# Patient Record
Sex: Female | Born: 1950
Health system: Southern US, Community
[De-identification: ages and names within clinical notes are randomized; demographics above are authoritative.]

## PROBLEM LIST (undated history)

## (undated) DIAGNOSIS — I2699 Other pulmonary embolism without acute cor pulmonale: Secondary | ICD-10-CM

## (undated) DIAGNOSIS — F329 Major depressive disorder, single episode, unspecified: Secondary | ICD-10-CM

## (undated) DIAGNOSIS — H9311 Tinnitus, right ear: Secondary | ICD-10-CM

## (undated) DIAGNOSIS — R112 Nausea with vomiting, unspecified: Secondary | ICD-10-CM

## (undated) DIAGNOSIS — F32A Depression, unspecified: Secondary | ICD-10-CM

## (undated) DIAGNOSIS — Z8709 Personal history of other diseases of the respiratory system: Secondary | ICD-10-CM

## (undated) DIAGNOSIS — T7840XA Allergy, unspecified, initial encounter: Secondary | ICD-10-CM

## (undated) DIAGNOSIS — Z9889 Other specified postprocedural states: Secondary | ICD-10-CM

## (undated) DIAGNOSIS — J383 Other diseases of vocal cords: Secondary | ICD-10-CM

## (undated) DIAGNOSIS — I2510 Atherosclerotic heart disease of native coronary artery without angina pectoris: Secondary | ICD-10-CM

## (undated) DIAGNOSIS — E88819 Insulin resistance, unspecified: Secondary | ICD-10-CM

## (undated) DIAGNOSIS — J849 Interstitial pulmonary disease, unspecified: Secondary | ICD-10-CM

## (undated) DIAGNOSIS — K449 Diaphragmatic hernia without obstruction or gangrene: Secondary | ICD-10-CM

## (undated) DIAGNOSIS — J189 Pneumonia, unspecified organism: Secondary | ICD-10-CM

## (undated) DIAGNOSIS — Z8614 Personal history of Methicillin resistant Staphylococcus aureus infection: Secondary | ICD-10-CM

## (undated) DIAGNOSIS — J679 Hypersensitivity pneumonitis due to unspecified organic dust: Secondary | ICD-10-CM

## (undated) DIAGNOSIS — C4492 Squamous cell carcinoma of skin, unspecified: Secondary | ICD-10-CM

## (undated) DIAGNOSIS — R0609 Other forms of dyspnea: Secondary | ICD-10-CM

## (undated) DIAGNOSIS — K589 Irritable bowel syndrome without diarrhea: Secondary | ICD-10-CM

## (undated) DIAGNOSIS — I341 Nonrheumatic mitral (valve) prolapse: Secondary | ICD-10-CM

## (undated) DIAGNOSIS — E782 Mixed hyperlipidemia: Secondary | ICD-10-CM

## (undated) DIAGNOSIS — R768 Other specified abnormal immunological findings in serum: Secondary | ICD-10-CM

## (undated) DIAGNOSIS — K649 Unspecified hemorrhoids: Secondary | ICD-10-CM

## (undated) DIAGNOSIS — E8881 Metabolic syndrome: Secondary | ICD-10-CM

## (undated) DIAGNOSIS — J8410 Pulmonary fibrosis, unspecified: Secondary | ICD-10-CM

## (undated) DIAGNOSIS — K760 Fatty (change of) liver, not elsewhere classified: Secondary | ICD-10-CM

## (undated) DIAGNOSIS — Z92241 Personal history of systemic steroid therapy: Secondary | ICD-10-CM

## (undated) DIAGNOSIS — K219 Gastro-esophageal reflux disease without esophagitis: Secondary | ICD-10-CM

## (undated) DIAGNOSIS — R002 Palpitations: Principal | ICD-10-CM

## (undated) DIAGNOSIS — R0600 Dyspnea, unspecified: Secondary | ICD-10-CM

## (undated) DIAGNOSIS — M1990 Unspecified osteoarthritis, unspecified site: Secondary | ICD-10-CM

## (undated) DIAGNOSIS — K4090 Unilateral inguinal hernia, without obstruction or gangrene, not specified as recurrent: Secondary | ICD-10-CM

## (undated) DIAGNOSIS — Z8669 Personal history of other diseases of the nervous system and sense organs: Secondary | ICD-10-CM

## (undated) DIAGNOSIS — J45909 Unspecified asthma, uncomplicated: Secondary | ICD-10-CM

## (undated) DIAGNOSIS — I272 Pulmonary hypertension, unspecified: Secondary | ICD-10-CM

## (undated) DIAGNOSIS — I493 Ventricular premature depolarization: Secondary | ICD-10-CM

## (undated) DIAGNOSIS — R000 Tachycardia, unspecified: Secondary | ICD-10-CM

## (undated) DIAGNOSIS — R7303 Prediabetes: Secondary | ICD-10-CM

## (undated) DIAGNOSIS — H269 Unspecified cataract: Secondary | ICD-10-CM

## (undated) DIAGNOSIS — M5134 Other intervertebral disc degeneration, thoracic region: Secondary | ICD-10-CM

## (undated) DIAGNOSIS — K635 Polyp of colon: Secondary | ICD-10-CM

## (undated) DIAGNOSIS — E7800 Pure hypercholesterolemia, unspecified: Secondary | ICD-10-CM

## (undated) HISTORY — DX: Palpitations: R00.2

## (undated) HISTORY — DX: Polyp of colon: K63.5

## (undated) HISTORY — DX: Interstitial pulmonary disease, unspecified: J84.9

## (undated) HISTORY — DX: Diaphragmatic hernia without obstruction or gangrene: K44.9

## (undated) HISTORY — PX: UPPER GI ENDOSCOPY: SHX6162

## (undated) HISTORY — DX: Other specified abnormal immunological findings in serum: R76.8

## (undated) HISTORY — PX: COLONOSCOPY: SHX174

## (undated) HISTORY — DX: Other diseases of vocal cords: J38.3

## (undated) HISTORY — DX: Ventricular premature depolarization: I49.3

## (undated) HISTORY — DX: Pure hypercholesterolemia, unspecified: E78.00

## (undated) HISTORY — DX: Unspecified cataract: H26.9

## (undated) HISTORY — PX: OTHER SURGICAL HISTORY: SHX169

## (undated) HISTORY — DX: Other forms of dyspnea: R06.09

## (undated) HISTORY — DX: Dyspnea, unspecified: R06.00

## (undated) HISTORY — DX: Personal history of other diseases of the respiratory system: Z87.09

## (undated) HISTORY — DX: Metabolic syndrome: E88.81

## (undated) HISTORY — PX: TUBAL LIGATION: SHX77

## (undated) HISTORY — DX: Insulin resistance, unspecified: E88.819

## (undated) HISTORY — DX: Allergy, unspecified, initial encounter: T78.40XA

## (undated) HISTORY — DX: Hypersensitivity pneumonitis due to unspecified organic dust: J67.9

## (undated) HISTORY — DX: Unspecified asthma, uncomplicated: J45.909

## (undated) HISTORY — DX: Unilateral inguinal hernia, without obstruction or gangrene, not specified as recurrent: K40.90

## (undated) HISTORY — PX: CATARACT EXTRACTION: SUR2

## (undated) HISTORY — DX: Mixed hyperlipidemia: E78.2

## (undated) HISTORY — DX: Tachycardia, unspecified: R00.0

## (undated) HISTORY — PX: FOOT FRACTURE SURGERY: SHX645

## (undated) HISTORY — DX: Other pulmonary embolism without acute cor pulmonale: I26.99

## (undated) HISTORY — DX: Atherosclerotic heart disease of native coronary artery without angina pectoris: I25.10

## (undated) HISTORY — PX: SQUAMOUS CELL CARCINOMA EXCISION: SHX2433

## (undated) SURGERY — BRONCHOSCOPY, WITH FLUOROSCOPY
Anesthesia: Moderate Sedation | Laterality: Bilateral

---

## 1999-03-27 ENCOUNTER — Other Ambulatory Visit: Admission: RE | Admit: 1999-03-27 | Discharge: 1999-03-27 | Payer: Self-pay | Admitting: Obstetrics and Gynecology

## 1999-11-25 ENCOUNTER — Encounter: Payer: Self-pay | Admitting: Internal Medicine

## 1999-11-25 ENCOUNTER — Ambulatory Visit (HOSPITAL_COMMUNITY): Admission: RE | Admit: 1999-11-25 | Discharge: 1999-11-25 | Payer: Self-pay | Admitting: Internal Medicine

## 2000-04-08 ENCOUNTER — Other Ambulatory Visit: Admission: RE | Admit: 2000-04-08 | Discharge: 2000-04-08 | Payer: Self-pay | Admitting: Obstetrics and Gynecology

## 2001-05-12 ENCOUNTER — Other Ambulatory Visit: Admission: RE | Admit: 2001-05-12 | Discharge: 2001-05-12 | Payer: Self-pay | Admitting: Obstetrics and Gynecology

## 2001-11-23 HISTORY — PX: BREAST BIOPSY: SHX20

## 2002-07-17 ENCOUNTER — Other Ambulatory Visit: Admission: RE | Admit: 2002-07-17 | Discharge: 2002-07-17 | Payer: Self-pay | Admitting: Obstetrics and Gynecology

## 2002-08-30 ENCOUNTER — Encounter: Admission: RE | Admit: 2002-08-30 | Discharge: 2002-08-30 | Payer: Self-pay | Admitting: General Surgery

## 2002-08-30 ENCOUNTER — Encounter (INDEPENDENT_AMBULATORY_CARE_PROVIDER_SITE_OTHER): Payer: Self-pay | Admitting: Specialist

## 2002-08-30 ENCOUNTER — Encounter: Payer: Self-pay | Admitting: General Surgery

## 2002-09-06 ENCOUNTER — Ambulatory Visit: Admission: RE | Admit: 2002-09-06 | Discharge: 2002-09-06 | Payer: Self-pay | Admitting: Pulmonary Disease

## 2002-09-18 ENCOUNTER — Ambulatory Visit: Admission: RE | Admit: 2002-09-18 | Discharge: 2002-09-18 | Payer: Self-pay | Admitting: Pulmonary Disease

## 2002-09-21 ENCOUNTER — Encounter: Payer: Self-pay | Admitting: Pulmonary Disease

## 2002-09-21 ENCOUNTER — Ambulatory Visit (HOSPITAL_COMMUNITY): Admission: RE | Admit: 2002-09-21 | Discharge: 2002-09-21 | Payer: Self-pay | Admitting: Pulmonary Disease

## 2003-02-06 ENCOUNTER — Emergency Department (HOSPITAL_COMMUNITY): Admission: EM | Admit: 2003-02-06 | Discharge: 2003-02-06 | Payer: Self-pay | Admitting: Emergency Medicine

## 2003-02-06 ENCOUNTER — Encounter: Payer: Self-pay | Admitting: Emergency Medicine

## 2003-10-01 ENCOUNTER — Encounter: Admission: RE | Admit: 2003-10-01 | Discharge: 2003-10-01 | Payer: Self-pay | Admitting: Obstetrics and Gynecology

## 2003-10-15 ENCOUNTER — Other Ambulatory Visit: Admission: RE | Admit: 2003-10-15 | Discharge: 2003-10-15 | Payer: Self-pay | Admitting: Obstetrics and Gynecology

## 2003-11-21 ENCOUNTER — Emergency Department (HOSPITAL_COMMUNITY): Admission: EM | Admit: 2003-11-21 | Discharge: 2003-11-21 | Payer: Self-pay | Admitting: Emergency Medicine

## 2004-08-07 ENCOUNTER — Inpatient Hospital Stay (HOSPITAL_COMMUNITY): Admission: EM | Admit: 2004-08-07 | Discharge: 2004-08-10 | Payer: Self-pay | Admitting: Emergency Medicine

## 2004-11-20 ENCOUNTER — Encounter: Admission: RE | Admit: 2004-11-20 | Discharge: 2004-11-20 | Payer: Self-pay | Admitting: Obstetrics and Gynecology

## 2004-11-28 ENCOUNTER — Ambulatory Visit: Payer: Self-pay | Admitting: Cardiology

## 2005-07-08 ENCOUNTER — Ambulatory Visit (HOSPITAL_BASED_OUTPATIENT_CLINIC_OR_DEPARTMENT_OTHER): Admission: RE | Admit: 2005-07-08 | Discharge: 2005-07-08 | Payer: Self-pay | Admitting: Obstetrics and Gynecology

## 2005-07-08 ENCOUNTER — Ambulatory Visit (HOSPITAL_COMMUNITY): Admission: RE | Admit: 2005-07-08 | Discharge: 2005-07-08 | Payer: Self-pay | Admitting: Obstetrics and Gynecology

## 2005-11-23 DIAGNOSIS — K635 Polyp of colon: Secondary | ICD-10-CM

## 2005-11-23 HISTORY — DX: Polyp of colon: K63.5

## 2006-01-12 ENCOUNTER — Encounter: Admission: RE | Admit: 2006-01-12 | Discharge: 2006-01-12 | Payer: Self-pay | Admitting: Obstetrics and Gynecology

## 2006-02-02 ENCOUNTER — Encounter: Admission: RE | Admit: 2006-02-02 | Discharge: 2006-02-02 | Payer: Self-pay | Admitting: Obstetrics and Gynecology

## 2006-10-27 ENCOUNTER — Ambulatory Visit: Payer: Self-pay | Admitting: Internal Medicine

## 2006-11-11 ENCOUNTER — Encounter: Payer: Self-pay | Admitting: Internal Medicine

## 2006-11-11 ENCOUNTER — Ambulatory Visit: Payer: Self-pay | Admitting: Internal Medicine

## 2007-02-10 ENCOUNTER — Encounter: Admission: RE | Admit: 2007-02-10 | Discharge: 2007-02-10 | Payer: Self-pay | Admitting: Obstetrics and Gynecology

## 2007-11-24 HISTORY — PX: BREAST BIOPSY: SHX20

## 2008-02-27 ENCOUNTER — Encounter: Admission: RE | Admit: 2008-02-27 | Discharge: 2008-02-27 | Payer: Self-pay | Admitting: Family Medicine

## 2008-03-08 ENCOUNTER — Encounter: Admission: RE | Admit: 2008-03-08 | Discharge: 2008-03-08 | Payer: Self-pay | Admitting: Family Medicine

## 2009-03-25 ENCOUNTER — Encounter: Admission: RE | Admit: 2009-03-25 | Discharge: 2009-03-25 | Payer: Self-pay | Admitting: Family Medicine

## 2009-10-15 ENCOUNTER — Encounter: Admission: RE | Admit: 2009-10-15 | Discharge: 2009-10-15 | Payer: Self-pay | Admitting: Internal Medicine

## 2010-03-27 DIAGNOSIS — I080 Rheumatic disorders of both mitral and aortic valves: Secondary | ICD-10-CM | POA: Insufficient documentation

## 2010-03-27 DIAGNOSIS — R0989 Other specified symptoms and signs involving the circulatory and respiratory systems: Secondary | ICD-10-CM

## 2010-03-27 DIAGNOSIS — F329 Major depressive disorder, single episode, unspecified: Secondary | ICD-10-CM | POA: Insufficient documentation

## 2010-03-27 DIAGNOSIS — Z8679 Personal history of other diseases of the circulatory system: Secondary | ICD-10-CM | POA: Insufficient documentation

## 2010-03-27 DIAGNOSIS — R0609 Other forms of dyspnea: Secondary | ICD-10-CM | POA: Insufficient documentation

## 2010-04-02 ENCOUNTER — Ambulatory Visit: Payer: Self-pay | Admitting: Cardiology

## 2010-04-02 DIAGNOSIS — E785 Hyperlipidemia, unspecified: Secondary | ICD-10-CM | POA: Insufficient documentation

## 2010-04-02 DIAGNOSIS — R9431 Abnormal electrocardiogram [ECG] [EKG]: Secondary | ICD-10-CM | POA: Insufficient documentation

## 2010-04-23 ENCOUNTER — Telehealth (INDEPENDENT_AMBULATORY_CARE_PROVIDER_SITE_OTHER): Payer: Self-pay | Admitting: *Deleted

## 2010-04-24 ENCOUNTER — Ambulatory Visit: Payer: Self-pay

## 2010-04-24 ENCOUNTER — Ambulatory Visit: Payer: Self-pay | Admitting: Internal Medicine

## 2010-04-24 ENCOUNTER — Encounter (HOSPITAL_COMMUNITY): Admission: RE | Admit: 2010-04-24 | Discharge: 2010-06-24 | Payer: Self-pay | Admitting: Cardiology

## 2010-05-19 ENCOUNTER — Encounter: Admission: RE | Admit: 2010-05-19 | Discharge: 2010-05-19 | Payer: Self-pay | Admitting: Family Medicine

## 2010-12-23 NOTE — Progress Notes (Signed)
Summary: Nuclear Pre-Procedure  Phone Note Outgoing Call   Call placed by: Milana Na, EMT-P,  April 23, 2010 2:21 PM Summary of Call: Reviewed information on Myoview Information Sheet (see scanned document for further details).  Busy response.     Nuclear Med Background Indications for Stress Test: Evaluation for Ischemia, Abnormal EKG   History: CT/MRI, Echo, Myocardial Perfusion Study, MVP  History Comments: '03 MPS NL EF > 70% '03 ECHO Mild MR 11/10 CT: (-) PE  Symptoms: DOE    Nuclear Pre-Procedure Cardiac Risk Factors: Lipids Height (in): 61.5  Nuclear Med Study Referring MD:  T.Wall

## 2010-12-23 NOTE — Assessment & Plan Note (Signed)
Summary: EC6/EKG,Dysnea exertion,eval for echo-mb   Visit Type:  new pt visit Primary Provider:  Nadyne Coombes  CC:  dyspnea on exertion..some chest discomfort but states she also has reflux...denies any edema.  History of Present Illness: Rhonda [name] comes in today for evaluation of increased dyspnea on exertion and cardiac risk factors.  She is referred by Dr. Nadyne Coombes.  Over the last several months, she's had increased dyspnea on exertion with walking or lifting items. She had some problems with breathing and congestion back in November of 2010. At that time a chest CT was obtained which was negative for pulmonary embolus and no other disease was noted.  She had dyspnea on exertion 2003 underwent a stress Myoview in our office. Was negative for any ischemia. Specifically, she has good exercise tolerance, adequate blood pressure response, no EKG changes to suggest any ischemia, EF was 70% with normal perfusion.  There is also a history of mitral valve thickening with mild mitral regurgitation. Last echo was in August of 2003.  She denies orthopnea, PND or peripheral edema. Her dyspnea on exertion does not occur when she swims which is remarkable.  She has risk factors of age, mixed hyperlipidemia, obesity.   Current Medications (verified): 1)  Metoprolol Succinate 50 Mg Xr24h-Tab (Metoprolol Succinate) .Marland Kitchen.. 1 Tab Once Daily 2)  Prozac 20 Mg Caps (Fluoxetine Hcl) .Marland Kitchen.. 1 Tab Once Daily 3)  Simvastatin 40 Mg Tabs (Simvastatin) .Marland Kitchen.. 1 Tab At Bedtime 4)  Singulair 10 Mg Tabs (Montelukast Sodium) .Marland Kitchen.. 1 Tab At Bedtime 5)  Omeprazole 20 Mg Tbec (Omeprazole) .Marland Kitchen.. 1 Tab Once Daily 6)  Levocetirizine Dihydrochloride 5 Mg Tabs (Levocetirizine Dihydrochloride) .Marland Kitchen.. 1 Tab As Needed 7)  Pravastatin Sodium 40 Mg Tabs (Pravastatin Sodium) .Marland Kitchen.. 1 Tab At Bedtime 8)  Asmanex 60 Metered Doses 220 Mcg/inh Aepb (Mometasone Furoate) .... As Needed 9)  Proair Hfa 108 (90 Base) Mcg/act Aers  (Albuterol Sulfate) .... As Needed 10)  Dry Eye Relief .... Use As Directed  Allergies (verified): 1)  ! Betadine 2)  ! Codeine 3)  ! Sulfa 4)  ! Crestor (Rosuvastatin Calcium) 5)  ! Lipitor (Atorvastatin)  Past History:  Past Medical History: Last updated: 03/27/2010 DYSPNEA ON EXERTION (ICD-786.09) TACHYCARDIA, HX OF (ICD-V12.50) MITRAL VALVE PROLAPSE, HX OF (ICD-V12.50) MITRAL REGURGITATION (ICD-396.3) HYPERTRIGLYCERIDEMIA (ICD-272.1) DEPRESSION (ICD-311)    Past Surgical History: Last updated: 03/27/2010 Open reduction, internal fixation of lateral malleolus and  repair of deltoid ligament. SURGEON:  Claude Manges. Cleophas Dunker, MD  Review of Systems       negative other than history of present illness  Vital Signs:  Patient profile:   60 year old female Height:      61.5 inches Weight:      163 pounds BMI:     30.41 Pulse rate:   66 / minute Pulse rhythm:   irregular BP sitting:   110 / 70  (left arm) Cuff size:   large  Vitals Entered By: Danielle Rankin, CMA (Apr 02, 2010 2:32 PM)  Physical Exam  General:  obese.  obese.   Head:  normocephalic and atraumatic Eyes:  PERRLA/EOM intact; conjunctiva and lids normal. Mouth:  Teeth, gums and palate normal. Oral mucosa normal. Neck:  Neck supple, no JVD. No masses, thyromegaly or abnormal cervical nodes. Chest Theus Espin:  no deformities or breast masses noted Lungs:  inspiratory rhonchi that clear with cough. Heart:  PMI nondisplaced, normal S1-S2, no obvious click, soft systolic murmur at the apex. Abdomen:  Bowel sounds  positive; abdomen soft and non-tender without masses, organomegaly, or hernias noted. No hepatosplenomegaly. Msk:  Back normal, normal gait. Muscle strength and tone normal. Pulses:  pulses normal in all 4 extremities Extremities:  No clubbing or cyanosis. Neurologic:  Alert and oriented x 3. Skin:  Intact without lesions or rashes. Psych:  Normal affect.   Problems:  Medical Problems Added: 1)  Dx of  Abnormal Ekg  (ICD-794.31) 2)  Dx of Hyperlipidemia-mixed  (ICD-272.4)  EKG  Procedure date:  04/02/2010  Findings:      normal sinus rhythm, ST depression with biphasic T waves in 23 aVF V3 through V5. These changes are new.  Impression & Recommendations:  Problem # 1:  DYSPNEA ON EXERTION (ICD-786.09) Assessment Deteriorated  Her symptoms are worse than they were in the past. She has an abnormal EKG which is new. She also has several cardiac risk factors it put her in the low to medium coronary risk score. We will perform a stress Myoview. The following medications were removed from the medication list:    Aspirin Ec 325 Mg Tbec (Aspirin) .Marland Kitchen... Take one tablet by mouth daily Her updated medication list for this problem includes:    Metoprolol Succinate 50 Mg Xr24h-tab (Metoprolol succinate) .Marland Kitchen... 1 tab once daily  Orders: Nuclear Stress Test (Nuc Stress Test)  Problem # 2:  MITRAL VALVE PROLAPSE, HX OF (ICD-V12.50) Assessment: Unchanged  Problem # 3:  MITRAL REGURGITATION (ICD-396.3) Assessment: Unchanged  Problem # 4:  HYPERLIPIDEMIA-MIXED (ICD-272.4)  The following medications were removed from the medication list:    Simvastatin 40 Mg Tabs (Simvastatin) .Marland Kitchen... 1 tab at bedtime Her updated medication list for this problem includes:    Pravastatin Sodium 40 Mg Tabs (Pravastatin sodium) .Marland Kitchen... 1 tab at bedtime  Problem # 5:  ABNORMAL EKG (ICD-794.31) Assessment: New  she That has ST segment depression with T wave inversion in 3 and aVF as well as V3 through V5 which is new since her last EKG. Couple with her dyspnea on exertion, cardiac risk factors, and these changes I think she certainly needs at least a nuclear stress study. The following medications were removed from the medication list:    Aspirin Ec 325 Mg Tbec (Aspirin) .Marland Kitchen... Take one tablet by mouth daily Her updated medication list for this problem includes:    Metoprolol Succinate 50 Mg Xr24h-tab (Metoprolol  succinate) .Marland Kitchen... 1 tab once daily  Orders: EKG w/ Interpretation (93000)  Patient Instructions: 1)  Your physician recommends that you schedule a follow-up appointment in: AS NEEDED 2)  Your physician recommends that you continue on your current medications as directed. Please refer to the Current Medication list given to you today. 3)  Your physician has requested that you have an exercise stress myoview.  For further information please visit [email or web address]  Please follow instruction sheet, as given.

## 2010-12-23 NOTE — Assessment & Plan Note (Signed)
Summary: Cardiology Nuclear Study  Nuclear Med Background Indications for Stress Test: Evaluation for Ischemia, Abnormal EKG   History: CT/MRI, Echo, Myocardial Perfusion Study, MVP  History Comments: '03 MPS NL EF > 70% '03 ECHO Mild MR 11/10 CT: (-) PE  Symptoms: DOE, Palpitations    Nuclear Pre-Procedure Cardiac Risk Factors: Lipids Caffeine/Decaff Intake: None NPO After: 9:00 PM Lungs: clear IV 0.9% NS with Angio Cath: 20g     IV Site: (R) AC IV Started by: Irean Hong RN Chest Size (in) 38     Cup Size B     Height (in): 61.5 Weight (lb): 158 BMI: 29.48 Tech Comments: This patient was scheduled to walk the treadmill, and she did. She had a difficult time with shortness of breath and had to be switched to [employer].   Nuclear Med Study 1 or 2 day study:  1 day     Stress Test Type:  Eugenie Birks Reading MD:  Arvilla Meres, MD     Referring MD:  T.Wall Resting Radionuclide:  Technetium 71m Tetrofosmin     Resting Radionuclide Dose:  11 mCi  Stress Radionuclide:  Technetium 46m Tetrofosmin     Stress Radionuclide Dose:  33 mCi   Stress Protocol Exercise Time (min):  6:45 min     Max HR:  122 bpm     Predicted Max HR:  159 bpm  Max Systolic BP: 151 mm Hg     Percent Max HR:  76.73 %     METS: 8.10 Rate Pressure Product:  14782  Lexiscan: 0.4 mg   Stress Test Technologist:  Milana Na EMT-P     Nuclear Technologist:  Domenic Polite CNMT  Rest Procedure  Myocardial perfusion imaging was performed at rest 45 minutes following the intravenous administration of Myoview Technetium 93m Tetrofosmin.  Stress Procedure  The patient received IV Lexiscan 0.4 mg over 15-seconds.  Myoview injected at 30-seconds.  There were non specific changes with infusion.  Quantitative spect images were obtained after a 45 minute delay.  QPS Raw Data Images:  Normal; no motion artifact; normal heart/lung ratio. Stress Images:  There is normal uptake in all areas. Rest Images:  Normal  homogeneous uptake in all areas of the myocardium. Subtraction (SDS):  Normal Transient Ischemic Dilatation:  1  (Normal <1.22)  Lung/Heart Ratio:  .41  (Normal <0.45)  Quantitative Gated Spect Images QGS EDV:  57 ml QGS ESV:  14 ml QGS EF:  76 % QGS cine images:  Normal  Findings Normal nuclear study      Overall Impression  Exercise Capacity: Lexiscan study with no exercise. ECG Impression: Baseline: NSR; No significant ST segment change with.Lexiscan Overall Impression: Normal stress nuclear study. Overall Impression Comments: Patient walked on treadmill for 6:45 seconds. + Dyspnea. Unable to reach target HR so switched to Lexiscan.  Appended Document: Cardiology Nuclear Study Good. Dyspnea not cardiac.   Appended Document: Cardiology Nuclear Study PT AWARE./CY

## 2011-03-26 ENCOUNTER — Emergency Department (HOSPITAL_COMMUNITY)
Admission: EM | Admit: 2011-03-26 | Discharge: 2011-03-26 | Disposition: A | Discharge status: Home / Self Care | Payer: No Typology Code available for payment source | Attending: Emergency Medicine | Admitting: Emergency Medicine

## 2011-03-26 DIAGNOSIS — M25519 Pain in unspecified shoulder: Secondary | ICD-10-CM | POA: Insufficient documentation

## 2011-03-26 DIAGNOSIS — IMO0002 Reserved for concepts with insufficient information to code with codable children: Secondary | ICD-10-CM | POA: Insufficient documentation

## 2011-03-26 DIAGNOSIS — E785 Hyperlipidemia, unspecified: Secondary | ICD-10-CM | POA: Insufficient documentation

## 2011-03-26 DIAGNOSIS — Z79899 Other long term (current) drug therapy: Secondary | ICD-10-CM | POA: Insufficient documentation

## 2011-03-26 DIAGNOSIS — T07XXXA Unspecified multiple injuries, initial encounter: Secondary | ICD-10-CM | POA: Insufficient documentation

## 2011-04-10 NOTE — Consult Note (Signed)
NAME:  Eichelberger, Kalii A                       ACCOUNT NO.:  [id number]   MEDICAL RECORD NO.:  [id number]                   PATIENT TYPE:  OUT   LOCATION:  [facility]                                  FACILITY:  [facility]   PHYSICIAN:  Willa Rough, M.D.                  DATE OF BIRTH:  04-09-1951   DATE OF CONSULTATION:  DATE OF DISCHARGE:                                   CONSULTATION   REFERRING PHYSICIAN:  Claude Manges. Cleophas Dunker, M.D.   REASON FOR CONSULTATION:  The patient is a 60 year old female, with no known  history of coronary artery disease, remote history of tachycardia, with  normal left ventricular function by echocardiogram  in 2003, now referred  for evaluation of abnormal electrocardiogram.  The patient is status post  right ankle fracture and is scheduled for surgery later today.   The patient denies any prior history of exertional chest discomfort.  She  does, however, have chronic exertional dyspnea but no orthopnea, PND, or  lower extremity edema.  She underwent full evaluation by Dr. Sung Amabile in  2003, after presenting status post syncope and for evaluation of progressive  dyspnea.  She was referred for an echocardiogram  which revealed normal left  ventricular function and mild mitral regurgitation with mild mitral valve  prolapse.  She also underwent pulmonary function testing and was The patient  tolerated the procedure by Dr. Sung Amabile that essentially everything was  negative.   Regarding her history of palpitations, these have been well controlled over  the past 10 years or so on low dose metoprolol.  Regarding her remote  episode, she was evaluated by Dr. Aggie Cosier who concluded that this was  pregnancy-induced.   The preoperative electrocardiogram did suggest a poor R-wave progression.   ALLERGIES:  1.  CODEINE.  2.  SULFA.  3.  IODINE.  4.  BETADINE.   MEDICATIONS PRIOR TO ADMISSION:  1.  Metoprolol 25 mg daily.  2.  Crestor 10 mg daily.  3.  Prozac 20 mg  daily.  4.  Singulair 10 mg q.h.s.   PAST MEDICAL HISTORY:  1.  Remote tachycardia.  2.  Status post syncopal episode with negative workup.  3.  Normal left ventricular function.  4.  Mild mitral regurgitation.  5.  Hypertriglyceridemia.  6.  Question asthma.  7.  Depression.  8.  Status post C. section in 1985.  9.  Tubal ligation in 1989.   SOCIAL HISTORY:  The patient lives in [place], [place], with her  husband.  They have one child.  She is a Doctor, general practice.  The patient  has nerve smoked tobacco and drinks alcohol only on rare occasion.   FAMILY HISTORY:  Father deceased age 87, secondary to cancer.  Mother age  72, resides in a nursing home and has dementia.  Sister, age 42, history of  hypertension.  There is no family history of coronary  artery disease.   REVIEW OF SYMPTOMS:  As noted in HPI.  The patient does have occasional  tinnitus.  Also occasional heartburn.  Remaining systems negative.   PHYSICAL EXAMINATION:  GENERAL APPEARANCE:  A 60 year old female in no  apparent distress.  VITAL SIGNS:  Blood pressure 115/66, pulse 78 and regular, respiratory rate  18, temperature 98.6, SAO2 96 (room air).  HEENT:  Normocephalic and atraumatic.  Xanthelasma noted.  LUNGS:  Clear to auscultation in all fields.  CARDIOVASCULAR:  Regular rate and rhythm (S1 and S2).  No murmurs, rubs, or  gallops.  ABDOMEN:  Soft, nontender with intact bowel sounds.  EXTREMITIES:  Preserved right popliteal with lower extremity bandage;  preserved distal pulses with no edema on the left.  NEUROLOGIC:  No focal deficit.   Preoperative electrocardiogram reveals normal sinus rhythm at 70 beats per  minute with normal axis and poor R-wave progression with a small R-wave in  V1 and absence of V2.  Repeat electrocardiogram, after repositioning of  leads, reveals normal R-wave progression with nonspecific ST abnormalities  (noted in 2003).   IMPRESSION:  A 60 year old female, with  no known history of coronary artery  disease, normal left ventricular function by echocardiogram  in 2003, who is  now status post right ankle fracture following a fall, and is referred for  preoperative cardiac clearance.  Initial electrocardiogram  suggested poor R-  wave by transition and suggestion of a previous septal infarct.  However,  repeat electrocardiogram, following repositioning of the leads, shows normal  R-wave progression and old, nonspecific ST abnormalities noted on previous  electrocardiogram.   More over, the patient presents with no recent or current symptoms  suggestive of unstable angina pectoris or congestive heart failure.   PLAN:  The patient is cleared to proceed with surgery later today, and no  further cardiac recommendations or work-up is warranted at this time. The  patient was seen and examined in conjunction with Dr. Willa Rough.     Gene Serpe, P.A. [facility]                      Willa Rough, M.D.    GS/MEDQ  D:  08/08/2004  T:  08/08/2004  Job:  [id number]

## 2011-04-10 NOTE — Op Note (Signed)
NAME:  Hlavac, Gissela             ACCOUNT NO.:  [id number]   MEDICAL RECORD NO.:  [id number]          PATIENT TYPE:  AMB   LOCATION:  [facility]                         FACILITY:  [facility]   PHYSICIAN:  Sherry A. Dickstein, M.D.DATE OF BIRTH:  01-14-1951   DATE OF PROCEDURE:  07/08/2005  DATE OF DISCHARGE:                                 OPERATIVE REPORT   PREOPERATIVE DIAGNOSES:  Tight hymenal band.   POSTOPERATIVE DIAGNOSES:  Tight hymenal band.   PROCEDURE:  Hymenotomy.   SURGEON:  Sherry A. Rosalio Macadamia, M.D.   ANESTHESIA:  General.   INDICATIONS FOR PROCEDURE:  This is a 60 year old, G1, P1-0-0-1 woman who  has had significant dyspareunia which has been getting worse since she has  been postmenopausal. The patient has a very tight hymenal band which has  been causing significant pain. Because of the discomfort and the tightening  of the hymenal band, the patient requests surgical intervention and the  patient is brought to the operating room for hymenotomy.   FINDINGS:  Very tight intact hymenal band.   DESCRIPTION OF PROCEDURE:  The patient is brought into the operating room  and given adequate general anesthesia. She was placed in the dorsal  lithotomy position, her perineum and vagina were washed with Hibiclens. The  patient was draped in a sterile fashion. The introitus was injected with  0.25% Marcaine with epinephrine circumferentially around the hymenal band.  Using double clamps, hemostats at 2o'clock, 4 o'clock, 8 o'clock and 10  o'clock, the hemostats were clamped, cut, suture ligated with 3-0 chromic  and then any further clamping, cutting and suturing was performed to assure  that the hymenal band would open adequately. Small bleeders were cauterized.  The base of the incised areas were then infiltrated with 0.25% Marcaine with  epinephrine. Small areas were also clamped and cut and suture ligated for  further opening. Approximately 2-3 fingers were able to be inserted  after  this procedure. Estrace cream was then inserted around the introitus,  adequate hemostasis was present. The patient was taken out of the dorsal  lithotomy position, she was awakened. She was extubated, she was moved from  the operating table to a stretcher in stable condition. Complications were  none. Estimated blood loss less than 5 mL.      Sherry A. Rosalio Macadamia, M.D.  Electronically Signed     SAD/MEDQ  D:  07/08/2005  T:  07/08/2005  Job:  [id number]

## 2011-04-10 NOTE — Discharge Summary (Signed)
Rhonda [name], Rhonda [name]             ACCOUNT NO.:  [id number]   MEDICAL RECORD NO.:  [id number]          PATIENT TYPE:  INP   LOCATION:  5033                         FACILITY:  [facility]   PHYSICIAN:  Claude Manges. Whitfield, M.D.DATE OF BIRTH:  03/05/1951   DATE OF ADMISSION:  08/07/2004  DATE OF DISCHARGE:  08/10/2004                                 DISCHARGE SUMMARY   ADMISSION DIAGNOSES:  1.  Right ankle bimalleolar fracture.  2.  History of tachycardia.  3.  History of mitral valve prolapse.   DISCHARGE DIAGNOSES:  1.  Right ankle open reduction and internal fixation.  2.  History of remote cardiac tachycardia.  3.  History of status post syncopal episode with a negative workup.  4.  Normal left ventricular function.  5.  Mild mitral valve regurgitation.  6.  Hypertriglyceridemia.  7.  Depression.   HISTORY OF PRESENT ILLNESS:  The patient is a 60 year old female who, on  date of admission, was out walking around at her house when she missed a  step, twisting her ankle, falling to the ground, having pain and deformity  of her right ankle.  She was brought to initially [facility] ER for  evaluation and was found to have a bimalleolar ankle fracture.  Due to  inaccessibility to the OR for surgical care and possible delay, the patient  was transferred to [facility] for ORIF.  The patient was also  preoperatively evaluated from the cardiac standpoint and cleared.   CURRENT MEDICATIONS:  1.  Toprol 25 mg p.o. daily.  2.  Crestor 10 mg p.o. daily.  3.  Prozac 20 mg p.o. daily.  4.  Singulair 10 mg p.o. nightly.   ALLERGIES:  1.  BETADINE.  2.  IODINE.  3.  SULFA.  4.  CODEINE.   CONSULTS:  The following routine consults were requested:  1.  Physical therapy.  2.  Occupational therapy.  3.  A cardiology consult by [facility] was performed for a      preoperative cardiac clearance.   HOSPITAL COURSE:  On August 07, 2004, the patient was seen in the [facility] Emergency Room for evaluation of her right ankle fracture.  The patient  was found to have a bimalleolar fracture.  The patient was to be admitted  and due to unavailability of OR time and the lack of delay, the patient was  transferred to [facility] for surgical ORIF.  The patient was also  preoperatively evaluated by cardiology services with [place] for cardiac  clearance.  The patient was cleared and the patient was then taken to the OR  for an ORIF of her right ankle.   The patient then incurred 2 days of postoperative care on the orthopedic  floor in which the patient had no untoward events, no complications, no  cardiac complications.  Her vital signs remained stable.  She remained  afebrile.  Her right lower extremity pain was well-controlled with p.o. pain  medications.  Her toes remained neuromotor and vascular intact and the  patient worked well with physical therapy.  It was  felt on postop day #2,  after working with physical therapy, the patient was ready for discharge to  home, so she was discharged home in good condition.   SURGICAL PROCEDURE:  On August 08, 2004, the patient was taken to the OR  by Dr. Claude Manges. Whitfield, assisted by Richardean Canal, P.A., and had a  trimalleolar fracture dislocation of the right ankle repaired with an open  reduction and internal fixation.  The patient tolerated the procedure well.  It was done under general anesthesia with peroneal nerve block, no  complications.  The patient was transferred to the orthopedic floor in good  condition.   LABORATORIES:  EKG on admission showed normal sinus rhythm, questionable  septal infarct, age undetermined, possible lateral infarct, age  undetermined, abnormal EKG at 70 beats per minute with a PRT axes of 39, 37  and 50.   Chest x-ray showed no active disease.   Three-view right ankle shows trimalleolar fracture with dislocation of the  ankle.   CBC on admission showed WBC 12.9,  hemoglobin 14.8, hematocrit 43.7,  platelets 234,000.  INR of 0.9.  Routine chemistries:  Sodium 139, potassium  of 3.8, glucose of 118, BUN 13, creatinine 1.0.   MEDICATIONS UPON DISCHARGE FROM ORTHOPEDIC FLOOR:  1.  Patient was initially managed on a Dilaudid PCA, but transitioned over      to p.o. medications.  2.  Lopressor 25 mg p.o. daily.  3.  Prozac 20 mg p.o. daily.  4.  Zocor 40 mg p.o. daily.  5.  Singulair 10 mg p.o. nightly.  6.  Phenergan 25 mg p.o. or IV q.6 h. p.r.n.  7.  Tylenol 650 mg p.o. q.6 h. p.r.n.  8.  Percocet 1 or 2 tablets every 4-6 hours p.r.n.  9.  Benadryl 25 mg to 50 mg p.o. q.6 h. p.r.n. itching.   DISCHARGE INSTRUCTIONS:   MEDICATIONS:  The patient is to resume routine home medications with the  addition of the following:  1.  Percocet 7.5 mg 1 or 2 tablets every 4-6 hours p.r.n. pain.  2.  Aspirin 325 mg once a day for 30 days.   ACTIVITY:  The patient is to nonweightbearing on the right leg with the use  of a walker.   DIET:  No restrictions.   WOUND CARE:  Keep the splint clean and dry.   FOLLOWUP:  The patient is to have a followup appointment with Dr. Cleophas Dunker  in his office in approximately 5 days; please call [phone] for followup  appointment.   CONDITION UPON DISCHARGE:  The patient's condition upon discharge to home is  listed as improved and good.       RWK/MEDQ  D:  09/25/2004  T:  09/25/2004  Job:  [id number]

## 2011-04-10 NOTE — Op Note (Signed)
NAME:  Rhonda [name], Rhonda [name]                       ACCOUNT NO.:  [id number]   MEDICAL RECORD NO.:  [id number]                   PATIENT TYPE:  OIB   LOCATION:  5033                                 FACILITY:  [facility]   PHYSICIAN:  Claude Manges. Cleophas Dunker, M.D.            DATE OF BIRTH:  05-12-1951   DATE OF PROCEDURE:  DATE OF DISCHARGE:  08/10/2004                                 OPERATIVE REPORT   DATE OF OPERATION:  August 08, 2004.   PREOPERATIVE DIAGNOSIS:  Displaced trimalleolar fracture/dislocation, right  ankle.   POSTOPERATIVE DIAGNOSIS:  Displaced trimalleolar fracture/dislocation, right  ankle.   PROCEDURE:  Open reduction, internal fixation of lateral malleolus and  repair of deltoid ligament.   SURGEON:  Claude Manges. Cleophas Dunker, MD.   ASSISTANT:  Richardean Canal, PA-C.   ANESTHESIA:  General with peroneal nerve block.   COMPLICATIONS:  None.   PROCEDURE:  With the patient comfortable on the operating table with [name]  peroneal nerve block and initially with IV sedation, the right lower  extremity was placed in [name] thigh tourniquet.  The leg was then prepped with  Hibiclens, as the patient is ALLERGIC TO DURAPREP, from the tips of the toes  to the tourniquet.  Sterile draping was performed.  With the extremity still  elevated, it was esmarched and exsanguinated over the proximal tourniquet at  350 mmHg.  The patient was uncomfortable at that point.  Accordingly,  general anesthesia was instituted.   X-rays revealed [name] posterior dislocation of the ankle joint in relationship  to the distal tibia.  There was [name] very small fragment of posterior plafond,  and just an area of calcification of the deltoid ligament tear from the  medial malleolus, there was considerable displacement of [name] lateral malleolus  fracture.   Initial procedure involved [name] longitudinal incision over the lateral  malleolus about 3 inches in length.  Via sharp dissection, incision carried  down through the  subcutaneous tissue.  Then via blunt dissection, the soft  tissue was elevated off the tibia.  There was abundant hematoma that was  irrigated.  Under direct visualization, the lateral malleolus fracture was  reduced.  The fracture extended from proximally and posteriorly to  anteriorly and distally.  The fracture was above the joint line.  Under  direct visualization, the fracture was reduced and maintained with [name] bone  clamp.  [name] single 3.5 mm cortical screw was then placed at [name] 9 degree angle  of the fracture.  After appropriate drilling, measuring, and filling with  self-tapping cortical screw, there was [name] very nice purchase.  The bone clamp  was maintained in place.  [name] 7-hole semi-tubular Synthes plate was then bent  to fashion to the lateral malleolus.  There were 4 screw holes above the  fracture and 3 distal.  Each of the 4 holes proximally were drilled,  measured, and filled with self-tapping cortical screws.  In [name] similar  fashion, the distal 3 holes were drilled, measured, and filled with fully  threaded cancellous screws.  Image intensification at that point revealed  excellent position of the plate and normal ankle mortise.  The wound was  then irrigated with saline solution.  The fascia was closed with [name] 3-0  Vicryl.  The skin was then closed with skin clips.   An oblique incision about an inch in length was then made directly over the  deltoid ligament.  Via sharp dissection, the incision was carried down  through subcutaneous tissue.  Then via blunt dissection, the deltoid  ligament was identified.  There were some areas that were torn and  retracted, and these were placed in anatomic position.  The posterior tibial  tendon was identified and sheath and was intact and not dislocated.  At that  point, I felt we had [name] very stable construct.  The wound was irrigated with  saline, and the skin closed with skin clips.  [name] sterile, bulky dressing was  applied.  Final image  intensification and films in the AP and lateral  projection were obtained and were in excellent position.  [name] sterile, bulky  dressing was applied followed by posterior splints and an Ace bandage.  The  tourniquet was deflated, with immediate capillary refill to the toes.   The patient tolerated the procedure without complications.      PWW/MEDQ  D:  08/08/2004  T:  08/10/2004  Job:  [id number]

## 2011-04-30 ENCOUNTER — Other Ambulatory Visit: Payer: Self-pay | Admitting: Family Medicine

## 2011-04-30 DIAGNOSIS — Z1231 Encounter for screening mammogram for malignant neoplasm of breast: Secondary | ICD-10-CM

## 2011-07-10 ENCOUNTER — Ambulatory Visit
Admission: RE | Admit: 2011-07-10 | Discharge: 2011-07-10 | Disposition: A | Discharge status: Home / Self Care | Payer: BC Managed Care – PPO | Source: Ambulatory Visit | Attending: Family Medicine | Admitting: Family Medicine

## 2011-07-10 DIAGNOSIS — Z1231 Encounter for screening mammogram for malignant neoplasm of breast: Secondary | ICD-10-CM

## 2011-08-19 ENCOUNTER — Observation Stay (HOSPITAL_COMMUNITY)
Admission: AD | Admit: 2011-08-19 | Discharge: 2011-08-20 | Disposition: A | Discharge status: Home / Self Care | Payer: BC Managed Care – PPO | Source: Ambulatory Visit | Attending: Family Medicine | Admitting: Family Medicine

## 2011-08-19 ENCOUNTER — Encounter: Payer: Self-pay | Admitting: Family Medicine

## 2011-08-19 ENCOUNTER — Observation Stay (HOSPITAL_COMMUNITY): Payer: BC Managed Care – PPO

## 2011-08-19 DIAGNOSIS — K219 Gastro-esophageal reflux disease without esophagitis: Secondary | ICD-10-CM

## 2011-08-19 DIAGNOSIS — R062 Wheezing: Secondary | ICD-10-CM | POA: Insufficient documentation

## 2011-08-19 DIAGNOSIS — R059 Cough, unspecified: Secondary | ICD-10-CM | POA: Insufficient documentation

## 2011-08-19 DIAGNOSIS — R0602 Shortness of breath: Secondary | ICD-10-CM

## 2011-08-19 DIAGNOSIS — F329 Major depressive disorder, single episode, unspecified: Secondary | ICD-10-CM

## 2011-08-19 DIAGNOSIS — E785 Hyperlipidemia, unspecified: Secondary | ICD-10-CM

## 2011-08-19 DIAGNOSIS — R05 Cough: Secondary | ICD-10-CM | POA: Insufficient documentation

## 2011-08-19 LAB — COMPREHENSIVE METABOLIC PANEL
Alkaline Phosphatase: 79 U/L (ref 39–117)
Calcium: 9.4 mg/dL (ref 8.4–10.5)
Chloride: 105 mEq/L (ref 96–112)
Creatinine, Ser: 0.81 mg/dL (ref 0.50–1.10)
GFR calc non Af Amer: >60 mL/min (ref >60)

## 2011-08-19 LAB — DIFFERENTIAL
Eosinophils Absolute: 0 10*3/uL (ref 0.0–0.7)
Monocytes Absolute: 0.2 10*3/uL (ref 0.1–1.0)
Monocytes Relative: 2 % — ABNORMAL LOW (ref 3–12)
Neutro Abs: 8.3 10*3/uL — ABNORMAL HIGH (ref 1.7–7.7)

## 2011-08-19 LAB — CBC
MCH: 29.9 pg (ref 26.0–34.0)
MCV: 88.6 fL (ref 78.0–100.0)
RBC: 4.99 MIL/uL (ref 3.87–5.11)
RDW: 14.2 % (ref 11.5–15.5)

## 2011-08-19 NOTE — H&P (Signed)
Family Medicine Teaching [facility] Admission History and Physical  Patient name: Rhonda [name] Medical record number: [id number] Date of birth: 1951-02-14 Age: 60 y.o. Gender: female  Primary [name] Provider: [name]  Chief Complaint: Cough and shortness of breath History of Present Illness: Rhonda [name] is a 60 y.o. year old female with a history of chronic dyspnea on exertion presenting with cough and shortness of breath. Symptoms began 4 days ago and initially included a sore throat, productive cough, and shortness of breath. Her cough has produced brown/yellow sputum.  Pt was seen at [name] on 9/24 and was started on Avelox. Last night pt began to feel that she could not fill her lungs with air, and had difficulty coughing up sputum. She returned to [place] today for a follow-up appointment and apparently had oxygen saturations in the low 90s on room air, so [facility] opted to have her admitted to the hospital. At her baseline, patient is able to swim and walk on flat levels, but gets significantly short of breath when walking up an incline. Of note patient has been on and off prednisone since August after having vertebral disc pain and a bout of poison ivy.  Past Medical History: Seasonal/environmental allergies Lactose intolerance GERD Idiopathic tachycardia Depression Mitral regurgitation Hyperlipidemia Insulin resistance treated with diet/exercise Pelvic organ prolapse (not requiring intervention)  Past Surgical History: Orthopedic repair of broken foot 6 years ago Cesarean section Tubal ligation  Social History: Patient eats a healthy diet and swims several times per week. She lives with her husband and feels safe at home. She works as a Human resources officer. She smoked cigarettes occasionally in college, but has no other smoking history. Drinks a glass of wine 1-2x/week. Occasional marijuana use in the distant past, but no history of IV drug use or any  present illicit drug use.  Family History: Non contributory  Allergies: Shellfish - nausea/vomiting/diarrhea Betadine - blistering Codeine - vomiting Sulfa drugs - unknown reaction Atorvastatin - unknown Rosuvastatin - unknown  Outpatient Medications: Metoprolol Prilosec Fluoxetine q2-3 days Albuterol Levocetirizine Singulair Asthmanex  Review Of Systems: As per HPI and unremarkable except for: recent headache when coughing, diffuse chest pain for several days recently (possibly due to coughing). Otherwise 12 point review of systems was performed and was unremarkable.  Physical Exam: Pulse: 69  Blood Pressure: 135/77 RR: 20   O2: 95% on RA Temp: 97.5  General: Awake, alert, in NAD. HEENT:MMM, no oral exudates or ulcers. Heart: RRR Lungs: Diffuse inspiratory and expiratory wheezes, and ronchi. Prolonged expiratory phase. Normal work of breathing. Abdomen:Soft, nontender, nondistended. Positive bowel sounds. Extremities:Lower extremities nontender, nonerythematous, non edematous. 2+ dorsal pedis pulses bilaterally. Skin: No obvious rashes. Neurology: A&Ox3, nonfocal neuro exam.  Labs and Imaging: None  Assessment and Plan: Rhonda [name] is a 60 y.o. year old female with a history of chronic dyspnea on exertion presenting with 4 days of productive cough and shortness of breath, who had oxygen saturation in the low 90s at her outpatient provider's office today. 1. Shortness of breath/cough: This appears to be an exacerbation of a chronic lung condition, most likely undiagnosed COPD vs other chronic pulmonary disease. Will admit patient to the floor under observation status, and give albuterol nebs q4h, with q2h prn. Will continue Avelox 400mg  QD and prednisone 50mg  QD. Patient is satting at 95% on room air and thus does not require oxygen therapy at this time. Will monitor vital signs and reassess need for oxygen. Rhonda [name]  could benefit from pulmonary function testing as  an outpatient when she is at her baseline. Will order 2-view CXR, CBC with diff, and CMP now. 2. FEN/GI: Heparin lock IV, regular diet. 3. Prophylaxis: Heparin 5000u SQ TID, protonix 40mg  QD. 4. Disposition: observation status.

## 2011-08-31 NOTE — H&P (Signed)
Rhonda [name], Rhonda [name]             ACCOUNT NO.:  [id number]  MEDICAL RECORD NO.:  [id number]  LOCATION:                                 FACILITY:  PHYSICIAN:  Clementeen Graham, MD         DATE OF BIRTH:  08-30-1951  DATE OF ADMISSION: DATE OF DISCHARGE:                             HISTORY & PHYSICAL   PRIMARY CARE PROVIDER:  [facility].  CHIEF COMPLAINT:  Cough and shortness of breath.  HISTORY OF PRESENT ILLNESS:  Rhonda [name] is a 60 year old female with history of chronic dyspnea on exertion, presenting with cough and shortness of breath.  Symptoms began 4 days and initially included a sore throat, productive cough, and shortness of breath.  Her cough has produced a brown and yellow sputum.  The patient was seen at [facility] on September, 24, 2012, and was started on Avelox.  Last night, the patient began to feel that she could not fill her lungs with air and had difficulty coughing up sputum.  She returned to [place] today for followup appointment and apparently had oxygen saturations in the low 90s on room air, so [facility] opted to have her admitted to the hospital.  At her baseline, the patient is able to swim and walk on flat levels, but gets significantly short of breath when walking up in incline.  Of note, the patient has been on and off prednisone since August 2012, after having vertebral disk pain and a bout of poison ivy.  PAST MEDICAL HISTORY: 1. Seasonal and environmental allergies. 2. Lactose intolerance. 3. GERD. 4. Idiopathic tachycardia. 5. Depression. 6. Mitral regurgitation. 7. Hyperlipidemia. 8. Insulin resistance, treated with diet and exercise. 9. Pelvic organ prolapse, not requiring intervention.  PAST SURGICAL HISTORY: 1. Orthopedic repair of broken foot 6 years ago. 2. Cesarean section. 3. Tubal ligation.  SOCIAL HISTORY:  The patient eats a healthy diet and swims several times per week.  She lives with her husband and feels safe at  home.  She works as a Human resources officer.  She smoked cigarettes occasionally in college, but has no other smoking history.  Drinks a glass of wine 1-2 times per week.  Occasional marijuana use in the past, but no history of IV drug use or any present illicit drug use.  FAMILY HISTORY:  Noncontributory.  ALLERGIES: 1. SHELLFISH causing nausea, vomiting, and diarrhea. 2. BETADINE causes blistering. 3. CODEINE causes vomiting. 4. SULFA DRUGS produces an unknown reaction. 5. ATORVASTATIN produces an unknown reaction. 6. ROSUVASTATIN produces an unknown reaction.  OUTPATIENT MEDICATIONS: 1. Metoprolol. 2. Prilosec. 3. Fluoxetine every 2-3 days. 4. Albuterol. 5. Levocetirizine. 6. Singulair. 7. Asmanex.  REVIEW OF SYSTEMS:  As per HPI and unremarkable except for recent headache when coughing, diffuse chest pain for several days recently, possibly due to coughing.  Otherwise, review of systems was performed and unremarkable.  PHYSICAL EXAMINATION:  VITAL SIGNS:  Pulse 69, blood pressure 135/77, respirations 20, oxygen saturation 95% on room air, temperature 97.5. GENERAL:  Awake, alert, in no distress. HEENT:  Moist mucous membranes.  No oral exudates or ulcers. HEART:  Regular rate and rhythm. LUNGS:  Diffuse inspiratory and expiratory wheezes and rhonchi.  Prolonged expiratory phase.  Normal work of breathing. ABDOMEN:  Soft, nontender, nondistended.  Positive bowel sounds. EXTREMITIES:  Lower extremities were nontender, nonerythematous, nonedematous.  2+ dorsal pedal pulses bilaterally. SKIN:  No obvious rashes. NEUROLOGIC:  Alert and oriented x3.  Nonfocal neurological exam.  LABS AND IMAGING:  None.  ASSESSMENT AND PLAN:  Rhonda [name] is a 60 year old female with a history of chronic dyspnea on exertion, presenting with 4 days of productive cough and shortness of breath, who had an oxygen saturation in the low 90s at her outpatient provider's office today.  1. Shortness  of breath and cough.  This appears to be an exacerbation     of her chronic lung condition, most likely undiagnosed chronic     obstructive pulmonary disease versus another chronic pulmonary     disease.  We will admit the patient to the floor under observation     status and give albuterol nebulizers q.4 hours, is available q.2     hours p.r.n.  We will continue Avelox 400 mg daily and prednisone     50 mg daily.  This patient is satting at 95% on room air currently     and does not require oxygen therapy at this time.  We will monitor     vital sign and reassess need for oxygen.  Rhonda [name] could     benefit from pulmonary function testing as an outpatient when she     is at her baseline.  We will order 2-view chest x-ray, CBC with     differential, and CMP now. 2. Fluids, electrolytes, and nutrition/gastrointestinal.  Heparin lock     IV and regular diet. 3. Prophylaxis with heparin 5000 units subcu t.i.d. for DVT     prophylaxis and Protonix 40 mg p.o. daily for GI prophylaxis. 4. Disposition.  Observation status pending clinical improvement.    ______________________________ Levert Feinstein   ______________________________ Clementeen Graham, MD    BM/MEDQ  D:  08/19/2011  T:  08/19/2011  Job:  [id number]  Electronically Signed by Paula Compton MD on 08/31/2011 01:55:17 PM

## 2011-08-31 NOTE — Discharge Summary (Signed)
  [name], Rhonda [name]             ACCOUNT NO.:  [id number]  MEDICAL RECORD NO.:  [id number]  LOCATION:                                 FACILITY:  PHYSICIAN:  Clementeen Graham, MD         DATE OF BIRTH:  March 22, 1951  DATE OF ADMISSION:  08/19/2011 DATE OF DISCHARGE:  08/20/2011                              DISCHARGE SUMMARY   PRIMARY CARE PROVIDER:  [facility].  REASON FOR ADMISSION:  Shortness of breath and cough with hypoxia.  DISCHARGE DIAGNOSIS:  Shortness of breath.  DISCHARGE MEDICATIONS: 1. Albuterol inhaler use every 6 hours for 2 days and every 4 hours as     needed. 2. Fluoxetine 20 mg by mouth every other day. 3. Fluticasone 50 mcg nasal spray two sprays nasally twice daily. 4. Guaifenesin 600 mg two tablets by mouth twice daily. 5. Metoprolol succinate 50 mg XL tablets by mouth daily. 6. Singulair 10 mg by mouth daily. 7. Moxifloxacin 400 mg by mouth daily. 8. Prednisone 50 mg by mouth daily with meals. 9. Simvastatin 20 mg by mouth daily.  HOSPITAL COURSE:  Cough and shortness of breath.  The patient was admitted on observation status and was given albuterol treatments every 4 hours.  She was also continued on her outpatient medication of Avelox 400 mg per day as well as prednisone 50 mg per day.  The patient was satting well on room air throughout hospitalization and did not require oxygen.  The patient clinically improved overnight and was discharged home the day after admission in a stable condition.  PENDING TESTS AT THE TIME OF DISCHARGE:  None.  DISPOSITION:  Home.  DISCHARGE FOLLOWUP:  The patient will follow up with her primary care provider as an outpatient.  FOLLOWUP ISSUES:  This patient has symptoms and a history concerning for chronic lung disease.  Would recommend the patient undergo pulmonary function testing as an outpatient when she is back at her baseline.    ______________________________ Levert Feinstein   ______________________________ Clementeen Graham, MD    BM/MEDQ  D:  08/20/2011  T:  08/20/2011  Job:  [id number]  Electronically Signed by Paula Compton MD on 08/31/2011 01:55:08 PM

## 2011-09-14 ENCOUNTER — Encounter: Payer: Self-pay | Admitting: Internal Medicine

## 2011-09-15 ENCOUNTER — Ambulatory Visit (INDEPENDENT_AMBULATORY_CARE_PROVIDER_SITE_OTHER): Payer: BC Managed Care – PPO | Admitting: Internal Medicine

## 2011-09-15 ENCOUNTER — Encounter: Payer: Self-pay | Admitting: Internal Medicine

## 2011-09-15 VITALS — BP 122/78 | HR 86 | Temp 98.8°F | Ht 61.0 in | Wt 166.6 lb

## 2011-09-15 DIAGNOSIS — R0609 Other forms of dyspnea: Secondary | ICD-10-CM

## 2011-09-15 DIAGNOSIS — R0602 Shortness of breath: Secondary | ICD-10-CM

## 2011-09-15 DIAGNOSIS — R0989 Other specified symptoms and signs involving the circulatory and respiratory systems: Secondary | ICD-10-CM

## 2011-09-15 NOTE — Patient Instructions (Signed)
Get full pulmonary function test After that call us and let us know test done and I Will call you back in a week or so with test results and next step

## 2011-09-15 NOTE — Progress Notes (Signed)
Subjective:    Patient ID: Rhonda [name], female    DOB: 1951-03-31, 60 y.o.   MRN: [id number]  HPI IOV 09/15/11: 60 year old, non-smoker. GE Reflux with PPI. 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 Admitted 08/19/11 - 08/20/11 with acute exacerbation of chronic dyspnea. Noted to be hypoxemic. No specific diagnosis given but discharged on prednisone burst and antibiotic burst. But really her problem is dyspnea for several years. 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 But as an adult, insidious onset of dyspnea for few years. Associated with cough and small amounts of thick white sputum for a few years as well esp early in the morning. Dyspnea brought on by walking any  Incline and on many occassions climbing 1 flight of stairs. Associated dizziness +. Able to swim without problem (swims alt day x several years). Rates dyspnea as progressive and as moderate.  Denies chest pain but occ feels some rib pain. BEnding makes dyspnea worse.  Walked on room aior - pulse ox at rest 92%. AFter exertin 185 ft x 3 laps: pulse ox 90%. HR 108  Cough present for 1 year. RSI score 15 with PPI and 18 without PPI. Ass clearing of throat all the time present. Associated small amount of sputum +. Symptoms imprved by nasal steroid spray. Noted to have allergies and on singulair for several years. Also xyzal in morning. Also on asmanex for several years. Noted to be on beta blocker lopressor.   Review of chart shows 10/15/09 recollects dyspnea on exertion on a trip to [place]. CT chest ruled out PE and reported as normal parenchyma (non-specific ILD in my opinion). Current 08/19/11 cxr shows some peri-bronchial thickening.   Past Medical History  Diagnosis Date  . SOB (shortness of breath)   . High cholesterol   . History of chronic bronchitis     as child     Family History  Problem Relation Age of Onset  .  Emphysema Maternal Grandmother   . Asthma Mother   . Rheum arthritis Mother   . Rheum arthritis Father   . Lymphoma Father   . Cancer Maternal Grandfather      History   Social History  . Marital Status: Married    Spouse Name: [name]    Number of Children: N/A  . Years of Education: N/A   Occupational History  . SELF EMPLOYED     speech patholoigists  .     Social History Main Topics  . Smoking status: Never Smoker   . Smokeless tobacco: Not on file   Comment: pt states she experimented in college  . Alcohol Use: 0.5 oz/week    1 drink(s) per week  . Drug Use: No  . Sexually Active: Not on file   Other Topics Concern  . Not on file   Social History Narrative  . No narrative on file     Allergies  Allergen Reactions  . Atorvastatin   . Codeine     REACTION: nausea  . Povidone-Iodine   . Rosuvastatin   . Sulfonamide Derivatives      Outpatient Prescriptions Prior to Visit  Medication Sig Dispense Refill  . albuterol (PROVENTIL) (2.5 MG/3ML) 0.083% nebulizer solution Take 2.5 mg by nebulization every 4 (four) hours as needed.        Marland Kitchen FLUoxetine (PROZAC) 20 MG tablet Take 20 mg by mouth. Takes 1 pill  every 3 days      . fluticasone (FLONASE) 50 MCG/ACT nasal spray Place 2 sprays into the nose daily.        Marland Kitchen levocetirizine (XYZAL) 5 MG tablet Take 5 mg by mouth daily.        . metoprolol (TOPROL-XL) 50 MG 24 hr tablet Take 50 mg by mouth daily.        . mometasone (ASMANEX) 220 MCG/INH inhaler Inhale 2 puffs into the lungs daily.        . montelukast (SINGULAIR) 10 MG tablet Take 10 mg by mouth at bedtime.        . benzonatate (TESSALON) 100 MG capsule Take by mouth as needed.        Marland Kitchen guaiFENesin (MUCINEX) 600 MG 12 hr tablet Take 1,200 mg by mouth 2 (two) times daily.        Marland Kitchen moxifloxacin (AVELOX) 400 MG tablet Take 400 mg by mouth daily.        . predniSONE (DELTASONE) 50 MG tablet Take 50 mg by mouth daily.        . simvastatin (ZOCOR) 20 MG tablet Take 20  mg by mouth daily.             Review of Systems  Constitutional: Negative for fever and unexpected weight change.  HENT: Positive for trouble swallowing. Negative for ear pain, nosebleeds, congestion, sore throat, rhinorrhea, sneezing, dental problem, postnasal drip and sinus pressure.   Eyes: Negative for redness and itching.  Respiratory: Positive for shortness of breath. Negative for cough, chest tightness and wheezing.   Cardiovascular: Negative for palpitations and leg swelling.  Gastrointestinal: Negative for nausea and vomiting.  Genitourinary: Negative for dysuria.  Musculoskeletal: Negative for joint swelling.  Skin: Negative for rash.  Neurological: Positive for headaches.  Hematological: Does not bruise/bleed easily.  Psychiatric/Behavioral: Positive for dysphoric mood. The patient is not nervous/anxious.        Objective:   Physical Exam  Vitals reviewed. Constitutional: She is oriented to person, place, and time. She appears well-developed and well-nourished. No distress.       overweight  HENT:  Head: Normocephalic and atraumatic.  Right Ear: External ear normal.  Left Ear: External ear normal.  Mouth/Throat: Oropharynx is clear and moist. No oropharyngeal exudate.  Eyes: Conjunctivae and EOM are normal. Pupils are equal, round, and reactive to light. Right eye exhibits no discharge. Left eye exhibits no discharge. No scleral icterus.  Neck: Normal range of motion. Neck supple. No JVD present. No tracheal deviation present. No thyromegaly present.       mallampatti class 2-3  Cardiovascular: Normal rate, regular rhythm, normal heart sounds and intact distal pulses.  Exam reveals no gallop and no friction rub.   No murmur heard. Pulmonary/Chest: Effort normal and breath sounds normal. No respiratory distress. She has no wheezes. She has no rales. She exhibits no tenderness.  Abdominal: Soft. Bowel sounds are normal. She exhibits no distension and no mass. There is  no tenderness. There is no rebound and no guarding.  Musculoskeletal: Normal range of motion. She exhibits no edema and no tenderness.  Lymphadenopathy:    She has no cervical adenopathy.  Neurological: She is alert and oriented to person, place, and time. She has normal reflexes. No cranial nerve deficit. She exhibits normal muscle tone. Coordination normal.  Skin: Skin is warm and dry. No rash noted. She is not diaphoretic. No erythema. No pallor.  Psychiatric: She has a normal mood and affect. Her  behavior is normal. Judgment and thought content normal.          Assessment & Plan:

## 2011-09-17 NOTE — Assessment & Plan Note (Signed)
Unclear etiology  Plan Get full pulmonary function test After that call us and let us know test done and I Will call you back in a week or so with test results and next step

## 2011-10-05 ENCOUNTER — Encounter (INDEPENDENT_AMBULATORY_CARE_PROVIDER_SITE_OTHER): Payer: BC Managed Care – PPO | Admitting: Internal Medicine

## 2011-10-05 DIAGNOSIS — R0609 Other forms of dyspnea: Secondary | ICD-10-CM

## 2011-10-05 DIAGNOSIS — R0989 Other specified symptoms and signs involving the circulatory and respiratory systems: Secondary | ICD-10-CM

## 2011-10-05 LAB — PULMONARY FUNCTION TEST

## 2011-10-09 ENCOUNTER — Telehealth: Payer: Self-pay | Admitting: Internal Medicine

## 2011-10-09 DIAGNOSIS — R0989 Other specified symptoms and signs involving the circulatory and respiratory systems: Secondary | ICD-10-CM

## 2011-10-09 DIAGNOSIS — R0609 Other forms of dyspnea: Secondary | ICD-10-CM

## 2011-10-09 NOTE — Telephone Encounter (Signed)
lmomtcb  

## 2011-10-09 NOTE — Telephone Encounter (Signed)
I spoke with pt and she states she was calling to let MR know that she had her PFT done on 10/07/11 and is wanting to know the results. Please advise Dr. Marchelle Gearing, thanks

## 2011-10-09 NOTE — Telephone Encounter (Signed)
Reviewed pft dated 10/05/11. Please state  A) could not call earlier because I was on ICU rotation B) PFTs normal lung strength and mechanics but slight problem with way that oxygen flows from the air we breathe into the lungs and transfer across into the blood, the problem is in the transfer process  To help understand this better is to get non-contrast high resolution ct chest (please tell her that she had ct chest in 2010 or so but that was with contrast) and return to see me on fu for review of results

## 2011-10-12 NOTE — Telephone Encounter (Signed)
I spoke with pt and she verbalized understanding of what MR stated. She was fine with getting ct chest set up. Order has been placed and aware she will be contacted about getting CT set up. Will forward to [facility] so they are aware

## 2011-10-12 NOTE — Telephone Encounter (Signed)
lmomtcb x1 

## 2011-10-12 NOTE — Telephone Encounter (Signed)
Pt stated she will call back.  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  Antionette Fairy

## 2011-10-12 NOTE — Telephone Encounter (Signed)
Pt returned call. Asks that she get a call back within the next 30 mins. Rhonda [name]

## 2011-10-13 ENCOUNTER — Ambulatory Visit (INDEPENDENT_AMBULATORY_CARE_PROVIDER_SITE_OTHER)
Admission: RE | Admit: 2011-10-13 | Discharge: 2011-10-13 | Disposition: A | Discharge status: Home / Self Care | Payer: BC Managed Care – PPO | Source: Ambulatory Visit | Attending: Internal Medicine | Admitting: Internal Medicine

## 2011-10-13 ENCOUNTER — Telehealth: Payer: Self-pay | Admitting: Internal Medicine

## 2011-10-13 DIAGNOSIS — R0609 Other forms of dyspnea: Secondary | ICD-10-CM

## 2011-10-13 DIAGNOSIS — J8410 Pulmonary fibrosis, unspecified: Secondary | ICD-10-CM

## 2011-10-13 NOTE — Telephone Encounter (Signed)
Rhonda [name]  I have reviewed CT. Let her know that is not normal and there are changes that can explain her shortness of breath. I am consulting a specialist radiolgist in the area to get her opinion on this. Once I hear from her, will be able to inform more.   Meanwhile, get blood tests to look for potential causes - ANA, DS-DNA, RF, ccp, scl-70, anca, ssA, ssB, ESR, ACE level,  Total CK, Hypersensitivity pneumonitis panel  Need to see me after blood tests are done and resulted so I can discuss CT findings. appt in < 2 weeks

## 2011-10-14 ENCOUNTER — Encounter: Payer: Self-pay | Admitting: Internal Medicine

## 2011-10-14 NOTE — Telephone Encounter (Signed)
LMTCBx1. Labs have been ordered. Carron Curie, CMA

## 2011-10-14 NOTE — Telephone Encounter (Signed)
Pt aware and will come by the lab on Fri., 11/23 to have blood drawn. She is scheduled for follow-up with MR on 11/03/11 @ 12 noon.

## 2011-10-23 ENCOUNTER — Other Ambulatory Visit (INDEPENDENT_AMBULATORY_CARE_PROVIDER_SITE_OTHER): Payer: BC Managed Care – PPO

## 2011-10-23 DIAGNOSIS — J8410 Pulmonary fibrosis, unspecified: Secondary | ICD-10-CM

## 2011-10-24 LAB — RHEUMATOID FACTOR: Rhuematoid fact SerPl-aCnc: 13 IU/mL (ref <=14)

## 2011-10-26 LAB — ANCA SCREEN W REFLEX TITER
Atypical p-ANCA Screen: NEGATIVE
p-ANCA Screen: NEGATIVE

## 2011-10-26 LAB — ANTI-SCLERODERMA ANTIBODY: Scleroderma (Scl-70) (ENA) Antibody, IgG: 1 AU/mL (ref <30)

## 2011-10-26 LAB — ANTI-DNA ANTIBODY, DOUBLE-STRANDED: ds DNA Ab: 6 IU/mL (ref <30)

## 2011-10-26 LAB — ANA: Anti Nuclear Antibody(ANA): NEGATIVE

## 2011-10-26 LAB — SJOGRENS SYNDROME-A EXTRACTABLE NUCLEAR ANTIBODY: SSA (Ro) (ENA) Antibody, IgG: 9 AU/mL (ref <30)

## 2011-10-29 ENCOUNTER — Ambulatory Visit (INDEPENDENT_AMBULATORY_CARE_PROVIDER_SITE_OTHER): Payer: BC Managed Care – PPO | Admitting: Internal Medicine

## 2011-10-29 ENCOUNTER — Encounter: Payer: Self-pay | Admitting: Internal Medicine

## 2011-10-29 ENCOUNTER — Telehealth: Payer: Self-pay | Admitting: Internal Medicine

## 2011-10-29 DIAGNOSIS — J849 Interstitial pulmonary disease, unspecified: Secondary | ICD-10-CM

## 2011-10-29 DIAGNOSIS — J84115 Respiratory bronchiolitis interstitial lung disease: Secondary | ICD-10-CM

## 2011-10-29 DIAGNOSIS — J679 Hypersensitivity pneumonitis due to unspecified organic dust: Secondary | ICD-10-CM | POA: Insufficient documentation

## 2011-10-29 DIAGNOSIS — R0683 Snoring: Secondary | ICD-10-CM | POA: Insufficient documentation

## 2011-10-29 DIAGNOSIS — J209 Acute bronchitis, unspecified: Secondary | ICD-10-CM | POA: Insufficient documentation

## 2011-10-29 DIAGNOSIS — R0609 Other forms of dyspnea: Secondary | ICD-10-CM

## 2011-10-29 MED ORDER — DOXYCYCLINE HYCLATE 100 MG PO TABS: 100.0000 mg | ORAL_TABLET | Freq: Two times a day (BID) | ORAL | Status: AC

## 2011-10-29 NOTE — Assessment & Plan Note (Signed)
#  Intersitial Lung Disease - you have some kind of process in your lungs that is likely inflammatory and is causing your shortness of breath and making you drop oxygen  - we will set up with overnight pulse oximetry study to look at oxygen levels at night when you sleep - you need oxygen with walking but we will take this decision after ono study  - please get rid of your birds and hold off on oil painting for now  - you need bronchoscopy with biopsy but will schedule this after sleep eval #Followup  - call me after you sleep doctor to set up bronchoscopy  This visit was converted to > 45 min visit. > 50% of time was spent in face to face counseling on this section

## 2011-10-29 NOTE — Telephone Encounter (Signed)
Hypersensitivity panel still says pending. Other autoimmune results are back. Please check why still pending

## 2011-10-29 NOTE — Assessment & Plan Note (Signed)
#  Possible Sleep APnea  - see one of our sleep doctors soon - make sure is safe for bronchoscopy sedation with them

## 2011-10-29 NOTE — Patient Instructions (Signed)
#  Acute bRonchitis You have mild acute bronchitis Take doxycycline 100mg  po twice daily x 5 days; take after meals and avoid sunlight #Possible Sleep APnea  - see one of our sleep doctors soon - make sure is safe for bronchoscopy sedation with them #Intersitial Lung Disease - you have some kind of process in your lungs that is likely inflammatory and is causing your shortness of breath and making you drop oxygen  - we will set up with overnight pulse oximetry study to look at oxygen levels at night when you sleep - you need oxygen with walking but we will take this decision after ono study  - please get rid of your birds and hold off on oil painting for now  - you need bronchoscopy with biopsy but will schedule this after sleep eval #Followup  - call me after you sleep doctor to set up bronchoscopy

## 2011-10-29 NOTE — Assessment & Plan Note (Signed)
#  Acute bRonchitis You have mild acute bronchitis Take doxycycline 100mg  po twice daily x 5 days; take after meals and avoid sunlight

## 2011-10-29 NOTE — Progress Notes (Signed)
Subjective:    Patient ID: Rhonda [name], female    DOB: 1951-10-20, 60 y.o.   MRN: [id number]  HPI IOV 09/15/11: 60 year old, non-smoker. GE Reflux with PPI. 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 Admitted 08/19/11 - 08/20/11 with acute exacerbation of chronic dyspnea. Noted to be hypoxemic. No specific diagnosis given but discharged on prednisone burst and antibiotic burst. But really her problem is dyspnea for several years. 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 But as an adult, insidious onset of dyspnea for few years. Associated with cough and small amounts of thick white sputum for a few years as well esp early in the morning. Dyspnea brought on by walking any  Incline and on many occassions climbing 1 flight of stairs. Associated dizziness +. Able to swim without problem (swims alt day x several years). Rates dyspnea as progressive and as moderate.  Denies chest pain but occ feels some rib pain. BEnding makes dyspnea worse.  Walked on room aior - pulse ox at rest 92%. AFter exertin 185 ft x 3 laps: pulse ox 90%. HR 108  Cough present for 1 year. RSI score 15 with PPI and 18 without PPI. Ass clearing of throat all the time present. Associated small amount of sputum +. Symptoms imprved by nasal steroid spray. Noted to have allergies and on singulair for several years. Also xyzal in morning. Also on asmanex for several years. Noted to be on beta blocker lopressor.   Review of chart shows 10/15/09 recollects dyspnea on exertion on a trip to [place]. CT chest ruled out PE and reported as normal parenchyma (non-specific ILD in my opinion). Current 08/19/11 cxr shows some peri-bronchial thickening.   OV 10/29/2011 After last visit got PFTs. Reviewed pft dated 10/05/11. - PFTs normal lung strength and mechanics but low diffusion. So got CT chest 10/13/11 and had Dr Fredirick Lathe review it: She felt,  "Looking back at her study on 10/15/09, she did have ILD at that time and it has progressed slightly on the current study of 10/13/11.  I would consider post-inflammatory fibrosis, chronic HP or less likely NSIP.  No honeycombing to suggest UIP, and there are areas of more central parenchymal involvement which also mitigates against UIP". Then, he had autoimmune panel: 10/23/11: Autoimmune panel: ANA, dsDNA, ccp, RF, scl70, ssA, ssB - all negative. ACE level normal. HP panel - pending  Today fu for above: now cold x 3 days, sore throat x 3 days, lot of people in community sick. Mild congestion. Coughing greenish brown sputum esp in morning - small amounts, increased in amounts by hot shower or mucinex. 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 No Xs day time somnolence but takes 3pm naps  Past Medical History  Diagnosis Date  . SOB (shortness of breath)   . High cholesterol   . History of chronic bronchitis     as child     Family History  Problem Relation Age of Onset  . Emphysema Maternal Grandmother   . Asthma Mother   . Rheum arthritis Mother   . Rheum arthritis Father   . Lymphoma Father   . Cancer Maternal Grandfather      History   Social History  . Marital Status:  Married    Spouse Name: [name]    Number of Children: N/A  . Years of Education: N/A   Occupational History  . SELF EMPLOYED     speech patholoigists  .     Social History Main Topics  . Smoking status: Never Smoker   . Smokeless tobacco: Not on file   Comment: pt states she experimented in college  . Alcohol Use: 0.5 oz/week    1 drink(s) per week  . Drug Use: No  . Sexually Active: Not on file   Other Topics Concern  . Not on file   Social History Narrative  . No narrative on file     Allergies  Allergen Reactions  . Atorvastatin   . Codeine      REACTION: nausea  . Povidone-Iodine   . Rosuvastatin   . Sulfonamide Derivatives      Outpatient Prescriptions Prior to Visit  Medication Sig Dispense Refill  . albuterol (PROVENTIL) (2.5 MG/3ML) 0.083% nebulizer solution Take 2.5 mg by nebulization every 4 (four) hours as needed.        . calcium carbonate (TUMS) 500 MG chewable tablet Chew 1 tablet by mouth daily.        Marland Kitchen FLUoxetine (PROZAC) 20 MG tablet Take 20 mg by mouth. Takes 1 pill every 3 days      . fluticasone (FLONASE) 50 MCG/ACT nasal spray Place 2 sprays into the nose 2 (two) times daily.       Marland Kitchen guaiFENesin (MUCINEX) 600 MG 12 hr tablet Take 1,200 mg by mouth 2 (two) times daily.        Marland Kitchen levocetirizine (XYZAL) 5 MG tablet Take 5 mg by mouth daily.        . metoprolol (TOPROL-XL) 50 MG 24 hr tablet Take 50 mg by mouth daily.        . mometasone (ASMANEX) 220 MCG/INH inhaler Inhale 1 puff into the lungs daily.       . montelukast (SINGULAIR) 10 MG tablet Take 10 mg by mouth at bedtime.        Marland Kitchen omeprazole (PRILOSEC) 20 MG capsule Take 20 mg by mouth daily.        . pravastatin (PRAVACHOL) 40 MG tablet Take 40 mg by mouth daily.       Marland Kitchen SINGULAIR 10 MG tablet       . benzonatate (TESSALON) 100 MG capsule Take by mouth as needed.                                    Review of Systems  Constitutional: Negative.  Negative for fever and unexpected weight change.  HENT: Positive for ear pain and postnasal drip. Negative for nosebleeds, congestion, sore throat, rhinorrhea, sneezing, trouble swallowing, dental problem and sinus pressure.   Eyes: Negative.  Negative for redness and itching.  Respiratory: Positive for cough. Negative for chest tightness, shortness of breath and wheezing.   Cardiovascular: Negative.  Negative for palpitations and leg swelling.  Gastrointestinal: Negative.  Negative for nausea and vomiting.  Genitourinary: Negative.  Negative for dysuria.  Musculoskeletal: Negative.  Negative for joint  swelling.  Skin: Negative.  Negative for rash.  Neurological: Positive for headaches.  Hematological: Negative.  Does not bruise/bleed easily.  Psychiatric/Behavioral: Negative.  Negative for dysphoric mood. The patient is not nervous/anxious.        Objective:   Physical Exam  hysical Exam  Vitals  reviewed. Constitutional: She is oriented to person, place, and time. She appears well-developed and well-nourished. No distress.       overweight  HENT:  Head: Normocephalic and atraumatic.  Right Ear: External ear normal.  Left Ear: External ear normal.  Mouth/Throat: Oropharynx is clear and moist. No oropharyngeal exudate.  Eyes: Conjunctivae and EOM are normal. Pupils are equal, round, and reactive to light. Right eye exhibits no discharge. Left eye exhibits no discharge. No scleral icterus.  Neck: Normal range of motion. Neck supple. No JVD present. No tracheal deviation present. No thyromegaly present.       mallampatti class 2-3  Cardiovascular: Normal rate, regular rhythm, normal heart sounds and intact distal pulses.  Exam reveals no gallop and no friction rub.   No murmur heard. Pulmonary/Chest: Effort normal and breath sounds normal. No respiratory distress. She has no wheezes. She has no rales. She exhibits no tenderness.  Abdominal: Soft. Bowel sounds are normal. She exhibits no distension and no mass. There is no tenderness. There is no rebound and no guarding.  Musculoskeletal: Normal range of motion. She exhibits no edema and no tenderness.  Lymphadenopathy:    She has no cervical adenopathy.  Neurological: She is alert and oriented to person, place, and time. She has normal reflexes. No cranial nerve deficit. She exhibits normal muscle tone. Coordination normal.  Skin: Skin is warm and dry. No rash noted. She is not diaphoretic. No erythema. No pallor.  Psychiatric: She has a normal mood and affect. Her behavior is normal. Judgment and thought content normal.           Assessment & Plan:

## 2011-10-30 LAB — HYPERSENSITIVITY PNUEMONITIS PROFILE

## 2011-11-03 ENCOUNTER — Ambulatory Visit: Payer: BC Managed Care – PPO | Admitting: Internal Medicine

## 2011-11-10 ENCOUNTER — Telehealth: Payer: Self-pay | Admitting: Internal Medicine

## 2011-11-10 DIAGNOSIS — J849 Interstitial pulmonary disease, unspecified: Secondary | ICD-10-CM

## 2011-11-10 NOTE — Telephone Encounter (Signed)
I spoke with pt and she states she wants to go ahead and have the bronchoscopy set up. Pt states her sleep evaluation is not until 12/08/10 and feels she does not want to wait that long. She states she wants to go ahead and find out what is wrong with her. She states if Dr. Marchelle Gearing wants to bring her back in to discuss this then she would be fine to do so. Please advise Dr. Marchelle Gearing, thanks

## 2011-11-10 NOTE — Telephone Encounter (Signed)
The results are in EPIC. Under comments it says none detected for each one. Per [facility] this is the results.  Carron Curie, CMA

## 2011-11-13 NOTE — Telephone Encounter (Signed)
Spoke to patient. She says she had overnight oxygen test and in that she did not desaturate much. Therefore  1. Please cancel her appointment with sleep specialist. She is not interested in pursuing this at this point. She does not think she is sleep apnea  2. please set up bronchoscopy with regular bronchoscope, bronchoalveolar lavage and transbronchial biopsy. I need fluoroscopy. The patient is not at TB risk. I would prefer to do it on 11/19/2011 Thursday anytime in the morning because of the at hospital rounds. Preferred location is [place]

## 2011-11-13 NOTE — Telephone Encounter (Signed)
Appt is canceled with Dr. Craige Cotta and Julieta Bellini is set for 11-19-11 at 7:30am at [facility]. Bronch lab will contact the patient. Carron Curie, CMA

## 2011-11-18 ENCOUNTER — Encounter (HOSPITAL_COMMUNITY): Payer: Self-pay

## 2011-11-19 ENCOUNTER — Ambulatory Visit (HOSPITAL_COMMUNITY)
Admission: RE | Admit: 2011-11-19 | Discharge: 2011-11-19 | Disposition: A | Discharge status: Home / Self Care | Payer: BC Managed Care – PPO | Source: Ambulatory Visit | Attending: Internal Medicine | Admitting: Internal Medicine

## 2011-11-19 ENCOUNTER — Ambulatory Visit (HOSPITAL_COMMUNITY): Payer: BC Managed Care – PPO

## 2011-11-19 ENCOUNTER — Encounter (HOSPITAL_COMMUNITY)
Admission: RE | Disposition: A | Discharge status: Home / Self Care | Payer: Self-pay | Source: Ambulatory Visit | Attending: Internal Medicine

## 2011-11-19 ENCOUNTER — Encounter (HOSPITAL_COMMUNITY): Payer: Self-pay | Admitting: Internal Medicine

## 2011-11-19 DIAGNOSIS — J8410 Pulmonary fibrosis, unspecified: Secondary | ICD-10-CM | POA: Insufficient documentation

## 2011-11-19 DIAGNOSIS — J849 Interstitial pulmonary disease, unspecified: Secondary | ICD-10-CM

## 2011-11-19 DIAGNOSIS — J8409 Other alveolar and parieto-alveolar conditions: Secondary | ICD-10-CM

## 2011-11-19 DIAGNOSIS — J984 Other disorders of lung: Secondary | ICD-10-CM

## 2011-11-19 HISTORY — DX: Gastro-esophageal reflux disease without esophagitis: K21.9

## 2011-11-19 HISTORY — DX: Major depressive disorder, single episode, unspecified: F32.9

## 2011-11-19 HISTORY — DX: Unspecified osteoarthritis, unspecified site: M19.90

## 2011-11-19 HISTORY — PX: VIDEO BRONCHOSCOPY: SHX5072

## 2011-11-19 HISTORY — DX: Depression, unspecified: F32.A

## 2011-11-19 LAB — FUNGUS CULTURE W SMEAR: Fungal Smear: NONE SEEN

## 2011-11-19 SURGERY — BRONCHOSCOPY, WITH FLUOROSCOPY
Anesthesia: Moderate Sedation

## 2011-11-19 MED ORDER — SODIUM CHLORIDE 0.9 % IV SOLN
Freq: Once | INTRAVENOUS | Status: AC
  Administered 2011-11-19: 250 mL via INTRAVENOUS

## 2011-11-19 MED ORDER — FENTANYL CITRATE 0.05 MG/ML IJ SOLN
25.0000 ug | INTRAMUSCULAR | Status: DC | PRN
  Administered 2011-11-19: 25 ug via INTRAVENOUS
  Administered 2011-11-19: 50 ug via INTRAVENOUS
  Administered 2011-11-19: 25 ug via INTRAVENOUS

## 2011-11-19 MED ORDER — LIDOCAINE HCL (PF) 1 % IJ SOLN
INTRAMUSCULAR | Status: DC | PRN
  Administered 2011-11-19: 6 mL

## 2011-11-19 MED ORDER — FENTANYL CITRATE 0.05 MG/ML IJ SOLN
INTRAMUSCULAR | Status: AC
  Filled 2011-11-19: qty 4

## 2011-11-19 MED ORDER — LIDOCAINE HCL 2 % EX GEL
CUTANEOUS | Status: DC | PRN
  Administered 2011-11-19: 4 via TOPICAL

## 2011-11-19 MED ORDER — PHENYLEPHRINE HCL 0.5 % NA SOLN
NASAL | Status: DC | PRN
  Administered 2011-11-19: 2 [drp] via NASAL

## 2011-11-19 MED ORDER — MIDAZOLAM HCL 10 MG/10ML IJ SOLN
1.0000 mg | INTRAMUSCULAR | Status: DC | PRN
  Administered 2011-11-19: 2 mg via INTRAVENOUS
  Administered 2011-11-19: 1 mg via INTRAVENOUS

## 2011-11-19 MED ORDER — PHENYLEPHRINE HCL 0.25 % NA SOLN: 1.0000 | Freq: Four times a day (QID) | NASAL | Status: DC | PRN

## 2011-11-19 MED ORDER — LIDOCAINE HCL 2 % EX GEL: Freq: Once | CUTANEOUS | Status: DC

## 2011-11-19 MED ORDER — MIDAZOLAM HCL 10 MG/2ML IJ SOLN
INTRAMUSCULAR | Status: AC
  Filled 2011-11-19: qty 4

## 2011-11-19 NOTE — H&P (Signed)
  Today is day of procedure. Last seen in office < 30 days. H and P reviewed. Her bronchitis is better. Current NPO status confirmed. No new medical issues. Vitals ok. Exam unchanged.  Ok to proceed with moderate sedation and procedure. Risks explained again. Dtr Maralyn Sago with her - updated pre procedure

## 2011-11-19 NOTE — Op Note (Signed)
bronchoscopy  After application of topical anesthesia and induction of moderate sedation, the bronchoscope was passed through the left naris. The epiglottis was visualized was normal the vocal cords were normal. After further application of topical anesthesia lidocaine the upper airway was entered the trachea looked normal along with the tracheal rings. The carina for the down was normal. A detailed airway exam was performed the. The right main bronchus, bronchus intermedius, take off to all the lobes and subsegments all were normal without any endobronchial lesion. The only exception was the entrance to the right middle lobe looked a little bit narrow but again no endobronchial lesion.    We then did bronchoalveolar lavage of the right middle lobe using 25 cc aliquots x5. The first the first 2 were was pinkish in color. This was separated subsequent aliquots was also pinkish in color without any color change or perhaps a slight reduction in the pinkish  hue. but again overall pinkish in color. Definitely not blood   After this transbronchial biopsy x4 was done in the right upper lobe anterior segment where it was felt does lot of interstitial lung disease. No bleeding or pneumothorax was observed   After this detail they were examined the left side was performed and this was normal  Impression Normal airway exam except slightly narrow orifice to the right middle lobe Bronchial wash with pinkish returns of the right middle lobe  Status post transrectal biopsies of the right upper lobe anterior segment   Complications  None. Fluoroscopy did not reveal any pneumothorax. Patient tolerated the procedure well

## 2011-11-20 ENCOUNTER — Encounter (HOSPITAL_COMMUNITY): Payer: Self-pay | Admitting: Internal Medicine

## 2011-11-20 LAB — PNEUMOCYSTIS JIROVECI SMEAR BY DFA

## 2011-11-22 LAB — CULTURE, BAL-QUANTITATIVE W GRAM STAIN
Colony Count: >=100000
Gram Stain: NONE SEEN

## 2011-11-25 ENCOUNTER — Encounter: Payer: Self-pay | Admitting: Internal Medicine

## 2011-11-25 LAB — LEGIONELLA CULTURE

## 2011-11-30 ENCOUNTER — Telehealth: Payer: Self-pay | Admitting: Internal Medicine

## 2011-11-30 NOTE — Telephone Encounter (Signed)
LMOMTCB to advise will have to forward to Dr. Marchelle Gearing to see if results are back yet or not. Please advise if you have pt's brinch results MR, thanks

## 2011-11-30 NOTE — Telephone Encounter (Signed)
Pt scheduled 12/09/11

## 2011-11-30 NOTE — Telephone Encounter (Signed)
lmomtcb for pt 

## 2011-11-30 NOTE — Telephone Encounter (Signed)
Got them only late Friday. Please have her come in to discuss. Give appt. As I had told her pre-bronch and in office chances of getting specific diagnosis was low. And indeed we do not have a specific diagnosis. We call this non-diagnostic study but it rules out several causes. I would like to discuss face to face how to proceed next. Give appt at her convenencience but within 1-2 weeks  Thanks  MR

## 2011-12-09 ENCOUNTER — Encounter: Payer: Self-pay | Admitting: Internal Medicine

## 2011-12-09 ENCOUNTER — Ambulatory Visit (INDEPENDENT_AMBULATORY_CARE_PROVIDER_SITE_OTHER): Payer: BC Managed Care – PPO | Admitting: Internal Medicine

## 2011-12-09 ENCOUNTER — Institutional Professional Consult (permissible substitution): Payer: BC Managed Care – PPO | Admitting: Pulmonary Disease

## 2011-12-09 VITALS — BP 120/80 | HR 61 | Temp 97.1°F | Ht 61.0 in | Wt 164.8 lb

## 2011-12-09 DIAGNOSIS — J849 Interstitial pulmonary disease, unspecified: Secondary | ICD-10-CM

## 2011-12-09 DIAGNOSIS — J84115 Respiratory bronchiolitis interstitial lung disease: Secondary | ICD-10-CM

## 2011-12-09 DIAGNOSIS — R000 Tachycardia, unspecified: Secondary | ICD-10-CM

## 2011-12-09 NOTE — Patient Instructions (Signed)
Our options are VATS lung biopsy first, versus observation versus empiric prednisone versus 2nd opinion from Dr Katrine Coho at [facility] Please think about the different options and get  Back to me MEanwhile, please see Dr Daleen Squibb in cardiology about fast heart rate and continued metoprolol - my nurse  Will make a referral

## 2011-12-09 NOTE — Progress Notes (Signed)
Subjective:    Patient ID: Rhonda [name], female    DOB: Sep 12, 1951, 61 y.o.   MRN: [id number]  HPI  OV 09/15/11: 61 year old, non-smoker. GE Reflux with PPI. 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 Admitted 08/19/11 - 08/20/11 with acute exacerbation of chronic dyspnea. Noted to be hypoxemic. No specific diagnosis given but discharged on prednisone burst and antibiotic burst. But really her problem is dyspnea for several years. 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 But as an adult, insidious onset of dyspnea for few years. Associated with cough and small amounts of thick white sputum for a few years as well esp early in the morning. Dyspnea brought on by walking any  Incline and on many occassions climbing 1 flight of stairs. Associated dizziness +. Able to swim without problem (swims alt day x several years). Rates dyspnea as progressive and as moderate.  Denies chest pain but occ feels some rib pain. BEnding makes dyspnea worse.  Walked on room aior - pulse ox at rest 92%. AFter exertin 185 ft x 3 laps: pulse ox 90%. HR 108  Cough present for 1 year. RSI score 15 with PPI and 18 without PPI. Ass clearing of throat all the time present. Associated small amount of sputum +. Symptoms imprved by nasal steroid spray. Noted to have allergies and on singulair for several years. Also xyzal in morning. Also on asmanex for several years. Noted to be on beta blocker lopressor.   Review of chart shows 10/15/09 recollects dyspnea on exertion on a trip to [place]. CT chest ruled out PE and reported as normal parenchyma (non-specific ILD in my opinion). Current 08/19/11 cxr shows some peri-bronchial thickening.   OV 10/29/2011 After last visit got PFTs. Reviewed pft dated 10/05/11. - PFTs normal lung strength and mechanics but low diffusion. So got  CT chest 10/13/11 and had Dr Fredirick Lathe review it: She felt, "Looking back at her study on 10/15/09, she did have ILD at that time and it has progressed slightly on the current study of 10/13/11.  I would consider post-inflammatory fibrosis, chronic HP or less likely NSIP.  No honeycombing to suggest UIP, and there are areas of more central parenchymal involvement which also mitigates against UIP". Then, he had autoimmune panel: 10/23/11: Autoimmune panel: ANA, dsDNA, ccp, RF, scl70, ssA, ssB - all negative. ACE level normal. HP panel - pending  Today fu for above: now cold x 3 days, sore throat x 3 days, lot of people in community sick. Mild congestion. Coughing greenish brown sputum esp in morning - small amounts, increased in amounts by hot shower or mucinex. Walking in office: desaturated again  Snores a lot - awful per hsuband but states no witnessed apneic spells. No Xs day time somnolence but takes 3pm naps   OV 12/09/2011 FU test ILD and cough. For ILD here to discuss result:: Autoimmine and HP panel negative. Uderwennt bronch 11/19/11  - non diagnostic. 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 No change. Noticed it when climbing steps or searching for dog in yard or bending. Walking pulse ox unchanged with lowest pulse ox 90%.  Tbbx. In terms of cough: using flonase properly and cough and sinus drainage improved. RSI cough score improved to 8. In terms of sleep apnea: overnight oxygen study  was normal and decline sleep apnea workup.  Has gotten rid of bird and   New symptoims last month: vibration x 1 month, onset pre-bronch, feels a vibrating cell phone like noise or bubbling noise in left side of chest. States she is unable to feel it with hand but can hear it. Not related to eating. Does not happen with sleep. Can happen randomly. Lasts 3-5 minutes  Hx re-take with husband and patient: long standing hx with bronchitis episodes and dyspnea even in teen years. Reports recent  issues handling URIs - has severe wheezing and dyspnea when gets URI. PRednisone once helped.  Husband reports that patient cleaned bird cage daily (2 birds) very dirty and full of dust. Denies feather pillows  Review of Systems  Constitutional: Negative for fever and unexpected weight change.  HENT: Positive for congestion and postnasal drip. Negative for ear pain, nosebleeds, sore throat, rhinorrhea, sneezing, trouble swallowing, dental problem and sinus pressure.   Eyes: Negative for redness and itching.  Respiratory: Positive for cough and shortness of breath. Negative for chest tightness and wheezing.   Cardiovascular: Negative for palpitations and leg swelling.  Gastrointestinal: Negative for nausea and vomiting.  Genitourinary: Negative for dysuria.  Musculoskeletal: Negative for joint swelling.  Skin: Negative for rash.  Neurological: Positive for headaches.  Hematological: Does not bruise/bleed easily.  Psychiatric/Behavioral: Negative for dysphoric mood. The patient is not nervous/anxious.        Objective:   Physical Exam  itals reviewed. Constitutional: She is oriented to person, place, and time. She appears well-developed and well-nourished. No distress.       overweight  HENT:  Head: Normocephalic and atraumatic.  Right Ear: External ear normal.  Left Ear: External ear normal.  Mouth/Throat: Oropharynx is clear and moist. No oropharyngeal exudate.  Eyes: Conjunctivae and EOM are normal. Pupils are equal, round, and reactive to light. Right eye exhibits no discharge. Left eye exhibits no discharge. No scleral icterus.  Neck: Normal range of motion. Neck supple. No JVD present. No tracheal deviation present. No thyromegaly present.       mallampatti class 2-3  Cardiovascular: Normal rate, regular rhythm, normal heart sounds and intact distal pulses.  Exam reveals no gallop and no friction rub.   No murmur heard. Pulmonary/Chest: Effort normal and breath sounds normal. No  respiratory distress. She has no wheezes. She has no rales. She exhibits no tenderness.  Abdominal: Soft. Bowel sounds are normal. She exhibits no distension and no mass. There is no tenderness. There is no rebound and no guarding.  Musculoskeletal: Normal range of motion. She exhibits no edema and no tenderness.  Lymphadenopathy:    She has no cervical adenopathy.  Neurological: She is alert and oriented to person, place, and time. She has normal reflexes. No cranial nerve deficit. She exhibits normal muscle tone. Coordination normal.  Skin: Skin is warm and dry. No rash noted. She is not diaphoretic. No erythema. No pallor.  Psychiatric: She has a normal mood and affect. Her behavior is normal. Judgment and thought content normal.              Assessment & Plan:

## 2011-12-11 ENCOUNTER — Encounter: Payer: Self-pay | Admitting: Internal Medicine

## 2011-12-11 NOTE — Assessment & Plan Note (Signed)
Non diagnostic bronch. Explained that concept. Explained that options of watching (but know that ILD has progressed) in the absence of birds, oil painting v empiric prednisone v biopsy to get to know pathology (advantage of knowign specific diagnosis and prognosis) explained. Risks (low) of VATS bx explained. Explained my recommendatin is for biopsy first. She wants to think about it. Long term side effects of prednisone also explained. OFfered 2nd opinion at [facility].  She will think about all this and get back to me  > 50% of this 15 minute visit converted to > 25 min visit spent in face to face counseling

## 2011-12-16 ENCOUNTER — Ambulatory Visit (INDEPENDENT_AMBULATORY_CARE_PROVIDER_SITE_OTHER): Payer: BC Managed Care – PPO | Admitting: Physician Assistant

## 2011-12-16 ENCOUNTER — Encounter: Payer: Self-pay | Admitting: Physician Assistant

## 2011-12-16 DIAGNOSIS — R0602 Shortness of breath: Secondary | ICD-10-CM

## 2011-12-16 DIAGNOSIS — R002 Palpitations: Secondary | ICD-10-CM | POA: Insufficient documentation

## 2011-12-16 DIAGNOSIS — R0609 Other forms of dyspnea: Secondary | ICD-10-CM

## 2011-12-16 DIAGNOSIS — J849 Interstitial pulmonary disease, unspecified: Secondary | ICD-10-CM

## 2011-12-16 DIAGNOSIS — J84115 Respiratory bronchiolitis interstitial lung disease: Secondary | ICD-10-CM

## 2011-12-16 MED ORDER — DILTIAZEM HCL ER COATED BEADS 120 MG PO CP24: 120.0000 mg | ORAL_CAPSULE | Freq: Every day | ORAL | Status: DC

## 2011-12-16 NOTE — Assessment & Plan Note (Signed)
Management per pulmonary. 

## 2011-12-16 NOTE — Patient Instructions (Signed)
Your physician has recommended you make the following change in your medication: OVER THE NEXT 2 DAYS WE WOULD LIKE FOR YOU TAKE 1/2 TABLET A DAY OF YOUR TOPROL THEN STOP; ONCE YOU HAVE STOPPED THE TOPROL YOU ARE THEN TO START CARDIZEM CD 120 MG 1 TABLET DAILY.  Your physician recommends that you schedule a follow-up appointment in: 6-8 WEEKS WITH DR. WALL PER SCOTT WEAVER, PA-C

## 2011-12-16 NOTE — Progress Notes (Signed)
[address] [place], [place]  [address] Phone: [phone] Fax:  [phone]  Date:  12/16/2011   Name:  Rhonda [name]       DOB:  28-Aug-1951 MRN:  [id number]  PCP:  Dr. Hal Hope  Primary Cardiologist:  Dr. Valera Castle  Primary Electrophysiologist:  None    History of Present Illness: Rhonda [name] is a 61 y.o. female who presents to discuss her medications.  She has a history of hyperlipidemia, GERD and depression.  She was evaluated by Dr. Daleen Squibb 5/11 for increased dyspnea.  She was set up for a stress Myoview study.  This was performed 04/24/10: She was switched from a stress study to Lexiscan due to increased shortness of breath.  Images demonstrated an EF of 76%, no scar or ischemia.  She apparently has a history of an echocardiogram in 8/03 with evidence of mild mitral regurgitation and mitral valve thickening.  She has recently been followed but pulmonology and has been diagnosed with interstitial lung disease.  She had recent bronchoscopy which was unrevealing.  Autoimmune workup was also done within normal limits.  VATS biopsy has been recommended.  She's also been asked if she would like a second opinion at [facility].  She has had difficulty with dyspnea with exertion over the last 2-3 years.  She was admitted to the hospital in September with probable pneumonia.  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  She currently does not have hypoxia with ambulation as she did previously.  She has a long history of rapid heart rates.  This is how she was placed on Toprol many years ago by her PCP.  It was increased some years ago due to increased palpitations.  She has been reviewing her medications and noted that Toprol may cause difficulty with shortness of breath.  She questioned whether or not limiting this may be helpful for her.  She discussed this with Dr. Marchelle Gearing.  She decided to be seen today to see if the medication can be changed.  She notes  occasional palpitations.  This is mainly with ambulation or exertion which contributes to shortness of breath and she feels an elevated heart rate.  She denies chest discomfort.  She denies syncope.  She denies orthopnea, PND or edema.  Past Medical History  Diagnosis Date  . SOB (shortness of breath)   . High cholesterol   . History of chronic bronchitis     as child  . GERD (gastroesophageal reflux disease)   . Arthritis   . Depression     Current Outpatient Prescriptions  Medication Sig Dispense Refill  . acetaminophen (TYLENOL) 500 MG tablet Take 500 mg by mouth every 6 (six) hours as needed.        Marland Kitchen albuterol (PROVENTIL) (2.5 MG/3ML) 0.083% nebulizer solution Take 2.5 mg by nebulization every 4 (four) hours as needed.        . benzonatate (TESSALON) 100 MG capsule Take by mouth as needed.        . calcium carbonate (TUMS) 500 MG chewable tablet Chew 1 tablet by mouth daily.        Marland Kitchen FLUoxetine (PROZAC) 20 MG tablet Take 20 mg by mouth. Takes 1 pill every 3 days      . fluticasone (FLONASE) 50 MCG/ACT nasal spray Place 2 sprays into the nose 2 (two) times daily.       Marland Kitchen guaiFENesin (MUCINEX) 600 MG 12 hr tablet Take 1,200  mg by mouth as needed.       Marland Kitchen levocetirizine (XYZAL) 5 MG tablet Take 5 mg by mouth daily.        . metoprolol (TOPROL-XL) 50 MG 24 hr tablet Take 50 mg by mouth daily.        . mometasone (ASMANEX) 220 MCG/INH inhaler Inhale 1 puff into the lungs daily.       . montelukast (SINGULAIR) 10 MG tablet Take 10 mg by mouth at bedtime.        Marland Kitchen omeprazole (PRILOSEC) 20 MG capsule Take 20 mg by mouth daily.        . pravastatin (PRAVACHOL) 40 MG tablet Take 40 mg by mouth daily.       Marland Kitchen SINGULAIR 10 MG tablet daily.       . vitamin C (ASCORBIC ACID) 500 MG tablet Take 1,000 mg by mouth as needed.         Allergies: Allergies  Allergen Reactions  . Atorvastatin   . Codeine     REACTION: nausea  . Povidone-Iodine   . Rosuvastatin   . Sulfonamide Derivatives      History  Substance Use Topics  . Smoking status: Never Smoker   . Smokeless tobacco: Not on file   Comment: pt states she experimented in college  . Alcohol Use: 0.5 oz/week    1 drink(s) per week     ROS:  Please see the history of present illness.   Recent URI symptoms.  All other systems reviewed and negative.   PHYSICAL EXAM: VS:  BP 140/78  Pulse 80  Resp 18  Ht 5\' 1"  (1.549 m)  Wt 163 lb 12.8 oz (74.299 kg)  BMI 30.95 kg/m2  SpO2 96% Well nourished, well developed, in no acute distress HEENT: normal Neck: no JVD Vascular: No carotid bruits Cardiac:  normal S1, S2; RRR; no murmur Lungs:  clear to auscultation bilaterally, no wheezing, rhonchi or rales Abd: soft, nontender, no hepatomegaly Ext: no edema Skin: warm and dry Neuro:  CNs 2-12 intact, no focal abnormalities noted  EKG:   Sinus rhythm, heart rate 72, normal axis, subtle T-wave inversions in leads 2, 3, V3-V5, no significant change compared to prior tracing in 5/11  ASSESSMENT AND PLAN:

## 2011-12-16 NOTE — Assessment & Plan Note (Addendum)
She has had a long history of this.  She also has a long history of reactive airway disease prior to her symptoms that prompted her workup for interstitial lung disease.  She is on several inhalers.  It is not unreasonable to consider discontinuing her beta blocker to see if this helps her symptoms.  Given her history, I would suggest that she have an alternative therapy started after discontinuing her beta blocker.  We had a long discussion regarding this.  She will taper off of Toprol and start on Cardizem CD 120 mg daily.  Her palpitations mainly sounds like an elevated heart rate in response to activity.  At this point, I do not think a monitor would be helpful.  I will bring her back in followup with Dr. Daleen Squibb in 6-8 weeks.  If her symptoms are more prominent at that time, an event monitor can certainly be considered.

## 2011-12-16 NOTE — Assessment & Plan Note (Signed)
As noted, she had a negative Myoview in 2011.  Symptoms are related to interstitial lung disease.  She's had extensive workup with pulmonary.  At this point, I do not think an echocardiogram would be helpful.  I will hold off on ordering this.

## 2011-12-27 ENCOUNTER — Other Ambulatory Visit: Payer: Self-pay | Admitting: Family Medicine

## 2011-12-27 DIAGNOSIS — J45909 Unspecified asthma, uncomplicated: Secondary | ICD-10-CM

## 2012-01-03 LAB — AFB CULTURE WITH SMEAR (NOT AT ARMC)

## 2012-01-18 ENCOUNTER — Ambulatory Visit (INDEPENDENT_AMBULATORY_CARE_PROVIDER_SITE_OTHER): Payer: BC Managed Care – PPO | Admitting: Family Medicine

## 2012-01-18 ENCOUNTER — Encounter: Payer: Self-pay | Admitting: Family Medicine

## 2012-01-18 DIAGNOSIS — J849 Interstitial pulmonary disease, unspecified: Secondary | ICD-10-CM

## 2012-01-18 DIAGNOSIS — J64 Unspecified pneumoconiosis: Secondary | ICD-10-CM

## 2012-01-18 DIAGNOSIS — R739 Hyperglycemia, unspecified: Secondary | ICD-10-CM

## 2012-01-18 DIAGNOSIS — R5383 Other fatigue: Secondary | ICD-10-CM

## 2012-01-18 DIAGNOSIS — F329 Major depressive disorder, single episode, unspecified: Secondary | ICD-10-CM

## 2012-01-18 DIAGNOSIS — E785 Hyperlipidemia, unspecified: Secondary | ICD-10-CM

## 2012-01-18 DIAGNOSIS — R0602 Shortness of breath: Secondary | ICD-10-CM

## 2012-01-18 LAB — TSH: TSH: 2.569 u[IU]/mL (ref 0.350–4.500)

## 2012-01-18 NOTE — Progress Notes (Signed)
  Subjective:    Patient ID: Rhonda [name], female    DOB: 1951/04/04, 61 y.o.   MRN: [id number]  HPI Patient presents for routine follow up.  Hospitalized overnight for pneumonia.  Initially thought pertussis.  Follow up with ID who was not convinced patients symptoms were secondary to pertussis. Eventually saw Dr. Jearld Shines.  Extensive work up, to include blood work, imaging of the chest and bronchoscopy, now points to interstitial lung disease of unclear etiology. Patient chose not to undergo open lung biopsy and declined prednisone given her concerns that prednisone would affect her immune system and her immune system was already severely affected.  Patient ontinues to experience DOE, and a nonproductive cough that is worse in the am.  Flonase has been helpful and discontinued using her Asmanex and Proventil as patient did not feel these were benefiting her.  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  Patient continues to swim for pulmonary fitness.   Review of Systems     Objective:   Physical Exam  Constitutional: She appears well-developed and well-nourished.  Neck: Neck supple. No JVD present. Carotid bruit is not present. No thyromegaly present.  Cardiovascular: Normal rate, regular rhythm and normal heart sounds.   Pulmonary/Chest: Effort normal and breath sounds normal.  Abdominal: There is no hepatosplenomegaly.  Neurological: She is alert. She has normal strength and normal reflexes.  Skin: Skin is warm.    6 minute walk with pulse oximetry 93%    Assessment & Plan:   1. ILD (interstitial lung disease)    2. Hyperglycemia  POCT glycosylated hemoglobin (Hb A1C)  3. Dyslipidemia  Lipid panel, Comprehensive metabolic panel  4. Fatigue  TSH, Vitamin D 1,25 dihydroxy  5. SOB (shortness of breath)  Pulse oximetry (single), Pulse oximetry (single)  6. HYPERLIPIDEMIA-MIXED    7. DEPRESSION     Counseling and anticipatory guidance

## 2012-01-19 LAB — COMPREHENSIVE METABOLIC PANEL
ALT: 20 U/L (ref 0–35)
AST: 23 U/L (ref 0–37)
Albumin: 4.1 g/dL (ref 3.5–5.2)
Alkaline Phosphatase: 78 U/L (ref 39–117)
Potassium: 4.6 mEq/L (ref 3.5–5.3)
Sodium: 140 mEq/L (ref 135–145)
Total Bilirubin: 0.5 mg/dL (ref 0.3–1.2)
Total Protein: 7.1 g/dL (ref 6.0–8.3)

## 2012-01-19 LAB — LIPID PANEL
LDL Cholesterol: 124 mg/dL — ABNORMAL HIGH (ref 0–99)
VLDL: 37 mg/dL (ref 0–40)

## 2012-01-20 LAB — VITAMIN D 1,25 DIHYDROXY
Vitamin D 1, 25 (OH)2 Total: 62 pg/mL (ref 18–72)
Vitamin D2 1, 25 (OH)2: <8 pg/mL

## 2012-01-23 ENCOUNTER — Encounter: Payer: Self-pay | Admitting: Family Medicine

## 2012-01-23 NOTE — Progress Notes (Signed)
Quick Note:  Please let patient know their cholesterol was elevated. Patient to continue with their efforts to modify her diet and continue exercising 3-5 days a week. Please send patient copy of her labs.  ______

## 2012-01-24 ENCOUNTER — Encounter: Payer: Self-pay | Admitting: *Deleted

## 2012-02-05 ENCOUNTER — Other Ambulatory Visit: Payer: Self-pay | Admitting: Physician Assistant

## 2012-02-05 ENCOUNTER — Other Ambulatory Visit: Payer: Self-pay | Admitting: Family Medicine

## 2012-02-06 ENCOUNTER — Telehealth: Payer: Self-pay

## 2012-02-06 NOTE — Telephone Encounter (Signed)
Please pull chart for MD and route msg

## 2012-02-06 NOTE — Telephone Encounter (Signed)
Pt states that she called in to request refills on all her meds but that the pharmacy stated she needed blood work, she just saw dr Hal Hope recently and we have mailed the blood work to her already   Please call   She needs metoprol, floxatine, singular  lezogetiridine and prevstatin  Not sure of any of the spelling

## 2012-02-06 NOTE — Telephone Encounter (Signed)
Dr. Hal Hope,  Can we refill these?

## 2012-02-06 NOTE — Telephone Encounter (Signed)
Please pull paper chart.  Thanks! 

## 2012-02-08 ENCOUNTER — Other Ambulatory Visit: Payer: Self-pay | Admitting: Family Medicine

## 2012-02-08 DIAGNOSIS — K219 Gastro-esophageal reflux disease without esophagitis: Secondary | ICD-10-CM

## 2012-02-08 DIAGNOSIS — E785 Hyperlipidemia, unspecified: Secondary | ICD-10-CM

## 2012-02-08 DIAGNOSIS — R0602 Shortness of breath: Secondary | ICD-10-CM

## 2012-02-08 DIAGNOSIS — F419 Anxiety disorder, unspecified: Secondary | ICD-10-CM

## 2012-02-08 DIAGNOSIS — R002 Palpitations: Secondary | ICD-10-CM

## 2012-02-08 MED ORDER — FLUTICASONE PROPIONATE 50 MCG/ACT NA SUSP: 2.0000 | Freq: Two times a day (BID) | NASAL | Status: DC

## 2012-02-08 MED ORDER — LEVOCETIRIZINE DIHYDROCHLORIDE 5 MG PO TABS: 5.0000 mg | ORAL_TABLET | Freq: Every day | ORAL | Status: DC

## 2012-02-08 MED ORDER — MONTELUKAST SODIUM 10 MG PO TABS: 10.0000 mg | ORAL_TABLET | Freq: Every day | ORAL | Status: DC

## 2012-02-08 MED ORDER — METOPROLOL SUCCINATE ER 50 MG PO TB24: 50.0000 mg | ORAL_TABLET | Freq: Every day | ORAL | Status: DC

## 2012-02-08 MED ORDER — PRAVASTATIN SODIUM 40 MG PO TABS: 40.0000 mg | ORAL_TABLET | Freq: Every day | ORAL | Status: DC

## 2012-02-08 MED ORDER — MOMETASONE FUROATE 220 MCG/INH IN AEPB: 1.0000 | INHALATION_SPRAY | Freq: Every day | RESPIRATORY_TRACT | Status: DC

## 2012-02-08 MED ORDER — OMEPRAZOLE 20 MG PO CPDR: 20.0000 mg | DELAYED_RELEASE_CAPSULE | Freq: Every day | ORAL | Status: DC

## 2012-02-08 MED ORDER — FLUOXETINE HCL 20 MG PO TABS: ORAL_TABLET | ORAL | Status: DC

## 2012-02-08 MED ORDER — DILTIAZEM HCL ER COATED BEADS 120 MG PO CP24: 120.0000 mg | ORAL_CAPSULE | Freq: Every day | ORAL | Status: DC

## 2012-02-08 NOTE — Telephone Encounter (Signed)
Chart in your box 

## 2012-02-08 NOTE — Telephone Encounter (Signed)
Spoke with patient. Medications refilled for 1 year.

## 2012-02-10 ENCOUNTER — Ambulatory Visit: Payer: BC Managed Care – PPO | Admitting: Cardiology

## 2012-02-29 ENCOUNTER — Telehealth: Payer: Self-pay

## 2012-02-29 ENCOUNTER — Telehealth: Payer: Self-pay | Admitting: Family Medicine

## 2012-02-29 DIAGNOSIS — R002 Palpitations: Secondary | ICD-10-CM

## 2012-02-29 MED ORDER — METOPROLOL SUCCINATE ER 50 MG PO TB24: 50.0000 mg | ORAL_TABLET | Freq: Every day | ORAL | Status: DC

## 2012-02-29 NOTE — Telephone Encounter (Signed)
Please let patient know Toprol XL 50 mg e prescribed to [facility].

## 2012-02-29 NOTE — Telephone Encounter (Signed)
Pt called and reported that when we sent in Rxs for her meds, we sent in diltiazem, but not her metoprolol. She had gone to her cardiologist and he temp stopped her metoprolol and put on diltiazem but the change didn't help and wasn't working for BP as well. She stopped the diltiazem and is now supposed to go back and cont taking the metoprolol XL 50 mg QD. Can we send in new Rx for this to [facility] in 90 day supplies? I wanted to check with you first Dr Hal Hope since it wasn't in OV notes this way. I have put med info in this phone message #[phone] plus 2 RFs as you put on her other meds, so that if you agree all you have to do is sign and close encounter.

## 2012-06-11 ENCOUNTER — Telehealth: Payer: Self-pay | Admitting: Internal Medicine

## 2012-06-11 NOTE — Telephone Encounter (Signed)
Rhonda [name]  Please give her appt to see me first avail in sept /octo 2013 with full PFt (not ordered) and office walk test. FYI: She  Last saw me Jan 2013 and had ILD that was  Progressive. We discusssed options of waiting/watching, 2nd opinion,  VATS biopsy, empiric prednisone. I Think the latter 2 spooked her but she was supposed to get back to me which she never did  Thanks MR

## 2012-06-13 NOTE — Telephone Encounter (Signed)
ATCx2, phone rang several times then line went dead. WCB.Carron Curie, CMA

## 2012-06-21 NOTE — Telephone Encounter (Signed)
Pt advised and appt set for pft and rov.Rhonda [name], CMA

## 2012-07-11 ENCOUNTER — Other Ambulatory Visit: Payer: Self-pay | Admitting: Family Medicine

## 2012-07-11 DIAGNOSIS — Z1231 Encounter for screening mammogram for malignant neoplasm of breast: Secondary | ICD-10-CM

## 2012-07-26 ENCOUNTER — Ambulatory Visit
Admission: RE | Admit: 2012-07-26 | Discharge: 2012-07-26 | Disposition: A | Discharge status: Home / Self Care | Payer: BC Managed Care – PPO | Source: Ambulatory Visit | Attending: Family Medicine | Admitting: Family Medicine

## 2012-07-26 DIAGNOSIS — Z1231 Encounter for screening mammogram for malignant neoplasm of breast: Secondary | ICD-10-CM

## 2012-08-11 ENCOUNTER — Ambulatory Visit (INDEPENDENT_AMBULATORY_CARE_PROVIDER_SITE_OTHER): Payer: BC Managed Care – PPO | Admitting: Internal Medicine

## 2012-08-11 ENCOUNTER — Telehealth: Payer: Self-pay | Admitting: Internal Medicine

## 2012-08-11 ENCOUNTER — Encounter: Payer: Self-pay | Admitting: Internal Medicine

## 2012-08-11 VITALS — BP 110/70 | HR 79 | Temp 98.2°F | Ht 61.0 in | Wt 159.0 lb

## 2012-08-11 DIAGNOSIS — Z23 Encounter for immunization: Secondary | ICD-10-CM

## 2012-08-11 DIAGNOSIS — J849 Interstitial pulmonary disease, unspecified: Secondary | ICD-10-CM

## 2012-08-11 DIAGNOSIS — J8410 Pulmonary fibrosis, unspecified: Secondary | ICD-10-CM

## 2012-08-11 LAB — PULMONARY FUNCTION TEST

## 2012-08-11 NOTE — Telephone Encounter (Signed)
Rhonda [name]  Please tell her that on 2nd t houghts as I was doing my notes on her , I feel that she needs CT chest. It wil hel the surgeon. Please also find out what she has decided about seeing the surgeon; she wanted her husband on board and wanted  To research the surgeons  Thanks  MR

## 2012-08-11 NOTE — Patient Instructions (Addendum)
Have flu shot today Your breathing test and walk test suggest that your INterstitial Lung disease is slowly progressive compared to nov 2012 I strongly recommend you get VATS procedure lung biopsy by our surgeons  - the rationale for biopsy is diagnostic certainty, therapeutic options and duration and anticipated approval of newer drugs in next few years  - your risk for complications from VATS biopsy is low; they will test for MRSA when you are admitted for biopsy  - You can meet with Drs Morton Peters or B Bartle or Tyrone Sage or [place] for the same Please call me after meeting with surgeon Return to see me after biopsy

## 2012-08-11 NOTE — Progress Notes (Signed)
PFT done today. 

## 2012-08-11 NOTE — Assessment & Plan Note (Addendum)
Though subjectively similar and DLCO on PFT is unchanged compared to Nov 2012, there is enough evidence to suggest she has progressive ILD. She has new rales on exam anteriorly. Her FVC and TLC are dropped though still within normal limits and she  Has new desaturation with sub-maximal exercise.  PLAN I have informed her as such Will get repeat CT chest I have strongly advised for VATS lung biopsy Rationale for biopsy from early diagnosis, to diagnostic certainty, Rx planning/duration and options if current drug trials are approved by 2015, all explained Low risk for all complications from VATS explained; she has agreed to meet with [facility] to hear from them about biopsy before deciding Despite extensive counseling she wants to think about Bx; she needs husband on board   > 50% of this > 25 min visit spent in face to face counseling   > 50% of this > 25 min visit spent in face to face counseling (15 min visit converted to 25 min)

## 2012-08-11 NOTE — Progress Notes (Signed)
Subjective:    Patient ID: Rhonda [name], female    DOB: [birth year], 61 y.o.   MRN: [id number]  HPI  IOV 09/15/11: 61 year old, non-smoker. GE Reflux with PPI. 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 Admitted 08/19/11 - 08/20/11 with acute exacerbation of chronic dyspnea. Noted to be hypoxemic. No specific diagnosis given but discharged on prednisone burst and antibiotic burst. But really her problem is dyspnea for several years. 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 But as an adult, insidious onset of dyspnea for few years. Associated with cough and small amounts of thick white sputum for a few years as well esp early in the morning. Dyspnea brought on by walking any  Incline and on many occassions climbing 1 flight of stairs. Associated dizziness +. Able to swim without problem (swims alt day x several years). Rates dyspnea as progressive and as moderate.  Denies chest pain but occ feels some rib pain. BEnding makes dyspnea worse.  Walked on room aior - pulse ox at rest 92%. AFter exertin 185 ft x 3 laps: pulse ox 90%. HR 108  Cough present for 1 year. RSI score 15 with PPI and 18 without PPI. Ass clearing of throat all the time present. Associated small amount of sputum +. Symptoms imprved by nasal steroid spray. Noted to have allergies and on singulair for several years. Also xyzal in morning. Also on asmanex for several years. Noted to be on beta blocker lopressor.   Review of chart shows 10/15/09 recollects dyspnea on exertion on a trip to [place]. CT chest ruled out PE and reported as normal parenchyma (non-specific ILD in my opinion). Current 08/19/11 cxr shows some peri-bronchial thickening.   OV 10/29/2011 After last visit got PFTs. Reviewed pft dated 10/05/11. - PFTs normal lung strength and mechanics but low diffusion. So  got CT chest 10/13/11 and had Dr Fredirick Lathe review it: She felt, "Looking back at her study on 10/15/09, she did have ILD at that time and it has progressed slightly on the current study of 10/13/11.  I would consider post-inflammatory fibrosis, chronic HP or less likely NSIP.  No honeycombing to suggest UIP, and there are areas of more central parenchymal involvement which also mitigates against UIP". Then, he had autoimmune panel: 10/23/11: Autoimmune panel: ANA, dsDNA, ccp, RF, scl70, ssA, ssB - all negative. ACE level normal. HP panel - pending  Today fu for above: now cold x 3 days, sore throat x 3 days, lot of people in community sick. Mild congestion. Coughing greenish brown sputum esp in morning - small amounts, increased in amounts by hot shower or mucinex. Walking in office: desaturated again  Snores a lot - awful per hsuband but states no witnessed apneic spells. No Xs day time somnolence but takes 3pm naps   OV 12/09/2011 FU test ILD and cough. For ILD here to discuss result:: Autoimmine and HP panel negative. Uderwennt bronch 11/19/11  - non diagnostic. 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 No change. Noticed it when climbing steps or searching for dog in yard or bending. Walking pulse ox unchanged with lowest pulse ox 90%.  Tbbx. In terms of cough: using flonase properly and cough and sinus drainage improved. RSI cough score improved to 8. In terms of sleep apnea: overnight oxygen study  was normal and decline sleep apnea workup.  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 States she is unable to feel it with hand but can hear it. Not related to eating. Does not happen with sleep. Can happen randomly. Lasts 3-5 minutes  Hx re-take with husband and patient: long standing hx with bronchitis episodes and dyspnea  even in teen years. Reports recent issues handling URIs - has severe wheezing and dyspnea when gets URI. PRednisone once helped.  Husband reports that patient cleaned bird cage daily (2 birds) very dirty and full of dust. Denies feather pillows  OV 08/11/2012  Since last visit in Nov 2012; decided against VATS.  So did not followup. I had to specially request she come in. [facility] tells me she did not want VATS bx due to fear of infection risk. Since last visit, reports 3# ? Intentional weight loss. Overall well. Feels dyspnea some better since a year ago but still walking hill or ocassionally with flight of stairs will get dyspneic. There are good days and bad day to this. Lifiting heavy garbage or bending over can cause dyspnea but able to do ADLs or yard work. No new associated rashes or joint issues other than chronic OA. She has cut down from work because she wants to avoid URI exposure from children.  She got rid of birds last year and has not gotten them back. She was away from oil painting but recently has restarted it.    Walking desaturation test 02/10/2012 185 feet x 3 laps: 92% and 3rd lap 88% but was 86% half way through 3rd lap., HR response 89/min to 102/min. This is worse than 2012  And Jan 2013 when she desaturated only to 90%  PFTs 08/11/2012  Shows worsening FVC, FEv1 and TLC but stable dlco cmpared to nov 2012. FVC is 2.22L/83% which is down from 2.6L. Fev1 is 1.7L/90% and down from 1.9L. TLC is 3.3L/79% down from 4.3L   Past Medical History  Diagnosis Date  . SOB (shortness of breath)   . High cholesterol   . History of chronic bronchitis     as child  . GERD (gastroesophageal reflux disease)   . Arthritis   . Depression      Family History  Problem Relation Age of Onset  . Emphysema Maternal Grandmother   . Asthma Mother   . Rheum arthritis Mother   . Rheum arthritis Father   . Lymphoma Father   . Cancer Maternal Grandfather      History   Social History  . Marital  Status: Married    Spouse Name: [name]    Number of Children: N/A  . Years of Education: N/A   Occupational History  . SELF EMPLOYED     speech patholoigists  .     Social History Main Topics  . Smoking status: Never Smoker   . Smokeless tobacco: Never Used   Comment: pt states she experimented in college  . Alcohol Use: 0.5 oz/week    1 drink(s) per week     once a wk.  . Drug Use: No  . Sexually Active: Not on file   Other Topics Concern  . Not on file   Social History Narrative  . No narrative on file     Allergies  Allergen Reactions  .  Atorvastatin   . Codeine     REACTION: nausea  . Povidone   . Rosuvastatin   . Sulfonamide Derivatives      Outpatient Prescriptions Prior to Visit  Medication Sig Dispense Refill  . acetaminophen (TYLENOL) 500 MG tablet Take 500 mg by mouth every 6 (six) hours as needed.        Marland Kitchen albuterol (PROVENTIL) (2.5 MG/3ML) 0.083% nebulizer solution Take 2.5 mg by nebulization every 4 (four) hours as needed.        . calcium carbonate (TUMS) 500 MG chewable tablet Chew 1 tablet by mouth daily.        . fluticasone (FLONASE) 50 MCG/ACT nasal spray Place 2 sprays into the nose 2 (two) times daily.  3 g  2  . levocetirizine (XYZAL) 5 MG tablet Take 1 tablet (5 mg total) by mouth daily.  90 tablet  2  . metoprolol succinate (TOPROL-XL) 50 MG 24 hr tablet Take 1 tablet (50 mg total) by mouth daily. Take with or immediately following a meal.  90 tablet  2  . montelukast (SINGULAIR) 10 MG tablet TAKE 1 TABLET EVERY DAY  30 tablet  5  . montelukast (SINGULAIR) 10 MG tablet Take 1 tablet (10 mg total) by mouth daily.  90 tablet  2  . omeprazole (PRILOSEC) 20 MG capsule Take 1 capsule (20 mg total) by mouth daily.  90 capsule  2  . pravastatin (PRAVACHOL) 40 MG tablet Take 1 tablet (40 mg total) by mouth daily.  90 tablet  2  . FLUoxetine (PROZAC) 20 MG tablet 1 po daily  90 tablet  2  . benzonatate (TESSALON) 100 MG capsule Take by mouth as needed.         Marland Kitchen guaiFENesin (MUCINEX) 600 MG 12 hr tablet Take 1,200 mg by mouth as needed.       . mometasone (ASMANEX) 220 MCG/INH inhaler Inhale 1 puff into the lungs daily.  3 Inhaler  2  . vitamin C (ASCORBIC ACID) 500 MG tablet Take 1,000 mg by mouth as needed.       . diltiazem (CARDIZEM CD) 120 MG 24 hr capsule Take 1 capsule (120 mg total) by mouth daily.  90 capsule  2      Review of Systems  Constitutional: Negative for fever, chills, diaphoresis, activity change, appetite change, fatigue and unexpected weight change.  HENT: Negative for hearing loss, ear pain, nosebleeds, congestion, sore throat, facial swelling, rhinorrhea, sneezing, trouble swallowing, voice change, postnasal drip, sinus pressure, tinnitus and ear discharge.   Eyes: Positive for itching. Negative for visual disturbance.  Respiratory: Positive for cough and shortness of breath. Negative for choking, chest tightness and wheezing.   Cardiovascular: Negative for chest pain, palpitations and leg swelling.  Gastrointestinal: Negative for nausea, vomiting, abdominal pain, constipation and blood in stool.  Genitourinary: Negative for difficulty urinating.  Musculoskeletal: Negative for myalgias, back pain, joint swelling, arthralgias and gait problem.  Skin: Negative for rash.  Neurological: Positive for dizziness. Negative for tremors, seizures, weakness, light-headedness, numbness and headaches.  Hematological: Does not bruise/bleed easily.  Psychiatric/Behavioral: Negative for confusion, disturbed wake/sleep cycle and agitation. The patient is not nervous/anxious.        Objective:   Physical Exam  Vitals reviewed. Constitutional: She is oriented to person, place, and time. She appears well-developed and well-nourished. No distress.       Body mass index is 30.04 kg/(m^2). 159# - says 3# weight losss since last OV in Nov 2012  HENT:  Head: Normocephalic and atraumatic.  Right Ear: External ear normal.  Left Ear:  External ear normal.  Mouth/Throat: Oropharynx is clear and moist. No oropharyngeal exudate.  Eyes: Conjunctivae normal and EOM are normal. Pupils are equal, round, and reactive to light. Right eye exhibits no discharge. Left eye exhibits no discharge. No scleral icterus.  Neck: Normal range of motion. Neck supple. No JVD present. No tracheal deviation present. No thyromegaly present.  Cardiovascular: Normal rate, regular rhythm, normal heart sounds and intact distal pulses.  Exam reveals no gallop and no friction rub.   No murmur heard. Pulmonary/Chest: Effort normal. No respiratory distress. She has no wheezes. She has rales. She exhibits no tenderness.       Rales anteriorly - this is NEW 08/11/2012  None posteriorly  Abdominal: Soft. Bowel sounds are normal. She exhibits no distension and no mass. There is no tenderness. There is no rebound and no guarding.  Musculoskeletal: Normal range of motion. She exhibits no edema and no tenderness.  Lymphadenopathy:    She has no cervical adenopathy.  Neurological: She is alert and oriented to person, place, and time. She has normal reflexes. No cranial nerve deficit. She exhibits normal muscle tone. Coordination normal.  Skin: Skin is warm and dry. No rash noted. She is not diaphoretic. No erythema. No pallor.  Psychiatric: She has a normal mood and affect. Her behavior is normal. Judgment and thought content normal.          Assessment & Plan:

## 2012-08-16 ENCOUNTER — Ambulatory Visit (INDEPENDENT_AMBULATORY_CARE_PROVIDER_SITE_OTHER)
Admission: RE | Admit: 2012-08-16 | Discharge: 2012-08-16 | Disposition: A | Discharge status: Home / Self Care | Payer: BC Managed Care – PPO | Source: Ambulatory Visit | Attending: Internal Medicine | Admitting: Internal Medicine

## 2012-08-16 DIAGNOSIS — J8410 Pulmonary fibrosis, unspecified: Secondary | ICD-10-CM

## 2012-08-16 DIAGNOSIS — J849 Interstitial pulmonary disease, unspecified: Secondary | ICD-10-CM

## 2012-08-16 DIAGNOSIS — Z23 Encounter for immunization: Secondary | ICD-10-CM

## 2012-08-17 NOTE — Telephone Encounter (Signed)
Ct is essentually unchanged from prior. ths means disease worsening is mild as evidence by walk test and breathing test but more importantl the CT will help the surgeon direct the lung bx which is main reason we did it. I am glad she is seeing the surgeon. I will see her after bx to discuss results

## 2012-08-17 NOTE — Telephone Encounter (Addendum)
[id number]. Ct has already been done. Need to ask pt if they have decided on surgeon. Carron Curie, CMA

## 2012-08-17 NOTE — Telephone Encounter (Signed)
Pt has an appt with Dr. Dorris Fetch in October. Pt had CT 08-16-12, pt asking for results.Carron Curie, CMA

## 2012-08-18 ENCOUNTER — Encounter: Payer: Self-pay | Admitting: Internal Medicine

## 2012-08-18 NOTE — Telephone Encounter (Signed)
Pt is aware. Jennifer Castillo, CMA  

## 2012-08-23 ENCOUNTER — Encounter: Payer: BC Managed Care – PPO | Admitting: Thoracic Surgery (Cardiothoracic Vascular Surgery)

## 2012-09-06 ENCOUNTER — Encounter: Payer: Self-pay | Admitting: Thoracic Surgery (Cardiothoracic Vascular Surgery)

## 2012-09-06 ENCOUNTER — Other Ambulatory Visit: Payer: Self-pay | Admitting: *Deleted

## 2012-09-06 ENCOUNTER — Institutional Professional Consult (permissible substitution) (INDEPENDENT_AMBULATORY_CARE_PROVIDER_SITE_OTHER): Payer: BC Managed Care – PPO | Admitting: Thoracic Surgery (Cardiothoracic Vascular Surgery)

## 2012-09-06 VITALS — BP 137/77 | HR 66 | Resp 18 | Ht 61.0 in | Wt 158.0 lb

## 2012-09-06 DIAGNOSIS — J849 Interstitial pulmonary disease, unspecified: Secondary | ICD-10-CM

## 2012-09-06 DIAGNOSIS — J8410 Pulmonary fibrosis, unspecified: Secondary | ICD-10-CM

## 2012-09-06 NOTE — Progress Notes (Signed)
PCP is Dois Davenport., MD Referring Provider is Kalman Shan, MD  Chief Complaint  Patient presents with  . Interstitial Lung Disease    Referral from Dr Marchelle Gearing for surgical eval on ILD    HPI: 61 year old woman presents with chief complaint of shortness of breath.  Rhonda [name] is a 61 year old woman who is followed by Dr. Marchelle Gearing for interstitial lung disease. She is a lifelong nonsmoker and nearly retired Doctor, general practice. She has a history of asthma dating back 3-4 years. However, she dates her current problems back to about a year ago when she had pneumonia. She was treated with antibiotics and steroids. Since that time she continued to have shortness of breath and decreased energy. She says her shortness of breath is not consistent. It will vary from day to day. Sometimes she will have it with relatively mild activities another time she's not had any with much heavier activities.  It was recommended to her that she have a lung biopsy a year ago. However she did not want to have it done.  She recently saw Dr. Marchelle Gearing. He noted that even though her symptoms were subjectively similar and DLCO on PFT was unchanged compared to Nov 2012, there was enough evidence to suggest she has progressive ILD. She had new rales on exam anteriorly. Her FVC and TLC are decreased, even though still within normal limits, and she had new desaturation with sub-maximal exercise. He therefore thought it was important for her to proceed with a lung biopsy, and she is now agreeable to consider it.    Past Medical History  Diagnosis Date  . SOB (shortness of breath)   . High cholesterol   . History of chronic bronchitis     as child  . GERD (gastroesophageal reflux disease)   . Arthritis   . Depression     Past Surgical History  Procedure Date  . Tubal ligation   . Foot surgery   . Video bronchoscopy 11/19/2011    Procedure: VIDEO BRONCHOSCOPY WITH FLUORO;  Surgeon: Kalman Shan, MD;   Location: [facility] ENDOSCOPY;  Service: Endoscopy;;    Family History  Problem Relation Age of Onset  . Emphysema Maternal Grandmother   . Asthma Mother   . Rheum arthritis Mother   . Rheum arthritis Father   . Lymphoma Father   . Cancer Maternal Grandfather     Social History History  Substance Use Topics  . Smoking status: Never Smoker   . Smokeless tobacco: Never Used   Comment: pt states she experimented in college  . Alcohol Use: 0.5 oz/week    1 drink(s) per week     once a wk.    Current Outpatient Prescriptions  Medication Sig Dispense Refill  . acetaminophen (TYLENOL) 500 MG tablet Take 500 mg by mouth every 6 (six) hours as needed.        Marland Kitchen albuterol (PROVENTIL) (2.5 MG/3ML) 0.083% nebulizer solution Take 2.5 mg by nebulization every 4 (four) hours as needed.        . benzonatate (TESSALON) 100 MG capsule Take by mouth as needed.        . calcium carbonate (TUMS) 500 MG chewable tablet Chew 1 tablet by mouth daily.        Marland Kitchen FLUoxetine (PROZAC) 20 MG tablet as needed. 1 po daily      . fluticasone (FLONASE) 50 MCG/ACT nasal spray Place 2 sprays into the nose 2 (two) times daily.  3 g  2  . guaiFENesin (MUCINEX) 600  MG 12 hr tablet Take 1,200 mg by mouth as needed.       Marland Kitchen levocetirizine (XYZAL) 5 MG tablet Take 1 tablet (5 mg total) by mouth daily.  90 tablet  2  . metoprolol succinate (TOPROL-XL) 50 MG 24 hr tablet Take 1 tablet (50 mg total) by mouth daily. Take with or immediately following a meal.  90 tablet  2  . mometasone (ASMANEX) 220 MCG/INH inhaler Inhale 1 puff into the lungs daily.  3 Inhaler  2  . montelukast (SINGULAIR) 10 MG tablet TAKE 1 TABLET EVERY DAY  30 tablet  5  . omeprazole (PRILOSEC) 20 MG capsule Take 1 capsule (20 mg total) by mouth daily.  90 capsule  2  . pravastatin (PRAVACHOL) 40 MG tablet Take 1 tablet (40 mg total) by mouth daily.  90 tablet  2  . DISCONTD: montelukast (SINGULAIR) 10 MG tablet Take 1 tablet (10 mg total) by mouth daily.  90  tablet  2    Allergies  Allergen Reactions  . Atorvastatin   . Codeine     REACTION: nausea  . Povidone   . Rosuvastatin   . Sulfonamide Derivatives     Review of Systems  Constitutional: Positive for fatigue (decreased energy).  HENT: Positive for hearing loss (mild).   Eyes:       Catarracts  Respiratory: Positive for shortness of breath (inconsistent).   Gastrointestinal:       Reflux  Genitourinary:       C-section  Musculoskeletal: Positive for arthralgias (fingers, hands).  All other systems reviewed and are negative.    BP 137/77  Pulse 66  Resp 18  Ht 5\' 1"  (1.549 m)  Wt 158 lb (71.668 kg)  BMI 29.85 kg/m2  SpO2 95% Physical Exam  Constitutional: She is oriented to person, place, and time. She appears well-developed and well-nourished. No distress.  HENT:  Head: Normocephalic and atraumatic.  Eyes: EOM are normal. Pupils are equal, round, and reactive to light.  Neck: Neck supple. No thyromegaly present.  Cardiovascular: Normal rate, regular rhythm, normal heart sounds and intact distal pulses.  Exam reveals no gallop and no friction rub.   No murmur heard. Pulmonary/Chest: Effort normal. She has rales (both bases).  Abdominal: Soft. There is no tenderness.  Musculoskeletal: She exhibits no edema.  Lymphadenopathy:    She has no cervical adenopathy.  Neurological: She is alert and oriented to person, place, and time. No cranial nerve deficit.  Skin: Skin is warm and dry.  Psychiatric: She has a normal mood and affect.     Diagnostic Tests: CT chest 08/16/12 RADIOLOGY REPORT*  Clinical Data: Clinically progressive interstitial lung disease.  Worsening hypoxia with exertion.  CT CHEST WITHOUT CONTRAST  Technique: Multidetector CT imaging of the chest was performed  following the standard protocol without IV contrast.Dedicated high  resolution images were also performed.  Comparison: 10/13/2011 and 10/15/2009.  Findings: Lung windows demonstrate no  airspace disease. Similar  pattern of interstitial lung disease. Patchy areas of  architectural distortion with minimal traction bronchiectasis and  bronchiolectasis. Minimal subpleural reticulation. No areas of  honeycombing. Scattered areas of mosaic attenuation. Examples  identified within the right upper lobe on image 23 of series 5 and  within the right lower lobe on image 25 of series 5. No cranial  caudal gradient.  Dedicated high resolution images confirm these findings. The areas  of mosaic attenuation are increased with expiration. This suggests  small airways disease causing mosaic perfusion.  Soft tissue windows demonstrate normal heart size without  pericardial or pleural effusion.  Multivessel coronary artery atherosclerosis. Right paratracheal  node which measures 6 mm and is unchanged.  A precarinal node measures 1.1 cm and is unchanged. Hilar regions  poorly evaluated secondary lack of IV contrast.  Small hiatal hernia.  Limited abdominal imaging demonstrates moderate hepatic steatosis.  No acute osseous abnormality. Lower thoracic discogenic sclerosis.  IMPRESSION:  1. Redemonstration of interstitial lung disease. No significant  change. Consolation of findings is nonspecific. Considerations  include nonspecific interstitial pneumonitis, atypical appearance  of usual interstitial pneumonitis, or post infectious fibrosis.  2. No evidence of acute superimposed pulmonary process.  3. Similar mild thoracic adenopathy, likely reactive.  4. Age advanced coronary artery atherosclerosis. Recommend  assessment of coronary risk factors and consideration of medical  therapy.  5. Small hiatal hernia.  6. Hepatic steatosis.  Impression: 61 year old woman with interstitial lung disease. Workup to date has been nonspecific. His been recommended to her that she have a lung biopsy for diagnostic and prognostic information and to guide therapy.  I discussed in detail with the  patient and her companion the nature of the procedure, the incisions to be used, the need for general anesthesia, the expected hospital stay, and overall recovery. She does understand that this is a diagnostic procedure and not a therapeutic one. I discussed in detail with her the indications, risks, benefits, and alternatives. She understands that the risk include, but are not limited to, death, MI, DVT, PE, bleeding, possible need for transfusion, infection, prolonged air leak, irregular heart rhythms, as well as other unforeseeable complications. She accepts these risks and wishes to proceed with right VATS, lung biopsy. She wishes to wait until early November to have the procedure done.  Plan: Right VATS, lung biopsy for interstitial lung disease on Wednesday, 09/28/2012

## 2012-09-13 ENCOUNTER — Telehealth: Payer: Self-pay

## 2012-09-13 ENCOUNTER — Encounter (HOSPITAL_COMMUNITY): Payer: Self-pay | Admitting: Pharmacy Technician

## 2012-09-13 DIAGNOSIS — E785 Hyperlipidemia, unspecified: Secondary | ICD-10-CM

## 2012-09-13 DIAGNOSIS — K219 Gastro-esophageal reflux disease without esophagitis: Secondary | ICD-10-CM

## 2012-09-13 DIAGNOSIS — R002 Palpitations: Secondary | ICD-10-CM

## 2012-09-13 MED ORDER — OMEPRAZOLE 20 MG PO CPDR: 20.0000 mg | DELAYED_RELEASE_CAPSULE | Freq: Every day | ORAL | Status: DC

## 2012-09-13 MED ORDER — LEVOCETIRIZINE DIHYDROCHLORIDE 5 MG PO TABS: 5.0000 mg | ORAL_TABLET | Freq: Every day | ORAL | Status: DC

## 2012-09-13 MED ORDER — METOPROLOL SUCCINATE ER 50 MG PO TB24: 50.0000 mg | ORAL_TABLET | Freq: Every day | ORAL | Status: DC

## 2012-09-13 MED ORDER — PRAVASTATIN SODIUM 40 MG PO TABS: 40.0000 mg | ORAL_TABLET | Freq: Every day | ORAL | Status: DC

## 2012-09-13 NOTE — Telephone Encounter (Signed)
I called patient and she is advised

## 2012-09-13 NOTE — Telephone Encounter (Signed)
I have refilled these for her for 90 days which should get her through to her appt in Dec when she can discuss with Dr. Audria Nine

## 2012-09-13 NOTE — Telephone Encounter (Signed)
This is a former Dr Hal Hope patient. Pt has scheduled a CPE with Dr Audria Nine in December, but states she may be out of meds before that appt and wants to know if they can be refilled until appt in December.  Patient also wanted to let us know she will be having a lung biopsy on 09/28/12

## 2012-09-13 NOTE — Telephone Encounter (Signed)
I spoke to patient. She needs to get her metoprolol 50mg , pravastatin 40mg , prozac 20mg , and xyzal 5mg  filled before her appointment in December with Dr. Audria Nine.  Please advise.

## 2012-09-15 ENCOUNTER — Ambulatory Visit (INDEPENDENT_AMBULATORY_CARE_PROVIDER_SITE_OTHER): Payer: BC Managed Care – PPO | Admitting: Internal Medicine

## 2012-09-15 VITALS — BP 136/74 | HR 67 | Temp 98.1°F | Resp 16 | Ht 60.5 in | Wt 156.0 lb

## 2012-09-15 DIAGNOSIS — J8410 Pulmonary fibrosis, unspecified: Secondary | ICD-10-CM

## 2012-09-15 DIAGNOSIS — R000 Tachycardia, unspecified: Secondary | ICD-10-CM | POA: Insufficient documentation

## 2012-09-15 DIAGNOSIS — A70 Chlamydia psittaci infections: Secondary | ICD-10-CM

## 2012-09-15 DIAGNOSIS — Z719 Counseling, unspecified: Secondary | ICD-10-CM

## 2012-09-15 MED ORDER — DOXYCYCLINE HYCLATE 100 MG PO TABS: 100.0000 mg | ORAL_TABLET | Freq: Two times a day (BID) | ORAL | Status: DC

## 2012-09-15 NOTE — Progress Notes (Signed)
  Subjective:    Patient ID: Rhonda [name], female    DOB: Nov 18, 1951, 61 y.o.   MRN: [id number]  HPI Scheduled for lung bx. She does not want to do it Read her complex hx, reviewed specialists report Read cxrs, ct scans x 3.   Review of Systems     Objective:   Physical Exam Oximetry 97 at rest, 95 with brisk exercise Lungs clear left, rhonchi right, no rales Counseled 30 min.       Assessment & Plan:  Doxycycline 100mg  bid 14 days Discuss with Dr. Marchelle Gearing again/ Have him call me if I can help

## 2012-09-15 NOTE — Patient Instructions (Addendum)
Psittacosis Psittacosis is an uncommon illness. It is caused by a germ called Chlamydia psittaci. The germ is carried by birds. Eg[employer] Garibaldi owners, Pharmacologist and veterinarians are most often affected. Outbreaks of psittacosis in poultry processing plants have been reported. The infection is spread by breathing in dried secretions from infected birds. The incubation period is 5 to 19 days. This means you get sick that length of time after exposure. All birds are susceptible. Pet birds (parrots, parakeets, [place], and Ambulance person) and Environmental education officer (turkeys and ducks) most often pass this illness to people. Usually the birds that give this to people are not sick. SYMPTOMS  The illness causes:  Fever.  Chills.  Headache.  Muscle aches.  Fatigue.  Shortness of breath.  Dry cough. Other problems may include:   Inflammation of the heart and liver.  Brain and spinal cord (neurologic) problems.  Severe pneumonia requiring intensive-care support. It rarely causes death. DIAGNOSIS  Your caregiver may suspect what is wrong by taking your history and doing a physical exam. X-rays may show pneumonia. Blood work may be done. TREATMENT  Your caregiver can usually easily treat psittacosis with antibiotics. Document Released: 12/30/2005 Document Revised: 02/01/2012 Document Reviewed: 02/17/2006 [facility] Patient Information 2013 [place], [place].

## 2012-09-19 ENCOUNTER — Encounter: Payer: Self-pay | Admitting: Internal Medicine

## 2012-09-19 ENCOUNTER — Ambulatory Visit (INDEPENDENT_AMBULATORY_CARE_PROVIDER_SITE_OTHER): Payer: BC Managed Care – PPO | Admitting: Internal Medicine

## 2012-09-19 VITALS — BP 120/80 | HR 72 | Temp 97.7°F | Ht 61.0 in | Wt 158.4 lb

## 2012-09-19 DIAGNOSIS — E8881 Metabolic syndrome: Secondary | ICD-10-CM

## 2012-09-19 DIAGNOSIS — J849 Interstitial pulmonary disease, unspecified: Secondary | ICD-10-CM

## 2012-09-19 DIAGNOSIS — J8410 Pulmonary fibrosis, unspecified: Secondary | ICD-10-CM

## 2012-09-19 NOTE — Patient Instructions (Addendum)
#  ILD  Discussed rationale for lung biopsy - still recommend it. OVerall risk is low for complications after biopsy Results could take 1 week to come back Please have MR Brooke Dare call our office 09/28/12 after biopsy  #Metabolic Syndrome  - post biopsy we can discuss this  #Followup  - after lung biopsy

## 2012-09-19 NOTE — Progress Notes (Signed)
Subjective:    Patient ID: Rhonda [name], female    DOB: [birth year], 61 y.o.   MRN: [id number]  HPI IOV 09/15/11: 61 year old, non-smoker. GE Reflux with PPI. 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 Admitted 08/19/11 - 08/20/11 with acute exacerbation of chronic dyspnea. Noted to be hypoxemic. No specific diagnosis given but discharged on prednisone burst and antibiotic burst. But really her problem is dyspnea for several years. 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 But as an adult, insidious onset of dyspnea for few years. Associated with cough and small amounts of thick white sputum for a few years as well esp early in the morning. Dyspnea brought on by walking any  Incline and on many occassions climbing 1 flight of stairs. Associated dizziness +. Able to swim without problem (swims alt day x several years). Rates dyspnea as progressive and as moderate.  Denies chest pain but occ feels some rib pain. BEnding makes dyspnea worse.  Walked on room aior - pulse ox at rest 92%. AFter exertin 185 ft x 3 laps: pulse ox 90%. HR 108  Cough present for 1 year. RSI score 15 with PPI and 18 without PPI. Ass clearing of throat all the time present. Associated small amount of sputum +. Symptoms imprved by nasal steroid spray. Noted to have allergies and on singulair for several years. Also xyzal in morning. Also on asmanex for several years. Noted to be on beta blocker lopressor.   Review of chart shows 10/15/09 recollects dyspnea on exertion on a trip to [place]. CT chest ruled out PE and reported as normal parenchyma (non-specific ILD in my opinion). Current 08/19/11 cxr shows some peri-bronchial thickening.   Note she has had 2003 CT chest - no film avaialble but reported as no "pulm fibrosis":  OV 10/29/2011 After last visit got PFTs.  Reviewed pft dated 10/05/11. - PFTs normal lung strength and mechanics but low diffusion. So got CT chest 10/13/11 and had Dr Fredirick Lathe review it: She felt, "Looking back at her study on 10/15/09, she did have ILD at that time and it has progressed slightly on the current study of 10/13/11.  I would consider post-inflammatory fibrosis, chronic HP or less likely NSIP.  No honeycombing to suggest UIP, and there are areas of more central parenchymal involvement which also mitigates against UIP". Then, he had autoimmune panel: 10/23/11: Autoimmune panel: ANA, dsDNA, ccp, RF, scl70, ssA, ssB - all negative. ACE level normal. HP panel - pending  Today fu for above: now cold x 3 days, sore throat x 3 days, lot of people in community sick. Mild congestion. Coughing greenish brown sputum esp in morning - small amounts, increased in amounts by hot shower or mucinex. Walking in office: desaturated again  Snores a lot - awful per hsuband but states no witnessed apneic spells. No Xs day time somnolence but takes 3pm naps   OV 12/09/2011 FU test ILD and cough. For ILD here to discuss result:: Autoimmine and HP panel negative. Uderwennt bronch 11/19/11  - non diagnostic. 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 No change. Noticed it when climbing steps or searching for dog in yard or bending. Walking pulse ox unchanged with lowest pulse ox 90%.  Tbbx. In terms of cough: using flonase properly and cough and  sinus drainage improved. RSI cough score improved to 8. In terms of sleep apnea: overnight oxygen study was normal and decline sleep apnea workup.  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 States she is unable to feel it with hand but can hear it. Not related to eating. Does not happen with sleep. Can happen randomly. Lasts 3-5  minutes  Hx re-take with husband and patient: long standing hx with bronchitis episodes and dyspnea even in teen years. Reports recent issues handling URIs - has severe wheezing and dyspnea when gets URI. PRednisone once helped.  Husband reports that patient cleaned bird cage daily (2 birds) very dirty and full of dust. Denies feather pillows  OV 08/11/2012  Since last visit in Nov 2012; decided against VATS.  So did not followup. I had to specially request she come in. [facility] tells me she did not want VATS bx due to fear of infection risk. Since last visit, reports 3# ? Intentional weight loss. Overall well. Feels dyspnea some better since a year ago but still walking hill or ocassionally with flight of stairs will get dyspneic. There are good days and bad day to this. Lifiting heavy garbage or bending over can cause dyspnea but able to do ADLs or yard work. No new associated rashes or joint issues other than chronic OA. She has cut down from work because she wants to avoid URI exposure from children.  She got rid of birds last year and has not gotten them back. She was away from oil painting but recently has restarted it.   Exam shows new crackles   Walking desaturation test 02/10/2012:  185 feet x 3 laps: 92% at rest but by  3rd lap 88% but was 86% half way through 3rd lap., HR response 89/min to 102/min. This is worse than 2012  And Jan 2013 when she desaturated only to 90%  PFTs 08/11/2012    - Shows worsening FVC, FEv1 and TLC but stable dlco cmpared to nov 2012.   - FVC is 2.22L/83% which is down from 2.6L. Fev1 is 1.7L/90% and down from 1.9L. TLC is 3.3L/79% down from 4.3L  CT chest 08/16/12  -  IMPRESSION:  1. Redemonstration of interstitial lung disease. No significant  change. Consolation of findings is nonspecific. Considerations  include nonspecific interstitial pneumonitis, atypical appearance  of usual interstitial pneumonitis, or post infectious fibrosis.  2. No evidence of acute  superimposed pulmonary process.  3. Similar mild thoracic adenopathy, likely reactive.  4. Age advanced coronary artery atherosclerosis. Recommend  assessment of coronary risk factors and consideration of medical  therapy.  5. Small hiatal hernia.  6. Hepatic steatosis.  REC Have flu shot today  Your breathing test and walk test suggest that your INterstitial Lung disease is slowly progressive compared to nov 2012  I strongly recommend you get VATS procedure lung biopsy by our surgeons  - the rationale for biopsy is diagnostic certainty, therapeutic options and duration and anticipated approval of newer drugs in next few years  - your risk for complications from VATS biopsy is low; they will test for MRSA when you are admitted for biopsy  - You can meet with Drs Morton Peters or B Bartle or Tyrone Sage or [place] for the same  Please call me after meeting with surgeon  Return to see  me after biopsy   OV 09/19/2012  VATS biopsy scheduled 09/28/12 but has questions . She is now here with husband. They are still concerned about indication for VATS lung biopsy and risk benefit ratio.     - She is wondering if this is bird related pneumonia and if she should take doxycycline:  . Says overall feelin great, doing ADLs. Swimming and losing weight. . Per report: walking desaturation test at PMD office was normal and did not go below 90s. However, when I question her more closely she swimming 9 laps and then mild dyspneic. Climbing steps with load esp increasing load or bending over can increase dyspnea. Bending still makes her dsypneic. Reports ongoing issue with GERD that is constant. Hiatal hernia is small. She now recognizes that her GERD increased 3 years ago around the time first time ILD was diagnosed   - Offered [facility]nd opinion at [facility]: refused Review of Systems  Constitutional: Negative for fever and unexpected weight change.  HENT: Negative for ear pain, nosebleeds, congestion, sore throat,  rhinorrhea, sneezing, trouble swallowing, dental problem, postnasal drip and sinus pressure.   Eyes: Negative for redness and itching.  Respiratory: Negative for cough, chest tightness, shortness of breath and wheezing.   Cardiovascular: Negative for palpitations and leg swelling.  Gastrointestinal: Negative for nausea and vomiting.  Genitourinary: Negative for dysuria.  Musculoskeletal: Negative for joint swelling.  Skin: Negative for rash.  Neurological: Negative for headaches.  Hematological: Does not bruise/bleed easily.  Psychiatric/Behavioral: Negative for dysphoric mood. The patient is not nervous/anxious.        Objective:   Physical Exam Vitals reviewed. Constitutional: She is oriented to person, place, and time. She appears well-developed and well-nourished. No distress.   159# - says 3# weight losss since last OV in Nov 2012 Filed Weights   09/19/12 1434  Weight: 158 lb 6.4 oz (71.85 kg)    No murmur heard. Pulmonary/Chest: Effort normal. No respiratory distress. She has no wheezes. She has rales. She exhibits no tenderness.       Rales anteriorly - this is NEW 08/11/2012  None posteriorly   Discussion only visit      Assessment & Plan:

## 2012-09-20 DIAGNOSIS — E8881 Metabolic syndrome: Secondary | ICD-10-CM | POA: Insufficient documentation

## 2012-09-20 NOTE — Assessment & Plan Note (Signed)
She has truncal obesity, fatty liver and insulin resistance. I will place her on low glycemic diet post VATS bx

## 2012-09-20 NOTE — Assessment & Plan Note (Addendum)
2003 - Clear CT per report in 2003 2010 November: Onset of ILD NSIP pattern on CT Chest. Onset of GERD symptoms 2012 November : Progressive ILD NSIP Pattern on CT chest. Non-diagnostic bronc with negative autoimmune profile. Small hiatal hernia + 2013 Sept: Peristence NSIP pattern CT chest. Continued symptoms. Has new rales anteriorly. Reduction in FVC but unchanged DLCO  I again went over indications for VATS lung bx per ATS guidelines. She does not have classic UIP/IPF pattern on CT. Her autoimmune panel is negative. There is persistence of ILD findings since 2010 with worseneing between 2010 and 2012/2013. Based on this VATS lung bx is indicated. REgarding timing a) she is currently low risk and b)  It appears there is still pre-fibrotic inflmmattroy GGO findings on CT chest and therefore current time is best time to biopsy  Benefit of bx a) diagnostic certainty (> 85% chance); b) t herapeutic options can be very different between different path diagnosis; c) prognosis  DDx of possible bx diagnosis of IPF, CEP, BOOP, NSIP all explained. Explained GERD could cause NSIP.  Explaiend findings are not c/w psitacossis and so no need for doxy  They are aware of risks which are low and have been outlined by Dr Dorris Fetch as well  Based on this they will proceed with VATS lung bx. I will be available for assitance post bx   > 50% of this > 25 min visit spent in face to face counseling (15 min visit converted to 25 min)

## 2012-09-24 NOTE — Pre-Procedure Instructions (Signed)
18 Rhonda [name]   09/24/2012   Your procedure is scheduled on: Wednesday, November 6th   Report to [facility] at  6:30 AM.              (Tentative surgery time is from 8:30-11:03)   Call this number if you have problems the morning of surgery: [phone]   Remember:   Do not eat food or drink any liquids:After Midnight Tuesday.    Take these medicines the morning of surgery with A SIP OF WATER: Metoprolol, prilosec   Do not wear jewelry, make-up or nail polish.  Do not wear lotions, powders, or perfumes. You may NOT wear deodorant.   LADIES ---Do not shave 48 hours prior to surgery.   Do not bring valuables to the hospital.  Contacts, dentures or bridgework may not be worn into surgery.   Leave suitcase in the car. After surgery it may be brought to your room.  For patients admitted to the hospital, checkout time is 11:00 AM the day of discharge.   Patients discharged the day of surgery will not be allowed to drive home.   Name and phone number of your driver:    Special Instructions: Shower using CHG 2 nights before surgery and the night before surgery.  If you shower the day of surgery use CHG.  Use special wash - you have one bottle of CHG for all showers.  You should use approximately 1/3 of the bottle for each shower.   Please read over the following fact sheets that you were given: Pain Booklet, Coughing and Deep Breathing, Blood Transfusion Information, MRSA Information and Surgical Site Infection Prevention

## 2012-09-26 ENCOUNTER — Encounter (HOSPITAL_COMMUNITY)
Admission: RE | Admit: 2012-09-26 | Discharge: 2012-09-26 | Disposition: A | Discharge status: Home / Self Care | Payer: BC Managed Care – PPO | Source: Ambulatory Visit | Attending: Thoracic Surgery (Cardiothoracic Vascular Surgery) | Admitting: Thoracic Surgery (Cardiothoracic Vascular Surgery)

## 2012-09-26 ENCOUNTER — Encounter (HOSPITAL_COMMUNITY): Payer: Self-pay

## 2012-09-26 VITALS — BP 123/75 | HR 75 | Temp 98.0°F | Resp 20 | Ht 60.0 in | Wt 156.5 lb

## 2012-09-26 DIAGNOSIS — J849 Interstitial pulmonary disease, unspecified: Secondary | ICD-10-CM

## 2012-09-26 LAB — URINALYSIS, ROUTINE W REFLEX MICROSCOPIC
Bilirubin Urine: NEGATIVE
Ketones, ur: NEGATIVE mg/dL
Nitrite: NEGATIVE
Specific Gravity, Urine: 1.019 (ref 1.005–1.030)
Urobilinogen, UA: 0.2 mg/dL (ref 0.0–1.0)

## 2012-09-26 LAB — PROTIME-INR: INR: 1.05 (ref 0.00–1.49)

## 2012-09-26 LAB — COMPREHENSIVE METABOLIC PANEL
ALT: 18 U/L (ref 0–35)
AST: 24 U/L (ref 0–37)
Albumin: 3.6 g/dL (ref 3.5–5.2)
Calcium: 10 mg/dL (ref 8.4–10.5)
Creatinine, Ser: 0.9 mg/dL (ref 0.50–1.10)
GFR calc non Af Amer: 67 mL/min — ABNORMAL LOW (ref >90)
Sodium: 139 mEq/L (ref 135–145)
Total Protein: 7.7 g/dL (ref 6.0–8.3)

## 2012-09-26 LAB — SURGICAL PCR SCREEN: Staphylococcus aureus: NEGATIVE

## 2012-09-26 LAB — BLOOD GAS, ARTERIAL
Acid-base deficit: 0.9 mmol/L (ref 0.0–2.0)
Drawn by: 24486
FIO2: 0.21 %
O2 Saturation: 96.9 %
pCO2 arterial: 32.6 mmHg — ABNORMAL LOW (ref 35.0–45.0)
pO2, Arterial: 77.1 mmHg — ABNORMAL LOW (ref 80.0–100.0)

## 2012-09-26 LAB — CBC
MCH: 29.2 pg (ref 26.0–34.0)
MCHC: 33.1 g/dL (ref 30.0–36.0)
MCV: 88.3 fL (ref 78.0–100.0)
Platelets: 218 10*3/uL (ref 150–400)
RBC: 5.06 MIL/uL (ref 3.87–5.11)
RDW: 13.6 % (ref 11.5–15.5)

## 2012-09-26 LAB — APTT: aPTT: 28 seconds (ref 24–37)

## 2012-09-26 NOTE — Progress Notes (Addendum)
PT HAS 3 REDDEN AND RAISED AREA ON RIGHT SIDE.Marland KitchenHAVE CALLED  DR. HENDRICKSON TO HAVE HIM LOOK AT THE SITE...Marland KitchenMarland KitchenHE SAID HE WOULD COME TO LOOK AT AREA...DA HAVE ALSO REQUESTED INFO  DR Vern Claude OFFICE (PT STATED SHE SAW HIM A YR AGO)....DA  12:05  Dr Dorris Fetch has seen patient...surgery to proceed....da  [place] states no sleep study done....Marland Kitchenand her last cardio visit was with Tereso Newcomer, PA...and that note is in EPIC.Marland KitchenDA

## 2012-09-27 MED ORDER — DEXTROSE 5 % IV SOLN
1.5000 g | INTRAVENOUS | Status: AC
  Administered 2012-09-28: 1.5 g via INTRAVENOUS
  Filled 2012-09-27: qty 1.5

## 2012-09-27 NOTE — Consult Note (Signed)
Anesthesia chart review: Patient is a 61 year old female scheduled for right VATS, lung biopsy for interstitial lung disease on 09/28/2012 by Dr. Dorris Fetch. History includes nonsmoker, obesity, hypercholesterolemia, cataracts, GERD, depression, asthma, palpitations.  Pulmonologist is Dr. Marchelle Gearing.  Cardiologist is Dr. Daleen Squibb (last visit with Tereso Newcomer, PA-C on 12/16/11).  EKG on 09/26/12 showed NSR, inferior T wave abnormality, consider ischemia.  Non-specific ST/T wave changes in V3-5.  Overall, I think her EKG is stable when compared to 12/16/11.  (I do not have her EKG from 04/02/10, but according to Dr. Vern Claude office note she showed new "ST depression with biphasic T waves in 23 aVF V3 through V5" which would also correlate with her 09/26/12 EKG findings.  He subsequently ordered a stress test (see below) that was normal.  Nuclear stress test on 04/24/10 (see Notes tab) was normal, EF 76%.     By notes, she had an echo in 2003 that showed mild MR.  Chest x-ray report on 09/26/2012 showed stable bilateral peribronchial changes. No acute abnormality is noted.  Labs noted.  Her EKG appears stable.  If no significant change in her status then anticipate she can proceed as planned. She will be evaluated by her assigned anesthesiologist on the day of surgery.  Shonna Chock, PA-C

## 2012-09-28 ENCOUNTER — Encounter (HOSPITAL_COMMUNITY)
Admission: RE | Disposition: A | Discharge status: Home / Self Care | Payer: Self-pay | Source: Ambulatory Visit | Attending: Thoracic Surgery (Cardiothoracic Vascular Surgery)

## 2012-09-28 ENCOUNTER — Encounter (HOSPITAL_COMMUNITY): Payer: Self-pay | Admitting: Vascular Surgery

## 2012-09-28 ENCOUNTER — Encounter (HOSPITAL_COMMUNITY): Payer: Self-pay | Admitting: *Deleted

## 2012-09-28 ENCOUNTER — Inpatient Hospital Stay (HOSPITAL_COMMUNITY): Payer: BC Managed Care – PPO

## 2012-09-28 ENCOUNTER — Ambulatory Visit (HOSPITAL_COMMUNITY): Payer: BC Managed Care – PPO | Admitting: Vascular Surgery

## 2012-09-28 ENCOUNTER — Inpatient Hospital Stay (HOSPITAL_COMMUNITY)
Admission: RE | Admit: 2012-09-28 | Discharge: 2012-09-30 | DRG: 075 | Disposition: A | Discharge status: Home / Self Care | Payer: BC Managed Care – PPO | Source: Ambulatory Visit | Attending: Thoracic Surgery (Cardiothoracic Vascular Surgery) | Admitting: Thoracic Surgery (Cardiothoracic Vascular Surgery)

## 2012-09-28 DIAGNOSIS — E7800 Pure hypercholesterolemia, unspecified: Secondary | ICD-10-CM | POA: Diagnosis present

## 2012-09-28 DIAGNOSIS — Z01812 Encounter for preprocedural laboratory examination: Secondary | ICD-10-CM

## 2012-09-28 DIAGNOSIS — Z01818 Encounter for other preprocedural examination: Secondary | ICD-10-CM

## 2012-09-28 DIAGNOSIS — J849 Interstitial pulmonary disease, unspecified: Secondary | ICD-10-CM

## 2012-09-28 DIAGNOSIS — H269 Unspecified cataract: Secondary | ICD-10-CM | POA: Diagnosis present

## 2012-09-28 DIAGNOSIS — F329 Major depressive disorder, single episode, unspecified: Secondary | ICD-10-CM | POA: Diagnosis present

## 2012-09-28 DIAGNOSIS — H9190 Unspecified hearing loss, unspecified ear: Secondary | ICD-10-CM | POA: Diagnosis present

## 2012-09-28 DIAGNOSIS — F3289 Other specified depressive episodes: Secondary | ICD-10-CM | POA: Diagnosis present

## 2012-09-28 DIAGNOSIS — J8410 Pulmonary fibrosis, unspecified: Secondary | ICD-10-CM

## 2012-09-28 DIAGNOSIS — K219 Gastro-esophageal reflux disease without esophagitis: Secondary | ICD-10-CM | POA: Diagnosis present

## 2012-09-28 DIAGNOSIS — J45909 Unspecified asthma, uncomplicated: Secondary | ICD-10-CM | POA: Diagnosis present

## 2012-09-28 HISTORY — PX: LUNG BIOPSY: SHX5088

## 2012-09-28 HISTORY — PX: VIDEO ASSISTED THORACOSCOPY: SHX5073

## 2012-09-28 SURGERY — VIDEO ASSISTED THORACOSCOPY
Anesthesia: General | Site: Chest | Laterality: Right | Wound class: Clean Contaminated

## 2012-09-28 MED ORDER — PROMETHAZINE HCL 25 MG/ML IJ SOLN: 6.2500 mg | INTRAMUSCULAR | Status: DC | PRN

## 2012-09-28 MED ORDER — SENNOSIDES-DOCUSATE SODIUM 8.6-50 MG PO TABS
1.0000 | ORAL_TABLET | Freq: Every evening | ORAL | Status: DC | PRN
  Filled 2012-09-28: qty 1

## 2012-09-28 MED ORDER — ACETAMINOPHEN 10 MG/ML IV SOLN
1000.0000 mg | Freq: Four times a day (QID) | INTRAVENOUS | Status: AC
  Administered 2012-09-28 – 2012-09-29 (×4): 1000 mg via INTRAVENOUS
  Filled 2012-09-28 (×4): qty 100

## 2012-09-28 MED ORDER — FENTANYL 10 MCG/ML IV SOLN
INTRAVENOUS | Status: DC
  Administered 2012-09-28: 13:00:00 via INTRAVENOUS
  Administered 2012-09-28 (×2): 30 ug via INTRAVENOUS
  Administered 2012-09-28: 20 ug via INTRAVENOUS
  Administered 2012-09-29: 40 ug via INTRAVENOUS
  Administered 2012-09-29: 100 ug via INTRAVENOUS
  Administered 2012-09-29: 23:00:00 via INTRAVENOUS
  Administered 2012-09-29: 100 ug via INTRAVENOUS
  Administered 2012-09-30: 30 ug via INTRAVENOUS
  Filled 2012-09-28 (×2): qty 50

## 2012-09-28 MED ORDER — KETOROLAC TROMETHAMINE 30 MG/ML IJ SOLN
30.0000 mg | Freq: Four times a day (QID) | INTRAMUSCULAR | Status: AC
  Administered 2012-09-28 – 2012-09-29 (×5): 30 mg via INTRAVENOUS
  Filled 2012-09-28 (×10): qty 1

## 2012-09-28 MED ORDER — BISACODYL 5 MG PO TBEC
10.0000 mg | DELAYED_RELEASE_TABLET | Freq: Every day | ORAL | Status: DC
  Administered 2012-09-29 – 2012-09-30 (×2): 10 mg via ORAL
  Filled 2012-09-28 (×3): qty 2

## 2012-09-28 MED ORDER — CALCIUM CARBONATE ANTACID 500 MG PO CHEW
2.0000 | CHEWABLE_TABLET | Freq: Every day | ORAL | Status: DC
  Administered 2012-09-29 – 2012-09-30 (×2): 400 mg via ORAL
  Filled 2012-09-28 (×3): qty 2

## 2012-09-28 MED ORDER — GLYCOPYRROLATE 0.2 MG/ML IJ SOLN
INTRAMUSCULAR | Status: DC | PRN
  Administered 2012-09-28: .6 mg via INTRAVENOUS

## 2012-09-28 MED ORDER — ALBUTEROL SULFATE HFA 108 (90 BASE) MCG/ACT IN AERS
INHALATION_SPRAY | RESPIRATORY_TRACT | Status: DC | PRN
  Administered 2012-09-28 (×3): 2 via RESPIRATORY_TRACT

## 2012-09-28 MED ORDER — NEOSTIGMINE METHYLSULFATE 1 MG/ML IJ SOLN
INTRAMUSCULAR | Status: DC | PRN
  Administered 2012-09-28: 4 mg via INTRAVENOUS

## 2012-09-28 MED ORDER — MIDAZOLAM HCL 5 MG/5ML IJ SOLN
INTRAMUSCULAR | Status: DC | PRN
  Administered 2012-09-28: 2 mg via INTRAVENOUS

## 2012-09-28 MED ORDER — SIMETHICONE 40 MG/0.6ML PO SUSP
80.0000 mg | Freq: Three times a day (TID) | ORAL | Status: DC | PRN
  Administered 2012-09-30: 80 mg via ORAL
  Filled 2012-09-28 (×2): qty 1.2

## 2012-09-28 MED ORDER — NALOXONE HCL 0.4 MG/ML IJ SOLN: 0.4000 mg | INTRAMUSCULAR | Status: DC | PRN

## 2012-09-28 MED ORDER — LACTATED RINGERS IV SOLN
INTRAVENOUS | Status: DC | PRN
  Administered 2012-09-28: 08:00:00 via INTRAVENOUS

## 2012-09-28 MED ORDER — 0.9 % SODIUM CHLORIDE (POUR BTL) OPTIME
TOPICAL | Status: DC | PRN
  Administered 2012-09-28: 2000 mL

## 2012-09-28 MED ORDER — EPHEDRINE SULFATE 50 MG/ML IJ SOLN
INTRAMUSCULAR | Status: DC | PRN
  Administered 2012-09-28: 5 mg via INTRAVENOUS
  Administered 2012-09-28: 10 mg via INTRAVENOUS
  Administered 2012-09-28: 5 mg via INTRAVENOUS

## 2012-09-28 MED ORDER — FENTANYL CITRATE 0.05 MG/ML IJ SOLN
INTRAMUSCULAR | Status: DC | PRN
  Administered 2012-09-28 (×2): 50 ug via INTRAVENOUS
  Administered 2012-09-28: 100 ug via INTRAVENOUS
  Administered 2012-09-28: 50 ug via INTRAVENOUS

## 2012-09-28 MED ORDER — LACTATED RINGERS IV SOLN
INTRAVENOUS | Status: DC | PRN
  Administered 2012-09-28: 09:00:00 via INTRAVENOUS

## 2012-09-28 MED ORDER — CALCIUM CARBONATE ANTACID 500 MG PO CHEW
400.0000 mg | CHEWABLE_TABLET | Freq: Once | ORAL | Status: AC
  Administered 2012-09-28: 400 mg via ORAL
  Filled 2012-09-28: qty 2

## 2012-09-28 MED ORDER — SODIUM CHLORIDE 0.9 % IJ SOLN: 9.0000 mL | INTRAMUSCULAR | Status: DC | PRN

## 2012-09-28 MED ORDER — PANTOPRAZOLE SODIUM 40 MG PO TBEC
40.0000 mg | DELAYED_RELEASE_TABLET | Freq: Every day | ORAL | Status: DC
  Administered 2012-09-29 – 2012-09-30 (×2): 40 mg via ORAL
  Filled 2012-09-28 (×2): qty 1

## 2012-09-28 MED ORDER — HYDROMORPHONE HCL PF 1 MG/ML IJ SOLN
INTRAMUSCULAR | Status: AC
  Administered 2012-09-28: 0.5 mg via INTRAVENOUS
  Filled 2012-09-28: qty 1

## 2012-09-28 MED ORDER — ONDANSETRON HCL 4 MG/2ML IJ SOLN: 4.0000 mg | Freq: Four times a day (QID) | INTRAMUSCULAR | Status: DC | PRN

## 2012-09-28 MED ORDER — HYDROMORPHONE HCL PF 1 MG/ML IJ SOLN
INTRAMUSCULAR | Status: DC | PRN
  Administered 2012-09-28 (×2): 0.5 mg via INTRAVENOUS

## 2012-09-28 MED ORDER — DEXTROSE-NACL 5-0.9 % IV SOLN
INTRAVENOUS | Status: DC
Start: 2012-09-28 — End: 2012-09-30
  Administered 2012-09-29: 04:00:00 via INTRAVENOUS

## 2012-09-28 MED ORDER — LEVOCETIRIZINE DIHYDROCHLORIDE 5 MG PO TABS: 5.0000 mg | ORAL_TABLET | Freq: Every day | ORAL | Status: DC

## 2012-09-28 MED ORDER — PROPOFOL 10 MG/ML IV BOLUS
INTRAVENOUS | Status: DC | PRN
  Administered 2012-09-28: 170 mg via INTRAVENOUS

## 2012-09-28 MED ORDER — TRAMADOL HCL 50 MG PO TABS
50.0000 mg | ORAL_TABLET | Freq: Four times a day (QID) | ORAL | Status: DC | PRN
  Administered 2012-09-29 – 2012-09-30 (×3): 100 mg via ORAL
  Filled 2012-09-28 (×3): qty 2

## 2012-09-28 MED ORDER — VECURONIUM BROMIDE 10 MG IV SOLR
INTRAVENOUS | Status: DC | PRN
  Administered 2012-09-28: 7 mg via INTRAVENOUS

## 2012-09-28 MED ORDER — METOPROLOL SUCCINATE ER 25 MG PO TB24
25.0000 mg | ORAL_TABLET | Freq: Every day | ORAL | Status: DC
  Administered 2012-09-28 – 2012-09-30 (×3): 25 mg via ORAL
  Filled 2012-09-28 (×3): qty 1

## 2012-09-28 MED ORDER — LORATADINE 10 MG PO TABS
10.0000 mg | ORAL_TABLET | Freq: Every day | ORAL | Status: DC
  Administered 2012-09-29 – 2012-09-30 (×2): 10 mg via ORAL
  Filled 2012-09-28 (×3): qty 1

## 2012-09-28 MED ORDER — FLUOXETINE HCL 20 MG PO TABS
20.0000 mg | ORAL_TABLET | ORAL | Status: DC
  Administered 2012-09-30: 20 mg via ORAL
  Filled 2012-09-28: qty 1

## 2012-09-28 MED ORDER — DIPHENHYDRAMINE HCL 50 MG/ML IJ SOLN: 12.5000 mg | Freq: Four times a day (QID) | INTRAMUSCULAR | Status: DC | PRN

## 2012-09-28 MED ORDER — LIDOCAINE HCL (CARDIAC) 20 MG/ML IV SOLN
INTRAVENOUS | Status: DC | PRN
  Administered 2012-09-28: 50 mg via INTRAVENOUS

## 2012-09-28 MED ORDER — ONDANSETRON HCL 4 MG/2ML IJ SOLN
4.0000 mg | Freq: Four times a day (QID) | INTRAMUSCULAR | Status: DC | PRN
  Administered 2012-09-30: 4 mg via INTRAVENOUS
  Filled 2012-09-28: qty 2

## 2012-09-28 MED ORDER — CALCIUM CARBONATE ANTACID 750 MG PO CHEW: 2.0000 | CHEWABLE_TABLET | Freq: Every day | ORAL | Status: DC

## 2012-09-28 MED ORDER — ONDANSETRON HCL 4 MG/2ML IJ SOLN
INTRAMUSCULAR | Status: DC | PRN
  Administered 2012-09-28: 4 mg via INTRAVENOUS

## 2012-09-28 MED ORDER — POTASSIUM CHLORIDE 10 MEQ/50ML IV SOLN: 10.0000 meq | Freq: Every day | INTRAVENOUS | Status: DC | PRN

## 2012-09-28 MED ORDER — HYDROMORPHONE HCL PF 1 MG/ML IJ SOLN
0.2500 mg | INTRAMUSCULAR | Status: DC | PRN
  Administered 2012-09-28 (×4): 0.5 mg via INTRAVENOUS

## 2012-09-28 MED ORDER — SIMVASTATIN 20 MG PO TABS
20.0000 mg | ORAL_TABLET | Freq: Every day | ORAL | Status: DC
  Administered 2012-09-29: 20 mg via ORAL
  Filled 2012-09-28 (×3): qty 1

## 2012-09-28 MED ORDER — DIPHENHYDRAMINE HCL 12.5 MG/5ML PO ELIX
12.5000 mg | ORAL_SOLUTION | Freq: Four times a day (QID) | ORAL | Status: DC | PRN
  Filled 2012-09-28: qty 5

## 2012-09-28 MED ORDER — DEXTROSE 5 % IV SOLN
1.5000 g | Freq: Two times a day (BID) | INTRAVENOUS | Status: AC
  Administered 2012-09-28 – 2012-09-29 (×2): 1.5 g via INTRAVENOUS
  Filled 2012-09-28 (×2): qty 1.5

## 2012-09-28 SURGICAL SUPPLY — 71 items
APPLIER CLIP ROT 10 11.4 M/L (STAPLE) ×3
APR CLP MED LRG 11.4X10 (STAPLE) ×2
BAG SPEC RTRVL LRG 6X4 10 (ENDOMECHANICALS)
CANISTER SUCTION 2500CC (MISCELLANEOUS) ×3 IMPLANT
CATH KIT ON Q 5IN SLV (PAIN MANAGEMENT) IMPLANT
CATH THORACIC 28FR (CATHETERS) ×2 IMPLANT
CATH THORACIC 28FR RT ANG (CATHETERS) ×3 IMPLANT
CATH THORACIC 36FR (CATHETERS) IMPLANT
CATH THORACIC 36FR RT ANG (CATHETERS) IMPLANT
CLIP APPLIE ROT 10 11.4 M/L (STAPLE) ×1 IMPLANT
CLIP TI MEDIUM 6 (CLIP) ×3 IMPLANT
CLOTH BEACON ORANGE TIMEOUT ST (SAFETY) ×3 IMPLANT
CONN Y 3/8X3/8X3/8  BEN (MISCELLANEOUS)
CONN Y 3/8X3/8X3/8 BEN (MISCELLANEOUS) ×2 IMPLANT
CONT SPEC 4OZ CLIKSEAL STRL BL (MISCELLANEOUS) ×8 IMPLANT
DRAPE INCISE 23X17 IOBAN STRL (DRAPES) ×1
DRAPE INCISE 23X17 STRL (DRAPES) IMPLANT
DRAPE INCISE IOBAN 23X17 STRL (DRAPES) ×2 IMPLANT
DRAPE LAPAROSCOPIC ABDOMINAL (DRAPES) ×3 IMPLANT
DRAPE WARM FLUID 44X44 (DRAPE) ×6 IMPLANT
ELECT REM PT RETURN 9FT ADLT (ELECTROSURGICAL) ×3
ELECTRODE REM PT RTRN 9FT ADLT (ELECTROSURGICAL) ×2 IMPLANT
GLOVE EUDERMIC 7 POWDERFREE (GLOVE) ×6 IMPLANT
GOWN PREVENTION PLUS XLARGE (GOWN DISPOSABLE) ×3 IMPLANT
GOWN STRL NON-REIN LRG LVL3 (GOWN DISPOSABLE) ×6 IMPLANT
HANDLE STAPLE ENDO GIA SHORT (STAPLE) ×1
HEMOSTAT SURGICEL 2X14 (HEMOSTASIS) IMPLANT
KIT BASIN OR (CUSTOM PROCEDURE TRAY) ×3 IMPLANT
KIT ROOM TURNOVER OR (KITS) ×3 IMPLANT
KIT SUCTION CATH 14FR (SUCTIONS) ×3 IMPLANT
NS IRRIG 1000ML POUR BTL (IV SOLUTION) ×6 IMPLANT
PACK CHEST (CUSTOM PROCEDURE TRAY) ×3 IMPLANT
PAD ARMBOARD 7.5X6 YLW CONV (MISCELLANEOUS) ×6 IMPLANT
POUCH ENDO CATCH II 15MM (MISCELLANEOUS) IMPLANT
POUCH SPECIMEN RETRIEVAL 10MM (ENDOMECHANICALS) IMPLANT
RELOAD EGIA 45 MED/THCK PURPLE (STAPLE) ×18 IMPLANT
SEALANT PROGEL (MISCELLANEOUS) IMPLANT
SEALANT SURG COSEAL 4ML (VASCULAR PRODUCTS) IMPLANT
SEALANT SURG COSEAL 8ML (VASCULAR PRODUCTS) IMPLANT
SOLUTION ANTI FOG 6CC (MISCELLANEOUS) ×5 IMPLANT
SPECIMEN JAR MEDIUM (MISCELLANEOUS) ×3 IMPLANT
SPONGE GAUZE 4X4 12PLY (GAUZE/BANDAGES/DRESSINGS) ×3 IMPLANT
SPONGE INTESTINAL PEANUT (DISPOSABLE) IMPLANT
STAPLER ENDO GIA 12 SHRT THIN (STAPLE) IMPLANT
STAPLER ENDO GIA 12MM SHORT (STAPLE) ×2 IMPLANT
SUT PROLENE 4 0 RB 1 (SUTURE)
SUT PROLENE 4-0 RB1 .5 CRCL 36 (SUTURE) IMPLANT
SUT SILK  1 MH (SUTURE) ×2
SUT SILK 1 MH (SUTURE) ×4 IMPLANT
SUT SILK 2 0SH CR/8 30 (SUTURE) IMPLANT
SUT SILK 3 0SH CR/8 30 (SUTURE) IMPLANT
SUT VIC AB 1 CTX 36 (SUTURE) ×3
SUT VIC AB 1 CTX36XBRD ANBCTR (SUTURE) ×1 IMPLANT
SUT VIC AB 2-0 CT1 27 (SUTURE) ×3
SUT VIC AB 2-0 CT1 TAPERPNT 27 (SUTURE) IMPLANT
SUT VIC AB 2-0 CTX 36 (SUTURE) IMPLANT
SUT VIC AB 2-0 UR6 27 (SUTURE) IMPLANT
SUT VIC AB 3-0 MH 27 (SUTURE) IMPLANT
SUT VIC AB 3-0 X1 27 (SUTURE) ×5 IMPLANT
SUT VICRYL 2 TP 1 (SUTURE) IMPLANT
SWAB COLLECTION DEVICE MRSA (MISCELLANEOUS) IMPLANT
SYSTEM SAHARA CHEST DRAIN ATS (WOUND CARE) ×3 IMPLANT
TAPE CLOTH 4X10 WHT NS (GAUZE/BANDAGES/DRESSINGS) ×3 IMPLANT
TAPE CLOTH SURG 4X10 WHT LF (GAUZE/BANDAGES/DRESSINGS) ×1 IMPLANT
TIP APPLICATOR SPRAY EXTEND 16 (VASCULAR PRODUCTS) IMPLANT
TOWEL OR 17X24 6PK STRL BLUE (TOWEL DISPOSABLE) ×3 IMPLANT
TOWEL OR 17X26 10 PK STRL BLUE (TOWEL DISPOSABLE) ×3 IMPLANT
TRAP SPECIMEN MUCOUS 40CC (MISCELLANEOUS) IMPLANT
TRAY FOLEY CATH 14FRSI W/METER (CATHETERS) ×3 IMPLANT
TUBE ANAEROBIC SPECIMEN COL (MISCELLANEOUS) IMPLANT
WATER STERILE IRR 1000ML POUR (IV SOLUTION) ×4 IMPLANT

## 2012-09-28 NOTE — Progress Notes (Signed)
Utilization review completed.  

## 2012-09-28 NOTE — Transfer of Care (Signed)
Immediate Anesthesia Transfer of Care Note  Patient: Rhonda [name]  Procedure(s) Performed: Procedure(s) (LRB) with comments: VIDEO ASSISTED THORACOSCOPY (Right) LUNG BIOPSY (N/A) - lung biopsies tims three  Patient Location: PACU  Anesthesia Type:General  Level of Consciousness: awake, alert  and oriented  Airway & Oxygen Therapy: Patient Spontanous Breathing and Patient connected to nasal cannula oxygen  Post-op Assessment: Report given to PACU RN, Post -op Vital signs reviewed and stable and Patient moving all extremities  Post vital signs: Reviewed and stable  Complications: No apparent anesthesia complications

## 2012-09-28 NOTE — Anesthesia Procedure Notes (Signed)
Procedure Name: Intubation Date/Time: 09/28/2012 8:42 AM Performed by: Luster Landsberg Pre-anesthesia Checklist: Patient identified, Suction available and Emergency Drugs available Patient Re-evaluated:Patient Re-evaluated prior to inductionOxygen Delivery Method: Circle system utilized Preoxygenation: Pre-oxygenation with 100% oxygen Intubation Type: IV induction Ventilation: Mask ventilation without difficulty and Oral airway inserted - appropriate to patient size Laryngoscope Size: Mac and 3 Grade View: Grade III Tube type: Oral Endobronchial tube: Double lumen EBT, EBT position confirmed by auscultation, EBT position confirmed by fiberoptic bronchoscope and Left and 37 Fr Number of attempts: 2 Airway Equipment and Method: Video-laryngoscopy and Stylet Placement Confirmation: ETT inserted through vocal cords under direct vision,  positive ETCO2 and breath sounds checked- equal and bilateral Tube secured with: Tape Dental Injury: Teeth and Oropharynx as per pre-operative assessment  Difficulty Due To: Difficulty was anticipated Comments: DVL x1 with MAC 3 - no view of cords.  DVL with glidescope - Grade 2 view.  Small mouth opening.  DL ETT advanced thru cords.  Placement verified with fiberoptic

## 2012-09-28 NOTE — Anesthesia Preprocedure Evaluation (Signed)
Anesthesia Evaluation  Patient identified by MRN, date of birth, ID band Patient awake    Reviewed: Allergy & Precautions, H&P , NPO status   History of Anesthesia Complications Negative for: history of anesthetic complications  Airway Mallampati: II TM Distance: >3 FB Neck ROM: Full    Dental  (+) Teeth Intact and Dental Advisory Given   Pulmonary shortness of breath and with exertion, asthma , pneumonia -, resolved,    Pulmonary exam normal       Cardiovascular negative cardio ROS      Neuro/Psych PSYCHIATRIC DISORDERS Depression negative neurological ROS     GI/Hepatic Neg liver ROS, GERD-  Medicated,  Endo/Other  negative endocrine ROS  Renal/GU negative Renal ROS     Musculoskeletal   Abdominal   Peds  Hematology   Anesthesia Other Findings   Reproductive/Obstetrics                           Anesthesia Physical Anesthesia Plan  ASA: III  Anesthesia Plan: General   Post-op Pain Management:    Induction: Intravenous  Airway Management Planned: Double Lumen EBT  Additional Equipment: Arterial line and CVP  Intra-op Plan:   Post-operative Plan: Extubation in OR  Informed Consent: I have reviewed the patients History and Physical, chart, labs and discussed the procedure including the risks, benefits and alternatives for the proposed anesthesia with the patient or authorized representative who has indicated his/her understanding and acceptance.   Dental advisory given  Plan Discussed with: CRNA, Anesthesiologist and Surgeon  Anesthesia Plan Comments:         Anesthesia Quick Evaluation

## 2012-09-28 NOTE — Brief Op Note (Addendum)
09/28/2012  9:48 AM  PATIENT:  Rhonda [name]  61 y.o. female  PRE-OPERATIVE DIAGNOSIS:  Interstitial lung disease  POST-OPERATIVE DIAGNOSIS:    Interstitial lung disease  PROCEDURE:  Right VIDEO ASSISTED THORACOSCOPY,  LUNG BIOPSIES x 3  SURGEON:  Surgeon(s) and Role:    * Loreli Slot, MD - Primary  PHYSICIAN ASSISTANT: Doree Fudge PA-C   ANESTHESIA:   general  EBL:   minimal  BLOOD ADMINISTERED:none  DRAINS: 28 French straight Chest Tube(s) in the right pleural space    SPECIMEN:  Source of Specimen:  RUL,RML, and RLL lung biopsies;AFB, fungus, and cultures sent as well  DISPOSITION OF SPECIMEN:  PATHOLOGY  COUNTS CORRECT:  YES  DICTATION: .Dragon Dictation  PLAN OF CARE: Admit to inpatient   PATIENT DISPOSITION:  PACU - hemodynamically stable.   Delay start of Pharmacological VTE agent (>24hrs) due to surgical blood loss or risk of bleeding: yes

## 2012-09-28 NOTE — Interval H&P Note (Signed)
History and Physical Interval Note:  09/28/2012 8:07 AM  Rhonda [name]  has presented today for surgery, with the diagnosis of interstitial lung disease  The various methods of treatment have been discussed with the patient and family. After consideration of risks, benefits and other options for treatment, the patient has consented to  Procedure(s) (LRB) with comments: VIDEO ASSISTED THORACOSCOPY (Right) LUNG BIOPSY (N/A) as a surgical intervention .  The patient's history has been reviewed, patient examined, no change in status, stable for surgery.  I have reviewed the patient's chart and labs.  Questions were answered to the patient's satisfaction.     [name]

## 2012-09-28 NOTE — Preoperative (Signed)
Beta Blockers   Reason not to administer Beta Blockers:Not Applicable 

## 2012-09-28 NOTE — Progress Notes (Signed)
Pt arrived from PACU, report given and oriented to unit and routine. Family at bedside, call bell within reach. VSS.

## 2012-09-28 NOTE — H&P (View-Only) (Signed)
PCP is RICHTER,KAREN L., MD Referring Provider is Ramaswamy, Murali, MD  Chief Complaint  Patient presents with  . Interstitial Lung Disease    Referral from Dr Ramaswamy for surgical eval on ILD    HPI: 61-year-old woman presents with chief complaint of shortness of breath.  Rhonda [name] is a 61-year-old woman who is followed by Dr. Ramaswamy for interstitial lung disease. 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 She has a history of asthma dating back 3-4 years. However, she dates her current problems back to about a year ago when she had pneumonia. She was treated with antibiotics and steroids. Since that time she continued to have shortness of breath and decreased energy. She says her shortness of breath is not consistent. It will vary from day to day. Sometimes she will have it with relatively mild activities another time she's not had any with much heavier activities.  It was recommended to her that she have a lung biopsy a year ago. However she did not want to have it done.  She recently saw Dr. Ramaswamy. He noted that even though her symptoms were subjectively similar and DLCO on PFT was unchanged compared to Nov 2012, there was enough evidence to suggest she has progressive ILD. She had new rales on exam anteriorly. Her FVC and TLC are decreased, even though still within normal limits, and she had new desaturation with sub-maximal exercise. He therefore thought it was important for her to proceed with a lung biopsy, and she is now agreeable to consider it.    Past Medical History  Diagnosis Date  . SOB (shortness of breath)   . High cholesterol   . History of chronic bronchitis     as child  . GERD (gastroesophageal reflux disease)   . Arthritis   . Depression     Past Surgical History  Procedure Date  . Tubal ligation   . Foot surgery   . Video bronchoscopy 11/19/2011    Procedure: VIDEO BRONCHOSCOPY WITH FLUORO;  Surgeon: Murali Ramaswamy, MD;   Location: [facility];  Service: Endoscopy;;    Family History  Problem Relation Age of Onset  . Emphysema Maternal Grandmother   . Asthma Mother   . Rheum arthritis Mother   . Rheum arthritis Father   . Lymphoma Father   . Cancer Maternal Grandfather     Social History History  Substance Use Topics  . Smoking status: Never Smoker   . Smokeless tobacco: Never Used   Comment: pt states she experimented in college  . Alcohol Use: 0.5 oz/week    1 drink(s) per week     once a wk.    Current Outpatient Prescriptions  Medication Sig Dispense Refill  . acetaminophen (TYLENOL) 500 MG tablet Take 500 mg by mouth every 6 (six) hours as needed.        . albuterol (PROVENTIL) (2.5 MG/3ML) 0.083% nebulizer solution Take 2.5 mg by nebulization every 4 (four) hours as needed.        . benzonatate (TESSALON) 100 MG capsule Take by mouth as needed.        . calcium carbonate (TUMS) 500 MG chewable tablet Chew 1 tablet by mouth daily.        . FLUoxetine (PROZAC) 20 MG tablet as needed. 1 po daily      . fluticasone (FLONASE) 50 MCG/ACT nasal spray Place 2 sprays into the nose 2 (two) times daily.  3 g  2  . guaiFENesin (MUCINEX) 600   MG 12 hr tablet Take 1,200 mg by mouth as needed.       . levocetirizine (XYZAL) 5 MG tablet Take 1 tablet (5 mg total) by mouth daily.  90 tablet  2  . metoprolol succinate (TOPROL-XL) 50 MG 24 hr tablet Take 1 tablet (50 mg total) by mouth daily. Take with or immediately following a meal.  90 tablet  2  . mometasone (ASMANEX) 220 MCG/INH inhaler Inhale 1 puff into the lungs daily.  3 Inhaler  2  . montelukast (SINGULAIR) 10 MG tablet TAKE 1 TABLET EVERY DAY  30 tablet  5  . omeprazole (PRILOSEC) 20 MG capsule Take 1 capsule (20 mg total) by mouth daily.  90 capsule  2  . pravastatin (PRAVACHOL) 40 MG tablet Take 1 tablet (40 mg total) by mouth daily.  90 tablet  2  . DISCONTD: montelukast (SINGULAIR) 10 MG tablet Take 1 tablet (10 mg total) by mouth daily.  90  tablet  2    Allergies  Allergen Reactions  . Atorvastatin   . Codeine     REACTION: nausea  . Povidone   . Rosuvastatin   . Sulfonamide Derivatives     Review of Systems  Constitutional: Positive for fatigue (decreased energy).  HENT: Positive for hearing loss (mild).   Eyes:       Catarracts  Respiratory: Positive for shortness of breath (inconsistent).   Gastrointestinal:       Reflux  Genitourinary:       C-section  Musculoskeletal: Positive for arthralgias (fingers, hands).  All other systems reviewed and are negative.    BP 137/77  Pulse 66  Resp 18  Ht 5' 1" (1.549 m)  Wt 158 lb (71.668 kg)  BMI 29.85 kg/m2  SpO2 95% Physical Exam  Constitutional: She is oriented to person, place, and time. She appears well-developed and well-nourished. No distress.  HENT:  Head: Normocephalic and atraumatic.  Eyes: EOM are normal. Pupils are equal, round, and reactive to light.  Neck: Neck supple. No thyromegaly present.  Cardiovascular: Normal rate, regular rhythm, normal heart sounds and intact distal pulses.  Exam reveals no gallop and no friction rub.   No murmur heard. Pulmonary/Chest: Effort normal. She has rales (both bases).  Abdominal: Soft. There is no tenderness.  Musculoskeletal: She exhibits no edema.  Lymphadenopathy:    She has no cervical adenopathy.  Neurological: She is alert and oriented to person, place, and time. No cranial nerve deficit.  Skin: Skin is warm and dry.  Psychiatric: She has a normal mood and affect.     Diagnostic Tests: CT chest 08/16/12 RADIOLOGY REPORT*  Clinical Data: Clinically progressive interstitial lung disease.  Worsening hypoxia with exertion.  CT CHEST WITHOUT CONTRAST  Technique: Multidetector CT imaging of the chest was performed  following the standard protocol without IV contrast.Dedicated high  resolution images were also performed.  Comparison: 10/13/2011 and 10/15/2009.  Findings: Lung windows demonstrate no  airspace disease. Similar  pattern of interstitial lung disease. Patchy areas of  architectural distortion with minimal traction bronchiectasis and  bronchiolectasis. Minimal subpleural reticulation. No areas of  honeycombing. Scattered areas of mosaic attenuation. Examples  identified within the right upper lobe on image 23 of series 5 and  within the right lower lobe on image 25 of series 5. No cranial  caudal gradient.  Dedicated high resolution images confirm these findings. The areas  of mosaic attenuation are increased with expiration. This suggests  small airways disease causing mosaic perfusion.    Soft tissue windows demonstrate normal heart size without  pericardial or pleural effusion.  Multivessel coronary artery atherosclerosis. Right paratracheal  node which measures 6 mm and is unchanged.  A precarinal node measures 1.1 cm and is unchanged. Hilar regions  poorly evaluated secondary lack of IV contrast.  Small hiatal hernia.  Limited abdominal imaging demonstrates moderate hepatic steatosis.  No acute osseous abnormality. Lower thoracic discogenic sclerosis.  IMPRESSION:  1. Redemonstration of interstitial lung disease. No significant  change. Consolation of findings is nonspecific. Considerations  include nonspecific interstitial pneumonitis, atypical appearance  of usual interstitial pneumonitis, or post infectious fibrosis.  2. No evidence of acute superimposed pulmonary process.  3. Similar mild thoracic adenopathy, likely reactive.  4. Age advanced coronary artery atherosclerosis. Recommend  assessment of coronary risk factors and consideration of medical  therapy.  5. Small hiatal hernia.  6. Hepatic steatosis.  Impression: 61-year-old woman with interstitial lung disease. Workup to date has been nonspecific. His been recommended to her that she have a lung biopsy for diagnostic and prognostic information and to guide therapy.  I discussed in detail with the  patient and her companion the nature of the procedure, the incisions to be used, the need for general anesthesia, the expected hospital stay, and overall recovery. She does understand that this is a diagnostic procedure and not a therapeutic one. I discussed in detail with her the indications, risks, benefits, and alternatives. She understands that the risk include, but are not limited to, death, MI, DVT, PE, bleeding, possible need for transfusion, infection, prolonged air leak, irregular heart rhythms, as well as other unforeseeable complications. She accepts these risks and wishes to proceed with right VATS, lung biopsy. She wishes to wait until early November to have the procedure done.  Plan: Right VATS, lung biopsy for interstitial lung disease on Wednesday, 09/28/2012 

## 2012-09-28 NOTE — Anesthesia Postprocedure Evaluation (Signed)
Anesthesia Post Note  Patient: Rhonda [name]  Procedure(s) Performed: Procedure(s) (LRB): VIDEO ASSISTED THORACOSCOPY (Right) LUNG BIOPSY (N/A)  Anesthesia type: general  Patient location: PACU  Post pain: Pain level controlled  Post assessment: Patient's Cardiovascular Status Stable  Last Vitals:  Filed Vitals:   09/28/12 1219  BP: 130/62  Pulse:   Temp: 36.3 C  Resp:     Post vital signs: Reviewed and stable  Level of consciousness: sedated  Complications: No apparent anesthesia complications

## 2012-09-29 ENCOUNTER — Inpatient Hospital Stay (HOSPITAL_COMMUNITY): Payer: BC Managed Care – PPO

## 2012-09-29 ENCOUNTER — Encounter (HOSPITAL_COMMUNITY): Payer: Self-pay | Admitting: Thoracic Surgery (Cardiothoracic Vascular Surgery)

## 2012-09-29 LAB — CBC
MCV: 87.5 fL (ref 78.0–100.0)
Platelets: 155 10*3/uL (ref 150–400)
RDW: 13.6 % (ref 11.5–15.5)
WBC: 11 10*3/uL — ABNORMAL HIGH (ref 4.0–10.5)

## 2012-09-29 LAB — BLOOD GAS, ARTERIAL
Acid-Base Excess: 1.4 mmol/L (ref 0.0–2.0)
Patient temperature: 98.6
TCO2: 26.8 mmol/L (ref 0–100)
pH, Arterial: 7.41 (ref 7.350–7.450)

## 2012-09-29 LAB — BASIC METABOLIC PANEL
BUN: 9 mg/dL (ref 6–23)
Calcium: 8.8 mg/dL (ref 8.4–10.5)
GFR calc non Af Amer: 87 mL/min — ABNORMAL LOW (ref >90)
Glucose, Bld: 132 mg/dL — ABNORMAL HIGH (ref 70–99)
Sodium: 136 mEq/L (ref 135–145)

## 2012-09-29 MED ORDER — POTASSIUM CHLORIDE CRYS ER 20 MEQ PO TBCR
40.0000 meq | EXTENDED_RELEASE_TABLET | Freq: Once | ORAL | Status: AC
  Administered 2012-09-29: 40 meq via ORAL
  Filled 2012-09-29: qty 2

## 2012-09-29 MED ORDER — TRAMADOL HCL 50 MG PO TABS: 50.0000 mg | ORAL_TABLET | Freq: Four times a day (QID) | ORAL | Status: DC | PRN

## 2012-09-29 MED ORDER — SODIUM CHLORIDE 0.9 % IJ SOLN
INTRAMUSCULAR | Status: AC
  Administered 2012-09-29: 10 mL
  Filled 2012-09-29: qty 10

## 2012-09-29 MED ORDER — SODIUM CHLORIDE 0.9 % IJ SOLN
INTRAMUSCULAR | Status: AC
  Filled 2012-09-29: qty 20

## 2012-09-29 NOTE — Discharge Summary (Signed)
Physician Discharge Summary  Patient ID: Rhonda [name] MRN: [id number] DOB/AGE: 1951-05-14 61 y.o.  Admit date: 09/28/2012 Discharge date: 09/30/2012  Admission Diagnoses: 1. Probable interstitial lung disease 2.History of tobacco abuse 3.History of hypercholesterolemia 4.History of GERD 5.History of depression  Discharge Diagnoses:  1. Probable interstitial lung disease 2.History of tobacco abuse 3.History of hypercholesterolemia 4.History of GERD 5.History of depression  Procedure (s):  Right VIDEO ASSISTED THORACOSCOPY,  LUNG BIOPSIES x 3 by Dr. Dorris Fetch on 09/28/2012  Pathology: Results are currently pending  History of Presenting Illness: This is a 61 year old Caucasian woman who is followed by Dr. Marchelle Gearing for interstitial lung disease. She is a lifelong nonsmoker. She has a history of asthma dating back 3-4 years. However, she dates her current problems back to about a year ago when she had pneumonia. She was treated with antibiotics and steroids. Since that time, she continued to have shortness of breath and decreased energy. She says her shortness of breath is not consistent. It will vary from day to day. Sometimes she will have it with relatively mild activities and other times she's not had any with much heavier activities.  It was recommended to her that she have a lung biopsy a year ago;however, she did not want to have it done.  She recently saw Dr. Marchelle Gearing. He noted that even though her symptoms were subjectively similar and DLCO on PFT was unchanged compared to Nov 2012, there was enough evidence to suggest she has progressive ILD. She had new rales on exam anteriorly. Her FVC and TLC are decreased, even though still within normal limits, and she had new desaturation with sub-maximal exercise. He therefore thought it was important for her to proceed with a lung biopsy, and she is now agreeable to consider it. Dr. Dorris Fetch discussed potential risks, benefits,  and complications and she agreed to proceed. She was admitted to [facility] on 09/28/2012 in order to undergo a right VATS and lung biopsies.  Brief Hospital Course:  She has remained afebrile and hemodynamically stable. Her a line and foley were removed on post operative day one. She has been tolerating a diet. Chest xray done today showed bibasilar atlectasis, some pulmonary vascular congestion, no ptx, and subcutaneous emphysema on right. The chest tube does not have an air leak. It has been placed to water seal. Provided chest x ray remains stable, the chest tube will be removed later today. Provided she remains afebrile, hemodynamically stable, and the morning's chest x ray remains stable, she will be discharged on 09/30/2012.    Latest Vital Signs: Blood pressure 119/48, pulse 64, temperature 98.6 F (37 C), temperature source Oral, resp. rate 18, height 5' (1.524 m), weight 156 lb 8 oz (70.988 kg), SpO2 96.00%.  Physical Exam: Cardiovascular: RRR  Pulmonary: Clear to auscultation bilaterally; no rales, wheezes, or rhonchi.  Abdomen: Soft, non tender, bowel sounds present.  Wounds: Dressing is clean and dry.  Chest Tube: to suction and no air leak   Discharge Condition:Stable  Recent laboratory studies:  Lab Results  Component Value Date   WBC 11.0* 09/29/2012   HGB 12.0 09/29/2012   HCT 36.3 09/29/2012   MCV 87.5 09/29/2012   PLT 155 09/29/2012   Lab Results  Component Value Date   NA 136 09/29/2012   K 3.6 09/29/2012   CL 103 09/29/2012   CO2 26 09/29/2012   CREATININE 0.78 09/29/2012   GLUCOSE 132* 09/29/2012      Diagnostic Studies: Dg Chest Portable 1 View  09/29/2012  *RADIOLOGY REPORT*  Clinical Data: Chest tube placement.  PORTABLE CHEST - 1 VIEW  Comparison: 09/28/2012  Findings: Right subclavian central venous catheter tip projects over the mid SVC.  Right-sided chest tube in place.  There is increased subcutaneous gas. A small amount of pleural gas is suggested.  No  midline shift.  There may be small amount of pleural fluid as well.  Bibasilar opacities.  Heart size upper normal. Central vascular congestion and peribronchial thickening.  No interval osseous change.  IMPRESSION: Right sided chest tube, with increased subcutaneous gas.  Bibasilar opacities; atelectasis versus infiltrate.  Peribronchial thickening appears increased.   Original Report Authenticated By: Jearld Lesch, M.D.     Discharge Orders    Future Appointments: Provider: Department: Dept Phone: Center:   10/18/2012 1:00 PM Loreli Slot, MD [facility] [place] [phone] [facility]   10/28/2012 10:15 AM Maurice March, MD [facility] [phone] [facility]      Discharge Medications:   Medication List     As of 09/29/2012 11:11 AM    TAKE these medications         calcium carbonate 750 MG chewable tablet   Commonly known as: TUMS EX   Chew 2 tablets by mouth daily.      FLUoxetine 20 MG tablet   Commonly known as: PROZAC   Take 20 mg by mouth every 3 (three) days.      levocetirizine 5 MG tablet   Commonly known as: XYZAL   Take 5 mg by mouth daily.      metoprolol succinate 50 MG 24 hr tablet   Commonly known as: TOPROL-XL   Take 50 mg by mouth daily. Take with or immediately following a meal.      naproxen sodium 220 MG tablet   Commonly known as: ANAPROX   Take 220 mg by mouth daily as needed. For pain      omeprazole 20 MG capsule   Commonly known as: PRILOSEC   Take 20 mg by mouth daily.      pravastatin 40 MG tablet   Commonly known as: PRAVACHOL   Take 40 mg by mouth daily.      traMADol 50 MG tablet   Commonly known as: ULTRAM   Take 1-2 tablets (50-100 mg total) by mouth every 6 (six) hours as needed for pain.        Follow Up Appointments:     Follow-up Information    Follow up with HENDRICKSON,STEVEN C, MD. (PA/LAT CXR to be taken (at [facility] which is in the same building as  Dr. Sunday Corn office) on 10/18/2012 at 12:00 pm; Appointment with Dr. Dorris Fetch is on 10/18/2012 at 1:00 pm)    Contact information:   [address] [employer] [address] [place] [address] [phone]          Signed: Doree Fudge MPA-C 09/29/2012, 11:11 AM

## 2012-09-29 NOTE — Progress Notes (Addendum)
                   [address]            Rhonda [name] [address]          [phone]    1 Day Post-Op Procedure(s) (LRB): VIDEO ASSISTED THORACOSCOPY (Right) LUNG BIOPSY (N/A)  Subjective: Patient's pain is better while she is sitting up. Has no other complaints  Objective: Vital signs in last 24 hours: Temp:  [97.1 F (36.2 C)-98.8 F (37.1 C)] 98.8 F (37.1 C) (11/07 0324) Pulse Rate:  [47-88] 77  (11/07 0324) Cardiac Rhythm:  [-] Normal sinus rhythm (11/07 0324) Resp:  [13-24] 20  (11/07 0324) BP: (105-143)/(50-86) 111/53 mmHg (11/07 0324) SpO2:  [85 %-98 %] 95 % (11/07 0324) Arterial Line BP: (118-178)/(54-93) 139/59 mmHg (11/06 2000) Weight:  [156 lb 8 oz (70.988 kg)] 156 lb 8 oz (70.988 kg) (11/06 1319)     Intake/Output from previous day: 11/06 0701 - 11/07 0700 In: 2880 [P.O.:480; I.V.:2400] Out: 2255 [Urine:2125; Blood:100; Chest Tube:30]   Physical Exam:  Cardiovascular: RRR Pulmonary: Clear to auscultation bilaterally; no rales, wheezes, or rhonchi. Abdomen: Soft, non tender, bowel sounds present. Wounds: Dressing is clean and dry.  Chest Tube: to suction and no air leak  Lab Results: CBC: Basename 09/29/12 0400 09/26/12 1144  WBC 11.0* 10.8*  HGB 12.0 14.8  HCT 36.3 44.7  PLT 155 218   BMET:  Basename 09/29/12 0400 09/26/12 1144  NA 136 139  K 3.6 4.3  CL 103 103  CO2 26 28  GLUCOSE 132* 102*  BUN 9 15  CREATININE 0.78 0.90  CALCIUM 8.8 10.0    PT/INR:  Basename 09/26/12 1144  LABPROT 13.6  INR 1.05   ABG:  INR: Will add last result for INR, ABG once components are confirmed Will add last 4 CBG results once components are confirmed  Assessment/Plan:  1. CV - SR. 2.  Pulmonary - Blake drain with 30 cc for last 24 hours. No air leak.CXR not taken this am.Encourage incentive spirometer. 3.Remove a line and foley 4.Decrease IVF 5.Supplement potassium  ZIMMERMAN,Rhonda [name] MPA-C 09/29/2012,7:30 AM   Patient seen and  examined Agree with above Will place CT to water seal- recheck CXR at noon- if OK will d/c CT

## 2012-09-29 NOTE — Op Note (Signed)
[name], [name]             ACCOUNT NO.:  [id number]  MEDICAL RECORD NO.:  [id number]  LOCATION:  3302                         FACILITY:  [facility]  PHYSICIAN:  Salvatore Decent. Dorris Fetch, M.D.DATE OF BIRTH:  09-03-1951  DATE OF PROCEDURE:  09/28/2012 DATE OF DISCHARGE:                              OPERATIVE REPORT   PREOPERATIVE DIAGNOSIS:  Interstitial lung disease.  POSTOPERATIVE DIAGNOSIS:  Interstitial lung disease.  PROCEDURE:  Right VATS lung biopsy.  SURGEON:  Salvatore Decent. Dorris Fetch, M.D.  ASSISTANT:  Doree Fudge, PA.  ANESTHESIA:  General.  FINDINGS:  Lung edematous.  Frozen section from right upper lobe showed inflammatory changes.  No definitive diagnosis could be made.  CLINICAL NOTE:  Rhonda [name] is a 61 year old woman, who has been followed for interstitial lung disease.  She has had persistent shortness of breath and decreased energy.  She initially did not want the lung biopsy, but now with progressive symptoms wishes to have a biopsy done for definitive diagnosis.  The indications, risks, benefits, and alternatives were discussed in detail with the patient.  She understood that this was a diagnostic and not a therapeutic procedure, and she accepted the risks and agreed to proceed.  OPERATIVE NOTE:  Ms. Ra was brought to the preoperative holding area on September 28, 2012, there Anesthesia placed an arterial line and established intravenous access.  She was taken to the operating room, anesthetized, and intubated with a double-lumen endotracheal tube. Intravenous antibiotics were administered.  Foley catheter was placed. PAS hose were placed for DVT prophylaxis.  She was placed in a left lateral decubitus position and the right chest was prepped and draped in usual sterile fashion.  Single lung ventilation of the left lung was carried out.  She initially had some desaturation with this, but after repositioning the tube, she then tolerated it well.   An incision was made in the midaxillary line approximately seventh intercostal space, was carried through the skin and subcutaneous tissue.  The chest was entered bluntly using a hemostat.  The port was inserted through the incision and a thoracoscope was placed into the chest.  There was no effusion.  The lung was relatively pale in coloration.  A small utility incision was made.  No rib spreading was utilized during the procedure. The area at the confluence of the upper middle lobes anteriorly was the area where there was the most significant changes on CT scan.  This area was biopsied with sequential firings of endoscopic GIA stapler.  There was good hemostasis of staple lines.  The lung was relatively edematous. This specimen was sent for frozen section.  A small piece was removed and sent for AFB and fungal cultures.  The remainder of the specimen was sent to Pathology.  While awaiting the results of the frozen section, biopsies were taken from abnormal-appearing areas in both the middle and lower lobe, again with sequential firings of an endoscopic GIA Stapler. There was good hemostasis at these staple lines.  The frozen section ultimately revealed significant inflammation and some lymphocytic infiltrate.  No definitive diagnosis could be made on the frozen section.  Final inspection was made for hemostasis of the staple lines.  A 28-French chest  tube was placed through the original port incision and directed to the apex.  It was secured with #1 silk suture.  The right lung was reinflated.  The utility incision was closed with a running #1 Vicryl fascial suture to closed the muscular layer followed by 2-0 Vicryl subcutaneous suture and a 3-0 Vicryl subcuticular suture.  All sponge, needle, and instrument counts were correct at the end of the procedure.  There were no intraoperative complications.  The patient was taken from the operating room to the Postanesthetic Care Unit in good  condition.     Salvatore Decent Dorris Fetch, M.D.     SCH/MEDQ  D:  09/28/2012  T:  09/29/2012  Job:  [id number]

## 2012-09-29 NOTE — Progress Notes (Signed)
Patient comfortable  No air leak on water seal although there is chatter in the tubing  Follow up CXR [facility]  D/c CT

## 2012-09-30 ENCOUNTER — Inpatient Hospital Stay (HOSPITAL_COMMUNITY): Payer: BC Managed Care – PPO

## 2012-09-30 LAB — TYPE AND SCREEN
ABO/RH(D): A NEG
Antibody Screen: POSITIVE
DAT, IgG: NEGATIVE
Donor AG Type: NEGATIVE
Unit division: 0
Unit division: 0

## 2012-09-30 LAB — COMPREHENSIVE METABOLIC PANEL
AST: 23 U/L (ref 0–37)
Alkaline Phosphatase: 85 U/L (ref 39–117)
BUN: 10 mg/dL (ref 6–23)
CO2: 27 mEq/L (ref 19–32)
Chloride: 105 mEq/L (ref 96–112)
Creatinine, Ser: 0.88 mg/dL (ref 0.50–1.10)
GFR calc non Af Amer: 68 mL/min — ABNORMAL LOW (ref >90)
Potassium: 4 mEq/L (ref 3.5–5.1)
Total Bilirubin: 0.3 mg/dL (ref 0.3–1.2)

## 2012-09-30 LAB — CBC
HCT: 38 % (ref 36.0–46.0)
MCV: 88.8 fL (ref 78.0–100.0)
RBC: 4.28 MIL/uL (ref 3.87–5.11)
WBC: 11.6 10*3/uL — ABNORMAL HIGH (ref 4.0–10.5)

## 2012-09-30 MED ORDER — SODIUM CHLORIDE 0.9 % IJ SOLN
10.0000 mL | Freq: Two times a day (BID) | INTRAMUSCULAR | Status: DC
  Administered 2012-09-30: 10 mL
  Filled 2012-09-30: qty 20

## 2012-09-30 MED ORDER — SODIUM CHLORIDE 0.9 % IJ SOLN: 10.0000 mL | INTRAMUSCULAR | Status: DC | PRN

## 2012-09-30 NOTE — Progress Notes (Signed)
Pt d/c home per MD order, CL d/c pt tol well, d/c instruction and prescriptions given, pt VSS,family at [facility], pt verbalized understanding of d/c

## 2012-09-30 NOTE — Progress Notes (Signed)
2 Days Post-Op Procedure(s) (LRB): VIDEO ASSISTED THORACOSCOPY (Right) LUNG BIOPSY (N/A) Subjective: C/o right flank pain with deep breaths, "maybe diverticulitis or something" No dyspnea  Objective: Vital signs in last 24 hours: Temp:  [98.1 F (36.7 C)-99.4 F (37.4 C)] 99.4 F (37.4 C) (11/08 0357) Pulse Rate:  [64-89] 89  (11/08 0720) Cardiac Rhythm:  [-] Normal sinus rhythm (11/08 0720) Resp:  [13-21] 13  (11/08 0720) BP: (92-145)/(43-74) 128/69 mmHg (11/08 0719) SpO2:  [87 %-96 %] 94 % (11/08 0720) Arterial Line BP: (115)/(71) 115/71 mmHg (11/07 0800)  Hemodynamic parameters for last 24 hours:    Intake/Output from previous day: 11/07 0701 - 11/08 0700 In: 380 [P.O.:120; I.V.:260] Out: 900 [Urine:900] Intake/Output this shift:    General appearance: alert and no distress Lungs: rales bibasilar Abdomen: soft and nontender  Lab Results:  Basename 09/30/12 0430 09/29/12 0400  WBC 11.6* 11.0*  HGB 12.6 12.0  HCT 38.0 36.3  PLT 168 155   BMET:  Basename 09/30/12 0430 09/29/12 0400  NA 138 136  K 4.0 3.6  CL 105 103  CO2 27 26  GLUCOSE 126* 132*  BUN 10 9  CREATININE 0.88 0.78  CALCIUM 8.9 8.8    PT/INR: No results found for this basename: LABPROT,INR in the last 72 hours ABG    Component Value Date/Time   PHART 7.410 09/29/2012 0500   HCO3 25.5* 09/29/2012 0500   TCO2 26.8 09/29/2012 0500   ACIDBASEDEF 0.9 09/26/2012 1143   O2SAT 91.3 09/29/2012 0500   CBG (last 3)  No results found for this basename: GLUCAP:3 in the last 72 hours  Assessment/Plan: S/P Procedure(s) (LRB): VIDEO ASSISTED THORACOSCOPY (Right) LUNG BIOPSY (N/A) POD # 3 R VATS, lung biopsy Overall doing well D/c PCA I suspect pain is incisional/ pleuritic rather than GI related Will see how she does with breakfast and PO pain meds, d/c this afternoon if OK   LOS: 2 days    Hernando Reali C 09/30/2012

## 2012-09-30 NOTE — Progress Notes (Signed)
50mL of PCA Fentanyl (500mg ) wasted in sink of pt's room @ 2130. Witnessed by Leta Baptist, RN.

## 2012-10-01 ENCOUNTER — Ambulatory Visit (INDEPENDENT_AMBULATORY_CARE_PROVIDER_SITE_OTHER): Payer: BC Managed Care – PPO | Admitting: Family Medicine

## 2012-10-01 ENCOUNTER — Encounter: Payer: Self-pay | Admitting: Family Medicine

## 2012-10-01 VITALS — BP 132/73 | HR 114 | Temp 98.4°F | Resp 16 | Ht 60.5 in | Wt 156.0 lb

## 2012-10-01 DIAGNOSIS — K5900 Constipation, unspecified: Secondary | ICD-10-CM

## 2012-10-01 DIAGNOSIS — L259 Unspecified contact dermatitis, unspecified cause: Secondary | ICD-10-CM

## 2012-10-01 DIAGNOSIS — J8410 Pulmonary fibrosis, unspecified: Secondary | ICD-10-CM

## 2012-10-01 LAB — TISSUE CULTURE: Culture: NO GROWTH

## 2012-10-01 MED ORDER — TRIAMCINOLONE ACETONIDE 0.1 % EX CREA: TOPICAL_CREAM | Freq: Two times a day (BID) | CUTANEOUS | Status: DC

## 2012-10-01 NOTE — Patient Instructions (Addendum)
Drink plenty of fluids and eat fruits and vegetables.  MiraLax once or twice daily initially. Once bowels are moving well decrease to one half packet for several days then on as-needed basis. It  Fleets enema if needed

## 2012-10-01 NOTE — Progress Notes (Signed)
Subjective: Patient is here for followup with regard to her recent hospital stay for pulmonary biopsy for pulmonary fibrosis. She had a central line placed in her right upper chest there are. This was removed and the area dressed with a Tegaderm type of a dressing. She's developed a rash underneath the dressing. She thinks it's from the soap they washed her chest wall with, some Betadine-like substance. She is allergic to Betadine.  She is still tramadol for pain.  She has had a lot of trouble with constipation post procedure. This probably from the pain medications they gave her.  Objective: Pleasant lady in no major distress. Chest is clear to auscultation. Heart regular. The synthetic dressing can be seen through, and there are blisters underneath it. It is erythematous. Apparently are still in place. I removed the Tegaderm and scanned stayed intact, fortunately not terribly blisters open. This was washed with water.  Assessment: Contact dermatitis, probably from cleaning substance use her chest wall. Pulmonary fibrosis, biopsy reports pending Constipation secondary to pain meds  Plan: See discharge instruction

## 2012-10-03 LAB — GLUCOSE, CAPILLARY: Glucose-Capillary: 110 mg/dL — ABNORMAL HIGH (ref 70–99)

## 2012-10-04 ENCOUNTER — Telehealth: Payer: Self-pay | Admitting: Internal Medicine

## 2012-10-04 NOTE — Telephone Encounter (Signed)
Called to To discuss biopsy lung that shows chronic HP with fibrosis nut had to lmtcb. Then, told Rhonda [name] to give appt to discuss results

## 2012-10-11 ENCOUNTER — Ambulatory Visit (INDEPENDENT_AMBULATORY_CARE_PROVIDER_SITE_OTHER): Payer: BC Managed Care – PPO | Admitting: Internal Medicine

## 2012-10-11 ENCOUNTER — Encounter: Payer: Self-pay | Admitting: Internal Medicine

## 2012-10-11 VITALS — BP 122/80 | HR 72 | Temp 97.7°F | Ht 60.5 in | Wt 154.2 lb

## 2012-10-11 DIAGNOSIS — J679 Hypersensitivity pneumonitis due to unspecified organic dust: Secondary | ICD-10-CM

## 2012-10-11 NOTE — Patient Instructions (Addendum)
##WEight Management    - we discussed extensively about weight management   - follow low glycemic diet plan that I outlined for you after extensive discussion. Do not follow other plans  - General  - drink lot of water  - avoid all moderate and high glycemic foods especially bad fruits, breads, pastas, fried foods, battered foods, sugary foods  - make non-starchy vegetables your base in terms of volume you eat  - always make sure you balance good carbs, good protein and good fat source  - good carbs are non-starchy vegetables, uncanned beans in the left column and low glycemic fruits in the left colum  - good protein source is egg white, beans, tofu, fish, chicken breast, fish, [place] and bison. Remember meat has to be skinless  - good healthy fat source is nuts, and fish   - focusing on eating right healthy foods (left lane) and avoiding unhealthy foods (middle and right lane) is better way to lose weight than to go hypo-caloric  - focus on staying full by eating right  - having a daily and weekly plan for what you will eat and where you will eat depending on your work, social life schedule is very important   - watch out for misleading labels on grocery aisle: High Fiber and Low Fat labeled foods generally are high in bad carbs or sugar  - measure weight once  a week  - discipline and attitude is key. Do not care for anyone else's opinion or feelings. Only yours matters   - For breakfast  - most important meal of the day. So, eat daily breakfast. Do not skip   - recommend 1/2 to 1 cup steel cut oat meal or 1/2 to 1 cup fiber one 60 cal   Or  1 to 1.5 cups Kashi go-lean with non-fat plain milk or 60 Cal Silk Soy mild. Can add Berries. Can have egg at same time for breakfast  - For snacks  - recommend total 2-3 snacks per day  - snack should be light and filling  - best times are between breakfast and lunch, lunch and dinner and sometimes post-dinner snack  - Nut are great snacks. Stick to  low glycemic nuts (less than 50gm per day) and eat only the nuts in the left lane like peanuts, pista, almond, walnut  - Low glycemic fruits are great snacks. Have 1-2 servings each day of fruits from the left lane   - If you like yogurt or cottage cheese - recommend Oikos or Fage 0% greek yogourt or Plan non-fat yogurt or Breakstone non-fat cottage cheese. Theyse have the least sugar. Do no exceed 100-200 gram per day. Fruit yogurts are the worst  - For Lunch and dinner  - unlimited non-starchy vegetable (prefer raw fresh or roasted or grilled) with skinless chicken or fish  - Special Notes  - Nuts: Nut are great snacks and have heart benefits. Stick to low glycemic nuts (less than 50gm per day) and eat only the nuts in the left lane like peanuts, pista, almond, walnuts  -  Ok to eat above nuts daily but only < 50gm/day  - If you eat more than 50gm/day then you run risk of eating too many calories or saturated fat  - AVoid nuts glazed with sugar. Nuts have to be in salted/original form or roasted   - Fruits: Eat 1-2 fresh fruit servings daily but fruits can be dangerous because of high sugar content. So, choose your fruits  wisely. Eat only the low glycemic fruits (left lane). Eat them fresh.  Do not eat them canned  - Avoid all fresh juices except if you use the low glycemic fruits and make them yourself without adding extra sugar   - Dairy: Is optional. Eat zero fat or low fat, fruit free yogurts or cottage cheese but not more than 100-200g per day  - Restaurant  - all restaurants have bad and good choices. Even fast food restaurants offer you good choices  - at restaurants do no fall prey to social pressure.. One way to eat healthy at restaurant is to eat healthy snack or light healthy meal before you go to restaurant so that will prevent your cravings  - Restaurants with worst choices: [place] (except [employer], or [employer]), [place], [place]. At these restaurants avoid the bread, curry,  fried and battered foods and chips  - Restaurants with best choices: greek, [place], [place], [place] (again here avoid bread, deep fried stuffed and pasta)  - Restaurants with Ok choice: [employer], [employer], [employer] (again here avoid the bread, fried stuff, fried meat)  - Always ask for grilled meat or vegetables, and fresh salad choices (get your salad dressing as low fat and to the side)   #HYPERSENSITIVITY PNEUMONITIS  - you have this  - discussed inciting agents - you need to do your best to remove them. If epoxy is in your oil paint stop it. Ensure no mold anywhere - set up overnight oxygen study - return in 6-8 weeks to start sterid treatment - need weight loss by then and have PFTs at time of followup

## 2012-10-11 NOTE — Progress Notes (Signed)
Subjective:    Patient ID: Rhonda [name], female    DOB: [birth year], 61 y.o.   MRN: [id number]  HPI IOV 09/15/11: 61 year old, non-smoker. GE Reflux with PPI. 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 Admitted 08/19/11 - 08/20/11 with acute exacerbation of chronic dyspnea. Noted to be hypoxemic. No specific diagnosis given but discharged on prednisone burst and antibiotic burst. But really her problem is dyspnea for several years. 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 But as an adult, insidious onset of dyspnea for few years. Associated with cough and small amounts of thick white sputum for a few years as well esp early in the morning. Dyspnea brought on by walking any  Incline and on many occassions climbing 1 flight of stairs. Associated dizziness +. Able to swim without problem (swims alt day x several years). Rates dyspnea as progressive and as moderate.  Denies chest pain but occ feels some rib pain. BEnding makes dyspnea worse.  Walked on room aior - pulse ox at rest 92%. AFter exertin 185 ft x 3 laps: pulse ox 90%. HR 108  Cough present for 1 year. RSI score 15 with PPI and 18 without PPI. Ass clearing of throat all the time present. Associated small amount of sputum +. Symptoms imprved by nasal steroid spray. Noted to have allergies and on singulair for several years. Also xyzal in morning. Also on asmanex for several years. Noted to be on beta blocker lopressor.   Review of chart shows 10/15/09 recollects dyspnea on exertion on a trip to [place]. CT chest ruled out PE and reported as normal parenchyma (non-specific ILD in my opinion). Current 08/19/11 cxr shows some peri-bronchial thickening.   Note she has had 2003 CT chest - no film avaialble but reported as no "pulm fibrosis":  OV 10/29/2011 After last visit got PFTs.  Reviewed pft dated 10/05/11. - PFTs normal lung strength and mechanics but low diffusion. So got CT chest 10/13/11 and had Dr Fredirick Lathe review it: She felt, "Looking back at her study on 10/15/09, she did have ILD at that time and it has progressed slightly on the current study of 10/13/11.  I would consider post-inflammatory fibrosis, chronic HP or less likely NSIP.  No honeycombing to suggest UIP, and there are areas of more central parenchymal involvement which also mitigates against UIP". Then, he had autoimmune panel: 10/23/11: Autoimmune panel: ANA, dsDNA, ccp, RF, scl70, ssA, ssB - all negative. ACE level normal. HP panel - pending  Today fu for above: now cold x 3 days, sore throat x 3 days, lot of people in community sick. Mild congestion. Coughing greenish brown sputum esp in morning - small amounts, increased in amounts by hot shower or mucinex. Walking in office: desaturated again  Snores a lot - awful per hsuband but states no witnessed apneic spells. No Xs day time somnolence but takes 3pm naps   OV 12/09/2011 FU test ILD and cough. For ILD here to discuss result:: Autoimmine and HP panel negative. Uderwennt bronch 11/19/11  - non diagnostic. 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 No change. Noticed it when climbing steps or searching for dog in yard or bending. Walking pulse ox unchanged with lowest pulse ox 90%.  Tbbx. In terms of cough: using flonase properly and cough and  sinus drainage improved. RSI cough score improved to 8. In terms of sleep apnea: overnight oxygen study was normal and decline sleep apnea workup.  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 States she is unable to feel it with hand but can hear it. Not related to eating. Does not happen with sleep. Can happen randomly. Lasts 3-5  minutes  Hx re-take with husband and patient: long standing hx with bronchitis episodes and dyspnea even in teen years. Reports recent issues handling URIs - has severe wheezing and dyspnea when gets URI. PRednisone once helped.  Husband reports that patient cleaned bird cage daily (2 birds) very dirty and full of dust. Denies feather pillows  OV 08/11/2012  Since last visit in Nov 2012; decided against VATS.  So did not followup. I had to specially request she come in. [facility] tells me she did not want VATS bx due to fear of infection risk. Since last visit, reports 3# ? Intentional weight loss. Overall well. Feels dyspnea some better since a year ago but still walking hill or ocassionally with flight of stairs will get dyspneic. There are good days and bad day to this. Lifiting heavy garbage or bending over can cause dyspnea but able to do ADLs or yard work. No new associated rashes or joint issues other than chronic OA. She has cut down from work because she wants to avoid URI exposure from children.  She got rid of birds last year and has not gotten them back. She was away from oil painting but recently has restarted it.   Exam shows new crackles   Walking desaturation test 02/10/2012:  185 feet x 3 laps: 92% at rest but by  3rd lap 88% but was 86% half way through 3rd lap., HR response 89/min to 102/min. This is worse than 2012  And Jan 2013 when she desaturated only to 90%  PFTs 08/11/2012    - Shows worsening FVC, FEv1 and TLC but stable dlco cmpared to nov 2012.   - FVC is 2.22L/83% which is down from 2.6L. Fev1 is 1.7L/90% and down from 1.9L. TLC is 3.3L/79% down from 4.3L  CT chest 08/16/12  -  IMPRESSION:  1. Redemonstration of interstitial lung disease. No significant  change. Consolation of findings is nonspecific. Considerations  include nonspecific interstitial pneumonitis, atypical appearance  of usual interstitial pneumonitis, or post infectious fibrosis.  2. No evidence of acute  superimposed pulmonary process.  3. Similar mild thoracic adenopathy, likely reactive.  4. Age advanced coronary artery atherosclerosis. Recommend  assessment of coronary risk factors and consideration of medical  therapy.  5. Small hiatal hernia.  6. Hepatic steatosis.  REC Have flu shot today  Your breathing test and walk test suggest that your INterstitial Lung disease is slowly progressive compared to nov 2012  I strongly recommend you get VATS procedure lung biopsy by our surgeons  - the rationale for biopsy is diagnostic certainty, therapeutic options and duration and anticipated approval of newer drugs in next few years  - your risk for complications from VATS biopsy is low; they will test for MRSA when you are admitted for biopsy  - You can meet with Drs Morton Peters or B Bartle or Tyrone Sage or [place] for the same  Please call me after meeting with surgeon  Return to see  me after biopsy   OV 09/19/2012  VATS biopsy scheduled 09/28/12 but has questions . She is now here with husband. They are still concerned about indication for VATS lung biopsy and risk benefit ratio.     - She is wondering if this is bird related pneumonia and if she should take doxycycline:  . Says overall feelin great, doing ADLs. Swimming and losing weight. . Per report: walking desaturation test at PMD office was normal and did not go below 90s. However, when I question her more closely she swimming 9 laps and then mild dyspneic. Climbing steps with load esp increasing load or bending over can increase dyspnea. Bending still makes her dsypneic. Reports ongoing issue with GERD that is constant. Hiatal hernia is small. She now recognizes that her GERD increased 3 years ago around the time first time ILD was diagnosed   - Offered [facility]nd [facility] at [facility]: refused  #ILD  Discussed rationale for lung biopsy - still recommend it. 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 However, there is worrying trend of UIP pattern in the Upper lobes.   We discussed her exposures again in more details and they are summarized below. Most worrying is her home environemebt   Home exposures   - Negative for: She denies using feather pillows or using sauna or hot tub. Denies mold on clothes or wet carpets. Denies dish washer leakage. Denies musty smell in house (but this was a problem in 2011-2012) and denies hx of flooding, water damange to ceilings/walls.  No carpets in down floor. Upstairs new carpets and this not wet   - Questionable exposures: House 1938 built.. Has sun room full of plants - occ mushrooms in this but no mold. Does have humidifier because dry air makes her cough but denies it has mold. No mildew in house/bathroom.  However, their basement is old and she says is "Weird" but they do not go there. Duct system they think should be free of mold but not sure.     - The main exposures appear to be the birds for > 20 years which past 1 year no longer exposed to. 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 She remembers musty smell in unused barn and in  [place] house last year which really triggerd her resp issues (sept 2012 admission) and needed prednisone. 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  HENT: Negative for ear pain, nosebleeds, congestion, sore throat, rhinorrhea, sneezing, trouble swallowing, dental problem, postnasal drip and sinus pressure.   Eyes: Negative for redness and itching.  Respiratory: Negative for cough, chest tightness, shortness of breath and wheezing.   Cardiovascular: Negative for palpitations and leg swelling.  Gastrointestinal: Negative for nausea and vomiting.  Genitourinary: Negative for dysuria.  Musculoskeletal: Negative for joint swelling.  Skin: Negative for rash.  Neurological: Negative for headaches.  Hematological: Does not bruise/bleed easily.  Psychiatric/Behavioral: Negative for dysphoric mood. The patient is not nervous/anxious.        Objective:   Physical Exam Vitals reviewed. Constitutional: She is oriented to person, place, and time. She appears well-developed and well-nourished. No distress.   159# - says 3# weight losss since last OV in Nov 2012 Filed Weights   09/19/12 1434  Weight: 158 lb 6.4 oz (71.85 kg)   Estimated Body mass index is 29.62 kg/(m^2) as calculated from the following:   Height as of this encounter: 5' .5"(1.537 m).   Weight as of this encounter: 154 lb 3.2 oz(69.945 kg).   No murmur heard. Pulmonary/Chest: Effort normal. No respiratory distress. She has no wheezes. She has rales. She exhibits no tenderness.       Rales anteriorly - this is NEW 08/11/2012  None posteriorly   Discussion only visit        Assessment & Plan:

## 2012-10-12 NOTE — Assessment & Plan Note (Addendum)
Bx shows Hypersenstivity Pneumonitis with UIP features esp in upper lobe.   EXtensive discussion on  A)  etiology for hypersensitivity pneumonitis. No doublt birds caused this but explained to her we cannot rest assuming that was the only inciting antigen. Explained that removal of inciting antigen is a key feature of mgmt of disease and ultimately will determine progression to permanent fibrosis. After detailed hx taking on this, I have askd her to do mold evaluation, ensure plants have no mold in the home, avoid compost, ensure no epoxy in oil pain and ensure no mildew  B) explained that she needs chronic prednisone for few to several months to clear active inflammation. Explained all hte risks of prednisone. Given her Metabolic Syndrome which will get worse with prednisone we agreed we would wait several weeks (4-6 weeks) to help her ensure a low glycemic diet and continue her weight loss before embarking on prednisone. I will see her with full PFT  C) oxygen desaturation - will test her overnight oxygen and if desaturates wll start o2. IF ONO positive, wil start pulmonary rehab too  D) explained that parts of her lung has irreversible damage and prednisone will never get this back  E) explained that since last year there is evidence of worsening so inciting antigen probably still around though birds are removed  F) gave weight loss advice and to follow low glycemic diet  G) explained failure to remove inciting antigen and take prednisone will result in progressive irreversible chronic respiratory failure and death over years  > 50% of this 40 min visit spent in face to face counseling (15 min visit converted to 40 min)

## 2012-10-14 ENCOUNTER — Other Ambulatory Visit: Payer: Self-pay | Admitting: Thoracic Surgery (Cardiothoracic Vascular Surgery)

## 2012-10-14 DIAGNOSIS — I340 Nonrheumatic mitral (valve) insufficiency: Secondary | ICD-10-CM

## 2012-10-17 ENCOUNTER — Ambulatory Visit: Payer: BC Managed Care – PPO | Admitting: Internal Medicine

## 2012-10-17 LAB — PULMONARY FUNCTION TEST

## 2012-10-18 ENCOUNTER — Ambulatory Visit
Admission: RE | Admit: 2012-10-18 | Discharge: 2012-10-18 | Disposition: A | Discharge status: Home / Self Care | Payer: BC Managed Care – PPO | Source: Ambulatory Visit | Attending: Thoracic Surgery (Cardiothoracic Vascular Surgery) | Admitting: Thoracic Surgery (Cardiothoracic Vascular Surgery)

## 2012-10-18 ENCOUNTER — Ambulatory Visit (INDEPENDENT_AMBULATORY_CARE_PROVIDER_SITE_OTHER): Payer: BC Managed Care – PPO | Admitting: Thoracic Surgery (Cardiothoracic Vascular Surgery)

## 2012-10-18 ENCOUNTER — Encounter: Payer: Self-pay | Admitting: Thoracic Surgery (Cardiothoracic Vascular Surgery)

## 2012-10-18 VITALS — BP 106/64 | HR 80 | Resp 16 | Ht 61.0 in | Wt 158.0 lb

## 2012-10-18 DIAGNOSIS — J8409 Other alveolar and parieto-alveolar conditions: Secondary | ICD-10-CM

## 2012-10-18 DIAGNOSIS — Z09 Encounter for follow-up examination after completed treatment for conditions other than malignant neoplasm: Secondary | ICD-10-CM

## 2012-10-18 DIAGNOSIS — J849 Interstitial pulmonary disease, unspecified: Secondary | ICD-10-CM

## 2012-10-18 DIAGNOSIS — I340 Nonrheumatic mitral (valve) insufficiency: Secondary | ICD-10-CM

## 2012-10-18 DIAGNOSIS — J8410 Pulmonary fibrosis, unspecified: Secondary | ICD-10-CM

## 2012-10-18 NOTE — Progress Notes (Signed)
  HPI:  Rhonda [name] is a 61 year old woman who had a lung biopsy for interstitial lung disease on 09/28/2012. This turned out to be hypersensitivity pneumonitis. She did well postoperatively and was discharged on day 3. She's continued to do well since discharge. She was able to go swimming today without difficulty. She does still have some "irritating" of pain particularly when her breast rubs on the incision, but otherwise feels well. She's not having to take any narcotics.  Past Medical History  Diagnosis Date  . SOB (shortness of breath)   . High cholesterol   . History of chronic bronchitis     as child  . GERD (gastroesophageal reflux disease)   . Arthritis   . Depression   . Allergy   . Asthma   . Cataract   . Interstitial lung disease   . Rapid heart rate       Current Outpatient Prescriptions  Medication Sig Dispense Refill  . acetaminophen (TYLENOL) 500 MG tablet Take 500 mg by mouth every 6 (six) hours as needed.      . calcium carbonate (TUMS EX) 750 MG chewable tablet Chew 2 tablets by mouth daily.      Marland Kitchen levocetirizine (XYZAL) 5 MG tablet Take 5 mg by mouth daily.       . metoprolol succinate (TOPROL-XL) 50 MG 24 hr tablet Take 50 mg by mouth daily. Take with or immediately following a meal.      . naproxen sodium (ANAPROX) 220 MG tablet Take 220 mg by mouth 2 (two) times daily as needed.      Marland Kitchen omeprazole (PRILOSEC) 20 MG capsule Take 20 mg by mouth daily.      . pravastatin (PRAVACHOL) 40 MG tablet Take 40 mg by mouth daily.        Physical Exam BP 106/64  Pulse 80  Resp 16  Ht 5\' 1"  (1.549 m)  Wt 158 lb (71.668 kg)  BMI 29.85 kg/m2  SpO6 42% 61 year old woman in no acute distress Lungs clear with equal breath bilaterally Incisions healing well  Diagnostic Tests: Chest x-ray 10/18/2012 Clinical Data: Lung biopsy, benign, history of interstitial lung  disease (UIP)  CHEST - 2 VIEW  Comparison: None.  Findings: Heart size and vascular pattern are  normal. There is  mild to moderate interstitial change in the mid to lower lung zones  bilaterally, stable when compared to the prior study. There is no  pneumothorax or subcutaneous emphysema.  IMPRESSION:  No acute findings. Persistent bilateral intersitital change  similar to prior study, which could represent persistent  atelectasis or interstitial inflammatory change.  Impression: Rhonda [name] is doing extremely well 3 weeks out from a lung biopsy. Her activities are unrestricted at this time.  She has hypersensitivity pneumonitis and the plan is to start her on steroids after she's lost some weight. Her treatments being managed by Dr. Marchelle Gearing  Plan:  I will be happy see Rhonda [name] back at any time if I can be of any further assistance with her care.

## 2012-10-27 LAB — FUNGUS CULTURE W SMEAR

## 2012-10-28 ENCOUNTER — Encounter: Payer: Self-pay | Admitting: Family Medicine

## 2012-10-28 ENCOUNTER — Ambulatory Visit (INDEPENDENT_AMBULATORY_CARE_PROVIDER_SITE_OTHER): Payer: BC Managed Care – PPO | Admitting: Family Medicine

## 2012-10-28 VITALS — BP 128/66 | HR 67 | Temp 98.1°F | Resp 16 | Ht 60.0 in | Wt 148.0 lb

## 2012-10-28 DIAGNOSIS — M25549 Pain in joints of unspecified hand: Secondary | ICD-10-CM

## 2012-10-28 DIAGNOSIS — Z9889 Other specified postprocedural states: Secondary | ICD-10-CM

## 2012-10-28 DIAGNOSIS — Z23 Encounter for immunization: Secondary | ICD-10-CM

## 2012-10-28 DIAGNOSIS — I2510 Atherosclerotic heart disease of native coronary artery without angina pectoris: Secondary | ICD-10-CM

## 2012-10-28 DIAGNOSIS — M8580 Other specified disorders of bone density and structure, unspecified site: Secondary | ICD-10-CM | POA: Insufficient documentation

## 2012-10-28 DIAGNOSIS — Z86010 Personal history of colonic polyps: Secondary | ICD-10-CM

## 2012-10-28 DIAGNOSIS — E785 Hyperlipidemia, unspecified: Secondary | ICD-10-CM

## 2012-10-28 DIAGNOSIS — Z0000 Encounter for general adult medical examination without abnormal findings: Secondary | ICD-10-CM

## 2012-10-28 LAB — IFOBT (OCCULT BLOOD): IFOBT: NEGATIVE

## 2012-10-28 LAB — LIPID PANEL
HDL: 46 mg/dL (ref >39)
LDL Cholesterol: 56 mg/dL (ref 0–99)
Total CHOL/HDL Ratio: 2.7 Ratio
Triglycerides: 106 mg/dL (ref <150)
VLDL: 21 mg/dL (ref 0–40)

## 2012-10-28 LAB — BASIC METABOLIC PANEL
Calcium: 9.3 mg/dL (ref 8.4–10.5)
Creat: 1.19 mg/dL — ABNORMAL HIGH (ref 0.50–1.10)
Sodium: 141 mEq/L (ref 135–145)

## 2012-10-28 LAB — POCT URINALYSIS DIPSTICK
Glucose, UA: NEGATIVE
Spec Grav, UA: 1.025
Urobilinogen, UA: 0.2

## 2012-10-28 LAB — GLUCOSE, POCT (MANUAL RESULT ENTRY): POC Glucose: 109 mg/dl — AB (ref 70–99)

## 2012-10-28 MED ORDER — PRAVASTATIN SODIUM 40 MG PO TABS: 40.0000 mg | ORAL_TABLET | Freq: Every day | ORAL | Status: DC

## 2012-10-28 MED ORDER — ONDANSETRON 4 MG PO TBDP
4.0000 mg | ORAL_TABLET | Freq: Three times a day (TID) | ORAL | Status: DC | PRN
Start: 2012-10-28 — End: 2013-07-18

## 2012-10-28 MED ORDER — METOPROLOL SUCCINATE ER 50 MG PO TB24: 50.0000 mg | ORAL_TABLET | Freq: Every day | ORAL | Status: DC

## 2012-10-28 NOTE — Progress Notes (Signed)
Subjective:    Patient ID: Rhonda [name], female    DOB: 16-Dec-1950, 61 y.o.   MRN: [id number]  HPI  This 61 y.o. Cauc female is here for CPE; she was recently diagnosed with Interstitial Lung  Disease and will be starting steroids in near future. Her favorite hobby has been painting w/ oils  but she has been advised to discontinue use of oil paints; this has been difficult for her.  She wants to be sure she is non-diabetic before starting this chronic medication.  She had Novo virus GI infection last week but that has resolved.     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 She enjoys a daily swim and other activities.    Last PAP: 2 years ago (negative per pt)  Last MMG: "recent"  Las ECG: "recent"  Last Colonoscopy: ~ 5 years ago (Dr. Juanda Chance).    Review of Systems  Constitutional: Negative.   HENT: Positive for tinnitus.        Chronic high-pitched ringing in ears for years.  Eyes: Negative.   Respiratory: Positive for cough.        Interstitial lung disease  Cardiovascular: Negative.   Gastrointestinal: Positive for nausea, abdominal pain and abdominal distention.  Genitourinary: Negative.   Musculoskeletal: Positive for arthralgias.       Hands  Skin: Negative.        Has a lot of "moles" on trunk  Neurological: Negative.   Hematological: Negative.   Psychiatric/Behavioral: Negative.        Objective:   Physical Exam  Nursing note and vitals reviewed. Constitutional: She is oriented to person, place, and time. She appears well-developed and well-nourished. No distress.  HENT:  Head: Normocephalic and atraumatic.  Right Ear: Hearing, tympanic membrane, external ear and ear canal normal.  Left Ear: Hearing, tympanic membrane, external ear and ear canal normal.  Nose: Nose normal. No mucosal edema, rhinorrhea, nasal deformity or septal deviation.  Mouth/Throat: Uvula is midline, oropharynx is clear and moist and mucous membranes are normal. No oral lesions. Normal  dentition.  Eyes: Conjunctivae normal, EOM and lids are normal. Pupils are equal, round, and reactive to light. No scleral icterus.  Fundoscopic exam:      The right eye shows no arteriolar narrowing, no hemorrhage and no papilledema. The right eye shows red reflex.      The left eye shows no arteriolar narrowing, no hemorrhage and no papilledema. The left eye shows red reflex. Neck: Normal range of motion. Neck supple. No thyromegaly present.  Cardiovascular: Normal rate, regular rhythm, normal heart sounds and intact distal pulses.  Exam reveals no gallop and no friction rub.   No murmur heard. Pulmonary/Chest: Effort normal. No respiratory distress. She has no wheezes. She exhibits no tenderness.       Fine end-exp crackles heard.  Abdominal: Soft. Bowel sounds are normal. She exhibits no distension, no abdominal bruit, no pulsatile midline mass and no mass. There is no hepatosplenomegaly. There is no tenderness. There is no guarding and no CVA tenderness.  Genitourinary: Rectum normal. Rectal exam shows no fissure, no mass, no tenderness and anal tone normal. Guaiac negative stool. No breast swelling, tenderness, discharge or bleeding.       NEFG; pelvic exam/PAP deferred  Musculoskeletal: Normal range of motion. She exhibits no edema and no tenderness.       Hands- mild degenerative in digits and wrists (good ROM) w/o erythema or deformities.  Lymphadenopathy:    She has no  cervical adenopathy.  Neurological: She is alert and oriented to person, place, and time. She has normal reflexes. No cranial nerve deficit. She exhibits normal muscle tone. Coordination normal.  Skin: Skin is warm and dry. No rash noted. No erythema. No pallor.       Benign-appearing lesions c/w Seb.Ks and multiple red nevi  Psychiatric: She has a normal mood and affect. Her behavior is normal. Judgment and thought content normal.    A1c= 5.9% ; Blood sugar= 109       Assessment & Plan:   1. Routine general  medical examination at a health care facility  POCT urinalysis dipstick Lipid panel, Basic metabolic panel Continue current nutrition modifications  2. Need for prophylactic vaccination with combined diphtheria-tetanus-pertussis (DTP) vaccine  Tdap vaccine greater than or equal to 7yo IM  3. Hyperlipidemia  Continue current nutrition plan to improve lipid profile  4. Hand joint pain  Conservative treatment w/ APAP.  5. Atherosclerosis of coronary artery  Ambulatory referral to Cardiology  6. H/O colonoscopy with polypectomy  Ambulatory referral to Gastroenterology

## 2012-10-28 NOTE — Patient Instructions (Signed)
Keeping You Healthy  Get These Tests  Blood Pressure- Have your blood pressure checked by your healthcare provider at least once a year.  Normal blood pressure is 120/80. Continue current medications.  Weight- Have your body mass index (BMI) calculated to screen for obesity.  BMI is a measure of body fat based on height and weight.  You can calculate your own BMI at https://www.west-esparza.com/  Cholesterol- Have your cholesterol checked every year. You are taking medication for this na dwe have checked those numbers today.  Diabetes- Have your blood sugar checked every year if you have high blood pressure, high cholesterol, a family history of diabetes or if you are overweight.  Pap Smear- Have a pap smear every 1 to 3 years if you have been sexually active.  If you are older than 65 and recent pap smears have been normal you may not need additional pap smears.  In addition, if you have had a hysterectomy  For benign disease additional pap smears are not necessary.  Mammogram-Yearly mammograms are essential for early detection of breast cancer  Screening for Colon Cancer- Colonoscopy starting at age 31. Screening may begin sooner depending on your family history and other health conditions.  Follow up colonoscopy as directed by your Gastroenterologist.  Screening for Osteoporosis- Screening begins at age 77 with bone density scanning, sooner if you are at higher risk for developing Osteoporosis.  Get these medicines  Calcium with Vitamin D- Your body requires 1200-1500 mg of Calcium a day and [phone] IU of Vitamin D a day.  You can only absorb 500 mg of Calcium at a time therefore Calcium must be taken in 2 or 3 separate doses throughout the day.  Hormones- Hormone therapy has been associated with increased risk for certain cancers and heart disease.  Talk to your healthcare provider about if you need relief from menopausal symptoms.  Aspirin- Ask your healthcare provider about taking  Aspirin to prevent Heart Disease and Stroke.  Get these Immuniztions  Flu shot- Every fall  Pneumonia shot- Once after the age of 64; if you are younger ask your healthcare provider if you need a pneumonia shot.  Tetanus- Every ten years. We need to check your chart to see if you have had Tdap which is a one-time vaccine.  Zostavax- Once after the age of 58 to prevent shingles.   Take these steps  Don't smoke- Your healthcare provider can help you quit. For tips on how to quit, ask your healthcare provider or go to www.smokefree.gov or call 1-800 QUIT-NOW.  Be physically active- Exercise 5 days a week for a minimum of 30 minutes.  If you are not already physically active, start slow and gradually work up to 30 minutes of moderate physical activity.  Try walking, dancing, bike riding, swimming, etc.  Eat a healthy diet- Eat a variety of healthy foods such as fruits, vegetables, whole grains, low fat milk, low fat cheeses, yogurt, lean meats, chicken, fish, eggs, dried beans, tofu, etc.  For more information go to www.thenutritionsource.org  Dental visit- Brush and floss teeth twice daily; visit your dentist twice a year.  Eye exam- Visit your Optometrist or Ophthalmologist yearly.  Drink alcohol in moderation- Limit alcohol intake to one drink or less a day.  Never drink and drive.  Depression- Your emotional health is as important as your physical health.  If you're feeling down or losing interest in things you normally enjoy, please talk to your healthcare provider.  Seat Belts- can  save your life; always wear one  Smoke/Carbon Monoxide detectors- These detectors need to be installed on the appropriate level of your home.  Replace batteries at least once a year.  Violence- If anyone is threatening or hurting you, please tell your healthcare provider.  Living Will/ Health care power of attorney- Discuss with your healthcare provider and family.  Continue all your recent nutrition  modifications. No new medications are needed at this time.

## 2012-11-01 NOTE — Progress Notes (Signed)
Quick Note:  Please call pt and advise that the following labs are abnormal... Lipid profile is excellent.  Blood sugar elevated but we discussed nutrition changes. Also kidney function test is above normal; try to maintain good hydration (mostly with water). These tests will be monitored as you go forward with your treatments.   Copy to pt. ______

## 2012-11-03 ENCOUNTER — Telehealth: Payer: Self-pay | Admitting: Internal Medicine

## 2012-11-03 ENCOUNTER — Encounter: Payer: Self-pay | Admitting: *Deleted

## 2012-11-03 NOTE — Telephone Encounter (Signed)
Noct pulse ox  ONO 10/17/12  - total time < 89% - is 4 min, < 88% is 1.8 min  Does not qualify for ONO; just let her know  We will assess walk test again at time of fu

## 2012-11-04 NOTE — Telephone Encounter (Signed)
Pt advised. Miliyah Luper, CMA  

## 2012-11-10 LAB — AFB CULTURE WITH SMEAR (NOT AT ARMC): Acid Fast Smear: NONE SEEN

## 2012-11-17 ENCOUNTER — Other Ambulatory Visit: Payer: Self-pay | Admitting: Internal Medicine

## 2012-11-21 ENCOUNTER — Ambulatory Visit (INDEPENDENT_AMBULATORY_CARE_PROVIDER_SITE_OTHER): Payer: BC Managed Care – PPO | Admitting: Internal Medicine

## 2012-11-21 ENCOUNTER — Encounter: Payer: Self-pay | Admitting: Internal Medicine

## 2012-11-21 VITALS — BP 100/60 | HR 73 | Temp 98.0°F | Ht 61.0 in | Wt 147.0 lb

## 2012-11-21 DIAGNOSIS — J679 Hypersensitivity pneumonitis due to unspecified organic dust: Secondary | ICD-10-CM

## 2012-11-21 LAB — PULMONARY FUNCTION TEST

## 2012-11-21 MED ORDER — PREDNISONE 10 MG PO TABS: ORAL_TABLET | ORAL | Status: DC

## 2012-11-21 NOTE — Progress Notes (Signed)
PFT done today. 

## 2012-11-21 NOTE — Progress Notes (Signed)
Subjective:    Patient ID: Rhonda [name], female    DOB: [birth year], 61 y.o.   MRN: [id number]  HPI IOV 09/15/11: 61 year old, non-smoker. GE Reflux with PPI. 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 Admitted 08/19/11 - 08/20/11 with acute exacerbation of chronic dyspnea. Noted to be hypoxemic. No specific diagnosis given but discharged on prednisone burst and antibiotic burst. But really her problem is dyspnea for several years. 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 But as an adult, insidious onset of dyspnea for few years. Associated with cough and small amounts of thick white sputum for a few years as well esp early in the morning. Dyspnea brought on by walking any  Incline and on many occassions climbing 1 flight of stairs. Associated dizziness +. Able to swim without problem (swims alt day x several years). Rates dyspnea as progressive and as moderate.  Denies chest pain but occ feels some rib pain. BEnding makes dyspnea worse.  Walked on room aior - pulse ox at rest 92%. AFter exertin 185 ft x 3 laps: pulse ox 90%. HR 108  Cough present for 1 year. RSI score 15 with PPI and 18 without PPI. Ass clearing of throat all the time present. Associated small amount of sputum +. Symptoms imprved by nasal steroid spray. Noted to have allergies and on singulair for several years. Also xyzal in morning. Also on asmanex for several years. Noted to be on beta blocker lopressor.   Review of chart shows 10/15/09 recollects dyspnea on exertion on a trip to [place]. CT chest ruled out PE and reported as normal parenchyma (non-specific ILD in my opinion). Current 08/19/11 cxr shows some peri-bronchial thickening.   Note she has had 2003 CT chest - no film avaialble but reported as no "pulm fibrosis":  OV 10/29/2011 After last visit got PFTs.  Reviewed pft dated 10/05/11. - PFTs normal lung strength and mechanics but low diffusion. So got CT chest 10/13/11 and had Dr Fredirick Lathe review it: She felt, "Looking back at her study on 10/15/09, she did have ILD at that time and it has progressed slightly on the current study of 10/13/11.  I would consider post-inflammatory fibrosis, chronic HP or less likely NSIP.  No honeycombing to suggest UIP, and there are areas of more central parenchymal involvement which also mitigates against UIP". Then, he had autoimmune panel: 10/23/11: Autoimmune panel: ANA, dsDNA, ccp, RF, scl70, ssA, ssB - all negative. ACE level normal. HP panel - pending  Today fu for above: now cold x 3 days, sore throat x 3 days, lot of people in community sick. Mild congestion. Coughing greenish brown sputum esp in morning - small amounts, increased in amounts by hot shower or mucinex. Walking in office: desaturated again  Snores a lot - awful per hsuband but states no witnessed apneic spells. No Xs day time somnolence but takes 3pm naps   OV 12/09/2011 FU test ILD and cough. For ILD here to discuss result:: Autoimmine and HP panel negative. Uderwennt bronch 11/19/11  - non diagnostic. 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 No change. Noticed it when climbing steps or searching for dog in yard or bending. Walking pulse ox unchanged with lowest pulse ox 90%.  Tbbx. In terms of cough: using flonase properly and cough and  sinus drainage improved. RSI cough score improved to 8. In terms of sleep apnea: overnight oxygen study was normal and decline sleep apnea workup.  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 States she is unable to feel it with hand but can hear it. Not related to eating. Does not happen with sleep. Can happen randomly. Lasts 3-5  minutes  Hx re-take with husband and patient: long standing hx with bronchitis episodes and dyspnea even in teen years. Reports recent issues handling URIs - has severe wheezing and dyspnea when gets URI. PRednisone once helped.  Husband reports that patient cleaned bird cage daily (2 birds) very dirty and full of dust. Denies feather pillows  OV 08/11/2012  Since last visit in Nov 2012; decided against VATS.  So did not followup. I had to specially request she come in. [facility] tells me she did not want VATS bx due to fear of infection risk. Since last visit, reports 3# ? Intentional weight loss. Overall well. Feels dyspnea some better since a year ago but still walking hill or ocassionally with flight of stairs will get dyspneic. There are good days and bad day to this. Lifiting heavy garbage or bending over can cause dyspnea but able to do ADLs or yard work. No new associated rashes or joint issues other than chronic OA. She has cut down from work because she wants to avoid URI exposure from children.  She got rid of birds last year and has not gotten them back. She was away from oil painting but recently has restarted it.   Exam shows new crackles   Walking desaturation test 02/10/2012:  185 feet x 3 laps: 92% at rest but by  3rd lap 88% but was 86% half way through 3rd lap., HR response 89/min to 102/min. This is worse than 2012  And Jan 2013 when she desaturated only to 90%  PFTs 08/11/2012    - Shows worsening FVC, FEv1 and TLC but stable dlco cmpared to nov 2012.   - FVC is 2.22L/83% which is down from 2.6L. Fev1 is 1.7L/90% and down from 1.9L. TLC is 3.3L/79% down from 4.3L  CT chest 08/16/12  -  IMPRESSION:  1. Redemonstration of interstitial lung disease. No significant  change. Consolation of findings is nonspecific. Considerations  include nonspecific interstitial pneumonitis, atypical appearance  of usual interstitial pneumonitis, or post infectious fibrosis.  2. No evidence of acute  superimposed pulmonary process.  3. Similar mild thoracic adenopathy, likely reactive.  4. Age advanced coronary artery atherosclerosis. Recommend  assessment of coronary risk factors and consideration of medical  therapy.  5. Small hiatal hernia.  6. Hepatic steatosis.  REC Have flu shot today  Your breathing test and walk test suggest that your INterstitial Lung disease is slowly progressive compared to nov 2012  I strongly recommend you get VATS procedure lung biopsy by our surgeons  - the rationale for biopsy is diagnostic certainty, therapeutic options and duration and anticipated approval of newer drugs in next few years  - your risk for complications from VATS biopsy is low; they will test for MRSA when you are admitted for biopsy  - You can meet with Drs Morton Peters or B Bartle or Tyrone Sage or [place] for the same  Please call me after meeting with surgeon  Return to see  me after biopsy   OV 09/19/2012  VATS biopsy scheduled 09/28/12 but has questions . She is now here with husband. They are still concerned about indication for VATS lung biopsy and risk benefit ratio.     - She is wondering if this is bird related pneumonia and if she should take doxycycline:  . Says overall feelin great, doing ADLs. Swimming and losing weight. . Per report: walking desaturation test at PMD office was normal and did not go below 90s. However, when I question her more closely she swimming 9 laps and then mild dyspneic. Climbing steps with load esp increasing load or bending over can increase dyspnea. Bending still makes her dsypneic. Reports ongoing issue with GERD that is constant. Hiatal hernia is small. She now recognizes that her GERD increased 3 years ago around the time first time ILD was diagnosed   - Offered 2nd opinion at [facility]: refused  #ILD  Discussed rationale for lung biopsy - still recommend it. 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 However, there is worrying trend of UIP pattern in the Upper lobes.   We discussed her exposures again in more details and they are summarized below. Most worrying is her home environemebt   Home exposures   - Negative for: She denies using feather pillows or using sauna or hot tub. Denies mold on clothes or wet carpets. Denies dish washer leakage. Denies musty smell in house (but this was a problem in 2011-2012) and denies hx of flooding, water damange to ceilings/walls.  No carpets in down floor. Upstairs new carpets and this not wet   - Questionable exposures: House 1938 built.. Has sun room full of plants - occ mushrooms in this but no mold. Does have humidifier because dry air makes her cough but denies it has mold. No mildew in house/bathroom.  However, their basement is old and she says is "Weird" but they do not go there. Duct system they think should be free of mold but not sure.     - The main exposures appear to be the birds for > 20 years which past 1 year no longer exposed to. 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 She remembers musty smell in unused barn and in  [place] house last year which really triggerd her resp issues (sept 2012 admission) and needed prednisone. 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 If epoxy is in your oil paint stop it. Ensure no mold anywhere  - set up overnight oxygen study  - return in 6-8 weeks to start sterid treatment - need weight loss by then and have PFTs at time of followup    OV 11/21/2012 Estimated Body mass index is 27.78 kg/(m^2) as calculated from the following:   Height as of this encounter: 5\' 1" (1.549 m).   Weight as of this encounter: 147 lb(66.679 kg).  Body mass index is 27.78 kg/(m^2).  HP fu to start prednisone  - had home inspection company (pvt [employer]) - no mold in house. Reportedly tested 7 diff areas incluing bath rooms and all cleared . In retrospect, she feels oil painting and birds are cause. Still doing some oil painting. She is active. Has lost weight with low glyucemic diet. She is swimming now. Lot of questions on prednisone. She is off prozac and is worried about mood swings. Her bone density been tested and per hx is normal  Past, Family, Social reviewed: recent GI "norovirus" early to mid December. Has new PMD Dr Orson Ape. Has lost weight with low glycemic diet. TGL is normal now. Plans to see Dr Velta Addison for coronoary atherosclerosis that showed up on CT   Review of Systems  Constitutional: Negative for fever and unexpected weight change.  HENT: Negative for ear pain, nosebleeds, congestion, sore throat, rhinorrhea, sneezing, trouble swallowing, dental problem, postnasal drip and sinus pressure.   Eyes: Negative for redness and itching.  Respiratory: Negative for cough, chest tightness, shortness of breath and  wheezing.   Cardiovascular: Negative for palpitations and leg swelling.  Gastrointestinal: Negative for nausea and vomiting.  Genitourinary: Negative for dysuria.  Musculoskeletal: Negative for joint swelling.  Skin: Negative for rash.  Neurological: Negative for headaches.  Hematological: Does not bruise/bleed easily.  Psychiatric/Behavioral: Negative for dysphoric mood. The patient is not nervous/anxious.        Objective:   Physical Exam Vitals reviewed. Constitutional: She is oriented to person, place, and time. She appears well-developed and well-nourished. No distress.   159# - says 3# weight losss since last OV in Nov 2012 Filed Weights   09/19/12 1434  Weight: 158 lb 6.4 oz (71.85 kg)   11/21/12 Estimated Body mass index is 27.78 kg/(m^2) as calculated from the following:   Height as of this encounter: 5\' 1" (1.549 m).   Weight as of this encounter: 147 lb(66.679 kg).    No murmur heard. Pulmonary/Chest: Effort normal. No respiratory distress. She has no wheezes. She has rales. She exhibits no tenderness.       Rales anteriorly - this is NEW 08/11/2012  None posteriorly   Discussion only visit       Assessment & Plan:

## 2012-11-21 NOTE — Patient Instructions (Addendum)
For Hypersensitivity Pneumonitis  - start prednisone 40mg  daily x 3 weeks, then 30mg  daily x 3 weeks  - return to see me in 5-6 weeks with office spirometry and walk test at followup  - any side effects let me know

## 2012-11-24 ENCOUNTER — Telehealth: Payer: Self-pay | Admitting: Internal Medicine

## 2012-11-24 NOTE — Telephone Encounter (Signed)
Pt is concerned about effects of prednisone on her bones while taking prednisone and she is asking for an rx for fosamax to take while taking the prednisone. Please advise. Rhonda [name], CMA

## 2012-11-27 NOTE — Telephone Encounter (Signed)
She told me that her bone scan was normal. AT a minimum she needs to be on OSCAL-D. REgarding fosfamax she should talk to her PMD even though I have prescribed the prednisone. The pcp MCPHERSON,BARBARA, MD (? This is correct pcp) shpould weight the pro and con.   Please let patient know. If you confirm this is correct PCP (she is in midst of change) I will talk to PCP directly as well upon return  Please let me know  Thanks MR

## 2012-11-28 NOTE — Telephone Encounter (Signed)
Called, spoke with pt.  I informed her of below per MR.  She verbalized understanding of this and states she looked at the bone scan results again.  Per pt, it states there is thinning in one of her hips.  Pt states she will call Dr. Audria Nine, who is still her PCP, to have her weigh the pros and cons of fosamax and rx if necessary.  She is aware MR will also call Dr. Audria Nine once he returns from vacation.  She verbalized understanding and voiced no further questions or concerns at this time.  Will route to MR as a reminder to call Dr. Audria Nine.

## 2012-12-01 ENCOUNTER — Telehealth: Payer: Self-pay

## 2012-12-01 NOTE — Telephone Encounter (Signed)
MR requested I call Dr. Audria Nine and LM to call his cell. I called today and she is out sick, so I LM with her nurse to have her call on her return. Carron Curie, CMA

## 2012-12-01 NOTE — Telephone Encounter (Signed)
DR RAMASWAMY FROM  WOULD LIKE DR MCPHERSON TO CALL HIM ON HIS CELL AT [phone] AND HE UNDERSTANDS SHE ISN'T IN THE OFFICE TODAY, BUT SAID WHENEVER SHE COMES BACK TO CALL

## 2012-12-06 ENCOUNTER — Telehealth: Payer: Self-pay | Admitting: Internal Medicine

## 2012-12-06 NOTE — Telephone Encounter (Signed)
Error.Rhonda [name] ° °

## 2012-12-07 ENCOUNTER — Telehealth: Payer: Self-pay | Admitting: Family Medicine

## 2012-12-07 DIAGNOSIS — Z13820 Encounter for screening for osteoporosis: Secondary | ICD-10-CM

## 2012-12-07 NOTE — Telephone Encounter (Signed)
I spoke w/ Dr. Marchelle Gearing (PULM) re: tx w/ Prednisone. He wanted to know if pt had had bone density study; I could not find record test. Pt needs to have this done soon; if she has evidence of bone loss, then she will need treatment with one of the agents used to treat osteoporosis.  I will place an order for DEXA and proceed with tx for osteoporosis if warranted. If DEXA is normal, will advise adequate calcium/Vit D and strengthening exercises. Pt may need DEXA after tx with Prednisone (Dr. Elvera Lennox. States it will be 3-6 months of steroid for her lung disease).  I called pt to let her know about discussion w/ PULM and DEXA to be ordered. Last one she had in 2002 at [facility] was normal. She is taking extra calcium + D and feels much better already since starting Prednisone. Hx of GERD may prohibit tx w/ Fosamax.

## 2012-12-08 NOTE — Assessment & Plan Note (Signed)
She has lost weight. She is doing well on low glycemic diet. She has had home examined and reportedly free of possible mold exposure. DLCO is 51% and will form baseline for pre-prednisone. We discussed prednisone extensively especially various side effects. Anticipated duraton is < 3months but could be longer as long as 9months.The main reason for prednisone is to get rid of acute inflammation. She is likely to have residual permanent ILD due to UIP pattern.   > 50% of this 15 min visit spent in face to face counseling

## 2012-12-09 ENCOUNTER — Telehealth: Payer: Self-pay | Admitting: Radiology

## 2012-12-09 NOTE — Telephone Encounter (Signed)
Thanks, patient is post menopausal and is going to be treated with extended course of prednisone, can this be used? Please advise and we will correct. Ibrahim Mcpheeters

## 2012-12-09 NOTE — Telephone Encounter (Signed)
D.w Dr Audria Nine 2 days ago, She will call patient and discuss. I explained 3 months prednisone hopefully, worset case 66mo

## 2012-12-09 NOTE — Telephone Encounter (Signed)
Message copied by Caffie Damme on Fri Dec 09, 2012 12:12 PM ------      Message from: Kenard Gower      Created: Fri Dec 09, 2012 10:21 AM      Regarding: EPIC ORDER       We received an order for a bone density on this patient with a diagnosis of screening for osteoporosis. Insurance will not pay for this diagnosis. Please change to menopausal and postmenopausal or a specific diagnosis. She has to already be diagnosed with osteoporosis in order to use that.                  Thanks       Scheduling

## 2012-12-12 ENCOUNTER — Telehealth: Payer: Self-pay

## 2012-12-12 NOTE — Telephone Encounter (Signed)
Pt left vmail about bone density scan. There is a delay in the order due to medication as documented by Amy Littrell. Last communication states we are waiting to hear back from the scheduler about prednisone before scheduling. lmom for pt to call back about this information.   bf

## 2012-12-16 ENCOUNTER — Ambulatory Visit (INDEPENDENT_AMBULATORY_CARE_PROVIDER_SITE_OTHER): Payer: BC Managed Care – PPO | Admitting: Nurse Practitioner

## 2012-12-16 ENCOUNTER — Encounter: Payer: Self-pay | Admitting: Nurse Practitioner

## 2012-12-16 ENCOUNTER — Institutional Professional Consult (permissible substitution): Payer: BC Managed Care – PPO | Admitting: Cardiology

## 2012-12-16 VITALS — BP 118/66 | HR 54 | Ht 61.0 in | Wt 143.8 lb

## 2012-12-16 DIAGNOSIS — R0600 Dyspnea, unspecified: Secondary | ICD-10-CM

## 2012-12-16 DIAGNOSIS — R0989 Other specified symptoms and signs involving the circulatory and respiratory systems: Secondary | ICD-10-CM

## 2012-12-16 DIAGNOSIS — R0609 Other forms of dyspnea: Secondary | ICD-10-CM

## 2012-12-16 DIAGNOSIS — E785 Hyperlipidemia, unspecified: Secondary | ICD-10-CM

## 2012-12-16 NOTE — Progress Notes (Signed)
Patient Name: Rhonda [name] Date of Encounter: 12/16/2012  Primary Care Provider:  Dow Adolph, MD Primary Cardiologist:  T. Wall, MD  Patient Profile  62 y/o female with h/o non-cardiac dyspnea, who presents for f/u.  Problem List   Past Medical History  Diagnosis Date  . High cholesterol   . History of chronic bronchitis     as child  . GERD (gastroesophageal reflux disease)   . Arthritis   . Depression   . Allergy   . Asthma   . Cataract   . Pneumonitis, hypersensitivity     a. 09/2012 s/p Bx - ? 2/2 bird, mold, oil paint exposure ->on steroids, followed by pulm.  . Rapid heart rate   . DOE (dyspnea on exertion)     a. 04/2010 Lexi MV EF 71%, no ischemia/infarct;     Past Surgical History  Procedure Date  . Tubal ligation   . Foot surgery 2006 or 2007    right  . Video bronchoscopy 11/19/2011    Procedure: VIDEO BRONCHOSCOPY WITH FLUORO;  Surgeon: Kalman Shan, MD;  Location: [facility] ENDOSCOPY;  Service: Endoscopy;;  . Hymenectomy   . Cesarean section   . Fracture surgery   . Video assisted thoracoscopy 09/28/2012    Procedure: VIDEO ASSISTED THORACOSCOPY;  Surgeon: Loreli Slot, MD;  Location: [facility] OR;  Service: Thoracic;  Laterality: Right;  . Lung biopsy 09/28/2012    Procedure: LUNG BIOPSY;  Surgeon: Loreli Slot, MD;  Location: [facility] OR;  Service: Thoracic;  Laterality: N/A;  lung biopsies tims three  . Breast surgery 2009    BIOPSY    Allergies  Allergies  Allergen Reactions  . Atorvastatin Other (See Comments)    Leg pain  . Betadine (Povidone Iodine) Other (See Comments)    blisters  . Codeine Nausea And Vomiting  . Rosuvastatin Other (See Comments)    Leg pain  . Shellfish Allergy Other (See Comments)    vomiting  . Sulfonamide Derivatives Other (See Comments)    headaches    HPI  62 y/o female with the above problem list.  She has recently undergone extensive pulmonology evaluation for ongoing dyspnea and presumed ILD  with subsequent biopsy and finding of hypersensitivity pneumonitis.  She was started on steroids at the end of December and has since been doing very well.  She has lost roughly 20 lbs over the past year.  She has questions today regarding her need for ongoing statin therapy and a finding of coronary atherosclerosis on a non-contrast CT performed in the fall.  She denies chest pain, palpitations, pnd, orthopnea, n, v, dizziness, syncope, edema, weight gain, or early satiety.  Home Medications  Prior to Admission medications   Medication Sig Start Date End Date Taking? Authorizing Provider  acetaminophen (TYLENOL) 500 MG tablet Take 500 mg by mouth every 6 (six) hours as needed.   Yes Historical Provider, MD  calcium carbonate (TUMS EX) 750 MG chewable tablet Chew 2 tablets by mouth daily.   Yes Historical Provider, MD  metoprolol succinate (TOPROL-XL) 50 MG 24 hr tablet Take 1 tablet (50 mg total) by mouth daily. Take with or immediately following a meal. 10/28/12  Yes Maurice March, MD  naproxen sodium (ANAPROX) 220 MG tablet Take 220 mg by mouth 2 (two) times daily as needed.   Yes Historical Provider, MD  Omega-3 Fatty Acids (FISH OIL) 1000 MG CAPS Take 1,000 mg by mouth daily.   Yes Historical Provider, MD  omeprazole (PRILOSEC)  20 MG capsule Take 20 mg by mouth daily. 09/13/12  Yes Eleanore E Debbra Riding, PA-C  pravastatin (PRAVACHOL) 40 MG tablet Take 1 tablet (40 mg total) by mouth daily. 10/28/12  Yes Maurice March, MD  predniSONE (DELTASONE) 10 MG tablet Take 4 tabs po x 3 weeks, then 3 tabs po x 3 weeks and STOP. 11/21/12  Yes Kalman Shan, MD  levocetirizine (XYZAL) 5 MG tablet Take 5 mg by mouth as needed. Takes during allergy season 09/13/12   Godfrey Pick, PA-C  ondansetron (ZOFRAN-ODT) 4 MG disintegrating tablet Take 1 tablet (4 mg total) by mouth every 8 (eight) hours as needed. (For nausea). 10/28/12   Maurice March, MD    Review of Systems  Breathing much  improved.  She denies chest pain, palpitations, pnd, orthopnea, n, v, dizziness, syncope, edema, weight gain, or early satiety. All other systems reviewed and are otherwise negative except as noted above.  Physical Exam  Blood pressure 118/66, pulse 54, height 5\' 1"  (1.549 m), weight 143 lb 12.8 oz (65.227 kg).  General: Pleasant, NAD Psych: Normal affect. Neuro: Alert and oriented X 3. Moves all extremities spontaneously. HEENT: Normal  Neck: Supple without bruits or JVD. Lungs:  Resp regular and unlabored, CTA. Heart: RRR no s3, s4, or murmurs. Abdomen: Soft, non-tender, non-distended, BS + x 4.  Extremities: No clubbing, cyanosis or edema. DP/PT/Radials 2+ and equal bilaterally.  Accessory Clinical Findings  ECG - sb, 54, no acute st/t changes.  Assessment & Plan  1.  Dyspnea:  This has improved markedly since being evaluated by pulm/TCTS with lung bx and diagnosis of hypersensitivity pneumonitis and steroid treatment.  2.  Hyperlipidemia:  Last lipid profile in December was favorable with an LDL of 56 - on pravachol 40.  This likely also reflects her weight loss.  She is interested in reducing her statin dosage and/or coming off of a statin altogether.  She tolerates pravachol well and has been on it for several years, after not tolerating other statins.  We discussed the latest lipid mgmt guidelines and their application to her specifically.  She is not diabetic and never had an LDL > 190.  She does have coronary calcification noted on non-contrast CT 9/24 with negative myoview in 2011.  She plans to cut back her pravachol to 1/2 tablet daily rather than stop it altogether.  3.  Dispo:  F/u prn.  Nicolasa Ducking, NP 12/16/2012, 3:40 PM

## 2012-12-16 NOTE — Patient Instructions (Addendum)
Your physician recommends that you schedule a follow-up appointment as needed  

## 2012-12-23 ENCOUNTER — Encounter: Payer: Self-pay | Admitting: Internal Medicine

## 2013-01-02 ENCOUNTER — Ambulatory Visit (INDEPENDENT_AMBULATORY_CARE_PROVIDER_SITE_OTHER): Payer: BC Managed Care – PPO | Admitting: Internal Medicine

## 2013-01-02 ENCOUNTER — Encounter: Payer: Self-pay | Admitting: Internal Medicine

## 2013-01-02 VITALS — BP 122/70 | HR 53 | Temp 98.0°F | Ht 61.0 in | Wt 145.4 lb

## 2013-01-02 DIAGNOSIS — Z79899 Other long term (current) drug therapy: Secondary | ICD-10-CM

## 2013-01-02 DIAGNOSIS — R000 Tachycardia, unspecified: Secondary | ICD-10-CM

## 2013-01-02 DIAGNOSIS — E8881 Metabolic syndrome: Secondary | ICD-10-CM

## 2013-01-02 DIAGNOSIS — J679 Hypersensitivity pneumonitis due to unspecified organic dust: Secondary | ICD-10-CM

## 2013-01-02 DIAGNOSIS — R0602 Shortness of breath: Secondary | ICD-10-CM

## 2013-01-02 MED ORDER — PREDNISONE 10 MG PO TABS: ORAL_TABLET | ORAL | Status: DC

## 2013-01-02 NOTE — Progress Notes (Signed)
Subjective:    Patient ID: Rhonda [name], female    DOB: 05/31/1951, 62 y.o.   MRN: [id number]  HPI  #GE reflux with small hiatal hernia  - on ppi  #MEtabolic Syndrome  - On low glycemic diet  159#  on 09/19/12 147# on 11/21/12 145# on 01/02/13   #History of rapid heart rate not otherwise specified  - Start her on Lopressor  2008/2009 by primary care physician. History appears to correlate with onset of respiratory issues  # Hypersensitivity Pneumonitis  - Potential etiologies - cockateil x 2 for 18 years through end 2012. 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 ACE level normal. HP panel -negative  -  Uderwennt bronch 11/19/11  - non diagnostic  - VATS Nov 2013 (done after initially refusing and strongly counseling her to undergo VATS) - CRONIC HYPERSENSITIVITY PNEUMONITIS WITH GRANULOMA. However, there is worrying trend of UIP pattern in the Upper lobes.    #Exposure Hx (retake Nov 2013)       Home exposures   - Negative for: She denies using feather pillows or using sauna or hot tub. Denies mold on clothes or wet carpets. Denies dish washer leakage. Denies musty smell in house (but this was a problem in 2011-2012) and denies hx of flooding, water damange to ceilings/walls.  No carpets in down floor. Upstairs new carpets and this not wet   - Questionable exposures: House 1936 built.. Has sun room full of plants - occ mushrooms in this but no mold. Does have humidifier because dry air makes her cough but denies it has mold. No mildew in house/bathroom.  However, their basement is old and she says is "Weird" but they do not go there. Duct system they think should be free of mold but not sure.     - The main exposures appear to be the birds for > 20 years which past  1 year no longer exposed to. 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 She remembers musty smell in unused barn and in  [place] house last year which really triggerd her resp issues (sept 2012 admission) and needed prednisone. 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 This is documented in vital signs. The diet is definitely making her energetic,. She no longer has irritable bowel she is better sleep  quality and does not have postprandial sleepiness. She saw cardiology and she's been advised to come down on a statin dose  - Hypersensitivity pneumonitis   - She is now on prednisone 30 mg once a day for the past 2-3 weeks. She reports improved dyspnea compared to baseline and 3 prednisone. However when she was at 40 mg she had the best improvement in dyspnea. This is somewhat diminished at 30 mg which is her current prednisone dose. Despite this, her overall dyspnea is much improved. Her effort tolerance of the gym while swimming in terms of breath hold and laps swum is significantly better. Although more most recently when she  climbed uphill she did notice some dyspnea. Of note, she is on metoprolol for a history of "rapid heart rate". Today resting heart rate was 40 per minute. Spirometry today show stability compared to pre-prednisone in September 2013. Walk test today   -Walking desaturation test on 01/02/2013 185 feet x 3 laps:  did  NOT desaturate. Rest pulse ox was 96%, final pulse ox was 95%. HR response 56/min at rest to 75/min at peak exertion. He    Oct/Nov 2012  March 2013 08/11/12  Nov/Dec 2013 01/02/2013   Symp/Signs Dyspnea x5y  New crackles  dimished crackles  Fev1  1.9 L   1.7L/90%  1.66 L/87%   1.66 L/78%   FVC  2.6 L    2.2 L/82%   2.1 L/80%   2.19 L/82%   Ratio     79   76/95%   fef 25-75%       TLC  4.3 L    3.3 L/79%  3.5 L/82%    DLCO  10/50%    10/51%   9.6/51%    Walk test 185 ft x 3 laps on RA  rest 92%. Pk exertion 90%. Pk HR 108 Rest 92%. Pk exertion 86% at 2.5 laps. Pk HR 102  pk exertion - 88% Rest 96%. Pk 95%. Pk HR is 75  CT chest   unchanged    Tests/Bx Bronch 0- non diagnostic   VATS - HP with UIP in Upper lobe   RX    Rx pred start 11/23/12     - Prednisone side effects  - Currently none. Mood is better and more positive. She is off the Prozac. She is having some vision problems in the left eye and I recommended she talk with her ophthalmologist. . She's trying to get a bone scan done  Review of Systems  Constitutional: Negative for fever and unexpected weight change.  HENT: Negative for ear pain, nosebleeds, congestion, sore throat, rhinorrhea, sneezing, trouble swallowing, dental problem, postnasal drip and sinus pressure.   Eyes: Negative for redness and itching.  Respiratory: Negative for cough, chest tightness, shortness of breath and wheezing.   Cardiovascular: Negative for palpitations and leg swelling.  Gastrointestinal: Negative for nausea and vomiting.  Genitourinary: Negative for dysuria.  Musculoskeletal: Negative for joint swelling.  Skin: Negative for rash.   Neurological: Negative for headaches.  Hematological: Does not bruise/bleed easily.  Psychiatric/Behavioral: Negative for dysphoric mood. The patient is not nervous/anxious.        Objective:   Physical Exam  Vitals reviewed. Constitutional: She is oriented to person, place, and time. She appears well-developed and well-nourished. No distress.  Body mass index is 27.49 kg/(m^2). Has lost significant amount of weight intentionally  HENT:  Head: Normocephalic and atraumatic.  Right Ear: External ear normal.  Left Ear: External ear normal.  Mouth/Throat: Oropharynx is clear and moist. No oropharyngeal exudate.  Eyes: Conjunctivae and EOM are normal. Pupils are equal, round, and reactive to light. Right eye exhibits no discharge. Left eye exhibits no discharge. No scleral icterus.  Neck: Normal range of motion. Neck supple. No JVD present. No tracheal deviation present. No thyromegaly present.  Cardiovascular: Normal rate, regular rhythm, normal heart sounds and intact distal pulses.  Exam reveals no gallop and no friction rub.   No murmur heard. Pulmonary/Chest: Effort normal. No respiratory distress. She has no wheezes. She has rales. She exhibits no tenderness.  I can hardly hear rales; significantly diminished  Abdominal: Soft. Bowel sounds are normal. She exhibits no distension and no mass. There is no tenderness. There is no rebound and no guarding.  Musculoskeletal: Normal range of motion. She exhibits no edema and no tenderness.  Lymphadenopathy:    She has no cervical adenopathy.  Neurological: She is alert and oriented to person, place, and time. She has normal reflexes. No cranial nerve deficit. She exhibits normal muscle tone. Coordination normal.  Skin: Skin is warm and dry. No rash noted. She is not diaphoretic. No erythema. No pallor.  Psychiatric: She has a normal mood and affect. Her behavior is normal. Judgment and thought content normal.         Assessment & Plan:

## 2013-01-02 NOTE — Patient Instructions (Addendum)
#  Hypersensitivity pneumonitis - I think you're better based on his symptoms and walk test and stability and sp -irometry  - Continue prednisone but cut down to  20mg  once daily  #Prednisone therapy  - For left eye issues see Dr. Ophthalmology - I will retrieve  paper chart from our system to see if you have had a bone scan in our system  #Metabolic syndrome  - Continue weight loss, yoga and exercise and low glycemic diet - Focus on weight and weight before focusing on cholesterol numbers  #Rapid heart rate - I'm not sure why you had this problem might have been related to your respiratory issues - Cut the metoprolol to 25 mg once daily and monitor heart rate at home. His rapid heart rate is a problem in the future we can refer you back to cardiology   #Followup - 6 weeks with sprometry and walk test at followup

## 2013-01-03 DIAGNOSIS — Z79899 Other long term (current) drug therapy: Secondary | ICD-10-CM | POA: Insufficient documentation

## 2013-01-03 NOTE — Assessment & Plan Note (Signed)
    Oct/Nov 2012  March 2013 08/11/12  Nov/Dec 2013 01/02/2013   Symp/Signs Dyspnea x5y  New crackles  dimished crackles  Fev1  1.9 L   1.7L/90%  1.66 L/87%   1.66 L/78%   FVC  2.6 L    2.2 L/82%   2.1 L/80%   2.19 L/82%   Ratio     79   76/95%   fef 25-75%       TLC  4.3 L    3.3 L/79%  3.5 L/82%    DLCO  10/50%    10/51%   9.6/51%    Walk test 185 ft x 3 laps on RA  rest 92%. Pk exertion 90%. Pk HR 108 Rest 92%. Pk exertion 86% at 2.5 laps. Pk HR 102  pk exertion - 88% Rest 96%. Pk 95%. Pk HR is 75  CT chest   unchanged    Tests/Bx Bronch 0- non diagnostic   VATS - HP with UIP in Upper lobe   RX    Rx pred start 11/23/12     She has shown dramatic improvement with prednisone in terms of her subjective dyspnea, objective decrease and crackles on physical exam an improvement in oxygenation exertion. She has some subjective worsening dyspnea on the lower dose of prednisone which is 30 mg per day but it appears that this dose is better for her in terms of prednisone side effects especially with mood. In any event, good improvement I will cut her prednisone down to 20 mg per day and see her in 4-6 weeks with spirometry and walk test at followup

## 2013-01-03 NOTE — Assessment & Plan Note (Signed)
I have asked her to see her ophthalmologist for her left eye issues to determine if this is due to prednisone I reviewed medical imaging in our PACS system since 2001 and there is no evidence of her having had bone scan. She will communicate this with her primary care physician

## 2013-01-03 NOTE — Assessment & Plan Note (Signed)
Congratulated her on continued and persistent and determined weight loss. I've encouraged her to stay the face and course with her low glycemic diet. She agrees

## 2013-01-03 NOTE — Assessment & Plan Note (Signed)
She gives this history of tachycardia that was diagnosed not otherwise specified by her primary care physician several years ago at the time of onset of respiratory issues. She was recently seen by cardiology and discharged but they have never address the issue of her tachycardia. Clinically this appears to be sinus tachycardia. I also note that prior to prednisone but submaximal walking exertion in the office her heart rate would rise up to 100 and above but now with prednisone and improvement in hypoxemia the heart rate does not go above 90. Therefore I feel that the tachycardia was related to hypoxemia. Therefore I have asked her to cut the Lopressor down by half. We will continue to monitor this she will monitor this with a pulse ox at home. Aim to slowly wean her off the metoprolol. If tachycardia proves to be a problem will refer back to cardiology

## 2013-02-07 ENCOUNTER — Encounter: Payer: Self-pay | Admitting: Family Medicine

## 2013-02-28 ENCOUNTER — Ambulatory Visit (INDEPENDENT_AMBULATORY_CARE_PROVIDER_SITE_OTHER)
Admission: RE | Admit: 2013-02-28 | Discharge: 2013-02-28 | Disposition: A | Discharge status: Home / Self Care | Payer: BC Managed Care – PPO | Source: Ambulatory Visit | Attending: Internal Medicine | Admitting: Internal Medicine

## 2013-02-28 ENCOUNTER — Encounter: Payer: Self-pay | Admitting: Internal Medicine

## 2013-02-28 ENCOUNTER — Ambulatory Visit (INDEPENDENT_AMBULATORY_CARE_PROVIDER_SITE_OTHER): Payer: BC Managed Care – PPO | Admitting: Internal Medicine

## 2013-02-28 ENCOUNTER — Telehealth: Payer: Self-pay | Admitting: Internal Medicine

## 2013-02-28 VITALS — BP 104/64 | HR 92 | Temp 98.7°F | Ht 60.0 in | Wt 145.0 lb

## 2013-02-28 DIAGNOSIS — R079 Chest pain, unspecified: Secondary | ICD-10-CM

## 2013-02-28 DIAGNOSIS — R000 Tachycardia, unspecified: Secondary | ICD-10-CM

## 2013-02-28 DIAGNOSIS — J679 Hypersensitivity pneumonitis due to unspecified organic dust: Secondary | ICD-10-CM

## 2013-02-28 DIAGNOSIS — J849 Interstitial pulmonary disease, unspecified: Secondary | ICD-10-CM

## 2013-02-28 DIAGNOSIS — J8410 Pulmonary fibrosis, unspecified: Secondary | ICD-10-CM

## 2013-02-28 NOTE — Telephone Encounter (Signed)
Please tell her that I cannot see anything on cxr that can explain her pleuritic pain she has had. So, I am talking to thoracic radiologist for an opinion. Based on this might need her to do PFT or CT chest; not sure. For now continue prednisone at 20mg  per day; she will need refill sent  Thanks  MR

## 2013-02-28 NOTE — Progress Notes (Signed)
Subjective:    Patient ID: Rhonda [name], female    DOB: 1951/07/10, 62 y.o.   MRN: [id number]  HPI   Subjective:    Patient ID: Rhonda [name], female    DOB: [birth year], 62 y.o.   MRN: [id number]  HPI  #GE reflux with small hiatal hernia  - on ppi  #MEtabolic Syndrome  - On low glycemic diet  159#  on 09/19/12 147# on 11/21/12 145# on 01/02/13 145# on 02/28/2013     #History of rapid heart rate not otherwise specified  - Start her on Lopressor  2008/2009 by primary care physician. History appears to correlate with onset of respiratory issues  # Hypersensitivity Pneumonitis  - Potential etiologies - cockateil x 2 for 18 years through end 2012. 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 However, there is worrying trend of UIP pattern in the Upper lobes.    #Exposure Hx (retake Nov 2013)       Home exposures .: Negative for: She denies using feather pillows or using sauna or hot tub. Denies mold on clothes or wet carpets. Denies dish washer leakage. Denies musty smell in house (but this was a problem in 2011-2012) and denies hx of flooding, water damange to ceilings/walls.  No carpets in down floor. Upstairs new carpets and this not wet. Questionable exposures: House 1936 built.. Has sun room full of plants - occ mushrooms in this but no mold. Does have humidifier because dry air makes her cough but denies it has mold. No mildew in house/bathroom.  However, their basement is old and she says is "Weird" but they do not go there. Duct system they think should be free of mold but not sure. Positive expo:   The main exposures appear to  be the birds for > 20 years which past 1 year no longer exposed to. 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 She remembers musty smell in unused barn and in  [place] house last year which really triggerd her resp issues (sept 2012 admission) and needed prednisone. She does do lot of potting and work with [employer]    Drug hx:  Drug hx  She was on nitrafurantoin 5 years ago on regular basis but not in past 5 years   Occupational hx: negative for vet work, Scientist, forensic, grain or flour processing, Retail buyer work, Press photographer work and Therapist, sports and dust exposure work and Administrator, sports, Network engineer. 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 She finds that her effort tolerance is stable versus improved compared to 01/02/2013. She feels she can take deeper breaths worsening and last longer. However climbing hills still problematic. She feels this is due to her tachycardia. She says that since she cut down  the Lopressor to half her normal dose at the time of last visit in February 2014 her tachycardia is worse particularly at night. She was to go back on full dose Lopressor. She is reluctant to see the cardiologist about this.  She continues to be concerned about the long-term effects of prednisone. She says her height now measures 4 feet 11 inches which is 2 inches shorter than her baseline. She has not had a bone scan there been some issues with with getting this. She therefore she is on alendronate  Of note, her main concern is a few weeks ago she woke up in the middle of the night with acute right pleuritic chest pain  that was severe but improved with walking the same thing was repeated last night. Other than this there no other symptoms. There is no associated hemoptysis, edema, fever, sputum, cough. She's wondering if this is related to rib fracture or muscle pull of vertebral fracture.    Oct/Nov 2012  March 2013 08/11/12  Nov/Dec 2013 01/02/2013  02/28/2013   Symp/Signs Dyspnea x5y  New crackles  dimished dyspnea except at hill. Improved crackles.  Improved dyspnea except @ hill  Fev1  1.9 L   1.7L/90%  1.66 L/87%   1.66 L/78%  1.75L/90%  FVC  2.6 L    2.2 L/82%   2.1 L/80%   2.19 L/82%  2.2L/91%  Ratio     79   76/95%  80/99%  fef 25-75%        TLC  4.3 L    3.3 L/79%  3.5 L/82%     DLCO  10/50%    10/51%   9.6/51%     Walk test 185 ft x 3 laps on RA  rest 92%. Pk exertion 90%. Pk HR 108 Rest 92%. Pk exertion 86% at 2.5 laps. Pk HR 102  pk exertion - 88% Rest 96%. Pk 95%. Pk HR is 75. Done at full dose lopressor Rest 94%. Pk 91%. Rest HR 93 with pk HR 103/min. Done at half dose lopressor  CT chest   unchanged     Tests/Bx Bronch 0- non diagnostic   VATS - HP with UIP in Upper lobe    RX    Rx pred start 11/23/12 On  pred 30mg  per day and will reduce to 20mg  On Pred 20mg  per day          Assessment & Plan:    Review of Systems  Constitutional: Negative for fever and unexpected weight change.  HENT: Positive for postnasal drip. Negative for ear pain, nosebleeds, congestion, sore throat, rhinorrhea, sneezing, trouble swallowing, dental problem and sinus pressure.   Eyes: Negative for redness and itching.  Respiratory: Positive for cough, shortness of breath and wheezing. Negative for chest tightness.   Cardiovascular: Negative for palpitations and leg swelling.  Gastrointestinal: Positive for nausea. Negative for vomiting.  Genitourinary: Negative for dysuria.  Musculoskeletal: Negative for joint swelling.  Skin: Negative for rash.  Neurological: Positive for headaches.  Hematological: Does not  bruise/bleed easily.  Psychiatric/Behavioral: Negative for dysphoric mood. The patient is not nervous/anxious.        Objective:   Physical Exam  Vitals reviewed. Constitutional: She is oriented to person, place, and time. She appears well-developed and well-nourished. No distress.  Body mass index is 27.49 kg/(m^2). - Has lost significant amount of weight intentionally - 01/02/13 Body mass index is 29.27 kg/(m^2). on 02/28/2013   HENT:  Head: Normocephalic and atraumatic.  Right Ear:  External ear normal.  Left Ear: External ear normal.  Mouth/Throat: Oropharynx is clear and moist. No oropharyngeal exudate.  Eyes: Conjunctivae and EOM are normal. Pupils are equal, round, and reactive to light. Right eye exhibits no discharge. Left eye exhibits no discharge. No scleral icterus.  Neck: Normal range of motion. Neck supple. No JVD present. No tracheal deviation present. No thyromegaly present.  Cardiovascular: Normal rate, regular rhythm, normal heart sounds and intact distal pulses.  Exam reveals no gallop and no friction rub.   No murmur heard. Pulmonary/Chest: Effort normal. No respiratory distress. She has no wheezes. She has rales. She exhibits no tenderness.  Rales definitely diminshed from pre-prednisone but ? Worse or same compared to feb 2014 Abdominal: Soft. Bowel sounds are normal. She exhibits no distension and no mass. There is no tenderness. There is no rebound and no guarding.  Musculoskeletal: Normal range of motion. She exhibits no edema and no tenderness.  Lymphadenopathy:    She has no cervical adenopathy.  Neurological: She is alert and oriented to person, place, and time. She has normal reflexes. No cranial nerve deficit. She exhibits normal muscle tone. Coordination normal.  Skin: Skin is warm and dry. No rash noted. She is not diaphoretic. No erythema. No pallor.  Psychiatric: She has a normal mood and affect. Her behavior is normal. Judgment and thought content  normal      Assessment & Plan:

## 2013-02-28 NOTE — Assessment & Plan Note (Signed)
Subjectively her tachycardia is worse. She wants to increase her Lopressor and she wants to hold off on cardiology evaluation for this. I've advised her to increase her Lopressor and we will monitor this clinically and referred to cardiology if things worsen

## 2013-02-28 NOTE — Patient Instructions (Addendum)
Please have CXR Today Will call you with results to decide on cutting prednisone down Can Increase lopressor to your original dose Followup depending on cxr

## 2013-02-28 NOTE — Assessment & Plan Note (Signed)
I am not sure what is causing her pleuritic chest pain. This is puzzling given subjective improvement, slight improvement in spirometry FEV1 and stability in walking desaturation test I did a chest x-ray today but this is nonrevealing. I will check with thoracic radiology Dr Leanna Battles for second opinion. Depending on this I might get a pulmonary function test and CT scan of the chest. For now I have instructed my office staff to communicate with the patient to continue prednisone at 20 mg per day

## 2013-03-01 ENCOUNTER — Telehealth: Payer: Self-pay | Admitting: Internal Medicine

## 2013-03-01 DIAGNOSIS — R0602 Shortness of breath: Secondary | ICD-10-CM

## 2013-03-01 MED ORDER — PREDNISONE 10 MG PO TABS: ORAL_TABLET | ORAL | Status: DC

## 2013-03-01 MED ORDER — LEVOCETIRIZINE DIHYDROCHLORIDE 5 MG PO TABS: 5.0000 mg | ORAL_TABLET | ORAL | Status: DC | PRN

## 2013-03-01 NOTE — Telephone Encounter (Signed)
lmomtcb x1 for pt. rx has been sent. Will need to call [facility] to scheduled PFT in the AM as they have gone already

## 2013-03-01 NOTE — Telephone Encounter (Signed)
lmomtcb x1 for pt. Will forward ti Jenn as triage has already tried to reach pt

## 2013-03-01 NOTE — Telephone Encounter (Signed)
I spoke with patient about results and she verbalized understanding and had no questions. Pt is also wanting to know if we can refill her levocetrizine. She is going out of town tomorrow. We have never refilled this for pt. Please advise MR thanks

## 2013-03-01 NOTE — Telephone Encounter (Signed)
I spoke with patient about results and she verbalized understanding and had no questions. rx has been sent

## 2013-03-01 NOTE — Telephone Encounter (Signed)
Please tell her radiologist does not see anyting worse on CXR. Nevertheless I am concerned about her pain. So, she should get full PFT first (do asap < 5-7 days if possible, any location). I want to see that before I reduce her prednisone further. Tell them to tell tech to leave it on my desk   Also, for pain watch for now. IF comes back, will need CT chest  Ok to send levocetrizine   Dr. Kalman Shan, M.D., [facility].C.P Pulmonary and Critical Care Medicine Staff Physician  [facility] [place] [facility] Pager: [phone], If no answer or between  15:00h - 7:00h: call [phone]  03/01/2013 3:56 PM

## 2013-03-02 NOTE — Telephone Encounter (Signed)
I spoke with Marcelino Duster and first PFT available for 03-13-13 at 1pm. Pt is aware. I mailed PFT instructions to pt. Carron Curie, CMA

## 2013-03-02 NOTE — Telephone Encounter (Signed)
Pt is aware. She is going out of town so she states PFT needs to be scheduled for 03-08-13 or after. I LMTCBx1 with Marcelino Duster at [facility] to set PFT there. Carron Curie, CMA

## 2013-03-03 ENCOUNTER — Ambulatory Visit: Payer: BC Managed Care – PPO | Admitting: Internal Medicine

## 2013-03-09 ENCOUNTER — Encounter: Payer: Self-pay | Admitting: Family Medicine

## 2013-03-09 ENCOUNTER — Ambulatory Visit (INDEPENDENT_AMBULATORY_CARE_PROVIDER_SITE_OTHER): Payer: BC Managed Care – PPO | Admitting: Family Medicine

## 2013-03-09 VITALS — BP 118/74 | HR 71 | Temp 98.1°F | Resp 16 | Ht 60.0 in | Wt 144.0 lb

## 2013-03-09 DIAGNOSIS — E785 Hyperlipidemia, unspecified: Secondary | ICD-10-CM

## 2013-03-09 DIAGNOSIS — K645 Perianal venous thrombosis: Secondary | ICD-10-CM

## 2013-03-09 DIAGNOSIS — R7309 Other abnormal glucose: Secondary | ICD-10-CM

## 2013-03-09 DIAGNOSIS — K625 Hemorrhage of anus and rectum: Secondary | ICD-10-CM

## 2013-03-09 DIAGNOSIS — R739 Hyperglycemia, unspecified: Secondary | ICD-10-CM

## 2013-03-09 LAB — LIPID PANEL
LDL Cholesterol: 128 mg/dL — ABNORMAL HIGH (ref 0–99)
VLDL: 29 mg/dL (ref 0–40)

## 2013-03-09 LAB — BASIC METABOLIC PANEL
CO2: 28 mEq/L (ref 19–32)
Calcium: 9.7 mg/dL (ref 8.4–10.5)
Chloride: 101 mEq/L (ref 96–112)
Sodium: 139 mEq/L (ref 135–145)

## 2013-03-09 MED ORDER — METOPROLOL SUCCINATE ER 25 MG PO TB24: 25.0000 mg | ORAL_TABLET | Freq: Every day | ORAL | Status: DC

## 2013-03-09 MED ORDER — ALENDRONATE SODIUM 70 MG PO TABS: 70.0000 mg | ORAL_TABLET | ORAL | Status: DC

## 2013-03-09 MED ORDER — FLUTICASONE PROPIONATE 50 MCG/ACT NA SUSP: 2.0000 | Freq: Two times a day (BID) | NASAL | Status: DC

## 2013-03-09 MED ORDER — PRAVASTATIN SODIUM 40 MG PO TABS: 20.0000 mg | ORAL_TABLET | Freq: Every day | ORAL | Status: DC

## 2013-03-09 MED ORDER — LEVOCETIRIZINE DIHYDROCHLORIDE 5 MG PO TABS: 5.0000 mg | ORAL_TABLET | ORAL | Status: DC | PRN

## 2013-03-09 MED ORDER — OMEPRAZOLE 20 MG PO CPDR: 20.0000 mg | DELAYED_RELEASE_CAPSULE | Freq: Every day | ORAL | Status: DC

## 2013-03-13 ENCOUNTER — Ambulatory Visit (HOSPITAL_COMMUNITY)
Admission: RE | Admit: 2013-03-13 | Discharge: 2013-03-13 | Disposition: A | Discharge status: Home / Self Care | Payer: BC Managed Care – PPO | Source: Ambulatory Visit | Attending: Internal Medicine | Admitting: Internal Medicine

## 2013-03-13 ENCOUNTER — Encounter (HOSPITAL_COMMUNITY): Payer: Self-pay | Admitting: Radiology

## 2013-03-13 ENCOUNTER — Encounter: Payer: Self-pay | Admitting: Internal Medicine

## 2013-03-13 ENCOUNTER — Encounter (HOSPITAL_COMMUNITY): Payer: Self-pay | Admitting: Respiratory Therapy

## 2013-03-13 ENCOUNTER — Encounter: Payer: Self-pay | Admitting: *Deleted

## 2013-03-13 DIAGNOSIS — R0602 Shortness of breath: Secondary | ICD-10-CM | POA: Insufficient documentation

## 2013-03-13 DIAGNOSIS — R0609 Other forms of dyspnea: Secondary | ICD-10-CM | POA: Insufficient documentation

## 2013-03-13 DIAGNOSIS — R0989 Other specified symptoms and signs involving the circulatory and respiratory systems: Secondary | ICD-10-CM | POA: Insufficient documentation

## 2013-03-13 LAB — PULMONARY FUNCTION TEST

## 2013-03-13 MED ORDER — ALBUTEROL SULFATE (5 MG/ML) 0.5% IN NEBU
2.5000 mg | INHALATION_SOLUTION | Freq: Once | RESPIRATORY_TRACT | Status: AC
  Administered 2013-03-13: 2.5 mg via RESPIRATORY_TRACT

## 2013-03-13 NOTE — Progress Notes (Signed)
Quick Note:  Please contact pt and advise that the following labs are abnormal... Total cholesterol has gone up (about 100 points) compared to 4 months ago; LDL ("bad") cholesterol is back above 100.  HDL ("good") cholesterol is almost 2 times what it was 4 months ago; this is great! Your heart disease risk is less than 1/2 the average risk because your HDL is so high.  Basic chemistries are normal. Blood sugar is normal as is kidney function.  Continue your current nutrition habits and stay active. I will see you as scheduled in June 2014. ______

## 2013-03-14 NOTE — Progress Notes (Signed)
S:  This 62 y.o. Cauc female is here for evaluation of anal nodule and intermittent BRBPR; she thinks she has hemorrhoids but this hard nodule is definitely a new finding; she reports that  She can push it back inside at times. There is no pain associated w/ defecation. Last colonoscopy was 2009 (polyp) and she is due for repeat screening this year.  Pt also here for labs- lipids need to rechecked since pt is off statins (due to medication side effect). She also wants blood sugar rechecked as she is on chronic steroids for lung disease; glucose=116 back in Dec 2013.  Medication review- pt is taking Metoprolol succinate 50 mg 24-hr tablet but has been advised to take 1/2 tab by Cardiologist.; she is concerned because tab is not scored and she is not always taking correct dose. (I discussed changing this medication to 25 mg 24-hr tablet to insure correct dosing).   ROS: Negative for fatigue, diaphoresis, abnormal weight change, polydipsia or polyuria, myalgias or muscle cramping, HA, dizziness or weakness.  PMHx, Soc Hx and Fam Hx reviewed.   O:  Filed Vitals:   03/09/13 1453  BP: 118/74                                   Weight down 12 pounds since Nov 2013  Pulse: 71  Temp: 98.1 F (36.7 C)  Resp: 16   GEN: In NAD; WN,WD. COR: RRR. LUNGS: Normal resp rate and effort. ABD: Normal appearance; soft w/o guarding. No HSM or masses. GU: NEFG. Anal area- 5-7 mm hard bluish-red nodule at 8 o'clock and soft hemorrhoidal tag noted also. NT and no active bleeding noted. No masses in rectum. Stool normal color. SKIN: W&D; no rashes or pallor. NEURO: A&O x 3; CNs intact. Nonfocal.  A/P: Rectal bleeding - Plan: Ambulatory referral to Gastroenterology, IFOBT POC (occult bld, rslt in office)  Thrombosed external hemorrhoid  Other and unspecified hyperlipidemia - Plan: Lipid panel  Hyperglycemia -  A1c= 5.9% in Dec 2013.   Plan: Basic metabolic panel   Meds ordered this encounter  Medications   . alendronate (FOSAMAX) 70 MG tablet    Sig: Take 1 tablet (70 mg total) by mouth once a week. On Sunday    Dispense:  4 tablet    Refill:  3  . fluticasone (FLONASE) 50 MCG/ACT nasal spray    Sig: Place 2 sprays into the nose 2 (two) times daily.    Dispense:  16 g    Refill:  5  . levocetirizine (XYZAL) 5 MG tablet    Sig: Take 1 tablet (5 mg total) by mouth as needed. Takes during allergy season    Dispense:  30 tablet    Refill:  5  . metoprolol succinate (TOPROL-XL) 25 MG 24 hr tablet    Sig: Take 1 tablet (25 mg total) by mouth daily. Take with or immediately following a meal.    Dispense:  30 tablet    Refill:  5  . omeprazole (PRILOSEC) 20 MG capsule    Sig: Take 1 capsule (20 mg total) by mouth daily.    Dispense:  30 capsule    Refill:  5  . DISCONTD: pravastatin (PRAVACHOL) 40 MG tablet    Sig: Take 0.5 tablets (20 mg total) by mouth daily.    Dispense:  30 tablet    Refill:  3

## 2013-03-15 ENCOUNTER — Telehealth: Payer: Self-pay

## 2013-03-15 NOTE — Telephone Encounter (Signed)
Spoke to patient in regards to lab results- she was looking for her triglyceride number.   Patient is asking about her referral for a bone density test and her colonoscopy. Please call her with information in regards to scheduling these test.

## 2013-03-15 NOTE — Progress Notes (Signed)
PA approved for omeprazole from 03/14/14. Faxed to pharmacy.

## 2013-03-15 NOTE — Telephone Encounter (Signed)
Pt missed the phone call from where someone called her about her lab results if someone could call her back Pt states that if she doesn't answer to leave message on her phone, Call back number is [phone]

## 2013-03-15 NOTE — Telephone Encounter (Signed)
Pt is calling back about lab results, she was talking to someone yesterday. Call back number is [phone]

## 2013-03-16 ENCOUNTER — Telehealth: Payer: Self-pay | Admitting: Internal Medicine

## 2013-03-16 ENCOUNTER — Encounter: Payer: Self-pay | Admitting: *Deleted

## 2013-03-16 NOTE — Telephone Encounter (Signed)
She was supposed to have full pft on 03/13/13 on 20mg  daily prednisone. DId she have it ? If not, please tell her to have it. Need to know that to decide taper pred  Thanks  MR

## 2013-03-17 ENCOUNTER — Telehealth: Payer: Self-pay | Admitting: Internal Medicine

## 2013-03-17 NOTE — Telephone Encounter (Signed)
Results have been requested from [facility] and placed in your look-at. The pt is aware that we have sent a message to MR to give results. Carron Curie, CMA

## 2013-03-17 NOTE — Telephone Encounter (Signed)
Pt had PFT 03/13/13. Please advise MR thanks  [email or web address] for pt to advise will send message to MR regarding results.

## 2013-03-17 NOTE — Telephone Encounter (Signed)
Pt returned call.  [name] ° °

## 2013-03-20 NOTE — Telephone Encounter (Signed)
Duplicate phone note. Carron Curie, CMA

## 2013-03-21 ENCOUNTER — Telehealth: Payer: Self-pay

## 2013-03-21 ENCOUNTER — Other Ambulatory Visit: Payer: Self-pay | Admitting: Family Medicine

## 2013-03-21 MED ORDER — METOPROLOL SUCCINATE ER 50 MG PO TB24: 50.0000 mg | ORAL_TABLET | Freq: Every day | ORAL | Status: DC

## 2013-03-21 NOTE — Telephone Encounter (Signed)
As of yyesterday 03/20/13 6pm the PFT was not at my desk or look at. Pls give to me 03/21/2013 pm  Dr. Kalman Shan, M.D., [facility].C.P Pulmonary and Critical Care Medicine Staff Physician [place] [facility] [place] [facility] and Critical Care Pager: [phone], If no answer or between  15:00h - 7:00h: call [phone]  03/21/2013 10:16 AM

## 2013-03-21 NOTE — Telephone Encounter (Signed)
Pt reports HR> 100 at  Rest since Metoprolol dose was decreased to  25 mg. She has resumed higher dose of 50 mg and feels better. I will change Metropolol succinate back to 50 mg daily.

## 2013-03-21 NOTE — Progress Notes (Signed)
Quick Note:  Please contact pt and advise that the following labs are abnormal...  I would suggest she stay on the 1/2 tab of Pravastatin; when she returns in June, we can check lipids again and determine which dose is most effective (without side effects!). ______

## 2013-03-21 NOTE — Telephone Encounter (Signed)
Placed in look-at. Rhonda [name], CMA

## 2013-03-21 NOTE — Telephone Encounter (Signed)
PFTs 03/13/13 some better. So, can go down from prednisone 20mg  to 10mg  per day. Give ROV in 2 months with spirometry and walk test at followup.   Also, ensure and ask her if those weird pain has recurred or not ?  Thanks  MR

## 2013-03-21 NOTE — Telephone Encounter (Signed)
PT STATES DR MCPHERSON HAD DECREASED HER TOPROL-XL BUT HER HEART IS RACING AND SHE THINK IT MAY NEED TO BE DECREASED AGAIN PLEASE CALL [phone]   [facility] [address]

## 2013-03-22 NOTE — Telephone Encounter (Signed)
[email or web address].Jennifer Castillo, CMA  

## 2013-03-22 NOTE — Telephone Encounter (Signed)
Pt advised and OV set. Priyal Musquiz, CMA  

## 2013-03-22 NOTE — Telephone Encounter (Signed)
Not sure if i sent message on pft ornotto you  Dr. Kalman Shan, M.D., [facility].C.P Pulmonary and Critical Care Medicine Staff Physician [facility] [place] [facility] and Critical Care Pager: [phone], If no answer or between  15:00h - 7:00h: call [phone]  03/22/2013 12:17 PM

## 2013-03-30 ENCOUNTER — Telehealth: Payer: Self-pay

## 2013-03-30 NOTE — Telephone Encounter (Signed)
Patient is wondering what is going on with scheduling her bone density test. We have been trying to schedule it since January and have not contacted her yet.

## 2013-05-02 ENCOUNTER — Encounter: Payer: Self-pay | Admitting: Internal Medicine

## 2013-05-02 ENCOUNTER — Ambulatory Visit (INDEPENDENT_AMBULATORY_CARE_PROVIDER_SITE_OTHER): Payer: BC Managed Care – PPO | Admitting: Internal Medicine

## 2013-05-02 VITALS — BP 100/60 | HR 76 | Ht 60.24 in | Wt 147.5 lb

## 2013-05-02 DIAGNOSIS — K589 Irritable bowel syndrome without diarrhea: Secondary | ICD-10-CM

## 2013-05-02 DIAGNOSIS — Z86010 Personal history of colonic polyps: Secondary | ICD-10-CM

## 2013-05-02 DIAGNOSIS — R197 Diarrhea, unspecified: Secondary | ICD-10-CM

## 2013-05-02 DIAGNOSIS — K648 Other hemorrhoids: Secondary | ICD-10-CM

## 2013-05-02 MED ORDER — DICYCLOMINE HCL 10 MG PO CAPS: 10.0000 mg | ORAL_CAPSULE | Freq: Two times a day (BID) | ORAL | Status: DC

## 2013-05-02 MED ORDER — HYDROCORTISONE ACETATE 25 MG RE SUPP: 25.0000 mg | Freq: Two times a day (BID) | RECTAL | Status: DC

## 2013-05-02 MED ORDER — MOVIPREP 100 G PO SOLR: ORAL | Status: DC

## 2013-05-02 NOTE — Patient Instructions (Addendum)
You have been scheduled for a colonoscopy with propofol. Please follow written instructions given to you at your visit today.  Please pick up your prep kit at the pharmacy within the next 1-3 days.  Use your coupon for a free kit. If you use inhalers (even only as needed), please bring them with you on the day of your procedure. Your physician has requested that you go to www.startemmi.com and enter the access code given to you at your visit today. This web site gives a general overview about your procedure. However, you should still follow specific instructions given to you by our office regarding your preparation for the procedure.  We have sent the following medications to your pharmacy for you to pick up at your convenience: Anusol HC supp. , dicyclomine      Dr. Dow Adolph

## 2013-05-02 NOTE — Progress Notes (Signed)
Rhonda [name] [birth year] MRN [id number]        History of Present Illness:  This is a 62 year old white female with irregular bowel habits with predominant diarrhea and symptomatic hemorrhoids.  She has a rectal nodule which was noted several months ago by the patient and confirmed by Dr Rhonda [name].The nodule has now decreased in size considerably. She is experiencing rectal burning. Last colonoscopy in December 2007 showed hyperplastic polyps. She  had Hemoccult-negative cards and her hemoglobin has been normal at 12.6 hematocrit 38.0. Her irregular bowel habits don't seem  to be related to stress or to eating. She has occasional postprandial fecal  urgency   Past Medical History  Diagnosis Date  . High cholesterol   . History of chronic bronchitis     as child  . GERD (gastroesophageal reflux disease)   . Arthritis   . Depression   . Allergy   . Asthma   . Cataract   . Pneumonitis, hypersensitivity     a. 09/2012 s/p Bx - ? 2/2 bird, mold, oil paint exposure ->on steroids, followed by pulm.  . Rapid heart rate   . DOE (dyspnea on exertion)     a. 04/2010 Lexi MV EF 71%, no ischemia/infarct;    . Hyperplastic colon polyp 2007  . Hiatal hernia   . Interstitial lung disease   . Insulin resistance     past  . Inguinal hernia     right   Past Surgical History  Procedure Laterality Date  . Tubal ligation    . Foot fracture surgery  2006 or 2007    right  . Video bronchoscopy  11/19/2011    Procedure: VIDEO BRONCHOSCOPY WITH FLUORO;  Surgeon: Kalman Shan, MD;  Location: [facility] ENDOSCOPY;  Service: Endoscopy;;  . Hymenectomy    . Cesarean section    . Video assisted thoracoscopy  09/28/2012    Procedure: VIDEO ASSISTED THORACOSCOPY;  Surgeon: Loreli Slot, MD;  Location: [facility] OR;  Service: Thoracic;  Laterality: Right;  . Lung biopsy  09/28/2012    Procedure: LUNG BIOPSY;  Surgeon: Loreli Slot, MD;  Location: [facility] OR;  Service: Thoracic;  Laterality: N/A;   lung biopsies tims three  . Breast biopsy Right 2009    BIOPSY    reports that she has never smoked. She has never used smokeless tobacco. She reports that she drinks about 1.1 ounces of alcohol per week. She reports that she does not use illicit drugs. family history includes Asthma in her maternal grandmother and mother; Bone cancer in her paternal grandfather; Breast cancer in her paternal aunt; Dementia (age of onset: 63) in her mother; Diabetes in her father; Emphysema in her maternal grandmother; Heart disease in her sister; Hypertension in her sister; Kidney disease in her sister; Lung disease in her maternal grandfather; Lymphoma in her father; Osteoarthritis in her mother; and Ovarian cancer in her maternal aunt. Allergies  Allergen Reactions  . Atorvastatin Other (See Comments)    Leg pain  . Betadine (Povidone Iodine) Other (See Comments)    blisters  . Codeine Nausea And Vomiting  . Rosuvastatin Other (See Comments)    Leg pain  . Shellfish Allergy Other (See Comments)    vomiting  . Sulfonamide Derivatives Other (See Comments)    headaches        Review of Systems: Denies dysphagia odynophagia bloating  The remainder of the 10 point ROS is negative except as outlined in H&P   Physical Exam: General appearance  Well developed, in no distress. Eyes- non icteric. HEENT nontraumatic, normocephalic. Mouth no lesions, tongue papillated, no cheilosis. Neck supple without adenopathy, thyroid not enlarged, no carotid bruits, no JVD. Lungs Clear to auscultation bilaterally. Cor normal S1, normal S2, regular rhythm, no murmur,  quiet precordium. Abdomen: Mildly obese. Soft. Nontender, normoactive bowel sounds no palpable mass or pulsations. Liver edge at costal margin Rectal: And anoscopic exam reveals a several small skin tags externally. 1 skin tag has an indurated 3 mm nodule which is fibrotic and represents most likely scar tissue. It may be from  previous fissure or from  an previous hemorrhoid. It appears completely inactive non tender and has no drainage. It does not prolapse. There are first grade internal hemorrhoids .Rectal sphincter tone is normal. Stool is Hemoccult negative Extremities no pedal edema. Skin no lesions. Neurological alert and oriented x 3. Psychological normal mood and affect.  Assessment and Plan:  62 year old white female with irritable bowel syndrome with predominant diarrhea. We will start her on dicyclomine 10 mg once a day with optional additional dicyclomine in the afternoon. She will continue high-fiber diet. She is due for colonoscopy History of hyperplastic polyp of the colon 2007. She is due for repeat colonoscopy which will be scheduled at her convenience  Perirectal nodule of no clinical significance. Most likely healed inflamed skin intact with remaining small fibroma. I would not encourage surgical removal. We give her Anusol-HC suppositories for internal hemorrhoids    05/02/2013 Rhonda [name]

## 2013-05-03 ENCOUNTER — Encounter: Payer: Self-pay | Admitting: Internal Medicine

## 2013-05-22 ENCOUNTER — Ambulatory Visit: Payer: BC Managed Care – PPO | Admitting: Internal Medicine

## 2013-05-25 ENCOUNTER — Encounter: Payer: Self-pay | Admitting: Internal Medicine

## 2013-05-25 ENCOUNTER — Other Ambulatory Visit: Payer: Self-pay | Admitting: Family Medicine

## 2013-05-25 ENCOUNTER — Ambulatory Visit (INDEPENDENT_AMBULATORY_CARE_PROVIDER_SITE_OTHER): Payer: BC Managed Care – PPO | Admitting: Internal Medicine

## 2013-05-25 VITALS — BP 110/70 | HR 60 | Temp 98.0°F | Ht 60.5 in | Wt 147.6 lb

## 2013-05-25 DIAGNOSIS — J679 Hypersensitivity pneumonitis due to unspecified organic dust: Secondary | ICD-10-CM

## 2013-05-25 DIAGNOSIS — E8881 Metabolic syndrome: Secondary | ICD-10-CM

## 2013-05-25 DIAGNOSIS — M8580 Other specified disorders of bone density and structure, unspecified site: Secondary | ICD-10-CM

## 2013-05-25 DIAGNOSIS — IMO0002 Reserved for concepts with insufficient information to code with codable children: Secondary | ICD-10-CM

## 2013-05-25 DIAGNOSIS — G8929 Other chronic pain: Secondary | ICD-10-CM

## 2013-05-25 NOTE — Progress Notes (Signed)
Subjective:    Patient ID: Rhonda [name], female    DOB: 1951/05/07, 62 y.o.   MRN: [id number]  HPI    Subjective:    Patient ID: Rhonda [name], female    DOB: 08-Jan-1951, 62 y.o.   MRN: [id number]  HPI  #GE reflux with small hiatal hernia  - on ppi  #MEtabolic Syndrome  - On low glycemic diet  159#  on 09/19/12 147# on 11/21/12 145# on 01/02/13 145# on 02/28/2013 147# - on 05/25/2013    Weight:        #History of rapid heart rate not otherwise specified  - Start her on Lopressor  2008/2009 by primary care physician. History appears to correlate with onset of respiratory issues  # Hypersensitivity Pneumonitis  - Potential etiologies - cockateil x 2 for 18 years through end 2012. 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 However, there is worrying trend of UIP pattern in the Upper lobes.    #Exposure Hx (retake Nov 2013)       Home exposures .: Negative for: She denies using feather pillows or using sauna or hot tub. Denies mold on clothes or wet carpets. Denies dish washer leakage. Denies musty smell in house (but this was a problem in 2011-2012) and denies hx of flooding, water damange to ceilings/walls.  No carpets in down floor. Upstairs new carpets and this not wet. Questionable exposures: House 1936 built.. Has sun room full of plants - occ mushrooms in this but no mold. Does have humidifier because dry air makes her cough but denies it has mold. No mildew in house/bathroom.  However, their basement is old and she says is "Weird" but they do not go there. Duct system they think should be free of mold but not sure.  Positive expo:   The main exposures appear to be the birds for > 20 years which past 1 year no longer exposed to. 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 She remembers musty smell in unused barn and in  [place] house last year which really triggerd her resp issues (sept 2012 admission) and needed prednisone. She does do lot of potting and work with [employer]    Drug hx:  Drug hx  She was on nitrafurantoin 5 years ago on regular basis but not in past 5 years   Occupational hx: negative for vet work, Scientist, forensic, grain or flour processing, Retail buyer work, Press photographer work and Therapist, sports and dust exposure work and Administrator, sports, Network engineer. 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 Improved crackles.  Improved dyspnea except @ hill   Fev1  1.9 L   1.7L/90%  1.66 L/87%   1.66  L/78%  1.75L/90% 1.68L/82%  FVC  2.6 L    2.2 L/82%   2.1 L/80%   2.19 L/82%  2.2L/91% 2.1/81%  Ratio     79   76/95%  80/99% 80/100%  fef 25-75%         TLC  4.3 L    3.3 L/79%  3.5 L/82%      DLCO  10/50%    10/51%   9.6/51%      Walk test 185 ft x 3 laps on RA  rest 92%. Pk exertion 90%. Pk HR 108 Rest 92%. Pk exertion 86% at 2.5 laps. Pk HR 102  pk exertion - 88% Rest 96%. Pk 95%. Pk HR is 75. Done at full dose lopressor Rest 94%. Pk 91%. Rest HR 93 with pk HR 103/min. Done at half dose lopressor REst 96%. Pk pulse ox 92%. Rest HR 65. Pk HR 86  CT chest   unchanged      Tests/Bx Bronch 0- non diagnostic   VATS - HP with UIP in Upper lobe     RX    Rx pred start 11/23/12 On  pred 30mg  per day  and will reduce to 20mg  On Pred 20mg  per day On pred 10mg  per day               OV 02/28/13 Full of a hypersensitivity pneumonitis with UIP pattern  She is now taking 20 mg prednisone a day. She finds that her effort tolerance is stable versus improved compared to 01/02/2013. She feels she can take deeper breaths worsening and last longer. However climbing hills still problematic. She feels this is due to her tachycardia. She says that since she cut down the Lopressor to half her normal dose at the time of last visit in February 2014 her tachycardia is worse particularly at night. She was to go back on full dose Lopressor. She is reluctant to see the cardiologist about this.  She continues to be concerned about the long-term effects of prednisone. She says her height now measures 4 feet 11 inches which is 2 inches shorter than her baseline. She has not had a bone scan there been some issues with with getting this. She therefore she is on alendronate  Of note, her main concern is a few weeks ago she woke up in the middle of the night with acute right pleuritic chest pain that was severe but improved with walking the same thing was repeated last night. Other than this there no other symptoms. There is no associated hemoptysis, edema, fever, sputum, cough. She's wondering if this is related to rib fracture or muscle pull of vertebral fracture.  REC Please have CXR Today Will call you with results to decide on cutting prednisone down Can Increase lopressor to your original dose Followup depending on cxr    OV 05/25/2013 FU  Of HP with UIP pattern and weight mgmt  Now down to 10mg  pred a day. Doing well. OVerall no resp issues. FVC, fev1 and walking pulse ox today is stable. Only issue is still while walking hills gets dyspneic. She did check pulse ox while mountain climbing and did ddrop tp 82% and she needed > 1 minute to settle down. SHe is not interested in using o2 with mountain climbing. She  wants to cut prednisone down  In terms of weight: stable. SHe wants to lose some more weight but feels stuck. Currently Estimated body mass index is 28.34 kg/(m^2) as calculated from the following:  Height as of this encounter: 5' 0.5" (1.537 m).   Weight as of this encounter: 147 lb 9.6 oz (66.951 kg).     Past, Family, Social reviewed: she is frustrated Dow Adolph, MD office has not set up bone densiety scan for her. Now having Rt hip pain x few months. Worse with exertion. Moderate. Unable to swim well. I spoke to [facility], MD personally and she will arrange that     Current outpatient prescriptions:alendronate (FOSAMAX) 70 MG tablet, Take 1 tablet (70 mg total) by mouth once a week. On Sunday, Disp: 4 tablet, Rfl: 3;  calcium elemental as carbonate (TUMS ULTRA 1000) 400 MG tablet, Chew 1,000 mg by mouth daily., Disp: , Rfl: ;  Calcium-Vitamin D-Vitamin K (VIACTIV) 500-500-40 MG-UNT-MCG CHEW, Chew 1-2 tablets by mouth daily., Disp: , Rfl:  dicyclomine (BENTYL) 10 MG capsule, Take 10 mg by mouth daily., Disp: , Rfl: ;  fluticasone (FLONASE) 50 MCG/ACT nasal spray, Place 2 sprays into the nose as needed., Disp: , Rfl: ;  hydrocortisone (ANUSOL-HC) 25 MG suppository, Place 25 mg rectally as needed., Disp: , Rfl: ;  levocetirizine (XYZAL) 5 MG tablet, Take 1 tablet (5 mg total) by mouth as needed. Takes during allergy season, Disp: 30 tablet, Rfl: 5 metoprolol succinate (TOPROL-XL) 50 MG 24 hr tablet, Take 1 tablet (50 mg total) by mouth daily. Take with or immediately following a meal., Disp: 90 tablet, Rfl: 3;  naproxen sodium (ANAPROX) 220 MG tablet, Take 220 mg by mouth 2 (two) times daily with a meal., Disp: , Rfl: ;  omeprazole (PRILOSEC) 20 MG capsule, Take 1 capsule (20 mg total) by mouth daily., Disp: 30 capsule, Rfl: 5 ondansetron (ZOFRAN-ODT) 4 MG disintegrating tablet, Take 1 tablet (4 mg total) by mouth every 8 (eight) hours as needed. (For nausea)., Disp: 20 tablet,  Rfl: 1;  pravastatin (PRAVACHOL) 40 MG tablet, Take 20 mg by mouth daily. , Disp: , Rfl: ;  predniSONE (DELTASONE) 10 MG tablet, Take 10mg  daily as directed., Disp: , Rfl: ;  MOVIPREP 100 G SOLR, Use per prep instructions, Disp: 1 kit, Rfl: 0    Review of Systems  Constitutional: Negative for fever and unexpected weight change.  HENT: Negative for ear pain, nosebleeds, congestion, sore throat, rhinorrhea, sneezing, trouble swallowing, dental problem, postnasal drip and sinus pressure.   Eyes: Negative for redness and itching.  Respiratory: Negative for cough, chest tightness, shortness of breath and wheezing.   Cardiovascular: Negative for palpitations and leg swelling.  Gastrointestinal: Negative for nausea and vomiting.  Genitourinary: Negative for dysuria.  Musculoskeletal: Negative for joint swelling.  Skin: Negative for rash.  Neurological: Negative for headaches.  Hematological: Does not bruise/bleed easily.  Psychiatric/Behavioral: Negative for dysphoric mood. The patient is not nervous/anxious.        Objective:   Physical Exam   Vitals reviewed. Constitutional: She is oriented to person, place, and time. She appears well-developed and well-nourished. No distress.  Body mass index is 28.34 kg/(m^2).  HENT:  Head: Normocephalic and atraumatic.  Right Ear: External ear normal.  Left Ear: External ear normal.  Mouth/Throat: Oropharynx is clear and moist. No oropharyngeal exudate.  Eyes: Conjunctivae and EOM are normal. Pupils are equal, round, and reactive to light. Right eye exhibits no discharge. Left eye exhibits no discharge. No scleral icterus.  Neck: Normal range of motion. Neck supple. No JVD present. No tracheal deviation present. No thyromegaly present.  Cardiovascular: Normal rate, regular rhythm, normal heart sounds and intact distal pulses.  Exam  reveals no gallop and no friction rub.   No murmur heard. Pulmonary/Chest: Effort normal. No respiratory distress. She  has no wheezes. She has rales anteriorly and these are mild and definitely less than pre-prednisone but not resolved. She exhibits no tenderness.  Abdominal: Soft. Bowel sounds are normal. She exhibits no distension and no mass. There is no tenderness. There is no rebound and no guarding.  Musculoskeletal: Normal range of motion. She exhibits no edema and no tenderness.  Lymphadenopathy:    She has no cervical adenopathy.  Neurological: She is alert and oriented to person, place, and time. She has normal reflexes. No cranial nerve deficit. She exhibits normal muscle tone. Coordination normal.  Skin: Skin is warm and dry. No rash noted. She is not diaphoretic. No erythema. No pallor.  Psychiatric: She has a normal mood and affect. Her behavior is normal. Judgment and thought content normal      Assessment & Plan:

## 2013-05-25 NOTE — Patient Instructions (Addendum)
#  HP Your lung disease is stable but the UIP part is irr-reversible. I did caution her that this could turn out be progressive Cut prednisone down to 5mg  per day; will send script Advised o2 for mountains; agree you want to think about it Return in 3-4 months  - full PFT and walk test at followup  #Hip Right Pain FYIU: I spoke to Dallas Schimke at [facility], [place] office and she said they would get bone scan on you  #WEight management  - Limit "bad carbs": to < 100g/day. Total carbs - fiber is "bad carbs".

## 2013-05-25 NOTE — Assessment & Plan Note (Signed)
#  HP Your lung disease is stable but the UIP part is irr-reversible. I did caution her that this could turn out be progressive Cut prednisone down to 5mg  per day; will send script Advised o2 for mountains; agree you want to think about it Return in 3-4 months  - full PFT and walk test at followup

## 2013-05-25 NOTE — Assessment & Plan Note (Signed)
#  WEight management  - Limit "bad carbs": to < 100g/day. Total carbs - fiber is "bad carbs".

## 2013-06-07 ENCOUNTER — Encounter: Payer: Self-pay | Admitting: Internal Medicine

## 2013-06-07 ENCOUNTER — Ambulatory Visit (AMBULATORY_SURGERY_CENTER): Payer: BC Managed Care – PPO | Admitting: Internal Medicine

## 2013-06-07 VITALS — BP 143/72 | HR 64 | Temp 97.9°F | Resp 14 | Ht 60.0 in | Wt 147.0 lb

## 2013-06-07 DIAGNOSIS — Z1211 Encounter for screening for malignant neoplasm of colon: Secondary | ICD-10-CM

## 2013-06-07 DIAGNOSIS — Z86010 Personal history of colonic polyps: Secondary | ICD-10-CM

## 2013-06-07 MED ORDER — SODIUM CHLORIDE 0.9 % IV SOLN: 500.0000 mL | INTRAVENOUS | Status: DC

## 2013-06-07 NOTE — Patient Instructions (Addendum)
YOU HAD AN ENDOSCOPIC PROCEDURE TODAY AT THE [facility]: Refer to the procedure report that was given to you for any specific questions about what was found during the examination.  If the procedure report does not answer your questions, please call your gastroenterologist to clarify.  If you requested that your care partner not be given the details of your procedure findings, then the procedure report has been included in a sealed envelope for you to review at your convenience later.  YOU SHOULD EXPECT: Some feelings of bloating in the abdomen. Passage of more gas than usual.  Walking can help get rid of the air that was put into your GI tract during the procedure and reduce the bloating. If you had a lower endoscopy (such as a colonoscopy or flexible sigmoidoscopy) you may notice spotting of blood in your stool or on the toilet paper. If you underwent a bowel prep for your procedure, then you may not have a normal bowel movement for a few days.  DIET: Your first meal following the procedure should be a light meal and then it is ok to progress to your normal diet.  A half-sandwich or bowl of soup is an example of a good first meal.  Heavy or fried foods are harder to digest and may make you feel nauseous or bloated.  Likewise meals heavy in dairy and vegetables can cause extra gas to form and this can also increase the bloating.  Drink plenty of fluids but you should avoid alcoholic beverages for 24 hours.  ACTIVITY: Your care partner should take you home directly after the procedure.  You should plan to take it easy, moving slowly for the rest of the day.  You can resume normal activity the day after the procedure however you should NOT DRIVE or use heavy machinery for 24 hours (because of the sedation medicines used during the test).    SYMPTOMS TO REPORT IMMEDIATELY: A gastroenterologist can be reached at any hour.  During normal business hours, 8:30 AM to 5:00 PM Monday through Friday,  call [phone].  After hours and on weekends, please call the GI answering service at [phone] who will take a message and have the physician on call contact you.   Following lower endoscopy (colonoscopy or flexible sigmoidoscopy):  Excessive amounts of blood in the stool  Significant tenderness or worsening of abdominal pains  Swelling of the abdomen that is new, acute  Fever of 100F or higher  Black, tarry-looking stools  FOLLOW UP: If any biopsies were taken you will be contacted by phone or by letter within the next 1-3 weeks.  Call your gastroenterologist if you have not heard about the biopsies in 3 weeks.  Our staff will call the home number listed on your records the next business day following your procedure to check on you and address any questions or concerns that you may have at that time regarding the information given to you following your procedure. This is a courtesy call and so if there is no answer at the home number and we have not heard from you through the emergency physician on call, we will assume that you have returned to your regular daily activities without incident.  You and/or your care partner have signed paperwork which will be entered into your electronic medical record.  These signatures attest to the fact that that the information above on your After Visit Summary has been reviewed and is understood.  Full responsibility of the  confidentiality of this discharge information lies with you and/or your care-partner.  Normal colonoscopy, repeat in 10 years,2024

## 2013-06-07 NOTE — Progress Notes (Signed)
Pt. tol procedure well, VSS. No concerns or complaints voiced.

## 2013-06-07 NOTE — Progress Notes (Signed)
Patient did not experience any of the following events: a burn prior to discharge; a fall within the facility; wrong site/side/patient/procedure/implant event; or a hospital transfer or hospital admission upon discharge from the facility. (G8907) Patient did not have preoperative order for IV antibiotic SSI prophylaxis. (G8918)  

## 2013-06-07 NOTE — Op Note (Addendum)
[facility] [address] [place], [address]   COLONOSCOPY PROCEDURE REPORT  PATIENT: Rhonda [name], Rhonda [name]  MR#: [id number] BIRTHDATE: 1951/01/03 , 62  yrs. old GENDER: Female ENDOSCOPIST: Hart Carwin, MD REFERRED BY:  Dow Adolph, M.D. PROCEDURE DATE:  06/07/2013 PROCEDURE:   Colonoscopy, surveillance ASA CLASS:   Class II INDICATIONS:Patient's personal history of colon polyps and hyperplastic polyps in 2007, rectal nodule. MEDICATIONS: MAC sedation, administered by CRNA and propofol (Diprivan) 200mg  IV  DESCRIPTION OF PROCEDURE:   After the risks and benefits and of the procedure were explained, informed consent was obtained.  A digital rectal exam revealed no abnormalities of the rectum.    The LB [id number]  endoscope was introduced through the anus and advanced to the cecum, which was identified by both the appendix and ileocecal valve .  The quality of the prep was good, using MoviPrep .  The instrument was then slowly withdrawn as the colon was fully examined.     COLON FINDINGS: A normal appearing cecum, ileocecal valve, and appendiceal orifice were identified.  The ascending, hepatic flexure, transverse, splenic flexure, descending, sigmoid colon and rectum appeared unremarkable. There was a small fibrotic nodule at the rectal orifice, no drainage or redness, No polyps or cancers were seen.     Retroflexed views revealed no abnormalities.     The scope was then withdrawn from the patient and the procedure completed.  COMPLICATIONS: There were no complications. ENDOSCOPIC IMPRESSION: Normal colon Small perirectal fibrotic nodule of no clinical significance  RECOMMENDATIONS: High fiber diet   REPEAT EXAM: In 10 year(s)  for Colonoscopy.  cc:  _______________________________ eSignedHart Carwin, MD 06/07/2013 9:33 AM     PATIENT NAME:  Rhonda [name], Rhonda [name] MR#: [id number]

## 2013-06-08 ENCOUNTER — Telehealth: Payer: Self-pay | Admitting: *Deleted

## 2013-06-08 NOTE — Telephone Encounter (Signed)
  Follow up Call-  Call back number 06/07/2013  Post procedure Call Back phone  # [phone]  Permission to leave phone message Yes     Patient questions:  Do you have a fever, pain , or abdominal swelling? no Pain Score  0 *  Have you tolerated food without any problems? yes  Have you been able to return to your normal activities? yes  Do you have any questions about your discharge instructions: Diet   no Medications  no Follow up visit  no  Do you have questions or concerns about your Care? no  Actions: * If pain score is 4 or above: No action needed, pain <4.

## 2013-07-02 ENCOUNTER — Other Ambulatory Visit: Payer: Self-pay | Admitting: Family Medicine

## 2013-07-04 ENCOUNTER — Encounter: Payer: Self-pay | Admitting: Family Medicine

## 2013-07-11 ENCOUNTER — Ambulatory Visit
Admission: RE | Admit: 2013-07-11 | Discharge: 2013-07-11 | Disposition: A | Discharge status: Home / Self Care | Payer: BC Managed Care – PPO | Source: Ambulatory Visit | Attending: Rheumatology | Admitting: Rheumatology

## 2013-07-11 ENCOUNTER — Other Ambulatory Visit: Payer: Self-pay | Admitting: Rheumatology

## 2013-07-11 DIAGNOSIS — R1031 Right lower quadrant pain: Secondary | ICD-10-CM

## 2013-07-13 ENCOUNTER — Other Ambulatory Visit: Payer: Self-pay

## 2013-07-13 DIAGNOSIS — Z1231 Encounter for screening mammogram for malignant neoplasm of breast: Secondary | ICD-10-CM

## 2013-07-18 ENCOUNTER — Ambulatory Visit (INDEPENDENT_AMBULATORY_CARE_PROVIDER_SITE_OTHER): Payer: BC Managed Care – PPO | Admitting: Family Medicine

## 2013-07-18 ENCOUNTER — Encounter: Payer: Self-pay | Admitting: Family Medicine

## 2013-07-18 VITALS — BP 100/62 | HR 91 | Temp 98.2°F | Resp 16 | Ht 60.5 in | Wt 145.0 lb

## 2013-07-18 DIAGNOSIS — M25559 Pain in unspecified hip: Secondary | ICD-10-CM

## 2013-07-18 DIAGNOSIS — E785 Hyperlipidemia, unspecified: Secondary | ICD-10-CM

## 2013-07-18 DIAGNOSIS — Z760 Encounter for issue of repeat prescription: Secondary | ICD-10-CM

## 2013-07-18 DIAGNOSIS — G8929 Other chronic pain: Secondary | ICD-10-CM

## 2013-07-18 MED ORDER — ALENDRONATE SODIUM 70 MG PO TABS: ORAL_TABLET | ORAL | Status: DC

## 2013-07-18 MED ORDER — TRAMADOL-ACETAMINOPHEN 37.5-325 MG PO TABS: ORAL_TABLET | ORAL | Status: DC

## 2013-07-18 NOTE — Progress Notes (Signed)
S:  This 62 y.o. Cauc female is here for labs and medication review; she is taking reduced Pravastatin dose= 20 mg for 4 months and needs lipid profile checked. Pt denies myalgias or generalized weakness.  Pt wants to discuss results of DEXA; she was advised by Dr. Kellie Simmering that she report shows osteopenia. She is taking Fosamax (started by Dr. Kellie Simmering). She has chronic R hip pain w/ occasional radiation down to knee; plain xray done at that physician's office shows deg joint disease. Pain is worse upon awakening and is exacerbated by exercise. She is taking APAP per that physician's advise in order to remain active (swimming and other activities daily); maximum daily dose advised= 3000 mg. She no longer takes NSAIDs and hopes to be off Prednisone in a few months.  Pt continues to follow [facility] Lipid Diet which has helped her maintain weight loss. She would like to get off an extra 10-15 pounds.  Patient Active Problem List   Diagnosis Date Noted  . High risk medication use 01/03/2013  . Osteopenia 10/28/2012  . Metabolic syndrome 09/20/2012  . Rapid heart rate   . Palpitations 12/16/2011  . 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 oil pain and ? mold in house 10/29/2011  . Snoring 10/29/2011  . HYPERLIPIDEMIA-MIXED 04/02/2010  . ABNORMAL EKG 04/02/2010  . DEPRESSION 03/27/2010  . MITRAL REGURGITATION 03/27/2010  . DYSPNEA ON EXERTION 03/27/2010  . Personal history of unspecified circulatory disease 03/27/2010   PMHx, Soc Hx and Fam Hx reviewed. Medications reconciled.  ROS: As per HPI; otherwise noncontributory. She does have some DOE when [place] but no SOB when climbing stairs.  O:  Filed Vitals:   07/18/13 1305  BP: 100/62  Pulse: 91  Temp: 98.2 F (36.8 C)  Resp: 16   GEN: In NAD; WN,WD. HENT: [place]/AT. EOMI w/ clear conj/ sclerae. Otherwise unremarkable. COR: RRR. No edema.\ LUNGS: Normal resp rate and effort. SKIN: W&D; no rashes or  pallor. MS: MAEs; mild limp favoring R leg. No c/c/e. NEURO: A&O x 3; CNs intact. Nonfocal.  A/P: HYPERLIPIDEMIA-MIXED - Continue Pravastatin 20 mg 1 tablet at bedtime.  Plan: Lipid panel, ALT  Hip pain, chronic, right- Continue regular exercise to strengthen bones; take Tylenol 500 mg 2 tabs each morning and use Tramadol- APAP in afternoon as needed after exercise. Pt advised of common side effects.  Issue of repeat prescriptions  Meds ordered this encounter  Medications  . alendronate (FOSAMAX) 70 MG tablet    Sig: TAKE 1 TABLET BY MOUTH ONCE A WEEK. ON SUNDAY    Dispense:  12 tablet    Refill:  3  . traMADol-acetaminophen (ULTRACET) 37.5-325 MG per tablet    Sig: Take 1 tablet every 8 hours as needed for pain.    Dispense:  40 tablet    Refill:  0

## 2013-07-19 LAB — LIPID PANEL
Cholesterol: 246 mg/dL — ABNORMAL HIGH (ref 0–200)
HDL: 71 mg/dL (ref >39)
Total CHOL/HDL Ratio: 3.5 Ratio
VLDL: 39 mg/dL (ref 0–40)

## 2013-07-20 NOTE — Progress Notes (Signed)
Quick Note:  Please contact pt and advise that the following labs are abnormal... Lipids are about the same as 4 months ago (off statin because of side effect). Get OTC Fish Oil and take 2 grams daily to help lower lipids. You may want to try [employer] which is a smaller capsule and better tolerate (no "fishy" after taste). Heart disease risk is about 1/2 average.  Lipids should be rechecked in ~ 3-6 months.  Copy to pt. ______

## 2013-07-26 ENCOUNTER — Telehealth: Payer: Self-pay

## 2013-07-26 NOTE — Telephone Encounter (Signed)
PT STATES SOMEONE WAS GOING TO CALL HER BACK ABOUT WHETHER TO CONTINUE TAKING HER MEDICINE OR TO INCREASE THE DOSAGE. SHE HASN'T HEARD FROM ANYONE PLEASE CALL [phone]

## 2013-07-26 NOTE — Telephone Encounter (Signed)
Quick Note:  Please contact pt and advise that the following labs are abnormal... Lipids are about the same as 4 months ago (off statin because of side effect). Get OTC Fish Oil and take 2 grams daily to help lower lipids. You may want to try [employer] which is a smaller capsule and better tolerate (no "fishy" after taste). Heart disease risk is about 1/2 average.  Notes Recorded by Maurice March, MD on 07/23/2013 at 4:05 PM I have reviewed notes going back to Dec 2013. Pt was having some leg pain/myalgias so we discussed discontinuing Pravastatin in April. However, a note from the Cardiology office states that she agreed to continue taking 1/2 tablet. I thought she was off this medication but if she is still taking it, I would appreciate that clarification. I have not refilled Pravastatin since before April; I suppose she has ample supply if she is taking only 1/2 tablet daily.   Called patient. To advise of above, she is advised to continue at 1/2 of the dose.

## 2013-08-07 DIAGNOSIS — M129 Arthropathy, unspecified: Secondary | ICD-10-CM | POA: Insufficient documentation

## 2013-08-07 DIAGNOSIS — J45909 Unspecified asthma, uncomplicated: Secondary | ICD-10-CM | POA: Insufficient documentation

## 2013-08-07 DIAGNOSIS — Z888 Allergy status to other drugs, medicaments and biological substances status: Secondary | ICD-10-CM | POA: Insufficient documentation

## 2013-08-07 DIAGNOSIS — Z8709 Personal history of other diseases of the respiratory system: Secondary | ICD-10-CM | POA: Insufficient documentation

## 2013-08-07 DIAGNOSIS — Z8719 Personal history of other diseases of the digestive system: Secondary | ICD-10-CM | POA: Insufficient documentation

## 2013-08-07 DIAGNOSIS — Z885 Allergy status to narcotic agent status: Secondary | ICD-10-CM | POA: Insufficient documentation

## 2013-08-07 DIAGNOSIS — Z860100 Personal history of colon polyps, unspecified: Secondary | ICD-10-CM | POA: Insufficient documentation

## 2013-08-07 DIAGNOSIS — J9383 Other pneumothorax: Secondary | ICD-10-CM | POA: Insufficient documentation

## 2013-08-07 DIAGNOSIS — Z882 Allergy status to sulfonamides status: Secondary | ICD-10-CM | POA: Insufficient documentation

## 2013-08-07 DIAGNOSIS — K219 Gastro-esophageal reflux disease without esophagitis: Secondary | ICD-10-CM | POA: Insufficient documentation

## 2013-08-07 DIAGNOSIS — Z9109 Other allergy status, other than to drugs and biological substances: Secondary | ICD-10-CM | POA: Insufficient documentation

## 2013-08-07 DIAGNOSIS — J8410 Pulmonary fibrosis, unspecified: Secondary | ICD-10-CM | POA: Insufficient documentation

## 2013-08-07 DIAGNOSIS — Z79899 Other long term (current) drug therapy: Secondary | ICD-10-CM | POA: Insufficient documentation

## 2013-08-07 DIAGNOSIS — F329 Major depressive disorder, single episode, unspecified: Secondary | ICD-10-CM | POA: Insufficient documentation

## 2013-08-07 DIAGNOSIS — F3289 Other specified depressive episodes: Secondary | ICD-10-CM | POA: Insufficient documentation

## 2013-08-07 DIAGNOSIS — E7800 Pure hypercholesterolemia, unspecified: Secondary | ICD-10-CM | POA: Insufficient documentation

## 2013-08-08 ENCOUNTER — Telehealth: Payer: Self-pay | Admitting: Internal Medicine

## 2013-08-08 ENCOUNTER — Emergency Department (HOSPITAL_COMMUNITY)
Admission: EM | Admit: 2013-08-08 | Discharge: 2013-08-08 | Disposition: A | Discharge status: Home / Self Care | Payer: BC Managed Care – PPO | Attending: Emergency Medicine | Admitting: Emergency Medicine

## 2013-08-08 ENCOUNTER — Encounter: Payer: Self-pay | Admitting: Thoracic Surgery (Cardiothoracic Vascular Surgery)

## 2013-08-08 ENCOUNTER — Emergency Department (HOSPITAL_COMMUNITY): Payer: BC Managed Care – PPO

## 2013-08-08 ENCOUNTER — Ambulatory Visit: Payer: BC Managed Care – PPO

## 2013-08-08 ENCOUNTER — Ambulatory Visit (INDEPENDENT_AMBULATORY_CARE_PROVIDER_SITE_OTHER): Payer: BC Managed Care – PPO | Admitting: Thoracic Surgery (Cardiothoracic Vascular Surgery)

## 2013-08-08 ENCOUNTER — Encounter (HOSPITAL_COMMUNITY): Payer: Self-pay | Admitting: Emergency Medicine

## 2013-08-08 ENCOUNTER — Other Ambulatory Visit: Payer: Self-pay | Admitting: Thoracic Surgery (Cardiothoracic Vascular Surgery)

## 2013-08-08 ENCOUNTER — Emergency Department (HOSPITAL_COMMUNITY)
Admit: 2013-08-08 | Discharge: 2013-08-08 | Disposition: A | Discharge status: Home / Self Care | Payer: BC Managed Care – PPO | Attending: Emergency Medicine | Admitting: Emergency Medicine

## 2013-08-08 ENCOUNTER — Ambulatory Visit
Admission: RE | Admit: 2013-08-08 | Discharge: 2013-08-08 | Disposition: A | Discharge status: Home / Self Care | Payer: BC Managed Care – PPO | Source: Ambulatory Visit | Attending: Thoracic Surgery (Cardiothoracic Vascular Surgery) | Admitting: Thoracic Surgery (Cardiothoracic Vascular Surgery)

## 2013-08-08 ENCOUNTER — Other Ambulatory Visit: Payer: Self-pay

## 2013-08-08 VITALS — BP 104/69 | HR 71 | Resp 16 | Ht 60.5 in | Wt 146.0 lb

## 2013-08-08 DIAGNOSIS — J849 Interstitial pulmonary disease, unspecified: Secondary | ICD-10-CM

## 2013-08-08 DIAGNOSIS — J8410 Pulmonary fibrosis, unspecified: Secondary | ICD-10-CM

## 2013-08-08 DIAGNOSIS — J9383 Other pneumothorax: Secondary | ICD-10-CM

## 2013-08-08 DIAGNOSIS — J930 Spontaneous tension pneumothorax: Secondary | ICD-10-CM

## 2013-08-08 DIAGNOSIS — J939 Pneumothorax, unspecified: Secondary | ICD-10-CM

## 2013-08-08 HISTORY — DX: Pulmonary fibrosis, unspecified: J84.10

## 2013-08-08 LAB — BASIC METABOLIC PANEL
Chloride: 104 mEq/L (ref 96–112)
Creatinine, Ser: 0.85 mg/dL (ref 0.50–1.10)
GFR calc Af Amer: 82 mL/min — ABNORMAL LOW (ref >90)
GFR calc non Af Amer: 71 mL/min — ABNORMAL LOW (ref >90)
Potassium: 3.9 mEq/L (ref 3.5–5.1)

## 2013-08-08 LAB — CBC
Platelets: 211 10*3/uL (ref 150–400)
RDW: 13.4 % (ref 11.5–15.5)
WBC: 14.5 10*3/uL — ABNORMAL HIGH (ref 4.0–10.5)

## 2013-08-08 LAB — POCT I-STAT TROPONIN I: Troponin i, poc: 0 ng/mL (ref 0.00–0.08)

## 2013-08-08 MED ORDER — IOHEXOL 350 MG/ML SOLN
80.0000 mL | Freq: Once | INTRAVENOUS | Status: AC | PRN
  Administered 2013-08-08: 56 mL via INTRAVENOUS

## 2013-08-08 MED ORDER — HYDROCODONE-ACETAMINOPHEN 5-325 MG PO TABS: 2.0000 | ORAL_TABLET | ORAL | Status: DC | PRN

## 2013-08-08 NOTE — ED Notes (Signed)
Returned from CT.

## 2013-08-08 NOTE — Telephone Encounter (Signed)
I spoke with pt. She went to ED last night and was DX with pneumothorax.She is going to see a thoracic surgeon this afternoon. She is wanting to speak with MR personally about this and some other things regarding this. She states she can be reached at the # listed above in chart. Please advise MR thanks

## 2013-08-08 NOTE — ED Provider Notes (Signed)
CSN: [id number]     Arrival date & time 08/07/13  2356 History   First MD Initiated Contact with Patient 08/08/13 [phone]     Chief Complaint  Patient presents with  . Chest Pain   (Consider location/radiation/quality/duration/timing/severity/associated sxs/prior Treatment) HPI Comments: Patient is a 62 year old female with past medical history significant for pulmonary fibrosis. She presents with complaints of right-sided chest pain that has been occurring for the past several days. She states it is worse with inspiration but she denies any fevers, chills, or productive cough. She denies any leg swelling or any calf pain. It is worse with position or when she is trying to lay on her side. The pain became quite severe this evening when she was trying to sleep so she presents for evaluation.  Patient is a 62 y.o. female presenting with chest pain. The history is provided by the patient.  Chest Pain Pain location:  R chest Pain quality: sharp   Pain radiates to:  R shoulder Pain radiates to the back: yes   Pain severity:  Moderate Timing:  Intermittent Progression:  Worsening Chronicity:  New Relieved by:  Nothing Worsened by:  Deep breathing Ineffective treatments:  None tried Associated symptoms: no shortness of breath     Past Medical History  Diagnosis Date  . High cholesterol   . History of chronic bronchitis     as child  . GERD (gastroesophageal reflux disease)   . Arthritis   . Depression   . Allergy   . Asthma   . Cataract   . Pneumonitis, hypersensitivity     a. 09/2012 s/p Bx - ? 2/2 bird, mold, oil paint exposure ->on steroids, followed by pulm.  . Rapid heart rate   . DOE (dyspnea on exertion)     a. 04/2010 Lexi MV EF 71%, no ischemia/infarct;    . Hyperplastic colon polyp 2007  . Hiatal hernia   . Interstitial lung disease   . Insulin resistance     past  . Inguinal hernia     right  . Pulmonary fibrosis    Past Surgical History  Procedure Laterality Date   . Tubal ligation    . Foot fracture surgery  2006 or 2007    right  . Video bronchoscopy  11/19/2011    Procedure: VIDEO BRONCHOSCOPY WITH FLUORO;  Surgeon: Kalman Shan, MD;  Location: [facility] ENDOSCOPY;  Service: Endoscopy;;  . Hymenectomy    . Cesarean section    . Video assisted thoracoscopy  09/28/2012    Procedure: VIDEO ASSISTED THORACOSCOPY;  Surgeon: Loreli Slot, MD;  Location: [facility] OR;  Service: Thoracic;  Laterality: Right;  . Lung biopsy  09/28/2012    Procedure: LUNG BIOPSY;  Surgeon: Loreli Slot, MD;  Location: [facility] OR;  Service: Thoracic;  Laterality: N/A;  lung biopsies tims three  . Breast biopsy Right 2009    BIOPSY   Family History  Problem Relation Age of Onset  . Emphysema Maternal Grandmother   . Asthma Maternal Grandmother   . Asthma Mother   . Osteoarthritis Mother   . Dementia Mother 27  . Lymphoma Father   . Diabetes Father   . Hypertension Sister   . Heart disease Sister   . Kidney disease Sister   . Lung disease Maternal Grandfather   . Breast cancer Paternal Aunt   . Ovarian cancer Maternal Aunt   . Bone cancer Paternal Grandfather    History  Substance Use Topics  . Smoking status: Never  Smoker   . Smokeless tobacco: Never Used     Comment: pt states she experimented in college  . Alcohol Use: 1.1 oz/week    1 Glasses of wine, 1 Drinks containing 0.5 oz of alcohol per week     Comment: once a wk. 1-2 glasses   OB History   Grav Para Term Preterm Abortions TAB SAB Ect Mult Living                 Review of Systems  Respiratory: Negative for shortness of breath.   Cardiovascular: Positive for chest pain.  All other systems reviewed and are negative.    Allergies  Atorvastatin; Betadine; Codeine; Rosuvastatin; Shellfish allergy; and Sulfonamide derivatives  Home Medications   Current Outpatient Rx  Name  Route  Sig  Dispense  Refill  . acetaminophen (TYLENOL) 500 MG tablet   Oral   Take 500 mg by mouth 2 (two) times  daily.         Marland Kitchen alendronate-cholecalciferol (FOSAMAX PLUS D) 70-2800 MG-UNIT per tablet   Oral   Take 1 tablet by mouth every 7 (seven) days. On Sunday         . calcium elemental as carbonate (TUMS ULTRA 1000) 400 MG tablet   Oral   Chew 1,000 mg by mouth daily.         . Calcium-Vitamin D-Vitamin K (VIACTIV) 500-500-40 MG-UNT-MCG CHEW   Oral   Chew 1-2 tablets by mouth daily.         Marland Kitchen dicyclomine (BENTYL) 10 MG capsule   Oral   Take 10 mg by mouth 2 (two) times daily.          . fluticasone (FLONASE) 50 MCG/ACT nasal spray   Nasal   Place 2 sprays into the nose daily.          . metoprolol succinate (TOPROL-XL) 50 MG 24 hr tablet   Oral   Take 1 tablet (50 mg total) by mouth daily. Take with or immediately following a meal.   90 tablet   3   . Omega-3 Fatty Acids (FISH OIL) 875 MG CHEW   Oral   Chew 3 tablets by mouth daily.         Marland Kitchen omeprazole (PRILOSEC) 20 MG capsule   Oral   Take 1 capsule (20 mg total) by mouth daily.   30 capsule   5   . pravastatin (PRAVACHOL) 40 MG tablet   Oral   Take 20 mg by mouth daily.          . predniSONE (DELTASONE) 5 MG tablet   Oral   Take 5 mg by mouth daily.          BP 142/68  Pulse 70  Temp(Src) 97.6 F (36.4 C) (Oral)  Resp 18  SpO2 96% Physical Exam  Nursing note and vitals reviewed. Constitutional: She is oriented to person, place, and time. She appears well-developed and well-nourished. No distress.  HENT:  Head: Normocephalic and atraumatic.  Neck: Normal range of motion. Neck supple.  Cardiovascular: Normal rate and regular rhythm.  Exam reveals no gallop and no friction rub.   No murmur heard. Pulmonary/Chest: Effort normal and breath sounds normal. No respiratory distress. She has no wheezes.  There is tenderness to palpation in the right chest wall.  Abdominal: Soft. Bowel sounds are normal. She exhibits no distension. There is no tenderness.  Musculoskeletal: Normal range of motion.   Neurological: She is alert and oriented to person, place,  and time.  Skin: Skin is warm and dry. She is not diaphoretic.    ED Course  Procedures (including critical care time) Labs Review Labs Reviewed  CBC - Abnormal; Notable for the following:    WBC 14.5 (*)    All other components within normal limits  BASIC METABOLIC PANEL - Abnormal; Notable for the following:    Glucose, Bld 106 (*)    GFR calc non Af Amer 71 (*)    GFR calc Af Amer 82 (*)    All other components within normal limits  POCT I-STAT TROPONIN I   Imaging Review Dg Chest 2 View  08/08/2013   CLINICAL DATA:  Chest pain.  EXAM: CHEST  2 VIEW  COMPARISON:  Multiple priors.  FINDINGS: Lung volumes are slightly low. Extensive interstitial prominence throughout the lungs bilaterally with some associated ground-glass attenuation. Overall appearance is very similar to prior examinations, and appears to be related to underlying interstitial lung disease based on comparison with remote prior chest CT 08/16/2012. No definite superimposed consolidative airspace disease is confidently identified. No pleural effusions. No evidence of pulmonary edema. Heart size is normal. Mediastinal contours are unremarkable.  IMPRESSION: Chronic changes of interstitial lung disease redemonstrated, without evidence of acute cardiopulmonary disease.   Electronically Signed   By: Trudie Reed M.D.   On: 08/08/2013 00:51    Date: 08/08/2013  Rate: 67  Rhythm: normal sinus rhythm  QRS Axis: normal  Intervals: normal  ST/T Wave abnormalities: normal  Conduction Disutrbances:none  Narrative Interpretation:   Old EKG Reviewed: none available    MDM  No diagnosis found. Patient presents here with complaints of left-sided pleuritic chest pain. CT angiogram chest was performed to rule out pulmonary embolism. This was negative for PE however did reveal a small left-sided pneumothorax. Her oxygen saturations are adequate and she appears  clinically well. I have discussed this case with Dr. Zenaida Niece trigt from cardiothoracic surgery. He recommends discharge with followup in the office in 24 hours for a repeat chest x-ray. Patient will be discharged with pain medications and strict instructions to return if she develops increased shortness of breath, worsening pain or other problems.    Geoffery Lyons, MD 08/08/13 [phone]

## 2013-08-08 NOTE — ED Notes (Addendum)
Pt. reports right anterior ribcage pain radiating to mid back onset last night , pain worse with deep inspiration , denies SOB , no nausea or diaphoresis , pt. stated history of pulmonary fibrosis and irregular heart rate .

## 2013-08-08 NOTE — ED Notes (Signed)
Patient transported to CT 

## 2013-08-08 NOTE — Telephone Encounter (Signed)
D/w patient. She sees CVTS today. She prefers conservative approach of watching which might be reasonable I Think but I have told her to discuss with DR HEndrickson. I am available on my cell and she knows how to reach me  Dr. Kalman Shan, M.D., [facility].C.P Pulmonary and Critical Care Medicine Staff Physician [facility] Pager: [phone], If no answer or between  15:00h - 7:00h: call [phone]  08/08/2013 12:57 PM

## 2013-08-08 NOTE — Progress Notes (Signed)
HPI:  Rhonda [name] is a 62 yo woman with pulmonary fibrosis due to hypersensitivity pneumonitis. I did a right lung biopsy on her in November of 2013.  She had been doing well on prednisone for her pulmonary disease until a few days ago. She developed right-sided chest pain which radiated to the neck and jaw and shortness of breath. She says that she had been with a group of friends and after laughing very hard she developed a coughing spell. She also has been moving furniture recently which caused her to strain. She's not aware of any trauma to the right chest.  Her symptoms had been significant on Sunday, but yesterday they were better and she went back to her normal activities. Last night the pain worsened and she was short of breath when trying to lie in bed, so she went to the emergency room. A chest x-ray and CT angiogram were done. She was noted to have a pneumothorax on the CT scan. She was advised him to the office today for followup chest x-ray.  She says that the pain has eased off since she went to the emergency room. She does still have pain with deep inspiration. She does not feel short of breath at present.   Past Medical History  Diagnosis Date  . High cholesterol   . History of chronic bronchitis     as child  . GERD (gastroesophageal reflux disease)   . Arthritis   . Depression   . Allergy   . Asthma   . Cataract   . Pneumonitis, hypersensitivity     a. 09/2012 s/p Bx - ? 2/2 bird, mold, oil paint exposure ->on steroids, followed by pulm.  . Rapid heart rate   . DOE (dyspnea on exertion)     a. 04/2010 Lexi MV EF 71%, no ischemia/infarct;    . Hyperplastic colon polyp 2007  . Hiatal hernia   . Interstitial lung disease   . Insulin resistance     past  . Inguinal hernia     right  . Pulmonary fibrosis     Current Outpatient Prescriptions  Medication Sig Dispense Refill  . acetaminophen (TYLENOL) 500 MG tablet Take 500 mg by mouth 2 (two) times daily.      Marland Kitchen  alendronate-cholecalciferol (FOSAMAX PLUS D) 70-2800 MG-UNIT per tablet Take 1 tablet by mouth every 7 (seven) days. On Sunday      . calcium elemental as carbonate (TUMS ULTRA 1000) 400 MG tablet Chew 1,000 mg by mouth daily.      . Calcium-Vitamin D-Vitamin K (VIACTIV) 500-500-40 MG-UNT-MCG CHEW Chew 1-2 tablets by mouth daily.      Marland Kitchen dicyclomine (BENTYL) 10 MG capsule Take 10 mg by mouth 2 (two) times daily.       . fluticasone (FLONASE) 50 MCG/ACT nasal spray Place 2 sprays into the nose daily.       . metoprolol succinate (TOPROL-XL) 50 MG 24 hr tablet Take 1 tablet (50 mg total) by mouth daily. Take with or immediately following a meal.  90 tablet  3  . Omega-3 Fatty Acids (FISH OIL) 875 MG CHEW Chew 3 tablets by mouth daily.      Marland Kitchen omeprazole (PRILOSEC) 20 MG capsule Take 1 capsule (20 mg total) by mouth daily.  30 capsule  5  . pravastatin (PRAVACHOL) 40 MG tablet Take 20 mg by mouth daily.       . predniSONE (DELTASONE) 5 MG tablet Take 5 mg by mouth daily.      Marland Kitchen  alendronate (FOSAMAX) 70 MG tablet       . HYDROcodone-acetaminophen (NORCO) 5-325 MG per tablet Take 2 tablets by mouth every 4 (four) hours as needed for pain.  20 tablet  0  . traMADol-acetaminophen (ULTRACET) 37.5-325 MG per tablet        No current facility-administered medications for this visit.   Allergies  Allergen Reactions  . Atorvastatin Other (See Comments)    Leg pain  . Betadine [Povidone Iodine] Other (See Comments)    blisters  . Codeine Nausea And Vomiting  . Rosuvastatin Other (See Comments)    Leg pain  . Shellfish Allergy Other (See Comments)    vomiting  . Sulfonamide Derivatives Other (See Comments)    headaches     Physical Exam BP 104/69  Pulse 71  Resp 16  Ht 5' 0.5" (1.537 m)  Wt 146 lb (66.225 kg)  BMI 28.03 kg/m2  SpO69 101% 62 year old woman in no acute distress Lungs with equal course breath sounds bilaterally No subcutaneous emphysema  Diagnostic Tests: CT chest  08/08/2013  CT ANGIOGRAPHY CHEST  Technique: Multidetector CT imaging of the chest using the  standard protocol during bolus administration of intravenous  contrast. Multiplanar reconstructed images including MIPs were  obtained and reviewed to evaluate the vascular anatomy.  Contrast: 56mL OMNIPAQUE IOHEXOL 350 MG/ML SOLN  Comparison: Radiograph from earlier on the same day as well as  prior CT from 08/16/2012.  Findings: Scattered mediastinal lymph nodes measuring up to 1.2 cm  in short axis are present. These are grossly similar as compared  to the prior CT examination.  The aortic great vessels are unremarkable. The heart size within  normal limits. Diffuse three-vessel coronary artery calcifications  again noted. There is no pericardial effusion.  The pulmonary arterial tree is well opacified. No filling defects  to suggest acute pulmonary embolus are identified. Reformatted  imaging confirms these findings.  A small right-sided pneumothorax is present anteriorly (series 6,  image 54). Few foci of subcutaneous emphysema are present within  the overlying right pectoralis muscle. No rib fracture identified.  There is a small right pleural effusion with associated  atelectasis. Chronic interstitial lung disease including  architectural distortion with traction bronchiectasis and mosaic  attenuation are then seen, somewhat poorly evaluated on this  examination due to respiratory motion artifact.  Visualized upper abdomen is unremarkable.  No acute osseous abnormality identified.  IMPRESSION:  1. Right-sided pneumothorax. This finding is of uncertain  etiology, but may be related to patient's history of underlying  chronic interstitial lung disease. No evidence of traumatic injury  identified within the chest.  2. No CT evidence of acute pulmonary embolus.  3. Moderate right pleural effusion.  4. Chronic interstitial lung findings, poorly evaluated on this  examination due to  extensive respiratory motion artifact.  5. Three-vessel coronary artery disease.  6. Similar appearance of mediastinal adenopathy. Continued  attention at follow-up is recommended.  Critical Value/emergent results were called by telephone at the  time of interpretation on 08/08/2013 at 04:55 a.m. to Dr. Judd Lien, who  verbally acknowledged these results.  Original Report Authenticated By: Rise Mu, M.D.  Chest x-ray 08/08/2013 1402 p.m. *RADIOLOGY REPORT*  Clinical Data: No pneumothorax. Spontaneous pneumothorax. Follow-  up chest x-ray.  CHEST - 2 VIEW SAME DAY  Comparison: Chest CT 08/08/2013 at 0412 hours.  Findings: Small right-sided pneumothorax is faintly visualized at  the apex, just inferior to the posterior third rib.  Cardiopericardial silhouette appears within normal limits.  Bilateral perihilar airspace opacity remains present, compatible  with chronic interstitial lung disease. Small right pleural  effusion is present with associated atelectasis.  IMPRESSION:  Small right apical pneumothorax appears unchanged allowing for  differences in technique. Small right pleural effusion and chronic  interstitial lung disease also appears similar.  Original Report Authenticated By: Andreas Newport, M.D.        Impression: 62 year old woman with pulmonary fibrosis who developed a spontaneous pneumothorax. This is a very small pneumothorax. She is still having some pain although that is improving. She is not short of breath at present. No intervention is indicated at this time.  I did advise her to take it easy for the next couple of weeks. She should avoid heavy exertion and straining. She should not fly for at least couple of weeks.  She was advised to seek medical attention immediately if she becomes short of breath or experiences a significant increase in pain.  She does know that there is the possibility of the pneumothorax worsening or recurring, in which case she  might need a chest tube and/or VATS.  She will call if she develops any worsening pain or shortness of breath.

## 2013-09-07 ENCOUNTER — Ambulatory Visit (INDEPENDENT_AMBULATORY_CARE_PROVIDER_SITE_OTHER): Payer: BC Managed Care – PPO | Admitting: Radiology

## 2013-09-07 DIAGNOSIS — Z23 Encounter for immunization: Secondary | ICD-10-CM

## 2013-09-12 ENCOUNTER — Ambulatory Visit
Admission: RE | Admit: 2013-09-12 | Discharge: 2013-09-12 | Disposition: A | Discharge status: Home / Self Care | Payer: BC Managed Care – PPO | Source: Ambulatory Visit

## 2013-09-12 DIAGNOSIS — Z1231 Encounter for screening mammogram for malignant neoplasm of breast: Secondary | ICD-10-CM

## 2013-09-27 ENCOUNTER — Other Ambulatory Visit: Payer: Self-pay | Admitting: Internal Medicine

## 2013-11-01 ENCOUNTER — Ambulatory Visit: Payer: BC Managed Care – PPO | Admitting: Internal Medicine

## 2013-11-04 IMAGING — CT CT CHEST W/O CM
1 of 5 series · 4 of 36 positions shown, 5 images · non-contrast
Comparison: 10/13/2011 and 10/15/2009.

CLINICAL DATA: Clinically progressive interstitial lung disease.
Worsening hypoxia with exertion.

CT CHEST WITHOUT CONTRAST
TECHNIQUE: Multidetector CT imaging of the chest was performed
following the standard protocol without IV contrast.Dedicated high
resolution images were also performed.

[Series 602: cor · coronal · 0.66mm/px · 4 of 93 slices shown, 5 images]
[im 19/93  mediastinal]
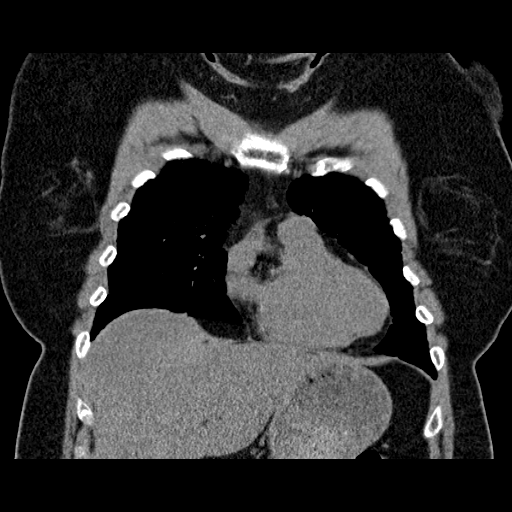
[im 19/93  lung]
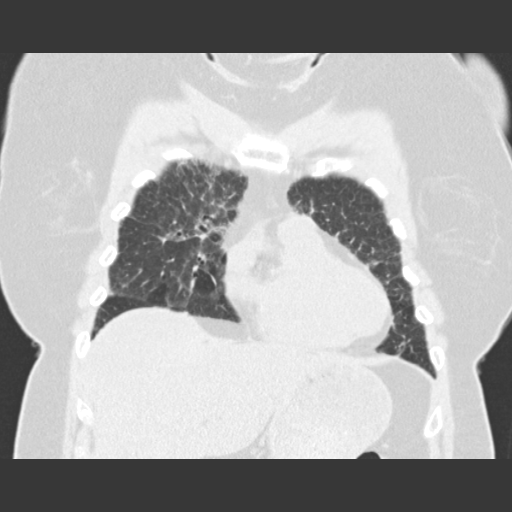
[im 37/93  lung]
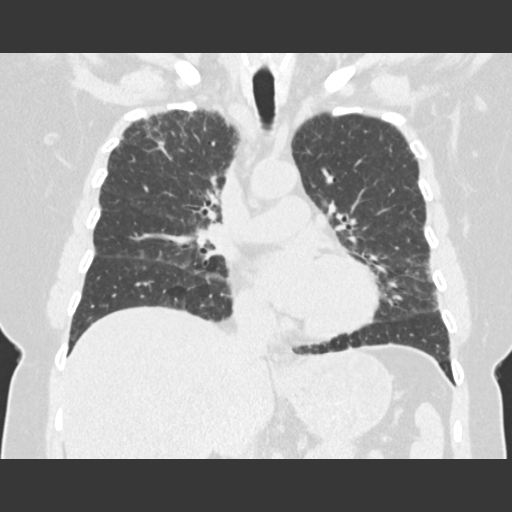
[im 56/93  lung]
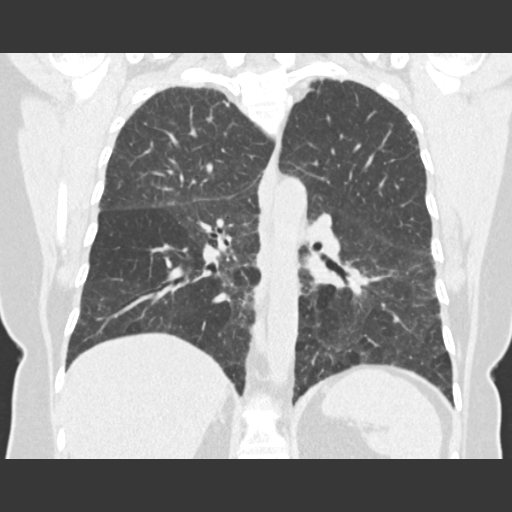
[im 74/93  lung]
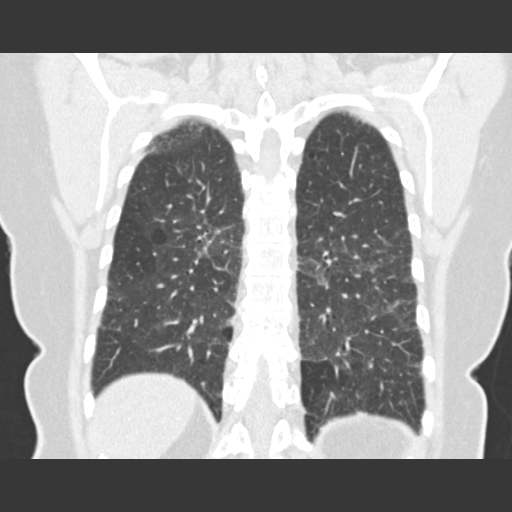

[4 of 36 positions shown; findings below may reference images not displayed]

FINDINGS: Lung windows demonstrate no airspace disease.  Similar
pattern of interstitial lung disease.  Patchy areas of
architectural distortion with minimal traction bronchiectasis and
bronchiolectasis.  Minimal subpleural reticulation.  No areas of
honeycombing.  Scattered areas of mosaic attenuation.  Examples
identified within the right upper lobe on image 23 of series 5 and
within the right lower lobe on image 25 of series 5.  No cranial
caudal gradient.

Dedicated high resolution images confirm these findings.  The areas
of mosaic attenuation are increased with expiration.  This suggests
small airways disease causing mosaic perfusion.

Soft tissue windows demonstrate normal heart size without
pericardial or pleural effusion.

Multivessel coronary artery atherosclerosis.  Right paratracheal
node which measures 6 mm and is unchanged.
A precarinal node measures 1.1 cm and is unchanged.  Hilar regions
poorly evaluated secondary lack of IV contrast.

Small hiatal hernia.

Limited abdominal imaging demonstrates moderate hepatic steatosis.
No acute osseous abnormality.  Lower thoracic discogenic sclerosis.
IMPRESSION: 1.  Redemonstration of interstitial lung disease.  No significant
change.  Consolation of findings is nonspecific.  Considerations
include nonspecific interstitial pneumonitis, atypical appearance
of usual interstitial pneumonitis, or post infectious fibrosis.
2.  No evidence of acute superimposed pulmonary process.
3.  Similar mild thoracic adenopathy, likely reactive.
4. Age advanced coronary artery atherosclerosis.  Recommend
assessment of coronary risk factors and consideration of medical
therapy.
5.  Small hiatal hernia.
6.  Hepatic steatosis.

## 2013-12-06 ENCOUNTER — Ambulatory Visit (INDEPENDENT_AMBULATORY_CARE_PROVIDER_SITE_OTHER): Payer: BC Managed Care – PPO | Admitting: Internal Medicine

## 2013-12-06 ENCOUNTER — Encounter: Payer: Self-pay | Admitting: Internal Medicine

## 2013-12-06 VITALS — BP 120/76 | HR 66 | Temp 98.0°F | Ht 64.0 in | Wt 147.0 lb

## 2013-12-06 DIAGNOSIS — J679 Hypersensitivity pneumonitis due to unspecified organic dust: Secondary | ICD-10-CM

## 2013-12-06 NOTE — Progress Notes (Signed)
Subjective:    Patient ID: Rhonda [name], female    DOB: 11-Jan-1951, 63 y.o.   MRN: [id number]  HPI   #GE reflux with small hiatal hernia  - on ppi  #MEtabolic Syndrome  - On low glycemic diet  159#  on 09/19/12 147# on 11/21/12 145# on 01/02/13 145# on 02/28/2013 147# - on 05/25/2013    Weight:        #History of rapid heart rate not otherwise specified  - Start her on Lopressor  2008/2009 by primary care physician. History appears to correlate with onset of respiratory issues  # Hypersensitivity Pneumonitis  - Potential etiologies - cockateil x 2 for 18 years through end 2012. 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 However, there is worrying trend of UIP pattern in the Upper lobes.    #Exposure Hx (retake Nov 2013)       Home exposures .: Negative for: She denies using feather pillows or using sauna or hot tub. Denies mold on clothes or wet carpets. Denies dish washer leakage. Denies musty smell in house (but this was a problem in 2011-2012) and denies hx of flooding, water damange to ceilings/walls.  No carpets in down floor. Upstairs new carpets and this not wet. Questionable exposures: [place] built.. Has sun room full of plants - occ mushrooms in this but no mold. Does have humidifier because dry air makes her cough but denies it has mold. No mildew in house/bathroom.  However, their basement is old and she says is "Weird" but they do not go there. Duct system they think should be free of mold but not sure. Positive expo:   The main exposures appear to be the birds for > 20 years which past 1 year no longer exposed to.  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 She remembers musty smell in unused barn and in  [place] last year which really triggerd her resp issues (sept 2012 admission) and needed prednisone. She does do lot of potting and work with [employer]    Drug hx:  Drug hx  She was on nitrafurantoin 5 years ago on regular basis but not in past 5 years   Occupational hx: negative for vet work, Estate manager/land agent, grain or flour processing, Radio broadcast assistant work, Tourist information centre manager work and Chemical engineer and dust exposure work and Lobbyist, Sales executive. 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 Improved crackles.  Improved dyspnea except @ hill    Fev1  1.9 L   1.7L/90%  1.66 L/87%   1.66 L/78%  1.75L/90% 1.68L/82% 1.56/75  FVC  2.6 L    2.2 L/82%   2.1 L/80%  2.19 L/82%  2.2L/91% 2.1/81% 1.86/69%  Ratio     79   76/95%  80/99% 80/100%   fef 25-75%          TLC  4.3 L    3.3 L/79%  3.5 L/82%     3.22/72%  DLCO  10/50%    10/51%   9.6/51%     11.9/63%  Walk test 185 ft x 3 laps on RA  rest 92%. Pk exertion 90%. Pk HR 108 Rest 92%. Pk exertion 86% at 2.5 laps. Pk HR 102  pk exertion - 88% Rest 96%. Pk 95%. Pk HR is 75. Done at full dose lopressor Rest 94%. Pk 91%. Rest HR 93 with pk HR 103/min. Done at half dose lopressor REst 96%. Pk pulse ox 92%. Rest HR 65. Pk HR 86 Did not desat. Normal Pk HR  CT chest   unchanged       Tests/Bx Bronch 0- non diagnostic   VATS - HP with UIP in Upper lobe      RX    Rx pred start 11/23/12 On  pred 30mg  per day and will reduce to 20mg  On Pred  20mg  per day On pred 10mg  per day                 OV 02/28/13 Full of a hypersensitivity pneumonitis with UIP pattern  She is now taking 20 mg prednisone a day. She finds that her effort tolerance is stable versus improved compared to 01/02/2013. She feels she can take deeper breaths worsening and last longer. However climbing hills still problematic. She feels this is due to her tachycardia. She says that since she cut down the Lopressor to half her normal dose at the time of last visit in February 2014 her tachycardia is worse particularly at night. She was to go back on full dose Lopressor. She is reluctant to see the cardiologist about this.  She continues to be concerned about the long-term effects of prednisone. She says her height now measures 4 feet 11 inches which is 2 inches shorter than her baseline. She has not had a bone scan there been some issues with with getting this. She therefore she is on alendronate  Of note, her main concern is a few weeks ago she woke up in the middle of the night with acute right pleuritic chest pain that was severe but improved with walking the same thing was repeated last night. Other than this there no other symptoms. There is no associated hemoptysis, edema, fever, sputum, cough. She's wondering if this is related to rib fracture or muscle pull of vertebral fracture.  REC Please have CXR Today Will call you with results to decide on cutting prednisone down Can Increase lopressor to your original dose Followup depending on cxr    OV July 2014  FU  Of HP with UIP pattern and weight mgmt  Now down to 10mg  pred a day. Doing well. OVerall no resp issues. FVC, fev1 and walking pulse ox today is stable. Only issue is still while walking hills gets dyspneic. She did check pulse ox while mountain climbing and did ddrop tp 82% and she needed > 1 minute to settle down. SHe is not interested in using o2 with mountain climbing. She wants to cut prednisone  down  In terms of weight: stable. SHe wants to lose some more weight but feels stuck. Currently Estimated body mass index is 25.22 kg/(m^2) as calculated from the following:   Height  as of this encounter: 5\' 4"  (1.626 m).   Weight as of this encounter: 66.679 kg (147 lb).     Past, Family, Social reviewed: she is frustrated Ellsworth Lennox, MD office has not set up bone densiety scan for her. Now having Rt hip pain x few months. Worse with exertion. Moderate. Unable to swim well. I spoke to [facility], MD personally and she will arrange that     [place] 12/06/2013   Chief Complaint  Patient presents with  . Follow-up    Breathing has gotten worse since the winter season started. Reports coughing, DOE. Denies chest tightness and wheezing. PFT done today.    Overall well. IN fall had spont ptx but that resolved. Then well but in dec/jan with extreme cold has been more in the house and  Noticed dry air in house producing more dyspnea, cough, and white sputum. Denies resp illness. She continues to deny home has any mold.  She continues to believe she is not exposed to any antigen at home. Reports heating system was checked for mold and was negative. Continues on prednisone 5mg  per day. PFT (see above); dLCO stable but FVC, and TLC show worsening. Vance she did not dest in office when walked. Otherwise no complaints except IBS is active  Side note: Got charged $600 for an out patient PFT done in the hospital.    Current outpatient prescriptions:acetaminophen (TYLENOL) 500 MG tablet, Take 500 mg by mouth 2 (two) times daily., Disp: , Rfl: ;  alendronate-cholecalciferol (FOSAMAX PLUS D) 70-2800 MG-UNIT per tablet, Take 1 tablet by mouth every 7 (seven) days. On Sunday, Disp: , Rfl: ;  calcium elemental as carbonate (TUMS ULTRA 1000) 400 MG tablet, Chew 1,000 mg by mouth daily., Disp: , Rfl:  Calcium-Vitamin D-Vitamin K (VIACTIV) 423-536-14 MG-UNT-MCG CHEW, Chew 1-2 tablets by mouth daily.,  Disp: , Rfl: ;  dicyclomine (BENTYL) 10 MG capsule, TAKE 1 CAPSULE (10 MG TOTAL) BY MOUTH 2 (TWO) TIMES DAILY., Disp: 60 capsule, Rfl: 2;  metoprolol succinate (TOPROL-XL) 50 MG 24 hr tablet, Take 1 tablet (50 mg total) by mouth daily. Take with or immediately following a meal., Disp: 90 tablet, Rfl: 3 Omega-3 Fatty Acids (FISH OIL) 875 MG CHEW, Chew 3 tablets by mouth daily., Disp: , Rfl: ;  omeprazole (PRILOSEC) 20 MG capsule, Take 1 capsule (20 mg total) by mouth daily., Disp: 30 capsule, Rfl: 5;  pravastatin (PRAVACHOL) 40 MG tablet, Take 20 mg by mouth daily. , Disp: , Rfl: ;  predniSONE (DELTASONE) 5 MG tablet, Take 5 mg by mouth daily., Disp: , Rfl: ;  Probiotic Product (PROBIOTIC DAILY PO), Take 1 tablet by mouth daily., Disp: , Rfl:        Review of Systems  Constitutional: Negative for fever and unexpected weight change.  HENT: Negative for congestion, dental problem, ear pain, nosebleeds, postnasal drip, rhinorrhea, sinus pressure, sneezing, sore throat and trouble swallowing.   Eyes: Negative for redness and itching.  Respiratory: Positive for cough and shortness of breath. Negative for chest tightness and wheezing.   Cardiovascular: Negative for palpitations and leg swelling.  Gastrointestinal: Negative for nausea and vomiting.  Genitourinary: Negative for dysuria.  Musculoskeletal: Negative for joint swelling.  Skin: Negative for rash.  Neurological: Negative for headaches.  Hematological: Does not bruise/bleed easily.  Psychiatric/Behavioral: Negative for dysphoric mood. The patient is not nervous/anxious.     Review of Systems  Constitutional: Negative for fever and unexpected weight change.  HENT: Negative for ear pain, nosebleeds, congestion, sore  throat, rhinorrhea, sneezing, trouble swallowing, dental problem, postnasal drip and sinus pressure.   Eyes: Negative for redness and itching.  Respiratory:INcreased dyspnea and cough Cardiovascular: Negative for palpitations and  leg swelling.  Gastrointestinal: IBS + Genitourinary: Negative for dysuria.  Musculoskeletal: Negative for joint swelling.  Skin: Negative for rash.  Neurological: Negative for headaches.  Hematological: Does not bruise/bleed easily.  Psychiatric/Behavioral: Negative for dysphoric mood. The patient is not nervous/anxious.      Objective:   Physical Exam   Vitals reviewed. Constitutional: She is oriented to person, place, and time. She appears well-developed and well-nourished. No distress.  Body mass index is 25.22 kg/(m^2).  HENT:  Head: Normocephalic and atraumatic.  Right Ear: External ear normal.  Left Ear: External ear normal.  Mouth/Throat: Oropharynx is clear and moist. No oropharyngeal exudate.  Eyes: Conjunctivae and EOM are normal. Pupils are equal, round, and reactive to light. Right eye exhibits no discharge. Left eye exhibits no discharge. No scleral icterus.  Neck: Normal range of motion. Neck supple. No JVD present. No tracheal deviation present. No thyromegaly present.  Cardiovascular: Normal rate, regular rhythm, normal heart sounds and intact distal pulses.  Exam reveals no gallop and no friction rub.   No murmur heard. Pulmonary/Chest: Effort normal. No respiratory distress. She has no wheezes. She has rales anteriorly and these are mild and definitely less than pre-prednisone but not resolved. She exhibits no tenderness.  Abdominal: Soft. Bowel sounds are normal. She exhibits no distension and no mass. There is no tenderness. There is no rebound and no guarding.  Musculoskeletal: Normal range of motion. She exhibits no edema and no tenderness.  Lymphadenopathy:    She has no cervical adenopathy.  Neurological: She is alert and oriented to person, place, and time. She has normal reflexes. No cranial nerve deficit. She exhibits normal muscle tone. Coordination normal.  Skin: Skin is warm and dry. No rash noted. She is not diaphoretic. No erythema. No pallor.   Psychiatric: She has a normal mood and affect. Her behavior is normal. Judgment and thought content normal         Assessment & Plan:

## 2013-12-06 NOTE — Progress Notes (Signed)
PFT done today. 

## 2013-12-06 NOTE — Patient Instructions (Addendum)
Breathing test some worse Let us keep an eye on this Continue to ensure no mold exposuer at home Can use humidifer as long as no leaks, no mold We can help you with o2 for travel; check out [employer] Meet my administrator Malachy Mood or Tammy  to help resolve $600 charge for PFT Followup  - 3 months with PFT and walk

## 2013-12-10 NOTE — Assessment & Plan Note (Signed)
Her PFT is worse as well as her symptoms. She thinks this is a weather issue amd due to dry heating air at home. . I am concerned that winter weather keeping her at home is exposing her to some HP antigen but she denies this possibility. I warned her that the Rx for this disease is antigen removal and asked if she could vacate her home. This is not a possiblity for her. She continues to feel that there is not antigen in her home. Also at this point she is not interested in an increase in prednisone. She wants to see how things evolve at end of winter  PLAN   Breathing test some worse Let us keep an eye on this Continue to ensure no mold exposuer at home Can use humidifer as long as no leaks, no mold We can help you with o2 for travel; check out [employer] Meet my administrator Malachy Mood or Tammy  to help resolve $600 charge for PFT Followup    - 3 months with PFT and walk   > 50% of this > 25 min visit spent in face to face counseling (15 min visit converted to 25 min)

## 2014-01-08 ENCOUNTER — Other Ambulatory Visit: Payer: Self-pay | Admitting: Internal Medicine

## 2014-01-08 ENCOUNTER — Other Ambulatory Visit: Payer: Self-pay | Admitting: Family Medicine

## 2014-01-16 ENCOUNTER — Ambulatory Visit: Payer: BC Managed Care – PPO | Admitting: Family Medicine

## 2014-02-08 ENCOUNTER — Ambulatory Visit (INDEPENDENT_AMBULATORY_CARE_PROVIDER_SITE_OTHER): Payer: BC Managed Care – PPO | Admitting: Family Medicine

## 2014-02-08 ENCOUNTER — Encounter: Payer: Self-pay | Admitting: Family Medicine

## 2014-02-08 VITALS — BP 108/68 | HR 70 | Temp 98.2°F | Resp 16 | Ht 60.5 in | Wt 149.0 lb

## 2014-02-08 DIAGNOSIS — K449 Diaphragmatic hernia without obstruction or gangrene: Secondary | ICD-10-CM

## 2014-02-08 DIAGNOSIS — E785 Hyperlipidemia, unspecified: Secondary | ICD-10-CM

## 2014-02-08 DIAGNOSIS — K219 Gastro-esophageal reflux disease without esophagitis: Secondary | ICD-10-CM

## 2014-02-08 DIAGNOSIS — Z1159 Encounter for screening for other viral diseases: Secondary | ICD-10-CM

## 2014-02-08 DIAGNOSIS — K589 Irritable bowel syndrome without diarrhea: Secondary | ICD-10-CM

## 2014-02-08 LAB — POCT GLYCOSYLATED HEMOGLOBIN (HGB A1C): Hemoglobin A1C: 5.8

## 2014-02-08 MED ORDER — SUCRALFATE 1 GM/10ML PO SUSP: 1.0000 g | Freq: Three times a day (TID) | ORAL | Status: DC

## 2014-02-08 NOTE — Patient Instructions (Addendum)
Helicobacter Pylori Antibodies Test This is a blood test which looks for a germ called Helicobacter pylori. This can also be diagnosed by a breath test or a microscopic examination of a portion (biopsy) of the small bowel. H. pylori is a germ that is found in the cells that line the stomach. It is a risk factor for stomach and small bowel ulcers, long standing inflammation of the lining of the stomach, or even ulcers that may occur in the esophagus (the canal that runs from the mouth to the stomach). This bacterium is also a factor in stomach cancer. The amount of the bacteria is found in about 10% of healthy persons younger than 65 years of age and the amount of the bacteria increases with age. Most persons with these bacteria have no symptoms; however, it is thought that when these bacteria cause ulcers, antibiotic medications can be used to help eliminate or reduce the problem.  PREPARATION FOR TEST No preparation or fasting is necessary. NORMAL FINDINGS Negative (H-pylori bacteria not present) Ranges for normal findingsmay vary among different laboratories and hospitals. You should always check with your doctor after having lab work or other tests done to discuss the meaning of your test results and whether or not your values are considered within normal limits. MEANING OF TEST  Your caregiver will go over the test results with you and discuss the importance and meaning of your results, as well as treatment options and the need for additional tests if necessary. It is your responsibility to obtain your test results. Ask the lab or department performing the test when and how you will get your results. OBTAINING THE TEST RESULTS It is your responsibility to obtain your test results. Ask the lab or department performing the test when and how you will get your results. Document Released: 12/03/2004 Document Revised: 02/01/2012 Document Reviewed: 10/20/2008 [facility] Patient Information 2014 [place],  [place].   Side effects of PRILOSEC  Include: HEADACHE, ABDOMINAL PAIN, NAUSEA, VOMITING, DIARRHEA AND FLATULENCE AND LOW MAGNESIUM ( especially with prolonged use).  I am prescribing a medication call CARAFATE (Sucralfate is the generic); it acts like Pepto-Bismol so do not use Pepto while you are trying this medication for hiatal hernia/ reflux.

## 2014-02-09 LAB — COMPLETE METABOLIC PANEL WITH GFR
ALT: 13 U/L (ref 0–35)
AST: 17 U/L (ref 0–37)
Albumin: 3.7 g/dL (ref 3.5–5.2)
Alkaline Phosphatase: 46 U/L (ref 39–117)
BUN: 19 mg/dL (ref 6–23)
CALCIUM: 8.8 mg/dL (ref 8.4–10.5)
CHLORIDE: 106 meq/L (ref 96–112)
CO2: 24 mEq/L (ref 19–32)
Creat: 0.99 mg/dL (ref 0.50–1.10)
GFR, EST AFRICAN AMERICAN: 70 mL/min
GFR, Est Non African American: 60 mL/min
Glucose, Bld: 92 mg/dL (ref 70–99)
Potassium: 3.8 mEq/L (ref 3.5–5.3)
SODIUM: 139 meq/L (ref 135–145)
TOTAL PROTEIN: 6.8 g/dL (ref 6.0–8.3)
Total Bilirubin: 0.5 mg/dL (ref 0.2–1.2)

## 2014-02-09 LAB — H. PYLORI ANTIBODY, IGG: H Pylori IgG: 2.61 {ISR} — ABNORMAL HIGH

## 2014-02-09 LAB — LIPID PANEL
Cholesterol: 210 mg/dL — ABNORMAL HIGH (ref 0–200)
HDL: 53 mg/dL (ref >39)
LDL CALC: 113 mg/dL — AB (ref 0–99)
Total CHOL/HDL Ratio: 4 Ratio
Triglycerides: 222 mg/dL — ABNORMAL HIGH (ref <150)
VLDL: 44 mg/dL — ABNORMAL HIGH (ref 0–40)

## 2014-02-09 LAB — MAGNESIUM: Magnesium: 2 mg/dL (ref 1.5–2.5)

## 2014-02-09 LAB — HEPATITIS C ANTIBODY: HCV AB: NEGATIVE

## 2014-02-10 ENCOUNTER — Encounter: Payer: Self-pay | Admitting: Family Medicine

## 2014-02-10 ENCOUNTER — Other Ambulatory Visit: Payer: Self-pay | Admitting: Family Medicine

## 2014-02-10 MED ORDER — AMOXICILL-CLARITHRO-LANSOPRAZ PO MISC: Freq: Two times a day (BID) | ORAL | Status: DC

## 2014-02-10 NOTE — Progress Notes (Signed)
S:  This 63 y.o. Cauc female is here for follow-up re: lipid disorder; nutrition not excellent during holidays but she is getting back on track. She is intolerant of high-dose statins; she is taking Pravastatin 20 mg and tolerating it w/o myalgias.  Pt has hx of IBS and is having irregular BMs, mostly loose or diarrhea. Diet modifications makes no difference. She has tried probiotics but that seems to increase symptoms. She is not taking Omeprazole for same reason. Best results w/ Pepto-Bismol. OTC Famotidine has helped hiatal hernia. Reflux symptoms flare when she stops this medication. She denies n/v/hematochezia or severe constipation. She has not had abnormal weight change or skin/hair changes. Energy level is good as she is exercising 3 times/ week.  Patient Active Problem List   Diagnosis Date Noted  . Irritable bowel syndrome (IBS) 02/08/2014  . Spontaneous pneumothorax- Right 08/08/2013  . High risk medication use 01/03/2013  . Osteopenia 10/28/2012  . Metabolic syndrome 41/28/7867  . Rapid heart rate   . Palpitations 12/16/2011  . 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 oil pain and ? mold in house 10/29/2011  . Snoring 10/29/2011  . HYPERLIPIDEMIA-MIXED 04/02/2010  . ABNORMAL EKG 04/02/2010  . DEPRESSION 03/27/2010  . MITRAL REGURGITATION 03/27/2010  . DYSPNEA ON EXERTION 03/27/2010  . Personal history of unspecified circulatory disease 03/27/2010   PMHx, Surg Hx, Soc and Fam Hx reviewed.  MEDICATIONS reconciled.  ROS: As per HPI. Negative for fatigue, diaphoresis, vision changes, CP or tightness, palpitations, SOB or DOE, cough, HA, dizziness, numbness or weakness or syncope.  O: Filed Vitals:   02/08/14 1346  BP: 108/68  Pulse: 70  Temp: 98.2 F (36.8 C)  Resp: 16   GEN: In NAD; WN, WD. HENT: [place]/AT; EOMI w/ clear conj/sclerae. EACs/TMs normal. Oroph clear and moist w/o lesions. NECK: Supple w/o LAN or TMG. COR: RRR. Normal S1 and S2. No m/g/r. LUNGSL  CTA; unlabored resp. ABD: Increased girth, soft and otherwise normal appearance.Normal BS. Mild epig tenderness w/o  rebound or guarding. No HSM or masses. No midline pulsatile mass. BACK: No CVAT. MS: MAEs; no muscle tenderness. NEURO: A&O x 3; CNs intact, Nonfocal.  A/P: HYPERLIPIDEMIA-MIXED - Continue current medication and TLCs. Plan: POCT glycosylated hemoglobin (Hb A1C), Lipid panel, COMPLETE METABOLIC PANEL WITH GFR  Irritable bowel syndrome (IBS) - Plan: Magnesium, H. pylori antibody, IgG, COMPLETE METABOLIC PANEL WITH GFR  Hiatal hernia with gastroesophageal reflux - Plan: H. pylori antibody, IgG, COMPLETE METABOLIC PANEL WITH GFR  Need for hepatitis C screening test - Plan: Hepatitis C antibody  Meds ordered this encounter  Medications         .  sucralfate (CARAFATE) 1 GM/10ML suspension    Sig: Take 10 mLs (1 g total) by mouth 4 (four) times daily -  with meals and at bedtime.    Dispense:  420 mL    Refill:  1

## 2014-02-10 NOTE — Progress Notes (Signed)
This encounter was created in error - please disregard.

## 2014-02-13 ENCOUNTER — Telehealth: Payer: Self-pay | Admitting: Family Medicine

## 2014-02-13 NOTE — Telephone Encounter (Signed)
I spoke w/ pt to let her know that the H. Pylori Ab test was +. I have prescribed a Prevpac and explained how she should take it. Diarrhea lessened after she stopped Prilosec. She did not tolerate first few doses of Carafate so she is taking TUMS and Maalox.  I advised that she take only the medications I have prescribed  (stop TUMS and Maalox) and try taking Carafate at bedtime only. Other lab results reviewed. I will have LAB pool send pt copy of labs in mail. She will follow-up in office in 2 weeks.

## 2014-03-01 ENCOUNTER — Ambulatory Visit: Payer: Self-pay | Admitting: Family Medicine

## 2014-03-06 ENCOUNTER — Encounter: Payer: Self-pay | Admitting: Family Medicine

## 2014-03-06 ENCOUNTER — Ambulatory Visit (INDEPENDENT_AMBULATORY_CARE_PROVIDER_SITE_OTHER): Payer: Medicare Other | Admitting: Family Medicine

## 2014-03-06 VITALS — BP 108/60 | HR 69 | Temp 98.8°F | Resp 16 | Ht 60.25 in | Wt 149.6 lb

## 2014-03-06 DIAGNOSIS — K219 Gastro-esophageal reflux disease without esophagitis: Secondary | ICD-10-CM

## 2014-03-06 DIAGNOSIS — K449 Diaphragmatic hernia without obstruction or gangrene: Secondary | ICD-10-CM

## 2014-03-06 NOTE — Progress Notes (Signed)
S:  This 63 y.o. 51 female returns for follow-up after completing oral therapy for H. Pylori infection ( IgG antibody = 2.61). Pt completed PREVPAC and immediately felt better; diarrhea resolved and reflux disappeared. She finished treatment kit about 10 days ago. She felt good for a few days then reflux symptoms recurred. She reports that she took no GI medications after completing kit. Reflux continued but burning resolved once she started taking OTC Prilosec. She is concerned about chronicity of GERD and presence of hiatal hernia which has never been evaluated by GI.  Because of her pulmonary disease and chronic steroid use, she was prescribed Fosamax by Dr. Charlestine Night. Her stools remain normal; she takes Lactaid w/ dairy products. She is not taking Carafate; it did not seem to be effective.  Patient Active Problem List   Diagnosis Date Noted  . Irritable bowel syndrome (IBS) 02/08/2014  . Spontaneous pneumothorax- Right 08/08/2013  . High risk medication use 01/03/2013  . Osteopenia 10/28/2012  . Metabolic syndrome 40/81/4481  . Rapid heart rate   . Palpitations 12/16/2011  . 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 oil pain and ? mold in house 10/29/2011  . Snoring 10/29/2011  . HYPERLIPIDEMIA-MIXED 04/02/2010  . ABNORMAL EKG 04/02/2010  . DEPRESSION 03/27/2010  . MITRAL REGURGITATION 03/27/2010  . DYSPNEA ON EXERTION 03/27/2010  . Personal history of unspecified circulatory disease 03/27/2010    Prior to Admission medications   Medication Sig Start Date End Date Taking? Authorizing Provider  acetaminophen (TYLENOL) 500 MG tablet Take 500 mg by mouth 2 (two) times daily.   Yes Historical Provider, MD  alendronate-cholecalciferol (FOSAMAX PLUS D) 70-2800 MG-UNIT per tablet Take 1 tablet by mouth every 7 (seven) days. On Sunday   Yes Historical Provider, MD  metoprolol succinate (TOPROL-XL) 50 MG 24 hr tablet Take 1 tablet (50 mg total) by mouth daily. Take with or immediately  following a meal. 03/21/13  Yes Barton Fanny, MD  omeprazole (PRILOSEC) 20 MG capsule Take 1 capsule (20 mg total) by mouth daily. 03/09/13  Yes Barton Fanny, MD  pravastatin (PRAVACHOL) 40 MG tablet Take 20 mg by mouth daily.  03/09/13  Yes Historical Provider, MD  predniSONE (DELTASONE) 5 MG tablet Take 5 mg by mouth daily.   Yes Historical Provider, MD  [place] taking daily   Yes Historical Provider, MD  Calcium-Vitamin D-Vitamin K (VIACTIV) [phone] MG-UNT-MCG CHEW Chew 1-2 tablets by mouth daily.    Historical Provider, MD  Probiotic Product (PROBIOTIC DAILY PO) Take 1 tablet by mouth daily.    Historical Provider, MD  sucralfate (CARAFATE) 1 GM/10ML suspension Take 10 mLs (1 g total) by mouth 4 (four) times daily -  with meals and at bedtime. 02/08/14   Barton Fanny, MD   PMHX, Surg Hx, Soc and Fam Hx reviewed.  ROS; As per HPI; negative for abnormal weight loss, fatigue, fever/chills, anorexia, sore throat, mouth ulcers, vision changes, n/v/constipation, bloating, hematochezia, melena, rashes, myalgias, arthralgias, HA, weakness or syncope.  O: Filed Vitals:   03/06/14 1555  BP: 108/60  Pulse: 69  Temp: 98.8 F (37.1 C)  Resp: 16   GEN: In NAD; WN,WD. HENT: [place]/AT; EOMI w/ clear conj/sclerae. Otherwise unremarkable. COR: RRR. LUNGS: Unlabored resp. SKIN: W&D; intact w/o erythema, jaundice or pallor. MS: MAEs; no deformities, c/c/e. NEURO: A&O x 3; CNs intact. Nonfocal.  A/P: GERD (gastroesophageal reflux disease) - Continue Prilosec; explained to pt why this PPI helps reduce symptoms of reflux (burning) but  is not a cure. Needs esophagus evaluated due to chronicity of reflux (rule out Barrett's esophagitis as well as other pathology). Plan: Ambulatory referral to Gastroenterology  Hiatal hernia - Pt interested in having hernia fixed if possible. She will discuss this w/ specialist.        Plan: Ambulatory referral to  Gastroenterology

## 2014-03-06 NOTE — Patient Instructions (Signed)
I have referred you to Dr. Olevia Perches for EGD - to look down into your esophagus to evaluate reflux and hiatal hernia status. Continue taking Prilosec and limiting irritating foods.   Check your records to see if you have had the Pneumovax (pneumonia vaccine).

## 2014-03-08 ENCOUNTER — Encounter: Payer: Self-pay | Admitting: *Deleted

## 2014-03-09 ENCOUNTER — Other Ambulatory Visit: Payer: Self-pay | Admitting: Family Medicine

## 2014-03-15 ENCOUNTER — Other Ambulatory Visit: Payer: Self-pay | Admitting: Internal Medicine

## 2014-03-15 ENCOUNTER — Ambulatory Visit (INDEPENDENT_AMBULATORY_CARE_PROVIDER_SITE_OTHER): Payer: Medicare Other | Admitting: Internal Medicine

## 2014-03-15 ENCOUNTER — Encounter: Payer: Self-pay | Admitting: Internal Medicine

## 2014-03-15 ENCOUNTER — Ambulatory Visit: Payer: Medicare Other | Admitting: Internal Medicine

## 2014-03-15 ENCOUNTER — Encounter (INDEPENDENT_AMBULATORY_CARE_PROVIDER_SITE_OTHER): Payer: Self-pay

## 2014-03-15 ENCOUNTER — Telehealth: Payer: Self-pay | Admitting: Internal Medicine

## 2014-03-15 VITALS — BP 120/82 | HR 66 | Ht 60.0 in | Wt 151.0 lb

## 2014-03-15 DIAGNOSIS — R0989 Other specified symptoms and signs involving the circulatory and respiratory systems: Principal | ICD-10-CM

## 2014-03-15 DIAGNOSIS — J679 Hypersensitivity pneumonitis due to unspecified organic dust: Secondary | ICD-10-CM

## 2014-03-15 DIAGNOSIS — Z23 Encounter for immunization: Secondary | ICD-10-CM

## 2014-03-15 DIAGNOSIS — R0609 Other forms of dyspnea: Secondary | ICD-10-CM

## 2014-03-15 LAB — PULMONARY FUNCTION TEST
DL/VA % pred: 90 %
DL/VA: 3.85 ml/min/mmHg/L
DLCO UNC % PRED: 62 %
DLCO UNC: 11.7 ml/min/mmHg
FEF 25-75 Post: 2.58 L/sec
FEF 25-75 Pre: 2.05 L/sec
FEF2575-%Change-Post: 25 %
FEF2575-%Pred-Post: 135 %
FEF2575-%Pred-Pre: 108 %
FEV1-%Change-Post: 5 %
FEV1-%PRED-POST: 82 %
FEV1-%Pred-Pre: 78 %
FEV1-POST: 1.69 L
FEV1-Pre: 1.61 L
FEV1FVC-%Change-Post: 4 %
FEV1FVC-%Pred-Pre: 109 %
FEV6-%Change-Post: 0 %
FEV6-%PRED-PRE: 74 %
FEV6-%Pred-Post: 74 %
FEV6-PRE: 1.91 L
FEV6-Post: 1.91 L
FEV6FVC-%PRED-POST: 104 %
FEV6FVC-%PRED-PRE: 104 %
FVC-%Change-Post: 0 %
FVC-%PRED-POST: 71 %
FVC-%Pred-Pre: 71 %
FVC-PRE: 1.91 L
FVC-Post: 1.91 L
PRE FEV6/FVC RATIO: 100 %
Post FEV1/FVC ratio: 88 %
Post FEV6/FVC ratio: 100 %
Pre FEV1/FVC ratio: 84 %
RV % PRED: 58 %
RV: 1.11 L
TLC % pred: 68 %
TLC: 3.06 L

## 2014-03-15 NOTE — Assessment & Plan Note (Signed)
#  ILD /Pulmonary Fibrosis due to Chronic Hypersensitivity Pneumonitis Breathing test appears stable We discussed transplant as an option but I respect your sentiment not to entertain the possibility right now Continue to ensure no mold exposure at home Continue prednisone at 5mg  per day As discussed acid reflux can make ILD/Pulmonary Fibrosis worse  - Continue prilosec daily  - d/w Dr Delfin Edis about surgical repair for hiatal hernia; if candidate would recommend it  - stop fosfamax because this can make acid reflux worse Have PREVNAR vaccine today Discuss with PCP Ellsworth Lennox, MD about shingles vaccine  #FOllowup - FUll PFT with June Leap in 8 months  - REturn to see me in 8 months or sooner if needed    > 50% of this > 25 min visit spent in face to face counseling (15 min visit converted to 25 min)

## 2014-03-15 NOTE — Patient Instructions (Addendum)
#  ILD /Pulmonary Fibrosis due to Chronic Hypersensitivity Pneumonitis Breathing test appears stable We discussed transplant as an option but I respect your sentiment not to entertain the possibility right now Continue to ensure no mold exposure at home Continue prednisone at 5mg  per day As discussed acid reflux can make ILD/Pulmonary Fibrosis worse  - Continue prilosec daily  - d/w Dr Delfin Edis about surgical repair for hiatal hernia; if candidate would recommend it  - stop fosfamax because this can make acid reflux worse Have PREVNAR vaccine today Discuss with PCP MCPHERSON,BARBARA, MD about shingles vaccine  #FOllowup - FUll PFT with June Leap in 8 months  - REturn to see me in 8 months or sooner if needed

## 2014-03-15 NOTE — Telephone Encounter (Signed)
Hi Rhonda [name]  ikf you think her hiatal hernia is significant she should have surgery due to her interstitial lung disease  Thanks  Dr. Brand Males, M.D., [facility].C.P Pulmonary and Critical Care Medicine Staff Physician [facility] [facility] Pager: [phone], If no answer or between  15:00h - 7:00h: call [phone]  03/15/2014 11:07 PM

## 2014-03-15 NOTE — Progress Notes (Signed)
PFT done today. 

## 2014-03-15 NOTE — Progress Notes (Signed)
Subjective:    Patient ID: Rhonda [name], female    DOB: 11/03/1951, 63 y.o.   MRN: [id number]  HPI    #GE reflux with small hiatal hernia  - on ppi  #MEtabolic Syndrome  - On low glycemic diet  159#  on 09/19/12 147# on 11/21/12 145# on 01/02/13 145# on 02/28/2013 147# - on 05/25/2013 Filed Weights   03/15/14 1624  Weight: 151 lb (68.493 kg)        #History of rapid heart rate not otherwise specified  - Start her on Lopressor  2008/2009 by primary care physician. History appears to correlate with onset of respiratory issues  # Hypersensitivity Pneumonitis  - Potential etiologies - cockateil x 2 for 18 years through end 2012. 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 Denies mold but lives in house built in 1936 with a "weird baselment: and has humidifier  - first noted on CT chest 10/15/09 following trip to [place] (PE negative) but not described in 2003 CT chest report  - autoimmune panel: 10/23/11: Negative  -  Uderwennt bronch 11/19/11  - non diagnostic  - VATS Nov 2013 (done after initially refusing and strongly counseling her to undergo VATS) - CRONIC HYPERSENSITIVITY PNEUMONITIS WITH GRANULOMA. However, there is worrying trend of UIP pattern in the Upper lobes.        Oct/Nov 2012  March 2013 08/11/12  Nov/Dec 2013 Dx HP/UIP 01/02/2013  02/28/13 05/25/2013  12/06/2013  03/15/2014   Symp/Signs Dyspnea x5y  New crackles  dimished dyspnea except at hill. Improved crackles.  Improved dyspnea except @ hill     Fev1  1.9 L   1.7L/90%  1.66 L/87%   1.66 L/78%  1.75L/90% 1.68L/82% 1.56/75 1.6L/78%  FVC  2.6 L    2.2 L/82%   2.1 L/80%   2.19 L/82%  2.2L/91% 2.1/81% 1.86/69% 1.9L/71%  Ratio     79   76/95%  80/99% 80/100%  84  fef 25-75%           TLC  4.3 L    3.3 L/79%  3.5 L/82%     3.22/72% 3.06L/68%  DLCO  10/50%    10/51%   9.6/51%     11.9/63% 11.7/62%  Walk test 185 ft x 3 laps on RA  rest 92%. Pk exertion 90%. Pk HR 108 Rest 92%. Pk exertion 86% at 2.5 laps. Pk HR 102   pk exertion - 88% Rest 96%. Pk 95%. Pk HR is 75. Done at full dose lopressor Rest 94%. Pk 91%. Rest HR 93 with pk HR 103/min. Done at half dose lopressor REst 96%. Pk pulse ox 92%. Rest HR 65. Pk HR 86 Did not desat. Normal Pk HR   CT chest   unchanged        Tests/Bx Bronch 0- non diagnostic   VATS - HP with UIP in Upper lobe       RX    Rx pred start 11/23/12 On  pred 30mg  per day and will reduce to 20mg  On Pred 20mg  per day On pred 10mg  per day On pred 5mg  per day                 OV 03/15/2014  Chief Complaint  Patient presents with  . Follow-up    Review PFT and 6MW test. Pt states she has noticed some wheezing on and off recently. She feels this may be due to pollen.     Followup interstitial lung disease/pulmonary fibrosis due to  chronic hypersensitivity pneumonitis with UIP pattern  Since last visit her dyspnea continues to be stable. There is no worsening of symptoms associate from the respiratory front. Pulmonary function test documented in the table about is stable. 6 GERD walk test is good without any desaturation.  Today's visit focused on several issues -One is the future for her interstitial lung disease. She inquired about stem cell transplant which I do nothing about. I did discuss that there was potential that her interstitial lung disease could worsen because of the UIP pattern on her lung biopsy which has overlap with idiopathic pulmonary fibrosis. At this point increase steroids versus lung transplant would be an option. I also discussed the possibility of changing a diagnosis of idiopathic pulmonary fibrosis and trying to obtain one of the new drugs in the Pirfenidone or OFEV. We discussed these drugs. At this point in time due to continued stability she wants to maintain herself on Famvir and prednisone.  She continues to have significant GI issues. She recently finished her antibiotic course for H. pylori and this helped her significantly. She does have hiatal hernia and  is due to see Dr. Narda Rutherford soon. She did discuss with her the fact acid reflux can make interstitial lung disease worse. In the foot would be best for her to stop Fosamax especially now that she osteoporosis is been ruled out. Also I advocated fundoplication for hiatal hernia if Dr. Maurene Capes thought this was the cause of her GI reflux symptoms. There is anecdotal evidence that Nissen fundoplication can be pulmonary protective for interstitial lung disease  She wants to  have Prevnar vaccine today   Resting pulse ox of 95% with a heart rate of 76. At the end of 66 minute about the pulse ox was 93% without desaturation with a heart rate of 84 per minute.. 2 minutes later pulse ox was 96%. With a heart rate of 76 her minute. She felt wheezy towards the end. Distance covered was 397.5 m   Review of Systems  Constitutional: Negative for fever and unexpected weight change.  HENT: Negative for congestion, dental problem, ear pain, nosebleeds, postnasal drip, rhinorrhea, sinus pressure, sneezing, sore throat and trouble swallowing.   Eyes: Negative for redness and itching.  Respiratory: Positive for wheezing. Negative for cough, chest tightness and shortness of breath.   Cardiovascular: Negative for palpitations and leg swelling.  Gastrointestinal: Negative for nausea and vomiting.  Genitourinary: Negative for dysuria.  Musculoskeletal: Negative for joint swelling.  Skin: Negative for rash.  Neurological: Negative for headaches.  Hematological: Does not bruise/bleed easily.  Psychiatric/Behavioral: Negative for dysphoric mood. The patient is not nervous/anxious.        Objective:   Physical Exam  Vitals reviewed. Constitutional: She is oriented to person, place, and time. She appears well-developed and well-nourished. No distress.  HENT:  Head: Normocephalic and atraumatic.  Right Ear: External ear normal.  Left Ear: External ear normal.  Mouth/Throat: Oropharynx is clear and moist. No  oropharyngeal exudate.  Eyes: Conjunctivae and EOM are normal. Pupils are equal, round, and reactive to light. Right eye exhibits no discharge. Left eye exhibits no discharge. No scleral icterus.  Neck: Normal range of motion. Neck supple. No JVD present. No tracheal deviation present. No thyromegaly present.  Cardiovascular: Normal rate, regular rhythm, normal heart sounds and intact distal pulses.  Exam reveals no gallop and no friction rub.   No murmur heard. Pulmonary/Chest: Effort normal. No respiratory distress. She has no wheezes. She has rales. She  exhibits no tenderness.  Abdominal: Soft. Bowel sounds are normal. She exhibits no distension and no mass. There is no tenderness. There is no rebound and no guarding.  Musculoskeletal: Normal range of motion. She exhibits no edema and no tenderness.  Lymphadenopathy:    She has no cervical adenopathy.  Neurological: She is alert and oriented to person, place, and time. She has normal reflexes. No cranial nerve deficit. She exhibits normal muscle tone. Coordination normal.  Skin: Skin is warm and dry. No rash noted. She is not diaphoretic. No erythema. No pallor.  Psychiatric: She has a normal mood and affect. Her behavior is normal. Judgment and thought content normal.          Assessment & Plan:  ILD /Pulmonary Fibrosis due to Chronic Hypersensitivity Pneumonitis Breathing test appears stable We discussed transplant as an option but I respect your sentiment not to entertain the possibility right now Continue to ensure no mold exposure at home Continue prednisone at 5mg  per day As discussed acid reflux can make ILD/Pulmonary Fibrosis worse  - Continue prilosec daily  - d/w Dr Delfin Edis about surgical repair for hiatal hernia; if candidate would recommend it  - stop fosfamax because this can make acid reflux worse Have PREVNAR vaccine today Discuss with PCP MCPHERSON,BARBARA, MD about shingles vaccine  #FOllowup - FUll PFT with  June Leap in 8 months  - REturn to see me in 8 months or sooner if needed

## 2014-03-16 NOTE — Telephone Encounter (Signed)
Thanks a lot Sydell Axon

## 2014-03-16 NOTE — Telephone Encounter (Signed)
Murali, thanks, I will review the case today and let you know. Sydell Axon

## 2014-03-16 NOTE — Telephone Encounter (Signed)
Patient has already been scheduled

## 2014-03-16 NOTE — Telephone Encounter (Signed)
I have reviewed her chart. The only OV was 05/2013 for rectal problems/colonoscopy. I wuill see her in the office with regards to GERD/EGD/?Nissen feasibility. Thanx Regina, please ,set up an appointment if it has not been done by Dr Leward Quan already.Thanx DB

## 2014-03-22 NOTE — Progress Notes (Signed)
Patient ID: Rhonda [name], female   DOB: 1951-08-11, 63 y.o.   MRN: [id number] 6 min walk done

## 2014-04-10 ENCOUNTER — Ambulatory Visit (INDEPENDENT_AMBULATORY_CARE_PROVIDER_SITE_OTHER): Payer: Medicare Other | Admitting: Internal Medicine

## 2014-04-10 ENCOUNTER — Encounter: Payer: Self-pay | Admitting: Internal Medicine

## 2014-04-10 VITALS — BP 100/60 | HR 66 | Ht 60.5 in | Wt 151.8 lb

## 2014-04-10 DIAGNOSIS — K219 Gastro-esophageal reflux disease without esophagitis: Secondary | ICD-10-CM

## 2014-04-10 NOTE — Progress Notes (Signed)
Rhonda [name] 15-Apr-1951 [id number]  Note: This dictation was prepared with Dragon digital system. Any transcriptional errors that result from this procedure are unintentional.   History of Present Illness:  This is a 63 year old white female with a history of gastroesophageal reflux. We saw her in June 2014 for evaluation of irritable bowel syndrome and rectal nodule. Colonoscopy was essentially normal. She has had chronic exposure to steroids  for interstitial lung disease. She is currently on prednisone 5 mg a day.She was found to be H.Pylori positive for IgG antibody and was treated with  Prevpak. The treatment was also effective in treating her diarrhea.. She has been on Prilosec 20 mg daily with complete relief of her symptoms. She denies dysphagia, hoarseness but has occasional cough. CT scan of the chest showed a hiatal hernia. She was on Fosamax but this was discontinued because of her digestive problems. She is currently taking  TUMS    Past Medical History  Diagnosis Date  . High cholesterol   . History of chronic bronchitis     as child  . GERD (gastroesophageal reflux disease)   . Arthritis   . Depression   . Allergy   . Asthma   . Cataract   . Pneumonitis, hypersensitivity     a. 09/2012 s/p Bx - ? 2/2 bird, mold, oil paint exposure ->on steroids, followed by pulm.  . Rapid heart rate   . DOE (dyspnea on exertion)     a. 04/2010 Lexi MV EF 71%, no ischemia/infarct;    . Hyperplastic colon polyp 2007  . Hiatal hernia   . Interstitial lung disease   . Insulin resistance     past  . Inguinal hernia     right  . Pulmonary fibrosis   . Helicobacter pylori ab+     Past Surgical History  Procedure Laterality Date  . Tubal ligation    . Foot fracture surgery  2006 or 2007    right  . Video bronchoscopy  11/19/2011    Procedure: VIDEO BRONCHOSCOPY WITH FLUORO;  Surgeon: Brand Males, MD;  Location[facility] ENDOSCOPY;  Service: Endoscopy;;  . Hymenectomy    .  Cesarean section    . Video assisted thoracoscopy  09/28/2012    Procedure: VIDEO ASSISTED THORACOSCOPY;  Surgeon: Melrose Nakayama, MD;  Location: [facility];  Service: Thoracic;  Laterality: Right;  . Lung biopsy  09/28/2012    Procedure: LUNG BIOPSY;  Surgeon: Melrose Nakayama, MD;  Location: [facility];  Service: Thoracic;  Laterality: N/A;  lung biopsies tims three  . Breast biopsy Right 2009    BIOPSY    Allergies  Allergen Reactions  . Atorvastatin Other (See Comments)    Leg pain  . Betadine [Povidone Iodine] Other (See Comments)    blisters  . Codeine Nausea And Vomiting  . Rosuvastatin Other (See Comments)    Leg pain  . Shellfish Allergy Other (See Comments)    vomiting  . Sulfonamide Derivatives Other (See Comments)    headaches    Family history and social history have been reviewed.  Review of Systems: Denies dysphagia. Positive for exertional shortness of breath  The remainder of the 10 point ROS is negative except as outlined in the H&P  Physical Exam: General Appearance Well developed, in no distress Eyes  Non icteric  HEENT  Non traumatic, normocephalic  Mouth No lesion, tongue papillated, no cheilosis Neck Supple without adenopathy, thyroid not enlarged, no carotid bruits, no JVD Lungs Clear to auscultation  bilaterally COR Normal S1, normal S2, regular rhythm, no murmur, quiet precordium Abdomen soft nontender with normoactive bowel sounds. No distention. No tympany. Rectal not done Extremities  No pedal edema Skin No lesions Neurological Alert and oriented x 3 Psychological Normal mood and affect  Assessment and Plan:   Problem #1 Interstitial lung disease. She has gastroesophageal reflux and tested positive for IgG H. pylori antibody. Her diarrhea and symptoms of IBS subsided after a 14 day course of Prevpak. Fosamax was discontinued to minimize the effect on the esophagus. We will proceed with an upper endoscopy and biopsies for H. pylori and will also  get esophageal biopsies to rule out Barrett's esophagus. I advised her to follow antireflux measures which would include elevation of the head of the bed. She needs to continue on prednisone 5 mg a day for lung disease. We will check her for possible Candida esophagitis.  Problem #2 Colorectal screening. Her last colonoscopy in July 2014 was essentially normal.    Lafayette Dragon 04/10/2014

## 2014-04-10 NOTE — Patient Instructions (Addendum)
You have been scheduled for an endoscopy with propofol. Please follow written instructions given to you at your visit today. If you use inhalers (even only as needed), please bring them with you on the day of your procedure. Your physician has requested that you go to www.startemmi.com and enter the access code given to you at your visit today. This web site gives a general overview about your procedure. However, you should still follow specific instructions given to you by our office regarding your preparation for the procedure.  CC: Dr Ellsworth Lennox, Dr Chase Caller

## 2014-04-18 ENCOUNTER — Ambulatory Visit (AMBULATORY_SURGERY_CENTER): Payer: Medicare Other | Admitting: Internal Medicine

## 2014-04-18 ENCOUNTER — Other Ambulatory Visit: Payer: Self-pay | Admitting: Family Medicine

## 2014-04-18 ENCOUNTER — Encounter: Payer: Self-pay | Admitting: Internal Medicine

## 2014-04-18 VITALS — BP 123/62 | HR 67 | Temp 98.1°F | Resp 22 | Ht 60.5 in | Wt 151.0 lb

## 2014-04-18 DIAGNOSIS — K2940 Chronic atrophic gastritis without bleeding: Secondary | ICD-10-CM

## 2014-04-18 DIAGNOSIS — K219 Gastro-esophageal reflux disease without esophagitis: Secondary | ICD-10-CM

## 2014-04-18 MED ORDER — SODIUM CHLORIDE 0.9 % IV SOLN: 500.0000 mL | INTRAVENOUS | Status: DC

## 2014-04-18 NOTE — Progress Notes (Signed)
Called to room to assist during endoscopic procedure.  Patient ID and intended procedure confirmed with present staff. Received instructions for my participation in the procedure from the performing physician.  

## 2014-04-18 NOTE — Progress Notes (Signed)
I looked at pt's lip and there was not any bleeding but a little swollen bottom right inner lip.  She ate ice and it was soothing.  No other complaints at discahrge. Maw

## 2014-04-18 NOTE — Patient Instructions (Signed)
YOU HAD AN ENDOSCOPIC PROCEDURE TODAY AT [facility]: Refer to the procedure report that was given to you for any specific questions about what was found during the examination.  If the procedure report does not answer your questions, please call your gastroenterologist to clarify.  If you requested that your care partner not be given the details of your procedure findings, then the procedure report has been included in a sealed envelope for you to review at your convenience later.  YOU SHOULD EXPECT: Some feelings of bloating in the abdomen. Passage of more gas than usual.  Walking can help get rid of the air that was put into your GI tract during the procedure and reduce the bloating. If you had a lower endoscopy (such as a colonoscopy or flexible sigmoidoscopy) you may notice spotting of blood in your stool or on the toilet paper. If you underwent a bowel prep for your procedure, then you may not have a normal bowel movement for a few days.  DIET: Your first meal following the procedure should be a light meal and then it is ok to progress to your normal diet.  A half-sandwich or bowl of soup is an example of a good first meal.  Heavy or fried foods are harder to digest and may make you feel nauseous or bloated.  Likewise meals heavy in dairy and vegetables can cause extra gas to form and this can also increase the bloating.  Drink plenty of fluids but you should avoid alcoholic beverages for 24 hours.  ACTIVITY: Your care partner should take you home directly after the procedure.  You should plan to take it easy, moving slowly for the rest of the day.  You can resume normal activity the day after the procedure however you should NOT DRIVE or use heavy machinery for 24 hours (because of the sedation medicines used during the test).    SYMPTOMS TO REPORT IMMEDIATELY: A gastroenterologist can be reached at any hour.  During normal business hours, 8:30 AM to 5:00 PM Monday through Friday,  call [phone].  After hours and on weekends, please call the GI answering service at [phone] who will take a message and have the physician on call contact you.     Following upper endoscopy (EGD)  Vomiting of blood or coffee ground material  New chest pain or pain under the shoulder blades  Painful or persistently difficult swallowing  New shortness of breath  Fever of 100F or higher  Black, tarry-looking stools  FOLLOW UP: If any biopsies were taken you will be contacted by phone or by letter within the next 1-3 weeks.  Call your gastroenterologist if you have not heard about the biopsies in 3 weeks.  Our staff will call the home number listed on your records the next business day following your procedure to check on you and address any questions or concerns that you may have at that time regarding the information given to you following your procedure. This is a courtesy call and so if there is no answer at the home number and we have not heard from you through the emergency physician on call, we will assume that you have returned to your regular daily activities without incident.  SIGNATURES/CONFIDENTIALITY: You and/or your care partner have signed paperwork which will be entered into your electronic medical record.  These signatures attest to the fact that that the information above on your After Visit Summary has been reviewed and is understood.    Full responsibility of the confidentiality of this discharge information lies with you and/or your care-partner.     Handout was given to your care partner on GERD. Await pathology results. Continue taking prilosec daily. You may resume your other current medications today. Please call if any questions or concerns.

## 2014-04-18 NOTE — Progress Notes (Signed)
Pt. Has interstitial lung disease. States sats are between 82(with exertion) 96 normally.

## 2014-04-18 NOTE — Progress Notes (Signed)
A/ox3, pleased with MAC, report to RN 

## 2014-04-18 NOTE — Progress Notes (Signed)
Dr. Olevia Perches was talking with the pt and her husband was the delay in discharge. Maw

## 2014-04-18 NOTE — Progress Notes (Signed)
Pt complaints of a headache and her bottom lip being sore.  She said it "feels like I bit my bottom lip".  I examinied it and did not see any redness or bleeding noted.  I advised her she cough throughout the egd exam.  I asked her how much she drank today.  She said "14 oz of liquid today".  I advised her I would let the rest of the IV fluid infuse IV.  She may be a little dehydrated and also the cough may have caused a headache as well.  maw

## 2014-04-18 NOTE — Op Note (Signed)
[facility]  [address]. [place], [address]   ENDOSCOPY PROCEDURE REPORT  PATIENT: Rhonda, [name]  MR#: [id number] BIRTHDATE: 1951/02/06 , 9  yrs. old GENDER: Female ENDOSCOPIST: Lafayette Dragon, MD REFERRED BY:  Ellsworth Lennox, M.D. PROCEDURE DATE:  04/18/2014 PROCEDURE:  EGD w/ biopsy ASA CLASS:     Class III INDICATIONS:  Heartburn.   Therapy of esophageal reflux.   hiatal hernia on CT scan of the chest.  Positive IgG antibody for H. pylori.  The patient was treated with prevpak,, she has an interstitial lung disease which is steroid dependent.  Chronic gastroesophageal reflux. MEDICATIONS: MAC sedation, administered by CRNA and propofol (Diprivan) 200mg  IV TOPICAL ANESTHETIC: none  DESCRIPTION OF PROCEDURE: After the risks benefits and alternatives of the procedure were thoroughly explained, informed consent was obtained.  The LB [id number] endoscope was introduced through the mouth and advanced to the second portion of the duodenum. Without limitations.  The instrument was slowly withdrawn as the mucosa was fully examined.      [Esophagus, proximal and mid and distal esophageal mucosa was unremarkable. There was no stricture or esophagitis. The Z line was regular Stomach: There was a small reducible hiatal hernia measuring 1-2 cm. Gastric folds were normal gastric antrum and pyloric outlet were unremarkable. Retroflexion of the endoscope revealed normal fundus and cardia. Biopsies were taken from the gastric antrum to rule out H. pylori Duodenum duodenal bulb and descending duodenum was normal The scope was then withdrawn from the patient and the procedure completed.  COMPLICATIONS: There were no complications. ENDOSCOPIC IMPRESSION:  essentially normal upper endoscopy of esophagus stomach and duodenum. Small reducible hiatal hernia Status post biopsies so from the gastric antrum to rule out H. pylori   RECOMMENDATIONS: 1.   Await pathology results 2.  Anti-reflux regimen to be follow 3.  Continue PPI  REPEAT EXAM: for EGD pending biopsy results.  eSigned:  Lafayette Dragon, MD 04/18/2014 4:37 PM   CC:

## 2014-04-19 ENCOUNTER — Telehealth: Payer: Self-pay | Admitting: *Deleted

## 2014-04-19 NOTE — Telephone Encounter (Signed)
  Follow up Call-  Call back number 04/18/2014 06/07/2013  Post procedure Call Back phone  # [phone]  Permission to leave phone message Yes Yes     Patient questions:  Do you have a fever, pain , or abdominal swelling? no Pain Score  0 *  Have you tolerated food without any problems? yes  Have you been able to return to your normal activities? yes  Do you have any questions about your discharge instructions: Diet   no Medications  no Follow up visit  no  Do you have questions or concerns about your Care? no  Actions: * If pain score is 4 or above: No action needed, pain <4.

## 2014-04-24 ENCOUNTER — Telehealth: Payer: Self-pay | Admitting: Internal Medicine

## 2014-04-24 ENCOUNTER — Encounter: Payer: Self-pay | Admitting: Internal Medicine

## 2014-04-24 NOTE — Telephone Encounter (Signed)
Please tell her that email I sent on stem cells bounced back. Please get me her correct email  Thanks  Dr. Brand Males, M.D., [facility].C.P Pulmonary and Critical Care Medicine Staff Physician [place] [facility] Pager: [phone], If no answer or between  15:00h - 7:00h: call [phone]  04/24/2014 11:05 PM

## 2014-04-25 NOTE — Telephone Encounter (Signed)
Called and spoke to pt in regards to correct email address. Pt stated her email is: [email or web address]

## 2014-04-27 NOTE — Progress Notes (Signed)
Please set her up for a Barium esophagram when she comes back. Tell her I have spoken to Dr Lynford Citizen and that's what we recommend to do.

## 2014-05-01 ENCOUNTER — Other Ambulatory Visit: Payer: Self-pay | Admitting: Family Medicine

## 2014-05-01 ENCOUNTER — Encounter: Payer: Self-pay | Admitting: *Deleted

## 2014-05-01 ENCOUNTER — Telehealth: Payer: Self-pay | Admitting: *Deleted

## 2014-05-01 NOTE — Telephone Encounter (Signed)
Lafayette Dragon, MD Hulan Saas, RN            Please bring her for an apointment first.      Previous Messages    Her hiatal hernia is not large enough to need reduction surgically plus she told me that one Prilosec/day takes care of her reflux symptoms. If you are suspecting significant component of reflux in her lung disease, then the next step will be Barium esophagram to assess motility and quantitated the reflux, She and her husband are on a trip to [place] but I will call her when she comes back. I know her husband from [employer] day [employer], he is a Visual merchandiser there. ----- Message ----- From: April D Mirts, RN Sent: 04/26/2014 2:29 PM To: Lafayette Dragon, MD

## 2014-05-01 NOTE — Telephone Encounter (Signed)
Scheduled patient on 05/29/14 at 2:30 PM. Letter mailed.

## 2014-05-29 ENCOUNTER — Ambulatory Visit: Payer: Medicare Other | Admitting: Internal Medicine

## 2014-06-11 ENCOUNTER — Other Ambulatory Visit: Payer: Self-pay | Admitting: Family Medicine

## 2014-06-19 ENCOUNTER — Ambulatory Visit (INDEPENDENT_AMBULATORY_CARE_PROVIDER_SITE_OTHER): Payer: Medicare Other | Admitting: Internal Medicine

## 2014-06-19 ENCOUNTER — Encounter: Payer: Self-pay | Admitting: Internal Medicine

## 2014-06-19 VITALS — BP 120/60 | HR 66 | Ht 60.0 in | Wt 152.6 lb

## 2014-06-19 DIAGNOSIS — K219 Gastro-esophageal reflux disease without esophagitis: Secondary | ICD-10-CM

## 2014-06-19 NOTE — Progress Notes (Signed)
Rhonda [name] 12-Aug-1951 [id number]  Note: This dictation was prepared with Dragon digital system. Any transcriptional errors that result from this procedure are unintentional.   History of Present Illness:  This is a 63 year old white female with steroid dependent interstitial lung disease. We have seen her for symptoms of gastroesophageal reflux. She had an upper endoscopy on 04/18/2014 which showed mild gastritis. Her H. pylori was negative. She feels much better on Prilosec 20 mg every morning. She made a trip to [place] for 3 weeks and reports feeling much better. She was able to reduce her prednisone to 5 mg every other day. She feels tired but overall better. She discontinued  Fosamax because of esophageal problems. She is up-to-date on  colonoscopy. Last exam in July 2014 was negative.    Past Medical History  Diagnosis Date  . High cholesterol   . History of chronic bronchitis     as child  . GERD (gastroesophageal reflux disease)   . Arthritis   . Depression   . Allergy   . Asthma   . Cataract   . Pneumonitis, hypersensitivity     a. 09/2012 s/p Bx - ? 2/2 bird, mold, oil paint exposure ->on steroids, followed by pulm.  . Rapid heart rate   . DOE (dyspnea on exertion)     a. 04/2010 Lexi MV EF 71%, no ischemia/infarct;    . Hyperplastic colon polyp 2007  . Hiatal hernia   . Interstitial lung disease   . Insulin resistance     past  . Inguinal hernia     right  . Pulmonary fibrosis   . Helicobacter pylori ab+     Past Surgical History  Procedure Laterality Date  . Tubal ligation    . Foot fracture surgery  2006 or 2007    right  . Video bronchoscopy  11/19/2011    Procedure: VIDEO BRONCHOSCOPY WITH FLUORO;  Surgeon: Brand Males, MD;  Location: [facility] ENDOSCOPY;  Service: Endoscopy;;  . Hymenectomy    . Cesarean section    . Video assisted thoracoscopy  09/28/2012    Procedure: VIDEO ASSISTED THORACOSCOPY;  Surgeon: Melrose Nakayama, MD;  Location: [place];  Service: Thoracic;  Laterality: Right;  . Lung biopsy  09/28/2012    Procedure: LUNG BIOPSY;  Surgeon: Melrose Nakayama, MD;  Location: [place];  Service: Thoracic;  Laterality: N/A;  lung biopsies tims three  . Breast biopsy Right 2009    BIOPSY    Allergies  Allergen Reactions  . Atorvastatin Other (See Comments)    Leg pain  . Betadine [Povidone Iodine] Other (See Comments)    blisters  . Codeine Nausea And Vomiting  . Rosuvastatin Other (See Comments)    Leg pain  . Shellfish Allergy Other (See Comments)    vomiting  . Sulfonamide Derivatives Other (See Comments)    headaches    Family history and social history have been reviewed.  Review of Systems: Negative for cough dysphagia  The remainder of the 10 point ROS is negative except as outlined in the H&P  Physical Exam: General Appearance Well developed, in no distress Eyes  Non icteric  HEENT  Non traumatic, normocephalic  Mouth No lesion, tongue papillated, no cheilosis Neck Supple without adenopathy, thyroid not enlarged, no carotid bruits, no JVD Lungs Clear to auscultation bilaterally COR Normal S1, normal S2, regular rhythm, no murmur, quiet precordium Abdomen soft nontender with normoactive bowel sounds. Liver edge at costal margin Rectal not done Extremities  No pedal edema Skin No lesions Neurological Alert and oriented x 3 Psychological Normal mood and affect  Assessment and Plan:   Problem #19 63 year old white female with interstitial lung disease who has chronic gastroesophageal reflux which is currently under good control with 20 mg of Prilosec every morning. I told her to purchase Gaviscon and take 1-2 tablets after meals as needed for indigestion. She will followup with [place] when necessary.    Delfin Edis 06/19/2014

## 2014-06-19 NOTE — Patient Instructions (Addendum)
Please purchase the following medications over the counter and take as directed: Gaviscon-Chew 1-2 tablets after meals  CC:Dr Ellsworth Lennox, Dr Chase Caller

## 2014-06-29 ENCOUNTER — Encounter: Payer: Self-pay | Admitting: Internal Medicine

## 2014-06-29 ENCOUNTER — Ambulatory Visit (INDEPENDENT_AMBULATORY_CARE_PROVIDER_SITE_OTHER): Payer: Medicare Other | Admitting: Internal Medicine

## 2014-06-29 VITALS — BP 126/64 | HR 70 | Ht 60.0 in | Wt 154.0 lb

## 2014-06-29 DIAGNOSIS — R0609 Other forms of dyspnea: Secondary | ICD-10-CM

## 2014-06-29 DIAGNOSIS — J679 Hypersensitivity pneumonitis due to unspecified organic dust: Secondary | ICD-10-CM

## 2014-06-29 DIAGNOSIS — R0989 Other specified symptoms and signs involving the circulatory and respiratory systems: Secondary | ICD-10-CM

## 2014-06-29 DIAGNOSIS — R0600 Dyspnea, unspecified: Secondary | ICD-10-CM

## 2014-06-29 NOTE — Assessment & Plan Note (Signed)
#  ILD /Pulmonary Fibrosis due to Chronic Hypersensitivity Pneumonitis Breathing test and walking  appears stable v early stage of decline (? Measurement error) Given your GI side effects I am ok with reduced prednisone but as discussed I am concerned this could worsen lung function Continue prednisone but at 5mg  on Monday, Wed, Friday  Continue prilosec daily Stay off  fosfamax because this can make acid reflux worse I will d/w Dr Geannie Risen about the stem cell option  #FOllowup - FUll PFT with June Leap in 8 week  - REturn to see me in 8 weeks or sooner if needed    > 50% of this > 25 min visit spent in face to face counseling (15 min visit converted to 25 min)

## 2014-06-29 NOTE — Progress Notes (Signed)
Subjective:    Patient ID: Rhonda [name], female    DOB: 1950-12-28, 63 y.o.   MRN: [id number]  HPI      #GE reflux with small hiatal hernia  - on ppi  #MEtabolic Syndrome  - On low glycemic diet  159#  on 09/19/12 147# on 11/21/12 145# on 01/02/13 145# on 02/28/2013 147# - on 05/25/2013 Filed Weights   03/15/14 1624  Weight: 151 lb (68.493 kg)   Filed Weights   06/29/14 1700  Weight: 154 lb (69.854 kg)      #History of rapid heart rate not otherwise specified  - Start her on Lopressor  2008/2009 by primary care physician. History appears to correlate with onset of respiratory issues  - refuses to dc this drug as of 2014 discussion   # Hypersensitivity Pneumonitis  - Potential etiologies - cockateil x 2 for 18 years through end 2012. 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 Denies mold but lives in house built in 1936 with a "weird baselment: and has humidifier  - first noted on CT chest 10/15/09 following trip to [place] (PE negative) but not described in 2003 CT chest report  - autoimmune panel: 10/23/11: Negative  -  Uderwennt bronch 11/19/11  - non diagnostic  - VATS Nov 2013 (done after initially refusing and strongly counseling her to undergo VATS) - CRONIC HYPERSENSITIVITY PNEUMONITIS WITH GRANULOMA. However, there is worrying trend of UIP pattern in the Upper lobes.        Oct/Nov 2012  March 2013 08/11/12  Nov/Dec 2013 Dx HP/UIP 01/02/2013  02/28/13 05/25/2013  12/06/2013  03/15/2014  06/29/2014   Symp/Signs Dyspnea x5y  New crackles  dimished dyspnea except at hill. Improved crackles.  Improved dyspnea except @ hill      Fev1  1.9 L   1.7L/90%  1.66 L/87%   1.66 L/78%  1.75L/90% 1.68L/82% 1.56/75 1.6L/78% 1.48/75%  FVC  2.6 L    2.2 L/82%   2.1 L/80%   2.19 L/82%  2.2L/91% 2.1/81% 1.86/69% 1.9L/71% 1.86/75%  Ratio     79   76/95%  80/99% 80/100%  84   fef 25-75%            TLC  4.3 L     3.3 L/79%  3.5 L/82%     3.22/72% 3.06L/68%   DLCO  10/50%    10/51%   9.6/51%     11.9/63% 11.7/62%   Walk test 185 ft x 3 laps on RA  rest 92%. Pk exertion 90%. Pk HR 108 Rest 92%. Pk exertion 86% at 2.5 laps. Pk HR 102  pk exertion - 88% Rest 96%. Pk 95%. Pk HR is 75. Done at full dose lopressor Rest 94%. Pk 91%. Rest HR 93 with pk HR 103/min. Done at half dose lopressor REst 96%. Pk pulse ox 92%. Rest HR 65. Pk HR 86 Did not desat. Normal Pk HR  Pulse ox 96%, droped to89% at 2nd lap but bounced up and ended at 91% 3rd lap  CT chest   unchanged         Tests/Bx Bronch 0- non diagnostic   VATS - HP with UIP in Upper lobe        RX    Rx pred start 11/23/12 On  pred 30mg  per day and will reduce to 20mg  On Pred 20mg  per day On pred 10mg  per day On pred 5mg  per  day Rx pred 5mg  daily She reduced to pred 5mg  x past 1 month QOD due to GI concern side effects                 OV 03/15/2014  Chief Complaint  Patient presents with  . Follow-up    Review PFT and 6MW test. Pt states she has noticed some wheezing on and off recently. She feels this may be due to pollen.     Followup interstitial lung disease/pulmonary fibrosis due to chronic hypersensitivity pneumonitis with UIP pattern  Since last visit her dyspnea continues to be stable. There is no worsening of symptoms associate from the respiratory front. Pulmonary function test documented in the table about is stable. 6 GERD walk test is good without any desaturation.  Today's visit focused on several issues -One is the future for her interstitial lung disease. She inquired about stem cell transplant which I do nothing about. I did discuss that there was potential that her interstitial lung disease could worsen because of the UIP pattern on her lung biopsy which has overlap with idiopathic pulmonary fibrosis. At this point increase steroids versus lung transplant would be an option. I also discussed the possibility of changing a diagnosis of  idiopathic pulmonary fibrosis and trying to obtain one of the new drugs in the Pirfenidone or OFEV. We discussed these drugs. At this point in time due to continued stability she wants to maintain herself on Famvir and prednisone.  She continues to have significant GI issues. She recently finished her antibiotic course for H. pylori and this helped her significantly. She does have hiatal hernia and is due to see Dr. Narda Rutherford soon. She did discuss with her the fact acid reflux can make interstitial lung disease worse. In the foot would be best for her to stop Fosamax especially now that she osteoporosis is been ruled out. Also I advocated fundoplication for hiatal hernia if Dr. Maurene Capes thought this was the cause of her GI reflux symptoms. There is anecdotal evidence that Nissen fundoplication can be pulmonary protective for interstitial lung disease  She wants to  have Prevnar vaccine today   Resting pulse ox of 95% with a heart rate of 76. At the end of 66 minute about the pulse ox was 93% without desaturation with a heart rate of 84 per minute.. 2 minutes later pulse ox was 96%. With a heart rate of 76 her minute. She felt wheezy towards the end. Distance covered was 397.5 m  REC #ILD /Pulmonary Fibrosis due to Chronic Hypersensitivity Pneumonitis Breathing test appears stable We discussed transplant as an option but I respect your sentiment not to entertain the possibility right now Continue to ensure no mold exposure at home Continue prednisone at 5mg  per day As discussed acid reflux can make ILD/Pulmonary Fibrosis worse  - Continue prilosec daily  - d/w Dr Delfin Edis about surgical repair for hiatal hernia; if candidate would recommend it  - stop fosfamax because this can make acid reflux worse Have PREVNAR vaccine today Discuss with PCP MCPHERSON,BARBARA, MD about shingles vaccine  #FOllowup - FUll PFT with June Leap in 8 months  - REturn to see me in 8 months or sooner if needed     OV 06/29/2014  Chief Complaint  Patient presents with  . Follow-up    Pt has questions about pred frequency. Pt wasn't scheduled to come back till 10/2014 but has questions about pred. Pt states her breathing is unchanged. Pt states she only  gets SOB when ambulating up hills and mild dry cough. Pt denies CP.     Followup chronic hypersensitivity pneumonitis with UIP pattern of pulmonary fibrosis  - Last in April 2015. Since then she's reporting stable overall health especially from a pulmonary perspective. The main issue is ongoing GI issues. She underwent the extensive GI workup and other than small hiatal hernia and acid reflux it looks like there was nothing else. Acid reflux this controlled by Prilosec daily. However she continues to have GI symptoms of bloating, abdominal pain and diarrhea. There is sometimes severe but she is unable to even control or so to get to the bathroom in time. Over the summer she was in [place] for a few weeks and at this time ate bread and her GI symptoms resolve. Upon return to the [place] she has been trying to eat healthy but solid and her GI symptoms have gotten worse. She strongly feels that prednisone is responsible for her GI symptoms and she wants to get off prednisone completely. Therefore she made this visit  Walking desaturation as hard and 185 feet and 3 laps on room air: Lowest pulse ox was 89% in the middle of the test but she finally finished at 91%  Spirometry shows possible mild decline in forced vital capacity and FEV1 but it could well be within the measurement of error  Review of Systems  Constitutional: Negative for fever and unexpected weight change.  HENT: Negative for congestion, dental problem, ear pain, nosebleeds, postnasal drip, rhinorrhea, sinus pressure, sneezing, sore throat and trouble swallowing.   Eyes: Negative for redness and itching.  Respiratory: Positive for cough. Negative for chest tightness, shortness of breath  and wheezing.   Cardiovascular: Negative for palpitations and leg swelling.  Gastrointestinal: Negative for nausea and vomiting.  Genitourinary: Negative for dysuria.  Musculoskeletal: Negative for joint swelling.  Skin: Negative for rash.  Neurological: Negative for headaches.  Hematological: Does not bruise/bleed easily.  Psychiatric/Behavioral: Negative for dysphoric mood. The patient is not nervous/anxious.    Current outpatient prescriptions:Alum Hydroxide-Mag Carbonate (GAVISCON PO), Take by mouth as needed., Disp: , Rfl: ;  acetaminophen (TYLENOL) 500 MG tablet, Take 500 mg by mouth as needed. , Disp: , Rfl: ;  calcium carbonate (TUMS - DOSED IN MG ELEMENTAL CALCIUM) 500 MG chewable tablet, Chew 2 tablets by mouth daily., Disp: , Rfl: ;  lactase (LACTAID) 3000 UNITS tablet, Take by mouth as needed., Disp: , Rfl:  levocetirizine (XYZAL) 5 MG tablet, TAKE 1 TABLET BY MOUTH AS NEEDED. TAKES DURING ALLERGY SEASON, Disp: 30 tablet, Rfl: 2;  metoprolol succinate (TOPROL-XL) 50 MG 24 hr tablet, TAKE 1 TABLET EVERY DAY IMMEDIATELY FOLLOWING A MEAL, Disp: 90 tablet, Rfl: 3;  omeprazole (PRILOSEC) 20 MG capsule, Take 1 capsule (20 mg total) by mouth daily., Disp: 30 capsule, Rfl: 5;  pravastatin (PRAVACHOL) 40 MG tablet, Take 20 mg by mouth daily. , Disp: , Rfl:  predniSONE (DELTASONE) 5 MG tablet, Take 5 mg by mouth every other day. , Disp: , Rfl: ;  Probiotic Product (PROBIOTIC DAILY PO), Take by mouth. Take 2 gummies daily, Disp: , Rfl:      Objective:   Physical Exam  Vitals reviewed. Constitutional: She is oriented to person, place, and time. She appears well-developed and well-nourished. No distress.  HENT:  Head: Normocephalic and atraumatic.  Right Ear: External ear normal.  Left Ear: External ear normal.  Mouth/Throat: Oropharynx is clear and moist. No oropharyngeal exudate.  Eyes: Conjunctivae and EOM  are normal. Pupils are equal, round, and reactive to light. Right eye exhibits no  discharge. Left eye exhibits no discharge. No scleral icterus.  Neck: Normal range of motion. Neck supple. No JVD present. No tracheal deviation present. No thyromegaly present.  Cardiovascular: Normal rate, regular rhythm, normal heart sounds and intact distal pulses.  Exam reveals no gallop and no friction rub.   No murmur heard. Pulmonary/Chest: Effort normal. No respiratory distress. She has no wheezes. She has rales. She exhibits no tenderness.  Abdominal: Soft. Bowel sounds are normal. She exhibits no distension and no mass. There is no tenderness. There is no rebound and no guarding.  Musculoskeletal: Normal range of motion. She exhibits no edema and no tenderness.  Lymphadenopathy:    She has no cervical adenopathy.  Neurological: She is alert and oriented to person, place, and time. She has normal reflexes. No cranial nerve deficit. She exhibits normal muscle tone. Coordination normal.  Skin: Skin is warm and dry. No rash noted. She is not diaphoretic. No erythema. No pallor.  Psychiatric: She has a normal mood and affect. Her behavior is normal. Judgment and thought content normal.     Filed Vitals:   06/29/14 1700  BP: 126/64  Pulse: 70  Height: 5' (1.524 m)  Weight: 154 lb (69.854 kg)  SpO2: 96%        Assessment & Plan:  #ILD /Pulmonary Fibrosis due to Chronic Hypersensitivity Pneumonitis Breathing test and walking  appears stable v early stage of decline (? Measurement error) Given your GI side effects I am ok with reduced prednisone but as discussed I am concerned this could worsen lung function Continue prednisone but at 5mg  on Monday, Wed, Friday  Continue prilosec daily Stay off  fosfamax because this can make acid reflux worse I will d/w Dr Geannie Risen about the stem cell option  #FOllowup - FUll PFT with June Leap in 8 week  - REturn to see me in 8 weeks or sooner if needed

## 2014-06-29 NOTE — Patient Instructions (Addendum)
#  ILD /Pulmonary Fibrosis due to Chronic Hypersensitivity Pneumonitis Breathing test and walking  appears stable v early stage of decline (? Measurement error) Given your GI side effects I am ok with reduced prednisone but as discussed I am concerned this could worsen lung function Continue prednisone but at 5mg  on Monday, Wed, Friday  Continue prilosec daily Stay off  fosfamax because this can make acid reflux worse I will d/w Dr Geannie Risen about the stem cell option  #FOllowup - FUll PFT with June Leap in 8 week  - REturn to see me in 8 weeks or sooner if needed

## 2014-07-02 ENCOUNTER — Telehealth: Payer: Self-pay

## 2014-07-02 ENCOUNTER — Other Ambulatory Visit: Payer: Self-pay

## 2014-07-02 DIAGNOSIS — K219 Gastro-esophageal reflux disease without esophagitis: Secondary | ICD-10-CM

## 2014-07-02 DIAGNOSIS — IMO0001 Reserved for inherently not codable concepts without codable children: Secondary | ICD-10-CM

## 2014-07-02 NOTE — Telephone Encounter (Signed)
If pt not having any GERD symptoms, hoarsness or chest pain, we will not proceed with pre- Nissen evaluation.

## 2014-07-02 NOTE — Telephone Encounter (Signed)
Message copied by Algernon Huxley on Mon Jul 02, 2014  9:37 AM ------      Message from: Lafayette Dragon      Created: Sun Jul 01, 2014  4:21 PM       Belva Crome, thanks for  Your Progress note. Interestingly, on her visit with me 2 weeks ago, she reported improvement  In GERD symptoms,controlled on PPI once a day only. So I did not push for Nissen. But  Your note indicates significant problems with reflux. I will obtain Barium esophagram to assess for free reflux  and to r/o significant dismotility which would preclude Nissen.  We will call her  If it is OK with you.       Regina, please set up pt for Barium esophagram., tell her  I have communicated with Dr Lynford Citizen and we need to assess her reflux. thanx DB      ----- Message -----         From: Brand Males, MD         Sent: 06/29/2014   5:54 PM           To: Lafayette Dragon, MD                   ------

## 2014-07-02 NOTE — Telephone Encounter (Signed)
Spoke with pt and she states she is not having any issues with reflux. Pt states the only issue she has is occasional diarrhea and she states Dr. Olevia Perches is aware of that and thought it could possibly be related to prednisone. Pt states she is fine now, no issues. Barium swallow cancelled.

## 2014-07-02 NOTE — Telephone Encounter (Signed)
Pt scheduled for barium swallow at [facility] 07/09/14. Pt to arrive there at 10:15am for 10:30am appt. Pt to be NPO for 3 hours prior to exam. Left message for pt to call back.

## 2014-07-09 ENCOUNTER — Ambulatory Visit (HOSPITAL_COMMUNITY): Payer: Medicare Other

## 2014-07-10 ENCOUNTER — Other Ambulatory Visit: Payer: Self-pay

## 2014-07-10 MED ORDER — PRAVASTATIN SODIUM 40 MG PO TABS: 20.0000 mg | ORAL_TABLET | Freq: Every day | ORAL | Status: DC

## 2014-08-20 ENCOUNTER — Telehealth: Payer: Self-pay | Admitting: Internal Medicine

## 2014-08-20 NOTE — Telephone Encounter (Signed)
Rhonda [name], please see immunizations and completed PNA vaccine entry. MR is unable to close encounter as vaccination history is not updated/completed.  Per AVS, Prevnar vaccine was given 03/15/14 at [facility] Patient Instructions     #ILD /Pulmonary Fibrosis due to Chronic Hypersensitivity Pneumonitis  Breathing test appears stable  We discussed transplant as an option but I respect your sentiment not to entertain the possibility right now  Continue to ensure no mold exposure at home  Continue prednisone at 5mg  per day  As discussed acid reflux can make ILD/Pulmonary Fibrosis worse  - Continue prilosec daily  - d/w Dr Delfin Edis about surgical repair for hiatal hernia; if candidate would recommend it  - stop fosfamax because this can make acid reflux worse  Have PREVNAR vaccine today  Discuss with PCP MCPHERSON,BARBARA, MD about shingles vaccine  #FOllowup  - FUll PFT with June Leap in 8 months  - REturn to see me in 8 months or sooner if needed    Please document per MR request thanks.

## 2014-08-20 NOTE — Telephone Encounter (Addendum)
Yes please let patient know that I was finally able to contact Dr Nonnie Done last weekend in [place] after playing phone tag. The stem cell trial is not available for hypersensitivity pneumonitis but Dr Nonnie Done is willing to see patient for second opinion and advise.  I wil email Aretha Parrot as well  Also, triage there is a immunixation hx from April 2015 visit that [facility] has not reconciled an dI am unable to close that note out. Please do that so I can close that note out. End of the year is in 2 days  Thanks  Dr. Brand Males, M.D., [facility].C.P Pulmonary and Critical Care Medicine Staff Physician [place] [facility] Pager: [phone], If no answer or between  15:00h - 7:00h: call [phone]  08/20/2014 3:14 PM

## 2014-08-20 NOTE — Telephone Encounter (Signed)
Immunizations has been completed.

## 2014-08-20 NOTE — Telephone Encounter (Signed)
Pt aware of rec's per MR Has received e-mail and is going to contact her insurance company regarding coverage for out of state costs.  Will contact our office back if anything further needed.

## 2014-08-20 NOTE — Telephone Encounter (Signed)
Spoke with pt-- wanting to know if Dr Chase Caller has spoken with [facility] yet as discussed at last [facility].  Pt states that MR was supposed to reach out to a Dr Nonnie Done at [facility] regarding a research study? Pt states that if she is not available via telephone, Dr Chase Caller can e-mail her with information at [email or web address] Please advise Dr Chase Caller. Thanks.

## 2014-08-21 NOTE — Telephone Encounter (Signed)
Nothing further needed. Will sign off.  

## 2014-08-24 ENCOUNTER — Ambulatory Visit: Payer: Medicare Other | Admitting: Adult Health

## 2014-08-24 ENCOUNTER — Ambulatory Visit (INDEPENDENT_AMBULATORY_CARE_PROVIDER_SITE_OTHER): Payer: Medicare Other | Admitting: Internal Medicine

## 2014-08-24 ENCOUNTER — Other Ambulatory Visit: Payer: Self-pay | Admitting: Internal Medicine

## 2014-08-24 ENCOUNTER — Encounter: Payer: Self-pay | Admitting: Internal Medicine

## 2014-08-24 VITALS — BP 130/70 | HR 80 | Temp 98.6°F | Ht 60.0 in | Wt 156.0 lb

## 2014-08-24 DIAGNOSIS — J679 Hypersensitivity pneumonitis due to unspecified organic dust: Secondary | ICD-10-CM

## 2014-08-24 DIAGNOSIS — J069 Acute upper respiratory infection, unspecified: Secondary | ICD-10-CM

## 2014-08-24 MED ORDER — AZITHROMYCIN 250 MG PO TABS: ORAL_TABLET | ORAL | Status: DC

## 2014-08-24 NOTE — Progress Notes (Signed)
Subjective:    Patient ID: Rhonda [name], female    DOB: Nov 18, 1951   MRN: [id number]  HPI      #GE reflux with small hiatal hernia  - on ppi  #MEtabolic Syndrome  - On low glycemic diet  159#  on 09/19/12 147# on 11/21/12 145# on 01/02/13 145# on 02/28/2013 147# - on 05/25/2013 Filed Weights   03/15/14 1624  Weight: 151 lb (68.493 kg)   Filed Weights   06/29/14 1700  Weight: 154 lb (69.854 kg)      #History of rapid heart rate not otherwise specified  - Start her on Lopressor  2008/2009 by primary care physician. History appears to correlate with onset of respiratory issues  - refuses to dc this drug as of 2014 discussion   # Hypersensitivity Pneumonitis  - Potential etiologies - cockateil x 2 for 18 years through end 2012. 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 Denies mold but lives in house built in 1936 with a "weird baselment: and has humidifier  - first noted on CT chest 10/15/09 following trip to [place] (PE negative) but not described in 2003 CT chest report  - autoimmune panel: 10/23/11: Negative  -  Uderwennt bronch 11/19/11  - non diagnostic  - VATS Nov 2013 (done after initially refusing and strongly counseling her to undergo VATS) - CRONIC HYPERSENSITIVITY PNEUMONITIS WITH GRANULOMA. However, there is worrying trend of UIP pattern in the Upper lobes.        Oct/Nov 2012  March 2013 08/11/12  Nov/Dec 2013 Dx HP/UIP 01/02/2013  02/28/13 05/25/2013  12/06/2013  03/15/2014  06/29/2014   Symp/Signs Dyspnea x5y  New crackles  dimished dyspnea except at hill. Improved crackles.  Improved dyspnea except @ hill      Fev1  1.9 L   1.7L/90%  1.66 L/87%   1.66 L/78%  1.75L/90% 1.68L/82% 1.56/75 1.6L/78% 1.48/75%  FVC  2.6 L    2.2 L/82%   2.1 L/80%   2.19 L/82%  2.2L/91% 2.1/81% 1.86/69% 1.9L/71% 1.86/75%  Ratio     79   76/95%  80/99% 80/100%  84   fef 25-75%            TLC  4.3 L    3.3  L/79%  3.5 L/82%     3.22/72% 3.06L/68%   DLCO  10/50%    10/51%   9.6/51%     11.9/63% 11.7/62%   Walk test 185 ft x 3 laps on RA  rest 92%. Pk exertion 90%. Pk HR 108 Rest 92%. Pk exertion 86% at 2.5 laps. Pk HR 102  pk exertion - 88% Rest 96%. Pk 95%. Pk HR is 75. Done at full dose lopressor Rest 94%. Pk 91%. Rest HR 93 with pk HR 103/min. Done at half dose lopressor REst 96%. Pk pulse ox 92%. Rest HR 65. Pk HR 86 Did not desat. Normal Pk HR  Pulse ox 96%, droped to89% at 2nd lap but bounced up and ended at 91% 3rd lap  CT chest   unchanged         Tests/Bx Bronch 0- non diagnostic   VATS - HP with UIP in Upper lobe        RX    Rx pred start 11/23/12 On  pred 30mg  per day and will reduce to 20mg  On Pred 20mg  per day On pred 10mg  per day On pred 5mg  per day Rx  pred 5mg  daily She reduced to pred 5mg  x past 1 month QOD due to GI concern side effects                 OV 03/15/2014  Chief Complaint  Patient presents with  . Follow-up    Review PFT and 6MW test. Pt states she has noticed some wheezing on and off recently. She feels this may be due to pollen.     Followup interstitial lung disease/pulmonary fibrosis due to chronic hypersensitivity pneumonitis with UIP pattern  Since last visit her dyspnea continues to be stable. There is no worsening of symptoms associate from the respiratory front. Pulmonary function test documented in the table about is stable. 6 GERD walk test is good without any desaturation.  Today's visit focused on several issues -One is the future for her interstitial lung disease. She inquired about stem cell transplant which I do nothing about. I did discuss that there was potential that her interstitial lung disease could worsen because of the UIP pattern on her lung biopsy which has overlap with idiopathic pulmonary fibrosis. At this point increase steroids versus lung transplant would be an option. I also discussed the possibility of changing a diagnosis of idiopathic  pulmonary fibrosis and trying to obtain one of the new drugs in the Pirfenidone or OFEV. We discussed these drugs. At this point in time due to continued stability she wants to maintain herself on Famvir and prednisone.  She continues to have significant GI issues. She recently finished her antibiotic course for H. pylori and this helped her significantly. She does have hiatal hernia and is due to see Dr. Narda Rutherford soon. She did discuss with her the fact acid reflux can make interstitial lung disease worse. In the foot would be best for her to stop Fosamax especially now that she osteoporosis is been ruled out. Also I advocated fundoplication for hiatal hernia if Dr. Maurene Capes thought this was the cause of her GI reflux symptoms. There is anecdotal evidence that Nissen fundoplication can be pulmonary protective for interstitial lung disease  She wants to  have Prevnar vaccine today   Resting pulse ox of 95% with a heart rate of 76. At the end of 66 minute about the pulse ox was 93% without desaturation with a heart rate of 84 per minute.. 2 minutes later pulse ox was 96%. With a heart rate of 76 her minute. She felt wheezy towards the end. Distance covered was 397.5 m  REC #ILD /Pulmonary Fibrosis due to Chronic Hypersensitivity Pneumonitis Breathing test appears stable We discussed transplant as an option but I respect your sentiment not to entertain the possibility right now Continue to ensure no mold exposure at home Continue prednisone at 5mg  per day As discussed acid reflux can make ILD/Pulmonary Fibrosis worse  - Continue prilosec daily  - d/w Dr Delfin Edis about surgical repair for hiatal hernia; if candidate would recommend it  - stop fosfamax because this can make acid reflux worse Have PREVNAR vaccine today Discuss with PCP MCPHERSON,BARBARA, MD about shingles vaccine  #FOllowup - FUll PFT with June Leap in 8 months  - REturn to see me in 8 months or sooner if needed    OV  06/29/2014  Chief Complaint  Patient presents with  . Follow-up    Pt has questions about pred frequency. Pt wasn't scheduled to come back till 10/2014 but has questions about pred. Pt states her breathing is unchanged. Pt states she only gets SOB  when ambulating up hills and mild dry cough. Pt denies CP.     Followup chronic hypersensitivity pneumonitis with UIP pattern of pulmonary fibrosis  - Last in April 2015. Since then she's reporting stable overall health especially from a pulmonary perspective. The main issue is ongoing GI issues. She underwent the extensive GI workup and other than small hiatal hernia and acid reflux it looks like there was nothing else. Acid reflux this controlled by Prilosec daily. However she continues to have GI symptoms of bloating, abdominal pain and diarrhea. There is sometimes severe but she is unable to even control or so to get to the bathroom in time. Over the summer she was in [place] for a few weeks and at this time ate bread and her GI symptoms resolve. Upon return to the [place] she has been trying to eat healthy but solid and her GI symptoms have gotten worse. She strongly feels that prednisone is responsible for her GI symptoms and she wants to get off prednisone completely. Therefore she made this visit  Walking desaturation as hard and 185 feet and 3 laps on room air: Lowest pulse ox was 89% in the middle of the test but she finally finished at 91%  Spirometry shows possible mild decline in forced vital capacity and FEV1 but it could well be within the measurement of error rec Given your GI side effects I am ok with reduced prednisone but as discussed I am concerned this could worsen lung function Continue prednisone but at 5mg  on Monday, Wed, Friday  Continue prilosec daily Stay off  fosfamax because this can make acid reflux worsen    08/24/2014  Acute  ov/Unita Detamore re: uri  Chief Complaint  Patient presents with  . Acute Visit    Pt c/o  increased SOB for the past 5 days.  She also c/o aches, ear pain, sore throat and prod cough with clear sputum.    exposed to 20 y old sick child then 5 days prior to OV  Acute onset cough and sob but no cp rigors, purulent sputum  No obvious day to day or daytime variabilty or assoc  cp or chest tightness, subjective wheeze overt sinus or hb symptoms. No unusual exp hx or h/o childhood pna/ asthma or knowledge of premature birth.  Sleeping ok without nocturnal  or early am exacerbation  of respiratory  c/o's or need for noct saba. Also denies any obvious fluctuation of symptoms with weather or environmental changes or other aggravating or alleviating factors except as outlined above   Current Medications, Allergies, Complete Past Medical History, Past Surgical History, Family History, and Social History were reviewed in [employer] record.  ROS  The following are not active complaints unless bolded sore throat, dysphagia, dental problems, itching, sneezing,  nasal congestion or excess/ purulent secretions, ear ache,   fever, chills, sweats, unintended wt loss, pleuritic or exertional cp, hemoptysis,  orthopnea pnd or leg swelling, presyncope, palpitations, heartburn, abdominal pain, anorexia, nausea, vomiting, diarrhea  or change in bowel or urinary habits, change in stools or urine, dysuria,hematuria,  rash, arthralgias, visual complaints, headache, numbness weakness or ataxia or problems with walking or coordination,  change in mood/affect or memory.            Objective:   Physical Exam    amb wf nad  Wt Readings from Last 3 Encounters:  08/24/14 70.761 kg (156 lb)  06/29/14 69.854 kg (154 lb)  06/19/14 69.219 kg (152 lb 9.6  oz)      HEENT: nl dentition, turbinates, and orophanx. Nl external ear canals without cough reflex   NECK :  without JVD/Nodes/TM/ nl carotid upstrokes bilaterally   LUNGS: no acc muscle use,  Min insp crackles bilaterally    CV:   RRR  no s3 or murmur or increase in P2, no edema   ABD:  soft and nontender with nl excursion in the supine position. No bruits or organomegaly, bowel sounds nl  MS:  warm without deformities, calf tenderness, cyanosis or clubbing  SKIN: warm and dry without lesions    NEURO:  alert, approp, no deficits            Assessment & Plan:

## 2014-08-24 NOTE — Patient Instructions (Addendum)
Zpak called in   Prednisone 10 mg take 2 each am x 2 days,   1 each am x 2 days, one half  each am x 2 days and  Then one half every other day maintenance   Continue prilosec 20 mg Take 30-60 min before first meal of the day and add pepcid ac 20 mg after supper or bedtime  For cough > delsym 2 tsp every 12 hours as needed   For drainage > chlortrimeton (chlorpheniramine) 4 mg every 4 hours available over the counter (may cause drowsiness)   GERD (REFLUX)  is an extremely common cause of respiratory symptoms, many times with no significant heartburn at all.    It can be treated with medication, but also with lifestyle changes including avoidance of late meals, excessive alcohol, smoking cessation, and avoid fatty foods, chocolate, peppermint, colas, red wine, and acidic juices such as orange juice.  NO MINT OR MENTHOL PRODUCTS SO NO COUGH DROPS  USE SUGARLESS CANDY INSTEAD (jolley ranchers or Stover's or life savers or ice cubes) NO OIL BASED VITAMINS - use powdered substitutes.

## 2014-08-25 DIAGNOSIS — J069 Acute upper respiratory infection, unspecified: Secondary | ICD-10-CM | POA: Insufficient documentation

## 2014-08-25 NOTE — Assessment & Plan Note (Signed)
No evidence of flare of pneumonitis but likely all URI s significant bacterial involvement yet so should do fine with just a zpak and short burst of "stress" pred no higher than 20 mg and right back to 5 mg qod maint dose  Explained natural history of uri and why it's necessary in patients at risk to treat GERD aggressively  at least  short term   to reduce risk of evolving cyclical cough initially  triggered by epithelial injury and a heightened sensitivty to the effects of any upper airway irritants,  most importantly acid - related.  That is, the more sensitive the epithelium damaged for virus, the more the cough, the more the secondary reflux (especially in those prone to reflux) the more the irritation of the sensitive mucosa and so on in a cyclical pattern.   See instructions for specific recommendations which were reviewed directly with the patient who was given a copy with highlighter outlining the key components.

## 2014-08-25 NOTE — Assessment & Plan Note (Signed)
The goal with a chronic steroid dependent illness is always arriving at the lowest effective dose that controls the disease/symptoms and not accepting a set "formula" which is based on statistics or guidelines that don't always take into account patient  variability or the natural hx of the dz in every individual patient, which may well vary over time.  For now therefore I recommend the patient maintain  A floor of 5 mg qod after a very short burst to cover upper airway inflammation related to uri (see uri)

## 2014-08-28 ENCOUNTER — Other Ambulatory Visit: Payer: Self-pay

## 2014-08-28 DIAGNOSIS — Z1231 Encounter for screening mammogram for malignant neoplasm of breast: Secondary | ICD-10-CM

## 2014-08-29 NOTE — Telephone Encounter (Signed)
Pt calling to check on status of refill for prednisone to [facility] on [address] please advise pt [phone].Rhonda [name]

## 2014-09-03 ENCOUNTER — Ambulatory Visit: Payer: Medicare Other | Admitting: Internal Medicine

## 2014-09-13 ENCOUNTER — Ambulatory Visit
Admission: RE | Admit: 2014-09-13 | Discharge: 2014-09-13 | Disposition: A | Discharge status: Home / Self Care | Payer: Medicare Other | Source: Ambulatory Visit

## 2014-09-13 DIAGNOSIS — Z1231 Encounter for screening mammogram for malignant neoplasm of breast: Secondary | ICD-10-CM

## 2014-09-18 ENCOUNTER — Ambulatory Visit (INDEPENDENT_AMBULATORY_CARE_PROVIDER_SITE_OTHER): Payer: Medicare Other | Admitting: Internal Medicine

## 2014-09-18 ENCOUNTER — Encounter: Payer: Self-pay | Admitting: Internal Medicine

## 2014-09-18 VITALS — BP 130/78 | HR 63 | Ht 60.0 in | Wt 154.0 lb

## 2014-09-18 DIAGNOSIS — J849 Interstitial pulmonary disease, unspecified: Secondary | ICD-10-CM

## 2014-09-18 DIAGNOSIS — J679 Hypersensitivity pneumonitis due to unspecified organic dust: Secondary | ICD-10-CM

## 2014-09-18 DIAGNOSIS — R0600 Dyspnea, unspecified: Secondary | ICD-10-CM

## 2014-09-18 LAB — PULMONARY FUNCTION TEST
DL/VA % pred: 89 %
DL/VA: 3.79 ml/min/mmHg/L
DLCO unc % pred: 61 %
DLCO unc: 11.53 ml/min/mmHg
FEF 25-75 PRE: 2.03 L/s
FEF 25-75 Post: 2.39 L/sec
FEF2575-%CHANGE-POST: 17 %
FEF2575-%PRED-POST: 127 %
FEF2575-%Pred-Pre: 108 %
FEV1-%CHANGE-POST: 6 %
FEV1-%Pred-Post: 77 %
FEV1-%Pred-Pre: 72 %
FEV1-POST: 1.58 L
FEV1-PRE: 1.48 L
FEV1FVC-%CHANGE-POST: 5 %
FEV1FVC-%PRED-PRE: 111 %
FEV6-%Change-Post: 1 %
FEV6-%Pred-Post: 68 %
FEV6-%Pred-Pre: 67 %
FEV6-POST: 1.75 L
FEV6-Pre: 1.72 L
FEV6FVC-%PRED-PRE: 104 %
FEV6FVC-%Pred-Post: 104 %
FVC-%CHANGE-POST: 1 %
FVC-%PRED-PRE: 64 %
FVC-%Pred-Post: 65 %
FVC-PRE: 1.73 L
FVC-Post: 1.75 L
PRE FEV1/FVC RATIO: 86 %
PRE FEV6/FVC RATIO: 100 %
Post FEV1/FVC ratio: 90 %
Post FEV6/FVC ratio: 100 %
RV % PRED: 70 %
RV: 1.33 L
TLC % PRED: 68 %
TLC: 3.06 L

## 2014-09-18 MED ORDER — LEVOFLOXACIN 500 MG PO TABS: 500.0000 mg | ORAL_TABLET | Freq: Every day | ORAL | Status: DC

## 2014-09-18 NOTE — Progress Notes (Signed)
Subjective:    Patient ID: Rhonda [name], female    DOB: 04-Dec-1950, 63 y.o.   MRN: [id number]  HPI   #GE reflux with small hiatal hernia  - on ppi  #MEtabolic Syndrome  - On low glycemic diet  159#  on 09/19/12 147# on 11/21/12 145# on 01/02/13 145# on 02/28/2013 147# - on 05/25/2013 154# - 09/18/14   #History of rapid heart rate not otherwise specified  - Start her on Lopressor  2008/2009 by primary care physician. History appears to correlate with onset of respiratory issues  - refuses to dc this drug as of 2014 discussion   # Hypersensitivity Pneumonitis and ILD  - Potential etiologies - cockateil x 2 for 18 years through end 2012. 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 Denies mold but lives in house built in [place] with a "weird baselment: and has humidifier  - first noted on CT chest 10/15/09 following trip to [place] (PE negative) but not described in 2003 CT chest report  - autoimmune panel: 10/23/11: Negative  -  Uderwennt bronch 11/19/11  - non diagnostic  - VATS Nov 2013 (done after initially refusing)- [place]. However, there is worrying trend of UIP pattern in the Upper lobes.            Oct/Nov 2012  March 2013 08/11/12  Nov/Dec 2013 Dx HP/UIP 01/02/2013  02/28/13 05/25/2013  12/06/2013  03/15/2014  06/29/2014  10'27/15  Symp/Signs Dyspnea x5y  New crackles  dimished dyspnea except at hill. Improved crackles.  Improved dyspnea except @ hill       Fev1  1.9 L   1.7L/90%  1.66 L/87%   1.66 L/78%  1.75L/90% 1.68L/82% 1.56/75 1.6L/78% 1.48/75% 1.58L/77%  FVC  2.6 L    2.2 L/82%   2.1 L/80%   2.19 L/82%  2.2L/91% 2.1/81% 1.86/69% 1.9L/71% 1.86/75% 1.75L/65%  Ratio     79   76/95%  80/99% 80/100%  84  90  fef 25-75%             TLC  4.3 L    3.3 L/79%  3.5 L/82%     3.22/72% 3.06L/68%  3.06/68%  DLCO  10/50%    10/51%   9.6/51%     11.9/63% 11.7/62%   11/561%  Walk test 185 ft x 3 laps on RA  rest 92%. Pk exertion 90%. Pk HR 108 Rest 92%. Pk exertion 86% at 2.5 laps. Pk HR 102  pk exertion - 88% Rest 96%. Pk 95%. Pk HR is 75. Done at full dose lopressor Rest 94%. Pk 91%. Rest HR 93 with pk HR 103/min. Done at half dose lopressor REst 96%. Pk pulse ox 92%. Rest HR 65. Pk HR 86 Did not desat. Normal Pk HR  Pulse ox 96%, droped to89% at 2nd lap but bounced up and ended at 91% 3rd lap Did not desat  CT chest   unchanged          Tests/Bx Bronch 0- non diagnostic   VATS - HP with UIP in Upper lobe         RX    Rx pred start 11/23/12 On  pred 30mg  per day and will reduce to 20mg  On Pred 20mg  per day On pred 10mg  per day On pred 5mg  per day Rx pred 5mg  daily She reduced to pred 5mg  x past 1 month QOD due to GI  concern side effects On pred 5mg  qod but will increase to 5/d after this visit                  OV 09/20/2014  Chief Complaint  Patient presents with  . Follow-up    Pt here after pft. Pt saw MW for an acute visit.  Pt states she feels as if she still has a cold. Pt c/o DOE, prod cough with yellow and white mucus and chest pain in midsternal area.    Follow-up interstitial lung disease due to hypersensitivity pneumonitis  - In terms of interstitial lung disease and hypersensitivity pneumonitis management: At last visit in August 2015 she expressed a desire for non-steroidal treatments [she has been relatively stable except for a slight decline over the years in forced vital capacity]. She was particularly interested in stem cell transplant and I didn't discuss this his for her. With Dr. Nonnie Done in [place]:  does a stem cell transplant trial but it is only for IPF patients. I've cautioned her against going to private stem cell transplant centers. We did discuss a second opinion at [facility] over the phone or going to [facility] to see Dr. Nonnie Done and at this visit she was supposed to bring her husband to go over these in detail part apparently  he has been busy and did not show up for the visit today. Of note she is now continuing since the last visit in August 2015 on prednisone 5 mg every other day  - Currently she feels overall stable except earlier this month she picked up a respiratory infection of being exposed to sick children. She was given a short course prednisone and Z-Pak but she feels that she has not been able to clear her cough after that even though she is somewhat better. Today's pulmonary function tests is essentially stable especially lung volume and diffusion capacity but a forced vital capacity is entered the 1.7 L range for the first time. She feels the current recent illness is contributing to this. She now tells me that the reason she wanted a steroid sparing therapy was because of GI side effects but now GI side effects resovled after being on "low sulfur diet" and therefore she feels that she can go back on prednisone 5 mg once a day. However, she wants to try another course of antibiotics  - Also at this point she wants to be conservative with her therapy and wants to hold off getting a second opinion at [facility] or [facility]  Review of Systems  Constitutional: Negative for fever and unexpected weight change.  HENT: Positive for congestion. Negative for dental problem, ear pain, nosebleeds, postnasal drip, rhinorrhea, sinus pressure, sneezing, sore throat and trouble swallowing.   Eyes: Negative for redness and itching.  Respiratory: Positive for cough and shortness of breath. Negative for chest tightness and wheezing.   Cardiovascular: Positive for chest pain. Negative for palpitations and leg swelling.  Gastrointestinal: Negative for nausea and vomiting.  Genitourinary: Negative for dysuria.  Musculoskeletal: Negative for joint swelling.  Skin: Negative for rash.  Neurological: Negative for headaches.  Hematological: Does not bruise/bleed easily.  Psychiatric/Behavioral: Negative for dysphoric mood. The  patient is not nervous/anxious.        Objective:   Physical Exam   Filed Vitals:   09/18/14 1230  BP: 130/78  Pulse: 63  Height: 5' (1.524 m)  Weight: 154 lb (69.854 kg)  SpO2: 96%   itals reviewed. Constitutional: She is oriented to  person, place, and time. She appears well-developed and well-nourished. No distress.  HENT:  Head: Normocephalic and atraumatic.  Right Ear: External ear normal.  Left Ear: External ear normal.  Mouth/Throat: Oropharynx is clear and moist. No oropharyngeal exudate.  Eyes: Conjunctivae and EOM are normal. Pupils are equal, round, and reactive to light. Right eye exhibits no discharge. Left eye exhibits no discharge. No scleral icterus.  Neck: Normal range of motion. Neck supple. No JVD present. No tracheal deviation present. No thyromegaly present.  Cardiovascular: Normal rate, regular rhythm, normal heart sounds and intact distal pulses.  Exam reveals no gallop and no friction rub.   No murmur heard. Pulmonary/Chest: Effort normal. No respiratory distress. She has no wheezes. She has rales. She exhibits no tenderness.  Abdominal: Soft. Bowel sounds are normal. She exhibits no distension and no mass. There is no tenderness. There is no rebound and no guarding.  Musculoskeletal: Normal range of motion. She exhibits no edema and no tenderness.  Lymphadenopathy:    She has no cervical adenopathy.  Neurological: She is alert and oriented to person, place, and time. She has normal reflexes. No cranial nerve deficit. She exhibits normal muscle tone. Coordination normal.  Skin: Skin is warm and dry. No rash noted. She is not diaphoretic. No erythema. No pallor.  Psychiatric: She has a normal mood and affect. Her behavior is normal. Judgment and thought content normal.       Assessment & Plan:  Interstitial lung disease due to hypersensitivity pneumonitis  - She has a mild reduction in forced vital capacity and a 1.7 L range and if he plotted graph over  the last few years as a definite downward decline in forced vital capacity over the last years although diffusion capacity and lung volumes remain stable. So she might have slow progressive disease. However, she believes that today's drop in [facility] is due to recent respiratory exacerbation and she wants to try another course of antibiotics and go back to 5 mg of prednisone a day. I agree with this plan and we will reassess spirometry in 3 months  - Of note, we did discuss steroids depending therapies of CellCept which is already on a low-dose of prednisone. We did discuss changing diagnosis to pathic pulmonary fibrosis in order to get her access to Esbriet and Ofev or get Esbriet from [place] and as add on Rx. Explained that this is only to prevent progression and the risk of GI side effects but given the constant GI issues she does not want to consider this therapy  - plan  - rov 3 months with spirometry    > 50% of this > 25 min visit spent in face to face counseling (15 min visit converted to 25 min)   Dr. Brand Males, M.D., [facility].C.P Pulmonary and Critical Care Medicine Staff Physician [facility] Pager: [phone], If no answer or between  15:00h - 7:00h: call [phone]  09/20/2014 4:13 AM

## 2014-09-18 NOTE — Progress Notes (Signed)
PFT done today. 

## 2014-09-18 NOTE — Patient Instructions (Addendum)
Go back to prednisone 5mg  per day  take levaquin 500mg  once daily  X 6 days  Followup  - 3 months; spirometry at followup

## 2014-09-19 ENCOUNTER — Other Ambulatory Visit: Payer: Self-pay | Admitting: Family Medicine

## 2014-09-19 DIAGNOSIS — R928 Other abnormal and inconclusive findings on diagnostic imaging of breast: Secondary | ICD-10-CM

## 2014-09-20 DIAGNOSIS — J849 Interstitial pulmonary disease, unspecified: Secondary | ICD-10-CM | POA: Insufficient documentation

## 2014-09-30 ENCOUNTER — Ambulatory Visit (INDEPENDENT_AMBULATORY_CARE_PROVIDER_SITE_OTHER): Payer: Medicare Other | Admitting: Emergency Medicine

## 2014-09-30 VITALS — BP 130/60 | HR 74 | Temp 97.7°F | Resp 20 | Ht 60.0 in | Wt 154.0 lb

## 2014-09-30 DIAGNOSIS — J8410 Pulmonary fibrosis, unspecified: Secondary | ICD-10-CM

## 2014-09-30 DIAGNOSIS — K219 Gastro-esophageal reflux disease without esophagitis: Secondary | ICD-10-CM

## 2014-09-30 MED ORDER — CLARITHROMYCIN ER 500 MG PO TB24: 1000.0000 mg | ORAL_TABLET | Freq: Every day | ORAL | Status: DC

## 2014-09-30 NOTE — Progress Notes (Signed)
[facility] [address] [place] [address] [phone]  Date:  09/30/2014   Name:  Rhonda [name]   DOB:  1951/08/15   MRN:  [id number]  PCP:  Ellsworth Lennox, MD    Chief Complaint: Shortness of Breath   History of Present Illness:  Rhonda [name] is a 63 y.o. very pleasant female patient who presents with the following:  Patient with well documented chronic lung disease and GERD Recently treated with a course of zithromax and then levaquin. Says her sputum changed from yellow to white with both antibiotics. Finished levaquin last week.  Now has increased sputum production and is again yellow. Not associated with increased wheezing or shortness of breath. No nausea or vomiting. Says GERD stable on prilosec. Tolerating prednisone 5 daily Seems confused as to why her lungs are not improving and she continues to cough up sputum Denies other complaint or health concern today.   Patient Active Problem List   Diagnosis Date Noted  . ILD (interstitial lung disease) 09/20/2014  . Upper respiratory infection 08/25/2014  . Irritable bowel syndrome (IBS) 02/08/2014  . Spontaneous pneumothorax- Right 08/08/2013  . High risk medication use 01/03/2013  . Osteopenia 10/28/2012  . Metabolic syndrome 96/22/2979  . Rapid heart rate   . Palpitations 12/16/2011  . 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 oil pain and ? mold in house 10/29/2011  . Snoring 10/29/2011  . HYPERLIPIDEMIA-MIXED 04/02/2010  . ABNORMAL EKG 04/02/2010  . DEPRESSION 03/27/2010  . MITRAL REGURGITATION 03/27/2010  . DYSPNEA ON EXERTION 03/27/2010  . Personal history of unspecified circulatory disease 03/27/2010    Past Medical History  Diagnosis Date  . High cholesterol   . History of chronic bronchitis     as child  . GERD (gastroesophageal reflux disease)   . Arthritis   . Depression   . Allergy   . Asthma   . Cataract   . Pneumonitis, hypersensitivity     a. 09/2012 s/p Bx - ? 2/2 bird, mold, oil paint exposure ->on steroids, followed by pulm.  . Rapid heart rate   . DOE (dyspnea on exertion)     a. 04/2010 Lexi MV EF 71%, no ischemia/infarct;    . Hyperplastic colon polyp 2007  . Hiatal hernia   . Interstitial lung disease   . Insulin resistance     past  . Inguinal hernia     right  . Pulmonary fibrosis   . Helicobacter pylori ab+     Past Surgical History  Procedure Laterality Date  . Tubal ligation    . Foot fracture surgery  2006 or 2007    right  . Video bronchoscopy  11/19/2011    Procedure: VIDEO BRONCHOSCOPY WITH FLUORO;  Surgeon: Brand Males, MD;  Location: [facility] ENDOSCOPY;  Service: Endoscopy;;  . Hymenectomy    . Cesarean section    . Video assisted thoracoscopy  09/28/2012    Procedure: VIDEO ASSISTED THORACOSCOPY;  Surgeon: Melrose Nakayama, MD;  Location: [place];  Service: Thoracic;  Laterality: Right;  . Lung biopsy  09/28/2012    Procedure: LUNG BIOPSY;  Surgeon: Melrose Nakayama, MD;  Location: [place];  Service: Thoracic;  Laterality: N/A;  lung biopsies tims three  . Breast biopsy Right 2009    BIOPSY    History  Substance Use Topics  . Smoking status: Never Smoker   . Smokeless tobacco: Never Used     Comment: pt states she experimented in college  .  Alcohol Use: 1.1 oz/week    1 Glasses of wine, 1 Not specified per week     Comment: once a wk. 1-2 glasses    Family History  Problem Relation Age of Onset  . Emphysema Maternal Grandmother   . Asthma Maternal Grandmother   . Asthma Mother   . Osteoarthritis Mother   . Dementia Mother 31  . Lymphoma Father   . Diabetes Father   . Hypertension Sister   . Heart disease Sister   . Kidney disease Sister   . Lung disease Maternal Grandfather   . Breast cancer Paternal Aunt   . Ovarian cancer Maternal Aunt   . Bone cancer Paternal Grandfather   . Colon cancer Neg Hx     Allergies  Allergen Reactions  . Atorvastatin Other (See Comments)     Leg pain  . Betadine [Povidone Iodine] Other (See Comments)    blisters  . Codeine Nausea And Vomiting  . Rosuvastatin Other (See Comments)    Leg pain  . Shellfish Allergy Other (See Comments)    vomiting  . Sulfonamide Derivatives Other (See Comments)    headaches    Medication list has been reviewed and updated.  Current Outpatient Prescriptions on File Prior to Visit  Medication Sig Dispense Refill  . acetaminophen (TYLENOL) 500 MG tablet Take 500 mg by mouth as needed.     . Alum Hydroxide-Mag Carbonate (GAVISCON PO) Take by mouth as needed.    . calcium carbonate (TUMS - DOSED IN MG ELEMENTAL CALCIUM) 500 MG chewable tablet Chew 2 tablets by mouth daily.    . Chlorphen-Pseudoephed-APAP (THERAFLU FLU/COLD PO) As directed as needed    . dextromethorphan (DELSYM) 30 MG/5ML liquid Take 15 mg by mouth as needed for cough.    Marland Kitchen guaiFENesin (MUCINEX) 600 MG 12 hr tablet Take 1,200 mg by mouth 2 (two) times daily.    Marland Kitchen lactase (LACTAID) 3000 UNITS tablet Take by mouth as needed.    Marland Kitchen levocetirizine (XYZAL) 5 MG tablet Take 1 tablet (5 mg total) by mouth every evening. PATIENT NEEDS OFFICE VISIT FOR ADDITIONAL REFILLS 30 tablet 0  . metoprolol succinate (TOPROL-XL) 50 MG 24 hr tablet TAKE 1 TABLET EVERY DAY IMMEDIATELY FOLLOWING A MEAL 90 tablet 3  . Multiple Vitamins-Minerals (AIRBORNE PO) Take by mouth as needed.    Marland Kitchen omeprazole (PRILOSEC) 20 MG capsule Take 1 capsule (20 mg total) by mouth daily. 30 capsule 5  . pravastatin (PRAVACHOL) 40 MG tablet Take 0.5 tablets (20 mg total) by mouth daily. PATIENT NEEDS OFFICE VISIT FOR ADDITIONAL REFILLS 45 tablet 0  . predniSONE (DELTASONE) 5 MG tablet Take 5 mg by mouth every Monday, Wednesday, and Friday.     . Probiotic Product (PROBIOTIC DAILY PO) Take by mouth. Take 2 gummies daily    . levofloxacin (LEVAQUIN) 500 MG tablet Take 1 tablet (500 mg total) by mouth daily. 6 tablet 0   No current facility-administered medications on file prior  to visit.    Review of Systems:  As per HPI, otherwise negative.    Physical Examination: Filed Vitals:   09/30/14 0857  BP: 130/60  Pulse: 74  Temp: 97.7 F (36.5 C)  Resp: 20   Filed Vitals:   09/30/14 0857  Height: 5' (1.524 m)  Weight: 154 lb (69.854 kg)   Body mass index is 30.08 kg/(m^2). Ideal Body Weight: Weight in (lb) to have BMI = 25: 127.7  GEN: WDWN, NAD, Non-toxic, A & O x 3 HEENT:  Atraumatic, Normocephalic. Neck supple. No masses, No LAD. Ears and Nose: No external deformity. CV: RRR, No M/G/R. No JVD. No thrill. No extra heart sounds. PULM: CTA B, no wheezes, crackles, rhonchi. No retractions. No resp. distress. No accessory muscle use. ABD: S, NT, ND, +BS. No rebound. No HSM. EXTR: No c/c/e NEURO Normal gait.  PSYCH: Normally interactive. Conversant. Not depressed or anxious appearing.  Calm demeanor.    Assessment and Plan: ILD GERD Trial biaxin Follow up with pulmonary Suggest she continue mucinex  Signed,  Ellison Carwin, MD

## 2014-10-01 ENCOUNTER — Encounter: Payer: Self-pay | Admitting: Internal Medicine

## 2014-10-01 ENCOUNTER — Ambulatory Visit (INDEPENDENT_AMBULATORY_CARE_PROVIDER_SITE_OTHER): Payer: Medicare Other | Admitting: Internal Medicine

## 2014-10-01 ENCOUNTER — Telehealth: Payer: Self-pay | Admitting: Emergency Medicine

## 2014-10-01 VITALS — BP 144/70 | HR 77 | Ht 60.0 in | Wt 155.0 lb

## 2014-10-01 DIAGNOSIS — J849 Interstitial pulmonary disease, unspecified: Secondary | ICD-10-CM

## 2014-10-01 DIAGNOSIS — J679 Hypersensitivity pneumonitis due to unspecified organic dust: Secondary | ICD-10-CM

## 2014-10-01 DIAGNOSIS — R062 Wheezing: Secondary | ICD-10-CM

## 2014-10-01 MED ORDER — ALBUTEROL SULFATE HFA 108 (90 BASE) MCG/ACT IN AERS: 2.0000 | INHALATION_SPRAY | Freq: Four times a day (QID) | RESPIRATORY_TRACT | Status: DC | PRN

## 2014-10-01 MED ORDER — PREDNISONE 10 MG PO TABS: ORAL_TABLET | ORAL | Status: DC

## 2014-10-01 NOTE — Telephone Encounter (Signed)
Pt had questions during appt but MR was in another room with pt and pt was unable to wait. Pt questioned if she should continue taking Mucinex as needed and if she needs albuterol hfa.  Per MR-continue with Mucinex and start albuterol.   Called and spoke to pt. Informed her of the recs per MR. Albuterol sent into pharm. Pt verbalized understanding and denied any further questions or concerns at this time.

## 2014-10-01 NOTE — Progress Notes (Signed)
Subjective:    Patient ID: Rhonda [name], female    DOB: [birth year], 63 y.o.   MRN: [id number]  HPI   FU ILD.HP   OV 10/01/2014  Chief Complaint  Patient presents with  . Follow-up    Pt was given abx at last OV and feels it helped but then the s/s came back. Pt c/o DOE, prod cough with white mucus, and chest soreness from cough.  [facility] and [facility] abx but has not started taking it yet.      Feels never beetter since sick contact mid sept 2015. That was in [place] . She slept in a damp basement then. Dr Morrison Old Zpak and pred helped. At fu my levaduin helped but soon after immediately worse and lot of wheezing, junky noiise in lungs, more dyspnea, more cough. Cough is severe. Descirbes cough at a whole new high level. Sinus congestion +. IN fact voice has nasal twang +. Feels tired. Wondering if another abx and pred couse will help. Does have positive post nasal drainage +. Again she and aher husband deny any bird or organic dust exposture at home.  Denies gerd is worse. Went to urgent care yesterday and apparently lungs cler. Given biaxin but not taken it yet. Wants my advice.   Review of Systems  Constitutional: Negative for fever and unexpected weight change.  HENT: Negative for congestion, dental problem, ear pain, nosebleeds, postnasal drip, rhinorrhea, sinus pressure, sneezing, sore throat and trouble swallowing.   Eyes: Negative for redness and itching.  Respiratory: Positive for cough and shortness of breath. Negative for chest tightness and wheezing.   Cardiovascular: Negative for palpitations and leg swelling.  Gastrointestinal: Negative for nausea and vomiting.  Genitourinary: Negative for dysuria.  Musculoskeletal: Negative for joint swelling.  Skin: Negative for rash.  Neurological: Negative for headaches.  Hematological: Does not bruise/bleed easily.  Psychiatric/Behavioral: Negative for dysphoric mood. The patient is not nervous/anxious.     Current outpatient prescriptions: acetaminophen (TYLENOL) 500 MG tablet, Take 500 mg by mouth as needed. , Disp: , Rfl: ;  Alum Hydroxide-Mag Carbonate (GAVISCON PO), Take by mouth as needed., Disp: , Rfl: ;  calcium carbonate (TUMS - DOSED IN MG ELEMENTAL CALCIUM) 500 MG chewable tablet, Chew 2 tablets by mouth daily., Disp: , Rfl: ;  dextromethorphan (DELSYM) 30 MG/5ML liquid, Take 15 mg by mouth as needed for cough., Disp: , Rfl:  guaiFENesin (MUCINEX) 600 MG 12 hr tablet, Take 1,200 mg by mouth 2 (two) times daily., Disp: , Rfl: ;  lactase (LACTAID) 3000 UNITS tablet, Take by mouth as needed., Disp: , Rfl: ;  levocetirizine (XYZAL) 5 MG tablet, Take 1 tablet (5 mg total) by mouth every evening. PATIENT NEEDS OFFICE VISIT FOR ADDITIONAL REFILLS, Disp: 30 tablet, Rfl: 0 metoprolol succinate (TOPROL-XL) 50 MG 24 hr tablet, TAKE 1 TABLET EVERY DAY IMMEDIATELY FOLLOWING A MEAL, Disp: 90 tablet, Rfl: 3;  mometasone (ASMANEX) 220 MCG/INH inhaler, Inhale 2 puffs into the lungs as needed., Disp: , Rfl: ;  Multiple Vitamins-Minerals (AIRBORNE PO), Take by mouth as needed., Disp: , Rfl: ;  omeprazole (PRILOSEC) 20 MG capsule, Take 1 capsule (20 mg total) by mouth daily., Disp: 30 capsule, Rfl: 5 pravastatin (PRAVACHOL) 40 MG tablet, Take 0.5 tablets (20 mg total) by mouth daily. PATIENT NEEDS OFFICE VISIT FOR ADDITIONAL REFILLS, Disp: 45 tablet, Rfl: 0;  predniSONE (DELTASONE) 5 MG tablet, Take 5 mg by mouth daily. , Disp: , Rfl: ;  Probiotic Product (  PROBIOTIC DAILY PO), Take by mouth. Take 2 gummies daily, Disp: , Rfl:  clarithromycin (BIAXIN XL) 500 MG 24 hr tablet, Take 2 tablets (1,000 mg total) by mouth daily., Disp: 20 tablet, Rfl: 0     Objective:   Physical Exam   Filed Vitals:   10/01/14 1430  BP: 144/70  Pulse: 77  Height: 5' (1.524 m)  Weight: 155 lb (70.308 kg)  SpO2: 96%   itals reviewed. Constitutional: She is oriented to person, place, and time. She appears well-developed and  well-nourished. No distress.  HENT:  Head: Normocephalic and atraumatic.  Right Ear: External ear normal.  Left Ear: External ear normal.  Mouth/Throat: Oropharynx is clear and moist. No oropharyngeal exudate.  Eyes: Conjunctivae and EOM are normal. Pupils are equal, round, and reactive to light. Right eye exhibits no discharge. Left eye exhibits no discharge. No scleral icterus.  Neck: Normal range of motion. Neck supple. No JVD present. No tracheal deviation present. No thyromegaly present.  Cardiovascular: Normal rate, regular rhythm, normal heart sounds and intact distal pulses.  Exam reveals no gallop and no friction rub.   No murmur heard. Pulmonary/Chest: Effort normal. No respiratory distress. She has no wheezes. She has rales but she is also got new bilateral diffuse wheeze. She exhibits no tenderness.  Abdominal: Soft. Bowel sounds are normal. She exhibits no distension and no mass. There is no tenderness. There is no rebound and no guarding.  Musculoskeletal: Normal range of motion. She exhibits no edema and no tenderness.  Lymphadenopathy:    She has no cervical adenopathy.  Neurological: She is alert and oriented to person, place, and time. She has normal reflexes. No cranial nerve deficit. She exhibits normal muscle tone. Coordination normal.  Skin: Skin is warm and dry. No rash noted. She is not diaphoretic. No erythema. No pallor.  Psychiatric: She has a normal mood and affect. Her behavior is normal. Judgment and thought content normal.         Assessment & Plan:     ICD-9-CM ICD-10-CM   1. ILD (interstitial lung disease) 515 J84.9   2. Hypersensitivity pneumonitis 495.9 J67.9   3. Wheezing 786.07 R06.2      DDx    ? ILD flare -  ? HP flare - ? continued exposure to antigen but she denies,   ? Post viral reactive cough,   ? Neurogenic cough   ACute wheezing - do spirometry breathing test today  - Ok to take biaxin given by urgent care for 10 days  - In  addition take Please take Take prednisone 40mg  once daily x 3 days, then 30mg  once daily x 3 days, then 20mg  once daily x 3 days, then prednisone 10mg  once daily  x 3 days and  Then 5mg  daily  Followup  - 4 weeks with my NP Tammy Parrett  - spirometry at followup  - if still symptomatic needs HRCT chest   Future Appointments Date Time Provider [place]  11/07/2014 11:00 AM Tammy Jeralene Huff, NP [facility] None  12/19/2014 9:00 AM Brand Males, MD [facility] None     Dr. Brand Males, M.D., [facility].C.P Pulmonary and Critical Care Medicine Staff Physician [place] [facility] Pager: [phone], If no answer or between  15:00h - 7:00h: call [phone]  10/20/2014 10:51 PM

## 2014-10-01 NOTE — Patient Instructions (Addendum)
ACute wheezing - do spirometry breathing test today  - Ok to take biaxin given by urgent care for 10 days  - In addition take Please take Take prednisone 40mg  once daily x 3 days, then 30mg  once daily x 3 days, then 20mg  once daily x 3 days, then prednisone 10mg  once daily  x 3 days and  Then 5mg  daily  Followup  - 4 weeks with my NP Tammy Parrett  - spirometry at followup  - if still symptomatic needs HRCT chest

## 2014-10-02 ENCOUNTER — Ambulatory Visit
Admission: RE | Admit: 2014-10-02 | Discharge: 2014-10-02 | Disposition: A | Discharge status: Home / Self Care | Payer: Medicare Other | Source: Ambulatory Visit | Attending: Family Medicine | Admitting: Family Medicine

## 2014-10-02 DIAGNOSIS — R928 Other abnormal and inconclusive findings on diagnostic imaging of breast: Secondary | ICD-10-CM

## 2014-10-12 ENCOUNTER — Encounter: Payer: Self-pay | Admitting: Family Medicine

## 2014-10-12 ENCOUNTER — Ambulatory Visit (INDEPENDENT_AMBULATORY_CARE_PROVIDER_SITE_OTHER): Payer: Medicare Other | Admitting: Family Medicine

## 2014-10-12 VITALS — BP 110/60 | HR 60 | Temp 98.5°F | Resp 16 | Ht 60.5 in | Wt 151.6 lb

## 2014-10-12 DIAGNOSIS — M8580 Other specified disorders of bone density and structure, unspecified site: Secondary | ICD-10-CM

## 2014-10-12 DIAGNOSIS — E782 Mixed hyperlipidemia: Secondary | ICD-10-CM

## 2014-10-12 DIAGNOSIS — R739 Hyperglycemia, unspecified: Secondary | ICD-10-CM

## 2014-10-12 LAB — LIPID PANEL
CHOL/HDL RATIO: 3.5 ratio
CHOLESTEROL: 176 mg/dL (ref 0–200)
HDL: 50 mg/dL (ref >39)
LDL Cholesterol: 79 mg/dL (ref 0–99)
Triglycerides: 236 mg/dL — ABNORMAL HIGH (ref <150)
VLDL: 47 mg/dL — ABNORMAL HIGH (ref 0–40)

## 2014-10-12 LAB — BASIC METABOLIC PANEL
BUN: 16 mg/dL (ref 6–23)
CALCIUM: 9.1 mg/dL (ref 8.4–10.5)
CHLORIDE: 101 meq/L (ref 96–112)
CO2: 29 meq/L (ref 19–32)
CREATININE: 1.04 mg/dL (ref 0.50–1.10)
Glucose, Bld: 78 mg/dL (ref 70–99)
Potassium: 3.7 mEq/L (ref 3.5–5.3)
SODIUM: 139 meq/L (ref 135–145)

## 2014-10-12 LAB — ALT: ALT: 17 U/L (ref 0–35)

## 2014-10-12 MED ORDER — PRAVASTATIN SODIUM 40 MG PO TABS: 20.0000 mg | ORAL_TABLET | Freq: Every day | ORAL | Status: DC

## 2014-10-12 MED ORDER — ALENDRONATE SODIUM 70 MG PO TABS: 70.0000 mg | ORAL_TABLET | ORAL | Status: DC

## 2014-10-12 NOTE — Patient Instructions (Addendum)
Osteoporosis Throughout your life, your body breaks down old bone and replaces it with new bone. As you get older, your body does not replace bone as quickly as it breaks it down. By the age of 30 years, most people begin to gradually lose bone because of the imbalance between bone loss and replacement. Some people lose more bone than others. Bone loss beyond a specified normal degree is considered osteoporosis.  Osteoporosis affects the strength and durability of your bones. The inside of the ends of your bones and your flat bones, like the bones of your pelvis, look like honeycomb, filled with tiny open spaces. As bone loss occurs, your bones become less dense. This means that the open spaces inside your bones become bigger and the walls between these spaces become thinner. This makes your bones weaker. Bones of a person with osteoporosis can become so weak that they can break (fracture) during minor accidents, such as a simple fall. CAUSES  The following factors have been associated with the development of osteoporosis:  Smoking.  Drinking more than 2 alcoholic drinks several days per week.  Long-term use of certain medicines:  Corticosteroids.  Chemotherapy medicines.  Thyroid medicines.  Antiepileptic medicines.  Gonadal hormone suppression medicine.  Immunosuppression medicine.  Being underweight.  Lack of physical activity.  Lack of exposure to the sun. This can lead to vitamin D deficiency.  Certain medical conditions:  Certain inflammatory bowel diseases, such as Crohn disease and ulcerative colitis.  Diabetes.  Hyperthyroidism.  Hyperparathyroidism. RISK FACTORS Anyone can develop osteoporosis. However, the following factors can increase your risk of developing osteoporosis:  Gender--Women are at higher risk than men.  Age--Being older than 50 years increases your risk.  Ethnicity--White and Asian people have an increased risk.  Weight --Being extremely  underweight can increase your risk of osteoporosis.  Family history of osteoporosis--Having a family member who has developed osteoporosis can increase your risk. SYMPTOMS  Usually, people with osteoporosis have no symptoms.  DIAGNOSIS  Signs during a physical exam that may prompt your caregiver to suspect osteoporosis include:  Decreased height. This is usually caused by the compression of the bones that form your spine (vertebrae) because they have weakened and become fractured.  A curving or rounding of the upper back (kyphosis). To confirm signs of osteoporosis, your caregiver may request a procedure that uses 2 low-dose X-ray beams with different levels of energy to measure your bone mineral density (dual-energy X-ray absorptiometry [DXA]). Also, your caregiver may check your level of vitamin D. TREATMENT  The goal of osteoporosis treatment is to strengthen bones in order to decrease the risk of bone fractures. There are different types of medicines available to help achieve this goal. Some of these medicines work by slowing the processes of bone loss. Some medicines work by increasing bone density. Treatment also involves making sure that your levels of calcium and vitamin D are adequate. PREVENTION  There are things you can do to help prevent osteoporosis. Adequate intake of calcium and vitamin D can help you achieve optimal bone mineral density. Regular exercise can also help, especially resistance and weight-bearing activities. If you smoke, quitting smoking is an important part of osteoporosis prevention. MAKE SURE YOU:  Understand these instructions.  Will watch your condition.  Will get help right away if you are not doing well or get worse. FOR MORE INFORMATION www.osteo.org and www.nof.org Document Released: 08/19/2005 Document Revised: 03/06/2013 Document Reviewed: 10/24/2011 Exit[employer] Patient Information 2015 [employer]. This information is not   intended to replace advice  given to you by your health care provider. Make sure you discuss any questions you have with your health care provider.   This is an updated summary of your visit. I had to delete the medications that you are not taking and it reflects the labs drawn today.  Enjoy The Holidays and see you next year!!!

## 2014-10-13 LAB — THYROID PANEL WITH TSH
Free Thyroxine Index: 1.9 (ref 1.4–3.8)
T3 UPTAKE: 30 % (ref 22–35)
T4 TOTAL: 6.3 ug/dL (ref 4.5–12.0)
TSH: 3.972 u[IU]/mL (ref 0.350–4.500)

## 2014-10-16 ENCOUNTER — Other Ambulatory Visit: Payer: Self-pay | Admitting: Family Medicine

## 2014-10-16 NOTE — Progress Notes (Signed)
Subjective:    Patient ID: Rhonda [name], female    DOB: June 14, 1951, 63 y.o.   MRN: [id number]  HPI  This 63 y.o. Cauc female is here to discuss abnormal DEXA and she requests prescription to start Fosamax. She takes chronic steroid for Interstitial Lung Disease, managed by Dr. Chase Caller and doubts that she will ever be able to discontinue Prednisone.  Current infection has required 3 courses of antibiotics. She c/o persistent prod cough w/ thick white sputum; she wonders if she has yeast infection. Pt does not report red, sore tongue or sore throat w/ dysphagia. She will discuss this w/ pulmonologist at next visit.  Pt has made some significant changes in her diet (elimnation of offending foods) and GI symptoms have almost completely resolved.   Pt has chronic hyperlipidemia, taking pravastatin 40 mg daily w/o adverse effects. She denies myalgias, GI upset, back pain or CNS effects. She has not been exercising lately due to lung symptoms but will resume swimming soon.  Patient Active Problem List   Diagnosis Date Noted  . ILD (interstitial lung disease) 09/20/2014  . Upper respiratory infection 08/25/2014  . Irritable bowel syndrome (IBS) 02/08/2014  . Spontaneous pneumothorax- Right 08/08/2013  . High risk medication use 01/03/2013  . Osteopenia 10/28/2012  . Metabolic syndrome 87/68/1157  . 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 oil pain and ? mold in house 10/29/2011  . Snoring 10/29/2011  . HYPERLIPIDEMIA-MIXED 04/02/2010  . ABNORMAL EKG 04/02/2010  . DEPRESSION 03/27/2010  . MITRAL REGURGITATION 03/27/2010  . DYSPNEA ON EXERTION 03/27/2010     Prior to Admission medications   Medication Sig Start Date End Date Taking? Authorizing Provider  acetaminophen (TYLENOL) 500 MG tablet Take 500 mg by mouth as needed.    Yes Historical Provider, MD  Alpha-D-Galactosidase (BEANO PO) Take by mouth as needed.   Yes Historical Provider, MD  calcium carbonate (TUMS -  DOSED IN MG ELEMENTAL CALCIUM) 500 MG chewable tablet Chew 2 tablets by mouth daily.   Yes Historical Provider, MD  lactase (LACTAID) 3000 UNITS tablet Take by mouth as needed.   Yes Historical Provider, MD  metoprolol succinate (TOPROL-XL) 50 MG 24 hr tablet TAKE 1 TABLET EVERY DAY IMMEDIATELY FOLLOWING A MEAL 04/18/14  Yes Mancel Bale, PA-C  Multiple Vitamins-Minerals (AIRBORNE PO) Take by mouth as needed.   Yes Historical Provider, MD  omeprazole (PRILOSEC) 20 MG capsule Take 1 capsule (20 mg total) by mouth daily. 03/09/13  Yes Barton Fanny, MD  pravastatin (PRAVACHOL) 40 MG tablet Take 0.5 tablets (20 mg total) by mouth daily. 10/12/14  Yes Barton Fanny, MD  predniSONE (DELTASONE) 10 MG tablet Take 40mg  for 3 days, then 30mg  for 3 days, 20mg  for 3 days, 10mg  for 3 days, then stop 10/01/14  Yes Brand Males, MD  Probiotic Product (PROBIOTIC DAILY PO) Take by mouth. Take 2 gummies daily   Yes Historical Provider, MD  Alum Hydroxide-Mag Carbonate (GAVISCON PO) Take by mouth as needed.    Historical Provider, MD  levocetirizine (XYZAL) 5 MG tablet Take 1 tablet (5 mg total) by mouth every evening.    Barton Fanny, MD  mometasone [facility]) 220 MCG/INH inhaler Inhale 2 puffs into the lungs as needed.    Historical Provider, MD    SOC and FAM Hx reviewed.   Review of Systems  Constitutional: Positive for activity change. Negative for fever, chills and fatigue.  HENT: Negative.   Respiratory: Positive for cough and shortness of  breath.   Cardiovascular: Negative.   Gastrointestinal: Negative.   Musculoskeletal: Negative.   Neurological: Negative.   Psychiatric/Behavioral: Negative.       Objective:   Physical Exam  Constitutional: She is oriented to person, place, and time. Vital signs are normal. She appears well-developed and well-nourished.  HENT:  Head: Normocephalic and atraumatic.  Right Ear: External ear normal.  Left Ear: External ear normal.  Nose: Nose  normal.  Mouth/Throat: Oropharynx is clear and moist. No oropharyngeal exudate.  Eyes: Conjunctivae are normal. Pupils are equal, round, and reactive to light. No scleral icterus.  Neck: Normal range of motion. Neck supple. No thyromegaly present.  Cardiovascular: Normal rate, regular rhythm and normal heart sounds.   Pulmonary/Chest: Effort normal and breath sounds normal. No respiratory distress.  Musculoskeletal: Normal range of motion. She exhibits no edema or tenderness.  Lymphadenopathy:    She has no cervical adenopathy.  Neurological: She is alert and oriented to person, place, and time. No cranial nerve deficit. She exhibits normal muscle tone. Coordination normal.  Skin: Skin is warm and dry. No rash noted. No erythema. No pallor.  Psychiatric: She has a normal mood and affect. Her behavior is normal. Judgment and thought content normal.  Nursing note and vitals reviewed.      Assessment & Plan:  Osteopenia- Start Fosamax 70 mg 1 tablet weekly.  Hyperlipidemia, mixed -  Stable on statin w/o side effects; continue current dose pending labs. Plan: Lipid panel, Thyroid Panel With TSH, ALT  Hyperglycemia - Monitor blood sugar due to chronic steroid use. Plan: Basic metabolic panel  Meds ordered this encounter  Medications  . pravastatin (PRAVACHOL) 40 MG tablet    Sig: Take 0.5 tablets (20 mg total) by mouth daily.    Dispense:  45 tablet    Refill:  3  . alendronate (FOSAMAX) 70 MG tablet    Sig: Take 1 tablet (70 mg total) by mouth every 7 (seven) days. Take with a full glass of water on an empty stomach.    Dispense:  4 tablet    Refill:  5

## 2014-10-20 DIAGNOSIS — R062 Wheezing: Secondary | ICD-10-CM | POA: Insufficient documentation

## 2014-11-07 ENCOUNTER — Ambulatory Visit (INDEPENDENT_AMBULATORY_CARE_PROVIDER_SITE_OTHER): Payer: Medicare Other | Admitting: Adult Health

## 2014-11-07 ENCOUNTER — Encounter: Payer: Self-pay | Admitting: Adult Health

## 2014-11-07 ENCOUNTER — Ambulatory Visit (INDEPENDENT_AMBULATORY_CARE_PROVIDER_SITE_OTHER)
Admission: RE | Admit: 2014-11-07 | Discharge: 2014-11-07 | Disposition: A | Discharge status: Home / Self Care | Payer: Medicare Other | Source: Ambulatory Visit | Attending: Adult Health | Admitting: Adult Health

## 2014-11-07 ENCOUNTER — Other Ambulatory Visit: Payer: Self-pay | Admitting: Family Medicine

## 2014-11-07 VITALS — BP 112/74 | HR 66 | Temp 98.3°F | Ht 60.0 in | Wt 158.0 lb

## 2014-11-07 DIAGNOSIS — J679 Hypersensitivity pneumonitis due to unspecified organic dust: Secondary | ICD-10-CM

## 2014-11-07 DIAGNOSIS — J849 Interstitial pulmonary disease, unspecified: Secondary | ICD-10-CM

## 2014-11-07 NOTE — Assessment & Plan Note (Signed)
?  flare vs post viral cough >Improved with abx and steroids   Plan  Chest Xray today   Continue prilosec 20 mg Take 30-60 min before first meal of the day and pepcid ac 20 mg after supper or bedtime  For cough > delsym 2 tsp every 12 hours as needed   For drainage > chlortrimeton (chlorpheniramine) 4 mg every 4 hours available over the counter (may cause drowsiness)  May use 2 tabs At bedtime  As needed    Continue on Xyzal or Allegra daily   Hold Fosamax for   1  month .   GERD (REFLUX)  is an extremely common cause of respiratory symptoms, many times with no significant heartburn at all.    It can be treated with medication, but also with lifestyle changes including avoidance of late meals, excessive alcohol, smoking cessation, and avoid fatty foods, chocolate, peppermint, colas, red wine, and acidic juices such as orange juice.  NO MINT OR MENTHOL PRODUCTS SO NO COUGH DROPS  USE SUGARLESS CANDY INSTEAD (jolley ranchers or Stover's or life savers or ice cubes) NO OIL BASED VITAMINS - use powdered substitutes.

## 2014-11-07 NOTE — Patient Instructions (Signed)
Chest Xray today   Continue prilosec 20 mg Take 30-60 min before first meal of the day and pepcid ac 20 mg after supper or bedtime  For cough > delsym 2 tsp every 12 hours as needed   For drainage > chlortrimeton (chlorpheniramine) 4 mg every 4 hours available over the counter (may cause drowsiness)  May use 2 tabs At bedtime  As needed    Continue on Xyzal or Allegra daily   Hold Fosamax for   1  month .   GERD (REFLUX)  is an extremely common cause of respiratory symptoms, many times with no significant heartburn at all.    It can be treated with medication, but also with lifestyle changes including avoidance of late meals, excessive alcohol, smoking cessation, and avoid fatty foods, chocolate, peppermint, colas, red wine, and acidic juices such as orange juice.  NO MINT OR MENTHOL PRODUCTS SO NO COUGH DROPS  USE SUGARLESS CANDY INSTEAD (jolley ranchers or Stover's or life savers or ice cubes) NO OIL BASED VITAMINS - use powdered substitutes.   Follow up Dr. Chase Caller in 3 months and As needed

## 2014-11-07 NOTE — Progress Notes (Signed)
Subjective:    Patient ID: Rhonda [name], female    DOB: [birth year], 63 y.o.   MRN: [id number]  HPI   FU ILD.HP   OV 10/01/2014 Chief Complaint  Patient presents with  . Follow-up    Pt was given abx at last OV and feels it helped but then the s/s came back. Pt c/o DOE, prod cough with white mucus, and chest soreness from cough.  [facility] and was prescribed abx but has not started taking it yet.   Feels never beetter since sick contact mid sept 2015. That was in [place] . She slept in a damp basement then. Dr Morrison Old Zpak and pred helped. At fu my levaduin helped but soon after immediately worse and lot of wheezing, junky noiise in lungs, more dyspnea, more cough. Cough is severe. Descirbes cough at a whole new high level. Sinus congestion +. IN fact voice has nasal twang +. Feels tired. Wondering if another abx and pred couse will help. Does have positive post nasal drainage +. Again she and aher husband deny any bird or organic dust exposture at home.  Denies gerd is worse. Went to urgent care yesterday and apparently lungs cler. Given biaxin but not taken it yet. Wants my advice.    11/07/2014 Follow up Pt returns for follow up  Was seen for sick visit last ov with persistent cough and congestion x 3 months despite zpack.and pred taper.   She was given steroid taper  For possible post viral cough /? HP flare  Reports breathing is much improved after the Biaxin and Prednisone but still having prod cough white mucus.   Ddenies any wheezing, tightness, increased SOB, f/c/s, n/v/d, hemoptysis.   Feels a lot better.  Spirometry today is normal without sign change from previous ones.  FEV1 73%, ratio 80, , FVC 73%.    Review of Systems  Constitutional: Negative for fever and unexpected weight change.  HENT: Negative for congestion, dental problem, ear pain, nosebleeds, postnasal drip, rhinorrhea, sinus pressure, sneezing, sore throat and trouble swallowing.   Eyes: Negative  for redness and itching.  Respiratory: Positive for cough  Negative for chest tightness and wheezing.   Cardiovascular: Negative for palpitations and leg swelling.  Gastrointestinal: Negative for nausea and vomiting.  Genitourinary: Negative for dysuria.  Musculoskeletal: Negative for joint swelling.  Skin: Negative for rash.  Neurological: Negative for headaches.  Hematological: Does not bruise/bleed easily.  Psychiatric/Behavioral: Negative for dysphoric mood. The patient is not nervous/anxious.        Objective:   Physical Exam    itals reviewed. Constitutional: She is oriented to person, place, and time. She appears well-developed and well-nourished. No distress.  HENT:  Head: Normocephalic and atraumatic.  Right Ear: External ear normal.  Left Ear: External ear normal.  Mouth/Throat: Oropharynx is clear and moist. No oropharyngeal exudate.  Eyes: Conjunctivae and EOM are normal. Pupils are equal, round, and reactive to light. Right eye exhibits no discharge. Left eye exhibits no discharge. No scleral icterus.  Neck: Normal range of motion. Neck supple. No JVD present. No tracheal deviation present. No thyromegaly present.  Cardiovascular: Normal rate, regular rhythm, normal heart sounds and intact distal pulses.  Exam reveals no gallop and no friction rub.   No murmur heard. Pulmonary/Chest: Effort normal. No respiratory distress. She has no wheezes.   She exhibits no tenderness.  Abdominal: Soft. Bowel sounds are normal. She exhibits no distension and no mass. There is no tenderness.  There is no rebound and no guarding.  Musculoskeletal: Normal range of motion. She exhibits no edema and no tenderness.  Lymphadenopathy:    She has no cervical adenopathy.  Neurological: She is alert and oriented to person, place, and time. She has normal reflexes. No cranial nerve deficit. She exhibits normal muscle tone. Coordination normal.  Skin: Skin is warm and dry. No rash noted. She is  not diaphoretic. No erythema. No pallor.  Psychiatric: She has a normal mood and affect. Her behavior is normal. Judgment and thought content normal.

## 2014-11-07 NOTE — Addendum Note (Signed)
Addended by: Parke Poisson E on: 11/07/2014 12:20 PM   Modules accepted: Orders

## 2014-11-09 ENCOUNTER — Other Ambulatory Visit: Payer: Self-pay | Admitting: Family Medicine

## 2014-11-10 ENCOUNTER — Other Ambulatory Visit: Payer: Self-pay | Admitting: Family Medicine

## 2014-12-19 ENCOUNTER — Other Ambulatory Visit: Payer: Self-pay | Admitting: Internal Medicine

## 2014-12-19 ENCOUNTER — Ambulatory Visit (INDEPENDENT_AMBULATORY_CARE_PROVIDER_SITE_OTHER): Payer: Medicare Other | Admitting: Internal Medicine

## 2014-12-19 ENCOUNTER — Encounter: Payer: Self-pay | Admitting: Internal Medicine

## 2014-12-19 VITALS — BP 120/64 | HR 72 | Ht 61.0 in | Wt 156.0 lb

## 2014-12-19 DIAGNOSIS — J849 Interstitial pulmonary disease, unspecified: Secondary | ICD-10-CM

## 2014-12-19 DIAGNOSIS — R059 Cough, unspecified: Secondary | ICD-10-CM | POA: Insufficient documentation

## 2014-12-19 DIAGNOSIS — R0789 Other chest pain: Secondary | ICD-10-CM

## 2014-12-19 DIAGNOSIS — R05 Cough: Secondary | ICD-10-CM

## 2014-12-19 DIAGNOSIS — J679 Hypersensitivity pneumonitis due to unspecified organic dust: Secondary | ICD-10-CM

## 2014-12-19 DIAGNOSIS — R053 Chronic cough: Secondary | ICD-10-CM

## 2014-12-19 NOTE — Patient Instructions (Addendum)
ICD-9-CM ICD-10-CM   1. ILD (interstitial lung disease) 515 J84.9   2. Hypersensitivity pneumonitis 495.9 J67.9   3. Right-sided chest wall pain 786.52 R07.89   4. Chronic cough 786.2 R05    #ILD  - appears stable objectively  but subjectivel you seem more short of breath  - continue prednisone 5mg  per day  - refer pulmonary rehab; should give US objective data and help improve fitness for future travel - handicap sticker for car - do HRCT chest in 3 months (last one was sept 2014) - Do PFT in 3 months  #Right chest wall pain  - not sure why; could be due to ILD   #Chronic cough  - likely  Due to residual post viral reactive cough from fall 2015, post nasal drainage, ILD And Cough neuropathy  - continue cetrizine  - START generic fluticasone inhaler 2 squirts each nostril daily - at all times when you have urge to clear throat  - drink water or suck on sugarless lozenge - if persists, then will start gabapentin  #Followup  - full PFT 3 months  - HRCT 3 months  - return to see me  In 3 months - call or come sooner if cough or chest pain continue to be problems

## 2014-12-19 NOTE — Progress Notes (Signed)
Subjective:    Patient ID: Rhonda [name], female    DOB: 04-May-1951, 64 y.o.   MRN: [id number]  HPI    #GE reflux with small hiatal hernia  - on ppi  #MEtabolic Syndrome  - On low glycemic diet  159#  on 09/19/12 147# on 11/21/12 145# on 01/02/13 145# on 02/28/2013 147# - on 05/25/2013 154# - 09/18/14   #History of rapid heart rate not otherwise specified  - Start her on Lopressor  2008/2009 by primary care physician. History appears to correlate with onset of respiratory issues  - refuses to dc this drug as of 2014 discussion   # Hypersensitivity Pneumonitis and ILD  - Potential etiologies - cockateil x 2 for 18 years through end 2012. 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 Denies mold but lives in house built in [place] with a "weird baselment: and has humidifier  - first noted on CT chest 10/15/09 following trip to [place] (PE negative) but not described in 2003 CT chest report  - autoimmune panel: 10/23/11: Negative  -  Uderwennt bronch 11/19/11  - non diagnostic  - VATS Nov 2013 (done after initially refusing)- [facility]. However, there is worrying trend of UIP pattern in the Upper lobes.            Oct/Nov 2012  March 2013 08/11/12  Nov/Dec 2013 Dx HP/UIP 01/02/2013  02/28/13 05/25/2013  12/06/2013  03/15/2014  06/29/2014  10'27/15 12/19/2014   Symp/Signs Dyspnea x5y  New crackles  dimished dyspnea except at hill. Improved crackles.  Improved dyspnea except @ hill        Fev1  1.9 L   1.7L/90%  1.66 L/87%   1.66 L/78%  1.75L/90% 1.68L/82% 1.56/75 1.6L/78% 1.48/75% 1.58L/77%   FVC  2.6 L    2.2 L/82%   2.1 L/80%   2.19 L/82%  2.2L/91% 2.1/81% 1.86/69% 1.9L/71% 1.86/75% 1.75L/65% 1.82/73% (dec 2015 offce spiro)  Ratio     79   76/95%  80/99% 80/100%  84  90   fef 25-75%              TLC  4.3 L    3.3 L/79%  3.5 L/82%     3.22/72% 3.06L/68%  3.06/68%   DLCO   10/50%    10/51%   9.6/51%     11.9/63% 11.7/62%  11/561%   Walk test 185 ft x 3 laps on RA  rest 92%. Pk exertion 90%. Pk HR 108 Rest 92%. Pk exertion 86% at 2.5 laps. Pk HR 102  pk exertion - 88% Rest 96%. Pk 95%. Pk HR is 75. Done at full dose lopressor Rest 94%. Pk 91%. Rest HR 93 with pk HR 103/min. Done at half dose lopressor REst 96%. Pk pulse ox 92%. Rest HR 65. Pk HR 86 Did not desat. Normal Pk HR  Pulse ox 96%, droped to89% at 2nd lap but bounced up and ended at 91% 3rd lap Did not desat Did not desat, Pk HHR 93  CT chest   unchanged           Tests/Bx Bronch 0- non diagnostic   VATS - HP with UIP in Upper lobe          RX    Rx pred start 11/23/12 On  pred 30mg  per day and will reduce to 20mg  On Pred 20mg  per day On pred 10mg  per day  On pred 5mg  per day Rx pred 5mg  daily She reduced to pred 5mg  x past 1 month QOD due to GI concern side effects On pred 5mg  qod but will increase to 5/d after this visit On pred 5mg  daily                  OV 12/19/2014  Chief Complaint  Patient presents with  . Follow-up    Pt stated she is much improved since being sick. Pt c/o DOE d/t cold weather, prod cough with white mucus, PND and epigastric pain.     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 Now cough has improved to near baseline.  Stil bothered by the residual cough which seems to have significant neurogenic component (cleras throat, gag etc., ). Dyspnea on exertion is class 2 and stable compated to last visit with stable spirometry and walking desat test. However, husband feels over time pst few years dyspnea is progressive. She is very afraid of even the mildes incline. Swimming poses problem for dyspnea and cough. Never been to rehab due to daily swimming. Now worried that dyspnea/cough can impact desire to travel abroad. Also, seems to be having non specific msk tupe right chest wall pain for  many months.   Past, Family, Social reviewed: no change since last visit    Review of Systems  Constitutional: Negative for fever and unexpected weight change.  HENT: Positive for postnasal drip. Negative for congestion, dental problem, ear pain, nosebleeds, rhinorrhea, sinus pressure, sneezing, sore throat and trouble swallowing.   Eyes: Negative for redness and itching.  Respiratory: Positive for cough and shortness of breath. Negative for chest tightness and wheezing.   Cardiovascular: Negative for palpitations and leg swelling.  Gastrointestinal: Negative for nausea and vomiting.  Genitourinary: Negative for dysuria.  Musculoskeletal: Negative for joint swelling.  Skin: Negative for rash.  Neurological: Negative for headaches.  Hematological: Does not bruise/bleed easily.  Psychiatric/Behavioral: Negative for dysphoric mood. The patient is not nervous/anxious.     Current outpatient prescriptions:  .  acetaminophen (TYLENOL) 500 MG tablet, Take 500 mg by mouth as needed. , Disp: , Rfl:  .  alendronate (FOSAMAX) 70 MG tablet, Take 1 tablet (70 mg total) by mouth every 7 (seven) days. Take with a full glass of water on an empty stomach., Disp: 4 tablet, Rfl: 5 .  Alum Hydroxide-Mag Carbonate (GAVISCON PO), Take by mouth as needed., Disp: , Rfl:  .  calcium carbonate (TUMS - DOSED IN MG ELEMENTAL CALCIUM) 500 MG chewable tablet, Chew 2 tablets by mouth daily., Disp: , Rfl:  .  cetirizine (ZYRTEC) 10 MG tablet, Take 10 mg by mouth daily., Disp: , Rfl:  .  lactase (LACTAID) 3000 UNITS tablet, Take by mouth as needed., Disp: , Rfl:  .  levocetirizine (XYZAL) 5 MG tablet, Take 1 tablet (5 mg total) by mouth every evening., Disp: 30 tablet, Rfl: 11 .  metoprolol succinate (TOPROL-XL) 50 MG 24 hr tablet, TAKE 1 TABLET EVERY DAY IMMEDIATELY FOLLOWING A MEAL, Disp: 90 tablet, Rfl: 3 .  Multiple Vitamins-Minerals (AIRBORNE PO), Take by mouth as needed., Disp: , Rfl:  .  omeprazole (PRILOSEC)  20 MG capsule, Take 1 capsule (20 mg total) by mouth daily., Disp: 30 capsule, Rfl: 5 .  pravastatin (PRAVACHOL) 40 MG tablet, Take 0.5 tablets (20 mg total) by mouth daily., Disp: 45  tablet, Rfl: 3 .  predniSONE (DELTASONE) 5 MG tablet, Take 5 mg by mouth daily with breakfast., Disp: , Rfl:  .  Probiotic Product (PROBIOTIC DAILY PO), Take by mouth. Take 2 gummies daily, Disp: , Rfl:  .  dicyclomine (BENTYL) 10 MG capsule, Take 10 mg by mouth 2 (two) times daily as needed for spasms., Disp: , Rfl:  .  mometasone (ASMANEX) 220 MCG/INH inhaler, Inhale 2 puffs into the lungs as needed., Disp: , Rfl:  .  PROAIR HFA 108 (90 BASE) MCG/ACT inhaler, Inhale 2 puffs into the lungs every 6 (six) hours as needed., Disp: , Rfl: 6     Objective:   Physical Exam  Constitutional: She is oriented to person, place, and time. She appears well-developed and well-nourished. No distress.  .Body mass index is 29.49 kg/(m^2).   HENT:  Head: Normocephalic and atraumatic.  Right Ear: External ear normal.  Left Ear: External ear normal.  Mouth/Throat: Oropharynx is clear and moist. No oropharyngeal exudate.  Eyes: Conjunctivae and EOM are normal. Pupils are equal, round, and reactive to light. Right eye exhibits no discharge. Left eye exhibits no discharge. No scleral icterus.  Neck: Normal range of motion. Neck supple. No JVD present. No tracheal deviation present. No thyromegaly present.  Cardiovascular: Normal rate, regular rhythm, normal heart sounds and intact distal pulses.  Exam reveals no gallop and no friction rub.   No murmur heard. Pulmonary/Chest: Effort normal. No respiratory distress. She has no wheezes. She has rales. She exhibits no tenderness.  Rt side no shingles. Only scar fom remote lung bx. Bibasal crackles +  Abdominal: Soft. Bowel sounds are normal. She exhibits no distension and no mass. There is no tenderness. There is no rebound and no guarding.  Musculoskeletal: Normal range of motion. She  exhibits no edema or tenderness.  Lymphadenopathy:    She has no cervical adenopathy.  Neurological: She is alert and oriented to person, place, and time. She has normal reflexes. No cranial nerve deficit. She exhibits normal muscle tone. Coordination normal.  Skin: Skin is warm and dry. No rash noted. She is not diaphoretic. No erythema. No pallor.  Psychiatric: She has a normal mood and affect. Her behavior is normal. Judgment and thought content normal.  Vitals reviewed.   Filed Vitals:   12/19/14 0909  BP: 120/64  Pulse: 72  Height: 5\' 1"  (1.549 m)  Weight: 156 lb (70.761 kg)  SpO2: 96%         Assessment & Plan:     ICD-9-CM ICD-10-CM   1. ILD (interstitial lung disease) 515 J84.9 Pulmonary Function Test     CT Chest High Resolution     AMB referral to Rehabilitation  2. Hypersensitivity pneumonitis 495.9 J67.9 Pulmonary Function Test     CT Chest High Resolution     AMB referral to Rehabilitation  3. Right-sided chest wall pain 786.52 R07.89   4. Chronic cough 786.2 R05 CT Chest High Resolution    #ILD due to HP  - appears stable objectively  but subjectivel you seem more short of breath  - continue prednisone 5mg  per day  - refer pulmonary rehab; should give US objective data and help improve fitness for future travel - handicap sticker for car - do HRCT chest in 3 months (last one was sept 2014) - Do PFT in 3 months  #Right chest wall pain  - not sure why; could be due to ILD   #Chronic cough  - likely  Due to  residual post viral reactive cough from fall 2015, post nasal drainage, ILD And Cough neuropathy  - continue cetrizine  - START generic fluticasone inhaler 2 squirts each nostril daily - at all times when you have urge to clear throat  - drink water or suck on sugarless lozenge - if persists, then will start gabapentin  #Followup  - full PFT 3 months  - HRCT 3 months  - return to see me  In 3 months - call or come sooner if cough or chest pain  continue to be problems    > 50% of this > 25 min visit spent in face to face counseling or coordination of care (15 min visit converted to 25 min)   Dr. Brand Males, M.D., [facility].C.P Pulmonary and Critical Care Medicine Staff Physician [facility] Pager: [phone], If no answer or between  15:00h - 7:00h: call [phone]  12/20/2014 5:15 AM

## 2015-01-04 ENCOUNTER — Other Ambulatory Visit: Payer: Self-pay | Admitting: Physician Assistant

## 2015-01-17 ENCOUNTER — Ambulatory Visit: Payer: Medicare Other | Admitting: Family Medicine

## 2015-01-17 ENCOUNTER — Ambulatory Visit: Payer: BC Managed Care – PPO | Admitting: Family Medicine

## 2015-01-22 ENCOUNTER — Ambulatory Visit (INDEPENDENT_AMBULATORY_CARE_PROVIDER_SITE_OTHER): Payer: Medicare Other | Admitting: Family Medicine

## 2015-01-22 ENCOUNTER — Encounter: Payer: Self-pay | Admitting: Family Medicine

## 2015-01-22 VITALS — BP 100/60 | HR 77 | Temp 98.1°F | Resp 16 | Ht 60.0 in | Wt 155.2 lb

## 2015-01-22 DIAGNOSIS — R002 Palpitations: Secondary | ICD-10-CM

## 2015-01-22 DIAGNOSIS — R7302 Impaired glucose tolerance (oral): Secondary | ICD-10-CM

## 2015-01-22 DIAGNOSIS — I080 Rheumatic disorders of both mitral and aortic valves: Secondary | ICD-10-CM

## 2015-01-22 LAB — POCT GLYCOSYLATED HEMOGLOBIN (HGB A1C): Hemoglobin A1C: 5.9

## 2015-01-22 MED ORDER — METOPROLOL SUCCINATE ER 50 MG PO TB24: ORAL_TABLET | ORAL | Status: DC

## 2015-01-22 NOTE — Progress Notes (Signed)
Subjective:    Patient ID: Rhonda [name], female    DOB: Feb 15, 1951, 64 y.o.   MRN: [id number]  HPI This 64 y.o. Female has Interstitial Lung Disease, followed by Dr. Montez Morita. Pulmonary rehab has been ordered but pt has not started this program; She also has hx of palpitations w/ heart rate > 100 for > 10 years. Today she reports irregular heart rate and pounding sensation, onset ~ 3 weeks ago. Heart rate goes from 60 to 100, without SOB, nausea or tightness, numbness or weakness. She has chronic PND with cough. Dr. Melvyn Novas changed medication from Xyzal to generic Zyrtec in addition to steroid nasal sprays. Pt checked with the pharmacy to see if she had a different lot of medication; she has a 90-day supply so her medication has not changed. She tried stopping some other medications (allergy meds) to see it it reduced palpitations; it does not. She limits caffeine because of sensitivity to this substance.  Patient Active Problem List   Diagnosis Date Noted  . Right-sided chest wall pain 12/19/2014  . Chronic cough 12/19/2014  . Wheezing 10/20/2014  . ILD (interstitial lung disease) 09/20/2014  . Upper respiratory infection 08/25/2014  . Irritable bowel syndrome (IBS) 02/08/2014  . Spontaneous pneumothorax- Right 08/08/2013  . High risk medication use 01/03/2013  . Osteopenia 10/28/2012  . Metabolic syndrome 28/76/8115  . 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 oil pain and ? mold in house 10/29/2011  . Snoring 10/29/2011  . HYPERLIPIDEMIA-MIXED 04/02/2010  . ABNORMAL EKG 04/02/2010  . DEPRESSION 03/27/2010  . MITRAL REGURGITATION 03/27/2010  . DYSPNEA ON EXERTION 03/27/2010    Prior to Admission medications   Medication Sig Start Date End Date Taking? Authorizing Provider  alendronate (FOSAMAX) 70 MG tablet Take 1 tablet (70 mg total) by mouth every 7 (seven) days. Take with a full glass of water on an empty stomach. 10/12/14  Yes Barton Fanny, MD    Alpha-D-Galactosidase (BEANO PO) Take by mouth as needed.   Yes Historical Provider, MD  Ascorbic Acid (VITAMIN C) 1000 MG tablet Take 1,000 mg by mouth daily.   Yes Historical Provider, MD  calcium carbonate (TUMS - DOSED IN MG ELEMENTAL CALCIUM) 500 MG chewable tablet Chew 2 tablets by mouth daily.   Yes Historical Provider, MD  cetirizine (ZYRTEC) 10 MG tablet Take 10 mg by mouth daily.   Yes Historical Provider, MD  fluticasone (FLONASE) 50 MCG/ACT nasal spray Place 2 sprays into both nostrils daily.   Yes Historical Provider, MD  lactase (LACTAID) 3000 UNITS tablet Take by mouth as needed.   Yes Historical Provider, MD  metoprolol succinate (TOPROL-XL) 50 MG 24 hr tablet TAKE 1 TABLET EVERY DAY IMMEDIATELY FOLLOWING A MEAL.   Yes Barton Fanny, MD  mometasone (NASONEX) 50 MCG/ACT nasal spray Place 2 sprays into the nose daily.   Yes Historical Provider, MD  Multiple Vitamins-Minerals (AIRBORNE PO) Take by mouth as needed.   Yes Historical Provider, MD  omeprazole (PRILOSEC) 20 MG capsule Take 1 capsule (20 mg total) by mouth daily. 03/09/13  Yes Barton Fanny, MD  pravastatin (PRAVACHOL) 40 MG tablet Take 0.5 tablets (20 mg total) by mouth daily. 10/12/14  Yes Barton Fanny, MD  predniSONE (DELTASONE) 5 MG tablet Take 5 mg by mouth daily with breakfast.   Yes Historical Provider, MD  Probiotic Product (PROBIOTIC DAILY PO) Take by mouth. Take 2 gummies daily   Yes Historical Provider, MD  Alum Hydroxide-Mag Carbonate (GAVISCON PO)  Take by mouth as needed.    Historical Provider, MD  mometasone [facility]) 220 MCG/INH inhaler Inhale 2 puffs into the lungs as needed.    Historical Provider, MD   Past Surgical History  Procedure Laterality Date  . Tubal ligation    . Foot fracture surgery  2006 or 2007    right  . Video bronchoscopy  11/19/2011    Procedure: VIDEO BRONCHOSCOPY WITH FLUORO;  Surgeon: Brand Males, MD;  Location: [facility] ENDOSCOPY;  Service: Endoscopy;;  .  Hymenectomy    . Cesarean section    . Video assisted thoracoscopy  09/28/2012    Procedure: VIDEO ASSISTED THORACOSCOPY;  Surgeon: Melrose Nakayama, MD;  Location: [place];  Service: Thoracic;  Laterality: Right;  . Lung biopsy  09/28/2012    Procedure: LUNG BIOPSY;  Surgeon: Melrose Nakayama, MD;  Location: [place];  Service: Thoracic;  Laterality: N/A;  lung biopsies tims three  . Breast biopsy Right 2009    BIOPSY    SOC and FAM HX reviewed.   Review of Systems  Constitutional:       Has gained 5 lbs over the winter months.  Eyes: Negative.   Musculoskeletal: Negative.   Skin: Negative.   Neurological: Negative.   Psychiatric/Behavioral: Negative.    As per HPI.     Objective:   Physical Exam  Constitutional: She is oriented to person, place, and time. She appears well-developed and well-nourished. No distress.  HENT:  Head: Normocephalic and atraumatic.  Right Ear: External ear normal.  Left Ear: External ear normal.  Nose: Nose normal.  Mouth/Throat: Oropharynx is clear and moist.  Eyes: EOM are normal. Pupils are equal, round, and reactive to light. No scleral icterus.  Neck: Normal range of motion. Neck supple. No thyromegaly present.  Cardiovascular: Normal rate, regular rhythm and normal heart sounds.  Exam reveals no gallop.   No murmur heard. Pulmonary/Chest: Effort normal and breath sounds normal. No respiratory distress. She has no wheezes.  Musculoskeletal: Normal range of motion. She exhibits no edema.  Lymphadenopathy:    She has no cervical adenopathy.  Neurological: She is alert and oriented to person, place, and time. No cranial nerve deficit. Coordination normal.  Skin: Skin is warm and dry. She is not diaphoretic. No pallor.  Nursing note and vitals reviewed.   ECG: Normal sinus rhythm w/ nonspecific T wave changes. No ectopy.  Results for orders placed or performed in visit on 01/22/15  POCT glycosylated hemoglobin (Hb A1C)  Result Value Ref  Range   Hemoglobin A1C 5.9        Assessment & Plan:  Palpitations - Continue metoprolol for now; refer to Cardiology for 24-48 hour Holter and other studies as warranted. Plan: EKG 12-Lead, POCT glycosylated hemoglobin (Hb A1C), Ambulatory referral to Cardiology  Impaired glucose tolerance - Advised Mediterranean Diet (pt familiar with this nutrition guideline). Plan: POCT glycosylated hemoglobin (Hb A1C)  MITRAL REGURGITATION - Pt has history of this disorder with normal Nuclear Stress Cardiolite Study in Dec 2003. Plan: Ambulatory referral to Cardiology

## 2015-01-22 NOTE — Patient Instructions (Addendum)
Once you turn 2, Medicare will cover a "Welcome to Medicare" visit and exam within the first year. This visit is for review of preventative procedures and lab screenings covered by Medicare. After the first year, you are entitled to a once-a-year Subsequent Medicare Wellness visit.  You can schedule to return in early April before your birthday for the "Welcome to Medicare" visit.  Your A1c shows impaired glucose metabolism, not much different from last year.  Below is some information about MEDITERRANEAN DIET to help improve your nutrition. Continue drinking at least 40 ounces of water daily. Try to increase your physical activity; once you have seen the Cardiologist and you are cleared for Pulmonary Rehab, get started with that program.  Continue taking metoprolol for now.

## 2015-01-31 ENCOUNTER — Telehealth (HOSPITAL_COMMUNITY): Payer: Self-pay

## 2015-01-31 NOTE — Telephone Encounter (Signed)
I have called and left a message with Marilouise to inquire about participation in Pulmonary Rehab. Will follow up.

## 2015-02-01 ENCOUNTER — Telehealth (HOSPITAL_COMMUNITY): Payer: Self-pay

## 2015-02-01 NOTE — Telephone Encounter (Signed)
Called patient regarding entrance to Pulmonary Rehab.  Patient states that they are interested in attending the program.  Rhonda [name] is going to verify insurance coverage and follow up.

## 2015-02-11 ENCOUNTER — Inpatient Hospital Stay (HOSPITAL_COMMUNITY): Admission: RE | Admit: 2015-02-11 | Payer: BC Managed Care – PPO | Source: Ambulatory Visit

## 2015-02-22 ENCOUNTER — Ambulatory Visit (INDEPENDENT_AMBULATORY_CARE_PROVIDER_SITE_OTHER)
Admission: RE | Admit: 2015-02-22 | Discharge: 2015-02-22 | Disposition: A | Discharge status: Home / Self Care | Payer: Medicare Other | Source: Ambulatory Visit | Attending: Internal Medicine | Admitting: Internal Medicine

## 2015-02-22 ENCOUNTER — Telehealth: Payer: Self-pay | Admitting: Internal Medicine

## 2015-02-22 DIAGNOSIS — J679 Hypersensitivity pneumonitis due to unspecified organic dust: Secondary | ICD-10-CM

## 2015-02-22 DIAGNOSIS — R053 Chronic cough: Secondary | ICD-10-CM

## 2015-02-22 DIAGNOSIS — J849 Interstitial pulmonary disease, unspecified: Secondary | ICD-10-CM | POA: Diagnosis not present

## 2015-02-22 DIAGNOSIS — R05 Cough: Secondary | ICD-10-CM | POA: Diagnosis not present

## 2015-02-22 NOTE — Telephone Encounter (Signed)
Nothing further needed 

## 2015-02-25 ENCOUNTER — Telehealth: Payer: Self-pay | Admitting: Internal Medicine

## 2015-02-25 NOTE — Telephone Encounter (Signed)
Called and spoke to pt. Pt needing pft before appt with MR on 4/5 at 11:45. PFT appt made at [facility] at [place]. Pt verbalized understanding and denied any further questions or concerns at this time.

## 2015-02-26 ENCOUNTER — Ambulatory Visit (INDEPENDENT_AMBULATORY_CARE_PROVIDER_SITE_OTHER): Payer: Medicare Other | Admitting: Family Medicine

## 2015-02-26 ENCOUNTER — Ambulatory Visit (INDEPENDENT_AMBULATORY_CARE_PROVIDER_SITE_OTHER): Payer: Medicare Other | Admitting: Internal Medicine

## 2015-02-26 ENCOUNTER — Encounter: Payer: Self-pay | Admitting: Internal Medicine

## 2015-02-26 ENCOUNTER — Ambulatory Visit (HOSPITAL_COMMUNITY)
Admission: RE | Admit: 2015-02-26 | Discharge: 2015-02-26 | Disposition: A | Discharge status: Home / Self Care | Payer: Medicare Other | Source: Ambulatory Visit | Attending: Internal Medicine | Admitting: Internal Medicine

## 2015-02-26 ENCOUNTER — Encounter: Payer: Self-pay | Admitting: Family Medicine

## 2015-02-26 ENCOUNTER — Encounter (HOSPITAL_COMMUNITY): Payer: BC Managed Care – PPO

## 2015-02-26 VITALS — BP 123/66 | HR 74 | Temp 97.9°F | Resp 16 | Ht 60.5 in | Wt 152.0 lb

## 2015-02-26 VITALS — BP 108/60 | HR 69 | Ht 60.0 in | Wt 153.0 lb

## 2015-02-26 DIAGNOSIS — R053 Chronic cough: Secondary | ICD-10-CM

## 2015-02-26 DIAGNOSIS — R0609 Other forms of dyspnea: Secondary | ICD-10-CM | POA: Insufficient documentation

## 2015-02-26 DIAGNOSIS — J849 Interstitial pulmonary disease, unspecified: Secondary | ICD-10-CM

## 2015-02-26 DIAGNOSIS — Z0000 Encounter for general adult medical examination without abnormal findings: Secondary | ICD-10-CM

## 2015-02-26 DIAGNOSIS — J679 Hypersensitivity pneumonitis due to unspecified organic dust: Secondary | ICD-10-CM | POA: Diagnosis not present

## 2015-02-26 DIAGNOSIS — Z124 Encounter for screening for malignant neoplasm of cervix: Secondary | ICD-10-CM | POA: Diagnosis not present

## 2015-02-26 DIAGNOSIS — E782 Mixed hyperlipidemia: Secondary | ICD-10-CM | POA: Diagnosis not present

## 2015-02-26 DIAGNOSIS — Z01419 Encounter for gynecological examination (general) (routine) without abnormal findings: Secondary | ICD-10-CM

## 2015-02-26 DIAGNOSIS — R05 Cough: Secondary | ICD-10-CM | POA: Diagnosis not present

## 2015-02-26 LAB — PULMONARY FUNCTION TEST
DL/VA % PRED: 84 %
DL/VA: 3.46 ml/min/mmHg/L
DLCO UNC: 5.52 ml/min/mmHg
DLCO unc % pred: 31 %
FEF 25-75 Post: 2 L/sec
FEF 25-75 Pre: 1.72 L/sec
FEF2575-%Change-Post: 16 %
FEF2575-%Pred-Post: 110 %
FEF2575-%Pred-Pre: 95 %
FEV1-%CHANGE-POST: 1 %
FEV1-%Pred-Post: 80 %
FEV1-%Pred-Pre: 78 %
FEV1-Post: 1.55 L
FEV1-Pre: 1.52 L
FEV1FVC-%Change-Post: 1 %
FEV1FVC-%Pred-Pre: 107 %
FEV6-%Change-Post: 0 %
FEV6-%PRED-POST: 75 %
FEV6-%Pred-Pre: 75 %
FEV6-PRE: 1.84 L
FEV6-Post: 1.84 L
FEV6FVC-%PRED-PRE: 104 %
FEV6FVC-%Pred-Post: 104 %
FVC-%Change-Post: 0 %
FVC-%PRED-PRE: 72 %
FVC-%Pred-Post: 72 %
FVC-POST: 1.84 L
FVC-PRE: 1.84 L
POST FEV1/FVC RATIO: 84 %
POST FEV6/FVC RATIO: 100 %
Pre FEV1/FVC ratio: 83 %
Pre FEV6/FVC Ratio: 100 %
RV % pred: 97 %
RV: 1.82 L
TLC % PRED: 87 %
TLC: 3.78 L

## 2015-02-26 LAB — COMPLETE METABOLIC PANEL WITH GFR
ALT: 21 U/L (ref 0–35)
AST: 23 U/L (ref 0–37)
Albumin: 3.9 g/dL (ref 3.5–5.2)
Alkaline Phosphatase: 43 U/L (ref 39–117)
BUN: 22 mg/dL (ref 6–23)
CALCIUM: 9.5 mg/dL (ref 8.4–10.5)
CHLORIDE: 103 meq/L (ref 96–112)
CO2: 28 mEq/L (ref 19–32)
Creat: 0.91 mg/dL (ref 0.50–1.10)
GFR, EST NON AFRICAN AMERICAN: 66 mL/min
GFR, Est African American: 77 mL/min
Glucose, Bld: 95 mg/dL (ref 70–99)
Potassium: 4.8 mEq/L (ref 3.5–5.3)
Sodium: 141 mEq/L (ref 135–145)
TOTAL PROTEIN: 6.8 g/dL (ref 6.0–8.3)
Total Bilirubin: 0.5 mg/dL (ref 0.2–1.2)

## 2015-02-26 LAB — LIPID PANEL
CHOL/HDL RATIO: 3.5 ratio
Cholesterol: 184 mg/dL (ref 0–200)
HDL: 53 mg/dL (ref >=46)
LDL Cholesterol: 94 mg/dL (ref 0–99)
Triglycerides: 185 mg/dL — ABNORMAL HIGH (ref <150)
VLDL: 37 mg/dL (ref 0–40)

## 2015-02-26 MED ORDER — PRAVASTATIN SODIUM 40 MG PO TABS: 20.0000 mg | ORAL_TABLET | Freq: Every day | ORAL | Status: DC

## 2015-02-26 MED ORDER — METOPROLOL SUCCINATE ER 50 MG PO TB24: ORAL_TABLET | ORAL | Status: DC

## 2015-02-26 MED ORDER — CETIRIZINE HCL 10 MG PO TABS: 10.0000 mg | ORAL_TABLET | Freq: Every day | ORAL | Status: DC

## 2015-02-26 MED ORDER — ALBUTEROL SULFATE (2.5 MG/3ML) 0.083% IN NEBU
2.5000 mg | INHALATION_SOLUTION | Freq: Once | RESPIRATORY_TRACT | Status: AC
  Administered 2015-02-26: 2.5 mg via RESPIRATORY_TRACT

## 2015-02-26 MED ORDER — ALENDRONATE SODIUM 70 MG PO TABS: 70.0000 mg | ORAL_TABLET | ORAL | Status: DC

## 2015-02-26 NOTE — Patient Instructions (Addendum)
ICD-9-CM ICD-10-CM   1. ILD (interstitial lung disease) 515 J84.9   2. Hypersensitivity pneumonitis 495.9 J67.9   3. 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  - appears stable objectively (low dlco on PFT an artifact)  - continue prednisone 5mg  per day  - start rehab - ok for [place]  #Chronic cough  - likely  Due to residual post viral reactive cough from fall 2015, post nasal drainage, ILD And Cough neuropathy - glad it resolved last month - but now recurred due to allergies and pollent  - continue cetrizine and chlorpheniramine as needed  - continue generic fluticasone inhaler 2 squirts each nostril daily once or twice - start netti pot - at all times when you have urge to clear throat  - drink water or suck on sugarless lozenge - if persists, then will start gabapentin  #Followup  -august 2016  - walk test and office spirometry at followup - call or come sooner if cough or chest pain continue to be problems

## 2015-02-26 NOTE — Progress Notes (Signed)
Subjective:    Patient ID: Rhonda [name], female    DOB: [birth year], 64 y.o.   MRN: [id number]  HPI  This 64 y.o. Female is here for WELCOME to MCR/ CPE. She has ILD, followed by Dr. Chase Caller. GERD symptoms have declined significantly; she takes 1/2 PPI tablet daily. Dietary changes (eliminating onions and garlic) has reduced GERD and IBS symptoms. Pt takes alendronate due to osteopenia, attributed to chronic Prednisone use. She takes a statin for lipid disorder; pravastatin in well tolerated w/o adverse effects. Intermittent palpitations will be evaluated tomorrow at Dr. Dorris Carnes' cardiology practice.  HCM: MMG- Current.           PAP- 2011 (negative).           CRS- 2014 9normal w/ 10-year recall).           IMM- Current.           DEXA- 2014.   Patient Active Problem List   Diagnosis Date Noted  . Right-sided chest wall pain 12/19/2014  . Chronic cough 12/19/2014  . Wheezing 10/20/2014  . ILD (interstitial lung disease) 09/20/2014  . Upper respiratory infection 08/25/2014  . Irritable bowel syndrome (IBS) 02/08/2014  . Spontaneous pneumothorax- Right 08/08/2013  . High risk medication use 01/03/2013  . Osteopenia 10/28/2012  . Metabolic syndrome 66/04/3015  . 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 oil pain and ? mold in house 10/29/2011  . Snoring 10/29/2011  . HYPERLIPIDEMIA-MIXED 04/02/2010  . ABNORMAL EKG 04/02/2010  . DEPRESSION 03/27/2010  . MITRAL REGURGITATION 03/27/2010  . DYSPNEA ON EXERTION 03/27/2010    Prior to Admission medications   Medication Sig Start Date End Date Taking? Authorizing Provider  alendronate (FOSAMAX) 70 MG tablet Take 1 tablet (70 mg total) by mouth every 7 (seven) days. Take with a full glass of water on an empty stomach. 10/12/14  Yes Barton Fanny, MD  Alpha-D-Galactosidase (BEANO PO) Take by mouth as needed.   Yes Historical Provider, MD  Alum Hydroxide-Mag Carbonate (GAVISCON PO) Take by mouth as needed.   Yes  Historical Provider, MD  Ascorbic Acid (VITAMIN C) 1000 MG tablet Take 1,000 mg by mouth daily.   Yes Historical Provider, MD  calcium carbonate (TUMS - DOSED IN MG ELEMENTAL CALCIUM) 500 MG chewable tablet Chew 2 tablets by mouth daily.   Yes Historical Provider, MD  cetirizine (ZYRTEC) 10 MG tablet Take 10 mg by mouth daily.   Yes Historical Provider, MD  chlorpheniramine (CHLOR-TRIMETON) 4 MG tablet Take 4 mg by mouth 2 (two) times daily as needed for allergies.   Yes Historical Provider, MD  fluticasone (FLONASE) 50 MCG/ACT nasal spray Place 2 sprays into both nostrils daily.   Yes Historical Provider, MD  lactase (LACTAID) 3000 UNITS tablet Take by mouth as needed.   Yes Historical Provider, MD  metoprolol succinate (TOPROL-XL) 50 MG 24 hr tablet TAKE 1 TABLET EVERY DAY IMMEDIATELY FOLLOWING A MEAL. 01/22/15  Yes Barton Fanny, MD  Multiple Vitamins-Minerals (AIRBORNE PO) Take by mouth as needed.   Yes Historical Provider, MD  omeprazole (PRILOSEC) 20 MG capsule Take 1 capsule (20 mg total) by mouth daily. 03/09/13  Yes Barton Fanny, MD  pravastatin (PRAVACHOL) 40 MG tablet Take 0.5 tablets (20 mg total) by mouth daily. 10/12/14  Yes Barton Fanny, MD  predniSONE (DELTASONE) 5 MG tablet Take 5 mg by mouth daily with breakfast.   Yes Historical Provider, MD  Probiotic Product (PROBIOTIC DAILY PO) Take by  mouth. Take 2 gummies daily   Yes Historical Provider, MD    Past Surgical History  Procedure Laterality Date  . Tubal ligation    . Foot fracture surgery  2006 or 2007    right  . Video bronchoscopy  11/19/2011    Procedure: VIDEO BRONCHOSCOPY WITH FLUORO;  Surgeon: Brand Males, MD;  Location: [facility] ENDOSCOPY;  Service: Endoscopy;;  . Hymenectomy    . Cesarean section    . Video assisted thoracoscopy  09/28/2012    Procedure: VIDEO ASSISTED THORACOSCOPY;  Surgeon: Melrose Nakayama, MD;  Location: [place];  Service: Thoracic;  Laterality: Right;  . Lung biopsy   09/28/2012    Procedure: LUNG BIOPSY;  Surgeon: Melrose Nakayama, MD;  Location: [place];  Service: Thoracic;  Laterality: N/A;  lung biopsies tims three  . Breast biopsy Right 2009    BIOPSY    History   Social History  . Marital Status: Married    Spouse Name: Haystack  . Number of Children: 1  . Years of Education: N/A   Occupational History  . SELF EMPLOYED     speech patholoigists  .     Social History Main Topics  . Smoking status: Never Smoker   . Smokeless tobacco: Never Used     Comment: pt states she experimented in college  . Alcohol Use: 1.1 oz/week    1 Glasses of wine, 1 Standard drinks or equivalent per week     Comment: once a wk. 1-2 glasses  . Drug Use: No  . Sexual Activity: No     Comment: sex partners in the last 12 months 0   Other Topics Concern  . Not on file   Social History Narrative   Married; Electrical engineer      Exercise - swimming daily for 20-30 minutes   Patient was started her periods at 64 year old (regular periods), and painful periods          Family History  Problem Relation Age of Onset  . Emphysema Maternal Grandmother   . Asthma Maternal Grandmother   . Asthma Mother   . Osteoarthritis Mother   . Dementia Mother 90  . Lymphoma Father   . Diabetes Father   . Hypertension Sister   . Heart disease Sister   . Kidney disease Sister   . Lung disease Maternal Grandfather   . Breast cancer Paternal Aunt   . Ovarian cancer Maternal Aunt   . Bone cancer Paternal Grandfather   . Colon cancer Neg Hx     Review of Systems  Constitutional: Negative.   HENT: Positive for postnasal drip.   Eyes: Negative.   Respiratory: Positive for cough.   Cardiovascular: Positive for palpitations. Negative for chest pain.  Gastrointestinal: Negative.   Endocrine: Negative.   Musculoskeletal: Negative.   Skin: Negative.   Allergic/Immunologic: Positive for environmental allergies.  Neurological: Negative.   Hematological: Negative.     Psychiatric/Behavioral: Negative.        Objective:   Physical Exam  Constitutional: She is oriented to person, place, and time. Vital signs are normal. She appears well-developed and well-nourished. No distress.  Blood pressure 123/66, pulse 74, temperature 97.9 F (36.6 C), resp. rate 16, height 5' 0.5" (1.537 m), weight 152 lb (68.947 kg), SpO2 95 %.   HENT:  Head: Normocephalic and atraumatic.  Right Ear: Hearing, tympanic membrane, external ear and ear canal normal.  Left Ear: Hearing, tympanic membrane, external ear and ear canal normal.  Nose: Mucosal edema present. No nasal deformity or septal deviation.  Mouth/Throat: Uvula is midline, oropharynx is clear and moist and mucous membranes are normal. No oral lesions. Normal dentition.  Eyes: Conjunctivae, EOM and lids are normal. Pupils are equal, round, and reactive to light. No scleral icterus.  Wears corrective lenses.  Neck: Trachea normal, normal range of motion and phonation normal. Neck supple. No spinous process tenderness and no muscular tenderness present. Carotid bruit is not present. No thyroid mass and no thyromegaly present.  Cardiovascular: Normal rate, regular rhythm, S1 normal, S2 normal, normal heart sounds, intact distal pulses and normal pulses.   No extrasystoles are present. PMI is not displaced.  Exam reveals no gallop and no friction rub.   No murmur heard. Pulmonary/Chest: Effort normal and breath sounds normal. No accessory muscle usage. No respiratory distress. She has no decreased breath sounds. She has no wheezes. She has no rhonchi.  Abdominal: Soft. Normal appearance and bowel sounds are normal. She exhibits no distension, no abdominal bruit, no pulsatile midline mass and no mass. There is no hepatosplenomegaly. There is no tenderness. There is no guarding and no CVA tenderness.  Genitourinary: Rectum normal and uterus normal. There is no rash, tenderness or lesion on the right labia. There is no rash,  tenderness or lesion on the left labia. Cervix exhibits no motion tenderness. Right adnexum displays no mass, no tenderness and no fullness. Left adnexum displays no mass, no tenderness and no fullness. There is erythema and tenderness in the vagina. No signs of injury around the vagina. No vaginal discharge found.  Musculoskeletal:       Cervical back: Normal.       Thoracic back: Normal.       Lumbar back: Normal.  Remainder of exam unremarkable.  Lymphadenopathy:       Head (right side): No submental, no submandibular, no tonsillar, no preauricular, no posterior auricular and no occipital adenopathy present.       Head (left side): No submental, no submandibular, no tonsillar, no preauricular, no posterior auricular and no occipital adenopathy present.    She has no cervical adenopathy.       Right: No inguinal and no supraclavicular adenopathy present.       Left: No inguinal and no supraclavicular adenopathy present.  Neurological: She is alert and oriented to person, place, and time. She has normal strength and normal reflexes. She displays no atrophy. No cranial nerve deficit or sensory deficit. She exhibits normal muscle tone. She displays a negative Romberg sign. Coordination and gait normal.  GET UP and GO test: time= 6 seconds.  Skin: Skin is warm, dry and intact. No ecchymosis, no lesion and no rash noted. She is not diaphoretic. No cyanosis or erythema. No pallor. Nails show no clubbing.  Psychiatric: She has a normal mood and affect. Her speech is normal and behavior is normal. Judgment and thought content normal. Cognition and memory are normal.  Nursing note and vitals reviewed.      Assessment & Plan:  Medicare welcome visit  Medicare annual wellness visit, initial - Plan: Pap IG (Image Guided)  Encounter for cervical Pap smear with pelvic exam - Plan: Pap IG (Image Guided)  Hyperlipidemia, mixed - Plan: Lipid panel, COMPLETE METABOLIC PANEL WITH GFR   Meds ordered this  encounter  Medications  . pravastatin (PRAVACHOL) 40 MG tablet    Sig: Take 0.5 tablets (20 mg total) by mouth daily.    Dispense:  45 tablet  Refill:  3  . alendronate (FOSAMAX) 70 MG tablet    Sig: Take 1 tablet (70 mg total) by mouth every 7 (seven) days. Take with a full glass of water on an empty stomach.    Dispense:  4 tablet    Refill:  5  . metoprolol succinate (TOPROL-XL) 50 MG 24 hr tablet    Sig: TAKE 1 TABLET EVERY DAY IMMEDIATELY FOLLOWING A MEAL.    Dispense:  30 tablet    Refill:  5  . cetirizine (ZYRTEC) 10 MG tablet    Sig: Take 1 tablet (10 mg total) by mouth daily.    Dispense:  90 tablet    Refill:  3    ADVANCED DIRECTIVES- pt states she and her husband have all those legal matters in place.

## 2015-02-26 NOTE — Patient Instructions (Signed)

## 2015-02-26 NOTE — Progress Notes (Signed)
Subjective:    Patient ID: Rhonda [name], female    DOB: 1951-02-03, 64 y.o.   MRN: [id number]  HPI      #GE reflux with small hiatal hernia  - on ppi  #MEtabolic Syndrome  - On low glycemic diet  159#  on 09/19/12 147# on 11/21/12 145# on 01/02/13 145# on 02/28/2013 147# - on 05/25/2013 154# - 09/18/14   #History of rapid heart rate not otherwise specified  - Start her on Lopressor  2008/2009 by primary care physician. History appears to correlate with onset of respiratory issues  - refuses to dc this drug as of 2014 discussion   # Hypersensitivity Pneumonitis and ILD  - Potential etiologies - cockateil x 2 for 18 years through end 2012. 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 Denies mold but lives in house [place] in [place] with a "weird baselment: and has humidifier  - first noted on CT chest 10/15/09 following trip to [place] (PE negative) but not described in 2003 CT chest report  - autoimmune panel: 10/23/11: Negative  -  Uderwennt bronch 11/19/11  - non diagnostic  - VATS Nov 2013 (done after initially refusing)- [place]. However, there is worrying trend of UIP pattern in the Upper lobes.            Oct/Nov 2012  March 2013 08/11/12  Nov/Dec 2013 Dx HP/UIP 01/02/2013  02/28/13 05/25/2013  12/06/2013  03/15/2014  06/29/2014  10'27/15 12/19/2014  02/26/15  Symp/Signs Dyspnea x5y  New crackles  dimished dyspnea except at hill. Improved crackles.  Improved dyspnea except @ hill         Fev1  1.9 L   1.7L/90%  1.66 L/87%   1.66 L/78%  1.75L/90% 1.68L/82% 1.56/75 1.6L/78% 1.48/75% 1.58L/77%  1.52L/78%  FVC  2.6 L    2.2 L/82%   2.1 L/80%   2.19 L/82%  2.2L/91% 2.1/81% 1.86/69% 1.9L/71% 1.86/75% 1.75L/65% 1.82/73% (dec 2015 offce spiro) 2.84L/72%  Ratio     79   76/95%  80/99% 80/100%  84  90    fef 25-75%               TLC  4.3 L    3.3 L/79%  3.5 L/82%      3.22/72% 3.06L/68%  3.06/68%  3.78/87%  DLCO  10/50%    10/51%   9.6/51%     11.9/63% 11.7/62%  11/561%  5.5/31%  Walk test 185 ft x 3 laps on RA  rest 92%. Pk exertion 90%. Pk HR 108 Rest 92%. Pk exertion 86% at 2.5 laps. Pk HR 102  pk exertion - 88% Rest 96%. Pk 95%. Pk HR is 75. Done at full dose lopressor Rest 94%. Pk 91%. Rest HR 93 with pk HR 103/min. Done at half dose lopressor REst 96%. Pk pulse ox 92%. Rest HR 65. Pk HR 86 Did not desat. Normal Pk HR  Pulse ox 96%, droped to89% at 2nd lap but bounced up and ended at 91% 3rd lap Did not desat Did not desat, Pk HHR 93   CT chest   unchanged            Tests/Bx Bronch 0- non diagnostic   VATS - HP with UIP in Upper lobe           RX    Rx pred start 11/23/12 On  pred 30mg  per day and will  reduce to 20mg  On Pred 20mg  per day On pred 10mg  per day On pred 5mg  per day Rx pred 5mg  daily She reduced to pred 5mg  x past 1 month QOD due to GI concern side effects On pred 5mg  qod but will increase to 5/d after this visit On pred 5mg  daily                    OV 12/19/2014  Chief Complaint  Patient presents with  . Follow-up    Pt stated she is much improved since being sick. Pt c/o DOE d/t cold weather, prod cough with white mucus, PND and epigastric pain.     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 Now cough has improved to near baseline.  Stil bothered by the residual cough which seems to have significant neurogenic component (cleras throat, gag etc., ). Dyspnea on exertion is class 2 and stable compated to last visit with stable spirometry and walking desat test. However, husband feels over time pst few years dyspnea is progressive. She is very afraid of even the mildes incline. Swimming poses problem for dyspnea and cough. Never been to rehab due to daily swimming. Now worried that dyspnea/cough can impact desire to travel abroad. Also, seems  to be having non specific msk tupe right chest wall pain for many months.   Past, Family, Social reviewed: no change since last visit   OV 02/26/2015  Chief Complaint  Patient presents with  . Follow-up    Pt here after PFT and HRCT. Pt stated her breathing is better but her allergies are bothersome. Pt c/o prod cough with white mucus, sinus congestion, rhinorrhea. Pt stated her cough is better when she lays down.     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 AT that time big issue was severe cough which was felt largely cough hyperesnitvity due to fall 2015 viral infection. She says that slowly resolved. Wit that dyspnea improved too. Last week at [place] she climbed stairs etc., better than ever before. However, past week pollen level high and having cough again though not as bad. She is takng nasal steroid, OTC antihistamine x 2 and his helping. Dyspnea itself is stble. Cntinues pred 5mg  per day. [facility]endedd transplant lecture [facility]. Planning [place] trip in summer 2016. PFTs documented above show stabilit in FVC but dlco is reduced (She recollects machine error)      has a past medical history of High cholesterol; History of chronic bronchitis; GERD (gastroesophageal reflux disease); Arthritis; Depression; Allergy; Asthma; Cataract; Pneumonitis, hypersensitivity; Rapid heart rate; DOE (dyspnea on exertion); Hyperplastic colon polyp (2007); Hiatal hernia; Interstitial lung disease; Insulin resistance; Inguinal hernia; Pulmonary fibrosis; and Helicobacter pylori ab+.   reports that she has never smoked. She has never used smokeless tobacco.  Past Surgical History  Procedure Laterality Date  . Tubal ligation    . Foot fracture surgery  2006 or 2007    right  . Video bronchoscopy  11/19/2011    Procedure: VIDEO BRONCHOSCOPY WITH FLUORO;  Surgeon: Brand Males, MD;  Location: [facility];  Service: Endoscopy;;  . Hymenectomy    . Cesarean section    . Video assisted  thoracoscopy  09/28/2012    Procedure: VIDEO ASSISTED THORACOSCOPY;  Surgeon: Melrose Nakayama, MD;  Location: [place];  Service: Thoracic;  Laterality: Right;  .  Lung biopsy  09/28/2012    Procedure: LUNG BIOPSY;  Surgeon: Melrose Nakayama, MD;  Location: [place];  Service: Thoracic;  Laterality: N/A;  lung biopsies tims three  . Breast biopsy Right 2009    BIOPSY    Allergies  Allergen Reactions  . Atorvastatin Other (See Comments)    Leg pain  . Betadine [Povidone Iodine] Other (See Comments)    blisters  . Codeine Nausea And Vomiting  . Rosuvastatin Other (See Comments)    Leg pain  . Shellfish Allergy Other (See Comments)    vomiting  . Sulfonamide Derivatives Other (See Comments)    headaches    Immunization History  Administered Date(s) Administered  . Influenza Split 08/11/2012  . Influenza Whole 09/08/2011  . Influenza,inj,Quad PF,36+ Mos 09/07/2013  . Influenza-Unspecified 09/26/2014  . Pneumococcal Conjugate-13 03/15/2014  . Pneumococcal Polysaccharide-23 03/15/2014  . Td 02/22/2003  . Tdap 10/28/2012  . Zoster 11/23/2010    Family History  Problem Relation Age of Onset  . Emphysema Maternal Grandmother   . Asthma Maternal Grandmother   . Asthma Mother   . Osteoarthritis Mother   . Dementia Mother 72  . Lymphoma Father   . Diabetes Father   . Hypertension Sister   . Heart disease Sister   . Kidney disease Sister   . Lung disease Maternal Grandfather   . Breast cancer Paternal Aunt   . Ovarian cancer Maternal Aunt   . Bone cancer Paternal Grandfather   . Colon cancer Neg Hx      Current outpatient prescriptions:  .  alendronate (FOSAMAX) 70 MG tablet, Take 1 tablet (70 mg total) by mouth every 7 (seven) days. Take with a full glass of water on an empty stomach., Disp: 4 tablet, Rfl: 5 .  Alpha-D-Galactosidase (BEANO PO), Take by mouth as needed., Disp: , Rfl:  .  Alum Hydroxide-Mag Carbonate (GAVISCON PO), Take by mouth as needed., Disp: , Rfl:    .  Ascorbic Acid (VITAMIN C) 1000 MG tablet, Take 1,000 mg by mouth daily., Disp: , Rfl:  .  calcium carbonate (TUMS - DOSED IN MG ELEMENTAL CALCIUM) 500 MG chewable tablet, Chew 2 tablets by mouth daily., Disp: , Rfl:  .  cetirizine (ZYRTEC) 10 MG tablet, Take 10 mg by mouth daily., Disp: , Rfl:  .  chlorpheniramine (CHLOR-TRIMETON) 4 MG tablet, Take 4 mg by mouth 2 (two) times daily as needed for allergies., Disp: , Rfl:  .  fluticasone (FLONASE) 50 MCG/ACT nasal spray, Place 2 sprays into both nostrils daily., Disp: , Rfl:  .  lactase (LACTAID) 3000 UNITS tablet, Take by mouth as needed., Disp: , Rfl:  .  metoprolol succinate (TOPROL-XL) 50 MG 24 hr tablet, TAKE 1 TABLET EVERY DAY IMMEDIATELY FOLLOWING A MEAL., Disp: 30 tablet, Rfl: 5 .  Multiple Vitamins-Minerals (AIRBORNE PO), Take by mouth as needed., Disp: , Rfl:  .  omeprazole (PRILOSEC) 20 MG capsule, Take 1 capsule (20 mg total) by mouth daily., Disp: 30 capsule, Rfl: 5 .  pravastatin (PRAVACHOL) 40 MG tablet, Take 0.5 tablets (20 mg total) by mouth daily., Disp: 45 tablet, Rfl: 3 .  predniSONE (DELTASONE) 5 MG tablet, Take 5 mg by mouth daily with breakfast., Disp: , Rfl:  .  Probiotic Product (PROBIOTIC DAILY PO), Take by mouth. Take 2 gummies daily, Disp: , Rfl:      Review of Systems  Constitutional: Negative for fever and unexpected weight change.  HENT: Positive for postnasal drip and rhinorrhea. Negative for  congestion, dental problem, ear pain, nosebleeds, sinus pressure, sneezing, sore throat and trouble swallowing.   Eyes: Negative for redness and itching.  Respiratory: Positive for cough. Negative for chest tightness, shortness of breath and wheezing.   Cardiovascular: Negative for palpitations and leg swelling.  Gastrointestinal: Negative for nausea and vomiting.  Genitourinary: Negative for dysuria.  Musculoskeletal: Negative for joint swelling.  Skin: Negative for rash.  Neurological: Negative for headaches.   Hematological: Does not bruise/bleed easily.  Psychiatric/Behavioral: Negative for dysphoric mood. The patient is not nervous/anxious.        Objective:   Physical Exam  Filed Vitals:   02/26/15 1204  BP: 108/60  Pulse: 69  Height: 5' (1.524 m)  Weight: 153 lb (69.4 kg)  SpO2: 98%    onstitutional: She is oriented to person, place, and time. She appears well-developed and well-nourished. No distress.  .Body mass index is 29.49 kg/(m^2). - jan 2016 Body mass index is 29.88 kg/(m^2). 02/26/2015    HENT:  Head: Normocephalic and atraumatic.  Right Ear: External ear normal.  Left Ear: External ear normal.  Mouth/Throat: Oropharynx is clear and moist. No oropharyngeal exudate.  Eyes: Conjunctivae and EOM are normal. Pupils are equal, round, and reactive to light. Right eye exhibits no discharge. Left eye exhibits no discharge. No scleral icterus.  Neck: Normal range of motion. Neck supple. No JVD present. No tracheal deviation present. No thyromegaly present.  Cardiovascular: Normal rate, regular rhythm, normal heart sounds and intact distal pulses.  Exam reveals no gallop and no friction rub.   No murmur heard. Pulmonary/Chest: Effort normal. No respiratory distress. She has no wheezes. She has rales. She exhibits no tenderness.  Rt side no shingles. Only scar fom remote lung bx. Bibasal crackles +  Abdominal: Soft. Bowel sounds are normal. She exhibits no distension and no mass. There is no tenderness. There is no rebound and no guarding.  Musculoskeletal: Normal range of motion. She exhibits no edema or tenderness.  Lymphadenopathy:    She has no cervical adenopathy.  Neurological: She is alert and oriented to person, place, and time. She has normal reflexes. No cranial nerve deficit. She exhibits normal muscle tone. Coordination normal.  Skin: Skin is warm and dry. No rash noted. She is not diaphoretic. No erythema. No pallor.  Psychiatric: She has a normal mood and affect. Her  behavior is normal. Judgment and thought content normal.  Vitals reviewed.     Assessment & Plan:     ICD-9-CM ICD-10-CM   1. ILD (interstitial lung disease) 515 J84.9   2. Hypersensitivity pneumonitis 495.9 J67.9   3. 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  - appears stable objectively (low dlco on PFT an artifact)  - continue prednisone 5mg  per day  - start rehab - ok for [place]  #Chronic cough  - likely  Due to residual post viral reactive cough from fall 2015, post nasal drainage, ILD And Cough neuropathy - glad it resolved last month - but now recurred due to allergies and pollent  - continue cetrizine and chlorpheniramine as needed  - continue generic fluticasone inhaler 2 squirts each nostril daily once or twice - start netti pot - at all times when you have urge to clear throat  - drink water or suck on sugarless lozenge - if persists, then will start gabapentin  #Followup  -august 2016  - walk test and office spirometry at followup - call or come sooner if cough or chest  pain continue to be problems     Dr. Brand Males, M.D., [facility].C.P Pulmonary and Critical Care Medicine Staff Physician [facility] Pager: [phone], If no answer or between  15:00h - 7:00h: call [phone]  02/26/2015 10:02 PM

## 2015-02-27 LAB — PAP IG (IMAGE GUIDED)

## 2015-02-28 ENCOUNTER — Other Ambulatory Visit: Payer: Self-pay

## 2015-02-28 ENCOUNTER — Ambulatory Visit (INDEPENDENT_AMBULATORY_CARE_PROVIDER_SITE_OTHER): Payer: Medicare Other | Admitting: Cardiology

## 2015-02-28 ENCOUNTER — Institutional Professional Consult (permissible substitution): Payer: BC Managed Care – PPO | Admitting: Cardiology

## 2015-02-28 ENCOUNTER — Encounter: Payer: Self-pay | Admitting: Cardiology

## 2015-02-28 ENCOUNTER — Encounter: Payer: Self-pay | Admitting: *Deleted

## 2015-02-28 VITALS — BP 100/54 | HR 70 | Ht 60.5 in | Wt 153.6 lb

## 2015-02-28 DIAGNOSIS — R9431 Abnormal electrocardiogram [ECG] [EKG]: Secondary | ICD-10-CM | POA: Diagnosis not present

## 2015-02-28 DIAGNOSIS — R002 Palpitations: Secondary | ICD-10-CM | POA: Diagnosis not present

## 2015-02-28 DIAGNOSIS — E785 Hyperlipidemia, unspecified: Secondary | ICD-10-CM | POA: Diagnosis not present

## 2015-02-28 HISTORY — DX: Palpitations: R00.2

## 2015-02-28 MED ORDER — FLUTICASONE PROPIONATE 50 MCG/ACT NA SUSP: 2.0000 | Freq: Every day | NASAL | Status: DC

## 2015-02-28 NOTE — Patient Instructions (Addendum)
Medication Instructions:  Your physician recommends that you continue on your current medications as directed. Please refer to the Current Medication list given to you today.   Labwork: None   Testing/Procedures: Your physician has requested that you have a lexiscan myoview. For further information please visit [email or web address]. Please follow instruction sheet, as given.  Your physician has requested that you have an echocardiogram. Echocardiography is a painless test that uses sound waves to create images of your heart. It provides your doctor with information about the size and shape of your heart and how well your heart's chambers and valves are working. This procedure takes approximately one hour. There are no restrictions for this procedure.  Your physician has recommended that you wear a holter monitor. Holter monitors are medical devices that record the heart's electrical activity. Doctors most often use these monitors to diagnose arrhythmias. Arrhythmias are problems with the speed or rhythm of the heartbeat. The monitor is a small, portable device. You can wear one while you do your normal daily activities. This is usually used to diagnose what is causing palpitations/syncope (passing out). [place]: As needed.

## 2015-02-28 NOTE — Progress Notes (Signed)
Cardiology Office Note   Date:  02/28/2015   ID:  Rhonda [name], DOB 10/29/1951, MRN [id number]  PCP:  Ellsworth Lennox, MD    No chief complaint on file.     History of Present Illness: Rhonda [name] is a 64 y.o. female who presents for evaluation of palpitation.  She has a history of ILD followed by Pulmonary, GERD, IBS and osteopenia.  She has dyslipidemia and is on statin therapy.  She says that this started about 2 months ago.  She thought it was the zyrtec and stopped it but the palpitations continued. She has a history of rapid heart rate in the past and was placed on BB therapy years ago.  She wore a Holter monitor that she says was fine.  The palpitations occur sporadically and currently on a daily basis after eating.  She denies any dizziness.  She has chronic SOB that is stable.  She is starting pulmonary rehab.  She denies any chest pain or pressure.  She started doing Chai tea in the am but did not notice any increase in palpitations.      Past Medical History  Diagnosis Date  . High cholesterol   . History of chronic bronchitis     as child  . GERD (gastroesophageal reflux disease)   . Arthritis   . Depression   . Allergy   . Asthma   . Cataract   . Pneumonitis, hypersensitivity     a. 09/2012 s/p Bx - ? 2/2 bird, mold, oil paint exposure ->on steroids, followed by pulm.  . Rapid heart rate   . DOE (dyspnea on exertion)     a. 04/2010 Lexi MV EF 71%, no ischemia/infarct;    . Hyperplastic colon polyp 2007  . Hiatal hernia   . Interstitial lung disease   . Insulin resistance     past  . Inguinal hernia     right  . Pulmonary fibrosis   . Helicobacter pylori ab+   . Heart palpitations 02/28/2015    Past Surgical History  Procedure Laterality Date  . Tubal ligation    . Foot fracture surgery  2006 or 2007    right  . Video bronchoscopy  11/19/2011    Procedure: VIDEO BRONCHOSCOPY WITH FLUORO;  Surgeon: Brand Males, MD;  [facility]  ENDOSCOPY;  Service: Endoscopy;;  . Hymenectomy    . Cesarean section    . Video assisted thoracoscopy  09/28/2012    Procedure: VIDEO ASSISTED THORACOSCOPY;  Surgeon: Melrose Nakayama, MD;  Location: [place];  Service: Thoracic;  Laterality: Right;  . Lung biopsy  09/28/2012    Procedure: LUNG BIOPSY;  Surgeon: Melrose Nakayama, MD;  Location: [place];  Service: Thoracic;  Laterality: N/A;  lung biopsies tims three  . Breast biopsy Right 2009    BIOPSY     Current Outpatient Prescriptions  Medication Sig Dispense Refill  . alendronate (FOSAMAX) 70 MG tablet Take 1 tablet (70 mg total) by mouth every 7 (seven) days. Take with a full glass of water on an empty stomach. 4 tablet 5  . Alpha-D-Galactosidase (BEANO PO) Take by mouth as needed.    . Alum Hydroxide-Mag Carbonate (GAVISCON PO) Take by mouth as needed.    . Ascorbic Acid (VITAMIN C) 1000 MG tablet Take 1,000 mg by mouth daily.    . calcium carbonate (TUMS - DOSED IN MG ELEMENTAL CALCIUM) 500 MG chewable tablet Chew 2 tablets by mouth daily.    Marland Kitchen  cetirizine (ZYRTEC) 10 MG tablet Take 1 tablet (10 mg total) by mouth daily. 90 tablet 3  . chlorpheniramine (CHLOR-TRIMETON) 4 MG tablet Take 4 mg by mouth 2 (two) times daily as needed for allergies.    Marland Kitchen lactase (LACTAID) 3000 UNITS tablet Take by mouth as needed.    . metoprolol succinate (TOPROL-XL) 50 MG 24 hr tablet TAKE 1 TABLET EVERY DAY IMMEDIATELY FOLLOWING A MEAL. 30 tablet 5  . Multiple Vitamins-Minerals (AIRBORNE PO) Take by mouth as needed.    Marland Kitchen omeprazole (PRILOSEC) 20 MG capsule Take 1 capsule (20 mg total) by mouth daily. 30 capsule 5  . pravastatin (PRAVACHOL) 40 MG tablet Take 0.5 tablets (20 mg total) by mouth daily. 45 tablet 3  . predniSONE (DELTASONE) 10 MG tablet Take 10 mg by mouth daily. Take 1/2 tablet by mouth daily    . Probiotic Product (PROBIOTIC DAILY PO) Take by mouth. Take 2 gummies daily    . fluticasone (FLONASE) 50 MCG/ACT nasal spray Place 2 sprays  into both nostrils daily. 16 g 11   No current facility-administered medications for this visit.    Allergies:   Atorvastatin; Betadine; Codeine; Rosuvastatin; Shellfish allergy; and Sulfonamide derivatives    Social History:  The patient  reports that she has never smoked. She has never used smokeless tobacco. She reports that she drinks about 1.1 oz of alcohol per week. She reports that she does not use illicit drugs.   Family History:  The patient's family history includes Asthma in her maternal grandmother and mother; Bone cancer in her paternal grandfather; Breast cancer in her paternal aunt; Dementia (age of onset: 44) in her mother; Diabetes in her father; Emphysema in her maternal grandmother; Heart disease in her sister; Hypertension in her sister; Kidney disease in her sister; Lung disease in her maternal grandfather; Lymphoma in her father; Osteoarthritis in her mother; Ovarian cancer in her maternal aunt. There is no history of Colon cancer.    ROS:  Please see the history of present illness.   Otherwise, review of systems are positive for none.   All other systems are reviewed and negative.    PHYSICAL EXAM: VS:  BP 100/54 mmHg  Pulse 70  Ht 5' 0.5" (1.537 m)  Wt 153 lb 9.6 oz (69.673 kg)  BMI 29.49 kg/m2  SpO2 95% , BMI Body mass index is 29.49 kg/(m^2). GEN: Well nourished, well developed, in no acute distress HEENT: normal Neck: no JVD, carotid bruits, or masses Cardiac: RRR; no murmurs, rubs, or gallops,no edema  Respiratory:  clear to auscultation bilaterally, normal work of breathing GI: soft, nontender, nondistended, + BS MS: no deformity or atrophy Skin: warm and dry, no rash Neuro:  Strength and sensation are intact Psych: euthymic mood, full affect   EKG:  EKG is not ordered today.    Recent Labs: 10/12/2014: TSH 3.972 02/26/2015: ALT 21; BUN 22; Creatinine 0.91; Potassium 4.8; Sodium 141    Lipid Panel    Component Value Date/Time   CHOL 184  02/26/2015 1540   TRIG 185* 02/26/2015 1540   HDL 53 02/26/2015 1540   CHOLHDL 3.5 02/26/2015 1540   VLDL 37 02/26/2015 1540   LDLCALC 94 02/26/2015 1540      Wt Readings from Last 3 Encounters:  02/28/15 153 lb 9.6 oz (69.673 kg)  02/26/15 152 lb (68.947 kg)  02/26/15 153 lb (69.4 kg)       ASSESSMENT AND PLAN:  1.  Palpitations of unknown etiology.  She has  had rapid heart beats for years and has been on lopressor.  She drinks 1 cup of caffeine in the am.  I will get a 48 Holter monitor to assess. 2.  Abnormal EKG with T wave inversions in the anterior leads that is more prominent than EKG in 2014.  I will get a stress myoview to rule out ischemia and check a 2D echo to assess LVF.   Her cardiac risk factors include hyperlipidemia and post menopausal state. 3.  Dyslipidemia - continue pravachol - PCP follow this   Current medicines are reviewed at length with the patient today.  The patient does not have concerns regarding medicines.  The following changes have been made:  no change  Labs/ tests ordered today include: see above assessment and plan No orders of the defined types were placed in this encounter.     Disposition:   FU with me PRN pending results of studies  SignedSueanne Margarita, MD  02/28/2015 3:49 PM    [facility] [address], [place], [place]  [address] Phone: [phone]; Fax: [phone]

## 2015-03-01 ENCOUNTER — Ambulatory Visit: Payer: BC Managed Care – PPO | Admitting: Internal Medicine

## 2015-03-04 ENCOUNTER — Encounter (HOSPITAL_COMMUNITY)
Admission: RE | Admit: 2015-03-04 | Discharge: 2015-03-04 | Disposition: A | Discharge status: Home / Self Care | Payer: Medicare Other | Source: Ambulatory Visit | Attending: Internal Medicine | Admitting: Internal Medicine

## 2015-03-04 ENCOUNTER — Ambulatory Visit (HOSPITAL_COMMUNITY): Payer: Medicare Other | Attending: Cardiology

## 2015-03-04 ENCOUNTER — Encounter (HOSPITAL_COMMUNITY): Payer: Self-pay

## 2015-03-04 VITALS — BP 127/73 | HR 64 | Resp 18 | Ht 59.5 in | Wt 154.5 lb

## 2015-03-04 DIAGNOSIS — R002 Palpitations: Secondary | ICD-10-CM | POA: Diagnosis present

## 2015-03-04 DIAGNOSIS — I340 Nonrheumatic mitral (valve) insufficiency: Secondary | ICD-10-CM | POA: Insufficient documentation

## 2015-03-04 DIAGNOSIS — I272 Other secondary pulmonary hypertension: Secondary | ICD-10-CM | POA: Diagnosis not present

## 2015-03-04 DIAGNOSIS — J849 Interstitial pulmonary disease, unspecified: Secondary | ICD-10-CM | POA: Insufficient documentation

## 2015-03-04 DIAGNOSIS — E785 Hyperlipidemia, unspecified: Secondary | ICD-10-CM | POA: Diagnosis not present

## 2015-03-04 DIAGNOSIS — I341 Nonrheumatic mitral (valve) prolapse: Secondary | ICD-10-CM | POA: Insufficient documentation

## 2015-03-04 DIAGNOSIS — R9431 Abnormal electrocardiogram [ECG] [EKG]: Secondary | ICD-10-CM | POA: Insufficient documentation

## 2015-03-04 DIAGNOSIS — J8410 Pulmonary fibrosis, unspecified: Secondary | ICD-10-CM

## 2015-03-04 HISTORY — DX: Nonrheumatic mitral (valve) prolapse: I34.1

## 2015-03-04 HISTORY — DX: Pulmonary hypertension, unspecified: I27.20

## 2015-03-04 NOTE — Progress Notes (Signed)
Rhonda [name] 64 y.o. female Pulmonary Rehab Orientation Note Patient arrived today in Cardiac and Pulmonary Rehab for orientation to Pulmonary Rehab. She ambulated from [employer] without difficulty. He has not been prescribed oxygen. Color good, skin warm and dry. Patient is oriented to time and place. Patient's medical history and medications reviewed. Heart rate is normal, S1 S2 present. Fine crackles noted to upper anterior lobes, clear in bases bilat. Grip strength equal, strong. Distal pulses palpable. No edema present. Patient reports she does take medications as prescribed. Patient states she follows a Regular diet. The patient reports no specific efforts to gain or lose weight. She would however like to loose the weight she has gained since her last hospitalization. Patient's weight will be monitored closely. Demonstration and practice of PLB using pulse oximeter. Patient able to return demonstration satisfactorily. Safety and hand hygiene in the exercise area reviewed with patient. Patient voices understanding of the information reviewed. Department expectations discussed with patient and achievable goals were set. The patient shows enthusiasm about attending the program and we look forward to working with this nice lady. The patient is scheduled for a 6 min walk test on Thursday 4/14 at 4:00 and to begin exercise on Tuesday 4/19 at 10:30.   45 minutes was spent on a variety of activities such as assessment of the patient, obtaining baseline data including height, weight, BMI, and grip strength, verifying medical history, allergies, and current medications, and teaching patient strategies for performing tasks with less respiratory effort with emphasis on pursed lip breathing.

## 2015-03-04 NOTE — Progress Notes (Signed)
2D Echo completed. 03/04/2015

## 2015-03-05 ENCOUNTER — Telehealth: Payer: Self-pay

## 2015-03-05 DIAGNOSIS — I272 Pulmonary hypertension, unspecified: Secondary | ICD-10-CM

## 2015-03-05 NOTE — Telephone Encounter (Signed)
Informed patient of results and verbal understanding expressed.  Repeat ECHO ordered to be scheduled in 1 year. Patient agrees with treatment plan. 

## 2015-03-05 NOTE — Telephone Encounter (Signed)
-----   Message from Sueanne Margarita, MD sent at 03/04/2015 10:00 PM EDT ----- Please let patient know that echo showed normal LVF, mild MVP with mild MR and very mild pulmonary HTN - repeat echo in 1 year for pulmonary HTN

## 2015-03-06 ENCOUNTER — Encounter (INDEPENDENT_AMBULATORY_CARE_PROVIDER_SITE_OTHER): Payer: Medicare Other

## 2015-03-06 ENCOUNTER — Encounter: Payer: Self-pay | Admitting: *Deleted

## 2015-03-06 DIAGNOSIS — R002 Palpitations: Secondary | ICD-10-CM | POA: Diagnosis not present

## 2015-03-06 DIAGNOSIS — R9431 Abnormal electrocardiogram [ECG] [EKG]: Secondary | ICD-10-CM

## 2015-03-06 DIAGNOSIS — E785 Hyperlipidemia, unspecified: Secondary | ICD-10-CM

## 2015-03-06 NOTE — Progress Notes (Signed)
Patient ID: Rhonda [name], female   DOB: Apr 10, 1951, 64 y.o.   MRN: [id number] [facility] 48 hour holter monitor applied to patient.

## 2015-03-07 ENCOUNTER — Inpatient Hospital Stay (HOSPITAL_COMMUNITY)
Admission: RE | Admit: 2015-03-07 | Discharge: 2015-03-07 | Disposition: A | Discharge status: Home / Self Care | Payer: Medicare Other | Source: Ambulatory Visit

## 2015-03-07 DIAGNOSIS — J849 Interstitial pulmonary disease, unspecified: Secondary | ICD-10-CM | POA: Diagnosis not present

## 2015-03-07 NOTE — Progress Notes (Signed)
Rhonda [name] completed a Six-Minute Walk Test on 03/07/15 . Aara walked 1,122 feet with 0 breaks.  The patient's lowest oxygen saturation was 89% %, highest heart rate was 92 bpm , and highest blood pressure was 130/62. The patient was on room air. Patient stated that nothing hindered their walk test.

## 2015-03-11 ENCOUNTER — Ambulatory Visit (HOSPITAL_COMMUNITY): Payer: Medicare Other | Attending: Cardiovascular Disease | Admitting: Radiology

## 2015-03-11 ENCOUNTER — Inpatient Hospital Stay: Admission: RE | Admit: 2015-03-11 | Payer: Medicare Other | Source: Ambulatory Visit

## 2015-03-11 DIAGNOSIS — E785 Hyperlipidemia, unspecified: Secondary | ICD-10-CM | POA: Diagnosis not present

## 2015-03-11 DIAGNOSIS — R9431 Abnormal electrocardiogram [ECG] [EKG]: Secondary | ICD-10-CM | POA: Insufficient documentation

## 2015-03-11 DIAGNOSIS — R002 Palpitations: Secondary | ICD-10-CM

## 2015-03-11 MED ORDER — TECHNETIUM TC 99M SESTAMIBI GENERIC - CARDIOLITE
11.0000 | Freq: Once | INTRAVENOUS | Status: AC | PRN
  Administered 2015-03-11: 11 via INTRAVENOUS

## 2015-03-11 MED ORDER — TECHNETIUM TC 99M SESTAMIBI GENERIC - CARDIOLITE
30.0000 | Freq: Once | INTRAVENOUS | Status: AC | PRN
  Administered 2015-03-11: 30 via INTRAVENOUS

## 2015-03-11 MED ORDER — REGADENOSON 0.4 MG/5ML IV SOLN
0.4000 mg | Freq: Once | INTRAVENOUS | Status: AC
  Administered 2015-03-11: 0.4 mg via INTRAVENOUS

## 2015-03-11 NOTE — Progress Notes (Signed)
[facility] [place], [place] [address] [phone]    Cardiology Nuclear Med Study  Rhonda [name] is a 64 y.o. female     MRN : [id number]     DOB: January 22, 1951  Procedure Date: 03/11/2015  Nuclear Med Background Indication for Stress Test:  Evaluation for Ischemia History:  MPI 2003 (normal) EF 70% Cardiac Risk Factors: Lipids  Symptoms:  Palpitations   Nuclear Pre-Procedure Caffeine/Decaff Intake:  None NPO After: 7:00pm   Lungs:  clear O2 Sat: 95% on room air. IV 0.9% NS with Angio Cath:  22g  IV Site: R Hand  IV Started by:  Matilde Haymaker, RN  Chest Size (in):  38 Cup Size: C  Height: 5' (1.524 m)  Weight:  152 lb (68.947 kg)  BMI:  Body mass index is 29.69 kg/(m^2). Tech Comments:  No Toprol x 11 hrs    Nuclear Med Study 1 or 2 day study: 1 day  Stress Test Type:  Lexiscan  Reading MD: n/a  Order Authorizing Provider:  Tressia Miners Turner,MD  Resting Radionuclide: Technetium 70m Sestamibi  Resting Radionuclide Dose: 11.0 mCi   Stress Radionuclide:  Technetium 46m Sestamibi  Stress Radionuclide Dose: 33.0 mCi           Stress Protocol Rest HR: 51 Stress HR: 91  Rest BP: 156/78 Stress BP: 130/65  Exercise Time (min): n/a METS: n/a   Predicted Max HR: 154 bpm % Max HR: 59.09 bpm Rate Pressure Product: 11921   Dose of Adenosine (mg):  n/a Dose of Lexiscan: 0.4 mg  Dose of Atropine (mg): n/a Dose of Dobutamine: n/a mcg/kg/min (at max HR)  Stress Test Technologist: Glade Lloyd, BS-ES  Nuclear Technologist:  Earl Many, CNMT     Rest Procedure:  Myocardial perfusion imaging was performed at rest 45 minutes following the intravenous administration of Technetium 58m Sestamibi. Rest ECG: Normal sinus rhythm. Nonspecific ST-T wave changes.  Stress Procedure:  The patient received IV Lexiscan 0.4 mg over 15-seconds.  Technetium 50m Sestamibi injected at 30-seconds.  Quantitative spect images were obtained after a 45  minute delay.  During the infusion of Lexiscan the patient complained of SOB, stomach cramps, headache and lightheadedness.  These symptoms began to resolve in recovery.  Stress ECG: No significant change from baseline ECG  QPS Raw Data Images:  Normal; no motion artifact; normal heart/lung ratio. Stress Images:  Normal homogeneous uptake in all areas of the myocardium. Rest Images:  Normal homogeneous uptake in all areas of the myocardium. Subtraction (SDS):  No evidence of ischemia. Transient Ischemic Dilatation (Normal <1.22):  0.98 Lung/Heart Ratio (Normal <0.45):  0.40  Quantitative Gated Spect Images QGS EDV:  68 ml QGS ESV:  19 ml  Impression Exercise Capacity:  Lexiscan with no exercise. BP Response:  Normal blood pressure response. Clinical Symptoms:  Shortness of breath ECG Impression:  No significant ST segment change suggestive of ischemia. Comparison with Prior Nuclear Study: No images to compare  Overall Impression:  Normal stress nuclear study. There is no scar or ischemia. This is a low risk scan.  LV Ejection Fraction: 72%.  LV Wall Motion:  Normal Wall Motion.  Daryel November, MD

## 2015-03-12 ENCOUNTER — Encounter (HOSPITAL_COMMUNITY)
Admission: RE | Admit: 2015-03-12 | Discharge: 2015-03-12 | Disposition: A | Discharge status: Home / Self Care | Payer: Medicare Other | Source: Ambulatory Visit | Attending: Internal Medicine | Admitting: Internal Medicine

## 2015-03-12 ENCOUNTER — Telehealth: Payer: Self-pay | Admitting: Cardiology

## 2015-03-12 DIAGNOSIS — J849 Interstitial pulmonary disease, unspecified: Secondary | ICD-10-CM | POA: Diagnosis not present

## 2015-03-12 NOTE — Progress Notes (Signed)
Today, Rhonda [name] exercised at [facility]. Service time was from 10:30am to 12:10pm.  The patient exercised by performing aerobic, strengthening, and stretching exercises. Oxygen saturation, heart rate, blood pressure, rate of perceived exertion, and shortness of breath were all monitored before, during, and after exercise. Rhonda [name] presented with no problems at today's exercise session.  The patient did have an increase in workload intensity during today's exercise session.  Pre-exercise vitals: . Weight kg: 71.0 . Liters of O2: ra . SpO2: 95 . HR: 69 . BP: 112/60 . CBG: na  Exercise vitals: . Highest heartrate:  95 . Lowest oxygen saturation: 91 . Highest blood pressure: 134/62 . Liters of 02: ra  Post-exercise vitals: . SpO2: 94 . HR: 71 . BP: 122/78 . Liters of O2: ra . CBG: na  Dr. Brand Males, Medical Director Dr. Wyline Copas is immediately available during today's Pulmonary Rehab session for Rhonda [name] on 03/12/15 at 10:30am class time.

## 2015-03-12 NOTE — Telephone Encounter (Signed)
New message ° ° ° ° ° °Calling to get test results °

## 2015-03-12 NOTE — Telephone Encounter (Signed)
Informed patient of stress test results and verbal understanding expressed.

## 2015-03-14 ENCOUNTER — Telehealth: Payer: Self-pay | Admitting: Cardiology

## 2015-03-14 ENCOUNTER — Encounter (HOSPITAL_COMMUNITY): Payer: Medicare Other

## 2015-03-14 NOTE — Telephone Encounter (Signed)
Please let patient know that heart monitor showed NSR with PVC's, multifocal ventricular couplets, nonsustained atrial tachycardia up to 4 beats - these are benign.  Patient needs to cut ou caffeine.

## 2015-03-18 NOTE — Telephone Encounter (Signed)
Informed patient of results and verbal understanding expressed.  Instructed patient to cut out caffeine.  Patient agrees with treatment plan.

## 2015-03-19 ENCOUNTER — Encounter (HOSPITAL_COMMUNITY)
Admission: RE | Admit: 2015-03-19 | Discharge: 2015-03-19 | Disposition: A | Discharge status: Home / Self Care | Payer: Medicare Other | Source: Ambulatory Visit | Attending: Internal Medicine | Admitting: Internal Medicine

## 2015-03-19 DIAGNOSIS — J849 Interstitial pulmonary disease, unspecified: Secondary | ICD-10-CM | POA: Diagnosis not present

## 2015-03-19 NOTE — Progress Notes (Signed)
Today, [name] exercised at [facility]. Service time was from 10:30am to 12:20pm.  The patient exercised by performing aerobic, strengthening, and stretching exercises. Oxygen saturation, heart rate, blood pressure, rate of perceived exertion, and shortness of breath were all monitored before, during, and after exercise. [name] presented with no problems at today's exercise session.  The patient did have an increase in workload intensity during today's exercise session.  Pre-exercise vitals: . Weight kg: 70.1 . Liters of O2: ra . SpO2: 94 . HR: 68 . BP: 104/60 . CBG: na  Exercise vitals: . Highest heartrate:  84 . Lowest oxygen saturation: 90 . Highest blood pressure: 130/80 . Liters of 02: ra  Post-exercise vitals: . SpO2: 96 . HR: 67 . BP: 120/68 . Liters of O2: a . CBG: na  Dr. Brand Males, Medical Director Dr. Sheran Fava is immediately available during today's Pulmonary Rehab session for Rhonda [name] on 03/19/15 at 10:30am class time.

## 2015-03-21 ENCOUNTER — Encounter (HOSPITAL_COMMUNITY)
Admission: RE | Admit: 2015-03-21 | Discharge: 2015-03-21 | Disposition: A | Discharge status: Home / Self Care | Payer: Medicare Other | Source: Ambulatory Visit | Attending: Internal Medicine | Admitting: Internal Medicine

## 2015-03-21 DIAGNOSIS — J849 Interstitial pulmonary disease, unspecified: Secondary | ICD-10-CM | POA: Diagnosis not present

## 2015-03-21 NOTE — Progress Notes (Signed)
Today, Ronica exercised at [facility]. Service time was from 10:30am to 12:35pm.  The patient exercised by performing aerobic, strengthening, and stretching exercises. Oxygen saturation, heart rate, blood pressure, rate of perceived exertion, and shortness of breath were all monitored before, during, and after exercise. Tiea presented with no problems at today's exercise session. The patient attended education today with Exercise Physiologist Nicholi Ghuman on Exercising at Home.  The patient did have an increase in workload intensity during today's exercise session.  Pre-exercise vitals: . Weight kg: 70.5 . Liters of O2: ra . SpO2: 93 . HR: 64 . BP: 114/66 . CBG: na  Exercise vitals: . Highest heartrate:  88 . Lowest oxygen saturation: 88 . Highest blood pressure: 124/60 . Liters of 02: ra  Post-exercise vitals: . SpO2: 95 . HR: 64 . BP: 110/50 . Liters of O2: ra . CBG: na  Dr. Brand Males, Medical Director Dr. Erlinda Hong is immediately available during today's Pulmonary Rehab session for NATAYLA CADENHEAD on 03/21/15 at 10:30am class time.

## 2015-03-26 ENCOUNTER — Encounter (HOSPITAL_COMMUNITY)
Admission: RE | Admit: 2015-03-26 | Discharge: 2015-03-26 | Disposition: A | Discharge status: Home / Self Care | Payer: Medicare Other | Source: Ambulatory Visit | Attending: Internal Medicine | Admitting: Internal Medicine

## 2015-03-26 DIAGNOSIS — J849 Interstitial pulmonary disease, unspecified: Secondary | ICD-10-CM | POA: Insufficient documentation

## 2015-03-26 NOTE — Progress Notes (Signed)
I have reviewed a Home Exercise Prescription with Rhonda [name] . Rhonda [name] is currently exercising at home.  The patient was advised to walk 2-3 days a week for 30 minutes. She was also advised to swim 25 minutes on those days.  Rhonda [name] and I discussed how to progress their exercise prescription.  The patient stated that their goals were walk uphill and stairs without getting short of breath, travel more, and prepare herself physically for her [place].  The patient stated that they understand the exercise prescription.  We reviewed exercise guidelines, target heart rate during exercise, oxygen use, weather, home pulse oximeter, endpoints for exercise, and goals.  Patient is encouraged to come to me with any questions. I will continue to follow up with the patient to assist them with progression and safety.

## 2015-03-26 NOTE — Progress Notes (Signed)
Today, Keyon exercised at [facility]. Service time was from 10:30am to 12:20pm.  The patient exercised by performing aerobic, strengthening, and stretching exercises. Oxygen saturation, heart rate, blood pressure, rate of perceived exertion, and shortness of breath were all monitored before, during, and after exercise. Tameria presented with no problems at today's exercise session.  The patient did not have an increase in workload intensity during today's exercise session.  Pre-exercise vitals: . Weight kg: 69.9 . Liters of O2: ra . SpO2: 95 . HR: 72 . BP: 110/60 . CBG: na  Exercise vitals: . Highest heartrate:  92 . Lowest oxygen saturation: 95 . Highest blood pressure: 130/60 . Liters of 02: ra  Post-exercise vitals: . SpO2: 95 . HR: 67 . BP: 104/70 . Liters of O2: ra . CBG: na  Dr. Brand Males, Medical Director Dr. Algis Liming is immediately available during today's Pulmonary Rehab session for Aretha Parrot on 03/26/15 at 10:30am class time.

## 2015-03-28 ENCOUNTER — Encounter (HOSPITAL_COMMUNITY)
Admission: RE | Admit: 2015-03-28 | Discharge: 2015-03-28 | Disposition: A | Discharge status: Home / Self Care | Payer: Medicare Other | Source: Ambulatory Visit | Attending: Internal Medicine | Admitting: Internal Medicine

## 2015-03-28 DIAGNOSIS — J849 Interstitial pulmonary disease, unspecified: Secondary | ICD-10-CM | POA: Diagnosis not present

## 2015-03-28 NOTE — Progress Notes (Signed)
Today, Rhonda [name] exercised at [facility]. Service time was from 10:30am to 12:15pm.  The patient exercised by performing aerobic, strengthening, and stretching exercises. Oxygen saturation, heart rate, blood pressure, rate of perceived exertion, and shortness of breath were all monitored before, during, and after exercise. Rhonda [name] presented with no problems at today's exercise session. The patient attended education today with Rosebud Poles on Warning Signs and Symptoms of Infection.  The patient did have an increase in workload intensity during today's exercise session.  Pre-exercise vitals: . Weight kg: 69.5 . Liters of O2: ra . SpO2: 95 . HR: 65 . BP: 132/66 . CBG: na  Exercise vitals: . Highest heartrate:  86 . Lowest oxygen saturation: 90 . Highest blood pressure: 122/60 . Liters of 02: ra  Post-exercise vitals: . SpO2: 95 . HR: 66 . BP: 104/60 . Liters of O2: ra . CBG: na  Dr. Brand Males, Medical Director Dr. Algis Liming is immediately available during today's Pulmonary Rehab session for Aretha Parrot on 03/28/15 at 10:30am class time.

## 2015-04-02 ENCOUNTER — Encounter (HOSPITAL_COMMUNITY): Payer: Medicare Other

## 2015-04-04 ENCOUNTER — Encounter (HOSPITAL_COMMUNITY)
Admission: RE | Admit: 2015-04-04 | Discharge: 2015-04-04 | Disposition: A | Discharge status: Home / Self Care | Payer: Medicare Other | Source: Ambulatory Visit | Attending: Internal Medicine | Admitting: Internal Medicine

## 2015-04-04 DIAGNOSIS — J849 Interstitial pulmonary disease, unspecified: Secondary | ICD-10-CM | POA: Diagnosis not present

## 2015-04-04 NOTE — Progress Notes (Signed)
Today, Rhonda [name] exercised at [facility]. Service time was from 10:30am to 12:30pm.  The patient exercised by performing aerobic, strengthening, and stretching exercises. Oxygen saturation, heart rate, blood pressure, rate of perceived exertion, and shortness of breath were all monitored before, during, and after exercise. [name] presented with no problems at today's exercise session. The patient attended Pursed Lip and Diaphragmatic Breathing exercise today.  The patient did have an increase in workload intensity during today's exercise session.  Pre-exercise vitals: . Weight kg: 69.8 . Liters of O2: ra . SpO2: 93 . HR: 67 . BP: 110/54 . CBG: na  Exercise vitals: . Highest heartrate:  97 . Lowest oxygen saturation: 88 . Highest blood pressure: 122/72 . Liters of 02: ra  Post-exercise vitals: . SpO2: 96 . HR: 70 . BP: 106/68 . Liters of O2: ra . CBG: na  Dr. Brand Males, Medical Director Dr. Wendee Beavers is immediately available during today's Pulmonary Rehab session for Rhonda [name] on 04/04/15 at 10:30am class time.

## 2015-04-09 ENCOUNTER — Encounter (HOSPITAL_COMMUNITY)
Admission: RE | Admit: 2015-04-09 | Discharge: 2015-04-09 | Disposition: A | Discharge status: Home / Self Care | Payer: Medicare Other | Source: Ambulatory Visit | Attending: Internal Medicine | Admitting: Internal Medicine

## 2015-04-09 DIAGNOSIS — J849 Interstitial pulmonary disease, unspecified: Secondary | ICD-10-CM | POA: Diagnosis not present

## 2015-04-09 NOTE — Progress Notes (Signed)
Today, [name] exercised at [facility]. Service time was from 10:30am to 12:20pm.  The patient exercised by performing aerobic, strengthening, and stretching exercises. Oxygen saturation, heart rate, blood pressure, rate of perceived exertion, and shortness of breath were all monitored before, during, and after exercise. [name] presented with no problems at today's exercise session.  The patient did not have an increase in workload intensity during today's exercise session.  Pre-exercise vitals: . Weight kg: 69.5 . Liters of O2: ra . SpO2: 95 . HR: 74 . BP: 102/54 . CBG: na  Exercise vitals: . Highest heartrate:  86 . Lowest oxygen saturation: 93 . Highest blood pressure: 128/60 . Liters of 02: ra  Post-exercise vitals: . SpO2: 95 . HR: 73 . BP: 112/60 . Liters of O2: ra . CBG: na  Dr. Brand Males, Medical Director Dr. Wendee Beavers is immediately available during today's Pulmonary Rehab session for Rhonda [name] on 04/09/15 at 10:30am class time.

## 2015-04-11 ENCOUNTER — Encounter (HOSPITAL_COMMUNITY)
Admission: RE | Admit: 2015-04-11 | Discharge: 2015-04-11 | Disposition: A | Discharge status: Home / Self Care | Payer: Medicare Other | Source: Ambulatory Visit | Attending: Internal Medicine | Admitting: Internal Medicine

## 2015-04-11 DIAGNOSIS — J849 Interstitial pulmonary disease, unspecified: Secondary | ICD-10-CM | POA: Diagnosis not present

## 2015-04-11 NOTE — Progress Notes (Signed)
Today, October exercised at [facility]. Service time was from 1030 to 1220.  The patient exercised by performing aerobic, strengthening, and stretching exercises. Oxygen saturation, heart rate, blood pressure, rate of perceived exertion, and shortness of breath were all monitored before, during, and after exercise. [name] presented with no problems at today's exercise session. [name] also attended an education session on stress and energy conservation.  The patient did not have an increase in workload intensity during today's exercise session.  Pre-exercise vitals: . Weight kg: 69.7 . Liters of O2: ra . SpO2: 94 . HR: 70 . BP: 118/64 . CBG: na  Exercise vitals: . Highest heartrate:  81 . Lowest oxygen saturation: 92 . Highest blood pressure: 10-6/60 . Liters of 02: ra  Post-exercise vitals: . SpO2: 96 . HR: 73 . BP: 102/60 . Liters of O2: ra . CBG: na  Dr. Brand Males, Medical Director Dr. Ree Kida is immediately available during today's Pulmonary Rehab session for Rhonda [name] on 04/11/2015 at 1030 class time.

## 2015-04-12 LAB — PULMONARY FUNCTION TEST
DL/VA % PRED: 93 %
DL/VA: 3.95 ml/min/mmHg/L
DLCO UNC % PRED: 63 %
DLCO UNC: 11.99 ml/min/mmHg
FEF 25-75 POST: 2.06 L/s
FEF 25-75 Pre: 1.91 L/sec
FEF2575-%Change-Post: 8 %
FEF2575-%PRED-PRE: 99 %
FEF2575-%Pred-Post: 107 %
FEV1-%Change-Post: 1 %
FEV1-%PRED-PRE: 75 %
FEV1-%Pred-Post: 77 %
FEV1-Post: 1.59 L
FEV1-Pre: 1.56 L
FEV1FVC-%Change-Post: 4 %
FEV1FVC-%Pred-Pre: 109 %
FEV6-%CHANGE-POST: -3 %
FEV6-%PRED-POST: 69 %
FEV6-%PRED-PRE: 71 %
FEV6-PRE: 1.86 L
FEV6-Post: 1.79 L
FEV6FVC-%PRED-POST: 104 %
FEV6FVC-%Pred-Pre: 104 %
FVC-%CHANGE-POST: -3 %
FVC-%PRED-POST: 66 %
FVC-%Pred-Pre: 69 %
FVC-POST: 1.79 L
FVC-Pre: 1.86 L
POST FEV1/FVC RATIO: 88 %
POST FEV6/FVC RATIO: 100 %
PRE FEV1/FVC RATIO: 84 %
Pre FEV6/FVC Ratio: 100 %
RV % pred: 72 %
RV: 1.37 L
TLC % pred: 72 %
TLC: 3.22 L

## 2015-04-16 ENCOUNTER — Encounter (HOSPITAL_COMMUNITY): Payer: Medicare Other

## 2015-04-18 ENCOUNTER — Telehealth (HOSPITAL_COMMUNITY): Payer: Self-pay | Admitting: *Deleted

## 2015-04-18 ENCOUNTER — Encounter (HOSPITAL_COMMUNITY): Admission: RE | Admit: 2015-04-18 | Payer: Medicare Other | Source: Ambulatory Visit

## 2015-04-23 ENCOUNTER — Encounter (HOSPITAL_COMMUNITY): Payer: Medicare Other

## 2015-04-25 ENCOUNTER — Encounter (HOSPITAL_COMMUNITY): Payer: Medicare Other

## 2015-04-30 ENCOUNTER — Encounter (HOSPITAL_COMMUNITY): Payer: Medicare Other

## 2015-05-01 ENCOUNTER — Ambulatory Visit
Admission: RE | Admit: 2015-05-01 | Discharge: 2015-05-01 | Disposition: A | Discharge status: Home / Self Care | Payer: Medicare Other | Source: Ambulatory Visit | Attending: Rheumatology | Admitting: Rheumatology

## 2015-05-01 ENCOUNTER — Other Ambulatory Visit: Payer: Self-pay | Admitting: Rheumatology

## 2015-05-01 DIAGNOSIS — M25551 Pain in right hip: Secondary | ICD-10-CM

## 2015-05-02 ENCOUNTER — Encounter (HOSPITAL_COMMUNITY)
Admission: RE | Admit: 2015-05-02 | Discharge: 2015-05-02 | Disposition: A | Discharge status: Home / Self Care | Payer: Medicare Other | Source: Ambulatory Visit | Attending: Internal Medicine | Admitting: Internal Medicine

## 2015-05-02 DIAGNOSIS — J849 Interstitial pulmonary disease, unspecified: Secondary | ICD-10-CM | POA: Diagnosis present

## 2015-05-02 NOTE — Progress Notes (Signed)
Today, Okla exercised at [facility]. Service time was from 10:30am to 12:10pm.  The patient exercised by performing aerobic, strengthening, and stretching exercises. Oxygen saturation, heart rate, blood pressure, rate of perceived exertion, and shortness of breath were all monitored before, during, and after exercise. [name] presented with no problems at today's exercise session.  The patient did not have an increase in workload intensity during today's exercise session.  Pre-exercise vitals: . Weight kg: 70.2 . Liters of O2: ra . SpO2: 94 . HR: 69 . BP: 110/60 . CBG: na  Exercise vitals: . Highest heartrate:  88 . Lowest oxygen saturation: 91 . Highest blood pressure: 118/70 . Liters of 02: ra  Post-exercise vitals: . SpO2: 97 . HR: 70 . BP: 108/66 . Liters of O2: ra . CBG: na  Dr. Brand Males, Medical Director Dr. Hartford Poli is immediately available during today's Pulmonary Rehab session for Rhonda [name] on 05/02/15 at 10:30am class time.

## 2015-05-03 ENCOUNTER — Telehealth: Payer: Self-pay | Admitting: Internal Medicine

## 2015-05-03 ENCOUNTER — Telehealth: Payer: Self-pay

## 2015-05-03 MED ORDER — ALENDRONATE SODIUM 70 MG PO TABS: 70.0000 mg | ORAL_TABLET | ORAL | Status: DC

## 2015-05-03 MED ORDER — PREDNISONE 5 MG PO TABS: 5.0000 mg | ORAL_TABLET | Freq: Every day | ORAL | Status: DC

## 2015-05-03 MED ORDER — PRAVASTATIN SODIUM 40 MG PO TABS: 20.0000 mg | ORAL_TABLET | Freq: Every day | ORAL | Status: DC

## 2015-05-03 MED ORDER — METOPROLOL SUCCINATE ER 50 MG PO TB24: ORAL_TABLET | ORAL | Status: DC

## 2015-05-03 NOTE — Telephone Encounter (Signed)
Called pt, she needs a

## 2015-05-03 NOTE — Telephone Encounter (Signed)
Patient states she needs ALL of her meds refilled at [facility].  [phone]

## 2015-05-03 NOTE — Telephone Encounter (Signed)
Called and spoke to pt. Pt requesting 90 supply of pred 5mg  tabs. Rx sent to preferred pharmacy. Pt verbalized understanding and denied any further questions or concerns at this time.

## 2015-05-06 ENCOUNTER — Other Ambulatory Visit: Payer: Self-pay | Admitting: Rheumatology

## 2015-05-06 DIAGNOSIS — M1611 Unilateral primary osteoarthritis, right hip: Secondary | ICD-10-CM

## 2015-05-06 DIAGNOSIS — M25551 Pain in right hip: Secondary | ICD-10-CM

## 2015-05-06 DIAGNOSIS — R1031 Right lower quadrant pain: Secondary | ICD-10-CM

## 2015-05-07 ENCOUNTER — Encounter (HOSPITAL_COMMUNITY)
Admission: RE | Admit: 2015-05-07 | Discharge: 2015-05-07 | Disposition: A | Discharge status: Home / Self Care | Payer: Medicare Other | Source: Ambulatory Visit | Attending: Internal Medicine | Admitting: Internal Medicine

## 2015-05-07 DIAGNOSIS — J849 Interstitial pulmonary disease, unspecified: Secondary | ICD-10-CM | POA: Diagnosis not present

## 2015-05-07 NOTE — Progress Notes (Signed)
Today, [name] exercised at [facility]. Service time was from 10:30am to 12:05pm.  The patient exercised by performing aerobic, strengthening, and stretching exercises. Oxygen saturation, heart rate, blood pressure, rate of perceived exertion, and shortness of breath were all monitored before, during, and after exercise. [name] presented with no problems at today's exercise session.  The patient did not have an increase in workload intensity during today's exercise session.  Pre-exercise vitals: . Weight kg: 69.9 . Liters of O2: ra . SpO2: 95 . HR: 70 . BP: 120/56 . CBG: na  Exercise vitals: . Highest heartrate:  85 . Lowest oxygen saturation: 90 . Highest blood pressure: 104/60 . Liters of 02: ra  Post-exercise vitals: . SpO2: 94 . HR: 73 . BP: 110/62 . Liters of O2: ra . CBG: na  Dr. Brand Males, Medical Director Dr. Hartford Poli is immediately available during today's Pulmonary Rehab session for Rhonda [name] on 05/07/15 at 10:30am class time.

## 2015-05-08 ENCOUNTER — Ambulatory Visit
Admission: RE | Admit: 2015-05-08 | Discharge: 2015-05-08 | Disposition: A | Discharge status: Home / Self Care | Payer: Medicare Other | Source: Ambulatory Visit | Attending: Rheumatology | Admitting: Rheumatology

## 2015-05-08 DIAGNOSIS — M1611 Unilateral primary osteoarthritis, right hip: Secondary | ICD-10-CM

## 2015-05-08 DIAGNOSIS — R1031 Right lower quadrant pain: Secondary | ICD-10-CM

## 2015-05-08 DIAGNOSIS — M25551 Pain in right hip: Secondary | ICD-10-CM

## 2015-05-08 MED ORDER — METHYLPREDNISOLONE ACETATE 40 MG/ML INJ SUSP (RADIOLOG
120.0000 mg | Freq: Once | INTRAMUSCULAR | Status: AC
  Administered 2015-05-08: 120 mg via INTRA_ARTICULAR

## 2015-05-08 MED ORDER — IOHEXOL 180 MG/ML  SOLN
1.0000 mL | Freq: Once | INTRAMUSCULAR | Status: AC | PRN
  Administered 2015-05-08: 1 mL via INTRA_ARTICULAR

## 2015-05-09 ENCOUNTER — Encounter (HOSPITAL_COMMUNITY): Payer: Medicare Other

## 2015-05-14 ENCOUNTER — Encounter (HOSPITAL_COMMUNITY)
Admission: RE | Admit: 2015-05-14 | Discharge: 2015-05-14 | Disposition: A | Discharge status: Home / Self Care | Payer: Medicare Other | Source: Ambulatory Visit | Attending: Internal Medicine | Admitting: Internal Medicine

## 2015-05-14 DIAGNOSIS — J849 Interstitial pulmonary disease, unspecified: Secondary | ICD-10-CM | POA: Diagnosis not present

## 2015-05-14 NOTE — Progress Notes (Signed)
Today, Rhonda [name] exercised at [facility]. Service time was from 10:30am to 12:10pm.  The patient exercised by performing aerobic, strengthening, and stretching exercises. Oxygen saturation, heart rate, blood pressure, rate of perceived exertion, and shortness of breath were all monitored before, during, and after exercise. [name] presented with no problems at today's exercise session.  The patient did not have an increase in workload intensity during today's exercise session.  Pre-exercise vitals: . Weight kg: 69.9 . Liters of O2: ra . SpO2: 96 . HR: 66 . BP: 96/50 . CBG: na  Exercise vitals: . Highest heartrate:  83 . Lowest oxygen saturation: 94 . Highest blood pressure: 106/66 . Liters of 02: ra  Post-exercise vitals: . SpO2: 95 . HR: 71 . BP: 98/60 . Liters of O2: ra . CBG: na  Dr. Brand Males, Medical Director Dr. Tana Coast is immediately available during today's Pulmonary Rehab session for Rhonda [name] on 05/14/15 at 10:30am class time.

## 2015-05-16 ENCOUNTER — Encounter (HOSPITAL_COMMUNITY): Payer: Medicare Other

## 2015-05-20 ENCOUNTER — Other Ambulatory Visit: Payer: Self-pay

## 2015-05-21 ENCOUNTER — Ambulatory Visit: Payer: Medicare Other | Admitting: Emergency Medicine

## 2015-05-21 ENCOUNTER — Other Ambulatory Visit: Payer: Self-pay | Admitting: Emergency Medicine

## 2015-05-21 ENCOUNTER — Encounter (HOSPITAL_COMMUNITY): Payer: Medicare Other

## 2015-05-21 DIAGNOSIS — R112 Nausea with vomiting, unspecified: Secondary | ICD-10-CM

## 2015-05-21 MED ORDER — SCOPOLAMINE 1 MG/3DAYS TD PT72: MEDICATED_PATCH | TRANSDERMAL | Status: DC

## 2015-05-23 ENCOUNTER — Encounter (HOSPITAL_COMMUNITY): Payer: Medicare Other

## 2015-05-23 ENCOUNTER — Telehealth: Payer: Self-pay

## 2015-05-23 MED ORDER — ALENDRONATE SODIUM 70 MG PO TABS: 70.0000 mg | ORAL_TABLET | ORAL | Status: DC

## 2015-05-23 NOTE — Telephone Encounter (Signed)
Rx was sent. Pt notified

## 2015-05-23 NOTE — Progress Notes (Signed)
Pulmonary Rehab Discharge Note: Lynnetta has been discharged from pulmonary rehab per her request. This decision was made by Olin Hauser because of ongoing hip pain that is worsened by exercise. Jahayra also states she is going on a month long vacation to [place]. She continues to exercise at home as much as her hip will allow. She attended 11 exercise and education sessions but her attendance was very irregular. She did not meet her personal goal of loosing weight (goal of 140 lbs). She also did not progress to regain the ability to walk "hilly" or uneven terrain.

## 2015-05-23 NOTE — Telephone Encounter (Signed)
Patient is requesting a 90 day supply of alendronate (FOSAMAX) 70 MG tablet  she is going on a month long vacation.   [place]   [phone]

## 2015-05-28 ENCOUNTER — Encounter (HOSPITAL_COMMUNITY): Payer: Medicare Other

## 2015-05-30 ENCOUNTER — Encounter (HOSPITAL_COMMUNITY): Payer: Medicare Other

## 2015-06-04 ENCOUNTER — Encounter (HOSPITAL_COMMUNITY): Payer: Medicare Other

## 2015-06-06 ENCOUNTER — Encounter (HOSPITAL_COMMUNITY): Payer: Medicare Other

## 2015-06-11 ENCOUNTER — Encounter (HOSPITAL_COMMUNITY): Payer: Medicare Other

## 2015-06-13 ENCOUNTER — Encounter (HOSPITAL_COMMUNITY): Payer: Medicare Other

## 2015-06-18 ENCOUNTER — Encounter (HOSPITAL_COMMUNITY): Payer: Medicare Other

## 2015-06-20 ENCOUNTER — Encounter (HOSPITAL_COMMUNITY): Payer: Medicare Other

## 2015-06-24 ENCOUNTER — Telehealth: Payer: Self-pay

## 2015-06-24 NOTE — Telephone Encounter (Signed)
06/24/15 I copied 02/22/15 CT Chest on disc and gave to [facility] for ROI.Britt Bottom

## 2015-07-08 ENCOUNTER — Encounter: Payer: Self-pay | Admitting: Internal Medicine

## 2015-07-08 ENCOUNTER — Ambulatory Visit (INDEPENDENT_AMBULATORY_CARE_PROVIDER_SITE_OTHER): Payer: Medicare Other | Admitting: Internal Medicine

## 2015-07-08 VITALS — BP 132/78 | HR 71 | Ht 59.0 in | Wt 157.8 lb

## 2015-07-08 DIAGNOSIS — R053 Chronic cough: Secondary | ICD-10-CM

## 2015-07-08 DIAGNOSIS — R05 Cough: Secondary | ICD-10-CM

## 2015-07-08 DIAGNOSIS — Z01811 Encounter for preprocedural respiratory examination: Secondary | ICD-10-CM | POA: Diagnosis not present

## 2015-07-08 DIAGNOSIS — J849 Interstitial pulmonary disease, unspecified: Secondary | ICD-10-CM | POA: Diagnosis not present

## 2015-07-08 DIAGNOSIS — J679 Hypersensitivity pneumonitis due to unspecified organic dust: Secondary | ICD-10-CM | POA: Diagnosis not present

## 2015-07-08 NOTE — Progress Notes (Signed)
Subjective:    Patient ID: Rhonda [name], female    DOB: 13-Dec-1950, 64 y.o.   MRN: [id number]  HPI       #GE reflux with small hiatal hernia  - on ppi  #MEtabolic Syndrome  - On low glycemic diet  159#  on 09/19/12 147# on 11/21/12 145# on 01/02/13 145# on 02/28/2013 147# - on 05/25/2013 154# - 09/18/14   #History of rapid heart rate not otherwise specified  - Start her on Lopressor  2008/2009 by primary care physician. History appears to correlate with onset of respiratory issues  - refuses to dc this drug as of 2014 discussion   # Hypersensitivity Pneumonitis and ILD  - Potential etiologies - cockateil x 2 for 18 years through end 2012. 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 Denies mold but lives in house built in [place] with a "weird baselment: and has humidifier  - first noted on CT chest 10/15/09 following trip to [place] (PE negative) but not described in 2003 CT chest report  - autoimmune panel: 10/23/11: Negative  -  Uderwennt bronch 11/19/11  - non diagnostic  - VATS Nov 2013 (done after initially refusing)- [facility]. However, there is worrying trend of UIP pattern in the Upper lobes.            Oct/Nov 2012  March 2013 08/11/12  Nov/Dec 2013 Dx HP/UIP 01/02/2013  02/28/13 05/25/2013  12/06/2013  03/15/2014  06/29/2014  10'27/15 12/19/2014  02/26/15  Symp/Signs Dyspnea x5y  New crackles  dimished dyspnea except at hill. Improved crackles.  Improved dyspnea except @ hill         Fev1  1.9 L   1.7L/90%  1.66 L/87%   1.66 L/78%  1.75L/90% 1.68L/82% 1.56/75 1.6L/78% 1.48/75% 1.58L/77%  1.52L/78%  FVC  2.6 L    2.2 L/82%   2.1 L/80%   2.19 L/82%  2.2L/91% 2.1/81% 1.86/69% 1.9L/71% 1.86/75% 1.75L/65% 1.82/73% (dec 2015 offce spiro) 2.84L/72%  Ratio     79   76/95%  80/99% 80/100%  84  90    fef 25-75%               TLC  4.3 L    3.3 L/79%  3.5 L/82%      3.22/72% 3.06L/68%  3.06/68%  3.78/87%  DLCO  10/50%    10/51%   9.6/51%     11.9/63% 11.7/62%  11/561%  5.5/31%  Walk test 185 ft x 3 laps on RA  rest 92%. Pk exertion 90%. Pk HR 108 Rest 92%. Pk exertion 86% at 2.5 laps. Pk HR 102  pk exertion - 88% Rest 96%. Pk 95%. Pk HR is 75. Done at full dose lopressor Rest 94%. Pk 91%. Rest HR 93 with pk HR 103/min. Done at half dose lopressor REst 96%. Pk pulse ox 92%. Rest HR 65. Pk HR 86 Did not desat. Normal Pk HR  Pulse ox 96%, droped to89% at 2nd lap but bounced up and ended at 91% 3rd lap Did not desat Did not desat, Pk HHR 93   CT chest   unchanged            Tests/Bx Bronch 0- non diagnostic   VATS - HP with UIP in Upper lobe           RX    Rx pred start 11/23/12 On  pred 30mg  per day and  will reduce to 20mg  On Pred 20mg  per day On pred 10mg  per day On pred 5mg  per day Rx pred 5mg  daily She reduced to pred 5mg  x past 1 month QOD due to GI concern side effects On pred 5mg  qod but will increase to 5/d after this visit On pred 5mg  daily                    OV 12/19/2014  Chief Complaint  Patient presents with  . Follow-up    Pt stated she is much improved since being sick. Pt c/o DOE d/t cold weather, prod cough with white mucus, PND and epigastric pain.     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 Now cough has improved to near baseline.  Stil bothered by the residual cough which seems to have significant neurogenic component (cleras throat, gag etc., ). Dyspnea on exertion is class 2 and stable compated to last visit with stable spirometry and walking desat test. However, husband feels over time pst few years dyspnea is progressive. She is very afraid of even the mildes incline. Swimming poses problem for dyspnea and cough. Never been to rehab due to daily swimming. Now worried that dyspnea/cough can impact desire to travel abroad. Also, seems  to be having non specific msk tupe right chest wall pain for many months.   Past, Family, Social reviewed: no change since last visit   OV 02/26/2015  Chief Complaint  Patient presents with  . Follow-up    Pt here after PFT and HRCT. Pt stated her breathing is better but her allergies are bothersome. Pt c/o prod cough with white mucus, sinus congestion, rhinorrhea. Pt stated her cough is better when she lays down.     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 AT that time big issue was severe cough which was felt largely cough hyperesnitvity due to fall 2015 viral infection. She says that slowly resolved. Wit that dyspnea improved too. Last week at [place] she climbed stairs etc., better than ever before. However, past week pollen level high and having cough again though not as bad. She is takng nasal steroid, OTC antihistamine x 2 and his helping. Dyspnea itself is stble. Cntinues pred 5mg  per day. Attendedd transplant lecture at [facility]. Planning [place] trip in summer 2016. 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 The chronic cough is resolved. [place] trip went well. Dyhspnea is mild , on exertion , relieved by rest and stable withoiut change. Continues prednisone 5mg  per day. She is is due for Hip surgery with Dr Alvan Dame in Oct 2017. She has lot of questions that she wants me to talk to DR Alvan Dame about. I spoke to him several days after this visit when he called and resolved questions. Overall low risk for pulmonary complications after hip surgery  Spiro FVC 1.8L and walking ddesat test - no desat in office  Suggests stability   Review of Systems  Constitutional: Negative for fever and unexpected weight change.  HENT: Negative for congestion, dental problem, ear pain, nosebleeds, postnasal drip, rhinorrhea, sinus pressure, sneezing, sore throat and trouble swallowing.   Eyes:  Negative for redness and itching.  Respiratory: Positive for cough. Negative for chest tightness,  shortness of breath and wheezing.   Cardiovascular: Negative for palpitations and leg swelling.  Gastrointestinal: Negative for nausea and vomiting.  Genitourinary: Negative for dysuria.  Musculoskeletal: Negative for joint swelling.  Skin: Negative for rash.  Neurological: Negative for headaches.  Hematological: Does not bruise/bleed easily.  Psychiatric/Behavioral: Negative for dysphoric mood. The patient is not nervous/anxious.        Objective:   Physical Exam  Constitutional: She is oriented to person, place, and time. She appears well-developed and well-nourished. No distress.  HENT:  Head: Normocephalic and atraumatic.  Right Ear: External ear normal.  Left Ear: External ear normal.  Mouth/Throat: Oropharynx is clear and moist. No oropharyngeal exudate.  Eyes: Conjunctivae and EOM are normal. Pupils are equal, round, and reactive to light. Right eye exhibits no discharge. Left eye exhibits no discharge. No scleral icterus.  Neck: Normal range of motion. Neck supple. No JVD present. No tracheal deviation present. No thyromegaly present.  Cardiovascular: Normal rate, regular rhythm, normal heart sounds and intact distal pulses.  Exam reveals no gallop and no friction rub.   No murmur heard. Pulmonary/Chest: Effort normal. No respiratory distress. She has no wheezes. She has rales. She exhibits no tenderness.  Abdominal: Soft. Bowel sounds are normal. She exhibits no distension and no mass. There is no tenderness. There is no rebound and no guarding.  Musculoskeletal: Normal range of motion. She exhibits no edema or tenderness.  Lymphadenopathy:    She has no cervical adenopathy.  Neurological: She is alert and oriented to person, place, and time. She has normal reflexes. No cranial nerve deficit. She exhibits normal muscle tone. Coordination normal.  Skin: Skin is warm and dry. No  rash noted. She is not diaphoretic. No erythema. No pallor.  Psychiatric: She has a normal mood and affect. Her behavior is normal. Judgment and thought content normal.  Vitals reviewed.   Filed Vitals:   07/08/15 1329  BP: 132/78  Pulse: 71  Height: 4\' 11"  (1.499 m)  Weight: 157 lb 12.8 oz (71.578 kg)  SpO2: 95%    Exam       Assessment & Plan:     ICD-9-CM ICD-10-CM   1. ILD (interstitial lung disease) 515 J84.9 Spirometry with Graph  2. Chronic cough 786.2 R05 CANCELED: Nitric oxide  3. Hypersensitivity pneumonitis 495.9 J67.9 CANCELED: Nitric oxide  4. Preop pulmonary/respiratory exam V72.82 Z01.811    #ILD due to HP  - appears stable objectively and subjectively  - glad you are doing well  - continue prednisone 5mg  per day    #Chronic cough  - glad cough is resolved  #Pulmonary Pre-OP Eval - low risk for pulmonary complications such as pneumonia, VDRF, ILD flare, PE following hip surgery  -Will d/w DR Alvan Dame about post op PE prophylaxis, need for MRI hip, post op PT and stress dose steroids  #Followup  -6 months or sooner - flu shot in fall  - walk test and office spirometry at followup - call or come sooner if cough or chest pain continue to be problems   Dr. Brand Males, M.D., [facility].C.P Pulmonary and Critical Care Medicine Staff Physician [place] [facility] Pager: [phone], If no answer or between  15:00h - 7:00h: call [phone]  08/04/2015 1:02 AM

## 2015-07-08 NOTE — Patient Instructions (Addendum)
ICD-9-CM ICD-10-CM   1. ILD (interstitial lung disease) 515 J84.9 Spirometry with Graph  2. Chronic cough 786.2 R05   3. Hypersensitivity pneumonitis 495.9 J67.9   4. 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

## 2015-07-09 ENCOUNTER — Telehealth: Payer: Self-pay | Admitting: Internal Medicine

## 2015-07-09 NOTE — Telephone Encounter (Signed)
Please get hold of Dr. Paralee Cancel [facility] orthopedic surgeon office [facility] orthopedics practice. You can give's office myself on and have him call me regarding Rhonda [name]

## 2015-07-09 NOTE — Telephone Encounter (Signed)
Called and spoke to [place] at Dr. Aurea Graff office at [phone]. MR's cell number given to [facility] to relay message to Dr. Alvan Dame to contact MR regarding pt. Anderson Malta gave Dr. Alvan Dame message. Nothing further needed.

## 2015-07-23 ENCOUNTER — Telehealth: Payer: Self-pay | Admitting: Internal Medicine

## 2015-07-23 NOTE — Telephone Encounter (Signed)
No need for pna vaccine. Also let her know that I d/w DR Adriana Mccallum and  i prefer to dsicuss directly with her

## 2015-07-23 NOTE — Telephone Encounter (Signed)
lmomtcb x1 

## 2015-07-23 NOTE — Telephone Encounter (Signed)
Called spoke with pt. She reports she received a message from [facility] stating she needs a PNA vaccine. According to her chart she has had both prevanr 13 and reg PNA vaccine last year. Please advise MR thanks

## 2015-07-24 NOTE — Telephone Encounter (Signed)
Called and spoke to pt. Informed her of the recs per MR. Pt verbalized understanding and denied any further questions or concerns at this time.   

## 2015-08-05 ENCOUNTER — Other Ambulatory Visit: Payer: Self-pay | Admitting: Physician Assistant

## 2015-08-21 ENCOUNTER — Encounter: Payer: Self-pay | Admitting: Family Medicine

## 2015-08-21 ENCOUNTER — Ambulatory Visit (INDEPENDENT_AMBULATORY_CARE_PROVIDER_SITE_OTHER): Payer: Medicare Other | Admitting: Family Medicine

## 2015-08-21 VITALS — BP 117/72 | HR 94 | Temp 98.4°F | Resp 16 | Ht 60.2 in | Wt 155.8 lb

## 2015-08-21 DIAGNOSIS — R7309 Other abnormal glucose: Secondary | ICD-10-CM

## 2015-08-21 DIAGNOSIS — E669 Obesity, unspecified: Secondary | ICD-10-CM | POA: Diagnosis not present

## 2015-08-21 DIAGNOSIS — R7303 Prediabetes: Secondary | ICD-10-CM

## 2015-08-21 NOTE — Patient Instructions (Signed)
Best of luck with your hip replacement.   I will be in touch with your A1c when it comes in I think the diet you are thinking of sounds reasonable, but let me know if you have any concerns about anything.

## 2015-08-21 NOTE — Progress Notes (Signed)
[facility] [address], [place] [address] [phone]  Date:  08/21/2015   Name:  Rhonda [name]   DOB:  [birth year]   MRN:  [id number]  PCP:  Ellsworth Lennox, MD    Chief Complaint: wants to discuss labs and hip surgery   History of Present Illness:  Rhonda [name] is a 64 y.o. very pleasant female patient who presents with the following:  She is having a right hip replacement on 10/18- she received some tramadol recently which is really helping with her pain. She is also using tylenol and aleve as needed Dr. Alvan Dame is doing her surgery.   Dr. Heron Nay did her pre-operative evaluation. She also has interstitial lung disease.  She has to take 5mg  of prednisone for this and will likely have to continue this "for life."  She would like to recheck her A1c- she had a value of 5.9% in March  She is thinking about doing the [employer] which is popular in our community.  Although I do not know the details about this diet, from what she descirbes this sounds like a reasonable plan and does not seem dangerous  Patient Active Problem List   Diagnosis Date Noted  . Preop pulmonary/respiratory exam 07/08/2015  . MVP (mitral valve prolapse)   . Pulmonary HTN   . Heart palpitations 02/28/2015  . Right-sided chest wall pain 12/19/2014  . Chronic cough 12/19/2014  . Wheezing 10/20/2014  . ILD (interstitial lung disease) 09/20/2014  . Upper respiratory infection 08/25/2014  . Irritable bowel syndrome (IBS) 02/08/2014  . Spontaneous pneumothorax- Right 08/08/2013  . High risk medication use 01/03/2013  . Osteopenia 10/28/2012  . Metabolic syndrome 94/70/9628  . 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 oil pain and ? mold in house 10/29/2011  . Snoring 10/29/2011  . Dyslipidemia 04/02/2010  . ABNORMAL EKG 04/02/2010  . DEPRESSION 03/27/2010  . MITRAL REGURGITATION 03/27/2010  . DYSPNEA ON EXERTION 03/27/2010    Past Medical History   Diagnosis Date  . High cholesterol   . History of chronic bronchitis     as child  . GERD (gastroesophageal reflux disease)   . Arthritis   . Depression   . Allergy   . Asthma   . Cataract   . Pneumonitis, hypersensitivity     a. 09/2012 s/p Bx - ? 2/2 bird, mold, oil paint exposure ->on steroids, followed by pulm.  . Rapid heart rate   . DOE (dyspnea on exertion)     a. 04/2010 Lexi MV EF 71%, no ischemia/infarct;    . Hyperplastic colon polyp 2007  . Hiatal hernia   . Interstitial lung disease   . Insulin resistance     past  . Inguinal hernia     right  . Pulmonary fibrosis   . Helicobacter pylori ab+   . Heart palpitations 02/28/2015  . Pulmonary HTN     mild PASP 39mmHg on echo 02/2015  . MVP (mitral valve prolapse)     Posterior mitral valve leaflet with mild MR    Past Surgical History  Procedure Laterality Date  . Tubal ligation    . Foot fracture surgery  2006 or 2007    right  . Video bronchoscopy  11/19/2011    Procedure: VIDEO BRONCHOSCOPY WITH FLUORO;  Surgeon: Brand Males, MD;  [facility] END[facility];  Service: Endoscopy;;  . Hymenectomy    . Cesarean section    . Video assisted thoracoscopy  09/28/2012  Procedure: VIDEO ASSISTED THORACOSCOPY;  Surgeon: Melrose Nakayama, MD;  Location: [facility];  Service: Thoracic;  Laterality: Right;  . Lung biopsy  09/28/2012    Procedure: LUNG BIOPSY;  Surgeon: Melrose Nakayama, MD;  Location: [facility];  Service: Thoracic;  Laterality: N/A;  lung biopsies tims three  . Breast biopsy Right 2009    BIOPSY    Social History  Substance Use Topics  . Smoking status: Never Smoker   . Smokeless tobacco: Never Used     Comment: pt states she experimented in college  . Alcohol Use: 1.1 oz/week    1 Glasses of wine, 1 Standard drinks or equivalent per week     Comment: once a wk. 1-2 glasses    Family History  Problem Relation Age of Onset  . Emphysema Maternal Grandmother   . Asthma Maternal Grandmother   .  Asthma Mother   . Osteoarthritis Mother   . Dementia Mother 68  . Lymphoma Father   . Diabetes Father   . Hypertension Sister   . Heart disease Sister   . Kidney disease Sister   . Lung disease Maternal Grandfather   . Breast cancer Paternal Aunt   . Ovarian cancer Maternal Aunt   . Bone cancer Paternal Grandfather   . Colon cancer Neg Hx     Allergies  Allergen Reactions  . Atorvastatin Other (See Comments)    Leg pain  . Betadine [Povidone Iodine] Other (See Comments)    blisters  . Codeine Nausea And Vomiting  . Rosuvastatin Other (See Comments)    Leg pain  . Shellfish Allergy Other (See Comments)    vomiting  . Sulfonamide Derivatives Other (See Comments)    headaches    Medication list has been reviewed and updated.  Current Outpatient Prescriptions on File Prior to Visit  Medication Sig Dispense Refill  . alendronate (FOSAMAX) 70 MG tablet Take 1 tablet (70 mg total) by mouth every 7 (seven) days. Take with a full glass of water on an empty stomach. 12 tablet 0  . Alpha-D-Galactosidase (BEANO PO) Take by mouth as needed.    . Alum Hydroxide-Mag Carbonate (GAVISCON PO) Take by mouth as needed.    . Ascorbic Acid (VITAMIN C) 1000 MG tablet Take 1,000 mg by mouth daily.    . calcium carbonate (TUMS - DOSED IN MG ELEMENTAL CALCIUM) 500 MG chewable tablet Chew 2 tablets by mouth daily.    . cetirizine (ZYRTEC) 10 MG tablet Take 1 tablet (10 mg total) by mouth daily. 90 tablet 3  . chlorpheniramine (CHLOR-TRIMETON) 4 MG tablet Take 4 mg by mouth daily as needed for allergies.     Marland Kitchen lactase (LACTAID) 3000 UNITS tablet Take by mouth as needed.    . metoprolol succinate (TOPROL-XL) 50 MG 24 hr tablet TAKE 1 TABLET BY MOUTH EVERY DAY IMMEDIATELY FOLLOWING A MEAL. 90 tablet 0  . Multiple Vitamins-Minerals (AIRBORNE PO) Take by mouth as needed.    Marland Kitchen omeprazole (PRILOSEC) 20 MG capsule Take 1 capsule (20 mg total) by mouth daily. 30 capsule 5  . pravastatin (PRAVACHOL) 40 MG  tablet Take 0.5 tablets (20 mg total) by mouth daily. 135 tablet 0  . predniSONE (DELTASONE) 5 MG tablet Take 1 tablet (5 mg total) by mouth daily with breakfast. 90 tablet 2  . Probiotic Product (PROBIOTIC DAILY PO) Take by mouth. Take 2 gummies daily    . scopolamine (TRANSDERM-SCOP, 1.5 MG,) 1 MG/3DAYS Apply patch every 3 days as needed  for dizziness 4 patch 0   No current facility-administered medications on file prior to visit.    Review of Systems:  As per HPI- otherwise negative.   Physical Examination: Filed Vitals:   08/21/15 1518  BP: 117/72  Pulse: 94  Temp: 98.4 F (36.9 C)  Resp: 16   Filed Vitals:   08/21/15 1518  Height: 5' 0.2" (1.529 m)  Weight: 155 lb 12.8 oz (70.67 kg)   Body mass index is 30.23 kg/(m^2). Ideal Body Weight: Weight in (lb) to have BMI = 25: 128.6  GEN: WDWN, NAD, Non-toxic, A & O x 3, obese, looks well HEENT: Atraumatic, Normocephalic. Neck supple. No masses, No LAD. Ears and Nose: No external deformity. CV: RRR, No M/G/R. No JVD. No thrill. No extra heart sounds. PULM: CTA B, no wheezes, crackles, rhonchi. No retractions. No resp. distress. No accessory muscle use. EXTR: No c/c/e NEURO Normal gait.  PSYCH: Normally interactive. Conversant. Not depressed or anxious appearing.  Calm demeanor.   Assessment and Plan: Borderline diabetes - Plan: Hemoglobin A1c  Obesity check A1c today as she had borderline DM in the past Hopefully her hip replacement will allow her to be more active and to have less pain She plans to try a diet system after her surgery which sounds reasonable- if she has any safety concerns she is welcome to ask me     Signed Lamar Blinks, MD

## 2015-08-22 ENCOUNTER — Encounter: Payer: Self-pay | Admitting: Family Medicine

## 2015-08-22 DIAGNOSIS — R7303 Prediabetes: Secondary | ICD-10-CM | POA: Insufficient documentation

## 2015-08-22 LAB — HEMOGLOBIN A1C
Hgb A1c MFr Bld: 6.1 % — ABNORMAL HIGH (ref <5.7)
Mean Plasma Glucose: 128 mg/dL — ABNORMAL HIGH (ref <117)

## 2015-08-28 ENCOUNTER — Telehealth: Payer: Self-pay

## 2015-08-28 MED ORDER — METOPROLOL SUCCINATE ER 50 MG PO TB24: ORAL_TABLET | ORAL | Status: DC

## 2015-08-28 NOTE — Telephone Encounter (Signed)
Pt is in [place] and is out of metopril and is needing to get at least 5 til she is able to get home and call it in to [place]   Please call

## 2015-08-28 NOTE — Telephone Encounter (Signed)
Rx sent in. Pt notified. 

## 2015-09-03 ENCOUNTER — Encounter (HOSPITAL_COMMUNITY): Payer: Self-pay

## 2015-09-03 ENCOUNTER — Encounter (HOSPITAL_COMMUNITY)
Admission: RE | Admit: 2015-09-03 | Discharge: 2015-09-03 | Disposition: A | Discharge status: Home / Self Care | Payer: Medicare Other | Source: Ambulatory Visit | Attending: Orthopedic Surgery | Admitting: Orthopedic Surgery

## 2015-09-03 DIAGNOSIS — Z01818 Encounter for other preprocedural examination: Secondary | ICD-10-CM | POA: Diagnosis not present

## 2015-09-03 DIAGNOSIS — M1611 Unilateral primary osteoarthritis, right hip: Secondary | ICD-10-CM | POA: Insufficient documentation

## 2015-09-03 HISTORY — DX: Personal history of systemic steroid therapy: Z92.241

## 2015-09-03 LAB — BASIC METABOLIC PANEL
Anion gap: 6 (ref 5–15)
BUN: 26 mg/dL — ABNORMAL HIGH (ref 6–20)
CALCIUM: 9.6 mg/dL (ref 8.9–10.3)
CHLORIDE: 104 mmol/L (ref 101–111)
CO2: 29 mmol/L (ref 22–32)
CREATININE: 0.93 mg/dL (ref 0.44–1.00)
GFR calc non Af Amer: >60 mL/min (ref >60)
GLUCOSE: 105 mg/dL — AB (ref 65–99)
Potassium: 4.5 mmol/L (ref 3.5–5.1)
Sodium: 139 mmol/L (ref 135–145)

## 2015-09-03 LAB — URINALYSIS, ROUTINE W REFLEX MICROSCOPIC
BILIRUBIN URINE: NEGATIVE
Glucose, UA: NEGATIVE mg/dL
Hgb urine dipstick: NEGATIVE
KETONES UR: NEGATIVE mg/dL
NITRITE: NEGATIVE
Protein, ur: NEGATIVE mg/dL
SPECIFIC GRAVITY, URINE: 1.028 (ref 1.005–1.030)
UROBILINOGEN UA: 0.2 mg/dL (ref 0.0–1.0)
pH: 5 (ref 5.0–8.0)

## 2015-09-03 LAB — SURGICAL PCR SCREEN
MRSA, PCR: NEGATIVE
STAPHYLOCOCCUS AUREUS: NEGATIVE

## 2015-09-03 LAB — CBC
HCT: 45.8 % (ref 36.0–46.0)
Hemoglobin: 14.8 g/dL (ref 12.0–15.0)
MCH: 30.6 pg (ref 26.0–34.0)
MCHC: 32.3 g/dL (ref 30.0–36.0)
MCV: 94.6 fL (ref 78.0–100.0)
PLATELETS: 207 10*3/uL (ref 150–400)
RBC: 4.84 MIL/uL (ref 3.87–5.11)
RDW: 13.6 % (ref 11.5–15.5)
WBC: 13.9 10*3/uL — ABNORMAL HIGH (ref 4.0–10.5)

## 2015-09-03 LAB — URINE MICROSCOPIC-ADD ON

## 2015-09-03 LAB — PROTIME-INR
INR: 1.08 (ref 0.00–1.49)
Prothrombin Time: 14.2 seconds (ref 11.6–15.2)

## 2015-09-03 LAB — APTT: aPTT: 27 seconds (ref 24–37)

## 2015-09-03 LAB — ABO/RH: ABO/RH(D): A NEG

## 2015-09-03 NOTE — Pre-Procedure Instructions (Addendum)
EKG 3'16, Echo 4'16, Stress 4'16,CXR 12'15, CT Chest 4'16, PFT 4'16 Epic. Clearance note (Dr. Radford Pax 07-25-15 with chart. 09-03-15 1600 Note per Epic to [phone] Dr. Aurea Graff office to note urinalysis and BMP results.

## 2015-09-03 NOTE — Progress Notes (Signed)
09-03-15 Labs viewable in Epic, note urinalysis, and BMP.

## 2015-09-03 NOTE — Patient Instructions (Addendum)
18 Rhonda [name]  09/03/2015   Your procedure is scheduled on:   09-10-2015 Tuesday  Enter through [facility]  Entrance and follow signs to [facility]. Arrive at    0515    AM   (Limit 1 person with you).  Call this number if you have problems the morning of surgery: [phone]  Or Presurgical Testing [phone].   For Living Will and/or Health Care Power Attorney Forms: please provide copy for your medical record,may bring AM of surgery(Forms should be already notarized -we do not provide this service).(10-11-16Yes/ will bring information on day of surgery).     Do not eat food/ or drink: After Midnight.      Take these medicines the morning of surgery with A SIP OF WATER-   (DO NOT TAKE ANY DIABETIC MEDS AM OF SURGERY) : Certirizine. Prilosec. Prednisone.    Do not wear jewelry, make-up or nail polish.  Do not wear deodorant, lotions, powders, or perfumes.   Do not shave legs and under arms- 48 hours(2 days) prior to first CHG shower.(Shaving face and neck okay.)  Do not bring valuables to the hospital.(Hospital is not responsible for lost valuables).  Contacts, dentures or removable bridgework, body piercing, hair pins may not be worn into surgery.  Leave suitcase in the car. After surgery it may be brought to your room.  For patients admitted to the hospital, checkout time is 11:00 AM the day of discharge.(Restricted visitors-Any Persons displaying flu-like symptoms or illness).    Patients discharged the day of surgery will not be allowed to drive home. Must have responsible person with you x 24 hours once discharged.  Name and phone number of your driver: Jerlyn Ly, spouse [phone] cell     Please read over the following fact sheets that you were given:  CHG(Chlorhexidine Gluconate 4% Surgical Soap) use, MRSA Information, Blood Transfusion fact sheet, Incentive Spirometry Instruction.  Remember : Type/Screen "Blue armbands" - may not be removed once  applied(would result in being retested AM of surgery, if removed).         [facility] - Preparing for Surgery Before surgery, you can play an important role.  Because skin is not sterile, your skin needs to be as free of germs as possible.  You can reduce the number of germs on your skin by washing with CHG (chlorahexidine gluconate) soap before surgery.  CHG is an antiseptic cleaner which kills germs and bonds with the skin to continue killing germs even after washing. Please DO NOT use if you have an allergy to CHG or antibacterial soaps.  If your skin becomes reddened/irritated stop using the CHG and inform your nurse when you arrive at Short Stay. Do not shave (including legs and underarms) for at least 48 hours prior to the first CHG shower.  You may shave your face/neck. Please follow these instructions carefully:  1.  Shower with CHG Soap the night before surgery and the  morning of Surgery.  2.  If you choose to wash your hair, wash your hair first as usual with your  normal  shampoo.  3.  After you shampoo, rinse your hair and body thoroughly to remove the  shampoo.                           4.  Use CHG as you would any other liquid soap.  You can apply chg directly  to the skin and  wash                       Gently with a scrungie or clean washcloth.  5.  Apply the CHG Soap to your body ONLY FROM THE NECK DOWN.   Do not use on face/ open                           Wound or open sores. Avoid contact with eyes, ears mouth and genitals (private parts).                       Wash face,  Genitals (private parts) with your normal soap.             6.  Wash thoroughly, paying special attention to the area where your surgery  will be performed.  7.  Thoroughly rinse your body with warm water from the neck down.  8.  DO NOT shower/wash with your normal soap after using and rinsing off  the CHG Soap.                9.  Pat yourself dry with a clean towel.            10.  Wear clean pajamas.             11.  Place clean sheets on your bed the night of your first shower and do not  sleep with pets. Day of Surgery : Do not apply any lotions/deodorants the morning of surgery.  Please wear clean clothes to the hospital/surgery center.  FAILURE TO FOLLOW THESE INSTRUCTIONS MAY RESULT IN THE CANCELLATION OF YOUR SURGERY PATIENT SIGNATURE_________________________________  NURSE SIGNATURE__________________________________  ________________________________________________________________________   Adam Phenix  An incentive spirometer is a tool that can help keep your lungs clear and active. This tool measures how well you are filling your lungs with each breath. Taking long deep breaths may help reverse or decrease the chance of developing breathing (pulmonary) problems (especially infection) following:  A long period of time when you are unable to move or be active. BEFORE THE PROCEDURE   If the spirometer includes an indicator to show your best effort, your nurse or respiratory therapist will set it to a desired goal.  If possible, sit up straight or lean slightly forward. Try not to slouch.  Hold the incentive spirometer in an upright position. INSTRUCTIONS FOR USE   Sit on the edge of your bed if possible, or sit up as far as you can in bed or on a chair.  Hold the incentive spirometer in an upright position.  Breathe out normally.  Place the mouthpiece in your mouth and seal your lips tightly around it.  Breathe in slowly and as deeply as possible, raising the piston or the ball toward the top of the column.  Hold your breath for 3-5 seconds or for as long as possible. Allow the piston or ball to fall to the bottom of the column.  Remove the mouthpiece from your mouth and breathe out normally.  Rest for a few seconds and repeat Steps 1 through 7 at least 10 times every 1-2 hours when you are awake. Take your time and take a few normal breaths between deep  breaths.  The spirometer may include an indicator to show your best effort. Use the indicator as a goal to work toward during each repetition.  After each set of 10  deep breaths, practice coughing to be sure your lungs are clear. If you have an incision (the cut made at the time of surgery), support your incision when coughing by placing a pillow or rolled up towels firmly against it. Once you are able to get out of bed, walk around indoors and cough well. You may stop using the incentive spirometer when instructed by your caregiver.  RISKS AND COMPLICATIONS  Take your time so you do not get dizzy or light-headed.  If you are in pain, you may need to take or ask for pain medication before doing incentive spirometry. It is harder to take a deep breath if you are having pain. AFTER USE  Rest and breathe slowly and easily.  It can be helpful to keep track of a log of your progress. Your caregiver can provide you with a simple table to help with this. If you are using the spirometer at home, follow these instructions: [place] IF:   You are having difficultly using the spirometer.  You have trouble using the spirometer as often as instructed.  Your pain medication is not giving enough relief while using the spirometer.  You develop fever of 100.5 F (38.1 C) or higher. SEEK IMMEDIATE MEDICAL CARE IF:   You cough up bloody sputum that had not been present before.  You develop fever of 102 F (38.9 C) or greater.  You develop worsening pain at or near the incision site. MAKE SURE YOU:   Understand these instructions.  Will watch your condition.  Will get help right away if you are not doing well or get worse. Document Released: 03/22/2007 Document Revised: 02/01/2012 Document Reviewed: 05/23/2007 ExitCare Patient Information 2014 ExitCare, [place].   ________________________________________________________________________  WHAT IS A BLOOD TRANSFUSION? Blood  Transfusion Information  A transfusion is the replacement of blood or some of its parts. Blood is made up of multiple cells which provide different functions.  Red blood cells carry oxygen and are used for blood loss replacement.  White blood cells fight against infection.  Platelets control bleeding.  Plasma helps clot blood.  Other blood products are available for specialized needs, such as hemophilia or other clotting disorders. BEFORE THE TRANSFUSION  Who gives blood for transfusions?   Healthy volunteers who are fully evaluated to make sure their blood is safe. This is blood bank blood. Transfusion therapy is the safest it has ever been in the practice of medicine. Before blood is taken from a donor, a complete history is taken to make sure that person has no history of diseases nor engages in risky social behavior (examples are intravenous drug use or sexual activity with multiple partners). The donor's travel history is screened to minimize risk of transmitting infections, such as malaria. The donated blood is tested for signs of infectious diseases, such as HIV and hepatitis. The blood is then tested to be sure it is compatible with you in order to minimize the chance of a transfusion reaction. If you or a relative donates blood, this is often done in anticipation of surgery and is not appropriate for emergency situations. It takes many days to process the donated blood. RISKS AND COMPLICATIONS Although transfusion therapy is very safe and saves many lives, the main dangers of transfusion include:   Getting an infectious disease.  Developing a transfusion reaction. This is an allergic reaction to something in the blood you were given. Every precaution is taken to prevent this. The decision to have a blood transfusion has  been considered carefully by your caregiver before blood is given. Blood is not given unless the benefits outweigh the risks. AFTER THE TRANSFUSION  Right after  receiving a blood transfusion, you will usually feel much better and more energetic. This is especially true if your red blood cells have gotten low (anemic). The transfusion raises the level of the red blood cells which carry oxygen, and this usually causes an energy increase.  The nurse administering the transfusion will monitor you carefully for complications. HOME CARE INSTRUCTIONS  No special instructions are needed after a transfusion. You may find your energy is better. Speak with your caregiver about any limitations on activity for underlying diseases you may have. SEEK MEDICAL CARE IF:   Your condition is not improving after your transfusion.  You develop redness or irritation at the intravenous (IV) site. SEEK IMMEDIATE MEDICAL CARE IF:  Any of the following symptoms occur over the next 12 hours:  Shaking chills.  You have a temperature by mouth above 102 F (38.9 C), not controlled by medicine.  Chest, back, or muscle pain.  People around you feel you are not acting correctly or are confused.  Shortness of breath or difficulty breathing.  Dizziness and fainting.  You get a rash or develop hives.  You have a decrease in urine output.  Your urine turns a dark color or changes to pink, red, or brown. Any of the following symptoms occur over the next 10 days:  You have a temperature by mouth above 102 F (38.9 C), not controlled by medicine.  Shortness of breath.  Weakness after normal activity.  The white part of the eye turns yellow (jaundice).  You have a decrease in the amount of urine or are urinating less often.  Your urine turns a dark color or changes to pink, red, or brown. Document Released: 11/06/2000 Document Revised: 02/01/2012 Document Reviewed: 06/25/2008 [facility] Patient Information 2014 [place], [place].  _______________________________________________________________________

## 2015-09-04 ENCOUNTER — Ambulatory Visit: Payer: Medicare Other | Admitting: Family Medicine

## 2015-09-06 NOTE — H&P (Signed)
TOTAL HIP ADMISSION H&P  Patient is admitted for right total hip arthroplasty, anterior approach.  Subjective:  Chief Complaint: Right hip primary OA / pain  HPI: Rhonda [name], 64 y.o. female, has a history of pain and functional disability in the right hip(s) due to arthritis and patient has failed non-surgical conservative treatments for greater than 12 weeks to include NSAID's and/or analgesics, corticosteriod injections and activity modification.  Onset of symptoms was gradual starting  years ago with gradually worsening course since that time.The patient noted no past surgery on the right hip(s).  Patient currently rates pain in the right hip at 10 out of 10 with activity. Patient has night pain, worsening of pain with activity and weight bearing, trendelenberg gait, pain that interfers with activities of daily living and pain with passive range of motion. Patient has evidence of periarticular osteophytes and joint space narrowing by imaging studies. This condition presents safety issues increasing the risk of falls.  There is no current active infection.  Risks, benefits and expectations were discussed with the patient.  Risks including but not limited to the risk of anesthesia, blood clots, nerve damage, blood vessel damage, failure of the prosthesis, infection and up to and including death.  Patient understand the risks, benefits and expectations and wishes to proceed with surgery.   PCP: Lamar Blinks, MD  D/C Plans:      Home with HHPT  Post-op Meds:       No Rx given  Tranexamic Acid:      To be given - IV   Decadron:      Is to be given  FYI:     ASA post-op  Tramadol (Dilaudid if needed)  No DME needs  No HHPT    Patient Active Problem List   Diagnosis Date Noted  . Pre-diabetes 08/22/2015  . Preop pulmonary/respiratory exam 07/08/2015  . MVP (mitral valve prolapse)   . Pulmonary HTN ([place])   . Heart palpitations 02/28/2015  . Right-sided chest wall pain 12/19/2014   . Chronic cough 12/19/2014  . Wheezing 10/20/2014  . ILD (interstitial lung disease) ([place]) 09/20/2014  . Upper respiratory infection 08/25/2014  . Irritable bowel syndrome (IBS) 02/08/2014  . Spontaneous pneumothorax- Right 08/08/2013  . High risk medication use 01/03/2013  . Osteopenia 10/28/2012  . Metabolic syndrome 08/65/7846  . 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 oil pain and ? mold in house 10/29/2011  . Snoring 10/29/2011  . Dyslipidemia 04/02/2010  . ABNORMAL EKG 04/02/2010  . DEPRESSION 03/27/2010  . MITRAL REGURGITATION 03/27/2010  . DYSPNEA ON EXERTION 03/27/2010   Past Medical History  Diagnosis Date  . High cholesterol   . History of chronic bronchitis     as child  . GERD (gastroesophageal reflux disease)   . Depression   . Allergy   . Asthma   . Cataract   . Pneumonitis, hypersensitivity ([place])     a. 09/2012 s/p Bx - ? 2/2 bird, mold, oil paint exposure ->on steroids, followed by pulm.  . Rapid heart rate     Dr. Radford Pax follows- last visit Epic note 9'16  . DOE (dyspnea on exertion)     a. 04/2010 Lexi MV EF 71%, no ischemia/infarct;    . Hyperplastic colon polyp 2007  . Hiatal hernia   . Interstitial lung disease ([place])   . Insulin resistance     past  . Inguinal hernia     right  . Pulmonary fibrosis ([facility])     Dr.  Ramaswamy follows- stable at present  . Helicobacter pylori ab+   . Heart palpitations 02/28/2015  . Pulmonary HTN ([place])     mild PASP 1mmHg on echo 02/2015  . MVP (mitral valve prolapse)     Posterior mitral valve leaflet with mild MR  . Arthritis     history spinal stenosis. osteoarthritis right hip  . H/O steroid therapy     Steroid use orally over 4 yrs- for Lung Fibrosis    Past Surgical History  Procedure Laterality Date  . Tubal ligation    . Foot fracture surgery  2006 or 2007    right  . Video bronchoscopy  11/19/2011    Procedure: VIDEO BRONCHOSCOPY WITH FLUORO;  Surgeon: Brand Males, MD;  Location: [facility]  ENDOSCOPY;  Service: Endoscopy;;  . Hymenectomy    . Cesarean section    . Video assisted thoracoscopy  09/28/2012    Procedure: VIDEO ASSISTED THORACOSCOPY;  Surgeon: Melrose Nakayama, MD;  Location: [facility];  Service: Thoracic;  Laterality: Right;  . Lung biopsy  09/28/2012    Procedure: LUNG BIOPSY;  Surgeon: Melrose Nakayama, MD;  Location: [facility];  Service: Thoracic;  Laterality: N/A;  lung biopsies tims three  . Breast biopsy Right 2009    BIOPSY  . Cataract extraction Left     No prescriptions prior to admission   Allergies  Allergen Reactions  . Atorvastatin Other (See Comments)    Leg pain  . Betadine [Povidone Iodine] Other (See Comments)    blisters  . Codeine Nausea And Vomiting  . Rosuvastatin Other (See Comments)    Leg pain  . Shellfish Allergy Other (See Comments)    vomiting  . Sulfonamide Derivatives Other (See Comments)    headaches    Social History  Substance Use Topics  . Smoking status: Never Smoker   . Smokeless tobacco: Never Used     Comment: pt states she experimented in college  . Alcohol Use: 1.1 oz/week    1 Glasses of wine, 1 Standard drinks or equivalent per week     Comment: once a wk. 1-2 glasses    Family History  Problem Relation Age of Onset  . Emphysema Maternal Grandmother   . Asthma Maternal Grandmother   . Asthma Mother   . Osteoarthritis Mother   . Dementia Mother 88  . Lymphoma Father   . Diabetes Father   . Hypertension Sister   . Heart disease Sister   . Kidney disease Sister   . Lung disease Maternal Grandfather   . Breast cancer Paternal Aunt   . Ovarian cancer Maternal Aunt   . Bone cancer Paternal Grandfather   . Colon cancer Neg Hx      Review of Systems  Constitutional: Negative.   HENT: Negative.   Eyes: Negative.   Respiratory: Negative.   Cardiovascular: Negative.   Gastrointestinal: Positive for heartburn.  Genitourinary: Negative.   Skin: Negative.   Neurological: Negative.    Endo/Heme/Allergies: Positive for environmental allergies.  Psychiatric/Behavioral: Positive for depression.    Objective:  Physical Exam  Constitutional: She is oriented to person, place, and time. She appears well-developed and well-nourished.  HENT:  Head: Normocephalic.  Eyes: Pupils are equal, round, and reactive to light.  Neck: Neck supple. No JVD present. No tracheal deviation present. No thyromegaly present.  Cardiovascular: Normal rate, regular rhythm and intact distal pulses.   Respiratory: Effort normal and breath sounds normal. No stridor. No respiratory distress. She has no wheezes.  GI: Soft. There is no tenderness. There is no guarding.  Musculoskeletal:       Right hip: She exhibits decreased range of motion, decreased strength, tenderness and bony tenderness. She exhibits no swelling, no deformity and no laceration.  Lymphadenopathy:    She has no cervical adenopathy.  Neurological: She is alert and oriented to person, place, and time.  Skin: Skin is warm and dry.  Psychiatric: She has a normal mood and affect.      Labs:  Estimated body mass index is 31.85 kg/(m^2) as calculated from the following:   Height as of 07/08/15: 4\' 11"  (1.499 m).   Weight as of 07/08/15: 71.578 kg (157 lb 12.8 oz).   Imaging Review Plain radiographs demonstrate severe degenerative joint disease of the right hip(s). The bone quality appears to be good for age and reported activity level.  Assessment/Plan:  End stage arthritis, right hip(s)  The patient history, physical examination, clinical judgement of the provider and imaging studies are consistent with end stage degenerative joint disease of the right hip(s) and total hip arthroplasty is deemed medically necessary. The treatment options including medical management, injection therapy, arthroscopy and arthroplasty were discussed at length. The risks and benefits of total hip arthroplasty were presented and reviewed. The risks  due to aseptic loosening, infection, stiffness, dislocation/subluxation,  thromboembolic complications and other imponderables were discussed.  The patient acknowledged the explanation, agreed to proceed with the plan and consent was signed. Patient is being admitted for inpatient treatment for surgery, pain control, PT, OT, prophylactic antibiotics, VTE prophylaxis, progressive ambulation and ADL's and discharge planning.The patient is planning to be discharged home with home health services.       West Pugh Marialuisa Basara   PA-C  09/06/2015, 8:38 PM

## 2015-09-10 ENCOUNTER — Inpatient Hospital Stay (HOSPITAL_COMMUNITY): Payer: Medicare Other | Admitting: Certified Registered Nurse Anesthetist

## 2015-09-10 ENCOUNTER — Inpatient Hospital Stay (HOSPITAL_COMMUNITY): Payer: Medicare Other

## 2015-09-10 ENCOUNTER — Encounter (HOSPITAL_COMMUNITY)
Admission: RE | Disposition: A | Discharge status: Home Health | Payer: Self-pay | Source: Ambulatory Visit | Attending: Orthopedic Surgery

## 2015-09-10 ENCOUNTER — Encounter (HOSPITAL_COMMUNITY): Payer: Self-pay | Admitting: *Deleted

## 2015-09-10 ENCOUNTER — Inpatient Hospital Stay (HOSPITAL_COMMUNITY)
Admission: RE | Admit: 2015-09-10 | Discharge: 2015-09-12 | DRG: 470 | Disposition: A | Discharge status: Home Health | Payer: Medicare Other | Source: Ambulatory Visit | Attending: Orthopedic Surgery | Admitting: Orthopedic Surgery

## 2015-09-10 DIAGNOSIS — I341 Nonrheumatic mitral (valve) prolapse: Secondary | ICD-10-CM | POA: Diagnosis present

## 2015-09-10 DIAGNOSIS — I272 Other secondary pulmonary hypertension: Secondary | ICD-10-CM | POA: Diagnosis present

## 2015-09-10 DIAGNOSIS — R7303 Prediabetes: Secondary | ICD-10-CM | POA: Diagnosis present

## 2015-09-10 DIAGNOSIS — R42 Dizziness and giddiness: Secondary | ICD-10-CM | POA: Diagnosis present

## 2015-09-10 DIAGNOSIS — Z6830 Body mass index (BMI) 30.0-30.9, adult: Secondary | ICD-10-CM | POA: Diagnosis not present

## 2015-09-10 DIAGNOSIS — K449 Diaphragmatic hernia without obstruction or gangrene: Secondary | ICD-10-CM | POA: Diagnosis present

## 2015-09-10 DIAGNOSIS — Z01812 Encounter for preprocedural laboratory examination: Secondary | ICD-10-CM | POA: Diagnosis not present

## 2015-09-10 DIAGNOSIS — K219 Gastro-esophageal reflux disease without esophagitis: Secondary | ICD-10-CM | POA: Diagnosis present

## 2015-09-10 DIAGNOSIS — E669 Obesity, unspecified: Secondary | ICD-10-CM | POA: Diagnosis present

## 2015-09-10 DIAGNOSIS — M1611 Unilateral primary osteoarthritis, right hip: Secondary | ICD-10-CM | POA: Diagnosis present

## 2015-09-10 DIAGNOSIS — Z96649 Presence of unspecified artificial hip joint: Secondary | ICD-10-CM

## 2015-09-10 DIAGNOSIS — M25551 Pain in right hip: Secondary | ICD-10-CM | POA: Diagnosis present

## 2015-09-10 HISTORY — PX: TOTAL HIP ARTHROPLASTY: SHX124

## 2015-09-10 LAB — TYPE AND SCREEN
ABO/RH(D): A NEG
ANTIBODY SCREEN: NEGATIVE

## 2015-09-10 SURGERY — ARTHROPLASTY, HIP, TOTAL, ANTERIOR APPROACH
Anesthesia: Spinal | Site: Hip | Laterality: Right

## 2015-09-10 MED ORDER — DICYCLOMINE HCL 10 MG PO CAPS
10.0000 mg | ORAL_CAPSULE | Freq: Three times a day (TID) | ORAL | Status: DC | PRN
  Filled 2015-09-10: qty 1

## 2015-09-10 MED ORDER — LIDOCAINE HCL (CARDIAC) 20 MG/ML IV SOLN
INTRAVENOUS | Status: DC | PRN
  Administered 2015-09-10: 50 mg via INTRAVENOUS

## 2015-09-10 MED ORDER — ONDANSETRON HCL 4 MG PO TABS: 4.0000 mg | ORAL_TABLET | Freq: Four times a day (QID) | ORAL | Status: DC | PRN

## 2015-09-10 MED ORDER — METOCLOPRAMIDE HCL 5 MG/ML IJ SOLN
5.0000 mg | Freq: Three times a day (TID) | INTRAMUSCULAR | Status: DC | PRN
  Administered 2015-09-11: 10 mg via INTRAVENOUS
  Filled 2015-09-10: qty 2

## 2015-09-10 MED ORDER — PROPOFOL 10 MG/ML IV BOLUS
INTRAVENOUS | Status: AC
  Filled 2015-09-10: qty 20

## 2015-09-10 MED ORDER — METOCLOPRAMIDE HCL 10 MG PO TABS
5.0000 mg | ORAL_TABLET | Freq: Three times a day (TID) | ORAL | Status: DC | PRN
Start: 2015-09-10 — End: 2015-09-12

## 2015-09-10 MED ORDER — ONDANSETRON HCL 4 MG/2ML IJ SOLN
INTRAMUSCULAR | Status: DC | PRN
  Administered 2015-09-10: 4 mg via INTRAVENOUS

## 2015-09-10 MED ORDER — OMEPRAZOLE 20 MG PO CPDR
20.0000 mg | DELAYED_RELEASE_CAPSULE | Freq: Every day | ORAL | Status: DC
  Administered 2015-09-11 – 2015-09-12 (×2): 20 mg via ORAL
  Filled 2015-09-10 (×3): qty 1

## 2015-09-10 MED ORDER — CEFAZOLIN SODIUM-DEXTROSE 2-3 GM-% IV SOLR
2.0000 g | Freq: Four times a day (QID) | INTRAVENOUS | Status: AC
  Administered 2015-09-10 (×2): 2 g via INTRAVENOUS
  Filled 2015-09-10 (×3): qty 50

## 2015-09-10 MED ORDER — PREDNISONE 5 MG PO TABS
5.0000 mg | ORAL_TABLET | Freq: Every day | ORAL | Status: DC
  Administered 2015-09-11 – 2015-09-12 (×2): 5 mg via ORAL
  Filled 2015-09-10 (×3): qty 1

## 2015-09-10 MED ORDER — HYDROMORPHONE HCL 1 MG/ML IJ SOLN
0.5000 mg | INTRAMUSCULAR | Status: DC | PRN
  Administered 2015-09-10 – 2015-09-11 (×3): 1 mg via INTRAVENOUS
  Administered 2015-09-12: 0.5 mg via INTRAVENOUS
  Filled 2015-09-10 (×4): qty 1

## 2015-09-10 MED ORDER — MENTHOL 3 MG MT LOZG: 1.0000 | LOZENGE | OROMUCOSAL | Status: DC | PRN

## 2015-09-10 MED ORDER — FENTANYL CITRATE (PF) 100 MCG/2ML IJ SOLN
25.0000 ug | INTRAMUSCULAR | Status: DC | PRN
  Administered 2015-09-10 (×2): 50 ug via INTRAVENOUS

## 2015-09-10 MED ORDER — METHOCARBAMOL 500 MG PO TABS
500.0000 mg | ORAL_TABLET | Freq: Four times a day (QID) | ORAL | Status: DC | PRN
  Administered 2015-09-10 – 2015-09-12 (×6): 500 mg via ORAL
  Filled 2015-09-10 (×6): qty 1

## 2015-09-10 MED ORDER — ONDANSETRON HCL 4 MG/2ML IJ SOLN
4.0000 mg | Freq: Four times a day (QID) | INTRAMUSCULAR | Status: DC | PRN
  Administered 2015-09-10 – 2015-09-11 (×2): 4 mg via INTRAVENOUS
  Filled 2015-09-10 (×2): qty 2

## 2015-09-10 MED ORDER — FENTANYL CITRATE (PF) 100 MCG/2ML IJ SOLN
INTRAMUSCULAR | Status: AC
  Filled 2015-09-10: qty 2

## 2015-09-10 MED ORDER — MEPERIDINE HCL 50 MG/ML IJ SOLN: 6.2500 mg | INTRAMUSCULAR | Status: DC | PRN

## 2015-09-10 MED ORDER — PRAVASTATIN SODIUM 20 MG PO TABS
20.0000 mg | ORAL_TABLET | Freq: Every day | ORAL | Status: DC
  Administered 2015-09-10 – 2015-09-11 (×2): 20 mg via ORAL
  Filled 2015-09-10 (×3): qty 1

## 2015-09-10 MED ORDER — FENTANYL CITRATE (PF) 100 MCG/2ML IJ SOLN
INTRAMUSCULAR | Status: DC | PRN
  Administered 2015-09-10: 100 ug via INTRAVENOUS

## 2015-09-10 MED ORDER — NON FORMULARY
20.0000 mg | Freq: Every day | Status: DC
Start: 2015-09-11 — End: 2015-09-10

## 2015-09-10 MED ORDER — FLUTICASONE PROPIONATE 50 MCG/ACT NA SUSP
1.0000 | Freq: Every day | NASAL | Status: DC
  Administered 2015-09-12: 1 via NASAL
  Filled 2015-09-10: qty 16

## 2015-09-10 MED ORDER — PHENOL 1.4 % MT LIQD: 1.0000 | OROMUCOSAL | Status: DC | PRN

## 2015-09-10 MED ORDER — ASPIRIN EC 325 MG PO TBEC
325.0000 mg | DELAYED_RELEASE_TABLET | Freq: Two times a day (BID) | ORAL | Status: DC
  Administered 2015-09-11 – 2015-09-12 (×3): 325 mg via ORAL
  Filled 2015-09-10 (×5): qty 1

## 2015-09-10 MED ORDER — FENTANYL CITRATE (PF) 100 MCG/2ML IJ SOLN
INTRAMUSCULAR | Status: AC
Start: 2015-09-10 — End: 2015-09-10
  Filled 2015-09-10: qty 4

## 2015-09-10 MED ORDER — SODIUM CHLORIDE 0.9 % IV SOLN
1000.0000 mg | Freq: Once | INTRAVENOUS | Status: AC
  Administered 2015-09-10: 1000 mg via INTRAVENOUS
  Filled 2015-09-10: qty 10

## 2015-09-10 MED ORDER — LACTATED RINGERS IV SOLN: INTRAVENOUS | Status: DC

## 2015-09-10 MED ORDER — HYDROMORPHONE HCL 2 MG PO TABS
2.0000 mg | ORAL_TABLET | ORAL | Status: DC | PRN
  Administered 2015-09-10 (×2): 4 mg via ORAL
  Administered 2015-09-10 (×2): 2 mg via ORAL
  Administered 2015-09-11 (×2): 4 mg via ORAL
  Filled 2015-09-10: qty 1
  Filled 2015-09-10 (×4): qty 2
  Filled 2015-09-10: qty 1

## 2015-09-10 MED ORDER — HYDROMORPHONE HCL 1 MG/ML IJ SOLN
INTRAMUSCULAR | Status: AC
  Filled 2015-09-10: qty 1

## 2015-09-10 MED ORDER — BISACODYL 10 MG RE SUPP: 10.0000 mg | Freq: Every day | RECTAL | Status: DC | PRN

## 2015-09-10 MED ORDER — CEFAZOLIN SODIUM-DEXTROSE 2-3 GM-% IV SOLR
INTRAVENOUS | Status: AC
  Filled 2015-09-10: qty 50

## 2015-09-10 MED ORDER — LACTASE 3000 UNITS PO TABS
3000.0000 [IU] | ORAL_TABLET | ORAL | Status: DC | PRN
  Filled 2015-09-10: qty 1

## 2015-09-10 MED ORDER — PROPOFOL 500 MG/50ML IV EMUL
INTRAVENOUS | Status: DC | PRN
  Administered 2015-09-10: 100 ug/kg/min via INTRAVENOUS

## 2015-09-10 MED ORDER — MAGNESIUM CITRATE PO SOLN: 1.0000 | Freq: Once | ORAL | Status: DC | PRN

## 2015-09-10 MED ORDER — MIDAZOLAM HCL 5 MG/5ML IJ SOLN
INTRAMUSCULAR | Status: DC | PRN
  Administered 2015-09-10: 2 mg via INTRAVENOUS

## 2015-09-10 MED ORDER — DOCUSATE SODIUM 100 MG PO CAPS
100.0000 mg | ORAL_CAPSULE | Freq: Two times a day (BID) | ORAL | Status: DC
  Administered 2015-09-10 – 2015-09-12 (×4): 100 mg via ORAL

## 2015-09-10 MED ORDER — POLYETHYLENE GLYCOL 3350 17 G PO PACK
17.0000 g | PACK | Freq: Two times a day (BID) | ORAL | Status: DC
Start: 2015-09-10 — End: 2015-09-12
  Administered 2015-09-10 – 2015-09-12 (×4): 17 g via ORAL

## 2015-09-10 MED ORDER — KETOROLAC TROMETHAMINE 15 MG/ML IJ SOLN
15.0000 mg | Freq: Four times a day (QID) | INTRAMUSCULAR | Status: AC
Start: 2015-09-10 — End: 2015-09-11
  Administered 2015-09-10 – 2015-09-11 (×3): 15 mg via INTRAVENOUS
  Filled 2015-09-10 (×3): qty 1

## 2015-09-10 MED ORDER — METOPROLOL SUCCINATE ER 50 MG PO TB24
50.0000 mg | ORAL_TABLET | Freq: Every day | ORAL | Status: DC
  Administered 2015-09-12: 50 mg via ORAL
  Filled 2015-09-10 (×3): qty 1

## 2015-09-10 MED ORDER — CEFAZOLIN SODIUM-DEXTROSE 2-3 GM-% IV SOLR
2.0000 g | INTRAVENOUS | Status: AC
  Administered 2015-09-10: 2 g via INTRAVENOUS

## 2015-09-10 MED ORDER — DEXAMETHASONE SODIUM PHOSPHATE 10 MG/ML IJ SOLN: 10.0000 mg | Freq: Once | INTRAMUSCULAR | Status: DC

## 2015-09-10 MED ORDER — DEXAMETHASONE SODIUM PHOSPHATE 10 MG/ML IJ SOLN
10.0000 mg | Freq: Once | INTRAMUSCULAR | Status: DC
  Filled 2015-09-10: qty 1

## 2015-09-10 MED ORDER — MIDAZOLAM HCL 2 MG/2ML IJ SOLN
INTRAMUSCULAR | Status: AC
  Filled 2015-09-10: qty 4

## 2015-09-10 MED ORDER — FERROUS SULFATE 325 (65 FE) MG PO TABS
325.0000 mg | ORAL_TABLET | Freq: Three times a day (TID) | ORAL | Status: DC
  Filled 2015-09-10 (×8): qty 1

## 2015-09-10 MED ORDER — LORATADINE 10 MG PO TABS
10.0000 mg | ORAL_TABLET | Freq: Every day | ORAL | Status: DC
  Administered 2015-09-11 – 2015-09-12 (×2): 10 mg via ORAL
  Filled 2015-09-10 (×3): qty 1

## 2015-09-10 MED ORDER — TRAMADOL HCL 50 MG PO TABS
50.0000 mg | ORAL_TABLET | Freq: Four times a day (QID) | ORAL | Status: DC
  Administered 2015-09-10: 100 mg via ORAL
  Filled 2015-09-10: qty 2

## 2015-09-10 MED ORDER — CHLORHEXIDINE GLUCONATE 4 % EX LIQD: 60.0000 mL | Freq: Once | CUTANEOUS | Status: DC

## 2015-09-10 MED ORDER — ALUM & MAG HYDROXIDE-SIMETH 200-200-20 MG/5ML PO SUSP
30.0000 mL | ORAL | Status: DC | PRN
  Administered 2015-09-11: 30 mL via ORAL
  Filled 2015-09-10: qty 30

## 2015-09-10 MED ORDER — SODIUM CHLORIDE 0.9 % IR SOLN
Status: DC | PRN
  Administered 2015-09-10: 1000 mL

## 2015-09-10 MED ORDER — SODIUM CHLORIDE 0.9 % IV SOLN
100.0000 mL/h | INTRAVENOUS | Status: DC
  Administered 2015-09-10: 100 mL/h via INTRAVENOUS
  Filled 2015-09-10 (×8): qty 1000

## 2015-09-10 MED ORDER — PROMETHAZINE HCL 25 MG/ML IJ SOLN: 6.2500 mg | INTRAMUSCULAR | Status: DC | PRN

## 2015-09-10 MED ORDER — METHOCARBAMOL 1000 MG/10ML IJ SOLN
500.0000 mg | Freq: Four times a day (QID) | INTRAVENOUS | Status: DC | PRN
  Administered 2015-09-10: 500 mg via INTRAVENOUS
  Filled 2015-09-10 (×2): qty 5

## 2015-09-10 MED ORDER — ONDANSETRON HCL 4 MG/2ML IJ SOLN
INTRAMUSCULAR | Status: AC
  Filled 2015-09-10: qty 2

## 2015-09-10 MED ORDER — LACTATED RINGERS IV SOLN
INTRAVENOUS | Status: DC | PRN
  Administered 2015-09-10 (×3): via INTRAVENOUS

## 2015-09-10 MED ORDER — BUPIVACAINE HCL (PF) 0.5 % IJ SOLN
INTRAMUSCULAR | Status: DC | PRN
  Administered 2015-09-10: 3 mL

## 2015-09-10 MED ORDER — BUPIVACAINE HCL (PF) 0.5 % IJ SOLN
INTRAMUSCULAR | Status: AC
  Filled 2015-09-10: qty 30

## 2015-09-10 SURGICAL SUPPLY — 44 items
BAG DECANTER FOR FLEXI CONT (MISCELLANEOUS) IMPLANT
BAG SPEC THK2 15X12 ZIP CLS (MISCELLANEOUS)
BAG ZIPLOCK 12X15 (MISCELLANEOUS) IMPLANT
CAPT HIP TOTAL 2 ×2 IMPLANT
COVER PERINEAL POST (MISCELLANEOUS) ×3 IMPLANT
DRAPE C-ARM 42X120 X-RAY (DRAPES) ×3 IMPLANT
DRAPE STERI IOBAN 125X83 (DRAPES) ×3 IMPLANT
DRAPE U-SHAPE 47X51 STRL (DRAPES) ×9 IMPLANT
DRSG AQUACEL AG ADV 3.5X10 (GAUZE/BANDAGES/DRESSINGS) ×3 IMPLANT
DURAPREP 26ML APPLICATOR (WOUND CARE) ×3 IMPLANT
ELECT BLADE TIP CTD 4 INCH (ELECTRODE) ×3 IMPLANT
ELECT PENCIL ROCKER SW 15FT (MISCELLANEOUS) IMPLANT
ELECT REM PT RETURN 15FT ADLT (MISCELLANEOUS) IMPLANT
ELECT REM PT RETURN 9FT ADLT (ELECTROSURGICAL) ×3
ELECTRODE REM PT RTRN 9FT ADLT (ELECTROSURGICAL) ×1 IMPLANT
FACESHIELD WRAPAROUND (MASK) ×12 IMPLANT
FACESHIELD WRAPAROUND OR TEAM (MASK) ×4 IMPLANT
GLOVE BIOGEL PI IND STRL 7.5 (GLOVE) ×1 IMPLANT
GLOVE BIOGEL PI IND STRL 8.5 (GLOVE) ×1 IMPLANT
GLOVE BIOGEL PI INDICATOR 7.5 (GLOVE) ×2
GLOVE BIOGEL PI INDICATOR 8.5 (GLOVE) ×2
GLOVE ECLIPSE 8.0 STRL XLNG CF (GLOVE) ×6 IMPLANT
GLOVE ORTHO TXT STRL SZ7.5 (GLOVE) ×3 IMPLANT
GOWN SPEC L3 XXLG W/TWL (GOWN DISPOSABLE) ×3 IMPLANT
GOWN STRL REUS W/TWL LRG LVL3 (GOWN DISPOSABLE) ×3 IMPLANT
HOLDER FOLEY CATH W/STRAP (MISCELLANEOUS) ×3 IMPLANT
KIT BASIN OR (CUSTOM PROCEDURE TRAY) ×3 IMPLANT
LIQUID BAND (GAUZE/BANDAGES/DRESSINGS) ×3 IMPLANT
NDL SAFETY ECLIPSE 18X1.5 (NEEDLE) IMPLANT
NEEDLE HYPO 18GX1.5 SHARP (NEEDLE)
PACK TOTAL JOINT (CUSTOM PROCEDURE TRAY) ×3 IMPLANT
PEN SKIN MARKING BROAD (MISCELLANEOUS) ×3 IMPLANT
SAW OSC TIP CART 19.5X105X1.3 (SAW) ×3 IMPLANT
SUT MNCRL AB 4-0 PS2 18 (SUTURE) ×3 IMPLANT
SUT VIC AB 1 CT1 36 (SUTURE) ×9 IMPLANT
SUT VIC AB 2-0 CT1 27 (SUTURE) ×6
SUT VIC AB 2-0 CT1 TAPERPNT 27 (SUTURE) ×2 IMPLANT
SUT VLOC 180 0 24IN GS25 (SUTURE) ×3 IMPLANT
SYR 50ML LL SCALE MARK (SYRINGE) IMPLANT
TOWEL OR 17X26 10 PK STRL BLUE (TOWEL DISPOSABLE) ×3 IMPLANT
TOWEL OR NON WOVEN STRL DISP B (DISPOSABLE) IMPLANT
TRAY FOLEY W/METER SILVER 14FR (SET/KITS/TRAYS/PACK) ×3 IMPLANT
WATER STERILE IRR 1500ML POUR (IV SOLUTION) ×3 IMPLANT
YANKAUER SUCT BULB TIP 10FT TU (MISCELLANEOUS) ×3 IMPLANT

## 2015-09-10 NOTE — Progress Notes (Signed)
PT Cancellation Note  Patient Details Name: Rhonda [name] MRN: [id number] DOB: [birth year]   Cancelled Treatment:    Reason Eval/Treat Not Completed: Pain limiting ability to participate   [facility] 09/10/2015, 4:54 PM

## 2015-09-10 NOTE — Transfer of Care (Signed)
Immediate Anesthesia Transfer of Care Note  Patient: Rhonda [name]  Procedure(s) Performed: Procedure(s): RIGHT TOTAL HIP ARTHROPLASTY ANTERIOR APPROACH (Right)  Patient Location: PACU  Anesthesia Type:Spinal  Level of Consciousness: awake, alert  and oriented  Airway & Oxygen Therapy: Patient Spontanous Breathing and Patient connected to face mask oxygen  Post-op Assessment: Report given to RN and Post -op Vital signs reviewed and stable  Post vital signs: Reviewed and stable  Last Vitals:  Filed Vitals:   09/10/15 0515  BP: 131/81  Pulse: 75  Temp: 36.6 C  Resp: 18    Complications: No apparent anesthesia complications

## 2015-09-10 NOTE — Discharge Instructions (Signed)

## 2015-09-10 NOTE — Op Note (Signed)
NAME:  Rhonda [name]                ACCOUNT NO.: [id number]      MEDICAL RECORD NO.: [id number]      FACILITY:  [facility]      PHYSICIAN:  Paralee Cancel D  DATE OF BIRTH:  05-22-1951     DATE OF PROCEDURE:  09/10/2015                                 OPERATIVE REPORT         PREOPERATIVE DIAGNOSIS: Right  hip osteoarthritis.      POSTOPERATIVE DIAGNOSIS:  Right hip osteoarthritis.      PROCEDURE:  Right total hip replacement through an anterior approach   utilizing DePuy THR system, component size 36mm pinnacle cup, a size 32+4 neutral   Altrex liner, a size 1 Hi Tri Lock stem with a 32+1 delta ceramic   ball.      SURGEON:  Pietro Cassis. Alvan Dame, M.D.      ASSISTANT:  Danae Orleans, PA-C      ANESTHESIA:  Spinal.      SPECIMENS:  None.      COMPLICATIONS:  None.      BLOOD LOSS:  300 cc     DRAINS:  None.      INDICATION OF THE PROCEDURE:  Rhonda [name] is a 64 y.o. female who had   presented to office for evaluation of right hip pain.  Radiographs revealed   progressive degenerative changes with bone-on-bone   articulation to the  hip joint.  The patient had painful limited range of   motion significantly affecting their overall quality of life.  The patient was failing to    respond to conservative measures, and at this point was ready   to proceed with more definitive measures.  The patient has noted progressive   degenerative changes in his hip, progressive problems and dysfunction   with regarding the hip prior to surgery.  Consent was obtained for   benefit of pain relief.  Specific risk of infection, DVT, component   failure, dislocation, need for revision surgery, as well discussion of   the anterior versus posterior approach were reviewed.  Consent was   obtained for benefit of anterior pain relief through an anterior   approach.      PROCEDURE IN DETAIL:  The patient was brought to operative theater.   Once adequate anesthesia,  preoperative antibiotics, 2gm of Ancef, 1 gm of Tranexamic Acid, amd 10 mg of Decadron administered.   The patient was positioned supine on the OSI Hanna table.  Once adequate   padding of boney process was carried out, we had predraped out the hip, and  used fluoroscopy to confirm orientation of the pelvis and position.      The right hip was then prepped and draped from proximal iliac crest to   mid thigh with shower curtain technique.      Time-out was performed identifying the patient, planned procedure, and   extremity.     An incision was then made 2 cm distal and lateral to the   anterior superior iliac spine extending over the orientation of the   tensor fascia lata muscle and sharp dissection was carried down to the   fascia of the muscle and protractor placed in the soft tissues.      The fascia  was then incised.  The muscle belly was identified and swept   laterally and retractor placed along the superior neck.  Following   cauterization of the circumflex vessels and removing some pericapsular   fat, a second cobra retractor was placed on the inferior neck.  A third   retractor was placed on the anterior acetabulum after elevating the   anterior rectus.  A L-capsulotomy was along the line of the   superior neck to the trochanteric fossa, then extended proximally and   distally.  Tag sutures were placed and the retractors were then placed   intracapsular.  We then identified the trochanteric fossa and   orientation of my neck cut, confirmed this radiographically   and then made a neck osteotomy with the femur on traction.  The femoral   head was removed without difficulty or complication.  Traction was let   off and retractors were placed posterior and anterior around the   acetabulum.      The labrum and foveal tissue were debrided.  I began reaming with a 31mm   reamer and reamed up to 20mm reamer with good bony bed preparation and a 15mm   cup was chosen.  The final 2mm  Pinnacle cup was then impacted under fluoroscopy  to confirm the depth of penetration and orientation with respect to   abduction.  A screw was placed followed by the hole eliminator.  The final   32+4 neutral Altrex liner was impacted with good visualized rim fit.  The cup was positioned anatomically within the acetabular portion of the pelvis.      At this point, the femur was rolled at 80 degrees.  Further capsule was   released off the inferior aspect of the femoral neck.  I then   released the superior capsule proximally.  The hook was placed laterally   along the femur and elevated manually and held in position with the bed   hook.  The leg was then extended and adducted with the leg rolled to 100   degrees of external rotation.  Once the proximal femur was fully   exposed, I used a box osteotome to set orientation.  I then began   broaching with the starting chili pepper broach and passed this by hand and then broached up to 1.  With the 1 broach in place I chose a high offset neck and did trial reductions.  The offset was appropriate, leg lengths   appeared to be best equal to the other side using the +1 head ball trial, confirmed radiographically.   Given these findings, I went ahead and dislocated the hip, repositioned all   retractors and positioned the right hip in the extended and abducted position.  The final 1 Hi Tri Lock stem was   chosen and it was impacted down to the level of neck cut.  Based on this   and the trial reduction, a 32+1 delta ceramic ball was chosen and   impacted onto a clean and dry trunnion, and the hip was reduced.  The   hip had been irrigated throughout the case again at this point.  I did   reapproximate the superior capsular leaflet to the anterior leaflet   using #1 Vicryl.  The fascia of the   tensor fascia lata muscle was then reapproximated using #1 Vicryl and # 0 V-lock sutures.  The   remaining wound was closed with 2-0 Vicryl and running 4-0  Monocryl.   The  hip was cleaned, dried, and dressed sterilely using Dermabond and   Aquacel dressing.  She was then brought   to recovery room in stable condition tolerating the procedure well.    Danae Orleans, PA-C was present for the entirety of the case involved from   preoperative positioning, perioperative retractor management, general   facilitation of the case, as well as primary wound closure as assistant.            Pietro Cassis Alvan Dame, M.D.        09/10/2015 8:42 AM

## 2015-09-10 NOTE — Anesthesia Preprocedure Evaluation (Addendum)
Anesthesia Evaluation  Patient identified by MRN, date of birth, ID band Patient awake    Reviewed: Allergy & Precautions, NPO status , Patient's Chart, lab work & pertinent test results  Airway Mallampati: III  TM Distance: >3 FB Neck ROM: Full  Mouth opening: Limited Mouth Opening  Dental no notable dental hx.    Pulmonary asthma ,  Pulmonary fibrosis   Pulmonary exam normal breath sounds clear to auscultation       Cardiovascular Normal cardiovascular exam+ Valvular Problems/Murmurs MVP and MR  Rhythm:Regular Rate:Normal     Neuro/Psych negative neurological ROS  negative psych ROS   GI/Hepatic Neg liver ROS, hiatal hernia,   Endo/Other  negative endocrine ROS  Renal/GU negative Renal ROS  negative genitourinary   Musculoskeletal negative musculoskeletal ROS (+)   Abdominal   Peds negative pediatric ROS (+)  Hematology negative hematology ROS (+)   Anesthesia Other Findings   Reproductive/Obstetrics negative OB ROS                            Anesthesia Physical Anesthesia Plan  ASA: III  Anesthesia Plan: Spinal   Post-op Pain Management:    Induction:   Airway Management Planned: Simple Face Mask  Additional Equipment:   Intra-op Plan:   Post-operative Plan:   Informed Consent: I have reviewed the patients History and Physical, chart, labs and discussed the procedure including the risks, benefits and alternatives for the proposed anesthesia with the patient or authorized representative who has indicated his/her understanding and acceptance.   Dental advisory given  Plan Discussed with: CRNA  Anesthesia Plan Comments: (Previous Grade 3 view with Mac 3, Grade 2 with glidescope)        Anesthesia Quick Evaluation

## 2015-09-10 NOTE — Anesthesia Procedure Notes (Signed)
Spinal Patient location during procedure: OR End time: 09/10/2015 7:24 AM Staffing Resident/CRNA: Noralyn Pick D Performed by: anesthesiologist and resident/CRNA  Preanesthetic Checklist Completed: patient identified, site marked, surgical consent, pre-op evaluation, timeout performed, IV checked, risks and benefits discussed and monitors and equipment checked Spinal Block Patient position: sitting Prep: Betadine Patient monitoring: heart rate, continuous pulse ox and blood pressure Approach: right paramedian Location: L2-3 Injection technique: single-shot Needle Needle type: Spinocan  Needle gauge: 22 G Needle length: 9 cm Assessment Sensory level: T6 Additional Notes Expiration date of kit checked and confirmed. Patient tolerated procedure well, without complications.  Prep with chloraprep d/t betadine allergy

## 2015-09-10 NOTE — Anesthesia Postprocedure Evaluation (Signed)
  Anesthesia Post-op Note  Patient: Rhonda [name]  Procedure(s) Performed: Procedure(s) (LRB): RIGHT TOTAL HIP ARTHROPLASTY ANTERIOR APPROACH (Right)  Patient Location: PACU  Anesthesia Type: Spinal  Level of Consciousness: awake and alert   Airway and Oxygen Therapy: Patient Spontanous Breathing  Post-op Pain: mild  Post-op Assessment: Post-op Vital signs reviewed, Patient's Cardiovascular Status Stable, Respiratory Function Stable, Patent Airway and No signs of Nausea or vomiting  Last Vitals:  Filed Vitals:   09/10/15 1028  BP: 119/51  Pulse: 56  Temp: 36.4 C  Resp: 15    Post-op Vital Signs: stable   Complications: No apparent anesthesia complications

## 2015-09-10 NOTE — Progress Notes (Signed)
Utilization review completed.  

## 2015-09-10 NOTE — Interval H&P Note (Signed)
History and Physical Interval Note:  09/10/2015 6:56 AM  Rhonda [name]  has presented today for surgery, with the diagnosis of right hip osteoarthritis  The various methods of treatment have been discussed with the patient and family. After consideration of risks, benefits and other options for treatment, the patient has consented to  Procedure(s): RIGHT TOTAL HIP ARTHROPLASTY ANTERIOR APPROACH (Right) as a surgical intervention .  The patient's history has been reviewed, patient examined, no change in status, stable for surgery.  I have reviewed the patient's chart and labs.  Questions were answered to the patient's satisfaction.     Mauri Pole

## 2015-09-11 DIAGNOSIS — E669 Obesity, unspecified: Secondary | ICD-10-CM | POA: Diagnosis present

## 2015-09-11 LAB — CBC
HCT: 36.7 % (ref 36.0–46.0)
Hemoglobin: 11.7 g/dL — ABNORMAL LOW (ref 12.0–15.0)
MCH: 30.2 pg (ref 26.0–34.0)
MCHC: 31.9 g/dL (ref 30.0–36.0)
MCV: 94.6 fL (ref 78.0–100.0)
PLATELETS: 169 10*3/uL (ref 150–400)
RBC: 3.88 MIL/uL (ref 3.87–5.11)
RDW: 13.4 % (ref 11.5–15.5)
WBC: 9.8 10*3/uL (ref 4.0–10.5)

## 2015-09-11 LAB — BASIC METABOLIC PANEL
ANION GAP: 4 — AB (ref 5–15)
BUN: 6 mg/dL (ref 6–20)
CALCIUM: 8.1 mg/dL — AB (ref 8.9–10.3)
CO2: 30 mmol/L (ref 22–32)
Chloride: 103 mmol/L (ref 101–111)
Creatinine, Ser: 0.8 mg/dL (ref 0.44–1.00)
Glucose, Bld: 128 mg/dL — ABNORMAL HIGH (ref 65–99)
Potassium: 4 mmol/L (ref 3.5–5.1)
SODIUM: 137 mmol/L (ref 135–145)

## 2015-09-11 MED ORDER — TRAMADOL HCL 50 MG PO TABS
50.0000 mg | ORAL_TABLET | Freq: Four times a day (QID) | ORAL | Status: DC | PRN
  Administered 2015-09-11 – 2015-09-12 (×4): 100 mg via ORAL
  Filled 2015-09-11 (×5): qty 2

## 2015-09-11 MED ORDER — SODIUM CHLORIDE 0.9 % IV BOLUS (SEPSIS)
250.0000 mL | Freq: Once | INTRAVENOUS | Status: AC
  Administered 2015-09-11: 250 mL via INTRAVENOUS

## 2015-09-11 NOTE — Evaluation (Signed)
Occupational Therapy Evaluation Patient Details Name: BELVIA GOTSCHALL MRN: [id number] DOB: 23-Jun-1951 Today's Date: 09/11/2015    History of Present Illness R THR   Clinical Impression   This 64 year old female was admitted for the above sx. All education was completed.  No further OT is needed at this time.    Follow Up Recommendations  No OT follow up;Supervision/Assistance - 24 hour    Equipment Recommendations   (pt will consider tub bench if seat doesn't fit)    Recommendations for Other Services       Precautions / Restrictions Precautions Precautions: Fall Restrictions Weight Bearing Restrictions: No Other Position/Activity Restrictions: WBAT      Mobility Bed Mobility            General bed mobility comments: oob  Transfers Overall transfer level: Needs assistance Equipment used: Rolling walker (2 wheeled) Transfers: Sit to/from Stand Sit to Stand: Min assist         General transfer comment: cues for UE/LE placement    Balance                                            ADL Overall ADL's : Needs assistance/impaired             Lower Body Bathing: Minimal assistance;Sit to/from stand;With adaptive equipment       Lower Body Dressing: Moderate assistance;Sit to/from stand;With adaptive equipment   Toilet Transfer: Min guard;Ambulation;BSC             General ADL Comments: ambulated to bathroom and practiced toilet transfer:  pt still has catheter.  She has a reacher:  reviewed ADL uses.  Pt has borrowed a tub seat; reviewed tub readiness.  She is not sure that this will fit at the back of the tub--it may be too wide.  Husband will check this out.  Gave resources for tub bench, if she doesn't want to sponge bathe initially.  She used a tub bench last time she had sx but got rid of this.  Educated on sidestepping through tight space and safety for sit to stand     Vision     Perception     Praxis       Pertinent Vitals/Pain Pain Assessment: 0-10 Pain Score: 2  Pain Location: R hip Pain Descriptors / Indicators: Sore Pain Intervention(s): Limited activity within patient's tolerance;Monitored during session;Premedicated before session;Repositioned;Ice applied     Hand Dominance     Extremity/Trunk Assessment Upper Extremity Assessment Upper Extremity Assessment: Overall WFL for tasks assessed      Cervical / Trunk Assessment Cervical / Trunk Assessment: Normal   Communication Communication Communication: No difficulties   Cognition Arousal/Alertness: Awake/alert Behavior During Therapy: WFL for tasks assessed/performed Overall Cognitive Status: Within Functional Limits for tasks assessed                     General Comments       Exercises      Shoulder Instructions      Home Living Family/patient expects to be discharged to:: Private residence Living Arrangements: Spouse/significant other Available Help at Discharge: Family Type of Home: House Home Access: Stairs to enter Technical brewer of Steps: 3 Entrance Stairs-Rails: Left Home Layout: Two level;Bed/bath upstairs Alternate Level Stairs-Number of Steps: 14 Alternate Level Stairs-Rails: Left Bathroom Shower/Tub: Tub/shower unit;Curtain   Biochemist, clinical: Standard  Bathroom Accessibility: Yes   Home Equipment: Shower seat;Toilet riser          Prior Functioning/Environment Level of Independence: Independent             OT Diagnosis: Generalized weakness   OT Problem List:     OT Treatment/Interventions:      OT Goals(Current goals can be found in the care plan section) Acute Rehab OT Goals Patient Stated Goal: return to being independent  OT Frequency:     Barriers to D/C:            Co-evaluation              End of Session    Activity Tolerance: Patient tolerated treatment well Patient left: in chair;with call bell/phone within reach;with chair alarm  set;with family/visitor present   Time: 7471-5953 OT Time Calculation (min): 31 min Charges:  OT General Charges $OT Visit: 1 Procedure OT Evaluation $Initial OT Evaluation Tier I: 1 Procedure OT Treatments $Self Care/Home Management : 8-22 mins G-Codes:    Laiklyn Pilkenton October 05, 2015, 1:51 PM  Lesle Chris, OTR/L [phone] 10/05/2015

## 2015-09-11 NOTE — Care Management Note (Signed)
Case Management Note  Patient Details  Name: Rhonda [name] MRN: [id number] Date of Birth: 05/30/1951  Subjective/Objective:                   RIGHT TOTAL HIP ARTHROPLASTY ANTERIOR APPROACH (Right)  Action/Plan:  discharge planning Expected Discharge Date:                  Expected Discharge Plan:  Home/Self Care  In-House Referral:     Discharge planning Services  CM Consult  Post Acute Care Choice:    Choice offered to:  NA  DME Arranged:  N/A DME Agency:  NA  HH Arranged:  NA HH Agency:  NA  Status of Service:     Medicare Important Message Given:    Date Medicare IM Given:    Medicare IM give by:    Date Additional Medicare IM Given:    Additional Medicare Important Message give by:     If discussed at [place] of Stay Meetings, dates discussed:    Additional Comments: CM notes pt will have no HHC (per MD); Pt has DME.  NO other CM needs were communicated. Dellie Catholic, RN 09/11/2015, 11:53 AM

## 2015-09-11 NOTE — Progress Notes (Signed)
Physical Therapy Treatment Patient Details Name: Rhonda [name] MRN: [id number] DOB: 06-30-1951 Today's Date: 09/19/15    History of Present Illness R THR    PT Comments    Marked improvement in activity tolerance with decreased report of dizziness with activity.  Pt hopeful for dc tomorrow.  Follow Up Recommendations  Home health PT     Equipment Recommendations  None recommended by PT    Recommendations for Other Services OT consult     Precautions / Restrictions Precautions Precautions: Fall Restrictions Weight Bearing Restrictions: No Other Position/Activity Restrictions: WBAT    Mobility  Bed Mobility               General bed mobility comments: Pt OOB, declines back to bed until after bath  Transfers Overall transfer level: Needs assistance Equipment used: Rolling walker (2 wheeled) Transfers: Sit to/from Stand Sit to Stand: Min assist         General transfer comment: cues for UE/LE placement  Ambulation/Gait Ambulation/Gait assistance: Min assist Ambulation Distance (Feet): 123 Feet Assistive device: Rolling walker (2 wheeled) Gait Pattern/deviations: Step-to pattern;Step-through pattern;Decreased step length - right;Decreased step length - left;Shuffle;Trunk flexed     General Gait Details: cues for sequence, posture and position from RW.  Pt c/o intermittant dizziness - BP 144/66   Stairs            Wheelchair Mobility    Modified Rankin (Stroke Patients Only)       Balance                                    Cognition Arousal/Alertness: Awake/alert Behavior During Therapy: WFL for tasks assessed/performed Overall Cognitive Status: Within Functional Limits for tasks assessed                      Exercises      General Comments        Pertinent Vitals/Pain Pain Assessment: 0-10 Pain Score: 3  Pain Location: R hip Pain Descriptors / Indicators: Aching;Sore Pain Intervention(s): Limited  activity within patient's tolerance;Monitored during session;Premedicated before session;Ice applied    Home Living Family/patient expects to be discharged to:: Private residence Living Arrangements: Spouse/significant other           Home Equipment: Shower seat;Toilet riser      Prior Function Level of Independence: Independent          PT Goals (current goals can now be found in the care plan section) Acute Rehab PT Goals Patient Stated Goal: return to being independent PT Goal Formulation: With patient Time For Goal Achievement: 09/14/15 Potential to Achieve Goals: Good Progress towards PT goals: Progressing toward goals    Frequency  7X/week    PT Plan Current plan remains appropriate    Co-evaluation             End of Session Equipment Utilized During Treatment: Gait belt Activity Tolerance: Patient tolerated treatment well Patient left: in chair;with call bell/phone within reach     Time: 1517-1531 PT Time Calculation (min) (ACUTE ONLY): 14 min  Charges:  $Gait Training: 8-22 mins                    G Codes:      Rhonda [name] 09/19/15, 4:56 PM

## 2015-09-11 NOTE — Evaluation (Signed)
Physical Therapy Evaluation Patient Details Name: Rhonda [name] MRN: [id number] DOB: 1951-08-11 Today's Date: 09/11/2015   History of Present Illness  R THR  Clinical Impression  Pt s/p R THR presents with decreased R LE strength/ROM and post op pain limiting functional mobility.  Pt should progress to dc home with family assist and HHPT follow up.    Follow Up Recommendations Home health PT    Equipment Recommendations  None recommended by PT    Recommendations for Other Services OT consult     Precautions / Restrictions Precautions Precautions: Fall Restrictions Weight Bearing Restrictions: No Other Position/Activity Restrictions: WBAT      Mobility  Bed Mobility Overal bed mobility: Needs Assistance Bed Mobility: Supine to Sit     Supine to sit: Min assist     General bed mobility comments: cues for sequence and use of L LE to self assist  Transfers Overall transfer level: Needs assistance Equipment used: Rolling walker (2 wheeled) Transfers: Sit to/from Stand Sit to Stand: Min assist;Mod assist         General transfer comment: cues for LE management and use of UEs to self assist  Ambulation/Gait Ambulation/Gait assistance: Min assist;Mod assist Ambulation Distance (Feet): 5 Feet Assistive device: Rolling walker (2 wheeled) Gait Pattern/deviations: Step-to pattern;Decreased step length - right;Decreased step length - left;Shuffle;Trunk flexed     General Gait Details: cues for sequence, posture and position from RW.  Ltd by pt c/o dizziness  Stairs            Wheelchair Mobility    Modified Rankin (Stroke Patients Only)       Balance                                             Pertinent Vitals/Pain Pain Assessment: 0-10 Pain Score: 4  Pain Location: R hip Pain Descriptors / Indicators: Aching;Sore Pain Intervention(s): Limited activity within patient's tolerance;Monitored during session;Premedicated before  session;Ice applied    Home Living Family/patient expects to be discharged to:: Private residence Living Arrangements: Spouse/significant other Available Help at Discharge: Family Type of Home: House Home Access: Stairs to enter Entrance Stairs-Rails: Left Entrance Stairs-Number of Steps: 3 Home Layout: Two level;Bed/bath upstairs Home Equipment: Tub bench;Walker - 2 wheels;Toilet riser      Prior Function Level of Independence: Independent               Hand Dominance        Extremity/Trunk Assessment   Upper Extremity Assessment: Overall WFL for tasks assessed           Lower Extremity Assessment: RLE deficits/detail RLE Deficits / Details: Strength at hip 2+/5 with AAROM at hip to 90 flex and 20 abd    Cervical / Trunk Assessment: Normal  Communication   Communication: No difficulties  Cognition Arousal/Alertness: Awake/alert Behavior During Therapy: WFL for tasks assessed/performed Overall Cognitive Status: Within Functional Limits for tasks assessed                      General Comments      Exercises Total Joint Exercises Ankle Circles/Pumps: AROM;Both;10 reps;Supine Quad Sets: AROM;Both;10 reps;Supine Heel Slides: AAROM;Right;15 reps;Supine Hip ABduction/ADduction: AAROM;Right;10 reps;Supine      Assessment/Plan    PT Assessment Patient needs continued PT services  PT Diagnosis Difficulty walking   PT Problem List Decreased strength;Decreased range of  motion;Decreased activity tolerance;Decreased mobility;Decreased knowledge of use of DME;Pain  PT Treatment Interventions DME instruction;Gait training;Stair training;Functional mobility training;Therapeutic activities;Therapeutic exercise;Patient/family education   PT Goals (Current goals can be found in the Care Plan section) Acute Rehab PT Goals Patient Stated Goal: Resume previous lifestyle with decreased pain PT Goal Formulation: With patient Time For Goal Achievement:  09/14/15 Potential to Achieve Goals: Good    Frequency 7X/week   Barriers to discharge        Co-evaluation               End of Session Equipment Utilized During Treatment: Gait belt Activity Tolerance: Patient tolerated treatment well Patient left: in chair;with call bell/phone within reach;with family/visitor present Nurse Communication: Mobility status         Time: 4707-6151 PT Time Calculation (min) (ACUTE ONLY): 28 min   Charges:   PT Evaluation $Initial PT Evaluation Tier I: 1 Procedure PT Treatments $Therapeutic Exercise: 8-22 mins   PT G Codes:        Jamair Cato Sep 29, 2015, 12:27 PM

## 2015-09-11 NOTE — Progress Notes (Signed)
OT Cancellation Note  Patient Details Name: LAYLI CAPSHAW MRN: [id number] DOB: [birth year]   Cancelled Treatment:    Reason Eval/Treat Not Completed: Other (comment).  Pt is getting a bolus.  Will return later today.  Hannan Tetzlaff 09/11/2015, 8:31 AM  Lesle Chris, OTR/L [phone] 09/11/2015

## 2015-09-11 NOTE — Progress Notes (Signed)
     Subjective: 1 Day Post-Op Procedure(s) (LRB): RIGHT TOTAL HIP ARTHROPLASTY ANTERIOR APPROACH (Right)   Patient reports pain as moderate in the right hip.  The pain appear to be controlled by the pain medications, but the medication seem to be effecting her a bit.  She is complaining of both severe light headedness and some nausea.  We have discussed trying to reduce her pain medication now that she is out side of the acute phase of her pain. She seems willing to try.  Objective:   VITALS:   Filed Vitals:   09/11/15  BP: 114/53  Pulse: 75  Temp: 98.1 F (36.7 C)   Resp: 16    Dorsiflexion/Plantar flexion intact Incision: dressing C/D/I No cellulitis present Compartment soft  LABS  Recent Labs  09/11/15 0510  HGB 11.7*  HCT 36.7  WBC 9.8  PLT 169     Recent Labs  09/11/15 0510  NA 137  K 4.0  BUN 6  CREATININE 0.80  GLUCOSE 128*     Assessment/Plan: 1 Day Post-Op Procedure(s) (LRB): RIGHT TOTAL HIP ARTHROPLASTY ANTERIOR APPROACH (Right) Foley cath d/c'ed Advance diet Up with therapy D/C IV fluids Discharge home with home health   Obese (BMI 30-39.9) Estimated body mass index is 30.27 kg/(m^2) as calculated from the following:   Height as of this encounter: 5' (1.524 m).   Weight as of this encounter: 70.308 kg (155 lb). Patient also counseled that weight may inhibit the healing process Patient counseled that losing weight will help with future health issues      West Pugh. Laylanie Kruczek   PAC  09/11/2015, 9:37 AM

## 2015-09-12 LAB — BASIC METABOLIC PANEL
Anion gap: 5 (ref 5–15)
BUN: 6 mg/dL (ref 6–20)
CALCIUM: 8.1 mg/dL — AB (ref 8.9–10.3)
CO2: 29 mmol/L (ref 22–32)
CREATININE: 0.69 mg/dL (ref 0.44–1.00)
Chloride: 103 mmol/L (ref 101–111)
GFR calc non Af Amer: >60 mL/min (ref >60)
Glucose, Bld: 118 mg/dL — ABNORMAL HIGH (ref 65–99)
Potassium: 3.9 mmol/L (ref 3.5–5.1)
Sodium: 137 mmol/L (ref 135–145)

## 2015-09-12 LAB — CBC
HCT: 35.5 % — ABNORMAL LOW (ref 36.0–46.0)
Hemoglobin: 11.5 g/dL — ABNORMAL LOW (ref 12.0–15.0)
MCH: 30.3 pg (ref 26.0–34.0)
MCHC: 32.4 g/dL (ref 30.0–36.0)
MCV: 93.7 fL (ref 78.0–100.0)
Platelets: 170 10*3/uL (ref 150–400)
RBC: 3.79 MIL/uL — ABNORMAL LOW (ref 3.87–5.11)
RDW: 13.3 % (ref 11.5–15.5)
WBC: 10.7 10*3/uL — ABNORMAL HIGH (ref 4.0–10.5)

## 2015-09-12 MED ORDER — HYDROMORPHONE HCL 2 MG PO TABS: 2.0000 mg | ORAL_TABLET | Freq: Two times a day (BID) | ORAL | Status: DC | PRN

## 2015-09-12 MED ORDER — POLYETHYLENE GLYCOL 3350 17 G PO PACK: 17.0000 g | PACK | Freq: Two times a day (BID) | ORAL | Status: DC

## 2015-09-12 MED ORDER — TRAMADOL HCL 50 MG PO TABS: 50.0000 mg | ORAL_TABLET | Freq: Four times a day (QID) | ORAL | Status: DC | PRN

## 2015-09-12 MED ORDER — DOCUSATE SODIUM 100 MG PO CAPS: 100.0000 mg | ORAL_CAPSULE | Freq: Two times a day (BID) | ORAL | Status: DC

## 2015-09-12 MED ORDER — PROMETHAZINE HCL 12.5 MG PO TABS: 12.5000 mg | ORAL_TABLET | Freq: Four times a day (QID) | ORAL | Status: DC | PRN

## 2015-09-12 MED ORDER — FERROUS SULFATE 325 (65 FE) MG PO TABS: 325.0000 mg | ORAL_TABLET | Freq: Three times a day (TID) | ORAL | Status: DC

## 2015-09-12 MED ORDER — METHOCARBAMOL 500 MG PO TABS: 500.0000 mg | ORAL_TABLET | Freq: Four times a day (QID) | ORAL | Status: DC | PRN

## 2015-09-12 MED ORDER — ASPIRIN 325 MG PO TBEC: 325.0000 mg | DELAYED_RELEASE_TABLET | Freq: Two times a day (BID) | ORAL | Status: AC

## 2015-09-12 NOTE — Progress Notes (Signed)
Physical Therapy Treatment Patient Details Name: Rhonda [name] MRN: [id number] DOB: 1951/05/06 Today's Date: 09/12/2015    History of Present Illness R THR    PT Comments    Pt progressing well with mobility and no complaints of dizziness.  Reviewed home therex and stairs with written instruction provided.    Follow Up Recommendations  Home health PT     Equipment Recommendations  None recommended by PT    Recommendations for Other Services OT consult     Precautions / Restrictions Precautions Precautions: Fall Restrictions Weight Bearing Restrictions: No Other Position/Activity Restrictions: WBAT    Mobility  Bed Mobility Overal bed mobility: Needs Assistance Bed Mobility: Sit to Supine       Sit to supine: Min guard   General bed mobility comments: cues for sequence and use of L LE to self assist  Transfers Overall transfer level: Needs assistance Equipment used: Rolling walker (2 wheeled) Transfers: Sit to/from Stand Sit to Stand: Supervision         General transfer comment: cues for UE/LE placement  Ambulation/Gait Ambulation/Gait assistance: Min guard;Supervision Ambulation Distance (Feet): 140 Feet Assistive device: Rolling walker (2 wheeled) Gait Pattern/deviations: Step-to pattern;Decreased step length - right;Decreased step length - left;Shuffle;Trunk flexed     General Gait Details: cues for sequence, posture and position from RW.    Stairs Stairs: Yes Stairs assistance: Min assist Stair Management: One rail Left;Step to pattern;Forwards;With cane Number of Stairs: 4 General stair comments: cues for sequence and foot/cane placement.  Written instructions provided  Wheelchair Mobility    Modified Rankin (Stroke Patients Only)       Balance                                    Cognition Arousal/Alertness: Awake/alert Behavior During Therapy: WFL for tasks assessed/performed Overall Cognitive Status: Within  Functional Limits for tasks assessed                      Exercises Total Joint Exercises Ankle Circles/Pumps: AROM;Both;10 reps;Supine Quad Sets: AROM;Both;10 reps;Supine Gluteal Sets: AROM;Both;10 reps;Supine Heel Slides: AAROM;Right;Supine;20 reps Hip ABduction/ADduction: AAROM;Right;Supine;15 reps    General Comments        Pertinent Vitals/Pain Pain Assessment: 0-10 Pain Score: 6  Pain Location: R hip Pain Descriptors / Indicators: Aching;Sore Pain Intervention(s): Limited activity within patient's tolerance;Monitored during session;Premedicated before session;Ice applied    Home Living                      Prior Function            PT Goals (current goals can now be found in the care plan section) Acute Rehab PT Goals Patient Stated Goal: return to being independent PT Goal Formulation: With patient Time For Goal Achievement: 09/14/15 Potential to Achieve Goals: Good Progress towards PT goals: Progressing toward goals    Frequency  7X/week    PT Plan Current plan remains appropriate    Co-evaluation             End of Session Equipment Utilized During Treatment: Gait belt Activity Tolerance: Patient tolerated treatment well Patient left: in bed;with call bell/phone within reach     Time: 0820-0852 PT Time Calculation (min) (ACUTE ONLY): 32 min  Charges:  $Gait Training: 8-22 mins $Therapeutic Exercise: 8-22 mins  G Codes:      Darius Lundberg 09/14/15, 11:05 AM

## 2015-09-12 NOTE — Care Management Important Message (Signed)
Important Message  Patient Details  Name: Rhonda [name] MRN: [id number] Date of Birth: 02/06/1951   Medicare Important Message Given:  [facility] notification given    Camillo Flaming 09/12/2015, 12:25 [place] Message  Patient Details  Name: Rhonda [name] MRN: [id number] Date of Birth: [birth year]   Medicare Important Message Given:  Yes-second notification given    Camillo Flaming 09/12/2015, 12:25 PM

## 2015-09-12 NOTE — Progress Notes (Signed)
     Subjective: 2 Days Post-Op Procedure(s) (LRB): RIGHT TOTAL HIP ARTHROPLASTY ANTERIOR APPROACH (Right)   Patient reports pain as mild, pain controlled. She is also having less dizziness and N&V now that she is off of PO Dilaudid.    Objective:   VITALS:   Filed Vitals:   09/12/15 0500  BP: 142/115  Pulse: 92  Temp: 99.8 F (37.7 C)  Resp: 18    Dorsiflexion/Plantar flexion intact Incision: dressing C/D/I No cellulitis present Compartment soft  LABS  Recent Labs  09/11/15 0510 09/12/15 0512  HGB 11.7* 11.5*  HCT 36.7 35.5*  WBC 9.8 10.7*  PLT 169 170     Recent Labs  09/11/15 0510 09/12/15 0512  NA 137 137  K 4.0 3.9  BUN 6 6  CREATININE 0.80 0.69  GLUCOSE 128* 118*     Assessment/Plan: 2 Days Post-Op Procedure(s) (LRB): RIGHT TOTAL HIP ARTHROPLASTY ANTERIOR APPROACH (Right) Up with therapy Discharge home with home health  Follow up in 2 weeks at [facility]. Follow up with OLIN,Tremain Rucinski D in 2 weeks.  Contact information:  [facility] [address] [place] [phone]    Obese (BMI 30-39.9) Estimated body mass index is 30.27 kg/(m^2) as calculated from the following:   Height as of this encounter: 5' (1.524 m).   Weight as of this encounter: 70.308 kg (155 lb). Patient also counseled that weight may inhibit the healing process Patient counseled that losing weight will help with future health issues         West Pugh. Noelie Renfrow   PAC  09/12/2015, 9:47 AM

## 2015-09-16 NOTE — Discharge Summary (Signed)
Physician Discharge Summary  Patient ID: Rhonda [name] MRN: [id number] DOB/AGE: [birth year] 66 y.o.  Admit date: 09/10/2015 Discharge date: 09/12/2015   Procedures:  Procedure(s) (LRB): RIGHT TOTAL HIP ARTHROPLASTY ANTERIOR APPROACH (Right)  Attending Physician:  Dr. Paralee Cancel   Admission Diagnoses:   Right hip primary OA / pain  Discharge Diagnoses:  Principal Problem:   S/P right THA, AA Active Problems:   Obese  Past Medical History  Diagnosis Date  . High cholesterol   . History of chronic bronchitis     as child  . GERD (gastroesophageal reflux disease)   . Depression   . Allergy   . Asthma   . Cataract   . Pneumonitis, hypersensitivity ([place])     a. 09/2012 s/p Bx - ? 2/2 bird, mold, oil paint exposure ->on steroids, followed by pulm.  . Rapid heart rate     Dr. Radford Pax follows- last visit Epic note 9'16  . DOE (dyspnea on exertion)     a. 04/2010 Lexi MV EF 71%, no ischemia/infarct;    . Hyperplastic colon polyp 2007  . Hiatal hernia   . Interstitial lung disease ([place])   . Insulin resistance     past  . Inguinal hernia     right  . Pulmonary fibrosis (H[facility])     Dr. Chase Caller follows- stable at present  . Helicobacter pylori ab+   . Heart palpitations 02/28/2015  . Pulmonary HTN ([place])     mild PASP 69mmHg on echo 02/2015  . MVP (mitral valve prolapse)     Posterior mitral valve leaflet with mild MR  . Arthritis     history spinal stenosis. osteoarthritis right hip  . H/O steroid therapy     Steroid use orally over 4 yrs- for Lung Fibrosis    HPI:    Rhonda [name], 64 y.o. female, has a history of pain and functional disability in the right hip(s) due to arthritis and patient has failed non-surgical conservative treatments for greater than 12 weeks to include NSAID's and/or analgesics, corticosteriod injections and activity modification. Onset of symptoms was gradual starting years ago with gradually worsening course since that time.The patient  noted no past surgery on the right hip(s). Patient currently rates pain in the right hip at 10 out of 10 with activity. Patient has night pain, worsening of pain with activity and weight bearing, trendelenberg gait, pain that interfers with activities of daily living and pain with passive range of motion. Patient has evidence of periarticular osteophytes and joint space narrowing by imaging studies. This condition presents safety issues increasing the risk of falls. There is no current active infection. Risks, benefits and expectations were discussed with the patient. Risks including but not limited to the risk of anesthesia, blood clots, nerve damage, blood vessel damage, failure of the prosthesis, infection and up to and including death. Patient understand the risks, benefits and expectations and wishes to proceed with surgery.    PCP: Lamar Blinks, MD   Discharged Condition: good  Hospital Course:  Patient underwent the above stated procedure on 09/10/2015. Patient tolerated the procedure well and brought to the recovery room in good condition and subsequently to the floor.  POD #1 BP: 114/53 ; Pulse: 75 ; Temp: 98.1 F (36.7 C) ; Resp: 16 Patient reports pain as moderate in the right hip. The pain appear to be controlled by the pain medications, but the medication seem to be effecting her a bit. She is complaining of both severe light  headedness and some nausea. We have discussed trying to reduce her pain medication now that she is out side of the acute phase of her pain. She seems willing to try. Dorsiflexion/plantar flexion intact, incision: dressing C/D/I, no cellulitis present and compartment soft.   LABS  Basename    HGB  11.7  HCT  36.7   POD #2  BP: 142/115 ; Pulse: 92 ; Temp: 99.8 F (37.7 C) ; Resp: 18 Patient reports pain as mild, pain controlled. She is also having less dizziness and N&V now that she is off of PO Dilaudid. Ready to be discharged  home. Dorsiflexion/plantar flexion intact, incision: dressing C/D/I, no cellulitis present and compartment soft.   LABS  Basename    HGB  11.5  HCT  35.5    Discharge Exam: General appearance: alert, cooperative and no distress Extremities: Homans sign is negative, no sign of DVT, no edema, redness or tenderness in the calves or thighs and no ulcers, gangrene or trophic changes  Disposition: Home with follow up in 2 weeks   Follow-up Information    Follow up with Mauri Pole, MD. Schedule an appointment as soon as possible for a visit in 2 weeks.   Specialty:  Orthopedic Surgery   Contact information:   [address] [place] [address] [phone]       Discharge Instructions    Call MD / Call 911    Complete by:  As directed   If you experience chest pain or shortness of breath, CALL 911 and be transported to the hospital emergency room.  If you develope a fever above 101 F, pus (white drainage) or increased drainage or redness at the wound, or calf pain, call your surgeon's office.     Change dressing    Complete by:  As directed   Maintain surgical dressing until follow up in the clinic. If the edges start to pull up, may reinforce with tape. If the dressing is no longer working, may remove and cover with gauze and tape, but must keep the area dry and clean.  Call with any questions or concerns.     Constipation Prevention    Complete by:  As directed   Drink plenty of fluids.  Prune juice may be helpful.  You may use a stool softener, such as Colace (over the counter) 100 mg twice a day.  Use MiraLax (over the counter) for constipation as needed.     Diet - low sodium heart healthy    Complete by:  As directed      Discharge instructions    Complete by:  As directed   Maintain surgical dressing until follow up in the clinic. If the edges start to pull up, may reinforce with tape. If the dressing is no longer working, may remove and cover with gauze  and tape, but must keep the area dry and clean.  Follow up in 2 weeks at [facility]. Call with any questions or concerns.     Increase activity slowly as tolerated    Complete by:  As directed   Weight bearing as tolerated with assist device (walker, cane, etc) as directed, use it as long as suggested by your surgeon or therapist, typically at least 4-6 weeks.     TED hose    Complete by:  As directed   Use stockings (TED hose) for 2 weeks on both leg(s).  You may remove them at night for sleeping.  Medication List    STOP taking these medications        ALEVE 220 MG tablet  Generic drug:  naproxen sodium     TYLENOL ARTHRITIS PAIN 650 MG CR tablet  Generic drug:  acetaminophen      TAKE these medications        AIRBORNE PO  Take 1 tablet by mouth daily.     alendronate 70 MG tablet  Commonly known as:  FOSAMAX  Take 1 tablet (70 mg total) by mouth every 7 (seven) days. Take with a full glass of water on an empty stomach.     aspirin 325 MG EC tablet  Take 1 tablet (325 mg total) by mouth 2 (two) times daily.     BEANO PO  Take 1 tablet by mouth as needed (if eating onion or garlic).     calcium carbonate 500 MG chewable tablet  Commonly known as:  TUMS - dosed in mg elemental calcium  Chew 2 tablets by mouth daily.     cetirizine 10 MG tablet  Commonly known as:  ZYRTEC  Take 1 tablet (10 mg total) by mouth daily.     chlorpheniramine 4 MG tablet  Commonly known as:  CHLOR-TRIMETON  Take 4 mg by mouth daily as needed for allergies.     dicyclomine 10 MG capsule  Commonly known as:  BENTYL  Take 10 mg by mouth 3 (three) times daily as needed for spasms (stomach cramping).     docusate sodium 100 MG capsule  Commonly known as:  COLACE  Take 1 capsule (100 mg total) by mouth 2 (two) times daily.     ferrous sulfate 325 (65 FE) MG tablet  Take 1 tablet (325 mg total) by mouth 3 (three) times daily after meals.     fluticasone 50  MCG/ACT nasal spray  Commonly known as:  FLONASE  Place into both nostrils daily.     GAVISCON PO  Take 1 tablet by mouth as needed (bad GI distress).     HYDROmorphone 2 MG tablet  Commonly known as:  DILAUDID  Take 1 tablet (2 mg total) by mouth 2 (two) times daily as needed for severe pain.     lactase 3000 UNITS tablet  Commonly known as:  LACTAID  Take 3,000 Units by mouth as needed (when eating ice cream or dariy).     methocarbamol 500 MG tablet  Commonly known as:  ROBAXIN  Take 1 tablet (500 mg total) by mouth every 6 (six) hours as needed for muscle spasms.     metoprolol succinate 50 MG 24 hr tablet  Commonly known as:  TOPROL-XL  TAKE 1 TABLET BY MOUTH EVERY DAY IMMEDIATELY FOLLOWING A MEAL.     omeprazole 20 MG capsule  Commonly known as:  PRILOSEC  Take 1 capsule (20 mg total) by mouth daily.     polyethylene glycol packet  Commonly known as:  MIRALAX / GLYCOLAX  Take 17 g by mouth 2 (two) times daily.     pravastatin 40 MG tablet  Commonly known as:  PRAVACHOL  Take 0.5 tablets (20 mg total) by mouth daily.     predniSONE 5 MG tablet  Commonly known as:  DELTASONE  Take 1 tablet (5 mg total) by mouth daily with breakfast.     PROBIOTIC DAILY PO  Take by mouth. Take 2 gummies daily     promethazine 12.5 MG tablet  Commonly known as:  PHENERGAN  Take 1 tablet (12.5  mg total) by mouth every 6 (six) hours as needed for nausea or vomiting.     traMADol 50 MG tablet  Commonly known as:  ULTRAM  Take 1-2 tablets (50-100 mg total) by mouth every 6 (six) hours as needed for moderate pain or severe pain.     vitamin C 1000 MG tablet  Take 1,000 mg by mouth daily.         Signed: West Pugh. Aurelius Gildersleeve   PA-C  09/16/2015, 12:36 PM

## 2015-10-10 ENCOUNTER — Telehealth: Payer: Self-pay | Admitting: Internal Medicine

## 2015-10-10 NOTE — Telephone Encounter (Signed)
Spoke with the pt and scheduled ov with MR for tomorrow at 2 pm

## 2015-10-10 NOTE — Telephone Encounter (Signed)
Rhonda [name] called and is not feeling well. Desats. Cough. Recent hip surgery. Pleaswe work in to my schedule tomorrow - double book somewhere in the middle,. Maybe the 14.15 slot or later

## 2015-10-11 ENCOUNTER — Encounter: Payer: Self-pay | Admitting: Internal Medicine

## 2015-10-11 ENCOUNTER — Ambulatory Visit (INDEPENDENT_AMBULATORY_CARE_PROVIDER_SITE_OTHER): Payer: Medicare Other | Admitting: Internal Medicine

## 2015-10-11 ENCOUNTER — Ambulatory Visit (INDEPENDENT_AMBULATORY_CARE_PROVIDER_SITE_OTHER)
Admission: RE | Admit: 2015-10-11 | Discharge: 2015-10-11 | Disposition: A | Discharge status: Home / Self Care | Payer: Medicare Other | Source: Ambulatory Visit | Attending: Internal Medicine | Admitting: Internal Medicine

## 2015-10-11 VITALS — BP 120/68 | HR 86 | Ht 60.0 in | Wt 153.0 lb

## 2015-10-11 DIAGNOSIS — R0789 Other chest pain: Secondary | ICD-10-CM | POA: Diagnosis not present

## 2015-10-11 DIAGNOSIS — J849 Interstitial pulmonary disease, unspecified: Secondary | ICD-10-CM

## 2015-10-11 DIAGNOSIS — J209 Acute bronchitis, unspecified: Secondary | ICD-10-CM

## 2015-10-11 DIAGNOSIS — J679 Hypersensitivity pneumonitis due to unspecified organic dust: Secondary | ICD-10-CM | POA: Diagnosis not present

## 2015-10-11 MED ORDER — PREDNISONE 10 MG PO TABS: ORAL_TABLET | ORAL | Status: DC

## 2015-10-11 MED ORDER — DOXYCYCLINE HYCLATE 100 MG PO TABS: ORAL_TABLET | ORAL | Status: DC

## 2015-10-11 NOTE — Progress Notes (Signed)
Subjective:     Patient ID: Rhonda [name], female   DOB: July 11, 1951, 64 y.o.   MRN: [id number]  HPI        #GE reflux with small hiatal hernia  - on ppi  #MEtabolic Syndrome  - On low glycemic diet  159#  on 09/19/12 147# on 11/21/12 145# on 01/02/13 145# on 02/28/2013 147# - on 05/25/2013 154# - 09/18/14   #History of rapid heart rate not otherwise specified  - Start her on Lopressor  2008/2009 by primary care physician. History appears to correlate with onset of respiratory issues  - refuses to dc this drug as of 2014 discussion   # Hypersensitivity Pneumonitis and ILD  - Potential etiologies - cockateil x 2 for 18 years through end 2012. 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 Denies mold but lives in house built in [place] with a "weird baselment: and has humidifier  - first noted on CT chest 10/15/09 following trip to [place] (PE negative) but not described in 2003 CT chest report  - autoimmune panel: 10/23/11: Negative  -  Uderwennt bronch 11/19/11  - non diagnostic  - VATS Nov 2013 (done after initially refusing)- [facility]. However, there is worrying trend of UIP pattern in the Upper lobes.            Oct/Nov 2012  March 2013 08/11/12  Nov/Dec 2013 Dx HP/UIP 01/02/2013  02/28/13 05/25/2013  12/06/2013  03/15/2014  06/29/2014  10'27/15 12/19/2014  02/26/15 10/11/2015   Symp/Signs Dyspnea x5y  New crackles  dimished dyspnea except at hill. Improved crackles.  Improved dyspnea except @ hill          Fev1  1.9 L   1.7L/90%  1.66 L/87%   1.66 L/78%  1.75L/90% 1.68L/82% 1.56/75 1.6L/78% 1.48/75% 1.58L/77%  1.52L/78%   FVC  2.6 L    2.2 L/82%   2.1 L/80%   2.19 L/82%  2.2L/91% 2.1/81% 1.86/69% 1.9L/71% 1.86/75% 1.75L/65% 1.82/73% (dec 2015 offce spiro) 2.84L/72%   Ratio     79   76/95%  80/99% 80/100%  84  90     fef 25-75%                TLC  4.3 L    3.3 L/79%   3.5 L/82%     3.22/72% 3.06L/68%  3.06/68%  3.78/87%   DLCO  10/50%    10/51%   9.6/51%     11.9/63% 11.7/62%  11/561%  5.5/31%   Walk test 185 ft x 3 laps on RA  rest 92%. Pk exertion 90%. Pk HR 108 Rest 92%. Pk exertion 86% at 2.5 laps. Pk HR 102  pk exertion - 88% Rest 96%. Pk 95%. Pk HR is 75. Done at full dose lopressor Rest 94%. Pk 91%. Rest HR 93 with pk HR 103/min. Done at half dose lopressor REst 96%. Pk pulse ox 92%. Rest HR 65. Pk HR 86 Did not desat. Normal Pk HR  Pulse ox 96%, droped to89% at 2nd lap but bounced up and ended at 91% 3rd lap Did not desat Did not desat, Pk HHR 93  Pulse ox low 92%, pk HR 107  CT chest   unchanged             Tests/Bx Bronch 0- non diagnostic   VATS - HP with UIP in Upper lobe  RX    Rx pred start 11/23/12 On  pred 30mg  per day and will reduce to 20mg  On Pred 20mg  per day On pred 10mg  per day On pred 5mg  per day Rx pred 5mg  daily She reduced to pred 5mg  x past 1 month QOD due to GI concern side effects On pred 5mg  qod but will increase to 5/d after this visit On pred 5mg  daily  On pred 5mg  daily                    OV 12/19/2014  Chief Complaint  Patient presents with  . Follow-up    Pt stated she is much improved since being sick. Pt c/o DOE d/t cold weather, prod cough with white mucus, PND and epigastric pain.     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 Now cough has improved to near baseline.  Stil bothered by the residual cough which seems to have significant neurogenic component (cleras throat, gag etc., ). Dyspnea on exertion is class 2 and stable compated to last visit with stable spirometry and walking desat test. However, husband feels over time pst few years dyspnea is progressive. She is very afraid of even the mildes incline. Swimming poses problem for dyspnea and cough. Never been to rehab due to daily swimming. Now worried  that dyspnea/cough can impact desire to travel abroad. Also, seems to be having non specific msk tupe right chest wall pain for many months.   Past, Family, Social reviewed: no change since last visit   OV 02/26/2015  Chief Complaint  Patient presents with  . Follow-up    Pt here after PFT and HRCT. Pt stated her breathing is better but her allergies are bothersome. Pt c/o prod cough with white mucus, sinus congestion, rhinorrhea. Pt stated her cough is better when she lays down.     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 AT that time big issue was severe cough which was felt largely cough hyperesnitvity due to fall 2015 viral infection. She says that slowly resolved. Wit that dyspnea improved too. Last week at [place] she climbed stairs etc., better than ever before. However, past week pollen level high and having cough again though not as bad. She is takng nasal steroid, OTC antihistamine x 2 and his helping. Dyspnea itself is stble. Cntinues pred 5mg  per day. Attendedd transplant lecture at [facility]. Planning [place] trip in summer 2016. 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 The chronic cough is resolved. [place] trip went well. Dyhspnea is mild , on exertion , relieved by rest and stable withoiut change. Continues prednisone 5mg  per day. She is is due for Hip surgery with Dr Alvan Dame in Oct 2017. She has lot of questions that she wants me to talk to DR Alvan Dame about. I spoke to him several days after this visit when he called and resolved questions. Overall low risk for pulmonary complications after hip surgery  Spiro FVC 1.8L and walking ddesat test - no desat in office  Suggests stability\   OV 10/11/2015  Chief Complaint  Patient presents with  . Follow-up    Pt c/o increaes in SOB, desaturation with activity. Pt c/o recent cough with yellow mucus in  morning and white mucus  throughout the day. Pt c/o pain under right breast when twisting or bending over. Pt stated she had a right hip replacement.    follow-up interstitial lung disease due to hypersensitivity pneumonitis on baseline prednisone 5 mg per day  She had hip surgery 09/09/2015 and has had an uneventful postoperative course. She is ambulating slowly and her pain is resolved. 10 days ago started having worsening cough similar to 4 2015 when she had acute bronchitis which degenerated into chronic irritable larynx syndrome and chronic cough. Then some 3 days ago the sputum changed green in color associated with right-sided musculoskeletal chest pain at the site of surgical lung biopsy incision from the past. Of note she has a history of pneumothorax of the right side but a spontaneous but on the same side a surgical lung biopsy. No specific fever or hemoptysis or leg swelling. She's also noticed increased desaturations to 88% at home but here walking desaturation test she did get dyspneic 185 feet 3 laps on room air. She had more dyspnea than usual but she did not desaturate below 92% but did get tachycardic. She does feel better since yesterday   Current outpatient prescriptions:  .  acetaminophen (TYLENOL) 325 MG tablet, Take 650 mg by mouth every 6 (six) hours as needed., Disp: , Rfl:  .  alendronate (FOSAMAX) 70 MG tablet, Take 1 tablet (70 mg total) by mouth every 7 (seven) days. Take with a full glass of water on an empty stomach. (Patient taking differently: Take 70 mg by mouth every 7 (seven) days. Sunday), Disp: 12 tablet, Rfl: 0 .  Alpha-D-Galactosidase (BEANO PO), Take 1 tablet by mouth as needed (if eating onion or garlic). , Disp: , Rfl:  .  Alum Hydroxide-Mag Carbonate (GAVISCON PO), Take 1 tablet by mouth as needed (bad GI distress). , Disp: , Rfl:  .  Ascorbic Acid (VITAMIN C) 1000 MG tablet, Take 1,000 mg by mouth daily., Disp: , Rfl:  .  calcium carbonate (TUMS - DOSED IN MG ELEMENTAL CALCIUM) 500  MG chewable tablet, Chew 2 tablets by mouth daily., Disp: , Rfl:  .  cetirizine (ZYRTEC) 10 MG tablet, Take 1 tablet (10 mg total) by mouth daily., Disp: 90 tablet, Rfl: 3 .  chlorpheniramine (CHLOR-TRIMETON) 4 MG tablet, Take 4 mg by mouth daily as needed for allergies. , Disp: , Rfl:  .  dicyclomine (BENTYL) 10 MG capsule, Take 10 mg by mouth 3 (three) times daily as needed for spasms (stomach cramping)., Disp: , Rfl:  .  fluticasone (FLONASE) 50 MCG/ACT nasal spray, Place into both nostrils daily., Disp: , Rfl:  .  lactase (LACTAID) 3000 UNITS tablet, Take 3,000 Units by mouth as needed (when eating ice cream or dariy). , Disp: , Rfl:  .  metoprolol succinate (TOPROL-XL) 50 MG 24 hr tablet, TAKE 1 TABLET BY MOUTH EVERY DAY IMMEDIATELY FOLLOWING A MEAL., Disp: 5 tablet, Rfl: 0 .  Multiple Vitamins-Minerals (AIRBORNE PO), Take 1 tablet by mouth daily. , Disp: , Rfl:  .  omeprazole (PRILOSEC) 20 MG capsule, Take 1 capsule (20 mg total) by mouth daily., Disp: 30 capsule, Rfl: 5 .  pravastatin (PRAVACHOL) 40 MG tablet, Take 0.5 tablets (20 mg total) by mouth daily., Disp: 135 tablet, Rfl: 0 .  predniSONE (DELTASONE) 5 MG tablet, Take 1 tablet (5 mg total) by mouth daily with breakfast., Disp: 90 tablet, Rfl: 2 .  Probiotic Product (PROBIOTIC DAILY PO), Take by mouth. Take 2 gummies daily, Disp: ,  Rfl:  .  traMADol (ULTRAM) 50 MG tablet, Take 1-2 tablets (50-100 mg total) by mouth every 6 (six) hours as needed for moderate pain or severe pain., Disp: 60 tablet, Rfl: 0  Allergies  Allergen Reactions  . Atorvastatin Other (See Comments)    Leg pain  . Betadine [Povidone Iodine] Other (See Comments)    blisters  . Codeine Nausea And Vomiting  . Rosuvastatin Other (See Comments)    Leg pain  . Shellfish Allergy Other (See Comments)    vomiting  . Sulfonamide Derivatives Other (See Comments)    headaches     Review of Systems According to history of present illness    Objective:   Physical  Exam  Constitutional: She is oriented to person, place, and time. She appears well-developed and well-nourished. No distress.  HENT:  Head: Normocephalic and atraumatic.  Right Ear: External ear normal.  Left Ear: External ear normal.  Mouth/Throat: Oropharynx is clear and moist. No oropharyngeal exudate.  Eyes: Conjunctivae and EOM are normal. Pupils are equal, round, and reactive to light. Right eye exhibits no discharge. Left eye exhibits no discharge. No scleral icterus.  Neck: Normal range of motion. Neck supple. No JVD present. No tracheal deviation present. No thyromegaly present.  Cardiovascular: Normal rate, regular rhythm, normal heart sounds and intact distal pulses.  Exam reveals no gallop and no friction rub.   No murmur heard. Pulmonary/Chest: Effort normal. No respiratory distress. She has no wheezes. She has rales. She exhibits no tenderness.  Crackles front and back as before  Abdominal: Soft. Bowel sounds are normal. She exhibits no distension and no mass. There is no tenderness. There is no rebound and no guarding.  Musculoskeletal: Normal range of motion. She exhibits no edema or tenderness.  Lymphadenopathy:    She has no cervical adenopathy.  Neurological: She is alert and oriented to person, place, and time. She has normal reflexes. No cranial nerve deficit. She exhibits normal muscle tone. Coordination normal.  Skin: Skin is warm and dry. No rash noted. She is not diaphoretic. No erythema. No pallor.  Psychiatric: She has a normal mood and affect. Her behavior is normal. Judgment and thought content normal.  Vitals reviewed.   Filed Vitals:   10/11/15 1427  BP: 120/68  Pulse: 86  Height: 5' (1.524 m)  Weight: 153 lb (69.4 kg)  SpO2: 96%        Assessment:       ICD-9-CM ICD-10-CM   1. Acute bronchitis, unspecified organism 466.0 J20.9   2. ILD (interstitial lung disease) ([place]) 515 J84.9   3. Hypersensitivity pneumonitis (HCC) 495.9 J67.9   4.  Right-sided chest wall pain 786.52 R07.89    She is having acute bronchitis. She is a little bit more dyspneic than baseline but she did not desaturate although she got more tachycardic than baseline. Some of this could be result of deconditioning post hip surgery October 2017. I do not see any evidence of deep vein thrombosis of pulmonary embolism clinically. Her right-sided chest wall pain could be due to muscle pull and is a recurrence of the previous pain and is of the same site of surgical lung biopsy for ILD. She's had a pneumothorax on the right side in the past and we will need to rule this out this time all the clinical problem at is low     Plan:      Plan - Cxr 2 view today - will call with results -Take prednisone 40 mg  daily x 2 days, then 30mg  daily x 2 days,  then 20mg  daily x 2 days, then 10mg  daily x 2 days, then 5mg  daily to continue baseline dose  - Take doxycycline 100mg  po twice daily x 5 days; take after meals and avoid sunlight - control urge to clear throat using   - drinking water  - swallow saliva  - nasal steroid and chlortrimeton  - sugarless lozenge  Followup - pft feb 2017 and then fu with Carolyn Sylvia  feb 2017; sooner if needed    Dr. Brand Males, M.D., [facility].C.P Pulmonary and Critical Care Medicine Staff Physician [place] [facility] Pager: [phone], If no answer or between  15:00h - 7:00h: call [phone]  10/11/2015 2:49 PM

## 2015-10-11 NOTE — Patient Instructions (Addendum)
ICD-9-CM ICD-10-CM   1. Acute bronchitis, unspecified organism 466.0 J20.9   2. ILD (interstitial lung disease) ([place]) 515 J84.9   3. Hypersensitivity pneumonitis (HCC) 495.9 J67.9   4. Right-sided chest wall pain 786.52 R07.89    Plan - Cxr 2 view today - will call with results -Take prednisone 40 mg daily x 2 days, then 30mg  daily x 2 days,  then 20mg  daily x 2 days, then 10mg  daily x 2 days, then 5mg  daily to continue baseline dose  - Take doxycycline 100mg  po twice daily x 5 days; take after meals and avoid sunlight - control urge to clear throat using   - drinking water  - swallow saliva  - nasal steroid and chlortrimeton  - sugarless lozenge  Followup - pft feb 2017 and then fu with Rhonda [name]  feb 2017; sooner if needed

## 2015-10-29 ENCOUNTER — Telehealth: Payer: Self-pay | Admitting: Internal Medicine

## 2015-10-29 DIAGNOSIS — R058 Other specified cough: Secondary | ICD-10-CM

## 2015-10-29 DIAGNOSIS — R05 Cough: Secondary | ICD-10-CM

## 2015-10-29 DIAGNOSIS — R0602 Shortness of breath: Secondary | ICD-10-CM

## 2015-10-29 NOTE — Telephone Encounter (Signed)
Orders placed in epic  Nothing further is needed at this time.

## 2015-10-29 NOTE — Telephone Encounter (Signed)
Patient called me . Reports that cough still persists and has white sputum. Dyspnea not different. Is bad. This is despite pred and zpak. She felt high dose pred worked well. She is frustrated. Worse when she sits up from bed. Also, has right sided pain in chest at suture site - not going away. LAst HRCT April 2016  Plan  get CT sinus wo contrast Get HRCT wo contrast - High Resolution CT chest without contrast on ILD protocol. Only  Dr Lorin Picket or Dr. Vinnie Langton to read  Will call with results  Thanks  Dr. Brand Males, M.D., [name].C.P Pulmonary and Critical Care Medicine Staff Physician [place] [facility] Pager: [phone], If no answer or between  15:00h - 7:00h: call [phone]  10/29/2015 3:53 PM

## 2015-10-30 ENCOUNTER — Telehealth: Payer: Self-pay | Admitting: Internal Medicine

## 2015-10-30 NOTE — Telephone Encounter (Signed)
Ok let me kniow if there is an insurance denial . IF so then dx have to be changed for sure

## 2015-10-30 NOTE — Telephone Encounter (Signed)
For Rhonda [name] for CT sinus and cT - please change the indication to Hypersensitivity pneumonitis, ILD, and chronic cough, post nasal drip - all of them for both CT

## 2015-10-30 NOTE — Telephone Encounter (Signed)
[facility] - these CT scans have already been scheduled, I cannot go in and delete the orders and re-enter to add the diagnoses below.   Do you have a way of adding these Dx to the order?  Please advise.

## 2015-10-30 NOTE — Telephone Encounter (Signed)
i dont know of anyway to do that either Joellen Jersey

## 2015-11-04 ENCOUNTER — Other Ambulatory Visit: Payer: Self-pay | Admitting: Physician Assistant

## 2015-11-05 NOTE — Telephone Encounter (Signed)
Dr Lorelei Pont, you saw pt for checkup 9/28, but don't see these meds discussed recently. Do you want to RF?

## 2015-11-07 ENCOUNTER — Ambulatory Visit
Admission: RE | Admit: 2015-11-07 | Discharge: 2015-11-07 | Disposition: A | Discharge status: Home / Self Care | Payer: Medicare Other | Source: Ambulatory Visit | Attending: Internal Medicine | Admitting: Internal Medicine

## 2015-11-07 DIAGNOSIS — R058 Other specified cough: Secondary | ICD-10-CM

## 2015-11-07 DIAGNOSIS — R0602 Shortness of breath: Secondary | ICD-10-CM

## 2015-11-07 DIAGNOSIS — R05 Cough: Secondary | ICD-10-CM

## 2015-11-08 ENCOUNTER — Telehealth: Payer: Self-pay | Admitting: Internal Medicine

## 2015-11-08 ENCOUNTER — Other Ambulatory Visit: Payer: Self-pay

## 2015-11-08 ENCOUNTER — Telehealth: Payer: Self-pay

## 2015-11-08 DIAGNOSIS — E2839 Other primary ovarian failure: Secondary | ICD-10-CM

## 2015-11-08 DIAGNOSIS — J849 Interstitial pulmonary disease, unspecified: Secondary | ICD-10-CM

## 2015-11-08 DIAGNOSIS — Z1231 Encounter for screening mammogram for malignant neoplasm of breast: Secondary | ICD-10-CM

## 2015-11-08 NOTE — Telephone Encounter (Signed)
Called MIHAELA LEHMKUHL - left missed call. To discuss ct results. Will call again later after weekend. Would like to try a steroid burst for 3 weeks  Ct Chest High Resolution  11/07/2015  CLINICAL DATA:  64 year old female with history of cough for 2 months. History of pulmonary fibrosis. EXAM: CT CHEST WITHOUT CONTRAST TECHNIQUE: Multidetector CT imaging of the chest was performed following the standard protocol without intravenous contrast. High resolution imaging of the lungs, as well as inspiratory and expiratory imaging, was performed. COMPARISON:  Chest CT 02/22/2015. FINDINGS: Mediastinum/Lymph Nodes: Heart size is normal. There is no significant pericardial fluid, thickening or pericardial calcification. There is atherosclerosis of the thoracic aorta, the great vessels of the mediastinum and the coronary arteries, including calcified atherosclerotic plaque in the left anterior descending, left circumflex and right coronary arteries. Multiple borderline enlarged and minimally enlarged mediastinal and hilar lymph nodes are noted, measuring up to 11 mm in the low right paratracheal nodal station. Esophagus is unremarkable in appearance. No axillary lymphadenopathy. Lungs/Pleura: High-resolution images again demonstrate patchy areas of ground-glass attenuation, associated with septal thickening, profound thickening of the peribronchovascular interstitium with patchy areas of cylindrical and mild varicose bronchiectasis. No honeycombing is identified. Findings do not have a clearly definable craniocaudal gradient. Inspiratory and expiratory imaging demonstrates extensive air trapping, indicative of small airways disease. Overall, findings appear to be slightly progressive to prior study 02/22/2015. No acute consolidative airspace disease. No pleural effusions. No suspicious appearing pulmonary nodules or masses are noted. Postoperative changes suggestive of open lung biopsy are noted in the periphery of the  right lower lobe. Upper abdomen: Unremarkable. Musculoskeletal: There are no aggressive appearing lytic or blastic lesions noted in the visualized portions of the skeleton. IMPRESSION: 1. Chronic changes of interstitial lung disease appears slightly progressive compared to the prior study. The findings are again most strongly favored to represent progressively worsening chronic hypersensitivity pneumonitis. 2. Atherosclerosis, including 3 vessel coronary artery disease. Please note that although the presence of coronary artery calcium documents the presence of coronary artery disease, the severity of this disease and any potential stenosis cannot be assessed on this non-gated CT examination. Assessment for potential risk factor modification, dietary therapy or pharmacologic therapy may be warranted, if clinically indicated. 3. Additional incidental findings, as above. Electronically Signed   By: Vinnie Langton M.D.   On: 11/07/2015 16:59   Euharlee Cm  11/07/2015  CLINICAL DATA:  Cough for 2 months with history of pulmonary fibrosis and postnasal drainage and congestion EXAM: CT PARANASAL SINUS LIMITED WITHOUT CONTRAST TECHNIQUE: Non-contiguous Multidetector CT images of the paranasal sinuses were obtained in a single plane without contrast. COMPARISON:  None. FINDINGS: Frontal, ethmoid, sphenoid, and maxillary sinuses are clear. No significant mucosal periosteal thickening. No air-fluid levels or opacification. Mild leftward nasal septum deviation. IMPRESSION: No significant evidence of sinusitis. Electronically Signed   By: Skipper Cliche M.D.   On: 11/07/2015 17:24

## 2015-11-08 NOTE — Telephone Encounter (Signed)
Dr. Lorelei Pont   It is time for bone density test per [facility].  Please referral for patient.   She can schedule it herself after the referral has been done.  Please call   [phone]

## 2015-11-08 NOTE — Telephone Encounter (Signed)
Please sign order

## 2015-11-11 MED ORDER — PREDNISONE 10 MG PO TABS: ORAL_TABLET | ORAL | Status: DC

## 2015-11-11 NOTE — Telephone Encounter (Signed)
Gave results to patient  Please do 3 weeks pred taper as follow  Please take Take prednisone  50mg  daily x 4 days, then 40mg  once daily x 4 days, then 30mg  once daily x 4 days, then 20mg  once daily x 4 days, then prednisone 10mg  once daily  x 4 days and then 5mg  daily to continue  Do PFT end jan 2017 - she can call me for results  Dr. Brand Males, M.D., [facility].C.P Pulmonary and Critical Care Medicine Staff Physician [place] [facility] Pager: [phone], If no answer or between  15:00h - 7:00h: call [phone]  11/11/2015 11:23 AM

## 2015-11-11 NOTE — Telephone Encounter (Signed)
Pt called office back and was informed of MR rec  Order for prednisone called into pt's pharmacy per pt request   Nothing further is needed

## 2015-12-09 ENCOUNTER — Ambulatory Visit
Admission: RE | Admit: 2015-12-09 | Discharge: 2015-12-09 | Disposition: A | Discharge status: Home / Self Care | Payer: Medicare Other | Source: Ambulatory Visit

## 2015-12-09 DIAGNOSIS — Z1231 Encounter for screening mammogram for malignant neoplasm of breast: Secondary | ICD-10-CM

## 2015-12-12 DIAGNOSIS — M85851 Other specified disorders of bone density and structure, right thigh: Secondary | ICD-10-CM | POA: Diagnosis not present

## 2015-12-13 ENCOUNTER — Encounter: Payer: Self-pay | Admitting: Family Medicine

## 2015-12-16 ENCOUNTER — Ambulatory Visit (INDEPENDENT_AMBULATORY_CARE_PROVIDER_SITE_OTHER): Payer: Medicare Other | Admitting: Internal Medicine

## 2015-12-16 DIAGNOSIS — J849 Interstitial pulmonary disease, unspecified: Secondary | ICD-10-CM | POA: Diagnosis not present

## 2015-12-16 LAB — PULMONARY FUNCTION TEST
DL/VA % PRED: 105 %
DL/VA: 4.29 ml/min/mmHg/L
DLCO UNC: 10.06 ml/min/mmHg
DLCO unc % pred: 57 %
FEF 25-75 POST: 2.54 L/s
FEF 25-75 PRE: 2.03 L/s
FEF2575-%Change-Post: 25 %
FEF2575-%PRED-PRE: 114 %
FEF2575-%Pred-Post: 143 %
FEV1-%Change-Post: 8 %
FEV1-%PRED-POST: 72 %
FEV1-%Pred-Pre: 66 %
FEV1-Post: 1.39 L
FEV1-Pre: 1.28 L
FEV1FVC-%Change-Post: 0 %
FEV1FVC-%Pred-Pre: 116 %
FEV6-%CHANGE-POST: 8 %
FEV6-%PRED-POST: 64 %
FEV6-%PRED-PRE: 59 %
FEV6-POST: 1.54 L
FEV6-Pre: 1.42 L
FEV6FVC-%PRED-POST: 105 %
FEV6FVC-%Pred-Pre: 105 %
FVC-%Change-Post: 8 %
FVC-%Pred-Post: 61 %
FVC-%Pred-Pre: 56 %
FVC-Post: 1.54 L
FVC-Pre: 1.42 L
POST FEV6/FVC RATIO: 100 %
PRE FEV1/FVC RATIO: 90 %
Post FEV1/FVC ratio: 90 %
Pre FEV6/FVC Ratio: 100 %
RV % pred: 75 %
RV: 1.41 L
TLC % PRED: 66 %
TLC: 2.86 L

## 2015-12-16 NOTE — Progress Notes (Signed)
PFT done today. 

## 2015-12-17 DIAGNOSIS — M1612 Unilateral primary osteoarthritis, left hip: Secondary | ICD-10-CM | POA: Diagnosis not present

## 2015-12-17 DIAGNOSIS — M47816 Spondylosis without myelopathy or radiculopathy, lumbar region: Secondary | ICD-10-CM | POA: Diagnosis not present

## 2015-12-18 ENCOUNTER — Encounter: Payer: Self-pay | Admitting: Family Medicine

## 2015-12-19 DIAGNOSIS — M25552 Pain in left hip: Secondary | ICD-10-CM | POA: Diagnosis not present

## 2015-12-19 DIAGNOSIS — M1612 Unilateral primary osteoarthritis, left hip: Secondary | ICD-10-CM | POA: Diagnosis not present

## 2015-12-25 ENCOUNTER — Telehealth: Payer: Self-pay | Admitting: Internal Medicine

## 2015-12-25 NOTE — Telephone Encounter (Signed)
Rhonda  [name] 01/07/1951  and find out if she is more dyspneic than before. PFTs some elements are showing decline. She has fu end of feb 2017 but I could see her earlier mid-feb 2017. And how much prednisone is she on?  Thanks  Dr. Brand Males, M.D., [facility].C.P Pulmonary and Critical Care Medicine Staff Physician [place] [facility] Pager: [phone], If no answer or between  15:00h - 7:00h: call [phone]  12/25/2015 1:52 AM

## 2015-12-27 ENCOUNTER — Encounter: Payer: Self-pay | Admitting: Family Medicine

## 2015-12-27 NOTE — Telephone Encounter (Signed)
lmtcb for pt.  

## 2015-12-31 NOTE — Telephone Encounter (Signed)
Ok thanks. Wil lsee her 01/13/16. Not sure if you need to tell her this. I have closed doc. No need to reply to me

## 2015-12-31 NOTE — Telephone Encounter (Signed)
Spoke with pt. States she is not any more short of breath than normal. Currently taking 5mg  of prednisone a day. Has upcoming appointment on 2/2/017, she will keep this appointment.

## 2015-12-31 NOTE — Telephone Encounter (Signed)
Patient returned call, asked that you call her cell [phone].

## 2016-01-06 ENCOUNTER — Encounter: Payer: Self-pay | Admitting: Pediatrics

## 2016-01-06 ENCOUNTER — Ambulatory Visit (INDEPENDENT_AMBULATORY_CARE_PROVIDER_SITE_OTHER): Payer: Medicare Other | Admitting: Pediatrics

## 2016-01-06 VITALS — BP 130/70 | HR 80 | Temp 97.8°F | Resp 16 | Ht 59.84 in | Wt 156.1 lb

## 2016-01-06 DIAGNOSIS — J4530 Mild persistent asthma, uncomplicated: Secondary | ICD-10-CM | POA: Insufficient documentation

## 2016-01-06 DIAGNOSIS — K219 Gastro-esophageal reflux disease without esophagitis: Secondary | ICD-10-CM | POA: Insufficient documentation

## 2016-01-06 DIAGNOSIS — Z79899 Other long term (current) drug therapy: Secondary | ICD-10-CM | POA: Insufficient documentation

## 2016-01-06 DIAGNOSIS — R000 Tachycardia, unspecified: Secondary | ICD-10-CM | POA: Insufficient documentation

## 2016-01-06 DIAGNOSIS — J301 Allergic rhinitis due to pollen: Secondary | ICD-10-CM | POA: Diagnosis not present

## 2016-01-06 DIAGNOSIS — T7800XA Anaphylactic reaction due to unspecified food, initial encounter: Secondary | ICD-10-CM | POA: Diagnosis not present

## 2016-01-06 DIAGNOSIS — Z91018 Allergy to other foods: Secondary | ICD-10-CM | POA: Insufficient documentation

## 2016-01-06 MED ORDER — FLUTICASONE PROPIONATE 50 MCG/ACT NA SUSP: 2.0000 | Freq: Every day | NASAL | Status: DC

## 2016-01-06 MED ORDER — ALBUTEROL SULFATE HFA 108 (90 BASE) MCG/ACT IN AERS: 2.0000 | INHALATION_SPRAY | RESPIRATORY_TRACT | Status: DC | PRN

## 2016-01-06 MED ORDER — MONTELUKAST SODIUM 10 MG PO TABS: 10.0000 mg | ORAL_TABLET | Freq: Every day | ORAL | Status: DC

## 2016-01-06 NOTE — Patient Instructions (Addendum)
Environmental control of dust mite and mold Cetirizine 10 mg once a day Nasal saline irrigations at night followed by fluticasone 2 sprays per nostril at night Montelukast  10 mg once a day for cough or wheeze Pro-air-2 puffs every 4 hours if needed for wheezing or coughing spells. You may use 2 puffs 5-10 minutes before exercise Continue avoiding shellfish  Continue on her other medications

## 2016-01-06 NOTE — Progress Notes (Signed)
[address] [place] [address] Dept: [phone]  New Patient Note  Patient ID: Rhonda [name], female    DOB: [birth year]  Age: 65 y.o. MRN: [id number] Date of Office Visit: 01/06/2016 Referring provider: Darreld Mclean, MD [address] [place], [place] [address]    Chief Complaint: Cough and Nasal Congestion  HPI BRIN HAUPTMAN presents for evaluation of a cough since November 2016. She has a history of asthma. She has pulmonary fibrosis secondary to hypersensitivity pneumonitis. She used to have birds. Her pulmonary fibrosis was diagnosed after an episode of pneumonia 6 years ago. She does have some shortness of breath with exercise when she swims for aerobic reasons. She has been on 2 rounds of high-dose prednisone for the  since November. She takes prednisone 5 mg once a day for her pulmonary fibrosis. She used to be on allergy injections several years ago and her symptoms improved significantly. She has aggravation of her symptoms on exposure to dust and cats and leaves in the fall of the year. She had a normal sinus CT recently. Her pulmonary fibrosis was diagnosed by lung biopsy and chest CT. She is referred by her pulmonary specialist  Review of Systems  Constitutional: Negative.   HENT:       Nasal congestion off and on for several years  Eyes: Negative.   Respiratory:       History of asthma. Pulmonary fibrosis following hypersensitivity pneumonitis of unknown cause  Cardiovascular:       Tachycardia requiring beta blocker use  Gastrointestinal:       Gastroesophageal reflux, hiatal hernia, IBS  Genitourinary: Negative.   Musculoskeletal: Negative.   Skin: Negative.   Neurological: Negative.   Endo/Heme/Allergies:       She is on Fosamax to prevent osteoporosis  Psychiatric/Behavioral: Negative.     Outpatient Encounter Prescriptions as of 01/06/2016  Medication Sig  . acetaminophen (TYLENOL) 325 MG tablet Take 650 mg by mouth every 6 (six) hours  as needed.  Marland Kitchen alendronate (FOSAMAX) 70 MG tablet TAKE 1 TABLET BY MOUTH EVERY 7 DAYS. TAKE W/ A FULL GLASS OF WATER ONANEMPTY STOMACH  . Alpha-D-Galactosidase (BEANO PO) Take 1 tablet by mouth as needed (if eating onion or garlic).   . Alum Hydroxide-Mag Carbonate (GAVISCON PO) Take 1 tablet by mouth as needed (bad GI distress).   . Ascorbic Acid (VITAMIN C) 1000 MG tablet Take 1,000 mg by mouth daily.  . calcium carbonate (TUMS - DOSED IN MG ELEMENTAL CALCIUM) 500 MG chewable tablet Chew 2 tablets by mouth daily.  . cetirizine (ZYRTEC) 10 MG tablet Take 1 tablet (10 mg total) by mouth daily.  . chlorpheniramine (CHLOR-TRIMETON) 4 MG tablet Take 4 mg by mouth daily as needed for allergies.   . fluticasone (FLONASE) 50 MCG/ACT nasal spray Place 1 spray into both nostrils daily.   Marland Kitchen lactase (LACTAID) 3000 UNITS tablet Take 3,000 Units by mouth as needed (when eating ice cream or dariy).   . metoprolol succinate (TOPROL-XL) 50 MG 24 hr tablet TAKE 1 TABLET BY MOUTH EVERY DAY IMMEDIATELY FOLLOWING A MEAL.  Marland Kitchen omeprazole (PRILOSEC) 20 MG capsule Take 1 capsule (20 mg total) by mouth daily.  . pravastatin (PRAVACHOL) 40 MG tablet Take 0.5 tablets (20 mg total) by mouth daily.  . predniSONE (DELTASONE) 5 MG tablet Take 1 tablet (5 mg total) by mouth daily with breakfast.  . Probiotic Product (PROBIOTIC DAILY PO) Take by mouth. Take 2 gummies daily  . albuterol (PROAIR HFA)  108 (90 Base) MCG/ACT inhaler Inhale 2 puffs into the lungs every 4 (four) hours as needed for wheezing. Or coughing spells.  You may use 2 Puffs 5-10 minutes before exercise.  . dicyclomine (BENTYL) 10 MG capsule Take 10 mg by mouth 3 (three) times daily as needed for spasms (stomach cramping). Reported on 01/06/2016  . doxycycline (VIBRA-TABS) 100 MG tablet Take 1 tablet, twice daily for 5 days. Avoid sunlight and take after meals. (Patient not taking: Reported on 01/06/2016)  . fluticasone (FLONASE) 50 MCG/ACT nasal spray Place 2 sprays  into both nostrils daily. Use 2 sprays per nostril at night.  . metoprolol succinate (TOPROL-XL) 50 MG 24 hr tablet TAKE 1 TABLET BY MOUTH EVERY DAY IMMEDIATELY FOLLOWING A MEAL.  . montelukast (SINGULAIR) 10 MG tablet Take 1 tablet (10 mg total) by mouth daily.  . Multiple Vitamins-Minerals (AIRBORNE PO) Take 1 tablet by mouth daily.   . predniSONE (DELTASONE) 10 MG tablet 40 mg x2 day, then 30 mg x2 day, then 20 mg x2 day, then 10 mg x2 day, and then 5 mg x2 day and stop (Patient not taking: Reported on 01/06/2016)  . predniSONE (DELTASONE) 10 MG tablet Take 5 tablets by mouth x 4 days,  4 tablets by mouth x 4 days 3 tablets by mouth x 4 days,  2 tablets by mouth x 4 days  1 tablet by mouth x 4 days, stay on 1/2 tablet until return visit (Patient not taking: Reported on 01/06/2016)  . traMADol (ULTRAM) 50 MG tablet Take 1-2 tablets (50-100 mg total) by mouth every 6 (six) hours as needed for moderate pain or severe pain. (Patient not taking: Reported on 01/06/2016)   No facility-administered encounter medications on file as of 01/06/2016.     Drug Allergies:  Allergies  Allergen Reactions  . Atorvastatin Other (See Comments)    Leg pain  . Betadine [Povidone Iodine] Other (See Comments)    blisters  . Codeine Nausea And Vomiting  . Rosuvastatin Other (See Comments)    Leg pain  . Shellfish Allergy Other (See Comments)    vomiting  . Sulfonamide Derivatives Other (See Comments)    headaches    Family History: Rhonda [name]'s family history includes Allergic rhinitis in her daughter; Asthma in her maternal grandmother and mother; Bone cancer in her paternal grandfather; Breast cancer in her paternal aunt; Dementia (age of onset: 32) in her mother; Diabetes in her father; Emphysema in her maternal grandmother; Heart disease in her sister; Hypertension in her sister; Kidney disease in her sister; Lung disease in her maternal grandfather; Lymphoma in her father; Osteoarthritis in her mother; Ovarian  cancer in her maternal aunt. There is no history of Colon cancer, Angioedema, Eczema, Immunodeficiency, or Urticaria..  Social and environmental. She has 2 dogs in the home. She is not exposed to cigarette smoking. She is retired.  Physical Exam: BP 130/70 mmHg  Pulse 80  Temp(Src) 97.8 F (36.6 C) (Oral)  Resp 16  Ht 4' 11.84" (1.52 m)  Wt 156 lb 1.4 oz (70.8 kg)  BMI 30.64 kg/m2   Physical Exam  Constitutional: She is oriented to person, place, and time. She appears well-developed and well-nourished.  HENT:  Eyes normal. Ears normal. Nose moderate swelling of  turbinates with clear nasal discharge. Pharynx normal.  Neck: Neck supple. No thyromegaly present.  Cardiovascular:  S1 and S2 normal no murmurs  Pulmonary/Chest:  Clear to percussion auscultation  Abdominal: Soft. There is no tenderness (no hepatosplenomegaly).  Musculoskeletal:  Normal range of motion.  Neurological: She is alert and oriented to person, place, and time.  Skin:  Clear  Psychiatric: She has a normal mood and affect. Her behavior is normal. Judgment and thought content normal.  Vitals reviewed.   Diagnostics: Allergy skin tests were positive to grass pollens, weeds pollen, tree pollens, molds, dust mites, cat, horse, cockroach. Slight reactivity noted to dog. Slight reactivity noted to shrimp  FVC 1.66 L FEV1 1.24 L. Predicted FVC 2.44 L predicted FEV1 1.97 L. After albuterol 2 puffs FVC 1.32 L FEV1 1.14 L-E FVC shows a mild reduction in the forced vital capacity. There was no significant improvement after albuterol but she is on prednisone 5 mg once a day which would keep some asthmatic obstruction under control .Marland Kitchen She felt better after albuterol   Assessment Assessment and Plan: 1. Allergic rhinitis due to pollen   2. Mild persistent asthma, uncomplicated   3. Allergy with anaphylaxis due to food, initial encounter   4. Current use of beta blocker   5. Gastroesophageal reflux disease without  esophagitis   6. Tachycardia     Meds ordered this encounter  Medications  . montelukast (SINGULAIR) 10 MG tablet    Sig: Take 1 tablet (10 mg total) by mouth daily.    Dispense:  30 tablet    Refill:  5  . fluticasone (FLONASE) 50 MCG/ACT nasal spray    Sig: Place 2 sprays into both nostrils daily. Use 2 sprays per nostril at night.    Dispense:  17 g    Refill:  5  . albuterol (PROAIR HFA) 108 (90 Base) MCG/ACT inhaler    Sig: Inhale 2 puffs into the lungs every 4 (four) hours as needed for wheezing. Or coughing spells.  You may use 2 Puffs 5-10 minutes before exercise.    Dispense:  1 Inhaler    Refill:  3    Patient Instructions  Environmental control of dust mite and mold Cetirizine 10 mg once a day Nasal saline irrigations at night followed by fluticasone 2 sprays per nostril at night Montelukast  10 mg once a day for cough or wheeze Pro-air-2 puffs every 4 hours if needed for wheezing or coughing spells. You may use 2 puffs 5-10 minutes before exercise Continue avoiding shellfish  Continue on her other medications      Return in about 4 weeks (around 02/03/2016).   Thank you for the opportunity to care for this patient.  Please do not hesitate to contact me with questions.  Penne Lash, M.D.  [facility] [address] [place], [place] [address] [phone]

## 2016-01-13 ENCOUNTER — Ambulatory Visit (INDEPENDENT_AMBULATORY_CARE_PROVIDER_SITE_OTHER): Payer: Medicare Other | Admitting: Internal Medicine

## 2016-01-13 ENCOUNTER — Encounter: Payer: Self-pay | Admitting: Internal Medicine

## 2016-01-13 VITALS — BP 118/68 | HR 84 | Ht 59.0 in | Wt 156.2 lb

## 2016-01-13 DIAGNOSIS — R053 Chronic cough: Secondary | ICD-10-CM

## 2016-01-13 DIAGNOSIS — J679 Hypersensitivity pneumonitis due to unspecified organic dust: Secondary | ICD-10-CM | POA: Diagnosis not present

## 2016-01-13 DIAGNOSIS — J387 Other diseases of larynx: Secondary | ICD-10-CM | POA: Diagnosis not present

## 2016-01-13 DIAGNOSIS — R05 Cough: Secondary | ICD-10-CM

## 2016-01-13 DIAGNOSIS — J849 Interstitial pulmonary disease, unspecified: Secondary | ICD-10-CM

## 2016-01-13 DIAGNOSIS — R0789 Other chest pain: Secondary | ICD-10-CM

## 2016-01-13 LAB — NITRIC OXIDE: Nitric Oxide: 10

## 2016-01-13 MED ORDER — GABAPENTIN 300 MG PO CAPS: 300.0000 mg | ORAL_CAPSULE | Freq: Three times a day (TID) | ORAL | Status: DC

## 2016-01-13 NOTE — Patient Instructions (Addendum)
ICD-9-CM ICD-10-CM   1. Chronic cough 786.2 R05 Nitric oxide  2. ILD (interstitial lung disease) ([facility]) 515 J84.9   3. Hypersensitivity pneumonitis ([facility]) 495.9 J67.9   4. Right-sided chest wall pain 786.52 R07.89     I I think is overwhelming evidence of cough neuropathy or irritable larynx syndrome Pulmonary fibrosis itself might not be worse but we are not sure at this point No evidence of asthma based on eosinophilic test exhaled nitric oxide  Plan - Take gabapentin 300mg  once daily x 5 days, then 300mg  twice daily x 5 days, then 300mg  three times daily to continue. If this makes you too sleepy or drowsy call us and we will cut your medication dosing down - Refer voice neuro rehabilitation Mr Abel Presto  - Use sugarless lozenges as needed - Total voice rest without speaking or whispering for 2-3 days sometime in the next 1 or 2 weeks - Anytime you have the urge to cough drink water\ - Overall try to talk less  - Continue allergy and daily prednisone for pulmonary fibrosis  Follow-up - Return to see me in 4-6 weeks or sooner if needed

## 2016-01-13 NOTE — Progress Notes (Signed)
Subjective:     Patient ID: Rhonda [name], female   DOB: 09/19/1951, 65 y.o.   MRN: [id number]  HPI   #GE reflux with small hiatal hernia  - on ppi  #MEtabolic Syndrome  - On low glycemic diet  159#  on 09/19/12 147# on 11/21/12 145# on 01/02/13 145# on 02/28/2013 147# - on 05/25/2013 154# - 09/18/14   #History of rapid heart rate not otherwise specified  - Start her on Lopressor  2008/2009 by primary care physician. History appears to correlate with onset of respiratory issues  - refuses to dc this drug as of 2014 discussion   # Hypersensitivity Pneumonitis and ILD  - Potential etiologies - cockateil x 2 for 18 years through end 2012. 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 Denies mold but lives in house built in [place] with a "weird baselment: and has humidifier  - first noted on CT chest 10/15/09 following trip to [place] (PE negative) but not described in 2003 CT chest report  - autoimmune panel: 10/23/11: Negative  -  Uderwennt bronch 11/19/11  - non diagnostic  - VATS Nov 2013 (done after initially refusing)- [place]. However, there is worrying trend of UIP pattern in the Upper lobes.            Oct/Nov 2012  March 2013 08/11/12  Nov/Dec 2013 Dx HP/UIP 01/02/2013  02/28/13 05/25/2013  12/06/2013  03/15/2014  06/29/2014  10'27/15 12/19/2014  02/26/15 10/11/2015  01/13/2016   Symp/Signs Dyspnea x5y  New crackles  dimished dyspnea except at hill. Improved crackles.  Improved dyspnea except @ hill           Fev1  1.9 L   1.7L/90%  1.66 L/87%   1.66 L/78%  1.75L/90% 1.68L/82% 1.56/75 1.6L/78% 1.48/75% 1.58L/77%  1.52L/78%  1.39/72% (jan)  FVC  2.6 L    2.2 L/82%   2.1 L/80%   2.19 L/82%  2.2L/91% 2.1/81% 1.86/69% 1.9L/71% 1.86/75% 1.75L/65% 1.82/73% (dec 2015 offce spiro) 2.84L/72%  1.54/61%  Ratio     79   76/95%  80/99% 80/100%  84  90      fef 25-75%                  TLC  4.3 L    3.3 L/79%  3.5 L/82%     3.22/72% 3.06L/68%  3.06/68%  3.78/87%  2.86/66%  DLCO  10/50%    10/51%   9.6/51%     11.9/63% 11.7/62%  11/561%  5.5/31%  10.06/57%  Walk test 185 ft x 3 laps on RA  rest 92%. Pk exertion 90%. Pk HR 108 Rest 92%. Pk exertion 86% at 2.5 laps. Pk HR 102  pk exertion - 88% Rest 96%. Pk 95%. Pk HR is 75. Done at full dose lopressor Rest 94%. Pk 91%. Rest HR 93 with pk HR 103/min. Done at half dose lopressor REst 96%. Pk pulse ox 92%. Rest HR 65. Pk HR 86 Did not desat. Normal Pk HR  Pulse ox 96%, droped to89% at 2nd lap but bounced up and ended at 91% 3rd lap Did not desat Did not desat, Pk HHR 93  Pulse ox low 92%, pk HR 107 ;llowest pulse ox 90% at 2nd lap but she held her breath, then 92% at 3rd lap  CT chest   unchanged            Ct mid  dec possibly worse - unsure  Tests/Bx Bronch 0- non diagnostic   VATS - HP with UIP in Upper lobe             RX    Rx pred start 11/23/12 On  pred 59m per day and will reduce to 2108mOn Pred 2082mer day On pred 59m97mr day On pred 5mg 63m day Rx pred 5mg d41my She reduced to pred 5mg x 159mt 1 month QOD due to GI concern side effects On pred 5mg qod61mt will increase to 5/d after this visit On pred 5mg dail52mOn pred 5mg daily30m                   OV 12/19/2014  Chief Complaint  Patient presents with  . Follow-up    Pt stated she is much improved since being sick. Pt c/o DOE d/t cold weather, prod cough with white mucus, PND and epigastric pain.     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 Now cough has improved to near baseline.  Stil bothered by the residual cough which seems to have significant neurogenic component (cleras throat, gag etc., ). Dyspnea on exertion is class 2 and stable compated to last visit with stable spirometry and walking desat test. However, husband feels over time pst few years dyspnea is  progressive. She is very afraid of even the mildes incline. Swimming poses problem for dyspnea and cough. Never been to rehab due to daily swimming. Now worried that dyspnea/cough can impact desire to travel abroad. Also, seems to be having non specific msk tupe right chest wall pain for many months.   Past, Family, Social reviewed: no change since last visit   OV 02/26/2015  Chief Complaint  Patient presents with  . Follow-up    Pt here after PFT and HRCT. Pt stated her breathing is better but her allergies are bothersome. Pt c/o prod cough with white mucus, sinus congestion, rhinorrhea. Pt stated her cough is better when she lays down.     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 AT that time big issue was severe cough which was felt largely cough hyperesnitvity due to fall 2015 viral infection. She says that slowly resolved. Wit that dyspnea improved too. [place] ever before. However, past week pollen level high and having cough again though not as bad. She is takng nasal steroid, OTC antihistamine x 2 and his helping. Dyspnea itself is stble. Cntinues pred 5mg per da39mAtt[facility]d [facility] [place]. 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 The chronic cough is resolved[place] Dyhspnea is mild , on exertion , relieved by rest and stable withoiut change. Continues prednisone 5mg per day29mhe is is due for Hip surgery with Dr Olin in Oct Alvan Dame. She has lot of questions that she wants me to talk to DR Olin about. Alvan Dameoke to him several days after this visit when he called and resolved questions. Overall low risk for pulmonary complications after hip surgery  Spiro FVC 1.8L and walking ddesat test - no desat in office  Suggests stability\   OV 10/11/2015  Chief  Complaint  Patient presents with  . Follow-up    Pt c/o increaes in SOB, desaturation with activity. Pt c/o recent cough with yellow mucus in morning and white mucus throughout the day. Pt c/o pain under right breast when twisting or bending over. Pt stated she had a right hip replacement.    follow-up interstitial lung disease due to hypersensitivity pneumonitis on baseline prednisone 5 mg per day  She had hip surgery 09/09/2015 and has had an uneventful postoperative course. She is ambulating slowly and her pain is resolved. 10 days ago started having worsening cough similar to 4 2015 when she had acute bronchitis which degenerated into chronic irritable larynx syndrome and chronic cough. Then some 3 days ago the sputum changed green in color associated with right-sided musculoskeletal chest pain at the site of surgical lung biopsy incision from the past. Of note she has a history of pneumothorax of the right side but a spontaneous but on the same side a surgical lung biopsy. No specific fever or hemoptysis or leg swelling. She's also noticed increased desaturations to 88% at home but here walking desaturation test she did get dyspneic 185 feet 3 laps on room air. She had more dyspnea than usual but she did not desaturate below 92% but did get tachycardic. She does feel better since yesterday  OV 01/13/2016  Chief Complaint  Patient presents with  . Follow-up    Pt c/o continued cough with clear/white mucus and SOB with exertion. Pt also c/o increasing pain under each breast that shoots through to her back.    Follow-up interstitial lung disease due to hypersensitivity pneumonitis on baseline prednisone 5 mg per day  Since last visit 3 months ago in November 2016 she continues to have severe chronic cough. This really has not gone away. It is completely consuming her quality of life. At last visit in mid November 2016 we did a day prednisone burst along with doxycycline but it did not help.  Following this did CT sinus that was normal early December 2016. Did high resolution CT chest and it suggested that the groundglass opacities with slightly prominent and she was possibly worse. Then in mid December 2016 as a result we did a 3 week prednisone burst. This again did not help the cough. Right at the end of this prednisone taper and gender 2017 did a pulmonary function test. The shows decline in her lung function but she does not feel decline in terms of dyspnea and she says that the PFT was hard because of her cough and she attributes the worsening due to cough and performance issues. Overall her dyspnea is unchanged and she is at baseline. When she climbs the stairs she desaturates to 85% at home but she says this is baseline.  Cough is the main thing that is consuming her. This cough is severe. Happens at the end of swimming when she does of breath hold. Also happens when she talks or laughs. Cough does not bother her when she sleeps and she is able to sleep through the night. Associated with significant sputum. This. It was mostly clear. There is a constant sensation of tickle in the throat or something in the throat. Her voice is hoarse periodically. Cough is also associated with significant right-sided musculoskeletal stabbing pain on the side of the biopsy. She has recently seen allergist  Dr Angelica Pou and she is allergic to many things including dust and mold. She says  there is no mold in the house anymore. She is now on Singulair and to antihistamines. But these have not helped. In fact she says that when she stopped taking her antihistamines for allergy testing the cough did not change at all. She questions the usefulness of these medications and helping her cough. She continues with acid reflux treatment unchanged. RSI cough score is 17 and reflects irritable larynx syndrome. Cough differentiator score performed and detailed below and suggests that she does indeed have irritable larynx  syndrome. Exhaled nitric oxide test today done in the office is normal and suggests he does not not have eosinophilic airway inflammation     Dr Lorenza Cambridge Reflux Symptom Index (> 13-15 suggestive of LPR cough) 01/13/2016   Hoarseness of problem with voice 3  Clearing  Of Throat 3  Excess throat mucus or feeling of post nasal drip 5  Difficulty swallowing food, liquid or tablets 0  Cough after eating or lying down 0  Breathing difficulties or choking episodes 1  Troublesome or annoying cough 4  Sensation of something sticking in throat or lump in throat 1  Heartburn, chest pain, indigestion, or stomach acid coming up 0  TOTAL 17      Kouffman Reflux v Neurogenic Cough Differentiator Reflux  Do you awaken from a sound sleep coughing violently?                            With trouble breathing? no  Do you have choking episodes when you cannot  Get enough air, gasping for air ?              no  Do you usually cough when you lie down into  The bed, or when you just lie down to rest ?                          no  Do you usually cough after meals or eating?         no  Do you cough when (or after) you bend over?    no  GERD SCORE  0  Kouffman Reflux v Neurogenic Cough Differentiator Neurogenic  Do you more-or-less cough all day long? yes  Does change of temperature make you cough? no  Does laughing or chuckling cause you to cough? yes  Do fumes (perfume, automobile fumes, burned  Toast, etc.,) cause you to cough ?      yes  Does speaking, singing, or talking on the phone cause you to cough   ?               yes  Neurogenic/Airway score 4      Current outpatient prescriptions:  .  acetaminophen (TYLENOL) 325 MG tablet, Take 650 mg by mouth every 6 (six) hours as needed., Disp: , Rfl:  .  albuterol (PROAIR HFA) 108 (90 Base) MCG/ACT inhaler, Inhale 2 puffs into the lungs every 4 (four) hours as needed for wheezing. Or coughing spells.  You may use 2 Puffs 5-10 minutes before  exercise., Disp: 1 Inhaler, Rfl: 3 .  alendronate (FOSAMAX) 70 MG tablet, TAKE 1 TABLET BY MOUTH EVERY 7 DAYS. TAKE W/ A FULL GLASS OF WATER ONANEMPTY STOMACH, Disp: 12 tablet, Rfl: 0 .  Alpha-D-Galactosidase (BEANO PO), Take 1 tablet by mouth as needed (if eating onion or garlic). , Disp: , Rfl:  .  Alum Hydroxide-Mag Carbonate (GAVISCON PO), Take 1  tablet by mouth as needed (bad GI distress). , Disp: , Rfl:  .  Ascorbic Acid (VITAMIN C) 1000 MG tablet, Take 1,000 mg by mouth daily., Disp: , Rfl:  .  calcium carbonate (TUMS - DOSED IN MG ELEMENTAL CALCIUM) 500 MG chewable tablet, Chew 2 tablets by mouth daily., Disp: , Rfl:  .  cetirizine (ZYRTEC) 10 MG tablet, Take 1 tablet (10 mg total) by mouth daily., Disp: 90 tablet, Rfl: 3 .  chlorpheniramine (CHLOR-TRIMETON) 4 MG tablet, Take 4 mg by mouth daily as needed for allergies. , Disp: , Rfl:  .  dicyclomine (BENTYL) 10 MG capsule, Take 10 mg by mouth 3 (three) times daily as needed for spasms (stomach cramping). Reported on 01/06/2016, Disp: , Rfl:  .  fluticasone (FLONASE) 50 MCG/ACT nasal spray, Place 2 sprays into both nostrils daily. Use 2 sprays per nostril at night., Disp: 17 g, Rfl: 5 .  lactase (LACTAID) 3000 UNITS tablet, Take 3,000 Units by mouth as needed (when eating ice cream or dariy). , Disp: , Rfl:  .  metoprolol succinate (TOPROL-XL) 50 MG 24 hr tablet, TAKE 1 TABLET BY MOUTH EVERY DAY IMMEDIATELY FOLLOWING A MEAL., Disp: 90 tablet, Rfl: 0 .  montelukast (SINGULAIR) 10 MG tablet, Take 1 tablet (10 mg total) by mouth daily., Disp: 30 tablet, Rfl: 5 .  Multiple Vitamin (MULTIVITAMIN) tablet, Take 1 tablet by mouth daily., Disp: , Rfl:  .  omeprazole (PRILOSEC) 20 MG capsule, Take 1 capsule (20 mg total) by mouth daily., Disp: 30 capsule, Rfl: 5 .  pravastatin (PRAVACHOL) 40 MG tablet, Take 0.5 tablets (20 mg total) by mouth daily., Disp: 135 tablet, Rfl: 0 .  predniSONE (DELTASONE) 5 MG tablet, Take 1 tablet (5 mg total) by mouth daily  with breakfast., Disp: 90 tablet, Rfl: 2 .  Probiotic Product (PROBIOTIC DAILY PO), Take by mouth. Take 2 gummies daily, Disp: , Rfl:  .  traMADol (ULTRAM) 50 MG tablet, Take 1-2 tablets (50-100 mg total) by mouth every 6 (six) hours as needed for moderate pain or severe pain., Disp: 60 tablet, Rfl: 0    Allergies  Allergen Reactions  . Atorvastatin Other (See Comments)    Leg pain  . Betadine [Povidone Iodine] Other (See Comments)    blisters  . Codeine Nausea And Vomiting  . Rosuvastatin Other (See Comments)    Leg pain  . Shellfish Allergy Other (See Comments)    vomiting  . Sulfonamide Derivatives Other (See Comments)    headaches    Immunization History  Administered Date(s) Administered  . Hepatitis A 05/23/2010  . Hepatitis B 06/23/2006  . Influenza Split 08/11/2012  . Influenza Whole 09/08/2011  . Influenza,inj,Quad PF,36+ Mos 09/07/2013  . Influenza-Unspecified 09/26/2014, 07/27/2015  . MMR 05/23/2010  . Pneumococcal Conjugate-13 03/15/2014  . Pneumococcal Polysaccharide-23 03/15/2014  . Td 02/22/2003  . Tdap 10/28/2012  . Zoster 12/04/2010      Review of Systems According to history of present illness    Objective:   Physical Exam  Constitutional: She is oriented to person, place, and time. She appears well-developed and well-nourished. No distress.  HENT:  Head: Normocephalic and atraumatic.  Right Ear: External ear normal.  Left Ear: External ear normal.  Mouth/Throat: Oropharynx is clear and moist. No oropharyngeal exudate.  Clears throat a lot Laryngeal quality of cough  Eyes: Conjunctivae and EOM are normal. Pupils are equal, round, and reactive to light. Right eye exhibits no discharge. Left eye exhibits no discharge. No scleral icterus.  Neck: Normal range of motion. Neck supple. No JVD present. No tracheal deviation present. No thyromegaly present.  Cardiovascular: Normal rate, regular rhythm, normal heart sounds and intact distal pulses.  Exam  reveals no gallop and no friction rub.   No murmur heard. Pulmonary/Chest: Effort normal. No respiratory distress. She has no wheezes. She has rales. She exhibits no tenderness.  Mild basal crackles only  Abdominal: Soft. Bowel sounds are normal. She exhibits no distension and no mass. There is no tenderness. There is no rebound and no guarding.  Musculoskeletal: Normal range of motion. She exhibits no edema or tenderness.  Lymphadenopathy:    She has no cervical adenopathy.  Neurological: She is alert and oriented to person, place, and time. She has normal reflexes. No cranial nerve deficit. She exhibits normal muscle tone. Coordination normal.  Skin: Skin is warm and dry. No rash noted. She is not diaphoretic. No erythema. No pallor.  Psychiatric: She has a normal mood and affect. Her behavior is normal. Judgment and thought content normal.  Vitals reviewed.  Filed Vitals:   01/13/16 1401  BP: 118/68  Pulse: 84  Height: _0  (1.499 m)  Weight: 156 lb 3.2 oz (70.852 kg)  SpO2: 93%         Assessment:       ICD-9-CM ICD-10-CM   1. Chronic cough 786.2 R05 Nitric oxide     Ambulatory Referral to Neuro Rehab  2. Irritable larynx syndrome 478.79 J38.7 Ambulatory Referral to Neuro Rehab  3. ILD (interstitial lung disease) ([place]) 515 J84.9   4. Hypersensitivity pneumonitis ([facility]) 495.9 J67.9   5. Right-sided chest wall pain 786.52 R07.89        Plan:       I I think is overwhelming evidence of cough neuropathy or irritable larynx syndrome Cough primary drivers could be her allergy, GERD (unlikely) and her ILD ILD itself might not be worse but we are not sure at this point No evidence of asthma based on eosinophilic test exhaled nitric oxide  Plan - Take gabapentin 318m once daily x 5 days, then 3038mtwice daily x 5 days, then 30073mhree times daily to continue. If this makes you too sleepy or drowsy call us [place] we will cut your medication dosing down - Refer voice neuro  rehabilitation Mr CarAbel Presto Use sugarless lozenges as needed - Total voice rest without speaking or whispering for 2-3 days sometime in the next 1 or 2 weeks - Anytime you have the urge to cough drink water\ - Overall try to talk less  - Continue allergy and daily prednisone for pulmonary fibrosis  Follow-up - Return to see me in 4-6 weeks or sooner if needed - depending on response might need to restarge ILD quicker with PFT and HRCT  She and husband Mr KinEdison Pacereeable with plan    > 50% of this > 25 min visit spent in face to face counseling or coordination of care   Dr. MurBrand Males.D., F.CThe Endoscopy Center LLCP Pulmonary and Critical Care Medicine Staff Physician [facility] Pager: [phone] no answer or between  15:00h - 7:00h: call [phone]  01/14/2016 5:36 PM

## 2016-01-30 ENCOUNTER — Other Ambulatory Visit: Payer: Self-pay | Admitting: Family Medicine

## 2016-01-31 NOTE — Telephone Encounter (Signed)
Last filled:  11/05/15 Amt: 90, 0 Last OV: 08/21/15  30 day supply given.  Pt needs an appt.

## 2016-02-03 ENCOUNTER — Ambulatory Visit: Payer: Medicare Other | Admitting: Pediatrics

## 2016-02-06 DIAGNOSIS — H35342 Macular cyst, hole, or pseudohole, left eye: Secondary | ICD-10-CM | POA: Diagnosis not present

## 2016-02-10 ENCOUNTER — Ambulatory Visit (INDEPENDENT_AMBULATORY_CARE_PROVIDER_SITE_OTHER): Payer: Medicare Other | Admitting: Pediatrics

## 2016-02-10 ENCOUNTER — Encounter: Payer: Self-pay | Admitting: Pediatrics

## 2016-02-10 VITALS — BP 126/66 | HR 76 | Temp 98.3°F | Resp 16

## 2016-02-10 DIAGNOSIS — K219 Gastro-esophageal reflux disease without esophagitis: Secondary | ICD-10-CM | POA: Diagnosis not present

## 2016-02-10 DIAGNOSIS — T7800XD Anaphylactic reaction due to unspecified food, subsequent encounter: Secondary | ICD-10-CM | POA: Diagnosis not present

## 2016-02-10 DIAGNOSIS — J301 Allergic rhinitis due to pollen: Secondary | ICD-10-CM | POA: Diagnosis not present

## 2016-02-10 DIAGNOSIS — J4530 Mild persistent asthma, uncomplicated: Secondary | ICD-10-CM | POA: Diagnosis not present

## 2016-02-10 DIAGNOSIS — Z87898 Personal history of other specified conditions: Secondary | ICD-10-CM | POA: Diagnosis not present

## 2016-02-10 DIAGNOSIS — Z79899 Other long term (current) drug therapy: Secondary | ICD-10-CM | POA: Diagnosis not present

## 2016-02-10 MED ORDER — FLUTICASONE PROPIONATE 50 MCG/ACT NA SUSP: 2.0000 | Freq: Every day | NASAL | Status: DC

## 2016-02-10 MED ORDER — MONTELUKAST SODIUM 10 MG PO TABS: 10.0000 mg | ORAL_TABLET | Freq: Every day | ORAL | Status: DC

## 2016-02-10 MED ORDER — EPINEPHRINE 0.3 MG/0.3ML IJ SOAJ: 0.3000 mg | Freq: Once | INTRAMUSCULAR | Status: DC

## 2016-02-10 NOTE — Patient Instructions (Addendum)
We will see her next week to do a gradual oral challenge to crab Continue on your current medications  If you have an allergic reaction take Benadryl 50 mg every 4 hours and if you have life-threatening symptoms inject yourself with EpiPen 0.3 mg

## 2016-02-10 NOTE — Progress Notes (Signed)
[address] [place] [address] Dept: [phone]  FOLLOW UP NOTE  Patient ID: Rhonda [name], female    DOB: 1951/06/15  Age: 65 y.o. MRN: [id number] Date of Office Visit: 02/10/2016  Assessment Chief Complaint: Cough and Nasal Congestion  HPI Rhonda [name] presents for  follow-up of her allergic symptoms. She finds that montelukast 10 mg once a day has helped her aerobic performance when she swims .Marland Kitchen She would like to have a gradual oral challenge to shellfish since she is showing minimal reactivity at this time. Her major reactivity to shellfish 30 years ago was nausea and diarrhea. She is followed by pulmonary specialist for her pulmonary fibrosis secondary to hypersensitivity pneumonitis.  Current medications are montelukast  10 mg once a day, prednisone 5 mg once a day, loratadine 10 mg once a day, fluticasone 2 sprays per nostril once a day. Her other medications are outlined in the chart.   Drug Allergies:  Allergies  Allergen Reactions  . Atorvastatin Other (See Comments)    Leg pain  . Betadine [Povidone Iodine] Other (See Comments)    blisters  . Codeine Nausea And Vomiting  . Rosuvastatin Other (See Comments)    Leg pain  . Shellfish Allergy Other (See Comments)    vomiting  . Sulfonamide Derivatives Other (See Comments)    headaches    Physical Exam: BP 126/66 mmHg  Pulse 76  Temp(Src) 98.3 F (36.8 C) (Oral)  Resp 16   Physical Exam  Constitutional: She is oriented to person, place, and time. She appears well-developed and well-nourished.  HENT:  Eyes normal. Ears normal. Nose normal. Pharynx normal.  Neck: Neck supple.  Cardiovascular:  S1 and S2 normal no murmurs  Pulmonary/Chest:  Clear to percussion and auscultation  Lymphadenopathy:    She has no cervical adenopathy.  Neurological: She is alert and oriented to person, place, and time.  Psychiatric: She has a normal mood and affect. Her behavior is normal. Judgment and thought  content normal.  Vitals reviewed.   Diagnostics:  FVC 1.65 L FEV1 1.35 L. Predicted forced vital capacity 2.44 L predicted FEV1 1.97 L-this shows a mild reduction in the forced vital capacity which is stable for  Assessment and Plan: 1. Mild persistent asthma, uncomplicated   2. Allergic rhinitis due to pollen   3. Allergy with anaphylaxis due to food, subsequent encounter   4. Current use of beta blocker   5. Gastroesophageal reflux disease without esophagitis   6. History of tachycardia     Meds ordered this encounter  Medications  . DISCONTD: montelukast (SINGULAIR) 10 MG tablet    Sig: Take 1 tablet (10 mg total) by mouth daily.    Dispense:  90 tablet    Refill:  0  . fluticasone (FLONASE) 50 MCG/ACT nasal spray    Sig: Place 2 sprays into both nostrils daily.    Dispense:  48 g    Refill:  0    90 Day Supply  . montelukast (SINGULAIR) 10 MG tablet    Sig: Take 1 tablet (10 mg total) by mouth daily.    Dispense:  90 tablet    Refill:  0    There are no Patient Instructions on file for this visit.  Return in about 7 days (around 02/17/2016).    Thank you for the opportunity to care for this patient.  Please do not hesitate to contact me with questions.  Penne Lash, M.D.  [facility] [address] [place], [place] [address] [phone]

## 2016-02-14 ENCOUNTER — Other Ambulatory Visit: Payer: Self-pay | Admitting: *Deleted

## 2016-02-14 MED ORDER — FLUTICASONE PROPIONATE 50 MCG/ACT NA SUSP: 2.0000 | Freq: Every day | NASAL | Status: DC

## 2016-02-14 MED ORDER — MONTELUKAST SODIUM 10 MG PO TABS: 10.0000 mg | ORAL_TABLET | Freq: Every day | ORAL | Status: DC

## 2016-02-17 ENCOUNTER — Ambulatory Visit: Payer: Medicare Other | Admitting: Pediatrics

## 2016-02-20 ENCOUNTER — Other Ambulatory Visit: Payer: Self-pay | Admitting: Internal Medicine

## 2016-02-20 ENCOUNTER — Ambulatory Visit (INDEPENDENT_AMBULATORY_CARE_PROVIDER_SITE_OTHER): Payer: Medicare Other | Admitting: Internal Medicine

## 2016-02-20 ENCOUNTER — Encounter: Payer: Self-pay | Admitting: Internal Medicine

## 2016-02-20 VITALS — BP 126/76 | HR 63 | Ht 59.0 in | Wt 151.4 lb

## 2016-02-20 DIAGNOSIS — J387 Other diseases of larynx: Secondary | ICD-10-CM

## 2016-02-20 DIAGNOSIS — J849 Interstitial pulmonary disease, unspecified: Secondary | ICD-10-CM | POA: Diagnosis not present

## 2016-02-20 DIAGNOSIS — R05 Cough: Secondary | ICD-10-CM | POA: Diagnosis not present

## 2016-02-20 DIAGNOSIS — R053 Chronic cough: Secondary | ICD-10-CM

## 2016-02-20 DIAGNOSIS — J679 Hypersensitivity pneumonitis due to unspecified organic dust: Secondary | ICD-10-CM | POA: Diagnosis not present

## 2016-02-20 MED ORDER — FLUTICASONE FUROATE-VILANTEROL 100-25 MCG/INH IN AEPB: 1.0000 | INHALATION_SPRAY | Freq: Every day | RESPIRATORY_TRACT | Status: DC

## 2016-02-20 NOTE — Progress Notes (Signed)
Subjective:     Patient ID: Rhonda [name], female   DOB: 09/19/1951, 65 y.o.   MRN: [id number]  HPI   #GE reflux with small hiatal hernia  - on ppi  #MEtabolic Syndrome  - On low glycemic diet  159#  on 09/19/12 147# on 11/21/12 145# on 01/02/13 145# on 02/28/2013 147# - on 05/25/2013 154# - 09/18/14   #History of rapid heart rate not otherwise specified  - Start her on Lopressor  2008/2009 by primary care physician. History appears to correlate with onset of respiratory issues  - refuses to dc this drug as of 2014 discussion   # Hypersensitivity Pneumonitis and ILD  - Potential etiologies - cockateil x 2 for 18 years through end 2012. 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 Denies mold but lives in house built in [place] with a "weird baselment: and has humidifier  - first noted on CT chest 10/15/09 following trip to [place] (PE negative) but not described in 2003 CT chest report  - autoimmune panel: 10/23/11: Negative  -  Uderwennt bronch 11/19/11  - non diagnostic  - VATS Nov 2013 (done after initially refusing)- [place]. However, there is worrying trend of UIP pattern in the Upper lobes.            Oct/Nov 2012  March 2013 08/11/12  Nov/Dec 2013 Dx HP/UIP 01/02/2013  02/28/13 05/25/2013  12/06/2013  03/15/2014  06/29/2014  10'27/15 12/19/2014  02/26/15 10/11/2015  01/13/2016   Symp/Signs Dyspnea x5y  New crackles  dimished dyspnea except at hill. Improved crackles.  Improved dyspnea except @ hill           Fev1  1.9 L   1.7L/90%  1.66 L/87%   1.66 L/78%  1.75L/90% 1.68L/82% 1.56/75 1.6L/78% 1.48/75% 1.58L/77%  1.52L/78%  1.39/72% (jan)  FVC  2.6 L    2.2 L/82%   2.1 L/80%   2.19 L/82%  2.2L/91% 2.1/81% 1.86/69% 1.9L/71% 1.86/75% 1.75L/65% 1.82/73% (dec 2015 offce spiro) 2.84L/72%  1.54/61%  Ratio     79   76/95%  80/99% 80/100%  84  90      fef 25-75%                  TLC  4.3 L    3.3 L/79%  3.5 L/82%     3.22/72% 3.06L/68%  3.06/68%  3.78/87%  2.86/66%  DLCO  10/50%    10/51%   9.6/51%     11.9/63% 11.7/62%  11/561%  5.5/31%  10.06/57%  Walk test 185 ft x 3 laps on RA  rest 92%. Pk exertion 90%. Pk HR 108 Rest 92%. Pk exertion 86% at 2.5 laps. Pk HR 102  pk exertion - 88% Rest 96%. Pk 95%. Pk HR is 75. Done at full dose lopressor Rest 94%. Pk 91%. Rest HR 93 with pk HR 103/min. Done at half dose lopressor REst 96%. Pk pulse ox 92%. Rest HR 65. Pk HR 86 Did not desat. Normal Pk HR  Pulse ox 96%, droped to89% at 2nd lap but bounced up and ended at 91% 3rd lap Did not desat Did not desat, Pk HHR 93  Pulse ox low 92%, pk HR 107 ;llowest pulse ox 90% at 2nd lap but she held her breath, then 92% at 3rd lap  CT chest   unchanged            Ct mid  dec possibly worse - unsure  Tests/Bx Bronch 0- non diagnostic   VATS - HP with UIP in Upper lobe             RX    Rx pred start 11/23/12 On  pred 59m per day and will reduce to 2108mOn Pred 2082mer day On pred 59m97mr day On pred 5mg 63m day Rx pred 5mg d41my She reduced to pred 5mg x 159mt 1 month QOD due to GI concern side effects On pred 5mg qod61mt will increase to 5/d after this visit On pred 5mg dail52mOn pred 5mg daily30m                   OV 12/19/2014  Chief Complaint  Patient presents with  . Follow-up    Pt stated she is much improved since being sick. Pt c/o DOE d/t cold weather, prod cough with white mucus, PND and epigastric pain.     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 Now cough has improved to near baseline.  Stil bothered by the residual cough which seems to have significant neurogenic component (cleras throat, gag etc., ). Dyspnea on exertion is class 2 and stable compated to last visit with stable spirometry and walking desat test. However, husband feels over time pst few years dyspnea is  progressive. She is very afraid of even the mildes incline. Swimming poses problem for dyspnea and cough. Never been to rehab due to daily swimming. Now worried that dyspnea/cough can impact desire to travel abroad. Also, seems to be having non specific msk tupe right chest wall pain for many months.   Past, Family, Social reviewed: no change since last visit   OV 02/26/2015  Chief Complaint  Patient presents with  . Follow-up    Pt here after PFT and HRCT. Pt stated her breathing is better but her allergies are bothersome. Pt c/o prod cough with white mucus, sinus congestion, rhinorrhea. Pt stated her cough is better when she lays down.     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 AT that time big issue was severe cough which was felt largely cough hyperesnitvity due to fall 2015 viral infection. She says that slowly resolved. Wit that dyspnea improved too. [place] ever before. However, past week pollen level high and having cough again though not as bad. She is takng nasal steroid, OTC antihistamine x 2 and his helping. Dyspnea itself is stble. Cntinues pred 5mg per da39mAtt[facility]d [facility] [place]. 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 The chronic cough is resolved[place] Dyhspnea is mild , on exertion , relieved by rest and stable withoiut change. Continues prednisone 5mg per day29mhe is is due for Hip surgery with Dr Olin in Oct Al[place] Dame. She has lot of questions that she wants me to talk to DR Olin about. Alvan Dameoke to him several days after this visit when he called and resolved questions. Overall low risk for pulmonary complications after hip surgery  Spiro FVC 1.8L and walking ddesat test - no desat in office  Suggests stability\   OV 10/11/2015  Chief  Complaint  Patient presents with  . Follow-up    Pt c/o increaes in SOB, desaturation with activity. Pt c/o recent cough with yellow mucus in morning and white mucus throughout the day. Pt c/o pain under right breast when twisting or bending over. Pt stated she had a right hip replacement.    follow-up interstitial lung disease due to hypersensitivity pneumonitis on baseline prednisone 5 mg per day  She had hip surgery 09/09/2015 and has had an uneventful postoperative course. She is ambulating slowly and her pain is resolved. 10 days ago started having worsening cough similar to 4 2015 when she had acute bronchitis which degenerated into chronic irritable larynx syndrome and chronic cough. Then some 3 days ago the sputum changed green in color associated with right-sided musculoskeletal chest pain at the site of surgical lung biopsy incision from the past. Of note she has a history of pneumothorax of the right side but a spontaneous but on the same side a surgical lung biopsy. No specific fever or hemoptysis or leg swelling. She's also noticed increased desaturations to 88% at home but here walking desaturation test she did get dyspneic 185 feet 3 laps on room air. She had more dyspnea than usual but she did not desaturate below 92% but did get tachycardic. She does feel better since yesterday  OV 01/13/2016  Chief Complaint  Patient presents with  . Follow-up    Pt c/o continued cough with clear/white mucus and SOB with exertion. Pt also c/o increasing pain under each breast that shoots through to her back.    Follow-up interstitial lung disease due to hypersensitivity pneumonitis on baseline prednisone 5 mg per day  Since last visit 3 months ago in November 2016 she continues to have severe chronic cough. This really has not gone away. It is completely consuming her quality of life. At last visit in mid November 2016 we did a day prednisone burst along with doxycycline but it did not help.  Following this did CT sinus that was normal early December 2016. Did high resolution CT chest and it suggested that the groundglass opacities with slightly prominent and she was possibly worse. Then in mid December 2016 as a result we did a 3 week prednisone burst. This again did not help the cough. Right at the end of this prednisone taper and gender 2017 did a pulmonary function test. The shows decline in her lung function but she does not feel decline in terms of dyspnea and she says that the PFT was hard because of her cough and she attributes the worsening due to cough and performance issues. Overall her dyspnea is unchanged and she is at baseline. When she climbs the stairs she desaturates to 85% at home but she says this is baseline.  Cough is the main thing that is consuming her. This cough is severe. Happens at the end of swimming when she does of breath hold. Also happens when she talks or laughs. Cough does not bother her when she sleeps and she is able to sleep through the night. Associated with significant sputum. This. It was mostly clear. There is a constant sensation of tickle in the throat or something in the throat. Her voice is hoarse periodically. Cough is also associated with significant right-sided musculoskeletal stabbing pain on the side of the biopsy. She has recently seen allergist  Dr Angelica Pou and she is allergic to many things including dust and mold. She says  there is no mold in the house anymore. She is now on Singulair and to antihistamines. But these have not helped. In fact she says that when she stopped taking her antihistamines for allergy testing the cough did not change at all. She questions the usefulness of these medications and helping her cough. She continues with acid reflux treatment unchanged. RSI cough score is 17 and reflects irritable larynx syndrome. Cough differentiator score performed and detailed below and suggests that she does indeed have irritable larynx  syndrome. Exhaled nitric oxide test today done in the office is normal and suggests he does not not have eosinophilic airway inflammation   OV 02/20/2016  Chief Complaint  Patient presents with  . Follow-up    Pt states her cough has improved since last OV. Pt states her cough starts to worsen towards 5pm each day. Pt states she was exposed to car exhaust and states her cough worsened for 1 week but now feels better.    Follow-up chronic cough in the setting of interstitial lung disease/hypersensitivity pneumonitis. On daily prednisone 5 mg  Last visit was approximately 5 weeks ago. At that time she had worsening cough. He just was strongly consistent with cough neuropathy/irritable larynx syndrome. I started on gabapentin at the last visit. In the interim I keep in touch with her. She reported that as of a few weeks ago cough is significantly improved just a mild level. Therefore she decided to skip on speech therapy. She continues with prednisone 5 mg per day. Then approximately 10 days ago she got exposed to diesel fume and then a week ago she came off Singulair due to allergy testing needs with her allergist Dr Angelica Pou. This then resulted in decompensation of the cough and the cough is significantly worse. However the cough is better than 4 or 5 weeks ago. RSI cough score is 15 shows only it is marginally better right now than last visit despite gabapentin. In talking to her features are still fully consistent with irritable larynx syndrome. She does not have worsening dyspnea when she swims or she walks  She is frustrated by symptoms particularly because she is significant nasal drainage and bring up sputum. She does not believe the interstitial lung diseases any worse. She feels albuterol does help her cough     Dr Lorenza Cambridge Reflux Symptom Index (> 13-15 suggestive of LPR cough) 01/13/2016  02/20/2016 Post gabapentin - got worse past 1 week  Hoarseness of problem with voice 3 4  Clearing   Of Throat 3 3  Excess throat mucus or feeling of post nasal drip 5 4  Difficulty swallowing food, liquid or tablets 0 0  Cough after eating or lying down 0 0  Breathing difficulties or choking episodes 1 0  Troublesome or annoying cough 4 3  Sensation of something sticking in throat or lump in throat 1 1  Heartburn, chest pain, indigestion, or stomach acid coming up 0 0  TOTAL 17 15      Kouffman Reflux v Neurogenic Cough Differentiator Reflux  Do you awaken from a sound sleep coughing violently?                            With trouble breathing? no  Do you have choking episodes when you cannot  Get enough air, gasping for air ?              no  Do you usually cough when you lie  down into  The bed, or when you just lie down to rest ?                          no  Do you usually cough after meals or eating?         no  Do you cough when (or after) you bend over?    no  GERD SCORE  0  Kouffman Reflux v Neurogenic Cough Differentiator Neurogenic  Do you more-or-less cough all day long? yes  Does change of temperature make you cough? no  Does laughing or chuckling cause you to cough? yes  Do fumes (perfume, automobile fumes, burned  Toast, etc.,) cause you to cough ?      yes  Does speaking, singing, or talking on the phone cause you to cough   ?               yes  Neurogenic/Airway score 4    Current outpatient prescriptions:  .  acetaminophen (TYLENOL) 325 MG tablet, Take 650 mg by mouth every 6 (six) hours as needed. Reported on 02/10/2016, Disp: , Rfl:  .  albuterol (PROAIR HFA) 108 (90 Base) MCG/ACT inhaler, Inhale 2 puffs into the lungs every 4 (four) hours as needed for wheezing. Or coughing spells.  You may use 2 Puffs 5-10 minutes before exercise., Disp: 1 Inhaler, Rfl: 3 .  alendronate (FOSAMAX) 70 MG tablet, TAKE 1 TABLET BY MOUTH EVERY 7 DAYS. TAKE W/ A FULL GLASS OF WATER ONANEMPTY STOMACH, Disp: 12 tablet, Rfl: 0 .  Alpha-D-Galactosidase (BEANO PO), Take 1 tablet by mouth  as needed (if eating onion or garlic). , Disp: , Rfl:  .  Alum Hydroxide-Mag Carbonate (GAVISCON PO), Take 1 tablet by mouth as needed (bad GI distress). , Disp: , Rfl:  .  Ascorbic Acid (VITAMIN C) 1000 MG tablet, Take 1,000 mg by mouth daily., Disp: , Rfl:  .  chlorpheniramine (CHLOR-TRIMETON) 4 MG tablet, Take 4 mg by mouth daily as needed for allergies. , Disp: , Rfl:  .  dicyclomine (BENTYL) 10 MG capsule, Take 10 mg by mouth 3 (three) times daily as needed for spasms (stomach cramping). Reported on 01/06/2016, Disp: , Rfl:  .  EPINEPHrine (EPIPEN 2-PAK) 0.3 mg/0.3 mL IJ SOAJ injection, Inject 0.3 mLs (0.3 mg total) into the muscle once., Disp: 2 Device, Rfl: 2 .  fluticasone (FLONASE) 50 MCG/ACT nasal spray, Place 2 sprays into both nostrils daily., Disp: 48 g, Rfl: 0 .  gabapentin (NEURONTIN) 300 MG capsule, Take 1 capsule (300 mg total) by mouth 3 (three) times daily. TAKE AS DIRECTED, Disp: 90 capsule, Rfl: 0 .  lactase (LACTAID) 3000 UNITS tablet, Take 3,000 Units by mouth as needed (when eating ice cream or dariy). , Disp: , Rfl:  .  metoprolol succinate (TOPROL-XL) 25 MG 24 hr tablet, Take 25 mg by mouth daily., Disp: , Rfl:  .  montelukast (SINGULAIR) 10 MG tablet, Take 1 tablet (10 mg total) by mouth daily., Disp: 90 tablet, Rfl: 0 .  Multiple Vitamin (MULTIVITAMIN) tablet, Take 1 tablet by mouth daily., Disp: , Rfl:  .  omeprazole (PRILOSEC) 20 MG capsule, Take 1 capsule (20 mg total) by mouth daily., Disp: 30 capsule, Rfl: 5 .  pravastatin (PRAVACHOL) 40 MG tablet, Take 0.5 tablets (20 mg total) by mouth daily., Disp: 135 tablet, Rfl: 0 .  predniSONE (DELTASONE) 5 MG tablet, Take 1 tablet (5 mg total) by mouth daily  with breakfast., Disp: 90 tablet, Rfl: 2 .  Probiotic Product (PROBIOTIC ADVANCED PO), Take 1 capsule by mouth daily., Disp: , Rfl:  .  traMADol (ULTRAM) 50 MG tablet, Take 1-2 tablets (50-100 mg total) by mouth every 6 (six) hours as needed for moderate pain or severe  pain., Disp: 60 tablet, Rfl: 0   Review of Systems     Objective:   Physical Exam  Constitutional: She is oriented to person, place, and time. She appears well-developed and well-nourished. No distress.  HENT:  Head: Normocephalic and atraumatic.  Right Ear: External ear normal.  Left Ear: External ear normal.  Mouth/Throat: Oropharynx is clear and moist. No oropharyngeal exudate.  Eyes: Conjunctivae and EOM are normal. Pupils are equal, round, and reactive to light. Right eye exhibits no discharge. Left eye exhibits no discharge. No scleral icterus.  Neck: Normal range of motion. Neck supple. No JVD present. No tracheal deviation present. No thyromegaly present.  Cardiovascular: Normal rate, regular rhythm, normal heart sounds and intact distal pulses.  Exam reveals no gallop and no friction rub.   No murmur heard. Pulmonary/Chest: Effort normal. No respiratory distress. She has no wheezes. She has rales. She exhibits no tenderness.  Scattered occasional faint crackles which are baseline  Abdominal: Soft. Bowel sounds are normal. She exhibits no distension and no mass. There is no tenderness. There is no rebound and no guarding.  Musculoskeletal: Normal range of motion. She exhibits no edema or tenderness.  Lymphadenopathy:    She has no cervical adenopathy.  Neurological: She is alert and oriented to person, place, and time. She has normal reflexes. No cranial nerve deficit. She exhibits normal muscle tone. Coordination normal.  Skin: Skin is warm and dry. No rash noted. She is not diaphoretic. No erythema. No pallor.  Psychiatric: She has a normal mood and affect. Her behavior is normal. Judgment and thought content normal.  Vitals reviewed.  \ Filed Vitals:   02/20/16 1427  BP: 126/76  Pulse: 63  Height: 4\' 11"  (1.499 m)  Weight: 151 lb 6.4 oz (68.675 kg)  SpO2: 97%        Assessment:       ICD-9-CM ICD-10-CM   1. Chronic cough 786.2 R05   2. Irritable larynx syndrome  478.79 J38.7   3. ILD (interstitial lung disease) ([place]) 515 J84.9   4. Hypersensitivity pneumonitis ([facility]) 495.9 J67.9        Plan:     - I still continue to believe there is a strong element of cough neuropathy or irritable larynx syndrome - This is brought on by multi-factorial reasons from allergies to sinus drainage to interstitial lung disease - Interstitial lung disease itself I believe is possibly stable but need to make sure is not progressing - You had a recent flare up because of ease of smoke exposure and coming off Singulair  PLAN - Continue gabapentin daily at night as before - Definitely refer to speech-wise rehabilitation Mr. Montez Morita - Try Brio inhaler 1 puff daily and pretreatment  - based on discussion hold off prednisone other than daily 5mg  prednisone for HP/ILD -  follow all the advice of allergy Dr. Marland KitchenRepeat high-resolution CT chest and full pulmonary function test in June 2017 - 36month followup to ensure no worsening ild  Follow-up - June 2017 after high-resolution CT chest and full pulmonary function test - need to ensure ILD i   > 50% of this > 25 min visit spent in face to face counseling or  coordination of care       Dr. Brand Males, M.D., [facility].C.P Pulmonary and Critical Care Medicine Staff Physician [place] [facility] Pager: [phone], If no answer or between  15:00h - 7:00h: call [phone]  02/20/2016 3:08 PM

## 2016-02-20 NOTE — Patient Instructions (Addendum)
ICD-9-CM ICD-10-CM   1. Chronic cough 786.2 R05   2. Irritable larynx syndrome 478.79 J38.7   3. ILD (interstitial lung disease) ([place]) 515 J84.9   4. Hypersensitivity pneumonitis ([facility]) 495.9 J67.9    - I still continue to believe there is a strong element of cough neuropathy or irritable larynx syndrome - This is brought on by multi-factorial reasons from allergies to sinus drainage to interstitial lung disease - Interstitial lung disease itself I believe is possibly stable but need to make sure is not progressing - You had a recent flare up because of ease of smoke exposure and coming off Singulair  PLAN - Continue gabapentin daily at night as before - Definitely refer to speech-wise rehabilitation Mr. Montez Morita - Try Brio inhaler 1 puff daily and pretreatment  - based on discussion hold off prednisone orther than daily 5mg  prednisone for HP/ILD -  follow all the advice of allergy Dr. Marland KitchenRepeat high-resolution CT chest and full pulmonary function test in June 2017  Follow-up - June 2017 after high-resolution CT chest and full pulmonary function test

## 2016-02-21 ENCOUNTER — Telehealth: Payer: Self-pay | Admitting: Internal Medicine

## 2016-02-21 NOTE — Telephone Encounter (Signed)
lmomtcb x1 

## 2016-02-21 NOTE — Telephone Encounter (Signed)
According to epic, this was already refilled to the pharmacy today to [facility] for pt

## 2016-02-21 NOTE — Telephone Encounter (Signed)
Patient called back, triage line busy, advised patient of the note that med refilled at [facility] today.

## 2016-02-25 DIAGNOSIS — Z0000 Encounter for general adult medical examination without abnormal findings: Secondary | ICD-10-CM | POA: Diagnosis not present

## 2016-02-25 DIAGNOSIS — R5383 Other fatigue: Secondary | ICD-10-CM | POA: Diagnosis not present

## 2016-02-25 DIAGNOSIS — J849 Interstitial pulmonary disease, unspecified: Secondary | ICD-10-CM | POA: Diagnosis not present

## 2016-02-25 DIAGNOSIS — R739 Hyperglycemia, unspecified: Secondary | ICD-10-CM | POA: Diagnosis not present

## 2016-02-25 DIAGNOSIS — E785 Hyperlipidemia, unspecified: Secondary | ICD-10-CM | POA: Diagnosis not present

## 2016-02-25 DIAGNOSIS — R7301 Impaired fasting glucose: Secondary | ICD-10-CM | POA: Diagnosis not present

## 2016-02-25 DIAGNOSIS — Z049 Encounter for examination and observation for unspecified reason: Secondary | ICD-10-CM | POA: Diagnosis not present

## 2016-02-26 DIAGNOSIS — M1612 Unilateral primary osteoarthritis, left hip: Secondary | ICD-10-CM | POA: Diagnosis not present

## 2016-03-02 DIAGNOSIS — H2511 Age-related nuclear cataract, right eye: Secondary | ICD-10-CM | POA: Diagnosis not present

## 2016-03-02 DIAGNOSIS — H25011 Cortical age-related cataract, right eye: Secondary | ICD-10-CM | POA: Diagnosis not present

## 2016-03-02 DIAGNOSIS — H35342 Macular cyst, hole, or pseudohole, left eye: Secondary | ICD-10-CM | POA: Diagnosis not present

## 2016-03-02 DIAGNOSIS — Z0100 Encounter for examination of eyes and vision without abnormal findings: Secondary | ICD-10-CM | POA: Diagnosis not present

## 2016-03-16 ENCOUNTER — Ambulatory Visit: Payer: Medicare Other

## 2016-04-13 ENCOUNTER — Other Ambulatory Visit: Payer: Self-pay | Admitting: Family Medicine

## 2016-04-24 ENCOUNTER — Other Ambulatory Visit: Payer: Self-pay

## 2016-04-24 ENCOUNTER — Telehealth: Payer: Self-pay

## 2016-04-24 MED ORDER — PRAVASTATIN SODIUM 40 MG PO TABS: 20.0000 mg | ORAL_TABLET | Freq: Every day | ORAL | Status: DC

## 2016-04-24 NOTE — Telephone Encounter (Signed)
Pharmacy called stating prescription for pravastatin did not come through for pt. Please advise.

## 2016-04-25 MED ORDER — PRAVASTATIN SODIUM 40 MG PO TABS: 20.0000 mg | ORAL_TABLET | Freq: Every day | ORAL | Status: DC

## 2016-04-25 NOTE — Telephone Encounter (Signed)
Resent today.

## 2016-04-27 ENCOUNTER — Ambulatory Visit (INDEPENDENT_AMBULATORY_CARE_PROVIDER_SITE_OTHER)
Admission: RE | Admit: 2016-04-27 | Discharge: 2016-04-27 | Disposition: A | Discharge status: Home / Self Care | Payer: Medicare Other | Source: Ambulatory Visit | Attending: Internal Medicine | Admitting: Internal Medicine

## 2016-04-27 DIAGNOSIS — J849 Interstitial pulmonary disease, unspecified: Secondary | ICD-10-CM

## 2016-04-27 DIAGNOSIS — J8410 Pulmonary fibrosis, unspecified: Secondary | ICD-10-CM | POA: Diagnosis not present

## 2016-04-29 ENCOUNTER — Ambulatory Visit (INDEPENDENT_AMBULATORY_CARE_PROVIDER_SITE_OTHER): Payer: Medicare Other | Admitting: Internal Medicine

## 2016-04-29 ENCOUNTER — Encounter: Payer: Self-pay | Admitting: Internal Medicine

## 2016-04-29 VITALS — BP 122/68 | HR 66 | Ht 60.0 in | Wt 147.0 lb

## 2016-04-29 DIAGNOSIS — J679 Hypersensitivity pneumonitis due to unspecified organic dust: Secondary | ICD-10-CM | POA: Diagnosis not present

## 2016-04-29 DIAGNOSIS — J849 Interstitial pulmonary disease, unspecified: Secondary | ICD-10-CM | POA: Diagnosis not present

## 2016-04-29 LAB — PULMONARY FUNCTION TEST
DL/VA % pred: 104 %
DL/VA: 4.43 ml/min/mmHg/L
DLCO COR % PRED: 65 %
DLCO cor: 12.4 ml/min/mmHg
DLCO unc % pred: 64 %
DLCO unc: 12.12 ml/min/mmHg
FEF 25-75 POST: 2.44 L/s
FEF 25-75 Pre: 2.4 L/sec
FEF2575-%Change-Post: 1 %
FEF2575-%PRED-POST: 135 %
FEF2575-%Pred-Pre: 132 %
FEV1-%CHANGE-POST: 3 %
FEV1-%Pred-Post: 80 %
FEV1-%Pred-Pre: 77 %
FEV1-PRE: 1.55 L
FEV1-Post: 1.61 L
FEV1FVC-%CHANGE-POST: 2 %
FEV1FVC-%PRED-PRE: 113 %
FEV6-%Change-Post: 0 %
FEV6-%Pred-Post: 71 %
FEV6-%Pred-Pre: 70 %
FEV6-Post: 1.78 L
FEV6-Pre: 1.77 L
FEV6FVC-%Pred-Post: 105 %
FEV6FVC-%Pred-Pre: 105 %
FVC-%Change-Post: 0 %
FVC-%PRED-PRE: 67 %
FVC-%Pred-Post: 67 %
FVC-Post: 1.78 L
POST FEV1/FVC RATIO: 90 %
PRE FEV1/FVC RATIO: 88 %
PRE FEV6/FVC RATIO: 100 %
Post FEV6/FVC ratio: 100 %
RV % PRED: 67 %
RV: 1.3 L
TLC % pred: 66 %
TLC: 2.98 L

## 2016-04-29 NOTE — Patient Instructions (Addendum)
ICD-9-CM ICD-10-CM   1. ILD (interstitial lung disease) ([place]) 515 J84.9   2. Hypersensitivity pneumonitis ([place]) 495.9 J67.9     Glad you are better Diseas is progressive since 2013/2014 but  similar to early 2015 and improved from early 2017 Continue prednisone 5mg  per day FLu shot in fall   Followup 4-5 months - early fall 2017 or sooner if needed  - walk test at followup

## 2016-04-29 NOTE — Progress Notes (Signed)
PFT done today. 

## 2016-04-29 NOTE — Progress Notes (Signed)
Subjective:     Patient ID: Rhonda [name], female   DOB: 07-Mar-1951, 65 y.o.   MRN: [id number]  HPI    #GE reflux with small hiatal hernia  - on ppi  #MEtabolic Syndrome  - On low glycemic diet  159#  on 09/19/12 147# on 11/21/12 145# on 01/02/13 145# on 02/28/2013 147# - on 05/25/2013 154# - 09/18/14 147# - June 2017   #History of rapid heart rate not otherwise specified  - Start her on Lopressor  2008/2009 by primary care physician. History appears to correlate with onset of respiratory issues  - refuses to dc this drug as of 2014 discussion   # Hypersensitivity Pneumonitis and ILD  - Potential etiologies - cockateil x 2 for 18 years through end 2012. 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 Denies mold but lives in house [place] in [place] with a "weird baselment: and has humidifier  - first noted on CT chest 10/15/09 following trip to [place] (PE negative) but not described in 2003 CT chest report  - autoimmune panel: 10/23/11: Negative  -  Uderwennt bronch 11/19/11  - non diagnostic  - VATS Nov 2013 (done after initially refusing)- [place]. However, there is worrying trend of UIP pattern in the Upper lobes.            Oct/Nov 2012  March 2013 08/11/12  Nov/Dec 2013 Dx HP/UIP 01/02/2013  02/28/13 05/25/2013  12/06/2013  03/15/2014  06/29/2014  10'27/15 12/19/2014  02/26/15 10/11/2015  01/13/2016  04/29/2016   Symp/Signs Dyspnea x5y  New crackles  dimished dyspnea except at hill. Improved crackles.  Improved dyspnea except @ hill            Fev1  1.9 L   1.7L/90%  1.66 L/87%   1.66 L/78%  1.75L/90% 1.68L/82% 1.56/75 1.6L/78% 1.48/75% 1.58L/77%  1.52L/78%  1.39/72% (jan) 1.61L/80%  FVC  2.6 L    2.2 L/82%   2.1 L/80%   2.19 L/82%  2.2L/91% 2.1/81% 1.86/69% 1.9L/71% 1.86/75% 1.75L/65% 1.82/73% (dec 2015 offce spiro) 2.84L/72%  1.54/61% 1.78L/67%  Ratio     79   76/95%   80/99% 80/100%  84  90       fef 25-75%                  TLC  4.3 L    3.3 L/79%  3.5 L/82%     3.22/72% 3.06L/68%  3.06/68%  3.78/87%  2.86/66% 2/98L/66%  DLCO  10/50%    10/51%   9.6/51%     11.9/63% 11.7/62%  11.561%  5.5/31%  10.06/57% 11/04/63%  Walk test 185 ft x 3 laps on RA  rest 92%. Pk exertion 90%. Pk HR 108 Rest 92%. Pk exertion 86% at 2.5 laps. Pk HR 102  pk exertion - 88% Rest 96%. Pk 95%. Pk HR is 75. Done at full dose lopressor Rest 94%. Pk 91%. Rest HR 93 with pk HR 103/min. Done at half dose lopressor REst 96%. Pk pulse ox 92%. Rest HR 65. Pk HR 86 Did not desat. Normal Pk HR  Pulse ox 96%, droped to89% at 2nd lap but bounced up and ended at 91% 3rd lap Did not desat Did not desat, Pk HHR 93  Pulse ox low 92%, pk HR 107 ;llowest pulse ox 90% at 2nd lap but she held her breath, then 92% at 3rd lap 99% -> 91% at end  of 3rd lap  CT chest   unchanged          yes  Ct mid dec possibly worse - unsure Ct - similar to April 2016 but worse since 2013  Tests/Bx Bronch 0- non diagnostic   VATS - HP with UIP in Upper lobe              RX    Rx pred start 11/23/12 On  pred 63m per day and will reduce to 275mOn Pred 2060mer day On pred 38m21mr day On pred 5mg 19m day Rx pred 5mg d24my She reduced to pred 5mg x 37mt 1 month QOD due to GI concern side effects On pred 5mg qod62mt will increase to 5/d after this visit On pred 5mg dail60mOn pred 5mg daily30mn pred 5mg daily 38m                  OV 12/19/2014  Chief Complaint  Patient presents with  . Follow-up    Pt stated she is much improved since being sick. Pt c/o DOE d/t cold weather, prod cough with white mucus, PND and epigastric pain.     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 Now cough has improved to near baseline.  Stil bothered by the residual cough which seems to have significant neurogenic component (cleras throat, gag  etc., ). Dyspnea on exertion is class 2 and stable compated to last visit with stable spirometry and walking desat test. However, husband feels over time pst few years dyspnea is progressive. She is very afraid of even the mildes incline. Swimming poses problem for dyspnea and cough. Never been to rehab due to daily swimming. Now worried that dyspnea/cough can impact desire to travel abroad. Also, seems to be having non specific msk tupe right chest wall pain for many months.   Past, Family, Social reviewed: no change since last visit   OV 02/26/2015  Chief Complaint  Patient presents with  . Follow-up    Pt here after PFT and HRCT. Pt stated her breathing is better but her allergies are bothersome. Pt c/o prod cough with white mucus, sinus congestion, rhinorrhea. Pt stated her cough is better when she lays down.     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 AT that time big issue was severe cough which was felt largely cough hyperesnitvity due to fall 2015 viral infection. She says that slowly resolved. Wit that dyspnea improved too. [place] better than ever before. However, past week pollen level high and having cough again though not as bad. She is takng nasal steroid, OTC antihistamine x 2 and his helping. Dyspnea itself is stble. Cntinues pred 5mg per day55mttendedd [facility] [place] [facility] [place]. 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 The chronic cough is resolved. [place]. Dyhspnea is mild , on exertion , relieved by rest and stable withoiut change. Continues prednisone 5mg per day.57me is is due for Hip surgery with Dr Olin in Oct 2Alvan Dame She has lot of questions that she wants me to talk to DR Olin about.Alvan Dame  I spoke to him several days after this visit when he called and resolved questions.  Overall low risk for pulmonary complications after hip surgery  Spiro FVC 1.8L and walking ddesat test - no desat in office  Suggests stability\   OV 10/11/2015  Chief Complaint  Patient presents with  . Follow-up    Pt c/o increaes in SOB, desaturation with activity. Pt c/o recent cough with yellow mucus in morning and white mucus throughout the day. Pt c/o pain under right breast when twisting or bending over. Pt stated she had a right hip replacement.    follow-up interstitial lung disease due to hypersensitivity pneumonitis on baseline prednisone 5 mg per day  She had hip surgery 09/09/2015 and has had an uneventful postoperative course. She is ambulating slowly and her pain is resolved. 10 days ago started having worsening cough similar to 4 2015 when she had acute bronchitis which degenerated into chronic irritable larynx syndrome and chronic cough. Then some 3 days ago the sputum changed green in color associated with right-sided musculoskeletal chest pain at the site of surgical lung biopsy incision from the past. Of note she has a history of pneumothorax of the right side but a spontaneous but on the same side a surgical lung biopsy. No specific fever or hemoptysis or leg swelling. She's also noticed increased desaturations to 88% at home but here walking desaturation test she did get dyspneic 185 feet 3 laps on room air. She had more dyspnea than usual but she did not desaturate below 92% but did get tachycardic. She does feel better since yesterday  OV 01/13/2016  Chief Complaint  Patient presents with  . Follow-up    Pt c/o continued cough with clear/white mucus and SOB with exertion. Pt also c/o increasing pain under each breast that shoots through to her back.    Follow-up interstitial lung disease due to hypersensitivity pneumonitis on baseline prednisone 5 mg per day  Since last visit 3 months ago in November 2016 she continues to have severe chronic cough. This really has not  gone away. It is completely consuming her quality of life. At last visit in mid November 2016 we did a day prednisone burst along with doxycycline but it did not help. Following this did CT sinus that was normal early December 2016. Did high resolution CT chest and it suggested that the groundglass opacities with slightly prominent and she was possibly worse. Then in mid December 2016 as a result we did a 3 week prednisone burst. This again did not help the cough. Right at the end of this prednisone taper and gender 2017 did a pulmonary function test. The shows decline in her lung function but she does not feel decline in terms of dyspnea and she says that the PFT was hard because of her cough and she attributes the worsening due to cough and performance issues. Overall her dyspnea is unchanged and she is at baseline. When she climbs the stairs she desaturates to 85% at home but she says this is baseline.  Cough is the main thing that is consuming her. This cough is severe. Happens at the end of swimming when she does of breath hold. Also happens when she talks or laughs. Cough does not bother her when she sleeps and she is able to sleep through the night. Associated with significant sputum. This. It was mostly clear. There is a constant sensation of tickle in the throat or something in the throat. Her voice is hoarse  periodically. Cough is also associated with significant right-sided musculoskeletal stabbing pain on the side of the biopsy. She has recently seen allergist  Dr Angelica Pou and she is allergic to many things including dust and mold. She says there is no mold in the house anymore. She is now on Singulair and to antihistamines. But these have not helped. In fact she says that when she stopped taking her antihistamines for allergy testing the cough did not change at all. She questions the usefulness of these medications and helping her cough. She continues with acid reflux treatment unchanged. RSI cough  score is 17 and reflects irritable larynx syndrome. Cough differentiator score performed and detailed below and suggests that she does indeed have irritable larynx syndrome. Exhaled nitric oxide test today done in the office is normal and suggests he does not not have eosinophilic airway inflammation   OV 02/20/2016  Chief Complaint  Patient presents with  . Follow-up    Pt states her cough has improved since last OV. Pt states her cough starts to worsen towards 5pm each day. Pt states she was exposed to car exhaust and states her cough worsened for 1 week but now feels better.    Follow-up chronic cough in the setting of interstitial lung disease/hypersensitivity pneumonitis. On daily prednisone 5 mg  Last visit was approximately 5 weeks ago. At that time she had worsening cough. He just was strongly consistent with cough neuropathy/irritable larynx syndrome. I started on gabapentin at the last visit. In the interim I keep in touch with her. She reported that as of a few weeks ago cough is significantly improved just a mild level. Therefore she decided to skip on speech therapy. She continues with prednisone 5 mg per day. Then approximately 10 days ago she got exposed to diesel fume and then a week ago she came off Singulair due to allergy testing needs with her allergist Dr Angelica Pou. This then resulted in decompensation of the cough and the cough is significantly worse. However the cough is better than 4 or 5 weeks ago. RSI cough score is 15 shows only it is marginally better right now than last visit despite gabapentin. In talking to her features are still fully consistent with irritable larynx syndrome. She does not have worsening dyspnea when she swims or she walks  She is frustrated by symptoms particularly because she is significant nasal drainage and bring up sputum. She does not believe the interstitial lung diseases any worse. She feels albuterol does help her cough    OV 04/29/2016  Chief  Complaint  Patient presents with  . Follow-up    Review CT and PFT.  Pt doing well, cough has improved since last ov.     FU ILD due to HP   Feels well. Says cough is not a problem. Says she is entereing best season for her. Last 2 winter cycle 2015-2016 and 2016-2017 bothered by horrible cough with irrtiable laynrhx component after acute sinus/bronchitis. PFT and CT on her daily 61m per day pred has improved and back to 2015 baseline levels. Disease state some worse compared to early 2014 when she was on higher pred dose but similar to 2015 when she has been on low dose pred. She prefers to stay on low dose pred.    has a past medical history of High cholesterol; History of chronic bronchitis; GERD (gastroesophageal reflux disease); Depression; Allergy; Asthma; Cataract; Pneumonitis, hypersensitivity (HFunk; Rapid heart rate; DOE (dyspnea on exertion); Hyperplastic colon polyp (2007); Hiatal hernia;  Interstitial lung disease ([place]); Insulin resistance; Inguinal hernia; Pulmonary fibrosis ([place]); Helicobacter pylori ab+; Heart palpitations (02/28/2015); Pulmonary HTN ([place]); MVP (mitral valve prolapse); Arthritis; and H/O steroid therapy.   reports that she quit smoking about 45 years ago. She started smoking about 46 years ago. She has never used smokeless tobacco.  Past Surgical History  Procedure Laterality Date  . Tubal ligation    . Foot fracture surgery  2006 or 2007    right  . Video bronchoscopy  11/19/2011    Procedure: VIDEO BRONCHOSCOPY WITH FLUORO;  Surgeon: Brand Males, MD;  Location: [facility] ENDOSCOPY;  Service: Endoscopy;;  . Hymenectomy    . Cesarean section    . Video assisted thoracoscopy  09/28/2012    Procedure: VIDEO ASSISTED THORACOSCOPY;  Surgeon: Melrose Nakayama, MD;  Location: [facility];  Service: Thoracic;  Laterality: Right;  . Lung biopsy  09/28/2012    Procedure: LUNG BIOPSY;  Surgeon: Melrose Nakayama, MD;  Location: [facility];  Service: Thoracic;  Laterality: N/A;   lung biopsies tims three  . Breast biopsy Right 2009    BIOPSY  . Cataract extraction Left   . Total hip arthroplasty Right 09/10/2015    Procedure: RIGHT TOTAL HIP ARTHROPLASTY ANTERIOR APPROACH;  Surgeon: Paralee Cancel, MD;  Location: [facility];  Service: Orthopedics;  Laterality: Right;    Allergies  Allergen Reactions  . Atorvastatin Other (See Comments)    Leg pain  . Betadine [Povidone Iodine] Other (See Comments)    blisters  . Codeine Nausea And Vomiting  . Rosuvastatin Other (See Comments)    Leg pain  . Shellfish Allergy Other (See Comments)    vomiting  . Sulfonamide Derivatives Other (See Comments)    headaches    Immunization History  Administered Date(s) Administered  . Hepatitis A 05/23/2010  . Hepatitis B 06/23/2006  . Influenza Split 08/11/2012  . Influenza Whole 09/08/2011  . Influenza,inj,Quad PF,36+ Mos 09/07/2013  . Influenza-Unspecified 09/26/2014, 07/27/2015  . MMR 05/23/2010  . Pneumococcal Conjugate-13 03/15/2014  . Pneumococcal Polysaccharide-23 03/15/2014  . Td 02/22/2003  . Tdap 10/28/2012  . Zoster 12/04/2010    Family History  Problem Relation Age of Onset  . Emphysema Maternal Grandmother   . Asthma Maternal Grandmother   . Asthma Mother   . Osteoarthritis Mother   . Dementia Mother 35  . Lymphoma Father   . Diabetes Father   . Hypertension Sister   . Heart disease Sister   . Kidney disease Sister   . Lung disease Maternal Grandfather   . Breast cancer Paternal Aunt   . Ovarian cancer Maternal Aunt   . Bone cancer Paternal Grandfather   . Colon cancer Neg Hx   . Angioedema Neg Hx   . Eczema Neg Hx   . Immunodeficiency Neg Hx   . Urticaria Neg Hx   . Allergic rhinitis Daughter      Current outpatient prescriptions:  .  acetaminophen (TYLENOL) 325 MG tablet, Take 650 mg by mouth every 6 (six) hours as needed. Reported on 02/10/2016, Disp: , Rfl:  .  albuterol (PROAIR HFA) 108 (90 Base) MCG/ACT inhaler, Inhale 2 puffs into the  lungs every 4 (four) hours as needed for wheezing. Or coughing spells.  You may use 2 Puffs 5-10 minutes before exercise., Disp: 1 Inhaler, Rfl: 3 .  Alpha-D-Galactosidase (BEANO PO), Take 1 tablet by mouth as needed (if eating onion or garlic). , Disp: , Rfl:  .  Alum Hydroxide-Mag Carbonate (GAVISCON PO), Take  1 tablet by mouth as needed (bad GI distress). , Disp: , Rfl:  .  Ascorbic Acid (VITAMIN C) 1000 MG tablet, Take 1,000 mg by mouth daily., Disp: , Rfl:  .  Calcium Carb-Cholecalciferol (CALCIUM 1000 + D PO), Take 1 tablet by mouth daily., Disp: , Rfl:  .  chlorpheniramine (CHLOR-TRIMETON) 4 MG tablet, Take 4 mg by mouth daily as needed for allergies. , Disp: , Rfl:  .  dicyclomine (BENTYL) 10 MG capsule, Take 10 mg by mouth 3 (three) times daily as needed for spasms (stomach cramping). Reported on 01/06/2016, Disp: , Rfl:  .  EPINEPHrine (EPIPEN 2-PAK) 0.3 mg/0.3 mL IJ SOAJ injection, Inject 0.3 mLs (0.3 mg total) into the muscle once., Disp: 2 Device, Rfl: 2 .  fluticasone (FLONASE) 50 MCG/ACT nasal spray, Place 2 sprays into both nostrils daily. (Patient taking differently: Place 2 sprays into both nostrils 2 (two) times daily. ), Disp: 48 g, Rfl: 0 .  fluticasone furoate-vilanterol (BREO ELLIPTA) 100-25 MCG/INH AEPB, Inhale 1 puff into the lungs daily., Disp: 60 each, Rfl: 5 .  gabapentin (NEURONTIN) 300 MG capsule, TAKE 1 CAPSULE (300 MG TOTAL) BY MOUTH 3 (THREE) TIMES DAILY. TAKE AS DIRECTED, Disp: 90 capsule, Rfl: 5 .  lactase (LACTAID) 3000 UNITS tablet, Take 3,000 Units by mouth as needed (when eating ice cream or dariy). , Disp: , Rfl:  .  metoprolol succinate (TOPROL-XL) 25 MG 24 hr tablet, Take 25 mg by mouth daily., Disp: , Rfl:  .  metoprolol succinate (TOPROL-XL) 50 MG 24 hr tablet, TAKE 1 TABLET BY MOUTH EVERY DAY IMMEDIATELY FOLLOWING A MEAL., Disp: 30 tablet, Rfl: 0 .  montelukast (SINGULAIR) 10 MG tablet, Take 1 tablet (10 mg total) by mouth daily., Disp: 90 tablet, Rfl: 0 .   Multiple Vitamin (MULTIVITAMIN) tablet, Take 1 tablet by mouth daily., Disp: , Rfl:  .  Naproxen Sodium (ALEVE) 220 MG CAPS, Take 1-2 capsules by mouth as needed., Disp: , Rfl:  .  omeprazole (PRILOSEC) 20 MG capsule, Take 1 capsule (20 mg total) by mouth daily., Disp: 30 capsule, Rfl: 5 .  pravastatin (PRAVACHOL) 40 MG tablet, Take 0.5 tablets (20 mg total) by mouth daily., Disp: 45 tablet, Rfl: 0 .  predniSONE (DELTASONE) 5 MG tablet, Take 1 tablet (5 mg total) by mouth daily with breakfast., Disp: 90 tablet, Rfl: 2 .  Probiotic Product (PROBIOTIC ADVANCED PO), Take 1 capsule by mouth daily., Disp: , Rfl:  .  traMADol (ULTRAM) 50 MG tablet, Take 1-2 tablets (50-100 mg total) by mouth every 6 (six) hours as needed for moderate pain or severe pain., Disp: 60 tablet, Rfl: 0 .  Turmeric 500 MG CAPS, Take 1 capsule by mouth daily., Disp: , Rfl:      Review of Systems     Objective:   Physical Exam  Constitutional: She is oriented to person, place, and time. She appears well-developed and well-nourished. No distress.  HENT:  Head: Normocephalic and atraumatic.  Right Ear: External ear normal.  Left Ear: External ear normal.  Mouth/Throat: Oropharynx is clear and moist. No oropharyngeal exudate.  Eyes: Conjunctivae and EOM are normal. Pupils are equal, round, and reactive to light. Right eye exhibits no discharge. Left eye exhibits no discharge. No scleral icterus.  Neck: Normal range of motion. Neck supple. No JVD present. No tracheal deviation present. No thyromegaly present.  Cardiovascular: Normal rate, regular rhythm, normal heart sounds and intact distal pulses.  Exam reveals no gallop and no friction rub.  No murmur heard. Pulmonary/Chest: Effort normal. No respiratory distress. She has no wheezes. She has rales. She exhibits no tenderness.  Faint basal crackles only  Abdominal: Soft. Bowel sounds are normal. She exhibits no distension and no mass. There is no tenderness. There is no  rebound and no guarding.  Musculoskeletal: Normal range of motion. She exhibits no edema or tenderness.  Lymphadenopathy:    She has no cervical adenopathy.  Neurological: She is alert and oriented to person, place, and time. She has normal reflexes. No cranial nerve deficit. She exhibits normal muscle tone. Coordination normal.  Skin: Skin is warm and dry. No rash noted. She is not diaphoretic. No erythema. No pallor.  Psychiatric: She has a normal mood and affect. Her behavior is normal. Judgment and thought content normal.  Vitals reviewed.    Filed Vitals:   04/29/16 1344  BP: 122/68  Pulse: 66  Height: 5' (1.524 m)  Weight: 147 lb (66.679 kg)  SpO2: 95%   Estimated body mass index is 28.71 kg/(m^2) as calculated from the following:   Height as of this encounter: 5' (1.524 m).   Weight as of this encounter: 147 lb (66.679 kg).      Assessment:       ICD-9-CM ICD-10-CM   1. ILD (interstitial lung disease) ([place]) 515 J84.9   2. Hypersensitivity pneumonitis ([place]) 495.9 J67.9        Plan:      Glad you are better Diseas is progressive since 2013/2014 but  similar to early 2015 and improved from early 2017 Continue prednisone 73m per day FLu shot in fall   Followup 4-5 months - early fall 2017 or sooner if needed    Dr. MBrand Males M.D., FBapt[facility]iptonC.P Pulmonary and Critical Care Medicine Staff Physician [facility] and [facility] Pager: [phone] If no answer or between  15:00h - 7:00h: call [phone]  05/01/2016 6:58 AM

## 2016-05-08 ENCOUNTER — Other Ambulatory Visit: Payer: Self-pay | Admitting: Internal Medicine

## 2016-05-28 ENCOUNTER — Other Ambulatory Visit: Payer: Self-pay | Admitting: *Deleted

## 2016-05-28 ENCOUNTER — Telehealth: Payer: Self-pay | Admitting: Pediatrics

## 2016-05-28 ENCOUNTER — Other Ambulatory Visit: Payer: Self-pay

## 2016-05-28 MED ORDER — FLUTICASONE PROPIONATE 50 MCG/ACT NA SUSP: 2.0000 | Freq: Every day | NASAL | Status: DC

## 2016-05-28 MED ORDER — MONTELUKAST SODIUM 10 MG PO TABS: 10.0000 mg | ORAL_TABLET | Freq: Every day | ORAL | Status: DC

## 2016-05-28 NOTE — Telephone Encounter (Signed)
Pt called and needs to have fl[facility]asone [facility].[phone].

## 2016-05-28 NOTE — Telephone Encounter (Signed)
Called [facility] and cancelled the first refill and then sent in the [facility] right [facility]

## 2016-05-28 NOTE — Telephone Encounter (Signed)
Sent in refill will inform pt of refill

## 2016-05-28 NOTE — Telephone Encounter (Signed)
Informed pt of refill

## 2016-06-03 ENCOUNTER — Other Ambulatory Visit: Payer: Self-pay

## 2016-08-08 ENCOUNTER — Other Ambulatory Visit: Payer: Self-pay | Admitting: Family Medicine

## 2016-08-08 ENCOUNTER — Other Ambulatory Visit: Payer: Self-pay | Admitting: Physician Assistant

## 2016-08-11 ENCOUNTER — Other Ambulatory Visit: Payer: Self-pay | Admitting: Emergency Medicine

## 2016-08-14 ENCOUNTER — Other Ambulatory Visit: Payer: Self-pay | Admitting: Emergency Medicine

## 2016-08-14 MED ORDER — METOPROLOL SUCCINATE ER 50 MG PO TB24: ORAL_TABLET | ORAL | 0 refills | Status: DC

## 2016-08-20 DIAGNOSIS — H35372 Puckering of macula, left eye: Secondary | ICD-10-CM | POA: Diagnosis not present

## 2016-08-24 DIAGNOSIS — Z23 Encounter for immunization: Secondary | ICD-10-CM | POA: Diagnosis not present

## 2016-08-25 ENCOUNTER — Other Ambulatory Visit: Payer: Self-pay | Admitting: *Deleted

## 2016-08-25 MED ORDER — MONTELUKAST SODIUM 10 MG PO TABS: 10.0000 mg | ORAL_TABLET | Freq: Every day | ORAL | 0 refills | Status: DC

## 2016-08-31 ENCOUNTER — Other Ambulatory Visit: Payer: Self-pay | Admitting: Internal Medicine

## 2016-08-31 MED ORDER — FLUTICASONE FUROATE-VILANTEROL 100-25 MCG/INH IN AEPB: 1.0000 | INHALATION_SPRAY | Freq: Every day | RESPIRATORY_TRACT | 1 refills | Status: DC

## 2016-08-31 NOTE — Telephone Encounter (Signed)
Last ov w/ MR 6.7.17: Patient Instructions      ICD-9-CM ICD-10-CM    1. ILD (interstitial lung disease) ([place]) 515 J84.9    2. Hypersensitivity pneumonitis ([place]) 495.9 J67.9      Glad you are better Diseas is progressive since 2013/2014 but  similar to early 2015 and improved from early 2017 Continue prednisone 5mg  per day FLu shot in fall   Followup 4-5 months - early fall 2017 or sooner if needed             - walk test at followup   Pt has upcoming appt with MR for 12.1.17 Called spoke with patient who is asking for refills on her Breo, Flonase and Singulair.  Pt stated that her allergist Dr Shaune Leeks has been filling her Flonase and Singulair, but she feels that she does not need to continue to follow up with him because she has been 'fine.'  She is asking that MR take these meds over.  Pt sees MR for cough and ILD.  Pt is aware her [facility] will go ahead and be filled.  She would like 90 day supplies.  MR please advise, thank you.

## 2016-09-01 ENCOUNTER — Other Ambulatory Visit: Payer: Self-pay | Admitting: Pediatrics

## 2016-09-03 ENCOUNTER — Other Ambulatory Visit: Payer: Self-pay | Admitting: Internal Medicine

## 2016-09-04 NOTE — Telephone Encounter (Signed)
MR please advise thanks  

## 2016-09-07 NOTE — Telephone Encounter (Signed)
MR, please advise if ok to refill these prescription for 90 days prior to her 10/23/16 appt.

## 2016-09-09 NOTE — Telephone Encounter (Signed)
MR please advise. Thanks! 

## 2016-09-11 NOTE — Telephone Encounter (Signed)
MR please advise thanks  

## 2016-09-15 NOTE — Telephone Encounter (Signed)
MR please advise if we can refill this medication.

## 2016-09-18 NOTE — Telephone Encounter (Signed)
MR please advise on this message if any testing needs to be done prior to her appt in December and if we can do the 90 day supply of singulair.  thanks

## 2016-09-21 DIAGNOSIS — H25011 Cortical age-related cataract, right eye: Secondary | ICD-10-CM | POA: Diagnosis not present

## 2016-09-21 DIAGNOSIS — H35372 Puckering of macula, left eye: Secondary | ICD-10-CM | POA: Diagnosis not present

## 2016-09-21 DIAGNOSIS — H2511 Age-related nuclear cataract, right eye: Secondary | ICD-10-CM | POA: Diagnosis not present

## 2016-09-21 DIAGNOSIS — Z0100 Encounter for examination of eyes and vision without abnormal findings: Secondary | ICD-10-CM | POA: Diagnosis not present

## 2016-09-25 NOTE — Telephone Encounter (Signed)
Dr. Chase Caller please advise on refills.  Thank you.

## 2016-09-27 ENCOUNTER — Other Ambulatory Visit: Payer: Self-pay | Admitting: Pediatrics

## 2016-09-28 DIAGNOSIS — Z471 Aftercare following joint replacement surgery: Secondary | ICD-10-CM | POA: Diagnosis not present

## 2016-09-28 DIAGNOSIS — Z96641 Presence of right artificial hip joint: Secondary | ICD-10-CM | POA: Diagnosis not present

## 2016-09-28 DIAGNOSIS — M1612 Unilateral primary osteoarthritis, left hip: Secondary | ICD-10-CM | POA: Diagnosis not present

## 2016-09-28 NOTE — Telephone Encounter (Signed)
MR please advise thanks  

## 2016-10-01 MED ORDER — MONTELUKAST SODIUM 10 MG PO TABS: 10.0000 mg | ORAL_TABLET | Freq: Every day | ORAL | 0 refills | Status: DC

## 2016-10-01 NOTE — Telephone Encounter (Signed)
Pt has a pending appt with MR on 12/1.  1 refill of the singulair was sent in to the pharmacy.  Nothing further is needed.

## 2016-10-08 ENCOUNTER — Telehealth: Payer: Self-pay | Admitting: Internal Medicine

## 2016-10-08 NOTE — Telephone Encounter (Signed)
Rec'd request for records from [facility]. I faxed 26 pages and confirmed with Rhetta Mura that they did receive 26 pages

## 2016-10-23 ENCOUNTER — Ambulatory Visit (INDEPENDENT_AMBULATORY_CARE_PROVIDER_SITE_OTHER): Payer: Medicare Other | Admitting: Internal Medicine

## 2016-10-23 ENCOUNTER — Ambulatory Visit: Payer: Medicare Other | Admitting: Internal Medicine

## 2016-10-23 ENCOUNTER — Encounter: Payer: Self-pay | Admitting: Internal Medicine

## 2016-10-23 VITALS — BP 124/62 | HR 86 | Ht 60.0 in | Wt 150.4 lb

## 2016-10-23 DIAGNOSIS — J849 Interstitial pulmonary disease, unspecified: Secondary | ICD-10-CM | POA: Diagnosis not present

## 2016-10-23 DIAGNOSIS — J679 Hypersensitivity pneumonitis due to unspecified organic dust: Secondary | ICD-10-CM

## 2016-10-23 NOTE — Patient Instructions (Addendum)
ICD-9-CM ICD-10-CM   1. ILD (interstitial lung disease) ([place]) 515 J84.9   2. Hypersensitivity pneumonitis ([place]) 495.9 J67.9    Glad you are getting over acute bronchitis and disease is stable Glad you are uptodate with flu shot Refill breo - take samples Respect decision to turn down Ofev trial at [facility] due to concern of GI side effects Respect decision to turn down self-pay prifenidone from [place] For travel to [place]  - be careful with over-exertion  - any respiratory distress - contact me on whatsapp, cell phone, email  -for any illness ->  take cephalexin 500mg  tid x 5 days and Take prednisone 40 mg daily x 2 days, then 20mg  daily x 2 days, then 10mg  daily x 2 days, then 5mg  daily x 2 days and stop Talk to PCP [facility] L[facility] MD about new shingles vaccine  Followup  -in 3 months:  Pre-bd spiro and dlco only. No lung volume or bd response. No post-bd spiro - return to see me in 3 months after pft test

## 2016-10-23 NOTE — Progress Notes (Signed)
Subjective:     Patient ID: Rhonda [name], female   DOB: [birth year], 65 y.o.   MRN: [id number]  HPI     #GE reflux with small hiatal hernia  - on ppi  #MEtabolic Syndrome  - On low glycemic diet  159#  on 09/19/12 147# on 11/21/12 145# on 01/02/13 145# on 02/28/2013 147# - on 05/25/2013 154# - 09/18/14 147# - June 2017    #History of rapid heart rate not otherwise specified  - Start her on Lopressor  2008/2009 by primary care physician. History appears to correlate with onset of respiratory issues  - refuses to dc this drug as of 2014 discussion   # Hypersensitivity Pneumonitis and ILD  - Potential etiologies - cockateil x 2 for 18 years through end 2012. 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 Denies mold but lives in house built in [place] with a "weird baselment: and has humidifier  - first noted on CT chest 10/15/09 following trip to [place] (PE negative) but not described in 2003 CT chest report  - autoimmune panel: 10/23/11: Negative  -  Uderwennt bronch 11/19/11  - non diagnostic  - VATS Nov 2013 (done after initially refusing)- [place]. However, there is worrying trend of UIP pattern in the Upper lobes.            Oct/Nov 2012  March 2013 08/11/12  Nov/Dec 2013 Dx HP/UIP 01/02/2013  02/28/13 05/25/2013  12/06/2013  03/15/2014  06/29/2014  10'27/15 12/19/2014  02/26/15 01/13/2016  04/29/2016  10/23/2016   Symp/Signs Dyspnea x5y  New crackles  dimished dyspnea except at hill. Improved crackles.  Improved dyspnea except @ hill            Fev1  1.9 L   1.7L/90%  1.66 L/87%   1.66 L/78%  1.75L/90% 1.68L/82% 1.56/75 1.6L/78% 1.48/75% 1.58L/77%  1.52L/78% 1.39/72% (jan) 1.61L/80%   FVC  2.6 L    2.2 L/82%   2.1 L/80%   2.19 L/82%  2.2L/91% 2.1/81% 1.86/69% 1.9L/71% 1.86/75% 1.75L/65% 1.82/73% (dec 2015 offce spiro) 2.84L/72% 1.54/61% 1.78L/67%   Ratio     79    76/95%  80/99% 80/100%  84  90       fef 25-75%                  TLC  4.3 L    3.3 L/79%  3.5 L/82%     3.22/72% 3.06L/68%  3.06/68%  3.78/87% 2.86/66% 2/98L/66%   DLCO  10/50%    10/51%   9.6/51%     11.9/63% 11.7/62%  11.561%  5.5/31% 10.06/57% 11/04/63%   Walk test 185 ft x 3 laps on RA  rest 92%. Pk exertion 90%. Pk HR 108 Rest 92%. Pk exertion 86% at 2.5 laps. Pk HR 102  pk exertion - 88% Rest 96%. Pk 95%. Pk HR is 75. Done at full dose lopressor Rest 94%. Pk 91%. Rest HR 93 with pk HR 103/min. Done at half dose lopressor REst 96%. Pk pulse ox 92%. Rest HR 65. Pk HR 86 Did not desat. Normal Pk HR  Pulse ox 96%, droped to89% at 2nd lap but bounced up and ended at 91% 3rd lap Did not desat Did not desat, Pk HHR 93  ;llowest pulse ox 90% at 2nd lap but she held her breath, then 92% at 3rd lap 99% -> 91% at end of 3rd lap 94%, Dropped  to 87% at 3rd lap, HR peak102/m  CT chest   unchanged          yes Ct mid dec possibly worse - unsure Ct - similar to April 2016 but worse since 2013   Tests/Bx Bronch 0- non diagnostic   VATS - HP with UIP in Upper lobe              RX    Rx pred start 11/23/12 On  pred '30mg'$  per day and will reduce to '20mg'$  On Pred '20mg'$  per day On pred '10mg'$  per day On pred '5mg'$  per day Rx pred '5mg'$  daily She reduced to pred '5mg'$  x past 1 month QOD due to GI concern side effects On pred '5mg'$  qod but will increase to 5/d after this visit On pred '5mg'$  daily   On pred '5mg'$  daily                       OV 12/19/2014  Chief Complaint  Patient presents with  . Follow-up    Pt stated she is much improved since being sick. Pt c/o DOE d/t cold weather, prod cough with white mucus, PND and epigastric pain.     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 Now cough has improved to near baseline.  Stil bothered by the residual cough which seems to have significant neurogenic component (cleras throat,  gag etc., ). Dyspnea on exertion is class 2 and stable compated to last visit with stable spirometry and walking desat test. However, husband feels over time pst few years dyspnea is progressive. She is very afraid of even the mildes incline. Swimming poses problem for dyspnea and cough. Never been to rehab due to daily swimming. Now worried that dyspnea/cough can impact desire to travel abroad. Also, seems to be having non specific msk tupe right chest wall pain for many months.   Past, Family, Social reviewed: no change since last visit   OV 02/26/2015  Chief Complaint  Patient presents with  . Follow-up    Pt here after PFT and HRCT. Pt stated her breathing is better but her allergies are bothersome. Pt c/o prod cough with white mucus, sinus congestion, rhinorrhea. Pt stated her cough is better when she lays down.     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 AT that time big issue was severe cough which was felt largely cough hyperesnitvity due to fall 2015 viral infection. She says that slowly resolved. Wit that dyspnea improved too. Last week at [place] she climbed stairs etc., better than ever before. However, past week pollen level high and having cough again though not as bad. She is takng nasal steroid, OTC antihistamine x 2 and his helping. Dyspnea itself is stble. Cntinues pred '5mg'$  per day. [facility]d [facility] lecture [facility]. Planning [place] trip in summer 2016. 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 The chronic cough is resolved. [facility] trip went well. Dyhspnea is mild , on exertion , relieved by rest and stable withoiut change. Continues prednisone '5mg'$  per day. She is is due for Hip surgery with Dr Alvan Dame in Oct 2017. She has lot of questions that she wants me to talk to DR Alvan Dame  about. I spoke to him several days after this visit when he called and resolved questions.  Overall low risk for pulmonary complications after hip surgery  Spiro FVC 1.8L and walking ddesat test - no desat in office  Suggests stability\   OV 10/11/2015  Chief Complaint  Patient presents with  . Follow-up    Pt c/o increaes in SOB, desaturation with activity. Pt c/o recent cough with yellow mucus in morning and white mucus throughout the day. Pt c/o pain under right breast when twisting or bending over. Pt stated she had a right hip replacement.    follow-up interstitial lung disease due to hypersensitivity pneumonitis on baseline prednisone 5 mg per day  She had hip surgery 09/09/2015 and has had an uneventful postoperative course. She is ambulating slowly and her pain is resolved. 10 days ago started having worsening cough similar to 4 2015 when she had acute bronchitis which degenerated into chronic irritable larynx syndrome and chronic cough. Then some 3 days ago the sputum changed green in color associated with right-sided musculoskeletal chest pain at the site of surgical lung biopsy incision from the past. Of note she has a history of pneumothorax of the right side but a spontaneous but on the same side a surgical lung biopsy. No specific fever or hemoptysis or leg swelling. She's also noticed increased desaturations to 88% at home but here walking desaturation test she did get dyspneic 185 feet 3 laps on room air. She had more dyspnea than usual but she did not desaturate below 92% but did get tachycardic. She does feel better since yesterday  OV 01/13/2016  Chief Complaint  Patient presents with  . Follow-up    Pt c/o continued cough with clear/white mucus and SOB with exertion. Pt also c/o increasing pain under each breast that shoots through to her back.    Follow-up interstitial lung disease due to hypersensitivity pneumonitis on baseline prednisone 5 mg per day  Since last visit 3 months ago in November 2016 she continues to have severe chronic cough. This really has not  gone away. It is completely consuming her quality of life. At last visit in mid November 2016 we did a day prednisone burst along with doxycycline but it did not help. Following this did CT sinus that was normal early December 2016. Did high resolution CT chest and it suggested that the groundglass opacities with slightly prominent and she was possibly worse. Then in mid December 2016 as a result we did a 3 week prednisone burst. This again did not help the cough. Right at the end of this prednisone taper and gender 2017 did a pulmonary function test. The shows decline in her lung function but she does not feel decline in terms of dyspnea and she says that the PFT was hard because of her cough and she attributes the worsening due to cough and performance issues. Overall her dyspnea is unchanged and she is at baseline. When she climbs the stairs she desaturates to 85% at home but she says this is baseline.  Cough is the main thing that is consuming her. This cough is severe. Happens at the end of swimming when she does of breath hold. Also happens when she talks or laughs. Cough does not bother her when she sleeps and she is able to sleep through the night. Associated with significant sputum. This. It was mostly clear. There is a constant sensation of tickle in the throat or something in the throat. Her voice is  hoarse periodically. Cough is also associated with significant right-sided musculoskeletal stabbing pain on the side of the biopsy. She has recently seen allergist  Dr Angelica Pou and she is allergic to many things including dust and mold. She says there is no mold in the house anymore. She is now on Singulair and to antihistamines. But these have not helped. In fact she says that when she stopped taking her antihistamines for allergy testing the cough did not change at all. She questions the usefulness of these medications and helping her cough. She continues with acid reflux treatment unchanged. RSI cough  score is 17 and reflects irritable larynx syndrome. Cough differentiator score performed and detailed below and suggests that she does indeed have irritable larynx syndrome. Exhaled nitric oxide test today done in the office is normal and suggests he does not not have eosinophilic airway inflammation   OV 02/20/2016  Chief Complaint  Patient presents with  . Follow-up    Pt states her cough has improved since last OV. Pt states her cough starts to worsen towards 5pm each day. Pt states she was exposed to car exhaust and states her cough worsened for 1 week but now feels better.    Follow-up chronic cough in the setting of interstitial lung disease/hypersensitivity pneumonitis. On daily prednisone 5 mg  Last visit was approximately 5 weeks ago. At that time she had worsening cough. He just was strongly consistent with cough neuropathy/irritable larynx syndrome. I started on gabapentin at the last visit. In the interim I keep in touch with her. She reported that as of a few weeks ago cough is significantly improved just a mild level. Therefore she decided to skip on speech therapy. She continues with prednisone 5 mg per day. Then approximately 10 days ago she got exposed to diesel fume and then a week ago she came off Singulair due to allergy testing needs with her allergist Dr Angelica Pou. This then resulted in decompensation of the cough and the cough is significantly worse. However the cough is better than 4 or 5 weeks ago. RSI cough score is 15 shows only it is marginally better right now than last visit despite gabapentin. In talking to her features are still fully consistent with irritable larynx syndrome. She does not have worsening dyspnea when she swims or she walks  She is frustrated by symptoms particularly because she is significant nasal drainage and bring up sputum. She does not believe the interstitial lung diseases any worse. She feels albuterol does help her cough    OV 04/29/2016  Chief  Complaint  Patient presents with  . Follow-up    Review CT and PFT.  Pt doing well, cough has improved since last ov.     FU ILD due to HP   Feels well. Says cough is not a problem. Says she is entereing best season for her. Last 2 winter cycle 2015-2016 and 2016-2017 bothered by horrible cough with irrtiable laynrhx component after acute sinus/bronchitis. PFT and CT on her daily 44m per day pred has improved and back to 2015 baseline levels. Disease state some worse compared to early 2014 when she was on higher pred dose but similar to 2015 when she has been on low dose pred. She prefers to stay on low dose pred.    OV 10/23/2016  Chief Complaint  Patient presents with  . Follow-up    Pt states she was doing well until she caught a cold a few weeks ago. Pt states she is improved  now but not back to baseline. Pt states her SOB is still a little worse than baseline. Pt states her cough has improved and is back to baseline.    Follow-up interstitial lung disease secondary to hypersensitivity pneumonitis with UIP pattern  Rhonda [name] reports continued stability. In the last 3 years she seems to cycle and respiratory infections with horrible cough that is partially associated with irritable larynx syndrome in the winter months and then she has great months in the spring and summer. Around Thanksgiving 2017 a sometime before that she had another respiratory infection and is recovering from a cough. She treated this with over-the-counter medications. Otherwise continues to be hoarse but is much improved. She feels chewing gum has improved the interim significantly. She says that dyspnea stable but when we walked her 185 feet 3 laps on room air: She dropped to 87% just one of her worse performances but she feels this is because of the recent respiratory infection and subjectively she feels stable. She is up-to-date with flu shot. She is planning a [place] in [place]. Her daughter Cristal Generous  is a student there    has a past medical history of Allergy; Arthritis; Asthma; Cataract; Depression; DOE (dyspnea on exertion); GERD (gastroesophageal reflux disease); H/O steroid therapy; Heart palpitations (02/28/2015); Helicobacter pylori ab+; Hiatal hernia; High cholesterol; History of chronic bronchitis; Hyperplastic colon polyp (2007); Inguinal hernia; Insulin resistance; Interstitial lung disease ([place]); MVP (mitral valve prolapse); Pneumonitis, hypersensitivity ([place]); Pulmonary fibrosis ([place]); Pulmonary HTN; and Rapid heart rate.   reports that she quit smoking about 45 years ago. She started smoking about 46 years ago. She has never used smokeless tobacco.  Past Surgical History:  Procedure Laterality Date  . BREAST BIOPSY Right 2009   BIOPSY  . CATARACT EXTRACTION Left   . CESAREAN SECTION    . FOOT FRACTURE SURGERY  2006 or 2007   right  . HYMENECTOMY    . LUNG BIOPSY  09/28/2012   Procedure: LUNG BIOPSY;  Surgeon: Melrose Nakayama, MD;  Location: [facility];  Service: Thoracic;  Laterality: N/A;  lung biopsies tims three  . TOTAL HIP ARTHROPLASTY Right 09/10/2015   Procedure: RIGHT TOTAL HIP ARTHROPLASTY ANTERIOR APPROACH;  Surgeon: Paralee Cancel, MD;  Location: [facility];  Service: Orthopedics;  Laterality: Right;  . TUBAL LIGATION    . VIDEO ASSISTED THORACOSCOPY  09/28/2012   Procedure: VIDEO ASSISTED THORACOSCOPY;  Surgeon: Melrose Nakayama, MD;  Location: [facility];  Service: Thoracic;  Laterality: Right;  Marland Kitchen VIDEO BRONCHOSCOPY  11/19/2011   Procedure: VIDEO BRONCHOSCOPY WITH FLUORO;  Surgeon: Brand Males, MD;  Location[facility];  Service: Endoscopy;;    Allergies  Allergen Reactions  . Atorvastatin Other (See Comments)    Leg pain  . Betadine [Povidone Iodine] Other (See Comments)    blisters  . Codeine Nausea And Vomiting  . Rosuvastatin Other (See Comments)    Leg pain  . Shellfish Allergy Other (See Comments)    vomiting  . Sulfonamide Derivatives Other (See  Comments)    headaches    Immunization History  Administered Date(s) Administered  . Hepatitis A 05/23/2010  . Hepatitis B 06/23/2006  . Influenza Split 08/11/2012  . Influenza Whole 09/08/2011  . Influenza, High Dose Seasonal PF 08/24/2016  . Influenza,inj,Quad PF,36+ Mos 09/07/2013  . Influenza-Unspecified 09/26/2014, 07/27/2015  . MMR 05/23/2010  . Pneumococcal Conjugate-13 03/15/2014  . Pneumococcal Polysaccharide-23 03/15/2014  . Td 02/22/2003  . Tdap 10/28/2012  . Zoster 12/04/2010  Family History  Problem Relation Age of Onset  . Emphysema Maternal Grandmother   . Asthma Maternal Grandmother   . Asthma Mother   . Osteoarthritis Mother   . Dementia Mother 37  . Lymphoma Father   . Diabetes Father   . Hypertension Sister   . Heart disease Sister   . Kidney disease Sister   . Lung disease Maternal Grandfather   . Breast cancer Paternal Aunt   . Ovarian cancer Maternal Aunt   . Bone cancer Paternal Grandfather   . Colon cancer Neg Hx   . Angioedema Neg Hx   . Eczema Neg Hx   . Immunodeficiency Neg Hx   . Urticaria Neg Hx   . Allergic rhinitis Daughter      Current Outpatient Prescriptions:  .  acetaminophen (TYLENOL) 325 MG tablet, Take 650 mg by mouth every 6 (six) hours as needed. Reported on 02/10/2016, Disp: , Rfl:  .  albuterol (PROAIR HFA) 108 (90 Base) MCG/ACT inhaler, Inhale 2 puffs into the lungs every 4 (four) hours as needed for wheezing. Or coughing spells.  You may use 2 Puffs 5-10 minutes before exercise., Disp: 1 Inhaler, Rfl: 3 .  Alpha-D-Galactosidase (BEANO PO), Take 1 tablet by mouth as needed (if eating onion or garlic). , Disp: , Rfl:  .  Ascorbic Acid (VITAMIN C) 1000 MG tablet, Take 1,000 mg by mouth daily., Disp: , Rfl:  .  Calcium Carb-Cholecalciferol (CALCIUM 1000 + D PO), Take 1 tablet by mouth daily., Disp: , Rfl:  .  chlorpheniramine (CHLOR-TRIMETON) 4 MG tablet, Take 4 mg by mouth daily as needed for allergies. , Disp: , Rfl:  .   dicyclomine (BENTYL) 10 MG capsule, Take 10 mg by mouth 3 (three) times daily as needed for spasms (stomach cramping). Reported on 01/06/2016, Disp: , Rfl:  .  EPINEPHrine (EPIPEN 2-PAK) 0.3 mg/0.3 mL IJ SOAJ injection, Inject 0.3 mLs (0.3 mg total) into the muscle once., Disp: 2 Device, Rfl: 2 .  fluticasone (FLONASE) 50 MCG/ACT nasal spray, PLACE 2 SPRAYS INTO BOTH NOSTRILS DAILY., Disp: 16 g, Rfl: 0 .  fluticasone furoate-vilanterol (BREO ELLIPTA) 100-25 MCG/INH AEPB, Inhale 1 puff into the lungs daily., Disp: 180 each, Rfl: 1 .  gabapentin (NEURONTIN) 300 MG capsule, TAKE 1 CAPSULE (300 MG TOTAL) BY MOUTH 3 (THREE) TIMES DAILY. TAKE AS DIRECTED, Disp: 90 capsule, Rfl: 5 .  lactase (LACTAID) 3000 UNITS tablet, Take 3,000 Units by mouth as needed (when eating ice cream or dariy). , Disp: , Rfl:  .  metoprolol succinate (TOPROL-XL) 50 MG 24 hr tablet, TAKE 1 TABLET BY MOUTH EVERY DAY IMMEDIATELY FOLLOWING A MEAL., Disp: 30 tablet, Rfl: 0 .  montelukast (SINGULAIR) 10 MG tablet, Take 1 tablet (10 mg total) by mouth daily., Disp: 30 tablet, Rfl: 0 .  Multiple Vitamin (MULTIVITAMIN) tablet, Take 1 tablet by mouth daily., Disp: , Rfl:  .  Naproxen Sodium (ALEVE) 220 MG CAPS, Take 1-2 capsules by mouth as needed., Disp: , Rfl:  .  omeprazole (PRILOSEC) 20 MG capsule, Take 1 capsule (20 mg total) by mouth daily., Disp: 30 capsule, Rfl: 5 .  pravastatin (PRAVACHOL) 40 MG tablet, Take 0.5 tablets (20 mg total) by mouth daily., Disp: 45 tablet, Rfl: 0 .  predniSONE (DELTASONE) 5 MG tablet, TAKE 1 TABLET BY MOUTH DAILY WITH BREAKFAST., Disp: 90 tablet, Rfl: 2 .  Probiotic Product (PROBIOTIC ADVANCED PO), Take 1 capsule by mouth daily., Disp: , Rfl:  .  Turmeric 500 MG CAPS,  Take 1 capsule by mouth daily., Disp: , Rfl:     Review of Systems     Objective:   Physical Exam  Constitutional: She is oriented to person, place, and time. She appears well-developed and well-nourished. No distress.  HENT:   Head: Normocephalic and atraumatic.  Right Ear: External ear normal.  Left Ear: External ear normal.  Mouth/Throat: Oropharynx is clear and moist. No oropharyngeal exudate.  Voice hoarser than usual  Eyes: Conjunctivae and EOM are normal. Pupils are equal, round, and reactive to light. Right eye exhibits no discharge. Left eye exhibits no discharge. No scleral icterus.  Neck: Normal range of motion. Neck supple. No JVD present. No tracheal deviation present. No thyromegaly present.  Cardiovascular: Normal rate, regular rhythm, normal heart sounds and intact distal pulses.  Exam reveals no gallop and no friction rub.   No murmur heard. Pulmonary/Chest: Effort normal. No respiratory distress. She has no wheezes. She has rales. She exhibits no tenderness.  basdal crackles and anterior crackles  Abdominal: Soft. Bowel sounds are normal. She exhibits no distension and no mass. There is no tenderness. There is no rebound and no guarding.  Musculoskeletal: Normal range of motion. She exhibits no edema or tenderness.  Lymphadenopathy:    She has no cervical adenopathy.  Neurological: She is alert and oriented to person, place, and time. She has normal reflexes. No cranial nerve deficit. She exhibits normal muscle tone. Coordination normal.  Skin: Skin is warm and dry. No rash noted. She is not diaphoretic. No erythema. No pallor.  Psychiatric: She has a normal mood and affect. Her behavior is normal. Judgment and thought content normal.  Vitals reviewed.   Vitals:   10/23/16 0914  BP: 124/62  Pulse: 86  SpO2: 96%  Weight: 150 lb 6.4 oz (68.2 kg)  Height: 5' (1.524 m)   Estimated body mass index is 29.37 kg/m as calculated from the following:   Height as of this encounter: 5' (1.524 m).   Weight as of this encounter: 150 lb 6.4 oz (68.2 kg).      Assessment:       ICD-9-CM ICD-10-CM   1. ILD (interstitial lung disease) ([place]) 515 J84.9 Pulmonary function test  2. Hypersensitivity  pneumonitis ([facility]) 495.9 J67.9 Pulmonary function test       Plan:      Glad you are getting over acute bronchitis and disease is stable Glad you are uptodate with flu shot Refill breo - take samples Respect decision to turn down Ofev trial at [facility] due to concern of GI side effects Respect decision to turn down self-pay prifenidone from [place] For travel to [place]  - be careful with over-exertion  - any respiratory distress - contact me on whatsapp, cell phone, email  -for any illness ->  take cephalexin '500mg'$  tid x 5 days and Take prednisone 40 mg daily x 2 days, then '20mg'$  daily x 2 days, then '10mg'$  daily x 2 days, then '5mg'$  daily x 2 days and stop Talk to PCP [facility] L[facility] MD about new shingles vaccine  Followup  -in 3 months:  Pre-bd spiro and dlco only. No lung volume or bd response. No post-bd spiro - return to see me in 3 months after pft test   (> 50% of this 15 min visit spent in face to face counseling or/and coordination of care)   Dr. Brand Males, M.D., [facility].C.P Pulmonary and Critical Care Medicine Staff Physician [facility] Pager: [phone]  [phone], If no answer or between  15:00h - 7:00h: call [phone]  10/23/2016 9:49 AM

## 2016-10-26 ENCOUNTER — Telehealth: Payer: Self-pay | Admitting: Internal Medicine

## 2016-10-26 MED ORDER — PREDNISONE 10 MG PO TABS: ORAL_TABLET | ORAL | 0 refills | Status: DC

## 2016-10-26 MED ORDER — CEPHALEXIN 500 MG PO CAPS: 500.0000 mg | ORAL_CAPSULE | Freq: Three times a day (TID) | ORAL | 0 refills | Status: DC

## 2016-10-26 NOTE — Telephone Encounter (Signed)
Rx sent to preferred pharmacy. Pt aware and voiced understanding. Nothing further needed.  

## 2016-12-01 ENCOUNTER — Other Ambulatory Visit: Payer: Self-pay | Admitting: Internal Medicine

## 2016-12-02 ENCOUNTER — Other Ambulatory Visit: Payer: Self-pay | Admitting: Internal Medicine

## 2016-12-02 NOTE — Telephone Encounter (Signed)
Pt requesting refills for gabepentin 300mg . Rx last refilled 02/21/16 with 5 refills. MR please clarify if you would like pt to continue this medication. Thanks

## 2016-12-03 ENCOUNTER — Telehealth: Payer: Self-pay | Admitting: Internal Medicine

## 2016-12-03 ENCOUNTER — Other Ambulatory Visit: Payer: Self-pay

## 2016-12-03 MED ORDER — ALBUTEROL SULFATE HFA 108 (90 BASE) MCG/ACT IN AERS: 2.0000 | INHALATION_SPRAY | RESPIRATORY_TRACT | 3 refills | Status: DC | PRN

## 2016-12-03 MED ORDER — ALBUTEROL SULFATE HFA 108 (90 BASE) MCG/ACT IN AERS
INHALATION_SPRAY | RESPIRATORY_TRACT | 3 refills | Status: DC
Start: 2016-12-03 — End: 2017-01-25

## 2016-12-03 NOTE — Telephone Encounter (Signed)
rx sent to pharmacy.  Nothing further needed.  

## 2016-12-03 NOTE — Telephone Encounter (Signed)
MR we don't have samples of albuterol or proair. Is it fine to just send in a prescription? Thanks.

## 2016-12-03 NOTE — Telephone Encounter (Signed)
Triage  She just came back from [place]. Please give her albuterol sample or proair and send to pharmacy too. I forgot yesterday  Thanks  Dr. Brand Males, M.D., [facility].C.P Pulmonary and Critical Care Medicine Staff Physician [place] [facility] Pager: [phone], If no answer or between  15:00h - 7:00h: call [phone]  12/03/2016 1:43 PM

## 2016-12-03 NOTE — Telephone Encounter (Signed)
Yes please send to pharmacy  Thanks  Dr. Brand Males, M.D., [facility].C.P Pulmonary and Critical Care Medicine Staff Physician [place] [facility] Pager: [phone], If no answer or between  15:00h - 7:00h: call [phone]  12/03/2016 5:22 PM

## 2016-12-21 ENCOUNTER — Other Ambulatory Visit: Payer: Self-pay | Admitting: Family Medicine

## 2016-12-21 DIAGNOSIS — Z1231 Encounter for screening mammogram for malignant neoplasm of breast: Secondary | ICD-10-CM

## 2016-12-25 ENCOUNTER — Other Ambulatory Visit: Payer: Self-pay

## 2016-12-25 MED ORDER — PRAVASTATIN SODIUM 40 MG PO TABS: 20.0000 mg | ORAL_TABLET | Freq: Every day | ORAL | 0 refills | Status: DC

## 2016-12-25 NOTE — Telephone Encounter (Signed)
[id number] last ov with [facility]

## 2016-12-28 ENCOUNTER — Telehealth: Payer: Self-pay | Admitting: Internal Medicine

## 2016-12-28 MED ORDER — GABAPENTIN 300 MG PO CAPS: ORAL_CAPSULE | ORAL | 5 refills | Status: DC

## 2016-12-28 NOTE — Telephone Encounter (Signed)
Spoke with pt. She is needing a refill on Gabapentin. Rx has been sent in. Nothing further was needed.

## 2016-12-28 NOTE — Telephone Encounter (Signed)
Rhonda [name] just contacted me . She sayss she is needing gabapentin and [facility] has called our office x 2. Please authorize it 12/28/2016 . Whatever prior dose was  Thanks  Dr. Brand Males, M.D., [facility].C.P Pulmonary and Critical Care Medicine Staff Physician [place] [facility] Pager: [phone], If no answer or between  15:00h - 7:00h: call [phone]  12/28/2016 11:26 AM   g

## 2017-01-05 DIAGNOSIS — H25011 Cortical age-related cataract, right eye: Secondary | ICD-10-CM | POA: Diagnosis not present

## 2017-01-05 DIAGNOSIS — H25811 Combined forms of age-related cataract, right eye: Secondary | ICD-10-CM | POA: Diagnosis not present

## 2017-01-05 DIAGNOSIS — H2511 Age-related nuclear cataract, right eye: Secondary | ICD-10-CM | POA: Diagnosis not present

## 2017-01-07 ENCOUNTER — Other Ambulatory Visit: Payer: Self-pay | Admitting: Internal Medicine

## 2017-01-07 MED ORDER — OSELTAMIVIR PHOSPHATE 75 MG PO CAPS: 75.0000 mg | ORAL_CAPSULE | Freq: Every day | ORAL | 0 refills | Status: DC

## 2017-01-14 ENCOUNTER — Ambulatory Visit
Admission: RE | Admit: 2017-01-14 | Discharge: 2017-01-14 | Disposition: A | Discharge status: Home / Self Care | Payer: Medicare Other | Source: Ambulatory Visit | Attending: Family Medicine | Admitting: Family Medicine

## 2017-01-14 DIAGNOSIS — Z1231 Encounter for screening mammogram for malignant neoplasm of breast: Secondary | ICD-10-CM | POA: Diagnosis not present

## 2017-01-25 ENCOUNTER — Ambulatory Visit (INDEPENDENT_AMBULATORY_CARE_PROVIDER_SITE_OTHER): Payer: Medicare Other | Admitting: Internal Medicine

## 2017-01-25 ENCOUNTER — Encounter: Payer: Self-pay | Admitting: Internal Medicine

## 2017-01-25 DIAGNOSIS — J849 Interstitial pulmonary disease, unspecified: Secondary | ICD-10-CM | POA: Diagnosis not present

## 2017-01-25 LAB — PULMONARY FUNCTION TEST
DL/VA % PRED: 92 %
DL/VA: 3.9 ml/min/mmHg/L
DLCO COR: 10.05 ml/min/mmHg
DLCO UNC % PRED: 55 %
DLCO cor % pred: 53 %
DLCO unc: 10.43 ml/min/mmHg
FEF 25-75 Pre: 2.44 L/sec
FEF2575-%Pred-Pre: 137 %
FEV1-%Pred-Pre: 77 %
FEV1-Pre: 1.53 L
FEV1FVC-%PRED-PRE: 119 %
FEV6-%Pred-Pre: 68 %
FEV6-Pre: 1.69 L
FEV6FVC-%PRED-PRE: 104 %
FVC-%PRED-PRE: 65 %
FVC-Pre: 1.69 L
PRE FEV6/FVC RATIO: 100 %
Pre FEV1/FVC ratio: 90 %

## 2017-01-25 MED ORDER — PREDNISONE 10 MG PO TABS: 10.0000 mg | ORAL_TABLET | Freq: Every day | ORAL | 1 refills | Status: DC

## 2017-01-25 NOTE — Progress Notes (Signed)
Pf testing  Dr. Brand Males, M.D., [facility].C.P Pulmonary and Critical Care Medicine Staff Physician [place] [facility] Pager: [phone], If no answer or between  15:00h - 7:00h: call [phone]  01/25/2017 12:45 PM

## 2017-01-25 NOTE — Assessment & Plan Note (Addendum)
Results for Rhonda [name], Rhonda [name] (MRN [id number]) as of 01/25/2017 11:31  Ref. Range 03/15/2014 15:56 09/18/2014 11:41 02/26/2015 09:02 12/16/2015 16:31 04/29/2016 12:44 01/25/2017 10:16  FVC-Pre Latest Units: L 1.91 1.73 1.84 1.42  1.69  FVC-%Pred-Pre Latest Units: % 71 64 72 56 67 65    Trend line on breathing tests possible slow decline over time as above  With radiology: Findings in summer 2017 appear similar to 02/22/2015 but are progressive from 08/16/2012  The good thing is you feel well and actually improved from few years ago  PLAN - increase prednisone from 5mg  to 10mg  per day - kee an eye on exertional pulse ox at home; if repated dipps to < 88% then call and we can set you with portable o2 esp for trips to mountains - we discussed ofev/esbriet either via trials or via [place] or changing dx to IPF: she is reluctant to go that route due to GI side effect   Followup In 3 months do : Pre-bd spiro and dlco only. No lung volume or bd response.  Return to see in 3 months: depending on course can consider HRCT

## 2017-01-25 NOTE — Patient Instructions (Signed)
ILD (interstitial lung disease) Results for Rhonda [name], Rhonda [name] (MRN [id number]) as of 01/25/2017 11:31  Ref. Range 03/15/2014 15:56 09/18/2014 11:41 02/26/2015 09:02 12/16/2015 16:31 04/29/2016 12:44 01/25/2017 10:16  FVC-Pre Latest Units: L 1.91 1.73 1.84 1.42  1.69  FVC-%Pred-Pre Latest Units: % 71 64 72 56 67 65    Trend line on breathing tests possible slow decline over time as above  With radiology: Findings in summer 2017 appear similar to 02/22/2015 but are progressive from 08/16/2012  The good thing is you feel well and actually improved from few years ago  PLAN - increase prednisone from 5mg  to 10mg  per day   Followup In 3 months do : Pre-bd spiro and dlco only. No lung volume or bd response.  Return to see in 3 months: depending on course can consider HRCT

## 2017-01-25 NOTE — Progress Notes (Signed)
Subjective:     Patient ID: Rhonda [name], female   DOB: 01/21/1951, 66 y.o.   MRN: [id number]  HPI    #GE reflux with small hiatal hernia  - on ppi  #MEtabolic Syndrome  - On low glycemic diet  159#  on 09/19/12 147# on 11/21/12 145# on 01/02/13 145# on 02/28/2013 147# - on 05/25/2013 154# - 09/18/14 147# - June 2017    #History of rapid heart rate not otherwise specified  - Start her on Lopressor  2008/2009 by primary care physician. History appears to correlate with onset of respiratory issues  - refuses to dc this drug as of 2014 discussion   # Hypersensitivity Pneumonitis and ILD  - Potential etiologies - cockateil x 2 for 18 years through end 2012. 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 Denies mold but lives in house built in [place] with a "weird baselment: and has humidifier  - first noted on CT chest 10/15/09 following trip to [place] (PE negative) but not described in 2003 CT chest report  - autoimmune panel: 10/23/11: Negative  -  Uderwennt bronch 11/19/11  - non diagnostic  - VATS Nov 2013 (done after initially refusing)- [facility]. However, there is worrying trend of UIP pattern in the Upper lobes.            Oct/Nov 2012  March 2013 08/11/12  Nov/Dec 2013 Dx HP/UIP 01/02/2013  02/28/13 05/25/2013  12/06/2013  03/15/2014  06/29/2014  10'27/15 12/19/2014  02/26/15 01/13/2016  04/29/2016  10/23/2016  01/25/2017   Symp/Signs Dyspnea x5y  New crackles  dimished dyspnea except at hill. Improved crackles.  Improved dyspnea except @ hill             Fev1  1.9 L   1.7L/90%  1.66 L/87%   1.66 L/78%  1.75L/90% 1.68L/82% 1.56/75 1.6L/78% 1.48/75% 1.58L/77%  1.52L/78% 1.39/72% (jan) 1.61L/80%  1.53L/77%  FVC  2.6 L    2.2 L/82%   2.1 L/80%   2.19 L/82%  2.2L/91% 2.1/81% 1.86/69% 1.9L/71% 1.86/75% 1.75L/65% 1.82/73% (dec 2015 offce spiro) 2.84L/72% 1.54/61% 1.78L/67%   1.69L/65%  Ratio     79   76/95%  80/99% 80/100%  84  90      90  TLC  4.3 L    3.3 L/79%  3.5 L/82%     3.22/72% 3.06L/68%  3.06/68%  3.78/87% 2.86/66% 2/98L/66%    DLCO  10/50%    10/51%   9.6/51%     11.9/63% 11.7/62%  11.561%  5.5/31% 10.06/57% 11/04/63%  10.43/55%  Walk test 185 ft x 3 laps on RA  rest 92%. Pk exertion 90%. Pk HR 108 Rest 92%. Pk exertion 86% at 2.5 laps. Pk HR 102  pk exertion - 88% Rest 96%. Pk 95%. Pk HR is 75. Done at full dose lopressor Rest 94%. Pk 91%. Rest HR 93 with pk HR 103/min. Done at half dose lopressor REst 96%. Pk pulse ox 92%. Rest HR 65. Pk HR 86 Did not desat. Normal Pk HR  Pulse ox 96%, droped to89% at 2nd lap but bounced up and ended at 91% 3rd lap Did not desat Did not desat, Pk HHR 93  ;llowest pulse ox 90% at 2nd lap but she held her breath, then 92% at 3rd lap 99% -> 91% at end of 3rd lap 94%, Dropped to 87% at 3rd lap, HR peak102/m 97%, dropped to 89% in  3rd lap - asymptomatic. HR 100/min  CT chest   unchanged          yes Ct mid dec possibly worse - unsure Ct - similar to April 2016 but worse since 2013    Tests/Bx Bronch 0- non diagnostic   VATS - HP with UIP in Upper lobe               RX    Rx pred start 11/23/12 On  pred 59m per day and will reduce to 246mOn Pred 202mer day On pred 32m78mr day On pred 5mg 71m day Rx pred 5mg d5my She reduced to pred 5mg x 53mt 1 month QOD due to GI concern side effects On pred 5mg qod32mt will increase to 5/d after this visit On pred 5mg dail43m On pred 5mg daily49mncrease pred to 32mg per d78m                     OV 12/19/2014  Chief Complaint  Patient presents with  . Follow-up    Pt stated she is much improved since being sick. Pt c/o DOE d/t cold weather, prod cough with white mucus, PND and epigastric pain.     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 Now cough has improved to near baseline.   Stil bothered by the residual cough which seems to have significant neurogenic component (cleras throat, gag etc., ). Dyspnea on exertion is class 2 and stable compated to last visit with stable spirometry and walking desat test. However, husband feels over time pst few years dyspnea is progressive. She is very afraid of even the mildes incline. Swimming poses problem for dyspnea and cough. Never been to rehab due to daily swimming. Now worried that dyspnea/cough can impact desire to travel abroad. Also, seems to be having non specific msk tupe right chest wall pain for many months.   Past, Family, Social reviewed: no change since last visit   OV 02/26/2015  Chief Complaint  Patient presents with  . Follow-up    Pt here after PFT and HRCT. Pt stated her breathing is better but her allergies are bothersome. Pt c/o prod cough with white mucus, sinus congestion, rhinorrhea. Pt stated her cough is better when she lays down.     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 AT that time big issue was severe cough which was felt largely cough hyperesnitvity due to fall 2015 viral infection. She says that slowly resolved. Wit that dyspnea improved too. Last week at [place] etc., better than ever before. However, past week pollen level high and having cough again though not as bad. She is takng nasal steroid, OTC antihistamine x 2 and his helping. Dyspnea itself is stble. Cntinues pred 5mg per [facility]59[facility]d [facility] [place] [facility] [place]. 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 The chronic cough is resolved. [place]. Dyhspnea is mild , on exertion , relieved by rest and stable withoiut change. Continues prednisone 5mg per day.101me is is due for Hip surgery with Dr Olin in Oct 2Alvan Dame She has lot of questions that  she wants me to  talk to DR Alvan Dame about. I spoke to him several days after this visit when he called and resolved questions. Overall low risk for pulmonary complications after hip surgery  Spiro FVC 1.8L and walking ddesat test - no desat in office  Suggests stability\   OV 10/11/2015  Chief Complaint  Patient presents with  . Follow-up    Pt c/o increaes in SOB, desaturation with activity. Pt c/o recent cough with yellow mucus in morning and white mucus throughout the day. Pt c/o pain under right breast when twisting or bending over. Pt stated she had a right hip replacement.    follow-up interstitial lung disease due to hypersensitivity pneumonitis on baseline prednisone 5 mg per day  She had hip surgery 09/09/2015 and has had an uneventful postoperative course. She is ambulating slowly and her pain is resolved. 10 days ago started having worsening cough similar to 4 2015 when she had acute bronchitis which degenerated into chronic irritable larynx syndrome and chronic cough. Then some 3 days ago the sputum changed green in color associated with right-sided musculoskeletal chest pain at the site of surgical lung biopsy incision from the past. Of note she has a history of pneumothorax of the right side but a spontaneous but on the same side a surgical lung biopsy. No specific fever or hemoptysis or leg swelling. She's also noticed increased desaturations to 88% at home but here walking desaturation test she did get dyspneic 185 feet 3 laps on room air. She had more dyspnea than usual but she did not desaturate below 92% but did get tachycardic. She does feel better since yesterday  OV 01/13/2016  Chief Complaint  Patient presents with  . Follow-up    Pt c/o continued cough with clear/white mucus and SOB with exertion. Pt also c/o increasing pain under each breast that shoots through to her back.    Follow-up interstitial lung disease due to hypersensitivity pneumonitis on baseline prednisone 5 mg per  day  Since last visit 3 months ago in November 2016 she continues to have severe chronic cough. This really has not gone away. It is completely consuming her quality of life. At last visit in mid November 2016 we did a day prednisone burst along with doxycycline but it did not help. Following this did CT sinus that was normal early December 2016. Did high resolution CT chest and it suggested that the groundglass opacities with slightly prominent and she was possibly worse. Then in mid December 2016 as a result we did a 3 week prednisone burst. This again did not help the cough. Right at the end of this prednisone taper and gender 2017 did a pulmonary function test. The shows decline in her lung function but she does not feel decline in terms of dyspnea and she says that the PFT was hard because of her cough and she attributes the worsening due to cough and performance issues. Overall her dyspnea is unchanged and she is at baseline. When she climbs the stairs she desaturates to 85% at home but she says this is baseline.  Cough is the main thing that is consuming her. This cough is severe. Happens at the end of swimming when she does of breath hold. Also happens when she talks or laughs. Cough does not bother her when she sleeps and she is able to sleep through the night. Associated with significant sputum. This. It was mostly clear. There is a constant sensation of tickle in the throat  or something in the throat. Her voice is hoarse periodically. Cough is also associated with significant right-sided musculoskeletal stabbing pain on the side of the biopsy. She has recently seen allergist  Dr Angelica Pou and she is allergic to many things including dust and mold. She says there is no mold in the house anymore. She is now on Singulair and to antihistamines. But these have not helped. In fact she says that when she stopped taking her antihistamines for allergy testing the cough did not change at all. She questions the  usefulness of these medications and helping her cough. She continues with acid reflux treatment unchanged. RSI cough score is 17 and reflects irritable larynx syndrome. Cough differentiator score performed and detailed below and suggests that she does indeed have irritable larynx syndrome. Exhaled nitric oxide test today done in the office is normal and suggests he does not not have eosinophilic airway inflammation   OV 02/20/2016  Chief Complaint  Patient presents with  . Follow-up    Pt states her cough has improved since last OV. Pt states her cough starts to worsen towards 5pm each day. Pt states she was exposed to car exhaust and states her cough worsened for 1 week but now feels better.    Follow-up chronic cough in the setting of interstitial lung disease/hypersensitivity pneumonitis. On daily prednisone 5 mg  Last visit was approximately 5 weeks ago. At that time she had worsening cough. He just was strongly consistent with cough neuropathy/irritable larynx syndrome. I started on gabapentin at the last visit. In the interim I keep in touch with her. She reported that as of a few weeks ago cough is significantly improved just a mild level. Therefore she decided to skip on speech therapy. She continues with prednisone 5 mg per day. Then approximately 10 days ago she got exposed to diesel fume and then a week ago she came off Singulair due to allergy testing needs with her allergist Dr Angelica Pou. This then resulted in decompensation of the cough and the cough is significantly worse. However the cough is better than 4 or 5 weeks ago. RSI cough score is 15 shows only it is marginally better right now than last visit despite gabapentin. In talking to her features are still fully consistent with irritable larynx syndrome. She does not have worsening dyspnea when she swims or she walks  She is frustrated by symptoms particularly because she is significant nasal drainage and bring up sputum. She does not  believe the interstitial lung diseases any worse. She feels albuterol does help her cough    OV 04/29/2016  Chief Complaint  Patient presents with  . Follow-up    Review CT and PFT.  Pt doing well, cough has improved since last ov.     FU ILD due to HP   Feels well. Says cough is not a problem. Says she is entereing best season for her. Last 2 winter cycle 2015-2016 and 2016-2017 bothered by horrible cough with irrtiable laynrhx component after acute sinus/bronchitis. PFT and CT on her daily 53m per day pred has improved and back to 2015 baseline levels. Disease state some worse compared to early 2014 when she was on higher pred dose but similar to 2015 when she has been on low dose pred. She prefers to stay on low dose pred.    OV 10/23/2016  Chief Complaint  Patient presents with  . Follow-up    Pt states she was doing well until she caught a cold a  few weeks ago. Pt states she is improved now but not back to baseline. Pt states her SOB is still a little worse than baseline. Pt states her cough has improved and is back to baseline.    Follow-up interstitial lung disease secondary to hypersensitivity pneumonitis with UIP pattern  Rhonda [name] reports continued stability. In the last 3 years she seems to cycle and respiratory infections with horrible cough that is partially associated with irritable larynx syndrome in the winter months and then she has great months in the spring and summer. Around Thanksgiving 2017 a sometime before that she had another respiratory infection and is recovering from a cough. She treated this with over-the-counter medications. Otherwise continues to be hoarse but is much improved. She feels chewing gum has improved the interim significantly. She says that dyspnea stable but when we walked her 185 feet 3 laps on room air: She dropped to 87% just one of her worse performances but she feels this is because of the recent respiratory infection and subjectively  she feels stable. She is up-to-date with flu shot. She is planning a [place] in [place]. Her daughter Cristal Generous is a student there   [place] 01/25/2017  Chief Complaint  Patient presents with  . Follow-up    [name], she is doing well on her inhaler     FU ILD with UIP pattern   TANAIRY PAYEUR reports continuing to do well. She has completed her trip to [place], [place] and [place]. She did get bronchitis in one of the legs but overall feels well now and is better than baseline in terms of effort tolerance and cough. IN fact compared to a few years ago overall feels better She decline ofv v placebo trial at [facility] due to GI side effect concern (she has easy gi intolerance with some foods)). Says that current dyspnea for stairs and inclines does make her drop her pulse ox to 87% but is no different than her baseline. Walking desat dropped pulse ox to 89% at 3d lap. FVC shows mild decline over past fe year trend. She does not have portable o2 with her   has a past medical history of Allergy; Arthritis; Asthma; Cataract; Depression; DOE (dyspnea on exertion); GERD (gastroesophageal reflux disease); H/O steroid therapy; Heart palpitations (02/28/2015); Helicobacter pylori ab+; Hiatal hernia; High cholesterol; History of chronic bronchitis; Hyperplastic colon polyp (2007); Inguinal hernia; Insulin resistance; Interstitial lung disease ([facility]); MVP (mitral valve prolapse); Pneumonitis, hypersensitivity ([place]); Pulmonary fibrosis ([place]); Pulmonary HTN; and Rapid heart rate.   reports that she quit smoking about 46 years ago. She started smoking about 47 years ago. She has never used smokeless tobacco.  Past Surgical History:  Procedure Laterality Date  . BREAST BIOPSY Right 2009   BIOPSY  . BREAST BIOPSY Left 2003   Benign   . CATARACT EXTRACTION Left   . CESAREAN SECTION    . FOOT FRACTURE SURGERY  2006 or 2007   right  . HYMENECTOMY    . LUNG BIOPSY  09/28/2012    Procedure: LUNG BIOPSY;  Surgeon: Melrose Nakayama, MD;  Location: [facility];  Service: Thoracic;  Laterality: N/A;  lung biopsies tims three  . TOTAL HIP ARTHROPLASTY Right 09/10/2015   Procedure: RIGHT TOTAL HIP ARTHROPLASTY ANTERIOR APPROACH;  Surgeon: Paralee Cancel, MD;  Location: [facility];  Service: Orthopedics;  Laterality: Right;  . TUBAL LIGATION    . VIDEO ASSISTED THORACOSCOPY  09/28/2012   Procedure: VIDEO  ASSISTED THORACOSCOPY;  Surgeon: Melrose Nakayama, MD;  Location: [facility];  Service: Thoracic;  Laterality: Right;  Marland Kitchen VIDEO BRONCHOSCOPY  11/19/2011   Procedure: VIDEO BRONCHOSCOPY WITH FLUORO;  Surgeon: Brand Males, MD;  [facility];  Service: Endoscopy;;    Allergies  Allergen Reactions  . Atorvastatin Other (See Comments)    Leg pain  . Betadine [Povidone Iodine] Other (See Comments)    blisters  . Codeine Nausea And Vomiting  . Rosuvastatin Other (See Comments)    Leg pain  . Shellfish Allergy Other (See Comments)    vomiting  . Sulfonamide Derivatives Other (See Comments)    headaches    Immunization History  Administered Date(s) Administered  . Hepatitis A 05/23/2010  . Hepatitis B 06/23/2006  . Influenza Split 08/11/2012  . Influenza Whole 09/08/2011  . Influenza, High Dose Seasonal PF 08/24/2016  . Influenza,inj,Quad PF,36+ Mos 09/07/2013  . Influenza-Unspecified 09/26/2014, 07/27/2015  . MMR 05/23/2010  . Pneumococcal Conjugate-13 03/15/2014  . Pneumococcal Polysaccharide-23 03/15/2014  . Td 02/22/2003  . Tdap 10/28/2012  . Zoster 12/04/2010    Family History  Problem Relation Age of Onset  . Emphysema Maternal Grandmother   . Asthma Maternal Grandmother   . Asthma Mother   . Osteoarthritis Mother   . Dementia Mother 51  . Lymphoma Father   . Diabetes Father   . Hypertension Sister   . Heart disease Sister   . Kidney disease Sister   . Lung disease Maternal Grandfather   . Bone cancer Paternal Grandfather   . Allergic  rhinitis Daughter   . Breast cancer Paternal Aunt   . Ovarian cancer Maternal Aunt   . Breast cancer Cousin   . Colon cancer Neg Hx   . Angioedema Neg Hx   . Eczema Neg Hx   . Immunodeficiency Neg Hx   . Urticaria Neg Hx      Current Outpatient Prescriptions:  .  acetaminophen (TYLENOL) 325 MG tablet, Take 650 mg by mouth every 6 (six) hours as needed. Reported on 02/10/2016, Disp: , Rfl:  .  albuterol (PROAIR HFA) 108 (90 Base) MCG/ACT inhaler, Inhale 2 puffs into the lungs every 4 (four) hours as needed for wheezing. Or coughing spells.  You may use 2 Puffs 5-10 minutes before exercise., Disp: 1 Inhaler, Rfl: 3 .  Alpha-D-Galactosidase (BEANO PO), Take 1 tablet by mouth as needed (if eating onion or garlic). , Disp: , Rfl:  .  Ascorbic Acid (VITAMIN C) 1000 MG tablet, Take 1,000 mg by mouth daily., Disp: , Rfl:  .  Calcium Carb-Cholecalciferol (CALCIUM 1000 + D PO), Take 1 tablet by mouth daily., Disp: , Rfl:  .  chlorpheniramine (CHLOR-TRIMETON) 4 MG tablet, Take 4 mg by mouth daily as needed for allergies. , Disp: , Rfl:  .  dicyclomine (BENTYL) 10 MG capsule, Take 10 mg by mouth 3 (three) times daily as needed for spasms (stomach cramping). Reported on 01/06/2016, Disp: , Rfl:  .  EPINEPHrine (EPIPEN 2-PAK) 0.3 mg/0.3 mL IJ SOAJ injection, Inject 0.3 mLs (0.3 mg total) into the muscle once., Disp: 2 Device, Rfl: 2 .  fluticasone (FLONASE) 50 MCG/ACT nasal spray, PLACE 2 SPRAYS INTO BOTH NOSTRILS DAILY., Disp: 16 g, Rfl: 0 .  fluticasone furoate-vilanterol (BREO ELLIPTA) 100-25 MCG/INH AEPB, Inhale 1 puff into the lungs daily., Disp: 180 each, Rfl: 1 .  gabapentin (NEURONTIN) 300 MG capsule, TAKE 1 CAPSULE (300 MG TOTAL) BY MOUTH 3 (THREE) TIMES DAILY. TAKE AS DIRECTED, Disp: 90  capsule, Rfl: 5 .  lactase (LACTAID) 3000 UNITS tablet, Take 3,000 Units by mouth as needed (when eating ice cream or dariy). , Disp: , Rfl:  .  metoprolol succinate (TOPROL-XL) 50 MG 24 hr tablet, TAKE 1 TABLET BY  MOUTH EVERY DAY IMMEDIATELY FOLLOWING A MEAL., Disp: 30 tablet, Rfl: 0 .  montelukast (SINGULAIR) 10 MG tablet, Take 1 tablet (10 mg total) by mouth daily., Disp: 30 tablet, Rfl: 0 .  Multiple Vitamin (MULTIVITAMIN) tablet, Take 1 tablet by mouth daily., Disp: , Rfl:  .  Naproxen Sodium (ALEVE) 220 MG CAPS, Take 1-2 capsules by mouth as needed., Disp: , Rfl:  .  omeprazole (PRILOSEC) 20 MG capsule, Take 1 capsule (20 mg total) by mouth daily., Disp: 30 capsule, Rfl: 5 .  pravastatin (PRAVACHOL) 40 MG tablet, Take 0.5 tablets (20 mg total) by mouth daily. Please come in for a visit, Disp: 15 tablet, Rfl: 0 .  predniSONE (DELTASONE) 5 MG tablet, TAKE 1 TABLET BY MOUTH DAILY WITH BREAKFAST., Disp: 90 tablet, Rfl: 2 .  Probiotic Product (PROBIOTIC ADVANCED PO), Take 1 capsule by mouth daily., Disp: , Rfl:  .  Turmeric 500 MG CAPS, Take 1 capsule by mouth daily., Disp: , Rfl:    Review of Systems     Objective:   Physical Exam  Constitutional: She is oriented to person, place, and time. She appears well-developed and well-nourished. No distress.  HENT:  Head: Normocephalic and atraumatic.  Right Ear: External ear normal.  Left Ear: External ear normal.  Mouth/Throat: Oropharynx is clear and moist. No oropharyngeal exudate.  Eyes: Conjunctivae and EOM are normal. Pupils are equal, round, and reactive to light. Right eye exhibits no discharge. Left eye exhibits no discharge. No scleral icterus.  Neck: Normal range of motion. Neck supple. No JVD present. No tracheal deviation present. No thyromegaly present.  Cardiovascular: Normal rate, regular rhythm, normal heart sounds and intact distal pulses.  Exam reveals no gallop and no friction rub.   No murmur heard. Pulmonary/Chest: Effort normal. No respiratory distress. She has no wheezes. She has rales. She exhibits no tenderness.  Fain crackles at base  Abdominal: Soft. Bowel sounds are normal. She exhibits no distension and no mass. There is no  tenderness. There is no rebound and no guarding.  Musculoskeletal: Normal range of motion. She exhibits no edema or tenderness.  Lymphadenopathy:    She has no cervical adenopathy.  Neurological: She is alert and oriented to person, place, and time. She has normal reflexes. No cranial nerve deficit. She exhibits normal muscle tone. Coordination normal.  Skin: Skin is warm and dry. No rash noted. She is not diaphoretic. No erythema. No pallor.  Psychiatric: She has a normal mood and affect. Her behavior is normal. Judgment and thought content normal.  Vitals reviewed.  Vitals:   01/25/17 1058  BP: 106/64  Pulse: 71  SpO2: 96%  Weight: 150 lb (68 kg)  Height: 5' (1.524 m)    Estimated body mass index is 29.29 kg/m as calculated from the following:   Height as of this encounter: 5' (1.524 m).   Weight as of this encounter: 150 lb (68 kg).      Assessment:       ICD-9-CM ICD-10-CM   1. ILD (interstitial lung disease) ([place]) 515 J84.9        Plan:     ILD (interstitial lung disease) Results for Arca, Reagen A (MRN [id number]) as of 01/25/2017 11:31  Ref. Range 03/15/2014 15:56 09/18/2014  11:41 02/26/2015 09:02 12/16/2015 16:31 04/29/2016 12:44 01/25/2017 10:16  FVC-Pre Latest Units: L 1.91 1.73 1.84 1.42  1.69  FVC-%Pred-Pre Latest Units: % 71 64 72 56 67 65    Trend line on breathing tests possible slow decline over time as above  With radiology: Findings in summer 2017 appear similar to 02/22/2015 but are progressive from 08/16/2012  The good thing is you feel well and actually improved from few years ago  PLAN - increase prednisone from 12m to 125mper day - kee an eye on exertional pulse ox at home; if repated dipps to < 88% then call and we can set you with portable o2 esp for trips to mountains - we discussed ofev/esbriet either via trials or [place] dx to IPF: she is reluctant to go that route due to GI side effect   Followup In 3 months do : Pre-bd spiro  and dlco only. No lung volume or bd response.  Return to see in 3 months: depending on course can consider HRCT   (> 50% of this 15 min visit spent in face to face counseling or/and coordination of care)    Dr. MuBrand MalesM.D., F.[facility].P Pulmonary and Critical Care Medicine Staff Physician CoStoyulmonary and Critical Care Pager: [phone] no answer or between  15:00h - 7:00h: call [phone]  01/25/2017 11:34 AM

## 2017-01-28 ENCOUNTER — Other Ambulatory Visit: Payer: Self-pay | Admitting: Family Medicine

## 2017-01-28 ENCOUNTER — Other Ambulatory Visit: Payer: Self-pay | Admitting: Emergency Medicine

## 2017-02-02 ENCOUNTER — Other Ambulatory Visit: Payer: Self-pay | Admitting: Allergy

## 2017-02-02 ENCOUNTER — Other Ambulatory Visit: Payer: Self-pay | Admitting: Emergency Medicine

## 2017-02-02 ENCOUNTER — Other Ambulatory Visit: Payer: Self-pay | Admitting: Family Medicine

## 2017-02-04 DIAGNOSIS — R7309 Other abnormal glucose: Secondary | ICD-10-CM | POA: Diagnosis not present

## 2017-02-04 DIAGNOSIS — Z0000 Encounter for general adult medical examination without abnormal findings: Secondary | ICD-10-CM | POA: Diagnosis not present

## 2017-02-04 DIAGNOSIS — E785 Hyperlipidemia, unspecified: Secondary | ICD-10-CM | POA: Diagnosis not present

## 2017-02-04 DIAGNOSIS — Z049 Encounter for examination and observation for unspecified reason: Secondary | ICD-10-CM | POA: Diagnosis not present

## 2017-02-04 DIAGNOSIS — R635 Abnormal weight gain: Secondary | ICD-10-CM | POA: Diagnosis not present

## 2017-02-04 DIAGNOSIS — R5381 Other malaise: Secondary | ICD-10-CM | POA: Diagnosis not present

## 2017-02-05 ENCOUNTER — Other Ambulatory Visit: Payer: Self-pay | Admitting: Family Medicine

## 2017-02-11 ENCOUNTER — Telehealth: Payer: Self-pay | Admitting: Internal Medicine

## 2017-02-11 DIAGNOSIS — J849 Interstitial pulmonary disease, unspecified: Secondary | ICD-10-CM

## 2017-02-11 NOTE — Telephone Encounter (Signed)
[name]  Can you call Rhonda [name] 02/11/2017 and find out what she wants aobut  A trip abroad? Please apologize on my behalf. She called me 2d ago but I have been swamped  Thanks  Please call 02/11/2017  Dr. Brand Males, M.D., F.C.C.P Pulmonary and Critical Care Medicine Staff Physician [place] [facility] Pager: [phone], If no answer or between  15:00h - 7:00h: call [phone]  02/11/2017 9:04 AM   g

## 2017-02-11 NOTE — Telephone Encounter (Signed)
Called spoke with patient who has 2 questions:  1- patient stated that MR had suggested to her in the past about needing O2 at bedtime.  She discussed this with her PCP who then suggested she have an ONO.  Pt is okay with proceeding with this test if MR is okay with it.  Please advise, thank you.  2- patient will be traveling in May to [place] on a [employer].  MR told her that she needs O2 when traveling/higher elevations and wonders if a portable oxygen concentrator would be appropriate.  If so, she would like an order for this.  Pt said no rush MR please advise, thank you.

## 2017-02-12 NOTE — Telephone Encounter (Signed)
Spoke with pt and informed her of MR recc. The order was placed for the ONO and the email for Marlane Mingle was given. Pt had no further questions.    Spoke with Josph Macho and he states the POC he has is called the [employer] it weighs about 3lbs, it is limited to 3L and the battery life is about 2 hours. This has a clip that can clip on the belt and their is a carrying case. He said the next step up is the OxyGo that can go up to 5-6L.

## 2017-02-12 NOTE — Telephone Encounter (Signed)
Steen do ONO  2. For POC on [place]- she will need something light. Have her contact Marlane Mingle of the [employer]:  [email or web address]  - he know of something that is super light that can strap to the waist. In fact, I think we should call him and get the info on that .   Thanks  Dr. Brand Males, M.D., [facility].C.P Pulmonary and Critical Care Medicine Staff Physician [place] [facility] Pager: [phone], If no answer or between  15:00h - 7:00h: call [phone]  02/12/2017 8:27 AM

## 2017-02-17 ENCOUNTER — Encounter (HOSPITAL_COMMUNITY): Payer: Self-pay

## 2017-02-20 NOTE — Telephone Encounter (Signed)
Rhonda [name]  1. She has not heard from Marlane Mingle yet so I emailed him with cc to her about oxygo fit  2. Also says ONO not done yet. Never heard from anyone about it. Please set up on Room Air or find out status of test  Dr. Brand Males, M.D., [facility].C.P Pulmonary and Critical Care Medicine Staff Physician [place] [facility] Pager: [phone], If no answer or between  15:00h - 7:00h: call [phone]  02/20/2017 4:37 PM

## 2017-02-22 NOTE — Telephone Encounter (Signed)
lmtcb for pt.  

## 2017-02-23 NOTE — Telephone Encounter (Signed)
Patient returned phone call.Rhonda [name] ° °

## 2017-02-23 NOTE — Telephone Encounter (Signed)
lmtcb for pt.  

## 2017-02-23 NOTE — Telephone Encounter (Signed)
Spoke with pt. Informed her of the recs per MR. Called and spoke to [place] at [place] and was informed they were unaware of the ONO order. They have access to Epic and was able to pull the order and will contact pt to scheduled ONO on RA. Pt aware she will be contacted soon and to call us back if he is not.   MR - pt states she has still not been contact by Marlane Mingle, just FYI.

## 2017-02-23 NOTE — Telephone Encounter (Signed)
Patient returned phoned call. Patient contact # [phone].Rhonda [name]

## 2017-03-01 NOTE — Telephone Encounter (Signed)
Thanks, please ask her rto check he email yahoo; fred emaile her 4 days ago with cc to me

## 2017-03-03 NOTE — Telephone Encounter (Signed)
Called and spoke to pt. Pt states she did get an email from Marlane Mingle but states she hasnt called him yet. She also states she has not received a call about an ONO from [place]. I advised her to call [facility] to see if they have been trying to reach her to set it up. [facility]'s number given to pt to call. Pt verbalized understanding and denied any further questions or concerns at this time.

## 2017-03-04 DIAGNOSIS — H35372 Puckering of macula, left eye: Secondary | ICD-10-CM | POA: Diagnosis not present

## 2017-03-04 DIAGNOSIS — H43821 Vitreomacular adhesion, right eye: Secondary | ICD-10-CM | POA: Diagnosis not present

## 2017-03-05 ENCOUNTER — Telehealth: Payer: Self-pay | Admitting: Internal Medicine

## 2017-03-05 NOTE — Telephone Encounter (Signed)
This ONO order was sent to [place] on 03/23//2018 by me and I have resent the same order again today

## 2017-03-09 ENCOUNTER — Telehealth: Payer: Self-pay | Admitting: Internal Medicine

## 2017-03-09 NOTE — Telephone Encounter (Signed)
Per MR -  Pt will need to come in for O2 qualification.   LMTCB for pt to schedule appt.

## 2017-03-10 NOTE — Telephone Encounter (Signed)
Pt aware of scheduled apt per Danae Chen. Nothing further needed.

## 2017-03-10 NOTE — Telephone Encounter (Signed)
Patient scheduled 4/20 @ 0900...ert

## 2017-03-11 DIAGNOSIS — K219 Gastro-esophageal reflux disease without esophagitis: Secondary | ICD-10-CM | POA: Diagnosis not present

## 2017-03-12 ENCOUNTER — Ambulatory Visit (INDEPENDENT_AMBULATORY_CARE_PROVIDER_SITE_OTHER): Payer: Medicare Other | Admitting: Internal Medicine

## 2017-03-12 DIAGNOSIS — R0602 Shortness of breath: Secondary | ICD-10-CM

## 2017-03-16 ENCOUNTER — Telehealth: Payer: Self-pay | Admitting: Internal Medicine

## 2017-03-16 DIAGNOSIS — J849 Interstitial pulmonary disease, unspecified: Secondary | ICD-10-CM

## 2017-03-16 NOTE — Progress Notes (Addendum)
Walk test. Patient desaturated to 88%. Initially typed as 89% which was a typo. D.w [name] RN - she will send this to [place]. Start her on  2L portable o2 during day with exertion.     Dr. Brand Males, M.D., [facility].C.P Pulmonary and Critical Care Medicine Staff Physician [place] [facility] Pager: [phone], If no answer or between  15:00h - 7:00h: call [phone]  03/16/2017 1:46 PM

## 2017-03-16 NOTE — Telephone Encounter (Signed)
Documentation for walk test on 03/12/17 staff MD note: desaturated to 88% at 3rd lap of 185 feet x 3 laps on room air. Corrected to pulse ox > 92% walking same distance on 2L [place]   Dr. Brand Males, M.D., [facility].C.P Pulmonary and Critical Care Medicine Staff Physician [place] [facility] Pager: [phone], If no answer or between  15:00h - 7:00h: call [phone]  03/16/2017 2:00 PM

## 2017-03-17 ENCOUNTER — Telehealth: Payer: Self-pay | Admitting: Internal Medicine

## 2017-03-17 DIAGNOSIS — J849 Interstitial pulmonary disease, unspecified: Secondary | ICD-10-CM

## 2017-03-17 NOTE — Telephone Encounter (Signed)
Called and spoke to [place] with [employer]. Estill Bamberg states pt's OV on 01/25/2017 is too old (past 30 days from [place] on 4/20) and will need another face to face. Estill Bamberg also states pt is requesting an oxygo system, a new order placed to reflect pt's POC request. Appt made with SG on 03/18/17. Called pt and informed her of the criteria to get her O2, pt verbalized understanding and denied any further questions or concerns at this time.

## 2017-03-17 NOTE — Telephone Encounter (Signed)
Order placed. Please see other phone note from 03/17/17. Will sign off.

## 2017-03-17 NOTE — Addendum Note (Signed)
Addended by: Collier Salina on: 03/17/2017 11:09 AM   Modules accepted: Orders

## 2017-03-18 ENCOUNTER — Telehealth: Payer: Self-pay | Admitting: Acute Care

## 2017-03-18 ENCOUNTER — Encounter: Payer: Self-pay | Admitting: Pediatrics

## 2017-03-18 ENCOUNTER — Encounter: Payer: Self-pay | Admitting: Acute Care

## 2017-03-18 ENCOUNTER — Ambulatory Visit (INDEPENDENT_AMBULATORY_CARE_PROVIDER_SITE_OTHER): Payer: Medicare Other | Admitting: Acute Care

## 2017-03-18 ENCOUNTER — Telehealth: Payer: Self-pay | Admitting: Pediatrics

## 2017-03-18 DIAGNOSIS — J849 Interstitial pulmonary disease, unspecified: Secondary | ICD-10-CM | POA: Diagnosis not present

## 2017-03-18 DIAGNOSIS — J9621 Acute and chronic respiratory failure with hypoxia: Secondary | ICD-10-CM | POA: Diagnosis present

## 2017-03-18 DIAGNOSIS — R0902 Hypoxemia: Secondary | ICD-10-CM | POA: Diagnosis not present

## 2017-03-18 NOTE — Telephone Encounter (Signed)
Spoke with pt. Advised her that I can not remove this from her record. Pt states she just had a few cigarettes in college. I told her that if she was to be approached, she can just explain the situation.

## 2017-03-18 NOTE — Assessment & Plan Note (Signed)
Continue prednisone daily Maintain oxygen saturations>92% We have ordered oxygen at 2 L with activity We will have patient evaluated for portable oxygen system PFT's 04/2017 as scheduled Consider repeat HRCT at follow up depending on patients status.

## 2017-03-18 NOTE — Patient Instructions (Addendum)
It is good to see you today. We will fax the office visit note to [place] confirming you need your oxygen at 2 L with activity We will re-order the oxygen to affirm need. Continue your medications as you have been doing. Follow up with Dr. Chase Caller 04/27/17 as is already scheduled. Please contact office for sooner follow up if symptoms do not improve or worsen or seek emergency care

## 2017-03-18 NOTE — Progress Notes (Signed)
History of Present Illness Rhonda [name] is a 66 y.o. female with Hypersensitivity pneumonitis and ILD. She is followed by Dr. Chase Caller.   03/18/2017 Follow Up Appointment: Pt. Presents today for required  Visit per [facility] for ordered oxygen.  6 minute walk done 03/12/2017 indicated drop in oxygen saturations to 88%. ( See results below). She also monitors her saturations at home, and they are routinely dropping to 85% with walking. She requires oxygen at 2 L with walking and exercise based on the walk test results. They qualify the patient for oxygen use with exertion.She is compliant with her Breo, Singulair, Flonase and prednisone. She is taking Carafate for some GI sensitivities.She is also using chlortrimeton once daily.She denies fever, chest pain, orthopnea or hemoptysis.  Test Results: Documentation for walk test on 03/12/17 staff MD note: desaturated to 88% at 3rd lap of 185 feet x 3 laps on room air. Corrected to pulse ox > 92% walking same distance on 2L [place]  CBC Latest Ref Rng & Units 09/12/2015 09/11/2015 09/03/2015  WBC 4.0 - 10.5 K/uL 10.7(H) 9.8 13.9(H)  Hemoglobin 12.0 - 15.0 g/dL 11.5(L) 11.7(L) 14.8  Hematocrit 36.0 - 46.0 % 35.5(L) 36.7 45.8  Platelets 150 - 400 K/uL 170 169 207    BMP Latest Ref Rng & Units 09/12/2015 09/11/2015 09/03/2015  Glucose 65 - 99 mg/dL 118(H) 128(H) 105(H)  BUN 6 - 20 mg/dL 6 6 26(H)  Creatinine 0.44 - 1.00 mg/dL 0.69 0.80 0.93  Sodium 135 - 145 mmol/L 137 137 139  Potassium 3.5 - 5.1 mmol/L 3.9 4.0 4.5  Chloride 101 - 111 mmol/L 103 103 104  CO2 22 - 32 mmol/L 29 30 29   Calcium 8.9 - 10.3 mg/dL 8.1(L) 8.1(L) 9.6    BNP No results found for: BNP  ProBNP No results found for: PROBNP  PFT    Component Value Date/Time   FEV1PRE 1.53 01/25/2017 1016   FEV1POST 1.61 04/29/2016 1244   FVCPRE 1.69 01/25/2017 1016   FVCPOST 1.78 04/29/2016 1244   TLC 2.98 04/29/2016 1244   DLCOUNC 10.43 01/25/2017 1016   PREFEV1FVCRT 90  01/25/2017 1016   PSTFEV1FVCRT 90 04/29/2016 1244    No results found.   Past medical hx Past Medical History:  Diagnosis Date  . Allergy   . Arthritis    history spinal stenosis. osteoarthritis right hip  . Asthma   . Cataract   . Depression   . DOE (dyspnea on exertion)    a. 04/2010 Lexi MV EF 71%, no ischemia/infarct;    . GERD (gastroesophageal reflux disease)   . H/O steroid therapy    Steroid use orally over 4 yrs- for Lung Fibrosis  . Heart palpitations 02/28/2015  . Helicobacter pylori ab+   . Hiatal hernia   . High cholesterol   . History of chronic bronchitis    as child  . Hyperplastic colon polyp 2007  . Inguinal hernia    right  . Insulin resistance    past  . Interstitial lung disease ([place])   . MVP (mitral valve prolapse)    Posterior mitral valve leaflet with mild MR  . Pneumonitis, hypersensitivity ([place])    a. 09/2012 s/p Bx - ? 2/2 bird, mold, oil paint exposure ->on steroids, followed by pulm.  . Pulmonary fibrosis ([facility])    Dr. Chase Caller follows- stable at present  . Pulmonary HTN ([place])    mild PASP 9mmHg on echo 02/2015  . Rapid heart rate    Dr. Radford Pax  follows- last visit Epic note 9'16     Social History  Substance Use Topics  . Smoking status: Former Smoker    Start date: 12/05/1969    Quit date: 12/05/1970  . Smokeless tobacco: Never Used     Comment: pt states she experimented in college  . Alcohol use 1.2 oz/week    1 Glasses of wine, 1 Standard drinks or equivalent per week     Comment: once a wk. 1-2 glasses    Tobacco Cessation: Never smoker per the patient.  Past surgical hx, Family hx, Social hx all reviewed.  Current Outpatient Prescriptions on File Prior to Visit  Medication Sig  . acetaminophen (TYLENOL) 325 MG tablet Take 650 mg by mouth every 6 (six) hours as needed. Reported on 02/10/2016  . albuterol (PROAIR HFA) 108 (90 Base) MCG/ACT inhaler Inhale 2 puffs into the lungs every 4 (four) hours as needed for wheezing. Or  coughing spells.  You may use 2 Puffs 5-10 minutes before exercise.  . Alpha-D-Galactosidase (BEANO PO) Take 1 tablet by mouth as needed (if eating onion or garlic).   . Ascorbic Acid (VITAMIN C) 1000 MG tablet Take 1,000 mg by mouth daily.  . Calcium Carb-Cholecalciferol (CALCIUM 1000 + D PO) Take 1 tablet by mouth daily.  . chlorpheniramine (CHLOR-TRIMETON) 4 MG tablet Take 4 mg by mouth daily as needed for allergies.   Marland Kitchen dicyclomine (BENTYL) 10 MG capsule Take 10 mg by mouth 3 (three) times daily as needed for spasms (stomach cramping). Reported on 01/06/2016  . EPINEPHrine (EPIPEN 2-PAK) 0.3 mg/0.3 mL IJ SOAJ injection Inject 0.3 mLs (0.3 mg total) into the muscle once.  . fluticasone (FLONASE) 50 MCG/ACT nasal spray PLACE 2 SPRAYS INTO BOTH NOSTRILS DAILY.  . fluticasone furoate-vilanterol (BREO ELLIPTA) 100-25 MCG/INH AEPB Inhale 1 puff into the lungs daily.  Marland Kitchen gabapentin (NEURONTIN) 300 MG capsule TAKE 1 CAPSULE (300 MG TOTAL) BY MOUTH 3 (THREE) TIMES DAILY. TAKE AS DIRECTED  . lactase (LACTAID) 3000 UNITS tablet Take 3,000 Units by mouth as needed (when eating ice cream or dariy).   . metoprolol succinate (TOPROL-XL) 50 MG 24 hr tablet TAKE 1 TABLET BY MOUTH EVERY DAY IMMEDIATELY FOLLOWING A MEAL.  . montelukast (SINGULAIR) 10 MG tablet Take 1 tablet (10 mg total) by mouth daily.  . Multiple Vitamin (MULTIVITAMIN) tablet Take 1 tablet by mouth daily.  . Naproxen Sodium (ALEVE) 220 MG CAPS Take 1-2 capsules by mouth as needed.  Marland Kitchen omeprazole (PRILOSEC) 20 MG capsule Take 1 capsule (20 mg total) by mouth daily.  . pravastatin (PRAVACHOL) 40 MG tablet Take 0.5 tablets (20 mg total) by mouth daily. Please come in for a visit  . predniSONE (DELTASONE) 10 MG tablet Take 1 tablet (10 mg total) by mouth daily with breakfast.  . Probiotic Product (PROBIOTIC ADVANCED PO) Take 1 capsule by mouth daily.  . Turmeric 500 MG CAPS Take 1 capsule by mouth daily.  . predniSONE (DELTASONE) 5 MG tablet TAKE 1  TABLET BY MOUTH DAILY WITH BREAKFAST. (Patient not taking: Reported on 03/18/2017)   No current facility-administered medications on file prior to visit.      Allergies  Allergen Reactions  . Atorvastatin Other (See Comments)    Leg pain  . Betadine [Povidone Iodine] Other (See Comments)    blisters  . Codeine Nausea And Vomiting  . Rosuvastatin Other (See Comments)    Leg pain  . Shellfish Allergy Other (See Comments)    vomiting  . Sulfonamide Derivatives  Other (See Comments)    headaches    Review Of Systems:  Constitutional:   No  weight loss, night sweats,  Fevers, chills, fatigue, or  lassitude.  HEENT:   No headaches,  Difficulty swallowing,  Tooth/dental problems, or  Sore throat,                No sneezing, itching, ear ache, nasal congestion,+ post nasal drip,   CV:  No chest pain,  Orthopnea, PND, swelling in lower extremities, anasarca, dizziness, palpitations, syncope.   GI  No heartburn, indigestion, abdominal pain, nausea, vomiting, diarrhea, change in bowel habits, loss of appetite, bloody stools.   Resp: No shortness of breath with exertion or at rest.  No excess mucus, + mild productive cough,  No non-productive cough,  No coughing up of blood.  No change in color of mucus.  No wheezing.  No chest wall deformity  Skin: no rash or lesions.  GU: no dysuria, change in color of urine, no urgency or frequency.  No flank pain, no hematuria   MS:  No joint pain or swelling.  No decreased range of motion.  No back pain.  Psych:  No change in mood or affect. No depression or anxiety.  No memory loss.   Vital Signs BP 130/72 (BP Location: Right Arm, Patient Position: Sitting, Cuff Size: Normal)   Pulse 70   Ht 5' (1.524 m)   Wt 153 lb (69.4 kg)   SpO2 95%   BMI 29.88 kg/m    Physical Exam:  General- No distress,  A&Ox 3, pleasant   ENT: No sinus tenderness, TM clear, pale nasal mucosa, no oral exudate,no post nasal drip, no LAN Cardiac: S1, S2, regular  rate and rhythm, no murmur Chest: No wheeze/ basilar crackles noted/ dullness; no accessory muscle use, no nasal flaring, no sternal retractions Abd.: Soft Non-tender, non-distended Ext: No clubbing cyanosis, edema Neuro:  normal strength Skin: No rashes, warm and dry Psych: normal mood and behavior   Assessment/Plan  Mild persistent asthma Continue Breo once daily Rinse mouth after use Follow up with MR 04/27/17 Please contact office for sooner follow up if symptoms do not improve or worsen or seek emergency care   ILD (interstitial lung disease) Continue prednisone daily Maintain oxygen saturations>92% We have ordered oxygen at 2 L with activity We will have patient evaluated for portable oxygen system PFT's 04/2017 as scheduled Consider repeat HRCT at follow up depending on patients status.  Hypoxemia Chronic hypoxemia 2/2 ILD and hypersensitivity pneumnitis Documentation for walk test on 03/12/17 staff MD note: desaturated to 88% at 3rd lap of 185 feet x 3 laps on room air. Corrected to pulse ox > 92% walking same distance on 2L [place] Plan Oxygen at 2 L [place] with activity Goal oxygen saturations are > 92%     Magdalen Spatz, NP 03/18/2017  11:19 AM

## 2017-03-18 NOTE — Telephone Encounter (Signed)
Patient called and said when she came in to see [place] a while back, it was put down in her chart that she is a smoker. She said that is incorrect and would like that removed from her chart. She said she has pulmonary issues and this always comes up when she visits doctor's and she would just like it to be removed.

## 2017-03-18 NOTE — Assessment & Plan Note (Signed)
>>  ASSESSMENT AND PLAN FOR HYPOXEMIA WRITTEN ON 03/18/2017 11:18 AM BY RUTHELL DOMINO F, NP  Chronic hypoxemia 2/2 ILD and hypersensitivity pneumnitis Documentation for walk test on 03/12/17 staff MD note: desaturated to 88% at 3rd lap of 185 feet x 3 laps on room air. Corrected to pulse ox > 92% walking same distance on 2L [place] Plan Oxygen  at 2 L [place] with activity Goal oxygen  saturations are > 92%

## 2017-03-18 NOTE — Assessment & Plan Note (Signed)
Chronic hypoxemia 2/2 ILD and hypersensitivity pneumnitis Documentation for walk test on 03/12/17 staff MD note: desaturated to 88% at 3rd lap of 185 feet x 3 laps on room air. Corrected to pulse ox > 92% walking same distance on 2L [place] Plan Oxygen at 2 L [place] with activity Goal oxygen saturations are > 92%

## 2017-03-18 NOTE — Assessment & Plan Note (Signed)
Continue Breo once daily Rinse mouth after use Follow up with MR 04/27/17 Please contact office for sooner follow up if symptoms do not improve or worsen or seek emergency care

## 2017-03-18 NOTE — Telephone Encounter (Signed)
[facility] was contacted at [phone] to ensure faxed OV note from [facility] was received for oxygen. [place] employee stated they received OV note. Nothing further is needed.

## 2017-03-25 ENCOUNTER — Telehealth: Payer: Self-pay | Admitting: Internal Medicine

## 2017-03-25 NOTE — Telephone Encounter (Signed)
Per MR-  Pt is needing forms completed for her upcoming flight and for her POC.   LMTCB for pt.

## 2017-03-26 ENCOUNTER — Encounter: Payer: Self-pay | Admitting: Emergency Medicine

## 2017-03-26 NOTE — Telephone Encounter (Signed)
If pt calls back please forward to [facility]

## 2017-03-26 NOTE — Telephone Encounter (Signed)
Patient returned call, [phone].

## 2017-03-26 NOTE — Telephone Encounter (Signed)
Called and spoke to pt. Pt is requesting the [employer] and letter be completed and would like to pick this up on Monday 03/29/17. Will keep in my box to follow up on.

## 2017-03-26 NOTE — Telephone Encounter (Signed)
Called and spoke to pt. She states she is using the OxyGoFit POC during her [employer] flight and her [employer] flight. Spoke with MR and we will do the flight form for [employer] and a letter for pt to carry on her POC.   LMTCB for pt to find out the flight number for [employer] and [employer] and the date and time of the flights. Pt is needing these forms today.

## 2017-03-26 NOTE — Telephone Encounter (Signed)
Pt returning call.Rhonda [name] ° °

## 2017-03-26 NOTE — Telephone Encounter (Signed)
lmtcb for pt.  

## 2017-03-29 ENCOUNTER — Encounter: Payer: Self-pay | Admitting: Emergency Medicine

## 2017-03-29 NOTE — Telephone Encounter (Signed)
Letter and [employer] airline form has been given to pt today 03/29/17. Pt verbalized understanding and states she still has not received the POC from [place]. Will call [facility] this afternoon to see why this is taking so long.

## 2017-03-30 NOTE — Telephone Encounter (Signed)
Called and spoke to [place] at [place]. Bethanne Ginger states the POC has been delivered to [facility] and will be delivered to the pt tomorrow 5.9.2018.   LMTCB for pt.

## 2017-03-31 ENCOUNTER — Encounter: Payer: Self-pay | Admitting: Emergency Medicine

## 2017-03-31 ENCOUNTER — Other Ambulatory Visit: Payer: Self-pay | Admitting: Internal Medicine

## 2017-03-31 ENCOUNTER — Telehealth: Payer: Self-pay | Admitting: Internal Medicine

## 2017-03-31 NOTE — Telephone Encounter (Signed)
Patient returned Rhonda [name]'s call.  I let her know that per the note [facility] states POC should be delivered today.  She is calling [employer] to see if they know about what time they will be delivering this.  No return call is needed per patient.

## 2017-03-31 NOTE — Telephone Encounter (Signed)
Will close encounter

## 2017-03-31 NOTE — Telephone Encounter (Signed)
Patient returned phone call, the letter stated from MR the portable concentrator was to stay on at all times, airlines stated two batteries were not enough, so pt requested the letter to be changed; pt now states [facility] will be bringing extra batteries so no changes to the letter is needed at this time.Rhonda [name]

## 2017-03-31 NOTE — Telephone Encounter (Signed)
Called and spoke to pt. Pt states she still needs a letter stating she only needs to use the POC on the flight with exertion. Letter has been changed and placed up front for pick up. Pt verbalized understanding and denied any further questions or concerns at this time.

## 2017-04-01 ENCOUNTER — Telehealth: Payer: Self-pay | Admitting: Internal Medicine

## 2017-04-01 NOTE — Telephone Encounter (Signed)
Letter has been rewritten per pt request, as she did not think airlines would accept the previous letter.  Nothing further needed.

## 2017-04-05 ENCOUNTER — Encounter: Payer: Self-pay | Admitting: Emergency Medicine

## 2017-04-06 ENCOUNTER — Encounter: Payer: Self-pay | Admitting: Internal Medicine

## 2017-04-06 ENCOUNTER — Ambulatory Visit (INDEPENDENT_AMBULATORY_CARE_PROVIDER_SITE_OTHER): Payer: Medicare Other | Admitting: Internal Medicine

## 2017-04-06 VITALS — BP 118/70 | HR 73 | Ht 60.0 in | Wt 150.0 lb

## 2017-04-06 DIAGNOSIS — J849 Interstitial pulmonary disease, unspecified: Secondary | ICD-10-CM | POA: Diagnosis not present

## 2017-04-06 DIAGNOSIS — J387 Other diseases of larynx: Secondary | ICD-10-CM | POA: Diagnosis not present

## 2017-04-06 MED ORDER — EPINEPHRINE 0.3 MG/0.3ML IJ SOAJ: 0.3000 mg | Freq: Once | INTRAMUSCULAR | 2 refills | Status: AC

## 2017-04-06 NOTE — Patient Instructions (Addendum)
ICD-9-CM ICD-10-CM   1. ILD (interstitial lung disease) ([place]) 515 J84.9   2. Irritable larynx syndrome 478.79 J38.7     Discussed disease and outlook : slow progression v stability:   but there is variability with range of outcomes  Will walk you with forehead probe and 2L [place] on your portable system to see if is working  For travel  -: use o2 for exertion and [place] esp if finger pulse ox is < 86% and in flight if needed  - ensure you are movnig yourself well in flight and wear compression stockings to prevent blood clot   -  2 x epi pen prescriptions    I will discuss at ATS meeting if your diagnosis is IPF and see if Esbriet might play a role in reducing risk for progression  Refer lung transplant evaluation @ [facility] - can go there after your [place] trip  - they expect weight loss, rehab, family support and atleast > $15,000 in cash for sundries   Be in touch with me on whatsapp   Followup - in June/july  2018: do Pre-bd spiro and dlco only. No lung volume or bd response. No post-bd spiro - return to see me in June/July 2018 after above test

## 2017-04-06 NOTE — Progress Notes (Signed)
Subjective:     Patient ID: Rhonda [name], female   DOB: 1951/08/23, 66 y.o.   MRN: [id number]  HPI   #GE reflux with small hiatal hernia  - on ppi  #MEtabolic Syndrome  - On low glycemic diet  159#  on 09/19/12 147# on 11/21/12 145# on 01/02/13 145# on 02/28/2013 147# - on 05/25/2013 154# - 09/18/14 147# - June 2017    #History of rapid heart rate not otherwise specified  - Start her on Lopressor  2008/2009 by primary care physician. History appears to correlate with onset of respiratory issues  - refuses to dc this drug as of 2014 discussion   # Hypersensitivity Pneumonitis and ILD  - Potential etiologies - cockateil x 2 for 18 years through end 2012. 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 Denies mold but lives in house built in [place] with a "weird baselment: and has humidifier  - first noted on CT chest 10/15/09 following trip to [place] (PE negative) but not described in 2003 CT chest report  - autoimmune panel: 10/23/11: Negative  -  Uderwennt bronch 11/19/11  - non diagnostic  - VATS Nov 2013 (done after initially refusing)- [place]. However, there is worrying trend of UIP pattern in the Upper lobes.            Oct/Nov 2012  March 2013 08/11/12  Nov/Dec 2013 Dx HP/UIP 01/02/2013  02/28/13 05/25/2013  12/06/2013  03/15/2014  06/29/2014  10'27/15 12/19/2014  02/26/15 01/13/2016  04/29/2016  10/23/2016  01/25/2017  04/06/2017   Symp/Signs Dyspnea x5y  New crackles  dimished dyspnea except at hill. Improved crackles.  Improved dyspnea except @ hill              Fev1  1.9 L   1.7L/90%  1.66 L/87%   1.66 L/78%  1.75L/90% 1.68L/82% 1.56/75 1.6L/78% 1.48/75% 1.58L/77%  1.52L/78% 1.39/72% (jan) 1.61L/80%  1.53L/77%   FVC  2.6 L    2.2 L/82%   2.1 L/80%   2.19 L/82%  2.2L/91% 2.1/81% 1.86/69% 1.9L/71% 1.86/75% 1.75L/65% 1.82/73% (dec 2015 offce spiro) 2.84L/72% 1.54/61%  1.78L/67%  1.69L/65%   Ratio     79   76/95%  80/99% 80/100%  84  90      90   TLC  4.3 L    3.3 L/79%  3.5 L/82%     3.22/72% 3.06L/68%  3.06/68%  3.78/87% 2.86/66% 2/98L/66%     DLCO  10/50%    10/51%   9.6/51%     11.9/63% 11.7/62%  11.561%  5.5/31% 10.06/57% 11/04/63%  10.43/55%   Walk test 185 ft x 3 laps on RA  rest 92%. Pk exertion 90%. Pk HR 108 Rest 92%. Pk exertion 86% at 2.5 laps. Pk HR 102  pk exertion - 88% Rest 96%. Pk 95%. Pk HR is 75. Done at full dose lopressor Rest 94%. Pk 91%. Rest HR 93 with pk HR 103/min. Done at half dose lopressor REst 96%. Pk pulse ox 92%. Rest HR 65. Pk HR 86 Did not desat. Normal Pk HR  Pulse ox 96%, droped to89% at 2nd lap but bounced up and ended at 91% 3rd lap Did not desat Did not desat, Pk HHR 93  ;llowest pulse ox 90% at 2nd lap but she held her breath, then 92% at 3rd lap 99% -> 91% at end of 3rd lap 94%, Dropped to 87% at 3rd lap,  HR peak102/m 97%, dropped to 89% in 3rd lap - asymptomatic. HR 100/min   CT chest   unchanged          yes Ct mid dec possibly worse - unsure Ct - similar to April 2016 but worse since 2013     Tests/Bx Bronch 0- non diagnostic   VATS - HP with UIP in Upper lobe                RX    Rx pred start 11/23/12 On  pred 69m per day and will reduce to 293mOn Pred 2036mer day On pred 36m37mr day On pred 5mg 81m day Rx pred 5mg d57my She reduced to pred 5mg x 68mt 1 month QOD due to GI concern side effects On pred 5mg qod50mt will increase to 5/d after this visit On pred 5mg dail60m On pred 5mg daily45mncrease pred to 36mg per d1m                       OV 12/19/2014  Chief Complaint  Patient presents with  . Follow-up    Pt stated she is much improved since being sick. Pt c/o DOE d/t cold weather, prod cough with white mucus, PND and epigastric pain.     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 Now cough has improved  to near baseline.  Stil bothered by the residual cough which seems to have significant neurogenic component (cleras throat, gag etc., ). Dyspnea on exertion is class 2 and stable compated to last visit with stable spirometry and walking desat test. However, husband feels over time pst few years dyspnea is progressive. She is very afraid of even the mildes incline. Swimming poses problem for dyspnea and cough. Never been to rehab due to daily swimming. Now worried that dyspnea/cough can impact desire to travel abroad. Also, seems to be having non specific msk tupe right chest wall pain for many months.   Past, Family, Social reviewed: no change since last visit   OV 02/26/2015  Chief Complaint  Patient presents with  . Follow-up    Pt here after PFT and HRCT. Pt stated her breathing is better but her allergies are bothersome. Pt c/o prod cough with white mucus, sinus congestion, rhinorrhea. Pt stated her cough is better when she lays down.     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 AT that time big issue was severe cough which was felt largely cough hyperesnitvity due to fall 2015 viral infection. She says that slowly resolved. Wit that dyspnea improved too. [place] better than ever before. However, past week pollen level high and having cough again though not as bad. She is takng nasal steroid, OTC antihistamine x 2 and his helping. Dyspnea itself is stble. Cntinues pred 5mg [facility]50mtt[facility] [place] [facility] [place]. 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 The chronic cough is resolved. [place]. Dyhspnea is mild , on exertion , relieved by rest and stable withoiut change. Continues prednisone 5mg per day.57me is is due for Hip surgery  with Dr Alvan Dame in Oct 2017. She has lot of questions  that she wants me to talk to DR Alvan Dame about. I spoke to him several days after this visit when he called and resolved questions. Overall low risk for pulmonary complications after hip surgery  Spiro FVC 1.8L and walking ddesat test - no desat in office  Suggests stability\   OV 10/11/2015  Chief Complaint  Patient presents with  . Follow-up    Pt c/o increaes in SOB, desaturation with activity. Pt c/o recent cough with yellow mucus in morning and white mucus throughout the day. Pt c/o pain under right breast when twisting or bending over. Pt stated she had a right hip replacement.    follow-up interstitial lung disease due to hypersensitivity pneumonitis on baseline prednisone 5 mg per day  She had hip surgery 09/09/2015 and has had an uneventful postoperative course. She is ambulating slowly and her pain is resolved. 10 days ago started having worsening cough similar to 4 2015 when she had acute bronchitis which degenerated into chronic irritable larynx syndrome and chronic cough. Then some 3 days ago the sputum changed green in color associated with right-sided musculoskeletal chest pain at the site of surgical lung biopsy incision from the past. Of note she has a history of pneumothorax of the right side but a spontaneous but on the same side a surgical lung biopsy. No specific fever or hemoptysis or leg swelling. She's also noticed increased desaturations to 88% at home but here walking desaturation test she did get dyspneic 185 feet 3 laps on room air. She had more dyspnea than usual but she did not desaturate below 92% but did get tachycardic. She does feel better since yesterday  OV 01/13/2016  Chief Complaint  Patient presents with  . Follow-up    Pt c/o continued cough with clear/white mucus and SOB with exertion. Pt also c/o increasing pain under each breast that shoots through to her back.    Follow-up interstitial lung disease due to hypersensitivity pneumonitis on baseline  prednisone 5 mg per day  Since last visit 3 months ago in November 2016 she continues to have severe chronic cough. This really has not gone away. It is completely consuming her quality of life. At last visit in mid November 2016 we did a day prednisone burst along with doxycycline but it did not help. Following this did CT sinus that was normal early December 2016. Did high resolution CT chest and it suggested that the groundglass opacities with slightly prominent and she was possibly worse. Then in mid December 2016 as a result we did a 3 week prednisone burst. This again did not help the cough. Right at the end of this prednisone taper and gender 2017 did a pulmonary function test. The shows decline in her lung function but she does not feel decline in terms of dyspnea and she says that the PFT was hard because of her cough and she attributes the worsening due to cough and performance issues. Overall her dyspnea is unchanged and she is at baseline. When she climbs the stairs she desaturates to 85% at home but she says this is baseline.  Cough is the main thing that is consuming her. This cough is severe. Happens at the end of swimming when she does of breath hold. Also happens when she talks or laughs. Cough does not bother her when she sleeps and she is able to sleep through the night. Associated with significant sputum. This. It was  mostly clear. There is a constant sensation of tickle in the throat or something in the throat. Her voice is hoarse periodically. Cough is also associated with significant right-sided musculoskeletal stabbing pain on the side of the biopsy. She has recently seen allergist  Dr Angelica Pou and she is allergic to many things including dust and mold. She says there is no mold in the house anymore. She is now on Singulair and to antihistamines. But these have not helped. In fact she says that when she stopped taking her antihistamines for allergy testing the cough did not change at all.  She questions the usefulness of these medications and helping her cough. She continues with acid reflux treatment unchanged. RSI cough score is 17 and reflects irritable larynx syndrome. Cough differentiator score performed and detailed below and suggests that she does indeed have irritable larynx syndrome. Exhaled nitric oxide test today done in the office is normal and suggests he does not not have eosinophilic airway inflammation   OV 02/20/2016  Chief Complaint  Patient presents with  . Follow-up    Pt states her cough has improved since last OV. Pt states her cough starts to worsen towards 5pm each day. Pt states she was exposed to car exhaust and states her cough worsened for 1 week but now feels better.    Follow-up chronic cough in the setting of interstitial lung disease/hypersensitivity pneumonitis. On daily prednisone 5 mg  Last visit was approximately 5 weeks ago. At that time she had worsening cough. He just was strongly consistent with cough neuropathy/irritable larynx syndrome. I started on gabapentin at the last visit. In the interim I keep in touch with her. She reported that as of a few weeks ago cough is significantly improved just a mild level. Therefore she decided to skip on speech therapy. She continues with prednisone 5 mg per day. Then approximately 10 days ago she got exposed to diesel fume and then a week ago she came off Singulair due to allergy testing needs with her allergist Dr Angelica Pou. This then resulted in decompensation of the cough and the cough is significantly worse. However the cough is better than 4 or 5 weeks ago. RSI cough score is 15 shows only it is marginally better right now than last visit despite gabapentin. In talking to her features are still fully consistent with irritable larynx syndrome. She does not have worsening dyspnea when she swims or she walks  She is frustrated by symptoms particularly because she is significant nasal drainage and bring up  sputum. She does not believe the interstitial lung diseases any worse. She feels albuterol does help her cough    OV 04/29/2016  Chief Complaint  Patient presents with  . Follow-up    Review CT and PFT.  Pt doing well, cough has improved since last ov.     FU ILD due to HP   Feels well. Says cough is not a problem. Says she is entereing best season for her. Last 2 winter cycle 2015-2016 and 2016-2017 bothered by horrible cough with irrtiable laynrhx component after acute sinus/bronchitis. PFT and CT on her daily 6m per day pred has improved and back to 2015 baseline levels. Disease state some worse compared to early 2014 when she was on higher pred dose but similar to 2015 when she has been on low dose pred. She prefers to stay on low dose pred.    OV 10/23/2016  Chief Complaint  Patient presents with  . Follow-up  Pt states she was doing well until she caught a cold a few weeks ago. Pt states she is improved now but not back to baseline. Pt states her SOB is still a little worse than baseline. Pt states her cough has improved and is back to baseline.    Follow-up interstitial lung disease secondary to hypersensitivity pneumonitis with UIP pattern  Rhonda [name] reports continued stability. In the last 3 years she seems to cycle and respiratory infections with horrible cough that is partially associated with irritable larynx syndrome in the winter months and then she has great months in the spring and summer. Around Thanksgiving 2017 a sometime before that she had another respiratory infection and is recovering from a cough. She treated this with over-the-counter medications. Otherwise continues to be hoarse but is much improved. She feels chewing gum has improved the interim significantly. She says that dyspnea stable but when we walked her 185 feet 3 laps on room air: She dropped to 87% just one of her worse performances but she feels this is because of the recent respiratory  infection and subjectively she feels stable. She is up-to-date with flu shot. She is planning a [place] in [place]. Her daughter Cristal Generous is a student there   [place] 01/25/2017  Chief Complaint  Patient presents with  . Follow-up    [name], she is doing well on her inhaler     FU ILD with UIP pattern   CHAI VERDEJO reports continuing to do well. She has completed her trip to [place], [place] and [place]. She did get bronchitis in one of the legs but overall feels well now and is better than baseline in terms of effort tolerance and cough. IN fact compared to a few years ago overall feels better She decline ofv v placebo trial at [facility] due to GI side effect concern (she has easy gi intolerance with some foods)). Says that current dyspnea for stairs and inclines does make her drop her pulse ox to 87% but is no different than her baseline. Walking desat dropped pulse ox to 89% at 3d lap. FVC shows mild decline over past fe year trend. She does not have portable o2 with her   OV 04/06/2017  Chief Complaint  Patient presents with  . Shortness of Breath    FOLLOW UP FOR SOB dme is [facility] patient is coughing and patient states that when she walks her O2 drops down into the 80's     Follow-up ILD with UIP pattern  Within the last 2 months we discussed at the multidisciplinary interstitial lung disease diagnostic conference. Per Dr. Lyndon Code that is UIP pattern. Although the predominant feature is hypersensitivity pneumonitis chronic granulomas. After last visit we started on portable oxygen at nighttime oxygen. She's had a horrendous time getting hold off a light portable oxygen system because of her chronic delays with [employer]. Then on 04/12/2017 she has upcoming trip to [place] and there've been problems with [employer] clarifying how she can take her oxygen. With all this and her nighttime oxygen use she has been really stressed emotionally about her  pulmonary fibrosis. She started feeling sense of mortality and therefore she is here with her husband to discuss  outlook and prognosis. Overall she feels stable but she does feel that she desaturates when she exerts. Also she does not feel that the new oxygen system is helping her. In addition she feels that she's not necessarily symptomatic when  her finger pulse ox of desaturation. Her chronic multifactorial cough continues. But overall she is most worried about prognosis. She does want EpiPen injection prescription for her travel. She is inquiring about lung transplant  Walking desaturation test on 04/06/2017 185 feet x 3 laps on room air with forehead probe and moderat walk:  did NOT desaturate < 88% Rest pulse ox was 97%, final pulse ox was 90%. HR response 80/min at rest to 92/min at peak exertion. Her personal finger pulse ox was lowre by 3% . So at peak exertion was 87% with finger probe   Walking desaturation test on 04/06/2017 185 feet x 3 laps with forehead probe and comparison her personal finger probe. On 2L [place] pulsed oxygog fit persnal:  did NOT  Desaturate below 95%. Rest pulse ox was 100%, final pulse ox was 96% (94% on hers). HR response 65/min at rest to 88/min at peak exertion. (her personal was reading HR 100)     has a past medical history of Allergy; Arthritis; Asthma; Cataract; Depression; DOE (dyspnea on exertion); GERD (gastroesophageal reflux disease); H/O steroid therapy; Heart palpitations (02/28/2015); Helicobacter pylori ab+; Hiatal hernia; High cholesterol; History of chronic bronchitis; Hyperplastic colon polyp (2007); Inguinal hernia; Insulin resistance; Interstitial lung disease ([place]); MVP (mitral valve prolapse); Pneumonitis, hypersensitivity ([place]); Pulmonary fibrosis ([place]); Pulmonary HTN ([place]); and Rapid heart rate.   reports that she quit smoking about 47 years ago. She started smoking about 47 years ago. She has never used smokeless tobacco.  Past Surgical History:   Procedure Laterality Date  . BREAST BIOPSY Right 2009   BIOPSY  . BREAST BIOPSY Left 2003   Benign   . CATARACT EXTRACTION Left   . CESAREAN SECTION    . FOOT FRACTURE SURGERY  2006 or 2007   right  . HYMENECTOMY    . LUNG BIOPSY  09/28/2012   Procedure: LUNG BIOPSY;  Surgeon: Melrose Nakayama, MD;  Location: [facility];  Service: Thoracic;  Laterality: N/A;  lung biopsies tims three  . TOTAL HIP ARTHROPLASTY Right 09/10/2015   Procedure: RIGHT TOTAL HIP ARTHROPLASTY ANTERIOR APPROACH;  Surgeon: Paralee Cancel, MD;  Location: [facility];  Service: Orthopedics;  Laterality: Right;  . TUBAL LIGATION    . VIDEO ASSISTED THORACOSCOPY  09/28/2012   Procedure: VIDEO ASSISTED THORACOSCOPY;  Surgeon: Melrose Nakayama, MD;  Location: [facility];  Service: Thoracic;  Laterality: Right;  Marland Kitchen VIDEO BRONCHOSCOPY  11/19/2011   Procedure: VIDEO BRONCHOSCOPY WITH FLUORO;  Surgeon: Brand Males, MD;  Location[facility];  Service: Endoscopy;;    Allergies  Allergen Reactions  . Atorvastatin Other (See Comments)    Leg pain  . Betadine [Povidone Iodine] Other (See Comments)    blisters  . Codeine Nausea And Vomiting  . Rosuvastatin Other (See Comments)    Leg pain  . Shellfish Allergy Other (See Comments)    vomiting  . Sulfonamide Derivatives Other (See Comments)    headaches    Immunization History  Administered Date(s) Administered  . Hepatitis A 05/23/2010  . Hepatitis B 06/23/2006  . Influenza Split 08/11/2012  . Influenza Whole 09/08/2011  . Influenza, High Dose Seasonal PF 08/24/2016  . Influenza,inj,Quad PF,36+ Mos 09/07/2013  . Influenza-Unspecified 09/26/2014, 07/27/2015  . MMR 05/23/2010  . Pneumococcal Conjugate-13 03/15/2014  . Pneumococcal Polysaccharide-23 03/15/2014  . Td 02/22/2003  . Tdap 10/28/2012  . Zoster 12/04/2010  . Zoster Recombinat (Shingrix) 02/15/2017    Family History  Problem Relation Age of Onset  . Emphysema Maternal  Grandmother   . Asthma Maternal  Grandmother   . Asthma Mother   . Osteoarthritis Mother   . Dementia Mother 40  . Lymphoma Father   . Diabetes Father   . Hypertension Sister   . Heart disease Sister   . Kidney disease Sister   . Lung disease Maternal Grandfather   . Bone cancer Paternal Grandfather   . Allergic rhinitis Daughter   . Breast cancer Paternal Aunt   . Ovarian cancer Maternal Aunt   . Breast cancer Cousin   . Colon cancer Neg Hx   . Angioedema Neg Hx   . Eczema Neg Hx   . Immunodeficiency Neg Hx   . Urticaria Neg Hx      Current Outpatient Prescriptions:  .  acetaminophen (TYLENOL) 325 MG tablet, Take 650 mg by mouth every 6 (six) hours as needed. Reported on 02/10/2016, Disp: , Rfl:  .  albuterol (PROAIR HFA) 108 (90 Base) MCG/ACT inhaler, Inhale 2 puffs into the lungs every 4 (four) hours as needed for wheezing. Or coughing spells.  You may use 2 Puffs 5-10 minutes before exercise., Disp: 1 Inhaler, Rfl: 3 .  Alpha-D-Galactosidase (BEANO PO), Take 1 tablet by mouth as needed (if eating onion or garlic). , Disp: , Rfl:  .  Ascorbic Acid (VITAMIN C) 1000 MG tablet, Take 1,000 mg by mouth daily., Disp: , Rfl:  .  BREO ELLIPTA 100-25 MCG/INH AEPB, INHALE 1 PUFF INTO THE LUNGS DAILY., Disp: 180 each, Rfl: 1 .  Calcium Carb-Cholecalciferol (CALCIUM 1000 + D PO), Take 1 tablet by mouth daily., Disp: , Rfl:  .  chlorpheniramine (CHLOR-TRIMETON) 4 MG tablet, Take 4 mg by mouth daily as needed for allergies. , Disp: , Rfl:  .  dicyclomine (BENTYL) 10 MG capsule, Take 10 mg by mouth 3 (three) times daily as needed for spasms (stomach cramping). Reported on 01/06/2016, Disp: , Rfl:  .  EPINEPHrine (EPIPEN 2-PAK) 0.3 mg/0.3 mL IJ SOAJ injection, Inject 0.3 mLs (0.3 mg total) into the muscle once., Disp: 2 Device, Rfl: 2 .  fluticasone (FLONASE) 50 MCG/ACT nasal spray, PLACE 2 SPRAYS INTO BOTH NOSTRILS DAILY., Disp: 16 g, Rfl: 0 .  gabapentin (NEURONTIN) 300 MG capsule, TAKE 1 CAPSULE (300 MG TOTAL) BY MOUTH 3  (THREE) TIMES DAILY. TAKE AS DIRECTED, Disp: 90 capsule, Rfl: 5 .  lactase (LACTAID) 3000 UNITS tablet, Take 3,000 Units by mouth as needed (when eating ice cream or dariy). , Disp: , Rfl:  .  metoprolol succinate (TOPROL-XL) 50 MG 24 hr tablet, TAKE 1 TABLET BY MOUTH EVERY DAY IMMEDIATELY FOLLOWING A MEAL., Disp: 30 tablet, Rfl: 0 .  montelukast (SINGULAIR) 10 MG tablet, Take 1 tablet (10 mg total) by mouth daily., Disp: 30 tablet, Rfl: 0 .  Multiple Vitamin (MULTIVITAMIN) tablet, Take 1 tablet by mouth daily., Disp: , Rfl:  .  Naproxen Sodium (ALEVE) 220 MG CAPS, Take 1-2 capsules by mouth as needed., Disp: , Rfl:  .  omeprazole (PRILOSEC) 20 MG capsule, Take 1 capsule (20 mg total) by mouth daily., Disp: 30 capsule, Rfl: 5 .  pravastatin (PRAVACHOL) 40 MG tablet, Take 0.5 tablets (20 mg total) by mouth daily. Please come in for a visit, Disp: 15 tablet, Rfl: 0 .  predniSONE (DELTASONE) 10 MG tablet, TAKE 1 TABLET (10 MG TOTAL) BY MOUTH DAILY WITH BREAKFAST., Disp: 30 tablet, Rfl: 1 .  predniSONE (DELTASONE) 5 MG tablet, TAKE 1 TABLET BY MOUTH DAILY WITH BREAKFAST. (Patient not taking: Reported on 04/06/2017),  Disp: 90 tablet, Rfl: 2 .  Probiotic Product (PROBIOTIC ADVANCED PO), Take 1 capsule by mouth daily., Disp: , Rfl:  .  Turmeric 500 MG CAPS, Take 1 capsule by mouth daily., Disp: , Rfl:    Review of Systems     Objective:   Physical Exam Vitals:   04/06/17 0941  BP: 118/70  Pulse: 73  SpO2: 97%  Weight: 150 lb (68 kg)  Height: 5' (1.524 m)    Estimated body mass index is 29.29 kg/m as calculated from the following:   Height as of this encounter: 5' (1.524 m).   Weight as of this encounter: 150 lb (68 kg). Detailed exam not done but she is tearful intermitenty    Assessment:       ICD-9-CM ICD-10-CM   1. ILD (interstitial lung disease) ([place]) 515 J84.9   2. Irritable larynx syndrome 478.79 J38.7        Plan:        ILD: Discussed disease and outlook : slow  progression v stability:   but there is variability with range of outcomes. O2 seems to help physiology  Cough: there is definite element of multifactorial cough with cough neuropathy  Will walk you with forehead probe and 2L [place] on your portable system to see if is working  For travel  -: use o2 for exertion and mountain esp if finger pulse ox is < 86% and in flight if needed  - ensure you are movnig yourself well in flight and wear compression stockings to prevent blood clot   -  2 x epi pen prescriptions    I will discuss at ATS meeting if your diagnosis is IPF and see if Esbriet might play a role in reducing risk for progression  Refer lung transplant evaluation @ [facility] - can go there after your [place] trip  - they expect weight loss, rehab, family support and atleast > $15,000 in cash for sundries   Be in touch with me on whatsapp   Followup - in June/july  2018: do Pre-bd spiro and dlco only. No lung volume or bd response. No post-bd spiro - return to see me in June/July 2018 after above test     > 50% of this > 40 min visit spent in face to face counseling or/and coordination of care   Dr. Brand Males, M.D., [facility].C.P Pulmonary and Critical Care Medicine Staff Physician [place] [facility] Pager: [phone], If no answer or between  15:00h - 7:00h: call [phone]  04/06/2017 10:34 AM

## 2017-04-06 NOTE — Addendum Note (Signed)
Addended by: Collier Salina on: 04/06/2017 11:55 AM   Modules accepted: Orders

## 2017-04-27 ENCOUNTER — Ambulatory Visit: Payer: Medicare Other | Admitting: Internal Medicine

## 2017-04-29 ENCOUNTER — Other Ambulatory Visit: Payer: Self-pay | Admitting: Internal Medicine

## 2017-05-06 ENCOUNTER — Other Ambulatory Visit: Payer: Self-pay | Admitting: Emergency Medicine

## 2017-05-06 MED ORDER — FLUTICASONE PROPIONATE 50 MCG/ACT NA SUSP: 2.0000 | Freq: Every day | NASAL | 0 refills | Status: DC

## 2017-05-17 ENCOUNTER — Telehealth: Payer: Self-pay | Admitting: Internal Medicine

## 2017-05-17 ENCOUNTER — Encounter: Payer: Self-pay | Admitting: Internal Medicine

## 2017-05-17 NOTE — Telephone Encounter (Signed)
LMTCB

## 2017-05-18 NOTE — Telephone Encounter (Signed)
lmtcb x2 for pt. 

## 2017-05-19 NOTE — Telephone Encounter (Signed)
Called and spoke with pt and she stated that she called and spoke with [employer] and they called her back and said that they have the order and that someone will call her and set this up.  Pt stated that she will call us back if they do not contact her.

## 2017-05-24 ENCOUNTER — Telehealth: Payer: Self-pay | Admitting: Internal Medicine

## 2017-05-24 DIAGNOSIS — J849 Interstitial pulmonary disease, unspecified: Secondary | ICD-10-CM

## 2017-05-24 NOTE — Telephone Encounter (Signed)
Spoke with pt. She is aware that we will send a new order to [place]. Order has been placed. Nothing further was needed.

## 2017-05-24 NOTE — Telephone Encounter (Signed)
Rhonda [name] just called. Says ONO on RA  I ordered in march 2018 not done yet. Says she has recently clled [facility] several time without answer. Can you please re-order. Patient is desirous to get it done this week before her [facility] app[facility] so [facility]  Dr. Brand Males, M.D., [facility].C.P Pulmonary and Critical Care Medicine Staff Physician [place] [facility] Pager: [phone], If no answer or between  15:00h - 7:00h: call [phone]  05/24/2017 11:48 AM

## 2017-06-03 LAB — PULMONARY FUNCTION TEST

## 2017-06-04 ENCOUNTER — Encounter: Payer: Self-pay | Admitting: Family Medicine

## 2017-06-04 ENCOUNTER — Ambulatory Visit: Payer: Medicare Other | Admitting: Family Medicine

## 2017-06-04 NOTE — Patient Instructions (Addendum)
-   With respect to fatty liver, you want to be extra careful to avoid fructose and high-sugar foods/drinks.    - Read through the materials on anti-inflammatory diet provided today, and follow as closely as you can.   - Ask to get your 25-hydroxy vit D tested.  I recommend that you keep your levels up above 40 mg/mL.   - I also recommend that you take a second dose of calcium at dinner or bedtime.  Ca-citrate is best absorbed.    - You may want to consult Roselyn Reef at [facility] [address]).      - Re. Phlegm:  Keep a symptom log.  Write down ANY time you notice it gets worse; look back to the last things you ate/drank and exposure to any particular air.    - Aim for at least 60 oz of water daily.    - Track your intake for the next 4 weeks.    - Get in the habit of drinking 12-16 oz of water first thing in the morning.

## 2017-06-04 NOTE — Progress Notes (Signed)
Medical Nutrition Therapy:  Appt start time: 1000 end time:  1100.  Assessment:  Primary concerns today: anti-infalammatory diet related to interstitial lung disease as well as GERD.  In addn, about once a month, [name] has GI symptoms - usually diarrhea, but sometimes nausea that last about 24 hours.  This is usually related to allium foods, to which she is allergic.    Learning Readiness: Change in progress  Usual eating pattern includes 3 meals and 0-1 snack per day. Frequent foods and beverages include [place] waters, unsweetened or diet iced tea, (bourbon & ginger ale 0-10 X mo); eggs, salmon, avocado, bread, smoothie with berries, almond milk, banana, [place] scrmbled egg biscuit w/ no butter/cheese, 4 oz diet Dr. Malachi Bonds (0-3 X wk).  Avoided foods include leafy greens, soy products, bell peppers (upsets stomach), allium, shellfish (allergy), most red meat, pork, choc, spicy foods, peanut butter (GERD).   Usual physical activity includes 25-30 min swimming 3 day, walking 0-3 X wk.  24-hr recall: (Up at 8 AM) B (9 AM)-   1 smoothie: berries, almond milk, 1/2 banana, pb powder, greens powder Snk ( AM)-   water L (2 PM)-  1 small salad, [place], balsamic vinaigrette, water Snk ( PM)-  1 Aussie Bite D (6:30 PM)-  [place]: fried fish, let, mango, 1 c white rice, sauce, veg spring roll, 12 oz beer Snk ( PM)-  --- Typical day? Yes.  except lunch usually is a sandwich; tuna salad.  Does not usually get green salad.  Goes out to lunch 1 X wk, and out to dinner ~2 X wk.   Progress Towards Goal(s):  In progress.   Nutritional Diagnosis:  NI-4.1 Inadequate bioactive substances intake As related to inflammation.  As evidenced by patient request for anti-inflammatory diet with respect to interstitial lung disease and food sensitivities as well as nutrients for osteoporosis.    Intervention: Nutrition education  Handouts given during visit include:  AVS  Handouts on anti-inflammatory  diet  Demonstrated degree of understanding via:  Teach Back   Barriers to learning/adherence to lifestyle change: No significant barriers apparent.   Monitoring/Evaluation:  Dietary intake, exercise, and body weight prn.  Patient encouraged to call with any Qs or to schedule F/U appt.

## 2017-06-07 ENCOUNTER — Encounter: Payer: Self-pay | Admitting: Internal Medicine

## 2017-06-07 ENCOUNTER — Telehealth: Payer: Self-pay | Admitting: Internal Medicine

## 2017-06-07 NOTE — Telephone Encounter (Signed)
Spoke with pt. States that she is finally receiving her ONO test from [place] today. She has been waiting on this since March. Pt just wanted to let us know this. She also wants [place] to know that she had a PFT done at [facility] but wants to keep her upcoming appointment for the PFT that MR wants her to have on 06/17/17. Nothing further was needed at this time.

## 2017-06-16 ENCOUNTER — Telehealth: Payer: Self-pay | Admitting: Internal Medicine

## 2017-06-16 NOTE — Telephone Encounter (Signed)
ono 7/165/18 - total time < 88% 0.45min. Does not need o2 atnight  Dr. Brand Males, M.D., [facility].C.P Pulmonary and Critical Care Medicine Staff Physician [place] [facility] and Critical [facility] Pager: [phone], If no answer or between  15:00h - 7:00h: call [phone]  06/16/2017 5:31 PM

## 2017-06-17 ENCOUNTER — Telehealth: Payer: Self-pay | Admitting: Internal Medicine

## 2017-06-17 ENCOUNTER — Ambulatory Visit (INDEPENDENT_AMBULATORY_CARE_PROVIDER_SITE_OTHER): Payer: Medicare Other | Admitting: Internal Medicine

## 2017-06-17 ENCOUNTER — Encounter: Payer: Self-pay | Admitting: Internal Medicine

## 2017-06-17 VITALS — BP 112/74 | HR 65 | Ht 60.5 in | Wt 147.0 lb

## 2017-06-17 DIAGNOSIS — J849 Interstitial pulmonary disease, unspecified: Secondary | ICD-10-CM | POA: Diagnosis not present

## 2017-06-17 DIAGNOSIS — J679 Hypersensitivity pneumonitis due to unspecified organic dust: Secondary | ICD-10-CM | POA: Diagnosis not present

## 2017-06-17 DIAGNOSIS — K219 Gastro-esophageal reflux disease without esophagitis: Secondary | ICD-10-CM | POA: Diagnosis not present

## 2017-06-17 NOTE — Telephone Encounter (Signed)
Hi Rhonda [name]  is well known to me and has ILD with gerd. She used to see DR Olevia Perches. She has had slow progression mild with ILD. WE want to ensure GERD is well controlled. She has lot of   ? IBS symptoms.   THanks  Dr. Brand Males, M.D., [facility].C.P Pulmonary and Critical Care Medicine Staff Physician [place] [facility] Pager: [phone], If no answer or between  15:00h - 7:00h: call [phone]  06/17/2017 11:37 AM

## 2017-06-17 NOTE — Patient Instructions (Addendum)
ICD-10-CM   1. ILD (interstitial lung disease) ([place]) J84.9   2. Hypersensitivity pneumonitis ([place]) J67.9   3. Gastroesophageal reflux disease, esophagitis presence not specified K21.9     OVerall disease appears stable  Since last visit (though mild progression over years) and cough better probablyu due to summer v increased prednisone  Plan Refer GI due to ILD and GERd - see Dr Hilarie Fredrickson - I will send a message to him  - optimal GERD control important to prevent ILD progression Continue prilosec Continue prednisone but reduce from 10mg  per day to 7.5mg /day due to skin burising   -0 will montior your couhg You can return o2 or keep it for heavy exertion You do not need o2 at night Do HRCT for monitoring since last year Refer pulmonary rehab - if you want FLu shot in fall Please talk to PCP Hayden Rasmussen, MD -  and ensure you get  shingarix vaccine   Followup - will call with CT results - in 6 months do Pre-bd spiro and dlco only. No lung volume or bd response. No post-bd spiro  - 6 months or sooner if needed

## 2017-06-17 NOTE — Progress Notes (Signed)
Subjective:     Patient ID: Rhonda [name], female   DOB: 1951-10-28, 66 y.o.   MRN: [id number]  HPI   #GE reflux with small hiatal hernia  - on ppi   #History of rapid heart rate not otherwise specified  - Start her on Lopressor  2008/2009 by primary care physician. History appears to correlate with onset of respiratory issues  - refuses to dc this drug as of 2014 discussion   # Hypersensitivity Pneumonitis and ILD  - Potential etiologies - cockateil x 2 for 18 years through end 2012. 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 Denies mold but lives in house built in [place] with a "weird baselment: and has humidifier  - first noted on CT chest 10/15/09 following trip to [place] (PE negative) but not described in 2003 CT chest report  - autoimmune panel: 10/23/11: Negative  -  Uderwennt bronch 11/19/11  - non diagnostic  - VATS Nov 2013 (done after initially refusing)- [place]. However, there is worrying trend of UIP pattern in the Upper lobes.            Oct/Nov 2012  March 2013 08/11/12  Nov/Dec 2013 Dx HP/UIP 01/02/2013  02/28/13 05/25/2013  12/06/2013  03/15/2014  06/29/2014  10'27/15 12/19/2014  02/26/15 01/13/2016  04/29/2016  10/23/2016  01/25/2017  06/17/2017 [facility] July 2018  Symp/Signs Dyspnea x5y  New crackles  dimished dyspnea except at hill. Improved crackles.  Improved dyspnea except @ hill              Fev1  1.9 L   1.7L/90%  1.66 L/87%   1.66 L/78%  1.75L/90% 1.68L/82% 1.56/75 1.6L/78% 1.48/75% 1.58L/77%  1.52L/78% 1.39/72% (jan) 1.61L/80%  1.53L/77% 1.82/71%  FVC  2.6 L    2.2 L/82%   2.1 L/80%   2.19 L/82%  2.2L/91% 2.1/81% 1.86/69% 1.9L/71% 1.86/75% 1.75L/65% 1.82/73% (dec 2015 offce spiro) 2.84L/72% 1.54/61% 1.78L/67%  1.69L/65%   Ratio     79   76/95%  80/99% 80/100%  84  90      90   TLC  4.3 L    3.3 L/79%  3.5 L/82%     3.22/72% 3.06L/68%  3.06/68%  3.78/87%  2.86/66% 2/98L/66%   ? 2.6/57%  DLCO  10/50%    10/51%   9.6/51%     11.9/63% 11.7/62%  11.561%  5.5/31% 10.06/57% 11/04/63%  10.43/55% 8.56/50%  Walk test 185 ft x 3 laps on RA  rest 92%. Pk exertion 90%. Pk HR 108 Rest 92%. Pk exertion 86% at 2.5 laps. Pk HR 102  pk exertion - 88% Rest 96%. Pk 95%. Pk HR is 75. Done at full dose lopressor Rest 94%. Pk 91%. Rest HR 93 with pk HR 103/min. Done at half dose lopressor REst 96%. Pk pulse ox 92%. Rest HR 65. Pk HR 86 Did not desat. Normal Pk HR  Pulse ox 96%, droped to89% at 2nd lap but bounced up and ended at 91% 3rd lap Did not desat Did not desat, Pk HHR 93  ;llowest pulse ox 90% at 2nd lap but she held her breath, then 92% at 3rd lap 99% -> 91% at end of 3rd lap 94%, Dropped to 87% at 3rd lap, HR peak102/m 97%, dropped to 89% in 3rd lap - asymptomatic. HR 100/min No prob with 39md at [facility]  CT chest   unchanged  yes Ct mid dec possibly worse - unsure Ct - similar to April 2016 but worse since 2013     Tests/Bx Bronch 0- non diagnostic   VATS - HP with UIP in Upper lobe                RX    Rx pred start 11/23/12 On  pred 74m per day and will reduce to 225mOn Pred 2043mer day On pred 17m72mr day On pred 5mg 23m day Rx pred 5mg d38my She reduced to pred 5mg x 24mt 1 month QOD due to GI concern side effects On pred 5mg qod59mt will increase to 5/d after this visit On pred 5mg dail83m On pred 5mg daily86mncrease pred to 17mg per d21meduce to pred 7.5 due to skin sissues                        OV 06/17/2017  Chief Complaint  Patient presents with  . Follow-up    [name]. Pt denies significant cough, denies CP/tightness, f/c/s.     She is better. Feels she does not need o2. Has [facility] to [facility] for [facility] clinic; felt too early. Seems higher dose prednisone helping but then she is also bettter t his time of the year. In May 2018 I was at [employer] and curbsided national thought leaders - feel that we could explore silent  active GERD as a possibility of ILD getting wors with time.    has a past medical history of Allergy; Arthritis; Asthma; Cataract; Depression; DOE (dyspnea on exertion); GERD (gastroesophageal reflux disease); H/O steroid therapy; Heart palpitations (02/28/2015); Helicobacter pylori ab+; Hiatal hernia; High cholesterol; History of chronic bronchitis; Hyperplastic colon polyp (2007); Inguinal hernia; Insulin resistance; Interstitial lung disease (HCC); MVP (Olivetral valve prolapse); Pneumonitis, hypersensitivity (HCC); Pulmo[place] fibrosis (HCC); Pulmo[place] HTN (HCC); and RLeedsd heart rate.   reports that she has never smoked. She has never used smokeless tobacco.  Past Surgical History:  Procedure Laterality Date  . BREAST BIOPSY Right 2009   BIOPSY  . BREAST BIOPSY Left 2003   Benign   . CATARACT EXTRACTION Left   . CESAREAN SECTION    . FOOT FRACTURE SURGERY  2006 or 2007   right  . HYMENECTOMY    . LUNG BIOPSY  09/28/2012   Procedure: LUNG BIOPSY;  Surgeon: Steven C HeMelrose Nakayamation: [facility] OR;  [place] [facility];  Laterality: N/A;  lung biopsies tims three  . TOTAL HIP ARTHROPLASTY Right 09/10/2015   Procedure: RIGHT TOTAL HIP ARTHROPLASTY ANTERIOR APPROACH;  Surgeon: Matthew OliParalee Canceltion: [facility];  Service: Orthopedics;  Laterality: Right;  . TUBAL LIGATION    . VIDEO ASSISTED THORACOSCOPY  09/28/2012   Procedure: VIDEO ASSISTED THORACOSCOPY;  Surgeon: Steven C HeMelrose Nakayamation: [facility];  [facility] [facility];  Laterality: Right;  . VIDEO BROMarland KitchenCHOSCOPY  11/19/2011   Procedure: VIDEO BRONCHOSCOPY WITH FLUORO;  Surgeon: Jasn Xia RamaBrand Malestion: [facility]  Service: Endoscopy;;    Allergies  Allergen Reactions  . Atorvastatin Other (See Comments)    Leg pain  . Betadine [Povidone Iodine] Other (See Comments)    blisters  . Codeine Nausea And Vomiting  . Rosuvastatin Other (See Comments)    Leg pain  . Shellfish Allergy Other (See Comments)    vomiting  .  Sulfonamide Derivatives Other (See Comments)    headaches  Immunization History  Administered Date(s) Administered  . Hepatitis A 05/23/2010  . Hepatitis B 06/23/2006  . Influenza Split 08/11/2012  . Influenza Whole 09/08/2011  . Influenza, High Dose Seasonal PF 08/24/2016  . Influenza,inj,Quad PF,36+ Mos 09/07/2013  . Influenza-Unspecified 09/26/2014, 07/27/2015  . MMR 05/23/2010  . Pneumococcal Conjugate-13 03/15/2014  . Td 02/22/2003  . Tdap 10/28/2012  . Zoster 12/04/2010  . Zoster Recombinat (Shingrix) 02/15/2017, 05/17/2017    Family History  Problem Relation Age of Onset  . Emphysema Maternal Grandmother   . Asthma Maternal Grandmother   . Asthma Mother   . Osteoarthritis Mother   . Dementia Mother 53  . Lymphoma Father   . Diabetes Father   . Hypertension Sister   . Heart disease Sister   . Kidney disease Sister   . Lung disease Maternal Grandfather   . Bone cancer Paternal Grandfather   . Allergic rhinitis Daughter   . Breast cancer Paternal Aunt   . Ovarian cancer Maternal Aunt   . Breast cancer Cousin   . Colon cancer Neg Hx   . Angioedema Neg Hx   . Eczema Neg Hx   . Immunodeficiency Neg Hx   . Urticaria Neg Hx      Current Outpatient Prescriptions:  .  acetaminophen (TYLENOL) 325 MG tablet, Take 650 mg by mouth every 6 (six) hours as needed. Reported on 02/10/2016, Disp: , Rfl:  .  albuterol (PROAIR HFA) 108 (90 Base) MCG/ACT inhaler, Inhale 2 puffs into the lungs every 4 (four) hours as needed for wheezing. Or coughing spells.  You may use 2 Puffs 5-10 minutes before exercise., Disp: 1 Inhaler, Rfl: 3 .  Alpha-D-Galactosidase (BEANO PO), Take 1 tablet by mouth as needed (if eating onion or garlic). , Disp: , Rfl:  .  Ascorbic Acid (VITAMIN C) 1000 MG tablet, Take 1,000 mg by mouth daily., Disp: , Rfl:  .  BREO ELLIPTA 100-25 MCG/INH AEPB, INHALE 1 PUFF INTO THE LUNGS DAILY., Disp: 180 each, Rfl: 1 .  Calcium Carb-Cholecalciferol (CALCIUM 1000 + D  PO), Take 1 tablet by mouth daily., Disp: , Rfl:  .  chlorpheniramine (CHLOR-TRIMETON) 4 MG tablet, Take 4 mg by mouth daily as needed for allergies. , Disp: , Rfl:  .  dicyclomine (BENTYL) 10 MG capsule, Take 10 mg by mouth 3 (three) times daily as needed for spasms (stomach cramping). Reported on 01/06/2016, Disp: , Rfl:  .  fluticasone (FLONASE) 50 MCG/ACT nasal spray, Place 2 sprays into both nostrils daily., Disp: 48 g, Rfl: 0 .  metoprolol succinate (TOPROL-XL) 50 MG 24 hr tablet, TAKE 1 TABLET BY MOUTH EVERY DAY IMMEDIATELY FOLLOWING A MEAL., Disp: 30 tablet, Rfl: 0 .  montelukast (SINGULAIR) 10 MG tablet, Take 1 tablet (10 mg total) by mouth daily., Disp: 30 tablet, Rfl: 0 .  Multiple Vitamin (MULTIVITAMIN) tablet, Take 1 tablet by mouth daily., Disp: , Rfl:  .  Naproxen Sodium (ALEVE) 220 MG CAPS, Take 1-2 capsules by mouth as needed., Disp: , Rfl:  .  omeprazole (PRILOSEC) 20 MG capsule, Take 1 capsule (20 mg total) by mouth daily., Disp: 30 capsule, Rfl: 5 .  pravastatin (PRAVACHOL) 40 MG tablet, Take 0.5 tablets (20 mg total) by mouth daily. Please come in for a visit, Disp: 15 tablet, Rfl: 0 .  predniSONE (DELTASONE) 10 MG tablet, TAKE 1 TABLET (10 MG TOTAL) BY MOUTH DAILY WITH BREAKFAST., Disp: 30 tablet, Rfl: 1 .  Probiotic Product (PROBIOTIC ADVANCED PO), Take 1 capsule by mouth  daily., Disp: , Rfl:  .  Turmeric 500 MG CAPS, Take 1 capsule by mouth daily., Disp: , Rfl:    Review of Systems     Objective:   Physical Exam  Constitutional: She is oriented to person, place, and time. She appears well-developed and well-nourished. No distress.  HENT:  Head: Normocephalic and atraumatic.  Right Ear: External ear normal.  Left Ear: External ear normal.  Mouth/Throat: Oropharynx is clear and moist. No oropharyngeal exudate.  Eyes: Pupils are equal, round, and reactive to light. Conjunctivae and EOM are normal. Right eye exhibits no discharge. Left eye exhibits no discharge. No  scleral icterus.  Neck: Normal range of motion. Neck supple. No JVD present. No tracheal deviation present. No thyromegaly present.  Cardiovascular: Normal rate, regular rhythm, normal heart sounds and intact distal pulses.  Exam reveals no gallop and no friction rub.   No murmur heard. Pulmonary/Chest: Effort normal. No respiratory distress. She has no wheezes. She has rales. She exhibits no tenderness.  Abdominal: Soft. Bowel sounds are normal. She exhibits no distension and no mass. There is no tenderness. There is no rebound and no guarding.  Musculoskeletal: Normal range of motion. She exhibits no edema or tenderness.  Lymphadenopathy:    She has no cervical adenopathy.  Neurological: She is alert and oriented to person, place, and time. She has normal reflexes. No cranial nerve deficit. She exhibits normal muscle tone. Coordination normal.  Skin: Skin is warm and dry. No rash noted. She is not diaphoretic. No erythema. No pallor.  Psychiatric: She has a normal mood and affect. Her behavior is normal. Judgment and thought content normal.  Vitals reviewed.   Vitals:   06/17/17 1047  BP: 112/74  Pulse: 65  SpO2: 98%  Weight: 147 lb (66.7 kg)  Height: 5' 0.5" (1.537 m)    Estimated body mass index is 28.24 kg/m as calculated from the following:   Height as of this encounter: 5' 0.5" (1.537 m).   Weight as of this encounter: 147 lb (66.7 kg).     Assessment:       ICD-10-CM   1. ILD (interstitial lung disease) ([place]) J84.9   2. Hypersensitivity pneumonitis ([place]) J67.9   3. Gastroesophageal reflux disease, esophagitis presence not specified K21.9        Plan:     OVerall disease appears stable  Since last visit (though mild progression over years) and cough better probablyu due to summer v increased prednisone  Plan Refer GI due to ILD and GERd - see Dr Hilarie Fredrickson - I will send a message to him  - optimal GERD control important to prevent ILD progression Continue  prilosec Continue prednisone but reduce from 46m per day to 7.559mday due to skin burising   -0 will montior your couhg You can return o2 or keep it for heavy exertion You do not need o2 at night Do HRCT for monitoring since last year Refer pulmonary rehab - if you want FLu shot in fall Please talk to PCP RiHayden RasmussenMD -  and ensure you get  shingarix vaccine   Followup - will call with CT results - in 6 months do Pre-bd spiro and dlco only. No lung volume or bd response. No post-bd spiro  - 6 months or sooner if needed  > 50% of this > 25 min visit spent in face to face counseling or coordination of care    Dr. MuBrand MalesM.D., F.[facility].P Pulmonary and Critical Care Medicine Staff  Physician [place] [facility] Pager: [phone], If no answer or between  15:00h - 7:00h: call [phone]  06/17/2017 11:36 AM

## 2017-06-17 NOTE — Telephone Encounter (Signed)
ok 

## 2017-06-17 NOTE — Telephone Encounter (Signed)
Pt seen today 06/17/17 for OV. Results were reviewed with her. Will sign off.

## 2017-06-21 NOTE — Telephone Encounter (Signed)
Left message for patient to call back and schedule appointment.

## 2017-06-24 ENCOUNTER — Ambulatory Visit (INDEPENDENT_AMBULATORY_CARE_PROVIDER_SITE_OTHER)
Admission: RE | Admit: 2017-06-24 | Discharge: 2017-06-24 | Disposition: A | Discharge status: Home / Self Care | Payer: Medicare Other | Source: Ambulatory Visit | Attending: Internal Medicine | Admitting: Internal Medicine

## 2017-06-24 DIAGNOSIS — C44629 Squamous cell carcinoma of skin of left upper limb, including shoulder: Secondary | ICD-10-CM | POA: Diagnosis not present

## 2017-06-24 DIAGNOSIS — J849 Interstitial pulmonary disease, unspecified: Secondary | ICD-10-CM | POA: Diagnosis not present

## 2017-06-24 DIAGNOSIS — I471 Supraventricular tachycardia: Secondary | ICD-10-CM | POA: Diagnosis not present

## 2017-06-24 DIAGNOSIS — L989 Disorder of the skin and subcutaneous tissue, unspecified: Secondary | ICD-10-CM | POA: Diagnosis not present

## 2017-06-24 DIAGNOSIS — K580 Irritable bowel syndrome with diarrhea: Secondary | ICD-10-CM | POA: Diagnosis not present

## 2017-06-24 DIAGNOSIS — E785 Hyperlipidemia, unspecified: Secondary | ICD-10-CM | POA: Diagnosis not present

## 2017-06-24 DIAGNOSIS — J8410 Pulmonary fibrosis, unspecified: Secondary | ICD-10-CM | POA: Diagnosis not present

## 2017-06-28 DIAGNOSIS — D692 Other nonthrombocytopenic purpura: Secondary | ICD-10-CM | POA: Diagnosis not present

## 2017-06-28 DIAGNOSIS — D485 Neoplasm of uncertain behavior of skin: Secondary | ICD-10-CM | POA: Diagnosis not present

## 2017-06-28 DIAGNOSIS — D1800 Hemangioma unspecified site: Secondary | ICD-10-CM | POA: Diagnosis not present

## 2017-06-28 DIAGNOSIS — D225 Melanocytic nevi of trunk: Secondary | ICD-10-CM | POA: Diagnosis not present

## 2017-06-28 DIAGNOSIS — L821 Other seborrheic keratosis: Secondary | ICD-10-CM | POA: Diagnosis not present

## 2017-06-28 DIAGNOSIS — L814 Other melanin hyperpigmentation: Secondary | ICD-10-CM | POA: Diagnosis not present

## 2017-06-28 NOTE — Progress Notes (Signed)
Called and spoke to pt. Informed her of the results per MR. Pt verbalized understanding and denied any further questions or concerns at this time.  

## 2017-07-02 DIAGNOSIS — R7302 Impaired glucose tolerance (oral): Secondary | ICD-10-CM | POA: Diagnosis not present

## 2017-07-02 DIAGNOSIS — N611 Abscess of the breast and nipple: Secondary | ICD-10-CM | POA: Diagnosis not present

## 2017-08-02 DIAGNOSIS — E119 Type 2 diabetes mellitus without complications: Secondary | ICD-10-CM | POA: Diagnosis not present

## 2017-08-02 DIAGNOSIS — C44629 Squamous cell carcinoma of skin of left upper limb, including shoulder: Secondary | ICD-10-CM | POA: Diagnosis not present

## 2017-08-02 DIAGNOSIS — K580 Irritable bowel syndrome with diarrhea: Secondary | ICD-10-CM | POA: Diagnosis not present

## 2017-08-02 DIAGNOSIS — I70213 Atherosclerosis of native arteries of extremities with intermittent claudication, bilateral legs: Secondary | ICD-10-CM | POA: Diagnosis not present

## 2017-08-02 DIAGNOSIS — I10 Essential (primary) hypertension: Secondary | ICD-10-CM | POA: Diagnosis not present

## 2017-08-02 DIAGNOSIS — E785 Hyperlipidemia, unspecified: Secondary | ICD-10-CM | POA: Diagnosis not present

## 2017-08-02 DIAGNOSIS — Z8249 Family history of ischemic heart disease and other diseases of the circulatory system: Secondary | ICD-10-CM | POA: Diagnosis not present

## 2017-08-02 DIAGNOSIS — R7302 Impaired glucose tolerance (oral): Secondary | ICD-10-CM | POA: Diagnosis not present

## 2017-08-02 DIAGNOSIS — E783 Hyperchylomicronemia: Secondary | ICD-10-CM | POA: Diagnosis not present

## 2017-08-05 DIAGNOSIS — C44629 Squamous cell carcinoma of skin of left upper limb, including shoulder: Secondary | ICD-10-CM | POA: Diagnosis not present

## 2017-08-05 DIAGNOSIS — L905 Scar conditions and fibrosis of skin: Secondary | ICD-10-CM | POA: Diagnosis not present

## 2017-08-06 ENCOUNTER — Encounter: Payer: Self-pay | Admitting: Cardiology

## 2017-08-17 DIAGNOSIS — Z23 Encounter for immunization: Secondary | ICD-10-CM | POA: Diagnosis not present

## 2017-08-19 ENCOUNTER — Ambulatory Visit (INDEPENDENT_AMBULATORY_CARE_PROVIDER_SITE_OTHER): Payer: Medicare Other | Admitting: Cardiology

## 2017-08-19 ENCOUNTER — Encounter: Payer: Self-pay | Admitting: Cardiology

## 2017-08-19 VITALS — BP 126/78 | HR 76 | Ht 60.0 in | Wt 145.2 lb

## 2017-08-19 DIAGNOSIS — E782 Mixed hyperlipidemia: Secondary | ICD-10-CM | POA: Diagnosis not present

## 2017-08-19 DIAGNOSIS — I272 Pulmonary hypertension, unspecified: Secondary | ICD-10-CM

## 2017-08-19 DIAGNOSIS — I493 Ventricular premature depolarization: Secondary | ICD-10-CM | POA: Diagnosis not present

## 2017-08-19 DIAGNOSIS — I341 Nonrheumatic mitral (valve) prolapse: Secondary | ICD-10-CM

## 2017-08-19 DIAGNOSIS — I2510 Atherosclerotic heart disease of native coronary artery without angina pectoris: Secondary | ICD-10-CM

## 2017-08-19 HISTORY — DX: Mixed hyperlipidemia: E78.2

## 2017-08-19 HISTORY — DX: Atherosclerotic heart disease of native coronary artery without angina pectoris: I25.10

## 2017-08-19 HISTORY — DX: Ventricular premature depolarization: I49.3

## 2017-08-19 NOTE — Progress Notes (Signed)
Cardiology Office Note:    Date:  08/19/2017   ID:  Rhonda [name], DOB [birth year], MRN [id number]  PCP:  Hayden Rasmussen, MD  Cardiologist:  Fransico Him, MD   Referring MD: Hayden Rasmussen, MD   Chief Complaint  Patient presents with  . Follow-up    Palpitations, PVCs    History of Present Illness:    Rhonda [name] is a 66 y.o. female with a hx of ILD followed by Pulmonary and the Transplant team at [facility], GERD, dyslipidemia, coronary artery calcifications,  PVCs and abnormal EKG with normal nuclear stress test 2016.  She is here today for followup and is doing well.  She denies any chest pain or pressure, PND, orthopnea, LE edema, dizziness, palpitations or syncope. Her SOB is stable and only notices it when going up stairs.  She has O2 that she uses only when hiking, going to high altitudes or walking up a lot of stairs.  She complains of a lot of phlegm production.  She is compliant with her meds and is tolerating meds with no SE.    Past Medical History:  Diagnosis Date  . Allergy   . Arthritis    history spinal stenosis. osteoarthritis right hip  . Asthma   . Cataract   . Coronary artery calcification seen on CAT scan 08/19/2017  . Depression   . DOE (dyspnea on exertion)    a. 04/2010 Lexi MV EF 71%, no ischemia/infarct;    . GERD (gastroesophageal reflux disease)   . H/O steroid therapy    Steroid use orally over 4 yrs- for Lung Fibrosis  . Heart palpitations 02/28/2015  . Helicobacter pylori ab+   . Hiatal hernia   . High cholesterol   . History of chronic bronchitis    as child  . Hyperlipidemia, mixed 08/19/2017  . Hyperplastic colon polyp 2007  . Inguinal hernia    right  . Insulin resistance    past  . Interstitial lung disease ([place])   . MVP (mitral valve prolapse)    Posterior mitral valve leaflet with mild MR  . Pneumonitis, hypersensitivity ([place])    a. 09/2012 s/p Bx - ? 2/2 bird, mold, oil paint exposure ->on steroids, followed by pulm.  .  Pulmonary fibrosis (HCC)    Dr. Chase Caller follows- stable at present  . Pulmonary HTN ([place])    mild PASP 6mmHg on echo 02/2015  . PVC (premature ventricular contraction) 08/19/2017  . Rapid heart rate    Dr. Radford Pax follows- last visit Epic note 9'16    Past Surgical History:  Procedure Laterality Date  . BREAST BIOPSY Right 2009   BIOPSY  . BREAST BIOPSY Left 2003   Benign   . CATARACT EXTRACTION Left   . CESAREAN SECTION    . FOOT FRACTURE SURGERY  2006 or 2007   right  . HYMENECTOMY    . LUNG BIOPSY  09/28/2012   Procedure: LUNG BIOPSY;  Surgeon: Melrose Nakayama, MD;  Location: [facility];  Service: Thoracic;  Laterality: N/A;  lung biopsies tims three  . TOTAL HIP ARTHROPLASTY Right 09/10/2015   Procedure: RIGHT TOTAL HIP ARTHROPLASTY ANTERIOR APPROACH;  Surgeon: Paralee Cancel, MD;  Location: [facility];  Service: Orthopedics;  Laterality: Right;  . TUBAL LIGATION    . VIDEO ASSISTED THORACOSCOPY  09/28/2012   Procedure: VIDEO ASSISTED THORACOSCOPY;  Surgeon: Melrose Nakayama, MD;  Location: [facility];  Service: Thoracic;  Laterality: Right;  Marland Kitchen VIDEO BRONCHOSCOPY  11/19/2011  Procedure: VIDEO BRONCHOSCOPY WITH FLUORO;  Surgeon: Brand Males, MD;  Location: [facility];  Service: Endoscopy;;    Current Medications: Current Meds  Medication Sig  . acetaminophen (TYLENOL) 325 MG tablet Take 650 mg by mouth every 6 (six) hours as needed. Reported on 02/10/2016  . albuterol (PROAIR HFA) 108 (90 Base) MCG/ACT inhaler Inhale 2 puffs into the lungs every 4 (four) hours as needed for wheezing. Or coughing spells.  You may use 2 Puffs 5-10 minutes before exercise.  . Alpha-D-Galactosidase (BEANO PO) Take 1 tablet by mouth as needed (if eating onion or garlic).   . Ascorbic Acid (VITAMIN C) 1000 MG tablet Take 1,000 mg by mouth daily.  Marland Kitchen BREO ELLIPTA 100-25 MCG/INH AEPB INHALE 1 PUFF INTO THE LUNGS DAILY.  . Calcium Carb-Cholecalciferol (CALCIUM 1000 + D PO) Take 1 tablet by mouth daily.    . chlorpheniramine (CHLOR-TRIMETON) 4 MG tablet Take 4 mg by mouth daily as needed for allergies.   Marland Kitchen dicyclomine (BENTYL) 10 MG capsule Take 10 mg by mouth 3 (three) times daily as needed for spasms (stomach cramping). Reported on 01/06/2016  . fluticasone (FLONASE) 50 MCG/ACT nasal spray Place 2 sprays into both nostrils daily.  . metFORMIN (GLUCOPHAGE-XR) 500 MG 24 hr tablet Take 500 mg by mouth daily.  . metoprolol succinate (TOPROL-XL) 50 MG 24 hr tablet TAKE 1 TABLET BY MOUTH EVERY DAY IMMEDIATELY FOLLOWING A MEAL.  . montelukast (SINGULAIR) 10 MG tablet Take 1 tablet (10 mg total) by mouth daily.  . Multiple Vitamin (MULTIVITAMIN) tablet Take 1 tablet by mouth daily.  . Naproxen Sodium (ALEVE) 220 MG CAPS Take 1-2 capsules by mouth as needed.  Marland Kitchen omeprazole (PRILOSEC) 20 MG capsule Take 1 capsule (20 mg total) by mouth daily.  . pravastatin (PRAVACHOL) 40 MG tablet Take 40 mg by mouth daily.  . predniSONE (DELTASONE) 10 MG tablet TAKE 1 TABLET (10 MG TOTAL) BY MOUTH DAILY WITH BREAKFAST.  Marland Kitchen Probiotic Product (PROBIOTIC ADVANCED PO) Take 1 capsule by mouth daily.  . Turmeric 500 MG CAPS Take 1 capsule by mouth daily.  . [DISCONTINUED] pravastatin (PRAVACHOL) 40 MG tablet Take 0.5 tablets (20 mg total) by mouth daily. Please come in for a visit (Patient taking differently: Take 40 mg by mouth daily. Please come in for a visit)     Allergies:   Atorvastatin; Betadine [povidone iodine]; Codeine; Rosuvastatin; Shellfish allergy; and Sulfonamide derivatives   Social History   Social History  . Marital status: Married    Spouse name: [place]  . Number of children: 1  . Years of education: N/A   Occupational History  . SELF EMPLOYED     speech patholoigists  .  [employer]   Social History Main Topics  . Smoking status: Never Smoker  . Smokeless tobacco: Never Used     Comment: pt states she experimented in college  . Alcohol use 1.2 oz/week    1 Glasses of wine, 1  Standard drinks or equivalent per week     Comment: once a wk. 1-2 glasses  . Drug use: No  . Sexual activity: No     Comment: sex partners in the last 12 months 0   Other Topics Concern  . None   Social History Narrative   Married; Electrical engineer      Exercise - swimming daily for 20-30 minutes   Patient was started her periods at 66 year old (regular periods), and painful periods  Family History: The patient's family history includes Allergic rhinitis in her daughter; Asthma in her maternal grandmother and mother; Bone cancer in her paternal grandfather; Breast cancer in her cousin and paternal aunt; Dementia (age of onset: 21) in her mother; Diabetes in her father; Emphysema in her maternal grandmother; Heart disease in her sister; Hypertension in her sister; Kidney disease in her sister; Lung disease in her maternal grandfather; Lymphoma in her father; Osteoarthritis in her mother; Ovarian cancer in her maternal aunt. There is no history of Colon cancer, Angioedema, Eczema, Immunodeficiency, or Urticaria.  ROS:   Please see the history of present illness.    ROS  All other systems reviewed and negative.   EKGs/Labs/Other Studies Reviewed:    The following studies were reviewed today: none  EKG:  EKG is  ordered today.  The ekg ordered today demonstrates NSR at 76bpm with ST/T wave abnormality c/w inferior and anterolateral ischemia.  Compared to EKG 2016 T wave abnormality is more prominent but unchanged from 2014  Recent Labs: No results found for requested labs within last 8760 hours.   Recent Lipid Panel    Component Value Date/Time   CHOL 184 02/26/2015 1540   TRIG 185 (H) 02/26/2015 1540   HDL 53 02/26/2015 1540   CHOLHDL 3.5 02/26/2015 1540   VLDL 37 02/26/2015 1540   LDLCALC 94 02/26/2015 1540    Physical Exam:    VS:  BP 126/78   Pulse 76   Ht 5' (1.524 m)   Wt 145 lb 3.2 oz (65.9 kg)   SpO2 96%   BMI 28.36 kg/m     Wt Readings from  Last 3 Encounters:  08/19/17 145 lb 3.2 oz (65.9 kg)  06/17/17 147 lb (66.7 kg)  06/04/17 148 lb 9.6 oz (67.4 kg)     GEN:  Well nourished, well developed in no acute distress HEENT: Normal NECK: No JVD; No carotid bruits LYMPHATICS: No lymphadenopathy CARDIAC: RRR, no murmurs, rubs, gallops RESPIRATORY:  Clear to auscultation without rales, wheezing or rhonchi  ABDOMEN: Soft, non-tender, non-distended MUSCULOSKELETAL:  No edema; No deformity  SKIN: Warm and dry NEUROLOGIC:  Alert and oriented x 3 PSYCHIATRIC:  Normal affect   ASSESSMENT:    1. Pulmonary HTN ([place])   2. Coronary artery calcification seen on CAT scan   3. Hyperlipidemia, mixed   4. PVC (premature ventricular contraction)   5. MVP (mitral valve prolapse)    PLAN:    In order of problems listed above:  1.  Mild Pulmonary HTN - PASP 83mmHg on echo 2016.  Given her significant steroid dependent interstital lung disease I will repeat an echo to make sure her pulmonary HTN has not progressed.    2.  Coronary artery calcifications on Chest CT - these have been noted on prior CTs.  She denies any anginal symptoms.  Her LDL was 111 recently and she is concerned about her TAGs being high and wants to know if her PCP should treat her lipids more aggressively.  I have recommended that we get a chest CT for calcium score and a nuclear stress test to rule out ischemia.  I have asked her to let me know if she develops chest pain or pressure, worsening SOB from her baseline or exertional fatigue that is new.  If her calcium score is in the moderate to high risk category then will need to be more aggressive with lipid management.    3.  Hyperlipidemia - she is intolerant to Liptor  and crestor and gets memory issues at higher doses of pravastatin. If her coronary calcium score comes back in the moderate to high risk category then may need to consider PSCK 9 drug.    4.  PVCs - these are suppressed on BB.    5.  MVP of the posterior  MVL with mild MR by echo 2016 - repeat echo to assess stability   Medication Adjustments/Labs and Tests Ordered: Current medicines are reviewed at length with the patient today.  Concerns regarding medicines are outlined above.  Orders Placed This Encounter  Procedures  . CT CARDIAC SCORING  . EKG 12-Lead  . MYOCARDIAL PERFUSION IMAGING  . ECHOCARDIOGRAM COMPLETE   No orders of the defined types were placed in this encounter.   Signed, Fransico Him, MD  08/19/2017 3:07 PM    [place]

## 2017-08-19 NOTE — Patient Instructions (Signed)
Medication Instructions:  The current medical regimen is effective;  continue present plan and medications.  Labwork: none  Testing/Procedures: Your physician has requested that you have an echocardiogram. Echocardiography is a painless test that uses sound waves to create images of your heart. It provides your doctor with information about the size and shape of your heart and how well your heart's chambers and valves are working. This procedure takes approximately one hour. There are no restrictions for this procedure.  Your physician has requested that you have a lexiscan myoview. For further information please visit [email or web address]. Please follow instruction sheet, as given.  Your physician has requested that you have cardiac CT. Cardiac computed tomography (CT) is a painless test that uses an x-ray machine to take clear, detailed pictures of your heart. For further information please visit [email or web address]. Please follow instruction sheet as given.  Follow-Up: Follow up in 1 year with Dr. Radford Pax.  You will receive a letter in the mail 2 months before you are due.  Please call us when you receive this letter to schedule your follow up appointment.  If you need a refill on your cardiac medications before your next appointment, please call your pharmacy.  Thank you for choosing [facility]!!

## 2017-08-20 ENCOUNTER — Ambulatory Visit (INDEPENDENT_AMBULATORY_CARE_PROVIDER_SITE_OTHER)
Admission: RE | Admit: 2017-08-20 | Discharge: 2017-08-20 | Disposition: A | Discharge status: Home / Self Care | Payer: Medicare Other | Source: Ambulatory Visit | Attending: Cardiology | Admitting: Cardiology

## 2017-08-20 DIAGNOSIS — I2510 Atherosclerotic heart disease of native coronary artery without angina pectoris: Secondary | ICD-10-CM

## 2017-08-23 ENCOUNTER — Encounter: Payer: Self-pay | Admitting: Internal Medicine

## 2017-08-23 ENCOUNTER — Ambulatory Visit (INDEPENDENT_AMBULATORY_CARE_PROVIDER_SITE_OTHER): Payer: Medicare Other | Admitting: Internal Medicine

## 2017-08-23 ENCOUNTER — Other Ambulatory Visit: Payer: Self-pay | Admitting: Cardiology

## 2017-08-23 VITALS — BP 122/70 | HR 76 | Ht 59.75 in | Wt 143.5 lb

## 2017-08-23 DIAGNOSIS — I493 Ventricular premature depolarization: Secondary | ICD-10-CM

## 2017-08-23 DIAGNOSIS — K219 Gastro-esophageal reflux disease without esophagitis: Secondary | ICD-10-CM | POA: Diagnosis not present

## 2017-08-23 DIAGNOSIS — J849 Interstitial pulmonary disease, unspecified: Secondary | ICD-10-CM

## 2017-08-23 DIAGNOSIS — I2510 Atherosclerotic heart disease of native coronary artery without angina pectoris: Secondary | ICD-10-CM

## 2017-08-23 NOTE — Patient Instructions (Addendum)
You have been scheduled for a 24 Hour PH Probe test at [facility] on 09/08/17 at 8:30 am. Please arrive 30 minutes prior to your procedure for registration. You will need to go to outpatient registration (1st floor of the hospital) first. Make certain to bring your insurance cards as well as a complete list of medications.  Please remember the following:  1) Do not take any muscle relaxants, xanax (alprazolam) or ativan for 1 day prior to your  test as well as the day of the test.  2) Nothing to eat or drink after 12:00 midnight on the night before your test.  3) Hold all diabetic medications/insulin the morning of the test. You may eat and take your medications after the test.  It will take at least 2 weeks to receive the results of this test from your physician. ------------------------------------------ ABOUT 24 HOUR [place] PROBE An esophageal pH test measures and records the pH in your esophagus to determine if you have gastroesophageal reflux disease (GERD). The test can also be done to determine the effectiveness of medications or surgical treatment for GERD. What is esophageal reflux? Esophageal reflux is a condition in which stomach acid refluxes or moves back into the esophagus (the "food pipe" leading from the mouth to the stomach). How does the esophageal pH test work? A thin, small tube with an acid sensing device on the tip is gently passed through your nose, down the esophagus ("food tube"), and positioned about 2 inches above the lower esophageal sphincter. The tube is secured to the side of your face with clear tape. The end of the tube exiting from your nose is attached to a portable recorder that is worn on your belt or over your shoulder. The recorder has several buttons on it that you will press to mark certain events. A nurse will review the monitoring instructions with you. Once the test has begun, what do I need to know and do? Activity: Follow your usual daily  routine. Do not reduce or change your activities during the monitoring period. Doing so can make the monitoring results less useful.  Note: do not take a tub bath or shower; the equipment can't get wet.  Eating: Eat your regular meals at the usual times. If you do not eat during the monitoring period, your stomach will not produce acid as usual, and the test results will not be accurate. Eat at least 2 meals a day. Eat foods that tend to increase your symptoms (without making yourself miserable). Avoid snacking. Do not suck on hard candy or lozenges and do not chew gum during the monitoring period.  Lying down: Remain upright throughout the day. Do not lie down until you go to bed (unless napping or lying down during the day is part of your daily routine).  Medications: Continue to follow your doctor's advice regarding medications to avoid during the monitoring period.  Recording symptoms: Press the appropriate button on your recorder when symptoms occur (as discussed with the nurse).  Recording events: Record the time you start and stop eating and drinking (anything other than plain water). Record the time you lie down (even if just resting) and when you get back up. The nurse will explain this.  Unusual symptoms or side effects. If you think you may be experiencing any unusual symptoms or side effects, call your doctor.  You will return the next day to have the tube removed. The information on the recorder will be downloaded to a computer  and the results will be analyzed.  After completion of the study Resume your normal diet and medications. Lozenges or hard candy may help ease any sore throat caused by the tube.     If you are age 58 or older, your body mass index should be between 23-30. Your Body mass index is 28.26 kg/m. If this is out of the aforementioned range listed, please consider follow up with your Primary Care Provider.  If you are age 19 or younger, your body mass index should be  between 19-25. Your Body mass index is 28.26 kg/m. If this is out of the aformentioned range listed, please consider follow up with your Primary Care Provider.

## 2017-08-23 NOTE — Progress Notes (Signed)
Patient ID: Rhonda [name], female   DOB: [birth year], 66 y.o.   MRN: [id number] HPI: Rhonda [name] is a 66 year old female with a past medical history of ILD, hypersensitivity pneumonitis, GERD, history of hiatal hernia, IBS, possible mild pulmonary hypertension who is seen in consultation at the request of Dr. Chase Caller to evaluate reflux particularly in relation to her interstitial lung disease. She is here alone today. She was previously seen and managed by Dr. Delfin Edis prior to her retirement.  The patient reports she is following closely with pulmonology here and is also been seen in pretransplant evaluation at [facility]. She reports testing at [facility] revealed that her lung disease was mild enough that she did not need lung transplantation consideration at this time. She reports that she has had chronic cough and also chronic throat clearing and the need to clear her upper airway of mucus. This seems to be worse at night. It is also worse with eating chocolate. She has changed her diet dramatically needs a primarily plant-based diet and also fish. She is very little dairy and she is certain that dairy products worsen her phlegm and secretions and therefore coughing. She denies feeling true heartburn and is taking omeprazole 10 mg daily. She denies dysphagia and odynophagia. She reduced her on omeprazole from 20-10 mg in order to try to minimize dosing but also because she was not having heartburn symptoms. If she does not take low-dose omeprazole she will occasionally feel nocturnal heartburn. Separate from this she does have crampy abdominal pain and loose stools with certain foods such as onions, garlic and dairy products. Without this her stools are mostly regular, formed without blood or melena.  She had prior upper endoscopy in May 2015 with Dr. Olevia Perches. This revealed a small hiatal hernia. Stomach was biopsied and unrevealing. There was no H. pylori. She had colonoscopy in July 2014 which was normal.  She denies a specific family history of GI malignancy  Past Medical History:  Diagnosis Date  . Allergy   . Arthritis    history spinal stenosis. osteoarthritis right hip  . Asthma   . Cataract   . Coronary artery calcification seen on CAT scan 08/19/2017  . Depression   . DOE (dyspnea on exertion)    a. 04/2010 Lexi MV EF 71%, no ischemia/infarct;    . GERD (gastroesophageal reflux disease)   . H/O steroid therapy    Steroid use orally over 4 yrs- for Lung Fibrosis  . Heart palpitations 02/28/2015  . Helicobacter pylori ab+   . Hiatal hernia   . High cholesterol   . History of chronic bronchitis    as child  . Hyperlipidemia, mixed 08/19/2017  . Hyperplastic colon polyp 2007  . Inguinal hernia    right  . Insulin resistance    past  . Interstitial lung disease ([place])   . MVP (mitral valve prolapse)    Posterior mitral valve leaflet with mild MR  . Pneumonitis, hypersensitivity ([place])    a. 09/2012 s/p Bx - ? 2/2 bird, mold, oil paint exposure ->on steroids, followed by pulm.  . Pulmonary fibrosis (HCC)    Dr. Chase Caller follows- stable at present  . Pulmonary HTN ([place])    mild PASP 17mmHg on echo 02/2015  . PVC (premature ventricular contraction) 08/19/2017  . Rapid heart rate    Dr. Radford Pax follows- last visit Epic note 9'16    Past Surgical History:  Procedure Laterality Date  . BREAST BIOPSY Right 2009   BIOPSY  .  BREAST BIOPSY Left 2003   Benign   . CATARACT EXTRACTION Left   . CESAREAN SECTION    . FOOT FRACTURE SURGERY  2006 or 2007   right  . HYMENECTOMY    . LUNG BIOPSY  09/28/2012   Procedure: LUNG BIOPSY;  Surgeon: Melrose Nakayama, MD;  Location: [place];  Service: Thoracic;  Laterality: N/A;  lung biopsies tims three  . SQUAMOUS CELL CARCINOMA EXCISION Left    left arm  . TOTAL HIP ARTHROPLASTY Right 09/10/2015   Procedure: RIGHT TOTAL HIP ARTHROPLASTY ANTERIOR APPROACH;  Surgeon: Paralee Cancel, MD;  Location: [facility];  Service: Orthopedics;  Laterality:  Right;  . TUBAL LIGATION    . VIDEO ASSISTED THORACOSCOPY  09/28/2012   Procedure: VIDEO ASSISTED THORACOSCOPY;  Surgeon: Melrose Nakayama, MD;  Location: [place];  Service: Thoracic;  Laterality: Right;  Marland Kitchen VIDEO BRONCHOSCOPY  11/19/2011   Procedure: VIDEO BRONCHOSCOPY WITH FLUORO;  Surgeon: Brand Males, MD;  Location[facility];  Service: Endoscopy;;    Outpatient Medications Prior to Visit  Medication Sig Dispense Refill  . acetaminophen (TYLENOL) 325 MG tablet Take 650 mg by mouth every 6 (six) hours as needed. Reported on 02/10/2016    . albuterol (PROAIR HFA) 108 (90 Base) MCG/ACT inhaler Inhale 2 puffs into the lungs every 4 (four) hours as needed for wheezing. Or coughing spells.  You may use 2 Puffs 5-10 minutes before exercise. 1 Inhaler 3  . Alpha-D-Galactosidase (BEANO PO) Take 1 tablet by mouth as needed (if eating onion or garlic).     . Ascorbic Acid (VITAMIN C) 1000 MG tablet Take 1,000 mg by mouth daily.    Marland Kitchen BREO ELLIPTA 100-25 MCG/INH AEPB INHALE 1 PUFF INTO THE LUNGS DAILY. 180 each 1  . chlorpheniramine (CHLOR-TRIMETON) 4 MG tablet Take 4 mg by mouth daily as needed for allergies.     Marland Kitchen dicyclomine (BENTYL) 10 MG capsule Take 10 mg by mouth daily. Reported on 01/06/2016    . fluticasone (FLONASE) 50 MCG/ACT nasal spray Place 2 sprays into both nostrils daily. 48 g 0  . metFORMIN (GLUCOPHAGE-XR) 500 MG 24 hr tablet Take 500 mg by mouth daily.    . metoprolol succinate (TOPROL-XL) 50 MG 24 hr tablet TAKE 1 TABLET BY MOUTH EVERY DAY IMMEDIATELY FOLLOWING A MEAL. (Patient taking differently: Take 25 mg by mouth daily. TAKE 1 TABLET BY MOUTH EVERY DAY IMMEDIATELY FOLLOWING A MEAL.) 30 tablet 0  . montelukast (SINGULAIR) 10 MG tablet Take 1 tablet (10 mg total) by mouth daily. 30 tablet 0  . Multiple Vitamin (MULTIVITAMIN) tablet Take 1 tablet by mouth daily.    . Naproxen Sodium (ALEVE) 220 MG CAPS Take 1-2 capsules by mouth as needed.    Marland Kitchen omeprazole (PRILOSEC) 20 MG  capsule Take 1 capsule (20 mg total) by mouth daily. 30 capsule 5  . pravastatin (PRAVACHOL) 40 MG tablet Take 40 mg by mouth daily.    . predniSONE (DELTASONE) 10 MG tablet TAKE 1 TABLET (10 MG TOTAL) BY MOUTH DAILY WITH BREAKFAST. (Patient taking differently: Take 5 mg by mouth daily with breakfast. ) 30 tablet 1  . Probiotic Product (PROBIOTIC ADVANCED PO) Take 2 capsules by mouth daily.     . Turmeric 500 MG CAPS Take 1 capsule by mouth daily.    . Calcium Carb-Cholecalciferol (CALCIUM 1000 + D PO) Take 1 tablet by mouth daily.     No facility-administered medications prior to visit.     Allergies  Allergen  Reactions  . Atorvastatin Other (See Comments)    Leg pain  . Betadine [Povidone Iodine] Other (See Comments)    blisters  . Codeine Nausea And Vomiting  . Rosuvastatin Other (See Comments)    Leg pain  . Shellfish Allergy Other (See Comments)    vomiting  . Sulfonamide Derivatives Other (See Comments)    headaches    Family History  Problem Relation Age of Onset  . Emphysema Maternal Grandmother   . Asthma Maternal Grandmother   . Asthma Mother   . Osteoarthritis Mother   . Dementia Mother 48  . Lymphoma Father   . Diabetes Father   . Hypertension Sister   . Heart disease Sister   . Kidney disease Sister   . Lung disease Maternal Grandfather   . Bone cancer Paternal Grandfather   . Allergic rhinitis Daughter   . Breast cancer Paternal Aunt   . Ovarian cancer Maternal Aunt   . Breast cancer Cousin   . Colon cancer Neg Hx   . Angioedema Neg Hx   . Eczema Neg Hx   . Immunodeficiency Neg Hx   . Urticaria Neg Hx     Social History  Substance Use Topics  . Smoking status: Never Smoker  . Smokeless tobacco: Never Used     Comment: pt states she experimented in college  . Alcohol use 1.2 oz/week    1 Glasses of wine, 1 Standard drinks or equivalent per week     Comment: once a wk. 1-2 glasses    ROS: As per history of present illness, otherwise  negative  BP 122/70 (BP Location: Left Arm, Patient Position: Sitting, Cuff Size: Normal)   Pulse 76   Ht 4' 11.75" (1.518 m) Comment: height measured without shoes  Wt 143 lb 8 oz (65.1 kg)   BMI 28.26 kg/m  Constitutional: Well-developed and well-nourished. No distress. HEENT: Normocephalic and atraumatic. Oropharynx is clear and moist. Conjunctivae are normal.  No scleral icterus. Neck: Neck supple. Trachea midline. Cardiovascular: Normal rate, regular rhythm and intact distal pulses. No M/R/G Pulmonary/chest: Effort normal and breath sounds normal. No wheezing, positive end expiratory crackles Abdominal: Soft, nontender, nondistended. Bowel sounds active throughout. There are no masses palpable. No hepatosplenomegaly. Extremities: no clubbing, cyanosis, or edema Neurological: Alert and oriented to person place and time. Skin: Skin is warm and dry.  Psychiatric: Normal mood and affect. Behavior is normal.  ASSESSMENT/PLAN: 66 year old female with a past medical history of ILD, hypersensitivity pneumonitis, GERD, history of hiatal hernia, IBS, possible mild pulmonary hypertension who is seen in consultation at the request of Dr. Chase Caller to evaluate reflux particularly in relation to her interstitial lung disease.  1. GERD, possible LPR/hx of ILD -- we discussed how uncontrolled reflux is felt to contribute to interstitial lung disease and perhaps the progression of this disease process. Clinically she is not having significant heartburn and certainly no evidence of uncontrolled reflux. We also talked about antireflux surgery such as Nissen fundoplication and how this is often done prior to lung transplant. Certainly, I do not feel that she needs Nissen fundoplication at present but it is very reasonable to further evaluate to ensure reflux is maximally medically managed. With this in mind we will proceed to 24-hour pH and impedance testing. We discussed how this would tell us how adequately  her reflux is controlled and also if there is symptom correlation between cough and reflux events. She will continue omeprazole 10 mg on a daily basis including during the  study period. --She has had prior EGD and I do not think this needs to be repeated at present  2. Colon cancer screening -- surveillance colonoscopy would be due July 2024       QK:[id number], Maebelle Munroe, [place] [place], [place] [address]

## 2017-08-26 ENCOUNTER — Telehealth (HOSPITAL_COMMUNITY): Payer: Self-pay | Admitting: *Deleted

## 2017-08-26 NOTE — Telephone Encounter (Signed)
Left message on voicemail in reference to upcoming appointment scheduled for 08/31/17. Phone number given for a call back so details instructions can be given.  Kirstie Peri

## 2017-08-27 ENCOUNTER — Encounter: Payer: Self-pay | Admitting: Cardiology

## 2017-08-27 ENCOUNTER — Telehealth (HOSPITAL_COMMUNITY): Payer: Self-pay

## 2017-08-27 NOTE — Telephone Encounter (Signed)
Patient given detailed instructions per Myocardial Perfusion Study Information Sheet for the test on 08/31/17 at 0945. Patient notified to arrive 15 minutes early and that it is imperative to arrive on time for appointment to keep from having the test rescheduled.  If you need to cancel or reschedule your appointment, please call the office within 24 hours of your appointment. . Patient verbalized understanding. Janifer Adie, CNMT, RT-N

## 2017-08-31 ENCOUNTER — Other Ambulatory Visit: Payer: Self-pay

## 2017-08-31 ENCOUNTER — Ambulatory Visit (HOSPITAL_COMMUNITY): Payer: Medicare Other | Attending: Internal Medicine

## 2017-08-31 ENCOUNTER — Ambulatory Visit (HOSPITAL_BASED_OUTPATIENT_CLINIC_OR_DEPARTMENT_OTHER): Payer: Medicare Other

## 2017-08-31 ENCOUNTER — Encounter (HOSPITAL_COMMUNITY): Payer: Medicare Other

## 2017-08-31 DIAGNOSIS — E785 Hyperlipidemia, unspecified: Secondary | ICD-10-CM | POA: Insufficient documentation

## 2017-08-31 DIAGNOSIS — I2510 Atherosclerotic heart disease of native coronary artery without angina pectoris: Secondary | ICD-10-CM

## 2017-08-31 DIAGNOSIS — I272 Pulmonary hypertension, unspecified: Secondary | ICD-10-CM

## 2017-08-31 DIAGNOSIS — I341 Nonrheumatic mitral (valve) prolapse: Secondary | ICD-10-CM | POA: Diagnosis not present

## 2017-08-31 DIAGNOSIS — I493 Ventricular premature depolarization: Secondary | ICD-10-CM | POA: Diagnosis not present

## 2017-08-31 LAB — ECHOCARDIOGRAM COMPLETE
HEIGHTINCHES: 60 in
WEIGHTICAEL: 2320 [oz_av]

## 2017-08-31 LAB — MYOCARDIAL PERFUSION IMAGING
CHL CUP NUCLEAR SDS: 0
CHL CUP NUCLEAR SRS: 14
CHL CUP NUCLEAR SSS: 11
CHL CUP RESTING HR STRESS: 57 {beats}/min
LHR: 0.42
LV dias vol: 54 mL (ref 46–106)
LV sys vol: 13 mL
NUC STRESS TID: 1.05
Peak HR: 100 {beats}/min

## 2017-08-31 MED ORDER — TECHNETIUM TC 99M TETROFOSMIN IV KIT
33.0000 | PACK | Freq: Once | INTRAVENOUS | Status: AC | PRN
  Administered 2017-08-31: 33 via INTRAVENOUS
  Filled 2017-08-31: qty 33

## 2017-08-31 MED ORDER — REGADENOSON 0.4 MG/5ML IV SOLN
0.4000 mg | Freq: Once | INTRAVENOUS | Status: AC
  Administered 2017-08-31: 0.4 mg via INTRAVENOUS

## 2017-08-31 MED ORDER — TECHNETIUM TC 99M TETROFOSMIN IV KIT
10.3000 | PACK | Freq: Once | INTRAVENOUS | Status: AC | PRN
  Administered 2017-08-31: 10.3 via INTRAVENOUS
  Filled 2017-08-31: qty 11

## 2017-09-03 ENCOUNTER — Encounter (HOSPITAL_COMMUNITY)
Admission: RE | Admit: 2017-09-03 | Discharge: 2017-09-03 | Disposition: A | Discharge status: Home / Self Care | Payer: Medicare Other | Source: Ambulatory Visit | Attending: Internal Medicine | Admitting: Internal Medicine

## 2017-09-03 ENCOUNTER — Encounter (HOSPITAL_COMMUNITY): Payer: Self-pay

## 2017-09-03 VITALS — BP 131/69 | HR 69 | Resp 18 | Ht 59.25 in | Wt 142.6 lb

## 2017-09-03 DIAGNOSIS — J849 Interstitial pulmonary disease, unspecified: Secondary | ICD-10-CM | POA: Diagnosis not present

## 2017-09-03 NOTE — Progress Notes (Signed)
Rhonda [name] 66 y.o. female Pulmonary Rehab Orientation Note Patient arrived today in Cardiac and Pulmonary Rehab for orientation to Pulmonary Rehab. She ambulated from [employer] without difficulty. She does carry portable oxygen but does not use unless with extreme exertion or high elevation. Color good, skin warm and dry. Patient is oriented to time and place. Patient's medical history, psychosocial health, and medications reviewed. Psychosocial assessment reveals pt lives with their spouse. Pt is currently retired. Pt hobbies include gardening, reading, and playing cards. Pt reports her stress level is low. Areas of stress/anxiety include Health.  Pt does not signs of depression. PHQ2/9 score 0/na. Pt shows good  coping skills with positive outlook. She was offered emotional support and reassurance. Will continue to monitor and evaluate progress toward psychosocial goal(s) of remaining positive regarding her prognosis for ILD. Physical assessment reveals heart rate is normal, breath sounds clear to auscultation, no wheezes, rales, or rhonchi. Grip strength equal, strong. Distal pulses palpable. Patient reports she does take medications as prescribed. Patient states she follows a Regular diet. The patient reports no specific efforts to gain or lose weight. Patient's weight will be monitored closely. Demonstration and practice of PLB using pulse oximeter. Patient able to return demonstration satisfactorily. Safety and hand hygiene in the exercise area reviewed with patient. Patient voices understanding of the information reviewed. Department expectations discussed with patient and achievable goals were set. The patient shows enthusiasm about attending the program and we look forward to working with this nice lady. The patient is scheduled for a 6 min walk test on 09/09/17 and to begin exercise on 09/28/17.   45 minutes was spent on a variety of activities such as assessment of the patient, obtaining  baseline data including height, weight, BMI, and grip strength, verifying medical history, allergies, and current medications, and teaching patient strategies for performing tasks with less respiratory effort with emphasis on pursed lip breathing.

## 2017-09-07 ENCOUNTER — Telehealth: Payer: Self-pay | Admitting: Internal Medicine

## 2017-09-07 ENCOUNTER — Ambulatory Visit (INDEPENDENT_AMBULATORY_CARE_PROVIDER_SITE_OTHER): Payer: Medicare Other | Admitting: Pharmacist

## 2017-09-07 DIAGNOSIS — I2510 Atherosclerotic heart disease of native coronary artery without angina pectoris: Secondary | ICD-10-CM

## 2017-09-07 DIAGNOSIS — E782 Mixed hyperlipidemia: Secondary | ICD-10-CM

## 2017-09-07 MED ORDER — EZETIMIBE 10 MG PO TABS: 10.0000 mg | ORAL_TABLET | Freq: Every day | ORAL | 11 refills | Status: DC

## 2017-09-07 NOTE — Telephone Encounter (Signed)
Pt scheduled for 24 hour ph probe test tomorrow at [facility]. Pt states she has a sore throat and wants to cancel the appt, appt cancelled. Pt states she will call back to reschedule the appt.

## 2017-09-07 NOTE — Patient Instructions (Addendum)
Continue taking your pravastatin 40mg  - move this to night time dosing  You can start taking fish oil 1,000mg  daily to help with your triglycerides  Start taking Zetia (ezetimibe) each day. You can start with the 1/2 tablet (5mg ) for the first few weeks to see how you tolerate it. If you are feeling well, increase your dose to the full 10mg  daily  Recheck your cholesterol on Monday, December 17th. Come in for fasting labs any time after 7:30am

## 2017-09-07 NOTE — Progress Notes (Signed)
Patient ID: Rhonda [name]                 DOB: 09/29/1951                    MRN: [id number]     HPI: Rhonda [name] is a 66 y.o. female patient referred to lipid clinic by Dr Radford Pax. PMH is significant for coronary artery calcifications seen on CAT scan, HLD, GERD, ILD, and PVCs. She underwent a stress test on 08/31/17 which came back normal. She underwent calcium scoring on 08/20/17 which revealed dense 3 vessel coronary calcifications with calcium score of 343. This was 90th percentile when matched for age and sex. Pt has a history of statin intolerance and presents to lipid clinic for further management.  Pt presents today in good spirits. She has tried Crestor and Lipitor in the past many years ago and reports bilateral myalgias in her legs within 1 week of starting each statin. Her leg pain disappeared within 1 week of statin discontinuation in each case. She has simvastatin listed on her profile history although she does not recall trying this. Most recently, she has been taking pravastatin. In the past, she reduced her dose from 52m to 252mdaily because she was experiencing memory issues with word retrieval in particular (pt is a speech and [place] She felt better on the 2022maily and her lipid panel drawn in September reflects pt on the 52m19mse. Once she heard the results of her calcium score, she increased her dose back to 40mg9mly and is feeling well overall, although she still reports some mental fogginess. She is taking her pravastatin in the morning.  Current Medications: pravastatin 40mg 51my Intolerances: Lipitor, Crestor, pravastatin 52mg a51m0mg da79m- memory issues, simvastatin 52mg dai64misk Factors: CAD and elevated calcium score of 343 LDL goal: <70mg/dL  53m: Plant based diet. Lots of fish and eggs. No alcohol, limits sugar.  Exercise: Swims, takes Tai Chi classes, and is restarting pulmonary rehab soon.   Family History: Diabetes in her father;  Heart disease in her sister; Hypertension in her sister; Kidney disease in her sister.  Social History: Denies tobacco and illicit drugs. Occasionally drinks wine.  Labs: 08/02/17: TC 222, TG 237, HDL 62, LDL 113, non-HDL 160 (pravastatin 52mg daily17mast Medical History:  Diagnosis Date  . Allergy   . Arthritis    history spinal stenosis. osteoarthritis right hip  . Asthma   . Cataract   . Coronary artery calcification seen on CAT scan 08/19/2017   >300 on CT scan 08/2017  . Depression   . DOE (dyspnea on exertion)    a. 04/2010 Lexi MV EF 71%, no ischemia/infarct;    . GERD (gastroesophageal reflux disease)   . H/O steroid therapy    Steroid use orally over 4 yrs- for Lung Fibrosis  . Heart palpitations 02/28/2015  . Helicobacter pylori ab+   . Hiatal hernia   . High cholesterol   . History of chronic bronchitis    as child  . Hyperlipidemia, mixed 08/19/2017  . Hyperplastic colon polyp 2007  . Inguinal hernia    right  . Insulin resistance    past  . Interstitial lung disease (HCC)   . MV[place]ubekamitral valve prolapse)    Posterior mitral valve leaflet with mild MR  . Pneumonitis, hypersensitivity (HCC)    a. [place]2013 s/p Bx - ? 2/2 bird, mold, oil paint exposure ->on steroids, followed by  pulm.  . Pulmonary fibrosis (HCC)    Dr. Chase Caller follows- stable at present  . Pulmonary HTN ([place])    mild PASP 38mHg on echo 02/2015  . PVC (premature ventricular contraction) 08/19/2017  . Rapid heart rate    Dr. TRadford Paxfollows- last visit Epic note 9'16    Current Outpatient Prescriptions on File Prior to Visit  Medication Sig Dispense Refill  . acetaminophen (TYLENOL) 325 MG tablet Take 650 mg by mouth every 6 (six) hours as needed. Reported on 02/10/2016    . albuterol (PROAIR HFA) 108 (90 Base) MCG/ACT inhaler Inhale 2 puffs into the lungs every 4 (four) hours as needed for wheezing. Or coughing spells.  You may use 2 Puffs 5-10 minutes before exercise. 1 Inhaler 3  .  Alpha-D-Galactosidase (BEANO PO) Take 1 tablet by mouth as needed (if eating onion or garlic).     . AMBULATORY NON FORMULARY MEDICATION Cinnsulin cinnamon caplsule 1 by mouth daily    . Ascorbic Acid (VITAMIN C) 1000 MG tablet Take 1,000 mg by mouth daily.    .Marland KitchenBREO ELLIPTA 100-25 MCG/INH AEPB INHALE 1 PUFF INTO THE LUNGS DAILY. 180 each 1  . Calcium Citrate-Vitamin D (CALCITRATE PLUS D PO) Take 1 tablet by mouth daily.    . Calcium-Vitamin D-Vitamin K (VIACTIV) 5334-356-86MG-UNT-MCG CHEW Chew 1 tablet by mouth daily.    . chlorpheniramine (CHLOR-TRIMETON) 4 MG tablet Take 4 mg by mouth daily as needed for allergies.     .Marland Kitchendicyclomine (BENTYL) 10 MG capsule Take 10 mg by mouth daily. Reported on 01/06/2016    . fluticasone (FLONASE) 50 MCG/ACT nasal spray Place 2 sprays into both nostrils daily. 48 g 0  . metFORMIN (GLUCOPHAGE-XR) 500 MG 24 hr tablet Take 500 mg by mouth daily.    . metoprolol succinate (TOPROL-XL) 50 MG 24 hr tablet TAKE 1 TABLET BY MOUTH EVERY DAY IMMEDIATELY FOLLOWING A MEAL. (Patient taking differently: Take 25 mg by mouth daily. TAKE 1 TABLET BY MOUTH EVERY DAY IMMEDIATELY FOLLOWING A MEAL.) 30 tablet 0  . montelukast (SINGULAIR) 10 MG tablet Take 1 tablet (10 mg total) by mouth daily. 30 tablet 0  . Multiple Vitamin (MULTIVITAMIN) tablet Take 1 tablet by mouth daily.    . Naproxen Sodium (ALEVE) 220 MG CAPS Take 1-2 capsules by mouth as needed.    .Marland Kitchenomeprazole (PRILOSEC) 20 MG capsule Take 1 capsule (20 mg total) by mouth daily. (Patient taking differently: Take 20 mg by mouth daily. Patient takes 10 mg) 30 capsule 5  . pravastatin (PRAVACHOL) 40 MG tablet Take 40 mg by mouth daily.    . predniSONE (DELTASONE) 10 MG tablet TAKE 1 TABLET (10 MG TOTAL) BY MOUTH DAILY WITH BREAKFAST. (Patient taking differently: Take 5 mg by mouth daily with breakfast. ) 30 tablet 1  . Probiotic Product (PROBIOTIC ADVANCED PO) Take 1 capsule by mouth daily.     . Turmeric 500 MG CAPS Take 1  capsule by mouth daily.     No current facility-administered medications on file prior to visit.     Allergies  Allergen Reactions  . Atorvastatin Other (See Comments)    Leg pain  . Betadine [Povidone Iodine] Other (See Comments)    blisters  . Codeine Nausea And Vomiting  . Rosuvastatin Other (See Comments)    Leg pain  . Shellfish Allergy Other (See Comments)    vomiting  . Sulfonamide Derivatives Other (See Comments)    headaches    Assessment/Plan:  1.  Hyperlipidemia - Given elevated calcium score which revealed dense 3 vessel coronary calcifications, will treat cholesterol aggressively and target LDL < 70. Most recent lipid panel showing LDL of 113 reflects pt taking pravastatin 48m. She has since increased her dose to 414mdaily. Since she is tolerating the 4060mose well, will continue this and move dosing to night time for optimal efficacy. Will also start Zetia - 5mg57mily for 2 weeks then increase to 10mg56mly (pt concerned for GI related symptoms due to hx of IBS). Will also start fish oil 1g daily to help lower TG. Will recheck lipids in 2 months. Advised pt to call clinic if her fogginess does not improve or if she wants to try a different statin - would start Livalo 2mg d35my at that time.   Will forward note to PCP per patient request: Karen Horald Pollen #[phone] E. Katelynne Revak, PharmD, CPP, BCACP [id number]: [phone] 3248/2018 10:33 AM

## 2017-09-08 ENCOUNTER — Encounter (HOSPITAL_COMMUNITY): Admission: RE | Payer: Self-pay | Source: Ambulatory Visit

## 2017-09-08 ENCOUNTER — Ambulatory Visit (HOSPITAL_COMMUNITY): Admission: RE | Admit: 2017-09-08 | Payer: Medicare Other | Source: Ambulatory Visit | Admitting: Internal Medicine

## 2017-09-08 SURGERY — MONITORING, ESOPHAGEAL PH, 24 HOUR

## 2017-09-09 ENCOUNTER — Encounter (HOSPITAL_COMMUNITY)
Admission: RE | Admit: 2017-09-09 | Discharge: 2017-09-09 | Disposition: A | Discharge status: Home / Self Care | Payer: Medicare Other | Source: Ambulatory Visit | Attending: Internal Medicine | Admitting: Internal Medicine

## 2017-09-09 DIAGNOSIS — J849 Interstitial pulmonary disease, unspecified: Secondary | ICD-10-CM

## 2017-09-09 NOTE — Progress Notes (Signed)
Pulmonary Individual Treatment Plan  Patient Details  Name: Rhonda [name] MRN: [id number] Date of Birth: 09-03-1951 Referring Provider:     Pulmonary Rehab Walk Test from 09/09/2017 in [place]  Referring Provider  Dr. Chase Caller      Initial Encounter Date:    Pulmonary Rehab Walk Test from 09/09/2017 in [place]  Date  09/09/17  Referring Provider  Dr. Chase Caller      Visit Diagnosis: ILD (interstitial lung disease) ([place])  Patient's Home Medications on Admission:   Current Outpatient Prescriptions:  .  acetaminophen (TYLENOL) 325 MG tablet, Take 650 mg by mouth every 6 (six) hours as needed. Reported on 02/10/2016, Disp: , Rfl:  .  albuterol (PROAIR HFA) 108 (90 Base) MCG/ACT inhaler, Inhale 2 puffs into the lungs every 4 (four) hours as needed for wheezing. Or coughing spells.  You may use 2 Puffs 5-10 minutes before exercise., Disp: 1 Inhaler, Rfl: 3 .  Alpha-D-Galactosidase (BEANO PO), Take 1 tablet by mouth as needed (if eating onion or garlic). , Disp: , Rfl:  .  AMBULATORY NON FORMULARY MEDICATION, Cinnsulin cinnamon caplsule 1 by mouth daily, Disp: , Rfl:  .  Ascorbic Acid (VITAMIN C) 1000 MG tablet, Take 1,000 mg by mouth daily., Disp: , Rfl:  .  BREO ELLIPTA 100-25 MCG/INH AEPB, INHALE 1 PUFF INTO THE LUNGS DAILY., Disp: 180 each, Rfl: 1 .  Calcium Citrate-Vitamin D (CALCITRATE PLUS D PO), Take 1 tablet by mouth daily., Disp: , Rfl:  .  Calcium-Vitamin D-Vitamin K (VIACTIV) [phone] MG-UNT-MCG CHEW, Chew 1 tablet by mouth daily., Disp: , Rfl:  .  chlorpheniramine (CHLOR-TRIMETON) 4 MG tablet, Take 4 mg by mouth daily as needed for allergies. , Disp: , Rfl:  .  dicyclomine (BENTYL) 10 MG capsule, Take 10 mg by mouth daily. Reported on 01/06/2016, Disp: , Rfl:  .  ezetimibe (ZETIA) 10 MG tablet, Take 1 tablet (10 mg total) by mouth daily., Disp: 30 tablet, Rfl: 11 .  fluticasone (FLONASE) 50 MCG/ACT nasal  spray, Place 2 sprays into both nostrils daily., Disp: 48 g, Rfl: 0 .  metFORMIN (GLUCOPHAGE-XR) 500 MG 24 hr tablet, Take 500 mg by mouth daily., Disp: , Rfl:  .  metoprolol succinate (TOPROL-XL) 50 MG 24 hr tablet, TAKE 1 TABLET BY MOUTH EVERY DAY IMMEDIATELY FOLLOWING A MEAL. (Patient taking differently: Take 25 mg by mouth daily. TAKE 1 TABLET BY MOUTH EVERY DAY IMMEDIATELY FOLLOWING A MEAL.), Disp: 30 tablet, Rfl: 0 .  montelukast (SINGULAIR) 10 MG tablet, Take 1 tablet (10 mg total) by mouth daily., Disp: 30 tablet, Rfl: 0 .  Multiple Vitamin (MULTIVITAMIN) tablet, Take 1 tablet by mouth daily., Disp: , Rfl:  .  Naproxen Sodium (ALEVE) 220 MG CAPS, Take 1-2 capsules by mouth as needed., Disp: , Rfl:  .  Omega-3 Fatty Acids (FISH OIL) 1000 MG CAPS, Take 1,000 mg by mouth daily., Disp: , Rfl:  .  omeprazole (PRILOSEC) 20 MG capsule, Take 1 capsule (20 mg total) by mouth daily. (Patient taking differently: Take 20 mg by mouth daily. Patient takes 10 mg), Disp: 30 capsule, Rfl: 5 .  pravastatin (PRAVACHOL) 40 MG tablet, Take 40 mg by mouth daily., Disp: , Rfl:  .  predniSONE (DELTASONE) 5 MG tablet, Take 5 mg by mouth daily with breakfast., Disp: , Rfl:  .  Probiotic Product (PROBIOTIC ADVANCED PO), Take 1 capsule by mouth daily. , Disp: , Rfl:  .  Turmeric 500  MG CAPS, Take 1 capsule by mouth daily., Disp: , Rfl:   Past Medical History: Past Medical History:  Diagnosis Date  . Allergy   . Arthritis    history spinal stenosis. osteoarthritis right hip  . Asthma   . Cataract   . Coronary artery calcification seen on CAT scan 08/19/2017   >300 on CT scan 08/2017  . Depression   . DOE (dyspnea on exertion)    a. 04/2010 Lexi MV EF 71%, no ischemia/infarct;    . GERD (gastroesophageal reflux disease)   . H/O steroid therapy    Steroid use orally over 4 yrs- for Lung Fibrosis  . Heart palpitations 02/28/2015  . Helicobacter pylori ab+   . Hiatal hernia   . High cholesterol   . History of  chronic bronchitis    as child  . Hyperlipidemia, mixed 08/19/2017  . Hyperplastic colon polyp 2007  . Inguinal hernia    right  . Insulin resistance    past  . Interstitial lung disease ([place])   . MVP (mitral valve prolapse)    Posterior mitral valve leaflet with mild MR  . Pneumonitis, hypersensitivity ([place])    a. 09/2012 s/p Bx - ? 2/2 bird, mold, oil paint exposure ->on steroids, followed by pulm.  . Pulmonary fibrosis ([facility])    Dr. Chase Caller follows- stable at present  . Pulmonary HTN ([place])    mild PASP 32mmHg on echo 02/2015  . PVC (premature ventricular contraction) 08/19/2017  . Rapid heart rate    Dr. Radford Pax follows- last visit Epic note 9'16    Tobacco Use: History  Smoking Status  . Never Smoker  Smokeless Tobacco  . Never Used    Comment: pt states she experimented in college    Labs: Recent Review Flowsheet Data    Labs for ITP Cardiac and Pulmonary Rehab Latest Ref Rng & Units 02/08/2014 10/12/2014 01/22/2015 02/26/2015 08/21/2015   Cholestrol 0 - 200 mg/dL 210(H) 176 - 184 -   LDLCALC 0 - 99 mg/dL 113(H) 79 - 94 -   HDL >=46 mg/dL 53 50 - 53 -   Trlycerides <150 mg/dL 222(H) 236(H) - 185(H) -   Hemoglobin A1c <5.7 % 5.8 - 5.9 - 6.1(H)   PHART 7.350 - 7.450 - - - - -   PCO2ART 35.0 - 45.0 mmHg - - - - -   HCO3 20.0 - 24.0 mEq/L - - - - -   TCO2 0 - 100 mmol/L - - - - -   ACIDBASEDEF 0.0 - 2.0 mmol/L - - - - -   O2SAT % - - - - -      Capillary Blood Glucose: Lab Results  Component Value Date   GLUCAP 110 (H) 10/01/2012     Pulmonary Assessment Scores:     Pulmonary Assessment Scores    Row Name 09/08/17 0944 09/09/17 1632       ADL UCSD   ADL Phase Entry  -    SOB Score total 21  -      CAT Score   CAT Score 13  Entry  -      mMRC Score   mMRC Score  - 1       Pulmonary Function Assessment:     Pulmonary Function Assessment - 09/03/17 1158      Breath   Bilateral Breath Sounds Clear   Shortness of Breath Yes;Limiting activity   extreme exertion or high elevations      Exercise Target Goals:  Date: 09/09/17  Exercise Program Goal: Individual exercise prescription set with THRR, safety & activity barriers. Participant demonstrates ability to understand and report RPE using BORG scale, to self-measure pulse accurately, and to acknowledge the importance of the exercise prescription.  Exercise Prescription Goal: Starting with aerobic activity 30 plus minutes a day, 3 days per week for initial exercise prescription. Provide home exercise prescription and guidelines that participant acknowledges understanding prior to discharge.  Activity Barriers & Risk Stratification:     Activity Barriers & Cardiac Risk Stratification - 09/03/17 1138      Activity Barriers & Cardiac Risk Stratification   Activity Barriers Right Hip Replacement;Shortness of Breath      6 Minute Walk:     6 Minute Walk    Row Name 09/09/17 1616         6 Minute Walk   Phase Initial     Distance 1200 feet     Walk Time 6 minutes     # of Rest Breaks 0     MPH 2.27     METS 3.07     RPE 10     Perceived Dyspnea  1     Symptoms No     Resting HR 87 bpm     Resting BP 130/78     Resting Oxygen Saturation  95 %     Exercise Oxygen Saturation  during 6 min walk 88 %     Max Ex. HR 109 bpm     Max Ex. BP 148/79       Interval HR   1 Minute HR 87     2 Minute HR 100     3 Minute HR 105     4 Minute HR 109     5 Minute HR 105     6 Minute HR 105     2 Minute Post HR 88     Interval Heart Rate? Yes       Interval Oxygen   Interval Oxygen? Yes     Baseline Oxygen Saturation % 95 %     1 Minute Oxygen Saturation % 93 %     1 Minute Liters of Oxygen 0 L     2 Minute Oxygen Saturation % 90 %     2 Minute Liters of Oxygen 0 L     3 Minute Oxygen Saturation % 89 %     3 Minute Liters of Oxygen 0 L     4 Minute Oxygen Saturation % 88 %     4 Minute Liters of Oxygen 0 L     5 Minute Oxygen Saturation % 89 %     5 Minute Liters  of Oxygen 0 L     6 Minute Oxygen Saturation % 90 %     6 Minute Liters of Oxygen 0 L     2 Minute Post Oxygen Saturation % 92 %     2 Minute Post Liters of Oxygen 0 L        Oxygen Initial Assessment:     Oxygen Initial Assessment - 09/09/17 1631      Initial 6 min Walk   Oxygen Used None     Program Oxygen Prescription   Program Oxygen Prescription None      Oxygen Re-Evaluation:   Oxygen Discharge (Final Oxygen Re-Evaluation):   Initial Exercise Prescription:     Initial Exercise Prescription - 09/09/17 1600      Date of Initial Exercise RX and  Referring Provider   Date 09/09/17   Referring Provider Dr. Hilarie Fredrickson   Level 0.4   Minutes 17     Rower   Level 1   Minutes 17     Track   Laps 10   Minutes 17     Prescription Details   Frequency (times per week) 2   Duration Progress to 45 minutes of aerobic exercise without signs/symptoms of physical distress     Intensity   THRR 40-80% of Max Heartrate 61-122   Ratings of Perceived Exertion 11-13   Perceived Dyspnea 0-4     Progression   Progression Continue progressive overload as per policy without signs/symptoms or physical distress.     Resistance Training   Training Prescription Yes   Weight orange bands   Reps 10-15      Perform Capillary Blood Glucose checks as needed.  Exercise Prescription Changes:   Exercise Comments:   Exercise Goals and Review:     Exercise Goals    Row Name 09/03/17 1143             Exercise Goals   Increase Physical Activity Yes       Intervention Provide advice, education, support and counseling about physical activity/exercise needs.;Develop an individualized exercise prescription for aerobic and resistive training based on initial evaluation findings, risk stratification, comorbidities and participant's personal goals.       Expected Outcomes Achievement of increased cardiorespiratory fitness and enhanced flexibility, muscular endurance  and strength shown through measurements of functional capacity and personal statement of participant.       Increase Strength and Stamina Yes       Intervention Provide advice, education, support and counseling about physical activity/exercise needs.;Develop an individualized exercise prescription for aerobic and resistive training based on initial evaluation findings, risk stratification, comorbidities and participant's personal goals.       Expected Outcomes Achievement of increased cardiorespiratory fitness and enhanced flexibility, muscular endurance and strength shown through measurements of functional capacity and personal statement of participant.       Able to understand and use rate of perceived exertion (RPE) scale Yes       Intervention Provide education and explanation on how to use RPE scale       Expected Outcomes Short Term: Able to use RPE daily in rehab to express subjective intensity level;Long Term:  Able to use RPE to guide intensity level when exercising independently       Able to understand and use Dyspnea scale Yes       Intervention Provide education and explanation on how to use Dyspnea scale       Expected Outcomes Short Term: Able to use Dyspnea scale daily in rehab to express subjective sense of shortness of breath during exertion;Long Term: Able to use Dyspnea scale to guide intensity level when exercising independently       Knowledge and understanding of Target Heart Rate Range (THRR) Yes       Intervention Provide education and explanation of THRR including how the numbers were predicted and where they are located for reference       Expected Outcomes Short Term: Able to state/look up THRR;Long Term: Able to use THRR to govern intensity when exercising independently;Short Term: Able to use daily as guideline for intensity in rehab       Understanding of Exercise Prescription Yes       Intervention Provide education, explanation, and written materials on patient's  individual exercise prescription       Expected Outcomes Short Term: Able to explain program exercise prescription;Long Term: Able to explain home exercise prescription to exercise independently          Exercise Goals Re-Evaluation :   Discharge Exercise Prescription (Final Exercise Prescription Changes):   Nutrition:  Target Goals: Understanding of nutrition guidelines, daily intake of sodium 1500mg , cholesterol 200mg , calories 30% from fat and 7% or less from saturated fats, daily to have 5 or more servings of fruits and vegetables.  Biometrics:     Pre Biometrics - 09/03/17 1144      Pre Biometrics   Grip Strength 16 kg       Nutrition Therapy Plan and Nutrition Goals:   Nutrition Discharge: Rate Your Plate Scores:   Nutrition Goals Re-Evaluation:   Nutrition Goals Discharge (Final Nutrition Goals Re-Evaluation):   Psychosocial: Target Goals: Acknowledge presence or absence of significant depression and/or stress, maximize coping skills, provide positive support system. Participant is able to verbalize types and ability to use techniques and skills needed for reducing stress and depression.  Initial Review & Psychosocial Screening:     Initial Psych Review & Screening - 09/03/17 1200      Initial Review   Current issues with None Identified     Family Dynamics   Good Support System? Yes     Barriers   Psychosocial barriers to participate in program There are no identifiable barriers or psychosocial needs.      Quality of Life Scores:   PHQ-9: Recent Review Flowsheet Data    Depression screen [facility] 2/9 09/03/2017 08/21/2015 03/04/2015 01/22/2015 10/12/2014   Decreased Interest 0 0 0 0 0   Down, Depressed, Hopeless 0 0 0 0 0   PHQ - 2 Score 0 0 0 0 0     Interpretation of Total Score  Total Score Depression Severity:  1-4 = Minimal depression, 5-9 = Mild depression, 10-14 = Moderate depression, 15-19 = Moderately severe depression, 20-27 = Severe  depression   Psychosocial Evaluation and Intervention:     Psychosocial Evaluation - 09/03/17 1200      Psychosocial Evaluation & Interventions   Interventions Encouraged to exercise with the program and follow exercise prescription   Expected Outcomes patient will remain free from psychosocial barriers during participation in pulmonary rehab   Continue Psychosocial Services  No Follow up required      Psychosocial Re-Evaluation:   Psychosocial Discharge (Final Psychosocial Re-Evaluation):   Education: Education Goals: Education classes will be provided on a weekly basis, covering required topics. Participant will state understanding/return demonstration of topics presented.  Learning Barriers/Preferences:     Learning Barriers/Preferences - 09/03/17 1158      Learning Barriers/Preferences   Learning Barriers None   Learning Preferences Written Material;Verbal Instruction      Education Topics: Risk Factor Reduction:  -Group instruction that is supported by a PowerPoint presentation. Instructor discusses the definition of a risk factor, different risk factors for pulmonary disease, and how the heart and lungs work together.     Nutrition for Pulmonary Patient:  -Group instruction provided by PowerPoint slides, verbal discussion, and written materials to support subject matter. The instructor gives an explanation and review of healthy diet recommendations, which includes a discussion on weight management, recommendations for fruit and vegetable consumption, as well as protein, fluid, caffeine, fiber, sodium, sugar, and alcohol. Tips for eating when patients are short of breath are discussed.   Pursed Lip Breathing:  -Group instruction  that is supported by demonstration and informational handouts. Instructor discusses the benefits of pursed lip and diaphragmatic breathing and detailed demonstration on how to preform both.     Oxygen Safety:  -Group instruction provided  by PowerPoint, verbal discussion, and written material to support subject matter. There is an overview of "What is Oxygen" and "Why do we need it".  Instructor also reviews how to create a safe environment for oxygen use, the importance of using oxygen as prescribed, and the risks of noncompliance. There is a brief discussion on traveling with oxygen and resources the patient may utilize.   Oxygen Equipment:  -Group instruction provided by [facility] Staff utilizing handouts, written materials, and equipment demonstrations.   Signs and Symptoms:  -Group instruction provided by written material and verbal discussion to support subject matter. Warning signs and symptoms of infection, stroke, and heart attack are reviewed and when to call the physician/911 reinforced. Tips for preventing the spread of infection discussed.   Advanced Directives:  -Group instruction provided by verbal instruction and written material to support subject matter. Instructor reviews Advanced Directive laws and proper instruction for filling out document.   Pulmonary Video:  -Group video education that reviews the importance of medication and oxygen compliance, exercise, good nutrition, pulmonary hygiene, and pursed lip and diaphragmatic breathing for the pulmonary patient.   Exercise for the Pulmonary Patient:  -Group instruction that is supported by a PowerPoint presentation. Instructor discusses benefits of exercise, core components of exercise, frequency, duration, and intensity of an exercise routine, importance of utilizing pulse oximetry during exercise, safety while exercising, and options of places to exercise outside of rehab.     Pulmonary Medications:  -Verbally interactive group education provided by instructor with focus on inhaled medications and proper administration.   Anatomy and Physiology of the Respiratory System and Intimacy:  -Group instruction provided by PowerPoint, verbal discussion, and  written material to support subject matter. Instructor reviews respiratory cycle and anatomical components of the respiratory system and their functions. Instructor also reviews differences in obstructive and restrictive respiratory diseases with examples of each. Intimacy, Sex, and Sexuality differences are reviewed with a discussion on how relationships can change when diagnosed with pulmonary disease. Common sexual concerns are reviewed.   MD DAY -A group question and answer session with a medical doctor that allows participants to ask questions that relate to their pulmonary disease state.   OTHER EDUCATION -Group or individual verbal, written, or video instructions that support the educational goals of the pulmonary rehab program.   Knowledge Questionnaire Score:     Knowledge Questionnaire Score - 09/08/17 0944      Knowledge Questionnaire Score   Pre Score 13/13      Core Components/Risk Factors/Patient Goals at Admission:     Personal Goals and Risk Factors at Admission - 09/03/17 1159      Core Components/Risk Factors/Patient Goals on Admission    Weight Management Weight Loss;Yes   Intervention Weight Management: Develop a combined nutrition and exercise program designed to reach desired caloric intake, while maintaining appropriate intake of nutrient and fiber, sodium and fats, and appropriate energy expenditure required for the weight goal.;Weight Management: Provide education and appropriate resources to help participant work on and attain dietary goals.;Weight Management/Obesity: Establish reasonable short term and long term weight goals.   Expected Outcomes Short Term: Continue to assess and modify interventions until short term weight is achieved;Weight Loss: Understanding of general recommendations for a balanced deficit meal plan, which promotes 1-2 lb  weight loss per week and includes a negative energy balance of [phone] kcal/d;Understanding recommendations for meals  to include 15-35% energy as protein, 25-35% energy from fat, 35-60% energy from carbohydrates, less than 200mg  of dietary cholesterol, 20-35 gm of total fiber daily;Understanding of distribution of calorie intake throughout the day with the consumption of 4-5 meals/snacks   Improve shortness of breath with ADL's Yes   Intervention Provide education, individualized exercise plan and daily activity instruction to help decrease symptoms of SOB with activities of daily living.   Expected Outcomes Short Term: Achieves a reduction of symptoms when performing activities of daily living.      Core Components/Risk Factors/Patient Goals Review:    Core Components/Risk Factors/Patient Goals at Discharge (Final Review):    ITP Comments:   Comments:

## 2017-09-24 ENCOUNTER — Other Ambulatory Visit: Payer: Self-pay | Admitting: Internal Medicine

## 2017-09-27 NOTE — Telephone Encounter (Signed)
Patient just texted me- cough is worse with fall season.   1. She wants to know if ok to bump up to 10mg  prednisone. Ask her if she would rather do that or take a short prednsione burst (Take prednisone 40 mg daily x 2 days, then 20mg  daily x 2 days, then 10mg  daily x 2 days, then 5mg  daily x 2 days and back to baseline dose) ? Either or fine with me  2. Please send her nasal steroid to [facility]  Thanks  Dr. Brand Males, M.D., [facility].C.P Pulmonary and Critical Care Medicine Staff Physician [place] [facility] Pager: [phone], If no answer or between  15:00h - 7:00h: call [phone]  09/27/2017 1:47 PM

## 2017-09-28 ENCOUNTER — Encounter (HOSPITAL_COMMUNITY)
Admission: RE | Admit: 2017-09-28 | Discharge: 2017-09-28 | Disposition: A | Discharge status: Home / Self Care | Payer: Medicare Other | Source: Ambulatory Visit | Attending: Internal Medicine | Admitting: Internal Medicine

## 2017-09-28 VITALS — Wt 143.3 lb

## 2017-09-28 DIAGNOSIS — J849 Interstitial pulmonary disease, unspecified: Secondary | ICD-10-CM | POA: Insufficient documentation

## 2017-09-28 NOTE — Progress Notes (Signed)
Daily Session Note  Patient Details  Name: Rhonda [name] MRN: [id number] Date of Birth: 1951/02/22 Referring Provider:     Pulmonary Rehab Walk Test from 09/09/2017 in [place]  Referring Provider  Dr. Chase Caller      Encounter Date: 09/28/2017  Check In: Session Check In - 09/28/17 1329      Check-In   Location  [facility]    Staff Present  Su Hilt, MS, ACSM RCEP, Exercise Physiologist;Anaja Monts [place] Evert, RN;Joan Leonia Reeves, RN, Luisa Hart, RN, BSN    Supervising physician immediately available to respond to emergencies  [facility] immediately available    Physician(s)  Dr. Broadus [name]    Medication changes reported      No    Fall or balance concerns reported     No    Tobacco Cessation  No Change    Warm-up and Cool-down  Performed as group-led instruction    Resistance Training Performed  Yes    VAD Patient?  No      Pain Assessment   Currently in Pain?  No/denies    Multiple Pain Sites  No       Capillary Blood Glucose: No results found for this or any previous visit (from the past 24 hour(s)).  Exercise Prescription Changes - 09/28/17 1500      Response to Exercise   Blood Pressure (Admit)  104/48    Blood Pressure (Exercise)  146/54    Blood Pressure (Exit)  90/46    Heart Rate (Admit)  91 bpm    Heart Rate (Exercise)  105 bpm    Heart Rate (Exit)  89 bpm    Oxygen Saturation (Admit)  92 %    Oxygen Saturation (Exercise)  90 %    Oxygen Saturation (Exit)  95 %    Rating of Perceived Exertion (Exercise)  15    Perceived Dyspnea (Exercise)  2    Duration  Progress to 45 minutes of aerobic exercise without signs/symptoms of physical distress    Intensity  Other (comment) 40-80% of HRR   40-80% of HRR     Progression   Progression  Continue to progress workloads to maintain intensity without signs/symptoms of physical distress.      Resistance Training   Training Prescription  Yes    Weight   orange bands    Reps  10-15    Time  10 Minutes      Bike   Level  3    Minutes  17      Rower   Level  1    Minutes  17      Track   Laps  15    Minutes  17       Social History   Tobacco Use  Smoking Status Never Smoker  Smokeless Tobacco Never Used  Tobacco Comment   pt states she experimented in college    Goals Met:  Exercise tolerated well No report of cardiac concerns or symptoms Strength training completed today  Goals Unmet:  Not Applicable  Comments: Service time is from 1330 to 1500    Dr. Rush Farmer is Medical Director for Pulmonary Rehab at [facility].

## 2017-09-28 NOTE — Addendum Note (Signed)
Encounter addended by: Deon Pilling, RN on: 09/28/2017 3:24 PM  Actions taken: Visit Navigator Flowsheet section accepted

## 2017-09-29 ENCOUNTER — Other Ambulatory Visit: Payer: Self-pay | Admitting: Internal Medicine

## 2017-09-30 ENCOUNTER — Encounter (HOSPITAL_COMMUNITY)
Admission: RE | Admit: 2017-09-30 | Discharge: 2017-09-30 | Disposition: A | Discharge status: Home / Self Care | Payer: Medicare Other | Source: Ambulatory Visit | Attending: Internal Medicine | Admitting: Internal Medicine

## 2017-09-30 ENCOUNTER — Other Ambulatory Visit: Payer: Self-pay | Admitting: Internal Medicine

## 2017-09-30 VITALS — Wt 142.6 lb

## 2017-09-30 DIAGNOSIS — J849 Interstitial pulmonary disease, unspecified: Secondary | ICD-10-CM

## 2017-09-30 MED ORDER — PREDNISONE 10 MG PO TABS: 10.0000 mg | ORAL_TABLET | Freq: Every day | ORAL | 2 refills | Status: DC

## 2017-09-30 NOTE — Telephone Encounter (Signed)
Received a message stating that pt was wanting a refill of prednisone.  Called pt to see if she was wanting a pred taper or to have it be 10mg  daily.  Spoke with pt and she is wanting 10mg  daily. Will send Rx to pt's pharmacy of choice. Nothing further needed.

## 2017-09-30 NOTE — Progress Notes (Signed)
Daily Session Note  Patient Details  Name: Rhonda [name] MRN: [id number] Date of Birth: October 31, 1951 Referring Provider:     Pulmonary Rehab Walk Test from 09/09/2017 in [place]  Referring Provider  Dr. Chase Caller      Encounter Date: 09/30/2017  Check In: Session Check In - 09/30/17 1330      Check-In   Location  [facility]    Staff Present  Rosebud Poles, RN, BSN;Molly diVincenzo, MS, ACSM RCEP, Exercise Physiologist;Jihad Brownlow Rollene Rotunda, RN, BSN    Supervising physician immediately available to respond to emergencies  [facility] immediately available    Physician(s)  Dr. Quincy Simmonds    Medication changes reported      No    Fall or balance concerns reported     No    Tobacco Cessation  No Change    Warm-up and Cool-down  Performed as group-led instruction    Resistance Training Performed  Yes    VAD Patient?  No      Pain Assessment   Currently in Pain?  No/denies    Multiple Pain Sites  No       Capillary Blood Glucose: No results found for this or any previous visit (from the past 24 hour(s)). POCT Glucose - 09/30/17 1549      POCT Blood Glucose   Pre-Exercise  152 mg/dL    Post-Exercise  109 mg/dL      Exercise Prescription Changes - 09/30/17 1548      Response to Exercise   Blood Pressure (Admit)  102/50    Blood Pressure (Exercise)  138/74    Blood Pressure (Exit)  118/64    Heart Rate (Admit)  77 bpm    Heart Rate (Exercise)  92 bpm    Heart Rate (Exit)  84 bpm    Oxygen Saturation (Admit)  93 %    Oxygen Saturation (Exercise)  92 %    Oxygen Saturation (Exit)  92 %    Rating of Perceived Exertion (Exercise)  13    Perceived Dyspnea (Exercise)  1    Duration  Progress to 45 minutes of aerobic exercise without signs/symptoms of physical distress    Intensity  Other (comment) 40-80% of HRR   40-80% of HRR     Progression   Progression  Continue to progress workloads to maintain intensity without  signs/symptoms of physical distress.      Resistance Training   Training Prescription  Yes    Weight  orange bands    Reps  10-15    Time  10 Minutes      Rower   Level  1    Minutes  17      Track   Laps  13    Minutes  17       Social History   Tobacco Use  Smoking Status Never Smoker  Smokeless Tobacco Never Used  Tobacco Comment   pt states she experimented in college    Goals Met:  Independence with exercise equipment Improved SOB with ADL's Using PLB without cueing & demonstrates good technique Exercise tolerated well No report of cardiac concerns or symptoms Strength training completed today  Goals Unmet:  Not Applicable  Comments: Service time is from 1330 to 1530   Dr. Rush Farmer is Medical Director for Pulmonary Rehab at [facility].

## 2017-10-05 ENCOUNTER — Encounter (HOSPITAL_COMMUNITY)
Admission: RE | Admit: 2017-10-05 | Discharge: 2017-10-05 | Disposition: A | Discharge status: Home / Self Care | Payer: Medicare Other | Source: Ambulatory Visit | Attending: Internal Medicine | Admitting: Internal Medicine

## 2017-10-07 ENCOUNTER — Encounter (HOSPITAL_COMMUNITY)
Admission: RE | Admit: 2017-10-07 | Discharge: 2017-10-07 | Disposition: A | Discharge status: Home / Self Care | Payer: Medicare Other | Source: Ambulatory Visit | Attending: Internal Medicine | Admitting: Internal Medicine

## 2017-10-07 VITALS — Wt 142.9 lb

## 2017-10-07 DIAGNOSIS — J849 Interstitial pulmonary disease, unspecified: Secondary | ICD-10-CM

## 2017-10-07 NOTE — Progress Notes (Signed)
Pulmonary Individual Treatment Plan  Patient Details  Name: Rhonda [name] MRN: [id number] Date of Birth: 12/15/1950 Referring Provider:     Pulmonary Rehab Walk Test from 09/09/2017 in [place]  Referring Provider  Dr. Chase Caller      Initial Encounter Date:    Pulmonary Rehab Walk Test from 09/09/2017 in [place]  Date  09/09/17  Referring Provider  Dr. Chase Caller      Visit Diagnosis: ILD (interstitial lung disease) ([place])  Patient's Home Medications on Admission:   Current Outpatient Medications:  .  acetaminophen (TYLENOL) 325 MG tablet, Take 650 mg by mouth every 6 (six) hours as needed. Reported on 02/10/2016, Disp: , Rfl:  .  albuterol (PROAIR HFA) 108 (90 Base) MCG/ACT inhaler, Inhale 2 puffs into the lungs every 4 (four) hours as needed for wheezing. Or coughing spells.  You may use 2 Puffs 5-10 minutes before exercise., Disp: 1 Inhaler, Rfl: 3 .  Alpha-D-Galactosidase (BEANO PO), Take 1 tablet by mouth as needed (if eating onion or garlic). , Disp: , Rfl:  .  AMBULATORY NON FORMULARY MEDICATION, Cinnsulin cinnamon caplsule 1 by mouth daily, Disp: , Rfl:  .  Ascorbic Acid (VITAMIN C) 1000 MG tablet, Take 1,000 mg by mouth daily., Disp: , Rfl:  .  BREO ELLIPTA 100-25 MCG/INH AEPB, INHALE 1 PUFF INTO THE LUNGS DAILY., Disp: 180 each, Rfl: 1 .  Calcium Citrate-Vitamin D (CALCITRATE PLUS D PO), Take 1 tablet by mouth daily., Disp: , Rfl:  .  Calcium-Vitamin D-Vitamin K (VIACTIV) [id number] MG-UNT-MCG CHEW, Chew 1 tablet by mouth daily., Disp: , Rfl:  .  chlorpheniramine (CHLOR-TRIMETON) 4 MG tablet, Take 4 mg by mouth daily as needed for allergies. , Disp: , Rfl:  .  dicyclomine (BENTYL) 10 MG capsule, Take 10 mg by mouth daily. Reported on 01/06/2016, Disp: , Rfl:  .  ezetimibe (ZETIA) 10 MG tablet, Take 1 tablet (10 mg total) by mouth daily., Disp: 30 tablet, Rfl: 11 .  fluticasone (FLONASE) 50 MCG/ACT nasal  spray, SPRAY 2 SPRAYS INTO EACH NOSTRIL EVERY DAY, Disp: 1 g, Rfl: 12 .  metFORMIN (GLUCOPHAGE-XR) 500 MG 24 hr tablet, Take 500 mg by mouth daily., Disp: , Rfl:  .  metoprolol succinate (TOPROL-XL) 50 MG 24 hr tablet, TAKE 1 TABLET BY MOUTH EVERY DAY IMMEDIATELY FOLLOWING A MEAL. (Patient taking differently: Take 25 mg by mouth daily. TAKE 1 TABLET BY MOUTH EVERY DAY IMMEDIATELY FOLLOWING A MEAL.), Disp: 30 tablet, Rfl: 0 .  montelukast (SINGULAIR) 10 MG tablet, Take 1 tablet (10 mg total) by mouth daily., Disp: 30 tablet, Rfl: 0 .  Multiple Vitamin (MULTIVITAMIN) tablet, Take 1 tablet by mouth daily., Disp: , Rfl:  .  Naproxen Sodium (ALEVE) 220 MG CAPS, Take 1-2 capsules by mouth as needed., Disp: , Rfl:  .  Omega-3 Fatty Acids (FISH OIL) 1000 MG CAPS, Take 1,000 mg by mouth daily., Disp: , Rfl:  .  omeprazole (PRILOSEC) 20 MG capsule, Take 1 capsule (20 mg total) by mouth daily. (Patient taking differently: Take 20 mg by mouth daily. Patient takes 10 mg), Disp: 30 capsule, Rfl: 5 .  pravastatin (PRAVACHOL) 40 MG tablet, Take 40 mg by mouth daily., Disp: , Rfl:  .  predniSONE (DELTASONE) 10 MG tablet, Take 1 tablet (10 mg total) daily with breakfast by mouth., Disp: 60 tablet, Rfl: 2 .  predniSONE (DELTASONE) 5 MG tablet, Take 5 mg by mouth daily with breakfast., Disp: ,  Rfl:  .  Probiotic Product (PROBIOTIC ADVANCED PO), Take 1 capsule by mouth daily. , Disp: , Rfl:  .  Turmeric 500 MG CAPS, Take 1 capsule by mouth daily., Disp: , Rfl:   Past Medical History: Past Medical History:  Diagnosis Date  . Allergy   . Arthritis    history spinal stenosis. osteoarthritis right hip  . Asthma   . Cataract   . Coronary artery calcification seen on CAT scan 08/19/2017   >300 on CT scan 08/2017  . Depression   . DOE (dyspnea on exertion)    a. 04/2010 Lexi MV EF 71%, no ischemia/infarct;    . GERD (gastroesophageal reflux disease)   . H/O steroid therapy    Steroid use orally over 4 yrs- for Lung  Fibrosis  . Heart palpitations 02/28/2015  . Helicobacter pylori ab+   . Hiatal hernia   . High cholesterol   . History of chronic bronchitis    as child  . Hyperlipidemia, mixed 08/19/2017  . Hyperplastic colon polyp 2007  . Inguinal hernia    right  . Insulin resistance    past  . Interstitial lung disease ([place])   . MVP (mitral valve prolapse)    Posterior mitral valve leaflet with mild MR  . Pneumonitis, hypersensitivity ([place])    a. 09/2012 s/p Bx - ? 2/2 bird, mold, oil paint exposure ->on steroids, followed by pulm.  . Pulmonary fibrosis ([facility])    Dr. Chase Caller follows- stable at present  . Pulmonary HTN ([place])    mild PASP 71mHg on echo 02/2015  . PVC (premature ventricular contraction) 08/19/2017  . Rapid heart rate    Dr. TRadford Paxfollows- last visit Epic note 9'16    Tobacco Use: Social History   Tobacco Use  Smoking Status Never Smoker  Smokeless Tobacco Never Used  Tobacco Comment   pt states she experimented in college    Labs: Recent Review Flowsheet Data    Labs for ITP Cardiac and Pulmonary Rehab Latest Ref Rng & Units 02/08/2014 10/12/2014 01/22/2015 02/26/2015 08/21/2015   Cholestrol 0 - 200 mg/dL 210(H) 176 - 184 -   LDLCALC 0 - 99 mg/dL 113(H) 79 - 94 -   HDL >=46 mg/dL 53 50 - 53 -   Trlycerides <150 mg/dL 222(H) 236(H) - 185(H) -   Hemoglobin A1c <5.7 % 5.8 - 5.9 - 6.1(H)   PHART 7.350 - 7.450 - - - - -   PCO2ART 35.0 - 45.0 mmHg - - - - -   HCO3 20.0 - 24.0 mEq/L - - - - -   TCO2 0 - 100 mmol/L - - - - -   ACIDBASEDEF 0.0 - 2.0 mmol/L - - - - -   O2SAT % - - - - -      Capillary Blood Glucose: Lab Results  Component Value Date   GLUCAP 110 (H) 10/01/2012   POCT Glucose    Row Name 09/28/17 1523 09/30/17 1549           POCT Blood Glucose   Pre-Exercise  157 mg/dL  152 mg/dL      Post-Exercise  136 mg/dL  109 mg/dL         Pulmonary Assessment Scores: Pulmonary Assessment Scores    Row Name 09/08/17 0944 09/09/17 1632       ADL UCSD    ADL Phase  Entry  -    SOB Score total  21  -      CAT Score  CAT Score  13 Entry  -      mMRC Score   mMRC Score  -  1       Pulmonary Function Assessment: Pulmonary Function Assessment - 09/03/17 1158      Breath   Bilateral Breath Sounds  Clear    Shortness of Breath  Yes;Limiting activity extreme exertion or high elevations       Exercise Target Goals:    Exercise Program Goal: Individual exercise prescription set with THRR, safety & activity barriers. Participant demonstrates ability to understand and report RPE using BORG scale, to self-measure pulse accurately, and to acknowledge the importance of the exercise prescription.  Exercise Prescription Goal: Starting with aerobic activity 30 plus minutes a day, 3 days per week for initial exercise prescription. Provide home exercise prescription and guidelines that participant acknowledges understanding prior to discharge.  Activity Barriers & Risk Stratification: Activity Barriers & Cardiac Risk Stratification - 09/03/17 1138      Activity Barriers & Cardiac Risk Stratification   Activity Barriers  Right Hip Replacement;Shortness of Breath       6 Minute Walk: 6 Minute Walk    Row Name 09/09/17 1616         6 Minute Walk   Phase  Initial     Distance  1200 feet     Walk Time  6 minutes     # of Rest Breaks  0     MPH  2.27     METS  3.07     RPE  10     Perceived Dyspnea   1     Symptoms  No     Resting HR  87 bpm     Resting BP  130/78     Resting Oxygen Saturation   95 %     Exercise Oxygen Saturation  during 6 min walk  88 %     Max Ex. HR  109 bpm     Max Ex. BP  148/79       Interval HR   1 Minute HR  87     2 Minute HR  100     3 Minute HR  105     4 Minute HR  109     5 Minute HR  105     6 Minute HR  105     2 Minute Post HR  88     Interval Heart Rate?  Yes       Interval Oxygen   Interval Oxygen?  Yes     Baseline Oxygen Saturation %  95 %     1 Minute Oxygen Saturation %  93 %      1 Minute Liters of Oxygen  0 L     2 Minute Oxygen Saturation %  90 %     2 Minute Liters of Oxygen  0 L     3 Minute Oxygen Saturation %  89 %     3 Minute Liters of Oxygen  0 L     4 Minute Oxygen Saturation %  88 %     4 Minute Liters of Oxygen  0 L     5 Minute Oxygen Saturation %  89 %     5 Minute Liters of Oxygen  0 L     6 Minute Oxygen Saturation %  90 %     6 Minute Liters of Oxygen  0 L     2 Minute Post  Oxygen Saturation %  92 %     2 Minute Post Liters of Oxygen  0 L        Oxygen Initial Assessment: Oxygen Initial Assessment - 09/09/17 1631      Initial 6 min Walk   Oxygen Used  None      Program Oxygen Prescription   Program Oxygen Prescription  None       Oxygen Re-Evaluation: Oxygen Re-Evaluation    Row Name 10/05/17 0907             Program Oxygen Prescription   Program Oxygen Prescription  None         Home Oxygen   Home Oxygen Device  Portable Concentrator;Home Concentrator       Sleep Oxygen Prescription  None       Home Exercise Oxygen Prescription  None has not required oxygen while exercising in pulmonary rehab       Home at Rest Exercise Oxygen Prescription  None       Compliance with Home Oxygen Use  Yes         Goals/Expected Outcomes   Short Term Goals  To learn and exhibit compliance with exercise, home and travel O2 prescription       Long  Term Goals  Exhibits compliance with exercise, home and travel O2 prescription;Verbalizes importance of monitoring SPO2 with pulse oximeter and return demonstration;Exhibits proper breathing techniques, such as pursed lip breathing or other method taught during program session;Maintenance of O2 saturations>88%;Compliance with respiratory medication;Demonstrates proper use of MDI's          Oxygen Discharge (Final Oxygen Re-Evaluation): Oxygen Re-Evaluation - 10/05/17 0907      Program Oxygen Prescription   Program Oxygen Prescription  None      Home Oxygen   Home Oxygen Device  Portable  Concentrator;Home Concentrator    Sleep Oxygen Prescription  None    Home Exercise Oxygen Prescription  None has not required oxygen while exercising in pulmonary rehab    Home at Rest Exercise Oxygen Prescription  None    Compliance with Home Oxygen Use  Yes      Goals/Expected Outcomes   Short Term Goals  To learn and exhibit compliance with exercise, home and travel O2 prescription    Long  Term Goals  Exhibits compliance with exercise, home and travel O2 prescription;Verbalizes importance of monitoring SPO2 with pulse oximeter and return demonstration;Exhibits proper breathing techniques, such as pursed lip breathing or other method taught during program session;Maintenance of O2 saturations>88%;Compliance with respiratory medication;Demonstrates proper use of MDI's       Initial Exercise Prescription: Initial Exercise Prescription - 09/09/17 1600      Date of Initial Exercise RX and Referring Provider   Date  09/09/17    Referring Provider  Dr. Hilarie Fredrickson   Level  0.4    Minutes  17      Rower   Level  1    Minutes  17      Track   Laps  10    Minutes  17      Prescription Details   Frequency (times per week)  2    Duration  Progress to 45 minutes of aerobic exercise without signs/symptoms of physical distress      Intensity   THRR 40-80% of Max Heartrate  61-122    Ratings of Perceived Exertion  11-13    Perceived Dyspnea  0-4  Progression   Progression  Continue progressive overload as per policy without signs/symptoms or physical distress.      Resistance Training   Training Prescription  Yes    Weight  orange bands    Reps  10-15       Perform Capillary Blood Glucose checks as needed.  Exercise Prescription Changes: Exercise Prescription Changes    Row Name 09/28/17 1500 09/30/17 1548           Response to Exercise   Blood Pressure (Admit)  104/48  102/50      Blood Pressure (Exercise)  146/54  138/74      Blood Pressure (Exit)   90/46  118/64      Heart Rate (Admit)  91 bpm  77 bpm      Heart Rate (Exercise)  105 bpm  92 bpm      Heart Rate (Exit)  89 bpm  84 bpm      Oxygen Saturation (Admit)  92 %  93 %      Oxygen Saturation (Exercise)  90 %  92 %      Oxygen Saturation (Exit)  95 %  92 %      Rating of Perceived Exertion (Exercise)  15  13      Perceived Dyspnea (Exercise)  2  1      Duration  Progress to 45 minutes of aerobic exercise without signs/symptoms of physical distress  Progress to 45 minutes of aerobic exercise without signs/symptoms of physical distress      Intensity  Other (comment) 40-80% of HRR  Other (comment) 40-80% of HRR        Progression   Progression  Continue to progress workloads to maintain intensity without signs/symptoms of physical distress.  Continue to progress workloads to maintain intensity without signs/symptoms of physical distress.        Resistance Training   Training Prescription  Yes  Yes      Weight  orange bands  orange bands      Reps  10-15  10-15      Time  10 Minutes  10 Minutes        Bike   Level  3  -      Minutes  17  -        Rower   Level  1  1      Minutes  17  17        Track   Laps  15  13      Minutes  17  17         Exercise Comments:   Exercise Goals and Review: Exercise Goals    Row Name 09/03/17 1143             Exercise Goals   Increase Physical Activity  Yes       Intervention  Provide advice, education, support and counseling about physical activity/exercise needs.;Develop an individualized exercise prescription for aerobic and resistive training based on initial evaluation findings, risk stratification, comorbidities and participant's personal goals.       Expected Outcomes  Achievement of increased cardiorespiratory fitness and enhanced flexibility, muscular endurance and strength shown through measurements of functional capacity and personal statement of participant.       Increase Strength and Stamina  Yes        Intervention  Provide advice, education, support and counseling about physical activity/exercise needs.;Develop an individualized exercise prescription for aerobic and resistive training based on  initial evaluation findings, risk stratification, comorbidities and participant's personal goals.       Expected Outcomes  Achievement of increased cardiorespiratory fitness and enhanced flexibility, muscular endurance and strength shown through measurements of functional capacity and personal statement of participant.       Able to understand and use rate of perceived exertion (RPE) scale  Yes       Intervention  Provide education and explanation on how to use RPE scale       Expected Outcomes  Short Term: Able to use RPE daily in rehab to express subjective intensity level;Long Term:  Able to use RPE to guide intensity level when exercising independently       Able to understand and use Dyspnea scale  Yes       Intervention  Provide education and explanation on how to use Dyspnea scale       Expected Outcomes  Short Term: Able to use Dyspnea scale daily in rehab to express subjective sense of shortness of breath during exertion;Long Term: Able to use Dyspnea scale to guide intensity level when exercising independently       Knowledge and understanding of Target Heart Rate Range (THRR)  Yes       Intervention  Provide education and explanation of THRR including how the numbers were predicted and where they are located for reference       Expected Outcomes  Short Term: Able to state/look up THRR;Long Term: Able to use THRR to govern intensity when exercising independently;Short Term: Able to use daily as guideline for intensity in rehab       Understanding of Exercise Prescription  Yes       Intervention  Provide education, explanation, and written materials on patient's individual exercise prescription       Expected Outcomes  Short Term: Able to explain program exercise prescription;Long Term: Able to explain  home exercise prescription to exercise independently          Exercise Goals Re-Evaluation : Exercise Goals Re-Evaluation    Row Name 10/04/17 1649             Exercise Goal Re-Evaluation   Exercise Goals Review  Increase Strength and Stamina;Increase Physical Activity;Able to understand and use Dyspnea scale;Able to understand and use rate of perceived exertion (RPE) scale;Knowledge and understanding of Target Heart Rate Range (THRR);Understanding of Exercise Prescription       Comments  Patient has only attended 2 exercise sessions. Will cont. to monitor and progress as able.        Expected Outcomes  Through exercise at rehab and at home, patient will increase strength and stamina and will find that ADL's are easier to preform.           Discharge Exercise Prescription (Final Exercise Prescription Changes): Exercise Prescription Changes - 09/30/17 1548      Response to Exercise   Blood Pressure (Admit)  102/50    Blood Pressure (Exercise)  138/74    Blood Pressure (Exit)  118/64    Heart Rate (Admit)  77 bpm    Heart Rate (Exercise)  92 bpm    Heart Rate (Exit)  84 bpm    Oxygen Saturation (Admit)  93 %    Oxygen Saturation (Exercise)  92 %    Oxygen Saturation (Exit)  92 %    Rating of Perceived Exertion (Exercise)  13    Perceived Dyspnea (Exercise)  1    Duration  Progress to 45 minutes of aerobic  exercise without signs/symptoms of physical distress    Intensity  Other (comment) 40-80% of HRR      Progression   Progression  Continue to progress workloads to maintain intensity without signs/symptoms of physical distress.      Resistance Training   Training Prescription  Yes    Weight  orange bands    Reps  10-15    Time  10 Minutes      Rower   Level  1    Minutes  17      Track   Laps  13    Minutes  17       Nutrition:  Target Goals: Understanding of nutrition guidelines, daily intake of sodium <1551m, cholesterol <2051m calories 30% from fat and 7%  or less from saturated fats, daily to have 5 or more servings of fruits and vegetables.  Biometrics: Pre Biometrics - 09/03/17 1144      Pre Biometrics   Grip Strength  16 kg        Nutrition Therapy Plan and Nutrition Goals:   Nutrition Discharge: Rate Your Plate Scores:   Nutrition Goals Re-Evaluation:   Nutrition Goals Discharge (Final Nutrition Goals Re-Evaluation):   Psychosocial: Target Goals: Acknowledge presence or absence of significant depression and/or stress, maximize coping skills, provide positive support system. Participant is able to verbalize types and ability to use techniques and skills needed for reducing stress and depression.  Initial Review & Psychosocial Screening: Initial Psych Review & Screening - 09/03/17 1200      Initial Review   Current issues with  None Identified      Family Dynamics   Good Support System?  Yes      Barriers   Psychosocial barriers to participate in program  There are no identifiable barriers or psychosocial needs.       Quality of Life Scores:   PHQ-9: Recent Review Flowsheet Data    [facility] 09/03/2017 08/21/2015 03/04/2015 01/22/2015 10/12/2014   Decreased Interest 0 0 0 0 0   Down, Depressed, Hopeless 0 0 0 0 0   PHQ - 2 Score 0 0 0 0 0     Interpretation of Total Score  Total Score Depression Severity:  1-4 = Minimal depression, 5-9 = Mild depression, 10-14 = Moderate depression, 15-19 = Moderately severe depression, 20-27 = Severe depression   Psychosocial Evaluation and Intervention: Psychosocial Evaluation - 09/03/17 1200      Psychosocial Evaluation & Interventions   Interventions  Encouraged to exercise with the program and follow exercise prescription    Expected Outcomes  patient will remain free from psychosocial barriers during participation in pulmonary rehab    Continue Psychosocial Services   No Follow up required       Psychosocial Re-Evaluation: Psychosocial Re-Evaluation     [place] 10/05/17 0914             Psychosocial Re-Evaluation   Current issues with  None Identified       Interventions  Encouraged to attend Cardiac Rehabilitation for the exercise       Continue Psychosocial Services   No Follow up required          Psychosocial Discharge (Final Psychosocial Re-Evaluation): Psychosocial Re-Evaluation - 10/05/17 0914      Psychosocial Re-Evaluation   Current issues with  None Identified    Interventions  Encouraged to attend Cardiac Rehabilitation for the exercise    Continue Psychosocial Services   No Follow up required  Education: Education Goals: Education classes will be provided on a weekly basis, covering required topics. Participant will state understanding/return demonstration of topics presented.  Learning Barriers/Preferences: Learning Barriers/Preferences - 09/03/17 1158      Learning Barriers/Preferences   Learning Barriers  None    Learning Preferences  Written Material;Verbal Instruction       Education Topics: Risk Factor Reduction:  -Group instruction that is supported by a PowerPoint presentation. Instructor discusses the definition of a risk factor, different risk factors for pulmonary disease, and how the heart and lungs work together.     Nutrition for Pulmonary Patient:  -Group instruction provided by PowerPoint slides, verbal discussion, and written materials to support subject matter. The instructor gives an explanation and review of healthy diet recommendations, which includes a discussion on weight management, recommendations for fruit and vegetable consumption, as well as protein, fluid, caffeine, fiber, sodium, sugar, and alcohol. Tips for eating when patients are short of breath are discussed.   Pursed Lip Breathing:  -Group instruction that is supported by demonstration and informational handouts. Instructor discusses the benefits of pursed lip and diaphragmatic breathing and detailed demonstration  on how to preform both.     Oxygen Safety:  -Group instruction provided by PowerPoint, verbal discussion, and written material to support subject matter. There is an overview of "What is Oxygen" and "Why do we need it".  Instructor also reviews how to create a safe environment for oxygen use, the importance of using oxygen as prescribed, and the risks of noncompliance. There is a brief discussion on traveling with oxygen and resources the patient may utilize.   Oxygen Equipment:  -Group instruction provided by [name] Staff utilizing handouts, written materials, and equipment demonstrations.   Signs and Symptoms:  -Group instruction provided by written material and verbal discussion to support subject matter. Warning signs and symptoms of infection, stroke, and heart attack are reviewed and when to call the physician/911 reinforced. Tips for preventing the spread of infection discussed.   Advanced Directives:  -Group instruction provided by verbal instruction and written material to support subject matter. Instructor reviews Advanced Directive laws and proper instruction for filling out document.   Pulmonary Video:  -Group video education that reviews the importance of medication and oxygen compliance, exercise, good nutrition, pulmonary hygiene, and pursed lip and diaphragmatic breathing for the pulmonary patient.   Exercise for the Pulmonary Patient:  -Group instruction that is supported by a PowerPoint presentation. Instructor discusses benefits of exercise, core components of exercise, frequency, duration, and intensity of an exercise routine, importance of utilizing pulse oximetry during exercise, safety while exercising, and options of places to exercise outside of rehab.     Pulmonary Medications:  -Verbally interactive group education provided by instructor with focus on inhaled medications and proper administration.   Anatomy and Physiology of the Respiratory System and  Intimacy:  -Group instruction provided by PowerPoint, verbal discussion, and written material to support subject matter. Instructor reviews respiratory cycle and anatomical components of the respiratory system and their functions. Instructor also reviews differences in obstructive and restrictive respiratory diseases with examples of each. Intimacy, Sex, and Sexuality differences are reviewed with a discussion on how relationships can change when diagnosed with pulmonary disease. Common sexual concerns are reviewed.   MD DAY -A group question and answer session with a medical doctor that allows participants to ask questions that relate to their pulmonary disease state.   OTHER EDUCATION -Group or individual verbal, written, or video instructions that support the  educational goals of the pulmonary rehab program.   PULMONARY REHAB OTHER RESPIRATORY from 09/30/2017 in [place]  Date  -- [Holiday Eating]  Educator  Parke Simmers  Instruction Review Code  1- Verbalizes Understanding      Knowledge Questionnaire Score: Knowledge Questionnaire Score - 09/08/17 0944      Knowledge Questionnaire Score   Pre Score  13/13       Core Components/Risk Factors/Patient Goals at Admission: Personal Goals and Risk Factors at Admission - 09/03/17 1159      Core Components/Risk Factors/Patient Goals on Admission    Weight Management  Weight Loss;Yes    Intervention  Weight Management: Develop a combined nutrition and exercise program designed to reach desired caloric intake, while maintaining appropriate intake of nutrient and fiber, sodium and fats, and appropriate energy expenditure required for the weight goal.;Weight Management: Provide education and appropriate resources to help participant work on and attain dietary goals.;Weight Management/Obesity: Establish reasonable short term and long term weight goals.    Expected Outcomes  Short Term: Continue to assess and modify  interventions until short term weight is achieved;Weight Loss: Understanding of general recommendations for a balanced deficit meal plan, which promotes 1-2 lb weight loss per week and includes a negative energy balance of [phone] kcal/d;Understanding recommendations for meals to include 15-35% energy as protein, 25-35% energy from fat, 35-60% energy from carbohydrates, less than 247m of dietary cholesterol, 20-35 gm of total fiber daily;Understanding of distribution of calorie intake throughout the day with the consumption of 4-5 meals/snacks    Improve shortness of breath with ADL's  Yes    Intervention  Provide education, individualized exercise plan and daily activity instruction to help decrease symptoms of SOB with activities of daily living.    Expected Outcomes  Short Term: Achieves a reduction of symptoms when performing activities of daily living.       Core Components/Risk Factors/Patient Goals Review:  Goals and Risk Factor Review    Row Name 10/05/17 0913             Core Components/Risk Factors/Patient Goals Review   Personal Goals Review  Weight Management/Obesity;Improve shortness of breath with ADL's       Review  has only attended 2 exercise sessions, too early to see progression towards goals       Expected Outcomes  should see more results in the next 30 days          Core Components/Risk Factors/Patient Goals at Discharge (Final Review):  Goals and Risk Factor Review - 10/05/17 0913      Core Components/Risk Factors/Patient Goals Review   Personal Goals Review  Weight Management/Obesity;Improve shortness of breath with ADL's    Review  has only attended 2 exercise sessions, too early to see progression towards goals    Expected Outcomes  should see more results in the next 30 days       ITP Comments:   Comments: ITP REVIEW Pt is making expected progress toward pulmonary rehab goals after completing 2 sessions. Recommend continued exercise, life style  modification, education, and utilization of breathing techniques to increase stamina and strength and decrease shortness of breath with exertion.

## 2017-10-07 NOTE — Progress Notes (Signed)
Rhonda [name] 66 y.o. female   DOB: 1950/11/29 MRN: [id number]          Nutrition Note 1. ILD (interstitial lung disease) ([place])    Past Medical History:  Diagnosis Date  . Allergy   . Arthritis    history spinal stenosis. osteoarthritis right hip  . Asthma   . Cataract   . Coronary artery calcification seen on CAT scan 08/19/2017   >300 on CT scan 08/2017  . Depression   . DOE (dyspnea on exertion)    a. 04/2010 Lexi MV EF 71%, no ischemia/infarct;    . GERD (gastroesophageal reflux disease)   . H/O steroid therapy    Steroid use orally over 4 yrs- for Lung Fibrosis  . Heart palpitations 02/28/2015  . Helicobacter pylori ab+   . Hiatal hernia   . High cholesterol   . History of chronic bronchitis    as child  . Hyperlipidemia, mixed 08/19/2017  . Hyperplastic colon polyp 2007  . Inguinal hernia    right  . Insulin resistance    past  . Interstitial lung disease ([place])   . MVP (mitral valve prolapse)    Posterior mitral valve leaflet with mild MR  . Pneumonitis, hypersensitivity ([place])    a. 09/2012 s/p Bx - ? 2/2 bird, mold, oil paint exposure ->on steroids, followed by pulm.  . Pulmonary fibrosis ([facility])    Dr. Chase Caller follows- stable at present  . Pulmonary HTN ([place])    mild PASP 42mmHg on echo 02/2015  . PVC (premature ventricular contraction) 08/19/2017  . Rapid heart rate    Dr. Radford Pax follows- last visit Epic note 9'16   Meds reviewed. Metformin, Fish oil, Prednisone, Calcium Citrate with vitamin D, Tumeric, Probiotic noted  Ht: Ht Readings from Last 1 Encounters:  09/03/17 4' 11.25" (1.505 m)     Wt:  Wt Readings from Last 3 Encounters:  09/30/17 142 lb 10.2 oz (64.7 kg)  09/28/17 143 lb 4.8 oz (65 kg)  09/03/17 142 lb 10.2 oz (64.7 kg)     BMI: 28.6    Current tobacco use? No  Labs:  Lipid Panel  Total Cholesterol 231 Triglycerides  112 HDL   80 LDL   129 HgbA1c  6.1  Nutrition Note Spoke with pt. 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 Pt concerned re: DM dx and had questions re: diabetes. Diabetes diagnosis, management, and use/role of Metformin in DM tx discussed.   Nutrition Diagnosis ? Food-and nutrition-related knowledge deficit related to lack of exposure to information as related to diagnosis of pulmonary disease ? Overweight related to excessive energy intake as evidenced by a BMI of 28.6  Nutrition Intervention ? Pt's individual nutrition plan and goals reviewed with pt.  Goal(s) 1. Identify food quantities necessary to achieve wt loss of  -2# per week to a goal wt loss of 6-24 lb at graduation from pulmonary rehab. 2. Pt wants to work toward being off DM medication.  Plan:  Pt to attend Pulmonary Nutrition class Pt attended the Nutrition class re: Holiday Eating Survival tips 09/30/17. Will provide client-centered nutrition education as part of interdisciplinary care.   Monitor and evaluate progress toward nutrition goal with team.  Monitor and Evaluate progress toward nutrition goal with team.   Derek Mound, M.Ed, RD, LDN, CDE 10/07/2017 2:33 PM

## 2017-10-07 NOTE — Progress Notes (Signed)
Daily Session Note  Patient Details  Name: Rhonda [name] MRN: [id number] Date of Birth: [birth year] Referring Provider:     Pulmonary Rehab Walk Test from 09/09/2017 in [place]  Referring Provider  Dr. Chase Caller      Encounter Date: 10/07/2017  Check In: Session Check In - 10/07/17 1330      Check-In   Location  [facility]    Staff Present  Rosebud Poles, RN, BSN;Molly diVincenzo, MS, ACSM RCEP, Exercise Physiologist    Supervising physician immediately available to respond to emergencies  [facility] immediately available    Physician(s)  Dr. Broadus [name]    Medication changes reported      No    Fall or balance concerns reported     No    Tobacco Cessation  No Change    Warm-up and Cool-down  Performed as group-led instruction    Resistance Training Performed  Yes    VAD Patient?  No      Pain Assessment   Currently in Pain?  No/denies    Multiple Pain Sites  No       Capillary Blood Glucose: No results found for this or any previous visit (from the past 24 hour(s)).  Exercise Prescription Changes - 10/07/17 1500      Response to Exercise   Blood Pressure (Admit)  100/54    Blood Pressure (Exercise)  142/60    Blood Pressure (Exit)  114/60    Heart Rate (Admit)  72 bpm    Heart Rate (Exercise)  105 bpm    Heart Rate (Exit)  82 bpm    Oxygen Saturation (Admit)  95 %    Oxygen Saturation (Exercise)  89 %    Oxygen Saturation (Exit)  95 %    Rating of Perceived Exertion (Exercise)  12    Perceived Dyspnea (Exercise)  0    Duration  Progress to 45 minutes of aerobic exercise without signs/symptoms of physical distress    Intensity  THRR unchanged      Progression   Progression  Continue to progress workloads to maintain intensity without signs/symptoms of physical distress.      Resistance Training   Training Prescription  Yes    Weight  orange bands    Reps  10-15    Time  10 Minutes      Bike   Level   3    Minutes  17      Rower   Level  1    Minutes  17      Track   Laps  7    Minutes  17       Social History   Tobacco Use  Smoking Status Never Smoker  Smokeless Tobacco Never Used  Tobacco Comment   pt states she experimented in college    Goals Met:  Exercise tolerated well Strength training completed today  Goals Unmet:  Not Applicable  Comments: Service time is from 1330 to 1500    Dr. Rush Farmer is Medical Director for Pulmonary Rehab at [facility].

## 2017-10-08 NOTE — Progress Notes (Signed)
I have reviewed a Home Exercise Prescription with Rhonda [name] . Rhonda [name] is currently exercising at home.  The patient was advised to continue swimming 2 days a week for 30 minutes and also walk 1 mile before doing Tai Chi 2 days a week. Rhonda [name] and I discussed how to progress their exercise prescription.  The patient stated that their goals were to walk up hills easier, lose weight, and get off of her diabetes medicine.  The patient stated that they understand the exercise prescription.  We reviewed exercise guidelines, target heart rate during exercise, oxygen use, weather, home pulse oximeter, endpoints for exercise, and goals.  Patient is encouraged to come to me with any questions. I will continue to follow up with the patient to assist them with progression and safety.

## 2017-10-12 ENCOUNTER — Encounter (HOSPITAL_COMMUNITY)
Admission: RE | Admit: 2017-10-12 | Discharge: 2017-10-12 | Disposition: A | Discharge status: Home / Self Care | Payer: Medicare Other | Source: Ambulatory Visit | Attending: Internal Medicine | Admitting: Internal Medicine

## 2017-10-12 VITALS — Wt 142.4 lb

## 2017-10-12 DIAGNOSIS — J849 Interstitial pulmonary disease, unspecified: Secondary | ICD-10-CM

## 2017-10-12 NOTE — Progress Notes (Signed)
Daily Session Note  Patient Details  Name: Rhonda [name] MRN: [id number] Date of Birth: [birth year] Referring Provider:     Pulmonary Rehab Walk Test from 09/09/2017 in [place]  Referring Provider  Dr. Chase Caller      Encounter Date: 10/12/2017  Check In: Session Check In - 10/12/17 1330      Check-In   Location  [facility]    Staff Present  Rosebud Poles, RN, BSN;Molly diVincenzo, MS, ACSM RCEP, Exercise Physiologist;Shalayna Ornstein Ysidro Evert, RN;Portia Rollene Rotunda, RN, BSN    Supervising physician immediately available to respond to emergencies  [facility] immediately available    Physician(s)  Dr. Maylene Roes    Medication changes reported      No    Fall or balance concerns reported     No    Tobacco Cessation  No Change    Warm-up and Cool-down  Performed as group-led instruction    Resistance Training Performed  Yes    VAD Patient?  No      Pain Assessment   Currently in Pain?  No/denies    Multiple Pain Sites  No       Capillary Blood Glucose: No results found for this or any previous visit (from the past 24 hour(s)). POCT Glucose - 10/12/17 1522      POCT Blood Glucose   Pre-Exercise  125 mg/dL    Post-Exercise  98 mg/dL      Exercise Prescription Changes - 10/12/17 1500      Response to Exercise   Blood Pressure (Admit)  108/56    Blood Pressure (Exercise)  130/64    Blood Pressure (Exit)  120/64    Heart Rate (Admit)  72 bpm    Heart Rate (Exercise)  99 bpm    Heart Rate (Exit)  70 bpm    Oxygen Saturation (Admit)  94 %    Oxygen Saturation (Exercise)  92 %    Oxygen Saturation (Exit)  95 %    Rating of Perceived Exertion (Exercise)  17    Perceived Dyspnea (Exercise)  3    Duration  Progress to 45 minutes of aerobic exercise without signs/symptoms of physical distress    Intensity  THRR unchanged      Progression   Progression  Continue to progress workloads to maintain intensity without signs/symptoms of  physical distress.      Resistance Training   Training Prescription  Yes    Weight  orange bands    Reps  10-15    Time  10 Minutes      Bike   Level  3.5    Minutes  17      Rower   Level  1    Minutes  17      Track   Laps  15    Minutes  17       Social History   Tobacco Use  Smoking Status Never Smoker  Smokeless Tobacco Never Used  Tobacco Comment   pt states she experimented in college    Goals Met:  Exercise tolerated well No report of cardiac concerns or symptoms Strength training completed today  Goals Unmet:  Not Applicable  Comments: Service time is from 1330 to 1500    Dr. Rush Farmer is Medical Director for Pulmonary Rehab at [facility].

## 2017-10-19 ENCOUNTER — Encounter (HOSPITAL_COMMUNITY)
Admission: RE | Admit: 2017-10-19 | Discharge: 2017-10-19 | Disposition: A | Discharge status: Home / Self Care | Payer: Medicare Other | Source: Ambulatory Visit | Attending: Internal Medicine | Admitting: Internal Medicine

## 2017-10-19 VITALS — Wt 142.0 lb

## 2017-10-19 DIAGNOSIS — J849 Interstitial pulmonary disease, unspecified: Secondary | ICD-10-CM | POA: Diagnosis not present

## 2017-10-19 NOTE — Progress Notes (Signed)
Daily Session Note  Patient Details  Name: Rhonda [name] MRN: [id number] Date of Birth: [birth year] Referring Provider:     Pulmonary Rehab Walk Test from 09/09/2017 in [place]  Referring Provider  Dr. Chase Caller      Encounter Date: 10/19/2017  Check In: Session Check In - 10/19/17 1541      Check-In   Location  [facility]    Staff Present  Rosebud Poles, RN, BSN;Molly diVincenzo, MS, ACSM RCEP, Exercise Physiologist;[name] Ysidro Evert, RN;Jameisha Stofko Rollene Rotunda, RN, BSN    Supervising physician immediately available to respond to emergencies  [facility] immediately available    Physician(s)  Dr. Maylene Roes    Medication changes reported      No    Fall or balance concerns reported     No    Tobacco Cessation  No Change    Warm-up and Cool-down  Performed as group-led instruction    Resistance Training Performed  Yes    VAD Patient?  No      Pain Assessment   Currently in Pain?  No/denies    Multiple Pain Sites  No       Capillary Blood Glucose: No results found for this or any previous visit (from the past 24 hour(s)).  Exercise Prescription Changes - 10/19/17 1541      Response to Exercise   Blood Pressure (Admit)  98/56    Blood Pressure (Exercise)  110/46    Blood Pressure (Exit)  110/54    Heart Rate (Admit)  80 bpm    Heart Rate (Exercise)  96 bpm    Heart Rate (Exit)  85 bpm    Oxygen Saturation (Admit)  95 %    Oxygen Saturation (Exercise)  92 %    Oxygen Saturation (Exit)  92 %    Rating of Perceived Exertion (Exercise)  17    Perceived Dyspnea (Exercise)  2    Duration  Progress to 45 minutes of aerobic exercise without signs/symptoms of physical distress    Intensity  THRR unchanged      Progression   Progression  Continue to progress workloads to maintain intensity without signs/symptoms of physical distress.      Resistance Training   Training Prescription  Yes    Weight  orange bands    Reps  10-15    Time  10 Minutes      Bike   Level  3.5    Minutes  17      Rower   Level  2    Minutes  17      Track   Laps  16    Minutes  17       Social History   Tobacco Use  Smoking Status Never Smoker  Smokeless Tobacco Never Used  Tobacco Comment   pt states she experimented in college    Goals Met:  Independence with exercise equipment Improved SOB with ADL's Using PLB without cueing & demonstrates good technique Exercise tolerated well No report of cardiac concerns or symptoms Strength training completed today  Goals Unmet:  Not Applicable  Comments: Service time is from 1330 to 1515   Dr. Rush Farmer is Medical Director for Pulmonary Rehab at [facility].

## 2017-10-21 ENCOUNTER — Encounter (HOSPITAL_COMMUNITY)
Admission: RE | Admit: 2017-10-21 | Discharge: 2017-10-21 | Disposition: A | Discharge status: Home / Self Care | Payer: Medicare Other | Source: Ambulatory Visit | Attending: Internal Medicine | Admitting: Internal Medicine

## 2017-10-21 VITALS — Wt 143.5 lb

## 2017-10-21 DIAGNOSIS — J849 Interstitial pulmonary disease, unspecified: Secondary | ICD-10-CM

## 2017-10-21 NOTE — Progress Notes (Signed)
Daily Session Note  Patient Details  Name: Rhonda [name] MRN: [id number] Date of Birth: [birth year] Referring Provider:     Pulmonary Rehab Walk Test from 09/09/2017 in [place]  Referring Provider  Dr. Chase Caller      Encounter Date: 10/21/2017  Check In: Session Check In - 10/21/17 1330      Check-In   Location  [facility]    Staff Present  Rosebud Poles, RN, BSN;Molly diVincenzo, MS, ACSM RCEP, Exercise Physiologist;Julliana Whitmyer Ysidro Evert, RN;Portia Rollene Rotunda, RN, BSN    Supervising physician immediately available to respond to emergencies  [facility] immediately available    Physician(s)  Dr. [place] Crane    Medication changes reported      No    Fall or balance concerns reported     No    Tobacco Cessation  No Change    Warm-up and Cool-down  Performed as group-led instruction    Resistance Training Performed  Yes    VAD Patient?  No      Pain Assessment   Currently in Pain?  No/denies    Multiple Pain Sites  No       Capillary Blood Glucose: No results found for this or any previous visit (from the past 24 hour(s)).  Exercise Prescription Changes - 10/21/17 1600      Response to Exercise   Blood Pressure (Admit)  106/56    Blood Pressure (Exercise)  120/62    Blood Pressure (Exit)  102/60    Heart Rate (Admit)  71 bpm    Heart Rate (Exercise)  92 bpm    Heart Rate (Exit)  75 bpm    Oxygen Saturation (Admit)  95 %    Oxygen Saturation (Exercise)  91 %    Oxygen Saturation (Exit)  95 %    Rating of Perceived Exertion (Exercise)  17    Perceived Dyspnea (Exercise)  3    Duration  Progress to 45 minutes of aerobic exercise without signs/symptoms of physical distress    Intensity  THRR unchanged      Progression   Progression  Continue to progress workloads to maintain intensity without signs/symptoms of physical distress.      Resistance Training   Training Prescription  Yes    Weight  orange bands    Reps  10-15    Time  10 Minutes      Bike   Level  3.5    Minutes  17      Track   Laps  11    Minutes  17       Social History   Tobacco Use  Smoking Status Never Smoker  Smokeless Tobacco Never Used  Tobacco Comment   pt states she experimented in college    Goals Met:  Exercise tolerated well No report of cardiac concerns or symptoms Strength training completed today  Goals Unmet:  Not Applicable  Comments: Service time is from 1330 to 1535    Dr. Rush Farmer is Medical Director for Pulmonary Rehab at [facility].

## 2017-10-23 ENCOUNTER — Other Ambulatory Visit: Payer: Self-pay | Admitting: Internal Medicine

## 2017-10-26 ENCOUNTER — Telehealth (HOSPITAL_COMMUNITY): Payer: Self-pay | Admitting: Family Medicine

## 2017-10-26 ENCOUNTER — Encounter (HOSPITAL_COMMUNITY)
Admission: RE | Admit: 2017-10-26 | Discharge: 2017-10-26 | Disposition: A | Discharge status: Home / Self Care | Payer: Medicare Other | Source: Ambulatory Visit | Attending: Internal Medicine | Admitting: Internal Medicine

## 2017-10-26 ENCOUNTER — Encounter (HOSPITAL_COMMUNITY)
Admission: RE | Admit: 2017-10-26 | Discharge: 2017-10-26 | Disposition: A | Discharge status: Home / Self Care | Payer: Medicare Other | Source: Ambulatory Visit

## 2017-10-26 VITALS — Wt 143.1 lb

## 2017-10-26 DIAGNOSIS — J849 Interstitial pulmonary disease, unspecified: Secondary | ICD-10-CM

## 2017-10-26 NOTE — Progress Notes (Signed)
Daily Session Note  Patient Details  Name: Rhonda [name] MRN: [id number] Date of Birth: 1951-02-24 Referring Provider:     Pulmonary Rehab Walk Test from 09/09/2017 in [place]  Referring Provider  Dr. Chase Caller      Encounter Date: 10/26/2017  Check In: Session Check In - 10/26/17 1458      Check-In   Location  [facility]    Staff Present  Su Hilt, MS, ACSM RCEP, Exercise Physiologist;[name] Ysidro Evert, RN;Portia [place], RN, Maxcine Ham, RN, BSN    Supervising physician immediately available to respond to emergencies  [facility] immediately available    Physician(s)  Dr. [place] Crane    Medication changes reported      No    Fall or balance concerns reported     No    Tobacco Cessation  No Change    Warm-up and Cool-down  Performed as group-led instruction    Resistance Training Performed  Yes    VAD Patient?  No      Pain Assessment   Currently in Pain?  No/denies    Multiple Pain Sites  No       Capillary Blood Glucose: No results found for this or any previous visit (from the past 24 hour(s)). POCT Glucose - 10/26/17 1502      POCT Blood Glucose   Pre-Exercise  143 mg/dL    Post-Exercise  95 mg/dL      Exercise Prescription Changes - 10/26/17 1500      Response to Exercise   Blood Pressure (Admit)  92/50    Blood Pressure (Exercise)  140/70    Blood Pressure (Exit)  100/56    Heart Rate (Admit)  76 bpm    Heart Rate (Exercise)  93 bpm    Heart Rate (Exit)  69 bpm    Oxygen Saturation (Admit)  94 %    Oxygen Saturation (Exercise)  91 %    Oxygen Saturation (Exit)  96 %    Rating of Perceived Exertion (Exercise)  17    Perceived Dyspnea (Exercise)  3    Duration  Progress to 45 minutes of aerobic exercise without signs/symptoms of physical distress    Intensity  THRR unchanged      Progression   Progression  Continue to progress workloads to maintain intensity without signs/symptoms of physical  distress.      Resistance Training   Training Prescription  Yes    Weight  orange bands    Reps  10-15    Time  10 Minutes      Bike   Level  3.5    Minutes  17      Rower   Level  2    Minutes  17      Track   Laps  13    Minutes  17       Social History   Tobacco Use  Smoking Status Never Smoker  Smokeless Tobacco Never Used  Tobacco Comment   pt states she experimented in college    Goals Met:  Exercise tolerated well No report of cardiac concerns or symptoms Strength training completed today  Goals Unmet:  Not Applicable  Comments: Service time is from 1:30p to 3:00p    Dr. Rush Farmer is Medical Director for Pulmonary Rehab at [facility].

## 2017-10-28 ENCOUNTER — Encounter (HOSPITAL_COMMUNITY)
Admission: RE | Admit: 2017-10-28 | Discharge: 2017-10-28 | Disposition: A | Discharge status: Home / Self Care | Payer: Medicare Other | Source: Ambulatory Visit | Attending: Internal Medicine | Admitting: Internal Medicine

## 2017-10-28 VITALS — Wt 142.6 lb

## 2017-10-28 DIAGNOSIS — J849 Interstitial pulmonary disease, unspecified: Secondary | ICD-10-CM

## 2017-10-28 NOTE — Progress Notes (Signed)
Daily Session Note  Patient Details  Name: Rhonda [name] MRN: [id number] Date of Birth: September 20, 1951 Referring Provider:     Pulmonary Rehab Walk Test from 09/09/2017 in [place]  Referring Provider  Dr. Chase Caller      Encounter Date: 10/28/2017  Check In: Session Check In - 10/28/17 1330      Check-In   Location  [facility]    Staff Present  Su Hilt, MS, ACSM RCEP, Exercise Physiologist;[name] Ysidro Evert, RN;Rondle Lohse [place], RN, Maxcine Ham, RN, BSN    Supervising physician immediately available to respond to emergencies  [facility] immediately available    Physician(s)  Dr. Starla Link    Medication changes reported      No    Fall or balance concerns reported     No    Tobacco Cessation  No Change    Warm-up and Cool-down  Performed as group-led instruction    Resistance Training Performed  Yes    VAD Patient?  No      Pain Assessment   Currently in Pain?  No/denies    Multiple Pain Sites  No       Capillary Blood Glucose: No results found for this or any previous visit (from the past 24 hour(s)). POCT Glucose - 10/28/17 1609      POCT Blood Glucose   Pre-Exercise  208 mg/dL    Post-Exercise  108 mg/dL      Exercise Prescription Changes - 10/28/17 1608      Response to Exercise   Blood Pressure (Admit)  108/50    Blood Pressure (Exercise)  146/66    Blood Pressure (Exit)  102/70    Heart Rate (Admit)  75 bpm    Heart Rate (Exercise)  97 bpm    Heart Rate (Exit)  82 bpm    Oxygen Saturation (Admit)  93 %    Oxygen Saturation (Exercise)  92 %    Oxygen Saturation (Exit)  94 %    Rating of Perceived Exertion (Exercise)  17    Perceived Dyspnea (Exercise)  3    Duration  Progress to 45 minutes of aerobic exercise without signs/symptoms of physical distress    Intensity  THRR unchanged      Progression   Progression  Continue to progress workloads to maintain intensity without signs/symptoms of  physical distress.      Resistance Training   Training Prescription  Yes    Weight  orange bands    Reps  10-15    Time  10 Minutes      Bike   Level  3.5    Minutes  17      Track   Laps  15    Minutes  17       Social History   Tobacco Use  Smoking Status Never Smoker  Smokeless Tobacco Never Used  Tobacco Comment   pt states she experimented in college    Goals Met:  Independence with exercise equipment Improved SOB with ADL's Using PLB without cueing & demonstrates good technique Exercise tolerated well No report of cardiac concerns or symptoms Strength training completed today  Goals Unmet:  Not Applicable  Comments: Service time is from 1330 to 1515   Dr. Rush Farmer is Medical Director for Pulmonary Rehab at [facility].

## 2017-10-28 NOTE — Progress Notes (Signed)
Pulmonary Individual Treatment Plan  Patient Details  Name: Rhonda [name] MRN: [id number] Date of Birth: 04/30/1951 Referring Provider:     Pulmonary Rehab Walk Test from 09/09/2017 in [place]  Referring Provider  Dr. Chase Caller      Initial Encounter Date:    Pulmonary Rehab Walk Test from 09/09/2017 in [place]  Date  09/09/17  Referring Provider  Dr. Chase Caller      Visit Diagnosis: ILD (interstitial lung disease) ([place])  Patient's Home Medications on Admission:   Current Outpatient Medications:  .  acetaminophen (TYLENOL) 325 MG tablet, Take 650 mg by mouth every 6 (six) hours as needed. Reported on 02/10/2016, Disp: , Rfl:  .  albuterol (PROAIR HFA) 108 (90 Base) MCG/ACT inhaler, Inhale 2 puffs into the lungs every 4 (four) hours as needed for wheezing. Or coughing spells.  You may use 2 Puffs 5-10 minutes before exercise., Disp: 1 Inhaler, Rfl: 3 .  Alpha-D-Galactosidase (BEANO PO), Take 1 tablet by mouth as needed (if eating onion or garlic). , Disp: , Rfl:  .  AMBULATORY NON FORMULARY MEDICATION, Cinnsulin cinnamon caplsule 1 by mouth daily, Disp: , Rfl:  .  Ascorbic Acid (VITAMIN C) 1000 MG tablet, Take 1,000 mg by mouth daily., Disp: , Rfl:  .  BREO ELLIPTA 100-25 MCG/INH AEPB, TAKE 1 PUFF BY MOUTH EVERY DAY, Disp: 180 each, Rfl: 1 .  Calcium Citrate-Vitamin D (CALCITRATE PLUS D PO), Take 1 tablet by mouth daily., Disp: , Rfl:  .  Calcium-Vitamin D-Vitamin K (VIACTIV) 268-341-96 MG-UNT-MCG CHEW, Chew 1 tablet by mouth daily., Disp: , Rfl:  .  chlorpheniramine (CHLOR-TRIMETON) 4 MG tablet, Take 4 mg by mouth daily as needed for allergies. , Disp: , Rfl:  .  dicyclomine (BENTYL) 10 MG capsule, Take 10 mg by mouth daily. Reported on 01/06/2016, Disp: , Rfl:  .  ezetimibe (ZETIA) 10 MG tablet, Take 1 tablet (10 mg total) by mouth daily., Disp: 30 tablet, Rfl: 11 .  fluticasone (FLONASE) 50 MCG/ACT nasal spray,  SPRAY 2 SPRAYS INTO EACH NOSTRIL EVERY DAY, Disp: 1 g, Rfl: 12 .  metFORMIN (GLUCOPHAGE-XR) 500 MG 24 hr tablet, Take 500 mg by mouth daily., Disp: , Rfl:  .  metoprolol succinate (TOPROL-XL) 50 MG 24 hr tablet, TAKE 1 TABLET BY MOUTH EVERY DAY IMMEDIATELY FOLLOWING A MEAL. (Patient taking differently: Take 25 mg by mouth daily. TAKE 1 TABLET BY MOUTH EVERY DAY IMMEDIATELY FOLLOWING A MEAL.), Disp: 30 tablet, Rfl: 0 .  montelukast (SINGULAIR) 10 MG tablet, Take 1 tablet (10 mg total) by mouth daily., Disp: 30 tablet, Rfl: 0 .  Multiple Vitamin (MULTIVITAMIN) tablet, Take 1 tablet by mouth daily., Disp: , Rfl:  .  Naproxen Sodium (ALEVE) 220 MG CAPS, Take 1-2 capsules by mouth as needed., Disp: , Rfl:  .  Omega-3 Fatty Acids (FISH OIL) 1000 MG CAPS, Take 1,000 mg by mouth daily., Disp: , Rfl:  .  omeprazole (PRILOSEC) 20 MG capsule, Take 1 capsule (20 mg total) by mouth daily. (Patient taking differently: Take 20 mg by mouth daily. Patient takes 10 mg), Disp: 30 capsule, Rfl: 5 .  pravastatin (PRAVACHOL) 40 MG tablet, Take 40 mg by mouth daily., Disp: , Rfl:  .  predniSONE (DELTASONE) 10 MG tablet, Take 1 tablet (10 mg total) daily with breakfast by mouth., Disp: 60 tablet, Rfl: 2 .  predniSONE (DELTASONE) 5 MG tablet, Take 5 mg by mouth daily with breakfast., Disp: ,  Rfl:  .  Probiotic Product (PROBIOTIC ADVANCED PO), Take 1 capsule by mouth daily. , Disp: , Rfl:  .  Turmeric 500 MG CAPS, Take 1 capsule by mouth daily., Disp: , Rfl:   Past Medical History: Past Medical History:  Diagnosis Date  . Allergy   . Arthritis    history spinal stenosis. osteoarthritis right hip  . Asthma   . Cataract   . Coronary artery calcification seen on CAT scan 08/19/2017   >300 on CT scan 08/2017  . Depression   . DOE (dyspnea on exertion)    a. 04/2010 Lexi MV EF 71%, no ischemia/infarct;    . GERD (gastroesophageal reflux disease)   . H/O steroid therapy    Steroid use orally over 4 yrs- for Lung  Fibrosis  . Heart palpitations 02/28/2015  . Helicobacter pylori ab+   . Hiatal hernia   . High cholesterol   . History of chronic bronchitis    as child  . Hyperlipidemia, mixed 08/19/2017  . Hyperplastic colon polyp 2007  . Inguinal hernia    right  . Insulin resistance    past  . Interstitial lung disease ([place])   . MVP (mitral valve prolapse)    Posterior mitral valve leaflet with mild MR  . Pneumonitis, hypersensitivity ([place])    a. 09/2012 s/p Bx - ? 2/2 bird, mold, oil paint exposure ->on steroids, followed by pulm.  . Pulmonary fibrosis ([facility])    Dr. Chase Caller follows- stable at present  . Pulmonary HTN ([place])    mild PASP 53mHg on echo 02/2015  . PVC (premature ventricular contraction) 08/19/2017  . Rapid heart rate    Dr. TRadford Paxfollows- last visit Epic note 9'16    Tobacco Use: Social History   Tobacco Use  Smoking Status Never Smoker  Smokeless Tobacco Never Used  Tobacco Comment   pt states she experimented in college    Labs: Recent Review Flowsheet Data    Labs for ITP Cardiac and Pulmonary Rehab Latest Ref Rng & Units 02/08/2014 10/12/2014 01/22/2015 02/26/2015 08/21/2015   Cholestrol 0 - 200 mg/dL 210(H) 176 - 184 -   LDLCALC 0 - 99 mg/dL 113(H) 79 - 94 -   HDL >=46 mg/dL 53 50 - 53 -   Trlycerides <150 mg/dL 222(H) 236(H) - 185(H) -   Hemoglobin A1c <5.7 % 5.8 - 5.9 - 6.1(H)   PHART 7.350 - 7.450 - - - - -   PCO2ART 35.0 - 45.0 mmHg - - - - -   HCO3 20.0 - 24.0 mEq/L - - - - -   TCO2 0 - 100 mmol/L - - - - -   ACIDBASEDEF 0.0 - 2.0 mmol/L - - - - -   O2SAT % - - - - -      Capillary Blood Glucose: Lab Results  Component Value Date   GLUCAP 110 (H) 10/01/2012   POCT Glucose    Row Name 09/28/17 1523 09/30/17 1549 10/12/17 1522 10/26/17 1502       POCT Blood Glucose   Pre-Exercise  157 mg/dL  152 mg/dL  125 mg/dL  143 mg/dL    Post-Exercise  136 mg/dL  109 mg/dL  98 mg/dL  95 mg/dL       Pulmonary Assessment Scores: Pulmonary Assessment Scores     Row Name 09/08/17 0944 09/09/17 1632       ADL UCSD   ADL Phase  Entry  -    SOB Score total  21  -  CAT Score   CAT Score  13 Entry  -      mMRC Score   mMRC Score  -  1       Pulmonary Function Assessment: Pulmonary Function Assessment - 09/03/17 1158      Breath   Bilateral Breath Sounds  Clear    Shortness of Breath  Yes;Limiting activity extreme exertion or high elevations       Exercise Target Goals:    Exercise Program Goal: Individual exercise prescription set with THRR, safety & activity barriers. Participant demonstrates ability to understand and report RPE using BORG scale, to self-measure pulse accurately, and to acknowledge the importance of the exercise prescription.  Exercise Prescription Goal: Starting with aerobic activity 30 plus minutes a day, 3 days per week for initial exercise prescription. Provide home exercise prescription and guidelines that participant acknowledges understanding prior to discharge.  Activity Barriers & Risk Stratification: Activity Barriers & Cardiac Risk Stratification - 09/03/17 1138      Activity Barriers & Cardiac Risk Stratification   Activity Barriers  Right Hip Replacement;Shortness of Breath       6 Minute Walk: 6 Minute Walk    Row Name 09/09/17 1616         6 Minute Walk   Phase  Initial     Distance  1200 feet     Walk Time  6 minutes     # of Rest Breaks  0     MPH  2.27     METS  3.07     RPE  10     Perceived Dyspnea   1     Symptoms  No     Resting HR  87 bpm     Resting BP  130/78     Resting Oxygen Saturation   95 %     Exercise Oxygen Saturation  during 6 min walk  88 %     Max Ex. HR  109 bpm     Max Ex. BP  148/79       Interval HR   1 Minute HR  87     2 Minute HR  100     3 Minute HR  105     4 Minute HR  109     5 Minute HR  105     6 Minute HR  105     2 Minute Post HR  88     Interval Heart Rate?  Yes       Interval Oxygen   Interval Oxygen?  Yes     Baseline Oxygen  Saturation %  95 %     1 Minute Oxygen Saturation %  93 %     1 Minute Liters of Oxygen  0 L     2 Minute Oxygen Saturation %  90 %     2 Minute Liters of Oxygen  0 L     3 Minute Oxygen Saturation %  89 %     3 Minute Liters of Oxygen  0 L     4 Minute Oxygen Saturation %  88 %     4 Minute Liters of Oxygen  0 L     5 Minute Oxygen Saturation %  89 %     5 Minute Liters of Oxygen  0 L     6 Minute Oxygen Saturation %  90 %     6 Minute Liters of Oxygen  0 L  2 Minute Post Oxygen Saturation %  92 %     2 Minute Post Liters of Oxygen  0 L        Oxygen Initial Assessment: Oxygen Initial Assessment - 09/09/17 1631      Initial 6 min Walk   Oxygen Used  None      Program Oxygen Prescription   Program Oxygen Prescription  None       Oxygen Re-Evaluation: Oxygen Re-Evaluation    Row Name 10/05/17 0907 10/28/17 0954           Program Oxygen Prescription   Program Oxygen Prescription  None  None        Home Oxygen   Home Oxygen Device  Portable Concentrator;Home Concentrator  Portable Concentrator;Home Concentrator      Sleep Oxygen Prescription  None  None      Home Exercise Oxygen Prescription  None has not required oxygen while exercising in pulmonary rehab  None      Liters per minute  -  0      Home at Rest Exercise Oxygen Prescription  None  None      Compliance with Home Oxygen Use  Yes  -        Goals/Expected Outcomes   Short Term Goals  To learn and exhibit compliance with exercise, home and travel O2 prescription  -      Long  Term Goals  Exhibits compliance with exercise, home and travel O2 prescription;Verbalizes importance of monitoring SPO2 with pulse oximeter and return demonstration;Exhibits proper breathing techniques, such as pursed lip breathing or other method taught during program session;Maintenance of O2 saturations>88%;Compliance with respiratory medication;Demonstrates proper use of MDI's  -      Comments  -  Is not using oxygen now, but did  require it n the past         Oxygen Discharge (Final Oxygen Re-Evaluation): Oxygen Re-Evaluation - 10/28/17 0954      Program Oxygen Prescription   Program Oxygen Prescription  None      Home Oxygen   Home Oxygen Device  Portable Concentrator;Home Concentrator    Sleep Oxygen Prescription  None    Home Exercise Oxygen Prescription  None    Liters per minute  0    Home at Rest Exercise Oxygen Prescription  None      Goals/Expected Outcomes   Comments  Is not using oxygen now, but did require it n the past       Initial Exercise Prescription: Initial Exercise Prescription - 09/09/17 1600      Date of Initial Exercise RX and Referring Provider   Date  09/09/17    Referring Provider  Dr. Chase Caller      Bike   Level  0.4    Minutes  17      Rower   Level  1    Minutes  17      Track   Laps  10    Minutes  17      Prescription Details   Frequency (times per week)  2    Duration  Progress to 45 minutes of aerobic exercise without signs/symptoms of physical distress      Intensity   THRR 40-80% of Max Heartrate  61-122    Ratings of Perceived Exertion  11-13    Perceived Dyspnea  0-4      Progression   Progression  Continue progressive overload as per policy without signs/symptoms or physical distress.  Resistance Training   Training Prescription  Yes    Weight  orange bands    Reps  10-15       Perform Capillary Blood Glucose checks as needed.  Exercise Prescription Changes: Exercise Prescription Changes    Row Name 09/28/17 1500 09/30/17 1548 10/07/17 1500 10/12/17 1500 10/19/17 1541     Response to Exercise   Blood Pressure (Admit)  104/48  102/50  100/54  108/56  98/56   Blood Pressure (Exercise)  146/54  138/74  142/60  130/64  110/46   Blood Pressure (Exit)  90/46  118/64  114/60  120/64  110/54   Heart Rate (Admit)  91 bpm  77 bpm  72 bpm  72 bpm  80 bpm   Heart Rate (Exercise)  105 bpm  92 bpm  105 bpm  99 bpm  96 bpm   Heart Rate (Exit)   89 bpm  84 bpm  82 bpm  70 bpm  85 bpm   Oxygen Saturation (Admit)  92 %  93 %  95 %  94 %  95 %   Oxygen Saturation (Exercise)  90 %  92 %  89 %  92 %  92 %   Oxygen Saturation (Exit)  95 %  92 %  95 %  95 %  92 %   Rating of Perceived Exertion (Exercise)  '15  13  12  17  17   '$ Perceived Dyspnea (Exercise)  2  1  0  3  2   Duration  Progress to 45 minutes of aerobic exercise without signs/symptoms of physical distress  Progress to 45 minutes of aerobic exercise without signs/symptoms of physical distress  Progress to 45 minutes of aerobic exercise without signs/symptoms of physical distress  Progress to 45 minutes of aerobic exercise without signs/symptoms of physical distress  Progress to 45 minutes of aerobic exercise without signs/symptoms of physical distress   Intensity  Other (comment) 40-80% of HRR  Other (comment) 40-80% of HRR  THRR unchanged  THRR unchanged  THRR unchanged     Progression   Progression  Continue to progress workloads to maintain intensity without signs/symptoms of physical distress.  Continue to progress workloads to maintain intensity without signs/symptoms of physical distress.  Continue to progress workloads to maintain intensity without signs/symptoms of physical distress.  Continue to progress workloads to maintain intensity without signs/symptoms of physical distress.  Continue to progress workloads to maintain intensity without signs/symptoms of physical distress.     Resistance Training   Training Prescription  Yes  Yes  Yes  Yes  Yes   Weight  orange bands  orange bands  orange bands  orange bands  orange bands   Reps  10-15  10-15  10-15  10-15  10-15   Time  10 Minutes  10 Minutes  10 Minutes  10 Minutes  10 Minutes     Bike   Level  3  -  3  3.5  3.5   Minutes  17  -  '17  17  17     '$ Rower   Level  '1  1  1  1  2   '$ Minutes  '17  17  17  17  17     '$ Track   Laps  '15  13  7  15  16   '$ Minutes  '17  17  17  17  17     '$ Home Exercise Plan   Plans to continue  exercise at  [facility] (comment)  -  -   Frequency  -  -  Add 3 additional days to program exercise sessions.  -  -   Row Name 10/21/17 1600 10/26/17 1500           Response to Exercise   Blood Pressure (Admit)  106/56  92/50      Blood Pressure (Exercise)  120/62  140/70      Blood Pressure (Exit)  102/60  100/56      Heart Rate (Admit)  71 bpm  76 bpm      Heart Rate (Exercise)  92 bpm  93 bpm      Heart Rate (Exit)  75 bpm  69 bpm      Oxygen Saturation (Admit)  95 %  94 %      Oxygen Saturation (Exercise)  91 %  91 %      Oxygen Saturation (Exit)  95 %  96 %      Rating of Perceived Exertion (Exercise)  17  17      Perceived Dyspnea (Exercise)  3  3      Duration  Progress to 45 minutes of aerobic exercise without signs/symptoms of physical distress  Progress to 45 minutes of aerobic exercise without signs/symptoms of physical distress      Intensity  THRR unchanged  THRR unchanged        Progression   Progression  Continue to progress workloads to maintain intensity without signs/symptoms of physical distress.  Continue to progress workloads to maintain intensity without signs/symptoms of physical distress.        Resistance Training   Training Prescription  Yes  Yes      Weight  orange bands  orange bands      Reps  10-15  10-15      Time  10 Minutes  10 Minutes        Bike   Level  3.5  3.5      Minutes  17  17        Rower   Level  -  2      Minutes  -  17        Track   Laps  11  13      Minutes  17  17         Exercise Comments: Exercise Comments    Row Name 10/08/17 0853           Exercise Comments  Home exercise completed          Exercise Goals and Review: Exercise Goals    Row Name 09/03/17 1143             Exercise Goals   Increase Physical Activity  Yes       Intervention  Provide advice, education, support and counseling about physical activity/exercise needs.;Develop an individualized exercise prescription for aerobic  and resistive training based on initial evaluation findings, risk stratification, comorbidities and participant's personal goals.       Expected Outcomes  Achievement of increased cardiorespiratory fitness and enhanced flexibility, muscular endurance and strength shown through measurements of functional capacity and personal statement of participant.       Increase Strength and Stamina  Yes       Intervention  Provide advice, education, support and counseling about physical activity/exercise needs.;Develop an individualized exercise prescription for aerobic and resistive training based on initial evaluation findings, risk  stratification, comorbidities and participant's personal goals.       Expected Outcomes  Achievement of increased cardiorespiratory fitness and enhanced flexibility, muscular endurance and strength shown through measurements of functional capacity and personal statement of participant.       Able to understand and use rate of perceived exertion (RPE) scale  Yes       Intervention  Provide education and explanation on how to use RPE scale       Expected Outcomes  Short Term: Able to use RPE daily in rehab to express subjective intensity level;Long Term:  Able to use RPE to guide intensity level when exercising independently       Able to understand and use Dyspnea scale  Yes       Intervention  Provide education and explanation on how to use Dyspnea scale       Expected Outcomes  Short Term: Able to use Dyspnea scale daily in rehab to express subjective sense of shortness of breath during exertion;Long Term: Able to use Dyspnea scale to guide intensity level when exercising independently       Knowledge and understanding of Target Heart Rate Range (THRR)  Yes       Intervention  Provide education and explanation of THRR including how the numbers were predicted and where they are located for reference       Expected Outcomes  Short Term: Able to state/look up THRR;Long Term: Able to use  THRR to govern intensity when exercising independently;Short Term: Able to use daily as guideline for intensity in rehab       Understanding of Exercise Prescription  Yes       Intervention  Provide education, explanation, and written materials on patient's individual exercise prescription       Expected Outcomes  Short Term: Able to explain program exercise prescription;Long Term: Able to explain home exercise prescription to exercise independently          Exercise Goals Re-Evaluation : Exercise Goals Re-Evaluation    Row Name 10/04/17 1649 10/07/17 1646 10/25/17 1554         Exercise Goal Re-Evaluation   Exercise Goals Review  Increase Strength and Stamina;Increase Physical Activity;Able to understand and use Dyspnea scale;Able to understand and use rate of perceived exertion (RPE) scale;Knowledge and understanding of Target Heart Rate Range (THRR);Understanding of Exercise Prescription  Increase Strength and Stamina;Increase Physical Activity;Able to understand and use Dyspnea scale;Able to understand and use rate of perceived exertion (RPE) scale;Knowledge and understanding of Target Heart Rate Range (THRR);Understanding of Exercise Prescription  Increase Strength and Stamina;Increase Physical Activity;Able to understand and use Dyspnea scale;Able to understand and use rate of perceived exertion (RPE) scale;Knowledge and understanding of Target Heart Rate Range (THRR);Understanding of Exercise Prescription     Comments  Patient has only attended 2 exercise sessions. Will cont. to monitor and progress as able.   Patient has only attended three exercise sessions. Will cont. to monitor and progress as able.   Patient is progressing well in rehab. Patient is exercising at home. Will cont. to monitor and progress as able.      Expected Outcomes  Through exercise at rehab and at home, patient will increase strength and stamina and will find that ADL's are easier to preform.   Through exercise at rehab  and at home, patient will increase strength and stamina making ADL's easier to perform. Patient will also have a better understanding of safe exercise and what they are capable to do outside  of clinical supervision.  Through exercise at rehab and at home, patient will increase strength and stamina making ADL's easier to perform. Patient will also have a better understanding of safe exercise and what they are capable to do outside of clinical supervision.        Discharge Exercise Prescription (Final Exercise Prescription Changes): Exercise Prescription Changes - 10/26/17 1500      Response to Exercise   Blood Pressure (Admit)  92/50    Blood Pressure (Exercise)  140/70    Blood Pressure (Exit)  100/56    Heart Rate (Admit)  76 bpm    Heart Rate (Exercise)  93 bpm    Heart Rate (Exit)  69 bpm    Oxygen Saturation (Admit)  94 %    Oxygen Saturation (Exercise)  91 %    Oxygen Saturation (Exit)  96 %    Rating of Perceived Exertion (Exercise)  17    Perceived Dyspnea (Exercise)  3    Duration  Progress to 45 minutes of aerobic exercise without signs/symptoms of physical distress    Intensity  THRR unchanged      Progression   Progression  Continue to progress workloads to maintain intensity without signs/symptoms of physical distress.      Resistance Training   Training Prescription  Yes    Weight  orange bands    Reps  10-15    Time  10 Minutes      Bike   Level  3.5    Minutes  17      Rower   Level  2    Minutes  17      Track   Laps  13    Minutes  17       Nutrition:  Target Goals: Understanding of nutrition guidelines, daily intake of sodium '1500mg'$ , cholesterol '200mg'$ , calories 30% from fat and 7% or less from saturated fats, daily to have 5 or more servings of fruits and vegetables.  Biometrics: Pre Biometrics - 09/03/17 1144      Pre Biometrics   Grip Strength  16 kg        Nutrition Therapy Plan and Nutrition Goals: Nutrition Therapy & Goals - 10/07/17  1445      Nutrition Therapy   Diet  Carb Modified, Mediterranean      Personal Nutrition Goals   Nutrition Goal  Identify food quantities necessary to achieve wt loss of  -2# per week to a goal wt loss of 6-24 lb at graduation from pulmonary rehab.    Personal Goal #2  Pt wants to work toward being off DM medication.      Intervention Plan   Intervention  Prescribe, educate and counsel regarding individualized specific dietary modifications aiming towards targeted core components such as weight, hypertension, lipid management, diabetes, heart failure and other comorbidities.;Nutrition handout(s) given to patient. ADA handouts re: Pre-diabetes and Diabetes    Expected Outcomes  Short Term Goal: Understand basic principles of dietary content, such as calories, fat, sodium, cholesterol and nutrients.;Long Term Goal: Adherence to prescribed nutrition plan.       Nutrition Discharge: Rate Your Plate Scores: Nutrition Assessments - 10/22/17 0909      Rate Your Plate Scores   Pre Score  69       Nutrition Goals Re-Evaluation:   Nutrition Goals Discharge (Final Nutrition Goals Re-Evaluation):   Psychosocial: Target Goals: Acknowledge presence or absence of significant depression and/or stress, maximize coping skills, provide positive support system. Participant is  able to verbalize types and ability to use techniques and skills needed for reducing stress and depression.  Initial Review & Psychosocial Screening: Initial Psych Review & Screening - 09/03/17 1200      Initial Review   Current issues with  None Identified      Family Dynamics   Good Support System?  Yes      Barriers   Psychosocial barriers to participate in program  There are no identifiable barriers or psychosocial needs.       Quality of Life Scores:   PHQ-9: Recent Review Flowsheet Data    Depression screen [facility] 2/9 09/03/2017 08/21/2015 03/04/2015 01/22/2015 10/12/2014   Decreased Interest 0 0 0 0 0   Down,  Depressed, Hopeless 0 0 0 0 0   PHQ - 2 Score 0 0 0 0 0     Interpretation of Total Score  Total Score Depression Severity:  1-4 = Minimal depression, 5-9 = Mild depression, 10-14 = Moderate depression, 15-19 = Moderately severe depression, 20-27 = Severe depression   Psychosocial Evaluation and Intervention: Psychosocial Evaluation - 09/03/17 1200      Psychosocial Evaluation & Interventions   Interventions  Encouraged to exercise with the program and follow exercise prescription    Expected Outcomes  patient will remain free from psychosocial barriers during participation in pulmonary rehab    Continue Psychosocial Services   No Follow up required       Psychosocial Re-Evaluation: Psychosocial Re-Evaluation    [place] Name 10/05/17 0914 10/28/17 5701           Psychosocial Re-Evaluation   Current issues with  None Identified  None Identified      Interventions  Encouraged to attend Cardiac Rehabilitation for the exercise  Encouraged to attend Pulmonary Rehabilitation for the exercise      Continue Psychosocial Services   No Follow up required  No Follow up required         Psychosocial Discharge (Final Psychosocial Re-Evaluation): Psychosocial Re-Evaluation - 10/28/17 0958      Psychosocial Re-Evaluation   Current issues with  None Identified    Interventions  Encouraged to attend Pulmonary Rehabilitation for the exercise    Continue Psychosocial Services   No Follow up required       Education: Education Goals: Education classes will be provided on a weekly basis, covering required topics. Participant will state understanding/return demonstration of topics presented.  Learning Barriers/Preferences: Learning Barriers/Preferences - 09/03/17 1158      Learning Barriers/Preferences   Learning Barriers  None    Learning Preferences  Written Material;Verbal Instruction       Education Topics: Risk Factor Reduction:  -Group instruction that is supported by a PowerPoint  presentation. Instructor discusses the definition of a risk factor, different risk factors for pulmonary disease, and how the heart and lungs work together.     Nutrition for Pulmonary Patient:  -Group instruction provided by PowerPoint slides, verbal discussion, and written materials to support subject matter. The instructor gives an explanation and review of healthy diet recommendations, which includes a discussion on weight management, recommendations for fruit and vegetable consumption, as well as protein, fluid, caffeine, fiber, sodium, sugar, and alcohol. Tips for eating when patients are short of breath are discussed.   Pursed Lip Breathing:  -Group instruction that is supported by demonstration and informational handouts. Instructor discusses the benefits of pursed lip and diaphragmatic breathing and detailed demonstration on how to preform both.     Oxygen Safety:  -Group  instruction provided by PowerPoint, verbal discussion, and written material to support subject matter. There is an overview of "What is Oxygen" and "Why do we need it".  Instructor also reviews how to create a safe environment for oxygen use, the importance of using oxygen as prescribed, and the risks of noncompliance. There is a brief discussion on traveling with oxygen and resources the patient may utilize.   Oxygen Equipment:  -Group instruction provided by [facility] Staff utilizing handouts, written materials, and equipment demonstrations.   PULMONARY REHAB OTHER RESPIRATORY from 10/21/2017 in [place]  Date  10/21/17  Educator  Ace Gins  Instruction Review Code  2- meets goals/outcomes      Signs and Symptoms:  -Group instruction provided by written material and verbal discussion to support subject matter. Warning signs and symptoms of infection, stroke, and heart attack are reviewed and when to call the physician/911 reinforced. Tips for preventing the spread of infection  discussed.   Advanced Directives:  -Group instruction provided by verbal instruction and written material to support subject matter. Instructor reviews Advanced Directive laws and proper instruction for filling out document.   Pulmonary Video:  -Group video education that reviews the importance of medication and oxygen compliance, exercise, good nutrition, pulmonary hygiene, and pursed lip and diaphragmatic breathing for the pulmonary patient.   Exercise for the Pulmonary Patient:  -Group instruction that is supported by a PowerPoint presentation. Instructor discusses benefits of exercise, core components of exercise, frequency, duration, and intensity of an exercise routine, importance of utilizing pulse oximetry during exercise, safety while exercising, and options of places to exercise outside of rehab.     Pulmonary Medications:  -Verbally interactive group education provided by instructor with focus on inhaled medications and proper administration.   Anatomy and Physiology of the Respiratory System and Intimacy:  -Group instruction provided by PowerPoint, verbal discussion, and written material to support subject matter. Instructor reviews respiratory cycle and anatomical components of the respiratory system and their functions. Instructor also reviews differences in obstructive and restrictive respiratory diseases with examples of each. Intimacy, Sex, and Sexuality differences are reviewed with a discussion on how relationships can change when diagnosed with pulmonary disease. Common sexual concerns are reviewed.   MD DAY -A group question and answer session with a medical doctor that allows participants to ask questions that relate to their pulmonary disease state.   OTHER EDUCATION -Group or individual verbal, written, or video instructions that support the educational goals of the pulmonary rehab program.   PULMONARY REHAB OTHER RESPIRATORY from 10/21/2017 in [place]  Date  -- [Holiday Eating]  Educator  Parke Simmers  Instruction Review Code  1- Verbalizes Understanding      Knowledge Questionnaire Score: Knowledge Questionnaire Score - 09/08/17 0944      Knowledge Questionnaire Score   Pre Score  13/13       Core Components/Risk Factors/Patient Goals at Admission: Personal Goals and Risk Factors at Admission - 09/03/17 1159      Core Components/Risk Factors/Patient Goals on Admission    Weight Management  Weight Loss;Yes    Intervention  Weight Management: Develop a combined nutrition and exercise program designed to reach desired caloric intake, while maintaining appropriate intake of nutrient and fiber, sodium and fats, and appropriate energy expenditure required for the weight goal.;Weight Management: Provide education and appropriate resources to help participant work on and attain dietary goals.;Weight Management/Obesity: Establish reasonable short term and long term weight goals.  Expected Outcomes  Short Term: Continue to assess and modify interventions until short term weight is achieved;Weight Loss: Understanding of general recommendations for a balanced deficit meal plan, which promotes 1-2 lb weight loss per week and includes a negative energy balance of [phone] kcal/d;Understanding recommendations for meals to include 15-35% energy as protein, 25-35% energy from fat, 35-60% energy from carbohydrates, less than '200mg'$  of dietary cholesterol, 20-35 gm of total fiber daily;Understanding of distribution of calorie intake throughout the day with the consumption of 4-5 meals/snacks    Improve shortness of breath with ADL's  Yes    Intervention  Provide education, individualized exercise plan and daily activity instruction to help decrease symptoms of SOB with activities of daily living.    Expected Outcomes  Short Term: Achieves a reduction of symptoms when performing activities of daily living.       Core Components/Risk  Factors/Patient Goals Review:  Goals and Risk Factor Review    Row Name 10/05/17 0913 10/28/17 0956           Core Components/Risk Factors/Patient Goals Review   Personal Goals Review  Weight Management/Obesity;Improve shortness of breath with ADL's  Weight Management/Obesity;Improve shortness of breath with ADL's      Review  has only attended 2 exercise sessions, too early to see progression towards goals  Progressing well!!!! She is much stronger than last time she was in the program, she had a hip replacement and is able to exercise with ease now.  No weight loss at this time.      Expected Outcomes  should see more results in the next 30 days  Continued progress toward goals         Core Components/Risk Factors/Patient Goals at Discharge (Final Review):  Goals and Risk Factor Review - 10/28/17 0956      Core Components/Risk Factors/Patient Goals Review   Personal Goals Review  Weight Management/Obesity;Improve shortness of breath with ADL's    Review  Progressing well!!!! She is much stronger than last time she was in the program, she had a hip replacement and is able to exercise with ease now.  No weight loss at this time.    Expected Outcomes  Continued progress toward goals       ITP Comments:   Comments: ITP REVIEW Pt is making expected progress toward pulmonary rehab goals after completing 7 sessions. Recommend continued exercise, life style modification, education, and utilization of breathing techniques to increase stamina and strength and decrease shortness of breath with exertion.

## 2017-10-29 DIAGNOSIS — E119 Type 2 diabetes mellitus without complications: Secondary | ICD-10-CM | POA: Diagnosis not present

## 2017-10-29 DIAGNOSIS — Z961 Presence of intraocular lens: Secondary | ICD-10-CM | POA: Diagnosis not present

## 2017-10-29 DIAGNOSIS — H35372 Puckering of macula, left eye: Secondary | ICD-10-CM | POA: Diagnosis not present

## 2017-10-29 DIAGNOSIS — H5201 Hypermetropia, right eye: Secondary | ICD-10-CM | POA: Diagnosis not present

## 2017-11-02 ENCOUNTER — Encounter (HOSPITAL_COMMUNITY): Payer: Medicare Other

## 2017-11-04 ENCOUNTER — Encounter (HOSPITAL_COMMUNITY)
Admission: RE | Admit: 2017-11-04 | Discharge: 2017-11-04 | Disposition: A | Discharge status: Home / Self Care | Payer: Medicare Other | Source: Ambulatory Visit | Attending: Internal Medicine | Admitting: Internal Medicine

## 2017-11-04 VITALS — Wt 142.0 lb

## 2017-11-04 DIAGNOSIS — J849 Interstitial pulmonary disease, unspecified: Secondary | ICD-10-CM

## 2017-11-04 NOTE — Progress Notes (Signed)
Daily Session Note  Patient Details  Name: Rhonda [name] MRN: [id number] Date of Birth: 01/11/1951 Referring Provider:     Pulmonary Rehab Walk Test from 09/09/2017 in [place]  Referring Provider  Dr. Chase Caller      Encounter Date: 11/04/2017  Check In: Session Check In - 11/04/17 1330      Check-In   Location  [facility]    Staff Present  Su Hilt, MS, ACSM RCEP, Exercise Physiologist;Noa Galvao Rollene Rotunda, RN, Maxcine Ham, RN, BSN    Supervising physician immediately available to respond to emergencies  [facility] immediately available    Physician(s)  Dr. Starla Link    Medication changes reported      No    Fall or balance concerns reported     No    Tobacco Cessation  No Change    Warm-up and Cool-down  Performed as group-led instruction    Resistance Training Performed  Yes    VAD Patient?  No      Pain Assessment   Currently in Pain?  No/denies    Multiple Pain Sites  No       Capillary Blood Glucose: No results found for this or any previous visit (from the past 24 hour(s)). POCT Glucose - 11/04/17 1531      POCT Blood Glucose   Pre-Exercise  169 mg/dL    Post-Exercise  126 mg/dL      Exercise Prescription Changes - 11/04/17 1530      Response to Exercise   Blood Pressure (Admit)  128/60    Blood Pressure (Exercise)  120/50    Blood Pressure (Exit)  90/60    Heart Rate (Admit)  74 bpm    Heart Rate (Exercise)  86 bpm    Heart Rate (Exit)  76 bpm    Oxygen Saturation (Admit)  97 %    Oxygen Saturation (Exercise)  91 %    Oxygen Saturation (Exit)  95 %    Rating of Perceived Exertion (Exercise)  16    Perceived Dyspnea (Exercise)  2    Duration  Progress to 45 minutes of aerobic exercise without signs/symptoms of physical distress    Intensity  THRR unchanged      Progression   Progression  Continue to progress workloads to maintain intensity without signs/symptoms of physical distress.      Resistance Training   Training Prescription  Yes    Weight  orange bands    Reps  10-15    Time  10 Minutes      Bike   Level  3.5    Minutes  17      Rower   Level  2    Minutes  17      Track   Laps  13    Minutes  17       Social History   Tobacco Use  Smoking Status Never Smoker  Smokeless Tobacco Never Used  Tobacco Comment   pt states she experimented in college    Goals Met:  Independence with exercise equipment Improved SOB with ADL's Using PLB without cueing & demonstrates good technique Exercise tolerated well No report of cardiac concerns or symptoms Strength training completed today  Goals Unmet:  Not Applicable  Comments: Service time is from 1330 to 1500   Dr. Rush Farmer is Medical Director for Pulmonary Rehab at [facility].

## 2017-11-04 NOTE — Progress Notes (Signed)
Rhonda [name] 66 y.o. female   DOB: 1951/08/25 MRN: [id number]          Nutrition Note 1. ILD (interstitial lung disease) ([place])    Past Medical History:  Diagnosis Date  . Allergy   . Arthritis    history spinal stenosis. osteoarthritis right hip  . Asthma   . Cataract   . Coronary artery calcification seen on CAT scan 08/19/2017   >300 on CT scan 08/2017  . Depression   . DOE (dyspnea on exertion)    a. 04/2010 Lexi MV EF 71%, no ischemia/infarct;    . GERD (gastroesophageal reflux disease)   . H/O steroid therapy    Steroid use orally over 4 yrs- for Lung Fibrosis  . Heart palpitations 02/28/2015  . Helicobacter pylori ab+   . Hiatal hernia   . High cholesterol   . History of chronic bronchitis    as child  . Hyperlipidemia, mixed 08/19/2017  . Hyperplastic colon polyp 2007  . Inguinal hernia    right  . Insulin resistance    past  . Interstitial lung disease ([place])   . MVP (mitral valve prolapse)    Posterior mitral valve leaflet with mild MR  . Pneumonitis, hypersensitivity ([place])    a. 09/2012 s/p Bx - ? 2/2 bird, mold, oil paint exposure ->on steroids, followed by pulm.  . Pulmonary fibrosis ([facility])    Dr. Chase Caller follows- stable at present  . Pulmonary HTN ([place])    mild PASP 69mmHg on echo 02/2015  . PVC (premature ventricular contraction) 08/19/2017  . Rapid heart rate    Dr. Radford Pax follows- last visit Epic note 9'16   Meds reviewed. Metformin, Fish oil, Prednisone, Calcium Citrate with vitamin D, Tumeric, Probiotic noted  Note Spoke with pt. Pt is overweight.  Pt is making healthy food choices the majority of the time.  Pt's Rate Your Plate results reviewed with pt. Pt is a prednisone-induced diabetic. Pt is concerned re: diagnosis of DM. Pt encouraged to discuss DM dx with PCP. Pt Pt expressed understanding of the information reviewed via feedback method.    Nutrition Diagnosis ? Food-and nutrition-related knowledge deficit related to lack of exposure to  information as related to diagnosis of pulmonary disease ? Overweight related to excessive energy intake as evidenced by a BMI of 28.6  Nutrition Intervention ? Pt's individual nutrition plan and goals reviewed with pt. ? Benefits of adopting healthy eating habits discussed when pt's Rate Your Plate reviewed.  Goal(s) 1. Identify food quantities necessary to achieve wt loss of  -2# per week to a goal wt loss of 6-24 lb at graduation from pulmonary rehab. 2. Pt wants to work toward being off DM medication.  Plan:  Pt to attend Pulmonary Nutrition class Pt attended the Nutrition class re: Holiday Eating Survival tips 09/30/17. Will provide client-centered nutrition education as part of interdisciplinary care.   Monitor and evaluate progress toward nutrition goal with team.  Monitor and Evaluate progress toward nutrition goal with team.   Derek Mound, M.Ed, RD, LDN, CDE 11/04/2017 3:31 PM

## 2017-11-08 ENCOUNTER — Other Ambulatory Visit: Payer: Medicare Other | Admitting: *Deleted

## 2017-11-08 DIAGNOSIS — E782 Mixed hyperlipidemia: Secondary | ICD-10-CM

## 2017-11-08 LAB — LIPID PANEL
CHOL/HDL RATIO: 2.5 ratio (ref 0.0–4.4)
CHOLESTEROL TOTAL: 180 mg/dL (ref 100–199)
HDL: 71 mg/dL (ref >39)
LDL Calculated: 77 mg/dL (ref 0–99)
TRIGLYCERIDES: 158 mg/dL — AB (ref 0–149)
VLDL Cholesterol Cal: 32 mg/dL (ref 5–40)

## 2017-11-09 ENCOUNTER — Encounter (HOSPITAL_COMMUNITY)
Admission: RE | Admit: 2017-11-09 | Discharge: 2017-11-09 | Disposition: A | Discharge status: Home / Self Care | Payer: Medicare Other | Source: Ambulatory Visit | Attending: Internal Medicine | Admitting: Internal Medicine

## 2017-11-09 VITALS — Wt 143.3 lb

## 2017-11-09 DIAGNOSIS — J849 Interstitial pulmonary disease, unspecified: Secondary | ICD-10-CM

## 2017-11-09 NOTE — Progress Notes (Signed)
Daily Session Note  Patient Details  Name: Rhonda [name] MRN: [id number] Date of Birth: Jan 17, 1951 Referring Provider:     Pulmonary Rehab Walk Test from 09/09/2017 in [place]  Referring Provider  Dr. Chase Caller      Encounter Date: 11/09/2017  Check In: Session Check In - 11/09/17 1516      Check-In   Location  [facility]    Staff Present  Rodney Langton, RN;Portia Rollene Rotunda, RN, Maxcine Ham, RN, BSN;Kimberlie Csaszar, MS, ACSM RCEP, Exercise Physiologist    Supervising physician immediately available to respond to emergencies  [facility] immediately available    Physician(s)  Dr. Lucianne Lei    Medication changes reported      No    Fall or balance concerns reported     No    Tobacco Cessation  No Change    Warm-up and Cool-down  Performed as group-led instruction    Resistance Training Performed  Yes    VAD Patient?  No      Pain Assessment   Currently in Pain?  No/denies    Multiple Pain Sites  No       Capillary Blood Glucose: No results found for this or any previous visit (from the past 24 hour(s)).  Exercise Prescription Changes - 11/09/17 1500      Response to Exercise   Blood Pressure (Admit)  100/54    Blood Pressure (Exit)  102/54    Heart Rate (Admit)  72 bpm    Heart Rate (Exercise)  93 bpm    Heart Rate (Exit)  80 bpm    Oxygen Saturation (Admit)  93 %    Oxygen Saturation (Exercise)  91 %    Oxygen Saturation (Exit)  94 %    Rating of Perceived Exertion (Exercise)  16    Perceived Dyspnea (Exercise)  2    Duration  Progress to 45 minutes of aerobic exercise without signs/symptoms of physical distress    Intensity  THRR unchanged      Progression   Progression  Continue to progress workloads to maintain intensity without signs/symptoms of physical distress.      Resistance Training   Training Prescription  Yes    Weight  orange bands    Reps  10-15    Time  10 Minutes      Bike   Level   3.5    Minutes  17      Rower   Level  2    Minutes  17      Track   Laps  14    Minutes  17       Social History   Tobacco Use  Smoking Status Never Smoker  Smokeless Tobacco Never Used  Tobacco Comment   pt states she experimented in college    Goals Met:  Exercise tolerated well No report of cardiac concerns or symptoms Strength training completed today  Goals Unmet:  Not Applicable  Comments: Service time is from 1:30p to 3:15p    Dr. Rush Farmer is Medical Director for Pulmonary Rehab at [facility].

## 2017-11-11 ENCOUNTER — Encounter (HOSPITAL_COMMUNITY)
Admission: RE | Admit: 2017-11-11 | Discharge: 2017-11-11 | Disposition: A | Discharge status: Home / Self Care | Payer: Medicare Other | Source: Ambulatory Visit | Attending: Internal Medicine | Admitting: Internal Medicine

## 2017-11-11 VITALS — Wt 144.0 lb

## 2017-11-11 DIAGNOSIS — J849 Interstitial pulmonary disease, unspecified: Secondary | ICD-10-CM

## 2017-11-11 NOTE — Progress Notes (Signed)
Daily Session Note  Patient Details  Name: Rhonda [name] MRN: [id number] Date of Birth: 08-09-1951 Referring Provider:     Pulmonary Rehab Walk Test from 09/09/2017 in [place]  Referring Provider  Dr. Chase Caller      Encounter Date: 11/11/2017  Check In: Session Check In - 11/11/17 1335      Check-In   Location  [facility]    Staff Present  Su Hilt, MS, ACSM RCEP, Exercise Physiologist;[name] Ysidro Evert, RN;Sunni Richardson Rollene Rotunda, RN, BSN    Supervising physician immediately available to respond to emergencies  [facility] immediately available    Physician(s)  Dr. Wendee Beavers    Medication changes reported      No    Fall or balance concerns reported     No    Tobacco Cessation  No Change    Warm-up and Cool-down  Performed as group-led instruction    Resistance Training Performed  Yes    VAD Patient?  No      Pain Assessment   Currently in Pain?  No/denies    Multiple Pain Sites  No       Capillary Blood Glucose: No results found for this or any previous visit (from the past 24 hour(s)).  Exercise Prescription Changes - 11/11/17 1614      Response to Exercise   Blood Pressure (Admit)  100/54    Blood Pressure (Exercise)  130/52    Blood Pressure (Exit)  110/64    Heart Rate (Admit)  74 bpm    Heart Rate (Exercise)  93 bpm    Heart Rate (Exit)  78 bpm    Oxygen Saturation (Admit)  94 %    Oxygen Saturation (Exercise)  88 %    Oxygen Saturation (Exit)  96 %    Rating of Perceived Exertion (Exercise)  16    Perceived Dyspnea (Exercise)  3    Duration  Progress to 45 minutes of aerobic exercise without signs/symptoms of physical distress    Intensity  THRR unchanged      Progression   Progression  Continue to progress workloads to maintain intensity without signs/symptoms of physical distress.      Resistance Training   Training Prescription  Yes    Weight  orange bands    Reps  10-15    Time  10 Minutes      Bike   Level  3.5    Minutes  17      Rower   Level  2    Minutes  17       Social History   Tobacco Use  Smoking Status Never Smoker  Smokeless Tobacco Never Used  Tobacco Comment   pt states she experimented in college    Goals Met:  Independence with exercise equipment Improved SOB with ADL's Using PLB without cueing & demonstrates good technique Exercise tolerated well No report of cardiac concerns or symptoms Strength training completed today  Goals Unmet:  Not Applicable  Comments: Service time is from 1330 to 1540   Dr. Rush Farmer is Medical Director for Pulmonary Rehab at [facility].

## 2017-11-15 NOTE — Progress Notes (Signed)
Called to patient to review lab results for the third time. Phone is going straight to voicemail. Letter with results sent to patient's myChart.

## 2017-11-18 ENCOUNTER — Encounter (HOSPITAL_COMMUNITY)
Admission: RE | Admit: 2017-11-18 | Discharge: 2017-11-18 | Disposition: A | Discharge status: Home / Self Care | Payer: Medicare Other | Source: Ambulatory Visit | Attending: Internal Medicine | Admitting: Internal Medicine

## 2017-11-18 DIAGNOSIS — J849 Interstitial pulmonary disease, unspecified: Secondary | ICD-10-CM

## 2017-11-18 NOTE — Progress Notes (Signed)
Pulmonary Individual Treatment Plan  Patient Details  Name: HALIYAH FRYMAN MRN: [id number] Date of Birth: 1951/09/15 Referring Provider:     Pulmonary Rehab Walk Test from 09/09/2017 in [facility]  Referring Provider  Dr. Chase Caller      Initial Encounter Date:    Pulmonary Rehab Walk Test from 09/09/2017 in [facility]  Date  09/09/17  Referring Provider  Dr. Chase Caller      Visit Diagnosis: ILD (interstitial lung disease) ([employer])  Patient's Home Medications on Admission:   Current Outpatient Medications:  .  acetaminophen (TYLENOL) 325 MG tablet, Take 650 mg by mouth every 6 (six) hours as needed. Reported on 02/10/2016, Disp: , Rfl:  .  albuterol (PROAIR HFA) 108 (90 Base) MCG/ACT inhaler, Inhale 2 puffs into the lungs every 4 (four) hours as needed for wheezing. Or coughing spells.  You may use 2 Puffs 5-10 minutes before exercise., Disp: 1 Inhaler, Rfl: 3 .  Alpha-D-Galactosidase (BEANO PO), Take 1 tablet by mouth as needed (if eating onion or garlic). , Disp: , Rfl:  .  AMBULATORY NON FORMULARY MEDICATION, Cinnsulin cinnamon caplsule 1 by mouth daily, Disp: , Rfl:  .  Ascorbic Acid (VITAMIN C) 1000 MG tablet, Take 1,000 mg by mouth daily., Disp: , Rfl:  .  BREO ELLIPTA 100-25 MCG/INH AEPB, TAKE 1 PUFF BY MOUTH EVERY DAY, Disp: 180 each, Rfl: 1 .  Calcium Citrate-Vitamin D (CALCITRATE PLUS D PO), Take 1 tablet by mouth daily., Disp: , Rfl:  .  Calcium-Vitamin D-Vitamin K (VIACTIV) 256-389-37 MG-UNT-MCG CHEW, Chew 1 tablet by mouth daily., Disp: , Rfl:  .  chlorpheniramine (CHLOR-TRIMETON) 4 MG tablet, Take 4 mg by mouth daily as needed for allergies. , Disp: , Rfl:  .  dicyclomine (BENTYL) 10 MG capsule, Take 10 mg by mouth daily. Reported on 01/06/2016, Disp: , Rfl:  .  ezetimibe (ZETIA) 10 MG tablet, Take 1 tablet (10 mg total) by mouth daily., Disp: 30 tablet, Rfl: 11 .  fluticasone (FLONASE) 50 MCG/ACT nasal spray,  SPRAY 2 SPRAYS INTO EACH NOSTRIL EVERY DAY, Disp: 1 g, Rfl: 12 .  metFORMIN (GLUCOPHAGE-XR) 500 MG 24 hr tablet, Take 500 mg by mouth daily., Disp: , Rfl:  .  metoprolol succinate (TOPROL-XL) 50 MG 24 hr tablet, TAKE 1 TABLET BY MOUTH EVERY DAY IMMEDIATELY FOLLOWING A MEAL. (Patient taking differently: Take 25 mg by mouth daily. TAKE 1 TABLET BY MOUTH EVERY DAY IMMEDIATELY FOLLOWING A MEAL.), Disp: 30 tablet, Rfl: 0 .  montelukast (SINGULAIR) 10 MG tablet, Take 1 tablet (10 mg total) by mouth daily., Disp: 30 tablet, Rfl: 0 .  Multiple Vitamin (MULTIVITAMIN) tablet, Take 1 tablet by mouth daily., Disp: , Rfl:  .  Naproxen Sodium (ALEVE) 220 MG CAPS, Take 1-2 capsules by mouth as needed., Disp: , Rfl:  .  Omega-3 Fatty Acids (FISH OIL) 1000 MG CAPS, Take 1,000 mg by mouth daily., Disp: , Rfl:  .  omeprazole (PRILOSEC) 20 MG capsule, Take 1 capsule (20 mg total) by mouth daily. (Patient taking differently: Take 20 mg by mouth daily. Patient takes 10 mg), Disp: 30 capsule, Rfl: 5 .  pravastatin (PRAVACHOL) 40 MG tablet, Take 40 mg by mouth daily., Disp: , Rfl:  .  predniSONE (DELTASONE) 10 MG tablet, Take 1 tablet (10 mg total) daily with breakfast by mouth., Disp: 60 tablet, Rfl: 2 .  predniSONE (DELTASONE) 5 MG tablet, Take 5 mg by mouth daily with breakfast., Disp: ,  Rfl:  .  Probiotic Product (PROBIOTIC ADVANCED PO), Take 1 capsule by mouth daily. , Disp: , Rfl:  .  Turmeric 500 MG CAPS, Take 1 capsule by mouth daily., Disp: , Rfl:   Past Medical History: Past Medical History:  Diagnosis Date  . Allergy   . Arthritis    history spinal stenosis. osteoarthritis right hip  . Asthma   . Cataract   . Coronary artery calcification seen on CAT scan 08/19/2017   >300 on CT scan 08/2017  . Depression   . DOE (dyspnea on exertion)    a. 04/2010 Lexi MV EF 71%, no ischemia/infarct;    . GERD (gastroesophageal reflux disease)   . H/O steroid therapy    Steroid use orally over 4 yrs- for Lung  Fibrosis  . Heart palpitations 02/28/2015  . Helicobacter pylori ab+   . Hiatal hernia   . High cholesterol   . History of chronic bronchitis    as child  . Hyperlipidemia, mixed 08/19/2017  . Hyperplastic colon polyp 2007  . Inguinal hernia    right  . Insulin resistance    past  . Interstitial lung disease ([place])   . MVP (mitral valve prolapse)    Posterior mitral valve leaflet with mild MR  . Pneumonitis, hypersensitivity ([place])    a. 09/2012 s/p Bx - ? 2/2 bird, mold, oil paint exposure ->on steroids, followed by pulm.  . Pulmonary fibrosis ([facility])    Dr. Chase Caller follows- stable at present  . Pulmonary HTN ([place])    mild PASP 80mHg on echo 02/2015  . PVC (premature ventricular contraction) 08/19/2017  . Rapid heart rate    Dr. TRadford Paxfollows- last visit Epic note 9'16    Tobacco Use: Social History   Tobacco Use  Smoking Status Never Smoker  Smokeless Tobacco Never Used  Tobacco Comment   pt states she experimented in college    Labs: Recent Review Flowsheet Data    Labs for ITP Cardiac and Pulmonary Rehab Latest Ref Rng & Units 10/12/2014 01/22/2015 02/26/2015 08/21/2015 11/08/2017   Cholestrol 100 - 199 mg/dL 176 - 184 - 180   LDLCALC 0 - 99 mg/dL 79 - 94 - 77   HDL >39 mg/dL 50 - 53 - 71   Trlycerides 0 - 149 mg/dL 236(H) - 185(H) - 158(H)   Hemoglobin A1c <5.7 % - 5.9 - 6.1(H) -   PHART 7.350 - 7.450 - - - - -   PCO2ART 35.0 - 45.0 mmHg - - - - -   HCO3 20.0 - 24.0 mEq/L - - - - -   TCO2 0 - 100 mmol/L - - - - -   ACIDBASEDEF 0.0 - 2.0 mmol/L - - - - -   O2SAT % - - - - -      Capillary Blood Glucose: Lab Results  Component Value Date   GLUCAP 110 (H) 10/01/2012   POCT Glucose    Row Name 09/28/17 1523 09/30/17 1549 10/12/17 1522 10/26/17 1502 10/28/17 1609     POCT Blood Glucose   Pre-Exercise  157 mg/dL  152 mg/dL  125 mg/dL  143 mg/dL  208 mg/dL   Post-Exercise  136 mg/dL  109 mg/dL  98 mg/dL  95 mg/dL  108 mg/dL   Row Name 11/04/17 1531              POCT Blood Glucose   Pre-Exercise  169 mg/dL       Post-Exercise  126 mg/dL  Pulmonary Assessment Scores: Pulmonary Assessment Scores    Row Name 09/08/17 0944 09/09/17 1632       ADL UCSD   ADL Phase  Entry  -    SOB Score total  21  -      CAT Score   CAT Score  13 Entry  -      mMRC Score   mMRC Score  -  1       Pulmonary Function Assessment: Pulmonary Function Assessment - 09/03/17 1158      Breath   Bilateral Breath Sounds  Clear    Shortness of Breath  Yes;Limiting activity extreme exertion or high elevations       Exercise Target Goals:    Exercise Program Goal: Individual exercise prescription set with THRR, safety & activity barriers. Participant demonstrates ability to understand and report RPE using BORG scale, to self-measure pulse accurately, and to acknowledge the importance of the exercise prescription.  Exercise Prescription Goal: Starting with aerobic activity 30 plus minutes a day, 3 days per week for initial exercise prescription. Provide home exercise prescription and guidelines that participant acknowledges understanding prior to discharge.  Activity Barriers & Risk Stratification: Activity Barriers & Cardiac Risk Stratification - 09/03/17 1138      Activity Barriers & Cardiac Risk Stratification   Activity Barriers  Right Hip Replacement;Shortness of Breath       6 Minute Walk: 6 Minute Walk    Row Name 09/09/17 1616         6 Minute Walk   Phase  Initial     Distance  1200 feet     Walk Time  6 minutes     # of Rest Breaks  0     MPH  2.27     METS  3.07     RPE  10     Perceived Dyspnea   1     Symptoms  No     Resting HR  87 bpm     Resting BP  130/78     Resting Oxygen Saturation   95 %     Exercise Oxygen Saturation  during 6 min walk  88 %     Max Ex. HR  109 bpm     Max Ex. BP  148/79       Interval HR   1 Minute HR  87     2 Minute HR  100     3 Minute HR  105     4 Minute HR  109     5 Minute HR   105     6 Minute HR  105     2 Minute Post HR  88     Interval Heart Rate?  Yes       Interval Oxygen   Interval Oxygen?  Yes     Baseline Oxygen Saturation %  95 %     1 Minute Oxygen Saturation %  93 %     1 Minute Liters of Oxygen  0 L     2 Minute Oxygen Saturation %  90 %     2 Minute Liters of Oxygen  0 L     3 Minute Oxygen Saturation %  89 %     3 Minute Liters of Oxygen  0 L     4 Minute Oxygen Saturation %  88 %     4 Minute Liters of Oxygen  0 L     5  Minute Oxygen Saturation %  89 %     5 Minute Liters of Oxygen  0 L     6 Minute Oxygen Saturation %  90 %     6 Minute Liters of Oxygen  0 L     2 Minute Post Oxygen Saturation %  92 %     2 Minute Post Liters of Oxygen  0 L        Oxygen Initial Assessment: Oxygen Initial Assessment - 09/09/17 1631      Initial 6 min Walk   Oxygen Used  None      Program Oxygen Prescription   Program Oxygen Prescription  None       Oxygen Re-Evaluation: Oxygen Re-Evaluation    Row Name 10/05/17 4315 10/28/17 0954 11/12/17 0907         Program Oxygen Prescription   Program Oxygen Prescription  None  None  None       Home Oxygen   Home Oxygen Device  Portable Concentrator;Home Concentrator  Portable Concentrator;Home Concentrator  Portable Concentrator;Home Concentrator does not need oxygen, but has the equipment from past use     Sleep Oxygen Prescription  None  None  None     Home Exercise Oxygen Prescription  None has not required oxygen while exercising in pulmonary rehab  None  None     Liters per minute  -  0  0     Home at Rest Exercise Oxygen Prescription  None  None  None     Compliance with Home Oxygen Use  Yes  -  -       Goals/Expected Outcomes   Short Term Goals  To learn and exhibit compliance with exercise, home and travel O2 prescription  -  -     Long  Term Goals  Exhibits compliance with exercise, home and travel O2 prescription;Verbalizes importance of monitoring SPO2 with pulse oximeter and return  demonstration;Exhibits proper breathing techniques, such as pursed lip breathing or other method taught during program session;Maintenance of O2 saturations>88%;Compliance with respiratory medication;Demonstrates proper use of MDI's  -  -     Comments  -  Is not using oxygen now, but did require it n the past  -        Oxygen Discharge (Final Oxygen Re-Evaluation): Oxygen Re-Evaluation - 11/12/17 0907      Program Oxygen Prescription   Program Oxygen Prescription  None      Home Oxygen   Home Oxygen Device  Portable Concentrator;Home Concentrator does not need oxygen, but has the equipment from past use    Sleep Oxygen Prescription  None    Home Exercise Oxygen Prescription  None    Liters per minute  0    Home at Rest Exercise Oxygen Prescription  None       Initial Exercise Prescription: Initial Exercise Prescription - 09/09/17 1600      Date of Initial Exercise RX and Referring Provider   Date  09/09/17    Referring Provider  Dr. Chase Caller      Bike   Level  0.4    Minutes  17      Rower   Level  1    Minutes  17      Track   Laps  10    Minutes  17      Prescription Details   Frequency (times per week)  2    Duration  Progress to 45 minutes of aerobic exercise without signs/symptoms  of physical distress      Intensity   THRR 40-80% of Max Heartrate  61-122    Ratings of Perceived Exertion  11-13    Perceived Dyspnea  0-4      Progression   Progression  Continue progressive overload as per policy without signs/symptoms or physical distress.      Resistance Training   Training Prescription  Yes    Weight  orange bands    Reps  10-15       Perform Capillary Blood Glucose checks as needed.  Exercise Prescription Changes: Exercise Prescription Changes    Row Name 09/28/17 1500 09/30/17 1548 10/07/17 1500 10/12/17 1500 10/19/17 1541     Response to Exercise   Blood Pressure (Admit)  104/48  102/50  100/54  108/56  98/56   Blood Pressure (Exercise)   146/54  138/74  142/60  130/64  110/46   Blood Pressure (Exit)  90/46  118/64  114/60  120/64  110/54   Heart Rate (Admit)  91 bpm  77 bpm  72 bpm  72 bpm  80 bpm   Heart Rate (Exercise)  105 bpm  92 bpm  105 bpm  99 bpm  96 bpm   Heart Rate (Exit)  89 bpm  84 bpm  82 bpm  70 bpm  85 bpm   Oxygen Saturation (Admit)  92 %  93 %  95 %  94 %  95 %   Oxygen Saturation (Exercise)  90 %  92 %  89 %  92 %  92 %   Oxygen Saturation (Exit)  95 %  92 %  95 %  95 %  92 %   Rating of Perceived Exertion (Exercise)  _0 Perceived Dyspnea (Exercise)  2  1  0  3  2   Duration  Progress to 45 minutes of aerobic exercise without signs/symptoms of physical distress  Progress to 45 minutes of aerobic exercise without signs/symptoms of physical distress  Progress to 45 minutes of aerobic exercise without signs/symptoms of physical distress  Progress to 45 minutes of aerobic exercise without signs/symptoms of physical distress  Progress to 45 minutes of aerobic exercise without signs/symptoms of physical distress   Intensity  Other (comment) 40-80% of HRR  Other (comment) 40-80% of HRR  THRR unchanged  THRR unchanged  THRR unchanged     Progression   Progression  Continue to progress workloads to maintain intensity without signs/symptoms of physical distress.  Continue to progress workloads to maintain intensity without signs/symptoms of physical distress.  Continue to progress workloads to maintain intensity without signs/symptoms of physical distress.  Continue to progress workloads to maintain intensity without signs/symptoms of physical distress.  Continue to progress workloads to maintain intensity without signs/symptoms of physical distress.     Resistance Training   Training Prescription  Yes  Yes  Yes  Yes  Yes   Weight  orange bands  orange bands  orange bands  orange bands  orange bands   Reps  10-15  10-15  10-15  10-15  10-15   Time  10 Minutes  10 Minutes  10 Minutes  10 Minutes  10  Minutes     Bike   Level  3  -  3  3.5  3.5   Minutes  17  -  _1 Rower   Level  1  1  _0 Minutes  _1 Track   Laps  _2 Minutes  _3 Home Exercise Plan   Plans to [facility] exercise at  [facility] (comment)  -  -   Frequency  -  -  Add 3 additional days to program exercise sessions.  -  -   Row Name 10/21/17 1600 10/26/17 1500 10/28/17 1608 11/04/17 1530 11/09/17 1500     Response to Exercise   Blood Pressure (Admit)  106/56  92/50  108/50  128/60  100/54   Blood Pressure (Exercise)  120/62  140/70  146/66  120/50  -   Blood Pressure (Exit)  102/60  100/56  102/70  90/60  102/54   Heart Rate (Admit)  71 bpm  76 bpm  75 bpm  74 bpm  72 bpm   Heart Rate (Exercise)  92 bpm  93 bpm  97 bpm  86 bpm  93 bpm   Heart Rate (Exit)  75 bpm  69 bpm  82 bpm  76 bpm  80 bpm   Oxygen Saturation (Admit)  95 %  94 %  93 %  97 %  93 %   Oxygen Saturation (Exercise)  91 %  91 %  92 %  91 %  91 %   Oxygen Saturation (Exit)  95 %  96 %  94 %  95 %  94 %   Rating of Perceived Exertion (Exercise)  _4 Perceived Dyspnea (Exercise)  _5 Duration  Progress to 45 minutes of aerobic exercise without signs/symptoms of physical distress  Progress to 45 minutes of aerobic exercise without signs/symptoms of physical distress  Progress to 45 minutes of aerobic exercise without signs/symptoms of physical distress  Progress to 45 minutes of aerobic exercise without signs/symptoms of physical distress  Progress to 45 minutes of aerobic exercise without signs/symptoms of physical distress   Intensity  THRR unchanged  THRR unchanged  THRR unchanged  THRR unchanged  THRR unchanged     Progression   Progression  Continue to progress workloads to maintain intensity without signs/symptoms of physical distress.  Continue to progress workloads to maintain intensity without signs/symptoms of physical distress.   Continue to progress workloads to maintain intensity without signs/symptoms of physical distress.  Continue to progress workloads to maintain intensity without signs/symptoms of physical distress.  Continue to progress workloads to maintain intensity without signs/symptoms of physical distress.     Resistance Training   Training Prescription  Yes  Yes  Yes  Yes  Yes   Weight  orange bands  orange bands  orange bands  orange bands  orange bands   Reps  10-15  10-15  10-15  10-15  10-15   Time  10 Minutes  10 Minutes  10 Minutes  10 Minutes  10 Minutes     Bike   Level  3.5  3.5  3.5  3.5  3.5   Minutes  _6 Rower   Level  -  2  -  2  2   Minutes  -  17  -  17  17     Track   Laps  _0 Minutes  _1 Row Name 11/11/17 1614             Response to Exercise   Blood Pressure (Admit)  100/54       Blood Pressure (Exercise)  130/52       Blood Pressure (Exit)  110/64       Heart Rate (Admit)  74 bpm       Heart Rate (Exercise)  93 bpm       Heart Rate (Exit)  78 bpm       Oxygen Saturation (Admit)  94 %       Oxygen Saturation (Exercise)  88 %       Oxygen Saturation (Exit)  96 %       Rating of Perceived Exertion (Exercise)  16       Perceived Dyspnea (Exercise)  3       Duration  Progress to 45 minutes of aerobic exercise without signs/symptoms of physical distress       Intensity  THRR unchanged         Progression   Progression  Continue to progress workloads to maintain intensity without signs/symptoms of physical distress.         Resistance Training   Training Prescription  Yes       Weight  orange bands       Reps  10-15       Time  10 Minutes         Bike   Level  3.5       Minutes  17         Rower   Level  2       Minutes  17          Exercise Comments: Exercise Comments    Row Name 10/08/17 0853           Exercise Comments  Home exercise completed          Exercise Goals and Review: Exercise  Goals    Row Name 09/03/17 1143             Exercise Goals   Increase Physical Activity  Yes       Intervention  Provide advice, education, support and counseling about physical activity/exercise needs.;Develop an individualized exercise prescription for aerobic and resistive training based on initial evaluation findings, risk stratification, comorbidities and participant's personal goals.       Expected Outcomes  Achievement of increased cardiorespiratory fitness and enhanced flexibility, muscular endurance and strength shown through measurements of functional capacity and personal statement of participant.       Increase Strength and Stamina  Yes       Intervention  Provide advice, education, support and counseling about physical activity/exercise needs.;Develop an individualized exercise prescription for aerobic and resistive training based on initial evaluation findings, risk stratification, comorbidities and participant's personal goals.       Expected Outcomes  Achievement of increased cardiorespiratory fitness and enhanced flexibility, muscular endurance and strength shown through measurements of functional capacity and personal statement of participant.       Able to understand and use rate of perceived exertion (RPE) scale  Yes       Intervention  Provide education and explanation on how to use RPE scale  Expected Outcomes  Short Term: Able to use RPE daily in rehab to express subjective intensity level;Long Term:  Able to use RPE to guide intensity level when exercising independently       Able to understand and use Dyspnea scale  Yes       Intervention  Provide education and explanation on how to use Dyspnea scale       Expected Outcomes  Short Term: Able to use Dyspnea scale daily in rehab to express subjective sense of shortness of breath during exertion;Long Term: Able to use Dyspnea scale to guide intensity level when exercising independently       Knowledge and understanding  of Target Heart Rate Range (THRR)  Yes       Intervention  Provide education and explanation of THRR including how the numbers were predicted and where they are located for reference       Expected Outcomes  Short Term: Able to state/look up THRR;Long Term: Able to use THRR to govern intensity when exercising independently;Short Term: Able to use daily as guideline for intensity in rehab       Understanding of Exercise Prescription  Yes       Intervention  Provide education, explanation, and written materials on patient's individual exercise prescription       Expected Outcomes  Short Term: Able to explain program exercise prescription;Long Term: Able to explain home exercise prescription to exercise independently          Exercise Goals Re-Evaluation : Exercise Goals Re-Evaluation    Row Name 10/04/17 1649 10/07/17 1646 10/25/17 1554 11/11/17 1020       Exercise Goal Re-Evaluation   Exercise Goals Review  Increase Strength and Stamina;Increase Physical Activity;Able to understand and use Dyspnea scale;Able to understand and use rate of perceived exertion (RPE) scale;Knowledge and understanding of Target Heart Rate Range (THRR);Understanding of Exercise Prescription  Increase Strength and Stamina;Increase Physical Activity;Able to understand and use Dyspnea scale;Able to understand and use rate of perceived exertion (RPE) scale;Knowledge and understanding of Target Heart Rate Range (THRR);Understanding of Exercise Prescription  Increase Strength and Stamina;Increase Physical Activity;Able to understand and use Dyspnea scale;Able to understand and use rate of perceived exertion (RPE) scale;Knowledge and understanding of Target Heart Rate Range (THRR);Understanding of Exercise Prescription  Increase Strength and Stamina;Increase Physical Activity;Able to understand and use rate of perceived exertion (RPE) scale;Knowledge and understanding of Target Heart Rate Range (THRR);Understanding of Exercise  Prescription;Able to understand and use Dyspnea scale    Comments  Patient has only attended 2 exercise sessions. Will cont. to monitor and progress as able.   Patient has only attended three exercise sessions. Will cont. to monitor and progress as able.   Patient is progressing well in rehab. Patient is exercising at home. Will cont. to monitor and progress as able.   Patient is progressing well in rehab. Patient is exercising at home. Will cont. to monitor and progress as able.     Expected Outcomes  Through exercise at rehab and at home, patient will increase strength and stamina and will find that ADL's are easier to preform.   Through exercise at rehab and at home, patient will increase strength and stamina making ADL's easier to perform. Patient will also have a better understanding of safe exercise and what they are capable to do outside of clinical supervision.  Through exercise at rehab and at home, patient will increase strength and stamina making ADL's easier to perform. Patient will also have a better  understanding of safe exercise and what they are capable to do outside of clinical supervision.  Through exercise at rehab and at home, patient will increase strength and stamina making ADL's easier to perform. Patient will also have a better understanding of safe exercise and what they are capable to do outside of clinical supervision.       Discharge Exercise Prescription (Final Exercise Prescription Changes): Exercise Prescription Changes - 11/11/17 1614      Response to Exercise   Blood Pressure (Admit)  100/54    Blood Pressure (Exercise)  130/52    Blood Pressure (Exit)  110/64    Heart Rate (Admit)  74 bpm    Heart Rate (Exercise)  93 bpm    Heart Rate (Exit)  78 bpm    Oxygen Saturation (Admit)  94 %    Oxygen Saturation (Exercise)  88 %    Oxygen Saturation (Exit)  96 %    Rating of Perceived Exertion (Exercise)  16    Perceived Dyspnea (Exercise)  3    Duration  Progress to 45  minutes of aerobic exercise without signs/symptoms of physical distress    Intensity  THRR unchanged      Progression   Progression  Continue to progress workloads to maintain intensity without signs/symptoms of physical distress.      Resistance Training   Training Prescription  Yes    Weight  orange bands    Reps  10-15    Time  10 Minutes      Bike   Level  3.5    Minutes  17      Rower   Level  2    Minutes  17       Nutrition:  Target Goals: Understanding of nutrition guidelines, daily intake of sodium '1500mg'$ , cholesterol '200mg'$ , calories 30% from fat and 7% or less from saturated fats, daily to have 5 or more servings of fruits and vegetables.  Biometrics: Pre Biometrics - 09/03/17 1144      Pre Biometrics   Grip Strength  16 kg        Nutrition Therapy Plan and Nutrition Goals: Nutrition Therapy & Goals - 10/07/17 1445      Nutrition Therapy   Diet  Carb Modified, Mediterranean      Personal Nutrition Goals   Nutrition Goal  Identify food quantities necessary to achieve wt loss of  -2# per week to a goal wt loss of 6-24 lb at graduation from pulmonary rehab.    Personal Goal #2  Pt wants to work toward being off DM medication.      Intervention Plan   Intervention  Prescribe, educate and counsel regarding individualized specific dietary modifications aiming towards targeted core components such as weight, hypertension, lipid management, diabetes, heart failure and other comorbidities.;Nutrition handout(s) given to patient. ADA handouts re: Pre-diabetes and Diabetes    Expected Outcomes  Short Term Goal: Understand basic principles of dietary content, such as calories, fat, sodium, cholesterol and nutrients.;Long Term Goal: Adherence to prescribed nutrition plan.       Nutrition Discharge: Rate Your Plate Scores: Nutrition Assessments - 10/22/17 0909      Rate Your Plate Scores   Pre Score  69       Nutrition Goals Re-Evaluation:   Nutrition Goals  Discharge (Final Nutrition Goals Re-Evaluation):   Psychosocial: Target Goals: Acknowledge presence or absence of significant depression and/or stress, maximize coping skills, provide positive support system. Participant is able to verbalize types and  ability to use techniques and skills needed for reducing stress and depression.  Initial Review & Psychosocial Screening: Initial Psych Review & Screening - 09/03/17 1200      Initial Review   Current issues with  None Identified      Family Dynamics   Good Support System?  Yes      Barriers   Psychosocial barriers to participate in program  There are no identifiable barriers or psychosocial needs.       Quality of Life Scores:   PHQ-9: Recent Review Flowsheet Data    Depression screen [facility] 2/9 09/03/2017 08/21/2015 03/04/2015 01/22/2015 10/12/2014   Decreased Interest 0 0 0 0 0   Down, Depressed, Hopeless 0 0 0 0 0   PHQ - 2 Score 0 0 0 0 0     Interpretation of Total Score  Total Score Depression Severity:  1-4 = Minimal depression, 5-9 = Mild depression, 10-14 = Moderate depression, 15-19 = Moderately severe depression, 20-27 = Severe depression   Psychosocial Evaluation and Intervention: Psychosocial Evaluation - 09/03/17 1200      Psychosocial Evaluation & Interventions   Interventions  Encouraged to exercise with the program and follow exercise prescription    Expected Outcomes  patient will remain free from psychosocial barriers during participation in pulmonary rehab    Continue Psychosocial Services   No Follow up required       Psychosocial Re-Evaluation: Psychosocial Re-Evaluation    [place] Name 10/05/17 0914 10/28/17 0958 11/12/17 0910         Psychosocial Re-Evaluation   Current issues with  None Identified  None Identified  None Identified     Expected Outcomes  -  -  no barriers for participation in program     Interventions  Encouraged to attend Cardiac Rehabilitation for the exercise  Encouraged to attend  Pulmonary Rehabilitation for the exercise  Encouraged to attend Pulmonary Rehabilitation for the exercise     Continue Psychosocial Services   No Follow up required  No Follow up required  No Follow up required        Psychosocial Discharge (Final Psychosocial Re-Evaluation): Psychosocial Re-Evaluation - 11/12/17 0910      Psychosocial Re-Evaluation   Current issues with  None Identified    Expected Outcomes  no barriers for participation in program    Interventions  Encouraged to attend Pulmonary Rehabilitation for the exercise    Continue Psychosocial Services   No Follow up required       Education: Education Goals: Education classes will be provided on a weekly basis, covering required topics. Participant will state understanding/return demonstration of topics presented.  Learning Barriers/Preferences: Learning Barriers/Preferences - 09/03/17 1158      Learning Barriers/Preferences   Learning Barriers  None    Learning Preferences  Written Material;Verbal Instruction       Education Topics: Risk Factor Reduction:  -Group instruction that is supported by a PowerPoint presentation. Instructor discusses the definition of a risk factor, different risk factors for pulmonary disease, and how the heart and lungs work together.     Nutrition for Pulmonary Patient:  -Group instruction provided by PowerPoint slides, verbal discussion, and written materials to support subject matter. The instructor gives an explanation and review of healthy diet recommendations, which includes a discussion on weight management, recommendations for fruit and vegetable consumption, as well as protein, fluid, caffeine, fiber, sodium, sugar, and alcohol. Tips for eating when patients are short of breath are discussed.   Pursed Lip Breathing:  -  Group instruction that is supported by demonstration and informational handouts. Instructor discusses the benefits of pursed lip and diaphragmatic breathing and  detailed demonstration on how to preform both.     Oxygen Safety:  -Group instruction provided by PowerPoint, verbal discussion, and written material to support subject matter. There is an overview of "What is Oxygen" and "Why do we need it".  Instructor also reviews how to create a safe environment for oxygen use, the importance of using oxygen as prescribed, and the risks of noncompliance. There is a brief discussion on traveling with oxygen and resources the patient may utilize.   Oxygen Equipment:  -Group instruction provided by [facility] Staff utilizing handouts, written materials, and equipment demonstrations.   PULMONARY REHAB OTHER RESPIRATORY from 11/11/2017 in [place]  Date  10/21/17  Educator  Ace Gins  Instruction Review Code  2- meets goals/outcomes      Signs and Symptoms:  -Group instruction provided by written material and verbal discussion to support subject matter. Warning signs and symptoms of infection, stroke, and heart attack are reviewed and when to call the physician/911 reinforced. Tips for preventing the spread of infection discussed.   Advanced Directives:  -Group instruction provided by verbal instruction and written material to support subject matter. Instructor reviews Advanced Directive laws and proper instruction for filling out document.   Pulmonary Video:  -Group video education that reviews the importance of medication and oxygen compliance, exercise, good nutrition, pulmonary hygiene, and pursed lip and diaphragmatic breathing for the pulmonary patient.   Exercise for the Pulmonary Patient:  -Group instruction that is supported by a PowerPoint presentation. Instructor discusses benefits of exercise, core components of exercise, frequency, duration, and intensity of an exercise routine, importance of utilizing pulse oximetry during exercise, safety while exercising, and options of places to exercise outside of rehab.      Pulmonary Medications:  -Verbally interactive group education provided by instructor with focus on inhaled medications and proper administration.   Anatomy and Physiology of the Respiratory System and Intimacy:  -Group instruction provided by PowerPoint, verbal discussion, and written material to support subject matter. Instructor reviews respiratory cycle and anatomical components of the respiratory system and their functions. Instructor also reviews differences in obstructive and restrictive respiratory diseases with examples of each. Intimacy, Sex, and Sexuality differences are reviewed with a discussion on how relationships can change when diagnosed with pulmonary disease. Common sexual concerns are reviewed.   PULMONARY REHAB OTHER RESPIRATORY from 11/11/2017 in [place]  Date  11/11/17  Educator  rn  Instruction Review Code  2- meets goals/outcomes      MD DAY -A group question and answer session with a medical doctor that allows participants to ask questions that relate to their pulmonary disease state.   OTHER EDUCATION -Group or individual verbal, written, or video instructions that support the educational goals of the pulmonary rehab program.   PULMONARY REHAB OTHER RESPIRATORY from 11/11/2017 in [place]  Date  -- [Holiday Eating]  Educator  Parke Simmers  Instruction Review Code  1- Verbalizes Understanding      Knowledge Questionnaire Score: Knowledge Questionnaire Score - 09/08/17 0944      Knowledge Questionnaire Score   Pre Score  13/13       Core Components/Risk Factors/Patient Goals at Admission: Personal Goals and Risk Factors at Admission - 09/03/17 1159      Core Components/Risk Factors/Patient Goals on Admission  Weight Management  Weight Loss;Yes    Intervention  Weight Management: Develop a combined nutrition and exercise program designed to reach desired caloric intake, while maintaining  appropriate intake of nutrient and fiber, sodium and fats, and appropriate energy expenditure required for the weight goal.;Weight Management: Provide education and appropriate resources to help participant work on and attain dietary goals.;Weight Management/Obesity: Establish reasonable short term and long term weight goals.    Expected Outcomes  Short Term: Continue to assess and modify interventions until short term weight is achieved;Weight Loss: Understanding of general recommendations for a balanced deficit meal plan, which promotes 1-2 lb weight loss per week and includes a negative energy balance of [phone] kcal/d;Understanding recommendations for meals to include 15-35% energy as protein, 25-35% energy from fat, 35-60% energy from carbohydrates, less than 224m of dietary cholesterol, 20-35 gm of total fiber daily;Understanding of distribution of calorie intake throughout the day with the consumption of 4-5 meals/snacks    Improve shortness of breath with ADL's  Yes    Intervention  Provide education, individualized exercise plan and daily activity instruction to help decrease symptoms of SOB with activities of daily living.    Expected Outcomes  Short Term: Achieves a reduction of symptoms when performing activities of daily living.       Core Components/Risk Factors/Patient Goals Review:  Goals and Risk Factor Review    Row Name 10/05/17 0913 10/28/17 0956 11/12/17 0909         Core Components/Risk Factors/Patient Goals Review   Personal Goals Review  Weight Management/Obesity;Improve shortness of breath with ADL's  Weight Management/Obesity;Improve shortness of breath with ADL's  Weight Management/Obesity;Improve shortness of breath with ADL's     Review  has only attended 2 exercise sessions, too early to see progression towards goals  Progressing well!!!! She is much stronger than last time she was in the program, she had a hip replacement and is able to exercise with ease now.  No  weight loss at this time.  Maintaing her weight, conscious of weight and CBG levels, works hard!     Expected Outcomes  should see more results in the next 30 days  Continued progress toward goals  Continued progress toward goals        Core Components/Risk Factors/Patient Goals at Discharge (Final Review):  Goals and Risk Factor Review - 11/12/17 0909      Core Components/Risk Factors/Patient Goals Review   Personal Goals Review  Weight Management/Obesity;Improve shortness of breath with ADL's    Review  Maintaing her weight, conscious of weight and CBG levels, works hard!    Expected Outcomes  Continued progress toward goals       ITP Comments:   Comments: ITP REVIEW Pt is making expected progress toward pulmonary rehab goals after completing 11 sessions. Recommend continued exercise, life style modification, education, and utilization of breathing techniques to increase stamina and strength and decrease shortness of breath with exertion.

## 2017-11-25 ENCOUNTER — Encounter (HOSPITAL_COMMUNITY): Payer: Medicare Other

## 2017-11-30 ENCOUNTER — Encounter (HOSPITAL_COMMUNITY)
Admission: RE | Admit: 2017-11-30 | Discharge: 2017-11-30 | Disposition: A | Discharge status: Home / Self Care | Payer: Medicare Other | Source: Ambulatory Visit | Attending: Internal Medicine | Admitting: Internal Medicine

## 2017-11-30 VITALS — Wt 143.3 lb

## 2017-11-30 DIAGNOSIS — J849 Interstitial pulmonary disease, unspecified: Secondary | ICD-10-CM | POA: Diagnosis not present

## 2017-11-30 NOTE — Progress Notes (Signed)
Daily Session Note  Patient Details  Name: Rhonda [name] MRN: [id number] Date of Birth: 05/21/1951 Referring Provider:     Pulmonary Rehab Walk Test from 09/09/2017 in [place]  Referring Provider  Dr. Chase Caller      Encounter Date: 11/30/2017  Check In: Session Check In - 11/30/17 1501      Check-In   Location  [facility]    Staff Present  Rosebud Poles, RN, BSN;Molly diVincenzo, MS, ACSM RCEP, Exercise Physiologist;Portia Rollene Rotunda, RN, BSN    Supervising physician immediately available to respond to emergencies  [facility] immediately available    Physician(s)  Dr. Eliseo Squires    Medication changes reported      No    Fall or balance concerns reported     No    Tobacco Cessation  No Change    Warm-up and Cool-down  Performed as group-led instruction    Resistance Training Performed  Yes    VAD Patient?  No      Pain Assessment   Currently in Pain?  No/denies    Multiple Pain Sites  No       Capillary Blood Glucose: No results found for this or any previous visit (from the past 24 hour(s)).  Exercise Prescription Changes - 11/30/17 1600      Response to Exercise   Blood Pressure (Admit)  108/56    Blood Pressure (Exercise)  100/60    Blood Pressure (Exit)  106/52    Heart Rate (Admit)  75 bpm    Heart Rate (Exercise)  97 bpm    Heart Rate (Exit)  79 bpm    Oxygen Saturation (Admit)  95 %    Oxygen Saturation (Exercise)  91 %    Oxygen Saturation (Exit)  94 %    Rating of Perceived Exertion (Exercise)  15    Perceived Dyspnea (Exercise)  2    Duration  Progress to 45 minutes of aerobic exercise without signs/symptoms of physical distress    Intensity  THRR unchanged      Progression   Progression  Continue to progress workloads to maintain intensity without signs/symptoms of physical distress.      Resistance Training   Training Prescription  Yes    Weight  orange bands    Reps  10-15    Time  10 Minutes      Bike   Level  3.5    Minutes  17      Rower   Level  2    Minutes  17      Track   Laps  15    Minutes  17       Social History   Tobacco Use  Smoking Status Never Smoker  Smokeless Tobacco Never Used  Tobacco Comment   pt states she experimented in college    Goals Met:  Exercise tolerated well Strength training completed today  Goals Unmet:  Not Applicable  Comments: Service time is from 1330 to [place]    Dr. Rush Farmer is Medical Director for Pulmonary Rehab at [facility].

## 2017-12-02 ENCOUNTER — Encounter (HOSPITAL_COMMUNITY)
Admission: RE | Admit: 2017-12-02 | Discharge: 2017-12-02 | Disposition: A | Discharge status: Home / Self Care | Payer: Medicare Other | Source: Ambulatory Visit | Attending: Internal Medicine | Admitting: Internal Medicine

## 2017-12-02 VITALS — Wt 141.5 lb

## 2017-12-02 DIAGNOSIS — J849 Interstitial pulmonary disease, unspecified: Secondary | ICD-10-CM

## 2017-12-02 DIAGNOSIS — H35372 Puckering of macula, left eye: Secondary | ICD-10-CM | POA: Diagnosis not present

## 2017-12-02 DIAGNOSIS — E119 Type 2 diabetes mellitus without complications: Secondary | ICD-10-CM | POA: Diagnosis not present

## 2017-12-02 NOTE — Progress Notes (Signed)
Daily Session Note  Patient Details  Name: Rhonda [name] MRN: [id number] Date of Birth: 12/26/1950 Referring Provider:     Pulmonary Rehab Walk Test from 09/09/2017 in [facility]  Referring Provider  Dr. Ramaswamy      Encounter Date: 12/02/2017  Check In: Session Check In - 12/02/17 1330      Check-In   Location  [facility]    Staff Present  Joan Behrens, RN, BSN;Molly diVincenzo, MS, ACSM RCEP, Exercise Physiologist;Portia Payne, RN, BSN;[name] Hughes, RN    Supervising physician immediately available to respond to emergencies  [facility] immediately available    Physician(s)  Dr. Gherghe    Medication changes reported      No    Fall or balance concerns reported     No    Tobacco Cessation  No Change    Warm-up and Cool-down  Performed as group-led instruction    Resistance Training Performed  Yes    VAD Patient?  No      Pain Assessment   Currently in Pain?  No/denies    Multiple Pain Sites  No       Capillary Blood Glucose: No results found for this or any previous visit (from the past 24 hour(s)).  Exercise Prescription Changes - 12/02/17 1551      Response to Exercise   Blood Pressure (Admit)  110/52    Blood Pressure (Exercise)  124/60    Blood Pressure (Exit)  100/60    Heart Rate (Admit)  72 bpm    Heart Rate (Exercise)  95 bpm    Heart Rate (Exit)  75 bpm    Oxygen Saturation (Admit)  94 %    Oxygen Saturation (Exercise)  91 %    Oxygen Saturation (Exit)  95 %    Rating of Perceived Exertion (Exercise)  15    Perceived Dyspnea (Exercise)  3    Duration  Progress to 45 minutes of aerobic exercise without signs/symptoms of physical distress    Intensity  THRR unchanged      Progression   Progression  Continue to progress workloads to maintain intensity without signs/symptoms of physical distress.      Resistance Training   Training Prescription  Yes    Weight  orange bands    Reps  10-15     Time  10 Minutes      Rower   Level  3    Minutes  17      Track   Laps  17    Minutes  17       Social History   Tobacco Use  Smoking Status Never Smoker  Smokeless Tobacco Never Used  Tobacco Comment   pt states she experimented in college    Goals Met:  Independence with exercise equipment Improved SOB with ADL's Using PLB without cueing & demonstrates good technique Exercise tolerated well No report of cardiac concerns or symptoms Strength training completed today  Goals Unmet:  Not Applicable  Comments: Service time is from 1330 to 1520   Dr. Wesam G. Yacoub is Medical Director for Pulmonary Rehab at [facility]. 

## 2017-12-07 ENCOUNTER — Encounter (HOSPITAL_COMMUNITY)
Admission: RE | Admit: 2017-12-07 | Discharge: 2017-12-07 | Disposition: A | Discharge status: Home / Self Care | Payer: Medicare Other | Source: Ambulatory Visit | Attending: Internal Medicine | Admitting: Internal Medicine

## 2017-12-07 VITALS — Wt 142.0 lb

## 2017-12-07 DIAGNOSIS — J849 Interstitial pulmonary disease, unspecified: Secondary | ICD-10-CM | POA: Diagnosis not present

## 2017-12-07 NOTE — Progress Notes (Signed)
Daily Session Note  Patient Details  Name: Rhonda [name] MRN: [id number] Date of Birth: [birth year] Referring Provider:     Pulmonary Rehab Walk Test from 09/09/2017 in [place]  Referring Provider  Dr. Chase Caller      Encounter Date: 12/07/2017  Check In: Session Check In - 12/07/17 1330      Check-In   Location  [facility]    Staff Present  Rosebud Poles, RN, Luisa Hart, RN, BSN;Denym Rahimi, MS, ACSM RCEP, Exercise Physiologist;[name] Ysidro Evert, RN    Supervising physician immediately available to respond to emergencies  [facility] immediately available    Physician(s)  Dr. Eliseo Squires    Medication changes reported      No    Fall or balance concerns reported     No    Tobacco Cessation  No Change    Warm-up and Cool-down  Performed as group-led instruction    Resistance Training Performed  Yes    VAD Patient?  No      Pain Assessment   Currently in Pain?  No/denies    Multiple Pain Sites  No       Capillary Blood Glucose: No results found for this or any previous visit (from the past 24 hour(s)). POCT Glucose - 12/07/17 1510      POCT Blood Glucose   Pre-Exercise  169 mg/dL    Post-Exercise  94 mg/dL      Exercise Prescription Changes - 12/07/17 1500      Response to Exercise   Blood Pressure (Admit)  110/60    Blood Pressure (Exercise)  122/70    Blood Pressure (Exit)  100/60    Heart Rate (Admit)  78 bpm    Heart Rate (Exercise)  96 bpm    Heart Rate (Exit)  80 bpm    Oxygen Saturation (Admit)  98 %    Oxygen Saturation (Exercise)  92 %    Oxygen Saturation (Exit)  94 %    Rating of Perceived Exertion (Exercise)  16    Perceived Dyspnea (Exercise)  2    Duration  Progress to 45 minutes of aerobic exercise without signs/symptoms of physical distress    Intensity  THRR unchanged      Progression   Progression  Continue to progress workloads to maintain intensity without signs/symptoms of physical  distress.      Resistance Training   Training Prescription  Yes    Weight  orange bands    Reps  10-15    Time  10 Minutes      Bike   Level  3.5    Minutes  17      Rower   Level  3    Minutes  17      Track   Laps  12    Minutes  17       Social History   Tobacco Use  Smoking Status Never Smoker  Smokeless Tobacco Never Used  Tobacco Comment   pt states she experimented in college    Goals Met:  Exercise tolerated well Queuing for purse lip breathing No report of cardiac concerns or symptoms  Goals Unmet:  Not Applicable  Comments: Service time is from 1:30p to 2:45p    Dr. Rush Farmer is Medical Director for Pulmonary Rehab at [facility].

## 2017-12-09 ENCOUNTER — Encounter (HOSPITAL_COMMUNITY)
Admission: RE | Admit: 2017-12-09 | Discharge: 2017-12-09 | Disposition: A | Discharge status: Home / Self Care | Payer: Medicare Other | Source: Ambulatory Visit | Attending: Internal Medicine | Admitting: Internal Medicine

## 2017-12-09 VITALS — Wt 142.0 lb

## 2017-12-09 DIAGNOSIS — J849 Interstitial pulmonary disease, unspecified: Secondary | ICD-10-CM

## 2017-12-09 NOTE — Progress Notes (Signed)
Daily Session Note  Patient Details  Name: Rhonda [name] MRN: [id number] Date of Birth: October 14, 1951 Referring Provider:     Pulmonary Rehab Walk Test from 09/09/2017 in [place]  Referring Provider  Dr. Chase Caller      Encounter Date: 12/09/2017  Check In: Session Check In - 12/09/17 1330      Check-In   Location  [facility]    Staff Present  Rosebud Poles, RN, Luisa Hart, RN, BSN;Molly diVincenzo, MS, ACSM RCEP, Exercise Physiologist;[name] Ysidro Evert, RN    Supervising physician immediately available to respond to emergencies  [facility] immediately available    Physician(s)  Dr. Eliseo Squires    Medication changes reported      No    Fall or balance concerns reported     No    Tobacco Cessation  No Change    Warm-up and Cool-down  Performed as group-led instruction    Resistance Training Performed  Yes    VAD Patient?  No      Pain Assessment   Currently in Pain?  No/denies    Multiple Pain Sites  No       Capillary Blood Glucose: No results found for this or any previous visit (from the past 24 hour(s)).  Exercise Prescription Changes - 12/09/17 1500      Response to Exercise   Blood Pressure (Admit)  130/52    Blood Pressure (Exercise)  134/56    Blood Pressure (Exit)  112/60    Heart Rate (Admit)  70 bpm    Heart Rate (Exercise)  98 bpm    Heart Rate (Exit)  78 bpm    Oxygen Saturation (Admit)  94 %    Oxygen Saturation (Exercise)  92 %    Oxygen Saturation (Exit)  94 %    Rating of Perceived Exertion (Exercise)  16    Perceived Dyspnea (Exercise)  2    Duration  Progress to 45 minutes of aerobic exercise without signs/symptoms of physical distress    Intensity  THRR unchanged      Progression   Progression  Continue to progress workloads to maintain intensity without signs/symptoms of physical distress.      Resistance Training   Training Prescription  Yes    Weight  orange bands    Reps  10-15    Time  10 Minutes      Bike   Level  3.5    Minutes  17      Track   Laps  18    Minutes  17       Social History   Tobacco Use  Smoking Status Never Smoker  Smokeless Tobacco Never Used  Tobacco Comment   pt states she experimented in college    Goals Met:  Exercise tolerated well Strength training completed today  Goals Unmet:  Not Applicable  Comments: Service time is from 1330 to [place]    Dr. Rush Farmer is Medical Director for Pulmonary Rehab at [facility].

## 2017-12-12 ENCOUNTER — Telehealth: Payer: Self-pay | Admitting: Internal Medicine

## 2017-12-12 NOTE — Telephone Encounter (Signed)
Awanda Mink PFT visit for patient coming up this week but keep the OV with me.  Dr. Brand Males, M.D., [facility].C.P Pulmonary and Critical Care Medicine Staff Physician, [place] Director - Interstitial Lung Disease  Program  Pulmonary [place] at [place], [place], [id number]  Pager: [phone], If no answer or between  15:00h - 7:00h: call [phone] Telephone: [phone]

## 2017-12-13 NOTE — Telephone Encounter (Signed)
PFT appt cancelled.    Called pt letting her know this has been done and we still needed her to come for her OV with MR.  Pt expressed understanding. Nothing further needed at this current time.

## 2017-12-14 ENCOUNTER — Other Ambulatory Visit: Payer: Self-pay | Admitting: Family Medicine

## 2017-12-14 ENCOUNTER — Encounter (HOSPITAL_COMMUNITY)
Admission: RE | Admit: 2017-12-14 | Discharge: 2017-12-14 | Disposition: A | Discharge status: Home / Self Care | Payer: Medicare Other | Source: Ambulatory Visit | Attending: Internal Medicine | Admitting: Internal Medicine

## 2017-12-14 VITALS — Wt 142.0 lb

## 2017-12-14 DIAGNOSIS — J849 Interstitial pulmonary disease, unspecified: Secondary | ICD-10-CM | POA: Diagnosis not present

## 2017-12-14 DIAGNOSIS — Z1231 Encounter for screening mammogram for malignant neoplasm of breast: Secondary | ICD-10-CM

## 2017-12-14 NOTE — Progress Notes (Signed)
Pulmonary Individual Treatment Plan  Patient Details  Name: Rhonda [name] MRN: [id number] Date of Birth: 04/30/1951 Referring Provider:     Pulmonary Rehab Walk Test from 09/09/2017 in [place]  Referring Provider  Dr. Chase Caller      Initial Encounter Date:    Pulmonary Rehab Walk Test from 09/09/2017 in [place] [facility]  Date  09/09/17  Referring Provider  Dr. Chase Caller      Visit Diagnosis: ILD (interstitial lung disease) ([place])  Patient's Home Medications on Admission:   Current Outpatient Medications:  .  acetaminophen (TYLENOL) 325 MG tablet, Take 650 mg by mouth every 6 (six) hours as needed. Reported on 02/10/2016, Disp: , Rfl:  .  albuterol (PROAIR HFA) 108 (90 Base) MCG/ACT inhaler, Inhale 2 puffs into the lungs every 4 (four) hours as needed for wheezing. Or coughing spells.  You may use 2 Puffs 5-10 minutes before exercise., Disp: 1 Inhaler, Rfl: 3 .  Alpha-D-Galactosidase (BEANO PO), Take 1 tablet by mouth as needed (if eating onion or garlic). , Disp: , Rfl:  .  AMBULATORY NON FORMULARY MEDICATION, Cinnsulin cinnamon caplsule 1 by mouth daily, Disp: , Rfl:  .  Ascorbic Acid (VITAMIN C) 1000 MG tablet, Take 1,000 mg by mouth daily., Disp: , Rfl:  .  BREO ELLIPTA 100-25 MCG/INH AEPB, TAKE 1 PUFF BY MOUTH EVERY DAY, Disp: 180 each, Rfl: 1 .  Calcium Citrate-Vitamin D (CALCITRATE PLUS D PO), Take 1 tablet by mouth daily., Disp: , Rfl:  .  Calcium-Vitamin D-Vitamin K (VIACTIV) 268-341-96 MG-UNT-MCG CHEW, Chew 1 tablet by mouth daily., Disp: , Rfl:  .  chlorpheniramine (CHLOR-TRIMETON) 4 MG tablet, Take 4 mg by mouth daily as needed for allergies. , Disp: , Rfl:  .  dicyclomine (BENTYL) 10 MG capsule, Take 10 mg by mouth daily. Reported on 01/06/2016, Disp: , Rfl:  .  ezetimibe (ZETIA) 10 MG tablet, Take 1 tablet (10 mg total) by mouth daily., Disp: 30 tablet, Rfl: 11 .  fluticasone (FLONASE) 50 MCG/ACT nasal spray,  SPRAY 2 SPRAYS INTO EACH NOSTRIL EVERY DAY, Disp: 1 g, Rfl: 12 .  metFORMIN (GLUCOPHAGE-XR) 500 MG 24 hr tablet, Take 500 mg by mouth daily., Disp: , Rfl:  .  metoprolol succinate (TOPROL-XL) 50 MG 24 hr tablet, TAKE 1 TABLET BY MOUTH EVERY DAY IMMEDIATELY FOLLOWING A MEAL. (Patient taking differently: Take 25 mg by mouth daily. TAKE 1 TABLET BY MOUTH EVERY DAY IMMEDIATELY FOLLOWING A MEAL.), Disp: 30 tablet, Rfl: 0 .  montelukast (SINGULAIR) 10 MG tablet, Take 1 tablet (10 mg total) by mouth daily., Disp: 30 tablet, Rfl: 0 .  Multiple Vitamin (MULTIVITAMIN) tablet, Take 1 tablet by mouth daily., Disp: , Rfl:  .  Naproxen Sodium (ALEVE) 220 MG CAPS, Take 1-2 capsules by mouth as needed., Disp: , Rfl:  .  Omega-3 Fatty Acids (FISH OIL) 1000 MG CAPS, Take 1,000 mg by mouth daily., Disp: , Rfl:  .  omeprazole (PRILOSEC) 20 MG capsule, Take 1 capsule (20 mg total) by mouth daily. (Patient taking differently: Take 20 mg by mouth daily. Patient takes 10 mg), Disp: 30 capsule, Rfl: 5 .  pravastatin (PRAVACHOL) 40 MG tablet, Take 40 mg by mouth daily., Disp: , Rfl:  .  predniSONE (DELTASONE) 10 MG tablet, Take 1 tablet (10 mg total) daily with breakfast by mouth., Disp: 60 tablet, Rfl: 2 .  predniSONE (DELTASONE) 5 MG tablet, Take 5 mg by mouth daily with breakfast., Disp: ,  Rfl:  .  Probiotic Product (PROBIOTIC ADVANCED PO), Take 1 capsule by mouth daily. , Disp: , Rfl:  .  Turmeric 500 MG CAPS, Take 1 capsule by mouth daily., Disp: , Rfl:   Past Medical History: Past Medical History:  Diagnosis Date  . Allergy   . Arthritis    history spinal stenosis. osteoarthritis right hip  . Asthma   . Cataract   . Coronary artery calcification seen on CAT scan 08/19/2017   >300 on CT scan 08/2017  . Depression   . DOE (dyspnea on exertion)    a. 04/2010 Lexi MV EF 71%, no ischemia/infarct;    . GERD (gastroesophageal reflux disease)   . H/O steroid therapy    Steroid use orally over 4 yrs- for Lung  Fibrosis  . Heart palpitations 02/28/2015  . Helicobacter pylori ab+   . Hiatal hernia   . High cholesterol   . History of chronic bronchitis    as child  . Hyperlipidemia, mixed 08/19/2017  . Hyperplastic colon polyp 2007  . Inguinal hernia    right  . Insulin resistance    past  . Interstitial lung disease ([place])   . MVP (mitral valve prolapse)    Posterior mitral valve leaflet with mild MR  . Pneumonitis, hypersensitivity ([place])    a. 09/2012 s/p Bx - ? 2/2 bird, mold, oil paint exposure ->on steroids, followed by pulm.  . Pulmonary fibrosis ([facility])    Dr. Chase Caller follows- stable at present  . Pulmonary HTN ([place])    mild PASP 43mHg on echo 02/2015  . PVC (premature ventricular contraction) 08/19/2017  . Rapid heart rate    Dr. TRadford Paxfollows- last visit Epic note 9'16    Tobacco Use: Social History   Tobacco Use  Smoking Status Never Smoker  Smokeless Tobacco Never Used  Tobacco Comment   pt states she experimented in college    Labs: Recent Review Flowsheet Data    Labs for ITP Cardiac and Pulmonary Rehab Latest Ref Rng & Units 10/12/2014 01/22/2015 02/26/2015 08/21/2015 11/08/2017   Cholestrol 100 - 199 mg/dL 176 - 184 - 180   LDLCALC 0 - 99 mg/dL 79 - 94 - 77   HDL >39 mg/dL 50 - 53 - 71   Trlycerides 0 - 149 mg/dL 236(H) - 185(H) - 158(H)   Hemoglobin A1c <5.7 % - 5.9 - 6.1(H) -   PHART 7.350 - 7.450 - - - - -   PCO2ART 35.0 - 45.0 mmHg - - - - -   HCO3 20.0 - 24.0 mEq/L - - - - -   TCO2 0 - 100 mmol/L - - - - -   ACIDBASEDEF 0.0 - 2.0 mmol/L - - - - -   O2SAT % - - - - -      Capillary Blood Glucose: Lab Results  Component Value Date   GLUCAP 110 (H) 10/01/2012   POCT Glucose    Row Name 09/28/17 1523 09/30/17 1549 10/12/17 1522 10/26/17 1502 10/28/17 1609     POCT Blood Glucose   Pre-Exercise  157 mg/dL  152 mg/dL  125 mg/dL  143 mg/dL  208 mg/dL   Post-Exercise  136 mg/dL  109 mg/dL  98 mg/dL  95 mg/dL  108 mg/dL   Row Name 11/04/17 1531 12/02/17 1555  12/07/17 1510         POCT Blood Glucose   Pre-Exercise  169 mg/dL  200 mg/dL  169 mg/dL     Post-Exercise  126 mg/dL  130 mg/dL  94 mg/dL        Pulmonary Assessment Scores: Pulmonary Assessment Scores    Row Name 09/08/17 0944 09/09/17 1632       ADL UCSD   ADL Phase  Entry  -    SOB Score total  21  -      CAT Score   CAT Score  13 Entry  -      mMRC Score   mMRC Score  -  1       Pulmonary Function Assessment: Pulmonary Function Assessment - 09/03/17 1158      Breath   Bilateral Breath Sounds  Clear    Shortness of Breath  Yes;Limiting activity extreme exertion or high elevations       Exercise Target Goals:    Exercise Program Goal: Individual exercise prescription set using results from initial 6 min walk test and THRR while considering  patient's activity barriers and safety.    Exercise Prescription Goal: Initial exercise prescription builds to 30-45 minutes a day of aerobic activity, 2-3 days per week.  Home exercise guidelines will be given to patient during program as part of exercise prescription that the participant will acknowledge.  Activity Barriers & Risk Stratification: Activity Barriers & Cardiac Risk Stratification - 09/03/17 1138      Activity Barriers & Cardiac Risk Stratification   Activity Barriers  Right Hip Replacement;Shortness of Breath       6 Minute Walk: 6 Minute Walk    Row Name 09/09/17 1616         6 Minute Walk   Phase  Initial     Distance  1200 feet     Walk Time  6 minutes     # of Rest Breaks  0     MPH  2.27     METS  3.07     RPE  10     Perceived Dyspnea   1     Symptoms  No     Resting HR  87 bpm     Resting BP  130/78     Resting Oxygen Saturation   95 %     Exercise Oxygen Saturation  during 6 min walk  88 %     Max Ex. HR  109 bpm     Max Ex. BP  148/79       Interval HR   1 Minute HR  87     2 Minute HR  100     3 Minute HR  105     4 Minute HR  109     5 Minute HR  105     6 Minute HR   105     2 Minute Post HR  88     Interval Heart Rate?  Yes       Interval Oxygen   Interval Oxygen?  Yes     Baseline Oxygen Saturation %  95 %     1 Minute Oxygen Saturation %  93 %     1 Minute Liters of Oxygen  0 L     2 Minute Oxygen Saturation %  90 %     2 Minute Liters of Oxygen  0 L     3 Minute Oxygen Saturation %  89 %     3 Minute Liters of Oxygen  0 L     4 Minute Oxygen Saturation %  88 %     4  Minute Liters of Oxygen  0 L     5 Minute Oxygen Saturation %  89 %     5 Minute Liters of Oxygen  0 L     6 Minute Oxygen Saturation %  90 %     6 Minute Liters of Oxygen  0 L     2 Minute Post Oxygen Saturation %  92 %     2 Minute Post Liters of Oxygen  0 L        Oxygen Initial Assessment: Oxygen Initial Assessment - 12/13/17 1015      Home Oxygen   Home Oxygen Device  Portable Concentrator;Home Concentrator    Home Exercise Oxygen Prescription  None    Home at Rest Exercise Oxygen Prescription  None    Compliance with Home Oxygen Use  Yes      Initial 6 min Walk   Oxygen Used  None      Program Oxygen Prescription   Program Oxygen Prescription  None       Oxygen Re-Evaluation: Oxygen Re-Evaluation    Row Name 10/05/17 0907 10/28/17 0954 11/12/17 0907 12/13/17 1016       Program Oxygen Prescription   Program Oxygen Prescription  None  None  None  -      Home Oxygen   Home Oxygen Device  Portable Concentrator;Home Concentrator  Portable Concentrator;Home Concentrator  Portable Concentrator;Home Concentrator does not need oxygen, but has the equipment from past use  -    Sleep Oxygen Prescription  None  None  None  -    Home Exercise Oxygen Prescription  None has not required oxygen while exercising in pulmonary rehab  None  None  -    Liters per minute  -  0  0  -    Home at Rest Exercise Oxygen Prescription  None  None  None  -    Compliance with Home Oxygen Use  Yes  -  -  -      Goals/Expected Outcomes   Short Term Goals  To learn and exhibit  compliance with exercise, home and travel O2 prescription  -  -  To learn and exhibit compliance with exercise, home and travel O2 prescription    Long  Term Goals  Exhibits compliance with exercise, home and travel O2 prescription;Verbalizes importance of monitoring SPO2 with pulse oximeter and return demonstration;Exhibits proper breathing techniques, such as pursed lip breathing or other method taught during program session;Maintenance of O2 saturations>88%;Compliance with respiratory medication;Demonstrates proper use of MDI's  -  -  Exhibits compliance with exercise, home and travel O2 prescription;Verbalizes importance of monitoring SPO2 with pulse oximeter and return demonstration;Exhibits proper breathing techniques, such as pursed lip breathing or other method taught during program session;Maintenance of O2 saturations>88%;Compliance with respiratory medication;Demonstrates proper use of MDI's    Comments  -  Is not using oxygen now, but did require it n the past  -  Is not using oxygen now, but did require it n the past    Goals/Expected Outcomes  -  -  -  n/a       Oxygen Discharge (Final Oxygen Re-Evaluation): Oxygen Re-Evaluation - 12/13/17 1016      Goals/Expected Outcomes   Short Term Goals  To learn and exhibit compliance with exercise, home and travel O2 prescription    Long  Term Goals  Exhibits compliance with exercise, home and travel O2 prescription;Verbalizes importance of monitoring SPO2 with pulse oximeter and return demonstration;Exhibits proper  breathing techniques, such as pursed lip breathing or other method taught during program session;Maintenance of O2 saturations>88%;Compliance with respiratory medication;Demonstrates proper use of MDI's    Comments  Is not using oxygen now, but did require it n the past    Goals/Expected Outcomes  n/a       Initial Exercise Prescription: Initial Exercise Prescription - 09/09/17 1600      Date of Initial Exercise RX and  Referring Provider   Date  09/09/17    Referring Provider  Dr. Hilarie Fredrickson   Level  0.4    Minutes  17      Rower   Level  1    Minutes  17      Track   Laps  10    Minutes  17      Prescription Details   Frequency (times per week)  2    Duration  Progress to 45 minutes of aerobic exercise without signs/symptoms of physical distress      Intensity   THRR 40-80% of Max Heartrate  61-122    Ratings of Perceived Exertion  11-13    Perceived Dyspnea  0-4      Progression   Progression  Continue progressive overload as per policy without signs/symptoms or physical distress.      Resistance Training   Training Prescription  Yes    Weight  orange bands    Reps  10-15       Perform Capillary Blood Glucose checks as needed.  Exercise Prescription Changes: Exercise Prescription Changes    Row Name 09/28/17 1500 09/30/17 1548 10/07/17 1500 10/12/17 1500 10/19/17 1541     Response to Exercise   Blood Pressure (Admit)  104/48  102/50  100/54  108/56  98/56   Blood Pressure (Exercise)  146/54  138/74  142/60  130/64  110/46   Blood Pressure (Exit)  90/46  118/64  114/60  120/64  110/54   Heart Rate (Admit)  91 bpm  77 bpm  72 bpm  72 bpm  80 bpm   Heart Rate (Exercise)  105 bpm  92 bpm  105 bpm  99 bpm  96 bpm   Heart Rate (Exit)  89 bpm  84 bpm  82 bpm  70 bpm  85 bpm   Oxygen Saturation (Admit)  92 %  93 %  95 %  94 %  95 %   Oxygen Saturation (Exercise)  90 %  92 %  89 %  92 %  92 %   Oxygen Saturation (Exit)  95 %  92 %  95 %  95 %  92 %   Rating of Perceived Exertion (Exercise)  _0 Perceived Dyspnea (Exercise)  2  1  0  3  2   Duration  Progress to 45 minutes of aerobic exercise without signs/symptoms of physical distress  Progress to 45 minutes of aerobic exercise without signs/symptoms of physical distress  Progress to 45 minutes of aerobic exercise without signs/symptoms of physical distress  Progress to 45 minutes of aerobic exercise without  signs/symptoms of physical distress  Progress to 45 minutes of aerobic exercise without signs/symptoms of physical distress   Intensity  Other (comment) 40-80% of HRR  Other (comment) 40-80% of HRR  THRR unchanged  THRR unchanged  THRR unchanged     Progression   Progression  Continue to progress workloads to maintain intensity without  signs/symptoms of physical distress.  Continue to progress workloads to maintain intensity without signs/symptoms of physical distress.  Continue to progress workloads to maintain intensity without signs/symptoms of physical distress.  Continue to progress workloads to maintain intensity without signs/symptoms of physical distress.  Continue to progress workloads to maintain intensity without signs/symptoms of physical distress.     Resistance Training   Training Prescription  Yes  Yes  Yes  Yes  Yes   Weight  orange bands  orange bands  orange bands  orange bands  orange bands   Reps  10-15  10-15  10-15  10-15  10-15   Time  10 Minutes  10 Minutes  10 Minutes  10 Minutes  10 Minutes     Bike   Level  3  -  3  3.5  3.5   Minutes  17  -  _0 Rower   Level  _1 Minutes  _2 Track   Laps  _3 Minutes  _4 Home Exercise Plan   Plans to continue exercise at  -  -  [employer] [employer] (comment)  -  -   Frequency  -  -  Add 3 additional days to program exercise sessions.  -  -   Row Name 10/21/17 1600 10/26/17 1500 10/28/17 1608 11/04/17 1530 11/09/17 1500     Response to Exercise   Blood Pressure (Admit)  106/56  92/50  108/50  128/60  100/54   Blood Pressure (Exercise)  120/62  140/70  146/66  120/50  -   Blood Pressure (Exit)  102/60  100/56  102/70  90/60  102/54   Heart Rate (Admit)  71 bpm  76 bpm  75 bpm  74 bpm  72 bpm   Heart Rate (Exercise)  92 bpm  93 bpm  97 bpm  86 bpm  93 bpm   Heart Rate (Exit)  75 bpm  69 bpm  82 bpm  76 bpm  80 bpm   Oxygen Saturation (Admit)  95  %  94 %  93 %  97 %  93 %   Oxygen Saturation (Exercise)  91 %  91 %  92 %  91 %  91 %   Oxygen Saturation (Exit)  95 %  96 %  94 %  95 %  94 %   Rating of Perceived Exertion (Exercise)  _5 Perceived Dyspnea (Exercise)  _6 Duration  Progress to 45 minutes of aerobic exercise without signs/symptoms of physical distress  Progress to 45 minutes of aerobic exercise without signs/symptoms of physical distress  Progress to 45 minutes of aerobic exercise without signs/symptoms of physical distress  Progress to 45 minutes of aerobic exercise without signs/symptoms of physical distress  Progress to 45 minutes of aerobic exercise without signs/symptoms of physical distress   Intensity  THRR unchanged  THRR unchanged  THRR unchanged  THRR unchanged  THRR unchanged     Progression   Progression  Continue to progress workloads to maintain intensity without signs/symptoms of physical distress.  Continue to progress workloads to maintain intensity without signs/symptoms  of physical distress.  Continue to progress workloads to maintain intensity without signs/symptoms of physical distress.  Continue to progress workloads to maintain intensity without signs/symptoms of physical distress.  Continue to progress workloads to maintain intensity without signs/symptoms of physical distress.     Resistance Training   Training Prescription  Yes  Yes  Yes  Yes  Yes   Weight  orange bands  orange bands  orange bands  orange bands  orange bands   Reps  10-15  10-15  10-15  10-15  10-15   Time  10 Minutes  10 Minutes  10 Minutes  10 Minutes  10 Minutes     Bike   Level  3.5  3.5  3.5  3.5  3.5   Minutes  _0 Rower   Level  -  2  -  2  2   Minutes  -  17  -  17  17     Track   Laps  _1 Minutes  _2 Row Name 11/11/17 1614 11/30/17 1600 12/02/17 1551 12/07/17 1500 12/09/17 1500     Response to Exercise   Blood Pressure (Admit)   100/54  108/56  110/52  110/60  130/52   Blood Pressure (Exercise)  130/52  100/60  124/60  122/70  134/56   Blood Pressure (Exit)  110/64  106/52  100/60  100/60  112/60   Heart Rate (Admit)  74 bpm  75 bpm  72 bpm  78 bpm  70 bpm   Heart Rate (Exercise)  93 bpm  97 bpm  95 bpm  96 bpm  98 bpm   Heart Rate (Exit)  78 bpm  79 bpm  75 bpm  80 bpm  78 bpm   Oxygen Saturation (Admit)  94 %  95 %  94 %  98 %  94 %   Oxygen Saturation (Exercise)  88 %  91 %  91 %  92 %  92 %   Oxygen Saturation (Exit)  96 %  94 %  95 %  94 %  94 %   Rating of Perceived Exertion (Exercise)  _3 Perceived Dyspnea (Exercise)  _4 Duration  Progress to 45 minutes of aerobic exercise without signs/symptoms of physical distress  Progress to 45 minutes of aerobic exercise without signs/symptoms of physical distress  Progress to 45 minutes of aerobic exercise without signs/symptoms of physical distress  Progress to 45 minutes of aerobic exercise without signs/symptoms of physical distress  Progress to 45 minutes of aerobic exercise without signs/symptoms of physical distress   Intensity  THRR unchanged  THRR unchanged  THRR unchanged  THRR unchanged  THRR unchanged     Progression   Progression  Continue to progress workloads to maintain intensity without signs/symptoms of physical distress.  Continue to progress workloads to maintain intensity without signs/symptoms of physical distress.  Continue to progress workloads to maintain intensity without signs/symptoms of physical distress.  Continue to progress workloads to maintain intensity without signs/symptoms of physical distress.  Continue to progress workloads to maintain intensity without signs/symptoms of physical distress.     Resistance Training   Training Prescription  Yes  Yes  Yes  Yes  Yes   Weight  orange bands  orange bands  orange bands  orange bands  orange bands   Reps  10-15  10-15  10-15  10-15  10-15   Time  10 Minutes  10  Minutes  10 Minutes  10 Minutes  10 Minutes     Bike   Level  3.5  3.5  -  3.5  3.5   Minutes  17  17  -  17  17     Rower   Level  _0 -   Minutes  _1 -     Track   Laps  -  _2 Minutes  -  _3 Exercise Comments: Exercise Comments    Row Name 10/08/17 0853           Exercise Comments  Home exercise completed          Exercise Goals and Review: Exercise Goals    Row Name 09/03/17 1143             Exercise Goals   Increase Physical Activity  Yes       Intervention  Provide advice, education, support and counseling about physical activity/exercise needs.;Develop an individualized exercise prescription for aerobic and resistive training based on initial evaluation findings, risk stratification, comorbidities and participant's personal goals.       Expected Outcomes  Achievement of increased cardiorespiratory fitness and enhanced flexibility, muscular endurance and strength shown through measurements of functional capacity and personal statement of participant.       Increase Strength and Stamina  Yes       Intervention  Provide advice, education, support and counseling about physical activity/exercise needs.;Develop an individualized exercise prescription for aerobic and resistive training based on initial evaluation findings, risk stratification, comorbidities and participant's personal goals.       Expected Outcomes  Achievement of increased cardiorespiratory fitness and enhanced flexibility, muscular endurance and strength shown through measurements of functional capacity and personal statement of participant.       Able to understand and use rate of perceived exertion (RPE) scale  Yes       Intervention  Provide education and explanation on how to use RPE scale       Expected Outcomes  Short Term: Able to use RPE daily in rehab to express subjective intensity level;Long Term:  Able to use RPE to guide intensity level when  exercising independently       Able to understand and use Dyspnea scale  Yes       Intervention  Provide education and explanation on how to use Dyspnea scale       Expected Outcomes  Short Term: Able to use Dyspnea scale daily in rehab to express subjective sense of shortness of breath during exertion;Long Term: Able to use Dyspnea scale to guide intensity level when exercising independently       Knowledge and understanding of Target Heart Rate Range (THRR)  Yes       Intervention  Provide education and explanation of THRR including how the numbers were predicted and where they are located for reference       Expected Outcomes  Short Term: Able to state/look up THRR;Long Term: Able to use THRR to govern intensity when exercising independently;Short Term: Able to use daily as guideline  for intensity in rehab       Understanding of Exercise Prescription  Yes       Intervention  Provide education, explanation, and written materials on patient's individual exercise prescription       Expected Outcomes  Short Term: Able to explain program exercise prescription;Long Term: Able to explain home exercise prescription to exercise independently          Exercise Goals Re-Evaluation : Exercise Goals Re-Evaluation    Row Name 10/04/17 1649 10/07/17 1646 10/25/17 1554 11/11/17 1020 12/10/17 1506     Exercise Goal Re-Evaluation   Exercise Goals Review  Increase Strength and Stamina;Increase Physical Activity;Able to understand and use Dyspnea scale;Able to understand and use rate of perceived exertion (RPE) scale;Knowledge and understanding of Target Heart Rate Range (THRR);Understanding of Exercise Prescription  Increase Strength and Stamina;Increase Physical Activity;Able to understand and use Dyspnea scale;Able to understand and use rate of perceived exertion (RPE) scale;Knowledge and understanding of Target Heart Rate Range (THRR);Understanding of Exercise Prescription  Increase Strength and  Stamina;Increase Physical Activity;Able to understand and use Dyspnea scale;Able to understand and use rate of perceived exertion (RPE) scale;Knowledge and understanding of Target Heart Rate Range (THRR);Understanding of Exercise Prescription  Increase Strength and Stamina;Increase Physical Activity;Able to understand and use rate of perceived exertion (RPE) scale;Knowledge and understanding of Target Heart Rate Range (THRR);Understanding of Exercise Prescription;Able to understand and use Dyspnea scale  Increase Strength and Stamina;Increase Physical Activity;Able to understand and use Dyspnea scale;Able to understand and use rate of perceived exertion (RPE) scale;Knowledge and understanding of Target Heart Rate Range (THRR);Understanding of Exercise Prescription   Comments  Patient has only attended 2 exercise sessions. Will cont. to monitor and progress as able.   Patient has only attended three exercise sessions. Will cont. to monitor and progress as able.   Patient is progressing well in rehab. Patient is exercising at home. Will cont. to monitor and progress as able.   Patient is progressing well in rehab. Patient is exercising at home. Will cont. to monitor and progress as able.   Patient's RPE (rate of perceived exertion) is a majority of the time at 13 (somewhat hard) and above. Patient is about to motivate herself. Is exercising at home. Will cont. to monitor and motivate as possible.    Expected Outcomes  Through exercise at rehab and at home, patient will increase strength and stamina and will find that ADL's are easier to preform.   Through exercise at rehab and at home, patient will increase strength and stamina making ADL's easier to perform. Patient will also have a better understanding of safe exercise and what they are capable to do outside of clinical supervision.  Through exercise at rehab and at home, patient will increase strength and stamina making ADL's easier to perform. Patient will also  have a better understanding of safe exercise and what they are capable to do outside of clinical supervision.  Through exercise at rehab and at home, patient will increase strength and stamina making ADL's easier to perform. Patient will also have a better understanding of safe exercise and what they are capable to do outside of clinical supervision.  Through exercise at rehab and at home, patient will increase endurance and strength. Patient will also be able to perform ADL's with less shortness of breath and fatigue.      Discharge Exercise Prescription (Final Exercise Prescription Changes): Exercise Prescription Changes - 12/09/17 1500      Response to Exercise   Blood  Pressure (Admit)  130/52    Blood Pressure (Exercise)  134/56    Blood Pressure (Exit)  112/60    Heart Rate (Admit)  70 bpm    Heart Rate (Exercise)  98 bpm    Heart Rate (Exit)  78 bpm    Oxygen Saturation (Admit)  94 %    Oxygen Saturation (Exercise)  92 %    Oxygen Saturation (Exit)  94 %    Rating of Perceived Exertion (Exercise)  16    Perceived Dyspnea (Exercise)  2    Duration  Progress to 45 minutes of aerobic exercise without signs/symptoms of physical distress    Intensity  THRR unchanged      Progression   Progression  Continue to progress workloads to maintain intensity without signs/symptoms of physical distress.      Resistance Training   Training Prescription  Yes    Weight  orange bands    Reps  10-15    Time  10 Minutes      Bike   Level  3.5    Minutes  17      Track   Laps  18    Minutes  17       Nutrition:  Target Goals: Understanding of nutrition guidelines, daily intake of sodium <1521m, cholesterol <2044m calories 30% from fat and 7% or less from saturated fats, daily to have 5 or more servings of fruits and vegetables.  Biometrics: Pre Biometrics - 09/03/17 1144      Pre Biometrics   Grip Strength  16 kg        Nutrition Therapy Plan and Nutrition Goals: Nutrition  Therapy & Goals - 10/07/17 1445      Nutrition Therapy   Diet  Carb Modified, Mediterranean      Personal Nutrition Goals   Nutrition Goal  Identify food quantities necessary to achieve wt loss of  -2# per week to a goal wt loss of 6-24 lb at graduation from pulmonary rehab.    Personal Goal #2  Pt wants to work toward being off DM medication.      Intervention Plan   Intervention  Prescribe, educate and counsel regarding individualized specific dietary modifications aiming towards targeted core components such as weight, hypertension, lipid management, diabetes, heart failure and other comorbidities.;Nutrition handout(s) given to patient. ADA handouts re: Pre-diabetes and Diabetes    Expected Outcomes  Short Term Goal: Understand basic principles of dietary content, such as calories, fat, sodium, cholesterol and nutrients.;Long Term Goal: Adherence to prescribed nutrition plan.       Nutrition Assessments: Nutrition Assessments - 10/22/17 0909      Rate Your Plate Scores   Pre Score  69       Nutrition Goals Re-Evaluation:   Nutrition Goals Discharge (Final Nutrition Goals Re-Evaluation):   Psychosocial: Target Goals: Acknowledge presence or absence of significant depression and/or stress, maximize coping skills, provide positive support system. Participant is able to verbalize types and ability to use techniques and skills needed for reducing stress and depression.  Initial Review & Psychosocial Screening: Initial Psych Review & Screening - 09/03/17 1200      Initial Review   Current issues with  None Identified      Family Dynamics   Good Support System?  Yes      Barriers   Psychosocial barriers to participate in program  There are no identifiable barriers or psychosocial needs.       Quality of Life Scores:  Scores  of 19 and below usually indicate a poorer quality of life in these areas.  A difference of  2-3 points is a clinically meaningful difference.  A  difference of 2-3 points in the total score of the Quality of Life Index has been associated with significant improvement in overall quality of life, self-image, physical symptoms, and general health in studies assessing change in quality of life.   PHQ-9: Recent Review Flowsheet Data    Depression screen [facility] 2/9 09/03/2017 08/21/2015 03/04/2015 01/22/2015 10/12/2014   Decreased Interest 0 0 0 0 0   Down, Depressed, Hopeless 0 0 0 0 0   PHQ - 2 Score 0 0 0 0 0     Interpretation of Total Score  Total Score Depression Severity:  1-4 = Minimal depression, 5-9 = Mild depression, 10-14 = Moderate depression, 15-19 = Moderately severe depression, 20-27 = Severe depression   Psychosocial Evaluation and Intervention: Psychosocial Evaluation - 09/03/17 1200      Psychosocial Evaluation & Interventions   Interventions  Encouraged to exercise with the program and follow exercise prescription    Expected Outcomes  patient will remain free from psychosocial barriers during participation in pulmonary rehab    Continue Psychosocial Services   No Follow up required       Psychosocial Re-Evaluation: Psychosocial Re-Evaluation    [place] Name 10/05/17 0914 10/28/17 0958 11/12/17 0910 12/13/17 1052       Psychosocial Re-Evaluation   Current issues with  None Identified  None Identified  None Identified  None Identified    Expected Outcomes  -  -  no barriers for participation in program  no barriers for participation in program    Interventions  Encouraged to attend Cardiac Rehabilitation for the exercise  Encouraged to attend Pulmonary Rehabilitation for the exercise  Encouraged to attend Pulmonary Rehabilitation for the exercise  Encouraged to attend Pulmonary Rehabilitation for the exercise    Continue Psychosocial Services   No Follow up required  No Follow up required  No Follow up required  No Follow up required       Psychosocial Discharge (Final Psychosocial Re-Evaluation): Psychosocial  Re-Evaluation - 12/13/17 1052      Psychosocial Re-Evaluation   Current issues with  None Identified    Expected Outcomes  no barriers for participation in program    Interventions  Encouraged to attend Pulmonary Rehabilitation for the exercise    Continue Psychosocial Services   No Follow up required       Education: Education Goals: Education classes will be provided on a weekly basis, covering required topics. Participant will state understanding/return demonstration of topics presented.  Learning Barriers/Preferences: Learning Barriers/Preferences - 09/03/17 1158      Learning Barriers/Preferences   Learning Barriers  None    Learning Preferences  Written Material;Verbal Instruction       Education Topics: Risk Factor Reduction:  -Group instruction that is supported by a PowerPoint presentation. Instructor discusses the definition of a risk factor, different risk factors for pulmonary disease, and how the heart and lungs work together.     Nutrition for Pulmonary Patient:  -Group instruction provided by PowerPoint slides, verbal discussion, and written materials to support subject matter. The instructor gives an explanation and review of healthy diet recommendations, which includes a discussion on weight management, recommendations for fruit and vegetable consumption, as well as protein, fluid, caffeine, fiber, sodium, sugar, and alcohol. Tips for eating when patients are short of breath are discussed.   Pursed Lip Breathing:  -  Group instruction that is supported by demonstration and informational handouts. Instructor discusses the benefits of pursed lip and diaphragmatic breathing and detailed demonstration on how to preform both.     PULMONARY REHAB OTHER RESPIRATORY from 12/09/2017 in [place]  Date  12/09/17  Educator  pharm D  Instruction Review Code  2- meets goals/outcomes      Oxygen Safety:  -Group instruction provided by  PowerPoint, verbal discussion, and written material to support subject matter. There is an overview of "What is Oxygen" and "Why do we need it".  Instructor also reviews how to create a safe environment for oxygen use, the importance of using oxygen as prescribed, and the risks of noncompliance. There is a brief discussion on traveling with oxygen and resources the patient may utilize.   Oxygen Equipment:  -Group instruction provided by [facility] Staff utilizing handouts, written materials, and equipment demonstrations.   PULMONARY REHAB OTHER RESPIRATORY from 12/09/2017 in [place]  Date  10/21/17  Educator  Ace Gins  Instruction Review Code  2- meets goals/outcomes      Signs and Symptoms:  -Group instruction provided by written material and verbal discussion to support subject matter. Warning signs and symptoms of infection, stroke, and heart attack are reviewed and when to call the physician/911 reinforced. Tips for preventing the spread of infection discussed.   Advanced Directives:  -Group instruction provided by verbal instruction and written material to support subject matter. Instructor reviews Advanced Directive laws and proper instruction for filling out document.   Pulmonary Video:  -Group video education that reviews the importance of medication and oxygen compliance, exercise, good nutrition, pulmonary hygiene, and pursed lip and diaphragmatic breathing for the pulmonary patient.   Exercise for the Pulmonary Patient:  -Group instruction that is supported by a PowerPoint presentation. Instructor discusses benefits of exercise, core components of exercise, frequency, duration, and intensity of an exercise routine, importance of utilizing pulse oximetry during exercise, safety while exercising, and options of places to exercise outside of rehab.     Pulmonary Medications:  -Verbally interactive group education provided by instructor with focus  on inhaled medications and proper administration.   Anatomy and Physiology of the Respiratory System and Intimacy:  -Group instruction provided by PowerPoint, verbal discussion, and written material to support subject matter. Instructor reviews respiratory cycle and anatomical components of the respiratory system and their functions. Instructor also reviews differences in obstructive and restrictive respiratory diseases with examples of each. Intimacy, Sex, and Sexuality differences are reviewed with a discussion on how relationships can change when diagnosed with pulmonary disease. Common sexual concerns are reviewed.   PULMONARY REHAB OTHER RESPIRATORY from 12/09/2017 in [place]  Date  11/11/17  Educator  rn  Instruction Review Code  2- meets goals/outcomes      MD DAY -A group question and answer session with a medical doctor that allows participants to ask questions that relate to their pulmonary disease state.   PULMONARY REHAB OTHER RESPIRATORY from 12/09/2017 in [place]  Date  12/02/17  Educator  Dr. Nelda Marseille  Instruction Review Code  2- meets goals/outcomes      OTHER EDUCATION -Group or individual verbal, written, or video instructions that support the educational goals of the pulmonary rehab program.   PULMONARY REHAB OTHER RESPIRATORY from 12/09/2017 in [place]  Date  -- [Holiday Eating]  Educator  Parke Simmers  Instruction  Review Code  1- Verbalizes Understanding      Knowledge Questionnaire Score: Knowledge Questionnaire Score - 09/08/17 0944      Knowledge Questionnaire Score   Pre Score  13/13       Core Components/Risk Factors/Patient Goals at Admission: Personal Goals and Risk Factors at Admission - 09/03/17 1159      Core Components/Risk Factors/Patient Goals on Admission    Weight Management  Weight Loss;Yes    Intervention  Weight Management: Develop a combined  nutrition and exercise program designed to reach desired caloric intake, while maintaining appropriate intake of nutrient and fiber, sodium and fats, and appropriate energy expenditure required for the weight goal.;Weight Management: Provide education and appropriate resources to help participant work on and attain dietary goals.;Weight Management/Obesity: Establish reasonable short term and long term weight goals.    Expected Outcomes  Short Term: Continue to assess and modify interventions until short term weight is achieved;Weight Loss: Understanding of general recommendations for a balanced deficit meal plan, which promotes 1-2 lb weight loss per week and includes a negative energy balance of [phone] kcal/d;Understanding recommendations for meals to include 15-35% energy as protein, 25-35% energy from fat, 35-60% energy from carbohydrates, less than 244m of dietary cholesterol, 20-35 gm of total fiber daily;Understanding of distribution of calorie intake throughout the day with the consumption of 4-5 meals/snacks    Improve shortness of breath with ADL's  Yes    Intervention  Provide education, individualized exercise plan and daily activity instruction to help decrease symptoms of SOB with activities of daily living.    Expected Outcomes  Short Term: Achieves a reduction of symptoms when performing activities of daily living.       Core Components/Risk Factors/Patient Goals Review:  Goals and Risk Factor Review    Row Name 10/05/17 0913 10/28/17 0956 11/12/17 0909 12/13/17 1050       Core Components/Risk Factors/Patient Goals Review   Personal Goals Review  Weight Management/Obesity;Improve shortness of breath with ADL's  Weight Management/Obesity;Improve shortness of breath with ADL's  Weight Management/Obesity;Improve shortness of breath with ADL's  Weight Management/Obesity;Improve shortness of breath with ADL's    Review  has only attended 2 exercise sessions, too early to see progression  towards goals  Progressing well!!!! She is much stronger than last time she was in the program, she had a hip replacement and is able to exercise with ease now.  No weight loss at this time.  Maintaing her weight, conscious of weight and CBG levels, works hard!  maintaining weight, walking 18 laps on track, level 3.5 on recumbent bike, level 3 on rower, progressing well, continues to work hard    Expected Outcomes  should see more results in the next 30 days  Continued progress toward goals  Continued progress toward goals  see admission goals       Core Components/Risk Factors/Patient Goals at Discharge (Final Review):  Goals and Risk Factor Review - 12/13/17 1050      Core Components/Risk Factors/Patient Goals Review   Personal Goals Review  Weight Management/Obesity;Improve shortness of breath with ADL's    Review  maintaining weight, walking 18 laps on track, level 3.5 on recumbent bike, level 3 on rower, progressing well, continues to work hard    Expected Outcomes  see admission goals       ITP Comments:   Comments: ITP REVIEW Pt is making expected progress toward pulmonary rehab goals after completing 15 sessions. Recommend continued exercise, life style modification, education, and utilization  of breathing techniques to increase stamina and strength and decrease shortness of breath with exertion.

## 2017-12-14 NOTE — Progress Notes (Signed)
Daily Session Note  Patient Details  Name: Rhonda [name] MRN: [id number] Date of Birth: 07/14/1951 Referring Provider:     Pulmonary Rehab Walk Test from 09/09/2017 in [place]  Referring Provider  Dr. Chase Caller      Encounter Date: 12/14/2017  Check In: Session Check In - 12/14/17 1327      Check-In   Location  [facility]    Staff Present  Rosebud Poles, RN, Luisa Hart, RN, BSN;Molly diVincenzo, MS, ACSM RCEP, Exercise Physiologist;Clebert Wenger Ysidro Evert, RN    Supervising physician immediately available to respond to emergencies  [facility] immediately available    Physician(s)  Dr. Zigmund Daniel    Medication changes reported      No    Fall or balance concerns reported     No    Tobacco Cessation  No Change    Warm-up and Cool-down  Performed as group-led instruction    Resistance Training Performed  Yes    VAD Patient?  No      Pain Assessment   Currently in Pain?  No/denies    Multiple Pain Sites  No       Capillary Blood Glucose: No results found for this or any previous visit (from the past 24 hour(s)). POCT Glucose - 12/14/17 1525      POCT Blood Glucose   Pre-Exercise  123 mg/dL    Post-Exercise  94 mg/dL      Exercise Prescription Changes - 12/14/17 1500      Response to Exercise   Blood Pressure (Admit)  110/62    Blood Pressure (Exercise)  100/60    Blood Pressure (Exit)  102/50    Heart Rate (Admit)  99 bpm    Heart Rate (Exercise)  98 bpm    Heart Rate (Exit)  76 bpm    Oxygen Saturation (Admit)  96 %    Oxygen Saturation (Exercise)  90 %    Oxygen Saturation (Exit)  95 %    Rating of Perceived Exertion (Exercise)  17    Perceived Dyspnea (Exercise)  2    Duration  Progress to 45 minutes of aerobic exercise without signs/symptoms of physical distress    Intensity  THRR unchanged      Progression   Progression  Continue to progress workloads to maintain intensity without signs/symptoms of  physical distress.      Resistance Training   Training Prescription  Yes    Weight  orange bands    Reps  10-15    Time  10 Minutes      Bike   Level  3.5    Minutes  17      Rower   Level  3    Minutes  17      Track   Laps  14    Minutes  17       Social History   Tobacco Use  Smoking Status Never Smoker  Smokeless Tobacco Never Used  Tobacco Comment   pt states she experimented in college    Goals Met:  Exercise tolerated well No report of cardiac concerns or symptoms Strength training completed today  Goals Unmet:  Not Applicable  Comments: Service time is from 1330 to 1500    Dr. Rush Farmer is Medical Director for Pulmonary Rehab at [facility].

## 2017-12-15 ENCOUNTER — Ambulatory Visit: Payer: Medicare Other | Admitting: Internal Medicine

## 2017-12-16 ENCOUNTER — Encounter (HOSPITAL_COMMUNITY)
Admission: RE | Admit: 2017-12-16 | Discharge: 2017-12-16 | Disposition: A | Discharge status: Home / Self Care | Payer: Medicare Other | Source: Ambulatory Visit | Attending: Internal Medicine | Admitting: Internal Medicine

## 2017-12-16 VITALS — Wt 142.6 lb

## 2017-12-16 DIAGNOSIS — J849 Interstitial pulmonary disease, unspecified: Secondary | ICD-10-CM | POA: Diagnosis not present

## 2017-12-16 NOTE — Progress Notes (Signed)
Daily Session Note  Patient Details  Name: Rhonda [name] MRN: [id number] Date of Birth: [birth year] Referring Provider:     Pulmonary Rehab Walk Test from 09/09/2017 in [place]  Referring Provider  Dr. Chase Caller      Encounter Date: 12/16/2017  Check In: Session Check In - 12/16/17 1330      Check-In   Location  [facility]    Staff Present  Rosebud Poles, RN, BSN;Molly diVincenzo, MS, ACSM RCEP, Exercise Physiologist;Portia Rollene Rotunda, RN, BSN    Supervising physician immediately available to respond to emergencies  [facility] immediately available    Physician(s)  Dr. Bonner Puna    Medication changes reported      No    Fall or balance concerns reported     No    Tobacco Cessation  No Change    Warm-up and Cool-down  Performed as group-led instruction    Resistance Training Performed  Yes    VAD Patient?  No      Pain Assessment   Currently in Pain?  No/denies    Multiple Pain Sites  No       Capillary Blood Glucose: No results found for this or any previous visit (from the past 24 hour(s)). POCT Glucose - 12/16/17 1626      POCT Blood Glucose   Pre-Exercise  190 mg/dL    Post-Exercise  130 mg/dL      Exercise Prescription Changes - 12/16/17 1600      Response to Exercise   Blood Pressure (Admit)  110/54    Blood Pressure (Exercise)  110/64    Blood Pressure (Exit)  104/62    Heart Rate (Admit)  80 bpm    Heart Rate (Exercise)  100 bpm    Heart Rate (Exit)  78 bpm    Oxygen Saturation (Admit)  9494 %    Oxygen Saturation (Exercise)  92 %    Oxygen Saturation (Exit)  95 %    Rating of Perceived Exertion (Exercise)  15    Perceived Dyspnea (Exercise)  2    Duration  Progress to 45 minutes of aerobic exercise without signs/symptoms of physical distress    Intensity  THRR unchanged      Progression   Progression  Continue to progress workloads to maintain intensity without signs/symptoms of physical distress.       Resistance Training   Training Prescription  Yes    Weight  orange bands    Reps  10-15    Time  10 Minutes      Bike   Level  3.5    Minutes  17      Rower   Level  3    Minutes  17       Social History   Tobacco Use  Smoking Status Never Smoker  Smokeless Tobacco Never Used  Tobacco Comment   pt states she experimented in college    Goals Met:  Exercise tolerated well Strength training completed today  Goals Unmet:  Not Applicable  Comments: Service time is from 1330 to 1530    Dr. Rush Farmer is Medical Director for Pulmonary Rehab at [facility].

## 2017-12-21 ENCOUNTER — Encounter (HOSPITAL_COMMUNITY)
Admission: RE | Admit: 2017-12-21 | Discharge: 2017-12-21 | Disposition: A | Discharge status: Home / Self Care | Payer: Medicare Other | Source: Ambulatory Visit | Attending: Internal Medicine | Admitting: Internal Medicine

## 2017-12-21 VITALS — Wt 142.2 lb

## 2017-12-21 DIAGNOSIS — J849 Interstitial pulmonary disease, unspecified: Secondary | ICD-10-CM | POA: Diagnosis not present

## 2017-12-21 NOTE — Progress Notes (Signed)
Daily Session Note  Patient Details  Name: Rhonda [name] MRN: [id number] Date of Birth: 03-12-1951 Referring Provider:     Pulmonary Rehab Walk Test from 09/09/2017 in [place]  Referring Provider  Dr. Chase Caller      Encounter Date: 12/21/2017  Check In: Session Check In - 12/21/17 1545      Check-In   Location  [facility]    Staff Present  Rosebud Poles, RN, BSN;Molly diVincenzo, MS, ACSM RCEP, Exercise Physiologist;[name] Ysidro Evert, RN    Supervising physician immediately available to respond to emergencies  [facility] immediately available    Physician(s)  Dr. Bonner Puna    Medication changes reported      No    Fall or balance concerns reported     No    Tobacco Cessation  No Change    Warm-up and Cool-down  Performed as group-led instruction    Resistance Training Performed  Yes    VAD Patient?  No      Pain Assessment   Currently in Pain?  No/denies    Multiple Pain Sites  No       Capillary Blood Glucose: No results found for this or any previous visit (from the past 24 hour(s)). POCT Glucose - 12/21/17 1555      POCT Blood Glucose   Pre-Exercise  201 mg/dL    Post-Exercise  97 mg/dL      Exercise Prescription Changes - 12/21/17 1553      Response to Exercise   Blood Pressure (Admit)  100/50    Blood Pressure (Exercise)  126/48    Blood Pressure (Exit)  100/56    Heart Rate (Admit)  75 bpm    Heart Rate (Exercise)  99 bpm    Heart Rate (Exit)  84 bpm    Oxygen Saturation (Admit)  97 %    Oxygen Saturation (Exercise)  92 %    Oxygen Saturation (Exit)  94 %    Rating of Perceived Exertion (Exercise)  15    Perceived Dyspnea (Exercise)  2    Duration  Progress to 45 minutes of aerobic exercise without signs/symptoms of physical distress    Intensity  THRR unchanged      Progression   Progression  Continue to progress workloads to maintain intensity without signs/symptoms of physical distress.      Resistance Training   Training Prescription  Yes    Weight  orange bands    Reps  10-15    Time  10 Minutes      Bike   Level  3.5    Minutes  17      Rower   Level  4    Minutes  17      Track   Laps  21    Minutes  17       Social History   Tobacco Use  Smoking Status Never Smoker  Smokeless Tobacco Never Used  Tobacco Comment   pt states she experimented in college    Goals Met:  Independence with exercise equipment Improved SOB with ADL's Using PLB without cueing & demonstrates good technique Exercise tolerated well No report of cardiac concerns or symptoms Strength training completed today  Goals Unmet:  Not Applicable  Comments: Service time is from 1330 to 1510    Dr. Rush Farmer is Medical Director for Pulmonary Rehab at [facility].

## 2017-12-23 ENCOUNTER — Encounter (HOSPITAL_COMMUNITY)
Admission: RE | Admit: 2017-12-23 | Discharge: 2017-12-23 | Disposition: A | Discharge status: Home / Self Care | Payer: Medicare Other | Source: Ambulatory Visit | Attending: Internal Medicine | Admitting: Internal Medicine

## 2017-12-23 VITALS — Wt 142.6 lb

## 2017-12-23 DIAGNOSIS — J849 Interstitial pulmonary disease, unspecified: Secondary | ICD-10-CM | POA: Diagnosis not present

## 2017-12-23 NOTE — Progress Notes (Signed)
Daily Session Note  Patient Details  Name: Rhonda [name] MRN: [id number] Date of Birth: [birth year] Referring Provider:     Pulmonary Rehab Walk Test from 09/09/2017 in [place]  Referring Provider  Dr. Chase Caller      Encounter Date: 12/23/2017  Check In: Session Check In - 12/23/17 1230      Check-In   Location  [facility]    Staff Present  Rosebud Poles, RN, BSN;Molly diVincenzo, MS, ACSM RCEP, Exercise Physiologist;[name] Ysidro Evert, RN;Portia Rollene Rotunda, RN, BSN    Supervising physician immediately available to respond to emergencies  [facility] immediately available    Physician(s)  Dr. Tyrell Antonio    Medication changes reported      No    Fall or balance concerns reported     No    Tobacco Cessation  No Change    Warm-up and Cool-down  Performed as group-led instruction    Resistance Training Performed  Yes    VAD Patient?  No      Pain Assessment   Currently in Pain?  No/denies    Multiple Pain Sites  No       Capillary Blood Glucose: No results found for this or any previous visit (from the past 24 hour(s)).    Social History   Tobacco Use  Smoking Status Never Smoker  Smokeless Tobacco Never Used  Tobacco Comment   pt states she experimented in college    Goals Met:  Exercise tolerated well Strength training completed today  Goals Unmet:  Not Applicable  Comments: Service time is from 1230 to 1445.  She attended the Mental Health class today.    Dr. Rush Farmer is Medical Director for Pulmonary Rehab at [facility]

## 2017-12-28 ENCOUNTER — Encounter (HOSPITAL_COMMUNITY): Payer: Medicare Other

## 2017-12-30 ENCOUNTER — Encounter (HOSPITAL_COMMUNITY)
Admission: RE | Admit: 2017-12-30 | Discharge: 2017-12-30 | Disposition: A | Discharge status: Home / Self Care | Payer: Medicare Other | Source: Ambulatory Visit | Attending: Internal Medicine | Admitting: Internal Medicine

## 2017-12-30 DIAGNOSIS — J849 Interstitial pulmonary disease, unspecified: Secondary | ICD-10-CM | POA: Insufficient documentation

## 2018-01-04 ENCOUNTER — Encounter (HOSPITAL_COMMUNITY)
Admission: RE | Admit: 2018-01-04 | Discharge: 2018-01-04 | Disposition: A | Discharge status: Home / Self Care | Payer: Medicare Other | Source: Ambulatory Visit | Attending: Internal Medicine | Admitting: Internal Medicine

## 2018-01-04 DIAGNOSIS — J849 Interstitial pulmonary disease, unspecified: Secondary | ICD-10-CM | POA: Diagnosis not present

## 2018-01-04 NOTE — Progress Notes (Signed)
Daily Session Note  Patient Details  Name: KINDEL ROCHEFORT MRN: [id number] Date of Birth: [birth year] Referring Provider:     Pulmonary Rehab Walk Test from 09/09/2017 in [place]  Referring Provider  Dr. Chase Caller      Encounter Date: 01/04/2018  Check In: Session Check In - 01/04/18 1323      Check-In   Location  [facility]    Staff Present  Rosebud Poles, RN, BSN;Molly diVincenzo, MS, ACSM RCEP, Exercise Physiologist;[name] Ysidro Evert, RN;Rafaelita Foister Rollene Rotunda, RN, BSN    Supervising physician immediately available to respond to emergencies  [facility] immediately available    Physician(s)  Dr. Lonny Prude    Medication changes reported      No    Fall or balance concerns reported     No    Tobacco Cessation  No Change    Warm-up and Cool-down  Performed as group-led instruction    Resistance Training Performed  Yes    VAD Patient?  No      Pain Assessment   Currently in Pain?  No/denies    Multiple Pain Sites  No       Capillary Blood Glucose: No results found for this or any previous visit (from the past 24 hour(s)).    Social History   Tobacco Use  Smoking Status Never Smoker  Smokeless Tobacco Never Used  Tobacco Comment   pt states she experimented in college    Goals Met:  Independence with exercise equipment Improved SOB with ADL's Using PLB without cueing & demonstrates good technique Exercise tolerated well No report of cardiac concerns or symptoms Strength training completed today  Goals Unmet:  Not Applicable  Comments: Service time is from 1330 to 1510   Dr. Rush Farmer is Medical Director for Pulmonary Rehab at [facility].

## 2018-01-04 NOTE — Progress Notes (Signed)
Pulmonary Individual Treatment Plan  Patient Details  Name: Rhonda [name] MRN: [id number] Date of Birth: 04/30/1951 Referring Provider:     Pulmonary Rehab Walk Test from 09/09/2017 in [facility]  Referring Provider  Dr. Chase Caller      Initial Encounter Date:    Pulmonary Rehab Walk Test from 09/09/2017 in [facility]  Date  09/09/17  Referring Provider  Dr. Chase Caller      Visit Diagnosis: ILD (interstitial lung disease) ([place])  Patient's Home Medications on Admission:   Current Outpatient Medications:  .  acetaminophen (TYLENOL) 325 MG tablet, Take 650 mg by mouth every 6 (six) hours as needed. Reported on 02/10/2016, Disp: , Rfl:  .  albuterol (PROAIR HFA) 108 (90 Base) MCG/ACT inhaler, Inhale 2 puffs into the lungs every 4 (four) hours as needed for wheezing. Or coughing spells.  You may use 2 Puffs 5-10 minutes before exercise., Disp: 1 Inhaler, Rfl: 3 .  Alpha-D-Galactosidase (BEANO PO), Take 1 tablet by mouth as needed (if eating onion or garlic). , Disp: , Rfl:  .  AMBULATORY NON FORMULARY MEDICATION, Cinnsulin cinnamon caplsule 1 by mouth daily, Disp: , Rfl:  .  Ascorbic Acid (VITAMIN C) 1000 MG tablet, Take 1,000 mg by mouth daily., Disp: , Rfl:  .  BREO ELLIPTA 100-25 MCG/INH AEPB, TAKE 1 PUFF BY MOUTH EVERY DAY, Disp: 180 each, Rfl: 1 .  Calcium Citrate-Vitamin D (CALCITRATE PLUS D PO), Take 1 tablet by mouth daily., Disp: , Rfl:  .  Calcium-Vitamin D-Vitamin K (VIACTIV) 268-341-96 MG-UNT-MCG CHEW, Chew 1 tablet by mouth daily., Disp: , Rfl:  .  chlorpheniramine (CHLOR-TRIMETON) 4 MG tablet, Take 4 mg by mouth daily as needed for allergies. , Disp: , Rfl:  .  dicyclomine (BENTYL) 10 MG capsule, Take 10 mg by mouth daily. Reported on 01/06/2016, Disp: , Rfl:  .  ezetimibe (ZETIA) 10 MG tablet, Take 1 tablet (10 mg total) by mouth daily., Disp: 30 tablet, Rfl: 11 .  fluticasone (FLONASE) 50 MCG/ACT nasal spray,  SPRAY 2 SPRAYS INTO EACH NOSTRIL EVERY DAY, Disp: 1 g, Rfl: 12 .  metFORMIN (GLUCOPHAGE-XR) 500 MG 24 hr tablet, Take 500 mg by mouth daily., Disp: , Rfl:  .  metoprolol succinate (TOPROL-XL) 50 MG 24 hr tablet, TAKE 1 TABLET BY MOUTH EVERY DAY IMMEDIATELY FOLLOWING A MEAL. (Patient taking differently: Take 25 mg by mouth daily. TAKE 1 TABLET BY MOUTH EVERY DAY IMMEDIATELY FOLLOWING A MEAL.), Disp: 30 tablet, Rfl: 0 .  montelukast (SINGULAIR) 10 MG tablet, Take 1 tablet (10 mg total) by mouth daily., Disp: 30 tablet, Rfl: 0 .  Multiple Vitamin (MULTIVITAMIN) tablet, Take 1 tablet by mouth daily., Disp: , Rfl:  .  Naproxen Sodium (ALEVE) 220 MG CAPS, Take 1-2 capsules by mouth as needed., Disp: , Rfl:  .  Omega-3 Fatty Acids (FISH OIL) 1000 MG CAPS, Take 1,000 mg by mouth daily., Disp: , Rfl:  .  omeprazole (PRILOSEC) 20 MG capsule, Take 1 capsule (20 mg total) by mouth daily. (Patient taking differently: Take 20 mg by mouth daily. Patient takes 10 mg), Disp: 30 capsule, Rfl: 5 .  pravastatin (PRAVACHOL) 40 MG tablet, Take 40 mg by mouth daily., Disp: , Rfl:  .  predniSONE (DELTASONE) 10 MG tablet, Take 1 tablet (10 mg total) daily with breakfast by mouth., Disp: 60 tablet, Rfl: 2 .  predniSONE (DELTASONE) 5 MG tablet, Take 5 mg by mouth daily with breakfast., Disp: ,  Rfl:  .  Probiotic Product (PROBIOTIC ADVANCED PO), Take 1 capsule by mouth daily. , Disp: , Rfl:  .  Turmeric 500 MG CAPS, Take 1 capsule by mouth daily., Disp: , Rfl:   Past Medical History: Past Medical History:  Diagnosis Date  . Allergy   . Arthritis    history spinal stenosis. osteoarthritis right hip  . Asthma   . Cataract   . Coronary artery calcification seen on CAT scan 08/19/2017   >300 on CT scan 08/2017  . Depression   . DOE (dyspnea on exertion)    a. 04/2010 Lexi MV EF 71%, no ischemia/infarct;    . GERD (gastroesophageal reflux disease)   . H/O steroid therapy    Steroid use orally over 4 yrs- for Lung  Fibrosis  . Heart palpitations 02/28/2015  . Helicobacter pylori ab+   . Hiatal hernia   . High cholesterol   . History of chronic bronchitis    as child  . Hyperlipidemia, mixed 08/19/2017  . Hyperplastic colon polyp 2007  . Inguinal hernia    right  . Insulin resistance    past  . Interstitial lung disease ([place])   . MVP (mitral valve prolapse)    Posterior mitral valve leaflet with mild MR  . Pneumonitis, hypersensitivity ([place])    a. 09/2012 s/p Bx - ? 2/2 bird, mold, oil paint exposure ->on steroids, followed by pulm.  . Pulmonary fibrosis ([facility])    Dr. Chase Caller follows- stable at present  . Pulmonary HTN ([place])    mild PASP 14mHg on echo 02/2015  . PVC (premature ventricular contraction) 08/19/2017  . Rapid heart rate    Dr. TRadford Paxfollows- last visit Epic note 9'16    Tobacco Use: Social History   Tobacco Use  Smoking Status Never Smoker  Smokeless Tobacco Never Used  Tobacco Comment   pt states she experimented in college    Labs: Recent Review Flowsheet Data    Labs for ITP Cardiac and Pulmonary Rehab Latest Ref Rng & Units 10/12/2014 01/22/2015 02/26/2015 08/21/2015 11/08/2017   Cholestrol 100 - 199 mg/dL 176 - 184 - 180   LDLCALC 0 - 99 mg/dL 79 - 94 - 77   HDL >39 mg/dL 50 - 53 - 71   Trlycerides 0 - 149 mg/dL 236(H) - 185(H) - 158(H)   Hemoglobin A1c <5.7 % - 5.9 - 6.1(H) -   PHART 7.350 - 7.450 - - - - -   PCO2ART 35.0 - 45.0 mmHg - - - - -   HCO3 20.0 - 24.0 mEq/L - - - - -   TCO2 0 - 100 mmol/L - - - - -   ACIDBASEDEF 0.0 - 2.0 mmol/L - - - - -   O2SAT % - - - - -      Capillary Blood Glucose: Lab Results  Component Value Date   GLUCAP 110 (H) 10/01/2012   POCT Glucose    Row Name 09/28/17 1523 09/30/17 1549 10/12/17 1522 10/26/17 1502 10/28/17 1609     POCT Blood Glucose   Pre-Exercise  157 mg/dL  152 mg/dL  125 mg/dL  143 mg/dL  208 mg/dL   Post-Exercise  136 mg/dL  109 mg/dL  98 mg/dL  95 mg/dL  108 mg/dL   Row Name 11/04/17 1531 12/02/17 1555  12/07/17 1510 12/14/17 1525 12/16/17 1626     POCT Blood Glucose   Pre-Exercise  169 mg/dL  200 mg/dL  169 mg/dL  123 mg/dL  190 mg/dL  Post-Exercise  126 mg/dL  130 mg/dL  94 mg/dL  94 mg/dL  130 mg/dL   Row Name 12/21/17 1555 12/23/17 0949 12/23/17 1535         POCT Blood Glucose   Pre-Exercise  201 mg/dL  180 mg/dL  180 mg/dL     Post-Exercise  97 mg/dL  124 mg/dL  124 mg/dL        Pulmonary Assessment Scores: Pulmonary Assessment Scores    Row Name 09/08/17 0944 09/09/17 1632       ADL UCSD   ADL Phase  Entry  -    SOB Score total  21  -      CAT Score   CAT Score  13 Entry  -      mMRC Score   mMRC Score  -  1       Pulmonary Function Assessment: Pulmonary Function Assessment - 09/03/17 1158      Breath   Bilateral Breath Sounds  Clear    Shortness of Breath  Yes;Limiting activity extreme exertion or high elevations       Exercise Target Goals:    Exercise Program Goal: Individual exercise prescription set using results from initial 6 min walk test and THRR while considering  patient's activity barriers and safety.    Exercise Prescription Goal: Initial exercise prescription builds to 30-45 minutes a day of aerobic activity, 2-3 days per week.  Home exercise guidelines will be given to patient during program as part of exercise prescription that the participant will acknowledge.  Activity Barriers & Risk Stratification: Activity Barriers & Cardiac Risk Stratification - 09/03/17 1138      Activity Barriers & Cardiac Risk Stratification   Activity Barriers  Right Hip Replacement;Shortness of Breath       6 Minute Walk: 6 Minute Walk    Row Name 09/09/17 1616         6 Minute Walk   Phase  Initial     Distance  1200 feet     Walk Time  6 minutes     # of Rest Breaks  0     MPH  2.27     METS  3.07     RPE  10     Perceived Dyspnea   1     Symptoms  No     Resting HR  87 bpm     Resting BP  130/78     Resting Oxygen Saturation   95 %      Exercise Oxygen Saturation  during 6 min walk  88 %     Max Ex. HR  109 bpm     Max Ex. BP  148/79       Interval HR   1 Minute HR  87     2 Minute HR  100     3 Minute HR  105     4 Minute HR  109     5 Minute HR  105     6 Minute HR  105     2 Minute Post HR  88     Interval Heart Rate?  Yes       Interval Oxygen   Interval Oxygen?  Yes     Baseline Oxygen Saturation %  95 %     1 Minute Oxygen Saturation %  93 %     1 Minute Liters of Oxygen  0 L     2 Minute Oxygen Saturation %  90 %     2 Minute Liters of Oxygen  0 L     3 Minute Oxygen Saturation %  89 %     3 Minute Liters of Oxygen  0 L     4 Minute Oxygen Saturation %  88 %     4 Minute Liters of Oxygen  0 L     5 Minute Oxygen Saturation %  89 %     5 Minute Liters of Oxygen  0 L     6 Minute Oxygen Saturation %  90 %     6 Minute Liters of Oxygen  0 L     2 Minute Post Oxygen Saturation %  92 %     2 Minute Post Liters of Oxygen  0 L        Oxygen Initial Assessment: Oxygen Initial Assessment - 12/13/17 1015      Home Oxygen   Home Oxygen Device  Portable Concentrator;Home Concentrator    Home Exercise Oxygen Prescription  None    Home at Rest Exercise Oxygen Prescription  None    Compliance with Home Oxygen Use  Yes      Initial 6 min Walk   Oxygen Used  None      Program Oxygen Prescription   Program Oxygen Prescription  None       Oxygen Re-Evaluation: Oxygen Re-Evaluation    Row Name 10/05/17 0907 10/28/17 0954 11/12/17 0907 12/13/17 1016 12/31/17 1008     Program Oxygen Prescription   Program Oxygen Prescription  None  None  None  -  None     Home Oxygen   Home Oxygen Device  Portable Concentrator;Home Concentrator  Portable Concentrator;Home Concentrator  Portable Concentrator;Home Concentrator does not need oxygen, but has the equipment from past use  -  Portable Concentrator;Home Concentrator   Sleep Oxygen Prescription  None  None  None  -  None   Home Exercise Oxygen Prescription  None  has not required oxygen while exercising in pulmonary rehab  None  None  -  None   Liters per minute  -  0  0  -  0   Home at Rest Exercise Oxygen Prescription  None  None  None  -  None   Compliance with Home Oxygen Use  Yes  -  -  -  Yes     Goals/Expected Outcomes   Short Term Goals  To learn and exhibit compliance with exercise, home and travel O2 prescription  -  -  To learn and exhibit compliance with exercise, home and travel O2 prescription  To learn and exhibit compliance with exercise, home and travel O2 prescription   Long  Term Goals  Exhibits compliance with exercise, home and travel O2 prescription;Verbalizes importance of monitoring SPO2 with pulse oximeter and return demonstration;Exhibits proper breathing techniques, such as pursed lip breathing or other method taught during program session;Maintenance of O2 saturations>88%;Compliance with respiratory medication;Demonstrates proper use of MDI's  -  -  Exhibits compliance with exercise, home and travel O2 prescription;Verbalizes importance of monitoring SPO2 with pulse oximeter and return demonstration;Exhibits proper breathing techniques, such as pursed lip breathing or other method taught during program session;Maintenance of O2 saturations>88%;Compliance with respiratory medication;Demonstrates proper use of MDI's  Exhibits compliance with exercise, home and travel O2 prescription;Verbalizes importance of monitoring SPO2 with pulse oximeter and return demonstration;Exhibits proper breathing techniques, such as pursed lip breathing or other method taught during program session;Maintenance of O2 saturations>88%;Compliance with respiratory  medication;Demonstrates proper use of MDI's   Comments  -  Is not using oxygen now, but did require it n the past  -  Is not using oxygen now, but did require it n the past  Is not using oxygen now, but did require it in the past   Goals/Expected Outcomes  -  -  -  n/a  -      Oxygen Discharge  (Final Oxygen Re-Evaluation): Oxygen Re-Evaluation - 12/31/17 1008      Program Oxygen Prescription   Program Oxygen Prescription  None      Home Oxygen   Home Oxygen Device  Portable Concentrator;Home Concentrator    Sleep Oxygen Prescription  None    Home Exercise Oxygen Prescription  None    Liters per minute  0    Home at Rest Exercise Oxygen Prescription  None    Compliance with Home Oxygen Use  Yes      Goals/Expected Outcomes   Short Term Goals  To learn and exhibit compliance with exercise, home and travel O2 prescription    Long  Term Goals  Exhibits compliance with exercise, home and travel O2 prescription;Verbalizes importance of monitoring SPO2 with pulse oximeter and return demonstration;Exhibits proper breathing techniques, such as pursed lip breathing or other method taught during program session;Maintenance of O2 saturations>88%;Compliance with respiratory medication;Demonstrates proper use of MDI's    Comments  Is not using oxygen now, but did require it in the past       Initial Exercise Prescription: Initial Exercise Prescription - 09/09/17 1600      Date of Initial Exercise RX and Referring Provider   Date  09/09/17    Referring Provider  Dr. Hilarie Fredrickson   Level  0.4    Minutes  17      Rower   Level  1    Minutes  17      Track   Laps  10    Minutes  17      Prescription Details   Frequency (times per week)  2    Duration  Progress to 45 minutes of aerobic exercise without signs/symptoms of physical distress      Intensity   THRR 40-80% of Max Heartrate  61-122    Ratings of Perceived Exertion  11-13    Perceived Dyspnea  0-4      Progression   Progression  Continue progressive overload as per policy without signs/symptoms or physical distress.      Resistance Training   Training Prescription  Yes    Weight  orange bands    Reps  10-15       Perform Capillary Blood Glucose checks as needed.  Exercise Prescription  Changes: Exercise Prescription Changes    Row Name 09/28/17 1500 09/30/17 1548 10/07/17 1500 10/12/17 1500 10/19/17 1541     Response to Exercise   Blood Pressure (Admit)  104/48  102/50  100/54  108/56  98/56   Blood Pressure (Exercise)  146/54  138/74  142/60  130/64  110/46   Blood Pressure (Exit)  90/46  118/64  114/60  120/64  110/54   Heart Rate (Admit)  91 bpm  77 bpm  72 bpm  72 bpm  80 bpm   Heart Rate (Exercise)  105 bpm  92 bpm  105 bpm  99 bpm  96 bpm   Heart Rate (Exit)  89 bpm  84 bpm  82 bpm  70 bpm  85 bpm   Oxygen Saturation (Admit)  92 %  93 %  95 %  94 %  95 %   Oxygen Saturation (Exercise)  90 %  92 %  89 %  92 %  92 %   Oxygen Saturation (Exit)  95 %  92 %  95 %  95 %  92 %   Rating of Perceived Exertion (Exercise)  '15  13  12  17  17   '$ Perceived Dyspnea (Exercise)  2  1  0  3  2   Duration  Progress to 45 minutes of aerobic exercise without signs/symptoms of physical distress  Progress to 45 minutes of aerobic exercise without signs/symptoms of physical distress  Progress to 45 minutes of aerobic exercise without signs/symptoms of physical distress  Progress to 45 minutes of aerobic exercise without signs/symptoms of physical distress  Progress to 45 minutes of aerobic exercise without signs/symptoms of physical distress   Intensity  Other (comment) 40-80% of HRR  Other (comment) 40-80% of HRR  THRR unchanged  THRR unchanged  THRR unchanged     Progression   Progression  Continue to progress workloads to maintain intensity without signs/symptoms of physical distress.  Continue to progress workloads to maintain intensity without signs/symptoms of physical distress.  Continue to progress workloads to maintain intensity without signs/symptoms of physical distress.  Continue to progress workloads to maintain intensity without signs/symptoms of physical distress.  Continue to progress workloads to maintain intensity without signs/symptoms of physical distress.     Resistance  Training   Training Prescription  Yes  Yes  Yes  Yes  Yes   Weight  orange bands  orange bands  orange bands  orange bands  orange bands   Reps  10-15  10-15  10-15  10-15  10-15   Time  10 Minutes  10 Minutes  10 Minutes  10 Minutes  10 Minutes     Bike   Level  3  -  3  3.5  3.5   Minutes  17  -  '17  17  17     '$ Rower   Level  '1  1  1  1  2   '$ Minutes  '17  17  17  17  17     '$ Track   Laps  '15  13  7  15  16   '$ Minutes  '17  17  17  17  17     '$ Home Exercise Plan   Plans to continue exercise at  -  -  [employer] (comment)  -  -   Frequency  -  -  Add 3 additional days to program exercise sessions.  -  -   Row Name 10/21/17 1600 10/26/17 1500 10/28/17 1608 11/04/17 1530 11/09/17 1500     Response to Exercise   Blood Pressure (Admit)  106/56  92/50  108/50  128/60  100/54   Blood Pressure (Exercise)  120/62  140/70  146/66  120/50  -   Blood Pressure (Exit)  102/60  100/56  102/70  90/60  102/54   Heart Rate (Admit)  71 bpm  76 bpm  75 bpm  74 bpm  72 bpm   Heart Rate (Exercise)  92 bpm  93 bpm  97 bpm  86 bpm  93 bpm   Heart Rate (Exit)  75 bpm  69 bpm  82 bpm  76 bpm  80 bpm   Oxygen Saturation (Admit)  95 %  94 %  93 %  97 %  93 %   Oxygen Saturation (Exercise)  91 %  91 %  92 %  91 %  91 %   Oxygen Saturation (Exit)  95 %  96 %  94 %  95 %  94 %   Rating of Perceived Exertion (Exercise)  '17  17  17  16  16   '$ Perceived Dyspnea (Exercise)  '3  3  3  2  2   '$ Duration  Progress to 45 minutes of aerobic exercise without signs/symptoms of physical distress  Progress to 45 minutes of aerobic exercise without signs/symptoms of physical distress  Progress to 45 minutes of aerobic exercise without signs/symptoms of physical distress  Progress to 45 minutes of aerobic exercise without signs/symptoms of physical distress  Progress to 45 minutes of aerobic exercise without signs/symptoms of physical distress   Intensity  THRR unchanged  THRR unchanged  THRR unchanged  THRR unchanged  THRR  unchanged     Progression   Progression  Continue to progress workloads to maintain intensity without signs/symptoms of physical distress.  Continue to progress workloads to maintain intensity without signs/symptoms of physical distress.  Continue to progress workloads to maintain intensity without signs/symptoms of physical distress.  Continue to progress workloads to maintain intensity without signs/symptoms of physical distress.  Continue to progress workloads to maintain intensity without signs/symptoms of physical distress.     Resistance Training   Training Prescription  Yes  Yes  Yes  Yes  Yes   Weight  orange bands  orange bands  orange bands  orange bands  orange bands   Reps  10-15  10-15  10-15  10-15  10-15   Time  10 Minutes  10 Minutes  10 Minutes  10 Minutes  10 Minutes     Bike   Level  3.5  3.5  3.5  3.5  3.5   Minutes  '17  17  17  17  17     '$ Rower   Level  -  2  -  2  2   Minutes  -  17  -  17  17     Track   Laps  '11  13  15  13  14   '$ Minutes  '17  17  17  17  17   '$ Row Name 11/11/17 1614 11/30/17 1600 12/02/17 1551 12/07/17 1500 12/09/17 1500     Response to Exercise   Blood Pressure (Admit)  100/54  108/56  110/52  110/60  130/52   Blood Pressure (Exercise)  130/52  100/60  124/60  122/70  134/56   Blood Pressure (Exit)  110/64  106/52  100/60  100/60  112/60   Heart Rate (Admit)  74 bpm  75 bpm  72 bpm  78 bpm  70 bpm   Heart Rate (Exercise)  93 bpm  97 bpm  95 bpm  96 bpm  98 bpm   Heart Rate (Exit)  78 bpm  79 bpm  75 bpm  80 bpm  78 bpm   Oxygen Saturation (Admit)  94 %  95 %  94 %  98 %  94 %   Oxygen Saturation (Exercise)  88 %  91 %  91 %  92 %  92 %   Oxygen Saturation (Exit)  96 %  94 %  95 %  94 %  94 %   Rating of Perceived Exertion (  Exercise)  '16  15  15  16  16   '$ Perceived Dyspnea (Exercise)  '3  2  3  2  2   '$ Duration  Progress to 45 minutes of aerobic exercise without signs/symptoms of physical distress  Progress to 45 minutes of aerobic exercise  without signs/symptoms of physical distress  Progress to 45 minutes of aerobic exercise without signs/symptoms of physical distress  Progress to 45 minutes of aerobic exercise without signs/symptoms of physical distress  Progress to 45 minutes of aerobic exercise without signs/symptoms of physical distress   Intensity  THRR unchanged  THRR unchanged  THRR unchanged  THRR unchanged  THRR unchanged     Progression   Progression  Continue to progress workloads to maintain intensity without signs/symptoms of physical distress.  Continue to progress workloads to maintain intensity without signs/symptoms of physical distress.  Continue to progress workloads to maintain intensity without signs/symptoms of physical distress.  Continue to progress workloads to maintain intensity without signs/symptoms of physical distress.  Continue to progress workloads to maintain intensity without signs/symptoms of physical distress.     Resistance Training   Training Prescription  Yes  Yes  Yes  Yes  Yes   Weight  orange bands  orange bands  orange bands  orange bands  orange bands   Reps  10-15  10-15  10-15  10-15  10-15   Time  10 Minutes  10 Minutes  10 Minutes  10 Minutes  10 Minutes     Bike   Level  3.5  3.5  -  3.5  3.5   Minutes  17  17  -  17  17     Rower   Level  '2  2  3  3  '$ -   Minutes  '17  17  17  17  '$ -     Track   Laps  -  '15  17  12  18   '$ Minutes  -  '17  17  17  17   '$ Row Name 12/14/17 1500 12/16/17 1600 12/21/17 1553 12/23/17 0947       Response to Exercise   Blood Pressure (Admit)  110/62  110/54  100/50  114/50    Blood Pressure (Exercise)  100/60  110/64  126/48  124/50    Blood Pressure (Exit)  102/50  104/62  100/56  130/60    Heart Rate (Admit)  99 bpm  80 bpm  75 bpm  70 bpm    Heart Rate (Exercise)  98 bpm  100 bpm  99 bpm  90 bpm    Heart Rate (Exit)  76 bpm  78 bpm  84 bpm  80 bpm    Oxygen Saturation (Admit)  96 %  9494 %  97 %  94 %    Oxygen Saturation (Exercise)  90 %  92 %   92 %  91 %    Oxygen Saturation (Exit)  95 %  95 %  94 %  95 %    Rating of Perceived Exertion (Exercise)  '17  15  15  12    '$ Perceived Dyspnea (Exercise)  '2  2  2  1    '$ Duration  Progress to 45 minutes of aerobic exercise without signs/symptoms of physical distress  Progress to 45 minutes of aerobic exercise without signs/symptoms of physical distress  Progress to 45 minutes of aerobic exercise without signs/symptoms of physical distress  Progress to 45 minutes of aerobic exercise  without signs/symptoms of physical distress    Intensity  THRR unchanged  THRR unchanged  THRR unchanged  THRR unchanged      Progression   Progression  Continue to progress workloads to maintain intensity without signs/symptoms of physical distress.  Continue to progress workloads to maintain intensity without signs/symptoms of physical distress.  Continue to progress workloads to maintain intensity without signs/symptoms of physical distress.  Continue to progress workloads to maintain intensity without signs/symptoms of physical distress.      Resistance Training   Training Prescription  Yes  Yes  Yes  Yes    Weight  orange bands  orange bands  orange bands  orange bands    Reps  10-15  10-15  10-15  10-15    Time  10 Minutes  10 Minutes  10 Minutes  10 Minutes      Bike   Level  3.5  3.5  3.5  -    Minutes  '17  17  17  '$ -      Rower   Level  '3  3  4  4    '$ Minutes  '17  17  17  17      '$ Track   Laps  14  -  21  17    Minutes  17  -  17  17       Exercise Comments: Exercise Comments    Row Name 10/08/17 0853           Exercise Comments  Home exercise completed          Exercise Goals and Review: Exercise Goals    Row Name 09/03/17 1143             Exercise Goals   Increase Physical Activity  Yes       Intervention  Provide advice, education, support and counseling about physical activity/exercise needs.;Develop an individualized exercise prescription for aerobic and resistive training based  on initial evaluation findings, risk stratification, comorbidities and participant's personal goals.       Expected Outcomes  Achievement of increased cardiorespiratory fitness and enhanced flexibility, muscular endurance and strength shown through measurements of functional capacity and personal statement of participant.       Increase Strength and Stamina  Yes       Intervention  Provide advice, education, support and counseling about physical activity/exercise needs.;Develop an individualized exercise prescription for aerobic and resistive training based on initial evaluation findings, risk stratification, comorbidities and participant's personal goals.       Expected Outcomes  Achievement of increased cardiorespiratory fitness and enhanced flexibility, muscular endurance and strength shown through measurements of functional capacity and personal statement of participant.       Able to understand and use rate of perceived exertion (RPE) scale  Yes       Intervention  Provide education and explanation on how to use RPE scale       Expected Outcomes  Short Term: Able to use RPE daily in rehab to express subjective intensity level;Long Term:  Able to use RPE to guide intensity level when exercising independently       Able to understand and use Dyspnea scale  Yes       Intervention  Provide education and explanation on how to use Dyspnea scale       Expected Outcomes  Short Term: Able to use Dyspnea scale daily in rehab to express subjective sense of shortness of breath during exertion;Long Term: Able  to use Dyspnea scale to guide intensity level when exercising independently       Knowledge and understanding of Target Heart Rate Range (THRR)  Yes       Intervention  Provide education and explanation of THRR including how the numbers were predicted and where they are located for reference       Expected Outcomes  Short Term: Able to state/look up THRR;Long Term: Able to use THRR to govern intensity when  exercising independently;Short Term: Able to use daily as guideline for intensity in rehab       Understanding of Exercise Prescription  Yes       Intervention  Provide education, explanation, and written materials on patient's individual exercise prescription       Expected Outcomes  Short Term: Able to explain program exercise prescription;Long Term: Able to explain home exercise prescription to exercise independently          Exercise Goals Re-Evaluation : Exercise Goals Re-Evaluation    [place] Name 10/04/17 1649 10/07/17 1646 10/25/17 1554 11/11/17 1020 12/10/17 1506     Exercise Goal Re-Evaluation   Exercise Goals Review  Increase Strength and Stamina;Increase Physical Activity;Able to understand and use Dyspnea scale;Able to understand and use rate of perceived exertion (RPE) scale;Knowledge and understanding of Target Heart Rate Range (THRR);Understanding of Exercise Prescription  Increase Strength and Stamina;Increase Physical Activity;Able to understand and use Dyspnea scale;Able to understand and use rate of perceived exertion (RPE) scale;Knowledge and understanding of Target Heart Rate Range (THRR);Understanding of Exercise Prescription  Increase Strength and Stamina;Increase Physical Activity;Able to understand and use Dyspnea scale;Able to understand and use rate of perceived exertion (RPE) scale;Knowledge and understanding of Target Heart Rate Range (THRR);Understanding of Exercise Prescription  Increase Strength and Stamina;Increase Physical Activity;Able to understand and use rate of perceived exertion (RPE) scale;Knowledge and understanding of Target Heart Rate Range (THRR);Understanding of Exercise Prescription;Able to understand and use Dyspnea scale  Increase Strength and Stamina;Increase Physical Activity;Able to understand and use Dyspnea scale;Able to understand and use rate of perceived exertion (RPE) scale;Knowledge and understanding of Target Heart Rate Range (THRR);Understanding  of Exercise Prescription   Comments  Patient has only attended 2 exercise sessions. Will cont. to monitor and progress as able.   Patient has only attended three exercise sessions. Will cont. to monitor and progress as able.   Patient is progressing well in rehab. Patient is exercising at home. Will cont. to monitor and progress as able.   Patient is progressing well in rehab. Patient is exercising at home. Will cont. to monitor and progress as able.   Patient's RPE (rate of perceived exertion) is a majority of the time at 13 (somewhat hard) and above. Patient is about to motivate herself. Is exercising at home. Will cont. to monitor and motivate as possible.    Expected Outcomes  Through exercise at rehab and at home, patient will increase strength and stamina and will find that ADL's are easier to preform.   Through exercise at rehab and at home, patient will increase strength and stamina making ADL's easier to perform. Patient will also have a better understanding of safe exercise and what they are capable to do outside of clinical supervision.  Through exercise at rehab and at home, patient will increase strength and stamina making ADL's easier to perform. Patient will also have a better understanding of safe exercise and what they are capable to do outside of clinical supervision.  Through exercise at rehab and at home, patient  will increase strength and stamina making ADL's easier to perform. Patient will also have a better understanding of safe exercise and what they are capable to do outside of clinical supervision.  Through exercise at rehab and at home, patient will increase endurance and strength. Patient will also be able to perform ADL's with less shortness of breath and fatigue.   [place] Name 12/31/17 1009             Exercise Goal Re-Evaluation   Exercise Goals Review  Increase Strength and Stamina;Increase Physical Activity;Able to understand and use Dyspnea scale;Able to understand and use  rate of perceived exertion (RPE) scale;Knowledge and understanding of Target Heart Rate Range (THRR);Understanding of Exercise Prescription       Comments  Patient is progressing well. Will be graduating over the next few weeks. Will cont. to exercise at the [facility].        Expected Outcomes  Through exercise at rehab and at home, patient will increase endurance and strength. Patient will also be able to perform ADL's with less shortness of breath and fatigue.          Discharge Exercise Prescription (Final Exercise Prescription Changes): Exercise Prescription Changes - 12/23/17 0947      Response to Exercise   Blood Pressure (Admit)  114/50    Blood Pressure (Exercise)  124/50    Blood Pressure (Exit)  130/60    Heart Rate (Admit)  70 bpm    Heart Rate (Exercise)  90 bpm    Heart Rate (Exit)  80 bpm    Oxygen Saturation (Admit)  94 %    Oxygen Saturation (Exercise)  91 %    Oxygen Saturation (Exit)  95 %    Rating of Perceived Exertion (Exercise)  12    Perceived Dyspnea (Exercise)  1    Duration  Progress to 45 minutes of aerobic exercise without signs/symptoms of physical distress    Intensity  THRR unchanged      Progression   Progression  Continue to progress workloads to maintain intensity without signs/symptoms of physical distress.      Resistance Training   Training Prescription  Yes    Weight  orange bands    Reps  10-15    Time  10 Minutes      Rower   Level  4    Minutes  17      Track   Laps  17    Minutes  17       Nutrition:  Target Goals: Understanding of nutrition guidelines, daily intake of sodium '1500mg'$ , cholesterol '200mg'$ , calories 30% from fat and 7% or less from saturated fats, daily to have 5 or more servings of fruits and vegetables.  Biometrics: Pre Biometrics - 09/03/17 1144      Pre Biometrics   Grip Strength  16 kg        Nutrition Therapy Plan and Nutrition Goals: Nutrition Therapy & Goals - 10/07/17 1445      Nutrition Therapy    Diet  Carb Modified, Mediterranean      Personal Nutrition Goals   Nutrition Goal  Identify food quantities necessary to achieve wt loss of  -2# per week to a goal wt loss of 6-24 lb at graduation from pulmonary rehab.    Personal Goal #2  Pt wants to work toward being off DM medication.      Intervention Plan   Intervention  Prescribe, educate and counsel regarding individualized specific dietary modifications aiming towards  targeted core components such as weight, hypertension, lipid management, diabetes, heart failure and other comorbidities.;Nutrition handout(s) given to patient. ADA handouts re: Pre-diabetes and Diabetes    Expected Outcomes  Short Term Goal: Understand basic principles of dietary content, such as calories, fat, sodium, cholesterol and nutrients.;Long Term Goal: Adherence to prescribed nutrition plan.       Nutrition Assessments: Nutrition Assessments - 10/22/17 0909      Rate Your Plate Scores   Pre Score  69       Nutrition Goals Re-Evaluation:   Nutrition Goals Discharge (Final Nutrition Goals Re-Evaluation):   Psychosocial: Target Goals: Acknowledge presence or absence of significant depression and/or stress, maximize coping skills, provide positive support system. Participant is able to verbalize types and ability to use techniques and skills needed for reducing stress and depression.  Initial Review & Psychosocial Screening: Initial Psych Review & Screening - 09/03/17 1200      Initial Review   Current issues with  None Identified      Family Dynamics   Good Support System?  Yes      Barriers   Psychosocial barriers to participate in program  There are no identifiable barriers or psychosocial needs.       Quality of Life Scores:  Scores of 19 and below usually indicate a poorer quality of life in these areas.  A difference of  2-3 points is a clinically meaningful difference.  A difference of 2-3 points in the total score of the Quality of  Life Index has been associated with significant improvement in overall quality of life, self-image, physical symptoms, and general health in studies assessing change in quality of life.   PHQ-9: Recent Review Flowsheet Data    Depression screen [facility] 2/9 09/03/2017 08/21/2015 03/04/2015 01/22/2015 10/12/2014   Decreased Interest 0 0 0 0 0   Down, Depressed, Hopeless 0 0 0 0 0   PHQ - 2 Score 0 0 0 0 0     Interpretation of Total Score  Total Score Depression Severity:  1-4 = Minimal depression, 5-9 = Mild depression, 10-14 = Moderate depression, 15-19 = Moderately severe depression, 20-27 = Severe depression   Psychosocial Evaluation and Intervention: Psychosocial Evaluation - 09/03/17 1200      Psychosocial Evaluation & Interventions   Interventions  Encouraged to exercise with the program and follow exercise prescription    Expected Outcomes  patient will remain free from psychosocial barriers during participation in pulmonary rehab    Continue Psychosocial Services   No Follow up required       Psychosocial Re-Evaluation: Psychosocial Re-Evaluation    [place] Name 10/05/17 0914 10/28/17 0958 11/12/17 0910 12/13/17 1052 01/04/18 0914     Psychosocial Re-Evaluation   Current issues with  None Identified  None Identified  None Identified  None Identified  None Identified   Expected Outcomes  -  -  no barriers for participation in program  no barriers for participation in program  no barriers for participation in program   Interventions  Encouraged to attend Cardiac Rehabilitation for the exercise  Encouraged to attend Pulmonary Rehabilitation for the exercise  Encouraged to attend Pulmonary Rehabilitation for the exercise  Encouraged to attend Pulmonary Rehabilitation for the exercise  Encouraged to attend Pulmonary Rehabilitation for the exercise   Continue Psychosocial Services   No Follow up required  No Follow up required  No Follow up required  No Follow up required  No Follow up required  Psychosocial Discharge (Final Psychosocial Re-Evaluation): Psychosocial Re-Evaluation - 01/04/18 0914      Psychosocial Re-Evaluation   Current issues with  None Identified    Expected Outcomes  no barriers for participation in program    Interventions  Encouraged to attend Pulmonary Rehabilitation for the exercise    Continue Psychosocial Services   No Follow up required       Education: Education Goals: Education classes will be provided on a weekly basis, covering required topics. Participant will state understanding/return demonstration of topics presented.  Learning Barriers/Preferences: Learning Barriers/Preferences - 09/03/17 1158      Learning Barriers/Preferences   Learning Barriers  None    Learning Preferences  Written Material;Verbal Instruction       Education Topics: Risk Factor Reduction:  -Group instruction that is supported by a PowerPoint presentation. Instructor discusses the definition of a risk factor, different risk factors for pulmonary disease, and how the heart and lungs work together.     Nutrition for Pulmonary Patient:  -Group instruction provided by PowerPoint slides, verbal discussion, and written materials to support subject matter. The instructor gives an explanation and review of healthy diet recommendations, which includes a discussion on weight management, recommendations for fruit and vegetable consumption, as well as protein, fluid, caffeine, fiber, sodium, sugar, and alcohol. Tips for eating when patients are short of breath are discussed.   Pursed Lip Breathing:  -Group instruction that is supported by demonstration and informational handouts. Instructor discusses the benefits of pursed lip and diaphragmatic breathing and detailed demonstration on how to preform both.     PULMONARY REHAB OTHER RESPIRATORY from 12/30/2017 in [place]  Date  12/09/17  Educator  pharm D      Oxygen Safety:  -Group  instruction provided by PowerPoint, verbal discussion, and written material to support subject matter. There is an overview of "What is Oxygen" and "Why do we need it".  Instructor also reviews how to create a safe environment for oxygen use, the importance of using oxygen as prescribed, and the risks of noncompliance. There is a brief discussion on traveling with oxygen and resources the patient may utilize.   PULMONARY REHAB OTHER RESPIRATORY from 12/30/2017 in [place]  Date  12/30/17  Educator  RN  Instruction Review Code  2- Demonstrated Understanding      Oxygen Equipment:  -Group instruction provided by [facility] Staff utilizing handouts, written materials, and equipment demonstrations.   PULMONARY REHAB OTHER RESPIRATORY from 12/30/2017 in [place]  Date  10/21/17  Educator  Ace Gins      Signs and Symptoms:  -Group instruction provided by written material and verbal discussion to support subject matter. Warning signs and symptoms of infection, stroke, and heart attack are reviewed and when to call the physician/911 reinforced. Tips for preventing the spread of infection discussed.   Advanced Directives:  -Group instruction provided by verbal instruction and written material to support subject matter. Instructor reviews Advanced Directive laws and proper instruction for filling out document.   Pulmonary Video:  -Group video education that reviews the importance of medication and oxygen compliance, exercise, good nutrition, pulmonary hygiene, and pursed lip and diaphragmatic breathing for the pulmonary patient.   Exercise for the Pulmonary Patient:  -Group instruction that is supported by a PowerPoint presentation. Instructor discusses benefits of exercise, core components of exercise, frequency, duration, and intensity of an exercise routine, importance of utilizing pulse oximetry during exercise, safety  while  exercising, and options of places to exercise outside of rehab.     Pulmonary Medications:  -Verbally interactive group education provided by instructor with focus on inhaled medications and proper administration.   Anatomy and Physiology of the Respiratory System and Intimacy:  -Group instruction provided by PowerPoint, verbal discussion, and written material to support subject matter. Instructor reviews respiratory cycle and anatomical components of the respiratory system and their functions. Instructor also reviews differences in obstructive and restrictive respiratory diseases with examples of each. Intimacy, Sex, and Sexuality differences are reviewed with a discussion on how relationships can change when diagnosed with pulmonary disease. Common sexual concerns are reviewed.   PULMONARY REHAB OTHER RESPIRATORY from 12/30/2017 in [place]  Date  11/11/17  Educator  rn      MD DAY -A group question and answer session with a medical doctor that allows participants to ask questions that relate to their pulmonary disease state.   PULMONARY REHAB OTHER RESPIRATORY from 12/30/2017 in [place]  Date  12/02/17  Educator  Dr. Nelda Marseille      OTHER EDUCATION -Group or individual verbal, written, or video instructions that support the educational goals of the pulmonary rehab program.   PULMONARY REHAB OTHER RESPIRATORY from 12/30/2017 in [place]  Date  12/16/17 [Holiday Eating]  Educator  Cloyde Reams  Instruction Review Code  1- Verbalizes Understanding [sedentary lifestyles]      Holiday Eating Survival Tips:  -Group instruction provided by PowerPoint slides, verbal discussion, and written materials to support subject matter. The instructor gives patients tips, tricks, and techniques to help them not only survive but enjoy the holidays despite the onslaught of food that accompanies the  holidays.   Knowledge Questionnaire Score: Knowledge Questionnaire Score - 09/08/17 0944      Knowledge Questionnaire Score   Pre Score  13/13       Core Components/Risk Factors/Patient Goals at Admission: Personal Goals and Risk Factors at Admission - 09/03/17 1159      Core Components/Risk Factors/Patient Goals on Admission    Weight Management  Weight Loss;Yes    Intervention  Weight Management: Develop a combined nutrition and exercise program designed to reach desired caloric intake, while maintaining appropriate intake of nutrient and fiber, sodium and fats, and appropriate energy expenditure required for the weight goal.;Weight Management: Provide education and appropriate resources to help participant work on and attain dietary goals.;Weight Management/Obesity: Establish reasonable short term and long term weight goals.    Expected Outcomes  Short Term: Continue to assess and modify interventions until short term weight is achieved;Weight Loss: Understanding of general recommendations for a balanced deficit meal plan, which promotes 1-2 lb weight loss per week and includes a negative energy balance of [phone] kcal/d;Understanding recommendations for meals to include 15-35% energy as protein, 25-35% energy from fat, 35-60% energy from carbohydrates, less than '200mg'$  of dietary cholesterol, 20-35 gm of total fiber daily;Understanding of distribution of calorie intake throughout the day with the consumption of 4-5 meals/snacks    Improve shortness of breath with ADL's  Yes    Intervention  Provide education, individualized exercise plan and daily activity instruction to help decrease symptoms of SOB with activities of daily living.    Expected Outcomes  Short Term: Achieves a reduction of symptoms when performing activities of daily living.       Core Components/Risk Factors/Patient Goals Review:  Goals and Risk Factor Review  [place] Name 10/05/17 0913 10/28/17 0956 11/12/17 0909  12/13/17 1050 01/04/18 0905     Core Components/Risk Factors/Patient Goals Review   Personal Goals Review  Weight Management/Obesity;Improve shortness of breath with ADL's  Weight Management/Obesity;Improve shortness of breath with ADL's  Weight Management/Obesity;Improve shortness of breath with ADL's  Weight Management/Obesity;Improve shortness of breath with ADL's  Weight Management/Obesity;Improve shortness of breath with ADL's   Review  has only attended 2 exercise sessions, too early to see progression towards goals  Progressing well!!!! She is much stronger than last time she was in the program, she had a hip replacement and is able to exercise with ease now.  No weight loss at this time.  Maintaing her weight, conscious of weight and CBG levels, works hard!  maintaining weight, walking 18 laps on track, level 3.5 on recumbent bike, level 3 on rower, progressing well, continues to work hard  for graduation this week , has worked hard will continue exercising at [employer].   Expected Outcomes  should see more results in the next 30 days  Continued progress toward goals  Continued progress toward goals  see admission goals  see admission goals      Core Components/Risk Factors/Patient Goals at Discharge (Final Review):  Goals and Risk Factor Review - 01/04/18 0905      Core Components/Risk Factors/Patient Goals Review   Personal Goals Review  Weight Management/Obesity;Improve shortness of breath with ADL's    Review  for graduation this week , has worked hard will continue exercising at the [facility].    Expected Outcomes  see admission goals       ITP Comments:   Comments: ITP REVIEW Pt is making expected progress toward pulmonary rehab goals after completing 19 sessions. Recommend continued exercise, life style modification, education, and utilization of breathing techniques to increase stamina and strength and decrease shortness of breath with exertion.

## 2018-01-06 ENCOUNTER — Encounter (HOSPITAL_COMMUNITY)
Admission: RE | Admit: 2018-01-06 | Discharge: 2018-01-06 | Disposition: A | Discharge status: Home / Self Care | Payer: Medicare Other | Source: Ambulatory Visit | Attending: Internal Medicine | Admitting: Internal Medicine

## 2018-01-06 DIAGNOSIS — J849 Interstitial pulmonary disease, unspecified: Secondary | ICD-10-CM | POA: Diagnosis not present

## 2018-01-06 NOTE — Progress Notes (Signed)
Daily Session Note  Patient Details  Name: Rhonda [name] MRN: [id number] Date of Birth: Apr 28, 1951 Referring Provider:     Pulmonary Rehab Walk Test from 09/09/2017 in [place]  Referring Provider  Dr. Chase Caller      Encounter Date: 01/06/2018  Check In: Session Check In - 01/06/18 1407      Check-In   Location  [facility]    Staff Present  Su Hilt, MS, ACSM RCEP, Exercise Physiologist;[name] Ysidro Evert, RN;Jaymari Cromie Rollene Rotunda, RN, BSN    Supervising physician immediately available to respond to emergencies  [facility] immediately available    Physician(s)  Dr. Tyrell Antonio    Medication changes reported      No    Fall or balance concerns reported     No    Tobacco Cessation  No Change    Warm-up and Cool-down  Performed as group-led instruction    Resistance Training Performed  Yes    VAD Patient?  No      Pain Assessment   Currently in Pain?  No/denies    Multiple Pain Sites  No       Capillary Blood Glucose: No results found for this or any previous visit (from the past 24 hour(s)).    Social History   Tobacco Use  Smoking Status Never Smoker  Smokeless Tobacco Never Used  Tobacco Comment   pt states she experimented in college    Goals Met:  Independence with exercise equipment Improved SOB with ADL's Using PLB without cueing & demonstrates good technique Exercise tolerated well No report of cardiac concerns or symptoms Strength training completed today  Goals Unmet:  Not Applicable  Comments: Service time is from 1330 to 1535   Dr. Rush Farmer is Medical Director for Pulmonary Rehab at [facility].

## 2018-01-10 DIAGNOSIS — L570 Actinic keratosis: Secondary | ICD-10-CM | POA: Diagnosis not present

## 2018-01-10 DIAGNOSIS — E785 Hyperlipidemia, unspecified: Secondary | ICD-10-CM | POA: Diagnosis not present

## 2018-01-10 DIAGNOSIS — L820 Inflamed seborrheic keratosis: Secondary | ICD-10-CM | POA: Diagnosis not present

## 2018-01-10 DIAGNOSIS — J8410 Pulmonary fibrosis, unspecified: Secondary | ICD-10-CM | POA: Diagnosis not present

## 2018-01-10 DIAGNOSIS — M5414 Radiculopathy, thoracic region: Secondary | ICD-10-CM | POA: Diagnosis not present

## 2018-01-10 DIAGNOSIS — Z0001 Encounter for general adult medical examination with abnormal findings: Secondary | ICD-10-CM | POA: Diagnosis not present

## 2018-01-10 DIAGNOSIS — L821 Other seborrheic keratosis: Secondary | ICD-10-CM | POA: Diagnosis not present

## 2018-01-10 DIAGNOSIS — R7301 Impaired fasting glucose: Secondary | ICD-10-CM | POA: Diagnosis not present

## 2018-01-11 ENCOUNTER — Ambulatory Visit (INDEPENDENT_AMBULATORY_CARE_PROVIDER_SITE_OTHER): Payer: Medicare Other | Admitting: Internal Medicine

## 2018-01-11 ENCOUNTER — Encounter (HOSPITAL_COMMUNITY): Payer: Medicare Other

## 2018-01-11 ENCOUNTER — Encounter (HOSPITAL_COMMUNITY): Payer: Self-pay

## 2018-01-11 ENCOUNTER — Encounter: Payer: Self-pay | Admitting: Internal Medicine

## 2018-01-11 VITALS — BP 124/76 | HR 79 | Ht 60.0 in | Wt 143.0 lb

## 2018-01-11 DIAGNOSIS — J849 Interstitial pulmonary disease, unspecified: Secondary | ICD-10-CM

## 2018-01-11 DIAGNOSIS — Z20828 Contact with and (suspected) exposure to other viral communicable diseases: Secondary | ICD-10-CM | POA: Diagnosis not present

## 2018-01-11 DIAGNOSIS — R0789 Other chest pain: Secondary | ICD-10-CM | POA: Diagnosis not present

## 2018-01-11 DIAGNOSIS — K219 Gastro-esophageal reflux disease without esophagitis: Secondary | ICD-10-CM | POA: Diagnosis not present

## 2018-01-11 DIAGNOSIS — Z23 Encounter for immunization: Secondary | ICD-10-CM

## 2018-01-11 DIAGNOSIS — J679 Hypersensitivity pneumonitis due to unspecified organic dust: Secondary | ICD-10-CM | POA: Diagnosis not present

## 2018-01-11 LAB — PULMONARY FUNCTION TEST
DL/VA % PRED: 95 %
DL/VA: 4.06 ml/min/mmHg/L
DLCO unc % pred: 56 %
DLCO unc: 10.57 ml/min/mmHg
FEF 25-75 Pre: 2.25 L/sec
FEF2575-%PRED-PRE: 130 %
FEV1-%PRED-PRE: 75 %
FEV1-Pre: 1.46 L
FEV1FVC-%PRED-PRE: 119 %
FEV6-%Pred-Pre: 66 %
FEV6-PRE: 1.61 L
FEV6FVC-%Pred-Pre: 105 %
FVC-%Pred-Pre: 62 %
FVC-PRE: 1.61 L
Pre FEV1/FVC ratio: 91 %
Pre FEV6/FVC Ratio: 100 %

## 2018-01-11 MED ORDER — OSELTAMIVIR PHOSPHATE 75 MG PO CAPS: 75.0000 mg | ORAL_CAPSULE | Freq: Two times a day (BID) | ORAL | 0 refills | Status: DC

## 2018-01-11 MED ORDER — PNEUMOCOCCAL VAC POLYVALENT 25 MCG/0.5ML IJ INJ
0.5000 mL | INJECTION | Freq: Once | INTRAMUSCULAR | Status: AC
  Administered 2018-01-11: 0.5 mL via INTRAMUSCULAR

## 2018-01-11 NOTE — Patient Instructions (Addendum)
ILD (interstitial lung disease) ([place]) Hypersensitivity pneumonitis (HCC)  - very slow decline in ILD over years - return o2 to [facility] - some increase in cough due to weather change - agree will watch without antibiotics or prednisone burrst  - continue delsym as needed and daily prednisone 10mg  per day - in 3 months do Pre-bd spiro and dlco only. No lung volume or bd response. No post-bd spiro   Gastroesophageal reflux disease, esophagitis presence not specified  - re-refer Dr Hilarie Fredrickson GI; address question of eosinohilic esophagitis with hm  Right-sided chest wall pain  - probably muscle  Pull from recent cough  - heat pads - if worse will do cxr  Exposure to the flu - tamiflu 75mg  per day x 10 days   Followuo 3 months ILD clinic with Dr Chase Caller

## 2018-01-11 NOTE — Progress Notes (Signed)
Subjective:     Patient ID: Rhonda [name], female   DOB: 11/12/1951, 67 y.o.   MRN: [id number]  HPI  #GE reflux with small hiatal hernia  - on ppi   #History of rapid heart rate not otherwise specified  - Start her on Lopressor  2008/2009 by primary care physician. History appears to correlate with onset of respiratory issues  - refuses to dc this drug as of 2014 discussion   # Hypersensitivity Pneumonitis and ILD  - Potential etiologies - cockateil x 2 for 18 years through end 2012. 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 Denies mold but lives in house built in [place] with a "weird baselment: and has humidifier  - first noted on CT chest 10/15/09 following trip to [place] (PE negative) but not described in 2003 CT chest report  - autoimmune panel: 10/23/11: Negative  -  Uderwennt bronch 11/19/11  - non diagnostic  - VATS Nov 2013 (done after initially refusing)- [place]. However, there is worrying trend of UIP pattern in the Upper lobes.            Oct/Nov 2012  March 2013 08/11/12  Nov/Dec 2013 Dx HP/UIP 01/02/2013  02/28/13 05/25/2013  12/06/2013  03/15/2014  06/29/2014  10'27/15 12/19/2014  02/26/15 01/13/2016  04/29/2016  10/23/2016  01/25/2017  06/17/2017 [facility] July 2018 01/11/2018   Symp/Signs Dyspnea x5y  New crackles  dimished dyspnea except at hill. Improved crackles.  Improved dyspnea except @ hill               FVC  2.6 L    2.2 L/82%   2.1 L/80%   2.19 L/82%  2.2L/91% 2.1/81% 1.86/69% 1.9L/71% 1.86/75% 1.75L/65% 1.82/73% (dec 2015 offce spiro) 2.84L/72% 1.54/61% 1.78L/67%  1.69L/65%  1.61/62%  DLCO  10/50%    10/51%   9.6/51%     11.9/63% 11.7/62%  11.561%  5.5/31% 10.06/57% 11/04/63%  10.43/55% 8.56/50% 10.57/61%  Walk test 185 ft x 3 laps on RA  rest 92%. Pk exertion 90%. Pk HR 108 Rest 92%. Pk exertion 86% at 2.5 laps. Pk HR 102  pk exertion - 88% Rest 96%. Pk  95%. Pk HR is 75. Done at full dose lopressor Rest 94%. Pk 91%. Rest HR 93 with pk HR 103/min. Done at half dose lopressor REst 96%. Pk pulse ox 92%. Rest HR 65. Pk HR 86 Did not desat. Normal Pk HR  Pulse ox 96%, droped to89% at 2nd lap but bounced up and ended at 91% 3rd lap Did not desat Did not desat, Pk HHR 93  ;llowest pulse ox 90% at 2nd lap but she held her breath, then 92% at 3rd lap 99% -> 91% at end of 3rd lap 94%, Dropped to 87% at 3rd lap, HR peak102/m 97%, dropped to 89% in 3rd lap - asymptomatic. HR 100/min No prob with 22md at [facility] 99% rest - -> dopped to 92% 3rd lap. Pkr HR 107/min  CT chest   unchanged          yes Ct mid dec possibly worse - unsure Ct - similar to April 2016 but worse since 2013   CT 06/24/17 - similar to June 2017   Tests/Bx Bronch 0- non diagnostic   VATS - HP with UIP in Upper lobe                 RX    Rx  pred start 11/23/12 On  pred 23m per day and will reduce to 238mOn Pred 2066mer day On pred 59m72mr day On pred 5mg 3m day Rx pred 5mg d59my She reduced to pred 5mg x 54mt 1 month QOD due to GI concern side effects On pred 5mg qod69mt will increase to 5/d after this visit On pred 5mg dail56m On pred 5mg daily39mncrease pred to 59mg per d54meduce to pred 7.5 due to skin sissues                          OV 06/17/2017  Chief Complaint  Patient presents with  . Follow-up    [name]. Pt denies significant cough, denies CP/tightness, f/c/s.     She is better. Feels she does not need o2. Has been to [facility] for transplant clinic; felt too early. Seems higher dose prednisone helping but then she is also bettter t his time of the year. In May 2018 I was at [facility] and curbsided national thought leaders - feel that we could explore silent active GERD as a possibility of ILD getting wors with time.   OV 01/11/2018  Chief Complaint  Patient presents with  . Follow-up    PFT done today.  Pt states she has been coughing x2 weeks since she  returned from a trip to [place]. [place], pt has had pain in ribs. Pt states she has mild SOB which is stable due to rehab.  Pt states that Dr. Richter is Darron Doom to know if pt could possibly have eosinophilic esophagitis.   Went to [place]. [place] has been coughing x 2 weeks. Associated with this is some rib pain on right side. She feels she does not need o2. She feels she does not need pred/abx at this time. She also exposed to flu wit family  Results for Laramee, PIANA, [id number]) [id number] 08:45  Ref. Range 01/25/2017 10:16 01/11/2018 15:03  FVC-Pre Latest Units: L 1.69 1.61  FVC-%Pred-Pre Latest Units: % 65 62  Results for Bagg, Bird A (MRN [id number]) [id number]/2019 08:45  Ref. Range 01/25/2017 10:16 01/11/2018 15:03  DLCO unc Latest Units: ml/min/mmHg 10.43 10.57  DLCO unc % pred Latest Units: % 55 56     has a past medical history of Allergy, Arthritis, Asthma, Cataract, Coronary artery calcification seen on CAT scan (08/19/2017), Depression, DOE (dyspnea on exertion), GERD (gastroesophageal reflux disease), H/O steroid therapy, Heart palpitations (02/28/2015), 7/7/4128cter pylori ab+, Hiatal hernia, High cholesterol, History of chronic bronchitis, Hyperlipidemia, mixed (08/19/2017), Hyperplastic colon polyp (2007), Inguinal hernia, Insulin resistance, Interstitial lung disease (HCC), MVP ([place] valve prolapse), Pneumonitis, hypersensitivity (HCC), PulmoKnoxvilley fibrosis (HCC), PulmoRidgewayy HTN (HCC), PVC ([place] Field[place] ventricular contraction) (08/19/2017), and Rapid heart rate.   reports that  has never smoked. she has never used smokeless tobacco.  Past Surgical History:  Procedure Laterality Date  . BREAST BIOPSY Right 2009   BIOPSY  . BREAST BIOPSY Left 2003   Benign   . CATARACT EXTRACTION Left   . CESAREAN SECTION    . FOOT FRACTURE SURGERY  2006 or 2007   right  . HYMENECTOMY    . LUNG BIOPSY  09/28/2012   Procedure: LUNG BIOPSY;  Surgeon: Steven C  HMelrose Nakayamation: [facility] OR;  [facility];  Laterality: N/A;  lung biopsies tims three  . SQUAMOUS CELL CARCINOMA EXCISION Left  left arm  . TOTAL HIP ARTHROPLASTY Right 09/10/2015   Procedure: RIGHT TOTAL HIP ARTHROPLASTY ANTERIOR APPROACH;  Surgeon: Paralee Cancel, MD;  Location: [facility];  Service: Orthopedics;  Laterality: Right;  . TUBAL LIGATION    . VIDEO ASSISTED THORACOSCOPY  09/28/2012   Procedure: VIDEO ASSISTED THORACOSCOPY;  Surgeon: Melrose Nakayama, MD;  Location: [facility];  Service: Thoracic;  Laterality: Right;  Marland Kitchen VIDEO BRONCHOSCOPY  11/19/2011   Procedure: VIDEO BRONCHOSCOPY WITH FLUORO;  Surgeon: Brand Males, MD;  [facility];  Service: Endoscopy;;    Allergies  Allergen Reactions  . Atorvastatin Other (See Comments)    Leg pain  . Betadine [Povidone Iodine] Other (See Comments)    blisters  . Codeine Nausea And Vomiting  . Rosuvastatin Other (See Comments)    Leg pain  . Shellfish Allergy Other (See Comments)    vomiting  . Sulfonamide Derivatives Other (See Comments)    headaches    Immunization History  Administered Date(s) Administered  . Hepatitis A 05/23/2010  . Hepatitis B 06/23/2006  . Influenza Split 08/11/2012  . Influenza Whole 09/08/2011  . Influenza, High Dose Seasonal PF 08/24/2016, 07/24/2017  . Influenza,inj,Quad PF,6+ Mos 09/07/2013  . Influenza-Unspecified 09/26/2014, 07/27/2015  . MMR 05/23/2010  . Pneumococcal Conjugate-13 03/15/2014  . Td 02/22/2003  . Tdap 10/28/2012  . Zoster 12/04/2010  . Zoster Recombinat (Shingrix) 02/15/2017, 05/17/2017    Family History  Problem Relation Age of Onset  . Emphysema Maternal Grandmother   . Asthma Maternal Grandmother   . Asthma Mother   . Osteoarthritis Mother   . Dementia Mother 42  . Lymphoma Father   . Diabetes Father   . Hypertension Sister   . Heart disease Sister   . Kidney disease Sister   . Lung disease Maternal Grandfather   . Bone cancer Paternal  Grandfather   . Allergic rhinitis Daughter   . Breast cancer Paternal Aunt   . Ovarian cancer Maternal Aunt   . Breast cancer Cousin   . Colon cancer Neg Hx   . Angioedema Neg Hx   . Eczema Neg Hx   . Immunodeficiency Neg Hx   . Urticaria Neg Hx      Current Outpatient Medications:  .  acetaminophen (TYLENOL) 325 MG tablet, Take 650 mg by mouth every 6 (six) hours as needed. Reported on 02/10/2016, Disp: , Rfl:  .  albuterol (PROAIR HFA) 108 (90 Base) MCG/ACT inhaler, Inhale 2 puffs into the lungs every 4 (four) hours as needed for wheezing. Or coughing spells.  You may use 2 Puffs 5-10 minutes before exercise., Disp: 1 Inhaler, Rfl: 3 .  Alpha-D-Galactosidase (BEANO PO), Take 1 tablet by mouth as needed (if eating onion or garlic). , Disp: , Rfl:  .  AMBULATORY NON FORMULARY MEDICATION, Cinnsulin cinnamon caplsule 1 by mouth daily, Disp: , Rfl:  .  Ascorbic Acid (VITAMIN C) 1000 MG tablet, Take 1,000 mg by mouth daily., Disp: , Rfl:  .  BREO ELLIPTA 100-25 MCG/INH AEPB, TAKE 1 PUFF BY MOUTH EVERY DAY, Disp: 180 each, Rfl: 1 .  Calcium-Vitamin D-Vitamin K (VIACTIV) [phone] MG-UNT-MCG CHEW, Chew 1 tablet by mouth daily., Disp: , Rfl:  .  chlorpheniramine (CHLOR-TRIMETON) 4 MG tablet, Take 4 mg by mouth daily as needed for allergies. , Disp: , Rfl:  .  ezetimibe (ZETIA) 10 MG tablet, Take 10 mg by mouth daily., Disp: , Rfl: 11 .  fluticasone (FLONASE) 50 MCG/ACT nasal spray, SPRAY 2 SPRAYS INTO EACH NOSTRIL  EVERY DAY, Disp: 1 g, Rfl: 12 .  metFORMIN (GLUCOPHAGE-XR) 500 MG 24 hr tablet, Take 500 mg by mouth daily., Disp: , Rfl:  .  metoprolol succinate (TOPROL-XL) 50 MG 24 hr tablet, TAKE 1 TABLET BY MOUTH EVERY DAY IMMEDIATELY FOLLOWING A MEAL. (Patient taking differently: Take 12.5 mg by mouth daily. TAKE 1 TABLET BY MOUTH EVERY DAY IMMEDIATELY FOLLOWING A MEAL.), Disp: 30 tablet, Rfl: 0 .  montelukast (SINGULAIR) 10 MG tablet, Take 1 tablet (10 mg total) by mouth daily., Disp: 30 tablet,  Rfl: 0 .  Multiple Vitamin (MULTIVITAMIN) tablet, Take 1 tablet by mouth daily., Disp: , Rfl:  .  Naproxen Sodium (ALEVE) 220 MG CAPS, Take 1-2 capsules by mouth as needed., Disp: , Rfl:  .  omeprazole (PRILOSEC) 20 MG capsule, Take 1 capsule (20 mg total) by mouth daily. (Patient taking differently: Take 20 mg by mouth daily. Patient takes 10 mg), Disp: 30 capsule, Rfl: 5 .  pravastatin (PRAVACHOL) 40 MG tablet, Take 40 mg by mouth daily., Disp: , Rfl:  .  predniSONE (DELTASONE) 10 MG tablet, Take 1 tablet (10 mg total) daily with breakfast by mouth., Disp: 60 tablet, Rfl: 2 .  Probiotic Product (PROBIOTIC ADVANCED PO), Take 1 capsule by mouth daily. , Disp: , Rfl:  .  Turmeric 500 MG CAPS, Take 1 capsule by mouth daily., Disp: , Rfl:  .  ezetimibe (ZETIA) 10 MG tablet, Take 1 tablet (10 mg total) by mouth daily., Disp: 30 tablet, Rfl: 11 .  predniSONE (DELTASONE) 5 MG tablet, Take 5 mg by mouth daily with breakfast., Disp: , Rfl:     Review of Systems     Objective:   Physical Exam  Constitutional: She is oriented to person, place, and time. She appears well-developed and well-nourished. No distress.  HENT:  Head: Normocephalic and atraumatic.  Right Ear: External ear normal.  Left Ear: External ear normal.  Mouth/Throat: Oropharynx is clear and moist. No oropharyngeal exudate.  Eyes: Conjunctivae and EOM are normal. Pupils are equal, round, and reactive to light. Right eye exhibits no discharge. Left eye exhibits no discharge. No scleral icterus.  Neck: Normal range of motion. Neck supple. No JVD present. No tracheal deviation present. No thyromegaly present.  Cardiovascular: Normal rate, regular rhythm, normal heart sounds and intact distal pulses. Exam reveals no gallop and no friction rub.  No murmur heard. Pulmonary/Chest: Effort normal. No respiratory distress. She has no wheezes. She has rales. She exhibits no tenderness.  Scattered crackles Rt infra-axillary muscle knot   Abdominal: Soft. Bowel sounds are normal. She exhibits no distension and no mass. There is no tenderness. There is no rebound and no guarding.  Musculoskeletal: Normal range of motion. She exhibits no edema or tenderness.  Lymphadenopathy:    She has no cervical adenopathy.  Neurological: She is alert and oriented to person, place, and time. She has normal reflexes. No cranial nerve deficit. She exhibits normal muscle tone. Coordination normal.  Skin: Skin is warm and dry. No rash noted. She is not diaphoretic. No erythema. No pallor.  Psychiatric: She has a normal mood and affect. Her behavior is normal. Judgment and thought content normal.  Vitals reviewed.  Vitals:   01/11/18 1546  BP: 124/76  Pulse: 79  SpO2: 96%  Weight: 143 lb (64.9 kg)  Height: 5' (1.524 m)    Estimated body mass index is 27.93 kg/m as calculated from the following:   Height as of this encounter: 5' (1.524 m).  Weight as of this encounter: 143 lb (64.9 kg).     Assessment:       ICD-10-CM   1. ILD (interstitial lung disease) ([place]) J84.9   2. Hypersensitivity pneumonitis ([place]) J67.9   3. Gastroesophageal reflux disease, esophagitis presence not specified K21.9   4. Right-sided chest wall pain R07.89   5. Exposure to the flu Z20.828        Plan:     ILD (interstitial lung disease) ([place]) Hypersensitivity pneumonitis ([place])  - very slow decline in ILD over years - return o2 to [facility] - some increase in cough due to weather change - agree will watch without antibiotics or prednisone burrst  - continue delsym as needed and daily prednisone 66m per day - in 3 months do Pre-bd spiro and dlco only. No lung volume or bd response. No post-bd spiro   Gastroesophageal reflux disease, esophagitis presence not specified  - re-refer Dr PHilarie FredricksonGI; address question of eosinohilic esophagitis with hm  Right-sided chest wall pain  - probably muscle  Pull from recent cough  - heat pads - if worse will do  cxr  Exposure to the flu - tamiflu 752mper day x 10 days   Followuo 3 months ILD clinic with Dr RAChase Caller > 50% of this > 25 min visit spent in face to face counseling or coordination of care    Dr. MuBrand MalesM.D., F.[facility].P Pulmonary and Critical Care Medicine Staff Physician, CoIvaleeirector - Interstitial Lung Disease  Program  Pulmonary FiBeresfordt [id number]: [phone] no answer or between  15:00h - 7:00h: call [phone] Telephone: [phone]

## 2018-01-11 NOTE — Progress Notes (Signed)
Spirometry and Dlco done today. 

## 2018-01-12 ENCOUNTER — Telehealth: Payer: Self-pay | Admitting: Internal Medicine

## 2018-01-12 NOTE — Telephone Encounter (Signed)
Pt requesting order for [employer] to d/c and pick up O2 equipment.  I advised that order was placed yesterday at her OV and that this is still being processed, can take a few days to process.  Pt expressed understanding.  Nothing further needed.

## 2018-01-13 ENCOUNTER — Encounter (HOSPITAL_COMMUNITY): Payer: Self-pay | Admitting: *Deleted

## 2018-01-13 ENCOUNTER — Encounter (HOSPITAL_COMMUNITY)
Admission: RE | Admit: 2018-01-13 | Discharge: 2018-01-13 | Disposition: A | Discharge status: Home / Self Care | Payer: Medicare Other | Source: Ambulatory Visit | Attending: Internal Medicine | Admitting: Internal Medicine

## 2018-01-13 DIAGNOSIS — J849 Interstitial pulmonary disease, unspecified: Secondary | ICD-10-CM | POA: Diagnosis not present

## 2018-01-13 NOTE — Progress Notes (Signed)
Daily Session Note  Patient Details  Name: Rhonda [name] MRN: [id number] Date of Birth: [birth year] Referring Provider:     Pulmonary Rehab Walk Test from 09/09/2017 in [place]  Referring Provider  Dr. Chase Caller      Encounter Date: 01/13/2018  Check In: Session Check In - 01/13/18 1330      Check-In   Location  [facility]    Staff Present  Su Hilt, MS, ACSM RCEP, Exercise Physiologist;[name] Ysidro Evert, RN;Portia [place], RN, Maxcine Ham, RN, BSN    Supervising physician immediately available to respond to emergencies  [facility] immediately available    Physician(s)  Dr. Horris Latino    Medication changes reported      No    Fall or balance concerns reported     No    Tobacco Cessation  No Change    Warm-up and Cool-down  Performed as group-led instruction    Resistance Training Performed  Yes    VAD Patient?  No      Pain Assessment   Currently in Pain?  No/denies    Multiple Pain Sites  No       Capillary Blood Glucose: No results found for this or any previous visit (from the past 24 hour(s)).    Social History   Tobacco Use  Smoking Status Never Smoker  Smokeless Tobacco Never Used  Tobacco Comment   pt states she experimented in college    Goals Met:  Exercise tolerated well Strength training completed today  Goals Unmet:  Not Applicable  Comments: Service time is from 1330 to [place]    Dr. Rush Farmer is Medical Director for Pulmonary Rehab at [facility]

## 2018-01-17 ENCOUNTER — Ambulatory Visit
Admission: RE | Admit: 2018-01-17 | Discharge: 2018-01-17 | Disposition: A | Discharge status: Home / Self Care | Payer: Medicare Other | Source: Ambulatory Visit | Attending: Family Medicine | Admitting: Family Medicine

## 2018-01-17 DIAGNOSIS — Z1231 Encounter for screening mammogram for malignant neoplasm of breast: Secondary | ICD-10-CM | POA: Diagnosis not present

## 2018-01-18 ENCOUNTER — Encounter (HOSPITAL_COMMUNITY)
Admission: RE | Admit: 2018-01-18 | Discharge: 2018-01-18 | Disposition: A | Discharge status: Home / Self Care | Payer: Medicare Other | Source: Ambulatory Visit | Attending: Internal Medicine | Admitting: Internal Medicine

## 2018-01-18 ENCOUNTER — Other Ambulatory Visit: Payer: Self-pay | Admitting: Family Medicine

## 2018-01-18 DIAGNOSIS — J849 Interstitial pulmonary disease, unspecified: Secondary | ICD-10-CM | POA: Diagnosis not present

## 2018-01-18 DIAGNOSIS — R928 Other abnormal and inconclusive findings on diagnostic imaging of breast: Secondary | ICD-10-CM

## 2018-01-20 DIAGNOSIS — J8410 Pulmonary fibrosis, unspecified: Secondary | ICD-10-CM | POA: Diagnosis not present

## 2018-01-20 DIAGNOSIS — R7301 Impaired fasting glucose: Secondary | ICD-10-CM | POA: Diagnosis not present

## 2018-01-20 DIAGNOSIS — L570 Actinic keratosis: Secondary | ICD-10-CM | POA: Diagnosis not present

## 2018-01-21 ENCOUNTER — Ambulatory Visit
Admission: RE | Admit: 2018-01-21 | Discharge: 2018-01-21 | Disposition: A | Discharge status: Home / Self Care | Payer: Medicare Other | Source: Ambulatory Visit | Attending: Family Medicine | Admitting: Family Medicine

## 2018-01-21 ENCOUNTER — Encounter: Payer: Self-pay | Admitting: Gastroenterology

## 2018-01-21 ENCOUNTER — Ambulatory Visit (INDEPENDENT_AMBULATORY_CARE_PROVIDER_SITE_OTHER): Payer: Medicare Other | Admitting: Gastroenterology

## 2018-01-21 VITALS — BP 102/50 | HR 81 | Ht 60.0 in | Wt 144.0 lb

## 2018-01-21 DIAGNOSIS — R05 Cough: Secondary | ICD-10-CM

## 2018-01-21 DIAGNOSIS — R928 Other abnormal and inconclusive findings on diagnostic imaging of breast: Secondary | ICD-10-CM

## 2018-01-21 DIAGNOSIS — R921 Mammographic calcification found on diagnostic imaging of breast: Secondary | ICD-10-CM | POA: Diagnosis not present

## 2018-01-21 DIAGNOSIS — K219 Gastro-esophageal reflux disease without esophagitis: Secondary | ICD-10-CM

## 2018-01-21 DIAGNOSIS — R053 Chronic cough: Secondary | ICD-10-CM

## 2018-01-21 NOTE — Patient Instructions (Signed)
You have been scheduled for a 24 Hour PH Probe test at [facility] on 02/02/18 at 8:30. Please arrive 30 minutes prior to your procedure for registration. You will need to go to outpatient registration (1st floor of the hospital) first. Make certain to bring your insurance cards as well as a complete list of medications.  Please remember the following:  1) Do not take any muscle relaxants, xanax (alprazolam) or ativan for 1 day prior to your     test as well as the day of the test.  2) Nothing to eat or drink after 12:00 midnight on the night before your test.  3) Hold all diabetic medications/insulin the morning of the test. You may eat and take             your medications after the test.  4)Stay on Omeprazole.  Do NOT stop.  5) For 7 days prior to your test, do not take: Reglan, Tagamet, Zantac, Axid or Pepcid.  6) You MAY use an antacid such as Rolaids or Tums up to 12 hours prior to your test.  It will take at least 2 weeks to receive the results of this test from your physician. ------------------------------------------ ABOUT 24 HOUR [place] PROBE An esophageal pH test measures and records the pH in your esophagus to determine if you have gastroesophageal reflux disease (GERD). The test can also be done to determine the effectiveness of medications or surgical treatment for GERD. What is esophageal reflux? Esophageal reflux is a condition in which stomach acid refluxes or moves back into the esophagus (the "food pipe" leading from the mouth to the stomach). How does the esophageal pH test work? A thin, small tube with an acid sensing device on the tip is gently passed through your nose, down the esophagus ("food tube"), and positioned about 2 inches above the lower esophageal sphincter. The tube is secured to the side of your face with clear tape. The end of the tube exiting from your nose is attached to a portable recorder that is worn on your belt or over your shoulder. The  recorder has several buttons on it that you will press to mark certain events. A nurse will review the monitoring instructions with you. Once the test has begun, what do I need to know and do? Activity: Follow your usual daily routine. Do not reduce or change your activities during the monitoring period. Doing so can make the monitoring results less useful.  Note: do not take a tub bath or shower; the equipment can't get wet.  Eating: Eat your regular meals at the usual times. If you do not eat during the monitoring period, your stomach will not produce acid as usual, and the test results will not be accurate. Eat at least 2 meals a day. Eat foods that tend to increase your symptoms (without making yourself miserable). Avoid snacking. Do not suck on hard candy or lozenges and do not chew gum during the monitoring period.  Lying down: Remain upright throughout the day. Do not lie down until you go to bed (unless napping or lying down during the day is part of your daily routine).  Medications: Continue to follow your doctor's advice regarding medications to avoid during the monitoring period.  Recording symptoms: Press the appropriate button on your recorder when symptoms occur (as discussed with the nurse).  Recording events: Record the time you start and stop eating and drinking (anything other than plain water). Record the time you lie down (  even if just resting) and when you get back up. The nurse will explain this.  Unusual symptoms or side effects. If you think you may be experiencing any unusual symptoms or side effects, call your doctor.  You will return the next day to have the tube removed. The information on the recorder will be downloaded to a computer and the results will be analyzed.  After completion of the study Resume your normal diet and medications. Lozenges or hard candy may help ease any sore throat caused by the tube.

## 2018-01-21 NOTE — Progress Notes (Addendum)
01/21/2018 Rhonda [name] [id number] [birth year]   HISTORY OF PRESENT ILLNESS:  Patient is known to Dr. Hilarie Fredrickson for visit on 08/23/2017 for discussion of reflux issues.  Please see that note for details.  Anyway, she was scheduled for 24 hour pH monitor in order to see if she has LPR that could be affecting her interstitial lung disease.  She says that she got sick at that time so was not able to have it performed but would like to reschedule it at this time.  Still only on prilosec 10 mg daily.  No overt reflux symptoms.  Says that she has to take prilosec, cannot take omeprazole.   Past Medical History:  Diagnosis Date  . Allergy   . Arthritis    history spinal stenosis. osteoarthritis right hip  . Asthma   . Cataract   . Coronary artery calcification seen on CAT scan 08/19/2017   >300 on CT scan 08/2017  . Depression   . DOE (dyspnea on exertion)    a. 04/2010 Lexi MV EF 71%, no ischemia/infarct;    . GERD (gastroesophageal reflux disease)   . H/O steroid therapy    Steroid use orally over 4 yrs- for Lung Fibrosis  . Heart palpitations 02/28/2015  . Helicobacter pylori ab+   . Hiatal hernia   . High cholesterol   . History of chronic bronchitis    as child  . Hyperlipidemia, mixed 08/19/2017  . Hyperplastic colon polyp 2007  . Inguinal hernia    right  . Insulin resistance    past  . Interstitial lung disease ([place])   . MVP (mitral valve prolapse)    Posterior mitral valve leaflet with mild MR  . Pneumonitis, hypersensitivity ([place])    a. 09/2012 s/p Bx - ? 2/2 bird, mold, oil paint exposure ->on steroids, followed by pulm.  . Pulmonary fibrosis ([facility])    Dr. Chase Caller follows- stable at present  . Pulmonary HTN ([place])    mild PASP 17mmHg on echo 02/2015  . PVC (premature ventricular contraction) 08/19/2017  . Rapid heart rate    Dr. Radford Pax follows- last visit Epic note 9'16   Past Surgical History:  Procedure Laterality Date  . BREAST BIOPSY Right 2009   BIOPSY  .  BREAST BIOPSY Left 2003   Benign   . CATARACT EXTRACTION Left   . CESAREAN SECTION    . FOOT FRACTURE SURGERY  2006 or 2007   right  . HYMENECTOMY    . LUNG BIOPSY  09/28/2012   Procedure: LUNG BIOPSY;  Surgeon: Melrose Nakayama, MD;  Location: [facility];  Service: Thoracic;  Laterality: N/A;  lung biopsies tims three  . SQUAMOUS CELL CARCINOMA EXCISION Left    left arm  . TOTAL HIP ARTHROPLASTY Right 09/10/2015   Procedure: RIGHT TOTAL HIP ARTHROPLASTY ANTERIOR APPROACH;  Surgeon: Paralee Cancel, MD;  Location: [facility];  Service: Orthopedics;  Laterality: Right;  . TUBAL LIGATION    . VIDEO ASSISTED THORACOSCOPY  09/28/2012   Procedure: VIDEO ASSISTED THORACOSCOPY;  Surgeon: Melrose Nakayama, MD;  Location: [facility];  Service: Thoracic;  Laterality: Right;  Marland Kitchen VIDEO BRONCHOSCOPY  11/19/2011   Procedure: VIDEO BRONCHOSCOPY WITH FLUORO;  Surgeon: Brand Males, MD;  Location: [facility];  Service: Endoscopy;;    reports that  has never smoked. she has never used smokeless tobacco. She reports that she drinks about 1.2 oz of alcohol per week. She reports that she does not use drugs. family history includes  Allergic rhinitis in her daughter; Asthma in her maternal grandmother and mother; Bone cancer in her paternal grandfather; Breast cancer in her cousin and paternal aunt; Dementia (age of onset: 29) in her mother; Diabetes in her father; Emphysema in her maternal grandmother; Heart disease in her sister; Hypertension in her sister; Kidney disease in her sister; Lung disease in her maternal grandfather; Lymphoma in her father; Osteoarthritis in her mother; Ovarian cancer in her maternal aunt. Allergies  Allergen Reactions  . Atorvastatin Other (See Comments)    Leg pain  . Betadine [Povidone Iodine] Other (See Comments)    blisters  . Codeine Nausea And Vomiting  . Rosuvastatin Other (See Comments)    Leg pain  . Shellfish Allergy Other (See Comments)    vomiting  . Sulfonamide  Derivatives Other (See Comments)    headaches      Outpatient Encounter Medications as of 01/21/2018  Medication Sig  . acetaminophen (TYLENOL) 325 MG tablet Take 650 mg by mouth every 6 (six) hours as needed. Reported on 02/10/2016  . albuterol (PROAIR HFA) 108 (90 Base) MCG/ACT inhaler Inhale 2 puffs into the lungs every 4 (four) hours as needed for wheezing. Or coughing spells.  You may use 2 Puffs 5-10 minutes before exercise.  . Alpha-D-Galactosidase (BEANO PO) Take 1 tablet by mouth as needed (if eating onion or garlic).   . AMBULATORY NON FORMULARY MEDICATION Cinnsulin cinnamon caplsule 1 by mouth daily  . Ascorbic Acid (VITAMIN C) 1000 MG tablet Take 1,000 mg by mouth daily.  Marland Kitchen BREO ELLIPTA 100-25 MCG/INH AEPB TAKE 1 PUFF BY MOUTH EVERY DAY  . Calcium-Vitamin D-Vitamin K (VIACTIV) 323-557-32 MG-UNT-MCG CHEW Chew 2 tablets by mouth daily.   . chlorpheniramine (CHLOR-TRIMETON) 4 MG tablet Take 4 mg by mouth daily as needed for allergies.   Marland Kitchen ezetimibe (ZETIA) 10 MG tablet Take 10 mg by mouth daily.  . fluticasone (FLONASE) 50 MCG/ACT nasal spray SPRAY 2 SPRAYS INTO EACH NOSTRIL EVERY DAY  . metFORMIN (GLUCOPHAGE-XR) 500 MG 24 hr tablet Take 500 mg by mouth daily.  . metoprolol succinate (TOPROL-XL) 50 MG 24 hr tablet TAKE 1 TABLET BY MOUTH EVERY DAY IMMEDIATELY FOLLOWING A MEAL. (Patient taking differently: Take 12.5 mg by mouth daily. TAKE 1/2 TABLET BY MOUTH EVERY DAY IMMEDIATELY FOLLOWING A MEAL.)  . montelukast (SINGULAIR) 10 MG tablet Take 1 tablet (10 mg total) by mouth daily.  . Multiple Vitamin (MULTIVITAMIN) tablet Take 1 tablet by mouth daily.  . Naproxen Sodium (ALEVE) 220 MG CAPS Take 1-2 capsules by mouth as needed.  Marland Kitchen omeprazole (PRILOSEC) 20 MG capsule Take 1 capsule (20 mg total) by mouth daily. (Patient taking differently: Take 10 mg by mouth daily. Patient takes 10 mg)  . pravastatin (PRAVACHOL) 40 MG tablet Take 40 mg by mouth daily.  . predniSONE (DELTASONE) 10 MG  tablet Take 1 tablet (10 mg total) daily with breakfast by mouth.  . predniSONE (DELTASONE) 5 MG tablet Take 5 mg by mouth daily with breakfast.  . Probiotic Product (PROBIOTIC ADVANCED PO) Take 1 capsule by mouth daily.   . Turmeric 500 MG CAPS Take 1 capsule by mouth daily.  . [DISCONTINUED] ezetimibe (ZETIA) 10 MG tablet Take 1 tablet (10 mg total) by mouth daily.  . [DISCONTINUED] oseltamivir (TAMIFLU) 75 MG capsule Take 1 capsule (75 mg total) by mouth 2 (two) times daily.   No facility-administered encounter medications on file as of 01/21/2018.      REVIEW OF SYSTEMS  : All other  systems reviewed and negative except where noted in the History of Present Illness.   PHYSICAL EXAM: BP (!) 102/50   Pulse 81   Ht 5' (1.524 m)   Wt 144 lb (65.3 kg)   BMI 28.12 kg/m  General: Well developed white female in no acute distress Head: Normocephalic and atraumatic Eyes:  Sclerae anicteric, conjunctiva pink. Ears: Normal auditory acuity Lungs: Clear throughout to auscultation; no increased WOB. Heart: Regular rate and rhythm; no M/R/G. Abdomen: Soft, non-distended.  BS present.  Non-tender. Musculoskeletal: Symmetrical with no gross deformities  Skin: No lesions on visible extremities Extremities: No edema  Neurological: Alert oriented x 4, grossly non-focal Psychological:  Alert and cooperative. Normal mood and affect  ASSESSMENT AND PLAN: 67 year old female with a past medical history of ILD, hypersensitivity pneumonitis, GERD, history of hiatal hernia, IBS, possible mild pulmonary hypertension who was seen in consultation at the request of Dr. Chase Caller to evaluate reflux particularly in relation to her interstitial lung disease.   1. GERD, possible LPR/hx of ILD -- we again discussed how uncontrolled reflux is felt to contribute to interstitial lung disease and perhaps the progression of this disease process. Clinically she is not having significant heartburn and certainly no evidence  of uncontrolled reflux. We also talked about antireflux surgery such as Nissen fundoplication and how this is often done prior to lung transplant. Certainly, I do not feel that she needs Nissen fundoplication at present but it is very reasonable to further evaluate to ensure reflux is maximally medically managed. With this in mind we will proceed to 24-hour pH and impedance testing. We discussed how this would tell us how adequately her reflux is controlled and also if there is symptom correlation between cough and reflux events. She will continue omeprazole 10 mg on a daily basis including during the study period. --She has had prior EGD and I do not think this needs to be repeated at present  **She never proceeded with the pH study but would like to reschedule at this time.  CC:  Hayden Rasmussen, MD   Addendum: Reviewed and agree with management. Pyrtle, Lajuan Lines, MD

## 2018-01-24 ENCOUNTER — Telehealth: Payer: Self-pay | Admitting: Gastroenterology

## 2018-01-24 NOTE — Telephone Encounter (Signed)
Patient states she needs to resch procedure at [facility] with Dr.Nandigam for 3.13.19. Pt requesting cb to resch.

## 2018-01-24 NOTE — Addendum Note (Signed)
Encounter addended by: Jewel Baize, RD on: 01/24/2018 9:25 AM  Actions taken: Flowsheet data copied forward, Visit Navigator Flowsheet section accepted

## 2018-01-25 NOTE — Telephone Encounter (Signed)
The pt has been rescheduled to 02/21/18 at 8:30 am and she was advised and re instructed.

## 2018-02-07 ENCOUNTER — Ambulatory Visit (INDEPENDENT_AMBULATORY_CARE_PROVIDER_SITE_OTHER)
Admission: RE | Admit: 2018-02-07 | Discharge: 2018-02-07 | Disposition: A | Discharge status: Home / Self Care | Payer: Medicare Other | Source: Ambulatory Visit | Attending: Internal Medicine | Admitting: Internal Medicine

## 2018-02-07 ENCOUNTER — Telehealth: Payer: Self-pay | Admitting: Internal Medicine

## 2018-02-07 DIAGNOSIS — J849 Interstitial pulmonary disease, unspecified: Secondary | ICD-10-CM | POA: Diagnosis not present

## 2018-02-07 DIAGNOSIS — R0789 Other chest pain: Secondary | ICD-10-CM | POA: Diagnosis not present

## 2018-02-07 NOTE — Telephone Encounter (Signed)
Patient returned call - she will come in today for cxr- she is wanting to know if she can be seen afterwards or wait for results because she is not feeling well -Pt can be reached at [phone] -pr

## 2018-02-07 NOTE — Telephone Encounter (Signed)
LMTCB

## 2018-02-07 NOTE — Telephone Encounter (Signed)
Rhonda [name] just contacted me with pain in ribs. PReviously had pneumothorax with similar symotms. She only lives 5 min away. Please have her come and do cxr 2 view 02/07/2018   Dr. Brand Males, M.D., F.C.C.P Pulmonary and Critical Care Medicine Staff Physician, [facility] Director - Interstitial Lung Disease  Program  [facility] [place] [facility] [place], [place], [address]  Pager: [phone], If no answer or between  15:00h - 7:00h: call [phone] Telephone: [phone]

## 2018-02-07 NOTE — Telephone Encounter (Signed)
Have ordered the chest xray for pt.  Attempted to call her letting her know she needed to come in to get a cxr but pt did not answer.  Left a detailed message on pt's phone stating all the above regarding needing to come in to get a cxr.  Stated in message to have pt return my call so that way I know she has received the message about coming to get the cxr.  Will leave this message open until pt has returned call.

## 2018-02-07 NOTE — Telephone Encounter (Signed)
Pt came in today for the cxr. I went out to speak to pt when she came upstairs to the lobby after cxr.  An appt has been scheduled for pt to see MR tomorrow for an acute visit at 11:45.  MR will go over results of cxr with pt tomorrow. Nothing further needed at this current time.

## 2018-02-08 ENCOUNTER — Encounter: Payer: Self-pay | Admitting: Internal Medicine

## 2018-02-08 ENCOUNTER — Other Ambulatory Visit (INDEPENDENT_AMBULATORY_CARE_PROVIDER_SITE_OTHER): Payer: Medicare Other

## 2018-02-08 ENCOUNTER — Ambulatory Visit (INDEPENDENT_AMBULATORY_CARE_PROVIDER_SITE_OTHER): Payer: Medicare Other | Admitting: Internal Medicine

## 2018-02-08 VITALS — BP 110/64 | HR 81 | Temp 98.2°F | Ht 60.0 in | Wt 142.4 lb

## 2018-02-08 DIAGNOSIS — M2550 Pain in unspecified joint: Secondary | ICD-10-CM | POA: Diagnosis not present

## 2018-02-08 DIAGNOSIS — R0789 Other chest pain: Secondary | ICD-10-CM | POA: Diagnosis not present

## 2018-02-08 DIAGNOSIS — R5383 Other fatigue: Secondary | ICD-10-CM | POA: Diagnosis not present

## 2018-02-08 DIAGNOSIS — J849 Interstitial pulmonary disease, unspecified: Secondary | ICD-10-CM

## 2018-02-08 DIAGNOSIS — J029 Acute pharyngitis, unspecified: Secondary | ICD-10-CM | POA: Diagnosis not present

## 2018-02-08 DIAGNOSIS — J679 Hypersensitivity pneumonitis due to unspecified organic dust: Secondary | ICD-10-CM

## 2018-02-08 LAB — SEDIMENTATION RATE: SED RATE: 7 mm/h (ref 0–30)

## 2018-02-08 MED ORDER — TRAMADOL HCL 50 MG PO TABS: 50.0000 mg | ORAL_TABLET | Freq: Four times a day (QID) | ORAL | 2 refills | Status: DC | PRN

## 2018-02-08 NOTE — Progress Notes (Signed)
Subjective:     Patient ID: Rhonda [name], female   DOB: 11/12/1951, 67 y.o.   MRN: [id number]  HPI  #GE reflux with small hiatal hernia  - on ppi   #History of rapid heart rate not otherwise specified  - Start her on Lopressor  2008/2009 by primary care physician. History appears to correlate with onset of respiratory issues  - refuses to dc this drug as of 2014 discussion   # Hypersensitivity Pneumonitis and ILD  - Potential etiologies - cockateil x 2 for 18 years through end 2012. 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 Denies mold but lives in house built in [place] with a "weird baselment: and has humidifier  - first noted on CT chest 10/15/09 following trip to [place] (PE negative) but not described in 2003 CT chest report  - autoimmune panel: 10/23/11: Negative  -  Uderwennt bronch 11/19/11  - non diagnostic  - VATS Nov 2013 (done after initially refusing)- [place]. However, there is worrying trend of UIP pattern in the Upper lobes.            Oct/Nov 2012  March 2013 08/11/12  Nov/Dec 2013 Dx HP/UIP 01/02/2013  02/28/13 05/25/2013  12/06/2013  03/15/2014  06/29/2014  10'27/15 12/19/2014  02/26/15 01/13/2016  04/29/2016  10/23/2016  01/25/2017  06/17/2017 [facility] July 2018 01/11/2018   Symp/Signs Dyspnea x5y  New crackles  dimished dyspnea except at hill. Improved crackles.  Improved dyspnea except @ hill               FVC  2.6 L    2.2 L/82%   2.1 L/80%   2.19 L/82%  2.2L/91% 2.1/81% 1.86/69% 1.9L/71% 1.86/75% 1.75L/65% 1.82/73% (dec 2015 offce spiro) 2.84L/72% 1.54/61% 1.78L/67%  1.69L/65%  1.61/62%  DLCO  10/50%    10/51%   9.6/51%     11.9/63% 11.7/62%  11.561%  5.5/31% 10.06/57% 11/04/63%  10.43/55% 8.56/50% 10.57/61%  Walk test 185 ft x 3 laps on RA  rest 92%. Pk exertion 90%. Pk HR 108 Rest 92%. Pk exertion 86% at 2.5 laps. Pk HR 102  pk exertion - 88% Rest 96%. Pk  95%. Pk HR is 75. Done at full dose lopressor Rest 94%. Pk 91%. Rest HR 93 with pk HR 103/min. Done at half dose lopressor REst 96%. Pk pulse ox 92%. Rest HR 65. Pk HR 86 Did not desat. Normal Pk HR  Pulse ox 96%, droped to89% at 2nd lap but bounced up and ended at 91% 3rd lap Did not desat Did not desat, Pk HHR 93  ;llowest pulse ox 90% at 2nd lap but she held her breath, then 92% at 3rd lap 99% -> 91% at end of 3rd lap 94%, Dropped to 87% at 3rd lap, HR peak102/m 97%, dropped to 89% in 3rd lap - asymptomatic. HR 100/min No prob with 22md at [facility] 99% rest - -> dopped to 92% 3rd lap. Pkr HR 107/min  CT chest   unchanged          yes Ct mid dec possibly worse - unsure Ct - similar to April 2016 but worse since 2013   CT 06/24/17 - similar to June 2017   Tests/Bx Bronch 0- non diagnostic   VATS - HP with UIP in Upper lobe                 RX    Rx  pred start 11/23/12 On  pred 23m per day and will reduce to 220mOn Pred 2064mer day On pred 67m6mr day On pred 5mg 52m day Rx pred 5mg d49my She reduced to pred 5mg x 69mt 1 month QOD due to GI concern side effects On pred 5mg qod22mt will increase to 5/d after this visit On pred 5mg dail1m On pred 5mg daily50mncrease pred to 67mg per d22meduce to pred 7.5 due to skin sissues                          OV 06/17/2017  Chief Complaint  Patient presents with  . Follow-up    [name]. Pt denies significant cough, denies CP/tightness, f/c/s.     She is better. Feels she does not need o2. Has been to [facility] for transplant clinic; felt too early. Seems higher dose prednisone helping but then she is also bettter t his time of the year. In May 2018 I was at [facility] and curbsided national thought leaders - feel that we could explore silent active GERD as a possibility of ILD getting wors with time.   OV 01/11/2018  Chief Complaint  Patient presents with  . Follow-up    PFT done today.  Pt states she has been coughing x2 weeks since she  returned from a trip to [place]. [place], pt has had pain in ribs. Pt states she has mild SOB which is stable due to rehab.  Pt states that Dr. Richter is Darron Doom to know if pt could possibly have eosinophilic esophagitis.   Went to [place]. [place] has been coughing x 2 weeks. Associated with this is some rib pain on right side. She feels she does not need o2. She feels she does not need pred/abx at this time. She also exposed to flu wit family   OV 02/08/2018  Chief Complaint  Patient presents with  . Acute Visit    CXR done 02/07/18.  Pt states her throat is sore but not as bad as it was yesterday when in office and does have some left ear pain. Pt states she has some pain on right rib cage and has increased fatigue.     Ms. Crist prSittscutely.  I last saw her one month ago gave her Tamiflu for prophylaxis after flu exposure.  At that point in time she was already reducing her chronic stable prednisone of 10 mg for interstitial lung disease/hypersensitivity pneumonitis.  She had tapered herself slowly 3 or 4 weeks ago to 5 mg.  She says she was doing fairly okay but in the last 2 or 3 weeks she has had increased fatigue.  She says she goes daily swimming and then feels energized but then an hour later starts having fatigue which is more than usual.  Also in the daytime she has random fatigue.  In terms of her respiratory symptoms these are stable and in fact slightly better in terms of shortness of breath and cough but she is having sore throat without any fever and some left-sided otalgia.  In addition she is having right-sided chest pains that are more than usual.  The chest pains have been reported before.  These are specifically at localized spots along the previous incision several years ago for interstitial lung disease.  They are tender as well to touch.  This pain is worse.  In addition she is  also being bothered by arthralgia in her hand and left hip.  The left hip  arthralgias old with a hand arthralgias new.  She is worried about pneumothorax recurrence and so she had a chest x-ray yesterday that shows no pneumothorax.  She has stable interstitial lung disease changes but in my personal visualization these interstitial lung disease changes are worse compared to several years ago.  We do know that she has slowly worsening interstitial lung disease.  In terms of a walking desaturation test this shows stability since last visit.  She has seen GI for acid reflux and has pH probe study coming up  In talking to her she tells me she has been on metformin for over a month or 2.  This is new and is meant for pre-diabetes.  Apparently her hemoglobin A1c is always below 6 but is above 5.5.  Walking desaturation test on 02/08/2018 185 feet x 3 laps on ROOM AIR:  did walk normal pace with forehead probe desaturate. Rest pulse ox was 99%, final pulse ox was 93%. HR response 72/min at rest to 93/min at peak exertion. Patient HENRIETTE HESSER  Did not Desaturate < 88% . Rhonda [name] yes did  Desaturated </= 3% points. Rhonda [name] yes did get tachyardic   Dg Chest 2 View  Result Date: 02/07/2018 CLINICAL DATA:  Chest wall pain for 2 weeks EXAM: CHEST - 2 VIEW COMPARISON:  CT 08/20/2017, radiograph 10/11/2015 FINDINGS: Coarse interstitial pattern consistent with pulmonary fibrosis, similar distribution compared to prior. No focal pulmonary opacity or pleural effusion. Stable cardiomediastinal silhouette with aortic atherosclerosis. No pneumothorax. Degenerative changes of the spine. IMPRESSION: No active cardiopulmonary disease. Similar appearance of diffuse pulmonary fibrosis. Negative for a pneumothorax. Electronically Signed   By: Donavan Foil M.D.   On: 02/07/2018 14:26     Results for EVANNE, MATSUNAGA A (MRN [id number]) as of 02/08/2018 08:45  Ref. Range 01/25/2017 10:16 01/11/2018 15:03  FVC-Pre Latest Units: L 1.69 1.61  FVC-%Pred-Pre Latest Units: % 65 62   Results for Bingman, Britlyn A (MRN [id number]) as of 02/08/2018 08:45  Ref. Range 01/25/2017 10:16 01/11/2018 15:03  DLCO unc Latest Units: ml/min/mmHg 10.43 10.57  DLCO unc % pred Latest Units: % 55 56      has a past medical history of Allergy, Arthritis, Asthma, Cataract, Coronary artery calcification seen on CAT scan (08/19/2017), Depression, DOE (dyspnea on exertion), GERD (gastroesophageal reflux disease), H/O steroid therapy, Heart palpitations (04/30/6719), Helicobacter pylori ab+, Hiatal hernia, High cholesterol, History of chronic bronchitis, Hyperlipidemia, mixed (08/19/2017), Hyperplastic colon polyp (2007), Inguinal hernia, Insulin resistance, Interstitial lung disease ([place]), MVP (mitral valve prolapse), Pneumonitis, hypersensitivity ([place]), Pulmonary fibrosis ([place]), Pulmonary HTN ([place]), PVC (premature ventricular contraction) (08/19/2017), and Rapid heart rate.   reports that  has never smoked. she has never used smokeless tobacco.  Past Surgical History:  Procedure Laterality Date  . BREAST BIOPSY Right 2009   BIOPSY  . BREAST BIOPSY Left 2003   Benign   . CATARACT EXTRACTION Left   . CESAREAN SECTION    . FOOT FRACTURE SURGERY  2006 or 2007   right  . HYMENECTOMY    . LUNG BIOPSY  09/28/2012   Procedure: LUNG BIOPSY;  Surgeon: Melrose Nakayama, MD;  Location: [place];  Service: Thoracic;  Laterality: N/A;  lung biopsies tims three  . SQUAMOUS CELL CARCINOMA EXCISION Left    left arm  . TOTAL HIP ARTHROPLASTY Right 09/10/2015   Procedure: RIGHT TOTAL HIP ARTHROPLASTY  ANTERIOR APPROACH;  Surgeon: Paralee Cancel, MD;  Location: [facility];  Service: Orthopedics;  Laterality: Right;  . TUBAL LIGATION    . VIDEO ASSISTED THORACOSCOPY  09/28/2012   Procedure: VIDEO ASSISTED THORACOSCOPY;  Surgeon: Melrose Nakayama, MD;  Location: [facility];  Service: Thoracic;  Laterality: Right;  Marland Kitchen VIDEO BRONCHOSCOPY  11/19/2011   Procedure: VIDEO BRONCHOSCOPY WITH FLUORO;  Surgeon: Brand Males,  MD;  Location[facility];  Service: Endoscopy;;    Allergies  Allergen Reactions  . Atorvastatin Other (See Comments)    Leg pain  . Betadine [Povidone Iodine] Other (See Comments)    blisters  . Codeine Nausea And Vomiting  . Rosuvastatin Other (See Comments)    Leg pain  . Shellfish Allergy Other (See Comments)    vomiting  . Sulfonamide Derivatives Other (See Comments)    headaches    Immunization History  Administered Date(s) Administered  . Hepatitis A 05/23/2010  . Hepatitis B 06/23/2006  . Influenza Split 08/11/2012  . Influenza Whole 09/08/2011  . Influenza, High Dose Seasonal PF 08/24/2016, 07/24/2017  . Influenza,inj,Quad PF,6+ Mos 09/07/2013  . Influenza-Unspecified 09/26/2014, 07/27/2015  . MMR 05/23/2010  . Pneumococcal Conjugate-13 03/15/2014  . Pneumococcal Polysaccharide-23 01/11/2018  . Td 02/22/2003  . Tdap 10/28/2012  . Zoster 12/04/2010  . Zoster Recombinat (Shingrix) 02/15/2017, 05/17/2017    Family History  Problem Relation Age of Onset  . Emphysema Maternal Grandmother   . Asthma Maternal Grandmother   . Asthma Mother   . Osteoarthritis Mother   . Dementia Mother 19  . Lymphoma Father   . Diabetes Father   . Hypertension Sister   . Heart disease Sister   . Kidney disease Sister   . Lung disease Maternal Grandfather   . Bone cancer Paternal Grandfather   . Allergic rhinitis Daughter   . Breast cancer Paternal Aunt   . Ovarian cancer Maternal Aunt   . Breast cancer Cousin   . Colon cancer Neg Hx   . Angioedema Neg Hx   . Eczema Neg Hx   . Immunodeficiency Neg Hx   . Urticaria Neg Hx      Current Outpatient Medications:  .  albuterol (PROAIR HFA) 108 (90 Base) MCG/ACT inhaler, Inhale 2 puffs into the lungs every 4 (four) hours as needed for wheezing. Or coughing spells.  You may use 2 Puffs 5-10 minutes before exercise., Disp: 1 Inhaler, Rfl: 3 .  Alpha-D-Galactosidase (BEANO PO), Take 1 tablet by mouth as needed (if eating onion  or garlic). , Disp: , Rfl:  .  AMBULATORY NON FORMULARY MEDICATION, Take 2 capsules by mouth daily. Cinnsulin cinnamon caplsule 1 by mouth daily , Disp: , Rfl:  .  Ascorbic Acid (VITAMIN C) 1000 MG tablet, Take 1,000 mg by mouth daily., Disp: , Rfl:  .  BREO ELLIPTA 100-25 MCG/INH AEPB, TAKE 1 PUFF BY MOUTH EVERY DAY, Disp: 180 each, Rfl: 1 .  Calcium-Vitamin D-Vitamin K (VIACTIV) 382-505-39 MG-UNT-MCG CHEW, Chew 2 tablets by mouth daily. , Disp: , Rfl:  .  ezetimibe (ZETIA) 10 MG tablet, Take 10 mg by mouth daily., Disp: , Rfl: 11 .  fluticasone (FLONASE) 50 MCG/ACT nasal spray, SPRAY 2 SPRAYS INTO EACH NOSTRIL EVERY DAY, Disp: 1 g, Rfl: 12 .  metFORMIN (GLUCOPHAGE-XR) 500 MG 24 hr tablet, Take 500 mg by mouth daily., Disp: , Rfl:  .  metoprolol succinate (TOPROL-XL) 50 MG 24 hr tablet, TAKE 1 TABLET BY MOUTH EVERY DAY IMMEDIATELY FOLLOWING A MEAL. (Patient taking  differently: Take 12.5 mg by mouth daily. TAKE 1/2 TABLET BY MOUTH EVERY DAY IMMEDIATELY FOLLOWING A MEAL.), Disp: 30 tablet, Rfl: 0 .  montelukast (SINGULAIR) 10 MG tablet, Take 1 tablet (10 mg total) by mouth daily., Disp: 30 tablet, Rfl: 0 .  Multiple Vitamin (MULTIVITAMIN) tablet, Take 1 tablet by mouth daily., Disp: , Rfl:  .  Naproxen Sodium (ALEVE) 220 MG CAPS, Take 1-2 capsules by mouth as needed., Disp: , Rfl:  .  omeprazole (PRILOSEC) 20 MG capsule, Take 1 capsule (20 mg total) by mouth daily. (Patient taking differently: Take 10 mg by mouth daily. Patient takes 10 mg), Disp: 30 capsule, Rfl: 5 .  pravastatin (PRAVACHOL) 40 MG tablet, Take 40 mg by mouth daily., Disp: , Rfl:  .  predniSONE (DELTASONE) 5 MG tablet, Take 5 mg by mouth daily with breakfast., Disp: , Rfl:  .  Probiotic Product (PROBIOTIC ADVANCED PO), Take 2 capsules by mouth daily. , Disp: , Rfl:  .  Turmeric 500 MG CAPS, Take 1 capsule by mouth daily., Disp: , Rfl:  .  acetaminophen (TYLENOL) 325 MG tablet, Take 650 mg by mouth every 6 (six) hours as needed.  Reported on 02/10/2016, Disp: , Rfl:  .  amoxicillin (AMOXIL) 500 MG capsule, Takes before dental procedures, Disp: , Rfl:  .  chlorpheniramine (CHLOR-TRIMETON) 4 MG tablet, Take 4 mg by mouth daily as needed for allergies. , Disp: , Rfl:    Review of Systems     Objective:   Physical Exam Vitals:   02/08/18 1153  BP: 110/64  Pulse: 81  Temp: 98.2 F (36.8 C)  SpO2: 93%       Assessment:       ICD-10-CM   1. Tenderness of chest wall R07.89   2. Arthralgia, unspecified joint M25.50   3. Other fatigue R53.83   4. Sore throat J02.9   5. ILD (interstitial lung disease) ([place]) J84.9   6. Hypersensitivity pneumonitis (HCC) J67.9        Plan:     Tenderness of chest wall Arthralgia, unspecified joint Other fatigue Sore throat   - Not sure what is going on  - for tenderness chest wall - could be neuropathic - can try tramadol 29m once daily as needed initially  - will send script  - if does not work can try gabapentin - ? New onset autoimmune issues - check ESR, aNA, DSDNA, ssa/ssb, scl70, RF, CCP - ? Metformin effect - I am purely speculating - give this holiday for a week or dw PCP RDarron Doom KMaebelle Munroe MD - monitor sore throat with observation - hold off pred burst for now    ILD (interstitial lung disease) ([place] Hypersensitivity pneumonitis ([place]  - seems stable  - go throuh GERD probe in April 2019 - continue daily prednisone 553mper day    Followup In 81m781montho Pre-bd spiro and dlco only. No lung volume or bd response. No post-bd spiro Return to ILD clinic  > 50% of this > 25 min visit spent in face to face counseling or coordination of care   Dr. MurBrand Males.D., F.CChristus Spohn Hospital [facility] ShorelineP Pulmonary and Critical Care Medicine Staff Physician, [place] Jacksonre[place] - Interstitial Lung Disease  Program  Pulmonary [id number]: [phone] no answer or between  15:00h -  7:00h: call [phone] Telephone: [phone]

## 2018-02-08 NOTE — Patient Instructions (Addendum)
Tenderness of chest wall Arthralgia, unspecified joint Other fatigue Sore throat   - Not sure what is going on  - for tenderness chest wall - could be neuropathic - can try tramadol 29m once daily as needed initially  - will send script  - if does not work can try gabapentin - ? New onset autoimmune issues - check ESR, aNA, DSDNA, ssa/ssb, scl70, RF, CCP - ? Metformin effect - I am purely speculating - give this holiday for a week or dw PCP RDarron Doom KMaebelle Munroe MD - monitor sore throat with observation - hold off pred burst for now    ILD (interstitial lung disease) ([place] Hypersensitivity pneumonitis ([place]  - seems stable  - go throuh GERD probe in April 2019 - continue daily prednisone 561mper day    Followup In 21m45montho Pre-bd spiro and dlco only. No lung volume or bd response. No post-bd spiro Return to ILD clinic

## 2018-02-10 LAB — ANTI-SCLERODERMA ANTIBODY: SCLERODERMA (SCL-70) (ENA) ANTIBODY, IGG: NEGATIVE AI

## 2018-02-10 LAB — ANA: Anti Nuclear Antibody(ANA): NEGATIVE

## 2018-02-10 LAB — ANTI-DNA ANTIBODY, DOUBLE-STRANDED

## 2018-02-10 LAB — CYCLIC CITRUL PEPTIDE ANTIBODY, IGG: Cyclic Citrullin Peptide Ab: <16 UNITS

## 2018-02-10 LAB — RHEUMATOID FACTOR

## 2018-02-10 LAB — SJOGREN'S SYNDROME ANTIBODS(SSA + SSB)
SSA (RO) (ENA) ANTIBODY, IGG: NEGATIVE AI
SSB (La) (ENA) Antibody, IgG: <1 AI

## 2018-02-14 ENCOUNTER — Encounter (HOSPITAL_COMMUNITY): Payer: Self-pay

## 2018-02-14 DIAGNOSIS — J849 Interstitial pulmonary disease, unspecified: Secondary | ICD-10-CM

## 2018-02-14 NOTE — Progress Notes (Signed)
Discharge Progress Report  Patient Details  Name: Rhonda [name] MRN: [id number] Date of Birth: 1951/09/28 Referring Provider:     Pulmonary Rehab Walk Test from 09/09/2017 in [place]  Referring Provider  Dr. Chase Caller       Number of Visits: 22  Reason for Discharge:  Patient reached a stable level of exercise. Patient independent in their exercise. Patient has met program and personal goals.    Smoking History:  Social History   Tobacco Use  Smoking Status Never Smoker  Smokeless Tobacco Never Used  Tobacco Comment   pt states she experimented in college    Diagnosis:  ILD (interstitial lung disease) ([place])  ADL UCSD: Pulmonary Assessment Scores    Row Name 01/13/18 1207 01/18/18 1636       ADL UCSD   ADL Phase  Entry  Exit    SOB Score total  24  -      CAT Score   CAT Score  14 Entry  -      mMRC Score   mMRC Score  -  1       Initial Exercise Prescription:   Discharge Exercise Prescription (Final Exercise Prescription Changes): Exercise Prescription Changes - 01/06/18 1617      Response to Exercise   Blood Pressure (Admit)  104/64    Blood Pressure (Exercise)  120/60    Blood Pressure (Exit)  110/54    Heart Rate (Admit)  78 bpm    Heart Rate (Exercise)  96 bpm    Heart Rate (Exit)  76 bpm    Oxygen Saturation (Admit)  94 %    Oxygen Saturation (Exercise)  91 %    Oxygen Saturation (Exit)  94 %    Rating of Perceived Exertion (Exercise)  15    Perceived Dyspnea (Exercise)  2    Duration  Progress to 45 minutes of aerobic exercise without signs/symptoms of physical distress    Intensity  THRR unchanged      Progression   Progression  Continue to progress workloads to maintain intensity without signs/symptoms of physical distress.      Resistance Training   Training Prescription  Yes    Weight  orange bands    Reps  10-15    Time  10 Minutes      Rower   Level  4    Minutes  17      Track   Laps  17    Minutes  17       Functional Capacity: 6 Minute Walk    Row Name 01/18/18 1635         6 Minute Walk   Phase  Discharge     Distance  1300 feet     Distance Feet Change  100 ft     Walk Time  6 minutes     # of Rest Breaks  0     MPH  2.46     METS  2.91     RPE  12     Perceived Dyspnea   1     Symptoms  No     Resting HR  80 bpm     Resting BP  100/54     Resting Oxygen Saturation   94 %     Exercise Oxygen Saturation  during 6 min walk  88 %     Max Ex. HR  101 bpm     Max Ex. BP  142/62       Interval HR   1 Minute HR  91     2 Minute HR  96     3 Minute HR  89     4 Minute HR  99     5 Minute HR  101     6 Minute HR  92     Interval Heart Rate?  Yes       Interval Oxygen   Interval Oxygen?  Yes     Baseline Oxygen Saturation %  94 %     1 Minute Oxygen Saturation %  94 %     1 Minute Liters of Oxygen  0 L     2 Minute Oxygen Saturation %  90 %     2 Minute Liters of Oxygen  0 L     3 Minute Oxygen Saturation %  88 %     3 Minute Liters of Oxygen  0 L     4 Minute Oxygen Saturation %  88 %     4 Minute Liters of Oxygen  0 L     5 Minute Oxygen Saturation %  88 %     5 Minute Liters of Oxygen  0 L     6 Minute Oxygen Saturation %  88 %     6 Minute Liters of Oxygen  0 L        Psychological, QOL, Others - Outcomes: PHQ 2/9: Depression screen [facility] 2/9 09/03/2017 08/21/2015 03/04/2015 01/22/2015 10/12/2014  Decreased Interest 0 0 0 0 0  Down, Depressed, Hopeless 0 0 0 0 0  PHQ - 2 Score 0 0 0 0 0  Some recent data might be hidden    Quality of Life:   Personal Goals: Goals established at orientation with interventions provided to work toward goal.    Personal Goals Discharge: Goals and Risk Factor Review    Row Name 01/04/18 0905 02/14/18 1149           Core Components/Risk Factors/Patient Goals Review   Personal Goals Review  Weight Management/Obesity;Improve shortness of breath with ADL's  Weight Management/Obesity;Improve  shortness of breath with ADL's      Review  for graduation this week , has worked hard will continue exercising at the [facility].  patient did not meet her weight loss goals however her shortness of breath was significatly improved at discharge.      Expected Outcomes  see admission goals  see admission goals         Exercise Goals and Review:   Nutrition & Weight - Outcomes:  Post Biometrics - 01/18/18 1637       Post  Biometrics   Grip Strength  29 kg       Nutrition:   Nutrition Discharge: Nutrition Assessments - 01/24/18 0923      Rate Your Plate Scores   Pre Score  69    Post Score  64       Education Questionnaire Score: Knowledge Questionnaire Score - 01/13/18 1207      Knowledge Questionnaire Score   Pre Score  13/13

## 2018-02-16 ENCOUNTER — Telehealth: Payer: Self-pay | Admitting: Internal Medicine

## 2018-02-16 NOTE — Telephone Encounter (Signed)
Spoke with pt. She states that she is feeling 99% better now that she is off the Metformin. Pt states that when she was told about her other lab work, she was told that MR would probably want to do further blood work.  MR - please advise. Thanks.

## 2018-02-16 NOTE — Telephone Encounter (Signed)
Spoke with patient. She is aware of lab results. She wanted to let MR know that she has stopped taking the metformin as discussed during her last OV.   She wanted to know if MR wanted to proceed with the labwork with the above information.   MR, please advise. Thanks!

## 2018-02-16 NOTE — Telephone Encounter (Signed)
1. Is she feeling better off metformin?  2. Regarding labs - what labs ar we talking about? Because I ordered these below udring her last OV and thy were normal  Results for MOLLYANN, HALBERT (MRN [id number]) as of 02/16/2018 13:35  Ref. Range 02/08/2018 12:50  Sed Rate Latest Ref Range: 0 - 30 mm/hr 7  Anit Nuclear Antibody(ANA) Latest Ref Range: NEGATIVE  NEGATIVE  Cyclic Citrullin Peptide Ab Latest Units: UNITS <16  ds DNA Ab Latest Units: IU/mL <1  RA Latex Turbid. Latest Ref Range: <14 IU/mL <14  SSA (Ro) (ENA) Antibody, IgG Latest Ref Range: <1.0 NEG AI <1.0 NEG  SSB (La) (ENA) Antibody, IgG Latest Ref Range: <1.0 NEG AI <1.0 NEG  Scleroderma (Scl-70) (ENA) Antibody, IgG Latest Ref Range: <1.0 NEG AI <1.0 NEG     Dr. Brand Males, M.D., [facility].C.P Pulmonary and Critical Care Medicine Staff Physician, [place] Director - Interstitial Lung Disease  Program  Pulmonary [place] at [place], [place], [address]  Pager: [phone], If no answer or between  15:00h - 7:00h: call [phone] Telephone: [phone]

## 2018-02-16 NOTE — Telephone Encounter (Signed)
Attempted to call pt but no answer.   Left message for pt to return our call x1 

## 2018-02-16 NOTE — Telephone Encounter (Signed)
I mean I wanted to check repeat autoimume and we did do that blood work when she came and was normaal. I do not remember if there was something else. If so, I do not remember   Glad better off metformin   Dr. Brand Males, M.D., [facility].C.P Pulmonary and Critical Care Medicine Staff Physician, [place] Director - Interstitial Lung Disease  Program  Pulmonary [place] at [place], [place], [address]  Pager: [phone], If no answer or between  15:00h - 7:00h: call [phone] Telephone: [phone]

## 2018-02-17 ENCOUNTER — Telehealth: Payer: Self-pay | Admitting: *Deleted

## 2018-02-17 DIAGNOSIS — K219 Gastro-esophageal reflux disease without esophagitis: Secondary | ICD-10-CM

## 2018-02-17 NOTE — Telephone Encounter (Signed)
Rhonda [name] with Rhonda [name] endo called stating that patient is scheduled for 24 hour Rhonda [name] on 03/02/18. She states that she was wondering if patient also needed a manometry as well? Patient saw Rhonda [name] on 01/21/18 and 24 hour manometry was ordered but no mention of mano. Please advise.Marland KitchenMarland KitchenMarland Kitchen

## 2018-02-18 NOTE — Telephone Encounter (Signed)
I have spoken to Rhonda [name] at [facility] to advise that she may add manometry to the 24 hour ph probe.  I have also spoken to [facility] our precert coordinator to advise of addition of manometry to ph probe.

## 2018-02-18 NOTE — Telephone Encounter (Signed)
Would do mano also with the 24 hr ph study

## 2018-02-21 ENCOUNTER — Ambulatory Visit (HOSPITAL_COMMUNITY)
Admission: RE | Admit: 2018-02-21 | Discharge: 2018-02-21 | Disposition: A | Discharge status: Home / Self Care | Payer: Medicare Other | Source: Ambulatory Visit | Attending: Gastroenterology | Admitting: Gastroenterology

## 2018-02-21 ENCOUNTER — Encounter (HOSPITAL_COMMUNITY)
Admission: RE | Disposition: A | Discharge status: Home / Self Care | Payer: Self-pay | Source: Ambulatory Visit | Attending: Gastroenterology

## 2018-02-21 ENCOUNTER — Encounter (HOSPITAL_COMMUNITY): Payer: Self-pay | Admitting: *Deleted

## 2018-02-21 DIAGNOSIS — K449 Diaphragmatic hernia without obstruction or gangrene: Secondary | ICD-10-CM | POA: Diagnosis not present

## 2018-02-21 DIAGNOSIS — K219 Gastro-esophageal reflux disease without esophagitis: Secondary | ICD-10-CM | POA: Insufficient documentation

## 2018-02-21 DIAGNOSIS — R059 Cough, unspecified: Secondary | ICD-10-CM

## 2018-02-21 DIAGNOSIS — R05 Cough: Secondary | ICD-10-CM | POA: Insufficient documentation

## 2018-02-21 HISTORY — PX: 24 HOUR PH STUDY: SHX5419

## 2018-02-21 HISTORY — PX: ESOPHAGEAL MANOMETRY: SHX5429

## 2018-02-21 SURGERY — MANOMETRY, ESOPHAGUS

## 2018-02-21 MED ORDER — LIDOCAINE VISCOUS 2 % MT SOLN
OROMUCOSAL | Status: AC
  Filled 2018-02-21: qty 15

## 2018-02-21 SURGICAL SUPPLY — 2 items
FACESHIELD LNG OPTICON STERILE (SAFETY) IMPLANT
GLOVE BIO SURGEON STRL SZ8 (GLOVE) ×6 IMPLANT

## 2018-02-21 NOTE — Progress Notes (Signed)
Esophageal manometry done per protocol.  Patient tolerated well.  PH probe was placed at 35 cm.  Instructions were given on use of digitrapper and on when to return to have probe removed.  Patient verbalized understanding and will return tomorrow at 0935.  Patient remains on omeprazole for test.

## 2018-02-28 ENCOUNTER — Encounter: Payer: Self-pay | Admitting: Internal Medicine

## 2018-03-01 NOTE — Telephone Encounter (Signed)
Dr. Silverio Decamp will be reading this study Once I have the results I will let her know results ASAP

## 2018-03-02 ENCOUNTER — Telehealth: Payer: Self-pay | Admitting: Internal Medicine

## 2018-03-02 NOTE — Telephone Encounter (Signed)
Esophageal manometry and 24-hour pH testing performed, see formal report which will be scanned into medical record  Findings normal esophageal motility Very low DeMeester score indicating good control of acid reflux on PPI Esophageal acid exposure is normal both in upright and supine position Overall reflux episodes within normal limits of which are predominantly weekly acid reflux Positive symptom correlation for cough with reflux events  I discussed of the positive symptom correlation with her.  It is difficult to know whether weakly acidic reflux events trigger her cough or rather cough triggers the weekly acidic reflux events.  My suspicion is that her cough is precipitating these weakly acidic reflux episodes.  If this is the case then Nissen fundoplication would not help her cough as cough is primary instigator for reflux. No role for increasing PPI given controlled reflux on omeprazole 20 mg daily  He will discussed these findings with Dr. Chase Caller when she sees him in May 2019 For now, continue omeprazole 20 mg daily

## 2018-03-17 ENCOUNTER — Telehealth: Payer: Self-pay | Admitting: Internal Medicine

## 2018-03-17 NOTE — Telephone Encounter (Signed)
Hi Veena  Glad M score was way < 14 on pH probe but noted you felt cough was related to gerd. Correct? Any advise for that  Thanks  MR

## 2018-03-18 ENCOUNTER — Telehealth: Payer: Self-pay | Admitting: Internal Medicine

## 2018-03-18 MED ORDER — DOXYCYCLINE HYCLATE 100 MG PO TABS: 100.0000 mg | ORAL_TABLET | Freq: Two times a day (BID) | ORAL | 0 refills | Status: DC

## 2018-03-18 MED ORDER — PREDNISONE 10 MG PO TABS: ORAL_TABLET | ORAL | 0 refills | Status: DC

## 2018-03-18 NOTE — Telephone Encounter (Signed)
Its hard to differentiate if GERD is triggering cough or cough with increased intrathoracic pressure making reflux worse but in her case its likely cough is triggering reflux episodes given she has overall very few reflux events which were within normal limits with good acid suppression .

## 2018-03-18 NOTE — Telephone Encounter (Signed)
Spoke with Rhonda [name] on the phone- Rhonda [name] recommends a pred taper 40mg  qd X2 days, 30mg  qd X2 days, 20mg  qd X2 days, 10mg  qd X2 days, then resume baseline 5mg  daily, also call in Doxycycline 100mg  bid X7 days   Spoke with Rhonda [name], aware of recs.  rx's sent to preferred pharmacy.  Nothing further needed.

## 2018-03-18 NOTE — Telephone Encounter (Signed)
Pt c/o sinus congestion, pnd, headaches, intermittent low grade temp, fatigue, prod cough with green to clear mucus X3 days.    Taking chlortrimeton to help with s/s, requesting additional recs.  Pt is scheduled to see TP on Tuesday, but would like recs over the weekend.    Pt uses [facility].    MR please advise on recs.  Thanks.

## 2018-03-18 NOTE — Telephone Encounter (Signed)
Got it. THanks. Encouner closed. No need for reply

## 2018-03-22 ENCOUNTER — Ambulatory Visit (INDEPENDENT_AMBULATORY_CARE_PROVIDER_SITE_OTHER): Payer: Medicare Other | Admitting: Adult Health

## 2018-03-22 ENCOUNTER — Ambulatory Visit (INDEPENDENT_AMBULATORY_CARE_PROVIDER_SITE_OTHER)
Admission: RE | Admit: 2018-03-22 | Discharge: 2018-03-22 | Disposition: A | Discharge status: Home / Self Care | Payer: Medicare Other | Source: Ambulatory Visit | Attending: Adult Health | Admitting: Adult Health

## 2018-03-22 ENCOUNTER — Encounter: Payer: Self-pay | Admitting: Adult Health

## 2018-03-22 VITALS — BP 130/66 | HR 70 | Ht 59.5 in | Wt 141.4 lb

## 2018-03-22 DIAGNOSIS — J849 Interstitial pulmonary disease, unspecified: Secondary | ICD-10-CM | POA: Diagnosis not present

## 2018-03-22 DIAGNOSIS — J209 Acute bronchitis, unspecified: Secondary | ICD-10-CM | POA: Diagnosis not present

## 2018-03-22 DIAGNOSIS — R05 Cough: Secondary | ICD-10-CM | POA: Diagnosis not present

## 2018-03-22 MED ORDER — DOXYCYCLINE HYCLATE 100 MG PO TABS: 100.0000 mg | ORAL_TABLET | Freq: Two times a day (BID) | ORAL | 0 refills | Status: DC

## 2018-03-22 NOTE — Progress Notes (Signed)
_0  ID: Rhonda [name], female    DOB: 1951-05-14, 67 y.o.   MRN: [id number]  Chief Complaint  Patient presents with  . Acute Visit    Referring provider: Hayden Rasmussen, MD  HPI: 67 year old female followed for ILD, hypersensitivity pneumonitis  03/22/2018 Acute OV : Cough  Patient presents for an acute office visit.  Complains of 2 weeks of increased cough congestion with thick mucus that was initially yellow to green.  Patient's husband had similar symptoms. Planes of increased shortness of breath and tightness.  She was called and doxycycline and a prednisone taper on April 26 and is currently on day 4 of 7.  Patient has had some improvement but continues to have increased cough and congestion.  She denies any nausea vomiting or diarrhea. Appetite is okay.   Allergies  Allergen Reactions  . Atorvastatin Other (See Comments)    Leg pain  . Betadine [Povidone Iodine] Other (See Comments)    blisters  . Codeine Nausea And Vomiting  . Rosuvastatin Other (See Comments)    Leg pain  . Shellfish Allergy Other (See Comments)    vomiting  . Sulfonamide Derivatives Other (See Comments)    headaches    Immunization History  Administered Date(s) Administered  . Hepatitis A 05/23/2010  . Hepatitis B 06/23/2006  . Influenza Split 08/11/2012  . Influenza Whole 09/08/2011  . Influenza, High Dose Seasonal PF 08/24/2016, 07/24/2017  . Influenza,inj,Quad PF,6+ Mos 09/07/2013  . Influenza-Unspecified 09/26/2014, 07/27/2015  . MMR 05/23/2010  . Pneumococcal Conjugate-13 03/15/2014  . Pneumococcal Polysaccharide-23 01/11/2018  . Td 02/22/2003  . Tdap 10/28/2012  . Zoster 12/04/2010  . Zoster Recombinat (Shingrix) 02/15/2017, 05/17/2017    Past Medical History:  Diagnosis Date  . Allergy   . Arthritis    history spinal stenosis. osteoarthritis right hip  . Asthma   . Cataract   . Coronary artery calcification seen on CAT scan 08/19/2017   >300 on CT scan 08/2017    . Depression   . DOE (dyspnea on exertion)    a. 04/2010 Lexi MV EF 71%, no ischemia/infarct;    . GERD (gastroesophageal reflux disease)   . H/O steroid therapy    Steroid use orally over 4 yrs- for Lung Fibrosis  . Heart palpitations 02/28/2015  . Helicobacter pylori ab+   . Hiatal hernia   . High cholesterol   . History of chronic bronchitis    as child  . Hyperlipidemia, mixed 08/19/2017  . Hyperplastic colon polyp 2007  . Inguinal hernia    right  . Insulin resistance    past  . Interstitial lung disease ([place])   . MVP (mitral valve prolapse)    Posterior mitral valve leaflet with mild MR  . Pneumonitis, hypersensitivity ([place])    a. 09/2012 s/p Bx - ? 2/2 bird, mold, oil paint exposure ->on steroids, followed by pulm.  . Pulmonary fibrosis ([facility])    Dr. Chase Caller follows- stable at present  . Pulmonary HTN ([place])    mild PASP 62mHg on echo 02/2015  . PVC (premature ventricular contraction) 08/19/2017  . Rapid heart rate    Dr. TRadford Paxfollows- last visit Epic note 9'16    Tobacco History: Social History   Tobacco Use  Smoking Status Never Smoker  Smokeless Tobacco Never Used  Tobacco Comment   pt states she experimented in college   Counseling given: Not Answered Comment: pt states she experimented in college   Outpatient Encounter Medications as of 03/22/2018  Medication Sig  . acetaminophen (TYLENOL) 325 MG tablet Take 650 mg by mouth every 6 (six) hours as needed. Reported on 02/10/2016  . albuterol (PROAIR HFA) 108 (90 Base) MCG/ACT inhaler Inhale 2 puffs into the lungs every 4 (four) hours as needed for wheezing. Or coughing spells.  You may use 2 Puffs 5-10 minutes before exercise.  . Alpha-D-Galactosidase (BEANO PO) Take 1 tablet by mouth as needed (if eating onion or garlic).   . AMBULATORY NON FORMULARY MEDICATION Take 2 capsules by mouth daily. Cinnsulin cinnamon caplsule 1 by mouth daily   . amoxicillin (AMOXIL) 500 MG capsule Takes before dental procedures   . Ascorbic Acid (VITAMIN C) 1000 MG tablet Take 1,000 mg by mouth daily.  Marland Kitchen BREO ELLIPTA 100-25 MCG/INH AEPB TAKE 1 PUFF BY MOUTH EVERY DAY  . Calcium-Vitamin D-Vitamin K (VIACTIV) 950-932-67 MG-UNT-MCG CHEW Chew 2 tablets by mouth daily.   . chlorpheniramine (CHLOR-TRIMETON) 4 MG tablet Take 4 mg by mouth daily as needed for allergies.   Marland Kitchen doxycycline (VIBRA-TABS) 100 MG tablet Take 1 tablet (100 mg total) by mouth 2 (two) times daily.  Marland Kitchen ezetimibe (ZETIA) 10 MG tablet Take 10 mg by mouth daily.  . fluticasone (FLONASE) 50 MCG/ACT nasal spray SPRAY 2 SPRAYS INTO EACH NOSTRIL EVERY DAY  . metoprolol succinate (TOPROL-XL) 50 MG 24 hr tablet TAKE 1 TABLET BY MOUTH EVERY DAY IMMEDIATELY FOLLOWING A MEAL. (Patient taking differently: Take 12.5 mg by mouth daily. TAKE 1/2 TABLET BY MOUTH EVERY DAY IMMEDIATELY FOLLOWING A MEAL.)  . montelukast (SINGULAIR) 10 MG tablet Take 1 tablet (10 mg total) by mouth daily.  . Multiple Vitamin (MULTIVITAMIN) tablet Take 1 tablet by mouth daily.  . Naproxen Sodium (ALEVE) 220 MG CAPS Take 1-2 capsules by mouth as needed.  Marland Kitchen omeprazole (PRILOSEC) 20 MG capsule Take 1 capsule (20 mg total) by mouth daily. (Patient taking differently: Take 10 mg by mouth daily. Patient takes 10 mg)  . pravastatin (PRAVACHOL) 40 MG tablet Take 40 mg by mouth daily.  . predniSONE (DELTASONE) 10 MG tablet 40mX2 days, 322mX2 days, 2082m2 days, 20m1mdays, then stop.  . predniSONE (DELTASONE) 5 MG tablet Take 5 mg by mouth daily with breakfast.  . Probiotic Product (PROBIOTIC ADVANCED PO) Take 2 capsules by mouth daily.   . traMADol (ULTRAM) 50 MG tablet Take 1 tablet (50 mg total) by mouth every 6 (six) hours as needed.  . Turmeric 500 MG CAPS Take 1 capsule by mouth daily.  . metFORMIN (GLUCOPHAGE-XR) 500 MG 24 hr tablet Take 500 mg by mouth daily.   No facility-administered encounter medications on file as of 03/22/2018.      Review of Systems  Constitutional:   No  weight  loss, night sweats,  Fevers, chills, fatigue, or  lassitude.  HEENT:   No headaches,  Difficulty swallowing,  Tooth/dental problems, or  Sore throat,                No sneezing, itching, ear ache,  +nasal congestion, post nasal drip,   CV:  No chest pain,  Orthopnea, PND, swelling in lower extremities, anasarca, dizziness, palpitations, syncope.   GI  No heartburn, indigestion, abdominal pain, nausea, vomiting, diarrhea, change in bowel habits, loss of appetite, bloody stools.   Resp:   Skin: no rash or lesions.  GU: no dysuria, change in color of urine, no urgency or frequency.  No flank pain, no hematuria   MS:  No joint pain or swelling.  No decreased range of motion.  No back pain.    Physical Exam  BP 130/66 (BP Location: Left Arm, Cuff Size: Normal)   Pulse 70   Ht 4' 11.5" (1.511 m)   Wt 141 lb 6.4 oz (64.1 kg)   SpO2 95%   BMI 28.08 kg/m   GEN: A/Ox3; pleasant , NAD, thin and elderly    HEENT:  [place]/AT,  EACs-clear, TMs-wnl, NOSE-clear drainage, THROAT-clear, no lesions, no postnasal drip or exudate noted.   NECK:  Supple w/ fair ROM; no JVD; normal carotid impulses w/o bruits; no thyromegaly or nodules palpated; no lymphadenopathy.    RESP  Clear  P & A; w/o, wheezes/ rales/ or rhonchi. no accessory muscle use, no dullness to percussion  CARD:  RRR, no m/r/g, no peripheral edema, pulses intact, no cyanosis or clubbing.  GI:   Soft & nt; nml bowel sounds; no organomegaly or masses detected.   Musco: Warm bil, no deformities or joint swelling noted.   Neuro: alert, no focal deficits noted.    Skin: Warm, no lesions or rashes    Lab Results:  CBC    BNP No results found for: BNP  ProBNP No results found for: PROBNP  Imaging: No results found.   Assessment & Plan:   Acute bronchitis Slow to resolve flare   Plan  Patient Instructions  Extend doxycycline for additional 3 days Finished prednisone as directed Add Mucinex DM twice daily for  cough and congestion  Push fluids  Chest x-ray today Continue on BREO daily.  Follow up with Dr. Chase Caller in 6-8 weeks and As needed   Please contact office for sooner follow up if symptoms do not improve or worsen or seek emergency care       ILD (interstitial lung disease) ? Flare with Bronchitis  Check cxr   Plan  Patient Instructions  Extend doxycycline for additional 3 days Finished prednisone as directed Add Mucinex DM twice daily for cough and congestion  Push fluids  Chest x-ray today Continue on BREO daily.  Follow up with Dr. Chase Caller in 6-8 weeks and As needed   Please contact office for sooner follow up if symptoms do not improve or worsen or seek emergency care          Rexene Edison, NP 03/22/2018

## 2018-03-22 NOTE — Addendum Note (Signed)
Addended by: Melvenia Needles on: 03/22/2018 05:37 PM   Modules accepted: Orders

## 2018-03-22 NOTE — Assessment & Plan Note (Signed)
Slow to resolve flare   Plan  Patient Instructions  Extend doxycycline for additional 3 days Finished prednisone as directed Add Mucinex DM twice daily for cough and congestion  Push fluids  Chest x-ray today Continue on BREO daily.  Follow up with Dr. Chase Caller in 6-8 weeks and As needed   Please contact office for sooner follow up if symptoms do not improve or worsen or seek emergency care

## 2018-03-22 NOTE — Assessment & Plan Note (Signed)
?   Flare with Bronchitis  Check cxr   Plan  Patient Instructions  Extend doxycycline for additional 3 days Finished prednisone as directed Add Mucinex DM twice daily for cough and congestion  Push fluids  Chest x-ray today Continue on BREO daily.  Follow up with Dr. Chase Caller in 6-8 weeks and As needed   Please contact office for sooner follow up if symptoms do not improve or worsen or seek emergency care

## 2018-03-22 NOTE — Patient Instructions (Addendum)
Extend doxycycline for additional 3 days Finished prednisone as directed Add Mucinex DM twice daily for cough and congestion  Push fluids  Chest x-ray today Continue on BREO daily.  Follow up with Dr. Chase Caller in 6-8 weeks and As needed   Please contact office for sooner follow up if symptoms do not improve or worsen or seek emergency care

## 2018-03-28 ENCOUNTER — Other Ambulatory Visit: Payer: Self-pay | Admitting: Internal Medicine

## 2018-04-13 ENCOUNTER — Ambulatory Visit (INDEPENDENT_AMBULATORY_CARE_PROVIDER_SITE_OTHER): Payer: Medicare Other | Admitting: Internal Medicine

## 2018-04-13 ENCOUNTER — Encounter: Payer: Self-pay | Admitting: Internal Medicine

## 2018-04-13 VITALS — BP 118/76 | HR 80 | Ht 59.0 in | Wt 144.2 lb

## 2018-04-13 DIAGNOSIS — M2550 Pain in unspecified joint: Secondary | ICD-10-CM

## 2018-04-13 DIAGNOSIS — J387 Other diseases of larynx: Secondary | ICD-10-CM

## 2018-04-13 DIAGNOSIS — J849 Interstitial pulmonary disease, unspecified: Secondary | ICD-10-CM

## 2018-04-13 DIAGNOSIS — R0789 Other chest pain: Secondary | ICD-10-CM

## 2018-04-13 DIAGNOSIS — J679 Hypersensitivity pneumonitis due to unspecified organic dust: Secondary | ICD-10-CM

## 2018-04-13 LAB — PULMONARY FUNCTION TEST
DL/VA % pred: 104 %
DL/VA: 4.28 ml/min/mmHg/L
DLCO unc % pred: 57 %
DLCO unc: 10.16 ml/min/mmHg
FEF 25-75 PRE: 2.36 L/s
FEF2575-%PRED-PRE: 141 %
FEV1-%PRED-PRE: 68 %
FEV1-PRE: 1.26 L
FEV1FVC-%PRED-PRE: 122 %
FEV6-%Pred-Pre: 58 %
FEV6-PRE: 1.35 L
FEV6FVC-%Pred-Pre: 105 %
FVC-%Pred-Pre: 55 %
FVC-PRE: 1.35 L
PRE FEV1/FVC RATIO: 93 %
Pre FEV6/FVC Ratio: 100 %

## 2018-04-13 MED ORDER — PREDNISONE 10 MG PO TABS: ORAL_TABLET | ORAL | 0 refills | Status: DC

## 2018-04-13 NOTE — Progress Notes (Signed)
Subjective:     Patient ID: Rhonda [name], female   DOB: 06/23/1951, 67 y.o.   MRN: [id number]  HPI   #GE reflux with small hiatal hernia  - on ppi   #History of rapid heart rate not otherwise specified  - Start her on Lopressor  2008/2009 by primary care physician. History appears to correlate with onset of respiratory issues  - refuses to dc this drug as of 2014 discussion   # Hypersensitivity Pneumonitis and ILD  - Potential etiologies - cockateil x 2 for 18 years through end 2012. 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 Denies mold but lives in house built in [place] with a "weird baselment: and has humidifier  - first noted on CT chest 10/15/09 following trip to [place] (PE negative) but not described in 2003 CT chest report  - autoimmune panel: 10/23/11: Negative  -  Uderwennt bronch 11/19/11  - non diagnostic  - VATS Nov 2013 (done after initially refusing)- [place]. However, there is worrying trend of UIP pattern in the Upper lobes.            Oct/Nov 2012  March 2013 08/11/12  Nov/Dec 2013 Dx HP/UIP 01/02/2013  02/28/13 05/25/2013  12/06/2013  03/15/2014  06/29/2014  10'27/15 12/19/2014  02/26/15 01/13/2016  04/29/2016  10/23/2016  01/25/2017  06/17/2017 [facility] July 2018 01/11/2018  04/13/2018   Symp/Signs Dyspnea x5y  New crackles  dimished dyspnea except at hill. Improved crackles.  Improved dyspnea except @ hill                FVC  2.6 L    2.2 L/82%   2.1 L/80%   2.19 L/82%  2.2L/91% 2.1/81% 1.86/69% 1.9L/71% 1.86/75% 1.75L/65% 1.82/73% (dec 2015 offce spiro) 2.84L/72% 1.54/61% 1.78L/67%  1.69L/65%  1.61/62% 1.35/55%  DLCO  10/50%    10/51%   9.6/51%     11.9/63% 11.7/62%  11.561%  5.5/31% 10.06/57% 11/04/63%  10.43/55% 8.56/50% 10.57/61% 10.16/57%  Walk test 185 ft x 3 laps on RA  rest 92%. Pk exertion 90%. Pk HR 108 Rest 92%. Pk exertion 86% at 2.5 laps. Pk HR 102   pk exertion - 88% Rest 96%. Pk 95%. Pk HR is 75. Done at full dose lopressor Rest 94%. Pk 91%. Rest HR 93 with pk HR 103/min. Done at half dose lopressor REst 96%. Pk pulse ox 92%. Rest HR 65. Pk HR 86 Did not desat. Normal Pk HR  Pulse ox 96%, droped to89% at 2nd lap but bounced up and ended at 91% 3rd lap Did not desat Did not desat, Pk HHR 93  ;llowest pulse ox 90% at 2nd lap but she held her breath, then 92% at 3rd lap 99% -> 91% at end of 3rd lap 94%, Dropped to 87% at 3rd lap, HR peak102/m 97%, dropped to 89% in 3rd lap - asymptomatic. HR 100/min No prob with [facility]mwd at [facility] 99% rest - -> dopped to 92% 3rd lap. Pkr HR 107/min 98% rest -> 91% 3rd lap with Pk HR 91/min  CT chest   unchanged          yes Ct mid dec possibly worse - unsure Ct - similar to April 2016 but worse since 2013   CT 06/24/17 - similar to June 2017    Tests/Bx Bronch 0- non diagnostic   VATS - HP with UIP in Upper lobe  RX    Rx pred start 11/23/12 On  pred 30mg  per day and will reduce to 20mg  On Pred 20mg  per day On pred 10mg  per day On pred 5mg  per day Rx pred 5mg  daily She reduced to pred 5mg  x past 1 month QOD due to GI concern side effects On pred 5mg  qod but will increase to 5/d after this visit On pred 5mg  daily   On pred 5mg  daily  Increase pred to 10mg  per day Reduce to pred 7.5 due to skin sissues  pred burs and reduce to 10mg  per day                          OV 06/17/2017  Chief Complaint  Patient presents with  . Follow-up    [name]. Pt denies significant cough, denies CP/tightness, f/c/s.     She is better. Feels she does not need o2. Has been to [facility] for transplant clinic; felt too early. Seems higher dose prednisone helping but then she is also bettter t his time of the year. In May 2018 I was at [facility] and curbsided national thought leaders - feel that we could explore silent active GERD as a possibility of ILD getting wors with time.   OV 01/11/2018  Chief Complaint   Patient presents with  . Follow-up    PFT done today.  Pt states she has been coughing x2 weeks since she returned from a trip to [place]. From coughing, pt has had pain in ribs. Pt states she has mild SOB which is stable due to rehab.  Pt states that Dr. Darron Doom is wanting to know if pt could possibly have eosinophilic esophagitis.   Went to [place]. Returned and has been coughing x 2 weeks. Associated with this is some rib pain on right side. She feels she does not need o2. She feels she does not need pred/abx at this time. She also exposed to flu wit family   OV 02/08/2018  Chief Complaint  Patient presents with  . Acute Visit    CXR done 02/07/18.  Pt states her throat is sore but not as bad as it was yesterday when in office and does have some left ear pain. Pt states she has some pain on right rib cage and has increased fatigue.     Ms. Rhonda [name] presents acutely.  I last saw her one month ago gave her Tamiflu for prophylaxis after flu exposure.  At that point in time she was already reducing her chronic stable prednisone of 10 mg for interstitial lung disease/hypersensitivity pneumonitis.  She had tapered herself slowly 3 or 4 weeks ago to 5 mg.  She says she was doing fairly okay but in the last 2 or 3 weeks she has had increased fatigue.  She says she goes daily swimming and then feels energized but then an hour later starts having fatigue which is more than usual.  Also in the daytime she has random fatigue.  In terms of her respiratory symptoms these are stable and in fact slightly better in terms of shortness of breath and cough but she is having sore throat without any fever and some left-sided otalgia.  In addition she is having right-sided chest pains that are more than usual.  The chest pains have been reported before.  These are specifically at localized spots along the previous incision several years ago for interstitial lung disease.  They are tender  as well to touch.  This pain is  worse.  In addition she is also being bothered by arthralgia in her hand and left hip.  The left hip arthralgias old with a hand arthralgias new.  She is worried about pneumothorax recurrence and so she had a chest x-ray yesterday that shows no pneumothorax.  She has stable interstitial lung disease changes but in my personal visualization these interstitial lung disease changes are worse compared to several years ago.  We do know that she has slowly worsening interstitial lung disease.  In terms of a walking desaturation test this shows stability since last visit.  She has seen GI for acid reflux and has pH probe study coming up  In talking to her she tells me she has been on metformin for over a month or 2.  This is new and is meant for pre-diabetes.  Apparently her hemoglobin A1c is always below 6 but is above 5.5.  Walking desaturation test on 02/08/2018 185 feet x 3 laps on ROOM AIR:  did walk normal pace with forehead probe desaturate. Rest pulse ox was 99%, final pulse ox was 93%. HR response 72/min at rest to 93/min at peak exertion. [name]  Did not Desaturate < 88% . Rhonda [name] yes did  Desaturated </= 3% points. Rhonda [name] yes did get tachyardic   Dg Chest 2 View  Result Date: 02/07/2018 CLINICAL DATA:  Chest wall pain for 2 weeks EXAM: CHEST - 2 VIEW COMPARISON:  CT 08/20/2017, radiograph 10/11/2015 FINDINGS: Coarse interstitial pattern consistent with pulmonary fibrosis, similar distribution compared to prior. No focal pulmonary opacity or pleural effusion. Stable cardiomediastinal silhouette with aortic atherosclerosis. No pneumothorax. Degenerative changes of the spine. IMPRESSION: No active cardiopulmonary disease. Similar appearance of diffuse pulmonary fibrosis. Negative for a pneumothorax. Electronically Signed   By: Donavan Foil M.D.   On: 02/07/2018 14:26    OV 04/13/2018  Chief Complaint  Patient presents with  . Follow-up    Pt had pre spiro and  DLCO PFT prior to OV. Pt has increase of productive cough-thick white in last week. Pt has some wheezing, and allergy issues.   Ms. Freda Munro presents for routine follow-up.  I saw her acutely just approximately 2 months ago.  Then end of April 2019 she saw a nurse practitioner again acutely and was given antibiotic and prednisone.  She says she felt better after that but in the last week or so due to increased pollen load in the community and also moving furniture because her daughter is relocating out of her home she started having more cough.  There is no change in her dyspnea with the cough is really bad.  This is despite Singulair and Chlor-Trimeton.  Those things to help but just take the edge off.  She feels that the pollen making the cough worse.  There is no change in dyspnea.  Walking desaturation test documented below his baseline.  She had pulmonary function test today but the [facility] shows significant decline compared to February 2019 and she feels surprised by this.  DLCO is baseline unchanged and a walking desaturation test limited in the office is also unchanged.  She does not have any fever but has significant postnasal drip.  She has not followed up at [facility] clinic or the allergist recently.    Results for BOSTON, CATARINO (MRN [id number]) as of 04/13/2018 09:47  Ref. Range 01/11/2018 15:03 04/13/2018 08:47  FVC-Pre Latest Units: L  1.61 1.35  FVC-%Pred-Pre Latest Units: % 62 55   Results for Cappello, Analiz A (MRN [id number]) as of 04/13/2018 09:47  Ref. Range 01/11/2018 15:03 04/13/2018 08:47  DLCO unc Latest Units: ml/min/mmHg 10.57 10.16  DLCO unc % pred Latest Units: % 56 57    Simple office walk 185 feet x  3 laps goal with forehead probe 04/13/2018   O2 used Room air  Number laps completed 3  Comments about pace good  Resting Pulse Ox/HR 98% and 73/min  Final Pulse Ox/HR 91% and 91/min  Desaturated </= 88% no  Desaturated <= 3% points yes  Got Tachycardic  >/= 90/min yes  Symptoms at end of test none  Miscellaneous comments x      Review of Systems     Objective:   Physical Exam  Constitutional: She is oriented to person, place, and time. She appears well-developed and well-nourished. No distress.  HENT:  Head: Normocephalic and atraumatic.  Right Ear: External ear normal.  Left Ear: External ear normal.  Mouth/Throat: Oropharynx is clear and moist. No oropharyngeal exudate.  Eyes: Pupils are equal, round, and reactive to light. Conjunctivae and EOM are normal. Right eye exhibits no discharge. Left eye exhibits no discharge. No scleral icterus.  Neck: Normal range of motion. Neck supple. No JVD present. No tracheal deviation present. No thyromegaly present.  Cardiovascular: Normal rate, regular rhythm, normal heart sounds and intact distal pulses. Exam reveals no gallop and no friction rub.  No murmur heard. Pulmonary/Chest: Effort normal and breath sounds normal. No respiratory distress. She has no wheezes. She has no rales. She exhibits no tenderness.  Scattered basekube crackles only  Abdominal: Soft. Bowel sounds are normal. She exhibits no distension and no mass. There is no tenderness. There is no rebound and no guarding.  Musculoskeletal: Normal range of motion. She exhibits no edema or tenderness.  Lymphadenopathy:    She has no cervical adenopathy.  Neurological: She is alert and oriented to person, place, and time. She has normal reflexes. No cranial nerve deficit. She exhibits normal muscle tone. Coordination normal.  Skin: Skin is warm and dry. No rash noted. She is not diaphoretic. No erythema. No pallor.  Psychiatric: She has a normal mood and affect. Her behavior is normal. Judgment and thought content normal.  Vitals reviewed.  Vitals:   04/13/18 0919  BP: 118/76  Pulse: 80  SpO2: 99%  Weight: 144 lb 3.2 oz (65.4 kg)  Height: 4\' 11"  (1.499 m)   Estimated body mass index is 29.12 kg/m as calculated from the  following:   Height as of this encounter: 4\' 11"  (1.499 m).   Weight as of this encounter: 144 lb 3.2 oz (65.4 kg).        Assessment:       ICD-10-CM   1. ILD (interstitial lung disease) ([place]) J84.9   2. Hypersensitivity pneumonitis ([place]) J67.9   3. Irritable larynx syndrome J38.7        Plan:      FVC in lung function worse but dLCO and walk tst are same since FEb 2019 Not sure if true decline or worse due to cough during testing (likely) or pollen related acute bronchitis  Plan  - Please take Take prednisone 40mg  once daily x 3 days, then 30mg  once daily x 3 days, then 20mg  once daily x 3 days, then prednisone 10mg  once daily  X to continue - continue singulair for allergies  - Take ILD PRO consent form - we  wil call you next few to several weeks if you meet criteria  Followup 2-3 months do Pre-bd spiro and dlco only. No lung volume or bd response. No post-bd spiro Return to see Dr Chase Caller in ILD clinic or 30 min time slot in morning clinic     > 50% of this > 25 min visit spent in face to face counseling or coordination of care    Dr. Brand Males, M.D., [facility].C.P Pulmonary and Critical Care Medicine Staff Physician, [place] Director - Interstitial Lung Disease  Program  Pulmonary [place] at [place], [place], [address]  Pager: [phone], If no answer or between  15:00h - 7:00h: call [phone] Telephone: [phone]

## 2018-04-13 NOTE — Patient Instructions (Signed)
ICD-10-CM   1. ILD (interstitial lung disease) ([place]) J84.9   2. Hypersensitivity pneumonitis ([place]) J67.9   3. Irritable larynx syndrome J38.7     FVC in lung function worse but dLCO and walk tst are same since FEb 2019 Not sure if true decline or worse due to cough during testing (likely) or pollen related acute bronchitis  Plan  - Please take Take prednisone 40mg  once daily x 3 days, then 30mg  once daily x 3 days, then 20mg  once daily x 3 days, then prednisone 10mg  once daily  X to continue - continue singulair for allergies  - Take ILD PRO consent form - we wil call you next few to several weeks if you meet criteria  Followup 2-3 months do Pre-bd spiro and dlco only. No lung volume or bd response. No post-bd spiro Return to see Dr Chase Caller in ILD clinic or 30 min time slot in morning clinic

## 2018-04-13 NOTE — Progress Notes (Signed)
Patient had pre spiro and dlco today.

## 2018-04-22 NOTE — Progress Notes (Signed)
Title: Chronic Fibrosing Interstitial Lung Disease with Progressive Phenotype Prospective Outcomes (ILD-PRO) Registry ( ClinicalTrials.gov identifier [id number])  Protocol for  04/22/2018  is Version June 21, 2017 Consent for  04/22/2018  is Version November 03, 2017 revised February 28, 2018  Synopsis: To collect data & biological samples to support future research in chronic fibrosing ILDs. Registry will describe current approaches to diagnosis & treatment of chronic fibrosing ILDs, analyze participant characteristics to describe the history of disease, assess QOL from self-administered questionnaires, describe participant interactions with health care systems, describe other ILD treatment practices across institutions, and utilize biological samples linked to well characterized other ILD participants to ID disease biomarkers.                                                                                     Key Inclusion Criteria:  -Age >/= 67 Years  -Diagnosis of non-IPF ILD of any duration   -Chronic fibrosing ILD defined by reticular abnormality with traction bronchiectasis with/out honeycombing confirmed by chest HRCT scan and/or LB.  -Progressive phenotype defined by fulfilling at least ONE  of criteria below within last 12 months  ?10%  decline in FVC % pred   ?5 - <10% decline in FVC % pred + i worsening of respiratory symptoms   ?5 - <10% decline in FVC % pred + increasing extent of fibrotic changes on HRCT   worsening of respiratory symptoms +  increase of fibrotic changes on HRCT independent of FVC change.   Key Exclusion Criteria:  -Malignancy, other than skin or early stage prostate cancer, </= 5 years  -listed for lung transplantation at the time of enrollment  - enrolled in other  clinical trial   Clinical Research  Coordinator note : This visit for Subject Rhonda [name] with DOB: [birth year] on [id number] for the above protocol is Visit/Encounter Enrollment/Baseline assesments  and is for purpose of Research . The consent for this encounter is under Protocol Version date June 21, 2017 and  Is currently IRB approved. The subject expressed interest and consent in starting as a study subject. Subject confirmed that there is no change in contact information (e.g. address, telephone, email). Subject thanked for participation in research and contribution to science.    In this visit 04/22/2018 Informed consent was obtained from the subject and the subject underwent Baseline screening assessments as directed per the above stated protocol. The subject was given a copy of the ICF on [id number] during a routine care follow up visit. The patient states today that she has read the consent in detail. She stated that she did not have any questions regarding the trial. In a quiet, private desk in our office I went over the ICF form with the subject. After review of ICF the subject was given ample time to ask any questions. All questions were answered to the subjects satisfaction and the subject and I signed the ICF. The subject was given a signed copy of the Form for her records.   During the research visit the subject was given an opportunity to provide [place] with a HCPOA (health care power of attorney) The subject accepted and identified Rhonda [name] her  husband as her 61. The husband was not present with her today in clinic, however, the subject stated in the event of a future loss of cognitive ability or any diminished capacity her husband is authorized to act on her behalf.   All protocol specified study assessments were conducted after subject signing of ICF. For further documentation of today's visit please refer to the subjects paper source binder.     Additional details Because this visit is a key visit of Enrollment this  visit is under direct supervision of the PI Dr. Chase Caller . This PI was available for this visit and during the consenting process    T. Early Chars BS, [place], [place] 3:06 [place] 04/22/2018

## 2018-04-25 ENCOUNTER — Encounter: Payer: Self-pay | Admitting: Pediatrics

## 2018-04-25 ENCOUNTER — Ambulatory Visit (INDEPENDENT_AMBULATORY_CARE_PROVIDER_SITE_OTHER): Payer: Medicare Other | Admitting: Pediatrics

## 2018-04-25 VITALS — BP 120/60 | HR 62 | Temp 98.0°F | Resp 16 | Ht 59.8 in | Wt 144.0 lb

## 2018-04-25 DIAGNOSIS — J301 Allergic rhinitis due to pollen: Secondary | ICD-10-CM

## 2018-04-25 DIAGNOSIS — J8410 Pulmonary fibrosis, unspecified: Secondary | ICD-10-CM | POA: Diagnosis not present

## 2018-04-25 DIAGNOSIS — B372 Candidiasis of skin and nail: Secondary | ICD-10-CM | POA: Diagnosis not present

## 2018-04-25 DIAGNOSIS — J4530 Mild persistent asthma, uncomplicated: Secondary | ICD-10-CM

## 2018-04-25 DIAGNOSIS — J849 Interstitial pulmonary disease, unspecified: Secondary | ICD-10-CM | POA: Diagnosis present

## 2018-04-25 MED ORDER — ALBUTEROL SULFATE HFA 108 (90 BASE) MCG/ACT IN AERS: 2.0000 | INHALATION_SPRAY | RESPIRATORY_TRACT | 3 refills | Status: DC | PRN

## 2018-04-25 MED ORDER — MONTELUKAST SODIUM 10 MG PO TABS: 10.0000 mg | ORAL_TABLET | Freq: Every day | ORAL | 0 refills | Status: DC

## 2018-04-25 NOTE — Patient Instructions (Addendum)
Continue montelukast 10 mg once a day to prevent cough or wheeze Continue Breo Ellipta 100 mg one puff once a day to prevent cough or wheeze Continue ProAir 2 puffs every 4 hours as needed for cough or wheeze Continue Flonase nasal spray 1 or 2 sprays in each nostril once aa day as needed for a stuffy nose. Follow up with Dr. Chase Caller  Consider nasal saline rinses Clotrimazole over the counter for red areas on both thighs twice a day for 2 weeks  Continue the other medications as listed in the chart  Call me if this treatment plan is not working out for you  Follow up in 6 months or sooner if needed

## 2018-04-25 NOTE — Progress Notes (Signed)
[place] [address] Dept: [phone]  FOLLOW UP NOTE  Patient ID: Rhonda [name], female    DOB: January 18, 1951  Age: 67 y.o. MRN: [id number] Date of Office Visit: 04/25/2018  Assessment  Chief Complaint: Allergies (having to use prednisone alot, rash around underwear line and on back)  HPI Rhonda [name] is a 80 year olf female who presents to the clinic for a follow up visit. She was last seen in this clinic on 02/10/2016 by Dr. Shaune Leeks for evaluation of asthma and allergic rhinitis. At that time, she was doing well with montelukast 10 mg once a day, prednisone 5 mg once a day, loratadine 10 mg once a day and Flonase 1-2 sprays in each nostril once a day. She is followed by Dr. Chase Caller for pulmonary fibrosis secondary to hypersensitivity pneumonitis.   In the interim, Rhonda [name] reports that she has needed at least 3 prednisone bursts this year with the last in May 2019 for an upper respiratory infection when she required an antibiotic and prednisone for resolution of symptoms. She reports her daily dose of prednisone is now 10 mg once a day.   At today's visit, she reports that she has "had a rough winter season". She reports her asthma is moderately well controlled with some shortness of breath with activity. She denies wheezing. She is currently using Breo Ellipta 100- 1 puff once a day, ProAir about every other day, and montelukast 10 mg once a day.  Allergic rhinitis is reported as moderately well controlled with symptoms including clear nasal drainage and intermittent cough with thick white sputum. She reports the cough is the worst when she wakes up in the morning. Chewing gum helps to reduce the cough. She reports the cough has been worse during this allergy season. She denies fever. She is currently using Flonase nasal spray once or twice a day. She reports she has tried nasal saline rinses with no success.  She reports an itch that frequently occurs on the right  side of her upper back. She also reports a red, dry rash that has occurred in the folds between her thighs and peri area over the last week or two. She reports this rash is somewhat itchy.   Her current medications are listed in the chart.   Drug Allergies:  Allergies  Allergen Reactions  . Atorvastatin Other (See Comments)    Leg pain  . Betadine [Povidone Iodine] Other (See Comments)    blisters  . Codeine Nausea And Vomiting  . Rosuvastatin Other (See Comments)    Leg pain  . Shellfish Allergy Other (See Comments)    vomiting  . Sulfonamide Derivatives Other (See Comments)    headaches    Physical Exam: BP 120/60   Pulse 62   Temp 98 F (36.7 C) (Oral)   Resp 16   Ht 4' 11.8" (1.519 m)   Wt 144 lb (65.3 kg)   BMI 28.31 kg/m    Physical Exam  Constitutional: She is oriented to person, place, and time. She appears well-developed and well-nourished.  HENT:  Head: Normocephalic.  Right Ear: External ear normal.  Left Ear: External ear normal.  Bilateral nares slightly erythematous and edematous. Pharynx erythematous with cobblestone appearance. Ears normal. Eyes normal.   Eyes: Conjunctivae are normal.  Neck: Normal range of motion. Neck supple.  Cardiovascular: Normal rate, regular rhythm and normal heart sounds.  No murmur noted.  Pulmonary/Chest: Effort normal.  Bilateral scattered crackles.   Musculoskeletal: Normal range  of motion.  Neurological: She is alert and oriented to person, place, and time.  Skin: Skin is warm and dry.  Skin on right upper back with no redness, dry, or flaky skin. Bilateral inner thigh with reddened areas. No open areas or drainage noted.   Psychiatric: She has a normal mood and affect. Her behavior is normal. Judgment and thought content normal.    Diagnostics: FVC 1.72, FEV1 1.44. Predicted FVC 2.54, predicted FEV1 1.92. Spirometry reveals mild restriction which is consistent with pervious tests.    Assessment and Plan: 1. Mild  persistent asthma without complication   2. Allergic rhinitis due to pollen, unspecified seasonality   3. Pulmonary fibrosis ([place])   4. Yeast dermatitis     Meds ordered this encounter  Medications  . montelukast (SINGULAIR) 10 MG tablet    Sig: Take 1 tablet (10 mg total) by mouth daily.    Dispense:  30 tablet    Refill:  0    One refill and make appt  . albuterol (PROAIR HFA) 108 (90 Base) MCG/ACT inhaler    Sig: Inhale 2 puffs into the lungs every 4 (four) hours as needed for wheezing. Or coughing spells.  You may use 2 Puffs 5-10 minutes before exercise.    Dispense:  1 Inhaler    Refill:  3    Patient Instructions  Continue montelukast 10 mg once a day to prevent cough or wheeze Continue Breo Ellipta 100 mg one puff once a day to prevent cough or wheeze Continue ProAir 2 puffs every 4 hours as needed for cough or wheeze Continue Flonase nasal spray 1 or 2 sprays in each nostril once aa day as needed for a stuffy nose. Follow up with Dr. Chase Caller  Consider nasal saline rinses Clotrimazole over the counter for red areas on both thighs twice a day for 2 weeks  Continue the other medications as listed in the chart  Call me if this treatment plan is not working out for you  Follow up in 6 months or sooner if needed   Return in about 6 months (around 10/25/2018), or if symptoms worsen or fail to improve.   Thank you for the opportunity to care for this patient.  Please do not hesitate to contact me with questions.  Gareth Morgan, FNP [facility]  I have provided oversight concerning Gareth Morgan' evaluation and treatment of this patient's health issues addressed during today's encounter. I agree with the assessment and therapeutic plan as outlined in the note.   Thank you for the opportunity to care for this patient.  Please do not hesitate to contact me with questions.  Penne Lash, M.D.  [facility] [address] [place], [place] [address] [phone]

## 2018-05-07 ENCOUNTER — Other Ambulatory Visit: Payer: Self-pay | Admitting: Internal Medicine

## 2018-05-14 ENCOUNTER — Other Ambulatory Visit: Payer: Self-pay | Admitting: Internal Medicine

## 2018-05-16 ENCOUNTER — Ambulatory Visit: Payer: Medicare Other | Admitting: Internal Medicine

## 2018-05-24 DIAGNOSIS — Z0001 Encounter for general adult medical examination with abnormal findings: Secondary | ICD-10-CM | POA: Diagnosis not present

## 2018-05-24 DIAGNOSIS — Z01419 Encounter for gynecological examination (general) (routine) without abnormal findings: Secondary | ICD-10-CM | POA: Diagnosis not present

## 2018-05-24 DIAGNOSIS — R300 Dysuria: Secondary | ICD-10-CM | POA: Diagnosis not present

## 2018-05-24 DIAGNOSIS — N761 Subacute and chronic vaginitis: Secondary | ICD-10-CM | POA: Diagnosis not present

## 2018-05-24 DIAGNOSIS — N952 Postmenopausal atrophic vaginitis: Secondary | ICD-10-CM | POA: Diagnosis not present

## 2018-05-24 DIAGNOSIS — R7301 Impaired fasting glucose: Secondary | ICD-10-CM | POA: Diagnosis not present

## 2018-05-24 DIAGNOSIS — J8410 Pulmonary fibrosis, unspecified: Secondary | ICD-10-CM | POA: Diagnosis not present

## 2018-05-24 DIAGNOSIS — B373 Candidiasis of vulva and vagina: Secondary | ICD-10-CM | POA: Diagnosis not present

## 2018-05-29 ENCOUNTER — Other Ambulatory Visit: Payer: Self-pay | Admitting: Family Medicine

## 2018-07-12 ENCOUNTER — Ambulatory Visit: Payer: Medicare Other | Admitting: Internal Medicine

## 2018-07-15 ENCOUNTER — Ambulatory Visit: Payer: Medicare Other | Admitting: Internal Medicine

## 2018-07-29 ENCOUNTER — Other Ambulatory Visit: Payer: Self-pay | Admitting: Cardiology

## 2018-08-02 DIAGNOSIS — Z85828 Personal history of other malignant neoplasm of skin: Secondary | ICD-10-CM | POA: Diagnosis not present

## 2018-08-02 DIAGNOSIS — L821 Other seborrheic keratosis: Secondary | ICD-10-CM | POA: Diagnosis not present

## 2018-08-02 DIAGNOSIS — D225 Melanocytic nevi of trunk: Secondary | ICD-10-CM | POA: Diagnosis not present

## 2018-08-02 DIAGNOSIS — D1800 Hemangioma unspecified site: Secondary | ICD-10-CM | POA: Diagnosis not present

## 2018-08-02 DIAGNOSIS — L814 Other melanin hyperpigmentation: Secondary | ICD-10-CM | POA: Diagnosis not present

## 2018-08-16 ENCOUNTER — Ambulatory Visit (INDEPENDENT_AMBULATORY_CARE_PROVIDER_SITE_OTHER): Payer: Medicare Other | Admitting: Internal Medicine

## 2018-08-16 ENCOUNTER — Encounter: Payer: Self-pay | Admitting: Internal Medicine

## 2018-08-16 VITALS — BP 118/62 | HR 77 | Ht 59.0 in | Wt 144.0 lb

## 2018-08-16 DIAGNOSIS — J679 Hypersensitivity pneumonitis due to unspecified organic dust: Secondary | ICD-10-CM

## 2018-08-16 DIAGNOSIS — J849 Interstitial pulmonary disease, unspecified: Secondary | ICD-10-CM

## 2018-08-16 DIAGNOSIS — R0789 Other chest pain: Secondary | ICD-10-CM

## 2018-08-16 DIAGNOSIS — K589 Irritable bowel syndrome without diarrhea: Secondary | ICD-10-CM | POA: Diagnosis not present

## 2018-08-16 LAB — PULMONARY FUNCTION TEST
DL/VA % PRED: 88 %
DL/VA: 3.6 ml/min/mmHg/L
DLCO unc % pred: 52 %
DLCO unc: 9.21 ml/min/mmHg
FEF 25-75 Pre: 2.37 L/sec
FEF2575-%Pred-Pre: 143 %
FEV1-%PRED-PRE: 81 %
FEV1-PRE: 1.49 L
FEV1FVC-%Pred-Pre: 118 %
FEV6-%Pred-Pre: 71 %
FEV6-PRE: 1.65 L
FEV6FVC-%PRED-PRE: 105 %
FVC-%Pred-Pre: 67 %
FVC-PRE: 1.65 L
Pre FEV1/FVC ratio: 90 %
Pre FEV6/FVC Ratio: 100 %

## 2018-08-16 MED ORDER — DICYCLOMINE HCL 10 MG PO CAPS: 10.0000 mg | ORAL_CAPSULE | Freq: Two times a day (BID) | ORAL | 1 refills | Status: DC | PRN

## 2018-08-16 NOTE — Progress Notes (Signed)
#GE reflux with small hiatal hernia  - on ppi   #History of rapid heart rate not otherwise specified  - Start her on Lopressor  2008/2009 by primary care physician. History appears to correlate with onset of respiratory issues  - refuses to dc this drug as of 2014 discussion   # Hypersensitivity Pneumonitis and ILD  - Potential etiologies - cockateil x 2 for 18 years through end 2012. 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 Denies mold but lives in house built in [place] with a "weird baselment: and has humidifier  - first noted on CT chest 10/15/09 following trip to [place] (PE negative) but not described in 2003 CT chest report  - autoimmune panel: 10/23/11: Negative  -  Uderwennt bronch 11/19/11  - non diagnostic  - VATS Nov 2013 (done after initially refusing)- [place]. However, there is worrying trend of UIP pattern in the Upper lobes.            Oct/Nov 2012  March 2013 08/11/12  Nov/Dec 2013 Dx HP/UIP 01/02/2013  02/28/13 05/25/2013  12/06/2013  03/15/2014  06/29/2014  10'27/15 12/19/2014  02/26/15 01/13/2016  04/29/2016  10/23/2016  01/25/2017  06/17/2017 [facility] July 2018 01/11/2018  04/13/2018  08/16/2018   Symp/Signs Dyspnea x5y  New crackles  dimished dyspnea except at hill. Improved crackles.  Improved dyspnea except @ hill                 FVC  2.6 L    2.2 L/82%   2.1 L/80%   2.19 L/82%  2.2L/91% 2.1/81% 1.86/69% 1.9L/71% 1.86/75% 1.75L/65% 1.82/73% (dec 2015 offce spiro) 2.84L/72% 1.54/61% 1.78L/67%  1.69L/65%  1.61/62% 1.35/55% 1.65/67%  DLCO  10/50%    10/51%   9.6/51%     11.9/63% 11.7/62%  11.561%  5.5/31% 10.06/57% 11/04/63%  10.43/55% 8.56/50% 10.57/61% 10.16/57% 9.21/52%  Walk test 185 ft x 3 laps on RA  rest 92%. Pk exertion 90%. Pk HR 108 Rest 92%. Pk exertion 86% at 2.5 laps. Pk HR 102  pk exertion - 88% Rest 96%. Pk 95%. Pk HR is 75. Done at full dose  lopressor Rest 94%. Pk 91%. Rest HR 93 with pk HR 103/min. Done at half dose lopressor REst 96%. Pk pulse ox 92%. Rest HR 65. Pk HR 86 Did not desat. Normal Pk HR  Pulse ox 96%, droped to89% at 2nd lap but bounced up and ended at 91% 3rd lap Did not desat Did not desat, Pk HHR 93  ;llowest pulse ox 90% at 2nd lap but she held her breath, then 92% at 3rd lap 99% -> 91% at end of 3rd lap 94%, Dropped to 87% at 3rd lap, HR peak102/m 97%, dropped to 89% in 3rd lap - asymptomatic. HR 100/min No prob with [facility]md at [facility] 99% rest - -> dopped to 92% 3rd lap. Pkr HR 107/min 98% rest -> 91% 3rd lap with Pk HR 91/min   CT chest   unchanged          yes Ct mid dec possibly worse - unsure Ct - similar to April 2016 but worse since 2013   CT 06/24/17 - similar to June 2017     Tests/Bx Bronch 0- non diagnostic   VATS - HP with UIP in Upper lobe  RX    Rx pred start 11/23/12 On  pred 37m per day and will reduce to 275mOn Pred 204mer day On pred 91m38mr day On pred 5mg 69m day Rx pred 5mg d35my She reduced to pred 5mg x 62mt 1 month QOD due to GI concern side effects On pred 5mg qod32mt will increase to 5/d after this visit On pred 5mg dail16m On pred 5mg daily58mncrease pred to 91mg per d84meduce to pred 7.5 due to skin sissues  pred burs and reduce to 91mg per da47m                         OV 06/17/2017  Chief Complaint  Patient presents with  . Follow-up    [name]. Pt denies significant cough, denies CP/tightness, f/c/s.     She is better. Feels she does not need o2. Has been to [facility] for transplant clinic; felt too early. Seems higher dose prednisone helping but then she is also bettter t his time of the year. In May 2018 I was at [facility] and curbsided national thought leaders - feel that we could explore silent active GERD as a possibility of ILD getting wors with time.   OV 01/11/2018  Chief Complaint  Patient presents with  . Follow-up    PFT done today.  Pt  states she has been coughing x2 weeks since she returned from a trip to [place]. FroDelawareing, pt has had pain in ribs. Pt states she has mild SOB which is stable due to rehab.  Pt states that Dr. Richter is wDarron Doomto know if pt could possibly have eosinophilic esophagitis.   Went to [place]. RetDelawarend has been coughing x 2 weeks. Associated with this is some rib pain on right side. She feels she does not need o2. She feels she does not need pred/abx at this time. She also exposed to flu wit family   OV 02/08/2018  Chief Complaint  Patient presents with  . Acute Visit    CXR done 02/07/18.  Pt states her throat is sore but not as bad as it was yesterday when in office and does have some left ear pain. Pt states she has some pain on right rib cage and has increased fatigue.     Ms. Wile preMorminoutely.  I last saw her one month ago gave her Tamiflu for prophylaxis after flu exposure.  At that point in time she was already reducing her chronic stable prednisone of 10 mg for interstitial lung disease/hypersensitivity pneumonitis.  She had tapered herself slowly 3 or 4 weeks ago to 5 mg.  She says she was doing fairly okay but in the last 2 or 3 weeks she has had increased fatigue.  She says she goes daily swimming and then feels energized but then an hour later starts having fatigue which is more than usual.  Also in the daytime she has random fatigue.  In terms of her respiratory symptoms these are stable and in fact slightly better in terms of shortness of breath and cough but she is having sore throat without any fever and some left-sided otalgia.  In addition she is having right-sided chest pains that are more than usual.  The chest pains have been reported before.  These are specifically at localized spots along the previous incision several years ago for interstitial lung disease.  They  are tender as well to touch.  This pain is worse.  In addition she is also being bothered by arthralgia  in her hand and left hip.  The left hip arthralgias old with a hand arthralgias new.  She is worried about pneumothorax recurrence and so she had a chest x-ray yesterday that shows no pneumothorax.  She has stable interstitial lung disease changes but in my personal visualization these interstitial lung disease changes are worse compared to several years ago.  We do know that she has slowly worsening interstitial lung disease.  In terms of a walking desaturation test this shows stability since last visit.  She has seen GI for acid reflux and has pH probe study coming up  In talking to her she tells me she has been on metformin for over a month or 2.  This is new and is meant for pre-diabetes.  Apparently her hemoglobin A1c is always below 6 but is above 5.5.  Walking desaturation test on 02/08/2018 185 feet x 3 laps on ROOM AIR:  did walk normal pace with forehead probe desaturate. Rest pulse ox was 99%, final pulse ox was 93%. HR response 72/min at rest to 93/min at peak exertion. Patient DESERAY DAPONTE  Did not Desaturate < 88% . Rhonda [name] yes did  Desaturated </= 3% points. Rhonda [name] yes did get tachyardic   Dg Chest 2 View  Result Date: 02/07/2018 CLINICAL DATA:  Chest wall pain for 2 weeks EXAM: CHEST - 2 VIEW COMPARISON:  CT 08/20/2017, radiograph 10/11/2015 FINDINGS: Coarse interstitial pattern consistent with pulmonary fibrosis, similar distribution compared to prior. No focal pulmonary opacity or pleural effusion. Stable cardiomediastinal silhouette with aortic atherosclerosis. No pneumothorax. Degenerative changes of the spine. IMPRESSION: No active cardiopulmonary disease. Similar appearance of diffuse pulmonary fibrosis. Negative for a pneumothorax. Electronically Signed   By: Donavan Foil M.D.   On: 02/07/2018 14:26    OV 04/13/2018  Chief Complaint  Patient presents with  . Follow-up    Pt had pre spiro and DLCO PFT prior to OV. Pt has increase of productive  cough-thick white in last week. Pt has some wheezing, and allergy issues.   Ms. Freda Munro presents for routine follow-up.  I saw her acutely just approximately 2 months ago.  Then end of April 2019 she saw a nurse practitioner again acutely and was given antibiotic and prednisone.  She says she felt better after that but in the last week or so due to increased pollen load in the community and also moving furniture because her daughter is relocating out of her home she started having more cough.  There is no change in her dyspnea with the cough is really bad.  This is despite Singulair and Chlor-Trimeton.  Those things to help but just take the edge off.  She feels that the pollen making the cough worse.  There is no change in dyspnea.  Walking desaturation test documented below his baseline.  She had pulmonary function test today but the [facility] shows significant decline compared to February 2019 and she feels surprised by this.  DLCO is baseline unchanged and a walking desaturation test limited in the office is also unchanged.  She does not have any fever but has significant postnasal drip.  She has not followed up at [facility] transplant clinic or the allergist recently.  OV 08/16/2018  Subjective:  Patient ID: Rhonda [name], female , DOB: 10/23/1951 , age 67 y.o. , MRN: [id number] , ADDRESS:  [id number]   08/16/2018 -   Chief Complaint  Patient presents with  . Follow-up    PFT performed today.  Pt states she has had a lot of mucus the month of September 2019 so she has been taking the Chlor-Trimeton. States other than that she has been able to do a lot more activities and has been swimming a lot more. Pt states she believes the SOB is stable.     HPI Rhonda [name] 68 y.o. -continues to do well.  She is taking higher dose of prednisone 10 mg/day.  Also the summer usually she is doing well.  She is not having much of a cough.  Lung function test shows improvement  compared to tests done earlier this year.  In fact she is almost as good as much 2018.  Still compared to 2014-2016 she has progressive lung disease.  Walking desaturation test is stable.  She needs a high-dose flu shot but we do not have stock today.  She has lost some weight intentionally with better dietary control.  She is exercising regularly swimming.  We discussed ofev  potential future therapy if the drug is approved for this indication.  We will know study results in a year or so.  She is open to the idea but is worried somewhat about her irritable bowel syndrome flaring up.  She is no longer following at the [facility] lung transplant clinic given stability lung function  Other issues: She always has right-sided chest wall pain to the site of lung biopsy.  The only way this is been resolved as by her limiting her use of bra .  Also her irritable bowel syndrome is under control but in case it flares up she wants a refill of dicyclomine    Results for [name], Rhonda (MRN [id number]) as of 08/16/2018 09:32  Ref. Range 12/16/2015 16:31 04/29/2016 12:44 01/25/2017 10:16 01/11/2018 15:19 04/13/2018 08:47 08/16/2018 09:00  FVC-Pre Latest Units: L 1.42  1.69 1.61 1.35 1.65  FVC-%Pred-Pre Latest Units: % 56 67 65 62 55 67   Results for [name], Rhonda A (MRN [id number]) as of 08/16/2018 09:32  Ref. Range 12/16/2015 16:31 04/29/2016 12:44 01/25/2017 10:16 01/11/2018 15:19 04/13/2018 08:47 08/16/2018 09:00  DLCO unc Latest Units: ml/min/mmHg 10.06 12.12 10.43 10.57 10.16 9.21  DLCO unc % pred Latest Units: % 57 64 55 56 57 52    Simple office walk 185 feet x  3 laps goal with forehead probe 04/13/2018  08/16/2018   O2 used Room air Room air  Number laps completed 3 3  Comments about pace good Moderate pace  Resting Pulse Ox/HR 98% and 73/min 98% and HR 77/min  Final Pulse Ox/HR 91% and 91/min 93% and 92.min  Desaturated </= 88% no no  Desaturated <= 3% points yes yes  Got Tachycardic >/= 90/min yes yes   Symptoms at end of test none none  Miscellaneous comments x x      ROS - per HPI     has a past medical history of Allergy, Arthritis, Asthma, Cataract, Coronary artery calcification seen on CAT scan (08/19/2017), Depression, DOE (dyspnea on exertion), GERD (gastroesophageal reflux disease), H/O steroid therapy, Heart palpitations (06/25/2548), Helicobacter pylori ab+, Hiatal hernia, High cholesterol, History of chronic bronchitis, Hyperlipidemia, mixed (08/19/2017), Hyperplastic colon polyp (2007), Inguinal hernia, Insulin resistance, Interstitial lung disease (HCC), MVP (mitral valve prolapse), Pneumonitis, hypersensitivity (HCC), Pulmonary fibrosis ([place]), Pulmonary HTN ([place]), PVC (premature ventricular contraction) (08/19/2017), and Rapid  heart rate.   reports that she has never smoked. She has never used smokeless tobacco.  Past Surgical History:  Procedure Laterality Date  . [place] STUDY N/A 02/21/2018   Procedure: [place] STUDY;  Surgeon: Mauri Pole, MD;  Location: [facility];  Service: Endoscopy;  Laterality: N/A;  . BREAST BIOPSY Right 2009   BIOPSY  . BREAST BIOPSY Left 2003   Benign   . CATARACT EXTRACTION Left   . CESAREAN SECTION    . ESOPHAGEAL MANOMETRY N/A 02/21/2018   Procedure: ESOPHAGEAL MANOMETRY (EM);  Surgeon: Mauri Pole, MD;  Location: [facility];  Service: Endoscopy;  Laterality: N/A;  . FOOT FRACTURE SURGERY  2006 or 2007   right  . HYMENECTOMY    . LUNG BIOPSY  09/28/2012   Procedure: LUNG BIOPSY;  Surgeon: Melrose Nakayama, MD;  Location: [place];  Service: Thoracic;  Laterality: N/A;  lung biopsies tims three  . SQUAMOUS CELL CARCINOMA EXCISION Left    left arm  . TOTAL HIP ARTHROPLASTY Right 09/10/2015   Procedure: RIGHT TOTAL HIP ARTHROPLASTY ANTERIOR APPROACH;  Surgeon: Paralee Cancel, MD;  Location: [facility];  Service: Orthopedics;  Laterality: Right;  . TUBAL LIGATION    . VIDEO ASSISTED THORACOSCOPY  09/28/2012   Procedure:  VIDEO ASSISTED THORACOSCOPY;  Surgeon: Melrose Nakayama, MD;  Location: [place];  Service: Thoracic;  Laterality: Right;  Mar[place] VIDEO BRONCHOSCOPY  11/19/2011   Procedure: VIDEO BRONCHOSCOPY WITH FLUORO;  Surgeon: Brand Males, MD;  Location[facility];  Service: Endoscopy;;    Allergies  Allergen Reactions  . Atorvastatin Other (See Comments)    Leg pain  . Betadine [Povidone Iodine] Other (See Comments)    blisters  . Codeine Nausea And Vomiting  . Rosuvastatin Other (See Comments)    Leg pain  . Shellfish Allergy Other (See Comments)    vomiting  . Sulfonamide Derivatives Other (See Comments)    headaches    Immunization History  Administered Date(s) Administered  . Hepatitis A 05/23/2010  . Hepatitis B 06/23/2006  . Influenza Split 08/11/2012, 08/14/2017  . Influenza Whole 09/08/2011  . Influenza, High Dose Seasonal PF 08/24/2016, 07/24/2017  . Influenza,inj,Quad PF,6+ Mos 09/07/2013  . Influenza-Unspecified 09/26/2014, 07/27/2015  . MMR 05/23/2010  . Pneumococcal Conjugate-13 03/15/2014  . Pneumococcal Polysaccharide-23 01/11/2018  . Td 02/22/2003  . Tdap 10/28/2012  . Zoster 12/04/2010  . Zoster Recombinat (Shingrix) 02/15/2017, 05/17/2017    Family History  Problem Relation Age of Onset  . Emphysema Maternal Grandmother   . Asthma Maternal Grandmother   . Asthma Mother   . Osteoarthritis Mother   . Dementia Mother 15  . Lymphoma Father   . Diabetes Father   . Hypertension Sister   . Heart disease Sister   . Kidney disease Sister   . Lung disease Maternal Grandfather   . Bone cancer Paternal Grandfather   . Allergic rhinitis Daughter   . Breast cancer Paternal Aunt   . Ovarian cancer Maternal Aunt   . Breast cancer Cousin   . Colon cancer Neg Hx   . Angioedema Neg Hx   . Eczema Neg Hx   . Immunodeficiency Neg Hx   . Urticaria Neg Hx      Current Outpatient Medications:  .  acetaminophen (TYLENOL) 325 MG tablet, Take 650 mg by mouth every 6  (six) hours as needed. Reported on 02/10/2016, Disp: , Rfl:  .  albuterol (PROAIR HFA) 108 (90 Base) MCG/ACT inhaler, Inhale  2 puffs into the lungs every 4 (four) hours as needed for wheezing. Or coughing spells.  You may use 2 Puffs 5-10 minutes before exercise., Disp: 1 Inhaler, Rfl: 3 .  Alpha-D-Galactosidase (BEANO PO), Take 1 tablet by mouth as needed (if eating onion or garlic). , Disp: , Rfl:  .  Ascorbic Acid (VITAMIN C) 1000 MG tablet, Take 1,000 mg by mouth daily., Disp: , Rfl:  .  BREO ELLIPTA 100-25 MCG/INH AEPB, TAKE 1 PUFF BY MOUTH EVERY DAY, Disp: 180 each, Rfl: 1 .  Calcium-Vitamin D-Vitamin K (VIACTIV) 449-675-91 MG-UNT-MCG CHEW, Chew 2 tablets by mouth daily. , Disp: , Rfl:  .  chlorpheniramine (CHLOR-TRIMETON) 4 MG tablet, Take 4 mg by mouth daily as needed for allergies. , Disp: , Rfl:  .  ezetimibe (ZETIA) 10 MG tablet, TAKE 1 TABLET BY MOUTH EVERY DAY, Disp: 90 tablet, Rfl: 0 .  fluticasone (FLONASE) 50 MCG/ACT nasal spray, SPRAY 2 SPRAYS INTO EACH NOSTRIL EVERY DAY, Disp: 1 g, Rfl: 12 .  metoprolol succinate (TOPROL-XL) 50 MG 24 hr tablet, TAKE 1 TABLET BY MOUTH EVERY DAY IMMEDIATELY FOLLOWING A MEAL. (Patient taking differently: Take 12.5 mg by mouth daily. TAKE 1/2 TABLET BY MOUTH EVERY DAY IMMEDIATELY FOLLOWING A MEAL.), Disp: 30 tablet, Rfl: 0 .  montelukast (SINGULAIR) 10 MG tablet, TAKE 1 TABLET BY MOUTH EVERY DAY, Disp: 30 tablet, Rfl: 3 .  Multiple Vitamin (MULTIVITAMIN) tablet, Take 1 tablet by mouth daily., Disp: , Rfl:  .  Naproxen Sodium (ALEVE) 220 MG CAPS, Take 1-2 capsules by mouth as needed., Disp: , Rfl:  .  omeprazole (PRILOSEC) 20 MG capsule, Take 1 capsule (20 mg total) by mouth daily. (Patient taking differently: Take 10 mg by mouth daily. Patient takes 10 mg), Disp: 30 capsule, Rfl: 5 .  pravastatin (PRAVACHOL) 40 MG tablet, Take 40 mg by mouth daily., Disp: , Rfl:  .  predniSONE (DELTASONE) 10 MG tablet, Take 10 mg by mouth daily with breakfast., Disp: ,  Rfl:  .  Probiotic Product (PROBIOTIC ADVANCED PO), Take 2 capsules by mouth daily. , Disp: , Rfl:  .  Turmeric 500 MG CAPS, Take 1 capsule by mouth daily., Disp: , Rfl:  .  AMBULATORY NON FORMULARY MEDICATION, Take 2 capsules by mouth daily. Cinnsulin cinnamon caplsule 1 by mouth daily , Disp: , Rfl:       Objective:   Vitals:   08/16/18 0937  BP: 118/62  Pulse: 77  SpO2: 98%  Weight: 144 lb (65.3 kg)  Height: '4\' 11"'  (1.499 m)    Estimated body mass index is 29.08 kg/m as calculated from the following:   Height as of this encounter: '4\' 11"'  (1.499 m).   Weight as of this encounter: 144 lb (65.3 kg).  '@WEIGHTCHANGE' @  [employer]   08/16/18 0937  Weight: 144 lb (65.3 kg)     Physical Exam  General Appearance:    Alert, cooperative, no distress, appears stated age - yes , sitting on - chair , Deconditioned looking - no  Head:    Normocephalic, without obvious abnormality, atraumatic  Eyes:    PERRL, conjunctiva/corneas clear,  Ears:    Normal TM's and external ear canals, both ears  Nose:   Nares normal, septum midline, mucosa normal, no drainage    or sinus tenderness. OXYGEN ON  - no . Patient is @ ra   Throat:   Lips, mucosa, and tongue normal; teeth and gums normal. Cyanosis on lips - no  Neck:  Supple, symmetrical, trachea midline, no adenopathy;    thyroid:  no enlargement/tenderness/nodules; no carotid   bruit or JVD  Back:     Symmetric, no curvature, ROM normal, no CVA tenderness  Lungs:     Distress - no , Wheeze no, Barrell Chest - no, Purse lip breathing - no, Crackles - mild scattered   Chest Wall:    No tenderness or deformity. Scars in chest yes from lung bx   Heart:    Regular rate and rhythm, S1 and S2 normal, no rub   or gallop, Murmur - no  Breast Exam:    NOT DONE  Abdomen:     Soft, non-tender, bowel sounds active all four quadrants,    no masses, no organomegaly  Genitalia:   NOT DONE  Rectal:   NOT DONE  Extremities:   Extremities normal,  atraumatic, Clubbing - no, Edema - no  Pulses:   2+ and symmetric all extremities  Skin:   Stigmata of Connective Tissue Disease - no  Lymph nodes:   Cervical, supraclavicular, and axillary nodes normal  Psychiatric:  Neurologic:   nl  CAm-ICU - neg, Alert and Oriented x 3 - yes, Moves all 4s - yes, Speech - nl, Cognition - normal           Assessment:       ICD-10-CM   1. Hypersensitivity pneumonitis ([place]) J67.9   2. ILD (interstitial lung disease) ([place]) J84.9   3. Right-sided chest wall pain R07.89   4. Irritable bowel syndrome, unspecified type K58.9        Plan:     Patient Instructions  Hypersensitivity pneumonitis ([place]) ILD (interstitial lung disease) ([place])  -Lung function is improved to March 2018 levels but compared to 2016 it is still progressive -I think the higher dose of prednisone is helping her lungs -Continue prednisone at 10 mg/day but you can adjust yourself to 5 mg/day if you feel so and monitor -Deferred flu shot today please get high-dose flu shot as soon as possible  Right-sided chest wall pain - glad this is better by limiting bra use; it is musculoskeletal from the scar of surgery for lung biopsy  Irritable bowel syndrome, unspecified type - ok to refill dicylomine 50m twice daily as needed - 30 day supply with  1 refill; after that with PCP RHayden Rasmussen MD   Followup - 6 months do spiro and dlco -Return to the interstitial lung disease clinic in 6 months or sooner if needed       SIGNATURE    Dr. MBrand Males M.D., F.C.C.P,  Pulmonary and Critical Care Medicine Staff Physician, CWinfieldDirector - Interstitial Lung Disease  Program  [place]ulmonary [place] [id number] Pager: [phone] If no answer or between  15:00h - 7:00h: call [phone] Telephone: [phone]  10:07 AM 08/16/2018

## 2018-08-16 NOTE — Progress Notes (Signed)
Spirometry and Dlco done today. 

## 2018-08-16 NOTE — Patient Instructions (Addendum)
Hypersensitivity pneumonitis (HCC) ILD (interstitial lung disease) ([place])  -Lung function is improved to March 2018 levels but compared to 2016 it is still progressive -I think the higher dose of prednisone is helping her lungs -Continue prednisone at 10 mg/day but you can adjust yourself to 5 mg/day if you feel so and monitor -Deferred flu shot today please get high-dose flu shot as soon as possible  Right-sided chest wall pain - glad this is better by limiting bra use; it is musculoskeletal from the scar of surgery for lung biopsy  Irritable bowel syndrome, unspecified type - ok to refill dicylomine 10mg  twice daily as needed - 30 day supply with  1 refill; after that with PCP Hayden Rasmussen, MD   Followup - 6 months do spiro and dlco -Return to the interstitial lung disease clinic in 6 months or sooner if needed

## 2018-08-16 NOTE — Addendum Note (Signed)
Addended by: Lorretta Harp on: 08/16/2018 10:14 AM   Modules accepted: Orders

## 2018-08-22 ENCOUNTER — Other Ambulatory Visit: Payer: Self-pay | Admitting: Internal Medicine

## 2018-08-22 DIAGNOSIS — Z23 Encounter for immunization: Secondary | ICD-10-CM | POA: Diagnosis not present

## 2018-08-23 ENCOUNTER — Telehealth: Payer: Self-pay | Admitting: Internal Medicine

## 2018-08-23 MED ORDER — PREDNISONE 10 MG PO TABS: 10.0000 mg | ORAL_TABLET | Freq: Every day | ORAL | 5 refills | Status: DC

## 2018-08-23 NOTE — Telephone Encounter (Signed)
Called and spoke with patient, verified pharmacy and refilled her prednisone.  Nothing further needed.

## 2018-08-30 ENCOUNTER — Other Ambulatory Visit: Payer: Self-pay | Admitting: Family Medicine

## 2018-09-13 ENCOUNTER — Other Ambulatory Visit: Payer: Self-pay | Admitting: Internal Medicine

## 2018-09-28 DIAGNOSIS — E119 Type 2 diabetes mellitus without complications: Secondary | ICD-10-CM | POA: Diagnosis not present

## 2018-09-28 DIAGNOSIS — H35372 Puckering of macula, left eye: Secondary | ICD-10-CM | POA: Diagnosis not present

## 2018-10-26 ENCOUNTER — Telehealth: Payer: Self-pay | Admitting: Internal Medicine

## 2018-10-26 MED ORDER — DOXYCYCLINE HYCLATE 100 MG PO TABS: 100.0000 mg | ORAL_TABLET | Freq: Two times a day (BID) | ORAL | 0 refills | Status: DC

## 2018-10-26 NOTE — Telephone Encounter (Signed)
Spoke with the pt and confirmed the pharm  Rx was sent

## 2018-10-26 NOTE — Telephone Encounter (Signed)
Rhonda [name] 10-20-1951 - contacted me 10/26/2018 AM directly. Says has cold. Wants doxycycline sent in.  She has 3d supply with her from before and is starting it. So please send Take doxycycline 100mg  po twice daily x 4 days; take after meals and avoid sunlight - so she can complete a 7 day course  [facility]  Let her know when sent

## 2018-10-28 ENCOUNTER — Other Ambulatory Visit: Payer: Self-pay | Admitting: *Deleted

## 2018-10-28 MED ORDER — FLUTICASONE FUROATE-VILANTEROL 100-25 MCG/INH IN AEPB: INHALATION_SPRAY | RESPIRATORY_TRACT | 1 refills | Status: DC

## 2018-10-30 ENCOUNTER — Other Ambulatory Visit: Payer: Self-pay | Admitting: Cardiology

## 2018-10-31 ENCOUNTER — Telehealth: Payer: Self-pay | Admitting: Internal Medicine

## 2018-10-31 ENCOUNTER — Ambulatory Visit (INDEPENDENT_AMBULATORY_CARE_PROVIDER_SITE_OTHER): Payer: Medicare Other | Admitting: Adult Health

## 2018-10-31 ENCOUNTER — Encounter: Payer: Self-pay | Admitting: Adult Health

## 2018-10-31 DIAGNOSIS — J849 Interstitial pulmonary disease, unspecified: Secondary | ICD-10-CM | POA: Diagnosis not present

## 2018-10-31 DIAGNOSIS — J209 Acute bronchitis, unspecified: Secondary | ICD-10-CM | POA: Diagnosis not present

## 2018-10-31 NOTE — Patient Instructions (Addendum)
Finish Doxycycline as discussed.  Taper Prednisone as discussed. -to Prednisone 10mg  daily .  Mucinex Twice daily  As needed   Delsym 2 tsp Twice daily  As needed  Cough.  Follow up with Dr. Chase Caller 6-8 weeks and As needed   Please contact office for sooner follow up if symptoms do not improve or worsen or seek emergency care.

## 2018-10-31 NOTE — Assessment & Plan Note (Signed)
Slow to resolve flare, improving with abx and steroids   Plan  Patient Instructions  Finish Doxycycline as discussed.  Taper Prednisone as discussed. -to Prednisone 10mg  daily .  Mucinex Twice daily  As needed   Delsym 2 tsp Twice daily  As needed  Cough.  Follow up with Dr. Chase Caller 6-8 weeks and As needed   Please contact office for sooner follow up if symptoms do not improve or worsen or seek emergency care.

## 2018-10-31 NOTE — Telephone Encounter (Signed)
lmtcb for pt. When pt calls back she needs to be scheduled for an acute visit next available with TP or Beth per below documentation..the patient was supposed to be seen today.

## 2018-10-31 NOTE — Progress Notes (Signed)
_0  ID: Rhonda [name], female    DOB: [birth year], 67 y.o.   MRN: [id number]  Chief Complaint  Patient presents with  . Acute Visit    Cough     Referring provider: Hayden Rasmussen, MD  HPI: 67 year old female followed for ILD, hypersensitivity pneumonitis     10/31/2018 Acute OV : Cough  Patient presents for an acute office visit.  She complains of 1 week of increased cough congestion and shortness of breath.  It was called in doxycycline for 5 days.  And a prednisone burst.  She is currently on prednisone 60 mg.  Patient says that she is some better.  But continues to have some intermittent cough and sinus congestion that is yellow.  Patient denies any pain orthopnea PND or increased leg swelling.  Appetite is good with no nausea vomiting or diarrhea.    Allergies  Allergen Reactions  . Atorvastatin Other (See Comments)    Leg pain  . Betadine [Povidone Iodine] Other (See Comments)    blisters  . Codeine Nausea And Vomiting  . Garlic     Severe diarrhea per patient  . Onion     Severe diarrhea per patient  . Rosuvastatin Other (See Comments)    Leg pain  . Shellfish Allergy Other (See Comments)    vomiting  . Sulfonamide Derivatives Other (See Comments)    headaches    Immunization History  Administered Date(s) Administered  . Hepatitis A 05/23/2010  . Hepatitis B 06/23/2006  . Influenza Split 08/11/2012, 08/14/2017  . Influenza Whole 09/08/2011  . Influenza, High Dose Seasonal PF 08/24/2016, 07/24/2017, 08/31/2018  . Influenza,inj,Quad PF,6+ Mos 09/07/2013  . Influenza-Unspecified 09/26/2014, 07/27/2015  . MMR 05/23/2010  . Pneumococcal Conjugate-13 03/15/2014  . Pneumococcal Polysaccharide-23 01/11/2018  . Td 02/22/2003  . Tdap 10/28/2012  . Zoster 12/04/2010  . Zoster Recombinat (Shingrix) 02/15/2017, 05/17/2017    Past Medical History:  Diagnosis Date  . Allergy   . Arthritis    history spinal stenosis. osteoarthritis right hip  .  Asthma   . Cataract   . Coronary artery calcification seen on CAT scan 08/19/2017   >300 on CT scan 08/2017  . Depression   . DOE (dyspnea on exertion)    a. 04/2010 Lexi MV EF 71%, no ischemia/infarct;    . GERD (gastroesophageal reflux disease)   . H/O steroid therapy    Steroid use orally over 4 yrs- for Lung Fibrosis  . Heart palpitations 02/28/2015  . Helicobacter pylori ab+   . Hiatal hernia   . High cholesterol   . History of chronic bronchitis    as child  . Hyperlipidemia, mixed 08/19/2017  . Hyperplastic colon polyp 2007  . Inguinal hernia    right  . Insulin resistance    past  . Interstitial lung disease ([place])   . MVP (mitral valve prolapse)    Posterior mitral valve leaflet with mild MR  . Pneumonitis, hypersensitivity ([place])    a. 09/2012 s/p Bx - ? 2/2 bird, mold, oil paint exposure ->on steroids, followed by pulm.  . Pulmonary fibrosis (H[facility])    Dr. Chase Caller follows- stable at present  . Pulmonary HTN ([place])    mild PASP 68mHg on echo 02/2015  . PVC (premature ventricular contraction) 08/19/2017  . Rapid heart rate    Dr. TRadford Paxfollows- last visit Epic note 9'16    Tobacco History: Social History   Tobacco Use  Smoking Status Never Smoker  Smokeless Tobacco Never Used  Tobacco Comment   pt states she experimented in college   Counseling given: Not Answered Comment: pt states she experimented in college   Outpatient Medications Prior to Visit  Medication Sig Dispense Refill  . acetaminophen (TYLENOL) 325 MG tablet Take 650 mg by mouth every 6 (six) hours as needed. Reported on 02/10/2016    . albuterol (PROAIR HFA) 108 (90 Base) MCG/ACT inhaler Inhale 2 puffs into the lungs every 4 (four) hours as needed for wheezing. Or coughing spells.  You may use 2 Puffs 5-10 minutes before exercise. 1 Inhaler 3  . Alpha-D-Galactosidase (BEANO PO) Take 1 tablet by mouth as needed (if eating onion or garlic).     . AMBULATORY NON FORMULARY MEDICATION Take 2 capsules by  mouth daily. Cinnsulin cinnamon caplsule 1 by mouth daily     . Ascorbic Acid (VITAMIN C) 1000 MG tablet Take 1,000 mg by mouth daily.    . Calcium-Vitamin D-Vitamin K (VIACTIV) [phone] MG-UNT-MCG CHEW Chew 2 tablets by mouth daily.     . chlorpheniramine (CHLOR-TRIMETON) 4 MG tablet Take 4 mg by mouth daily as needed for allergies.     Marland Kitchen dicyclomine (BENTYL) 10 MG capsule Take 1 capsule (10 mg total) by mouth 2 (two) times daily as needed for spasms. 60 capsule 1  . ezetimibe (ZETIA) 10 MG tablet Take 1 tablet (10 mg total) by mouth daily. Please make overdue appt with Dr. Radford Pax before anymore refills. 2nd attempt 15 tablet 0  . fluticasone (FLONASE) 50 MCG/ACT nasal spray SPRAY 2 SPRAYS INTO EACH NOSTRIL EVERY DAY 1 g 12  . fluticasone furoate-vilanterol (BREO ELLIPTA) 100-25 MCG/INH AEPB TAKE 1 PUFF BY MOUTH EVERY DAY 180 each 1  . metoprolol succinate (TOPROL-XL) 50 MG 24 hr tablet TAKE 1 TABLET BY MOUTH EVERY DAY IMMEDIATELY FOLLOWING A MEAL. (Patient taking differently: Take 12.5 mg by mouth daily. TAKE 1/2 TABLET BY MOUTH EVERY DAY IMMEDIATELY FOLLOWING A MEAL.) 30 tablet 0  . montelukast (SINGULAIR) 10 MG tablet TAKE 1 TABLET BY MOUTH EVERY DAY 90 tablet 0  . Multiple Vitamin (MULTIVITAMIN) tablet Take 1 tablet by mouth daily.    . Naproxen Sodium (ALEVE) 220 MG CAPS Take 1-2 capsules by mouth as needed.    Marland Kitchen omeprazole (PRILOSEC) 20 MG capsule Take 1 capsule (20 mg total) by mouth daily. (Patient taking differently: Take 10 mg by mouth daily. Patient takes 10 mg) 30 capsule 5  . pravastatin (PRAVACHOL) 40 MG tablet Take 40 mg by mouth daily.    . predniSONE (DELTASONE) 10 MG tablet Take 1 tablet (10 mg total) by mouth daily with breakfast. 30 tablet 5  . Probiotic Product (PROBIOTIC ADVANCED PO) Take 2 capsules by mouth daily.     . Turmeric 500 MG CAPS Take 1 capsule by mouth daily.    Marland Kitchen doxycycline (VIBRA-TABS) 100 MG tablet Take 1 tablet (100 mg total) by mouth 2 (two) times daily.  (Patient not taking: Reported on 10/31/2018) 8 tablet 0   No facility-administered medications prior to visit.      Review of Systems  Constitutional:   No  weight loss, night sweats,  Fevers, chills,  +fatigue, or  lassitude.  HEENT:   No headaches,  Difficulty swallowing,  Tooth/dental problems, or  Sore throat,                No sneezing, itching, ear ache,  +nasal congestion, post nasal drip,   CV:  No chest pain,  Orthopnea, PND, swelling in lower extremities,  anasarca, dizziness, palpitations, syncope.   GI  No heartburn, indigestion, abdominal pain, nausea, vomiting, diarrhea, change in bowel habits, loss of appetite, bloody stools.   Resp:    No chest wall deformity  Skin: no rash or lesions.  GU: no dysuria, change in color of urine, no urgency or frequency.  No flank pain, no hematuria   MS:  No joint pain or swelling.  No decreased range of motion.  No back pain.    Physical Exam  BP 120/60 (BP Location: Right Arm, Cuff Size: Normal)   Pulse (!) 59   Temp 98.9 F (37.2 C) (Oral)   Ht 4' 11.75" (1.518 m)   Wt 148 lb (67.1 kg)   SpO2 96%   BMI 29.15 kg/m   GEN: A/Ox3; pleasant , NAD, elderly    HEENT:  [place]/AT,  EACs-clear, TMs-wnl, NOSE-clear, THROAT-clear, no lesions, no postnasal drip or exudate noted.   NECK:  Supple w/ fair ROM; no JVD; normal carotid impulses w/o bruits; no thyromegaly or nodules palpated; no lymphadenopathy.    RESP  BB crackles  no accessory muscle use, no dullness to percussion  CARD:  RRR, no m/r/g, no peripheral edema, pulses intact, no cyanosis or clubbing.  GI:   Soft & nt; nml bowel sounds; no organomegaly or masses detected.   Musco: Warm bil, no deformities or joint swelling noted.   Neuro: alert, no focal deficits noted.    Skin: Warm, no lesions or rashes    Lab Results:   BNP No results found for: BNP  ProBNP No results found for: PROBNP  Imaging: No results found.    PFT Results Latest Ref Rng &  Units 08/16/2018 04/13/2018 01/11/2018 01/25/2017 04/29/2016 12/16/2015 02/26/2015  FVC-Pre L 1.65 1.35 1.61 1.69 - 1.42 1.84  FVC-Predicted Pre % 67 55 62 65 67 56 72  FVC-Post L - - - - 1.78 1.54 1.84  FVC-Predicted Post % - - - - 67 61 72  Pre FEV1/FVC % % 90 93 91 90 88 90 83  Post FEV1/FCV % % - - - - 90 90 84  FEV1-Pre L 1.49 1.26 1.46 1.53 1.55 1.28 1.52  FEV1-Predicted Pre % 81 68 75 77 77 66 78  FEV1-Post L - - - - 1.61 1.39 1.55  DLCO UNC% % 52 57 56 55 64 57 31  DLCO COR %Predicted % 88 104 95 92 104 105 84  TLC L - - - - 2.98 2.86 3.78  TLC % Predicted % - - - - 66 66 87  RV % Predicted % - - - - 67 75 97    Lab Results  Component Value Date   NITRICOXIDE 10 01/13/2016        Assessment & Plan:   Acute bronchitis Slow to resolve flare, improving with abx and steroids   Plan  Patient Instructions  Finish Doxycycline as discussed.  Taper Prednisone as discussed. -to Prednisone 75m daily .  Mucinex Twice daily  As needed   Delsym 2 tsp Twice daily  As needed  Cough.  Follow up with Dr. RChase Caller6-8 weeks and As needed   Please contact office for sooner follow up if symptoms do not improve or worsen or seek emergency care.       ILD (interstitial lung disease) Hypersensitive pneumonitis  - flare with bronchitis   Plan  Patient Instructions  Finish Doxycycline as discussed.  Taper Prednisone as discussed. -to Prednisone 127mdaily .  Mucinex Twice daily  As needed   Delsym 2 tsp Twice daily  As needed  Cough.  Follow up with Dr. Chase Caller 6-8 weeks and As needed   Please contact office for sooner follow up if symptoms do not improve or worsen or seek emergency care.          Rexene Edison, NP 10/31/2018

## 2018-10-31 NOTE — Telephone Encounter (Signed)
Pt is calling back [phone]

## 2018-10-31 NOTE — Telephone Encounter (Signed)
lmtcb x1 for pt. 

## 2018-10-31 NOTE — Assessment & Plan Note (Signed)
Hypersensitive pneumonitis  - flare with bronchitis   Plan  Patient Instructions  Finish Doxycycline as discussed.  Taper Prednisone as discussed. -to Prednisone 10mg  daily .  Mucinex Twice daily  As needed   Delsym 2 tsp Twice daily  As needed  Cough.  Follow up with Dr. Chase Caller 6-8 weeks and As needed   Please contact office for sooner follow up if symptoms do not improve or worsen or seek emergency care.

## 2018-10-31 NOTE — Telephone Encounter (Signed)
Rhonda [name] contacted me earlier this weekend 2d ago, Abx not working. AHving green sputum. Advised her to prednisone 60mg  per day Saturday and Sunday but come in today 10/31/2018  Monday for acute - please give her appt with either TP or Beth (BW). Might need change in abx +/- sputum culture and 2 week prednisone taper

## 2018-10-31 NOTE — Telephone Encounter (Signed)
Patient returned call, scheduled for today with TP.  No call back needed.

## 2018-11-01 MED ORDER — LEVALBUTEROL HCL 0.63 MG/3ML IN NEBU
0.6300 mg | INHALATION_SOLUTION | Freq: Once | RESPIRATORY_TRACT | Status: AC
  Administered 2018-10-31: 0.63 mg via RESPIRATORY_TRACT

## 2018-11-01 NOTE — Addendum Note (Signed)
Addended by: Parke Poisson E on: 11/01/2018 09:47 AM   Modules accepted: Orders

## 2018-11-07 ENCOUNTER — Telehealth: Payer: Self-pay | Admitting: Internal Medicine

## 2018-11-07 MED ORDER — PREDNISONE 10 MG PO TABS: 10.0000 mg | ORAL_TABLET | Freq: Every day | ORAL | 0 refills | Status: DC

## 2018-11-07 NOTE — Telephone Encounter (Signed)
Called and spoke with patient, medication has been sent in. Nothing further needed.

## 2018-11-07 NOTE — Telephone Encounter (Signed)
Spoke with patient while in the lobby, she stated that she is out of her prednisone and needs more. She was to take double of what she had for a "boost" prescription and now she is to go back down to the 10mg . TP please advise if I can send in a 90 day supply of the 10 mg tablet. Patient is requesting 90 days. Thank you.

## 2018-11-07 NOTE — Telephone Encounter (Signed)
That is fine to refill her prednisone 10mg  daily #90- , no refills.  Make sure she has a follow up ov with Dr. Chase Caller in 6 weeks , should have been made last week.  Please contact office for sooner follow up if symptoms do not improve or worsen or seek emergency care

## 2018-11-25 ENCOUNTER — Other Ambulatory Visit: Payer: Self-pay | Admitting: Cardiology

## 2018-12-06 ENCOUNTER — Encounter: Payer: Self-pay | Admitting: Cardiology

## 2018-12-06 ENCOUNTER — Ambulatory Visit (INDEPENDENT_AMBULATORY_CARE_PROVIDER_SITE_OTHER): Payer: Medicare Other | Admitting: Medical

## 2018-12-06 VITALS — BP 120/64 | HR 74 | Ht 59.0 in | Wt 150.4 lb

## 2018-12-06 DIAGNOSIS — Z01812 Encounter for preprocedural laboratory examination: Secondary | ICD-10-CM

## 2018-12-06 DIAGNOSIS — I493 Ventricular premature depolarization: Secondary | ICD-10-CM | POA: Diagnosis not present

## 2018-12-06 DIAGNOSIS — R079 Chest pain, unspecified: Secondary | ICD-10-CM

## 2018-12-06 DIAGNOSIS — E785 Hyperlipidemia, unspecified: Secondary | ICD-10-CM | POA: Diagnosis not present

## 2018-12-06 DIAGNOSIS — I2510 Atherosclerotic heart disease of native coronary artery without angina pectoris: Secondary | ICD-10-CM

## 2018-12-06 MED ORDER — NITROGLYCERIN 0.4 MG SL SUBL: 0.4000 mg | SUBLINGUAL_TABLET | SUBLINGUAL | 12 refills | Status: DC | PRN

## 2018-12-06 MED ORDER — METOPROLOL TARTRATE 50 MG PO TABS: 50.0000 mg | ORAL_TABLET | Freq: Once | ORAL | 0 refills | Status: DC

## 2018-12-06 MED ORDER — ASPIRIN EC 81 MG PO TBEC: 81.0000 mg | DELAYED_RELEASE_TABLET | Freq: Every day | ORAL | 3 refills | Status: DC

## 2018-12-06 MED ORDER — EZETIMIBE 10 MG PO TABS: 10.0000 mg | ORAL_TABLET | Freq: Every day | ORAL | 3 refills | Status: DC

## 2018-12-06 NOTE — Patient Instructions (Addendum)
Medication Instructions:  START Aspirin 81 mg DAILY TAKE NTG 0.4 mg AS NEEDED FOR PAIN (UP TO 3 PILLS 5 MINUTES APART)  If you need a refill on your cardiac medications before your next appointment, please call your pharmacy.   Lab work:THURSDAY 1/16 (ANYTIME   7:30am-4pm) CMET HgA1C FLP  If you have labs (blood work) drawn today and your tests are completely normal, you will receive your results only by: Marland Kitchen MyChart Message (if you have MyChart) OR . A paper copy in the mail If you have any lab test that is abnormal or we need to change your treatment, we will call you to review the results.  Testing/Procedures:  CORONARY CTA  Follow-Up: At [facility], you and your health needs are our priority.  As part of our continuing mission to provide you with exceptional heart care, we have created designated Provider Care Teams.  These Care Teams include your primary Cardiologist (physician) and Advanced Practice Providers (APPs -  Physician Assistants and Nurse Practitioners) who all work together to provide you with the care you need, when you need it. You will need a follow up appointment in 1 MONTH WITH APP.  Please call our office 2 months in advance to schedule this appointment.  You may see  or one of the following Advanced Practice Providers on your designated Care Team:   Lyda Jester, PA-C Melina Copa, PA-C . Ermalinda Barrios, PA-C       Any Other Special Instructions Will Be Listed Below (If Applicable). Please arrive at the [facility] main entrance of [facility] at ________ AM (30-45 minutes prior to test start time)  [facility] [address] [place], [place] [address] [phone]  Proceed to the [facility] Radiology Department (First Floor).  Please follow these instructions carefully (unless otherwise directed):   On the Night Before the Test: . Be sure to Drink plenty of water. . Do not consume any caffeinated/decaffeinated beverages  or chocolate 12 hours prior to your test. . Do not take any antihistamines 12 hours prior to your test.   On the Day of the Test: . Drink plenty of water. Do not drink any water within one hour of the test. . Do not eat any food 4 hours prior to the test. . You may take your regular medications prior to the test.  . Take metoprolol (Lopressor) 50 mg two hours prior to test.        After the Test: . Drink plenty of water. . After receiving IV contrast, you may experience a mild flushed feeling. This is normal. . On occasion, you may experience a mild rash up to 24 hours after the test. This is not dangerous. If this occurs, you can take Benadryl 25 mg and increase your fluid intake. . If you experience trouble breathing, this can be serious. If it is severe call 911 IMMEDIATELY. If it is mild, please call our office. . If you take any of these medications: Glipizide/Metformin, Avandament, Glucavance, please do not take 48 hours after completing test.

## 2018-12-06 NOTE — Progress Notes (Signed)
Cardiology Office Note   Date:  12/06/2018   ID:  Rhonda [name], DOB July 19, 1951, MRN [id number]  PCP:  Hayden Rasmussen, MD  Cardiologist:  No primary care provider on file. EP: None  Chief Complaint  Patient presents with  . Follow-up    coronary artery calcifications and HLD      History of Present Illness: Rhonda [name] is a 68 y.o. female with a PMH of coronary artery calcifications on CT scan with an elevated calcium score, HLD, palpitations, pulmonary fibrosis, and GERD, who presents for routine follow-up of her coronary artery calcifications and dyslipidemia.   She was last seen by cardiology, Dr. Radford Pax, 07/2017 at which time she was without cardiac complaints. She reported SOB only when going up stairs or up a hill. No chest pain complaints at that time. She had been noted to have multivessel coronary artery calcifications on high resolution CT Chest. She was recommended to undergo a CT Chest for calcium score and a NST to rule out ischemia at that time. Calcium score was elevated to 343, placing her in the 90th percentile for age/sex. Her NST revealed no ischemia. Echo at that time revelaed EF 55-60%, G1DD, and no wall motion abnormalities, and PA peak pressure of 21 mmHg (improved from previous). Given above findings, she was referred to the lipid clinic for better management of lipids and was started on zetia at that time. She has not had her cholesterol checked since then.   She returns today for routine follow-up. She reports doing well overall. She continues to swim and walk without experiencing exertional chest pain. That being said, she reports for the past several weeks/months she has been experiencing substernal chest tightness mostly in the evening 2-3 times per week. Chest pain is without associated symptoms and no significant aggravating factors. She reports pain resolves spontaneously after 3-5 minutes. She reports her breathing has been fairly stable over the  past year. She continues to have a chronic productive cough. She denies palpitations, dizziness, lightheadedness, syncope, orthopnea, PND, or LE edema.   Past Medical History:  Diagnosis Date  . Allergy   . Arthritis    history spinal stenosis. osteoarthritis right hip  . Asthma   . Cataract   . Coronary artery calcification seen on CAT scan 08/19/2017   >300 on CT scan 08/2017  . Depression   . DOE (dyspnea on exertion)    a. 04/2010 Lexi MV EF 71%, no ischemia/infarct;    . GERD (gastroesophageal reflux disease)   . H/O steroid therapy    Steroid use orally over 4 yrs- for Lung Fibrosis  . Heart palpitations 02/28/2015  . Helicobacter pylori ab+   . Hiatal hernia   . High cholesterol   . History of chronic bronchitis    as child  . Hyperlipidemia, mixed 08/19/2017  . Hyperplastic colon polyp 2007  . Inguinal hernia    right  . Insulin resistance    past  . Interstitial lung disease ([place])   . MVP (mitral valve prolapse)    Posterior mitral valve leaflet with mild MR  . Pneumonitis, hypersensitivity ([place])    a. 09/2012 s/p Bx - ? 2/2 bird, mold, oil paint exposure ->on steroids, followed by pulm.  . Pulmonary fibrosis ([facility])    Dr. Chase Caller follows- stable at present  . Pulmonary HTN ([place])    mild PASP 63mHg on echo 02/2015  . PVC (premature ventricular contraction) 08/19/2017  . Rapid heart rate  Dr. Radford Pax follows- last visit Epic note 9'16    Past Surgical History:  Procedure Laterality Date  . [place] STUDY N/A 02/21/2018   Procedure: [place] STUDY;  Surgeon: Mauri Pole, MD;  Location: [facility];  Service: Endoscopy;  Laterality: N/A;  . BREAST BIOPSY Right 2009   BIOPSY  . BREAST BIOPSY Left 2003   Benign   . CATARACT EXTRACTION Left   . CESAREAN SECTION    . ESOPHAGEAL MANOMETRY N/A 02/21/2018   Procedure: ESOPHAGEAL MANOMETRY (EM);  Surgeon: Mauri Pole, MD;  Location: [facility];  Service: Endoscopy;  Laterality: N/A;  . FOOT  FRACTURE SURGERY  2006 or 2007   right  . HYMENECTOMY    . LUNG BIOPSY  09/28/2012   Procedure: LUNG BIOPSY;  Surgeon: Melrose Nakayama, MD;  Location: [place];  Service: Thoracic;  Laterality: N/A;  lung biopsies tims three  . SQUAMOUS CELL CARCINOMA EXCISION Left    left arm  . TOTAL HIP ARTHROPLASTY Right 09/10/2015   Procedure: RIGHT TOTAL HIP ARTHROPLASTY ANTERIOR APPROACH;  Surgeon: Paralee Cancel, MD;  Location: [facility];  Service: Orthopedics;  Laterality: Right;  . TUBAL LIGATION    . VIDEO ASSISTED THORACOSCOPY  09/28/2012   Procedure: VIDEO ASSISTED THORACOSCOPY;  Surgeon: Melrose Nakayama, MD;  Location: [place];  Service: Thoracic;  Laterality: Right;  Marland Kitchen VIDEO BRONCHOSCOPY  11/19/2011   Procedure: VIDEO BRONCHOSCOPY WITH FLUORO;  Surgeon: Brand Males, MD;  Location[facility];  Service: Endoscopy;;     Current Outpatient Medications  Medication Sig Dispense Refill  . acetaminophen (TYLENOL) 325 MG tablet Take 650 mg by mouth every 6 (six) hours as needed. Reported on 02/10/2016    . albuterol (PROAIR HFA) 108 (90 Base) MCG/ACT inhaler Inhale 2 puffs into the lungs every 4 (four) hours as needed for wheezing. Or coughing spells.  You may use 2 Puffs 5-10 minutes before exercise. 1 Inhaler 3  . Alpha-D-Galactosidase (BEANO PO) Take 1 tablet by mouth as needed (if eating onion or garlic).     . AMBULATORY NON FORMULARY MEDICATION Take 2 capsules by mouth daily. Cinnsulin cinnamon caplsule 1 by mouth daily     . Ascorbic Acid (VITAMIN C) 1000 MG tablet Take 1,000 mg by mouth daily.    . Calcium-Vitamin D-Vitamin K (VIACTIV) 938-182-99 MG-UNT-MCG CHEW Chew 2 tablets by mouth daily.     . chlorpheniramine (CHLOR-TRIMETON) 4 MG tablet Take 4 mg by mouth daily as needed for allergies.     Marland Kitchen dicyclomine (BENTYL) 10 MG capsule Take 1 capsule (10 mg total) by mouth 2 (two) times daily as needed for spasms. 60 capsule 1  . ezetimibe (ZETIA) 10 MG tablet Take 1 tablet (10 mg total)  by mouth daily. Please keep upcoming appt for future refills. Thank you. 90 tablet 3  . fluticasone (FLONASE) 50 MCG/ACT nasal spray SPRAY 2 SPRAYS INTO EACH NOSTRIL EVERY DAY 1 g 12  . fluticasone furoate-vilanterol (BREO ELLIPTA) 100-25 MCG/INH AEPB TAKE 1 PUFF BY MOUTH EVERY DAY 180 each 1  . metoprolol succinate (TOPROL-XL) 50 MG 24 hr tablet Take one half tablet by mouth daily with or immediately following a meal.    . montelukast (SINGULAIR) 10 MG tablet TAKE 1 TABLET BY MOUTH EVERY DAY 90 tablet 0  . Multiple Vitamin (MULTIVITAMIN) tablet Take 1 tablet by mouth daily.    . Naproxen Sodium (ALEVE) 220 MG CAPS Take 1-2 capsules by mouth as needed.    Marland Kitchen  omeprazole (PRILOSEC OTC) 20 MG tablet Take 10 mg by mouth daily.    Marland Kitchen omeprazole (PRILOSEC) 20 MG capsule Take 1 capsule (20 mg total) by mouth daily. (Patient taking differently: Take 10 mg by mouth daily. Patient takes 10 mg) 30 capsule 5  . pravastatin (PRAVACHOL) 40 MG tablet Take 40 mg by mouth daily.    . predniSONE (DELTASONE) 10 MG tablet Take 1 tablet (10 mg total) by mouth daily with breakfast. 90 tablet 0  . Probiotic Product (PROBIOTIC ADVANCED PO) Take 2 capsules by mouth daily.     . Turmeric 500 MG CAPS Take 1 capsule by mouth daily.    Marland Kitchen aspirin EC 81 MG tablet Take 1 tablet (81 mg total) by mouth daily. 90 tablet 3  . metoprolol tartrate (LOPRESSOR) 50 MG tablet Take 1 tablet (50 mg total) by mouth once for 1 dose. 2 tablet 0  . nitroGLYCERIN (NITROSTAT) 0.4 MG SL tablet Place 1 tablet (0.4 mg total) under the tongue every 5 (five) minutes as needed for chest pain. 25 tablet 12   No current facility-administered medications for this visit.     Allergies:   Atorvastatin; Betadine [povidone iodine]; Codeine; Garlic; Onion; Rosuvastatin; Shellfish allergy; and Sulfonamide derivatives    Social History:  The patient  reports that she has never smoked. She has never used smokeless tobacco. She reports current alcohol use of  about 2.0 standard drinks of alcohol per week. She reports that she does not use drugs.   Family History:  The patient's family history includes Allergic rhinitis in her daughter; Asthma in her maternal grandmother and mother; Bone cancer in her paternal grandfather; Breast cancer in her cousin and paternal aunt; Dementia (age of onset: 41) in her mother; Diabetes in her father; Emphysema in her maternal grandmother; Heart disease in her sister; Hypertension in her sister; Kidney disease in her sister; Lung disease in her maternal grandfather; Lymphoma in her father; Osteoarthritis in her mother; Ovarian cancer in her maternal aunt.    ROS:  Please see the history of present illness.   Otherwise, review of systems are positive for none.   All other systems are reviewed and negative.    PHYSICAL EXAM: VS:  BP 120/64   Pulse 74   Ht '4\' 11"'$  (1.499 m)   Wt 150 lb 6.4 oz (68.2 kg)   SpO2 95%   BMI 30.38 kg/m  , BMI Body mass index is 30.38 kg/m. GEN: Well nourished, well developed, in no acute distress HEENT: sclera anicteric Neck: no JVD, carotid bruits, or masses Cardiac: RRR; no murmurs, rubs, or gallops,no edema  Respiratory:  clear to auscultation bilaterally, normal work of breathing GI: soft, nontender, nondistended, + BS MS: no deformity or atrophy Skin: warm and dry, no rash Neuro:  Strength and sensation are intact Psych: euthymic mood, full affect   EKG:  EKG is ordered today. The ekg ordered today demonstrates NSR with rate 72, with non-specific T wave abnormalities, no STE/D.    Recent Labs: No results found for requested labs within last 8760 hours.    Lipid Panel    Component Value Date/Time   CHOL 180 11/08/2017 0921   TRIG 158 (H) 11/08/2017 0921   HDL 71 11/08/2017 0921   CHOLHDL 2.5 11/08/2017 0921   CHOLHDL 3.5 02/26/2015 1540   VLDL 37 02/26/2015 1540   LDLCALC 77 11/08/2017 0921      Wt Readings from Last 3 Encounters:  12/06/18 150 lb 6.4 oz (68.2  kg)  10/31/18 148 lb (67.1 kg)  08/16/18 144 lb (65.3 kg)      Other studies Reviewed: Additional studies/ records that were reviewed today include:   Echocardiogram 08/2017: Study Conclusions  - Left ventricle: The cavity size was normal. Wall thickness was   increased in a pattern of mild LVH. Systolic function was normal.   The estimated ejection fraction was in the range of 55% to 60%.   Wall motion was normal; there were no regional wall motion   abnormalities. Doppler parameters are consistent with abnormal   left ventricular relaxation (grade 1 diastolic dysfunction). The   E/e&' ratio is between 8-15, suggesting indeterminate LV filling   pressure. - Mitral valve: Mildly thickened leaflets . There was trivial   regurgitation. - Left atrium: The atrium was normal in size. - Tricuspid valve: There was trivial regurgitation. - Pulmonary arteries: PA peak pressure: 21 mm Hg (S). - Inferior vena cava: The vessel was normal in size. The   respirophasic diameter changes were in the normal range (>= 50%),   consistent with normal central venous pressure.  Impressions:  - Compared to a prior study in 2016, there are no significant   changes. There is trivial MR - the posterior mitral valve leaflet   was not well-visualized to determine if prolapse was present.  NST 08/2017:  Nuclear stress EF: 76%.  There was accentuation of the resting ST/T wave abnormality during infusion.  The study is normal.  This is a low risk study.  The left ventricular ejection fraction is hyperdynamic (>65%).    ASSESSMENT AND PLAN:   1. Chest pain in patient with multivessel coronary artery calcifications on prior CT Chest: somewhat atypical chest pain which occur 2-3 days per week in the evenings, substernal, pressure, no other associated symptoms, and resolves spontaneously after 3-5 minutes. CT Chest for calcium score 08/2017 showed elevated score of 343, placing her in the 90th  percentile for age/sex. NST at that time was negative for ischemia. Echo with EF 55-60%. Risk factors for CAD include HLD and pre-diabetes (as of 2016 without repeat testing and no treatment).   - Will check a coronary CTA to further evaluate chest pain - Will check FLP and Hgb A1C for risk stratification - Recommended starting aspirin '81mg'$  daily given known coronary artery calcifications - Will prescribe prn SL nitro in the event of a severe episode of chest pain - patient educated on administration instructions - Continue statin and zetia  2. HLD: patient has prior intolerance to lipitor and crestor, as well as higher doses of pravastatin. Given elevated calcium score she was sent to the lipid clinic and started on zetia in addition to her pravastatin '40mg'$  daily. Repeat lipids 10/2017 showed improvement in LDL to 77 and Triglycerides 158. No repeat labs since that time.  - Patient will return for fasting blood work on Thursday  - Continue pravastatin and zetia at current dose - if cholesterol is poorly controlled, may refer back to the lipid clinic for PSK-9 inhibitor consideration  3. PVCs: no complaints of palpitations. She has been taking metoprolol succinate '25mg'$  daily with good control - Continue metoprolol succinate '25mg'$  daily  Plan discussed with Dr. Acie Fredrickson who was in agreement with the plan.    Current medicines are reviewed at length with the patient today.  The patient does not have concerns regarding medicines.  The following changes have been made:  Start aspirin '81mg'$  daily. Prescription sent for prn SL nitro  Labs/  tests ordered today include:   Orders Placed This Encounter  Procedures  . CT CORONARY MORPH W/CTA COR W/SCORE W/CA W/CM &/OR WO/CM  . CT CORONARY FRACTIONAL FLOW RESERVE DATA PREP  . CT CORONARY FRACTIONAL FLOW RESERVE FLUID ANALYSIS  . Comp Met (CMET)  . Hemoglobin A1c  . Lipid Profile  . EKG 12-Lead     Disposition:   FU with an APP on Dr. Theodosia Blender team  in 1 month  Signed, Abigail Butts, PA-C  12/06/2018 5:10 PM

## 2018-12-08 ENCOUNTER — Other Ambulatory Visit: Payer: Self-pay | Admitting: Family Medicine

## 2018-12-08 ENCOUNTER — Other Ambulatory Visit: Payer: Medicare Other | Admitting: *Deleted

## 2018-12-08 DIAGNOSIS — Z01812 Encounter for preprocedural laboratory examination: Secondary | ICD-10-CM

## 2018-12-08 DIAGNOSIS — Z1231 Encounter for screening mammogram for malignant neoplasm of breast: Secondary | ICD-10-CM

## 2018-12-08 DIAGNOSIS — I493 Ventricular premature depolarization: Secondary | ICD-10-CM

## 2018-12-08 DIAGNOSIS — E785 Hyperlipidemia, unspecified: Secondary | ICD-10-CM | POA: Diagnosis not present

## 2018-12-09 ENCOUNTER — Other Ambulatory Visit: Payer: Self-pay | Admitting: Family Medicine

## 2018-12-09 LAB — COMPREHENSIVE METABOLIC PANEL
ALK PHOS: 58 IU/L (ref 39–117)
ALT: 14 IU/L (ref 0–32)
AST: 18 IU/L (ref 0–40)
Albumin/Globulin Ratio: 1.5 (ref 1.2–2.2)
Albumin: 3.9 g/dL (ref 3.6–4.8)
BILIRUBIN TOTAL: 0.4 mg/dL (ref 0.0–1.2)
BUN / CREAT RATIO: 20 (ref 12–28)
BUN: 18 mg/dL (ref 8–27)
CHLORIDE: 103 mmol/L (ref 96–106)
CO2: 24 mmol/L (ref 20–29)
Calcium: 9.3 mg/dL (ref 8.7–10.3)
Creatinine, Ser: 0.88 mg/dL (ref 0.57–1.00)
GFR calc Af Amer: 78 mL/min/{1.73_m2} (ref >59)
GFR calc non Af Amer: 67 mL/min/{1.73_m2} (ref >59)
GLUCOSE: 88 mg/dL (ref 65–99)
Globulin, Total: 2.6 g/dL (ref 1.5–4.5)
Potassium: 3.9 mmol/L (ref 3.5–5.2)
Sodium: 142 mmol/L (ref 134–144)
Total Protein: 6.5 g/dL (ref 6.0–8.5)

## 2018-12-09 LAB — LIPID PANEL
CHOL/HDL RATIO: 2.6 ratio (ref 0.0–4.4)
Cholesterol, Total: 177 mg/dL (ref 100–199)
HDL: 68 mg/dL (ref >39)
LDL CALC: 83 mg/dL (ref 0–99)
Triglycerides: 131 mg/dL (ref 0–149)
VLDL Cholesterol Cal: 26 mg/dL (ref 5–40)

## 2018-12-09 LAB — HEMOGLOBIN A1C
ESTIMATED AVERAGE GLUCOSE: 126 mg/dL
Hgb A1c MFr Bld: 6 % — ABNORMAL HIGH (ref 4.8–5.6)

## 2018-12-15 ENCOUNTER — Ambulatory Visit (INDEPENDENT_AMBULATORY_CARE_PROVIDER_SITE_OTHER): Payer: Medicare Other | Admitting: Internal Medicine

## 2018-12-15 ENCOUNTER — Encounter: Payer: Self-pay | Admitting: Internal Medicine

## 2018-12-15 VITALS — BP 142/70 | HR 50 | Ht 59.0 in | Wt 148.0 lb

## 2018-12-15 DIAGNOSIS — J849 Interstitial pulmonary disease, unspecified: Secondary | ICD-10-CM

## 2018-12-15 DIAGNOSIS — I2510 Atherosclerotic heart disease of native coronary artery without angina pectoris: Secondary | ICD-10-CM

## 2018-12-15 DIAGNOSIS — J679 Hypersensitivity pneumonitis due to unspecified organic dust: Secondary | ICD-10-CM

## 2018-12-15 DIAGNOSIS — R0789 Other chest pain: Secondary | ICD-10-CM | POA: Diagnosis not present

## 2018-12-15 DIAGNOSIS — Z006 Encounter for examination for normal comparison and control in clinical research program: Secondary | ICD-10-CM

## 2018-12-15 MED ORDER — GABAPENTIN 300 MG PO CAPS: 300.0000 mg | ORAL_CAPSULE | Freq: Every day | ORAL | 5 refills | Status: DC

## 2018-12-15 NOTE — Research (Signed)
  Title: Chronic Fibrosing Interstitial Lung Disease with Progressive Phenotype Prospective Outcomes (ILD-PRO) Registry ( ClinicalTrials.gov identifier [id number])  Protocol for  12/15/2018  is Version June 21 2017 Consent for  12/15/2018  is Version   [employer] IRB Approved version [id number] revised [id number]    Synopsis: To collect data & biological samples to support future research in chronic fibrosing ILDs. Registry will describe current approaches to diagnosis & treatment of chronic fibrosing ILDs, analyze participant characteristics to describe the history of disease, assess QOL from self-administered questionnaires, describe participant interactions with health care systems, describe other ILD treatment practices across institutions, and utilize biological samples linked to well characterized other ILD participants to ID disease biomarkers.   Clinical Research Coordinator note : This visit for Subject Rhonda [name] with DOB: 26-Apr-1951 on 12/15/2018 for the above protocol is Visit/Encounter 6 month follow up and is for purpose of research . The consent for this encounter is currently IRB approved. The subject expressed continued interest and consent in continuing as a study subject. Subject confirmed that there was  NO change in contact information (e.g. address, telephone, email). Subject thanked for participation in research and contribution to science.   In this visit 12/15/2018 the subject was given the Patient reported outcomes questionnaires packet and completed them in its entirety. The subject also had blood collection drawn as per above referenced protocol. The subject will return to clinic in approximately 6 months for her next interval visit. For further details regarding today's visit please reference the subejcts paper source  binder.  Signed by  T. Early Chars BS, [place] Coordinator  [place], [place] 2:[address] [place] 12/15/2018

## 2018-12-15 NOTE — Patient Instructions (Addendum)
ICD-10-CM   1. ILD (interstitial lung disease) ([place]) J84.9   2. Hypersensitivity pneumonitis ([place]) J67.9   3. Right-sided chest wall pain R07.89   ILD (interstitial lung disease) ([place]) Hypersensitivity pneumonitis ([facility])  - clinically stable since last visit - clinically better after recent flare up  Plan  - continue prednisone 5mg  per day  -Good idea to start nintedanib once we have insurance approval (we will start with 100 mg lower dose taking 1 tablet a day for 1 month and then escalate 100 mg twice daily]  -This is an anti-fibrotic -Do repeat spirometry and DLCO in April 2020   Right-sided chest wall pain -getting worse  -I suspect this is neuropathic pain following surgical lung biopsy many years ago -Unclear why this is getting worse -Ideally would need a CT scan of the chest but will check with radiologist if CT coronary angiogram of the chest can become issues in the right lung field -Meanwhile we will start gabapentin 300 mg once daily at night  -Watch out for any fatigue or drowsiness  Follow-up -April 2020 in ILD clinic but after spirometry and DLCO

## 2018-12-15 NOTE — Progress Notes (Signed)
#GE reflux with small hiatal hernia  - on ppi   #History of rapid heart rate not otherwise specified  - Start her on Lopressor  2008/2009 by primary care physician. History appears to correlate with onset of respiratory issues  - refuses to dc this drug as of 2014 discussion   # Hypersensitivity Pneumonitis and ILD  - Potential etiologies - cockateil x 2 for 18 years through end 2012. 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 Denies mold but lives in house built in [place] with a "weird baselment: and has humidifier  - first noted on CT chest 10/15/09 following trip to [place] (PE negative) but not described in 2003 CT chest report  - autoimmune panel: 10/23/11: Negative  -  Uderwennt bronch 11/19/11  - non diagnostic  - VATS Nov 2013 (done after initially refusing)- [place]. However, there is worrying trend of UIP pattern in the Upper lobes.            Oct/Nov 2012  March 2013 08/11/12  Nov/Dec 2013 Dx HP/UIP 01/02/2013  02/28/13 05/25/2013  12/06/2013  03/15/2014  06/29/2014  10'27/15 12/19/2014  02/26/15 01/13/2016  04/29/2016  10/23/2016  01/25/2017  06/17/2017 [facility] July 2018 01/11/2018  04/13/2018  08/16/2018   Symp/Signs Dyspnea x5y  New crackles  dimished dyspnea except at hill. Improved crackles.  Improved dyspnea except @ hill                 FVC  2.6 L    2.2 L/82%   2.1 L/80%   2.19 L/82%  2.2L/91% 2.1/81% 1.86/69% 1.9L/71% 1.86/75% 1.75L/65% 1.82/73% (dec 2015 offce spiro) 2.84L/72% 1.54/61% 1.78L/67%  1.69L/65%  1.61/62% 1.35/55% 1.65/67%  DLCO  10/50%    10/51%   9.6/51%     11.9/63% 11.7/62%  11.561%  5.5/31% 10.06/57% 11/04/63%  10.43/55% 8.56/50% 10.57/61% 10.16/57% 9.21/52%  Walk test 185 ft x 3 laps on RA  rest 92%. Pk exertion 90%. Pk HR 108 Rest 92%. Pk exertion 86% at 2.5 laps. Pk HR 102  pk exertion - 88% Rest 96%. Pk 95%. Pk HR is 75. Done at full dose  lopressor Rest 94%. Pk 91%. Rest HR 93 with pk HR 103/min. Done at half dose lopressor REst 96%. Pk pulse ox 92%. Rest HR 65. Pk HR 86 Did not desat. Normal Pk HR  Pulse ox 96%, droped to89% at 2nd lap but bounced up and ended at 91% 3rd lap Did not desat Did not desat, Pk HHR 93  ;llowest pulse ox 90% at 2nd lap but she held her breath, then 92% at 3rd lap 99% -> 91% at end of 3rd lap 94%, Dropped to 87% at 3rd lap, HR peak102/m 97%, dropped to 89% in 3rd lap - asymptomatic. HR 100/min No prob with 68md at [facility] 99% rest - -> dopped to 92% 3rd lap. Pkr HR 107/min 98% rest -> 91% 3rd lap with Pk HR 91/min   CT chest   unchanged          yes Ct mid dec possibly worse - unsure Ct - similar to April 2016 but worse since 2013   CT 06/24/17 - similar to June 2017     Tests/Bx Bronch 0- non diagnostic   VATS - HP with UIP in Upper lobe                   RX  Rx pred start 11/23/12 On  pred 79m per day and will reduce to 276mOn Pred 2080mer day On pred 51m34mr day On pred 5mg 48m day Rx pred 5mg d54my She reduced to pred 5mg x 19mt 1 month QOD due to GI concern side effects On pred 5mg qod3mt will increase to 5/d after this visit On pred 5mg dail11m On pred 5mg daily53mncrease pred to 51mg per d28meduce to pred 7.5 due to skin sissues  pred burs and reduce to 51mg per da66m                         OV 06/17/2017  Chief Complaint  Patient presents with  . Follow-up    [name]. Pt denies significant cough, denies CP/tightness, f/c/s.     She is better. Feels she does not need o2. Has been to [facility] for transplant clinic; felt too early. Seems higher dose prednisone helping but then she is also bettter t his time of the year. In May 2018 I was at [facility] and curbsided national thought leaders - feel that we could explore silent active GERD as a possibility of ILD getting wors with time.   OV 01/11/2018  Chief Complaint  Patient presents with  . Follow-up    PFT done today.  Pt  states she has been coughing x2 weeks since she returned from a trip to [place]. FroDelawareing, pt has had pain in ribs. Pt states she has mild SOB which is stable due to rehab.  Pt states that Dr. Richter is wDarron Doomto know if pt could possibly have eosinophilic esophagitis.   Went to [place]. RetDelawarend has been coughing x 2 weeks. Associated with this is some rib pain on right side. She feels she does not need o2. She feels she does not need pred/abx at this time. She also exposed to flu wit family   OV 02/08/2018  Chief Complaint  Patient presents with  . Acute Visit    CXR done 02/07/18.  Pt states her throat is sore but not as bad as it was yesterday when in office and does have some left ear pain. Pt states she has some pain on right rib cage and has increased fatigue.     Ms. Lebleu preForryutely.  I last saw her one month ago gave her Tamiflu for prophylaxis after flu exposure.  At that point in time she was already reducing her chronic stable prednisone of 10 mg for interstitial lung disease/hypersensitivity pneumonitis.  She had tapered herself slowly 3 or 4 weeks ago to 5 mg.  She says she was doing fairly okay but in the last 2 or 3 weeks she has had increased fatigue.  She says she goes daily swimming and then feels energized but then an hour later starts having fatigue which is more than usual.  Also in the daytime she has random fatigue.  In terms of her respiratory symptoms these are stable and in fact slightly better in terms of shortness of breath and cough but she is having sore throat without any fever and some left-sided otalgia.  In addition she is having right-sided chest pains that are more than usual.  The chest pains have been reported before.  These are specifically at localized spots along the previous incision several years ago for interstitial lung disease.  They are tender as well  to touch.  This pain is worse.  In addition she is also being bothered by arthralgia  in her hand and left hip.  The left hip arthralgias old with a hand arthralgias new.  She is worried about pneumothorax recurrence and so she had a chest x-ray yesterday that shows no pneumothorax.  She has stable interstitial lung disease changes but in my personal visualization these interstitial lung disease changes are worse compared to several years ago.  We do know that she has slowly worsening interstitial lung disease.  In terms of a walking desaturation test this shows stability since last visit.  She has seen GI for acid reflux and has pH probe study coming up  In talking to her she tells me she has been on metformin for over a month or 2.  This is new and is meant for pre-diabetes.  Apparently her hemoglobin A1c is always below 6 but is above 5.5.  Walking desaturation test on 02/08/2018 185 feet x 3 laps on ROOM AIR:  did walk normal pace with forehead probe desaturate. Rest pulse ox was 99%, final pulse ox was 93%. HR response 72/min at rest to 93/min at peak exertion. [name]  Did not Desaturate < 88% . Aretha Parrot yes did  Desaturated </= 3% points. Aretha Parrot yes did get tachyardic   Dg Chest 2 View  Result Date: 02/07/2018 CLINICAL DATA:  Chest wall pain for 2 weeks EXAM: CHEST - 2 VIEW COMPARISON:  CT 08/20/2017, radiograph 10/11/2015 FINDINGS: Coarse interstitial pattern consistent with pulmonary fibrosis, similar distribution compared to prior. No focal pulmonary opacity or pleural effusion. Stable cardiomediastinal silhouette with aortic atherosclerosis. No pneumothorax. Degenerative changes of the spine. IMPRESSION: No active cardiopulmonary disease. Similar appearance of diffuse pulmonary fibrosis. Negative for a pneumothorax. Electronically Signed   By: Donavan Foil M.D.   On: 02/07/2018 14:26    OV 04/13/2018  Chief Complaint  Patient presents with  . Follow-up    Pt had pre spiro and DLCO PFT prior to OV. Pt has increase of productive  cough-thick white in last week. Pt has some wheezing, and allergy issues.   Ms. Freda Munro presents for routine follow-up.  I saw her acutely just approximately 2 months ago.  Then end of April 2019 she saw a nurse practitioner again acutely and was given antibiotic and prednisone.  She says she felt better after that but in the last week or so due to increased pollen load in the community and also moving furniture because her daughter is relocating out of her home she started having more cough.  There is no change in her dyspnea with the cough is really bad.  This is despite Singulair and Chlor-Trimeton.  Those things to help but just take the edge off.  She feels that the pollen making the cough worse.  There is no change in dyspnea.  Walking desaturation test documented below his baseline.  She had pulmonary function test today but the [facility] shows significant decline compared to February 2019 and she feels surprised by this.  DLCO is baseline unchanged and a walking desaturation test limited in the office is also unchanged.  She does not have any fever but has significant postnasal drip.  She has not followed up at [facility] transplant clinic or the allergist recently.  OV 08/16/2018  Subjective:  Patient ID: Aretha Parrot, female , DOB: 01-08-1951 , age 27 y.o. , MRN: [id number] , ADDRESS: [address]  [place] [address]   08/16/2018 -   Chief Complaint  Patient presents with  . Follow-up    PFT performed today.  Pt states she has had a lot of mucus the month of September 2019 so she has been taking the Chlor-Trimeton. States other than that she has been able to do a lot more activities and has been swimming a lot more. Pt states she believes the SOB is stable.     HPI ARTHELIA CALLICOTT 68 y.o. -continues to do well.  She is taking higher dose of prednisone 10 mg/day.  Also the summer usually she is doing well.  She is not having much of a cough.  Lung function test shows improvement  compared to tests done earlier this year.  In fact she is almost as good as much 2018.  Still compared to 2014-2016 she has progressive lung disease.  Walking desaturation test is stable.  She needs a high-dose flu shot but we do not have stock today.  She has lost some weight intentionally with better dietary control.  She is exercising regularly swimming.  We discussed ofev  potential future therapy if the drug is approved for this indication.  We will know study results in a year or so.  She is open to the idea but is worried somewhat about her irritable bowel syndrome flaring up.  She is no longer following at the [facility] lung transplant clinic given stability lung function  Other issues: She always has right-sided chest wall pain to the site of lung biopsy.  The only way this is been resolved as by her limiting her use of bra .  Also her irritable bowel syndrome is under control but in case it flares up she wants a refill of dicyclomine     OV 12/15/2018  Subjective:  Patient ID: Aretha Parrot, female , DOB: May 16, 1951 , age 21 y.o. , MRN: [id number] , AD[address]ESS: [id number] [id number]   12/15/2018 -   Chief Complaint  Patient presents with  . Follow-up    Pt states she has been doing better since last visit with TP but states she has been having pain in her rib cage x2 years but states it has become more intense.     HPI BERLIN VIERECK 68 y.o. -returns for follow-up of her ILD due to hypersensitivity pneumonitis.  She did a clinic visit also for research with the ILD-pro registry study.  Since I last saw her in September 2019 she had a flareup and required higher doses of prednisone.  Since then she is returned to baseline of 5 mg prednisone and she feels good.  She is stable.  Walking desaturation test shows stability.  We have been discussing over the last few months about taking nintedanib based on new data and progressive non--IPF ILD's.  She has trepidation for  this because of irritable bowel syndrome.  After much discussion she is willing to try 100 mg once daily for a month and then escalate to 100 mg twice daily which is the therapeutic dose.  She will do this once after insurance approval which we suspect will happen over the next few months.  Her main issue is that her right sided infra-axillary chest pain is getting worse.  This started after her surgical lung biopsy and pneumothorax on the right side.  It is random and happens with twisting.  But now days it happens once every few days.  Previously it used to happen  once every few weeks.  Sometimes it is excruciating but then she is left in pain for a few days.  Applying Abrol sudden twisting of her body makes it worse.  Is a lancinating pain.  She did try gabapentin some years ago for chronic cough .  She did tolerate the gabapentin well.  She does not remember if it helped the pain or not but clearly back and the pain was much less intense.  She is willing to try this again.  I recommended a CT scan of the chest but she is going to have radiation for a CT coronary angiogram because of high levels of coronary artery calcification.  Therefore written to the radiologist to see if he can look at the lung parenchyma with a CT angiogram     Simple office walk 185 feet x  3 laps goal with forehead probe 04/13/2018  08/16/2018  12/15/2018   O2 used Room air Room air Room air  Number laps completed '3 3 3  ' Comments about pace good Moderate pace Normal, hip bothering  Resting Pulse Ox/HR 98% and 73/min 98% and HR 77/min 99% and HR 61/min  Final Pulse Ox/HR 91% and 91/min 93% and 92.min 94% and 92/min  Desaturated </= 88% no no no  Desaturated <= 3% points yes yes Yes, 5 points  Got Tachycardic >/= 90/min yes yes yes  Symptoms at end of test none none Hip pain and very mild dyspnea  Miscellaneous comments x x     Results for PERRY, MOLLA (MRN [id number]) as of 12/15/2018 14:20  Ref. Range 12/06/2013  13:03 03/15/2014 15:56 09/18/2014 11:41 02/26/2015 09:02 12/16/2015 16:31 04/29/2016 12:44 01/25/2017 10:16 01/11/2018 15:19 04/13/2018 08:47 08/16/2018 09:00  FVC-Pre Latest Units: L 1.86 1.91 1.73 1.84 1.42  1.69 1.61 1.35 1.65  FVC-%Pred-Pre Latest Units: % '69 71 64 72 56 67 65 62 55 67 '    ROS - per HPI     has a past medical history of Allergy, Arthritis, Asthma, Cataract, Coronary artery calcification seen on CAT scan (08/19/2017), Depression, DOE (dyspnea on exertion), GERD (gastroesophageal reflux disease), H/O steroid therapy, Heart palpitations (02/22/7061), Helicobacter pylori ab+, Hiatal hernia, High cholesterol, History of chronic bronchitis, Hyperlipidemia, mixed (08/19/2017), Hyperplastic colon polyp (2007), Inguinal hernia, Insulin resistance, Interstitial lung disease ([place]), MVP (mitral valve prolapse), Pneumonitis, hypersensitivity ([place]), Pulmonary fibrosis ([place]), Pulmonary HTN ([place]), PVC (premature ventricular contraction) (08/19/2017), and Rapid heart rate.   reports that she has never smoked. She has never used smokeless tobacco.  Past Surgical History:  Procedure Laterality Date  . [place] STUDY N/A 02/21/2018   Procedure: [place] STUDY;  Surgeon: Mauri Pole, MD;  Location: [facility];  Service: Endoscopy;  Laterality: N/A;  . BREAST BIOPSY Right 2009   BIOPSY  . BREAST BIOPSY Left 2003   Benign   . CATARACT EXTRACTION Left   . CESAREAN SECTION    . ESOPHAGEAL MANOMETRY N/A 02/21/2018   Procedure: ESOPHAGEAL MANOMETRY (EM);  Surgeon: Mauri Pole, MD;  Location: [facility];  Service: Endoscopy;  Laterality: N/A;  . FOOT FRACTURE SURGERY  2006 or 2007   right  . HYMENECTOMY    . LUNG BIOPSY  09/28/2012   Procedure: LUNG BIOPSY;  Surgeon: Melrose Nakayama, MD;  Location: [place];  Service: Thoracic;  Laterality: N/A;  lung biopsies tims three  . SQUAMOUS CELL CARCINOMA EXCISION Left    left arm  . TOTAL HIP ARTHROPLASTY Right 09/10/2015   Procedure: RIGHT  TOTAL HIP ARTHROPLASTY ANTERIOR APPROACH;  Surgeon: Paralee Cancel, MD;  Location: [facility];  Service: Orthopedics;  Laterality: Right;  . TUBAL LIGATION    . VIDEO ASSISTED THORACOSCOPY  09/28/2012   Procedure: VIDEO ASSISTED THORACOSCOPY;  Surgeon: Melrose Nakayama, MD;  Location: [place];  Service: Thoracic;  Laterality: Right;  Marland Kitchen VIDEO BRONCHOSCOPY  11/19/2011   Procedure: VIDEO BRONCHOSCOPY WITH FLUORO;  Surgeon: Brand Males, MD;  Location: [facility] ENDOSCOPY;  Service: Endoscopy;;    Allergies  Allergen Reactions  . Atorvastatin Other (See Comments)    Leg pain  . Betadine [Povidone Iodine] Other (See Comments)    blisters  . Codeine Nausea And Vomiting  . Garlic     Severe diarrhea per patient  . Onion     Severe diarrhea per patient  . Rosuvastatin Other (See Comments)    Leg pain  . Shellfish Allergy Other (See Comments)    vomiting  . Sulfonamide Derivatives Other (See Comments)    headaches    Immunization History  Administered Date(s) Administered  . Hepatitis A 05/23/2010  . Hepatitis B 06/23/2006  . Influenza Split 08/11/2012, 08/14/2017  . Influenza Whole 09/08/2011  . Influenza, High Dose Seasonal PF 08/24/2016, 07/24/2017, 08/31/2018  . Influenza,inj,Quad PF,6+ Mos 09/07/2013  . Influenza-Unspecified 09/26/2014, 07/27/2015  . MMR 05/23/2010  . Pneumococcal Conjugate-13 03/15/2014  . Pneumococcal Polysaccharide-23 01/11/2018  . Td 02/22/2003  . Tdap 10/28/2012  . Zoster 12/04/2010  . Zoster Recombinat (Shingrix) 02/15/2017, 05/17/2017    Family History  Problem Relation Age of Onset  . Emphysema Maternal Grandmother   . Asthma Maternal Grandmother   . Asthma Mother   . Osteoarthritis Mother   . Dementia Mother 79  . Lymphoma Father   . Diabetes Father   . Hypertension Sister   . Heart disease Sister   . Kidney disease Sister   . Lung disease Maternal Grandfather   . Bone cancer Paternal Grandfather   . Allergic rhinitis Daughter   . Breast  cancer Paternal Aunt   . Ovarian cancer Maternal Aunt   . Breast cancer Cousin   . Colon cancer Neg Hx   . Angioedema Neg Hx   . Eczema Neg Hx   . Immunodeficiency Neg Hx   . Urticaria Neg Hx      Current Outpatient Medications:  .  acetaminophen (TYLENOL) 325 MG tablet, Take 650 mg by mouth every 6 (six) hours as needed. Reported on 02/10/2016, Disp: , Rfl:  .  albuterol (PROAIR HFA) 108 (90 Base) MCG/ACT inhaler, Inhale 2 puffs into the lungs every 4 (four) hours as needed for wheezing. Or coughing spells.  You may use 2 Puffs 5-10 minutes before exercise., Disp: 1 Inhaler, Rfl: 3 .  Alpha-D-Galactosidase (BEANO PO), Take 1 tablet by mouth as needed (if eating onion or garlic). , Disp: , Rfl:  .  AMBULATORY NON FORMULARY MEDICATION, Take 2 capsules by mouth daily. Cinnsulin cinnamon caplsule 1 by mouth daily , Disp: , Rfl:  .  Ascorbic Acid (VITAMIN C) 1000 MG tablet, Take 1,000 mg by mouth daily., Disp: , Rfl:  .  aspirin EC 81 MG tablet, Take 1 tablet (81 mg total) by mouth daily., Disp: 90 tablet, Rfl: 3 .  Calcium-Vitamin D-Vitamin K (VIACTIV) 182-993-71 MG-UNT-MCG CHEW, Chew 2 tablets by mouth daily. , Disp: , Rfl:  .  chlorpheniramine (CHLOR-TRIMETON) 4 MG tablet, Take 4 mg by mouth daily as needed for allergies. , Disp: , Rfl:  .  dicyclomine (BENTYL) 10  MG capsule, Take 1 capsule (10 mg total) by mouth 2 (two) times daily as needed for spasms., Disp: 60 capsule, Rfl: 1 .  ezetimibe (ZETIA) 10 MG tablet, Take 1 tablet (10 mg total) by mouth daily. Please keep upcoming appt for future refills. Thank you., Disp: 90 tablet, Rfl: 3 .  fluconazole (DIFLUCAN) 100 MG tablet, Take 100 mg by mouth daily., Disp: , Rfl:  .  fluticasone (FLONASE) 50 MCG/ACT nasal spray, SPRAY 2 SPRAYS INTO EACH NOSTRIL EVERY DAY, Disp: 1 g, Rfl: 12 .  fluticasone furoate-vilanterol (BREO ELLIPTA) 100-25 MCG/INH AEPB, TAKE 1 PUFF BY MOUTH EVERY DAY, Disp: 180 each, Rfl: 1 .  metoprolol succinate (TOPROL-XL) 50 MG  24 hr tablet, Take one half tablet by mouth daily with or immediately following a meal., Disp: , Rfl:  .  montelukast (SINGULAIR) 10 MG tablet, TAKE 1 TABLET BY MOUTH EVERY DAY, Disp: 30 tablet, Rfl: 0 .  Multiple Vitamin (MULTIVITAMIN) tablet, Take 1 tablet by mouth daily., Disp: , Rfl:  .  Naproxen Sodium (ALEVE) 220 MG CAPS, Take 1-2 capsules by mouth as needed., Disp: , Rfl:  .  omeprazole (PRILOSEC) 20 MG capsule, Take 1 capsule (20 mg total) by mouth daily. (Patient taking differently: Take 10 mg by mouth daily. Patient takes 10 mg), Disp: 30 capsule, Rfl: 5 .  pravastatin (PRAVACHOL) 40 MG tablet, Take 40 mg by mouth daily., Disp: , Rfl:  .  predniSONE (DELTASONE) 10 MG tablet, Take 1 tablet (10 mg total) by mouth daily with breakfast., Disp: 90 tablet, Rfl: 0 .  Probiotic Product (PROBIOTIC ADVANCED PO), Take 2 capsules by mouth daily. , Disp: , Rfl:  .  Turmeric 500 MG CAPS, Take 1 capsule by mouth daily., Disp: , Rfl:  .  gabapentin (NEURONTIN) 300 MG capsule, Take 1 capsule (300 mg total) by mouth at bedtime., Disp: 30 capsule, Rfl: 5 .  metoprolol tartrate (LOPRESSOR) 50 MG tablet, Take 1 tablet (50 mg total) by mouth once for 1 dose., Disp: 2 tablet, Rfl: 0 .  nitroGLYCERIN (NITROSTAT) 0.4 MG SL tablet, Place 1 tablet (0.4 mg total) under the tongue every 5 (five) minutes as needed for chest pain. (Patient not taking: Reported on 12/15/2018), Disp: 25 tablet, Rfl: 12      Objective:   Vitals:   12/15/18 1418  BP: (!) 142/70  Pulse: (!) 50  SpO2: 96%  Weight: 148 lb (67.1 kg)  Height: '4\' 11"'  (1.499 m)    Estimated body mass index is 29.89 kg/m as calculated from the following:   Height as of this encounter: '4\' 11"'  (1.499 m).   Weight as of this encounter: 148 lb (67.1 kg).  '@WEIGHTCHANGE' @  [employer]   12/15/18 1418  Weight: 148 lb (67.1 kg)     Physical Exam  General Appearance:    Alert, cooperative, no distress, appears stated age - yes , Deconditioned looking  - no , OBESE  - no, Sitting on Wheelchair -  no  Head:    Normocephalic, without obvious abnormality, atraumatic  Eyes:    PERRL, conjunctiva/corneas clear,  Ears:    Normal TM's and external ear canals, both ears  Nose:   Nares normal, septum midline, mucosa normal, no drainage    or sinus tenderness. OXYGEN ON  - no . Patient is @ ra   Throat:   Lips, mucosa, and tongue normal; teeth and gums normal. Cyanosis on lips - no  Neck:   Supple, symmetrical, trachea midline, no adenopathy;  thyroid:  no enlargement/tenderness/nodules; no carotid   bruit or JVD  Back:     Symmetric, no curvature, ROM normal, no CVA tenderness  Lungs:     Distress - no , Wheeze no, Barrell Chest - no, Purse lip breathing - no, Crackles - yes UL and LL   Chest Wall:    No tenderness or deformity.    Heart:    Regular rate and rhythm, S1 and S2 normal, no rub   or gallop, Murmur - no  Breast Exam:    NOT DONE  Abdomen:     Soft, non-tender, bowel sounds active all four quadrants,    no masses, no organomegaly. Visceral obesity - no  Genitalia:   NOT DONE  Rectal:   NOT DONE  Extremities:   Extremities - normal, Has Cane - no, Clubbing - no, Edema - no  Pulses:   2+ and symmetric all extremities  Skin:   Stigmata of Connective Tissue Disease - no  Lymph nodes:   Cervical, supraclavicular, and axillary nodes normal  Psychiatric:  Neurologic:   Pleasant - yes, Anxious - no, Flat affect - no  CAm-ICU - neg, Alert and Oriented x 3 - yes, Moves all 4s - yes, Speech - normal, Cognition - intact           Assessment:       ICD-10-CM   1. ILD (interstitial lung disease) (HCC) J84.9 Pulmonary function test  2. Hypersensitivity pneumonitis ([place]) J67.9   3. Right-sided chest wall pain R07.89        Plan:     Patient Instructions     ICD-10-CM   1. ILD (interstitial lung disease) ([place]) J84.9   2. Hypersensitivity pneumonitis ([place]) J67.9   3. Right-sided chest wall pain R07.89   ILD (interstitial lung  disease) (HCC) Hypersensitivity pneumonitis (HCC)  - clinically stable since last visit - clinically better after recent flare up  Plan  - continue prednisone 71m per day  -Good idea to start nintedanib once we have insurance approval (we will start with 100 mg lower dose taking 1 tablet a day for 1 month and then escalate 100 mg twice daily]  -This is an anti-fibrotic -Do repeat spirometry and DLCO in April 2020   Right-sided chest wall pain -getting worse  -I suspect this is neuropathic pain following surgical lung biopsy many years ago -Unclear why this is getting worse -Ideally would need a CT scan of the chest but will check with radiologist if CT coronary angiogram of the chest can become issues in the right lung field -Meanwhile we will start gabapentin 300 mg once daily at night  -Watch out for any fatigue or drowsiness  Follow-up -April 2020 in ILD clinic but after spirometry and DLCO    > 50% of this > 25 min visit spent in face to face counseling or coordination of care - by this undersigned MD - Dr MBrand Males This includes one or more of the following documented above: discussion of test results, diagnostic or treatment recommendations, prognosis, risks and benefits of management options, instructions, education, compliance or risk-factor reduction   SIGNATURE    Dr. MBrand Males M.D., F.C.C.P,  Pulmonary and Critical Care Medicine Staff Physician, CLaurel HillDirector - Interstitial Lung Disease  Program  [place]ulmonary [place] [id number] Pager: [phone] If no answer or between  15:00h - 7:00h: call [phone] Telephone:  [phone]  2:58 PM 12/15/2018

## 2018-12-26 ENCOUNTER — Other Ambulatory Visit: Payer: Self-pay | Admitting: Family Medicine

## 2018-12-26 ENCOUNTER — Ambulatory Visit
Admission: RE | Admit: 2018-12-26 | Discharge: 2018-12-26 | Disposition: A | Discharge status: Home / Self Care | Payer: Medicare Other | Source: Ambulatory Visit | Attending: Family Medicine | Admitting: Family Medicine

## 2018-12-26 DIAGNOSIS — E785 Hyperlipidemia, unspecified: Secondary | ICD-10-CM | POA: Diagnosis not present

## 2018-12-26 DIAGNOSIS — M549 Dorsalgia, unspecified: Secondary | ICD-10-CM | POA: Diagnosis not present

## 2018-12-26 DIAGNOSIS — M5410 Radiculopathy, site unspecified: Secondary | ICD-10-CM | POA: Diagnosis not present

## 2018-12-26 DIAGNOSIS — R52 Pain, unspecified: Secondary | ICD-10-CM

## 2018-12-26 DIAGNOSIS — J8410 Pulmonary fibrosis, unspecified: Secondary | ICD-10-CM | POA: Diagnosis not present

## 2018-12-26 DIAGNOSIS — R7301 Impaired fasting glucose: Secondary | ICD-10-CM | POA: Diagnosis not present

## 2018-12-26 DIAGNOSIS — Z0000 Encounter for general adult medical examination without abnormal findings: Secondary | ICD-10-CM | POA: Diagnosis not present

## 2018-12-26 DIAGNOSIS — R0781 Pleurodynia: Secondary | ICD-10-CM | POA: Diagnosis not present

## 2019-01-04 ENCOUNTER — Telehealth: Payer: Self-pay

## 2019-01-04 ENCOUNTER — Other Ambulatory Visit: Payer: Self-pay | Admitting: Family Medicine

## 2019-01-04 NOTE — Telephone Encounter (Signed)
Spoke Rhonda [name] about scheduling the Cardiac CT. She called the CT department at [facility] and they stated she would need prophylaxis for an allergy to Iodine. Left a message for the patient to call back.

## 2019-01-04 NOTE — Telephone Encounter (Signed)
lpmtcb 2/12 

## 2019-01-05 MED ORDER — PREDNISONE 50 MG PO TABS: ORAL_TABLET | ORAL | 0 refills | Status: DC

## 2019-01-05 NOTE — Telephone Encounter (Signed)
lpmtcb 2/13

## 2019-01-06 ENCOUNTER — Telehealth (HOSPITAL_COMMUNITY): Payer: Self-pay | Admitting: Emergency Medicine

## 2019-01-06 NOTE — Telephone Encounter (Signed)
Left message on voicemail with name and callback number Kenon Delashmit RN Navigator Cardiac Imaging [facility] [phone] Office [phone] Cell  

## 2019-01-06 NOTE — Telephone Encounter (Signed)
Left message to call back  

## 2019-01-09 ENCOUNTER — Telehealth: Payer: Self-pay

## 2019-01-09 ENCOUNTER — Telehealth (HOSPITAL_COMMUNITY): Payer: Self-pay | Admitting: Emergency Medicine

## 2019-01-09 DIAGNOSIS — E119 Type 2 diabetes mellitus without complications: Secondary | ICD-10-CM | POA: Diagnosis not present

## 2019-01-09 DIAGNOSIS — H5201 Hypermetropia, right eye: Secondary | ICD-10-CM | POA: Diagnosis not present

## 2019-01-09 DIAGNOSIS — H35372 Puckering of macula, left eye: Secondary | ICD-10-CM | POA: Diagnosis not present

## 2019-01-09 DIAGNOSIS — H26492 Other secondary cataract, left eye: Secondary | ICD-10-CM | POA: Diagnosis not present

## 2019-01-09 NOTE — Telephone Encounter (Signed)
Pt returning phone call --  pt verbalizes understanding of appt date/time, parking situation and where to check in, pre-test NPO status and medications ordered, and verified current allergies; name and call back number provided for further questions should they arise Rhonda Bond RN Navigator Cardiac Imaging [facility] [phone] office [phone] cell  Pt understands prednisone schedule, when to take metoprolol

## 2019-01-09 NOTE — Telephone Encounter (Signed)
I spoke to the patient and clarified the instructions for her Coronary CTA 2/18.  She verbalized understanding.

## 2019-01-09 NOTE — Telephone Encounter (Signed)
I spoke to the patient this morning regarding her scheduled Coronary CTA 2/18.  The patient apparently blocked our phone number, which made it difficult to reach her, only by My Chart.  We have clarified direction/medication for procedure.  She verbalized understanding.

## 2019-01-10 ENCOUNTER — Ambulatory Visit (HOSPITAL_COMMUNITY)
Admission: RE | Admit: 2019-01-10 | Discharge: 2019-01-10 | Disposition: A | Discharge status: Home / Self Care | Payer: Medicare Other | Source: Ambulatory Visit | Attending: Medical | Admitting: Medical

## 2019-01-10 ENCOUNTER — Ambulatory Visit: Payer: BC Managed Care – PPO | Admitting: Physician Assistant

## 2019-01-10 ENCOUNTER — Ambulatory Visit (HOSPITAL_COMMUNITY): Payer: Medicare Other

## 2019-01-10 DIAGNOSIS — E785 Hyperlipidemia, unspecified: Secondary | ICD-10-CM | POA: Insufficient documentation

## 2019-01-10 DIAGNOSIS — R079 Chest pain, unspecified: Secondary | ICD-10-CM

## 2019-01-10 DIAGNOSIS — Z01812 Encounter for preprocedural laboratory examination: Secondary | ICD-10-CM | POA: Insufficient documentation

## 2019-01-10 DIAGNOSIS — I493 Ventricular premature depolarization: Secondary | ICD-10-CM | POA: Insufficient documentation

## 2019-01-10 MED ORDER — METOPROLOL TARTRATE 5 MG/5ML IV SOLN
INTRAVENOUS | Status: AC
  Filled 2019-01-10: qty 5

## 2019-01-10 MED ORDER — NITROGLYCERIN 0.4 MG SL SUBL
SUBLINGUAL_TABLET | SUBLINGUAL | Status: AC
  Filled 2019-01-10: qty 2

## 2019-01-10 MED ORDER — METOPROLOL TARTRATE 5 MG/5ML IV SOLN
10.0000 mg | INTRAVENOUS | Status: DC | PRN
  Administered 2019-01-10: 10 mg via INTRAVENOUS
  Administered 2019-01-10: 5 mg via INTRAVENOUS
  Filled 2019-01-10 (×2): qty 10

## 2019-01-10 MED ORDER — NITROGLYCERIN 0.4 MG SL SUBL
0.8000 mg | SUBLINGUAL_TABLET | Freq: Once | SUBLINGUAL | Status: DC
  Filled 2019-01-10: qty 25

## 2019-01-10 MED ORDER — METOPROLOL TARTRATE 5 MG/5ML IV SOLN
INTRAVENOUS | Status: AC
  Filled 2019-01-10: qty 10

## 2019-01-10 NOTE — Progress Notes (Signed)
Patients HR in trigeminy ranging from 50-110. Patient HR to irregular and abnormal to precede with CT heart despite Betablockers being given. [place] notified by Clarise Cruz, RN( CT Heart Coordinator) who was present in room during time of whole exam.

## 2019-01-11 ENCOUNTER — Telehealth: Payer: Self-pay | Admitting: Cardiology

## 2019-01-11 NOTE — Telephone Encounter (Signed)
See MY CHART message 01/09/19.

## 2019-01-11 NOTE — Progress Notes (Signed)
Cardiology Office Note:    Date:  01/12/2019   ID:  Rhonda [name], DOB 10/05/1951, MRN [id number]  PCP:  Hayden Rasmussen, MD  Cardiologist:  No primary care provider on file.    Referring MD: Hayden Rasmussen, MD   Chief Complaint  Patient presents with  . Mitral Regurgitation  . Hyperlipidemia    History of Present Illness:    Rhonda [name] is a 68 y.o. female with a hx of ILD followed by Pulmonary and the Transplant team at [facility], GERD, dyslipidemia, coronary artery calcifications,  PVCs and abnormal EKG with normal nuclear stress test 2016.  At her office visit in September 2018 I recommended a CT calcium score and a stress test because of coronary artery calcifications that were noted on prior chest CT.  Chest CT showed a high coronary calcium score of 343 and risk factor modification was recommended.    She was seen back by the extender in January and was doing well with her walking and swimming without any exertional chest pain.  She complained of that she was having intermittent chest pain over the past weeks to months described as a tightness but only occurring in the evenings several times a week.  Because of her high coronary calcium score a coronary CTA was recommended to define coronary anatomy.  Unfortunately when she presented for the coronary CTA she was having frequent PVCs and the study could not be done.    She is now here for follow-up.   She denies any further chest pain or pressure, PND, orthopnea, LE edema, dizziness, palpitations or syncope.  She has chronic dyspnea on exertion due to interstitial lung disease that is not changed.  She is compliant with her meds and is tolerating meds with no SE.    Past Medical History:  Diagnosis Date  . Allergy   . Arthritis    history spinal stenosis. osteoarthritis right hip  . Asthma   . Cataract   . Coronary artery calcification seen on CAT scan 08/19/2017   >300 on CT scan 08/2017  . Depression   . DOE (dyspnea  on exertion)    a. 04/2010 Lexi MV EF 71%, no ischemia/infarct;    . GERD (gastroesophageal reflux disease)   . H/O steroid therapy    Steroid use orally over 4 yrs- for Lung Fibrosis  . Heart palpitations 02/28/2015  . Helicobacter pylori ab+   . Hiatal hernia   . High cholesterol   . History of chronic bronchitis    as child  . Hyperlipidemia, mixed 08/19/2017  . Hyperplastic colon polyp 2007  . Inguinal hernia    right  . Insulin resistance    past  . Interstitial lung disease ([place])   . MVP (mitral valve prolapse)    Posterior mitral valve leaflet with mild MR  . Pneumonitis, hypersensitivity ([place])    a. 09/2012 s/p Bx - ? 2/2 bird, mold, oil paint exposure ->on steroids, followed by pulm.  . Pulmonary fibrosis (HCC)    Dr. Chase Caller follows- stable at present  . Pulmonary HTN ([place])    mild PASP 108mmHg on echo 02/2015  . PVC (premature ventricular contraction) 08/19/2017  . Rapid heart rate    Dr. Radford Pax follows- last visit Epic note 9'16    Past Surgical History:  Procedure Laterality Date  . [place] STUDY N/A 02/21/2018   Procedure: [place] STUDY;  Surgeon: Mauri Pole, MD;  Location: [facility];  Service:  Endoscopy;  Laterality: N/A;  . BREAST BIOPSY Right 2009   BIOPSY  . BREAST BIOPSY Left 2003   Benign   . CATARACT EXTRACTION Left   . CESAREAN SECTION    . ESOPHAGEAL MANOMETRY N/A 02/21/2018   Procedure: ESOPHAGEAL MANOMETRY (EM);  Surgeon: Mauri Pole, MD;  Location: [facility];  Service: Endoscopy;  Laterality: N/A;  . FOOT FRACTURE SURGERY  2006 or 2007   right  . HYMENECTOMY    . LUNG BIOPSY  09/28/2012   Procedure: LUNG BIOPSY;  Surgeon: Melrose Nakayama, MD;  Location: [facility];  Service: Thoracic;  Laterality: N/A;  lung biopsies tims three  . SQUAMOUS CELL CARCINOMA EXCISION Left    left arm  . TOTAL HIP ARTHROPLASTY Right 09/10/2015   Procedure: RIGHT TOTAL HIP ARTHROPLASTY ANTERIOR APPROACH;  Surgeon: Paralee Cancel, MD;  Location:  [facility];  Service: Orthopedics;  Laterality: Right;  . TUBAL LIGATION    . VIDEO ASSISTED THORACOSCOPY  09/28/2012   Procedure: VIDEO ASSISTED THORACOSCOPY;  Surgeon: Melrose Nakayama, MD;  Location: [place];  Service: Thoracic;  Laterality: Right;  Marland Kitchen VIDEO BRONCHOSCOPY  11/19/2011   Procedure: VIDEO BRONCHOSCOPY WITH FLUORO;  Surgeon: Brand Males, MD;  Location: [facility];  Service: Endoscopy;;    Current Medications: Current Meds  Medication Sig  . acetaminophen (TYLENOL) 325 MG tablet Take 650 mg by mouth every 6 (six) hours as needed. Reported on 02/10/2016  . albuterol (PROAIR HFA) 108 (90 Base) MCG/ACT inhaler Inhale 2 puffs into the lungs every 4 (four) hours as needed for wheezing. Or coughing spells.  You may use 2 Puffs 5-10 minutes before exercise.  . Alpha-D-Galactosidase (BEANO PO) Take 1 tablet by mouth as needed (if eating onion or garlic).   . AMBULATORY NON FORMULARY MEDICATION Take 2 capsules by mouth daily. Cinnsulin cinnamon caplsule 1 by mouth daily   . Ascorbic Acid (VITAMIN C) 1000 MG tablet Take 1,000 mg by mouth daily.  Marland Kitchen aspirin EC 81 MG tablet Take 1 tablet (81 mg total) by mouth daily.  . Calcium-Vitamin D-Vitamin K (VIACTIV) 161-096-04 MG-UNT-MCG CHEW Chew 2 tablets by mouth daily.   . chlorpheniramine (CHLOR-TRIMETON) 4 MG tablet Take 4 mg by mouth daily as needed for allergies.   Marland Kitchen dicyclomine (BENTYL) 10 MG capsule Take 1 capsule (10 mg total) by mouth 2 (two) times daily as needed for spasms.  Marland Kitchen ezetimibe (ZETIA) 10 MG tablet Take 1 tablet (10 mg total) by mouth daily. Please keep upcoming appt for future refills. Thank you.  . fluconazole (DIFLUCAN) 100 MG tablet Take 100 mg by mouth daily.  . fluticasone (FLONASE) 50 MCG/ACT nasal spray SPRAY 2 SPRAYS INTO EACH NOSTRIL EVERY DAY  . fluticasone furoate-vilanterol (BREO ELLIPTA) 100-25 MCG/INH AEPB TAKE 1 PUFF BY MOUTH EVERY DAY  . gabapentin (NEURONTIN) 300 MG capsule Take 1 capsule (300 mg total) by  mouth at bedtime.  . metoprolol succinate (TOPROL-XL) 50 MG 24 hr tablet Take 25 mg by mouth daily. Take one half tablet by mouth daily with or immediately following a meal.   . montelukast (SINGULAIR) 10 MG tablet TAKE 1 TABLET BY MOUTH EVERY DAY  . Multiple Vitamin (MULTIVITAMIN) tablet Take 1 tablet by mouth daily.  . Naproxen Sodium (ALEVE) 220 MG CAPS Take 1-2 capsules by mouth as needed.  . nitroGLYCERIN (NITROSTAT) 0.4 MG SL tablet Place 1 tablet (0.4 mg total) under the tongue every 5 (five) minutes as needed for chest pain.  Marland Kitchen omeprazole (PRILOSEC) 20  MG capsule Take 1 capsule (20 mg total) by mouth daily. (Patient taking differently: Take 10 mg by mouth daily. Patient takes 10 mg)  . pravastatin (PRAVACHOL) 40 MG tablet Take 40 mg by mouth daily.  . predniSONE (DELTASONE) 10 MG tablet Take 1 tablet (10 mg total) by mouth daily with breakfast.  . predniSONE (DELTASONE) 50 MG tablet Take 50 mg (1 tablet) at 7:30 pm 2/17, 1:30 am 2/18, and 7:30 am 2/18  . Probiotic Product (PROBIOTIC ADVANCED PO) Take 2 capsules by mouth daily.   . Turmeric 500 MG CAPS Take 1 capsule by mouth daily.     Allergies:   Atorvastatin; Betadine [povidone iodine]; Codeine; Garlic; Onion; Rosuvastatin; Shellfish allergy; and Sulfonamide derivatives   Social History   Socioeconomic History  . Marital status: Married    Spouse name: [place]  . Number of children: 1  . Years of education: Not on file  . Highest education level: Not on file  Occupational History  . 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 Financial resource strain: Not on file  . Food insecurity:    Worry: Not on file    Inability: Not on file  . Transportation needs:    Medical: Not on file    Non-medical: Not on file  Tobacco Use  . Smoking status: Never Smoker  . Smokeless tobacco: Never Used  . Tobacco comment: pt states she experimented in college    Substance and Sexual Activity  . Alcohol use: Yes    Alcohol/week: 2.0 standard drinks    Types: 1 Glasses of wine, 1 Standard drinks or equivalent per week    Comment: once a wk. 1-2 glasses  . Drug use: No  . Sexual activity: Never    Birth control/protection: None    Comment: sex partners in the last 12 months 0  Lifestyle  . Physical activity:    Days per week: Not on file    Minutes per session: Not on file  . Stress: Not on file  Relationships  . Social connections:    Talks on phone: Not on file    Gets together: Not on file    Attends religious service: Not on file    Active member of club or organization: Not on file    Attends meetings of clubs or organizations: Not on file    Relationship status: Not on file  Other Topics Concern  . Not on file  Social History Narrative   Married; Electrical engineer      Exercise - swimming daily for 20-30 minutes   Patient was started her periods at 68 year old (regular periods), and painful periods        Family History: The patient's family history includes Allergic rhinitis in her daughter; Asthma in her maternal grandmother and mother; Bone cancer in her paternal grandfather; Breast cancer in her cousin and paternal aunt; Dementia (age of onset: 91) in her mother; Diabetes in her father; Emphysema in her maternal grandmother; Heart disease in her sister; Hypertension in her sister; Kidney disease in her sister; Lung disease in her maternal grandfather; Lymphoma in her father; Osteoarthritis in her mother; Ovarian cancer in her maternal aunt. There is no history of Colon cancer, Angioedema, Eczema, Immunodeficiency, or Urticaria.  ROS:   Please see the history of present illness.    ROS  All other systems reviewed and negative.   EKGs/Labs/Other Studies  Reviewed:    The following studies were reviewed today: none  EKG:  EKG is  ordered today and showed NSR at 77bpm with occasional PVCs and T wave abnormality in the  lateral precordial leads unchanged from 07/2017  Recent Labs: 12/08/2018: ALT 14; BUN 18; Creatinine, Ser 0.88; Potassium 3.9; Sodium 142   Recent Lipid Panel    Component Value Date/Time   CHOL 177 12/08/2018 0917   TRIG 131 12/08/2018 0917   HDL 68 12/08/2018 0917   CHOLHDL 2.6 12/08/2018 0917   CHOLHDL 3.5 02/26/2015 1540   VLDL 37 02/26/2015 1540   LDLCALC 83 12/08/2018 0917    Physical Exam:    VS:  BP 116/66   Pulse 69   Ht 4\' 11"  (1.499 m)   Wt 149 lb 12.8 oz (67.9 kg)   SpO2 95%   BMI 30.26 kg/m     Wt Readings from Last 3 Encounters:  01/12/19 149 lb 12.8 oz (67.9 kg)  12/15/18 148 lb (67.1 kg)  12/06/18 150 lb 6.4 oz (68.2 kg)     GEN:  Well nourished, well developed in no acute distress HEENT: Normal NECK: No JVD; No carotid bruits LYMPHATICS: No lymphadenopathy CARDIAC: RRR, no murmurs, rubs, gallops but frequent ectopy RESPIRATORY:  Clear to auscultation without rales, wheezing or rhonchi  ABDOMEN: Soft, non-tender, non-distended MUSCULOSKELETAL:  No edema; No deformity  SKIN: Warm and dry NEUROLOGIC:  Alert and oriented x 3 PSYCHIATRIC:  Normal affect   ASSESSMENT:    1. Coronary artery calcification seen on CAT scan   2. Pulmonary HTN ([place])   3. MITRAL REGURGITATION   4. PVC (premature ventricular contraction)   5. Dyslipidemia    PLAN:    In order of problems listed above:  1.  Coronary artery calcifications on CT - coronary Ca+ score elevated at 343 placing at high risk of future events.  Needs aggressive control of lipids.  Unfortunately could not do coronary CTA due to frequent PVCs.  Therefore I will get a nuclear stress test to rule out ischemia especially in the setting of frequent ventricular ectopy.  Continue statin.   2.  Pulmonary HTN - PASP normal at 57mmHg on last echo.  3.  MVP with MR - only trivial on echo 2018.  4.  PVCs - asymptomatic but fairly frequent on exam.  I will get a 24-hour Holter monitor to assess PVC  load.  5.  Dyslipidemia - LDL 83 on 1//16/2020.  She will continue on Pravastatin 40mg  daily and Zetia 10mg  daily.   Medication Adjustments/Labs and Tests Ordered: Current medicines are reviewed at length with the patient today.  Concerns regarding medicines are outlined above.  No orders of the defined types were placed in this encounter.  No orders of the defined types were placed in this encounter.   Signed, Fransico Him, MD  01/12/2019 11:23 AM    [place]

## 2019-01-11 NOTE — Telephone Encounter (Signed)
Will wait till OV to discuss.

## 2019-01-11 NOTE — Telephone Encounter (Signed)
Patient states she is returning a call to [place], she said she has an appt tomorrow with Dr. Radford Pax. Suezanne Jacquet may not need to returned her call back.

## 2019-01-12 ENCOUNTER — Other Ambulatory Visit: Payer: Self-pay | Admitting: Internal Medicine

## 2019-01-12 ENCOUNTER — Encounter: Payer: Self-pay | Admitting: Cardiology

## 2019-01-12 ENCOUNTER — Ambulatory Visit (INDEPENDENT_AMBULATORY_CARE_PROVIDER_SITE_OTHER): Payer: Medicare Other | Admitting: Cardiology

## 2019-01-12 ENCOUNTER — Telehealth (HOSPITAL_COMMUNITY): Payer: Self-pay | Admitting: *Deleted

## 2019-01-12 VITALS — BP 116/66 | HR 69 | Ht 59.0 in | Wt 149.8 lb

## 2019-01-12 DIAGNOSIS — E785 Hyperlipidemia, unspecified: Secondary | ICD-10-CM | POA: Diagnosis not present

## 2019-01-12 DIAGNOSIS — R079 Chest pain, unspecified: Secondary | ICD-10-CM | POA: Diagnosis not present

## 2019-01-12 DIAGNOSIS — I493 Ventricular premature depolarization: Secondary | ICD-10-CM

## 2019-01-12 DIAGNOSIS — I2510 Atherosclerotic heart disease of native coronary artery without angina pectoris: Secondary | ICD-10-CM | POA: Diagnosis not present

## 2019-01-12 DIAGNOSIS — I080 Rheumatic disorders of both mitral and aortic valves: Secondary | ICD-10-CM

## 2019-01-12 DIAGNOSIS — I272 Pulmonary hypertension, unspecified: Secondary | ICD-10-CM | POA: Diagnosis not present

## 2019-01-12 NOTE — Telephone Encounter (Signed)
Per pharmacy notation, patient requested 90 day supply. Prescription issued 12/15/18 as 30 day supply with refills.

## 2019-01-12 NOTE — Patient Instructions (Signed)
Medication Instructions:  Your physician recommends that you continue on your current medications as directed. Please refer to the Current Medication list given to you today.  If you need a refill on your cardiac medications before your next appointment, please call your pharmacy.   Lab work: None If you have labs (blood work) drawn today and your tests are completely normal, you will receive your results only by: Marland Kitchen MyChart Message (if you have MyChart) OR . A paper copy in the mail If you have any lab test that is abnormal or we need to change your treatment, we will call you to review the results.  Testing/Procedures: Your physician has requested that you have a lexiscan myoview. For further information please visit [email or web address]. Please follow instruction sheet, as given.  Your physician has recommended that you wear a 24 Hour holter monitor. Holter monitors are medical devices that record the heart's electrical activity. Doctors most often use these monitors to diagnose arrhythmias. Arrhythmias are problems with the speed or rhythm of the heartbeat. The monitor is a small, portable device. You can wear one while you do your normal daily activities. This is usually used to diagnose what is causing palpitations/syncope (passing out).  Follow-Up: At [facility], you and your health needs are our priority.  As part of our continuing mission to provide you with exceptional heart care, we have created designated Provider Care Teams.  These Care Teams include your primary Cardiologist (physician) and Advanced Practice Providers (APPs -  Physician Assistants and Nurse Practitioners) who all work together to provide you with the care you need, when you need it. You will need a follow up appointment in 1 years.  Please call our office 2 months in advance to schedule this appointment.  You may see Dr. Radford Pax or one of the following Advanced Practice Providers on your designated Care Team:    [place], PA-C Melina Copa, PA-C . Ermalinda Barrios, PA-C

## 2019-01-12 NOTE — Telephone Encounter (Signed)
Patient given detailed instructions per Myocardial Perfusion Study Information Sheet for the test on 01/17/19. Patient notified to arrive 15 minutes early and that it is imperative to arrive on time for appointment to keep from having the test rescheduled.  If you need to cancel or reschedule your appointment, please call the office within 24 hours of your appointment. . Patient verbalized understanding.Rhonda [name] Rhonda [name]    

## 2019-01-17 ENCOUNTER — Ambulatory Visit: Payer: BC Managed Care – PPO | Admitting: Physician Assistant

## 2019-01-17 ENCOUNTER — Ambulatory Visit (HOSPITAL_COMMUNITY): Payer: Medicare Other | Attending: Cardiology

## 2019-01-17 VITALS — Ht 59.0 in | Wt 149.0 lb

## 2019-01-17 DIAGNOSIS — R110 Nausea: Secondary | ICD-10-CM | POA: Diagnosis not present

## 2019-01-17 DIAGNOSIS — R079 Chest pain, unspecified: Secondary | ICD-10-CM | POA: Insufficient documentation

## 2019-01-17 LAB — MYOCARDIAL PERFUSION IMAGING
CHL CUP RESTING HR STRESS: 72 {beats}/min
LV dias vol: 45 mL (ref 46–106)
LV sys vol: 21 mL
Peak HR: 96 {beats}/min
SDS: 2
SRS: 0
SSS: 2
TID: 0.95

## 2019-01-17 MED ORDER — REGADENOSON 0.4 MG/5ML IV SOLN
0.4000 mg | Freq: Once | INTRAVENOUS | Status: AC
  Administered 2019-01-17: 0.4 mg via INTRAVENOUS

## 2019-01-17 MED ORDER — TECHNETIUM TC 99M TETROFOSMIN IV KIT
10.9000 | PACK | Freq: Once | INTRAVENOUS | Status: AC | PRN
  Administered 2019-01-17: 10.9 via INTRAVENOUS
  Filled 2019-01-17: qty 11

## 2019-01-17 MED ORDER — AMINOPHYLLINE 25 MG/ML IV SOLN
75.0000 mg | Freq: Once | INTRAVENOUS | Status: AC
  Administered 2019-01-17: 75 mg via INTRAVENOUS

## 2019-01-17 MED ORDER — TECHNETIUM TC 99M TETROFOSMIN IV KIT
30.9000 | PACK | Freq: Once | INTRAVENOUS | Status: AC | PRN
  Administered 2019-01-17: 30.9 via INTRAVENOUS
  Filled 2019-01-17: qty 31

## 2019-01-18 ENCOUNTER — Ambulatory Visit
Admission: RE | Admit: 2019-01-18 | Discharge: 2019-01-18 | Disposition: A | Discharge status: Home / Self Care | Payer: BC Managed Care – PPO | Source: Ambulatory Visit | Attending: Family Medicine | Admitting: Family Medicine

## 2019-01-18 DIAGNOSIS — Z1231 Encounter for screening mammogram for malignant neoplasm of breast: Secondary | ICD-10-CM

## 2019-01-19 ENCOUNTER — Telehealth: Payer: Self-pay | Admitting: *Deleted

## 2019-01-19 NOTE — Telephone Encounter (Signed)
-----   Message from Sueanne Margarita, MD sent at 01/18/2019  6:09 PM EST ----- Please let patient know that stress test was fine

## 2019-01-19 NOTE — Telephone Encounter (Signed)
The patient has been notified of the result and verbalized understanding.  All questions (if any) were answered. Julaine Hua, [facility] 01/19/2019 12:43 PM

## 2019-01-19 NOTE — Telephone Encounter (Signed)
Left message to go over Myoview results.

## 2019-01-19 NOTE — Telephone Encounter (Signed)
°  Patient returning call for results. Please call again

## 2019-01-30 ENCOUNTER — Other Ambulatory Visit: Payer: Self-pay | Admitting: Family Medicine

## 2019-01-31 ENCOUNTER — Telehealth: Payer: Self-pay | Admitting: Internal Medicine

## 2019-01-31 NOTE — Telephone Encounter (Signed)
Rhonda  [name] wants me to take over montelukast prescriptions. Said send 10mg  qhs - 90 day supply to [facility] at [address]  Thanks    SIGNATURE    Dr. Brand Males, M.D., F.C.C.P,  Pulmonary and Critical Care Medicine Staff Physician, [place] Director - Interstitial Lung Disease  Program  Pulmonary [place] at [place], [place], [address]  Pager: [phone], If no answer or between  15:00h - 7:00h: call [phone] Telephone: [phone]  5:58 PM 01/31/2019

## 2019-02-01 ENCOUNTER — Other Ambulatory Visit: Payer: Self-pay | Admitting: *Deleted

## 2019-02-01 MED ORDER — MONTELUKAST SODIUM 10 MG PO TABS: 10.0000 mg | ORAL_TABLET | Freq: Every day | ORAL | 0 refills | Status: DC

## 2019-02-01 NOTE — Telephone Encounter (Signed)
Rx of montelukast sent to pt's preferred pharmacy for a 90-day supply.    Attempted to call pt but unable to reach her. Left a detailed message on pt's machine with the information stated above. Nothing further needed.

## 2019-02-04 ENCOUNTER — Other Ambulatory Visit: Payer: Self-pay | Admitting: Internal Medicine

## 2019-02-06 ENCOUNTER — Other Ambulatory Visit: Payer: Self-pay | Admitting: *Deleted

## 2019-02-06 MED ORDER — FLUTICASONE FUROATE-VILANTEROL 100-25 MCG/INH IN AEPB: INHALATION_SPRAY | RESPIRATORY_TRACT | 1 refills | Status: DC

## 2019-02-06 MED ORDER — FLUTICASONE PROPIONATE 50 MCG/ACT NA SUSP: NASAL | 3 refills | Status: DC

## 2019-02-06 NOTE — Telephone Encounter (Signed)
That is fine - call to reschedule monitor when she feels safe to do

## 2019-02-07 NOTE — Telephone Encounter (Signed)
Left a message stating the patient could reschedule her monitor at her convenience.

## 2019-03-21 ENCOUNTER — Ambulatory Visit: Payer: Medicare Other | Admitting: Internal Medicine

## 2019-03-21 DIAGNOSIS — N760 Acute vaginitis: Secondary | ICD-10-CM | POA: Diagnosis not present

## 2019-03-21 DIAGNOSIS — E785 Hyperlipidemia, unspecified: Secondary | ICD-10-CM | POA: Diagnosis not present

## 2019-03-21 DIAGNOSIS — M5410 Radiculopathy, site unspecified: Secondary | ICD-10-CM | POA: Diagnosis not present

## 2019-03-24 ENCOUNTER — Other Ambulatory Visit: Payer: Self-pay | Admitting: Internal Medicine

## 2019-03-24 NOTE — Telephone Encounter (Signed)
MR, please advise if you are okay with [place] refilling med for pt. Thanks!

## 2019-04-20 ENCOUNTER — Other Ambulatory Visit: Payer: Self-pay | Admitting: Internal Medicine

## 2019-04-25 ENCOUNTER — Other Ambulatory Visit: Payer: Self-pay | Admitting: Internal Medicine

## 2019-05-02 NOTE — Telephone Encounter (Signed)
MR, please advise when you were wanting [place] to set up the virtual visit with you?

## 2019-05-03 DIAGNOSIS — H26493 Other secondary cataract, bilateral: Secondary | ICD-10-CM | POA: Diagnosis not present

## 2019-05-03 DIAGNOSIS — H35372 Puckering of macula, left eye: Secondary | ICD-10-CM | POA: Diagnosis not present

## 2019-05-03 DIAGNOSIS — H43813 Vitreous degeneration, bilateral: Secondary | ICD-10-CM | POA: Diagnosis not present

## 2019-05-03 DIAGNOSIS — Z961 Presence of intraocular lens: Secondary | ICD-10-CM | POA: Diagnosis not present

## 2019-05-05 ENCOUNTER — Other Ambulatory Visit: Payer: Self-pay | Admitting: Internal Medicine

## 2019-05-05 ENCOUNTER — Other Ambulatory Visit (HOSPITAL_COMMUNITY): Admission: RE | Admit: 2019-05-05 | Payer: Medicare Other | Source: Ambulatory Visit

## 2019-05-06 ENCOUNTER — Other Ambulatory Visit (HOSPITAL_COMMUNITY)
Admission: RE | Admit: 2019-05-06 | Discharge: 2019-05-06 | Disposition: A | Discharge status: Home / Self Care | Payer: Medicare Other | Source: Ambulatory Visit | Attending: Internal Medicine | Admitting: Internal Medicine

## 2019-05-06 DIAGNOSIS — Z1159 Encounter for screening for other viral diseases: Secondary | ICD-10-CM | POA: Insufficient documentation

## 2019-05-08 LAB — NOVEL CORONAVIRUS, NAA (HOSP ORDER, SEND-OUT TO REF LAB; TAT 18-24 HRS): SARS-CoV-2, NAA: NOT DETECTED

## 2019-05-09 ENCOUNTER — Ambulatory Visit (INDEPENDENT_AMBULATORY_CARE_PROVIDER_SITE_OTHER): Payer: Medicare Other | Admitting: Internal Medicine

## 2019-05-09 ENCOUNTER — Encounter: Payer: Self-pay | Admitting: Internal Medicine

## 2019-05-09 ENCOUNTER — Other Ambulatory Visit: Payer: Self-pay | Admitting: General Surgery

## 2019-05-09 ENCOUNTER — Other Ambulatory Visit: Payer: Self-pay

## 2019-05-09 VITALS — BP 124/68 | HR 71 | Temp 98.0°F | Ht 59.0 in | Wt 148.2 lb

## 2019-05-09 DIAGNOSIS — I493 Ventricular premature depolarization: Secondary | ICD-10-CM | POA: Diagnosis not present

## 2019-05-09 DIAGNOSIS — J849 Interstitial pulmonary disease, unspecified: Secondary | ICD-10-CM

## 2019-05-09 DIAGNOSIS — J679 Hypersensitivity pneumonitis due to unspecified organic dust: Secondary | ICD-10-CM | POA: Diagnosis not present

## 2019-05-09 DIAGNOSIS — I2510 Atherosclerotic heart disease of native coronary artery without angina pectoris: Secondary | ICD-10-CM | POA: Diagnosis not present

## 2019-05-09 DIAGNOSIS — R0789 Other chest pain: Secondary | ICD-10-CM | POA: Diagnosis not present

## 2019-05-09 NOTE — Progress Notes (Signed)
#GE reflux with small hiatal hernia  - on ppi   #History of rapid heart rate not otherwise specified  - Start her on Lopressor  2008/2009 by primary care physician. History appears to correlate with onset of respiratory issues  - refuses to dc this drug as of 2014 discussion   # Hypersensitivity Pneumonitis and ILD  - Potential etiologies - cockateil x 2 for 18 years through end 2012. 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 Denies mold but lives in house built in [place] with a "weird baselment: and has humidifier  - first noted on CT chest 10/15/09 following trip to [place] (PE negative) but not described in 2003 CT chest report  - autoimmune panel: 10/23/11: Negative  -  Uderwennt bronch 11/19/11  - non diagnostic  - VATS Nov 2013 (done after initially refusing)- [facility]. However, there is worrying trend of UIP pattern in the Upper lobes.            Oct/Nov 2012  March 2013 08/11/12  Nov/Dec 2013 Dx HP/UIP 01/02/2013  02/28/13 05/25/2013  12/06/2013  03/15/2014  06/29/2014  10'27/15 12/19/2014  02/26/15 01/13/2016  04/29/2016  10/23/2016  01/25/2017  06/17/2017 [facility] July 2018 01/11/2018  04/13/2018  08/16/2018   Symp/Signs Dyspnea x5y  New crackles  dimished dyspnea except at hill. Improved crackles.  Improved dyspnea except @ hill                 FVC  2.6 L    2.2 L/82%   2.1 L/80%   2.19 L/82%  2.2L/91% 2.1/81% 1.86/69% 1.9L/71% 1.86/75% 1.75L/65% 1.82/73% (dec 2015 offce spiro) 2.84L/72% 1.54/61% 1.78L/67%  1.69L/65%  1.61/62% 1.35/55% 1.65/67%  DLCO  10/50%    10/51%   9.6/51%     11.9/63% 11.7/62%  11.561%  5.5/31% 10.06/57% 11/04/63%  10.43/55% 8.56/50% 10.57/61% 10.16/57% 9.21/52%  Walk test 185 ft x 3 laps on RA  rest 92%. Pk exertion 90%. Pk HR 108 Rest 92%. Pk exertion 86% at 2.5 laps. Pk HR 102  pk exertion - 88% Rest 96%. Pk 95%. Pk HR is 75. Done at full dose  lopressor Rest 94%. Pk 91%. Rest HR 93 with pk HR 103/min. Done at half dose lopressor REst 96%. Pk pulse ox 92%. Rest HR 65. Pk HR 86 Did not desat. Normal Pk HR  Pulse ox 96%, droped to89% at 2nd lap but bounced up and ended at 91% 3rd lap Did not desat Did not desat, Pk HHR 93  ;llowest pulse ox 90% at 2nd lap but she held her breath, then 92% at 3rd lap 99% -> 91% at end of 3rd lap 94%, Dropped to 87% at 3rd lap, HR peak102/m 97%, dropped to 89% in 3rd lap - asymptomatic. HR 100/min No prob with 62md at [facility] 99% rest - -> dopped to 92% 3rd lap. Pkr HR 107/min 98% rest -> 91% 3rd lap with Pk HR 91/min   CT chest   unchanged          yes Ct mid dec possibly worse - unsure Ct - similar to April 2016 but worse since 2013   CT 06/24/17 - similar to June 2017     Tests/Bx Bronch 0- non diagnostic   VATS - HP with UIP in Upper lobe                   RX  Rx pred start 11/23/12 On  pred 41m per day and will reduce to 258mOn Pred 2036mer day On pred 13m11mr day On pred 5mg 4m day Rx pred 5mg d4my She reduced to pred 5mg x 35mt 1 month QOD due to GI concern side effects On pred 5mg qod33mt will increase to 5/d after this visit On pred 5mg dail99m On pred 5mg daily75mncrease pred to 13mg per d20meduce to pred 7.5 due to skin sissues  pred burs and reduce to 13mg per da71m                         OV 06/17/2017  Chief Complaint  Patient presents with   Follow-up    [name]. Pt denies significant cough, denies CP/tightness, f/c/s.     She is better. Feels she does not need o2. Has been to [facility] for transplant clinic; felt too early. Seems higher dose prednisone helping but then she is also bettter t his time of the year. In May 2018 I was at [facility] and curbsided national thought leaders - feel that we could explore silent active GERD as a possibility of ILD getting wors with time.   OV 01/11/2018  Chief Complaint  Patient presents with   Follow-up    PFT done today.  Pt  states she has been coughing x2 weeks since she returned from a trip to [place]. FroDelawareing, pt has had pain in ribs. Pt states she has mild SOB which is stable due to rehab.  Pt states that Dr. Richter is wDarron Doomto know if pt could possibly have eosinophilic esophagitis.   Went to [place]. RetDelawarend has been coughing x 2 weeks. Associated with this is some rib pain on right side. She feels she does not need o2. She feels she does not need pred/abx at this time. She also exposed to flu wit family   OV 02/08/2018  Chief Complaint  Patient presents with   Acute Visit    CXR done 02/07/18.  Pt states her throat is sore but not as bad as it was yesterday when in office and does have some left ear pain. Pt states she has some pain on right rib cage and has increased fatigue.     Rhonda [name] preBasarautely.  I last saw her one month ago gave her Tamiflu for prophylaxis after flu exposure.  At that point in time she was already reducing her chronic stable prednisone of 10 mg for interstitial lung disease/hypersensitivity pneumonitis.  She had tapered herself slowly 3 or 4 weeks ago to 5 mg.  She says she was doing fairly okay but in the last 2 or 3 weeks she has had increased fatigue.  She says she goes daily swimming and then feels energized but then an hour later starts having fatigue which is more than usual.  Also in the daytime she has random fatigue.  In terms of her respiratory symptoms these are stable and in fact slightly better in terms of shortness of breath and cough but she is having sore throat without any fever and some left-sided otalgia.  In addition she is having right-sided chest pains that are more than usual.  The chest pains have been reported before.  These are specifically at localized spots along the previous incision several years ago for interstitial lung disease.  They are tender as well  to touch.  This pain is worse.  In addition she is also being bothered by arthralgia  in her hand and left hip.  The left hip arthralgias old with a hand arthralgias new.  She is worried about pneumothorax recurrence and so she had a chest x-ray yesterday that shows no pneumothorax.  She has stable interstitial lung disease changes but in my personal visualization these interstitial lung disease changes are worse compared to several years ago.  We do know that she has slowly worsening interstitial lung disease.  In terms of a walking desaturation test this shows stability since last visit.  She has seen GI for acid reflux and has pH probe study coming up  In talking to her she tells me she has been on metformin for over a month or 2.  This is new and is meant for pre-diabetes.  Apparently her hemoglobin A1c is always below 6 but is above 5.5.  Walking desaturation test on 02/08/2018 185 feet x 3 laps on ROOM AIR:  did walk normal pace with forehead probe desaturate. Rest pulse ox was 99%, final pulse ox was 93%. HR response 72/min at rest to 93/min at peak exertion. [name]  Did not Desaturate < 88% . Rhonda [name] yes did  Desaturated </= 3% points. Rhonda [name] yes did get tachyardic   Dg Chest 2 View  Result Date: 02/07/2018 CLINICAL DATA:  Chest wall pain for 2 weeks EXAM: CHEST - 2 VIEW COMPARISON:  CT 08/20/2017, radiograph 10/11/2015 FINDINGS: Coarse interstitial pattern consistent with pulmonary fibrosis, similar distribution compared to prior. No focal pulmonary opacity or pleural effusion. Stable cardiomediastinal silhouette with aortic atherosclerosis. No pneumothorax. Degenerative changes of the spine. IMPRESSION: No active cardiopulmonary disease. Similar appearance of diffuse pulmonary fibrosis. Negative for a pneumothorax. Electronically Signed   By: Donavan Foil M.D.   On: 02/07/2018 14:26    OV 04/13/2018  Chief Complaint  Patient presents with   Follow-up    Pt had pre spiro and DLCO PFT prior to OV. Pt has increase of productive  cough-thick white in last week. Pt has some wheezing, and allergy issues.   Rhonda [name] presents for routine follow-up.  I saw her acutely just approximately 2 months ago.  Then end of April 2019 she saw a nurse practitioner again acutely and was given antibiotic and prednisone.  She says she felt better after that but in the last week or so due to increased pollen load in the community and also moving furniture because her daughter is relocating out of her home she started having more cough.  There is no change in her dyspnea with the cough is really bad.  This is despite Singulair and Chlor-Trimeton.  Those things to help but just take the edge off.  She feels that the pollen making the cough worse.  There is no change in dyspnea.  Walking desaturation test documented below his baseline.  She had pulmonary function test today but the [facility] shows significant decline compared to February 2019 and she feels surprised by this.  DLCO is baseline unchanged and a walking desaturation test limited in the office is also unchanged.  She does not have any fever but has significant postnasal drip.  She has not followed up at [facility] transplant clinic or the allergist recently.  OV 08/16/2018  Subjective:  Patient ID: Rhonda [name], female , DOB: 1950-12-16 , age 37 y.o. , MRN: [id number] , ADDRESS: [address]  [place] [address]   08/16/2018 -   Chief Complaint  Patient presents with   Follow-up    PFT performed today.  Pt states she has had a lot of mucus the month of September 2019 so she has been taking the Chlor-Trimeton. States other than that she has been able to do a lot more activities and has been swimming a lot more. Pt states she believes the SOB is stable.     HPI Rhonda [name] 68 y.o. -continues to do well.  She is taking higher dose of prednisone 10 mg/day.  Also the summer usually she is doing well.  She is not having much of a cough.  Lung function test shows improvement  compared to tests done earlier this year.  In fact she is almost as good as much 2018.  Still compared to 2014-2016 she has progressive lung disease.  Walking desaturation test is stable.  She needs a high-dose flu shot but we do not have stock today.  She has lost some weight intentionally with better dietary control.  She is exercising regularly swimming.  We discussed ofev  potential future therapy if the drug is approved for this indication.  We will know study results in a year or so.  She is open to the idea but is worried somewhat about her irritable bowel syndrome flaring up.  She is no longer following at the [facility] lung transplant clinic given stability lung function  Other issues: She always has right-sided chest wall pain to the site of lung biopsy.  The only way this is been resolved as by her limiting her use of bra .  Also her irritable bowel syndrome is under control but in case it flares up she wants a refill of dicyclomine     OV 12/15/2018  Subjective:  Patient ID: Rhonda [name], female , DOB: September 19, 1951 , age 43 y.o. , MRN: [id number] , ADDRESS: [address] [place] [id number]   12/15/2018 -   Chief Complaint  Patient presents with   Follow-up    Pt states she has been doing better since last visit with TP but states she has been having pain in her rib cage x2 years but states it has become more intense.     HPI Rhonda [name] 68 y.o. -returns for follow-up of her ILD due to hypersensitivity pneumonitis.  She did a clinic visit also for research with the ILD-pro registry study.  Since I last saw her in September 2019 she had a flareup and required higher doses of prednisone.  Since then she is returned to baseline of 5 mg prednisone and she feels good.  She is stable.  Walking desaturation test shows stability.  We have been discussing over the last few months about taking nintedanib based on new data and progressive non--IPF ILD's.  She has trepidation for  this because of irritable bowel syndrome.  After much discussion she is willing to try 100 mg once daily for a month and then escalate to 100 mg twice daily which is the therapeutic dose.  She will do this once after insurance approval which we suspect will happen over the next few months.  Her main issue is that her right sided infra-axillary chest pain is getting worse.  This started after her surgical lung biopsy and pneumothorax on the right side.  It is random and happens with twisting.  But now days it happens once every few days.  Previously it used to happen  once every few weeks.  Sometimes it is excruciating but then she is left in pain for a few days.  Applying Abrol sudden twisting of her body makes it worse.  Is a lancinating pain.  She did try gabapentin some years ago for chronic cough .  She did tolerate the gabapentin well.  She does not remember if it helped the pain or not but clearly back and the pain was much less intense.  She is willing to try this again.  I recommended a CT scan of the chest but she is going to have radiation for a CT coronary angiogram because of high levels of coronary artery calcification.  Therefore written to the radiologist to see if he can look at the lung parenchyma with a CT angiogram    OV 05/09/2019  Subjective:  Patient ID: Rhonda [name], female , DOB: 02-06-1951 , age 68 y.o. , MRN: [id number] , ADDRESS: [address] [place] [address]   05/09/2019 -   Chief Complaint  Patient presents with   ILD (Interstitial Lung Disease)     HPI Rhonda [name] 68 y.o. -follow-up for chronic care physician pneumonitis on daily 5 mg prednisone.  She has problems with right-sided chest wall pain that is neuropathic.  She tells me since her last visit January 2020 she has been isolating and social distancing because of the pandemic.  Overall she is been stable.  Her symptom scores are mild and documented below.  She continues with prednisone.  She was  supposed to have pulmonary function test but this was held because of the pandemic.  Her walking desaturation test shows she is stable.  She is supposed to have pulmonary function test tomorrow but wanted to see me today.  In the interim she did have a cardiac CT scan of the chest that shows 94th percentile of coronary calcium.  She could not get a CT angiogram done because of PVC.  A nuclear medicine stress test was done and I reviewed this result and it was normal.  A Holter test is pending.  She is kind of nervous because of the ongoing COVID-19 situation and safety of doing a Holter test.  In terms of her right chest wall pain she is adapted to it.  She does not use any bra  SYMPTOM SCALE - ILD 05/09/2019   O2 use ra  Shortness of Breath 0 -> 5 scale with 5 being worst (score 6 If unable to do)  At rest 0  Simple tasks - showers, clothes change, eating, shaving 0  Household (dishes, doing bed, laundry) 1  Shopping 1.5  Walking level at own pace 2  Walking keeping up with others of same age 62  Walking up Stairs 3  Walking up Hill 3  Total (40 - 48) Dyspnea Score 12  How bad is your cough? 1  How bad is your fatigue 0     Results for Rhonda [name], Rhonda [name] (MRN [id number]) as of 12/15/2018 14:20  Ref. Range 12/06/2013 13:03 03/15/2014 15:56 09/18/2014 11:41 02/26/2015 09:02 12/16/2015 16:31 04/29/2016 12:44 01/25/2017 10:16 01/11/2018 15:19 04/13/2018 08:47 08/16/2018 09:00  FVC-Pre Latest Units: L 1.86 1.91 1.73 1.84 1.42  1.69 1.61 1.35 1.65  FVC-%Pred-Pre Latest Units: % '69 71 64 72 56 67 65 62 55 67 '      Simple office walk 185 feet x  3 laps goal with forehead probe 04/13/2018  08/16/2018  12/15/2018  05/09/2019   O2 used Room air Room air  Room air Room air  Number laps completed '3 3 3 3  ' Comments about pace good Moderate pace Normal, hip bothering   Resting Pulse Ox/HR 98% and 73/min 98% and HR 77/min 99% and HR 61/min 98% and HR 70/min  Final Pulse Ox/HR 91% and 91/min 93% and 92.min 94% and  92/min 93% and 98/min  Desaturated </= 88% no no no no  Desaturated <= 3% points yes yes Yes, 5 points Yes, 5 points  Got Tachycardic >/= 90/min yes yes yes yes  Symptoms at end of test none none Hip pain and very mild dyspnea   Miscellaneous comments x x        ROS - per HPI     has a past medical history of Allergy, Arthritis, Asthma, Cataract, Coronary artery calcification seen on CAT scan (08/19/2017), Depression, DOE (dyspnea on exertion), GERD (gastroesophageal reflux disease), H/O steroid therapy, Heart palpitations (01/29/7563), Helicobacter pylori ab+, Hiatal hernia, High cholesterol, History of chronic bronchitis, Hyperlipidemia, mixed (08/19/2017), Hyperplastic colon polyp (2007), Inguinal hernia, Insulin resistance, Interstitial lung disease ([place]), MVP (mitral valve prolapse), Pneumonitis, hypersensitivity ([place]), Pulmonary fibrosis ([place]), Pulmonary HTN ([place]), PVC (premature ventricular contraction) (08/19/2017), and Rapid heart rate.   reports that she has never smoked. She has never used smokeless tobacco.  Past Surgical History:  Procedure Laterality Date   65 HOUR [place] STUDY N/A 02/21/2018   Procedure: 24 HOUR PH STUDY;  Surgeon: Mauri Pole, MD;  Location: [facility];  Service: Endoscopy;  Laterality: N/A;   BREAST BIOPSY Right 2009   BIOPSY   BREAST BIOPSY Left 2003   Benign    CATARACT EXTRACTION Left    CESAREAN SECTION     ESOPHAGEAL MANOMETRY N/A 02/21/2018   Procedure: ESOPHAGEAL MANOMETRY (EM);  Surgeon: Mauri Pole, MD;  Location: [facility];  Service: Endoscopy;  Laterality: N/A;   FOOT FRACTURE SURGERY  2006 or 2007   right   HYMENECTOMY     LUNG BIOPSY  09/28/2012   Procedure: LUNG BIOPSY;  Surgeon: Melrose Nakayama, MD;  Location: [facility];  Service: Thoracic;  Laterality: N/A;  lung biopsies tims three   SQUAMOUS CELL CARCINOMA EXCISION Left    left arm   TOTAL HIP ARTHROPLASTY Right 09/10/2015   Procedure: RIGHT TOTAL HIP  ARTHROPLASTY ANTERIOR APPROACH;  Surgeon: Paralee Cancel, MD;  Location: [facility];  Service: Orthopedics;  Laterality: Right;   TUBAL LIGATION     VIDEO ASSISTED THORACOSCOPY  09/28/2012   Procedure: VIDEO ASSISTED THORACOSCOPY;  Surgeon: Melrose Nakayama, MD;  Location: [facility];  Service: Thoracic;  Laterality: Right;   VIDEO BRONCHOSCOPY  11/19/2011   Procedure: VIDEO BRONCHOSCOPY WITH FLUORO;  Surgeon: Brand Males, MD;  Location: [facility];  Service: Endoscopy;;    Allergies  Allergen Reactions   Atorvastatin Other (See Comments)    Leg pain   Betadine [Povidone Iodine] Other (See Comments)    blisters   Codeine Nausea And Vomiting   Garlic     Severe diarrhea per patient   Onion     Severe diarrhea per patient   Rosuvastatin Other (See Comments)    Leg pain   Shellfish Allergy Other (See Comments)    vomiting   Sulfonamide Derivatives Other (See Comments)    headaches    Immunization History  Administered Date(s) Administered   Hepatitis A 05/23/2010   Hepatitis B 06/23/2006   Influenza Split 08/11/2012, 08/14/2017   Influenza Whole 09/08/2011   Influenza, High Dose Seasonal PF 08/24/2016, 07/24/2017,  08/31/2018   Influenza,inj,Quad PF,6+ Mos 09/07/2013   Influenza-Unspecified 09/26/2014, 07/27/2015   MMR 05/23/2010   Pneumococcal Conjugate-13 03/15/2014   Pneumococcal Polysaccharide-23 01/11/2018   Td 02/22/2003   Tdap 10/28/2012   Zoster 12/04/2010   Zoster Recombinat (Shingrix) 02/15/2017, 05/17/2017    Family History  Problem Relation Age of Onset   Emphysema Maternal Grandmother    Asthma Maternal Grandmother    Asthma Mother    Osteoarthritis Mother    Dementia Mother 36   Lymphoma Father    Diabetes Father    Hypertension Sister    Heart disease Sister    Kidney disease Sister    Lung disease Maternal Grandfather    Bone cancer Paternal Grandfather    Allergic rhinitis Daughter    Breast cancer Paternal  Aunt    Ovarian cancer Maternal Aunt    Breast cancer Cousin    Colon cancer Neg Hx    Angioedema Neg Hx    Eczema Neg Hx    Immunodeficiency Neg Hx    Urticaria Neg Hx      Current Outpatient Medications:    albuterol (PROAIR HFA) 108 (90 Base) MCG/ACT inhaler, Inhale 2 puffs into the lungs every 4 (four) hours as needed for wheezing. Or coughing spells.  You may use 2 Puffs 5-10 minutes before exercise., Disp: 1 Inhaler, Rfl: 3   Alpha-D-Galactosidase (BEANO PO), Take 1 tablet by mouth as needed (if eating onion or garlic). , Disp: , Rfl:    AMBULATORY NON FORMULARY MEDICATION, Take 2 capsules by mouth daily. Cinnsulin cinnamon caplsule 1 by mouth daily , Disp: , Rfl:    Ascorbic Acid (VITAMIN C) 1000 MG tablet, Take 1,000 mg by mouth daily., Disp: , Rfl:    Calcium-Vitamin D-Vitamin K (VIACTIV) 330-076-22 MG-UNT-MCG CHEW, Chew 2 tablets by mouth daily. , Disp: , Rfl:    chlorpheniramine (CHLOR-TRIMETON) 4 MG tablet, Take 4 mg by mouth daily as needed for allergies. , Disp: , Rfl:    dicyclomine (BENTYL) 10 MG capsule, Take 1 capsule (10 mg total) by mouth 2 (two) times daily as needed for spasms., Disp: 60 capsule, Rfl: 1   ezetimibe (ZETIA) 10 MG tablet, Take 1 tablet (10 mg total) by mouth daily. Please keep upcoming appt for future refills. Thank you., Disp: 90 tablet, Rfl: 3   fluconazole (DIFLUCAN) 100 MG tablet, Take 100 mg by mouth as needed. , Disp: , Rfl:    fluticasone (FLONASE) 50 MCG/ACT nasal spray, SPRAY 2 SPRAYS INTO EACH NOSTRIL EVERY DAY, Disp: 16 g, Rfl: 3   fluticasone furoate-vilanterol (BREO ELLIPTA) 100-25 MCG/INH AEPB, TAKE 1 PUFF BY MOUTH EVERY DAY, Disp: 180 each, Rfl: 1   metformin (FORTAMET) 500 MG (OSM) 24 hr tablet, Take 500 mg by mouth daily. With largest meal of the day., Disp: , Rfl:    metoprolol succinate (TOPROL-XL) 50 MG 24 hr tablet, Take 25 mg by mouth daily. Take one half tablet by mouth daily with or immediately following a meal.  , Disp: , Rfl:    montelukast (SINGULAIR) 10 MG tablet, TAKE 1 TABLET BY MOUTH EVERYDAY AT BEDTIME, Disp: 90 tablet, Rfl: 2   Multiple Vitamin (MULTIVITAMIN) tablet, Take 1 tablet by mouth daily., Disp: , Rfl:    Naproxen Sodium (ALEVE) 220 MG CAPS, Take 1-2 capsules by mouth as needed., Disp: , Rfl:    omeprazole (PRILOSEC) 20 MG capsule, Take 1 capsule (20 mg total) by mouth daily. (Patient taking differently: Take 10 mg by mouth daily. Patient  takes 10 mg), Disp: 30 capsule, Rfl: 5   pravastatin (PRAVACHOL) 40 MG tablet, Take 40 mg by mouth daily., Disp: , Rfl:    predniSONE (DELTASONE) 10 MG tablet, TAKE 1 TABLET (10 MG TOTAL) BY MOUTH DAILY WITH BREAKFAST. (Patient taking differently: Take 10 mg by mouth daily with breakfast. Takes 1/2 tablet daily), Disp: 90 tablet, Rfl: 1   Probiotic Product (PROBIOTIC ADVANCED PO), Take 2 capsules by mouth daily. , Disp: , Rfl:    Turmeric 500 MG CAPS, Take 1 capsule by mouth daily., Disp: , Rfl:    acetaminophen (TYLENOL) 325 MG tablet, Take 650 mg by mouth every 6 (six) hours as needed. Reported on 02/10/2016, Disp: , Rfl:    gabapentin (NEURONTIN) 300 MG capsule, TAKE 1 CAPSULE BY MOUTH EVERYDAY AT BEDTIME (Patient not taking: Reported on 05/09/2019), Disp: 90 capsule, Rfl: 2   nitroGLYCERIN (NITROSTAT) 0.4 MG SL tablet, Place 1 tablet (0.4 mg total) under the tongue every 5 (five) minutes as needed for chest pain., Disp: 25 tablet, Rfl: 12      Objective:   Vitals:   05/09/19 1505  BP: 124/68  Pulse: 71  Temp: 98 F (36.7 C)  SpO2: 96%  Weight: 148 lb 3.2 oz (67.2 kg)  Height: '4\' 11"'  (1.499 m)    Estimated body mass index is 29.93 kg/m as calculated from the following:   Height as of this encounter: '4\' 11"'  (1.499 m).   Weight as of this encounter: 148 lb 3.2 oz (67.2 kg).  '@WEIGHTCHANGE' @  [employer]   05/09/19 1505  Weight: 148 lb 3.2 oz (67.2 kg)     Physical Exam  General Appearance:    Alert, cooperative, no  distress, appears stated age - yes , Deconditioned looking - no , OBESE  - no, Sitting on Wheelchair -  no  Head:    Normocephalic, without obvious abnormality, atraumatic  Eyes:    PERRL, conjunctiva/corneas clear,  Ears:    Normal TM's and external ear canals, both ears  Nose:   Nares normal, septum midline, mucosa normal, no drainage    or sinus tenderness. OXYGEN ON  - no . Patient is @ ra   Throat:   Lips, mucosa, and tongue normal; teeth and gums normal. Cyanosis on lips - no  Neck:   Supple, symmetrical, trachea midline, no adenopathy;    thyroid:  no enlargement/tenderness/nodules; no carotid   bruit or JVD  Back:     Symmetric, no curvature, ROM normal, no CVA tenderness  Lungs:     Distress - no , Wheeze no, Barrell Chest - no, Purse lip breathing - no, Crackles - yes at lung base   Chest Wall:    No tenderness or deformity.    Heart:    Regular rate and rhythm, S1 and S2 normal, no rub   or gallop, Murmur - no  Breast Exam:    NOT DONE  Abdomen:     Soft, non-tender, bowel sounds active all four quadrants,    no masses, no organomegaly. Visceral obesity - no  Genitalia:   NOT DONE  Rectal:   NOT DONE  Extremities:   Extremities - normal, Has Cane - no, Clubbing - no, Edema - no  Pulses:   2+ and symmetric all extremities  Skin:   Stigmata of Connective Tissue Disease - no  Lymph nodes:   Cervical, supraclavicular, and axillary nodes normal  Psychiatric:  Neurologic:   Pleasant - yes, Anxious - no, Flat affect -  no  CAm-ICU - neg, Alert and Oriented x 3 - yes, Moves all 4s - yes, Speech - normal, Cognition - intact           Assessment:       ICD-10-CM   1. ILD (interstitial lung disease) (HCC)  J84.9 Pulmonary function test  2. 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 oil pain and ? mold in house  J67.9   3. PVC (premature ventricular contraction)  I49.3   4. Right-sided chest wall pain  R07.89        Plan:     Patient Instructions     ICD-10-CM    1. ILD (interstitial lung disease) ([place])  J84.9   2. 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 oil pain and ? mold in house  J67.9   3. PVC (premature ventricular contraction)  I49.3   4. Right-sided chest wall pain  R07.89    ILD (interstitial lung disease) (HCC) - STable symptoms and walk test. Plan: continue prednisone. Await PFT test result tomorrow to decide on ofev    PVC (premature ventricular contraction) - Plan: I think ok to get holter and probably should  Right-sided chest wall pain - Plan: supportive care  Followup - pft tomorrow  - routine visit to ILD clinic in 4 months or sooner if needed     > 50% of this > 25 min visit spent in face to face counseling or coordination of care - by this undersigned MD - Dr Brand Males. This includes one or more of the following documented above: discussion of test results, diagnostic or treatment recommendations, prognosis, risks and benefits of management options, instructions, education, compliance or risk-factor reduction   SIGNATURE    Dr. Brand Males, M.D., F.C.C.P,  Pulmonary and Critical Care Medicine Staff Physician, [place] Director - Interstitial Lung Disease  Program  Pulmonary [place] at [place], [place], [address]  Pager: [phone], If no answer or between  15:00h - 7:00h: call [phone] Telephone: [phone]  4:04 PM 05/09/2019

## 2019-05-09 NOTE — Patient Instructions (Signed)
ICD-10-CM   1. ILD (interstitial lung disease) ([place])  J84.9   2. 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 oil pain and ? mold in house  J67.9   3. PVC (premature ventricular contraction)  I49.3   4. Right-sided chest wall pain  R07.89    ILD (interstitial lung disease) (HCC) - STable symptoms and walk test. Plan: continue prednisone. Await PFT test result tomorrow to decide on ofev    PVC (premature ventricular contraction) - Plan: I think ok to get holter and probably should  Right-sided chest wall pain - Plan: supportive care  Followup - pft tomorrow  - routine visit to ILD clinic in 4 months or sooner if needed

## 2019-05-10 ENCOUNTER — Ambulatory Visit (INDEPENDENT_AMBULATORY_CARE_PROVIDER_SITE_OTHER): Payer: Medicare Other | Admitting: Internal Medicine

## 2019-05-10 ENCOUNTER — Ambulatory Visit: Payer: Medicare Other | Admitting: Primary Care

## 2019-05-10 DIAGNOSIS — J849 Interstitial pulmonary disease, unspecified: Secondary | ICD-10-CM | POA: Diagnosis not present

## 2019-05-10 LAB — PULMONARY FUNCTION TEST
DL/VA % pred: 93 %
DL/VA: 4.01 ml/min/mmHg/L
DLCO unc % pred: 63 %
DLCO unc: 10.32 ml/min/mmHg
FEF 25-75 Pre: 2.31 L/sec
FEF2575-%Pred-Pre: 142 %
FEV1-%Pred-Pre: 80 %
FEV1-Pre: 1.45 L
FEV1FVC-%Pred-Pre: 117 %
FEV6-%Pred-Pre: 70 %
FEV6-Pre: 1.62 L
FEV6FVC-%Pred-Pre: 105 %
FVC-%Pred-Pre: 67 %
FVC-Pre: 1.62 L
Pre FEV1/FVC ratio: 89 %
Pre FEV6/FVC Ratio: 100 %

## 2019-05-10 NOTE — Progress Notes (Signed)
Spirometry and dlco done today. 

## 2019-05-12 ENCOUNTER — Telehealth: Payer: Self-pay | Admitting: Internal Medicine

## 2019-05-12 DIAGNOSIS — J849 Interstitial pulmonary disease, unspecified: Secondary | ICD-10-CM

## 2019-05-12 NOTE — Telephone Encounter (Signed)
Rhonda [name] - let her know PFT stable x 1 year. She can get back to me directly if she wants to wait on ofev or start.   followup  3-6 months - she can choose the range - spiro / dlco and clinic

## 2019-05-12 NOTE — Telephone Encounter (Signed)
pft stable x 1 year  Plan  - she can text me directly about doing ofev or holding  - 6 months do spiro/dlco      SIGNATURE    Dr. Brand Males, M.D., F.C.C.P,  Pulmonary and Critical Care Medicine Staff Physician, [place] Director - Interstitial Lung Disease  Program  Pulmonary [place] at [place], [place], [address]  Pager: [phone], If no answer or between  15:00h - 7:00h: call [phone] Telephone: [phone]  9:53 AM 05/12/2019

## 2019-05-15 NOTE — Telephone Encounter (Signed)
Information from other encounter by MR:  Rhonda [name] - let her know PFT stable x 1 year. She can get back to me directly if she wants to wait on ofev or start.   followup  3-6 months - she can choose the range - spiro / dlco and clinic  Called and spoke with pt letting her know the results of PFT per MR and stated to her that he said she could text him if she wanted to go ahead and begin [place] or hold off on it and pt stated she does want to hold off on beginning it at this time.  Stated to pt we would have her follow up in 6 months and that I was going to place a recall in her chart in regards to this and pt verbalized understanding. Order has been placed for pt to have PFT repeated in 6 months and recall for appt has been placed as well. Nothing further needed.

## 2019-05-15 NOTE — Telephone Encounter (Signed)
Please see other open encounter as it is a duplicate encounter.

## 2019-06-23 ENCOUNTER — Other Ambulatory Visit: Payer: Self-pay | Admitting: Internal Medicine

## 2019-06-23 ENCOUNTER — Other Ambulatory Visit: Payer: Self-pay

## 2019-06-29 DIAGNOSIS — N760 Acute vaginitis: Secondary | ICD-10-CM | POA: Diagnosis not present

## 2019-06-29 DIAGNOSIS — L249 Irritant contact dermatitis, unspecified cause: Secondary | ICD-10-CM | POA: Diagnosis not present

## 2019-06-29 DIAGNOSIS — E785 Hyperlipidemia, unspecified: Secondary | ICD-10-CM | POA: Diagnosis not present

## 2019-06-29 DIAGNOSIS — M5410 Radiculopathy, site unspecified: Secondary | ICD-10-CM | POA: Diagnosis not present

## 2019-06-30 DIAGNOSIS — H00011 Hordeolum externum right upper eyelid: Secondary | ICD-10-CM | POA: Diagnosis not present

## 2019-07-14 NOTE — Telephone Encounter (Signed)
Spoke with pt. She has been scheduled for her flu shot on 07/17/2019 at 1130.

## 2019-07-17 ENCOUNTER — Other Ambulatory Visit: Payer: Self-pay

## 2019-07-17 ENCOUNTER — Ambulatory Visit (INDEPENDENT_AMBULATORY_CARE_PROVIDER_SITE_OTHER): Payer: Medicare Other

## 2019-07-17 DIAGNOSIS — Z23 Encounter for immunization: Secondary | ICD-10-CM | POA: Diagnosis not present

## 2019-07-17 NOTE — Telephone Encounter (Signed)
1. Pls give high dose flu sho to [employer]  when this is avaialble  2. Re urin prot check - I do not remember needing this for any reason

## 2019-08-08 DIAGNOSIS — L304 Erythema intertrigo: Secondary | ICD-10-CM | POA: Diagnosis not present

## 2019-08-08 DIAGNOSIS — D225 Melanocytic nevi of trunk: Secondary | ICD-10-CM | POA: Diagnosis not present

## 2019-08-08 DIAGNOSIS — L821 Other seborrheic keratosis: Secondary | ICD-10-CM | POA: Diagnosis not present

## 2019-08-08 DIAGNOSIS — L814 Other melanin hyperpigmentation: Secondary | ICD-10-CM | POA: Diagnosis not present

## 2019-08-08 DIAGNOSIS — L817 Pigmented purpuric dermatosis: Secondary | ICD-10-CM | POA: Diagnosis not present

## 2019-08-08 DIAGNOSIS — Z85828 Personal history of other malignant neoplasm of skin: Secondary | ICD-10-CM | POA: Diagnosis not present

## 2019-08-08 DIAGNOSIS — L570 Actinic keratosis: Secondary | ICD-10-CM | POA: Diagnosis not present

## 2019-08-08 DIAGNOSIS — L820 Inflamed seborrheic keratosis: Secondary | ICD-10-CM | POA: Diagnosis not present

## 2019-08-08 DIAGNOSIS — D239 Other benign neoplasm of skin, unspecified: Secondary | ICD-10-CM | POA: Diagnosis not present

## 2019-08-08 DIAGNOSIS — D485 Neoplasm of uncertain behavior of skin: Secondary | ICD-10-CM | POA: Diagnosis not present

## 2019-08-20 DIAGNOSIS — M25552 Pain in left hip: Secondary | ICD-10-CM | POA: Insufficient documentation

## 2019-08-21 DIAGNOSIS — M1612 Unilateral primary osteoarthritis, left hip: Secondary | ICD-10-CM | POA: Diagnosis not present

## 2019-08-21 DIAGNOSIS — Z96641 Presence of right artificial hip joint: Secondary | ICD-10-CM | POA: Diagnosis not present

## 2019-08-21 DIAGNOSIS — M25552 Pain in left hip: Secondary | ICD-10-CM | POA: Diagnosis not present

## 2019-08-22 ENCOUNTER — Encounter: Payer: Self-pay | Admitting: Internal Medicine

## 2019-08-22 ENCOUNTER — Other Ambulatory Visit: Payer: Self-pay

## 2019-08-22 ENCOUNTER — Ambulatory Visit (INDEPENDENT_AMBULATORY_CARE_PROVIDER_SITE_OTHER): Payer: Medicare Other | Admitting: Internal Medicine

## 2019-08-22 VITALS — BP 134/66 | HR 81 | Ht 59.0 in | Wt 146.0 lb

## 2019-08-22 DIAGNOSIS — Z01811 Encounter for preprocedural respiratory examination: Secondary | ICD-10-CM

## 2019-08-22 DIAGNOSIS — I2510 Atherosclerotic heart disease of native coronary artery without angina pectoris: Secondary | ICD-10-CM | POA: Diagnosis not present

## 2019-08-22 DIAGNOSIS — J849 Interstitial pulmonary disease, unspecified: Secondary | ICD-10-CM | POA: Diagnosis not present

## 2019-08-22 DIAGNOSIS — J679 Hypersensitivity pneumonitis due to unspecified organic dust: Secondary | ICD-10-CM

## 2019-08-22 NOTE — Progress Notes (Signed)
#GE reflux with small hiatal hernia  - on ppi   #History of rapid heart rate not otherwise specified  - Start her on Lopressor  2008/2009 by primary care physician. History appears to correlate with onset of respiratory issues  - refuses to dc this drug as of 2014 discussion   # Hypersensitivity Pneumonitis and ILD  - Potential etiologies - cockateil x 2 for 18 years through end 2012. 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 Denies mold but lives in house built in [place] with a "weird baselment: and has humidifier  - first noted on CT chest 10/15/09 following trip to [place] (PE negative) but not described in 2003 CT chest report  - autoimmune panel: 10/23/11: Negative  -  Uderwennt bronch 11/19/11  - non diagnostic  - VATS Nov 2013 (done after initially refusing)- [place]. However, there is worrying trend of UIP pattern in the Upper lobes.            Oct/Nov 2012  March 2013 08/11/12  Nov/Dec 2013 Dx HP/UIP 01/02/2013  02/28/13 05/25/2013  12/06/2013  03/15/2014  06/29/2014  10'27/15 12/19/2014  02/26/15 01/13/2016  04/29/2016  10/23/2016  01/25/2017  06/17/2017 [facility] July 2018 01/11/2018  04/13/2018  08/16/2018   Symp/Signs Dyspnea x5y  New crackles  dimished dyspnea except at hill. Improved crackles.  Improved dyspnea except @ hill                 FVC  2.6 L    2.2 L/82%   2.1 L/80%   2.19 L/82%  2.2L/91% 2.1/81% 1.86/69% 1.9L/71% 1.86/75% 1.75L/65% 1.82/73% (dec 2015 offce spiro) 2.84L/72% 1.54/61% 1.78L/67%  1.69L/65%  1.61/62% 1.35/55% 1.65/67%  DLCO  10/50%    10/51%   9.6/51%     11.9/63% 11.7/62%  11.561%  5.5/31% 10.06/57% 11/04/63%  10.43/55% 8.56/50% 10.57/61% 10.16/57% 9.21/52%  Walk test 185 ft x 3 laps on RA  rest 92%. Pk exertion 90%. Pk HR 108 Rest 92%. Pk exertion 86% at 2.5 laps. Pk HR 102  pk exertion - 88% Rest 96%. Pk 95%. Pk HR is 75. Done at full dose  lopressor Rest 94%. Pk 91%. Rest HR 93 with pk HR 103/min. Done at half dose lopressor REst 96%. Pk pulse ox 92%. Rest HR 65. Pk HR 86 Did not desat. Normal Pk HR  Pulse ox 96%, droped to89% at 2nd lap but bounced up and ended at 91% 3rd lap Did not desat Did not desat, Pk HHR 93  ;llowest pulse ox 90% at 2nd lap but she held her breath, then 92% at 3rd lap 99% -> 91% at end of 3rd lap 94%, Dropped to 87% at 3rd lap, HR peak102/m 97%, dropped to 89% in 3rd lap - asymptomatic. HR 100/min No prob with 54md at [facility] 99% rest - -> dopped to 92% 3rd lap. Pkr HR 107/min 98% rest -> 91% 3rd lap with Pk HR 91/min   CT chest   unchanged          yes Ct mid dec possibly worse - unsure Ct - similar to April 2016 but worse since 2013   CT 06/24/17 - similar to June 2017     Tests/Bx Bronch 0- non diagnostic   VATS - HP with UIP in Upper lobe                   RX  Rx pred start 11/23/12 On  pred 59m per day and will reduce to 237mOn Pred 2079mer day On pred 38m60mr day On pred 5mg 37m day Rx pred 5mg d16my She reduced to pred 5mg x 75mt 1 month QOD due to GI concern side effects On pred 5mg qod58mt will increase to 5/d after this visit On pred 5mg dail4m On pred 5mg daily38mncrease pred to 38mg per d73meduce to pred 7.5 due to skin sissues  pred burs and reduce to 38mg per da16m                         OV 06/17/2017  Chief Complaint  Patient presents with   Follow-up    [name]. Pt denies significant cough, denies CP/tightness, f/c/s.     She is better. Feels she does not need o2. Has been to [facility] for transplant clinic; felt too early. Seems higher dose prednisone helping but then she is also bettter t his time of the year. In May 2018 I was at [facility] and curbsided national thought leaders - feel that we could explore silent active GERD as a possibility of ILD getting wors with time.   OV 01/11/2018  Chief Complaint  Patient presents with   Follow-up    PFT done today.  Pt  states she has been coughing x2 weeks since she returned from a trip to [place]. FroDelawareing, pt has had pain in ribs. Pt states she has mild SOB which is stable due to rehab.  Pt states that Dr. Richter is wDarron Doomto know if pt could possibly have eosinophilic esophagitis.   Went to [place]. RetDelawarend has been coughing x 2 weeks. Associated with this is some rib pain on right side. She feels she does not need o2. She feels she does not need pred/abx at this time. She also exposed to flu wit family   OV 02/08/2018  Chief Complaint  Patient presents with   Acute Visit    CXR done 02/07/18.  Pt states her throat is sore but not as bad as it was yesterday when in office and does have some left ear pain. Pt states she has some pain on right rib cage and has increased fatigue.     Rhonda [name] preGauutely.  I last saw her one month ago gave her Tamiflu for prophylaxis after flu exposure.  At that point in time she was already reducing her chronic stable prednisone of 10 mg for interstitial lung disease/hypersensitivity pneumonitis.  She had tapered herself slowly 3 or 4 weeks ago to 5 mg.  She says she was doing fairly okay but in the last 2 or 3 weeks she has had increased fatigue.  She says she goes daily swimming and then feels energized but then an hour later starts having fatigue which is more than usual.  Also in the daytime she has random fatigue.  In terms of her respiratory symptoms these are stable and in fact slightly better in terms of shortness of breath and cough but she is having sore throat without any fever and some left-sided otalgia.  In addition she is having right-sided chest pains that are more than usual.  The chest pains have been reported before.  These are specifically at localized spots along the previous incision several years ago for interstitial lung disease.  They are tender as well  to touch.  This pain is worse.  In addition she is also being bothered by arthralgia  in her hand and left hip.  The left hip arthralgias old with a hand arthralgias new.  She is worried about pneumothorax recurrence and so she had a chest x-ray yesterday that shows no pneumothorax.  She has stable interstitial lung disease changes but in my personal visualization these interstitial lung disease changes are worse compared to several years ago.  We do know that she has slowly worsening interstitial lung disease.  In terms of a walking desaturation test this shows stability since last visit.  She has seen GI for acid reflux and has pH probe study coming up  In talking to her she tells me she has been on metformin for over a month or 2.  This is new and is meant for pre-diabetes.  Apparently her hemoglobin A1c is always below 6 but is above 5.5.  Walking desaturation test on 02/08/2018 185 feet x 3 laps on ROOM AIR:  did walk normal pace with forehead probe desaturate. Rest pulse ox was 99%, final pulse ox was 93%. HR response 72/min at rest to 93/min at peak exertion. [name]  Did not Desaturate < 88% . Rhonda [name] yes did  Desaturated </= 3% points. Rhonda [name] yes did get tachyardic   Dg Chest 2 View  Result Date: 02/07/2018 CLINICAL DATA:  Chest wall pain for 2 weeks EXAM: CHEST - 2 VIEW COMPARISON:  CT 08/20/2017, radiograph 10/11/2015 FINDINGS: Coarse interstitial pattern consistent with pulmonary fibrosis, similar distribution compared to prior. No focal pulmonary opacity or pleural effusion. Stable cardiomediastinal silhouette with aortic atherosclerosis. No pneumothorax. Degenerative changes of the spine. IMPRESSION: No active cardiopulmonary disease. Similar appearance of diffuse pulmonary fibrosis. Negative for a pneumothorax. Electronically Signed   By: Donavan Foil M.D.   On: 02/07/2018 14:26    OV 04/13/2018  Chief Complaint  Patient presents with   Follow-up    Pt had pre spiro and DLCO PFT prior to OV. Pt has increase of productive  cough-thick white in last week. Pt has some wheezing, and allergy issues.   Ms. Freda Munro presents for routine follow-up.  I saw her acutely just approximately 2 months ago.  Then end of April 2019 she saw a nurse practitioner again acutely and was given antibiotic and prednisone.  She says she felt better after that but in the last week or so due to increased pollen load in the community and also moving furniture because her daughter is relocating out of her home she started having more cough.  There is no change in her dyspnea with the cough is really bad.  This is despite Singulair and Chlor-Trimeton.  Those things to help but just take the edge off.  She feels that the pollen making the cough worse.  There is no change in dyspnea.  Walking desaturation test documented below his baseline.  She had pulmonary function test today but the [facility] shows significant decline compared to February 2019 and she feels surprised by this.  DLCO is baseline unchanged and a walking desaturation test limited in the office is also unchanged.  She does not have any fever but has significant postnasal drip.  She has not followed up at [facility] transplant clinic or the allergist recently.  OV 08/16/2018  Subjective:  Patient ID: Rhonda [name], female , DOB: 1951-02-10 , age 1 y.o. , MRN: [id number] , ADDRESS: [address]  [place] [address]   08/16/2018 -   Chief Complaint  Patient presents with   Follow-up    PFT performed today.  Pt states she has had a lot of mucus the month of September 2019 so she has been taking the Chlor-Trimeton. States other than that she has been able to do a lot more activities and has been swimming a lot more. Pt states she believes the SOB is stable.     HPI Rhonda [name] 68 y.o. -continues to do well.  She is taking higher dose of prednisone 10 mg/day.  Also the summer usually she is doing well.  She is not having much of a cough.  Lung function test shows improvement  compared to tests done earlier this year.  In fact she is almost as good as much 2018.  Still compared to 2014-2016 she has progressive lung disease.  Walking desaturation test is stable.  She needs a high-dose flu shot but we do not have stock today.  She has lost some weight intentionally with better dietary control.  She is exercising regularly swimming.  We discussed ofev  potential future therapy if the drug is approved for this indication.  We will know study results in a year or so.  She is open to the idea but is worried somewhat about her irritable bowel syndrome flaring up.  She is no longer following at the [facility] lung transplant clinic given stability lung function  Other issues: She always has right-sided chest wall pain to the site of lung biopsy.  The only way this is been resolved as by her limiting her use of bra .  Also her irritable bowel syndrome is under control but in case it flares up she wants a refill of dicyclomine     OV 12/15/2018  Subjective:  Patient ID: Rhonda [name], female , DOB: [birth year] , age 25 y.o. , MRN: [id number] , ADDRESS: [address] [place] [address]   12/15/2018 -   Chief Complaint  Patient presents with   Follow-up    Pt states she has been doing better since last visit with TP but states she has been having pain in her rib cage x2 years but states it has become more intense.     HPI Rhonda [name] 68 y.o. -returns for follow-up of her ILD due to hypersensitivity pneumonitis.  She did a clinic visit also for research with the ILD-pro registry study.  Since I last saw her in September 2019 she had a flareup and required higher doses of prednisone.  Since then she is returned to baseline of 5 mg prednisone and she feels good.  She is stable.  Walking desaturation test shows stability.  We have been discussing over the last few months about taking nintedanib based on new data and progressive non--IPF ILD's.  She has trepidation for  this because of irritable bowel syndrome.  After much discussion she is willing to try 100 mg once daily for a month and then escalate to 100 mg twice daily which is the therapeutic dose.  She will do this once after insurance approval which we suspect will happen over the next few months.  Her main issue is that her right sided infra-axillary chest pain is getting worse.  This started after her surgical lung biopsy and pneumothorax on the right side.  It is random and happens with twisting.  But now days it happens once every few days.  Previously it used to happen  once every few weeks.  Sometimes it is excruciating but then she is left in pain for a few days.  Applying Abrol sudden twisting of her body makes it worse.  Is a lancinating pain.  She did try gabapentin some years ago for chronic cough .  She did tolerate the gabapentin well.  She does not remember if it helped the pain or not but clearly back and the pain was much less intense.  She is willing to try this again.  I recommended a CT scan of the chest but she is going to have radiation for a CT coronary angiogram because of high levels of coronary artery calcification.  Therefore written to the radiologist to see if he can look at the lung parenchyma with a CT angiogram    OV 05/09/2019  Subjective:  Patient ID: Rhonda [name], female , DOB: 27-May-1951 , age 32 y.o. , MRN: [id number] , ADDRESS: [address] [place] [address]   05/09/2019 -   Chief Complaint  Patient presents with   ILD (Interstitial Lung Disease)     HPI Rhonda [name] 68 y.o. -follow-up for chronic care physician pneumonitis on daily 5 mg prednisone.  She has problems with right-sided chest wall pain that is neuropathic.  She tells me since her last visit January 2020 she has been isolating and social distancing because of the pandemic.  Overall she is been stable.  Her symptom scores are mild and documented below.  She continues with prednisone.  She was  supposed to have pulmonary function test but this was held because of the pandemic.  Her walking desaturation test shows she is stable.  She is supposed to have pulmonary function test tomorrow but wanted to see me today.  In the interim she did have a cardiac CT scan of the chest that shows 94th percentile of coronary calcium.  She could not get a CT angiogram done because of PVC.  A nuclear medicine stress test was done and I reviewed this result and it was normal.  A Holter test is pending.  She is kind of nervous because of the ongoing COVID-19 situation and safety of doing a Holter test.  In terms of her right chest wall pain she is adapted to it.  She does not use any bra  OV 08/22/2019  Subjective:  Patient ID: Rhonda [name], female , DOB: Jul 30, 1951 , age 29 y.o. , MRN: [id number] , AD[address]ESS: [address] [place] [address]   08/22/2019 -   Chief Complaint  Patient presents with   Follow-up    needs sx clearance for L hip replacement- not yet scheduled, pending clearance.     Patient lung disease chronic hypersensitive pneumonitis on chronic daily prednisone 5 mg/day  HPI Rhonda [name] 68 y.o. -presents for follow-up of her interstitial lung disease due to chronic hypersensitive pneumonitis.  After last visit in June 2020 given her PFT stability and also her aversion to the potential side effects with nintedanib she decided to just continue with prednisone daily 5 mg/day.  Given the pandemic she is walking 1 mile a day without stopping.  Her interstitial lung disease symptom score is actually slightly better.  Overall she reports stability in her interstitial lung disease.  She has a new issue of getting preoperative clearance for her left hip.  She says the left hip is bone-on-bone and she does not want to take Aleve.  She prefers to have surgery.  Dr. Paralee Cancel is  the surgeon.  Surgery date has not been set.  She is very functional.  She has normal renal function.  Normal  nutritional status.  No anemia.  No respiratory infections in the last 1 month.  She is not on oxygen.  Age 64.  The surgeries in the left hip and the duration of surgery is under 3 hours.  Anesthesia will be general.    SYMPTOM SCALE - ILD 05/09/2019  08/22/2019   O2 use ra ra  Shortness of Breath 0 -> 5 scale with 5 being worst (score 6 If unable to do) Walks mile in pandemic without stopping  At rest 0 0  Simple tasks - showers, clothes change, eating, shaving 0 0  Household (dishes, doing bed, laundry) 1 1  Shopping 1.5 0  Walking level at own pace 2 1  Walking keeping up with others of same age 76 2  Walking up Stairs 3 2  Walking up Hill 3 3  Total (40 - 48) Dyspnea Score 12 9  How bad is your cough? 1 1  How bad is your fatigue 0 1    Results for Matera, MARYFRANCES A (MRN [id number]) as of 08/22/2019 11:56  Ref. Range 12/06/2013 13:03 03/15/2014 15:56 09/18/2014 11:41 02/26/2015 09:02 12/16/2015 16:31 04/29/2016 12:44 01/25/2017 10:16 01/11/2018 15:19 04/13/2018 08:47 08/16/2018 09:00 05/10/2019 09:42  FVC-Pre Latest Units: L 1.86 1.91 1.73 1.84 1.42  1.69 1.61 1.35 1.65 1.62  FVC-%Pred-Pre Latest Units: % '69 71 64 72 56 67 65 62 55 67 ' 67   Results for Hanaway, Taraneh A (MRN [id number]) as of 08/22/2019 11:56  Ref. Range 12/06/2013 13:03 03/15/2014 15:56 09/18/2014 11:41 02/26/2015 09:02 12/16/2015 16:31 04/29/2016 12:44 01/25/2017 10:16 01/11/2018 15:19 04/13/2018 08:47 08/16/2018 09:00 05/10/2019 09:42  DLCO unc Latest Units: ml/min/mmHg 11.99 11.70 11.53 5.52 10.06 12.12 10.43 10.57 10.16 9.21 10.32  DLCO unc % pred Latest Units: % '63 62 61 31 57 64 55 56 57 52 ' 63    Simple office walk 185 feet x  3 laps goal with forehead probe 04/13/2018  08/16/2018  12/15/2018  05/09/2019  08/22/2019   O2 used Room air Room air Room air Room air Room air  Number laps completed '3 3 3 3 2 ' stopped at 2 die to hip pain  Comments about pace good Moderate pace Normal, hip bothering    Resting Pulse Ox/HR 98% and 73/min 98%  and HR 77/min 99% and HR 61/min 98% and HR 70/min 98%  Final Pulse Ox/HR 91% and 91/min 93% and 92.min 94% and 92/min 93% and 98/min 91%   Desaturated </= 88% no no no no   Desaturated <= 3% points yes yes Yes, 5 points Yes, 5 points   Got Tachycardic >/= 90/min yes yes yes yes   Symptoms at end of test none none Hip pain and very mild dyspnea  Stopped due to hip pain  Miscellaneous comments x x        ROS - per HPI     has a past medical history of Allergy, Arthritis, Asthma, Cataract, Coronary artery calcification seen on CAT scan (08/19/2017), Depression, DOE (dyspnea on exertion), GERD (gastroesophageal reflux disease), H/O steroid therapy, Heart palpitations (11/30/2881), Helicobacter pylori ab+, Hiatal hernia, High cholesterol, History of chronic bronchitis, Hyperlipidemia, mixed (08/19/2017), Hyperplastic colon polyp (2007), Inguinal hernia, Insulin resistance, Interstitial lung disease ([place]), MVP (mitral valve prolapse), Pneumonitis, hypersensitivity ([place]), Pulmonary fibrosis ([place]), PVC (premature ventricular contraction) (08/19/2017), and Rapid heart rate.   reports that she has never  smoked. She has never used smokeless tobacco.  Past Surgical History:  Procedure Laterality Date   87 HOUR [place] STUDY N/A 02/21/2018   Procedure: 24 HOUR PH STUDY;  Surgeon: Mauri Pole, MD;  Location: [facility];  Service: Endoscopy;  Laterality: N/A;   BREAST BIOPSY Right 2009   BIOPSY   BREAST BIOPSY Left 2003   Benign    CATARACT EXTRACTION Left    CESAREAN SECTION     ESOPHAGEAL MANOMETRY N/A 02/21/2018   Procedure: ESOPHAGEAL MANOMETRY (EM);  Surgeon: Mauri Pole, MD;  Location: [facility];  Service: Endoscopy;  Laterality: N/A;   FOOT FRACTURE SURGERY  2006 or 2007   right   HYMENECTOMY     LUNG BIOPSY  09/28/2012   Procedure: LUNG BIOPSY;  Surgeon: Melrose Nakayama, MD;  Location: [facility];  Service: Thoracic;  Laterality: N/A;  lung biopsies tims three   SQUAMOUS  CELL CARCINOMA EXCISION Left    left arm   TOTAL HIP ARTHROPLASTY Right 09/10/2015   Procedure: RIGHT TOTAL HIP ARTHROPLASTY ANTERIOR APPROACH;  Surgeon: Paralee Cancel, MD;  Location: [facility];  Service: Orthopedics;  Laterality: Right;   TUBAL LIGATION     VIDEO ASSISTED THORACOSCOPY  09/28/2012   Procedure: VIDEO ASSISTED THORACOSCOPY;  Surgeon: Melrose Nakayama, MD;  Location: [facility] OR;  Service: Thoracic;  Laterality: Right;   VIDEO BRONCHOSCOPY  11/19/2011   Procedure: VIDEO BRONCHOSCOPY WITH FLUORO;  Surgeon: Brand Males, MD;  Location: [facility];  Service: Endoscopy;;    Allergies  Allergen Reactions   Atorvastatin Other (See Comments)    Leg pain   Betadine [Povidone Iodine] Other (See Comments)    blisters   Codeine Nausea And Vomiting   Garlic     Severe diarrhea per patient   Onion     Severe diarrhea per patient   Rosuvastatin Other (See Comments)    Leg pain   Shellfish Allergy Other (See Comments)    vomiting   Sulfonamide Derivatives Other (See Comments)    headaches    Immunization History  Administered Date(s) Administered   Fluad Quad(high Dose 65+) 07/17/2019   Hepatitis A 05/23/2010   Hepatitis B 06/23/2006   Influenza Split 08/11/2012, 08/14/2017   Influenza Whole 09/08/2011   Influenza, High Dose Seasonal PF 08/24/2016, 07/24/2017, 08/31/2018   Influenza,inj,Quad PF,6+ Mos 09/07/2013   Influenza-Unspecified 09/26/2014, 07/27/2015   MMR 05/23/2010   Pneumococcal Conjugate-13 03/15/2014   Pneumococcal Polysaccharide-23 01/11/2018   Td 02/22/2003   Tdap 10/28/2012   Zoster 12/04/2010   Zoster Recombinat (Shingrix) 02/15/2017, 05/17/2017    Family History  Problem Relation Age of Onset   Emphysema Maternal Grandmother    Asthma Maternal Grandmother    Asthma Mother    Osteoarthritis Mother    Dementia Mother 18   Lymphoma Father    Diabetes Father    Hypertension Sister    Heart disease Sister     Kidney disease Sister    Lung disease Maternal Grandfather    Bone cancer Paternal Grandfather    Allergic rhinitis Daughter    Breast cancer Paternal Aunt    Ovarian cancer Maternal Aunt    Breast cancer Cousin    Colon cancer Neg Hx    Angioedema Neg Hx    Eczema Neg Hx    Immunodeficiency Neg Hx    Urticaria Neg Hx      Current Outpatient Medications:    albuterol (PROAIR HFA) 108 (90 Base) MCG/ACT inhaler, Inhale 2 puffs into the  lungs every 4 (four) hours as needed for wheezing. Or coughing spells.  You may use 2 Puffs 5-10 minutes before exercise., Disp: 1 Inhaler, Rfl: 3   Alpha-D-Galactosidase (BEANO PO), Take 1 tablet by mouth as needed (if eating onion or garlic). , Disp: , Rfl:    Ascorbic Acid (VITAMIN C) 1000 MG tablet, Take 1,000 mg by mouth daily., Disp: , Rfl:    Calcium-Vitamin D-Vitamin K (VIACTIV) 350-093-81 MG-UNT-MCG CHEW, Chew 2 tablets by mouth daily. , Disp: , Rfl:    chlorpheniramine (CHLOR-TRIMETON) 4 MG tablet, Take 4 mg by mouth daily as needed for allergies. , Disp: , Rfl:    dicyclomine (BENTYL) 10 MG capsule, Take 1 capsule (10 mg total) by mouth 2 (two) times daily as needed for spasms., Disp: 60 capsule, Rfl: 1   ezetimibe (ZETIA) 10 MG tablet, Take 1 tablet (10 mg total) by mouth daily. Please keep upcoming appt for future refills. Thank you., Disp: 90 tablet, Rfl: 3   fluconazole (DIFLUCAN) 100 MG tablet, Take 100 mg by mouth as needed. , Disp: , Rfl:    fluticasone (FLONASE) 50 MCG/ACT nasal spray, SPRAY 2 SPRAYS INTO EACH NOSTRIL EVERY DAY, Disp: 48 mL, Rfl: 1   fluticasone furoate-vilanterol (BREO ELLIPTA) 100-25 MCG/INH AEPB, TAKE 1 PUFF BY MOUTH EVERY DAY, Disp: 180 each, Rfl: 1   metformin (FORTAMET) 500 MG (OSM) 24 hr tablet, Take 500 mg by mouth daily. With largest meal of the day., Disp: , Rfl:    metoprolol succinate (TOPROL-XL) 50 MG 24 hr tablet, Take 25 mg by mouth daily. Take one half tablet by mouth daily with or  immediately following a meal. , Disp: , Rfl:    montelukast (SINGULAIR) 10 MG tablet, TAKE 1 TABLET BY MOUTH EVERYDAY AT BEDTIME, Disp: 90 tablet, Rfl: 2   Multiple Vitamin (MULTIVITAMIN) tablet, Take 1 tablet by mouth daily., Disp: , Rfl:    Naproxen Sodium (ALEVE) 220 MG CAPS, Take 1-2 capsules by mouth as needed., Disp: , Rfl:    omeprazole (PRILOSEC) 20 MG capsule, Take 1 capsule (20 mg total) by mouth daily. (Patient taking differently: Take 10 mg by mouth daily. Patient takes 10 mg), Disp: 30 capsule, Rfl: 5   pravastatin (PRAVACHOL) 40 MG tablet, Take 40 mg by mouth daily., Disp: , Rfl:    predniSONE (DELTASONE) 10 MG tablet, TAKE 1 TABLET (10 MG TOTAL) BY MOUTH DAILY WITH BREAKFAST. (Patient taking differently: Take 10 mg by mouth daily with breakfast. Takes 1/2 tablet daily), Disp: 90 tablet, Rfl: 1   Probiotic Product (PROBIOTIC ADVANCED PO), Take 2 capsules by mouth daily. , Disp: , Rfl:    Turmeric 500 MG CAPS, Take 1 capsule by mouth daily., Disp: , Rfl:    nitroGLYCERIN (NITROSTAT) 0.4 MG SL tablet, Place 1 tablet (0.4 mg total) under the tongue every 5 (five) minutes as needed for chest pain., Disp: 25 tablet, Rfl: 12      Objective:   Vitals:   08/22/19 1131  BP: 134/66  Pulse: 81  SpO2: 95%  Weight: 146 lb (66.2 kg)  Height: '4\' 11"'  (1.499 m)    Estimated body mass index is 29.49 kg/m as calculated from the following:   Height as of this encounter: '4\' 11"'  (1.499 m).   Weight as of this encounter: 146 lb (66.2 kg).  '@WEIGHTCHANGE' @  [employer]   08/22/19 1131  Weight: 146 lb (66.2 kg)     Physical Exam  General Appearance:    Alert, cooperative, no  distress, appears stated age - yes , Deconditioned looking - no , OBESE  - no, Sitting on Wheelchair -  no  Head:    Normocephalic, without obvious abnormality, atraumatic  Eyes:    PERRL, conjunctiva/corneas clear,  Ears:    Normal TM's and external ear canals, both ears  Nose:   Nares normal, septum  midline, mucosa normal, no drainage    or sinus tenderness. OXYGEN ON  - no . Patient is @ ra   Throat: MASK  Neck:   Supple, symmetrical, trachea midline, no adenopathy;    thyroid:  no enlargement/tenderness/nodules; no carotid   bruit or JVD  Back:     Symmetric, no curvature, ROM normal, no CVA tenderness  Lungs:     Distress - no , Wheeze no, Barrell Chest - no, Purse lip breathing - no, Crackles - scattered yes   Chest Wall:    No tenderness or deformity.    Heart:    Regular rate and rhythm, S1 and S2 normal, no rub   or gallop, Murmur - no  Breast Exam:    NOT DONE  Abdomen:     Soft, non-tender, bowel sounds active all four quadrants,    no masses, no organomegaly. Visceral obesity - no  Genitalia:   NOT DONE  Rectal:   NOT DONE  Extremities:   Extremities - normal, Has Cane - no, Clubbing - no, Edema - no  Pulses:   2+ and symmetric all extremities  Skin:   Stigmata of Connective Tissue Disease - no  Lymph nodes:   Cervical, supraclavicular, and axillary nodes normal  Psychiatric:  Neurologic:   Pleasant - yes, Anxious - no, Flat affect - no  CAm-ICU - neg, Alert and Oriented x 3 - yes, Moves all 4s - yes, Speech - normal, Cognition - intact           Assessment:       ICD-10-CM   1. Preop respiratory exam  Z01.811   2. ILD (interstitial lung disease) ([place])  J84.9   3. 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 oil pain and ? mold in house  J67.9     1) RISK FOR PROLONGED MECHANICAL VENTILAION - > 48h  1A) Arozullah - Prolonged mech ventilation risk Arozullah Postperative Pulmonary Risk Score - for mech ventilation dependence >48h [employer], Ann Surg 2000, major non-cardiac surgery) Comment Score  Type of surgery - abd ao aneurysm (27), thoracic (21), neurosurgery / upper abdominal / vascular (21), neck (11) hip 0  Emergency Surgery - (11) no 0  ALbumin < 3 or poor nutritional state - (9) 3.01 Dec 2018 0  BUN > 30 -  (8) 18 0  Partial or  completely dependent functional status - (7) I-adl 0  COPD -  (6) ild 6  Age - 60 to 69 (4), > 70  (6) 70 6  TOTAL  12  Risk Stratifcation scores  - < 10 (0.5%), 11-19 (1.8%), 20-27 (4.2%), 28-40 (10.1%), >40 (26.6%)  Low-moderate     2) RISK FOR POST OP PNEUMONIA Score source Risk  Lyndel Safe - Post Op Pnemounia risk  [email or web address] 1.8%    3) RISK FOR ANY POST-OP PULMONARY COMPLICATION Score source Risk  CANET/ARISCAT Score - risk for ANY/ALl pulmonary complications - > risk of in-hospital post-op pulmonary complications (composite including respiratory failure, respiratory infection, pleural effusion, atelectasis, pneumothorax, bronchospasm, aspiration pneumonitis) [email or web address] - based on age, anemia, pulse ox, resp infection prior 30d, incision site, duration  of surgery, and emergency v elective surgery LOW - 11 points - < 2%       Plan:     Patient Instructions  Preop respiratory exam  - low - moderate risk for pulmonary complications following hip surgery  Plan  - recommend surgery at hospital as opposed to ambulatory center - anesthesia could consider stress dose steroid just before surgery - early mobilization, dvt prophylaxis per protocol and pusle ox > 88% and incentive spirometry are crucial in recovery  ILD (interstitial lung disease) ([place]) 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 oil pain and ? mold in house  - stable  Plan  - cotinue pred 9m per day - hold off on anti-fibrotic given recent stability and per your concerns of GI side effects Followup  6 months do spirometey and dlco  30 min visit in 6 months      SIGNATURE    Dr. MBrand Males M.D., F.C.C.P,  Pulmonary and Critical Care Medicine Staff Physician, CTusculumDirector - Interstitial Lung Disease  Program  [facility] [place] [id number] Pager: [phone] If no answer or between  15:00h - 7:00h: call [phone] Telephone: [phone]  12:44 PM 08/22/2019

## 2019-08-22 NOTE — Patient Instructions (Addendum)
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 oil pain and ? mold in house  - stable  Plan  - cotinue pred 5mg  per day - hold off on anti-fibrotic given recent stability and per your concerns of GI side effects Followup  6 months do spirometey and dlco  30 min visit in 6 months

## 2019-08-23 ENCOUNTER — Telehealth: Payer: Self-pay

## 2019-08-23 NOTE — Telephone Encounter (Signed)
   Primary Cardiologist: Fransico Him, MD  Chart reviewed as part of pre-operative protocol coverage. Patient was contacted 08/23/2019 in reference to pre-operative risk assessment for pending surgery as outlined below.  Rhonda [name] was last seen on 12/2018 by Dr. Georgette Dover.  Since that day, Rhonda [name] has done well.  She had a normal nuclear stress test 01/17/19 after elevated calcium score on CT. She denies anginal pain. She can complete 4.0 METS.   Therefore, based on ACC/AHA guidelines, the patient would be at acceptable risk for the planned procedure without further cardiovascular testing.   I will route this recommendation to the requesting party via Epic fax function and remove from pre-op pool.  Please call with questions.  [place], PA 08/23/2019, 6:04 PM

## 2019-08-23 NOTE — Telephone Encounter (Incomplete)
   [facility] Pre-operative Risk Assessment    Request for surgical clearance:  What type of surgery is being performed? LEFT TOTAL HIP ARTHROPLASTY   When is this surgery scheduled? TBD   What type of clearance is required (medical clearance vs. Pharmacy clearance to hold med vs. Both)? MEDICAL  Are there any medications that need to be held prior to surgery and how long? NONE   Practice name and name of physician performing surgery? [facility], DR. Bernard Brick   What is your office phone number [phone]; ATTN: SHERRY WILLS    7.   What is your office fax number [phone]  8.   Anesthesia type (None, local, MAC, general) ? SPINAL   Rhonda [name] 08/23/2019, 4:25 PM  _________________________________________________________________   (provider comments below)

## 2019-08-24 NOTE — Progress Notes (Deleted)
Cardiology Office Note:    Date:  08/24/2019   ID:  Rhonda [name], DOB [birth year], MRN [id number]  PCP:  Hayden Rasmussen, MD  Cardiologist:  Fransico Him, MD *** Electrophysiologist:  None   Referring MD: Hayden Rasmussen, MD   No chief complaint on file. ***  History of Present Illness:    Rhonda [name] is a 68 y.o. female with:   Interstitial Lung Disease  Followed by pulmonology and [facility]   Gerd   Hyperlipidemia   Coronary artery Ca  CT 10/18: Ca Score 343   PVCs   Limited ability to do Coronary CTA in the past   MVP with Mitral Regurgitation   Echocardiogram 10/18: trivial MR   Pulmonary Hypertension   Rhonda [name] was last seen by Dr. Radford Pax in 12/2018.  The DICTATELATER SmartLink is not supported in this context. ***  Prior CV studies:   The following studies were reviewed today:  Myoview 01/17/2019 Normal stress nuclear study with no ischemia or infarction.  Gated ejection fraction 55% with normal wall motion.  Myoview 08/31/17  Nuclear stress EF: 76%.  There was accentuation of the resting ST/T wave abnormality during infusion.  The study is normal.  This is a low risk study.  The left ventricular ejection fraction is hyperdynamic (>65%).  Echocardiogram 08/31/17 Mild LVH, EF 55-60, no RWMA, G1 DD, trivial MR (post Mitral valve leaflet not well visualized), trivial TR, PASP 21  CT Cardiac Scoring 08/20/17 IMPRESSION: Coronary calcium score of 343 . This was 43 th percentile for age and sex matched control.   Echocardiogram 03/04/15 - Normal LV function; grade 1 diastolic dysfunction; mild prolapse  of posterior MV leaflet; mild MR and TR; mildly elevated  pulmonary pressure.     Past Medical History:  Diagnosis Date  . Allergy   . Arthritis    history spinal stenosis. osteoarthritis right hip  . Asthma   . Cataract   . Coronary artery calcification seen on CAT scan 08/19/2017   >300 on CT scan 08/2017   . Depression   . DOE (dyspnea on exertion)    a. 04/2010 Lexi MV EF 71%, no ischemia/infarct;    . GERD (gastroesophageal reflux disease)   . H/O steroid therapy    Steroid use orally over 4 yrs- for Lung Fibrosis  . Heart palpitations 02/28/2015  . Helicobacter pylori ab+   . Hiatal hernia   . High cholesterol   . History of chronic bronchitis    as child  . Hyperlipidemia, mixed 08/19/2017  . Hyperplastic colon polyp 2007  . Inguinal hernia    right  . Insulin resistance    past  . Interstitial lung disease ([place])   . MVP (mitral valve prolapse)    Posterior mitral valve leaflet with mild MR  . Pneumonitis, hypersensitivity ([place])    a. 09/2012 s/p Bx - ? 2/2 bird, mold, oil paint exposure ->on steroids, followed by pulm.  . Pulmonary fibrosis ([facility])    Dr. Chase Caller follows- stable at present  . PVC (premature ventricular contraction) 08/19/2017  . Rapid heart rate    Dr. Radford Pax follows- last visit Epic note 9'16   Surgical Hx: The patient  has a past surgical history that includes Tubal ligation; Foot fracture surgery (2006 or 2007); Video bronchoscopy (11/19/2011); HYMENECTOMY; Cesarean section; Video assisted thoracoscopy (09/28/2012); Lung biopsy (09/28/2012); Cataract extraction (Left); Total hip arthroplasty (Right, 09/10/2015); Breast biopsy (Right, 2009); Breast biopsy (Left, 2003); Squamous cell carcinoma excision (  Left); Esophageal manometry (N/A, 02/21/2018); and 24 hour ph study (N/A, 02/21/2018).   Current Medications: No outpatient medications have been marked as taking for the 08/25/19 encounter (Appointment) with Richardson Dopp T, PA-C.     Allergies:   Atorvastatin, Betadine [povidone iodine], Codeine, Garlic, Onion, Rosuvastatin, Shellfish allergy, and Sulfonamide derivatives   Social History   Tobacco Use  . Smoking status: Never Smoker  . Smokeless tobacco: Never Used  . Tobacco comment: pt states she experimented in college  Substance Use Topics  . Alcohol use:  Yes    Alcohol/week: 2.0 standard drinks    Types: 1 Glasses of wine, 1 Standard drinks or equivalent per week    Comment: once a wk. 1-2 glasses  . Drug use: No     Family Hx: The patient's family history includes Allergic rhinitis in her daughter; Asthma in her maternal grandmother and mother; Bone cancer in her paternal grandfather; Breast cancer in her cousin and paternal aunt; Dementia (age of onset: 64) in her mother; Diabetes in her father; Emphysema in her maternal grandmother; Heart disease in her sister; Hypertension in her sister; Kidney disease in her sister; Lung disease in her maternal grandfather; Lymphoma in her father; Osteoarthritis in her mother; Ovarian cancer in her maternal aunt. There is no history of Colon cancer, Angioedema, Eczema, Immunodeficiency, or Urticaria.  ROS:   Please see the history of present illness.    ROS All other systems reviewed and are negative.   EKGs/Labs/Other Test Reviewed:    EKG:  EKG is *** ordered today.  The ekg ordered today demonstrates ***  Recent Labs: 12/08/2018: ALT 14; BUN 18; Creatinine, Ser 0.88; Potassium 3.9; Sodium 142   Recent Lipid Panel Lab Results  Component Value Date/Time   CHOL 177 12/08/2018 09:17 AM   TRIG 131 12/08/2018 09:17 AM   HDL 68 12/08/2018 09:17 AM   CHOLHDL 2.6 12/08/2018 09:17 AM   CHOLHDL 3.5 02/26/2015 03:40 PM   LDLCALC 83 12/08/2018 09:17 AM    Physical Exam:    VS:  There were no vitals taken for this visit.    Wt Readings from Last 3 Encounters:  08/22/19 146 lb (66.2 kg)  05/09/19 148 lb 3.2 oz (67.2 kg)  01/17/19 149 lb (67.6 kg)     ***Physical Exam  ASSESSMENT & PLAN:    ***  Dispo:  No follow-ups on file.   Medication Adjustments/Labs and Tests Ordered: Current medicines are reviewed at length with the patient today.  Concerns regarding medicines are outlined above.  Tests Ordered: No orders of the defined types were placed in this encounter.  Medication Changes:  No orders of the defined types were placed in this encounter.   Signed, Richardson Dopp, PA-C  08/24/2019 9:51 PM    [facility] [address], [place], [place]  [address] Phone: [phone]; Fax: [phone]

## 2019-08-25 ENCOUNTER — Ambulatory Visit: Payer: Medicare Other | Admitting: Physician Assistant

## 2019-08-28 DIAGNOSIS — J811 Chronic pulmonary edema: Secondary | ICD-10-CM | POA: Diagnosis not present

## 2019-08-28 DIAGNOSIS — J8410 Pulmonary fibrosis, unspecified: Secondary | ICD-10-CM | POA: Diagnosis not present

## 2019-08-28 DIAGNOSIS — R7301 Impaired fasting glucose: Secondary | ICD-10-CM | POA: Diagnosis not present

## 2019-08-28 DIAGNOSIS — E785 Hyperlipidemia, unspecified: Secondary | ICD-10-CM | POA: Diagnosis not present

## 2019-08-28 DIAGNOSIS — L249 Irritant contact dermatitis, unspecified cause: Secondary | ICD-10-CM | POA: Diagnosis not present

## 2019-08-30 ENCOUNTER — Other Ambulatory Visit: Payer: Self-pay | Admitting: Internal Medicine

## 2019-08-30 NOTE — Telephone Encounter (Signed)
1. Yes please send my office note to Dr Alvan Dame  2.I am fillng his form 5:16 PM 08/30/2019 and will bein myoffice on the side desk to the right of a pink folder - ready for pick up

## 2019-08-30 NOTE — Telephone Encounter (Signed)
Call made to Dr. Honor Loh office ([facility]), LMTCB with sherry. We can fax office notes if this is what they need.

## 2019-08-30 NOTE — Telephone Encounter (Signed)
MR - please advise on surgical clearance letter.

## 2019-09-20 NOTE — H&P (Signed)
TOTAL HIP ADMISSION H&P  Patient is admitted for left total hip arthroplasty, anterior approach.  Subjective:  Chief Complaint:   Left hip primary OA / pain  HPI: Rhonda [name], 68 y.o. female, has a history of pain and functional disability in the left hip(s) due to arthritis and patient has failed non-surgical conservative treatments for greater than 12 weeks to include NSAID's and/or analgesics and activity modification.  Onset of symptoms was gradual starting 3 years ago with gradually worsening course since that time.The patient noted prior procedures of the hip to include arthroplasty on the right hip(s).  Patient currently rates pain in the left hip at 7 out of 10 with activity. Patient has night pain, worsening of pain with activity and weight bearing, trendelenberg gait, pain that interfers with activities of daily living and pain with passive range of motion. Patient has evidence of periarticular osteophytes and joint space narrowing by imaging studies. This condition presents safety issues increasing the risk of falls.  There is no current active infection.  Risks, benefits and expectations were discussed with the patient.  Risks including but not limited to the risk of anesthesia, blood clots, nerve damage, blood vessel damage, failure of the prosthesis, infection and up to and including death.  Patient understand the risks, benefits and expectations and wishes to proceed with surgery.   PCP: Hayden Rasmussen, MD  D/C Plans:       Home   Post-op Meds:       No Rx given  Tranexamic Acid:      To be given - IV   Decadron:      Is to be given  FYI:      ASA  Dilaudid PO and IV (codeine allergy)  DME:   Pt equipment arranged  PT:    [facility]  Pharmacy: [facility] [place]    Patient Active Problem List   Diagnosis Date Noted  . Pulmonary fibrosis ([place]) 04/25/2018  . Yeast dermatitis 04/25/2018  . Coronary artery calcification seen on CAT scan 08/19/2017  . Mixed  hyperlipidemia 08/19/2017  . PVC (premature ventricular contraction) 08/19/2017  . Hypoxemia 03/18/2017  . Erroneous encounter - disregard 02/17/2017  . Irritable larynx syndrome 01/13/2016  . Allergic rhinitis due to pollen 01/06/2016  . Mild persistent asthma 01/06/2016  . Allergy with anaphylaxis due to food 01/06/2016  . Current use of beta blocker 01/06/2016  . Gastroesophageal reflux disease 01/06/2016  . Tachycardia 01/06/2016  . Acute bronchitis 10/11/2015  . Obese 09/11/2015  . S/P right THA, AA 09/10/2015  . Pre-diabetes 08/22/2015  . Preop pulmonary/respiratory exam 07/08/2015  . MVP (mitral valve prolapse)   . Pulmonary HTN ([place])   . Heart palpitations 02/28/2015  . Right-sided chest wall pain 12/19/2014  . Cough 12/19/2014  . Wheezing 10/20/2014  . ILD (interstitial lung disease) ([place]) 09/20/2014  . Upper respiratory infection 08/25/2014  . Irritable bowel syndrome (IBS) 02/08/2014  . Spontaneous pneumothorax- Right 08/08/2013  . High risk medication use 01/03/2013  . Osteopenia 10/28/2012  . Metabolic syndrome 0000000  . 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 oil pain and ? mold in house 10/29/2011  . Snoring 10/29/2011  . Dyslipidemia 04/02/2010  . ABNORMAL EKG 04/02/2010  . DEPRESSION 03/27/2010  . MITRAL REGURGITATION 03/27/2010  . DYSPNEA ON EXERTION 03/27/2010   Past Medical History:  Diagnosis Date  . Allergy   . Arthritis    history spinal stenosis. osteoarthritis right hip  . Asthma   .  Cataract   . Coronary artery calcification seen on CAT scan 08/19/2017   >300 on CT scan 08/2017  . Depression   . DOE (dyspnea on exertion)    a. 04/2010 Lexi MV EF 71%, no ischemia/infarct;    . GERD (gastroesophageal reflux disease)   . H/O steroid therapy    Steroid use orally over 4 yrs- for Lung Fibrosis  . Heart palpitations 02/28/2015  . Helicobacter pylori ab+   . Hiatal hernia   . High cholesterol   . History of chronic bronchitis     as child  . Hyperlipidemia, mixed 08/19/2017  . Hyperplastic colon polyp 2007  . Inguinal hernia    right  . Insulin resistance    past  . Interstitial lung disease ([place])   . MVP (mitral valve prolapse)    Posterior mitral valve leaflet with mild MR  . Pneumonitis, hypersensitivity ([place])    a. 09/2012 s/p Bx - ? 2/2 bird, mold, oil paint exposure ->on steroids, followed by pulm.  . Pulmonary fibrosis ([facility])    Dr. Chase Caller follows- stable at present  . PVC (premature ventricular contraction) 08/19/2017  . Rapid heart rate    Dr. Radford Pax follows- last visit Epic note 9'16    Past Surgical History:  Procedure Laterality Date  . [place] STUDY N/A 02/21/2018   Procedure: [place] STUDY;  Surgeon: Mauri Pole, MD;  Location: [facility];  Service: Endoscopy;  Laterality: N/A;  . BREAST BIOPSY Right 2009   BIOPSY  . BREAST BIOPSY Left 2003   Benign   . CATARACT EXTRACTION Left   . CESAREAN SECTION    . ESOPHAGEAL MANOMETRY N/A 02/21/2018   Procedure: ESOPHAGEAL MANOMETRY (EM);  Surgeon: Mauri Pole, MD;  Location: [facility];  Service: Endoscopy;  Laterality: N/A;  . FOOT FRACTURE SURGERY  2006 or 2007   right  . HYMENECTOMY    . LUNG BIOPSY  09/28/2012   Procedure: LUNG BIOPSY;  Surgeon: Melrose Nakayama, MD;  Location: [place];  Service: Thoracic;  Laterality: N/A;  lung biopsies tims three  . SQUAMOUS CELL CARCINOMA EXCISION Left    left arm  . TOTAL HIP ARTHROPLASTY Right 09/10/2015   Procedure: RIGHT TOTAL HIP ARTHROPLASTY ANTERIOR APPROACH;  Surgeon: Paralee Cancel, MD;  Location: [facility];  Service: Orthopedics;  Laterality: Right;  . TUBAL LIGATION    . VIDEO ASSISTED THORACOSCOPY  09/28/2012   Procedure: VIDEO ASSISTED THORACOSCOPY;  Surgeon: Melrose Nakayama, MD;  Location: [place];  Service: Thoracic;  Laterality: Right;  Marland Kitchen VIDEO BRONCHOSCOPY  11/19/2011   Procedure: VIDEO BRONCHOSCOPY WITH FLUORO;  Surgeon: Brand Males, MD;  Location: [facility]  ENDOSCOPY;  Service: Endoscopy;;    No current facility-administered medications for this encounter.    Current Outpatient Medications  Medication Sig Dispense Refill Last Dose  . albuterol (PROAIR HFA) 108 (90 Base) MCG/ACT inhaler Inhale 2 puffs into the lungs every 4 (four) hours as needed for wheezing. Or coughing spells.  You may use 2 Puffs 5-10 minutes before exercise. 1 Inhaler 3 Taking  . Alpha-D-Galactosidase (BEANO PO) Take 1 tablet by mouth as needed (if eating onion or garlic).    Taking  . Ascorbic Acid (VITAMIN C) 1000 MG tablet Take 1,000 mg by mouth daily.   Taking  . BREO ELLIPTA 100-25 MCG/INH AEPB INHALE 1 PUFF BY MOUTH EVERY DAY 60 each 5   . Calcium-Vitamin D-Vitamin K (VIACTIV) [id number] MG-UNT-MCG CHEW Chew 2 tablets by mouth daily.  Taking  . chlorpheniramine (CHLOR-TRIMETON) 4 MG tablet Take 4 mg by mouth daily as needed for allergies.    Taking  . dicyclomine (BENTYL) 10 MG capsule Take 1 capsule (10 mg total) by mouth 2 (two) times daily as needed for spasms. 60 capsule 1 Taking  . ezetimibe (ZETIA) 10 MG tablet Take 1 tablet (10 mg total) by mouth daily. Please keep upcoming appt for future refills. Thank you. 90 tablet 3 Taking  . fluconazole (DIFLUCAN) 100 MG tablet Take 100 mg by mouth as needed.    Taking  . fluticasone (FLONASE) 50 MCG/ACT nasal spray SPRAY 2 SPRAYS INTO EACH NOSTRIL EVERY DAY 48 mL 1 Taking  . metformin (FORTAMET) 500 MG (OSM) 24 hr tablet Take 500 mg by mouth daily. With largest meal of the day.   Taking  . metoprolol succinate (TOPROL-XL) 50 MG 24 hr tablet Take 25 mg by mouth daily. Take one half tablet by mouth daily with or immediately following a meal.    Taking  . montelukast (SINGULAIR) 10 MG tablet TAKE 1 TABLET BY MOUTH EVERYDAY AT BEDTIME 90 tablet 2 Taking  . Multiple Vitamin (MULTIVITAMIN) tablet Take 1 tablet by mouth daily.   Taking  . Naproxen Sodium (ALEVE) 220 MG CAPS Take 1-2 capsules by mouth as needed.   Taking  .  nitroGLYCERIN (NITROSTAT) 0.4 MG SL tablet Place 1 tablet (0.4 mg total) under the tongue every 5 (five) minutes as needed for chest pain. 25 tablet 12 Taking  . omeprazole (PRILOSEC) 20 MG capsule Take 1 capsule (20 mg total) by mouth daily. (Patient taking differently: Take 10 mg by mouth daily. Patient takes 10 mg) 30 capsule 5 Taking  . pravastatin (PRAVACHOL) 40 MG tablet Take 40 mg by mouth daily.   Taking  . predniSONE (DELTASONE) 10 MG tablet TAKE 1 TABLET (10 MG TOTAL) BY MOUTH DAILY WITH BREAKFAST. (Patient taking differently: Take 10 mg by mouth daily with breakfast. Takes 1/2 tablet daily) 90 tablet 1 Taking  . Probiotic Product (PROBIOTIC ADVANCED PO) Take 2 capsules by mouth daily.    Taking  . Turmeric 500 MG CAPS Take 1 capsule by mouth daily.   Taking   Allergies  Allergen Reactions  . Atorvastatin Other (See Comments)    Leg pain  . Betadine [Povidone Iodine] Other (See Comments)    blisters  . Codeine Nausea And Vomiting  . Garlic     Severe diarrhea per patient  . Onion     Severe diarrhea per patient  . Rosuvastatin Other (See Comments)    Leg pain  . Shellfish Allergy Other (See Comments)    vomiting  . Sulfonamide Derivatives Other (See Comments)    headaches    Social History   Tobacco Use  . Smoking status: Never Smoker  . Smokeless tobacco: Never Used  . Tobacco comment: pt states she experimented in college  Substance Use Topics  . Alcohol use: Yes    Alcohol/week: 2.0 standard drinks    Types: 1 Glasses of wine, 1 Standard drinks or equivalent per week    Comment: once a wk. 1-2 glasses    Family History  Problem Relation Age of Onset  . Emphysema Maternal Grandmother   . Asthma Maternal Grandmother   . Asthma Mother   . Osteoarthritis Mother   . Dementia Mother 64  . Lymphoma Father   . Diabetes Father   . Hypertension Sister   . Heart disease Sister   . Kidney disease Sister   .  Lung disease Maternal Grandfather   . Bone cancer Paternal  Grandfather   . Allergic rhinitis Daughter   . Breast cancer Paternal Aunt   . Ovarian cancer Maternal Aunt   . Breast cancer Cousin   . Colon cancer Neg Hx   . Angioedema Neg Hx   . Eczema Neg Hx   . Immunodeficiency Neg Hx   . Urticaria Neg Hx      Review of Systems  Constitutional: Negative.   HENT: Negative.   Eyes: Negative.   Respiratory: Positive for shortness of breath.   Cardiovascular: Negative.   Gastrointestinal: Negative.   Genitourinary: Negative.   Musculoskeletal: Positive for joint pain.  Skin: Negative.   Neurological: Negative.   Endo/Heme/Allergies: Negative.   Psychiatric/Behavioral: Negative.     Objective:  Physical Exam  Constitutional: She is oriented to person, place, and time. She appears well-developed.  HENT:  Head: Normocephalic.  Eyes: Pupils are equal, round, and reactive to light.  Neck: Neck supple. No JVD present. No tracheal deviation present. No thyromegaly present.  Cardiovascular: Normal rate, regular rhythm and intact distal pulses.  Respiratory: Effort normal and breath sounds normal. No respiratory distress. She has no wheezes.  GI: Soft. There is no abdominal tenderness. There is no guarding.  Musculoskeletal:     Left hip: She exhibits decreased range of motion, decreased strength, tenderness and bony tenderness. She exhibits no swelling, no deformity and no laceration.  Lymphadenopathy:    She has no cervical adenopathy.  Neurological: She is alert and oriented to person, place, and time.  Skin: Skin is warm and dry.  Psychiatric: She has a normal mood and affect.      Labs:  Estimated body mass index is 29.49 kg/m as calculated from the following:   Height as of 08/22/19: 4\' 11"  (1.499 m).   Weight as of 08/22/19: 66.2 kg.   Imaging Review Plain radiographs demonstrate severe degenerative joint disease of the left hip(s). The bone quality appears to be good for age and reported activity  level.      Assessment/Plan:  End stage arthritis, left hip  The patient history, physical examination, clinical judgement of the provider and imaging studies are consistent with end stage degenerative joint disease of the left hip(s) and total hip arthroplasty is deemed medically necessary. The treatment options including medical management, injection therapy, arthroscopy and arthroplasty were discussed at length. The risks and benefits of total hip arthroplasty were presented and reviewed. The risks due to aseptic loosening, infection, stiffness, dislocation/subluxation,  thromboembolic complications and other imponderables were discussed.  The patient acknowledged the explanation, agreed to proceed with the plan and consent was signed. Patient is being admitted for inpatient treatment for surgery, pain control, PT, OT, prophylactic antibiotics, VTE prophylaxis, progressive ambulation and ADL's and discharge planning.The patient is planning to be discharged home.  Anticipated LOS equal to or greater than 2 midnights due to - Age 22 and older with one or more of the following:  - Obesity  - Expected need for hospital services (PT, OT, Nursing) required for safe  discharge  - Anticipated need for postoperative skilled nursing care or inpatient rehab  - Active co-morbidities: Respiratory Failure/COPD and pulmopnary fibrosis    [place] Pugh. Rodina Pinales   PA-C  09/20/2019, 3:38 PM

## 2019-09-21 NOTE — Telephone Encounter (Signed)
Dr. Chase Caller,   Patient sent email this afternoon,  "Attention Raquel Sarna. Hi. I texted Dr R about renewing my handicapped sticker. Can you please facilitate this?  It's expiring Feb 2021."  Please advise

## 2019-09-22 NOTE — Telephone Encounter (Signed)
Yes, please do one on basis of pulmonary fibrosis. Cannot walk distance and lung function. An APP can sign it for me or have to wait a week till I come baack.

## 2019-09-25 NOTE — Telephone Encounter (Signed)
Message routed to Dr. Chase Caller and Raquel Sarna to follow up 09/27/19.

## 2019-09-25 NOTE — Telephone Encounter (Signed)
Handicap placard placed in Dr. Golden Pop box on B pod.  He returns to office 09/27/19

## 2019-09-25 NOTE — Telephone Encounter (Signed)
This can wait till 09/27/2019 when Dr. Chase Caller is back in office.  Wyn Quaker, FNP

## 2019-09-26 ENCOUNTER — Encounter (HOSPITAL_COMMUNITY): Payer: Self-pay

## 2019-09-26 NOTE — Patient Instructions (Addendum)
DUE TO COVID-19 ONLY ONE VISITOR IS ALLOWED TO COME WITH YOU AND STAY IN THE WAITING ROOM ONLY DURING PRE OP AND PROCEDURE. THE ONE VISITOR MAY VISIT WITH YOU IN YOUR PRIVATE ROOM DURING VISITING HOURS ONLY!!   COVID SWAB TESTING MUST BE COMPLETED ON: Friday, Nov. 6, 2020 at 9:10AM [address], [place] -[facility] enter pre surgical testing line (Must self quarantine after testing. Follow instructions on handout.)             Your procedure is scheduled on: Tuesday, Nov. 10, 2020   Report to [facility] Main  Entrance    Report to admitting at 9:00 AM   Call this number if you have problems the morning of surgery [phone]   Do not eat food :After Midnight.   May have liquids until 8:30 AM day of surgery   CLEAR LIQUID DIET  Foods Allowed                                                                     Foods Excluded  Water, Black Coffee and tea, regular and decaf                             liquids that you cannot  Plain Jell-O in any flavor  (No red)                                           see through such as: Fruit ices (not with fruit pulp)                                     milk, soups, orange juice  Iced Popsicles (No red)                                    All solid food Carbonated beverages, regular and diet                                    Apple juices Sports drinks like Gatorade (No red) Lightly seasoned clear broth or consume(fat free) Sugar, honey syrup  Sample Menu Breakfast                                Lunch                                     Supper Cranberry juice                    Beef broth                            Chicken broth Jell-O  Grape juice                           Apple juice Coffee or tea                        Jell-O                                      Popsicle                                                Coffee or tea                        Coffee or  tea   Complete one G2 drink the morning of surgery at 8:30 AM the day of surgery.   Brush your teeth the morning of surgery.   Do NOT smoke after Midnight   Take these medicines the morning of surgery with A SIP OF WATER:  Metoprolol, Omeprazole, Prednisone    Use Inhaler and Flonase per normal routine day of surgery   Bring Asthma Inhaler day of surgery  DO NOT TAKE ANY DIABETIC MEDICATIONS DAY OF YOUR SURGERY                               You may not have any metal on your body including hair pins, jewelry, and body piercings             Do not wear make-up, lotions, powders, perfumes/cologne, or deodorant             Do not wear nail polish.  Do not shave  48 hours prior to surgery.                 Do not bring valuables to the hospital. [place].   Contacts, dentures or bridgework may not be worn into surgery.   Bring small overnight bag day of surgery.    Special Instructions: Bring a copy of your healthcare power of attorney and living will documents         the day of surgery if you haven't scanned them in before.              Please read over the following fact sheets you were given:  How to Manage Your Diabetes Before and After Surgery  Why is it important to control my blood sugar before and after surgery? . Improving blood sugar levels before and after surgery helps healing and can limit problems. . A way of improving blood sugar control is eating a healthy diet by: o  Eating less sugar and carbohydrates o  Increasing activity/exercise o  Talking with your doctor about reaching your blood sugar goals . High blood sugars (greater than 180 mg/dL) can raise your risk of infections and slow your recovery, so you will need to focus on controlling your diabetes during the weeks before surgery. . Make sure that the doctor who takes care of your diabetes knows about your planned surgery  including the date and  location.  How do I manage my blood sugar before surgery? . Check your blood sugar at least 4 times a day, starting 2 days before surgery, to make sure that the level is not too high or low. o Check your blood sugar the morning of your surgery when you wake up and every 2 hours until you get to the Short Stay unit. . If your blood sugar is less than 70 mg/dL, you will need to treat for low blood sugar: o Do not take insulin. o Treat a low blood sugar (less than 70 mg/dL) with  cup of clear juice (cranberry or apple), 4 glucose tablets, OR glucose gel. o Recheck blood sugar in 15 minutes after treatment (to make sure it is greater than 70 mg/dL). If your blood sugar is not greater than 70 mg/dL on recheck, call [phone] for further instructions. . Report your blood sugar to the short stay nurse when you get to Short Stay.  . If you are admitted to the hospital after surgery: o Your blood sugar will be checked by the staff and you will probably be given insulin after surgery (instead of oral diabetes medicines) to make sure you have good blood sugar levels. o The goal for blood sugar control after surgery is 80-180 mg/dL.   WHAT DO I DO ABOUT MY DIABETES MEDICATION?    Monday, Nov. 9, 2020 take usual dose of Metformin  . Do not take oral diabetes medicines (pills) the morning of surgery.  Reviewed and Endorsed by [facility] Patient Education Committee, August 2015  [facility] - Preparing for Surgery Before surgery, you can play an important role.  Because skin is not sterile, your skin needs to be as free of germs as possible.  You can reduce the number of germs on your skin by washing with CHG (chlorahexidine gluconate) soap before surgery.  CHG is an antiseptic cleaner which kills germs and bonds with the skin to continue killing germs even after washing. Please DO NOT use if you have an allergy to CHG or antibacterial soaps.  If your skin becomes reddened/irritated stop using the  CHG and inform your nurse when you arrive at Short Stay. Do not shave (including legs and underarms) for at least 48 hours prior to the first CHG shower.  You may shave your face/neck.  Please follow these instructions carefully:  1.  Shower with CHG Soap the night before surgery and the  morning of surgery.  2.  If you choose to wash your hair, wash your hair first as usual with your normal  shampoo.  3.  After you shampoo, rinse your hair and body thoroughly to remove the shampoo.                             4.  Use CHG as you would any other liquid soap.  You can apply chg directly to the skin and wash.  Gently with a scrungie or clean washcloth.  5.  Apply the CHG Soap to your body ONLY FROM THE NECK DOWN.   Do   not use on face/ open                           Wound or open sores. Avoid contact with eyes, ears mouth and   genitals (private parts).  Wash face,  Genitals (private parts) with your normal soap.             6.  Wash thoroughly, paying special attention to the area where your    surgery  will be performed.  7.  Thoroughly rinse your body with warm water from the neck down.  8.  DO NOT shower/wash with your normal soap after using and rinsing off the CHG Soap.                9.  Pat yourself dry with a clean towel.            10.  Wear clean pajamas.            11.  Place clean sheets on your bed the night of your first shower and do not  sleep with pets. Day of Surgery : Do not apply any lotions/deodorants the morning of surgery.  Please wear clean clothes to the hospital/surgery center.  FAILURE TO FOLLOW THESE INSTRUCTIONS MAY RESULT IN THE CANCELLATION OF YOUR SURGERY  PATIENT SIGNATURE_________________________________  NURSE SIGNATURE__________________________________  ________________________________________________________________________   Adam Phenix  An incentive spirometer is a tool that can help keep your lungs clear and active.  This tool measures how well you are filling your lungs with each breath. Taking long deep breaths may help reverse or decrease the chance of developing breathing (pulmonary) problems (especially infection) following:  A long period of time when you are unable to move or be active. BEFORE THE PROCEDURE   If the spirometer includes an indicator to show your best effort, your nurse or respiratory therapist will set it to a desired goal.  If possible, sit up straight or lean slightly forward. Try not to slouch.  Hold the incentive spirometer in an upright position. INSTRUCTIONS FOR USE  1. Sit on the edge of your bed if possible, or sit up as far as you can in bed or on a chair. 2. Hold the incentive spirometer in an upright position. 3. Breathe out normally. 4. Place the mouthpiece in your mouth and seal your lips tightly around it. 5. Breathe in slowly and as deeply as possible, raising the piston or the ball toward the top of the column. 6. Hold your breath for 3-5 seconds or for as long as possible. Allow the piston or ball to fall to the bottom of the column. 7. Remove the mouthpiece from your mouth and breathe out normally. 8. Rest for a few seconds and repeat Steps 1 through 7 at least 10 times every 1-2 hours when you are awake. Take your time and take a few normal breaths between deep breaths. 9. The spirometer may include an indicator to show your best effort. Use the indicator as a goal to work toward during each repetition. 10. After each set of 10 deep breaths, practice coughing to be sure your lungs are clear. If you have an incision (the cut made at the time of surgery), support your incision when coughing by placing a pillow or rolled up towels firmly against it. Once you are able to get out of bed, walk around indoors and cough well. You may stop using the incentive spirometer when instructed by your caregiver.  RISKS AND COMPLICATIONS  Take your time so you do not get dizzy or  light-headed.  If you are in pain, you may need to take or ask for pain medication before doing incentive spirometry. It is harder to take a deep breath if  you are having pain. AFTER USE  Rest and breathe slowly and easily.  It can be helpful to keep track of a log of your progress. Your caregiver can provide you with a simple table to help with this. If you are using the spirometer at home, follow these instructions: [place] IF:   You are having difficultly using the spirometer.  You have trouble using the spirometer as often as instructed.  Your pain medication is not giving enough relief while using the spirometer.  You develop fever of 100.5 F (38.1 C) or higher. SEEK IMMEDIATE MEDICAL CARE IF:   You cough up bloody sputum that had not been present before.  You develop fever of 102 F (38.9 C) or greater.  You develop worsening pain at or near the incision site. MAKE SURE YOU:   Understand these instructions.  Will watch your condition.  Will get help right away if you are not doing well or get worse. Document Released: 03/22/2007 Document Revised: 02/01/2012 Document Reviewed: 05/23/2007 Exit[employer] Patient Information 2014 ExitCare, [place].   ________________________________________________________________________  WHAT IS A BLOOD TRANSFUSION? Blood Transfusion Information  A transfusion is the replacement of blood or some of its parts. Blood is made up of multiple cells which provide different functions.  Red blood cells carry oxygen and are used for blood loss replacement.  White blood cells fight against infection.  Platelets control bleeding.  Plasma helps clot blood.  Other blood products are available for specialized needs, such as hemophilia or other clotting disorders. BEFORE THE TRANSFUSION  Who gives blood for transfusions?   Healthy volunteers who are fully evaluated to make sure their blood is safe. This is blood bank  blood. Transfusion therapy is the safest it has ever been in the practice of medicine. Before blood is taken from a donor, a complete history is taken to make sure that person has no history of diseases nor engages in risky social behavior (examples are intravenous drug use or sexual activity with multiple partners). The donor's travel history is screened to minimize risk of transmitting infections, such as malaria. The donated blood is tested for signs of infectious diseases, such as HIV and hepatitis. The blood is then tested to be sure it is compatible with you in order to minimize the chance of a transfusion reaction. If you or a relative donates blood, this is often done in anticipation of surgery and is not appropriate for emergency situations. It takes many days to process the donated blood. RISKS AND COMPLICATIONS Although transfusion therapy is very safe and saves many lives, the main dangers of transfusion include:   Getting an infectious disease.  Developing a transfusion reaction. This is an allergic reaction to something in the blood you were given. Every precaution is taken to prevent this. The decision to have a blood transfusion has been considered carefully by your caregiver before blood is given. Blood is not given unless the benefits outweigh the risks. AFTER THE TRANSFUSION  Right after receiving a blood transfusion, you will usually feel much better and more energetic. This is especially true if your red blood cells have gotten low (anemic). The transfusion raises the level of the red blood cells which carry oxygen, and this usually causes an energy increase.  The nurse administering the transfusion will monitor you carefully for complications. HOME CARE INSTRUCTIONS  No special instructions are needed after a transfusion. You may find your energy is better. Speak with your caregiver about any limitations on activity  for underlying diseases you may have. SEEK MEDICAL CARE IF:    Your condition is not improving after your transfusion.  You develop redness or irritation at the intravenous (IV) site. SEEK IMMEDIATE MEDICAL CARE IF:  Any of the following symptoms occur over the next 12 hours:  Shaking chills.  You have a temperature by mouth above 102 F (38.9 C), not controlled by medicine.  Chest, back, or muscle pain.  People around you feel you are not acting correctly or are confused.  Shortness of breath or difficulty breathing.  Dizziness and fainting.  You get a rash or develop hives.  You have a decrease in urine output.  Your urine turns a dark color or changes to pink, red, or brown. Any of the following symptoms occur over the next 10 days:  You have a temperature by mouth above 102 F (38.9 C), not controlled by medicine.  Shortness of breath.  Weakness after normal activity.  The white part of the eye turns yellow (jaundice).  You have a decrease in the amount of urine or are urinating less often.  Your urine turns a dark color or changes to pink, red, or brown. Document Released: 11/06/2000 Document Revised: 02/01/2012 Document Reviewed: 06/25/2008 [facility] Patient Information 2014 [place], [place].  _______________________________________________________________________

## 2019-09-28 ENCOUNTER — Other Ambulatory Visit: Payer: Self-pay

## 2019-09-28 ENCOUNTER — Encounter (HOSPITAL_COMMUNITY)
Admission: RE | Admit: 2019-09-28 | Discharge: 2019-09-28 | Disposition: A | Discharge status: Home / Self Care | Payer: Medicare Other | Source: Ambulatory Visit | Attending: Orthopedic Surgery | Admitting: Orthopedic Surgery

## 2019-09-28 ENCOUNTER — Encounter (HOSPITAL_COMMUNITY): Payer: Self-pay

## 2019-09-28 DIAGNOSIS — Z79899 Other long term (current) drug therapy: Secondary | ICD-10-CM | POA: Insufficient documentation

## 2019-09-28 DIAGNOSIS — K760 Fatty (change of) liver, not elsewhere classified: Secondary | ICD-10-CM | POA: Insufficient documentation

## 2019-09-28 DIAGNOSIS — Z01812 Encounter for preprocedural laboratory examination: Secondary | ICD-10-CM | POA: Diagnosis not present

## 2019-09-28 DIAGNOSIS — J45909 Unspecified asthma, uncomplicated: Secondary | ICD-10-CM | POA: Insufficient documentation

## 2019-09-28 DIAGNOSIS — K219 Gastro-esophageal reflux disease without esophagitis: Secondary | ICD-10-CM | POA: Insufficient documentation

## 2019-09-28 DIAGNOSIS — Z7984 Long term (current) use of oral hypoglycemic drugs: Secondary | ICD-10-CM | POA: Insufficient documentation

## 2019-09-28 DIAGNOSIS — M25552 Pain in left hip: Secondary | ICD-10-CM | POA: Insufficient documentation

## 2019-09-28 DIAGNOSIS — M1612 Unilateral primary osteoarthritis, left hip: Secondary | ICD-10-CM | POA: Insufficient documentation

## 2019-09-28 DIAGNOSIS — E785 Hyperlipidemia, unspecified: Secondary | ICD-10-CM | POA: Insufficient documentation

## 2019-09-28 DIAGNOSIS — M5134 Other intervertebral disc degeneration, thoracic region: Secondary | ICD-10-CM | POA: Diagnosis not present

## 2019-09-28 HISTORY — DX: Other specified postprocedural states: R11.2

## 2019-09-28 HISTORY — DX: Pneumonia, unspecified organism: J18.9

## 2019-09-28 HISTORY — DX: Unspecified hemorrhoids: K64.9

## 2019-09-28 HISTORY — DX: Tinnitus, right ear: H93.11

## 2019-09-28 HISTORY — DX: Irritable bowel syndrome, unspecified: K58.9

## 2019-09-28 HISTORY — DX: Other intervertebral disc degeneration, thoracic region: M51.34

## 2019-09-28 HISTORY — DX: Nausea with vomiting, unspecified: Z98.890

## 2019-09-28 HISTORY — DX: Fatty (change of) liver, not elsewhere classified: K76.0

## 2019-09-28 HISTORY — DX: Prediabetes: R73.03

## 2019-09-28 HISTORY — DX: Personal history of Methicillin resistant Staphylococcus aureus infection: Z86.14

## 2019-09-28 HISTORY — DX: Squamous cell carcinoma of skin, unspecified: C44.92

## 2019-09-28 HISTORY — DX: Personal history of other diseases of the nervous system and sense organs: Z86.69

## 2019-09-28 LAB — CBC
HCT: 47 % — ABNORMAL HIGH (ref 36.0–46.0)
Hemoglobin: 14.7 g/dL (ref 12.0–15.0)
MCH: 29.3 pg (ref 26.0–34.0)
MCHC: 31.3 g/dL (ref 30.0–36.0)
MCV: 93.6 fL (ref 80.0–100.0)
Platelets: 194 10*3/uL (ref 150–400)
RBC: 5.02 MIL/uL (ref 3.87–5.11)
RDW: 13.6 % (ref 11.5–15.5)
WBC: 15.9 10*3/uL — ABNORMAL HIGH (ref 4.0–10.5)
nRBC: 0 % (ref 0.0–0.2)

## 2019-09-28 LAB — HEMOGLOBIN A1C
Hgb A1c MFr Bld: 6.2 % — ABNORMAL HIGH (ref 4.8–5.6)
Mean Plasma Glucose: 131.24 mg/dL

## 2019-09-28 LAB — BASIC METABOLIC PANEL
Anion gap: 7 (ref 5–15)
BUN: 19 mg/dL (ref 8–23)
CO2: 26 mmol/L (ref 22–32)
Calcium: 9.3 mg/dL (ref 8.9–10.3)
Chloride: 104 mmol/L (ref 98–111)
Creatinine, Ser: 0.92 mg/dL (ref 0.44–1.00)
GFR calc Af Amer: >60 mL/min (ref >60)
GFR calc non Af Amer: >60 mL/min (ref >60)
Glucose, Bld: 112 mg/dL — ABNORMAL HIGH (ref 70–99)
Potassium: 4.5 mmol/L (ref 3.5–5.1)
Sodium: 137 mmol/L (ref 135–145)

## 2019-09-28 LAB — SURGICAL PCR SCREEN
MRSA, PCR: NEGATIVE
Staphylococcus aureus: NEGATIVE

## 2019-09-28 LAB — GLUCOSE, CAPILLARY: Glucose-Capillary: 126 mg/dL — ABNORMAL HIGH (ref 70–99)

## 2019-09-28 NOTE — Progress Notes (Signed)
PCP - Dr. Doristine Section, Rexene Edison NP last office visit 10/31/2018 in epic Cardiologist - Dr. Tressia Miners turner last office visit 01/12/2019 in epic clearance note 08/23/2019 in epic Pulmonologist: Dr. Ann Lions last office clearance 08/22/2019 in epic  Chest x-ray - N/A EKG - 01/12/2019 in epic Stress Test - 01/17/2019 in epic ECHO - 09/10/2017 in epic Cardiac Cath - N/A  Sleep Study - N/A CPAP - N/A  Fasting Blood Sugar -  80-90 Checks Blood Sugar once week  Blood Thinner Instructions: N/A Aspirin Instructions:N/A Last Dose:N/A  Anesthesia review: Pulmary fibrosis, ILD, DM  Patient denies shortness of breath, fever, cough and chest pain at PAT appointment   Patient verbalized understanding of instructions that were given to them at the PAT appointment. Patient was also instructed that they will need to review over the PAT instructions again at home before surgery.

## 2019-09-28 NOTE — Progress Notes (Signed)
CBC results WBC greater than 15 routed to Dr. Alvan Dame via epic. 09/28/2019

## 2019-09-28 NOTE — Progress Notes (Signed)
SPOKE W/  Shenice     SCREENING SYMPTOMS OF COVID 19:   COUGH--NO  RUNNY NOSE--- NO  SORE THROAT---NO  NASAL CONGESTION----NO  SNEEZING----NO  SHORTNESS OF BREATH---NO  DIFFICULTY BREATHING---NO  TEMP >100.0 -----NO  UNEXPLAINED BODY ACHES------NO  CHILLS -------- NO  HEADACHES ---------NO  LOSS OF SMELL/ TASTE --------NO    HAVE YOU OR ANY FAMILY MEMBER TRAVELLED PAST 14 DAYS OUT OF THE   COUNTY---NO STATE----NO COUNTRY----NO  HAVE YOU OR ANY FAMILY MEMBER BEEN EXPOSED TO ANYONE WITH COVID 19? NO    

## 2019-09-29 ENCOUNTER — Other Ambulatory Visit (HOSPITAL_COMMUNITY)
Admission: RE | Admit: 2019-09-29 | Discharge: 2019-09-29 | Disposition: A | Discharge status: Home / Self Care | Payer: Medicare Other | Source: Ambulatory Visit | Attending: Orthopedic Surgery | Admitting: Orthopedic Surgery

## 2019-09-29 DIAGNOSIS — Z20828 Contact with and (suspected) exposure to other viral communicable diseases: Secondary | ICD-10-CM | POA: Insufficient documentation

## 2019-09-29 DIAGNOSIS — Z01812 Encounter for preprocedural laboratory examination: Secondary | ICD-10-CM | POA: Diagnosis not present

## 2019-09-29 NOTE — Progress Notes (Signed)
Anesthesia Chart Review   Case: [id number] Date/Time: 10/03/19 1115   Procedure: TOTAL HIP ARTHROPLASTY ANTERIOR APPROACH (Left Hip) - 70 mins   Anesthesia type: Spinal   Pre-op diagnosis: Left hip osteoarthrist   Location: [place] [facility] / [facility]   Surgeon: Paralee Cancel, MD      DISCUSSION:68 y.o. never smoker with h/o PONV, GERD, asthma, ILD, MVP, HLD, pre-diabetes, left hip OA scheduled for above procedure 10/03/2019 with Dr. Paralee Cancel.   Clearance from cardiology received 08/23/2019.  Per Fabian Sharp, PA-C, "Aretha Parrot was last seen on 12/2018 by Dr. Georgette Dover.  Since that day, NABIL MOULIN has done well.  She had a normal nuclear stress test 01/17/19 after elevated calcium score on CT. She denies anginal pain. She can complete 4.0 METS. Therefore, based on ACC/AHA guidelines, the patient would be at acceptable risk for the planned procedure without further cardiovascular testing."  Last seen by pulmonologist, Dr. Brand Males, 08/22/2019.  Stable at this visit.  Per OV note, "Low-moderate risk for pulmonary complications following hip surgery.  Plan. Recommend surgery at hospital as opposed to ambulatory center.  Anesthesia could consider stress dose steroid just before surgery.  Early mobilization, dvt prophylaxis per protocol and pulse ox > 88% and incentive spirometry are crucial in recovery."  Anticipate pt can proceed with planned procedure barring acute status change.   VS: BP (!) 153/69 (BP Location: Left Arm)   Pulse 93   Temp 37 C (Oral)   Resp 18   Ht 4\' 11"  (1.499 m)   Wt 66.3 kg   SpO2 96%   BMI 29.51 kg/m   PROVIDERS: Hayden Rasmussen, MD is PCP   Fransico Him, MD is Cardiologist   Brand Males, MD is Pulmonologist  LABS: Labs reviewed: Acceptable for surgery. (all labs ordered are listed, but only abnormal results are displayed)  Labs Reviewed  GLUCOSE, CAPILLARY - Abnormal; Notable for the following components:      Result Value   Glucose-Capillary 126 (*)    All other components within normal limits  HEMOGLOBIN A1C - Abnormal; Notable for the following components:   Hgb A1c MFr Bld 6.2 (*)    All other components within normal limits  BASIC METABOLIC PANEL - Abnormal; Notable for the following components:   Glucose, Bld 112 (*)    All other components within normal limits  CBC - Abnormal; Notable for the following components:   WBC 15.9 (*)    HCT 47.0 (*)    All other components within normal limits  SURGICAL PCR SCREEN  TYPE AND SCREEN     IMAGES:   EKG: Rate 77 bpm Sinus rhythm with frequent premature ventricular complexes Possible left atrial enlargement  T wave abnormality, consider anterolateral ischemia   CV: Myocardial Perfusion Imaging 01/17/2019  Nuclear stress EF: 55%.  There was no ST segment deviation noted during stress.  The study is normal.  This is a low risk study.  The left ventricular ejection fraction is normal (55-65%).   Normal stress nuclear study with no ischemia or infarction.  Gated ejection fraction 55% with normal wall motion.  Echo 08/31/2017 Study Conclusions  - Left ventricle: The cavity size was normal. Wall thickness was   increased in a pattern of mild LVH. Systolic function was normal.   The estimated ejection fraction was in the range of 55% to 60%.   Wall motion was normal; there were no regional wall motion   abnormalities. Doppler parameters are consistent  with abnormal   left ventricular relaxation (grade 1 diastolic dysfunction). The   E/e&' ratio is between 8-15, suggesting indeterminate LV filling   pressure. - Mitral valve: Mildly thickened leaflets . There was trivial   regurgitation. - Left atrium: The atrium was normal in size. - Tricuspid valve: There was trivial regurgitation. - Pulmonary arteries: PA peak pressure: 21 mm Hg (S). - Inferior vena cava: The vessel was normal in size. The   respirophasic diameter changes were in the  normal range (>= 50%),   consistent with normal central venous pressure.  Impressions:  - Compared to a prior study in 2016, there are no significant   changes. There is trivial MR - the posterior mitral valve leaflet   was not well-visualized to determine if prolapse was present. Past Medical History:  Diagnosis Date  . Allergy   . Arthritis    history spinal stenosis. osteoarthritis right hip  . Asthma   . Cataract   . Coronary artery calcification seen on CAT scan 08/19/2017   >300 on CT scan 08/2017  . DDD (degenerative disc disease), thoracic   . Depression   . DOE (dyspnea on exertion)    a. 04/2010 Lexi MV EF 71%, no ischemia/infarct;    . Fatty liver   . GERD (gastroesophageal reflux disease)   . H/O steroid therapy    Steroid use orally over 4 yrs- for Lung Fibrosis  . Heart palpitations 02/28/2015  . Helicobacter pylori ab+   . Hemorrhoids   . Hiatal hernia   . High cholesterol   . History of chronic bronchitis    as child  . History of migraine   . History of MRSA infection   . Hyperlipidemia, mixed 08/19/2017  . Hyperplastic colon polyp 2007  . IBS (irritable bowel syndrome)   . Inguinal hernia    right  . Insulin resistance    past  . Interstitial lung disease ([place])   . MVP (mitral valve prolapse)    Posterior mitral valve leaflet with mild MR  . Pneumonia   . Pneumonitis, hypersensitivity ([place])    a. 09/2012 s/p Bx - ? 2/2 bird, mold, oil paint exposure ->on steroids, followed by pulm.  Marland Kitchen PONV (postoperative nausea and vomiting)   . Pre-diabetes    takes Metformin  . Pulmonary fibrosis ([facility])    Dr. Chase Caller follows- stable at present  . PVC (premature ventricular contraction) 08/19/2017  . Rapid heart rate    Dr. Radford Pax follows- last visit Epic note 9'16  . Squamous cell carcinoma of skin   . Tinnitus, right ear     Past Surgical History:  Procedure Laterality Date  . [place] STUDY N/A 02/21/2018   Procedure: [place] STUDY;  Surgeon:  Mauri Pole, MD;  Location: [facility];  Service: Endoscopy;  Laterality: N/A;  . BREAST BIOPSY Right 2009   BIOPSY, pt denies  . BREAST BIOPSY Left 2003   Benign   . CATARACT EXTRACTION Left   . CESAREAN SECTION    . COLONOSCOPY    . ESOPHAGEAL MANOMETRY N/A 02/21/2018   Procedure: ESOPHAGEAL MANOMETRY (EM);  Surgeon: Mauri Pole, MD;  Location: [facility];  Service: Endoscopy;  Laterality: N/A;  . FOOT FRACTURE SURGERY  2006 or 2007   right  . HYMENECTOMY    . LUNG BIOPSY  09/28/2012   Procedure: LUNG BIOPSY;  Surgeon: Melrose Nakayama, MD;  Location: [place];  Service: Thoracic;  Laterality: N/A;  lung biopsies  tims three  . SQUAMOUS CELL CARCINOMA EXCISION Left    left arm  . TOTAL HIP ARTHROPLASTY Right 09/10/2015   Procedure: RIGHT TOTAL HIP ARTHROPLASTY ANTERIOR APPROACH;  Surgeon: Paralee Cancel, MD;  Location[facility];  Service: Orthopedics;  Laterality: Right;  . TUBAL LIGATION    . UPPER GI ENDOSCOPY    . VIDEO ASSISTED THORACOSCOPY  09/28/2012   Procedure: VIDEO ASSISTED THORACOSCOPY;  Surgeon: Melrose Nakayama, MD;  Location: [place];  Service: Thoracic;  Laterality: Right;  Marland Kitchen VIDEO BRONCHOSCOPY  11/19/2011   Procedure: VIDEO BRONCHOSCOPY WITH FLUORO;  Surgeon: Brand Males, MD;  Location: [facility] ENDOSCOPY;  Service: Endoscopy;;    MEDICATIONS: . acetaminophen (TYLENOL) 500 MG tablet  . albuterol (PROAIR HFA) 108 (90 Base) MCG/ACT inhaler  . Alpha-D-Galactosidase (BEANO PO)  . Ascorbic Acid (VITAMIN C) 1000 MG tablet  . BREO ELLIPTA 100-25 MCG/INH AEPB  . Calcium-Vitamin D-Vitamin K (VIACTIV) [id number] MG-UNT-MCG CHEW  . chlorpheniramine (CHLOR-TRIMETON) 4 MG tablet  . dicyclomine (BENTYL) 10 MG capsule  . ezetimibe (ZETIA) 10 MG tablet  . fluticasone (FLONASE) 50 MCG/ACT nasal spray  . metFORMIN (GLUCOPHAGE-XR) 500 MG 24 hr tablet  . metoprolol succinate (TOPROL-XL) 50 MG 24 hr tablet  . montelukast (SINGULAIR) 10 MG tablet  . Multiple Vitamin  (MULTIVITAMIN) tablet  . nitroGLYCERIN (NITROSTAT) 0.4 MG SL tablet  . omeprazole (PRILOSEC) 20 MG capsule  . OVER THE COUNTER MEDICATION  . pravastatin (PRAVACHOL) 40 MG tablet  . predniSONE (DELTASONE) 10 MG tablet  . Probiotic Product (PROBIOTIC ADVANCED PO)  . Turmeric 500 MG CAPS   No current facility-administered medications for this encounter.     Maia Plan [facility] Pre-Surgical Testing [phone] 09/29/19  3:06 PM

## 2019-09-29 NOTE — Anesthesia Preprocedure Evaluation (Addendum)
Anesthesia Evaluation  Patient identified by MRN, date of birth, ID band Patient awake    Reviewed: Allergy & Precautions, NPO status , Patient's Chart, lab work & pertinent test results, reviewed documented beta blocker date and time   History of Anesthesia Complications (+) PONV  Airway Mallampati: III  TM Distance: >3 FB Neck ROM: Full    Dental  (+) Teeth Intact, Dental Advisory Given   Pulmonary asthma ,    breath sounds clear to auscultation       Cardiovascular + CAD   Rhythm:Regular Rate:Normal     Neuro/Psych PSYCHIATRIC DISORDERS Depression negative neurological ROS     GI/Hepatic Neg liver ROS, hiatal hernia, GERD  Medicated,  Endo/Other  diabetes, Type 2, Oral Hypoglycemic Agents  Renal/GU negative Renal ROS     Musculoskeletal  (+) Arthritis ,   Abdominal Normal abdominal exam  (+)   Peds  Hematology negative hematology ROS (+)   Anesthesia Other Findings   Reproductive/Obstetrics                            Echo: Left ventricle: The cavity size was normal. Wall thickness was increased in a pattern of mild LVH. Systolic function was normal. The estimated ejection fraction was in the range of 55% to 60%. Wall motion was normal; there were no regional wall motion abnormalities. Doppler parameters are consistent with abnormal left ventricular relaxation (grade 1 diastolic dysfunction). The E/e&' ratio is between 8-15, suggesting indeterminate LV filling pressure. - Mitral valve: Mildly thickened leaflets . There was trivial regurgitation. - Left atrium: The atrium was normal in size. - Tricuspid valve: There was trivial regurgitation. - Pulmonary arteries: PA peak pressure: 21 mm Hg (S). - Inferior vena cava: The vessel was normal in size. The respirophasic diameter changes were in the normal range (>= 50%), consistent with normal central venous  pressure.  Anesthesia Physical Anesthesia Plan  ASA: III  Anesthesia Plan: Spinal   Post-op Pain Management:    Induction: Intravenous  PONV Risk Score and Plan: 4 or greater and Ondansetron, Propofol infusion, Midazolam and Treatment may vary due to age or medical condition  Airway Management Planned: Natural Airway and Simple Face Mask  Additional Equipment: None  Intra-op Plan:   Post-operative Plan:   Informed Consent: I have reviewed the patients History and Physical, chart, labs and discussed the procedure including the risks, benefits and alternatives for the proposed anesthesia with the patient or authorized representative who has indicated his/her understanding and acceptance.       Plan Discussed with: CRNA  Anesthesia Plan Comments: (See PAT note 09/28/2019, Konrad Felix, PA-C)      Anesthesia Quick Evaluation

## 2019-09-30 LAB — NOVEL CORONAVIRUS, NAA (HOSP ORDER, SEND-OUT TO REF LAB; TAT 18-24 HRS): SARS-CoV-2, NAA: NOT DETECTED

## 2019-10-03 ENCOUNTER — Inpatient Hospital Stay (HOSPITAL_COMMUNITY): Payer: Medicare Other

## 2019-10-03 ENCOUNTER — Inpatient Hospital Stay (HOSPITAL_COMMUNITY): Payer: Medicare Other | Admitting: Physician Assistant

## 2019-10-03 ENCOUNTER — Other Ambulatory Visit: Payer: Self-pay

## 2019-10-03 ENCOUNTER — Inpatient Hospital Stay (HOSPITAL_COMMUNITY): Payer: Medicare Other | Admitting: Certified Registered"

## 2019-10-03 ENCOUNTER — Observation Stay (HOSPITAL_COMMUNITY)
Admission: RE | Admit: 2019-10-03 | Discharge: 2019-10-04 | Disposition: A | Discharge status: Home / Self Care | Payer: Medicare Other | Attending: Orthopedic Surgery | Admitting: Orthopedic Surgery

## 2019-10-03 ENCOUNTER — Encounter (HOSPITAL_COMMUNITY)
Admission: RE | Disposition: A | Discharge status: Home / Self Care | Payer: Self-pay | Source: Home / Self Care | Attending: Orthopedic Surgery

## 2019-10-03 ENCOUNTER — Observation Stay (HOSPITAL_COMMUNITY): Payer: Medicare Other

## 2019-10-03 ENCOUNTER — Encounter (HOSPITAL_COMMUNITY): Payer: Self-pay | Admitting: *Deleted

## 2019-10-03 DIAGNOSIS — M8580 Other specified disorders of bone density and structure, unspecified site: Secondary | ICD-10-CM | POA: Diagnosis not present

## 2019-10-03 DIAGNOSIS — I2510 Atherosclerotic heart disease of native coronary artery without angina pectoris: Secondary | ICD-10-CM | POA: Insufficient documentation

## 2019-10-03 DIAGNOSIS — E118 Type 2 diabetes mellitus with unspecified complications: Secondary | ICD-10-CM | POA: Diagnosis not present

## 2019-10-03 DIAGNOSIS — R0600 Dyspnea, unspecified: Secondary | ICD-10-CM | POA: Insufficient documentation

## 2019-10-03 DIAGNOSIS — Z885 Allergy status to narcotic agent status: Secondary | ICD-10-CM | POA: Insufficient documentation

## 2019-10-03 DIAGNOSIS — J4530 Mild persistent asthma, uncomplicated: Secondary | ICD-10-CM | POA: Diagnosis not present

## 2019-10-03 DIAGNOSIS — Z96642 Presence of left artificial hip joint: Secondary | ICD-10-CM | POA: Diagnosis not present

## 2019-10-03 DIAGNOSIS — K449 Diaphragmatic hernia without obstruction or gangrene: Secondary | ICD-10-CM | POA: Insufficient documentation

## 2019-10-03 DIAGNOSIS — Z882 Allergy status to sulfonamides status: Secondary | ICD-10-CM | POA: Insufficient documentation

## 2019-10-03 DIAGNOSIS — J45909 Unspecified asthma, uncomplicated: Secondary | ICD-10-CM | POA: Insufficient documentation

## 2019-10-03 DIAGNOSIS — E782 Mixed hyperlipidemia: Secondary | ICD-10-CM | POA: Insufficient documentation

## 2019-10-03 DIAGNOSIS — Z96649 Presence of unspecified artificial hip joint: Secondary | ICD-10-CM

## 2019-10-03 DIAGNOSIS — K760 Fatty (change of) liver, not elsewhere classified: Secondary | ICD-10-CM | POA: Insufficient documentation

## 2019-10-03 DIAGNOSIS — Z79899 Other long term (current) drug therapy: Secondary | ICD-10-CM | POA: Insufficient documentation

## 2019-10-03 DIAGNOSIS — K219 Gastro-esophageal reflux disease without esophagitis: Secondary | ICD-10-CM | POA: Insufficient documentation

## 2019-10-03 DIAGNOSIS — M1612 Unilateral primary osteoarthritis, left hip: Secondary | ICD-10-CM | POA: Diagnosis not present

## 2019-10-03 DIAGNOSIS — Z7951 Long term (current) use of inhaled steroids: Secondary | ICD-10-CM | POA: Insufficient documentation

## 2019-10-03 DIAGNOSIS — Z96641 Presence of right artificial hip joint: Secondary | ICD-10-CM | POA: Insufficient documentation

## 2019-10-03 DIAGNOSIS — I272 Pulmonary hypertension, unspecified: Secondary | ICD-10-CM | POA: Diagnosis not present

## 2019-10-03 DIAGNOSIS — I493 Ventricular premature depolarization: Secondary | ICD-10-CM | POA: Diagnosis not present

## 2019-10-03 DIAGNOSIS — E7800 Pure hypercholesterolemia, unspecified: Secondary | ICD-10-CM | POA: Insufficient documentation

## 2019-10-03 DIAGNOSIS — Z471 Aftercare following joint replacement surgery: Secondary | ICD-10-CM | POA: Diagnosis not present

## 2019-10-03 DIAGNOSIS — E663 Overweight: Secondary | ICD-10-CM | POA: Diagnosis present

## 2019-10-03 DIAGNOSIS — Z86010 Personal history of colonic polyps: Secondary | ICD-10-CM | POA: Insufficient documentation

## 2019-10-03 DIAGNOSIS — Z9849 Cataract extraction status, unspecified eye: Secondary | ICD-10-CM | POA: Insufficient documentation

## 2019-10-03 DIAGNOSIS — J8410 Pulmonary fibrosis, unspecified: Secondary | ICD-10-CM | POA: Diagnosis not present

## 2019-10-03 DIAGNOSIS — Z7984 Long term (current) use of oral hypoglycemic drugs: Secondary | ICD-10-CM | POA: Insufficient documentation

## 2019-10-03 DIAGNOSIS — Z419 Encounter for procedure for purposes other than remedying health state, unspecified: Secondary | ICD-10-CM

## 2019-10-03 DIAGNOSIS — Z7982 Long term (current) use of aspirin: Secondary | ICD-10-CM | POA: Diagnosis not present

## 2019-10-03 DIAGNOSIS — K589 Irritable bowel syndrome without diarrhea: Secondary | ICD-10-CM | POA: Insufficient documentation

## 2019-10-03 DIAGNOSIS — F329 Major depressive disorder, single episode, unspecified: Secondary | ICD-10-CM | POA: Insufficient documentation

## 2019-10-03 DIAGNOSIS — Z6829 Body mass index (BMI) 29.0-29.9, adult: Secondary | ICD-10-CM | POA: Insufficient documentation

## 2019-10-03 DIAGNOSIS — Z85828 Personal history of other malignant neoplasm of skin: Secondary | ICD-10-CM | POA: Insufficient documentation

## 2019-10-03 DIAGNOSIS — I341 Nonrheumatic mitral (valve) prolapse: Secondary | ICD-10-CM | POA: Diagnosis not present

## 2019-10-03 DIAGNOSIS — E669 Obesity, unspecified: Secondary | ICD-10-CM | POA: Diagnosis not present

## 2019-10-03 DIAGNOSIS — Z8614 Personal history of Methicillin resistant Staphylococcus aureus infection: Secondary | ICD-10-CM | POA: Insufficient documentation

## 2019-10-03 HISTORY — PX: TOTAL HIP ARTHROPLASTY: SHX124

## 2019-10-03 LAB — TYPE AND SCREEN
ABO/RH(D): A NEG
Antibody Screen: POSITIVE
Unit division: 0
Unit division: 0

## 2019-10-03 LAB — GLUCOSE, CAPILLARY
Glucose-Capillary: 93 mg/dL (ref 70–99)
Glucose-Capillary: 112 mg/dL — ABNORMAL HIGH (ref 70–99)
Glucose-Capillary: 139 mg/dL — ABNORMAL HIGH (ref 70–99)

## 2019-10-03 LAB — BPAM RBC
Blood Product Expiration Date: 202012052359
Blood Product Expiration Date: 202012052359
Unit Type and Rh: 600
Unit Type and Rh: 600

## 2019-10-03 SURGERY — ARTHROPLASTY, HIP, TOTAL, ANTERIOR APPROACH
Anesthesia: Spinal | Site: Hip | Laterality: Left

## 2019-10-03 MED ORDER — STERILE WATER FOR IRRIGATION IR SOLN
Status: DC | PRN
  Administered 2019-10-03: 2000 mL

## 2019-10-03 MED ORDER — SODIUM CHLORIDE 0.9 % IV SOLN
INTRAVENOUS | Status: DC
  Administered 2019-10-03 – 2019-10-04 (×2): via INTRAVENOUS

## 2019-10-03 MED ORDER — PROMETHAZINE HCL 25 MG/ML IJ SOLN: 6.2500 mg | INTRAMUSCULAR | Status: DC | PRN

## 2019-10-03 MED ORDER — METOCLOPRAMIDE HCL 5 MG/ML IJ SOLN
5.0000 mg | Freq: Three times a day (TID) | INTRAMUSCULAR | Status: DC | PRN
  Administered 2019-10-03: 5 mg via INTRAVENOUS
  Filled 2019-10-03: qty 2

## 2019-10-03 MED ORDER — METHOCARBAMOL 500 MG PO TABS
500.0000 mg | ORAL_TABLET | Freq: Four times a day (QID) | ORAL | Status: DC | PRN
  Administered 2019-10-03: 500 mg via ORAL
  Filled 2019-10-03: qty 1

## 2019-10-03 MED ORDER — FENTANYL CITRATE (PF) 100 MCG/2ML IJ SOLN
INTRAMUSCULAR | Status: AC
  Administered 2019-10-03: 50 ug via INTRAVENOUS
  Filled 2019-10-03: qty 2

## 2019-10-03 MED ORDER — PHENYLEPHRINE 40 MCG/ML (10ML) SYRINGE FOR IV PUSH (FOR BLOOD PRESSURE SUPPORT)
PREFILLED_SYRINGE | INTRAVENOUS | Status: AC
  Filled 2019-10-03: qty 10

## 2019-10-03 MED ORDER — DEXAMETHASONE SODIUM PHOSPHATE 10 MG/ML IJ SOLN: 10.0000 mg | Freq: Once | INTRAMUSCULAR | Status: DC

## 2019-10-03 MED ORDER — MAGNESIUM CITRATE PO SOLN: 1.0000 | Freq: Once | ORAL | Status: DC | PRN

## 2019-10-03 MED ORDER — INSULIN ASPART 100 UNIT/ML ~~LOC~~ SOLN: 0.0000 [IU] | Freq: Every day | SUBCUTANEOUS | Status: DC

## 2019-10-03 MED ORDER — METHOCARBAMOL 500 MG IVPB - SIMPLE MED
INTRAVENOUS | Status: AC
  Administered 2019-10-03: 500 mg via INTRAVENOUS
  Filled 2019-10-03: qty 50

## 2019-10-03 MED ORDER — HYDROMORPHONE HCL 2 MG PO TABS
ORAL_TABLET | ORAL | Status: AC
  Administered 2019-10-03: 2 mg via ORAL
  Filled 2019-10-03: qty 1

## 2019-10-03 MED ORDER — DIPHENHYDRAMINE HCL 50 MG/ML IJ SOLN
INTRAMUSCULAR | Status: AC
  Filled 2019-10-03: qty 1

## 2019-10-03 MED ORDER — PHENOL 1.4 % MT LIQD: 1.0000 | OROMUCOSAL | Status: DC | PRN

## 2019-10-03 MED ORDER — BUPIVACAINE IN DEXTROSE 0.75-8.25 % IT SOLN
INTRATHECAL | Status: DC | PRN
  Administered 2019-10-03: 1.8 mL via INTRATHECAL

## 2019-10-03 MED ORDER — DEXAMETHASONE SODIUM PHOSPHATE 10 MG/ML IJ SOLN
INTRAMUSCULAR | Status: AC
  Filled 2019-10-03: qty 1

## 2019-10-03 MED ORDER — METHOCARBAMOL 500 MG IVPB - SIMPLE MED
500.0000 mg | Freq: Four times a day (QID) | INTRAVENOUS | Status: DC | PRN
  Administered 2019-10-03: 13:00:00 500 mg via INTRAVENOUS
  Filled 2019-10-03: qty 50

## 2019-10-03 MED ORDER — MIDAZOLAM HCL 2 MG/2ML IJ SOLN
INTRAMUSCULAR | Status: AC
  Filled 2019-10-03: qty 2

## 2019-10-03 MED ORDER — ALBUTEROL SULFATE (2.5 MG/3ML) 0.083% IN NEBU: 2.5000 mg | INHALATION_SOLUTION | RESPIRATORY_TRACT | Status: DC | PRN

## 2019-10-03 MED ORDER — ALBUTEROL SULFATE (2.5 MG/3ML) 0.083% IN NEBU
2.5000 mg | INHALATION_SOLUTION | Freq: Four times a day (QID) | RESPIRATORY_TRACT | Status: DC | PRN
  Administered 2019-10-03: 13:00:00 2.5 mg via RESPIRATORY_TRACT

## 2019-10-03 MED ORDER — CHLORHEXIDINE GLUCONATE 4 % EX LIQD: 60.0000 mL | Freq: Once | CUTANEOUS | Status: DC

## 2019-10-03 MED ORDER — DICYCLOMINE HCL 10 MG PO CAPS: 10.0000 mg | ORAL_CAPSULE | Freq: Two times a day (BID) | ORAL | Status: DC | PRN

## 2019-10-03 MED ORDER — DOCUSATE SODIUM 100 MG PO CAPS
100.0000 mg | ORAL_CAPSULE | Freq: Two times a day (BID) | ORAL | Status: DC
  Administered 2019-10-03 – 2019-10-04 (×2): 100 mg via ORAL
  Filled 2019-10-03 (×2): qty 1

## 2019-10-03 MED ORDER — ACETAMINOPHEN 325 MG PO TABS: 325.0000 mg | ORAL_TABLET | Freq: Once | ORAL | Status: DC | PRN

## 2019-10-03 MED ORDER — DIPHENHYDRAMINE HCL 12.5 MG/5ML PO ELIX: 12.5000 mg | ORAL_SOLUTION | ORAL | Status: DC | PRN

## 2019-10-03 MED ORDER — TRANEXAMIC ACID-NACL 1000-0.7 MG/100ML-% IV SOLN
1000.0000 mg | Freq: Once | INTRAVENOUS | Status: AC
  Administered 2019-10-03: 1000 mg via INTRAVENOUS
  Filled 2019-10-03: qty 100

## 2019-10-03 MED ORDER — EPHEDRINE 5 MG/ML INJ
INTRAVENOUS | Status: AC
  Filled 2019-10-03: qty 10

## 2019-10-03 MED ORDER — PROPOFOL 500 MG/50ML IV EMUL
INTRAVENOUS | Status: AC
  Filled 2019-10-03: qty 50

## 2019-10-03 MED ORDER — FENTANYL CITRATE (PF) 100 MCG/2ML IJ SOLN
INTRAMUSCULAR | Status: DC | PRN
  Administered 2019-10-03: 25 ug via INTRAVENOUS

## 2019-10-03 MED ORDER — BISACODYL 10 MG RE SUPP: 10.0000 mg | Freq: Every day | RECTAL | Status: DC | PRN

## 2019-10-03 MED ORDER — ONDANSETRON HCL 4 MG PO TABS: 4.0000 mg | ORAL_TABLET | Freq: Four times a day (QID) | ORAL | Status: DC | PRN

## 2019-10-03 MED ORDER — FENTANYL CITRATE (PF) 100 MCG/2ML IJ SOLN
INTRAMUSCULAR | Status: AC
  Filled 2019-10-03: qty 2

## 2019-10-03 MED ORDER — MENTHOL 3 MG MT LOZG: 1.0000 | LOZENGE | OROMUCOSAL | Status: DC | PRN

## 2019-10-03 MED ORDER — TRANEXAMIC ACID-NACL 1000-0.7 MG/100ML-% IV SOLN
1000.0000 mg | INTRAVENOUS | Status: AC
  Administered 2019-10-03: 1000 mg via INTRAVENOUS
  Filled 2019-10-03: qty 100

## 2019-10-03 MED ORDER — NON FORMULARY: 20.0000 mg | Freq: Every day | Status: DC

## 2019-10-03 MED ORDER — 0.9 % SODIUM CHLORIDE (POUR BTL) OPTIME
TOPICAL | Status: DC | PRN
  Administered 2019-10-03: 1000 mL

## 2019-10-03 MED ORDER — ACETAMINOPHEN 160 MG/5ML PO SOLN: 325.0000 mg | Freq: Once | ORAL | Status: DC | PRN

## 2019-10-03 MED ORDER — ONDANSETRON HCL 4 MG/2ML IJ SOLN
INTRAMUSCULAR | Status: DC | PRN
  Administered 2019-10-03: 4 mg via INTRAVENOUS

## 2019-10-03 MED ORDER — ONDANSETRON HCL 4 MG/2ML IJ SOLN
4.0000 mg | Freq: Four times a day (QID) | INTRAMUSCULAR | Status: DC | PRN
  Administered 2019-10-03: 4 mg via INTRAVENOUS
  Filled 2019-10-03: qty 2

## 2019-10-03 MED ORDER — EZETIMIBE 10 MG PO TABS
10.0000 mg | ORAL_TABLET | Freq: Every day | ORAL | Status: DC
  Administered 2019-10-04: 10 mg via ORAL
  Filled 2019-10-03: qty 1

## 2019-10-03 MED ORDER — MEPERIDINE HCL 50 MG/ML IJ SOLN: 6.2500 mg | INTRAMUSCULAR | Status: DC | PRN

## 2019-10-03 MED ORDER — ASPIRIN 81 MG PO CHEW
81.0000 mg | CHEWABLE_TABLET | Freq: Two times a day (BID) | ORAL | Status: DC
  Administered 2019-10-03 – 2019-10-04 (×2): 81 mg via ORAL
  Filled 2019-10-03 (×2): qty 1

## 2019-10-03 MED ORDER — LACTATED RINGERS IV SOLN
INTRAVENOUS | Status: DC
  Administered 2019-10-03: 10:00:00 via INTRAVENOUS

## 2019-10-03 MED ORDER — METFORMIN HCL ER 500 MG PO TB24
500.0000 mg | ORAL_TABLET | Freq: Every day | ORAL | Status: DC
  Administered 2019-10-03: 500 mg via ORAL
  Filled 2019-10-03: qty 1

## 2019-10-03 MED ORDER — MIDAZOLAM HCL 5 MG/5ML IJ SOLN
INTRAMUSCULAR | Status: DC | PRN
  Administered 2019-10-03: 1 mg via INTRAVENOUS

## 2019-10-03 MED ORDER — FLUTICASONE FUROATE-VILANTEROL 100-25 MCG/INH IN AEPB
1.0000 | INHALATION_SPRAY | Freq: Every day | RESPIRATORY_TRACT | Status: DC
  Administered 2019-10-04: 1 via RESPIRATORY_TRACT
  Filled 2019-10-03: qty 28

## 2019-10-03 MED ORDER — POLYETHYLENE GLYCOL 3350 17 G PO PACK
17.0000 g | PACK | Freq: Two times a day (BID) | ORAL | Status: DC
  Administered 2019-10-03 – 2019-10-04 (×2): 17 g via ORAL
  Filled 2019-10-03 (×2): qty 1

## 2019-10-03 MED ORDER — PHENYLEPHRINE 40 MCG/ML (10ML) SYRINGE FOR IV PUSH (FOR BLOOD PRESSURE SUPPORT)
PREFILLED_SYRINGE | INTRAVENOUS | Status: DC | PRN
  Administered 2019-10-03: 80 ug via INTRAVENOUS

## 2019-10-03 MED ORDER — ONDANSETRON HCL 4 MG/2ML IJ SOLN
INTRAMUSCULAR | Status: AC
  Filled 2019-10-03: qty 2

## 2019-10-03 MED ORDER — LIDOCAINE 2% (20 MG/ML) 5 ML SYRINGE
INTRAMUSCULAR | Status: DC | PRN
  Administered 2019-10-03: 25 mg via INTRAVENOUS

## 2019-10-03 MED ORDER — METOCLOPRAMIDE HCL 5 MG PO TABS: 5.0000 mg | ORAL_TABLET | Freq: Three times a day (TID) | ORAL | Status: DC | PRN

## 2019-10-03 MED ORDER — HYDROMORPHONE HCL 2 MG PO TABS
2.0000 mg | ORAL_TABLET | ORAL | Status: DC | PRN
  Administered 2019-10-03 (×2): 2 mg via ORAL
  Administered 2019-10-04 (×2): 4 mg via ORAL
  Filled 2019-10-03 (×2): qty 2
  Filled 2019-10-03: qty 1

## 2019-10-03 MED ORDER — PROPOFOL 10 MG/ML IV BOLUS
INTRAVENOUS | Status: AC
  Filled 2019-10-03: qty 20

## 2019-10-03 MED ORDER — FERROUS SULFATE 325 (65 FE) MG PO TABS
325.0000 mg | ORAL_TABLET | Freq: Three times a day (TID) | ORAL | Status: DC
  Administered 2019-10-04 (×2): 325 mg via ORAL
  Filled 2019-10-03 (×2): qty 1

## 2019-10-03 MED ORDER — PREDNISONE 10 MG PO TABS
10.0000 mg | ORAL_TABLET | Freq: Every day | ORAL | Status: DC
  Administered 2019-10-04: 10 mg via ORAL
  Filled 2019-10-03: qty 1
  Filled 2019-10-03: qty 2

## 2019-10-03 MED ORDER — CEFAZOLIN SODIUM-DEXTROSE 2-4 GM/100ML-% IV SOLN
2.0000 g | Freq: Four times a day (QID) | INTRAVENOUS | Status: AC
  Administered 2019-10-03 (×2): 2 g via INTRAVENOUS
  Filled 2019-10-03 (×2): qty 100

## 2019-10-03 MED ORDER — EPHEDRINE SULFATE-NACL 50-0.9 MG/10ML-% IV SOSY
PREFILLED_SYRINGE | INTRAVENOUS | Status: DC | PRN
  Administered 2019-10-03: 10 mg via INTRAVENOUS

## 2019-10-03 MED ORDER — ACETAMINOPHEN 500 MG PO TABS
1000.0000 mg | ORAL_TABLET | Freq: Three times a day (TID) | ORAL | Status: DC
  Administered 2019-10-03 – 2019-10-04 (×4): 1000 mg via ORAL
  Filled 2019-10-03 (×2): qty 2

## 2019-10-03 MED ORDER — FENTANYL CITRATE (PF) 100 MCG/2ML IJ SOLN
25.0000 ug | INTRAMUSCULAR | Status: DC | PRN
  Administered 2019-10-03 (×2): 50 ug via INTRAVENOUS

## 2019-10-03 MED ORDER — ACETAMINOPHEN 10 MG/ML IV SOLN: 1000.0000 mg | Freq: Once | INTRAVENOUS | Status: DC | PRN

## 2019-10-03 MED ORDER — OMEPRAZOLE 20 MG PO CPDR
20.0000 mg | DELAYED_RELEASE_CAPSULE | Freq: Every day | ORAL | Status: DC
  Administered 2019-10-04: 20 mg via ORAL
  Filled 2019-10-03: qty 1

## 2019-10-03 MED ORDER — DIPHENHYDRAMINE HCL 50 MG/ML IJ SOLN
INTRAMUSCULAR | Status: DC | PRN
  Administered 2019-10-03: 12.5 mg via INTRAVENOUS

## 2019-10-03 MED ORDER — ACETAMINOPHEN 500 MG PO TABS
ORAL_TABLET | ORAL | Status: AC
  Filled 2019-10-03: qty 2

## 2019-10-03 MED ORDER — LACTATED RINGERS IV SOLN: INTRAVENOUS | Status: DC

## 2019-10-03 MED ORDER — MONTELUKAST SODIUM 10 MG PO TABS
10.0000 mg | ORAL_TABLET | Freq: Every day | ORAL | Status: DC
  Administered 2019-10-03: 10 mg via ORAL
  Filled 2019-10-03: qty 1

## 2019-10-03 MED ORDER — LIDOCAINE 2% (20 MG/ML) 5 ML SYRINGE
INTRAMUSCULAR | Status: AC
  Filled 2019-10-03: qty 5

## 2019-10-03 MED ORDER — HYDROMORPHONE HCL 1 MG/ML IJ SOLN
0.5000 mg | INTRAMUSCULAR | Status: DC | PRN
  Administered 2019-10-03 (×2): 1 mg via INTRAVENOUS
  Filled 2019-10-03 (×2): qty 1

## 2019-10-03 MED ORDER — PROPOFOL 500 MG/50ML IV EMUL
INTRAVENOUS | Status: DC | PRN
  Administered 2019-10-03: 50 ug/kg/min via INTRAVENOUS

## 2019-10-03 MED ORDER — CEFAZOLIN SODIUM-DEXTROSE 2-4 GM/100ML-% IV SOLN
2.0000 g | INTRAVENOUS | Status: AC
  Administered 2019-10-03: 2 g via INTRAVENOUS
  Filled 2019-10-03: qty 100

## 2019-10-03 MED ORDER — INSULIN ASPART 100 UNIT/ML ~~LOC~~ SOLN
0.0000 [IU] | Freq: Three times a day (TID) | SUBCUTANEOUS | Status: DC
  Administered 2019-10-04: 5 [IU] via SUBCUTANEOUS

## 2019-10-03 MED ORDER — FLUTICASONE PROPIONATE 50 MCG/ACT NA SUSP
2.0000 | Freq: Every day | NASAL | Status: DC
  Administered 2019-10-04: 2 via NASAL
  Filled 2019-10-03: qty 16

## 2019-10-03 MED ORDER — ALBUTEROL SULFATE (2.5 MG/3ML) 0.083% IN NEBU
INHALATION_SOLUTION | RESPIRATORY_TRACT | Status: AC
  Administered 2019-10-03: 2.5 mg via RESPIRATORY_TRACT
  Filled 2019-10-03: qty 3

## 2019-10-03 MED ORDER — DEXAMETHASONE SODIUM PHOSPHATE 10 MG/ML IJ SOLN
10.0000 mg | Freq: Once | INTRAMUSCULAR | Status: AC
  Administered 2019-10-04: 10 mg via INTRAVENOUS
  Filled 2019-10-03: qty 1

## 2019-10-03 MED ORDER — ALUM & MAG HYDROXIDE-SIMETH 200-200-20 MG/5ML PO SUSP: 15.0000 mL | ORAL | Status: DC | PRN

## 2019-10-03 MED ORDER — PRAVASTATIN SODIUM 20 MG PO TABS
40.0000 mg | ORAL_TABLET | Freq: Every evening | ORAL | Status: DC
  Administered 2019-10-03: 40 mg via ORAL
  Filled 2019-10-03: qty 2

## 2019-10-03 MED ORDER — METOPROLOL SUCCINATE ER 25 MG PO TB24
25.0000 mg | ORAL_TABLET | Freq: Every day | ORAL | Status: DC
  Administered 2019-10-04: 25 mg via ORAL
  Filled 2019-10-03: qty 1

## 2019-10-03 SURGICAL SUPPLY — 47 items
ADH SKN CLS APL DERMABOND .7 (GAUZE/BANDAGES/DRESSINGS) ×1
BAG DECANTER FOR FLEXI CONT (MISCELLANEOUS) IMPLANT
BAG SPEC THK2 15X12 ZIP CLS (MISCELLANEOUS)
BAG ZIPLOCK 12X15 (MISCELLANEOUS) IMPLANT
BLADE SAG 18X100X1.27 (BLADE) ×3 IMPLANT
BLADE SURG SZ10 CARB STEEL (BLADE) ×6 IMPLANT
COVER PERINEAL POST (MISCELLANEOUS) ×3 IMPLANT
COVER SURGICAL LIGHT HANDLE (MISCELLANEOUS) ×3 IMPLANT
COVER WAND RF STERILE (DRAPES) ×2 IMPLANT
CUP ACETBLR 48 OD SECTOR II (Hips) ×2 IMPLANT
DERMABOND ADVANCED (GAUZE/BANDAGES/DRESSINGS) ×2
DERMABOND ADVANCED .7 DNX12 (GAUZE/BANDAGES/DRESSINGS) ×1 IMPLANT
DRAPE STERI IOBAN 125X83 (DRAPES) ×3 IMPLANT
DRAPE U-SHAPE 47X51 STRL (DRAPES) ×6 IMPLANT
DRESSING AQUACEL AG SP 3.5X10 (GAUZE/BANDAGES/DRESSINGS) ×1 IMPLANT
DRSG AQUACEL AG SP 3.5X10 (GAUZE/BANDAGES/DRESSINGS) ×3
DURAPREP 26ML APPLICATOR (WOUND CARE) ×3 IMPLANT
ELECT BLADE TIP CTD 4 INCH (ELECTRODE) ×3 IMPLANT
ELECT REM PT RETURN 15FT ADLT (MISCELLANEOUS) ×3 IMPLANT
ELIMINATOR HOLE APEX DEPUY (Hips) ×3 IMPLANT
GLOVE BIO SURGEON STRL SZ 6 (GLOVE) ×6 IMPLANT
GLOVE BIOGEL PI IND STRL 6.5 (GLOVE) ×1 IMPLANT
GLOVE BIOGEL PI IND STRL 7.5 (GLOVE) ×1 IMPLANT
GLOVE BIOGEL PI IND STRL 8.5 (GLOVE) ×1 IMPLANT
GLOVE BIOGEL PI INDICATOR 6.5 (GLOVE) ×2
GLOVE BIOGEL PI INDICATOR 7.5 (GLOVE) ×2
GLOVE BIOGEL PI INDICATOR 8.5 (GLOVE) ×2
GLOVE ECLIPSE 8.0 STRL XLNG CF (GLOVE) ×6 IMPLANT
GLOVE ORTHO TXT STRL SZ7.5 (GLOVE) ×6 IMPLANT
GOWN STRL REUS W/TWL LRG LVL3 (GOWN DISPOSABLE) ×6 IMPLANT
GOWN STRL REUS W/TWL XL LVL3 (GOWN DISPOSABLE) ×3 IMPLANT
HEAD FEMORAL 32 CERAMIC (Hips) ×2 IMPLANT
HOLDER FOLEY CATH W/STRAP (MISCELLANEOUS) ×3 IMPLANT
KIT TURNOVER KIT A (KITS) IMPLANT
PACK ANTERIOR HIP CUSTOM (KITS) ×3 IMPLANT
PINN ALTRX NEUT ID X OD 32X48 ×2 IMPLANT
SCREW 6.5MMX25MM (Screw) ×2 IMPLANT
STEM FEM ACTIS HIGH SZ2 (Stem) ×3 IMPLANT
SUT MNCRL AB 4-0 PS2 18 (SUTURE) ×3 IMPLANT
SUT STRATAFIX 0 PDS 27 VIOLET (SUTURE) ×3
SUT VIC AB 1 CT1 36 (SUTURE) ×9 IMPLANT
SUT VIC AB 2-0 CT1 27 (SUTURE) ×6
SUT VIC AB 2-0 CT1 TAPERPNT 27 (SUTURE) ×2 IMPLANT
SUTURE STRATFX 0 PDS 27 VIOLET (SUTURE) ×1 IMPLANT
TRAY FOLEY MTR SLVR 14FR STAT (SET/KITS/TRAYS/PACK) ×2 IMPLANT
WATER STERILE IRR 1000ML POUR (IV SOLUTION) ×3 IMPLANT
YANKAUER SUCT BULB TIP 10FT TU (MISCELLANEOUS) ×2 IMPLANT

## 2019-10-03 NOTE — Discharge Instructions (Signed)

## 2019-10-03 NOTE — Interval H&P Note (Signed)
History and Physical Interval Note:  10/03/2019 10:09 AM  Rhonda [name]  has presented today for surgery, with the diagnosis of Left hip osteoarthrist.  The various methods of treatment have been discussed with the patient and family. After consideration of risks, benefits and other options for treatment, the patient has consented to  Procedure(s) with comments: TOTAL HIP ARTHROPLASTY ANTERIOR APPROACH (Left) - 70 mins as a surgical intervention.  The patient's history has been reviewed, patient examined, no change in status, stable for surgery.  I have reviewed the patient's chart and labs.  Questions were answered to the patient's satisfaction.     Mauri Pole

## 2019-10-03 NOTE — Anesthesia Postprocedure Evaluation (Signed)
Anesthesia Post Note  Patient: Rhonda [name]  Procedure(s) Performed: TOTAL HIP ARTHROPLASTY ANTERIOR APPROACH (Left Hip)     Patient location during evaluation: PACU Anesthesia Type: Spinal Level of consciousness: oriented and awake and alert Pain management: pain level controlled Vital Signs Assessment: post-procedure vital signs reviewed and stable Respiratory status: spontaneous breathing, respiratory function stable and patient connected to nasal cannula oxygen Cardiovascular status: blood pressure returned to baseline and stable Postop Assessment: no headache, no backache and no apparent nausea or vomiting Anesthetic complications: no    Last Vitals:  Vitals:   10/03/19 1517 10/03/19 1619  BP: 120/63 107/62  Pulse: (!) 58 71  Resp: 18 16  Temp: (!) 36.4 C   SpO2: 100% 99%    Last Pain:  Vitals:   10/03/19 1445  PainSc: 2                  Effie Berkshire

## 2019-10-03 NOTE — Op Note (Signed)
NAME:  Rhonda [name]                ACCOUNT NO.: [id number]      MEDICAL RECORD NO.: [id number]      FACILITY:  [facility]      PHYSICIAN:  Mauri Pole  DATE OF BIRTH:  [birth year]     DATE OF PROCEDURE:  10/03/2019                                 OPERATIVE REPORT         PREOPERATIVE DIAGNOSIS: Left  hip osteoarthritis.      POSTOPERATIVE DIAGNOSIS:  Left hip osteoarthritis.      PROCEDURE:  Left total hip replacement through an anterior approach   utilizing DePuy THR system, component size 75mm pinnacle cup, a size 32+4 neutral   Altrex liner, a size 2 Hi Actis stem with a 32+1 delta ceramic   ball.      SURGEON:  Pietro Cassis. Alvan Dame, M.D.      ASSISTANT:  Griffith Citron, PA-C     ANESTHESIA:  Spinal.      SPECIMENS:  None.      COMPLICATIONS:  None.      BLOOD LOSS:  250 cc     DRAINS:  None.      INDICATION OF THE PROCEDURE:  Rhonda [name] is a 68 y.o. female who had   presented to office for evaluation of left hip pain.  Radiographs revealed   progressive degenerative changes with bone-on-bone   articulation of the  hip joint, including subchondral cystic changes and osteophytes.  The patient had painful limited range of   motion significantly affecting their overall quality of life and function.  The patient was failing to    respond to conservative measures including medications and/or injections and activity modification and at this point was ready   to proceed with more definitive measures.  Consent was obtained for   benefit of pain relief.  Specific risks of infection, DVT, component   failure, dislocation, neurovascular injury, and need for revision surgery were reviewed in the office as well discussion of   the anterior versus posterior approach were reviewed.     PROCEDURE IN DETAIL:  The patient was brought to operative theater.   Once adequate anesthesia, preoperative antibiotics, 2 gm of Ancef, 1 gm of Tranexamic Acid, and 10  mg of Decadron were administered, the patient was positioned supine on the [employer] table.  Once the patient was safely positioned with adequate padding of boney prominences we predraped out the hip, and used fluoroscopy to confirm orientation of the pelvis.      The right hip was then prepped and draped from proximal iliac crest to   mid thigh with a shower curtain technique.      Time-out was performed identifying the patient, planned procedure, and the appropriate extremity.     An incision was then made 2 cm lateral to the   anterior superior iliac spine extending over the orientation of the   tensor fascia lata muscle and sharp dissection was carried down to the   fascia of the muscle.      The fascia was then incised.  The muscle belly was identified and swept   laterally and retractor placed along the superior neck.  Following   cauterization of the circumflex vessels and removing some pericapsular  fat, a second cobra retractor was placed on the inferior neck.  A T-capsulotomy was made along the line of the   superior neck to the trochanteric fossa, then extended proximally and   distally.  Tag sutures were placed and the retractors were then placed   intracapsular.  We then identified the trochanteric fossa and   orientation of my neck cut and then made a neck osteotomy with the femur on traction.  The femoral   head was removed without difficulty or complication.  Traction was let   off and retractors were placed posterior and anterior around the   acetabulum.      The labrum and foveal tissue were debrided.  I began reaming with a 43 mm   reamer and reamed up to 47 mm reamer with good bony bed preparation and a 48 mm  cup was chosen.  The final 48 mm Pinnacle cup was then impacted under fluoroscopy to confirm the depth of penetration and orientation with respect to   Abduction and forward flexion.  A screw was placed into the ilium followed by the hole eliminator.  The final    32+4 neutral Altrex liner was impacted with good visualized rim fit.  The cup was positioned anatomically within the acetabular portion of the pelvis.      At this point, the femur was rolled to 100 degrees.  Further capsule was   released off the inferior aspect of the femoral neck.  I then   released the superior capsule proximally.  With the leg in a neutral position the hook was placed laterally   along the femur under the vastus lateralis origin and elevated manually and then held in position using the hook attachment on the bed.  The leg was then extended and adducted with the leg rolled to 100   degrees of external rotation.  Retractors were placed along the medial calcar and posteriorly over the greater trochanter.  Once the proximal femur was fully   exposed, I used a box osteotome to set orientation.  I then began   broaching with the starting chili pepper broach and passed this by hand and then broached up to 2.  With the 2 broach in place I chose a high offset neck and did several trial reductions.  The offset was appropriate, leg lengths   appeared to be equal best matched with the +1 head ball trial confirmed radiographically.   Given these findings, I went ahead and dislocated the hip, repositioned all   retractors and positioned the right hip in the extended and abducted position.  The final 2 Hi Actis stem was   chosen and it was impacted down to the level of neck cut.  Based on this   and the trial reductions, a final 32+1 delta ceramic ball was chosen and   impacted onto a clean and dry trunnion, and the hip was reduced.  The   hip had been irrigated throughout the case again at this point.  I did   reapproximate the superior capsular leaflet to the anterior leaflet   using #1 Vicryl.  The fascia of the   tensor fascia lata muscle was then reapproximated using #1 Vicryl and #0 Stratafix sutures.  The   remaining wound was closed with 2-0 Vicryl and running 4-0 Monocryl.    The hip was cleaned, dried, and dressed sterilely using Dermabond and   Aquacel dressing.  The patient was then brought   to recovery room in  stable condition tolerating the procedure well.    Griffith Citron, PA-C was present for the entirety of the case involved from   preoperative positioning, perioperative retractor management, general   facilitation of the case, as well as primary wound closure as assistant.            Pietro Cassis Alvan Dame, M.D.        10/03/2019 12:16 PM

## 2019-10-03 NOTE — Anesthesia Procedure Notes (Signed)
Spinal  Start time: 10/03/2019 11:01 AM End time: 10/03/2019 11:03 AM Staffing Anesthesiologist: Effie Berkshire, MD Performed: anesthesiologist  Preanesthetic Checklist Completed: patient identified, site marked, surgical consent, pre-op evaluation, timeout performed, IV checked, risks and benefits discussed and monitors and equipment checked Spinal Block Patient position: sitting Prep: site prepped and draped and DuraPrep Location: L3-4 Injection technique: single-shot Needle Needle type: Pencan  Needle gauge: 24 G Needle length: 10 cm Needle insertion depth: 10 cm Additional Notes Patient tolerated well. No immediate complications.

## 2019-10-03 NOTE — Care Plan (Signed)
Ortho Bundle Case Management Note  Patient Details  Name: Rhonda [name] MRN: [id number] Date of Birth: 09/27/1951  L [facility] on 10-03-19 DCP:  Home with spouse.  2 story home. DME:  No needs.  Has a RW and 3-in-1. PT:  HEP                   DME Arranged:  N/A DME Agency:  NA  HH Arranged:  NA HH Agency:  NA  Additional Comments: Please contact me with any questions of if this plan should need to change.  Marianne Sofia, RN,CCM [facility]  [phone] 10/03/2019, 3:49 PM

## 2019-10-03 NOTE — Transfer of Care (Signed)
Immediate Anesthesia Transfer of Care Note  Patient: Rhonda [name]  Procedure(s) Performed: TOTAL HIP ARTHROPLASTY ANTERIOR APPROACH (Left Hip)  Patient Location: PACU  Anesthesia Type:MAC and Spinal  Level of Consciousness: awake, alert  and oriented  Airway & Oxygen Therapy: Patient Spontanous Breathing and Patient connected to face mask oxygen  Post-op Assessment: Report given to RN and Post -op Vital signs reviewed and stable  Post vital signs: Reviewed and stable  Last Vitals:  Vitals Value Taken Time  BP 100/43 10/03/19 1242  Temp    Pulse 91 10/03/19 1247  Resp 21 10/03/19 1247  SpO2 100 % 10/03/19 1247  Vitals shown include unvalidated device data.  Last Pain:  Vitals:   10/03/19 0944  PainSc: 2       Patients Stated Pain Goal: 3 (17/40/81 4481)  Complications: No apparent anesthesia complications

## 2019-10-04 ENCOUNTER — Other Ambulatory Visit: Payer: Self-pay

## 2019-10-04 DIAGNOSIS — K449 Diaphragmatic hernia without obstruction or gangrene: Secondary | ICD-10-CM | POA: Diagnosis not present

## 2019-10-04 DIAGNOSIS — F329 Major depressive disorder, single episode, unspecified: Secondary | ICD-10-CM | POA: Diagnosis not present

## 2019-10-04 DIAGNOSIS — M1612 Unilateral primary osteoarthritis, left hip: Secondary | ICD-10-CM | POA: Diagnosis not present

## 2019-10-04 DIAGNOSIS — I2510 Atherosclerotic heart disease of native coronary artery without angina pectoris: Secondary | ICD-10-CM | POA: Diagnosis not present

## 2019-10-04 DIAGNOSIS — J45909 Unspecified asthma, uncomplicated: Secondary | ICD-10-CM | POA: Diagnosis not present

## 2019-10-04 DIAGNOSIS — E663 Overweight: Secondary | ICD-10-CM | POA: Diagnosis present

## 2019-10-04 DIAGNOSIS — K219 Gastro-esophageal reflux disease without esophagitis: Secondary | ICD-10-CM | POA: Diagnosis not present

## 2019-10-04 LAB — GLUCOSE, CAPILLARY
Glucose-Capillary: 150 mg/dL — ABNORMAL HIGH (ref 70–99)
Glucose-Capillary: 209 mg/dL — ABNORMAL HIGH (ref 70–99)

## 2019-10-04 LAB — BASIC METABOLIC PANEL
Anion gap: 8 (ref 5–15)
BUN: 11 mg/dL (ref 8–23)
CO2: 25 mmol/L (ref 22–32)
Calcium: 8.3 mg/dL — ABNORMAL LOW (ref 8.9–10.3)
Chloride: 100 mmol/L (ref 98–111)
Creatinine, Ser: 0.91 mg/dL (ref 0.44–1.00)
GFR calc Af Amer: >60 mL/min (ref >60)
GFR calc non Af Amer: >60 mL/min (ref >60)
Glucose, Bld: 149 mg/dL — ABNORMAL HIGH (ref 70–99)
Potassium: 4.1 mmol/L (ref 3.5–5.1)
Sodium: 133 mmol/L — ABNORMAL LOW (ref 135–145)

## 2019-10-04 LAB — CBC
HCT: 37.6 % (ref 36.0–46.0)
Hemoglobin: 11.8 g/dL — ABNORMAL LOW (ref 12.0–15.0)
MCH: 29.5 pg (ref 26.0–34.0)
MCHC: 31.4 g/dL (ref 30.0–36.0)
MCV: 94 fL (ref 80.0–100.0)
Platelets: 149 10*3/uL — ABNORMAL LOW (ref 150–400)
RBC: 4 MIL/uL (ref 3.87–5.11)
RDW: 13.5 % (ref 11.5–15.5)
WBC: 11.9 10*3/uL — ABNORMAL HIGH (ref 4.0–10.5)
nRBC: 0 % (ref 0.0–0.2)

## 2019-10-04 MED ORDER — HYDROMORPHONE HCL 2 MG PO TABS: 2.0000 mg | ORAL_TABLET | Freq: Four times a day (QID) | ORAL | 0 refills | Status: DC | PRN

## 2019-10-04 MED ORDER — ASPIRIN 81 MG PO CHEW: 81.0000 mg | CHEWABLE_TABLET | Freq: Two times a day (BID) | ORAL | 0 refills | Status: AC

## 2019-10-04 MED ORDER — DOCUSATE SODIUM 100 MG PO CAPS: 100.0000 mg | ORAL_CAPSULE | Freq: Two times a day (BID) | ORAL | 0 refills | Status: DC

## 2019-10-04 MED ORDER — FERROUS SULFATE 325 (65 FE) MG PO TABS: 325.0000 mg | ORAL_TABLET | Freq: Three times a day (TID) | ORAL | 0 refills | Status: DC

## 2019-10-04 MED ORDER — METHOCARBAMOL 500 MG PO TABS: 500.0000 mg | ORAL_TABLET | Freq: Four times a day (QID) | ORAL | 0 refills | Status: DC | PRN

## 2019-10-04 MED ORDER — POLYETHYLENE GLYCOL 3350 17 G PO PACK: 17.0000 g | PACK | Freq: Two times a day (BID) | ORAL | 0 refills | Status: DC

## 2019-10-04 NOTE — Progress Notes (Signed)
Physical Therapy Treatment Patient Details Name: Rhonda [name] MRN: [id number] DOB: August 27, 1951 Today's Date: 10/04/2019    History of Present Illness s/p L DA THA    PT Comments    Pt progressing well this afternoon. Able to bear more wt on LLE, incr gait distance as well. Reviewed HEP and progression for later practice at home. Pt is ready to d/c home  from PT standpoint. RN made aware.   Follow Up Recommendations  Follow surgeon's recommendation for DC plan and follow-up therapies     Equipment Recommendations  None recommended by PT    Recommendations for Other Services       Precautions / Restrictions Precautions Precautions: Fall Restrictions Weight Bearing Restrictions: No LLE Weight Bearing: Weight bearing as tolerated    Mobility  Bed Mobility Overal bed mobility: Needs Assistance Bed Mobility: Supine to Sit     Supine to sit: Supervision Sit to supine: Min assist   General bed mobility comments: using gait belt as leg lifter   Transfers Overall transfer level: Needs assistance Equipment used: Rolling walker (2 wheeled) Transfers: Sit to/from Stand Sit to Stand: Supervision         General transfer comment: cues for hand placement   Ambulation/Gait Ambulation/Gait assistance: Supervision Gait Distance (Feet): 60 Feet Assistive device: Rolling walker (2 wheeled) Gait Pattern/deviations: Step-to pattern     General Gait Details: cues for sequence and RW position   Stairs Stairs: Yes Stairs assistance: Min guard;Supervision Stair Management: One rail Right;One rail Left;Forwards;With cane;Step to pattern Number of Stairs: 5 General stair comments: cues for sequence and technique   Wheelchair Mobility    Modified Rankin (Stroke Patients Only)       Balance                                            Cognition Arousal/Alertness: Awake/alert Behavior During Therapy: WFL for tasks assessed/performed Overall  Cognitive Status: Within Functional Limits for tasks assessed                                        Exercises Total Joint Exercises Ankle Circles/Pumps: AROM;Both;10 reps Quad Sets: 10 reps;Both;AROM Heel Slides: AROM;AAROM;Left;10 reps Hip ABduction/ADduction: AROM;Left;5 reps(reviewed in supine and standing ) Long Arc Quad: Left;5 reps;AROM    General Comments        Pertinent Vitals/Pain Pain Assessment: 0-10 Pain Score: 2  Pain Location: left hip Pain Descriptors / Indicators: Sore Pain Intervention(s): Limited activity within patient's tolerance;Monitored during session;Premedicated before session    Home Living Family/patient expects to be discharged to:: Private residence Living Arrangements: Spouse/significant other Available Help at Discharge: Family Type of Home: House Home Access: Stairs to enter Entrance Stairs-Rails: Left Home Layout: Two level Home Equipment: Environmental consultant - 2 wheels      Prior Function Level of Independence: Independent          PT Goals (current goals can now be found in the care plan section) Acute Rehab PT Goals Patient Stated Goal: home PT Goal Formulation: With patient Time For Goal Achievement: 10/11/19 Potential to Achieve Goals: Good Progress towards PT goals: Progressing toward goals    Frequency    7X/week      PT Plan Current plan remains appropriate    Co-evaluation  AM-PAC PT "6 Clicks" Mobility   Outcome Measure  Help needed turning from your back to your side while in a flat bed without using bedrails?: None Help needed moving from lying on your back to sitting on the side of a flat bed without using bedrails?: None Help needed moving to and from a bed to a chair (including a wheelchair)?: None Help needed standing up from a chair using your arms (e.g., wheelchair or bedside chair)?: None Help needed to walk in hospital room?: A Little Help needed climbing 3-5 steps with a  railing? : A Little 6 Click Score: 22    End of Session Equipment Utilized During Treatment: Gait belt Activity Tolerance: Patient tolerated treatment well Patient left: in bed;with call bell/phone within reach;with bed alarm set;Other (comment)(EOB) Nurse Communication: Mobility status PT Visit Diagnosis: Difficulty in walking, not elsewhere classified (R26.2)     Time: [id number] PT Time Calculation (min) (ACUTE ONLY): 23 min  Charges:  $Gait Training: 8-22 mins $Therapeutic Exercise: 8-22 mins                     Kenyon Ana, PT  Pager: [phone] Acute Rehab Dept [facility]): [phone]   10/04/2019    [facility] 10/04/2019, 3:48 PM

## 2019-10-04 NOTE — Evaluation (Signed)
Physical Therapy Evaluation Patient Details Name: Rhonda [name] MRN: [id number] DOB: 06-07-1951 Today's Date: 10/04/2019   History of Present Illness  s/p L DA THA  Clinical Impression  Pt is s/p THA resulting in the deficits listed below (see PT Problem List).  Pt limited by pain and fatigue this am, will see how she does this pm. She has a flight of stairs at home to get up to her bedroom so will need to work on stairs prior to d/c  Pt will benefit from skilled PT to increase their independence and safety with mobility to allow discharge to the venue listed below.      Follow Up Recommendations Follow surgeon's recommendation for DC plan and follow-up therapies    Equipment Recommendations  None recommended by PT    Recommendations for Other Services       Precautions / Restrictions Precautions Precautions: Fall Restrictions Weight Bearing Restrictions: No LLE Weight Bearing: Weight bearing as tolerated      Mobility  Bed Mobility Overal bed mobility: Needs Assistance Bed Mobility: Sit to Supine       Sit to supine: Min assist   General bed mobility comments: assist with LLE  Transfers Overall transfer level: Needs assistance Equipment used: Rolling walker (2 wheeled) Transfers: Sit to/from Stand Sit to Stand: Min assist;Min guard         General transfer comment: min assist to transition to standing, cues for hand placement  Ambulation/Gait Ambulation/Gait assistance: Min guard;Min assist Gait Distance (Feet): 30 Feet Assistive device: Rolling walker (2 wheeled) Gait Pattern/deviations: Step-to pattern;Antalgic     General Gait Details: cues for sequence and RW position  Stairs            Wheelchair Mobility    Modified Rankin (Stroke Patients Only)       Balance                                             Pertinent Vitals/Pain Pain Assessment: 0-10 Pain Score: 4  Pain Location: left hip Pain Descriptors /  Indicators: Sore Pain Intervention(s): Limited activity within patient's tolerance;Monitored during session;Repositioned;Ice applied;Premedicated before session    Home Living Family/patient expects to be discharged to:: Private residence Living Arrangements: Spouse/significant other Available Help at Discharge: Family Type of Home: House Home Access: Stairs to enter Entrance Stairs-Rails: Left Entrance Stairs-Number of Steps: 3 Home Layout: Two level Home Equipment: Environmental consultant - 2 wheels      Prior Function Level of Independence: Independent               Hand Dominance        Extremity/Trunk Assessment   Upper Extremity Assessment Upper Extremity Assessment: Overall WFL for tasks assessed    Lower Extremity Assessment Lower Extremity Assessment: LLE deficits/detail LLE Deficits / Details: ankle WFL. knee and hip grossly 2+/5, limited by post op pain       Communication   Communication: No difficulties  Cognition Arousal/Alertness: Awake/alert Behavior During Therapy: WFL for tasks assessed/performed Overall Cognitive Status: Within Functional Limits for tasks assessed                                        General Comments      Exercises Total Joint Exercises Ankle Circles/Pumps: AROM;Both;10 reps  Quad Sets: 10 reps;Both;AROM Heel Slides: AROM;AAROM;Left;10 reps   Assessment/Plan    PT Assessment Patient needs continued PT services  PT Problem List Decreased strength;Decreased range of motion;Decreased activity tolerance;Decreased balance;Decreased mobility;Pain;Decreased knowledge of use of DME       PT Treatment Interventions DME instruction;Therapeutic exercise;Gait training;Functional mobility training;Therapeutic activities;Patient/family education;Stair training    PT Goals (Current goals can be found in the Care Plan section)  Acute Rehab PT Goals Patient Stated Goal: home PT Goal Formulation: With patient Time For Goal  Achievement: 10/11/19 Potential to Achieve Goals: Good    Frequency 7X/week   Barriers to discharge        Co-evaluation               AM-PAC PT "6 Clicks" Mobility  Outcome Measure Help needed turning from your back to your side while in a flat bed without using bedrails?: A Little Help needed moving from lying on your back to sitting on the side of a flat bed without using bedrails?: A Little Help needed moving to and from a bed to a chair (including a wheelchair)?: A Little Help needed standing up from a chair using your arms (e.g., wheelchair or bedside chair)?: A Little Help needed to walk in hospital room?: A Little Help needed climbing 3-5 steps with a railing? : A Lot 6 Click Score: 17    End of Session Equipment Utilized During Treatment: Gait belt Activity Tolerance: Patient tolerated treatment well Patient left: in bed;with bed alarm set;with call bell/phone within reach   PT Visit Diagnosis: Difficulty in walking, not elsewhere classified (R26.2)    Time: 1007-1030 PT Time Calculation (min) (ACUTE ONLY): 23 min   Charges:   PT Evaluation $PT Eval Low Complexity: 1 Low PT Treatments $Gait Training: 8-22 mins        Kenyon Ana, PT  Pager: [phone] Acute Rehab Dept [facility]): [phone]   10/04/2019   [facility] 10/04/2019, 12:14 PM

## 2019-10-04 NOTE — Progress Notes (Signed)
     Subjective: 1 Day Post-Op Procedure(s) (LRB): TOTAL HIP ARTHROPLASTY ANTERIOR APPROACH (Left)   Patient reports pain as mild now, which is better than it was just after the block wore off.  Analgesic medication covering her pain at this time.  No reported events throughout the night.  We discussed the procedure expectations moving forward.  Patient feels that this hip is already doing better than her previous hip replacement in the postop time.  Patient is ready be discharged home, she does well with therapy.  Patient will follow-up in the clinic in 2 weeks.  Patient knows to call with any questions or concerns.     Objective:   VITALS:   Vitals:   10/04/19 0116 10/04/19 0536  BP: 117/69 118/60  Pulse: 92 92  Resp: 18 20  Temp: 98.6 F (37 C) 99.1 F (37.3 C)  SpO2: 97% 96%    Dorsiflexion/Plantar flexion intact Incision: dressing C/D/I No cellulitis present Compartment soft  LABS Recent Labs    10/04/19 0216  HGB 11.8*  HCT 37.6  WBC 11.9*  PLT 149*    Recent Labs    10/04/19 0216  NA 133*  K 4.1  BUN 11  CREATININE 0.91  GLUCOSE 149*     Assessment/Plan: 1 Day Post-Op Procedure(s) (LRB): TOTAL HIP ARTHROPLASTY ANTERIOR APPROACH (Left) Foley cath DC'd Advance diet Up with therapy D/C IV fluids Discharge home Follow up in 2 weeks at [facility] Follow up with OLIN,Luciana Cammarata D in 2 weeks.  Contact information:  [facility] [address] [id number] [id number]    Overweight (BMI 25-29.9) Estimated body mass index is 29.51 kg/m as calculated from the following:   Height as of this encounter: 4\' 11"  (1.499 m).   Weight as of this encounter: 66.3 kg. Patient also counseled that weight may inhibit the healing process Patient counseled that losing weight will help with future health issues        West Pugh. Joette Schmoker   PAC  10/04/2019, 8:47 AM

## 2019-10-04 NOTE — Progress Notes (Signed)
Patient discharged to home w/ family. Given all belongings, instructions. Verbalized understanding of instructions. Escorted to pov via w/c. 

## 2019-10-05 ENCOUNTER — Encounter (HOSPITAL_COMMUNITY): Payer: Self-pay | Admitting: Orthopedic Surgery

## 2019-10-05 NOTE — Telephone Encounter (Signed)
Handicap placard signed by MR and was mailed to pt. Nothing further needed.

## 2019-10-05 NOTE — Discharge Summary (Signed)
Physician Discharge Summary  Patient ID: SHADAY KNICELEY MRN: [id number] DOB/AGE: [birth year] 68 y.o.  Admit date: 10/03/2019 Discharge date: 10/04/2019   Procedures:  Procedure(s) (LRB): TOTAL HIP ARTHROPLASTY ANTERIOR APPROACH (Left)  Attending Physician:  Dr. Paralee Cancel   Admission Diagnoses:   Left hip primary OA / pain  Discharge Diagnoses:  Active Problems:   S/P hip replacement, left   Overweight (BMI 25.0-29.9)  Past Medical History:  Diagnosis Date  . Allergy   . Arthritis    history spinal stenosis. osteoarthritis right hip  . Asthma   . Cataract   . Coronary artery calcification seen on CAT scan 08/19/2017   >300 on CT scan 08/2017  . DDD (degenerative disc disease), thoracic   . Depression   . DOE (dyspnea on exertion)    a. 04/2010 Lexi MV EF 71%, no ischemia/infarct;    . Fatty liver   . GERD (gastroesophageal reflux disease)   . H/O steroid therapy    Steroid use orally over 4 yrs- for Lung Fibrosis  . Heart palpitations 02/28/2015  . Helicobacter pylori ab+   . Hemorrhoids   . Hiatal hernia   . High cholesterol   . History of chronic bronchitis    as child  . History of migraine   . History of MRSA infection   . Hyperlipidemia, mixed 08/19/2017  . Hyperplastic colon polyp 2007  . IBS (irritable bowel syndrome)   . Inguinal hernia    right  . Insulin resistance    past  . Interstitial lung disease ([place])   . MVP (mitral valve prolapse)    Posterior mitral valve leaflet with mild MR  . Pneumonia   . Pneumonitis, hypersensitivity ([place])    a. 09/2012 s/p Bx - ? 2/2 bird, mold, oil paint exposure ->on steroids, followed by pulm.  Marland Kitchen PONV (postoperative nausea and vomiting)   . Pre-diabetes    takes Metformin  . Pulmonary fibrosis (HCC)    Dr. Chase Caller follows- stable at present  . PVC (premature ventricular contraction) 08/19/2017  . Rapid heart rate    Dr. Radford Pax follows- last visit Epic note 9'16  . Squamous cell carcinoma of skin   .  Tinnitus, right ear     HPI:     Rhonda [name], 68 y.o. female, has a history of pain and functional disability in the left hip(s) due to arthritis and patient has failed non-surgical conservative treatments for greater than 12 weeks to include NSAID's and/or analgesics and activity modification.  Onset of symptoms was gradual starting 3 years ago with gradually worsening course since that time.The patient noted prior procedures of the hip to include arthroplasty on the right hip(s).  Patient currently rates pain in the left hip at 7 out of 10 with activity. Patient has night pain, worsening of pain with activity and weight bearing, trendelenberg gait, pain that interfers with activities of daily living and pain with passive range of motion. Patient has evidence of periarticular osteophytes and joint space narrowing by imaging studies. This condition presents safety issues increasing the risk of falls. There is no current active infection.  Risks, benefits and expectations were discussed with the patient.  Risks including but not limited to the risk of anesthesia, blood clots, nerve damage, blood vessel damage, failure of the prosthesis, infection and up to and including death.  Patient understand the risks, benefits and expectations and wishes to proceed with surgery.   PCP: Hayden Rasmussen, MD   Discharged Condition:  good  Hospital Course:  Patient underwent the above stated procedure on 10/03/2019. Patient tolerated the procedure well and brought to the recovery room in good condition and subsequently to the floor.  POD #1 BP: 118/60 ; Pulse: 92 ; Temp: 99.1 F (37.3 C) ; Resp: 20 Patient reports pain as mild now, which is better than it was just after the block wore off.  Analgesic medication covering her pain at this time.  No reported events throughout the night.  We discussed the procedure expectations moving forward.  Patient feels that this hip is already doing better than her previous  hip replacement in the postop time.  Patient is ready be discharged home. Dorsiflexion/plantar flexion intact, incision: dressing C/D/I, no cellulitis present and compartment soft.   LABS  Basename    HGB     11.8  HCT     37.6    Discharge Exam: General appearance: alert, cooperative and no distress Extremities: Homans sign is negative, no sign of DVT, no edema, redness or tenderness in the calves or thighs and no ulcers, gangrene or trophic changes  Disposition:  Home with follow up in 2 weeks   Follow-up Information    Paralee Cancel, MD. Go on 10/18/2019.   Specialty: Orthopedic Surgery Why: You are scheduled for a post-operative appointment on 10-18-19 at 3:15 pm.  Contact information: [address] [place] [address] [phone]           Discharge Instructions    Call MD / Call 911   Complete by: As directed    If you experience chest pain or shortness of breath, CALL 911 and be transported to the hospital emergency room.  If you develope a fever above 101 F, pus (white drainage) or increased drainage or redness at the wound, or calf pain, call your surgeon's office.   Change dressing   Complete by: As directed    Maintain surgical dressing until follow up in the clinic. If the edges start to pull up, may reinforce with tape. If the dressing is no longer working, may remove and cover with gauze and tape, but must keep the area dry and clean.  Call with any questions or concerns.   Constipation Prevention   Complete by: As directed    Drink plenty of fluids.  Prune juice may be helpful.  You may use a stool softener, such as Colace (over the counter) 100 mg twice a day.  Use MiraLax (over the counter) for constipation as needed.   Diet - low sodium heart healthy   Complete by: As directed    Discharge instructions   Complete by: As directed    Maintain surgical dressing until follow up in the clinic. If the edges start to pull up, may reinforce with  tape. If the dressing is no longer working, may remove and cover with gauze and tape, but must keep the area dry and clean.  Follow up in 2 [facility] at [facility]. Call with any questions or concerns.   Increase activity slowly as tolerated   Complete by: As directed    Weight bearing as tolerated with assist device (walker, cane, etc) as directed, use it as long as suggested by your surgeon or therapist, typically at least 4-6 weeks.   TED hose   Complete by: As directed    Use stockings (TED hose) for 2 weeks on both leg(s).  You may remove them at night for sleeping.      Allergies  as of 10/04/2019      Reactions   Atorvastatin Other (See Comments)   Leg pain   Betadine [povidone Iodine] Other (See Comments)   blisters   Codeine Nausea And Vomiting   Garlic Diarrhea   Hydrocodone Nausea And Vomiting   Onion Diarrhea   Rosuvastatin Other (See Comments)   Leg pain   Shellfish Allergy Nausea And Vomiting   Sulfonamide Derivatives Other (See Comments)   headaches      Medication List    TAKE these medications   acetaminophen 500 MG tablet Commonly known as: TYLENOL Take 500 mg by mouth every 6 (six) hours as needed.   albuterol 108 (90 Base) MCG/ACT inhaler Commonly known as: ProAir HFA Inhale 2 puffs into the lungs every 4 (four) hours as needed for wheezing. Or coughing spells.  You may use 2 Puffs 5-10 minutes before exercise.   aspirin 81 MG chewable tablet Commonly known as: Aspirin Childrens Chew 1 tablet (81 mg total) by mouth 2 (two) times daily. Take for 4 weeks, then resume regular dose.   BEANO PO Take 1 tablet by mouth as needed (if eating onion or garlic).   Breo Ellipta 100-25 MCG/INH Aepb Generic drug: fluticasone furoate-vilanterol INHALE 1 PUFF BY MOUTH EVERY DAY What changed: See the new instructions.   chlorpheniramine 4 MG tablet Commonly known as: CHLOR-TRIMETON Take 2 mg by mouth 2 (two) times daily. Takes half tablet twice daily    dicyclomine 10 MG capsule Commonly known as: Bentyl Take 1 capsule (10 mg total) by mouth 2 (two) times daily as needed for spasms.   docusate sodium 100 MG capsule Commonly known as: Colace Take 1 capsule (100 mg total) by mouth 2 (two) times daily.   ezetimibe 10 MG tablet Commonly known as: ZETIA Take 1 tablet (10 mg total) by mouth daily. Please keep upcoming appt for future refills. Thank you.   ferrous sulfate 325 (65 FE) MG tablet Commonly known as: FerrouSul Take 1 tablet (325 mg total) by mouth 3 (three) times daily with meals for 14 days.   fluticasone 50 MCG/ACT nasal spray Commonly known as: FLONASE SPRAY 2 SPRAYS INTO EACH NOSTRIL EVERY DAY What changed: See the new instructions.   HYDROmorphone 2 MG tablet Commonly known as: DILAUDID Take 1-2 tablets (2-4 mg total) by mouth every 6 (six) hours as needed.   metFORMIN 500 MG 24 hr tablet Commonly known as: GLUCOPHAGE-XR Take 500 mg by mouth daily with supper.   methocarbamol 500 MG tablet Commonly known as: Robaxin Take 1 tablet (500 mg total) by mouth every 6 (six) hours as needed for muscle spasms.   metoprolol succinate 50 MG 24 hr tablet Commonly known as: TOPROL-XL Take 25 mg by mouth daily.   montelukast 10 MG tablet Commonly known as: SINGULAIR TAKE 1 TABLET BY MOUTH EVERYDAY AT BEDTIME What changed: See the new instructions.   multivitamin tablet Take 1 tablet by mouth daily.   nitroGLYCERIN 0.4 MG SL tablet Commonly known as: NITROSTAT Place 1 tablet (0.4 mg total) under the tongue every 5 (five) minutes as needed for chest pain.   omeprazole 20 MG capsule Commonly known as: PRILOSEC Take 1 capsule (20 mg total) by mouth daily.   OVER THE COUNTER MEDICATION Take 2 tablets by mouth daily.   polyethylene glycol 17 g packet Commonly known as: MIRALAX / GLYCOLAX Take 17 g by mouth 2 (two) times daily.   pravastatin 40 MG tablet Commonly known as: PRAVACHOL Take 40 mg by  mouth every  evening.   predniSONE 10 MG tablet Commonly known as: DELTASONE TAKE 1 TABLET (10 MG TOTAL) BY MOUTH DAILY WITH BREAKFAST.   PROBIOTIC ADVANCED PO Take 2 capsules by mouth daily.   Turmeric 500 MG Caps Take 500 mg by mouth daily.   Viactiv [id number] MG-UNT-MCG Chew Generic drug: Calcium-Vitamin D-Vitamin K Chew 2 tablets by mouth daily.   vitamin C 1000 MG tablet Take 1,000 mg by mouth daily.            Discharge Care Instructions  (From admission, onward)         Start     Ordered   10/04/19 0000  Change dressing    Comments: Maintain surgical dressing until follow up in the clinic. If the edges start to pull up, may reinforce with tape. If the dressing is no longer working, may remove and cover with gauze and tape, but must keep the area dry and clean.  Call with any questions or concerns.   10/04/19 [id number]           Signed: West Pugh. Merida Alcantar   PA-C  10/05/2019, 8:36 AM

## 2019-10-07 LAB — TYPE AND SCREEN
ABO/RH(D): A NEG
Antibody Screen: POSITIVE
Unit division: 0
Unit division: 0

## 2019-10-07 LAB — BPAM RBC
Blood Product Expiration Date: 202012052359
Blood Product Expiration Date: 202012052359
Unit Type and Rh: 600
Unit Type and Rh: 600

## 2019-10-13 ENCOUNTER — Telehealth: Payer: Self-pay | Admitting: Cardiology

## 2019-10-13 NOTE — Telephone Encounter (Signed)
OK 

## 2019-10-13 NOTE — Telephone Encounter (Signed)
New message    Patient request to no longer see Dr Radford Pax, transfer care to Dr Percival Spanish

## 2019-10-13 NOTE — Telephone Encounter (Signed)
Fine with me

## 2019-10-16 NOTE — Telephone Encounter (Signed)
Okay to change providers. Will route to scheduling.

## 2019-11-01 NOTE — Progress Notes (Signed)
Cardiology Office Note   Date:  11/02/2019   ID:  Rhonda [name], DOB [birth year], MRN [id number]  PCP:  Hayden Rasmussen, MD  Cardiologist:   Fransico Him, MD   Chief Complaint  Patient presents with  . Shortness of Breath      History of Present Illness: Rhonda [name] is a 68 y.o. female who presents for follow up of coronary calcium and PVCs.  She had a normal nuclear stress test in 2016.  She came back in prior to hip replacement earlier this year.  She was having some symptoms and was again sent for a nuclear study that was negative for any evidence of ischemia.  She had her hip and she was doing relatively well from this.  She has premature ventricular contractions but she does not notice this.  She has chronic interstitial lung disease and has done relatively well from this and is stable.  She does not have home O2.  Her calcium score was 343.  She is not having any chest pressure, neck or arm discomfort.  She is not having any presyncope or syncope.  She is not having any PND or orthopnea.  She is not back to exercising since her hip that she is going to start.    Past Medical History:  Diagnosis Date  . Allergy   . Arthritis    history spinal stenosis. osteoarthritis right hip  . Asthma   . Cataract   . Coronary artery calcification seen on CAT scan 08/19/2017   >300 on CT scan 08/2017  . DDD (degenerative disc disease), thoracic   . Depression   . DOE (dyspnea on exertion)    a. 04/2010 Lexi MV EF 71%, no ischemia/infarct;    . Fatty liver   . GERD (gastroesophageal reflux disease)   . H/O steroid therapy    Steroid use orally over 4 yrs- for Lung Fibrosis  . Heart palpitations 02/28/2015  . Helicobacter pylori ab+   . Hemorrhoids   . Hiatal hernia   . High cholesterol   . History of chronic bronchitis    as child  . History of migraine   . History of MRSA infection   . Hyperlipidemia, mixed 08/19/2017  . Hyperplastic colon polyp 2007  . IBS  (irritable bowel syndrome)   . Inguinal hernia    right  . Insulin resistance    past  . Interstitial lung disease ([place])   . MVP (mitral valve prolapse)    Posterior mitral valve leaflet with mild MR  . Pneumonia   . Pneumonitis, hypersensitivity ([place])    a. 09/2012 s/p Bx - ? 2/2 bird, mold, oil paint exposure ->on steroids, followed by pulm.  Marland Kitchen PONV (postoperative nausea and vomiting)   . Pre-diabetes    takes Metformin  . Pulmonary fibrosis ([facility])    Dr. Chase Caller follows- stable at present  . PVC (premature ventricular contraction) 08/19/2017  . Rapid heart rate    Dr. Radford Pax follows- last visit Epic note 9'16  . Squamous cell carcinoma of skin   . Tinnitus, right ear     Past Surgical History:  Procedure Laterality Date  . [place] STUDY N/A 02/21/2018   Procedure: [place] STUDY;  Surgeon: Mauri Pole, MD;  Location: [facility];  Service: Endoscopy;  Laterality: N/A;  . BREAST BIOPSY Right 2009   BIOPSY, pt denies  . BREAST BIOPSY Left 2003   Benign   . CATARACT EXTRACTION Left   .  CESAREAN SECTION    . COLONOSCOPY    . ESOPHAGEAL MANOMETRY N/A 02/21/2018   Procedure: ESOPHAGEAL MANOMETRY (EM);  Surgeon: Mauri Pole, MD;  Location: [facility];  Service: Endoscopy;  Laterality: N/A;  . FOOT FRACTURE SURGERY  2006 or 2007   right  . HYMENECTOMY    . LUNG BIOPSY  09/28/2012   Procedure: LUNG BIOPSY;  Surgeon: Melrose Nakayama, MD;  Location: [facility];  Service: Thoracic;  Laterality: N/A;  lung biopsies tims three  . SQUAMOUS CELL CARCINOMA EXCISION Left    left arm  . TOTAL HIP ARTHROPLASTY Right 09/10/2015   Procedure: RIGHT TOTAL HIP ARTHROPLASTY ANTERIOR APPROACH;  Surgeon: Paralee Cancel, MD;  Location: [facility];  Service: Orthopedics;  Laterality: Right;  . TOTAL HIP ARTHROPLASTY Left 10/03/2019   Procedure: TOTAL HIP ARTHROPLASTY ANTERIOR APPROACH;  Surgeon: Paralee Cancel, MD;  Location: [facility];  Service: Orthopedics;  Laterality: Left;  70 mins   . TUBAL LIGATION    . UPPER GI ENDOSCOPY    . VIDEO ASSISTED THORACOSCOPY  09/28/2012   Procedure: VIDEO ASSISTED THORACOSCOPY;  Surgeon: Melrose Nakayama, MD;  Location: [facility];  Service: Thoracic;  Laterality: Right;  Marland Kitchen VIDEO BRONCHOSCOPY  11/19/2011   Procedure: VIDEO BRONCHOSCOPY WITH FLUORO;  Surgeon: Brand Males, MD;  Location[facility];  Service: Endoscopy;;     Current Outpatient Medications  Medication Sig Dispense Refill  . acetaminophen (TYLENOL) 500 MG tablet Tylenol Extra Strength    . albuterol (PROAIR HFA) 108 (90 Base) MCG/ACT inhaler Inhale 2 puffs into the lungs every 4 (four) hours as needed for wheezing. Or coughing spells.  You may use 2 Puffs 5-10 minutes before exercise. 1 Inhaler 3  . Alpha-D-Galactosidase (BEANO PO) Take 1 tablet by mouth as needed (if eating onion or garlic).     . Ascorbic Acid (VITAMIN C) 100 MG CHEW Vitamin C    . Ascorbic Acid (VITAMIN C) 1000 MG tablet Take 1,000 mg by mouth daily.    Marland Kitchen aspirin (ASPIRIN CHILDRENS) 81 MG chewable tablet Chew 1 tablet (81 mg total) by mouth 2 (two) times daily. Take for 4 weeks, then resume regular dose. 60 tablet 0  . Calcium-Vitamin D-Vitamin K (VIACTIV) [id number] MG-UNT-MCG CHEW Chew 2 tablets by mouth daily.     . chlorpheniramine (CHLOR-TRIMETON) 4 MG tablet Take 2 mg by mouth 2 (two) times daily. Takes half tablet twice daily    . Cholecalciferol (VITAMIN D) 10 MCG/ML LIQD Vitamin D    . dicyclomine (BENTYL) 10 MG capsule Take 1 capsule (10 mg total) by mouth 2 (two) times daily as needed for spasms. 60 capsule 1  . ezetimibe (ZETIA) 10 MG tablet Take 1 tablet (10 mg total) by mouth daily. Please keep upcoming appt for future refills. Thank you. 90 tablet 3  . fluticasone (FLONASE) 50 MCG/ACT nasal spray SPRAY 2 SPRAYS INTO EACH NOSTRIL EVERY DAY (Patient taking differently: Place 2 sprays into both nostrils daily. ) 48 mL 1  . fluticasone furoate-vilanterol (BREO ELLIPTA) 100-25 MCG/INH AEPB  Breo Ellipta 100 mcg-25 mcg/dose powder for inhalation  INHALE 1 PUFF BY MOUTH EVERY DAY    . IRON-VITAMIN C PO Take by mouth.    . metFORMIN (GLUCOPHAGE-XR) 500 MG 24 hr tablet Take 500 mg by mouth daily with supper.    . metoprolol succinate (TOPROL-XL) 50 MG 24 hr tablet Take 25 mg by mouth daily.     . montelukast (SINGULAIR) 10 MG tablet TAKE 1 TABLET BY  MOUTH EVERYDAY AT BEDTIME (Patient taking differently: Take 10 mg by mouth at bedtime. ) 90 tablet 2  . Multiple Vitamin (MULTIVITAMIN) tablet Take 1 tablet by mouth daily.    . Naproxen Sodium (ALEVE) 220 MG CAPS Aleve    . nitroGLYCERIN (NITROSTAT) 0.4 MG SL tablet Place 1 tablet (0.4 mg total) under the tongue every 5 (five) minutes as needed for chest pain. 25 tablet 12  . nystatin-triamcinolone (MYCOLOG II) cream nystatin-triamcinolone 100,000 unit/g-0.1 % topical cream    . omeprazole (PRILOSEC) 20 MG capsule Take 1 capsule (20 mg total) by mouth daily. 30 capsule 5  . OVER THE COUNTER MEDICATION Take 2 tablets by mouth daily.    . pravastatin (PRAVACHOL) 40 MG tablet Take 40 mg by mouth every evening.     . predniSONE (DELTASONE) 10 MG tablet TAKE 1 TABLET (10 MG TOTAL) BY MOUTH DAILY WITH BREAKFAST. 90 tablet 1  . Probiotic Product (PROBIOTIC ADVANCED PO) Take 2 capsules by mouth daily.     . Turmeric 500 MG CAPS Take 500 mg by mouth daily.      No current facility-administered medications for this visit.    Allergies:   Atorvastatin, Betadine [povidone iodine], Codeine, Garlic, Hydrocodone, Onion, Rosuvastatin, Shellfish allergy, Sulfa antibiotics, and Sulfonamide derivatives    ROS:  Please see the history of present illness.   Otherwise, review of systems are positive for none.   All other systems are reviewed and negative.    PHYSICAL EXAM: VS:  BP 137/79   Pulse 82   Temp (!) 96.8 F (36 C)   Ht 4\' 11"  (1.499 m)   Wt 145 lb 3.2 oz (65.9 kg)   SpO2 94%   BMI 29.33 kg/m  , BMI Body mass index is 29.33 kg/m.  GENERAL:  Well appearing NECK:  No jugular venous distention, waveform within normal limits, carotid upstroke brisk and symmetric, no bruits, no thyromegaly LYMPHATICS:  No cervical, inguinal adenopathy LUNGS: Diffuse fine crackles, occasional wheeze BACK:  No CVA tenderness CHEST:  Unremarkable HEART:  PMI not displaced or sustained,S1 and S2 within normal limits, no S3, no S4, no clicks, no rubs, NO murmurs ABD:  Flat, positive bowel sounds normal in frequency in pitch, no bruits, no rebound, no guarding, no midline pulsatile mass, no hepatomegaly, no splenomegaly EXT:  2 plus pulses throughout, no edema, no cyanosis no clubbing   EKG:  EKG is not ordered today.    Recent Labs: 12/08/2018: ALT 14 10/04/2019: BUN 11; Creatinine, Ser 0.91; Hemoglobin 11.8; Platelets 149; Potassium 4.1; Sodium 133    Lipid Panel    Component Value Date/Time   CHOL 177 12/08/2018 0917   TRIG 131 12/08/2018 0917   HDL 68 12/08/2018 0917   CHOLHDL 2.6 12/08/2018 0917   CHOLHDL 3.5 02/26/2015 1540   VLDL 37 02/26/2015 1540   LDLCALC 83 12/08/2018 0917      Wt Readings from Last 3 Encounters:  11/02/19 145 lb 3.2 oz (65.9 kg)  10/03/19 146 lb 2 oz (66.3 kg)  09/28/19 146 lb 2 oz (66.3 kg)      Other studies Reviewed: Additional studies/ records that were reviewed today include: Lexiscan Myoview.. Review of the above records demonstrates:  Please see elsewhere in the note.     ASSESSMENT AND PLAN:  Coronary artery calcifications on CT -   She had a low risk nuclear study.     No further testing is indicated.  She should continue with risk reduction.  I have  suggested the aspirin.   PVCs -   We talked about this and she is not having any symptoms.  No change in therapy.  Dyslipidemia -      LDL 83 on 1//16/2020.  She will continue on Pravastatin 40mg  daily and Zetia 10mg  daily.   Covid Education -    We discussed the vaccine.     Current medicines are reviewed at length with the  patient today.  The patient does not have concerns regarding medicines.  The following changes have been made:  no change  Labs/ tests ordered today include: None No orders of the defined types were placed in this encounter.    Disposition:   FU with me in 12 months.     Signed, Minus Breeding, MD  11/02/2019 3:15 PM    [facility]

## 2019-11-02 ENCOUNTER — Ambulatory Visit (INDEPENDENT_AMBULATORY_CARE_PROVIDER_SITE_OTHER): Payer: Medicare Other | Admitting: Cardiology

## 2019-11-02 ENCOUNTER — Other Ambulatory Visit: Payer: Self-pay

## 2019-11-02 ENCOUNTER — Encounter: Payer: Self-pay | Admitting: Cardiology

## 2019-11-02 VITALS — BP 137/79 | HR 82 | Temp 96.8°F | Ht 59.0 in | Wt 145.2 lb

## 2019-11-02 DIAGNOSIS — R931 Abnormal findings on diagnostic imaging of heart and coronary circulation: Secondary | ICD-10-CM

## 2019-11-02 DIAGNOSIS — I493 Ventricular premature depolarization: Secondary | ICD-10-CM

## 2019-11-02 DIAGNOSIS — Z7189 Other specified counseling: Secondary | ICD-10-CM

## 2019-11-02 DIAGNOSIS — E785 Hyperlipidemia, unspecified: Secondary | ICD-10-CM

## 2019-11-02 DIAGNOSIS — I341 Nonrheumatic mitral (valve) prolapse: Secondary | ICD-10-CM

## 2019-11-02 DIAGNOSIS — I2510 Atherosclerotic heart disease of native coronary artery without angina pectoris: Secondary | ICD-10-CM | POA: Diagnosis not present

## 2019-11-02 DIAGNOSIS — I272 Pulmonary hypertension, unspecified: Secondary | ICD-10-CM

## 2019-11-02 NOTE — Patient Instructions (Signed)
Medication Instructions:  Your physician recommends that you continue on your current medications as directed. Please refer to the Current Medication list given to you today. *If you need a refill on your cardiac medications before your next appointment, please call your pharmacy*  Lab Work: none If you have labs (blood work) drawn today and your tests are completely normal, you will receive your results only by: Marland Kitchen MyChart Message (if you have MyChart) OR . A paper copy in the mail If you have any lab test that is abnormal or we need to change your treatment, we will call you to review the results.  Testing/Procedures: none  Follow-Up: At [facility], you and your health needs are our priority.  As part of our continuing mission to provide you with exceptional heart care, we have created designated Provider Care Teams.  These Care Teams include your primary Cardiologist (physician) and Advanced Practice Providers (APPs -  Physician Assistants and Nurse Practitioners) who all work together to provide you with the care you need, when you need it.  Your next appointment:   12 month(s)  The format for your next appointment:   Either In Person or Virtual  Provider:   Minus Breeding, MD

## 2019-11-03 ENCOUNTER — Other Ambulatory Visit (HOSPITAL_COMMUNITY): Payer: Medicare Other

## 2019-11-14 ENCOUNTER — Other Ambulatory Visit: Payer: Self-pay | Admitting: Internal Medicine

## 2019-12-06 ENCOUNTER — Telehealth: Payer: Self-pay | Admitting: Adult Health

## 2019-12-06 NOTE — Telephone Encounter (Signed)
Left message for patient to call back  

## 2019-12-06 NOTE — Telephone Encounter (Signed)
She needs to have the video visit with Rhonda [name]. If Rhonda [name] feels she needs to be seen she will make arrangements after she has the video visit. Make sure she knows to seek emergency care if she gets worse.

## 2019-12-06 NOTE — Telephone Encounter (Signed)
Message sent to app of the day, Eric Form, NP.  Judson Roch, this patient already has a video visit scheduled for 12/07/19 at 10:30 with Rexene Edison, NP.  The patient stated she wanted to come to the clinic for Tammy to listen to her lungs.  However, she has as cough that is productive with clear congestion. Has allergies to milk, yogurt, cheese (which she started consuming again 6 weeks ago). Has itching that started yesterday.   The patient noticed that since oil painting in the home in December for about 2 weeks after that she noticed that if she walked a short distance her O2 would drop to 82% - 88%, but it would take about 15 seconds to recover. The last time the O2 dropped was in the first week of January 2021. Patient has been using Mucinex. Recommended she drink water after she ingests dairy.  She would like to come into be seen, but I stressed that because of increase in covid numbers and trying to keep everyone safe, we have been recommending video visits.  Based on the information that she provided, should this appointment remain as a video visit. Or does she need to come in to be seen?

## 2019-12-07 ENCOUNTER — Other Ambulatory Visit: Payer: Self-pay

## 2019-12-07 ENCOUNTER — Ambulatory Visit (INDEPENDENT_AMBULATORY_CARE_PROVIDER_SITE_OTHER): Payer: Medicare Other | Admitting: Adult Health

## 2019-12-07 ENCOUNTER — Encounter: Payer: Self-pay | Admitting: Adult Health

## 2019-12-07 DIAGNOSIS — J849 Interstitial pulmonary disease, unspecified: Secondary | ICD-10-CM | POA: Diagnosis not present

## 2019-12-07 DIAGNOSIS — J209 Acute bronchitis, unspecified: Secondary | ICD-10-CM | POA: Diagnosis not present

## 2019-12-07 DIAGNOSIS — J679 Hypersensitivity pneumonitis due to unspecified organic dust: Secondary | ICD-10-CM

## 2019-12-07 DIAGNOSIS — Z7952 Long term (current) use of systemic steroids: Secondary | ICD-10-CM

## 2019-12-07 MED ORDER — AZITHROMYCIN 250 MG PO TABS: ORAL_TABLET | ORAL | 0 refills | Status: AC

## 2019-12-07 NOTE — Telephone Encounter (Signed)
Pt returned call - she is going to be a televisit Will sign off

## 2019-12-07 NOTE — Telephone Encounter (Signed)
LMTCB x2 for pt. She is not scheduled for a video visit, her appointment is an in person visit.

## 2019-12-07 NOTE — Progress Notes (Signed)
Virtual Visit via Telephone Note  I connected with Rhonda [name] on 12/07/19 at 10:30 AM EST by telephone and verified that I am speaking with the correct person using two identifiers.  Location: Patient: Home Provider: Office   I discussed the limitations, risks, security and privacy concerns of performing an evaluation and management service by telephone and the availability of in person appointments. I also discussed with the patient that there may be a patient responsible charge related to this service. The patient expressed understanding and agreed to proceed.   History of Present Illness: 69 year old female followed for interstitial lung disease-chronic hypersensitivity pneumonitis on daily steroids with prednisone 5 mg daily.  Today's televisit is an acute office visit.  Patient complains increased cough, congestion and dyspnea for last 4 weeks . Has noticed that oxygen levels drop intermittently with activity and walking.  Patient has noticed that over the last couple weeks cough is picked up.  She is more congested with thick mucus.  She has had no fever no loss of taste or smell.  Says that she has been self quarantine and extremely careful during the pandemic.  She has no known sick contacts.  Patient says that she has been on oxygen in the past but greater than 3 years ago.  She says her oxygen levels do drop with activity but mildly into the upper 80s.  However has been checking her oxygen levels via her home pulse oximeter.  Has noticed that on occasion her oxygen levels dropped down into the mid 80s.  This does not happen all the time but she has been noticing it more often.  She does feel short of breath with heavy activity and exertion.  She denies any chest pain orthopnea PND hemoptysis or increased leg swelling. She is on chronic steroids at 5 mg daily.  She says that 3 weeks ago that she increase her prednisone to 10 mg.  She remains on Breo daily.  Patient Active Problem  List   Diagnosis Date Noted  . Overweight (BMI 25.0-29.9) 10/04/2019  . S/P hip replacement, left 10/03/2019  . Pulmonary fibrosis ([place]) 04/25/2018  . Yeast dermatitis 04/25/2018  . Coronary artery calcification seen on CAT scan 08/19/2017  . Mixed hyperlipidemia 08/19/2017  . PVC (premature ventricular contraction) 08/19/2017  . Hypoxemia 03/18/2017  . Erroneous encounter - disregard 02/17/2017  . Irritable larynx syndrome 01/13/2016  . Allergic rhinitis due to pollen 01/06/2016  . Mild persistent asthma 01/06/2016  . Allergy with anaphylaxis due to food 01/06/2016  . Current use of beta blocker 01/06/2016  . Gastroesophageal reflux disease 01/06/2016  . Tachycardia 01/06/2016  . Acute bronchitis 10/11/2015  . Obese 09/11/2015  . S/P right THA, AA 09/10/2015  . Pre-diabetes 08/22/2015  . Preop pulmonary/respiratory exam 07/08/2015  . MVP (mitral valve prolapse)   . Pulmonary HTN ([place])   . Heart palpitations 02/28/2015  . Right-sided chest wall pain 12/19/2014  . Cough 12/19/2014  . Wheezing 10/20/2014  . ILD (interstitial lung disease) ([place]) 09/20/2014  . Upper respiratory infection 08/25/2014  . Irritable bowel syndrome (IBS) 02/08/2014  . Spontaneous pneumothorax- Right 08/08/2013  . High risk medication use 01/03/2013  . Osteopenia 10/28/2012  . Metabolic syndrome 0000000  . 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 oil pain and ? mold in house 10/29/2011  . Snoring 10/29/2011  . Dyslipidemia 04/02/2010  . ABNORMAL EKG 04/02/2010  . DEPRESSION 03/27/2010  . MITRAL REGURGITATION 03/27/2010  . DYSPNEA ON EXERTION 03/27/2010   Current  Outpatient Medications on File Prior to Visit  Medication Sig Dispense Refill  . acetaminophen (TYLENOL) 500 MG tablet Tylenol Extra Strength    . albuterol (PROAIR HFA) 108 (90 Base) MCG/ACT inhaler Inhale 2 puffs into the lungs every 4 (four) hours as needed for wheezing. Or coughing spells.  You may use 2 Puffs 5-10  minutes before exercise. 1 Inhaler 3  . Alpha-D-Galactosidase (BEANO PO) Take 1 tablet by mouth as needed (if eating onion or garlic).     . Ascorbic Acid (VITAMIN C) 1000 MG tablet Take 1,000 mg by mouth daily.    . Calcium-Vitamin D-Vitamin K (VIACTIV) [id number] MG-UNT-MCG CHEW Chew 2 tablets by mouth daily.     . chlorpheniramine (CHLOR-TRIMETON) 4 MG tablet Take 2 mg by mouth 2 (two) times daily. Takes half tablet twice daily    . Cholecalciferol (VITAMIN D) 10 MCG/ML LIQD Vitamin D    . dicyclomine (BENTYL) 10 MG capsule Take 1 capsule (10 mg total) by mouth 2 (two) times daily as needed for spasms. 60 capsule 1  . ezetimibe (ZETIA) 10 MG tablet Take 1 tablet (10 mg total) by mouth daily. Please keep upcoming appt for future refills. Thank you. 90 tablet 3  . fluticasone (FLONASE) 50 MCG/ACT nasal spray SPRAY 2 SPRAYS INTO EACH NOSTRIL EVERY DAY (Patient taking differently: Place 2 sprays into both nostrils daily. ) 48 mL 1  . fluticasone furoate-vilanterol (BREO ELLIPTA) 100-25 MCG/INH AEPB Breo Ellipta 100 mcg-25 mcg/dose powder for inhalation  INHALE 1 PUFF BY MOUTH EVERY DAY    . IRON-VITAMIN C PO Take by mouth.    . metFORMIN (GLUCOPHAGE-XR) 500 MG 24 hr tablet Take 500 mg by mouth daily with supper.    . metoprolol succinate (TOPROL-XL) 50 MG 24 hr tablet Take 25 mg by mouth daily.     . montelukast (SINGULAIR) 10 MG tablet TAKE 1 TABLET BY MOUTH EVERYDAY AT BEDTIME (Patient taking differently: Take 10 mg by mouth at bedtime. ) 90 tablet 2  . Multiple Vitamin (MULTIVITAMIN) tablet Take 1 tablet by mouth daily.    . Naproxen Sodium (ALEVE) 220 MG CAPS Aleve    . nystatin-triamcinolone (MYCOLOG II) cream nystatin-triamcinolone 100,000 unit/g-0.1 % topical cream    . omeprazole (PRILOSEC) 20 MG capsule Take 1 capsule (20 mg total) by mouth daily. 30 capsule 5  . OVER THE COUNTER MEDICATION Take 2 tablets by mouth daily.    . pravastatin (PRAVACHOL) 40 MG tablet Take 40 mg by mouth every  evening.     . predniSONE (DELTASONE) 10 MG tablet TAKE 1 TABLET (10 MG TOTAL) BY MOUTH DAILY WITH BREAKFAST. 90 tablet 1  . Probiotic Product (PROBIOTIC ADVANCED PO) Take 2 capsules by mouth daily.     . Turmeric 500 MG CAPS Take 500 mg by mouth daily.     . nitroGLYCERIN (NITROSTAT) 0.4 MG SL tablet Place 1 tablet (0.4 mg total) under the tongue every 5 (five) minutes as needed for chest pain. 25 tablet 12   No current facility-administered medications on file prior to visit.      Observations/Objective: 12/07/2019 blood pressure 113/66, heart rate 72, weight 142.8, O2 saturation 96% on room air per patient's recordings  Speaks in full sentences with no audible wheezing.   Assessment and Plan: Acute bronchitis with mild flare of ILD/chronic hypersensitivity pneumonitis. Patient will return for a follow-up chest x-ray next week.  We will check in office walk test for oxygen levels. Have advised her to keep a oxygen  log.  May need to begin home oxygen. We will treat with a short course of antibiotics.  Added mucolytic's.  Plan  Patient Instructions  Zpack take as directed .  Mucinex DM Twice daily  As needed  Cough/congestion  Increase prednisone 20mg  daily  Activity as tolerated.  Keep oxygen level log .  Continue on Breo daily, rinse after use Albuterol inhaler as needed Follow up in 1 week and As needed   Please contact office for sooner follow up if symptoms do not improve or worsen or seek emergency care       Follow Up Instructions:    I discussed the assessment and treatment plan with the patient. The patient was provided an opportunity to ask questions and all were answered. The patient agreed with the plan and demonstrated an understanding of the instructions.   The patient was advised to call back or seek an in-person evaluation if the symptoms worsen or if the condition fails to improve as anticipated.  I provided 24 minutes of non-face-to-face time during this  encounter.   Rexene Edison, NP

## 2019-12-07 NOTE — Patient Instructions (Addendum)
Zpack take as directed .  Mucinex DM Twice daily  As needed  Cough/congestion  Increase prednisone 20mg  daily  Activity as tolerated.  Keep oxygen level log .  Continue on Breo daily, rinse after use Albuterol inhaler as needed Follow up in 1 week and As needed   Please contact office for sooner follow up if symptoms do not improve or worsen or seek emergency care

## 2019-12-14 ENCOUNTER — Ambulatory Visit (INDEPENDENT_AMBULATORY_CARE_PROVIDER_SITE_OTHER): Payer: Medicare Other | Admitting: Adult Health

## 2019-12-14 ENCOUNTER — Ambulatory Visit (INDEPENDENT_AMBULATORY_CARE_PROVIDER_SITE_OTHER): Payer: Medicare Other

## 2019-12-14 ENCOUNTER — Other Ambulatory Visit: Payer: Self-pay

## 2019-12-14 ENCOUNTER — Encounter: Payer: Self-pay | Admitting: Adult Health

## 2019-12-14 VITALS — BP 124/68 | HR 74 | Temp 97.4°F | Ht 60.0 in | Wt 145.0 lb

## 2019-12-14 DIAGNOSIS — J209 Acute bronchitis, unspecified: Secondary | ICD-10-CM | POA: Diagnosis not present

## 2019-12-14 DIAGNOSIS — J849 Interstitial pulmonary disease, unspecified: Secondary | ICD-10-CM

## 2019-12-14 DIAGNOSIS — J9611 Chronic respiratory failure with hypoxia: Secondary | ICD-10-CM | POA: Diagnosis not present

## 2019-12-14 DIAGNOSIS — J40 Bronchitis, not specified as acute or chronic: Secondary | ICD-10-CM | POA: Diagnosis not present

## 2019-12-14 NOTE — Assessment & Plan Note (Signed)
Exertional desaturations.  Patient has had this for some time.  However seems to be persistent since recent exacerbation.  She is now improving however continues to have desaturations below 88% on room air.  We will add in portable oxygen 2 L with activity with O2 saturation goals greater than 88 to 90% She would like a portable oxygen concentrator that is light weight she likes to be very active and likes to exercise this will help with her mobility and keeping her from being deconditioned

## 2019-12-14 NOTE — Addendum Note (Signed)
Addended by: Parke Poisson E on: 12/14/2019 02:39 PM   Modules accepted: Orders

## 2019-12-14 NOTE — Patient Instructions (Signed)
Begin Oxygen 2l/m with activity  Order for POC .  Mucinex DM Twice daily  As needed  Cough/congestion  Increase prednisone 10mg  daily for 2 weeks then 5mg  daily  Activity as tolerated.  Continue on Breo daily, rinse after use Albuterol inhaler as needed Follow up in 2 months with Dr. Chase Caller and As needed   Please contact office for sooner follow up if symptoms do not improve or worsen or seek emergency care

## 2019-12-14 NOTE — Progress Notes (Signed)
_0  ID: Rhonda [name], female    DOB: [birth year], 69 y.o.   MRN: [id number]  Chief Complaint  Patient presents with  . Follow-up    ILD     Referring provider: Hayden Rasmussen, MD  HPI: 69 year old female followed for interstitial lung disease-chronic hypersensitivity pneumonitis (possible irritants -bird/oil paint/house mold)  on daily steroids with prednisone 5 mg daily  TEST/EVENTS :  # Hypersensitivity Pneumonitis and ILD             - Potential etiologies - cockateil x 2 for 18 years through end 2012. 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 Denies mold but lives in house built in [place] with a "weird baselment: and has humidifier             - first noted on CT chest 10/15/09 following trip to [place] (PE negative) but not described in 2003 CT chest report             - autoimmune panel: 10/23/11: Negative             -  Uderwennt bronch 11/19/11  - non diagnostic             - VATS Nov 2013 (done after initially refusing)- [place]. However, there is worrying trend of UIP pattern in the Upper lobes.   Previous [facility] transplant evaluation (felt too early )   12/14/2019 Follow up : ILD, Bronchitis  Patient returns for a 1 week follow-up.  Patient was seen via telemedicine last week for a 4-week history of cough congestion and increased dyspnea.  Patient has previously been on oxygen in the past greater than 3 years ago.  She has not been using oxygen and periodically monitored oxygen levels with minimal decreases in oxygen with activities.  However over the last few weeks had noticed that her oxygen to a drop into the mid to upper 80s with activity.  Patient was given a Z-Pak and a prednisone burst .  She is on a baseline prednisone dose at 5 mg daily.  Her prednisone was increased to 20 mg daily.  Since last week patient says she is starting to feel better.  Cough  and congestion have decreased.  She has no discolored mucus.  No fever no loss of taste or smell.  She says her breathing has also improved and she has noticed less desaturations.  Ambulatory walk test in the office today shows O2 saturations dropped to 87% on room air with walking.  With a quick rebound at rest.  Was able to maintain O2 saturation at 2 L walking greater than 90%. Chest x-ray today shows chronic diffuse bilateral fibrotic interstitial pulmonary opacities slightly more conspicuous.  No focal airspace opacity. She denies any hemoptysis chest pain orthopnea PND or increased leg swelling.  Appetite is good.  Allergies  Allergen Reactions  . Atorvastatin Other (See Comments)    Leg pain  . Betadine [Povidone Iodine] Other (See Comments)    blisters  . Codeine Nausea And Vomiting  . Garlic Diarrhea  . Hydrocodone Nausea And Vomiting  . Onion Diarrhea  . Rosuvastatin Other (See Comments)    Leg pain  . Shellfish Allergy Nausea And Vomiting  . Sulfa Antibiotics   . Sulfonamide Derivatives Other (See Comments)    headaches    Immunization History  Administered Date(s) Administered  . Fluad Quad(high Dose 65+) 07/17/2019  .  Hepatitis A 05/23/2010  . Hepatitis B 06/23/2006  . Influenza Split 08/11/2012, 08/14/2017  . Influenza Whole 09/08/2011  . Influenza, High Dose Seasonal PF 08/24/2016, 07/24/2017, 08/31/2018  . Influenza,inj,Quad PF,6+ Mos 09/07/2013  . Influenza-Unspecified 09/26/2014, 07/27/2015  . MMR 05/23/2010  . Pneumococcal Conjugate-13 03/15/2014  . Pneumococcal Polysaccharide-23 01/11/2018  . Td 02/22/2003  . Tdap 10/28/2012  . Zoster 12/04/2010  . Zoster Recombinat (Shingrix) 02/15/2017, 05/17/2017    Past Medical History:  Diagnosis Date  . Allergy   . Arthritis    history spinal stenosis. osteoarthritis right hip  . Asthma   . Cataract   . Coronary artery calcification seen on CAT scan 08/19/2017   >300 on CT scan 08/2017  . DDD (degenerative  disc disease), thoracic   . Depression   . DOE (dyspnea on exertion)    a. 04/2010 Lexi MV EF 71%, no ischemia/infarct;    . Fatty liver   . GERD (gastroesophageal reflux disease)   . H/O steroid therapy    Steroid use orally over 4 yrs- for Lung Fibrosis  . Heart palpitations 02/28/2015  . Helicobacter pylori ab+   . Hemorrhoids   . Hiatal hernia   . High cholesterol   . History of chronic bronchitis    as child  . History of migraine   . History of MRSA infection   . Hyperlipidemia, mixed 08/19/2017  . Hyperplastic colon polyp 2007  . IBS (irritable bowel syndrome)   . Inguinal hernia    right  . Insulin resistance    past  . Interstitial lung disease ([place])   . MVP (mitral valve prolapse)    Posterior mitral valve leaflet with mild MR  . Pneumonia   . Pneumonitis, hypersensitivity ([place])    a. 09/2012 s/p Bx - ? 2/2 bird, mold, oil paint exposure ->on steroids, followed by pulm.  Marland Kitchen PONV (postoperative nausea and vomiting)   . Pre-diabetes    takes Metformin  . Pulmonary fibrosis (HCC)    Dr. Chase Caller follows- stable at present  . PVC (premature ventricular contraction) 08/19/2017  . Rapid heart rate    Dr. Radford Pax follows- last visit Epic note 9'16  . Squamous cell carcinoma of skin   . Tinnitus, right ear     Tobacco History: Social History   Tobacco Use  Smoking Status Never Smoker  Smokeless Tobacco Never Used  Tobacco Comment   pt states she experimented in college   Counseling given: Not Answered Comment: pt states she experimented in college   Outpatient Medications Prior to Visit  Medication Sig Dispense Refill  . acetaminophen (TYLENOL) 500 MG tablet Tylenol Extra Strength    . albuterol (PROAIR HFA) 108 (90 Base) MCG/ACT inhaler Inhale 2 puffs into the lungs every 4 (four) hours as needed for wheezing. Or coughing spells.  You may use 2 Puffs 5-10 minutes before exercise. 1 Inhaler 3  . Alpha-D-Galactosidase (BEANO PO) Take 1 tablet by mouth as needed  (if eating onion or garlic).     . Ascorbic Acid (VITAMIN C) 1000 MG tablet Take 1,000 mg by mouth daily.    Marland Kitchen aspirin EC 81 MG tablet Take 81 mg by mouth daily.    . Calcium-Vitamin D-Vitamin K (VIACTIV) 967-893-81 MG-UNT-MCG CHEW Chew 2 tablets by mouth daily.     . chlorpheniramine (CHLOR-TRIMETON) 4 MG tablet Take 2 mg by mouth 2 (two) times daily. Takes half tablet twice daily    . ezetimibe (ZETIA) 10 MG tablet Take 1 tablet (10  mg total) by mouth daily. Please keep upcoming appt for future refills. Thank you. 90 tablet 3  . fluticasone (FLONASE) 50 MCG/ACT nasal spray SPRAY 2 SPRAYS INTO EACH NOSTRIL EVERY DAY (Patient taking differently: Place 2 sprays into both nostrils daily. ) 48 mL 1  . fluticasone furoate-vilanterol (BREO ELLIPTA) 100-25 MCG/INH AEPB Breo Ellipta 100 mcg-25 mcg/dose powder for inhalation  INHALE 1 PUFF BY MOUTH EVERY DAY    . IRON-VITAMIN C PO Take by mouth.    . metFORMIN (GLUCOPHAGE-XR) 500 MG 24 hr tablet Take 500 mg by mouth daily with supper.    . metoprolol succinate (TOPROL-XL) 50 MG 24 hr tablet Take 25 mg by mouth daily.     . montelukast (SINGULAIR) 10 MG tablet TAKE 1 TABLET BY MOUTH EVERYDAY AT BEDTIME (Patient taking differently: Take 10 mg by mouth at bedtime. ) 90 tablet 2  . Multiple Vitamin (MULTIVITAMIN) tablet Take 1 tablet by mouth daily.    Marland Kitchen nystatin-triamcinolone (MYCOLOG II) cream nystatin-triamcinolone 100,000 unit/g-0.1 % topical cream    . omeprazole (PRILOSEC) 20 MG capsule Take 1 capsule (20 mg total) by mouth daily. 30 capsule 5  . pravastatin (PRAVACHOL) 40 MG tablet Take 40 mg by mouth every evening.     . predniSONE (DELTASONE) 10 MG tablet TAKE 1 TABLET (10 MG TOTAL) BY MOUTH DAILY WITH BREAKFAST. 90 tablet 1  . Probiotic Product (PROBIOTIC ADVANCED PO) Take 2 capsules by mouth daily.     . Turmeric 500 MG CAPS Take 500 mg by mouth daily.     Marland Kitchen dicyclomine (BENTYL) 10 MG capsule Take 1 capsule (10 mg total) by mouth 2 (two) times  daily as needed for spasms. (Patient not taking: Reported on 12/14/2019) 60 capsule 1  . nitroGLYCERIN (NITROSTAT) 0.4 MG SL tablet Place 1 tablet (0.4 mg total) under the tongue every 5 (five) minutes as needed for chest pain. 25 tablet 12  . Cholecalciferol (VITAMIN D) 10 MCG/ML LIQD Vitamin D    . Naproxen Sodium (ALEVE) 220 MG CAPS Aleve    . OVER THE COUNTER MEDICATION Take 2 tablets by mouth daily.     No facility-administered medications prior to visit.     Review of Systems:   Constitutional:   No  weight loss, night sweats,  Fevers, chills,  +fatigue, or  lassitude.  HEENT:   No headaches,  Difficulty swallowing,  Tooth/dental problems, or  Sore throat,                No sneezing, itching, ear ache, nasal congestion, post nasal drip,   CV:  No chest pain,  Orthopnea, PND, swelling in lower extremities, anasarca, dizziness, palpitations, syncope.   GI  No heartburn, indigestion, abdominal pain, nausea, vomiting, diarrhea, change in bowel habits, loss of appetite, bloody stools.   Resp:   No chest wall deformity  Skin: no rash or lesions.  GU: no dysuria, change in color of urine, no urgency or frequency.  No flank pain, no hematuria   MS:  No joint pain or swelling.  No decreased range of motion.  No back pain.    Physical Exam  BP 124/68 (BP Location: Left Arm, Cuff Size: Normal)   Pulse 74   Temp (!) 97.4 F (36.3 C) (Temporal)   Ht 5' (1.524 m)   Wt 145 lb (65.8 kg)   SpO2 94% Comment: RA  BMI 28.32 kg/m   GEN: A/Ox3; pleasant , NAD, elderly   HEENT:  [place]/AT, NOSE-clear, THROAT-clear, no  lesions, no postnasal drip or exudate noted.   NECK:  Supple w/ fair ROM; no JVD; normal carotid impulses w/o bruits; no thyromegaly or nodules palpated; no lymphadenopathy.    RESP bibasilar crackles right greater than left no accessory muscle use, no dullness to percussion  CARD:  RRR, no m/r/g, no peripheral edema, pulses intact, no cyanosis or clubbing.  GI:   Soft &  nt; nml bowel sounds; no organomegaly or masses detected.   Musco: Warm bil, no deformities or joint swelling noted.   Neuro: alert, no focal deficits noted.    Skin: Warm, no lesions or rashes    Lab Results:  CBC  BMET  BNP No results found for: BNP  ProBNP No results found for: PROBNP  Imaging: DG Chest 2 View  Result Date: 12/14/2019 CLINICAL DATA:  Bronchitis EXAM: CHEST - 2 VIEW COMPARISON:  12/26/2018 FINDINGS: The heart size and mediastinal contours are within normal limits. There is diffuse bilateral fibrotic interstitial pulmonary opacity, which is slightly more conspicuous than on prior radiographs dated 12/26/2018. Disc degenerative disease of the thoracic spine. IMPRESSION: Diffuse bilateral fibrotic interstitial pulmonary opacity which is slightly more conspicuous than on prior radiographs dated 12/26/2018, this increased prominence may reflect nonspecific atypical infection or inflammation. No focal airspace opacity. Electronically Signed   By: Eddie Candle M.D.   On: 12/14/2019 11:07      PFT Results Latest Ref Rng & Units 05/10/2019 08/16/2018 04/13/2018 01/11/2018 01/25/2017 04/29/2016 12/16/2015  FVC-Pre L 1.62 1.65 1.35 1.61 1.69 - 1.42  FVC-Predicted Pre % 67 67 55 62 65 67 56  FVC-Post L - - - - - 1.78 1.54  FVC-Predicted Post % - - - - - 67 61  Pre FEV1/FVC % % 89 90 93 91 90 88 90  Post FEV1/FCV % % - - - - - 90 90  FEV1-Pre L 1.45 1.49 1.26 1.46 1.53 1.55 1.28  FEV1-Predicted Pre % 80 81 68 75 77 77 66  FEV1-Post L - - - - - 1.61 1.39  DLCO UNC% % 63 52 57 56 55 64 57  DLCO COR %Predicted % 93 88 104 95 92 104 105  TLC L - - - - - 2.98 2.86  TLC % Predicted % - - - - - 66 66  RV % Predicted % - - - - - 67 75    Lab Results  Component Value Date   NITRICOXIDE 10 01/13/2016        Assessment & Plan:   ILD (interstitial lung disease) Chronic hypersensitivity pneumonitis with mild flare associated with acute bronchitis.  Chest x-ray does reflect  some increased interstitial markings consistent with acute bronchitis.  No acute consolidation noted. Patient is clinically improving will continue prednisone at higher dose for the next 2 weeks and then decrease back to her baseline at 5 mg.  She has an upcoming PFT in 6 weeks.  If decline in lung function/DLCO consider repeat high-resolution CT chest for progression  Plan  Patient Instructions  Begin Oxygen 2l/m with activity  Order for POC .  Mucinex DM Twice daily  As needed  Cough/congestion  Increase prednisone 12m daily for 2 weeks then 534mdaily  Activity as tolerated.  Continue on Breo daily, rinse after use Albuterol inhaler as needed Follow up in 2 months with Dr. RaChase Callernd As needed   Please contact office for sooner follow up if symptoms do not improve or worsen or seek emergency care  Chronic respiratory failure with hypoxia (HCC) Exertional desaturations.  Patient has had this for some time.  However seems to be persistent since recent exacerbation.  She is now improving however continues to have desaturations below 88% on room air.  We will add in portable oxygen 2 L with activity with O2 saturation goals greater than 88 to 90% She would like a portable oxygen concentrator that is light weight she likes to be very active and likes to exercise this will help with her mobility and keeping her from being deconditioned      Rexene Edison, NP 12/14/2019

## 2019-12-14 NOTE — Assessment & Plan Note (Signed)
Chronic hypersensitivity pneumonitis with mild flare associated with acute bronchitis.  Chest x-ray does reflect some increased interstitial markings consistent with acute bronchitis.  No acute consolidation noted. Patient is clinically improving will continue prednisone at higher dose for the next 2 weeks and then decrease back to her baseline at 5 mg.  She has an upcoming PFT in 6 weeks.  If decline in lung function/DLCO consider repeat high-resolution CT chest for progression  Plan  Patient Instructions  Begin Oxygen 2l/m with activity  Order for POC .  Mucinex DM Twice daily  As needed  Cough/congestion  Increase prednisone 10mg  daily for 2 weeks then 5mg  daily  Activity as tolerated.  Continue on Breo daily, rinse after use Albuterol inhaler as needed Follow up in 2 months with Dr. Chase Caller and As needed   Please contact office for sooner follow up if symptoms do not improve or worsen or seek emergency care

## 2019-12-16 ENCOUNTER — Ambulatory Visit: Payer: Medicare Other | Attending: Internal Medicine

## 2019-12-16 DIAGNOSIS — Z23 Encounter for immunization: Secondary | ICD-10-CM

## 2019-12-16 NOTE — Progress Notes (Signed)
   Covid-19 Vaccination Clinic  Name:  Rhonda [name]    MRN: [id number] DOB: [birth year]  12/16/2019  [name] was observed post Covid-19 immunization for 15 minutes without incidence. She was provided with Vaccine Information Sheet and instruction to access the V-Safe system.   [name] was instructed to call 911 with any severe reactions post vaccine: Marland Kitchen Difficulty breathing  . Swelling of your face and throat  . A fast heartbeat  . A bad rash all over your body  . Dizziness and weakness    Immunizations Administered    Name Date Dose VIS Date Route   Pfizer COVID-19 Vaccine 12/16/2019  3:08 PM 0.3 mL 11/03/2019 Intramuscular   Manufacturer: [place]   Lot: [id number]   [place]: [id number]

## 2019-12-18 ENCOUNTER — Telehealth: Payer: Self-pay | Admitting: Internal Medicine

## 2019-12-18 NOTE — Telephone Encounter (Signed)
Let patient know that I reviwed her recent visit with Tammy Parrett and am a bit concerned about the recent flare up  Plan  - afte pft 01/29/2020 - pls schedule 30 min visit with me - any day though ILD day preferred  - we really need to discuss ofev - I am happy to start paper work for it now if she wants - she can call/text me direct anytime

## 2019-12-19 ENCOUNTER — Other Ambulatory Visit: Payer: Self-pay | Admitting: Internal Medicine

## 2019-12-19 NOTE — Telephone Encounter (Signed)
Noted. Nothing further needed. 

## 2019-12-19 NOTE — Telephone Encounter (Signed)
MR does have an ILD clinic day avail 3/9 that we can get pt scheduled for an appt with him after she has the PFT 3/8.  Attempted to call pt but unable to reach. Left message for pt to return call.

## 2019-12-19 NOTE — Telephone Encounter (Signed)
I have made this appt and pt is aware She also wanted you to know her oxygen she will get today

## 2019-12-22 ENCOUNTER — Other Ambulatory Visit: Payer: Self-pay | Admitting: Medical

## 2019-12-23 ENCOUNTER — Ambulatory Visit: Payer: Medicare Other

## 2020-01-08 ENCOUNTER — Ambulatory Visit: Payer: Medicare Other | Attending: Internal Medicine

## 2020-01-08 DIAGNOSIS — Z23 Encounter for immunization: Secondary | ICD-10-CM | POA: Insufficient documentation

## 2020-01-08 NOTE — Progress Notes (Signed)
   Covid-19 Vaccination Clinic  Name:  Rhonda [name]    MRN: [id number] DOB: [birth year]  01/08/2020  Ms. Dial was observed post Covid-19 immunization for 15 minutes without incidence. She was provided with Vaccine Information Sheet and instruction to access the V-Safe system.   [name] was instructed to call 911 with any severe reactions post vaccine: Marland Kitchen Difficulty breathing  . Swelling of your face and throat  . A fast heartbeat  . A bad rash all over your body  . Dizziness and weakness    Immunizations Administered    Name Date Dose VIS Date Route   Pfizer COVID-19 Vaccine 01/08/2020  9:21 AM 0.3 mL 11/03/2019 Intramuscular   Manufacturer: [place]   Lot: [id number]   [place]: [id number]

## 2020-01-15 MED ORDER — BREO ELLIPTA 100-25 MCG/INH IN AEPB: 1.0000 | INHALATION_SPRAY | Freq: Every day | RESPIRATORY_TRACT | 3 refills | Status: DC

## 2020-01-18 ENCOUNTER — Ambulatory Visit: Payer: Medicare Other

## 2020-01-25 ENCOUNTER — Other Ambulatory Visit (HOSPITAL_COMMUNITY)
Admission: RE | Admit: 2020-01-25 | Discharge: 2020-01-25 | Disposition: A | Discharge status: Home / Self Care | Payer: Medicare Other | Source: Ambulatory Visit | Attending: Internal Medicine | Admitting: Internal Medicine

## 2020-01-25 DIAGNOSIS — Z20822 Contact with and (suspected) exposure to covid-19: Secondary | ICD-10-CM | POA: Diagnosis not present

## 2020-01-25 DIAGNOSIS — Z01812 Encounter for preprocedural laboratory examination: Secondary | ICD-10-CM | POA: Insufficient documentation

## 2020-01-26 LAB — SARS CORONAVIRUS 2 (TAT 6-24 HRS): SARS Coronavirus 2: NEGATIVE

## 2020-01-27 DIAGNOSIS — N3001 Acute cystitis with hematuria: Secondary | ICD-10-CM | POA: Diagnosis not present

## 2020-01-29 ENCOUNTER — Other Ambulatory Visit: Payer: Self-pay

## 2020-01-29 ENCOUNTER — Other Ambulatory Visit: Payer: Self-pay | Admitting: Internal Medicine

## 2020-01-29 ENCOUNTER — Ambulatory Visit (INDEPENDENT_AMBULATORY_CARE_PROVIDER_SITE_OTHER): Payer: Medicare Other | Admitting: Internal Medicine

## 2020-01-29 DIAGNOSIS — J849 Interstitial pulmonary disease, unspecified: Secondary | ICD-10-CM

## 2020-01-29 LAB — PULMONARY FUNCTION TEST
DL/VA % pred: 81 %
DL/VA: 3.5 ml/min/mmHg/L
DLCO cor % pred: 57 %
DLCO cor: 9.36 ml/min/mmHg
DLCO unc % pred: 57 %
DLCO unc: 9.36 ml/min/mmHg
FEF 25-75 Pre: 2.11 L/sec
FEF2575-%Pred-Pre: 130 %
FEV1-%Pred-Pre: 69 %
FEV1-Pre: 1.26 L
FEV1FVC-%Pred-Pre: 119 %
FEV6-%Pred-Pre: 60 %
FEV6-Pre: 1.39 L
FEV6FVC-%Pred-Pre: 105 %
FVC-%Pred-Pre: 57 %
FVC-Pre: 1.39 L
Pre FEV1/FVC ratio: 91 %
Pre FEV6/FVC Ratio: 100 %

## 2020-01-29 NOTE — Progress Notes (Signed)
PFT done today. 

## 2020-01-30 ENCOUNTER — Ambulatory Visit (INDEPENDENT_AMBULATORY_CARE_PROVIDER_SITE_OTHER): Payer: Medicare Other | Admitting: Internal Medicine

## 2020-01-30 ENCOUNTER — Encounter: Payer: Self-pay | Admitting: Internal Medicine

## 2020-01-30 VITALS — BP 124/64 | HR 74 | Ht 60.0 in | Wt 146.0 lb

## 2020-01-30 DIAGNOSIS — Z889 Allergy status to unspecified drugs, medicaments and biological substances status: Secondary | ICD-10-CM

## 2020-01-30 DIAGNOSIS — J849 Interstitial pulmonary disease, unspecified: Secondary | ICD-10-CM

## 2020-01-30 DIAGNOSIS — J679 Hypersensitivity pneumonitis due to unspecified organic dust: Secondary | ICD-10-CM | POA: Diagnosis not present

## 2020-01-30 DIAGNOSIS — R0789 Other chest pain: Secondary | ICD-10-CM | POA: Diagnosis not present

## 2020-01-30 MED ORDER — PREDNISONE 10 MG PO TABS: ORAL_TABLET | ORAL | 0 refills | Status: DC

## 2020-01-30 NOTE — Progress Notes (Signed)
#GE reflux with small hiatal hernia  - on ppi   #History of rapid heart rate not otherwise specified  - Start her on Lopressor  2008/2009 by primary care physician. History appears to correlate with onset of respiratory issues  - refuses to dc this drug as of 2014 discussion   # Hypersensitivity Pneumonitis and ILD  - Potential etiologies - cockateil x 2 for 18 years through end 2012. 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 Denies mold but lives in house built in [place] with Rhonda [name] "weird baselment: and has humidifier  - first noted on CT chest 10/15/09 following trip to [place] (PE negative) but not described in 2003 CT chest report  - autoimmune panel: 10/23/11: Negative  -  Uderwennt bronch 11/19/11  - non diagnostic  - VATS Nov 2013 (done after initially refusing)- [place]. However, there is worrying trend of UIP pattern in the Upper lobes.            Oct/Nov 2012  March 2013 08/11/12  Nov/Dec 2013 Dx HP/UIP 01/02/2013  02/28/13 05/25/2013  12/06/2013  03/15/2014  06/29/2014  10'27/15 12/19/2014  02/26/15 01/13/2016  04/29/2016  10/23/2016  01/25/2017  06/17/2017 [facility] July 2018 01/11/2018  04/13/2018  08/16/2018   Symp/Signs Dyspnea x5y  New crackles  dimished dyspnea except at hill. Improved crackles.  Improved dyspnea except @ hill                 FVC  2.6 L    2.2 L/82%   2.1 L/80%   2.19 L/82%  2.2L/91% 2.1/81% 1.86/69% 1.9L/71% 1.86/75% 1.75L/65% 1.82/73% (dec 2015 offce spiro) 2.84L/72% 1.54/61% 1.78L/67%  1.69L/65%  1.61/62% 1.35/55% 1.65/67%  DLCO  10/50%    10/51%   9.6/51%     11.9/63% 11.7/62%  11.561%  5.5/31% 10.06/57% 11/04/63%  10.43/55% 8.56/50% 10.57/61% 10.16/57% 9.21/52%  Walk test 185 ft x 3 laps on RA  rest 92%. Pk exertion 90%. Pk HR 108 Rest 92%. Pk exertion 86% at 2.5 laps. Pk HR 102  pk exertion - 88% Rest 96%. Pk 95%. Pk HR is 75. Done at full dose  lopressor Rest 94%. Pk 91%. Rest HR 93 with pk HR 103/min. Done at half dose lopressor REst 96%. Pk pulse ox 92%. Rest HR 65. Pk HR 86 Did not desat. Normal Pk HR  Pulse ox 96%, droped to89% at 2nd lap but bounced up and ended at 91% 3rd lap Did not desat Did not desat, Pk HHR 93  ;llowest pulse ox 90% at 2nd lap but she held her breath, then 92% at 3rd lap 99% -> 91% at end of 3rd lap 94%, Dropped to 87% at 3rd lap, HR peak102/m 97%, dropped to 89% in 3rd lap - asymptomatic. HR 100/min No prob with 79md at [facility] 99% rest - -> dopped to 92% 3rd lap. Pkr HR 107/min 98% rest -> 91% 3rd lap with Pk HR 91/min   CT chest   unchanged          yes Ct mid dec possibly worse - unsure Ct - similar to April 2016 but worse since 2013   CT 06/24/17 - similar to June 2017     Tests/Bx Bronch 0- non diagnostic   VATS - HP with UIP in Upper lobe                   RX  Rx pred start 11/23/12 On  pred 47m per day and will reduce to 258mOn Pred 2023mer day On pred 39m59mr day On pred 5mg 75m day Rx pred 5mg d33my She reduced to pred 5mg x 66mt 1 month QOD due to GI concern side effects On pred 5mg qod58mt will increase to 5/d after this visit On pred 5mg dail61m On pred 5mg daily32mncrease pred to 39mg per d60meduce to pred 7.5 due to skin sissues  pred burs and reduce to 39mg per da1m                         OV 06/17/2017  Chief Complaint  Patient presents with   Follow-up    [name]. Pt denies significant cough, denies CP/tightness, f/c/s.     She is better. Feels she does not need o2. Has been to [facility] for transplant clinic; felt too early. Seems higher dose prednisone helping but then she is also bettter t his time of the year. In May 2018 I was at [facility] and curbsided national thought leaders - feel that we could explore silent active GERD as Rhonda [name] possibility of ILD getting wors with time.   OV 01/11/2018  Chief Complaint  Patient presents with   Follow-up    PFT done today.  Pt  states she has been coughing x2 weeks since she returned from Rhonda [name] trip to [place]. FroDelawareing, pt has had pain in ribs. Pt states she has mild SOB which is stable due to rehab.  Pt states that Dr. Richter is wDarron Doomto know if pt could possibly have eosinophilic esophagitis.   Went to [place]. RetDelawarend has been coughing x 2 weeks. Associated with this is some rib pain on right side. She feels she does not need o2. She feels she does not need pred/abx at this time. She also exposed to flu wit family   OV 02/08/2018  Chief Complaint  Patient presents with   Acute Visit    CXR done 02/07/18.  Pt states her throat is sore but not as bad as it was yesterday when in office and does have some left ear pain. Pt states she has some pain on right rib cage and has increased fatigue.     Ms. Rhonda preLesniewskiutely.  I last saw her one month ago gave her Tamiflu for prophylaxis after flu exposure.  At that point in time she was already reducing her chronic stable prednisone of 10 mg for interstitial lung disease/hypersensitivity pneumonitis.  She had tapered herself slowly 3 or 4 weeks ago to 5 mg.  She says she was doing fairly okay but in the last 2 or 3 weeks she has had increased fatigue.  She says she goes daily swimming and then feels energized but then an hour later starts having fatigue which is more than usual.  Also in the daytime she has random fatigue.  In terms of her respiratory symptoms these are stable and in fact slightly better in terms of shortness of breath and cough but she is having sore throat without any fever and some left-sided otalgia.  In addition she is having right-sided chest pains that are more than usual.  The chest pains have been reported before.  These are specifically at localized spots along the previous incision several years ago for interstitial lung disease.  They are tender as well  to touch.  This pain is worse.  In addition she is also being bothered by arthralgia  in her hand and left hip.  The left hip arthralgias old with Rhonda [name] hand arthralgias new.  She is worried about pneumothorax recurrence and so she had Rhonda [name] chest x-ray yesterday that shows no pneumothorax.  She has stable interstitial lung disease changes but in my personal visualization these interstitial lung disease changes are worse compared to several years ago.  We do know that she has slowly worsening interstitial lung disease.  In terms of Rhonda [name] walking desaturation test this shows stability since last visit.  She has seen GI for acid reflux and has pH probe study coming up  In talking to her she tells me she has been on metformin for over Rhonda [name] month or 2.  This is new and is meant for pre-diabetes.  Apparently her hemoglobin A1c is always below 6 but is above 5.5.  Walking desaturation test on 02/08/2018 185 feet x 3 laps on ROOM AIR:  did walk normal pace with forehead probe desaturate. Rest pulse ox was 99%, final pulse ox was 93%. HR response 72/min at rest to 93/min at peak exertion. Patient Rhonda Rhonda [name]  Did not Desaturate < 88% . Rhonda Rhonda [name] yes did  Desaturated </= 3% points. Rhonda Rhonda [name] yes did get tachyardic   Dg Chest 2 View  Result Date: 02/07/2018 CLINICAL DATA:  Chest wall pain for 2 weeks EXAM: CHEST - 2 VIEW COMPARISON:  CT 08/20/2017, radiograph 10/11/2015 FINDINGS: Coarse interstitial pattern consistent with pulmonary fibrosis, similar distribution compared to prior. No focal pulmonary opacity or pleural effusion. Stable cardiomediastinal silhouette with aortic atherosclerosis. No pneumothorax. Degenerative changes of the spine. IMPRESSION: No active cardiopulmonary disease. Similar appearance of diffuse pulmonary fibrosis. Negative for Rhonda [name] pneumothorax. Electronically Signed   By: Donavan Foil M.D.   On: 02/07/2018 14:26    OV 04/13/2018  Chief Complaint  Patient presents with   Follow-up    Pt had pre spiro and DLCO PFT prior to OV. Pt has increase of productive  cough-thick white in last week. Pt has some wheezing, and allergy issues.   Ms. Freda Munro presents for routine follow-up.  I saw her acutely just approximately 2 months ago.  Then end of April 2019 she saw Rhonda [name] nurse practitioner again acutely and was given antibiotic and prednisone.  She says she felt better after that but in the last week or so due to increased pollen load in the community and also moving furniture because her daughter is relocating out of her home she started having more cough.  There is no change in her dyspnea with the cough is really bad.  This is despite Singulair and Chlor-Trimeton.  Those things to help but just take the edge off.  She feels that the pollen making the cough worse.  There is no change in dyspnea.  Walking desaturation test documented below his baseline.  She had pulmonary function test today but the [facility] shows significant decline compared to February 2019 and she feels surprised by this.  DLCO is baseline unchanged and Rhonda [name] walking desaturation test limited in the office is also unchanged.  She does not have any fever but has significant postnasal drip.  She has not followed up at [facility] transplant clinic or the allergist recently.  OV 08/16/2018  Subjective:  Patient ID: Rhonda Rhonda [name], female , DOB: February 19, 1951 , age 77 y.o. , MRN: [id number] , ADDRESS: [address]  [place] [address]   08/16/2018 -   Chief Complaint  Patient presents with   Follow-up    PFT performed today.  Pt states she has had Rhonda [name] lot of mucus the month of September 2019 so she has been taking the Chlor-Trimeton. States other than that she has been able to do Rhonda [name] lot more activities and has been swimming Rhonda [name] lot more. Pt states she believes the SOB is stable.     HPI MKENZIE DOTTS 69 y.o. -continues to do well.  She is taking higher dose of prednisone 10 mg/day.  Also the summer usually she is doing well.  She is not having much of Rhonda [name] cough.  Lung function test shows improvement  compared to tests done earlier this year.  In fact she is almost as good as much 2018.  Still compared to 2014-2016 she has progressive lung disease.  Walking desaturation test is stable.  She needs Rhonda [name] high-dose flu shot but we do not have stock today.  She has lost some weight intentionally with better dietary control.  She is exercising regularly swimming.  We discussed ofev  potential future therapy if the drug is approved for this indication.  We will know study results in Rhonda [name] year or so.  She is open to the idea but is worried somewhat about her irritable bowel syndrome flaring up.  She is no longer following at the [facility] lung transplant clinic given stability lung function  Other issues: She always has right-sided chest wall pain to the site of lung biopsy.  The only way this is been resolved as by her limiting her use of bra .  Also her irritable bowel syndrome is under control but in case it flares up she wants Rhonda [name] refill of dicyclomine     OV 12/15/2018  Subjective:  Patient ID: Rhonda Rhonda [name], female , DOB: 1951-11-17 , age 45 y.o. , MRN: [id number] , AD[address]ESS: [address] [place] [address]   12/15/2018 -   Chief Complaint  Patient presents with   Follow-up    Pt states she has been doing better since last visit with TP but states she has been having pain in her rib cage x2 years but states it has become more intense.     HPI Rhonda Rhonda [name] 69 y.o. -returns for follow-up of her ILD due to hypersensitivity pneumonitis.  She did Rhonda [name] clinic visit also for research with the ILD-pro registry study.  Since I last saw her in September 2019 she had Rhonda [name] flareup and required higher doses of prednisone.  Since then she is returned to baseline of 5 mg prednisone and she feels good.  She is stable.  Walking desaturation test shows stability.  We have been discussing over the last few months about taking nintedanib based on new data and progressive non--IPF ILD's.  She has trepidation for  this because of irritable bowel syndrome.  After much discussion she is willing to try 100 mg once daily for Rhonda [name] month and then escalate to 100 mg twice daily which is the therapeutic dose.  She will do this once after insurance approval which we suspect will happen over the next few months.  Her main issue is that her right sided infra-axillary chest pain is getting worse.  This started after her surgical lung biopsy and pneumothorax on the right side.  It is random and happens with twisting.  But now days it happens once every few days.  Previously it used to happen  once every few weeks.  Sometimes it is excruciating but then she is left in pain for Rhonda [name] few days.  Applying Abrol sudden twisting of her body makes it worse.  Is Rhonda [name] lancinating pain.  She did try gabapentin some years ago for chronic cough .  She did tolerate the gabapentin well.  She does not remember if it helped the pain or not but clearly back and the pain was much less intense.  She is willing to try this again.  I recommended Rhonda [name] CT scan of the chest but she is going to have radiation for Rhonda [name] CT coronary angiogram because of high levels of coronary artery calcification.  Therefore written to the radiologist to see if he can look at the lung parenchyma with Rhonda [name] CT angiogram    OV 05/09/2019  Subjective:  Patient ID: Rhonda Rhonda [name], female , DOB: [birth year] , age 70 y.o. , MRN: [id number] , AD[address]ESS: [address] [place] [address]   05/09/2019 -   Chief Complaint  Patient presents with   ILD (Interstitial Lung Disease)     HPI Rhonda Rhonda [name] 69 y.o. -follow-up for chronic care physician pneumonitis on daily 5 mg prednisone.  She has problems with right-sided chest wall pain that is neuropathic.  She tells me since her last visit January 2020 she has been isolating and social distancing because of the pandemic.  Overall she is been stable.  Her symptom scores are mild and documented below.  She continues with prednisone.  She was  supposed to have pulmonary function test but this was held because of the pandemic.  Her walking desaturation test shows she is stable.  She is supposed to have pulmonary function test tomorrow but wanted to see me today.  In the interim she did have Rhonda [name] cardiac CT scan of the chest that shows 94th percentile of coronary calcium.  She could not get Rhonda [name] CT angiogram done because of PVC.  Rhonda [name] nuclear medicine stress test was done and I reviewed this result and it was normal.  Rhonda [name] Holter test is pending.  She is kind of nervous because of the ongoing COVID-19 situation and safety of doing Rhonda [name] Holter test.  In terms of her right chest wall pain she is adapted to it.  She does not use any bra  OV 08/22/2019  Subjective:  Patient ID: Rhonda Rhonda [name], female , DOB: 1951-08-21 , age 29 y.o. , MRN: [id number] , AD[address]ESS: [address] [place] [address]   08/22/2019 -   Chief Complaint  Patient presents with   Follow-up    needs sx clearance for L hip replacement- not yet scheduled, pending clearance.     Patient lung disease chronic hypersensitive pneumonitis on chronic daily prednisone 5 mg/day  HPI Rhonda Rhonda [name] 69 y.o. -presents for follow-up of her interstitial lung disease due to chronic hypersensitive pneumonitis.  After last visit in June 2020 given her PFT stability and also her aversion to the potential side effects with nintedanib she decided to just continue with prednisone daily 5 mg/day.  Given the pandemic she is walking 1 mile Rhonda [name] day without stopping.  Her interstitial lung disease symptom score is actually slightly better.  Overall she reports stability in her interstitial lung disease.  She has Rhonda [name] new issue of getting preoperative clearance for her left hip.  She says the left hip is bone-on-bone and she does not want to take Aleve.  She prefers to have surgery.  Dr. Paralee Cancel is  the surgeon.  Surgery date has not been set.  She is very functional.  She has normal renal function.  Normal  nutritional status.  No anemia.  No respiratory infections in the last 1 month.  She is not on oxygen.  Age 60.  The surgeries in the left hip and the duration of surgery is under 3 hours.  Anesthesia will be general.      OV 01/30/2020  Subjective:  Patient ID: Rhonda Rhonda [name], female , DOB: 06/14/1951 , age 22 y.o. , MRN: [id number] , AD[address]ESS: [address] [address] Patient lung disease chronic hypersensitive pneumonitis on chronic daily prednisone 5 mg/day  01/30/2020 -   Chief Complaint  Patient presents with   Follow-up    PFT performed 3/8.  Pt states she believes she has been going downhill for the past 2 months. Pt was started on O2 at last OV and wears it prn. Pt does have an occ cough.     HPI Rhonda Rhonda [name] 69 y.o. -presents for follow-up of interstitial lung disease secondary to chronic hypersensitive pneumonitis.  She maintains herself on prednisone 5 mg/day.  In the interim she has had hip surgery and this did wonders for her.  She is able to walk longer distances.  However she does notice that her dyspnea has declined.  She says since January 2021 his symptoms have declined particularly with cough.  Usually in the winter she goes through Rhonda [name] cycle of severe cough and exacerbation that requires higher levels of prednisone.  She has been social distancing well and masking well.  She states there is absolutely no mold or water any antigen exposure at home.  But she feels it is her allergies acting up and making her more symptomatic.  She has had Rhonda [name] Covid vaccine and is wondering about either going back to swimming or starting pulmonary rehabilitation.  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  In fact when we walked her today with the mask she did drop down to 89%.  This is Rhonda [name] decline for her.  She had pulmonary function test and this shows significant decline in FVC and DLCO.  In  review of this that.  Where she has declined this much but is always bounce back with steroids.  At last visit we discussed nintedanib for her as Rhonda [name] way to prevent progression of her ILD but given her concerns of GI side effects and the presence of irritable bowel syndrome she is declined.  Infective have this conversation with her including her joining the [employer] at [facility] but she has always been worried about the GI side effects with nintedanib.  This time she is more open to it because of the decline in lung function.  She wants to see an allergist  She also told me that she continues to have Rhonda [name] restrictive chest pain particularly on the right side lower area.  She no longer wears Rhonda [name] brassiere it has been nearly 3 years since she had Rhonda [name] high-resolution CT chest.  She is open to having one now.  Last liver function test was normal in January 2020.  Last renal function was normal in November 2020.  SYMPTOM SCALE - ILD Sept 2020 01/30/2020   O2 use  o2 with ex  Shortness of Breath  0 -> 5 scale with 5 being worst (score 6 If unable to do)  At rest 0 0  Simple tasks - showers, clothes change, eating, shaving 01 1  Household (dishes, doing bed, laundry) 0 3  Shopping 1 1  Walking level at own pace 1 2  Walking up Stairs 2 3  Total (30-36) Dyspnea Score 5 10  How bad is your cough? 1 3  How bad is your fatigue 1 2  How bad is nausea  0  How bad is vomiting?   0  How bad is diarrhea?  00  How bad is anxiety?  0  How bad is depression  0    Results for Rhonda Rhonda [name], Rhonda Rhonda [name] (MRN [id number]) as of 08/22/2019 11:56  Ref. Range 12/06/2013 13:03 03/15/2014 15:56 09/18/2014 11:41 02/26/2015 09:02 12/16/2015 16:31 04/29/2016 12:44 01/25/2017 10:16 01/11/2018 15:19 04/13/2018 08:47 08/16/2018 09:00 05/10/2019 09:42 3/8?21  FVC-Pre Latest Units: L 1.86 1.91 1.73 1.84 1.42  1.69 1.61 1.35 1.65 1.62 1.39  FVC-%Pred-Pre Latest Units: % '69 71 64 72 56 67 65 62 55 67 ' 67 57%   Results for Rhonda Rhonda [name], Rhonda Rhonda [name] (MRN [id number]) as  of 08/22/2019 11:56  Ref. Range 12/06/2013 13:03 03/15/2014 15:56 09/18/2014 11:41 02/26/2015 09:02 12/16/2015 16:31 04/29/2016 12:44 01/25/2017 10:16 01/11/2018 15:19 04/13/2018 08:47 08/16/2018 09:00 05/10/2019 09:42 01/29/20  DLCO unc Latest Units: ml/min/mmHg 11.99 11.70 11.53 5.52 10.06 12.12 10.43 10.57 10.16 9.21 10.32 9.36  DLCO unc % pred Latest Units: % '63 62 61 31 57 64 55 56 57 52 ' 63 57%    Simple office walk 185 feet x  3 laps goal with forehead probe 04/13/2018  08/16/2018  12/15/2018  05/09/2019  08/22/2019  01/30/2020   O2 used Room air Room air Room air Room air Room air Room air  Number laps completed '3 3 3 3 2 ' stopped at 2 die to hip pain 3 laps - no hip pain following hip surgery  Comments about pace good Moderate pace Normal, hip bothering     Resting Pulse Ox/HR 98% and 73/min 98% and HR 77/min 99% and HR 61/min 98% and HR 70/min 98% 98% and 75/min  Final Pulse Ox/HR 91% and 91/min 93% and 92.min 94% and 92/min 93% and 98/min 91%  89% and 93/,imn  Desaturated </= 88% no no no no    Desaturated <= 3% points yes yes Yes, 5 points Yes, 5 points    Got Tachycardic >/= 90/min yes yes yes yes    Symptoms at end of test none none Hip pain and very mild dyspnea  Stopped due to hip pain Moderate duyspnea with mask  Miscellaneous comments x x    No hip pain   No results for input(s): AST, ALT, ALKPHOS, BILITOT, PROT, ALBUMIN, INR in the last 168 hours.    ROS - per HPI     has Rhonda [name] past medical history of Allergy, Arthritis, Asthma, Cataract, Coronary artery calcification seen on CAT scan (08/19/2017), DDD (degenerative disc disease), thoracic, Depression, DOE (dyspnea on exertion), Fatty liver, GERD (gastroesophageal reflux disease), H/O steroid therapy, Heart palpitations (11/23/5518), Helicobacter pylori ab+, Hemorrhoids, Hiatal hernia, High cholesterol, History of chronic bronchitis, History of migraine, History of MRSA infection, Hyperlipidemia, mixed (08/19/2017), Hyperplastic colon polyp  (2007), IBS (irritable bowel syndrome), Inguinal hernia, Insulin resistance, Interstitial lung disease ([place]), MVP (mitral valve prolapse), Pneumonia, Pneumonitis, hypersensitivity ([place]), PONV (postoperative nausea and vomiting), Pre-diabetes, Pulmonary fibrosis ([employer]), PVC (premature ventricular contraction) (08/19/2017), Rapid heart rate, Squamous cell carcinoma of skin, and Tinnitus, right ear.   reports that she has never smoked. She has  never used smokeless tobacco.  Past Surgical History:  Procedure Laterality Date   1 HOUR [place] STUDY N/Rhonda [name] 02/21/2018   Procedure: 24 HOUR PH STUDY;  Surgeon: Mauri Pole, MD;  Location: [facility];  Service: Endoscopy;  Laterality: N/Rhonda [name];   BREAST BIOPSY Right 2009   BIOPSY, pt denies   BREAST BIOPSY Left 2003   Benign    CATARACT EXTRACTION Left    CESAREAN SECTION     COLONOSCOPY     ESOPHAGEAL MANOMETRY N/Rhonda [name] 02/21/2018   Procedure: ESOPHAGEAL MANOMETRY (EM);  Surgeon: Mauri Pole, MD;  Location: [facility];  Service: Endoscopy;  Laterality: N/Rhonda [name];   FOOT FRACTURE SURGERY  2006 or 2007   right   HYMENECTOMY     LUNG BIOPSY  09/28/2012   Procedure: LUNG BIOPSY;  Surgeon: Melrose Nakayama, MD;  Location: [facility];  Service: Thoracic;  Laterality: N/Rhonda [name];  lung biopsies tims three   SQUAMOUS CELL CARCINOMA EXCISION Left    left arm   TOTAL HIP ARTHROPLASTY Right 09/10/2015   Procedure: RIGHT TOTAL HIP ARTHROPLASTY ANTERIOR APPROACH;  Surgeon: Paralee Cancel, MD;  Location: [facility];  Service: Orthopedics;  Laterality: Right;   TOTAL HIP ARTHROPLASTY Left 10/03/2019   Procedure: TOTAL HIP ARTHROPLASTY ANTERIOR APPROACH;  Surgeon: Paralee Cancel, MD;  Location: [facility];  Service: Orthopedics;  Laterality: Left;  70 mins   TUBAL LIGATION     UPPER GI ENDOSCOPY     VIDEO ASSISTED THORACOSCOPY  09/28/2012   Procedure: VIDEO ASSISTED THORACOSCOPY;  Surgeon: Melrose Nakayama, MD;  Location: [facility];  Service: Thoracic;  Laterality: Right;    VIDEO BRONCHOSCOPY  11/19/2011   Procedure: VIDEO BRONCHOSCOPY WITH FLUORO;  Surgeon: Brand Males, MD;  Location: [facility];  Service: Endoscopy;;    Allergies  Allergen Reactions   Atorvastatin Other (See Comments)    Leg pain   Betadine [Povidone Iodine] Other (See Comments)    blisters   Codeine Nausea And Vomiting   Garlic Diarrhea   Hydrocodone Nausea And Vomiting   Onion Diarrhea   Rosuvastatin Other (See Comments)    Leg pain   Shellfish Allergy Nausea And Vomiting   Sulfa Antibiotics    Sulfonamide Derivatives Other (See Comments)    headaches    Immunization History  Administered Date(s) Administered   Fluad Quad(high Dose 65+) 07/17/2019   Hepatitis Rhonda [name] 05/23/2010   Hepatitis B 06/23/2006   Influenza Split 08/11/2012, 08/14/2017   Influenza Whole 09/08/2011   Influenza, High Dose Seasonal PF 08/24/2016, 07/24/2017, 08/31/2018   Influenza,inj,Quad PF,6+ Mos 09/07/2013   Influenza-Unspecified 09/26/2014, 07/27/2015   MMR 05/23/2010   PFIZER SARS-COV-2 Vaccination 12/16/2019, 01/08/2020   Pneumococcal Conjugate-13 03/15/2014   Pneumococcal Polysaccharide-23 01/11/2018   Td 02/22/2003   Tdap 10/28/2012   Zoster 12/04/2010   Zoster Recombinat (Shingrix) 02/15/2017, 05/17/2017    Family History  Problem Relation Age of Onset   Emphysema Maternal Grandmother    Asthma Maternal Grandmother    Asthma Mother    Osteoarthritis Mother    Dementia Mother 49   Lymphoma Father    Diabetes Father    Hypertension Sister    Heart disease Sister    Kidney disease Sister    Lung disease Maternal Grandfather    Bone cancer Paternal Grandfather    Allergic rhinitis Daughter    Breast cancer Paternal Aunt    Ovarian cancer Maternal Aunt    Breast cancer Cousin    Colon cancer Neg Hx    Angioedema Neg  Hx    Eczema Neg Hx    Immunodeficiency Neg Hx    Urticaria Neg Hx      Current Outpatient Medications:     acetaminophen (TYLENOL) 500 MG tablet, Tylenol Extra Strength, Disp: , Rfl:    albuterol (PROAIR HFA) 108 (90 Base) MCG/ACT inhaler, Inhale 2 puffs into the lungs every 4 (four) hours as needed for wheezing. Or coughing spells.  You may use 2 Puffs 5-10 minutes before exercise., Disp: 1 Inhaler, Rfl: 3   Alpha-D-Galactosidase (BEANO PO), Take 1 tablet by mouth as needed (if eating onion or garlic). , Disp: , Rfl:    Ascorbic Acid (VITAMIN C) 1000 MG tablet, Take 1,000 mg by mouth daily., Disp: , Rfl:    aspirin EC 81 MG tablet, Take 81 mg by mouth daily., Disp: , Rfl:    Calcium-Vitamin D-Vitamin K (VIACTIV) [phone] MG-UNT-MCG CHEW, Chew 2 tablets by mouth daily. , Disp: , Rfl:    chlorpheniramine (CHLOR-TRIMETON) 4 MG tablet, Take 2 mg by mouth 2 (two) times daily. Takes half tablet twice daily, Disp: , Rfl:    dicyclomine (BENTYL) 10 MG capsule, Take 1 capsule (10 mg total) by mouth 2 (two) times daily as needed for spasms., Disp: 60 capsule, Rfl: 1   ezetimibe (ZETIA) 10 MG tablet, TAKE 1 TABLET (10 MG TOTAL) BY MOUTH DAILY. PLEASE KEEP UPCOMING APPT FOR FUTURE REFILLS. THANK YOU., Disp: 90 tablet, Rfl: 3   fluconazole (DIFLUCAN) 100 MG tablet, , Disp: , Rfl:    fluticasone (FLONASE) 50 MCG/ACT nasal spray, SPRAY 2 SPRAYS INTO EACH NOSTRIL EVERY DAY, Disp: 48 mL, Rfl: 1   fluticasone furoate-vilanterol (BREO ELLIPTA) 100-25 MCG/INH AEPB, Inhale 1 puff into the lungs daily., Disp: 180 each, Rfl: 3   IRON-VITAMIN C PO, Take by mouth., Disp: , Rfl:    metFORMIN (GLUCOPHAGE-XR) 500 MG 24 hr tablet, Take 500 mg by mouth daily with supper., Disp: , Rfl:    metoprolol succinate (TOPROL-XL) 50 MG 24 hr tablet, Take 50 mg by mouth daily. , Disp: , Rfl:    montelukast (SINGULAIR) 10 MG tablet, TAKE 1 TABLET BY MOUTH EVERYDAY AT BEDTIME, Disp: 90 tablet, Rfl: 2   Multiple Vitamin (MULTIVITAMIN) tablet, Take 1 tablet by mouth daily., Disp: , Rfl:    nystatin-triamcinolone (MYCOLOG II)  cream, nystatin-triamcinolone 100,000 unit/g-0.1 % topical cream, Disp: , Rfl:    omeprazole (PRILOSEC) 20 MG capsule, Take 1 capsule (20 mg total) by mouth daily., Disp: 30 capsule, Rfl: 5   pravastatin (PRAVACHOL) 40 MG tablet, Take 40 mg by mouth every evening. , Disp: , Rfl:    predniSONE (DELTASONE) 10 MG tablet, TAKE 1 TABLET (10 MG TOTAL) BY MOUTH DAILY WITH BREAKFAST., Disp: 90 tablet, Rfl: 1   Probiotic Product (PROBIOTIC ADVANCED PO), Take 2 capsules by mouth daily. , Disp: , Rfl:    Turmeric 500 MG CAPS, Take 500 mg by mouth daily. , Disp: , Rfl:    UNABLE TO FIND, , Disp: , Rfl:    nitroGLYCERIN (NITROSTAT) 0.4 MG SL tablet, Place 1 tablet (0.4 mg total) under the tongue every 5 (five) minutes as needed for chest pain., Disp: 25 tablet, Rfl: 12   predniSONE (DELTASONE) 10 MG tablet, Take 4 tabs daily x1week, 3 tabs daily x1week, 2 tabs daily x1week, then continue on 1 tab daily, Disp: 80 tablet, Rfl: 0      Objective:   Vitals:   01/30/20 0909  BP: 124/64  Pulse: 74  SpO2: 95%  Weight: 146 lb (66.2 kg)  Height: 5' (1.524 m)    Estimated body mass index is 28.51 kg/m as calculated from the following:   Height as of this encounter: 5' (1.524 m).   Weight as of this encounter: 146 lb (66.2 kg).  '@WEIGHTCHANGE' @  [employer]   01/30/20 0909  Weight: 146 lb (66.2 kg)     Physical Exam  General Appearance:    Alert, cooperative, no distress, appears stated age - yes , Deconditioned looking - no , OBESE  - no, Sitting on Wheelchair -  no  Head:    Normocephalic, without obvious abnormality, atraumatic  Eyes:    PERRL, conjunctiva/corneas clear,  Ears:    Normal TM's and external ear canals, both ears  Nose:   Nares normal, septum midline, mucosa normal, no drainage    or sinus tenderness. OXYGEN ON  - no . Patient is @ ra   Throat:   Lips, mucosa, and tongue normal; teeth and gums normal. Cyanosis on lips - no  Neck:   Supple, symmetrical, trachea midline, no  adenopathy;    thyroid:  no enlargement/tenderness/nodules; no carotid   bruit or JVD  Back:     Symmetric, no curvature, ROM normal, no CVA tenderness  Lungs:     Distress - no , Wheeze no, Barrell Chest - no, Purse lip breathing - no, Crackles - yes bilaterally esp UL and LL   Chest Wall:    No tenderness or deformity.    Heart:    Regular rate and rhythm, S1 and S2 normal, no rub   or gallop, Murmur - no  Breast Exam:    NOT DONE  Abdomen:     Soft, non-tender, bowel sounds active all four quadrants,    no masses, no organomegaly. Visceral obesity - no  Genitalia:   NOT DONE  Rectal:   NOT DONE  Extremities:   Extremities - normal, Has Cane - no, Clubbing - no, Edema - no  Pulses:   2+ and symmetric all extremities  Skin:   Stigmata of Connective Tissue Disease - no  Lymph nodes:   Cervical, supraclavicular, and axillary nodes normal  Psychiatric:  Neurologic:   Pleasant - yesno, Anxious - no, Flat affect - no  CAm-ICU - neg, Alert and Oriented x 3 - yes, Moves all 4s - yes, Speech - normal, Cognition - intact           Assessment:       ICD-10-CM   1. ILD (interstitial lung disease) ([place])  J84.9 AMB referral to pulmonary rehabilitation    Ambulatory Referral for DME    Pulmonary function test    Pulse oximetry, overnight  2. 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 oil pain and ? mold in house  J67.9   3. History of seasonal allergies  Z88.9   4. Right-sided chest wall pain  R07.89   5. Interstitial pulmonary disease ([place])  J84.9 CT Chest High Resolution       Plan:     Patient Instructions     ICD-10-CM   1. ILD (interstitial lung disease) ([place])  J84.9   2. 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 oil pain and ? mold in house  J67.9   3. History of seasonal allergies  Z88.9   4. Right-sided chest wall pain  R07.89      I am concerned that the interstitial lung disease is worse versus you are going through Rhonda [name] flareup Chest wall  pain  remains unexplained but could be due to fibrosis  Plan  -Do repeat interstitial lung disease questionnaire to make sure there is no ongoing exposures  -You can drop this off anytime.  It takes 1 hour to do. -Increase prednisone to 40 mg once daily x1 week followed by 30 mg once daily x1 week and then 20 mg daily x1 week and then continue prednisone at 10 mg/day until further notice -Definitely visit with allergist -and consider allergy shots -Refer to pulmonary rehabilitation  -Okay to do swimming but only swim in the shallow end and make sure you have somebody nearby.  At this point in time I am not so sure that you could handle an oxygen drop in the swimming pool.  Therefore it has to be very careful with frequent breathing -Do overnight oxygen study on room air at rest -Continue oxygen with exertion through portable system  -Meet with [facility] to get Rhonda [name] referral for an extra battery -Start nintedanib 150 mg twice daily  -Given progression, we took Rhonda [name] shared decision making that there is Rhonda [name] good time point to start this to avoid progression of fibrosis  -We extensively discussed the side effects including possible but low bleeding risk [okay to continue aspirin, check with Amber on turmeric]  -Is Rhonda [name] new prescription.  First 1 week start 150 mg once daily and then increase to 150 mg twice daily  -Meet with our Careers information officer [place] in the next 1-2 weeks to get counseling and tips on managing nintedanib and irritable bowel syndrome  -Do high-resolution CT scan of the chest supine and prone in the next few weeks  Follow-up -Meet with [facility] pharmacist in the next 1-2 weeks -Spirometry and DLCO in the next 4-6 weeks ideally if possible if schedule allows -Return to see Dr. Chase Caller in Rhonda [name] 30-minute slot in the next 4-6 weeks [regardless of spirometry result]      Addendum: After she left -  we will check Rhonda [name] liver function test and renal function test   ( Level 05 visit: Estb 40-54 min   in   visit type: on-site physical face to visit  in total care time and counseling or/and coordination of care by this undersigned MD - Dr Brand Males. This includes one or more of the following on this same day 01/30/2020: pre-charting, chart review, note writing, documentation discussion of test results, diagnostic or treatment recommendations, prognosis, risks and benefits of management options, instructions, education, compliance or risk-factor reduction. It excludes time spent by the [place] or office staff in the care of the patient. Actual time 75 min)   SIGNATURE    Dr. Brand Males, M.D., F.C.C.P,  Pulmonary and Critical Care Medicine Staff Physician, [place] Director - Interstitial Lung Disease  Program  Pulmonary [place] [facility] [place], [place], [address]  Pager: [phone], If no answer or between  15:00h - 7:00h: call [phone] Telephone: [phone]  8:17 PM 01/30/2020

## 2020-01-30 NOTE — Patient Instructions (Addendum)
ICD-10-CM   1. ILD (interstitial lung disease) ([place])  J84.9   2. 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 oil pain and ? mold in house  J67.9   3. History of seasonal allergies  Z88.9   4. Right-sided chest wall pain  R07.89      I am concerned that the interstitial lung disease is worse versus you are going through a flareup Chest wall pain remains unexplained but could be due to fibrosis  Plan  -Do repeat interstitial lung disease questionnaire to make sure there is no ongoing exposures  -You can drop this off anytime.  It takes 1 hour to do. -Increase prednisone to 40 mg once daily x1 week followed by 30 mg once daily x1 week and then 20 mg daily x1 week and then continue prednisone at 10 mg/day until further notice -Definitely visit with allergist -and consider allergy shots -Refer to pulmonary rehabilitation  -Okay to do swimming but only swim in the shallow end and make sure you have somebody nearby.  At this point in time I am not so sure that you could handle an oxygen drop in the swimming pool.  Therefore it has to be very careful with frequent breathing -Do overnight oxygen study on room air at rest -Continue oxygen with exertion through portable system  -Meet with [facility] to get a referral for an extra battery -Start nintedanib 150 mg twice daily  -Given progression, we took a shared decision making that there is a good time point to start this to avoid progression of fibrosis  -We extensively discussed the side effects including possible but low bleeding risk [okay to continue aspirin, check with Amber on turmeric]  -Is a new prescription.  First 1 week start 150 mg once daily and then increase to 150 mg twice daily  -Meet with our Careers information officer [place] in the next 1-2 weeks to get counseling and tips on managing nintedanib and irritable bowel syndrome  -Do high-resolution CT scan of the chest supine and prone in the next few weeks  Follow-up -Meet with [facility]  pharmacist in the next 1-2 weeks -Spirometry and DLCO in the next 4-6 weeks ideally if possible if schedule allows -Return to see Dr. Chase Caller in a 30-minute slot in the next 4-6 weeks [regardless of spirometry result]

## 2020-01-31 ENCOUNTER — Encounter (HOSPITAL_COMMUNITY): Payer: Self-pay | Admitting: *Deleted

## 2020-01-31 ENCOUNTER — Telehealth: Payer: Self-pay | Admitting: Pharmacy Technician

## 2020-01-31 ENCOUNTER — Telehealth: Payer: Self-pay | Admitting: Internal Medicine

## 2020-01-31 DIAGNOSIS — J849 Interstitial pulmonary disease, unspecified: Secondary | ICD-10-CM

## 2020-01-31 NOTE — Telephone Encounter (Signed)
Received [employer] New start paperwork, will update as we work through the benefits process.  10:36 AM Rhonda [name], CPhT

## 2020-01-31 NOTE — Telephone Encounter (Signed)
Spoke with patient. She was calling to let us know that she had returned the Ofe[employer] forms back to the office today as well as the ILD questionaire packet. She was curious about the process so she called [employer] this morning to see if they could give her a quote on the price. They advised her that they could not give her a quote until they had actually received the RX. I advised her that based on her chart, our pharmacy team reviewed the forms today and they are working on it. She verbalized understanding.   While on the phone, she wanted me to let MR know that she is doing well on the prednisone taper. She has not used her O2 at all in the past 2 days. I advised her that I would let MR know.   Nothing further needed at time of call.

## 2020-01-31 NOTE — Telephone Encounter (Signed)
Submitted a Prior Authorization request to [facility] for [place] via Cover My Meds. Will update once we receive a response.  (KeyZI:[id number]) [id number]

## 2020-01-31 NOTE — Progress Notes (Signed)
Received referral from Dr. Chase Caller for this pt to participate in pulmonary rehab with the the diagnosis of ILD. Clinical review of pt follow up appt on 01/30/20 Pulmonary office note.  Pt with Covid Risk Score - 5. Pt appropriate for scheduling for Pulmonary rehab.  Will forward to support staff for verification of insurance eligibility/benefits and to pulmonary rehab staff for scheduling with pt consent. Cherre Huger, BSN Cardiac and Training and development officer

## 2020-02-01 NOTE — Telephone Encounter (Signed)
Received a fax regarding Prior Authorization from [employer] for [place]. Authorization has been DENIED because policy requests HRCT report revealing ILD diagnosis. Patient has upcoming CT scheduled for 02/07/20. Will fax to plan once completed.  Phone# [phone] Fax# [phone]  9:26 AM Beatriz Chancellor, CPhT

## 2020-02-02 ENCOUNTER — Telehealth (HOSPITAL_COMMUNITY): Payer: Self-pay

## 2020-02-02 NOTE — Telephone Encounter (Signed)
Patient indicated she is on macrobid (nitrafurantoin). Please find out when did she start? How liong the course? Who put her on it? I forgot to ask her during visit.   She needs to stop it  Let me know

## 2020-02-02 NOTE — Telephone Encounter (Signed)
lmtcb X1 for pt to clarify if she is on macrobid. Per pt's chart she is not currently nor has she ever been prescribed macrobid by this healthcare system.

## 2020-02-02 NOTE — Telephone Encounter (Signed)
Also she indicated she might have down jackets - feathered - please double chck on this

## 2020-02-02 NOTE — Progress Notes (Signed)
Subjective:  Patient presents today to [facility] to see pharmacy team for [facility]  Pertinent past medical history includes chronic fibrosing interstitial lung diseases (ILDs) with a progressive phenotype, pulmonary hypertension, asthma, allergic rhinitis, irritable bowel syndrome, GERD, osteoporosis, dyslipidemia, obesity and depression.  She is naive to anti-fibrotic therapy.  She is currently taking prednisone 5 mg daily.  Objective: Allergies  Allergen Reactions  . Atorvastatin Other (See Comments)    Leg pain  . Betadine [Povidone Iodine] Other (See Comments)    blisters  . Codeine Nausea And Vomiting  . Garlic Diarrhea  . Hydrocodone Nausea And Vomiting  . Onion Diarrhea  . Rosuvastatin Other (See Comments)    Leg pain  . Shellfish Allergy Nausea And Vomiting  . Sulfa Antibiotics   . Sulfonamide Derivatives Other (See Comments)    headaches    Outpatient Encounter Medications as of 02/06/2020  Medication Sig Note  . acetaminophen (TYLENOL) 500 MG tablet Tylenol Extra Strength   . albuterol (PROAIR HFA) 108 (90 Base) MCG/ACT inhaler Inhale 2 puffs into the lungs every 4 (four) hours as needed for wheezing. Or coughing spells.  You may use 2 Puffs 5-10 minutes before exercise.   . Alpha-D-Galactosidase (BEANO PO) Take 1 tablet by mouth as needed (if eating onion or garlic).    . Ascorbic Acid (VITAMIN C) 1000 MG tablet Take 1,000 mg by mouth daily.   Marland Kitchen aspirin EC 81 MG tablet Take 81 mg by mouth daily.   . Calcium-Vitamin D-Vitamin K (VIACTIV) 751-700-17 MG-UNT-MCG CHEW Chew 2 tablets by mouth daily.    . chlorpheniramine (CHLOR-TRIMETON) 4 MG tablet Take 2 mg by mouth 2 (two) times daily. Takes half tablet twice daily   . dicyclomine (BENTYL) 10 MG capsule Take 1 capsule (10 mg total) by mouth 2 (two) times daily as needed for spasms.   Marland Kitchen ezetimibe (ZETIA) 10 MG tablet TAKE 1 TABLET (10 MG TOTAL) BY MOUTH DAILY. PLEASE KEEP UPCOMING APPT FOR FUTURE REFILLS.  THANK YOU.   . fluconazole (DIFLUCAN) 100 MG tablet    . fluticasone (FLONASE) 50 MCG/ACT nasal spray SPRAY 2 SPRAYS INTO EACH NOSTRIL EVERY DAY   . fluticasone furoate-vilanterol (BREO ELLIPTA) 100-25 MCG/INH AEPB Inhale 1 puff into the lungs daily.   Marland Kitchen IRON-VITAMIN C PO Take by mouth.   . metFORMIN (GLUCOPHAGE-XR) 500 MG 24 hr tablet Take 500 mg by mouth daily with supper.   . metoprolol succinate (TOPROL-XL) 50 MG 24 hr tablet Take 50 mg by mouth daily.    . montelukast (SINGULAIR) 10 MG tablet TAKE 1 TABLET BY MOUTH EVERYDAY AT BEDTIME   . Multiple Vitamin (MULTIVITAMIN) tablet Take 1 tablet by mouth daily.   . nitroGLYCERIN (NITROSTAT) 0.4 MG SL tablet Place 1 tablet (0.4 mg total) under the tongue every 5 (five) minutes as needed for chest pain.   Marland Kitchen nystatin-triamcinolone (MYCOLOG II) cream nystatin-triamcinolone 100,000 unit/g-0.1 % topical cream   . omeprazole (PRILOSEC) 20 MG capsule Take 1 capsule (20 mg total) by mouth daily.   . pravastatin (PRAVACHOL) 40 MG tablet Take 40 mg by mouth every evening.    . predniSONE (DELTASONE) 10 MG tablet TAKE 1 TABLET (10 MG TOTAL) BY MOUTH DAILY WITH BREAKFAST.   Marland Kitchen predniSONE (DELTASONE) 10 MG tablet Take 4 tabs daily x1week, 3 tabs daily x1week, 2 tabs daily x1week, then continue on 1 tab daily   . Probiotic Product (PROBIOTIC ADVANCED PO) Take 2 capsules by mouth daily.    Marland Kitchen  Turmeric 500 MG CAPS Take 500 mg by mouth daily.    Marland Kitchen UNABLE TO FIND  01/30/2020: nitrofuranitol 172m   No facility-administered encounter medications on file as of 02/06/2020.     Immunization History  Administered Date(s) Administered  . Fluad Quad(high Dose 65+) 07/17/2019  . Hepatitis A 05/23/2010  . Hepatitis B 06/23/2006  . Influenza Split 08/11/2012, 08/14/2017  . Influenza Whole 09/08/2011  . Influenza, High Dose Seasonal PF 08/24/2016, 07/24/2017, 08/31/2018  . Influenza,inj,Quad PF,6+ Mos 09/07/2013  . Influenza-Unspecified 09/26/2014, 07/27/2015  . MMR  05/23/2010  . PFIZER SARS-COV-2 Vaccination 12/16/2019, 01/08/2020  . Pneumococcal Conjugate-13 03/15/2014  . Pneumococcal Polysaccharide-23 01/11/2018  . Td 02/22/2003  . Tdap 10/28/2012  . Zoster 12/04/2010  . Zoster Recombinat (Shingrix) 02/15/2017, 05/17/2017     HRCT 02/07/2020-pending  06/24/2017-demonstrate scattered areas of ground-glass attenuation, septal thickening, subpleural reticulation, thickening of the peribronchovascular interstitium, cylindrical traction bronchiectasis, peripheral traction bronchiolectasis and a few areas of very mild honeycombing. There is minimal craniocaudal gradient. Honeycombing is most pronounced in the anterior aspects of the upper lobes of the lungs bilaterally with relative sparing of the extreme lung bases. Inspiratory and expiratory imaging demonstrates extensive air trapping indicative of small airways disease. No acute consolidative airspace disease. No pleural effusions. No definite suspicious appearing pulmonary nodules or masses. Multiple suture lines in the right lung suggests prior excisional lung biopsy.  The appearance of the lungs is very similar to the prior study, with a pattern of fibrosis which remains most compatible with chronic hypersensitivity pneumonitis. No significant progression of disease is noted when compared to the prior examination.  04/27/2016- There is interstitial coarsening, pulmonary parenchymal ground-glass, bronchiectasis, subpleural reticulation and mild architectural distortion. Findings appear similar to 02/22/2015 but are progressive from 08/16/2012. Postoperative changes in the right middle lobe. There is air trapping. No pleural fluid. Airway is unremarkable.Pulmonary parenchymal pattern of fibrosis is in keeping with chronic hypersensitivity pneumonitis.  11/07/2015- -patchy areas of ground-glass attenuation, associated with septal thickening, profound thickening of the peribronchovascular interstitium with patchy  areas of cylindrical and mild varicose bronchiectasis. No honeycombing is identified. Findings do not have a clearly definable craniocaudal gradient. Inspiratory and expiratory imaging demonstrates extensive air trapping, indicative of small airways disease.   Overall, findings appear to be slightly progressive to prior study 02/22/2015. No acute consolidative airspace disease. No pleural effusions. No suspicious appearing pulmonary nodules or masses are noted.   PFT's TLC  Date Value Ref Range Status  04/29/2016 2.98 L Preliminary     Chest X-ray  CMP     Component Value Date/Time   NA 133 (L) 10/04/2019 0216   NA 142 12/08/2018 0917   K 4.1 10/04/2019 0216   CL 100 10/04/2019 0216   CO2 25 10/04/2019 0216   GLUCOSE 149 (H) 10/04/2019 0216   BUN 11 10/04/2019 0216   BUN 18 12/08/2018 0917   CREATININE 0.91 10/04/2019 0216   CREATININE 0.91 02/26/2015 1540   CALCIUM 8.3 (L) 10/04/2019 0216   PROT 6.5 12/08/2018 0917   ALBUMIN 3.9 12/08/2018 0917   AST 18 12/08/2018 0917   ALT 14 12/08/2018 0917   ALKPHOS 58 12/08/2018 0917   BILITOT 0.4 12/08/2018 0917   GFRNONAA >60 10/04/2019 0216   GFRNONAA 66 02/26/2015 1540   GFRAA >60 10/04/2019 0216   GFRAA 77 02/26/2015 1540     CBC    Component Value Date/Time   WBC 11.9 (H) 10/04/2019 0216   RBC 4.00 10/04/2019 0216   HGB  11.8 (L) 10/04/2019 0216   HCT 37.6 10/04/2019 0216   PLT 149 (L) 10/04/2019 0216   MCV 94.0 10/04/2019 0216   MCH 29.5 10/04/2019 0216   MCHC 31.4 10/04/2019 0216   RDW 13.5 10/04/2019 0216   LYMPHSABS 1.4 08/19/2011 1608   MONOABS 0.2 08/19/2011 1608   EOSABS 0.0 08/19/2011 1608   BASOSABS 0.0 08/19/2011 1608     LFT's Hepatic Function Latest Ref Rng & Units 12/08/2018 02/26/2015 10/12/2014  Total Protein 6.0 - 8.5 g/dL 6.5 6.8 -  Albumin 3.6 - 4.8 g/dL 3.9 3.9 -  AST 0 - 40 IU/L 18 23 -  ALT 0 - 32 IU/L '14 21 17  ' Alk Phosphatase 39 - 117 IU/L 58 43 -  Total Bilirubin 0.0 - 1.2 mg/dL 0.4 0.5 -       Assessment and Plan  1. Ofev Medication Management  Patient counseled on purpose, proper use, and potential adverse effects including diarrhea, nausea, vomiting, abdominal pain, decreased appetite, weight loss, and increased blood pressure. Stressed the importance of routine lab monitoring. Will monitor LFT's every month for the first 6 months of treatment then every 3 months. Will monitor CBC every 3 months.  Ofev dose will be 150 mg capsule every 12 hours with food. Stressed importance of taking with food to minimize stomach upset.  Submitted for approval of Ofev through insurance and was denied due to HRCT not demonstrating diagnosis of ILD.  Patient has a repeat HRCT on 02/07/2020 and will submit to insurance in hopes of getting Ofev approved.  We will update patient when we receive a response.  Patient has [place] for prescription coverage so she may be eligible to use a co-pay card.  2. Medication Reconciliation  A drug regimen assessment was performed, including review of allergies, interactions, disease-state management, dosing and immunization history. Medications were reviewed with the patient, including name, instructions, indication, goals of therapy, potential side effects, importance of adherence, and safe use.   No major drug interactions identified.  3. Immunizations  Patient is up-to-date with annual influenza, pneumonia, Shingrix and COVID-19 vaccines.  All questions encouraged and answered.  Instructed patient to reach out with any further questions or concerns.  Thank you for allowing pharmacy to participate in this patient's care.  This appointment required  80 minutes of patient care (this includes precharting, chart review, review of results, face-to-face care, etc.).  Mariella Saa, PharmD, [place], [place] Clinical Specialty Pharmacist [phone]  02/06/2020 4:41 PM

## 2020-02-02 NOTE — Telephone Encounter (Signed)
Order placed at pt's last OV for ONO to be done with [facility] as DME since pt is already established with them for daytime O2. Pt stated she received a second battery from them as that was one order that was placed at last OV but has not heard about the ONO.  PCCs, can you please look into this for [place]. Thanks!

## 2020-02-02 NOTE — Telephone Encounter (Signed)
Pt insurance is active and benefits verified through Medicare A/B. Co-pay $0.00, DED $203.00/$0.00 met, out of pocket $0.00/$0.00 met, co-insurance 20%. No pre-authorization required.    2ndary insurance is active and benefits verified through [employer].Marland Kitchen Co-pay $0.00, DED $1,500.00/$0.00 met, out of pocket $5,900.00/$194.73 met, co-insurance 30%. No pre-authorization required. Fred/[employer], 02/02/20 @ 405PM, [phone]  Will pass to [facility] for scheduling.

## 2020-02-05 ENCOUNTER — Telehealth (HOSPITAL_COMMUNITY): Payer: Self-pay | Admitting: *Deleted

## 2020-02-05 ENCOUNTER — Telehealth (HOSPITAL_COMMUNITY): Payer: Self-pay

## 2020-02-05 NOTE — Telephone Encounter (Signed)
Returned phone call, set up for walk test/orientation to pulmonary rehab 02/20/2020 @ 2:15 pm, she will be exercising in the 1300 time slot.

## 2020-02-06 ENCOUNTER — Ambulatory Visit (INDEPENDENT_AMBULATORY_CARE_PROVIDER_SITE_OTHER): Payer: Medicare Other | Admitting: Pharmacist

## 2020-02-06 ENCOUNTER — Other Ambulatory Visit: Payer: Self-pay

## 2020-02-06 DIAGNOSIS — J849 Interstitial pulmonary disease, unspecified: Secondary | ICD-10-CM

## 2020-02-06 DIAGNOSIS — Z7189 Other specified counseling: Secondary | ICD-10-CM

## 2020-02-06 LAB — COMPREHENSIVE METABOLIC PANEL
ALT: 15 U/L (ref 0–35)
AST: 18 U/L (ref 0–37)
Albumin: 3.7 g/dL (ref 3.5–5.2)
Alkaline Phosphatase: 65 U/L (ref 39–117)
BUN: 24 mg/dL — ABNORMAL HIGH (ref 6–23)
CO2: 26 mEq/L (ref 19–32)
Calcium: 9.7 mg/dL (ref 8.4–10.5)
Chloride: 101 mEq/L (ref 96–112)
Creatinine, Ser: 0.97 mg/dL (ref 0.40–1.20)
GFR: 56.62 mL/min — ABNORMAL LOW (ref >60.00)
Glucose, Bld: 166 mg/dL — ABNORMAL HIGH (ref 70–99)
Potassium: 4.4 mEq/L (ref 3.5–5.1)
Sodium: 135 mEq/L (ref 135–145)
Total Bilirubin: 0.3 mg/dL (ref 0.2–1.2)
Total Protein: 7.4 g/dL (ref 6.0–8.3)

## 2020-02-06 LAB — CBC WITH DIFFERENTIAL/PLATELET
Basophils Absolute: 0 10*3/uL (ref 0.0–0.1)
Basophils Relative: 0.2 % (ref 0.0–3.0)
Eosinophils Absolute: 0 10*3/uL (ref 0.0–0.7)
Eosinophils Relative: 0.1 % (ref 0.0–5.0)
HCT: 45.3 % (ref 36.0–46.0)
Hemoglobin: 14.8 g/dL (ref 12.0–15.0)
Lymphocytes Relative: 12.5 % (ref 12.0–46.0)
Lymphs Abs: 1.6 10*3/uL (ref 0.7–4.0)
MCHC: 32.7 g/dL (ref 30.0–36.0)
MCV: 88.7 fl (ref 78.0–100.0)
Monocytes Absolute: 0.2 10*3/uL (ref 0.1–1.0)
Monocytes Relative: 1.6 % — ABNORMAL LOW (ref 3.0–12.0)
Neutro Abs: 11.1 10*3/uL — ABNORMAL HIGH (ref 1.4–7.7)
Neutrophils Relative %: 85.6 % — ABNORMAL HIGH (ref 43.0–77.0)
Platelets: 250 10*3/uL (ref 150.0–400.0)
RBC: 5.11 Mil/uL (ref 3.87–5.11)
RDW: 14.5 % (ref 11.5–15.5)
WBC: 12.9 10*3/uL — ABNORMAL HIGH (ref 4.0–10.5)

## 2020-02-07 ENCOUNTER — Other Ambulatory Visit: Payer: Self-pay

## 2020-02-07 ENCOUNTER — Ambulatory Visit (HOSPITAL_COMMUNITY)
Admission: RE | Admit: 2020-02-07 | Discharge: 2020-02-07 | Disposition: A | Discharge status: Home / Self Care | Payer: Medicare Other | Source: Ambulatory Visit | Attending: Internal Medicine | Admitting: Internal Medicine

## 2020-02-07 DIAGNOSIS — J479 Bronchiectasis, uncomplicated: Secondary | ICD-10-CM | POA: Diagnosis not present

## 2020-02-07 DIAGNOSIS — J849 Interstitial pulmonary disease, unspecified: Secondary | ICD-10-CM

## 2020-02-09 DIAGNOSIS — D2310 Other benign neoplasm of skin of unspecified eyelid, including canthus: Secondary | ICD-10-CM | POA: Diagnosis not present

## 2020-02-09 NOTE — Telephone Encounter (Signed)
Please see specific report.  There is greater than 50% fibrosis.  There is also slight progression   CT Chest High Resolution  Addendum Date: 02/08/2020   ADDENDUM REPORT: 02/08/2020 16:13 ADDENDUM: The fibrotic changes in the lungs appear minimally increased compared to the prior study from 08/20/2017. Clinical inquiry as to the percentage of fibrosis was made, and the estimate is that approximately 50-60 percent of the lung parenchyma is fibrotic. Electronically Signed   By: Vinnie Langton M.D.   On: 02/08/2020 16:13   Result Date: 02/08/2020 CLINICAL DATA:  69 year old female with history of interstitial lung disease. EXAM: CT CHEST WITHOUT CONTRAST TECHNIQUE: Multidetector CT imaging of the chest was performed following the standard protocol without intravenous contrast. High resolution imaging of the lungs, as well as inspiratory and expiratory imaging, was performed. COMPARISON:  Cardiac CT 01/10/2019.  Chest CT 06/24/2017. FINDINGS: Cardiovascular: Heart size is normal. There is no significant pericardial fluid, thickening or pericardial calcification. There is aortic atherosclerosis, as well as atherosclerosis of the great vessels of the mediastinum and the coronary arteries, including calcified atherosclerotic plaque in the left main, left anterior descending, left circumflex and right coronary arteries. Mediastinum/Nodes: No pathologically enlarged mediastinal or hilar lymph nodes. Please note that accurate exclusion of hilar adenopathy is limited on noncontrast CT scans. Esophagus is unremarkable in appearance. No axillary lymphadenopathy. Lungs/Pleura: High-resolution images again demonstrate widespread but patchy areas of ground-glass attenuation, septal thickening, thickening of the peribronchovascular interstitium, mild cylindrical bronchiectasis and peripheral bronchiolectasis. No frank honeycombing confidently identified. No craniocaudal gradient. Postoperative changes of wedge resection in  the medial segment of the right middle lobe. Inspiratory and expiratory imaging demonstrates moderate air trapping, indicative of small airways disease. No acute consolidative airspace disease. No pleural effusions. No definite suspicious appearing pulmonary nodules or masses are noted. Upper Abdomen: Aortic atherosclerosis. Musculoskeletal: There are no aggressive appearing lytic or blastic lesions noted in the visualized portions of the skeleton. IMPRESSION: 1. The appearance of the lungs remains compatible with interstitial lung disease, with a spectrum of findings which is very similar to the prior study, most compatible with an alternative diagnosis to usual interstitial pneumonia (UIP) per current ATS guidelines, again strongly favored to reflect chronic hypersensitivity pneumonitis. 2. Aortic atherosclerosis, in addition to left main and 3 vessel coronary artery disease. Assessment for potential risk factor modification, dietary therapy or pharmacologic therapy may be warranted, if clinically indicated. Aortic Atherosclerosis (ICD10-I70.0). Electronically Signed: By: Vinnie Langton M.D. On: 02/07/2020 16:00

## 2020-02-12 ENCOUNTER — Encounter: Payer: Self-pay | Admitting: Pharmacist

## 2020-02-12 NOTE — Telephone Encounter (Signed)
Appeal faxed to [employer] [place] with updated HRCT information. Will update patient when we receive response.   [facility] Dept Phone number [phone] Fax number [phone]  [employer] of [facility]  Appeals Department / Level 1  Fax number: [phone]

## 2020-02-13 ENCOUNTER — Other Ambulatory Visit: Payer: Self-pay

## 2020-02-13 ENCOUNTER — Ambulatory Visit (INDEPENDENT_AMBULATORY_CARE_PROVIDER_SITE_OTHER): Payer: Medicare Other | Admitting: Pediatrics

## 2020-02-13 ENCOUNTER — Encounter: Payer: Self-pay | Admitting: Pediatrics

## 2020-02-13 ENCOUNTER — Telehealth: Payer: Self-pay | Admitting: Internal Medicine

## 2020-02-13 VITALS — BP 124/68 | HR 54 | Temp 97.4°F | Resp 20 | Ht 59.7 in | Wt 145.2 lb

## 2020-02-13 DIAGNOSIS — E119 Type 2 diabetes mellitus without complications: Secondary | ICD-10-CM | POA: Diagnosis not present

## 2020-02-13 DIAGNOSIS — I499 Cardiac arrhythmia, unspecified: Secondary | ICD-10-CM

## 2020-02-13 DIAGNOSIS — T7800XD Anaphylactic reaction due to unspecified food, subsequent encounter: Secondary | ICD-10-CM | POA: Diagnosis not present

## 2020-02-13 DIAGNOSIS — Z79899 Other long term (current) drug therapy: Secondary | ICD-10-CM | POA: Diagnosis not present

## 2020-02-13 DIAGNOSIS — J4540 Moderate persistent asthma, uncomplicated: Secondary | ICD-10-CM | POA: Diagnosis not present

## 2020-02-13 DIAGNOSIS — K219 Gastro-esophageal reflux disease without esophagitis: Secondary | ICD-10-CM | POA: Diagnosis not present

## 2020-02-13 DIAGNOSIS — J8410 Pulmonary fibrosis, unspecified: Secondary | ICD-10-CM | POA: Diagnosis not present

## 2020-02-13 DIAGNOSIS — J301 Allergic rhinitis due to pollen: Secondary | ICD-10-CM | POA: Diagnosis not present

## 2020-02-13 NOTE — Progress Notes (Signed)
[place] [id number] Dept: [phone]  FOLLOW UP NOTE  Patient ID: Rhonda [name], female    DOB: [birth year]  Age: 69 y.o. MRN: [id number] Date of Office Visit: 02/13/2020  Assessment  Chief Complaint: Asthma (discuss starting ITX.  Patient trying to get scheduled to start Ofev.  Patient wants to discuss food allergy hx.) and Allergy Testing  HPI Rhonda [name] presents for an allergy evaluation.  In the past she had an excellent response to allergy injections because of allergic rhinitis.  Her symptoms have gotten worse.  She has used fluticasone 2 sprays per nostril once a day and chlorpheniramine 4 mg up to 3 times a day.  Her symptoms have improved since she was started on prednisone 2 weeks ago .  Since 2012 she has required 5 to 10 mg of prednisone because of respiratory symptoms.  She had a hypersensitivity pneumonitis which was followed by pulmonary fibrosis and hopefully she will soon start on OFEV  for the pulmonary fibrosis .  She is on montelukast 10 mg once a day and Breo Ellipta 100 mg 1 puff every 24 hours.  If she uses ProAir she does have some improvement in the coughing and wheezing.   In the past she has had vomiting and diarrhea from garlic, onions and shellfish.  She avoids these foods  Other current medications are outlined in the chart     Drug Allergies:  Allergies  Allergen Reactions  . Atorvastatin Other (See Comments)    Leg pain  . Betadine [Povidone Iodine] Other (See Comments)    blisters  . Codeine Nausea And Vomiting  . Garlic Diarrhea  . Hydrocodone Nausea And Vomiting  . Onion Diarrhea  . Rosuvastatin Other (See Comments)    Leg pain  . Shellfish Allergy Nausea And Vomiting  . Sulfa Antibiotics   . Sulfonamide Derivatives Other (See Comments)    headaches    Physical Exam: BP 124/68 (BP Location: Left Arm, Patient Position: Sitting, Cuff Size: Normal)   Pulse (!) 54   Temp (!) 97.4 F (36.3 C) (Oral)   Resp 20    Ht 4' 11.7" (1.516 m)   Wt 145 lb 3.2 oz (65.9 kg)   SpO2 96%   BMI 28.64 kg/m    Physical Exam Vitals reviewed.  Constitutional:      Appearance: Normal appearance. She is normal weight.  HENT:     Head:     Comments: Eyes normal.  Ears normal.  Nose normal.  Pharynx normal. Cardiovascular:     Rate and Rhythm: Rhythm irregular.     Comments: S1-S2 normal no murmurs Pulmonary:     Comments: Clear to percussion and auscultation Abdominal:     Palpations: Abdomen is soft.     Tenderness: There is no abdominal tenderness.     Comments: No hepatosplenomegaly  Musculoskeletal:     Cervical back: Neck supple.  Lymphadenopathy:     Cervical: No cervical adenopathy.  Skin:    Comments: Clear  Neurological:     General: No focal deficit present.     Mental Status: She is alert and oriented to person, place, and time. Mental status is at baseline.  Psychiatric:        Mood and Affect: Mood normal.        Behavior: Behavior normal.        Thought Content: Thought content normal.        Judgment: Judgment normal.     Diagnostics:  FVC 1.57 L FEV1 1.26 L.  Predicted FVC 2.50 L predicted flow 1.89 L-there shows a moderate reduction in the forced vital capacity but no airway obstruction  Allergy skin test were positive to grass pollens, ragweed, some weeds, molds, dust mites, cat, horse, cockroach.  Mild reactivity to tree pollen.  She had positive skin test to crab and shrimp  Assessment and Plan: 1. Moderate persistent asthma without complication   2. Seasonal allergic rhinitis due to pollen   3. Anaphylactic reaction due to nonpoisonous foods, subsequent encounter   4. Pulmonary fibrosis ([place])   5. Irregular heartbeat   6. Current use of beta blocker   7. Gastroesophageal reflux disease without esophagitis   8. Allergic rhinitis due to pollen, unspecified seasonality     No orders of the defined types were placed in this encounter.   Patient Instructions   Environmental control of dust mite and mold Chlorpheniramine 4 mg - 1 tablet 3 times a day if needed for runny nose or itchy eyes Fluticasone 2 sprays per nostril once a day if needed for stuffy nose OpconA 1 drop 3 times a day if needed for itchy eyes It  would be best to start you on allergy injections in June after the spring allergy season but we would like for your heart rhythm to be regular in case of an allergic reaction   Montelukast 10 mg-take 1 tablet once a day to prevent coughing or wheezing Breo Ellipta 100-1 puff every 24 hours to prevent coughing or wheezing Proair 2 puffs every 4 hours if needed for wheezing or coughing spells.  You may use Proair 2 puffs 5 to 15 minutes before exercise Continue on your tapering doses of prednisone as prescribed by your pulmonologist. Let your family doctor and pulmonologist know that your heart rhythm is irregular.  You mentioned that at times in the past it has been irregular  Avoid shellfish.  If you have an allergic reaction take Benadryl 50 mg every 6 hours and if you have life-threatening symptoms inject with EpiPen 0.3 mg  Continue on your other medications Call us if you are not doing well on this treatment plan   Return in about 6 weeks (around 03/26/2020).    Thank you for the opportunity to care for this patient.  Please do not hesitate to contact me with questions.  Penne Lash, M.D.  [facility] [address] [place], [place] [address] [phone]

## 2020-02-13 NOTE — Patient Instructions (Addendum)
Environmental control of dust mite and mold Chlorpheniramine 4 mg - 1 tablet 3 times a day if needed for runny nose or itchy eyes Fluticasone 2 sprays per nostril once a day if needed for stuffy nose OpconA 1 drop 3 times a day if needed for itchy eyes It  would be best to start you on allergy injections in June after the spring allergy season but we would like for your heart rhythm to be regular in case of an allergic reaction   Montelukast 10 mg-take 1 tablet once a day to prevent coughing or wheezing Breo Ellipta 100-1 puff every 24 hours to prevent coughing or wheezing Proair 2 puffs every 4 hours if needed for wheezing or coughing spells.  You may use Proair 2 puffs 5 to 15 minutes before exercise Continue on your tapering doses of prednisone as prescribed by your pulmonologist. Let your family doctor and pulmonologist know that your heart rhythm is irregular.  You mentioned that at times in the past it has been irregular  Avoid shellfish.  If you have an allergic reaction take Benadryl 50 mg every 6 hours and if you have life-threatening symptoms inject with EpiPen 0.3 mg  Continue on your other medications Call us if you are not doing well on this treatment plan

## 2020-02-14 DIAGNOSIS — L249 Irritant contact dermatitis, unspecified cause: Secondary | ICD-10-CM | POA: Diagnosis not present

## 2020-02-14 DIAGNOSIS — E785 Hyperlipidemia, unspecified: Secondary | ICD-10-CM | POA: Diagnosis not present

## 2020-02-14 DIAGNOSIS — J8410 Pulmonary fibrosis, unspecified: Secondary | ICD-10-CM | POA: Diagnosis not present

## 2020-02-14 DIAGNOSIS — R7301 Impaired fasting glucose: Secondary | ICD-10-CM | POA: Diagnosis not present

## 2020-02-14 NOTE — Telephone Encounter (Signed)
MR - please advise on results when you are back in the office. Thanks.

## 2020-02-15 ENCOUNTER — Telehealth: Payer: Self-pay | Admitting: Pharmacist

## 2020-02-15 DIAGNOSIS — I493 Ventricular premature depolarization: Secondary | ICD-10-CM

## 2020-02-15 NOTE — Telephone Encounter (Signed)
Called pt and there was no answer-LMTCB °

## 2020-02-15 NOTE — Telephone Encounter (Signed)
Pt returning call.  [phone].

## 2020-02-15 NOTE — Telephone Encounter (Signed)
Left message for patient to call back  

## 2020-02-15 NOTE — Telephone Encounter (Signed)
Patient is returning phone call. Patient phone number is [phone].

## 2020-02-15 NOTE — Telephone Encounter (Signed)
Patient called to inquire about Questran's.  She spoke with her primary care provider about starting an antifibrotic medication and concerns about the side effect of diarrhea.  She already has IBS and is prone to diarrhea.  At last office visit discussed the use of over-the-counter Imodium for diarrhea.  Her PCP recommended Questran due to in her experience it does not lead to constipation like Imodium does.  Advised that Questran as indicated for high cholesterol but can help with diarrhea.  She is concerned about how it could be incorporated into her current medication regimen as she knows it can affect the absorption of oral medications.  Advised that I will look at her medication list and removed for any potential interactions.  Patient verbalized understanding.  Updated patient on status of Ofev.  Recent and appeal on Monday and are still waiting on a response from insurance.  Advised that once we we will update once we receive a response.  Patient verbalized understanding.  Mariella Saa, PharmD, [place], [place] Clinical Specialty Pharmacist [phone]  02/15/2020 4:43 PM

## 2020-02-15 NOTE — Telephone Encounter (Signed)
ono done 02/06/20 - shows pulse ox < 88% for 0.4 min  -> so does NOT qualify for night o2  Noted that mention that the lowest HR was 26/min. Unclear for how long -> she probbly needs to discuss his with PCP No primary care provider on file. or cards. Not sure if real or artifact

## 2020-02-15 NOTE — Telephone Encounter (Signed)
I called and spoke with the pt and notified her of the results and she verbalized understanding.

## 2020-02-19 ENCOUNTER — Encounter: Payer: Self-pay | Admitting: *Deleted

## 2020-02-19 MED ORDER — OFEV 150 MG PO CAPS: 150.0000 mg | ORAL_CAPSULE | Freq: Two times a day (BID) | ORAL | 2 refills | Status: DC

## 2020-02-19 NOTE — Addendum Note (Signed)
Addended by: Mariella Saa C on: 02/19/2020 03:22 PM   Modules accepted: Orders

## 2020-02-19 NOTE — Addendum Note (Signed)
Addended by: Mariella Saa C on: 02/19/2020 03:19 PM   Modules accepted: Orders

## 2020-02-19 NOTE — Telephone Encounter (Addendum)
Called to notify patient of approval.  Informed she must use [facility] and is eligible to use a co-pay card.  Patient verbalized understanding.  Patient was given the number to [facility] to call tomorrow to set up her for shipment.  Patient asked if she would be able to take 1 tablet daily due to IBS.  Patient states she discussed that with Dr. Chase Caller at last visit.  Advised Dr. Chase Caller did not discuss her dosing schedule with me but if they discussed at a prior appointment she can start with 1 tablet daily for 1 to 2 weeks and titrate up to 1 tablet twice a day as tolerated.  Patient verbalized understanding.  Patient previously inquired about the use of Questran for diarrhea which was recommended by her PCP.  Reviewed patient's chart and advised in order for the medication not to interact with other prescription she would need to take 1 hour prior to her morning meds.  Patient verbalized understanding.  Patient states she will reach out to her PCP for prescription.  Patient has follow-up appointment with Dr. Chase Caller April 27.  She will be due for follow-up labs.  Future orders placed.  All questions encouraged and answered.  Instructed patient to call if she has any issues obtaining medication or other questions and concerns.  Mariella Saa, PharmD, [place], [place] [facility] Pharmacist [phone]  02/19/2020 3:19 PM

## 2020-02-19 NOTE — Telephone Encounter (Signed)
Called [facility] to check status of appeal, Received notification from [facility] regarding a prior authorization for [place]. Authorization has been APPROVED from 02/14/20 to 02/13/21.   Per plan, patient must use [facility]. Patient has [facility] and will be able to use a copay card.  Phone # [phone]  10:07 AM Beatriz Chancellor, CPhT

## 2020-02-19 NOTE — Progress Notes (Signed)
Patient ID: Rhonda [name], female   DOB: Jan 18, 1951, 69 y.o.   MRN: [id number] Patient enrolled for [employer] to mail a 3 day ZIO XT long term holter monitor to her home.  Instructions sent to patient via My Chart message and will also be included in her monitor kit.

## 2020-02-20 ENCOUNTER — Encounter (HOSPITAL_COMMUNITY)
Admission: RE | Admit: 2020-02-20 | Discharge: 2020-02-20 | Disposition: A | Discharge status: Home / Self Care | Payer: Medicare Other | Source: Ambulatory Visit | Attending: Internal Medicine | Admitting: Internal Medicine

## 2020-02-20 ENCOUNTER — Other Ambulatory Visit: Payer: Self-pay

## 2020-02-20 VITALS — BP 114/66 | HR 74 | Temp 97.2°F | Ht 60.0 in | Wt 146.6 lb

## 2020-02-20 DIAGNOSIS — J849 Interstitial pulmonary disease, unspecified: Secondary | ICD-10-CM | POA: Diagnosis not present

## 2020-02-20 NOTE — Progress Notes (Signed)
Pulmonary Individual Treatment Plan  Patient Details  Name: Rhonda [name] MRN: [id number] Date of Birth: April 23, 1951 Referring Provider:     Pulmonary Rehab Walk Test from 02/20/2020 in [place]  Referring Provider  Dr. Chase Caller      Initial Encounter Date:    Pulmonary Rehab Walk Test from 02/20/2020 in [place]  Date  02/20/20      Visit Diagnosis: Interstitial lung disease ([place])  Patient's Home Medications on Admission:   Current Outpatient Medications:  .  acetaminophen (TYLENOL) 500 MG tablet, Tylenol Extra Strength, Disp: , Rfl:  .  albuterol (PROAIR HFA) 108 (90 Base) MCG/ACT inhaler, Inhale 2 puffs into the lungs every 4 (four) hours as needed for wheezing. Or coughing spells.  You may use 2 Puffs 5-10 minutes before exercise., Disp: 1 Inhaler, Rfl: 3 .  Alpha-D-Galactosidase (BEANO PO), Take 1 tablet by mouth as needed (if eating onion or garlic). , Disp: , Rfl:  .  Ascorbic Acid (VITAMIN C) 1000 MG tablet, Take 1,000 mg by mouth daily., Disp: , Rfl:  .  aspirin EC 81 MG tablet, Take 81 mg by mouth daily., Disp: , Rfl:  .  Calcium-Vitamin D-Vitamin K (VIACTIV) [id number] MG-UNT-MCG CHEW, Chew 2 tablets by mouth daily. , Disp: , Rfl:  .  chlorpheniramine (CHLOR-TRIMETON) 4 MG tablet, Take 2 mg by mouth 2 (two) times daily. Takes half tablet twice daily, Disp: , Rfl:  .  dicyclomine (BENTYL) 10 MG capsule, Take 1 capsule (10 mg total) by mouth 2 (two) times daily as needed for spasms., Disp: 60 capsule, Rfl: 1 .  EPINEPHrine 0.3 mg/0.3 mL IJ SOAJ injection, Inject into the muscle., Disp: , Rfl:  .  ezetimibe (ZETIA) 10 MG tablet, TAKE 1 TABLET (10 MG TOTAL) BY MOUTH DAILY. PLEASE KEEP UPCOMING APPT FOR FUTURE REFILLS. THANK YOU., Disp: 90 tablet, Rfl: 3 .  fluticasone (FLONASE) 50 MCG/ACT nasal spray, SPRAY 2 SPRAYS INTO EACH NOSTRIL EVERY DAY, Disp: 48 mL, Rfl: 1 .  fluticasone furoate-vilanterol (BREO  ELLIPTA) 100-25 MCG/INH AEPB, Inhale 1 puff into the lungs daily., Disp: 180 each, Rfl: 3 .  IRON-VITAMIN C PO, Take by mouth., Disp: , Rfl:  .  lactase (LACTAID) 3000 units tablet, Take by mouth., Disp: , Rfl:  .  metFORMIN (GLUCOPHAGE-XR) 500 MG 24 hr tablet, Take 500 mg by mouth daily with supper., Disp: , Rfl:  .  metoprolol succinate (TOPROL-XL) 50 MG 24 hr tablet, Take 50 mg by mouth daily. , Disp: , Rfl:  .  montelukast (SINGULAIR) 10 MG tablet, TAKE 1 TABLET BY MOUTH EVERYDAY AT BEDTIME, Disp: 90 tablet, Rfl: 2 .  Multiple Vitamin (MULTIVITAMIN) tablet, Take 1 tablet by mouth daily., Disp: , Rfl:  .  nystatin-triamcinolone (MYCOLOG II) cream, nystatin-triamcinolone 100,000 unit/g-0.1 % topical cream, Disp: , Rfl:  .  omeprazole (PRILOSEC) 20 MG capsule, Take 1 capsule (20 mg total) by mouth daily., Disp: 30 capsule, Rfl: 5 .  pravastatin (PRAVACHOL) 40 MG tablet, Take 40 mg by mouth every evening. , Disp: , Rfl:  .  predniSONE (DELTASONE) 10 MG tablet, TAKE 1 TABLET (10 MG TOTAL) BY MOUTH DAILY WITH BREAKFAST., Disp: 90 tablet, Rfl: 1 .  Probiotic Product (PROBIOTIC ADVANCED PO), Take 2 capsules by mouth daily. , Disp: , Rfl:  .  Turmeric 500 MG CAPS, Take 500 mg by mouth daily. , Disp: , Rfl:  .  Nintedanib (OFEV) 150 MG CAPS, Take 1 capsule (150  mg total) by mouth 2 (two) times daily., Disp: 60 capsule, Rfl: 2 .  nitrofurantoin, macrocrystal-monohydrate, (MACROBID) 100 MG capsule, Take 100 mg by mouth 2 (two) times daily., Disp: , Rfl:  .  nitroGLYCERIN (NITROSTAT) 0.4 MG SL tablet, Place 1 tablet (0.4 mg total) under the tongue every 5 (five) minutes as needed for chest pain., Disp: 25 tablet, Rfl: 12 .  predniSONE (DELTASONE) 10 MG tablet, Take 4 tabs daily x1week, 3 tabs daily x1week, 2 tabs daily x1week, then continue on 1 tab daily (Patient not taking: Reported on 02/20/2020), Disp: 80 tablet, Rfl: 0  Past Medical History: Past Medical History:  Diagnosis Date  . Allergy   .  Arthritis    history spinal stenosis. osteoarthritis right hip  . Asthma   . Cataract   . Coronary artery calcification seen on CAT scan 08/19/2017   >300 on CT scan 08/2017  . DDD (degenerative disc disease), thoracic   . Depression   . DOE (dyspnea on exertion)    a. 04/2010 Lexi MV EF 71%, no ischemia/infarct;    . Fatty liver   . GERD (gastroesophageal reflux disease)   . H/O steroid therapy    Steroid use orally over 4 yrs- for Lung Fibrosis  . Heart palpitations 02/28/2015  . Helicobacter pylori ab+   . Hemorrhoids   . Hiatal hernia   . High cholesterol   . History of chronic bronchitis    as child  . History of migraine   . History of MRSA infection   . Hyperlipidemia, mixed 08/19/2017  . Hyperplastic colon polyp 2007  . IBS (irritable bowel syndrome)   . Inguinal hernia    right  . Insulin resistance    past  . Interstitial lung disease ([place])   . MVP (mitral valve prolapse)    Posterior mitral valve leaflet with mild MR  . Pneumonia   . Pneumonitis, hypersensitivity ([place])    a. 09/2012 s/p Bx - ? 2/2 bird, mold, oil paint exposure ->on steroids, followed by pulm.  Marland Kitchen PONV (postoperative nausea and vomiting)   . Pre-diabetes    takes Metformin  . Pulmonary fibrosis ([facility])    Dr. Chase Caller follows- stable at present  . PVC (premature ventricular contraction) 08/19/2017  . Rapid heart rate    Dr. Radford Pax follows- last visit Epic note 9'16  . Squamous cell carcinoma of skin   . Tinnitus, right ear     Tobacco Use: Social History   Tobacco Use  Smoking Status Never Smoker  Smokeless Tobacco Never Used  Tobacco Comment   pt states she experimented in college    Labs: Recent Review [facility] for [facility] Cardiac and Pulmonary Rehab Latest Ref Rng & Units 02/26/2015 08/21/2015 11/08/2017 12/08/2018 09/28/2019   Cholestrol 100 - 199 mg/dL 184 - 180 177 -   LDLCALC 0 - 99 mg/dL 94 - 77 83 -   HDL >39 mg/dL 53 - 71 68 -   Trlycerides 0 - 149 mg/dL 185(H) -  158(H) 131 -   Hemoglobin A1c 4.8 - 5.6 % - 6.1(H) - 6.0(H) 6.2(H)   PHART 7.350 - 7.450 - - - - -   PCO2ART 35.0 - 45.0 mmHg - - - - -   HCO3 20.0 - 24.0 mEq/L - - - - -   TCO2 0 - 100 mmol/L - - - - -   ACIDBASEDEF 0.0 - 2.0 mmol/L - - - - -   O2SAT % - - - - -  Capillary Blood Glucose: Lab Results  Component Value Date   GLUCAP 209 (H) 10/04/2019   GLUCAP 150 (H) 10/04/2019   GLUCAP 139 (H) 10/03/2019   GLUCAP 112 (H) 10/03/2019   GLUCAP 93 10/03/2019     Pulmonary Assessment Scores: Pulmonary Assessment Scores    Row Name 02/20/20 1510 02/20/20 1531       ADL UCSD   ADL Phase  Entry  (Pended)   Entry      CAT Score   CAT Score  17  (Pended)   --      mMRC Score   mMRC Score  --  2      UCSD: Self-administered rating of dyspnea associated with activities of daily living (ADLs) 6-point scale (0 = "not at all" to 5 = "maximal or unable to do because of breathlessness")  Scoring Scores range from 0 to 120.  Minimally important difference is 5 units  CAT: CAT can identify the health impairment of COPD patients and is better correlated with disease progression.  CAT has a scoring range of zero to 40. The CAT score is classified into four groups of low (less than 10), medium (10 - 20), high (21-30) and very high (31-40) based on the impact level of disease on health status. A CAT score over 10 suggests significant symptoms.  A worsening CAT score could be explained by an exacerbation, poor medication adherence, poor inhaler technique, or progression of COPD or comorbid conditions.  CAT MCID is 2 points  mMRC: mMRC (Modified Medical Research Council) Dyspnea Scale is used to assess the degree of baseline functional disability in patients of respiratory disease due to dyspnea. No minimal important difference is established. A decrease in score of 1 point or greater is considered a positive change.   Pulmonary Function Assessment:   Exercise Target Goals: Exercise  Program Goal: Individual exercise prescription set using results from initial 6 min walk test and THRR while considering  patient's activity barriers and safety.   Exercise Prescription Goal: Initial exercise prescription builds to 30-45 minutes a day of aerobic activity, 2-3 days per week.  Home exercise guidelines will be given to patient during program as part of exercise prescription that the participant will acknowledge.  Activity Barriers & Risk Stratification: Activity Barriers & Cardiac Risk Stratification - 02/20/20 1447      Activity Barriers & Cardiac Risk Stratification   Activity Barriers  Left Hip Replacement;Right Hip Replacement;Deconditioning       6 Minute Walk: 6 Minute Walk    Row Name 02/20/20 1532         6 Minute Walk   Distance  1257 feet     Walk Time  6 minutes     # of Rest Breaks  0     MPH  2.38     METS  2.51     RPE  12     Perceived Dyspnea   2     VO2 Peak  8.79     Symptoms  Yes (comment)     Comments  L hip pain 1/10     Resting HR  75 bpm     Resting BP  114/66     Resting Oxygen Saturation   100 %     Exercise Oxygen Saturation  during 6 min walk  93 %     Max Ex. HR  94 bpm     Max Ex. BP  122/74     2 Minute Post BP  108/70       Interval HR   1 Minute HR  85     2 Minute HR  92     3 Minute HR  94     4 Minute HR  92     5 Minute HR  90     6 Minute HR  82     2 Minute Post HR  71     Interval Heart Rate?  Yes       Interval Oxygen   Interval Oxygen?  Yes     Baseline Oxygen Saturation %  100 %     1 Minute Oxygen Saturation %  99 %     1 Minute Liters of Oxygen  0 L     2 Minute Oxygen Saturation %  94 %     2 Minute Liters of Oxygen  0 L     3 Minute Oxygen Saturation %  93 %     3 Minute Liters of Oxygen  0 L     4 Minute Oxygen Saturation %  95 %     4 Minute Liters of Oxygen  0 L     5 Minute Oxygen Saturation %  94 %     5 Minute Liters of Oxygen  0 L     6 Minute Oxygen Saturation %  94 %     6 Minute Liters  of Oxygen  0 L     2 Minute Post Oxygen Saturation %  98 %     2 Minute Post Liters of Oxygen  0 L        Oxygen Initial Assessment: Oxygen Initial Assessment - 02/20/20 1530      Home Oxygen   Home Oxygen Device  Portable Concentrator    Sleep Oxygen Prescription  None    Home Exercise Oxygen Prescription  None    Home at Rest Exercise Oxygen Prescription  None    Compliance with Home Oxygen Use  Yes      Initial 6 min Walk   Oxygen Used  None      Program Oxygen Prescription   Program Oxygen Prescription  None      Intervention   Short Term Goals  To learn and exhibit compliance with exercise, home and travel O2 prescription;To learn and understand importance of monitoring SPO2 with pulse oximeter and demonstrate accurate use of the pulse oximeter.;To learn and understand importance of maintaining oxygen saturations>88%;To learn and demonstrate proper pursed lip breathing techniques or other breathing techniques.;To learn and demonstrate proper use of respiratory medications    Long  Term Goals  Exhibits compliance with exercise, home and travel O2 prescription;Verbalizes importance of monitoring SPO2 with pulse oximeter and return demonstration;Maintenance of O2 saturations>88%;Exhibits proper breathing techniques, such as pursed lip breathing or other method taught during program session;Compliance with respiratory medication;Demonstrates proper use of MDI's       Oxygen Re-Evaluation:   Oxygen Discharge (Final Oxygen Re-Evaluation):   Initial Exercise Prescription: Initial Exercise Prescription - 02/20/20 1500      Date of Initial Exercise RX and Referring Provider   Date  02/20/20    Referring Provider  Dr. Chase Caller      Treadmill   MPH  2    Grade  0    Minutes  15      NuStep   Level  3    SPM  80    Minutes  15  Prescription Details   Frequency (times per week)  2    Duration  Progress to 30 minutes of continuous aerobic without signs/symptoms of  physical distress      Intensity   THRR 40-80% of Max Heartrate  60-120    Ratings of Perceived Exertion  11-13    Perceived Dyspnea  0-4      Progression   Progression  Continue to progress workloads to maintain intensity without signs/symptoms of physical distress.      Resistance Training   Training Prescription  Yes    Weight  orange bands    Reps  10-15       Perform Capillary Blood Glucose checks as needed.  Exercise Prescription Changes:   Exercise Comments:   Exercise Goals and Review: Exercise Goals    Row Name 02/20/20 1541             Exercise Goals   Increase Physical Activity  Yes       Intervention  Provide advice, education, support and counseling about physical activity/exercise needs.;Develop an individualized exercise prescription for aerobic and resistive training based on initial evaluation findings, risk stratification, comorbidities and participant's personal goals.       Expected Outcomes  Short Term: Attend rehab on a regular basis to increase amount of physical activity.;Long Term: Add in home exercise to make exercise part of routine and to increase amount of physical activity.;Long Term: Exercising regularly at least 3-5 days a week.       Increase Strength and Stamina  Yes       Intervention  Provide advice, education, support and counseling about physical activity/exercise needs.;Develop an individualized exercise prescription for aerobic and resistive training based on initial evaluation findings, risk stratification, comorbidities and participant's personal goals.       Expected Outcomes  Short Term: Increase workloads from initial exercise prescription for resistance, speed, and METs.;Short Term: Perform resistance training exercises routinely during rehab and add in resistance training at home;Long Term: Improve cardiorespiratory fitness, muscular endurance and strength as measured by increased METs and functional capacity (6MWT)       Able to  understand and use rate of perceived exertion (RPE) scale  Yes       Intervention  Provide education and explanation on how to use RPE scale       Expected Outcomes  Short Term: Able to use RPE daily in rehab to express subjective intensity level;Long Term:  Able to use RPE to guide intensity level when exercising independently       Able to understand and use Dyspnea scale  Yes       Intervention  Provide education and explanation on how to use Dyspnea scale       Expected Outcomes  Short Term: Able to use Dyspnea scale daily in rehab to express subjective sense of shortness of breath during exertion;Long Term: Able to use Dyspnea scale to guide intensity level when exercising independently       Knowledge and understanding of Target Heart Rate Range (THRR)  Yes       Intervention  Provide education and explanation of THRR including how the numbers were predicted and where they are located for reference       Expected Outcomes  Short Term: Able to state/look up THRR;Long Term: Able to use THRR to govern intensity when exercising independently;Short Term: Able to use daily as guideline for intensity in rehab       Understanding of Exercise Prescription  Yes       Intervention  Provide education, explanation, and written materials on patient's individual exercise prescription       Expected Outcomes  Short Term: Able to explain program exercise prescription;Long Term: Able to explain home exercise prescription to exercise independently          Exercise Goals Re-Evaluation :   Discharge Exercise Prescription (Final Exercise Prescription Changes):   Nutrition:  Target Goals: Understanding of nutrition guidelines, daily intake of sodium 1500mg , cholesterol 200mg , calories 30% from fat and 7% or less from saturated fats, daily to have 5 or more servings of fruits and vegetables.  Biometrics: Pre Biometrics - 02/20/20 1448      Pre Biometrics   Height  5' (1.524 m)    Weight  66.5 kg     BMI (Calculated)  28.63    Grip Strength  21.5 kg        Nutrition Therapy Plan and Nutrition Goals:   Nutrition Assessments:   Nutrition Goals Re-Evaluation:   Nutrition Goals Discharge (Final Nutrition Goals Re-Evaluation):   Psychosocial: Target Goals: Acknowledge presence or absence of significant depression and/or stress, maximize coping skills, provide positive support system. Participant is able to verbalize types and ability to use techniques and skills needed for reducing stress and depression.  Initial Review & Psychosocial Screening: Initial Psych Review & Screening - 02/20/20 1453      Initial Review   Current issues with  None Identified      Family Dynamics   Good Support System?  Yes      Barriers   Psychosocial barriers to participate in program  There are no identifiable barriers or psychosocial needs.      Screening Interventions   Interventions  Encouraged to exercise       Quality of Life Scores:  Scores of 19 and below usually indicate a poorer quality of life in these areas.  A difference of  2-3 points is a clinically meaningful difference.  A difference of 2-3 points in the total score of the Quality of Life Index has been associated with significant improvement in overall quality of life, self-image, physical symptoms, and general health in studies assessing change in quality of life.  PHQ-9: Recent Review Flowsheet Data    Depression screen [facility] 2/9 02/20/2020 09/03/2017 08/21/2015 03/04/2015   Decreased Interest 0 0 0 0   Down, Depressed, Hopeless 0 0 0 0   PHQ - 2 Score 0 0 0 0   Altered sleeping 0 - - -   Tired, decreased energy 0 - - -   Change in appetite 0 - - -   Feeling bad or failure about yourself  0 - - -   Trouble concentrating 0 - - -   Moving slowly or fidgety/restless 0 - - -   Suicidal thoughts 0 - - -   PHQ-9 Score 0 - - -   Difficult doing work/chores Not difficult at all - - -     Interpretation of Total Score  Total  Score Depression Severity:  1-4 = Minimal depression, 5-9 = Mild depression, 10-14 = Moderate depression, 15-19 = Moderately severe depression, 20-27 = Severe depression   Psychosocial Evaluation and Intervention: Psychosocial Evaluation - 02/20/20 1453      Psychosocial Evaluation & Interventions   Interventions  Encouraged to exercise with the program and follow exercise prescription    Continue Psychosocial Services   No Follow up required       Psychosocial  Re-Evaluation:   Psychosocial Discharge (Final Psychosocial Re-Evaluation):   Education: Education Goals: Education classes will be provided on a weekly basis, covering required topics. Participant will state understanding/return demonstration of topics presented.  Learning Barriers/Preferences: Learning Barriers/Preferences - 02/20/20 1454      Learning Barriers/Preferences   Learning Barriers  None    Learning Preferences  Individual Instruction;Skilled Demonstration;Verbal Instruction       Education Topics: Risk Factor Reduction:  -Group instruction that is supported by a PowerPoint presentation. Instructor discusses the definition of a risk factor, different risk factors for pulmonary disease, and how the heart and lungs work together.     Nutrition for Pulmonary Patient:  -Group instruction provided by PowerPoint slides, verbal discussion, and written materials to support subject matter. The instructor gives an explanation and review of healthy diet recommendations, which includes a discussion on weight management, recommendations for fruit and vegetable consumption, as well as protein, fluid, caffeine, fiber, sodium, sugar, and alcohol. Tips for eating when patients are short of breath are discussed.   Pursed Lip Breathing:  -Group instruction that is supported by demonstration and informational handouts. Instructor discusses the benefits of pursed lip and diaphragmatic breathing and detailed demonstration on how  to preform both.     PULMONARY REHAB OTHER RESPIRATORY from 01/13/2018 in [place]  Date  12/09/17  Educator  pharm D      Oxygen Safety:  -Group instruction provided by PowerPoint, verbal discussion, and written material to support subject matter. There is an overview of "What is Oxygen" and "Why do we need it".  Instructor also reviews how to create a safe environment for oxygen use, the importance of using oxygen as prescribed, and the risks of noncompliance. There is a brief discussion on traveling with oxygen and resources the patient may utilize.   PULMONARY REHAB OTHER RESPIRATORY from 01/13/2018 in [place]  Date  12/30/17  Educator  RN  Instruction Review Code  2- Demonstrated Understanding      Oxygen Equipment:  -Group instruction provided by [facility] Staff utilizing handouts, written materials, and equipment demonstrations.   PULMONARY REHAB OTHER RESPIRATORY from 01/13/2018 in [place]  Date  01/06/18  Educator  Ace Gins      Signs and Symptoms:  -Group instruction provided by written material and verbal discussion to support subject matter. Warning signs and symptoms of infection, stroke, and heart attack are reviewed and when to call the physician/911 reinforced. Tips for preventing the spread of infection discussed.   Advanced Directives:  -Group instruction provided by verbal instruction and written material to support subject matter. Instructor reviews Advanced Directive laws and proper instruction for filling out document.   Pulmonary Video:  -Group video education that reviews the importance of medication and oxygen compliance, exercise, good nutrition, pulmonary hygiene, and pursed lip and diaphragmatic breathing for the pulmonary patient.   Exercise for the Pulmonary Patient:  -Group instruction that is supported by a PowerPoint presentation. Instructor  discusses benefits of exercise, core components of exercise, frequency, duration, and intensity of an exercise routine, importance of utilizing pulse oximetry during exercise, safety while exercising, and options of places to exercise outside of rehab.     PULMONARY REHAB OTHER RESPIRATORY from 01/13/2018 in [place]  Date  01/13/18  Educator  EP  Instruction Review Code  1- Verbalizes Understanding      Pulmonary Medications:  -Verbally interactive group education  provided by instructor with focus on inhaled medications and proper administration.   Anatomy and Physiology of the Respiratory System and Intimacy:  -Group instruction provided by PowerPoint, verbal discussion, and written material to support subject matter. Instructor reviews respiratory cycle and anatomical components of the respiratory system and their functions. Instructor also reviews differences in obstructive and restrictive respiratory diseases with examples of each. Intimacy, Sex, and Sexuality differences are reviewed with a discussion on how relationships can change when diagnosed with pulmonary disease. Common sexual concerns are reviewed.   PULMONARY REHAB OTHER RESPIRATORY from 01/13/2018 in [place]  Date  11/11/17  Educator  rn      MD DAY -A group question and answer session with a medical doctor that allows participants to ask questions that relate to their pulmonary disease state.   PULMONARY REHAB OTHER RESPIRATORY from 01/13/2018 in [place]  Date  12/02/17  Educator  Dr. Nelda Marseille      OTHER EDUCATION -Group or individual verbal, written, or video instructions that support the educational goals of the pulmonary rehab program.   PULMONARY REHAB OTHER RESPIRATORY from 01/13/2018 in [place]  Date  12/16/17 [id number] Eating]  Educator  Cloyde Reams  Instruction Review Code  1-  Verbalizes Understanding [sedentary lifestyles]      Holiday Eating Survival Tips:  -Group instruction provided by PowerPoint slides, verbal discussion, and written materials to support subject matter. The instructor gives patients tips, tricks, and techniques to help them not only survive but enjoy the holidays despite the onslaught of food that accompanies the holidays.   Knowledge Questionnaire Score: Knowledge Questionnaire Score - 02/20/20 1510      Knowledge Questionnaire Score   Pre Score  18/18       Core Components/Risk Factors/Patient Goals at Admission: Personal Goals and Risk Factors at Admission - 02/20/20 1455      Core Components/Risk Factors/Patient Goals on Admission   Improve shortness of breath with ADL's  Yes    Intervention  Provide education, individualized exercise plan and daily activity instruction to help decrease symptoms of SOB with activities of daily living.    Expected Outcomes  Short Term: Improve cardiorespiratory fitness to achieve a reduction of symptoms when performing ADLs;Long Term: Be able to perform more ADLs without symptoms or delay the onset of symptoms       Core Components/Risk Factors/Patient Goals Review:  Goals and Risk Factor Review    Row Name 02/20/20 1456             Core Components/Risk Factors/Patient Goals Review   Personal Goals Review  Develop more efficient breathing techniques such as purse lipped breathing and diaphragmatic breathing and practicing self-pacing with activity.;Increase knowledge of respiratory medications and ability to use respiratory devices properly.;Improve shortness of breath with ADL's          Core Components/Risk Factors/Patient Goals at Discharge (Final Review):  Goals and Risk Factor Review - 02/20/20 1456      Core Components/Risk Factors/Patient Goals Review   Personal Goals Review  Develop more efficient breathing techniques such as purse lipped breathing and diaphragmatic breathing and  practicing self-pacing with activity.;Increase knowledge of respiratory medications and ability to use respiratory devices properly.;Improve shortness of breath with ADL's       ITP Comments:   Comments:

## 2020-02-20 NOTE — Progress Notes (Signed)
Rhonda [name] 69 y.o. female Pulmonary Rehab Orientation Note Patient arrived today in Cardiac and Pulmonary Rehab for orientation to Pulmonary Rehab. [facility] parking deck. She does not carry portable oxygen. Per pt, she uses oxygen occasionally when she exercises, but on her 6 minute walk test today she did not require oxygen at all. Color good, skin warm and dry. Patient is oriented to time and place. Patient's medical history, psychosocial health, and medications reviewed. Psychosocial assessment reveals pt lives with their spouse. Pt is currently retired as a Electrical engineer.  Pt reports her stress level is low. Areas of stress/anxiety include Health.  Pt does not exhibit signs of depression.  PHQ2/9 score 0/0. Pt shows good  coping skills with positive outlook .  Will continue to monitor and evaluate progress toward psychosocial goal(s) of no barriers or psychosocial concerns while in pulmonary rehab. Physical assessment reveals heart rate is normal. Grip strength equal, strong.  Patient reports she does take medications as prescribed. Patient states she follows a Regular diet. The patient reports no specific efforts to gain or lose weight.. Patient's weight will be monitored closely. Demonstration and practice of PLB using pulse oximeter. Patient able to return demonstration satisfactorily. Safety and hand hygiene in the exercise area reviewed with patient. Patient voices understanding of the information reviewed. Department expectations discussed with patient and achievable goals were set. The patient shows enthusiasm about attending the program and we look forward to working with this nice lady. The patient is scheduled completed a 6 min walk test today  and to begin exercise on Tuesday, February 27, 2020 in the 1300 exercise slot.  1350-1600

## 2020-02-22 ENCOUNTER — Ambulatory Visit (INDEPENDENT_AMBULATORY_CARE_PROVIDER_SITE_OTHER): Payer: Medicare Other

## 2020-02-22 DIAGNOSIS — I493 Ventricular premature depolarization: Secondary | ICD-10-CM | POA: Diagnosis not present

## 2020-02-23 ENCOUNTER — Other Ambulatory Visit: Payer: Self-pay | Admitting: Family Medicine

## 2020-02-23 DIAGNOSIS — Z1231 Encounter for screening mammogram for malignant neoplasm of breast: Secondary | ICD-10-CM

## 2020-02-26 ENCOUNTER — Telehealth: Payer: Self-pay | Admitting: Internal Medicine

## 2020-02-26 DIAGNOSIS — J849 Interstitial pulmonary disease, unspecified: Secondary | ICD-10-CM

## 2020-02-26 MED ORDER — OFEV 150 MG PO CAPS: 150.0000 mg | ORAL_CAPSULE | Freq: Two times a day (BID) | ORAL | 2 refills | Status: DC

## 2020-02-26 NOTE — Telephone Encounter (Signed)
Replied via [place].  Closing encounter.   Mariella Saa, PharmD, [place], [place] Clinical Specialty Pharmacist [phone]  02/26/2020 12:20 PM

## 2020-02-26 NOTE — Telephone Encounter (Signed)
Spoke with the pt  She states that [employer] had advised her to take her Questran 4 g before meal and other meds, but then when she picked up rx, it stated on there to take after meal and other meds. She took it the way Amber had recommended, but still wants to get some clarification on this. Please advise thanks!

## 2020-02-27 ENCOUNTER — Encounter (HOSPITAL_COMMUNITY): Payer: Medicare Other

## 2020-02-27 ENCOUNTER — Telehealth (HOSPITAL_COMMUNITY): Payer: Self-pay | Admitting: *Deleted

## 2020-02-27 NOTE — Telephone Encounter (Signed)
Rhonda [name] called out for pulmonary rehab today.  She started Ofev yesterday and has experienced severe stomach pains, diarrhea, and fever.  She is to call Dr. Golden Pop office to report these symptoms.

## 2020-02-29 ENCOUNTER — Encounter (HOSPITAL_COMMUNITY)
Admission: RE | Admit: 2020-02-29 | Discharge: 2020-02-29 | Disposition: A | Discharge status: Home / Self Care | Payer: Medicare Other | Source: Ambulatory Visit | Attending: Internal Medicine | Admitting: Internal Medicine

## 2020-02-29 ENCOUNTER — Other Ambulatory Visit: Payer: Self-pay

## 2020-02-29 DIAGNOSIS — H524 Presbyopia: Secondary | ICD-10-CM | POA: Diagnosis not present

## 2020-02-29 DIAGNOSIS — H26492 Other secondary cataract, left eye: Secondary | ICD-10-CM | POA: Diagnosis not present

## 2020-02-29 DIAGNOSIS — H35372 Puckering of macula, left eye: Secondary | ICD-10-CM | POA: Diagnosis not present

## 2020-02-29 DIAGNOSIS — J849 Interstitial pulmonary disease, unspecified: Secondary | ICD-10-CM | POA: Insufficient documentation

## 2020-02-29 DIAGNOSIS — E119 Type 2 diabetes mellitus without complications: Secondary | ICD-10-CM | POA: Diagnosis not present

## 2020-02-29 NOTE — Progress Notes (Signed)
Daily Session Note  Patient Details  Name: Rhonda [name] MRN: [id number] Date of Birth: July 04, 1951 Referring Provider:     Pulmonary Rehab Walk Test from 02/20/2020 in [place]  Referring Provider  Dr. Chase Caller      Encounter Date: 02/29/2020  Check In: Session Check In - 02/29/20 1402      Check-In   Supervising physician immediately available to respond to emergencies  [facility] immediately available    Physician(s)  Dr. Broadus [name]    Location  [facility]    Staff Present  Hoy Register, MS, Exercise Physiologist;[name] Ysidro Evert, RN;Joan Leonia Reeves, RN, BSN    Virtual Visit  No    Medication changes reported      No    Fall or balance concerns reported     No    Tobacco Cessation  No Change    Warm-up and Cool-down  Performed on first and last piece of equipment    Resistance Training Performed  Yes    VAD Patient?  No    PAD/SET Patient?  No      Pain Assessment   Currently in Pain?  No/denies    Multiple Pain Sites  No       Capillary Blood Glucose: No results found for this or any previous visit (from the past 24 hour(s)).    Social History   Tobacco Use  Smoking Status Never Smoker  Smokeless Tobacco Never Used  Tobacco Comment   pt states she experimented in college    Goals Met:  Independence with exercise equipment Exercise tolerated well Strength training completed today  Goals Unmet:  Not Applicable  Comments: Service time is from 1250 to 1355    Dr. Fransico Him is Medical Director for Cardiac Rehab at [facility].

## 2020-03-04 NOTE — Progress Notes (Signed)
Cardiology Office Note   Date:  03/06/2020   ID:  Rhonda [name], DOB 1951-04-20, MRN [id number]  PCP:  Hayden Rasmussen, MD  Cardiologist:   Minus Breeding, MD   No chief complaint on file.     History of Present Illness: Rhonda [name] is a 69 y.o. female who presents for follow up of coronary calcium and PVCs.  She had a normal nuclear stress test in 2020.  She has premature ventricular contractions.  She has chronic interstitial lung disease.  She does not have home O2.  Her calcium score was 343.    Since I last saw her she was on O2 but was managed with prednisone and her pulmonary fibrosis seems to be better.  She is back in pulmonary rehab.  There she does note that her blood pressures sometimes are elevated in the 140s to 150s but comes down to the 110s by the time she leaves.  She has been feeling like her heart is pounding.  She wore a Holter but I do not have these results.  She had overnight pulse oximetry and was told that her heart rate was going down at night.  She has not had any presyncope or syncope.  She will occasionally get some palpitations.  She is not describing any chest pressure, neck or arm discomfort.  She has no new PND or orthopnea.  She has had no weight gain or edema.  She was started on Ofev and thinks she had a reaction to this with some GI issues and other complaints.  She is going to discuss this with her pulmonologist.   Past Medical History:  Diagnosis Date  . Allergy   . Arthritis    history spinal stenosis. osteoarthritis right hip  . Asthma   . Cataract   . Coronary artery calcification seen on CAT scan 08/19/2017   >300 on CT scan 08/2017  . DDD (degenerative disc disease), thoracic   . Depression   . DOE (dyspnea on exertion)    a. 04/2010 Lexi MV EF 71%, no ischemia/infarct;    . Fatty liver   . GERD (gastroesophageal reflux disease)   . H/O steroid therapy    Steroid use orally over 4 yrs- for Lung Fibrosis  . Heart  palpitations 02/28/2015  . Helicobacter pylori ab+   . Hemorrhoids   . Hiatal hernia   . High cholesterol   . History of chronic bronchitis    as child  . History of migraine   . History of MRSA infection   . Hyperlipidemia, mixed 08/19/2017  . Hyperplastic colon polyp 2007  . IBS (irritable bowel syndrome)   . Inguinal hernia    right  . Insulin resistance    past  . Interstitial lung disease ([place])   . MVP (mitral valve prolapse)    Posterior mitral valve leaflet with mild MR  . Pneumonia   . Pneumonitis, hypersensitivity ([place])    a. 09/2012 s/p Bx - ? 2/2 bird, mold, oil paint exposure ->on steroids, followed by pulm.  Marland Kitchen PONV (postoperative nausea and vomiting)   . Pre-diabetes    takes Metformin  . Pulmonary fibrosis (HCC)    Dr. Chase Caller follows- stable at present  . PVC (premature ventricular contraction) 08/19/2017  . Rapid heart rate    Dr. Radford Pax follows- last visit Epic note 9'16  . Squamous cell carcinoma of skin   . Tinnitus, right ear     Past Surgical History:  Procedure  Laterality Date  . [place] STUDY N/A 02/21/2018   Procedure: [place] STUDY;  Surgeon: Mauri Pole, MD;  Location: [facility];  Service: Endoscopy;  Laterality: N/A;  . BREAST BIOPSY Right 2009   BIOPSY, pt denies  . BREAST BIOPSY Left 2003   Benign   . CATARACT EXTRACTION Left   . CESAREAN SECTION    . COLONOSCOPY    . ESOPHAGEAL MANOMETRY N/A 02/21/2018   Procedure: ESOPHAGEAL MANOMETRY (EM);  Surgeon: Mauri Pole, MD;  Location: [facility];  Service: Endoscopy;  Laterality: N/A;  . FOOT FRACTURE SURGERY  2006 or 2007   right  . HYMENECTOMY    . LUNG BIOPSY  09/28/2012   Procedure: LUNG BIOPSY;  Surgeon: Melrose Nakayama, MD;  Location: [facility];  Service: Thoracic;  Laterality: N/A;  lung biopsies tims three  . SQUAMOUS CELL CARCINOMA EXCISION Left    left arm  . TOTAL HIP ARTHROPLASTY Right 09/10/2015   Procedure: RIGHT TOTAL HIP ARTHROPLASTY ANTERIOR APPROACH;   Surgeon: Paralee Cancel, MD;  Location: [facility];  Service: Orthopedics;  Laterality: Right;  . TOTAL HIP ARTHROPLASTY Left 10/03/2019   Procedure: TOTAL HIP ARTHROPLASTY ANTERIOR APPROACH;  Surgeon: Paralee Cancel, MD;  Location: [facility];  Service: Orthopedics;  Laterality: Left;  70 mins  . TUBAL LIGATION    . UPPER GI ENDOSCOPY    . VIDEO ASSISTED THORACOSCOPY  09/28/2012   Procedure: VIDEO ASSISTED THORACOSCOPY;  Surgeon: Melrose Nakayama, MD;  Location: [facility];  Service: Thoracic;  Laterality: Right;  Marland Kitchen VIDEO BRONCHOSCOPY  11/19/2011   Procedure: VIDEO BRONCHOSCOPY WITH FLUORO;  Surgeon: Brand Males, MD;  Location: [facility] ENDOSCOPY;  Service: Endoscopy;;     Current Outpatient Medications  Medication Sig Dispense Refill  . acetaminophen (TYLENOL) 500 MG tablet Tylenol Extra Strength    . albuterol (PROAIR HFA) 108 (90 Base) MCG/ACT inhaler Inhale 2 puffs into the lungs every 4 (four) hours as needed for wheezing. Or coughing spells.  You may use 2 Puffs 5-10 minutes before exercise. 1 Inhaler 3  . Alpha-D-Galactosidase (BEANO PO) Take 1 tablet by mouth as needed (if eating onion or garlic).     . Ascorbic Acid (VITAMIN C) 1000 MG tablet Take 1,000 mg by mouth daily.    Marland Kitchen aspirin EC 81 MG tablet Take 81 mg by mouth daily.    . Calcium-Vitamin D-Vitamin K (VIACTIV) [id number] MG-UNT-MCG CHEW Chew 2 tablets by mouth daily.     . chlorpheniramine (CHLOR-TRIMETON) 4 MG tablet Take 2 mg by mouth 2 (two) times daily. Takes half tablet twice daily    . dicyclomine (BENTYL) 10 MG capsule Take 1 capsule (10 mg total) by mouth 2 (two) times daily as needed for spasms. 60 capsule 1  . EPINEPHrine 0.3 mg/0.3 mL IJ SOAJ injection Inject into the muscle.    . ezetimibe (ZETIA) 10 MG tablet TAKE 1 TABLET (10 MG TOTAL) BY MOUTH DAILY. PLEASE KEEP UPCOMING APPT FOR FUTURE REFILLS. THANK YOU. 90 tablet 3  . fluticasone (FLONASE) 50 MCG/ACT nasal spray SPRAY 2 SPRAYS INTO EACH NOSTRIL EVERY DAY 48 mL 1  .  fluticasone furoate-vilanterol (BREO ELLIPTA) 100-25 MCG/INH AEPB Inhale 1 puff into the lungs daily. 180 each 3  . IRON-VITAMIN C PO Take by mouth.    . lactase (LACTAID) 3000 units tablet Take by mouth.    . metFORMIN (GLUCOPHAGE-XR) 500 MG 24 hr tablet Take 500 mg by mouth daily with supper.    Marland Kitchen  metoprolol succinate (TOPROL-XL) 50 MG 24 hr tablet Take 50 mg by mouth daily.     . montelukast (SINGULAIR) 10 MG tablet TAKE 1 TABLET BY MOUTH EVERYDAY AT BEDTIME 90 tablet 2  . Multiple Vitamin (MULTIVITAMIN) tablet Take 1 tablet by mouth daily.    . nitrofurantoin, macrocrystal-monohydrate, (MACROBID) 100 MG capsule Take 100 mg by mouth 2 (two) times daily.    Marland Kitchen nystatin-triamcinolone (MYCOLOG II) cream nystatin-triamcinolone 100,000 unit/g-0.1 % topical cream    . omeprazole (PRILOSEC) 20 MG capsule Take 1 capsule (20 mg total) by mouth daily. 30 capsule 5  . pravastatin (PRAVACHOL) 40 MG tablet Take 40 mg by mouth every evening.     . predniSONE (DELTASONE) 10 MG tablet TAKE 1 TABLET (10 MG TOTAL) BY MOUTH DAILY WITH BREAKFAST. 90 tablet 1  . predniSONE (DELTASONE) 10 MG tablet Take 4 tabs daily x1week, 3 tabs daily x1week, 2 tabs daily x1week, then continue on 1 tab daily 80 tablet 0  . Probiotic Product (PROBIOTIC ADVANCED PO) Take 2 capsules by mouth daily.     . Turmeric 500 MG CAPS Take 500 mg by mouth daily.     . Vitamin D, Ergocalciferol, (DRISDOL) 1.25 MG (50000 UNIT) CAPS capsule Take 50,000 Units by mouth once a week.    . Nintedanib (OFEV) 150 MG CAPS Take 1 capsule (150 mg total) by mouth 2 (two) times daily. (Patient not taking: Reported on 03/06/2020) 60 capsule 2  . nitroGLYCERIN (NITROSTAT) 0.4 MG SL tablet Place 1 tablet (0.4 mg total) under the tongue every 5 (five) minutes as needed for chest pain. 25 tablet 12   No current facility-administered medications for this visit.    Allergies:   Atorvastatin, Betadine [povidone iodine], Codeine, Garlic, Hydrocodone, Onion,  Rosuvastatin, Shellfish allergy, Sulfa antibiotics, and Sulfonamide derivatives    ROS:  Please see the history of present illness.   Otherwise, review of systems are positive for none.   All other systems are reviewed and negative.    PHYSICAL EXAM: VS:  BP 128/70   Pulse 76   Temp (!) 97.1 F (36.2 C)   Ht 5' (1.524 m)   Wt 142 lb 9.6 oz (64.7 kg)   SpO2 97%   BMI 27.85 kg/m  , BMI Body mass index is 27.85 kg/m. GENERAL:  Well appearing NECK:  No jugular venous distention, waveform within normal limits, carotid upstroke brisk and symmetric, no bruits, no thyromegaly LUNGS:  Clear to auscultation bilaterally CHEST:  Unremarkable HEART:  PMI not displaced or sustained,S1 and S2 within normal limits, no S3, no S4, no clicks, no rubs, no murmurs ABD:  Flat, positive bowel sounds normal in frequency in pitch, no bruits, no rebound, no guarding, no midline pulsatile mass, no hepatomegaly, no splenomegaly EXT:  2 plus pulses throughout, no edema, no cyanosis no clubbing   EKG:  EKG is ordered today. Sinus rhythm, rate 76, axis within normal limits, intervals within normal limits, nonspecific inferolateral T wave inversions.  This is unchanged compared with the previous.   Recent Labs: 02/06/2020: ALT 15; BUN 24; Creatinine, Ser 0.97; Hemoglobin 14.8; Platelets 250.0; Potassium 4.4; Sodium 135    Lipid Panel    Component Value Date/Time   CHOL 177 12/08/2018 0917   TRIG 131 12/08/2018 0917   HDL 68 12/08/2018 0917   CHOLHDL 2.6 12/08/2018 0917   CHOLHDL 3.5 02/26/2015 1540   VLDL 37 02/26/2015 1540   LDLCALC 83 12/08/2018 0917      Wt Readings from  Last 3 Encounters:  03/06/20 142 lb 9.6 oz (64.7 kg)  03/05/20 146 lb (66.2 kg)  02/20/20 146 lb 9.7 oz (66.5 kg)      Other studies Reviewed: Additional studies/ records that were reviewed today include: Lexiscan Myoview.. Review of the above records demonstrates:  Please see elsewhere in the note.     ASSESSMENT AND  PLAN:  Coronary artery calcifications on CT -    She had a low risk nuclear study.   She has had no overt symptoms suggestive of obstructive coronary disease.  I am going to watch this closely.  She tells me her lipids are excellent.  Her blood pressure is at target.  I probably consider trying to screen her with treadmill testing in the future although this might be difficult given her nonspecific baseline EKG changes.  PVCs -    The results of the Holter are not available to me today.  I will review this when available.   Dyslipidemia -      LDL  83 on 1//16/2020.  She will continue on Pravastatin 40mg  daily and Zetia 10mg  daily.   Pulmonary HTN -   We had a long discussion about this.  We had a discussion about the relative risk of calcium supplementation but if she has significant bone density issues I would suggest that she go ahead and take the calcium as prescribed by her primary provider.  We also discussed the risk of thrombosis and myocardial infarction associated with Ofev.  But again this risk has to be weighed against the potential benefit.  She would look out for symptoms of side effects suggestive of thrombosis or angina.  I do not think she has an absolute contraindication.  Covid Education -    She has had her vaccine.    Current medicines are reviewed at length with the patient today.  The patient does not have concerns regarding medicines.  The following changes have been made:  no change  Labs/ tests ordered today include: None  Orders Placed This Encounter  Procedures  . EKG 12-Lead     Disposition:   FU with me in 12 months.     Signed, Minus Breeding, MD  03/06/2020 10:25 AM    [facility]

## 2020-03-05 ENCOUNTER — Other Ambulatory Visit: Payer: Self-pay

## 2020-03-05 ENCOUNTER — Encounter (HOSPITAL_COMMUNITY)
Admission: RE | Admit: 2020-03-05 | Discharge: 2020-03-05 | Disposition: A | Discharge status: Home / Self Care | Payer: Medicare Other | Source: Ambulatory Visit | Attending: Internal Medicine | Admitting: Internal Medicine

## 2020-03-05 VITALS — Ht 60.0 in | Wt 146.0 lb

## 2020-03-05 DIAGNOSIS — J849 Interstitial pulmonary disease, unspecified: Secondary | ICD-10-CM

## 2020-03-05 NOTE — Progress Notes (Signed)
Rhonda [name] 69 y.o. female Nutrition Note  Visit Diagnosis: Interstitial lung disease [facility])   Past Medical History:  Diagnosis Date  . Allergy   . Arthritis    history spinal stenosis. osteoarthritis right hip  . Asthma   . Cataract   . Coronary artery calcification seen on CAT scan 08/19/2017   >300 on CT scan 08/2017  . DDD (degenerative disc disease), thoracic   . Depression   . DOE (dyspnea on exertion)    a. 04/2010 Lexi MV EF 71%, no ischemia/infarct;    . Fatty liver   . GERD (gastroesophageal reflux disease)   . H/O steroid therapy    Steroid use orally over 4 yrs- for Lung Fibrosis  . Heart palpitations 02/28/2015  . Helicobacter pylori ab+   . Hemorrhoids   . Hiatal hernia   . High cholesterol   . History of chronic bronchitis    as child  . History of migraine   . History of MRSA infection   . Hyperlipidemia, mixed 08/19/2017  . Hyperplastic colon polyp 2007  . IBS (irritable bowel syndrome)   . Inguinal hernia    right  . Insulin resistance    past  . Interstitial lung disease ([place])   . MVP (mitral valve prolapse)    Posterior mitral valve leaflet with mild MR  . Pneumonia   . Pneumonitis, hypersensitivity ([place])    a. 09/2012 s/p Bx - ? 2/2 bird, mold, oil paint exposure ->on steroids, followed by pulm.  Marland Kitchen PONV (postoperative nausea and vomiting)   . Pre-diabetes    takes Metformin  . Pulmonary fibrosis (HCC)    Dr. Chase Caller follows- stable at present  . PVC (premature ventricular contraction) 08/19/2017  . Rapid heart rate    Dr. Radford Pax follows- last visit Epic note 9'16  . Squamous cell carcinoma of skin   . Tinnitus, right ear      Medications reviewed.   Current Outpatient Medications:  .  acetaminophen (TYLENOL) 500 MG tablet, Tylenol Extra Strength, Disp: , Rfl:  .  albuterol (PROAIR HFA) 108 (90 Base) MCG/ACT inhaler, Inhale 2 puffs into the lungs every 4 (four) hours as needed for wheezing. Or coughing spells.  You may use 2 Puffs  5-10 minutes before exercise., Disp: 1 Inhaler, Rfl: 3 .  Alpha-D-Galactosidase (BEANO PO), Take 1 tablet by mouth as needed (if eating onion or garlic). , Disp: , Rfl:  .  Ascorbic Acid (VITAMIN C) 1000 MG tablet, Take 1,000 mg by mouth daily., Disp: , Rfl:  .  aspirin EC 81 MG tablet, Take 81 mg by mouth daily., Disp: , Rfl:  .  Calcium-Vitamin D-Vitamin K (VIACTIV) [id number] MG-UNT-MCG CHEW, Chew 2 tablets by mouth daily. , Disp: , Rfl:  .  chlorpheniramine (CHLOR-TRIMETON) 4 MG tablet, Take 2 mg by mouth 2 (two) times daily. Takes half tablet twice daily, Disp: , Rfl:  .  dicyclomine (BENTYL) 10 MG capsule, Take 1 capsule (10 mg total) by mouth 2 (two) times daily as needed for spasms., Disp: 60 capsule, Rfl: 1 .  EPINEPHrine 0.3 mg/0.3 mL IJ SOAJ injection, Inject into the muscle., Disp: , Rfl:  .  ezetimibe (ZETIA) 10 MG tablet, TAKE 1 TABLET (10 MG TOTAL) BY MOUTH DAILY. PLEASE KEEP UPCOMING APPT FOR FUTURE REFILLS. THANK YOU., Disp: 90 tablet, Rfl: 3 .  fluticasone (FLONASE) 50 MCG/ACT nasal spray, SPRAY 2 SPRAYS INTO EACH NOSTRIL EVERY DAY, Disp: 48 mL, Rfl: 1 .  fluticasone furoate-vilanterol (BREO ELLIPTA)  100-25 MCG/INH AEPB, Inhale 1 puff into the lungs daily., Disp: 180 each, Rfl: 3 .  IRON-VITAMIN C PO, Take by mouth., Disp: , Rfl:  .  lactase (LACTAID) 3000 units tablet, Take by mouth., Disp: , Rfl:  .  metFORMIN (GLUCOPHAGE-XR) 500 MG 24 hr tablet, Take 500 mg by mouth daily with supper., Disp: , Rfl:  .  metoprolol succinate (TOPROL-XL) 50 MG 24 hr tablet, Take 50 mg by mouth daily. , Disp: , Rfl:  .  montelukast (SINGULAIR) 10 MG tablet, TAKE 1 TABLET BY MOUTH EVERYDAY AT BEDTIME, Disp: 90 tablet, Rfl: 2 .  Multiple Vitamin (MULTIVITAMIN) tablet, Take 1 tablet by mouth daily., Disp: , Rfl:  .  Nintedanib (OFEV) 150 MG CAPS, Take 1 capsule (150 mg total) by mouth 2 (two) times daily., Disp: 60 capsule, Rfl: 2 .  nitrofurantoin, macrocrystal-monohydrate, (MACROBID) 100 MG capsule,  Take 100 mg by mouth 2 (two) times daily., Disp: , Rfl:  .  nitroGLYCERIN (NITROSTAT) 0.4 MG SL tablet, Place 1 tablet (0.4 mg total) under the tongue every 5 (five) minutes as needed for chest pain., Disp: 25 tablet, Rfl: 12 .  nystatin-triamcinolone (MYCOLOG II) cream, nystatin-triamcinolone 100,000 unit/g-0.1 % topical cream, Disp: , Rfl:  .  omeprazole (PRILOSEC) 20 MG capsule, Take 1 capsule (20 mg total) by mouth daily., Disp: 30 capsule, Rfl: 5 .  pravastatin (PRAVACHOL) 40 MG tablet, Take 40 mg by mouth every evening. , Disp: , Rfl:  .  predniSONE (DELTASONE) 10 MG tablet, TAKE 1 TABLET (10 MG TOTAL) BY MOUTH DAILY WITH BREAKFAST., Disp: 90 tablet, Rfl: 1 .  predniSONE (DELTASONE) 10 MG tablet, Take 4 tabs daily x1week, 3 tabs daily x1week, 2 tabs daily x1week, then continue on 1 tab daily (Patient not taking: Reported on 02/20/2020), Disp: 80 tablet, Rfl: 0 .  Probiotic Product (PROBIOTIC ADVANCED PO), Take 2 capsules by mouth daily. , Disp: , Rfl:  .  Turmeric 500 MG CAPS, Take 500 mg by mouth daily. , Disp: , Rfl:    Ht Readings from Last 1 Encounters:  02/20/20 5' (1.524 m)     Wt Readings from Last 3 Encounters:  02/20/20 146 lb 9.7 oz (66.5 kg)  02/13/20 145 lb 3.2 oz (65.9 kg)  01/30/20 146 lb (66.2 kg)     There is no height or weight on file to calculate BMI.   Social History   Tobacco Use  Smoking Status Never Smoker  Smokeless Tobacco Never Used  Tobacco Comment   pt states she experimented in college    Nutrition Note  Spoke with pt. Nutrition Plan and Nutrition Survey goals reviewed with pt.   Pt has Type 2 Diabetes. Last A1c indicates blood glucose well-controlled.  Pt checks CBG's 3-4 times per week  Fasting CBG's reportedly 90-100 mg/dL. Post prandial CBGs <110 mg/dl   Pt has been following the Lipid Clinic Diet from [facility]. She feels happy with the diet. Her cholesterol and A1C are all WNL. She states she wants to lose about 10 lbs. She is back to exercising  in the pool, walking, and attending pulmonary rehab.  Pt expressed understanding of the information reviewed.   Nutrition Diagnosis ? Overweight  related to excessive energy intake as evidenced by a 28.51kg/m2  Nutrition Intervention ? Pt's individual nutrition plan reviewed with pt. ? Benefits of adopting healthy diet reviewed with Rate My Plate survey   ? Continue client-centered nutrition education by RD, as part of interdisciplinary care.  Goal(s) ? Pt  to identify food quantities necessary to achieve weight loss of 4-10 lb at graduation from cardiac rehab.  ? Pt to build a healthy plate including vegetables, fruits, whole grains, and low-fat dairy products in a heart healthy meal plan. Plan:   Will provide client-centered nutrition education as part of interdisciplinary care  Monitor and evaluate progress toward nutrition goal with team.   Michaele Offer, MS, RDN, LDN

## 2020-03-05 NOTE — Progress Notes (Signed)
Daily Session Note  Patient Details  Name: Rhonda [name] MRN: [id number] Date of Birth: 1950-12-23 Referring Provider:     Pulmonary Rehab Walk Test from 02/20/2020 in [place]  Referring Provider  Dr. Chase Caller      Encounter Date: 03/05/2020  Check In: Session Check In - 03/05/20 [place]      Check-In   Supervising physician immediately available to respond to emergencies  [facility] immediately available    Physician(s)  Dr. Doristine Bosworth    Location  [facility]    Staff Present  Rosebud Poles, RN, Bjorn Loser, MS, Exercise Physiologist;[name] Ysidro Evert, RN    Virtual Visit  No    Medication changes reported      No    Fall or balance concerns reported     No    Warm-up and Cool-down  Performed on first and last piece of equipment    Resistance Training Performed  No    VAD Patient?  No    PAD/SET Patient?  No      Pain Assessment   Currently in Pain?  No/denies    Multiple Pain Sites  No       Capillary Blood Glucose: No results found for this or any previous visit (from the past 24 hour(s)).    Social History   Tobacco Use  Smoking Status Never Smoker  Smokeless Tobacco Never Used  Tobacco Comment   pt states she experimented in college    Goals Met:  Proper associated with RPD/PD & O2 Sat Exercise tolerated well Strength training completed today  Goals Unmet:  Not Applicable  Comments: Service time is from 1250 to 1400    Dr. Fransico Him is Medical Director for Cardiac Rehab at [facility].

## 2020-03-06 ENCOUNTER — Ambulatory Visit (INDEPENDENT_AMBULATORY_CARE_PROVIDER_SITE_OTHER): Payer: Medicare Other | Admitting: Cardiology

## 2020-03-06 ENCOUNTER — Telehealth: Payer: Self-pay | Admitting: Internal Medicine

## 2020-03-06 ENCOUNTER — Encounter: Payer: Self-pay | Admitting: Cardiology

## 2020-03-06 VITALS — BP 128/70 | HR 76 | Temp 97.1°F | Ht 60.0 in | Wt 142.6 lb

## 2020-03-06 DIAGNOSIS — E785 Hyperlipidemia, unspecified: Secondary | ICD-10-CM

## 2020-03-06 DIAGNOSIS — I493 Ventricular premature depolarization: Secondary | ICD-10-CM | POA: Diagnosis not present

## 2020-03-06 DIAGNOSIS — I272 Pulmonary hypertension, unspecified: Secondary | ICD-10-CM | POA: Insufficient documentation

## 2020-03-06 DIAGNOSIS — Z7189 Other specified counseling: Secondary | ICD-10-CM

## 2020-03-06 DIAGNOSIS — R931 Abnormal findings on diagnostic imaging of heart and coronary circulation: Secondary | ICD-10-CM | POA: Diagnosis not present

## 2020-03-06 NOTE — Telephone Encounter (Signed)
Attempted to call pt but unable to reach. Left pt a detailed message on machine in regards to the results of the ono and that she does not need to wear O2 at night. Nothing further needed.

## 2020-03-06 NOTE — Patient Instructions (Signed)
Medication Instructions:  NO CHANGES *If you need a refill on your cardiac medications before your next appointment, please call your pharmacy*  Lab Work: NONE ORDERED THIS VISIT.  Testing/Procedures: NONE ORDERED THIS VISIT.  Follow-Up: At [facility], you and your health needs are our priority.  As part of our continuing mission to provide you with exceptional heart care, we have created designated Provider Care Teams.  These Care Teams include your primary Cardiologist (physician) and Advanced Practice Providers (APPs -  Physician Assistants and Nurse Practitioners) who all work together to provide you with the care you need, when you need it.   Your next appointment:   6 month(s)  You will receive a reminder letter in the mail or through MyChart two months in advance. If you don't receive a letter, please call our office to schedule the follow-up appointment.  The format for your next appointment:   In Person  Provider:   Minus Breeding, MD

## 2020-03-06 NOTE — Telephone Encounter (Signed)
Rhonda [name] 0 Had overnight oxygen study on 12/10/2019.  Desaturation below 88% was only 0.4 minutes.  Oxygen has not required

## 2020-03-07 ENCOUNTER — Encounter (HOSPITAL_COMMUNITY)
Admission: RE | Admit: 2020-03-07 | Discharge: 2020-03-07 | Disposition: A | Discharge status: Home / Self Care | Payer: Medicare Other | Source: Ambulatory Visit | Attending: Internal Medicine | Admitting: Internal Medicine

## 2020-03-07 ENCOUNTER — Other Ambulatory Visit: Payer: Self-pay

## 2020-03-07 DIAGNOSIS — J849 Interstitial pulmonary disease, unspecified: Secondary | ICD-10-CM

## 2020-03-07 NOTE — Progress Notes (Signed)
Daily Session Note  Patient Details  Name: Rhonda [name] MRN: [id number] Date of Birth: 10-30-1951 Referring Provider:     Pulmonary Rehab Walk Test from 02/20/2020 in [place]  Referring Provider  Dr. Chase Caller      Encounter Date: 03/07/2020  Check In: Session Check In - 03/07/20 1513      Check-In   Supervising physician immediately available to respond to emergencies  [facility] immediately available    Physician(s)  Dr. Erlinda Hong    Location  [facility]    Staff Present  Rosebud Poles, RN, Bjorn Loser, MS, Exercise Physiologist;Jozlynn Plaia Ysidro Evert, RN    Virtual Visit  No    Medication changes reported      No    Fall or balance concerns reported     No    Tobacco Cessation  No Change    Warm-up and Cool-down  Performed on first and last piece of equipment    Resistance Training Performed  Yes    VAD Patient?  No    PAD/SET Patient?  No      Pain Assessment   Currently in Pain?  No/denies    Multiple Pain Sites  No       Capillary Blood Glucose: No results found for this or any previous visit (from the past 24 hour(s)).    Social History   Tobacco Use  Smoking Status Never Smoker  Smokeless Tobacco Never Used  Tobacco Comment   pt states she experimented in college    Goals Met:  Exercise tolerated well No report of cardiac concerns or symptoms Strength training completed today  Goals Unmet:  Not Applicable  Comments: Service time is from 1300 to 1405    Dr. Fransico Him is Medical Director for Cardiac Rehab at [name].

## 2020-03-12 ENCOUNTER — Encounter (HOSPITAL_COMMUNITY)
Admission: RE | Admit: 2020-03-12 | Discharge: 2020-03-12 | Disposition: A | Discharge status: Home / Self Care | Payer: Medicare Other | Source: Ambulatory Visit | Attending: Internal Medicine | Admitting: Internal Medicine

## 2020-03-12 ENCOUNTER — Telehealth: Payer: Self-pay | Admitting: Cardiology

## 2020-03-12 ENCOUNTER — Telehealth: Payer: Self-pay | Admitting: Internal Medicine

## 2020-03-12 ENCOUNTER — Other Ambulatory Visit: Payer: Self-pay

## 2020-03-12 VITALS — Wt 135.1 lb

## 2020-03-12 DIAGNOSIS — J849 Interstitial pulmonary disease, unspecified: Secondary | ICD-10-CM

## 2020-03-12 NOTE — Progress Notes (Signed)
I have reviewed a Home Exercise Prescription with Rhonda [name] . Rhonda [name] is  currently exercising at home.  The patient was advised to walk 3 days a week for 30-45 minutes.  Rhonda [name] and I discussed how to progress their exercise prescription.  The patient stated that their goals were to be able to dance again.  The patient stated that they understand the exercise prescription.  We reviewed exercise guidelines, target heart rate during exercise, RPE Scale, weather conditions, NTG use, endpoints for exercise, warmup and cool down.  Patient is encouraged to come to me with any questions. I will continue to follow up with the patient to assist them with progression and safety.

## 2020-03-12 NOTE — Telephone Encounter (Signed)
Patient states she is returning call to Dr. Rosezella [place] nurse, Lonn [place]. I transferred patient to [facility].

## 2020-03-12 NOTE — Progress Notes (Signed)
Daily Session Note  Patient Details  Name: Rhonda [name] MRN: [id number] Date of Birth: 1951-03-17 Referring Provider:     Pulmonary Rehab Walk Test from 02/20/2020 in [place]  Referring Provider  Dr. Chase Caller      Encounter Date: 03/12/2020  Check In: Session Check In - 03/12/20 1326      Check-In   Supervising physician immediately available to respond to emergencies  [facility] immediately available    Physician(s)  Dr. Tawanna Solo    Location  [facility]    Staff Present  Rosebud Poles, RN, Bjorn Loser, MS, Exercise Physiologist;Pranit Owensby Ysidro Evert, RN    Virtual Visit  No    Medication changes reported      No    Fall or balance concerns reported     No    Tobacco Cessation  No Change    Warm-up and Cool-down  Performed as group-led instruction    Resistance Training Performed  Yes    VAD Patient?  No    PAD/SET Patient?  No      Pain Assessment   Currently in Pain?  No/denies    Multiple Pain Sites  No       Capillary Blood Glucose: No results found for this or any previous visit (from the past 24 hour(s)). POCT Glucose - 03/12/20 1434      POCT Blood Glucose   Pre-Exercise  131 mg/dL    Post-Exercise  167 mg/dL      Exercise Prescription Changes - 03/12/20 1400      Response to Exercise   Blood Pressure (Admit)  104/60    Blood Pressure (Exercise)  110/52    Blood Pressure (Exit)  104/64    Heart Rate (Admit)  81 bpm    Heart Rate (Exercise)  99 bpm    Heart Rate (Exit)  82 bpm    Oxygen Saturation (Admit)  97 %    Oxygen Saturation (Exercise)  88 %    Oxygen Saturation (Exit)  95 %    Rating of Perceived Exertion (Exercise)  15    Perceived Dyspnea (Exercise)  3    Duration  Continue with 30 min of aerobic exercise without signs/symptoms of physical distress.    Intensity  Other (comment)   40-80% of HRR     Progression   Progression  Continue to progress workloads to maintain intensity  without signs/symptoms of physical distress.      Resistance Training   Training Prescription  Yes    Weight  orange bands    Reps  10-15    Time  10 Minutes      Treadmill   MPH  1.7    Grade  0    Minutes  15      NuStep   Level  3    SPM  80    Minutes  15    METs  2.1       Social History   Tobacco Use  Smoking Status Never Smoker  Smokeless Tobacco Never Used  Tobacco Comment   pt states she experimented in college    Goals Met:  Exercise tolerated well No report of cardiac concerns or symptoms Strength training completed today  Goals Unmet:  Not Applicable  Comments: Service time is from 1245 to 1345    Dr. Fransico Him is Medical Director for Cardiac Rehab at [facility].

## 2020-03-12 NOTE — Telephone Encounter (Signed)
lmtcb for pt. ONO has not been scanned into pt's chart yet. Also, Dr. Percival Spanish is in [place].

## 2020-03-13 ENCOUNTER — Ambulatory Visit
Admission: RE | Admit: 2020-03-13 | Discharge: 2020-03-13 | Disposition: A | Discharge status: Home / Self Care | Payer: Medicare Other | Source: Ambulatory Visit | Attending: Family Medicine | Admitting: Family Medicine

## 2020-03-13 DIAGNOSIS — Z1231 Encounter for screening mammogram for malignant neoplasm of breast: Secondary | ICD-10-CM

## 2020-03-13 NOTE — Telephone Encounter (Signed)
LMTCB x2 for pt 

## 2020-03-13 NOTE — Progress Notes (Signed)
Pulmonary Individual Treatment Plan  Patient Details  Name: Ulonda A Sweeney MRN: [id number] Date of Birth: 07/25/1951 Referring Provider:     Pulmonary Rehab Walk Test from 02/20/2020 in [facility]  Referring Provider  Dr. Ramaswamy      Initial Encounter Date:    Pulmonary Rehab Walk Test from 02/20/2020 in [facility]  Date  02/20/20      Visit Diagnosis: Interstitial lung disease (HCC)  Patient's Home Medications on Admission:   Current Outpatient Medications:  .  acetaminophen (TYLENOL) 500 MG tablet, Tylenol Extra Strength, Disp: , Rfl:  .  albuterol (PROAIR HFA) 108 (90 Base) MCG/ACT inhaler, Inhale 2 puffs into the lungs every 4 (four) hours as needed for wheezing. Or coughing spells.  You may use 2 Puffs 5-10 minutes before exercise., Disp: 1 Inhaler, Rfl: 3 .  Alpha-D-Galactosidase (BEANO PO), Take 1 tablet by mouth as needed (if eating onion or garlic). , Disp: , Rfl:  .  Ascorbic Acid (VITAMIN C) 1000 MG tablet, Take 1,000 mg by mouth daily., Disp: , Rfl:  .  aspirin EC 81 MG tablet, Take 81 mg by mouth daily., Disp: , Rfl:  .  Calcium-Vitamin D-Vitamin K (VIACTIV) 500-500-40 MG-UNT-MCG CHEW, Chew 2 tablets by mouth daily. , Disp: , Rfl:  .  chlorpheniramine (CHLOR-TRIMETON) 4 MG tablet, Take 2 mg by mouth 2 (two) times daily. Takes half tablet twice daily, Disp: , Rfl:  .  dicyclomine (BENTYL) 10 MG capsule, Take 1 capsule (10 mg total) by mouth 2 (two) times daily as needed for spasms., Disp: 60 capsule, Rfl: 1 .  EPINEPHrine 0.3 mg/0.3 mL IJ SOAJ injection, Inject into the muscle., Disp: , Rfl:  .  ezetimibe (ZETIA) 10 MG tablet, TAKE 1 TABLET (10 MG TOTAL) BY MOUTH DAILY. PLEASE KEEP UPCOMING APPT FOR FUTURE REFILLS. THANK YOU., Disp: 90 tablet, Rfl: 3 .  fluticasone (FLONASE) 50 MCG/ACT nasal spray, SPRAY 2 SPRAYS INTO EACH NOSTRIL EVERY DAY, Disp: 48 mL, Rfl: 1 .  fluticasone furoate-vilanterol (BREO  ELLIPTA) 100-25 MCG/INH AEPB, Inhale 1 puff into the lungs daily., Disp: 180 each, Rfl: 3 .  IRON-VITAMIN C PO, Take by mouth., Disp: , Rfl:  .  lactase (LACTAID) 3000 units tablet, Take by mouth., Disp: , Rfl:  .  metFORMIN (GLUCOPHAGE-XR) 500 MG 24 hr tablet, Take 500 mg by mouth daily with supper., Disp: , Rfl:  .  metoprolol succinate (TOPROL-XL) 50 MG 24 hr tablet, Take 50 mg by mouth daily. , Disp: , Rfl:  .  montelukast (SINGULAIR) 10 MG tablet, TAKE 1 TABLET BY MOUTH EVERYDAY AT BEDTIME, Disp: 90 tablet, Rfl: 2 .  Multiple Vitamin (MULTIVITAMIN) tablet, Take 1 tablet by mouth daily., Disp: , Rfl:  .  Nintedanib (OFEV) 150 MG CAPS, Take 1 capsule (150 mg total) by mouth 2 (two) times daily. (Patient not taking: Reported on 03/06/2020), Disp: 60 capsule, Rfl: 2 .  nitrofurantoin, macrocrystal-monohydrate, (MACROBID) 100 MG capsule, Take 100 mg by mouth 2 (two) times daily., Disp: , Rfl:  .  nitroGLYCERIN (NITROSTAT) 0.4 MG SL tablet, Place 1 tablet (0.4 mg total) under the tongue every 5 (five) minutes as needed for chest pain., Disp: 25 tablet, Rfl: 12 .  nystatin-triamcinolone (MYCOLOG II) cream, nystatin-triamcinolone 100,000 unit/g-0.1 % topical cream, Disp: , Rfl:  .  omeprazole (PRILOSEC) 20 MG capsule, Take 1 capsule (20 mg total) by mouth daily., Disp: 30 capsule, Rfl: 5 .  pravastatin (PRAVACHOL) 40 MG   tablet, Take 40 mg by mouth every evening. , Disp: , Rfl:  .  predniSONE (DELTASONE) 10 MG tablet, TAKE 1 TABLET (10 MG TOTAL) BY MOUTH DAILY WITH BREAKFAST., Disp: 90 tablet, Rfl: 1 .  predniSONE (DELTASONE) 10 MG tablet, Take 4 tabs daily x1week, 3 tabs daily x1week, 2 tabs daily x1week, then continue on 1 tab daily, Disp: 80 tablet, Rfl: 0 .  Probiotic Product (PROBIOTIC ADVANCED PO), Take 2 capsules by mouth daily. , Disp: , Rfl:  .  Turmeric 500 MG CAPS, Take 500 mg by mouth daily. , Disp: , Rfl:  .  Vitamin D, Ergocalciferol, (DRISDOL) 1.25 MG (50000 UNIT) CAPS capsule, Take 50,000  Units by mouth once a week., Disp: , Rfl:   Past Medical History: Past Medical History:  Diagnosis Date  . Allergy   . Arthritis    history spinal stenosis. osteoarthritis right hip  . Asthma   . Cataract   . Coronary artery calcification seen on CAT scan 08/19/2017   >300 on CT scan 08/2017  . DDD (degenerative disc disease), thoracic   . Depression   . DOE (dyspnea on exertion)    a. 04/2010 Lexi MV EF 71%, no ischemia/infarct;    . Fatty liver   . GERD (gastroesophageal reflux disease)   . H/O steroid therapy    Steroid use orally over 4 yrs- for Lung Fibrosis  . Heart palpitations 02/28/2015  . Helicobacter pylori ab+   . Hemorrhoids   . Hiatal hernia   . High cholesterol   . History of chronic bronchitis    as child  . History of migraine   . History of MRSA infection   . Hyperlipidemia, mixed 08/19/2017  . Hyperplastic colon polyp 2007  . IBS (irritable bowel syndrome)   . Inguinal hernia    right  . Insulin resistance    past  . Interstitial lung disease (HCC)   . MVP (mitral valve prolapse)    Posterior mitral valve leaflet with mild MR  . Pneumonia   . Pneumonitis, hypersensitivity (HCC)    a. 09/2012 s/p Bx - ? 2/2 bird, mold, oil paint exposure ->on steroids, followed by pulm.  . PONV (postoperative nausea and vomiting)   . Pre-diabetes    takes Metformin  . Pulmonary fibrosis (HCC)    Dr. Ramaswamy follows- stable at present  . PVC (premature ventricular contraction) 08/19/2017  . Rapid heart rate    Dr. Turner follows- last visit Epic note 9'16  . Squamous cell carcinoma of skin   . Tinnitus, right ear     Tobacco Use: Social History   Tobacco Use  Smoking Status Never Smoker  Smokeless Tobacco Never Used  Tobacco Comment   pt states she experimented in college    Labs: Recent Review Flowsheet Data    Labs for ITP Cardiac and Pulmonary Rehab Latest Ref Rng & Units 02/26/2015 08/21/2015 11/08/2017 12/08/2018 09/28/2019   Cholestrol 100 - 199 mg/dL  184 - 180 177 -   LDLCALC 0 - 99 mg/dL 94 - 77 83 -   HDL >39 mg/dL 53 - 71 68 -   Trlycerides 0 - 149 mg/dL 185(H) - 158(H) 131 -   Hemoglobin A1c 4.8 - 5.6 % - 6.1(H) - 6.0(H) 6.2(H)   PHART 7.350 - 7.450 - - - - -   PCO2ART 35.0 - 45.0 mmHg - - - - -   HCO3 20.0 - 24.0 mEq/L - - - - -   TCO2 0 -   100 mmol/L - - - - -   ACIDBASEDEF 0.0 - 2.0 mmol/L - - - - -   O2SAT % - - - - -      Capillary Blood Glucose: Lab Results  Component Value Date   GLUCAP 209 (H) 10/04/2019   GLUCAP 150 (H) 10/04/2019   GLUCAP 139 (H) 10/03/2019   GLUCAP 112 (H) 10/03/2019   GLUCAP 93 10/03/2019   POCT Glucose    Row Name 03/12/20 1434             POCT Blood Glucose   Pre-Exercise  131 mg/dL       Post-Exercise  167 mg/dL          Pulmonary Assessment Scores: Pulmonary Assessment Scores    Row Name 02/20/20 1510 02/20/20 1531       ADL UCSD   ADL Phase  Entry  (Pended)   Entry      CAT Score   CAT Score  17  (Pended)   --      mMRC Score   mMRC Score  --  2      UCSD: Self-administered rating of dyspnea associated with activities of daily living (ADLs) 6-point scale (0 = "not at all" to 5 = "maximal or unable to do because of breathlessness")  Scoring Scores range from 0 to 120.  Minimally important difference is 5 units  CAT: CAT can identify the health impairment of COPD patients and is better correlated with disease progression.  CAT has a scoring range of zero to 40. The CAT score is classified into four groups of low (less than 10), medium (10 - 20), high (21-30) and very high (31-40) based on the impact level of disease on health status. A CAT score over 10 suggests significant symptoms.  A worsening CAT score could be explained by an exacerbation, poor medication adherence, poor inhaler technique, or progression of COPD or comorbid conditions.  CAT MCID is 2 points  mMRC: mMRC (Modified Medical Research Council) Dyspnea Scale is used to assess the degree of baseline  functional disability in patients of respiratory disease due to dyspnea. No minimal important difference is established. A decrease in score of 1 point or greater is considered a positive change.   Pulmonary Function Assessment:   Exercise Target Goals: Exercise Program Goal: Individual exercise prescription set using results from initial 6 min walk test and THRR while considering  patient's activity barriers and safety.   Exercise Prescription Goal: Initial exercise prescription builds to 30-45 minutes a day of aerobic activity, 2-3 days per week.  Home exercise guidelines will be given to patient during program as part of exercise prescription that the participant will acknowledge.  Activity Barriers & Risk Stratification: Activity Barriers & Cardiac Risk Stratification - 02/20/20 1447      Activity Barriers & Cardiac Risk Stratification   Activity Barriers  Left Hip Replacement;Right Hip Replacement;Deconditioning       6 Minute Walk: 6 Minute Walk    Row Name 02/20/20 1532         6 Minute Walk   Distance  1257 feet     Walk Time  6 minutes     # of Rest Breaks  0     MPH  2.38     METS  2.51     RPE  12     Perceived Dyspnea   2     VO2 Peak  8.79     Symptoms  Yes (comment)  Comments  L hip pain 1/10     Resting HR  75 bpm     Resting BP  114/66     Resting Oxygen Saturation   100 %     Exercise Oxygen Saturation  during 6 min walk  93 %     Max Ex. HR  94 bpm     Max Ex. BP  122/74     2 Minute Post BP  108/70       Interval HR   1 Minute HR  85     2 Minute HR  92     3 Minute HR  94     4 Minute HR  92     5 Minute HR  90     6 Minute HR  82     2 Minute Post HR  71     Interval Heart Rate?  Yes       Interval Oxygen   Interval Oxygen?  Yes     Baseline Oxygen Saturation %  100 %     1 Minute Oxygen Saturation %  99 %     1 Minute Liters of Oxygen  0 L     2 Minute Oxygen Saturation %  94 %     2 Minute Liters of Oxygen  0 L     3 Minute  Oxygen Saturation %  93 %     3 Minute Liters of Oxygen  0 L     4 Minute Oxygen Saturation %  95 %     4 Minute Liters of Oxygen  0 L     5 Minute Oxygen Saturation %  94 %     5 Minute Liters of Oxygen  0 L     6 Minute Oxygen Saturation %  94 %     6 Minute Liters of Oxygen  0 L     2 Minute Post Oxygen Saturation %  98 %     2 Minute Post Liters of Oxygen  0 L        Oxygen Initial Assessment: Oxygen Initial Assessment - 02/20/20 1530      Home Oxygen   Home Oxygen Device  Portable Concentrator    Sleep Oxygen Prescription  None    Home Exercise Oxygen Prescription  None    Home at Rest Exercise Oxygen Prescription  None    Compliance with Home Oxygen Use  Yes      Initial 6 min Walk   Oxygen Used  None      Program Oxygen Prescription   Program Oxygen Prescription  None      Intervention   Short Term Goals  To learn and exhibit compliance with exercise, home and travel O2 prescription;To learn and understand importance of monitoring SPO2 with pulse oximeter and demonstrate accurate use of the pulse oximeter.;To learn and understand importance of maintaining oxygen saturations>88%;To learn and demonstrate proper pursed lip breathing techniques or other breathing techniques.;To learn and demonstrate proper use of respiratory medications    Long  Term Goals  Exhibits compliance with exercise, home and travel O2 prescription;Verbalizes importance of monitoring SPO2 with pulse oximeter and return demonstration;Maintenance of O2 saturations>88%;Exhibits proper breathing techniques, such as pursed lip breathing or other method taught during program session;Compliance with respiratory medication;Demonstrates proper use of MDI's       Oxygen Re-Evaluation: Oxygen Re-Evaluation    Row Name 03/12/20 0810               Program Oxygen Prescription   Program Oxygen Prescription  None         Home Oxygen   Home Oxygen Device  Portable Concentrator       Sleep Oxygen Prescription   None       Home Exercise Oxygen Prescription  None       Liters per minute  3       Home at Rest Exercise Oxygen Prescription  None       Compliance with Home Oxygen Use  Yes         Goals/Expected Outcomes   Short Term Goals  To learn and exhibit compliance with exercise, home and travel O2 prescription;To learn and understand importance of monitoring SPO2 with pulse oximeter and demonstrate accurate use of the pulse oximeter.;To learn and understand importance of maintaining oxygen saturations>88%;To learn and demonstrate proper pursed lip breathing techniques or other breathing techniques.;To learn and demonstrate proper use of respiratory medications       Long  Term Goals  Exhibits compliance with exercise, home and travel O2 prescription;Verbalizes importance of monitoring SPO2 with pulse oximeter and return demonstration;Maintenance of O2 saturations>88%;Exhibits proper breathing techniques, such as pursed lip breathing or other method taught during program session;Compliance with respiratory medication;Demonstrates proper use of MDI's       Goals/Expected Outcomes  compliance          Oxygen Discharge (Final Oxygen Re-Evaluation): Oxygen Re-Evaluation - 03/12/20 0810      Program Oxygen Prescription   Program Oxygen Prescription  None      Home Oxygen   Home Oxygen Device  Portable Concentrator    Sleep Oxygen Prescription  None    Home Exercise Oxygen Prescription  None    Liters per minute  3    Home at Rest Exercise Oxygen Prescription  None    Compliance with Home Oxygen Use  Yes      Goals/Expected Outcomes   Short Term Goals  To learn and exhibit compliance with exercise, home and travel O2 prescription;To learn and understand importance of monitoring SPO2 with pulse oximeter and demonstrate accurate use of the pulse oximeter.;To learn and understand importance of maintaining oxygen saturations>88%;To learn and demonstrate proper pursed lip breathing techniques or other  breathing techniques.;To learn and demonstrate proper use of respiratory medications    Long  Term Goals  Exhibits compliance with exercise, home and travel O2 prescription;Verbalizes importance of monitoring SPO2 with pulse oximeter and return demonstration;Maintenance of O2 saturations>88%;Exhibits proper breathing techniques, such as pursed lip breathing or other method taught during program session;Compliance with respiratory medication;Demonstrates proper use of MDI's    Goals/Expected Outcomes  compliance       Initial Exercise Prescription: Initial Exercise Prescription - 02/20/20 1500      Date of Initial Exercise RX and Referring Provider   Date  02/20/20    Referring Provider  Dr. Chase Caller      Treadmill   MPH  2    Grade  0    Minutes  15      NuStep   Level  3    SPM  80    Minutes  15      Prescription Details   Frequency (times per week)  2    Duration  Progress to 30 minutes of continuous aerobic without signs/symptoms of physical distress      Intensity   THRR 40-80% of Max Heartrate  60-120    Ratings of Perceived Exertion  11-13  Perceived Dyspnea  0-4      Progression   Progression  Continue to progress workloads to maintain intensity without signs/symptoms of physical distress.      Resistance Training   Training Prescription  Yes    Weight  orange bands    Reps  10-15       Perform Capillary Blood Glucose checks as needed.  Exercise Prescription Changes: Exercise Prescription Changes    Row Name 03/12/20 1400 03/12/20 1500           Response to Exercise   Blood Pressure (Admit)  104/60  --      Blood Pressure (Exercise)  110/52  --      Blood Pressure (Exit)  104/64  --      Heart Rate (Admit)  81 bpm  --      Heart Rate (Exercise)  99 bpm  --      Heart Rate (Exit)  82 bpm  --      Oxygen Saturation (Admit)  97 %  --      Oxygen Saturation (Exercise)  88 %  --      Oxygen Saturation (Exit)  95 %  --      Rating of Perceived  Exertion (Exercise)  15  --      Perceived Dyspnea (Exercise)  3  --      Duration  Continue with 30 min of aerobic exercise without signs/symptoms of physical distress.  --      Intensity  Other (comment) 40-80% of HRR  --        Progression   Progression  Continue to progress workloads to maintain intensity without signs/symptoms of physical distress.  --        Resistance Training   Training Prescription  Yes  --      Weight  orange bands  --      Reps  10-15  --      Time  10 Minutes  --        Treadmill   MPH  1.7  --      Grade  0  --      Minutes  15  --        NuStep   Level  3  --      SPM  80  --      Minutes  15  --      METs  2.1  --        Home Exercise Plan   Plans to continue exercise at  --  Home (comment)      Frequency  --  Add 3 additional days to program exercise sessions.      Initial Home Exercises Provided  --  03/12/20         Exercise Comments: Exercise Comments    Row Name 03/12/20 1514           Exercise Comments  home exercise complete          Exercise Goals and Review: Exercise Goals    Row Name 02/20/20 1541 03/12/20 0811           Exercise Goals   Increase Physical Activity  Yes  Yes      Intervention  Provide advice, education, support and counseling about physical activity/exercise needs.;Develop an individualized exercise prescription for aerobic and resistive training based on initial evaluation findings, risk stratification, comorbidities and participant's personal goals.  Provide advice, education, support and counseling about   physical activity/exercise needs.;Develop an individualized exercise prescription for aerobic and resistive training based on initial evaluation findings, risk stratification, comorbidities and participant's personal goals.      Expected Outcomes  Short Term: Attend rehab on a regular basis to increase amount of physical activity.;Long Term: Add in home exercise to make exercise part of routine and to  increase amount of physical activity.;Long Term: Exercising regularly at least 3-5 days a week.  Short Term: Attend rehab on a regular basis to increase amount of physical activity.;Long Term: Add in home exercise to make exercise part of routine and to increase amount of physical activity.;Long Term: Exercising regularly at least 3-5 days a week.      Increase Strength and Stamina  Yes  Yes      Intervention  Provide advice, education, support and counseling about physical activity/exercise needs.;Develop an individualized exercise prescription for aerobic and resistive training based on initial evaluation findings, risk stratification, comorbidities and participant's personal goals.  Provide advice, education, support and counseling about physical activity/exercise needs.;Develop an individualized exercise prescription for aerobic and resistive training based on initial evaluation findings, risk stratification, comorbidities and participant's personal goals.      Expected Outcomes  Short Term: Increase workloads from initial exercise prescription for resistance, speed, and METs.;Short Term: Perform resistance training exercises routinely during rehab and add in resistance training at home;Long Term: Improve cardiorespiratory fitness, muscular endurance and strength as measured by increased METs and functional capacity (6MWT)  Short Term: Increase workloads from initial exercise prescription for resistance, speed, and METs.;Short Term: Perform resistance training exercises routinely during rehab and add in resistance training at home;Long Term: Improve cardiorespiratory fitness, muscular endurance and strength as measured by increased METs and functional capacity (6MWT)      Able to understand and use rate of perceived exertion (RPE) scale  Yes  Yes      Intervention  Provide education and explanation on how to use RPE scale  Provide education and explanation on how to use RPE scale      Expected Outcomes   Short Term: Able to use RPE daily in rehab to express subjective intensity level;Long Term:  Able to use RPE to guide intensity level when exercising independently  Short Term: Able to use RPE daily in rehab to express subjective intensity level;Long Term:  Able to use RPE to guide intensity level when exercising independently      Able to understand and use Dyspnea scale  Yes  Yes      Intervention  Provide education and explanation on how to use Dyspnea scale  Provide education and explanation on how to use Dyspnea scale      Expected Outcomes  Short Term: Able to use Dyspnea scale daily in rehab to express subjective sense of shortness of breath during exertion;Long Term: Able to use Dyspnea scale to guide intensity level when exercising independently  Short Term: Able to use Dyspnea scale daily in rehab to express subjective sense of shortness of breath during exertion;Long Term: Able to use Dyspnea scale to guide intensity level when exercising independently      Knowledge and understanding of Target Heart Rate Range (THRR)  Yes  Yes      Intervention  Provide education and explanation of THRR including how the numbers were predicted and where they are located for reference  Provide education and explanation of THRR including how the numbers were predicted and where they are located for reference      Expected Outcomes  Short Term: Able to state/look up THRR;Long Term: Able to use THRR to govern intensity when exercising independently;Short Term: Able to use daily as guideline for intensity in rehab  Short Term: Able to state/look up THRR;Long Term: Able to use THRR to govern intensity when exercising independently;Short Term: Able to use daily as guideline for intensity in rehab      Understanding of Exercise Prescription  Yes  Yes      Intervention  Provide education, explanation, and written materials on patient's individual exercise prescription  Provide education, explanation, and written materials  on patient's individual exercise prescription      Expected Outcomes  Short Term: Able to explain program exercise prescription;Long Term: Able to explain home exercise prescription to exercise independently  Short Term: Able to explain program exercise prescription;Long Term: Able to explain home exercise prescription to exercise independently         Exercise Goals Re-Evaluation : Exercise Goals Re-Evaluation    Row Name 03/12/20 0811             Exercise Goal Re-Evaluation   Exercise Goals Review  Increase Physical Activity;Increase Strength and Stamina;Able to understand and use rate of perceived exertion (RPE) scale;Able to understand and use Dyspnea scale;Knowledge and understanding of Target Heart Rate Range (THRR);Understanding of Exercise Prescription       Comments  Pt has attended 3 exercise sessions. I expect that she will progress well. She is currently exercising at 2.3 METs on the stepper. Will continue to monitor and progress as able.       Expected Outcomes  Through exercise at rehab and at home, patient will increase endurance and strength. Patient will also be able to perform ADL's with less shortness of breath and fatigue.          Discharge Exercise Prescription (Final Exercise Prescription Changes): Exercise Prescription Changes - 03/12/20 1500      Home Exercise Plan   Plans to continue exercise at  Home (comment)    Frequency  Add 3 additional days to program exercise sessions.    Initial Home Exercises Provided  03/12/20       Nutrition:  Target Goals: Understanding of nutrition guidelines, daily intake of sodium <1500mg, cholesterol <200mg, calories 30% from fat and 7% or less from saturated fats, daily to have 5 or more servings of fruits and vegetables.  Biometrics: Pre Biometrics - 02/20/20 1448      Pre Biometrics   Height  5' (1.524 m)    Weight  146 lb 9.7 oz (66.5 kg)    BMI (Calculated)  28.63    Grip Strength  21.5 kg        Nutrition  Therapy Plan and Nutrition Goals: Nutrition Therapy & Goals - 03/05/20 1421      Nutrition Therapy   Diet  Carb Modified, Mediterranean      Personal Nutrition Goals   Nutrition Goal  Identify food quantities necessary to achieve wt loss of  -1# per week to a goal wt loss of 4-10 lb at graduation from pulmonary rehab.      Intervention Plan   Intervention  Prescribe, educate and counsel regarding individualized specific dietary modifications aiming towards targeted core components such as weight, hypertension, lipid management, diabetes, heart failure and other comorbidities.;Nutrition handout(s) given to patient.    Expected Outcomes  Long Term Goal: Adherence to prescribed nutrition plan.       Nutrition Assessments: Nutrition Assessments - 03/05/20 1013      Rate   Your Plate Scores   Pre Score  68       Nutrition Goals Re-Evaluation: Nutrition Goals Re-Evaluation    Row Name 03/05/20 1424             Goals   Current Weight  146 lb (66.2 kg)       Nutrition Goal  Identify food quantities necessary to achieve wt loss of  -1# per week to a goal wt loss of 4-10 lb at graduation from pulmonary rehab.          Nutrition Goals Discharge (Final Nutrition Goals Re-Evaluation): Nutrition Goals Re-Evaluation - 03/05/20 1424      Goals   Current Weight  146 lb (66.2 kg)    Nutrition Goal  Identify food quantities necessary to achieve wt loss of  -1# per week to a goal wt loss of 4-10 lb at graduation from pulmonary rehab.       Psychosocial: Target Goals: Acknowledge presence or absence of significant depression and/or stress, maximize coping skills, provide positive support system. Participant is able to verbalize types and ability to use techniques and skills needed for reducing stress and depression.  Initial Review & Psychosocial Screening: Initial Psych Review & Screening - 02/20/20 1453      Initial Review   Current issues with  None Identified      Family  Dynamics   Good Support System?  Yes      Barriers   Psychosocial barriers to participate in program  There are no identifiable barriers or psychosocial needs.      Screening Interventions   Interventions  Encouraged to exercise       Quality of Life Scores:  Scores of 19 and below usually indicate a poorer quality of life in these areas.  A difference of  2-3 points is a clinically meaningful difference.  A difference of 2-3 points in the total score of the Quality of Life Index has been associated with significant improvement in overall quality of life, self-image, physical symptoms, and general health in studies assessing change in quality of life.  PHQ-9: Recent Review Flowsheet Data    Depression screen PHQ 2/9 02/20/2020 09/03/2017 08/21/2015   Decreased Interest 0 0 0   Down, Depressed, Hopeless 0 0 0   PHQ - 2 Score 0 0 0   Altered sleeping 0 - -   Tired, decreased energy 0 - -   Change in appetite 0 - -   Feeling bad or failure about yourself  0 - -   Trouble concentrating 0 - -   Moving slowly or fidgety/restless 0 - -   Suicidal thoughts 0 - -   PHQ-9 Score 0 - -   Difficult doing work/chores Not difficult at all - -     Interpretation of Total Score  Total Score Depression Severity:  1-4 = Minimal depression, 5-9 = Mild depression, 10-14 = Moderate depression, 15-19 = Moderately severe depression, 20-27 = Severe depression   Psychosocial Evaluation and Intervention: Psychosocial Evaluation - 02/20/20 1453      Psychosocial Evaluation & Interventions   Interventions  Encouraged to exercise with the program and follow exercise prescription    Continue Psychosocial Services   No Follow up required       Psychosocial Re-Evaluation: Psychosocial Re-Evaluation    Row Name 03/12/20 0916             Psychosocial Re-Evaluation   Current issues with  None Identified         Comments  No barriers or concerns identified.       Expected Outcomes  For [name] to be free  of psychosocial concerns while participating in pulmonary rehab.       Interventions  Encouraged to attend Pulmonary Rehabilitation for the exercise       Continue Psychosocial Services   No Follow up required          Psychosocial Discharge (Final Psychosocial Re-Evaluation): Psychosocial Re-Evaluation - 03/12/20 0916      Psychosocial Re-Evaluation   Current issues with  None Identified    Comments  No barriers or concerns identified.    Expected Outcomes  For [name] to be free of psychosocial concerns while participating in pulmonary rehab.    Interventions  Encouraged to attend Pulmonary Rehabilitation for the exercise    Continue Psychosocial Services   No Follow up required       Education: Education Goals: Education classes will be provided on a weekly basis, covering required topics. Participant will state understanding/return demonstration of topics presented.  Learning Barriers/Preferences: Learning Barriers/Preferences - 02/20/20 1454      Learning Barriers/Preferences   Learning Barriers  None    Learning Preferences  Individual Instruction;Skilled Demonstration;Verbal Instruction       Education Topics: Risk Factor Reduction:  -Group instruction that is supported by a PowerPoint presentation. Instructor discusses the definition of a risk factor, different risk factors for pulmonary disease, and how the heart and lungs work together.     PULMONARY REHAB OTHER RESPIRATORY from 02/29/2020 in [facility]  Date  02/29/20  Educator  DF  Instruction Review Code  2- Demonstrated Understanding      Nutrition for Pulmonary Patient:  -Group instruction provided by PowerPoint slides, verbal discussion, and written materials to support subject matter. The instructor gives an explanation and review of healthy diet recommendations, which includes a discussion on weight management, recommendations for fruit and vegetable consumption, as well as protein,  fluid, caffeine, fiber, sodium, sugar, and alcohol. Tips for eating when patients are short of breath are discussed.   Pursed Lip Breathing:  -Group instruction that is supported by demonstration and informational handouts. Instructor discusses the benefits of pursed lip and diaphragmatic breathing and detailed demonstration on how to preform both.     PULMONARY REHAB OTHER RESPIRATORY from 01/13/2018 in [facility]  Date  12/09/17  Educator  pharm D      Oxygen Safety:  -Group instruction provided by PowerPoint, verbal discussion, and written material to support subject matter. There is an overview of "What is Oxygen" and "Why do we need it".  Instructor also reviews how to create a safe environment for oxygen use, the importance of using oxygen as prescribed, and the risks of noncompliance. There is a brief discussion on traveling with oxygen and resources the patient may utilize.   PULMONARY REHAB OTHER RESPIRATORY from 01/13/2018 in [facility]  Date  12/30/17  Educator  RN  Instruction Review Code  2- Demonstrated Understanding      Oxygen Equipment:  -Group instruction provided by Home Health Staff utilizing handouts, written materials, and equipment demonstrations.   PULMONARY REHAB OTHER RESPIRATORY from 01/13/2018 in [facility]  Date  01/06/18  Educator  [employer]      Signs and Symptoms:  -Group instruction provided by written material and verbal discussion to support subject matter. Warning signs and symptoms of infection, stroke, and heart   attack are reviewed and when to call the physician/911 reinforced. Tips for preventing the spread of infection discussed.   Advanced Directives:  -Group instruction provided by verbal instruction and written material to support subject matter. Instructor reviews Advanced Directive laws and proper instruction for filling out document.   Pulmonary  Video:  -Group video education that reviews the importance of medication and oxygen compliance, exercise, good nutrition, pulmonary hygiene, and pursed lip and diaphragmatic breathing for the pulmonary patient.   Exercise for the Pulmonary Patient:  -Group instruction that is supported by a PowerPoint presentation. Instructor discusses benefits of exercise, core components of exercise, frequency, duration, and intensity of an exercise routine, importance of utilizing pulse oximetry during exercise, safety while exercising, and options of places to exercise outside of rehab.     PULMONARY REHAB OTHER RESPIRATORY from 01/13/2018 in [facility]  Date  01/13/18  Educator  EP  Instruction Review Code  1- Verbalizes Understanding      Pulmonary Medications:  -Verbally interactive group education provided by instructor with focus on inhaled medications and proper administration.   Anatomy and Physiology of the Respiratory System and Intimacy:  -Group instruction provided by PowerPoint, verbal discussion, and written material to support subject matter. Instructor reviews respiratory cycle and anatomical components of the respiratory system and their functions. Instructor also reviews differences in obstructive and restrictive respiratory diseases with examples of each. Intimacy, Sex, and Sexuality differences are reviewed with a discussion on how relationships can change when diagnosed with pulmonary disease. Common sexual concerns are reviewed.   PULMONARY REHAB OTHER RESPIRATORY from 01/13/2018 in [facility]  Date  11/11/17  Educator  rn      MD DAY -A group question and answer session with a medical doctor that allows participants to ask questions that relate to their pulmonary disease state.   PULMONARY REHAB OTHER RESPIRATORY from 01/13/2018 in [facility]  Date  12/02/17  Educator  Dr. Yacoub       OTHER EDUCATION -Group or individual verbal, written, or video instructions that support the educational goals of the pulmonary rehab program.   PULMONARY REHAB OTHER RESPIRATORY from 01/13/2018 in [facility]  Date  12/16/17 [Holiday Eating]  Educator  Molly  Instruction Review Code  1- Verbalizes Understanding [sedentary lifestyles]      Holiday Eating Survival Tips:  -Group instruction provided by PowerPoint slides, verbal discussion, and written materials to support subject matter. The instructor gives patients tips, tricks, and techniques to help them not only survive but enjoy the holidays despite the onslaught of food that accompanies the holidays.   Knowledge Questionnaire Score: Knowledge Questionnaire Score - 02/20/20 1510      Knowledge Questionnaire Score   Pre Score  18/18       Core Components/Risk Factors/Patient Goals at Admission: Personal Goals and Risk Factors at Admission - 02/20/20 1455      Core Components/Risk Factors/Patient Goals on Admission   Improve shortness of breath with ADL's  Yes    Intervention  Provide education, individualized exercise plan and daily activity instruction to help decrease symptoms of SOB with activities of daily living.    Expected Outcomes  Short Term: Improve cardiorespiratory fitness to achieve a reduction of symptoms when performing ADLs;Long Term: Be able to perform more ADLs without symptoms or delay the onset of symptoms       Core Components/Risk Factors/Patient Goals Review:  Goals   and Risk Factor Review    Row Name 02/20/20 1456 03/12/20 0917           Core Components/Risk Factors/Patient Goals Review   Personal Goals Review  Develop more efficient breathing techniques such as purse lipped breathing and diaphragmatic breathing and practicing self-pacing with activity.;Increase knowledge of respiratory medications and ability to use respiratory devices properly.;Improve shortness of  breath with ADL's  Develop more efficient breathing techniques such as purse lipped breathing and diaphragmatic breathing and practicing self-pacing with activity.;Increase knowledge of respiratory medications and ability to use respiratory devices properly.;Improve shortness of breath with ADL's      Review  --  She is well known to pulmonary rehab, third time in program, progressing well, does not require supplemental oxygen, treadmill @ 1.7 mph/0 incline, level 3 on nustep.      Expected Outcomes  --  See admission goals.         Core Components/Risk Factors/Patient Goals at Discharge (Final Review):  Goals and Risk Factor Review - 03/12/20 0917      Core Components/Risk Factors/Patient Goals Review   Personal Goals Review  Develop more efficient breathing techniques such as purse lipped breathing and diaphragmatic breathing and practicing self-pacing with activity.;Increase knowledge of respiratory medications and ability to use respiratory devices properly.;Improve shortness of breath with ADL's    Review  She is well known to pulmonary rehab, third time in program, progressing well, does not require supplemental oxygen, treadmill @ 1.7 mph/0 incline, level 3 on nustep.    Expected Outcomes  See admission goals.       ITP Comments:   Comments: ITP REVIEW Pt is making expected progress toward pulmonary rehab goals after completing 4 sessions. Recommend continued exercise, life style modification, education, and utilization of breathing techniques to increase stamina and strength and decrease shortness of breath with exertion.

## 2020-03-14 ENCOUNTER — Encounter (HOSPITAL_COMMUNITY)
Admission: RE | Admit: 2020-03-14 | Discharge: 2020-03-14 | Disposition: A | Discharge status: Home / Self Care | Payer: Medicare Other | Source: Ambulatory Visit | Attending: Internal Medicine | Admitting: Internal Medicine

## 2020-03-14 ENCOUNTER — Other Ambulatory Visit: Payer: Self-pay

## 2020-03-14 DIAGNOSIS — J849 Interstitial pulmonary disease, unspecified: Secondary | ICD-10-CM

## 2020-03-14 NOTE — Progress Notes (Signed)
Daily Session Note  Patient Details  Name: Rhonda [name] MRN: [id number] Date of Birth: 08/01/1951 Referring Provider:     Pulmonary Rehab Walk Test from 02/20/2020 in [place]  Referring Provider  Dr. Chase Caller      Encounter Date: 03/14/2020  Check In: Session Check In - 03/14/20 1436      Check-In   Supervising physician immediately available to respond to emergencies  [facility] immediately available    Physician(s)  Dr. Dessa Phi    Location  [facility]    Staff Present  Rosebud Poles, RN, Bjorn Loser, MS, Exercise Physiologist;Otniel Hoe Ysidro Evert, RN    Virtual Visit  No    Medication changes reported      No    Fall or balance concerns reported     No    Tobacco Cessation  No Change    Warm-up and Cool-down  Performed as group-led instruction    Resistance Training Performed  Yes    VAD Patient?  No    PAD/SET Patient?  No      Pain Assessment   Currently in Pain?  No/denies    Multiple Pain Sites  No       Capillary Blood Glucose: No results found for this or any previous visit (from the past 24 hour(s)).    Social History   Tobacco Use  Smoking Status Never Smoker  Smokeless Tobacco Never Used  Tobacco Comment   pt states she experimented in college    Goals Met:  Exercise tolerated well No report of cardiac concerns or symptoms Strength training completed today  Goals Unmet:  Not Applicable  Comments: Service time is from 1245 to 1356    Dr. Fransico Him is Medical Director for Cardiac Rehab at [facility].

## 2020-03-14 NOTE — Telephone Encounter (Signed)
Spoke with pt and advised ONO was sent to scan and once it has been scanned [facility] should be able to see in Epic. Nothing further needed.

## 2020-03-19 ENCOUNTER — Encounter (HOSPITAL_COMMUNITY)
Admission: RE | Admit: 2020-03-19 | Discharge: 2020-03-19 | Disposition: A | Discharge status: Home / Self Care | Payer: Medicare Other | Source: Ambulatory Visit | Attending: Internal Medicine | Admitting: Internal Medicine

## 2020-03-19 ENCOUNTER — Encounter: Payer: Self-pay | Admitting: Internal Medicine

## 2020-03-19 ENCOUNTER — Ambulatory Visit (INDEPENDENT_AMBULATORY_CARE_PROVIDER_SITE_OTHER): Payer: Medicare Other | Admitting: Internal Medicine

## 2020-03-19 ENCOUNTER — Other Ambulatory Visit: Payer: Self-pay

## 2020-03-19 VITALS — BP 116/56 | HR 74 | Temp 97.0°F | Ht 60.0 in | Wt 142.0 lb

## 2020-03-19 DIAGNOSIS — R0789 Other chest pain: Secondary | ICD-10-CM

## 2020-03-19 DIAGNOSIS — J849 Interstitial pulmonary disease, unspecified: Secondary | ICD-10-CM | POA: Diagnosis not present

## 2020-03-19 DIAGNOSIS — R931 Abnormal findings on diagnostic imaging of heart and coronary circulation: Secondary | ICD-10-CM

## 2020-03-19 DIAGNOSIS — J679 Hypersensitivity pneumonitis due to unspecified organic dust: Secondary | ICD-10-CM

## 2020-03-19 NOTE — Addendum Note (Signed)
Addended by: Lorretta Harp on: 03/19/2020 09:44 AM   Modules accepted: Orders

## 2020-03-19 NOTE — Patient Instructions (Addendum)
ICD-10-CM   1. ILD (interstitial lung disease) ([place])  J84.9   2. 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 oil pain and ? mold in house  J67.9   3. Right-sided chest wall pain  R07.89     Glad pain and dyspnea all better with higher prednisone  - means pain from fibropsis possibly Too bad ofev did not work for you - abdominal pain and fever Glad you got rid of feather jackets Glad you only took macrobid for 1 day Glad that with rehab  You are not dropping oxygen  Plan  - ok to dc oxygen but keep the portable one just in case if you want  - continue prednisone 10mg  per day  - list ofev and macrobid as allergies - cancel 04/16/20 breathing test  - keep pulmonix research appt 04/16/20 - - continue rehab  Followup  = spirometry and dlco end of august 2021/early sept 2021  - 30 min face to face with Dr Chase Caller in [place]inder 2021/early sept 2021

## 2020-03-19 NOTE — Progress Notes (Signed)
Daily Session Note  Patient Details  Name: Rhonda [name] MRN: [id number] Date of Birth: 02-24-1951 Referring Provider:     Pulmonary Rehab Walk Test from 02/20/2020 in [place]  Referring Provider  Dr. Chase Caller      Encounter Date: 03/19/2020  Check In: Session Check In - 03/19/20 1455      Check-In   Supervising physician immediately available to respond to emergencies  [facility] immediately available    Physician(s)  Dr. Maylene Roes    Location  [facility]    Staff Present  Rosebud Poles, RN, Bjorn Loser, MS, Exercise Physiologist;Achilles Neville Ysidro Evert, RN    Virtual Visit  No    Medication changes reported      No    Fall or balance concerns reported     No    Tobacco Cessation  No Change    Warm-up and Cool-down  Performed on first and last piece of equipment    Resistance Training Performed  Yes    VAD Patient?  No    PAD/SET Patient?  No      Pain Assessment   Currently in Pain?  No/denies    Multiple Pain Sites  No       Capillary Blood Glucose: No results found for this or any previous visit (from the past 24 hour(s)).    Social History   Tobacco Use  Smoking Status Never Smoker  Smokeless Tobacco Never Used  Tobacco Comment   pt states she experimented in college    Goals Met:  Exercise tolerated well No report of cardiac concerns or symptoms Strength training completed today  Goals Unmet:  Not Applicable  Comments: Service time is from 1245 to 1345    Dr. Fransico Him is Medical Director for Cardiac Rehab at [facility].

## 2020-03-19 NOTE — Progress Notes (Signed)
#GE reflux with small hiatal hernia  - on ppi   #History of rapid heart rate not otherwise specified  - Start her on Lopressor  2008/2009 by primary care physician. History appears to correlate with onset of respiratory issues  - refuses to dc this drug as of 2014 discussion   # Hypersensitivity Pneumonitis and ILD  - Potential etiologies - cockateil x 2 for 18 years through end 2012. 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 Denies mold but lives in house built in [place] with a "weird baselment: and has humidifier  - first noted on CT chest 10/15/09 following trip to [place] (PE negative) but not described in 2003 CT chest report  - autoimmune panel: 10/23/11: Negative  -  Uderwennt bronch 11/19/11  - non diagnostic  - VATS Nov 2013 (done after initially refusing)- [place]. However, there is worrying trend of UIP pattern in the Upper lobes.            Oct/Nov 2012  March 2013 08/11/12  Nov/Dec 2013 Dx HP/UIP 01/02/2013  02/28/13 05/25/2013  12/06/2013  03/15/2014  06/29/2014  10'27/15 12/19/2014  02/26/15 01/13/2016  04/29/2016  10/23/2016  01/25/2017  06/17/2017 [facility] July 2018 01/11/2018  04/13/2018  08/16/2018   Symp/Signs Dyspnea x5y  New crackles  dimished dyspnea except at hill. Improved crackles.  Improved dyspnea except @ hill                 FVC  2.6 L    2.2 L/82%   2.1 L/80%   2.19 L/82%  2.2L/91% 2.1/81% 1.86/69% 1.9L/71% 1.86/75% 1.75L/65% 1.82/73% (dec 2015 offce spiro) 2.84L/72% 1.54/61% 1.78L/67%  1.69L/65%  1.61/62% 1.35/55% 1.65/67%  DLCO  10/50%    10/51%   9.6/51%     11.9/63% 11.7/62%  11.561%  5.5/31% 10.06/57% 11/04/63%  10.43/55% 8.56/50% 10.57/61% 10.16/57% 9.21/52%  Walk test 185 ft x 3 laps on RA  rest 92%. Pk exertion 90%. Pk HR 108 Rest 92%. Pk exertion 86% at 2.5 laps. Pk HR 102  pk exertion - 88% Rest 96%. Pk 95%. Pk HR is 75. Done at full dose  lopressor Rest 94%. Pk 91%. Rest HR 93 with pk HR 103/min. Done at half dose lopressor REst 96%. Pk pulse ox 92%. Rest HR 65. Pk HR 86 Did not desat. Normal Pk HR  Pulse ox 96%, droped to89% at 2nd lap but bounced up and ended at 91% 3rd lap Did not desat Did not desat, Pk HHR 93  ;llowest pulse ox 90% at 2nd lap but she held her breath, then 92% at 3rd lap 99% -> 91% at end of 3rd lap 94%, Dropped to 87% at 3rd lap, HR peak102/m 97%, dropped to 89% in 3rd lap - asymptomatic. HR 100/min No prob with 38md at [facility] 99% rest - -> dopped to 92% 3rd lap. Pkr HR 107/min 98% rest -> 91% 3rd lap with Pk HR 91/min   CT chest   unchanged          yes Ct mid dec possibly worse - unsure Ct - similar to April 2016 but worse since 2013   CT 06/24/17 - similar to June 2017     Tests/Bx Bronch 0- non diagnostic   VATS - HP with UIP in Upper lobe                   RX  Rx pred start 11/23/12 On  pred 51m per day and will reduce to 250mOn Pred 2050mer day On pred 67m45mr day On pred 5mg 2m day Rx pred 5mg d98my She reduced to pred 5mg x 66mt 1 month QOD due to GI concern side effects On pred 5mg qod7mt will increase to 5/d after this visit On pred 5mg dail19m On pred 5mg daily52mncrease pred to 67mg per d48meduce to pred 7.5 due to skin sissues  pred burs and reduce to 67mg per da32m                         OV 06/17/2017  Chief Complaint  Patient presents with  . Follow-up    [name]. Pt denies significant cough, denies CP/tightness, f/c/s.     She is better. Feels she does not need o2. Has been to [facility] for transplant clinic; felt too early. Seems higher dose prednisone helping but then she is also bettter t his time of the year. In May 2018 I was at [facility] and curbsided national thought leaders - feel that we could explore silent active GERD as a possibility of ILD getting wors with time.   OV 01/11/2018  Chief Complaint  Patient presents with  . Follow-up    PFT done today.  Pt  states she has been coughing x2 weeks since she returned from a trip to [place]. FroDelawareing, pt has had pain in ribs. Pt states she has mild SOB which is stable due to rehab.  Pt states that Dr. Richter is wDarron Doomto know if pt could possibly have eosinophilic esophagitis.   Went to [place]. RetDelawarend has been coughing x 2 weeks. Associated with this is some rib pain on right side. She feels she does not need o2. She feels she does not need pred/abx at this time. She also exposed to flu wit family   OV 02/08/2018  Chief Complaint  Patient presents with  . Acute Visit    CXR done 02/07/18.  Pt states her throat is sore but not as bad as it was yesterday when in office and does have some left ear pain. Pt states she has some pain on right rib cage and has increased fatigue.     Rhonda [name] preKroghutely.  I last saw her one month ago gave her Tamiflu for prophylaxis after flu exposure.  At that point in time she was already reducing her chronic stable prednisone of 10 mg for interstitial lung disease/hypersensitivity pneumonitis.  She had tapered herself slowly 3 or 4 weeks ago to 5 mg.  She says she was doing fairly okay but in the last 2 or 3 weeks she has had increased fatigue.  She says she goes daily swimming and then feels energized but then an hour later starts having fatigue which is more than usual.  Also in the daytime she has random fatigue.  In terms of her respiratory symptoms these are stable and in fact slightly better in terms of shortness of breath and cough but she is having sore throat without any fever and some left-sided otalgia.  In addition she is having right-sided chest pains that are more than usual.  The chest pains have been reported before.  These are specifically at localized spots along the previous incision several years ago for interstitial lung disease.  They are tender as well  to touch.  This pain is worse.  In addition she is also being bothered by arthralgia  in her hand and left hip.  The left hip arthralgias old with a hand arthralgias new.  She is worried about pneumothorax recurrence and so she had a chest x-ray yesterday that shows no pneumothorax.  She has stable interstitial lung disease changes but in my personal visualization these interstitial lung disease changes are worse compared to several years ago.  We do know that she has slowly worsening interstitial lung disease.  In terms of a walking desaturation test this shows stability since last visit.  She has seen GI for acid reflux and has pH probe study coming up  In talking to her she tells me she has been on metformin for over a month or 2.  This is new and is meant for pre-diabetes.  Apparently her hemoglobin A1c is always below 6 but is above 5.5.  Walking desaturation test on 02/08/2018 185 feet x 3 laps on ROOM AIR:  did walk normal pace with forehead probe desaturate. Rest pulse ox was 99%, final pulse ox was 93%. HR response 72/min at rest to 93/min at peak exertion. [name]  Did not Desaturate < 88% . Rhonda [name] yes did  Desaturated </= 3% points. Rhonda [name] yes did get tachyardic   Dg Chest 2 View  Result Date: 02/07/2018 CLINICAL DATA:  Chest wall pain for 2 weeks EXAM: CHEST - 2 VIEW COMPARISON:  CT 08/20/2017, radiograph 10/11/2015 FINDINGS: Coarse interstitial pattern consistent with pulmonary fibrosis, similar distribution compared to prior. No focal pulmonary opacity or pleural effusion. Stable cardiomediastinal silhouette with aortic atherosclerosis. No pneumothorax. Degenerative changes of the spine. IMPRESSION: No active cardiopulmonary disease. Similar appearance of diffuse pulmonary fibrosis. Negative for a pneumothorax. Electronically Signed   By: Donavan Foil M.D.   On: 02/07/2018 14:26    OV 04/13/2018  Chief Complaint  Patient presents with  . Follow-up    Pt had pre spiro and DLCO PFT prior to OV. Pt has increase of productive  cough-thick white in last week. Pt has some wheezing, and allergy issues.   Ms. Freda Munro presents for routine follow-up.  I saw her acutely just approximately 2 months ago.  Then end of April 2019 she saw a nurse practitioner again acutely and was given antibiotic and prednisone.  She says she felt better after that but in the last week or so due to increased pollen load in the community and also moving furniture because her daughter is relocating out of her home she started having more cough.  There is no change in her dyspnea with the cough is really bad.  This is despite Singulair and Chlor-Trimeton.  Those things to help but just take the edge off.  She feels that the pollen making the cough worse.  There is no change in dyspnea.  Walking desaturation test documented below his baseline.  She had pulmonary function test today but the [facility] shows significant decline compared to February 2019 and she feels surprised by this.  DLCO is baseline unchanged and a walking desaturation test limited in the office is also unchanged.  She does not have any fever but has significant postnasal drip.  She has not followed up at [facility] transplant clinic or the allergist recently.  OV 08/16/2018  Subjective:  Patient ID: Rhonda [name], female , DOB: 1951-07-22 , age 33 y.o. , MRN: [id number] , ADDRESS: [address]  [place] [address]   08/16/2018 -   Chief Complaint  Patient presents with  . Follow-up    PFT performed today.  Pt states she has had a lot of mucus the month of September 2019 so she has been taking the Chlor-Trimeton. States other than that she has been able to do a lot more activities and has been swimming a lot more. Pt states she believes the SOB is stable.     HPI Rhonda [name] 69 y.o. -continues to do well.  She is taking higher dose of prednisone 10 mg/day.  Also the summer usually she is doing well.  She is not having much of a cough.  Lung function test shows improvement  compared to tests done earlier this year.  In fact she is almost as good as much 2018.  Still compared to 2014-2016 she has progressive lung disease.  Walking desaturation test is stable.  She needs a high-dose flu shot but we do not have stock today.  She has lost some weight intentionally with better dietary control.  She is exercising regularly swimming.  We discussed ofev  potential future therapy if the drug is approved for this indication.  We will know study results in a year or so.  She is open to the idea but is worried somewhat about her irritable bowel syndrome flaring up.  She is no longer following at the [facility] lung transplant clinic given stability lung function  Other issues: She always has right-sided chest wall pain to the site of lung biopsy.  The only way this is been resolved as by her limiting her use of bra .  Also her irritable bowel syndrome is under control but in case it flares up she wants a refill of dicyclomine     OV 12/15/2018  Subjective:  Patient ID: Rhonda [name], female , DOB: February 17, 1951 , age 14 y.o. , MRN: [id number] , AD[address]ESS: [address] [address]   12/15/2018 -   Chief Complaint  Patient presents with  . Follow-up    Pt states she has been doing better since last visit with TP but states she has been having pain in her rib cage x2 years but states it has become more intense.     HPI Rhonda [name] 69 y.o. -returns for follow-up of her ILD due to hypersensitivity pneumonitis.  She did a clinic visit also for research with the ILD-pro registry study.  Since I last saw her in September 2019 she had a flareup and required higher doses of prednisone.  Since then she is returned to baseline of 5 mg prednisone and she feels good.  She is stable.  Walking desaturation test shows stability.  We have been discussing over the last few months about taking nintedanib based on new data and progressive non--IPF ILD's.  She has trepidation for  this because of irritable bowel syndrome.  After much discussion she is willing to try 100 mg once daily for a month and then escalate to 100 mg twice daily which is the therapeutic dose.  She will do this once after insurance approval which we suspect will happen over the next few months.  Her main issue is that her right sided infra-axillary chest pain is getting worse.  This started after her surgical lung biopsy and pneumothorax on the right side.  It is random and happens with twisting.  But now days it happens once every few days.  Previously it used to happen  once every few weeks.  Sometimes it is excruciating but then she is left in pain for a few days.  Applying Abrol sudden twisting of her body makes it worse.  Is a lancinating pain.  She did try gabapentin some years ago for chronic cough .  She did tolerate the gabapentin well.  She does not remember if it helped the pain or not but clearly back and the pain was much less intense.  She is willing to try this again.  I recommended a CT scan of the chest but she is going to have radiation for a CT coronary angiogram because of high levels of coronary artery calcification.  Therefore written to the radiologist to see if he can look at the lung parenchyma with a CT angiogram    OV 05/09/2019  Subjective:  Patient ID: Rhonda [name], female , DOB: 1951-09-09 , age 37 y.o. , MRN: [id number] , ADDRESS: [address] [place] [address]   05/09/2019 -   Chief Complaint  Patient presents with  . ILD (Interstitial Lung Disease)     HPI Rhonda [name] 69 y.o. -follow-up for chronic care physician pneumonitis on daily 5 mg prednisone.  She has problems with right-sided chest wall pain that is neuropathic.  She tells me since her last visit January 2020 she has been isolating and social distancing because of the pandemic.  Overall she is been stable.  Her symptom scores are mild and documented below.  She continues with prednisone.  She was  supposed to have pulmonary function test but this was held because of the pandemic.  Her walking desaturation test shows she is stable.  She is supposed to have pulmonary function test tomorrow but wanted to see me today.  In the interim she did have a cardiac CT scan of the chest that shows 94th percentile of coronary calcium.  She could not get a CT angiogram done because of PVC.  A nuclear medicine stress test was done and I reviewed this result and it was normal.  A Holter test is pending.  She is kind of nervous because of the ongoing COVID-19 situation and safety of doing a Holter test.  In terms of her right chest wall pain she is adapted to it.  She does not use any bra  OV 08/22/2019  Subjective:  Patient ID: Rhonda [name], female , DOB: 05-05-1951 , age 20 y.o. , MRN: [id number] , [address]ESS: [address] [address]   08/22/2019 -   Chief Complaint  Patient presents with  . Follow-up    needs sx clearance for L hip replacement- not yet scheduled, pending clearance.     Patient lung disease chronic hypersensitive pneumonitis on chronic daily prednisone 5 mg/day  HPI Rhonda [name] 69 y.o. -presents for follow-up of her interstitial lung disease due to chronic hypersensitive pneumonitis.  After last visit in June 2020 given her PFT stability and also her aversion to the potential side effects with nintedanib she decided to just continue with prednisone daily 5 mg/day.  Given the pandemic she is walking 1 mile a day without stopping.  Her interstitial lung disease symptom score is actually slightly better.  Overall she reports stability in her interstitial lung disease.  She has a new issue of getting preoperative clearance for her left hip.  She says the left hip is bone-on-bone and she does not want to take Aleve.  She prefers to have surgery.  Dr. Paralee Cancel is  the surgeon.  Surgery date has not been set.  She is very functional.  She has normal renal function.  Normal  nutritional status.  No anemia.  No respiratory infections in the last 1 month.  She is not on oxygen.  Age 1.  The surgeries in the left hip and the duration of surgery is under 3 hours.  Anesthesia will be general.      OV 01/30/2020  Subjective:  Patient ID: Rhonda [name], female , DOB: September 04, 1951 , age 18 y.o. , MRN: [id number] , [address]ESS: [address] [address] Patient lung disease chronic hypersensitive pneumonitis on chronic daily prednisone 5 mg/day  01/30/2020 -   Chief Complaint  Patient presents with  . Follow-up    PFT performed 3/8.  Pt states she believes she has been going downhill for the past 2 months. Pt was started on O2 at last OV and wears it prn. Pt does have an occ cough.     HPI Rhonda [name] 69 y.o. -presents for follow-up of interstitial lung disease secondary to chronic hypersensitive pneumonitis.  She maintains herself on prednisone 5 mg/day.  In the interim she has had hip surgery and this did wonders for her.  She is able to walk longer distances.  However she does notice that her dyspnea has declined.  She says since January 2021 his symptoms have declined particularly with cough.  Usually in the winter she goes through a cycle of severe cough and exacerbation that requires higher levels of prednisone.  She has been social distancing well and masking well.  She states there is absolutely no mold or water any antigen exposure at home.  But she feels it is her allergies acting up and making her more symptomatic.  She has had a Covid vaccine and is wondering about either going back to swimming or starting pulmonary rehabilitation.  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  In fact when we walked her today with the mask she did drop down to 89%.  This is a decline for her.  She had pulmonary function test and this shows significant decline in FVC and DLCO.  In  review of this that.  Where she has declined this much but is always bounce back with steroids.  At last visit we discussed nintedanib for her as a way to prevent progression of her ILD but given her concerns of GI side effects and the presence of irritable bowel syndrome she is declined.  Infective have this conversation with her including her joining the [employer] at [facility] but she has always been worried about the GI side effects with nintedanib.  This time she is more open to it because of the decline in lung function.  She wants to see an allergist  She also told me that she continues to have a restrictive chest pain particularly on the right side lower area.  She no longer wears a brassiere it has been nearly 3 years since she had a high-resolution CT chest.  She is open to having one now.  Last liver function test was normal in January 2020.  Last renal function was normal in November 2020.      OV 03/19/2020  Subjective:  Patient ID: Rhonda [name], female , DOB: February 01, 1951 , age 43 y.o. , MRN: [id number] , ADDRESS: [address] [place] [address]  Chronic  hypersensitive pneumonitis interstitial lung disease on prednisone daily 10 mg 03/19/2020 -   Chief Complaint  Patient presents with  . Follow-up    Pt states she is doing better since last visit and states her breathing has also improved.     HPI Rhonda [name] 69 y.o. -at last visit in March 2021 there was decline in lung function.  Therefore we started nintedanib.  She took 1 tablet of 150 mg and then had immediate abdominal pain and fever.  Therefore she is decided against taking this.  I also support this decision because I do not think she will tolerate nintedanib.  We did extensive interstitial lung disease question a history again at that time.  She had taken 1 dose of nitrofurantoin.  Have indicated to her that she should never take this medicine again.  In addition discovered that she might have some feather jackets  at home she has now gotten rid of it.  She is now doing pulmonary rehabilitation.  Her overnight oxygen desaturation test is negative.  At pulmonary rehabilitation she tells me she does not drop below 91% and 1 time she went to 89%.  Otherwise overall doing great.  Her cardiologist is increased her Lopressor.  Her daughter is getting married and she is busy with the wedding planning this is being emotionally stressful for her.  Her allergist has increased her recommendations and this is also helping her.  Her allergist is retiring and she wanted recommendations for new allergist.  I have recommended Dr. Fredderick Phenix and Dr. Remus Blake.  Incidentally that right-sided chest pain that she had is also better with increased prednisone of 10 mg/day.  She feels prednisone is working really well for her.   She has upcoming research visit in May 2021 for ILD-pro registry  SYMPTOM SCALE - ILD Sept 2020 01/30/2020   O2 use  o2 with ex  Shortness of Breath  0 -> 5 scale with 5 being worst (score 6 If unable to do)  At rest 0 0  Simple tasks - showers, clothes change, eating, shaving 01 1  Household (dishes, doing bed, laundry) 0 3  Shopping 1 1  Walking level at own pace 1 2  Walking up Stairs 2 3  Total (30-36) Dyspnea Score 5 10  How bad is your cough? 1 3  How bad is your fatigue 1 2  How bad is nausea  0  How bad is vomiting?   0  How bad is diarrhea?  00  How bad is anxiety?  0  How bad is depression  0    Results for Rhonda [name], Rhonda [name] (MRN [id number]) as of 08/22/2019 11:56  Ref. Range 12/06/2013 13:03 03/15/2014 15:56 09/18/2014 11:41 02/26/2015 09:02 12/16/2015 16:31 04/29/2016 12:44 01/25/2017 10:16 01/11/2018 15:19 04/13/2018 08:47 08/16/2018 09:00 05/10/2019 09:42 3/8?21  FVC-Pre Latest Units: L 1.86 1.91 1.73 1.84 1.42  1.69 1.61 1.35 1.65 1.62 1.39  FVC-%Pred-Pre Latest Units: % '69 71 64 72 56 67 65 62 55 67 ' 67 57%   Results for Balon, Kecia A (MRN [id number]) as of 08/22/2019 11:56  Ref. Range 12/06/2013  13:03 03/15/2014 15:56 09/18/2014 11:41 02/26/2015 09:02 12/16/2015 16:31 04/29/2016 12:44 01/25/2017 10:16 01/11/2018 15:19 04/13/2018 08:47 08/16/2018 09:00 05/10/2019 09:42 01/29/20  DLCO unc Latest Units: ml/min/mmHg 11.99 11.70 11.53 5.52 10.06 12.12 10.43 10.57 10.16 9.21 10.32 9.36  DLCO unc % pred Latest Units: % '63 62 61 31 57 64 55 56 57 52 ' 63 57%    Simple office  walk 185 feet x  3 laps goal with forehead probe 04/13/2018  08/16/2018  12/15/2018  05/09/2019  08/22/2019  01/30/2020   O2 used Room air Room air Room air Room air Room air Room air  Number laps completed '3 3 3 3 2 ' stopped at 2 die to hip pain 3 laps - no hip pain following hip surgery  Comments about pace good Moderate pace Normal, hip bothering     Resting Pulse Ox/HR 98% and 73/min 98% and HR 77/min 99% and HR 61/min 98% and HR 70/min 98% 98% and 75/min  Final Pulse Ox/HR 91% and 91/min 93% and 92.min 94% and 92/min 93% and 98/min 91%  89% and 93/,imn  Desaturated </= 88% no no no no    Desaturated <= 3% points yes yes Yes, 5 points Yes, 5 points    Got Tachycardic >/= 90/min yes yes yes yes    Symptoms at end of test none none Hip pain and very mild dyspnea  Stopped due to hip pain Moderate duyspnea with mask  Miscellaneous comments x x    No hip pain    ROS - per HPI     has a past medical history of Allergy, Arthritis, Asthma, Cataract, Coronary artery calcification seen on CAT scan (08/19/2017), DDD (degenerative disc disease), thoracic, Depression, DOE (dyspnea on exertion), Fatty liver, GERD (gastroesophageal reflux disease), H/O steroid therapy, Heart palpitations (1/0/2111), Helicobacter pylori ab+, Hemorrhoids, Hiatal hernia, High cholesterol, History of chronic bronchitis, History of migraine, History of MRSA infection, Hyperlipidemia, mixed (08/19/2017), Hyperplastic colon polyp (2007), IBS (irritable bowel syndrome), Inguinal hernia, Insulin resistance, Interstitial lung disease ([place]), MVP (mitral valve prolapse),  Pneumonia, Pneumonitis, hypersensitivity ([place]), PONV (postoperative nausea and vomiting), Pre-diabetes, Pulmonary fibrosis ([place]), PVC (premature ventricular contraction) (08/19/2017), Rapid heart rate, Squamous cell carcinoma of skin, and Tinnitus, right ear.   reports that she has never smoked. She has never used smokeless tobacco.  Past Surgical History:  Procedure Laterality Date  . [place] STUDY N/A 02/21/2018   Procedure: [place] STUDY;  Surgeon: Mauri Pole, MD;  Location: [facility] ENDOSCOPY;  Service: Endoscopy;  Laterality: N/A;  . BREAST BIOPSY Right 2009   BIOPSY, pt denies  . BREAST BIOPSY Left 2003   Benign   . CATARACT EXTRACTION Left   . CESAREAN SECTION    . COLONOSCOPY    . ESOPHAGEAL MANOMETRY N/A 02/21/2018   Procedure: ESOPHAGEAL MANOMETRY (EM);  Surgeon: Mauri Pole, MD;  Location: [facility];  Service: Endoscopy;  Laterality: N/A;  . FOOT FRACTURE SURGERY  2006 or 2007   right  . HYMENECTOMY    . LUNG BIOPSY  09/28/2012   Procedure: LUNG BIOPSY;  Surgeon: Melrose Nakayama, MD;  Location: [place];  Service: Thoracic;  Laterality: N/A;  lung biopsies tims three  . SQUAMOUS CELL CARCINOMA EXCISION Left    left arm  . TOTAL HIP ARTHROPLASTY Right 09/10/2015   Procedure: RIGHT TOTAL HIP ARTHROPLASTY ANTERIOR APPROACH;  Surgeon: Paralee Cancel, MD;  Location: [facility];  Service: Orthopedics;  Laterality: Right;  . TOTAL HIP ARTHROPLASTY Left 10/03/2019   Procedure: TOTAL HIP ARTHROPLASTY ANTERIOR APPROACH;  Surgeon: Paralee Cancel, MD;  Location: [facility];  Service: Orthopedics;  Laterality: Left;  70 mins  . TUBAL LIGATION    . UPPER GI ENDOSCOPY    . VIDEO ASSISTED THORACOSCOPY  09/28/2012   Procedure: VIDEO ASSISTED THORACOSCOPY;  Surgeon: Melrose Nakayama, MD;  Location: [place];  Service: Thoracic;  Laterality:  Right;  Marland Kitchen VIDEO BRONCHOSCOPY  11/19/2011   Procedure: VIDEO BRONCHOSCOPY WITH FLUORO;  Surgeon: Brand Males, MD;  Location: [facility] END[facility];   Service: Endoscopy;;    Allergies  Allergen Reactions  . Atorvastatin Other (See Comments)    Leg pain  . Betadine [Povidone Iodine] Other (See Comments)    blisters  . Codeine Nausea And Vomiting  . Garlic Diarrhea  . Hydrocodone Nausea And Vomiting  . Macrobid [Nitrofurantoin] Other (See Comments)  . Ofev [Nintedanib] Other (See Comments)    Abdominal pain  . Onion Diarrhea  . Rosuvastatin Other (See Comments)    Leg pain  . Shellfish Allergy Nausea And Vomiting  . Sulfa Antibiotics   . Sulfonamide Derivatives Other (See Comments)    headaches    Immunization History  Administered Date(s) Administered  . Fluad Quad(high Dose 65+) 07/17/2019  . Hepatitis A 05/23/2010  . Hepatitis B 06/23/2006  . Influenza Split 08/11/2012, 08/14/2017  . Influenza Whole 09/08/2011  . Influenza, High Dose Seasonal PF 08/24/2016, 07/24/2017, 08/31/2018  . Influenza,inj,Quad PF,6+ Mos 09/07/2013  . Influenza-Unspecified 09/26/2014, 07/27/2015  . MMR 05/23/2010  . PFIZER SARS-COV-2 Vaccination 12/16/2019, 01/08/2020  . Pneumococcal Conjugate-13 03/15/2014  . Pneumococcal Polysaccharide-23 01/11/2018  . Td 02/22/2003  . Tdap 10/28/2012  . Zoster 12/04/2010  . Zoster Recombinat (Shingrix) 02/15/2017, 05/17/2017    Family History  Problem Relation Age of Onset  . Emphysema Maternal Grandmother   . Asthma Maternal Grandmother   . Asthma Mother   . Osteoarthritis Mother   . Dementia Mother 33  . Lymphoma Father   . Diabetes Father   . Hypertension Sister   . Heart disease Sister   . Kidney disease Sister   . Lung disease Maternal Grandfather   . Bone cancer Paternal Grandfather   . Allergic rhinitis Daughter   . Breast cancer Paternal Aunt   . Ovarian cancer Maternal Aunt   . Breast cancer Cousin   . Colon cancer Neg Hx   . Angioedema Neg Hx   . Eczema Neg Hx   . Immunodeficiency Neg Hx   . Urticaria Neg Hx      Current Outpatient Medications:  .  acetaminophen (TYLENOL)  500 MG tablet, Tylenol Extra Strength, Disp: , Rfl:  .  albuterol (PROAIR HFA) 108 (90 Base) MCG/ACT inhaler, Inhale 2 puffs into the lungs every 4 (four) hours as needed for wheezing. Or coughing spells.  You may use 2 Puffs 5-10 minutes before exercise., Disp: 1 Inhaler, Rfl: 3 .  Alpha-D-Galactosidase (BEANO PO), Take 1 tablet by mouth as needed (if eating onion or garlic). , Disp: , Rfl:  .  Ascorbic Acid (VITAMIN C) 1000 MG tablet, Take 1,000 mg by mouth daily., Disp: , Rfl:  .  aspirin EC 81 MG tablet, Take 81 mg by mouth daily., Disp: , Rfl:  .  chlorpheniramine (CHLOR-TRIMETON) 4 MG tablet, Take 2 mg by mouth 2 (two) times daily. Takes half tablet twice daily, Disp: , Rfl:  .  EPINEPHrine 0.3 mg/0.3 mL IJ SOAJ injection, Inject into the muscle., Disp: , Rfl:  .  ezetimibe (ZETIA) 10 MG tablet, TAKE 1 TABLET (10 MG TOTAL) BY MOUTH DAILY. PLEASE KEEP UPCOMING APPT FOR FUTURE REFILLS. THANK YOU., Disp: 90 tablet, Rfl: 3 .  fluticasone (FLONASE) 50 MCG/ACT nasal spray, SPRAY 2 SPRAYS INTO EACH NOSTRIL EVERY DAY, Disp: 48 mL, Rfl: 1 .  fluticasone furoate-vilanterol (BREO ELLIPTA) 100-25 MCG/INH AEPB, Inhale 1 puff into the lungs daily., Disp: 180 each, Rfl:  3 .  lactase (LACTAID) 3000 units tablet, Take by mouth., Disp: , Rfl:  .  metFORMIN (GLUCOPHAGE-XR) 500 MG 24 hr tablet, Take 500 mg by mouth daily with supper., Disp: , Rfl:  .  metoprolol succinate (TOPROL-XL) 50 MG 24 hr tablet, Take 50 mg by mouth daily. , Disp: , Rfl:  .  montelukast (SINGULAIR) 10 MG tablet, TAKE 1 TABLET BY MOUTH EVERYDAY AT BEDTIME, Disp: 90 tablet, Rfl: 2 .  Multiple Vitamin (MULTIVITAMIN) tablet, Take 1 tablet by mouth daily., Disp: , Rfl:  .  nystatin-triamcinolone (MYCOLOG II) cream, nystatin-triamcinolone 100,000 unit/g-0.1 % topical cream, Disp: , Rfl:  .  omeprazole (PRILOSEC) 20 MG capsule, Take 1 capsule (20 mg total) by mouth daily., Disp: 30 capsule, Rfl: 5 .  pravastatin (PRAVACHOL) 40 MG tablet, Take 40  mg by mouth every evening. , Disp: , Rfl:  .  predniSONE (DELTASONE) 10 MG tablet, TAKE 1 TABLET (10 MG TOTAL) BY MOUTH DAILY WITH BREAKFAST., Disp: 90 tablet, Rfl: 1 .  Probiotic Product (PROBIOTIC ADVANCED PO), Take 2 capsules by mouth daily. , Disp: , Rfl:  .  Vitamin D, Ergocalciferol, (DRISDOL) 1.25 MG (50000 UNIT) CAPS capsule, Take 50,000 Units by mouth every 7 (seven) days., Disp: , Rfl:  .  nitroGLYCERIN (NITROSTAT) 0.4 MG SL tablet, Place 1 tablet (0.4 mg total) under the tongue every 5 (five) minutes as needed for chest pain., Disp: 25 tablet, Rfl: 12      Objective:   Vitals:   03/19/20 0901  BP: (!) 116/56  Pulse: 74  Temp: (!) 97 F (36.1 C)  TempSrc: Temporal  SpO2: 96%  Weight: 142 lb (64.4 kg)  Height: 5' (1.524 m)    Estimated body mass index is 27.73 kg/m as calculated from the following:   Height as of this encounter: 5' (1.524 m).   Weight as of this encounter: 142 lb (64.4 kg).  '@WEIGHTCHANGE' @  [employer]   03/19/20 0901  Weight: 142 lb (64.4 kg)     Physical Exam nonfocal exam with scattered crackles alert and oriented x3.      Assessment:       ICD-10-CM   1. ILD (interstitial lung disease) ([place])  J84.9   2. 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 oil pain and ? mold in house  J67.9   3. Right-sided chest wall pain  R07.89        Plan:     Patient Instructions     ICD-10-CM   1. ILD (interstitial lung disease) ([place])  J84.9   2. 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 oil pain and ? mold in house  J67.9   3. Right-sided chest wall pain  R07.89     Glad pain and dyspnea all better with higher prednisone  - means pain from fibropsis possibly Too bad ofev did not work for you - abdominal pain and fever Glad you got rid of feather jackets Glad you only took macrobid for 1 day Glad that with rehab  You are not dropping oxygen  Plan  - ok to dc oxygen but keep the portable one just in case if you want  -  continue prednisone 81m per day  - list ofev and macrobid as allergies - cancel 04/16/20 breathing test  - keep pulmonix research appt 04/16/20 - - continue rehab  Followup  = spirometry and dlco end of august 2021/early sept 2021  - 30 min face [place] face [place]/early  sept 2021   (Level 04: Estb 30-39 min    visit type: on-site physical face to visit visit spent in total care time and counseling or/and coordination of care by this undersigned MD - Dr Brand Males. This includes one or more of the following on this same day 03/19/2020: pre-charting, chart review, note writing, documentation discussion of test results, diagnostic or treatment recommendations, prognosis, risks and benefits of management options, instructions, education, compliance or risk-factor reduction. It excludes time spent by the [place] or office staff in the care of the patient . Actual time is 35 min)   SIGNATURE    Dr. Brand Males, M.D., F.C.C.P,  Pulmonary and Critical Care Medicine Staff Physician, [place] Director - Interstitial Lung Disease  Program  Pulmonary [place] at [place], [place], [address]  Pager: [phone], If no answer or between  15:00h - 7:00h: call [phone] Telephone: [phone]  9:36 AM 03/19/2020

## 2020-03-20 DIAGNOSIS — R7301 Impaired fasting glucose: Secondary | ICD-10-CM | POA: Diagnosis not present

## 2020-03-20 DIAGNOSIS — L249 Irritant contact dermatitis, unspecified cause: Secondary | ICD-10-CM | POA: Diagnosis not present

## 2020-03-20 DIAGNOSIS — F4320 Adjustment disorder, unspecified: Secondary | ICD-10-CM | POA: Diagnosis not present

## 2020-03-20 DIAGNOSIS — J8410 Pulmonary fibrosis, unspecified: Secondary | ICD-10-CM | POA: Diagnosis not present

## 2020-03-21 ENCOUNTER — Encounter (HOSPITAL_COMMUNITY)
Admission: RE | Admit: 2020-03-21 | Discharge: 2020-03-21 | Disposition: A | Discharge status: Home / Self Care | Payer: Medicare Other | Source: Ambulatory Visit | Attending: Internal Medicine | Admitting: Internal Medicine

## 2020-03-21 ENCOUNTER — Other Ambulatory Visit: Payer: Self-pay

## 2020-03-21 DIAGNOSIS — J849 Interstitial pulmonary disease, unspecified: Secondary | ICD-10-CM | POA: Diagnosis not present

## 2020-03-21 NOTE — Progress Notes (Signed)
Daily Session Note  Patient Details  Name: Rhonda [name] MRN: [id number] Date of Birth: 01-Aug-1951 Referring Provider:     Pulmonary Rehab Walk Test from 02/20/2020 in [place]  Referring Provider  Dr. Chase Caller      Encounter Date: 03/21/2020  Check In: Session Check In - 03/21/20 1316      Check-In   Supervising physician immediately available to respond to emergencies  [facility] immediately available    Physician(s)  Dr. Philis Pique    Location  [facility]    Staff Present  Rosebud Poles, RN, Bjorn Loser, MS, Exercise Physiologist;Laguana Desautel Ysidro Evert, RN    Virtual Visit  No    Medication changes reported      No    Fall or balance concerns reported     No    Tobacco Cessation  No Change    Warm-up and Cool-down  Performed as group-led instruction    Resistance Training Performed  Yes    VAD Patient?  No    PAD/SET Patient?  No      Pain Assessment   Currently in Pain?  No/denies    Multiple Pain Sites  No       Capillary Blood Glucose: No results found for this or any previous visit (from the past 24 hour(s)).    Social History   Tobacco Use  Smoking Status Never Smoker  Smokeless Tobacco Never Used  Tobacco Comment   pt states she experimented in college    Goals Met:  Exercise tolerated well No report of cardiac concerns or symptoms Strength training completed today  Goals Unmet:  Not Applicable  Comments: Service time is from 1250 to 1355    Dr. Fransico Him is Medical Director for Cardiac Rehab at [facility].

## 2020-03-26 ENCOUNTER — Other Ambulatory Visit: Payer: Self-pay

## 2020-03-26 ENCOUNTER — Encounter (HOSPITAL_COMMUNITY)
Admission: RE | Admit: 2020-03-26 | Discharge: 2020-03-26 | Disposition: A | Discharge status: Home / Self Care | Payer: Medicare Other | Source: Ambulatory Visit | Attending: Internal Medicine | Admitting: Internal Medicine

## 2020-03-26 DIAGNOSIS — J849 Interstitial pulmonary disease, unspecified: Secondary | ICD-10-CM | POA: Insufficient documentation

## 2020-03-26 NOTE — Progress Notes (Signed)
Daily Session Note  Patient Details  Name: Rhonda [name] MRN: [id number] Date of Birth: [birth year] Referring Provider:     Pulmonary Rehab Walk Test from 02/20/2020 in [place]  Referring Provider  Dr. Chase Caller      Encounter Date: 03/26/2020  Check In: Session Check In - 03/26/20 1310      Check-In   Supervising physician immediately available to respond to emergencies  [facility] immediately available    Physician(s)  Dr. Sherral Hammers    Location  [facility]    Staff Present  Rosebud Poles, RN, Bjorn Loser, MS, Exercise Physiologist;[name] Ysidro Evert, RN    Virtual Visit  No    Medication changes reported      No    Fall or balance concerns reported     No    Tobacco Cessation  No Change    Warm-up and Cool-down  Performed on first and last piece of equipment    Resistance Training Performed  Yes    VAD Patient?  No    PAD/SET Patient?  No      Pain Assessment   Currently in Pain?  No/denies    Multiple Pain Sites  No       Capillary Blood Glucose: No results found for this or any previous visit (from the past 24 hour(s)).  Exercise Prescription Changes - 03/26/20 1400      Response to Exercise   Blood Pressure (Admit)  106/70    Blood Pressure (Exercise)  134/70    Blood Pressure (Exit)  106/54    Heart Rate (Admit)  78 bpm    Heart Rate (Exercise)  102 bpm    Heart Rate (Exit)  88 bpm    Oxygen Saturation (Admit)  96 %    Oxygen Saturation (Exercise)  89 %    Oxygen Saturation (Exit)  88 %    Rating of Perceived Exertion (Exercise)  15    Perceived Dyspnea (Exercise)  3    Duration  Continue with 30 min of aerobic exercise without signs/symptoms of physical distress.    Intensity  THRR unchanged      Progression   Progression  Continue to progress workloads to maintain intensity without signs/symptoms of physical distress.      Resistance Training   Training Prescription  Yes    Weight  orange bands     Reps  10-15    Time  10 Minutes      Interval Training   Interval Training  No      Treadmill   MPH  1.7    Grade  1    Minutes  15      NuStep   Level  4    SPM  80    Minutes  15    METs  2.5       Social History   Tobacco Use  Smoking Status Never Smoker  Smokeless Tobacco Never Used  Tobacco Comment   pt states she experimented in college    Goals Met:  Proper associated with RPD/PD & O2 Sat Exercise tolerated well Strength training completed today  Goals Unmet:  Not Applicable  Comments: Service time is from 1250 to 78    Dr. Fransico Him is Medical Director for Cardiac Rehab at [facility].

## 2020-03-28 ENCOUNTER — Encounter (HOSPITAL_COMMUNITY): Payer: Medicare Other

## 2020-04-02 ENCOUNTER — Encounter (HOSPITAL_COMMUNITY)
Admission: RE | Admit: 2020-04-02 | Discharge: 2020-04-02 | Disposition: A | Discharge status: Home / Self Care | Payer: Medicare Other | Source: Ambulatory Visit | Attending: Internal Medicine | Admitting: Internal Medicine

## 2020-04-02 ENCOUNTER — Other Ambulatory Visit: Payer: Self-pay

## 2020-04-02 DIAGNOSIS — J849 Interstitial pulmonary disease, unspecified: Secondary | ICD-10-CM

## 2020-04-02 NOTE — Progress Notes (Signed)
Daily Session Note  Patient Details  Name: Rhonda [name] MRN: [id number] Date of Birth: 25-Nov-1950 Referring Provider:     Pulmonary Rehab Walk Test from 02/20/2020 in [place]  Referring Provider  Dr. Chase Caller      Encounter Date: 04/02/2020  Check In: Session Check In - 04/02/20 1250      Check-In   Supervising physician immediately available to respond to emergencies  [facility] immediately available    Physician(s)  Dr. Posey Pronto    Location  [facility]    Staff Present  Rosebud Poles, RN, Bjorn Loser, MS, Exercise Physiologist;Tiajah Oyster Ysidro Evert, RN    Virtual Visit  No    Medication changes reported      No    Fall or balance concerns reported     No    Tobacco Cessation  No Change    Warm-up and Cool-down  Performed on first and last piece of equipment    Resistance Training Performed  Yes    VAD Patient?  No    PAD/SET Patient?  No      Pain Assessment   Currently in Pain?  No/denies    Multiple Pain Sites  No       Capillary Blood Glucose: No results found for this or any previous visit (from the past 24 hour(s)).    Social History   Tobacco Use  Smoking Status Never Smoker  Smokeless Tobacco Never Used  Tobacco Comment   pt states she experimented in college    Goals Met:  Exercise tolerated well No report of cardiac concerns or symptoms Strength training completed today  Goals Unmet:  Not Applicable  Comments: Service time is from 1245 to 1357    Dr. Fransico Him is Medical Director for Cardiac Rehab at [facility].

## 2020-04-04 ENCOUNTER — Encounter (HOSPITAL_COMMUNITY): Payer: Medicare Other

## 2020-04-09 ENCOUNTER — Other Ambulatory Visit: Payer: Self-pay

## 2020-04-09 ENCOUNTER — Encounter (HOSPITAL_COMMUNITY)
Admission: RE | Admit: 2020-04-09 | Discharge: 2020-04-09 | Disposition: A | Discharge status: Home / Self Care | Payer: Medicare Other | Source: Ambulatory Visit | Attending: Internal Medicine | Admitting: Internal Medicine

## 2020-04-09 VITALS — Wt 143.5 lb

## 2020-04-09 DIAGNOSIS — J849 Interstitial pulmonary disease, unspecified: Secondary | ICD-10-CM

## 2020-04-09 NOTE — Progress Notes (Signed)
Daily Session Note  Patient Details  Name: Rhonda [name] MRN: [id number] Date of Birth: [birth year] Referring Provider:     Pulmonary Rehab Walk Test from 02/20/2020 in [place]  Referring Provider  Dr. Chase Caller      Encounter Date: 04/09/2020  Check In: Session Check In - 04/09/20 1320      Check-In   Supervising physician immediately available to respond to emergencies  [facility] immediately available    Physician(s)  Dr. Cyndia Skeeters    Location  [facility]    Staff Present  Rosebud Poles, RN, BSN;[name] Ysidro Evert, RN;Dalton Kris Mouton, MS, CEP, Exercise Physiologist    Virtual Visit  No    Medication changes reported      No    Fall or balance concerns reported     No    Tobacco Cessation  No Change    Warm-up and Cool-down  Performed as group-led instruction    Resistance Training Performed  Yes    VAD Patient?  No    PAD/SET Patient?  No      Pain Assessment   Currently in Pain?  No/denies    Multiple Pain Sites  No       Capillary Blood Glucose: No results found for this or any previous visit (from the past 24 hour(s)). POCT Glucose - 04/09/20 1440      POCT Blood Glucose   Pre-Exercise  131 mg/dL    Post-Exercise  126 mg/dL      Exercise Prescription Changes - 04/09/20 1400      Response to Exercise   Blood Pressure (Admit)  104/60    Blood Pressure (Exercise)  134/60    Blood Pressure (Exit)  106/60    Heart Rate (Admit)  76 bpm    Heart Rate (Exercise)  94 bpm    Heart Rate (Exit)  79 bpm    Oxygen Saturation (Admit)  95 %    Oxygen Saturation (Exercise)  93 %    Oxygen Saturation (Exit)  79 %    Rating of Perceived Exertion (Exercise)  15    Perceived Dyspnea (Exercise)  3    Duration  Continue with 30 min of aerobic exercise without signs/symptoms of physical distress.    Intensity  THRR unchanged      Progression   Progression  Continue to progress workloads to maintain intensity without  signs/symptoms of physical distress.      Resistance Training   Training Prescription  Yes    Weight  orange bands    Reps  10-15    Time  10 Minutes      Interval Training   Interval Training  No      Treadmill   MPH  1.7    Grade  1    Minutes  15      NuStep   Level  4    SPM  80    Minutes  15    METs  2.5       Social History   Tobacco Use  Smoking Status Never Smoker  Smokeless Tobacco Never Used  Tobacco Comment   pt states she experimented in college    Goals Met:  Exercise tolerated well No report of cardiac concerns or symptoms Strength training completed today  Goals Unmet:  Not Applicable  Comments: Service time is from 1250 to 1350    Dr. Fransico Him is Medical Director for Cardiac Rehab at [facility]

## 2020-04-10 NOTE — Progress Notes (Signed)
Pulmonary Individual Treatment Plan  Patient Details  Name: Rhonda [name] MRN: [id number] Date of Birth: November 29, 1950 Referring Provider:     Pulmonary Rehab Walk Test from 02/20/2020 in [place]  Referring Provider  Dr. Chase Caller      Initial Encounter Date:    Pulmonary Rehab Walk Test from 02/20/2020 in [place]  Date  02/20/20      Visit Diagnosis: Interstitial lung disease ([facility])  Patient's Home Medications on Admission:   Current Outpatient Medications:  .  acetaminophen (TYLENOL) 500 MG tablet, Tylenol Extra Strength, Disp: , Rfl:  .  albuterol (PROAIR HFA) 108 (90 Base) MCG/ACT inhaler, Inhale 2 puffs into the lungs every 4 (four) hours as needed for wheezing. Or coughing spells.  You may use 2 Puffs 5-10 minutes before exercise., Disp: 1 Inhaler, Rfl: 3 .  Alpha-D-Galactosidase (BEANO PO), Take 1 tablet by mouth as needed (if eating onion or garlic). , Disp: , Rfl:  .  Ascorbic Acid (VITAMIN C) 1000 MG tablet, Take 1,000 mg by mouth daily., Disp: , Rfl:  .  aspirin EC 81 MG tablet, Take 81 mg by mouth daily., Disp: , Rfl:  .  chlorpheniramine (CHLOR-TRIMETON) 4 MG tablet, Take 2 mg by mouth 2 (two) times daily. Takes half tablet twice daily, Disp: , Rfl:  .  EPINEPHrine 0.3 mg/0.3 mL IJ SOAJ injection, Inject into the muscle., Disp: , Rfl:  .  ezetimibe (ZETIA) 10 MG tablet, TAKE 1 TABLET (10 MG TOTAL) BY MOUTH DAILY. PLEASE KEEP UPCOMING APPT FOR FUTURE REFILLS. THANK YOU., Disp: 90 tablet, Rfl: 3 .  fluticasone (FLONASE) 50 MCG/ACT nasal spray, SPRAY 2 SPRAYS INTO EACH NOSTRIL EVERY DAY, Disp: 48 mL, Rfl: 1 .  fluticasone furoate-vilanterol (BREO ELLIPTA) 100-25 MCG/INH AEPB, Inhale 1 puff into the lungs daily., Disp: 180 each, Rfl: 3 .  lactase (LACTAID) 3000 units tablet, Take by mouth., Disp: , Rfl:  .  metFORMIN (GLUCOPHAGE-XR) 500 MG 24 hr tablet, Take 500 mg by mouth daily with supper., Disp: , Rfl:   .  metoprolol succinate (TOPROL-XL) 50 MG 24 hr tablet, Take 50 mg by mouth daily. , Disp: , Rfl:  .  montelukast (SINGULAIR) 10 MG tablet, TAKE 1 TABLET BY MOUTH EVERYDAY AT BEDTIME, Disp: 90 tablet, Rfl: 2 .  Multiple Vitamin (MULTIVITAMIN) tablet, Take 1 tablet by mouth daily., Disp: , Rfl:  .  nitroGLYCERIN (NITROSTAT) 0.4 MG SL tablet, Place 1 tablet (0.4 mg total) under the tongue every 5 (five) minutes as needed for chest pain., Disp: 25 tablet, Rfl: 12 .  nystatin-triamcinolone (MYCOLOG II) cream, nystatin-triamcinolone 100,000 unit/g-0.1 % topical cream, Disp: , Rfl:  .  omeprazole (PRILOSEC) 20 MG capsule, Take 1 capsule (20 mg total) by mouth daily., Disp: 30 capsule, Rfl: 5 .  pravastatin (PRAVACHOL) 40 MG tablet, Take 40 mg by mouth every evening. , Disp: , Rfl:  .  predniSONE (DELTASONE) 10 MG tablet, TAKE 1 TABLET (10 MG TOTAL) BY MOUTH DAILY WITH BREAKFAST., Disp: 90 tablet, Rfl: 1 .  Probiotic Product (PROBIOTIC ADVANCED PO), Take 2 capsules by mouth daily. , Disp: , Rfl:  .  Vitamin D, Ergocalciferol, (DRISDOL) 1.25 MG (50000 UNIT) CAPS capsule, Take 50,000 Units by mouth every 7 (seven) days., Disp: , Rfl:   Past Medical History: Past Medical History:  Diagnosis Date  . Allergy   . Arthritis    history spinal stenosis. osteoarthritis right hip  . Asthma   .  Cataract   . Coronary artery calcification seen on CAT scan 08/19/2017   >300 on CT scan 08/2017  . DDD (degenerative disc disease), thoracic   . Depression   . DOE (dyspnea on exertion)    a. 04/2010 Lexi MV EF 71%, no ischemia/infarct;    . Fatty liver   . GERD (gastroesophageal reflux disease)   . H/O steroid therapy    Steroid use orally over 4 yrs- for Lung Fibrosis  . Heart palpitations 02/28/2015  . Helicobacter pylori ab+   . Hemorrhoids   . Hiatal hernia   . High cholesterol   . History of chronic bronchitis    as child  . History of migraine   . History of MRSA infection   . Hyperlipidemia, mixed  08/19/2017  . Hyperplastic colon polyp 2007  . IBS (irritable bowel syndrome)   . Inguinal hernia    right  . Insulin resistance    past  . Interstitial lung disease ([place])   . MVP (mitral valve prolapse)    Posterior mitral valve leaflet with mild MR  . Pneumonia   . Pneumonitis, hypersensitivity ([place])    a. 09/2012 s/p Bx - ? 2/2 bird, mold, oil paint exposure ->on steroids, followed by pulm.  Marland Kitchen PONV (postoperative nausea and vomiting)   . Pre-diabetes    takes Metformin  . Pulmonary fibrosis ([facility])    Dr. Chase Caller follows- stable at present  . PVC (premature ventricular contraction) 08/19/2017  . Rapid heart rate    Dr. Radford Pax follows- last visit Epic note 9'16  . Squamous cell carcinoma of skin   . Tinnitus, right ear     Tobacco Use: Social History   Tobacco Use  Smoking Status Never Smoker  Smokeless Tobacco Never Used  Tobacco Comment   pt states she experimented in college    Labs: Recent Review [facility] for ITP Cardiac and Pulmonary Rehab Latest Ref Rng & Units 02/26/2015 08/21/2015 11/08/2017 12/08/2018 09/28/2019   Cholestrol 100 - 199 mg/dL 184 - 180 177 -   LDLCALC 0 - 99 mg/dL 94 - 77 83 -   HDL >39 mg/dL 53 - 71 68 -   Trlycerides 0 - 149 mg/dL 185(H) - 158(H) 131 -   Hemoglobin A1c 4.8 - 5.6 % - 6.1(H) - 6.0(H) 6.2(H)   PHART 7.350 - 7.450 - - - - -   PCO2ART 35.0 - 45.0 mmHg - - - - -   HCO3 20.0 - 24.0 mEq/L - - - - -   TCO2 0 - 100 mmol/L - - - - -   ACIDBASEDEF 0.0 - 2.0 mmol/L - - - - -   O2SAT % - - - - -      Capillary Blood Glucose: Lab Results  Component Value Date   GLUCAP 209 (H) 10/04/2019   GLUCAP 150 (H) 10/04/2019   GLUCAP 139 (H) 10/03/2019   GLUCAP 112 (H) 10/03/2019   GLUCAP 93 10/03/2019   POCT Glucose    Row Name 03/12/20 1434 04/09/20 1440           POCT Blood Glucose   Pre-Exercise  131 mg/dL  131 mg/dL      Post-Exercise  167 mg/dL  126 mg/dL         Pulmonary Assessment Scores: Pulmonary Assessment  Scores    Row Name 02/20/20 1510 02/20/20 1531       ADL UCSD   ADL Phase  Entry  (Pended)  Entry      CAT Score   CAT Score  17  (Pended)   --      mMRC Score   mMRC Score  --  2      UCSD: Self-administered rating of dyspnea associated with activities of daily living (ADLs) 6-point scale (0 = "not at all" to 5 = "maximal or unable to do because of breathlessness")  Scoring Scores range from 0 to 120.  Minimally important difference is 5 units  CAT: CAT can identify the health impairment of COPD patients and is better correlated with disease progression.  CAT has a scoring range of zero to 40. The CAT score is classified into four groups of low (less than 10), medium (10 - 20), high (21-30) and very high (31-40) based on the impact level of disease on health status. A CAT score over 10 suggests significant symptoms.  A worsening CAT score could be explained by an exacerbation, poor medication adherence, poor inhaler technique, or progression of COPD or comorbid conditions.  CAT MCID is 2 points  mMRC: mMRC (Modified Medical Research Council) Dyspnea Scale is used to assess the degree of baseline functional disability in patients of respiratory disease due to dyspnea. No minimal important difference is established. A decrease in score of 1 point or greater is considered a positive change.   Pulmonary Function Assessment:   Exercise Target Goals: Exercise Program Goal: Individual exercise prescription set using results from initial 6 min walk test and THRR while considering  patient's activity barriers and safety.   Exercise Prescription Goal: Initial exercise prescription builds to 30-45 minutes a day of aerobic activity, 2-3 days per week.  Home exercise guidelines will be given to patient during program as part of exercise prescription that the participant will acknowledge.  Activity Barriers & Risk Stratification: Activity Barriers & Cardiac Risk Stratification - 02/20/20  1447      Activity Barriers & Cardiac Risk Stratification   Activity Barriers  Left Hip Replacement;Right Hip Replacement;Deconditioning       6 Minute Walk: 6 Minute Walk    Row Name 02/20/20 1532         6 Minute Walk   Distance  1257 feet     Walk Time  6 minutes     # of Rest Breaks  0     MPH  2.38     METS  2.51     RPE  12     Perceived Dyspnea   2     VO2 Peak  8.79     Symptoms  Yes (comment)     Comments  L hip pain 1/10     Resting HR  75 bpm     Resting BP  114/66     Resting Oxygen Saturation   100 %     Exercise Oxygen Saturation  during 6 min walk  93 %     Max Ex. HR  94 bpm     Max Ex. BP  122/74     2 Minute Post BP  108/70       Interval HR   1 Minute HR  85     2 Minute HR  92     3 Minute HR  94     4 Minute HR  92     5 Minute HR  90     6 Minute HR  82     2 Minute Post HR  71  Interval Heart Rate?  Yes       Interval Oxygen   Interval Oxygen?  Yes     Baseline Oxygen Saturation %  100 %     1 Minute Oxygen Saturation %  99 %     1 Minute Liters of Oxygen  0 L     2 Minute Oxygen Saturation %  94 %     2 Minute Liters of Oxygen  0 L     3 Minute Oxygen Saturation %  93 %     3 Minute Liters of Oxygen  0 L     4 Minute Oxygen Saturation %  95 %     4 Minute Liters of Oxygen  0 L     5 Minute Oxygen Saturation %  94 %     5 Minute Liters of Oxygen  0 L     6 Minute Oxygen Saturation %  94 %     6 Minute Liters of Oxygen  0 L     2 Minute Post Oxygen Saturation %  98 %     2 Minute Post Liters of Oxygen  0 L        Oxygen Initial Assessment: Oxygen Initial Assessment - 02/20/20 1530      Home Oxygen   Home Oxygen Device  Portable Concentrator    Sleep Oxygen Prescription  None    Home Exercise Oxygen Prescription  None    Home at Rest Exercise Oxygen Prescription  None    Compliance with Home Oxygen Use  Yes      Initial 6 min Walk   Oxygen Used  None      Program Oxygen Prescription   Program Oxygen Prescription  None       Intervention   Short Term Goals  To learn and exhibit compliance with exercise, home and travel O2 prescription;To learn and understand importance of monitoring SPO2 with pulse oximeter and demonstrate accurate use of the pulse oximeter.;To learn and understand importance of maintaining oxygen saturations>88%;To learn and demonstrate proper pursed lip breathing techniques or other breathing techniques.;To learn and demonstrate proper use of respiratory medications    Long  Term Goals  Exhibits compliance with exercise, home and travel O2 prescription;Verbalizes importance of monitoring SPO2 with pulse oximeter and return demonstration;Maintenance of O2 saturations>88%;Exhibits proper breathing techniques, such as pursed lip breathing or other method taught during program session;Compliance with respiratory medication;Demonstrates proper use of MDI's       Oxygen Re-Evaluation: Oxygen Re-Evaluation    Row Name 03/12/20 0810 04/09/20 0742           Program Oxygen Prescription   Program Oxygen Prescription  None  None        Home Oxygen   Home Oxygen Device  Portable Concentrator  Portable Concentrator      Sleep Oxygen Prescription  None  None      Home Exercise Oxygen Prescription  None  None      Liters per minute  3  3      Home at Rest Exercise Oxygen Prescription  None  None      Compliance with Home Oxygen Use  Yes  Yes        Goals/Expected Outcomes   Short Term Goals  To learn and exhibit compliance with exercise, home and travel O2 prescription;To learn and understand importance of monitoring SPO2 with pulse oximeter and demonstrate accurate use of the pulse oximeter.;To learn and understand importance of maintaining  oxygen saturations>88%;To learn and demonstrate proper pursed lip breathing techniques or other breathing techniques.;To learn and demonstrate proper use of respiratory medications  To learn and exhibit compliance with exercise, home and travel O2  prescription;To learn and understand importance of monitoring SPO2 with pulse oximeter and demonstrate accurate use of the pulse oximeter.;To learn and understand importance of maintaining oxygen saturations>88%;To learn and demonstrate proper pursed lip breathing techniques or other breathing techniques.;To learn and demonstrate proper use of respiratory medications      Long  Term Goals  Exhibits compliance with exercise, home and travel O2 prescription;Verbalizes importance of monitoring SPO2 with pulse oximeter and return demonstration;Maintenance of O2 saturations>88%;Exhibits proper breathing techniques, such as pursed lip breathing or other method taught during program session;Compliance with respiratory medication;Demonstrates proper use of MDI's  Exhibits compliance with exercise, home and travel O2 prescription;Verbalizes importance of monitoring SPO2 with pulse oximeter and return demonstration;Maintenance of O2 saturations>88%;Exhibits proper breathing techniques, such as pursed lip breathing or other method taught during program session;Compliance with respiratory medication;Demonstrates proper use of MDI's      Goals/Expected Outcomes  compliance  compliance         Oxygen Discharge (Final Oxygen Re-Evaluation): Oxygen Re-Evaluation - 04/09/20 0742      Program Oxygen Prescription   Program Oxygen Prescription  None      Home Oxygen   Home Oxygen Device  Portable Concentrator    Sleep Oxygen Prescription  None    Home Exercise Oxygen Prescription  None    Liters per minute  3    Home at Rest Exercise Oxygen Prescription  None    Compliance with Home Oxygen Use  Yes      Goals/Expected Outcomes   Short Term Goals  To learn and exhibit compliance with exercise, home and travel O2 prescription;To learn and understand importance of monitoring SPO2 with pulse oximeter and demonstrate accurate use of the pulse oximeter.;To learn and understand importance of maintaining oxygen  saturations>88%;To learn and demonstrate proper pursed lip breathing techniques or other breathing techniques.;To learn and demonstrate proper use of respiratory medications    Long  Term Goals  Exhibits compliance with exercise, home and travel O2 prescription;Verbalizes importance of monitoring SPO2 with pulse oximeter and return demonstration;Maintenance of O2 saturations>88%;Exhibits proper breathing techniques, such as pursed lip breathing or other method taught during program session;Compliance with respiratory medication;Demonstrates proper use of MDI's    Goals/Expected Outcomes  compliance       Initial Exercise Prescription: Initial Exercise Prescription - 02/20/20 1500      Date of Initial Exercise RX and Referring Provider   Date  02/20/20    Referring Provider  Dr. Chase Caller      Treadmill   MPH  2    Grade  0    Minutes  15      NuStep   Level  3    SPM  80    Minutes  15      Prescription Details   Frequency (times per week)  2    Duration  Progress to 30 minutes of continuous aerobic without signs/symptoms of physical distress      Intensity   THRR 40-80% of Max Heartrate  60-120    Ratings of Perceived Exertion  11-13    Perceived Dyspnea  0-4      Progression   Progression  Continue to progress workloads to maintain intensity without signs/symptoms of physical distress.      Horticulturist, commercial Prescription  Yes    Weight  orange bands    Reps  10-15       Perform Capillary Blood Glucose checks as needed.  Exercise Prescription Changes: Exercise Prescription Changes    Row Name 03/12/20 1400 03/12/20 1500 03/26/20 1400 04/09/20 1400       Response to Exercise   Blood Pressure (Admit)  104/60  --  106/70  104/60    Blood Pressure (Exercise)  110/52  --  134/70  134/60    Blood Pressure (Exit)  104/64  --  106/54  106/60    Heart Rate (Admit)  81 bpm  --  78 bpm  76 bpm    Heart Rate (Exercise)  99 bpm  --  102 bpm  94 bpm    Heart  Rate (Exit)  82 bpm  --  88 bpm  79 bpm    Oxygen Saturation (Admit)  97 %  --  96 %  95 %    Oxygen Saturation (Exercise)  88 %  --  89 %  93 %    Oxygen Saturation (Exit)  95 %  --  88 %  79 %    Rating of Perceived Exertion (Exercise)  15  --  15  15    Perceived Dyspnea (Exercise)  3  --  3  3    Duration  Continue with 30 min of aerobic exercise without signs/symptoms of physical distress.  --  Continue with 30 min of aerobic exercise without signs/symptoms of physical distress.  Continue with 30 min of aerobic exercise without signs/symptoms of physical distress.    Intensity  Other (comment) 40-80% of HRR  --  THRR unchanged  THRR unchanged      Progression   Progression  Continue to progress workloads to maintain intensity without signs/symptoms of physical distress.  --  Continue to progress workloads to maintain intensity without signs/symptoms of physical distress.  Continue to progress workloads to maintain intensity without signs/symptoms of physical distress.      Resistance Training   Training Prescription  Yes  --  Yes  Yes    Weight  orange bands  --  orange bands  orange bands    Reps  10-15  --  10-15  10-15    Time  10 Minutes  --  10 Minutes  10 Minutes      Interval Training   Interval Training  --  --  No  No      Treadmill   MPH  1.7  --  1.7  1.7    Grade  0  --  1  1    Minutes  15  --  15  15      NuStep   Level  3  --  4  4    SPM  80  --  80  80    Minutes  15  --  15  15    METs  2.1  --  2.5  2.5      Home Exercise Plan   Plans to continue exercise at  --  Home (comment)  --  --    Frequency  --  Add 3 additional days to program exercise sessions.  --  --    Initial Home Exercises Provided  --  03/12/20  --  --       Exercise Comments: Exercise Comments    Row Name 03/12/20 [id number]  Exercise Comments  home exercise complete          Exercise Goals and Review: Exercise Goals    Row Name 02/20/20 1541 03/12/20 0811 04/09/20 0743          Exercise Goals   Increase Physical Activity  Yes  Yes  Yes     Intervention  Provide advice, education, support and counseling about physical activity/exercise needs.;Develop an individualized exercise prescription for aerobic and resistive training based on initial evaluation findings, risk stratification, comorbidities and participant's personal goals.  Provide advice, education, support and counseling about physical activity/exercise needs.;Develop an individualized exercise prescription for aerobic and resistive training based on initial evaluation findings, risk stratification, comorbidities and participant's personal goals.  Provide advice, education, support and counseling about physical activity/exercise needs.;Develop an individualized exercise prescription for aerobic and resistive training based on initial evaluation findings, risk stratification, comorbidities and participant's personal goals.     Expected Outcomes  Short Term: Attend rehab on a regular basis to increase amount of physical activity.;Long Term: Add in home exercise to make exercise part of routine and to increase amount of physical activity.;Long Term: Exercising regularly at least 3-5 days a week.  Short Term: Attend rehab on a regular basis to increase amount of physical activity.;Long Term: Add in home exercise to make exercise part of routine and to increase amount of physical activity.;Long Term: Exercising regularly at least 3-5 days a week.  Short Term: Attend rehab on a regular basis to increase amount of physical activity.;Long Term: Add in home exercise to make exercise part of routine and to increase amount of physical activity.;Long Term: Exercising regularly at least 3-5 days a week.     Increase Strength and Stamina  Yes  Yes  Yes     Intervention  Provide advice, education, support and counseling about physical activity/exercise needs.;Develop an individualized exercise prescription for aerobic and  resistive training based on initial evaluation findings, risk stratification, comorbidities and participant's personal goals.  Provide advice, education, support and counseling about physical activity/exercise needs.;Develop an individualized exercise prescription for aerobic and resistive training based on initial evaluation findings, risk stratification, comorbidities and participant's personal goals.  Provide advice, education, support and counseling about physical activity/exercise needs.;Develop an individualized exercise prescription for aerobic and resistive training based on initial evaluation findings, risk stratification, comorbidities and participant's personal goals.     Expected Outcomes  Short Term: Increase workloads from initial exercise prescription for resistance, speed, and METs.;Short Term: Perform resistance training exercises routinely during rehab and add in resistance training at home;Long Term: Improve cardiorespiratory fitness, muscular endurance and strength as measured by increased METs and functional capacity (6MWT)  Short Term: Increase workloads from initial exercise prescription for resistance, speed, and METs.;Short Term: Perform resistance training exercises routinely during rehab and add in resistance training at home;Long Term: Improve cardiorespiratory fitness, muscular endurance and strength as measured by increased METs and functional capacity (6MWT)  Short Term: Increase workloads from initial exercise prescription for resistance, speed, and METs.;Short Term: Perform resistance training exercises routinely during rehab and add in resistance training at home;Long Term: Improve cardiorespiratory fitness, muscular endurance and strength as measured by increased METs and functional capacity (6MWT)     Able to understand and use rate of perceived exertion (RPE) scale  Yes  Yes  Yes     Intervention  Provide education and explanation on how to use RPE scale  Provide education and  explanation on how to use RPE scale  Provide education and  explanation on how to use RPE scale     Expected Outcomes  Short Term: Able to use RPE daily in rehab to express subjective intensity level;Long Term:  Able to use RPE to guide intensity level when exercising independently  Short Term: Able to use RPE daily in rehab to express subjective intensity level;Long Term:  Able to use RPE to guide intensity level when exercising independently  Short Term: Able to use RPE daily in rehab to express subjective intensity level;Long Term:  Able to use RPE to guide intensity level when exercising independently     Able to understand and use Dyspnea scale  Yes  Yes  Yes     Intervention  Provide education and explanation on how to use Dyspnea scale  Provide education and explanation on how to use Dyspnea scale  Provide education and explanation on how to use Dyspnea scale     Expected Outcomes  Short Term: Able to use Dyspnea scale daily in rehab to express subjective sense of shortness of breath during exertion;Long Term: Able to use Dyspnea scale to guide intensity level when exercising independently  Short Term: Able to use Dyspnea scale daily in rehab to express subjective sense of shortness of breath during exertion;Long Term: Able to use Dyspnea scale to guide intensity level when exercising independently  Short Term: Able to use Dyspnea scale daily in rehab to express subjective sense of shortness of breath during exertion;Long Term: Able to use Dyspnea scale to guide intensity level when exercising independently     Knowledge and understanding of Target Heart Rate Range (THRR)  Yes  Yes  Yes     Intervention  Provide education and explanation of THRR including how the numbers were predicted and where they are located for reference  Provide education and explanation of THRR including how the numbers were predicted and where they are located for reference  Provide education and explanation of THRR including how  the numbers were predicted and where they are located for reference     Expected Outcomes  Short Term: Able to state/look up THRR;Long Term: Able to use THRR to govern intensity when exercising independently;Short Term: Able to use daily as guideline for intensity in rehab  Short Term: Able to state/look up THRR;Long Term: Able to use THRR to govern intensity when exercising independently;Short Term: Able to use daily as guideline for intensity in rehab  Short Term: Able to state/look up THRR;Long Term: Able to use THRR to govern intensity when exercising independently;Short Term: Able to use daily as guideline for intensity in rehab     Understanding of Exercise Prescription  Yes  Yes  Yes     Intervention  Provide education, explanation, and written materials on patient's individual exercise prescription  Provide education, explanation, and written materials on patient's individual exercise prescription  Provide education, explanation, and written materials on patient's individual exercise prescription     Expected Outcomes  Short Term: Able to explain program exercise prescription;Long Term: Able to explain home exercise prescription to exercise independently  Short Term: Able to explain program exercise prescription;Long Term: Able to explain home exercise prescription to exercise independently  Short Term: Able to explain program exercise prescription;Long Term: Able to explain home exercise prescription to exercise independently        Exercise Goals Re-Evaluation : Exercise Goals Re-Evaluation    Row Name 03/12/20 0811 04/09/20 0743           Exercise Goal Re-Evaluation   Exercise Goals Review  Increase Physical Activity;Increase Strength and Stamina;Able to understand and use rate of perceived exertion (RPE) scale;Able to understand and use Dyspnea scale;Knowledge and understanding of Target Heart Rate Range (THRR);Understanding of Exercise Prescription  Increase Physical Activity;Increase  Strength and Stamina;Able to understand and use rate of perceived exertion (RPE) scale;Able to understand and use Dyspnea scale;Knowledge and understanding of Target Heart Rate Range (THRR);Understanding of Exercise Prescription      Comments  Pt has attended 3 exercise sessions. I expect that she will progress well. She is currently exercising at 2.3 METs on the stepper. Will continue to monitor and progress as able.  Pt has attended 9 exercise sessions. She is currently exercising at 2.5 METs on the stepper. Will continue to monitor and progress as able.      Expected Outcomes  Through exercise at rehab and at home, patient will increase endurance and strength. Patient will also be able to perform ADL's with less shortness of breath and fatigue.  Through exercise at rehab and at home, patient will increase endurance and strength. Patient will also be able to perform ADL's with less shortness of breath and fatigue.         Discharge Exercise Prescription (Final Exercise Prescription Changes): Exercise Prescription Changes - 04/09/20 1400      Response to Exercise   Blood Pressure (Admit)  104/60    Blood Pressure (Exercise)  134/60    Blood Pressure (Exit)  106/60    Heart Rate (Admit)  76 bpm    Heart Rate (Exercise)  94 bpm    Heart Rate (Exit)  79 bpm    Oxygen Saturation (Admit)  95 %    Oxygen Saturation (Exercise)  93 %    Oxygen Saturation (Exit)  79 %    Rating of Perceived Exertion (Exercise)  15    Perceived Dyspnea (Exercise)  3    Duration  Continue with 30 min of aerobic exercise without signs/symptoms of physical distress.    Intensity  THRR unchanged      Progression   Progression  Continue to progress workloads to maintain intensity without signs/symptoms of physical distress.      Resistance Training   Training Prescription  Yes    Weight  orange bands    Reps  10-15    Time  10 Minutes      Interval Training   Interval Training  No      Treadmill   MPH  1.7     Grade  1    Minutes  15      NuStep   Level  4    SPM  80    Minutes  15    METs  2.5       Nutrition:  Target Goals: Understanding of nutrition guidelines, daily intake of sodium <1546m, cholesterol <2042m calories 30% from fat and 7% or less from saturated fats, daily to have 5 or more servings of fruits and vegetables.  Biometrics: Pre Biometrics - 02/20/20 1448      Pre Biometrics   Height  5' (1.524 m)    Weight  146 lb 9.7 oz (66.5 kg)    BMI (Calculated)  28.63    Grip Strength  21.5 kg        Nutrition Therapy Plan and Nutrition Goals: Nutrition Therapy & Goals - 03/05/20 1421      Nutrition Therapy   Diet  Carb Modified, Mediterranean      Personal Nutrition Goals   Nutrition  Goal  Identify food quantities necessary to achieve wt loss of  -1# per week to a goal wt loss of 4-10 lb at graduation from pulmonary rehab.      Intervention Plan   Intervention  Prescribe, educate and counsel regarding individualized specific dietary modifications aiming towards targeted core components such as weight, hypertension, lipid management, diabetes, heart failure and other comorbidities.;Nutrition handout(s) given to patient.    Expected Outcomes  Long Term Goal: Adherence to prescribed nutrition plan.       Nutrition Assessments: Nutrition Assessments - 03/05/20 1013      Rate Your Plate Scores   Pre Score  68       Nutrition Goals Re-Evaluation: Nutrition Goals Re-Evaluation    Row Name 03/05/20 1424             Goals   Current Weight  146 lb (66.2 kg)       Nutrition Goal  Identify food quantities necessary to achieve wt loss of  -1# per week to a goal wt loss of 4-10 lb at graduation from pulmonary rehab.          Nutrition Goals Discharge (Final Nutrition Goals Re-Evaluation): Nutrition Goals Re-Evaluation - 03/05/20 1424      Goals   Current Weight  146 lb (66.2 kg)    Nutrition Goal  Identify food quantities necessary to achieve wt loss of   -1# per week to a goal wt loss of 4-10 lb at graduation from pulmonary rehab.       Psychosocial: Target Goals: Acknowledge presence or absence of significant depression and/or stress, maximize coping skills, provide positive support system. Participant is able to verbalize types and ability to use techniques and skills needed for reducing stress and depression.  Initial Review & Psychosocial Screening: Initial Psych Review & Screening - 02/20/20 1453      Initial Review   Current issues with  None Identified      Family Dynamics   Good Support System?  Yes      Barriers   Psychosocial barriers to participate in program  There are no identifiable barriers or psychosocial needs.      Screening Interventions   Interventions  Encouraged to exercise       Quality of Life Scores:  Scores of 19 and below usually indicate a poorer quality of life in these areas.  A difference of  2-3 points is a clinically meaningful difference.  A difference of 2-3 points in the total score of the Quality of Life Index has been associated with significant improvement in overall quality of life, self-image, physical symptoms, and general health in studies assessing change in quality of life.  PHQ-9: Recent Review Flowsheet Data    Depression screen [facility] 2/9 02/20/2020 09/03/2017 08/21/2015   Decreased Interest 0 0 0   Down, Depressed, Hopeless 0 0 0   PHQ - 2 Score 0 0 0   Altered sleeping 0 - -   Tired, decreased energy 0 - -   Change in appetite 0 - -   Feeling bad or failure about yourself  0 - -   Trouble concentrating 0 - -   Moving slowly or fidgety/restless 0 - -   Suicidal thoughts 0 - -   PHQ-9 Score 0 - -   Difficult doing work/chores Not difficult at all - -     Interpretation of Total Score  Total Score Depression Severity:  1-4 = Minimal depression, 5-9 = Mild depression, 10-14 =  Moderate depression, 15-19 = Moderately severe depression, 20-27 = Severe depression   Psychosocial  Evaluation and Intervention: Psychosocial Evaluation - 02/20/20 1453      Psychosocial Evaluation & Interventions   Interventions  Encouraged to exercise with the program and follow exercise prescription    Continue Psychosocial Services   No Follow up required       Psychosocial Re-Evaluation: Psychosocial Re-Evaluation    [place] Name 03/12/20 0916 04/09/20 0920           Psychosocial Re-Evaluation   Current issues with  None Identified  None Identified      Comments  No barriers or concerns identified.  No psychosocial concerns identified at this time.      Expected Outcomes  For Rhonda [name] to be free of psychosocial concerns while participating in pulmonary rehab.  For patient to be free of barriers or psychosocial concerns while participating in pulmonary rehab.      Interventions  Encouraged to attend Pulmonary Rehabilitation for the exercise  Encouraged to attend Pulmonary Rehabilitation for the exercise      Continue Psychosocial Services   No Follow up required  No Follow up required         Psychosocial Discharge (Final Psychosocial Re-Evaluation): Psychosocial Re-Evaluation - 04/09/20 0920      Psychosocial Re-Evaluation   Current issues with  None Identified    Comments  No psychosocial concerns identified at this time.    Expected Outcomes  For patient to be free of barriers or psychosocial concerns while participating in pulmonary rehab.    Interventions  Encouraged to attend Pulmonary Rehabilitation for the exercise    Continue Psychosocial Services   No Follow up required       Education: Education Goals: Education classes will be provided on a weekly basis, covering required topics. Participant will state understanding/return demonstration of topics presented.  Learning Barriers/Preferences: Learning Barriers/Preferences - 02/20/20 1454      Learning Barriers/Preferences   Learning Barriers  None    Learning Preferences  Individual Instruction;Skilled  Demonstration;Verbal Instruction       Education Topics: Risk Factor Reduction:  -Group instruction that is supported by a PowerPoint presentation. Instructor discusses the definition of a risk factor, different risk factors for pulmonary disease, and how the heart and lungs work together.     PULMONARY REHAB OTHER RESPIRATORY from 04/02/2020 in [place]  Date  02/29/20  Educator  DF  Instruction Review Code  2- Demonstrated Understanding      Nutrition for Pulmonary Patient:  -Group instruction provided by PowerPoint slides, verbal discussion, and written materials to support subject matter. The instructor gives an explanation and review of healthy diet recommendations, which includes a discussion on weight management, recommendations for fruit and vegetable consumption, as well as protein, fluid, caffeine, fiber, sodium, sugar, and alcohol. Tips for eating when patients are short of breath are discussed.   Pursed Lip Breathing:  -Group instruction that is supported by demonstration and informational handouts. Instructor discusses the benefits of pursed lip and diaphragmatic breathing and detailed demonstration on how to preform both.     PULMONARY REHAB OTHER RESPIRATORY from 04/02/2020 in [place]  Date  03/14/20  Educator  Handout      Oxygen Safety:  -Group instruction provided by PowerPoint, verbal discussion, and written material to support subject matter. There is an overview of "What is Oxygen" and "Why do we need it".  Instructor also reviews how to  create a safe environment for oxygen use, the importance of using oxygen as prescribed, and the risks of noncompliance. There is a brief discussion on traveling with oxygen and resources the patient may utilize.   PULMONARY REHAB OTHER RESPIRATORY from 01/13/2018 in [place]  Date  12/30/17  Educator  RN  Instruction Review Code  2-  Demonstrated Understanding      Oxygen Equipment:  -Group instruction provided by [facility] Staff utilizing handouts, written materials, and equipment demonstrations.   PULMONARY REHAB OTHER RESPIRATORY from 01/13/2018 in [place]  Date  01/06/18  Educator  Ace Gins      Signs and Symptoms:  -Group instruction provided by written material and verbal discussion to support subject matter. Warning signs and symptoms of infection, stroke, and heart attack are reviewed and when to call the physician/911 reinforced. Tips for preventing the spread of infection discussed.   Advanced Directives:  -Group instruction provided by verbal instruction and written material to support subject matter. Instructor reviews Advanced Directive laws and proper instruction for filling out document.   Pulmonary Video:  -Group video education that reviews the importance of medication and oxygen compliance, exercise, good nutrition, pulmonary hygiene, and pursed lip and diaphragmatic breathing for the pulmonary patient.   Exercise for the Pulmonary Patient:  -Group instruction that is supported by a PowerPoint presentation. Instructor discusses benefits of exercise, core components of exercise, frequency, duration, and intensity of an exercise routine, importance of utilizing pulse oximetry during exercise, safety while exercising, and options of places to exercise outside of rehab.     PULMONARY REHAB OTHER RESPIRATORY from 01/13/2018 in [place]  Date  01/13/18  Educator  EP  Instruction Review Code  1- Verbalizes Understanding      Pulmonary Medications:  -Verbally interactive group education provided by instructor with focus on inhaled medications and proper administration.   Anatomy and Physiology of the Respiratory System and Intimacy:  -Group instruction provided by PowerPoint, verbal discussion, and written material to support  subject matter. Instructor reviews respiratory cycle and anatomical components of the respiratory system and their functions. Instructor also reviews differences in obstructive and restrictive respiratory diseases with examples of each. Intimacy, Sex, and Sexuality differences are reviewed with a discussion on how relationships can change when diagnosed with pulmonary disease. Common sexual concerns are reviewed.   PULMONARY REHAB OTHER RESPIRATORY from 04/02/2020 in [place]  Date  04/02/20  Educator  Handout      MD DAY -A group question and answer session with a medical doctor that allows participants to ask questions that relate to their pulmonary disease state.   PULMONARY REHAB OTHER RESPIRATORY from 01/13/2018 in [place]  Date  12/02/17  Educator  Dr. Nelda Marseille      OTHER EDUCATION -Group or individual verbal, written, or video instructions that support the educational goals of the pulmonary rehab program.   PULMONARY REHAB OTHER RESPIRATORY from 04/02/2020 in [place]  Date  03/26/20  Educator  Mittie Bodo your numbers]  Instruction Review Code  -- [na]      Holiday Eating Survival Tips:  -Group instruction provided by PowerPoint slides, verbal discussion, and written materials to support subject matter. The instructor gives patients tips, tricks, and techniques to help them not only survive but enjoy the holidays despite the onslaught of food that accompanies the holidays.  Knowledge Questionnaire Score: Knowledge Questionnaire Score - 02/20/20 1510      Knowledge Questionnaire Score   Pre Score  18/18       Core Components/Risk Factors/Patient Goals at Admission: Personal Goals and Risk Factors at Admission - 02/20/20 1455      Core Components/Risk Factors/Patient Goals on Admission   Improve shortness of breath with ADL's  Yes    Intervention  Provide education,  individualized exercise plan and daily activity instruction to help decrease symptoms of SOB with activities of daily living.    Expected Outcomes  Short Term: Improve cardiorespiratory fitness to achieve a reduction of symptoms when performing ADLs;Long Term: Be able to perform more ADLs without symptoms or delay the onset of symptoms       Core Components/Risk Factors/Patient Goals Review:  Goals and Risk Factor Review    Row Name 02/20/20 1456 03/12/20 0917 04/09/20 0921         Core Components/Risk Factors/Patient Goals Review   Personal Goals Review  Develop more efficient breathing techniques such as purse lipped breathing and diaphragmatic breathing and practicing self-pacing with activity.;Increase knowledge of respiratory medications and ability to use respiratory devices properly.;Improve shortness of breath with ADL's  Develop more efficient breathing techniques such as purse lipped breathing and diaphragmatic breathing and practicing self-pacing with activity.;Increase knowledge of respiratory medications and ability to use respiratory devices properly.;Improve shortness of breath with ADL's  Improve shortness of breath with ADL's;Develop more efficient breathing techniques such as purse lipped breathing and diaphragmatic breathing and practicing self-pacing with activity.;Increase knowledge of respiratory medications and ability to use respiratory devices properly.     Review  --  She is well known to pulmonary rehab, third time in program, progressing well, does not require supplemental oxygen, treadmill @ 1.7 mph/0 incline, level 3 on nustep.  Rhonda [name] is doing well exercising without supplemental oxygen, is walking on the treadmill @ 1.7 mph and 1% incline, and level 4 of the nustep @ 2.5 mets.     Expected Outcomes  --  See admission goals.  See admission goals.        Core Components/Risk Factors/Patient Goals at Discharge (Final Review):  Goals and Risk Factor Review - 04/09/20 0921       Core Components/Risk Factors/Patient Goals Review   Personal Goals Review  Improve shortness of breath with ADL's;Develop more efficient breathing techniques such as purse lipped breathing and diaphragmatic breathing and practicing self-pacing with activity.;Increase knowledge of respiratory medications and ability to use respiratory devices properly.    Review  Rhonda [name] is doing well exercising without supplemental oxygen, is walking on the treadmill @ 1.7 mph and 1% incline, and level 4 of the nustep @ 2.5 mets.    Expected Outcomes  See admission goals.       ITP Comments:   Comments: ITP REVIEW Pt is making expected progress toward pulmonary rehab goals after completing 10 sessions. Recommend continued exercise, life style modification, education, and utilization of breathing techniques to increase stamina and strength and decrease shortness of breath with exertion.

## 2020-04-11 ENCOUNTER — Other Ambulatory Visit: Payer: Self-pay

## 2020-04-11 ENCOUNTER — Encounter (HOSPITAL_COMMUNITY)
Admission: RE | Admit: 2020-04-11 | Discharge: 2020-04-11 | Disposition: A | Discharge status: Home / Self Care | Payer: Medicare Other | Source: Ambulatory Visit | Attending: Internal Medicine | Admitting: Internal Medicine

## 2020-04-11 DIAGNOSIS — J849 Interstitial pulmonary disease, unspecified: Secondary | ICD-10-CM | POA: Diagnosis not present

## 2020-04-11 NOTE — Progress Notes (Signed)
Daily Session Note  Patient Details  Name: Rhonda [name] MRN: [id number] Date of Birth: Jul 09, 1951 Referring Provider:     Pulmonary Rehab Walk Test from 02/20/2020 in [place]  Referring Provider  Dr. Chase Caller      Encounter Date: 04/11/2020  Check In: Session Check In - 04/11/20 1301      Check-In   Supervising physician immediately available to respond to emergencies  [facility] immediately available    Physician(s)  Dr. Tyrell Antonio    Location  [facility]    Staff Present  Hoy Register, MS, CEP, Exercise Physiologist;Aldred Mase Ysidro Evert, RN    Virtual Visit  No    Medication changes reported      No    Fall or balance concerns reported     No    Tobacco Cessation  No Change    Warm-up and Cool-down  Performed on first and last piece of equipment    Resistance Training Performed  Yes    VAD Patient?  No    PAD/SET Patient?  No      Pain Assessment   Currently in Pain?  No/denies    Multiple Pain Sites  No       Capillary Blood Glucose: No results found for this or any previous visit (from the past 24 hour(s)).    Social History   Tobacco Use  Smoking Status Never Smoker  Smokeless Tobacco Never Used  Tobacco Comment   pt states she experimented in college    Goals Met:  Exercise tolerated well No report of cardiac concerns or symptoms Strength training completed today  Goals Unmet:  Not Applicable  Comments:Service time is from 1250 to 1350     Dr. Fransico Him is Medical Director for Cardiac Rehab at [facility].

## 2020-04-12 ENCOUNTER — Other Ambulatory Visit (HOSPITAL_COMMUNITY): Payer: Medicare Other

## 2020-04-16 ENCOUNTER — Encounter (HOSPITAL_COMMUNITY)
Admission: RE | Admit: 2020-04-16 | Discharge: 2020-04-16 | Disposition: A | Discharge status: Home / Self Care | Payer: Medicare Other | Source: Ambulatory Visit | Attending: Internal Medicine | Admitting: Internal Medicine

## 2020-04-16 ENCOUNTER — Other Ambulatory Visit: Payer: Self-pay

## 2020-04-16 DIAGNOSIS — J849 Interstitial pulmonary disease, unspecified: Secondary | ICD-10-CM

## 2020-04-16 NOTE — Research (Signed)
Title: Chronic Fibrosing Interstitial Lung Disease with Progressive Phenotype Prospective Outcomes (ILD-PRO) Registry   Protocol #: IPF-PRO-SUB, Clinical Trials # [id number], Sponsor: [employer]  Protocol Version Amendment 4 dated 12Sep2019  and confirmed current on 04/16/2020 Consent Version for today's visit date of 04/16/2020  is Version Amendment 4 ([id number])  Clinical Research Coordinator / Research RN note : This visit for Subject Rhonda [name] with DOB: 1950/11/29 on 04/16/2020 for the above protocol is Visit/Encounter # 24 Month Follow-up  and is for purpose of research. The consent for this encounter is under Protocol Version Amendment 4 [id number]) and is currently IRB approved. Subject met with the research assistants listed below in the office to discuss the reconsent to Consent to Take Part in a Clinical Research Study and Authorization to [place]. Verline Lema, RA with Gwendolyn Grant, RA, participating for training purposes, went over the entire reconsent with the subject and the subject was given ample time to read and review the reconsent. After review of the reconsent, all questions were answered to the subject's satisfaction. Dr.Murali Ramaswamy, Games developer, reviewed the reconsent with the patient, explained the purpose of research, and asked if there were any additional questions. She stated that the research assistants explained the study to her thoroughly and stated she had no additional questions. The subject, delegated research assistant, and Private Investigator signed the reconsent and the subject was given a signed copy of the reconsent for her records. After reconsent, the regularly scheduled blood draw and questionnaires were completed. Refer to the subject's paper source binder for further details.  Subject expressed continued interest and consent in continuing as a study subject. Subject confirmed that there was no change  in contact information (e.g. address, telephone, email). Subject thanked for participation in research and contribution to science.   Staff Present: Verline Lema, RA: Gwendolyn Grant, RA: and Brand Males, MD, PI  Signed by [place] Assistant [facility]  [place], [place] 10:40 AM 04/16/2020

## 2020-04-16 NOTE — Progress Notes (Signed)
Daily Session Note  Patient Details  Name: Rhonda [name] MRN: [id number] Date of Birth: Jul 13, 1951 Referring Provider:     Pulmonary Rehab Walk Test from 02/20/2020 in [place]  Referring Provider  Dr. Chase Caller      Encounter Date: 04/16/2020  Check In: Session Check In - 04/16/20 1301      Check-In   Supervising physician immediately available to respond to emergencies  [facility] immediately available    Physician(s)  Dr. Tyrell Antonio    Location  [facility]    Staff Present  Hoy Register, MS, CEP, Exercise Physiologist;Joan Leonia Reeves, RN, Roque Cash, RN    Virtual Visit  No    Medication changes reported      No    Fall or balance concerns reported     No    Tobacco Cessation  No Change    Warm-up and Cool-down  Performed on first and last piece of equipment    Resistance Training Performed  Yes    VAD Patient?  No    PAD/SET Patient?  No      Pain Assessment   Currently in Pain?  No/denies    Multiple Pain Sites  No       Capillary Blood Glucose: No results found for this or any previous visit (from the past 24 hour(s)).    Social History   Tobacco Use  Smoking Status Never Smoker  Smokeless Tobacco Never Used  Tobacco Comment   pt states she experimented in college    Goals Met:  Exercise tolerated well No report of cardiac concerns or symptoms Strength training completed today  Goals Unmet:  Not Applicable  Comments: Service time is from 1250 to 1350    Dr. Fransico Him is Medical Director for Cardiac Rehab at [facility].

## 2020-04-18 ENCOUNTER — Encounter (HOSPITAL_COMMUNITY)
Admission: RE | Admit: 2020-04-18 | Discharge: 2020-04-18 | Disposition: A | Discharge status: Home / Self Care | Payer: Medicare Other | Source: Ambulatory Visit | Attending: Internal Medicine | Admitting: Internal Medicine

## 2020-04-18 ENCOUNTER — Other Ambulatory Visit: Payer: Self-pay

## 2020-04-18 VITALS — Wt 140.9 lb

## 2020-04-18 DIAGNOSIS — J849 Interstitial pulmonary disease, unspecified: Secondary | ICD-10-CM | POA: Diagnosis not present

## 2020-04-18 NOTE — Progress Notes (Signed)
Daily Session Note  Patient Details  Name: Rhonda [name] MRN: [id number] Date of Birth: May 07, 1951 Referring Provider:     Pulmonary Rehab Walk Test from 02/20/2020 in [place]  Referring Provider  Dr. Chase Caller      Encounter Date: 04/18/2020  Check In: Session Check In - 04/18/20 1427      Check-In   Supervising physician immediately available to respond to emergencies  [facility] immediately available    Physician(s)  Dr. Broadus [name]    Location  [facility]    Staff Present  Hoy Register, MS, CEP, Exercise Physiologist;Joan Leonia Reeves, RN, Roque Cash, RN    Virtual Visit  No    Medication changes reported      No    Fall or balance concerns reported     No    Tobacco Cessation  No Change    Warm-up and Cool-down  Performed on first and last piece of equipment    Resistance Training Performed  Yes    VAD Patient?  No    PAD/SET Patient?  No      Pain Assessment   Currently in Pain?  No/denies    Multiple Pain Sites  No       Capillary Blood Glucose: No results found for this or any previous visit (from the past 24 hour(s)).    Social History   Tobacco Use  Smoking Status Never Smoker  Smokeless Tobacco Never Used  Tobacco Comment   pt states she experimented in college    Goals Met:  Exercise tolerated well No report of cardiac concerns or symptoms Strength training completed today  Goals Unmet:  Not Applicable  Comments: Service time is from 1250 to 1352    Dr. Fransico Him is Medical Director for Cardiac Rehab at [facility].

## 2020-04-23 ENCOUNTER — Other Ambulatory Visit: Payer: Self-pay

## 2020-04-23 ENCOUNTER — Encounter (HOSPITAL_COMMUNITY)
Admission: RE | Admit: 2020-04-23 | Discharge: 2020-04-23 | Disposition: A | Discharge status: Home / Self Care | Payer: Medicare Other | Source: Ambulatory Visit | Attending: Internal Medicine | Admitting: Internal Medicine

## 2020-04-23 DIAGNOSIS — J849 Interstitial pulmonary disease, unspecified: Secondary | ICD-10-CM | POA: Insufficient documentation

## 2020-04-23 NOTE — Progress Notes (Deleted)
Daily Session Note  Patient Details  Name: Rhonda [name] MRN: [id number] Date of Birth: [birth year] Referring Provider:     Pulmonary Rehab Walk Test from 02/20/2020 in [place]  Referring Provider  Dr. Chase Caller      Encounter Date: 04/18/2020  Check In:   Capillary Blood Glucose: No results found for this or any previous visit (from the past 24 hour(s)). POCT Glucose - 04/23/20 1517      POCT Blood Glucose   Pre-Exercise #2  119 mg/dL    Post-Exercise #2  125 mg/dL      Exercise Prescription Changes - 04/23/20 1500      Response to Exercise   Blood Pressure (Admit)  110/60    Blood Pressure (Exercise)  126/66    Blood Pressure (Exit)  114/62    Heart Rate (Admit)  64 bpm    Heart Rate (Exercise)  81 bpm    Heart Rate (Exit)  67 bpm    Oxygen Saturation (Admit)  100 %    Oxygen Saturation (Exercise)  98 %    Oxygen Saturation (Exit)  98 %    Rating of Perceived Exertion (Exercise)  12    Perceived Dyspnea (Exercise)  2    Duration  Continue with 30 min of aerobic exercise without signs/symptoms of physical distress.    Intensity  THRR unchanged      Progression   Progression  Continue to progress workloads to maintain intensity without signs/symptoms of physical distress.      Resistance Training   Training Prescription  Yes    Weight  orange bands    Reps  10-15    Time  10 Minutes      Interval Training   Interval Training  No      Treadmill   MPH  1.7    Grade  1    Minutes  15      NuStep   Level  4    SPM  80    Minutes  15    METs  2.6       Social History   Tobacco Use  Smoking Status Never Smoker  Smokeless Tobacco Never Used  Tobacco Comment   pt states she experimented in college    Goals Met:  Independence with exercise equipment Exercise tolerated well Strength training completed today  Goals Unmet:  Not Applicable  Comments: Service time is from 1245 to 1351    Dr. Fransico Him is  Medical Director for Cardiac Rehab at [facility].

## 2020-04-23 NOTE — Progress Notes (Signed)
Daily Session Note  Patient Details  Name: Rhonda [name] MRN: [id number] Date of Birth: June 06, 1951 Referring Provider:     Pulmonary Rehab Walk Test from 02/20/2020 in [place]  Referring Provider  Dr. Chase Caller      Encounter Date: 04/23/2020  Check In: Session Check In - 04/23/20 1253      Check-In   Supervising physician immediately available to respond to emergencies  [facility] immediately available    Physician(s)  Dr. Broadus [name]    Location  [facility]    Staff Present  Hoy Register, MS, CEP, Exercise Physiologist;Joan Leonia Reeves, RN, BSN;David Makemson MS, EP-C, CCRP    Virtual Visit  No    Medication changes reported      No    Fall or balance concerns reported     No    Tobacco Cessation  No Change    Warm-up and Cool-down  Performed on first and last piece of equipment    Resistance Training Performed  Yes    VAD Patient?  No    PAD/SET Patient?  No      Pain Assessment   Currently in Pain?  No/denies    Multiple Pain Sites  No       Capillary Blood Glucose: No results found for this or any previous visit (from the past 24 hour(s)).    Social History   Tobacco Use  Smoking Status Never Smoker  Smokeless Tobacco Never Used  Tobacco Comment   pt states she experimented in college    Goals Met:  Independence with exercise equipment Exercise tolerated well Strength training completed today  Goals Unmet:  Not Applicable  Comments: Service time is from 1245 to 1351    Dr. Fransico Him is Medical Director for Cardiac Rehab at [facility].

## 2020-04-24 ENCOUNTER — Ambulatory Visit: Payer: Medicare Other | Admitting: Allergy

## 2020-04-25 ENCOUNTER — Other Ambulatory Visit: Payer: Self-pay

## 2020-04-25 ENCOUNTER — Encounter (HOSPITAL_COMMUNITY)
Admission: RE | Admit: 2020-04-25 | Discharge: 2020-04-25 | Disposition: A | Discharge status: Home / Self Care | Payer: Medicare Other | Source: Ambulatory Visit | Attending: Internal Medicine | Admitting: Internal Medicine

## 2020-04-25 DIAGNOSIS — J849 Interstitial pulmonary disease, unspecified: Secondary | ICD-10-CM

## 2020-05-13 NOTE — Progress Notes (Signed)
Discharge Progress Report  Patient Details  Name: Rhonda [name] MRN: [id number] Date of Birth: 1951-04-16 Referring Provider:     Pulmonary Rehab Walk Test from 02/20/2020 in [place]  Referring Provider Dr. Chase Caller       Number of Visits: 15  Reason for Discharge:  Patient reached a stable level of exercise. Patient independent in their exercise. Patient has met program and personal goals.  Smoking History:  Social History   Tobacco Use  Smoking Status Never Smoker  Smokeless Tobacco Never Used  Tobacco Comment   pt states she experimented in college    Diagnosis:  Interstitial lung disease ([place])  ADL [facility]:  Pulmonary Assessment Scores    Row Name 02/20/20 1510 02/20/20 1531 04/25/20 1436     ADL [facility]   ADL Phase Entry (P)  Entry Exit   SOB Score total -- -- 28     CAT Score   CAT Score 17 (P)  -- 13     mMRC Score   mMRC Score -- 2 2          Initial Exercise Prescription:  Initial Exercise Prescription - 02/20/20 1500      Date of Initial Exercise RX and Referring Provider   Date 02/20/20    Referring Provider Dr. Chase Caller      Treadmill   MPH 2    Grade 0    Minutes 15      NuStep   Level 3    SPM 80    Minutes 15      Prescription Details   Frequency (times per week) 2    Duration Progress to 30 minutes of continuous aerobic without signs/symptoms of physical distress      Intensity   THRR 40-80% of Max Heartrate 60-120    Ratings of Perceived Exertion 11-13    Perceived Dyspnea 0-4      Progression   Progression Continue to progress workloads to maintain intensity without signs/symptoms of physical distress.      Resistance Training   Training Prescription Yes    Weight orange bands    Reps 10-15           Discharge Exercise Prescription (Final Exercise Prescription Changes):  Exercise Prescription Changes - 04/23/20 1500      Response to Exercise   Blood Pressure (Admit) 110/60     Blood Pressure (Exercise) 126/66    Blood Pressure (Exit) 114/62    Heart Rate (Admit) 64 bpm    Heart Rate (Exercise) 81 bpm    Heart Rate (Exit) 67 bpm    Oxygen Saturation (Admit) 100 %    Oxygen Saturation (Exercise) 98 %    Oxygen Saturation (Exit) 98 %    Rating of Perceived Exertion (Exercise) 12    Perceived Dyspnea (Exercise) 2    Duration Continue with 30 min of aerobic exercise without signs/symptoms of physical distress.    Intensity THRR unchanged      Progression   Progression Continue to progress workloads to maintain intensity without signs/symptoms of physical distress.      Resistance Training   Training Prescription Yes    Weight orange bands    Reps 10-15    Time 10 Minutes      Interval Training   Interval Training No      Treadmill   MPH 1.7    Grade 1    Minutes 15      NuStep   Level 4  SPM 80    Minutes 15    METs 2.6           Functional Capacity:  6 Minute Walk    Row Name 02/20/20 1532 04/25/20 1501       6 Minute Walk   Phase -- Discharge    Distance 1257 feet 1267 feet    Distance % Change -- 0.8 %    Distance Feet Change -- 10 ft    Walk Time 6 minutes 6 minutes    # of Rest Breaks 0 0    MPH 2.38 2.4    METS 2.51 2.58    RPE 12 12    Perceived Dyspnea  2 2    VO2 Peak 8.79 9.04    Symptoms Yes (comment) No    Comments L hip pain 1/10 --    Resting HR 75 bpm 74 bpm    Resting BP 114/66 120/70    Resting Oxygen Saturation  100 % 99 %    Exercise Oxygen Saturation  during 6 min walk 93 % 90 %    Max Ex. HR 94 bpm 92 bpm    Max Ex. BP 122/74 134/70    2 Minute Post BP 108/70 116/68      Interval HR   1 Minute HR 85 74    2 Minute HR 92 86    3 Minute HR 94 91    4 Minute HR 92 92    5 Minute HR 90 92    6 Minute HR 82 91    2 Minute Post HR 71 95    Interval Heart Rate? Yes Yes      Interval Oxygen   Interval Oxygen? Yes Yes    Baseline Oxygen Saturation % 100 % 99 %    1 Minute Oxygen Saturation % 99 %  94 %    1 Minute Liters of Oxygen 0 L 0 L    2 Minute Oxygen Saturation % 94 % 92 %    2 Minute Liters of Oxygen 0 L 0 L    3 Minute Oxygen Saturation % 93 % 90 %    3 Minute Liters of Oxygen 0 L 0 L    4 Minute Oxygen Saturation % 95 % 90 %    4 Minute Liters of Oxygen 0 L 0 L    5 Minute Oxygen Saturation % 94 % 91 %    5 Minute Liters of Oxygen 0 L 0 L    6 Minute Oxygen Saturation % 94 % 90 %    6 Minute Liters of Oxygen 0 L 0 L    2 Minute Post Oxygen Saturation % 98 % 95 %    2 Minute Post Liters of Oxygen 0 L 0 L           Psychological, QOL, Others - Outcomes: PHQ 2/9: Depression screen [facility] 2/9 02/20/2020 09/03/2017 08/21/2015  Decreased Interest 0 0 0  Down, Depressed, Hopeless 0 0 0  PHQ - 2 Score 0 0 0  Altered sleeping 0 - -  Tired, decreased energy 0 - -  Change in appetite 0 - -  Feeling bad or failure about yourself  0 - -  Trouble concentrating 0 - -  Moving slowly or fidgety/restless 0 - -  Suicidal thoughts 0 - -  PHQ-9 Score 0 - -  Difficult doing work/chores Not difficult at all - -  Some recent data might be hidden  Quality of Life:   Personal Goals: Goals established at orientation with interventions provided to work toward goal.  Personal Goals and Risk Factors at Admission - 02/20/20 1455      Core Components/Risk Factors/Patient Goals on Admission   Improve shortness of breath with ADL's Yes    Intervention Provide education, individualized exercise plan and daily activity instruction to help decrease symptoms of SOB with activities of daily living.    Expected Outcomes Short Term: Improve cardiorespiratory fitness to achieve a reduction of symptoms when performing ADLs;Long Term: Be able to perform more ADLs without symptoms or delay the onset of symptoms            Personal Goals Discharge:  Goals and Risk Factor Review    Row Name 02/20/20 1456 03/12/20 0917 04/09/20 0921         Core Components/Risk Factors/Patient Goals Review    Personal Goals Review Develop more efficient breathing techniques such as purse lipped breathing and diaphragmatic breathing and practicing self-pacing with activity.;Increase knowledge of respiratory medications and ability to use respiratory devices properly.;Improve shortness of breath with ADL's Develop more efficient breathing techniques such as purse lipped breathing and diaphragmatic breathing and practicing self-pacing with activity.;Increase knowledge of respiratory medications and ability to use respiratory devices properly.;Improve shortness of breath with ADL's Improve shortness of breath with ADL's;Develop more efficient breathing techniques such as purse lipped breathing and diaphragmatic breathing and practicing self-pacing with activity.;Increase knowledge of respiratory medications and ability to use respiratory devices properly.     Review -- She is well known to pulmonary rehab, third time in program, progressing well, does not require supplemental oxygen, treadmill @ 1.7 mph/0 incline, level 3 on nustep. [name] is doing well exercising without supplemental oxygen, is walking on the treadmill @ 1.7 mph and 1% incline, and level 4 of the nustep @ 2.5 mets.     Expected Outcomes -- See admission goals. See admission goals.            Exercise Goals and Review:  Exercise Goals    Row Name 02/20/20 1541 03/12/20 0811 04/09/20 0743         Exercise Goals   Increase Physical Activity Yes Yes Yes     Intervention Provide advice, education, support and counseling about physical activity/exercise needs.;Develop an individualized exercise prescription for aerobic and resistive training based on initial evaluation findings, risk stratification, comorbidities and participant's personal goals. Provide advice, education, support and counseling about physical activity/exercise needs.;Develop an individualized exercise prescription for aerobic and resistive training based on initial evaluation  findings, risk stratification, comorbidities and participant's personal goals. Provide advice, education, support and counseling about physical activity/exercise needs.;Develop an individualized exercise prescription for aerobic and resistive training based on initial evaluation findings, risk stratification, comorbidities and participant's personal goals.     Expected Outcomes Short Term: Attend rehab on a regular basis to increase amount of physical activity.;Long Term: Add in home exercise to make exercise part of routine and to increase amount of physical activity.;Long Term: Exercising regularly at least 3-5 days a week. Short Term: Attend rehab on a regular basis to increase amount of physical activity.;Long Term: Add in home exercise to make exercise part of routine and to increase amount of physical activity.;Long Term: Exercising regularly at least 3-5 days a week. Short Term: Attend rehab on a regular basis to increase amount of physical activity.;Long Term: Add in home exercise to make exercise part of routine and to increase amount of physical activity.;Long Term:   Exercising regularly at least 3-5 days a week.     Increase Strength and Stamina Yes Yes Yes     Intervention Provide advice, education, support and counseling about physical activity/exercise needs.;Develop an individualized exercise prescription for aerobic and resistive training based on initial evaluation findings, risk stratification, comorbidities and participant's personal goals. Provide advice, education, support and counseling about physical activity/exercise needs.;Develop an individualized exercise prescription for aerobic and resistive training based on initial evaluation findings, risk stratification, comorbidities and participant's personal goals. Provide advice, education, support and counseling about physical activity/exercise needs.;Develop an individualized exercise prescription for aerobic and resistive training based on  initial evaluation findings, risk stratification, comorbidities and participant's personal goals.     Expected Outcomes Short Term: Increase workloads from initial exercise prescription for resistance, speed, and METs.;Short Term: Perform resistance training exercises routinely during rehab and add in resistance training at home;Long Term: Improve cardiorespiratory fitness, muscular endurance and strength as measured by increased METs and functional capacity (6MWT) Short Term: Increase workloads from initial exercise prescription for resistance, speed, and METs.;Short Term: Perform resistance training exercises routinely during rehab and add in resistance training at home;Long Term: Improve cardiorespiratory fitness, muscular endurance and strength as measured by increased METs and functional capacity (6MWT) Short Term: Increase workloads from initial exercise prescription for resistance, speed, and METs.;Short Term: Perform resistance training exercises routinely during rehab and add in resistance training at home;Long Term: Improve cardiorespiratory fitness, muscular endurance and strength as measured by increased METs and functional capacity (6MWT)     Able to understand and use rate of perceived exertion (RPE) scale Yes Yes Yes     Intervention Provide education and explanation on how to use RPE scale Provide education and explanation on how to use RPE scale Provide education and explanation on how to use RPE scale     Expected Outcomes Short Term: Able to use RPE daily in rehab to express subjective intensity level;Long Term:  Able to use RPE to guide intensity level when exercising independently Short Term: Able to use RPE daily in rehab to express subjective intensity level;Long Term:  Able to use RPE to guide intensity level when exercising independently Short Term: Able to use RPE daily in rehab to express subjective intensity level;Long Term:  Able to use RPE to guide intensity level when exercising  independently     Able to understand and use Dyspnea scale Yes Yes Yes     Intervention Provide education and explanation on how to use Dyspnea scale Provide education and explanation on how to use Dyspnea scale Provide education and explanation on how to use Dyspnea scale     Expected Outcomes Short Term: Able to use Dyspnea scale daily in rehab to express subjective sense of shortness of breath during exertion;Long Term: Able to use Dyspnea scale to guide intensity level when exercising independently Short Term: Able to use Dyspnea scale daily in rehab to express subjective sense of shortness of breath during exertion;Long Term: Able to use Dyspnea scale to guide intensity level when exercising independently Short Term: Able to use Dyspnea scale daily in rehab to express subjective sense of shortness of breath during exertion;Long Term: Able to use Dyspnea scale to guide intensity level when exercising independently     Knowledge and understanding of Target Heart Rate Range (THRR) Yes Yes Yes     Intervention Provide education and explanation of THRR including how the numbers were predicted and where they are located for reference Provide education and explanation of THRR  including how the numbers were predicted and where they are located for reference Provide education and explanation of THRR including how the numbers were predicted and where they are located for reference     Expected Outcomes Short Term: Able to state/look up THRR;Long Term: Able to use THRR to govern intensity when exercising independently;Short Term: Able to use daily as guideline for intensity in rehab Short Term: Able to state/look up THRR;Long Term: Able to use THRR to govern intensity when exercising independently;Short Term: Able to use daily as guideline for intensity in rehab Short Term: Able to state/look up THRR;Long Term: Able to use THRR to govern intensity when exercising independently;Short Term: Able to use daily as  guideline for intensity in rehab     Understanding of Exercise Prescription Yes Yes Yes     Intervention Provide education, explanation, and written materials on patient's individual exercise prescription Provide education, explanation, and written materials on patient's individual exercise prescription Provide education, explanation, and written materials on patient's individual exercise prescription     Expected Outcomes Short Term: Able to explain program exercise prescription;Long Term: Able to explain home exercise prescription to exercise independently Short Term: Able to explain program exercise prescription;Long Term: Able to explain home exercise prescription to exercise independently Short Term: Able to explain program exercise prescription;Long Term: Able to explain home exercise prescription to exercise independently            Exercise Goals Re-Evaluation:  Exercise Goals Re-Evaluation    Row Name 03/12/20 0811 04/09/20 0743           Exercise Goal Re-Evaluation   Exercise Goals Review Increase Physical Activity;Increase Strength and Stamina;Able to understand and use rate of perceived exertion (RPE) scale;Able to understand and use Dyspnea scale;Knowledge and understanding of Target Heart Rate Range (THRR);Understanding of Exercise Prescription Increase Physical Activity;Increase Strength and Stamina;Able to understand and use rate of perceived exertion (RPE) scale;Able to understand and use Dyspnea scale;Knowledge and understanding of Target Heart Rate Range (THRR);Understanding of Exercise Prescription      Comments Pt has attended 3 exercise sessions. I expect that she will progress well. She is currently exercising at 2.3 METs on the stepper. Will continue to monitor and progress as able. Pt has attended 9 exercise sessions. She is currently exercising at 2.5 METs on the stepper. Will continue to monitor and progress as able.      Expected Outcomes Through exercise at rehab and  at home, patient will increase endurance and strength. Patient will also be able to perform ADL's with less shortness of breath and fatigue. Through exercise at rehab and at home, patient will increase endurance and strength. Patient will also be able to perform ADL's with less shortness of breath and fatigue.             Nutrition & Weight - Outcomes:  Pre Biometrics - 02/20/20 1448      Pre Biometrics   Height 5' (1.524 m)    Weight 66.5 kg    BMI (Calculated) 28.63    Grip Strength 21.5 kg            Nutrition:  Nutrition Therapy & Goals - 03/05/20 1421      Nutrition Therapy   Diet Carb Modified, Mediterranean      Personal Nutrition Goals   Nutrition Goal Identify food quantities necessary to achieve wt loss of  -1# per week to a goal wt loss of 4-10 lb at graduation from pulmonary rehab.        Intervention Plan   Intervention Prescribe, educate and counsel regarding individualized specific dietary modifications aiming towards targeted core components such as weight, hypertension, lipid management, diabetes, heart failure and other comorbidities.;Nutrition handout(s) given to patient.    Expected Outcomes Long Term Goal: Adherence to prescribed nutrition plan.           Nutrition Discharge:  Nutrition Assessments - 05/07/20 1152      Rate Your Plate Scores   Post Score 67           Education Questionnaire Score:  Knowledge Questionnaire Score - 04/25/20 1436      Knowledge Questionnaire Score   Post Score 17/18           Goals reviewed with patient; copy given to patient.

## 2020-05-25 ENCOUNTER — Other Ambulatory Visit: Payer: Self-pay | Admitting: Internal Medicine

## 2020-05-28 ENCOUNTER — Other Ambulatory Visit: Payer: Self-pay | Admitting: Cardiology

## 2020-05-28 MED ORDER — METOPROLOL SUCCINATE ER 50 MG PO TB24: 50.0000 mg | ORAL_TABLET | Freq: Every day | ORAL | 1 refills | Status: DC

## 2020-05-28 NOTE — Telephone Encounter (Signed)
*  STAT* If patient is at the pharmacy, call can be transferred to refill team.   1. Which medications need to be refilled? (please list name of each medication and dose if known) metoprolol succinate (TOPROL-XL) 50 MG 24 hr tablet  2. Which pharmacy/location (including street and city if local pharmacy) is medication to be sent to? [id number] - [place], [place] - [address]  3. Do they need a 30 day or 90 day supply? 90   Patient wants Dr. Percival Spanish to refill this medication.

## 2020-05-28 NOTE — Telephone Encounter (Signed)
Pt calling to get prednisone refilled. Please advise. [phone]

## 2020-05-28 NOTE — Telephone Encounter (Signed)
Pt is calling for refill on prednisone has f/u 07/18/20 is it ok to fill

## 2020-05-29 ENCOUNTER — Telehealth: Payer: Self-pay | Admitting: Internal Medicine

## 2020-05-29 ENCOUNTER — Other Ambulatory Visit: Payer: Self-pay | Admitting: Internal Medicine

## 2020-05-29 NOTE — Telephone Encounter (Signed)
Spoke with pt, she states the Rx for prednisone was sent in already. Nothing further is needed.

## 2020-06-12 ENCOUNTER — Other Ambulatory Visit: Payer: Self-pay | Admitting: Internal Medicine

## 2020-06-12 MED ORDER — PRAVASTATIN SODIUM 40 MG PO TABS: 40.0000 mg | ORAL_TABLET | Freq: Every evening | ORAL | 1 refills | Status: AC

## 2020-06-13 DIAGNOSIS — R7301 Impaired fasting glucose: Secondary | ICD-10-CM | POA: Diagnosis not present

## 2020-06-13 DIAGNOSIS — E119 Type 2 diabetes mellitus without complications: Secondary | ICD-10-CM | POA: Diagnosis not present

## 2020-06-13 DIAGNOSIS — J8410 Pulmonary fibrosis, unspecified: Secondary | ICD-10-CM | POA: Diagnosis not present

## 2020-06-13 DIAGNOSIS — I370 Nonrheumatic pulmonary valve stenosis: Secondary | ICD-10-CM | POA: Diagnosis not present

## 2020-06-13 DIAGNOSIS — L249 Irritant contact dermatitis, unspecified cause: Secondary | ICD-10-CM | POA: Diagnosis not present

## 2020-06-19 ENCOUNTER — Encounter (HOSPITAL_COMMUNITY): Payer: Self-pay

## 2020-07-02 DIAGNOSIS — Z20822 Contact with and (suspected) exposure to covid-19: Secondary | ICD-10-CM | POA: Diagnosis not present

## 2020-07-15 ENCOUNTER — Other Ambulatory Visit (HOSPITAL_COMMUNITY)
Admission: RE | Admit: 2020-07-15 | Discharge: 2020-07-15 | Disposition: A | Discharge status: Home / Self Care | Payer: Medicare Other | Source: Ambulatory Visit | Attending: Internal Medicine | Admitting: Internal Medicine

## 2020-07-15 DIAGNOSIS — Z01812 Encounter for preprocedural laboratory examination: Secondary | ICD-10-CM | POA: Insufficient documentation

## 2020-07-15 DIAGNOSIS — Z20822 Contact with and (suspected) exposure to covid-19: Secondary | ICD-10-CM | POA: Diagnosis not present

## 2020-07-15 LAB — SARS CORONAVIRUS 2 (TAT 6-24 HRS): SARS Coronavirus 2: NEGATIVE

## 2020-07-16 ENCOUNTER — Telehealth: Payer: Self-pay | Admitting: Cardiology

## 2020-07-16 NOTE — Telephone Encounter (Signed)
Left a message for the patient to call back.  

## 2020-07-16 NOTE — Telephone Encounter (Signed)
Patient is scheduled to see Dr. Percival Spanish on 09/10/20, she thinks he wanted her to have a stress test done but she was unsure. I do not see any orders in. She also wants to know if lab work needs to be done prior, please advise.

## 2020-07-16 NOTE — Telephone Encounter (Signed)
Patient called to return [name]'s call. Advised the [name] will call her back.

## 2020-07-16 NOTE — Telephone Encounter (Signed)
Returned the call to the patient. She has an appointment in October with Dr. Percival Spanish.   She stated that she is feeling really good and would like to know if she could do a 12 month follow up (April 2022). Labs recently done at PCP.

## 2020-07-17 NOTE — Telephone Encounter (Signed)
Called patient and informed her that Dr. Percival Spanish is okay with seeing her in April 2022. I also let her know that if her develops any new or worsening symptoms to call our office. She was also informed that she will receive a letter in January/Fenruary 2022 reminding her to make her April appointment. Patient verbalized understanding and all (if any) questions were answered.

## 2020-07-17 NOTE — Telephone Encounter (Signed)
Sure

## 2020-07-18 ENCOUNTER — Other Ambulatory Visit: Payer: Self-pay

## 2020-07-18 ENCOUNTER — Ambulatory Visit (INDEPENDENT_AMBULATORY_CARE_PROVIDER_SITE_OTHER): Payer: Medicare Other | Admitting: Internal Medicine

## 2020-07-18 DIAGNOSIS — J849 Interstitial pulmonary disease, unspecified: Secondary | ICD-10-CM

## 2020-07-18 LAB — PULMONARY FUNCTION TEST
DL/VA % pred: 90 %
DL/VA: 3.91 ml/min/mmHg/L
DLCO cor % pred: 68 %
DLCO cor: 11.19 ml/min/mmHg
DLCO unc % pred: 68 %
DLCO unc: 11.19 ml/min/mmHg
FEF 25-75 Pre: 1.98 L/sec
FEF2575-%Pred-Pre: 126 %
FEV1-%Pred-Pre: 73 %
FEV1-Pre: 1.3 L
FEV1FVC-%Pred-Pre: 116 %
FEV6-%Pred-Pre: 65 %
FEV6-Pre: 1.47 L
FEV6FVC-%Pred-Pre: 105 %
FVC-%Pred-Pre: 62 %
FVC-Pre: 1.47 L
Pre FEV1/FVC ratio: 88 %
Pre FEV6/FVC Ratio: 100 %

## 2020-07-18 NOTE — Progress Notes (Signed)
PFT done today. 

## 2020-08-08 DIAGNOSIS — L814 Other melanin hyperpigmentation: Secondary | ICD-10-CM | POA: Diagnosis not present

## 2020-08-08 DIAGNOSIS — L304 Erythema intertrigo: Secondary | ICD-10-CM | POA: Diagnosis not present

## 2020-08-08 DIAGNOSIS — L821 Other seborrheic keratosis: Secondary | ICD-10-CM | POA: Diagnosis not present

## 2020-08-08 DIAGNOSIS — Z85828 Personal history of other malignant neoplasm of skin: Secondary | ICD-10-CM | POA: Diagnosis not present

## 2020-08-08 DIAGNOSIS — L820 Inflamed seborrheic keratosis: Secondary | ICD-10-CM | POA: Diagnosis not present

## 2020-08-08 DIAGNOSIS — D225 Melanocytic nevi of trunk: Secondary | ICD-10-CM | POA: Diagnosis not present

## 2020-08-08 DIAGNOSIS — L578 Other skin changes due to chronic exposure to nonionizing radiation: Secondary | ICD-10-CM | POA: Diagnosis not present

## 2020-08-19 NOTE — Telephone Encounter (Signed)
Patient requesting we take over prescription for Pro-Air. Please advise.

## 2020-08-21 MED ORDER — ALBUTEROL SULFATE HFA 108 (90 BASE) MCG/ACT IN AERS: 2.0000 | INHALATION_SPRAY | Freq: Four times a day (QID) | RESPIRATORY_TRACT | 5 refills | Status: DC | PRN

## 2020-08-21 NOTE — Telephone Encounter (Signed)
Ok for [place] to do pro-air

## 2020-08-26 ENCOUNTER — Ambulatory Visit: Payer: Medicare Other | Admitting: Internal Medicine

## 2020-08-29 ENCOUNTER — Encounter: Payer: Self-pay | Admitting: Internal Medicine

## 2020-08-29 ENCOUNTER — Other Ambulatory Visit: Payer: Self-pay

## 2020-08-29 ENCOUNTER — Ambulatory Visit (INDEPENDENT_AMBULATORY_CARE_PROVIDER_SITE_OTHER): Payer: Medicare Other | Admitting: Internal Medicine

## 2020-08-29 VITALS — BP 108/60 | HR 72 | Temp 98.0°F | Ht 60.0 in | Wt 141.2 lb

## 2020-08-29 DIAGNOSIS — Z7184 Encounter for health counseling related to travel: Secondary | ICD-10-CM | POA: Diagnosis not present

## 2020-08-29 DIAGNOSIS — J849 Interstitial pulmonary disease, unspecified: Secondary | ICD-10-CM | POA: Diagnosis not present

## 2020-08-29 DIAGNOSIS — Z889 Allergy status to unspecified drugs, medicaments and biological substances status: Secondary | ICD-10-CM

## 2020-08-29 DIAGNOSIS — R931 Abnormal findings on diagnostic imaging of heart and coronary circulation: Secondary | ICD-10-CM

## 2020-08-29 DIAGNOSIS — J209 Acute bronchitis, unspecified: Secondary | ICD-10-CM | POA: Diagnosis not present

## 2020-08-29 DIAGNOSIS — J679 Hypersensitivity pneumonitis due to unspecified organic dust: Secondary | ICD-10-CM | POA: Diagnosis not present

## 2020-08-29 MED ORDER — PREDNISONE 10 MG PO TABS: ORAL_TABLET | ORAL | 0 refills | Status: DC

## 2020-08-29 NOTE — Research (Signed)
Title: Chronic Fibrosing Interstitial Lung Disease with Progressive Phenotype Prospective Outcomes (ILD-PRO) Registry   Protocol #: IPF-[id number], Clinical Trials # [id number], Sponsor: [employer]  Protocol Version Amendment 4 dated 12Sep2019  and confirmed current on 08/29/2020 Consent Version for today's visit date of 08/29/2020  is Version Amendment 4 ([id number])  Clinical Research Coordinator / Research RN note : This visit for Subject Rhonda [name] with DOB: 02/22/1951 on 08/29/2020 for the above protocol is Visit/Encounter #30 Month Follow-up and is for purpose of research. The consent for this encounter is under Protocol Version Amendment 4 [phone]) and is currently IRB approved. Subject expressed continued interest and consent in continuing as a study subject. Subject confirmed that there was no change in contact information (e.g. address, telephone, email). Subject thanked for participation in research and contribution to science.   In this visit 08/29/2020 the subject completed the blood work and questionnaires per the above referenced protocol. Please refer to the subject's paper source binder for further details.  Signed by Gwendolyn Grant Research Assistant [facility]  [place], [place] 5:45 PM 08/29/2020

## 2020-08-29 NOTE — Patient Instructions (Addendum)
ICD-10-CM   1. ILD (interstitial lung disease) ([place])  J84.9   2. 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 oil pain and ? mold in house  J67.9   3. Acute bronchitis, unspecified organism  J20.9   4. Travel advice encounter  Z71.84     I think your interstitial lung disease over time and over the years is slowly progressive but does have a waxing and waning quality particularly in the fall in the winter.  Most recently I think it is a mountain travel that is causing some increased acute exacerbation versus acute bronchitis  Plan -Advised you to avoid going to the mountains [the hypoxemia here might potentially cause lung injury is my hypothesis]  - Please take Take prednisone 40mg  once daily x 4 days, then 30mg  once daily x 4 days, then 20mg  once daily x 4 days, then prednisone 10mg  once daily  To continue  -I think you will benefit from an antifibrotic given the slow worsening over time.  Too bad nintedanib did not work for you therefore, start esbriet using donor sample that I gave you  - 1 pill three times daily x 2 weeks - then 2 pills three times daily - to continue - we will keep you at this lower dose   -Take this with food  -Space dosing by at least 5-6 hours  -Apply sunscreen when you go out  -Continue oxygen with exertion [you qualify for this today]  -[facility] can do order to continue this with your DME provider  - refer Dr Hardie Pulley allergist  Follow-up -In 4 weeks do liver function test -Any problems call us or email [place] sooner -Return to see me in 6 weeks for 30-minute visit to discuss pirfenidone tolerance

## 2020-08-29 NOTE — Progress Notes (Signed)
#GE reflux with small hiatal hernia  - on ppi   #History of rapid heart rate not otherwise specified  - Start her on Lopressor  2008/2009 by primary care physician. History appears to correlate with onset of respiratory issues  - refuses to dc this drug as of 2014 discussion   # Hypersensitivity Pneumonitis and ILD  - Potential etiologies - cockateil x 2 for 18 years through end 2012. 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 Denies mold but lives in house built in [place] with a "weird baselment: and has humidifier  - first noted on CT chest 10/15/09 following trip to [place] (PE negative) but not described in 2003 CT chest report  - autoimmune panel: 10/23/11: Negative  -  Uderwennt bronch 11/19/11  - non diagnostic  - VATS Nov 2013 (done after initially refusing)- [place]. However, there is worrying trend of UIP pattern in the Upper lobes.            Oct/Nov 2012  March 2013 08/11/12  Nov/Dec 2013 Dx HP/UIP 01/02/2013  02/28/13 05/25/2013  12/06/2013  03/15/2014  06/29/2014  10'27/15 12/19/2014  02/26/15 01/13/2016  04/29/2016  10/23/2016  01/25/2017  06/17/2017 [facility] July 2018 01/11/2018  04/13/2018  08/16/2018   Symp/Signs Dyspnea x5y  New crackles  dimished dyspnea except at hill. Improved crackles.  Improved dyspnea except @ hill                 FVC  2.6 L    2.2 L/82%   2.1 L/80%   2.19 L/82%  2.2L/91% 2.1/81% 1.86/69% 1.9L/71% 1.86/75% 1.75L/65% 1.82/73% (dec 2015 offce spiro) 2.84L/72% 1.54/61% 1.78L/67%  1.69L/65%  1.61/62% 1.35/55% 1.65/67%  DLCO  10/50%    10/51%   9.6/51%     11.9/63% 11.7/62%  11.561%  5.5/31% 10.06/57% 11/04/63%  10.43/55% 8.56/50% 10.57/61% 10.16/57% 9.21/52%  Walk test 185 ft x 3 laps on RA  rest 92%. Pk exertion 90%. Pk HR 108 Rest 92%. Pk exertion 86% at 2.5 laps. Pk HR 102  pk exertion - 88% Rest 96%. Pk 95%. Pk HR is 75. Done at full dose  lopressor Rest 94%. Pk 91%. Rest HR 93 with pk HR 103/min. Done at half dose lopressor REst 96%. Pk pulse ox 92%. Rest HR 65. Pk HR 86 Did not desat. Normal Pk HR  Pulse ox 96%, droped to89% at 2nd lap but bounced up and ended at 91% 3rd lap Did not desat Did not desat, Pk HHR 93  ;llowest pulse ox 90% at 2nd lap but she held her breath, then 92% at 3rd lap 99% -> 91% at end of 3rd lap 94%, Dropped to 87% at 3rd lap, HR peak102/m 97%, dropped to 89% in 3rd lap - asymptomatic. HR 100/min No prob with 30md at [facility] 99% rest - -> dopped to 92% 3rd lap. Pkr HR 107/min 98% rest -> 91% 3rd lap with Pk HR 91/min   CT chest   unchanged          yes Ct mid dec possibly worse - unsure Ct - similar to April 2016 but worse since 2013   CT 06/24/17 - similar to June 2017     Tests/Bx Bronch 0- non diagnostic   VATS - HP with UIP in Upper lobe                   RX  Rx pred start 11/23/12 On  pred 54m per day and will reduce to 234mOn Pred 2071mer day On pred 64m65mr day On pred 5mg 17m day Rx pred 5mg d8my She reduced to pred 5mg x 46mt 1 month QOD due to GI concern side effects On pred 5mg qod26mt will increase to 5/d after this visit On pred 5mg dail68m On pred 5mg daily88mncrease pred to 64mg per d48meduce to pred 7.5 due to skin sissues  pred burs and reduce to 64mg per da87m                         OV 06/17/2017  Chief Complaint  Patient presents with  . Follow-up    [name]. Pt denies significant cough, denies CP/tightness, f/c/s.     She is better. Feels she does not need o2. Has been to [facility] for transplant clinic; felt too early. Seems higher dose prednisone helping but then she is also bettter t his time of the year. In May 2018 I was at [facility] and curbsided national thought leaders - feel that we could explore silent active GERD as a possibility of ILD getting wors with time.   OV 01/11/2018  Chief Complaint  Patient presents with  . Follow-up    PFT done today.  Pt  states she has been coughing x2 weeks since she returned from a trip to [place]. FroDelawareing, pt has had pain in ribs. Pt states she has mild SOB which is stable due to rehab.  Pt states that Dr. Richter is wDarron Doomto know if pt could possibly have eosinophilic esophagitis.   Went to [place]. RetDelawarend has been coughing x 2 weeks. Associated with this is some rib pain on right side. She feels she does not need o2. She feels she does not need pred/abx at this time. She also exposed to flu wit family   OV 02/08/2018  Chief Complaint  Patient presents with  . Acute Visit    CXR done 02/07/18.  Pt states her throat is sore but not as bad as it was yesterday when in office and does have some left ear pain. Pt states she has some pain on right rib cage and has increased fatigue.     Ms. Thornberry preNorthingtonutely.  I last saw her one month ago gave her Tamiflu for prophylaxis after flu exposure.  At that point in time she was already reducing her chronic stable prednisone of 10 mg for interstitial lung disease/hypersensitivity pneumonitis.  She had tapered herself slowly 3 or 4 weeks ago to 5 mg.  She says she was doing fairly okay but in the last 2 or 3 weeks she has had increased fatigue.  She says she goes daily swimming and then feels energized but then an hour later starts having fatigue which is more than usual.  Also in the daytime she has random fatigue.  In terms of her respiratory symptoms these are stable and in fact slightly better in terms of shortness of breath and cough but she is having sore throat without any fever and some left-sided otalgia.  In addition she is having right-sided chest pains that are more than usual.  The chest pains have been reported before.  These are specifically at localized spots along the previous incision several years ago for interstitial lung disease.  They are tender as well  to touch.  This pain is worse.  In addition she is also being bothered by arthralgia  in her hand and left hip.  The left hip arthralgias old with a hand arthralgias new.  She is worried about pneumothorax recurrence and so she had a chest x-ray yesterday that shows no pneumothorax.  She has stable interstitial lung disease changes but in my personal visualization these interstitial lung disease changes are worse compared to several years ago.  We do know that she has slowly worsening interstitial lung disease.  In terms of a walking desaturation test this shows stability since last visit.  She has seen GI for acid reflux and has pH probe study coming up  In talking to her she tells me she has been on metformin for over a month or 2.  This is new and is meant for pre-diabetes.  Apparently her hemoglobin A1c is always below 6 but is above 5.5.  Walking desaturation test on 02/08/2018 185 feet x 3 laps on ROOM AIR:  did walk normal pace with forehead probe desaturate. Rest pulse ox was 99%, final pulse ox was 93%. HR response 72/min at rest to 93/min at peak exertion. Patient ZAHRIYAH JOO  Did not Desaturate < 88% . Rhonda [name] yes did  Desaturated </= 3% points. Rhonda [name] yes did get tachyardic   Dg Chest 2 View  Result Date: 02/07/2018 CLINICAL DATA:  Chest wall pain for 2 weeks EXAM: CHEST - 2 VIEW COMPARISON:  CT 08/20/2017, radiograph 10/11/2015 FINDINGS: Coarse interstitial pattern consistent with pulmonary fibrosis, similar distribution compared to prior. No focal pulmonary opacity or pleural effusion. Stable cardiomediastinal silhouette with aortic atherosclerosis. No pneumothorax. Degenerative changes of the spine. IMPRESSION: No active cardiopulmonary disease. Similar appearance of diffuse pulmonary fibrosis. Negative for a pneumothorax. Electronically Signed   By: Donavan Foil M.D.   On: 02/07/2018 14:26    OV 04/13/2018  Chief Complaint  Patient presents with  . Follow-up    Pt had pre spiro and DLCO PFT prior to OV. Pt has increase of productive  cough-thick white in last week. Pt has some wheezing, and allergy issues.   Ms. Freda Munro presents for routine follow-up.  I saw her acutely just approximately 2 months ago.  Then end of April 2019 she saw a nurse practitioner again acutely and was given antibiotic and prednisone.  She says she felt better after that but in the last week or so due to increased pollen load in the community and also moving furniture because her daughter is relocating out of her home she started having more cough.  There is no change in her dyspnea with the cough is really bad.  This is despite Singulair and Chlor-Trimeton.  Those things to help but just take the edge off.  She feels that the pollen making the cough worse.  There is no change in dyspnea.  Walking desaturation test documented below his baseline.  She had pulmonary function test today but the [facility] shows significant decline compared to February 2019 and she feels surprised by this.  DLCO is baseline unchanged and a walking desaturation test limited in the office is also unchanged.  She does not have any fever but has significant postnasal drip.  She has not followed up at [facility] transplant clinic or the allergist recently.  OV 08/16/2018  Subjective:  Patient ID: Rhonda [name], female , DOB: [birth year] , age 47 y.o. , MRN: [id number] , ADDRESS: [address]  [place] [address]   08/16/2018 -   Chief Complaint  Patient presents with  . Follow-up    PFT performed today.  Pt states she has had a lot of mucus the month of September 2019 so she has been taking the Chlor-Trimeton. States other than that she has been able to do a lot more activities and has been swimming a lot more. Pt states she believes the SOB is stable.     HPI ADORA YEH 69 y.o. -continues to do well.  She is taking higher dose of prednisone 10 mg/day.  Also the summer usually she is doing well.  She is not having much of a cough.  Lung function test shows improvement  compared to tests done earlier this year.  In fact she is almost as good as much 2018.  Still compared to 2014-2016 she has progressive lung disease.  Walking desaturation test is stable.  She needs a high-dose flu shot but we do not have stock today.  She has lost some weight intentionally with better dietary control.  She is exercising regularly swimming.  We discussed ofev  potential future therapy if the drug is approved for this indication.  We will know study results in a year or so.  She is open to the idea but is worried somewhat about her irritable bowel syndrome flaring up.  She is no longer following at the [facility] lung transplant clinic given stability lung function  Other issues: She always has right-sided chest wall pain to the site of lung biopsy.  The only way this is been resolved as by her limiting her use of bra .  Also her irritable bowel syndrome is under control but in case it flares up she wants a refill of dicyclomine     OV 12/15/2018  Subjective:  Patient ID: Rhonda [name], female , DOB: [birth year] , age 46 y.o. , MRN: [id number] , AD[address]ESS: [address] [address]   12/15/2018 -   Chief Complaint  Patient presents with  . Follow-up    Pt states she has been doing better since last visit with TP but states she has been having pain in her rib cage x2 years but states it has become more intense.     HPI TREASURE O[facility]S 69 y.o. -returns for follow-up of her ILD due to hypersensitivity pneumonitis.  She did a clinic visit also for research with the ILD-pro registry study.  Since I last saw her in September 2019 she had a flareup and required higher doses of prednisone.  Since then she is returned to baseline of 5 mg prednisone and she feels good.  She is stable.  Walking desaturation test shows stability.  We have been discussing over the last few months about taking nintedanib based on new data and progressive non--IPF ILD's.  She has trepidation for  this because of irritable bowel syndrome.  After much discussion she is willing to try 100 mg once daily for a month and then escalate to 100 mg twice daily which is the therapeutic dose.  She will do this once after insurance approval which we suspect will happen over the next few months.  Her main issue is that her right sided infra-axillary chest pain is getting worse.  This started after her surgical lung biopsy and pneumothorax on the right side.  It is random and happens with twisting.  But now days it happens once every few days.  Previously it used to happen  once every few weeks.  Sometimes it is excruciating but then she is left in pain for a few days.  Applying Abrol sudden twisting of her body makes it worse.  Is a lancinating pain.  She did try gabapentin some years ago for chronic cough .  She did tolerate the gabapentin well.  She does not remember if it helped the pain or not but clearly back and the pain was much less intense.  She is willing to try this again.  I recommended a CT scan of the chest but she is going to have radiation for a CT coronary angiogram because of high levels of coronary artery calcification.  Therefore written to the radiologist to see if he can look at the lung parenchyma with a CT angiogram    OV 05/09/2019  Subjective:  Patient ID: Rhonda [name], female , DOB: 12/17/1950 , age 16 y.o. , MRN: [id number] , AD[address]ESS: [address] [place] [address]   05/09/2019 -   Chief Complaint  Patient presents with  . ILD (Interstitial Lung Disease)     HPI KERRIANNE JENG 69 y.o. -follow-up for chronic care physician pneumonitis on daily 5 mg prednisone.  She has problems with right-sided chest wall pain that is neuropathic.  She tells me since her last visit January 2020 she has been isolating and social distancing because of the pandemic.  Overall she is been stable.  Her symptom scores are mild and documented below.  She continues with prednisone.  She was  supposed to have pulmonary function test but this was held because of the pandemic.  Her walking desaturation test shows she is stable.  She is supposed to have pulmonary function test tomorrow but wanted to see me today.  In the interim she did have a cardiac CT scan of the chest that shows 94th percentile of coronary calcium.  She could not get a CT angiogram done because of PVC.  A nuclear medicine stress test was done and I reviewed this result and it was normal.  A Holter test is pending.  She is kind of nervous because of the ongoing COVID-19 situation and safety of doing a Holter test.  In terms of her right chest wall pain she is adapted to it.  She does not use any bra  OV 08/22/2019  Subjective:  Patient ID: Rhonda [name], female , DOB: 1951/09/09 , age 50 y.o. , MRN: [id number] , AD[address]ESS: [address] [address]   08/22/2019 -   Chief Complaint  Patient presents with  . Follow-up    needs sx clearance for L hip replacement- not yet scheduled, pending clearance.     Patient lung disease chronic hypersensitive pneumonitis on chronic daily prednisone 5 mg/day  HPI CHRISTELLA APP 69 y.o. -presents for follow-up of her interstitial lung disease due to chronic hypersensitive pneumonitis.  After last visit in June 2020 given her PFT stability and also her aversion to the potential side effects with nintedanib she decided to just continue with prednisone daily 5 mg/day.  Given the pandemic she is walking 1 mile a day without stopping.  Her interstitial lung disease symptom score is actually slightly better.  Overall she reports stability in her interstitial lung disease.  She has a new issue of getting preoperative clearance for her left hip.  She says the left hip is bone-on-bone and she does not want to take Aleve.  She prefers to have surgery.  Dr. Paralee Cancel is  the surgeon.  Surgery date has not been set.  She is very functional.  She has normal renal function.  Normal  nutritional status.  No anemia.  No respiratory infections in the last 1 month.  She is not on oxygen.  Age 58.  The surgeries in the left hip and the duration of surgery is under 3 hours.  Anesthesia will be general.      OV 01/30/2020  Subjective:  Patient ID: Rhonda [name], female , DOB: 06/16/1951 , age 28 y.o. , MRN: [id number] , AD[address]ESS: [address] [address] Patient lung disease chronic hypersensitive pneumonitis on chronic daily prednisone 5 mg/day  01/30/2020 -   Chief Complaint  Patient presents with  . Follow-up    PFT performed 3/8.  Pt states she believes she has been going downhill for the past 2 months. Pt was started on O2 at last OV and wears it prn. Pt does have an occ cough.     HPI KOBY HARTFIELD 68 y.o. -presents for follow-up of interstitial lung disease secondary to chronic hypersensitive pneumonitis.  She maintains herself on prednisone 5 mg/day.  In the interim she has had hip surgery and this did wonders for her.  She is able to walk longer distances.  However she does notice that her dyspnea has declined.  She says since January 2021 his symptoms have declined particularly with cough.  Usually in the winter she goes through a cycle of severe cough and exacerbation that requires higher levels of prednisone.  She has been social distancing well and masking well.  She states there is absolutely no mold or water any antigen exposure at home.  But she feels it is her allergies acting up and making her more symptomatic.  She has had a Covid vaccine and is wondering about either going back to swimming or starting pulmonary rehabilitation.  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  In fact when we walked her today with the mask she did drop down to 89%.  This is a decline for her.  She had pulmonary function test and this shows significant decline in FVC and DLCO.  In  review of this that.  Where she has declined this much but is always bounce back with steroids.  At last visit we discussed nintedanib for her as a way to prevent progression of her ILD but given her concerns of GI side effects and the presence of irritable bowel syndrome she is declined.  Infective have this conversation with her including her joining the [employer] at [facility] [employer] but she has always been worried about the GI side effects with nintedanib.  This time she is more open to it because of the decline in lung function.  She wants to see an allergist  She also told me that she continues to have a restrictive chest pain particularly on the right side lower area.  She no longer wears a brassiere it has been nearly 3 years since she had a high-resolution CT chest.  She is open to having one now.  Last liver function test was normal in January 2020.  Last renal function was normal in November 2020.      OV 03/19/2020  Subjective:  Patient ID: Rhonda [name], female , DOB: 12/22/1950 , age 53 y.o. , MRN: [id number] , ADDRESS: [address] [place] [address]  Chronic  hypersensitive pneumonitis interstitial lung disease on prednisone daily 10 mg 03/19/2020 -   Chief Complaint  Patient presents with  . Follow-up    Pt states she is doing better since last visit and states her breathing has also improved.     HPI YANETTE TRIPOLI 69 y.o. -at last visit in March 2021 there was decline in lung function.  Therefore we started nintedanib.  She took 1 tablet of 150 mg and then had immediate abdominal pain and fever.  Therefore she is decided against taking this.  I also support this decision because I do not think she will tolerate nintedanib.  We did extensive interstitial lung disease question a history again at that time.  She had taken 1 dose of nitrofurantoin.  Have indicated to her that she should never take this medicine again.  In addition discovered that she might have some feather jackets  at home she has now gotten rid of it.  She is now doing pulmonary rehabilitation.  Her overnight oxygen desaturation test is negative.  At pulmonary rehabilitation she tells me she does not drop below 91% and 1 time she went to 89%.  Otherwise overall doing great.  Her cardiologist is increased her Lopressor.  Her daughter is getting married and she is busy with the wedding planning this is being emotionally stressful for her.  Her allergist has increased her recommendations and this is also helping her.  Her allergist is retiring and she wanted recommendations for new allergist.  I have recommended Dr. Fredderick Phenix and Dr. Remus Blake.  Incidentally that right-sided chest pain that she had is also better with increased prednisone of 10 mg/day.  She feels prednisone is working really well for her.    OV 08/29/2020   Subjective:  Patient ID: Rhonda [name], female , DOB: Mar 03, 1951, age 81 y.o. years. , MRN: [id number],  ADDRESS: [address] [address] PCP  Hayden Rasmussen, MD Providers : Treatment Team:  Attending Provider: Brand Males, MD Patient Care Team: Hayden Rasmussen, MD as PCP - General (Family Medicine) Minus Breeding, MD as PCP - Cardiology (Cardiology) Brand Males, MD as Consulting Physician (Pulmonary Disease) Verl Blalock, Marijo Conception, MD (Inactive) (Cardiology) Kennith Center, RD as Dietitian (Family Medicine) Minus Breeding, MD as Consulting Physician (Cardiology)  Follow-up chronic hypersensitive pneumonitis on chronic prednisone 10 mg/day.  Last high-resolution CT chest March 2021.  Intolerant to nintedanib.  Chief Complaint  Patient presents with  . Follow-up    Pt states she had been doing well since last visit up until 3-4 weeks ago and started having problems with her breathing and has had to use her O2 with activities. Pt also has been coughing a lot and is getting up clear phlegm. Pt also has had some mild wheeze.       HPI TIAH HECKEL 69 y.o.  -returns for follow-up.  Since I last saw her her daughter is now married.  She had a great wedding.  She was able to enjoy this and dance quite well.  She barely used her oxygen her effort tolerance was good.  Some 3 [place]  Apparently she had difficulty getting her oxygen tank to clinic to the electrical port.  She noticed with exertion a pulse ox dropping to the 70s.  She said even before she left a few days prior to that she started having increasing cough.  She notices that these cough  cycles get exacerbated in the fall in the winter.  Looking back at her pulmonary function test there is always a dip between October and March each year.  Between 2015 and 2017 there was a decline and since then there is a waxing and waning quality with dips in the winter in the fall season.  Each time she responds to prednisone.  Most recent pulmonary function test in August 2021 is actually improved compared to March 2021.  However today she is feeling worse.  She feels something is in her airway.  She feels dry air in the house and the animals bringing leaves from outside is contributing.  She says she feels better upstairs than downstairs where the dogs.  She again denies any mold or mildew.  Denies any feather jacket exposure.  She feels she will benefit from another round of prednisone.  She feels in the past antibiotics have not helped her as much as prednisone.   In terms of chronic ILD HP: She not tolerate nintedanib in the past.  I explained to her that I am concerned about progression and her decline in functional quality and the risk for that.  We discussed pirfenidone as an alternative.  Discussed the fact is less well studied although have no reason to see why it would not be effective.  Explained the side effect profile.  She is willing to try this.  We decided to go donor samples from the patient support group  Oxygen qualification: We walked her and  she qualified for oxygen today.  Is for portable oxygen with [employer]  Other issue: Spring allergies: -Her allergist is retired.  We discussed options of referral.  Referred her to Dr. Hardie Pulley referred to    SYMPTOM SCALE - ILD Sept 2020 01/30/2020  08/29/2020   O2 use  o2 with ex o2 withe ex 2L   Shortness of Breath  0 -> 5 scale with 5 being worst (score 6 If unable to do)   At rest 0 0 1  Simple tasks - showers, clothes change, eating, shaving 01 1 1  Household (dishes, doing bed, laundry) 0 3 3  Shopping '1 1 1  ' Walking level at own pace '1 2 2  ' Walking up Stairs '2 3 3  ' Total (30-36) Dyspnea Score '5 10 11  ' How bad is your cough? '1 3 2  ' How bad is your fatigue '1 2 2  ' How bad is nausea  0 0  How bad is vomiting?   0 0  How bad is diarrhea?  00 1  How bad is anxiety?  0 0  How bad is depression  0 2    Results for Dain, Belissa A (MRN [id number]) as of 08/22/2019 11:56  Ref. Range 12/06/2013 13:03 03/15/2014 15:56 09/18/2014 11:41 02/26/2015 09:02 12/16/2015 16:31 04/29/2016 12:44 01/25/2017 10:16 01/11/2018 15:19 04/13/2018 08:47 08/16/2018 09:00 05/10/2019 09:42 3/8?21 07/18/20  FVC-Pre Latest Units: L 1.86 1.91 1.73 1.84 1.42  1.69 1.61 1.35 1.65 1.62 1.39 1.47  FVC-%Pred-Pre Latest Units: % '69 71 64 72 56 67 65 62 55 67 ' 67 57% 62%   Results for Kowal, Faatimah A (MRN [id number]) as of 08/22/2019 11:56  Ref. Range 12/06/2013 13:03 03/15/2014 15:56 09/18/2014 11:41 02/26/2015 09:02 12/16/2015 16:31 04/29/2016 12:44 01/25/2017 10:16 01/11/2018 15:19 04/13/2018 08:47 08/16/2018 09:00 05/10/2019 09:42 01/29/20 07/18/20  DLCO unc Latest Units: ml/min/mmHg 11.99 11.70 11.53 5.52 10.06 12.12 10.43 10.57 10.16 9.21 10.32 9.36 11.19  DLCO unc % pred Latest Units: %  '63 62 61 31 57 64 55 56 57 52 ' 63 57% 68%    Simple office walk 185 feet x  3 laps goal with forehead probe 04/13/2018  08/16/2018  12/15/2018  05/09/2019  08/22/2019  01/30/2020  08/29/2020   O2 used Room air Room air Room air Room air Room air Room air ra   Number laps completed '3 3 3 3 2 ' stopped at 2 die to hip pain 3 laps - no hip pain following hip surgery 3  Comments about pace good Moderate pace Normal, hip bothering      Resting Pulse Ox/HR 98% and 73/min 98% and HR 77/min 99% and HR 61/min 98% and HR 70/min 98% 98% and 75/min 97% and 67/mi  Final Pulse Ox/HR 91% and 91/min 93% and 92.min 94% and 92/min 93% and 98/min 91%  89% and 93/,imn 88% and 94  Desaturated </= 88% no no no no     Desaturated <= 3% points yes yes Yes, 5 points Yes, 5 points     Got Tachycardic >/= 90/min yes yes yes yes     Symptoms at end of test none none Hip pain and very mild dyspnea  Stopped due to hip pain Moderate duyspnea with mask Mild to moderate dyspnea  Miscellaneous comments x x    No hip pain     ROS - per HPI    -     PFT Results Latest Ref Rng & Units 07/18/2020 01/29/2020 05/10/2019 08/16/2018 04/13/2018 01/11/2018 01/25/2017  FVC-Pre L 1.47 1.39 1.62 1.65 1.35 1.61 1.69  FVC-Predicted Pre % 62 57 67 67 55 62 65  FVC-Post L - - - - - - -  FVC-Predicted Post % - - - - - - -  Pre FEV1/FVC % % 88 91 89 90 93 91 90  Post FEV1/FCV % % - - - - - - -  FEV1-Pre L 1.30 1.26 1.45 1.49 1.26 1.46 1.53  FEV1-Predicted Pre % 73 69 80 81 68 75 77  FEV1-Post L - - - - - - -  DLCO uncorrected ml/min/mmHg 11.19 9.36 10.32 9.21 10.16 10.57 10.43  DLCO UNC% % 68 57 63 52 57 56 55  DLCO corrected ml/min/mmHg 11.19 9.36 - - - - 10.05  DLCO COR %Predicted % 68 57 - - - - 53  DLVA Predicted % 90 81 93 88 104 95 92  TLC L - - - - - - -  TLC % Predicted % - - - - - - -  RV % Predicted % - - - - - - -    ROS - per HPI     has a past medical history of Allergy, Arthritis, Asthma, Cataract, Coronary artery calcification seen on CAT scan (08/19/2017), DDD (degenerative disc disease), thoracic, Depression, DOE (dyspnea on exertion), Fatty liver, GERD (gastroesophageal reflux disease), H/O steroid therapy, Heart palpitations (12/25/5748), Helicobacter pylori ab+,  Hemorrhoids, Hiatal hernia, High cholesterol, History of chronic bronchitis, History of migraine, History of MRSA infection, Hyperlipidemia, mixed (08/19/2017), Hyperplastic colon polyp (2007), IBS (irritable bowel syndrome), Inguinal hernia, Insulin resistance, Interstitial lung disease ([place]), MVP (mitral valve prolapse), Pneumonia, Pneumonitis, hypersensitivity (HCC), PONV (postoperative nausea and vomiting), Pre-diabetes, Pulmonary fibrosis ([place]), PVC (premature ventricular contraction) (08/19/2017), Rapid heart rate, Squamous cell carcinoma of skin, and Tinnitus, right ear.   reports that she has never smoked. She has never used smokeless tobacco.  Past Surgical History:  Procedure Laterality Date  . [place] STUDY N/A 02/21/2018  Procedure: [place];  Surgeon: Mauri Pole, MD;  Location: [facility];  Service: Endoscopy;  Laterality: N/A;  . BREAST BIOPSY Right 2009   BIOPSY, pt denies  . BREAST BIOPSY Left 2003   Benign   . CATARACT EXTRACTION Left   . CESAREAN SECTION    . COLONOSCOPY    . ESOPHAGEAL MANOMETRY N/A 02/21/2018   Procedure: ESOPHAGEAL MANOMETRY (EM);  Surgeon: Mauri Pole, MD;  Location: [facility];  Service: Endoscopy;  Laterality: N/A;  . FOOT FRACTURE SURGERY  2006 or 2007   right  . HYMENECTOMY    . LUNG BIOPSY  09/28/2012   Procedure: LUNG BIOPSY;  Surgeon: Melrose Nakayama, MD;  Location: [facility];  Service: Thoracic;  Laterality: N/A;  lung biopsies tims three  . SQUAMOUS CELL CARCINOMA EXCISION Left    left arm  . TOTAL HIP ARTHROPLASTY Right 09/10/2015   Procedure: RIGHT TOTAL HIP ARTHROPLASTY ANTERIOR APPROACH;  Surgeon: Paralee Cancel, MD;  Location: [facility];  Service: Orthopedics;  Laterality: Right;  . TOTAL HIP ARTHROPLASTY Left 10/03/2019   Procedure: TOTAL HIP ARTHROPLASTY ANTERIOR APPROACH;  Surgeon: Paralee Cancel, MD;  Location: [facility];  Service: Orthopedics;  Laterality: Left;  70 mins  . TUBAL LIGATION    . UPPER GI ENDOSCOPY     . VIDEO ASSISTED THORACOSCOPY  09/28/2012   Procedure: VIDEO ASSISTED THORACOSCOPY;  Surgeon: Melrose Nakayama, MD;  Location: [place] [facility];  Service: Thoracic;  Laterality: Right;  [place] VIDEO BRONCHOSCOPY  11/19/2011   Procedure: VIDEO BRONCHOSCOPY WITH FLUORO;  Surgeon: Brand Males, MD;  Location: [facility] END[facility];  Service: Endoscopy;;    Allergies  Allergen Reactions  . Atorvastatin Other (See Comments)    Leg pain  . Betadine [Povidone Iodine] Other (See Comments)    blisters  . Codeine Nausea And Vomiting  . Garlic Diarrhea  . Hydrocodone Nausea And Vomiting  . Macrobid [Nitrofurantoin] Other (See Comments)  . Ofev [Nintedanib] Other (See Comments)    Abdominal pain  . Onion Diarrhea  . Rosuvastatin Other (See Comments)    Leg pain  . Shellfish Allergy Nausea And Vomiting  . Sulfa Antibiotics   . Sulfonamide Derivatives Other (See Comments)    headaches    Immunization History  Administered Date(s) Administered  . Fluad Quad(high Dose 65+) 07/17/2019  . Hepatitis A 05/23/2010  . Hepatitis B 06/23/2006  . Influenza Split 08/11/2012, 08/14/2017  . Influenza Whole 09/08/2011  . Influenza, High Dose Seasonal PF 08/24/2016, 07/24/2017, 08/31/2018, 08/15/2020  . Influenza,inj,Quad PF,6+ Mos 09/07/2013  . Influenza-Unspecified 09/26/2014, 07/27/2015  . MMR 05/23/2010  . PFIZER SARS-COV-2 Vaccination 12/16/2019, 01/08/2020, 07/18/2020  . Pneumococcal Conjugate-13 03/15/2014  . Pneumococcal Polysaccharide-23 01/11/2018  . Td 02/22/2003  . Tdap 10/28/2012  . Zoster 12/04/2010  . Zoster Recombinat (Shingrix) 02/15/2017, 05/17/2017    Family History  Problem Relation Age of Onset  . Emphysema Maternal Grandmother   . Asthma Maternal Grandmother   . Asthma Mother   . Osteoarthritis Mother   . Dementia Mother 13  . Lymphoma Father   . Diabetes Father   . Hypertension Sister   . Heart disease Sister   . Kidney disease Sister   . Lung disease Maternal Grandfather   .  Bone cancer Paternal Grandfather   . Allergic rhinitis Daughter   . Breast cancer Paternal Aunt   . Ovarian cancer Maternal Aunt   . Breast cancer Cousin   . Colon cancer Neg Hx   . Angioedema Neg  Hx   . Eczema Neg Hx   . Immunodeficiency Neg Hx   . Urticaria Neg Hx      Current Outpatient Medications:  .  acetaminophen (TYLENOL) 500 MG tablet, Tylenol Extra Strength, Disp: , Rfl:  .  albuterol (PROAIR HFA) 108 (90 Base) MCG/ACT inhaler, Inhale 2 puffs into the lungs every 4 (four) hours as needed for wheezing. Or coughing spells.  You may use 2 Puffs 5-10 minutes before exercise., Disp: 1 Inhaler, Rfl: 3 .  Alpha-D-Galactosidase (BEANO PO), Take 1 tablet by mouth as needed (if eating onion or garlic). , Disp: , Rfl:  .  Ascorbic Acid (VITAMIN C) 1000 MG tablet, Take 1,000 mg by mouth daily., Disp: , Rfl:  .  CALCIUM CITRATE PO, Take 2 tablets by mouth daily., Disp: , Rfl:  .  chlorpheniramine (CHLOR-TRIMETON) 4 MG tablet, Take 2 mg by mouth 2 (two) times daily. Takes half tablet twice daily, Disp: , Rfl:  .  dicyclomine (BENTYL) 10 MG capsule, Take 10 mg by mouth as needed for spasms., Disp: , Rfl:  .  EPINEPHrine 0.3 mg/0.3 mL IJ SOAJ injection, Inject into the muscle., Disp: , Rfl:  .  ezetimibe (ZETIA) 10 MG tablet, TAKE 1 TABLET (10 MG TOTAL) BY MOUTH DAILY. PLEASE KEEP UPCOMING APPT FOR FUTURE REFILLS. THANK YOU., Disp: 90 tablet, Rfl: 3 .  fluticasone (FLONASE) 50 MCG/ACT nasal spray, SPRAY 2 SPRAYS INTO EACH NOSTRIL EVERY DAY, Disp: 48 mL, Rfl: 1 .  fluticasone furoate-vilanterol (BREO ELLIPTA) 100-25 MCG/INH AEPB, Inhale 1 puff into the lungs daily., Disp: 180 each, Rfl: 3 .  lactase (LACTAID) 3000 units tablet, Take by mouth., Disp: , Rfl:  .  metFORMIN (GLUCOPHAGE-XR) 500 MG 24 hr tablet, Take 500 mg by mouth daily with supper., Disp: , Rfl:  .  metoprolol succinate (TOPROL-XL) 50 MG 24 hr tablet, Take 1 tablet (50 mg total) by mouth daily., Disp: 90 tablet, Rfl: 1 .   montelukast (SINGULAIR) 10 MG tablet, TAKE 1 TABLET BY MOUTH EVERYDAY AT BEDTIME, Disp: 90 tablet, Rfl: 2 .  Multiple Vitamin (MULTIVITAMIN) tablet, Take 1 tablet by mouth daily., Disp: , Rfl:  .  nystatin-triamcinolone (MYCOLOG II) cream, nystatin-triamcinolone 100,000 unit/g-0.1 % topical cream, Disp: , Rfl:  .  omeprazole (PRILOSEC) 20 MG capsule, Take 1 capsule (20 mg total) by mouth daily., Disp: 30 capsule, Rfl: 5 .  pravastatin (PRAVACHOL) 40 MG tablet, Take 1 tablet (40 mg total) by mouth every evening., Disp: 90 tablet, Rfl: 1 .  predniSONE (DELTASONE) 10 MG tablet, Take 10 mg by mouth daily with breakfast., Disp: , Rfl:  .  Probiotic Product (PROBIOTIC ADVANCED PO), Take 2 capsules by mouth daily. , Disp: , Rfl:  .  Vitamin D, Ergocalciferol, (DRISDOL) 1.25 MG (50000 UNIT) CAPS capsule, Take 50,000 Units by mouth every 7 (seven) days., Disp: , Rfl:  .  aspirin EC 81 MG tablet, Take 81 mg by mouth daily. (Patient not taking: Reported on 08/29/2020), Disp: , Rfl:  .  nitroGLYCERIN (NITROSTAT) 0.4 MG SL tablet, Place 1 tablet (0.4 mg total) under the tongue every 5 (five) minutes as needed for chest pain., Disp: 25 tablet, Rfl: 12 .  predniSONE (DELTASONE) 10 MG tablet, Take 4tabsx4days, 3tabsx4days, 2tabsx4days, then continue on 1tab daily, Disp: 36 tablet, Rfl: 0      Objective:   Vitals:   08/29/20 1613  BP: 108/60  Pulse: 72  Temp: 98 F (36.7 C)  TempSrc: Other (Comment)  SpO2: 96%  Weight:  141 lb 3.2 oz (64 kg)  Height: 5' (1.524 m)    Estimated body mass index is 27.58 kg/m as calculated from the following:   Height as of this encounter: 5' (1.524 m).   Weight as of this encounter: 141 lb 3.2 oz (64 kg).  '@WEIGHTCHANGE' @  [employer]   08/29/20 1613  Weight: 141 lb 3.2 oz (64 kg)     Physical Exam  General: No distress. Looks well Neuro: Alert and Oriented x 3. GCS 15. Speech normal Psych: Pleasant Resp:  Barrel Chest - no.  Wheeze - no, Crackles - yes esp  UL, No overt respiratory distress but somewhat labored talking through mask CVS: Normal heart sounds. Murmurs - no Ext: Stigmata of Connective Tissue Disease - no HEENT: Normal upper airway. PEERL +. No post nasal drip        Assessment:       ICD-10-CM   1. ILD (interstitial lung disease) (HCC)  J84.9 Hepatic function panel  2. 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 oil pain and ? mold in house  J67.9   3. Acute bronchitis, unspecified organism  J20.9 Ambulatory referral to Allergy  4. Travel advice encounter  Z71.84   5. H/O seasonal allergies  Z88.9    Concerned about acute bronchitis/flareup given the trip to the mountains.  We will treat this with prednisone  Also concerned about slowly progressive ILD.  Strongly recommended antifibrotic's.  Given the fact she did not tolerate nintedanib.  She is somewhat loath understandably to go through insurance approval process for the alternative pirfenidone.  We discussed all the side effect profile of pirfenidone and she is agreeable to try this.  She preferred to try donor samples  - Given donor samples of pirfenidone to 267 mg tablet.  Originally filled at [facility].  Manufacturer's [employer].  Expiration is September 2024.  Lot is [id number]  We will requalify her for oxygen portable  We will set her up with allergy evaluation with Dr. Kandis Cocking to avoid travel to the [place]:     Patient Instructions     ICD-10-CM   1. ILD (interstitial lung disease) ([place])  J84.9   2. 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 oil pain and ? mold in house  J67.9   3. Acute bronchitis, unspecified organism  J20.9   4. Travel advice encounter  Z71.84     I think your interstitial lung disease over time and over the years is slowly progressive but does have a waxing and waning quality particularly in the fall in the winter.  Most recently I think it is a mountain travel that is causing some increased  acute exacerbation versus acute bronchitis  Plan -Advised you to avoid going to the mountains [the hypoxemia here might potentially cause lung injury is my hypothesis]  - Please take Take prednisone 22m once daily x 4 days, then 377monce daily x 4 days, then 2041mnce daily x 4 days, then prednisone 58m67mce daily  To continue  -I think you will benefit from an antifibrotic given the slow worsening over time.  Too bad nintedanib did not work for you therefore, start esbriet using donor sample that I gave you  - 1 pill three times daily x 2 weeks - then 2 pills three times daily - to continue - we will keep you at this lower dose   -Take this with food  -Space dosing by at least 5-6 hours  -  Apply sunscreen when you go out  -Continue oxygen with exertion [you qualify for this today]  -[facility] can do order to continue this with your DME provider  - refer Dr Hardie Pulley allergist - did email Dr Remus Blake  Follow-up -In 4 weeks do liver function test -Any problems call us or email [place] sooner -Return to see me in 6 weeks for 30-minute visit to discuss pirfenidone tolerance   ( Level 05 visit: Estb 40-54 min  in  visit type: on-site physical face to visit  in total care time and counseling or/and coordination of care by this undersigned MD - Dr Brand Males. This includes one or more of the following on this same day 08/29/2020: pre-charting, chart review, note writing, documentation discussion of test results, diagnostic or treatment recommendations, prognosis, risks and benefits of management options, instructions, education, compliance or risk-factor reduction. It excludes time spent by the [place] or office staff in the care of the patient. Actual time 40 min)   SIGNATURE    Dr. Brand Males, M.D., F.C.C.P,  Pulmonary and Critical Care Medicine Staff Physician, [place] Director - Interstitial Lung Disease  Program  Pulmonary [place] at  [place], [place], [address]  Pager: [phone], If no answer or between  15:00h - 7:00h: call [phone] Telephone: [phone]  5:44 PM 08/29/2020

## 2020-09-10 ENCOUNTER — Ambulatory Visit: Payer: Medicare Other | Admitting: Cardiology

## 2020-09-26 DIAGNOSIS — J3089 Other allergic rhinitis: Secondary | ICD-10-CM | POA: Diagnosis not present

## 2020-09-26 DIAGNOSIS — J301 Allergic rhinitis due to pollen: Secondary | ICD-10-CM | POA: Diagnosis not present

## 2020-09-26 DIAGNOSIS — J4540 Moderate persistent asthma, uncomplicated: Secondary | ICD-10-CM | POA: Diagnosis not present

## 2020-09-26 DIAGNOSIS — J3081 Allergic rhinitis due to animal (cat) (dog) hair and dander: Secondary | ICD-10-CM | POA: Diagnosis not present

## 2020-10-02 ENCOUNTER — Other Ambulatory Visit (INDEPENDENT_AMBULATORY_CARE_PROVIDER_SITE_OTHER): Payer: Medicare Other

## 2020-10-02 DIAGNOSIS — J849 Interstitial pulmonary disease, unspecified: Secondary | ICD-10-CM

## 2020-10-02 LAB — CBC
HCT: 43 % (ref 36.0–46.0)
Hemoglobin: 14.3 g/dL (ref 12.0–15.0)
MCHC: 33.1 g/dL (ref 30.0–36.0)
MCV: 88.4 fl (ref 78.0–100.0)
Platelets: 247 10*3/uL (ref 150.0–400.0)
RBC: 4.87 Mil/uL (ref 3.87–5.11)
RDW: 14.2 % (ref 11.5–15.5)
WBC: 11.1 10*3/uL — ABNORMAL HIGH (ref 4.0–10.5)

## 2020-10-02 LAB — HEPATIC FUNCTION PANEL
ALT: 15 U/L (ref 0–35)
AST: 22 U/L (ref 0–37)
Albumin: 3.8 g/dL (ref 3.5–5.2)
Alkaline Phosphatase: 68 U/L (ref 39–117)
Bilirubin, Direct: 0.1 mg/dL (ref 0.0–0.3)
Total Bilirubin: 0.3 mg/dL (ref 0.2–1.2)
Total Protein: 6.9 g/dL (ref 6.0–8.3)

## 2020-10-07 NOTE — Progress Notes (Signed)
LFT normal. CBC - wbc high normal but c/w priors. Will not be calling in these results

## 2020-10-08 ENCOUNTER — Encounter: Payer: Self-pay | Admitting: Internal Medicine

## 2020-10-08 ENCOUNTER — Other Ambulatory Visit: Payer: Self-pay

## 2020-10-08 ENCOUNTER — Ambulatory Visit (INDEPENDENT_AMBULATORY_CARE_PROVIDER_SITE_OTHER): Payer: Medicare Other | Admitting: Internal Medicine

## 2020-10-08 ENCOUNTER — Ambulatory Visit (INDEPENDENT_AMBULATORY_CARE_PROVIDER_SITE_OTHER): Payer: Medicare Other

## 2020-10-08 ENCOUNTER — Ambulatory Visit: Payer: Medicare Other

## 2020-10-08 VITALS — BP 130/60 | HR 74 | Temp 97.4°F | Ht 60.0 in | Wt 145.6 lb

## 2020-10-08 DIAGNOSIS — R079 Chest pain, unspecified: Secondary | ICD-10-CM | POA: Diagnosis not present

## 2020-10-08 DIAGNOSIS — R931 Abnormal findings on diagnostic imaging of heart and coronary circulation: Secondary | ICD-10-CM

## 2020-10-08 DIAGNOSIS — J679 Hypersensitivity pneumonitis due to unspecified organic dust: Secondary | ICD-10-CM

## 2020-10-08 DIAGNOSIS — J849 Interstitial pulmonary disease, unspecified: Secondary | ICD-10-CM

## 2020-10-08 DIAGNOSIS — R0789 Other chest pain: Secondary | ICD-10-CM

## 2020-10-08 NOTE — Progress Notes (Addendum)
Oct/Nov 2012  March 2013 08/11/12  Nov/Dec 2013 Dx HP/UIP 01/02/2013  02/28/13 05/25/2013  12/06/2013  03/15/2014  06/29/2014  10'27/15 12/19/2014  02/26/15 01/13/2016  04/29/2016  10/23/2016  01/25/2017  06/17/2017 [facility] July 2018 01/11/2018  04/13/2018  08/16/2018   Symp/Signs Dyspnea x5y  New crackles  dimished dyspnea except at hill. Improved crackles.  Improved dyspnea except @ hill                 FVC  2.6 L    2.2 L/82%   2.1 L/80%   2.19 L/82%  2.2L/91% 2.1/81% 1.86/69% 1.9L/71% 1.86/75% 1.75L/65% 1.82/73% (dec 2015 offce spiro) 2.84L/72% 1.54/61% 1.78L/67%  1.69L/65%  1.61/62% 1.35/55% 1.65/67%  DLCO  10/50%    10/51%   9.6/51%     11.9/63% 11.7/62%  11.561%  5.5/31% 10.06/57% 11/04/63%  10.43/55% 8.56/50% 10.57/61% 10.16/57% 9.21/52%  Walk test 185 ft x 3 laps on RA  rest 92%. Pk exertion 90%. Pk HR 108 Rest 92%. Pk exertion 86% at 2.5 laps. Pk HR 102  pk exertion - 88% Rest 96%. Pk 95%. Pk HR is 75. Done at full dose lopressor Rest 94%. Pk 91%. Rest HR 93 with pk HR 103/min. Done at half dose lopressor REst 96%. Pk pulse ox 92%. Rest HR 65. Pk HR 86 Did not desat. Normal Pk HR  Pulse ox 96%, droped to89% at 2nd lap but bounced up and ended at 91% 3rd lap Did not desat Did not desat, Pk HHR 93  ;llowest pulse ox 90% at 2nd lap but she held her breath, then 92% at 3rd lap 99% -> 91% at end of 3rd lap 94%, Dropped to 87% at 3rd lap, HR peak102/m 97%, dropped to 89% in 3rd lap - asymptomatic. HR 100/min No prob with 54md at [facility] 99% rest - -> dopped to 92% 3rd lap. Pkr HR 107/min 98% rest -> 91% 3rd lap with Pk HR 91/min   CT chest   unchanged          yes Ct mid dec possibly worse - unsure Ct - similar to April 2016 but worse since 2013   CT 06/24/17 - similar to June 2017     Tests/Bx Bronch 0- non diagnostic   VATS - HP with UIP in Upper lobe                   RX    Rx pred start 11/23/12 On  pred 380mper day and will reduce to 2073mn Pred 26m10mr day On pred 10mg73m day On pred 5mg p24mday Rx  pred 5mg da64m She reduced to pred 5mg x p61m 1 month QOD due to GI concern side effects On pred 5mg qod 67m will increase to 5/d after this visit On pred 5mg daily38mOn pred 5mg daily 39mcrease pred to 10mg per da35mduce to pred 7.5 due to skin sissues  pred burs and reduce to 10mg per day43m                       OV 06/17/2017  Chief Complaint  Patient presents with   Follow-up    [name]. Pt denies significant cough, denies CP/tightness, f/c/s.    She is better. Feels she does not need o2. Has been to [facility] for transplant clinic; felt too early. Seems higher dose prednisone helping but  then she is also bettter t his time of the year. In May 2018 I was at [facility] and curbsided national thought leaders - feel that we could explore silent active GERD as a possibility of ILD getting wors with time.   OV 01/11/2018  Chief Complaint  Patient presents with   Follow-up    PFT done today.  Pt states she has been coughing x2 weeks since she returned from a trip to [place]. From coughing, pt has had pain in ribs. Pt states she has mild SOB which is stable due to rehab.  Pt states that Dr. Darron Doom is wanting to know if pt could possibly have eosinophilic esophagitis.   Went to [place]. Returned and has been coughing x 2 weeks. Associated with this is some rib pain on right side. She feels she does not need o2. She feels she does not need pred/abx at this time. She also exposed to flu wit family  OV 02/08/2018  Chief Complaint  Patient presents with   Acute Visit    CXR done 02/07/18.  Pt states her throat is sore but not as bad as it was yesterday when in office and does have some left ear pain. Pt states she has some pain on right rib cage and has increased fatigue.   Ms. Gallant presents acutely.  I last saw her one month ago gave her Tamiflu for prophylaxis after flu exposure.  At that point in time she was already reducing her chronic stable prednisone of 10 mg for  interstitial lung disease/hypersensitivity pneumonitis.  She had tapered herself slowly 3 or 4 weeks ago to 5 mg.  She says she was doing fairly okay but in the last 2 or 3 weeks she has had increased fatigue.  She says she goes daily swimming and then feels energized but then an hour later starts having fatigue which is more than usual.  Also in the daytime she has random fatigue.  In terms of her respiratory symptoms these are stable and in fact slightly better in terms of shortness of breath and cough but she is having sore throat without any fever and some left-sided otalgia.  In addition she is having right-sided chest pains that are more than usual.  The chest pains have been reported before.  These are specifically at localized spots along the previous incision several years ago for interstitial lung disease.  They are tender as well to touch.  This pain is worse.  In addition she is also being bothered by arthralgia in her hand and left hip.  The left hip arthralgias old with a hand arthralgias new.  She is worried about pneumothorax recurrence and so she had a chest x-ray yesterday that shows no pneumothorax.  She has stable interstitial lung disease changes but in my personal visualization these interstitial lung disease changes are worse compared to several years ago.  We do know that she has slowly worsening interstitial lung disease.  In terms of a walking desaturation test this shows stability since last visit.  She has seen GI for acid reflux and has pH probe study coming up  In talking to her she tells me she has been on metformin for over a month or 2.  This is new and is meant for pre-diabetes.  Apparently her hemoglobin A1c is always below 6 but is above 5.5.  Walking desaturation test on 02/08/2018 185 feet x 3 laps on ROOM AIR:  did walk normal pace with forehead probe desaturate. Rest  pulse ox was 99%, final pulse ox was 93%. HR response 72/min at rest to 93/min at peak exertion. Patient  KATERINE MORUA  Did not Desaturate < 88% . Aretha Parrot yes did  Desaturated </= 3% points. Aretha Parrot yes did get tachyardic   Dg Chest 2 View  Result Date: 02/07/2018 CLINICAL DATA:  Chest wall pain for 2 weeks EXAM: CHEST - 2 VIEW COMPARISON:  CT 08/20/2017, radiograph 10/11/2015 FINDINGS: Coarse interstitial pattern consistent with pulmonary fibrosis, similar distribution compared to prior. No focal pulmonary opacity or pleural effusion. Stable cardiomediastinal silhouette with aortic atherosclerosis. No pneumothorax. Degenerative changes of the spine. IMPRESSION: No active cardiopulmonary disease. Similar appearance of diffuse pulmonary fibrosis. Negative for a pneumothorax. Electronically Signed   By: Donavan Foil M.D.   On: 02/07/2018 14:26    OV 04/13/2018  Chief Complaint  Patient presents with   Follow-up    Pt had pre spiro and DLCO PFT prior to OV. Pt has increase of productive cough-thick white in last week. Pt has some wheezing, and allergy issues.   Ms. Freda Munro presents for routine follow-up.  I saw her acutely just approximately 2 months ago.  Then end of April 2019 she saw a nurse practitioner again acutely and was given antibiotic and prednisone.  She says she felt better after that but in the last week or so due to increased pollen load in the community and also moving furniture because her daughter is relocating out of her home she started having more cough.  There is no change in her dyspnea with the cough is really bad.  This is despite Singulair and Chlor-Trimeton.  Those things to help but just take the edge off.  She feels that the pollen making the cough worse.  There is no change in dyspnea.  Walking desaturation test documented below his baseline.  She had pulmonary function test today but the [facility] shows significant decline compared to February 2019 and she feels surprised by this.  DLCO is baseline unchanged and a walking desaturation test limited in the  office is also unchanged.  She does not have any fever but has significant postnasal drip.  She has not followed up at [facility] transplant clinic or the allergist recently.  OV 08/16/2018  Subjective:  Patient ID: Aretha Parrot, female , DOB: [birth year] , age 56 y.o. , MRN: [id number] , ADDRESS: [address] [address]  08/16/2018 -   Chief Complaint  Patient presents with   Follow-up    PFT performed today.  Pt states she has had a lot of mucus the month of September 2019 so she has been taking the Chlor-Trimeton. States other than that she has been able to do a lot more activities and has been swimming a lot more. Pt states she believes the SOB is stable.   HPI ALIS SAWCHUK 69 y.o. -continues to do well.  She is taking higher dose of prednisone 10 mg/day.  Also the summer usually she is doing well.  She is not having much of a cough.  Lung function test shows improvement compared to tests done earlier this year.  In fact she is almost as good as much 2018.  Still compared to 2014-2016 she has progressive lung disease.  Walking desaturation test is stable.  She needs a high-dose flu shot but we do not have stock today.  She has lost some weight intentionally with better dietary control.  She is exercising regularly swimming.  We discussed ofev  potential future therapy if the drug is approved for this indication.  We will know study results in a year or so.  She is open to the idea but is worried somewhat about her irritable bowel syndrome flaring up.  She is no longer following at the [facility] lung transplant clinic given stability lung function  Other issues: She always has right-sided chest wall pain to the site of lung biopsy.  The only way this is been resolved as by her limiting her use of bra .  Also her irritable bowel syndrome is under control but in case it flares up she wants a refill of dicyclomine  OV 12/15/2018  Subjective:  Patient ID: Aretha Parrot, female , DOB: [birth year] , age 8 y.o. , MRN: [id number] , AD[address]ESS: [address] [place] [address]  12/15/2018 -   Chief Complaint  Patient presents with   Follow-up    Pt states she has been doing better since last visit with TP but states she has been having pain in her rib cage x2 years but states it has become more intense.   HPI VALENA IVANOV 69 y.o. -returns for follow-up of her ILD due to hypersensitivity pneumonitis.  She did a clinic visit also for research with the ILD-pro registry study.  Since I last saw her in September 2019 she had a flareup and required higher doses of prednisone.  Since then she is returned to baseline of 5 mg prednisone and she feels good.  She is stable.  Walking desaturation test shows stability.  We have been discussing over the last few months about taking nintedanib based on new data and progressive non--IPF ILD's.  She has trepidation for this because of irritable bowel syndrome.  After much discussion she is willing to try 100 mg once daily for a month and then escalate to 100 mg twice daily which is the therapeutic dose.  She will do this once after insurance approval which we suspect will happen over the next few months.  Her main issue is that her right sided infra-axillary chest pain is getting worse.  This started after her surgical lung biopsy and pneumothorax on the right side.  It is random and happens with twisting.  But now days it happens once every few days.  Previously it used to happen once every few weeks.  Sometimes it is excruciating but then she is left in pain for a few days.  Applying Abrol sudden twisting of her body makes it worse.  Is a lancinating pain.  She did try gabapentin some years ago for chronic cough .  She did tolerate the gabapentin well.  She does not remember if it helped the pain or not but clearly back and the pain was much less intense.  She is willing to try this again.  I recommended a CT scan of the chest  but she is going to have radiation for a CT coronary angiogram because of high levels of coronary artery calcification.  Therefore written to the radiologist to see if he can look at the lung parenchyma with a CT angiogram  OV 05/09/2019  Subjective:  Patient ID: Aretha Parrot, female , DOB: 07-27-1951 , age 24 y.o. , MRN: [id number] , AD[address]ESS: [address] [place] [address]  05/09/2019 -   Chief Complaint  Patient presents with   ILD (Interstitial Lung Disease)   HPI Aretha Parrot 69 y.o. -follow-up for chronic care physician  pneumonitis on daily 5 mg prednisone.  She has problems with right-sided chest wall pain that is neuropathic.  She tells me since her last visit January 2020 she has been isolating and social distancing because of the pandemic.  Overall she is been stable.  Her symptom scores are mild and documented below.  She continues with prednisone.  She was supposed to have pulmonary function test but this was held because of the pandemic.  Her walking desaturation test shows she is stable.  She is supposed to have pulmonary function test tomorrow but wanted to see me today.  In the interim she did have a cardiac CT scan of the chest that shows 94th percentile of coronary calcium.  She could not get a CT angiogram done because of PVC.  A nuclear medicine stress test was done and I reviewed this result and it was normal.  A Holter test is pending.  She is kind of nervous because of the ongoing COVID-19 situation and safety of doing a Holter test.  In terms of her right chest wall pain she is adapted to it.  She does not use any bra  OV 08/22/2019  Subjective:  Patient ID: Aretha Parrot, female , DOB: [birth year] , age 16 y.o. , MRN: [id number] , AD[address]ESS: [address] [place] [address]  08/22/2019 -   Chief Complaint  Patient presents with   Follow-up    needs sx clearance for L hip replacement- not yet scheduled, pending clearance.     Patient lung disease chronic  hypersensitive pneumonitis on chronic daily prednisone 5 mg/day  HPI DANNIKA HILGEMAN 69 y.o. -presents for follow-up of her interstitial lung disease due to chronic hypersensitive pneumonitis.  After last visit in June 2020 given her PFT stability and also her aversion to the potential side effects with nintedanib she decided to just continue with prednisone daily 5 mg/day.  Given the pandemic she is walking 1 mile a day without stopping.  Her interstitial lung disease symptom score is actually slightly better.  Overall she reports stability in her interstitial lung disease.  She has a new issue of getting preoperative clearance for her left hip.  She says the left hip is bone-on-bone and she does not want to take Aleve.  She prefers to have surgery.  Dr. Paralee Cancel is the surgeon.  Surgery date has not been set.  She is very functional.  She has normal renal function.  Normal nutritional status.  No anemia.  No respiratory infections in the last 1 month.  She is not on oxygen.  Age 53.  The surgeries in the left hip and the duration of surgery is under 3 hours.  Anesthesia will be general.  OV 01/30/2020  Subjective:  Patient ID: Aretha Parrot, female , DOB: 1951-08-22 , age 63 y.o. , MRN: [id number] , [address]ESS: [address] [address] Patient lung disease chronic hypersensitive pneumonitis on chronic daily prednisone 5 mg/day  01/30/2020 -   Chief Complaint  Patient presents with   Follow-up    PFT performed 3/8.  Pt states she believes she has been going downhill for the past 2 months. Pt was started on O2 at last OV and wears it prn. Pt does have an occ cough.   HPI MARRIANA HIBBERD 69 y.o. -presents for follow-up of interstitial lung disease secondary to chronic hypersensitive pneumonitis.  She maintains herself on prednisone 5 mg/day.  In the interim she has had hip surgery and this  did wonders for her.  She is able to walk longer distances.  However she does notice that her  dyspnea has declined.  She says since January 2021 his symptoms have declined particularly with cough.  Usually in the winter she goes through a cycle of severe cough and exacerbation that requires higher levels of prednisone.  She has been social distancing well and masking well.  She states there is absolutely no mold or water any antigen exposure at home.  But she feels it is her allergies acting up and making her more symptomatic.  She has had a Covid vaccine and is wondering about either going back to swimming or starting pulmonary rehabilitation.  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  In fact when we walked her today with the mask she did drop down to 89%.  This is a decline for her.  She had pulmonary function test and this shows significant decline in FVC and DLCO.  In review of this that.  Where she has declined this much but is always bounce back with steroids.  At last visit we discussed nintedanib for her as a way to prevent progression of her ILD but given her concerns of GI side effects and the presence of irritable bowel syndrome she is declined.  Infective have this conversation with her including her joining the [employer] at [facility] but she has always been worried about the GI side effects with nintedanib.  This time she is more open to it because of the decline in lung function.  She wants to see an allergist  She also told me that she continues to have a restrictive chest pain particularly on the right side lower area.  She no longer wears a brassiere it has been nearly 3 years since she had a high-resolution CT chest.  She is open to having one now.  Last liver function test was normal in January 2020.  Last renal function was normal in November 2020.  OV 03/19/2020  Subjective:  Patient ID: Aretha Parrot, female , DOB: June 11, 1951 , age 89 y.o. , MRN: [id number] , AD[address]ESS: [address] [place] [address]  Chronic hypersensitive pneumonitis interstitial lung disease on prednisone daily 10 mg 03/19/2020 -   Chief Complaint  Patient presents with   Follow-up    Pt states she is doing better since last visit and states her breathing has also improved.   HPI KRYSTEENA STALKER 69 y.o. -at last visit in March 2021 there was decline in lung function.  Therefore we started nintedanib.  She took 1 tablet of 150 mg and then had immediate abdominal pain and fever.  Therefore she is decided against taking this.  I also support this decision because I do not think she will tolerate nintedanib.  We did extensive interstitial lung disease question a history again at that time.  She had taken 1 dose of nitrofurantoin.  Have indicated to her that she should never take this medicine again.  In addition discovered that she might have some feather jackets at home she has now gotten rid of it.  She is now doing pulmonary rehabilitation.  Her overnight oxygen desaturation test is negative.  At pulmonary rehabilitation she tells me she does not drop below 91% and 1 time she went to 89%.  Otherwise overall doing great.  Her cardiologist is increased her Lopressor.  Her  daughter is getting married and she is busy with the wedding planning this is being emotionally stressful for her.  Her allergist has increased her recommendations and this is also helping her.  Her allergist is retiring and she wanted recommendations for new allergist.  I have recommended Dr. Fredderick Phenix and Dr. Remus Blake.  Incidentally that right-sided chest pain that she had is also better with increased prednisone of 10 mg/day.  She feels prednisone is working really well for her.  OV 08/29/2020  Chief Complaint  Patient presents with   Follow-up    Pt states she had been doing well since last visit up until 3-4 weeks ago and started having problems with her breathing and has had to use her O2 with activities. Pt also has been coughing a  lot and is getting up clear phlegm. Pt also has had some mild wheeze.   HPI MACKENZE GRANDISON 20 y.o. -returns for follow-up.  Since I last saw her her daughter is now married.  She had a great wedding.  She was able to enjoy this and dance quite well.  She barely used her oxygen her effort tolerance was good.  Some 3 [place].  Apparently she had difficulty getting her oxygen tank to clinic to the electrical port.  She noticed with exertion a pulse ox dropping to the 70s.  She said even before she left a few days prior to that she started having increasing cough.  She notices that these cough cycles get exacerbated in the fall in the winter.  Looking back at her pulmonary function test there is always a dip between October and March each year.  Between 2015 and 2017 there was a decline and since then there is a waxing and waning quality with dips in the winter in the fall season.  Each time she responds to prednisone.  Most recent pulmonary function test in August 2021 is actually improved compared to March 2021.  However today she is feeling worse.  She feels something is in her airway.  She feels dry air in the house and the animals bringing leaves from outside is contributing.  She says she feels better upstairs than downstairs where the dogs.  She again denies any mold or mildew.  Denies any feather jacket exposure.  She feels she will benefit from another round of prednisone.  She feels in the past antibiotics have not helped her as much as prednisone.   In terms of chronic ILD HP: She not tolerate nintedanib in the past.  I explained to her that I am concerned about progression and her decline in functional quality and the risk for that.  We discussed pirfenidone as an alternative.  Discussed the fact is less well studied although have no reason to see why it would not be effective.  Explained the side effect profile.  She is willing to try this.   We decided to go donor samples from the patient support group  Oxygen qualification: We walked her and she qualified for oxygen today.  Is for portable oxygen with [employer]  Other issue: Spring allergies: -Her allergist is retired.  We discussed options of referral.  Referred her to Dr. Hardie Pulley referred to    Results for KIYLA, RINGLER (MRN [id number]) as of 08/22/2019 11:56  Ref. Range 12/06/2013 13:03 03/15/2014 15:56 09/18/2014 11:41 02/26/2015 09:02 12/16/2015 16:31 04/29/2016 12:44 01/25/2017 10:16 01/11/2018 15:19 04/13/2018 08:47 08/16/2018 09:00 05/10/2019  09:42 3/8?21 07/18/20  FVC-Pre Latest Units: L 1.86 1.91 1.73 1.84 1.42  1.69 1.61 1.35 1.65 1.62 1.39 1.47  FVC-%Pred-Pre Latest Units: % '69 71 64 72 56 67 65 62 55 67 ' 67 57% 62%   Results for Todisco, Anjanette A (MRN [id number]) as of 08/22/2019 11:56  Ref. Range 12/06/2013 13:03 03/15/2014 15:56 09/18/2014 11:41 02/26/2015 09:02 12/16/2015 16:31 04/29/2016 12:44 01/25/2017 10:16 01/11/2018 15:19 04/13/2018 08:47 08/16/2018 09:00 05/10/2019 09:42 01/29/20 07/18/20  DLCO unc Latest Units: ml/min/mmHg 11.99 11.70 11.53 5.52 10.06 12.12 10.43 10.57 10.16 9.21 10.32 9.36 11.19  DLCO unc % pred Latest Units: % '63 62 61 31 57 64 55 56 57 52 ' 63 57% 68%    PFT Results Latest Ref Rng & Units 07/18/2020 01/29/2020 05/10/2019 08/16/2018 04/13/2018 01/11/2018 01/25/2017  FVC-Pre L 1.47 1.39 1.62 1.65 1.35 1.61 1.69  FVC-Predicted Pre % 62 57 67 67 55 62 65  FVC-Post L - - - - - - -  FVC-Predicted Post % - - - - - - -  Pre FEV1/FVC % % 88 91 89 90 93 91 90  Post FEV1/FCV % % - - - - - - -  FEV1-Pre L 1.30 1.26 1.45 1.49 1.26 1.46 1.53  FEV1-Predicted Pre % 73 69 80 81 68 75 77  FEV1-Post L - - - - - - -  DLCO uncorrected ml/min/mmHg 11.19 9.36 10.32 9.21 10.16 10.57 10.43  DLCO UNC% % 68 57 63 52 57 56 55  DLCO corrected ml/min/mmHg 11.19 9.36 - - - - 10.05  DLCO COR %Predicted % 68 57 - - - - 53  DLVA Predicted % 90 81 93 88 104 95 92  TLC L - - - - - - -  TLC % Predicted  % - - - - - - -  RV % Predicted % - - - - - - -   OV 10/08/2020  Subjective:  Patient ID: Aretha Parrot, female , DOB: 1951/08/10 , age 37 y.o. , MRN: [id number] , [address]: [address] [address] PCP Hayden Rasmussen, MD Patient Care Team: Hayden Rasmussen, MD as PCP - General (Family Medicine) Minus Breeding, MD as PCP - Cardiology (Cardiology) Brand Males, MD as Consulting Physician (Pulmonary Disease) Verl Blalock, Marijo Conception, MD (Inactive) (Cardiology) Kennith Center, RD as Dietitian (Family Medicine) Minus Breeding, MD as Consulting Physician (Cardiology)  This Provider for this visit: Treatment Team:  Attending Provider: Brand Males, MD  10/08/2020 -   Chief Complaint  Patient presents with   Follow-up    Patient states that she is having a lot of pain in right side, o2 low in mornings lately and gets better through out the day, having side effects from esbriet, fatigue, dizziness, nausea. Patient was recently started on Azelastine nasal spray and Xyzal by allergist    HPI Aretha Parrot 68 y.o. - with a PMhx of hypersensitivity pneumonitis who presents today for follow up.   Ms. Dicocco states she has been having some difficulty over the past several weeks since her last follow up. She has noted increased nausea since increasing her dose of Esbriet to the 2 tablets TID. The nausea is described as atypical for her, as she is having regurg without acid reflux. She denies any vomiting. Ms. Kinser has also noted worsening right rib pain, especially with cough or sneezing. The pain is now causing her to have to hold her side before coughing or sneezing. The pain is reproducible with  palpation and does radiate around some towards her back. No similar pain else where.  Ms. Koranda also notes she recently following with her her new allergist, who recommend she stop taking Chlorpheniramine due to concerns that it can lead to dementia development. Her allergist  recommended she begin using a intranasal steroid spray and OTC Claritin instead. She felt this dried her up too much and so she switched back to Chlorpheniramine and feels better. She endorses continued cough that is worst when she wakes up and clears throughout the day after she uses her medication.   Ms. Su Grand also endorses worsening fatigue. She is unsure if it is related to the Esbriet versus stressors in her life that may be leading to some depression. She notes that she feels isolated due to her illnesses. She is also helping her best friend go through cancer treatment.    SYMPTOM SCALE - ILD Sept 2020 01/30/2020  08/29/2020  10/08/2020  O2 use  o2 with ex o2 withe ex 2L  O2 with exertion only  Shortness of Breath  0 -> 5 scale with 5 being worst (score 6 If unable to do)    At rest 0 0 1 2  Simple tasks - showers, clothes change, eating, shaving 01 '1 1 2  ' Household (dishes, doing bed, laundry) 0 '3 3 3  ' Shopping '1 1 1 2  ' Walking level at own pace '1 2 2 2  ' Walking up Stairs '2 3 3 4  ' Total (30-36) Dyspnea Score '5 10 11 15  ' How bad is your cough? '1 3 2 3  ' How bad is your fatigue '1 2 2 3  ' How bad is nausea  0 0 2  How bad is vomiting?   0 0 0  How bad is diarrhea?  00 1 1  How bad is anxiety?  0 0 1  How bad is depression  0 2 3   Simple office walk 185 feet x  3 laps goal with forehead probe 04/13/2018  08/16/2018  12/15/2018  05/09/2019  08/22/2019  01/30/2020  08/29/2020  10/08/2020  O2 used Room air Room air Room air Room air Room air Room air ra Room air   Number laps completed '3 3 3 3 2 ' stopped at 2 die to hip pain 3 laps - no hip pain following hip surgery 3 3 laps   Comments about pace good Moderate pace Normal, hip bothering     None  Resting Pulse Ox/HR 98% and 73/min 98% and HR 77/min 99% and HR 61/min 98% and HR 70/min 98% 98% and 75/min 97% and 67/mi 99% and 73  Final Pulse Ox/HR 91% and 91/min 93% and 92.min 94% and 92/min 93% and 98/min 91%  89% and 93/,imn 88% and 94  93% and 94  Desaturated </= 88% no no no no    No  Desaturated <= 3% points yes yes Yes, 5 points Yes, 5 points    Yes  Got Tachycardic >/= 90/min yes yes yes yes    Yes  Symptoms at end of test none none Hip pain and very mild dyspnea  Stopped due to hip pain Moderate duyspnea with mask Mild to moderate dyspnea Dyspnea   Miscellaneous comments x x    No hip pain      ROS - per HPI   has a past medical history of Allergy, Arthritis, Asthma, Cataract, Coronary artery calcification seen on CAT scan (08/19/2017), DDD (degenerative disc disease), thoracic, Depression, DOE (dyspnea on exertion),  Fatty liver, GERD (gastroesophageal reflux disease), H/O steroid therapy, Heart palpitations (05/25/5328), Helicobacter pylori ab+, Hemorrhoids, Hiatal hernia, High cholesterol, History of chronic bronchitis, History of migraine, History of MRSA infection, Hyperlipidemia, mixed (08/19/2017), Hyperplastic colon polyp (2007), IBS (irritable bowel syndrome), Inguinal hernia, Insulin resistance, Interstitial lung disease ([place]), MVP (mitral valve prolapse), Pneumonia, Pneumonitis, hypersensitivity ([place]), PONV (postoperative nausea and vomiting), Pre-diabetes, Pulmonary fibrosis ([place]), PVC (premature ventricular contraction) (08/19/2017), Rapid heart rate, Squamous cell carcinoma of skin, and Tinnitus, right ear.   reports that she has never smoked. She has never used smokeless tobacco.  Past Surgical History:  Procedure Laterality Date   23 HOUR [place] STUDY N/A 02/21/2018   Procedure: 24 HOUR PH STUDY;  Surgeon: Mauri Pole, MD;  Location: [facility];  Service: Endoscopy;  Laterality: N/A;   BREAST BIOPSY Right 2009   BIOPSY, pt denies   BREAST BIOPSY Left 2003   Benign    CATARACT EXTRACTION Left    CESAREAN SECTION     COLONOSCOPY     ESOPHAGEAL MANOMETRY N/A 02/21/2018   Procedure: ESOPHAGEAL MANOMETRY (EM);  Surgeon: Mauri Pole, MD;  Location: [facility];  Service: Endoscopy;  Laterality: N/A;     FOOT FRACTURE SURGERY  2006 or 2007   right   HYMENECTOMY     LUNG BIOPSY  09/28/2012   Procedure: LUNG BIOPSY;  Surgeon: Melrose Nakayama, MD;  Location: [facility];  Service: Thoracic;  Laterality: N/A;  lung biopsies tims three   SQUAMOUS CELL CARCINOMA EXCISION Left    left arm   TOTAL HIP ARTHROPLASTY Right 09/10/2015   Procedure: RIGHT TOTAL HIP ARTHROPLASTY ANTERIOR APPROACH;  Surgeon: Paralee Cancel, MD;  Location: [facility];  Service: Orthopedics;  Laterality: Right;   TOTAL HIP ARTHROPLASTY Left 10/03/2019   Procedure: TOTAL HIP ARTHROPLASTY ANTERIOR APPROACH;  Surgeon: Paralee Cancel, MD;  Location: [facility];  Service: Orthopedics;  Laterality: Left;  70 mins   TUBAL LIGATION     UPPER GI ENDOSCOPY     VIDEO ASSISTED THORACOSCOPY  09/28/2012   Procedure: VIDEO ASSISTED THORACOSCOPY;  Surgeon: Melrose Nakayama, MD;  Location: [facility];  Service: Thoracic;  Laterality: Right;   VIDEO BRONCHOSCOPY  11/19/2011   Procedure: VIDEO BRONCHOSCOPY WITH FLUORO;  Surgeon: Brand Males, MD;  Location: [facility];  Service: Endoscopy;;    Allergies  Allergen Reactions   Atorvastatin Other (See Comments)    Leg pain   Betadine [Povidone Iodine] Other (See Comments)    blisters   Codeine Nausea And Vomiting   Garlic Diarrhea   Hydrocodone Nausea And Vomiting   Macrobid [Nitrofurantoin] Other (See Comments)   Ofev [Nintedanib] Other (See Comments)    Abdominal pain   Onion Diarrhea   Rosuvastatin Other (See Comments)    Leg pain   Shellfish Allergy Nausea And Vomiting   Sulfa Antibiotics    Sulfonamide Derivatives Other (See Comments)    headaches    Immunization History  Administered Date(s) Administered   Fluad Quad(high Dose 65+) 07/17/2019   Hepatitis A 05/23/2010   Hepatitis B 06/23/2006   Influenza Split 08/11/2012, 08/14/2017   Influenza Whole 09/08/2011   Influenza, High Dose Seasonal PF 08/24/2016, 07/24/2017, 08/31/2018, 08/15/2020   Influenza,inj,Quad PF,6+ Mos  09/07/2013   Influenza-Unspecified 09/26/2014, 07/27/2015   MMR 05/23/2010   PFIZER SARS-COV-2 Vaccination 12/16/2019, 01/08/2020, 07/18/2020   Pneumococcal Conjugate-13 03/15/2014   Pneumococcal Polysaccharide-23 01/11/2018   Td 02/22/2003   Tdap 10/28/2012   Zoster 12/04/2010   Zoster Recombinat (Shingrix) 02/15/2017,  05/17/2017    Family History  Problem Relation Age of Onset   Emphysema Maternal Grandmother    Asthma Maternal Grandmother    Asthma Mother    Osteoarthritis Mother    Dementia Mother 82   Lymphoma Father    Diabetes Father    Hypertension Sister    Heart disease Sister    Kidney disease Sister    Lung disease Maternal Grandfather    Bone cancer Paternal Grandfather    Allergic rhinitis Daughter    Breast cancer Paternal Aunt    Ovarian cancer Maternal Aunt    Breast cancer Cousin    Colon cancer Neg Hx    Angioedema Neg Hx    Eczema Neg Hx    Immunodeficiency Neg Hx    Urticaria Neg Hx      Current Outpatient Medications:    acetaminophen (TYLENOL) 500 MG tablet, Tylenol Extra Strength, Disp: , Rfl:    albuterol (PROAIR HFA) 108 (90 Base) MCG/ACT inhaler, Inhale 2 puffs into the lungs every 4 (four) hours as needed for wheezing. Or coughing spells.  You may use 2 Puffs 5-10 minutes before exercise., Disp: 1 Inhaler, Rfl: 3   Alpha-D-Galactosidase (BEANO PO), Take 1 tablet by mouth as needed (if eating onion or garlic). , Disp: , Rfl:    Ascorbic Acid (VITAMIN C) 1000 MG tablet, Take 1,000 mg by mouth daily., Disp: , Rfl:    aspirin EC 81 MG tablet, Take 81 mg by mouth daily. , Disp: , Rfl:    azelastine (ASTELIN) 0.1 % nasal spray, Place 1 spray into both nostrils at bedtime., Disp: , Rfl:    CALCIUM CITRATE PO, Take 2 tablets by mouth daily., Disp: , Rfl:    chlorpheniramine (CHLOR-TRIMETON) 4 MG tablet, Take 2 mg by mouth 2 (two) times daily. Takes half tablet twice daily, Disp: , Rfl:    dicyclomine (BENTYL) 10 MG capsule, Take 10 mg by mouth as  needed for spasms., Disp: , Rfl:    EPINEPHrine 0.3 mg/0.3 mL IJ SOAJ injection, Inject into the muscle., Disp: , Rfl:    ezetimibe (ZETIA) 10 MG tablet, TAKE 1 TABLET (10 MG TOTAL) BY MOUTH DAILY. PLEASE KEEP UPCOMING APPT FOR FUTURE REFILLS. THANK YOU., Disp: 90 tablet, Rfl: 3   fluticasone (FLONASE) 50 MCG/ACT nasal spray, SPRAY 2 SPRAYS INTO EACH NOSTRIL EVERY DAY, Disp: 48 mL, Rfl: 1   fluticasone furoate-vilanterol (BREO ELLIPTA) 100-25 MCG/INH AEPB, Inhale 1 puff into the lungs daily., Disp: 180 each, Rfl: 3   lactase (LACTAID) 3000 units tablet, Take by mouth., Disp: , Rfl:    levocetirizine (XYZAL) 5 MG tablet, Take 5 mg by mouth at bedtime., Disp: , Rfl:    metFORMIN (GLUCOPHAGE-XR) 500 MG 24 hr tablet, Take 500 mg by mouth daily with supper., Disp: , Rfl:    metoprolol succinate (TOPROL-XL) 50 MG 24 hr tablet, Take 1 tablet (50 mg total) by mouth daily., Disp: 90 tablet, Rfl: 1   montelukast (SINGULAIR) 10 MG tablet, TAKE 1 TABLET BY MOUTH EVERYDAY AT BEDTIME, Disp: 90 tablet, Rfl: 2   Multiple Vitamin (MULTIVITAMIN) tablet, Take 1 tablet by mouth daily., Disp: , Rfl:    nitroGLYCERIN (NITROSTAT) 0.4 MG SL tablet, Place 1 tablet (0.4 mg total) under the tongue every 5 (five) minutes as needed for chest pain., Disp: 25 tablet, Rfl: 12   nystatin-triamcinolone (MYCOLOG II) cream, nystatin-triamcinolone 100,000 unit/g-0.1 % topical cream, Disp: , Rfl:    omeprazole (PRILOSEC) 20 MG capsule, Take 1 capsule (  20 mg total) by mouth daily., Disp: 30 capsule, Rfl: 5   pravastatin (PRAVACHOL) 40 MG tablet, Take 1 tablet (40 mg total) by mouth every evening., Disp: 90 tablet, Rfl: 1   predniSONE (DELTASONE) 10 MG tablet, Take 10 mg by mouth daily with breakfast., Disp: , Rfl:    Probiotic Product (PROBIOTIC ADVANCED PO), Take 2 capsules by mouth daily. , Disp: , Rfl:    Vitamin D, Ergocalciferol, (DRISDOL) 1.25 MG (50000 UNIT) CAPS capsule, Take 50,000 Units by mouth every 7 (seven) days., Disp: ,  Rfl:   Objective:   Vitals:   10/08/20 1043  BP: 130/60  Pulse: 74  Temp: (!) 97.4 F (36.3 C)  TempSrc: Temporal  SpO2: 92%  Weight: 145 lb 9.6 oz (66 kg)  Height: 5' (1.524 m)    Estimated body mass index is 28.44 kg/m as calculated from the following:   Height as of this encounter: 5' (1.524 m).   Weight as of this encounter: 145 lb 9.6 oz (66 kg).  '@WEIGHTCHANGE' @  Filed Weights   10/08/20 1043  Weight: 145 lb 9.6 oz (66 kg)    Physical Exam General: No distress.  Neuro: Alert and Oriented x 3. GCS 15. Speech normal Psych: Pleasant. Teary affect Resp:  No crackles, wheezes or rhonchi.. No overt respiratory distress CVS: Normal heart sounds. Murmurs - Negaitve Ext: Tenderness to palpation along the right ribcage, likely 6th or 7th rib, mid-axillary point. Stigmata of Connective Tissue Disease - Negative HEENT: Normal upper airway. PEERL +. No post nasal drip  Assessment:       ICD-10-CM   1. ILD (interstitial lung disease) ([place])  J84.9 DG Chest 2 View  2. 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 oil pain and ? mold in house  J67.9   3. Right-sided chest wall pain  R07.89 DG Chest 2 View    CANCELED: DG Ribs Unilateral Right     Plan:     Patient Instructions     ICD-10-CM   1. ILD (interstitial lung disease) ([place])  J84.9   2. 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 oil pain and ? mold in house  J67.9   3. Right-sided chest wall pain  R07.89 DG Ribs Unilateral Right   Today we discussed continuing Esbriet as there is significant benefit for your fibrotic lung disease.   Given your history of chronic steroid use, we will check a chest xray to evaluate for rib fractures. If negative, this is likely due to nerve irritation from your previous biopsy.   Plan - Continue taking Prednisone 10 mg daily  - Continue taking Esbriet (donor sample) 2 tablets 3 times per day.    -Take this with food  -Space dosing by at least 5-6 hours  -Apply  sunscreen when you go out  -Recommend over the counter Ginger capsules for the nausea - Start Esbriet paperwork for formal start  - Continue oxygen with exertion - Liver function tests in 4 weeks - Spirometry with DLCO before next appointment  - Xray of the right ribs today  Follow-up - Follow up 4-6 week with Dr. Chase Caller with 30 minute slot after Spirometry completed   - Any problems call us or email [place] sooner  SIGNATURE   Dr. Jose Persia Internal Medicine PGY-2  10/08/2020, 12:03 PM

## 2020-10-08 NOTE — Patient Instructions (Addendum)
ICD-10-CM   1. ILD (interstitial lung disease) ([place])  J84.9   2. 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 oil pain and ? mold in house  J67.9   3. Right-sided chest wall pain  R07.89 DG Ribs Unilateral Right   Today we discussed continuing Esbriet as there is significant benefit for your fibrotic lung disease.   Given your history of chronic steroid use, we will check a chest xray to evaluate for rib fractures. If negative, this is likely due to nerve irritation from your previous biopsy.   Plan - Continue taking Prednisone 10 mg daily  - Continue taking Esbriet (donor sample) 2 tablets 3 times per day.    -Take this with food  -Space dosing by at least 5-6 hours  -Apply sunscreen when you go out  -Recommend over the counter Ginger capsules for the nausea - Start Esbriet paperwork for formal start  - Continue oxygen with exertion - Liver function tests in 4 weeks - Spirometry with DLCO before next appointment  - Xray of the right ribs today  Follow-up - Follow up 4-6 week with Dr. Chase Caller with 30 minute slot after Spirometry completed   - Any problems call us or email [place] sooner

## 2020-10-11 ENCOUNTER — Telehealth: Payer: Self-pay | Admitting: Pharmacy Technician

## 2020-10-11 NOTE — Telephone Encounter (Signed)
Received New start paperwork for ESBRIET. Will update as we work through the benefits process.  Submitted a Prior Authorization request to [facility] for [place] via Cover My Meds. Will update once we receive a response.   KeyConni Elliot - PA Case ID: [id number]

## 2020-10-14 MED ORDER — ESBRIET 267 MG PO CAPS: ORAL_CAPSULE | ORAL | 3 refills | Status: DC

## 2020-10-14 NOTE — Telephone Encounter (Signed)
The esbriet she is taking is patient donor sample. She can only tolerate 2 tab tid. So this is her max dose.  She should not be on esbriet 3 tab tid. When she gets the formal medication - she can just do 1 tab tid for a week and then go back to to 2 tab tid  Each tab = 267mg  dosing

## 2020-10-14 NOTE — Telephone Encounter (Signed)
[facility]

## 2020-10-14 NOTE — Telephone Encounter (Signed)
Received notification from [facility] regarding a prior authorization for ESBRIET 267 tabs. Authorization has been APPROVED from 10/12/20 to 10/12/21.   Authorization # [id number]  Per plan patient must fill through [facility]. Patient is eligible to use a copay card. Patient has been enrolled and card will be mailed to patient.  Copay Card [id number]) [phone]  Reference number: [id number]  Please send Esbriet starter dose and maintenance dose to [facility].

## 2020-10-14 NOTE — Telephone Encounter (Signed)
MR- looks like the pt is taking esbriet 2 tabs tid based on last AVS This msg is asking [place] to send the starter and maintenance dose  Can you please clarify what rx we need to send for her? Thanks!

## 2020-10-31 NOTE — Progress Notes (Signed)
Cardiology Office Note   Date:  11/01/2020   ID:  Rhonda [name], DOB [birth year], MRN [id number]  PCP:  Hayden Rasmussen, MD  Cardiologist:   Minus Breeding, MD   Chief Complaint  Patient presents with  . Shortness of Breath     History of Present Illness: Rhonda [name] is a 69 y.o. female who presents for follow up of coronary calcium and PVCs.  She had a normal nuclear stress test in 2020.  She has premature ventricular contractions.  She has chronic interstitial lung disease.  She does have home O2.  Her calcium score was 343.    Since I last saw her she is now started as Esbriel.  She did not tolerate Ofev.  She is having no acute complaints.  She wears her oxygen in the morning if she gets short of breath.  I did increase her beta-blocker after previous monitor and she really has not felt any palpitations.  She denies any chest pressure, neck or arm discomfort.  She has no presyncope or syncope.  She has reduced her caffeine as well.  She does have some chest wall pain where she had a previous biopsy.  She had hip surgery and that went well.   Past Medical History:  Diagnosis Date  . Allergy   . Arthritis    history spinal stenosis. osteoarthritis right hip  . Asthma   . Cataract   . Coronary artery calcification seen on CAT scan 08/19/2017   >300 on CT scan 08/2017  . DDD (degenerative disc disease), thoracic   . Depression   . DOE (dyspnea on exertion)    a. 04/2010 Lexi MV EF 71%, no ischemia/infarct;    . Fatty liver   . GERD (gastroesophageal reflux disease)   . H/O steroid therapy    Steroid use orally over 4 yrs- for Lung Fibrosis  . Heart palpitations 02/28/2015  . Helicobacter pylori ab+   . Hemorrhoids   . Hiatal hernia   . High cholesterol   . History of chronic bronchitis    as child  . History of migraine   . History of MRSA infection   . Hyperlipidemia, mixed 08/19/2017  . Hyperplastic colon polyp 2007  . IBS (irritable bowel syndrome)   .  Inguinal hernia    right  . Insulin resistance    past  . Interstitial lung disease ([place])   . MVP (mitral valve prolapse)    Posterior mitral valve leaflet with mild MR  . Pneumonia   . Pneumonitis, hypersensitivity ([place])    a. 09/2012 s/p Bx - ? 2/2 bird, mold, oil paint exposure ->on steroids, followed by pulm.  Marland Kitchen PONV (postoperative nausea and vomiting)   . Pre-diabetes    takes Metformin  . Pulmonary fibrosis ([facility])    Dr. Chase Caller follows- stable at present  . PVC (premature ventricular contraction) 08/19/2017  . Rapid heart rate    Dr. Radford Pax follows- last visit Epic note 9'16  . Squamous cell carcinoma of skin   . Tinnitus, right ear     Past Surgical History:  Procedure Laterality Date  . [place] STUDY N/A 02/21/2018   Procedure: [place] STUDY;  Surgeon: Mauri Pole, MD;  Location: [facility];  Service: Endoscopy;  Laterality: N/A;  . BREAST BIOPSY Right 2009   BIOPSY, pt denies  . BREAST BIOPSY Left 2003   Benign   . CATARACT EXTRACTION Left   . CESAREAN SECTION    .  COLONOSCOPY    . ESOPHAGEAL MANOMETRY N/A 02/21/2018   Procedure: ESOPHAGEAL MANOMETRY (EM);  Surgeon: Mauri Pole, MD;  Location: [facility];  Service: Endoscopy;  Laterality: N/A;  . FOOT FRACTURE SURGERY  2006 or 2007   right  . HYMENECTOMY    . LUNG BIOPSY  09/28/2012   Procedure: LUNG BIOPSY;  Surgeon: Melrose Nakayama, MD;  Location: [facility];  Service: Thoracic;  Laterality: N/A;  lung biopsies tims three  . SQUAMOUS CELL CARCINOMA EXCISION Left    left arm  . TOTAL HIP ARTHROPLASTY Right 09/10/2015   Procedure: RIGHT TOTAL HIP ARTHROPLASTY ANTERIOR APPROACH;  Surgeon: Paralee Cancel, MD;  Location: [facility];  Service: Orthopedics;  Laterality: Right;  . TOTAL HIP ARTHROPLASTY Left 10/03/2019   Procedure: TOTAL HIP ARTHROPLASTY ANTERIOR APPROACH;  Surgeon: Paralee Cancel, MD;  Location: [facility];  Service: Orthopedics;  Laterality: Left;  70 mins  . TUBAL LIGATION    . UPPER GI  ENDOSCOPY    . VIDEO ASSISTED THORACOSCOPY  09/28/2012   Procedure: VIDEO ASSISTED THORACOSCOPY;  Surgeon: Melrose Nakayama, MD;  Location: [place] [facility];  Service: Thoracic;  Laterality: Right;  Marland Kitchen VIDEO BRONCHOSCOPY  11/19/2011   Procedure: VIDEO BRONCHOSCOPY WITH FLUORO;  Surgeon: Brand Males, MD;  Location: [facility];  Service: Endoscopy;;     Current Outpatient Medications  Medication Sig Dispense Refill  . acetaminophen (TYLENOL) 500 MG tablet Tylenol Extra Strength    . albuterol (PROAIR HFA) 108 (90 Base) MCG/ACT inhaler Inhale 2 puffs into the lungs every 4 (four) hours as needed for wheezing. Or coughing spells.  You may use 2 Puffs 5-10 minutes before exercise. 1 Inhaler 3  . Alpha-D-Galactosidase (BEANO PO) Take 1 tablet by mouth as needed (if eating onion or garlic).     . Ascorbic Acid (VITAMIN C) 1000 MG tablet Take 1,000 mg by mouth daily.    Marland Kitchen aspirin EC 81 MG tablet Take 81 mg by mouth daily.     Marland Kitchen azelastine (ASTELIN) 0.1 % nasal spray Place 1 spray into both nostrils at bedtime.    Marland Kitchen CALCIUM CITRATE PO Take 2 tablets by mouth daily.    . chlorpheniramine (CHLOR-TRIMETON) 4 MG tablet Take 2 mg by mouth 2 (two) times daily. Takes half tablet twice daily    . dicyclomine (BENTYL) 10 MG capsule Take 10 mg by mouth as needed for spasms.    Marland Kitchen EPINEPHrine 0.3 mg/0.3 mL IJ SOAJ injection Inject into the muscle.    . ezetimibe (ZETIA) 10 MG tablet TAKE 1 TABLET (10 MG TOTAL) BY MOUTH DAILY. PLEASE KEEP UPCOMING APPT FOR FUTURE REFILLS. THANK YOU. 90 tablet 3  . fluticasone (FLONASE) 50 MCG/ACT nasal spray SPRAY 2 SPRAYS INTO EACH NOSTRIL EVERY DAY 48 mL 1  . fluticasone furoate-vilanterol (BREO ELLIPTA) 100-25 MCG/INH AEPB Inhale 1 puff into the lungs daily. 180 each 3  . lactase (LACTAID) 3000 units tablet Take by mouth.    . levocetirizine (XYZAL) 5 MG tablet Take 5 mg by mouth at bedtime.    . metFORMIN (GLUCOPHAGE-XR) 500 MG 24 hr tablet Take 500 mg by mouth daily with  supper.    . metoprolol succinate (TOPROL-XL) 50 MG 24 hr tablet Take 1 tablet (50 mg total) by mouth daily. 90 tablet 1  . montelukast (SINGULAIR) 10 MG tablet TAKE 1 TABLET BY MOUTH EVERYDAY AT BEDTIME 90 tablet 2  . Multiple Vitamin (MULTIVITAMIN) tablet Take 1 tablet by mouth daily.    Marland Kitchen nystatin-triamcinolone [facility]  II) cream nystatin-triamcinolone 100,000 unit/g-0.1 % topical cream    . omeprazole (PRILOSEC) 20 MG capsule Take 1 capsule (20 mg total) by mouth daily. 30 capsule 5  . Pirfenidone (ESBRIET) 267 MG CAPS 1 tablet TID x 1 wk then 2 tablets TID thereafter 180 capsule 3  . pravastatin (PRAVACHOL) 40 MG tablet Take 1 tablet (40 mg total) by mouth every evening. 90 tablet 1  . predniSONE (DELTASONE) 10 MG tablet Take 5 mg by mouth daily with breakfast.    . Probiotic Product (PROBIOTIC ADVANCED PO) Take 2 capsules by mouth daily.     . Vitamin D, Ergocalciferol, (DRISDOL) 1.25 MG (50000 UNIT) CAPS capsule Take 50,000 Units by mouth every 7 (seven) days.    . nitroGLYCERIN (NITROSTAT) 0.4 MG SL tablet Place 1 tablet (0.4 mg total) under the tongue every 5 (five) minutes as needed for chest pain. 25 tablet 12   No current facility-administered medications for this visit.    Allergies:   Atorvastatin, Betadine [povidone iodine], Codeine, Garlic, Hydrocodone, Macrobid [nitrofurantoin], Ofev [nintedanib], Onion, Rosuvastatin, Shellfish allergy, Sulfa antibiotics, and Sulfonamide derivatives    ROS:  Please see the history of present illness.   Otherwise, review of systems are positive for none.   All other systems are reviewed and negative.    PHYSICAL EXAM: VS:  BP 126/70   Pulse 92   Ht 5' 0.5" (1.537 m)   Wt 145 lb (65.8 kg)   SpO2 95%   BMI 27.85 kg/m  , BMI Body mass index is 27.85 kg/m. GENERAL:  Well appearing NECK:  No jugular venous distention, waveform within normal limits, carotid upstroke brisk and symmetric, no bruits, no thyromegaly LUNGS: Bilateral diffuse  fine crackles CHEST:  Unremarkable HEART:  PMI not displaced or sustained,S1 and S2 within normal limits, no S3, no S4, no clicks, no rubs, no murmurs ABD:  Flat, positive bowel sounds normal in frequency in pitch, no bruits, no rebound, no guarding, no midline pulsatile mass, no hepatomegaly, no splenomegaly EXT:  2 plus pulses throughout, no edema, no cyanosis no clubbing    EKG:  EKG is not ordered today.   Recent Labs: 02/06/2020: BUN 24; Creatinine, Ser 0.97; Potassium 4.4; Sodium 135 10/02/2020: ALT 15; Hemoglobin 14.3; Platelets 247.0    Lipid Panel    Component Value Date/Time   CHOL 177 12/08/2018 0917   TRIG 131 12/08/2018 0917   HDL 68 12/08/2018 0917   CHOLHDL 2.6 12/08/2018 0917   CHOLHDL 3.5 02/26/2015 1540   VLDL 37 02/26/2015 1540   LDLCALC 83 12/08/2018 0917      Wt Readings from Last 3 Encounters:  11/01/20 145 lb (65.8 kg)  10/08/20 145 lb 9.6 oz (66 kg)  08/29/20 141 lb 3.2 oz (64 kg)      Other studies Reviewed: Additional studies/ records that were reviewed today include: None Review of the above records demonstrates:  Please see elsewhere in the note.     ASSESSMENT AND PLAN:  Coronary artery calcifications on CT -    She had a low risk nuclear study.   No further testing is indicated.  PVCs -    I ordered a Holter and she had no sustained arrhythmias.  She is doing well on the current dose of beta-blocker.  No change in therapy.   Dyslipidemia -    LDL was 83.  No change in therapy.  Pulmonary HTN -    This is being managed as above.  dication.  Covid Education -  She has had her vaccine.    Current medicines are reviewed at length with the patient today.  The patient does not have concerns regarding medicines.  The following changes have been made:  no change  Labs/ tests ordered today include: None  No orders of the defined types were placed in this encounter.    Disposition:   FU with me in 12 months.     Signed, Minus Breeding, MD  11/01/2020 4:15 PM    [facility]

## 2020-11-01 ENCOUNTER — Ambulatory Visit (INDEPENDENT_AMBULATORY_CARE_PROVIDER_SITE_OTHER): Payer: Medicare Other | Admitting: Cardiology

## 2020-11-01 ENCOUNTER — Encounter: Payer: Self-pay | Admitting: Cardiology

## 2020-11-01 ENCOUNTER — Other Ambulatory Visit: Payer: Self-pay

## 2020-11-01 VITALS — BP 126/70 | HR 92 | Ht 60.5 in | Wt 145.0 lb

## 2020-11-01 DIAGNOSIS — I272 Pulmonary hypertension, unspecified: Secondary | ICD-10-CM | POA: Diagnosis not present

## 2020-11-01 DIAGNOSIS — R931 Abnormal findings on diagnostic imaging of heart and coronary circulation: Secondary | ICD-10-CM

## 2020-11-01 DIAGNOSIS — E785 Hyperlipidemia, unspecified: Secondary | ICD-10-CM | POA: Diagnosis not present

## 2020-11-01 NOTE — Patient Instructions (Signed)
Medication Instructions:  Updated Prednisone directions *If you need a refill on your cardiac medications before your next appointment, please call your pharmacy*  Lab Work: None ordered this visit  Testing/Procedures: None ordered this visit  Follow-Up: At [facility], you and your health needs are our priority.  As part of our continuing mission to provide you with exceptional heart care, we have created designated Provider Care Teams.  These Care Teams include your primary Cardiologist (physician) and Advanced Practice Providers (APPs -  Physician Assistants and Nurse Practitioners) who all work together to provide you with the care you need, when you need it.  Your next appointment:   12 month(s)  You will receive a reminder letter in the mail two months in advance. If you don't receive a letter, please call our office to schedule the follow-up appointment.  The format for your next appointment:   In Person  Provider:   Minus Breeding, MD

## 2020-11-05 ENCOUNTER — Other Ambulatory Visit: Payer: Self-pay | Admitting: Internal Medicine

## 2020-11-05 DIAGNOSIS — J849 Interstitial pulmonary disease, unspecified: Secondary | ICD-10-CM

## 2020-11-05 NOTE — Progress Notes (Signed)
pf

## 2020-11-06 ENCOUNTER — Ambulatory Visit (INDEPENDENT_AMBULATORY_CARE_PROVIDER_SITE_OTHER): Payer: Medicare Other | Admitting: Internal Medicine

## 2020-11-06 ENCOUNTER — Other Ambulatory Visit: Payer: Self-pay

## 2020-11-06 DIAGNOSIS — J849 Interstitial pulmonary disease, unspecified: Secondary | ICD-10-CM | POA: Diagnosis not present

## 2020-11-06 LAB — PULMONARY FUNCTION TEST
DL/VA % pred: 89 %
DL/VA: 3.83 ml/min/mmHg/L
DLCO cor % pred: 64 %
DLCO cor: 10.53 ml/min/mmHg
DLCO unc % pred: 59 %
DLCO unc: 9.7 ml/min/mmHg
FEF 25-75 Pre: 2.43 L/sec
FEF2575-%Pred-Pre: 154 %
FEV1-%Pred-Pre: 73 %
FEV1-Pre: 1.31 L
FEV1FVC-%Pred-Pre: 122 %
FEV6-%Pred-Pre: 62 %
FEV6-Pre: 1.41 L
FEV6FVC-%Pred-Pre: 105 %
FVC-%Pred-Pre: 59 %
FVC-Pre: 1.41 L
Pre FEV1/FVC ratio: 93 %
Pre FEV6/FVC Ratio: 100 %

## 2020-11-06 LAB — HEPATIC FUNCTION PANEL
ALT: 14 U/L (ref 0–35)
AST: 24 U/L (ref 0–37)
Albumin: 3.8 g/dL (ref 3.5–5.2)
Alkaline Phosphatase: 68 U/L (ref 39–117)
Bilirubin, Direct: 0.1 mg/dL (ref 0.0–0.3)
Total Bilirubin: 0.3 mg/dL (ref 0.2–1.2)
Total Protein: 7.1 g/dL (ref 6.0–8.3)

## 2020-11-06 NOTE — Progress Notes (Signed)
Spirometry and Dlco done today. 

## 2020-11-11 ENCOUNTER — Telehealth: Payer: Self-pay | Admitting: Pharmacy Technician

## 2020-11-11 ENCOUNTER — Telehealth: Payer: Self-pay | Admitting: Internal Medicine

## 2020-11-11 NOTE — Telephone Encounter (Signed)
Called and spoke with pt who is requesting to get a smaller oxygen concentrator. Pt said she discussed this with MR at last OV about her purchasing one.  Pt said she talked with [employer] and they do not have one that is smaller than 6lb. Pt is looking at purchasing the simply go mini but wants to know if insurance would help cover it. Stated to her that she needs to contact insurance company and she verbalized understanding. Nothing further needed.

## 2020-11-11 NOTE — Telephone Encounter (Signed)
Called [facility] to check status of patient's Esbriet prescription. Was advised that patient's rx was on hold and rep could not see where patient had been contacted to set up delivery.  Provided [facility] with patient's Esbriet $5.00 copay card information:  BIN- [id number] ID- [id number]  Phone# [phone]  Advised patient needs shipment ASAP. Rep states they will reach out to patient today.  Called and spoke to patient, she is finishing up her samples. Provided patient with phone number to [place] to contact to set up her shipment if she does not get a call by this afternoon.  [facility] Phone# [phone]

## 2020-11-11 NOTE — Telephone Encounter (Signed)
Patient would like to look into getting a smaller Oxygen Concentrator. Requesting a return call to discuss.  Phone# [phone]

## 2020-11-13 ENCOUNTER — Telehealth: Payer: Self-pay | Admitting: Internal Medicine

## 2020-11-13 DIAGNOSIS — J849 Interstitial pulmonary disease, unspecified: Secondary | ICD-10-CM

## 2020-11-13 NOTE — Telephone Encounter (Signed)
Called and spoke with pt letting her know the results of labwork and PFT and she verbalized understanding. F/u appt with MR and for PFT have been scheduled and pt has been told to come in 1 month for repeat labwork. Nothing further needed.

## 2020-11-13 NOTE — Telephone Encounter (Signed)
Rhonda [name]    Let Aretha Parrot know that PFT is on downtred. LFT is normal.   Plan  - continue esbriet and low dose prednisone - repeat LFT in 4 weeks  - repeat spiro/dlco in late feb/ early march 2022 - appt with me30 min in late feb/early march 2022  Thanks  MR

## 2020-11-25 ENCOUNTER — Other Ambulatory Visit: Payer: Self-pay

## 2020-11-25 MED ORDER — METOPROLOL SUCCINATE ER 50 MG PO TB24: 50.0000 mg | ORAL_TABLET | Freq: Every day | ORAL | 3 refills | Status: DC

## 2020-12-03 ENCOUNTER — Other Ambulatory Visit: Payer: Self-pay

## 2020-12-03 ENCOUNTER — Ambulatory Visit (INDEPENDENT_AMBULATORY_CARE_PROVIDER_SITE_OTHER): Payer: Medicare Other | Admitting: Adult Health

## 2020-12-03 ENCOUNTER — Encounter: Payer: Self-pay | Admitting: Adult Health

## 2020-12-03 VITALS — BP 124/60 | HR 77 | Temp 97.6°F | Ht 60.0 in | Wt 146.0 lb

## 2020-12-03 DIAGNOSIS — J4530 Mild persistent asthma, uncomplicated: Secondary | ICD-10-CM

## 2020-12-03 DIAGNOSIS — Z5181 Encounter for therapeutic drug level monitoring: Secondary | ICD-10-CM

## 2020-12-03 DIAGNOSIS — J849 Interstitial pulmonary disease, unspecified: Secondary | ICD-10-CM | POA: Diagnosis not present

## 2020-12-03 DIAGNOSIS — J9611 Chronic respiratory failure with hypoxia: Secondary | ICD-10-CM

## 2020-12-03 DIAGNOSIS — Z79899 Other long term (current) drug therapy: Secondary | ICD-10-CM

## 2020-12-03 LAB — HEPATIC FUNCTION PANEL
ALT: 13 U/L (ref 0–35)
AST: 20 U/L (ref 0–37)
Albumin: 3.9 g/dL (ref 3.5–5.2)
Alkaline Phosphatase: 66 U/L (ref 39–117)
Bilirubin, Direct: 0.1 mg/dL (ref 0.0–0.3)
Total Bilirubin: 0.3 mg/dL (ref 0.2–1.2)
Total Protein: 7.1 g/dL (ref 6.0–8.3)

## 2020-12-03 NOTE — Assessment & Plan Note (Signed)
Appears stable continue on current regimen 

## 2020-12-03 NOTE — Assessment & Plan Note (Signed)
Chronic hypersensitivity pneumonitis-with progressive ILD.  Patient remains on chronic steroids at prednisone 5 mg daily.  Recently started on Esbriet.  Patient did have some increased side effects on regular dose.  She is tolerating decreased dosage well.  We will continue current regimen.  Activity as tolerated.  Hopefully in the next 6 to 8 weeks the pandemic may improve and she will be able to increase her activity especially if she is able to get outside more with warmer weather.  Warmer weather Check LFTs today.  Keep follow-up next month with spirometry with [facility] Plan  Patient Instructions  Continue on BREO 1 puff daily , rinse after use .  Continue on Prednisone 5mg  daily  Continue on Esbriet 2 tabs Three times a day   Labs today  Continue on Oxygen 2l/m with activity .  Activity as tolerated.  Follow up next month as planned and As needed   Please contact office for sooner follow up if symptoms do not improve or worsen or seek emergency care

## 2020-12-03 NOTE — Assessment & Plan Note (Signed)
Patient education on steroids.  Esbriet.  Check LFTs.

## 2020-12-03 NOTE — Patient Instructions (Signed)
Continue on BREO 1 puff daily , rinse after use .  Continue on Prednisone 5mg  daily  Continue on Esbriet 2 tabs Three times a day   Labs today  Continue on Oxygen 2l/m with activity .  Activity as tolerated.  Follow up next month as planned and As needed   Please contact office for sooner follow up if symptoms do not improve or worsen or seek emergency care

## 2020-12-03 NOTE — Progress Notes (Signed)
'@Patient'  ID: Rhonda [name], female    DOB: January 15, 1951, 70 y.o.   MRN: [id number]  Chief Complaint  Patient presents with  . Follow-up    Referring provider: Hayden Rasmussen, MD  HPI: 70 year old female followed for interstitial lung disease-chronic hypersensitivity pneumonitis (possible irritants-bird/old paint/house mold) on daily steroids   TEST/EVENTS :  # Hypersensitivity Pneumonitis and ILD - Potential etiologies - cockateil x 2 for 18 years through end 2012. 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 Denies mold but lives in house built in 1936 with a "weird baselment: and has humidifier - first noted on CT chest 10/15/09 following trip to [place] (PE negative) but not described in 2003 CT chest report -autoimmune panel: 10/23/11: Negative - [place]ennt bronch 11/19/11 - non diagnostic - VATS Nov 2013- CRONIC HYPERSENSITIVITY PNEUMONITIS WITH GRANULOMA.However, there is worrying trend of UIP pattern in the Upper lobes.   Previous [facility] transplant evaluation (felt too early )   12/03/2020 Follow up : ILD , O2 RF  Patient presents for a 72-monthfollow-up.  Patient has underlying interstitial lung disease with chronic hypersensitivity pneumonitis.  She is on chronic steroids with prednisone at 10 mg daily.  She has recently been started on Esbriet for slowly progressive ILD.  After patient for started Esbriet she did have multiple side effects.  She decreased her dose down to 2 capsules 3 times a day.  And says that those side effects have gotten better.  She had some dizziness which has decreased.  Her appetite has improved and she has no vomiting or diarrhea.  Over the last few weeks she did have some episodes of where she felt somewhat depressed and down.  She is not sure if this was related to Esbriet or the holiday season along with the pandemic.  She  says she is feeling much better over the last 2 weeks.  No suicidal ideation.  She is frustrated about not being able to go out and exercise because of the pandemic. She is on oxygen 2 L with activity.  She has a concentrator at home but finds it very difficult to carry as it is heavy.  She is looking into getting a lighter weight portable system.  She is down on the prednisone to 5 mg daily.  Feels that this has helped as well.  She is remaining on Breo daily.  No flare of cough or wheezing.  Activity level is at baseline.    Allergies  Allergen Reactions  . Atorvastatin Other (See Comments)    Leg pain  . Betadine [Povidone Iodine] Other (See Comments)    blisters  . Codeine Nausea And Vomiting  . Garlic Diarrhea  . Hydrocodone Nausea And Vomiting  . Macrobid [Nitrofurantoin] Other (See Comments)  . Ofev [Nintedanib] Other (See Comments)    Abdominal pain  . Onion Diarrhea  . Rosuvastatin Other (See Comments)    Leg pain  . Shellfish Allergy Nausea And Vomiting  . Sulfa Antibiotics   . Sulfonamide Derivatives Other (See Comments)    headaches    Immunization History  Administered Date(s) Administered  . Fluad Quad(high Dose 65+) 07/17/2019  . Hepatitis A 05/23/2010  . Hepatitis B 06/23/2006  . Influenza Split 08/11/2012, 08/14/2017  . Influenza Whole 09/08/2011  . Influenza, High Dose Seasonal PF 08/24/2016, 07/24/2017, 08/31/2018, 08/15/2020  . Influenza,inj,Quad PF,6+ Mos 09/07/2013  . Influenza-Unspecified 09/26/2014, 07/27/2015  . MMR 05/23/2010  .  PFIZER SARS-COV-2 Vaccination 12/16/2019, 01/08/2020, 07/18/2020  . Pneumococcal Conjugate-13 03/15/2014  . Pneumococcal Polysaccharide-23 01/11/2018  . Td 02/22/2003  . Tdap 10/28/2012  . Zoster 12/04/2010  . Zoster Recombinat (Shingrix) 02/15/2017, 05/17/2017    Past Medical History:  Diagnosis Date  . Allergy   . Arthritis    history spinal stenosis. osteoarthritis right hip  . Asthma   . Cataract   . Coronary  artery calcification seen on CAT scan 08/19/2017   >300 on CT scan 08/2017  . DDD (degenerative disc disease), thoracic   . Depression   . DOE (dyspnea on exertion)    a. 04/2010 Lexi MV EF 71%, no ischemia/infarct;    . Fatty liver   . GERD (gastroesophageal reflux disease)   . H/O steroid therapy    Steroid use orally over 4 yrs- for Lung Fibrosis  . Heart palpitations 02/28/2015  . Helicobacter pylori ab+   . Hemorrhoids   . Hiatal hernia   . High cholesterol   . History of chronic bronchitis    as child  . History of migraine   . History of MRSA infection   . Hyperlipidemia, mixed 08/19/2017  . Hyperplastic colon polyp 2007  . IBS (irritable bowel syndrome)   . Inguinal hernia    right  . Insulin resistance    past  . Interstitial lung disease ([place])   . MVP (mitral valve prolapse)    Posterior mitral valve leaflet with mild MR  . Pneumonia   . Pneumonitis, hypersensitivity ([place])    a. 09/2012 s/p Bx - ? 2/2 bird, mold, oil paint exposure ->on steroids, followed by pulm.  Marland Kitchen PONV (postoperative nausea and vomiting)   . Pre-diabetes    takes Metformin  . Pulmonary fibrosis (HCC)    Dr. Chase Caller follows- stable at present  . PVC (premature ventricular contraction) 08/19/2017  . Rapid heart rate    Dr. Radford Pax follows- last visit Epic note 9'16  . Squamous cell carcinoma of skin   . Tinnitus, right ear     Tobacco History: Social History   Tobacco Use  Smoking Status Never Smoker  Smokeless Tobacco Never Used  Tobacco Comment   pt states she experimented in college   Counseling given: Not Answered Comment: pt states she experimented in college   Outpatient Medications Prior to Visit  Medication Sig Dispense Refill  . acetaminophen (TYLENOL) 500 MG tablet Tylenol Extra Strength    . albuterol (PROAIR HFA) 108 (90 Base) MCG/ACT inhaler Inhale 2 puffs into the lungs every 4 (four) hours as needed for wheezing. Or coughing spells.  You may use 2 Puffs 5-10 minutes  before exercise. 1 Inhaler 3  . Alpha-D-Galactosidase (BEANO PO) Take 1 tablet by mouth as needed (if eating onion or garlic).     . Ascorbic Acid (VITAMIN C) 1000 MG tablet Take 1,000 mg by mouth daily.    Marland Kitchen aspirin EC 81 MG tablet Take 81 mg by mouth daily.     Marland Kitchen azelastine (ASTELIN) 0.1 % nasal spray Place 1 spray into both nostrils at bedtime.    Marland Kitchen CALCIUM CITRATE PO Take 2 tablets by mouth daily.    . chlorpheniramine (CHLOR-TRIMETON) 4 MG tablet Take 2 mg by mouth 2 (two) times daily. Takes half tablet twice daily    . dicyclomine (BENTYL) 10 MG capsule Take 10 mg by mouth as needed for spasms.    Marland Kitchen EPINEPHrine 0.3 mg/0.3 mL IJ SOAJ injection Inject into the muscle.    Marland Kitchen  ezetimibe (ZETIA) 10 MG tablet TAKE 1 TABLET (10 MG TOTAL) BY MOUTH DAILY. PLEASE KEEP UPCOMING APPT FOR FUTURE REFILLS. THANK YOU. 90 tablet 3  . fluticasone (FLONASE) 50 MCG/ACT nasal spray SPRAY 2 SPRAYS INTO EACH NOSTRIL EVERY DAY 48 mL 1  . fluticasone furoate-vilanterol (BREO ELLIPTA) 100-25 MCG/INH AEPB Inhale 1 puff into the lungs daily. 180 each 3  . lactase (LACTAID) 3000 units tablet Take by mouth.    . levocetirizine (XYZAL) 5 MG tablet Take 5 mg by mouth at bedtime.    . metFORMIN (GLUCOPHAGE-XR) 500 MG 24 hr tablet Take 500 mg by mouth daily with supper.    . metoprolol succinate (TOPROL-XL) 50 MG 24 hr tablet Take 1 tablet (50 mg total) by mouth daily. 90 tablet 3  . montelukast (SINGULAIR) 10 MG tablet TAKE 1 TABLET BY MOUTH EVERYDAY AT BEDTIME 90 tablet 2  . Multiple Vitamin (MULTIVITAMIN) tablet Take 1 tablet by mouth daily.    Marland Kitchen nystatin-triamcinolone (MYCOLOG II) cream nystatin-triamcinolone 100,000 unit/g-0.1 % topical cream    . omeprazole (PRILOSEC) 20 MG capsule Take 1 capsule (20 mg total) by mouth daily. 30 capsule 5  . Pirfenidone (ESBRIET) 267 MG CAPS 1 tablet TID x 1 wk then 2 tablets TID thereafter 180 capsule 3  . pravastatin (PRAVACHOL) 40 MG tablet Take 1 tablet (40 mg total) by mouth every  evening. 90 tablet 1  . predniSONE (DELTASONE) 10 MG tablet Take 5 mg by mouth daily with breakfast.    . Probiotic Product (PROBIOTIC ADVANCED PO) Take 2 capsules by mouth daily.     . Vitamin D, Ergocalciferol, (DRISDOL) 1.25 MG (50000 UNIT) CAPS capsule Take 50,000 Units by mouth every 7 (seven) days.    . nitroGLYCERIN (NITROSTAT) 0.4 MG SL tablet Place 1 tablet (0.4 mg total) under the tongue every 5 (five) minutes as needed for chest pain. 25 tablet 12   No facility-administered medications prior to visit.     Review of Systems:   Constitutional:   No  weight loss, night sweats,  Fevers, chills, + fatigue, or  lassitude.  HEENT:   No headaches,  Difficulty swallowing,  Tooth/dental problems, or  Sore throat,                No sneezing, itching, ear ache, nasal congestion, post nasal drip,   CV:  No chest pain,  Orthopnea, PND, swelling in lower extremities, anasarca, dizziness, palpitations, syncope.   GI  No heartburn, indigestion, abdominal pain, nausea, vomiting, diarrhea, change in bowel habits, loss of appetite, bloody stools.   Resp:    No chest wall deformity  Skin: no rash or lesions.  GU: no dysuria, change in color of urine, no urgency or frequency.  No flank pain, no hematuria   MS:  No joint pain or swelling.  No decreased range of motion.  No back pain.    Physical Exam  BP 124/60 (BP Location: Left Arm, Patient Position: Sitting, Cuff Size: Normal)   Pulse 77   Temp 97.6 F (36.4 C) (Temporal)   Ht 5' (1.524 m)   Wt 146 lb (66.2 kg)   SpO2 91%   BMI 28.51 kg/m   GEN: A/Ox3; pleasant , NAD, elderly     HEENT:  [place]/AT,   NOSE-clear, THROAT-clear, no lesions, no postnasal drip or exudate noted.   NECK:  Supple w/ fair ROM; no JVD; normal carotid impulses w/o bruits; no thyromegaly or nodules palpated; no lymphadenopathy.    RESP  BB  crackles   no accessory muscle use, no dullness to percussion  CARD:  RRR, no m/r/g, no peripheral edema, pulses  intact, no cyanosis or clubbing.  GI:   Soft & nt; nml bowel sounds; no organomegaly or masses detected.   Musco: Warm bil, no deformities or joint swelling noted.   Neuro: alert, no focal deficits noted.    Skin: Warm, no lesions or rashes    Lab Results:  CBC   BNP No results found for: BNP  ProBNP No results found for: PROBNP  Imaging: No results found.    PFT Results Latest Ref Rng & Units 11/06/2020 07/18/2020 01/29/2020 05/10/2019 08/16/2018 04/13/2018 01/11/2018  FVC-Pre L 1.41 1.47 1.39 1.62 1.65 1.35 1.61  FVC-Predicted Pre % 59 62 57 67 67 55 62  FVC-Post L - - - - - - -  FVC-Predicted Post % - - - - - - -  Pre FEV1/FVC % % 93 88 91 89 90 93 91  Post FEV1/FCV % % - - - - - - -  FEV1-Pre L 1.31 1.30 1.26 1.45 1.49 1.26 1.46  FEV1-Predicted Pre % 73 73 69 80 81 68 75  FEV1-Post L - - - - - - -  DLCO uncorrected ml/min/mmHg 9.70 11.19 9.36 10.32 9.21 10.16 10.57  DLCO UNC% % 59 68 57 63 52 57 56  DLCO corrected ml/min/mmHg 10.53 11.19 9.36 - - - -  DLCO COR %Predicted % 64 68 57 - - - -  DLVA Predicted % 89 90 81 93 88 104 95  TLC L - - - - - - -  TLC % Predicted % - - - - - - -  RV % Predicted % - - - - - - -    Lab Results  Component Value Date   NITRICOXIDE 10 01/13/2016        Assessment & Plan:   ILD (interstitial lung disease) Chronic hypersensitivity pneumonitis-with progressive ILD.  Patient remains on chronic steroids at prednisone 5 mg daily.  Recently started on Esbriet.  Patient did have some increased side effects on regular dose.  She is tolerating decreased dosage well.  We will continue current regimen.  Activity as tolerated.  Hopefully in the next 6 to 8 weeks the pandemic may improve and she will be able to increase her activity especially if she is able to get outside more with warmer weather.  Warmer weather Check LFTs today.  Keep follow-up next month with spirometry with DLCO Plan  Patient Instructions  Continue on BREO 1 puff  daily , rinse after use .  Continue on Prednisone 68m daily  Continue on Esbriet 2 tabs Three times a day   Labs today  Continue on Oxygen 2l/m with activity .  Activity as tolerated.  Follow up next month as planned and As needed   Please contact office for sooner follow up if symptoms do not improve or worsen or seek emergency care       Mild persistent asthma Appears stable continue on current regimen  High risk medication use Patient education on steroids.  Esbriet.  Check LFTs.  Chronic respiratory failure with hypoxia (HCC) Continue on oxygen 2 L to keep O2 saturations greater than 88 to 9%.     TRexene Edison NP 12/03/2020

## 2020-12-03 NOTE — Assessment & Plan Note (Signed)
Continue on oxygen 2 L to keep O2 saturations greater than 88 to 9%.

## 2020-12-09 NOTE — Progress Notes (Signed)
LFT normal  on antifibrotic. No need to call. [place] anti fibrotic

## 2020-12-23 DIAGNOSIS — L249 Irritant contact dermatitis, unspecified cause: Secondary | ICD-10-CM | POA: Diagnosis not present

## 2020-12-23 DIAGNOSIS — I370 Nonrheumatic pulmonary valve stenosis: Secondary | ICD-10-CM | POA: Diagnosis not present

## 2020-12-23 DIAGNOSIS — R7301 Impaired fasting glucose: Secondary | ICD-10-CM | POA: Diagnosis not present

## 2020-12-23 DIAGNOSIS — K219 Gastro-esophageal reflux disease without esophagitis: Secondary | ICD-10-CM | POA: Diagnosis not present

## 2020-12-23 DIAGNOSIS — J8410 Pulmonary fibrosis, unspecified: Secondary | ICD-10-CM | POA: Diagnosis not present

## 2020-12-23 DIAGNOSIS — D849 Immunodeficiency, unspecified: Secondary | ICD-10-CM | POA: Diagnosis not present

## 2020-12-23 DIAGNOSIS — E785 Hyperlipidemia, unspecified: Secondary | ICD-10-CM | POA: Diagnosis not present

## 2020-12-25 DIAGNOSIS — L249 Irritant contact dermatitis, unspecified cause: Secondary | ICD-10-CM | POA: Diagnosis not present

## 2020-12-25 DIAGNOSIS — I471 Supraventricular tachycardia: Secondary | ICD-10-CM | POA: Diagnosis not present

## 2020-12-25 DIAGNOSIS — D849 Immunodeficiency, unspecified: Secondary | ICD-10-CM | POA: Diagnosis not present

## 2020-12-25 DIAGNOSIS — I370 Nonrheumatic pulmonary valve stenosis: Secondary | ICD-10-CM | POA: Diagnosis not present

## 2020-12-25 DIAGNOSIS — J8410 Pulmonary fibrosis, unspecified: Secondary | ICD-10-CM | POA: Diagnosis not present

## 2021-01-04 ENCOUNTER — Other Ambulatory Visit: Payer: Self-pay | Admitting: Medical

## 2021-01-06 NOTE — Telephone Encounter (Signed)
This is Dr. Hochrein's pt. °

## 2021-01-11 ENCOUNTER — Other Ambulatory Visit (HOSPITAL_COMMUNITY)
Admission: RE | Admit: 2021-01-11 | Discharge: 2021-01-11 | Disposition: A | Discharge status: Home / Self Care | Payer: Medicare Other | Source: Ambulatory Visit | Attending: Internal Medicine | Admitting: Internal Medicine

## 2021-01-11 DIAGNOSIS — Z01812 Encounter for preprocedural laboratory examination: Secondary | ICD-10-CM | POA: Diagnosis not present

## 2021-01-11 DIAGNOSIS — Z20822 Contact with and (suspected) exposure to covid-19: Secondary | ICD-10-CM | POA: Diagnosis not present

## 2021-01-11 LAB — SARS CORONAVIRUS 2 (TAT 6-24 HRS): SARS Coronavirus 2: NEGATIVE

## 2021-01-14 ENCOUNTER — Encounter: Payer: Self-pay | Admitting: Internal Medicine

## 2021-01-14 ENCOUNTER — Ambulatory Visit (INDEPENDENT_AMBULATORY_CARE_PROVIDER_SITE_OTHER): Payer: Medicare Other | Admitting: Internal Medicine

## 2021-01-14 ENCOUNTER — Other Ambulatory Visit: Payer: Self-pay

## 2021-01-14 VITALS — BP 114/62 | HR 80 | Temp 97.2°F | Ht <= 58 in | Wt 146.1 lb

## 2021-01-14 DIAGNOSIS — J849 Interstitial pulmonary disease, unspecified: Secondary | ICD-10-CM | POA: Diagnosis not present

## 2021-01-14 DIAGNOSIS — J679 Hypersensitivity pneumonitis due to unspecified organic dust: Secondary | ICD-10-CM

## 2021-01-14 DIAGNOSIS — L299 Pruritus, unspecified: Secondary | ICD-10-CM | POA: Diagnosis not present

## 2021-01-14 DIAGNOSIS — Z79899 Other long term (current) drug therapy: Secondary | ICD-10-CM

## 2021-01-14 DIAGNOSIS — Z5181 Encounter for therapeutic drug level monitoring: Secondary | ICD-10-CM

## 2021-01-14 DIAGNOSIS — J9611 Chronic respiratory failure with hypoxia: Secondary | ICD-10-CM

## 2021-01-14 LAB — PULMONARY FUNCTION TEST
DL/VA % pred: 92 %
DL/VA: 3.99 ml/min/mmHg/L
DLCO cor % pred: 69 %
DLCO cor: 11.26 ml/min/mmHg
DLCO unc % pred: 69 %
DLCO unc: 11.26 ml/min/mmHg
FEF 25-75 Pre: 2.43 L/sec
FEF2575-%Pred-Pre: 154 %
FEV1-%Pred-Pre: 77 %
FEV1-Pre: 1.37 L
FEV1FVC-%Pred-Pre: 122 %
FEV6-%Pred-Pre: 65 %
FEV6-Pre: 1.49 L
FEV6FVC-%Pred-Pre: 105 %
FVC-%Pred-Pre: 62 %
FVC-Pre: 1.49 L
Pre FEV1/FVC ratio: 92 %
Pre FEV6/FVC Ratio: 100 %

## 2021-01-14 NOTE — Patient Instructions (Addendum)
ICD-10-CM   1. 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 oil pain and ? mold in house  J67.9   2. ILD (interstitial lung disease) (HCC)  J84.9 Hepatic function panel  3. Chronic respiratory failure with hypoxia (HCC)  J96.11   4. High risk medication use  Z79.899 Hepatic function panel  5. Medication monitoring encounter  Z51.81 Hepatic function panel  6. Itching  L29.9    Clinically stable but dealing with esbriet side effects of low appetite, fatigue and taste issues  Itching R Flank - unclear and doubt related to esbriet but will be open minded abou tit  Plan Check LFT 01/14/2021 Continue on BREO 1 puff daily , rinse after use .  Continue on Prednisone 5mg  daily  Continue on Esbriet 2 tabs Three times a day  (submaximal but still effective dose)  - we agreed you will soldier on with esbriet but will let us know If intolerant  Continue on Oxygen 2l/m with activity   Activity as tolerated. - ok to swim Take consent for PULSE study - study looks at "movement is life" Vaseline for skin and give feedback if helps    Followup - LFT check  In 3 months  - 3 months routine visit - 30 min visit with Dr Chase Caller

## 2021-01-14 NOTE — Progress Notes (Signed)
Spirometry/DLCO performed today. 

## 2021-01-14 NOTE — Progress Notes (Signed)
#GE reflux with small hiatal hernia  - on ppi   #History of rapid heart rate not otherwise specified  - Start her on Lopressor  2008/2009 by primary care physician. History appears to correlate with onset of respiratory issues  - refuses to dc this drug as of 2014 discussion   # Hypersensitivity Pneumonitis and ILD  - Potential etiologies - cockateil x 2 for 18 years through end 2012. 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 Denies mold but lives in house built in [place] with [name] "weird baselment: and has humidifier  - first noted on CT chest 10/15/09 following trip to [place] (PE negative) but not described in 2003 CT chest report  - autoimmune panel: 10/23/11: Negative  -  Uderwennt bronch 11/19/11  - non diagnostic  - VATS Nov 2013 (done after initially refusing)- [place]. However, there is worrying trend of UIP pattern in the Upper lobes.            Oct/Nov 2012  March 2013 08/11/12  Nov/Dec 2013 Dx HP/UIP 01/02/2013  02/28/13 05/25/2013  12/06/2013  03/15/2014  06/29/2014  10'27/15 12/19/2014  02/26/15 01/13/2016  04/29/2016  10/23/2016  01/25/2017  06/17/2017 [facility] July 2018 01/11/2018  04/13/2018  08/16/2018   Symp/Signs Dyspnea x5y  New crackles  dimished dyspnea except at hill. Improved crackles.  Improved dyspnea except @ hill                 FVC  2.6 L    2.2 L/82%   2.1 L/80%   2.19 L/82%  2.2L/91% 2.1/81% 1.86/69% 1.9L/71% 1.86/75% 1.75L/65% 1.82/73% (dec 2015 offce spiro) 2.84L/72% 1.54/61% 1.78L/67%  1.69L/65%  1.61/62% 1.35/55% 1.65/67%  DLCO  10/50%    10/51%   9.6/51%     11.9/63% 11.7/62%  11.561%  5.5/31% 10.06/57% 11/04/63%  10.43/55% 8.56/50% 10.57/61% 10.16/57% 9.21/52%  Walk test 185 ft x 3 laps on RA  rest 92%. Pk exertion 90%. Pk HR 108 Rest 92%. Pk exertion 86% at 2.5 laps. Pk HR 102  pk exertion - 88% Rest 96%. Pk 95%. Pk HR is 75. Done at full dose  lopressor Rest 94%. Pk 91%. Rest HR 93 with pk HR 103/min. Done at half dose lopressor REst 96%. Pk pulse ox 92%. Rest HR 65. Pk HR 86 Did not desat. Normal Pk HR  Pulse ox 96%, droped to89% at 2nd lap but bounced up and ended at 91% 3rd lap Did not desat Did not desat, Pk HHR 93  ;llowest pulse ox 90% at 2nd lap but she held her breath, then 92% at 3rd lap 99% -> 91% at end of 3rd lap 94%, Dropped to 87% at 3rd lap, HR peak102/m 97%, dropped to 89% in 3rd lap - asymptomatic. HR 100/min No prob with 2md at [facility] 99% rest - -> dopped to 92% 3rd lap. Pkr HR 107/min 98% rest -> 91% 3rd lap with Pk HR 91/min   CT chest   unchanged          yes Ct mid dec possibly worse - unsure Ct - similar to April 2016 but worse since 2013   CT 06/24/17 - similar to June 2017     Tests/Bx Bronch 0- non diagnostic   VATS - HP with UIP in Upper lobe                   RX  Rx pred start 11/23/12 On  pred 19m per day and will reduce to 273mOn Pred 2063mer day On pred 77m63mr day On pred 5mg 73m day Rx pred 5mg d47my She reduced to pred 5mg x 45mt 1 month QOD due to GI concern side effects On pred 5mg qod75mt will increase to 5/d after this visit On pred 5mg dail61m On pred 5mg daily88mncrease pred to 77mg per d32meduce to pred 7.5 due to skin sissues  pred burs and reduce to 77mg per da54m                         OV 06/17/2017  Chief Complaint  Patient presents with  . Follow-up    [name]. Pt denies significant cough, denies CP/tightness, f/c/s.     She is better. Feels she does not need o2. Has been to [facility] for transplant clinic; felt too early. Seems higher dose prednisone helping but then she is also bettter t his time of the year. In May 2018 I was at [facility] and curbsided national thought leaders - feel that we could explore silent active GERD as [name] possibility of ILD getting wors with time.   OV 01/11/2018  Chief Complaint  Patient presents with  . Follow-up    PFT done today.  Pt  states she has been coughing x2 weeks since she returned from [name] trip to [place]. FroDelawareing, pt has had pain in ribs. Pt states she has mild SOB which is stable due to rehab.  Pt states that Rhonda [name] is wDarron Doomto know if pt could possibly have eosinophilic esophagitis.   Went to [place]. RetDelawarend has been coughing x 2 weeks. Associated with this is some rib pain on right side. She feels she does not need o2. She feels she does not need pred/abx at this time. She also exposed to flu wit family   OV 02/08/2018  Chief Complaint  Patient presents with  . Acute Visit    CXR done 02/07/18.  Pt states her throat is sore but not as bad as it was yesterday when in office and does have some left ear pain. Pt states she has some pain on right rib cage and has increased fatigue.     Rhonda [name] preKiernanutely.  I last saw her one month ago gave her Tamiflu for prophylaxis after flu exposure.  At that point in time she was already reducing her chronic stable prednisone of 10 mg for interstitial lung disease/hypersensitivity pneumonitis.  She had tapered herself slowly 3 or 4 weeks ago to 5 mg.  She says she was doing fairly okay but in the last 2 or 3 weeks she has had increased fatigue.  She says she goes daily swimming and then feels energized but then an hour later starts having fatigue which is more than usual.  Also in the daytime she has random fatigue.  In terms of her respiratory symptoms these are stable and in fact slightly better in terms of shortness of breath and cough but she is having sore throat without any fever and some left-sided otalgia.  In addition she is having right-sided chest pains that are more than usual.  The chest pains have been reported before.  These are specifically at localized spots along the previous incision several years ago for interstitial lung disease.  They are tender as well  to touch.  This pain is worse.  In addition she is also being bothered by arthralgia  in her hand and left hip.  The left hip arthralgias old with [name] hand arthralgias new.  She is worried about pneumothorax recurrence and so she had [name] chest x-ray yesterday that shows no pneumothorax.  She has stable interstitial lung disease changes but in my personal visualization these interstitial lung disease changes are worse compared to several years ago.  We do know that she has slowly worsening interstitial lung disease.  In terms of [name] walking desaturation test this shows stability since last visit.  She has seen GI for acid reflux and has pH probe study coming up  In talking to her she tells me she has been on metformin for over [name] month or 2.  This is new and is meant for pre-diabetes.  Apparently her hemoglobin A1c is always below 6 but is above 5.5.  Walking desaturation test on 02/08/2018 185 feet x 3 laps on ROOM AIR:  did walk normal pace with forehead probe desaturate. Rest pulse ox was 99%, final pulse ox was 93%. HR response 72/min at rest to 93/min at peak exertion. [name]  Did not Desaturate < 88% . Rhonda [name] yes did  Desaturated </= 3% points. Rhonda [name] yes did get tachyardic   Dg Chest 2 View  Result Date: 02/07/2018 CLINICAL DATA:  Chest wall pain for 2 weeks EXAM: CHEST - 2 VIEW COMPARISON:  CT 08/20/2017, radiograph 10/11/2015 FINDINGS: Coarse interstitial pattern consistent with pulmonary fibrosis, similar distribution compared to prior. No focal pulmonary opacity or pleural effusion. Stable cardiomediastinal silhouette with aortic atherosclerosis. No pneumothorax. Degenerative changes of the spine. IMPRESSION: No active cardiopulmonary disease. Similar appearance of diffuse pulmonary fibrosis. Negative for [name] pneumothorax. Electronically Signed   By: Donavan Foil M.D.   On: 02/07/2018 14:26    OV 04/13/2018  Chief Complaint  Patient presents with  . Follow-up    Pt had pre spiro and DLCO PFT prior to OV. Pt has increase of productive  cough-thick white in last week. Pt has some wheezing, and allergy issues.   Ms. Freda Munro presents for routine follow-up.  I saw her acutely just approximately 2 months ago.  Then end of April 2019 she saw [name] nurse practitioner again acutely and was given antibiotic and prednisone.  She says she felt better after that but in the last week or so due to increased pollen load in the community and also moving furniture because her daughter is relocating out of her home she started having more cough.  There is no change in her dyspnea with the cough is really bad.  This is despite Singulair and Chlor-Trimeton.  Those things to help but just take the edge off.  She feels that the pollen making the cough worse.  There is no change in dyspnea.  Walking desaturation test documented below his baseline.  She had pulmonary function test today but the [facility] shows significant decline compared to February 2019 and she feels surprised by this.  DLCO is baseline unchanged and [name] walking desaturation test limited in the office is also unchanged.  She does not have any fever but has significant postnasal drip.  She has not followed up at [facility] transplant clinic or the allergist recently.  OV 08/16/2018  Subjective:  Patient ID: Rhonda [name], female , DOB: [birth year] , age 15 y.o. , MRN: [id number] , ADDRESS: [address]  [place] [address]   08/16/2018 -   Chief Complaint  Patient presents with  . Follow-up    PFT performed today.  Pt states she has had [name] lot of mucus the month of September 2019 so she has been taking the Chlor-Trimeton. States other than that she has been able to do [name] lot more activities and has been swimming [name] lot more. Pt states she believes the SOB is stable.     HPI Rhonda [name] 70 y.o. -continues to do well.  She is taking higher dose of prednisone 10 mg/day.  Also the summer usually she is doing well.  She is not having much of [name] cough.  Lung function test shows improvement  compared to tests done earlier this year.  In fact she is almost as good as much 2018.  Still compared to 2014-2016 she has progressive lung disease.  Walking desaturation test is stable.  She needs [name] high-dose flu shot but we do not have stock today.  She has lost some weight intentionally with better dietary control.  She is exercising regularly swimming.  We discussed ofev  potential future therapy if the drug is approved for this indication.  We will know study results in [name] year or so.  She is open to the idea but is worried somewhat about her irritable bowel syndrome flaring up.  She is no longer following at the [facility] lung transplant clinic given stability lung function  Other issues: She always has right-sided chest wall pain to the site of lung biopsy.  The only way this is been resolved as by her limiting her use of bra .  Also her irritable bowel syndrome is under control but in case it flares up she wants [name] refill of dicyclomine     OV 12/15/2018  Subjective:  Patient ID: Rhonda [name], female , DOB: 1951/04/24 , age 18 y.o. , MRN: [id number] , AD[address]ESS: [address] [address]   12/15/2018 -   Chief Complaint  Patient presents with  . Follow-up    Pt states she has been doing better since last visit with TP but states she has been having pain in her rib cage x2 years but states it has become more intense.     HPI Rhonda [name] 70 y.o. -returns for follow-up of her ILD due to hypersensitivity pneumonitis.  She did [name] clinic visit also for research with the ILD-pro registry study.  Since I last saw her in September 2019 she had [name] flareup and required higher doses of prednisone.  Since then she is returned to baseline of 5 mg prednisone and she feels good.  She is stable.  Walking desaturation test shows stability.  We have been discussing over the last few months about taking nintedanib based on new data and progressive non--IPF ILD's.  She has trepidation for  this because of irritable bowel syndrome.  After much discussion she is willing to try 100 mg once daily for [name] month and then escalate to 100 mg twice daily which is the therapeutic dose.  She will do this once after insurance approval which we suspect will happen over the next few months.  Her main issue is that her right sided infra-axillary chest pain is getting worse.  This started after her surgical lung biopsy and pneumothorax on the right side.  It is random and happens with twisting.  But now days it happens once every few days.  Previously it used to happen  once every few weeks.  Sometimes it is excruciating but then she is left in pain for [name] few days.  Applying Abrol sudden twisting of her body makes it worse.  Is [name] lancinating pain.  She did try gabapentin some years ago for chronic cough .  She did tolerate the gabapentin well.  She does not remember if it helped the pain or not but clearly back and the pain was much less intense.  She is willing to try this again.  I recommended [name] CT scan of the chest but she is going to have radiation for [name] CT coronary angiogram because of high levels of coronary artery calcification.  Therefore written to the radiologist to see if he can look at the lung parenchyma with [name] CT angiogram    OV 05/09/2019  Subjective:  Patient ID: Rhonda [name], female , DOB: [birth year] , age 89 y.o. , MRN: [id number] , ADDRESS: [address] [place] [address]   05/09/2019 -   Chief Complaint  Patient presents with  . ILD (Interstitial Lung Disease)     HPI Rhonda [name] 70 y.o. -follow-up for chronic care physician pneumonitis on daily 5 mg prednisone.  She has problems with right-sided chest wall pain that is neuropathic.  She tells me since her last visit January 2020 she has been isolating and social distancing because of the pandemic.  Overall she is been stable.  Her symptom scores are mild and documented below.  She continues with prednisone.  She was  supposed to have pulmonary function test but this was held because of the pandemic.  Her walking desaturation test shows she is stable.  She is supposed to have pulmonary function test tomorrow but wanted to see me today.  In the interim she did have [name] cardiac CT scan of the chest that shows 94th percentile of coronary calcium.  She could not get [name] CT angiogram done because of PVC.  [name] nuclear medicine stress test was done and I reviewed this result and it was normal.  [name] Holter test is pending.  She is kind of nervous because of the ongoing COVID-19 situation and safety of doing [name] Holter test.  In terms of her right chest wall pain she is adapted to it.  She does not use any bra  OV 08/22/2019  Subjective:  Patient ID: Rhonda [name], female , DOB: [birth year] , age 64 y.o. , MRN: [id number] , AD[address]ESS: [address] [address]   08/22/2019 -   Chief Complaint  Patient presents with  . Follow-up    needs sx clearance for L hip replacement- not yet scheduled, pending clearance.     Patient lung disease chronic hypersensitive pneumonitis on chronic daily prednisone 5 mg/day  HPI SAMIE BARCLIFT 70 y.o. -presents for follow-up of her interstitial lung disease due to chronic hypersensitive pneumonitis.  After last visit in June 2020 given her PFT stability and also her aversion to the potential side effects with nintedanib she decided to just continue with prednisone daily 5 mg/day.  Given the pandemic she is walking 1 mile [name] day without stopping.  Her interstitial lung disease symptom score is actually slightly better.  Overall she reports stability in her interstitial lung disease.  She has [name] new issue of getting preoperative clearance for her left hip.  She says the left hip is bone-on-bone and she does not want to take Aleve.  She prefers to have surgery.  Dr. Paralee Cancel is  the surgeon.  Surgery date has not been set.  She is very functional.  She has normal renal function.  Normal  nutritional status.  No anemia.  No respiratory infections in the last 1 month.  She is not on oxygen.  Age 16.  The surgeries in the left hip and the duration of surgery is under 3 hours.  Anesthesia will be general.      OV 01/30/2020  Subjective:  Patient ID: Rhonda [name], female , DOB: [birth year] , age 46 y.o. , MRN: [id number] , AD[address]ESS: [address] [address] Patient lung disease chronic hypersensitive pneumonitis on chronic daily prednisone 5 mg/day  01/30/2020 -   Chief Complaint  Patient presents with  . Follow-up    PFT performed 3/8.  Pt states she believes she has been going downhill for the past 2 months. Pt was started on O2 at last OV and wears it prn. Pt does have an occ cough.     HPI Rhonda [name] 70 y.o. -presents for follow-up of interstitial lung disease secondary to chronic hypersensitive pneumonitis.  She maintains herself on prednisone 5 mg/day.  In the interim she has had hip surgery and this did wonders for her.  She is able to walk longer distances.  However she does notice that her dyspnea has declined.  She says since January 2021 his symptoms have declined particularly with cough.  Usually in the winter she goes through [name] cycle of severe cough and exacerbation that requires higher levels of prednisone.  She has been social distancing well and masking well.  She states there is absolutely no mold or water any antigen exposure at home.  But she feels it is her allergies acting up and making her more symptomatic.  She has had [name] Covid vaccine and is wondering about either going back to swimming or starting pulmonary rehabilitation.  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  In fact when we walked her today with the mask she did drop down to 89%.  This is [name] decline for her.  She had pulmonary function test and this shows significant decline in FVC and DLCO.  In  review of this that.  Where she has declined this much but is always bounce back with steroids.  At last visit we discussed nintedanib for her as [name] way to prevent progression of her ILD but given her concerns of GI side effects and the presence of irritable bowel syndrome she is declined.  Infective have this conversation with her including her joining the [employer] at [facility] but she has always been worried about the GI side effects with nintedanib.  This time she is more open to it because of the decline in lung function.  She wants to see an allergist  She also told me that she continues to have [name] restrictive chest pain particularly on the right side lower area.  She no longer wears [name] brassiere it has been nearly 3 years since she had [name] high-resolution CT chest.  She is open to having one now.  Last liver function test was normal in January 2020.  Last renal function was normal in November 2020.      OV 03/19/2020  Subjective:  Patient ID: Rhonda [name], female , DOB: [birth year] , age 63 y.o. , MRN: [id number] , ADDRESS: [address] [place] [address]  Chronic  hypersensitive pneumonitis interstitial lung disease on prednisone daily 10 mg 03/19/2020 -   Chief Complaint  Patient presents with  . Follow-up    Pt states she is doing better since last visit and states her breathing has also improved.     HPI Rhonda [name] 70 y.o. -at last visit in March 2021 there was decline in lung function.  Therefore we started nintedanib.  She took 1 tablet of 150 mg and then had immediate abdominal pain and fever.  Therefore she is decided against taking this.  I also support this decision because I do not think she will tolerate nintedanib.  We did extensive interstitial lung disease question [name] history again at that time.  She had taken 1 dose of nitrofurantoin.  Have indicated to her that she should never take this medicine again.  In addition discovered that she might have some feather jackets  at home she has now gotten rid of it.  She is now doing pulmonary rehabilitation.  Her overnight oxygen desaturation test is negative.  At pulmonary rehabilitation she tells me she does not drop below 91% and 1 time she went to 89%.  Otherwise overall doing great.  Her cardiologist is increased her Lopressor.  Her daughter is getting married and she is busy with the wedding planning this is being emotionally stressful for her.  Her allergist has increased her recommendations and this is also helping her.  Her allergist is retiring and she wanted recommendations for new allergist.  I have recommended Dr. Fredderick Phenix and Dr. Remus Blake.  Incidentally that right-sided chest pain that she had is also better with increased prednisone of 10 mg/day.  She feels prednisone is working really well for her.    OV 08/29/2020   Subjective:  Patient ID: Rhonda [name], female , DOB: [birth year], age 40 y.o. years. , MRN: [id number],  ADDRESS: [address] [address] PCP  Hayden Rasmussen, MD Providers : Treatment Team:  Attending Provider: Brand Males, MD Patient Care Team: Hayden Rasmussen, MD as PCP - General (Family Medicine) Minus Breeding, MD as PCP - Cardiology (Cardiology) Brand Males, MD as Consulting Physician (Pulmonary Disease) Verl Blalock, Marijo Conception, MD (Inactive) (Cardiology) Kennith Center, RD as Dietitian (Family Medicine) Minus Breeding, MD as Consulting Physician (Cardiology)  Follow-up chronic hypersensitive pneumonitis on chronic prednisone 10 mg/day.  Last high-resolution CT chest March 2021.  Intolerant to nintedanib.  Chief Complaint  Patient presents with  . Follow-up    Pt states she had been doing well since last visit up until 3-4 weeks ago and started having problems with her breathing and has had to use her O2 with activities. Pt also has been coughing [name] lot and is getting up clear phlegm. Pt also has had some mild wheeze.       HPI Rhonda [name] 70 y.o.  -returns for follow-up.  Since I last saw her her daughter is now married.  She had [name] great wedding.  She was able to enjoy this and dance quite well.  She barely used her oxygen her effort tolerance was good.  Some 3 [place].  Apparently she had difficulty getting her oxygen tank to clinic to the electrical port.  She noticed with exertion [name] pulse ox dropping to the 70s.  She said even before she left [name] few days prior to that she started having increasing cough.  She notices that these cough  cycles get exacerbated in the fall in the winter.  Looking back at her pulmonary function test there is always [name] dip between October and March each year.  Between 2015 and 2017 there was [name] decline and since then there is [name] waxing and waning quality with dips in the winter in the fall season.  Each time she responds to prednisone.  Most recent pulmonary function test in August 2021 is actually improved compared to March 2021.  However today she is feeling worse.  She feels something is in her airway.  She feels dry air in the house and the animals bringing leaves from outside is contributing.  She says she feels better upstairs than downstairs where the dogs.  She again denies any mold or mildew.  Denies any feather jacket exposure.  She feels she will benefit from another round of prednisone.  She feels in the past antibiotics have not helped her as much as prednisone.   In terms of chronic ILD HP: She not tolerate nintedanib in the past.  I explained to her that I am concerned about progression and her decline in functional quality and the risk for that.  We discussed pirfenidone as an alternative.  Discussed the fact is less well studied although have no reason to see why it would not be effective.  Explained the side effect profile.  She is willing to try this.  We decided to go donor samples from the patient support group  Oxygen qualification: We walked her and  she qualified for oxygen today.  Is for portable oxygen with [employer]  Other issue: Spring allergies: -Her allergist is retired.  We discussed options of referral.  Referred her to Dr. Hardie Pulley referred to   ROS - per HPI    -     PFT Results Latest Ref Rng & Units 07/18/2020 01/29/2020 05/10/2019 08/16/2018 04/13/2018 01/11/2018 01/25/2017  FVC-Pre L 1.47 1.39 1.62 1.65 1.35 1.61 1.69  FVC-Predicted Pre % 62 57 67 67 55 62 65  FVC-Post L - - - - - - -  FVC-Predicted Post % - - - - - - -  Pre FEV1/FVC % % 88 91 89 90 93 91 90  Post FEV1/FCV % % - - - - - - -  FEV1-Pre L 1.30 1.26 1.45 1.49 1.26 1.46 1.53  FEV1-Predicted Pre % 73 69 80 81 68 75 77  FEV1-Post L - - - - - - -  DLCO uncorrected ml/min/mmHg 11.19 9.36 10.32 9.21 10.16 10.57 10.43  DLCO UNC% % 68 57 63 52 57 56 55  DLCO corrected ml/min/mmHg 11.19 9.36 - - - - 10.05  DLCO COR %Predicted % 68 57 - - - - 53  DLVA Predicted % 90 81 93 88 104 95 92  TLC L - - - - - - -  TLC % Predicted % - - - - - - -  RV % Predicted % - - - - - - -    ROS - per HPI    OV 01/14/2021  Subjective:  Patient ID: Rhonda [name], female , DOB: 1951/09/12 , age 3 y.o. , MRN: [id number] , ADDRESS: [address] [address] PCP Hayden Rasmussen, MD Patient Care Team: Hayden Rasmussen, MD as PCP - General (Family Medicine) Minus Breeding, MD as PCP - Cardiology (Cardiology) Brand Males, MD as Consulting Physician (Pulmonary Disease) Verl Blalock, Marijo Conception, MD (Inactive) (Cardiology) Kennith Center, RD as Dietitian (Family Medicine) Minus Breeding,  MD as Consulting Physician (Cardiology)  This Provider for this visit: Treatment Team:  Attending Provider: Brand Males, MD    01/14/2021 -   Chief Complaint  Patient presents with  . Follow-up    Nauseous, loss of appetite    Follow-up chronic hypersensitive pneumonitis on prednisone 5 mg/day and also pirfenidone submaximal dose of 2 pills 3 times daily since October 2021.   On ILD-pro registry protocol  She is also on Breo  HPI Rhonda [name] 70 y.o. -returns for follow-up.  She is now on pirfenidone along with low-dose prednisone.  She feels the pirfenidone is not reacting well with her at all.  She has intermittent loss of taste at least twice [name] week.  She has loss of appetite and she has forces herself to eat.  She also has fatigue.  She says that if I strongly recommended then she will continue to soldier on with it.  Otherwise she feels less short of breath.  Pulmonary function test reviewed and is actually stable/slightly improved.  She says the cough is extremely well controlled except today after the pulmonary function test she was coughing quite [name] bit.  She is looking forward to going back to swimming.  She has had [name] Covid vaccine in booster and also status post EVUSHELD monoclonal antibody prophylaxis.  We discussed the possibility about possibility of enrolling in inhaled nitric oxide device/administration study.  This is to see if people can improve in the shortness of breath scale and also physical activity.  She took the consent form and is deliberating.  She thinks she might find the study and inconvenience to her lifestyle where she is wanting to be more active.  We went over several details of this research protocol  She has [name] new symptom of like itching sensation deep in the right flank.  She feels it is not the skin it is from deep inside.  She is wondering if it could be related to pirfenidone.  Started after going on pirfenidone.  Explained to her that I am not seeing this in other patients with pirfenidone but will have to be open minded.  She does not apply Vaseline to her skin in the winter.  We discussed the possibility this could be dry skin related but she does not have other features of dry skin.  Nevertheless she is willing to try Vaseline.     SYMPTOM SCALE - ILD Sept 2020 01/30/2020  08/29/2020  01/14/2021 146#  O2 use  o2 with ex o2 withe  ex 2L    Shortness of Breath  0 -> 5 scale with 5 being worst (score 6 If unable to do)    At rest 0 0 1 0  Simple tasks - showers, clothes change, eating, shaving 01 1 1 1.5  Household (dishes, doing bed, laundry) 0 '3 3 3  ' Shopping '1 1 1 2  ' Walking level at own pace '1 2 2 1  ' Walking up Stairs '2 3 3 3  ' Total (30-36) Dyspnea Score '5 10 11 ' 10.5  How bad is your cough? '1 3 2 2  ' How bad is your fatigue '1 2 2 3  ' How bad is nausea  0 0 3  How bad is vomiting?   0 0 0  How bad is diarrhea?  00 1 0  How bad is anxiety?  0 0 0  How bad is depression  0 2 1    Results for Nobrega, Raniyah [name] (MRN [id number]) as of  08/22/2019 11:56  Ref. Range 12/06/2013 13:03 03/15/2014 15:56 09/18/2014 11:41 02/26/2015 09:02 12/16/2015 16:31 04/29/2016 12:44 01/25/2017 10:16 01/11/2018 15:19 04/13/2018 08:47 08/16/2018 09:00 05/10/2019 09:42 3/8?21 07/18/20  FVC-Pre Latest Units: L 1.86 1.91 1.73 1.84 1.42  1.69 1.61 1.35 1.65 1.62 1.39 1.47  FVC-%Pred-Pre Latest Units: % '69 71 64 72 56 67 65 62 55 67 ' 67 57% 62%   Results for Blizard, Rhonda [name] (MRN [id number]) as of 08/22/2019 11:56  Ref. Range 12/06/2013 13:03 03/15/2014 15:56 09/18/2014 11:41 02/26/2015 09:02 12/16/2015 16:31 04/29/2016 12:44 01/25/2017 10:16 01/11/2018 15:19 04/13/2018 08:47 08/16/2018 09:00 05/10/2019 09:42 01/29/20 07/18/20  DLCO unc Latest Units: ml/min/mmHg 11.99 11.70 11.53 5.52 10.06 12.12 10.43 10.57 10.16 9.21 10.32 9.36 11.19  DLCO unc % pred Latest Units: % '63 62 61 31 57 64 55 56 57 52 ' 63 57% 68%    Simple office walk 185 feet x  3 laps goal with forehead probe 04/13/2018  08/16/2018  12/15/2018  05/09/2019  08/22/2019  01/30/2020  08/29/2020  01/14/2021   O2 used Room air Room air Room air Room air Room air Room air ra ra  Number laps completed '3 3 3 3 2 ' stopped at 2 die to hip pain 3 laps - no hip pain following hip surgery 3 3  Comments about pace good Moderate pace Normal, hip bothering     avg pace  Resting Pulse Ox/HR 98% and 73/min 98% and HR 77/min 99%  and HR 61/min 98% and HR 70/min 98% 98% and 75/min 97% and 67/mi 94% and 80/min  Final Pulse Ox/HR 91% and 91/min 93% and 92.min 94% and 92/min 93% and 98/min 91%  89% and 93/,imn 88% and 94 89% ad 88.nin  Desaturated </= 88% no no no no      Desaturated <= 3% points yes yes Yes, 5 points Yes, 5 points    Yes, 5 points  Got Tachycardic >/= 90/min yes yes yes yes      Symptoms at end of test none none Hip pain and very mild dyspnea  Stopped due to hip pain Moderate duyspnea with mask Mild to moderate dyspnea   Miscellaneous comments x x    No hip pain        PFT  PFT Results Latest Ref Rng & Units 01/14/2021 11/06/2020 07/18/2020 01/29/2020 05/10/2019 08/16/2018 04/13/2018  FVC-Pre L 1.49 1.41 1.47 1.39 1.62 1.65 1.35  FVC-Predicted Pre % 62 59 62 57 67 67 55  FVC-Post L - - - - - - -  FVC-Predicted Post % - - - - - - -  Pre FEV1/FVC % % 92 93 88 91 89 90 93  Post FEV1/FCV % % - - - - - - -  FEV1-Pre L 1.37 1.31 1.30 1.26 1.45 1.49 1.26  FEV1-Predicted Pre % 77 73 73 69 80 81 68  FEV1-Post L - - - - - - -  DLCO uncorrected ml/min/mmHg 11.26 9.70 11.19 9.36 10.32 9.21 10.16  DLCO UNC% % 69 59 68 57 63 52 57  DLCO corrected ml/min/mmHg 11.26 10.53 11.19 9.36 - - -  DLCO COR %Predicted % 69 64 68 57 - - -  DLVA Predicted % 92 89 90 81 93 88 104  TLC L - - - - - - -  TLC % Predicted % - - - - - - -  RV % Predicted % - - - - - - -       has [name] past medical history of  Allergy, Arthritis, Asthma, Cataract, Coronary artery calcification seen on CAT scan (08/19/2017), DDD (degenerative disc disease), thoracic, Depression, DOE (dyspnea on exertion), Fatty liver, GERD (gastroesophageal reflux disease), H/O steroid therapy, Heart palpitations (02/27/8294), Helicobacter pylori ab+, Hemorrhoids, Hiatal hernia, High cholesterol, History of chronic bronchitis, History of migraine, History of MRSA infection, Hyperlipidemia, mixed (08/19/2017), Hyperplastic colon polyp (2007), IBS (irritable bowel syndrome),  Inguinal hernia, Insulin resistance, Interstitial lung disease ([place]), MVP (mitral valve prolapse), Pneumonia, Pneumonitis, hypersensitivity ([place]), PONV (postoperative nausea and vomiting), Pre-diabetes, Pulmonary fibrosis ([place]), PVC (premature ventricular contraction) (08/19/2017), Rapid heart rate, Squamous cell carcinoma of skin, and Tinnitus, right ear.   reports that she has never smoked. She has never used smokeless tobacco.  Past Surgical History:  Procedure Laterality Date  . [place] STUDY N/[name] 02/21/2018   Procedure: [place] STUDY;  Surgeon: Mauri Pole, MD;  Location: [facility];  Service: Endoscopy;  Laterality: N/[name];  . BREAST BIOPSY Right 2009   BIOPSY, pt denies  . BREAST BIOPSY Left 2003   Benign   . CATARACT EXTRACTION Left   . CESAREAN SECTION    . COLONOSCOPY    . ESOPHAGEAL MANOMETRY N/[name] 02/21/2018   Procedure: ESOPHAGEAL MANOMETRY (EM);  Surgeon: Mauri Pole, MD;  Location: [facility];  Service: Endoscopy;  Laterality: N/[name];  . FOOT FRACTURE SURGERY  2006 or 2007   right  . HYMENECTOMY    . LUNG BIOPSY  09/28/2012   Procedure: LUNG BIOPSY;  Surgeon: Melrose Nakayama, MD;  Location: [facility];  Service: Thoracic;  Laterality: N/[name];  lung biopsies tims three  . SQUAMOUS CELL CARCINOMA EXCISION Left    left arm  . TOTAL HIP ARTHROPLASTY Right 09/10/2015   Procedure: RIGHT TOTAL HIP ARTHROPLASTY ANTERIOR APPROACH;  Surgeon: Paralee Cancel, MD;  Location: [facility];  Service: Orthopedics;  Laterality: Right;  . TOTAL HIP ARTHROPLASTY Left 10/03/2019   Procedure: TOTAL HIP ARTHROPLASTY ANTERIOR APPROACH;  Surgeon: Paralee Cancel, MD;  Location: [facility];  Service: Orthopedics;  Laterality: Left;  70 mins  . TUBAL LIGATION    . UPPER GI ENDOSCOPY    . VIDEO ASSISTED THORACOSCOPY  09/28/2012   Procedure: VIDEO ASSISTED THORACOSCOPY;  Surgeon: Melrose Nakayama, MD;  Location: [facility];  Service: Thoracic;  Laterality: Right;  [place] VIDEO BRONCHOSCOPY  11/19/2011    Procedure: VIDEO BRONCHOSCOPY WITH FLUORO;  Surgeon: Brand Males, MD;  Location: [facility];  Service: Endoscopy;;    Allergies  Allergen Reactions  . Atorvastatin Other (See Comments)    Leg pain  . Betadine [Povidone Iodine] Other (See Comments)    blisters  . Codeine Nausea And Vomiting  . Garlic Diarrhea  . Hydrocodone Nausea And Vomiting  . Macrobid [Nitrofurantoin] Other (See Comments)  . Ofev [Nintedanib] Other (See Comments)    Abdominal pain  . Onion Diarrhea  . Other Other (See Comments)  . Rosuvastatin Other (See Comments)    Leg pain  . Shellfish Allergy Nausea And Vomiting  . Sulfa Antibiotics   . Sulfonamide Derivatives Other (See Comments)    headaches    Immunization History  Administered Date(s) Administered  . Fluad Quad(high Dose 65+) 07/17/2019  . Hepatitis [name] 05/23/2010  . Hepatitis B 06/23/2006  . Influenza Split 08/11/2012, 08/14/2017  . Influenza Whole 09/08/2011  . Influenza, High Dose Seasonal PF 08/24/2016, 07/24/2017, 08/31/2018, 08/15/2020, 09/26/2020  . Influenza,inj,Quad PF,6+ Mos 09/07/2013  . Influenza-Unspecified 09/26/2014, 07/27/2015  . MMR 05/23/2010  . PFIZER(Purple Top)SARS-COV-2 Vaccination 12/16/2019, 01/08/2020, 07/18/2020,  09/26/2020  . Pneumococcal Conjugate-13 03/15/2014  . Pneumococcal Polysaccharide-23 01/11/2018, 09/26/2020  . Td 02/22/2003  . Tdap 10/28/2012  . Zoster 12/04/2010  . Zoster Recombinat (Shingrix) 02/15/2017, 05/17/2017    Family History  Problem Relation Age of Onset  . Emphysema Maternal Grandmother   . Asthma Maternal Grandmother   . Asthma Mother   . Osteoarthritis Mother   . Dementia Mother 63  . Lymphoma Father   . Diabetes Father   . Hypertension Sister   . Heart disease Sister   . Kidney disease Sister   . Lung disease Maternal Grandfather   . Bone cancer Paternal Grandfather   . Allergic rhinitis Daughter   . Breast cancer Paternal Aunt   . Ovarian cancer Maternal Aunt   . Breast  cancer Cousin   . Colon cancer Neg Hx   . Angioedema Neg Hx   . Eczema Neg Hx   . Immunodeficiency Neg Hx   . Urticaria Neg Hx      Current Outpatient Medications:  .  acetaminophen (TYLENOL) 500 MG tablet, Tylenol Extra Strength, Disp: , Rfl:  .  albuterol (PROAIR HFA) 108 (90 Base) MCG/ACT inhaler, Inhale 2 puffs into the lungs every 4 (four) hours as needed for wheezing. Or coughing spells.  You may use 2 Puffs 5-10 minutes before exercise., Disp: 1 Inhaler, Rfl: 3 .  Alpha-D-Galactosidase (BEANO PO), Take 1 tablet by mouth as needed (if eating onion or garlic). , Disp: , Rfl:  .  Ascorbic Acid (VITAMIN C) 1000 MG tablet, Take 1,000 mg by mouth daily., Disp: , Rfl:  .  azelastine (ASTELIN) 0.1 % nasal spray, Place 1 spray into both nostrils at bedtime., Disp: , Rfl:  .  CALCIUM CITRATE PO, Take 2 tablets by mouth daily., Disp: , Rfl:  .  chlorpheniramine (CHLOR-TRIMETON) 4 MG tablet, Take 2 mg by mouth 2 (two) times daily. Takes half tablet twice daily, Disp: , Rfl:  .  dicyclomine (BENTYL) 10 MG capsule, Take 10 mg by mouth as needed for spasms., Disp: , Rfl:  .  EPINEPHrine 0.3 mg/0.3 mL IJ SOAJ injection, Inject into the muscle., Disp: , Rfl:  .  ezetimibe (ZETIA) 10 MG tablet, TAKE 1 TABLET (10 MG TOTAL) BY MOUTH DAILY. PLEASE KEEP UPCOMING APPT FOR FUTURE REFILLS. THANK YOU., Disp: 90 tablet, Rfl: 3 .  fluticasone (FLONASE) 50 MCG/ACT nasal spray, SPRAY 2 SPRAYS INTO EACH NOSTRIL EVERY DAY, Disp: 48 mL, Rfl: 1 .  fluticasone furoate-vilanterol (BREO ELLIPTA) 100-25 MCG/INH AEPB, Inhale 1 puff into the lungs daily., Disp: 180 each, Rfl: 3 .  lactase (LACTAID) 3000 units tablet, Take by mouth., Disp: , Rfl:  .  levocetirizine (XYZAL) 5 MG tablet, Take 5 mg by mouth at bedtime., Disp: , Rfl:  .  metFORMIN (GLUCOPHAGE-XR) 500 MG 24 hr tablet, Take 500 mg by mouth daily with supper., Disp: , Rfl:  .  metoprolol succinate (TOPROL-XL) 50 MG 24 hr tablet, Take 1 tablet (50 mg total) by mouth  daily., Disp: 90 tablet, Rfl: 3 .  montelukast (SINGULAIR) 10 MG tablet, TAKE 1 TABLET BY MOUTH EVERYDAY AT BEDTIME, Disp: 90 tablet, Rfl: 2 .  Multiple Vitamin (MULTIVITAMIN) tablet, Take 1 tablet by mouth daily., Disp: , Rfl:  .  nystatin-triamcinolone (MYCOLOG II) cream, nystatin-triamcinolone 100,000 unit/g-0.1 % topical cream, Disp: , Rfl:  .  omeprazole (PRILOSEC) 20 MG capsule, Take 1 capsule (20 mg total) by mouth daily., Disp: 30 capsule, Rfl: 5 .  Pirfenidone (ESBRIET) 267 MG CAPS,  1 tablet TID x 1 wk then 2 tablets TID thereafter, Disp: 180 capsule, Rfl: 3 .  pravastatin (PRAVACHOL) 40 MG tablet, Take 1 tablet (40 mg total) by mouth every evening., Disp: 90 tablet, Rfl: 1 .  predniSONE (DELTASONE) 10 MG tablet, Take 5 mg by mouth daily with breakfast., Disp: , Rfl:  .  Probiotic Product (PROBIOTIC ADVANCED PO), Take 2 capsules by mouth daily. , Disp: , Rfl:  .  Vitamin D, Ergocalciferol, (DRISDOL) 1.25 MG (50000 UNIT) CAPS capsule, Take 50,000 Units by mouth every 7 (seven) days., Disp: , Rfl:  .  nitroGLYCERIN (NITROSTAT) 0.4 MG SL tablet, Place 1 tablet (0.4 mg total) under the tongue every 5 (five) minutes as needed for chest pain., Disp: 25 tablet, Rfl: 12      Objective:   Vitals:   01/14/21 1440  BP: 114/62  Pulse: 80  Temp: (!) 97.2 F (36.2 C)  TempSrc: Oral  SpO2: 94%  Weight: 146 lb 1.6 oz (66.3 kg)  Height: '4\' 9"'  (1.448 m)    Estimated body mass index is 31.62 kg/m as calculated from the following:   Height as of this encounter: '4\' 9"'  (1.448 m).   Weight as of this encounter: 146 lb 1.6 oz (66.3 kg).  '@WEIGHTCHANGE' @  [employer]   01/14/21 1440  Weight: 146 lb 1.6 oz (66.3 kg)     Physical Exam   General: No distress. Looks well Neuro: Alert and Oriented x 3. GCS 15. Speech normal Psych: Pleasant Resp:  Barrel Chest - no.  Wheeze - no, Crackles - yes, No overt respiratory distress CVS: Normal heart sounds. Murmurs - n Ext: Stigmata of Connective  Tissue Disease - no HEENT: Normal upper airway. PEERL +. No post nasal drip        Assessment:       ICD-10-CM   1. 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 oil pain and ? mold in house  J67.9   2. ILD (interstitial lung disease) (HCC)  J84.9 Hepatic function panel    Hepatic function panel  3. Chronic respiratory failure with hypoxia (HCC)  J96.11   4. High risk medication use  Z79.899 Hepatic function panel    Hepatic function panel  5. Medication monitoring encounter  Z51.81 Hepatic function panel    Hepatic function panel  6. Itching  L29.9        Plan:     Patient Instructions     ICD-10-CM   1. 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 oil pain and ? mold in house  J67.9   2. ILD (interstitial lung disease) (HCC)  J84.9 Hepatic function panel  3. Chronic respiratory failure with hypoxia (HCC)  J96.11   4. High risk medication use  Z79.899 Hepatic function panel  5. Medication monitoring encounter  Z51.81 Hepatic function panel  6. Itching  L29.9    Clinically stable but dealing with esbriet side effects of low appetite, fatigue and taste issues  Itching R Flank - unclear and doubt related to esbriet but will be open minded abou tit  Plan Check LFT 01/14/2021 Continue on BREO 1 puff daily , rinse after use .  Continue on Prednisone 57m daily  Continue on Esbriet 2 tabs Three times [name] day  (submaximal but still effective dose)  - we agreed you will soldier on with esbriet but will let uKoreaknow If intoleranb  Continue on Oxygen 2l/m with activity   Activity as tolerated. - ok to swim Take consent  for PULSE study - study looks at "movement is life" Vaseline for skin and give feedback if helps    Followup - LFT check  In 3 months  - 3 months routine visit - 30 min visit with Dr Chase Caller   ( Level 05 visit: Estb 40-54 min in  visit type: on-site physical face to visit  in total care time and counseling or/and coordination of care by this  undersigned MD - Dr Brand Males. This includes one or more of the following on this same day 01/14/2021: pre-charting, chart review, note writing, documentation discussion of test results, diagnostic or treatment recommendations, prognosis, risks and benefits of management options, instructions, education, compliance or risk-factor reduction. It excludes time spent by the Fate or office staff in the care of the patient. Actual time 54 min)   SIGNATURE    Dr. Brand Males, M.D., F.C.C.P,  Pulmonary and Critical Care Medicine Staff Physician, [place] Director - Interstitial Lung Disease  Program  [facility] [place], [place], [address]  Pager: [phone], If no answer or between  15:00h - 7:00h: call [phone] Telephone: [phone]  5:42 PM 01/14/2021

## 2021-01-15 LAB — HEPATIC FUNCTION PANEL
ALT: 12 U/L (ref 0–35)
AST: 19 U/L (ref 0–37)
Albumin: 3.9 g/dL (ref 3.5–5.2)
Alkaline Phosphatase: 68 U/L (ref 39–117)
Bilirubin, Direct: 0.1 mg/dL (ref 0.0–0.3)
Total Bilirubin: 0.3 mg/dL (ref 0.2–1.2)
Total Protein: 7.4 g/dL (ref 6.0–8.3)

## 2021-01-15 NOTE — Telephone Encounter (Signed)
Please advise on patient mychart message  I've decided I do not want to participate in the nitric oxide study. Good luck

## 2021-01-15 NOTE — Telephone Encounter (Signed)
Thank you and the decision is to be respected.  Appreciate the feedback.  We will make a note.  We will close message

## 2021-01-17 NOTE — Progress Notes (Signed)
LFT Normal but she texted me today she stoped esbriet due to affecting qualty of life and she feels better.

## 2021-01-21 ENCOUNTER — Encounter: Payer: Self-pay | Admitting: *Deleted

## 2021-01-21 DIAGNOSIS — J84112 Idiopathic pulmonary fibrosis: Secondary | ICD-10-CM | POA: Diagnosis not present

## 2021-01-21 DIAGNOSIS — D849 Immunodeficiency, unspecified: Secondary | ICD-10-CM | POA: Diagnosis not present

## 2021-02-03 ENCOUNTER — Other Ambulatory Visit: Payer: Self-pay | Admitting: Family Medicine

## 2021-02-03 DIAGNOSIS — Z1231 Encounter for screening mammogram for malignant neoplasm of breast: Secondary | ICD-10-CM

## 2021-03-24 NOTE — Telephone Encounter (Signed)
Dr. Chase Caller please advise on the following My Chart message:  [facility] [place] has asked we clarify my prednisone needs. They have been filling an old prescription that has large doses and step down. I actually need a fresh prescription for 10 mg daily . 3 month supply please. Thanks.   Per your last OV note you said continue Prednisone 5mg  daily. Is this the correct does? May we send a refill in for this?  Thank you

## 2021-03-27 ENCOUNTER — Ambulatory Visit: Payer: Medicare Other

## 2021-03-28 MED ORDER — PREDNISONE 10 MG PO TABS: 10.0000 mg | ORAL_TABLET | Freq: Every day | ORAL | 0 refills | Status: DC

## 2021-03-28 NOTE — Telephone Encounter (Signed)
If patient is currently on 10mg  per day (might have titated up by self) then please do that. You can double check with her about the discrepancey between her request (10mg  per day) and my instructions (5mg  per day)

## 2021-03-28 NOTE — Telephone Encounter (Signed)
RX sent for patient.

## 2021-04-03 ENCOUNTER — Other Ambulatory Visit: Payer: Self-pay

## 2021-04-03 ENCOUNTER — Ambulatory Visit
Admission: RE | Admit: 2021-04-03 | Discharge: 2021-04-03 | Disposition: A | Discharge status: Home / Self Care | Payer: Medicare Other | Source: Ambulatory Visit | Attending: Family Medicine | Admitting: Family Medicine

## 2021-04-03 DIAGNOSIS — Z1231 Encounter for screening mammogram for malignant neoplasm of breast: Secondary | ICD-10-CM | POA: Diagnosis not present

## 2021-04-04 ENCOUNTER — Other Ambulatory Visit: Payer: Self-pay | Admitting: Family Medicine

## 2021-04-04 DIAGNOSIS — R928 Other abnormal and inconclusive findings on diagnostic imaging of breast: Secondary | ICD-10-CM

## 2021-04-14 DIAGNOSIS — J3081 Allergic rhinitis due to animal (cat) (dog) hair and dander: Secondary | ICD-10-CM | POA: Diagnosis not present

## 2021-04-14 DIAGNOSIS — J4540 Moderate persistent asthma, uncomplicated: Secondary | ICD-10-CM | POA: Diagnosis not present

## 2021-04-14 DIAGNOSIS — J3089 Other allergic rhinitis: Secondary | ICD-10-CM | POA: Diagnosis not present

## 2021-04-14 DIAGNOSIS — J301 Allergic rhinitis due to pollen: Secondary | ICD-10-CM | POA: Diagnosis not present

## 2021-04-16 DIAGNOSIS — H35372 Puckering of macula, left eye: Secondary | ICD-10-CM | POA: Diagnosis not present

## 2021-04-16 DIAGNOSIS — E119 Type 2 diabetes mellitus without complications: Secondary | ICD-10-CM | POA: Diagnosis not present

## 2021-04-16 DIAGNOSIS — H524 Presbyopia: Secondary | ICD-10-CM | POA: Diagnosis not present

## 2021-04-16 DIAGNOSIS — H26492 Other secondary cataract, left eye: Secondary | ICD-10-CM | POA: Diagnosis not present

## 2021-04-22 DIAGNOSIS — U071 COVID-19: Secondary | ICD-10-CM | POA: Diagnosis not present

## 2021-04-23 ENCOUNTER — Ambulatory Visit: Payer: Medicare Other

## 2021-04-23 ENCOUNTER — Ambulatory Visit
Admission: RE | Admit: 2021-04-23 | Discharge: 2021-04-23 | Disposition: A | Discharge status: Home / Self Care | Payer: Medicare Other | Source: Ambulatory Visit | Attending: Family Medicine | Admitting: Family Medicine

## 2021-04-23 ENCOUNTER — Other Ambulatory Visit: Payer: Self-pay

## 2021-04-23 DIAGNOSIS — R922 Inconclusive mammogram: Secondary | ICD-10-CM | POA: Diagnosis not present

## 2021-04-23 DIAGNOSIS — R928 Other abnormal and inconclusive findings on diagnostic imaging of breast: Secondary | ICD-10-CM

## 2021-04-23 DIAGNOSIS — R921 Mammographic calcification found on diagnostic imaging of breast: Secondary | ICD-10-CM | POA: Diagnosis not present

## 2021-04-28 DIAGNOSIS — E559 Vitamin D deficiency, unspecified: Secondary | ICD-10-CM | POA: Diagnosis not present

## 2021-04-28 DIAGNOSIS — E119 Type 2 diabetes mellitus without complications: Secondary | ICD-10-CM | POA: Diagnosis not present

## 2021-04-28 DIAGNOSIS — I471 Supraventricular tachycardia: Secondary | ICD-10-CM | POA: Diagnosis not present

## 2021-04-28 DIAGNOSIS — E785 Hyperlipidemia, unspecified: Secondary | ICD-10-CM | POA: Diagnosis not present

## 2021-04-28 DIAGNOSIS — Z0001 Encounter for general adult medical examination with abnormal findings: Secondary | ICD-10-CM | POA: Diagnosis not present

## 2021-04-28 DIAGNOSIS — J309 Allergic rhinitis, unspecified: Secondary | ICD-10-CM | POA: Diagnosis not present

## 2021-04-28 DIAGNOSIS — R7301 Impaired fasting glucose: Secondary | ICD-10-CM | POA: Diagnosis not present

## 2021-04-28 DIAGNOSIS — J8410 Pulmonary fibrosis, unspecified: Secondary | ICD-10-CM | POA: Diagnosis not present

## 2021-05-01 DIAGNOSIS — Z96641 Presence of right artificial hip joint: Secondary | ICD-10-CM | POA: Diagnosis not present

## 2021-05-01 DIAGNOSIS — T8484XA Pain due to internal orthopedic prosthetic devices, implants and grafts, initial encounter: Secondary | ICD-10-CM | POA: Diagnosis not present

## 2021-05-01 DIAGNOSIS — M25561 Pain in right knee: Secondary | ICD-10-CM | POA: Diagnosis not present

## 2021-05-01 DIAGNOSIS — M2241 Chondromalacia patellae, right knee: Secondary | ICD-10-CM | POA: Diagnosis not present

## 2021-05-01 DIAGNOSIS — S82891S Other fracture of right lower leg, sequela: Secondary | ICD-10-CM | POA: Diagnosis not present

## 2021-05-07 DIAGNOSIS — Z23 Encounter for immunization: Secondary | ICD-10-CM | POA: Diagnosis not present

## 2021-06-02 NOTE — Telephone Encounter (Signed)
Please advise on patient mychart message    I have had severe laryngitis for over  2 months. Rounds of antibiotics and prednisone from my family doctor has not touched it. Can you refer me to an ENT specializes in voice? . I'm sure the chronic coughing has caused some long term Problems.

## 2021-06-17 ENCOUNTER — Telehealth: Payer: Self-pay | Admitting: Internal Medicine

## 2021-06-17 DIAGNOSIS — J209 Acute bronchitis, unspecified: Secondary | ICD-10-CM

## 2021-06-17 NOTE — Telephone Encounter (Signed)
Pt is requesting a referral be sent to Dr. Lind Guest for ENT. Pls regard; [phone]

## 2021-06-17 NOTE — Telephone Encounter (Signed)
Yes in my chart msg earlier 06/17/2021 - I informed about making referral to Dr Blenda Nicely and you can make it now

## 2021-06-17 NOTE — Telephone Encounter (Signed)
She can see Dr Blenda Nicely here at [facility] ([facility] of [facility]) or go to [place] to see Dr Ernestine Conrad

## 2021-06-17 NOTE — Telephone Encounter (Signed)
Called and spoke with pt and she stated that her last visit she spoke with MR about seeing the ENT.  She stated that this has been going on since the first week in May.  She stated that she did have a mold exposure early May.   She stated that she has tried abx, pred, and voice rest.  She stated that she is not any better and still has the occasional cough. She stated that something is going on and she will need that referral placed to see Dr. Lind Guest.  MR please advise. Thanks

## 2021-06-17 NOTE — Telephone Encounter (Signed)
Pt provided with ENT doctor recommendations from Dr. Chase Caller via My Chart message. Nothing further needed at this time.

## 2021-06-18 NOTE — Telephone Encounter (Signed)
Spoke with Rhonda [name] and notifed her Dr. Jama Flavors referral would be placed to. Rhonda [name] stated understanding. Nothing further needed at this time.

## 2021-06-30 ENCOUNTER — Ambulatory Visit: Payer: Medicare Other | Admitting: Family Medicine

## 2021-07-04 DIAGNOSIS — R198 Other specified symptoms and signs involving the digestive system and abdomen: Secondary | ICD-10-CM | POA: Diagnosis not present

## 2021-07-04 DIAGNOSIS — J8410 Pulmonary fibrosis, unspecified: Secondary | ICD-10-CM | POA: Diagnosis not present

## 2021-07-04 DIAGNOSIS — Z9981 Dependence on supplemental oxygen: Secondary | ICD-10-CM | POA: Diagnosis not present

## 2021-07-04 DIAGNOSIS — J383 Other diseases of vocal cords: Secondary | ICD-10-CM | POA: Diagnosis not present

## 2021-07-07 ENCOUNTER — Telehealth: Payer: Self-pay | Admitting: Internal Medicine

## 2021-07-07 MED ORDER — AZITHROMYCIN 250 MG PO TABS: ORAL_TABLET | ORAL | 0 refills | Status: DC

## 2021-07-07 MED ORDER — PREDNISONE 10 MG PO TABS: ORAL_TABLET | ORAL | 0 refills | Status: DC

## 2021-07-07 NOTE — Telephone Encounter (Signed)
Called and spoke to pt. Informed her of the recs per MR. Pt states she would try zpak, scripts sent to preferred pharmacy. Appt has been scheduled with MR tomorrow 8/16 at 1330. Pt states she will take a covid test tomorrow morning before appt.    Will forward to MR as FYI.

## 2021-07-07 NOTE — Telephone Encounter (Signed)
Called and spoke with patient. She stated that she had her first consult with Dr. Blenda Nicely, ENT on 07/04/21. She placed the patient on Gabapentin '100mg'$  TID. She has not titrated herself up to the full '300mg'$  dose daily, she is still at the '200mg'$  dose. She was reading the side effects and it the insert said if the patient had breathing concerns, they should not be on Gabapentin.   While on the phone, she also stated that the cough has gotten worse. The phlegm has always been thick, white but this morning it has a yellowish tint to it. Her SOB and wheezing increases during her coughing spells.   She told two covid tests, one on 07/03/21 and one yesterday. They were both negative.   She wants to know MR's opinion on the Gabapentin and the color change of her phlegm.   [facility] [address]   MR, can you please advise? Thanks!

## 2021-07-07 NOTE — Telephone Encounter (Signed)
Okay to titrate up her gabapentin as advised by ENT.  It will not mess up the respiratory system.  Typically it causes drowsiness grogginess, fogginess.  Titrating slowly up will help prevent that.  If the side effects happen we can always stop the drug.  The only time respiratory depression happens is when this renal failure and the dose is extremely high.  I personally not seen it at the current recommended dose for her  Regarding her yellow phlegm -check COVID antigen again tomorrow meanwhile she also told me by text message today that her oxygen levels are lower than usual  Plan - Take doxycycline '100mg'$  po twice daily x 5 days; take after meals and avoid sunlight   OR X Z PAK   - Please take Take prednisone '40mg'$  once daily x 3 days, then '30mg'$  once daily x 3 days, then '20mg'$  once daily x 3 days, then prednisone '10mg'$  once daily  x 3 days and got baselie dose   - Please see if you can work her in this week to see me [coordinate with Ashley Akin and or Raquel Sarna Pinion for this].  I do not mind coming in early     Allergies  Allergen Reactions   Atorvastatin Other (See Comments)    Leg pain   Betadine [Povidone Iodine] Other (See Comments)    blisters   Codeine Nausea And Vomiting   Garlic Diarrhea   Hydrocodone Nausea And Vomiting   Macrobid [Nitrofurantoin] Other (See Comments)   Ofev [Nintedanib] Other (See Comments)    Abdominal pain   Onion Diarrhea   Other Other (See Comments)   Rosuvastatin Other (See Comments)    Leg pain   Shellfish Allergy Nausea And Vomiting   Sulfa Antibiotics    Sulfonamide Derivatives Other (See Comments)    headaches

## 2021-07-08 ENCOUNTER — Encounter: Payer: Medicare Other | Admitting: *Deleted

## 2021-07-08 ENCOUNTER — Other Ambulatory Visit: Payer: Self-pay

## 2021-07-08 ENCOUNTER — Telehealth: Payer: Self-pay | Admitting: Internal Medicine

## 2021-07-08 ENCOUNTER — Encounter: Payer: Self-pay | Admitting: Internal Medicine

## 2021-07-08 ENCOUNTER — Ambulatory Visit (INDEPENDENT_AMBULATORY_CARE_PROVIDER_SITE_OTHER): Payer: Medicare Other

## 2021-07-08 ENCOUNTER — Ambulatory Visit (INDEPENDENT_AMBULATORY_CARE_PROVIDER_SITE_OTHER): Payer: Medicare Other | Admitting: Internal Medicine

## 2021-07-08 VITALS — BP 122/70 | HR 74 | Temp 97.9°F | Ht <= 58 in | Wt 147.8 lb

## 2021-07-08 DIAGNOSIS — J9611 Chronic respiratory failure with hypoxia: Secondary | ICD-10-CM | POA: Diagnosis not present

## 2021-07-08 DIAGNOSIS — J209 Acute bronchitis, unspecified: Secondary | ICD-10-CM

## 2021-07-08 DIAGNOSIS — R058 Other specified cough: Secondary | ICD-10-CM | POA: Diagnosis not present

## 2021-07-08 DIAGNOSIS — R490 Dysphonia: Secondary | ICD-10-CM

## 2021-07-08 DIAGNOSIS — R0602 Shortness of breath: Secondary | ICD-10-CM | POA: Diagnosis not present

## 2021-07-08 DIAGNOSIS — J849 Interstitial pulmonary disease, unspecified: Secondary | ICD-10-CM | POA: Diagnosis not present

## 2021-07-08 DIAGNOSIS — R059 Cough, unspecified: Secondary | ICD-10-CM | POA: Diagnosis not present

## 2021-07-08 DIAGNOSIS — J679 Hypersensitivity pneumonitis due to unspecified organic dust: Secondary | ICD-10-CM

## 2021-07-08 DIAGNOSIS — Z006 Encounter for examination for normal comparison and control in clinical research program: Secondary | ICD-10-CM

## 2021-07-08 MED ORDER — SODIUM CHLORIDE 7 % IN NEBU: INHALATION_SOLUTION | RESPIRATORY_TRACT | 3 refills | Status: AC

## 2021-07-08 MED ORDER — SODIUM CHLORIDE 3 % IN NEBU: INHALATION_SOLUTION | RESPIRATORY_TRACT | 12 refills | Status: DC

## 2021-07-08 MED ORDER — PREDNISONE 10 MG PO TABS: ORAL_TABLET | ORAL | 0 refills | Status: AC

## 2021-07-08 NOTE — Research (Signed)
Title: Chronic Fibrosing Interstitial Lung Disease with Progressive Phenotype Prospective Outcomes (ILD-PRO) Registry   Protocol #: IPF-PRO-SUB, Clinical Trials # [id number], Sponsor: [employer]  Protocol Amendment 4 (Version Date: 04 August 2018)   and confirmed current on 07/08/2021 Consent Version for today's visit date of 07/08/2021  is [facility] Approved Version 27 Oct 2018 Revised 27 Oct 2018  Objectives:  Describe current approaches to diagnosis and treatment of chronic fibrosing ILDs with progressive phenotype  Describe the natural history of chronic fibrosing ILDs with progressive phenotype  Assess quality of life from self-administered participant reported questionnaires for each disease group  Describe participant interactions with the healthcare system, describe treatment practices across multiple institutions for each disease group  Collect biological samples linked to well characterized chronic fibrosing ILDs with progressive phenotype to identify disease biomarkers  Collect data and biological samples that will support future research studies.                                            Key Inclusion Criteria: Willing and able to provide informed consent  Age ? 30 years  Diagnosis of a non-IPF ILD of any duration, including, but not limited to Idiopathic Non-Specific Interstitial Pneumonia (INSIP), Unclassifiable Idiopathic Interstitial Pneumonias (IIPs), Interstitial Pneumonia with Autoimmune Features (IPAF), Autoimmune ILDs such as Rheumatoid Arthritis (RA-ILD) and Systemic Sclerosis (SSC-ILD), Chronic Hypersensitivity Pneumonitis (HP), Sarcoidosis or Exposure-related ILDs such as asbestosis.  Chronic fibrosing ILD defined by reticular abnormality with traction bronchiectasis with or without honeycombing confirmed by chest HRCT scan and/or lung biopsy.  Progressive phenotype as defined by fulfilling at least one of the criteria below of fibrotic  changes (progression set point) within the last 24 months regardless of treatment considered appropriate in individual ILDs:  decline in FVC % predicted (% pred) based on >10% relative decline  decline in FVC % pred based on ? 5 - <10% relative decline in FVC combined with worsening of respiratory symptoms as assessed by the site investigator  decline in FVC % pred based on ? 5 - <10% relative decline in FVC combined with increasing extent of fibrotic changes on chest imaging (HRCT scan) as assessed by the site investigator  decline in DLCO % pred based on ? 10% relative decline  worsening of respiratory symptoms as well as increasing extent of fibrotic changes on chest imaging (HRCT scan) as assessed by the site investigator independent of FVC change.    Key Exclusion Criteria: Malignancy, treated or untreated, other than skin or early stage prostate cancer, within the past 5 years  Currently listed for lung transplantation at the time of enrollment  Currently enrolled in a clinical trial at the time of enrollment in this registry     [facility] [place] Coordinator note :   This visit for Subject Rhonda [name] with DOB: 1951-09-06 on 07/08/2021 for the above protocol is Visit/Encounter # 6 and is for purpose of research.   Subject expressed continued interest and consent in continuing as a study subject. Subject confirmed that there was no change in contact information (e.g. address, telephone, email). Subject thanked for participation in research and contribution to science.   The Subject was informed that the PI Dr. Chase Caller continues to have oversight of the subject's visits and course  through relevant discussions, reviews and also specifically of this visit by routing of this  note to the PI.  During this visit on 07/08/2021, the subject completed the blood work and questionnaires per the above referenced protocol.  Please refer to the subject's paper source binder  for further details.    Signed by  Lazaro Arms Clinical Research Coordinator  [facility]  [place], [place] 3:22 PM 07/08/2021

## 2021-07-08 NOTE — Addendum Note (Signed)
Addended by: Lorretta Harp on: 07/08/2021 02:40 PM   Modules accepted: Orders

## 2021-07-08 NOTE — Addendum Note (Signed)
Addended by: Suzzanne Cloud E on: 07/08/2021 03:15 PM   Modules accepted: Orders

## 2021-07-08 NOTE — Telephone Encounter (Signed)
We always prescribed 3% hypertonic saline because 7% hypertonic saline was never available.  It is interesting that this time the same 7% hypertonic saline is available.  It can certainly make a cough of feel weird but it is definitely worth a try to do 3 mL 7% hypertonic saline to see if it can clear up the mucus.  She can start with once a day initially

## 2021-07-08 NOTE — Addendum Note (Signed)
Addended by: Lorretta Harp on: 07/08/2021 02:32 PM   Modules accepted: Orders

## 2021-07-08 NOTE — Addendum Note (Signed)
Addended by: Lorretta Harp on: 07/08/2021 03:02 PM   Modules accepted: Orders

## 2021-07-08 NOTE — Telephone Encounter (Signed)
MR please advise if the hypertonic solution 7% is ok to use since the hypertonic 3% is on back order per the pharmacy.  Thanks

## 2021-07-08 NOTE — Progress Notes (Signed)
#GE reflux with small hiatal hernia  - on ppi   #History of rapid heart rate not otherwise specified  - Start her on Lopressor  2008/2009 by primary care physician. History appears to correlate with onset of respiratory issues  - refuses to dc this drug as of 2014 discussion   # Hypersensitivity Pneumonitis and ILD  - Potential etiologies - cockateil x 2 for 18 years through end 2012. 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 Denies mold but lives in house built in [name] with a "weird baselment: and has humidifier  - first noted on CT chest 10/15/09 following trip to [name] (PE negative) but not described in 2003 CT chest report  - autoimmune panel: 10/23/11: Negative  -  Uderwennt bronch 11/19/11  - non diagnostic  - VATS Nov 2013 (done after initially refusing)- [name]. However, there is worrying trend of UIP pattern in the Upper lobes.            Oct/Nov 2012  March 2013 08/11/12  Nov/Dec 2013 Dx HP/UIP 01/02/2013  02/28/13 05/25/2013  12/06/2013  03/15/2014  06/29/2014  10'27/15 12/19/2014  02/26/15 01/13/2016  04/29/2016  10/23/2016  01/25/2017  06/17/2017 [name] July 2018 01/11/2018  04/13/2018  08/16/2018   Symp/Signs Dyspnea x5y  New crackles  dimished dyspnea except at hill. Improved crackles.  Improved dyspnea except @ hill                 FVC  2.6 L    2.2 L/82%   2.1 L/80%   2.19 L/82%  2.2L/91% 2.1/81% 1.86/69% 1.9L/71% 1.86/75% 1.75L/65% 1.82/73% (dec 2015 offce spiro) 2.84L/72% 1.54/61% 1.78L/67%  1.69L/65%  1.61/62% 1.35/55% 1.65/67%  DLCO  10/50%    10/51%   9.6/51%     11.9/63% 11.7/62%  11.561%  5.5/31% 10.06/57% 11/04/63%  10.43/55% 8.56/50% 10.57/61% 10.16/57% 9.21/52%  Walk test 185 ft x 3 laps on RA  rest 92%. Pk exertion 90%. Pk HR 108 Rest 92%. Pk exertion 86% at 2.5 laps. Pk HR 102  pk exertion - 88% Rest 96%. Pk 95%. Pk HR is 75. Done at full dose  lopressor Rest 94%. Pk 91%. Rest HR 93 with pk HR 103/min. Done at half dose lopressor REst 96%. Pk pulse ox 92%. Rest HR 65. Pk HR 86 Did not desat. Normal Pk HR  Pulse ox 96%, droped to89% at 2nd lap but bounced up and ended at 91% 3rd lap Did not desat Did not desat, Pk HHR 93  ;llowest pulse ox 90% at 2nd lap but she held her breath, then 92% at 3rd lap 99% -> 91% at end of 3rd lap 94%, Dropped to 87% at 3rd lap, HR peak102/m 97%, dropped to 89% in 3rd lap - asymptomatic. HR 100/min No prob with 41md at [name] 99% rest - -> dopped to 92% 3rd lap. Pkr HR 107/min 98% rest -> 91% 3rd lap with Pk HR 91/min   CT chest   unchanged          yes Ct mid dec possibly worse - unsure Ct - similar to April 2016 but worse since 2013   CT 06/24/17 - similar to June 2017     Tests/Bx Bronch 0- non diagnostic   VATS - HP with UIP in Upper lobe  RX    Rx pred start 11/23/12 On  pred 56m per day and will reduce to 246mOn Pred 2076mer day On pred 21m32mr day On pred 5mg 36m day Rx pred 5mg d86my She reduced to pred 5mg x 83mt 1 month QOD due to GI concern side effects On pred 5mg qod88mt will increase to 5/d after this visit On pred 5mg dail21m On pred 5mg daily21mncrease pred to 21mg per d65meduce to pred 7.5 due to skin sissues  pred burs and reduce to 21mg per da27m                         OV 06/17/2017  Chief Complaint  Patient presents with   Follow-up    [name]. Pt denies significant cough, denies CP/tightness, f/c/s.     She is better. Feels she does not need o2. Has been to [name] for transplant clinic; felt too early. Seems higher dose prednisone helping but then she is also bettter t his time of the year. In May 2018 I was at [facility] and curbsided national thought leaders - feel that we could explore silent active GERD as a possibility of ILD getting wors with time.   OV 01/11/2018  Chief Complaint  Patient presents with   Follow-up    PFT done today.  Pt  states she has been coughing x2 weeks since she returned from a trip to [place]. FroDelawareing, pt has had pain in ribs. Pt states she has mild SOB which is stable due to rehab.  Pt states that Dr. Richter is wDarron Doomto know if pt could possibly have eosinophilic esophagitis.   Went to [place]. RetDelawarend has been coughing x 2 weeks. Associated with this is some rib pain on right side. She feels she does not need o2. She feels she does not need pred/abx at this time. She also exposed to flu wit family   OV 02/08/2018  Chief Complaint  Patient presents with   Acute Visit    CXR done 02/07/18.  Pt states her throat is sore but not as bad as it was yesterday when in office and does have some left ear pain. Pt states she has some pain on right rib cage and has increased fatigue.     [name] preBodinutely.  I last saw her one month ago gave her Tamiflu for prophylaxis after flu exposure.  At that point in time she was already reducing her chronic stable prednisone of 10 mg for interstitial lung disease/hypersensitivity pneumonitis.  She had tapered herself slowly 3 or 4 weeks ago to 5 mg.  She says she was doing fairly okay but in the last 2 or 3 weeks she has had increased fatigue.  She says she goes daily swimming and then feels energized but then an hour later starts having fatigue which is more than usual.  Also in the daytime she has random fatigue.  In terms of her respiratory symptoms these are stable and in fact slightly better in terms of shortness of breath and cough but she is having sore throat without any fever and some left-sided otalgia.  In addition she is having right-sided chest pains that are more than usual.  The chest pains have been reported before.  These are specifically at localized spots along the previous incision several years ago for interstitial lung disease.  They  are tender as well to touch.  This pain is worse.  In addition she is also being bothered by arthralgia in  her hand and left hip.  The left hip arthralgias old with a hand arthralgias new.  She is worried about pneumothorax recurrence and so she had a chest x-ray yesterday that shows no pneumothorax.  She has stable interstitial lung disease changes but in my personal visualization these interstitial lung disease changes are worse compared to several years ago.  We do know that she has slowly worsening interstitial lung disease.  In terms of a walking desaturation test this shows stability since last visit.  She has seen GI for acid reflux and has pH probe study coming up  In talking to her she tells me she has been on metformin for over a month or 2.  This is new and is meant for pre-diabetes.  Apparently her hemoglobin A1c is always below 6 but is above 5.5.  Walking desaturation test on 02/08/2018 185 feet x 3 laps on ROOM AIR:  did walk normal pace with forehead probe desaturate. Rest pulse ox was 99%, final pulse ox was 93%. HR response 72/min at rest to 93/min at peak exertion. [name]  Did not Desaturate < 88% . [name] yes did  Desaturated </= 3% points. [name] yes did get tachyardic   Dg Chest 2 View  Result Date: 02/07/2018 CLINICAL DATA:  Chest wall pain for 2 weeks EXAM: CHEST - 2 VIEW COMPARISON:  CT 08/20/2017, radiograph 10/11/2015 FINDINGS: Coarse interstitial pattern consistent with pulmonary fibrosis, similar distribution compared to prior. No focal pulmonary opacity or pleural effusion. Stable cardiomediastinal silhouette with aortic atherosclerosis. No pneumothorax. Degenerative changes of the spine. IMPRESSION: No active cardiopulmonary disease. Similar appearance of diffuse pulmonary fibrosis. Negative for a pneumothorax. Electronically Signed   By: Donavan Foil M.D.   On: 02/07/2018 14:26    OV 04/13/2018  Chief Complaint  Patient presents with   Follow-up    Pt had pre spiro and DLCO PFT prior to OV. Pt has increase of productive cough-thick  white in last week. Pt has some wheezing, and allergy issues.   Ms. Freda Munro presents for routine follow-up.  I saw her acutely just approximately 2 months ago.  Then end of April 2019 she saw a nurse practitioner again acutely and was given antibiotic and prednisone.  She says she felt better after that but in the last week or so due to increased pollen load in the community and also moving furniture because her daughter is relocating out of her home she started having more cough.  There is no change in her dyspnea with the cough is really bad.  This is despite Singulair and Chlor-Trimeton.  Those things to help but just take the edge off.  She feels that the pollen making the cough worse.  There is no change in dyspnea.  Walking desaturation test documented below his baseline.  She had pulmonary function test today but the [facility] shows significant decline compared to February 2019 and she feels surprised by this.  DLCO is baseline unchanged and a walking desaturation test limited in the office is also unchanged.  She does not have any fever but has significant postnasal drip.  She has not followed up at [facility] transplant clinic or the allergist recently.  OV 08/16/2018  Subjective:  Patient ID: [name], female , DOB: 02/04/1951 , age 51 y.o. , MRN: [id number] , ADDRESS:  [address] [address]   08/16/2018 -   Chief Complaint  Patient presents with   Follow-up    PFT performed today.  Pt states she has had a lot of mucus the month of September 2019 so she has been taking the Chlor-Trimeton. States other than that she has been able to do a lot more activities and has been swimming a lot more. Pt states she believes the SOB is stable.     HPI [name] 70 y.o. -continues to do well.  She is taking higher dose of prednisone 10 mg/day.  Also the summer usually she is doing well.  She is not having much of a cough.  Lung function test shows improvement compared to  tests done earlier this year.  In fact she is almost as good as much 2018.  Still compared to 2014-2016 she has progressive lung disease.  Walking desaturation test is stable.  She needs a high-dose flu shot but we do not have stock today.  She has lost some weight intentionally with better dietary control.  She is exercising regularly swimming.  We discussed ofev  potential future therapy if the drug is approved for this indication.  We will know study results in a year or so.  She is open to the idea but is worried somewhat about her irritable bowel syndrome flaring up.  She is no longer following at the [facility] lung transplant clinic given stability lung function  Other issues: She always has right-sided chest wall pain to the site of lung biopsy.  The only way this is been resolved as by her limiting her use of bra .  Also her irritable bowel syndrome is under control but in case it flares up she wants a refill of dicyclomine     OV 12/15/2018  Subjective:  Patient ID: [name], female , DOB: 11-13-1951 , age 34 y.o. , MRN: [id number] , AD[address]ESS: [address]  [id number]   12/15/2018 -   Chief Complaint  Patient presents with   Follow-up    Pt states she has been doing better since last visit with TP but states she has been having pain in her rib cage x2 years but states it has become more intense.     HPI [name] 70 y.o. -returns for follow-up of her ILD due to hypersensitivity pneumonitis.  She did a clinic visit also for research with the ILD-pro registry study.  Since I last saw her in September 2019 she had a flareup and required higher doses of prednisone.  Since then she is returned to baseline of 5 mg prednisone and she feels good.  She is stable.  Walking desaturation test shows stability.  We have been discussing over the last few months about taking nintedanib based on new data and progressive non--IPF ILD's.  She has trepidation for this because  of irritable bowel syndrome.  After much discussion she is willing to try 100 mg once daily for a month and then escalate to 100 mg twice daily which is the therapeutic dose.  She will do this once after insurance approval which we suspect will happen over the next few months.  Her main issue is that her right sided infra-axillary chest pain is getting worse.  This started after her surgical lung biopsy and pneumothorax on the right side.  It is random and happens with twisting.  But now days it happens once every few days.  Previously  it used to happen once every few weeks.  Sometimes it is excruciating but then she is left in pain for a few days.  Applying Abrol sudden twisting of her body makes it worse.  Is a lancinating pain.  She did try gabapentin some years ago for chronic cough .  She did tolerate the gabapentin well.  She does not remember if it helped the pain or not but clearly back and the pain was much less intense.  She is willing to try this again.  I recommended a CT scan of the chest but she is going to have radiation for a CT coronary angiogram because of high levels of coronary artery calcification.  Therefore written to the radiologist to see if he can look at the lung parenchyma with a CT angiogram    OV 05/09/2019  Subjective:  Patient ID: [name], female , DOB: 08/23/1951 , age 81 y.o. , MRN: [id number] , AD[address]ESS: [address] [place] [address]   05/09/2019 -   Chief Complaint  Patient presents with   ILD (Interstitial Lung Disease)     HPI [name] 70 y.o. -follow-up for chronic care physician pneumonitis on daily 5 mg prednisone.  She has problems with right-sided chest wall pain that is neuropathic.  She tells me since her last visit January 2020 she has been isolating and social distancing because of the pandemic.  Overall she is been stable.  Her symptom scores are mild and documented below.  She continues with prednisone.  She was supposed to  have pulmonary function test but this was held because of the pandemic.  Her walking desaturation test shows she is stable.  She is supposed to have pulmonary function test tomorrow but wanted to see me today.  In the interim she did have a cardiac CT scan of the chest that shows 94th percentile of coronary calcium.  She could not get a CT angiogram done because of PVC.  A nuclear medicine stress test was done and I reviewed this result and it was normal.  A Holter test is pending.  She is kind of nervous because of the ongoing COVID-19 situation and safety of doing a Holter test.  In terms of her right chest wall pain she is adapted to it.  She does not use any bra  OV 08/22/2019  Subjective:  Patient ID: [name], female , DOB: [birth year] , age 65 y.o. , MRN: [id number] , ADDRESS: [address] [place] [address]   08/22/2019 -   Chief Complaint  Patient presents with   Follow-up    needs sx clearance for L hip replacement- not yet scheduled, pending clearance.     Patient lung disease chronic hypersensitive pneumonitis on chronic daily prednisone 5 mg/day  HPI [name] 70 y.o. -presents for follow-up of her interstitial lung disease due to chronic hypersensitive pneumonitis.  After last visit in June 2020 given her PFT stability and also her aversion to the potential side effects with nintedanib she decided to just continue with prednisone daily 5 mg/day.  Given the pandemic she is walking 1 mile a day without stopping.  Her interstitial lung disease symptom score is actually slightly better.  Overall she reports stability in her interstitial lung disease.  She has a new issue of getting preoperative clearance for her left hip.  She says the left hip is bone-on-bone and she does not want to take Aleve.  She prefers to have surgery.  Dr. Paralee Cancel is the surgeon.  Surgery date has not been set.  She is very functional.  She has normal renal function.  Normal nutritional  status.  No anemia.  No respiratory infections in the last 1 month.  She is not on oxygen.  Age 77.  The surgeries in the left hip and the duration of surgery is under 3 hours.  Anesthesia will be general.      OV 01/30/2020  Subjective:  Patient ID: [name], female , DOB: [birth year] , age 53 y.o. , MRN: [id number] , AD[address]ESS: [address] [address] Patient lung disease chronic hypersensitive pneumonitis on chronic daily prednisone 5 mg/day  01/30/2020 -   Chief Complaint  Patient presents with   Follow-up    PFT performed 3/8.  Pt states she believes she has been going downhill for the past 2 months. Pt was started on O2 at last OV and wears it prn. Pt does have an occ cough.     HPI [name] 70 y.o. -presents for follow-up of interstitial lung disease secondary to chronic hypersensitive pneumonitis.  She maintains herself on prednisone 5 mg/day.  In the interim she has had hip surgery and this did wonders for her.  She is able to walk longer distances.  However she does notice that her dyspnea has declined.  She says since January 2021 his symptoms have declined particularly with cough.  Usually in the winter she goes through a cycle of severe cough and exacerbation that requires higher levels of prednisone.  She has been social distancing well and masking well.  She states there is absolutely no mold or water any antigen exposure at home.  But she feels it is her allergies acting up and making her more symptomatic.  She has had a Covid vaccine and is wondering about either going back to swimming or starting pulmonary rehabilitation.  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  In fact when we walked her today with the mask she did drop down to 89%.  This is a decline for her.  She had pulmonary function test and this shows significant decline in FVC and DLCO.  In review of this  that.  Where she has declined this much but is always bounce back with steroids.  At last visit we discussed nintedanib for her as a way to prevent progression of her ILD but given her concerns of GI side effects and the presence of irritable bowel syndrome she is declined.  Infective have this conversation with her including her joining the [employer] at [facility] but she has always been worried about the GI side effects with nintedanib.  This time she is more open to it because of the decline in lung function.  She wants to see an allergist  She also told me that she continues to have a restrictive chest pain particularly on the right side lower area.  She no longer wears a brassiere it has been nearly 3 years since she had a high-resolution CT chest.  She is open to having one now.  Last liver function test was normal in January 2020.  Last renal function was normal in November 2020.      OV 03/19/2020  Subjective:  Patient ID: [name], female , DOB: 08-Feb-1951 , age 2 y.o. , MRN: [id number] , ADDRESS: [address]  [place] [id number]  Chronic hypersensitive pneumonitis interstitial lung disease on prednisone daily 10 mg 03/19/2020 -   Chief Complaint  Patient presents with   Follow-up    Pt states she is doing better since last visit and states her breathing has also improved.     HPI [name] 70 y.o. -at last visit in March 2021 there was decline in lung function.  Therefore we started nintedanib.  She took 1 tablet of 150 mg and then had immediate abdominal pain and fever.  Therefore she is decided against taking this.  I also support this decision because I do not think she will tolerate nintedanib.  We did extensive interstitial lung disease question a history again at that time.  She had taken 1 dose of nitrofurantoin.  Have indicated to her that she should never take this medicine again.  In addition discovered that she might have some feather jackets at home she has  now gotten rid of it.  She is now doing pulmonary rehabilitation.  Her overnight oxygen desaturation test is negative.  At pulmonary rehabilitation she tells me she does not drop below 91% and 1 time she went to 89%.  Otherwise overall doing great.  Her cardiologist is increased her Lopressor.  Her daughter is getting married and she is busy with the wedding planning this is being emotionally stressful for her.  Her allergist has increased her recommendations and this is also helping her.  Her allergist is retiring and she wanted recommendations for new allergist.  I have recommended Dr. Fredderick Phenix and Dr. Remus Blake.  Incidentally that right-sided chest pain that she had is also better with increased prednisone of 10 mg/day.  She feels prednisone is working really well for her.    OV 08/29/2020   Subjective:  Patient ID: [name], female , DOB: 1950-12-28, age 20 y.o. years. , MRN: [id number],  ADDRESS: [address] [address] PCP  Hayden Rasmussen, MD Providers : Treatment Team:  Attending Provider: Brand Males, MD Patient Care Team: Hayden Rasmussen, MD as PCP - General (Family Medicine) Minus Breeding, MD as PCP - Cardiology (Cardiology) Brand Males, MD as Consulting Physician (Pulmonary Disease) Verl Blalock, Marijo Conception, MD (Inactive) (Cardiology) Kennith Center, RD as Dietitian (Family Medicine) Minus Breeding, MD as Consulting Physician (Cardiology)  Follow-up chronic hypersensitive pneumonitis on chronic prednisone 10 mg/day.  Last high-resolution CT chest March 2021.  Intolerant to nintedanib.  Chief Complaint  Patient presents with   Follow-up    Pt states she had been doing well since last visit up until 3-4 weeks ago and started having problems with her breathing and has had to use her O2 with activities. Pt also has been coughing a lot and is getting up clear phlegm. Pt also has had some mild wheeze.       HPI [name] 70 y.o. -returns for  follow-up.  Since I last saw her her daughter is now married.  She had a great wedding.  She was able to enjoy this and dance quite well.  She barely used her oxygen her effort tolerance was good.  Some 3 [place].  Apparently she had difficulty getting her oxygen tank to clinic to the electrical port.  She noticed with exertion a pulse ox dropping to the 70s.  She said even before she left a few days prior to that she started having increasing cough.  She  notices that these cough cycles get exacerbated in the fall in the winter.  Looking back at her pulmonary function test there is always a dip between October and March each year.  Between 2015 and 2017 there was a decline and since then there is a waxing and waning quality with dips in the winter in the fall season.  Each time she responds to prednisone.  Most recent pulmonary function test in August 2021 is actually improved compared to March 2021.  However today she is feeling worse.  She feels something is in her airway.  She feels dry air in the house and the animals bringing leaves from outside is contributing.  She says she feels better upstairs than downstairs where the dogs.  She again denies any mold or mildew.  Denies any feather jacket exposure.  She feels she will benefit from another round of prednisone.  She feels in the past antibiotics have not helped her as much as prednisone.   In terms of chronic ILD HP: She not tolerate nintedanib in the past.  I explained to her that I am concerned about progression and her decline in functional quality and the risk for that.  We discussed pirfenidone as an alternative.  Discussed the fact is less well studied although have no reason to see why it would not be effective.  Explained the side effect profile.  She is willing to try this.  We decided to go donor samples from the patient support group  Oxygen qualification: We walked her and she qualified  for oxygen today.  Is for portable oxygen with [employer]  Other issue: Spring allergies: -Her allergist is retired.  We discussed options of referral.  Referred her to Dr. Hardie Pulley referred to     OV 01/14/2021  Subjective:  Patient ID: [name], female , DOB: Nov 28, 1950 , age 5 y.o. , MRN: [id number] , AD[address]ESS: [address] [address] PCP Hayden Rasmussen, MD Patient Care Team: Hayden Rasmussen, MD as PCP - General (Family Medicine) Minus Breeding, MD as PCP - Cardiology (Cardiology) Brand Males, MD as Consulting Physician (Pulmonary Disease) Verl Blalock, Marijo Conception, MD (Inactive) (Cardiology) Kennith Center, RD as Dietitian (Family Medicine) Minus Breeding, MD as Consulting Physician (Cardiology)  This Provider for this visit: Treatment Team:  Attending Provider: Brand Males, MD    01/14/2021 -   Chief Complaint  Patient presents with   Follow-up    Nauseous, loss of appetite    Follow-up chronic hypersensitive pneumonitis on prednisone 5 mg/day and also pirfenidone submaximal dose of 2 pills 3 times daily since October 2021.  On ILD-pro registry protocol  - failed ofev   - failed esbrit due to side effects Faythe Ghee 2022  She is also on Breo  HPI Rhonda [name] 70 y.o. -returns for follow-up.  She is now on pirfenidone along with low-dose prednisone.  She feels the pirfenidone is not reacting well with her at all.  She has intermittent loss of taste at least twice a week.  She has loss of appetite and she has forces herself to eat.  She also has fatigue.  She says that if I strongly recommended then she will continue to soldier on with it.  Otherwise she feels less short of breath.  Pulmonary function test reviewed and is actually stable/slightly improved.  She says the cough is extremely well controlled except today after the pulmonary function test she was coughing quite a bit.  She is looking  forward to going back to swimming.  She has had a Covid vaccine  in booster and also status post EVUSHELD monoclonal antibody prophylaxis.  We discussed the possibility about possibility of enrolling in inhaled nitric oxide device/administration study.  This is to see if people can improve in the shortness of breath scale and also physical activity.  She took the consent form and is deliberating.  She thinks she might find the study and inconvenience to her lifestyle where she is wanting to be more active.  We went over several details of this research protocol  She has a new symptom of like itching sensation deep in the right flank.  She feels it is not the skin it is from deep inside.  She is wondering if it could be related to pirfenidone.  Started after going on pirfenidone.  Explained to her that I am not seeing this in other patients with pirfenidone but will have to be open minded.  She does not apply Vaseline to her skin in the winter.  We discussed the possibility this could be dry skin related but she does not have other features of dry skin.  Nevertheless she is willing to try Vaseline.   Results for Rhonda, [name] (MRN [id number]) as of 08/22/2019 11:56  Ref. Range 12/06/2013 13:03 03/15/2014 15:56 09/18/2014 11:41 02/26/2015 09:02 12/16/2015 16:31 04/29/2016 12:44 01/25/2017 10:16 01/11/2018 15:19 04/13/2018 08:47 08/16/2018 09:00 05/10/2019 09:42 3/8?21 07/18/20  FVC-Pre Latest Units: L 1.86 1.91 1.73 1.84 1.42  1.69 1.61 1.35 1.65 1.62 1.39 1.47  FVC-%Pred-Pre Latest Units: % '69 71 64 72 56 67 65 62 55 67 ' 67 57% 62%   Results for Kargbo, Malkie A (MRN [id number]) as of 08/22/2019 11:56  Ref. Range 12/06/2013 13:03 03/15/2014 15:56 09/18/2014 11:41 02/26/2015 09:02 12/16/2015 16:31 04/29/2016 12:44 01/25/2017 10:16 01/11/2018 15:19 04/13/2018 08:47 08/16/2018 09:00 05/10/2019 09:42 01/29/20 07/18/20  DLCO unc Latest Units: ml/min/mmHg 11.99 11.70 11.53 5.52 10.06 12.12 10.43 10.57 10.16 9.21 10.32 9.36 11.19  DLCO unc % pred Latest Units: % '63 62 61 31 57 64 55 56 57 52 ' 63 57% 68%       OV 07/08/2021  Subjective:  Patient ID: Rhonda [name], female , DOB: Apr 13, 1951 , age 59 y.o. , MRN: [id number] , ADDRESS: [id number] [place] [address] PCP Hayden Rasmussen, MD Patient Care Team: Hayden Rasmussen, MD as PCP - General (Family Medicine) Minus Breeding, MD as PCP - Cardiology (Cardiology) Brand Males, MD as Consulting Physician (Pulmonary Disease) Verl Blalock Marijo Conception, MD (Inactive) (Cardiology) Kennith Center, RD as Dietitian (Family Medicine) Minus Breeding, MD as Consulting Physician (Cardiology)  This Provider for this visit: Treatment Team:  Attending Provider: Brand Males, MD    07/08/2021 -  ACUTE VISIT Chief Complaint  Patient presents with   Acute Visit    Pt states that she did start taking zpak yesterday 8/15 and said her cough is doing some better after being on second day of abx and said that she started prednisone today 8/16. States that she still has increased SOB. Pt did take a covid test which came back negative.   Follow-up chronic hypersensitive pneumonitis on prednisone 5 mg/day and also pirfenidone submaximal dose of 2 pills 3 times daily since October 2021.  On ILD-pro registry protocol - failed ofev   - failed esbrit due to side effects marh 2022  She is also on Breo   HPI Rhonda [name] 70 y.o. -this is an acute visit.  Last seen in February 2022 after that she was supposed to see me back in 3 months.  But this visit has been scheduled acutely.  She tells me that early in May 2022 she went to [facility]  They rented a house.  She believes that the bedroom had some mold.  She did not visually confirmed the mold but husband might have.  She says shortly after going there and spending a week she started having worsening cough with hoarseness of voice.  This cough is persisted all along and the hoarseness has persisted as well.  The in June 2022 she had a short course of steroids that did not help.  She believes the  dosing was not strong enough.  In July 2022 she had some back issues and was given 30 mg of prednisone for approximately 10 days with a taper and this helped her back.  But all along the cough is persisted and severe hoarseness is persisted.  Then approximately a week ago she started getting shortness of breath along with yellow sputum.  The cough got worse .  She states even at baseline she brings her a lot of white sputum and has to cough a lot.  [place] and a couple of days into the illness.  At this point in time she started noticing she was more easily desaturating.  She is not able to swim.  She says walking to the car her pulse ox dropped to 84%.  Then the yellow phlegm started getting worse.  She called in yesterday and we prescribed Z-Pak and a prednisone burst.  She says now with the Z-Pak the sputum color is improving.  A week ago she also saw Dr. Blenda Nicely for chronic cough.  100 mg 3 times daily of gabapentin has been prescribed.  She is slowly increasing it.  She is worried about the side effects of this drug.  Apparently her vocal cord had ulcers from repeated coughing.  ILD symptom score shows worsening.  In the office she is very hoarse   Of note she stopped her pirfenidone in February/March 2022 because of side effects  She uses oxygen with exertion.  There is no leg swelling or hemoptysis.  Started z pak yesterday Started 12d pred taper yesterday    SYMPTOM SCALE - ILD Sept 2020 01/30/2020  08/29/2020  01/14/2021 146# 07/08/2021 147# - off esbriet. Sic past week  O2 use  o2 with ex o2 withe ex 2L     Shortness of Breath  0 -> 5 scale with 5 being worst (score 6 If unable to do)     At rest 0 0 1 0 1  Simple tasks - showers, clothes change, eating, shaving 01 1 1 1.5 2  Household (dishes, doing bed, laundry) 0 '3 3 3 4  ' Shopping '1 1 1 2 2  ' Walking level at own pace '1 2 2 1 2  ' Walking up Stairs '2 3 3 3 4  ' Total (30-36) Dyspnea Score '5  10 11 ' 10.5 15  How bad is your cough? '1 3 2 2 2  ' How bad is your fatigue '1 2 2 3 2  ' How bad is nausea  0 0 3 0  How bad is vomiting?   0 0 0 0  How bad is diarrhea?  00 1 0 0  How bad is anxiety?  0 0 0 00  How bad is depression  0 2 1  1     Simple office walk 185 feet x  3 laps goal with forehead probe 04/13/2018  08/16/2018  12/15/2018  05/09/2019  08/22/2019  01/30/2020  08/29/2020  01/14/2021  07/08/2021   O2 used Room air Room air Room air Room air Room air Room air ra ra ra  Number laps completed '3 3 3 3 2 ' stopped at 2 die to hip pain 3 laps - no hip pain following hip surgery '3 3 3 ' attempted byt did  only 2  Comments about pace good Moderate pace Normal, hip bothering     avg pace avg  Resting Pulse Ox/HR 98% and 73/min 98% and HR 77/min 99% and HR 61/min 98% and HR 70/min 98% 98% and 75/min 97% and 67/mi 94% and 80/min 96% and HR 72  Final Pulse Ox/HR 91% and 91/min 93% and 92.min 94% and 92/min 93% and 98/min 91%  89% and 93/,imn 88% and 94 89% ad 88.nin 88% at 2nd  lap end HR 92  Desaturated </= 88% no no no no       Desaturated <= 3% points yes yes Yes, 5 points Yes, 5 points    Yes, 5 points Yes, 8 points  Got Tachycardic >/= 90/min yes yes yes yes     yes  Symptoms at end of test none none Hip pain and very mild dyspnea  Stopped due to hip pain Moderate duyspnea with mask Mild to moderate dyspnea  Severe dyspnea  Miscellaneous comments x x    No hip pain          PFT  PFT Results Latest Ref Rng & Units 01/14/2021 11/06/2020 07/18/2020 01/29/2020 05/10/2019 08/16/2018 04/13/2018  FVC-Pre L 1.49 1.41 1.47 1.39 1.62 1.65 1.35  FVC-Predicted Pre % 62 59 62 57 67 67 55  FVC-Post L - - - - - - -  FVC-Predicted Post % - - - - - - -  Pre FEV1/FVC % % 92 93 88 91 89 90 93  Post FEV1/FCV % % - - - - - - -  FEV1-Pre L 1.37 1.31 1.30 1.26 1.45 1.49 1.26  FEV1-Predicted Pre % 77 73 73 69 80 81 68  FEV1-Post L - - - - - - -  DLCO uncorrected ml/min/mmHg 11.26 9.70 11.19 9.36 10.32 9.21  10.16  DLCO UNC% % 69 59 68 57 63 52 57  DLCO corrected ml/min/mmHg 11.26 10.53 11.19 9.36 - - -  DLCO COR %Predicted % 69 64 68 57 - - -  DLVA Predicted % 92 89 90 81 93 88 104  TLC L - - - - - - -  TLC % Predicted % - - - - - - -  RV % Predicted % - - - - - - -       has a past medical history of Allergy, Arthritis, Asthma, Cataract, Coronary artery calcification seen on CAT scan (08/19/2017), DDD (degenerative disc disease), thoracic, Depression, DOE (dyspnea on exertion), Fatty liver, GERD (gastroesophageal reflux disease), H/O steroid therapy, Heart palpitations (9/0/2409), Helicobacter pylori ab+, Hemorrhoids, Hiatal hernia, High cholesterol, History of chronic bronchitis, History of migraine, History of MRSA infection, Hyperlipidemia, mixed (08/19/2017), Hyperplastic colon polyp (2007), IBS (irritable bowel syndrome), Inguinal hernia, Insulin resistance, Interstitial lung disease ([place]), MVP (mitral valve prolapse), Pneumonia, Pneumonitis, hypersensitivity (HCC), PONV (postoperative nausea and vomiting), Pre-diabetes, Pulmonary fibrosis (HCC), PVC (premature ventricular contraction) (08/19/2017), Rapid heart rate, Squamous cell carcinoma of skin, and Tinnitus, right ear.   reports that she  has never smoked. She has never used smokeless tobacco.  Past Surgical History:  Procedure Laterality Date   56 HOUR [place] STUDY N/A 02/21/2018   Procedure: 24 HOUR PH STUDY;  Surgeon: Mauri Pole, MD;  Location: [facility];  Service: Endoscopy;  Laterality: N/A;   BREAST BIOPSY Right 2009   BIOPSY, pt denies   BREAST BIOPSY Left 2003   Benign    CATARACT EXTRACTION Left    CESAREAN SECTION     COLONOSCOPY     ESOPHAGEAL MANOMETRY N/A 02/21/2018   Procedure: ESOPHAGEAL MANOMETRY (EM);  Surgeon: Mauri Pole, MD;  Location: [facility];  Service: Endoscopy;  Laterality: N/A;   FOOT FRACTURE SURGERY  2006 or 2007   right   HYMENECTOMY     LUNG BIOPSY  09/28/2012   Procedure: LUNG  BIOPSY;  Surgeon: Melrose Nakayama, MD;  Location: [facility];  Service: Thoracic;  Laterality: N/A;  lung biopsies tims three   SQUAMOUS CELL CARCINOMA EXCISION Left    left arm   TOTAL HIP ARTHROPLASTY Right 09/10/2015   Procedure: RIGHT TOTAL HIP ARTHROPLASTY ANTERIOR APPROACH;  Surgeon: Paralee Cancel, MD;  Location: [facility];  Service: Orthopedics;  Laterality: Right;   TOTAL HIP ARTHROPLASTY Left 10/03/2019   Procedure: TOTAL HIP ARTHROPLASTY ANTERIOR APPROACH;  Surgeon: Paralee Cancel, MD;  Location: [facility];  Service: Orthopedics;  Laterality: Left;  70 mins   TUBAL LIGATION     UPPER GI ENDOSCOPY     VIDEO ASSISTED THORACOSCOPY  09/28/2012   Procedure: VIDEO ASSISTED THORACOSCOPY;  Surgeon: Melrose Nakayama, MD;  Location: [facility];  Service: Thoracic;  Laterality: Right;   VIDEO BRONCHOSCOPY  11/19/2011   Procedure: VIDEO BRONCHOSCOPY WITH FLUORO;  Surgeon: Brand Males, MD;  Location: [facility];  Service: Endoscopy;;    Allergies  Allergen Reactions   Atorvastatin Other (See Comments)    Leg pain   Betadine [Povidone Iodine] Other (See Comments)    blisters   Codeine Nausea And Vomiting   Garlic Diarrhea   Hydrocodone Nausea And Vomiting   Macrobid [Nitrofurantoin] Other (See Comments)   Ofev [Nintedanib] Other (See Comments)    Abdominal pain   Onion Diarrhea   Other Other (See Comments)   Rosuvastatin Other (See Comments)    Leg pain   Shellfish Allergy Nausea And Vomiting   Sulfa Antibiotics    Sulfonamide Derivatives Other (See Comments)    headaches    Immunization History  Administered Date(s) Administered   Fluad Quad(high Dose 65+) 07/17/2019   Hepatitis A 05/23/2010   Hepatitis B 06/23/2006   Influenza Split 08/11/2012, 08/14/2017   Influenza Whole 09/08/2011   Influenza, High Dose Seasonal PF 08/24/2016, 07/24/2017, 08/31/2018, 08/15/2020, 09/26/2020   Influenza,inj,Quad PF,6+ Mos 09/07/2013   Influenza-Unspecified 09/26/2014, 07/27/2015   MMR  05/23/2010   PFIZER(Purple Top)SARS-COV-2 Vaccination 12/16/2019, 01/08/2020, 07/18/2020, 09/26/2020   Pneumococcal Conjugate-13 03/15/2014   Pneumococcal Polysaccharide-23 01/11/2018, 09/26/2020   Td 02/22/2003   Tdap 10/28/2012   Zoster Recombinat (Shingrix) 02/15/2017, 05/17/2017   Zoster, Live 12/04/2010    Family History  Problem Relation Age of Onset   Emphysema Maternal Grandmother    Asthma Maternal Grandmother    Asthma Mother    Osteoarthritis Mother    Dementia Mother 42   Lymphoma Father    Diabetes Father    Hypertension Sister    Heart disease Sister    Kidney disease Sister    Lung disease Maternal Grandfather    Bone cancer Paternal Grandfather  Allergic rhinitis Daughter    Breast cancer Paternal Aunt    Ovarian cancer Maternal Aunt    Breast cancer Cousin    Colon cancer Neg Hx    Angioedema Neg Hx    Eczema Neg Hx    Immunodeficiency Neg Hx    Urticaria Neg Hx      Current Outpatient Medications:    acetaminophen (TYLENOL) 500 MG tablet, Tylenol Extra Strength, Disp: , Rfl:    albuterol (PROAIR HFA) 108 (90 Base) MCG/ACT inhaler, Inhale 2 puffs into the lungs every 4 (four) hours as needed for wheezing. Or coughing spells.  You may use 2 Puffs 5-10 minutes before exercise., Disp: 1 Inhaler, Rfl: 3   Alpha-D-Galactosidase (BEANO PO), Take 1 tablet by mouth as needed (if eating onion or garlic). , Disp: , Rfl:    Ascorbic Acid (VITAMIN C) 1000 MG tablet, Take 1,000 mg by mouth daily., Disp: , Rfl:    azelastine (ASTELIN) 0.1 % nasal spray, Place 1 spray into both nostrils at bedtime., Disp: , Rfl:    azithromycin (ZITHROMAX Z-PAK) 250 MG tablet, Take as directed., Disp: 6 each, Rfl: 0   CALCIUM CITRATE PO, Take 2 tablets by mouth daily., Disp: , Rfl:    chlorpheniramine (CHLOR-TRIMETON) 4 MG tablet, Take 2 mg by mouth 2 (two) times daily. Takes half tablet twice daily, Disp: , Rfl:    dicyclomine (BENTYL) 10 MG capsule, Take 10 mg by mouth as needed for  spasms., Disp: , Rfl:    EPINEPHrine 0.3 mg/0.3 mL IJ SOAJ injection, Inject into the muscle., Disp: , Rfl:    ezetimibe (ZETIA) 10 MG tablet, TAKE 1 TABLET (10 MG TOTAL) BY MOUTH DAILY. PLEASE KEEP UPCOMING APPT FOR FUTURE REFILLS. THANK YOU., Disp: 90 tablet, Rfl: 3   famotidine (PEPCID) 20 MG tablet, TAKE 1 TABLET BY MOUTH EVERY DAY AT NIGHT, Disp: , Rfl:    fluticasone (FLONASE) 50 MCG/ACT nasal spray, SPRAY 2 SPRAYS INTO EACH NOSTRIL EVERY DAY, Disp: 48 mL, Rfl: 1   fluticasone furoate-vilanterol (BREO ELLIPTA) 100-25 MCG/INH AEPB, Inhale 1 puff into the lungs daily., Disp: 180 each, Rfl: 3   gabapentin (NEURONTIN) 100 MG capsule, Take by mouth., Disp: , Rfl:    lactase (LACTAID) 3000 units tablet, Take by mouth., Disp: , Rfl:    levocetirizine (XYZAL) 5 MG tablet, Take 5 mg by mouth at bedtime., Disp: , Rfl:    metFORMIN (GLUCOPHAGE-XR) 500 MG 24 hr tablet, Take 500 mg by mouth daily with supper., Disp: , Rfl:    metoprolol succinate (TOPROL-XL) 50 MG 24 hr tablet, Take 1 tablet (50 mg total) by mouth daily., Disp: 90 tablet, Rfl: 3   montelukast (SINGULAIR) 10 MG tablet, TAKE 1 TABLET BY MOUTH EVERYDAY AT BEDTIME, Disp: 90 tablet, Rfl: 2   Multiple Vitamin (MULTIVITAMIN) tablet, Take 1 tablet by mouth daily., Disp: , Rfl:    nystatin-triamcinolone (MYCOLOG II) cream, nystatin-triamcinolone 100,000 unit/g-0.1 % topical cream, Disp: , Rfl:    omeprazole (PRILOSEC) 40 MG capsule, Take 1 capsule by mouth daily., Disp: , Rfl:    pravastatin (PRAVACHOL) 40 MG tablet, Take 1 tablet (40 mg total) by mouth every evening., Disp: 90 tablet, Rfl: 1   predniSONE (DELTASONE) 10 MG tablet, Take 46m for 3 days, then 337mfor 3 days, 2059mor 3 days, 71m76mr 3 days, then stop, Disp: 30 tablet, Rfl: 0   Probiotic Product (PROBIOTIC ADVANCED PO), Take 2 capsules by mouth daily. , Disp: , Rfl:  ranitidine (ZANTAC) 300 MG tablet, Take 300 mg by mouth at bedtime., Disp: , Rfl:    Vitamin D, Ergocalciferol,  (DRISDOL) 1.25 MG (50000 UNIT) CAPS capsule, Take 50,000 Units by mouth every 7 (seven) days., Disp: , Rfl:    nitroGLYCERIN (NITROSTAT) 0.4 MG SL tablet, Place 1 tablet (0.4 mg total) under the tongue every 5 (five) minutes as needed for chest pain., Disp: 25 tablet, Rfl: 12      Objective:   Vitals:   07/08/21 1339  BP: 122/70  Pulse: 74  Temp: 97.9 F (36.6 C)  TempSrc: Oral  SpO2: 96%  Weight: 147 lb 12.8 oz (67 kg)  Height: '4\' 9"'  (1.448 m)    Estimated body mass index is 31.98 kg/m as calculated from the following:   Height as of this encounter: '4\' 9"'  (1.448 m).   Weight as of this encounter: 147 lb 12.8 oz (67 kg).  '@WEIGHTCHANGE' @  [employer]   07/08/21 1339  Weight: 147 lb 12.8 oz (67 kg)     Physical Exam    eneral: No distress.  Coughing quite a bit and has extremely hoarse voice.  There is hoarseness  - worset since them the time I have known her Neuro: Alert and Oriented x 3. GCS 15. Speech normal Psych: Pleasant Resp:  Barrel Chest - n.  Wheeze - no, Crackles - Bilateral UL and LL like befroe, No overt respiratory distress CVS: Normal heart sounds. Murmurs - no Ext: Stigmata of Connective Tissue Disease - no HEENT: Normal upper airway. PEERL +. No post nasal drip        Assessment:       ICD-10-CM   1. Acute bronchitis, unspecified organism  J20.9     2. 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 oil pain and ? mold in house  J67.9     3. ILD (interstitial lung disease) ([place])  J84.9     4. Chronic respiratory failure with hypoxia (HCC)  J96.11     5. Cough with expectoration  R05.8     6. Hoarseness of voice  R49.0          Plan:     Patient Instructions     ICD-10-CM   1. Acute bronchitis, unspecified organism  J20.9     2. 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 oil pain and ? mold in house  J67.9     3. ILD (interstitial lung disease) ([place])  J84.9     4. Chronic respiratory failure with hypoxia (HCC)   J96.11     5. Cough with expectoration  R05.8     6. Hoarseness of voice  R49.0      I think there is acute bronchitis  I am also concerned that the mold and mountain exposure are triggering your fibrosis into worseing and flareup    Plan Chck cbc, bmet, LFT,  Check cxr 2 view rule out pneumonia Complete z pak started 07/07/21 Modify prednisone burst   -Please take Take prednisone 93m daily x 3 days, then 569mdaily x 3 days and then 4041mnce daily x 3 days, then 39m20mce daily x 3 days, then 20mg59me daily x 3 days, then prednisone 10mg 17m daily  x 3 days and then baseline dose INcrease o2 use to keep pulse ox > 88%  - monitor pulse ox   Start 3mL - 25msaline nebulizer daily 1-2 times to help clear sputum STart flutter valve Gabapentin per Dr MarcellBlenda Nicely  mountains and mold  If getting worse, call us and we will admit you for IV therapy of steroids/antibioptics  Followup 4 weeks do spirometry and dlco Return to see Dr Chase Caller in 4 weeks for 30 min face to face  - will decide on CT chst (last march 2021) based on above data    ( Level 05 visit: estb 40-54 min n  visit type: on-site physical face to visit  in total care time and counseling or/and coordination of care by this undersigned MD - Dr Brand Males. This includes one or more of the following on this same day 07/08/2021: pre-charting, chart review, note writing, documentation discussion of test results, diagnostic or treatment recommendations, prognosis, risks and benefits of management options, instructions, education, compliance or risk-factor reduction. It excludes time spent by the [place] or office staff in the care of the patient. Actual time 13 min)   SIGNATURE    Dr. Brand Males, M.D., F.C.C.P,  Pulmonary and Critical Care Medicine Staff Physician, [place] Director - Interstitial Lung Disease  Program  [facility] [place], [place], [address]  Pager: [phone], If no answer or between  15:00h - 7:00h: call [phone] Telephone: [phone]  2:25 PM 07/08/2021

## 2021-07-08 NOTE — Patient Instructions (Addendum)
ICD-10-CM   1. Acute bronchitis, unspecified organism  J20.9     2. 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 oil pain and ? mold in house  J67.9     3. ILD (interstitial lung disease) ([place])  J84.9     4. Chronic respiratory failure with hypoxia (HCC)  J96.11     5. Cough with expectoration  R05.8     6. Hoarseness of voice  R49.0      I think there is acute bronchitis  I am also concerned that the mold and mountain exposure are triggering your fibrosis into worseing and flareup    Plan Chck cbc, bmet, LFT,  Check cxr 2 view rule out pneumonia Complete z pak started 07/07/21 Modify prednisone burst   -Please take Take prednisone '60mg'$  daily x 3 days, then '50mg'$  daily x 3 days and then '40mg'$  once daily x 3 days, then '30mg'$  once daily x 3 days, then '20mg'$  once daily x 3 days, then prednisone '10mg'$  once daily  x 3 days and then baseline dose INcrease o2 use to keep pulse ox > 88%  - monitor pulse ox   Start 26m - 3% saline nebulizer daily 1-2 times to help clear sputum STart flutter valve Gabapentin per Dr MBlenda NicelyAvoid mountains and mold  If getting worse, [place] you for IV therapy of steroids/antibioptics  Followup 4 weeks do spirometry and dlco Return to see Dr RChase Callerin 4 weeks for 30 min face to face  - will decide on CT chst (last march 2021) based on above data

## 2021-07-09 ENCOUNTER — Other Ambulatory Visit: Payer: Self-pay | Admitting: Internal Medicine

## 2021-07-09 LAB — CBC WITH DIFFERENTIAL/PLATELET
Basophils Absolute: 0.1 10*3/uL (ref 0.0–0.1)
Basophils Relative: 0.7 % (ref 0.0–3.0)
Eosinophils Absolute: 0.1 10*3/uL (ref 0.0–0.7)
Eosinophils Relative: 0.5 % (ref 0.0–5.0)
HCT: 44.7 % (ref 36.0–46.0)
Hemoglobin: 14.7 g/dL (ref 12.0–15.0)
Lymphocytes Relative: 10.5 % — ABNORMAL LOW (ref 12.0–46.0)
Lymphs Abs: 1.2 10*3/uL (ref 0.7–4.0)
MCHC: 32.9 g/dL (ref 30.0–36.0)
MCV: 87.8 fl (ref 78.0–100.0)
Monocytes Absolute: 0.2 10*3/uL (ref 0.1–1.0)
Monocytes Relative: 1.7 % — ABNORMAL LOW (ref 3.0–12.0)
Neutro Abs: 9.4 10*3/uL — ABNORMAL HIGH (ref 1.4–7.7)
Neutrophils Relative %: 86.6 % — ABNORMAL HIGH (ref 43.0–77.0)
Platelets: 218 10*3/uL (ref 150.0–400.0)
RBC: 5.09 Mil/uL (ref 3.87–5.11)
RDW: 14.5 % (ref 11.5–15.5)
WBC: 10.9 10*3/uL — ABNORMAL HIGH (ref 4.0–10.5)

## 2021-07-09 LAB — BASIC METABOLIC PANEL
BUN: 15 mg/dL (ref 6–23)
CO2: 27 mEq/L (ref 19–32)
Calcium: 9.8 mg/dL (ref 8.4–10.5)
Chloride: 103 mEq/L (ref 96–112)
Creatinine, Ser: 1.19 mg/dL (ref 0.40–1.20)
GFR: 45.73 mL/min — ABNORMAL LOW (ref >60.00)
Glucose, Bld: 131 mg/dL — ABNORMAL HIGH (ref 70–99)
Potassium: 4.1 mEq/L (ref 3.5–5.1)
Sodium: 138 mEq/L (ref 135–145)

## 2021-07-09 LAB — HEPATIC FUNCTION PANEL
ALT: 15 U/L (ref 0–35)
AST: 22 U/L (ref 0–37)
Albumin: 3.9 g/dL (ref 3.5–5.2)
Alkaline Phosphatase: 65 U/L (ref 39–117)
Bilirubin, Direct: 0.1 mg/dL (ref 0.0–0.3)
Total Bilirubin: 0.5 mg/dL (ref 0.2–1.2)
Total Protein: 7.5 g/dL (ref 6.0–8.3)

## 2021-07-09 NOTE — Telephone Encounter (Signed)
Hey guys, I am send this both of you to be on the lookout for the form. Thanks so much!  I have just ordered an [employer] oxygen concentrator. They have sent a form y'all need to sign as soon as you can. After a year of research this is the best machine I think.  Thank you again.

## 2021-07-10 ENCOUNTER — Telehealth: Payer: Self-pay | Admitting: Internal Medicine

## 2021-07-10 NOTE — Telephone Encounter (Signed)
Pt came by office and while pt was here, form was gotten from MR's CMN folder by Vivia Ewing, LPN. Form was signed and faxed for pt. Nothing further needed.

## 2021-07-10 NOTE — Telephone Encounter (Signed)
EP can you see if MR has any form for this pt for [employer] to sign?  Thanks

## 2021-07-10 NOTE — Telephone Encounter (Signed)
Just ILD. No pneumonia. No fracture   DG Chest 2 View  Result Date: 07/09/2021 CLINICAL DATA:  Cough and shortness of breath. EXAM: CHEST - 2 VIEW COMPARISON:  10/08/2020 FINDINGS: Heart size remains normal limits. Low lung volumes again seen with diffuse coarse interstitial infiltrates, consistent with chronic interstitial lung disease. Mild scarring in the right middle lobe is unchanged. No evidence of superimposed pulmonary infiltrate or pleural effusion. IMPRESSION: Stable low lung volumes and chronic diffuse interstitial lung disease, suspicious for pulmonary fibrosis. No acute findings. Electronically Signed   By: Marlaine Hind M.D.   On: 07/09/2021 08:32

## 2021-07-10 NOTE — Telephone Encounter (Signed)
Form was received and is in MR's CMN folder with other papers that need to be signed. Routing to MR.

## 2021-07-10 NOTE — Telephone Encounter (Signed)
Call made to patient, confirmed DOB. Made aware of CXR results per MR.   [employer] form signed by MR and faxed back.   Call made to sarah with [employer], aware form is on its way. '  Form placed in MR box on pod.   Nothing further needed at this time.

## 2021-07-14 ENCOUNTER — Telehealth: Payer: Self-pay | Admitting: Internal Medicine

## 2021-07-14 ENCOUNTER — Ambulatory Visit (INDEPENDENT_AMBULATORY_CARE_PROVIDER_SITE_OTHER): Payer: Medicare Other | Admitting: Otolaryngology

## 2021-07-14 NOTE — Telephone Encounter (Signed)
There is a mychart encounter in regards to this as well. Please see that encounter for further information. Will close this encounter.

## 2021-07-30 DIAGNOSIS — Z23 Encounter for immunization: Secondary | ICD-10-CM | POA: Diagnosis not present

## 2021-08-14 ENCOUNTER — Ambulatory Visit (INDEPENDENT_AMBULATORY_CARE_PROVIDER_SITE_OTHER): Payer: Medicare Other | Admitting: Internal Medicine

## 2021-08-14 ENCOUNTER — Encounter: Payer: Self-pay | Admitting: Internal Medicine

## 2021-08-14 ENCOUNTER — Other Ambulatory Visit: Payer: Self-pay

## 2021-08-14 VITALS — BP 128/62 | HR 70 | Temp 98.2°F | Ht <= 58 in | Wt 149.0 lb

## 2021-08-14 DIAGNOSIS — J849 Interstitial pulmonary disease, unspecified: Secondary | ICD-10-CM

## 2021-08-14 DIAGNOSIS — Z5181 Encounter for therapeutic drug level monitoring: Secondary | ICD-10-CM | POA: Diagnosis not present

## 2021-08-14 DIAGNOSIS — J679 Hypersensitivity pneumonitis due to unspecified organic dust: Secondary | ICD-10-CM

## 2021-08-14 NOTE — Progress Notes (Signed)
#GE reflux with small hiatal hernia  - on ppi   #History of rapid heart rate not otherwise specified  - Start her on Lopressor  2008/2009 by primary care physician. History appears to correlate with onset of respiratory issues  - refuses to dc this drug as of 2014 discussion   # Hypersensitivity Pneumonitis and ILD  - Potential etiologies - cockateil x 2 for 18 years through end 2012. 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 Denies mold but lives in house built in [place] with a "weird baselment: and has humidifier  - first noted on CT chest 10/15/09 following trip to [place] (PE negative) but not described in 2003 CT chest report  - autoimmune panel: 10/23/11: Negative  -  Uderwennt bronch 11/19/11  - non diagnostic  - VATS Nov 2013 (done after initially refusing)- [place]. However, there is worrying trend of UIP pattern in the Upper lobes.            Oct/Nov 2012  March 2013 08/11/12  Nov/Dec 2013 Dx HP/UIP 01/02/2013  02/28/13 05/25/2013  12/06/2013  03/15/2014  06/29/2014  10'27/15 12/19/2014  02/26/15 01/13/2016  04/29/2016  10/23/2016  01/25/2017  06/17/2017 [facility] July 2018 01/11/2018  04/13/2018  08/16/2018   Symp/Signs Dyspnea x5y  New crackles  dimished dyspnea except at hill. Improved crackles.  Improved dyspnea except @ hill                 FVC  2.6 L    2.2 L/82%   2.1 L/80%   2.19 L/82%  2.2L/91% 2.1/81% 1.86/69% 1.9L/71% 1.86/75% 1.75L/65% 1.82/73% (dec 2015 offce spiro) 2.84L/72% 1.54/61% 1.78L/67%  1.69L/65%  1.61/62% 1.35/55% 1.65/67%  DLCO  10/50%    10/51%   9.6/51%     11.9/63% 11.7/62%  11.561%  5.5/31% 10.06/57% 11/04/63%  10.43/55% 8.56/50% 10.57/61% 10.16/57% 9.21/52%  Walk test 185 ft x 3 laps on RA  rest 92%. Pk exertion 90%. Pk HR 108 Rest 92%. Pk exertion 86% at 2.5 laps. Pk HR 102  pk exertion - 88% Rest 96%. Pk 95%. Pk HR is 75. Done at full dose  lopressor Rest 94%. Pk 91%. Rest HR 93 with pk HR 103/min. Done at half dose lopressor REst 96%. Pk pulse ox 92%. Rest HR 65. Pk HR 86 Did not desat. Normal Pk HR  Pulse ox 96%, droped to89% at 2nd lap but bounced up and ended at 91% 3rd lap Did not desat Did not desat, Pk HHR 93  ;llowest pulse ox 90% at 2nd lap but she held her breath, then 92% at 3rd lap 99% -> 91% at end of 3rd lap 94%, Dropped to 87% at 3rd lap, HR peak102/m 97%, dropped to 89% in 3rd lap - asymptomatic. HR 100/min No prob with 46md at [facility] 99% rest - -> dopped to 92% 3rd lap. Pkr HR 107/min 98% rest -> 91% 3rd lap with Pk HR 91/min   CT chest   unchanged          yes Ct mid dec possibly worse - unsure Ct - similar to April 2016 but worse since 2013   CT 06/24/17 - similar to June 2017     Tests/Bx Bronch 0- non diagnostic   VATS - HP with UIP in Upper lobe                   RX  Rx pred start 11/23/12 On  pred 38m per day and will reduce to 256mOn Pred 2058mer day On pred 27m60mr day On pred 5mg 27m day Rx pred 5mg d54my She reduced to pred 5mg x 70mt 1 month QOD due to GI concern side effects On pred 5mg qod49mt will increase to 5/d after this visit On pred 5mg dail81m On pred 5mg daily25mncrease pred to 27mg per d43meduce to pred 7.5 due to skin sissues  pred burs and reduce to 27mg per da47m                         OV 06/17/2017  Chief Complaint  Patient presents with   Follow-up    [name]. Pt denies significant cough, denies CP/tightness, f/c/s.     She is better. Feels she does not need o2. Has been to [facility] for transplant clinic; felt too early. Seems higher dose prednisone helping but then she is also bettter t his time of the year. In May 2018 I was at [facility] and curbsided national thought leaders - feel that we could explore silent active GERD as a possibility of ILD getting wors with time.   OV 01/11/2018  Chief Complaint  Patient presents with   Follow-up    PFT done today.  Pt  states she has been coughing x2 weeks since she returned from a trip to [place]. FroDelawareing, pt has had pain in ribs. Pt states she has mild SOB which is stable due to rehab.  Pt states that Dr. Richter is wDarron Doomto know if pt could possibly have eosinophilic esophagitis.   Went to [place]. RetDelawarend has been coughing x 2 weeks. Associated with this is some rib pain on right side. She feels she does not need o2. She feels she does not need pred/abx at this time. She also exposed to flu wit family   OV 02/08/2018  Chief Complaint  Patient presents with   Acute Visit    CXR done 02/07/18.  Pt states her throat is sore but not as bad as it was yesterday when in office and does have some left ear pain. Pt states she has some pain on right rib cage and has increased fatigue.     Ms. Carrero preSignerutely.  I last saw her one month ago gave her Tamiflu for prophylaxis after flu exposure.  At that point in time she was already reducing her chronic stable prednisone of 10 mg for interstitial lung disease/hypersensitivity pneumonitis.  She had tapered herself slowly 3 or 4 weeks ago to 5 mg.  She says she was doing fairly okay but in the last 2 or 3 weeks she has had increased fatigue.  She says she goes daily swimming and then feels energized but then an hour later starts having fatigue which is more than usual.  Also in the daytime she has random fatigue.  In terms of her respiratory symptoms these are stable and in fact slightly better in terms of shortness of breath and cough but she is having sore throat without any fever and some left-sided otalgia.  In addition she is having right-sided chest pains that are more than usual.  The chest pains have been reported before.  These are specifically at localized spots along the previous incision several years ago for interstitial lung disease.  They are tender as well  to touch.  This pain is worse.  In addition she is also being bothered by arthralgia in  her hand and left hip.  The left hip arthralgias old with a hand arthralgias new.  She is worried about pneumothorax recurrence and so she had a chest x-ray yesterday that shows no pneumothorax.  She has stable interstitial lung disease changes but in my personal visualization these interstitial lung disease changes are worse compared to several years ago.  We do know that she has slowly worsening interstitial lung disease.  In terms of a walking desaturation test this shows stability since last visit.  She has seen GI for acid reflux and has pH probe study coming up  In talking to her she tells me she has been on metformin for over a month or 2.  This is new and is meant for pre-diabetes.  Apparently her hemoglobin A1c is always below 6 but is above 5.5.  Walking desaturation test on 02/08/2018 185 feet x 3 laps on ROOM AIR:  did walk normal pace with forehead probe desaturate. Rest pulse ox was 99%, final pulse ox was 93%. HR response 72/min at rest to 93/min at peak exertion. Patient ALANDA COLTON  Did not Desaturate < 88% . Rhonda [name] yes did  Desaturated </= 3% points. Rhonda [name] yes did get tachyardic   Dg Chest 2 View  Result Date: 02/07/2018 CLINICAL DATA:  Chest wall pain for 2 weeks EXAM: CHEST - 2 VIEW COMPARISON:  CT 08/20/2017, radiograph 10/11/2015 FINDINGS: Coarse interstitial pattern consistent with pulmonary fibrosis, similar distribution compared to prior. No focal pulmonary opacity or pleural effusion. Stable cardiomediastinal silhouette with aortic atherosclerosis. No pneumothorax. Degenerative changes of the spine. IMPRESSION: No active cardiopulmonary disease. Similar appearance of diffuse pulmonary fibrosis. Negative for a pneumothorax. Electronically Signed   By: Donavan Foil M.D.   On: 02/07/2018 14:26    OV 04/13/2018  Chief Complaint  Patient presents with   Follow-up    Pt had pre spiro and DLCO PFT prior to OV. Pt has increase of productive cough-thick  white in last week. Pt has some wheezing, and allergy issues.   Ms. Freda Munro presents for routine follow-up.  I saw her acutely just approximately 2 months ago.  Then end of April 2019 she saw a nurse practitioner again acutely and was given antibiotic and prednisone.  She says she felt better after that but in the last week or so due to increased pollen load in the community and also moving furniture because her daughter is relocating out of her home she started having more cough.  There is no change in her dyspnea with the cough is really bad.  This is despite Singulair and Chlor-Trimeton.  Those things to help but just take the edge off.  She feels that the pollen making the cough worse.  There is no change in dyspnea.  Walking desaturation test documented below his baseline.  She had pulmonary function test today but the [facility] shows significant decline compared to February 2019 and she feels surprised by this.  DLCO is baseline unchanged and a walking desaturation test limited in the office is also unchanged.  She does not have any fever but has significant postnasal drip.  She has not followed up at [facility] transplant clinic or the allergist recently.  OV 08/16/2018  Subjective:  Patient ID: Rhonda [name], female , DOB: 1951-02-27 , age 30 y.o. , MRN: [id number] , ADDRESS: [address]  [place] [address]   08/16/2018 -   Chief Complaint  Patient presents with   Follow-up    PFT performed today.  Pt states she has had a lot of mucus the month of September 2019 so she has been taking the Chlor-Trimeton. States other than that she has been able to do a lot more activities and has been swimming a lot more. Pt states she believes the SOB is stable.     HPI CIELA MAHAJAN 70 y.o. -continues to do well.  She is taking higher dose of prednisone 10 mg/day.  Also the summer usually she is doing well.  She is not having much of a cough.  Lung function test shows improvement compared to  tests done earlier this year.  In fact she is almost as good as much 2018.  Still compared to 2014-2016 she has progressive lung disease.  Walking desaturation test is stable.  She needs a high-dose flu shot but we do not have stock today.  She has lost some weight intentionally with better dietary control.  She is exercising regularly swimming.  We discussed ofev  potential future therapy if the drug is approved for this indication.  We will know study results in a year or so.  She is open to the idea but is worried somewhat about her irritable bowel syndrome flaring up.  She is no longer following at the [facility] lung transplant clinic given stability lung function  Other issues: She always has right-sided chest wall pain to the site of lung biopsy.  The only way this is been resolved as by her limiting her use of bra .  Also her irritable bowel syndrome is under control but in case it flares up she wants a refill of dicyclomine     OV 12/15/2018  Subjective:  Patient ID: Rhonda [name], female , DOB: August 14, 1951 , age 63 y.o. , MRN: [id number] , ADDRESS: [address] [place] [address]   12/15/2018 -   Chief Complaint  Patient presents with   Follow-up    Pt states she has been doing better since last visit with TP but states she has been having pain in her rib cage x2 years but states it has become more intense.     HPI HINATA DIENER 70 y.o. -returns for follow-up of her ILD due to hypersensitivity pneumonitis.  She did a clinic visit also for research with the ILD-pro registry study.  Since I last saw her in September 2019 she had a flareup and required higher doses of prednisone.  Since then she is returned to baseline of 5 mg prednisone and she feels good.  She is stable.  Walking desaturation test shows stability.  We have been discussing over the last few months about taking nintedanib based on new data and progressive non--IPF ILD's.  She has trepidation for this because  of irritable bowel syndrome.  After much discussion she is willing to try 100 mg once daily for a month and then escalate to 100 mg twice daily which is the therapeutic dose.  She will do this once after insurance approval which we suspect will happen over the next few months.  Her main issue is that her right sided infra-axillary chest pain is getting worse.  This started after her surgical lung biopsy and pneumothorax on the right side.  It is random and happens with twisting.  But now days it happens once every few days.  Previously it used to happen  once every few weeks.  Sometimes it is excruciating but then she is left in pain for a few days.  Applying Abrol sudden twisting of her body makes it worse.  Is a lancinating pain.  She did try gabapentin some years ago for chronic cough .  She did tolerate the gabapentin well.  She does not remember if it helped the pain or not but clearly back and the pain was much less intense.  She is willing to try this again.  I recommended a CT scan of the chest but she is going to have radiation for a CT coronary angiogram because of high levels of coronary artery calcification.  Therefore written to the radiologist to see if he can look at the lung parenchyma with a CT angiogram    OV 05/09/2019  Subjective:  Patient ID: Rhonda [name], female , DOB: 01-05-1951 , age 31 y.o. , MRN: [id number] , ADDRESS: [address] [place] [address]   05/09/2019 -   Chief Complaint  Patient presents with   ILD (Interstitial Lung Disease)     HPI NYSHA KOPLIN 70 y.o. -follow-up for chronic care physician pneumonitis on daily 5 mg prednisone.  She has problems with right-sided chest wall pain that is neuropathic.  She tells me since her last visit January 2020 she has been isolating and social distancing because of the pandemic.  Overall she is been stable.  Her symptom scores are mild and documented below.  She continues with prednisone.  She was supposed to  have pulmonary function test but this was held because of the pandemic.  Her walking desaturation test shows she is stable.  She is supposed to have pulmonary function test tomorrow but wanted to see me today.  In the interim she did have a cardiac CT scan of the chest that shows 94th percentile of coronary calcium.  She could not get a CT angiogram done because of PVC.  A nuclear medicine stress test was done and I reviewed this result and it was normal.  A Holter test is pending.  She is kind of nervous because of the ongoing COVID-19 situation and safety of doing a Holter test.  In terms of her right chest wall pain she is adapted to it.  She does not use any bra  OV 08/22/2019  Subjective:  Patient ID: Rhonda [name], female , DOB: [birth year] , age 54 y.o. , MRN: [id number] , ADDRESS: [address] [place] [address]   08/22/2019 -   Chief Complaint  Patient presents with   Follow-up    needs sx clearance for L hip replacement- not yet scheduled, pending clearance.     Patient lung disease chronic hypersensitive pneumonitis on chronic daily prednisone 5 mg/day  HPI ILLYANNA PETILLO 71 y.o. -presents for follow-up of her interstitial lung disease due to chronic hypersensitive pneumonitis.  After last visit in June 2020 given her PFT stability and also her aversion to the potential side effects with nintedanib she decided to just continue with prednisone daily 5 mg/day.  Given the pandemic she is walking 1 mile a day without stopping.  Her interstitial lung disease symptom score is actually slightly better.  Overall she reports stability in her interstitial lung disease.  She has a new issue of getting preoperative clearance for her left hip.  She says the left hip is bone-on-bone and she does not want to take Aleve.  She prefers to have surgery.  Dr. Paralee Cancel is  the surgeon.  Surgery date has not been set.  She is very functional.  She has normal renal function.  Normal nutritional  status.  No anemia.  No respiratory infections in the last 1 month.  She is not on oxygen.  Age 86.  The surgeries in the left hip and the duration of surgery is under 3 hours.  Anesthesia will be general.      OV 01/30/2020  Subjective:  Patient ID: Rhonda [name], female , DOB: [birth year] , age 21 y.o. , MRN: [id number] , AD[address]ESS: [address] [address] Patient lung disease chronic hypersensitive pneumonitis on chronic daily prednisone 5 mg/day  01/30/2020 -   Chief Complaint  Patient presents with   Follow-up    PFT performed 3/8.  Pt states she believes she has been going downhill for the past 2 months. Pt was started on O2 at last OV and wears it prn. Pt does have an occ cough.     HPI CANDUS BRAUD 70 y.o. -presents for follow-up of interstitial lung disease secondary to chronic hypersensitive pneumonitis.  She maintains herself on prednisone 5 mg/day.  In the interim she has had hip surgery and this did wonders for her.  She is able to walk longer distances.  However she does notice that her dyspnea has declined.  She says since January 2021 his symptoms have declined particularly with cough.  Usually in the winter she goes through a cycle of severe cough and exacerbation that requires higher levels of prednisone.  She has been social distancing well and masking well.  She states there is absolutely no mold or water any antigen exposure at home.  But she feels it is her allergies acting up and making her more symptomatic.  She has had a Covid vaccine and is wondering about either going back to swimming or starting pulmonary rehabilitation.  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  In fact when we walked her today with the mask she did drop down to 89%.  This is a decline for her.  She had pulmonary function test and this shows significant decline in FVC and DLCO.  In review of this  that.  Where she has declined this much but is always bounce back with steroids.  At last visit we discussed nintedanib for her as a way to prevent progression of her ILD but given her concerns of GI side effects and the presence of irritable bowel syndrome she is declined.  Infective have this conversation with her including her joining the [employer] at [facility] but she has always been worried about the GI side effects with nintedanib.  This time she is more open to it because of the decline in lung function.  She wants to see an allergist  She also told me that she continues to have a restrictive chest pain particularly on the right side lower area.  She no longer wears a brassiere it has been nearly 3 years since she had a high-resolution CT chest.  She is open to having one now.  Last liver function test was normal in January 2020.  Last renal function was normal in November 2020.      OV 03/19/2020  Subjective:  Patient ID: Rhonda [name], female , DOB: 1951/09/18 , age 72 y.o. , MRN: [id number] , ADDRESS: [address] [place] [address]  Chronic  hypersensitive pneumonitis interstitial lung disease on prednisone daily 10 mg 03/19/2020 -   Chief Complaint  Patient presents with   Follow-up    Pt states she is doing better since last visit and states her breathing has also improved.     HPI MYSTIQUE BJELLAND 70 y.o. -at last visit in March 2021 there was decline in lung function.  Therefore we started nintedanib.  She took 1 tablet of 150 mg and then had immediate abdominal pain and fever.  Therefore she is decided against taking this.  I also support this decision because I do not think she will tolerate nintedanib.  We did extensive interstitial lung disease question a history again at that time.  She had taken 1 dose of nitrofurantoin.  Have indicated to her that she should never take this medicine again.  In addition discovered that she might have some feather jackets at home she has  now gotten rid of it.  She is now doing pulmonary rehabilitation.  Her overnight oxygen desaturation test is negative.  At pulmonary rehabilitation she tells me she does not drop below 91% and 1 time she went to 89%.  Otherwise overall doing great.  Her cardiologist is increased her Lopressor.  Her daughter is getting married and she is busy with the wedding planning this is being emotionally stressful for her.  Her allergist has increased her recommendations and this is also helping her.  Her allergist is retiring and she wanted recommendations for new allergist.  I have recommended Dr. Fredderick Phenix and Dr. Remus Blake.  Incidentally that right-sided chest pain that she had is also better with increased prednisone of 10 mg/day.  She feels prednisone is working really well for her.    OV 08/29/2020   Subjective:  Patient ID: Rhonda [name], female , DOB: 1951/04/22, age 37 y.o. years. , MRN: [id number],  ADDRESS: [address] [address] PCP  Hayden Rasmussen, MD Providers : Treatment Team:  Attending Provider: Brand Males, MD Patient Care Team: Hayden Rasmussen, MD as PCP - General (Family Medicine) Minus Breeding, MD as PCP - Cardiology (Cardiology) Brand Males, MD as Consulting Physician (Pulmonary Disease) Verl Blalock, Marijo Conception, MD (Inactive) (Cardiology) Kennith Center, RD as Dietitian (Family Medicine) Minus Breeding, MD as Consulting Physician (Cardiology)  Follow-up chronic hypersensitive pneumonitis on chronic prednisone 10 mg/day.  Last high-resolution CT chest March 2021.  Intolerant to nintedanib.  Chief Complaint  Patient presents with   Follow-up    Pt states she had been doing well since last visit up until 3-4 weeks ago and started having problems with her breathing and has had to use her O2 with activities. Pt also has been coughing a lot and is getting up clear phlegm. Pt also has had some mild wheeze.       HPI CHANDA LAPERLE 69 y.o. -returns for  follow-up.  Since I last saw her her daughter is now married.  She had a great wedding.  She was able to enjoy this and dance quite well.  She barely used her oxygen her effort tolerance was good.  Some 3 [place]  Apparently she had difficulty getting her oxygen tank to clinic to the electrical port.  She noticed with exertion a pulse ox dropping to the 70s.  She said even before she left a few days prior to that she started having increasing cough.  She notices that these cough  cycles get exacerbated in the fall in the winter.  Looking back at her pulmonary function test there is always a dip between October and March each year.  Between 2015 and 2017 there was a decline and since then there is a waxing and waning quality with dips in the winter in the fall season.  Each time she responds to prednisone.  Most recent pulmonary function test in August 2021 is actually improved compared to March 2021.  However today she is feeling worse.  She feels something is in her airway.  She feels dry air in the house and the animals bringing leaves from outside is contributing.  She says she feels better upstairs than downstairs where the dogs.  She again denies any mold or mildew.  Denies any feather jacket exposure.  She feels she will benefit from another round of prednisone.  She feels in the past antibiotics have not helped her as much as prednisone.   In terms of chronic ILD HP: She not tolerate nintedanib in the past.  I explained to her that I am concerned about progression and her decline in functional quality and the risk for that.  We discussed pirfenidone as an alternative.  Discussed the fact is less well studied although have no reason to see why it would not be effective.  Explained the side effect profile.  She is willing to try this.  We decided to go donor samples from the patient support group  Oxygen qualification: We walked her and she qualified  for oxygen today.  Is for portable oxygen with [employer]  Other issue: Spring allergies: -Her allergist is retired.  We discussed options of referral.  Referred her to Dr. Hardie Pulley referred to     OV 01/14/2021  Subjective:  Patient ID: Rhonda [name], female , DOB: [birth year] , age 36 y.o. , MRN: [id number] , AD[address]ESS: [address] [address] PCP Hayden Rasmussen, MD Patient Care Team: Hayden Rasmussen, MD as PCP - General (Family Medicine) Minus Breeding, MD as PCP - Cardiology (Cardiology) Brand Males, MD as Consulting Physician (Pulmonary Disease) Verl Blalock, Marijo Conception, MD (Inactive) (Cardiology) Kennith Center, RD as Dietitian (Family Medicine) Minus Breeding, MD as Consulting Physician (Cardiology)  This Provider for this visit: Treatment Team:  Attending Provider: Brand Males, MD    01/14/2021 -   Chief Complaint  Patient presents with   Follow-up    Nauseous, loss of appetite    Follow-up chronic hypersensitive pneumonitis on prednisone 5 mg/day and also pirfenidone submaximal dose of 2 pills 3 times daily since October 2021.  On ILD-pro registry protocol  - failed ofev   - failed esbrit due to side effects Faythe Ghee 2022  She is also on Breo  HPI ELAH AVELLINO 70 y.o. -returns for follow-up.  She is now on pirfenidone along with low-dose prednisone.  She feels the pirfenidone is not reacting well with her at all.  She has intermittent loss of taste at least twice a week.  She has loss of appetite and she has forces herself to eat.  She also has fatigue.  She says that if I strongly recommended then she will continue to soldier on with it.  Otherwise she feels less short of breath.  Pulmonary function test reviewed and is actually stable/slightly improved.  She says the cough is extremely well controlled except today after the pulmonary function test she was coughing quite a bit.  She is looking forward to going back  to swimming.  She has had a Covid vaccine  in booster and also status post EVUSHELD monoclonal antibody prophylaxis.  We discussed the possibility about possibility of enrolling in inhaled nitric oxide device/administration study.  This is to see if people can improve in the shortness of breath scale and also physical activity.  She took the consent form and is deliberating.  She thinks she might find the study and inconvenience to her lifestyle where she is wanting to be more active.  We went over several details of this research protocol  She has a new symptom of like itching sensation deep in the right flank.  She feels it is not the skin it is from deep inside.  She is wondering if it could be related to pirfenidone.  Started after going on pirfenidone.  Explained to her that I am not seeing this in other patients with pirfenidone but will have to be open minded.  She does not apply Vaseline to her skin in the winter.  We discussed the possibility this could be dry skin related but she does not have other features of dry skin.  Nevertheless she is willing to try Vaseline.   Results for MIKIA, DELALUZ (MRN [id number]) as of 08/22/2019 11:56  Ref. Range 12/06/2013 13:03 03/15/2014 15:56 09/18/2014 11:41 02/26/2015 09:02 12/16/2015 16:31 04/29/2016 12:44 01/25/2017 10:16 01/11/2018 15:19 04/13/2018 08:47 08/16/2018 09:00 05/10/2019 09:42 3/8?21 07/18/20  FVC-Pre Latest Units: L 1.86 1.91 1.73 1.84 1.42  1.69 1.61 1.35 1.65 1.62 1.39 1.47  FVC-%Pred-Pre Latest Units: % '69 71 64 72 56 67 65 62 55 67 ' 67 57% 62%   Results for Goulart, Kizzi A (MRN [id number]) as of 08/22/2019 11:56  Ref. Range 12/06/2013 13:03 03/15/2014 15:56 09/18/2014 11:41 02/26/2015 09:02 12/16/2015 16:31 04/29/2016 12:44 01/25/2017 10:16 01/11/2018 15:19 04/13/2018 08:47 08/16/2018 09:00 05/10/2019 09:42 01/29/20 07/18/20  DLCO unc Latest Units: ml/min/mmHg 11.99 11.70 11.53 5.52 10.06 12.12 10.43 10.57 10.16 9.21 10.32 9.36 11.19  DLCO unc % pred Latest Units: % '63 62 61 31 57 64 55 56 57 52 ' 63 57% 68%       OV 07/08/2021  Subjective:  Patient ID: Rhonda [name], female , DOB: 20-Dec-1950 , age 24 y.o. , MRN: [id number] , ADDRESS: [address] [place] [address] PCP Hayden Rasmussen, MD Patient Care Team: Hayden Rasmussen, MD as PCP - General (Family Medicine) Minus Breeding, MD as PCP - Cardiology (Cardiology) Brand Males, MD as Consulting Physician (Pulmonary Disease) Verl Blalock Marijo Conception, MD (Inactive) (Cardiology) Kennith Center, RD as Dietitian (Family Medicine) Minus Breeding, MD as Consulting Physician (Cardiology)  This Provider for this visit: Treatment Team:  Attending Provider: Brand Males, MD    07/08/2021 -  ACUTE VISIT Chief Complaint  Patient presents with   Acute Visit    Pt states that she did start taking zpak yesterday 8/15 and said her cough is doing some better after being on second day of abx and said that she started prednisone today 8/16. States that she still has increased SOB. Pt did take a covid test which came back negative.   Follow-up chronic hypersensitive pneumonitis on prednisone 5 mg/day and also pirfenidone submaximal dose of 2 pills 3 times daily since October 2021.  On ILD-pro registry protocol - failed ofev   - failed esbrit due to side effects marh 2022  She is also on Breo   HPI HADIYAH MARICLE 70 y.o. -this is an acute visit.  Last seen in February  2022 after that she was supposed to see me back in 3 months.  But this visit has been scheduled acutely.  She tells me that early in May 2022 she went to [facility]  They rented a house.  She believes that the bedroom had some mold.  She did not visually confirmed the mold but husband might have.  She says shortly after going there and spending a week she started having worsening cough with hoarseness of voice.  This cough is persisted all along and the hoarseness has persisted as well.  The in June 2022 she had a short course of steroids that did not help.  She believes the  dosing was not strong enough.  In July 2022 she had some back issues and was given 30 mg of prednisone for approximately 10 days with a taper and this helped her back.  But all along the cough is persisted and severe hoarseness is persisted.  Then approximately a week ago she started getting shortness of breath along with yellow sputum.  The cough got worse .  She states even at baseline she brings her a lot of white sputum and has to cough a lot.  [place] and a couple of days into the illness.  At this point in time she started noticing she was more easily desaturating.  She is not able to swim.  She says walking to the car her pulse ox dropped to 84%.  Then the yellow phlegm started getting worse.  She called in yesterday and we prescribed Z-Pak and a prednisone burst.  She says now with the Z-Pak the sputum color is improving.  A week ago she also saw Dr. Blenda Nicely for chronic cough.  100 mg 3 times daily of gabapentin has been prescribed.  She is slowly increasing it.  She is worried about the side effects of this drug.  Apparently her vocal cord had ulcers from repeated coughing.  ILD symptom score shows worsening.  In the office she is very hoarse   Of note she stopped her pirfenidone in February/March 2022 because of side effects - gerd  She uses oxygen with exertion.  There is no leg swelling or hemoptysis.  Started z pak yesterday Started 12d pred taper yesterday     PFT   OV 08/14/2021  Subjective:  Patient ID: Rhonda [name], female , DOB: [birth year] , age 35 y.o. , MRN: [id number] , AD[address]ESS: [address] [place] [address] PCP Hayden Rasmussen, MD Patient Care Team: Hayden Rasmussen, MD as PCP - General (Family Medicine) Minus Breeding, MD as PCP - Cardiology (Cardiology) Brand Males, MD as Consulting Physician (Pulmonary Disease) Verl Blalock, Marijo Conception, MD (Inactive) (Cardiology) Kennith Center, RD as Dietitian (Family  Medicine) Minus Breeding, MD as Consulting Physician (Cardiology)  This Provider for this visit: Treatment Team:  Attending Provider: Brand Males, MD  Follow-up chronic hypersensitive pneumonitis on prednisone 5 mg/day and also pirfenidone submaximal dose of 2 pills 3 times daily since October 2021.  On ILD-pro registry protocol - failed ofev   - failed esbrit due to side effects Faythe Ghee 2022  She is also on Breo  08/14/2021 -   Chief Complaint  Patient presents with   Follow-up    Pt states she is feeling better since last visit. States she did receive her lighterweight POC which has been working well for her.     HPI CHELESEA WEIAND 70 y.o. -  seen last month with worsening symptoms acute bronchitis/flare.  Given Z-Pak and prednisone.  She is significantly better.  She tells me that even though she is better and his subjective symptom score is better below.  She still feels not back to her May 2022 baseline.  She says in the neighborhood she has to use more oxygen.  She is interested in a backpack for oxygen.  She is not able to swim as well as she used to.  She says acid reflux is under better control now after seeing Dr. Blenda Nicely in ENT.  She is wondering about starting pirfenidone she definitely does not want to do nintedanib again.  But even with pirfenidone she is reluctant.  We discussed CellCept because of progression but because of the immunosuppression she is reluctant.  We resolved that she would increase the prednisone to 10 mg/day and reassess.  She was supposed to have done a spirometry today but it did not happen.  She will have it done again in 4 weeks.  Have requested a schedule for that.    The main concern right now even though is better is that she is not to her baseline as of a year ago and she feels like she is having slowly progressive disease interstitial lung disease/chronic HP.    SYMPTOM SCALE - ILD Sept 2020 01/30/2020  08/29/2020  01/14/2021 146#  07/08/2021 147# - off esbriet. Sic past week 08/14/2021 149# - pred only. No antifibrotic  O2 use  o2 with ex o2 withe ex 2L      Shortness of Breath  0 -> 5 scale with 5 being worst (score 6 If unable to do)      At rest 0 0 1 0 1 0  Simple tasks - showers, clothes change, eating, shaving 01 1 1 1.'5 2 1  ' Household (dishes, doing bed, laundry) 0 '3 3 3 4 2  ' Shopping '1 1 1 2 2 1  ' Walking level at own pace '1 2 2 1 2 2  ' Walking up Stairs '2 3 3 3 4 3  ' Total (30-36) Dyspnea Score '5 10 11 ' 10.'5 15 9  ' How bad is your cough? '1 3 2 2 2 2  ' How bad is your fatigue '1 2 2 3 2 ' 0  How bad is nausea  0 0 3 0 0  How bad is vomiting?   0 0 0 0 0  How bad is diarrhea?  00 1 0 0 0  How bad is anxiety?  0 0 0 00 0  How bad is depression  0 '2 1 1 ' 0     Simple office walk 185 feet x  3 laps goal with forehead probe 04/13/2018  08/16/2018  12/15/2018  05/09/2019  08/22/2019  01/30/2020  08/29/2020  01/14/2021  07/08/2021  08/14/2021   O2 used Room air Room air Room air Room air Room air Room air ra ra ra ra  Number laps completed '3 3 3 3 2 ' stopped at 2 die to hip pain 3 laps - no hip pain following hip surgery '3 3 3 ' attempted byt did  only 2 3 bu stopped at 2  Comments about pace good Moderate pace Normal, hip bothering     avg pace avg   Resting Pulse Ox/HR 98% and 73/min 98% and HR 77/min 99% and HR 61/min 98% and HR 70/min 98% 98% and 75/min 97% and 67/mi 94% and 80/min 96% and HR 72 98% and 71  Final Pulse Ox/HR  91% and 91/min 93% and 92.min 94% and 92/min 93% and 98/min 91%  89% and 93/,imn 88% and 94 89% ad 88.nin 88% at 2nd  lap end HR 92 86% and 96  Desaturated </= 88% no no no no        Desaturated <= 3% points yes yes Yes, 5 points Yes, 5 points    Yes, 5 points Yes, 8 points Yes, 12 pints  Got Tachycardic >/= 90/min yes yes yes yes     yes yes  Symptoms at end of test none none Hip pain and very mild dyspnea  Stopped due to hip pain Moderate duyspnea with mask Mild to moderate dyspnea  Severe dyspnea  Mild dyspnea  Miscellaneous comments x x    No hip pain    Similar to lastime      PFT  PFT Results Latest Ref Rng & Units 01/14/2021 11/06/2020 07/18/2020 01/29/2020 05/10/2019 08/16/2018 04/13/2018  FVC-Pre L 1.49 1.41 1.47 1.39 1.62 1.65 1.35  FVC-Predicted Pre % 62 59 62 57 67 67 55  FVC-Post L - - - - - - -  FVC-Predicted Post % - - - - - - -  Pre FEV1/FVC % % 92 93 88 91 89 90 93  Post FEV1/FCV % % - - - - - - -  FEV1-Pre L 1.37 1.31 1.30 1.26 1.45 1.49 1.26  FEV1-Predicted Pre % 77 73 73 69 80 81 68  FEV1-Post L - - - - - - -  DLCO uncorrected ml/min/mmHg 11.26 9.70 11.19 9.36 10.32 9.21 10.16  DLCO UNC% % 69 59 68 57 63 52 57  DLCO corrected ml/min/mmHg 11.26 10.53 11.19 9.36 - - -  DLCO COR %Predicted % 69 64 68 57 - - -  DLVA Predicted % 92 89 90 81 93 88 104  TLC L - - - - - - -  TLC % Predicted % - - - - - - -  RV % Predicted % - - - - - - -       has a past medical history of Allergy, Arthritis, Asthma, Cataract, Coronary artery calcification seen on CAT scan (08/19/2017), DDD (degenerative disc disease), thoracic, Depression, DOE (dyspnea on exertion), Fatty liver, GERD (gastroesophageal reflux disease), H/O steroid therapy, Heart palpitations (11/28/1094), Helicobacter pylori ab+, Hemorrhoids, Hiatal hernia, High cholesterol, History of chronic bronchitis, History of migraine, History of MRSA infection, Hyperlipidemia, mixed (08/19/2017), Hyperplastic colon polyp (2007), IBS (irritable bowel syndrome), Inguinal hernia, Insulin resistance, Interstitial lung disease (), MVP (mitral valve prolapse), Pneumonia, Pneumonitis, hypersensitivity (HCC), PONV (postoperative nausea and vomiting), Pre-diabetes, Pulmonary fibrosis (HCC), PVC (premature ventricular contraction) (08/19/2017), Rapid heart rate, Squamous cell carcinoma of skin, and Tinnitus, right ear.   reports that she has never smoked. She has never used smokeless tobacco.  Past Surgical History:  Procedure Laterality Date    19 HOUR [place] STUDY N/A 02/21/2018   Procedure: 24 HOUR PH STUDY;  Surgeon: Mauri Pole, MD;  Location: [facility];  Service: Endoscopy;  Laterality: N/A;   BREAST BIOPSY Right 2009   BIOPSY, pt denies   BREAST BIOPSY Left 2003   Benign    CATARACT EXTRACTION Left    CESAREAN SECTION     COLONOSCOPY     ESOPHAGEAL MANOMETRY N/A 02/21/2018   Procedure: ESOPHAGEAL MANOMETRY (EM);  Surgeon: Mauri Pole, MD;  Location: [facility];  Service: Endoscopy;  Laterality: N/A;   FOOT FRACTURE SURGERY  2006 or 2007   right   HYMENECTOMY  LUNG BIOPSY  09/28/2012   Procedure: LUNG BIOPSY;  Surgeon: Melrose Nakayama, MD;  Location: [facility];  Service: Thoracic;  Laterality: N/A;  lung biopsies tims three   SQUAMOUS CELL CARCINOMA EXCISION Left    left arm   TOTAL HIP ARTHROPLASTY Right 09/10/2015   Procedure: RIGHT TOTAL HIP ARTHROPLASTY ANTERIOR APPROACH;  Surgeon: Paralee Cancel, MD;  Location: [facility];  Service: Orthopedics;  Laterality: Right;   TOTAL HIP ARTHROPLASTY Left 10/03/2019   Procedure: TOTAL HIP ARTHROPLASTY ANTERIOR APPROACH;  Surgeon: Paralee Cancel, MD;  Location: [facility];  Service: Orthopedics;  Laterality: Left;  70 mins   TUBAL LIGATION     UPPER GI ENDOSCOPY     VIDEO ASSISTED THORACOSCOPY  09/28/2012   Procedure: VIDEO ASSISTED THORACOSCOPY;  Surgeon: Melrose Nakayama, MD;  Location: [facility];  Service: Thoracic;  Laterality: Right;   VIDEO BRONCHOSCOPY  11/19/2011   Procedure: VIDEO BRONCHOSCOPY WITH FLUORO;  Surgeon: Brand Males, MD;  Location: [facility];  Service: Endoscopy;;    Allergies  Allergen Reactions   Atorvastatin Other (See Comments)    Leg pain   Betadine [Povidone Iodine] Other (See Comments)    blisters   Codeine Nausea And Vomiting   Garlic Diarrhea   Hydrocodone Nausea And Vomiting   Macrobid [Nitrofurantoin] Other (See Comments)   Ofev [Nintedanib] Other (See Comments)    Abdominal pain   Onion Diarrhea   Other Other (See  Comments)   Rosuvastatin Other (See Comments)    Leg pain   Shellfish Allergy Nausea And Vomiting   Sulfa Antibiotics    Sulfonamide Derivatives Other (See Comments)    headaches    Immunization History  Administered Date(s) Administered   Fluad Quad(high Dose 65+) 07/17/2019   Hepatitis A 05/23/2010   Hepatitis B 06/23/2006   Influenza Split 08/11/2012, 08/14/2017   Influenza Whole 09/08/2011   Influenza, High Dose Seasonal PF 08/24/2016, 07/24/2017, 08/31/2018, 08/15/2020, 09/26/2020   Influenza,inj,Quad PF,6+ Mos 09/07/2013   Influenza-Unspecified 09/26/2014, 07/27/2015   MMR 05/23/2010   PFIZER(Purple Top)SARS-COV-2 Vaccination 12/16/2019, 01/08/2020, 07/18/2020, 09/26/2020, 08/06/2021   Pneumococcal Conjugate-13 03/15/2014   Pneumococcal Polysaccharide-23 01/11/2018, 09/26/2020   Td 02/22/2003   Tdap 10/28/2012   Zoster Recombinat (Shingrix) 02/15/2017, 05/17/2017   Zoster, Live 12/04/2010    Family History  Problem Relation Age of Onset   Emphysema Maternal Grandmother    Asthma Maternal Grandmother    Asthma Mother    Osteoarthritis Mother    Dementia Mother 40   Lymphoma Father    Diabetes Father    Hypertension Sister    Heart disease Sister    Kidney disease Sister    Lung disease Maternal Grandfather    Bone cancer Paternal Grandfather    Allergic rhinitis Daughter    Breast cancer Paternal Aunt    Ovarian cancer Maternal Aunt    Breast cancer Cousin    Colon cancer Neg Hx    Angioedema Neg Hx    Eczema Neg Hx    Immunodeficiency Neg Hx    Urticaria Neg Hx      Current Outpatient Medications:    acetaminophen (TYLENOL) 500 MG tablet, Tylenol Extra Strength, Disp: , Rfl:    albuterol (PROAIR HFA) 108 (90 Base) MCG/ACT inhaler, Inhale 2 puffs into the lungs every 4 (four) hours as needed for wheezing. Or coughing spells.  You may use 2 Puffs 5-10 minutes before exercise., Disp: 1 Inhaler, Rfl: 3   Alpha-D-Galactosidase (BEANO PO), Take 1 tablet by  mouth as  needed (if eating onion or garlic). , Disp: , Rfl:    Ascorbic Acid (VITAMIN C) 1000 MG tablet, Take 1,000 mg by mouth daily., Disp: , Rfl:    azelastine (ASTELIN) 0.1 % nasal spray, Place 1 spray into both nostrils at bedtime., Disp: , Rfl:    CALCIUM CITRATE PO, Take 2 tablets by mouth daily., Disp: , Rfl:    chlorpheniramine (CHLOR-TRIMETON) 4 MG tablet, Take 2 mg by mouth 2 (two) times daily. Takes half tablet twice daily, Disp: , Rfl:    dicyclomine (BENTYL) 10 MG capsule, Take 10 mg by mouth as needed for spasms., Disp: , Rfl:    EPINEPHrine 0.3 mg/0.3 mL IJ SOAJ injection, Inject into the muscle., Disp: , Rfl:    ezetimibe (ZETIA) 10 MG tablet, TAKE 1 TABLET (10 MG TOTAL) BY MOUTH DAILY. PLEASE KEEP UPCOMING APPT FOR FUTURE REFILLS. THANK YOU., Disp: 90 tablet, Rfl: 3   famotidine (PEPCID) 20 MG tablet, TAKE 1 TABLET BY MOUTH EVERY DAY AT NIGHT, Disp: , Rfl:    fluticasone (FLONASE) 50 MCG/ACT nasal spray, SPRAY 2 SPRAYS INTO EACH NOSTRIL EVERY DAY, Disp: 48 mL, Rfl: 1   fluticasone furoate-vilanterol (BREO ELLIPTA) 100-25 MCG/INH AEPB, Inhale 1 puff into the lungs daily., Disp: 180 each, Rfl: 3   gabapentin (NEURONTIN) 100 MG capsule, Take 100 mg by mouth 3 (three) times daily., Disp: , Rfl:    lactase (LACTAID) 3000 units tablet, Take by mouth., Disp: , Rfl:    levocetirizine (XYZAL) 5 MG tablet, Take 5 mg by mouth at bedtime., Disp: , Rfl:    metFORMIN (GLUCOPHAGE-XR) 500 MG 24 hr tablet, Take 500 mg by mouth daily with supper., Disp: , Rfl:    metoprolol succinate (TOPROL-XL) 50 MG 24 hr tablet, Take 1 tablet (50 mg total) by mouth daily., Disp: 90 tablet, Rfl: 3   montelukast (SINGULAIR) 10 MG tablet, TAKE 1 TABLET BY MOUTH EVERYDAY AT BEDTIME, Disp: 90 tablet, Rfl: 2   Multiple Vitamin (MULTIVITAMIN) tablet, Take 1 tablet by mouth daily., Disp: , Rfl:    nystatin-triamcinolone (MYCOLOG II) cream, nystatin-triamcinolone 100,000 unit/g-0.1 % topical cream, Disp: , Rfl:     omeprazole (PRILOSEC) 40 MG capsule, Take 1 capsule by mouth daily., Disp: , Rfl:    pravastatin (PRAVACHOL) 40 MG tablet, Take 1 tablet (40 mg total) by mouth every evening., Disp: 90 tablet, Rfl: 1   Probiotic Product (PROBIOTIC ADVANCED PO), Take 2 capsules by mouth daily. , Disp: , Rfl:    Sodium Chloride, Inhalant, 7 % NEBU, Use per nebulizer once a day to help clear sputum, Disp: 120 mL, Rfl: 3   Vitamin D, Ergocalciferol, (DRISDOL) 1.25 MG (50000 UNIT) CAPS capsule, Take 50,000 Units by mouth every 7 (seven) days., Disp: , Rfl:    nitroGLYCERIN (NITROSTAT) 0.4 MG SL tablet, Place 1 tablet (0.4 mg total) under the tongue every 5 (five) minutes as needed for chest pain., Disp: 25 tablet, Rfl: 12      Objective:   Vitals:   08/14/21 1434  BP: 128/62  Pulse: 70  Temp: 98.2 F (36.8 C)  TempSrc: Oral  SpO2: 94%  Weight: 149 lb (67.6 kg)  Height: '4\' 9"'  (1.448 m)    Estimated body mass index is 32.24 kg/m as calculated from the following:   Height as of this encounter: '4\' 9"'  (1.448 m).   Weight as of this encounter: 149 lb (67.6 kg).  '@WEIGHTCHANGE' @  [employer]   08/14/21 1434  Weight: 149 lb (67.6 kg)  Physical Exam General: No distress. Looks well Neuro: Alert and Oriented x 3. GCS 15. Speech normal Psych: Pleasant Resp:  Barrel Chest - no.  Wheeze - Some bilaterally upper lobes, Crackles -upper lobes and lower lobes, No overt respiratory distress CVS: Normal heart sounds. Murmurs - no Ext: Stigmata of Connective Tissue Disease - no HEENT: Normal upper airway. PEERL +. No post nasal drip        Assessment:       ICD-10-CM   1. ILD (interstitial lung disease) ([place])  J84.9     2. 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 oil pain and ? mold in house  J67.9     3. Medication monitoring encounter  Z51.81          Plan:     Patient Instructions   Glad you are better from recent bronchtiis/flare up and glad prednisone ad zpak helped But  noted you are still off  your may 2022 subjective baseline and walk tests Is not better  - really concerned about disease progression Not sure why your PFT was not scheduled today Glad your GERD is better afrer seeing Dr Blenda Nicely - making rechallenge with esbriet anoption for future   Plan Increase prednisone from 15m daily to 144mdaily dose Get CURIMIO backpack for o2 Use o2 for exertion goal > 88% Avoid mountains and mold Do simple walking desaturation today on room air 08/14/2021 Sent your calendar invite for #[email or web address] event tonight 7[place]30 [place] [facility] [facility] [place]   Followup 4 weeks do spirometry and dlco Return to see Dr RaChase Callern 4 weeks for 30 min face to face  --Symptom score and walking desaturation test on room air at follow-up  = Depending on the course we might have to discuss restarting antifibrotic's esbreit as first choice versus adding CellCept to prevent progression of your interstitial lung disease    SIGNATURE    Dr. MuBrand MalesM.D., F.C.C.P,  Pulmonary and Critical Care Medicine Staff Physician, [place] - Interstitial Lung Disease  Program  Pulmonary [id number]: [phone] no answer or between  15:00h - 7:00h: call [phone] Telephone: [phone]  3:28 PM 08/14/2021

## 2021-08-14 NOTE — Patient Instructions (Addendum)
  Glad you are better from recent bronchtiis/flare up and glad prednisone ad zpak helped But noted you are still off  your may 2022 subjective baseline and walk tests Is not better  - really concerned about disease progression Not sure why your PFT was not scheduled today Glad your GERD is better afrer seeing Dr Blenda Nicely - making rechallenge with esbriet anoption for future   Plan Increase prednisone from 5mg  daily to 10mg  daily dose Get CURIMIO backpack for o2 Use o2 for exertion goal > 88% Avoid mountains and mold Do simple walking desaturation today on room air 08/14/2021 Sent your calendar invite for #[email or web address] event tonight 7:30 PM at [employer] parking ramp locked downtown   Followup 4 weeks do spirometry and dlco Return to see Dr Chase Caller in 4 weeks for 30 min face to face  --Symptom score and walking desaturation test on room air at follow-up  = Depending on the course we might have to discuss restarting antifibrotic's esbreit as first choice versus adding CellCept to prevent progression of your interstitial lung disease

## 2021-08-15 DIAGNOSIS — H9311 Tinnitus, right ear: Secondary | ICD-10-CM | POA: Diagnosis not present

## 2021-08-15 DIAGNOSIS — J8410 Pulmonary fibrosis, unspecified: Secondary | ICD-10-CM | POA: Diagnosis not present

## 2021-08-15 DIAGNOSIS — R490 Dysphonia: Secondary | ICD-10-CM | POA: Diagnosis not present

## 2021-08-15 DIAGNOSIS — R053 Chronic cough: Secondary | ICD-10-CM | POA: Diagnosis not present

## 2021-08-15 DIAGNOSIS — H9190 Unspecified hearing loss, unspecified ear: Secondary | ICD-10-CM | POA: Diagnosis not present

## 2021-08-15 DIAGNOSIS — H903 Sensorineural hearing loss, bilateral: Secondary | ICD-10-CM | POA: Diagnosis not present

## 2021-08-22 DIAGNOSIS — J8410 Pulmonary fibrosis, unspecified: Secondary | ICD-10-CM | POA: Diagnosis not present

## 2021-08-22 DIAGNOSIS — I471 Supraventricular tachycardia: Secondary | ICD-10-CM | POA: Diagnosis not present

## 2021-08-22 DIAGNOSIS — D849 Immunodeficiency, unspecified: Secondary | ICD-10-CM | POA: Diagnosis not present

## 2021-08-22 DIAGNOSIS — E785 Hyperlipidemia, unspecified: Secondary | ICD-10-CM | POA: Diagnosis not present

## 2021-08-26 DIAGNOSIS — J3081 Allergic rhinitis due to animal (cat) (dog) hair and dander: Secondary | ICD-10-CM | POA: Diagnosis not present

## 2021-08-26 DIAGNOSIS — J4540 Moderate persistent asthma, uncomplicated: Secondary | ICD-10-CM | POA: Diagnosis not present

## 2021-08-26 DIAGNOSIS — J3089 Other allergic rhinitis: Secondary | ICD-10-CM | POA: Diagnosis not present

## 2021-08-26 DIAGNOSIS — J301 Allergic rhinitis due to pollen: Secondary | ICD-10-CM | POA: Diagnosis not present

## 2021-09-08 NOTE — Telephone Encounter (Signed)
Called and spoke with pt about her mychart message. Pt said that she has also sent MR a message about her symptoms too and stated that she believes he might be getting this taken care of as we speak. Stated to her if she had not heard from pharmacy by tomorrow 10/18 about meds being sent in to call/message [place] back and she verbalized understanding. Closing encounter.

## 2021-09-17 DIAGNOSIS — L814 Other melanin hyperpigmentation: Secondary | ICD-10-CM | POA: Diagnosis not present

## 2021-09-17 DIAGNOSIS — Z85828 Personal history of other malignant neoplasm of skin: Secondary | ICD-10-CM | POA: Diagnosis not present

## 2021-09-17 DIAGNOSIS — L578 Other skin changes due to chronic exposure to nonionizing radiation: Secondary | ICD-10-CM | POA: Diagnosis not present

## 2021-09-17 DIAGNOSIS — B079 Viral wart, unspecified: Secondary | ICD-10-CM | POA: Diagnosis not present

## 2021-09-17 DIAGNOSIS — L821 Other seborrheic keratosis: Secondary | ICD-10-CM | POA: Diagnosis not present

## 2021-09-17 DIAGNOSIS — D225 Melanocytic nevi of trunk: Secondary | ICD-10-CM | POA: Diagnosis not present

## 2021-09-17 DIAGNOSIS — L600 Ingrowing nail: Secondary | ICD-10-CM | POA: Diagnosis not present

## 2021-09-19 DIAGNOSIS — E785 Hyperlipidemia, unspecified: Secondary | ICD-10-CM | POA: Diagnosis not present

## 2021-09-19 DIAGNOSIS — E559 Vitamin D deficiency, unspecified: Secondary | ICD-10-CM | POA: Diagnosis not present

## 2021-09-19 DIAGNOSIS — I272 Pulmonary hypertension, unspecified: Secondary | ICD-10-CM | POA: Diagnosis not present

## 2021-09-19 DIAGNOSIS — I471 Supraventricular tachycardia: Secondary | ICD-10-CM | POA: Diagnosis not present

## 2021-09-19 DIAGNOSIS — K580 Irritable bowel syndrome with diarrhea: Secondary | ICD-10-CM | POA: Diagnosis not present

## 2021-09-19 DIAGNOSIS — R7301 Impaired fasting glucose: Secondary | ICD-10-CM | POA: Diagnosis not present

## 2021-09-19 DIAGNOSIS — D849 Immunodeficiency, unspecified: Secondary | ICD-10-CM | POA: Diagnosis not present

## 2021-09-19 DIAGNOSIS — K219 Gastro-esophageal reflux disease without esophagitis: Secondary | ICD-10-CM | POA: Diagnosis not present

## 2021-09-19 DIAGNOSIS — J8410 Pulmonary fibrosis, unspecified: Secondary | ICD-10-CM | POA: Diagnosis not present

## 2021-09-24 MED ORDER — METOPROLOL SUCCINATE ER 50 MG PO TB24: 75.0000 mg | ORAL_TABLET | Freq: Every day | ORAL | 3 refills | Status: DC

## 2021-09-30 DIAGNOSIS — K580 Irritable bowel syndrome with diarrhea: Secondary | ICD-10-CM | POA: Diagnosis not present

## 2021-09-30 DIAGNOSIS — J8410 Pulmonary fibrosis, unspecified: Secondary | ICD-10-CM | POA: Diagnosis not present

## 2021-09-30 DIAGNOSIS — K219 Gastro-esophageal reflux disease without esophagitis: Secondary | ICD-10-CM | POA: Diagnosis not present

## 2021-09-30 DIAGNOSIS — I471 Supraventricular tachycardia: Secondary | ICD-10-CM | POA: Diagnosis not present

## 2021-10-03 ENCOUNTER — Ambulatory Visit (INDEPENDENT_AMBULATORY_CARE_PROVIDER_SITE_OTHER): Payer: Medicare Other | Admitting: Internal Medicine

## 2021-10-03 ENCOUNTER — Encounter: Payer: Self-pay | Admitting: Internal Medicine

## 2021-10-03 ENCOUNTER — Other Ambulatory Visit: Payer: Self-pay

## 2021-10-03 VITALS — BP 120/78 | HR 66 | Temp 98.8°F | Ht 59.0 in | Wt 148.8 lb

## 2021-10-03 DIAGNOSIS — J9611 Chronic respiratory failure with hypoxia: Secondary | ICD-10-CM

## 2021-10-03 DIAGNOSIS — J679 Hypersensitivity pneumonitis due to unspecified organic dust: Secondary | ICD-10-CM | POA: Diagnosis not present

## 2021-10-03 DIAGNOSIS — K449 Diaphragmatic hernia without obstruction or gangrene: Secondary | ICD-10-CM | POA: Diagnosis not present

## 2021-10-03 DIAGNOSIS — R0602 Shortness of breath: Secondary | ICD-10-CM | POA: Diagnosis not present

## 2021-10-03 DIAGNOSIS — J849 Interstitial pulmonary disease, unspecified: Secondary | ICD-10-CM | POA: Diagnosis not present

## 2021-10-03 DIAGNOSIS — K219 Gastro-esophageal reflux disease without esophagitis: Secondary | ICD-10-CM | POA: Diagnosis not present

## 2021-10-03 LAB — PULMONARY FUNCTION TEST
DL/VA % pred: 78 %
DL/VA: 3.35 ml/min/mmHg/L
DLCO cor % pred: 55 %
DLCO cor: 8.92 ml/min/mmHg
DLCO unc % pred: 55 %
DLCO unc: 8.92 ml/min/mmHg
FEF 25-75 Pre: 2.42 L/sec
FEF2575-%Pred-Pre: 158 %
FEV1-%Pred-Pre: 78 %
FEV1-Pre: 1.37 L
FEV1FVC-%Pred-Pre: 122 %
FEV6-%Pred-Pre: 66 %
FEV6-Pre: 1.48 L
FEV6FVC-%Pred-Pre: 105 %
FVC-%Pred-Pre: 63 %
FVC-Pre: 1.48 L
Pre FEV1/FVC ratio: 92 %
Pre FEV6/FVC Ratio: 100 %

## 2021-10-03 NOTE — Progress Notes (Signed)
#GE reflux with small hiatal hernia  - on ppi   #History of rapid heart rate not otherwise specified  - Start her on Lopressor  2008/2009 by primary care physician. History appears to correlate with onset of respiratory issues  - refuses to dc this drug as of 2014 discussion   # Hypersensitivity Pneumonitis and ILD  - Potential etiologies - cockateil x 2 for 18 years through end 2012. 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 Denies mold but lives in house built in [place] with a "weird baselment: and has humidifier  - first noted on CT chest 10/15/09 following trip to [place] (PE negative) but not described in 2003 CT chest report  - autoimmune panel: 10/23/11: Negative  -  Uderwennt bronch 11/19/11  - non diagnostic  - VATS Nov 2013 (done after initially refusing)- Ivalee. However, there is worrying trend of UIP pattern in the Upper lobes.         OV 06/17/2017  Chief Complaint  Patient presents with   Follow-up    [name]. Pt denies significant cough, denies CP/tightness, f/c/s.     She is better. Feels she does not need o2. Has been to [facility] for transplant clinic; felt too early. Seems higher dose prednisone helping but then she is also bettter t his time of the year. In May 2018 I was at [facility] and curbsided national thought leaders - feel that we could explore silent active GERD as a possibility of ILD getting wors with time.   OV 01/11/2018  Chief Complaint  Patient presents with   Follow-up    PFT done today.  Pt states she has been coughing x2 weeks since she returned from a trip to [place]. From coughing, pt has had pain in ribs. Pt states she has mild SOB which is stable due to rehab.  Pt states that Dr. Darron Doom is wanting to know if pt could possibly have eosinophilic esophagitis.   Went to [place]. Returned and has been  coughing x 2 weeks. Associated with this is some rib pain on right side. She feels she does not need o2. She feels she does not need pred/abx at this time. She also exposed to flu wit family   OV 02/08/2018  Chief Complaint  Patient presents with   Acute Visit    CXR done 02/07/18.  Pt states her throat is sore but not as bad as it was yesterday when in office and does have some left ear pain. Pt states she has some pain on right rib cage and has increased fatigue.     Rhonda [name] presents acutely.  I last saw her one month ago gave her Tamiflu for prophylaxis after flu exposure.  At that point in time she was already reducing her chronic stable prednisone of 10 mg for interstitial lung disease/hypersensitivity pneumonitis.  She had tapered herself slowly 3 or 4 weeks ago to 5 mg.  She says she was doing fairly okay but in the last 2 or 3 weeks she has had increased fatigue.  She says she goes daily swimming and then feels energized but then an hour later starts having fatigue which is more than usual.  Also in the daytime she has random fatigue.  In terms of her respiratory symptoms these are stable and in fact slightly better in terms of shortness of  breath and cough but she is having sore throat without any fever and some left-sided otalgia.  In addition she is having right-sided chest pains that are more than usual.  The chest pains have been reported before.  These are specifically at localized spots along the previous incision several years ago for interstitial lung disease.  They are tender as well to touch.  This pain is worse.  In addition she is also being bothered by arthralgia in her hand and left hip.  The left hip arthralgias old with a hand arthralgias new.  She is worried about pneumothorax recurrence and so she had a chest x-ray yesterday that shows no pneumothorax.  She has stable interstitial lung disease changes but in my personal visualization these interstitial lung disease changes  are worse compared to several years ago.  We do know that she has slowly worsening interstitial lung disease.  In terms of a walking desaturation test this shows stability since last visit.  She has seen GI for acid reflux and has pH probe study coming up  In talking to her she tells me she has been on metformin for over a month or 2.  This is new and is meant for pre-diabetes.  Apparently her hemoglobin A1c is always below 6 but is above 5.5.  Walking desaturation test on 02/08/2018 185 feet x 3 laps on ROOM AIR:  did walk normal pace with forehead probe desaturate. Rest pulse ox was 99%, final pulse ox was 93%. HR response 72/min at rest to 93/min at peak exertion. [name]  Did not Desaturate < 88% . Rhonda [name] yes did  Desaturated </= 3% points. Rhonda [name] yes did get tachyardic   Dg Chest 2 View  Result Date: 02/07/2018 CLINICAL DATA:  Chest wall pain for 2 weeks EXAM: CHEST - 2 VIEW COMPARISON:  CT 08/20/2017, radiograph 10/11/2015 FINDINGS: Coarse interstitial pattern consistent with pulmonary fibrosis, similar distribution compared to prior. No focal pulmonary opacity or pleural effusion. Stable cardiomediastinal silhouette with aortic atherosclerosis. No pneumothorax. Degenerative changes of the spine. IMPRESSION: No active cardiopulmonary disease. Similar appearance of diffuse pulmonary fibrosis. Negative for a pneumothorax. Electronically Signed   By: Donavan Foil M.D.   On: 02/07/2018 14:26    OV 04/13/2018  Chief Complaint  Patient presents with   Follow-up    Pt had pre spiro and DLCO PFT prior to OV. Pt has increase of productive cough-thick white in last week. Pt has some wheezing, and allergy issues.   Rhonda [name] presents for routine follow-up.  I saw her acutely just approximately 2 months ago.  Then end of April 2019 she saw a nurse practitioner again acutely and was given antibiotic and prednisone.  She says she felt better after that but in the  last week or so due to increased pollen load in the community and also moving furniture because her daughter is relocating out of her home she started having more cough.  There is no change in her dyspnea with the cough is really bad.  This is despite Singulair and Chlor-Trimeton.  Those things to help but just take the edge off.  She feels that the pollen making the cough worse.  There is no change in dyspnea.  Walking desaturation test documented below his baseline.  She had pulmonary function test today but the [facility] shows significant decline compared to February 2019 and she feels surprised by this.  DLCO is baseline unchanged and a walking desaturation test  limited in the office is also unchanged.  She does not have any fever but has significant postnasal drip.  She has not followed up at [facility] transplant clinic or the allergist recently.  OV 08/16/2018  Subjective:  Patient ID: Rhonda [name], female , DOB: [birth year] , age 41 y.o. , MRN: [id number] , AD[address]ESS: [address] [address]   08/16/2018 -   Chief Complaint  Patient presents with   Follow-up    PFT performed today.  Pt states she has had a lot of mucus the month of September 2019 so she has been taking the Chlor-Trimeton. States other than that she has been able to do a lot more activities and has been swimming a lot more. Pt states she believes the SOB is stable.     HPI KENYADA DOSCH 70 y.o. -continues to do well.  She is taking higher dose of prednisone 10 mg/day.  Also the summer usually she is doing well.  She is not having much of a cough.  Lung function test shows improvement compared to tests done earlier this year.  In fact she is almost as good as much 2018.  Still compared to 2014-2016 she has progressive lung disease.  Walking desaturation test is stable.  She needs a high-dose flu shot but we do not have stock today.  She has lost some weight intentionally with better dietary control.  She is  exercising regularly swimming.  We discussed ofev  potential future therapy if the drug is approved for this indication.  We will know study results in a year or so.  She is open to the idea but is worried somewhat about her irritable bowel syndrome flaring up.  She is no longer following at the [facility] lung transplant clinic given stability lung function  Other issues: She always has right-sided chest wall pain to the site of lung biopsy.  The only way this is been resolved as by her limiting her use of bra .  Also her irritable bowel syndrome is under control but in case it flares up she wants a refill of dicyclomine     OV 12/15/2018  Subjective:  Patient ID: Rhonda [name], female , DOB: November 22, 1951 , age 46 y.o. , MRN: [id number] , AD[address]ESS: [address] [place] [address]   12/15/2018 -   Chief Complaint  Patient presents with   Follow-up    Pt states she has been doing better since last visit with TP but states she has been having pain in her rib cage x2 years but states it has become more intense.     HPI Rhonda [name] 70 y.o. -returns for follow-up of her ILD due to hypersensitivity pneumonitis.  She did a clinic visit also for research with the ILD-pro registry study.  Since I last saw her in September 2019 she had a flareup and required higher doses of prednisone.  Since then she is returned to baseline of 5 mg prednisone and she feels good.  She is stable.  Walking desaturation test shows stability.  We have been discussing over the last few months about taking nintedanib based on new data and progressive non--IPF ILD's.  She has trepidation for this because of irritable bowel syndrome.  After much discussion she is willing to try 100 mg once daily for a month and then escalate to 100 mg twice daily which is the therapeutic dose.  She will do this once after insurance approval which we  suspect will happen over the next few months.  Her main issue is that her right  sided infra-axillary chest pain is getting worse.  This started after her surgical lung biopsy and pneumothorax on the right side.  It is random and happens with twisting.  But now days it happens once every few days.  Previously it used to happen once every few weeks.  Sometimes it is excruciating but then she is left in pain for a few days.  Applying Abrol sudden twisting of her body makes it worse.  Is a lancinating pain.  She did try gabapentin some years ago for chronic cough .  She did tolerate the gabapentin well.  She does not remember if it helped the pain or not but clearly back and the pain was much less intense.  She is willing to try this again.  I recommended a CT scan of the chest but she is going to have radiation for a CT coronary angiogram because of high levels of coronary artery calcification.  Therefore written to the radiologist to see if he can look at the lung parenchyma with a CT angiogram    OV 05/09/2019  Subjective:  Patient ID: Rhonda [name], female , DOB: 08/10/1951 , age 31 y.o. , MRN: [id number] , AD[address]ESS: [address] [place] [address]   05/09/2019 -   Chief Complaint  Patient presents with   ILD (Interstitial Lung Disease)     HPI Rhonda [name] 70 y.o. -follow-up for chronic care physician pneumonitis on daily 5 mg prednisone.  She has problems with right-sided chest wall pain that is neuropathic.  She tells me since her last visit January 2020 she has been isolating and social distancing because of the pandemic.  Overall she is been stable.  Her symptom scores are mild and documented below.  She continues with prednisone.  She was supposed to have pulmonary function test but this was held because of the pandemic.  Her walking desaturation test shows she is stable.  She is supposed to have pulmonary function test tomorrow but wanted to see me today.  In the interim she did have a cardiac CT scan of the chest that shows 94th percentile of coronary calcium.   She could not get a CT angiogram done because of PVC.  A nuclear medicine stress test was done and I reviewed this result and it was normal.  A Holter test is pending.  She is kind of nervous because of the ongoing COVID-19 situation and safety of doing a Holter test.  In terms of her right chest wall pain she is adapted to it.  She does not use any bra  OV 08/22/2019  Subjective:  Patient ID: Rhonda [name], female , DOB: February 25, 1951 , age 47 y.o. , MRN: [id number] , ADDRESS: [address] [place] [address]   08/22/2019 -   Chief Complaint  Patient presents with   Follow-up    needs sx clearance for L hip replacement- not yet scheduled, pending clearance.     Patient lung disease chronic hypersensitive pneumonitis on chronic daily prednisone 5 mg/day  HPI Rhonda [name] 70 y.o. -presents for follow-up of her interstitial lung disease due to chronic hypersensitive pneumonitis.  After last visit in June 2020 given her PFT stability and also her aversion to the potential side effects with nintedanib she decided to just continue with prednisone daily 5 mg/day.  Given the pandemic she is walking 1 mile a day without stopping.  Her interstitial lung disease symptom score is actually slightly better.  Overall she reports stability in her interstitial lung disease.  She has a new issue of getting preoperative clearance for her left hip.  She says the left hip is bone-on-bone and she does not want to take Aleve.  She prefers to have surgery.  Dr. Paralee Cancel is the surgeon.  Surgery date has not been set.  She is very functional.  She has normal renal function.  Normal nutritional status.  No anemia.  No respiratory infections in the last 1 month.  She is not on oxygen.  Age 22.  The surgeries in the left hip and the duration of surgery is under 3 hours.  Anesthesia will be general.      OV 01/30/2020  Subjective:  Patient ID: Rhonda [name], female , DOB: 1951-06-18 , age 3 y.o. , MRN:  [id number] , AD[address]ESS: [address] [address] Patient lung disease chronic hypersensitive pneumonitis on chronic daily prednisone 5 mg/day  01/30/2020 -   Chief Complaint  Patient presents with   Follow-up    PFT performed 3/8.  Pt states she believes she has been going downhill for the past 2 months. Pt was started on O2 at last OV and wears it prn. Pt does have an occ cough.     HPI Rhonda [name] 70 y.o. -presents for follow-up of interstitial lung disease secondary to chronic hypersensitive pneumonitis.  She maintains herself on prednisone 5 mg/day.  In the interim she has had hip surgery and this did wonders for her.  She is able to walk longer distances.  However she does notice that her dyspnea has declined.  She says since January 2021 his symptoms have declined particularly with cough.  Usually in the winter she goes through a cycle of severe cough and exacerbation that requires higher levels of prednisone.  She has been social distancing well and masking well.  She states there is absolutely no mold or water any antigen exposure at home.  But she feels it is her allergies acting up and making her more symptomatic.  She has had a Covid vaccine and is wondering about either going back to swimming or starting pulmonary rehabilitation.  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  In fact when we walked her today with the mask she did drop down to 89%.  This is a decline for her.  She had pulmonary function test and this shows significant decline in FVC and DLCO.  In review of this that.  Where she has declined this much but is always bounce back with steroids.  At last visit we discussed nintedanib for her as a way to prevent progression of her ILD but given her concerns of GI side effects and the presence of irritable bowel syndrome she is declined.  Infective have this conversation with her  including her joining the [employer] at [facility] but she has always been worried about the GI side effects with nintedanib.  This time she is more open to it because of the decline in lung function.  She wants to see an allergist  She also told me that she continues to have a restrictive chest pain particularly on the right side lower area.  She no longer wears a brassiere it has been nearly 3 years since she had a high-resolution CT chest.  She  is open to having one now.  Last liver function test was normal in January 2020.  Last renal function was normal in November 2020.      OV 03/19/2020  Subjective:  Patient ID: Rhonda [name], female , DOB: 1951-01-06 , age 34 y.o. , MRN: [id number] , [address]ESS: [address] [place] [address]  Chronic hypersensitive pneumonitis interstitial lung disease on prednisone daily 10 mg 03/19/2020 -   Chief Complaint  Patient presents with   Follow-up    Pt states she is doing better since last visit and states her breathing has also improved.     HPI Rhonda [name] 70 y.o. -at last visit in March 2021 there was decline in lung function.  Therefore we started nintedanib.  She took 1 tablet of 150 mg and then had immediate abdominal pain and fever.  Therefore she is decided against taking this.  I also support this decision because I do not think she will tolerate nintedanib.  We did extensive interstitial lung disease question a history again at that time.  She had taken 1 dose of nitrofurantoin.  Have indicated to her that she should never take this medicine again.  In addition discovered that she might have some feather jackets at home she has now gotten rid of it.  She is now doing pulmonary rehabilitation.  Her overnight oxygen desaturation test is negative.  At pulmonary rehabilitation she tells me she does not drop below 91% and 1 time she went to 89%.  Otherwise overall doing great.  Her cardiologist is increased her Lopressor.  Her daughter is getting  married and she is busy with the wedding planning this is being emotionally stressful for her.  Her allergist has increased her recommendations and this is also helping her.  Her allergist is retiring and she wanted recommendations for new allergist.  I have recommended Dr. Fredderick Phenix and Dr. Remus Blake.  Incidentally that right-sided chest pain that she had is also better with increased prednisone of 10 mg/day.  She feels prednisone is working really well for her.    OV 08/29/2020   Subjective:  Patient ID: Rhonda [name], female , DOB: 1951/06/05, age 25 y.o. years. , MRN: [id number],  ADDRESS: [address] [address] PCP  Hayden Rasmussen, MD Providers : Treatment Team:  Attending Provider: Brand Males, MD Patient Care Team: Hayden Rasmussen, MD as PCP - General (Family Medicine) Minus Breeding, MD as PCP - Cardiology (Cardiology) Brand Males, MD as Consulting Physician (Pulmonary Disease) Verl Blalock, Marijo Conception, MD (Inactive) (Cardiology) Kennith Center, RD as Dietitian (Family Medicine) Minus Breeding, MD as Consulting Physician (Cardiology)  Follow-up chronic hypersensitive pneumonitis on chronic prednisone 10 mg/day.  Last high-resolution CT chest March 2021.  Intolerant to nintedanib.  Chief Complaint  Patient presents with   Follow-up    Pt states she had been doing well since last visit up until 3-4 weeks ago and started having problems with her breathing and has had to use her O2 with activities. Pt also has been coughing a lot and is getting up clear phlegm. Pt also has had some mild wheeze.       HPI Rhonda [name] 70 y.o. -returns for follow-up.  Since I last saw her her daughter is now married.  She had a great wedding.  She was able to enjoy this and dance quite well.  She barely used her oxygen her effort tolerance was good.  Some 3 weeks [place]  Apparently she had difficulty getting her oxygen tank  to clinic to the electrical port.  She noticed with exertion a pulse ox dropping to the 70s.  She said even before she left a few days prior to that she started having increasing cough.  She notices that these cough cycles get exacerbated in the fall in the winter.  Looking back at her pulmonary function test there is always a dip between October and March each year.  Between 2015 and 2017 there was a decline and since then there is a waxing and waning quality with dips in the winter in the fall season.  Each time she responds to prednisone.  Most recent pulmonary function test in August 2021 is actually improved compared to March 2021.  However today she is feeling worse.  She feels something is in her airway.  She feels dry air in the house and the animals bringing leaves from outside is contributing.  She says she feels better upstairs than downstairs where the dogs.  She again denies any mold or mildew.  Denies any feather jacket exposure.  She feels she will benefit from another round of prednisone.  She feels in the past antibiotics have not helped her as much as prednisone.   In terms of chronic ILD HP: She not tolerate nintedanib in the past.  I explained to her that I am concerned about progression and her decline in functional quality and the risk for that.  We discussed pirfenidone as an alternative.  Discussed the fact is less well studied although have no reason to see why it would not be effective.  Explained the side effect profile.  She is willing to try this.  We decided to go donor samples from the patient support group  Oxygen qualification: We walked her and she qualified for oxygen today.  Is for portable oxygen with [employer]  Other issue: Spring allergies: -Her allergist is retired.  We discussed options of referral.  Referred her to Dr. Hardie Pulley referred to     OV 01/14/2021  Subjective:  Patient ID: Rhonda [name], female , DOB: 03/16/1951 , age 66 y.o. , MRN: [id number] ,  ADDRESS: [address] [address] PCP Hayden Rasmussen, MD Patient Care Team: Hayden Rasmussen, MD as PCP - General (Family Medicine) Minus Breeding, MD as PCP - Cardiology (Cardiology) Brand Males, MD as Consulting Physician (Pulmonary Disease) Verl Blalock, Marijo Conception, MD (Inactive) (Cardiology) Kennith Center, RD as Dietitian (Family Medicine) Minus Breeding, MD as Consulting Physician (Cardiology)  This Provider for this visit: Treatment Team:  Attending Provider: Brand Males, MD    01/14/2021 -   Chief Complaint  Patient presents with   Follow-up    Nauseous, loss of appetite    Follow-up chronic hypersensitive pneumonitis on prednisone 5 mg/day and also pirfenidone submaximal dose of 2 pills 3 times daily since October 2021.  On ILD-pro registry protocol  - failed ofev   - failed esbrit due to side effects Faythe Ghee 2022  She is also on Breo  HPI Rhonda [name] 70 y.o. -returns for follow-up.  She is now on pirfenidone along with low-dose prednisone.  She feels the pirfenidone is not reacting well with her at all.  She has intermittent loss of taste at least twice a week.  She has loss of appetite and she has forces herself to eat.  She also has fatigue.  She says that  if I strongly recommended then she will continue to soldier on with it.  Otherwise she feels less short of breath.  Pulmonary function test reviewed and is actually stable/slightly improved.  She says the cough is extremely well controlled except today after the pulmonary function test she was coughing quite a bit.  She is looking forward to going back to swimming.  She has had a Covid vaccine in booster and also status post EVUSHELD monoclonal antibody prophylaxis.  We discussed the possibility about possibility of enrolling in inhaled nitric oxide device/administration study.  This is to see if people can improve in the shortness of breath scale and also physical activity.  She took the consent form and  is deliberating.  She thinks she might find the study and inconvenience to her lifestyle where she is wanting to be more active.  We went over several details of this research protocol  She has a new symptom of like itching sensation deep in the right flank.  She feels it is not the skin it is from deep inside.  She is wondering if it could be related to pirfenidone.  Started after going on pirfenidone.  Explained to her that I am not seeing this in other patients with pirfenidone but will have to be open minded.  She does not apply Vaseline to her skin in the winter.  We discussed the possibility this could be dry skin related but she does not have other features of dry skin.  Nevertheless she is willing to try Vaseline.   Results for CHAPP   OV 07/08/2021  Subjective:  Patient ID: Rhonda [name], female , DOB: [birth year] , age 82 y.o. , MRN: [id number] , ADDRESS: [address] [place] [address] PCP Hayden Rasmussen, MD Patient Care Team: Hayden Rasmussen, MD as PCP - General (Family Medicine) Minus Breeding, MD as PCP - Cardiology (Cardiology) Brand Males, MD as Consulting Physician (Pulmonary Disease) Verl Blalock, Marijo Conception, MD (Inactive) (Cardiology) Kennith Center, RD as Dietitian (Family Medicine) Minus Breeding, MD as Consulting Physician (Cardiology)  This Provider for this visit: Treatment Team:  Attending Provider: Brand Males, MD    07/08/2021 -  ACUTE VISIT Chief Complaint  Patient presents with   Acute Visit    Pt states that she did start taking zpak yesterday 8/15 and said her cough is doing some better after being on second day of abx and said that she started prednisone today 8/16. States that she still has increased SOB. Pt did take a covid test which came back negative.   Follow-up chronic hypersensitive pneumonitis on prednisone 5 mg/day and also pirfenidone submaximal dose of 2 pills 3 times daily since October 2021.  On ILD-pro registry protocol -  failed ofev   - failed esbrit due to side effects marh 2022  She is also on Breo   HPI Rhonda [name] 70 y.o. -this is an acute visit.  Last seen in February 2022 after that she was supposed to see me back in 3 months.  But this visit has been scheduled acutely.  She tells me that early in May 2022 she went to [facility].  They rented a house.  She believes that the bedroom had some mold.  She did not visually confirmed the mold but husband might have.  She says shortly after going there and spending a week she started having worsening cough with hoarseness of voice.  This cough is persisted all along and the hoarseness has persisted  as well.  The in June 2022 she had a short course of steroids that did not help.  She believes the dosing was not strong enough.  In July 2022 she had some back issues and was given 30 mg of prednisone for approximately 10 days with a taper and this helped her back.  But all along the cough is persisted and severe hoarseness is persisted.  Then approximately a week ago she started getting shortness of breath along with yellow sputum.  The cough got worse .  She states even at baseline she brings her a lot of white sputum and has to cough a lot.  [place] and a couple of days into the illness.  At this point in time she started noticing she was more easily desaturating.  She is not able to swim.  She says walking to the car her pulse ox dropped to 84%.  Then the yellow phlegm started getting worse.  She called in yesterday and we prescribed Z-Pak and a prednisone burst.  She says now with the Z-Pak the sputum color is improving.  A week ago she also saw Dr. Blenda Nicely for chronic cough.  100 mg 3 times daily of gabapentin has been prescribed.  She is slowly increasing it.  She is worried about the side effects of this drug.  Apparently her vocal cord had ulcers from repeated coughing.  ILD symptom score shows worsening.  In the office  she is very hoarse   Of note she stopped her pirfenidone in February/March 2022 because of side effects - gerd  She uses oxygen with exertion.  There is no leg swelling or hemoptysis.  Started z pak yesterday Started 12d pred taper yesterday     PFT   OV 08/14/2021  Subjective:  Patient ID: Rhonda [name], female , DOB: [birth year] , age 22 y.o. , MRN: [id number] , ADDRESS: [address] [place] [address] PCP Hayden Rasmussen, MD Patient Care Team: Hayden Rasmussen, MD as PCP - General (Family Medicine) Minus Breeding, MD as PCP - Cardiology (Cardiology) Brand Males, MD as Consulting Physician (Pulmonary Disease) Verl Blalock, Marijo Conception, MD (Inactive) (Cardiology) Kennith Center, RD as Dietitian (Family Medicine) Minus Breeding, MD as Consulting Physician (Cardiology)  This Provider for this visit: Treatment Team:  Attending Provider: Brand Males, MD  Follow-up chronic hypersensitive pneumonitis on prednisone 5 mg/day and also pirfenidone submaximal dose of 2 pills 3 times daily since October 2021.  On ILD-pro registry protocol - failed ofev   - failed esbrit due to side effects Faythe Ghee 2022  She is also on Breo  08/14/2021 -   Chief Complaint  Patient presents with   Follow-up    Pt states she is feeling better since last visit. States she did receive her lighterweight POC which has been working well for her.     HPI Rhonda [name] 70 y.o. -seen last month with worsening symptoms acute bronchitis/flare.  Given Z-Pak and prednisone.  She is significantly better.  She tells me that even though she is better and his subjective symptom score is better below.  She still feels not back to her May 2022 baseline.  She says in the neighborhood she has to use more oxygen.  She is interested in a backpack for oxygen.  She is not able to swim as well as she used to.  She says acid reflux is under better control now after seeing Dr.  Marcellino in ENT.  She is wondering  about starting pirfenidone she definitely does not want to do nintedanib again.  But even with pirfenidone she is reluctant.  We discussed CellCept because of progression but because of the immunosuppression she is reluctant.  We resolved that she would increase the prednisone to 10 mg/day and reassess.  She was supposed to have done a spirometry today but it did not happen.  She will have it done again in 4 weeks.  Have requested a schedule for that.    The main concern right now even though is better is that she is not to her baseline as of a year ago and she feels like she is having slowly progressive disease interstitial lung disease/chronic HP.       OV 10/03/2021  Subjective:  Patient ID: Rhonda [name], female , DOB: Dec 09, 1950 , age 70 y.o. , MRN: [id number] , AD[address]ESS: [address] [id number] PCP Hayden Rasmussen, MD Patient Care Team: Hayden Rasmussen, MD as PCP - General (Family Medicine) Minus Breeding, MD as PCP - Cardiology (Cardiology) Brand Males, MD as Consulting Physician (Pulmonary Disease) Verl Blalock, Marijo Conception, MD (Inactive) (Cardiology) Kennith Center, RD as Dietitian (Family Medicine) Minus Breeding, MD as Consulting Physician (Cardiology)  This Provider for this visit: Treatment Team:  Attending Provider: Brand Males, MD    10/03/2021 -   Chief Complaint  Patient presents with   Follow-up    Pt states her breathing has become worse since last visit. States she is having to use her O2 almost all the time now with exertion.    Follow-up chronic hypersensitive pneumonitis on prednisone 5 mg/day and also pirfenidone submaximal dose of 2 pills 3 times daily since October 2021.  On ILD-pro registry protocol - failed ofev due to side efect  - failed esbrit due to sde effects marvh 2022  -Last echo 2018  -Last high-res CT March 2021.  She is also on Breo  HPI CHLOEY RICARD 70 y.o. -returns for follow-up.  In this visit she is  categorically saying that she is significantly worse.  This point in particular relationship to dyspnea on exertion.  Her weight itself is stable.  She is currently on 5 mg prednisone per day [last time I thought I increased it to 10 mg/day].  Nevertheless she says other interim changes is that she has had slow weight gain.  BMI is 30.  She started semaglutide for hemoglobin A 1.C 6.5.  Is also to help weight.  She was also after seeing Dr. Blenda Nicely for her cough and significant amount of anti-PPI therapy.  She said this made her diarrhea worse.  She then reduced it and currently is taking omeprazole 20 mg/day in the daytime and 40 mg at night and in between Pepcid as well.  She stopped the gabapentin.  She feels after reducing the PPI the diarrhea is improved.  The cough is not as bad as it was even after stopping gabapentin but she continues to be fatigued.  She complains of bloating.  She feels her stomach is full.  She feels acid reflux is worse.  Acid reflux is worse after reducing the dose of H2 blockade.  She also has early satiety.  She also states when she coughs she vomits.  She feels she needs to see Dr. Ethlyn Gallery.  I have messaged him and he hasagreed for him or his PA to see her soon.  Nevertheless symptoms are significantly worse as  seen on the symptom score below.  She is also reporting increased tachycardia with exertion and apparently Dr. Percival Spanish adjusted her beta-blocker.  She has upcoming appointment with them.    She says that at times when she desaturates easily at home even for minimal exertion.  We walked her today with a forehead probe and she actually did much better than expected going down to 89% only at the end of 3 labs [this is actually an improvement from the recent past versus stability].  She might have an erratic finger pulse ox probe at home.   Her pulmonary function shows a significant decline in DLCO.  Last echocardiogram 2018.  Last high-resolution CT chest March  2021.  Never had right heart catheterization.  Hemoglobin normal 14.7 g% in August 2022.    SYMPTOM SCALE - ILD Sept 2020 01/30/2020  08/29/2020  01/14/2021 146# 07/08/2021 147# - off esbriet. Sic past week 08/14/2021 149# - pred only. No antifibrotic 10/03/2021 148# - pred $Remo'5mg'OSAnS$   O2 use  o2 with ex o2 withe ex 2L     2LL o2 with ex  Shortness of Breath  0 -> 5 scale with 5 being worst (score 6 If unable to do)       At rest 0 0 1 0 1 0 1  Simple tasks - showers, clothes change, eating, shaving 01 1 1 1.$Rem'5 2 1 2  'UDSe$ Household (dishes, doing bed, laundry) 0 $RemoveBe'3 3 3 4 2 'MYOClePoP$ 3.5  Shopping $RemoveB'1 1 1 2 2 1 1  'fzfhbZEP$ Walking level at own pace $Remove'1 2 2 1 2 2 3  'yIvJyzw$ Walking up Stairs $RemoveB'2 3 3 3 4 3 4  'NszKnjrK$ Total (30-36) Dyspnea Score $RemoveBefor'5 10 11 'tvGEhUiYMfMM$ 10.$RemoveBeforeD'5 15 9 'NqKPNCxSgCCDcz$ 14.5  How bad is your cough? $RemoveBef'1 3 2 2 2 2 2  'tZiiFZbkeR$ How bad is your fatigue $RemoveBefo'1 2 2 3 2 'GSxWTPHCWBk$ 0 3  How bad is nausea  0 0 3 0 0 1  How bad is vomiting?   0 0 0 0 0 1  How bad is diarrhea?  00 1 0 0 0 1  How bad is anxiety?  0 0 0 00 0 2  How bad is depression  0 $'2 1 1 'e$ 0 1     Simple office walk 185 feet x  3 laps goal with forehead probe 04/13/2018  08/16/2018  12/15/2018  05/09/2019  08/22/2019  01/30/2020  08/29/2020  01/14/2021  07/08/2021  08/14/2021  10/03/2021   O2 used Room air Room air Room air Room air Room air Room air ra ra ra ra ra  Number laps completed $RemoveBefore'3 3 3 3 2 'ldohkILiqFGgN$ stopped at 2 die to hip pain 3 laps - no hip pain following hip surgery $RemoveBef'3 3 3 'rnzHVrKedB$ attempted byt did  only 2 3 bu stopped at 2 3 and did all 3  Comments about pace good Moderate pace Normal, hip bothering     avg pace avg  avg  Resting Pulse Ox/HR 98% and 73/min 98% and HR 77/min 99% and HR 61/min 98% and HR 70/min 98% 98% and 75/min 97% and 67/mi 94% and 80/min 96% and HR 72 98% and 71 100% and HR 82  Final Pulse Ox/HR 91% and 91/min 93% and 92.min 94% and 92/min 93% and 98/min 91%  89% and 93/,imn 88% and 94 89% ad 88.nin 88% at 2nd  lap end HR 92 86% and 96 89% and HR 91  Desaturated </= 88% no no no no  Desaturated <= 3%  points yes yes Yes, 5 points Yes, 5 points    Yes, 5 points Yes, 8 points Yes, 12 pints Yes 11 pot  Got Tachycardic >/= 90/min yes yes yes yes     yes yes yes  Symptoms at end of test none none Hip pain and very mild dyspnea  Stopped due to hip pain Moderate duyspnea with mask Mild to moderate dyspnea  Severe dyspnea Mild dyspnea Severe dyspnea  Miscellaneous comments x x    No hip pain    Similar to lastime Symptoms out of proportion    CT Chest data  No results found.    PFT  PFT Results Latest Ref Rng & Units 10/03/2021 01/14/2021 11/06/2020 07/18/2020 01/29/2020 05/10/2019 08/16/2018  FVC-Pre L 1.48 1.49 1.41 1.47 1.39 1.62 1.65  FVC-Predicted Pre % 63 62 59 62 57 67 67  FVC-Post L - - - - - - -  FVC-Predicted Post % - - - - - - -  Pre FEV1/FVC % % 92 92 93 88 91 89 90  Post FEV1/FCV % % - - - - - - -  FEV1-Pre L 1.37 1.37 1.31 1.30 1.26 1.45 1.49  FEV1-Predicted Pre % 78 77 73 73 69 80 81  FEV1-Post L - - - - - - -  DLCO uncorrected ml/min/mmHg 8.92 11.26 9.70 11.19 9.36 10.32 9.21  DLCO UNC% % 55 69 59 68 57 63 52  DLCO corrected ml/min/mmHg 8.92 11.26 10.53 11.19 9.36 - -  DLCO COR %Predicted % 55 69 64 68 57 - -  DLVA Predicted % 78 92 89 90 81 93 88  TLC L - - - - - - -  TLC % Predicted % - - - - - - -  RV % Predicted % - - - - - - -    ELL, Kwynn A (MRN [id number]) as of 08/22/2019 11:56  Ref. Range 12/06/2013 13:03 03/15/2014 15:56 09/18/2014 11:41 02/26/2015 09:02 12/16/2015 16:31 04/29/2016 12:44 01/25/2017 10:16 01/11/2018 15:19 04/13/2018 08:47 08/16/2018 09:00 05/10/2019 09:42 3/8?21 07/18/20  FVC-Pre Latest Units: L 1.86 1.91 1.73 1.84 1.42  1.69 1.61 1.35 1.65 1.62 1.39 1.47  FVC-%Pred-Pre Latest Units: % $Remove'69 71 64 72 56 67 65 62 55 67 'ZrdHonx$ 67 57% 62%   Results for Sundell, Elna A (MRN [id number]) as of 08/22/2019 11:56  Ref. Range 12/06/2013 13:03 03/15/2014 15:56 09/18/2014 11:41 02/26/2015 09:02 12/16/2015 16:31 04/29/2016 12:44 01/25/2017 10:16 01/11/2018 15:19 04/13/2018 08:47 08/16/2018  09:00 05/10/2019 09:42 01/29/20 07/18/20  DLCO unc Latest Units: ml/min/mmHg 11.99 11.70 11.53 5.52 10.06 12.12 10.43 10.57 10.16 9.21 10.32 9.36 11.19  DLCO unc % pred Latest Units: % $Remove'63 62 61 31 57 64 55 56 57 52 'RCbqriy$ 63 57% 68%     has a past medical history of Allergy, Arthritis, Asthma, Cataract, Coronary artery calcification seen on CAT scan (08/19/2017), DDD (degenerative disc disease), thoracic, Depression, DOE (dyspnea on exertion), Fatty liver, GERD (gastroesophageal reflux disease), H/O steroid therapy, Heart palpitations (3/0/0923), Helicobacter pylori ab+, Hemorrhoids, Hiatal hernia, High cholesterol, History of chronic bronchitis, History of migraine, History of MRSA infection, Hyperlipidemia, mixed (08/19/2017), Hyperplastic colon polyp (2007), IBS (irritable bowel syndrome), Inguinal hernia, Insulin resistance, Interstitial lung disease ([place]), MVP (mitral valve prolapse), Pneumonia, Pneumonitis, hypersensitivity ([place]), PONV (postoperative nausea and vomiting), Pre-diabetes, Pulmonary fibrosis ([place]), PVC (premature ventricular contraction) (08/19/2017), Rapid heart rate, Squamous cell carcinoma of skin, and Tinnitus, right ear.   reports that she has never smoked. She has never used  smokeless tobacco.  Past Surgical History:  Procedure Laterality Date   31 HOUR [place] STUDY N/A 02/21/2018   Procedure: 24 HOUR PH STUDY;  Surgeon: Mauri Pole, MD;  Location: [facility];  Service: Endoscopy;  Laterality: N/A;   BREAST BIOPSY Right 2009   BIOPSY, pt denies   BREAST BIOPSY Left 2003   Benign    CATARACT EXTRACTION Left    CESAREAN SECTION     COLONOSCOPY     ESOPHAGEAL MANOMETRY N/A 02/21/2018   Procedure: ESOPHAGEAL MANOMETRY (EM);  Surgeon: Mauri Pole, MD;  Location: [facility];  Service: Endoscopy;  Laterality: N/A;   FOOT FRACTURE SURGERY  2006 or 2007   right   HYMENECTOMY     LUNG BIOPSY  09/28/2012   Procedure: LUNG BIOPSY;  Surgeon: Melrose Nakayama, MD;  Location: [facility];  Service: Thoracic;  Laterality: N/A;  lung biopsies tims three   SQUAMOUS CELL CARCINOMA EXCISION Left    left arm   TOTAL HIP ARTHROPLASTY Right 09/10/2015   Procedure: RIGHT TOTAL HIP ARTHROPLASTY ANTERIOR APPROACH;  Surgeon: Paralee Cancel, MD;  Location: [facility];  Service: Orthopedics;  Laterality: Right;   TOTAL HIP ARTHROPLASTY Left 10/03/2019   Procedure: TOTAL HIP ARTHROPLASTY ANTERIOR APPROACH;  Surgeon: Paralee Cancel, MD;  Location: [facility];  Service: Orthopedics;  Laterality: Left;  70 mins   TUBAL LIGATION     UPPER GI ENDOSCOPY     VIDEO ASSISTED THORACOSCOPY  09/28/2012   Procedure: VIDEO ASSISTED THORACOSCOPY;  Surgeon: Melrose Nakayama, MD;  Location: [facility];  Service: Thoracic;  Laterality: Right;   VIDEO BRONCHOSCOPY  11/19/2011   Procedure: VIDEO BRONCHOSCOPY WITH FLUORO;  Surgeon: Brand Males, MD;  Location: [facility];  Service: Endoscopy;;    Allergies  Allergen Reactions   Atorvastatin Other (See Comments)    Leg pain   Betadine [Povidone Iodine] Other (See Comments)    blisters   Codeine Nausea And Vomiting   Garlic Diarrhea   Hydrocodone Nausea And Vomiting   Macrobid [Nitrofurantoin] Other (See Comments)   Ofev [Nintedanib] Other (See Comments)    Abdominal pain   Onion Diarrhea   Other Other (See Comments)   Rosuvastatin Other (See Comments)    Leg pain   Shellfish Allergy Nausea And Vomiting   Sulfa Antibiotics    Sulfonamide Derivatives Other (See Comments)    headaches    Immunization History  Administered Date(s) Administered   Fluad Quad(high Dose 65+) 07/17/2019   Hepatitis A 05/23/2010   Hepatitis B 06/23/2006   Influenza Split 08/11/2012, 08/14/2017   Influenza Whole 09/08/2011   Influenza, High Dose Seasonal PF 08/24/2016, 07/24/2017, 08/31/2018, 08/15/2020, 09/26/2020, 09/12/2021   Influenza,inj,Quad PF,6+ Mos 09/07/2013   Influenza-Unspecified 09/26/2014, 07/27/2015   MMR 05/23/2010   PFIZER(Purple Top)SARS-COV-2  Vaccination 12/16/2019, 01/08/2020, 07/18/2020, 09/26/2020, 08/06/2021   Pneumococcal Conjugate-13 03/15/2014   Pneumococcal Polysaccharide-23 01/11/2018, 09/26/2020, 08/26/2021   Td 02/22/2003   Tdap 10/28/2012   Zoster Recombinat (Shingrix) 02/15/2017, 05/17/2017   Zoster, Live 12/04/2010    Family History  Problem Relation Age of Onset   Emphysema Maternal Grandmother    Asthma Maternal Grandmother    Asthma Mother    Osteoarthritis Mother    Dementia Mother 30   Lymphoma Father    Diabetes Father    Hypertension Sister    Heart disease Sister    Kidney disease Sister    Lung disease Maternal Grandfather    Bone cancer Paternal Grandfather    Allergic rhinitis  Daughter    Breast cancer Paternal Aunt    Ovarian cancer Maternal Aunt    Breast cancer Cousin    Colon cancer Neg Hx    Angioedema Neg Hx    Eczema Neg Hx    Immunodeficiency Neg Hx    Urticaria Neg Hx      Current Outpatient Medications:    acetaminophen (TYLENOL) 500 MG tablet, Tylenol Extra Strength, Disp: , Rfl:    albuterol (PROAIR HFA) 108 (90 Base) MCG/ACT inhaler, Inhale 2 puffs into the lungs every 4 (four) hours as needed for wheezing. Or coughing spells.  You may use 2 Puffs 5-10 minutes before exercise., Disp: 1 Inhaler, Rfl: 3   Alpha-D-Galactosidase (BEANO PO), Take 1 tablet by mouth as needed (if eating onion or garlic). , Disp: , Rfl:    Ascorbic Acid (VITAMIN C) 1000 MG tablet, Take 1,000 mg by mouth daily., Disp: , Rfl:    ASHWAGANDHA PO, Take 3,000 mg by mouth daily., Disp: , Rfl:    azelastine (ASTELIN) 0.1 % nasal spray, Place 1 spray into both nostrils at bedtime., Disp: , Rfl:    CALCIUM CITRATE PO, Take 2 tablets by mouth daily., Disp: , Rfl:    chlorpheniramine (CHLOR-TRIMETON) 4 MG tablet, Take 2 mg by mouth 2 (two) times daily. Takes half tablet twice daily, Disp: , Rfl:    EPINEPHrine 0.3 mg/0.3 mL IJ SOAJ injection, Inject into the muscle., Disp: , Rfl:    ezetimibe (ZETIA) 10 MG  tablet, TAKE 1 TABLET (10 MG TOTAL) BY MOUTH DAILY. PLEASE KEEP UPCOMING APPT FOR FUTURE REFILLS. THANK YOU., Disp: 90 tablet, Rfl: 3   famotidine (PEPCID) 20 MG tablet, TAKE 1 TABLET BY MOUTH EVERY DAY AT NIGHT, Disp: , Rfl:    fluticasone (FLONASE) 50 MCG/ACT nasal spray, SPRAY 2 SPRAYS INTO EACH NOSTRIL EVERY DAY, Disp: 48 mL, Rfl: 1   fluticasone furoate-vilanterol (BREO ELLIPTA) 100-25 MCG/INH AEPB, Inhale 1 puff into the lungs daily., Disp: 180 each, Rfl: 3   lactase (LACTAID) 3000 units tablet, Take by mouth., Disp: , Rfl:    levocetirizine (XYZAL) 5 MG tablet, Take 5 mg by mouth at bedtime., Disp: , Rfl:    metoprolol succinate (TOPROL-XL) 50 MG 24 hr tablet, Take 1.5 tablets (75 mg total) by mouth daily., Disp: 135 tablet, Rfl: 3   montelukast (SINGULAIR) 10 MG tablet, TAKE 1 TABLET BY MOUTH EVERYDAY AT BEDTIME, Disp: 90 tablet, Rfl: 2   Multiple Vitamin (MULTIVITAMIN) tablet, Take 1 tablet by mouth daily., Disp: , Rfl:    nystatin-triamcinolone (MYCOLOG II) cream, nystatin-triamcinolone 100,000 unit/g-0.1 % topical cream, Disp: , Rfl:    omeprazole (PRILOSEC) 40 MG capsule, Take 1 capsule by mouth daily., Disp: , Rfl:    OZEMPIC, 0.25 OR 0.5 MG/DOSE, 2 MG/1.5ML SOPN, Inject into the skin., Disp: , Rfl:    pravastatin (PRAVACHOL) 40 MG tablet, Take 1 tablet (40 mg total) by mouth every evening., Disp: 90 tablet, Rfl: 1   Probiotic Product (PROBIOTIC ADVANCED PO), Take 2 capsules by mouth daily. , Disp: , Rfl:    Sodium Chloride, Inhalant, 7 % NEBU, Use per nebulizer once a day to help clear sputum, Disp: 120 mL, Rfl: 3   TURMERIC PO, Take 1,000 mg by mouth daily., Disp: , Rfl:    Vitamin D, Ergocalciferol, (DRISDOL) 1.25 MG (50000 UNIT) CAPS capsule, Take 50,000 Units by mouth every 7 (seven) days., Disp: , Rfl:    metFORMIN (GLUCOPHAGE-XR) 500 MG 24 hr tablet, Take 500 mg by  mouth daily with supper. (Patient not taking: Reported on 10/03/2021), Disp: , Rfl:    nitroGLYCERIN (NITROSTAT)  0.4 MG SL tablet, Place 1 tablet (0.4 mg total) under the tongue every 5 (five) minutes as needed for chest pain., Disp: 25 tablet, Rfl: 12      Objective:   Vitals:   10/03/21 0953  BP: 120/78  Pulse: 66  Temp: 98.8 F (37.1 C)  TempSrc: Oral  SpO2: 97%  Weight: 148 lb 12.8 oz (67.5 kg)  Height: $Remove'4\' 11"'XsjTYYx$  (1.499 m)    Estimated body mass index is 30.05 kg/m as calculated from the following:   Height as of this encounter: $RemoveBeforeD'4\' 11"'ooFDdmdAlcFZHq$  (1.499 m).   Weight as of this encounter: 148 lb 12.8 oz (67.5 kg).  $Rem'@WEIGHTCHANGE'huzP$ @  [employer]   10/03/21 0953  Weight: 148 lb 12.8 oz (67.5 kg)     Physical Exam    General: No distress.  Looks well.  Mildly overweight. Neuro: Alert and Oriented x 3. GCS 15. Speech normal Psych: Pleasant Resp:  Barrel Chest - NO.  Wheeze - NO, Crackles -bilateral upper lobe crackles, No overt respiratory distress CVS: Normal heart sounds. Murmurs - NO Ext: Stigmata of Connective Tissue Disease - NO HEENT: Normal upper airway. PEERL +. No post nasal drip        Assessment:       ICD-10-CM   1. 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 oil pain and ? mold in house  J67.9     2. ILD (interstitial lung disease) ([place])  J84.9     3. Chronic respiratory failure with hypoxia ([facility])  J96.11     4. Gastroesophageal reflux disease, unspecified whether esophagitis present  K21.9     5. Hiatal hernia  K44.9     6. Interstitial pulmonary disease ([place])  J84.9          Plan:     Patient Instructions     ICD-10-CM   1. 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 oil pain and ? mold in house  J67.9     2. ILD (interstitial lung disease) ([place])  J84.9     3. Chronic respiratory failure with hypoxia (H[facility])  J96.11     4. Gastroesophageal reflux disease, unspecified whether esophagitis present  K21.9     5. Hiatal hernia  K44.9      Quite concerned ILD is worse - either due to denovo progression or gERD mediated Also worried you  might have developed pulmonary hypertension or other cardiac issues Weight gain can also be casuing worsneing shortness of breath Finger pulse ox might be in accurae Too bad ofev +/- esbriet are problematic Currently on prednisone $RemoveBefor'5mg'AhKDBSrvdzuq$  per day  Plan  - get ECHO before you see Dr Percival Spanish   - if evidence of pulmonary hypertension - you will need right heart cath - get HRCT supine and prone  - refer DR Zenovia Jarred of GI    - Have messaged him but  - will also order pH probe study ahead of seeing him - continue weight loss - goal BMI 27   -go for low carb diet - continue o2 for pulse ox goal > 88% - stay on prednisome $RemoveBefor'5mg'LlsOgxrccuiI$  per day   Followup - complete ECHO and CT next 7-10 days  - video visit with Dr Chase Caller < 2 weeks to discuss results and next step  - can schedule in afternoon when Dr Chase Caller is in hospital rotation  - things to discuss  include increasing prednisone, adding cellcept, rechallenge with esbriet, consideration of right heart cath, and transplant referral    ( Level 05 visit: Estb 40-54 min in  visit type: on-site physical face to visit  in total care time and counseling or/and coordination of care by this undersigned MD - Dr Brand Males. This includes one or more of the following on this same day 10/03/2021: pre-charting, chart review, note writing, documentation discussion of test results, diagnostic or treatment recommendations, prognosis, risks and benefits of management options, instructions, education, compliance or risk-factor reduction. It excludes time spent by the [facility] [facility] or office staff in the care of the patient. Actual time 44 min)   SIGNATURE    Dr. Brand Males, M.D., F.C.C.P,  Pulmonary and Critical Care Medicine Staff Physician, [place] Director - Interstitial Lung Disease  Program  [facility] [place], [place], [address]  Pager: [phone], If no answer or between   15:00h - 7:00h: call [phone] Telephone: [phone]  11:07 AM 10/03/2021

## 2021-10-03 NOTE — Patient Instructions (Addendum)
ICD-10-CM   1. 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 oil pain and ? mold in house  J67.9     2. ILD (interstitial lung disease) ([place])  J84.9     3. Chronic respiratory failure with hypoxia (HCC)  J96.11     4. Gastroesophageal reflux disease, unspecified whether esophagitis present  K21.9     5. Hiatal hernia  K44.9      Quite concerned ILD is worse - either due to denovo progression or gERD mediated Also worried you might have developed pulmonary hypertension or other cardiac issues Weight gain can also be casuing worsneing shortness of breath Finger pulse ox might be in accurae Too bad ofev +/- esbriet are problematic Currently on prednisone 5mg  per day  Plan  - get ECHO before you see Dr Percival Spanish   - if evidence of pulmonary hypertension - you will need right heart cath - get HRCT supine and prone  - refer DR Zenovia Jarred of GI    - Have messaged him but  - will also order pH probe study ahead of seeing him - continue weight loss - goal BMI 27   -go for low carb diet - continue o2 for pulse ox goal > 88% - stay on prednisome 5mg  per day   Followup - complete ECHO and CT next 7-10 days  - video visit with Dr Chase Caller < 2 weeks to discuss results and next step  - can schedule in afternoon when Dr Chase Caller is in hospital rotation  - things to discuss include increasing prednisone, adding cellcept, rechallenge with esbriet, consideration of right heart cath, and transplant referral

## 2021-10-03 NOTE — Progress Notes (Signed)
Spirometry and Dlco done today. 

## 2021-10-06 ENCOUNTER — Encounter: Payer: Self-pay | Admitting: Gastroenterology

## 2021-10-06 ENCOUNTER — Ambulatory Visit (INDEPENDENT_AMBULATORY_CARE_PROVIDER_SITE_OTHER): Payer: Medicare Other | Admitting: Gastroenterology

## 2021-10-06 VITALS — BP 110/70 | HR 76 | Ht 59.0 in | Wt 148.0 lb

## 2021-10-06 DIAGNOSIS — J8410 Pulmonary fibrosis, unspecified: Secondary | ICD-10-CM

## 2021-10-06 DIAGNOSIS — K219 Gastro-esophageal reflux disease without esophagitis: Secondary | ICD-10-CM

## 2021-10-06 NOTE — Progress Notes (Signed)
10/06/2021 Rhonda [name] [id number] Mar 30, 1951   HISTORY OF PRESENT ILLNESS: This is a 70 year old female who is a patient of Dr. Vena Rua.  She has not been seen here since 2019.  She follows here for acid reflux related issues.  She has interstitial lung disease/pulmonary fibrosis.  She has been having worsening issues with breathing and they are trying to figure out if it is worsening of her lung disease.  She is scheduled for a CT of the chest.  She has acid reflux and had maxed out her acid reflux regimen with Prilosec 40 mg twice daily and Pepcid at bedtime.  She has having a lot of issues with diarrhea.  Recently decreased to Prilosec 20 mg once daily in the morning and Pepcid at bedtime and her diarrhea has improved.  She really does not feel like there is many changes in regards to her acid reflux symptoms over the last few years.  She was seen here back in 2019 at which time she had an esophageal manometry with pH and impedance as well and those were relatively normal.  She was on PPI therapy at the time of the studies.  Last EGD was in 2015.  She has an appointment for an echo and to see Dr. Percival Spanish cardiology coming up as well.  There is mention of maybe doing a right heart catheter check right heart pressures/pulmonary pressures, etc.  She does use oxygen intermittently, more with exertion.  She reports that she was having a really difficult time with a lot of nausea and generally not feeling well for a period of a few months.  She had been on a lot of prednisone and a couple courses of Zithromax so was not sure if it was coming from all of those medications.  She is feeling better for now, however.   Past Medical History:  Diagnosis Date   Allergy    Arthritis    history spinal stenosis. osteoarthritis right hip   Asthma    Cataract    Coronary artery calcification seen on CAT scan 08/19/2017   >300 on CT scan 08/2017   DDD (degenerative disc disease), thoracic     Depression    DOE (dyspnea on exertion)    a. 04/2010 Lexi MV EF 71%, no ischemia/infarct;     Fatty liver    GERD (gastroesophageal reflux disease)    H/O steroid therapy    Steroid use orally over 4 yrs- for Lung Fibrosis   Heart palpitations 11/94/1740   Helicobacter pylori ab+    Hemorrhoids    Hiatal hernia    High cholesterol    History of chronic bronchitis    as child   History of migraine    History of MRSA infection    Hyperlipidemia, mixed 08/19/2017   Hyperplastic colon polyp 2007   IBS (irritable bowel syndrome)    Inguinal hernia    right   Insulin resistance    past   Interstitial lung disease (HCC)    MVP (mitral valve prolapse)    Posterior mitral valve leaflet with mild MR   Pneumonia    Pneumonitis, hypersensitivity ([place])    a. 09/2012 s/p Bx - ? 2/2 bird, mold, oil paint exposure ->on steroids, followed by pulm.   PONV (postoperative nausea and vomiting)    Pre-diabetes    takes Metformin   Pulmonary fibrosis (HCC)    Dr. Chase Caller follows- stable at present   [place] (premature ventricular contraction) 08/19/2017  Rapid heart rate    Dr. Radford Pax follows- last visit Epic note 9'16   Squamous cell carcinoma of skin    Tinnitus, right ear    Vocal cord ulcer    Past Surgical History:  Procedure Laterality Date   36 HOUR [place] STUDY N/A 02/21/2018   Procedure: 24 HOUR PH STUDY;  Surgeon: Mauri Pole, MD;  Location: [facility];  Service: Endoscopy;  Laterality: N/A;   BREAST BIOPSY Right 2009   BIOPSY, pt denies   BREAST BIOPSY Left 2003   Benign    CATARACT EXTRACTION Left    CESAREAN SECTION     COLONOSCOPY     ESOPHAGEAL MANOMETRY N/A 02/21/2018   Procedure: ESOPHAGEAL MANOMETRY (EM);  Surgeon: Mauri Pole, MD;  Location: [facility];  Service: Endoscopy;  Laterality: N/A;   FOOT FRACTURE SURGERY  2006 or 2007   right   HYMENECTOMY     LUNG BIOPSY  09/28/2012   Procedure: LUNG BIOPSY;  Surgeon: Melrose Nakayama, MD;  Location: [facility];  Service: Thoracic;  Laterality: N/A;  lung biopsies tims three   SQUAMOUS CELL CARCINOMA EXCISION Left    left arm   TOTAL HIP ARTHROPLASTY Right 09/10/2015   Procedure: RIGHT TOTAL HIP ARTHROPLASTY ANTERIOR APPROACH;  Surgeon: Paralee Cancel, MD;  Location: [facility];  Service: Orthopedics;  Laterality: Right;   TOTAL HIP ARTHROPLASTY Left 10/03/2019   Procedure: TOTAL HIP ARTHROPLASTY ANTERIOR APPROACH;  Surgeon: Paralee Cancel, MD;  Location: [facility];  Service: Orthopedics;  Laterality: Left;  70 mins   TUBAL LIGATION     UPPER GI ENDOSCOPY     VIDEO ASSISTED THORACOSCOPY  09/28/2012   Procedure: VIDEO ASSISTED THORACOSCOPY;  Surgeon: Melrose Nakayama, MD;  Location: [facility];  Service: Thoracic;  Laterality: Right;   VIDEO BRONCHOSCOPY  11/19/2011   Procedure: VIDEO BRONCHOSCOPY WITH FLUORO;  Surgeon: Brand Males, MD;  Location: [facility];  Service: Endoscopy;;    reports that she has never smoked. She has never used smokeless tobacco. She reports current alcohol use of about 2.0 standard drinks per week. She reports that she does not use drugs. family history includes Allergic rhinitis in her daughter; Asthma in her maternal grandmother and mother; Bone cancer in her paternal grandfather; Breast cancer in her cousin and paternal aunt; Dementia (age of onset: 63) in her mother; Diabetes in her father; Emphysema in her maternal grandmother; Heart disease in her sister; Hypertension in her sister; Kidney disease in her sister; Lung disease in her maternal grandfather; Lymphoma in her father; Osteoarthritis in her mother; Ovarian cancer in her maternal aunt. Allergies  Allergen Reactions   Atorvastatin Other (See Comments)    Leg pain   Betadine [Povidone Iodine] Other (See Comments)    blisters   Codeine Nausea And Vomiting   Garlic Diarrhea   Hydrocodone Nausea And Vomiting   Macrobid [Nitrofurantoin] Other (See Comments)   Ofev [Nintedanib] Other (See Comments)    Abdominal pain    Onion Diarrhea   Other Other (See Comments)   Rosuvastatin Other (See Comments)    Leg pain   Shellfish Allergy Nausea And Vomiting   Sulfa Antibiotics    Sulfonamide Derivatives Other (See Comments)    headaches      Outpatient Encounter Medications as of 10/06/2021  Medication Sig   acetaminophen (TYLENOL) 500 MG tablet Tylenol Extra Strength   albuterol (PROAIR HFA) 108 (90 Base) MCG/ACT inhaler Inhale 2 puffs into the lungs every 4 (four) hours as  needed for wheezing. Or coughing spells.  You may use 2 Puffs 5-10 minutes before exercise.   Alpha-D-Galactosidase (BEANO PO) Take 1 tablet by mouth as needed (if eating onion or garlic).    Ascorbic Acid (VITAMIN C) 1000 MG tablet Take 1,000 mg by mouth daily.   ASHWAGANDHA PO Take 3,000 mg by mouth daily.   azelastine (ASTELIN) 0.1 % nasal spray Place 1 spray into both nostrils at bedtime.   CALCIUM CITRATE PO Take 2 tablets by mouth daily. Or viactive   chlorpheniramine (CHLOR-TRIMETON) 4 MG tablet Take 2 mg by mouth 2 (two) times daily. Takes half tablet twice daily   dicyclomine (BENTYL) 10 MG capsule Take 1 capsule by mouth daily as needed.   EPINEPHrine 0.3 mg/0.3 mL IJ SOAJ injection Inject into the muscle.   ezetimibe (ZETIA) 10 MG tablet TAKE 1 TABLET (10 MG TOTAL) BY MOUTH DAILY. PLEASE KEEP UPCOMING APPT FOR FUTURE REFILLS. THANK YOU.   famotidine (PEPCID) 20 MG tablet TAKE 1 TABLET BY MOUTH EVERY DAY AT NIGHT   fluticasone (FLONASE) 50 MCG/ACT nasal spray SPRAY 2 SPRAYS INTO EACH NOSTRIL EVERY DAY   fluticasone furoate-vilanterol (BREO ELLIPTA) 100-25 MCG/INH AEPB Inhale 1 puff into the lungs daily.   lactase (LACTAID) 3000 units tablet Take by mouth.   levocetirizine (XYZAL) 5 MG tablet Take 5 mg by mouth at bedtime.   metoprolol succinate (TOPROL-XL) 50 MG 24 hr tablet Take 1.5 tablets (75 mg total) by mouth daily.   montelukast (SINGULAIR) 10 MG tablet TAKE 1 TABLET BY MOUTH EVERYDAY AT BEDTIME   Multiple Vitamin  (MULTIVITAMIN) tablet Take 1 tablet by mouth daily.   nystatin-triamcinolone (MYCOLOG II) cream nystatin-triamcinolone 100,000 unit/g-0.1 % topical cream   omeprazole (PRILOSEC OTC) 20 MG tablet Take 20 mg by mouth daily.   OXYGEN Inhale into the lungs. 2 to 3 liters as needed during the day and nite time use on/off   OZEMPIC, 0.25 OR 0.5 MG/DOSE, 2 MG/1.5ML SOPN Inject into the skin.   pravastatin (PRAVACHOL) 40 MG tablet Take 1 tablet (40 mg total) by mouth every evening.   Probiotic Product (PROBIOTIC ADVANCED PO) Take 1 capsule by mouth daily.   Sodium Chloride, Inhalant, 7 % NEBU Use per nebulizer once a day to help clear sputum   TURMERIC PO Take 1,000 mg by mouth daily.   Vitamin D, Ergocalciferol, (DRISDOL) 1.25 MG (50000 UNIT) CAPS capsule Take 50,000 Units by mouth every 7 (seven) days.   nitroGLYCERIN (NITROSTAT) 0.4 MG SL tablet Place 1 tablet (0.4 mg total) under the tongue every 5 (five) minutes as needed for chest pain.   [DISCONTINUED] metFORMIN (GLUCOPHAGE-XR) 500 MG 24 hr tablet Take 500 mg by mouth daily with supper. (Patient not taking: Reported on 10/03/2021)   [DISCONTINUED] omeprazole (PRILOSEC) 40 MG capsule Take 1 capsule by mouth daily.   No facility-administered encounter medications on file as of 10/06/2021.     REVIEW OF SYSTEMS  : All other systems reviewed and negative except where noted in the History of Present Illness.   PHYSICAL EXAM: BP 110/70   Pulse 76   Ht 4\' 11"  (1.499 m)   Wt 148 lb (67.1 kg)   BMI 29.89 kg/m  General: Well developed white female in no acute distress Head: Normocephalic and atraumatic Eyes:  Sclerae anicteric, conjunctiva pink. Ears: Normal auditory acuity Lungs: Clear throughout to auscultation; no W/R/R. Heart: Regular rate and rhythm; no M/R/G. Abdomen: Soft, non-distended.  BS present.  Non-tender. Musculoskeletal: Symmetrical with no gross  deformities  Skin: No lesions on visible extremities Extremities: No edema   Neurological: Alert oriented x 4, grossly non-focal Psychological:  Alert and cooperative. Normal mood and affect  ASSESSMENT AND PLAN: *70 year old female with interstitial lung disease/pulmonary fibrosis and GERD.  She has had worsening breathing issues and pulmonary trying to determine if it is her interstitial lung disease that is getting worse and if reflux could be causing or contributing to this or if her lungs are stressing her heart and maybe she has some pulmonary hypertension/right heart strain that is causing her worsening breathing issues, etc.  She was on high-dose PPI/H2 blocker therapy until just recently.  She was having issues with diarrhea so has decreased back to omeprazole 20 mg daily in the mornings and Pepcid 20 mg at bedtime.  The diarrhea has been better and overall she feels better since decreasing that.  There was question if she needed esophageal manometry and pH with impedance.  She had both of those studies in 2019 and they were essentially normal at that point.  Her last EGD was in 2015.  Not really sure which route to go with this.  I would like Dr. Vena Rua input.  She does have an ECHO and a cardiology appointment coming up so certainly could wait for the results of those before proceeding with any other evaluation from our standpoint.  Previously her pH with impedance was performed while on PPI therapy.  Question if there be any utility in repeating that off of PPI therapy.  If we were to proceed with another EGD that would need to be done at the hospital due to her intermittent oxygen use.  Esophageal manometry and pH with impedance are not being scheduled until after the first of the year and I believe are scheduling into February at this point anyway.   CC:  Hayden Rasmussen, MD CC: Dr. Chase Caller

## 2021-10-06 NOTE — Patient Instructions (Signed)
Will await Dr. Vena Rua input and get back in touch with you.   If you are age 72 or older, your body mass index should be between 23-30. Your Body mass index is 29.89 kg/m. If this is out of the aforementioned range listed, please consider follow up with your Primary Care Provider.  If you are age 46 or younger, your body mass index should be between 19-25. Your Body mass index is 29.89 kg/m. If this is out of the aformentioned range listed, please consider follow up with your Primary Care Provider.   ________________________________________________________  The [facility] providers would like to encourage you to use [facility] to communicate with providers for non-urgent requests or questions.  Due to long hold times on the telephone, sending your provider a message by [facility] may be a faster and more efficient way to get a response.  Please allow 48 business hours for a response.  Please remember that this is for non-urgent requests.  _______________________________________________________

## 2021-10-07 NOTE — Progress Notes (Signed)
Addendum: Reviewed and agree with assessment and management plan. Pt with IPF and question of whether uncontrolled GERD is contributing to disease progression. Previous pH testing showed GERD controlled with medical therapy and thus I would recommend maximizing medical therapy and do not perform repeat pH testing at this time. Given looser stools associated possibly with PPI, I would recommend we increase famotidine to 40 mg BID (H2 blocker is traditionally not associated with diarrhrea) and continue lower dose omeprazole 20 mg daily (30 min before 1st meal of the day). EGD would be reserved for GERD symptoms or if Dr. Chase Caller wants definitive answer regarding GERD (if so we would perform EGD in outpt hospital setting and plan to biopsy distal esophagus to see if reflux inflammation is seen). Short of Nissen/surgery maximizing medical therapy is the best we can probable accomplish from GERD perspective Thanks JMP  Marquice Uddin, Lajuan Lines, MD

## 2021-10-08 ENCOUNTER — Telehealth: Payer: Self-pay

## 2021-10-08 NOTE — Telephone Encounter (Signed)
Called patient and gave Dr. Vena Rua recommendations for taking the Famotidine and Omeprazole (dosing and timing). She agreed and will keep [place] posted on the results.

## 2021-10-08 NOTE — Telephone Encounter (Signed)
-----   Message from Loralie Champagne, PA-C sent at 10/08/2021 12:46 PM EST ----- Let the patient know that Dr. Hilarie Fredrickson responded to my charting and his response was as follows:  Previous pH testing showed GERD controlled with medical therapy and thus I would recommend maximizing medical therapy and do not perform repeat pH testing at this time. Given looser stools associated possibly with PPI, I would recommend we increase famotidine to 40 mg BID and continue lower dose omeprazole 20 mg daily (30 min before 1st meal of the day). Short of Nissen/surgery, maximizing medical therapy is the best we can probable accomplish from GERD perspective.  Please be sure that she understands to take the pepcid 40 mg BID (sometime mid-day or with dinner and at bedtime).  And the PPI in the morning, 30 mins or so before breakfast.

## 2021-10-10 ENCOUNTER — Ambulatory Visit (HOSPITAL_COMMUNITY)
Admission: RE | Admit: 2021-10-10 | Discharge: 2021-10-10 | Disposition: A | Discharge status: Home / Self Care | Payer: Medicare Other | Source: Ambulatory Visit | Attending: Internal Medicine | Admitting: Internal Medicine

## 2021-10-10 ENCOUNTER — Other Ambulatory Visit: Payer: Self-pay

## 2021-10-10 DIAGNOSIS — J849 Interstitial pulmonary disease, unspecified: Secondary | ICD-10-CM | POA: Insufficient documentation

## 2021-10-10 DIAGNOSIS — J479 Bronchiectasis, uncomplicated: Secondary | ICD-10-CM | POA: Diagnosis not present

## 2021-10-10 DIAGNOSIS — K449 Diaphragmatic hernia without obstruction or gangrene: Secondary | ICD-10-CM | POA: Diagnosis not present

## 2021-10-10 DIAGNOSIS — I2510 Atherosclerotic heart disease of native coronary artery without angina pectoris: Secondary | ICD-10-CM | POA: Diagnosis not present

## 2021-10-14 DIAGNOSIS — E785 Hyperlipidemia, unspecified: Secondary | ICD-10-CM | POA: Diagnosis not present

## 2021-10-14 DIAGNOSIS — R7301 Impaired fasting glucose: Secondary | ICD-10-CM | POA: Diagnosis not present

## 2021-10-14 DIAGNOSIS — K219 Gastro-esophageal reflux disease without esophagitis: Secondary | ICD-10-CM | POA: Diagnosis not present

## 2021-10-14 DIAGNOSIS — J8410 Pulmonary fibrosis, unspecified: Secondary | ICD-10-CM | POA: Diagnosis not present

## 2021-10-15 ENCOUNTER — Ambulatory Visit (HOSPITAL_COMMUNITY)
Admission: RE | Admit: 2021-10-15 | Discharge: 2021-10-15 | Disposition: A | Discharge status: Home / Self Care | Payer: Medicare Other | Source: Ambulatory Visit | Attending: Internal Medicine | Admitting: Internal Medicine

## 2021-10-15 ENCOUNTER — Other Ambulatory Visit: Payer: Self-pay

## 2021-10-15 DIAGNOSIS — E785 Hyperlipidemia, unspecified: Secondary | ICD-10-CM | POA: Diagnosis not present

## 2021-10-15 DIAGNOSIS — R0602 Shortness of breath: Secondary | ICD-10-CM | POA: Insufficient documentation

## 2021-10-15 DIAGNOSIS — I080 Rheumatic disorders of both mitral and aortic valves: Secondary | ICD-10-CM | POA: Diagnosis not present

## 2021-10-15 DIAGNOSIS — J9611 Chronic respiratory failure with hypoxia: Secondary | ICD-10-CM | POA: Insufficient documentation

## 2021-10-15 DIAGNOSIS — I493 Ventricular premature depolarization: Secondary | ICD-10-CM | POA: Diagnosis not present

## 2021-10-15 DIAGNOSIS — J849 Interstitial pulmonary disease, unspecified: Secondary | ICD-10-CM | POA: Insufficient documentation

## 2021-10-15 LAB — ECHOCARDIOGRAM COMPLETE
Area-P 1/2: 2.99 cm2
Calc EF: 52.3 %
S' Lateral: 2.7 cm
Single Plane A2C EF: 53.3 %
Single Plane A4C EF: 52.3 %

## 2021-10-19 NOTE — Progress Notes (Signed)
Minimal progression of ILD March 2021 -> now. Will discuss 10/21/21. Will not call with results

## 2021-10-21 ENCOUNTER — Other Ambulatory Visit: Payer: Self-pay

## 2021-10-21 ENCOUNTER — Encounter: Payer: Self-pay | Admitting: Internal Medicine

## 2021-10-21 ENCOUNTER — Telehealth (INDEPENDENT_AMBULATORY_CARE_PROVIDER_SITE_OTHER): Payer: Medicare Other | Admitting: Internal Medicine

## 2021-10-21 DIAGNOSIS — J849 Interstitial pulmonary disease, unspecified: Secondary | ICD-10-CM

## 2021-10-21 DIAGNOSIS — J679 Hypersensitivity pneumonitis due to unspecified organic dust: Secondary | ICD-10-CM

## 2021-10-21 NOTE — Progress Notes (Signed)
#GE reflux with small hiatal hernia  - on ppi   #History of rapid heart rate not otherwise specified  - Start her on Lopressor  2008/2009 by primary care physician. History appears to correlate with onset of respiratory issues  - refuses to dc this drug as of 2014 discussion   # Hypersensitivity Pneumonitis and ILD  - Potential etiologies - cockateil x 2 for 18 years through end 2012. 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 Denies mold but lives in house built in [place] with a "weird baselment: and has humidifier  - first noted on CT chest 10/15/09 following trip to [facility] (PE negative) but not described in 2003 CT chest report  - autoimmune panel: 10/23/11: Negative  -  Uderwennt bronch 11/19/11  - non diagnostic  - VATS Nov 2013 (done after initially refusing)- [place]. However, there is worrying trend of UIP pattern in the Upper lobes.         OV 06/17/2017  Chief Complaint  Patient presents with   Follow-up    [name]. Pt denies significant cough, denies CP/tightness, f/c/s.     She is better. Feels she does not need o2. Has been to [facility] for transplant clinic; felt too early. Seems higher dose prednisone helping but then she is also bettter t his time of the year. In May 2018 I was at [facility] and curbsided national thought leaders - feel that we could explore silent active GERD as a possibility of ILD getting wors with time.   OV 01/11/2018  Chief Complaint  Patient presents with   Follow-up    PFT done today.  Pt states she has been coughing x2 weeks since she returned from a trip to [place]. From coughing, pt has had pain in ribs. Pt states she has mild SOB which is stable due to rehab.  Pt states that Dr. Darron Doom is wanting to know if pt could possibly have eosinophilic esophagitis.   Went to [place]. Returned and has  been coughing x 2 weeks. Associated with this is some rib pain on right side. She feels she does not need o2. She feels she does not need pred/abx at this time. She also exposed to flu wit family   OV 02/08/2018  Chief Complaint  Patient presents with   Acute Visit    CXR done 02/07/18.  Pt states her throat is sore but not as bad as it was yesterday when in office and does have some left ear pain. Pt states she has some pain on right rib cage and has increased fatigue.     Ms. Brandi presents acutely.  I last saw her one month ago gave her Tamiflu for prophylaxis after flu exposure.  At that point in time she was already reducing her chronic stable prednisone of 10 mg for interstitial lung disease/hypersensitivity pneumonitis.  She had tapered herself slowly 3 or 4 weeks ago to 5 mg.  She says she was doing fairly okay but in the last 2 or 3 weeks she has had increased fatigue.  She says she goes daily swimming and then feels energized but then an hour later starts having fatigue which is more than usual.  Also in the daytime she has random fatigue.  In terms of her respiratory symptoms these are stable and in fact slightly better in terms  of shortness of breath and cough but she is having sore throat without any fever and some left-sided otalgia.  In addition she is having right-sided chest pains that are more than usual.  The chest pains have been reported before.  These are specifically at localized spots along the previous incision several years ago for interstitial lung disease.  They are tender as well to touch.  This pain is worse.  In addition she is also being bothered by arthralgia in her hand and left hip.  The left hip arthralgias old with a hand arthralgias new.  She is worried about pneumothorax recurrence and so she had a chest x-ray yesterday that shows no pneumothorax.  She has stable interstitial lung disease changes but in my personal visualization these interstitial lung disease  changes are worse compared to several years ago.  We do know that she has slowly worsening interstitial lung disease.  In terms of a walking desaturation test this shows stability since last visit.  She has seen GI for acid reflux and has pH probe study coming up  In talking to her she tells me she has been on metformin for over a month or 2.  This is new and is meant for pre-diabetes.  Apparently her hemoglobin A1c is always below 6 but is above 5.5.  Walking desaturation test on 02/08/2018 185 feet x 3 laps on ROOM AIR:  did walk normal pace with forehead probe desaturate. Rest pulse ox was 99%, final pulse ox was 93%. HR response 72/min at rest to 93/min at peak exertion. Patient SHANNYN [name]  Did not Desaturate < 88% . Rhonda [name] yes did  Desaturated </= 3% points. Rhonda [name] yes did get tachyardic   Dg Chest 2 View  Result Date: 02/07/2018 CLINICAL DATA:  Chest wall pain for 2 weeks EXAM: CHEST - 2 VIEW COMPARISON:  CT 08/20/2017, radiograph 10/11/2015 FINDINGS: Coarse interstitial pattern consistent with pulmonary fibrosis, similar distribution compared to prior. No focal pulmonary opacity or pleural effusion. Stable cardiomediastinal silhouette with aortic atherosclerosis. No pneumothorax. Degenerative changes of the spine. IMPRESSION: No active cardiopulmonary disease. Similar appearance of diffuse pulmonary fibrosis. Negative for a pneumothorax. Electronically Signed   By: Donavan Foil M.D.   On: 02/07/2018 14:26    OV 04/13/2018  Chief Complaint  Patient presents with   Follow-up    Pt had pre spiro and DLCO PFT prior to OV. Pt has increase of productive cough-thick white in last week. Pt has some wheezing, and allergy issues.   Ms. Freda Munro presents for routine follow-up.  I saw her acutely just approximately 2 months ago.  Then end of April 2019 she saw a nurse practitioner again acutely and was given antibiotic and prednisone.  She says she felt better after that but  in the last week or so due to increased pollen load in the community and also moving furniture because her daughter is relocating out of her home she started having more cough.  There is no change in her dyspnea with the cough is really bad.  This is despite Singulair and Chlor-Trimeton.  Those things to help but just take the edge off.  She feels that the pollen making the cough worse.  There is no change in dyspnea.  Walking desaturation test documented below his baseline.  She had pulmonary function test today but the [facility] shows significant decline compared to February 2019 and she feels surprised by this.  DLCO is baseline unchanged and a  walking desaturation test limited in the office is also unchanged.  She does not have any fever but has significant postnasal drip.  She has not followed up at [facility] transplant clinic or the allergist recently.  OV 08/16/2018  Subjective:  Patient ID: Rhonda [name], female , DOB: Feb 01, 1951 , age 78 y.o. , MRN: [id number] , AD[address]ESS: [address] [address]   08/16/2018 -   Chief Complaint  Patient presents with   Follow-up    PFT performed today.  Pt states she has had a lot of mucus the month of September 2019 so she has been taking the Chlor-Trimeton. States other than that she has been able to do a lot more activities and has been swimming a lot more. Pt states she believes the SOB is stable.     HPI KERRIE TIMM 70 y.o. -continues to do well.  She is taking higher dose of prednisone 10 mg/day.  Also the summer usually she is doing well.  She is not having much of a cough.  Lung function test shows improvement compared to tests done earlier this year.  In fact she is almost as good as much 2018.  Still compared to 2014-2016 she has progressive lung disease.  Walking desaturation test is stable.  She needs a high-dose flu shot but we do not have stock today.  She has lost some weight intentionally with better dietary control.  She  is exercising regularly swimming.  We discussed ofev  potential future therapy if the drug is approved for this indication.  We will know study results in a year or so.  She is open to the idea but is worried somewhat about her irritable bowel syndrome flaring up.  She is no longer following at the [facility] lung transplant clinic given stability lung function  Other issues: She always has right-sided chest wall pain to the site of lung biopsy.  The only way this is been resolved as by her limiting her use of bra .  Also her irritable bowel syndrome is under control but in case it flares up she wants a refill of dicyclomine     OV 12/15/2018  Subjective:  Patient ID: Rhonda [name], female , DOB: [birth year] , age 59 y.o. , MRN: [id number] , AD[address]ESS: [address] [address]   12/15/2018 -   Chief Complaint  Patient presents with   Follow-up    Pt states she has been doing better since last visit with TP but states she has been having pain in her rib cage x2 years but states it has become more intense.     HPI PANSIE GUGGISBERG 70 y.o. -returns for follow-up of her ILD due to hypersensitivity pneumonitis.  She did a clinic visit also for research with the ILD-pro registry study.  Since I last saw her in September 2019 she had a flareup and required higher doses of prednisone.  Since then she is returned to baseline of 5 mg prednisone and she feels good.  She is stable.  Walking desaturation test shows stability.  We have been discussing over the last few months about taking nintedanib based on new data and progressive non--IPF ILD's.  She has trepidation for this because of irritable bowel syndrome.  After much discussion she is willing to try 100 mg once daily for a month and then escalate to 100 mg twice daily which is the therapeutic dose.  She will do this once after insurance  approval which we suspect will happen over the next few months.  Her main issue is that her right  sided infra-axillary chest pain is getting worse.  This started after her surgical lung biopsy and pneumothorax on the right side.  It is random and happens with twisting.  But now days it happens once every few days.  Previously it used to happen once every few weeks.  Sometimes it is excruciating but then she is left in pain for a few days.  Applying Abrol sudden twisting of her body makes it worse.  Is a lancinating pain.  She did try gabapentin some years ago for chronic cough .  She did tolerate the gabapentin well.  She does not remember if it helped the pain or not but clearly back and the pain was much less intense.  She is willing to try this again.  I recommended a CT scan of the chest but she is going to have radiation for a CT coronary angiogram because of high levels of coronary artery calcification.  Therefore written to the radiologist to see if he can look at the lung parenchyma with a CT angiogram    OV 05/09/2019  Subjective:  Patient ID: Rhonda [name], female , DOB: August 29, 1951 , age 36 y.o. , MRN: [id number] , AD[address]ESS: [address] [place] [address]   05/09/2019 -   Chief Complaint  Patient presents with   ILD (Interstitial Lung Disease)     HPI MALAYZIA LAFORTE 70 y.o. -follow-up for chronic care physician pneumonitis on daily 5 mg prednisone.  She has problems with right-sided chest wall pain that is neuropathic.  She tells me since her last visit January 2020 she has been isolating and social distancing because of the pandemic.  Overall she is been stable.  Her symptom scores are mild and documented below.  She continues with prednisone.  She was supposed to have pulmonary function test but this was held because of the pandemic.  Her walking desaturation test shows she is stable.  She is supposed to have pulmonary function test tomorrow but wanted to see me today.  In the interim she did have a cardiac CT scan of the chest that shows 94th percentile of coronary calcium.   She could not get a CT angiogram done because of PVC.  A nuclear medicine stress test was done and I reviewed this result and it was normal.  A Holter test is pending.  She is kind of nervous because of the ongoing COVID-19 situation and safety of doing a Holter test.  In terms of her right chest wall pain she is adapted to it.  She does not use any bra  OV 08/22/2019  Subjective:  Patient ID: Rhonda [name], female , DOB: 05-06-1951 , age 68 y.o. , MRN: [id number] , AD[address]ESS: [address] [place] [address]   08/22/2019 -   Chief Complaint  Patient presents with   Follow-up    needs sx clearance for L hip replacement- not yet scheduled, pending clearance.     Patient lung disease chronic hypersensitive pneumonitis on chronic daily prednisone 5 mg/day  HPI MILIKA VENTRESS 70 y.o. -presents for follow-up of her interstitial lung disease due to chronic hypersensitive pneumonitis.  After last visit in June 2020 given her PFT stability and also her aversion to the potential side effects with nintedanib she decided to just continue with prednisone daily 5 mg/day.  Given the pandemic she is walking 1 mile a  day without stopping.  Her interstitial lung disease symptom score is actually slightly better.  Overall she reports stability in her interstitial lung disease.  She has a new issue of getting preoperative clearance for her left hip.  She says the left hip is bone-on-bone and she does not want to take Aleve.  She prefers to have surgery.  Dr. Paralee Cancel is the surgeon.  Surgery date has not been set.  She is very functional.  She has normal renal function.  Normal nutritional status.  No anemia.  No respiratory infections in the last 1 month.  She is not on oxygen.  Age 2.  The surgeries in the left hip and the duration of surgery is under 3 hours.  Anesthesia will be general.      OV 01/30/2020  Subjective:  Patient ID: Rhonda [name], female , DOB: 08-Dec-1950 , age 54 y.o. , MRN:  [id number] , [address]ESS: [address] [address] Patient lung disease chronic hypersensitive pneumonitis on chronic daily prednisone 5 mg/day  01/30/2020 -   Chief Complaint  Patient presents with   Follow-up    PFT performed 3/8.  Pt states she believes she has been going downhill for the past 2 months. Pt was started on O2 at last OV and wears it prn. Pt does have an occ cough.     HPI MYSTIQUE BJELLAND 70 y.o. -presents for follow-up of interstitial lung disease secondary to chronic hypersensitive pneumonitis.  She maintains herself on prednisone 5 mg/day.  In the interim she has had hip surgery and this did wonders for her.  She is able to walk longer distances.  However she does notice that her dyspnea has declined.  She says since January 2021 his symptoms have declined particularly with cough.  Usually in the winter she goes through a cycle of severe cough and exacerbation that requires higher levels of prednisone.  She has been social distancing well and masking well.  She states there is absolutely no mold or water any antigen exposure at home.  But she feels it is her allergies acting up and making her more symptomatic.  She has had a Covid vaccine and is wondering about either going back to swimming or starting pulmonary rehabilitation.  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  In fact when we walked her today with the mask she did drop down to 89%.  This is a decline for her.  She had pulmonary function test and this shows significant decline in FVC and DLCO.  In review of this that.  Where she has declined this much but is always bounce back with steroids.  At last visit we discussed nintedanib for her as a way to prevent progression of her ILD but given her concerns of GI side effects and the presence of irritable bowel syndrome she is declined.  Infective have this conversation with her  including her joining the [employer] at [facility] but she has always been worried about the GI side effects with nintedanib.  This time she is more open to it because of the decline in lung function.  She wants to see an allergist  She also told me that she continues to have a restrictive chest pain particularly on the right side lower area.  She no longer wears a brassiere it has been nearly 3 years since she had a high-resolution  CT chest.  She is open to having one now.  Last liver function test was normal in January 2020.  Last renal function was normal in November 2020.      OV 03/19/2020  Subjective:  Patient ID: Rhonda [name], female , DOB: 1951/08/06 , age 75 y.o. , MRN: [id number] , AD[address]ESS: [address] [place] [address]  Chronic hypersensitive pneumonitis interstitial lung disease on prednisone daily 10 mg 03/19/2020 -   Chief Complaint  Patient presents with   Follow-up    Pt states she is doing better since last visit and states her breathing has also improved.     HPI GABRIANNA FASSNACHT 70 y.o. -at last visit in March 2021 there was decline in lung function.  Therefore we started nintedanib.  She took 1 tablet of 150 mg and then had immediate abdominal pain and fever.  Therefore she is decided against taking this.  I also support this decision because I do not think she will tolerate nintedanib.  We did extensive interstitial lung disease question a history again at that time.  She had taken 1 dose of nitrofurantoin.  Have indicated to her that she should never take this medicine again.  In addition discovered that she might have some feather jackets at home she has now gotten rid of it.  She is now doing pulmonary rehabilitation.  Her overnight oxygen desaturation test is negative.  At pulmonary rehabilitation she tells me she does not drop below 91% and 1 time she went to 89%.  Otherwise overall doing great.  Her cardiologist is increased her Lopressor.  Her daughter is getting  married and she is busy with the wedding planning this is being emotionally stressful for her.  Her allergist has increased her recommendations and this is also helping her.  Her allergist is retiring and she wanted recommendations for new allergist.  I have recommended Dr. Fredderick Phenix and Dr. Remus Blake.  Incidentally that right-sided chest pain that she had is also better with increased prednisone of 10 mg/day.  She feels prednisone is working really well for her.    OV 08/29/2020   Subjective:  Patient ID: Rhonda [name], female , DOB: 01/28/1951, age 54 y.o. years. , MRN: [id number],  ADDRESS: [address] [address] PCP  Hayden Rasmussen, MD Providers : Treatment Team:  Attending Provider: Brand Males, MD Patient Care Team: Hayden Rasmussen, MD as PCP - General (Family Medicine) Minus Breeding, MD as PCP - Cardiology (Cardiology) Brand Males, MD as Consulting Physician (Pulmonary Disease) Verl Blalock, Marijo Conception, MD (Inactive) (Cardiology) Kennith Center, RD as Dietitian (Family Medicine) Minus Breeding, MD as Consulting Physician (Cardiology)  Follow-up chronic hypersensitive pneumonitis on chronic prednisone 10 mg/day.  Last high-resolution CT chest March 2021.  Intolerant to nintedanib.  Chief Complaint  Patient presents with   Follow-up    Pt states she had been doing well since last visit up until 3-4 weeks ago and started having problems with her breathing and has had to use her O2 with activities. Pt also has been coughing a lot and is getting up clear phlegm. Pt also has had some mild wheeze.       HPI JENNI THEW 70 y.o. -returns for follow-up.  Since I last saw her her daughter is now married.  She had a great wedding.  She was able to enjoy this and dance quite well.  She barely used her oxygen her effort tolerance was good.  Some 3  [place]  Apparently she had difficulty getting her oxygen tank  to clinic to the electrical port.  She noticed with exertion a pulse ox dropping to the 70s.  She said even before she left a few days prior to that she started having increasing cough.  She notices that these cough cycles get exacerbated in the fall in the winter.  Looking back at her pulmonary function test there is always a dip between October and March each year.  Between 2015 and 2017 there was a decline and since then there is a waxing and waning quality with dips in the winter in the fall season.  Each time she responds to prednisone.  Most recent pulmonary function test in August 2021 is actually improved compared to March 2021.  However today she is feeling worse.  She feels something is in her airway.  She feels dry air in the house and the animals bringing leaves from outside is contributing.  She says she feels better upstairs than downstairs where the dogs.  She again denies any mold or mildew.  Denies any feather jacket exposure.  She feels she will benefit from another round of prednisone.  She feels in the past antibiotics have not helped her as much as prednisone.   In terms of chronic ILD HP: She not tolerate nintedanib in the past.  I explained to her that I am concerned about progression and her decline in functional quality and the risk for that.  We discussed pirfenidone as an alternative.  Discussed the fact is less well studied although have no reason to see why it would not be effective.  Explained the side effect profile.  She is willing to try this.  We decided to go donor samples from the patient support group  Oxygen qualification: We walked her and she qualified for oxygen today.  Is for portable oxygen with [employer]  Other issue: Spring allergies: -Her allergist is retired.  We discussed options of referral.  Referred her to Dr. Hardie Pulley referred to     OV 01/14/2021  Subjective:  Patient ID: Rhonda [name], female , DOB: [birth year] , age 39 y.o. , MRN: [id number] ,  ADDRESS: [address] [address] PCP Hayden Rasmussen, MD Patient Care Team: Hayden Rasmussen, MD as PCP - General (Family Medicine) Minus Breeding, MD as PCP - Cardiology (Cardiology) Brand Males, MD as Consulting Physician (Pulmonary Disease) Verl Blalock, Marijo Conception, MD (Inactive) (Cardiology) Kennith Center, RD as Dietitian (Family Medicine) Minus Breeding, MD as Consulting Physician (Cardiology)  This Provider for this visit: Treatment Team:  Attending Provider: Brand Males, MD    01/14/2021 -   Chief Complaint  Patient presents with   Follow-up    Nauseous, loss of appetite    Follow-up chronic hypersensitive pneumonitis on prednisone 5 mg/day and also pirfenidone submaximal dose of 2 pills 3 times daily since October 2021.  On ILD-pro registry protocol  - failed ofev   - failed esbrit due to side effects Faythe Ghee 2022  She is also on Breo  HPI NOAM FRANZEN 70 y.o. -returns for follow-up.  She is now on pirfenidone along with low-dose prednisone.  She feels the pirfenidone is not reacting well with her at all.  She has intermittent loss of taste at least twice a week.  She has loss of appetite and she has forces herself to eat.  She also has fatigue.  She says that if I strongly recommended then she will continue to soldier on with it.  Otherwise she feels less short of breath.  Pulmonary function test reviewed and is actually stable/slightly improved.  She says the cough is extremely well controlled except today after the pulmonary function test she was coughing quite a bit.  She is looking forward to going back to swimming.  She has had a Covid vaccine in booster and also status post EVUSHELD monoclonal antibody prophylaxis.  We discussed the possibility about possibility of enrolling in inhaled nitric oxide device/administration study.  This is to see if people can improve in the shortness of breath scale and also physical activity.  She took the consent form and  is deliberating.  She thinks she might find the study and inconvenience to her lifestyle where she is wanting to be more active.  We went over several details of this research protocol  She has a new symptom of like itching sensation deep in the right flank.  She feels it is not the skin it is from deep inside.  She is wondering if it could be related to pirfenidone.  Started after going on pirfenidone.  Explained to her that I am not seeing this in other patients with pirfenidone but will have to be open minded.  She does not apply Vaseline to her skin in the winter.  We discussed the possibility this could be dry skin related but she does not have other features of dry skin.  Nevertheless she is willing to try Vaseline.   Results for CHAPP   OV 07/08/2021  Subjective:  Patient ID: Rhonda [name], female , DOB: 26-Feb-1951 , age 23 y.o. , MRN: [id number] , ADDRESS: [address] [place] [address] PCP Hayden Rasmussen, MD Patient Care Team: Hayden Rasmussen, MD as PCP - General (Family Medicine) Minus Breeding, MD as PCP - Cardiology (Cardiology) Brand Males, MD as Consulting Physician (Pulmonary Disease) Verl Blalock, Marijo Conception, MD (Inactive) (Cardiology) Kennith Center, RD as Dietitian (Family Medicine) Minus Breeding, MD as Consulting Physician (Cardiology)  This Provider for this visit: Treatment Team:  Attending Provider: Brand Males, MD    07/08/2021 -  ACUTE VISIT Chief Complaint  Patient presents with   Acute Visit    Pt states that she did start taking zpak yesterday 8/15 and said her cough is doing some better after being on second day of abx and said that she started prednisone today 8/16. States that she still has increased SOB. Pt did take a covid test which came back negative.   Follow-up chronic hypersensitive pneumonitis on prednisone 5 mg/day and also pirfenidone submaximal dose of 2 pills 3 times daily since October 2021.  On ILD-pro registry protocol -  failed ofev   - failed esbrit due to side effects marh 2022  She is also on Breo   HPI SUHANI STILLION 70 y.o. -this is an acute visit.  Last seen in February 2022 after that she was supposed to see me back in 3 months.  But this visit has been scheduled acutely.  She tells me that early in May 2022 she went to [facility].  They rented a house.  She believes that the bedroom had some mold.  She did not visually confirmed the mold but husband might have.  She says shortly after going there and spending a week she started having worsening cough with hoarseness of voice.  This cough is persisted all along and the  hoarseness has persisted as well.  The in June 2022 she had a short course of steroids that did not help.  She believes the dosing was not strong enough.  In July 2022 she had some back issues and was given 30 mg of prednisone for approximately 10 days with a taper and this helped her back.  But all along the cough is persisted and severe hoarseness is persisted.  Then approximately a week ago she started getting shortness of breath along with yellow sputum.  The cough got worse .  She states even at baseline she brings her a lot of white sputum and has to cough a lot.  [place] and a couple of days into the illness.  At this point in time she started noticing she was more easily desaturating.  She is not able to swim.  She says walking to the car her pulse ox dropped to 84%.  Then the yellow phlegm started getting worse.  She called in yesterday and we prescribed Z-Pak and a prednisone burst.  She says now with the Z-Pak the sputum color is improving.  A week ago she also saw Dr. Blenda Nicely for chronic cough.  100 mg 3 times daily of gabapentin has been prescribed.  She is slowly increasing it.  She is worried about the side effects of this drug.  Apparently her vocal cord had ulcers from repeated coughing.  ILD symptom score shows worsening.  In the office  she is very hoarse   Of note she stopped her pirfenidone in February/March 2022 because of side effects - gerd  She uses oxygen with exertion.  There is no leg swelling or hemoptysis.  Started z pak yesterday Started 12d pred taper yesterday     PFT   OV 08/14/2021  Subjective:  Patient ID: Rhonda [name], female , DOB: 1951/01/04 , age 88 y.o. , MRN: [id number] , AD[address]ESS: [address] [place] [address] PCP Hayden Rasmussen, MD Patient Care Team: Hayden Rasmussen, MD as PCP - General (Family Medicine) Minus Breeding, MD as PCP - Cardiology (Cardiology) Brand Males, MD as Consulting Physician (Pulmonary Disease) Verl Blalock, Marijo Conception, MD (Inactive) (Cardiology) Kennith Center, RD as Dietitian (Family Medicine) Minus Breeding, MD as Consulting Physician (Cardiology)  This Provider for this visit: Treatment Team:  Attending Provider: Brand Males, MD  Follow-up chronic hypersensitive pneumonitis on prednisone 5 mg/day and also pirfenidone submaximal dose of 2 pills 3 times daily since October 2021.  On ILD-pro registry protocol - failed ofev   - failed esbrit due to side effects Faythe Ghee 2022  She is also on Breo  08/14/2021 -   Chief Complaint  Patient presents with   Follow-up    Pt states she is feeling better since last visit. States she did receive her lighterweight POC which has been working well for her.     HPI JENNALEE GREAVES 70 y.o. -seen last month with worsening symptoms acute bronchitis/flare.  Given Z-Pak and prednisone.  She is significantly better.  She tells me that even though she is better and his subjective symptom score is better below.  She still feels not back to her May 2022 baseline.  She says in the neighborhood she has to use more oxygen.  She is interested in a backpack for oxygen.  She is not able to swim as well as she used to.  She says acid reflux is under better control now  after seeing Dr. Blenda Nicely in ENT.  She is wondering  about starting pirfenidone she definitely does not want to do nintedanib again.  But even with pirfenidone she is reluctant.  We discussed CellCept because of progression but because of the immunosuppression she is reluctant.  We resolved that she would increase the prednisone to 10 mg/day and reassess.  She was supposed to have done a spirometry today but it did not happen.  She will have it done again in 4 weeks.  Have requested a schedule for that.    The main concern right now even though is better is that she is not to her baseline as of a year ago and she feels like she is having slowly progressive disease interstitial lung disease/chronic HP.       OV 10/03/2021  Subjective:  Patient ID: Rhonda [name], female , DOB: [birth year] , age 69 y.o. , MRN: [id number] , AD[address]ESS: [address] [id number] PCP Hayden Rasmussen, MD Patient Care Team: Hayden Rasmussen, MD as PCP - General (Family Medicine) Minus Breeding, MD as PCP - Cardiology (Cardiology) Brand Males, MD as Consulting Physician (Pulmonary Disease) Verl Blalock, Marijo Conception, MD (Inactive) (Cardiology) Kennith Center, RD as Dietitian (Family Medicine) Minus Breeding, MD as Consulting Physician (Cardiology)  This Provider for this visit: Treatment Team:  Attending Provider: Brand Males, MD    10/03/2021 -   Chief Complaint  Patient presents with   Follow-up    Pt states her breathing has become worse since last visit. States she is having to use her O2 almost all the time now with exertion.    Follow-up chronic hypersensitive pneumonitis on prednisone 5 mg/day and also pirfenidone submaximal dose of 2 pills 3 times daily since October 2021.  On ILD-pro registry protocol - failed ofev due to side efect  - failed esbrit due to sde effects marvh 2022  -Last echo 2018  -Last high-res CT March 2021.  She is also on Breo  HPI LUKA REISCH 70 y.o. -returns for follow-up.  In this visit she is  categorically saying that she is significantly worse.  This point in particular relationship to dyspnea on exertion.  Her weight itself is stable.  She is currently on 5 mg prednisone per day [last time I thought I increased it to 10 mg/day].  Nevertheless she says other interim changes is that she has had slow weight gain.  BMI is 30.  She started semaglutide for hemoglobin A 1.C 6.5.  Is also to help weight.  She was also after seeing Dr. Blenda Nicely for her cough and significant amount of anti-PPI therapy.  She said this made her diarrhea worse.  She then reduced it and currently is taking omeprazole 20 mg/day in the daytime and 40 mg at night and in between Pepcid as well.  She stopped the gabapentin.  She feels after reducing the PPI the diarrhea is improved.  The cough is not as bad as it was even after stopping gabapentin but she continues to be fatigued.  She complains of bloating.  She feels her stomach is full.  She feels acid reflux is worse.  Acid reflux is worse after reducing the dose of H2 blockade.  She also has early satiety.  She also states when she coughs she vomits.  She feels she needs to see Dr. Ethlyn Gallery.  I have messaged him and he hasagreed for him or his PA to see her soon.  Nevertheless symptoms are  significantly worse as seen on the symptom score below.  She is also reporting increased tachycardia with exertion and apparently Dr. Percival Spanish adjusted her beta-blocker.  She has upcoming appointment with them.    She says that at times when she desaturates easily at home even for minimal exertion.  We walked her today with a forehead probe and she actually did much better than expected going down to 89% only at the end of 3 labs [this is actually an improvement from the recent past versus stability].  She might have an erratic finger pulse ox probe at home.   Her pulmonary function shows a significant decline in DLCO.  Last echocardiogram 2018.  Last high-resolution CT chest March  2021.  Never had right heart catheterization.  Hemoglobin normal 14.7 g% in August 2022.           OV 10/21/2021  Subjective:  Patient ID: Rhonda [name], female , DOB: 1951/02/27 , age 51 y.o. , MRN: [id number] , AD[address]ESS: [address] [id number] PCP Hayden Rasmussen, MD Patient Care Team: Hayden Rasmussen, MD as PCP - General (Family Medicine) Minus Breeding, MD as PCP - Cardiology (Cardiology) Brand Males, MD as Consulting Physician (Pulmonary Disease) Verl Blalock, Marijo Conception, MD (Inactive) (Cardiology) Kennith Center, RD as Dietitian (Family Medicine) Minus Breeding, MD as Consulting Physician (Cardiology)  This Provider for this visit: Treatment Team:  Attending Provider: Brand Males, MD  Type of visit: Video Circumstance: COVID-19 national emergency Identification of patient DEMPSEY KNOTEK with 11-27-1950 and MRN [id number] - 2 person identifier Risks: Risks, benefits, limitations of telephone visit explained. Patient understood and verbalized agreement to proceed Anyone else on call: no Patient location:  her home This provider location: [address]; [place]; [place] [address]. [facility]. [phone]    10/21/2021 -     Follow-up chronic hypersensitive pneumonitis on prednisone 5 mg/day and also pirfenidone submaximal dose of 2 pills 3 times daily since October 2021.  On ILD-pro registry protocol - failed ofev due to side efect  - failed esbrit due to sde effects marvh 2022  -Last echo 2018  -Last high-res CT March 2021.  She is also on Breo HPI SHERLYNE CROWNOVER 70 y.o. -returns for follow-up via video visit to discuss test results.  She says currently she is just doing PPI lower dose 20 mg omeprazole in the daytime and famotidine at night.  With this her diarrhea is better.  Also bloating is better.  Correlating with this for she feels her lungs are stable.  She does say that even now when she bends down  she can desaturate.  She is not using oxygen at night.  Her primary care is testing her over no at night to see if she really needs it at night.  Nevertheless she feels somewhat better.  She had echocardiogram because of concern of pulmonary hypertension and there is no evidence of pulmonary hypertension.  She had high-resolution CT chest which the radiologist was described as minimally progressive since 18 months earlier in March 2021.  She had pulmonary function test that shows the FVC to be stable in the last few to several months but the DLCO to show slight decline.  Nevertheless the pattern at least compared to 2 years ago is 1 of progressive ILD on the pulmonary function test.  Certainly the hypoxia with easy exertion compared to a few years ago or a year ago is also a sign that  her ILD is getting worse.  She is likely stable or stabilized in the last few to several months.  We discussed going challenge again with nintedanib or pirfenidone but she is not interested.  We discussed doing CellCept and discussed the side effect profile of this.  She is not interested.  We discussed transplant referral for evaluation now that she is progressing so she can get plugged in and have a good conversation.  She is reflected on this from the last visit and she is not interested  We discussed the acid reflux controlled she feels she is at a good place right now with good acid reflux controlled with PPI in the morning and H2 blockade at night.  She has seen GI and at this point I believe they are not going to do pH probe.  I reviewed the note.  She continues on Breo and prednisone 5 mg/day which she plans to continue.  She has heard about the promedior trial with IV Pnetraxin for IPF.  This might be a study for non-- IPF progressive phenotype.  She prefers to do the study because it bypasses the GI route and it is an IV study.  We do not know if the study is going to happen.  We do not know if you are going to be a  site.  If these things all get aligned then maybe we could consider that.      SYMPTOM SCALE - ILD Sept 2020 01/30/2020  08/29/2020  01/14/2021 146# 07/08/2021 147# - off esbriet. Sic past week 08/14/2021 149# - pred only. No antifibrotic 10/03/2021 148# - pred 23m  O2 use  o2 with ex o2 withe ex 2L     2LL o2 with ex  Shortness of Breath  0 -> 5 scale with 5 being worst (score 6 If unable to do)       At rest 0 0 1 0 1 0 1  Simple tasks - showers, clothes change, eating, shaving 01 1 1 1._0 Household (dishes, doing bed, laundry) 0 _1 3.5  Shopping _2 Walking level at own pace _3 Walking up Stairs _4 Total (30-36) Dyspnea Score _5 10._6 14.5  How bad is your cough? _7 How bad is your fatigue _8 0 3  How bad is nausea  0 0 3 0 0 1  How bad is vomiting?   0 0 0 0 0 1  How bad is diarrhea?  00 1 0 0 0 1  How bad is anxiety?  0 0 0 00 0 2  How bad is depression  0 _9 0 1     Simple office walk 185 feet x  3 laps goal with forehead probe 04/13/2018  08/16/2018  12/15/2018  05/09/2019  08/22/2019  01/30/2020  08/29/2020  01/14/2021  07/08/2021  08/14/2021  10/03/2021   O2 used _10  Room air _11   Number laps completed _12 stopped at 2 die to hip pain 3 laps - no hip pain following hip surgery _13 attempted byt did  only 2 3 bu stopped at 2 3 and did all 3  Comments about pace  good Moderate pace Normal, hip bothering     avg pace avg  avg  Resting Pulse Ox/HR 98% and 73/min 98% and HR 77/min 99% and HR 61/min 98% and HR 70/min 98% 98% and 75/min 97% and 67/mi 94% and 80/min 96% and HR 72 98% and 71 100% and HR 82  Final Pulse Ox/HR 91% and 91/min 93% and 92.min 94% and 92/min 93% and 98/min 91%  89% and 93/,imn 88% and 94 89% ad 88.nin 88% at 2nd  lap end HR 92 86% and 96 89% and HR 91  Desaturated </= 88% no no no no         Desaturated <= 3% points yes  yes Yes, 5 points Yes, 5 points    Yes, 5 points Yes, 8 points Yes, 12 pints Yes 11 pot  Got Tachycardic >/= 90/min yes yes yes yes     yes yes yes  Symptoms at end of test none none Hip pain and very mild dyspnea  Stopped due to hip pain Moderate duyspnea with mask Mild to moderate dyspnea  Severe dyspnea Mild dyspnea Severe dyspnea  Miscellaneous comments x x    No hip pain    Similar to lastime Symptoms out of proportion      PFT  PFT Results Latest Ref Rng & Units 10/03/2021 01/14/2021 11/06/2020 07/18/2020 01/29/2020 05/10/2019 08/16/2018  FVC-Pre L 1.48 1.49 1.41 1.47 1.39 1.62 1.65  FVC-Predicted Pre % 63 62 59 62 57 67 67  FVC-Post L - - - - - - -  FVC-Predicted Post % - - - - - - -  Pre FEV1/FVC % % 92 92 93 88 91 89 90  Post FEV1/FCV % % - - - - - - -  FEV1-Pre L 1.37 1.37 1.31 1.30 1.26 1.45 1.49  FEV1-Predicted Pre % 78 77 73 73 69 80 81  FEV1-Post L - - - - - - -  DLCO uncorrected ml/min/mmHg 8.92 11.26 9.70 11.19 9.36 10.32 9.21  DLCO UNC% % 55 69 59 68 57 63 52  DLCO corrected ml/min/mmHg 8.92 11.26 10.53 11.19 9.36 - -  DLCO COR %Predicted % 55 69 64 68 57 - -  DLVA Predicted % 78 92 89 90 81 93 88  TLC L - - - - - - -  TLC % Predicted % - - - - - - -  RV % Predicted % - - - - - - -    ELL, Charlotta A (MRN [id number]) as of 08/22/2019 11:56  Ref. Range 12/06/2013 13:03 03/15/2014 15:56 09/18/2014 11:41 02/26/2015 09:02 12/16/2015 16:31 04/29/2016 12:44 01/25/2017 10:16 01/11/2018 15:19 04/13/2018 08:47 08/16/2018 09:00 05/10/2019 09:42 3/8?21 07/18/20  FVC-Pre Latest Units: L 1.86 1.91 1.73 1.84 1.42  1.69 1.61 1.35 1.65 1.62 1.39 1.47  FVC-%Pred-Pre Latest Units: % _0 67 57% 62%   Results for Thaxton, Gala A (MRN [id number]) as of 08/22/2019 11:56  Ref. Range 12/06/2013 13:03 03/15/2014 15:56 09/18/2014 11:41 02/26/2015 09:02 12/16/2015 16:31 04/29/2016 12:44 01/25/2017 10:16 01/11/2018 15:19 04/13/2018 08:47 08/16/2018 09:00 05/10/2019 09:42 01/29/20 07/18/20  DLCO unc  Latest Units: ml/min/mmHg 11.99 11.70 11.53 5.52 10.06 12.12 10.43 10.57 10.16 9.21 10.32 9.36 11.19  DLCO unc % pred Latest Units: % _1 63 57% 68%    ECHO 10/15/21  IMPRESSIONS     1. Left ventricular ejection fraction, by estimation, is 55 to 60%. The  left ventricle has normal function. The left ventricle has no regional  wall motion abnormalities. Left ventricular diastolic parameters are  consistent with Grade I diastolic  dysfunction (impaired relaxation).   2. Right ventricular systolic function is normal. The right ventricular  size is normal. There is normal pulmonary artery systolic pressure. The  estimated right ventricular systolic pressure is 62.1 mmHg.   3. The mitral valve is normal in structure. Mild mitral valve  regurgitation.   4. The aortic valve is tricuspid. There is mild thickening of the aortic  valve. Aortic valve regurgitation is trivial.   5. The inferior vena cava is normal in size with greater than 50%  respiratory variability, suggesting right atrial pressure of 3 mmHg.    CT HIGH RES 10/10/21  Narrative & Impression  CLINICAL DATA:  70 year old female with history of worsening shortness of breath. Interstitial lung disease. Follow-up study.   EXAM: CT CHEST WITHOUT CONTRAST   TECHNIQUE: Multidetector CT imaging of the chest was performed following the standard protocol without intravenous contrast. High resolution imaging of the lungs, as well as inspiratory and expiratory imaging, was performed.   COMPARISON:  High-resolution chest CT 02/07/2020.   FINDINGS: Cardiovascular: Heart size is normal. There is no significant pericardial fluid, thickening or pericardial calcification. There is aortic atherosclerosis, as well as atherosclerosis of the great vessels of the mediastinum and the coronary arteries, including calcified atherosclerotic plaque in the left main, left anterior descending, left circumflex and  right coronary arteries.   Mediastinum/Nodes: Mediastinal or no pathologically enlarged hilar lymph nodes. Small hiatal hernia. No axillary lymphadenopathy.   Lungs/Pleura: High-resolution images again demonstrate widespread but patchy areas of ground-glass attenuation, septal thickening, subpleural reticulation, cylindrical bronchiectasis and scattered areas of honeycombing. These findings have no discernible craniocaudal gradient, and appear minimally progressive compared to the prior examination. Inspiratory and expiratory imaging again demonstrates extensive air trapping indicative of small airways disease. No acute consolidative airspace disease. No pleural effusions. No definite suspicious appearing pulmonary nodules or masses are noted.   Upper Abdomen: Aortic atherosclerosis.   Musculoskeletal: There are no aggressive appearing lytic or blastic lesions noted in the visualized portions of the skeleton.   IMPRESSION: 1. The appearance of the lungs is very similar to the prior study with minimal progression, and a spectrum of findings once again categorized as most compatible with an alternative diagnosis (not usual interstitial pneumonia) per current ATS guidelines. Findings are once again favored to represent chronic hypersensitivity pneumonitis. 2. Aortic atherosclerosis, in addition to left main and 3 vessel coronary artery disease. Assessment for potential risk factor modification, dietary therapy or pharmacologic therapy may be warranted, if clinically indicated.   Aortic Atherosclerosis (ICD10-I70.0).     Electronically Signed   By: Vinnie Langton M.D.   On: 10/13/2021 11:32       has a past medical history of Allergy, Arthritis, Asthma, Cataract, Coronary artery calcification seen on CAT scan (08/19/2017), DDD (degenerative disc disease), thoracic, Depression, DOE (dyspnea on exertion), Fatty liver, GERD (gastroesophageal reflux disease), H/O steroid therapy,  Heart palpitations (30/86/5784), Helicobacter pylori ab+, Hemorrhoids, Hiatal hernia, High cholesterol, History of chronic bronchitis, History of migraine, History of MRSA infection, Hyperlipidemia, mixed (08/19/2017), Hyperplastic colon polyp (2007), IBS (irritable bowel syndrome), Inguinal hernia, Insulin resistance, Interstitial lung disease ([place]), MVP (mitral valve prolapse), Pneumonia, Pneumonitis, hypersensitivity ([place]), PONV (postoperative nausea and vomiting), Pre-diabetes, Pulmonary fibrosis ([place]), PVC (premature ventricular contraction) (08/19/2017), Rapid heart rate, Squamous cell carcinoma of skin, Tinnitus, right ear, and Vocal cord  ulcer.   reports that she has never smoked. She has never used smokeless tobacco.  Past Surgical History:  Procedure Laterality Date   11 HOUR [place] STUDY N/A 02/21/2018   Procedure: 24 HOUR PH STUDY;  Surgeon: Mauri Pole, MD;  Location: [facility];  Service: Endoscopy;  Laterality: N/A;   BREAST BIOPSY Right 2009   BIOPSY, pt denies   BREAST BIOPSY Left 2003   Benign    CATARACT EXTRACTION Left    CESAREAN SECTION     COLONOSCOPY     ESOPHAGEAL MANOMETRY N/A 02/21/2018   Procedure: ESOPHAGEAL MANOMETRY (EM);  Surgeon: Mauri Pole, MD;  Location: [facility];  Service: Endoscopy;  Laterality: N/A;   FOOT FRACTURE SURGERY  2006 or 2007   right   HYMENECTOMY     LUNG BIOPSY  09/28/2012   Procedure: LUNG BIOPSY;  Surgeon: Melrose Nakayama, MD;  Location: [facility];  Service: Thoracic;  Laterality: N/A;  lung biopsies tims three   SQUAMOUS CELL CARCINOMA EXCISION Left    left arm   TOTAL HIP ARTHROPLASTY Right 09/10/2015   Procedure: RIGHT TOTAL HIP ARTHROPLASTY ANTERIOR APPROACH;  Surgeon: Paralee Cancel, MD;  Location: [facility];  Service: Orthopedics;  Laterality: Right;   TOTAL HIP ARTHROPLASTY Left 10/03/2019   Procedure: TOTAL HIP ARTHROPLASTY ANTERIOR APPROACH;  Surgeon: Paralee Cancel, MD;  Location: [facility];  Service: Orthopedics;   Laterality: Left;  70 mins   TUBAL LIGATION     UPPER GI ENDOSCOPY     VIDEO ASSISTED THORACOSCOPY  09/28/2012   Procedure: VIDEO ASSISTED THORACOSCOPY;  Surgeon: Melrose Nakayama, MD;  Location: [facility];  Service: Thoracic;  Laterality: Right;   VIDEO BRONCHOSCOPY  11/19/2011   Procedure: VIDEO BRONCHOSCOPY WITH FLUORO;  Surgeon: Brand Males, MD;  Location: [facility];  Service: Endoscopy;;    Allergies  Allergen Reactions   Atorvastatin Other (See Comments)    Leg pain   Betadine [Povidone Iodine] Other (See Comments)    blisters   Codeine Nausea And Vomiting   Garlic Diarrhea   Hydrocodone Nausea And Vomiting   Macrobid [Nitrofurantoin] Other (See Comments)   Ofev [Nintedanib] Other (See Comments)    Abdominal pain   Onion Diarrhea   Other Other (See Comments)   Rosuvastatin Other (See Comments)    Leg pain   Shellfish Allergy Nausea And Vomiting   Sulfa Antibiotics    Sulfonamide Derivatives Other (See Comments)    headaches    Immunization History  Administered Date(s) Administered   Fluad Quad(high Dose 65+) 07/17/2019   Hepatitis A 05/23/2010   Hepatitis B 06/23/2006   Influenza Split 08/11/2012, 08/14/2017   Influenza Whole 09/08/2011   Influenza, High Dose Seasonal PF 08/24/2016, 07/24/2017, 08/31/2018, 08/15/2020, 09/26/2020, 09/12/2021   Influenza,inj,Quad PF,6+ Mos 09/07/2013   Influenza-Unspecified 09/26/2014, 07/27/2015   MMR 05/23/2010   PFIZER(Purple Top)SARS-COV-2 Vaccination 12/16/2019, 01/08/2020, 07/18/2020, 09/26/2020, 08/06/2021   Pneumococcal Conjugate-13 03/15/2014   Pneumococcal Polysaccharide-23 01/11/2018, 09/26/2020, 08/26/2021   Td 02/22/2003   Tdap 10/28/2012   Zoster Recombinat (Shingrix) 02/15/2017, 05/17/2017   Zoster, Live 12/04/2010    Family History  Problem Relation Age of Onset   Emphysema Maternal Grandmother    Asthma Maternal Grandmother    Asthma Mother    Osteoarthritis Mother    Dementia Mother 55   Lymphoma  Father    Diabetes Father    Hypertension Sister    Heart disease Sister    Kidney disease Sister    Lung disease Maternal  Grandfather    Bone cancer Paternal Grandfather    Allergic rhinitis Daughter    Breast cancer Paternal Aunt    Ovarian cancer Maternal Aunt    Breast cancer Cousin    Colon cancer Neg Hx    Angioedema Neg Hx    Eczema Neg Hx    Immunodeficiency Neg Hx    Urticaria Neg Hx      Current Outpatient Medications:    acetaminophen (TYLENOL) 500 MG tablet, Tylenol Extra Strength, Disp: , Rfl:    albuterol (PROAIR HFA) 108 (90 Base) MCG/ACT inhaler, Inhale 2 puffs into the lungs every 4 (four) hours as needed for wheezing. Or coughing spells.  You may use 2 Puffs 5-10 minutes before exercise., Disp: 1 Inhaler, Rfl: 3   Alpha-D-Galactosidase (BEANO PO), Take 1 tablet by mouth as needed (if eating onion or garlic). , Disp: , Rfl:    Ascorbic Acid (VITAMIN C) 1000 MG tablet, Take 1,000 mg by mouth daily., Disp: , Rfl:    ASHWAGANDHA PO, Take 3,000 mg by mouth daily., Disp: , Rfl:    azelastine (ASTELIN) 0.1 % nasal spray, Place 1 spray into both nostrils at bedtime., Disp: , Rfl:    CALCIUM CITRATE PO, Take 2 tablets by mouth daily. Or viactive, Disp: , Rfl:    chlorpheniramine (CHLOR-TRIMETON) 4 MG tablet, Take 2 mg by mouth 2 (two) times daily. Takes half tablet twice daily, Disp: , Rfl:    dicyclomine (BENTYL) 10 MG capsule, Take 1 capsule by mouth daily as needed., Disp: , Rfl:    EPINEPHrine 0.3 mg/0.3 mL IJ SOAJ injection, Inject into the muscle., Disp: , Rfl:    ezetimibe (ZETIA) 10 MG tablet, TAKE 1 TABLET (10 MG TOTAL) BY MOUTH DAILY. PLEASE KEEP UPCOMING APPT FOR FUTURE REFILLS. THANK YOU., Disp: 90 tablet, Rfl: 3   famotidine (PEPCID) 20 MG tablet, TAKE 1 TABLET BY MOUTH EVERY DAY AT NIGHT, Disp: , Rfl:    fluticasone (FLONASE) 50 MCG/ACT nasal spray, SPRAY 2 SPRAYS INTO EACH NOSTRIL EVERY DAY, Disp: 48 mL, Rfl: 1   fluticasone furoate-vilanterol (BREO ELLIPTA)  100-25 MCG/INH AEPB, Inhale 1 puff into the lungs daily., Disp: 180 each, Rfl: 3   lactase (LACTAID) 3000 units tablet, Take by mouth., Disp: , Rfl:    levocetirizine (XYZAL) 5 MG tablet, Take 5 mg by mouth at bedtime., Disp: , Rfl:    metoprolol succinate (TOPROL-XL) 50 MG 24 hr tablet, Take 1.5 tablets (75 mg total) by mouth daily., Disp: 135 tablet, Rfl: 3   montelukast (SINGULAIR) 10 MG tablet, TAKE 1 TABLET BY MOUTH EVERYDAY AT BEDTIME, Disp: 90 tablet, Rfl: 2   Multiple Vitamin (MULTIVITAMIN) tablet, Take 1 tablet by mouth daily., Disp: , Rfl:    nitroGLYCERIN (NITROSTAT) 0.4 MG SL tablet, Place 1 tablet (0.4 mg total) under the tongue every 5 (five) minutes as needed for chest pain., Disp: 25 tablet, Rfl: 12   nystatin-triamcinolone (MYCOLOG II) cream, nystatin-triamcinolone 100,000 unit/g-0.1 % topical cream, Disp: , Rfl:    omeprazole (PRILOSEC OTC) 20 MG tablet, Take 20 mg by mouth daily., Disp: , Rfl:    OXYGEN, Inhale into the lungs. 2 to 3 liters as needed during the day and nite time use on/off, Disp: , Rfl:    OZEMPIC, 0.25 OR 0.5 MG/DOSE, 2 MG/1.5ML SOPN, Inject into the skin., Disp: , Rfl:    pravastatin (PRAVACHOL) 40 MG tablet, Take 1 tablet (40 mg total) by mouth every evening., Disp: 90 tablet, Rfl: 1  Probiotic Product (PROBIOTIC ADVANCED PO), Take 1 capsule by mouth daily., Disp: , Rfl:    Sodium Chloride, Inhalant, 7 % NEBU, Use per nebulizer once a day to help clear sputum, Disp: 120 mL, Rfl: 3   TURMERIC PO, Take 1,000 mg by mouth daily., Disp: , Rfl:    Vitamin D, Ergocalciferol, (DRISDOL) 1.25 MG (50000 UNIT) CAPS capsule, Take 50,000 Units by mouth every 7 (seven) days., Disp: , Rfl:       Objective:   There were no vitals filed for this visit.  Estimated body mass index is 29.89 kg/m as calculated from the following:   Height as of 10/06/21: _0  (1.499 m).   Weight as of 10/06/21: 148 lb (67.1 kg).  _1 @  There were no vitals filed for this  visit.   Physical Exam Video visit only.  She looks well.  She coughed a few times.      Assessment:       ICD-10-CM   1. ILD (interstitial lung disease) (HCC)  J84.9 Pulmonary function test    2. Hypersensitivity pneumonitis ([place])  J67.9 Pulmonary function test         Plan:     Patient Instructions     ICD-10-CM   1. 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 oil pain and ? mold in house  J67.9     2. ILD (interstitial lung disease) ([place])  J84.9     3. Chronic respiratory failure with hypoxia (HCC)  J96.11     4. Gastroesophageal reflux disease, unspecified whether esophagitis present  K21.9     5. Hiatal hernia  K44.9       Pulmonary Fibrosis is every so steadily worse but seems stable last few months esp after reducing heavy dose PPI.  Current GERD dosing is PPI at night and famotidine at night  No evidence of pulmonary hypertension on echo  Too bad ofev +/- esbriet are problematic  Currently on prednisone 83m per day  Plan  -gerd managmene by GI  - respect no rechallenge with esbriet or ofev  - Respect lack of interest in CellCept given side effect profile -Respect declining lung transplant evaluation [not consistent with the goals of care] -Appreciate interested in clinical trial with IV pentraxin and non-IPF progressive phenotype  -However we need to be selected as a site for this  -Anticipate start in spring 2023 if he gets elected  - continue weight loss - goal BMI 27   -go for low carb diet - continue o2 for pulse ox goal > 88% - stay on prednisome 556mper day   Followup -3-4 months to spirometry and DLCO and return to see Dr. RaChase Calleror a face-to-face visit 30-minute slot present shared visit.  But  (Level 04: Estb 30-39 min  visit type: video virtual visit visit spent in total care time and counseling or/and coordination of care by this undersigned MD - Dr MuBrand MalesThis includes one or more of the following on this same day  10/21/2021: pre-charting, chart review, note writing, documentation discussion of test results, diagnostic or treatment recommendations, prognosis, risks and benefits of management options, instructions, education, compliance or risk-factor reduction. It excludes time spent by the [facility] office staff in the care of the patient . Actual time is 38 min)  SIGNATURE    Dr. MuBrand MalesM.D., F.C.C.P,  Pulmonary and Critical Care Medicine Staff Physician, Co[place]auirector - Interstitial Lung Disease  Program  [facility] -  [place], [place], [address]  Pager: [phone], If no answer or between  15:00h - 7:00h: call [phone] Telephone: [phone]  12:40 PM 10/21/2021

## 2021-10-21 NOTE — Patient Instructions (Addendum)
ICD-10-CM   1. 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 oil pain and ? mold in house  J67.9     2. ILD (interstitial lung disease) ([place])  J84.9     3. Chronic respiratory failure with hypoxia (HCC)  J96.11     4. Gastroesophageal reflux disease, unspecified whether esophagitis present  K21.9     5. Hiatal hernia  K44.9       Pulmonary Fibrosis is every so steadily worse but seems stable last few months esp after reducing heavy dose PPI.  Current GERD dosing is PPI at night and famotidine at night  No evidence of pulmonary hypertension on echo  Too bad ofev +/- esbriet are problematic  Currently on prednisone 5mg  per day  Plan  -gerd managmene by GI  - respect no rechallenge with esbriet or ofev  - Respect lack of interest in CellCept given side effect profile -Respect declining lung transplant evaluation [not consistent with the goals of care] -Appreciate interested in clinical trial with IV pentraxin and non-IPF progressive phenotype  -However we need to be selected as a site for this  -Anticipate start in spring 2023 if he gets elected  - continue weight loss - goal BMI 27   -go for low carb diet - continue o2 for pulse ox goal > 88% - stay on prednisome 5mg  per day   Followup -3-4 months to spirometry and DLCO and return to see Dr. Chase Caller for a face-to-face visit 30-minute slot present shared visit.  But

## 2021-10-26 NOTE — Progress Notes (Signed)
Cardiology Office Note   Date:  10/27/2021   ID:  [name], DOB 05/09/1951, MRN [id number]  PCP:  Hayden Rasmussen, MD  Cardiologist:   Minus Breeding, MD   Chief Complaint  Patient presents with   Palpitations      History of Present Illness: Rhonda [name] is a 70 y.o. female who presents for follow up of coronary calcium and PVCs.  She had a normal nuclear stress test in 2020.  She has premature ventricular contractions.  She has chronic interstitial lung disease.  She does have home O2.  Her calcium score was 343.    She returns for follow up.  She has had a rough time with an exacerbation of her lung problems.  [place]  She has needed rounds of steroids and antibiotics.  During all of this she had increased palpitations.  She sent [place] some rhythm strips and there were PVCs.  I increased her metoprolol to 75 mg daily and she seems to have done much better with this and now is finally recovering from her lung issues.  She uses as needed oxygen.  She did have an echocardiogram and I reviewed ordered by pulmonary.  There was no elevated pulmonary pressures.  Her left ventricular function was normal.  There are some minor valvular abnormalities.   Past Medical History:  Diagnosis Date   Allergy    Arthritis    history spinal stenosis. osteoarthritis right hip   Asthma    Cataract    Coronary artery calcification seen on CAT scan 08/19/2017   >300 on CT scan 08/2017   DDD (degenerative disc disease), thoracic    Depression    DOE (dyspnea on exertion)    a. 04/2010 Lexi MV EF 71%, no ischemia/infarct;     Fatty liver    GERD (gastroesophageal reflux disease)    H/O steroid therapy    Steroid use orally over 4 yrs- for Lung Fibrosis   Heart palpitations 31/51/7616   Helicobacter pylori ab+    Hemorrhoids    Hiatal hernia    High cholesterol    History of chronic bronchitis    as child    History of migraine    History of MRSA infection    Hyperlipidemia, mixed 08/19/2017   Hyperplastic colon polyp 2007   IBS (irritable bowel syndrome)    Inguinal hernia    right   Insulin resistance    past   Interstitial lung disease (HCC)    MVP (mitral valve prolapse)    Posterior mitral valve leaflet with mild MR   Pneumonia    Pneumonitis, hypersensitivity ([place])    a. 09/2012 s/p Bx - ? 2/2 bird, mold, oil paint exposure ->on steroids, followed by pulm.   PONV (postoperative nausea and vomiting)    Pre-diabetes    takes Metformin   Pulmonary fibrosis (HCC)    Dr. Chase Caller follows- stable at present   PVC (premature ventricular contraction) 08/19/2017   Rapid heart rate    Dr. Radford Pax follows- last visit Epic note 9'16   Squamous cell carcinoma of skin    Tinnitus, right ear    Vocal cord ulcer     Past Surgical History:  Procedure Laterality Date   36 HOUR [place] STUDY N/A 02/21/2018   Procedure: [place];  Surgeon: Mauri Pole, MD;  Location: [facility];  Service: Endoscopy;  Laterality:  N/A;   BREAST BIOPSY Right 2009   BIOPSY, pt denies   BREAST BIOPSY Left 2003   Benign    CATARACT EXTRACTION Left    CESAREAN SECTION     COLONOSCOPY     ESOPHAGEAL MANOMETRY N/A 02/21/2018   Procedure: ESOPHAGEAL MANOMETRY (EM);  Surgeon: Mauri Pole, MD;  Location: [facility];  Service: Endoscopy;  Laterality: N/A;   FOOT FRACTURE SURGERY  2006 or 2007   right   HYMENECTOMY     LUNG BIOPSY  09/28/2012   Procedure: LUNG BIOPSY;  Surgeon: Melrose Nakayama, MD;  Location: [facility];  Service: Thoracic;  Laterality: N/A;  lung biopsies tims three   SQUAMOUS CELL CARCINOMA EXCISION Left    left arm   TOTAL HIP ARTHROPLASTY Right 09/10/2015   Procedure: RIGHT TOTAL HIP ARTHROPLASTY ANTERIOR APPROACH;  Surgeon: Paralee Cancel, MD;  Location: [facility];  Service: Orthopedics;  Laterality: Right;   TOTAL HIP ARTHROPLASTY Left 10/03/2019   Procedure: TOTAL HIP  ARTHROPLASTY ANTERIOR APPROACH;  Surgeon: Paralee Cancel, MD;  Location: [facility];  Service: Orthopedics;  Laterality: Left;  70 mins   TUBAL LIGATION     UPPER GI ENDOSCOPY     VIDEO ASSISTED THORACOSCOPY  09/28/2012   Procedure: VIDEO ASSISTED THORACOSCOPY;  Surgeon: Melrose Nakayama, MD;  Location: [facility];  Service: Thoracic;  Laterality: Right;   VIDEO BRONCHOSCOPY  11/19/2011   Procedure: VIDEO BRONCHOSCOPY WITH FLUORO;  Surgeon: Brand Males, MD;  Location: [facility];  Service: Endoscopy;;     Current Outpatient Medications  Medication Sig Dispense Refill   acetaminophen (TYLENOL) 500 MG tablet Tylenol Extra Strength     albuterol (PROAIR HFA) 108 (90 Base) MCG/ACT inhaler Inhale 2 puffs into the lungs every 4 (four) hours as needed for wheezing. Or coughing spells.  You may use 2 Puffs 5-10 minutes before exercise. 1 Inhaler 3   Alpha-D-Galactosidase (BEANO PO) Take 1 tablet by mouth as needed (if eating onion or garlic).      Ascorbic Acid (VITAMIN C) 1000 MG tablet Take 1,000 mg by mouth daily.     ASHWAGANDHA PO Take 3,000 mg by mouth daily.     azelastine (ASTELIN) 0.1 % nasal spray Place 1 spray into both nostrils at bedtime.     CALCIUM CITRATE PO Take 2 tablets by mouth daily. Or viactive     chlorpheniramine (CHLOR-TRIMETON) 4 MG tablet Take 2 mg by mouth 2 (two) times daily. Takes half tablet twice daily     dicyclomine (BENTYL) 10 MG capsule Take 1 capsule by mouth daily as needed.     EPINEPHrine 0.3 mg/0.3 mL IJ SOAJ injection Inject into the muscle.     ezetimibe (ZETIA) 10 MG tablet TAKE 1 TABLET (10 MG TOTAL) BY MOUTH DAILY. PLEASE KEEP UPCOMING APPT FOR FUTURE REFILLS. THANK YOU. 90 tablet 3   famotidine (PEPCID) 20 MG tablet TAKE 1 TABLET BY MOUTH EVERY DAY AT NIGHT     fluticasone (FLONASE) 50 MCG/ACT nasal spray SPRAY 2 SPRAYS INTO EACH NOSTRIL EVERY DAY 48 mL 1   fluticasone furoate-vilanterol (BREO ELLIPTA) 100-25 MCG/INH AEPB Inhale 1 puff into the lungs  daily. 180 each 3   lactase (LACTAID) 3000 units tablet Take by mouth.     levocetirizine (XYZAL) 5 MG tablet Take 5 mg by mouth at bedtime.     montelukast (SINGULAIR) 10 MG tablet TAKE 1 TABLET BY MOUTH EVERYDAY AT BEDTIME 90 tablet 2   Multiple Vitamin (MULTIVITAMIN) tablet Take 1  tablet by mouth daily.     nitroGLYCERIN (NITROSTAT) 0.4 MG SL tablet Place 1 tablet (0.4 mg total) under the tongue every 5 (five) minutes as needed for chest pain. 25 tablet 12   nystatin-triamcinolone (MYCOLOG II) cream nystatin-triamcinolone 100,000 unit/g-0.1 % topical cream     omeprazole (PRILOSEC OTC) 20 MG tablet Take 20 mg by mouth daily.     OXYGEN Inhale into the lungs. 2 to 3 liters as needed during the day and nite time use on/off     OZEMPIC, 0.25 OR 0.5 MG/DOSE, 2 MG/1.5ML SOPN Inject into the skin.     pravastatin (PRAVACHOL) 40 MG tablet Take 1 tablet (40 mg total) by mouth every evening. 90 tablet 1   predniSONE (DELTASONE) 5 MG tablet 1 tablet     Probiotic Product (PROBIOTIC ADVANCED PO) Take 1 capsule by mouth daily.     Sodium Chloride, Inhalant, 7 % NEBU Use per nebulizer once a day to help clear sputum 120 mL 3   TURMERIC PO Take 1,000 mg by mouth daily.     Vitamin D, Ergocalciferol, (DRISDOL) 1.25 MG (50000 UNIT) CAPS capsule Take 50,000 Units by mouth every 7 (seven) days.     metoprolol succinate (TOPROL-XL) 50 MG 24 hr tablet Take 1.5 tablets (75 mg total) by mouth daily. 135 tablet 3   No current facility-administered medications for this visit.    Allergies:   Atorvastatin, Betadine [povidone iodine], Codeine, Garlic, Hydrocodone, Macrobid [nitrofurantoin], Ofev [nintedanib], Onion, Other, Rosuvastatin, Shellfish allergy, Sulfa antibiotics, and Sulfonamide derivatives    ROS:  Please see the history of present illness.   Otherwise, review of systems are positive for none.   All other systems are reviewed and negative.    PHYSICAL EXAM: VS:  BP 112/68   Pulse 75   Ht 5' (1.524  m)   Wt 146 lb 6.4 oz (66.4 kg)   SpO2 96%   BMI 28.59 kg/m  , BMI Body mass index is 28.59 kg/m. GENERAL:  Well appearing NECK:  No jugular venous distention, waveform within normal limits, carotid upstroke brisk and symmetric, no bruits, no thyromegaly LUNGS:  Clear to auscultation bilaterally CHEST: Diffuse fine crackles throughout HEART:  PMI not displaced or sustained,S1 and S2 within normal limits, no S3, no S4, no clicks, no rubs, no murmurs ABD:  Flat, positive bowel sounds normal in frequency in pitch, no bruits, no rebound, no guarding, no midline pulsatile mass, no hepatomegaly, no splenomegaly EXT:  2 plus pulses throughout, no edema, no cyanosis no clubbing   EKG:  EKG is  ordered today. Sinus rhythm, rate 75, axis within normal limits, intervals within normal limits, nonspecific anterior T wave changes.  Recent Labs: 07/08/2021: ALT 15; BUN 15; Creatinine, Ser 1.19; Hemoglobin 14.7; Platelets 218.0; Potassium 4.1; Sodium 138    Lipid Panel    Component Value Date/Time   CHOL 177 12/08/2018 0917   TRIG 131 12/08/2018 0917   HDL 68 12/08/2018 0917   CHOLHDL 2.6 12/08/2018 0917   CHOLHDL 3.5 02/26/2015 1540   VLDL 37 02/26/2015 1540   LDLCALC 83 12/08/2018 0917      Wt Readings from Last 3 Encounters:  10/27/21 146 lb 6.4 oz (66.4 kg)  10/06/21 148 lb (67.1 kg)  10/03/21 148 lb 12.8 oz (67.5 kg)      Other studies Reviewed: Additional studies/ records that were reviewed today include: Pulmonary notes and echo Review of the above records demonstrates:  Please see elsewhere in the note.  ASSESSMENT AND PLAN:  Coronary artery calcifications on CT:    She had negative stress test in 2020 and no new symptoms.  No change in therapy.  PVCs : She is improved and will continue the increased metoprolol.  No change in therapy.  Dyslipidemia: 82 with an HDL of 74.  She will continue the meds as listed.  This was in March of last year and should be repeated  fasting when she sees her primary provider.  Pulmonary HTN: She has no significant pulmonary hypertension on the recent echo and I reviewed this with this visit.  Current medicines are reviewed at length with the patient today.  The patient does not have concerns regarding medicines.  The following changes have been made:  None  Labs/ tests ordered today include: None  Orders Placed This Encounter  Procedures   EKG 12-Lead      Disposition:   FU with me in 12 months.     Signed, Minus Breeding, MD  10/27/2021 12:54 PM    [facility]

## 2021-10-27 ENCOUNTER — Ambulatory Visit (INDEPENDENT_AMBULATORY_CARE_PROVIDER_SITE_OTHER): Payer: Medicare Other | Admitting: Cardiology

## 2021-10-27 ENCOUNTER — Encounter: Payer: Self-pay | Admitting: Cardiology

## 2021-10-27 ENCOUNTER — Other Ambulatory Visit: Payer: Self-pay

## 2021-10-27 VITALS — BP 112/68 | HR 75 | Ht 60.0 in | Wt 146.4 lb

## 2021-10-27 DIAGNOSIS — E785 Hyperlipidemia, unspecified: Secondary | ICD-10-CM

## 2021-10-27 DIAGNOSIS — I272 Pulmonary hypertension, unspecified: Secondary | ICD-10-CM

## 2021-10-27 DIAGNOSIS — R931 Abnormal findings on diagnostic imaging of heart and coronary circulation: Secondary | ICD-10-CM

## 2021-10-27 DIAGNOSIS — I493 Ventricular premature depolarization: Secondary | ICD-10-CM | POA: Diagnosis not present

## 2021-10-27 MED ORDER — METOPROLOL SUCCINATE ER 50 MG PO TB24
75.0000 mg | ORAL_TABLET | Freq: Every day | ORAL | 3 refills | Status: DC
Start: 2021-10-27 — End: 2021-10-30

## 2021-10-27 NOTE — Patient Instructions (Signed)

## 2021-10-28 DIAGNOSIS — R7301 Impaired fasting glucose: Secondary | ICD-10-CM | POA: Diagnosis not present

## 2021-10-28 DIAGNOSIS — J8410 Pulmonary fibrosis, unspecified: Secondary | ICD-10-CM | POA: Diagnosis not present

## 2021-10-28 DIAGNOSIS — K580 Irritable bowel syndrome with diarrhea: Secondary | ICD-10-CM | POA: Diagnosis not present

## 2021-10-28 DIAGNOSIS — E785 Hyperlipidemia, unspecified: Secondary | ICD-10-CM | POA: Diagnosis not present

## 2021-10-29 ENCOUNTER — Encounter: Payer: Self-pay | Admitting: Cardiology

## 2021-10-29 ENCOUNTER — Encounter: Payer: Self-pay | Admitting: Internal Medicine

## 2021-10-30 MED ORDER — PREDNISONE 10 MG PO TABS: 10.0000 mg | ORAL_TABLET | Freq: Every day | ORAL | 1 refills | Status: DC

## 2021-10-30 MED ORDER — METOPROLOL SUCCINATE ER 50 MG PO TB24: 75.0000 mg | ORAL_TABLET | Freq: Every day | ORAL | 3 refills | Status: DC

## 2021-11-13 DIAGNOSIS — G4736 Sleep related hypoventilation in conditions classified elsewhere: Secondary | ICD-10-CM | POA: Diagnosis not present

## 2021-11-13 DIAGNOSIS — J8410 Pulmonary fibrosis, unspecified: Secondary | ICD-10-CM | POA: Diagnosis not present

## 2021-11-13 DIAGNOSIS — I471 Supraventricular tachycardia: Secondary | ICD-10-CM | POA: Diagnosis not present

## 2021-11-29 ENCOUNTER — Encounter: Payer: Self-pay | Admitting: Internal Medicine

## 2021-12-01 MED ORDER — FLUTICASONE FUROATE-VILANTEROL 100-25 MCG/ACT IN AEPB: 1.0000 | INHALATION_SPRAY | Freq: Every day | RESPIRATORY_TRACT | 3 refills | Status: DC

## 2021-12-06 ENCOUNTER — Other Ambulatory Visit: Payer: Self-pay | Admitting: Cardiology

## 2021-12-24 ENCOUNTER — Emergency Department (HOSPITAL_COMMUNITY): Payer: Medicare PPO

## 2021-12-24 ENCOUNTER — Emergency Department (HOSPITAL_COMMUNITY)
Admission: EM | Admit: 2021-12-24 | Discharge: 2021-12-24 | Disposition: A | Discharge status: Home / Self Care | Payer: Medicare PPO | Attending: Emergency Medicine | Admitting: Emergency Medicine

## 2021-12-24 ENCOUNTER — Encounter (HOSPITAL_COMMUNITY): Payer: Self-pay | Admitting: Emergency Medicine

## 2021-12-24 DIAGNOSIS — Z794 Long term (current) use of insulin: Secondary | ICD-10-CM | POA: Insufficient documentation

## 2021-12-24 DIAGNOSIS — Z7951 Long term (current) use of inhaled steroids: Secondary | ICD-10-CM | POA: Diagnosis not present

## 2021-12-24 DIAGNOSIS — M25512 Pain in left shoulder: Secondary | ICD-10-CM | POA: Diagnosis not present

## 2021-12-24 DIAGNOSIS — M546 Pain in thoracic spine: Secondary | ICD-10-CM | POA: Insufficient documentation

## 2021-12-24 DIAGNOSIS — Z85828 Personal history of other malignant neoplasm of skin: Secondary | ICD-10-CM | POA: Insufficient documentation

## 2021-12-24 DIAGNOSIS — J45909 Unspecified asthma, uncomplicated: Secondary | ICD-10-CM | POA: Diagnosis not present

## 2021-12-24 DIAGNOSIS — R7303 Prediabetes: Secondary | ICD-10-CM | POA: Insufficient documentation

## 2021-12-24 DIAGNOSIS — G8929 Other chronic pain: Secondary | ICD-10-CM | POA: Insufficient documentation

## 2021-12-24 MED ORDER — PREDNISONE 20 MG PO TABS: 40.0000 mg | ORAL_TABLET | Freq: Every day | ORAL | 0 refills | Status: AC

## 2021-12-24 MED ORDER — METHOCARBAMOL 500 MG PO TABS: 500.0000 mg | ORAL_TABLET | Freq: Three times a day (TID) | ORAL | 0 refills | Status: DC | PRN

## 2021-12-24 NOTE — ED Triage Notes (Signed)
Patient here with complaint of increased in chronic back pain that started at 0300 this morning. Patient states she has been taking prednisone for eleven years and usually uses salonpas patches for management. Patient alert, oriented, ambulatory, and in no apparent distress at this time.

## 2021-12-24 NOTE — ED Provider Triage Note (Signed)
Emergency Medicine Provider Triage Evaluation Note  Rhonda [name] , a 71 y.o. female  was evaluated in triage.  Patient is on chronic prednisone for pulmonary fibrosis.  Pt complains of left shoulder pain starting early this morning.  Patient was trying to sleep and realize she could not get comfortable and pain is worsened.  No shortness of breath.  No real anterior chest pain, just pain in the anterior and posterior shoulder.  Not worse with palpation but worse with movement.  No fevers or cough.  She denies injuries but does swim regularly.  This feels different than pain she feels more chronically.  Review of Systems  Positive: Shoulder pain Negative: Shortness of breath  Physical Exam  BP (!) 146/90 (BP Location: Right Arm)    Pulse 82    Temp 98.2 F (36.8 C) (Oral)    Resp 16    SpO2 93%  Gen:   Awake, no distress   Resp:  Normal effort  MSK:   Moves left upper extremity with some pain Other:  Heart regular rate and rhythm  Medical Decision Making  Medically screening exam initiated at 3:10 PM.  Appropriate orders placed.  HERMENIA FRITCHER was informed that the remainder of the evaluation will be completed by another provider, this initial triage assessment does not replace that evaluation, and the importance of remaining in the ED until their evaluation is complete.     Carlisle Cater, PA-C 12/24/21 1512

## 2021-12-24 NOTE — ED Provider Notes (Signed)
[facility] EMERGENCY DEPARTMENT Provider Note   CSN: [id number] Arrival date & time: 12/24/21  1329     History  Chief Complaint  Patient presents with   Back Pain    Rhonda [name] is a 71 y.o. female.   Back Pain Associated symptoms: no abdominal pain, no chest pain, no numbness and no weakness   Patient with acute on chronic back pain.  Has some chronic back pain in the left upper back.  States over the last week has become more severe.  Now it goes up into the neck.  Worse when she turns her head.  Also will go down the left arm.  Worse with certain movements.  No numbness weakness.  No fall.  No injury.  Is on chronic prednisone for lung disease.  States she normally swims in the loosen up the muscles but had not been working.  No release with her Salonpas patches.  No fevers or chills.   Past Medical History:  Diagnosis Date   Allergy    Arthritis    history spinal stenosis. osteoarthritis right hip   Asthma    Cataract    Coronary artery calcification seen on CAT scan 08/19/2017   >300 on CT scan 08/2017   DDD (degenerative disc disease), thoracic    Depression    DOE (dyspnea on exertion)    a. 04/2010 Lexi MV EF 71%, no ischemia/infarct;     Fatty liver    GERD (gastroesophageal reflux disease)    H/O steroid therapy    Steroid use orally over 4 yrs- for Lung Fibrosis   Heart palpitations 06/03/1974   Helicobacter pylori ab+    Hemorrhoids    Hiatal hernia    High cholesterol    History of chronic bronchitis    as child   History of migraine    History of MRSA infection    Hyperlipidemia, mixed 08/19/2017   Hyperplastic colon polyp 2007   IBS (irritable bowel syndrome)    Inguinal hernia    right   Insulin resistance    past   Interstitial lung disease (HCC)    MVP (mitral valve prolapse)    Posterior mitral valve leaflet with mild MR   Pneumonia    Pneumonitis, hypersensitivity ([place])    a. 09/2012 s/p Bx - ? 2/2 bird, mold, oil  paint exposure ->on steroids, followed by pulm.   PONV (postoperative nausea and vomiting)    Pre-diabetes    takes Metformin   Pulmonary fibrosis (HCC)    Dr. Chase Caller follows- stable at present   PVC (premature ventricular contraction) 08/19/2017   Rapid heart rate    Dr. Radford Pax follows- last visit Epic note 9'16   Squamous cell carcinoma of skin    Tinnitus, right ear    Vocal cord ulcer     Home Medications Prior to Admission medications   Medication Sig Start Date End Date Taking? Authorizing Provider  methocarbamol (ROBAXIN) 500 MG tablet Take 1 tablet (500 mg total) by mouth every 8 (eight) hours as needed for muscle spasms. 12/24/21  Yes Davonna Belling, MD  predniSONE (DELTASONE) 20 MG tablet Take 2 tablets (40 mg total) by mouth daily for 4 days. 12/24/21 12/28/21 Yes Davonna Belling, MD  acetaminophen (TYLENOL) 500 MG tablet Tylenol Extra Strength 08/24/19   [provider]  albuterol (PROAIR HFA) 108 (90 Base) MCG/ACT inhaler Inhale 2 puffs into the lungs every 4 (four) hours as needed for wheezing. Or coughing spells.  You may use 2 Puffs 5-10 minutes before exercise. 04/25/18   Ambs, Kathrine Cords, FNP  Alpha-D-Galactosidase (BEANO PO) Take 1 tablet by mouth as needed (if eating onion or garlic).     [provider]  Ascorbic Acid (VITAMIN C) 1000 MG tablet Take 1,000 mg by mouth daily.    [provider]  ASHWAGANDHA PO Take 3,000 mg by mouth daily.    [provider]  azelastine (ASTELIN) 0.1 % nasal spray Place 1 spray into both nostrils at bedtime. 09/26/20   [provider]  CALCIUM CITRATE PO Take 2 tablets by mouth daily. Or viactive    [provider]  chlorpheniramine (CHLOR-TRIMETON) 4 MG tablet Take 2 mg by mouth 2 (two) times daily. Takes half tablet twice daily    [provider]  dicyclomine (BENTYL) 10 MG capsule Take 1 capsule by mouth daily as needed. 08/16/18   [provider]  EPINEPHrine 0.3  mg/0.3 mL IJ SOAJ injection Inject into the muscle.    [provider]  ezetimibe (ZETIA) 10 MG tablet TAKE 1 TABLET (10 MG TOTAL) BY MOUTH DAILY. PLEASE KEEP UPCOMING APPT FOR FUTURE REFILLS. THANK YOU. 12/08/21   Minus Breeding, MD  famotidine (PEPCID) 20 MG tablet TAKE 1 TABLET BY MOUTH EVERY DAY AT NIGHT 07/07/21   [provider]  fluticasone (FLONASE) 50 MCG/ACT nasal spray SPRAY 2 SPRAYS INTO EACH NOSTRIL EVERY DAY 06/12/20   Brand Males, MD  fluticasone furoate-vilanterol (BREO ELLIPTA) 100-25 MCG/ACT AEPB Inhale 1 puff into the lungs daily. 12/01/21   Brand Males, MD  lactase (LACTAID) 3000 units tablet Take by mouth.    [provider]  levocetirizine (XYZAL) 5 MG tablet Take 5 mg by mouth at bedtime. 09/26/20   [provider]  metoprolol succinate (TOPROL-XL) 50 MG 24 hr tablet Take 1.5 tablets (75 mg total) by mouth daily. 10/30/21   Minus Breeding, MD  montelukast (SINGULAIR) 10 MG tablet TAKE 1 TABLET BY MOUTH EVERYDAY AT BEDTIME 01/29/20   Brand Males, MD  Multiple Vitamin (MULTIVITAMIN) tablet Take 1 tablet by mouth daily.    [provider]  nitroGLYCERIN (NITROSTAT) 0.4 MG SL tablet Place 1 tablet (0.4 mg total) under the tongue every 5 (five) minutes as needed for chest pain. 12/06/18 10/27/21  Kroeger, Lorelee Cover., PA-C  nystatin-triamcinolone (MYCOLOG II) cream nystatin-triamcinolone 100,000 unit/g-0.1 % topical cream    [provider]  omeprazole (PRILOSEC OTC) 20 MG tablet Take 20 mg by mouth daily.    [provider]  OXYGEN Inhale into the lungs. 2 to 3 liters as needed during the day and nite time use on/off    [provider]  OZEMPIC, 0.25 OR 0.5 MG/DOSE, 2 MG/1.5ML SOPN Inject into the skin. 09/23/21   [provider]  pravastatin (PRAVACHOL) 40 MG tablet Take 1 tablet (40 mg total) by mouth every evening. 06/12/20   Minus Breeding, MD  predniSONE (DELTASONE) 5 MG tablet 1 tablet     [provider]  Probiotic Product (PROBIOTIC ADVANCED PO) Take 1 capsule by mouth daily.    [provider]  Sodium Chloride, Inhalant, 7 % NEBU Use per nebulizer once a day to help clear sputum 07/08/21   Brand Males, MD  TURMERIC PO Take 1,000 mg by mouth daily.    [provider]  Vitamin D, Ergocalciferol, (DRISDOL) 1.25 MG (50000 UNIT) CAPS capsule Take 50,000 Units by mouth every 7 (seven) days.    [provider]  Allergies    Atorvastatin, Betadine [povidone iodine], Codeine, Garlic, Hydrocodone, Macrobid [nitrofurantoin], Ofev [nintedanib], Onion, Other, Rosuvastatin, Shellfish allergy, Sulfa antibiotics, and Sulfonamide derivatives    Review of Systems   Review of Systems  Constitutional:  Negative for appetite change.  Respiratory:  Negative for shortness of breath.   Cardiovascular:  Negative for chest pain.  Gastrointestinal:  Negative for abdominal pain.  Musculoskeletal:  Positive for back pain.  Neurological:  Negative for weakness and numbness.   Physical Exam Updated Vital Signs BP (!) 155/79    Pulse 86    Temp 99.5 F (37.5 C)    Resp 16    SpO2 96%  Physical Exam Vitals and nursing note reviewed.  Cardiovascular:     Rate and Rhythm: Regular rhythm.  Pulmonary:     Breath sounds: No wheezing or rhonchi.  Abdominal:     Tenderness: There is no abdominal tenderness.  Musculoskeletal:        General: Tenderness present.     Comments: Tenderness over left trapezius.  No rash.  Some pain patches on the posterior chest.  Some mild tenderness over C7 posteriorly.  Neurovascularly intact in left hand with good pulse.  Skin:    Capillary Refill: Capillary refill takes less than 2 seconds.  Neurological:     Mental Status: She is alert and oriented to person, place, and time.    ED Results / Procedures / Treatments   Labs (all labs ordered are listed, but only abnormal results are displayed) Labs Reviewed - No data to  display  EKG EKG Interpretation  Date/Time:  Wednesday December 24 2021 15:21:31 EST Ventricular Rate:  80 PR Interval:  132 QRS Duration: 70 QT Interval:  364 QTC Calculation: 419 R Axis:   -4 Text Interpretation: Normal sinus rhythm Minimal voltage criteria for LVH, may be normal variant ( R in aVL ) Nonspecific ST and T wave abnormality Abnormal ECG When compared with ECG of 07-Aug-2013 23:59, PREVIOUS ECG IS PRESENT No significant change since last tracing Confirmed by Davonna Belling [phone]) on 12/24/2021 8:03:49 PM  Radiology DG Chest 2 View  Result Date: 12/24/2021 CLINICAL DATA:  On chronic prednisone for pulmonary fibrosis. Left shoulder pain. No new lung symptoms. EXAM: CHEST - 2 VIEW COMPARISON:  07/08/2021 FINDINGS: Cardiac silhouette and mediastinal contours are within normal limits. There are mildly to moderately decreased lung volumes. No significant change in moderate to high-grade bilateral mid and lower lung interstitial thickening. No new focal airspace opacity. No pleural effusion or pneumothorax. Moderate multilevel degenerative disc changes of the thoracic spine. IMPRESSION: No significant change from prior. Chronic interstitial lung disease. No acute lung process. Electronically Signed   By: Yvonne Kendall M.D.   On: 12/24/2021 15:54   DG Shoulder Left  Result Date: 12/24/2021 CLINICAL DATA:  shoulder pain EXAM: LEFT SHOULDER - 2+ VIEW COMPARISON:  History FINDINGS: There is no evidence of fracture or dislocation. There is no evidence of arthropathy or other focal bone abnormality. Soft tissues are unremarkable. Please see separately dictated chest x-ray 12/24/2021 regarding the lungs. IMPRESSION: No acute displaced fracture or dislocation. Electronically Signed   By: Iven Finn M.D.   On: 12/24/2021 15:53    Procedures Procedures    Medications Ordered in ED Medications - No data to display  ED Course/ Medical Decision Making/ A&P  Medical Decision Making Amount and/or Complexity of Data Reviewed Radiology: ordered.  Risk Prescription drug management.   Patient presents with pain in her left shoulder/back.  Goes down the left arm.  Initial differential diagnosis is long and includes cardiac cause, musculoskeletal pain, shingles.  Has had some chronic back pain but states it is normally a little lower than this.  This 1 goes but more into the neck.  No fall.  Is on chronic low-dose steroids for her pulmonary fibrosis.  No relief with her patches that she wears.  Patient is tender over C7.  No injury.  Does have radiation down the arm with would not go along with a radicular type symptom.  EKG reassuring and interpreted by me.  Doubt cardiac ischemia.  Chest x-ray and shoulder x-ray had been done and were independently interpreted by me.  No acute cause of the pain seen.  No rash but potentially could be an early shingles.  Discussed with patient and will add some muscle relaxers.  We will also increase steroid dose to 40 mg for 4 days.  Discussed with patient and her husband also.  We will take muscle relaxer and that does not help we will try the steroids.  Has had mildly elevated hemoglobin A1c and is on metformin.  Will watch sugar closely.  We will also call doctors about medicines.  Will discharge home.        Final Clinical Impression(s) / ED Diagnoses Final diagnoses:  Acute left-sided thoracic back pain    Rx / DC Orders ED Discharge Orders          Ordered    predniSONE (DELTASONE) 20 MG tablet  Daily        12/24/21 1952    methocarbamol (ROBAXIN) 500 MG tablet  Every 8 hours PRN        12/24/21 1952              Davonna Belling, MD 12/25/21 1416

## 2021-12-26 ENCOUNTER — Ambulatory Visit
Admission: RE | Admit: 2021-12-26 | Discharge: 2021-12-26 | Disposition: A | Discharge status: Home / Self Care | Payer: Medicare PPO | Source: Ambulatory Visit | Attending: Family Medicine | Admitting: Family Medicine

## 2021-12-26 ENCOUNTER — Other Ambulatory Visit: Payer: Self-pay | Admitting: Family Medicine

## 2021-12-26 DIAGNOSIS — M5412 Radiculopathy, cervical region: Secondary | ICD-10-CM

## 2022-01-01 ENCOUNTER — Other Ambulatory Visit: Payer: Self-pay | Admitting: Family Medicine

## 2022-01-01 DIAGNOSIS — E2839 Other primary ovarian failure: Secondary | ICD-10-CM

## 2022-01-06 ENCOUNTER — Ambulatory Visit (INDEPENDENT_AMBULATORY_CARE_PROVIDER_SITE_OTHER): Payer: Medicare PPO | Admitting: Internal Medicine

## 2022-01-06 ENCOUNTER — Encounter: Payer: Medicare PPO | Admitting: *Deleted

## 2022-01-06 ENCOUNTER — Other Ambulatory Visit: Payer: Self-pay

## 2022-01-06 ENCOUNTER — Encounter: Payer: Self-pay | Admitting: Internal Medicine

## 2022-01-06 VITALS — BP 120/60 | HR 83 | Temp 97.8°F | Ht 59.0 in | Wt 140.2 lb

## 2022-01-06 DIAGNOSIS — J679 Hypersensitivity pneumonitis due to unspecified organic dust: Secondary | ICD-10-CM

## 2022-01-06 DIAGNOSIS — K219 Gastro-esophageal reflux disease without esophagitis: Secondary | ICD-10-CM

## 2022-01-06 DIAGNOSIS — Z006 Encounter for examination for normal comparison and control in clinical research program: Secondary | ICD-10-CM

## 2022-01-06 DIAGNOSIS — K449 Diaphragmatic hernia without obstruction or gangrene: Secondary | ICD-10-CM

## 2022-01-06 DIAGNOSIS — J849 Interstitial pulmonary disease, unspecified: Secondary | ICD-10-CM

## 2022-01-06 DIAGNOSIS — Z7184 Encounter for health counseling related to travel: Secondary | ICD-10-CM

## 2022-01-06 LAB — PULMONARY FUNCTION TEST
DL/VA % pred: 75 %
DL/VA: 3.23 ml/min/mmHg/L
DLCO cor % pred: 53 %
DLCO cor: 8.62 ml/min/mmHg
DLCO unc % pred: 53 %
DLCO unc: 8.62 ml/min/mmHg
FEF 25-75 Pre: 2.41 L/sec
FEF2575-%Pred-Pre: 157 %
FEV1-%Pred-Pre: 81 %
FEV1-Pre: 1.43 L
FEV1FVC-%Pred-Pre: 119 %
FEV6-%Pred-Pre: 71 %
FEV6-Pre: 1.58 L
FEV6FVC-%Pred-Pre: 105 %
FVC-%Pred-Pre: 67 %
FVC-Pre: 1.58 L
Pre FEV1/FVC ratio: 90 %
Pre FEV6/FVC Ratio: 100 %

## 2022-01-06 NOTE — Research (Signed)
Title: Chronic Fibrosing Interstitial Lung Disease with Progressive Phenotype Prospective Outcomes (ILD-PRO) Registry    Protocol #: IPF-PRO-SUB, Clinical Trials # [id number], Sponsor: [employer]   Protocol Version Amendment 4 dated 12Sep2019  and confirmed current on  Consent Version for todays visit date of  Is [place] IRB Approved Version 27 Oct 2018 Revised 27 Oct 2018   Objectives:  Describe current approaches to diagnosis and treatment of chronic fibrosing ILDs with progressive phenotype  Describe the natural history of chronic fibrosing ILDs with progressive phenotype  Assess quality of life from self-administered participant reported questionnaires for each disease group  Describe participant interactions with the healthcare system, describe treatment practices across multiple institutions for each disease group  Collect biological samples linked to well characterized chronic fibrosing ILDs with progressive phenotype to identify disease biomarkers  Collect data and biological samples that will support future research studies.                                            Key Inclusion Criteria: Willing and able to provide informed consent  Age ? 30 years  Diagnosis of a non-IPF ILD of any duration, including, but not limited to Idiopathic Non-Specific Interstitial Pneumonia (INSIP), Unclassifiable Idiopathic Interstitial Pneumonias (IIPs), Interstitial Pneumonia with Autoimmune Features (IPAF), Autoimmune ILDs such as Rheumatoid Arthritis (RA-ILD) and Systemic Sclerosis (SSC-ILD), Chronic Hypersensitivity Pneumonitis (HP), Sarcoidosis or Exposure-related ILDs such as asbestosis.  Chronic fibrosing ILD defined by reticular abnormality with traction bronchiectasis with or without honeycombing confirmed by chest HRCT scan and/or lung biopsy.  Progressive phenotype as defined by fulfilling at least one of the criteria below of fibrotic changes (progression set point)  within the last 24 months regardless of treatment considered appropriate in individual ILDs:  decline in FVC % predicted (% pred) based on >10% relative decline  decline in FVC % pred based on ? 5 - <10% relative decline in FVC combined with worsening of respiratory symptoms as assessed by the site investigator  decline in FVC % pred based on ? 5 - <10% relative decline in FVC combined with increasing extent of fibrotic changes on chest imaging (HRCT scan) as assessed by the site investigator  decline in DLCO % pred based on ? 10% relative decline  worsening of respiratory symptoms as well as increasing extent of fibrotic changes on chest imaging (HRCT scan) as assessed by the site investigator independent of FVC change.     Key Exclusion Criteria: Malignancy, treated or untreated, other than skin or early stage prostate cancer, within the past 5 years  Currently listed for lung transplantation at the time of enrollment  Currently enrolled in a clinical trial at the time of enrollment in this registry       Clinical Research Coordinator / Research RN note : This visit for [facility] Rhonda [name] Subject [phone] with DOB: 28-Jan-1951 on 01/06/2022 for the above protocol is Visit/Encounter 7 and is for purpose of research.    Subject expressed continued interest and consent in continuing as a study subject. Subject confirmed that there was no change in contact information (e.g. address, telephone, email). Subject thanked for participation in research and contribution to science.      During this visit on 01/06/2022, the subject completed the blood work and questionnaires per the above referenced protocol. Please refer to the subject's paper source binder for further details.  Signed by [place] Assistant [facility]  [place], [place]  03:18 PM 01/06/2022

## 2022-01-06 NOTE — Patient Instructions (Addendum)
ICD-10-CM   1. Hypersensitivity pneumonitis ([place])  J67.9     2. ILD (interstitial lung disease) ([place])  J84.9     3. Gastroesophageal reflux disease, unspecified whether esophagitis present  K21.9     4. Hiatal hernia  K44.9     5. Travel advice encounter  Z71.84         Pulmonary Fibrosis is every so steadily worse over years but Feb 2023 PFT improved and similar to 1 year ago. Having sesasonal and gerd related fluctutations.   Currently on prednisone 5mg  per day - noted musculoskeletal issue and implications that long term prednisone is a risk facttor for this  Noted air travel to [facility] in March 2023  Plan  -gerd managmene by GI as before  - respect no rechallenge with esbriet or ofev (prior visit(  - Respect lack of interest in CellCept given side effect profile (prior visit) -Respect declining lung transplant evaluation [not consistent with the goals of care; prior visit] - continue weight loss - goal BMI 27   -go for low carb diet - continue o2 for pulse ox goal > 88% - stay on prednisome 5mg  per day - take o2 for for air travel on [employer] - ILD-PRO registry visit with Leda [facility] 01/06/2022    Followup -6 months to spirometry and DLCO and return to see Dr. Chase Caller for a face-to-face visit 30-minute slot

## 2022-01-06 NOTE — Progress Notes (Signed)
Spirometry and Dlco done today. 

## 2022-01-06 NOTE — Progress Notes (Addendum)
#GE reflux with small hiatal hernia  - on ppi   #History of rapid heart rate not otherwise specified  - Start her on Lopressor  2008/2009 by primary care physician. History appears to correlate with onset of respiratory issues  - refuses to dc this drug as of 2014 discussion   # Hypersensitivity Pneumonitis and ILD  - Potential etiologies - cockateil x 2 for 18 years through end 2012. 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 Denies mold but lives in house built in [place] with a "weird baselment: and has humidifier  - first noted on CT chest 10/15/09 following trip to [place] (PE negative) but not described in 2003 CT chest report  - autoimmune panel: 10/23/11: Negative  -  Uderwennt bronch 11/19/11  - non diagnostic  - VATS Nov 2013 (done after initially refusing)- CRONIC HYPERSENSITIVITY PNEUMONITIS WITH GRANULOMA. However, there is worrying trend of UIP pattern in the Upper lobes.         OV 06/17/2017  Chief Complaint  Patient presents with   Follow-up    [name]. Pt denies significant cough, denies CP/tightness, f/c/s.     She is better. Feels she does not need o2. Has been to [facility] for transplant clinic; felt too early. Seems higher dose prednisone helping but then she is also bettter t his time of the year. In May 2018 I was at [facility] and curbsided national thought leaders - feel that we could explore silent active GERD as a possibility of ILD getting wors with time.   OV 01/11/2018  Chief Complaint  Patient presents with   Follow-up    PFT done today.  Pt states she has been coughing x2 weeks since she returned from a trip to [place]. From coughing, pt has had pain in ribs. Pt states she has mild SOB which is stable due to rehab.  Pt states that Dr. Hal Hope is wanting to know if pt could possibly have eosinophilic esophagitis.   Went to [place]. Returned and has been  coughing x 2 weeks. Associated with this is some rib pain on right side. She feels she does not need o2. She feels she does not need pred/abx at this time. She also exposed to flu wit family   OV 02/08/2018  Chief Complaint  Patient presents with   Acute Visit    CXR done 02/07/18.  Pt states her throat is sore but not as bad as it was yesterday when in office and does have some left ear pain. Pt states she has some pain on right rib cage and has increased fatigue.     Ms. Jackowski presents acutely.  I last saw her one month ago gave her Tamiflu for prophylaxis after flu exposure.  At that point in time she was already reducing her chronic stable prednisone of 10 mg for interstitial lung disease/hypersensitivity pneumonitis.  She had tapered herself slowly 3 or 4 weeks ago to 5 mg.  She says she was doing fairly okay but in the last 2 or 3 weeks she has had increased fatigue.  She says she goes daily swimming and then feels energized but then an hour later starts having fatigue which is more than usual.  Also in the daytime she has random fatigue.  In terms of her respiratory symptoms these are stable and in fact slightly better in terms of shortness of breath and  cough but she is having sore throat without any fever and some left-sided otalgia.  In addition she is having right-sided chest pains that are more than usual.  The chest pains have been reported before.  These are specifically at localized spots along the previous incision several years ago for interstitial lung disease.  They are tender as well to touch.  This pain is worse.  In addition she is also being bothered by arthralgia in her hand and left hip.  The left hip arthralgias old with a hand arthralgias new.  She is worried about pneumothorax recurrence and so she had a chest x-ray yesterday that shows no pneumothorax.  She has stable interstitial lung disease changes but in my personal visualization these interstitial lung disease changes  are worse compared to several years ago.  We do know that she has slowly worsening interstitial lung disease.  In terms of a walking desaturation test this shows stability since last visit.  She has seen GI for acid reflux and has pH probe study coming up  In talking to her she tells me she has been on metformin for over a month or 2.  This is new and is meant for pre-diabetes.  Apparently her hemoglobin A1c is always below 6 but is above 5.5.  Walking desaturation test on 02/08/2018 185 feet x 3 laps on ROOM AIR:  did walk normal pace with forehead probe desaturate. Rest pulse ox was 99%, final pulse ox was 93%. HR response 72/min at rest to 93/min at peak exertion. Patient INGA NOLLER  Did not Desaturate < 88% . Aretha Parrot yes did  Desaturated </= 3% points. Aretha Parrot yes did get tachyardic   Dg Chest 2 View  Result Date: 02/07/2018 CLINICAL DATA:  Chest wall pain for 2 weeks EXAM: CHEST - 2 VIEW COMPARISON:  CT 08/20/2017, radiograph 10/11/2015 FINDINGS: Coarse interstitial pattern consistent with pulmonary fibrosis, similar distribution compared to prior. No focal pulmonary opacity or pleural effusion. Stable cardiomediastinal silhouette with aortic atherosclerosis. No pneumothorax. Degenerative changes of the spine. IMPRESSION: No active cardiopulmonary disease. Similar appearance of diffuse pulmonary fibrosis. Negative for a pneumothorax. Electronically Signed   By: Donavan Foil M.D.   On: 02/07/2018 14:26    OV 04/13/2018  Chief Complaint  Patient presents with   Follow-up    Pt had pre spiro and DLCO PFT prior to OV. Pt has increase of productive cough-thick white in last week. Pt has some wheezing, and allergy issues.   Ms. Freda Munro presents for routine follow-up.  I saw her acutely just approximately 2 months ago.  Then end of April 2019 she saw a nurse practitioner again acutely and was given antibiotic and prednisone.  She says she felt better after that but in the  last week or so due to increased pollen load in the community and also moving furniture because her daughter is relocating out of her home she started having more cough.  There is no change in her dyspnea with the cough is really bad.  This is despite Singulair and Chlor-Trimeton.  Those things to help but just take the edge off.  She feels that the pollen making the cough worse.  There is no change in dyspnea.  Walking desaturation test documented below his baseline.  She had pulmonary function test today but the [facility] shows significant decline compared to February 2019 and she feels surprised by this.  DLCO is baseline unchanged and a walking desaturation test limited in  the office is also unchanged.  She does not have any fever but has significant postnasal drip.  She has not followed up at [name] transplant clinic or the allergist recently.  OV 08/16/2018  Subjective:  Patient ID: Aretha Parrot, female , DOB: 11-08-1951 , age 29 y.o. , MRN: [id number] , ADDRESS: [address] [address]   08/16/2018 -   Chief Complaint  Patient presents with   Follow-up    PFT performed today.  Pt states she has had a lot of mucus the month of September 2019 so she has been taking the Chlor-Trimeton. States other than that she has been able to do a lot more activities and has been swimming a lot more. Pt states she believes the SOB is stable.     HPI KINDSEY EBLIN 71 y.o. -continues to do well.  She is taking higher dose of prednisone 10 mg/day.  Also the summer usually she is doing well.  She is not having much of a cough.  Lung function test shows improvement compared to tests done earlier this year.  In fact she is almost as good as much 2018.  Still compared to 2014-2016 she has progressive lung disease.  Walking desaturation test is stable.  She needs a high-dose flu shot but we do not have stock today.  She has lost some weight intentionally with better dietary control.  She is  exercising regularly swimming.  We discussed ofev  potential future therapy if the drug is approved for this indication.  We will know study results in a year or so.  She is open to the idea but is worried somewhat about her irritable bowel syndrome flaring up.  She is no longer following at the [facility] lung transplant clinic given stability lung function  Other issues: She always has right-sided chest wall pain to the site of lung biopsy.  The only way this is been resolved as by her limiting her use of bra .  Also her irritable bowel syndrome is under control but in case it flares up she wants a refill of dicyclomine     OV 12/15/2018  Subjective:  Patient ID: Aretha Parrot, female , DOB: 09-Feb-1951 , age 37 y.o. , MRN: [id number] , ADDRESS: [address] [place] [address]   12/15/2018 -   Chief Complaint  Patient presents with   Follow-up    Pt states she has been doing better since last visit with TP but states she has been having pain in her rib cage x2 years but states it has become more intense.     HPI ISABELLAH SOBOCINSKI 71 y.o. -returns for follow-up of her ILD due to hypersensitivity pneumonitis.  She did a clinic visit also for research with the ILD-pro registry study.  Since I last saw her in September 2019 she had a flareup and required higher doses of prednisone.  Since then she is returned to baseline of 5 mg prednisone and she feels good.  She is stable.  Walking desaturation test shows stability.  We have been discussing over the last few months about taking nintedanib based on new data and progressive non--IPF ILD's.  She has trepidation for this because of irritable bowel syndrome.  After much discussion she is willing to try 100 mg once daily for a month and then escalate to 100 mg twice daily which is the therapeutic dose.  She will do this once after insurance approval which we suspect will  happen over the next few months.  Her main issue is that her right  sided infra-axillary chest pain is getting worse.  This started after her surgical lung biopsy and pneumothorax on the right side.  It is random and happens with twisting.  But now days it happens once every few days.  Previously it used to happen once every few weeks.  Sometimes it is excruciating but then she is left in pain for a few days.  Applying Abrol sudden twisting of her body makes it worse.  Is a lancinating pain.  She did try gabapentin some years ago for chronic cough .  She did tolerate the gabapentin well.  She does not remember if it helped the pain or not but clearly back and the pain was much less intense.  She is willing to try this again.  I recommended a CT scan of the chest but she is going to have radiation for a CT coronary angiogram because of high levels of coronary artery calcification.  Therefore written to the radiologist to see if he can look at the lung parenchyma with a CT angiogram    OV 05/09/2019  Subjective:  Patient ID: Aretha Parrot, female , DOB: 1951/05/14 , age 22 y.o. , MRN: [id number] , AD[address]ESS: [address] [place] [address]   05/09/2019 -   Chief Complaint  Patient presents with   ILD (Interstitial Lung Disease)     HPI YAZMEEN WOOLF 71 y.o. -follow-up for chronic care physician pneumonitis on daily 5 mg prednisone.  She has problems with right-sided chest wall pain that is neuropathic.  She tells me since her last visit January 2020 she has been isolating and social distancing because of the pandemic.  Overall she is been stable.  Her symptom scores are mild and documented below.  She continues with prednisone.  She was supposed to have pulmonary function test but this was held because of the pandemic.  Her walking desaturation test shows she is stable.  She is supposed to have pulmonary function test tomorrow but wanted to see me today.  In the interim she did have a cardiac CT scan of the chest that shows 94th percentile of coronary calcium.   She could not get a CT angiogram done because of PVC.  A nuclear medicine stress test was done and I reviewed this result and it was normal.  A Holter test is pending.  She is kind of nervous because of the ongoing COVID-19 situation and safety of doing a Holter test.  In terms of her right chest wall pain she is adapted to it.  She does not use any bra  OV 08/22/2019  Subjective:  Patient ID: Aretha Parrot, female , DOB: 11/03/1951 , age 70 y.o. , MRN: [id number] , AD[address]ESS: [address] [place] [address]   08/22/2019 -   Chief Complaint  Patient presents with   Follow-up    needs sx clearance for L hip replacement- not yet scheduled, pending clearance.     Patient lung disease chronic hypersensitive pneumonitis on chronic daily prednisone 5 mg/day  HPI ELVIS BOOT 71 y.o. -presents for follow-up of her interstitial lung disease due to chronic hypersensitive pneumonitis.  After last visit in June 2020 given her PFT stability and also her aversion to the potential side effects with nintedanib she decided to just continue with prednisone daily 5 mg/day.  Given the pandemic she is walking 1 mile a day without stopping.  Her  interstitial lung disease symptom score is actually slightly better.  Overall she reports stability in her interstitial lung disease.  She has a new issue of getting preoperative clearance for her left hip.  She says the left hip is bone-on-bone and she does not want to take Aleve.  She prefers to have surgery.  Dr. Paralee Cancel is the surgeon.  Surgery date has not been set.  She is very functional.  She has normal renal function.  Normal nutritional status.  No anemia.  No respiratory infections in the last 1 month.  She is not on oxygen.  Age 67.  The surgeries in the left hip and the duration of surgery is under 3 hours.  Anesthesia will be general.      OV 01/30/2020  Subjective:  Patient ID: Aretha Parrot, female , DOB: 11-12-1951 , age 53 y.o. , MRN:  [id number] , AD[address]ESS: [address] [address] Patient lung disease chronic hypersensitive pneumonitis on chronic daily prednisone 5 mg/day  01/30/2020 -   Chief Complaint  Patient presents with   Follow-up    PFT performed 3/8.  Pt states she believes she has been going downhill for the past 2 months. Pt was started on O2 at last OV and wears it prn. Pt does have an occ cough.     HPI MEGGAN DHALIWAL 71 y.o. -presents for follow-up of interstitial lung disease secondary to chronic hypersensitive pneumonitis.  She maintains herself on prednisone 5 mg/day.  In the interim she has had hip surgery and this did wonders for her.  She is able to walk longer distances.  However she does notice that her dyspnea has declined.  She says since January 2021 his symptoms have declined particularly with cough.  Usually in the winter she goes through a cycle of severe cough and exacerbation that requires higher levels of prednisone.  She has been social distancing well and masking well.  She states there is absolutely no mold or water any antigen exposure at home.  But she feels it is her allergies acting up and making her more symptomatic.  She has had a Covid vaccine and is wondering about either going back to swimming or starting pulmonary rehabilitation.  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  In fact when we walked her today with the mask she did drop down to 89%.  This is a decline for her.  She had pulmonary function test and this shows significant decline in FVC and DLCO.  In review of this that.  Where she has declined this much but is always bounce back with steroids.  At last visit we discussed nintedanib for her as a way to prevent progression of her ILD but given her concerns of GI side effects and the presence of irritable bowel syndrome she is declined.  Infective have this conversation with her  including her joining the [employer] at [facility] but she has always been worried about the GI side effects with nintedanib.  This time she is more open to it because of the decline in lung function.  She wants to see an allergist  She also told me that she continues to have a restrictive chest pain particularly on the right side lower area.  She no longer wears a brassiere it has been nearly 3 years since she had a high-resolution CT chest.  She is  open to having one now.  Last liver function test was normal in January 2020.  Last renal function was normal in November 2020.      OV 03/19/2020  Subjective:  Patient ID: Aretha Parrot, female , DOB: 1951/02/06 , age 34 y.o. , MRN: [id number] , [address]ESS: [address] [place] [address]  Chronic hypersensitive pneumonitis interstitial lung disease on prednisone daily 10 mg 03/19/2020 -   Chief Complaint  Patient presents with   Follow-up    Pt states she is doing better since last visit and states her breathing has also improved.     HPI TOMMA EHINGER 71 y.o. -at last visit in March 2021 there was decline in lung function.  Therefore we started nintedanib.  She took 1 tablet of 150 mg and then had immediate abdominal pain and fever.  Therefore she is decided against taking this.  I also support this decision because I do not think she will tolerate nintedanib.  We did extensive interstitial lung disease question a history again at that time.  She had taken 1 dose of nitrofurantoin.  Have indicated to her that she should never take this medicine again.  In addition discovered that she might have some feather jackets at home she has now gotten rid of it.  She is now doing pulmonary rehabilitation.  Her overnight oxygen desaturation test is negative.  At pulmonary rehabilitation she tells me she does not drop below 91% and 1 time she went to 89%.  Otherwise overall doing great.  Her cardiologist is increased her Lopressor.  Her daughter is getting  married and she is busy with the wedding planning this is being emotionally stressful for her.  Her allergist has increased her recommendations and this is also helping her.  Her allergist is retiring and she wanted recommendations for new allergist.  I have recommended Dr. Fredderick Phenix and Dr. Remus Blake.  Incidentally that right-sided chest pain that she had is also better with increased prednisone of 10 mg/day.  She feels prednisone is working really well for her.    OV 08/29/2020   Subjective:  Patient ID: Aretha Parrot, female , DOB: [birth year], age 8 y.o. years. , MRN: [id number],  ADDRESS: [address] [address] PCP  Hayden Rasmussen, MD Providers : Treatment Team:  Attending Provider: Brand Males, MD Patient Care Team: Hayden Rasmussen, MD as PCP - General (Family Medicine) Minus Breeding, MD as PCP - Cardiology (Cardiology) Brand Males, MD as Consulting Physician (Pulmonary Disease) Verl Blalock, Marijo Conception, MD (Inactive) (Cardiology) Kennith Center, RD as Dietitian (Family Medicine) Minus Breeding, MD as Consulting Physician (Cardiology)  Follow-up chronic hypersensitive pneumonitis on chronic prednisone 10 mg/day.  Last high-resolution CT chest March 2021.  Intolerant to nintedanib.  Chief Complaint  Patient presents with   Follow-up    Pt states she had been doing well since last visit up until 3-4 weeks ago and started having problems with her breathing and has had to use her O2 with activities. Pt also has been coughing a lot and is getting up clear phlegm. Pt also has had some mild wheeze.       HPI DUCHESS ARMENDAREZ 71 y.o. -returns for follow-up.  Since I last saw her her daughter is now married.  She had a great wedding.  She was able to enjoy this and dance quite well.  She barely used her oxygen her effort tolerance was good.  Some 3 [place]  Apparently she had difficulty getting her oxygen tank  to clinic to the electrical port.  She noticed with exertion a pulse ox dropping to the 70s.  She said even before she left a few days prior to that she started having increasing cough.  She notices that these cough cycles get exacerbated in the fall in the winter.  Looking back at her pulmonary function test there is always a dip between October and March each year.  Between 2015 and 2017 there was a decline and since then there is a waxing and waning quality with dips in the winter in the fall season.  Each time she responds to prednisone.  Most recent pulmonary function test in August 2021 is actually improved compared to March 2021.  However today she is feeling worse.  She feels something is in her airway.  She feels dry air in the house and the animals bringing leaves from outside is contributing.  She says she feels better upstairs than downstairs where the dogs.  She again denies any mold or mildew.  Denies any feather jacket exposure.  She feels she will benefit from another round of prednisone.  She feels in the past antibiotics have not helped her as much as prednisone.   In terms of chronic ILD HP: She not tolerate nintedanib in the past.  I explained to her that I am concerned about progression and her decline in functional quality and the risk for that.  We discussed pirfenidone as an alternative.  Discussed the fact is less well studied although have no reason to see why it would not be effective.  Explained the side effect profile.  She is willing to try this.  We decided to go donor samples from the patient support group  Oxygen qualification: We walked her and she qualified for oxygen today.  Is for portable oxygen with [employer]  Other issue: Spring allergies: -Her allergist is retired.  We discussed options of referral.  Referred her to Dr. Hardie Pulley referred to     OV 01/14/2021  Subjective:  Patient ID: Aretha Parrot, female , DOB: Jan 31, 1951 , age 27 y.o. , MRN: [id number] ,  ADDRESS: [address] [address] PCP Hayden Rasmussen, MD Patient Care Team: Hayden Rasmussen, MD as PCP - General (Family Medicine) Minus Breeding, MD as PCP - Cardiology (Cardiology) Brand Males, MD as Consulting Physician (Pulmonary Disease) Verl Blalock, Marijo Conception, MD (Inactive) (Cardiology) Kennith Center, RD as Dietitian (Family Medicine) Minus Breeding, MD as Consulting Physician (Cardiology)  This Provider for this visit: Treatment Team:  Attending Provider: Brand Males, MD    01/14/2021 -   Chief Complaint  Patient presents with   Follow-up    Nauseous, loss of appetite    Follow-up chronic hypersensitive pneumonitis on prednisone 5 mg/day and also pirfenidone submaximal dose of 2 pills 3 times daily since October 2021.  On ILD-pro registry protocol  - failed ofev   - failed esbrit due to side effects Faythe Ghee 2022  She is also on Breo  HPI GAYLE MARTINEZ 71 y.o. -returns for follow-up.  She is now on pirfenidone along with low-dose prednisone.  She feels the pirfenidone is not reacting well with her at all.  She has intermittent loss of taste at least twice a week.  She has loss of appetite and she has forces herself to eat.  She also has fatigue.  She says that if  I strongly recommended then she will continue to soldier on with it.  Otherwise she feels less short of breath.  Pulmonary function test reviewed and is actually stable/slightly improved.  She says the cough is extremely well controlled except today after the pulmonary function test she was coughing quite a bit.  She is looking forward to going back to swimming.  She has had a Covid vaccine in booster and also status post EVUSHELD monoclonal antibody prophylaxis.  We discussed the possibility about possibility of enrolling in inhaled nitric oxide device/administration study.  This is to see if people can improve in the shortness of breath scale and also physical activity.  She took the consent form and  is deliberating.  She thinks she might find the study and inconvenience to her lifestyle where she is wanting to be more active.  We went over several details of this research protocol  She has a new symptom of like itching sensation deep in the right flank.  She feels it is not the skin it is from deep inside.  She is wondering if it could be related to pirfenidone.  Started after going on pirfenidone.  Explained to her that I am not seeing this in other patients with pirfenidone but will have to be open minded.  She does not apply Vaseline to her skin in the winter.  We discussed the possibility this could be dry skin related but she does not have other features of dry skin.  Nevertheless she is willing to try Vaseline.   Results for CHAPP   OV 07/08/2021  Subjective:  Patient ID: Aretha Parrot, female , DOB: [birth year] , age 47 y.o. , MRN: [id number] , ADDRESS: [address] [place] [address] PCP Hayden Rasmussen, MD Patient Care Team: Hayden Rasmussen, MD as PCP - General (Family Medicine) Minus Breeding, MD as PCP - Cardiology (Cardiology) Brand Males, MD as Consulting Physician (Pulmonary Disease) Verl Blalock, Marijo Conception, MD (Inactive) (Cardiology) Kennith Center, RD as Dietitian (Family Medicine) Minus Breeding, MD as Consulting Physician (Cardiology)  This Provider for this visit: Treatment Team:  Attending Provider: Brand Males, MD    07/08/2021 -  ACUTE VISIT Chief Complaint  Patient presents with   Acute Visit    Pt states that she did start taking zpak yesterday 8/15 and said her cough is doing some better after being on second day of abx and said that she started prednisone today 8/16. States that she still has increased SOB. Pt did take a covid test which came back negative.   Follow-up chronic hypersensitive pneumonitis on prednisone 5 mg/day and also pirfenidone submaximal dose of 2 pills 3 times daily since October 2021.  On ILD-pro registry protocol -  failed ofev   - failed esbrit due to side effects marh 2022  She is also on Breo   HPI JUDIE HOLLICK 71 y.o. -this is an acute visit.  Last seen in February 2022 after that she was supposed to see me back in 3 months.  But this visit has been scheduled acutely.  She tells me that early in May 2022 she went to [facility].  They rented a house.  She believes that the bedroom had some mold.  She did not visually confirmed the mold but husband might have.  She says shortly after going there and spending a week she started having worsening cough with hoarseness of voice.  This cough is persisted all along and the hoarseness has persisted as  well.  The in June 2022 she had a short course of steroids that did not help.  She believes the dosing was not strong enough.  In July 2022 she had some back issues and was given 30 mg of prednisone for approximately 10 days with a taper and this helped her back.  But all along the cough is persisted and severe hoarseness is persisted.  Then approximately a week ago she started getting shortness of breath along with yellow sputum.  The cough got worse .  She states even at baseline she brings her a lot of white sputum and has to cough a lot.  [place] and a couple of days into the illness.  At this point in time she started noticing she was more easily desaturating.  She is not able to swim.  She says walking to the car her pulse ox dropped to 84%.  Then the yellow phlegm started getting worse.  She called in yesterday and we prescribed Z-Pak and a prednisone burst.  She says now with the Z-Pak the sputum color is improving.  A week ago she also saw Dr. Blenda Nicely for chronic cough.  100 mg 3 times daily of gabapentin has been prescribed.  She is slowly increasing it.  She is worried about the side effects of this drug.  Apparently her vocal cord had ulcers from repeated coughing.  ILD symptom score shows worsening.  In the office  she is very hoarse   Of note she stopped her pirfenidone in February/March 2022 because of side effects - gerd  She uses oxygen with exertion.  There is no leg swelling or hemoptysis.  Started z pak yesterday Started 12d pred taper yesterday     PFT   OV 08/14/2021  Subjective:  Patient ID: Aretha Parrot, female , DOB: 08/22/1951 , age 67 y.o. , MRN: [id number] , AD[address]ESS: [address] [place] [address] PCP Hayden Rasmussen, MD Patient Care Team: Hayden Rasmussen, MD as PCP - General (Family Medicine) Minus Breeding, MD as PCP - Cardiology (Cardiology) Brand Males, MD as Consulting Physician (Pulmonary Disease) Verl Blalock, Marijo Conception, MD (Inactive) (Cardiology) Kennith Center, RD as Dietitian (Family Medicine) Minus Breeding, MD as Consulting Physician (Cardiology)  This Provider for this visit: Treatment Team:  Attending Provider: Brand Males, MD  Follow-up chronic hypersensitive pneumonitis on prednisone 5 mg/day and also pirfenidone submaximal dose of 2 pills 3 times daily since October 2021.  On ILD-pro registry protocol - failed ofev   - failed esbrit due to side effects Faythe Ghee 2022  She is also on Breo  08/14/2021 -   Chief Complaint  Patient presents with   Follow-up    Pt states she is feeling better since last visit. States she did receive her lighterweight POC which has been working well for her.     HPI BREONA CHERUBIN 71 y.o. -seen last month with worsening symptoms acute bronchitis/flare.  Given Z-Pak and prednisone.  She is significantly better.  She tells me that even though she is better and his subjective symptom score is better below.  She still feels not back to her May 2022 baseline.  She says in the neighborhood she has to use more oxygen.  She is interested in a backpack for oxygen.  She is not able to swim as well as she used to.  She says acid reflux is under better control now after seeing Dr. Blenda Nicely  in ENT.  She is wondering  about starting pirfenidone she definitely does not want to do nintedanib again.  But even with pirfenidone she is reluctant.  We discussed CellCept because of progression but because of the immunosuppression she is reluctant.  We resolved that she would increase the prednisone to 10 mg/day and reassess.  She was supposed to have done a spirometry today but it did not happen.  She will have it done again in 4 weeks.  Have requested a schedule for that.    The main concern right now even though is better is that she is not to her baseline as of a year ago and she feels like she is having slowly progressive disease interstitial lung disease/chronic HP.       OV 10/03/2021  Subjective:  Patient ID: Aretha Parrot, female , DOB: Dec 04, 1950 , age 71 y.o. , MRN: [id number] , AD[address]ESS: [address] [address] PCP Hayden Rasmussen, MD Patient Care Team: Hayden Rasmussen, MD as PCP - General (Family Medicine) Minus Breeding, MD as PCP - Cardiology (Cardiology) Brand Males, MD as Consulting Physician (Pulmonary Disease) Verl Blalock, Marijo Conception, MD (Inactive) (Cardiology) Kennith Center, RD as Dietitian (Family Medicine) Minus Breeding, MD as Consulting Physician (Cardiology)  This Provider for this visit: Treatment Team:  Attending Provider: Brand Males, MD    10/03/2021 -   Chief Complaint  Patient presents with   Follow-up    Pt states her breathing has become worse since last visit. States she is having to use her O2 almost all the time now with exertion.    Follow-up chronic hypersensitive pneumonitis on prednisone 5 mg/day and also pirfenidone submaximal dose of 2 pills 3 times daily since October 2021.  On ILD-pro registry protocol - failed ofev due to side efect  - failed esbrit due to sde effects marvh 2022  -Last echo 2018  -Last high-res CT March 2021. -> Nov 2022 stable between 2  She is also on Breo  HPI NOA GALVAO 70 y.o. -returns for  follow-up.  In this visit she is categorically saying that she is significantly worse.  This point in particular relationship to dyspnea on exertion.  Her weight itself is stable.  She is currently on 5 mg prednisone per day [last time I thought I increased it to 10 mg/day].  Nevertheless she says other interim changes is that she has had slow weight gain.  BMI is 30.  She started semaglutide for hemoglobin A 1.C 6.5.  Is also to help weight.  She was also after seeing Dr. Blenda Nicely for her cough and significant amount of anti-PPI therapy.  She said this made her diarrhea worse.  She then reduced it and currently is taking omeprazole 20 mg/day in the daytime and 40 mg at night and in between Pepcid as well.  She stopped the gabapentin.  She feels after reducing the PPI the diarrhea is improved.  The cough is not as bad as it was even after stopping gabapentin but she continues to be fatigued.  She complains of bloating.  She feels her stomach is full.  She feels acid reflux is worse.  Acid reflux is worse after reducing the dose of H2 blockade.  She also has early satiety.  She also states when she coughs she vomits.  She feels she needs to see Dr. Ethlyn Gallery.  I have messaged him and he hasagreed for him or his PA to see her soon.  Nevertheless  symptoms are significantly worse as seen on the symptom score below.  She is also reporting increased tachycardia with exertion and apparently Dr. Percival Spanish adjusted her beta-blocker.  She has upcoming appointment with them.    She says that at times when she desaturates easily at home even for minimal exertion.  We walked her today with a forehead probe and she actually did much better than expected going down to 89% only at the end of 3 labs [this is actually an improvement from the recent past versus stability].  She might have an erratic finger pulse ox probe at home.   Her pulmonary function shows a significant decline in DLCO.  Last echocardiogram 2018.  Last  high-resolution CT chest March 2021.  Never had right heart catheterization.  Hemoglobin normal 14.7 g% in August 2022.           OV 10/21/2021  Subjective:  Patient ID: Aretha Parrot, female , DOB: 04-Dec-1950 , age 24 y.o. , MRN: [id number] , AD[address]ESS: [address] [address] PCP Hayden Rasmussen, MD Patient Care Team: Hayden Rasmussen, MD as PCP - General (Family Medicine) Minus Breeding, MD as PCP - Cardiology (Cardiology) Brand Males, MD as Consulting Physician (Pulmonary Disease) Verl Blalock, Marijo Conception, MD (Inactive) (Cardiology) Kennith Center, RD as Dietitian (Family Medicine) Minus Breeding, MD as Consulting Physician (Cardiology)  This Provider for this visit: Treatment Team:  Attending Provider: Brand Males, MD  Type of visit: Video Circumstance: COVID-19 national emergency Identification of patient SAMARAH HOGLE with 1951-01-15 and MRN [id number] - 2 person identifier Risks: Risks, benefits, limitations of telephone visit explained. Patient understood and verbalized agreement to proceed Anyone else on call: no Patient location:  her home This provider location: [address]; [place]; [place] [address]. [facility]. [phone]    10/21/2021 -     Follow-up chronic hypersensitive pneumonitis on prednisone 5 mg/day and also pirfenidone submaximal dose of 2 pills 3 times daily since October 2021.  On ILD-pro registry protocol - failed ofev due to side efect  - failed esbrit due to sde effects marvh 2022  -Last echo 2018  -Last high-res CT March 2021.  She is also on Breo HPI DELAYNI STREED 71 y.o. -returns for follow-up via video visit to discuss test results.  She says currently she is just doing PPI lower dose 20 mg omeprazole in the daytime and famotidine at night.  With this her diarrhea is better.  Also bloating is better.  Correlating with this for she feels her lungs are stable.  She does say  that even now when she bends down she can desaturate.  She is not using oxygen at night.  Her primary care is testing her over no at night to see if she really needs it at night.  Nevertheless she feels somewhat better.  She had echocardiogram because of concern of pulmonary hypertension and there is no evidence of pulmonary hypertension.  She had high-resolution CT chest which the radiologist was described as minimally progressive since 18 months earlier in March 2021.  She had pulmonary function test that shows the FVC to be stable in the last few to several months but the DLCO to show slight decline.  Nevertheless the pattern at least compared to 2 years ago is 1 of progressive ILD on the pulmonary function test.  Certainly the hypoxia with easy exertion compared to a few years ago or a year ago is also a  sign that her ILD is getting worse.  She is likely stable or stabilized in the last few to several months.  We discussed going challenge again with nintedanib or pirfenidone but she is not interested.  We discussed doing CellCept and discussed the side effect profile of this.  She is not interested.  We discussed transplant referral for evaluation now that she is progressing so she can get plugged in and have a good conversation.  She is reflected on this from the last visit and she is not interested  We discussed the acid reflux controlled she feels she is at a good place right now with good acid reflux controlled with PPI in the morning and H2 blockade at night.  She has seen GI and at this point I believe they are not going to do pH probe.  I reviewed the note.  She continues on Breo and prednisone 5 mg/day which she plans to continue.  She has heard about the promedior trial with IV Pnetraxin for IPF.  This might be a study for non-- IPF progressive phenotype.  She prefers to do the study because it bypasses the GI route and it is an IV study.  We do not know if the study is going to happen.  We do  not know if you are going to be a site.  If these things all get aligned then maybe we could consider that.     PFT  PFT Results Latest Ref Rng & Units 10/03/2021 01/14/2021 11/06/2020 07/18/2020 01/29/2020 05/10/2019 08/16/2018  FVC-Pre L 1.48 1.49 1.41 1.47 1.39 1.62 1.65  FVC-Predicted Pre % 63 62 59 62 57 67 67  FVC-Post L - - - - - - -  FVC-Predicted Post % - - - - - - -  Pre FEV1/FVC % % 92 92 93 88 91 89 90  Post FEV1/FCV % % - - - - - - -  FEV1-Pre L 1.37 1.37 1.31 1.30 1.26 1.45 1.49  FEV1-Predicted Pre % 78 77 73 73 69 80 81  FEV1-Post L - - - - - - -  DLCO uncorrected ml/min/mmHg 8.92 11.26 9.70 11.19 9.36 10.32 9.21  DLCO UNC% % 55 69 59 68 57 63 52  DLCO corrected ml/min/mmHg 8.92 11.26 10.53 11.19 9.36 - -  DLCO COR %Predicted % 55 69 64 68 57 - -  DLVA Predicted % 78 92 89 90 81 93 88  TLC L - - - - - - -  TLC % Predicted % - - - - - - -  RV % Predicted % - - - - - - -    ELL, Deanie A (MRN [id number]) as of 08/22/2019 11:56  Ref. Range 12/06/2013 13:03 03/15/2014 15:56 09/18/2014 11:41 02/26/2015 09:02 12/16/2015 16:31 04/29/2016 12:44 01/25/2017 10:16 01/11/2018 15:19 04/13/2018 08:47 08/16/2018 09:00 05/10/2019 09:42 3/8?21 07/18/20  FVC-Pre Latest Units: L 1.86 1.91 1.73 1.84 1.42  1.69 1.61 1.35 1.65 1.62 1.39 1.47  FVC-%Pred-Pre Latest Units: % 69 71 64 72 56 67 65 62 55 67 67 57% 62%   Results for Lad, Nusrat A (MRN [id number]) as of 08/22/2019 11:56  Ref. Range 12/06/2013 13:03 03/15/2014 15:56 09/18/2014 11:41 02/26/2015 09:02 12/16/2015 16:31 04/29/2016 12:44 01/25/2017 10:16 01/11/2018 15:19 04/13/2018 08:47 08/16/2018 09:00 05/10/2019 09:42 01/29/20 07/18/20  DLCO unc Latest Units: ml/min/mmHg 11.99 11.70 11.53 5.52 10.06 12.12 10.43 10.57 10.16 9.21 10.32 9.36 11.19  DLCO unc % pred Latest Units: % 63 62 61 31 57 64 55  56 57 52 63 57% 68%    ECHO 10/15/21  IMPRESSIONS     1. Left ventricular ejection fraction, by estimation, is 55 to 60%. The  left ventricle has normal function.  The left ventricle has no regional  wall motion abnormalities. Left ventricular diastolic parameters are  consistent with Grade I diastolic  dysfunction (impaired relaxation).   2. Right ventricular systolic function is normal. The right ventricular  size is normal. There is normal pulmonary artery systolic pressure. The  estimated right ventricular systolic pressure is 90.2 mmHg.   3. The mitral valve is normal in structure. Mild mitral valve  regurgitation.   4. The aortic valve is tricuspid. There is mild thickening of the aortic  valve. Aortic valve regurgitation is trivial.   5. The inferior vena cava is normal in size with greater than 50%  respiratory variability, suggesting right atrial pressure of 3 mmHg.    CT HIGH RES 10/10/21  Narrative & Impression  CLINICAL DATA:  71 year old female with history of worsening shortness of breath. Interstitial lung disease. Follow-up study.   EXAM: CT CHEST WITHOUT CONTRAST   TECHNIQUE: Multidetector CT imaging of the chest was performed following the standard protocol without intravenous contrast. High resolution imaging of the lungs, as well as inspiratory and expiratory imaging, was performed.   COMPARISON:  High-resolution chest CT 02/07/2020.   FINDINGS: Cardiovascular: Heart size is normal. There is no significant pericardial fluid, thickening or pericardial calcification. There is aortic atherosclerosis, as well as atherosclerosis of the great vessels of the mediastinum and the coronary arteries, including calcified atherosclerotic plaque in the left main, left anterior descending, left circumflex and right coronary arteries.   Mediastinum/Nodes: Mediastinal or no pathologically enlarged hilar lymph nodes. Small hiatal hernia. No axillary lymphadenopathy.   Lungs/Pleura: High-resolution images again demonstrate widespread but patchy areas of ground-glass attenuation, septal thickening, subpleural reticulation,  cylindrical bronchiectasis and scattered areas of honeycombing. These findings have no discernible craniocaudal gradient, and appear minimally progressive compared to the prior examination. Inspiratory and expiratory imaging again demonstrates extensive air trapping indicative of small airways disease. No acute consolidative airspace disease. No pleural effusions. No definite suspicious appearing pulmonary nodules or masses are noted.   Upper Abdomen: Aortic atherosclerosis.   Musculoskeletal: There are no aggressive appearing lytic or blastic lesions noted in the visualized portions of the skeleton.   IMPRESSION: 1. The appearance of the lungs is very similar to the prior study with minimal progression, and a spectrum of findings once again categorized as most compatible with an alternative diagnosis (not usual interstitial pneumonia) per current ATS guidelines. Findings are once again favored to represent chronic hypersensitivity pneumonitis. 2. Aortic atherosclerosis, in addition to left main and 3 vessel coronary artery disease. Assessment for potential risk factor modification, dietary therapy or pharmacologic therapy may be warranted, if clinically indicated.   Aortic Atherosclerosis (ICD10-I70.0).     Electronically Signed   By: Vinnie Langton M.D.   On: 10/13/2021 11:32       OV 01/06/2022  Subjective:  Patient ID: Aretha Parrot, female , DOB: June 13, 1951 , age 4 y.o. , MRN: [id number] , [address]ESS: [address] [address] PCP Hayden Rasmussen, MD Patient Care Team: Hayden Rasmussen, MD as PCP - General (Family Medicine) Minus Breeding, MD as PCP - Cardiology (Cardiology) Brand Males, MD as Consulting Physician (Pulmonary Disease) Verl Blalock, Marijo Conception, MD (Inactive) (Cardiology) Kennith Center, RD as Dietitian (Family Medicine) Minus Breeding, MD as Consulting Physician (Cardiology)  This Provider for this visit: Treatment Team:  Attending  Provider: Brand Males, MD    01/06/2022 -   Chief Complaint  Patient presents with   Follow-up    PFT performed today.  Pt states she is doing much better compared to last visit.     Follow-up chronic hypersensitive pneumonitis on prednisone 5 mg/day and also pirfenidone submaximal dose of 2 pills 3 times daily since October 2021.  On ILD-pro registry protocol - failed ofev due to side efect  - failed esbrit due to sde effects marvh 2022  -Last echo 2018  -Last high-res CT March 2021.=> November 2022 with stability between the 2.  She is also on Breo  HPI PARISS HOMMES 71 y.o. -returns for follow-up.  She says she is doing better.  She feels that with fall season, rainy weather and any respiratory exacerbations she goes downhill but then she responds after the prednisone burst.  She does not like the idea of the repeated prednisone.  Recently she ended up in the ER with neck pain and required a prednisone burst.  She is worried about intermittent frequency of prednisone burst.  I did indicate to her that more likely chronic prednisone that I am giving her for chronic HP is a detriment to her bones.  In any event she is better.  #4401 CT scan of the chest did not show any progression compared to March 2021.  Pulmonary function test most recently shows a bounce back and is similar compared to the last 6/12 months but definitely compared to the last several years there is progression.  I did indicate to her that she just has chronic progressive ILD progressive phenotype with fluctuations because of respiratory exacerbations.  Is because of hypersensitive pneumonitis involving the airways and intermittently behaving like an asthma phenotype.  She understood this.  At this point in time she is not interested in interventional clinical trials or even taking nintedanib or pirfenidone.  Of note she also attributes some of her improvement to taking her medications with a better timing.  She is  also made some changes with the medications where she skipped some of her allergy medications.  She believes all this is helping her.  She is focused on weight loss.  And also improvement of her hemoglobin A1c.  She is on diabetic drug which is made her lose 10 pounds of weight.  She says this drug is also resolved her GI issues.  She is a healthy 10 pound weight loss.  Her symptom profile shows improvement.   She had overnight pulse oximetry study done by Dr. Horald Pollen primary care time spent less than 88% was 6.3 minutes.  We took a shared decision to hold off on restarting night oxygen.  CT Chest data Nov 202  No results found.   SYMPTOM SCALE - ILD Sept 2020 01/30/2020  08/29/2020  01/14/2021 146# 07/08/2021 147# - off esbriet. Sic past week 08/14/2021 149# - pred only. No antifibrotic 10/03/2021 148# - pred 18m 01/06/2022 Prednisone 5 mg/day  O2 use  o2 with ex o2 withe ex 2L     2LL o2 with ex   Shortness of Breath  0 -> 5 scale with 5 being worst (score 6 If unable to do)        At rest 0 0 1 0 1 0 1 0  Simple tasks - showers, clothes change, eating, shaving 01 1 1 1._0 Household (dishes, doing bed, laundry)  0 _0 3.5 2  Shopping _1 1.5  Walking level at own pace _2 Walking up Stairs _3 Total (30-36) Dyspnea Score _4 10._5 14.5 9.5  How bad is your cough? _6 How bad is your fatigue _7 0 3 2  How bad is nausea  0 0 3 0 0 1 1  How bad is vomiting?   0 0 0 0 0 1 0  How bad is diarrhea?  00 1 0 0 0 1 00  How bad is anxiety?  0 0 0 00 0 2   How bad is depression  0 _8 0 1 0  0   Simple office walk 185 feet x  3 laps goal with forehead probe 04/13/2018  08/16/2018  12/15/2018  05/09/2019  08/22/2019  01/30/2020  08/29/2020  01/14/2021  07/08/2021  08/14/2021  10/03/2021   O2 used _9  Room air _10   Number laps completed _11 stopped at  2 die to hip pain 3 laps - no hip pain following hip surgery _12 attempted byt did  only 2 3 bu stopped at 2 3 and did all 3  Comments about pace good Moderate pace Normal, hip bothering     avg pace avg  avg  Resting Pulse Ox/HR 98% and 73/min 98% and HR 77/min 99% and HR 61/min 98% and HR 70/min 98% 98% and 75/min 97% and 67/mi 94% and 80/min 96% and HR 72 98% and 71 100% and HR 82  Final Pulse Ox/HR 91% and 91/min 93% and 92.min 94% and 92/min 93% and 98/min 91%  89% and 93/,imn 88% and 94 89% ad 88.nin 88% at 2nd  lap end HR 92 86% and 96 89% and HR 91  Desaturated </= 88% no no no no         Desaturated <= 3% points yes yes Yes, 5 points Yes, 5 points    Yes, 5 points Yes, 8 points Yes, 12 pints Yes 11 pot  Got Tachycardic >/= 90/min yes yes yes yes     yes yes yes  Symptoms at end of test none none Hip pain and very mild dyspnea  Stopped due to hip pain Moderate duyspnea with mask Mild to moderate dyspnea  Severe dyspnea Mild dyspnea Severe dyspnea  Miscellaneous comments x x    No hip pain    Similar to lastime Symptoms out of proportion       PFT  PFT Results Latest Ref Rng & Units 01/06/2022 10/03/2021 01/14/2021 11/06/2020 07/18/2020 01/29/2020 05/10/2019  FVC-Pre L 1.58 1.48 1.49 1.41 1.47 1.39 1.62  FVC-Predicted Pre % 67 63 62 59 62 57 67  FVC-Post L - - - - - - -  FVC-Predicted Post % - - - - - - -  Pre FEV1/FVC % % 90 92 92 93 88 91 89  Post FEV1/FCV % % - - - - - - -  FEV1-Pre L 1.43 1.37 1.37 1.31 1.30 1.26 1.45  FEV1-Predicted Pre % 81 78 77 73 73 69 80  FEV1-Post L - - - - - - -  DLCO uncorrected ml/min/mmHg 8.62 8.92 11.26 9.70 11.19 9.36  10.32  DLCO UNC% % 53 55 69 59 68 57 63  DLCO corrected ml/min/mmHg 8.62 8.92 11.26 10.53 11.19 9.36 -  DLCO COR %Predicted % 53 55 69 64 68 57 -  DLVA Predicted % 75 78 92 89 90 81 93  TLC L - - - - - - -  TLC % Predicted % - - - - - - -  RV % Predicted % - - - - - - -       has a past medical history of Allergy, Arthritis,  Asthma, Cataract, Coronary artery calcification seen on CAT scan (08/19/2017), DDD (degenerative disc disease), thoracic, Depression, DOE (dyspnea on exertion), Fatty liver, GERD (gastroesophageal reflux disease), H/O steroid therapy, Heart palpitations (16/08/9603), Helicobacter pylori ab+, Hemorrhoids, Hiatal hernia, High cholesterol, History of chronic bronchitis, History of migraine, History of MRSA infection, Hyperlipidemia, mixed (08/19/2017), Hyperplastic colon polyp (2007), IBS (irritable bowel syndrome), Inguinal hernia, Insulin resistance, Interstitial lung disease ([place]), MVP (mitral valve prolapse), Pneumonia, Pneumonitis, hypersensitivity (HCC), PONV (postoperative nausea and vomiting), Pre-diabetes, Pulmonary fibrosis (HCC), PVC (premature ventricular contraction) (08/19/2017), Rapid heart rate, Squamous cell carcinoma of skin, Tinnitus, right ear, and Vocal cord ulcer.   reports that she has never smoked. She has never used smokeless tobacco.  Past Surgical History:  Procedure Laterality Date   68 HOUR [place] STUDY N/A 02/21/2018   Procedure: 24 HOUR PH STUDY;  Surgeon: Mauri Pole, MD;  Location: [facility];  Service: Endoscopy;  Laterality: N/A;   BREAST BIOPSY Right 2009   BIOPSY, pt denies   BREAST BIOPSY Left 2003   Benign    CATARACT EXTRACTION Left    CESAREAN SECTION     COLONOSCOPY     ESOPHAGEAL MANOMETRY N/A 02/21/2018   Procedure: ESOPHAGEAL MANOMETRY (EM);  Surgeon: Mauri Pole, MD;  Location: [facility];  Service: Endoscopy;  Laterality: N/A;   FOOT FRACTURE SURGERY  2006 or 2007   right   HYMENECTOMY     LUNG BIOPSY  09/28/2012   Procedure: LUNG BIOPSY;  Surgeon: [place] Nakayama, MD;  Location: [facility];  Service: Thoracic;  Laterality: N/A;  lung biopsies tims three   SQUAMOUS CELL CARCINOMA EXCISION Left    left arm   TOTAL HIP ARTHROPLASTY Right 09/10/2015   Procedure: RIGHT TOTAL HIP ARTHROPLASTY ANTERIOR APPROACH;  Surgeon: Paralee Cancel, MD;   Location: [facility];  Service: Orthopedics;  Laterality: Right;   TOTAL HIP ARTHROPLASTY Left 10/03/2019   Procedure: TOTAL HIP ARTHROPLASTY ANTERIOR APPROACH;  Surgeon: Paralee Cancel, MD;  Location: [facility];  Service: Orthopedics;  Laterality: Left;  70 mins   TUBAL LIGATION     UPPER GI ENDOSCOPY     VIDEO ASSISTED THORACOSCOPY  09/28/2012   Procedure: VIDEO ASSISTED THORACOSCOPY;  Surgeon: [place] Nakayama, MD;  Location: [facility];  Service: Thoracic;  Laterality: Right;   VIDEO BRONCHOSCOPY  11/19/2011   Procedure: VIDEO BRONCHOSCOPY WITH FLUORO;  Surgeon: Brand Males, MD;  Location: [facility];  Service: Endoscopy;;    Allergies  Allergen Reactions   Atorvastatin Other (See Comments)    Leg pain   Betadine [Povidone Iodine] Other (See Comments)    blisters   Codeine Nausea And Vomiting   Garlic Diarrhea   Hydrocodone Nausea And Vomiting   Macrobid [Nitrofurantoin] Other (See Comments)   Ofev [Nintedanib] Other (See Comments)    Abdominal pain   Onion Diarrhea   Other Other (See Comments)   Rosuvastatin Other (See Comments)    Leg pain  Shellfish Allergy Nausea And Vomiting   Sulfa Antibiotics    Sulfonamide Derivatives Other (See Comments)    headaches    Immunization History  Administered Date(s) Administered   Fluad Quad(high Dose 65+) 07/17/2019   Hepatitis A 05/23/2010   Hepatitis B 06/23/2006   Influenza Split 08/11/2012, 08/14/2017   Influenza Whole 09/08/2011   Influenza, High Dose Seasonal PF 08/24/2016, 07/24/2017, 08/31/2018, 08/15/2020, 09/26/2020, 09/12/2021   Influenza,inj,Quad PF,6+ Mos 09/07/2013   Influenza-Unspecified 09/26/2014, 07/27/2015   MMR 05/23/2010   Moderna Sars-Covid-2 Vaccination 07/30/2021, 01/01/2022   PFIZER(Purple Top)SARS-COV-2 Vaccination 12/16/2019, 01/08/2020, 07/18/2020   Pneumococcal Conjugate-13 03/15/2014   Pneumococcal Polysaccharide-23 01/11/2018, 09/26/2020, 08/26/2021   Td 02/22/2003   Tdap 10/28/2012   Zoster  Recombinat (Shingrix) 02/15/2017, 05/17/2017   Zoster, Live 12/04/2010    Family History  Problem Relation Age of Onset   Emphysema Maternal Grandmother    Asthma Maternal Grandmother    Asthma Mother    Osteoarthritis Mother    Dementia Mother 14   Lymphoma Father    Diabetes Father    Hypertension Sister    Heart disease Sister    Kidney disease Sister    Lung disease Maternal Grandfather    Bone cancer Paternal Grandfather    Allergic rhinitis Daughter    Breast cancer Paternal Aunt    Ovarian cancer Maternal Aunt    Breast cancer Cousin    Colon cancer Neg Hx    Angioedema Neg Hx    Eczema Neg Hx    Immunodeficiency Neg Hx    Urticaria Neg Hx      Current Outpatient Medications:    acetaminophen (TYLENOL) 500 MG tablet, Tylenol Extra Strength, Disp: , Rfl:    albuterol (PROAIR HFA) 108 (90 Base) MCG/ACT inhaler, Inhale 2 puffs into the lungs every 4 (four) hours as needed for wheezing. Or coughing spells.  You may use 2 Puffs 5-10 minutes before exercise., Disp: 1 Inhaler, Rfl: 3   Alpha-D-Galactosidase (BEANO PO), Take 1 tablet by mouth as needed (if eating onion or garlic). , Disp: , Rfl:    Ascorbic Acid (VITAMIN C) 1000 MG tablet, Take 1,000 mg by mouth daily., Disp: , Rfl:    CALCIUM CITRATE PO, Take 2 tablets by mouth daily. Or viactive, Disp: , Rfl:    ezetimibe (ZETIA) 10 MG tablet, TAKE 1 TABLET (10 MG TOTAL) BY MOUTH DAILY. PLEASE KEEP UPCOMING APPT FOR FUTURE REFILLS. THANK YOU., Disp: 90 tablet, Rfl: 3   fluticasone (FLONASE) 50 MCG/ACT nasal spray, SPRAY 2 SPRAYS INTO EACH NOSTRIL EVERY DAY, Disp: 48 mL, Rfl: 1   fluticasone furoate-vilanterol (BREO ELLIPTA) 100-25 MCG/ACT AEPB, Inhale 1 puff into the lungs daily., Disp: 180 each, Rfl: 3   lactase (LACTAID) 3000 units tablet, Take by mouth., Disp: , Rfl:    metoprolol succinate (TOPROL-XL) 50 MG 24 hr tablet, Take 1.5 tablets (75 mg total) by mouth daily., Disp: 135 tablet, Rfl: 3   montelukast (SINGULAIR) 10  MG tablet, TAKE 1 TABLET BY MOUTH EVERYDAY AT BEDTIME, Disp: 90 tablet, Rfl: 2   Multiple Vitamin (MULTIVITAMIN) tablet, Take 1 tablet by mouth daily., Disp: , Rfl:    nystatin-triamcinolone (MYCOLOG II) cream, nystatin-triamcinolone 100,000 unit/g-0.1 % topical cream, Disp: , Rfl:    omeprazole (PRILOSEC OTC) 20 MG tablet, Take 20 mg by mouth daily., Disp: , Rfl:    OXYGEN, Inhale into the lungs. 2 to 3 liters as needed during the day and nite time use on/off, Disp: , Rfl:  OZEMPIC, 0.25 OR 0.5 MG/DOSE, 2 MG/1.5ML SOPN, Inject into the skin., Disp: , Rfl:    pravastatin (PRAVACHOL) 40 MG tablet, Take 1 tablet (40 mg total) by mouth every evening., Disp: 90 tablet, Rfl: 1   predniSONE (DELTASONE) 5 MG tablet, 1 tablet, Disp: , Rfl:    Probiotic Product (PROBIOTIC ADVANCED PO), Take 1 capsule by mouth daily., Disp: , Rfl:    Sodium Chloride, Inhalant, 7 % NEBU, Use per nebulizer once a day to help clear sputum, Disp: 120 mL, Rfl: 3   TURMERIC PO, Take 1,000 mg by mouth every other day., Disp: , Rfl:    azelastine (ASTELIN) 0.1 % nasal spray, Place 1 spray into both nostrils at bedtime. (Patient not taking: Reported on 01/06/2022), Disp: , Rfl:    chlorpheniramine (CHLOR-TRIMETON) 4 MG tablet, Take 2 mg by mouth 2 (two) times daily. Takes half tablet twice daily, Disp: , Rfl:    dicyclomine (BENTYL) 10 MG capsule, Take 1 capsule by mouth daily as needed. (Patient not taking: Reported on 01/06/2022), Disp: , Rfl:    EPINEPHrine 0.3 mg/0.3 mL IJ SOAJ injection, Inject into the muscle. (Patient not taking: Reported on 01/06/2022), Disp: , Rfl:    levocetirizine (XYZAL) 5 MG tablet, Take 5 mg by mouth at bedtime. (Patient not taking: Reported on 01/06/2022), Disp: , Rfl:    nitroGLYCERIN (NITROSTAT) 0.4 MG SL tablet, Place 1 tablet (0.4 mg total) under the tongue every 5 (five) minutes as needed for chest pain., Disp: 25 tablet, Rfl: 12      Objective:   Vitals:   01/06/22 1047  BP: 120/60  Pulse:  83  Temp: 97.8 F (36.6 C)  TempSrc: Oral  SpO2: 96%  Weight: 140 lb 3.2 oz (63.6 kg)  Height: _0  (1.499 m)    Estimated body mass index is 28.32 kg/m as calculated from the following:   Height as of this encounter: _1  (1.499 m).   Weight as of this encounter: 140 lb 3.2 oz (63.6 kg).  _2 @  Filed Weights   01/06/22 1047  Weight: 140 lb 3.2 oz (63.6 kg)     Physical Exam    General: No distress. Loiks well Neuro: Alert and Oriented x 3. GCS 15. Speech normal Psych: Pleasant Resp:  Barrel Chest - no.  Wheeze - no, Crackles -scattered upper lobe crackle, No overt respiratory distress CVS: Normal heart sounds. Murmurs - no Ext: Stigmata of Connective Tissue Disease - no HEENT: Normal upper airway. PEERL +. No post nasal drip        Assessment:       ICD-10-CM   1. Hypersensitivity pneumonitis ([place])  J67.9     2. ILD (interstitial lung disease) ([place])  J84.9     3. Gastroesophageal reflux disease, unspecified whether esophagitis present  K21.9     4. Hiatal hernia  K44.9     5. Travel advice encounter  Z71.84          Plan:     Patient Instructions     ICD-10-CM   1. Hypersensitivity pneumonitis ([place])  J67.9     2. ILD (interstitial lung disease) ([place])  J84.9     3. Gastroesophageal reflux disease, unspecified whether esophagitis present  K21.9     4. Hiatal hernia  K44.9     5. Travel advice encounter  Z71.84         Pulmonary Fibrosis is every so steadily worse over years but Feb 2023 PFT improved and similar to  1 year ago. Having sesasonal and gerd related fluctutations.   Currently on prednisone 2m per day - noted musculoskeletal issue and implications that long term prednisone is a risk facttor for this  [facility]  Plan  -gerd managmene by GI as before  - respect no rechallenge with esbriet or ofev (prior visit(  - Respect lack of interest in Cell[facility] given side effect profile (prior  visit) -Respect declining lung transplant evaluation [not consistent with the goals of care; prior visit] - continue weight loss - goal BMI 27   -go for low carb diet - continue o2 for pulse ox goal > 88% - stay on prednisome 581mper day - take o2 for for air travel on [employer] - ILD-PRO registry visit with MALeda Gauzeuddeback [facility] 01/06/2022    Followup -6 months to spirometry and DLCO and return to see Dr. RaChase Calleror a face-to-face visit 30-minute slot     SIGNATURE    Dr. MuBrand MalesM.D., F.C.C.P,  Pulmonary and Critical Care Medicine Staff Physician, Co[place] Heightsirector - Interstitial Lung Disease  Program  Pulmonary Fi[place]ishramt [id number]: [phone] no answer or between  15:00h - 7:00h: call [phone] Telephone: [phone]  3:59 PM 01/06/2022

## 2022-01-22 ENCOUNTER — Telehealth: Payer: Self-pay | Admitting: Cardiology

## 2022-01-22 MED ORDER — METOPROLOL SUCCINATE ER 50 MG PO TB24: 75.0000 mg | ORAL_TABLET | Freq: Every day | ORAL | 3 refills | Status: DC

## 2022-01-22 NOTE — Telephone Encounter (Signed)
?*  STAT* If patient is at the pharmacy, call can be transferred to refill team. ? ? ?1. Which medications need to be refilled? (please list name of each medication and dose if known)  ?metoprolol succinate (TOPROL-XL) 50 MG 24 hr tablet patient takes 1.5 tablets daily  ? ?2. Which pharmacy/location (including street and city if local pharmacy) is medication to be sent to? ?[place], [place]. Do they need a 30 day or 90 day supply? 90 with refills ? ?[facility] of 0.5 mg and they need an updated rx.  ?The patient is out of medication  ?

## 2022-01-22 NOTE — Addendum Note (Signed)
Addended by: Wonda Horner on: 01/22/2022 01:12 PM ? ? Modules accepted: Orders ? ?

## 2022-02-13 ENCOUNTER — Encounter: Payer: Self-pay | Admitting: Cardiology

## 2022-02-13 MED ORDER — EZETIMIBE 10 MG PO TABS: 10.0000 mg | ORAL_TABLET | Freq: Every day | ORAL | 3 refills | Status: DC

## 2022-02-21 ENCOUNTER — Encounter: Payer: Self-pay | Admitting: Internal Medicine

## 2022-02-23 MED ORDER — FLUTICASONE PROPIONATE 50 MCG/ACT NA SUSP: NASAL | 1 refills | Status: DC

## 2022-03-26 ENCOUNTER — Other Ambulatory Visit: Payer: Self-pay | Admitting: Family Medicine

## 2022-03-26 ENCOUNTER — Other Ambulatory Visit: Payer: Self-pay | Admitting: Internal Medicine

## 2022-03-26 DIAGNOSIS — Z1231 Encounter for screening mammogram for malignant neoplasm of breast: Secondary | ICD-10-CM

## 2022-04-07 ENCOUNTER — Ambulatory Visit
Admission: RE | Admit: 2022-04-07 | Discharge: 2022-04-07 | Disposition: A | Discharge status: Home / Self Care | Payer: Medicare PPO | Source: Ambulatory Visit | Attending: Family Medicine | Admitting: Family Medicine

## 2022-04-07 DIAGNOSIS — Z1231 Encounter for screening mammogram for malignant neoplasm of breast: Secondary | ICD-10-CM

## 2022-04-08 ENCOUNTER — Encounter: Payer: Self-pay | Admitting: Internal Medicine

## 2022-04-08 DIAGNOSIS — J849 Interstitial pulmonary disease, unspecified: Secondary | ICD-10-CM

## 2022-04-09 NOTE — Telephone Encounter (Signed)
Yes is fine to return

## 2022-04-29 ENCOUNTER — Encounter: Payer: Medicare PPO | Admitting: *Deleted

## 2022-04-29 DIAGNOSIS — Z006 Encounter for examination for normal comparison and control in clinical research program: Secondary | ICD-10-CM

## 2022-04-29 DIAGNOSIS — J849 Interstitial pulmonary disease, unspecified: Secondary | ICD-10-CM

## 2022-04-30 NOTE — Research (Signed)
Title: Chronic Fibrosing Interstitial Lung Disease with Progressive Phenotype Prospective Outcomes (ILD-PRO) Registry    Protocol #: IPF-PRO-SUB, Clinical Trials # [id number], Sponsor: [employer]   Protocol Version Amendment 4 dated 12Sep2019  and confirmed current on  Consent Version for today's visit date of  Is [facility] IRB Approved Version 27 Oct 2018 Revised 27 Oct 2018   Objectives:  Describe current approaches to diagnosis and treatment of chronic fibrosing ILDs with progressive phenotype  Describe the natural history of chronic fibrosing ILDs with progressive phenotype  Assess quality of life from self-administered participant reported questionnaires for each disease group  Describe participant interactions with the healthcare system, describe treatment practices across multiple institutions for each disease group  Collect biological samples linked to well characterized chronic fibrosing ILDs with progressive phenotype to identify disease biomarkers  Collect data and biological samples that will support future research studies.                                            Key Inclusion Criteria: Willing and able to provide informed consent  Age ? 30 years  Diagnosis of a non-IPF ILD of any duration, including, but not limited to Idiopathic Non-Specific Interstitial Pneumonia (INSIP), Unclassifiable Idiopathic Interstitial Pneumonias (IIPs), Interstitial Pneumonia with Autoimmune Features (IPAF), Autoimmune ILDs such as Rheumatoid Arthritis (RA-ILD) and Systemic Sclerosis (SSC-ILD), Chronic Hypersensitivity Pneumonitis (HP), Sarcoidosis or Exposure-related ILDs such as asbestosis.  Chronic fibrosing ILD defined by reticular abnormality with traction bronchiectasis with or without honeycombing confirmed by chest HRCT scan and/or lung biopsy.  Progressive phenotype as defined by fulfilling at least one of the criteria below of fibrotic changes (progression set point)  within the last 24 months regardless of treatment considered appropriate in individual ILDs:  decline in FVC % predicted (% pred) based on >10% relative decline  decline in FVC % pred based on ? 5 - <10% relative decline in FVC combined with worsening of respiratory symptoms as assessed by the site investigator  decline in FVC % pred based on ? 5 - <10% relative decline in FVC combined with increasing extent of fibrotic changes on chest imaging (HRCT scan) as assessed by the site investigator  decline in DLCO % pred based on ? 10% relative decline  worsening of respiratory symptoms as well as increasing extent of fibrotic changes on chest imaging (HRCT scan) as assessed by the site investigator independent of FVC change.     Key Exclusion Criteria: Malignancy, treated or untreated, other than skin or early stage prostate cancer, within the past 5 years  Currently listed for lung transplantation at the time of enrollment  Currently enrolled in a clinical trial at the time of enrollment in this registry       Clinical Research Coordinator / Research RN note : This visit for [facility] A. Witzke Subject [phone] with DOB: 09-02-1951 on 04/29/2022 for the above protocol is Visit/Encounter 8 and is for purpose of research.    Subject expressed continued interest and consent in continuing as a study subject. Subject confirmed that there was no change in contact information (e.g. address, telephone, email). Subject thanked for participation in research and contribution to science.      During this visit on 04/29/2022, the subject completed the blood work and questionnaires per the above referenced protocol. Please refer to the subject's paper source binder for further details.  Signed by Reino Bellis Research Assistant [facility]  [place], [place]  08:29 AM 04/30/2022

## 2022-05-08 ENCOUNTER — Ambulatory Visit
Admission: RE | Admit: 2022-05-08 | Discharge: 2022-05-08 | Disposition: A | Discharge status: Home / Self Care | Payer: Medicare PPO | Source: Ambulatory Visit | Attending: Family Medicine | Admitting: Family Medicine

## 2022-05-08 DIAGNOSIS — E2839 Other primary ovarian failure: Secondary | ICD-10-CM

## 2022-05-13 ENCOUNTER — Telehealth: Payer: Self-pay | Admitting: Internal Medicine

## 2022-05-13 MED ORDER — PREDNISONE 5 MG PO TABS: 5.0000 mg | ORAL_TABLET | Freq: Every day | ORAL | 3 refills | Status: DC

## 2022-05-13 NOTE — Telephone Encounter (Signed)
Rx for pt's prednisone has been sent to pharmacy for pt. Called and spoke with pt letting her know this had been done and she verbalized understanding. Nothing further needed. 

## 2022-05-13 NOTE — Telephone Encounter (Signed)
Patient is requesting a 90 day supply of prednisone. Uses [facility].  Please advise, call back number is [phone].

## 2022-07-20 ENCOUNTER — Other Ambulatory Visit: Payer: Self-pay

## 2022-07-20 DIAGNOSIS — J849 Interstitial pulmonary disease, unspecified: Secondary | ICD-10-CM

## 2022-07-21 ENCOUNTER — Ambulatory Visit (INDEPENDENT_AMBULATORY_CARE_PROVIDER_SITE_OTHER): Payer: Medicare PPO | Admitting: Internal Medicine

## 2022-07-21 ENCOUNTER — Encounter: Payer: Self-pay | Admitting: Internal Medicine

## 2022-07-21 VITALS — BP 112/64 | HR 75 | Temp 98.6°F | Ht 59.0 in | Wt 137.0 lb

## 2022-07-21 DIAGNOSIS — K219 Gastro-esophageal reflux disease without esophagitis: Secondary | ICD-10-CM

## 2022-07-21 DIAGNOSIS — J849 Interstitial pulmonary disease, unspecified: Secondary | ICD-10-CM

## 2022-07-21 DIAGNOSIS — K449 Diaphragmatic hernia without obstruction or gangrene: Secondary | ICD-10-CM | POA: Diagnosis not present

## 2022-07-21 DIAGNOSIS — J679 Hypersensitivity pneumonitis due to unspecified organic dust: Secondary | ICD-10-CM | POA: Diagnosis not present

## 2022-07-21 LAB — PULMONARY FUNCTION TEST
DL/VA % pred: 64 %
DL/VA: 2.74 ml/min/mmHg/L
DLCO cor % pred: 48 %
DLCO cor: 7.86 ml/min/mmHg
DLCO unc % pred: 50 %
DLCO unc: 8.16 ml/min/mmHg
FEF 25-75 Pre: 2.34 L/sec
FEF2575-%Pred-Pre: 157 %
FEV1-%Pred-Pre: 81 %
FEV1-Pre: 1.4 L
FEV1FVC-%Pred-Pre: 120 %
FEV6-%Pred-Pre: 70 %
FEV6-Pre: 1.54 L
FEV6FVC-%Pred-Pre: 105 %
FVC-%Pred-Pre: 67 %
FVC-Pre: 1.54 L
Pre FEV1/FVC ratio: 91 %
Pre FEV6/FVC Ratio: 100 %

## 2022-07-21 NOTE — Progress Notes (Signed)
Spirometry and Dlco done today. 

## 2022-07-21 NOTE — Progress Notes (Signed)
#GE reflux with small hiatal hernia  - on ppi   #History of rapid heart rate not otherwise specified  - Start her on Lopressor  2008/2009 by primary care physician. History appears to correlate with onset of respiratory issues  - refuses to dc this drug as of 2014 discussion   # Hypersensitivity Pneumonitis and ILD  - Potential etiologies - cockateil x 2 for 18 years through end 2012. 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 Denies mold but lives in house built in [place] with a "weird baselment: and has humidifier  - first noted on CT chest 10/15/09 following trip to [place] (PE negative) but not described in 2003 CT chest report  - autoimmune panel: 10/23/11: Negative  -  Uderwennt bronch 11/19/11  - non diagnostic  - VATS Nov 2013 (done after initially refusing)- [place]. However, there is worrying trend of UIP pattern in the Upper lobes.         OV 06/17/2017  Chief Complaint  Rhonda presents with   [name]    Rhonda [name]. Rhonda denies significant cough, denies CP/tightness, f/c/s.     She is better. Feels she does not need o2. Has been to [facility] for transplant clinic; felt too early. Seems higher dose prednisone helping but then she is also bettter t his time of the year. In May 2018 I was at [facility] and curbsided national thought leaders - feel that we could explore silent active GERD as a possibility of ILD getting wors with time.   OV 01/11/2018  Chief Complaint  Rhonda presents with   [name]    PFT done today.  Rhonda [name]. From coughing, Rhonda has had pain in ribs. Rhonda states she has mild SOB which is stable due to rehab.  Rhonda states that Dr. Darron Doom is wanting to know if Rhonda could possibly have eosinophilic esophagitis.   Went to [name]. Returned and has been coughing x  2 weeks. Associated with this is some rib pain on right side. She feels she does not need o2. She feels she does not need pred/abx at this time. She also exposed to flu wit family   OV 02/08/2018  Chief Complaint  Rhonda presents with   Acute Visit    CXR done 02/07/18.  Rhonda states her throat is sore but not as bad as it was yesterday when in office and does have some left ear pain. Rhonda states she has some pain on right rib cage and has increased fatigue.     Rhonda [name] presents acutely.  I last saw her one month ago gave her Tamiflu for prophylaxis after flu exposure.  At that point in time she was already reducing her chronic stable prednisone of 10 mg for interstitial lung disease/hypersensitivity pneumonitis.  She had tapered herself slowly 3 or 4 weeks ago to 5 mg.  She says she was doing fairly okay but in the last 2 or 3 weeks she has had increased fatigue.  She says she goes daily swimming and then feels energized but then an hour later starts having fatigue which is more than usual.  Also in the daytime she has random fatigue.  In terms of her respiratory symptoms these are stable and in fact slightly better in terms of shortness of breath and cough but  she is having sore throat without any fever and some left-sided otalgia.  In addition she is having right-sided chest pains that are more than usual.  The chest pains have been reported before.  These are specifically at localized spots along the previous incision several years ago for interstitial lung disease.  They are tender as [name] to touch.  This pain is worse.  In addition she is also being bothered by arthralgia in her hand and left hip.  The left hip arthralgias old with a hand arthralgias new.  She is worried about pneumothorax recurrence and so she had a chest x-ray yesterday that shows no pneumothorax.  She has stable interstitial lung disease changes but in my personal visualization these interstitial lung disease changes are worse  compared to several years ago.  We do know that she has slowly worsening interstitial lung disease.  In terms of a walking desaturation test this shows stability since last visit.  She has seen GI for acid reflux and has pH probe study coming up  In talking to her she tells me she has been on metformin for over a month or 2.  This is new and is meant for pre-diabetes.  Apparently her hemoglobin A1c is always below 6 but is above 5.5.  Walking desaturation test on 02/08/2018 185 feet x 3 laps on ROOM AIR:  did walk normal pace with forehead probe desaturate. Rest pulse ox was 99%, final pulse ox was 93%. HR response 72/min at rest to 93/min at peak exertion. Rhonda Rhonda [name]  Did not Desaturate < 88% . Rhonda [name] yes did  Desaturated </= 3% points. Rhonda [name] yes did get tachyardic   Dg Chest 2 View  Result Date: 02/07/2018 CLINICAL DATA:  Chest wall pain for 2 weeks EXAM: CHEST - 2 VIEW COMPARISON:  CT 08/20/2017, radiograph 10/11/2015 FINDINGS: Coarse interstitial pattern consistent with pulmonary fibrosis, similar distribution compared to prior. No focal pulmonary opacity or pleural effusion. Stable cardiomediastinal silhouette with aortic atherosclerosis. No pneumothorax. Degenerative changes of the spine. IMPRESSION: No active cardiopulmonary disease. Similar appearance of diffuse pulmonary fibrosis. Negative for a pneumothorax. Electronically Signed   By: Donavan Foil M.D.   On: 02/07/2018 14:26    OV 04/13/2018  Chief Complaint  Rhonda presents with   [name]    Rhonda had pre spiro and DLCO PFT prior to OV. Rhonda has increase of productive cough-thick white in last week. Rhonda has some wheezing, and allergy issues.   Rhonda [name] presents for routine [name].  I saw her acutely just approximately 2 months ago.  Then end of April 2019 she saw a nurse practitioner again acutely and was given antibiotic and prednisone.  She says she felt better after that but in the last week  or so due to increased pollen load in the community and also moving furniture because her daughter is relocating out of her home she started having more cough.  There is no change in her dyspnea with the cough is really bad.  This is despite Singulair and Chlor-Trimeton.  Those things to help but just take the edge off.  She feels that the pollen making the cough worse.  There is no change in dyspnea.  Walking desaturation test documented below his baseline.  She had pulmonary function test today but the [facility] shows significant decline compared to February 2019 and she feels surprised by this.  DLCO is baseline unchanged and a walking desaturation test limited in the office  is also unchanged.  She does not have any fever but has significant postnasal drip.  She has not followed up at [facility] transplant clinic or the allergist recently.  OV 08/16/2018  Subjective:  Rhonda ID: Rhonda [name], female , DOB: 01-23-1951 , age 63 y.o. , MRN: [id number] , AD[address]ESS: [address] [address]   08/16/2018 -   Chief Complaint  Rhonda presents with   [name]    PFT performed today.  Rhonda states she has had a lot of mucus the month of September 2019 so she has been taking the Chlor-Trimeton. States other than that she has been able to do a lot more activities and has been swimming a lot more. Rhonda states she believes the SOB is stable.     Rhonda [name] 71 y.o. -continues to do [name].  She is taking higher dose of prednisone 10 mg/day.  Also the summer usually she is doing [name].  She is not having much of a cough.  Lung function test shows improvement compared to tests done earlier this year.  In fact she is almost as good as much 2018.  Still compared to 2014-2016 she has progressive lung disease.  Walking desaturation test is stable.  She needs a high-dose flu shot but we do not have stock today.  She has lost some weight intentionally with better dietary control.  She is exercising  regularly swimming.  We discussed ofev  potential future therapy if the drug is approved for this indication.  We will know study results in a year or so.  She is open to the idea but is worried somewhat about her irritable bowel syndrome flaring up.  She is no longer following at the [facility] lung transplant clinic given stability lung function  Other issues: She always has right-sided chest wall pain to the site of lung biopsy.  The only way this is been resolved as by her limiting her use of bra .  Also her irritable bowel syndrome is under control but in case it flares up she wants a refill of dicyclomine     OV 12/15/2018  Subjective:  Rhonda ID: Rhonda [name], female , DOB: September 20, 1951 , age 27 y.o. , MRN: [id number] , AD[address]ESS: [address] [address]   12/15/2018 -   Chief Complaint  Rhonda presents with   [name]    Rhonda states she has been doing better since last visit with TP but states she has been having pain in her rib cage x2 years but states it has become more intense.     Rhonda Rhonda [name] 71 y.o. -returns for [name] of her ILD due to hypersensitivity pneumonitis.  She did a clinic visit also for research with the ILD-pro registry study.  Since I last saw her in September 2019 she had a flareup and required higher doses of prednisone.  Since then she is returned to baseline of 5 mg prednisone and she feels good.  She is stable.  Walking desaturation test shows stability.  We have been discussing over the last few months about taking nintedanib based on new data and progressive non--IPF ILD's.  She has trepidation for this because of irritable bowel syndrome.  After much discussion she is willing to try 100 mg once daily for a month and then escalate to 100 mg twice daily which is the therapeutic dose.  She will do this once after insurance approval which we suspect will happen over  the next few months.  Her main issue is that her right sided  infra-axillary chest pain is getting worse.  This started after her surgical lung biopsy and pneumothorax on the right side.  It is random and happens with twisting.  But now days it happens once every few days.  Previously it used to happen once every few weeks.  Sometimes it is excruciating but then she is left in pain for a few days.  Applying Abrol sudden twisting of her body makes it worse.  Is a lancinating pain.  She did try gabapentin some years ago for chronic cough .  She did tolerate the gabapentin [name].  She does not remember if it helped the pain or not but clearly back and the pain was much less intense.  She is willing to try this again.  I recommended a CT scan of the chest but she is going to have radiation for a CT coronary angiogram because of high levels of coronary artery calcification.  Therefore written to the radiologist to see if he can look at the lung parenchyma with a CT angiogram    OV 05/09/2019  Subjective:  Rhonda ID: Rhonda [name], female , DOB: May 22, 1951 , age 55 y.o. , MRN: [id number] , AD[address]ESS: [address] [place] [address]   05/09/2019 -   Chief Complaint  Rhonda presents with   ILD (Interstitial Lung Disease)     Rhonda Rhonda [name] 71 y.o. -[name] for chronic care physician pneumonitis on daily 5 mg prednisone.  She has problems with right-sided chest wall pain that is neuropathic.  She tells me since her last visit January 2020 she has been isolating and social distancing because of the pandemic.  Overall she is been stable.  Her symptom scores are mild and documented below.  She continues with prednisone.  She was supposed to have pulmonary function test but this was held because of the pandemic.  Her walking desaturation test shows she is stable.  She is supposed to have pulmonary function test tomorrow but wanted to see me today.  In the interim she did have a cardiac CT scan of the chest that shows 94th percentile of coronary calcium.  She  could not get a CT angiogram done because of PVC.  A nuclear medicine stress test was done and I reviewed this result and it was normal.  A Holter test is pending.  She is kind of nervous because of the ongoing COVID-19 situation and safety of doing a Holter test.  In terms of her right chest wall pain she is adapted to it.  She does not use any bra  OV 08/22/2019  Subjective:  Rhonda ID: Rhonda [name], female , DOB: 1951/04/07 , age 26 y.o. , MRN: [id number] , AD[address]ESS: [address] [place] [address]   08/22/2019 -   Chief Complaint  Rhonda presents with   [name]    needs sx clearance for L hip replacement- not yet scheduled, pending clearance.     Rhonda lung disease chronic hypersensitive pneumonitis on chronic daily prednisone 5 mg/day  Rhonda Rhonda [name] 71 y.o. -presents for [name] of her interstitial lung disease due to chronic hypersensitive pneumonitis.  After last visit in June 2020 given her PFT stability and also her aversion to the potential side effects with nintedanib she decided to just continue with prednisone daily 5 mg/day.  Given the pandemic she is walking 1 mile a day without stopping.  Her interstitial lung  disease symptom score is actually slightly better.  Overall she reports stability in her interstitial lung disease.  She has a new issue of getting preoperative clearance for her left hip.  She says the left hip is bone-on-bone and she does not want to take Aleve.  She prefers to have surgery.  Dr. Paralee Cancel is the surgeon.  Surgery date has not been set.  She is very functional.  She has normal renal function.  Normal nutritional status.  No anemia.  No respiratory infections in the last 1 month.  She is not on oxygen.  Age 77.  The surgeries in the left hip and the duration of surgery is under 3 hours.  Anesthesia will be general.      OV 01/30/2020  Subjective:  Rhonda ID: Rhonda [name], female , DOB: February 25, 1951 , age 18 y.o. , MRN:  [id number] , AD[address]ESS: [address] [place] [place] [address] Rhonda lung disease chronic hypersensitive pneumonitis on chronic daily prednisone 5 mg/day  01/30/2020 -   Chief Complaint  Rhonda presents with   [name]    PFT performed 3/8.  Rhonda states she believes she has been going downhill for the past 2 months. Rhonda was started on O2 at last OV and wears it prn. Rhonda does have an occ cough.     Rhonda Rhonda [name] 71 y.o. -presents for [name] of interstitial lung disease secondary to chronic hypersensitive pneumonitis.  She maintains herself on prednisone 5 mg/day.  In the interim she has had hip surgery and this did wonders for her.  She is able to walk longer distances.  However she does notice that her dyspnea has declined.  She says since January 2021 his symptoms have declined particularly with cough.  Usually in the winter she goes through a cycle of severe cough and exacerbation that requires higher levels of prednisone.  She has been social distancing [name] and masking [name].  She states there is absolutely no mold or water any antigen exposure at home.  But she feels it is her allergies acting up and making her more symptomatic.  She has had a Covid vaccine and is wondering about either going back to swimming or starting pulmonary rehabilitation.  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  In fact when we walked her today with the mask she did drop down to 89%.  This is a decline for her.  She had pulmonary function test and this shows significant decline in FVC and DLCO.  In review of this that.  Where she has declined this much but is always bounce back with steroids.  At last visit we discussed nintedanib for her as a way to prevent progression of her ILD but given her concerns of GI side effects and the presence of irritable bowel syndrome she is declined.  Infective have this conversation with her  including her joining the [employer] at [facility] but she has always been worried about the GI side effects with nintedanib.  This time she is more open to it because of the decline in lung function.  She wants to see an allergist  She also told me that she continues to have a restrictive chest pain particularly on the right side lower area.  She no longer wears a brassiere it has been nearly 3 years since she had a high-resolution CT chest.  She is open to  having one now.  Last liver function test was normal in January 2020.  Last renal function was normal in November 2020.      OV 03/19/2020  Subjective:  Rhonda ID: Rhonda [name], female , DOB: 1951/06/24 , age 49 y.o. , MRN: [id number] , [address]ESS: [address] [place] [address]  Chronic hypersensitive pneumonitis interstitial lung disease on prednisone daily 10 mg 03/19/2020 -   Chief Complaint  Rhonda presents with   [name]    Rhonda states she is doing better since last visit and states her breathing has also improved.     Rhonda Rhonda [name] 71 y.o. -at last visit in March 2021 there was decline in lung function.  Therefore we started nintedanib.  She took 1 tablet of 150 mg and then had immediate abdominal pain and fever.  Therefore she is decided against taking this.  I also support this decision because I do not think she will tolerate nintedanib.  We did extensive interstitial lung disease question a history again at that time.  She had taken 1 dose of nitrofurantoin.  Have indicated to her that she should never take this medicine again.  In addition discovered that she might have some feather jackets at home she has now gotten rid of it.  She is now doing pulmonary rehabilitation.  Her overnight oxygen desaturation test is negative.  At pulmonary rehabilitation she tells me she does not drop below 91% and 1 time she went to 89%.  Otherwise overall doing great.  Her cardiologist is increased her Lopressor.  Her daughter is getting  married and she is busy with the wedding planning this is being emotionally stressful for her.  Her allergist has increased her recommendations and this is also helping her.  Her allergist is retiring and she wanted recommendations for new allergist.  I have recommended Dr. Fredderick Phenix and Dr. Remus Blake.  Incidentally that right-sided chest pain that she had is also better with increased prednisone of 10 mg/day.  She feels prednisone is working really [name] for her.    OV 08/29/2020   Subjective:  Rhonda ID: Rhonda [name], female , DOB: 1951/01/03, age 19 y.o. years. , MRN: [id number],  ADDRESS: [address] [address] PCP  Hayden Rasmussen, MD Providers : Treatment Team:  Attending Provider: Brand Males, MD Rhonda Care Team: Hayden Rasmussen, MD as PCP - General (Family Medicine) Minus Breeding, MD as PCP - Cardiology (Cardiology) Brand Males, MD as Consulting Physician (Pulmonary Disease) Verl Blalock, Marijo Conception, MD (Inactive) (Cardiology) Kennith Center, RD as Dietitian (Family Medicine) Minus Breeding, MD as Consulting Physician (Cardiology)  [name] chronic hypersensitive pneumonitis on chronic prednisone 10 mg/day.  Last high-resolution CT chest March 2021.  Intolerant to nintedanib.  Chief Complaint  Rhonda presents with   [name]    Rhonda states she had been doing [name] since last visit up until 3-4 weeks ago and started having problems with her breathing and has had to use her O2 with activities. Rhonda also has been coughing a lot and is getting up clear phlegm. Rhonda also has had some mild wheeze.       Rhonda Rhonda [name] 71 y.o. -returns for [name].  Since I last saw her her daughter is now married.  She had a great wedding.  She was able to enjoy this and dance quite [name].  She barely used her oxygen her effort tolerance was good.  Some 3 [place].  Apparently she had difficulty getting her oxygen tank  to clinic to the electrical port.  She noticed with exertion a pulse ox dropping to the 70s.  She said even before she left a few days prior to that she started having increasing cough.  She notices that these cough cycles get exacerbated in the fall in the winter.  Looking back at her pulmonary function test there is always a dip between October and March each year.  Between 2015 and 2017 there was a decline and since then there is a waxing and waning quality with dips in the winter in the fall season.  Each time she responds to prednisone.  Most recent pulmonary function test in August 2021 is actually improved compared to March 2021.  However today she is feeling worse.  She feels something is in her airway.  She feels dry air in the house and the animals bringing leaves from outside is contributing.  She says she feels better upstairs than downstairs where the dogs.  She again denies any mold or mildew.  Denies any feather jacket exposure.  She feels she will benefit from another round of prednisone.  She feels in the past antibiotics have not helped her as much as prednisone.   In terms of chronic ILD HP: She not tolerate nintedanib in the past.  I explained to her that I am concerned about progression and her decline in functional quality and the risk for that.  We discussed pirfenidone as an alternative.  Discussed the fact is less [name] studied although have no reason to see why it would not be effective.  Explained the side effect profile.  She is willing to try this.  We decided to go donor samples from the Rhonda support group  Oxygen qualification: We walked her and she qualified for oxygen today.  Is for portable oxygen with [employer]  Other issue: Spring allergies: -Her allergist is retired.  We discussed options of referral.  Referred her to Dr. Hardie Pulley referred to     OV 01/14/2021  Subjective:  Rhonda ID: Rhonda [name], female , DOB: 04/11/1951 , age 64 y.o. , MRN: [id number] ,  ADDRESS: [address] [place] [address] PCP Hayden Rasmussen, MD Rhonda Care Team: Hayden Rasmussen, MD as PCP - General (Family Medicine) Minus Breeding, MD as PCP - Cardiology (Cardiology) Brand Males, MD as Consulting Physician (Pulmonary Disease) Verl Blalock, Marijo Conception, MD (Inactive) (Cardiology) Kennith Center, RD as Dietitian (Family Medicine) Minus Breeding, MD as Consulting Physician (Cardiology)  This Provider for this visit: Treatment Team:  Attending Provider: Brand Males, MD    01/14/2021 -   Chief Complaint  Rhonda presents with   [name]    Nauseous, loss of appetite    [name] chronic hypersensitive pneumonitis on prednisone 5 mg/day and also pirfenidone submaximal dose of 2 pills 3 times daily since October 2021.  On ILD-pro registry protocol  - failed ofev   - failed esbrit due to side effects Faythe Ghee 2022  She is also on Breo  Rhonda Rhonda [name] 71 y.o. -returns for [name].  She is now on pirfenidone along with low-dose prednisone.  She feels the pirfenidone is not reacting [name] with her at all.  She has intermittent loss of taste at least twice a week.  She has loss of appetite and she has forces herself to eat.  She also has fatigue.  She says that if I strongly  recommended then she will continue to soldier on with it.  Otherwise she feels less short of breath.  Pulmonary function test reviewed and is actually stable/slightly improved.  She says the cough is extremely [name] controlled except today after the pulmonary function test she was coughing quite a bit.  She is looking forward to going back to swimming.  She has had a Covid vaccine in booster and also status post EVUSHELD monoclonal antibody prophylaxis.  We discussed the possibility about possibility of enrolling in inhaled nitric oxide device/administration study.  This is to see if people can improve in the shortness of breath scale and also physical activity.  She took the consent form and  is deliberating.  She thinks she might find the study and inconvenience to her lifestyle where she is wanting to be more active.  We went over several details of this research protocol  She has a new symptom of like itching sensation deep in the right flank.  She feels it is not the skin it is from deep inside.  She is wondering if it could be related to pirfenidone.  Started after going on pirfenidone.  Explained to her that I am not seeing this in other patients with pirfenidone but will have to be open minded.  She does not apply Vaseline to her skin in the winter.  We discussed the possibility this could be dry skin related but she does not have other features of dry skin.  Nevertheless she is willing to try Vaseline.   Results for CHAPP   OV 07/08/2021  Subjective:  Rhonda ID: Rhonda [name], female , DOB: 21-Nov-1951 , age 64 y.o. , MRN: [id number] , [address]: [address] [address] [place] [address] PCP Hayden Rasmussen, MD Rhonda Care Team: Hayden Rasmussen, MD as PCP - General (Family Medicine) Minus Breeding, MD as PCP - Cardiology (Cardiology) Brand Males, MD as Consulting Physician (Pulmonary Disease) Verl Blalock, Marijo Conception, MD (Inactive) (Cardiology) Kennith Center, RD as Dietitian (Family Medicine) Minus Breeding, MD as Consulting Physician (Cardiology)  This Provider for this visit: Treatment Team:  Attending Provider: Brand Males, MD    07/08/2021 -  ACUTE VISIT Chief Complaint  Rhonda presents with   Acute Visit    Rhonda states that she did start taking zpak yesterday 8/15 and said her cough is doing some better after being on second day of abx and said that she started prednisone today 8/16. States that she still has increased SOB. Rhonda did take a covid test which came back negative.   [name] chronic hypersensitive pneumonitis on prednisone 5 mg/day and also pirfenidone submaximal dose of 2 pills 3 times daily since October 2021.  On ILD-pro registry protocol -  failed ofev   - failed esbrit due to side effects marh 2022  She is also on Breo   Rhonda Rhonda [name] 71 y.o. -this is an acute visit.  Last seen in February 2022 after that she was supposed to see me back in 3 months.  But this visit has been scheduled acutely.  She tells me that early in May 2022 she went to [facility].  They rented a house.  She believes that the bedroom had some mold.  She did not visually confirmed the mold but husband might have.  She says shortly after going there and spending a week she started having worsening cough with hoarseness of voice.  This cough is persisted all along and the hoarseness has persisted as [name].  The in June 2022 she had a short course of steroids that did not help.  She believes the dosing was not strong enough.  In July 2022 she had some back issues and was given 30 mg of prednisone for approximately 10 days with a taper and this helped her back.  But all along the cough is persisted and severe hoarseness is persisted.  Then approximately a week ago she started getting shortness of breath along with yellow sputum.  The cough got worse .  She states even at baseline she brings her a lot of white sputum and has to cough a lot.  [place] and a couple of days into the illness.  At this point in time she started noticing she was more easily desaturating.  She is not able to swim.  She says walking to the car her pulse ox dropped to 84%.  Then the yellow phlegm started getting worse.  She called in yesterday and we prescribed Z-Pak and a prednisone burst.  She says now with the Z-Pak the sputum color is improving.  A week ago she also saw Dr. Blenda Nicely for chronic cough.  100 mg 3 times daily of gabapentin has been prescribed.  She is slowly increasing it.  She is worried about the side effects of this drug.  Apparently her vocal cord had ulcers from repeated coughing.  ILD symptom score shows worsening.  In the office  she is very hoarse   Of note she stopped her pirfenidone in February/March 2022 because of side effects - gerd  She uses oxygen with exertion.  There is no leg swelling or hemoptysis.  Started z pak yesterday Started 12d pred taper yesterday     PFT   OV 08/14/2021  Subjective:  Rhonda ID: Rhonda [name], female , DOB: [birth year] , age 54 y.o. , MRN: [id number] , ADDRESS: [address] [address] PCP Hayden Rasmussen, MD Rhonda Care Team: Hayden Rasmussen, MD as PCP - General (Family Medicine) Minus Breeding, MD as PCP - Cardiology (Cardiology) Brand Males, MD as Consulting Physician (Pulmonary Disease) Verl Blalock, Marijo Conception, MD (Inactive) (Cardiology) Kennith Center, RD as Dietitian (Family Medicine) Minus Breeding, MD as Consulting Physician (Cardiology)  This Provider for this visit: Treatment Team:  Attending Provider: Brand Males, MD  [name] chronic hypersensitive pneumonitis on prednisone 5 mg/day and also pirfenidone submaximal dose of 2 pills 3 times daily since October 2021.  On ILD-pro registry protocol - failed ofev   - failed esbrit due to side effects Faythe Ghee 2022  She is also on Breo  08/14/2021 -   Chief Complaint  Rhonda presents with   [name]    Rhonda states she is feeling better since last visit. States she did receive her lighterweight POC which has been working [name] for her.     Rhonda Rhonda [name] PEGUES 71 y.o. -seen last month with worsening symptoms acute bronchitis/flare.  Given Z-Pak and prednisone.  She is significantly better.  She tells me that even though she is better and his subjective symptom score is better below.  She still feels not back to her May 2022 baseline.  She says in the neighborhood she has to use more oxygen.  She is interested in a backpack for oxygen.  She is not able to swim as [name] as she used to.  She says acid reflux is under better control now after seeing Dr. Blenda Nicely in ENT.  She is wondering  about starting pirfenidone she definitely does not want to do nintedanib again.  But even with pirfenidone she is reluctant.  We discussed CellCept because of progression but because of the immunosuppression she is reluctant.  We resolved that she would increase the prednisone to 10 mg/day and reassess.  She was supposed to have done a spirometry today but it did not happen.  She will have it done again in 4 weeks.  Have requested a schedule for that.    The main concern right now even though is better is that she is not to her baseline as of a year ago and she feels like she is having slowly progressive disease interstitial lung disease/chronic HP.       OV 10/03/2021  Subjective:  Rhonda ID: Rhonda [name], female , DOB: [birth year] , age 18 y.o. , MRN: [id number] , AD[address]ESS: [address] [id number] PCP Hayden Rasmussen, MD Rhonda Care Team: Hayden Rasmussen, MD as PCP - General (Family Medicine) Minus Breeding, MD as PCP - Cardiology (Cardiology) Brand Males, MD as Consulting Physician (Pulmonary Disease) Verl Blalock, Marijo Conception, MD (Inactive) (Cardiology) Kennith Center, RD as Dietitian (Family Medicine) Minus Breeding, MD as Consulting Physician (Cardiology)  This Provider for this visit: Treatment Team:  Attending Provider: Brand Males, MD    10/03/2021 -   Chief Complaint  Rhonda presents with   [name]    Rhonda states her breathing has become worse since last visit. States she is having to use her O2 almost all the time now with exertion.    [name] chronic hypersensitive pneumonitis on prednisone 5 mg/day and also pirfenidone submaximal dose of 2 pills 3 times daily since October 2021.  On ILD-pro registry protocol - failed ofev due to side efect  - failed esbrit due to sde effects marvh 2022  -Last echo 2018  -Last high-res CT March 2021. -> Nov 2022 stable between 2  She is also on Breo  Rhonda REYNE FALCONI 70 y.o. -returns for  [name].  In this visit she is categorically saying that she is significantly worse.  This point in particular relationship to dyspnea on exertion.  Her weight itself is stable.  She is currently on 5 mg prednisone per day [last time I thought I increased it to 10 mg/day].  Nevertheless she says other interim changes is that she has had slow weight gain.  BMI is 30.  She started semaglutide for hemoglobin A 1.C 6.5.  Is also to help weight.  She was also after seeing Dr. Blenda Nicely for her cough and significant amount of anti-PPI therapy.  She said this made her diarrhea worse.  She then reduced it and currently is taking omeprazole 20 mg/day in the daytime and 40 mg at night and in between Pepcid as [name].  She stopped the gabapentin.  She feels after reducing the PPI the diarrhea is improved.  The cough is not as bad as it was even after stopping gabapentin but she continues to be fatigued.  She complains of bloating.  She feels her stomach is full.  She feels acid reflux is worse.  Acid reflux is worse after reducing the dose of H2 blockade.  She also has early satiety.  She also states when she coughs she vomits.  She feels she needs to see Dr. Ethlyn Gallery.  I have messaged him and he hasagreed for him or his PA to see her soon.  Nevertheless symptoms are significantly  worse as seen on the symptom score below.  She is also reporting increased tachycardia with exertion and apparently Dr. Percival Spanish adjusted her beta-blocker.  She has upcoming appointment with them.    She says that at times when she desaturates easily at home even for minimal exertion.  We walked her today with a forehead probe and she actually did much better than expected going down to 89% only at the end of 3 labs [this is actually an improvement from the recent past versus stability].  She might have an erratic finger pulse ox probe at home.   Her pulmonary function shows a significant decline in DLCO.  Last echocardiogram 2018.  Last  high-resolution CT chest March 2021.  Never had right heart catheterization.  Hemoglobin normal 14.7 g% in August 2022.           OV 10/21/2021  Subjective:  Rhonda ID: Rhonda [name], female , DOB: August 16, 1951 , age 21 y.o. , MRN: [id number] , AD[address]ESS: [address] [id number] PCP Hayden Rasmussen, MD Rhonda Care Team: Hayden Rasmussen, MD as PCP - General (Family Medicine) Minus Breeding, MD as PCP - Cardiology (Cardiology) Brand Males, MD as Consulting Physician (Pulmonary Disease) Verl Blalock, Marijo Conception, MD (Inactive) (Cardiology) Kennith Center, RD as Dietitian (Family Medicine) Minus Breeding, MD as Consulting Physician (Cardiology)  This Provider for this visit: Treatment Team:  Attending Provider: Brand Males, MD  Type of visit: Video Circumstance: COVID-19 national emergency Identification of Rhonda TAKARI DUNCOMBE with 06/09/1951 and MRN [id number] - 2 person identifier Risks: Risks, benefits, limitations of telephone visit explained. Rhonda understood and verbalized agreement to proceed Anyone else on call: no Rhonda location:  her home This provider location: [address]; [place]; [place] [address]. [facility]. [phone]    10/21/2021 -     [name] chronic hypersensitive pneumonitis on prednisone 5 mg/day and also pirfenidone submaximal dose of 2 pills 3 times daily since October 2021.  On ILD-pro registry protocol - failed ofev due to side efect  - failed esbrit due to sde effects marvh 2022  -Last echo 2018  -Last high-res CT March 2021.  She is also on Breo Rhonda RHAYNE CHATWIN 71 y.o. -returns for [name] via video visit to discuss test results.  She says currently she is just doing PPI lower dose 20 mg omeprazole in the daytime and famotidine at night.  With this her diarrhea is better.  Also bloating is better.  Correlating with this for she feels her lungs are stable.  She does say  that even now when she bends down she can desaturate.  She is not using oxygen at night.  Her primary care is testing her over no at night to see if she really needs it at night.  Nevertheless she feels somewhat better.  She had echocardiogram because of concern of pulmonary hypertension and there is no evidence of pulmonary hypertension.  She had high-resolution CT chest which the radiologist was described as minimally progressive since 18 months earlier in March 2021.  She had pulmonary function test that shows the FVC to be stable in the last few to several months but the DLCO to show slight decline.  Nevertheless the pattern at least compared to 2 years ago is 1 of progressive ILD on the pulmonary function test.  Certainly the hypoxia with easy exertion compared to a few years ago or a year ago is also a sign that her  ILD is getting worse.  She is likely stable or stabilized in the last few to several months.  We discussed going challenge again with nintedanib or pirfenidone but she is not interested.  We discussed doing CellCept and discussed the side effect profile of this.  She is not interested.  We discussed transplant referral for evaluation now that she is progressing so she can get plugged in and have a good conversation.  She is reflected on this from the last visit and she is not interested  We discussed the acid reflux controlled she feels she is at a good place right now with good acid reflux controlled with PPI in the morning and H2 blockade at night.  She has seen GI and at this point I believe they are not going to do pH probe.  I reviewed the note.  She continues on Breo and prednisone 5 mg/day which she plans to continue.  She has heard about the promedior trial with IV Pnetraxin for IPF.  This might be a study for non-- IPF progressive phenotype.  She prefers to do the study because it bypasses the GI route and it is an IV study.  We do not know if the study is going to happen.  We do  not know if you are going to be a site.  If these things all get aligned then maybe we could consider that.        OV 07/21/2022  Subjective:  Rhonda ID: Rhonda [name], female , DOB: 1951/02/25 , age 33 y.o. , MRN: [id number] , AD[address]ESS: [address] [id number] PCP Hayden Rasmussen, MD Rhonda Care Team: Hayden Rasmussen, MD as PCP - General (Family Medicine) Minus Breeding, MD as PCP - Cardiology (Cardiology) Brand Males, MD as Consulting Physician (Pulmonary Disease) Verl Blalock, Marijo Conception, MD (Inactive) (Cardiology) Kennith Center, RD as Dietitian (Family Medicine) Minus Breeding, MD as Consulting Physician (Cardiology)  This Provider for this visit: Treatment Team:  Attending Provider: Brand Males, MD   [name] chronic hypersensitive pneumonitis on prednisone 5 mg/day and also pirfenidone submaximal dose of 2 pills 3 times daily since October 2021.  On ILD-pro registry protocol - failed ofev due to side efect  - failed esbrit due to sde effects marvh 2022  -Last echo 2018  -Last high-res CT March 2021.  0- declind lung tx referral 2023   She is also on Breo  07/21/2022 -   Chief Complaint  Rhonda presents with   [name]    PFT performed today.  Rhonda states she has been doing good since last visit.     Rhonda JAZMYNN PHO 71 y.o. -returns for [name].  Overall in the summer months she is doing [name].  She went to the [place] in [place].  In the areas where [place] was [place] and clean air she was able to walk down the beach 200 steps and including climb up with the oxygen without desaturation and feeling good.  But when she got to [facility] where there was pollution she started having cough.  She currently feels really good.  She feels [name] symptom scores are improved.  Walking desaturation test is also improved.  She attributes this to some weight loss and overall healthy living.  She is on Ozempic  that is helping her lose weight.  She is swimming.  She does have some cough recently for the last week because of allergies.  Her dog Ival Bible passed away.  Therefore she is taking care of the other dog and this is increasing her allergies and cough but still overall symptoms are improved.  She continues on prednisone 5 mg/day.  Currently she not interested in any prednisone burst.  Social: She is expecting to be a grandmother in a few months.    SYMPTOM SCALE - ILD Sept 2020 01/30/2020  08/29/2020  01/14/2021 146# 07/08/2021 147# - off esbriet. Sic past week 08/14/2021 149# - pred only. No antifibrotic 10/03/2021 148# - pred 34m 07/21/2022 137#  O2 use  o2 with ex o2 withe ex 2L     2LL o2 with ex 2L with ex  Shortness of Breath  0 -> 5 scale with 5 being worst (score 6 If unable to do)        At rest 0 0 1 0 1 0 1 0  Simple tasks - showers, clothes change, eating, shaving 01 1 1 1.'5 2 1 2 1  ' Household (dishes, doing bed, laundry) 0 '3 3 3 4 2 ' 3.5 2  Shopping '1 1 1 2 2 1 1 1  ' Walking level at own pace '1 2 2 1 2 2 3 1  ' Walking up Stairs '2 3 3 3 4 3 4 3  ' Total (30-36) Dyspnea Score '5 10 11 ' 10.'5 15 9 ' 14.5 8  How bad is your cough? '1 3 2 2 2 2 2 2  ' How bad is your fatigue '1 2 2 3 2 ' 0 3 1  How bad is nausea  0 0 3 0 0 1 0  How bad is vomiting?   0 0 0 0 0 1 0  How bad is diarrhea?  00 1 0 0 0 1 0  How bad is anxiety?  0 0 0 00 0 2 0  How bad is depression  0 '2 1 1 ' 0 1 0  0 0 0 Simple office walk 185 feet x  3 laps goal with forehead probe 04/13/2018  08/16/2018  12/15/2018  05/09/2019  08/22/2019  01/30/2020  08/29/2020  01/14/2021  07/08/2021  08/14/2021  10/03/2021  07/21/2022   O2 used Room air Room air Room air Room air Room air Room air ra ra ra ra ra ra  Number laps completed '3 3 3 3 2 ' stopped at 2 die to hip pain 3 laps - no hip pain following hip surgery '3 3 3 ' attempted byt did  only 2 3 bu stopped at 2 3 and did all 3 3  Comments about pace good Moderate pace Normal, hip bothering      avg pace avg  avg avg  Resting Pulse Ox/HR 98% and 73/min 98% and HR 77/min 99% and HR 61/min 98% and HR 70/min 98% 98% and 75/min 97% and 67/mi 94% and 80/min 96% and HR 72 98% and 71 100% and HR 82 98% ad HR 72  Final Pulse Ox/HR 91% and 91/min 93% and 92.min 94% and 92/min 93% and 98/min 91%  89% and 93/,imn 88% and 94 89% ad 88.nin 88% at 2nd  lap end HR 92 86% and 96 89% and HR 91 93% and HR 87  Desaturated </= 88% no no no no   yes almost yes yes almst no  Desaturated <= 3% points yes yes Yes, 5 points Yes, 5 points    Yes, 5 points Yes, 8 points Yes, 12 pints Yes 11 pot 5 pots  Got Tachycardic >/= 90/min yes yes yes yes  yes yes yes no  Symptoms at end of test none none Hip pain and very mild dyspnea  Stopped due to hip pain Moderate duyspnea with mask Mild to moderate dyspnea  Severe dyspnea Mild dyspnea Severe dyspnea Very mild dyspneax  Miscellaneous comments x x    No hip pain    Similar to lastime Symptoms out of proportion ? better     PFT     Latest Ref Rng & Units 07/21/2022    9:50 AM 01/06/2022    9:51 AM 10/03/2021    8:59 AM 01/14/2021    1:51 PM 11/06/2020   11:03 AM 07/18/2020   10:53 AM 01/29/2020   12:53 PM  PFT Results  FVC-Pre L 1.54  P 1.58  1.48  1.49  1.41  1.47  1.39   FVC-Predicted Pre % 67  P 67  63  62  59  62  57   Pre FEV1/FVC % % 91  P 90  92  92  93  88  91   FEV1-Pre L 1.40  P 1.43  1.37  1.37  1.31  1.30  1.26   FEV1-Predicted Pre % 81  P 81  78  77  73  73  69   DLCO uncorrected ml/min/mmHg 8.16  P 8.62  8.92  11.26  9.70  11.19  9.36   DLCO UNC% % 50  P 53  55  69  59  68  57   DLCO corrected ml/min/mmHg 7.86  P 8.62  8.92  11.26  10.53  11.19  9.36   DLCO COR %Predicted % 48  P 53  55  69  64  68  66   DLVA Predicted % 64  P 75  78  92  89  90  81     P Preliminary result       has a past medical history of Allergy, Arthritis, Asthma, Cataract, Coronary artery calcification seen on CAT scan (08/19/2017), DDD (degenerative disc disease),  thoracic, Depression, DOE (dyspnea on exertion), Fatty liver, GERD (gastroesophageal reflux disease), H/O steroid therapy, Heart palpitations (50/27/7412), Helicobacter pylori ab+, Hemorrhoids, Hiatal hernia, High cholesterol, History of chronic bronchitis, History of migraine, History of MRSA infection, Hyperlipidemia, mixed (08/19/2017), Hyperplastic colon polyp (2007), IBS (irritable bowel syndrome), Inguinal hernia, Insulin resistance, Interstitial lung disease ([place]), MVP (mitral valve prolapse), Pneumonia, Pneumonitis, hypersensitivity (HCC), PONV (postoperative nausea and vomiting), Pre-diabetes, Pulmonary fibrosis (HCC), PVC (premature ventricular contraction) (08/19/2017), Rapid heart rate, Squamous cell carcinoma of skin, Tinnitus, right ear, and Vocal cord ulcer.   reports that she has never smoked. She has never used smokeless tobacco.  Past Surgical History:  Procedure Laterality Date   65 HOUR [place] STUDY N/A 02/21/2018   Procedure: 24 HOUR PH STUDY;  Surgeon: Mauri Pole, MD;  Location: [facility];  Service: Endoscopy;  Laterality: N/A;   BREAST BIOPSY Right 2009   BIOPSY, Rhonda denies   BREAST BIOPSY Left 2003   Benign    CATARACT EXTRACTION Left    CESAREAN SECTION     COLONOSCOPY     ESOPHAGEAL MANOMETRY N/A 02/21/2018   Procedure: ESOPHAGEAL MANOMETRY (EM);  Surgeon: Mauri Pole, MD;  Location: [facility];  Service: Endoscopy;  Laterality: N/A;   FOOT FRACTURE SURGERY  2006 or 2007   right   HYMENECTOMY     LUNG BIOPSY  09/28/2012   Procedure: LUNG BIOPSY;  Surgeon: Melrose Nakayama, MD;  Location: [facility];  Service: Thoracic;  Laterality: N/A;  lung biopsies tims three   SQUAMOUS CELL CARCINOMA EXCISION Left    left arm   TOTAL HIP ARTHROPLASTY Right 09/10/2015   Procedure: RIGHT TOTAL HIP ARTHROPLASTY ANTERIOR APPROACH;  Surgeon: Paralee Cancel, MD;  Location: [facility];  Service: Orthopedics;  Laterality: Right;   TOTAL HIP ARTHROPLASTY Left 10/03/2019    Procedure: TOTAL HIP ARTHROPLASTY ANTERIOR APPROACH;  Surgeon: Paralee Cancel, MD;  Location: [facility];  Service: Orthopedics;  Laterality: Left;  70 mins   TUBAL LIGATION     UPPER GI ENDOSCOPY     VIDEO ASSISTED THORACOSCOPY  09/28/2012   Procedure: VIDEO ASSISTED THORACOSCOPY;  Surgeon: Melrose Nakayama, MD;  Location: [facility];  Service: Thoracic;  Laterality: Right;   VIDEO BRONCHOSCOPY  11/19/2011   Procedure: VIDEO BRONCHOSCOPY WITH FLUORO;  Surgeon: Brand Males, MD;  Location: [facility];  Service: Endoscopy;;    Allergies  Allergen Reactions   Atorvastatin Other (See Comments)    Leg pain   Betadine [Povidone Iodine] Other (See Comments)    blisters   Codeine Nausea And Vomiting   Garlic Diarrhea   Hydrocodone Nausea And Vomiting   Macrobid [Nitrofurantoin] Other (See Comments)   Ofev [Nintedanib] Other (See Comments)    Abdominal pain   Onion Diarrhea   Other Other (See Comments)   Rosuvastatin Other (See Comments)    Leg pain   Shellfish Allergy Nausea And Vomiting   Sulfa Antibiotics    Sulfonamide Derivatives Other (See Comments)    headaches    Immunization History  Administered Date(s) Administered   Fluad Quad(high Dose 65+) 07/17/2019   Hepatitis A 05/23/2010   Hepatitis B 06/23/2006   Influenza Split 08/11/2012, 08/14/2017   Influenza Whole 09/08/2011   Influenza, High Dose Seasonal PF 08/24/2016, 07/24/2017, 08/31/2018, 08/15/2020, 09/26/2020, 09/12/2021   Influenza,inj,Quad PF,6+ Mos 09/07/2013   Influenza-Unspecified 09/26/2014, 07/27/2015   MMR 05/23/2010   Moderna Sars-Covid-2 Vaccination 07/30/2021, 01/01/2022   PFIZER(Purple Top)SARS-COV-2 Vaccination 12/16/2019, 01/08/2020, 07/18/2020   Pneumococcal Conjugate-13 03/15/2014   Pneumococcal Polysaccharide-23 01/11/2018, 09/26/2020, 08/26/2021   Td 02/22/2003   Tdap 10/28/2012   Zoster Recombinat (Shingrix) 02/15/2017, 05/17/2017   Zoster, Live 12/04/2010    Family History  Problem  Relation Age of Onset   Asthma Mother    Osteoarthritis Mother    Dementia Mother 15   Lymphoma Father    Diabetes Father    Hypertension Sister    Heart disease Sister    Kidney disease Sister    Allergic rhinitis Daughter    Ovarian cancer Maternal Aunt    Breast cancer Paternal Aunt    Breast cancer Paternal Aunt    Emphysema Maternal Grandmother    Asthma Maternal Grandmother    Lung disease Maternal Grandfather    Bone cancer Paternal Grandfather    Breast cancer Cousin    Breast cancer Cousin    Breast cancer Cousin    Colon cancer Neg Hx    Angioedema Neg Hx    Eczema Neg Hx    Immunodeficiency Neg Hx    Urticaria Neg Hx      Current Outpatient Medications:    acetaminophen (TYLENOL) 500 MG tablet, Tylenol Extra Strength, Disp: , Rfl:    albuterol (PROAIR HFA) 108 (90 Base) MCG/ACT inhaler, Inhale 2 puffs into the lungs every 4 (four) hours as needed for wheezing. Or coughing spells.  You may use 2 Puffs 5-10 minutes before exercise., Disp: 1 Inhaler, Rfl: 3   Alpha-D-Galactosidase (BEANO PO), Take 1 tablet  by mouth as needed (if eating onion or garlic). , Disp: , Rfl:    Ascorbic Acid (VITAMIN C) 1000 MG tablet, Take 1,000 mg by mouth daily., Disp: , Rfl:    ASHWAGANDHA PO, Take 2 capsules by mouth daily., Disp: , Rfl:    azelastine (ASTELIN) 0.1 % nasal spray, Place 1 spray into both nostrils at bedtime., Disp: , Rfl:    chlorpheniramine (CHLOR-TRIMETON) 4 MG tablet, Take 2 mg by mouth 2 (two) times daily. Takes half tablet twice daily, Disp: , Rfl:    dicyclomine (BENTYL) 10 MG capsule, Take 1 capsule by mouth daily as needed., Disp: , Rfl:    ezetimibe (ZETIA) 10 MG tablet, Take 1 tablet (10 mg total) by mouth daily. Please keep upcoming appt for future refills. Thank you., Disp: 90 tablet, Rfl: 3   fluticasone (FLONASE) 50 MCG/ACT nasal spray, Spray 2 sprays into each nostril every day., Disp: 48 mL, Rfl: 1   fluticasone furoate-vilanterol (BREO ELLIPTA) 100-25  MCG/ACT AEPB, Inhale 1 puff into the lungs daily., Disp: 180 each, Rfl: 3   lactase (LACTAID) 3000 units tablet, Take by mouth., Disp: , Rfl:    levocetirizine (XYZAL) 5 MG tablet, Take 5 mg by mouth at bedtime., Disp: , Rfl:    metoprolol succinate (TOPROL-XL) 50 MG 24 hr tablet, Take 1.5 tablets (75 mg total) by mouth daily., Disp: 135 tablet, Rfl: 3   montelukast (SINGULAIR) 10 MG tablet, TAKE 1 TABLET BY MOUTH EVERYDAY AT BEDTIME, Disp: 90 tablet, Rfl: 2   Multiple Vitamin (MULTIVITAMIN) tablet, Take 1 tablet by mouth daily., Disp: , Rfl:    nystatin-triamcinolone (MYCOLOG II) cream, nystatin-triamcinolone 100,000 unit/g-0.1 % topical cream, Disp: , Rfl:    omeprazole (PRILOSEC OTC) 20 MG tablet, Take 20 mg by mouth daily., Disp: , Rfl:    OXYGEN, Inhale into the lungs. 2 to 3 liters as needed during the day and nite time use on/off, Disp: , Rfl:    OZEMPIC, 0.25 OR 0.5 MG/DOSE, 2 MG/1.5ML SOPN, Inject into the skin., Disp: , Rfl:    pravastatin (PRAVACHOL) 40 MG tablet, Take 1 tablet (40 mg total) by mouth every evening., Disp: 90 tablet, Rfl: 1   predniSONE (DELTASONE) 5 MG tablet, Take 1 tablet (5 mg total) by mouth daily with breakfast., Disp: 90 tablet, Rfl: 3   Sodium Chloride, Inhalant, 7 % NEBU, Use per nebulizer once a day to help clear sputum, Disp: 120 mL, Rfl: 3   EPINEPHrine 0.3 mg/0.3 mL IJ SOAJ injection, Inject into the muscle. (Rhonda not taking: Reported on 01/06/2022), Disp: , Rfl:    nitroGLYCERIN (NITROSTAT) 0.4 MG SL tablet, Place 1 tablet (0.4 mg total) under the tongue every 5 (five) minutes as needed for chest pain., Disp: 25 tablet, Rfl: 12      Objective:   Vitals:   07/21/22 1028  BP: 112/64  Pulse: 75  Temp: 98.6 F (37 C)  TempSrc: Oral  SpO2: 95%  Weight: 137 lb (62.1 kg)  Height: '4\' 11"'  (1.499 m)    Estimated body mass index is 27.67 kg/m as calculated from the following:   Height as of this encounter: '4\' 11"'  (1.499 m).   Weight as of this  encounter: 137 lb (62.1 kg).  '@WEIGHTCHANGE' @  [employer]   07/21/22 1028  Weight: 137 lb (62.1 kg)     Physical ExamGeneral: No distress. Looks [name] Neuro: Alert and Oriented x 3. GCS 15. Speech normal Psych: Pleasant Resp:  Barrel Chest - no.  Wheeze - no, Crackles - yes but ? improved, No overt respiratory distress CVS: Normal heart sounds. Murmurs - no Ext: Stigmata of Connective Tissue Disease - no HEENT: Normal upper airway. PEERL +. No post nasal drip        Assessment:       ICD-10-CM   1. Hypersensitivity pneumonitis ([place])  J67.9     2. ILD (interstitial lung disease) ([place])  J84.9     3. Gastroesophageal reflux disease, unspecified whether esophagitis present  K21.9     4. Hiatal hernia  K44.9          Plan:     Rhonda Instructions     ICD-10-CM   1. Hypersensitivity pneumonitis ([place])  J67.9     2. ILD (interstitial lung disease) ([place])  J84.9     3. Gastroesophageal reflux disease, unspecified whether esophagitis present  K21.9     4. Hiatal hernia  K44.9          Pulmonary Fibrosis is every so steadily worse over years but Aug 2023 seems similar to recent PFTs  Currently on prednisone 5m per day   Noted good weight loss from ozempic  Plan  -gerd managmene by GI as before  -- continue o2 for pulse ox goal > 88% - stay on prednisome 564mper day - continue  - ILD-PRO registry per protocol - high dose flu, rsv, and covid mRNA booster in fall 2023   Followup -6 months to spirometry and DLCO and return to see Dr. RaChase Calleror a face-to-face visit 30-minute slot     SIGNATURE    Dr. MuBrand MalesM.D., F.C.C.P,  Pulmonary and Critical Care Medicine Staff Physician, [place] Plainsirector - Interstitial Lung Disease  Program  Pulmonary Fi[id number]: [phone] no answer or between  15:00h - 7:00h: call [phone] Telephone:  [phone]  11:02 AM 07/21/2022

## 2022-07-21 NOTE — Patient Instructions (Addendum)
ICD-10-CM   1. Hypersensitivity pneumonitis ([place])  J67.9     2. ILD (interstitial lung disease) ([place])  J84.9     3. Gastroesophageal reflux disease, unspecified whether esophagitis present  K21.9     4. Hiatal hernia  K44.9          Pulmonary Fibrosis is every so steadily worse over years but Aug 2023 seems similar to recent PFTs  Currently on prednisone '5mg'$  per day   Noted good weight loss from ozempic  Plan  -gerd managmene by GI as before  -- continue o2 for pulse ox goal > 88% - stay on prednisome '5mg'$  per day - continue  - ILD-PRO registry per protocol - high dose flu, rsv, and covid mRNA booster in fall 2023   Followup -6 months to spirometry and DLCO and return to see Dr. Chase Caller for a face-to-face visit 30-minute slot

## 2022-07-29 ENCOUNTER — Encounter: Payer: Self-pay | Admitting: Internal Medicine

## 2022-08-10 ENCOUNTER — Encounter: Payer: Self-pay | Admitting: Internal Medicine

## 2022-08-10 NOTE — Telephone Encounter (Signed)
Per protocol, need to speak with the pt regarding her acute symptoms  LMTCB

## 2022-08-10 NOTE — Telephone Encounter (Signed)
Received the following email from the pt:   I am having a flare up lots of coughing. Lots of phlegm. Some is greenish yellow. Flare up has been going on about 10 days. Would Dr R be willing to call in a burst of prednisone and z pack?  [employer]. I can come in if he wants.    Called and spoke with the pt  She is c/o cough with yellow/green sputum for past 10 days  Started with scratchy throat  She is having less cough today then when first started  Has taken 3 covid test- most recent was yesterday and all were neg  She has been feeling more SOB  Was wheezing at first but not now  No f/c/s  She is asking for pred and abx   Also, wants to know if she can go ahead and get covid vaccine now and then take pred/abx She has a party coming up and wants to be vaccinated with latest booster before this   Please advise, thanks! Also, I went over meds and she is taking everything same as last ov Allergies  Allergen Reactions   Atorvastatin Other (See Comments)    Leg pain   Betadine [Povidone Iodine] Other (See Comments)    blisters   Codeine Nausea And Vomiting   Garlic Diarrhea   Hydrocodone Nausea And Vomiting   Macrobid [Nitrofurantoin] Other (See Comments)   Ofev [Nintedanib] Other (See Comments)    Abdominal pain   Onion Diarrhea   Other Other (See Comments)   Rosuvastatin Other (See Comments)    Leg pain   Shellfish Allergy Nausea And Vomiting   Sulfa Antibiotics    Sulfonamide Derivatives Other (See Comments)    headaches

## 2022-08-10 NOTE — Telephone Encounter (Signed)
Patient returning nurses call

## 2022-08-10 NOTE — Telephone Encounter (Signed)
Called her and had to [facility] She sent another mychart msg with acute symptoms that we need to inquire further on  Will await her call back

## 2022-08-10 NOTE — Telephone Encounter (Signed)
Please see other mychart encounter dated today- discussed with pt and sent msg to MR on the other email she sent. Will close this encounter.

## 2022-08-11 MED ORDER — PREDNISONE 10 MG PO TABS: ORAL_TABLET | ORAL | 0 refills | Status: AC

## 2022-08-11 MED ORDER — AZITHROMYCIN 250 MG PO TABS: ORAL_TABLET | ORAL | 0 refills | Status: DC

## 2022-08-11 NOTE — Telephone Encounter (Signed)
Called and spoke with patient. She verbalized understanding. She stated that she is starting to feel better but would like to have the prednisone and zpak on hand just in case she begins to feel bad. RX has been sent. Nothing further needed at time of call.

## 2022-08-11 NOTE — Telephone Encounter (Signed)
Take prednisone 40 mg daily x 2 days, then prednisone '30mg'$  daily x 2 days, then '20mg'$  daily x 2 days, then '10mg'$  daily x 2 days, then '5mg'$  daily x 2 days and then go to baseline dose   Z PAK   Hold covid vaccine or any vaccine while ill

## 2022-09-28 ENCOUNTER — Telehealth: Payer: Self-pay | Admitting: Cardiology

## 2022-09-28 NOTE — Telephone Encounter (Signed)
Patient stated she has irregular beats at time with fatigue. Her sob and orthostatic hypotension has not changed. Supplemental O2 prn. O2 sat in 90s and with exertion 80s, but after rest, back to 90s. Change appointment with Dr. Percival Spanish from 11/30 to 11/10. Recommended to patient if her sob or palpitations worsen, to be taken to the ED. She verbalized understanding.

## 2022-09-28 NOTE — Telephone Encounter (Signed)
Patient c/o Palpitations:  High priority if patient c/o lightheadedness, shortness of breath, or chest pain  How long have you had palpitations/irregular HR/ Afib? Are you having the symptoms now? Has been going on for a few weeks,is having symptoms now   Are you currently experiencing lightheadedness, SOB or CP? No   Do you have a history of afib (atrial fibrillation) or irregular heart rhythm? Yes   Have you checked your BP or HR? (document readings if available): Yes BP normal HR fluctuates very irregular especially on exertion.   Are you experiencing any other symptoms? SOB on exertion and occasional minor CP     Patient has ECG's from [employer] she is able to send in if needed. She is wanting to know if she should be seen sooner in regards to this.

## 2022-09-30 NOTE — Progress Notes (Signed)
Cardiology Office Note   Date:  10/02/2022   ID:  Rhonda [name], DOB 1951/06/07, MRN [id number]  PCP:  Hayden Rasmussen, MD  Cardiologist:   Minus Breeding, MD   Chief Complaint  Patient presents with   Palpitations      History of Present Illness: Rhonda [name] is a 71 y.o. female who presents for follow up of coronary calcium and PVCs.  She had a normal nuclear stress test in 2020.  She has premature ventricular contractions.  She has chronic interstitial lung disease.  She does have home O2.  Her calcium score was 343.    She returns for follow up.  Her breathing has been worse.  She says this is really seasonal and does get worse in the autumn.  She has been on steroid tapers.  She has had more fatigue.  She is not sleeping particularly well.  She does have a grandchild on the way and is a high risk pregnancy.  She has had more palpitations.  She is wearing her monitor that demonstrates premature atrial contractions.  There is no atrial fibrillation ventricular ectopy.  This is the symptoms that she is describing.  Is happening more frequently.  She has not had any presyncope or syncope.  She gets some fleeting shooting chest discomfort but this is not really new since she had a stress test.  She does have the elevated calcium as mentioned but she had a normal echo last year.  In 2020 she had negative perfusion study.  Chest pain symptoms really have not changed since then.  The shortness of breath might be slowly progressive but she does have some interstitial lung disease  Past Medical History:  Diagnosis Date   Allergy    Arthritis    history spinal stenosis. osteoarthritis right hip   Asthma    Cataract    Coronary artery calcification seen on CAT scan 08/19/2017   >300 on CT scan 08/2017   DDD (degenerative disc disease), thoracic    Depression    DOE (dyspnea on exertion)    a. 04/2010 Lexi MV EF 71%, no ischemia/infarct;     Fatty liver    GERD  (gastroesophageal reflux disease)    H/O steroid therapy    Steroid use orally over 4 yrs- for Lung Fibrosis   Heart palpitations 09/32/6712   Helicobacter pylori ab+    Hemorrhoids    Hiatal hernia    High cholesterol    History of chronic bronchitis    as child   History of migraine    History of MRSA infection    Hyperlipidemia, mixed 08/19/2017   Hyperplastic colon polyp 2007   IBS (irritable bowel syndrome)    Inguinal hernia    right   Insulin resistance    past   Interstitial lung disease (HCC)    MVP (mitral valve prolapse)    Posterior mitral valve leaflet with mild MR   Pneumonia    Pneumonitis, hypersensitivity ([place])    a. 09/2012 s/p Bx - ? 2/2 bird, mold, oil paint exposure ->on steroids, followed by pulm.   PONV (postoperative nausea and vomiting)    Pre-diabetes    takes Metformin   Pulmonary fibrosis (HCC)    Dr. Chase Caller follows- stable at present   PVC (premature ventricular contraction) 08/19/2017   Rapid heart rate    Dr. Radford Pax follows- last visit Epic note 9'16   Squamous cell carcinoma of skin    Tinnitus, right  ear    Vocal cord ulcer     Past Surgical History:  Procedure Laterality Date   45 HOUR [place] STUDY N/A 02/21/2018   Procedure: 24 HOUR PH STUDY;  Surgeon: Mauri Pole, MD;  Location: [facility];  Service: Endoscopy;  Laterality: N/A;   BREAST BIOPSY Right 2009   BIOPSY, pt denies   BREAST BIOPSY Left 2003   Benign    CATARACT EXTRACTION Left    CESAREAN SECTION     COLONOSCOPY     ESOPHAGEAL MANOMETRY N/A 02/21/2018   Procedure: ESOPHAGEAL MANOMETRY (EM);  Surgeon: Mauri Pole, MD;  Location: [facility];  Service: Endoscopy;  Laterality: N/A;   FOOT FRACTURE SURGERY  2006 or 2007   right   HYMENECTOMY     LUNG BIOPSY  09/28/2012   Procedure: LUNG BIOPSY;  Surgeon: Melrose Nakayama, MD;  Location: [place];  Service: Thoracic;  Laterality: N/A;  lung biopsies tims three   SQUAMOUS CELL CARCINOMA EXCISION Left     left arm   TOTAL HIP ARTHROPLASTY Right 09/10/2015   Procedure: RIGHT TOTAL HIP ARTHROPLASTY ANTERIOR APPROACH;  Surgeon: Paralee Cancel, MD;  Location: [facility];  Service: Orthopedics;  Laterality: Right;   TOTAL HIP ARTHROPLASTY Left 10/03/2019   Procedure: TOTAL HIP ARTHROPLASTY ANTERIOR APPROACH;  Surgeon: Paralee Cancel, MD;  Location: [facility];  Service: Orthopedics;  Laterality: Left;  70 mins   TUBAL LIGATION     UPPER GI ENDOSCOPY     VIDEO ASSISTED THORACOSCOPY  09/28/2012   Procedure: VIDEO ASSISTED THORACOSCOPY;  Surgeon: Melrose Nakayama, MD;  Location: [facility];  Service: Thoracic;  Laterality: Right;   VIDEO BRONCHOSCOPY  11/19/2011   Procedure: VIDEO BRONCHOSCOPY WITH FLUORO;  Surgeon: Brand Males, MD;  Location: [facility];  Service: Endoscopy;;     Current Outpatient Medications  Medication Sig Dispense Refill   acetaminophen (TYLENOL) 500 MG tablet Tylenol Extra Strength     albuterol (PROAIR HFA) 108 (90 Base) MCG/ACT inhaler Inhale 2 puffs into the lungs every 4 (four) hours as needed for wheezing. Or coughing spells.  You may use 2 Puffs 5-10 minutes before exercise. 1 Inhaler 3   Alpha-D-Galactosidase (BEANO PO) Take 1 tablet by mouth as needed (if eating onion or garlic).      Ascorbic Acid (VITAMIN C) 1000 MG tablet Take 1,000 mg by mouth daily.     ASHWAGANDHA PO Take 2 capsules by mouth daily.     azelastine (ASTELIN) 0.1 % nasal spray Place 1 spray into both nostrils at bedtime.     chlorpheniramine (CHLOR-TRIMETON) 4 MG tablet Take 2 mg by mouth 2 (two) times daily. Takes half tablet twice daily     dicyclomine (BENTYL) 10 MG capsule Take 1 capsule by mouth daily as needed.     diltiazem (CARDIZEM CD) 120 MG 24 hr capsule Take 1 capsule (120 mg total) by mouth daily. 90 capsule 3   EPINEPHrine 0.3 mg/0.3 mL IJ SOAJ injection Inject into the muscle.     ezetimibe (ZETIA) 10 MG tablet Take 1 tablet (10 mg total) by mouth daily. Please keep upcoming appt for future  refills. Thank you. 90 tablet 3   fluticasone (FLONASE) 50 MCG/ACT nasal spray Spray 2 sprays into each nostril every day. 48 mL 1   fluticasone furoate-vilanterol (BREO ELLIPTA) 100-25 MCG/ACT AEPB Inhale 1 puff into the lungs daily. 180 each 3   lactase (LACTAID) 3000 units tablet Take by mouth.     levocetirizine (XYZAL) 5  MG tablet Take 5 mg by mouth at bedtime.     montelukast (SINGULAIR) 10 MG tablet TAKE 1 TABLET BY MOUTH EVERYDAY AT BEDTIME 90 tablet 2   Multiple Vitamin (MULTIVITAMIN) tablet Take 1 tablet by mouth daily.     nystatin-triamcinolone (MYCOLOG II) cream nystatin-triamcinolone 100,000 unit/g-0.1 % topical cream     omeprazole (PRILOSEC OTC) 20 MG tablet Take 20 mg by mouth daily.     OXYGEN Inhale into the lungs. 2 to 3 liters as needed during the day and nite time use on/off     OZEMPIC, 0.25 OR 0.5 MG/DOSE, 2 MG/1.5ML SOPN Inject into the skin.     pravastatin (PRAVACHOL) 40 MG tablet Take 1 tablet (40 mg total) by mouth every evening. 90 tablet 1   predniSONE (DELTASONE) 5 MG tablet Take 1 tablet (5 mg total) by mouth daily with breakfast. 90 tablet 3   Sodium Chloride, Inhalant, 7 % NEBU Use per nebulizer once a day to help clear sputum 120 mL 3   azithromycin (ZITHROMAX) 250 MG tablet Take 2 tablets on first day, then 1 tablet daily until finished. (Patient not taking: Reported on 10/02/2022) 6 tablet 0   nitroGLYCERIN (NITROSTAT) 0.4 MG SL tablet Place 1 tablet (0.4 mg total) under the tongue every 5 (five) minutes as needed for chest pain. 25 tablet 12   No current facility-administered medications for this visit.    Allergies:   Atorvastatin, Betadine [povidone iodine], Codeine, Garlic, Hydrocodone, Macrobid [nitrofurantoin], Ofev [nintedanib], Onion, Other, Rosuvastatin, Shellfish allergy, Sulfa antibiotics, and Sulfonamide derivatives    ROS:  Please see the history of present illness.   Otherwise, review of systems are positive for none.   All other systems are  reviewed and negative.    PHYSICAL EXAM: VS:  BP 136/68 (BP Location: Left Arm, Patient Position: Sitting, Cuff Size: Normal)   Pulse 69   Ht 5' (1.524 m)   Wt 137 lb 12.8 oz (62.5 kg)   SpO2 97%   BMI 26.91 kg/m  , BMI Body mass index is 26.91 kg/m. GENERAL:  Well appearing NECK:  No jugular venous distention, waveform within normal limits, carotid upstroke brisk and symmetric, no bruits, no thyromegaly LUNGS:  Clear to auscultation bilaterally CHEST: Diffuse scattered fine crackles HEART:  PMI not displaced or sustained,S1 and S2 within normal limits, no S3, no S4, no clicks, no rubs, no murmurs ABD:  Flat, positive bowel sounds normal in frequency in pitch, no bruits, no rebound, no guarding, no midline pulsatile mass, no hepatomegaly, no splenomegaly EXT:  2 plus pulses throughout, no edema, no cyanosis no clubbing   EKG:  EKG is  ordered today. Sinus rhythm, rate 69, axis within normal limits, intervals within normal limits, nonspecific anterior T wave changes.  EKG is unchanged from previous.  Recent Labs: No results found for requested labs within last 365 days.    Lipid Panel    Component Value Date/Time   CHOL 177 12/08/2018 0917   TRIG 131 12/08/2018 0917   HDL 68 12/08/2018 0917   CHOLHDL 2.6 12/08/2018 0917   CHOLHDL 3.5 02/26/2015 1540   VLDL 37 02/26/2015 1540   LDLCALC 83 12/08/2018 0917      Wt Readings from Last 3 Encounters:  10/02/22 137 lb 12.8 oz (62.5 kg)  07/21/22 137 lb (62.1 kg)  01/06/22 140 lb 3.2 oz (63.6 kg)      Other studies Reviewed: Additional studies/ records that were reviewed today include: Pulmonary records Review of the  above records demonstrates:  Please see elsewhere in the note.     ASSESSMENT AND PLAN:  Coronary artery calcifications on CT:   I do not think her known coronary calcium represents necessarily obstructive disease leading to her shortness of breath.  She certainly has pulmonary disease as a potential cause of  her shortness of breath.  However, if in the future this is further managed and she is getting worse rather than better I will consider further stress testing or imaging of her coronary arteries.   Palpitations: She has PACs.  I want to see if getting her off metoprolol helps her breathing since she has some reactive airway disease and I am going to switch her to Cardizem CD 120 mg daily.    Dyslipidemia:    LDL was 57 with an HDL of 62.  No change in therapy.   Current medicines are reviewed at length with the patient today.  The patient does not have concerns regarding medicines.  The following changes have been made:  As above  Labs/ tests ordered today include:    Orders Placed This Encounter  Procedures   EKG 12-Lead    Disposition:   FU with me in 2 months.     Signed, Minus Breeding, MD  10/02/2022 10:58 AM    [facility]

## 2022-10-02 ENCOUNTER — Encounter: Payer: Self-pay | Admitting: Cardiology

## 2022-10-02 ENCOUNTER — Ambulatory Visit: Payer: Medicare PPO | Attending: Cardiology | Admitting: Cardiology

## 2022-10-02 VITALS — BP 136/68 | HR 69 | Ht 60.0 in | Wt 137.8 lb

## 2022-10-02 DIAGNOSIS — I2510 Atherosclerotic heart disease of native coronary artery without angina pectoris: Secondary | ICD-10-CM | POA: Diagnosis not present

## 2022-10-02 DIAGNOSIS — I493 Ventricular premature depolarization: Secondary | ICD-10-CM

## 2022-10-02 DIAGNOSIS — E785 Hyperlipidemia, unspecified: Secondary | ICD-10-CM | POA: Diagnosis not present

## 2022-10-02 MED ORDER — DILTIAZEM HCL ER COATED BEADS 120 MG PO CP24: 120.0000 mg | ORAL_CAPSULE | Freq: Every day | ORAL | 3 refills | Status: DC

## 2022-10-02 NOTE — Patient Instructions (Addendum)
Medication Instructions:  Decrease Metoprolol-  Start with 50 mg for 1 day, then decrease to 25 mg for 1 day (START CARDIZEM ON THIS DAY), then STOP Metoprolol  START Cardizem 120 mg daily   *If you need a refill on your cardiac medications before your next appointment, please call your pharmacy*   Follow-Up: At [facility], you and your health needs are our priority.  As part of our continuing mission to provide you with exceptional heart care, we have created designated Provider Care Teams.  These Care Teams include your primary Cardiologist (physician) and Advanced Practice Providers (APPs -  Physician Assistants and Nurse Practitioners) who all work together to provide you with the care you need, when you need it.  We recommend signing up for the patient portal called "MyChart".  Sign up information is provided on this After Visit Summary.  MyChart is used to connect with patients for Virtual Visits (Telemedicine).  Patients are able to view lab/test results, encounter notes, upcoming appointments, etc.  Non-urgent messages can be sent to your provider as well.   To learn more about what you can do with MyChart, go to [email or web address].    Your next appointment:   2 month(s)  The format for your next appointment:   In Person  Provider:   Minus Breeding, MD

## 2022-10-08 ENCOUNTER — Encounter: Payer: Self-pay | Admitting: Cardiology

## 2022-10-13 MED ORDER — METOPROLOL SUCCINATE ER 25 MG PO TB24: 25.0000 mg | ORAL_TABLET | Freq: Every day | ORAL | 1 refills | Status: DC

## 2022-10-19 ENCOUNTER — Encounter: Payer: Medicare PPO | Admitting: Internal Medicine

## 2022-10-19 DIAGNOSIS — J849 Interstitial pulmonary disease, unspecified: Secondary | ICD-10-CM

## 2022-10-19 DIAGNOSIS — Z006 Encounter for examination for normal comparison and control in clinical research program: Secondary | ICD-10-CM

## 2022-10-19 NOTE — Research (Signed)
Title: Chronic Fibrosing Interstitial Lung Disease with Progressive Phenotype Prospective Outcomes (ILD-PRO) Registry    Protocol #: IPF-PRO-SUB, Clinical Trials # [id number], Sponsor: [employer]   Protocol Version Amendment 4 dated 12Sep2019  and confirmed current on  Consent Version for today's visit date of  Is [place] IRB Approved Version 27 Oct 2018 Revised 27 Oct 2018   Objectives:  Describe current approaches to diagnosis and treatment of chronic fibrosing ILDs with progressive phenotype  Describe the natural history of chronic fibrosing ILDs with progressive phenotype  Assess quality of life from self-administered participant reported questionnaires for each disease group  Describe participant interactions with the healthcare system, describe treatment practices across multiple institutions for each disease group  Collect biological samples linked to well characterized chronic fibrosing ILDs with progressive phenotype to identify disease biomarkers  Collect data and biological samples that will support future research studies.                                            Key Inclusion Criteria: Willing and able to provide informed consent  Age ? 30 years  Diagnosis of a non-IPF ILD of any duration, including, but not limited to Idiopathic Non-Specific Interstitial Pneumonia (INSIP), Unclassifiable Idiopathic Interstitial Pneumonias (IIPs), Interstitial Pneumonia with Autoimmune Features (IPAF), Autoimmune ILDs such as Rheumatoid Arthritis (RA-ILD) and Systemic Sclerosis (SSC-ILD), Chronic Hypersensitivity Pneumonitis (HP), Sarcoidosis or Exposure-related ILDs such as asbestosis.  Chronic fibrosing ILD defined by reticular abnormality with traction bronchiectasis with or without honeycombing confirmed by chest HRCT scan and/or lung biopsy.  Progressive phenotype as defined by fulfilling at least one of the criteria below of fibrotic changes (progression set point)  within the last 24 months regardless of treatment considered appropriate in individual ILDs:  decline in FVC % predicted (% pred) based on >10% relative decline  decline in FVC % pred based on ? 5 - <10% relative decline in FVC combined with worsening of respiratory symptoms as assessed by the site investigator  decline in FVC % pred based on ? 5 - <10% relative decline in FVC combined with increasing extent of fibrotic changes on chest imaging (HRCT scan) as assessed by the site investigator  decline in DLCO % pred based on ? 10% relative decline  worsening of respiratory symptoms as well as increasing extent of fibrotic changes on chest imaging (HRCT scan) as assessed by the site investigator independent of FVC change.     Key Exclusion Criteria: Malignancy, treated or untreated, other than skin or early stage prostate cancer, within the past 5 years  Currently listed for lung transplantation at the time of enrollment  Currently enrolled in a clinical trial at the time of enrollment in this registry       Clinical Research Coordinator / Research RN note : This visit for  [name] [place]  Subject [id number] with [id number] on [phone] for the above protocol is Visit/Encounter 9 and is for purpose of research.    Subject expressed continued interest and consent in continuing as a study subject. Subject confirmed that there was no change in contact information (e.g. address, telephone, email). Subject thanked for participation in research and contribution to science.     During this visit on 27Nov2023 , the subject completed the blood work and questionnaires per the above referenced protocol. Please refer to the subject's paper source binder for further details.  Signed by Walford Assistant [facility]  [place], [place] 2:09pm [id number]

## 2022-10-22 ENCOUNTER — Ambulatory Visit: Payer: Medicare PPO | Admitting: Cardiology

## 2022-10-27 ENCOUNTER — Encounter: Payer: Self-pay | Admitting: Internal Medicine

## 2022-10-27 ENCOUNTER — Ambulatory Visit: Payer: Medicare PPO | Admitting: Internal Medicine

## 2022-10-27 VITALS — BP 120/58 | HR 79 | Temp 98.1°F | Ht 60.0 in | Wt 139.2 lb

## 2022-10-27 DIAGNOSIS — J849 Interstitial pulmonary disease, unspecified: Secondary | ICD-10-CM | POA: Diagnosis not present

## 2022-10-27 DIAGNOSIS — K449 Diaphragmatic hernia without obstruction or gangrene: Secondary | ICD-10-CM

## 2022-10-27 DIAGNOSIS — R0609 Other forms of dyspnea: Secondary | ICD-10-CM | POA: Diagnosis not present

## 2022-10-27 DIAGNOSIS — R053 Chronic cough: Secondary | ICD-10-CM

## 2022-10-27 DIAGNOSIS — J679 Hypersensitivity pneumonitis due to unspecified organic dust: Secondary | ICD-10-CM

## 2022-10-27 DIAGNOSIS — J209 Acute bronchitis, unspecified: Secondary | ICD-10-CM

## 2022-10-27 LAB — D-DIMER, QUANTITATIVE: D-Dimer, Quant: 0.39 mcg/mL FEU (ref <0.50)

## 2022-10-27 MED ORDER — DOXYCYCLINE HYCLATE 100 MG PO TABS: 100.0000 mg | ORAL_TABLET | Freq: Two times a day (BID) | ORAL | 0 refills | Status: DC

## 2022-10-27 MED ORDER — PREDNISONE 10 MG PO TABS: ORAL_TABLET | ORAL | 0 refills | Status: AC

## 2022-10-27 NOTE — Progress Notes (Signed)
#GE reflux with small hiatal hernia  - on ppi   #History of rapid heart rate not otherwise specified  - Start her on Lopressor  2008/2009 by primary care physician. History appears to correlate with onset of respiratory issues  - refuses to dc this drug as of 2014 discussion   # Hypersensitivity Pneumonitis and ILD  - Potential etiologies - cockateil x 2 for 18 years through end 2012. 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 Denies mold but lives in house built in [place] with a "weird baselment: and has humidifier  - first noted on CT chest 10/15/09 following trip to [place] (PE negative) but not described in 2003 CT chest report  - autoimmune panel: 10/23/11: Negative  -  Uderwennt bronch 11/19/11  - non diagnostic  - VATS Nov 2013 (done after initially refusing)- [place]. However, there is worrying trend of UIP pattern in the Upper lobes.         OV 06/17/2017  Chief Complaint  Patient presents with   Follow-up    [name]. Pt denies significant cough, denies CP/tightness, f/c/s.     She is better. Feels she does not need o2. Has been to [facility] for transplant clinic; felt too early. Seems higher dose prednisone helping but then she is also bettter t his time of the year. In May 2018 I was at [facility] and curbsided national thought leaders - feel that we could explore silent active GERD as a possibility of ILD getting wors with time.   OV 01/11/2018  Chief Complaint  Patient presents with   Follow-up    PFT done today.  Pt states she has been coughing x2 weeks since she returned from a trip to [place]. From coughing, pt has had pain in ribs. Pt states she has mild SOB which is stable due to rehab.  Pt states that Dr. Darron Doom is wanting to know if pt could possibly have eosinophilic esophagitis.   Went to [place]. Returned and has been  coughing x 2 weeks. Associated with this is some rib pain on right side. She feels she does not need o2. She feels she does not need pred/abx at this time. She also exposed to flu wit family   OV 02/08/2018  Chief Complaint  Patient presents with   Acute Visit    CXR done 02/07/18.  Pt states her throat is sore but not as bad as it was yesterday when in office and does have some left ear pain. Pt states she has some pain on right rib cage and has increased fatigue.     Rhonda [name] presents acutely.  I last saw her one month ago gave her Tamiflu for prophylaxis after flu exposure.  At that point in time she was already reducing her chronic stable prednisone of 10 mg for interstitial lung disease/hypersensitivity pneumonitis.  She had tapered herself slowly 3 or 4 weeks ago to 5 mg.  She says she was doing fairly okay but in the last 2 or 3 weeks she has had increased fatigue.  She says she goes daily swimming and then feels energized but then an hour later starts having fatigue which is more than usual.  Also in the daytime she has random fatigue.  In terms of her respiratory symptoms these are stable and in fact slightly better in terms of shortness of breath  and cough but she is having sore throat without any fever and some left-sided otalgia.  In addition she is having right-sided chest pains that are more than usual.  The chest pains have been reported before.  These are specifically at localized spots along the previous incision several years ago for interstitial lung disease.  They are tender as well to touch.  This pain is worse.  In addition she is also being bothered by arthralgia in her hand and left hip.  The left hip arthralgias old with a hand arthralgias new.  She is worried about pneumothorax recurrence and so she had a chest x-ray yesterday that shows no pneumothorax.  She has stable interstitial lung disease changes but in my personal visualization these interstitial lung disease changes  are worse compared to several years ago.  We do know that she has slowly worsening interstitial lung disease.  In terms of a walking desaturation test this shows stability since last visit.  She has seen GI for acid reflux and has pH probe study coming up  In talking to her she tells me she has been on metformin for over a month or 2.  This is new and is meant for pre-diabetes.  Apparently her hemoglobin A1c is always below 6 but is above 5.5.  Walking desaturation test on 02/08/2018 185 feet x 3 laps on ROOM AIR:  did walk normal pace with forehead probe desaturate. Rest pulse ox was 99%, final pulse ox was 93%. HR response 72/min at rest to 93/min at peak exertion. [name]  Did not Desaturate < 88% . Rhonda [name] yes did  Desaturated </= 3% points. Rhonda [name] yes did get tachyardic   Dg Chest 2 View  Result Date: 02/07/2018 CLINICAL DATA:  Chest wall pain for 2 weeks EXAM: CHEST - 2 VIEW COMPARISON:  CT 08/20/2017, radiograph 10/11/2015 FINDINGS: Coarse interstitial pattern consistent with pulmonary fibrosis, similar distribution compared to prior. No focal pulmonary opacity or pleural effusion. Stable cardiomediastinal silhouette with aortic atherosclerosis. No pneumothorax. Degenerative changes of the spine. IMPRESSION: No active cardiopulmonary disease. Similar appearance of diffuse pulmonary fibrosis. Negative for a pneumothorax. Electronically Signed   By: Donavan Foil M.D.   On: 02/07/2018 14:26    OV 04/13/2018  Chief Complaint  Patient presents with   Follow-up    Pt had pre spiro and DLCO PFT prior to OV. Pt has increase of productive cough-thick white in last week. Pt has some wheezing, and allergy issues.   Rhonda [name] presents for routine follow-up.  I saw her acutely just approximately 2 months ago.  Then end of April 2019 she saw a nurse practitioner again acutely and was given antibiotic and prednisone.  She says she felt better after that but in the  last week or so due to increased pollen load in the community and also moving furniture because her daughter is relocating out of her home she started having more cough.  There is no change in her dyspnea with the cough is really bad.  This is despite Singulair and Chlor-Trimeton.  Those things to help but just take the edge off.  She feels that the pollen making the cough worse.  There is no change in dyspnea.  Walking desaturation test documented below his baseline.  She had pulmonary function test today but the [facility] shows significant decline compared to February 2019 and she feels surprised by this.  DLCO is baseline unchanged and a walking desaturation test limited  in the office is also unchanged.  She does not have any fever but has significant postnasal drip.  She has not followed up at [facility] transplant clinic or the allergist recently.  OV 08/16/2018  Subjective:  Patient ID: Rhonda [name], female , DOB: February 10, 1951 , age 74 y.o. , MRN: [id number] , ADDRESS: [address] [address]   08/16/2018 -   Chief Complaint  Patient presents with   Follow-up    PFT performed today.  Pt states she has had a lot of mucus the month of September 2019 so she has been taking the Chlor-Trimeton. States other than that she has been able to do a lot more activities and has been swimming a lot more. Pt states she believes the SOB is stable.     HPI QUINLYN TEP 71 y.o. -continues to do well.  She is taking higher dose of prednisone 10 mg/day.  Also the summer usually she is doing well.  She is not having much of a cough.  Lung function test shows improvement compared to tests done earlier this year.  In fact she is almost as good as much 2018.  Still compared to 2014-2016 she has progressive lung disease.  Walking desaturation test is stable.  She needs a high-dose flu shot but we do not have stock today.  She has lost some weight intentionally with better dietary control.  She is  exercising regularly swimming.  We discussed ofev  potential future therapy if the drug is approved for this indication.  We will know study results in a year or so.  She is open to the idea but is worried somewhat about her irritable bowel syndrome flaring up.  She is no longer following at the [facility] lung transplant clinic given stability lung function  Other issues: She always has right-sided chest wall pain to the site of lung biopsy.  The only way this is been resolved as by her limiting her use of bra .  Also her irritable bowel syndrome is under control but in case it flares up she wants a refill of dicyclomine     OV 12/15/2018  Subjective:  Patient ID: Rhonda [name], female , DOB: [birth year] , age 42 y.o. , MRN: [id number] , AD[address]ESS: [address] [place] [address]   12/15/2018 -   Chief Complaint  Patient presents with   Follow-up    Pt states she has been doing better since last visit with TP but states she has been having pain in her rib cage x2 years but states it has become more intense.     HPI Rhonda [name] 71 y.o. -returns for follow-up of her ILD due to hypersensitivity pneumonitis.  She did a clinic visit also for research with the ILD-pro registry study.  Since I last saw her in September 2019 she had a flareup and required higher doses of prednisone.  Since then she is returned to baseline of 5 mg prednisone and she feels good.  She is stable.  Walking desaturation test shows stability.  We have been discussing over the last few months about taking nintedanib based on new data and progressive non--IPF ILD's.  She has trepidation for this because of irritable bowel syndrome.  After much discussion she is willing to try 100 mg once daily for a month and then escalate to 100 mg twice daily which is the therapeutic dose.  She will do this once after insurance approval which we suspect  will happen over the next few months.  Her main issue is that her right  sided infra-axillary chest pain is getting worse.  This started after her surgical lung biopsy and pneumothorax on the right side.  It is random and happens with twisting.  But now days it happens once every few days.  Previously it used to happen once every few weeks.  Sometimes it is excruciating but then she is left in pain for a few days.  Applying Abrol sudden twisting of her body makes it worse.  Is a lancinating pain.  She did try gabapentin some years ago for chronic cough .  She did tolerate the gabapentin well.  She does not remember if it helped the pain or not but clearly back and the pain was much less intense.  She is willing to try this again.  I recommended a CT scan of the chest but she is going to have radiation for a CT coronary angiogram because of high levels of coronary artery calcification.  Therefore written to the radiologist to see if he can look at the lung parenchyma with a CT angiogram    OV 05/09/2019  Subjective:  Patient ID: Rhonda [name], female , DOB: 1951/01/30 , age 43 y.o. , MRN: [id number] , AD[address]ESS: [address] [place] [address]   05/09/2019 -   Chief Complaint  Patient presents with   ILD (Interstitial Lung Disease)     HPI Rhonda [name] 71 y.o. -follow-up for chronic care physician pneumonitis on daily 5 mg prednisone.  She has problems with right-sided chest wall pain that is neuropathic.  She tells me since her last visit January 2020 she has been isolating and social distancing because of the pandemic.  Overall she is been stable.  Her symptom scores are mild and documented below.  She continues with prednisone.  She was supposed to have pulmonary function test but this was held because of the pandemic.  Her walking desaturation test shows she is stable.  She is supposed to have pulmonary function test tomorrow but wanted to see me today.  In the interim she did have a cardiac CT scan of the chest that shows 94th percentile of coronary calcium.   She could not get a CT angiogram done because of PVC.  A nuclear medicine stress test was done and I reviewed this result and it was normal.  A Holter test is pending.  She is kind of nervous because of the ongoing COVID-19 situation and safety of doing a Holter test.  In terms of her right chest wall pain she is adapted to it.  She does not use any bra  OV 08/22/2019  Subjective:  Patient ID: Rhonda [name], female , DOB: 07-14-1951 , age 22 y.o. , MRN: [id number] , AD[address]ESS: [address] [address]   08/22/2019 -   Chief Complaint  Patient presents with   Follow-up    needs sx clearance for L hip replacement- not yet scheduled, pending clearance.     Patient lung disease chronic hypersensitive pneumonitis on chronic daily prednisone 5 mg/day  HPI Rhonda [name] 71 y.o. -presents for follow-up of her interstitial lung disease due to chronic hypersensitive pneumonitis.  After last visit in June 2020 given her PFT stability and also her aversion to the potential side effects with nintedanib she decided to just continue with prednisone daily 5 mg/day.  Given the pandemic she is walking 1 mile a day without stopping.  Her interstitial lung disease symptom score is actually slightly better.  Overall she reports stability in her interstitial lung disease.  She has a new issue of getting preoperative clearance for her left hip.  She says the left hip is bone-on-bone and she does not want to take Aleve.  She prefers to have surgery.  Dr. Paralee Cancel is the surgeon.  Surgery date has not been set.  She is very functional.  She has normal renal function.  Normal nutritional status.  No anemia.  No respiratory infections in the last 1 month.  She is not on oxygen.  Age 22.  The surgeries in the left hip and the duration of surgery is under 3 hours.  Anesthesia will be general.      OV 01/30/2020  Subjective:  Patient ID: Rhonda [name], female , DOB: 1951-06-18 , age 3 y.o. , MRN:  [id number] , AD[address]ESS: [address] [address] Patient lung disease chronic hypersensitive pneumonitis on chronic daily prednisone 5 mg/day  01/30/2020 -   Chief Complaint  Patient presents with   Follow-up    PFT performed 3/8.  Pt states she believes she has been going downhill for the past 2 months. Pt was started on O2 at last OV and wears it prn. Pt does have an occ cough.     HPI Rhonda [name] 71 y.o. -presents for follow-up of interstitial lung disease secondary to chronic hypersensitive pneumonitis.  She maintains herself on prednisone 5 mg/day.  In the interim she has had hip surgery and this did wonders for her.  She is able to walk longer distances.  However she does notice that her dyspnea has declined.  She says since January 2021 his symptoms have declined particularly with cough.  Usually in the winter she goes through a cycle of severe cough and exacerbation that requires higher levels of prednisone.  She has been social distancing well and masking well.  She states there is absolutely no mold or water any antigen exposure at home.  But she feels it is her allergies acting up and making her more symptomatic.  She has had a Covid vaccine and is wondering about either going back to swimming or starting pulmonary rehabilitation.  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  In fact when we walked her today with the mask she did drop down to 89%.  This is a decline for her.  She had pulmonary function test and this shows significant decline in FVC and DLCO.  In review of this that.  Where she has declined this much but is always bounce back with steroids.  At last visit we discussed nintedanib for her as a way to prevent progression of her ILD but given her concerns of GI side effects and the presence of irritable bowel syndrome she is declined.  Infective have this conversation with her  including her joining the [employer] at [facility] but she has always been worried about the GI side effects with nintedanib.  This time she is more open to it because of the decline in lung function.  She wants to see an allergist  She also told me that she continues to have a restrictive chest pain particularly on the right side lower area.  She no longer wears a brassiere it has been nearly 3 years since she had a high-resolution CT chest.  She  is open to having one now.  Last liver function test was normal in January 2020.  Last renal function was normal in November 2020.      OV 03/19/2020  Subjective:  Patient ID: Rhonda [name], female , DOB: 1951-01-06 , age 34 y.o. , MRN: [id number] , AD[address]ESS: [address] [place] [address]  Chronic hypersensitive pneumonitis interstitial lung disease on prednisone daily 10 mg 03/19/2020 -   Chief Complaint  Patient presents with   Follow-up    Pt states she is doing better since last visit and states her breathing has also improved.     HPI Rhonda [name] 71 y.o. -at last visit in March 2021 there was decline in lung function.  Therefore we started nintedanib.  She took 1 tablet of 150 mg and then had immediate abdominal pain and fever.  Therefore she is decided against taking this.  I also support this decision because I do not think she will tolerate nintedanib.  We did extensive interstitial lung disease question a history again at that time.  She had taken 1 dose of nitrofurantoin.  Have indicated to her that she should never take this medicine again.  In addition discovered that she might have some feather jackets at home she has now gotten rid of it.  She is now doing pulmonary rehabilitation.  Her overnight oxygen desaturation test is negative.  At pulmonary rehabilitation she tells me she does not drop below 91% and 1 time she went to 89%.  Otherwise overall doing great.  Her cardiologist is increased her Lopressor.  Her daughter is getting  married and she is busy with the wedding planning this is being emotionally stressful for her.  Her allergist has increased her recommendations and this is also helping her.  Her allergist is retiring and she wanted recommendations for new allergist.  I have recommended Dr. Fredderick Phenix and Dr. Remus Blake.  Incidentally that right-sided chest pain that she had is also better with increased prednisone of 10 mg/day.  She feels prednisone is working really well for her.    OV 08/29/2020   Subjective:  Patient ID: Rhonda [name], female , DOB: 1951/06/05, age 25 y.o. years. , MRN: [id number],  ADDRESS: [address] [address] PCP  Hayden Rasmussen, MD Providers : Treatment Team:  Attending Provider: Brand Males, MD Patient Care Team: Hayden Rasmussen, MD as PCP - General (Family Medicine) Minus Breeding, MD as PCP - Cardiology (Cardiology) Brand Males, MD as Consulting Physician (Pulmonary Disease) Verl Blalock, Marijo Conception, MD (Inactive) (Cardiology) Kennith Center, RD as Dietitian (Family Medicine) Minus Breeding, MD as Consulting Physician (Cardiology)  Follow-up chronic hypersensitive pneumonitis on chronic prednisone 10 mg/day.  Last high-resolution CT chest March 2021.  Intolerant to nintedanib.  Chief Complaint  Patient presents with   Follow-up    Pt states she had been doing well since last visit up until 3-4 weeks ago and started having problems with her breathing and has had to use her O2 with activities. Pt also has been coughing a lot and is getting up clear phlegm. Pt also has had some mild wheeze.       HPI Rhonda [name] 71 y.o. -returns for follow-up.  Since I last saw her her daughter is now married.  She had a great wedding.  She was able to enjoy this and dance quite well.  She barely used her oxygen her effort tolerance was good.  Some 3 weeks [place]  Apparently she had difficulty getting her oxygen tank  to clinic to the electrical port.  She noticed with exertion a pulse ox dropping to the 70s.  She said even before she left a few days prior to that she started having increasing cough.  She notices that these cough cycles get exacerbated in the fall in the winter.  Looking back at her pulmonary function test there is always a dip between October and March each year.  Between 2015 and 2017 there was a decline and since then there is a waxing and waning quality with dips in the winter in the fall season.  Each time she responds to prednisone.  Most recent pulmonary function test in August 2021 is actually improved compared to March 2021.  However today she is feeling worse.  She feels something is in her airway.  She feels dry air in the house and the animals bringing leaves from outside is contributing.  She says she feels better upstairs than downstairs where the dogs.  She again denies any mold or mildew.  Denies any feather jacket exposure.  She feels she will benefit from another round of prednisone.  She feels in the past antibiotics have not helped her as much as prednisone.   In terms of chronic ILD HP: She not tolerate nintedanib in the past.  I explained to her that I am concerned about progression and her decline in functional quality and the risk for that.  We discussed pirfenidone as an alternative.  Discussed the fact is less well studied although have no reason to see why it would not be effective.  Explained the side effect profile.  She is willing to try this.  We decided to go donor samples from the patient support group  Oxygen qualification: We walked her and she qualified for oxygen today.  Is for portable oxygen with [employer]  Other issue: Spring allergies: -Her allergist is retired.  We discussed options of referral.  Referred her to Dr. Hardie Pulley referred to     OV 01/14/2021  Subjective:  Patient ID: Rhonda [name], female , DOB: 03/16/1951 , age 66 y.o. , MRN: [id number] ,  ADDRESS: [address] [address] PCP Hayden Rasmussen, MD Patient Care Team: Hayden Rasmussen, MD as PCP - General (Family Medicine) Minus Breeding, MD as PCP - Cardiology (Cardiology) Brand Males, MD as Consulting Physician (Pulmonary Disease) Verl Blalock, Marijo Conception, MD (Inactive) (Cardiology) Kennith Center, RD as Dietitian (Family Medicine) Minus Breeding, MD as Consulting Physician (Cardiology)  This Provider for this visit: Treatment Team:  Attending Provider: Brand Males, MD    01/14/2021 -   Chief Complaint  Patient presents with   Follow-up    Nauseous, loss of appetite    Follow-up chronic hypersensitive pneumonitis on prednisone 5 mg/day and also pirfenidone submaximal dose of 2 pills 3 times daily since October 2021.  On ILD-pro registry protocol  - failed ofev   - failed esbrit due to side effects Faythe Ghee 2022  She is also on Breo  HPI Rhonda [name] 71 y.o. -returns for follow-up.  She is now on pirfenidone along with low-dose prednisone.  She feels the pirfenidone is not reacting well with her at all.  She has intermittent loss of taste at least twice a week.  She has loss of appetite and she has forces herself to eat.  She also has fatigue.  She says that  if I strongly recommended then she will continue to soldier on with it.  Otherwise she feels less short of breath.  Pulmonary function test reviewed and is actually stable/slightly improved.  She says the cough is extremely well controlled except today after the pulmonary function test she was coughing quite a bit.  She is looking forward to going back to swimming.  She has had a Covid vaccine in booster and also status post EVUSHELD monoclonal antibody prophylaxis.  We discussed the possibility about possibility of enrolling in inhaled nitric oxide device/administration study.  This is to see if people can improve in the shortness of breath scale and also physical activity.  She took the consent form and  is deliberating.  She thinks she might find the study and inconvenience to her lifestyle where she is wanting to be more active.  We went over several details of this research protocol  She has a new symptom of like itching sensation deep in the right flank.  She feels it is not the skin it is from deep inside.  She is wondering if it could be related to pirfenidone.  Started after going on pirfenidone.  Explained to her that I am not seeing this in other patients with pirfenidone but will have to be open minded.  She does not apply Vaseline to her skin in the winter.  We discussed the possibility this could be dry skin related but she does not have other features of dry skin.  Nevertheless she is willing to try Vaseline.   Results for CHAPP   OV 07/08/2021  Subjective:  Patient ID: Rhonda [name], female , DOB: [birth year] , age 82 y.o. , MRN: [id number] , ADDRESS: [address] [address] [place] [address] PCP Hayden Rasmussen, MD Patient Care Team: Hayden Rasmussen, MD as PCP - General (Family Medicine) Minus Breeding, MD as PCP - Cardiology (Cardiology) Brand Males, MD as Consulting Physician (Pulmonary Disease) Verl Blalock, Marijo Conception, MD (Inactive) (Cardiology) Kennith Center, RD as Dietitian (Family Medicine) Minus Breeding, MD as Consulting Physician (Cardiology)  This Provider for this visit: Treatment Team:  Attending Provider: Brand Males, MD    07/08/2021 -  ACUTE VISIT Chief Complaint  Patient presents with   Acute Visit    Pt states that she did start taking zpak yesterday 8/15 and said her cough is doing some better after being on second day of abx and said that she started prednisone today 8/16. States that she still has increased SOB. Pt did take a covid test which came back negative.   Follow-up chronic hypersensitive pneumonitis on prednisone 5 mg/day and also pirfenidone submaximal dose of 2 pills 3 times daily since October 2021.  On ILD-pro registry protocol -  failed ofev   - failed esbrit due to side effects marh 2022  She is also on Breo   HPI Rhonda CARRANZA 71 y.o. -this is an acute visit.  Last seen in February 2022 after that she was supposed to see me back in 3 months.  But this visit has been scheduled acutely.  She tells me that early in May 2022 she went to [facility].  They rented a house.  She believes that the bedroom had some mold.  She did not visually confirmed the mold but husband might have.  She says shortly after going there and spending a week she started having worsening cough with hoarseness of voice.  This cough is persisted all along and the hoarseness has persisted  as well.  The in June 2022 she had a short course of steroids that did not help.  She believes the dosing was not strong enough.  In July 2022 she had some back issues and was given 30 mg of prednisone for approximately 10 days with a taper and this helped her back.  But all along the cough is persisted and severe hoarseness is persisted.  Then approximately a week ago she started getting shortness of breath along with yellow sputum.  The cough got worse .  She states even at baseline she brings her a lot of white sputum and has to cough a lot.  [place] and a couple of days into the illness.  At this point in time she started noticing she was more easily desaturating.  She is not able to swim.  She says walking to the car her pulse ox dropped to 84%.  Then the yellow phlegm started getting worse.  She called in yesterday and we prescribed Z-Pak and a prednisone burst.  She says now with the Z-Pak the sputum color is improving.  A week ago she also saw Dr. Blenda Nicely for chronic cough.  100 mg 3 times daily of gabapentin has been prescribed.  She is slowly increasing it.  She is worried about the side effects of this drug.  Apparently her vocal cord had ulcers from repeated coughing.  ILD symptom score shows worsening.  In the office  she is very hoarse   Of note she stopped her pirfenidone in February/March 2022 because of side effects - gerd  She uses oxygen with exertion.  There is no leg swelling or hemoptysis.  Started z pak yesterday Started 12d pred taper yesterday     PFT   OV 08/14/2021  Subjective:  Patient ID: Rhonda [name], female , DOB: [birth year] , age 22 y.o. , MRN: [id number] , ADDRESS: [address] [place] [address] PCP Hayden Rasmussen, MD Patient Care Team: Hayden Rasmussen, MD as PCP - General (Family Medicine) Minus Breeding, MD as PCP - Cardiology (Cardiology) Brand Males, MD as Consulting Physician (Pulmonary Disease) Verl Blalock, Marijo Conception, MD (Inactive) (Cardiology) Kennith Center, RD as Dietitian (Family Medicine) Minus Breeding, MD as Consulting Physician (Cardiology)  This Provider for this visit: Treatment Team:  Attending Provider: Brand Males, MD  Follow-up chronic hypersensitive pneumonitis on prednisone 5 mg/day and also pirfenidone submaximal dose of 2 pills 3 times daily since October 2021.  On ILD-pro registry protocol - failed ofev   - failed esbrit due to side effects Faythe Ghee 2022  She is also on Breo  08/14/2021 -   Chief Complaint  Patient presents with   Follow-up    Pt states she is feeling better since last visit. States she did receive her lighterweight POC which has been working well for her.     HPI TEAH VOTAW 71 y.o. -seen last month with worsening symptoms acute bronchitis/flare.  Given Z-Pak and prednisone.  She is significantly better.  She tells me that even though she is better and his subjective symptom score is better below.  She still feels not back to her May 2022 baseline.  She says in the neighborhood she has to use more oxygen.  She is interested in a backpack for oxygen.  She is not able to swim as well as she used to.  She says acid reflux is under better control now after seeing Dr.  Marcellino in ENT.  She is wondering  about starting pirfenidone she definitely does not want to do nintedanib again.  But even with pirfenidone she is reluctant.  We discussed CellCept because of progression but because of the immunosuppression she is reluctant.  We resolved that she would increase the prednisone to 10 mg/day and reassess.  She was supposed to have done a spirometry today but it did not happen.  She will have it done again in 4 weeks.  Have requested a schedule for that.    The main concern right now even though is better is that she is not to her baseline as of a year ago and she feels like she is having slowly progressive disease interstitial lung disease/chronic HP.       OV 10/03/2021  Subjective:  Patient ID: Rhonda [name], female , DOB: Apr 10, 1951 , age 14 y.o. , MRN: [id number] , AD[address]ESS: [address] [id number] PCP Hayden Rasmussen, MD Patient Care Team: Hayden Rasmussen, MD as PCP - General (Family Medicine) Minus Breeding, MD as PCP - Cardiology (Cardiology) Brand Males, MD as Consulting Physician (Pulmonary Disease) Verl Blalock, Marijo Conception, MD (Inactive) (Cardiology) Kennith Center, RD as Dietitian (Family Medicine) Minus Breeding, MD as Consulting Physician (Cardiology)  This Provider for this visit: Treatment Team:  Attending Provider: Brand Males, MD    10/03/2021 -   Chief Complaint  Patient presents with   Follow-up    Pt states her breathing has become worse since last visit. States she is having to use her O2 almost all the time now with exertion.    Follow-up chronic hypersensitive pneumonitis on prednisone 5 mg/day and also pirfenidone submaximal dose of 2 pills 3 times daily since October 2021.  On ILD-pro registry protocol - failed ofev due to side efect  - failed esbrit due to sde effects marvh 2022  -Last echo 2018  -Last high-res CT March 2021. -> Nov 2022 stable between 2  She is also on Breo  HPI KATHRINE RIEVES 70 y.o. -returns for  follow-up.  In this visit she is categorically saying that she is significantly worse.  This point in particular relationship to dyspnea on exertion.  Her weight itself is stable.  She is currently on 5 mg prednisone per day [last time I thought I increased it to 10 mg/day].  Nevertheless she says other interim changes is that she has had slow weight gain.  BMI is 30.  She started semaglutide for hemoglobin A 1.C 6.5.  Is also to help weight.  She was also after seeing Dr. Blenda Nicely for her cough and significant amount of anti-PPI therapy.  She said this made her diarrhea worse.  She then reduced it and currently is taking omeprazole 20 mg/day in the daytime and 40 mg at night and in between Pepcid as well.  She stopped the gabapentin.  She feels after reducing the PPI the diarrhea is improved.  The cough is not as bad as it was even after stopping gabapentin but she continues to be fatigued.  She complains of bloating.  She feels her stomach is full.  She feels acid reflux is worse.  Acid reflux is worse after reducing the dose of H2 blockade.  She also has early satiety.  She also states when she coughs she vomits.  She feels she needs to see Dr. Ethlyn Gallery.  I have messaged him and he hasagreed for him or his PA to see her soon.  Nevertheless symptoms are significantly worse as seen on the symptom score below.  She is also reporting increased tachycardia with exertion and apparently Dr. Percival Spanish adjusted her beta-blocker.  She has upcoming appointment with them.    She says that at times when she desaturates easily at home even for minimal exertion.  We walked her today with a forehead probe and she actually did much better than expected going down to 89% only at the end of 3 labs [this is actually an improvement from the recent past versus stability].  She might have an erratic finger pulse ox probe at home.   Her pulmonary function shows a significant decline in DLCO.  Last echocardiogram 2018.  Last  high-resolution CT chest March 2021.  Never had right heart catheterization.  Hemoglobin normal 14.7 g% in August 2022.           OV 10/21/2021  Subjective:  Patient ID: Rhonda [name], female , DOB: June 29, 1951 , age 57 y.o. , MRN: [id number] , ADDRESS: [address] [id number] PCP Hayden Rasmussen, MD Patient Care Team: Hayden Rasmussen, MD as PCP - General (Family Medicine) Minus Breeding, MD as PCP - Cardiology (Cardiology) Brand Males, MD as Consulting Physician (Pulmonary Disease) Verl Blalock, Marijo Conception, MD (Inactive) (Cardiology) Kennith Center, RD as Dietitian (Family Medicine) Minus Breeding, MD as Consulting Physician (Cardiology)  This Provider for this visit: Treatment Team:  Attending Provider: Brand Males, MD  Type of visit: Video Circumstance: COVID-19 national emergency Identification of patient SAKIYA STEPKA with 10/29/1951 and MRN [id number] - 2 person identifier Risks: Risks, benefits, limitations of telephone visit explained. Patient understood and verbalized agreement to proceed Anyone else on call: no Patient location:  her home This provider location: [address]; [place]; [place] [address]. [facility]. [phone]    10/21/2021 -     Follow-up chronic hypersensitive pneumonitis on prednisone 5 mg/day and also pirfenidone submaximal dose of 2 pills 3 times daily since October 2021.  On ILD-pro registry protocol - failed ofev due to side efect  - failed esbrit due to sde effects marvh 2022  -Last echo 2018  -Last high-res CT March 2021.  She is also on Breo HPI JAXSON KEENER 71 y.o. -returns for follow-up via video visit to discuss test results.  She says currently she is just doing PPI lower dose 20 mg omeprazole in the daytime and famotidine at night.  With this her diarrhea is better.  Also bloating is better.  Correlating with this for she feels her lungs are stable.  She does say  that even now when she bends down she can desaturate.  She is not using oxygen at night.  Her primary care is testing her over no at night to see if she really needs it at night.  Nevertheless she feels somewhat better.  She had echocardiogram because of concern of pulmonary hypertension and there is no evidence of pulmonary hypertension.  She had high-resolution CT chest which the radiologist was described as minimally progressive since 18 months earlier in March 2021.  She had pulmonary function test that shows the FVC to be stable in the last few to several months but the DLCO to show slight decline.  Nevertheless the pattern at least compared to 2 years ago is 1 of progressive ILD on the pulmonary function test.  Certainly the hypoxia with easy exertion compared to a few years ago or a year ago is also  a sign that her ILD is getting worse.  She is likely stable or stabilized in the last few to several months.  We discussed going challenge again with nintedanib or pirfenidone but she is not interested.  We discussed doing CellCept and discussed the side effect profile of this.  She is not interested.  We discussed transplant referral for evaluation now that she is progressing so she can get plugged in and have a good conversation.  She is reflected on this from the last visit and she is not interested  We discussed the acid reflux controlled she feels she is at a good place right now with good acid reflux controlled with PPI in the morning and H2 blockade at night.  She has seen GI and at this point I believe they are not going to do pH probe.  I reviewed the note.  She continues on Breo and prednisone 5 mg/day which she plans to continue.  She has heard about the promedior trial with IV Pnetraxin for IPF.  This might be a study for non-- IPF progressive phenotype.  She prefers to do the study because it bypasses the GI route and it is an IV study.  We do not know if the study is going to happen.  We do  not know if you are going to be a site.  If these things all get aligned then maybe we could consider that.        OV 07/21/2022  Subjective:  Patient ID: Rhonda [name], female , DOB: 1951/04/21 , age 61 y.o. , MRN: [id number] , AD[address]ESS: [address] [id number] PCP Hayden Rasmussen, MD Patient Care Team: Hayden Rasmussen, MD as PCP - General (Family Medicine) Minus Breeding, MD as PCP - Cardiology (Cardiology) Brand Males, MD as Consulting Physician (Pulmonary Disease) Verl Blalock, Marijo Conception, MD (Inactive) (Cardiology) Kennith Center, RD as Dietitian (Family Medicine) Minus Breeding, MD as Consulting Physician (Cardiology)  This Provider for this visit: Treatment Team:  Attending Provider: Brand Males, MD  She is also on [facility]  07/21/2022 -   Chief Complaint  Patient presents with   Follow-up    PFT performed today.  Pt states she has been doing good since last visit.     HPI CHABELI BARSAMIAN 71 y.o. -returns for follow-up.  Overall in the summer months she is doing well.  She went to the [place] in [place].  In the areas where [place] was [place] and clean air she was able to walk down the beach 200 steps and including climb up with the oxygen without desaturation and feeling good.  But when she got to [facility] where there was pollution she started having cough.  She currently feels really good.  She feels well symptom scores are improved.  Walking desaturation test is also improved.  She attributes this to some weight loss and overall healthy living.  She is on Ozempic that is helping her lose weight.  She is swimming.  She does have some cough recently for the last week because of allergies.  Her dog Ival Bible passed away.  Therefore she is taking care of the other dog and this is increasing her allergies and cough but still overall symptoms are improved.  She continues on prednisone 5 mg/day.  Currently she not  interested in any prednisone burst.  Social: She is expecting to be a grandmother in a few months.  OV 10/27/2022  Subjective:  Patient ID: Rhonda [name], female , DOB: 05/09/1951 , age 6 y.o. , MRN: [id number] , AD[address]ESS: [address] [id number] PCP Hayden Rasmussen, MD Patient Care Team: Hayden Rasmussen, MD as PCP - General (Family Medicine) Minus Breeding, MD as PCP - Cardiology (Cardiology) Brand Males, MD as Consulting Physician (Pulmonary Disease) Verl Blalock, Marijo Conception, MD (Inactive) (Cardiology) Kennith Center, RD as Dietitian (Family Medicine) Minus Breeding, MD as Consulting Physician (Cardiology)  This Provider for this visit: Treatment Team:  Attending Provider: Brand Males, MD    10/27/2022 -   Chief Complaint  Patient presents with   Acute Visit    Pt has been having complaints of increased SOB and feels like she is having a flare up. Pt states she took zpak and prednisone about a month ago. States she has had some desats in oxygen levels.     Follow-up chronic hypersensitive pneumonitis on prednisone 5 mg/day and also pirfenidone submaximal dose of 2 pills 3 times daily since October 2021.  On ILD-pro registry protocol - failed ofev due to side efect  - failed esbrit due to sde effects  March 2022  -Last echo 2018  -Last high-res CT Nov 222  0- declind lung tx referral 2023   HPI LEEANNE BUTTERS 71 y.o. -presents for follow-up.  This is an acute visit.  She is now a grandmother.  The granddaughter's name is Drue Stager.  She is pretty excited about that.  However she is having deterioration in her symptoms.  She says when she saw me in August 2023 she was actually quite well but she thought looking back she might of started with a cough.  She did travel [place] but then starting September 2023 she started having worsening cough which typically happens in the fall with the leaves get dry.  I prescribed 8-10-day prednisone burst along with  Z-Pak.  She states the prednisone always helps her.  However she feels that the prednisone I prescribed was not probably strong enough.  Since then she is having a deterioration in the cough.  She also feels more fatigued.  There is white sputum coming with this.  She is also having worsening shortness of breath for the last 2 or 3 weeks.  She feels this time the shortness of breath is different and is the more dominant symptoms.  Usually cough is the more dominant symptoms.  The deterioration in shortness of breath really suddenly in the last few weeks.  There are some associated exertional chest pain.  She did see Dr. Percival Spanish recently.  I reviewed his note.  She has had a previous normal stress test so he felt reassured this was not cardiac.  He did contemplate stopping the metoprolol given her "reactive airway disease" [she has hypersensitivity pneumonitis and can occasionally wheeze but I did leave her on metoprolol being a better once because of the beneficial effects on her heart rate with exertion].  Her symptom score showed deterioration  Her walking desaturation test also shows significant deterioration.  Similar to September 2022 but a lot worse.  This the worst its ever been.  There is no fever    SYMPTOM SCALE - ILD Sept 2020 01/30/2020  08/29/2020  01/14/2021 146# 07/08/2021 147# - off esbriet. Sic past week 08/14/2021 149# - pred only. No antifibrotic 10/03/2021 148# - pred 69m 07/21/2022 137# 10/27/2022   O2 use  o2 with ex o2 withe ex 2L  2LL o2 with ex 2L with ex 2 laps on RA  Shortness of Breath  0 -> 5 scale with 5 being worst (score 6 If unable to do)         At rest 0 0 1 0 1 0 1 0 1  Simple tasks - showers, clothes change, eating, shaving 01 1 1 1._0 Household (dishes, doing bed, laundry) 0 _1 3._2 Shopping _3 Walking level at own pace _4 Walking up Stairs _5 Total (30-36) Dyspnea Score _6 10._7 14._8 How bad is your cough? _9 How bad is your fatigue _10 0 _11 How bad is nausea  0 0 3 0 0 1 0 0  How bad is vomiting?   0 0 0 0 0 1 0 0  How bad is diarrhea?  00 1 0 0 0 1 0 0  How bad is anxiety?  0 0 0 00 0 2 0 00  How bad is depression  0 _12 0 1 0 0  00 0 0 Simple office walk 185 feet x  3 laps goal with forehead probe 04/13/2018  08/16/2018  12/15/2018  05/09/2019  08/22/2019  01/30/2020  08/29/2020  01/14/2021  07/08/2021  08/14/2021  10/03/2021  07/21/2022  10/27/2022   O2 used _13  Room air _14  ra ra  Number laps completed _15 stopped at 2 die to hip pain 3 laps - no hip pain following hip surgery _16 attempted byt did  only 2 3 bu stopped at 2 3 and did all 3 3 Ddi onl 2 las  Comments about pace good Moderate pace Normal, hip bothering     avg pace avg  avg avg avg  Resting Pulse Ox/HR 98% and 73/min 98% and HR 77/min 99% and HR 61/min 98% and HR 70/min 98% 98% and 75/min 97% and 67/mi 94% and 80/min 96% and HR 72 98% and 71 100% and HR 82 98% ad HR 72 100% and HR 79  Final Pulse Ox/HR 91% and 91/min 93% and 92.min 94% and 92/min 93% and 98/min 91%  89% and 93/,imn 88% and 94 89% ad 88.nin 88% at 2nd  lap end HR 92 86% and 96 89% and HR 91 93% and HR 87 84% and HR 104  Desaturated </= 88% no no no no   yes almost yes yes almst no yes  Desaturated <= 3% points yes yes Yes, 5 points Yes, 5 points    Yes, 5 points Yes, 8 points Yes, 12 pints Yes 11 pot 5 pots 16 pont  Got Tachycardic >/= 90/min yes yes yes yes     yes yes yes no   Symptoms at end of test none none Hip pain and very mild dyspnea  Stopped due to hip pain Moderate duyspnea with mask Mild to moderate dyspnea  Severe dyspnea Mild dyspnea Severe dyspnea Very mild dyspneax moderate  Miscellaneous comments x x    No hip pain    Similar to lastime Symptoms out of proportion ? better worse  PFT     Latest Ref Rng & Units  07/21/2022    9:50 AM 01/06/2022    9:51 AM 10/03/2021    8:59 AM 01/14/2021    1:51 PM 11/06/2020   11:03 AM 07/18/2020   10:53 AM 01/29/2020   12:53 PM  PFT Results  FVC-Pre L 1.54  1.58  1.48  1.49  1.41  1.47  1.39   FVC-Predicted Pre % 67  67  63  62  59  62  57   Pre FEV1/FVC % % 91  90  92  92  93  88  91   FEV1-Pre L 1.40  1.43  1.37  1.37  1.31  1.30  1.26   FEV1-Predicted Pre % 81  81  78  77  73  73  69   DLCO uncorrected ml/min/mmHg 8.16  8.62  8.92  11.26  9.70  11.19  9.36   DLCO UNC% % 50  53  55  69  59  68  57   DLCO corrected ml/min/mmHg 7.86  8.62  8.92  11.26  10.53  11.19  9.36   DLCO COR %Predicted % 48  53  55  69  64  68  57   DLVA Predicted % 64  75  78  92  89  90  81        has a past medical history of Allergy, Arthritis, Asthma, Cataract, Coronary artery calcification seen on CAT scan (08/19/2017), DDD (degenerative disc disease), thoracic, Depression, DOE (dyspnea on exertion), Fatty liver, GERD (gastroesophageal reflux disease), H/O steroid therapy, Heart palpitations (11/25/7251), Helicobacter pylori ab+, Hemorrhoids, Hiatal hernia, High cholesterol, History of chronic bronchitis, History of migraine, History of MRSA infection, Hyperlipidemia, mixed (08/19/2017), Hyperplastic colon polyp (2007), IBS (irritable bowel syndrome), Inguinal hernia, Insulin resistance, Interstitial lung disease ([place]), MVP (mitral valve prolapse), Pneumonia, Pneumonitis, hypersensitivity (HCC), PONV (postoperative nausea and vomiting), Pre-diabetes, Pulmonary fibrosis (HCC), PVC (premature ventricular contraction) (08/19/2017), Rapid heart rate, Squamous cell carcinoma of skin, Tinnitus, right ear, and Vocal cord ulcer.   reports that she has never smoked. She has never used smokeless tobacco.  Past Surgical History:  Procedure Laterality Date   34 HOUR [place] STUDY N/A 02/21/2018   Procedure: 24 HOUR PH STUDY;  Surgeon: Mauri Pole, MD;  Location: [facility];  Service: Endoscopy;   Laterality: N/A;   BREAST BIOPSY Right 2009   BIOPSY, pt denies   BREAST BIOPSY Left 2003   Benign    CATARACT EXTRACTION Left    CESAREAN SECTION     COLONOSCOPY     ESOPHAGEAL MANOMETRY N/A 02/21/2018   Procedure: ESOPHAGEAL MANOMETRY (EM);  Surgeon: Mauri Pole, MD;  Location: [facility];  Service: Endoscopy;  Laterality: N/A;   FOOT FRACTURE SURGERY  2006 or 2007   right   HYMENECTOMY     LUNG BIOPSY  09/28/2012   Procedure: LUNG BIOPSY;  Surgeon: Melrose Nakayama, MD;  Location: [facility];  Service: Thoracic;  Laterality: N/A;  lung biopsies tims three   SQUAMOUS CELL CARCINOMA EXCISION Left    left arm   TOTAL HIP ARTHROPLASTY Right 09/10/2015   Procedure: RIGHT TOTAL HIP ARTHROPLASTY ANTERIOR APPROACH;  Surgeon: Paralee Cancel, MD;  Location: [facility];  Service: Orthopedics;  Laterality: Right;   TOTAL HIP ARTHROPLASTY Left 10/03/2019   Procedure: TOTAL HIP ARTHROPLASTY ANTERIOR APPROACH;  Surgeon: Paralee Cancel, MD;  Location: [facility];  Service: Orthopedics;  Laterality: Left;  70 mins  TUBAL LIGATION     UPPER GI ENDOSCOPY     VIDEO ASSISTED THORACOSCOPY  09/28/2012   Procedure: VIDEO ASSISTED THORACOSCOPY;  Surgeon: Melrose Nakayama, MD;  Location: [facility];  Service: Thoracic;  Laterality: Right;   VIDEO BRONCHOSCOPY  11/19/2011   Procedure: VIDEO BRONCHOSCOPY WITH FLUORO;  Surgeon: Brand Males, MD;  Location: [facility];  Service: Endoscopy;;    Allergies  Allergen Reactions   Atorvastatin Other (See Comments)    Leg pain   Betadine [Povidone Iodine] Other (See Comments)    blisters   Codeine Nausea And Vomiting   Garlic Diarrhea   Hydrocodone Nausea And Vomiting   Macrobid [Nitrofurantoin] Other (See Comments)   Ofev [Nintedanib] Other (See Comments)    Abdominal pain   Onion Diarrhea   Other Other (See Comments)   Rosuvastatin Other (See Comments)    Leg pain   Shellfish Allergy Nausea And Vomiting   Sulfa Antibiotics    Sulfonamide Derivatives  Other (See Comments)    headaches    Immunization History  Administered Date(s) Administered   Fluad Quad(high Dose 65+) 07/17/2019   Hepatitis A 05/23/2010   Hepatitis B 06/23/2006   Influenza Split 08/11/2012, 08/14/2017   Influenza Whole 09/08/2011   Influenza, High Dose Seasonal PF 08/24/2016, 07/24/2017, 08/31/2018, 08/15/2020, 09/26/2020, 09/12/2021, 08/24/2022   Influenza,inj,Quad PF,6+ Mos 09/07/2013   Influenza-Unspecified 09/26/2014, 07/27/2015   MMR 05/23/2010   Moderna Sars-Covid-2 Vaccination 07/30/2021, 01/01/2022   PFIZER(Purple Top)SARS-COV-2 Vaccination 12/16/2019, 01/08/2020, 07/18/2020, 08/11/2022   Pneumococcal Conjugate-13 03/15/2014   Pneumococcal Polysaccharide-23 01/11/2018, 09/26/2020, 08/26/2021   Respiratory Syncytial Virus Vaccine,Recomb Aduvanted(Arexvy) 07/28/2022   Td 02/22/2003   Tdap 10/28/2012   Zoster Recombinat (Shingrix) 02/15/2017, 05/17/2017   Zoster, Live 12/04/2010    Family History  Problem Relation Age of Onset   Asthma Mother    Osteoarthritis Mother    Dementia Mother 37   Lymphoma Father    Diabetes Father    Hypertension Sister    Heart disease Sister    Kidney disease Sister    Allergic rhinitis Daughter    Ovarian cancer Maternal Aunt    Breast cancer Paternal Aunt    Breast cancer Paternal Aunt    Emphysema Maternal Grandmother    Asthma Maternal Grandmother    Lung disease Maternal Grandfather    Bone cancer Paternal Grandfather    Breast cancer Cousin    Breast cancer Cousin    Breast cancer Cousin    Colon cancer Neg Hx    Angioedema Neg Hx    Eczema Neg Hx    Immunodeficiency Neg Hx    Urticaria Neg Hx      Current Outpatient Medications:    acetaminophen (TYLENOL) 500 MG tablet, Tylenol Extra Strength, Disp: , Rfl:    albuterol (PROAIR HFA) 108 (90 Base) MCG/ACT inhaler, Inhale 2 puffs into the lungs every 4 (four) hours as needed for wheezing. Or coughing spells.  You may use 2 Puffs 5-10 minutes before  exercise., Disp: 1 Inhaler, Rfl: 3   Alpha-D-Galactosidase (BEANO PO), Take 1 tablet by mouth as needed (if eating onion or garlic). , Disp: , Rfl:    Ascorbic Acid (VITAMIN C) 1000 MG tablet, Take 1,000 mg by mouth daily., Disp: , Rfl:    ASHWAGANDHA PO, Take 2 capsules by mouth daily., Disp: , Rfl:    azelastine (ASTELIN) 0.1 % nasal spray, Place 1 spray into both nostrils at bedtime., Disp: , Rfl:    chlorpheniramine (CHLOR-TRIMETON) 4 MG tablet, Take  2 mg by mouth 2 (two) times daily. Takes half tablet twice daily, Disp: , Rfl:    dicyclomine (BENTYL) 10 MG capsule, Take 1 capsule by mouth daily as needed., Disp: , Rfl:    diltiazem (CARDIZEM CD) 120 MG 24 hr capsule, Take 1 capsule (120 mg total) by mouth daily., Disp: 90 capsule, Rfl: 3   diphenhydrAMINE-PE-APAP (DELSYM COUGH/COLD NIGHT TIME) 12.5-5-325 MG/10ML LIQD, Take by mouth as needed., Disp: , Rfl:    doxycycline (VIBRA-TABS) 100 MG tablet, Take 1 tablet (100 mg total) by mouth 2 (two) times daily., Disp: 10 tablet, Rfl: 0   EPINEPHrine 0.3 mg/0.3 mL IJ SOAJ injection, Inject into the muscle., Disp: , Rfl:    ezetimibe (ZETIA) 10 MG tablet, Take 1 tablet (10 mg total) by mouth daily. Please keep upcoming appt for future refills. Thank you., Disp: 90 tablet, Rfl: 3   fluticasone (FLONASE) 50 MCG/ACT nasal spray, Spray 2 sprays into each nostril every day., Disp: 48 mL, Rfl: 1   fluticasone furoate-vilanterol (BREO ELLIPTA) 100-25 MCG/ACT AEPB, Inhale 1 puff into the lungs daily., Disp: 180 each, Rfl: 3   lactase (LACTAID) 3000 units tablet, Take by mouth., Disp: , Rfl:    levocetirizine (XYZAL) 5 MG tablet, Take 5 mg by mouth at bedtime., Disp: , Rfl:    metoprolol succinate (TOPROL XL) 25 MG 24 hr tablet, Take 1 tablet (25 mg total) by mouth daily. (Patient taking differently: Take 12.5 mg by mouth daily.), Disp: 90 tablet, Rfl: 1   montelukast (SINGULAIR) 10 MG tablet, TAKE 1 TABLET BY MOUTH EVERYDAY AT BEDTIME, Disp: 90 tablet, Rfl:  2   Multiple Vitamin (MULTIVITAMIN) tablet, Take 1 tablet by mouth daily., Disp: , Rfl:    nystatin-triamcinolone (MYCOLOG II) cream, nystatin-triamcinolone 100,000 unit/g-0.1 % topical cream, Disp: , Rfl:    omeprazole (PRILOSEC OTC) 20 MG tablet, Take 20 mg by mouth daily., Disp: , Rfl:    OXYGEN, Inhale into the lungs. 2 to 3 liters as needed during the day and nite time use on/off, Disp: , Rfl:    OZEMPIC, 0.25 OR 0.5 MG/DOSE, 2 MG/1.5ML SOPN, Inject into the skin., Disp: , Rfl:    pravastatin (PRAVACHOL) 40 MG tablet, Take 1 tablet (40 mg total) by mouth every evening., Disp: 90 tablet, Rfl: 1   predniSONE (DELTASONE) 10 MG tablet, Take 5 tablets (50 mg total) by mouth daily with breakfast for 5 days, THEN 4 tablets (40 mg total) daily with breakfast for 5 days, THEN 3 tablets (30 mg total) daily with breakfast for 5 days, THEN 2 tablets (20 mg total) daily with breakfast for 5 days, THEN 1 tablet (10 mg total) daily with breakfast for 5 days, THEN 0.5 tablets (5 mg total) daily with breakfast for 5 days., Disp: 77.5 tablet, Rfl: 0   predniSONE (DELTASONE) 5 MG tablet, Take 1 tablet (5 mg total) by mouth daily with breakfast., Disp: 90 tablet, Rfl: 3   Sodium Chloride, Inhalant, 7 % NEBU, Use per nebulizer once a day to help clear sputum, Disp: 120 mL, Rfl: 3   nitroGLYCERIN (NITROSTAT) 0.4 MG SL tablet, Place 1 tablet (0.4 mg total) under the tongue every 5 (five) minutes as needed for chest pain., Disp: 25 tablet, Rfl: 12      Objective:   Vitals:   10/27/22 1546  BP: (!) 120/58  Pulse: 79  Temp: 98.1 F (36.7 C)  TempSrc: Oral  SpO2: 100%  Weight: 139 lb 3.2 oz (63.1 kg)  Height: 5' (  1.524 m)    Estimated body mass index is 27.19 kg/m as calculated from the following:   Height as of this encounter: 5' (1.524 m).   Weight as of this encounter: 139 lb 3.2 oz (63.1 kg).  _0 @  Filed Weights   10/27/22 1546  Weight: 139 lb 3.2 oz (63.1 kg)     Physical  Exam  General: No distress. Looks well. Has mask Neuro: Alert and Oriented x 3. GCS 15. Speech normal Psych: Pleasant Resp:  Barrel Chest - no.  Wheeze - no, Crackles - UL and LL YES, No overt respiratory distress CVS: Normal heart sounds. Murmurs - no Ext: Stigmata of Connective Tissue Disease - no HEENT: Normal upper airway. PEERL +. No post nasal drip        Assessment:       ICD-10-CM   1. Hypersensitivity pneumonitis ([place])  J67.9     2. ILD (interstitial lung disease) ([place])  J84.9     3. Chronic cough  R05.3     4. DOE (dyspnea on exertion)  R06.09 D-Dimer, Quantitative    Basic metabolic panel    CBC with Differential/Platelet    B Nat Peptide    B Nat Peptide    CBC with Differential/Platelet    Basic metabolic panel    D-Dimer, Quantitative    5. Acute bronchitis, unspecified organism  J20.9     6. Hiatal hernia  K44.9          Plan:     Patient Instructions     ICD-10-CM   1. Hypersensitivity pneumonitis ([place])  J67.9     2. ILD (interstitial lung disease) ([place])  J84.9     3. Chronic cough  R05.3     4. DOE (dyspnea on exertion)  R06.09     5. Acute bronchitis, unspecified organism  J20.9     6. Hiatal hernia  K44.9        Concerned for flare up versus progressive ILD v other cause  Plan  - check d-dimer, bnp, bmet , cbc blood work 10/27/2022  - if abnormal - need CTA chest rule out blood clot  - Please take Take prednisone 32m daily x 5 days, then 421monce daily x 5 days, then 3036mnce daily x 5 days, then 33m16mce daily x 5 days, then prednisone 10mg59me daily  x 5 days and then back downt to 5mg p94mday   - Take doxycycline 100mg p61mice daily x 5 days; take after meals and avoid sunlight  -  Take o2 to keep pulse ox >= 88%  -continue to  control Acid reflux well  Followup - text DR RamaswaChase Callerek with progress  - spirometry/dlco in JAn /feb 2024 - 30 min visit in JAn /feb 2024 with DR RAmaswaChase Callerevel 05 visit:  Estb 40-54 min   visit type: on-site physical face to visit  in total care time and counseling or/and coordination of care by this undersigned MD - Dr Kari Kerth Brand Malesincludes one or more of the following on this same day 10/27/2022: pre-charting, chart review, note writing, documentation discussion of test results, diagnostic or treatment recommendations, prognosis, risks and benefits of management options, instructions, education, compliance or risk-factor reduction. It excludes time spent by the CMA or [place] staff in the care of the patient. Actual time 40 min)55  SIGNATURE    Dr. Janelys Glassner Brand Males F.C.C.P,  Pulmonary and Critical Care  Medicine Staff Physician, [place] Director - Interstitial Lung Disease  Program  [facility] [place] [facility] [place], [place], [address]  Pager: [phone], If no answer or between  15:00h - 7:00h: call [phone] Telephone: [phone]  4:26 PM 10/27/2022

## 2022-10-27 NOTE — Patient Instructions (Addendum)
ICD-10-CM   1. Hypersensitivity pneumonitis ([place])  J67.9     2. ILD (interstitial lung disease) ([place])  J84.9     3. Chronic cough  R05.3     4. DOE (dyspnea on exertion)  R06.09     5. Acute bronchitis, unspecified organism  J20.9     6. Hiatal hernia  K44.9        Concerned for flare up versus progressive ILD v other cause  Plan  - check d-dimer, bnp, bmet , cbc blood work 10/27/2022  - if abnormal - need CTA chest rule out blood clot  - Please take Take prednisone '50mg'$  daily x 5 days, then '40mg'$  once daily x 5 days, then '30mg'$  once daily x 5 days, then '20mg'$  once daily x 5 days, then prednisone '10mg'$  once daily  x 5 days and then back downt to '5mg'$  per day   - Take doxycycline '100mg'$  po twice daily x 5 days; take after meals and avoid sunlight  -  Take o2 to keep pulse ox >= 88%  -continue to  control Acid reflux well  Followup - text DR Chase Caller In  week with progress  - spirometry/dlco in JAn /feb 2024 - 30 min visit in JAn /feb 2024 with DR Chase Caller

## 2022-10-28 LAB — BASIC METABOLIC PANEL
BUN: 21 mg/dL (ref 6–23)
CO2: 28 mEq/L (ref 19–32)
Calcium: 9.6 mg/dL (ref 8.4–10.5)
Chloride: 102 mEq/L (ref 96–112)
Creatinine, Ser: 1.08 mg/dL (ref 0.40–1.20)
GFR: 50.91 mL/min — ABNORMAL LOW (ref >60.00)
Glucose, Bld: 140 mg/dL — ABNORMAL HIGH (ref 70–99)
Potassium: 4.6 mEq/L (ref 3.5–5.1)
Sodium: 137 mEq/L (ref 135–145)

## 2022-10-28 LAB — CBC WITH DIFFERENTIAL/PLATELET
Basophils Absolute: 0.2 10*3/uL — ABNORMAL HIGH (ref 0.0–0.1)
Basophils Relative: 1.2 % (ref 0.0–3.0)
Eosinophils Absolute: 0.1 10*3/uL (ref 0.0–0.7)
Eosinophils Relative: 0.9 % (ref 0.0–5.0)
HCT: 43.8 % (ref 36.0–46.0)
Hemoglobin: 14.2 g/dL (ref 12.0–15.0)
Lymphocytes Relative: 14.1 % (ref 12.0–46.0)
Lymphs Abs: 1.8 10*3/uL (ref 0.7–4.0)
MCHC: 32.4 g/dL (ref 30.0–36.0)
MCV: 88.1 fl (ref 78.0–100.0)
Monocytes Absolute: 0.5 10*3/uL (ref 0.1–1.0)
Monocytes Relative: 4.3 % (ref 3.0–12.0)
Neutro Abs: 10 10*3/uL — ABNORMAL HIGH (ref 1.4–7.7)
Neutrophils Relative %: 79.5 % — ABNORMAL HIGH (ref 43.0–77.0)
Platelets: 261 10*3/uL (ref 150.0–400.0)
RBC: 4.97 Mil/uL (ref 3.87–5.11)
RDW: 14.4 % (ref 11.5–15.5)
WBC: 12.5 10*3/uL — ABNORMAL HIGH (ref 4.0–10.5)

## 2022-10-28 LAB — BRAIN NATRIURETIC PEPTIDE: Pro B Natriuretic peptide (BNP): 32 pg/mL (ref 0.0–100.0)

## 2022-11-02 ENCOUNTER — Encounter: Payer: Self-pay | Admitting: Internal Medicine

## 2022-11-02 NOTE — Telephone Encounter (Signed)
Mychart message sent by pt: Rhonda [name] "[name]"      Please ask Dr R to review all those lab results. Are the elevated items due to inflammatory process, prednisone or what.  The kidney values were really high. Should I be concerned?  I was not drinking enough water.  I have remedied that.  Please send copies of all to Dr Horald Pollen, too.    I am feeling much better. Finished the antibiotic.      MR, please advise.

## 2022-11-03 NOTE — Telephone Encounter (Signed)
Compared to 2012-2020 she was 0.'8mg'$ % 2020-2021 - 0.'9mg'$ % 2022-now - just >'1mg'$ % range  OVer time kidney slightly weaker but still within normal range.   She should follow with PCP   #2) WBC higheer that benfore but likely c/w wit th flare up   3) if she is concerned we can repeat but overall I am thinking "just monitor, not alarming"     Latest Reference Range & Units 08/19/11 16:08 01/18/12 11:39 09/26/12 11:44 09/29/12 04:00 09/30/12 04:30 10/28/12 11:22 03/09/13 09:51 08/08/13 00:09 02/08/14 14:55 10/12/14 13:22 02/26/15 15:40 09/03/15 11:15 09/11/15 05:10 09/12/15 05:12 12/08/18 09:17 09/28/19 12:26 10/04/19 02:16 02/06/20 15:47 07/08/21 15:25 10/27/22 16:24  Creatinine 0.40 - 1.20 mg/dL 0.81 0.84 0.90 0.78 0.88 1.19 (H) 0.96 0.85 0.99 1.04 0.91 0.93 0.80 0.69 0.88 0.92 0.91 0.97 1.19 1.08  (H): Data is abnormally high    Latest Reference Range & Units 10/27/22 13:24  BASIC METABOLIC PANEL  Rpt !  Sodium 135 - 145 mEq/L 137  Potassium 3.5 - 5.1 mEq/L 4.6  Chloride 96 - 112 mEq/L 102  CO2 19 - 32 mEq/L 28  Glucose 70 - 99 mg/dL 140 (H)  BUN 6 - 23 mg/dL 21  Creatinine 0.40 - 1.20 mg/dL 1.08  Calcium 8.4 - 10.5 mg/dL 9.6  GFR >60.00 mL/min 50.91 (L)  Pro B Natriuretic peptide (BNP) 0.0 - 100.0 pg/mL 32.0  WBC 4.0 - 10.5 K/uL 12.5 (H)  RBC 3.87 - 5.11 Mil/uL 4.97  Hemoglobin 12.0 - 15.0 g/dL 14.2  HCT 36.0 - 46.0 % 43.8  MCV 78.0 - 100.0 fl 88.1  MCHC 30.0 - 36.0 g/dL 32.4  RDW 11.5 - 15.5 % 14.4  Platelets 150.0 - 400.0 K/uL 261.0  Neutrophils 43.0 - 77.0 % 79.5 (H)  Lymphocytes 12.0 - 46.0 % 14.1  Monocytes Relative 3.0 - 12.0 % 4.3  Eosinophil 0.0 - 5.0 % 0.9  Basophil 0.0 - 3.0 % 1.2  NEUT# 1.4 - 7.7 K/uL 10.0 (H)  Lymphocyte # 0.7 - 4.0 K/uL 1.8  Monocyte # 0.1 - 1.0 K/uL 0.5  Eosinophils Absolute 0.0 - 0.7 K/uL 0.1  Basophils Absolute 0.0 - 0.1 K/uL 0.2 (H)  D-Dimer, Quant <0.50 mcg/mL FEU 0.39  !: Data is abnormal (H): Data is abnormally high (L): Data is  abnormally low Rpt: View report in Results Review for more information

## 2022-12-03 ENCOUNTER — Ambulatory Visit: Payer: Medicare PPO | Admitting: Cardiology

## 2022-12-04 ENCOUNTER — Telehealth: Payer: Self-pay | Admitting: Internal Medicine

## 2022-12-04 NOTE — Telephone Encounter (Signed)
Yeah can g o up to prednisone '10mg'$  per day

## 2022-12-04 NOTE — Telephone Encounter (Signed)
Pt called the office stating that she was doing well on the pred taper and said after she dropped back down to '5mg'$ , she started coughing again. Pt said this began again beginning of 1 week ago and the past 3 days have been worse.  Pt is scheduled to have PFT today 1/12 which we have rescheduled for 1/17 due to pt coughing more.  What pt wants to know is, can she increase her prednisone up higher than '5mg'$  so that way when she comes in on 1/17 for the PFT, hopefully she won't be coughing as bad again.  Dr. Chase Caller, please advise.

## 2022-12-07 NOTE — Telephone Encounter (Signed)
Advised pt of the increase to her prednisone. Nothing further needed.

## 2022-12-09 ENCOUNTER — Ambulatory Visit (INDEPENDENT_AMBULATORY_CARE_PROVIDER_SITE_OTHER): Payer: Medicare PPO | Admitting: Internal Medicine

## 2022-12-09 DIAGNOSIS — J849 Interstitial pulmonary disease, unspecified: Secondary | ICD-10-CM

## 2022-12-09 DIAGNOSIS — J679 Hypersensitivity pneumonitis due to unspecified organic dust: Secondary | ICD-10-CM

## 2022-12-09 LAB — PULMONARY FUNCTION TEST
DL/VA % pred: 81 %
DL/VA: 3.47 ml/min/mmHg/L
DLCO cor % pred: 58 %
DLCO cor: 9.41 ml/min/mmHg
DLCO unc % pred: 59 %
DLCO unc: 9.64 ml/min/mmHg
FEF 25-75 Pre: 1.68 L/sec
FEF2575-%Pred-Pre: 113 %
FEV1-%Pred-Pre: 77 %
FEV1-Pre: 1.32 L
FEV1FVC-%Pred-Pre: 115 %
FEV6-%Pred-Pre: 70 %
FEV6-Pre: 1.53 L
FEV6FVC-%Pred-Pre: 105 %
FVC-%Pred-Pre: 66 %
FVC-Pre: 1.53 L
Pre FEV1/FVC ratio: 87 %
Pre FEV6/FVC Ratio: 100 %

## 2022-12-09 NOTE — Patient Instructions (Signed)
Spirometry/DLCO performed today.

## 2022-12-09 NOTE — Progress Notes (Signed)
Spirometry/DLCO performed today.

## 2022-12-10 ENCOUNTER — Ambulatory Visit: Payer: Medicare PPO | Admitting: Internal Medicine

## 2022-12-10 ENCOUNTER — Encounter: Payer: Self-pay | Admitting: Internal Medicine

## 2022-12-10 VITALS — BP 124/78 | HR 81 | Temp 97.2°F | Ht 60.0 in | Wt 141.6 lb

## 2022-12-10 DIAGNOSIS — Z7952 Long term (current) use of systemic steroids: Secondary | ICD-10-CM

## 2022-12-10 DIAGNOSIS — K449 Diaphragmatic hernia without obstruction or gangrene: Secondary | ICD-10-CM

## 2022-12-10 DIAGNOSIS — J849 Interstitial pulmonary disease, unspecified: Secondary | ICD-10-CM

## 2022-12-10 DIAGNOSIS — J679 Hypersensitivity pneumonitis due to unspecified organic dust: Secondary | ICD-10-CM | POA: Diagnosis not present

## 2022-12-10 NOTE — Progress Notes (Signed)
#GE reflux with small hiatal hernia  - on ppi   #History of rapid heart rate not otherwise specified  - Start her on Lopressor  2008/2009 by primary care physician. History appears to correlate with onset of respiratory issues  - refuses to dc this drug as of 2014 discussion   # Hypersensitivity Pneumonitis and ILD  - Potential etiologies - cockateil x 2 for 18 years through end 2012. 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 Denies mold but lives in house built in [place] with a "weird baselment: and has humidifier  - first noted on CT chest 10/15/09 following trip to [place] (PE negative) but not described in 2003 CT chest report  - autoimmune panel: 10/23/11: Negative  -  Uderwennt bronch 11/19/11  - non diagnostic  - VATS Nov 2013 (done after initially refusing)- Ivalee. However, there is worrying trend of UIP pattern in the Upper lobes.         OV 06/17/2017  Chief Complaint  Patient presents with   Follow-up    [name]. Pt denies significant cough, denies CP/tightness, f/c/s.     She is better. Feels she does not need o2. Has been to [facility] for transplant clinic; felt too early. Seems higher dose prednisone helping but then she is also bettter t his time of the year. In May 2018 I was at [facility] and curbsided national thought leaders - feel that we could explore silent active GERD as a possibility of ILD getting wors with time.   OV 01/11/2018  Chief Complaint  Patient presents with   Follow-up    PFT done today.  Pt states she has been coughing x2 weeks since she returned from a trip to [place]. From coughing, pt has had pain in ribs. Pt states she has mild SOB which is stable due to rehab.  Pt states that Dr. Darron Doom is wanting to know if pt could possibly have eosinophilic esophagitis.   Went to [place]. Returned and has been  coughing x 2 weeks. Associated with this is some rib pain on right side. She feels she does not need o2. She feels she does not need pred/abx at this time. She also exposed to flu wit family   OV 02/08/2018  Chief Complaint  Patient presents with   Acute Visit    CXR done 02/07/18.  Pt states her throat is sore but not as bad as it was yesterday when in office and does have some left ear pain. Pt states she has some pain on right rib cage and has increased fatigue.     Rhonda [name] presents acutely.  I last saw her one month ago gave her Tamiflu for prophylaxis after flu exposure.  At that point in time she was already reducing her chronic stable prednisone of 10 mg for interstitial lung disease/hypersensitivity pneumonitis.  She had tapered herself slowly 3 or 4 weeks ago to 5 mg.  She says she was doing fairly okay but in the last 2 or 3 weeks she has had increased fatigue.  She says she goes daily swimming and then feels energized but then an hour later starts having fatigue which is more than usual.  Also in the daytime she has random fatigue.  In terms of her respiratory symptoms these are stable and in fact slightly better in terms of shortness of  breath and cough but she is having sore throat without any fever and some left-sided otalgia.  In addition she is having right-sided chest pains that are more than usual.  The chest pains have been reported before.  These are specifically at localized spots along the previous incision several years ago for interstitial lung disease.  They are tender as well to touch.  This pain is worse.  In addition she is also being bothered by arthralgia in her hand and left hip.  The left hip arthralgias old with a hand arthralgias new.  She is worried about pneumothorax recurrence and so she had a chest x-ray yesterday that shows no pneumothorax.  She has stable interstitial lung disease changes but in my personal visualization these interstitial lung disease changes  are worse compared to several years ago.  We do know that she has slowly worsening interstitial lung disease.  In terms of a walking desaturation test this shows stability since last visit.  She has seen GI for acid reflux and has pH probe study coming up  In talking to her she tells me she has been on metformin for over a month or 2.  This is new and is meant for pre-diabetes.  Apparently her hemoglobin A1c is always below 6 but is above 5.5.  Walking desaturation test on 02/08/2018 185 feet x 3 laps on ROOM AIR:  did walk normal pace with forehead probe desaturate. Rest pulse ox was 99%, final pulse ox was 93%. HR response 72/min at rest to 93/min at peak exertion. [name]  Did not Desaturate < 88% . Rhonda [name] yes did  Desaturated </= 3% points. Rhonda [name] yes did get tachyardic   Dg Chest 2 View  Result Date: 02/07/2018 CLINICAL DATA:  Chest wall pain for 2 weeks EXAM: CHEST - 2 VIEW COMPARISON:  CT 08/20/2017, radiograph 10/11/2015 FINDINGS: Coarse interstitial pattern consistent with pulmonary fibrosis, similar distribution compared to prior. No focal pulmonary opacity or pleural effusion. Stable cardiomediastinal silhouette with aortic atherosclerosis. No pneumothorax. Degenerative changes of the spine. IMPRESSION: No active cardiopulmonary disease. Similar appearance of diffuse pulmonary fibrosis. Negative for a pneumothorax. Electronically Signed   By: Donavan Foil M.D.   On: 02/07/2018 14:26    OV 04/13/2018  Chief Complaint  Patient presents with   Follow-up    Pt had pre spiro and DLCO PFT prior to OV. Pt has increase of productive cough-thick white in last week. Pt has some wheezing, and allergy issues.   Rhonda [name] presents for routine follow-up.  I saw her acutely just approximately 2 months ago.  Then end of April 2019 she saw a nurse practitioner again acutely and was given antibiotic and prednisone.  She says she felt better after that but in the  last week or so due to increased pollen load in the community and also moving furniture because her daughter is relocating out of her home she started having more cough.  There is no change in her dyspnea with the cough is really bad.  This is despite Singulair and Chlor-Trimeton.  Those things to help but just take the edge off.  She feels that the pollen making the cough worse.  There is no change in dyspnea.  Walking desaturation test documented below his baseline.  She had pulmonary function test today but the [facility] shows significant decline compared to February 2019 and she feels surprised by this.  DLCO is baseline unchanged and a walking desaturation test  limited in the office is also unchanged.  She does not have any fever but has significant postnasal drip.  She has not followed up at [facility] transplant clinic or the allergist recently.  OV 08/16/2018  Subjective:  Patient ID: Rhonda [name], female , DOB: [birth year] , age 41 y.o. , MRN: [id number] , AD[address]ESS: [address] [address]   08/16/2018 -   Chief Complaint  Patient presents with   Follow-up    PFT performed today.  Pt states she has had a lot of mucus the month of September 2019 so she has been taking the Chlor-Trimeton. States other than that she has been able to do a lot more activities and has been swimming a lot more. Pt states she believes the SOB is stable.     HPI KENYADA DOSCH 72 y.o. -continues to do well.  She is taking higher dose of prednisone 10 mg/day.  Also the summer usually she is doing well.  She is not having much of a cough.  Lung function test shows improvement compared to tests done earlier this year.  In fact she is almost as good as much 2018.  Still compared to 2014-2016 she has progressive lung disease.  Walking desaturation test is stable.  She needs a high-dose flu shot but we do not have stock today.  She has lost some weight intentionally with better dietary control.  She is  exercising regularly swimming.  We discussed ofev  potential future therapy if the drug is approved for this indication.  We will know study results in a year or so.  She is open to the idea but is worried somewhat about her irritable bowel syndrome flaring up.  She is no longer following at the [facility] lung transplant clinic given stability lung function  Other issues: She always has right-sided chest wall pain to the site of lung biopsy.  The only way this is been resolved as by her limiting her use of bra .  Also her irritable bowel syndrome is under control but in case it flares up she wants a refill of dicyclomine     OV 12/15/2018  Subjective:  Patient ID: Rhonda [name], female , DOB: November 22, 1951 , age 46 y.o. , MRN: [id number] , AD[address]ESS: [address] [place] [address]   12/15/2018 -   Chief Complaint  Patient presents with   Follow-up    Pt states she has been doing better since last visit with TP but states she has been having pain in her rib cage x2 years but states it has become more intense.     HPI Rhonda [name] 72 y.o. -returns for follow-up of her ILD due to hypersensitivity pneumonitis.  She did a clinic visit also for research with the ILD-pro registry study.  Since I last saw her in September 2019 she had a flareup and required higher doses of prednisone.  Since then she is returned to baseline of 5 mg prednisone and she feels good.  She is stable.  Walking desaturation test shows stability.  We have been discussing over the last few months about taking nintedanib based on new data and progressive non--IPF ILD's.  She has trepidation for this because of irritable bowel syndrome.  After much discussion she is willing to try 100 mg once daily for a month and then escalate to 100 mg twice daily which is the therapeutic dose.  She will do this once after insurance approval which we  suspect will happen over the next few months.  Her main issue is that her right  sided infra-axillary chest pain is getting worse.  This started after her surgical lung biopsy and pneumothorax on the right side.  It is random and happens with twisting.  But now days it happens once every few days.  Previously it used to happen once every few weeks.  Sometimes it is excruciating but then she is left in pain for a few days.  Applying Abrol sudden twisting of her body makes it worse.  Is a lancinating pain.  She did try gabapentin some years ago for chronic cough .  She did tolerate the gabapentin well.  She does not remember if it helped the pain or not but clearly back and the pain was much less intense.  She is willing to try this again.  I recommended a CT scan of the chest but she is going to have radiation for a CT coronary angiogram because of high levels of coronary artery calcification.  Therefore written to the radiologist to see if he can look at the lung parenchyma with a CT angiogram    OV 05/09/2019  Subjective:  Patient ID: Rhonda [name], female , DOB: 08/10/1951 , age 31 y.o. , MRN: [id number] , AD[address]ESS: [address] [place] [address]   05/09/2019 -   Chief Complaint  Patient presents with   ILD (Interstitial Lung Disease)     HPI Rhonda [name] 72 y.o. -follow-up for chronic care physician pneumonitis on daily 5 mg prednisone.  She has problems with right-sided chest wall pain that is neuropathic.  She tells me since her last visit January 2020 she has been isolating and social distancing because of the pandemic.  Overall she is been stable.  Her symptom scores are mild and documented below.  She continues with prednisone.  She was supposed to have pulmonary function test but this was held because of the pandemic.  Her walking desaturation test shows she is stable.  She is supposed to have pulmonary function test tomorrow but wanted to see me today.  In the interim she did have a cardiac CT scan of the chest that shows 94th percentile of coronary calcium.   She could not get a CT angiogram done because of PVC.  A nuclear medicine stress test was done and I reviewed this result and it was normal.  A Holter test is pending.  She is kind of nervous because of the ongoing COVID-19 situation and safety of doing a Holter test.  In terms of her right chest wall pain she is adapted to it.  She does not use any bra  OV 08/22/2019  Subjective:  Patient ID: Rhonda [name], female , DOB: February 25, 1951 , age 47 y.o. , MRN: [id number] , AD[address]ESS: [address] [place] [address]   08/22/2019 -   Chief Complaint  Patient presents with   Follow-up    needs sx clearance for L hip replacement- not yet scheduled, pending clearance.     Patient lung disease chronic hypersensitive pneumonitis on chronic daily prednisone 5 mg/day  HPI Rhonda [name] 72 y.o. -presents for follow-up of her interstitial lung disease due to chronic hypersensitive pneumonitis.  After last visit in June 2020 given her PFT stability and also her aversion to the potential side effects with nintedanib she decided to just continue with prednisone daily 5 mg/day.  Given the pandemic she is walking 1 mile a day without stopping.  Her interstitial lung disease symptom score is actually slightly better.  Overall she reports stability in her interstitial lung disease.  She has a new issue of getting preoperative clearance for her left hip.  She says the left hip is bone-on-bone and she does not want to take Aleve.  She prefers to have surgery.  Dr. Paralee Cancel is the surgeon.  Surgery date has not been set.  She is very functional.  She has normal renal function.  Normal nutritional status.  No anemia.  No respiratory infections in the last 1 month.  She is not on oxygen.  Age 22.  The surgeries in the left hip and the duration of surgery is under 3 hours.  Anesthesia will be general.      OV 01/30/2020  Subjective:  Patient ID: Rhonda [name], female , DOB: 1951-06-18 , age 3 y.o. , MRN:  [id number] , AD[address]ESS: [address] [address] Patient lung disease chronic hypersensitive pneumonitis on chronic daily prednisone 5 mg/day  01/30/2020 -   Chief Complaint  Patient presents with   Follow-up    PFT performed 3/8.  Pt states she believes she has been going downhill for the past 2 months. Pt was started on O2 at last OV and wears it prn. Pt does have an occ cough.     HPI Rhonda [name] 72 y.o. -presents for follow-up of interstitial lung disease secondary to chronic hypersensitive pneumonitis.  She maintains herself on prednisone 5 mg/day.  In the interim she has had hip surgery and this did wonders for her.  She is able to walk longer distances.  However she does notice that her dyspnea has declined.  She says since January 2021 his symptoms have declined particularly with cough.  Usually in the winter she goes through a cycle of severe cough and exacerbation that requires higher levels of prednisone.  She has been social distancing well and masking well.  She states there is absolutely no mold or water any antigen exposure at home.  But she feels it is her allergies acting up and making her more symptomatic.  She has had a Covid vaccine and is wondering about either going back to swimming or starting pulmonary rehabilitation.  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  In fact when we walked her today with the mask she did drop down to 89%.  This is a decline for her.  She had pulmonary function test and this shows significant decline in FVC and DLCO.  In review of this that.  Where she has declined this much but is always bounce back with steroids.  At last visit we discussed nintedanib for her as a way to prevent progression of her ILD but given her concerns of GI side effects and the presence of irritable bowel syndrome she is declined.  Infective have this conversation with her  including her joining the [employer] at [facility] but she has always been worried about the GI side effects with nintedanib.  This time she is more open to it because of the decline in lung function.  She wants to see an allergist  She also told me that she continues to have a restrictive chest pain particularly on the right side lower area.  She no longer wears a brassiere it has been nearly 3 years since she had a high-resolution CT chest.  She  is open to having one now.  Last liver function test was normal in January 2020.  Last renal function was normal in November 2020.      OV 03/19/2020  Subjective:  Patient ID: Rhonda [name], female , DOB: 1951-01-06 , age 34 y.o. , MRN: [id number] , [address]ESS: [address] [place] [address]  Chronic hypersensitive pneumonitis interstitial lung disease on prednisone daily 10 mg 03/19/2020 -   Chief Complaint  Patient presents with   Follow-up    Pt states she is doing better since last visit and states her breathing has also improved.     HPI Rhonda [name] 72 y.o. -at last visit in March 2021 there was decline in lung function.  Therefore we started nintedanib.  She took 1 tablet of 150 mg and then had immediate abdominal pain and fever.  Therefore she is decided against taking this.  I also support this decision because I do not think she will tolerate nintedanib.  We did extensive interstitial lung disease question a history again at that time.  She had taken 1 dose of nitrofurantoin.  Have indicated to her that she should never take this medicine again.  In addition discovered that she might have some feather jackets at home she has now gotten rid of it.  She is now doing pulmonary rehabilitation.  Her overnight oxygen desaturation test is negative.  At pulmonary rehabilitation she tells me she does not drop below 91% and 1 time she went to 89%.  Otherwise overall doing great.  Her cardiologist is increased her Lopressor.  Her daughter is getting  married and she is busy with the wedding planning this is being emotionally stressful for her.  Her allergist has increased her recommendations and this is also helping her.  Her allergist is retiring and she wanted recommendations for new allergist.  I have recommended Dr. Fredderick Phenix and Dr. Remus Blake.  Incidentally that right-sided chest pain that she had is also better with increased prednisone of 10 mg/day.  She feels prednisone is working really well for her.    OV 08/29/2020   Subjective:  Patient ID: Rhonda [name], female , DOB: 1951/06/05, age 25 y.o. years. , MRN: [id number],  ADDRESS: [address] [address] PCP  Hayden Rasmussen, MD Providers : Treatment Team:  Attending Provider: Brand Males, MD Patient Care Team: Hayden Rasmussen, MD as PCP - General (Family Medicine) Minus Breeding, MD as PCP - Cardiology (Cardiology) Brand Males, MD as Consulting Physician (Pulmonary Disease) Verl Blalock, Marijo Conception, MD (Inactive) (Cardiology) Kennith Center, RD as Dietitian (Family Medicine) Minus Breeding, MD as Consulting Physician (Cardiology)  Follow-up chronic hypersensitive pneumonitis on chronic prednisone 10 mg/day.  Last high-resolution CT chest March 2021.  Intolerant to nintedanib.  Chief Complaint  Patient presents with   Follow-up    Pt states she had been doing well since last visit up until 3-4 weeks ago and started having problems with her breathing and has had to use her O2 with activities. Pt also has been coughing a lot and is getting up clear phlegm. Pt also has had some mild wheeze.       HPI Rhonda [name] 72 y.o. -returns for follow-up.  Since I last saw her her daughter is now married.  She had a great wedding.  She was able to enjoy this and dance quite well.  She barely used her oxygen her effort tolerance was good.  Some 3 weeks [place]  Apparently she had difficulty getting her oxygen tank  to clinic to the electrical port.  She noticed with exertion a pulse ox dropping to the 70s.  She said even before she left a few days prior to that she started having increasing cough.  She notices that these cough cycles get exacerbated in the fall in the winter.  Looking back at her pulmonary function test there is always a dip between October and March each year.  Between 2015 and 2017 there was a decline and since then there is a waxing and waning quality with dips in the winter in the fall season.  Each time she responds to prednisone.  Most recent pulmonary function test in August 2021 is actually improved compared to March 2021.  However today she is feeling worse.  She feels something is in her airway.  She feels dry air in the house and the animals bringing leaves from outside is contributing.  She says she feels better upstairs than downstairs where the dogs.  She again denies any mold or mildew.  Denies any feather jacket exposure.  She feels she will benefit from another round of prednisone.  She feels in the past antibiotics have not helped her as much as prednisone.   In terms of chronic ILD HP: She not tolerate nintedanib in the past.  I explained to her that I am concerned about progression and her decline in functional quality and the risk for that.  We discussed pirfenidone as an alternative.  Discussed the fact is less well studied although have no reason to see why it would not be effective.  Explained the side effect profile.  She is willing to try this.  We decided to go donor samples from the patient support group  Oxygen qualification: We walked her and she qualified for oxygen today.  Is for portable oxygen with [employer]  Other issue: Spring allergies: -Her allergist is retired.  We discussed options of referral.  Referred her to Dr. Hardie Pulley referred to     OV 01/14/2021  Subjective:  Patient ID: Rhonda [name], female , DOB: 03/16/1951 , age 66 y.o. , MRN: [id number] ,  ADDRESS: [address] [address] PCP Hayden Rasmussen, MD Patient Care Team: Hayden Rasmussen, MD as PCP - General (Family Medicine) Minus Breeding, MD as PCP - Cardiology (Cardiology) Brand Males, MD as Consulting Physician (Pulmonary Disease) Verl Blalock, Marijo Conception, MD (Inactive) (Cardiology) Kennith Center, RD as Dietitian (Family Medicine) Minus Breeding, MD as Consulting Physician (Cardiology)  This Provider for this visit: Treatment Team:  Attending Provider: Brand Males, MD    01/14/2021 -   Chief Complaint  Patient presents with   Follow-up    Nauseous, loss of appetite    Follow-up chronic hypersensitive pneumonitis on prednisone 5 mg/day and also pirfenidone submaximal dose of 2 pills 3 times daily since October 2021.  On ILD-pro registry protocol  - failed ofev   - failed esbrit due to side effects Faythe Ghee 2022  She is also on Breo  HPI ALISSE TUITE 72 y.o. -returns for follow-up.  She is now on pirfenidone along with low-dose prednisone.  She feels the pirfenidone is not reacting well with her at all.  She has intermittent loss of taste at least twice a week.  She has loss of appetite and she has forces herself to eat.  She also has fatigue.  She says that  if I strongly recommended then she will continue to soldier on with it.  Otherwise she feels less short of breath.  Pulmonary function test reviewed and is actually stable/slightly improved.  She says the cough is extremely well controlled except today after the pulmonary function test she was coughing quite a bit.  She is looking forward to going back to swimming.  She has had a Covid vaccine in booster and also status post EVUSHELD monoclonal antibody prophylaxis.  We discussed the possibility about possibility of enrolling in inhaled nitric oxide device/administration study.  This is to see if people can improve in the shortness of breath scale and also physical activity.  She took the consent form and  is deliberating.  She thinks she might find the study and inconvenience to her lifestyle where she is wanting to be more active.  We went over several details of this research protocol  She has a new symptom of like itching sensation deep in the right flank.  She feels it is not the skin it is from deep inside.  She is wondering if it could be related to pirfenidone.  Started after going on pirfenidone.  Explained to her that I am not seeing this in other patients with pirfenidone but will have to be open minded.  She does not apply Vaseline to her skin in the winter.  We discussed the possibility this could be dry skin related but she does not have other features of dry skin.  Nevertheless she is willing to try Vaseline.   Results for CHAPP   OV 07/08/2021  Subjective:  Patient ID: Rhonda [name], female , DOB: [birth year] , age 82 y.o. , MRN: [id number] , ADDRESS: [address] [place] [address] PCP Hayden Rasmussen, MD Patient Care Team: Hayden Rasmussen, MD as PCP - General (Family Medicine) Minus Breeding, MD as PCP - Cardiology (Cardiology) Brand Males, MD as Consulting Physician (Pulmonary Disease) Verl Blalock, Marijo Conception, MD (Inactive) (Cardiology) Kennith Center, RD as Dietitian (Family Medicine) Minus Breeding, MD as Consulting Physician (Cardiology)  This Provider for this visit: Treatment Team:  Attending Provider: Brand Males, MD    07/08/2021 -  ACUTE VISIT Chief Complaint  Patient presents with   Acute Visit    Pt states that she did start taking zpak yesterday 8/15 and said her cough is doing some better after being on second day of abx and said that she started prednisone today 8/16. States that she still has increased SOB. Pt did take a covid test which came back negative.   Follow-up chronic hypersensitive pneumonitis on prednisone 5 mg/day and also pirfenidone submaximal dose of 2 pills 3 times daily since October 2021.  On ILD-pro registry protocol -  failed ofev   - failed esbrit due to side effects marh 2022  She is also on Breo   HPI KEISHAWNA CARRANZA 72 y.o. -this is an acute visit.  Last seen in February 2022 after that she was supposed to see me back in 3 months.  But this visit has been scheduled acutely.  She tells me that early in May 2022 she went to [facility].  They rented a house.  She believes that the bedroom had some mold.  She did not visually confirmed the mold but husband might have.  She says shortly after going there and spending a week she started having worsening cough with hoarseness of voice.  This cough is persisted all along and the hoarseness has persisted  as well.  The in June 2022 she had a short course of steroids that did not help.  She believes the dosing was not strong enough.  In July 2022 she had some back issues and was given 30 mg of prednisone for approximately 10 days with a taper and this helped her back.  But all along the cough is persisted and severe hoarseness is persisted.  Then approximately a week ago she started getting shortness of breath along with yellow sputum.  The cough got worse .  She states even at baseline she brings her a lot of white sputum and has to cough a lot.  [place] and a couple of days into the illness.  At this point in time she started noticing she was more easily desaturating.  She is not able to swim.  She says walking to the car her pulse ox dropped to 84%.  Then the yellow phlegm started getting worse.  She called in yesterday and we prescribed Z-Pak and a prednisone burst.  She says now with the Z-Pak the sputum color is improving.  A week ago she also saw Dr. Blenda Nicely for chronic cough.  100 mg 3 times daily of gabapentin has been prescribed.  She is slowly increasing it.  She is worried about the side effects of this drug.  Apparently her vocal cord had ulcers from repeated coughing.  ILD symptom score shows worsening.  In the office  she is very hoarse   Of note she stopped her pirfenidone in February/March 2022 because of side effects - gerd  She uses oxygen with exertion.  There is no leg swelling or hemoptysis.  Started z pak yesterday Started 12d pred taper yesterday     PFT   OV 08/14/2021  Subjective:  Patient ID: Rhonda [name], female , DOB: [birth year] , age 22 y.o. , MRN: [id number] , ADDRESS: [address] [place] [address] PCP Hayden Rasmussen, MD Patient Care Team: Hayden Rasmussen, MD as PCP - General (Family Medicine) Minus Breeding, MD as PCP - Cardiology (Cardiology) Brand Males, MD as Consulting Physician (Pulmonary Disease) Verl Blalock, Marijo Conception, MD (Inactive) (Cardiology) Kennith Center, RD as Dietitian (Family Medicine) Minus Breeding, MD as Consulting Physician (Cardiology)  This Provider for this visit: Treatment Team:  Attending Provider: Brand Males, MD  Follow-up chronic hypersensitive pneumonitis on prednisone 5 mg/day and also pirfenidone submaximal dose of 2 pills 3 times daily since October 2021.  On ILD-pro registry protocol - failed ofev   - failed esbrit due to side effects Faythe Ghee 2022  She is also on Breo  08/14/2021 -   Chief Complaint  Patient presents with   Follow-up    Pt states she is feeling better since last visit. States she did receive her lighterweight POC which has been working well for her.     HPI TEAH VOTAW 72 y.o. -seen last month with worsening symptoms acute bronchitis/flare.  Given Z-Pak and prednisone.  She is significantly better.  She tells me that even though she is better and his subjective symptom score is better below.  She still feels not back to her May 2022 baseline.  She says in the neighborhood she has to use more oxygen.  She is interested in a backpack for oxygen.  She is not able to swim as well as she used to.  She says acid reflux is under better control now after seeing Dr.  Marcellino in ENT.  She is wondering  about starting pirfenidone she definitely does not want to do nintedanib again.  But even with pirfenidone she is reluctant.  We discussed CellCept because of progression but because of the immunosuppression she is reluctant.  We resolved that she would increase the prednisone to 10 mg/day and reassess.  She was supposed to have done a spirometry today but it did not happen.  She will have it done again in 4 weeks.  Have requested a schedule for that.    The main concern right now even though is better is that she is not to her baseline as of a year ago and she feels like she is having slowly progressive disease interstitial lung disease/chronic HP.       OV 10/03/2021  Subjective:  Patient ID: Rhonda [name], female , DOB: Apr 10, 1951 , age 14 y.o. , MRN: [id number] , AD[address]ESS: [address] [id number] PCP Hayden Rasmussen, MD Patient Care Team: Hayden Rasmussen, MD as PCP - General (Family Medicine) Minus Breeding, MD as PCP - Cardiology (Cardiology) Brand Males, MD as Consulting Physician (Pulmonary Disease) Verl Blalock, Marijo Conception, MD (Inactive) (Cardiology) Kennith Center, RD as Dietitian (Family Medicine) Minus Breeding, MD as Consulting Physician (Cardiology)  This Provider for this visit: Treatment Team:  Attending Provider: Brand Males, MD    10/03/2021 -   Chief Complaint  Patient presents with   Follow-up    Pt states her breathing has become worse since last visit. States she is having to use her O2 almost all the time now with exertion.    Follow-up chronic hypersensitive pneumonitis on prednisone 5 mg/day and also pirfenidone submaximal dose of 2 pills 3 times daily since October 2021.  On ILD-pro registry protocol - failed ofev due to side efect  - failed esbrit due to sde effects marvh 2022  -Last echo 2018  -Last high-res CT March 2021. -> Nov 2022 stable between 2  She is also on Breo  HPI Rhonda [name] 72 y.o. -returns for  follow-up.  In this visit she is categorically saying that she is significantly worse.  This point in particular relationship to dyspnea on exertion.  Her weight itself is stable.  She is currently on 5 mg prednisone per day [last time I thought I increased it to 10 mg/day].  Nevertheless she says other interim changes is that she has had slow weight gain.  BMI is 30.  She started semaglutide for hemoglobin A 1.C 6.5.  Is also to help weight.  She was also after seeing Dr. Blenda Nicely for her cough and significant amount of anti-PPI therapy.  She said this made her diarrhea worse.  She then reduced it and currently is taking omeprazole 20 mg/day in the daytime and 40 mg at night and in between Pepcid as well.  She stopped the gabapentin.  She feels after reducing the PPI the diarrhea is improved.  The cough is not as bad as it was even after stopping gabapentin but she continues to be fatigued.  She complains of bloating.  She feels her stomach is full.  She feels acid reflux is worse.  Acid reflux is worse after reducing the dose of H2 blockade.  She also has early satiety.  She also states when she coughs she vomits.  She feels she needs to see Dr. Ethlyn Gallery.  I have messaged him and he hasagreed for him or his PA to see her soon.  Nevertheless symptoms are significantly worse as seen on the symptom score below.  She is also reporting increased tachycardia with exertion and apparently Dr. Percival Spanish adjusted her beta-blocker.  She has upcoming appointment with them.    She says that at times when she desaturates easily at home even for minimal exertion.  We walked her today with a forehead probe and she actually did much better than expected going down to 89% only at the end of 3 labs [this is actually an improvement from the recent past versus stability].  She might have an erratic finger pulse ox probe at home.   Her pulmonary function shows a significant decline in DLCO.  Last echocardiogram 2018.  Last  high-resolution CT chest March 2021.  Never had right heart catheterization.  Hemoglobin normal 14.7 g% in August 2022.           OV 10/21/2021  Subjective:  Patient ID: Rhonda [name], female , DOB: June 29, 1951 , age 57 y.o. , MRN: [id number] , ADDRESS: [address] [id number] PCP Hayden Rasmussen, MD Patient Care Team: Hayden Rasmussen, MD as PCP - General (Family Medicine) Minus Breeding, MD as PCP - Cardiology (Cardiology) Brand Males, MD as Consulting Physician (Pulmonary Disease) Verl Blalock, Marijo Conception, MD (Inactive) (Cardiology) Kennith Center, RD as Dietitian (Family Medicine) Minus Breeding, MD as Consulting Physician (Cardiology)  This Provider for this visit: Treatment Team:  Attending Provider: Brand Males, MD  Type of visit: Video Circumstance: COVID-19 national emergency Identification of [name] with 10/29/1951 and MRN [id number] - 2 person identifier Risks: Risks, benefits, limitations of telephone visit explained. Patient understood and verbalized agreement to proceed Anyone else on call: no Patient location:  her home This provider location: [address]; [place]; [place] [address]. [facility]. [phone]    10/21/2021 -     Follow-up chronic hypersensitive pneumonitis on prednisone 5 mg/day and also pirfenidone submaximal dose of 2 pills 3 times daily since October 2021.  On ILD-pro registry protocol - failed ofev due to side efect  - failed esbrit due to sde effects marvh 2022  -Last echo 2018  -Last high-res CT March 2021.  She is also on Breo HPI JAXSON KEENER 72 y.o. -returns for follow-up via video visit to discuss test results.  She says currently she is just doing PPI lower dose 20 mg omeprazole in the daytime and famotidine at night.  With this her diarrhea is better.  Also bloating is better.  Correlating with this for she feels her lungs are stable.  She does say  that even now when she bends down she can desaturate.  She is not using oxygen at night.  Her primary care is testing her over no at night to see if she really needs it at night.  Nevertheless she feels somewhat better.  She had echocardiogram because of concern of pulmonary hypertension and there is no evidence of pulmonary hypertension.  She had high-resolution CT chest which the radiologist was described as minimally progressive since 18 months earlier in March 2021.  She had pulmonary function test that shows the FVC to be stable in the last few to several months but the DLCO to show slight decline.  Nevertheless the pattern at least compared to 2 years ago is 1 of progressive ILD on the pulmonary function test.  Certainly the hypoxia with easy exertion compared to a few years ago or a year ago is also  a sign that her ILD is getting worse.  She is likely stable or stabilized in the last few to several months.  We discussed going challenge again with nintedanib or pirfenidone but she is not interested.  We discussed doing CellCept and discussed the side effect profile of this.  She is not interested.  We discussed transplant referral for evaluation now that she is progressing so she can get plugged in and have a good conversation.  She is reflected on this from the last visit and she is not interested  We discussed the acid reflux controlled she feels she is at a good place right now with good acid reflux controlled with PPI in the morning and H2 blockade at night.  She has seen GI and at this point I believe they are not going to do pH probe.  I reviewed the note.  She continues on Breo and prednisone 5 mg/day which she plans to continue.  She has heard about the promedior trial with IV Pnetraxin for IPF.  This might be a study for non-- IPF progressive phenotype.  She prefers to do the study because it bypasses the GI route and it is an IV study.  We do not know if the study is going to happen.  We do  not know if you are going to be a site.  If these things all get aligned then maybe we could consider that.        OV 07/21/2022  Subjective:  Patient ID: Rhonda [name], female , DOB: 1951/04/21 , age 61 y.o. , MRN: [id number] , ADDRESS: [address] [id number] PCP Hayden Rasmussen, MD Patient Care Team: Hayden Rasmussen, MD as PCP - General (Family Medicine) Minus Breeding, MD as PCP - Cardiology (Cardiology) Brand Males, MD as Consulting Physician (Pulmonary Disease) Verl Blalock, Marijo Conception, MD (Inactive) (Cardiology) Kennith Center, RD as Dietitian (Family Medicine) Minus Breeding, MD as Consulting Physician (Cardiology)  This Provider for this visit: Treatment Team:  Attending Provider: Brand Males, MD  She is also on [facility]  07/21/2022 -   Chief Complaint  Patient presents with   Follow-up    PFT performed today.  Pt states she has been doing good since last visit.     HPI CHABELI BARSAMIAN 72 y.o. -returns for follow-up.  Overall in the summer months she is doing well.  She went to the [place] in [place].  In the areas where [place] was [place] of [place] and clean air she was able to walk down the beach 200 steps and including climb up with the oxygen without desaturation and feeling good.  But when she got to [facility] where there was pollution she started having cough.  She currently feels really good.  She feels well symptom scores are improved.  Walking desaturation test is also improved.  She attributes this to some weight loss and overall healthy living.  She is on Ozempic that is helping her lose weight.  She is swimming.  She does have some cough recently for the last week because of allergies.  Her dog Ival Bible passed away.  Therefore she is taking care of the other dog and this is increasing her allergies and cough but still overall symptoms are improved.  She continues on prednisone 5 mg/day.  Currently she not  interested in any prednisone burst.  Social: She is expecting to be a grandmother in a few months.  OV 10/27/2022  Subjective:  Patient ID: Rhonda [name], female , DOB: 02-23-1951 , age 83 y.o. , MRN: [id number] , AD[address]ESS: [address] [id number] PCP Hayden Rasmussen, MD Patient Care Team: Hayden Rasmussen, MD as PCP - General (Family Medicine) Minus Breeding, MD as PCP - Cardiology (Cardiology) Brand Males, MD as Consulting Physician (Pulmonary Disease) Verl Blalock, Marijo Conception, MD (Inactive) (Cardiology) Kennith Center, RD as Dietitian (Family Medicine) Minus Breeding, MD as Consulting Physician (Cardiology)  This Provider for this visit: Treatment Team:  Attending Provider: Brand Males, MD    10/27/2022 -   Chief Complaint  Patient presents with   Acute Visit    Pt has been having complaints of increased SOB and feels like she is having a flare up. Pt states she took zpak and prednisone about a month ago. States she has had some desats in oxygen levels.     HPI MARISHA RENIER 72 y.o. -presents for follow-up.  This is an acute visit.  She is now a grandmother.  The granddaughter's name is Drue Stager.  She is pretty excited about that.  However she is having deterioration in her symptoms.  She says when she saw me in August 2023 she was actually quite well but she thought looking back she might of started with a cough.  She did travel [place] but then starting September 2023 she started having worsening cough which typically happens in the fall with the leaves get dry.  I prescribed 8-10-day prednisone burst along with Z-Pak.  She states the prednisone always helps her.  However she feels that the prednisone I prescribed was not probably strong enough.  Since then she is having a deterioration in the cough.  She also feels more fatigued.  There is white sputum coming with this.  She is also having worsening shortness of breath for the last 2 or 3 weeks.  She  feels this time the shortness of breath is different and is the more dominant symptoms.  Usually cough is the more dominant symptoms.  The deterioration in shortness of breath really suddenly in the last few weeks.  There are some associated exertional chest pain.  She did see Dr. Percival Spanish recently.  I reviewed his note.  She has had a previous normal stress test so he felt reassured this was not cardiac.  He did contemplate stopping the metoprolol given her "reactive airway disease" [she has hypersensitivity pneumonitis and can occasionally wheeze but I did leave her on metoprolol being a better once because of the beneficial effects on her heart rate with exertion].  Her symptom score showed deterioration  Her walking desaturation test also shows significant deterioration.  Similar to September 2022 but a lot worse.  This the worst its ever been.  There is no fever     OV 12/10/2022  Subjective:  Patient ID: Rhonda [name], female , DOB: 07-02-1951 , age 11 y.o. , MRN: [id number] , AD[address]ESS: [address] [id number] PCP Hayden Rasmussen, MD Patient Care Team: Hayden Rasmussen, MD as PCP - General (Family Medicine) Minus Breeding, MD as PCP - Cardiology (Cardiology) Brand Males, MD as Consulting Physician (Pulmonary Disease) Verl Blalock, Marijo Conception, MD (Inactive) (Cardiology) Kennith Center, RD as Dietitian (Family Medicine) Minus Breeding, MD as Consulting Physician (Cardiology)  This Provider for this visit: Treatment Team:  Attending Provider: Brand Males, MD   Follow-up chronic hypersensitive pneumonitis on prednisone 5 mg/day and also pirfenidone submaximal dose of  2 pills 3 times daily since October 2021.  On ILD-pro registry protocol - failed ofev due to side efect  - failed esbrit due to sde effects  March 2022  -Last echo 2018  -Last high-res CT Nov 222  - declind lung tx referral 2023    12/10/2022 -   Chief Complaint  Patient presents with    Follow-up    Pft results from yesterday. Pt states that 10 mg of prednisone is helping.      HPI AUNDRAYA DRIPPS 72 y.o. -here for follow-up.  After the last visit she called saying 5 mg of prednisone was not helping her.  She told me this time that at 50 mg of prednisone she feels great but then as she tapers and she gets into 10 mg prednisone she starts getting symptoms and at 5 mg symptoms are significantly worse.  She then called and she is back up to 10 mg and she feels better with just very mild symptoms.  In fact walking desaturation test is more stable her exam is more stable.  She feels she is got hypersensitive airways.  I did indicate to her that it is possible there is allergic or eosinophilic inflammatory component.  However her blood eosinophils have always been normal.  But then it is Mast by chronic prednisone.  Currently she is on 10 mg of prednisone.  We took a shared decision for her to stay on 10 mg prednisone acknowledging the risk because this seems to be the best balance between symptom relief and quality of life.  Even exercise hypoxemia is better.  She did notice that recently in the last few months perfume particularly bothering her.  But otherwise she is stable.  She plans to revisit with her allergist Dr. Tiajuana Amass   Current pulmonary function test was reviewed and is stable.    SYMPTOM SCALE - ILD Sept 2020 01/30/2020  08/29/2020  01/14/2021 146# 07/08/2021 147# - off esbriet. Sic past week 08/14/2021 149# - pred only. No antifibrotic 10/03/2021 148# - pred '5mg'$  07/21/2022 137# 10/27/2022   O2 use  o2 with ex o2 withe ex 2L     2LL o2 with ex 2L with ex 2 laps on RA  Shortness of Breath  0 -> 5 scale with 5 being worst (score 6 If unable to do)         At rest 0 0 1 0 1 0 1 0 1  Simple tasks - showers, clothes change, eating, shaving 01 1 1 1.'5 2 1 2 1 2  '$ Household (dishes, doing bed, laundry) 0 '3 3 3 4 2 '$ 3.'5 2 3  '$ Shopping '1 1 1 2 2 1 1 1 2  '$ Walking level at own  pace '1 2 2 1 2 2 3 1 2  '$ Walking up Stairs '2 3 3 3 4 3 4 3 4  '$ Total (30-36) Dyspnea Score '5 10 11 '$ 10.'5 15 9 '$ 14.'5 8 14  '$ How bad is your cough? '1 3 2 2 2 2 2 2 3  '$ How bad is your fatigue '1 2 2 3 2 '$ 0 '3 1 2  '$ How bad is nausea  0 0 3 0 0 1 0 0  How bad is vomiting?   0 0 0 0 0 1 0 0  How bad is diarrhea?  00 1 0 0 0 1 0 0  How bad is anxiety?  0 0 0 00 0 2 0 00  How bad is depression  0 '2 1 1 '$ 0 1 0 0  00 0 0 Simple office walk 185 feet x  3 laps goal with forehead probe 04/13/2018  08/16/2018  12/15/2018  05/09/2019  08/22/2019  01/30/2020  08/29/2020  01/14/2021  07/08/2021  08/14/2021  10/03/2021  07/21/2022  10/27/2022   O2 used Room air Room air Room air Room air Room air Room air ra ra ra ra ra ra ra  Number laps completed '3 3 3 3 2 '$ stopped at 2 die to hip pain 3 laps - no hip pain following hip surgery '3 3 3 '$ attempted byt did  only 2 3 bu stopped at 2 3 and did all 3 3 Ddi onl 2 las  Comments about pace good Moderate pace Normal, hip bothering     avg pace avg  avg avg avg  Resting Pulse Ox/HR 98% and 73/min 98% and HR 77/min 99% and HR 61/min 98% and HR 70/min 98% 98% and 75/min 97% and 67/mi 94% and 80/min 96% and HR 72 98% and 71 100% and HR 82 98% ad HR 72 100% and HR 79  Final Pulse Ox/HR 91% and 91/min 93% and 92.min 94% and 92/min 93% and 98/min 91%  89% and 93/,imn 88% and 94 89% ad 88.nin 88% at 2nd  lap end HR 92 86% and 96 89% and HR 91 93% and HR 87 84% and HR 104  Desaturated </= 88% no no no no   yes almost yes yes almst no yes  Desaturated <= 3% points yes yes Yes, 5 points Yes, 5 points    Yes, 5 points Yes, 8 points Yes, 12 pints Yes 11 pot 5 pots 16 pont  Got Tachycardic >/= 90/min yes yes yes yes     yes yes yes no   Symptoms at end of test none none Hip pain and very mild dyspnea  Stopped due to hip pain Moderate duyspnea with mask Mild to moderate dyspnea  Severe dyspnea Mild dyspnea Severe dyspnea Very mild dyspneax moderate  Miscellaneous comments x x    No hip pain     Similar to lastime Symptoms out of proportion ? better worse     Modified Six Minute Walk - 12/10/22 1600     Type of O2 used  Room Air    Number of laps completed  3    Lap Pace Brisk    Resting Heartrate 74 bpm    Final Heartrate 98 bpm    Resting Pulse Ox 99 %    Desaturated to <= 3 points Yes    Desaturated to < 88% No    Became tachycardic No    Symptoms  N/A    Was the O2 correction test done? No              PFT     Latest Ref Rng & Units 12/09/2022   11:48 AM 07/21/2022    9:50 AM 01/06/2022    9:51 AM 10/03/2021    8:59 AM 01/14/2021    1:51 PM 11/06/2020   11:03 AM 07/18/2020   10:53 AM  PFT Results  FVC-Pre L 1.53  1.54  1.58  1.48  1.49  1.41  1.47   FVC-Predicted Pre % 66  67  67  63  62  59  62   Pre FEV1/FVC % % 87  91  90  92  92  93  88   FEV1-Pre L 1.32  1.40  1.43  1.37  1.37  1.31  1.30   FEV1-Predicted Pre % 77  81  81  78  77  73  73   DLCO uncorrected ml/min/mmHg 9.64  8.16  8.62  8.92  11.26  9.70  11.19   DLCO UNC% % 59  50  53  55  69  59  68   DLCO corrected ml/min/mmHg 9.41  7.86  8.62  8.92  11.26  10.53  11.19   DLCO COR %Predicted % 58  48  53  55  69  64  68   DLVA Predicted % 81  64  75  78  92  89  90        has a past medical history of Allergy, Arthritis, Asthma, Cataract, Coronary artery calcification seen on CAT scan (08/19/2017), DDD (degenerative disc disease), thoracic, Depression, DOE (dyspnea on exertion), Fatty liver, GERD (gastroesophageal reflux disease), H/O steroid therapy, Heart palpitations (92/09/9416), Helicobacter pylori ab+, Hemorrhoids, Hiatal hernia, High cholesterol, History of chronic bronchitis, History of migraine, History of MRSA infection, Hyperlipidemia, mixed (08/19/2017), Hyperplastic colon polyp (2007), IBS (irritable bowel syndrome), Inguinal hernia, Insulin resistance, Interstitial lung disease ([place]), MVP (mitral valve prolapse), Pneumonia, Pneumonitis, hypersensitivity (HCC), PONV (postoperative nausea  and vomiting), Pre-diabetes, Pulmonary fibrosis (HCC), PVC (premature ventricular contraction) (08/19/2017), Rapid heart rate, Squamous cell carcinoma of skin, Tinnitus, right ear, and Vocal cord ulcer.   reports that she has never smoked. She has never used smokeless tobacco.  Past Surgical History:  Procedure Laterality Date   6 HOUR [place] STUDY N/A 02/21/2018   Procedure: 24 HOUR PH STUDY;  Surgeon: Mauri Pole, MD;  [facility];  Service: Endoscopy;  Laterality: N/A;   BREAST BIOPSY Right 2009   BIOPSY, pt denies   BREAST BIOPSY Left 2003   Benign    CATARACT EXTRACTION Left    CESAREAN SECTION     COLONOSCOPY     ESOPHAGEAL MANOMETRY N/A 02/21/2018   Procedure: ESOPHAGEAL MANOMETRY (EM);  Surgeon: Mauri Pole, MD;  [facility];  Service: Endoscopy;  Laterality: N/A;   FOOT FRACTURE SURGERY  2006 or 2007   right   HYMENECTOMY     LUNG BIOPSY  09/28/2012   Procedure: LUNG BIOPSY;  Surgeon: Melrose Nakayama, MD;  Location: [place];  Service: Thoracic;  Laterality: N/A;  lung biopsies tims three   SQUAMOUS CELL CARCINOMA EXCISION Left    left arm   TOTAL HIP ARTHROPLASTY Right 09/10/2015   Procedure: RIGHT TOTAL HIP ARTHROPLASTY ANTERIOR APPROACH;  Surgeon: Paralee Cancel, MD;  [facility];  Service: Orthopedics;  Laterality: Right;   TOTAL HIP ARTHROPLASTY Left 10/03/2019   Procedure: TOTAL HIP ARTHROPLASTY ANTERIOR APPROACH;  Surgeon: Paralee Cancel, MD;  [facility];  Service: Orthopedics;  Laterality: Left;  70 mins   TUBAL LIGATION     UPPER GI ENDOSCOPY     VIDEO ASSISTED THORACOSCOPY  09/28/2012   Procedure: VIDEO ASSISTED THORACOSCOPY;  Surgeon: Melrose Nakayama, MD;  Location: [place];  Service: Thoracic;  Laterality: Right;   VIDEO BRONCHOSCOPY  11/19/2011   Procedure: VIDEO BRONCHOSCOPY WITH FLUORO;  Surgeon: Brand Males, MD;  [facility];  Service: Endoscopy;;    Allergies  Allergen Reactions   Atorvastatin  Other (See Comments)    Leg pain   Betadine [Povidone Iodine] Other (See Comments)    blisters   Codeine Nausea And Vomiting   Garlic Diarrhea   Hydrocodone Nausea And Vomiting   Macrobid [Nitrofurantoin] Other (  See Comments)   Ofev [Nintedanib] Other (See Comments)    Abdominal pain   Onion Diarrhea   Other Other (See Comments)   Rosuvastatin Other (See Comments)    Leg pain   Shellfish Allergy Nausea And Vomiting   Sulfa Antibiotics    Sulfonamide Derivatives Other (See Comments)    headaches    Immunization History  Administered Date(s) Administered   Fluad Quad(high Dose 65+) 07/17/2019   Hepatitis A 05/23/2010   Hepatitis B 06/23/2006   Influenza Split 08/11/2012, 08/14/2017   Influenza Whole 09/08/2011   Influenza, High Dose Seasonal PF 08/24/2016, 07/24/2017, 08/31/2018, 08/15/2020, 09/26/2020, 09/12/2021, 08/24/2022   Influenza,inj,Quad PF,6+ Mos 09/07/2013   Influenza-Unspecified 09/26/2014, 07/27/2015   MMR 05/23/2010   Moderna Sars-Covid-2 Vaccination 07/30/2021, 01/01/2022   PFIZER(Purple Top)SARS-COV-2 Vaccination 12/16/2019, 01/08/2020, 07/18/2020, 08/11/2022   Pneumococcal Conjugate-13 03/15/2014   Pneumococcal Polysaccharide-23 01/11/2018, 09/26/2020, 08/26/2021   Respiratory Syncytial Virus Vaccine,Recomb Aduvanted(Arexvy) 07/28/2022   Td 02/22/2003   Tdap 10/28/2012   Zoster Recombinat (Shingrix) 02/15/2017, 05/17/2017   Zoster, Live 12/04/2010    Family History  Problem Relation Age of Onset   Asthma Mother    Osteoarthritis Mother    Dementia Mother 50   Lymphoma Father    Diabetes Father    Hypertension Sister    Heart disease Sister    Kidney disease Sister    Allergic rhinitis Daughter    Ovarian cancer Maternal Aunt    Breast cancer Paternal Aunt    Breast cancer Paternal Aunt    Emphysema Maternal Grandmother    Asthma Maternal Grandmother    Lung disease Maternal Grandfather    Bone cancer Paternal Grandfather    Breast cancer  Cousin    Breast cancer Cousin    Breast cancer Cousin    Colon cancer Neg Hx    Angioedema Neg Hx    Eczema Neg Hx    Immunodeficiency Neg Hx    Urticaria Neg Hx      Current Outpatient Medications:    acetaminophen (TYLENOL) 500 MG tablet, Tylenol Extra Strength, Disp: , Rfl:    albuterol (PROAIR HFA) 108 (90 Base) MCG/ACT inhaler, Inhale 2 puffs into the lungs every 4 (four) hours as needed for wheezing. Or coughing spells.  You may use 2 Puffs 5-10 minutes before exercise., Disp: 1 Inhaler, Rfl: 3   Alpha-D-Galactosidase (BEANO PO), Take 1 tablet by mouth as needed (if eating onion or garlic). , Disp: , Rfl:    Ascorbic Acid (VITAMIN C) 1000 MG tablet, Take 1,000 mg by mouth daily., Disp: , Rfl:    ASHWAGANDHA PO, Take 2 capsules by mouth daily., Disp: , Rfl:    azelastine (ASTELIN) 0.1 % nasal spray, Place 1 spray into both nostrils at bedtime., Disp: , Rfl:    chlorpheniramine (CHLOR-TRIMETON) 4 MG tablet, Take 2 mg by mouth 2 (two) times daily. Takes half tablet twice daily, Disp: , Rfl:    dicyclomine (BENTYL) 10 MG capsule, Take 1 capsule by mouth daily as needed., Disp: , Rfl:    diltiazem (CARDIZEM CD) 120 MG 24 hr capsule, Take 1 capsule (120 mg total) by mouth daily., Disp: 90 capsule, Rfl: 3   diphenhydrAMINE-PE-APAP (DELSYM COUGH/COLD NIGHT TIME) 12.5-5-325 MG/10ML LIQD, Take by mouth as needed., Disp: , Rfl:    doxycycline (VIBRA-TABS) 100 MG tablet, Take 1 tablet (100 mg total) by mouth 2 (two) times daily., Disp: 10 tablet, Rfl: 0   EPINEPHrine 0.3 mg/0.3 mL IJ SOAJ injection, Inject into the muscle., Disp: ,  Rfl:    ezetimibe (ZETIA) 10 MG tablet, Take 1 tablet (10 mg total) by mouth daily. Please keep upcoming appt for future refills. Thank you., Disp: 90 tablet, Rfl: 3   fluticasone (FLONASE) 50 MCG/ACT nasal spray, Spray 2 sprays into each nostril every day., Disp: 48 mL, Rfl: 1   fluticasone furoate-vilanterol (BREO ELLIPTA) 100-25 MCG/ACT AEPB, Inhale 1 puff into the  lungs daily., Disp: 180 each, Rfl: 3   lactase (LACTAID) 3000 units tablet, Take by mouth., Disp: , Rfl:    levocetirizine (XYZAL) 5 MG tablet, Take 5 mg by mouth at bedtime., Disp: , Rfl:    metoprolol succinate (TOPROL XL) 25 MG 24 hr tablet, Take 1 tablet (25 mg total) by mouth daily. (Patient taking differently: Take 12.5 mg by mouth daily.), Disp: 90 tablet, Rfl: 1   montelukast (SINGULAIR) 10 MG tablet, TAKE 1 TABLET BY MOUTH EVERYDAY AT BEDTIME, Disp: 90 tablet, Rfl: 2   Multiple Vitamin (MULTIVITAMIN) tablet, Take 1 tablet by mouth daily., Disp: , Rfl:    nystatin-triamcinolone (MYCOLOG II) cream, nystatin-triamcinolone 100,000 unit/g-0.1 % topical cream, Disp: , Rfl:    omeprazole (PRILOSEC OTC) 20 MG tablet, Take 20 mg by mouth daily., Disp: , Rfl:    OXYGEN, Inhale into the lungs. 2 to 3 liters as needed during the day and nite time use on/off, Disp: , Rfl:    OZEMPIC, 0.25 OR 0.5 MG/DOSE, 2 MG/1.5ML SOPN, Inject into the skin., Disp: , Rfl:    pravastatin (PRAVACHOL) 40 MG tablet, Take 1 tablet (40 mg total) by mouth every evening., Disp: 90 tablet, Rfl: 1   predniSONE (DELTASONE) 5 MG tablet, Take 1 tablet (5 mg total) by mouth daily with breakfast., Disp: 90 tablet, Rfl: 3   Sodium Chloride, Inhalant, 7 % NEBU, Use per nebulizer once a day to help clear sputum, Disp: 120 mL, Rfl: 3   nitroGLYCERIN (NITROSTAT) 0.4 MG SL tablet, Place 1 tablet (0.4 mg total) under the tongue every 5 (five) minutes as needed for chest pain., Disp: 25 tablet, Rfl: 12      Objective:   Vitals:   12/10/22 1528  BP: 124/78  Pulse: 81  Temp: (!) 97.2 F (36.2 C)  TempSrc: Oral  SpO2: 96%  Weight: 141 lb 9.6 oz (64.2 kg)  Height: 5' (1.524 m)    Estimated body mass index is 27.65 kg/m as calculated from the following:   Height as of this encounter: 5' (1.524 m).   Weight as of this encounter: 141 lb 9.6 oz (64.2 kg).  '@WEIGHTCHANGE'$ @  [employer]   12/10/22 1528  Weight: 141 lb 9.6 oz  (64.2 kg)     Physical Exam    General: No distress. Looks well Neuro: Alert and Oriented x 3. GCS 15. Speech normal Psych: Pleasant Resp:  Barrel Chest - no.  Wheeze - no, Crackles - yes, No overt respiratory distress CVS: Normal heart sounds. Murmurs - no Ext: Stigmata of Connective Tissue Disease - no HEENT: Normal upper airway. PEERL +. No post nasal drip        Assessment:       ICD-10-CM   1. Hypersensitivity pneumonitis ([place])  J67.9     2. ILD (interstitial lung disease) ([place])  J84.9     3. Hiatal hernia  K44.9     4. Current chronic use of systemic steroids  Z79.52          Plan:     Patient Instructions     ICD-10-CM  1. Hypersensitivity pneumonitis (HCC)  J67.9     2. ILD (interstitial lung disease) (HCC)  J84.9         ILD is stable. Seems inflmmatory component is making you require prednisone (more in winter)  Plan  - unable to document eosinophilia to see if you qualify for dupixent - encourage conversation about allergy shot with Dr Remus Blake -- continue prednisone '10mg'$  per day  - can adjus to 5-7.'5mg'$  per day in spring 2024 -continue to  control Acid reflux well  Followup  - spirometry/dlco in 4-6 months - 30 min visit in4-6 months with DR Ysabella Babiarz   Moderate Complexity MDM NEW OFFICE  The table below is from the 2021 E/M guidelines, first released in 2021, with minor revisions added in 2023. Must meet the requirements for 2 out of 3 dimensions to qualify.    Number and complexity of problems addressed Amount and/or complexity of data reviewed Risk of complications and/or morbidity  One or more chronic illness with mild exacerbation, progression, or side effects of treatment  Two or more stable chronic illnesses  One undiagnosed new problem with uncertain prognosis  One acute illness with systemic symptoms   Acute complicated injury Must meet the requirements for 1 of 3 of the categories)  Category 1: Tests and documents,  historian  Any combination of 3 of the following:  Assessment requiring an independent historian  Review of prior external records  Review of results of each unique test  Ordering of each unique test    Category 2: Interpretation of tests  Independent interpretation of a test perfromed by another physician/NPP  Category 3: Discuss management/tests  Discussion of magagement or tests with an external physician/NPP Prescription drug management  Decision regarding minor surgery with identfied patient or procedure risk factors  Decision regarding elective major surgery without identified patient or procedure risk factors  Diagnosis or treatment significantly limited by social determinants of health                SIGNATURE    Dr. Brand Males, M.D., F.C.C.P,  Pulmonary and Critical Care Medicine Staff Physician, [place] Director - Interstitial Lung Disease  Program  [facility] [place], [place], [address]  Pager: [phone], If no answer or between  15:00h - 7:00h: call [phone] Telephone: [phone]  5:20 PM 12/10/2022

## 2022-12-10 NOTE — Patient Instructions (Addendum)
ICD-10-CM   1. Hypersensitivity pneumonitis ([place])  J67.9     2. ILD (interstitial lung disease) ([place])  J84.9     3. Hiatal hernia  K44.9     4. Current chronic use of systemic steroids  Z79.52         ILD is stable. Seems inflmmatory component is making you require prednisone (more in winter)  Plan  - unable to document eosinophilia to see if you qualify for dupixent - encourage conversation about allergy shot with Dr Remus Blake -- continue prednisone '10mg'$  per day  - can adjus to 5-7.'5mg'$  per day in spring 2024 -continue to  control Acid reflux well  Followup  - spirometry/dlco in 4-6 months - 30 min visit in4-6 months with DR Chase Caller

## 2022-12-21 NOTE — Progress Notes (Unsigned)
Cardiology Office Note   Date:  12/22/2022   ID:  Rhonda [name], DOB 07/08/1951, MRN [id number]  PCP:  Hayden Rasmussen, MD  Cardiologist:   Minus Breeding, MD   Chief Complaint  Patient presents with   Shortness of Breath      History of Present Illness: Rhonda [name] is a 72 y.o. female who presents for follow up of coronary calcium and PVCs.  She had a normal nuclear stress test in 2020.  She has premature ventricular contractions.  She had a normal echo in   She has chronic interstitial lung disease.  She does have home O2.  Her calcium score was 343.    She had a normal echo in Nov 2022.    She returns for follow up.  At the last visit she had some shortness of breath but as the seasons change then she has been treated by Dr. Chase Caller she is felt better with her breathing.  She is not taking care of a 56-week-old grandchild most of the day she goes up and down stairs.  She is wearing her oxygen only as needed.  She denies any chest pressure, neck or arm discomfort.  She has had no palpitations, presyncope or syncope.  She has had no weight gain or edema.   Past Medical History:  Diagnosis Date   Allergy    Arthritis    history spinal stenosis. osteoarthritis right hip   Asthma    Cataract    Coronary artery calcification seen on CAT scan 08/19/2017   >300 on CT scan 08/2017   DDD (degenerative disc disease), thoracic    Depression    DOE (dyspnea on exertion)    a. 04/2010 Lexi MV EF 71%, no ischemia/infarct;     Fatty liver    GERD (gastroesophageal reflux disease)    H/O steroid therapy    Steroid use orally over 4 yrs- for Lung Fibrosis   Heart palpitations 44/62/8638   Helicobacter pylori ab+    Hemorrhoids    Hiatal hernia    High cholesterol    History of chronic bronchitis    as child   History of migraine    History of MRSA infection    Hyperlipidemia, mixed 08/19/2017   Hyperplastic colon polyp 2007   IBS (irritable bowel syndrome)     Inguinal hernia    right   Insulin resistance    past   Interstitial lung disease (HCC)    MVP (mitral valve prolapse)    Posterior mitral valve leaflet with mild MR   Pneumonia    Pneumonitis, hypersensitivity ([place])    a. 09/2012 s/p Bx - ? 2/2 bird, mold, oil paint exposure ->on steroids, followed by pulm.   PONV (postoperative nausea and vomiting)    Pre-diabetes    takes Metformin   Pulmonary fibrosis (HCC)    Dr. Chase Caller follows- stable at present   PVC (premature ventricular contraction) 08/19/2017   Rapid heart rate    Dr. Radford Pax follows- last visit Epic note 9'16   Squamous cell carcinoma of skin    Tinnitus, right ear    Vocal cord ulcer     Past Surgical History:  Procedure Laterality Date   28 HOUR [place] STUDY N/A 02/21/2018   Procedure: [place];  Surgeon: Mauri Pole, MD;  [facility];  Service: Endoscopy;  Laterality: N/A;   BREAST BIOPSY Right 2009   BIOPSY, pt denies   BREAST BIOPSY Left  2003   Benign    CATARACT EXTRACTION Left    CESAREAN SECTION     COLONOSCOPY     ESOPHAGEAL MANOMETRY N/A 02/21/2018   Procedure: ESOPHAGEAL MANOMETRY (EM);  Surgeon: Mauri Pole, MD;  [facility];  Service: Endoscopy;  Laterality: N/A;   FOOT FRACTURE SURGERY  2006 or 2007   right   HYMENECTOMY     LUNG BIOPSY  09/28/2012   Procedure: LUNG BIOPSY;  Surgeon: Melrose Nakayama, MD;  Location: [place];  Service: Thoracic;  Laterality: N/A;  lung biopsies tims three   SQUAMOUS CELL CARCINOMA EXCISION Left    left arm   TOTAL HIP ARTHROPLASTY Right 09/10/2015   Procedure: RIGHT TOTAL HIP ARTHROPLASTY ANTERIOR APPROACH;  Surgeon: Paralee Cancel, MD;  [facility];  Service: Orthopedics;  Laterality: Right;   TOTAL HIP ARTHROPLASTY Left 10/03/2019   Procedure: TOTAL HIP ARTHROPLASTY ANTERIOR APPROACH;  Surgeon: Paralee Cancel, MD;  [facility];  Service: Orthopedics;  Laterality: Left;  70 mins   TUBAL LIGATION     UPPER GI  ENDOSCOPY     VIDEO ASSISTED THORACOSCOPY  09/28/2012   Procedure: VIDEO ASSISTED THORACOSCOPY;  Surgeon: Melrose Nakayama, MD;  Location: [place];  Service: Thoracic;  Laterality: Right;   VIDEO BRONCHOSCOPY  11/19/2011   Procedure: VIDEO BRONCHOSCOPY WITH FLUORO;  Surgeon: Brand Males, MD;  [facility];  Service: Endoscopy;;     Current Outpatient Medications  Medication Sig Dispense Refill   acetaminophen (TYLENOL) 500 MG tablet Tylenol Extra Strength     albuterol (PROAIR HFA) 108 (90 Base) MCG/ACT inhaler Inhale 2 puffs into the lungs every 4 (four) hours as needed for wheezing. Or coughing spells.  You may use 2 Puffs 5-10 minutes before exercise. 1 Inhaler 3   Alpha-D-Galactosidase (BEANO PO) Take 1 tablet by mouth as needed (if eating onion or garlic).      Ascorbic Acid (VITAMIN C) 1000 MG tablet Take 1,000 mg by mouth daily.     ASHWAGANDHA PO Take 2 capsules by mouth daily.     azelastine (ASTELIN) 0.1 % nasal spray Place 1 spray into both nostrils at bedtime.     chlorpheniramine (CHLOR-TRIMETON) 4 MG tablet Take 2 mg by mouth 2 (two) times daily. Takes half tablet twice daily     dicyclomine (BENTYL) 10 MG capsule Take 1 capsule by mouth daily as needed.     diltiazem (CARDIZEM CD) 120 MG 24 hr capsule Take 1 capsule (120 mg total) by mouth daily. 90 capsule 3   diphenhydrAMINE-PE-APAP (DELSYM COUGH/COLD NIGHT TIME) 12.5-5-325 MG/10ML LIQD Take by mouth as needed.     doxycycline (VIBRA-TABS) 100 MG tablet Take 1 tablet (100 mg total) by mouth 2 (two) times daily. 10 tablet 0   EPINEPHrine 0.3 mg/0.3 mL IJ SOAJ injection Inject into the muscle.     ezetimibe (ZETIA) 10 MG tablet Take 1 tablet (10 mg total) by mouth daily. Please keep upcoming appt for future refills. Thank you. 90 tablet 3   fluticasone (FLONASE) 50 MCG/ACT nasal spray Spray 2 sprays into each nostril every day. 48 mL 1   fluticasone furoate-vilanterol (BREO ELLIPTA) 100-25 MCG/ACT AEPB Inhale 1  puff into the lungs daily. 180 each 3   lactase (LACTAID) 3000 units tablet Take by mouth.     levocetirizine (XYZAL) 5 MG tablet Take 5 mg by mouth at bedtime.     metoprolol succinate (TOPROL XL) 25 MG 24 hr tablet Take 1 tablet (25  mg total) by mouth daily. (Patient taking differently: Take 12.5 mg by mouth daily.) 90 tablet 1   montelukast (SINGULAIR) 10 MG tablet TAKE 1 TABLET BY MOUTH EVERYDAY AT BEDTIME 90 tablet 2   Multiple Vitamin (MULTIVITAMIN) tablet Take 1 tablet by mouth daily.     nystatin-triamcinolone (MYCOLOG II) cream nystatin-triamcinolone 100,000 unit/g-0.1 % topical cream     omeprazole (PRILOSEC OTC) 20 MG tablet Take 20 mg by mouth daily.     OXYGEN Inhale into the lungs. 2 to 3 liters as needed during the day and nite time use on/off     OZEMPIC, 0.25 OR 0.5 MG/DOSE, 2 MG/1.5ML SOPN Inject into the skin.     pravastatin (PRAVACHOL) 40 MG tablet Take 1 tablet (40 mg total) by mouth every evening. 90 tablet 1   predniSONE (DELTASONE) 5 MG tablet Take 1 tablet (5 mg total) by mouth daily with breakfast. 90 tablet 3   Sodium Chloride, Inhalant, 7 % NEBU Use per nebulizer once a day to help clear sputum 120 mL 3   nitroGLYCERIN (NITROSTAT) 0.4 MG SL tablet Place 1 tablet (0.4 mg total) under the tongue every 5 (five) minutes as needed for chest pain. 25 tablet 12   No current facility-administered medications for this visit.    Allergies:   Atorvastatin, Betadine [povidone iodine], Codeine, Garlic, Hydrocodone, Macrobid [nitrofurantoin], Ofev [nintedanib], Onion, Other, Rosuvastatin, Shellfish allergy, Sulfa antibiotics, and Sulfonamide derivatives    ROS:  Please see the history of present illness.   Otherwise, review of systems are positive for none.   All other systems are reviewed and negative.    PHYSICAL EXAM: VS:  BP 128/74 (BP Location: Left Arm, Patient Position: Sitting, Cuff Size: Normal)   Pulse 87   Ht 5' (1.524 m)   Wt 141 lb 9.6 oz (64.2 kg)   SpO2 95%    BMI 27.65 kg/m  , BMI Body mass index is 27.65 kg/m. GENERAL:  Well appearing NECK:  No jugular venous distention, waveform within normal limits, carotid upstroke brisk and symmetric, no bruits, no thyromegaly LUNGS:  Clear to auscultation bilaterally CHEST: Fine diffuse crackles HEART:  PMI not displaced or sustained,S1 and S2 within normal limits, no S3, no S4, no clicks, no rubs, no murmurs ABD:  Flat, positive bowel sounds normal in frequency in pitch, no bruits, no rebound, no guarding, no midline pulsatile mass, no hepatomegaly, no splenomegaly EXT:  2 plus pulses throughout, no edema, no cyanosis no clubbing   EKG:  EKG is not ordered today.   Recent Labs: 10/27/2022: BUN 21; Creatinine, Ser 1.08; Hemoglobin 14.2; Platelets 261.0; Potassium 4.6; Pro B Natriuretic peptide (BNP) 32.0; Sodium 137    Lipid Panel    Component Value Date/Time   CHOL 177 12/08/2018 0917   TRIG 131 12/08/2018 0917   HDL 68 12/08/2018 0917   CHOLHDL 2.6 12/08/2018 0917   CHOLHDL 3.5 02/26/2015 1540   VLDL 37 02/26/2015 1540   LDLCALC 83 12/08/2018 0917      Wt Readings from Last 3 Encounters:  12/22/22 141 lb 9.6 oz (64.2 kg)  12/10/22 141 lb 9.6 oz (64.2 kg)  10/27/22 139 lb 3.2 oz (63.1 kg)      Other studies Reviewed: Additional studies/ records that were reviewed today include: Labs raise Review of the above records demonstrates:  Please see elsewhere in the note.     ASSESSMENT AND PLAN:  Coronary artery calcifications on CT:    Her breathing is improved.  She has  had no change in her symptoms compared to 2020 when she had a negative perfusion study.  She will continue with aggressive risk reduction.    Palpitations: She has PACs.  These are improved on the Cardizem.  She is not having any new symptoms.  No change in therapy.  Dyslipidemia:    LDL was 83 but the HDL was 68.  No change in therapy.   Current medicines are reviewed at length with the patient today.  The patient  does not have concerns regarding medicines.  The following changes have been made:  None  Labs/ tests ordered today include:  None  No orders of the defined types were placed in this encounter.   Disposition:   FU with me in 12 months.     Signed, Minus Breeding, MD  12/22/2022 4:56 PM    [facility]

## 2022-12-22 ENCOUNTER — Ambulatory Visit: Payer: Medicare PPO | Attending: Cardiology | Admitting: Cardiology

## 2022-12-22 ENCOUNTER — Other Ambulatory Visit: Payer: Self-pay | Admitting: Internal Medicine

## 2022-12-22 ENCOUNTER — Encounter: Payer: Self-pay | Admitting: Cardiology

## 2022-12-22 VITALS — BP 128/74 | HR 87 | Ht 60.0 in | Wt 141.6 lb

## 2022-12-22 DIAGNOSIS — R931 Abnormal findings on diagnostic imaging of heart and coronary circulation: Secondary | ICD-10-CM | POA: Diagnosis not present

## 2022-12-22 DIAGNOSIS — E785 Hyperlipidemia, unspecified: Secondary | ICD-10-CM | POA: Diagnosis not present

## 2022-12-22 DIAGNOSIS — R002 Palpitations: Secondary | ICD-10-CM | POA: Diagnosis not present

## 2022-12-22 NOTE — Patient Instructions (Signed)
   Follow-Up: At [facility], you and your health needs are our priority.  As part of our continuing mission to provide you with exceptional heart care, we have created designated Provider Care Teams.  These Care Teams include your primary Cardiologist (physician) and Advanced Practice Providers (APPs -  Physician Assistants and Nurse Practitioners) who all work together to provide you with the care you need, when you need it.  We recommend signing up for the patient portal called "MyChart".  Sign up information is provided on this After Visit Summary.  MyChart is used to connect with patients for Virtual Visits (Telemedicine).  Patients are able to view lab/test results, encounter notes, upcoming appointments, etc.  Non-urgent messages can be sent to your provider as well.   To learn more about what you can do with MyChart, go to [email or web address].    Your next appointment:   12 month(s)  Provider:   James Hochrein, MD      

## 2022-12-29 ENCOUNTER — Other Ambulatory Visit: Payer: Self-pay | Admitting: Internal Medicine

## 2023-01-18 ENCOUNTER — Encounter: Payer: Self-pay | Admitting: Internal Medicine

## 2023-01-18 ENCOUNTER — Telehealth: Payer: Self-pay | Admitting: Adult Health

## 2023-01-18 ENCOUNTER — Telehealth: Payer: Self-pay | Admitting: Internal Medicine

## 2023-01-18 MED ORDER — PREDNISONE 10 MG PO TABS: ORAL_TABLET | ORAL | 0 refills | Status: AC

## 2023-01-18 NOTE — Telephone Encounter (Signed)
ILD PT w/flair up. SOB and coughing. Made appt w/ Ms. Parrett but would like to see Dr. Alfonso Patten if she can. Pls call to advise @ [phone]

## 2023-01-18 NOTE — Telephone Encounter (Signed)
Dr. Chase Caller please advise on the following My Chart message:  Rhonda [name] "[name]"  P [facility] (supporting Brand Males, MD)10 minutes ago (1:56 PM)    I have been texting Dr R through [place]. He wants me to have a pred burst again and then go down to 10 mg and stay there. If someone can get that as a prescription to [facility] I will be able to cancel my appointment with Tammy Perrit for Wednesday. Thanks for the help.   Thank you

## 2023-01-18 NOTE — Telephone Encounter (Signed)
[employer].   Pred RX tablets and directions do not match. Please correct and resend.

## 2023-01-18 NOTE — Telephone Encounter (Signed)
Pls call in Please take Take prednisone '40mg'$  once daily x 4 days, then '30mg'$  once daily x 4 days, then '20mg'$  once daily x 4 days, then prednisone '10mg'$  once daily to continue  Keep appt with Tammy coming up

## 2023-01-18 NOTE — Telephone Encounter (Signed)
Called and spoke with patient. Advised patient that Dr. Chase Caller did not have any openings available when she wanted the appointment. Patient stated she would keep her appointment with tammy on Wednesday but also stated that if Dr. Chase Caller handles her situation over the phone she will cancel her appointment with tammy on Wednesday.   Nothing further needed.

## 2023-01-18 NOTE — Telephone Encounter (Signed)
Prednisone order placed. Nothing further needed at this time.

## 2023-01-19 ENCOUNTER — Telehealth: Payer: Self-pay | Admitting: Internal Medicine

## 2023-01-19 NOTE — Telephone Encounter (Signed)
See pt e-mail

## 2023-01-19 NOTE — Telephone Encounter (Signed)
[place] and spoke with Nira Conn and informed her the amount was #36 instead of #20. Heather verbalized understanding. Nothing further needed.

## 2023-01-19 NOTE — Telephone Encounter (Signed)
Please call pharmacy back the proscription is not written for the right amount

## 2023-01-20 ENCOUNTER — Ambulatory Visit: Payer: Medicare PPO | Admitting: Adult Health

## 2023-01-20 ENCOUNTER — Ambulatory Visit (INDEPENDENT_AMBULATORY_CARE_PROVIDER_SITE_OTHER): Payer: Medicare PPO

## 2023-01-20 ENCOUNTER — Telehealth: Payer: Self-pay | Admitting: Adult Health

## 2023-01-20 ENCOUNTER — Encounter: Payer: Self-pay | Admitting: Adult Health

## 2023-01-20 ENCOUNTER — Encounter: Payer: Self-pay | Admitting: Internal Medicine

## 2023-01-20 VITALS — BP 120/50 | HR 80 | Temp 97.9°F | Ht 60.0 in | Wt 138.6 lb

## 2023-01-20 DIAGNOSIS — J849 Interstitial pulmonary disease, unspecified: Secondary | ICD-10-CM | POA: Diagnosis not present

## 2023-01-20 DIAGNOSIS — J679 Hypersensitivity pneumonitis due to unspecified organic dust: Secondary | ICD-10-CM

## 2023-01-20 DIAGNOSIS — J9611 Chronic respiratory failure with hypoxia: Secondary | ICD-10-CM

## 2023-01-20 MED ORDER — AMOXICILLIN-POT CLAVULANATE 875-125 MG PO TABS: 1.0000 | ORAL_TABLET | Freq: Two times a day (BID) | ORAL | 0 refills | Status: DC

## 2023-01-20 NOTE — Progress Notes (Signed)
Walked on POC, did not tolerate POC on 5L.  Orders sent to [facility] for home concentrator and best fit portable oxygen.

## 2023-01-20 NOTE — Assessment & Plan Note (Signed)
Continue on oxygen to maintain O2 saturations greater than 88 to 90%.  Recent increased oxygen demands requiring continuous flow oxygen.  Will need to add a concentrator at home with continuous flow as patient only has a POC device that goes up to 3 L pulsed.   Plan  Patient Instructions  Sputum culture Augmentin '875mg'$  Twice daily  for 1 week with food. Taper prednisone as planned, hold at '10mg'$  daily  Continue on Oxygen up to 6l/m to keep O2 sats >88-90%.  Set up HRCT chest for ILD.  Follow up in 3-4 weeks with Dr. Chase Caller and As needed   Please contact office for sooner follow up if symptoms do not improve or worsen or seek emergency care

## 2023-01-20 NOTE — Progress Notes (Signed)
$'@Patient'T$  ID: Rhonda [name], female    DOB: Mar 24, 1951, 72 y.o.   MRN: [id number]  Chief Complaint  Patient presents with   Acute Visit    Referring provider: Hayden Rasmussen, MD  HPI: 72 year old female followed for interstitial lung disease-chronic hypersensitivity pneumonitis (VATS confirmed) (possible irritants bird exposure and oil painting ) maintained on daily steroids, chronic respiratory failure on oxygen with activity  TEST/EVENTS :  ILD workup :   Hypersensitivity Pneumonitis and ILD             - Potential etiologies - cockateil x 2 for 18 years through end 2012. 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 Denies mold but lives in house built in 1936 with a "weird baselment: and has humidifier             - first noted on CT chest 10/15/09 following trip to [place] (PE negative) but not described in 2003 CT chest report             - autoimmune panel: 10/23/11: Negative             -  U[place]ennt bronch 11/19/11  - non diagnostic             - VATS Nov 2013- CRONIC HYPERSENSITIVITY PNEUMONITIS WITH GRANULOMA. However, there is worrying trend of UIP pattern in the Upper lobes.   Previous [facility] transplant evaluation (felt too early ).  Unable to tolerate Esbriet /OFEV 2022  Rx  01/20/2023 Acute OV : ILD -chronic hypersensitivity pneumonitis and chronic respiratory failure Patient complains over the last 6 months that she has been having waxing and waning of symptoms with increased cough shortness of breath and decreased activity tolerance.  She has been treated with 3 steroid boost.  She says each time she has gotten much better and actually felt the best that she has felt in a long time.  But within a couple weeks of returning to prednisone '5mg'$  daily her symptoms started to return.  Most recently started noticing that her breathing was not doing as good 3 weeks ago.  Started to have more cough, congestion  thick mucus decreased activity tolerance and increased oxygen demands.  Today in the office walk test shows patient is unable to maintain O2 saturations on pulsed oxygen.  Requires continuous flow oxygen. Patient was started on a prednisone burst yesterday at 40 mg to be tapered by 10 mg every 4 days.  She does feel like she is some better since starting on the higher dose of prednisone yesterday.  Chest x-ray today shows chronic ILD changes.  No acute process. She denies any fever, chest, orthopnea, edema, calf pain.    Allergies  Allergen Reactions   Atorvastatin Other (See Comments)    Leg pain   Betadine [Povidone Iodine] Other (See Comments)    blisters   Codeine Nausea And Vomiting   Garlic Diarrhea   Hydrocodone Nausea And Vomiting   Macrobid [Nitrofurantoin] Other (See Comments)   Ofev [Nintedanib] Other (See Comments)    Abdominal pain   Onion Diarrhea   Other Other (See Comments)   Rosuvastatin Other (See Comments)    Leg pain   Shellfish Allergy Nausea And Vomiting   Sulfa Antibiotics    Sulfonamide Derivatives Other (See Comments)    headaches    Immunization History  Administered Date(s) Administered   Fluad Quad(high Dose 65+) 07/17/2019   Hepatitis A  05/23/2010   Hepatitis B 06/23/2006   Influenza Split 08/11/2012, 08/14/2017   Influenza Whole 09/08/2011   Influenza, High Dose Seasonal PF 08/24/2016, 07/24/2017, 08/31/2018, 08/15/2020, 09/26/2020, 09/12/2021, 08/24/2022   Influenza,inj,Quad PF,6+ Mos 09/07/2013   Influenza-Unspecified 09/26/2014, 07/27/2015   MMR 05/23/2010   Moderna Sars-Covid-2 Vaccination 07/30/2021, 01/01/2022   PFIZER(Purple Top)SARS-COV-2 Vaccination 12/16/2019, 01/08/2020, 07/18/2020, 08/11/2022   Pneumococcal Conjugate-13 03/15/2014   Pneumococcal Polysaccharide-23 01/11/2018, 09/26/2020, 08/26/2021   Respiratory Syncytial Virus Vaccine,Recomb Aduvanted(Arexvy) 07/28/2022   Td 02/22/2003   Tdap 10/28/2012   Zoster Recombinat  (Shingrix) 02/15/2017, 05/17/2017   Zoster, Live 12/04/2010    Past Medical History:  Diagnosis Date   Allergy    Arthritis    history spinal stenosis. osteoarthritis right hip   Asthma    Cataract    Coronary artery calcification seen on CAT scan 08/19/2017   >300 on CT scan 08/2017   DDD (degenerative disc disease), thoracic    Depression    DOE (dyspnea on exertion)    a. 04/2010 Lexi MV EF 71%, no ischemia/infarct;     Fatty liver    GERD (gastroesophageal reflux disease)    H/O steroid therapy    Steroid use orally over 4 yrs- for Lung Fibrosis   Heart palpitations 0000000   Helicobacter pylori ab+    Hemorrhoids    Hiatal hernia    High cholesterol    History of chronic bronchitis    as child   History of migraine    History of MRSA infection    Hyperlipidemia, mixed 08/19/2017   Hyperplastic colon polyp 2007   IBS (irritable bowel syndrome)    Inguinal hernia    right   Insulin resistance    past   Interstitial lung disease (HCC)    MVP (mitral valve prolapse)    Posterior mitral valve leaflet with mild MR   Pneumonia    Pneumonitis, hypersensitivity ([place])    a. 09/2012 s/p Bx - ? 2/2 bird, mold, oil paint exposure ->on steroids, followed by pulm.   PONV (postoperative nausea and vomiting)    Pre-diabetes    takes Metformin   Pulmonary fibrosis (HCC)    Dr. Chase Caller follows- stable at present   PVC (premature ventricular contraction) 08/19/2017   Rapid heart rate    Dr. Radford Pax follows- last visit Epic note 9'16   Squamous cell carcinoma of skin    Tinnitus, right ear    Vocal cord ulcer     Tobacco History: Social History   Tobacco Use  Smoking Status Never  Smokeless Tobacco Never  Tobacco Comments   pt states she experimented in college   Counseling given: Not Answered Tobacco comments: pt states she experimented in college   Outpatient Medications Prior to Visit  Medication Sig Dispense Refill   acetaminophen (TYLENOL) 500 MG tablet  Tylenol Extra Strength     albuterol (PROAIR HFA) 108 (90 Base) MCG/ACT inhaler Inhale 2 puffs into the lungs every 4 (four) hours as needed for wheezing. Or coughing spells.  You may use 2 Puffs 5-10 minutes before exercise. 1 Inhaler 3   Alpha-D-Galactosidase (BEANO PO) Take 1 tablet by mouth as needed (if eating onion or garlic).      Ascorbic Acid (VITAMIN C) 1000 MG tablet Take 1,000 mg by mouth daily.     ASHWAGANDHA PO Take 2 capsules by mouth daily.     azelastine (ASTELIN) 0.1 % nasal spray Place 1 spray into both nostrils at bedtime.     BREO  ELLIPTA 100-25 MCG/ACT AEPB Inhale 1 puff into the lungs daily. 180 each 3   chlorpheniramine (CHLOR-TRIMETON) 4 MG tablet Take 2 mg by mouth 2 (two) times daily. Takes half tablet twice daily     dicyclomine (BENTYL) 10 MG capsule Take 1 capsule by mouth daily as needed.     diltiazem (CARDIZEM CD) 120 MG 24 hr capsule Take 1 capsule (120 mg total) by mouth daily. 90 capsule 3   diphenhydrAMINE-PE-APAP (DELSYM COUGH/COLD NIGHT TIME) 12.5-5-325 MG/10ML LIQD Take by mouth as needed.     EPINEPHrine 0.3 mg/0.3 mL IJ SOAJ injection Inject into the muscle.     ezetimibe (ZETIA) 10 MG tablet Take 1 tablet (10 mg total) by mouth daily. Please keep upcoming appt for future refills. Thank you. 90 tablet 3   fluticasone (FLONASE) 50 MCG/ACT nasal spray Spray 2 sprays into each nostril every day. 48 g 1   lactase (LACTAID) 3000 units tablet Take by mouth.     levocetirizine (XYZAL) 5 MG tablet Take 5 mg by mouth at bedtime.     metoprolol succinate (TOPROL XL) 25 MG 24 hr tablet Take 1 tablet (25 mg total) by mouth daily. (Patient taking differently: Take 12.5 mg by mouth daily.) 90 tablet 1   montelukast (SINGULAIR) 10 MG tablet TAKE 1 TABLET BY MOUTH EVERYDAY AT BEDTIME 90 tablet 2   Multiple Vitamin (MULTIVITAMIN) tablet Take 1 tablet by mouth daily.     nystatin-triamcinolone (MYCOLOG II) cream nystatin-triamcinolone 100,000 unit/g-0.1 % topical cream      omeprazole (PRILOSEC OTC) 20 MG tablet Take 20 mg by mouth daily.     OXYGEN Inhale into the lungs. 2 to 3 liters as needed during the day and nite time use on/off     OZEMPIC, 0.25 OR 0.5 MG/DOSE, 2 MG/1.5ML SOPN Inject into the skin.     pravastatin (PRAVACHOL) 40 MG tablet Take 1 tablet (40 mg total) by mouth every evening. 90 tablet 1   predniSONE (DELTASONE) 10 MG tablet Take 4 tablets (40 mg total) by mouth daily with breakfast for 4 days, THEN 3 tablets (30 mg total) daily with breakfast for 4 days, THEN 2 tablets (20 mg total) daily with breakfast for 4 days. 20 tablet 0   Sodium Chloride, Inhalant, 7 % NEBU Use per nebulizer once a day to help clear sputum 120 mL 3   nitroGLYCERIN (NITROSTAT) 0.4 MG SL tablet Place 1 tablet (0.4 mg total) under the tongue every 5 (five) minutes as needed for chest pain. 25 tablet 12   predniSONE (DELTASONE) 5 MG tablet Take 1 tablet (5 mg total) by mouth daily with breakfast. (Patient not taking: Reported on 01/20/2023) 90 tablet 3   doxycycline (VIBRA-TABS) 100 MG tablet Take 1 tablet (100 mg total) by mouth 2 (two) times daily. (Patient not taking: Reported on 01/20/2023) 10 tablet 0   No facility-administered medications prior to visit.     Review of Systems:   Constitutional:   No  weight loss, night sweats,  Fevers, chills, fatigue, or  lassitude.  HEENT:   No headaches,  Difficulty swallowing,  Tooth/dental problems, or  Sore throat,                No sneezing, itching, ear ache, nasal congestion, post nasal drip,   CV:  No chest pain,  Orthopnea, PND, swelling in lower extremities, anasarca, dizziness, palpitations, syncope.   GI  No heartburn, indigestion, abdominal pain, nausea, vomiting, diarrhea, change in bowel habits, loss of  appetite, bloody stools.   Resp: No shortness of breath with exertion or at rest.  No excess mucus, no productive cough,  No non-productive cough,  No coughing up of blood.  No change in color of mucus.  No  wheezing.  No chest wall deformity  Skin: no rash or lesions.  GU: no dysuria, change in color of urine, no urgency or frequency.  No flank pain, no hematuria   MS:  No joint pain or swelling.  No decreased range of motion.  No back pain.    Physical Exam  BP (!) 120/50 (BP Location: Left Arm, Patient Position: Sitting, Cuff Size: Normal)   Pulse 80   Temp 97.9 F (36.6 C) (Oral)   Ht 5' (1.524 m)   Wt 138 lb 9.6 oz (62.9 kg)   SpO2 (!) 87% Comment: up to 95% on 3L pulsed.  BMI 27.07 kg/m   GEN: A/Ox3; pleasant , NAD, well nourished    HEENT:  [place]/AT,  EACs-clear, TMs-wnl, NOSE-clear, THROAT-clear, no lesions, no postnasal drip or exudate noted.   NECK:  Supple w/ fair ROM; no JVD; normal carotid impulses w/o bruits; no thyromegaly or nodules palpated; no lymphadenopathy.    RESP  Clear  P & A; w/o, wheezes/ rales/ or rhonchi. no accessory muscle use, no dullness to percussion  CARD:  RRR, no m/r/g, no peripheral edema, pulses intact, no cyanosis or clubbing.  GI:   Soft & nt; nml bowel sounds; no organomegaly or masses detected.   Musco: Warm bil, no deformities or joint swelling noted.   Neuro: alert, no focal deficits noted.    Skin: Warm, no lesions or rashes    Lab Results:  CBC    Component Value Date/Time   WBC 12.5 (H) 10/27/2022 1624   RBC 4.97 10/27/2022 1624   HGB 14.2 10/27/2022 1624   HCT 43.8 10/27/2022 1624   PLT 261.0 10/27/2022 1624   MCV 88.1 10/27/2022 1624   MCH 29.5 10/04/2019 0216   MCHC 32.4 10/27/2022 1624   RDW 14.4 10/27/2022 1624   LYMPHSABS 1.8 10/27/2022 1624   MONOABS 0.5 10/27/2022 1624   EOSABS 0.1 10/27/2022 1624   BASOSABS 0.2 (H) 10/27/2022 1624    BMET    Component Value Date/Time   NA 137 10/27/2022 1624   NA 142 12/08/2018 0917   K 4.6 10/27/2022 1624   CL 102 10/27/2022 1624   CO2 28 10/27/2022 1624   GLUCOSE 140 (H) 10/27/2022 1624   BUN 21 10/27/2022 1624   BUN 18 12/08/2018 0917   CREATININE 1.08  10/27/2022 1624   CREATININE 0.91 02/26/2015 1540   CALCIUM 9.6 10/27/2022 1624   GFRNONAA >60 10/04/2019 0216   GFRNONAA 66 02/26/2015 1540   GFRAA >60 10/04/2019 0216   GFRAA 77 02/26/2015 1540    BNP No results found for: "BNP"  ProBNP    Component Value Date/Time   PROBNP 32.0 10/27/2022 1624    Imaging: DG Chest 2 View  Result Date: 01/20/2023 CLINICAL DATA:  Interstitial lung disease flare. EXAM: CHEST - 2 VIEW COMPARISON:  12/24/2021 FINDINGS: Heart size is normal. Mediastinal shadows are normal. Changes of chronic interstitial lung disease with widespread abnormal interstitial markings appears similar. One could question if the central bronchial structures show more thickening that could go along with bronchitis. No evidence of consolidation, collapse or effusion. No abnormal bone finding. IMPRESSION: Chronic interstitial lung disease. Question bronchitis. No evidence of consolidation or collapse. No significant change since 12/24/2021. Electronically Signed  By: Nelson Chimes M.D.   On: 01/20/2023 10:16         Latest Ref Rng & Units 12/09/2022   11:48 AM 07/21/2022    9:50 AM 01/06/2022    9:51 AM 10/03/2021    8:59 AM 01/14/2021    1:51 PM 11/06/2020   11:03 AM 07/18/2020   10:53 AM  PFT Results  FVC-Pre L 1.53  1.54  1.58  1.48  1.49  1.41  1.47   FVC-Predicted Pre % 66  67  67  63  62  59  62   Pre FEV1/FVC % % 87  91  90  92  92  93  88   FEV1-Pre L 1.32  1.40  1.43  1.37  1.37  1.31  1.30   FEV1-Predicted Pre % 77  81  81  78  77  73  73   DLCO uncorrected ml/min/mmHg 9.64  8.16  8.62  8.92  11.26  9.70  11.19   DLCO UNC% % 59  50  53  55  69  59  68   DLCO corrected ml/min/mmHg 9.41  7.86  8.62  8.92  11.26  10.53  11.19   DLCO COR %Predicted % 58  48  53  55  69  64  68   DLVA Predicted % 81  64  75  78  92  89  90     Lab Results  Component Value Date   NITRICOXIDE 10 01/13/2016        Assessment & Plan:   Hypersensitivity pneumonitis due to bird  exposure, ? oil pain and ? mold in house Recurrent exacerbation.  Check high-resolution CT chest.  Will treat with empiric antibiotics and steroid burst for possible superimposed acute bronchitis plus or minus ILD flare  Plan  Patient Instructions  Sputum culture Augmentin '875mg'$  Twice daily  for 1 week with food. Taper prednisone as planned, hold at '10mg'$  daily  Continue on Oxygen up to 6l/m to keep O2 sats >88-90%.  Set up HRCT chest for ILD.  Follow up in 3-4 weeks with Dr. Chase Caller and As needed   Please contact office for sooner follow up if symptoms do not improve or worsen or seek emergency care       Chronic respiratory failure with hypoxia ([place]) Continue on oxygen to maintain O2 saturations greater than 88 to 90%.  Recent increased oxygen demands requiring continuous flow oxygen.  Will need to add a concentrator at home with continuous flow as patient only has a POC device that goes up to 3 L pulsed.   Plan  Patient Instructions  Sputum culture Augmentin '875mg'$  Twice daily  for 1 week with food. Taper prednisone as planned, hold at '10mg'$  daily  Continue on Oxygen up to 6l/m to keep O2 sats >88-90%.  Set up HRCT chest for ILD.  Follow up in 3-4 weeks with Dr. Chase Caller and As needed   Please contact office for sooner follow up if symptoms do not improve or worsen or seek emergency care         Rexene Edison, NP 01/20/2023

## 2023-01-20 NOTE — Assessment & Plan Note (Signed)
Recurrent exacerbation.  Check high-resolution CT chest.  Will treat with empiric antibiotics and steroid burst for possible superimposed acute bronchitis plus or minus ILD flare  Plan  Patient Instructions  Sputum culture Augmentin '875mg'$  Twice daily  for 1 week with food. Taper prednisone as planned, hold at '10mg'$  daily  Continue on Oxygen up to 6l/m to keep O2 sats >88-90%.  Set up HRCT chest for ILD.  Follow up in 3-4 weeks with Dr. Chase Caller and As needed   Please contact office for sooner follow up if symptoms do not improve or worsen or seek emergency care

## 2023-01-20 NOTE — Telephone Encounter (Signed)
Pt saw Ms. Parrett as a acute today. Pls call. She req a nurse call because of issues w/[facility] inhaler. Her # is [phone]

## 2023-01-20 NOTE — Patient Instructions (Addendum)
Sputum culture Augmentin '875mg'$  Twice daily  for 1 week with food. Taper prednisone as planned, hold at '10mg'$  daily  Continue on Oxygen up to 6l/m to keep O2 sats >88-90%.  Set up HRCT chest for ILD.  Follow up in 3-4 weeks with Dr. Chase Caller and As needed   Please contact office for sooner follow up if symptoms do not improve or worsen or seek emergency care

## 2023-01-21 ENCOUNTER — Other Ambulatory Visit: Payer: Medicare PPO

## 2023-01-21 DIAGNOSIS — J679 Hypersensitivity pneumonitis due to unspecified organic dust: Secondary | ICD-10-CM

## 2023-01-21 DIAGNOSIS — J849 Interstitial pulmonary disease, unspecified: Secondary | ICD-10-CM

## 2023-01-21 NOTE — Telephone Encounter (Signed)
Yes that is fine ,she can check with DME and then let us know if she needs additional orders to DME We sent order yesterday for best fit device .

## 2023-01-21 NOTE — Telephone Encounter (Signed)
Called and spoke with patient. She verbalized understanding. She stated that she has an appt with [facility] tomorrow afternoon to discuss the POC.   Nothing further needed at time of call.

## 2023-01-21 NOTE — Telephone Encounter (Signed)
Called and spoke to pt. Pt states all is taken care of (see phone note from 2/28). Will close encounter.

## 2023-01-21 NOTE — Telephone Encounter (Signed)
Called and spoke with patient. Patient stated that she does not think it will be useful for her to use the tanks. She stated that her oxygen has gotten better and this is her 3rd day on prednisone and her cough is better. Patient stated that she wants to go into [facility] and see if she can get something different or something portable because she doesn't need the tank all the time.   TP, please advise.

## 2023-01-22 ENCOUNTER — Encounter: Payer: Self-pay | Admitting: Adult Health

## 2023-01-24 LAB — RESPIRATORY CULTURE OR RESPIRATORY AND SPUTUM CULTURE
MICRO NUMBER:: 14632219
RESULT:: NORMAL
SPECIMEN QUALITY:: ADEQUATE

## 2023-01-27 ENCOUNTER — Ambulatory Visit
Admission: RE | Admit: 2023-01-27 | Discharge: 2023-01-27 | Disposition: A | Discharge status: Home / Self Care | Payer: Medicare PPO | Source: Ambulatory Visit | Attending: Adult Health | Admitting: Adult Health

## 2023-01-27 DIAGNOSIS — J849 Interstitial pulmonary disease, unspecified: Secondary | ICD-10-CM

## 2023-01-28 NOTE — Progress Notes (Signed)
Called and spoke with patient, advised of results/recommendations per Rexene Edison NP.  She verbalized understanding.  She stated she is doing much better.  Nothing further needed.

## 2023-01-29 NOTE — Progress Notes (Signed)
No change in fibrosis on CT x 2 years

## 2023-02-03 NOTE — Progress Notes (Signed)
Called and spoke with patient, advised of results/recommendations per Tammy Parrett NP.  She verbalized understanding.  Nothing further needed.

## 2023-02-09 ENCOUNTER — Other Ambulatory Visit: Payer: Self-pay | Admitting: Cardiology

## 2023-02-11 ENCOUNTER — Telehealth: Payer: Self-pay | Admitting: Internal Medicine

## 2023-02-11 ENCOUNTER — Encounter: Payer: Self-pay | Admitting: Internal Medicine

## 2023-02-11 ENCOUNTER — Ambulatory Visit: Payer: Medicare PPO | Admitting: Internal Medicine

## 2023-02-11 VITALS — BP 106/50 | HR 88 | Temp 98.4°F | Ht 60.0 in | Wt 138.4 lb

## 2023-02-11 DIAGNOSIS — Z7952 Long term (current) use of systemic steroids: Secondary | ICD-10-CM

## 2023-02-11 DIAGNOSIS — K449 Diaphragmatic hernia without obstruction or gangrene: Secondary | ICD-10-CM | POA: Diagnosis not present

## 2023-02-11 DIAGNOSIS — J679 Hypersensitivity pneumonitis due to unspecified organic dust: Secondary | ICD-10-CM

## 2023-02-11 DIAGNOSIS — J849 Interstitial pulmonary disease, unspecified: Secondary | ICD-10-CM

## 2023-02-11 DIAGNOSIS — J9611 Chronic respiratory failure with hypoxia: Secondary | ICD-10-CM

## 2023-02-11 DIAGNOSIS — Z79899 Other long term (current) drug therapy: Secondary | ICD-10-CM

## 2023-02-11 DIAGNOSIS — Z5181 Encounter for therapeutic drug level monitoring: Secondary | ICD-10-CM

## 2023-02-11 MED ORDER — PREDNISONE 20 MG PO TABS: 20.0000 mg | ORAL_TABLET | Freq: Every day | ORAL | 5 refills | Status: DC

## 2023-02-11 MED ORDER — PREDNISONE 10 MG PO TABS: ORAL_TABLET | ORAL | 0 refills | Status: DC

## 2023-02-11 NOTE — Telephone Encounter (Signed)
Rx team: starting cellcept 500mg  bid after safety labs in with bactrim proph. She wlll need a pharm consult pre start

## 2023-02-11 NOTE — Patient Instructions (Addendum)
ICD-10-CM   1. Hypersensitivity pneumonitis ([place])  J67.9     2. ILD (interstitial lung disease) ([place])  J84.9     3. Hiatal hernia  K44.9     4. Current chronic use of systemic steroids  Z79.52          Seems inflmmatory component is making you require prednisone (more in winter) and now in fall making me quite concerned that ILD is progressive. Also concerned that you are developing pulmonary hypertension. Too bad not better despite starting allergy shots though only recently  Plan  - check blood Quantiferon gold, G6PD, Hepatitis Virus Panel, cbc with diff, bmet, lft. BNP 02/11/2023 - check ECHO next weeks - escalate prednisone  - 40mg  daily x 2 weeks and then 30mg  daily x 2 weeks and then stay at 20mg  per day - start cellcept - initially 500mg  twice daily  with bactrim prophylaxis  - before you start await safety labs  - refer pharmacist [facility] for safety reivew and start - no clinical trial for now -continue to  control Acid reflux well - use o2 to keep pusle ox > 88% (night and exercise)  - respect no interest in transplant  Followup - spirometry and dlco in 5-6 weeks  - 30 min visit in 5-6 weeks with DR Chase Caller

## 2023-02-11 NOTE — Progress Notes (Signed)
#GE reflux with small hiatal hernia  - on ppi   #History of rapid heart rate not otherwise specified  - Start her on Lopressor  2008/2009 by primary care physician. History appears to correlate with onset of respiratory issues  - refuses to dc this drug as of 2014 discussion   # Hypersensitivity Pneumonitis and ILD  - Potential etiologies - cockateil x 2 for 18 years through end 2012. 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 Denies mold but lives in house built in [place] with a "weird baselment: and has humidifier  - first noted on CT chest 10/15/09 following trip to [place] (PE negative) but not described in 2003 CT chest report  - autoimmune panel: 10/23/11: Negative  -  Uderwennt bronch 11/19/11  - non diagnostic  - VATS Nov 2013 (done after initially refusing)- [place]. However, there is worrying trend of UIP pattern in the Upper lobes.         OV 06/17/2017  Chief Complaint  Patient presents with   Follow-up    [name]. Pt denies significant cough, denies CP/tightness, f/c/s.     She is better. Feels she does not need o2. Has been to [facility] for transplant clinic; felt too early. Seems higher dose prednisone helping but then she is also bettter t his time of the year. In May 2018 I was at [facility] and curbsided national thought leaders - feel that we could explore silent active GERD as a possibility of ILD getting wors with time.   OV 01/11/2018  Chief Complaint  Patient presents with   Follow-up    PFT done today.  Pt states she has been coughing x2 weeks since she returned from a trip to [place]. From coughing, pt has had pain in ribs. Pt states she has mild SOB which is stable due to rehab.  Pt states that Dr. Darron Doom is wanting to know if pt could possibly have eosinophilic esophagitis.   Went to [place]. Returned and has been  coughing x 2 weeks. Associated with this is some rib pain on right side. She feels she does not need o2. She feels she does not need pred/abx at this time. She also exposed to flu wit family   OV 02/08/2018  Chief Complaint  Patient presents with   Acute Visit    CXR done 02/07/18.  Pt states her throat is sore but not as bad as it was yesterday when in office and does have some left ear pain. Pt states she has some pain on right rib cage and has increased fatigue.     Ms. Spoonemore presents acutely.  I last saw her one month ago gave her Tamiflu for prophylaxis after flu exposure.  At that point in time she was already reducing her chronic stable prednisone of 10 mg for interstitial lung disease/hypersensitivity pneumonitis.  She had tapered herself slowly 3 or 4 weeks ago to 5 mg.  She says she was doing fairly okay but in the last 2 or 3 weeks she has had increased fatigue.  She says she goes daily swimming and then feels energized but then an hour later starts having fatigue which is more than usual.  Also in the daytime she has random fatigue.  In terms of her respiratory symptoms these are stable and in fact slightly better in terms of shortness of breath  and cough but she is having sore throat without any fever and some left-sided otalgia.  In addition she is having right-sided chest pains that are more than usual.  The chest pains have been reported before.  These are specifically at localized spots along the previous incision several years ago for interstitial lung disease.  They are tender as well to touch.  This pain is worse.  In addition she is also being bothered by arthralgia in her hand and left hip.  The left hip arthralgias old with a hand arthralgias new.  She is worried about pneumothorax recurrence and so she had a chest x-ray yesterday that shows no pneumothorax.  She has stable interstitial lung disease changes but in my personal visualization these interstitial lung disease changes  are worse compared to several years ago.  We do know that she has slowly worsening interstitial lung disease.  In terms of a walking desaturation test this shows stability since last visit.  She has seen GI for acid reflux and has pH probe study coming up  In talking to her she tells me she has been on metformin for over a month or 2.  This is new and is meant for pre-diabetes.  Apparently her hemoglobin A1c is always below 6 but is above 5.5.  Walking desaturation test on 02/08/2018 185 feet x 3 laps on ROOM AIR:  did walk normal pace with forehead probe desaturate. Rest pulse ox was 99%, final pulse ox was 93%. HR response 72/min at rest to 93/min at peak exertion. Patient JAMYLA ARD  Did not Desaturate < 88% . Rhonda [name] yes did  Desaturated </= 3% points. Rhonda [name] yes did get tachyardic   Dg Chest 2 View  Result Date: 02/07/2018 CLINICAL DATA:  Chest wall pain for 2 weeks EXAM: CHEST - 2 VIEW COMPARISON:  CT 08/20/2017, radiograph 10/11/2015 FINDINGS: Coarse interstitial pattern consistent with pulmonary fibrosis, similar distribution compared to prior. No focal pulmonary opacity or pleural effusion. Stable cardiomediastinal silhouette with aortic atherosclerosis. No pneumothorax. Degenerative changes of the spine. IMPRESSION: No active cardiopulmonary disease. Similar appearance of diffuse pulmonary fibrosis. Negative for a pneumothorax. Electronically Signed   By: Donavan Foil M.D.   On: 02/07/2018 14:26    OV 04/13/2018  Chief Complaint  Patient presents with   Follow-up    Pt had pre spiro and DLCO PFT prior to OV. Pt has increase of productive cough-thick white in last week. Pt has some wheezing, and allergy issues.   Ms. Freda Munro presents for routine follow-up.  I saw her acutely just approximately 2 months ago.  Then end of April 2019 she saw a nurse practitioner again acutely and was given antibiotic and prednisone.  She says she felt better after that but in the  last week or so due to increased pollen load in the community and also moving furniture because her daughter is relocating out of her home she started having more cough.  There is no change in her dyspnea with the cough is really bad.  This is despite Singulair and Chlor-Trimeton.  Those things to help but just take the edge off.  She feels that the pollen making the cough worse.  There is no change in dyspnea.  Walking desaturation test documented below his baseline.  She had pulmonary function test today but the [facility] shows significant decline compared to February 2019 and she feels surprised by this.  DLCO is baseline unchanged and a walking desaturation test limited  in the office is also unchanged.  She does not have any fever but has significant postnasal drip.  She has not followed up at [facility] transplant clinic or the allergist recently.  OV 08/16/2018  Subjective:  Patient ID: Rhonda [name], female , DOB: February 10, 1951 , age 74 y.o. , MRN: [id number] , AD[address]ESS: [address] [address]   08/16/2018 -   Chief Complaint  Patient presents with   Follow-up    PFT performed today.  Pt states she has had a lot of mucus the month of September 2019 so she has been taking the Chlor-Trimeton. States other than that she has been able to do a lot more activities and has been swimming a lot more. Pt states she believes the SOB is stable.     HPI QUINLYN TEP 72 y.o. -continues to do well.  She is taking higher dose of prednisone 10 mg/day.  Also the summer usually she is doing well.  She is not having much of a cough.  Lung function test shows improvement compared to tests done earlier this year.  In fact she is almost as good as much 2018.  Still compared to 2014-2016 she has progressive lung disease.  Walking desaturation test is stable.  She needs a high-dose flu shot but we do not have stock today.  She has lost some weight intentionally with better dietary control.  She is  exercising regularly swimming.  We discussed ofev  potential future therapy if the drug is approved for this indication.  We will know study results in a year or so.  She is open to the idea but is worried somewhat about her irritable bowel syndrome flaring up.  She is no longer following at the [facility] lung transplant clinic given stability lung function  Other issues: She always has right-sided chest wall pain to the site of lung biopsy.  The only way this is been resolved as by her limiting her use of bra .  Also her irritable bowel syndrome is under control but in case it flares up she wants a refill of dicyclomine     OV 12/15/2018  Subjective:  Patient ID: Rhonda [name], female , DOB: [birth year] , age 42 y.o. , MRN: [id number] , AD[address]ESS: [address] [place] [address]   12/15/2018 -   Chief Complaint  Patient presents with   Follow-up    Pt states she has been doing better since last visit with TP but states she has been having pain in her rib cage x2 years but states it has become more intense.     HPI LOUIE MEADERS 72 y.o. -returns for follow-up of her ILD due to hypersensitivity pneumonitis.  She did a clinic visit also for research with the ILD-pro registry study.  Since I last saw her in September 2019 she had a flareup and required higher doses of prednisone.  Since then she is returned to baseline of 5 mg prednisone and she feels good.  She is stable.  Walking desaturation test shows stability.  We have been discussing over the last few months about taking nintedanib based on new data and progressive non--IPF ILD's.  She has trepidation for this because of irritable bowel syndrome.  After much discussion she is willing to try 100 mg once daily for a month and then escalate to 100 mg twice daily which is the therapeutic dose.  She will do this once after insurance approval which we suspect  will happen over the next few months.  Her main issue is that her right  sided infra-axillary chest pain is getting worse.  This started after her surgical lung biopsy and pneumothorax on the right side.  It is random and happens with twisting.  But now days it happens once every few days.  Previously it used to happen once every few weeks.  Sometimes it is excruciating but then she is left in pain for a few days.  Applying Abrol sudden twisting of her body makes it worse.  Is a lancinating pain.  She did try gabapentin some years ago for chronic cough .  She did tolerate the gabapentin well.  She does not remember if it helped the pain or not but clearly back and the pain was much less intense.  She is willing to try this again.  I recommended a CT scan of the chest but she is going to have radiation for a CT coronary angiogram because of high levels of coronary artery calcification.  Therefore written to the radiologist to see if he can look at the lung parenchyma with a CT angiogram    OV 05/09/2019  Subjective:  Patient ID: Rhonda [name], female , DOB: 1951/01/30 , age 43 y.o. , MRN: [id number] , AD[address]ESS: [address] [place] [address]   05/09/2019 -   Chief Complaint  Patient presents with   ILD (Interstitial Lung Disease)     HPI NAYLANI BRADNER 72 y.o. -follow-up for chronic care physician pneumonitis on daily 5 mg prednisone.  She has problems with right-sided chest wall pain that is neuropathic.  She tells me since her last visit January 2020 she has been isolating and social distancing because of the pandemic.  Overall she is been stable.  Her symptom scores are mild and documented below.  She continues with prednisone.  She was supposed to have pulmonary function test but this was held because of the pandemic.  Her walking desaturation test shows she is stable.  She is supposed to have pulmonary function test tomorrow but wanted to see me today.  In the interim she did have a cardiac CT scan of the chest that shows 94th percentile of coronary calcium.   She could not get a CT angiogram done because of PVC.  A nuclear medicine stress test was done and I reviewed this result and it was normal.  A Holter test is pending.  She is kind of nervous because of the ongoing COVID-19 situation and safety of doing a Holter test.  In terms of her right chest wall pain she is adapted to it.  She does not use any bra  OV 08/22/2019  Subjective:  Patient ID: Rhonda [name], female , DOB: 07-14-1951 , age 22 y.o. , MRN: [id number] , AD[address]ESS: [address] [address]   08/22/2019 -   Chief Complaint  Patient presents with   Follow-up    needs sx clearance for L hip replacement- not yet scheduled, pending clearance.     Patient lung disease chronic hypersensitive pneumonitis on chronic daily prednisone 5 mg/day  HPI MARNISHA STAMPLEY 72 y.o. -presents for follow-up of her interstitial lung disease due to chronic hypersensitive pneumonitis.  After last visit in June 2020 given her PFT stability and also her aversion to the potential side effects with nintedanib she decided to just continue with prednisone daily 5 mg/day.  Given the pandemic she is walking 1 mile a day without stopping.  Her interstitial lung disease symptom score is actually slightly better.  Overall she reports stability in her interstitial lung disease.  She has a new issue of getting preoperative clearance for her left hip.  She says the left hip is bone-on-bone and she does not want to take Aleve.  She prefers to have surgery.  Dr. Paralee Cancel is the surgeon.  Surgery date has not been set.  She is very functional.  She has normal renal function.  Normal nutritional status.  No anemia.  No respiratory infections in the last 1 month.  She is not on oxygen.  Age 22.  The surgeries in the left hip and the duration of surgery is under 3 hours.  Anesthesia will be general.      OV 01/30/2020  Subjective:  Patient ID: Rhonda [name], female , DOB: 1951-06-18 , age 3 y.o. , MRN:  [id number] , AD[address]ESS: [address] [address] Patient lung disease chronic hypersensitive pneumonitis on chronic daily prednisone 5 mg/day  01/30/2020 -   Chief Complaint  Patient presents with   Follow-up    PFT performed 3/8.  Pt states she believes she has been going downhill for the past 2 months. Pt was started on O2 at last OV and wears it prn. Pt does have an occ cough.     HPI ARIANIS BOWDITCH 72 y.o. -presents for follow-up of interstitial lung disease secondary to chronic hypersensitive pneumonitis.  She maintains herself on prednisone 5 mg/day.  In the interim she has had hip surgery and this did wonders for her.  She is able to walk longer distances.  However she does notice that her dyspnea has declined.  She says since January 2021 his symptoms have declined particularly with cough.  Usually in the winter she goes through a cycle of severe cough and exacerbation that requires higher levels of prednisone.  She has been social distancing well and masking well.  She states there is absolutely no mold or water any antigen exposure at home.  But she feels it is her allergies acting up and making her more symptomatic.  She has had a Covid vaccine and is wondering about either going back to swimming or starting pulmonary rehabilitation.  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  In fact when we walked her today with the mask she did drop down to 89%.  This is a decline for her.  She had pulmonary function test and this shows significant decline in FVC and DLCO.  In review of this that.  Where she has declined this much but is always bounce back with steroids.  At last visit we discussed nintedanib for her as a way to prevent progression of her ILD but given her concerns of GI side effects and the presence of irritable bowel syndrome she is declined.  Infective have this conversation with her  including her joining the [employer] at [facility] but she has always been worried about the GI side effects with nintedanib.  This time she is more open to it because of the decline in lung function.  She wants to see an allergist  She also told me that she continues to have a restrictive chest pain particularly on the right side lower area.  She no longer wears a brassiere it has been nearly 3 years since she had a high-resolution CT chest.  She  is open to having one now.  Last liver function test was normal in January 2020.  Last renal function was normal in November 2020.      OV 03/19/2020  Subjective:  Patient ID: Rhonda [name], female , DOB: 1951-01-06 , age 34 y.o. , MRN: [id number] , AD[address]ESS: [address] [place] [address]  Chronic hypersensitive pneumonitis interstitial lung disease on prednisone daily 10 mg 03/19/2020 -   Chief Complaint  Patient presents with   Follow-up    Pt states she is doing better since last visit and states her breathing has also improved.     HPI KINETA FUDALA 72 y.o. -at last visit in March 2021 there was decline in lung function.  Therefore we started nintedanib.  She took 1 tablet of 150 mg and then had immediate abdominal pain and fever.  Therefore she is decided against taking this.  I also support this decision because I do not think she will tolerate nintedanib.  We did extensive interstitial lung disease question a history again at that time.  She had taken 1 dose of nitrofurantoin.  Have indicated to her that she should never take this medicine again.  In addition discovered that she might have some feather jackets at home she has now gotten rid of it.  She is now doing pulmonary rehabilitation.  Her overnight oxygen desaturation test is negative.  At pulmonary rehabilitation she tells me she does not drop below 91% and 1 time she went to 89%.  Otherwise overall doing great.  Her cardiologist is increased her Lopressor.  Her daughter is getting  married and she is busy with the wedding planning this is being emotionally stressful for her.  Her allergist has increased her recommendations and this is also helping her.  Her allergist is retiring and she wanted recommendations for new allergist.  I have recommended Dr. Fredderick Phenix and Dr. Remus Blake.  Incidentally that right-sided chest pain that she had is also better with increased prednisone of 10 mg/day.  She feels prednisone is working really well for her.    OV 08/29/2020   Subjective:  Patient ID: Rhonda [name], female , DOB: 1951/06/05, age 25 y.o. years. , MRN: [id number],  ADDRESS: [address] [address] PCP  Hayden Rasmussen, MD Providers : Treatment Team:  Attending Provider: Brand Males, MD Patient Care Team: Hayden Rasmussen, MD as PCP - General (Family Medicine) Minus Breeding, MD as PCP - Cardiology (Cardiology) Brand Males, MD as Consulting Physician (Pulmonary Disease) Verl Blalock, Marijo Conception, MD (Inactive) (Cardiology) Kennith Center, RD as Dietitian (Family Medicine) Minus Breeding, MD as Consulting Physician (Cardiology)  Follow-up chronic hypersensitive pneumonitis on chronic prednisone 10 mg/day.  Last high-resolution CT chest March 2021.  Intolerant to nintedanib.  Chief Complaint  Patient presents with   Follow-up    Pt states she had been doing well since last visit up until 3-4 weeks ago and started having problems with her breathing and has had to use her O2 with activities. Pt also has been coughing a lot and is getting up clear phlegm. Pt also has had some mild wheeze.       HPI LAKEYTA VANDENHEUVEL 72 y.o. -returns for follow-up.  Since I last saw her her daughter is now married.  She had a great wedding.  She was able to enjoy this and dance quite well.  She barely used her oxygen her effort tolerance was good.  Some 3 weeks [place]  Apparently she had difficulty getting her oxygen tank  to clinic to the electrical port.  She noticed with exertion a pulse ox dropping to the 70s.  She said even before she left a few days prior to that she started having increasing cough.  She notices that these cough cycles get exacerbated in the fall in the winter.  Looking back at her pulmonary function test there is always a dip between October and March each year.  Between 2015 and 2017 there was a decline and since then there is a waxing and waning quality with dips in the winter in the fall season.  Each time she responds to prednisone.  Most recent pulmonary function test in August 2021 is actually improved compared to March 2021.  However today she is feeling worse.  She feels something is in her airway.  She feels dry air in the house and the animals bringing leaves from outside is contributing.  She says she feels better upstairs than downstairs where the dogs.  She again denies any mold or mildew.  Denies any feather jacket exposure.  She feels she will benefit from another round of prednisone.  She feels in the past antibiotics have not helped her as much as prednisone.   In terms of chronic ILD HP: She not tolerate nintedanib in the past.  I explained to her that I am concerned about progression and her decline in functional quality and the risk for that.  We discussed pirfenidone as an alternative.  Discussed the fact is less well studied although have no reason to see why it would not be effective.  Explained the side effect profile.  She is willing to try this.  We decided to go donor samples from the patient support group  Oxygen qualification: We walked her and she qualified for oxygen today.  Is for portable oxygen with [employer]  Other issue: Spring allergies: -Her allergist is retired.  We discussed options of referral.  Referred her to Dr. Hardie Pulley referred to     OV 01/14/2021  Subjective:  Patient ID: Rhonda [name], female , DOB: 03/16/1951 , age 66 y.o. , MRN: [id number] ,  ADDRESS: [address] [address] PCP Hayden Rasmussen, MD Patient Care Team: Hayden Rasmussen, MD as PCP - General (Family Medicine) Minus Breeding, MD as PCP - Cardiology (Cardiology) Brand Males, MD as Consulting Physician (Pulmonary Disease) Verl Blalock, Marijo Conception, MD (Inactive) (Cardiology) Kennith Center, RD as Dietitian (Family Medicine) Minus Breeding, MD as Consulting Physician (Cardiology)  This Provider for this visit: Treatment Team:  Attending Provider: Brand Males, MD    01/14/2021 -   Chief Complaint  Patient presents with   Follow-up    Nauseous, loss of appetite    Follow-up chronic hypersensitive pneumonitis on prednisone 5 mg/day and also pirfenidone submaximal dose of 2 pills 3 times daily since October 2021.  On ILD-pro registry protocol  - failed ofev   - failed esbrit due to side effects Faythe Ghee 2022  She is also on Breo  HPI ALISSE TUITE 72 y.o. -returns for follow-up.  She is now on pirfenidone along with low-dose prednisone.  She feels the pirfenidone is not reacting well with her at all.  She has intermittent loss of taste at least twice a week.  She has loss of appetite and she has forces herself to eat.  She also has fatigue.  She says that  if I strongly recommended then she will continue to soldier on with it.  Otherwise she feels less short of breath.  Pulmonary function test reviewed and is actually stable/slightly improved.  She says the cough is extremely well controlled except today after the pulmonary function test she was coughing quite a bit.  She is looking forward to going back to swimming.  She has had a Covid vaccine in booster and also status post EVUSHELD monoclonal antibody prophylaxis.  We discussed the possibility about possibility of enrolling in inhaled nitric oxide device/administration study.  This is to see if people can improve in the shortness of breath scale and also physical activity.  She took the consent form and  is deliberating.  She thinks she might find the study and inconvenience to her lifestyle where she is wanting to be more active.  We went over several details of this research protocol  She has a new symptom of like itching sensation deep in the right flank.  She feels it is not the skin it is from deep inside.  She is wondering if it could be related to pirfenidone.  Started after going on pirfenidone.  Explained to her that I am not seeing this in other patients with pirfenidone but will have to be open minded.  She does not apply Vaseline to her skin in the winter.  We discussed the possibility this could be dry skin related but she does not have other features of dry skin.  Nevertheless she is willing to try Vaseline.   Results for CHAPP   OV 07/08/2021  Subjective:  Patient ID: Rhonda [name], female , DOB: [birth year] , age 82 y.o. , MRN: [id number] , ADDRESS: [address] [place] [address] PCP Hayden Rasmussen, MD Patient Care Team: Hayden Rasmussen, MD as PCP - General (Family Medicine) Minus Breeding, MD as PCP - Cardiology (Cardiology) Brand Males, MD as Consulting Physician (Pulmonary Disease) Verl Blalock, Marijo Conception, MD (Inactive) (Cardiology) Kennith Center, RD as Dietitian (Family Medicine) Minus Breeding, MD as Consulting Physician (Cardiology)  This Provider for this visit: Treatment Team:  Attending Provider: Brand Males, MD    07/08/2021 -  ACUTE VISIT Chief Complaint  Patient presents with   Acute Visit    Pt states that she did start taking zpak yesterday 8/15 and said her cough is doing some better after being on second day of abx and said that she started prednisone today 8/16. States that she still has increased SOB. Pt did take a covid test which came back negative.   Follow-up chronic hypersensitive pneumonitis on prednisone 5 mg/day and also pirfenidone submaximal dose of 2 pills 3 times daily since October 2021.  On ILD-pro registry protocol -  failed ofev   - failed esbrit due to side effects marh 2022  She is also on Breo   HPI KEISHAWNA CARRANZA 72 y.o. -this is an acute visit.  Last seen in February 2022 after that she was supposed to see me back in 3 months.  But this visit has been scheduled acutely.  She tells me that early in May 2022 she went to [facility].  They rented a house.  She believes that the bedroom had some mold.  She did not visually confirmed the mold but husband might have.  She says shortly after going there and spending a week she started having worsening cough with hoarseness of voice.  This cough is persisted all along and the hoarseness has persisted  as well.  The in June 2022 she had a short course of steroids that did not help.  She believes the dosing was not strong enough.  In July 2022 she had some back issues and was given 30 mg of prednisone for approximately 10 days with a taper and this helped her back.  But all along the cough is persisted and severe hoarseness is persisted.  Then approximately a week ago she started getting shortness of breath along with yellow sputum.  The cough got worse .  She states even at baseline she brings her a lot of white sputum and has to cough a lot.  [place] and a couple of days into the illness.  At this point in time she started noticing she was more easily desaturating.  She is not able to swim.  She says walking to the car her pulse ox dropped to 84%.  Then the yellow phlegm started getting worse.  She called in yesterday and we prescribed Z-Pak and a prednisone burst.  She says now with the Z-Pak the sputum color is improving.  A week ago she also saw Dr. Blenda Nicely for chronic cough.  100 mg 3 times daily of gabapentin has been prescribed.  She is slowly increasing it.  She is worried about the side effects of this drug.  Apparently her vocal cord had ulcers from repeated coughing.  ILD symptom score shows worsening.  In the office  she is very hoarse   Of note she stopped her pirfenidone in February/March 2022 because of side effects - gerd  She uses oxygen with exertion.  There is no leg swelling or hemoptysis.  Started z pak yesterday Started 12d pred taper yesterday     PFT   OV 08/14/2021  Subjective:  Patient ID: Rhonda [name], female , DOB: [birth year] , age 22 y.o. , MRN: [id number] , ADDRESS: [address] [place] [address] PCP Hayden Rasmussen, MD Patient Care Team: Hayden Rasmussen, MD as PCP - General (Family Medicine) Minus Breeding, MD as PCP - Cardiology (Cardiology) Brand Males, MD as Consulting Physician (Pulmonary Disease) Verl Blalock, Marijo Conception, MD (Inactive) (Cardiology) Kennith Center, RD as Dietitian (Family Medicine) Minus Breeding, MD as Consulting Physician (Cardiology)  This Provider for this visit: Treatment Team:  Attending Provider: Brand Males, MD  Follow-up chronic hypersensitive pneumonitis on prednisone 5 mg/day and also pirfenidone submaximal dose of 2 pills 3 times daily since October 2021.  On ILD-pro registry protocol - failed ofev   - failed esbrit due to side effects Faythe Ghee 2022  She is also on Breo  08/14/2021 -   Chief Complaint  Patient presents with   Follow-up    Pt states she is feeling better since last visit. States she did receive her lighterweight POC which has been working well for her.     HPI TEAH VOTAW 72 y.o. -seen last month with worsening symptoms acute bronchitis/flare.  Given Z-Pak and prednisone.  She is significantly better.  She tells me that even though she is better and his subjective symptom score is better below.  She still feels not back to her May 2022 baseline.  She says in the neighborhood she has to use more oxygen.  She is interested in a backpack for oxygen.  She is not able to swim as well as she used to.  She says acid reflux is under better control now after seeing Dr.  Marcellino in ENT.  She is wondering  about starting pirfenidone she definitely does not want to do nintedanib again.  But even with pirfenidone she is reluctant.  We discussed CellCept because of progression but because of the immunosuppression she is reluctant.  We resolved that she would increase the prednisone to 10 mg/day and reassess.  She was supposed to have done a spirometry today but it did not happen.  She will have it done again in 4 weeks.  Have requested a schedule for that.    The main concern right now even though is better is that she is not to her baseline as of a year ago and she feels like she is having slowly progressive disease interstitial lung disease/chronic HP.       OV 10/03/2021  Subjective:  Patient ID: Rhonda [name], female , DOB: Apr 10, 1951 , age 14 y.o. , MRN: [id number] , AD[address]ESS: [address] [id number] PCP Hayden Rasmussen, MD Patient Care Team: Hayden Rasmussen, MD as PCP - General (Family Medicine) Minus Breeding, MD as PCP - Cardiology (Cardiology) Brand Males, MD as Consulting Physician (Pulmonary Disease) Verl Blalock, Marijo Conception, MD (Inactive) (Cardiology) Kennith Center, RD as Dietitian (Family Medicine) Minus Breeding, MD as Consulting Physician (Cardiology)  This Provider for this visit: Treatment Team:  Attending Provider: Brand Males, MD    10/03/2021 -   Chief Complaint  Patient presents with   Follow-up    Pt states her breathing has become worse since last visit. States she is having to use her O2 almost all the time now with exertion.    Follow-up chronic hypersensitive pneumonitis on prednisone 5 mg/day and also pirfenidone submaximal dose of 2 pills 3 times daily since October 2021.  On ILD-pro registry protocol - failed ofev due to side efect  - failed esbrit due to sde effects marvh 2022  -Last echo 2018  -Last high-res CT March 2021. -> Nov 2022 stable between 2  She is also on Breo  HPI KATHRINE RIEVES 72 y.o. -returns for  follow-up.  In this visit she is categorically saying that she is significantly worse.  This point in particular relationship to dyspnea on exertion.  Her weight itself is stable.  She is currently on 5 mg prednisone per day [last time I thought I increased it to 10 mg/day].  Nevertheless she says other interim changes is that she has had slow weight gain.  BMI is 30.  She started semaglutide for hemoglobin A 1.C 6.5.  Is also to help weight.  She was also after seeing Dr. Blenda Nicely for her cough and significant amount of anti-PPI therapy.  She said this made her diarrhea worse.  She then reduced it and currently is taking omeprazole 20 mg/day in the daytime and 40 mg at night and in between Pepcid as well.  She stopped the gabapentin.  She feels after reducing the PPI the diarrhea is improved.  The cough is not as bad as it was even after stopping gabapentin but she continues to be fatigued.  She complains of bloating.  She feels her stomach is full.  She feels acid reflux is worse.  Acid reflux is worse after reducing the dose of H2 blockade.  She also has early satiety.  She also states when she coughs she vomits.  She feels she needs to see Dr. Ethlyn Gallery.  I have messaged him and he hasagreed for him or his PA to see her soon.  Nevertheless symptoms are significantly worse as seen on the symptom score below.  She is also reporting increased tachycardia with exertion and apparently Dr. Percival Spanish adjusted her beta-blocker.  She has upcoming appointment with them.    She says that at times when she desaturates easily at home even for minimal exertion.  We walked her today with a forehead probe and she actually did much better than expected going down to 89% only at the end of 3 labs [this is actually an improvement from the recent past versus stability].  She might have an erratic finger pulse ox probe at home.   Her pulmonary function shows a significant decline in DLCO.  Last echocardiogram 2018.  Last  high-resolution CT chest March 2021.  Never had right heart catheterization.  Hemoglobin normal 14.7 g% in August 2022.           OV 10/21/2021  Subjective:  Patient ID: Rhonda [name], female , DOB: June 29, 1951 , age 57 y.o. , MRN: [id number] , ADDRESS: [address] [id number] PCP Hayden Rasmussen, MD Patient Care Team: Hayden Rasmussen, MD as PCP - General (Family Medicine) Minus Breeding, MD as PCP - Cardiology (Cardiology) Brand Males, MD as Consulting Physician (Pulmonary Disease) Verl Blalock, Marijo Conception, MD (Inactive) (Cardiology) Kennith Center, RD as Dietitian (Family Medicine) Minus Breeding, MD as Consulting Physician (Cardiology)  This Provider for this visit: Treatment Team:  Attending Provider: Brand Males, MD  Type of visit: Video Circumstance: COVID-19 national emergency Identification of patient SAKIYA STEPKA with 10/29/1951 and MRN [id number] - 2 person identifier Risks: Risks, benefits, limitations of telephone visit explained. Patient understood and verbalized agreement to proceed Anyone else on call: no Patient location:  her home This provider location: [address]; [place]; [place] [address]. [facility]. [phone]    10/21/2021 -     Follow-up chronic hypersensitive pneumonitis on prednisone 5 mg/day and also pirfenidone submaximal dose of 2 pills 3 times daily since October 2021.  On ILD-pro registry protocol - failed ofev due to side efect  - failed esbrit due to sde effects marvh 2022  -Last echo 2018  -Last high-res CT March 2021.  She is also on Breo HPI JAXSON KEENER 72 y.o. -returns for follow-up via video visit to discuss test results.  She says currently she is just doing PPI lower dose 20 mg omeprazole in the daytime and famotidine at night.  With this her diarrhea is better.  Also bloating is better.  Correlating with this for she feels her lungs are stable.  She does say  that even now when she bends down she can desaturate.  She is not using oxygen at night.  Her primary care is testing her over no at night to see if she really needs it at night.  Nevertheless she feels somewhat better.  She had echocardiogram because of concern of pulmonary hypertension and there is no evidence of pulmonary hypertension.  She had high-resolution CT chest which the radiologist was described as minimally progressive since 18 months earlier in March 2021.  She had pulmonary function test that shows the FVC to be stable in the last few to several months but the DLCO to show slight decline.  Nevertheless the pattern at least compared to 2 years ago is 1 of progressive ILD on the pulmonary function test.  Certainly the hypoxia with easy exertion compared to a few years ago or a year ago is also  a sign that her ILD is getting worse.  She is likely stable or stabilized in the last few to several months.  We discussed going challenge again with nintedanib or pirfenidone but she is not interested.  We discussed doing CellCept and discussed the side effect profile of this.  She is not interested.  We discussed transplant referral for evaluation now that she is progressing so she can get plugged in and have a good conversation.  She is reflected on this from the last visit and she is not interested  We discussed the acid reflux controlled she feels she is at a good place right now with good acid reflux controlled with PPI in the morning and H2 blockade at night.  She has seen GI and at this point I believe they are not going to do pH probe.  I reviewed the note.  She continues on Breo and prednisone 5 mg/day which she plans to continue.  She has heard about the promedior trial with IV Pnetraxin for IPF.  This might be a study for non-- IPF progressive phenotype.  She prefers to do the study because it bypasses the GI route and it is an IV study.  We do not know if the study is going to happen.  We do  not know if you are going to be a site.  If these things all get aligned then maybe we could consider that.        OV 07/21/2022  Subjective:  Patient ID: Rhonda [name], female , DOB: 1951/04/21 , age 72 y.o. , MRN: [id number] , [address]ESS: [address] [id number] PCP Hayden Rasmussen, MD Patient Care Team: Hayden Rasmussen, MD as PCP - General (Family Medicine) Minus Breeding, MD as PCP - Cardiology (Cardiology) Brand Males, MD as Consulting Physician (Pulmonary Disease) Verl Blalock, Marijo Conception, MD (Inactive) (Cardiology) Kennith Center, RD as Dietitian (Family Medicine) Minus Breeding, MD as Consulting Physician (Cardiology)  This Provider for this visit: Treatment Team:  Attending Provider: Brand Males, MD  She is also on [facility]  07/21/2022 -   Chief Complaint  Patient presents with   Follow-up    PFT performed today.  Pt states she has been doing good since last visit.     HPI CHABELI BARSAMIAN 72 y.o. -returns for follow-up.  Overall in the summer months she is doing well.  She went to the [place] in [place].  In the areas where [place] was [place] and clean air she was able to walk down the beach 200 steps and including climb up with the oxygen without desaturation and feeling good.  But when she got to [facility] where there was pollution she started having cough.  She currently feels really good.  She feels well symptom scores are improved.  Walking desaturation test is also improved.  She attributes this to some weight loss and overall healthy living.  She is on Ozempic that is helping her lose weight.  She is swimming.  She does have some cough recently for the last week because of allergies.  Her dog Ival Bible passed away.  Therefore she is taking care of the other dog and this is increasing her allergies and cough but still overall symptoms are improved.  She continues on prednisone 5 mg/day.  Currently she not  interested in any prednisone burst.  Social: She is expecting to be a grandmother in a few months.  OV 10/27/2022  Subjective:  Patient ID: Rhonda [name], female , DOB: [birth year] , age 49 y.o. , MRN: [id number] , [address]: [address] [id number] PCP Hayden Rasmussen, MD Patient Care Team: Hayden Rasmussen, MD as PCP - General (Family Medicine) Minus Breeding, MD as PCP - Cardiology (Cardiology) Brand Males, MD as Consulting Physician (Pulmonary Disease) Verl Blalock, Marijo Conception, MD (Inactive) (Cardiology) Kennith Center, RD as Dietitian (Family Medicine) Minus Breeding, MD as Consulting Physician (Cardiology)  This Provider for this visit: Treatment Team:  Attending Provider: Brand Males, MD    10/27/2022 -   Chief Complaint  Patient presents with   Acute Visit    Pt has been having complaints of increased SOB and feels like she is having a flare up. Pt states she took zpak and prednisone about a month ago. States she has had some desats in oxygen levels.     HPI CENA DOUTY 72 y.o. -presents for follow-up.  This is an acute visit.  She is now a grandmother.  The granddaughter's name is Drue Stager.  She is pretty excited about that.  However she is having deterioration in her symptoms.  She says when she saw me in August 2023 she was actually quite well but she thought looking back she might of started with a cough.  She did travel [place] but then starting September 2023 she started having worsening cough which typically happens in the fall with the leaves get dry.  I prescribed 8-10-day prednisone burst along with Z-Pak.  She states the prednisone always helps her.  However she feels that the prednisone I prescribed was not probably strong enough.  Since then she is having a deterioration in the cough.  She also feels more fatigued.  There is white sputum coming with this.  She is also having worsening shortness of breath for the last 2 or 3 weeks.  She  feels this time the shortness of breath is different and is the more dominant symptoms.  Usually cough is the more dominant symptoms.  The deterioration in shortness of breath really suddenly in the last few weeks.  There are some associated exertional chest pain.  She did see Dr. Percival Spanish recently.  I reviewed his note.  She has had a previous normal stress test so he felt reassured this was not cardiac.  He did contemplate stopping the metoprolol given her "reactive airway disease" [she has hypersensitivity pneumonitis and can occasionally wheeze but I did leave her on metoprolol being a better once because of the beneficial effects on her heart rate with exertion].  Her symptom score showed deterioration  Her walking desaturation test also shows significant deterioration.  Similar to September 2022 but a lot worse.  This the worst its ever been.  There is no fever     OV 12/10/2022  Subjective:  Patient ID: Rhonda [name], female , DOB: [birth year] , age 20 y.o. , MRN: [id number] , [address]ESS: [address] [id number] PCP Hayden Rasmussen, MD Patient Care Team: Hayden Rasmussen, MD as PCP - General (Family Medicine) Minus Breeding, MD as PCP - Cardiology (Cardiology) Brand Males, MD as Consulting Physician (Pulmonary Disease) Verl Blalock, Marijo Conception, MD (Inactive) (Cardiology) Kennith Center, RD as Dietitian (Family Medicine) Minus Breeding, MD as Consulting Physician (Cardiology)  This Provider for this visit: Treatment Team:  Attending Provider: Brand Males, MD      12/10/2022 -   Chief Complaint  Patient presents with  Follow-up    Pft results from yesterday. Pt states that 10 mg of prednisone is helping.      HPI JOSHA BERGSTRESSER 72 y.o. -here for follow-up.  After the last visit she called saying 5 mg of prednisone was not helping her.  She told me this time that at 50 mg of prednisone she feels great but then as she tapers and she gets into 10 mg  prednisone she starts getting symptoms and at 5 mg symptoms are significantly worse.  She then called and she is back up to 10 mg and she feels better with just very mild symptoms.  In fact walking desaturation test is more stable her exam is more stable.  She feels she is got hypersensitive airways.  I did indicate to her that it is possible there is allergic or eosinophilic inflammatory component.  However her blood eosinophils have always been normal.  But then it is Mast by chronic prednisone.  Currently she is on 10 mg of prednisone.  We took a shared decision for her to stay on 10 mg prednisone acknowledging the risk because this seems to be the best balance between symptom relief and quality of life.  Even exercise hypoxemia is better.  She did notice that recently in the last few months perfume particularly bothering her.  But otherwise she is stable.  She plans to revisit with her allergist Dr. Tiajuana Amass   Current pulmonary function test was reviewed and is stable.      01/20/2023 Acute OV : ILD -chronic hypersensitivity pneumonitis and chronic respiratory failure Patient complains over the last 6 months that she has been having waxing and waning of symptoms with increased cough shortness of breath and decreased activity tolerance.  She has been treated with 3 steroid boost.  She says each time she has gotten much better and actually felt the best that she has felt in a long time.  But within a couple weeks of returning to prednisone 5mg  daily her symptoms started to return.  Most recently started noticing that her breathing was not doing as good 3 weeks ago.  Started to have more cough, congestion thick mucus decreased activity tolerance and increased oxygen demands.  Today in the office walk test shows patient is unable to maintain O2 saturations on pulsed oxygen.  Requires continuous flow oxygen. Patient was started on a prednisone burst yesterday at 40 mg to be tapered by 10 mg every 4 days.   She does feel like she is some better since starting on the higher dose of prednisone yesterday.  Chest x-ray today shows chronic ILD changes.  No acute process. She denies any fever, chest, orthopnea, edema, calf pain.  OV 02/11/2023  Subjective:  Patient ID: Rhonda [name], female , DOB: Feb 22, 1951 , age 29 y.o. , MRN: [id number] , [address]ESS: [address] [id number] PCP Hayden Rasmussen, MD Patient Care Team: Hayden Rasmussen, MD as PCP - General (Family Medicine) Minus Breeding, MD as PCP - Cardiology (Cardiology) Brand Males, MD as Consulting Physician (Pulmonary Disease) Verl Blalock, Marijo Conception, MD (Inactive) (Cardiology) Kennith Center, RD as Dietitian (Family Medicine) Minus Breeding, MD as Consulting Physician (Cardiology)  This Provider for this visit: Treatment Team:  Attending Provider: Brand Males, MD   Follow-up chronic hypersensitive pneumonitis on prednisone 5 mg/day and also pirfenidone submaximal dose of 2 pills 3 times daily since October 2021.  On ILD-pro registry protocol - failed ofev due to side efect  -  failed esbrit due to sde effects  March 2022   -Last echo 2018  -Last high-res CT Nov 222 -> mrach 2024: no change  - declind lung tx referral 2023   Lst eCHO Nov 2022  02/11/2023 -   Chief Complaint  Patient presents with   Follow-up    Increase mucous, cough and oxygen drops when she gets down to 10 mg of prednisone symptoms start to come back.  She remains on 10 mg and states it is not holding her.     HPI MYEISHA SANGHA 72 y.o. -returns for follow-up.  She tells me that compared to a year's this year she is requiring more prednisone.  Even when I saw her last time she felt she needed a baseline of 10 mg prednisone.  We did that.  For years she been on 5 mg prednisone.  She started allergy shots recently Imuran does not helping her.  She is needed quite a few prednisone burst and every time she goes to below 20 mg she feels she is  desaturating easily.  In the past it used to be that she could maintain at 5 mg/day.  Then late last year she needed 10 mg low-dose of prednisone per day.  Now she feels even  prednisone 10 mg is not "holding it".  She says when she was on 40 mg prednisone she would climb stairs on room air and the pulse ox would stay at 95% and above.  Now she is on 10 mg/day of prednisone and she is requiring 5 L of portable oxygen to climb up 1 flight of stairs without desaturations.  She is very worried about her disease.  We did get a high-resolution CT chest and there is no no progression but clinically she is not behaving like that.  Last echocardiogram November 2022 and there was no elevated pulmonary artery pressures.  She is willing to get another echocardiogram.     We discussed the fact she is guarding progressive ILD despite what the CT says.  She has got active inflammation.  I indicated to her that we will commit to an intermediate course of prolonged prednisone at higher dose.  She is willing to do that and accept the risk.  Also indicated to her that we would need to add another steroid sparing and other immunomodulatory agent and would need to check safety labs.  We discussed there is no data on Rituxan for this particular issue of chronic HP progressive pulmonary fibrosis but there is data on CellCept.  She does not like the idea that CellCept can cause diarrhea.  We went over the immunosuppressive and long-term cancer risk.  Did indicate to her that benefit outweighs the risk at this point.  She is agreeable to safety labs and be open to CellCept.  She is committing to CellCept.  But there is a possibility a month from now if she is doing well on the higher dose prednisone and handling it well she might feel differently about CellCept.  We again discussed about lung transplant as an option and she is not interested.      SYMPTOM SCALE - ILD Sept 2020 01/30/2020  08/29/2020  01/14/2021 146#  07/08/2021 147# - off esbriet. Sic past week 08/14/2021 149# - pred only. No antifibrotic 10/03/2021 148# - pred 5mg  07/21/2022 137# 10/27/2022   O2 use  o2 with ex o2 withe ex 2L     2LL o2 with ex 2L with ex 2 laps on  RA  Shortness of Breath  0 -> 5 scale with 5 being worst (score 6 If unable to do)         At rest 0 0 1 0 1 0 1 0 1  Simple tasks - showers, clothes change, eating, shaving 01 1 1 1.5 2 1 2 1 2   Household (dishes, doing bed, laundry) 0 3 3 3 4 2  3.5 2 3   Shopping 1 1 1 2 2 1 1 1 2   Walking level at own pace 1 2 2 1 2 2 3 1 2   Walking up Stairs 2 3 3 3 4 3 4 3 4   Total (30-36) Dyspnea Score 5 10 11  10.5 15 9  14.5 8 14   How bad is your cough? 1 3 2 2 2 2 2 2 3   How bad is your fatigue 1 2 2 3 2  0 3 1 2   How bad is nausea  0 0 3 0 0 1 0 0  How bad is vomiting?   0 0 0 0 0 1 0 0  How bad is diarrhea?  00 1 0 0 0 1 0 0  How bad is anxiety?  0 0 0 00 0 2 0 00  How bad is depression  0 2 1 1  0 1 0 0  00 0 0 Simple office walk 185 feet x  3 laps goal with forehead probe 04/13/2018  08/16/2018  12/15/2018  05/09/2019  08/22/2019  01/30/2020  08/29/2020  01/14/2021  07/08/2021  08/14/2021  10/03/2021  07/21/2022  10/27/2022  02/11/2023   O2 used Room air Room air Room air Room air Room air Room air ra ra ra ra ra ra ra   Number laps completed 3 3 3 3 2  stopped at 2 die to hip pain 3 laps - no hip pain following hip surgery 3 3 3  attempted byt did  only 2 3 bu stopped at 2 3 and did all 3 3 Ddi onl 2 las   Comments about pace good Moderate pace Normal, hip bothering     avg pace avg  avg avg avg   Resting Pulse Ox/HR 98% and 73/min 98% and HR 77/min 99% and HR 61/min 98% and HR 70/min 98% 98% and 75/min 97% and 67/mi 94% and 80/min 96% and HR 72 98% and 71 100% and HR 82 98% ad HR 72 100% and HR 79   Final Pulse Ox/HR 91% and 91/min 93% and 92.min 94% and 92/min 93% and 98/min 91%  89% and 93/,imn 88% and 94 89% ad 88.nin 88% at 2nd  lap end HR 92 86% and 96 89% and HR 91 93% and HR  87 84% and HR 104   Desaturated </= 88% no no no no   yes almost yes yes almst no yes   Desaturated <= 3% points yes yes Yes, 5 points Yes, 5 points    Yes, 5 points Yes, 8 points Yes, 12 pints Yes 11 pot 5 pots 16 pont   Got Tachycardic >/= 90/min yes yes yes yes     yes yes yes no    Symptoms at end of test none none Hip pain and very mild dyspnea  Stopped due to hip pain Moderate duyspnea with mask Mild to moderate dyspnea  Severe dyspnea Mild dyspnea Severe dyspnea Very mild dyspneax moderate   Miscellaneous comments x x    No hip pain    Similar to lastime Symptoms out of proportion ? better  worse      PFT     Latest Ref Rng & Units 12/09/2022   11:48 AM 07/21/2022    9:50 AM 01/06/2022    9:51 AM 10/03/2021    8:59 AM 01/14/2021    1:51 PM 11/06/2020   11:03 AM 07/18/2020   10:53 AM  PFT Results  FVC-Pre L 1.53  1.54  1.58  1.48  1.49  1.41  1.47   FVC-Predicted Pre % 66  67  67  63  62  59  62   Pre FEV1/FVC % % 87  91  90  92  92  93  88   FEV1-Pre L 1.32  1.40  1.43  1.37  1.37  1.31  1.30   FEV1-Predicted Pre % 77  81  81  78  77  73  73   DLCO uncorrected ml/min/mmHg 9.64  8.16  8.62  8.92  11.26  9.70  11.19   DLCO UNC% % 59  50  53  55  69  59  68   DLCO corrected ml/min/mmHg 9.41  7.86  8.62  8.92  11.26  10.53  11.19   DLCO COR %Predicted % 58  48  53  55  69  64  68   DLVA Predicted % 81  64  75  78  92  89  90       Modified Six Minute Walk - 12/10/22 1600     Type of O2 used  Room Air    Number of laps completed  3    Lap Pace Brisk    Resting Heartrate 74 bpm    Final Heartrate 98 bpm    Resting Pulse Ox 99 %    Desaturated to <= 3 points Yes    Desaturated to < 88% No    Became tachycardic No    Symptoms  N/A    Was the O2 correction test done? No         HRCT 01/27/2023   CLINICAL DATA:  Interstitial lung disease, recent bronchitis   EXAM: CT CHEST WITHOUT CONTRAST   TECHNIQUE: Multidetector CT imaging of the chest was performed following  the standard protocol without intravenous contrast. High resolution imaging of the lungs, as well as inspiratory and expiratory imaging, was performed.   RADIATION DOSE REDUCTION: This exam was performed according to the departmental dose-optimization program which includes automated exposure control, adjustment of the mA and/or kV according to patient size and/or use of iterative reconstruction technique.   COMPARISON:  10/10/2021   FINDINGS: Cardiovascular: Aortic atherosclerosis. Normal heart size. Three-vessel coronary artery calcifications. No pericardial effusion.   Mediastinum/Nodes: No enlarged mediastinal, hilar, or axillary lymph nodes. Small hiatal hernia. Thyroid gland, trachea, and esophagus demonstrate no significant findings.   Lungs/Pleura: Unchanged moderate pulmonary fibrosis in a pattern without clear apical to basal gradient, featuring extensive irregular peripheral and peribronchovascular interstitial opacity, ground-glass, and septal thickening, with areas of traction bronchiectasis, subpleural bronchiolectasis, and architectural distortion throughout. Lobular air trapping on expiratory phase imaging. Evidence of prior right lung wedge biopsies. No pleural effusion or pneumothorax.   Upper Abdomen: No acute abnormality.   Musculoskeletal: No chest wall abnormality. No acute osseous findings.   IMPRESSION: 1. Unchanged moderate pulmonary fibrosis in a pattern without clear apical to basal gradient, featuring extensive irregular peripheral and peribronchovascular interstitial opacity, ground-glass, and septal thickening, with areas of traction bronchiectasis, subpleural bronchiolectasis, and architectural distortion throughout. Lobular air trapping on expiratory phase  imaging. Findings remain suggestive of an alternative diagnosis (not UIP) per consensus guidelines, chronic fibrotic phase hypersensitivity pneumonitis favored by imaging appearance,  correlate with biopsy findings: Diagnosis of Idiopathic Pulmonary Fibrosis: An Official ATS/ERS/JRS/ALAT Clinical Practice Guideline. [place], Iss 5, [phone], Jul 24 2017. 2. Coronary artery disease.   Aortic Atherosclerosis (ICD10-I70.0).     Electronically Signed   By: Delanna Ahmadi M.D.   On: 01/28/2023 14:25     has a past medical history of Allergy, Arthritis, Asthma, Cataract, Coronary artery calcification seen on CAT scan (08/19/2017), DDD (degenerative disc disease), thoracic, Depression, DOE (dyspnea on exertion), Fatty liver, GERD (gastroesophageal reflux disease), H/O steroid therapy, Heart palpitations (0000000), Helicobacter pylori ab+, Hemorrhoids, Hiatal hernia, High cholesterol, History of chronic bronchitis, History of migraine, History of MRSA infection, Hyperlipidemia, mixed (08/19/2017), Hyperplastic colon polyp (2007), IBS (irritable bowel syndrome), Inguinal hernia, Insulin resistance, Interstitial lung disease ([place]), MVP (mitral valve prolapse), Pneumonia, Pneumonitis, hypersensitivity ([place]), PONV (postoperative nausea and vomiting), Pre-diabetes, Pulmonary fibrosis ([place]), PVC (premature ventricular contraction) (08/19/2017), Rapid heart rate, Squamous cell carcinoma of skin, Tinnitus, right ear, and Vocal cord ulcer.   reports that she has never smoked. She has never used smokeless tobacco.  Past Surgical History:  Procedure Laterality Date   66 HOUR [place] STUDY N/A 02/21/2018   Procedure: 24 HOUR PH STUDY;  Surgeon: Mauri Pole, MD;  Location: [facility];  Service: Endoscopy;  Laterality: N/A;   BREAST BIOPSY Right 2009   BIOPSY, pt denies   BREAST BIOPSY Left 2003   Benign    CATARACT EXTRACTION Left    CESAREAN SECTION     COLONOSCOPY     ESOPHAGEAL MANOMETRY N/A 02/21/2018   Procedure: ESOPHAGEAL MANOMETRY (EM);  Surgeon: Mauri Pole, MD;  Location: [facility];  Service: Endoscopy;  Laterality: N/A;   FOOT FRACTURE  SURGERY  2006 or 2007   right   HYMENECTOMY     LUNG BIOPSY  09/28/2012   Procedure: LUNG BIOPSY;  Surgeon: Melrose Nakayama, MD;  Location: [facility];  Service: Thoracic;  Laterality: N/A;  lung biopsies tims three   SQUAMOUS CELL CARCINOMA EXCISION Left    left arm   TOTAL HIP ARTHROPLASTY Right 09/10/2015   Procedure: RIGHT TOTAL HIP ARTHROPLASTY ANTERIOR APPROACH;  Surgeon: Paralee Cancel, MD;  Location: [facility];  Service: Orthopedics;  Laterality: Right;   TOTAL HIP ARTHROPLASTY Left 10/03/2019   Procedure: TOTAL HIP ARTHROPLASTY ANTERIOR APPROACH;  Surgeon: Paralee Cancel, MD;  Location: [facility];  Service: Orthopedics;  Laterality: Left;  70 mins   TUBAL LIGATION     UPPER GI ENDOSCOPY     VIDEO ASSISTED THORACOSCOPY  09/28/2012   Procedure: VIDEO ASSISTED THORACOSCOPY;  Surgeon: Melrose Nakayama, MD;  Location: [facility];  Service: Thoracic;  Laterality: Right;   VIDEO BRONCHOSCOPY  11/19/2011   Procedure: VIDEO BRONCHOSCOPY WITH FLUORO;  Surgeon: Brand Males, MD;  Location: [facility];  Service: Endoscopy;;    Allergies  Allergen Reactions   Atorvastatin Other (See Comments)    Leg pain   Betadine [Povidone Iodine] Other (See Comments)    blisters   Codeine Nausea And Vomiting   Garlic Diarrhea   Hydrocodone Nausea And Vomiting   Macrobid [Nitrofurantoin] Other (See Comments)   Ofev [Nintedanib] Other (See Comments)    Abdominal pain   Onion Diarrhea   Other Other (See Comments)   Rosuvastatin Other (See Comments)    Leg pain   Shellfish Allergy  Nausea And Vomiting   Sulfa Antibiotics    Sulfonamide Derivatives Other (See Comments)    headaches    Immunization History  Administered Date(s) Administered   Fluad Quad(high Dose 65+) 07/17/2019   Hepatitis A 05/23/2010   Hepatitis B 06/23/2006   Influenza Split 08/11/2012, 08/14/2017   Influenza Whole 09/08/2011   Influenza, High Dose Seasonal PF 08/24/2016, 07/24/2017, 08/31/2018, 08/15/2020, 09/26/2020,  09/12/2021, 08/24/2022   Influenza,inj,Quad PF,6+ Mos 09/07/2013   Influenza-Unspecified 09/26/2014, 07/27/2015   MMR 05/23/2010   Moderna Sars-Covid-2 Vaccination 07/30/2021, 01/01/2022   PFIZER(Purple Top)SARS-COV-2 Vaccination 12/16/2019, 01/08/2020, 07/18/2020, 08/11/2022   Pneumococcal Conjugate-13 03/15/2014   Pneumococcal Polysaccharide-23 01/11/2018, 09/26/2020, 08/26/2021   Respiratory Syncytial Virus Vaccine,Recomb Aduvanted(Arexvy) 07/28/2022   Td 02/22/2003   Tdap 10/28/2012   Zoster Recombinat (Shingrix) 02/15/2017, 05/17/2017   Zoster, Live 12/04/2010    Family History  Problem Relation Age of Onset   Asthma Mother    Osteoarthritis Mother    Dementia Mother 62   Lymphoma Father    Diabetes Father    Hypertension Sister    Heart disease Sister    Kidney disease Sister    Allergic rhinitis Daughter    Ovarian cancer Maternal Aunt    Breast cancer Paternal Aunt    Breast cancer Paternal Aunt    Emphysema Maternal Grandmother    Asthma Maternal Grandmother    Lung disease Maternal Grandfather    Bone cancer Paternal Grandfather    Breast cancer Cousin    Breast cancer Cousin    Breast cancer Cousin    Colon cancer Neg Hx    Angioedema Neg Hx    Eczema Neg Hx    Immunodeficiency Neg Hx    Urticaria Neg Hx      Current Outpatient Medications:    acetaminophen (TYLENOL) 500 MG tablet, Tylenol Extra Strength, Disp: , Rfl:    albuterol (PROAIR HFA) 108 (90 Base) MCG/ACT inhaler, Inhale 2 puffs into the lungs every 4 (four) hours as needed for wheezing. Or coughing spells.  You may use 2 Puffs 5-10 minutes before exercise., Disp: 1 Inhaler, Rfl: 3   Alpha-D-Galactosidase (BEANO PO), Take 1 tablet by mouth as needed (if eating onion or garlic). , Disp: , Rfl:    amoxicillin-clavulanate (AUGMENTIN) 875-125 MG tablet, Take 1 tablet by mouth 2 (two) times daily., Disp: 14 tablet, Rfl: 0   Ascorbic Acid (VITAMIN C) 1000 MG tablet, Take 1,000 mg by mouth daily., Disp:  , Rfl:    ASHWAGANDHA PO, Take 2 capsules by mouth daily., Disp: , Rfl:    azelastine (ASTELIN) 0.1 % nasal spray, Place 1 spray into both nostrils at bedtime., Disp: , Rfl:    Bacillus Coagulans-Inulin (ALIGN PREBIOTIC-PROBIOTIC PO), Take 2 tablets by mouth daily at 6 (six) AM. chewable, Disp: , Rfl:    BREO ELLIPTA 100-25 MCG/ACT AEPB, Inhale 1 puff into the lungs daily., Disp: 180 each, Rfl: 3   Calcium Carb-Cholecalciferol (CALCIUM 500 + D PO), Take 2 tablets by mouth daily at 6 (six) AM., Disp: , Rfl:    chlorpheniramine (CHLOR-TRIMETON) 4 MG tablet, Take 2 mg by mouth 2 (two) times daily. Takes half tablet twice daily, Disp: , Rfl:    cholecalciferol (VITAMIN D3) 25 MCG (1000 UNIT) tablet, Take 1,000 Units by mouth every other day., Disp: , Rfl:    dicyclomine (BENTYL) 10 MG capsule, Take 1 capsule by mouth daily as needed., Disp: , Rfl:    diltiazem (CARDIZEM CD) 120 MG 24 hr capsule, Take 1  capsule (120 mg total) by mouth daily., Disp: 90 capsule, Rfl: 3   diphenhydrAMINE-PE-APAP (DELSYM COUGH/COLD NIGHT TIME) 12.5-5-325 MG/10ML LIQD, Take by mouth as needed., Disp: , Rfl:    EPINEPHrine 0.3 mg/0.3 mL IJ SOAJ injection, Inject into the muscle., Disp: , Rfl:    ezetimibe (ZETIA) 10 MG tablet, Take 1 tablet (10 mg total) by mouth daily. Please keep upcoming appt for future refills. Thank you., Disp: 90 tablet, Rfl: 3   fluticasone (FLONASE) 50 MCG/ACT nasal spray, Spray 2 sprays into each nostril every day., Disp: 48 g, Rfl: 1   lactase (LACTAID) 3000 units tablet, Take by mouth., Disp: , Rfl:    levocetirizine (XYZAL) 5 MG tablet, Take 5 mg by mouth at bedtime., Disp: , Rfl:    metoprolol succinate (TOPROL XL) 25 MG 24 hr tablet, Take 1 tablet (25 mg total) by mouth daily. (Patient taking differently: Take 12.5 mg by mouth daily.), Disp: 90 tablet, Rfl: 1   montelukast (SINGULAIR) 10 MG tablet, TAKE 1 TABLET BY MOUTH EVERYDAY AT BEDTIME, Disp: 90 tablet, Rfl: 2   Multiple Vitamin  (MULTIVITAMIN) tablet, Take 1 tablet by mouth daily., Disp: , Rfl:    nystatin-triamcinolone (MYCOLOG II) cream, nystatin-triamcinolone 100,000 unit/g-0.1 % topical cream, Disp: , Rfl:    omeprazole (PRILOSEC OTC) 20 MG tablet, Take 20 mg by mouth daily., Disp: , Rfl:    OXYGEN, Inhale into the lungs. Up to 6 liters as needed during the day and nite time use on/off, Disp: , Rfl:    OZEMPIC, 0.25 OR 0.5 MG/DOSE, 2 MG/1.5ML SOPN, Inject into the skin., Disp: , Rfl:    pravastatin (PRAVACHOL) 40 MG tablet, Take 1 tablet (40 mg total) by mouth every evening., Disp: 90 tablet, Rfl: 1   predniSONE (DELTASONE) 5 MG tablet, Take 1 tablet (5 mg total) by mouth daily with breakfast., Disp: 90 tablet, Rfl: 3   Sodium Chloride, Inhalant, 7 % NEBU, Use per nebulizer once a day to help clear sputum, Disp: 120 mL, Rfl: 3   nitroGLYCERIN (NITROSTAT) 0.4 MG SL tablet, Place 1 tablet (0.4 mg total) under the tongue every 5 (five) minutes as needed for chest pain., Disp: 25 tablet, Rfl: 12      Objective:   Vitals:   02/11/23 1406  BP: (!) 106/50  Pulse: 88  Temp: 98.4 F (36.9 C)  TempSrc: Oral  SpO2: 92%  Weight: 138 lb 6.4 oz (62.8 kg)  Height: 5' (1.524 m)    Estimated body mass index is 27.03 kg/m as calculated from the following:   Height as of this encounter: 5' (1.524 m).   Weight as of this encounter: 138 lb 6.4 oz (62.8 kg).  @WEIGHTCHANGE @  [employer]   02/11/23 1406  Weight: 138 lb 6.4 oz (62.8 kg)     Physical Exam    General: No distress. Looks well Neuro: Alert and Oriented x 3. GCS 15. Speech normal Psych: Pleasant Resp:  Barrel Chest - no.  Wheeze - yes, Crackles - yes, No overt respiratory distress CVS: Normal heart sounds. Murmurs - no Ext: Stigmata of Connective Tissue Disease - no HEENT: Normal upper airway. PEERL +. No post nasal drip        Assessment:       ICD-10-CM   1. Hypersensitivity pneumonitis ([place])  J67.9     2. ILD (interstitial lung  disease) ([place])  J84.9     3. Hiatal hernia  K44.9     4. Current chronic use of  systemic steroids  Z79.52     5. Chronic respiratory failure with hypoxia ([facility])  J96.11          Plan:     Patient Instructions     ICD-10-CM   1. Hypersensitivity pneumonitis ([place])  J67.9     2. ILD (interstitial lung disease) ([place])  J84.9     3. Hiatal hernia  K44.9     4. Current chronic use of systemic steroids  Z79.52          Seems inflmmatory component is making you require prednisone (more in winter) and now in fall making me quite concerned that ILD is progressive. Also concerned that you are developing pulmonary hypertension. Too bad not better despite starting allergy shots though only recently  Plan  - check blood Quantiferon gold, G6PD, Hepatitis Virus Panel, cbc with diff, bmet, lft. BNP 02/11/2023 - check ECHO next weeks - escalate prednisone  - 40mg  daily x 2 weeks and then 30mg  daily x 2 weeks and then stay at 20mg  per day - start cellcept - initially 500mg  twice daily  with bactrim prophylaxis  - before you start await safety labs  - refer pharmacist [facility] for safety reivew and start - no clinical trial for now -continue to  control Acid reflux well - use o2 to keep pusle ox > 88% (night and exercise)  - respect no interest in transplant  Followup - spirometry and dlco in 5-6 weeks  - 30 min visit in 5-6 weeks with DR Chase Caller   ( Level 05 visit: Estb 40-54 min in  visit type: on-site physical face to visit  in total care time and counseling or/and coordination of care by this undersigned MD - Dr Brand Males. This includes one or more of the following on this same day 02/11/2023: pre-charting, chart review, note writing, documentation discussion of test results, diagnostic or treatment recommendations, prognosis, risks and benefits of management options, instructions, education, compliance or risk-factor reduction. It excludes time spent by the [place] or office staff in  the care of the patient. Actual time 40 min)     SIGNATURE    Dr. Brand Males, M.D., F.C.C.P,  Pulmonary and Critical Care Medicine Staff Physician, [place] Director - Interstitial Lung Disease  Program  [facility] [place] [facility] [place], [place], [address]  Pager: [phone], If no answer or between  15:00h - 7:00h: call [phone] Telephone: [phone]  2:45 PM 02/11/2023

## 2023-02-12 ENCOUNTER — Other Ambulatory Visit (INDEPENDENT_AMBULATORY_CARE_PROVIDER_SITE_OTHER): Payer: Medicare PPO

## 2023-02-12 DIAGNOSIS — J849 Interstitial pulmonary disease, unspecified: Secondary | ICD-10-CM | POA: Diagnosis not present

## 2023-02-12 DIAGNOSIS — Z5181 Encounter for therapeutic drug level monitoring: Secondary | ICD-10-CM | POA: Diagnosis not present

## 2023-02-12 LAB — CBC WITH DIFFERENTIAL/PLATELET
Basophils Absolute: 0.1 10*3/uL (ref 0.0–0.1)
Basophils Relative: 0.5 % (ref 0.0–3.0)
Eosinophils Absolute: 0.2 10*3/uL (ref 0.0–0.7)
Eosinophils Relative: 1.3 % (ref 0.0–5.0)
HCT: 45.2 % (ref 36.0–46.0)
Hemoglobin: 14.7 g/dL (ref 12.0–15.0)
Lymphocytes Relative: 8.2 % — ABNORMAL LOW (ref 12.0–46.0)
Lymphs Abs: 1.3 10*3/uL (ref 0.7–4.0)
MCHC: 32.6 g/dL (ref 30.0–36.0)
MCV: 86.3 fl (ref 78.0–100.0)
Monocytes Absolute: 0.6 10*3/uL (ref 0.1–1.0)
Monocytes Relative: 3.6 % (ref 3.0–12.0)
Neutro Abs: 13.7 10*3/uL — ABNORMAL HIGH (ref 1.4–7.7)
Neutrophils Relative %: 86.4 % — ABNORMAL HIGH (ref 43.0–77.0)
Platelets: 244 10*3/uL (ref 150.0–400.0)
RBC: 5.23 Mil/uL — ABNORMAL HIGH (ref 3.87–5.11)
RDW: 15.2 % (ref 11.5–15.5)
WBC: 15.8 10*3/uL — ABNORMAL HIGH (ref 4.0–10.5)

## 2023-02-12 LAB — BASIC METABOLIC PANEL
BUN: 17 mg/dL (ref 6–23)
CO2: 25 mEq/L (ref 19–32)
Calcium: 9.4 mg/dL (ref 8.4–10.5)
Chloride: 105 mEq/L (ref 96–112)
Creatinine, Ser: 0.89 mg/dL (ref 0.40–1.20)
GFR: 64.08 mL/min (ref >60.00)
Glucose, Bld: 100 mg/dL — ABNORMAL HIGH (ref 70–99)
Potassium: 3.8 mEq/L (ref 3.5–5.1)
Sodium: 139 mEq/L (ref 135–145)

## 2023-02-12 LAB — BRAIN NATRIURETIC PEPTIDE: Pro B Natriuretic peptide (BNP): 29 pg/mL (ref 0.0–100.0)

## 2023-02-15 ENCOUNTER — Other Ambulatory Visit: Payer: Medicare PPO

## 2023-02-15 NOTE — Telephone Encounter (Signed)
Pharmacy Note  Subjective: Patient called today by [facility] Pulmonary pharmacy team for counseling on mycophenolate (CellCept) for  hypersensitivity pneumonitis . Currently on prednisone taper. She states she was doing really well on low-dose prednisone for an extended period of time up until fall 2023. Now requiring more oxygen and higher prednisone dose.  Objective: CBC    Component Value Date/Time   WBC 15.8 (H) 02/12/2023 1158   RBC 5.23 (H) 02/12/2023 1158   HGB 14.7 02/12/2023 1158   HCT 45.2 02/12/2023 1158   PLT 244.0 02/12/2023 1158   MCV 86.3 02/12/2023 1158   MCH 29.5 10/04/2019 0216   MCHC 32.6 02/12/2023 1158   RDW 15.2 02/12/2023 1158   LYMPHSABS 1.3 02/12/2023 1158   MONOABS 0.6 02/12/2023 1158   EOSABS 0.2 02/12/2023 1158   BASOSABS 0.1 02/12/2023 1158    CMP     Component Value Date/Time   NA 139 02/12/2023 1158   NA 142 12/08/2018 0917   K 3.8 02/12/2023 1158   CL 105 02/12/2023 1158   CO2 25 02/12/2023 1158   GLUCOSE 100 (H) 02/12/2023 1158   BUN 17 02/12/2023 1158   BUN 18 12/08/2018 0917   CREATININE 0.89 02/12/2023 1158   CREATININE 0.91 02/26/2015 1540   CALCIUM 9.4 02/12/2023 1158   PROT 7.5 07/08/2021 1525   PROT 6.5 12/08/2018 0917   ALBUMIN 3.9 07/08/2021 1525   ALBUMIN 3.9 12/08/2018 0917   AST 22 07/08/2021 1525   ALT 15 07/08/2021 1525   ALKPHOS 65 07/08/2021 1525   BILITOT 0.5 07/08/2021 1525   BILITOT 0.4 12/08/2018 0917   GFRNONAA >60 10/04/2019 0216   GFRNONAA 66 02/26/2015 1540   GFRAA >60 10/04/2019 0216   GFRAA 77 02/26/2015 1540    Baseline Immunosuppressant Therapy Labs TB GOLD   Hepatitis Panel    Latest Ref Rng & Units 02/08/2014    2:55 PM  Hepatitis  Hep C Ab NEGATIVE NEGATIVE   HIV1&2 antibody/antigen on 06/02/2017 - non reactive Hepatitis C antibody - non reactive 06/02/2017 Hepatitis B surface antigen - non reactive 06/02/17 Hepatitis B surface antibody - reactive 06/02/2017 Hepatitis B core antibody total - non  reactie 06/02/2017 Hepatitis A antibody total - reacitve 06/02/2017  SPEP    Latest Ref Rng & Units 07/08/2021    3:25 PM  Serum Protein Electrophoresis  Total Protein 6.0 - 8.3 g/dL 7.5    G6PD No results found for: "G6PDH" TPMT No results found for: "TPMT"   Chest-xray:  01/27/23 - Unchanged moderate pulmonary fibrosis in a pattern without clear apical to basal gradient, featuring extensive irregular peripheral and peribronchovascular interstitial opacity, ground-glass, and septal thickening, with areas of traction bronchiectasis, subpleural bronchiolectasis, and architectural distortion throughout. Lobular air trapping on expiratory phase imaging. Findings remain suggestive of an alternative diagnosis (not UIP) per consensus guidelines, chronic fibrotic phase hypersensitivity pneumonitis favored by imaging appearance, correlate with biopsy findings  Contraception: post-menopausal  Assessment/Plan:  Patient was counseled on the purpose, proper use, and adverse effects of mycophenolate including risk of infection, new or reactivation of viral infections, nausea, and headaches.  Reviewed goal of mycophenolate as steroid-sparing agent. Discussed warning of increased risk of development of lymphoma and other malignancies.  Discussed risk of neutropenia and discussed importance of regular labs to monitor blood counts, liver function, and kidney function.  Counseled patient on importance of taking PCP prophylaxis while on mycophenolate.  Counseled patient on purpose, proper use, and adverse effects of sulfamethoxazole/trimethoprim three times a week. Patient  does have sulfa allergy listed but states it was headaches - not hives or significant allergic reaction. Will re-challenge with Bactrim because that is ideal agent for PJP ppx. G6PD ordered - pt to stop by tomorrow for lab.  Provided patient with educational materials and answered all questions.  Patient consented to mycophenolate.    Plan: Pending  baseline labs. Cellcept 500 mg twice daily x 4 weeks If tolerated, increase to Cellcept 1000 mg twice daily.  Start Bactrim DS MWF. Goal Cellcept dose: 1500mg  twice daily (although patient may not be able to tolerate at this dose)  Knox Saliva, PharmD, MPH, BCPS, CPP Clinical Pharmacist (Rheumatology and Pulmonology)

## 2023-02-15 NOTE — Telephone Encounter (Addendum)
Will await resulting of baseline labs. It does not appear that hepatitis serology and G6PD were drawn that day. Able to locate hepatitis serology from 2018 in CareEverywhere. Will still need G6PD and baseline LFTs. Order has been placed correctly today.  HIV1&2 antibody/antigen on 06/02/2017 - non reactive Hepatitis C antibody - non reactive 06/02/2017 Hepatitis B surface antigen - non reactive 06/02/17 Hepatitis B surface antibody - reactive 06/02/2017 Hepatitis B core antibody total - non reactie 06/02/2017 Hepatitis A antibody total - reacitve 06/02/2017

## 2023-02-16 ENCOUNTER — Other Ambulatory Visit (INDEPENDENT_AMBULATORY_CARE_PROVIDER_SITE_OTHER): Payer: Medicare PPO

## 2023-02-16 DIAGNOSIS — J849 Interstitial pulmonary disease, unspecified: Secondary | ICD-10-CM | POA: Diagnosis not present

## 2023-02-16 DIAGNOSIS — Z79899 Other long term (current) drug therapy: Secondary | ICD-10-CM

## 2023-02-16 LAB — HEPATIC FUNCTION PANEL
ALT: 18 U/L (ref 0–35)
AST: 20 U/L (ref 0–37)
Albumin: 3.9 g/dL (ref 3.5–5.2)
Alkaline Phosphatase: 67 U/L (ref 39–117)
Bilirubin, Direct: 0.1 mg/dL (ref 0.0–0.3)
Total Bilirubin: 0.4 mg/dL (ref 0.2–1.2)
Total Protein: 7.3 g/dL (ref 6.0–8.3)

## 2023-02-16 LAB — QUANTIFERON-TB GOLD PLUS
Mitogen-NIL: 6.67 IU/mL
NIL: 0.04 IU/mL
QuantiFERON-TB Gold Plus: NEGATIVE
TB1-NIL: 0 IU/mL
TB2-NIL: 0.01 IU/mL

## 2023-02-17 ENCOUNTER — Telehealth: Payer: Self-pay | Admitting: Internal Medicine

## 2023-02-17 ENCOUNTER — Ambulatory Visit (HOSPITAL_COMMUNITY)
Admission: RE | Admit: 2023-02-17 | Discharge: 2023-02-17 | Disposition: A | Discharge status: Home / Self Care | Payer: Medicare PPO | Source: Ambulatory Visit | Attending: Internal Medicine | Admitting: Internal Medicine

## 2023-02-17 DIAGNOSIS — J9611 Chronic respiratory failure with hypoxia: Secondary | ICD-10-CM

## 2023-02-17 DIAGNOSIS — I472 Ventricular tachycardia, unspecified: Secondary | ICD-10-CM | POA: Insufficient documentation

## 2023-02-17 DIAGNOSIS — I351 Nonrheumatic aortic (valve) insufficiency: Secondary | ICD-10-CM | POA: Diagnosis not present

## 2023-02-17 DIAGNOSIS — Z7952 Long term (current) use of systemic steroids: Secondary | ICD-10-CM

## 2023-02-17 DIAGNOSIS — E785 Hyperlipidemia, unspecified: Secondary | ICD-10-CM | POA: Diagnosis not present

## 2023-02-17 DIAGNOSIS — I340 Nonrheumatic mitral (valve) insufficiency: Secondary | ICD-10-CM | POA: Diagnosis not present

## 2023-02-17 DIAGNOSIS — J849 Interstitial pulmonary disease, unspecified: Secondary | ICD-10-CM | POA: Insufficient documentation

## 2023-02-17 DIAGNOSIS — J679 Hypersensitivity pneumonitis due to unspecified organic dust: Secondary | ICD-10-CM | POA: Insufficient documentation

## 2023-02-17 LAB — ECHOCARDIOGRAM COMPLETE
Area-P 1/2: 3.06 cm2
Calc EF: 63.3 %
S' Lateral: 2.6 cm
Single Plane A2C EF: 65.4 %
Single Plane A4C EF: 63 %

## 2023-02-17 NOTE — Telephone Encounter (Signed)
  D/w patient [place] echo report. BNP and PA pressures normal but RV mildly enlarged. So there is some concern for PAH - WHO group 3. Gave her the results. Explained low-mod prob for [place]. She is scared of cellcept for progressive ILD and would ideally prefer to hold off  We discussed getting  RHC and if PAH + > start tyvaso which might have a direct benefit in ILD as well. She is agreeable to this approach. Willign to do RHC via study protocol called PHINDER. Will have Darius Bump email her the ICF for phinder study I  If no PAH- > then she will consdier Tyvaso v placebo in 305 study for PPF VERSUS Cellcept      Exercuse hypoxemia is improving on pred 40mg  per day per her    SIGNATURE    Dr. Brand Males, M.D., F.C.C.P,  Pulmonary and Critical Care Medicine Staff Physician, [place] Director - Interstitial Lung Disease  Program  Medical Director - [facility] [place] at [place], [place], [address]   Pager: [phone], If no answer  -[place] or Try [phone] Telephone (clinical office): [phone] Telephone (research): [phone]  3:19 PM 02/17/2023    IMPRESSIONS     1. Left ventricular ejection fraction, by estimation, is 55 to 60%. The  left ventricle has normal function. The left ventricle has no regional  wall motion abnormalities. Left ventricular diastolic parameters are  consistent with Grade I diastolic  dysfunction (impaired relaxation).   2. Right ventricular systolic function is normal. The right ventricular  size is mildly enlarged. There is normal pulmonary artery systolic  pressure. The estimated right ventricular systolic pressure is [id number] mmHg.   3. The mitral valve is grossly normal. Trivial mitral valve  regurgitation.   4. The aortic valve is tricuspid. There is mild calcification of the  aortic valve. There is mild thickening of the aortic  valve. Aortic valve  regurgitation is trivial. Aortic valve sclerosis is present, with no  evidence of aortic valve stenosis.   5. The inferior vena cava is normal in size with greater than 50%  respiratory variability, suggesting right atrial pressure of 3 mmHg.   Comparison(s): No significant change from prior study.

## 2023-02-17 NOTE — Progress Notes (Signed)
Echocardiogram 2D Echocardiogram has been performed.  Rhonda [name] 02/17/2023, 8:44 AM

## 2023-02-18 LAB — GLUCOSE 6 PHOSPHATE DEHYDROGENASE: G-6PDH: 16.7 U/g Hgb (ref 7.0–20.5)

## 2023-02-22 NOTE — Telephone Encounter (Signed)
Labs resulted. LFTs wnl and G6PD wnl  Per most recent phone note from 02/17/23  - patient is being evaluated for Tyvaso potentially. Still pending RHC.  Closing encounter  Knox Saliva, PharmD, MPH, BCPS, CPP Clinical Pharmacist (Rheumatology and Pulmonology)

## 2023-03-01 ENCOUNTER — Telehealth: Payer: Self-pay | Admitting: Internal Medicine

## 2023-03-01 ENCOUNTER — Encounter: Payer: Medicare PPO | Admitting: Internal Medicine

## 2023-03-01 ENCOUNTER — Other Ambulatory Visit: Payer: Self-pay | Admitting: Pulmonary Disease

## 2023-03-01 DIAGNOSIS — J849 Interstitial pulmonary disease, unspecified: Secondary | ICD-10-CM

## 2023-03-01 DIAGNOSIS — Z006 Encounter for examination for normal comparison and control in clinical research program: Secondary | ICD-10-CM

## 2023-03-01 NOTE — Research (Signed)
[facility]It[facility]:  "PHINDER: Pulmonary Hypertension Screening in Patients with Interstitial Lung Disease for Earlier Detection"  Primary Endpoint  To prospectively evaluate screening strategies for pulmonary hypertension (PH) in patients with ILD in an effort to promote awareness and encourage screening for PH in this patient population. Results from this study will be used to identify and weigh specific clinical parameters based on their prognostic significance for right heart catheterization (RHC)-confirmed PH in patients with ILD.  Study Code: GMS-PH-001 Protocol Edition: Amendment 1 dated 16 Feb 2022 Sponsor Name: [employer] Primary Investigator: Dr. Susy Frizzle Hunsucker  Protocol Version for 03/01/2023 date of 16 Feb 2022  Consent Version for 03/01/2023 date of 06 Jul 2022   Lab Manual: v1 dated 30 Jan 2022   Key Inclusion Criteria:  Patients are eligible if they have a diagnosis of ILD based on computed tomography imaging, including:  Idiopathic interstitial pneumonia, including idiopathic pulmonary fibrosis  Connective tissue disease-associated ILD with forced vital capacity (FVC) <70%  Hypersensitivity pneumonitis  Scleroderma-related ILD  Autoimmune ILD  Nonspecific interstitial pneumonia  Occupational lung disease  Combined pulmonary fibrosis and emphysema  Additionally, patients must meet a total of 2 or more criteria from at least 2 distinct categories below (ie, Category 1, Category 2, Category 3) based on the Investigator's clinical judgment, within 180 days of Screening.  Category 1: Clinical tests: a) Pulmonary function tests (PFTs)  meeting specified study guidelines for DLCO. b) Historical HRCT with specified findings consistent with increased pulmonary arterial pressure.  Category 2: Symptoms, oxygenation, exercise capacity, and brain natriuretic peptide/N-terminal pro-brain natriuretic peptide indicative of worsening status. Category 3: Echocardiography Specified  findings of increased RV systolic pressure or RV enlargement.  If a patient does not have a historical PFT, HRCT, or echocardiogram needed to assess eligibility for the study, these procedures will be performed as part of study screening.  Key Exclusion Criteria:  Patients are excluded if any of the following criteria apply:  Prior RHC with mean pulmonary artery pressure >20 mmHg  Currently on an FDA-approved pulmonary arterial hypertension medication  Diagnosed with chronic obstructive pulmonary disease  Uncontrolled or untreated sleep apnea  Pulmonary embolism within the past 3 months  History of ischemic heart disease or left-sided myocardial dysfunction within 12 months of Screening, defined as left ventricular ejection fraction <40% or pulmonary capillary wedge pressure >15 mmHg  Safety Data: Not Applicable.     [facility] [place] Clinical Research Coordinator note:   This visit for Subject Rhonda [name] with DOB: 1951/10/13 on 03/01/2023 for the above protocol is Visit/Encounter # Screening  and is for purpose of research.   The consent for this encounter is under Protocol Version Amendment 1, Consent Version 06 Jul 2022 and  is currently IRB approved.   Subject expressed continued interest and consent in continuing as a study subject. Subject confirmed that there was    no change in contact information (e.g. address, telephone, email). Subject thanked for participation in research and contribution to science. In this visit 03/01/2023 the subject will be evaluated by Sub-Investigator named Dr. Marchelle Gearing. This research coordinator has verified that the above investigator is up to date with his/her training logs.   The Subject was informed that the PI  continues to have oversight of the subject's visits and course through relevant discussions, reviews, and also specifically of this visit by routing of this note to the PI.   1. This visit is a key visit of screening. The PI is not  available  for this visit.   Because the PI is NOT available, the Sub-Investigator reported and CRC has confirmed that the PI has discussed the visit a-priori with the Sub-Investigator.   Signed by  Christell Constant MD  Clinical Research Coordinator [facility]  [place], [place] 11:21 AM 03/01/2023

## 2023-03-01 NOTE — Telephone Encounter (Signed)
Saw patinet 03/01/2023 during research visit. She said she has breo but [employer] thinks it is generic and offered breo (? Branded) as option and symbicort and wixela as other options  Plan  - plse double check if she can have breo 100 itself -> if not she can do symbicor 80/4,5 at 2 puff bid

## 2023-03-02 ENCOUNTER — Telehealth (HOSPITAL_COMMUNITY): Payer: Self-pay

## 2023-03-02 NOTE — Telephone Encounter (Signed)
She just left another message

## 2023-03-02 NOTE — Telephone Encounter (Signed)
Called patient trying to schedule RHC as part of the [facility] study. If she calls back please let me know

## 2023-03-02 NOTE — Telephone Encounter (Signed)
She left a message on triage line. She thinks it is about transportation.

## 2023-03-03 ENCOUNTER — Other Ambulatory Visit (HOSPITAL_COMMUNITY): Payer: Self-pay

## 2023-03-03 ENCOUNTER — Encounter (HOSPITAL_COMMUNITY): Payer: Self-pay

## 2023-03-03 DIAGNOSIS — Z006 Encounter for examination for normal comparison and control in clinical research program: Secondary | ICD-10-CM

## 2023-03-03 NOTE — Telephone Encounter (Signed)
Spoke with patient RHC scheduled and instructions given to patient. Letter of instructions also mailed

## 2023-03-04 ENCOUNTER — Encounter: Payer: Self-pay | Admitting: Internal Medicine

## 2023-03-04 NOTE — Telephone Encounter (Signed)
Called pt about the info per MR in regards to inhalers. Pt said she had information at home that stated what insurance was wanting to have prescribed with the inhalers and she said when she was back at home she would send a mychart message with this info.

## 2023-03-05 MED ORDER — FLUTICASONE FUROATE-VILANTEROL 100-25 MCG/ACT IN AEPB: 1.0000 | INHALATION_SPRAY | Freq: Every day | RESPIRATORY_TRACT | 3 refills | Status: DC

## 2023-03-08 ENCOUNTER — Other Ambulatory Visit: Payer: Self-pay | Admitting: Internal Medicine

## 2023-03-08 ENCOUNTER — Ambulatory Visit: Payer: Medicare PPO

## 2023-03-08 ENCOUNTER — Encounter: Payer: Medicare PPO | Admitting: Pulmonary Disease

## 2023-03-08 DIAGNOSIS — Z006 Encounter for examination for normal comparison and control in clinical research program: Secondary | ICD-10-CM

## 2023-03-08 DIAGNOSIS — J849 Interstitial pulmonary disease, unspecified: Secondary | ICD-10-CM

## 2023-03-08 DIAGNOSIS — J679 Hypersensitivity pneumonitis due to unspecified organic dust: Secondary | ICD-10-CM

## 2023-03-08 LAB — PULMONARY FUNCTION TEST
DL/VA % pred: 65 %
DL/VA: 2.78 ml/min/mmHg/L
DLCO cor % pred: 48 %
DLCO cor: 7.81 ml/min/mmHg
DLCO unc % pred: 50 %
DLCO unc: 8.11 ml/min/mmHg
FEF 25-75 Pre: 2.36 L/sec
FEF2575-%Pred-Pre: 165 %
FEV1-%Pred-Pre: 80 %
FEV1-Pre: 1.35 L
FEV1FVC-%Pred-Pre: 124 %
FEV6-%Pred-Pre: 67 %
FEV6-Pre: 1.44 L
FEV6FVC-%Pred-Pre: 104 %
FVC-%Pred-Pre: 64 %
FVC-Pre: 1.45 L
Pre FEV1/FVC ratio: 93 %
Pre FEV6/FVC Ratio: 99 %
RV % pred: 70 %
RV: 1.41 L
TLC % pred: 70 %
TLC: 3.02 L

## 2023-03-09 ENCOUNTER — Telehealth: Payer: Self-pay | Admitting: Internal Medicine

## 2023-03-09 ENCOUNTER — Other Ambulatory Visit: Payer: Self-pay | Admitting: Pulmonary Disease

## 2023-03-09 DIAGNOSIS — J849 Interstitial pulmonary disease, unspecified: Secondary | ICD-10-CM

## 2023-03-09 DIAGNOSIS — Z006 Encounter for examination for normal comparison and control in clinical research program: Secondary | ICD-10-CM

## 2023-03-09 NOTE — Telephone Encounter (Signed)
No need for PFT because most recent PFT was in mid April 2024

## 2023-03-09 NOTE — Research (Unsigned)
TItle:  "PHINDER: Pulmonary Hypertension Screening in Patients with Interstitial Lung Disease for Earlier Detection"  Primary Endpoint  To prospectively evaluate screening strategies for pulmonary hypertension (PH) in patients with ILD in an effort to promote awareness and encourage screening for PH in this patient population. Results from this study will be used to identify and weigh specific clinical parameters based on their prognostic significance for right heart catheterization (RHC)-confirmed PH in patients with ILD.  Study Code: GMS-PH-001 Protocol Edition: Amendment 1 dated 16 Feb 2022 Sponsor Name: [employer] Primary Investigator: Dr. Susy Frizzle Hunsucker  Protocol Version for 03/08/2023 date of 16 Feb 2022  Consent Version for 03/08/2023 date of 06 Jul 2022   Lab Manual: v1 dated 30 Jan 2022   Key Inclusion Criteria:  Patients are eligible if they have a diagnosis of ILD based on computed tomography imaging, including:  Idiopathic interstitial pneumonia, including idiopathic pulmonary fibrosis  Connective tissue disease-associated ILD with forced vital capacity (FVC) <70%  Hypersensitivity pneumonitis  Scleroderma-related ILD  Autoimmune ILD  Nonspecific interstitial pneumonia  Occupational lung disease  Combined pulmonary fibrosis and emphysema  Additionally, patients must meet a total of 2 or more criteria from at least 2 distinct categories below (ie, Category 1, Category 2, Category 3) based on the Investigator's clinical judgment, within 180 days of Screening.  Category 1: Clinical tests: a) Pulmonary function tests (PFTs)  meeting specified study guidelines for DLCO. b) Historical HRCT with specified findings consistent with increased pulmonary arterial pressure.  Category 2: Symptoms, oxygenation, exercise capacity, and brain natriuretic peptide/N-terminal pro-brain natriuretic peptide indicative of worsening status. Category 3:  Echocardiography Specified findings of increased RV systolic pressure or RV enlargement.  If a patient does not have a historical PFT, HRCT, or echocardiogram needed to assess eligibility for the study, these procedures will be performed as part of study screening.  Key Exclusion Criteria:  Patients are excluded if any of the following criteria apply:  Prior RHC with mean pulmonary artery pressure >20 mmHg  Currently on an FDA-approved pulmonary arterial hypertension medication  Diagnosed with chronic obstructive pulmonary disease  Uncontrolled or untreated sleep apnea  Pulmonary embolism within the past 3 months  History of ischemic heart disease or left-sided myocardial dysfunction within 12 months of Screening, defined as left ventricular ejection fraction <40% or pulmonary capillary wedge pressure >15 mmHg  Safety Data: Not Applicable.    [facility] [place] Clinical Research Coordinator note:   This visit for Subject Rhonda [name] with DOB: 12/04/1950 on 03/08/2023 for the above protocol is Visit 1  and is for purpose of research.   The consent for this encounter is under Protocol Version amendment 1,  Consent Version Revised 06 Jul 2022 and  is currently IRB approved.   Subject expressed continued interest and consent in continuing as a study subject. Subject confirmed that there was    no change in contact information (e.g. address, telephone, email). Subject thanked for participation in research and contribution to science. In this visit 03/08/2023 the subject will be evaluated by Principal Investigator named Dr. Lorayne Marek. This research coordinator has verified that the above investigator is  up to date with his/her training logs.   The Subject was informed that the PI  continues to have oversight of the subject's visits and course through relevant discussions, reviews, and also specifically of this visit by routing of this note to the PI.     1. This visit is a key  visit of study.  The PI is available for this visit.  Please see subject binder for further information.  Signed by  Rhonda Constant MD  Clinical Research Coordinator [facility]  [place], [place] 3:22 PM 03/09/2023

## 2023-03-09 NOTE — Telephone Encounter (Signed)
Pt. Was called to get off your May 9th day but I canceled her pft B&A apt. Due to she just had a pft and wanted to make sure you were ok with the PFT she just had so didn't have to do again

## 2023-03-10 ENCOUNTER — Ambulatory Visit (HOSPITAL_COMMUNITY)
Admission: RE | Admit: 2023-03-10 | Discharge: 2023-03-10 | Disposition: A | Discharge status: Home / Self Care | Payer: Medicare PPO | Source: Ambulatory Visit | Attending: Family Medicine | Admitting: Family Medicine

## 2023-03-10 ENCOUNTER — Other Ambulatory Visit: Payer: Self-pay | Admitting: Family Medicine

## 2023-03-10 ENCOUNTER — Encounter: Payer: Self-pay | Admitting: Internal Medicine

## 2023-03-10 DIAGNOSIS — I272 Pulmonary hypertension, unspecified: Secondary | ICD-10-CM

## 2023-03-10 DIAGNOSIS — I080 Rheumatic disorders of both mitral and aortic valves: Secondary | ICD-10-CM | POA: Insufficient documentation

## 2023-03-10 DIAGNOSIS — Z006 Encounter for examination for normal comparison and control in clinical research program: Secondary | ICD-10-CM | POA: Insufficient documentation

## 2023-03-10 DIAGNOSIS — R0600 Dyspnea, unspecified: Secondary | ICD-10-CM | POA: Insufficient documentation

## 2023-03-10 DIAGNOSIS — Z1231 Encounter for screening mammogram for malignant neoplasm of breast: Secondary | ICD-10-CM

## 2023-03-10 DIAGNOSIS — J849 Interstitial pulmonary disease, unspecified: Secondary | ICD-10-CM

## 2023-03-10 DIAGNOSIS — I493 Ventricular premature depolarization: Secondary | ICD-10-CM | POA: Insufficient documentation

## 2023-03-10 NOTE — Progress Notes (Signed)
Echocardiogram 2D Echocardiogram has been performed.  Rhonda [name] 03/10/2023, 3:00 PM

## 2023-03-11 ENCOUNTER — Encounter: Payer: Self-pay | Admitting: Internal Medicine

## 2023-03-11 NOTE — Telephone Encounter (Signed)
I have received the following message     "Got  a text  from [facility]  estimating my cost for heart cath is $250. This must be an error. I assume the study pays for it. Can you have Dr Jordan Likes  look into this. ?  I responded back to [facility] with a similar message. There may have charged me copay for echo too.  Thanks for your help. "  Dr. Marchelle Gearing please advise     "

## 2023-03-11 NOTE — Telephone Encounter (Signed)
Issue talking about the study echo on 03/10/2023? LMK

## 2023-03-12 LAB — ECHOCARDIOGRAM COMPLETE: P 1/2 time: 318 msec

## 2023-03-12 NOTE — Telephone Encounter (Signed)
Per patient, she is referring to the right heart cath on 4/26.

## 2023-03-15 ENCOUNTER — Telehealth: Payer: Self-pay | Admitting: Internal Medicine

## 2023-03-15 NOTE — Telephone Encounter (Signed)
[place]/Emer  [name] is quite concerned that she got charged for the wrong echo. She had 2 echos. 1 research and prior to that [place]andard of care. Also concerned she will be charged for RHC. I am told Emer that you have reassured our coordinator Soledad Gerlach that the procedures are under Phinder . However, I am writing to you just to be  extra cautious given her 2 calls to our pulm office  Thanks    SIGNATURE    Dr. Kalman Shan, M.D., F.C.C.P,  Pulmonary and Critical Care Medicine Staff Physician, [facility] Director - Interstitial Lung Disease  Program  Medical Director - [facility] at [facility] [place], [place], [address]   Pager: [phone], If no answer  -> Check [facility] or Try [phone] Telephone (clinical office): [phone] Telephone (research): [phone]  2:17 PM 03/15/2023

## 2023-03-15 NOTE — Telephone Encounter (Signed)
Pls tell her I have emailed her and I have made [facility] aware. Please tell her to call me tonight aftrer 8pm

## 2023-03-16 ENCOUNTER — Encounter: Payer: Self-pay | Admitting: Internal Medicine

## 2023-03-16 ENCOUNTER — Telehealth: Payer: Self-pay | Admitting: Internal Medicine

## 2023-03-16 NOTE — Telephone Encounter (Signed)
Please advise. Thanks.  

## 2023-03-17 ENCOUNTER — Telehealth (HOSPITAL_COMMUNITY): Payer: Self-pay

## 2023-03-17 ENCOUNTER — Encounter: Payer: Self-pay | Admitting: Internal Medicine

## 2023-03-17 NOTE — Telephone Encounter (Signed)
Spoke with the patient and answered her questions regarding her RHC on Friday

## 2023-03-17 NOTE — Telephone Encounter (Signed)
Patient called and asked for her heart cath instructions for Friday. She left a message and stated she had some questions and needed to go over them again.

## 2023-03-17 NOTE — Telephone Encounter (Signed)
Dr. Marchelle Gearing, Please see patient message regarding Zpack she states she was told would be sent in.  I do not see any mention in a recent note.  Please clarify and advise.  Thank you.

## 2023-03-18 ENCOUNTER — Ambulatory Visit (INDEPENDENT_AMBULATORY_CARE_PROVIDER_SITE_OTHER): Payer: Medicare PPO

## 2023-03-18 ENCOUNTER — Ambulatory Visit: Payer: Medicare PPO | Admitting: Student

## 2023-03-18 ENCOUNTER — Encounter: Payer: Self-pay | Admitting: Student

## 2023-03-18 ENCOUNTER — Other Ambulatory Visit: Payer: Self-pay | Admitting: Student

## 2023-03-18 ENCOUNTER — Telehealth: Payer: Self-pay | Admitting: Internal Medicine

## 2023-03-18 ENCOUNTER — Telehealth: Payer: Self-pay | Admitting: Student

## 2023-03-18 DIAGNOSIS — R051 Acute cough: Secondary | ICD-10-CM

## 2023-03-18 DIAGNOSIS — J849 Interstitial pulmonary disease, unspecified: Secondary | ICD-10-CM | POA: Diagnosis not present

## 2023-03-18 LAB — PHOSPHORUS: Phosphorus: 3.4 mg/dL (ref 2.3–4.6)

## 2023-03-18 LAB — BASIC METABOLIC PANEL
BUN: 22 mg/dL (ref 6–23)
CO2: 27 mEq/L (ref 19–32)
Calcium: 9.3 mg/dL (ref 8.4–10.5)
Chloride: 101 mEq/L (ref 96–112)
Creatinine, Ser: 1.13 mg/dL (ref 0.40–1.20)
GFR: 48.09 mL/min — ABNORMAL LOW (ref >60.00)
Glucose, Bld: 247 mg/dL — ABNORMAL HIGH (ref 70–99)
Potassium: 4.8 mEq/L (ref 3.5–5.1)
Sodium: 136 mEq/L (ref 135–145)

## 2023-03-18 LAB — HEPATIC FUNCTION PANEL
ALT: 22 U/L (ref 0–35)
AST: 19 U/L (ref 0–37)
Albumin: 3.7 g/dL (ref 3.5–5.2)
Alkaline Phosphatase: 57 U/L (ref 39–117)
Bilirubin, Direct: 0.1 mg/dL (ref 0.0–0.3)
Total Bilirubin: 0.3 mg/dL (ref 0.2–1.2)
Total Protein: 7 g/dL (ref 6.0–8.3)

## 2023-03-18 LAB — MAGNESIUM: Magnesium: 1.9 mg/dL (ref 1.5–2.5)

## 2023-03-18 LAB — CBC WITH DIFFERENTIAL/PLATELET
Basophils Absolute: 0 10*3/uL (ref 0.0–0.1)
Basophils Relative: 0.1 % (ref 0.0–3.0)
Eosinophils Absolute: 0 10*3/uL (ref 0.0–0.7)
Eosinophils Relative: 0.1 % (ref 0.0–5.0)
HCT: 45.8 % (ref 36.0–46.0)
Hemoglobin: 14.8 g/dL (ref 12.0–15.0)
Lymphocytes Relative: 8.2 % — ABNORMAL LOW (ref 12.0–46.0)
Lymphs Abs: 1.2 10*3/uL (ref 0.7–4.0)
MCHC: 32.4 g/dL (ref 30.0–36.0)
MCV: 86.8 fl (ref 78.0–100.0)
Monocytes Absolute: 0.6 10*3/uL (ref 0.1–1.0)
Monocytes Relative: 3.7 % (ref 3.0–12.0)
Neutro Abs: 13.2 10*3/uL — ABNORMAL HIGH (ref 1.4–7.7)
Neutrophils Relative %: 87.9 % — ABNORMAL HIGH (ref 43.0–77.0)
Platelets: 228 10*3/uL (ref 150.0–400.0)
RBC: 5.28 Mil/uL — ABNORMAL HIGH (ref 3.87–5.11)
RDW: 15.9 % — ABNORMAL HIGH (ref 11.5–15.5)
WBC: 15.1 10*3/uL — ABNORMAL HIGH (ref 4.0–10.5)

## 2023-03-18 LAB — BRAIN NATRIURETIC PEPTIDE: Pro B Natriuretic peptide (BNP): 71 pg/mL (ref 0.0–100.0)

## 2023-03-18 LAB — SEDIMENTATION RATE: Sed Rate: 28 mm/hr (ref 0–30)

## 2023-03-18 MED ORDER — FLUTICASONE FUROATE-VILANTEROL 100-25 MCG/ACT IN AEPB: 1.0000 | INHALATION_SPRAY | Freq: Every day | RESPIRATORY_TRACT | 3 refills | Status: DC

## 2023-03-18 MED ORDER — METHYLPREDNISOLONE ACETATE 40 MG/ML INJ SUSP (RADIOLOG
120.0000 mg | Freq: Once | INTRAMUSCULAR | Status: DC
Start: 2023-03-18 — End: 2024-03-17

## 2023-03-18 MED ORDER — AZITHROMYCIN 250 MG PO TABS: ORAL_TABLET | ORAL | 0 refills | Status: DC

## 2023-03-18 MED ORDER — METHYLPREDNISOLONE ACETATE 80 MG/ML IJ SUSP
120.0000 mg | Freq: Once | INTRAMUSCULAR | Status: AC
Start: 2023-03-18 — End: 2023-03-18
  Administered 2023-03-18: 120 mg via INTRAMUSCULAR

## 2023-03-18 MED ORDER — AMOXICILLIN-POT CLAVULANATE 875-125 MG PO TABS: 1.0000 | ORAL_TABLET | Freq: Two times a day (BID) | ORAL | 0 refills | Status: AC

## 2023-03-18 NOTE — Telephone Encounter (Signed)
Patient called this morning saying she is experiencing throat pain, chest pain, upper respiratory congestion with coughing up green phlegm and chills. Does not have a temperature and tested negative for Covid at home. Chest pain did not feel sharp. She said she is desaturating to 84% with minimal activity and increased her oxygen to 5L to try bring it up. Has been feeling ill since Monday 22Apr2024 but symptoms got much worse this morning 25Apr2025.  Spoke with Dr. Marchelle Gearing. He is going to call patient with instructions.

## 2023-03-18 NOTE — Patient Instructions (Addendum)
-   sputum cultures, viral swab - augmentin 7 day course, z pack - depo medrol injection today, resume prednisone 20 mg daily till you hear otherwise from [place], follow up appointment 5/21 - call if no better in a few days

## 2023-03-18 NOTE — Telephone Encounter (Signed)
What is least expensive LABA/ICS option for her?  THanks!

## 2023-03-18 NOTE — Telephone Encounter (Signed)
Got it  I will work with staff at our end to ensure that linkage happens and how to make that happen. Also, she is sick with sinusitis 03/18/2023 . So we want to cancel RHC. She feels she cannot do it  Thanks

## 2023-03-18 NOTE — Progress Notes (Signed)
Synopsis: Referred for acute on chronic hypoxic respiratory failure by Dois Davenport, MD  Subjective:   PATIENT ID: Rhonda [name] GENDER: female DOB: 11-Apr-1951, MRN: [id number]  Chief Complaint  Patient presents with   Acute Visit    Head congestion, "feeling foggy", increased SOB and cough with green sputum over the past 5 days.    72yF with history of chronic HP (has had VATS bx 2013 supportive of dx) f/b MR and seen by [facility] transplant program.   Last seen in clinic 02/11/23. At that point on prednisone 5 mg daily, pirfenidone. Had planned to start cellcept at that visit. Ended up shifting gears and then had been planned for RHC for evaluation for candidacy for tyvaso today. 3 weeks ago started a prednisone taper from 40 mg daily x1 week, currently on 20 mg daily for the rest of this week.   Never did start z pack. She actually kinda wonders if she has sinus infection.   She does not take bactrim due to history of headaches with sulfa abx.   Weight is stable relative to prior visits   Otherwise pertinent review of systems is negative.  Past Medical History:  Diagnosis Date   Allergy    Arthritis    history spinal stenosis. osteoarthritis right hip   Asthma    Cataract    Coronary artery calcification seen on CAT scan 08/19/2017   >300 on CT scan 08/2017   DDD (degenerative disc disease), thoracic    Depression    DOE (dyspnea on exertion)    a. 04/2010 Lexi MV EF 71%, no ischemia/infarct;     Fatty liver    GERD (gastroesophageal reflux disease)    H/O steroid therapy    Steroid use orally over 4 yrs- for Lung Fibrosis   Heart palpitations 02/28/2015   Helicobacter pylori ab+    Hemorrhoids    Hiatal hernia    High cholesterol    History of chronic bronchitis    as child   History of migraine    History of MRSA infection    Hyperlipidemia, mixed 08/19/2017   Hyperplastic colon polyp 2007   IBS (irritable bowel syndrome)    Inguinal hernia    right    Insulin resistance    past   Interstitial lung disease    MVP (mitral valve prolapse)    Posterior mitral valve leaflet with mild MR   Pneumonia    Pneumonitis, hypersensitivity    a. 09/2012 s/p Bx - ? 2/2 bird, mold, oil paint exposure ->on steroids, followed by pulm.   PONV (postoperative nausea and vomiting)    Pre-diabetes    takes Metformin   Pulmonary fibrosis    Dr. Marchelle Gearing follows- stable at present   PVC (premature ventricular contraction) 08/19/2017   Rapid heart rate    Dr. Mayford Knife follows- last visit Epic note 9'16   Squamous cell carcinoma of skin    Tinnitus, right ear    Vocal cord ulcer      Family History  Problem Relation Age of Onset   Asthma Mother    Osteoarthritis Mother    Dementia Mother 83   Lymphoma Father    Diabetes Father    Hypertension Sister    Heart disease Sister    Kidney disease Sister    Allergic rhinitis Daughter    Ovarian cancer Maternal Aunt    Breast cancer Paternal Aunt    Breast cancer Paternal Aunt    Emphysema Maternal  Grandmother    Asthma Maternal Grandmother    Lung disease Maternal Grandfather    Bone cancer Paternal Grandfather    Breast cancer Cousin    Breast cancer Cousin    Breast cancer Cousin    Colon cancer Neg Hx    Angioedema Neg Hx    Eczema Neg Hx    Immunodeficiency Neg Hx    Urticaria Neg Hx      Past Surgical History:  Procedure Laterality Date   80 HOUR PH STUDY N/A 02/21/2018   Procedure: 24 HOUR PH STUDY;  Surgeon: Napoleon Form, MD;  Location: [facility];  Service: Endoscopy;  Laterality: N/A;   BREAST BIOPSY Right 2009   BIOPSY, pt denies   BREAST BIOPSY Left 2003   Benign    CATARACT EXTRACTION Left    CESAREAN SECTION     COLONOSCOPY     ESOPHAGEAL MANOMETRY N/A 02/21/2018   Procedure: ESOPHAGEAL MANOMETRY (EM);  Surgeon: Napoleon Form, MD;  Location: [facility];  Service: Endoscopy;  Laterality: N/A;   FOOT FRACTURE SURGERY  2006 or 2007   right   HYMENECTOMY      LUNG BIOPSY  09/28/2012   Procedure: LUNG BIOPSY;  Surgeon: Loreli Slot, MD;  Location: [facility] OR;  Service: Thoracic;  Laterality: N/A;  lung biopsies tims three   SQUAMOUS CELL CARCINOMA EXCISION Left    left arm   TOTAL HIP ARTHROPLASTY Right 09/10/2015   Procedure: RIGHT TOTAL HIP ARTHROPLASTY ANTERIOR APPROACH;  Surgeon: Durene Romans, MD;  Location: [facility];  Service: Orthopedics;  Laterality: Right;   TOTAL HIP ARTHROPLASTY Left 10/03/2019   Procedure: TOTAL HIP ARTHROPLASTY ANTERIOR APPROACH;  Surgeon: Durene Romans, MD;  Location: [facility];  Service: Orthopedics;  Laterality: Left;  70 mins   TUBAL LIGATION     UPPER GI ENDOSCOPY     VIDEO ASSISTED THORACOSCOPY  09/28/2012   Procedure: VIDEO ASSISTED THORACOSCOPY;  Surgeon: Loreli Slot, MD;  Location: [facility] OR;  Service: Thoracic;  Laterality: Right;   VIDEO BRONCHOSCOPY  11/19/2011   Procedure: VIDEO BRONCHOSCOPY WITH FLUORO;  Surgeon: Kalman Shan, MD;  Location: [facility];  Service: Endoscopy;;    Social History   Socioeconomic History   Marital status: Married    Spouse name: [name]   Number of children: 1   Years of education: Not on file   Highest education level: Not on file  Occupational History   Occupation: SELF EMPLOYED    Comment: Careers information officer, retired    Associate Professor: [employer]  Tobacco Use   Smoking status: Never   Smokeless tobacco: Never   Tobacco comments:    pt states she experimented in college  Vaping Use   Vaping Use: Never used  Substance and Sexual Activity   Alcohol use: Yes    Alcohol/week: 2.0 standard drinks of alcohol    Types: 1 Glasses of wine, 1 Standard drinks or equivalent per week    Comment: once a wk. 1-2 glasses   Drug use: No   Sexual activity: Never    Birth control/protection: None    Comment: sex partners in the last 12 months 0  Other Topics Concern   Not on file  Social History Narrative   Married; Doctor, general practice      Exercise -  swimming daily for 20-30 minutes   Patient was started her periods at 72 year old (regular periods), and painful periods      Social Determinants of Health  Financial Resource Strain: Not on file  Food Insecurity: Not on file  Transportation Needs: Not on file  Physical Activity: Not on file  Stress: Not on file  Social Connections: Not on file  Intimate Partner Violence: Not on file     Allergies  Allergen Reactions   Atorvastatin Other (See Comments)    Leg pain   Betadine [Povidone Iodine] Other (See Comments)    blisters   Codeine Nausea And Vomiting   Garlic Diarrhea   Hydrocodone Nausea And Vomiting   Macrobid [Nitrofurantoin] Other (See Comments)   Ofev [Nintedanib] Other (See Comments)    Abdominal pain   Onion Diarrhea   Other Other (See Comments)   Rosuvastatin Other (See Comments)    Leg pain   Shellfish Allergy Nausea And Vomiting   Sulfa Antibiotics    Sulfonamide Derivatives Other (See Comments)    headaches     Outpatient Medications Prior to Visit  Medication Sig Dispense Refill   acetaminophen (TYLENOL) 500 MG tablet Tylenol Extra Strength     albuterol (PROAIR HFA) 108 (90 Base) MCG/ACT inhaler Inhale 2 puffs into the lungs every 4 (four) hours as needed for wheezing. Or coughing spells.  You may use 2 Puffs 5-10 minutes before exercise. 1 Inhaler 3   Alpha-D-Galactosidase (BEANO PO) Take 1 tablet by mouth as needed (if eating onion or garlic).      Ascorbic Acid (VITAMIN C) 1000 MG tablet Take 1,000 mg by mouth daily.     ASHWAGANDHA PO Take 2 capsules by mouth daily.     azelastine (ASTELIN) 0.1 % nasal spray Place 1 spray into both nostrils at bedtime.     Bacillus Coagulans-Inulin (ALIGN PREBIOTIC-PROBIOTIC PO) Take 2 tablets by mouth daily at 6 (six) AM. chewable     Calcium Carb-Cholecalciferol (CALCIUM 500 + D PO) Take 2 tablets by mouth daily at 6 (six) AM.     chlorpheniramine (CHLOR-TRIMETON) 4 MG tablet Take 2 mg by mouth 2 (two) times  daily. Takes half tablet twice daily     cholecalciferol (VITAMIN D3) 25 MCG (1000 UNIT) tablet Take 1,000 Units by mouth every other day.     dicyclomine (BENTYL) 10 MG capsule Take 1 capsule by mouth daily as needed.     diltiazem (CARDIZEM CD) 120 MG 24 hr capsule Take 1 capsule (120 mg total) by mouth daily. 90 capsule 3   diphenhydrAMINE-PE-APAP (DELSYM COUGH/COLD NIGHT TIME) 12.5-5-325 MG/10ML LIQD Take by mouth as needed.     EPINEPHrine 0.3 mg/0.3 mL IJ SOAJ injection Inject into the muscle.     ezetimibe (ZETIA) 10 MG tablet Take 1 tablet (10 mg total) by mouth daily. Please keep upcoming appt for future refills. Thank you. 90 tablet 3   fluticasone (FLONASE) 50 MCG/ACT nasal spray Spray 2 sprays into each nostril every day. 48 g 1   fluticasone furoate-vilanterol (BREO ELLIPTA) 100-25 MCG/ACT AEPB Inhale 1 puff into the lungs daily. 180 each 3   lactase (LACTAID) 3000 units tablet Take by mouth.     levocetirizine (XYZAL) 5 MG tablet Take 5 mg by mouth at bedtime.     metoprolol succinate (TOPROL XL) 25 MG 24 hr tablet Take 1 tablet (25 mg total) by mouth daily. (Patient taking differently: Take 12.5 mg by mouth daily.) 90 tablet 1   montelukast (SINGULAIR) 10 MG tablet TAKE 1 TABLET BY MOUTH EVERYDAY AT BEDTIME 90 tablet 2   Multiple Vitamin (MULTIVITAMIN) tablet Take 1 tablet by mouth daily.  nystatin-triamcinolone (MYCOLOG II) cream nystatin-triamcinolone 100,000 unit/g-0.1 % topical cream     omeprazole (PRILOSEC OTC) 20 MG tablet Take 20 mg by mouth daily.     OXYGEN Inhale into the lungs. Up to 6 liters as needed during the day and nite time use on/off     OZEMPIC, 0.25 OR 0.5 MG/DOSE, 2 MG/1.5ML SOPN Inject into the skin.     pravastatin (PRAVACHOL) 40 MG tablet Take 1 tablet (40 mg total) by mouth every evening. 90 tablet 1   predniSONE (DELTASONE) 20 MG tablet Take 1 tablet (20 mg total) by mouth daily with breakfast. 30 tablet 5   Sodium Chloride, Inhalant, 7 % NEBU Use per  nebulizer once a day to help clear sputum 120 mL 3   predniSONE (DELTASONE) 5 MG tablet Take 1 tablet (5 mg total) by mouth daily with breakfast. 90 tablet 3   nitroGLYCERIN (NITROSTAT) 0.4 MG SL tablet Place 1 tablet (0.4 mg total) under the tongue every 5 (five) minutes as needed for chest pain. 25 tablet 12   amoxicillin-clavulanate (AUGMENTIN) 875-125 MG tablet Take 1 tablet by mouth 2 (two) times daily. 14 tablet 0   predniSONE (DELTASONE) 10 MG tablet Take 4 tablets (40 mg total) by mouth daily with breakfast for 14 days, THEN 3 tablets (30 mg total) daily with breakfast for 14 days, THEN 2 tablets (20 mg total) daily with breakfast for 14 days. 126 tablet 0   No facility-administered medications prior to visit.       Objective:   Physical Exam:  General appearance: 72 y.o., female, NAD, conversant  Eyes: anicteric sclerae; PERRL, tracking appropriately HENT: NCAT; MMM Neck: Trachea midline; no lymphadenopathy, no JVD Lungs: +crackles, with normal respiratory effort CV: RRR, no murmur  Abdomen: Soft, non-tender; non-distended, BS present  Extremities: No peripheral edema, warm Skin: Normal turgor and texture; no rash Psych: Appropriate affect Neuro: Alert and oriented to person and place, no focal deficit     Vitals:   03/18/23 1421  BP: 122/66  Pulse: 86  Temp: 98 F (36.7 C)  TempSrc: Oral  SpO2: 94%  Weight: 137 lb 3.2 oz (62.2 kg)  Height: 5' (1.524 m)   94% on RA BMI Readings from Last 3 Encounters:  03/18/23 26.80 kg/m  02/11/23 27.03 kg/m  01/20/23 27.07 kg/m   Wt Readings from Last 3 Encounters:  03/18/23 137 lb 3.2 oz (62.2 kg)  02/11/23 138 lb 6.4 oz (62.8 kg)  01/20/23 138 lb 9.6 oz (62.9 kg)     CBC    Component Value Date/Time   WBC 15.8 (H) 02/12/2023 1158   RBC 5.23 (H) 02/12/2023 1158   HGB 14.7 02/12/2023 1158   HCT 45.2 02/12/2023 1158   PLT 244.0 02/12/2023 1158   MCV 86.3 02/12/2023 1158   MCH 29.5 10/04/2019 0216   MCHC  32.6 02/12/2023 1158   RDW 15.2 02/12/2023 1158   LYMPHSABS 1.3 02/12/2023 1158   MONOABS 0.6 02/12/2023 1158   EOSABS 0.2 02/12/2023 1158   BASOSABS 0.1 02/12/2023 1158     Chest Imaging: CXR similar to prior   Pulmonary Functions Testing Results:    Latest Ref Rng & Units 03/08/2023   10:49 AM 12/09/2022   11:48 AM 07/21/2022    9:50 AM 01/06/2022    9:51 AM 10/03/2021    8:59 AM 01/14/2021    1:51 PM 11/06/2020   11:03 AM  PFT Results  FVC-Pre L 1.45  1.53  1.54  1.58  1.48  1.49  1.41   FVC-Predicted Pre % 64  66  67  67  63  62  59   Pre FEV1/FVC % % 93  87  91  90  92  92  93   FEV1-Pre L 1.35  1.32  1.40  1.43  1.37  1.37  1.31   FEV1-Predicted Pre % 80  77  81  81  78  77  73   DLCO uncorrected ml/min/mmHg 8.11  9.64  8.16  8.62  8.92  11.26  9.70   DLCO UNC% % 50  59  50  53  55  69  59   DLCO corrected ml/min/mmHg 7.81  9.41  7.86  8.62  8.92  11.26  10.53   DLCO COR %Predicted % 48  58  48  53  55  69  64   DLVA Predicted % 65  81  64  75  78  92  89   TLC L 3.02         TLC % Predicted % 70         RV % Predicted % 70          Lung function 03/08/23 overall stable vs marginal decline in DLCO (though same as measured 07/21/22)  Echocardiogram:    1. Left ventricular ejection fraction, by estimation, is 65 to 70%. The  left ventricle has normal function. The left ventricle has no regional  wall motion abnormalities. Left ventricular diastolic parameters are  indeterminate.   2. Right ventricular systolic function is normal. The right ventricular  size is mildly enlarged. There is mildly elevated pulmonary artery  systolic pressure. The estimated right ventricular systolic pressure is  36.6 mmHg.   3. Left atrial size was mild to moderately dilated.   4. Right atrial size was mildly dilated.   5. The mitral valve is normal in structure. Trivial mitral valve  regurgitation. No evidence of mitral stenosis.   6. The aortic valve is tricuspid. There is mild  calcification of the  aortic valve. Aortic valve regurgitation is mild. No aortic stenosis is  present.   7. The inferior vena cava is normal in size with greater than 50%  respiratory variability, suggesting right atrial pressure of 3 mmHg.       Assessment & Plan:   # Dyspnea # Acute cough Possibilities of minor flare of chronic HP as steroid dose lowered. Severity of symptoms, lack authentic increase in O2 requirement or significant radiographic change argue against full-blown AE-ILD. Though she may simply have bacterial sinusitis, hard to say for sure she doesn't have pneumonia with her background changes.  Plan: - sputum cultures, PJP DFA and PCR sputum, swab - CAP coverage ABX - medrol 120 injection today - resume prednisone 20 mg daily till you hear otherwise from [place], follow up appointment 5/21 - continue breo and we'll price out alternative LABA/ICS options - call if no better in a few days     Omar Person, MD [facility] 03/18/2023 2:38 PM

## 2023-03-18 NOTE — Telephone Encounter (Signed)
She is a lot worse. See other phone note from 03/18/2023 - NEEDS TO COME IN for LABS and OV

## 2023-03-18 NOTE — Telephone Encounter (Signed)
I checked the apps schedule, they do not have any openings today or tomorrow.   Called and spoke with patient. She is aware to come by the office for labs and CXR. Provided her with the lab and CXR hours. Sputum cup placed at the front desk. Orders have been placed.   I was able to get her scheduled with Dr. Thora Lance this afternoon at 230pm. She is aware she can have her labs and CXR done before her visit if she comes in early.   Nothing further needed at time of call.

## 2023-03-18 NOTE — Telephone Encounter (Addendum)
    Spoke to patient 03/18/2023 AM [name]   Having green sputum Exercise hypoxemia + - needing 5LNC Runny nose Increased cough Symp asscoaed with spring pollen + reducing prednisone Not sick enough to go to ER but sick enough - wants to be seen 03/18/2023 and not have RHC 03/19/23  Plan  - cxr 2 view 03/18/2023 - cbc with diff, bmet, mag, phos, lactate, lft, bnp and ESR 03/18/2023 - sputum gram stain and culture 03/18/2023  - SEE APP 03/18/2023   - she lives 2 min from our office and can make 1 trrop for labs/cxr and another trip for office vist but NEEDS TO BE SEEN 03/18/2023 - if no app schedule LMK   - I hjave informed [facility] staff to cancel RHC

## 2023-03-18 NOTE — Addendum Note (Signed)
Addended by: Christen Butter on: 03/18/2023 04:24 PM   Modules accepted: Orders

## 2023-03-19 ENCOUNTER — Other Ambulatory Visit: Payer: Medicare PPO

## 2023-03-19 ENCOUNTER — Telehealth (HOSPITAL_COMMUNITY): Payer: Self-pay | Admitting: Cardiology

## 2023-03-19 ENCOUNTER — Ambulatory Visit (HOSPITAL_COMMUNITY): Admission: RE | Admit: 2023-03-19 | Payer: Medicare PPO | Source: Home / Self Care | Admitting: Cardiology

## 2023-03-19 ENCOUNTER — Encounter (HOSPITAL_COMMUNITY): Admission: RE | Payer: Self-pay | Source: Home / Self Care

## 2023-03-19 ENCOUNTER — Other Ambulatory Visit (HOSPITAL_COMMUNITY): Payer: Self-pay

## 2023-03-19 ENCOUNTER — Telehealth: Payer: Self-pay | Admitting: Internal Medicine

## 2023-03-19 DIAGNOSIS — R051 Acute cough: Secondary | ICD-10-CM

## 2023-03-19 LAB — LACTIC ACID, PLASMA: LACTIC ACID: 2.1 mmol/L — ABNORMAL HIGH (ref 0.4–1.8)

## 2023-03-19 SURGERY — RIGHT HEART CATH
Anesthesia: LOCAL

## 2023-03-19 NOTE — Telephone Encounter (Signed)
   Spoke to patinet   Results given  Currenty on pred 20mg  only  Plan  - pred 40mg  x daily 1 weeks -> then to 30mg  per day and stay there -(she has home prescripton) - if getting worse call me - cancel RHC through research due to illness -> might have to rescreen    LABS    PULMONARY No results for input(s): "PHART", "PCO2ART", "PO2ART", "HCO3", "TCO2", "O2SAT" in the last 168 hours.  Invalid input(s): "PCO2", "PO2"  CBC Recent Labs  Lab 03/18/23 1402  HGB 14.8  HCT 45.8  WBC 15.1*  PLT 228.0    COAGULATION No results for input(s): "INR" in the last 168 hours.  CARDIAC  No results for input(s): "TROPONINI" in the last 168 hours. Recent Labs  Lab 03/18/23 1402  PROBNP 71.0     CHEMISTRY Recent Labs  Lab 03/18/23 1402  NA 136  K 4.8  CL 101  CO2 27  GLUCOSE 247*  BUN 22  CREATININE 1.13  CALCIUM 9.3  MG 1.9  PHOS 3.4   Estimated Creatinine Clearance: 36 mL/min (by C-G formula based on SCr of 1.13 mg/dL).   LIVER Recent Labs  Lab 03/18/23 1402  AST 19  ALT 22  ALKPHOS 57  BILITOT 0.3  PROT 7.0  ALBUMIN 3.7     INFECTIOUS No results for input(s): "LATICACIDVEN", "PROCALCITON" in the last 168 hours.   ENDOCRINE CBG (last 3)  No results for input(s): "GLUCAP" in the last 72 hours.       IMAGING x48h  - image(s) personally visualized  -   highlighted in bold No results found.

## 2023-03-19 NOTE — Progress Notes (Signed)
Results given.

## 2023-03-19 NOTE — Telephone Encounter (Signed)
Pt called to r/s  RHC  Will verify with research team for date window to get procedure repeated

## 2023-03-19 NOTE — Telephone Encounter (Signed)
Virgel Bouquet is the only option showing as being covered, however too soon to refill due to last fill on 03/18/2023

## 2023-03-20 LAB — COVID-19, FLU A+B AND RSV
Influenza A, NAA: NOT DETECTED
Influenza B, NAA: NOT DETECTED
RSV, NAA: NOT DETECTED
SARS-CoV-2, NAA: NOT DETECTED

## 2023-03-22 ENCOUNTER — Other Ambulatory Visit: Payer: Medicare PPO

## 2023-03-22 DIAGNOSIS — R051 Acute cough: Secondary | ICD-10-CM

## 2023-03-23 LAB — RESPIRATORY CULTURE OR RESPIRATORY AND SPUTUM CULTURE: MICRO NUMBER:: 14886320

## 2023-03-24 ENCOUNTER — Encounter: Payer: Self-pay | Admitting: Internal Medicine

## 2023-03-24 ENCOUNTER — Other Ambulatory Visit: Payer: Self-pay | Admitting: Internal Medicine

## 2023-03-25 NOTE — Telephone Encounter (Signed)
Has to get re evaluated by the research team before we can reschedule RHC

## 2023-03-25 NOTE — Telephone Encounter (Signed)
I have received the following message   "I will need a refill of 10 mg and 20 mg yo stay on 30. I I will be running out soon. [facility]"  Dr. Marchelle Gearing is it okay to refill prednsione

## 2023-03-25 NOTE — Telephone Encounter (Signed)
Yes plese

## 2023-04-01 ENCOUNTER — Ambulatory Visit: Payer: Medicare PPO | Admitting: Internal Medicine

## 2023-04-08 ENCOUNTER — Encounter: Payer: Self-pay | Admitting: Internal Medicine

## 2023-04-09 NOTE — Telephone Encounter (Signed)
Any further issues with billing?

## 2023-04-12 NOTE — Patient Instructions (Signed)
ICD-10-CM   1. Hypersensitivity pneumonitis (HCC)  J67.9     2. ILD (interstitial lung disease) (HCC)  J84.9     3. Hiatal hernia  K44.9     4. Current chronic use of systemic steroids  Z79.52      Significant inflammatory component spurring progression though now better controlled with higher dose prednisone   Plan  - continue prednisone 20mg  per day; monitor closely for flareups. -You might need Bactrim prophylaxis even at this dose of prednisone but we can decide this later -We discussed CellCept again but took a shared decision making to hold off at this point in time -We decided to pursue right heart catheterization through the [facility] study  - be in touch with [facility] staff -continue to  control Acid reflux well - use o2 to keep pusle ox > 88% (night and exercise)  Followup - spirometry and dlco in 6 weeks  - 30 min visit in 6 weeks with DR Marchelle Gearing

## 2023-04-12 NOTE — Progress Notes (Unsigned)
#GE reflux with small hiatal hernia  - on ppi   #History of rapid heart rate not otherwise specified  - Start her on Lopressor  2008/2009 by primary care physician. History appears to correlate with onset of respiratory issues  - refuses to dc this drug as of 2014 discussion   # Hypersensitivity Pneumonitis and ILD  - Potential etiologies - cockateil x 2 for 18 years through end 2012. 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 Denies mold but lives in house built in [place] with a "weird baselment: and has humidifier  - first noted on CT chest 10/15/09 following trip to [place] (PE negative) but not described in 2003 CT chest report  - autoimmune panel: 10/23/11: Negative  -  Uderwennt bronch 11/19/11  - non diagnostic  - VATS Nov 2013 (done after initially refusing)- CRONIC HYPERSENSITIVITY PNEUMONITIS WITH GRANULOMA. However, there is worrying trend of UIP pattern in the Upper lobes.         OV 06/17/2017  Chief Complaint  Rhonda presents with   Follow-up    [name]. Pt denies significant cough, denies CP/tightness, f/c/s.     She is better. Feels she does not need o2. Has been to [facility] for transplant clinic; felt too early. Seems higher dose prednisone helping but then she is also bettter t his time of the year. In May 2018 I was at [facility] and curbsided national thought leaders - feel that we could explore silent active GERD as a possibility of ILD getting wors with time.   OV 01/11/2018  Chief Complaint  Rhonda presents with   Follow-up    PFT done today.  Pt states she has been coughing x2 weeks since she returned from a trip to [place]. From coughing, pt has had pain in ribs. Pt states she has mild SOB which is stable due to rehab.  Pt states that Dr. Hal Hope is wanting to know if pt could possibly have eosinophilic esophagitis.   Went to [place]. Returned and has been  coughing x 2 weeks. Associated with this is some rib pain on right side. She feels she does not need o2. She feels she does not need pred/abx at this time. She also exposed to flu wit family   OV 02/08/2018  Chief Complaint  Rhonda presents with   Acute Visit    CXR done 02/07/18.  Pt states her throat is sore but not as bad as it was yesterday when in office and does have some left ear pain. Pt states she has some pain on right rib cage and has increased fatigue.     Rhonda [name] presents acutely.  I last saw her one month ago gave her Tamiflu for prophylaxis after flu exposure.  At that point in time she was already reducing her chronic stable prednisone of 10 mg for interstitial lung disease/hypersensitivity pneumonitis.  She had tapered herself slowly 3 or 4 weeks ago to 5 mg.  She says she was doing fairly okay but in the last 2 or 3 weeks she has had increased fatigue.  She says she goes daily swimming and then feels energized but then an hour later starts having fatigue which is more than usual.  Also in the daytime she has random fatigue.  In terms of her respiratory symptoms these are stable and in fact slightly better in terms of shortness of  breath and cough but she is having sore throat without any fever and some left-sided otalgia.  In addition she is having right-sided chest pains that are more than usual.  The chest pains have been reported before.  These are specifically at localized spots along the previous incision several years ago for interstitial lung disease.  They are tender as well to touch.  This pain is worse.  In addition she is also being bothered by arthralgia in her hand and left hip.  The left hip arthralgias old with a hand arthralgias new.  She is worried about pneumothorax recurrence and so she had a chest x-ray yesterday that shows no pneumothorax.  She has stable interstitial lung disease changes but in my personal visualization these interstitial lung disease changes  are worse compared to several years ago.  We do know that she has slowly worsening interstitial lung disease.  In terms of a walking desaturation test this shows stability since last visit.  She has seen GI for acid reflux and has pH probe study coming up  In talking to her she tells me she has been on metformin for over a month or 2.  This is new and is meant for pre-diabetes.  Apparently her hemoglobin A1c is always below 6 but is above 5.5.  Walking desaturation test on 02/08/2018 185 feet x 3 laps on ROOM AIR:  did walk normal pace with forehead probe desaturate. Rest pulse ox was 99%, final pulse ox was 93%. HR response 72/min at rest to 93/min at peak exertion. Rhonda [name]  Did not Desaturate < 88% . Rhonda [name] yes did  Desaturated </= 3% points. Rhonda [name] yes did get tachyardic   Dg Chest 2 View  Result Date: 02/07/2018 CLINICAL DATA:  Chest wall pain for 2 weeks EXAM: CHEST - 2 VIEW COMPARISON:  CT 08/20/2017, radiograph 10/11/2015 FINDINGS: Coarse interstitial pattern consistent with pulmonary fibrosis, similar distribution compared to prior. No focal pulmonary opacity or pleural effusion. Stable cardiomediastinal silhouette with aortic atherosclerosis. No pneumothorax. Degenerative changes of the spine. IMPRESSION: No active cardiopulmonary disease. Similar appearance of diffuse pulmonary fibrosis. Negative for a pneumothorax. Electronically Signed   By: Jasmine Pang M.D.   On: 02/07/2018 14:26    OV 04/13/2018  Chief Complaint  Rhonda presents with   Follow-up    Pt had pre spiro and DLCO PFT prior to OV. Pt has increase of productive cough-thick white in last week. Pt has some wheezing, and allergy issues.   Rhonda [name] presents for routine follow-up.  I saw her acutely just approximately 2 months ago.  Then end of April 2019 she saw a nurse practitioner again acutely and was given antibiotic and prednisone.  She says she felt better after that but in the  last week or so due to increased pollen load in the community and also moving furniture because her daughter is relocating out of her home she started having more cough.  There is no change in her dyspnea with the cough is really bad.  This is despite Singulair and Chlor-Trimeton.  Those things to help but just take the edge off.  She feels that the pollen making the cough worse.  There is no change in dyspnea.  Walking desaturation test documented below his baseline.  She had pulmonary function test today but the [facility] shows significant decline compared to February 2019 and she feels surprised by this.  DLCO is baseline unchanged and a walking desaturation test  limited in the office is also unchanged.  She does not have any fever but has significant postnasal drip.  She has not followed up at [facility] transplant clinic or the allergist recently.  OV 08/16/2018  Subjective:  Rhonda ID: Rhonda [name], female , DOB: [birth year] , age 48 y.o. , MRN: [id number] , ADDRESS: [address] [place] [address]   08/16/2018 -   Chief Complaint  Rhonda presents with   Follow-up    PFT performed today.  Pt states she has had a lot of mucus the month of September 2019 so she has been taking the Chlor-Trimeton. States other than that she has been able to do a lot more activities and has been swimming a lot more. Pt states she believes the SOB is stable.     Rhonda [name] 72 y.o. -continues to do well.  She is taking higher dose of prednisone 10 mg/day.  Also the summer usually she is doing well.  She is not having much of a cough.  Lung function test shows improvement compared to tests done earlier this year.  In fact she is almost as good as much 2018.  Still compared to 2014-2016 she has progressive lung disease.  Walking desaturation test is stable.  She needs a high-dose flu shot but we do not have stock today.  She has lost some weight intentionally with better dietary control.  She is  exercising regularly swimming.  We discussed ofev  potential future therapy if the drug is approved for this indication.  We will know study results in a year or so.  She is open to the idea but is worried somewhat about her irritable bowel syndrome flaring up.  She is no longer following at the [facility] lung transplant clinic given stability lung function  Other issues: She always has right-sided chest wall pain to the site of lung biopsy.  The only way this is been resolved as by her limiting her use of bra .  Also her irritable bowel syndrome is under control but in case it flares up she wants a refill of dicyclomine     OV 12/15/2018  Subjective:  Rhonda ID: Rhonda [name], female , DOB: 06-07-1951 , age 62 y.o. , MRN: [id number] , ADDRESS: [address] [place] [address]   12/15/2018 -   Chief Complaint  Rhonda presents with   Follow-up    Pt states she has been doing better since last visit with TP but states she has been having pain in her rib cage x2 years but states it has become more intense.     Rhonda Rhonda [name] 72 y.o. -returns for follow-up of her ILD due to hypersensitivity pneumonitis.  She did a clinic visit also for research with the ILD-pro registry study.  Since I last saw her in September 2019 she had a flareup and required higher doses of prednisone.  Since then she is returned to baseline of 5 mg prednisone and she feels good.  She is stable.  Walking desaturation test shows stability.  We have been discussing over the last few months about taking nintedanib based on new data and progressive non--IPF ILD's.  She has trepidation for this because of irritable bowel syndrome.  After much discussion she is willing to try 100 mg once daily for a month and then escalate to 100 mg twice daily which is the therapeutic dose.  She will do this once after insurance approval which we  suspect will happen over the next few months.  Her main issue is that her right  sided infra-axillary chest pain is getting worse.  This started after her surgical lung biopsy and pneumothorax on the right side.  It is random and happens with twisting.  But now days it happens once every few days.  Previously it used to happen once every few weeks.  Sometimes it is excruciating but then she is left in pain for a few days.  Applying Abrol sudden twisting of her body makes it worse.  Is a lancinating pain.  She did try gabapentin some years ago for chronic cough .  She did tolerate the gabapentin well.  She does not remember if it helped the pain or not but clearly back and the pain was much less intense.  She is willing to try this again.  I recommended a CT scan of the chest but she is going to have radiation for a CT coronary angiogram because of high levels of coronary artery calcification.  Therefore written to the radiologist to see if he can look at the lung parenchyma with a CT angiogram    OV 05/09/2019  Subjective:  Rhonda ID: Rhonda [name], female , DOB: [birth year] , age 10 y.o. , MRN: [id number] , ADDRESS: [address] [place] [address]   05/09/2019 -   Chief Complaint  Rhonda presents with   ILD (Interstitial Lung Disease)     Rhonda Rhonda [name] 72 y.o. -follow-up for chronic care physician pneumonitis on daily 5 mg prednisone.  She has problems with right-sided chest wall pain that is neuropathic.  She tells me since her last visit January 2020 she has been isolating and social distancing because of the pandemic.  Overall she is been stable.  Her symptom scores are mild and documented below.  She continues with prednisone.  She was supposed to have pulmonary function test but this was held because of the pandemic.  Her walking desaturation test shows she is stable.  She is supposed to have pulmonary function test tomorrow but wanted to see me today.  In the interim she did have a cardiac CT scan of the chest that shows 94th percentile of coronary calcium.   She could not get a CT angiogram done because of PVC.  A nuclear medicine stress test was done and I reviewed this result and it was normal.  A Holter test is pending.  She is kind of nervous because of the ongoing COVID-19 situation and safety of doing a Holter test.  In terms of her right chest wall pain she is adapted to it.  She does not use any bra  OV 08/22/2019  Subjective:  Rhonda ID: Rhonda [name], female , DOB: [birth year] , age 68 y.o. , MRN: [id number] , ADDRESS: [address] [place] [address]   08/22/2019 -   Chief Complaint  Rhonda presents with   Follow-up    needs sx clearance for L hip replacement- not yet scheduled, pending clearance.     Rhonda lung disease chronic hypersensitive pneumonitis on chronic daily prednisone 5 mg/day  Rhonda Rhonda [name] 72 y.o. -presents for follow-up of her interstitial lung disease due to chronic hypersensitive pneumonitis.  After last visit in June 2020 given her PFT stability and also her aversion to the potential side effects with nintedanib she decided to just continue with prednisone daily 5 mg/day.  Given the pandemic she is walking 1 mile a day without stopping.  Her interstitial lung disease symptom score is actually slightly better.  Overall she reports stability in her interstitial lung disease.  She has a new issue of getting preoperative clearance for her left hip.  She says the left hip is bone-on-bone and she does not want to take Aleve.  She prefers to have surgery.  Dr. Durene Romans is the surgeon.  Surgery date has not been set.  She is very functional.  She has normal renal function.  Normal nutritional status.  No anemia.  No respiratory infections in the last 1 month.  She is not on oxygen.  Age 52.  The surgeries in the left hip and the duration of surgery is under 3 hours.  Anesthesia will be general.      OV 01/30/2020  Subjective:  Rhonda ID: Rhonda [name], female , DOB: 1951-09-07 , age 63 y.o. , MRN:  [id number] , ADDRESS: [address] [place] [address] Rhonda lung disease chronic hypersensitive pneumonitis on chronic daily prednisone 5 mg/day  01/30/2020 -   Chief Complaint  Rhonda presents with   Follow-up    PFT performed 3/8.  Pt states she believes she has been going downhill for the past 2 months. Pt was started on O2 at last OV and wears it prn. Pt does have an occ cough.     Rhonda RANYA RICHBERG 72 y.o. -presents for follow-up of interstitial lung disease secondary to chronic hypersensitive pneumonitis.  She maintains herself on prednisone 5 mg/day.  In the interim she has had hip surgery and this did wonders for her.  She is able to walk longer distances.  However she does notice that her dyspnea has declined.  She says since January 2021 his symptoms have declined particularly with cough.  Usually in the winter she goes through a cycle of severe cough and exacerbation that requires higher levels of prednisone.  She has been social distancing well and masking well.  She states there is absolutely no mold or water any antigen exposure at home.  But she feels it is her allergies acting up and making her more symptomatic.  She has had a Covid vaccine and is wondering about either going back to swimming or starting pulmonary rehabilitation.  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  In fact when we walked her today with the mask she did drop down to 89%.  This is a decline for her.  She had pulmonary function test and this shows significant decline in FVC and DLCO.  In review of this that.  Where she has declined this much but is always bounce back with steroids.  At last visit we discussed nintedanib for her as a way to prevent progression of her ILD but given her concerns of GI side effects and the presence of irritable bowel syndrome she is declined.  Infective have this conversation with her  including her joining the [employer] at [facility] but she has always been worried about the GI side effects with nintedanib.  This time she is more open to it because of the decline in lung function.  She wants to see an allergist  She also told me that she continues to have a restrictive chest pain particularly on the right side lower area.  She no longer wears a brassiere it has been nearly 3 years since she had a high-resolution CT chest.  She  is open to having one now.  Last liver function test was normal in January 2020.  Last renal function was normal in November 2020.      OV 03/19/2020  Subjective:  Rhonda ID: Rhonda [name], female , DOB: 1951-09-15 , age 46 y.o. , MRN: [id number] , ADDRESS: [address] [place] [address]  Chronic hypersensitive pneumonitis interstitial lung disease on prednisone daily 10 mg 03/19/2020 -   Chief Complaint  Rhonda presents with   Follow-up    Pt states she is doing better since last visit and states her breathing has also improved.     Rhonda Rhonda [name] 72 y.o. -at last visit in March 2021 there was decline in lung function.  Therefore we started nintedanib.  She took 1 tablet of 150 mg and then had immediate abdominal pain and fever.  Therefore she is decided against taking this.  I also support this decision because I do not think she will tolerate nintedanib.  We did extensive interstitial lung disease question a history again at that time.  She had taken 1 dose of nitrofurantoin.  Have indicated to her that she should never take this medicine again.  In addition discovered that she might have some feather jackets at home she has now gotten rid of it.  She is now doing pulmonary rehabilitation.  Her overnight oxygen desaturation test is negative.  At pulmonary rehabilitation she tells me she does not drop below 91% and 1 time she went to 89%.  Otherwise overall doing great.  Her cardiologist is increased her Lopressor.  Her daughter is getting  married and she is busy with the wedding planning this is being emotionally stressful for her.  Her allergist has increased her recommendations and this is also helping her.  Her allergist is retiring and she wanted recommendations for new allergist.  I have recommended Dr. Madie Reno and Dr. Gary Fleet.  Incidentally that right-sided chest pain that she had is also better with increased prednisone of 10 mg/day.  She feels prednisone is working really well for her.    OV 08/29/2020   Subjective:  Rhonda ID: Rhonda [name], female , DOB: October 01, 1951, age 2 y.o. years. , MRN: [id number],  ADDRESS: [address] [place] [address] PCP  Dois Davenport, MD Providers : Treatment Team:  Attending Provider: Kalman Shan, MD Rhonda Care Team: Dois Davenport, MD as PCP - General (Family Medicine) Rollene Rotunda, MD as PCP - Cardiology (Cardiology) Kalman Shan, MD as Consulting Physician (Pulmonary Disease) Daleen Squibb, Jesse Sans, MD (Inactive) (Cardiology) Linna Darner, RD as Dietitian (Family Medicine) Rollene Rotunda, MD as Consulting Physician (Cardiology)  Follow-up chronic hypersensitive pneumonitis on chronic prednisone 10 mg/day.  Last high-resolution CT chest March 2021.  Intolerant to nintedanib.  Chief Complaint  Rhonda presents with   Follow-up    Pt states she had been doing well since last visit up until 3-4 weeks ago and started having problems with her breathing and has had to use her O2 with activities. Pt also has been coughing a lot and is getting up clear phlegm. Pt also has had some mild wheeze.       Rhonda Rhonda [name] 72 y.o. -returns for follow-up.  Since I last saw her her daughter is now married.  She had a great wedding.  She was able to enjoy this and dance quite well.  She barely used her oxygen her effort tolerance was good.  Some 3 weeks [place].  Apparently she had difficulty getting her oxygen tank  to clinic to the electrical port.  She noticed with exertion a pulse ox dropping to the 70s.  She said even before she left a few days prior to that she started having increasing cough.  She notices that these cough cycles get exacerbated in the fall in the winter.  Looking back at her pulmonary function test there is always a dip between October and March each year.  Between 2015 and 2017 there was a decline and since then there is a waxing and waning quality with dips in the winter in the fall season.  Each time she responds to prednisone.  Most recent pulmonary function test in August 2021 is actually improved compared to March 2021.  However today she is feeling worse.  She feels something is in her airway.  She feels dry air in the house and the animals bringing leaves from outside is contributing.  She says she feels better upstairs than downstairs where the dogs.  She again denies any mold or mildew.  Denies any feather jacket exposure.  She feels she will benefit from another round of prednisone.  She feels in the past antibiotics have not helped her as much as prednisone.   In terms of chronic ILD HP: She not tolerate nintedanib in the past.  I explained to her that I am concerned about progression and her decline in functional quality and the risk for that.  We discussed pirfenidone as an alternative.  Discussed the fact is less well studied although have no reason to see why it would not be effective.  Explained the side effect profile.  She is willing to try this.  We decided to go donor samples from the Rhonda support group  Oxygen qualification: We walked her and she qualified for oxygen today.  Is for portable oxygen with [employer]  Other issue: Spring allergies: -Her allergist is retired.  We discussed options of referral.  Referred her to Dr. Aris Georgia referred to     OV 01/14/2021  Subjective:  Rhonda ID: Rhonda [name], female , DOB: 10-23-1951 , age 32 y.o. , MRN: [id number] ,  ADDRESS: [address] [place] [address] PCP Dois Davenport, MD Rhonda Care Team: Dois Davenport, MD as PCP - General (Family Medicine) Rollene Rotunda, MD as PCP - Cardiology (Cardiology) Kalman Shan, MD as Consulting Physician (Pulmonary Disease) Daleen Squibb, Jesse Sans, MD (Inactive) (Cardiology) Linna Darner, RD as Dietitian (Family Medicine) Rollene Rotunda, MD as Consulting Physician (Cardiology)  This Provider for this visit: Treatment Team:  Attending Provider: Kalman Shan, MD    01/14/2021 -   Chief Complaint  Rhonda presents with   Follow-up    Nauseous, loss of appetite    Follow-up chronic hypersensitive pneumonitis on prednisone 5 mg/day and also pirfenidone submaximal dose of 2 pills 3 times daily since October 2021.  On ILD-pro registry protocol  - failed ofev   - failed esbrit due to side effects Larrie Kass 2022  She is also on Breo  Rhonda Rhonda [name] 72 y.o. -returns for follow-up.  She is now on pirfenidone along with low-dose prednisone.  She feels the pirfenidone is not reacting well with her at all.  She has intermittent loss of taste at least twice a week.  She has loss of appetite and she has forces herself to eat.  She also has fatigue.  She says that  if I strongly recommended then she will continue to soldier on with it.  Otherwise she feels less short of breath.  Pulmonary function test reviewed and is actually stable/slightly improved.  She says the cough is extremely well controlled except today after the pulmonary function test she was coughing quite a bit.  She is looking forward to going back to swimming.  She has had a Covid vaccine in booster and also status post EVUSHELD monoclonal antibody prophylaxis.  We discussed the possibility about possibility of enrolling in inhaled nitric oxide device/administration study.  This is to see if people can improve in the shortness of breath scale and also physical activity.  She took the consent form and  is deliberating.  She thinks she might find the study and inconvenience to her lifestyle where she is wanting to be more active.  We went over several details of this research protocol  She has a new symptom of like itching sensation deep in the right flank.  She feels it is not the skin it is from deep inside.  She is wondering if it could be related to pirfenidone.  Started after going on pirfenidone.  Explained to her that I am not seeing this in other patients with pirfenidone but will have to be open minded.  She does not apply Vaseline to her skin in the winter.  We discussed the possibility this could be dry skin related but she does not have other features of dry skin.  Nevertheless she is willing to try Vaseline.   Results for [facility]   OV 07/08/2021  Subjective:  Rhonda ID: Rhonda [name], female , DOB: 02-12-1951 , age 70 y.o. , MRN: [id number] , ADDRESS: [address] [place] [address] PCP Dois Davenport, MD Rhonda Care Team: Dois Davenport, MD as PCP - General (Family Medicine) Rollene Rotunda, MD as PCP - Cardiology (Cardiology) Kalman Shan, MD as Consulting Physician (Pulmonary Disease) Daleen Squibb, Jesse Sans, MD (Inactive) (Cardiology) Linna Darner, RD as Dietitian (Family Medicine) Rollene Rotunda, MD as Consulting Physician (Cardiology)  This Provider for this visit: Treatment Team:  Attending Provider: Kalman Shan, MD    07/08/2021 -  ACUTE VISIT Chief Complaint  Rhonda presents with   Acute Visit    Pt states that she did start taking zpak yesterday 8/15 and said her cough is doing some better after being on second day of abx and said that she started prednisone today 8/16. States that she still has increased SOB. Pt did take a covid test which came back negative.   Follow-up chronic hypersensitive pneumonitis on prednisone 5 mg/day and also pirfenidone submaximal dose of 2 pills 3 times daily since October 2021.  On ILD-pro registry protocol -  failed ofev   - failed esbrit due to side effects Larrie Kass 2022  She is also on Breo   Rhonda ANALAURA BESTER 72 y.o. -this is an acute visit.  Last seen in February 2022 after that she was supposed to see me back in 3 months.  But this visit has been scheduled acutely.  She tells me that early in May 2022 she went to [facility]  They rented a house.  She believes that the bedroom had some mold.  She did not visually confirmed the mold but husband might have.  She says shortly after going there and spending a week she started having worsening cough with hoarseness of voice.  This cough is persisted all along and the hoarseness has persisted  as well.  The in June 2022 she had a short course of steroids that did not help.  She believes the dosing was not strong enough.  In July 2022 she had some back issues and was given 30 mg of prednisone for approximately 10 days with a taper and this helped her back.  But all along the cough is persisted and severe hoarseness is persisted.  Then approximately a week ago she started getting shortness of breath along with yellow sputum.  The cough got worse .  She states even at baseline she brings her a lot of white sputum and has to cough a lot.  [place] and a couple of days into the illness.  At this point in time she started noticing she was more easily desaturating.  She is not able to swim.  She says walking to the car her pulse ox dropped to 84%.  Then the yellow phlegm started getting worse.  She called in yesterday and we prescribed Z-Pak and a prednisone burst.  She says now with the Z-Pak the sputum color is improving.  A week ago she also saw Dr. Doran Heater for chronic cough.  100 mg 3 times daily of gabapentin has been prescribed.  She is slowly increasing it.  She is worried about the side effects of this drug.  Apparently her vocal cord had ulcers from repeated coughing.  ILD symptom score shows worsening.  In the office  she is very hoarse   Of note she stopped her pirfenidone in February/March 2022 because of side effects - gerd  She uses oxygen with exertion.  There is no leg swelling or hemoptysis.  Started z pak yesterday Started 12d pred taper yesterday     PFT   OV 08/14/2021  Subjective:  Rhonda ID: Rhonda [name], female , DOB: 1951-03-21 , age 47 y.o. , MRN: [id number] , ADDRESS: [address] [place] [address] PCP Dois Davenport, MD Rhonda Care Team: Dois Davenport, MD as PCP - General (Family Medicine) Rollene Rotunda, MD as PCP - Cardiology (Cardiology) Kalman Shan, MD as Consulting Physician (Pulmonary Disease) Daleen Squibb, Jesse Sans, MD (Inactive) (Cardiology) Linna Darner, RD as Dietitian (Family Medicine) Rollene Rotunda, MD as Consulting Physician (Cardiology)  This Provider for this visit: Treatment Team:  Attending Provider: Kalman Shan, MD  Follow-up chronic hypersensitive pneumonitis on prednisone 5 mg/day and also pirfenidone submaximal dose of 2 pills 3 times daily since October 2021.  On ILD-pro registry protocol - failed ofev   - failed esbrit due to side effects Larrie Kass 2022  She is also on Breo  08/14/2021 -   Chief Complaint  Rhonda presents with   Follow-up    Pt states she is feeling better since last visit. States she did receive her lighterweight POC which has been working well for her.     Rhonda APHRODITE WILLEFORD 72 y.o. -seen last month with worsening symptoms acute bronchitis/flare.  Given Z-Pak and prednisone.  She is significantly better.  She tells me that even though she is better and his subjective symptom score is better below.  She still feels not back to her May 2022 baseline.  She says in the neighborhood she has to use more oxygen.  She is interested in a backpack for oxygen.  She is not able to swim as well as she used to.  She says acid reflux is under better control now after seeing Dr.  Marcellino in ENT.  She is wondering  about starting pirfenidone she definitely does not want to do nintedanib again.  But even with pirfenidone she is reluctant.  We discussed CellCept because of progression but because of the immunosuppression she is reluctant.  We resolved that she would increase the prednisone to 10 mg/day and reassess.  She was supposed to have done a spirometry today but it did not happen.  She will have it done again in 4 weeks.  Have requested a schedule for that.    The main concern right now even though is better is that she is not to her baseline as of a year ago and she feels like she is having slowly progressive disease interstitial lung disease/chronic HP.       OV 10/03/2021  Subjective:  Rhonda ID: Rhonda [name], female , DOB: [birth year] , age 42 y.o. , MRN: [id number] , ADDRESS: [address] [place] [address] PCP Dois Davenport, MD Rhonda Care Team: Dois Davenport, MD as PCP - General (Family Medicine) Rollene Rotunda, MD as PCP - Cardiology (Cardiology) Kalman Shan, MD as Consulting Physician (Pulmonary Disease) Daleen Squibb, Jesse Sans, MD (Inactive) (Cardiology) Linna Darner, RD as Dietitian (Family Medicine) Rollene Rotunda, MD as Consulting Physician (Cardiology)  This Provider for this visit: Treatment Team:  Attending Provider: Kalman Shan, MD    10/03/2021 -   Chief Complaint  Rhonda presents with   Follow-up    Pt states her breathing has become worse since last visit. States she is having to use her O2 almost all the time now with exertion.    Follow-up chronic hypersensitive pneumonitis on prednisone 5 mg/day and also pirfenidone submaximal dose of 2 pills 3 times daily since October 2021.  On ILD-pro registry protocol - failed ofev due to side efect  - failed esbrit due to sde effects marvh 2022  -Last echo 2018  -Last high-res CT March 2021. -> Nov 2022 stable between 2  She is also on Breo  Rhonda PRIANKA SUTHARD 72 y.o. -returns for  follow-up.  In this visit she is categorically saying that she is significantly worse.  This point in particular relationship to dyspnea on exertion.  Her weight itself is stable.  She is currently on 5 mg prednisone per day [last time I thought I increased it to 10 mg/day].  Nevertheless she says other interim changes is that she has had slow weight gain.  BMI is 30.  She started semaglutide for hemoglobin A 1.C 6.5.  Is also to help weight.  She was also after seeing Dr. Doran Heater for her cough and significant amount of anti-PPI therapy.  She said this made her diarrhea worse.  She then reduced it and currently is taking omeprazole 20 mg/day in the daytime and 40 mg at night and in between Pepcid as well.  She stopped the gabapentin.  She feels after reducing the PPI the diarrhea is improved.  The cough is not as bad as it was even after stopping gabapentin but she continues to be fatigued.  She complains of bloating.  She feels her stomach is full.  She feels acid reflux is worse.  Acid reflux is worse after reducing the dose of H2 blockade.  She also has early satiety.  She also states when she coughs she vomits.  She feels she needs to see Dr. Rayetta Pigg.  I have messaged him and he hasagreed for him or his PA to see her soon.  Nevertheless symptoms are significantly worse as seen on the symptom score below.  She is also reporting increased tachycardia with exertion and apparently Dr. Antoine Poche adjusted her beta-blocker.  She has upcoming appointment with them.    She says that at times when she desaturates easily at home even for minimal exertion.  We walked her today with a forehead probe and she actually did much better than expected going down to 89% only at the end of 3 labs [this is actually an improvement from the recent past versus stability].  She might have an erratic finger pulse ox probe at home.   Her pulmonary function shows a significant decline in DLCO.  Last echocardiogram 2018.  Last  high-resolution CT chest March 2021.  Never had right heart catheterization.  Hemoglobin normal 14.7 g% in August 2022.           OV 10/21/2021  Subjective:  Rhonda ID: Rhonda [name], female , DOB: [birth year] , age 41 y.o. , MRN: [id number] , ADDRESS: [address] [place] [address] PCP Dois Davenport, MD Rhonda Care Team: Dois Davenport, MD as PCP - General (Family Medicine) Rollene Rotunda, MD as PCP - Cardiology (Cardiology) Kalman Shan, MD as Consulting Physician (Pulmonary Disease) Daleen Squibb, Jesse Sans, MD (Inactive) (Cardiology) Linna Darner, RD as Dietitian (Family Medicine) Rollene Rotunda, MD as Consulting Physician (Cardiology)  This Provider for this visit: Treatment Team:  Attending Provider: Kalman Shan, MD  Type of visit: Video Circumstance: COVID-19 national emergency Identification of Rhonda Rhonda [name] with 06-08-1951 and MRN [id number] - 2 person identifier Risks: Risks, benefits, limitations of telephone visit explained. Rhonda understood and verbalized agreement to proceed Anyone else on call: no Rhonda location:  her home This provider location: [address]; [place]; [place] [address]. [place]. [phone]    10/21/2021 -     Follow-up chronic hypersensitive pneumonitis on prednisone 5 mg/day and also pirfenidone submaximal dose of 2 pills 3 times daily since October 2021.  On ILD-pro registry protocol - failed ofev due to side efect  - failed esbrit due to sde effects marvh 2022  -Last echo 2018  -Last high-res CT March 2021.  She is also on Breo Rhonda Rhonda [name] 72 y.o. -returns for follow-up via video visit to discuss test results.  She says currently she is just doing PPI lower dose 20 mg omeprazole in the daytime and famotidine at night.  With this her diarrhea is better.  Also bloating is better.  Correlating with this for she feels her lungs are stable.  She does say  that even now when she bends down she can desaturate.  She is not using oxygen at night.  Her primary care is testing her over no at night to see if she really needs it at night.  Nevertheless she feels somewhat better.  She had echocardiogram because of concern of pulmonary hypertension and there is no evidence of pulmonary hypertension.  She had high-resolution CT chest which the radiologist was described as minimally progressive since 18 months earlier in March 2021.  She had pulmonary function test that shows the FVC to be stable in the last few to several months but the DLCO to show slight decline.  Nevertheless the pattern at least compared to 2 years ago is 1 of progressive ILD on the pulmonary function test.  Certainly the hypoxia with easy exertion compared to a few years ago or a year ago is also  a sign that her ILD is getting worse.  She is likely stable or stabilized in the last few to several months.  We discussed going challenge again with nintedanib or pirfenidone but she is not interested.  We discussed doing CellCept and discussed the side effect profile of this.  She is not interested.  We discussed transplant referral for evaluation now that she is progressing so she can get plugged in and have a good conversation.  She is reflected on this from the last visit and she is not interested  We discussed the acid reflux controlled she feels she is at a good place right now with good acid reflux controlled with PPI in the morning and H2 blockade at night.  She has seen GI and at this point I believe they are not going to do pH probe.  I reviewed the note.  She continues on Breo and prednisone 5 mg/day which she plans to continue.  She has heard about the promedior trial with IV Pnetraxin for IPF.  This might be a study for non-- IPF progressive phenotype.  She prefers to do the study because it bypasses the GI route and it is an IV study.  We do not know if the study is going to happen.  We do  not know if you are going to be a site.  If these things all get aligned then maybe we could consider that.        OV 07/21/2022  Subjective:  Rhonda ID: Rhonda [name], female , DOB: [birth year] , age 64 y.o. , MRN: [id number] , ADDRESS: [address] [place] [address] PCP Dois Davenport, MD Rhonda Care Team: Dois Davenport, MD as PCP - General (Family Medicine) Rollene Rotunda, MD as PCP - Cardiology (Cardiology) Kalman Shan, MD as Consulting Physician (Pulmonary Disease) Daleen Squibb, Jesse Sans, MD (Inactive) (Cardiology) Linna Darner, RD as Dietitian (Family Medicine) Rollene Rotunda, MD as Consulting Physician (Cardiology)  This Provider for this visit: Treatment Team:  Attending Provider: Kalman Shan, MD  She is also on [facility]  07/21/2022 -   Chief Complaint  Rhonda presents with   Follow-up    PFT performed today.  Pt states she has been doing good since last visit.     Rhonda ENYAH NEDD 72 y.o. -returns for follow-up.  Overall in the summer months she is doing well.  She went to the [address] of [place] in [place].  In the areas where there was a lot of [place] and clean air she was able to walk down the beach 200 steps and including climb up with the oxygen without desaturation and feeling good.  But when she got to [facility] where there was pollution she started having cough.  She currently feels really good.  She feels well symptom scores are improved.  Walking desaturation test is also improved.  She attributes this to some weight loss and overall healthy living.  She is on Ozempic that is helping her lose weight.  She is swimming.  She does have some cough recently for the last week because of allergies.  Her dog Nicholes Calamity passed away.  Therefore she is taking care of the other dog and this is increasing her allergies and cough but still overall symptoms are improved.  She continues on prednisone 5 mg/day.  Currently she not  interested in any prednisone burst.  Social: She is expecting to be a grandmother in a few months.  OV 10/27/2022  Subjective:  Rhonda ID: Rhonda [name], female , DOB: 05-Jan-1951 , age 26 y.o. , MRN: [id number] , ADDRESS: [address] [place] [address] PCP Dois Davenport, MD Rhonda Care Team: Dois Davenport, MD as PCP - General (Family Medicine) Rollene Rotunda, MD as PCP - Cardiology (Cardiology) Kalman Shan, MD as Consulting Physician (Pulmonary Disease) Daleen Squibb, Jesse Sans, MD (Inactive) (Cardiology) Linna Darner, RD as Dietitian (Family Medicine) Rollene Rotunda, MD as Consulting Physician (Cardiology)  This Provider for this visit: Treatment Team:  Attending Provider: Kalman Shan, MD    10/27/2022 -   Chief Complaint  Rhonda presents with   Acute Visit    Pt has been having complaints of increased SOB and feels like she is having a flare up. Pt states she took zpak and prednisone about a month ago. States she has had some desats in oxygen levels.     Rhonda AFIYAH RACHFORD 72 y.o. -presents for follow-up.  This is an acute visit.  She is now a grandmother.  The granddaughter's name is Laurie Panda.  She is pretty excited about that.  However she is having deterioration in her symptoms.  She says when she saw me in August 2023 she was actually quite well but she thought looking back she might of started with a cough.  [place] did travel west but then starting September 2023 she started having worsening cough which typically happens in the fall with the leaves get dry.  I prescribed 8-10-day prednisone burst along with Z-Pak.  She states the prednisone always helps her.  However she feels that the prednisone I prescribed was not probably strong enough.  Since then she is having a deterioration in the cough.  She also feels more fatigued.  There is white sputum coming with this.  She is also having worsening shortness of breath for the last 2 or 3 weeks.  She  feels this time the shortness of breath is different and is the more dominant symptoms.  Usually cough is the more dominant symptoms.  The deterioration in shortness of breath really suddenly in the last few weeks.  There are some associated exertional chest pain.  She did see Dr. Antoine Poche recently.  I reviewed his note.  She has had a previous normal stress test so he felt reassured this was not cardiac.  He did contemplate stopping the metoprolol given her "reactive airway disease" [she has hypersensitivity pneumonitis and can occasionally wheeze but I did leave her on metoprolol being a better once because of the beneficial effects on her heart rate with exertion].  Her symptom score showed deterioration  Her walking desaturation test also shows significant deterioration.  Similar to September 2022 but a lot worse.  This the worst its ever been.  There is no fever     OV 12/10/2022  Subjective:  Rhonda ID: Rhonda [name], female , DOB: 1951/01/31 , age 52 y.o. , MRN: [id number] , ADDRESS: [address] [place] [address] PCP Dois Davenport, MD Rhonda Care Team: Dois Davenport, MD as PCP - General (Family Medicine) Rollene Rotunda, MD as PCP - Cardiology (Cardiology) Kalman Shan, MD as Consulting Physician (Pulmonary Disease) Daleen Squibb, Jesse Sans, MD (Inactive) (Cardiology) Linna Darner, RD as Dietitian (Family Medicine) Rollene Rotunda, MD as Consulting Physician (Cardiology)  This Provider for this visit: Treatment Team:  Attending Provider: Kalman Shan, MD      12/10/2022 -   Chief Complaint  Rhonda presents with  Follow-up    Pft results from yesterday. Pt states that 10 mg of prednisone is helping.      Rhonda SHAQUERA SANDSTEDT 72 y.o. -here for follow-up.  After the last visit she called saying 5 mg of prednisone was not helping her.  She told me this time that at 50 mg of prednisone she feels great but then as she tapers and she gets into 10 mg  prednisone she starts getting symptoms and at 5 mg symptoms are significantly worse.  She then called and she is back up to 10 mg and she feels better with just very mild symptoms.  In fact walking desaturation test is more stable her exam is more stable.  She feels she is got hypersensitive airways.  I did indicate to her that it is possible there is allergic or eosinophilic inflammatory component.  However her blood eosinophils have always been normal.  But then it is Mast by chronic prednisone.  Currently she is on 10 mg of prednisone.  We took a shared decision for her to stay on 10 mg prednisone acknowledging the risk because this seems to be the best balance between symptom relief and quality of life.  Even exercise hypoxemia is better.  She did notice that recently in the last few months perfume particularly bothering her.  But otherwise she is stable.  She plans to revisit with her allergist Dr. Eileen Stanford   Current pulmonary function test was reviewed and is stable.      01/20/2023 Acute OV : ILD -chronic hypersensitivity pneumonitis and chronic respiratory failure Rhonda complains over the last 6 months that she has been having waxing and waning of symptoms with increased cough shortness of breath and decreased activity tolerance.  She has been treated with 3 steroid boost.  She says each time she has gotten much better and actually felt the best that she has felt in a long time.  But within a couple weeks of returning to prednisone 5mg  daily her symptoms started to return.  Most recently started noticing that her breathing was not doing as good 3 weeks ago.  Started to have more cough, congestion thick mucus decreased activity tolerance and increased oxygen demands.  Today in the office walk test shows Rhonda is unable to maintain O2 saturations on pulsed oxygen.  Requires continuous flow oxygen. Rhonda was started on a prednisone burst yesterday at 40 mg to be tapered by 10 mg every 4 days.   She does feel like she is some better since starting on the higher dose of prednisone yesterday.  Chest x-ray today shows chronic ILD changes.  No acute process. She denies any fever, chest, orthopnea, edema, calf pain.  OV 02/11/2023  Subjective:  Rhonda ID: Rhonda [name], female , DOB: 1950/12/01 , age 81 y.o. , MRN: [id number] , AD[address]ESS: [address] [place] [address] PCP Dois Davenport, MD Rhonda Care Team: Dois Davenport, MD as PCP - General (Family Medicine) Rollene Rotunda, MD as PCP - Cardiology (Cardiology) Kalman Shan, MD as Consulting Physician (Pulmonary Disease) Daleen Squibb, Jesse Sans, MD (Inactive) (Cardiology) Linna Darner, RD as Dietitian (Family Medicine) Rollene Rotunda, MD as Consulting Physician (Cardiology)  This Provider for this visit: Treatment Team:  Attending Provider: Kalman Shan, MD   Follow-up chronic hypersensitive pneumonitis on prednisone 5 mg/day and also pirfenidone submaximal dose of 2 pills 3 times daily since October 2021.  On ILD-pro registry protocol - failed ofev due to side efect  -  failed esbrit due to sde effects  March 2022   -Last echo 2018  -Last high-res CT Nov 222 -> mrach 2024: no change  - declind lung tx referral 2023   Lst eCHO Nov 2022  02/11/2023 -   Chief Complaint  Rhonda presents with   Follow-up    Increase mucous, cough and oxygen drops when she gets down to 10 mg of prednisone symptoms start to come back.  She remains on 10 mg and states it is not holding her.     Rhonda DRYSTAL NERSESIAN 72 y.o. -returns for follow-up.  She tells me that compared to a year's this year she is requiring more prednisone.  Even when I saw her last time she felt she needed a baseline of 10 mg prednisone.  We did that.  For years she been on 5 mg prednisone.  She started allergy shots recently Imuran does not helping her.  She is needed quite a few prednisone burst and every time she goes to below 20 mg she feels she is  desaturating easily.  In the past it used to be that she could maintain at 5 mg/day.  Then late last year she needed 10 mg low-dose of prednisone per day.  Now she feels even  prednisone 10 mg is not "holding it".  She says when she was on 40 mg prednisone she would climb stairs on room air and the pulse ox would stay at 95% and above.  Now she is on 10 mg/day of prednisone and she is requiring 5 L of portable oxygen to climb up 1 flight of stairs without desaturations.  She is very worried about her disease.  We did get a high-resolution CT chest and there is no no progression but clinically she is not behaving like that.  Last echocardiogram November 2022 and there was no elevated pulmonary artery pressures.  She is willing to get another echocardiogram.     We discussed the fact she is guarding progressive ILD despite what the CT says.  She has got active inflammation.  I indicated to her that we will commit to an intermediate course of prolonged prednisone at higher dose.  She is willing to do that and accept the risk.  Also indicated to her that we would need to add another steroid sparing and other immunomodulatory agent and would need to check safety labs.  We discussed there is no data on Rituxan for this particular issue of chronic HP progressive pulmonary fibrosis but there is data on CellCept.  She does not like the idea that CellCept can cause diarrhea.  We went over the immunosuppressive and long-term cancer risk.  Did indicate to her that benefit outweighs the risk at this point.  She is agreeable to safety labs and be open to CellCept.  She is committing to CellCept.  But there is a possibility a month from now if she is doing well on the higher dose prednisone and handling it well she might feel differently about CellCept.  We again discussed about lung transplant as an option and she is not interested.       OV 04/12/2023  Subjective:  Rhonda ID: Rhonda [name], female , DOB:  Dec 17, 1950 , age 15 y.o. , MRN: [id number] , ADDRESS: [address] [place] [id number] PCP Dois Davenport, MD Rhonda Care Team: Dois Davenport, MD as PCP - General (Family Medicine) Rollene Rotunda, MD as PCP - Cardiology (Cardiology) Kalman Shan, MD as Consulting  Physician (Pulmonary Disease) Daleen Squibb, Jesse Sans, MD (Inactive) (Cardiology) Linna Darner, RD as Dietitian (Family Medicine) Rollene Rotunda, MD as Consulting Physician (Cardiology)  This Provider for this visit: Treatment Team:  Attending Provider: Kalman Shan, MD    04/12/2023 -  No chief complaint on file.    Rhonda MELANDIE WARTH 72 y.o. -       SYMPTOM SCALE - ILD Sept 2020 01/30/2020  08/29/2020  01/14/2021 146# 07/08/2021 147# - off esbriet. Sic past week 08/14/2021 149# - pred only. No antifibrotic 10/03/2021 148# - pred 5mg  07/21/2022 137# 10/27/2022   O2 use  o2 with ex o2 withe ex 2L     2LL o2 with ex 2L with ex 2 laps on RA  Shortness of Breath  0 -> 5 scale with 5 being worst (score 6 If unable to do)         At rest 0 0 1 0 1 0 1 0 1  Simple tasks - showers, clothes change, eating, shaving 01 1 1 1.5 2 1 2 1 2   Household (dishes, doing bed, laundry) 0 3 3 3 4 2  3.5 2 3   Shopping 1 1 1 2 2 1 1 1 2   Walking level at own pace 1 2 2 1 2 2 3 1 2   Walking up Stairs 2 3 3 3 4 3 4 3 4   Total (30-36) Dyspnea Score 5 10 11  10.5 15 9  14.5 8 14   How bad is your cough? 1 3 2 2 2 2 2 2 3   How bad is your fatigue 1 2 2 3 2  0 3 1 2   How bad is nausea  0 0 3 0 0 1 0 0  How bad is vomiting?   0 0 0 0 0 1 0 0  How bad is diarrhea?  00 1 0 0 0 1 0 0  How bad is anxiety?  0 0 0 00 0 2 0 00  How bad is depression  0 2 1 1  0 1 0 0  00 0 0 Simple office walk 185 feet x  3 laps goal with forehead probe 04/13/2018  08/16/2018  12/15/2018  05/09/2019  08/22/2019  01/30/2020  08/29/2020  01/14/2021  07/08/2021  08/14/2021  10/03/2021  07/21/2022  10/27/2022  02/11/2023   O2 used Room air Room air Room  air Room air Room air Room air ra ra ra ra ra ra ra   Number laps completed 3 3 3 3 2  stopped at 2 die to hip pain 3 laps - no hip pain following hip surgery 3 3 3  attempted byt did  only 2 3 bu stopped at 2 3 and did all 3 3 Ddi onl 2 las   Comments about pace good Moderate pace Normal, hip bothering     avg pace avg  avg avg avg   Resting Pulse Ox/HR 98% and 73/min 98% and HR 77/min 99% and HR 61/min 98% and HR 70/min 98% 98% and 75/min 97% and 67/mi 94% and 80/min 96% and HR 72 98% and 71 100% and HR 82 98% ad HR 72 100% and HR 79   Final Pulse Ox/HR 91% and 91/min 93% and 92.min 94% and 92/min 93% and 98/min 91%  89% and 93/,imn 88% and 94 89% ad 88.nin 88% at 2nd  lap end HR 92 86% and 96 89% and HR 91 93% and HR 87 84% and HR 104   Desaturated </= 88% no no no  no   yes almost yes yes almst no yes   Desaturated <= 3% points yes yes Yes, 5 points Yes, 5 points    Yes, 5 points Yes, 8 points Yes, 12 pints Yes 11 pot 5 pots 16 pont   Got Tachycardic >/= 90/min yes yes yes yes     yes yes yes no    Symptoms at end of test none none Hip pain and very mild dyspnea  Stopped due to hip pain Moderate duyspnea with mask Mild to moderate dyspnea  Severe dyspnea Mild dyspnea Severe dyspnea Very mild dyspneax moderate   Miscellaneous comments x x    No hip pain    Similar to lastime Symptoms out of proportion ? better worse      PFT     Latest Ref Rng & Units 03/08/2023   10:49 AM 12/09/2022   11:48 AM 07/21/2022    9:50 AM 01/06/2022    9:51 AM 10/03/2021    8:59 AM 01/14/2021    1:51 PM 11/06/2020   11:03 AM  PFT Results  FVC-Pre L 1.45  1.53  1.54  1.58  1.48  1.49  1.41   FVC-Predicted Pre % 64  66  67  67  63  62  59   Pre FEV1/FVC % % 93  87  91  90  92  92  93   FEV1-Pre L 1.35  1.32  1.40  1.43  1.37  1.37  1.31   FEV1-Predicted Pre % 80  77  81  81  78  77  73   DLCO uncorrected ml/min/mmHg 8.11  9.64  8.16  8.62  8.92  11.26  9.70   DLCO UNC% % 50  59  50  53  55  69  59   DLCO corrected  ml/min/mmHg 7.81  9.41  7.86  8.62  8.92  11.26  10.53   DLCO COR %Predicted % 48  58  48  53  55  69  64   DLVA Predicted % 65  81  64  75  78  92  89   TLC L 3.02         TLC % Predicted % 70         RV % Predicted % 70              has a past medical history of Allergy, Arthritis, Asthma, Cataract, Coronary artery calcification seen on CAT scan (08/19/2017), DDD (degenerative disc disease), thoracic, Depression, DOE (dyspnea on exertion), Fatty liver, GERD (gastroesophageal reflux disease), H/O steroid therapy, Heart palpitations (02/28/2015), Helicobacter pylori ab+, Hemorrhoids, Hiatal hernia, High cholesterol, History of chronic bronchitis, History of migraine, History of MRSA infection, Hyperlipidemia, mixed (08/19/2017), Hyperplastic colon polyp (2007), IBS (irritable bowel syndrome), Inguinal hernia, Insulin resistance, Interstitial lung disease (HCC), MVP (mitral valve prolapse), Pneumonia, Pneumonitis, hypersensitivity (HCC), PONV (postoperative nausea and vomiting), Pre-diabetes, Pulmonary fibrosis (HCC), PVC (premature ventricular contraction) (08/19/2017), Rapid heart rate, Squamous cell carcinoma of skin, Tinnitus, right ear, and Vocal cord ulcer.   reports that she has never smoked. She has never used smokeless tobacco.  Past Surgical History:  Procedure Laterality Date   11 HOUR PH STUDY N/A 02/21/2018   Procedure: 24 HOUR PH STUDY;  Surgeon: Napoleon Form, MD;  Location: [facility];  Service: Endoscopy;  Laterality: N/A;   BREAST BIOPSY Right 2009   BIOPSY, pt denies   BREAST BIOPSY Left 2003   Benign    CATARACT  EXTRACTION Left    CESAREAN SECTION     COLONOSCOPY     ESOPHAGEAL MANOMETRY N/A 02/21/2018   Procedure: ESOPHAGEAL MANOMETRY (EM);  Surgeon: Napoleon Form, MD;  Location: [facility];  Service: Endoscopy;  Laterality: N/A;   FOOT FRACTURE SURGERY  2006 or 2007   right   HYMENECTOMY     LUNG BIOPSY  09/28/2012   Procedure: LUNG BIOPSY;  Surgeon:  Loreli Slot, MD;  Location: [facility] OR;  Service: Thoracic;  Laterality: N/A;  lung biopsies tims three   SQUAMOUS CELL CARCINOMA EXCISION Left    left arm   TOTAL HIP ARTHROPLASTY Right 09/10/2015   Procedure: RIGHT TOTAL HIP ARTHROPLASTY ANTERIOR APPROACH;  Surgeon: Durene Romans, MD;  Location: [facility];  Service: Orthopedics;  Laterality: Right;   TOTAL HIP ARTHROPLASTY Left 10/03/2019   Procedure: TOTAL HIP ARTHROPLASTY ANTERIOR APPROACH;  Surgeon: Durene Romans, MD;  Location: [facility];  Service: Orthopedics;  Laterality: Left;  70 mins   TUBAL LIGATION     UPPER GI ENDOSCOPY     VIDEO ASSISTED THORACOSCOPY  09/28/2012   Procedure: VIDEO ASSISTED THORACOSCOPY;  Surgeon: Loreli Slot, MD;  Location: [facility] OR;  Service: Thoracic;  Laterality: Right;   VIDEO BRONCHOSCOPY  11/19/2011   Procedure: VIDEO BRONCHOSCOPY WITH FLUORO;  Surgeon: Kalman Shan, MD;  Location: [facility];  Service: Endoscopy;;    Allergies  Allergen Reactions   Atorvastatin Other (See Comments)    Leg pain   Betadine [Povidone Iodine] Other (See Comments)    blisters   Codeine Nausea And Vomiting   Garlic Diarrhea   Hydrocodone Nausea And Vomiting   Macrobid [Nitrofurantoin] Other (See Comments)   Ofev [Nintedanib] Other (See Comments)    Abdominal pain   Onion Diarrhea   Other Other (See Comments)   Rosuvastatin Other (See Comments)    Leg pain   Shellfish Allergy Nausea And Vomiting   Sulfa Antibiotics    Sulfonamide Derivatives Other (See Comments)    headaches    Immunization History  Administered Date(s) Administered   Fluad Quad(high Dose 65+) 07/17/2019   Hepatitis A 05/23/2010   Hepatitis B 06/23/2006   Influenza Split 08/11/2012, 08/14/2017   Influenza Whole 09/08/2011   Influenza, High Dose Seasonal PF 08/24/2016, 07/24/2017, 08/31/2018, 08/15/2020, 09/26/2020, 09/12/2021, 08/24/2022   Influenza,inj,Quad PF,6+ Mos 09/07/2013   Influenza-Unspecified 09/26/2014, 07/27/2015   MMR  05/23/2010   Moderna Sars-Covid-2 Vaccination 07/30/2021, 01/01/2022   PFIZER(Purple Top)SARS-COV-2 Vaccination 12/16/2019, 01/08/2020, 07/18/2020, 08/11/2022   Pneumococcal Conjugate-13 03/15/2014   Pneumococcal Polysaccharide-23 01/11/2018, 09/26/2020, 08/26/2021   Respiratory Syncytial Virus Vaccine,Recomb Aduvanted(Arexvy) 07/28/2022   Td 02/22/2003   Tdap 10/28/2012   Zoster Recombinat (Shingrix) 02/15/2017, 05/17/2017   Zoster, Live 12/04/2010    Family History  Problem Relation Age of Onset   Asthma Mother    Osteoarthritis Mother    Dementia Mother 54   Lymphoma Father    Diabetes Father    Hypertension Sister    Heart disease Sister    Kidney disease Sister    Allergic rhinitis Daughter    Ovarian cancer Maternal Aunt    Breast cancer Paternal Aunt    Breast cancer Paternal Aunt    Emphysema Maternal Grandmother    Asthma Maternal Grandmother    Lung disease Maternal Grandfather    Bone cancer Paternal Grandfather    Breast cancer Cousin    Breast cancer Cousin    Breast cancer Cousin    Colon cancer Neg Hx  Angioedema Neg Hx    Eczema Neg Hx    Immunodeficiency Neg Hx    Urticaria Neg Hx      Current Outpatient Medications:    acetaminophen (TYLENOL) 500 MG tablet, Tylenol Extra Strength, Disp: , Rfl:    albuterol (PROAIR HFA) 108 (90 Base) MCG/ACT inhaler, Inhale 2 puffs into the lungs every 4 (four) hours as needed for wheezing. Or coughing spells.  You may use 2 Puffs 5-10 minutes before exercise., Disp: 1 Inhaler, Rfl: 3   Alpha-D-Galactosidase (BEANO PO), Take 1 tablet by mouth as needed (if eating onion or garlic). , Disp: , Rfl:    Ascorbic Acid (VITAMIN C) 1000 MG tablet, Take 1,000 mg by mouth daily., Disp: , Rfl:    ASHWAGANDHA PO, Take 2 capsules by mouth daily., Disp: , Rfl:    azelastine (ASTELIN) 0.1 % nasal spray, Place 1 spray into both nostrils at bedtime., Disp: , Rfl:    azithromycin (ZITHROMAX) 250 MG tablet, Two tablets today, then one  tablet daily till gone, Disp: 6 tablet, Rfl: 0   Bacillus Coagulans-Inulin (ALIGN PREBIOTIC-PROBIOTIC PO), Take 2 tablets by mouth daily at 6 (six) AM. chewable, Disp: , Rfl:    Calcium Carb-Cholecalciferol (CALCIUM 500 + D PO), Take 2 tablets by mouth daily at 6 (six) AM., Disp: , Rfl:    chlorpheniramine (CHLOR-TRIMETON) 4 MG tablet, Take 2 mg by mouth 2 (two) times daily. Takes half tablet twice daily, Disp: , Rfl:    cholecalciferol (VITAMIN D3) 25 MCG (1000 UNIT) tablet, Take 1,000 Units by mouth every other day., Disp: , Rfl:    dicyclomine (BENTYL) 10 MG capsule, Take 1 capsule by mouth daily as needed., Disp: , Rfl:    diltiazem (CARDIZEM CD) 120 MG 24 hr capsule, Take 1 capsule (120 mg total) by mouth daily., Disp: 90 capsule, Rfl: 3   diphenhydrAMINE-PE-APAP (DELSYM COUGH/COLD NIGHT TIME) 12.5-5-325 MG/10ML LIQD, Take by mouth as needed., Disp: , Rfl:    EPINEPHrine 0.3 mg/0.3 mL IJ SOAJ injection, Inject into the muscle., Disp: , Rfl:    ezetimibe (ZETIA) 10 MG tablet, Take 1 tablet (10 mg total) by mouth daily. Please keep upcoming appt for future refills. Thank you., Disp: 90 tablet, Rfl: 3   fluticasone (FLONASE) 50 MCG/ACT nasal spray, Spray 2 sprays into each nostril every day., Disp: 48 g, Rfl: 1   fluticasone furoate-vilanterol (BREO ELLIPTA) 100-25 MCG/ACT AEPB, Inhale 1 puff into the lungs daily., Disp: 180 each, Rfl: 3   lactase (LACTAID) 3000 units tablet, Take by mouth., Disp: , Rfl:    levocetirizine (XYZAL) 5 MG tablet, Take 5 mg by mouth at bedtime., Disp: , Rfl:    metoprolol succinate (TOPROL XL) 25 MG 24 hr tablet, Take 1 tablet (25 mg total) by mouth daily. (Rhonda taking differently: Take 12.5 mg by mouth daily.), Disp: 90 tablet, Rfl: 1   montelukast (SINGULAIR) 10 MG tablet, TAKE 1 TABLET BY MOUTH EVERYDAY AT BEDTIME, Disp: 90 tablet, Rfl: 2   Multiple Vitamin (MULTIVITAMIN) tablet, Take 1 tablet by mouth daily., Disp: , Rfl:    nitroGLYCERIN (NITROSTAT) 0.4 MG SL  tablet, Place 1 tablet (0.4 mg total) under the tongue every 5 (five) minutes as needed for chest pain., Disp: 25 tablet, Rfl: 12   nystatin-triamcinolone (MYCOLOG II) cream, nystatin-triamcinolone 100,000 unit/g-0.1 % topical cream, Disp: , Rfl:    omeprazole (PRILOSEC OTC) 20 MG tablet, Take 20 mg by mouth daily., Disp: , Rfl:    OXYGEN, Inhale into the  lungs. Up to 6 liters as needed during the day and nite time use on/off, Disp: , Rfl:    OZEMPIC, 0.25 OR 0.5 MG/DOSE, 2 MG/1.5ML SOPN, Inject into the skin., Disp: , Rfl:    pravastatin (PRAVACHOL) 40 MG tablet, Take 1 tablet (40 mg total) by mouth every evening., Disp: 90 tablet, Rfl: 1   predniSONE (DELTASONE) 10 MG tablet, Take 1 tablet (10 mg total) by mouth daily with breakfast., Disp: 30 tablet, Rfl: 3   predniSONE (DELTASONE) 20 MG tablet, Take 1 tablet (20 mg total) by mouth daily with breakfast., Disp: 30 tablet, Rfl: 5   Sodium Chloride, Inhalant, 7 % NEBU, Use per nebulizer once a day to help clear sputum, Disp: 120 mL, Rfl: 3  Current Facility-Administered Medications:    methylPREDNISolone acetate (DEPO-MEDROL) injection (RADIOLOGY ONLY) 120 mg, 120 mg, Intramuscular, Once, Meier, Ivor Costa, MD      Objective:   There were no vitals filed for this visit.  Estimated body mass index is 26.8 kg/m as calculated from the following:   Height as of 03/18/23: 5' (1.524 m).   Weight as of 03/18/23: 137 lb 3.2 oz (62.2 kg).  @WEIGHTCHANGE @  There were no vitals filed for this visit.   Physical Exam   General: No distress. *** O2 at rest: *** Cane present: *** Sitting in wheel chair: *** Frail: *** Obese: *** Neuro: Alert and Oriented x 3. GCS 15. Speech normal Psych: Pleasant Resp:  Barrel Chest - ***.  Wheeze - ***, Crackles - ***, No overt respiratory distress CVS: Normal heart sounds. Murmurs - *** Ext: Stigmata of Connective Tissue Disease - *** HEENT: Normal upper airway. PEERL +. No post nasal drip         Assessment:     No diagnosis found.     Plan:     There are no Rhonda Instructions on file for this visit.    SIGNATURE    Dr. Kalman Shan, M.D., F.C.C.P,  Pulmonary and Critical Care Medicine Staff Physician, [facility] Center Director - Interstitial Lung Disease  Program  Pulmonary Fibrosis [facility] at [facility] [place], [place], [address]  Pager: [phone], If no answer or between  15:00h - 7:00h: call [phone] Telephone: [phone]  5:16 PM 04/12/2023

## 2023-04-13 ENCOUNTER — Encounter: Payer: Medicare PPO | Admitting: *Deleted

## 2023-04-13 ENCOUNTER — Ambulatory Visit: Payer: Medicare PPO | Admitting: Internal Medicine

## 2023-04-13 ENCOUNTER — Encounter: Payer: Self-pay | Admitting: Internal Medicine

## 2023-04-13 VITALS — BP 120/70 | HR 75 | Ht 60.0 in | Wt 134.4 lb

## 2023-04-13 DIAGNOSIS — J679 Hypersensitivity pneumonitis due to unspecified organic dust: Secondary | ICD-10-CM | POA: Diagnosis not present

## 2023-04-13 DIAGNOSIS — Z7952 Long term (current) use of systemic steroids: Secondary | ICD-10-CM | POA: Diagnosis not present

## 2023-04-13 DIAGNOSIS — Z006 Encounter for examination for normal comparison and control in clinical research program: Secondary | ICD-10-CM

## 2023-04-13 DIAGNOSIS — J849 Interstitial pulmonary disease, unspecified: Secondary | ICD-10-CM

## 2023-04-13 DIAGNOSIS — Z79899 Other long term (current) drug therapy: Secondary | ICD-10-CM | POA: Diagnosis not present

## 2023-04-13 DIAGNOSIS — Z5181 Encounter for therapeutic drug level monitoring: Secondary | ICD-10-CM

## 2023-04-13 NOTE — Research (Signed)
Title: Chronic Fibrosing Interstitial Lung Disease with Progressive Phenotype Prospective Outcomes (ILD-PRO) Registry    Protocol #: IPF-PRO-SUB, Clinical Trials # [id number], Sponsor: [employer]   Protocol Version Amendment 4 dated 12Sep2019  and confirmed current on  Consent Version for today's visit date of  Is [employer] IRB Approved Version 27 Oct 2018 Revised 27 Oct 2018   Objectives:  Describe current approaches to diagnosis and treatment of chronic fibrosing ILDs with progressive phenotype  Describe the natural history of chronic fibrosing ILDs with progressive phenotype  Assess quality of life from self-administered participant reported questionnaires for each disease group  Describe participant interactions with the healthcare system, describe treatment practices across multiple institutions for each disease group  Collect biological samples linked to well characterized chronic fibrosing ILDs with progressive phenotype to identify disease biomarkers  Collect data and biological samples that will support future research studies.                                            Key Inclusion Criteria: Willing and able to provide informed consent  Age ? 30 years  Diagnosis of a non-IPF ILD of any duration, including, but not limited to Idiopathic Non-Specific Interstitial Pneumonia (INSIP), Unclassifiable Idiopathic Interstitial Pneumonias (IIPs), Interstitial Pneumonia with Autoimmune Features (IPAF), Autoimmune ILDs such as Rheumatoid Arthritis (RA-ILD) and Systemic Sclerosis (SSC-ILD), Chronic Hypersensitivity Pneumonitis (HP), Sarcoidosis or Exposure-related ILDs such as asbestosis.  Chronic fibrosing ILD defined by reticular abnormality with traction bronchiectasis with or without honeycombing confirmed by chest HRCT scan and/or lung biopsy.  Progressive phenotype as defined by fulfilling at least one of the criteria below of fibrotic changes (progression set point)  within the last 24 months regardless of treatment considered appropriate in individual ILDs:  decline in FVC % predicted (% pred) based on >10% relative decline  decline in FVC % pred based on ? 5 - <10% relative decline in FVC combined with worsening of respiratory symptoms as assessed by the site investigator  decline in FVC % pred based on ? 5 - <10% relative decline in FVC combined with increasing extent of fibrotic changes on chest imaging (HRCT scan) as assessed by the site investigator  decline in DLCO % pred based on ? 10% relative decline  worsening of respiratory symptoms as well as increasing extent of fibrotic changes on chest imaging (HRCT scan) as assessed by the site investigator independent of FVC change.     Key Exclusion Criteria: Malignancy, treated or untreated, other than skin or early stage prostate cancer, within the past 5 years  Currently listed for lung transplantation at the time of enrollment  Currently enrolled in a clinical trial at the time of enrollment in this registry       Clinical Research Coordinator / Research RN note : This visit for Rhonda [name]  Subject [id number]. with DOB:11-Jun-1951  on 21/May/2024  for the above protocol is Visit/Encounter 10 and is for purpose of research.    Subject expressed continued interest and consent in continuing as a study subject. Subject confirmed that there was no change in contact information (e.g. address, telephone, email). Subject thanked for participation in research and contribution to science.     During this visit on 21/May/2024  , the subject completed the blood work and questionnaires per the above referenced protocol. Please refer to the subject's paper source binder for further  details.   Signed by Neita Garnet Clinical Research Coordinator [facility]  [place], [place] 21/May/2024    12:01pm

## 2023-04-15 ENCOUNTER — Ambulatory Visit
Admission: RE | Admit: 2023-04-15 | Discharge: 2023-04-15 | Disposition: A | Discharge status: Home / Self Care | Payer: Medicare PPO | Source: Ambulatory Visit | Attending: Family Medicine | Admitting: Family Medicine

## 2023-04-15 DIAGNOSIS — Z1231 Encounter for screening mammogram for malignant neoplasm of breast: Secondary | ICD-10-CM

## 2023-05-10 LAB — AFB CULTURE WITH SMEAR (NOT AT ARMC)
Acid Fast Culture: NEGATIVE
Acid Fast Smear: NEGATIVE

## 2023-05-19 ENCOUNTER — Telehealth: Payer: Self-pay

## 2023-05-19 NOTE — Telephone Encounter (Signed)
Rhonda [name], Please review this patient's chart as it is documented that the patient is a difficult airway, it is also in her record that she is on home O2- Please provide clarification if patient is appropriate or not for an [facility] procedure with Dr. Rhea Belton;  PV scheduled in room 52 on 05/26/2023.  Please Thank you Bre

## 2023-05-21 NOTE — Telephone Encounter (Signed)
Rhonda [name],   This pt is a documented difficult intubation and has severe COPD; her procedure will need to be done at the hospital.   Thanks,  Cathlyn Parsons

## 2023-05-24 NOTE — Telephone Encounter (Addendum)
Dr. Rhea Belton,  Does this patient need to have an OV prior to procedure at the hospital or is a direct [facility] advised?  Please review and advise as patient is currently scheduled to have a PV on 05/26/2023.  I have already cancelled his PV appt and [facility] procedure appts. Thank you Bre

## 2023-05-25 ENCOUNTER — Telehealth: Payer: Self-pay

## 2023-05-25 NOTE — Telephone Encounter (Signed)
Did Dr. Rhea Belton ever say if she needed and OV or not?

## 2023-05-25 NOTE — Telephone Encounter (Signed)
I do not see that he has answered

## 2023-05-25 NOTE — Telephone Encounter (Signed)
Needs an APP visit JMP

## 2023-05-25 NOTE — Telephone Encounter (Signed)
Call to pt and explain that she will need an office visit prior to sheduling her colonoscopy at the hospital, pt verb understanding, opening tomorrow in Amanda's sched, next available APP visit was 07/23/23

## 2023-05-25 NOTE — Telephone Encounter (Signed)
Patient called stating she needed to schedule her colonoscopy, I advised her that she needed to come to the appointment that was made for tomorrow and from that appointment they would schedule her for th procedure. Patient requested that I cancel the appointment and requested to speak to the nurse. Please advise.

## 2023-05-25 NOTE — Telephone Encounter (Signed)
OK I will make the appt, thank you.

## 2023-05-25 NOTE — Telephone Encounter (Signed)
This pt is scheduled for a pv tomorrow and has no date for their hospital colon, please advise, thank you

## 2023-05-26 ENCOUNTER — Ambulatory Visit: Payer: Medicare PPO | Admitting: Physician Assistant

## 2023-05-26 LAB — OPHTHALMOLOGY REPORT-SCANNED

## 2023-06-03 ENCOUNTER — Encounter: Payer: Self-pay | Admitting: Internal Medicine

## 2023-06-04 ENCOUNTER — Ambulatory Visit (INDEPENDENT_AMBULATORY_CARE_PROVIDER_SITE_OTHER): Payer: Medicare PPO | Admitting: Internal Medicine

## 2023-06-04 ENCOUNTER — Telehealth: Payer: Self-pay | Admitting: Internal Medicine

## 2023-06-04 DIAGNOSIS — J849 Interstitial pulmonary disease, unspecified: Secondary | ICD-10-CM | POA: Diagnosis not present

## 2023-06-04 LAB — PULMONARY FUNCTION TEST
DL/VA % pred: 85 %
DL/VA: 3.62 ml/min/mmHg/L
DLCO cor % pred: 64 %
DLCO cor: 10.82 ml/min/mmHg
DLCO unc % pred: 64 %
DLCO unc: 10.82 ml/min/mmHg
FEF 25-75 Pre: 2.16 L/sec
FEF2575-%Pred-Pre: 145 %
FEV1-%Pred-Pre: 73 %
FEV1-Pre: 1.3 L
FEV1FVC-%Pred-Pre: 120 %
FEV6-%Pred-Pre: 63 %
FEV6-Pre: 1.44 L
FEV6FVC-%Pred-Pre: 105 %
FVC-%Pred-Pre: 60 %
FVC-Pre: 1.44 L
Pre FEV1/FVC ratio: 91 %
Pre FEV6/FVC Ratio: 100 %

## 2023-06-04 NOTE — Telephone Encounter (Signed)
Rhonda   [name] [name]= - we are not going to offer RHC through study due to being out of window. But please do RHC through standard of care  Thanks   SIGNATURE    Dr. Kalman Shan, M.D., F.C.C.P,  Pulmonary and Critical Care Medicine Staff Physician, [facility] Center Director - Interstitial Lung Disease  Program  Pulmonary Fibrosis [facility] at [facility] [place], [place], [address]   Pager: [phone], If no answer  -> Check AMION or Try [phone] Telephone (clinical office): [phone] Telephone (research): [phone]  10:35 AM 06/04/2023

## 2023-06-04 NOTE — Patient Instructions (Signed)
Spiro/DLCO performed today.  

## 2023-06-04 NOTE — Progress Notes (Signed)
Spiro/DLCO performed today.  

## 2023-06-04 NOTE — Telephone Encounter (Signed)
  SPoke to her  A) advised her to test herself for 3-5 days from last exposure to son-in-law  B) ok for her to mask and take care of the baby because the odds of baby getting illness is very remote  C) she has not had right heart cath as yet - will let Dr Shirlee Latch know  Closing message. Nothing further needed     Immunization History  Administered Date(s) Administered   Fluad Quad(high Dose 65+) 07/17/2019   Hepatitis A 05/23/2010   Hepatitis B 06/23/2006   Influenza Split 08/11/2012, 08/14/2017   Influenza Whole 09/08/2011   Influenza, High Dose Seasonal PF 08/24/2016, 07/24/2017, 08/31/2018, 08/15/2020, 09/26/2020, 09/12/2021, 08/24/2022   Influenza,inj,Quad PF,6+ Mos 09/07/2013   Influenza-Unspecified 09/26/2014, 07/27/2015   MMR 05/23/2010   Moderna Sars-Covid-2 Vaccination 07/30/2021, 01/01/2022   PFIZER(Purple Top)SARS-COV-2 Vaccination 12/16/2019, 01/08/2020, 07/18/2020, 08/11/2022   Pneumococcal Conjugate-13 03/15/2014   Pneumococcal Polysaccharide-23 01/11/2018, 09/26/2020, 08/26/2021   Respiratory Syncytial Virus Vaccine,Recomb Aduvanted(Arexvy) 07/28/2022   Td 02/22/2003   Tdap 10/28/2012   Zoster Recombinant(Shingrix) 02/15/2017, 05/17/2017   Zoster, Live 12/04/2010         Latest Ref Rng & Units 03/08/2023   10:49 AM 12/09/2022   11:48 AM 07/21/2022    9:50 AM 01/06/2022    9:51 AM 10/03/2021    8:59 AM 01/14/2021    1:51 PM 11/06/2020   11:03 AM  PFT Results  FVC-Pre L 1.45  1.53  1.54  1.58  1.48  1.49  1.41   FVC-Predicted Pre % 64  66  67  67  63  62  59   Pre FEV1/FVC % % 93  87  91  90  92  92  93   FEV1-Pre L 1.35  1.32  1.40  1.43  1.37  1.37  1.31   FEV1-Predicted Pre % 80  77  81  81  78  77  73   DLCO uncorrected ml/min/mmHg 8.11  9.64  8.16  8.62  8.92  11.26  9.70   DLCO UNC% % 50  59  50  53  55  69  59   DLCO corrected ml/min/mmHg 7.81  9.41  7.86  8.62  8.92  11.26  10.53   DLCO COR %Predicted % 48  58  48  53  55  69  64   DLVA Predicted  % 65  81  64  75  78  92  89   TLC L 3.02         TLC % Predicted % 70         RV % Predicted % 70

## 2023-06-08 ENCOUNTER — Ambulatory Visit: Payer: Medicare PPO | Admitting: Internal Medicine

## 2023-06-08 ENCOUNTER — Encounter: Payer: Self-pay | Admitting: Internal Medicine

## 2023-06-08 VITALS — BP 116/62 | HR 81 | Temp 98.1°F | Ht 61.0 in | Wt 135.6 lb

## 2023-06-08 DIAGNOSIS — J679 Hypersensitivity pneumonitis due to unspecified organic dust: Secondary | ICD-10-CM

## 2023-06-08 DIAGNOSIS — J849 Interstitial pulmonary disease, unspecified: Secondary | ICD-10-CM | POA: Diagnosis not present

## 2023-06-08 DIAGNOSIS — J029 Acute pharyngitis, unspecified: Secondary | ICD-10-CM

## 2023-06-08 DIAGNOSIS — B370 Candidal stomatitis: Secondary | ICD-10-CM

## 2023-06-08 DIAGNOSIS — R053 Chronic cough: Secondary | ICD-10-CM

## 2023-06-08 DIAGNOSIS — Z7952 Long term (current) use of systemic steroids: Secondary | ICD-10-CM | POA: Diagnosis not present

## 2023-06-08 MED ORDER — PREDNISONE 10 MG PO TABS: 10.0000 mg | ORAL_TABLET | Freq: Every day | ORAL | 2 refills | Status: DC

## 2023-06-08 MED ORDER — FLUCONAZOLE 100 MG PO TABS: 100.0000 mg | ORAL_TABLET | Freq: Every day | ORAL | 0 refills | Status: AC

## 2023-06-08 NOTE — Patient Instructions (Addendum)
ICD-10-CM   1. Hypersensitivity pneumonitis (HCC)  J67.9     2. ILD (interstitial lung disease) (HCC)  J84.9     3. Current chronic use of systemic steroids  Z79.52     4. Chronic cough  R05.3     5. Sore throat  J02.9     6. Oral thrush  B37.0       Hypersensitivity pneumonitis (HCC) ILD (interstitial lung disease) (HCC) Current chronic use of systemic steroids  - ILD more stable with higher dose of steroids but higher dose of steroids causing high blood sugars and now also thrush  Plan - Okay to drop to 15 mg daily prednisone from 20 mg daily prednisone - Continue to use oxygen as needed for exertional hypoxemia -Follow-up with right heart catheterization standard of care to evaluate pulmonary hypertension   -Await call from Dr. Marca Ancona and team -Depending on course in the future CellCept/clinical trials are a care option  -Noted that you have been reluctant for CellCept because of side effect concerns -Reassess need for Bactrim prophylaxis at the future visit - Continue to control acid reflux well  Chronic cough Sore throat Oral thrush  -I believe you have oral thrush to explain the worsening cough sore throat 06/08/2023   Plan   fluconazole 200 mg loading dose, followed by 100 mg daily for 7days   Hyperglycemia  Plan  - per PCP   Followup - spirometry and dlco in 16 weeks  - 30 min visit in 16 weeks with DR Marchelle Gearing

## 2023-06-08 NOTE — Progress Notes (Signed)
2012 and 2013  #GE reflux with small hiatal hernia  - on ppi   #History of rapid heart rate not otherwise specified  - Start her on Lopressor  2008/2009 by primary care physician. History appears to correlate with onset of respiratory issues  - refuses to dc this drug as of 2014 discussion   # Hypersensitivity Pneumonitis and ILD  - Potential etiologies - cockateil x 2 for 18 years through end 2012. 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 Denies mold but lives in house built in [place] with a "weird baselment: and has humidifier  - first noted on CT chest 10/15/09 following trip to [place] (PE negative) but not described in 2003 CT chest report  - autoimmune panel: 10/23/11: Negative  -  Uderwennt bronch 11/19/11  - non diagnostic  - VATS Nov 2013 (done after initially refusing)- CRONIC HYPERSENSITIVITY PNEUMONITIS WITH GRANULOMA. However, there is worrying trend of UIP pattern in the Upper lobes.         OV 06/17/2017  Chief Complaint  Patient presents with   Follow-up    [name]. Pt denies significant cough, denies CP/tightness, f/c/s.     She is better. Feels she does not need o2. Has been to [facility] for transplant clinic; felt too early. Seems higher dose prednisone helping but then she is also bettter t his time of the year. In May 2018 I was at [facility] and curbsided national thought leaders - feel that we could explore silent active GERD as a possibility of ILD getting wors with time.   OV 01/11/2018  Chief Complaint  Patient presents with   Follow-up    PFT done today.  Pt states she has been coughing x2 weeks since she returned from a trip to [place]. From coughing, pt has had pain in ribs. Pt states she has mild SOB which is stable due to rehab.  Pt states that Dr. Hal Hope is wanting to know if pt could possibly have eosinophilic esophagitis.   Went to [place]. Returned  and has been coughing x 2 weeks. Associated with this is some rib pain on right side. She feels she does not need o2. She feels she does not need pred/abx at this time. She also exposed to flu wit family   OV 02/08/2018  Chief Complaint  Patient presents with   Acute Visit    CXR done 02/07/18.  Pt states her throat is sore but not as bad as it was yesterday when in office and does have some left ear pain. Pt states she has some pain on right rib cage and has increased fatigue.     Rhonda [name] presents acutely.  I last saw her one month ago gave her Tamiflu for prophylaxis after flu exposure.  At that point in time she was already reducing her chronic stable prednisone of 10 mg for interstitial lung disease/hypersensitivity pneumonitis.  She had tapered herself slowly 3 or 4 weeks ago to 5 mg.  She says she was doing fairly okay but in the last 2 or 3 weeks she has had increased fatigue.  She says she goes daily swimming and then feels energized but then an hour later starts having fatigue which is more than usual.  Also in the daytime she has random fatigue.  In terms of her respiratory symptoms these are stable and in fact slightly better in terms  of shortness of breath and cough but she is having sore throat without any fever and some left-sided otalgia.  In addition she is having right-sided chest pains that are more than usual.  The chest pains have been reported before.  These are specifically at localized spots along the previous incision several years ago for interstitial lung disease.  They are tender as well to touch.  This pain is worse.  In addition she is also being bothered by arthralgia in her hand and left hip.  The left hip arthralgias old with a hand arthralgias new.  She is worried about pneumothorax recurrence and so she had a chest x-ray yesterday that shows no pneumothorax.  She has stable interstitial lung disease changes but in my personal visualization these interstitial lung  disease changes are worse compared to several years ago.  We do know that she has slowly worsening interstitial lung disease.  In terms of a walking desaturation test this shows stability since last visit.  She has seen GI for acid reflux and has pH probe study coming up  In talking to her she tells me she has been on metformin for over a month or 2.  This is new and is meant for pre-diabetes.  Apparently her hemoglobin A1c is always below 6 but is above 5.5.  Walking desaturation test on 02/08/2018 185 feet x 3 laps on ROOM AIR:  did walk normal pace with forehead probe desaturate. Rest pulse ox was 99%, final pulse ox was 93%. HR response 72/min at rest to 93/min at peak exertion. [name]  Did not Desaturate < 88% . Rhonda [name] yes did  Desaturated </= 3% points. Rhonda [name] yes did get tachyardic   Dg Chest 2 View  Result Date: 02/07/2018 CLINICAL DATA:  Chest wall pain for 2 weeks EXAM: CHEST - 2 VIEW COMPARISON:  CT 08/20/2017, radiograph 10/11/2015 FINDINGS: Coarse interstitial pattern consistent with pulmonary fibrosis, similar distribution compared to prior. No focal pulmonary opacity or pleural effusion. Stable cardiomediastinal silhouette with aortic atherosclerosis. No pneumothorax. Degenerative changes of the spine. IMPRESSION: No active cardiopulmonary disease. Similar appearance of diffuse pulmonary fibrosis. Negative for a pneumothorax. Electronically Signed   By: Jasmine Pang M.D.   On: 02/07/2018 14:26    OV 04/13/2018  Chief Complaint  Patient presents with   Follow-up    Pt had pre spiro and DLCO PFT prior to OV. Pt has increase of productive cough-thick white in last week. Pt has some wheezing, and allergy issues.   Rhonda [name] presents for routine follow-up.  I saw her acutely just approximately 2 months ago.  Then end of April 2019 she saw a nurse practitioner again acutely and was given antibiotic and prednisone.  She says she felt better after  that but in the last week or so due to increased pollen load in the community and also moving furniture because her daughter is relocating out of her home she started having more cough.  There is no change in her dyspnea with the cough is really bad.  This is despite Singulair and Chlor-Trimeton.  Those things to help but just take the edge off.  She feels that the pollen making the cough worse.  There is no change in dyspnea.  Walking desaturation test documented below his baseline.  She had pulmonary function test today but the [facility] shows significant decline compared to February 2019 and she feels surprised by this.  DLCO is baseline unchanged and a  walking desaturation test limited in the office is also unchanged.  She does not have any fever but has significant postnasal drip.  She has not followed up at [facility] transplant clinic or the allergist recently.  OV 08/16/2018  Subjective:  Patient ID: Rhonda [name], female , DOB: 1950-12-19 , age 49 y.o. , MRN: [id number] , ADDRESS: [address] [place] [address]   08/16/2018 -   Chief Complaint  Patient presents with   Follow-up    PFT performed today.  Pt states she has had a lot of mucus the month of September 2019 so she has been taking the Chlor-Trimeton. States other than that she has been able to do a lot more activities and has been swimming a lot more. Pt states she believes the SOB is stable.     HPI YAMILETT ANASTOS 72 y.o. -continues to do well.  She is taking higher dose of prednisone 10 mg/day.  Also the summer usually she is doing well.  She is not having much of a cough.  Lung function test shows improvement compared to tests done earlier this year.  In fact she is almost as good as much 2018.  Still compared to 2014-2016 she has progressive lung disease.  Walking desaturation test is stable.  She needs a high-dose flu shot but we do not have stock today.  She has lost some weight intentionally with better dietary  control.  She is exercising regularly swimming.  We discussed ofev  potential future therapy if the drug is approved for this indication.  We will know study results in a year or so.  She is open to the idea but is worried somewhat about her irritable bowel syndrome flaring up.  She is no longer following at the [facility] lung transplant clinic given stability lung function  Other issues: She always has right-sided chest wall pain to the site of lung biopsy.  The only way this is been resolved as by her limiting her use of bra .  Also her irritable bowel syndrome is under control but in case it flares up she wants a refill of dicyclomine     OV 12/15/2018  Subjective:  Patient ID: Rhonda [name], female , DOB: 1951/01/10 , age 53 y.o. , MRN: [id number] , ADDRESS: [address] [place] [address]   12/15/2018 -   Chief Complaint  Patient presents with   Follow-up    Pt states she has been doing better since last visit with TP but states she has been having pain in her rib cage x2 years but states it has become more intense.     HPI Rhonda [name] 72 y.o. -returns for follow-up of her ILD due to hypersensitivity pneumonitis.  She did a clinic visit also for research with the ILD-pro registry study.  Since I last saw her in September 2019 she had a flareup and required higher doses of prednisone.  Since then she is returned to baseline of 5 mg prednisone and she feels good.  She is stable.  Walking desaturation test shows stability.  We have been discussing over the last few months about taking nintedanib based on new data and progressive non--IPF ILD's.  She has trepidation for this because of irritable bowel syndrome.  After much discussion she is willing to try 100 mg once daily for a month and then escalate to 100 mg twice daily which is the therapeutic dose.  She will do this once after insurance  approval which we suspect will happen over the next few months.  Her main issue is  that her right sided infra-axillary chest pain is getting worse.  This started after her surgical lung biopsy and pneumothorax on the right side.  It is random and happens with twisting.  But now days it happens once every few days.  Previously it used to happen once every few weeks.  Sometimes it is excruciating but then she is left in pain for a few days.  Applying Abrol sudden twisting of her body makes it worse.  Is a lancinating pain.  She did try gabapentin some years ago for chronic cough .  She did tolerate the gabapentin well.  She does not remember if it helped the pain or not but clearly back and the pain was much less intense.  She is willing to try this again.  I recommended a CT scan of the chest but she is going to have radiation for a CT coronary angiogram because of high levels of coronary artery calcification.  Therefore written to the radiologist to see if he can look at the lung parenchyma with a CT angiogram    OV 05/09/2019  Subjective:  Patient ID: Rhonda [name], female , DOB: 1951/04/12 , age 43 y.o. , MRN: [id number] , ADDRESS: [address] [place] [address]   05/09/2019 -   Chief Complaint  Patient presents with   ILD (Interstitial Lung Disease)     HPI Rhonda [name] 72 y.o. -follow-up for chronic care physician pneumonitis on daily 5 mg prednisone.  She has problems with right-sided chest wall pain that is neuropathic.  She tells me since her last visit January 2020 she has been isolating and social distancing because of the pandemic.  Overall she is been stable.  Her symptom scores are mild and documented below.  She continues with prednisone.  She was supposed to have pulmonary function test but this was held because of the pandemic.  Her walking desaturation test shows she is stable.  She is supposed to have pulmonary function test tomorrow but wanted to see me today.  In the interim she did have a cardiac CT scan of the chest that shows 94th percentile of  coronary calcium.  She could not get a CT angiogram done because of PVC.  A nuclear medicine stress test was done and I reviewed this result and it was normal.  A Holter test is pending.  She is kind of nervous because of the ongoing COVID-19 situation and safety of doing a Holter test.  In terms of her right chest wall pain she is adapted to it.  She does not use any bra  OV 08/22/2019  Subjective:  Patient ID: Rhonda [name], female , DOB: [birth year] , age 38 y.o. , MRN: [id number] , ADDRESS: [address] [place] [address]   08/22/2019 -   Chief Complaint  Patient presents with   Follow-up    needs sx clearance for L hip replacement- not yet scheduled, pending clearance.     Patient lung disease chronic hypersensitive pneumonitis on chronic daily prednisone 5 mg/day  HPI Rhonda [name] 72 y.o. -presents for follow-up of her interstitial lung disease due to chronic hypersensitive pneumonitis.  After last visit in June 2020 given her PFT stability and also her aversion to the potential side effects with nintedanib she decided to just continue with prednisone daily 5 mg/day.  Given the pandemic she is walking 1 mile a  day without stopping.  Her interstitial lung disease symptom score is actually slightly better.  Overall she reports stability in her interstitial lung disease.  She has a new issue of getting preoperative clearance for her left hip.  She says the left hip is bone-on-bone and she does not want to take Aleve.  She prefers to have surgery.  Dr. Durene Romans is the surgeon.  Surgery date has not been set.  She is very functional.  She has normal renal function.  Normal nutritional status.  No anemia.  No respiratory infections in the last 1 month.  She is not on oxygen.  Age 47.  The surgeries in the left hip and the duration of surgery is under 3 hours.  Anesthesia will be general.      OV 01/30/2020  Subjective:  Patient ID: Rhonda [name], female , DOB: [birth year] ,  age 73 y.o. , MRN: [id number] , ADDRESS: [address] [place] [address] Patient lung disease chronic hypersensitive pneumonitis on chronic daily prednisone 5 mg/day  01/30/2020 -   Chief Complaint  Patient presents with   Follow-up    PFT performed 3/8.  Pt states she believes she has been going downhill for the past 2 months. Pt was started on O2 at last OV and wears it prn. Pt does have an occ cough.     HPI Rhonda [name] 72 y.o. -presents for follow-up of interstitial lung disease secondary to chronic hypersensitive pneumonitis.  She maintains herself on prednisone 5 mg/day.  In the interim she has had hip surgery and this did wonders for her.  She is able to walk longer distances.  However she does notice that her dyspnea has declined.  She says since January 2021 his symptoms have declined particularly with cough.  Usually in the winter she goes through a cycle of severe cough and exacerbation that requires higher levels of prednisone.  She has been social distancing well and masking well.  She states there is absolutely no mold or water any antigen exposure at home.  But she feels it is her allergies acting up and making her more symptomatic.  She has had a Covid vaccine and is wondering about either going back to swimming or starting pulmonary rehabilitation.  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  In fact when we walked her today with the mask she did drop down to 89%.  This is a decline for her.  She had pulmonary function test and this shows significant decline in FVC and DLCO.  In review of this that.  Where she has declined this much but is always bounce back with steroids.  At last visit we discussed nintedanib for her as a way to prevent progression of her ILD but given her concerns of GI side effects and the presence of irritable bowel syndrome she is declined.  Infective have this  conversation with her including her joining the [employer] at [facility] [employer] but she has always been worried about the GI side effects with nintedanib.  This time she is more open to it because of the decline in lung function.  She wants to see an allergist  She also told me that she continues to have a restrictive chest pain particularly on the right side lower area.  She no longer wears a brassiere it has been nearly 3 years since she had a high-resolution  CT chest.  She is open to having one now.  Last liver function test was normal in January 2020.  Last renal function was normal in November 2020.      OV 03/19/2020  Subjective:  Patient ID: Rhonda [name], female , DOB: [birth year] , age 36 y.o. , MRN: [id number] , ADDRESS: [address] [place] [address]  Chronic hypersensitive pneumonitis interstitial lung disease on prednisone daily 10 mg 03/19/2020 -   Chief Complaint  Patient presents with   Follow-up    Pt states she is doing better since last visit and states her breathing has also improved.     HPI Rhonda [name] 72 y.o. -at last visit in March 2021 there was decline in lung function.  Therefore we started nintedanib.  She took 1 tablet of 150 mg and then had immediate abdominal pain and fever.  Therefore she is decided against taking this.  I also support this decision because I do not think she will tolerate nintedanib.  We did extensive interstitial lung disease question a history again at that time.  She had taken 1 dose of nitrofurantoin.  Have indicated to her that she should never take this medicine again.  In addition discovered that she might have some feather jackets at home she has now gotten rid of it.  She is now doing pulmonary rehabilitation.  Her overnight oxygen desaturation test is negative.  At pulmonary rehabilitation she tells me she does not drop below 91% and 1 time she went to 89%.  Otherwise overall doing great.  Her cardiologist is increased her Lopressor.   Her daughter is getting married and she is busy with the wedding planning this is being emotionally stressful for her.  Her allergist has increased her recommendations and this is also helping her.  Her allergist is retiring and she wanted recommendations for new allergist.  I have recommended Dr. Madie Reno and Dr. Gary Fleet.  Incidentally that right-sided chest pain that she had is also better with increased prednisone of 10 mg/day.  She feels prednisone is working really well for her.    OV 08/29/2020   Subjective:  Patient ID: Rhonda [name], female , DOB: 1951-09-17, age 51 y.o. years. , MRN: [id number],  ADDRESS: [address] [place] [address] PCP  Dois Davenport, MD Providers : Treatment Team:  Attending Provider: Kalman Shan, MD Patient Care Team: Dois Davenport, MD as PCP - General (Family Medicine) Rollene Rotunda, MD as PCP - Cardiology (Cardiology) Kalman Shan, MD as Consulting Physician (Pulmonary Disease) Daleen Squibb, Jesse Sans, MD (Inactive) (Cardiology) Linna Darner, RD as Dietitian (Family Medicine) Rollene Rotunda, MD as Consulting Physician (Cardiology)  Follow-up chronic hypersensitive pneumonitis on chronic prednisone 10 mg/day.  Last high-resolution CT chest March 2021.  Intolerant to nintedanib.  Chief Complaint  Patient presents with   Follow-up    Pt states she had been doing well since last visit up until 3-4 weeks ago and started having problems with her breathing and has had to use her O2 with activities. Pt also has been coughing a lot and is getting up clear phlegm. Pt also has had some mild wheeze.       HPI TATIA PETRUCCI 72 y.o. -returns for follow-up.  Since I last saw her her daughter is now married.  She had a great wedding.  She was able to enjoy this and dance quite well.  She barely used her oxygen her effort tolerance was good.  Some 3  [place].  Apparently she had difficulty  getting her oxygen tank to clinic to the electrical port.  She noticed with exertion a pulse ox dropping to the 70s.  She said even before she left a few days prior to that she started having increasing cough.  She notices that these cough cycles get exacerbated in the fall in the winter.  Looking back at her pulmonary function test there is always a dip between October and March each year.  Between 2015 and 2017 there was a decline and since then there is a waxing and waning quality with dips in the winter in the fall season.  Each time she responds to prednisone.  Most recent pulmonary function test in August 2021 is actually improved compared to March 2021.  However today she is feeling worse.  She feels something is in her airway.  She feels dry air in the house and the animals bringing leaves from outside is contributing.  She says she feels better upstairs than downstairs where the dogs.  She again denies any mold or mildew.  Denies any feather jacket exposure.  She feels she will benefit from another round of prednisone.  She feels in the past antibiotics have not helped her as much as prednisone.   In terms of chronic ILD HP: She not tolerate nintedanib in the past.  I explained to her that I am concerned about progression and her decline in functional quality and the risk for that.  We discussed pirfenidone as an alternative.  Discussed the fact is less well studied although have no reason to see why it would not be effective.  Explained the side effect profile.  She is willing to try this.  We decided to go donor samples from the patient support group  Oxygen qualification: We walked her and she qualified for oxygen today.  Is for portable oxygen with [employer]  Other issue: Spring allergies: -Her allergist is retired.  We discussed options of referral.  Referred her to Dr. Aris Georgia referred to     OV 01/14/2021  Subjective:  Patient ID: Rhonda [name], female , DOB: 1950-11-27 , age 37 y.o. ,  MRN: [id number] , ADDRESS: [address] [place] [address] PCP Dois Davenport, MD Patient Care Team: Dois Davenport, MD as PCP - General (Family Medicine) Rollene Rotunda, MD as PCP - Cardiology (Cardiology) Kalman Shan, MD as Consulting Physician (Pulmonary Disease) Daleen Squibb, Jesse Sans, MD (Inactive) (Cardiology) Linna Darner, RD as Dietitian (Family Medicine) Rollene Rotunda, MD as Consulting Physician (Cardiology)  This Provider for this visit: Treatment Team:  Attending Provider: Kalman Shan, MD    01/14/2021 -   Chief Complaint  Patient presents with   Follow-up    Nauseous, loss of appetite    Follow-up chronic hypersensitive pneumonitis on prednisone 5 mg/day and also pirfenidone submaximal dose of 2 pills 3 times daily since October 2021.  On ILD-pro registry protocol  - failed ofev   - failed esbrit due to side effects Larrie Kass 2022  She is also on Breo  HPI Rhonda [name] 72 y.o. -returns for follow-up.  She is now on pirfenidone along with low-dose prednisone.  She feels the pirfenidone is not reacting well with her at all.  She has intermittent loss of taste at least twice a week.  She has loss of appetite and she has forces herself to eat.  She also has fatigue.  She says that if I strongly recommended then she will continue to soldier on with it.  Otherwise she feels less short of breath.  Pulmonary function test reviewed and is actually stable/slightly improved.  She says the cough is extremely well controlled except today after the pulmonary function test she was coughing quite a bit.  She is looking forward to going back to swimming.  She has had a Covid vaccine in booster and also status post EVUSHELD monoclonal antibody prophylaxis.  We discussed the possibility about possibility of enrolling in inhaled nitric oxide device/administration study.  This is to see if people can improve in the shortness of breath scale and also physical activity.  She took the  consent form and is deliberating.  She thinks she might find the study and inconvenience to her lifestyle where she is wanting to be more active.  We went over several details of this research protocol  She has a new symptom of like itching sensation deep in the right flank.  She feels it is not the skin it is from deep inside.  She is wondering if it could be related to pirfenidone.  Started after going on pirfenidone.  Explained to her that I am not seeing this in other patients with pirfenidone but will have to be open minded.  She does not apply Vaseline to her skin in the winter.  We discussed the possibility this could be dry skin related but she does not have other features of dry skin.  Nevertheless she is willing to try Vaseline.   Results for CHAPP   OV 07/08/2021  Subjective:  Patient ID: Rhonda [name], female , DOB: August 03, 1951 , age 32 y.o. , MRN: [id number] , ADDRESS: [address] [place] [address] PCP Dois Davenport, MD Patient Care Team: Dois Davenport, MD as PCP - General (Family Medicine) Rollene Rotunda, MD as PCP - Cardiology (Cardiology) Kalman Shan, MD as Consulting Physician (Pulmonary Disease) Daleen Squibb, Jesse Sans, MD (Inactive) (Cardiology) Linna Darner, RD as Dietitian (Family Medicine) Rollene Rotunda, MD as Consulting Physician (Cardiology)  This Provider for this visit: Treatment Team:  Attending Provider: Kalman Shan, MD    07/08/2021 -  ACUTE VISIT Chief Complaint  Patient presents with   Acute Visit    Pt states that she did start taking zpak yesterday 8/15 and said her cough is doing some better after being on second day of abx and said that she started prednisone today 8/16. States that she still has increased SOB. Pt did take a covid test which came back negative.   Follow-up chronic hypersensitive pneumonitis on prednisone 5 mg/day and also pirfenidone submaximal dose of 2 pills 3 times daily since October 2021.  On ILD-pro registry  protocol - failed ofev   - failed esbrit due to side effects Larrie Kass 2022  She is also on Breo   HPI Rhonda [name] 72 y.o. -this is an acute visit.  Last seen in February 2022 after that she was supposed to see me back in 3 months.  But this visit has been scheduled acutely.  She tells me that early in May 2022 she went to [facility]  They rented a house.  She believes that the bedroom had some mold.  She did not visually confirmed the mold but husband might have.  She says shortly after going there and spending a week she started having worsening cough with hoarseness of voice.  This cough is persisted all along and the  hoarseness has persisted as well.  The in June 2022 she had a short course of steroids that did not help.  She believes the dosing was not strong enough.  In July 2022 she had some back issues and was given 30 mg of prednisone for approximately 10 days with a taper and this helped her back.  But all along the cough is persisted and severe hoarseness is persisted.  Then approximately a week ago she started getting shortness of breath along with yellow sputum.  The cough got worse .  She states even at baseline she brings her a lot of white sputum and has to cough a lot.  [place] and a couple of days into the illness.  At this point in time she started noticing she was more easily desaturating.  She is not able to swim.  She says walking to the car her pulse ox dropped to 84%.  Then the yellow phlegm started getting worse.  She called in yesterday and we prescribed Z-Pak and a prednisone burst.  She says now with the Z-Pak the sputum color is improving.  A week ago she also saw Dr. Doran Heater for chronic cough.  100 mg 3 times daily of gabapentin has been prescribed.  She is slowly increasing it.  She is worried about the side effects of this drug.  Apparently her vocal cord had ulcers from repeated coughing.  ILD symptom score shows worsening.  In  the office she is very hoarse   Of note she stopped her pirfenidone in February/March 2022 because of side effects - gerd  She uses oxygen with exertion.  There is no leg swelling or hemoptysis.  Started z pak yesterday Started 12d pred taper yesterday     PFT   OV 08/14/2021  Subjective:  Patient ID: Rhonda [name], female , DOB: November 12, 1951 , age 31 y.o. , MRN: [id number] , ADDRESS: [address] [place] [address] PCP Dois Davenport, MD Patient Care Team: Dois Davenport, MD as PCP - General (Family Medicine) Rollene Rotunda, MD as PCP - Cardiology (Cardiology) Kalman Shan, MD as Consulting Physician (Pulmonary Disease) Daleen Squibb, Jesse Sans, MD (Inactive) (Cardiology) Linna Darner, RD as Dietitian (Family Medicine) Rollene Rotunda, MD as Consulting Physician (Cardiology)  This Provider for this visit: Treatment Team:  Attending Provider: Kalman Shan, MD  Follow-up chronic hypersensitive pneumonitis on prednisone 5 mg/day and also pirfenidone submaximal dose of 2 pills 3 times daily since October 2021.  On ILD-pro registry protocol - failed ofev   - failed esbrit due to side effects Larrie Kass 2022  She is also on Breo  08/14/2021 -   Chief Complaint  Patient presents with   Follow-up    Pt states she is feeling better since last visit. States she did receive her lighterweight POC which has been working well for her.     HPI Rhonda [name] 72 y.o. -seen last month with worsening symptoms acute bronchitis/flare.  Given Z-Pak and prednisone.  She is significantly better.  She tells me that even though she is better and his subjective symptom score is better below.  She still feels not back to her May 2022 baseline.  She says in the neighborhood she has to use more oxygen.  She is interested in a backpack for oxygen.  She is not able to swim as well as she used to.  She says acid reflux is under better control now  after seeing Dr. Doran Heater in ENT.  She is  wondering about starting pirfenidone she definitely does not want to do nintedanib again.  But even with pirfenidone she is reluctant.  We discussed CellCept because of progression but because of the immunosuppression she is reluctant.  We resolved that she would increase the prednisone to 10 mg/day and reassess.  She was supposed to have done a spirometry today but it did not happen.  She will have it done again in 4 weeks.  Have requested a schedule for that.    The main concern right now even though is better is that she is not to her baseline as of a year ago and she feels like she is having slowly progressive disease interstitial lung disease/chronic HP.       OV 10/03/2021  Subjective:  Patient ID: Rhonda [name], female , DOB: [birth year] , age 41 y.o. , MRN: [id number] , ADDRESS: [address] [place] [address] PCP Dois Davenport, MD Patient Care Team: Dois Davenport, MD as PCP - General (Family Medicine) Rollene Rotunda, MD as PCP - Cardiology (Cardiology) Kalman Shan, MD as Consulting Physician (Pulmonary Disease) Daleen Squibb, Jesse Sans, MD (Inactive) (Cardiology) Linna Darner, RD as Dietitian (Family Medicine) Rollene Rotunda, MD as Consulting Physician (Cardiology)  This Provider for this visit: Treatment Team:  Attending Provider: Kalman Shan, MD    10/03/2021 -   Chief Complaint  Patient presents with   Follow-up    Pt states her breathing has become worse since last visit. States she is having to use her O2 almost all the time now with exertion.    Follow-up chronic hypersensitive pneumonitis on prednisone 5 mg/day and also pirfenidone submaximal dose of 2 pills 3 times daily since October 2021.  On ILD-pro registry protocol - failed ofev due to side efect  - failed esbrit due to sde effects marvh 2022  -Last echo 2018  -Last high-res CT March 2021. -> Nov 2022 stable between 2  She is also on Breo  HPI Rhonda [name] 72 y.o. -returns  for follow-up.  In this visit she is categorically saying that she is significantly worse.  This point in particular relationship to dyspnea on exertion.  Her weight itself is stable.  She is currently on 5 mg prednisone per day [last time I thought I increased it to 10 mg/day].  Nevertheless she says other interim changes is that she has had slow weight gain.  BMI is 30.  She started semaglutide for hemoglobin A 1.C 6.5.  Is also to help weight.  She was also after seeing Dr. Doran Heater for her cough and significant amount of anti-PPI therapy.  She said this made her diarrhea worse.  She then reduced it and currently is taking omeprazole 20 mg/day in the daytime and 40 mg at night and in between Pepcid as well.  She stopped the gabapentin.  She feels after reducing the PPI the diarrhea is improved.  The cough is not as bad as it was even after stopping gabapentin but she continues to be fatigued.  She complains of bloating.  She feels her stomach is full.  She feels acid reflux is worse.  Acid reflux is worse after reducing the dose of H2 blockade.  She also has early satiety.  She also states when she coughs she vomits.  She feels she needs to see Dr. Rayetta Pigg.  I have messaged him and he hasagreed for him or his PA to see  her soon.  Nevertheless symptoms are significantly worse as seen on the symptom score below.  She is also reporting increased tachycardia with exertion and apparently Dr. Antoine Poche adjusted her beta-blocker.  She has upcoming appointment with them.    She says that at times when she desaturates easily at home even for minimal exertion.  We walked her today with a forehead probe and she actually did much better than expected going down to 89% only at the end of 3 labs [this is actually an improvement from the recent past versus stability].  She might have an erratic finger pulse ox probe at home.   Her pulmonary function shows a significant decline in DLCO.  Last echocardiogram 2018.   Last high-resolution CT chest March 2021.  Never had right heart catheterization.  Hemoglobin normal 14.7 g% in August 2022.           OV 10/21/2021  Subjective:  Patient ID: Rhonda [name], female , DOB: [birth year] , age 67 y.o. , MRN: [id number] , ADDRESS: [address] [place] [address] PCP Dois Davenport, MD Patient Care Team: Dois Davenport, MD as PCP - General (Family Medicine) Rollene Rotunda, MD as PCP - Cardiology (Cardiology) Kalman Shan, MD as Consulting Physician (Pulmonary Disease) Daleen Squibb, Jesse Sans, MD (Inactive) (Cardiology) Linna Darner, RD as Dietitian (Family Medicine) Rollene Rotunda, MD as Consulting Physician (Cardiology)  This Provider for this visit: Treatment Team:  Attending Provider: Kalman Shan, MD  Type of visit: Video Circumstance: COVID-19 national emergency Identification of [name] with 12/09/1950 and MRN [id number] - 2 person identifier Risks: Risks, benefits, limitations of telephone visit explained. Patient understood and verbalized agreement to proceed Anyone else on call: no Patient location:  her home This provider location: [address]; [place]; [place] [address]. [facility]. [phone]    10/21/2021 -     Follow-up chronic hypersensitive pneumonitis on prednisone 5 mg/day and also pirfenidone submaximal dose of 2 pills 3 times daily since October 2021.  On ILD-pro registry protocol - failed ofev due to side efect  - failed esbrit due to sde effects marvh 2022  -Last echo 2018  -Last high-res CT March 2021.  She is also on Breo HPI Rhonda [name] 72 y.o. -returns for follow-up via video visit to discuss test results.  She says currently she is just doing PPI lower dose 20 mg omeprazole in the daytime and famotidine at night.  With this her diarrhea is better.  Also bloating is better.  Correlating with this for she feels her lungs are stable.  She does  say that even now when she bends down she can desaturate.  She is not using oxygen at night.  Her primary care is testing her over no at night to see if she really needs it at night.  Nevertheless she feels somewhat better.  She had echocardiogram because of concern of pulmonary hypertension and there is no evidence of pulmonary hypertension.  She had high-resolution CT chest which the radiologist was described as minimally progressive since 18 months earlier in March 2021.  She had pulmonary function test that shows the FVC to be stable in the last few to several months but the DLCO to show slight decline.  Nevertheless the pattern at least compared to 2 years ago is 1 of progressive ILD on the pulmonary function test.  Certainly the hypoxia with easy exertion compared to a few years ago or a year  ago is also a sign that her ILD is getting worse.  She is likely stable or stabilized in the last few to several months.  We discussed going challenge again with nintedanib or pirfenidone but she is not interested.  We discussed doing CellCept and discussed the side effect profile of this.  She is not interested.  We discussed transplant referral for evaluation now that she is progressing so she can get plugged in and have a good conversation.  She is reflected on this from the last visit and she is not interested  We discussed the acid reflux controlled she feels she is at a good place right now with good acid reflux controlled with PPI in the morning and H2 blockade at night.  She has seen GI and at this point I believe they are not going to do pH probe.  I reviewed the note.  She continues on Breo and prednisone 5 mg/day which she plans to continue.  She has heard about the promedior trial with IV Pnetraxin for IPF.  This might be a study for non-- IPF progressive phenotype.  She prefers to do the study because it bypasses the GI route and it is an IV study.  We do not know if the study is going to happen.  We  do not know if you are going to be a site.  If these things all get aligned then maybe we could consider that.        OV 07/21/2022  Subjective:  Patient ID: Rhonda [name], female , DOB: 1951-10-09 , age 37 y.o. , MRN: [id number] , ADDRESS: [address] [place] [address] PCP Dois Davenport, MD Patient Care Team: Dois Davenport, MD as PCP - General (Family Medicine) Rollene Rotunda, MD as PCP - Cardiology (Cardiology) Kalman Shan, MD as Consulting Physician (Pulmonary Disease) Daleen Squibb, Jesse Sans, MD (Inactive) (Cardiology) Linna Darner, RD as Dietitian (Family Medicine) Rollene Rotunda, MD as Consulting Physician (Cardiology)  This Provider for this visit: Treatment Team:  Attending Provider: Kalman Shan, MD  She is also on [facility]  07/21/2022 -   Chief Complaint  Patient presents with   Follow-up    PFT performed today.  Pt states she has been doing good since last visit.     HPI Rhonda [name] 72 y.o. -returns for follow-up.  Overall in the summer months she is doing well.  She went to the [address] of [place] in [place].  In the areas where there was a lot of [place] and clean air she was able to walk down the beach 200 steps and including climb up with the oxygen without desaturation and feeling good.  But when she got to [facility] where there was pollution she started having cough.  She currently feels really good.  She feels well symptom scores are improved.  Walking desaturation test is also improved.  She attributes this to some weight loss and overall healthy living.  She is on Ozempic that is helping her lose weight.  She is swimming.  She does have some cough recently for the last week because of allergies.  Her dog Nicholes Calamity passed away.  Therefore she is taking care of the other dog and this is increasing her allergies and cough but still overall symptoms are improved.  She continues on prednisone 5 mg/day.  Currently she not  interested in any prednisone burst.  Social: She is expecting to be a grandmother in a few  months.     OV 10/27/2022  Subjective:  Patient ID: Rhonda [name], female , DOB: 19-May-1951 , age 71 y.o. , MRN: [id number] , ADDRESS: [address] [place] [address] PCP Dois Davenport, MD Patient Care Team: Dois Davenport, MD as PCP - General (Family Medicine) Rollene Rotunda, MD as PCP - Cardiology (Cardiology) Kalman Shan, MD as Consulting Physician (Pulmonary Disease) Daleen Squibb, Jesse Sans, MD (Inactive) (Cardiology) Linna Darner, RD as Dietitian (Family Medicine) Rollene Rotunda, MD as Consulting Physician (Cardiology)  This Provider for this visit: Treatment Team:  Attending Provider: Kalman Shan, MD    10/27/2022 -   Chief Complaint  Patient presents with   Acute Visit    Pt has been having complaints of increased SOB and feels like she is having a flare up. Pt states she took zpak and prednisone about a month ago. States she has had some desats in oxygen levels.     HPI JUDENE LOGUE 72 y.o. -presents for follow-up.  This is an acute visit.  She is now a grandmother.  The granddaughter's name is Laurie Panda.  She is pretty excited about that.  However she is having deterioration in her symptoms.  She says when she saw me in August 2023 she was actually quite well but she thought looking back she might of started with a cough.  She did travel west but then starting September 2023 she started having worsening cough which typically happens in the fall with the leaves get dry.  I prescribed 8-10-day prednisone burst along with Z-Pak.  She states the prednisone always helps her.  However she feels that the prednisone I prescribed was not probably strong enough.  Since then she is having a deterioration in the cough.  She also feels more fatigued.  There is white sputum coming with this.  She is also having worsening shortness of breath for the last 2 or 3 weeks.  She  feels this time the shortness of breath is different and is the more dominant symptoms.  Usually cough is the more dominant symptoms.  The deterioration in shortness of breath really suddenly in the last few weeks.  There are some associated exertional chest pain.  She did see Dr. Antoine Poche recently.  I reviewed his note.  She has had a previous normal stress test so he felt reassured this was not cardiac.  He did contemplate stopping the metoprolol given her "reactive airway disease" [she has hypersensitivity pneumonitis and can occasionally wheeze but I did leave her on metoprolol being a better once because of the beneficial effects on her heart rate with exertion].  Her symptom score showed deterioration  Her walking desaturation test also shows significant deterioration.  Similar to September 2022 but a lot worse.  This the worst its ever been.  There is no fever     OV 12/10/2022  Subjective:  Patient ID: Rhonda [name], female , DOB: 04-06-1951 , age 18 y.o. , MRN: [id number] , ADDRESS: [address] [place] [address] PCP Dois Davenport, MD Patient Care Team: Dois Davenport, MD as PCP - General (Family Medicine) Rollene Rotunda, MD as PCP - Cardiology (Cardiology) Kalman Shan, MD as Consulting Physician (Pulmonary Disease) Daleen Squibb, Jesse Sans, MD (Inactive) (Cardiology) Linna Darner, RD as Dietitian (Family Medicine) Rollene Rotunda, MD as Consulting Physician (Cardiology)  This Provider for this visit: Treatment Team:  Attending Provider: Kalman Shan, MD      12/10/2022 -   Chief Complaint  Patient presents with   Follow-up    Pft results from yesterday. Pt states that 10 mg of prednisone is helping.      HPI YELINA SARRATT 72 y.o. -here for follow-up.  After the last visit she called saying 5 mg of prednisone was not helping her.  She told me this time that at 50 mg of prednisone she feels great but then as she tapers and she gets into 10 mg  prednisone she starts getting symptoms and at 5 mg symptoms are significantly worse.  She then called and she is back up to 10 mg and she feels better with just very mild symptoms.  In fact walking desaturation test is more stable her exam is more stable.  She feels she is got hypersensitive airways.  I did indicate to her that it is possible there is allergic or eosinophilic inflammatory component.  However her blood eosinophils have always been normal.  But then it is Mast by chronic prednisone.  Currently she is on 10 mg of prednisone.  We took a shared decision for her to stay on 10 mg prednisone acknowledging the risk because this seems to be the best balance between symptom relief and quality of life.  Even exercise hypoxemia is better.  She did notice that recently in the last few months perfume particularly bothering her.  But otherwise she is stable.  She plans to revisit with her allergist Dr. Eileen Stanford   Current pulmonary function test was reviewed and is stable.      01/20/2023 Acute OV : ILD -chronic hypersensitivity pneumonitis and chronic respiratory failure Patient complains over the last 6 months that she has been having waxing and waning of symptoms with increased cough shortness of breath and decreased activity tolerance.  She has been treated with 3 steroid boost.  She says each time she has gotten much better and actually felt the best that she has felt in a long time.  But within a couple weeks of returning to prednisone 5mg  daily her symptoms started to return.  Most recently started noticing that her breathing was not doing as good 3 weeks ago.  Started to have more cough, congestion thick mucus decreased activity tolerance and increased oxygen demands.  Today in the office walk test shows patient is unable to maintain O2 saturations on pulsed oxygen.  Requires continuous flow oxygen. Patient was started on a prednisone burst yesterday at 40 mg to be tapered by 10 mg every 4 days.   She does feel like she is some better since starting on the higher dose of prednisone yesterday.  Chest x-ray today shows chronic ILD changes.  No acute process. She denies any fever, chest, orthopnea, edema, calf pain.  OV 02/11/2023  Subjective:  Patient ID: Rhonda [name], female , DOB: 1951/08/18 , age 43 y.o. , MRN: [id number] , AD[address]ESS: [address] [place] [address] PCP Dois Davenport, MD Patient Care Team: Dois Davenport, MD as PCP - General (Family Medicine) Rollene Rotunda, MD as PCP - Cardiology (Cardiology) Kalman Shan, MD as Consulting Physician (Pulmonary Disease) Daleen Squibb, Jesse Sans, MD (Inactive) (Cardiology) Linna Darner, RD as Dietitian (Family Medicine) Rollene Rotunda, MD as Consulting Physician (Cardiology)  This Provider for this visit: Treatment Team:  Attending Provider: Kalman Shan, MD    02/11/2023 -   Chief Complaint  Patient presents with   Follow-up    Increase mucous, cough and oxygen drops when she gets down to 10 mg of  prednisone symptoms start to come back.  She remains on 10 mg and states it is not holding her.     HPI KYONNA FRIER 72 y.o. -returns for follow-up.  She tells me that compared to a year's this year she is requiring more prednisone.  Even when I saw her last time she felt she needed a baseline of 10 mg prednisone.  We did that.  For years she been on 5 mg prednisone.  She started allergy shots recently Imuran does not helping her.  She is needed quite a few prednisone burst and every time she goes to below 20 mg she feels she is desaturating easily.  In the past it used to be that she could maintain at 5 mg/day.  Then late last year she needed 10 mg low-dose of prednisone per day.  Now she feels even  prednisone 10 mg is not "holding it".  She says when she was on 40 mg prednisone she would climb stairs on room air and the pulse ox would stay at 95% and above.  Now she is on 10 mg/day of prednisone and she is  requiring 5 L of portable oxygen to climb up 1 flight of stairs without desaturations.  She is very worried about her disease.  We did get a high-resolution CT chest and there is no no progression but clinically she is not behaving like that.  Last echocardiogram November 2022 and there was no elevated pulmonary artery pressures.  She is willing to get another echocardiogram.     We discussed the fact she is guarding progressive ILD despite what the CT says.  She has got active inflammation.  I indicated to her that we will commit to an intermediate course of prolonged prednisone at higher dose.  She is willing to do that and accept the risk.  Also indicated to her that we would need to add another steroid sparing and other immunomodulatory agent and would need to check safety labs.  We discussed there is no data on Rituxan for this particular issue of chronic HP progressive pulmonary fibrosis but there is data on CellCept.  She does not like the idea that CellCept can cause diarrhea.  We went over the immunosuppressive and long-term cancer risk.  Did indicate to her that benefit outweighs the risk at this point.  She is agreeable to safety labs and be open to CellCept.  She is committing to CellCept.  But there is a possibility a month from now if she is doing well on the higher dose prednisone and handling it well she might feel differently about CellCept.  We again discussed about lung transplant as an option and she is not interested.   ACUTE OV NAte MEier 03/18/23  ory of chronic HP (has had VATS bx 2013 supportive of dx) f/b MR and seen by [facility] transplant program.   Last seen in clinic 02/11/23. At that point on prednisone 5 mg daily, pirfenidone. Had planned to start cellcept at that visit. Ended up shifting gears and then had been planned for RHC for evaluation for candidacy for tyvaso today. 3 weeks ago started a prednisone taper from 40 mg daily x1 week, currently on 20 mg daily for the rest  of this week.   Never did start z pack. She actually kinda wonders if she has sinus infection.   She does not take bactrim due to history of headaches with sulfa abx.   Weight is stable relative to prior visits  Otherwise pertinent review of systems is negative.     OV 04/13/2023  Subjective:  Patient ID: Rhonda [name], female , DOB: [birth year] , age 89 y.o. , MRN: [id number] , ADDRESS: [address] [place] [id number] PCP Dois Davenport, MD Patient Care Team: Dois Davenport, MD as PCP - General (Family Medicine) Rollene Rotunda, MD as PCP - Cardiology (Cardiology) Kalman Shan, MD as Consulting Physician (Pulmonary Disease) Daleen Squibb, Jesse Sans, MD (Inactive) (Cardiology) Linna Darner, RD as Dietitian (Family Medicine) Rollene Rotunda, MD as Consulting Physician (Cardiology)  This Provider for this visit: Treatment Team:  Attending Provider: Kalman Shan, MD    04/13/2023 -   Chief Complaint  Patient presents with   Follow-up    F/up   .   HPI AKELA POCIUS 72 y.o. -returns for follow-up.  Last seen approximately 2 months ago.  At that time she was tell me she needs a higher dose of prednisone.  Consider CellCept and also right heart catheterization.  After much reflection she declined CellCept and decided to go with right heart catheterization to the research protocol.  Therefore she had to have a second echocardiogram that showed mild elevation of pulmonary artery pressure.  She was going to have a right heart catheterization but then she got ill saw one of my colleagues urgently.  She needed to go back up on the prednisone.  She reports that every time she tapers the prednisone she gets ill.  Therefore Dr. Thora Lance a month ago did a more extended slower taper of prednisone.  Currently she is on 20 mg/day since Apr 10, 2023 and so far her lung function is holding.  In fact her symptoms are better.  Her exercise hypoxemia test is also better and she feels  from a lung perspective a lot better.  She is clearly prednisone responsive.  However the high-dose of prednisone is causing significant changes with her metabolism particular with diabetes.  She is describing vision changes, hyperglycemia increased thirst, fatigue hemoglobin A1c is shot up to 7.5.  Primary care physician Dr. Nadyne Coombes has started on Jardiance and back on Ozempic.  On Apr 08, 2023 lab results review also shows increased hyperlipidemia.    Again had a conversation that she seems to have overall progressive phenotype with hypersensitive pneumonitis that is extremely prednisone sensitive.  Unclear why she is losing control and why we are needing higher dose of prednisone.  I recommended steroid sparing CellCept again.  However she is reluctant particular with the side effect profile.  We went over the cancer risk and opportunistic infection risk.  Side effect of diarrhea.  She wants to hold off.  She prefers to have a right heart catheterization and if that is pulmonary hypertension try inhaled treprostinil both of the pulmonary hypertension and potential theoretical risk of disease modulation.  The research study PHINDER (Dr Judeth Horn PI) can be restarted according to the research coordinator.  But she will have to have another echocardiogram first.  The billing issues when she first got into the study where the bills from the hospital were being sent to her.  She says this is now being resolved and she is no longer receiving those bills.  Did indicate to her that on no account she should be paying recent procedure bills although still should be going to insurance.       OV 06/08/2023  Subjective:  Patient ID: Rhonda [name], female , DOB: [birth year] , age 72 y.o. ,  MRN: [id number] , ADDRESS: [address] [place] [id number] PCP Dois Davenport, MD Patient Care Team: Dois Davenport, MD as PCP - General (Family Medicine) Rollene Rotunda, MD as PCP - Cardiology  (Cardiology) Kalman Shan, MD as Consulting Physician (Pulmonary Disease) Daleen Squibb, Jesse Sans, MD (Inactive) (Cardiology) Linna Darner, RD as Dietitian (Family Medicine) Rollene Rotunda, MD as Consulting Physician (Cardiology)  This Provider for this visit: Treatment Team:  Attending Provider: Kalman Shan, MD    06/08/2023 -   Chief Complaint  Patient presents with   Follow-up    Pft f/u, pt would like to go down on prednisone. Dealing with cough that is worse then baseline, with thick white mucus production.    Follow-up chronic hypersensitive pneumonitis ; slow progressive phenotype -  On ILD-pro registry protocol - On chronic prednisone  - 5mg  per day but stince spring 2024 needing more brsts and settled on 20mg  per day - failed ofev due to side efect  - failed esbriet due to sde effects  March 2022   -Last echo 2018  - declind lung tx referral 2023   - declined cellcept May 2024   - Last high-res CT Nov 222 -> mrach 2024: no change  Lst ECHO   -March 2024 and April 2024: Mild elevation of pulmonary artery pressure  -Right heart cath pending as of July 2024.  HPI CHAMAINE STANKUS 72 y.o. -returns for follow-up.  She had pulmonary function test and this appears stable.  She tells me that with the higher dose prednisone she has been doing well and her cough and effort tolerance is really good . However, having sifngianct hyperglycemia.  She is going to meet with primary care physician.  This because she is not able to tolerate JArdiance due to GI side effects. Without Jardiance she feels good but worried about her hyperglycemia. So she wants to drop prednisone to 15mg  per day. I am supportive of this.   Other issue though PFT stable she reports 2 weeks of new coughing that feels different from when her HP cough when she lowers prednisone.  She brings out white sputum after coughing much.  She feels the pain in the throat for the last 1 week.  Her daughter and Asa Lente and  son-in-law all have COVID.  They have not taken Paxlovid.  She believes she might have been exposed but she is tested for COVID multiple times.  She was in contact with me about this and the COVID screen negative.  On oral exam I thought there was some thrush.   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 I have messaged Dr Shirlee Latch to set up RHC through [facility]. She went out of window for the phinder study       SYMPTOM SCALE - ILD Sept 2020 01/30/2020  08/29/2020  01/14/2021 146# 07/08/2021 147# - off esbriet. Sic past week 08/14/2021 149# - pred only. No antifibrotic 10/03/2021 148# - pred 5mg  07/21/2022 137# 10/27/2022  04/13/2023 20mg  pered - 134# 06/08/2023 135# , pred 20mg   O2 use  o2 with ex o2 withe ex 2L     2LL o2 with ex 2L with ex 2 laps on RA    Shortness of Breath  0 -> 5 scale with 5 being worst (score 6 If unable to do)           At rest 0 0 1 0 1 0 1 0 1 1  0  Simple tasks - showers, clothes change, eating,  shaving 01 1 1 1.5 2 1 2 1 2 1 1   Household (dishes, doing bed, laundry) 0 3 3 3 4 2  3.5 2 3 2 2   Shopping 1 1 1 2 2 1 1 1 2 1 2   Walking level at own pace 1 2 2 1 2 2 3 1 2 2 1   Walking up Stairs 2 3 3 3 4 3 4 3 4 3 4   Total (30-36) Dyspnea Score 5 10 11  10.5 15 9  14.5 8 14 10 10   How bad is your cough? 1 3 2 2 2 2 2 2 3  0 2  How bad is your fatigue 1 2 2 3 2  0 3 1 2 2 4   How bad is nausea  0 0 3 0 0 1 0 0 0 0   How bad is vomiting?   0 0 0 0 0 1 0 0 0 0   How bad is diarrhea?  00 1 0 0 0 1 0 0 0 1   How bad is anxiety?  0 0 0 00 0 2 0 00 0 0   How bad is depression  0 2 1 1 0 1 0 0 0 0   00 0 0 Simple office walk 185 feet x  3 laps goal with forehead probe 04/13/2018  08/16/2018  12/15/2018  05/09/2019  08/22/2019  01/30/2020  08/29/2020  01/14/2021  07/08/2021  08/14/2021  10/03/2021  07/21/2022  10/27/2022  04/13/2023  06/08/2023   O2 used Room air Room air Room air Room air Room air Room air ra ra ra ra ra ra ra ra Ra sit stand  Number laps completed 3 3 3 3 2   stopped at 2 die to hip pain 3 laps - no hip pain following hip surgery 3 3 3  attempted byt did  only 2 3 bu stopped at 2 3 and did all 3 3 Ddi onl 2 las Sit/stand x 10 times X 10  Comments about pace good Moderate pace Normal, hip bothering     avg pace avg  avg avg avg Good pace   Resting Pulse Ox/HR 98% and 73/min 98% and HR 77/min 99% and HR 61/min 98% and HR 70/min 98% 98% and 75/min 97% and 67/mi 94% and 80/min 96% and HR 72 98% and 71 100% and HR 82 98% ad HR 72 100% and HR 79 97% and heart rate 74 93% and HR 83  Final Pulse Ox/HR 91% and 91/min 93% and 92.min 94% and 92/min 93% and 98/min 91%  89% and 93/,imn 88% and 94 89% ad 88.nin 88% at 2nd  lap end HR 92 86% and 96 89% and HR 91 93% and HR 87 84% and HR 104 94% and heart rate 92 87% and HR 90  Desaturated </= 88% no no no no   yes almost yes yes almst no yes no yes  Desaturated <= 3% points yes yes Yes, 5 points Yes, 5 points    Yes, 5 points Yes, 8 points Yes, 12 pints Yes 11 pot 5 pots 16 pont Yes, 3 poi yes  Got Tachycardic >/= 90/min yes yes yes yes     yes yes yes no     Symptoms at end of test none none Hip pain and very mild dyspnea  Stopped due to hip pain Moderate duyspnea with mask Mild to moderate dyspnea  Severe dyspnea Mild dyspnea Severe dyspnea Very mild dyspneax moderate No dyspnea Mild dyspnea  Miscellaneous comments x x  No hip pain    Similar to lastime Symptoms out of proportion ? better worse     LAB RESULTS last 96 hours No results found.  LAB RESULTS last 90 days Recent Results (from the past 2160 hour(s))  Sed Rate (ESR)     Status: None   Collection Time: 03/18/23  2:02 PM  Result Value Ref Range   Sed Rate 28 0 - 30 mm/hr  B Nat Peptide     Status: None   Collection Time: 03/18/23  2:02 PM  Result Value Ref Range   Pro B Natriuretic peptide (BNP) 71.0 0.0 - 100.0 pg/mL  Hepatic function panel     Status: None   Collection Time: 03/18/23  2:02 PM  Result Value Ref Range   Total Bilirubin 0.3 0.2 - 1.2  mg/dL   Bilirubin, Direct 0.1 0.0 - 0.3 mg/dL   Alkaline Phosphatase 57 39 - 117 U/L   AST 19 0 - 37 U/L   ALT 22 0 - 35 U/L   Total Protein 7.0 6.0 - 8.3 g/dL   Albumin 3.7 3.5 - 5.2 g/dL  Lactic acid, plasma     Status: Abnormal   Collection Time: 03/18/23  2:02 PM  Result Value Ref Range   LACTIC ACID 2.1 (H) 0.4 - 1.8 mmol/L  Phosphorus     Status: None   Collection Time: 03/18/23  2:02 PM  Result Value Ref Range   Phosphorus 3.4 2.3 - 4.6 mg/dL  Magnesium     Status: None   Collection Time: 03/18/23  2:02 PM  Result Value Ref Range   Magnesium 1.9 1.5 - 2.5 mg/dL  Basic Metabolic Panel (BMET)     Status: Abnormal   Collection Time: 03/18/23  2:02 PM  Result Value Ref Range   Sodium 136 135 - 145 mEq/L   Potassium 4.8 3.5 - 5.1 mEq/L   Chloride 101 96 - 112 mEq/L   CO2 27 19 - 32 mEq/L   Glucose, Bld 247 (H) 70 - 99 mg/dL   BUN 22 6 - 23 mg/dL   Creatinine, Ser 0.96 0.40 - 1.20 mg/dL   GFR 04.54 (L) >09.81 mL/min    Comment: Calculated using the CKD-EPI Creatinine Equation (2021)   Calcium 9.3 8.4 - 10.5 mg/dL  CBC w/Diff     Status: Abnormal   Collection Time: 03/18/23  2:02 PM  Result Value Ref Range   WBC 15.1 (H) 4.0 - 10.5 K/uL   RBC 5.28 (H) 3.87 - 5.11 Mil/uL   Hemoglobin 14.8 12.0 - 15.0 g/dL   HCT 19.1 47.8 - 29.5 %   MCV 86.8 78.0 - 100.0 fl   MCHC 32.4 30.0 - 36.0 g/dL   RDW 62.1 (H) 30.8 - 65.7 %   Platelets 228.0 150.0 - 400.0 K/uL   Neutrophils Relative % 87.9 Repeated and verified X2. (H) 43.0 - 77.0 %   Lymphocytes Relative 8.2 (L) 12.0 - 46.0 %   Monocytes Relative 3.7 3.0 - 12.0 %   Eosinophils Relative 0.1 0.0 - 5.0 %   Basophils Relative 0.1 0.0 - 3.0 %   Neutro Abs 13.2 (H) 1.4 - 7.7 K/uL   Lymphs Abs 1.2 0.7 - 4.0 K/uL   Monocytes Absolute 0.6 0.1 - 1.0 K/uL   Eosinophils Absolute 0.0 0.0 - 0.7 K/uL   Basophils Absolute 0.0 0.0 - 0.1 K/uL  COVID-19, Flu A+B and RSV     Status: None   Collection Time: 03/18/23  3:52 PM  Specimen: Nasal  Swab   Nasal Swab  Previously  Result Value Ref Range   SARS-CoV-2, NAA Not Detected Not Detected   Influenza A, NAA Not Detected Not Detected   Influenza B, NAA Not Detected Not Detected   RSV, NAA Not Detected Not Detected   Test Information: Comment     Comment: This nucleic acid amplification test was developed and its performance characteristics determined by [employer]. Nucleic acid amplification tests include RT-PCR and TMA. This test has not been FDA cleared or approved. This test has been authorized by FDA under an Emergency Use Authorization (EUA). This test is only authorized for the duration of time the declaration that circumstances exist justifying the authorization of the emergency use of in vitro diagnostic tests for detection of SARS-CoV-2 virus and/or diagnosis of COVID-19 infection under section 564(b)(1) of the Act, 21 U.S.C. 540JWJ-1(B) (1), unless the authorization is terminated or revoked sooner. When diagnostic testing is negative, the possibility of a false negative result should be considered in the context of a patient's recent exposures and the presence of clinical signs and symptoms consistent with COVID-19. An individual without symptoms of COVID-19 and who is not shedding SARS-CoV-2 virus wo uld expect to have a negative (not detected) result in this assay.   AFB Culture & Smear     Status: None   Collection Time: 03/19/23 10:02 AM   Specimen: Sputum   Sputum  Result Value Ref Range   AFB Specimen Processing Concentration    Acid Fast Smear Negative    Acid Fast Culture Negative     Comment: No acid fast bacilli isolated after 6 weeks.  Respiratory or Resp and Sputum Culture     Status: None   Collection Time: 03/22/23  8:51 AM   Specimen: Sputum  Result Value Ref Range   MICRO NUMBER: [id number]    SPECIMEN QUALITY: Inadequate    Source SPUTUM    STATUS: FINAL    GRAM STAIN:      The sputum specimen submitted contains 25 or more  squamous epithelial cells per low power field and is unacceptable for culture due to oropharyngeal contamination. Please resubmit another specimen if clinically indicated.   RESULT: CANCELED     Comment: Result canceled by the ancillary.  Pulmonary function test     Status: None (Preliminary result)   Collection Time: 06/04/23 10:12 AM  Result Value Ref Range   FVC-Pre 1.44 L   FVC-%Pred-Pre 60 %   FEV1-Pre 1.30 L   FEV1-%Pred-Pre 73 %   FEV6-Pre 1.44 L   FEV6-%Pred-Pre 63 %   Pre FEV1/FVC ratio 91 %   FEV1FVC-%Pred-Pre 120 %   Pre FEV6/FVC Ratio 100 %   FEV6FVC-%Pred-Pre 105 %   FEF 25-75 Pre 2.16 L/sec   FEF2575-%Pred-Pre 145 %   DLCO unc 10.82 ml/min/mmHg   DLCO unc % pred 64 %   DLCO cor 10.82 ml/min/mmHg   DLCO cor % pred 64 %   DL/VA 2.13 ml/min/mmHg/L   DL/VA % pred 85 %     PFT     Latest Ref Rng & Units 06/04/2023   10:12 AM 03/08/2023   10:49 AM 12/09/2022   11:48 AM 07/21/2022    9:50 AM 01/06/2022    9:51 AM 10/03/2021    8:59 AM 01/14/2021    1:51 PM  PFT Results  FVC-Pre L 1.44  P 1.45  1.53  1.54  1.58  1.48  1.49   FVC-Predicted Pre % 60  P 64  66  67  67  63  62   Pre FEV1/FVC % % 91  P 93  87  91  90  92  92   FEV1-Pre L 1.30  P 1.35  1.32  1.40  1.43  1.37  1.37   FEV1-Predicted Pre % 73  P 80  77  81  81  78  77   DLCO uncorrected ml/min/mmHg 10.82  P 8.11  9.64  8.16  8.62  8.92  11.26   DLCO UNC% % 64  P 50  59  50  53  55  69   DLCO corrected ml/min/mmHg 10.82  P 7.81  9.41  7.86  8.62  8.92  11.26   DLCO COR %Predicted % 64  P 48  58  48  53  55  69   DLVA Predicted % 85  P 65  81  64  75  78  92   TLC L  3.02        TLC % Predicted %  70        RV % Predicted %  70          P Preliminary result       has a past medical history of Allergy, Arthritis, Asthma, Cataract, Coronary artery calcification seen on CAT scan (08/19/2017), DDD (degenerative disc disease), thoracic, Depression, DOE (dyspnea on exertion), Fatty liver, GERD  (gastroesophageal reflux disease), H/O steroid therapy, Heart palpitations (02/28/2015), Helicobacter pylori ab+, Hemorrhoids, Hiatal hernia, High cholesterol, History of chronic bronchitis, History of migraine, History of MRSA infection, Hyperlipidemia, mixed (08/19/2017), Hyperplastic colon polyp (2007), IBS (irritable bowel syndrome), Inguinal hernia, Insulin resistance, Interstitial lung disease (HCC), MVP (mitral valve prolapse), Pneumonia, Pneumonitis, hypersensitivity (HCC), PONV (postoperative nausea and vomiting), Pre-diabetes, Pulmonary fibrosis (HCC), PVC (premature ventricular contraction) (08/19/2017), Rapid heart rate, Squamous cell carcinoma of skin, Tinnitus, right ear, and Vocal cord ulcer.   reports that she has never smoked. She has never used smokeless tobacco.  Past Surgical History:  Procedure Laterality Date   45 HOUR PH STUDY N/A 02/21/2018   Procedure: 24 HOUR PH STUDY;  Surgeon: Napoleon Form, MD;  Location: [facility];  Service: Endoscopy;  Laterality: N/A;   BREAST BIOPSY Right 2009   BIOPSY, pt denies   BREAST BIOPSY Left 2003   Benign    CATARACT EXTRACTION Left    CESAREAN SECTION     COLONOSCOPY     ESOPHAGEAL MANOMETRY N/A 02/21/2018   Procedure: ESOPHAGEAL MANOMETRY (EM);  Surgeon: Napoleon Form, MD;  Location: [facility];  Service: Endoscopy;  Laterality: N/A;   FOOT FRACTURE SURGERY  2006 or 2007   right   HYMENECTOMY     LUNG BIOPSY  09/28/2012   Procedure: LUNG BIOPSY;  Surgeon: Loreli Slot, MD;  Location: [facility] OR;  Service: Thoracic;  Laterality: N/A;  lung biopsies tims three   SQUAMOUS CELL CARCINOMA EXCISION Left    left arm   TOTAL HIP ARTHROPLASTY Right 09/10/2015   Procedure: RIGHT TOTAL HIP ARTHROPLASTY ANTERIOR APPROACH;  Surgeon: Durene Romans, MD;  Location: [facility];  Service: Orthopedics;  Laterality: Right;   TOTAL HIP ARTHROPLASTY Left 10/03/2019   Procedure: TOTAL HIP ARTHROPLASTY ANTERIOR APPROACH;  Surgeon: Durene Romans, MD;  Location: [facility];  Service: Orthopedics;  Laterality: Left;  70 mins   TUBAL LIGATION     UPPER GI ENDOSCOPY     VIDEO ASSISTED THORACOSCOPY  09/28/2012   Procedure: VIDEO  ASSISTED THORACOSCOPY;  Surgeon: Loreli Slot, MD;  Location: [facility] OR;  Service: Thoracic;  Laterality: Right;   VIDEO BRONCHOSCOPY  11/19/2011   Procedure: VIDEO BRONCHOSCOPY WITH FLUORO;  Surgeon: Kalman Shan, MD;  Location: [facility];  Service: Endoscopy;;    Allergies  Allergen Reactions   Atorvastatin Other (See Comments)    Leg pain   Betadine [Povidone Iodine] Other (See Comments)    blisters   Codeine Nausea And Vomiting and Other (See Comments)    Other reaction(s): Vomiting   Garlic Diarrhea   Hydrocodone Nausea And Vomiting and Other (See Comments)   Macrobid [Nitrofurantoin] Other (See Comments)   Ofev [Nintedanib] Other (See Comments)    Abdominal pain   Onion Diarrhea   Other Other (See Comments)   Rosuvastatin Other (See Comments)    Leg pain   Shellfish Allergy Nausea And Vomiting   Statins Other (See Comments)    "Leg pain", Other reaction(s): Other (See Comments), Other (See Comments), "Leg pain", Leg pain, Leg pain   Sulfa Antibiotics    Sulfonamide Derivatives Other (See Comments)    headaches    Immunization History  Administered Date(s) Administered   Fluad Quad(high Dose 65+) 07/17/2019   Hepatitis A 05/23/2010   Hepatitis B 06/23/2006   Influenza Split 08/11/2012, 08/14/2017   Influenza Whole 09/08/2011   Influenza, High Dose Seasonal PF 08/24/2016, 07/24/2017, 08/31/2018, 08/15/2020, 09/26/2020, 09/12/2021, 08/24/2022   Influenza,inj,Quad PF,6+ Mos 09/07/2013   Influenza-Unspecified 09/26/2014, 07/27/2015   MMR 05/23/2010   Moderna Sars-Covid-2 Vaccination 07/30/2021, 01/01/2022   PFIZER(Purple Top)SARS-COV-2 Vaccination 12/16/2019, 01/08/2020, 07/18/2020, 08/11/2022   Pneumococcal Conjugate-13 03/15/2014   Pneumococcal Polysaccharide-23 01/11/2018,  09/26/2020, 08/26/2021   Respiratory Syncytial Virus Vaccine,Recomb Aduvanted(Arexvy) 07/28/2022   Td 02/22/2003   Tdap 10/28/2012   Zoster Recombinant(Shingrix) 02/15/2017, 05/17/2017   Zoster, Live 12/04/2010    Family History  Problem Relation Age of Onset   Asthma Mother    Osteoarthritis Mother    Dementia Mother 43   Lymphoma Father    Diabetes Father    Hypertension Sister    Heart disease Sister    Kidney disease Sister    Allergic rhinitis Daughter    Ovarian cancer Maternal Aunt    Breast cancer Paternal Aunt    Breast cancer Paternal Aunt    Emphysema Maternal Grandmother    Asthma Maternal Grandmother    Lung disease Maternal Grandfather    Bone cancer Paternal Grandfather    Breast cancer Cousin    Breast cancer Cousin    Breast cancer Cousin    Colon cancer Neg Hx    Angioedema Neg Hx    Eczema Neg Hx    Immunodeficiency Neg Hx    Urticaria Neg Hx      Current Outpatient Medications:    acetaminophen (TYLENOL) 500 MG tablet, Tylenol Extra Strength, Disp: , Rfl:    albuterol (PROAIR HFA) 108 (90 Base) MCG/ACT inhaler, Inhale 2 puffs into the lungs every 4 (four) hours as needed for wheezing. Or coughing spells.  You may use 2 Puffs 5-10 minutes before exercise., Disp: 1 Inhaler, Rfl: 3   Alpha-D-Galactosidase (BEANO PO), Take 1 tablet by mouth as needed (if eating onion or garlic). , Disp: , Rfl:    Ascorbic Acid (VITAMIN C) 1000 MG tablet, Take 1,000 mg by mouth daily., Disp: , Rfl:    ASHWAGANDHA PO, Take 2 capsules by mouth daily., Disp: , Rfl:    azelastine (ASTELIN) 0.1 % nasal spray, Place 1 spray into both  nostrils at bedtime., Disp: , Rfl:    Bacillus Coagulans-Inulin (ALIGN PREBIOTIC-PROBIOTIC PO), Take 2 tablets by mouth daily at 6 (six) AM. chewable, Disp: , Rfl:    Calcium Carb-Cholecalciferol (CALCIUM 500 + D PO), Take 2 tablets by mouth daily at 6 (six) AM., Disp: , Rfl:    chlorpheniramine (CHLOR-TRIMETON) 4 MG tablet, Take 2 mg by mouth 2  (two) times daily. Takes half tablet twice daily, Disp: , Rfl:    cholecalciferol (VITAMIN D3) 25 MCG (1000 UNIT) tablet, Take 1,000 Units by mouth every other day., Disp: , Rfl:    ciclopirox (PENLAC) 8 % solution, See admin instructions., Disp: , Rfl:    dicyclomine (BENTYL) 10 MG capsule, Take 1 capsule by mouth daily as needed., Disp: , Rfl:    diltiazem (CARDIZEM CD) 120 MG 24 hr capsule, Take 1 capsule (120 mg total) by mouth daily., Disp: 90 capsule, Rfl: 3   diphenhydrAMINE-PE-APAP (DELSYM COUGH/COLD NIGHT TIME) 12.5-5-325 MG/10ML LIQD, Take by mouth as needed., Disp: , Rfl:    EPINEPHrine 0.3 mg/0.3 mL IJ SOAJ injection, Inject into the muscle., Disp: , Rfl:    ezetimibe (ZETIA) 10 MG tablet, Take 1 tablet (10 mg total) by mouth daily. Please keep upcoming appt for future refills. Thank you., Disp: 90 tablet, Rfl: 3   fluconazole (DIFLUCAN) 100 MG tablet, Take 1 tablet (100 mg total) by mouth daily for 7 days. Take 200mg  loading dose, followed by 100mg  for 7 days, Disp: 10 tablet, Rfl: 0   fluticasone (FLONASE) 50 MCG/ACT nasal spray, Spray 2 sprays into each nostril every day., Disp: 48 g, Rfl: 1   fluticasone furoate-vilanterol (BREO ELLIPTA) 100-25 MCG/ACT AEPB, Inhale 1 puff into the lungs daily., Disp: 180 each, Rfl: 3   lactase (LACTAID) 3000 units tablet, Take by mouth., Disp: , Rfl:    levocetirizine (XYZAL) 5 MG tablet, Take 5 mg by mouth at bedtime., Disp: , Rfl:    metoprolol succinate (TOPROL XL) 25 MG 24 hr tablet, Take 1 tablet (25 mg total) by mouth daily. (Patient taking differently: Take 12.5 mg by mouth daily.), Disp: 90 tablet, Rfl: 1   montelukast (SINGULAIR) 10 MG tablet, TAKE 1 TABLET BY MOUTH EVERYDAY AT BEDTIME, Disp: 90 tablet, Rfl: 2   Multiple Vitamin (MULTIVITAMIN) tablet, Take 1 tablet by mouth daily., Disp: , Rfl:    nystatin-triamcinolone (MYCOLOG II) cream, nystatin-triamcinolone 100,000 unit/g-0.1 % topical cream, Disp: , Rfl:    omeprazole (PRILOSEC OTC) 20  MG tablet, Take 20 mg by mouth daily., Disp: , Rfl:    OXYGEN, Inhale into the lungs. Up to 6 liters as needed during the day and nite time use on/off, Disp: , Rfl:    OZEMPIC, 0.25 OR 0.5 MG/DOSE, 2 MG/1.5ML SOPN, Inject into the skin., Disp: , Rfl:    OZEMPIC, 0.25 OR 0.5 MG/DOSE, 2 MG/3ML SOPN, Inject 0.5 mg into the skin once a week., Disp: , Rfl:    pravastatin (PRAVACHOL) 40 MG tablet, Take 1 tablet (40 mg total) by mouth every evening., Disp: 90 tablet, Rfl: 1   predniSONE (DELTASONE) 10 MG tablet, Take 1 tablet (10 mg total) by mouth daily with breakfast., Disp: 30 tablet, Rfl: 2   predniSONE (DELTASONE) 20 MG tablet, Take 1 tablet (20 mg total) by mouth daily with breakfast., Disp: 30 tablet, Rfl: 5   Sodium Chloride, Inhalant, 7 % NEBU, Use per nebulizer once a day to help clear sputum, Disp: 120 mL, Rfl: 3   nitroGLYCERIN (NITROSTAT) 0.4 MG SL  tablet, Place 1 tablet (0.4 mg total) under the tongue every 5 (five) minutes as needed for chest pain., Disp: 25 tablet, Rfl: 12  Current Facility-Administered Medications:    methylPREDNISolone acetate (DEPO-MEDROL) injection (RADIOLOGY ONLY) 120 mg, 120 mg, Intramuscular, Once, Omar Person, MD      Objective:   Vitals:   06/08/23 0931  BP: 116/62  Pulse: 81  Temp: 98.1 F (36.7 C)  TempSrc: Oral  SpO2: 92%  Weight: 135 lb 9.6 oz (61.5 kg)  Height: 5\' 1"  (1.549 m)    Estimated body mass index is 25.62 kg/m as calculated from the following:   Height as of this encounter: 5\' 1"  (1.549 m).   Weight as of this encounter: 135 lb 9.6 oz (61.5 kg).  @WEIGHTCHANGE @  [employer]   06/08/23 0931  Weight: 135 lb 9.6 oz (61.5 kg)     Physical Exam  k General: No distress. Looks well O2 at rest: no Cane present: no Sitting in wheel chair: no Frail: no Obese: no Neuro: Alert and Oriented x 3. GCS 15. Speech normal Psych: Pleasant Resp:  Barrel Chest - no.  Wheeze - no, Crackles - MILD In UL, No overt respiratory  distress CVS: Normal heart sounds. Murmurs - no Ext: Stigmata of Connective Tissue Disease - no HEENT: Normal upper airway. PEERL +. No post nasal drip. THRUSH appears present - some discrete white spots in hard aplate and oro-pharynx        Assessment:       ICD-10-CM   1. Hypersensitivity pneumonitis (HCC)  J67.9     2. ILD (interstitial lung disease) (HCC)  J84.9 Pulmonary function test    3. Current chronic use of systemic steroids  Z79.52     4. Chronic cough  R05.3     5. Sore throat  J02.9     6. Oral thrush  B37.0          Plan:     Patient Instructions     ICD-10-CM   1. Hypersensitivity pneumonitis (HCC)  J67.9     2. ILD (interstitial lung disease) (HCC)  J84.9     3. Current chronic use of systemic steroids  Z79.52     4. Chronic cough  R05.3     5. Sore throat  J02.9     6. Oral thrush  B37.0       Hypersensitivity pneumonitis (HCC) ILD (interstitial lung disease) (HCC) Current chronic use of systemic steroids  - ILD more stable with higher dose of steroids but higher dose of steroids causing high blood sugars and now also thrush  Plan - Okay to drop to 15 mg daily prednisone from 20 mg daily prednisone - Continue to use oxygen as needed for exertional hypoxemia -Follow-up with right heart catheterization standard of care to evaluate pulmonary hypertension   -Await call from Dr. Marca Ancona and team -Depending on course in the future CellCept/clinical trials are a care option  -Noted that you have been reluctant for CellCept because of side effect concerns -Reassess need for Bactrim prophylaxis at the future visit - Continue to control acid reflux well  Chronic cough Sore throat Oral thrush  -I believe you have oral thrush to explain the worsening cough sore throat 06/08/2023   Plan   fluconazole 200 mg loading dose, followed by 100 mg daily for 7days   Hyperglycemia  Plan  - per PCP   Followup - spirometry and dlco in 16  weeks  - 30  min visit in 16 weeks with DR Marchelle Gearing   FOLLOWUP Return in about 16 weeks (around 09/28/2023) for after Cleda Daub and DLCO, 30 min visit, with Dr Marchelle Gearing, Face to Face Visit.   ( Level 05 visit E&M 2024: Estb >= 40 min in  visit type: on-site physical face to visit  in total care time and counseling or/and coordination of care by this undersigned MD - Dr Kalman Shan. This includes one or more of the following on this same day 06/08/2023: pre-charting, chart review, note writing, documentation discussion of test results, diagnostic or treatment recommendations, prognosis, risks and benefits of management options, instructions, education, compliance or risk-factor reduction. It excludes time spent by the CMA or office staff in the care of the patient. Actual time 40 min)    SIGNATURE    Dr. Kalman Shan, M.D., F.C.C.P,  Pulmonary and Critical Care Medicine Staff Physician, [facility] Director - Interstitial Lung Disease  Program  Pulmonary Fibrosis [facility] at [facility] [place], [place], [address]  Pager: [phone], If no answer or between  15:00h - 7:00h: call [phone] Telephone: [phone]  6:36 PM 06/08/2023

## 2023-06-10 ENCOUNTER — Other Ambulatory Visit: Payer: Self-pay | Admitting: Cardiology

## 2023-06-18 ENCOUNTER — Encounter: Payer: Medicare PPO | Admitting: Internal Medicine

## 2023-07-08 ENCOUNTER — Other Ambulatory Visit: Payer: Self-pay | Admitting: Internal Medicine

## 2023-07-09 ENCOUNTER — Other Ambulatory Visit: Payer: Self-pay | Admitting: Internal Medicine

## 2023-07-09 LAB — OPHTHALMOLOGY REPORT-SCANNED

## 2023-07-23 ENCOUNTER — Telehealth: Payer: Self-pay

## 2023-07-23 ENCOUNTER — Encounter: Payer: Self-pay | Admitting: Nurse Practitioner

## 2023-07-23 ENCOUNTER — Ambulatory Visit: Payer: Medicare PPO | Admitting: Nurse Practitioner

## 2023-07-23 VITALS — BP 110/60 | HR 88 | Ht 59.0 in | Wt 136.2 lb

## 2023-07-23 DIAGNOSIS — K529 Noninfective gastroenteritis and colitis, unspecified: Secondary | ICD-10-CM

## 2023-07-23 DIAGNOSIS — K219 Gastro-esophageal reflux disease without esophagitis: Secondary | ICD-10-CM

## 2023-07-23 MED ORDER — FAMOTIDINE 20 MG PO TABS: 20.0000 mg | ORAL_TABLET | Freq: Every day | ORAL | 1 refills | Status: DC

## 2023-07-23 NOTE — Progress Notes (Signed)
07/23/2023 Rhonda [name] [id number] Jul 11, 1951   Chief Complaint: Schedule a colonoscopy   History of Present Illness: Rhonda [name] is a 72 year old female with a past medical history of depression, asthma, hypersensitivity pneumonitis/interstitial lung disease, coronary artery calcification, MVP, hyperlipidemia, MVP, migraine headaches, positive H. Pylori IgG antibody, GERD and a single hyperplastic polyp per colonoscopy 2007.   She is known by Dr. Rhea Belton. She presents today to discuss scheduling a colonoscopy.  She was previously scheduled for a colonoscopy in our outpatient endoscopy center, however, this procedure was canceled because she has a history of significant pulmonary disease and difficult intubation which requires any endoscopic procedure to be performed in the hospital setting. She endorses having nausea and acid reflux symptoms daily in the afternoon and after eating dinner for the past 2 months. She sometimes burps up bits of food. No dysphagia. She had significant nausea and vomiting 1 year ago when she was on higher doses of Ozempic was initially started 2 years ago for glucose control. Ozempic dose was reduced to 0.25 mg and her vomiting episodes abated. She takes Prilosec 20 mg one half tab daily.  She passes 7-8 nonbloody loose stools for few days then passes 1 or 2 normal formed brown stools for a week or 2 which has been her chronic bowel pattern. No rectal bleeding or black stools. She underwent food allergy testing in the past and stated that she is allergic to onions, garlic, leaks, shellfish and she has dairy intolerance. She has occasional RLQ pain described as brief and stitch like occurs once weekly and last for few minutes then goes away. No significant abdominal pain. She restarted Metformin 2 months ago after her glucose levels were out of control. She intermittently requires higher doses of Prednisone (50mg ) when she has worsening respiratory symptoms  secondary to upper sensitivity pneumonitis and interstitial lung disease followed closely by pulmonologist Dr. Marchelle Gearing. There is also potential concern that her acid reflux symptoms may be contributing to her progressive lung disease. She continues to cough up thick clear phlegm. No hemoptysis. She has intermittent shortness of breath. She was referred to cardiology for right heart catheterization to assess for pulmonary hypertension, this procedure has not yet been scheduled. ECHO 02/2023 showed LVEF 65 to 70%, normal RV function and mildly enlarged RV with mildly elevated pulmonary artery systolic pressure. She stated feeling quite well this week without significant coughing or SOB. She uses home oxygen as needed.  She underwent an EGD 04/18/2014 which showed a reducible hiatal hernia.  Colonoscopy 11/11/2006 showed a diminutive hyperplastic polyp and a normal colonoscopy in 2014.  No known family history of colon polyps or colorectal cancer.     Latest Ref Rng & Units 03/18/2023    2:02 PM 02/12/2023   11:58 AM 10/27/2022    4:24 PM  CBC  WBC 4.0 - 10.5 K/uL 15.1  15.8  12.5   Hemoglobin 12.0 - 15.0 g/dL 40.9  81.1  91.4   Hematocrit 36.0 - 46.0 % 45.8  45.2  43.8   Platelets 150.0 - 400.0 K/uL 228.0  244.0  261.0        Latest Ref Rng & Units 03/18/2023    2:02 PM 02/16/2023   11:00 AM 02/12/2023   11:58 AM  CMP  Glucose 70 - 99 mg/dL 782   956   BUN 6 - 23 mg/dL 22   17   Creatinine 2.13 - 1.20 mg/dL 0.86   5.78  Sodium 135 - 145 mEq/L 136   139   Potassium 3.5 - 5.1 mEq/L 4.8   3.8   Chloride 96 - 112 mEq/L 101   105   CO2 19 - 32 mEq/L 27   25   Calcium 8.4 - 10.5 mg/dL 9.3   9.4   Total Protein 6.0 - 8.3 g/dL 7.0  7.3    Total Bilirubin 0.2 - 1.2 mg/dL 0.3  0.4    Alkaline Phos 39 - 117 U/L 57  67    AST 0 - 37 U/L 19  20    ALT 0 - 35 U/L 22  18      ECHO 03/10/2023: IMPRESSIONS Left ventricular ejection fraction, by estimation, is 65 to 70%. The left ventricle has normal  function. The left ventricle has no regional wall motion abnormalities. Left ventricular diastolic parameters are indeterminate. 1. Right ventricular systolic function is normal. The right ventricular size is mildly enlarged. There is mildly elevated pulmonary artery systolic pressure. The estimated right ventricular systolic pressure is 36.6 mmHg. 2. 3. Left atrial size was mild to moderately dilated. 4. Right atrial size was mildly dilated. The mitral valve is normal in structure. Trivial mitral valve regurgitation. No evidence of mitral stenosis. 5. The aortic valve is tricuspid. There is mild calcification of the aortic valve. Aortic valve regurgitation is mild. No aortic stenosis is present. 6. The inferior vena cava is normal in size with greater than 50% respiratory variability, suggesting right atrial pressure of 3 mmHg.   PAST GI PROCEDURES:  Esophageal manometry 02/21/2018: Normal relaxation of the EG junction No significant esophageal peristolic abnormality detected on the study based on [place] classification  pH impedance study 02/21/2018: Good acid suppression on PPI  EGD 04/18/2014: Essentially normal upper endoscopy of esophagus stomach and duodenum.  Small reducible hiatal hernia  Status post biopsies so from the gastric antrum to rule out H. pylori   Colonoscopy 2014: Normal colonoscopy  Small perirectal fibrotic nodule of no clinical significance  Recall colonoscopy 10 years  Colonoscopy 11/11/2006: Diminutive cecal polyp BENIGN HYPERPLASTIC POLYP.   Past Medical History:  Diagnosis Date   Allergy    Arthritis    history spinal stenosis. osteoarthritis right hip   Asthma    Cataract    Coronary artery calcification seen on CAT scan 08/19/2017   >300 on CT scan 08/2017   DDD (degenerative disc disease), thoracic    Depression    DOE (dyspnea on exertion)    a. 04/2010 Lexi MV EF 71%, no ischemia/infarct;     Fatty liver    GERD (gastroesophageal reflux disease)     H/O steroid therapy    Steroid use orally over 4 yrs- for Lung Fibrosis   Heart palpitations 02/28/2015   Helicobacter pylori ab+    Hemorrhoids    Hiatal hernia    High cholesterol    History of chronic bronchitis    as child   History of migraine    History of MRSA infection    Hyperlipidemia, mixed 08/19/2017   Hyperplastic colon polyp 2007   IBS (irritable bowel syndrome)    Inguinal hernia    right   Insulin resistance    past   Interstitial lung disease (HCC)    MVP (mitral valve prolapse)    Posterior mitral valve leaflet with mild MR   Pneumonia    Pneumonitis, hypersensitivity (HCC)    a. 09/2012 s/p Bx - ? 2/2 bird, mold, oil paint exposure ->on steroids,  followed by pulm.   PONV (postoperative nausea and vomiting)    Pre-diabetes    takes Metformin   Pulmonary fibrosis (HCC)    Dr. Marchelle Gearing follows- stable at present   PVC (premature ventricular contraction) 08/19/2017   Rapid heart rate    Dr. Mayford Knife follows- last visit Epic note 9'16   Squamous cell carcinoma of skin    Tinnitus, right ear    Vocal cord ulcer    Past Surgical History:  Procedure Laterality Date   37 HOUR PH STUDY N/A 02/21/2018   Procedure: 24 HOUR PH STUDY;  Surgeon: Napoleon Form, MD;  Location: [facility];  Service: Endoscopy;  Laterality: N/A;   BREAST BIOPSY Right 2009   BIOPSY, pt denies   BREAST BIOPSY Left 2003   Benign    CATARACT EXTRACTION Left    CESAREAN SECTION     COLONOSCOPY     ESOPHAGEAL MANOMETRY N/A 02/21/2018   Procedure: ESOPHAGEAL MANOMETRY (EM);  Surgeon: Napoleon Form, MD;  Location: [facility];  Service: Endoscopy;  Laterality: N/A;   FOOT FRACTURE SURGERY  2006 or 2007   right   HYMENECTOMY     LUNG BIOPSY  09/28/2012   Procedure: LUNG BIOPSY;  Surgeon: Loreli Slot, MD;  Location: [facility] OR;  Service: Thoracic;  Laterality: N/A;  lung biopsies tims three   SQUAMOUS CELL CARCINOMA EXCISION Left    left arm   TOTAL HIP ARTHROPLASTY Right  09/10/2015   Procedure: RIGHT TOTAL HIP ARTHROPLASTY ANTERIOR APPROACH;  Surgeon: Durene Romans, MD;  Location: [facility] ORS;  Service: Orthopedics;  Laterality: Right;   TOTAL HIP ARTHROPLASTY Left 10/03/2019   Procedure: TOTAL HIP ARTHROPLASTY ANTERIOR APPROACH;  Surgeon: Durene Romans, MD;  Location: [facility];  Service: Orthopedics;  Laterality: Left;  70 mins   TUBAL LIGATION     UPPER GI ENDOSCOPY     VIDEO ASSISTED THORACOSCOPY  09/28/2012   Procedure: VIDEO ASSISTED THORACOSCOPY;  Surgeon: Loreli Slot, MD;  Location: [facility] OR;  Service: Thoracic;  Laterality: Right;   VIDEO BRONCHOSCOPY  11/19/2011   Procedure: VIDEO BRONCHOSCOPY WITH FLUORO;  Surgeon: Kalman Shan, MD;  Location: [facility]COPY;  Service: Endoscopy;;   Current Outpatient Medications on File Prior to Visit  Medication Sig Dispense Refill   acetaminophen (TYLENOL) 500 MG tablet Tylenol Extra Strength     albuterol (PROAIR HFA) 108 (90 Base) MCG/ACT inhaler Inhale 2 puffs into the lungs every 4 (four) hours as needed for wheezing. Or coughing spells.  You may use 2 Puffs 5-10 minutes before exercise. 1 Inhaler 3   Alpha-D-Galactosidase (BEANO PO) Take 1 tablet by mouth as needed (if eating onion or garlic).      AMBULATORY NON FORMULARY MEDICATION Allergy injections Once a week     Ascorbic Acid (VITAMIN C) 1000 MG tablet Take 1,000 mg by mouth daily.     ASHWAGANDHA PO Take 2 capsules by mouth daily.     azelastine (ASTELIN) 0.1 % nasal spray Place 1 spray into both nostrils at bedtime.     Calcium Carb-Cholecalciferol (CALCIUM 500 + D PO) Take 2 tablets by mouth daily at 6 (six) AM.     chlorpheniramine (CHLOR-TRIMETON) 4 MG tablet Take 2 mg by mouth 2 (two) times daily. Takes half tablet twice daily     cholecalciferol (VITAMIN D3) 25 MCG (1000 UNIT) tablet Take 1,000 Units by mouth every other day.     ciclopirox (PENLAC) 8 % solution See admin instructions.  dicyclomine (BENTYL) 10 MG capsule Take 1 capsule by  mouth daily as needed.     diltiazem (CARDIZEM CD) 120 MG 24 hr capsule Take 1 capsule (120 mg total) by mouth daily. 90 capsule 3   diphenhydrAMINE-PE-APAP (DELSYM COUGH/COLD NIGHT TIME) 12.5-5-325 MG/10ML LIQD Take by mouth as needed.     EPINEPHrine 0.3 mg/0.3 mL IJ SOAJ injection Inject into the muscle.     ezetimibe (ZETIA) 10 MG tablet Take 1 tablet (10 mg total) by mouth daily. Please keep upcoming appt for future refills. Thank you. 90 tablet 3   fluticasone (FLONASE) 50 MCG/ACT nasal spray Spray 2 sprays into each nostril every day. 48 g 1   fluticasone furoate-vilanterol (BREO ELLIPTA) 100-25 MCG/ACT AEPB Inhale 1 puff into the lungs daily. 180 each 3   glimepiride (AMARYL) 1 MG tablet Take 1 mg by mouth every morning.     lactase (LACTAID) 3000 units tablet Take by mouth.     levocetirizine (XYZAL) 5 MG tablet Take 5 mg by mouth at bedtime.     metFORMIN (GLUCOPHAGE-XR) 500 MG 24 hr tablet Take 500 mg by mouth 2 (two) times daily with a meal.     metoprolol succinate (TOPROL-XL) 25 MG 24 hr tablet Take 1 tablet (25 mg total) by mouth daily. 90 tablet 3   montelukast (SINGULAIR) 10 MG tablet TAKE 1 TABLET BY MOUTH EVERYDAY AT BEDTIME 90 tablet 2   Multiple Vitamin (MULTIVITAMIN) tablet Take 1 tablet by mouth daily.     nystatin-triamcinolone (MYCOLOG II) cream nystatin-triamcinolone 100,000 unit/g-0.1 % topical cream     omeprazole (PRILOSEC OTC) 20 MG tablet Take 20 mg by mouth daily.     OXYGEN Inhale into the lungs. Up to 6 liters as needed during the day and nite time use on/off     OZEMPIC, 0.25 OR 0.5 MG/DOSE, 2 MG/3ML SOPN Inject 0.25 mg into the skin once a week.     pravastatin (PRAVACHOL) 40 MG tablet Take 1 tablet (40 mg total) by mouth every evening. 90 tablet 1   predniSONE (DELTASONE) 10 MG tablet Take 1 tablet (10 mg total) by mouth daily with breakfast. 30 tablet 2   predniSONE (DELTASONE) 5 MG tablet TAKE ONE TABLET BY MOUTH DAILY WITH BREAKFAST 90 tablet 3   Sodium  Chloride, Inhalant, 7 % NEBU Use per nebulizer once a day to help clear sputum 120 mL 3   nitroGLYCERIN (NITROSTAT) 0.4 MG SL tablet Place 1 tablet (0.4 mg total) under the tongue every 5 (five) minutes as needed for chest pain. (Patient not taking: Reported on 07/23/2023) 25 tablet 12   Current Facility-Administered Medications on File Prior to Visit  Medication Dose Route Frequency Provider Last Rate Last Admin   methylPREDNISolone acetate (DEPO-MEDROL) injection (RADIOLOGY ONLY) 120 mg  120 mg Intramuscular Once Omar Person, MD       Allergies  Allergen Reactions   Atorvastatin Other (See Comments)    Leg pain   Betadine [Povidone Iodine] Other (See Comments)    blisters   Codeine Nausea And Vomiting and Other (See Comments)    Other reaction(s): Vomiting   Garlic Diarrhea   Hydrocodone Nausea And Vomiting and Other (See Comments)   Macrobid [Nitrofurantoin] Other (See Comments)   Ofev [Nintedanib] Other (See Comments)    Abdominal pain   Onion Diarrhea   Other Other (See Comments)   Rosuvastatin Other (See Comments)    Leg pain   Shellfish Allergy Nausea And Vomiting   Statins Other (  See Comments)    "Leg pain", Other reaction(s): Other (See Comments), Other (See Comments), "Leg pain", Leg pain, Leg pain   Sulfa Antibiotics    Sulfonamide Derivatives Other (See Comments)    headaches   Current Medications, Allergies, Past Medical History, Past Surgical History, Family History and Social History were reviewed in [employer] record.  Review of Systems:   Constitutional: Negative for fever, sweats, chills or weight loss.  Respiratory: See HPI.  Cardiovascular: Negative for chest pain, palpitations and leg swelling.  Gastrointestinal: See HPI.  Musculoskeletal: Negative for back pain or muscle aches.  Neurological: Negative for dizziness, headaches or paresthesias.   Physical Exam: BP 110/60 (BP Location: Left Arm, Patient Position: Sitting, Cuff  Size: Normal)   Pulse 88   Ht 4\' 11"  (1.499 m)   Wt 136 lb 4 oz (61.8 kg)   BMI 27.52 kg/m   General: 72 year old female in no acute distress. Head: Normocephalic and atraumatic. Eyes: No scleral icterus. Conjunctiva pink . Ears: Normal auditory acuity. Mouth: Dentition intact. No ulcers or lesions.  Lungs: Breath sounds coarse throughout without crackles, rhonchi or wheezes Heart: Regular rate and rhythm, no murmur. Abdomen: Soft, nontender and nondistended. No masses or hepatomegaly. Normal bowel sounds x 4 quadrants.  Rectal: Deferred.  Musculoskeletal: Symmetrical with no gross deformities. Extremities: No edema. Neurological: Alert oriented x 4. No focal deficits.  Psychological: Alert and cooperative. Normal mood and affect  Assessment and Recommendations:  72 year old female presents to schedule screening colonoscopy. Colonoscopy 10/2006 identified 1 small hyperplastic polyp removed from the cecum. Colonoscopy 05/2013 was normal. No known family history of colon polyps or colorectal cancer. -Patient is high risk for any endoscopic evaluation secondary to her ILD and other comorbidities. Her history of a hyperplastic polyp per colonoscopy in 2007 does not increase her risk for colorectal cancer and her most recent colonoscopy 05/2013 was normal,  no polyps. I discussed a Cologuard test versus a conventional colonoscopy at [facility]. To discuss further with Dr. Rhea Belton. -Pulmonary clearance required prior to scheduling a colonoscopy  History of GERD, active reflux symptoms may be contributing to flares of interstitial lung disease.  Patient takes Prilosec 10 mg daily. -Consider EGD to rule out reflux esophagitis/Barrett's esophagus at [facility]  -Pulmonary clearance required prior to scheduling a future EGD -Omeprazole 20 mg p.o. daily  Interstitial lung disease on chronic Prednisone. Stable PFTs.  Referred to cardiology for right heart catheterization to  evaluate for pulmonary hypertension  Variable bowel pattern Benefiber 1 tablespoon daily as tolerated Consider switching metformin to extended release, defer to PCP  DM type II on Metformin

## 2023-07-23 NOTE — Telephone Encounter (Signed)
Okay for EGD and colonoscopy but cannot have a respiratory infection in 2 to 4 weeks prior to the procedure and also better the procedure is done at [place] long with anesthesia support

## 2023-07-23 NOTE — Telephone Encounter (Signed)
Ok. Thanks!

## 2023-07-23 NOTE — Patient Instructions (Addendum)
Pulmonary clearance is required prior to scheduling your procedure.  Benefiber- 1 tablespoon daily as tolerated  Due to recent changes in healthcare laws, you may see the results of your imaging and laboratory studies on MyChart before your provider has had a chance to review them.  We understand that in some cases there may be results that are confusing or concerning to you. Not all laboratory results come back in the same time frame and the provider may be waiting for multiple results in order to interpret others.  Please give [place] 48 hours in order for your provider to thoroughly review all the results before contacting the office for clarification of your results.   Thank you for trusting me with your gastrointestinal care!   Alcide Evener, CRNP

## 2023-07-23 NOTE — Telephone Encounter (Signed)
   Rhonda [name] 1951-04-21 [id number]  Dear Dr.Ramaswamy :  We plan to schedule the above named patient for a(n) EGD & Colonoscopy procedure. Our records show that (s)he is a patient under your pulmonary care .  Please advise as to whether the patient is at acceptable risk from a pulmonary stnadpoint.  Please route your response to Nyrah Demos,CMA or fax response to [phone].  Sincerely,     Gastroenterology

## 2023-07-29 ENCOUNTER — Encounter: Payer: Self-pay | Admitting: Cardiology

## 2023-08-01 NOTE — Telephone Encounter (Signed)
Dr. Rhea Belton, refer to office visit 07/23/2023, please verify if you want the patient to schedule an EGD and colonoscopy with you at [facility].  Pulmonary clearance received, see note from Dr. Marchelle Gearing below.

## 2023-08-02 NOTE — Progress Notes (Signed)
Addendum: Reviewed and agree with assessment and management plan. Patient has sent another MyChart message and decided against screening colonoscopy.  This is certainly her decision. Dr. Marchelle Gearing weighed in and that any procedure would need to be done in the outpatient hospital setting and at least 2 weeks out from any exacerbation of ILD Would continue GERD management and discontinue plan for screening colonoscopy based on patient's desire Rhonda [name], Carie Caddy, MD

## 2023-08-02 NOTE — Telephone Encounter (Signed)
Rhonda [name] Patient has decided against screening colonoscopy which is certainly okay with me if this is her desire The procedures would need to be done in the outpatient hospital setting per pulmonary and Dr. Marchelle Gearing Should she change her mind in the future we can discuss at that time

## 2023-08-04 ENCOUNTER — Telehealth: Payer: Self-pay

## 2023-08-04 NOTE — Telephone Encounter (Signed)
Message Received: Today Arnaldo Natal, NP  Emeline Darling, RN      Previous Messages  Routed Note  Author: Arnaldo Natal, NP Service: Gastroenterology Author Type: Nurse Practitioner  Filed: 08/04/2023  5:53 AM Encounter Date: 07/23/2023 Status: Signed  Editor: Arnaldo Natal, NP (Nurse Practitioner)   Viviann Spare, I replied to patient's mychart message. Dr. Rhea Belton is ok with patient's decision to not schedule a screening colonoscopy.   Pls contact patient and let her know to continue her GERD treatment. I recommend scheduling her for a follow up appointment in 3 months with Dr. Rhea Belton to reassess her GERD symptoms. THX.      Pt was contacted and made aware of Dr. Rhea Belton and Alcide Evener NP recommendations: Pt was scheduled for an office visit on 12/02/2023 at 10:10 AM with Dr. Rhea Belton. Pt made aware Pt verbalized understanding with all questions answered.

## 2023-08-04 NOTE — Progress Notes (Signed)
Rhonda [name], I replied to patient's mychart message. Dr. Rhea Belton is ok with patient's decision to not schedule a screening colonoscopy.  Pls contact patient and let her know to continue her GERD treatment. I recommend scheduling her for a follow up appointment in 3 months with Dr. Rhea Belton to reassess her GERD symptoms. THX.

## 2023-08-09 ENCOUNTER — Encounter: Payer: Self-pay | Admitting: Internal Medicine

## 2023-08-10 ENCOUNTER — Encounter: Payer: Self-pay | Admitting: *Deleted

## 2023-08-10 MED ORDER — DOXYCYCLINE HYCLATE 100 MG PO TABS: 100.0000 mg | ORAL_TABLET | Freq: Two times a day (BID) | ORAL | 0 refills | Status: DC

## 2023-08-10 MED ORDER — PREDNISONE 10 MG PO TABS: ORAL_TABLET | ORAL | 0 refills | Status: DC

## 2023-08-10 NOTE — Telephone Encounter (Signed)
Spoke to patient yesterday. Post covid having green sputum and exertional desats.  Plan  - Take doxycycline 100mg  po twice daily x 5 days; take after meals and avoid sunlight  - Please take Take prednisone 40mg  once daily x 3 days, then 30mg  once daily x 3 days, then 20mg  once daily x 3 days and then go to baseline prednisone dose -> if she needs longer we can do that  [employer] 08/10/2023

## 2023-09-05 ENCOUNTER — Other Ambulatory Visit: Payer: Self-pay | Admitting: Internal Medicine

## 2023-09-27 ENCOUNTER — Ambulatory Visit: Payer: Medicare PPO | Admitting: Internal Medicine

## 2023-09-27 ENCOUNTER — Encounter (HOSPITAL_BASED_OUTPATIENT_CLINIC_OR_DEPARTMENT_OTHER): Payer: Medicare PPO

## 2023-09-28 ENCOUNTER — Other Ambulatory Visit: Payer: Self-pay | Admitting: Cardiology

## 2023-09-28 MED ORDER — DILTIAZEM HCL ER COATED BEADS 120 MG PO CP24: 120.0000 mg | ORAL_CAPSULE | Freq: Every day | ORAL | 1 refills | Status: DC

## 2023-10-01 ENCOUNTER — Ambulatory Visit (HOSPITAL_BASED_OUTPATIENT_CLINIC_OR_DEPARTMENT_OTHER): Payer: Medicare PPO | Admitting: Internal Medicine

## 2023-10-01 DIAGNOSIS — J849 Interstitial pulmonary disease, unspecified: Secondary | ICD-10-CM

## 2023-10-01 LAB — PULMONARY FUNCTION TEST
DL/VA % pred: 81 %
DL/VA: 3.47 ml/min/mmHg/L
DLCO cor % pred: 58 %
DLCO cor: 9.73 ml/min/mmHg
DLCO unc % pred: 58 %
DLCO unc: 9.73 ml/min/mmHg
FEF 25-75 Pre: 2.18 L/s
FEF2575-%Pred-Pre: 146 %
FEV1-%Pred-Pre: 73 %
FEV1-Pre: 1.31 L
FEV1FVC-%Pred-Pre: 122 %
FEV6-%Pred-Pre: 63 %
FEV6-Pre: 1.43 L
FEV6FVC-%Pred-Pre: 105 %
FVC-%Pred-Pre: 60 %
FVC-Pre: 1.43 L
Pre FEV1/FVC ratio: 92 %
Pre FEV6/FVC Ratio: 100 %

## 2023-10-01 NOTE — Progress Notes (Signed)
Spirometry and DLCO Performed Today.  

## 2023-10-01 NOTE — Patient Instructions (Signed)
Spirometry and DLCO Performed Today.  

## 2023-10-06 ENCOUNTER — Ambulatory Visit: Payer: Medicare PPO | Admitting: Internal Medicine

## 2023-10-06 ENCOUNTER — Encounter: Payer: Self-pay | Admitting: Internal Medicine

## 2023-10-06 VITALS — BP 116/60 | HR 91 | Ht 60.0 in | Wt 138.8 lb

## 2023-10-06 DIAGNOSIS — Z79899 Other long term (current) drug therapy: Secondary | ICD-10-CM | POA: Diagnosis not present

## 2023-10-06 DIAGNOSIS — D84821 Immunodeficiency due to drugs: Secondary | ICD-10-CM | POA: Diagnosis not present

## 2023-10-06 DIAGNOSIS — J849 Interstitial pulmonary disease, unspecified: Secondary | ICD-10-CM | POA: Diagnosis not present

## 2023-10-06 DIAGNOSIS — J679 Hypersensitivity pneumonitis due to unspecified organic dust: Secondary | ICD-10-CM | POA: Diagnosis not present

## 2023-10-06 NOTE — Patient Instructions (Addendum)
   Hypersensitivity pneumonitis (HCC) ILD (interstitial lung disease) (HCC) Current chronic use of systemic steroids  - ILD more stable with higher dose of steroids but with caveat of side effect profile  Plan - Cotninue prednisone  20 mg daily  - Continue to use oxygen as needed for exertional hypoxemia - Ok to hold off right heart catheterization  Continue to control acid reflux well - discussed bactrim prophylaxis; will hold off for now  Tachycardia  Plan  - check with Dr  Antoine Poche if IVABRADINE is a better fit for inappropriate fast heart rate    Followup - spirometry and dlco in 6 months  - 15 min visit in 6 mmoths with DR Marchelle Gearing

## 2023-10-06 NOTE — Progress Notes (Signed)
2012 and 2013  #GE reflux with small hiatal hernia  - on ppi   #History of rapid heart rate not otherwise specified  - Start her on Lopressor  2008/2009 by primary care physician. History appears to correlate with onset of respiratory issues  - refuses to dc this drug as of 2014 discussion   # Hypersensitivity Pneumonitis and ILD  - Potential etiologies - cockateil x 2 for 18 years through end 2012. 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 Denies mold but lives in house built in [place] with a "weird baselment: and has humidifier  - first noted on CT chest 10/15/09 following trip to [place] (PE negative) but not described in 2003 CT chest report  - autoimmune panel: 10/23/11: Negative  -  Uderwennt bronch 11/19/11  - non diagnostic  - VATS Nov 2013 (done after initially refusing)- CRONIC HYPERSENSITIVITY PNEUMONITIS WITH GRANULOMA. However, there is worrying trend of UIP pattern in the Upper lobes.         OV 06/17/2017  Chief Complaint  Patient presents with   Follow-up    [name]. Pt denies significant cough, denies CP/tightness, f/c/s.     She is better. Feels she does not need o2. Has been to [facility] for transplant clinic; felt too early. Seems higher dose prednisone helping but then she is also bettter t his time of the year. In May 2018 I was at [facility] and curbsided national thought leaders - feel that we could explore silent active GERD as a possibility of ILD getting wors with time.   OV 01/11/2018  Chief Complaint  Patient presents with   Follow-up    PFT done today.  Pt states she has been coughing x2 weeks since she returned from a trip to [place]. From coughing, pt has had pain in ribs. Pt states she has mild SOB which is stable due to rehab.  Pt states that Dr. Hal Hope is wanting to know if pt could possibly have eosinophilic esophagitis.   Went to [place]. Returned  and has been coughing x 2 weeks. Associated with this is some rib pain on right side. She feels she does not need o2. She feels she does not need pred/abx at this time. She also exposed to flu wit family   OV 02/08/2018  Chief Complaint  Patient presents with   Acute Visit    CXR done 02/07/18.  Pt states her throat is sore but not as bad as it was yesterday when in office and does have some left ear pain. Pt states she has some pain on right rib cage and has increased fatigue.     Ms. Steenberg presents acutely.  I last saw her one month ago gave her Tamiflu for prophylaxis after flu exposure.  At that point in time she was already reducing her chronic stable prednisone of 10 mg for interstitial lung disease/hypersensitivity pneumonitis.  She had tapered herself slowly 3 or 4 weeks ago to 5 mg.  She says she was doing fairly okay but in the last 2 or 3 weeks she has had increased fatigue.  She says she goes daily swimming and then feels energized but then an hour later starts having fatigue which is more than usual.  Also in the daytime she has random fatigue.  In terms of her respiratory symptoms these are stable and in fact slightly better in terms of  shortness of breath and cough but she is having sore throat without any fever and some left-sided otalgia.  In addition she is having right-sided chest pains that are more than usual.  The chest pains have been reported before.  These are specifically at localized spots along the previous incision several years ago for interstitial lung disease.  They are tender as well to touch.  This pain is worse.  In addition she is also being bothered by arthralgia in her hand and left hip.  The left hip arthralgias old with a hand arthralgias new.  She is worried about pneumothorax recurrence and so she had a chest x-ray yesterday that shows no pneumothorax.  She has stable interstitial lung disease changes but in my personal visualization these interstitial lung  disease changes are worse compared to several years ago.  We do know that she has slowly worsening interstitial lung disease.  In terms of a walking desaturation test this shows stability since last visit.  She has seen GI for acid reflux and has pH probe study coming up  In talking to her she tells me she has been on metformin for over a month or 2.  This is new and is meant for pre-diabetes.  Apparently her hemoglobin A1c is always below 6 but is above 5.5.  Walking desaturation test on 02/08/2018 185 feet x 3 laps on ROOM AIR:  did walk normal pace with forehead probe desaturate. Rest pulse ox was 99%, final pulse ox was 93%. HR response 72/min at rest to 93/min at peak exertion. Patient WADIYA PHANN  Did not Desaturate < 88% . Rhonda [name] yes did  Desaturated </= 3% points. Rhonda [name] yes did get tachyardic   Dg Chest 2 View  Result Date: 02/07/2018 CLINICAL DATA:  Chest wall pain for 2 weeks EXAM: CHEST - 2 VIEW COMPARISON:  CT 08/20/2017, radiograph 10/11/2015 FINDINGS: Coarse interstitial pattern consistent with pulmonary fibrosis, similar distribution compared to prior. No focal pulmonary opacity or pleural effusion. Stable cardiomediastinal silhouette with aortic atherosclerosis. No pneumothorax. Degenerative changes of the spine. IMPRESSION: No active cardiopulmonary disease. Similar appearance of diffuse pulmonary fibrosis. Negative for a pneumothorax. Electronically Signed   By: Jasmine Pang M.D.   On: 02/07/2018 14:26    OV 04/13/2018  Chief Complaint  Patient presents with   Follow-up    Pt had pre spiro and DLCO PFT prior to OV. Pt has increase of productive cough-thick white in last week. Pt has some wheezing, and allergy issues.   [name] presents for routine follow-up.  I saw her acutely just approximately 2 months ago.  Then end of April 2019 she saw a nurse practitioner again acutely and was given antibiotic and prednisone.  She says she felt better after  that but in the last week or so due to increased pollen load in the community and also moving furniture because her daughter is relocating out of her home she started having more cough.  There is no change in her dyspnea with the cough is really bad.  This is despite Singulair and Chlor-Trimeton.  Those things to help but just take the edge off.  She feels that the pollen making the cough worse.  There is no change in dyspnea.  Walking desaturation test documented below his baseline.  She had pulmonary function test today but the [facility] shows significant decline compared to February 2019 and she feels surprised by this.  DLCO is baseline unchanged and a walking  desaturation test limited in the office is also unchanged.  She does not have any fever but has significant postnasal drip.  She has not followed up at [facility] transplant clinic or the allergist recently.  OV 08/16/2018  Subjective:  Patient ID: Rhonda [name], female , DOB: 03/31/1951 , age 11 y.o. , MRN: [id number] , ADDRESS: [address] [place] [address]   08/16/2018 -   Chief Complaint  Patient presents with   Follow-up    PFT performed today.  Pt states she has had a lot of mucus the month of September 2019 so she has been taking the Chlor-Trimeton. States other than that she has been able to do a lot more activities and has been swimming a lot more. Pt states she believes the SOB is stable.     HPI JOURNEI FOUST 72 y.o. -continues to do well.  She is taking higher dose of prednisone 10 mg/day.  Also the summer usually she is doing well.  She is not having much of a cough.  Lung function test shows improvement compared to tests done earlier this year.  In fact she is almost as good as much 2018.  Still compared to 2014-2016 she has progressive lung disease.  Walking desaturation test is stable.  She needs a high-dose flu shot but we do not have stock today.  She has lost some weight intentionally with better dietary  control.  She is exercising regularly swimming.  We discussed ofev  potential future therapy if the drug is approved for this indication.  We will know study results in a year or so.  She is open to the idea but is worried somewhat about her irritable bowel syndrome flaring up.  She is no longer following at the [facility] lung [facility] clinic given stability lung function  Other issues: She always has right-sided chest wall pain to the site of lung biopsy.  The only way this is been resolved as by her limiting her use of bra .  Also her irritable bowel syndrome is under control but in case it flares up she wants a refill of dicyclomine     OV 12/15/2018  Subjective:  Patient ID: Rhonda [name], female , DOB: August 23, 1951 , age 52 y.o. , MRN: [id number] , ADDRESS: [address] [place] [address]   12/15/2018 -   Chief Complaint  Patient presents with   Follow-up    Pt states she has been doing better since last visit with TP but states she has been having pain in her rib cage x2 years but states it has become more intense.     HPI HASSET STONIS 72 y.o. -returns for follow-up of her ILD due to hypersensitivity pneumonitis.  She did a clinic visit also for research with the ILD-pro registry study.  Since I last saw her in September 2019 she had a flareup and required higher doses of prednisone.  Since then she is returned to baseline of 5 mg prednisone and she feels good.  She is stable.  Walking desaturation test shows stability.  We have been discussing over the last few months about taking nintedanib based on new data and progressive non--IPF ILD's.  She has trepidation for this because of irritable bowel syndrome.  After much discussion she is willing to try 100 mg once daily for a month and then escalate to 100 mg twice daily which is the therapeutic dose.  She will do this once after insurance approval  which we suspect will happen over the next few months.  Her main issue is  that her right sided infra-axillary chest pain is getting worse.  This started after her surgical lung biopsy and pneumothorax on the right side.  It is random and happens with twisting.  But now days it happens once every few days.  Previously it used to happen once every few weeks.  Sometimes it is excruciating but then she is left in pain for a few days.  Applying Abrol sudden twisting of her body makes it worse.  Is a lancinating pain.  She did try gabapentin some years ago for chronic cough .  She did tolerate the gabapentin well.  She does not remember if it helped the pain or not but clearly back and the pain was much less intense.  She is willing to try this again.  I recommended a CT scan of the chest but she is going to have radiation for a CT coronary angiogram because of high levels of coronary artery calcification.  Therefore written to the radiologist to see if he can look at the lung parenchyma with a CT angiogram    OV 05/09/2019  Subjective:  Patient ID: Rhonda [name], female , DOB: Jan 04, 1951 , age 67 y.o. , MRN: [id number] , ADDRESS: [address] [place] [address]   05/09/2019 -   Chief Complaint  Patient presents with   ILD (Interstitial Lung Disease)     HPI PAT REINERTSEN 72 y.o. -follow-up for chronic care physician pneumonitis on daily 5 mg prednisone.  She has problems with right-sided chest wall pain that is neuropathic.  She tells me since her last visit January 2020 she has been isolating and social distancing because of the pandemic.  Overall she is been stable.  Her symptom scores are mild and documented below.  She continues with prednisone.  She was supposed to have pulmonary function test but this was held because of the pandemic.  Her walking desaturation test shows she is stable.  She is supposed to have pulmonary function test tomorrow but wanted to see me today.  In the interim she did have a cardiac CT scan of the chest that shows 94th percentile of  coronary calcium.  She could not get a CT angiogram done because of PVC.  A nuclear medicine stress test was done and I reviewed this result and it was normal.  A Holter test is pending.  She is kind of nervous because of the ongoing COVID-19 situation and safety of doing a Holter test.  In terms of her right chest wall pain she is adapted to it.  She does not use any bra  OV 08/22/2019  Subjective:  Patient ID: Rhonda [name], female , DOB: 08-Jan-1951 , age 68 y.o. , MRN: [id number] , ADDRESS: [address] [place] [address]   08/22/2019 -   Chief Complaint  Patient presents with   Follow-up    needs sx clearance for L hip replacement- not yet scheduled, pending clearance.     Patient lung disease chronic hypersensitive pneumonitis on chronic daily prednisone 5 mg/day  HPI LARYSA MILANI 72 y.o. -presents for follow-up of her interstitial lung disease due to chronic hypersensitive pneumonitis.  After last visit in June 2020 given her PFT stability and also her aversion to the potential side effects with nintedanib she decided to just continue with prednisone daily 5 mg/day.  Given the pandemic she is walking 1 mile a day  without stopping.  Her interstitial lung disease symptom score is actually slightly better.  Overall she reports stability in her interstitial lung disease.  She has a new issue of getting preoperative clearance for her left hip.  She says the left hip is bone-on-bone and she does not want to take Aleve.  She prefers to have surgery.  Dr. Durene Romans is the surgeon.  Surgery date has not been set.  She is very functional.  She has normal renal function.  Normal nutritional status.  No anemia.  No respiratory infections in the last 1 month.  She is not on oxygen.  Age 30.  The surgeries in the left hip and the duration of surgery is under 3 hours.  Anesthesia will be general.      OV 01/30/2020  Subjective:  Patient ID: Rhonda [name], female , DOB: 14-Aug-1951 ,  age 25 y.o. , MRN: [id number] , ADDRESS: [address] [place] [address] Patient lung disease chronic hypersensitive pneumonitis on chronic daily prednisone 5 mg/day  01/30/2020 -   Chief Complaint  Patient presents with   Follow-up    PFT performed 3/8.  Pt states she believes she has been going downhill for the past 2 months. Pt was started on O2 at last OV and wears it prn. Pt does have an occ cough.     HPI LAVENDA VILLALOVOS 72 y.o. -presents for follow-up of interstitial lung disease secondary to chronic hypersensitive pneumonitis.  She maintains herself on prednisone 5 mg/day.  In the interim she has had hip surgery and this did wonders for her.  She is able to walk longer distances.  However she does notice that her dyspnea has declined.  She says since January 2021 his symptoms have declined particularly with cough.  Usually in the winter she goes through a cycle of severe cough and exacerbation that requires higher levels of prednisone.  She has been social distancing well and masking well.  She states there is absolutely no mold or water any antigen exposure at home.  But she feels it is her allergies acting up and making her more symptomatic.  She has had a Covid vaccine and is wondering about either going back to swimming or starting pulmonary rehabilitation.  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  In fact when we walked her today with the mask she did drop down to 89%.  This is a decline for her.  She had pulmonary function test and this shows significant decline in FVC and DLCO.  In review of this that.  Where she has declined this much but is always bounce back with steroids.  At last visit we discussed nintedanib for her as a way to prevent progression of her ILD but given her concerns of GI side effects and the presence of irritable bowel syndrome she is declined.  Infective have this  conversation with her including her joining the [employer] at [facility] [employer] [facility] but she has always been worried about the GI side effects with nintedanib.  This time she is more open to it because of the decline in lung function.  She wants to see an allergist  She also told me that she continues to have a restrictive chest pain particularly on the right side lower area.  She no longer wears a brassiere it has been nearly 3 years since she had a high-resolution CT  chest.  She is open to having one now.  Last liver function test was normal in January 2020.  Last renal function was normal in November 2020.      OV 03/19/2020  Subjective:  Patient ID: Rhonda [name], female , DOB: July 25, 1951 , age 60 y.o. , MRN: [id number] , ADDRESS: [address] [place] [address]  Chronic hypersensitive pneumonitis interstitial lung disease on prednisone daily 10 mg 03/19/2020 -   Chief Complaint  Patient presents with   Follow-up    Pt states she is doing better since last visit and states her breathing has also improved.     HPI JOENE ABRAHA 72 y.o. -at last visit in March 2021 there was decline in lung function.  Therefore we started nintedanib.  She took 1 tablet of 150 mg and then had immediate abdominal pain and fever.  Therefore she is decided against taking this.  I also support this decision because I do not think she will tolerate nintedanib.  We did extensive interstitial lung disease question a history again at that time.  She had taken 1 dose of nitrofurantoin.  Have indicated to her that she should never take this medicine again.  In addition discovered that she might have some feather jackets at home she has now gotten rid of it.  She is now doing pulmonary rehabilitation.  Her overnight oxygen desaturation test is negative.  At pulmonary rehabilitation she tells me she does not drop below 91% and 1 time she went to 89%.  Otherwise overall doing great.  Her cardiologist is increased her Lopressor.   Her daughter is getting married and she is busy with the wedding planning this is being emotionally stressful for her.  Her allergist has increased her recommendations and this is also helping her.  Her allergist is retiring and she wanted recommendations for new allergist.  I have recommended Dr. Madie Reno and Dr. Gary Fleet.  Incidentally that right-sided chest pain that she had is also better with increased prednisone of 10 mg/day.  She feels prednisone is working really well for her.    OV 08/29/2020   Subjective:  Patient ID: Rhonda [name], female , DOB: 1950/12/02, age 11 y.o. years. , MRN: [id number],  ADDRESS: [address] [place] [address] PCP  Dois Davenport, MD Providers : Treatment Team:  Attending Provider: Kalman Shan, MD Patient Care Team: Dois Davenport, MD as PCP - General (Family Medicine) Rollene Rotunda, MD as PCP - Cardiology (Cardiology) Kalman Shan, MD as Consulting Physician (Pulmonary Disease) Daleen Squibb, Jesse Sans, MD (Inactive) (Cardiology) Linna Darner, RD as Dietitian (Family Medicine) Rollene Rotunda, MD as Consulting Physician (Cardiology)  Follow-up chronic hypersensitive pneumonitis on chronic prednisone 10 mg/day.  Last high-resolution CT chest March 2021.  Intolerant to nintedanib.  Chief Complaint  Patient presents with   Follow-up    Pt states she had been doing well since last visit up until 3-4 weeks ago and started having problems with her breathing and has had to use her O2 with activities. Pt also has been coughing a lot and is getting up clear phlegm. Pt also has had some mild wheeze.       HPI NAYDEAN KINNIE 72 y.o. -returns for follow-up.  Since I last saw her her daughter is now married.  She had a great wedding.  She was able to enjoy this and dance quite well.  She barely used her oxygen her effort tolerance was good.  Some 3 weeks  [place].  Apparently she had difficulty  getting her oxygen tank to clinic to the electrical port.  She noticed with exertion a pulse ox dropping to the 70s.  She said even before she left a few days prior to that she started having increasing cough.  She notices that these cough cycles get exacerbated in the fall in the winter.  Looking back at her pulmonary function test there is always a dip between October and March each year.  Between 2015 and 2017 there was a decline and since then there is a waxing and waning quality with dips in the winter in the fall season.  Each time she responds to prednisone.  Most recent pulmonary function test in August 2021 is actually improved compared to March 2021.  However today she is feeling worse.  She feels something is in her airway.  She feels dry air in the house and the animals bringing leaves from outside is contributing.  She says she feels better upstairs than downstairs where the dogs.  She again denies any mold or mildew.  Denies any feather jacket exposure.  She feels she will benefit from another round of prednisone.  She feels in the past antibiotics have not helped her as much as prednisone.   In terms of chronic ILD HP: She not tolerate nintedanib in the past.  I explained to her that I am concerned about progression and her decline in functional quality and the risk for that.  We discussed pirfenidone as an alternative.  Discussed the fact is less well studied although have no reason to see why it would not be effective.  Explained the side effect profile.  She is willing to try this.  We decided to go donor samples from the patient support group  Oxygen qualification: We walked her and she qualified for oxygen today.  Is for portable oxygen with [employer]  Other issue: Spring allergies: -Her allergist is retired.  We discussed options of referral.  Referred her to Dr. Aris Georgia referred to     OV 01/14/2021  Subjective:  Patient ID: Rhonda [name], female , DOB: 28-Mar-1951 , age 77 y.o. ,  MRN: [id number] , ADDRESS: [address] [place] [address] PCP Dois Davenport, MD Patient Care Team: Dois Davenport, MD as PCP - General (Family Medicine) Rollene Rotunda, MD as PCP - Cardiology (Cardiology) Kalman Shan, MD as Consulting Physician (Pulmonary Disease) Daleen Squibb, Jesse Sans, MD (Inactive) (Cardiology) Linna Darner, RD as Dietitian (Family Medicine) Rollene Rotunda, MD as Consulting Physician (Cardiology)  This Provider for this visit: Treatment Team:  Attending Provider: Kalman Shan, MD    01/14/2021 -   Chief Complaint  Patient presents with   Follow-up    Nauseous, loss of appetite    Follow-up chronic hypersensitive pneumonitis on prednisone 5 mg/day and also pirfenidone submaximal dose of 2 pills 3 times daily since October 2021.  On ILD-pro registry protocol  - failed ofev   - failed esbrit due to side effects Larrie Kass 2022  She is also on Breo  HPI DELORIS BIERL 72 y.o. -returns for follow-up.  She is now on pirfenidone along with low-dose prednisone.  She feels the pirfenidone is not reacting well with her at all.  She has intermittent loss of taste at least twice a week.  She has loss of appetite and she has forces herself to eat.  She also has fatigue.  She says that if I strongly recommended then she will continue to soldier on with it.  Otherwise she feels less short of breath.  Pulmonary function test reviewed and is actually stable/slightly improved.  She says the cough is extremely well controlled except today after the pulmonary function test she was coughing quite a bit.  She is looking forward to going back to swimming.  She has had a Covid vaccine in booster and also status post EVUSHELD monoclonal antibody prophylaxis.  We discussed the possibility about possibility of enrolling in inhaled nitric oxide device/administration study.  This is to see if people can improve in the shortness of breath scale and also physical activity.  She took the  consent form and is deliberating.  She thinks she might find the study and inconvenience to her lifestyle where she is wanting to be more active.  We went over several details of this research protocol  She has a new symptom of like itching sensation deep in the right flank.  She feels it is not the skin it is from deep inside.  She is wondering if it could be related to pirfenidone.  Started after going on pirfenidone.  Explained to her that I am not seeing this in other patients with pirfenidone but will have to be open minded.  She does not apply Vaseline to her skin in the winter.  We discussed the possibility this could be dry skin related but she does not have other features of dry skin.  Nevertheless she is willing to try Vaseline.   Results for CHAPP   OV 07/08/2021  Subjective:  Patient ID: Rhonda [name], female , DOB: [birth year] , age 60 y.o. , MRN: [id number] , ADDRESS: [address] [place] [address] PCP Dois Davenport, MD Patient Care Team: Dois Davenport, MD as PCP - General (Family Medicine) Rollene Rotunda, MD as PCP - Cardiology (Cardiology) Kalman Shan, MD as Consulting Physician (Pulmonary Disease) Daleen Squibb, Jesse Sans, MD (Inactive) (Cardiology) Linna Darner, RD as Dietitian (Family Medicine) Rollene Rotunda, MD as Consulting Physician (Cardiology)  This Provider for this visit: Treatment Team:  Attending Provider: Kalman Shan, MD    07/08/2021 -  ACUTE VISIT Chief Complaint  Patient presents with   Acute Visit    Pt states that she did start taking zpak yesterday 8/15 and said her cough is doing some better after being on second day of abx and said that she started prednisone today 8/16. States that she still has increased SOB. Pt did take a covid test which came back negative.   Follow-up chronic hypersensitive pneumonitis on prednisone 5 mg/day and also pirfenidone submaximal dose of 2 pills 3 times daily since October 2021.  On ILD-pro registry  protocol - failed ofev   - failed esbrit due to side effects Larrie Kass 2022  She is also on Breo   HPI EVIAN MERISIER 72 y.o. -this is an acute visit.  Last seen in February 2022 after that she was supposed to see me back in 3 months.  But this visit has been scheduled acutely.  She tells me that early in May 2022 she went to [facility].  They rented a house.  She believes that the bedroom had some mold.  She did not visually confirmed the mold but husband might have.  She says shortly after going there and spending a week she started having worsening cough with hoarseness of voice.  This cough is persisted all along and the  hoarseness has persisted as well.  The in June 2022 she had a short course of steroids that did not help.  She believes the dosing was not strong enough.  In July 2022 she had some back issues and was given 30 mg of prednisone for approximately 10 days with a taper and this helped her back.  But all along the cough is persisted and severe hoarseness is persisted.  Then approximately a week ago she started getting shortness of breath along with yellow sputum.  The cough got worse .  She states even at baseline she brings her a lot of white sputum and has to cough a lot.  [place] and a couple of days into the illness.  At this point in time she started noticing she was more easily desaturating.  She is not able to swim.  She says walking to the car her pulse ox dropped to 84%.  Then the yellow phlegm started getting worse.  She called in yesterday and we prescribed Z-Pak and a prednisone burst.  She says now with the Z-Pak the sputum color is improving.  A week ago she also saw Dr. Doran Heater for chronic cough.  100 mg 3 times daily of gabapentin has been prescribed.  She is slowly increasing it.  She is worried about the side effects of this drug.  Apparently her vocal cord had ulcers from repeated coughing.  ILD symptom score shows worsening.  In  the office she is very hoarse   Of note she stopped her pirfenidone in February/March 2022 because of side effects - gerd  She uses oxygen with exertion.  There is no leg swelling or hemoptysis.  Started z pak yesterday Started 12d pred taper yesterday     PFT   OV 08/14/2021  Subjective:  Patient ID: Rhonda [name], female , DOB: May 30, 1951 , age 86 y.o. , MRN: [id number] , ADDRESS: [address] [place] [address] PCP Dois Davenport, MD Patient Care Team: Dois Davenport, MD as PCP - General (Family Medicine) Rollene Rotunda, MD as PCP - Cardiology (Cardiology) Kalman Shan, MD as Consulting Physician (Pulmonary Disease) Daleen Squibb, Jesse Sans, MD (Inactive) (Cardiology) Linna Darner, RD as Dietitian (Family Medicine) Rollene Rotunda, MD as Consulting Physician (Cardiology)  This Provider for this visit: Treatment Team:  Attending Provider: Kalman Shan, MD  Follow-up chronic hypersensitive pneumonitis on prednisone 5 mg/day and also pirfenidone submaximal dose of 2 pills 3 times daily since October 2021.  On ILD-pro registry protocol - failed ofev   - failed esbrit due to side effects Larrie Kass 2022  She is also on Breo  08/14/2021 -   Chief Complaint  Patient presents with   Follow-up    Pt states she is feeling better since last visit. States she did receive her lighterweight POC which has been working well for her.     HPI MUNTAHA MARRANO 72 y.o. -seen last month with worsening symptoms acute bronchitis/flare.  Given Z-Pak and prednisone.  She is significantly better.  She tells me that even though she is better and his subjective symptom score is better below.  She still feels not back to her May 2022 baseline.  She says in the neighborhood she has to use more oxygen.  She is interested in a backpack for oxygen.  She is not able to swim as well as she used to.  She says acid reflux is under better control now  after seeing Dr. Doran Heater in ENT.  She is  wondering about starting pirfenidone she definitely does not want to do nintedanib again.  But even with pirfenidone she is reluctant.  We discussed CellCept because of progression but because of the immunosuppression she is reluctant.  We resolved that she would increase the prednisone to 10 mg/day and reassess.  She was supposed to have done a spirometry today but it did not happen.  She will have it done again in 4 weeks.  Have requested a schedule for that.    The main concern right now even though is better is that she is not to her baseline as of a year ago and she feels like she is having slowly progressive disease interstitial lung disease/chronic HP.       OV 10/03/2021  Subjective:  Patient ID: Rhonda [name], female , DOB: November 14, 1951 , age 34 y.o. , MRN: [id number] , ADDRESS: [address] [place] [address] PCP Dois Davenport, MD Patient Care Team: Dois Davenport, MD as PCP - General (Family Medicine) Rollene Rotunda, MD as PCP - Cardiology (Cardiology) Kalman Shan, MD as Consulting Physician (Pulmonary Disease) Daleen Squibb, Jesse Sans, MD (Inactive) (Cardiology) Linna Darner, RD as Dietitian (Family Medicine) Rollene Rotunda, MD as Consulting Physician (Cardiology)  This Provider for this visit: Treatment Team:  Attending Provider: Kalman Shan, MD    10/03/2021 -   Chief Complaint  Patient presents with   Follow-up    Pt states her breathing has become worse since last visit. States she is having to use her O2 almost all the time now with exertion.    Follow-up chronic hypersensitive pneumonitis on prednisone 5 mg/day and also pirfenidone submaximal dose of 2 pills 3 times daily since October 2021.  On ILD-pro registry protocol - failed ofev due to side efect  - failed esbrit due to sde effects marvh 2022  -Last echo 2018  -Last high-res CT March 2021. -> Nov 2022 stable between 2  She is also on Breo  HPI KLAUDIA SHIPLETT 72 y.o. -returns  for follow-up.  In this visit she is categorically saying that she is significantly worse.  This point in particular relationship to dyspnea on exertion.  Her weight itself is stable.  She is currently on 5 mg prednisone per day [last time I thought I increased it to 10 mg/day].  Nevertheless she says other interim changes is that she has had slow weight gain.  BMI is 30.  She started semaglutide for hemoglobin A 1.C 6.5.  Is also to help weight.  She was also after seeing Dr. Doran Heater for her cough and significant amount of anti-PPI therapy.  She said this made her diarrhea worse.  She then reduced it and currently is taking omeprazole 20 mg/day in the daytime and 40 mg at night and in between Pepcid as well.  She stopped the gabapentin.  She feels after reducing the PPI the diarrhea is improved.  The cough is not as bad as it was even after stopping gabapentin but she continues to be fatigued.  She complains of bloating.  She feels her stomach is full.  She feels acid reflux is worse.  Acid reflux is worse after reducing the dose of H2 blockade.  She also has early satiety.  She also states when she coughs she vomits.  She feels she needs to see Dr. Rayetta Pigg.  I have messaged him and he hasagreed for him or his PA to see  her soon.  Nevertheless symptoms are significantly worse as seen on the symptom score below.  She is also reporting increased tachycardia with exertion and apparently Dr. Antoine Poche adjusted her beta-blocker.  She has upcoming appointment with them.    She says that at times when she desaturates easily at home even for minimal exertion.  We walked her today with a forehead probe and she actually did much better than expected going down to 89% only at the end of 3 labs [this is actually an improvement from the recent past versus stability].  She might have an erratic finger pulse ox probe at home.   Her pulmonary function shows a significant decline in DLCO.  Last echocardiogram 2018.   Last high-resolution CT chest March 2021.  Never had right heart catheterization.  Hemoglobin normal 14.7 g% in August 2022.           OV 10/21/2021  Subjective:  Patient ID: Rhonda [name], female , DOB: 01-Jul-1951 , age 48 y.o. , MRN: [id number] , ADDRESS: [address] [place]  [address] PCP Dois Davenport, MD Patient Care Team: Dois Davenport, MD as PCP - General (Family Medicine) Rollene Rotunda, MD as PCP - Cardiology (Cardiology) Kalman Shan, MD as Consulting Physician (Pulmonary Disease) Daleen Squibb, Jesse Sans, MD (Inactive) (Cardiology) Linna Darner, RD as Dietitian (Family Medicine) Rollene Rotunda, MD as Consulting Physician (Cardiology)  This Provider for this visit: Treatment Team:  Attending Provider: Kalman Shan, MD  Type of visit: Video Circumstance: COVID-19 national emergency Identification of patient TATYANIA FAUROT with 1951/04/03 and MRN [id number] - 2 person identifier Risks: Risks, benefits, limitations of telephone visit explained. Patient understood and verbalized agreement to proceed Anyone else on call: no Patient location:  her home This provider location: [address]; [place]; [place] [address]. [facility]. [phone]    10/21/2021 -     Follow-up chronic hypersensitive pneumonitis on prednisone 5 mg/day and also pirfenidone submaximal dose of 2 pills 3 times daily since October 2021.  On ILD-pro registry protocol - failed ofev due to side efect  - failed esbrit due to sde effects marvh 2022  -Last echo 2018  -Last high-res CT March 2021.  She is also on Breo HPI SHANDRELL TRAVERS 72 y.o. -returns for follow-up via video visit to discuss test results.  She says currently she is just doing PPI lower dose 20 mg omeprazole in the daytime and famotidine at night.  With this her diarrhea is better.  Also bloating is better.  Correlating with this for she feels her lungs are stable.  She does  say that even now when she bends down she can desaturate.  She is not using oxygen at night.  Her primary care is testing her over no at night to see if she really needs it at night.  Nevertheless she feels somewhat better.  She had echocardiogram because of concern of pulmonary hypertension and there is no evidence of pulmonary hypertension.  She had high-resolution CT chest which the radiologist was described as minimally progressive since 18 months earlier in March 2021.  She had pulmonary function test that shows the FVC to be stable in the last few to several months but the DLCO to show slight decline.  Nevertheless the pattern at least compared to 2 years ago is 1 of progressive ILD on the pulmonary function test.  Certainly the hypoxia with easy exertion compared to a few years ago or a year  ago is also a sign that her ILD is getting worse.  She is likely stable or stabilized in the last few to several months.  We discussed going challenge again with nintedanib or pirfenidone but she is not interested.  We discussed doing CellCept and discussed the side effect profile of this.  She is not interested.  We discussed transplant referral for evaluation now that she is progressing so she can get plugged in and have a good conversation.  She is reflected on this from the last visit and she is not interested  We discussed the acid reflux controlled she feels she is at a good place right now with good acid reflux controlled with PPI in the morning and H2 blockade at night.  She has seen GI and at this point I believe they are not going to do pH probe.  I reviewed the note.  She continues on Breo and prednisone 5 mg/day which she plans to continue.  She has heard about the promedior trial with IV Pnetraxin for IPF.  This might be a study for non-- IPF progressive phenotype.  She prefers to do the study because it bypasses the GI route and it is an IV study.  We do not know if the study is going to happen.  We  do not know if you are going to be a site.  If these things all get aligned then maybe we could consider that.        OV 07/21/2022  Subjective:  Patient ID: Rhonda [name], female , DOB: Jun 02, 1951 , age 49 y.o. , MRN: [id number] , ADDRESS: [address] [place] [address] PCP Dois Davenport, MD Patient Care Team: Dois Davenport, MD as PCP - General (Family Medicine) Rollene Rotunda, MD as PCP - Cardiology (Cardiology) Kalman Shan, MD as Consulting Physician (Pulmonary Disease) Daleen Squibb, Jesse Sans, MD (Inactive) (Cardiology) Linna Darner, RD as Dietitian (Family Medicine) Rollene Rotunda, MD as Consulting Physician (Cardiology)  This Provider for this visit: Treatment Team:  Attending Provider: Kalman Shan, MD  She is also on [facility]  07/21/2022 -   Chief Complaint  Patient presents with   Follow-up    PFT performed today.  Pt states she has been doing good since last visit.     HPI KRISHAUNA RAMON 72 y.o. -returns for follow-up.  Overall in the summer months she is doing well.  She went to the [address] of [place] in [place].  In the areas where there was a lot of [place] and clean air she was able to walk down the beach 200 steps and including climb up with the oxygen without desaturation and feeling good.  But when she got to [facility] where there was pollution she started having cough.  She currently feels really good.  She feels well symptom scores are improved.  Walking desaturation test is also improved.  She attributes this to some weight loss and overall healthy living.  She is on Ozempic that is helping her lose weight.  She is swimming.  She does have some cough recently for the last week because of allergies.  Her dog Nicholes Calamity passed away.  Therefore she is taking care of the other dog and this is increasing her allergies and cough but still overall symptoms are improved.  She continues on prednisone 5 mg/day.  Currently she not  interested in any prednisone burst.  Social: She is expecting to be a grandmother in a few  months.     OV 10/27/2022  Subjective:  Patient ID: Rhonda [name], female , DOB: 1951/07/31 , age 30 y.o. , MRN: [id number] , ADDRESS: [address] [place] [address] PCP Dois Davenport, MD Patient Care Team: Dois Davenport, MD as PCP - General (Family Medicine) Rollene Rotunda, MD as PCP - Cardiology (Cardiology) Kalman Shan, MD as Consulting Physician (Pulmonary Disease) Daleen Squibb, Jesse Sans, MD (Inactive) (Cardiology) Linna Darner, RD as Dietitian (Family Medicine) Rollene Rotunda, MD as Consulting Physician (Cardiology)  This Provider for this visit: Treatment Team:  Attending Provider: Kalman Shan, MD    10/27/2022 -   Chief Complaint  Patient presents with   Acute Visit    Pt has been having complaints of increased SOB and feels like she is having a flare up. Pt states she took zpak and prednisone about a month ago. States she has had some desats in oxygen levels.     HPI ADRIAHNA RASSO 72 y.o. -presents for follow-up.  This is an acute visit.  She is now a grandmother.  The granddaughter's name is Laurie Panda.  She is pretty excited about that.  However she is having deterioration in her symptoms.  She says when she saw me in August 2023 she was actually quite well but she thought looking back she might of started with a cough.  She did travel west but then starting September 2023 she started having worsening cough which typically happens in the fall with the leaves get dry.  I prescribed 8-10-day prednisone burst along with Z-Pak.  She states the prednisone always helps her.  However she feels that the prednisone I prescribed was not probably strong enough.  Since then she is having a deterioration in the cough.  She also feels more fatigued.  There is white sputum coming with this.  She is also having worsening shortness of breath for the last 2 or 3 weeks.  She  feels this time the shortness of breath is different and is the more dominant symptoms.  Usually cough is the more dominant symptoms.  The deterioration in shortness of breath really suddenly in the last few weeks.  There are some associated exertional chest pain.  She did see Dr. Antoine Poche recently.  I reviewed his note.  She has had a previous normal stress test so he felt reassured this was not cardiac.  He did contemplate stopping the metoprolol given her "reactive airway disease" [she has hypersensitivity pneumonitis and can occasionally wheeze but I did leave her on metoprolol being a better once because of the beneficial effects on her heart rate with exertion].  Her symptom score showed deterioration  Her walking desaturation test also shows significant deterioration.  Similar to September 2022 but a lot worse.  This the worst its ever been.  There is no fever     OV 12/10/2022  Subjective:  Patient ID: Rhonda [name], female , DOB: [birth year] , age 23 y.o. , MRN: [id number] , AD[address]ESS: [address] [place] [address] PCP Dois Davenport, MD Patient Care Team: Dois Davenport, MD as PCP - General (Family Medicine) Rollene Rotunda, MD as PCP - Cardiology (Cardiology) Kalman Shan, MD as Consulting Physician (Pulmonary Disease) Daleen Squibb, Jesse Sans, MD (Inactive) (Cardiology) Linna Darner, RD as Dietitian (Family Medicine) Rollene Rotunda, MD as Consulting Physician (Cardiology)  This Provider for this visit: Treatment Team:  Attending Provider: Kalman Shan, MD      12/10/2022 -   Chief Complaint  Patient presents with   Follow-up    Pft results from yesterday. Pt states that 10 mg of prednisone is helping.      HPI WINDY COS 72 y.o. -here for follow-up.  After the last visit she called saying 5 mg of prednisone was not helping her.  She told me this time that at 50 mg of prednisone she feels great but then as she tapers and she gets into 10 mg  prednisone she starts getting symptoms and at 5 mg symptoms are significantly worse.  She then called and she is back up to 10 mg and she feels better with just very mild symptoms.  In fact walking desaturation test is more stable her exam is more stable.  She feels she is got hypersensitive airways.  I did indicate to her that it is possible there is allergic or eosinophilic inflammatory component.  However her blood eosinophils have always been normal.  But then it is Mast by chronic prednisone.  Currently she is on 10 mg of prednisone.  We took a shared decision for her to stay on 10 mg prednisone acknowledging the risk because this seems to be the best balance between symptom relief and quality of life.  Even exercise hypoxemia is better.  She did notice that recently in the last few months perfume particularly bothering her.  But otherwise she is stable.  She plans to revisit with her allergist Dr. Eileen Stanford   Current pulmonary function test was reviewed and is stable.      01/20/2023 Acute OV : ILD -chronic hypersensitivity pneumonitis and chronic respiratory failure Patient complains over the last 6 months that she has been having waxing and waning of symptoms with increased cough shortness of breath and decreased activity tolerance.  She has been treated with 3 steroid boost.  She says each time she has gotten much better and actually felt the best that she has felt in a long time.  But within a couple weeks of returning to prednisone 5mg  daily her symptoms started to return.  Most recently started noticing that her breathing was not doing as good 3 weeks ago.  Started to have more cough, congestion thick mucus decreased activity tolerance and increased oxygen demands.  Today in the office walk test shows patient is unable to maintain O2 saturations on pulsed oxygen.  Requires continuous flow oxygen. Patient was started on a prednisone burst yesterday at 40 mg to be tapered by 10 mg every 4 days.   She does feel like she is some better since starting on the higher dose of prednisone yesterday.  Chest x-ray today shows chronic ILD changes.  No acute process. She denies any fever, chest, orthopnea, edema, calf pain.  OV 02/11/2023  Subjective:  Patient ID: Rhonda [name], female , DOB: 1951-01-19 , age 26 y.o. , MRN: [id number] , AD[address]ESS: [address] [place] [address] PCP Dois Davenport, MD Patient Care Team: Dois Davenport, MD as PCP - General (Family Medicine) Rollene Rotunda, MD as PCP - Cardiology (Cardiology) Kalman Shan, MD as Consulting Physician (Pulmonary Disease) Daleen Squibb, Jesse Sans, MD (Inactive) (Cardiology) Linna Darner, RD as Dietitian (Family Medicine) Rollene Rotunda, MD as Consulting Physician (Cardiology)  This Provider for this visit: Treatment Team:  Attending Provider: Kalman Shan, MD    02/11/2023 -   Chief Complaint  Patient presents with   Follow-up    Increase mucous, cough and oxygen drops when she gets down to 10 mg of  prednisone symptoms start to come back.  She remains on 10 mg and states it is not holding her.     HPI DETTE BRIGNAC 72 y.o. -returns for follow-up.  She tells me that compared to a year's this year she is requiring more prednisone.  Even when I saw her last time she felt she needed a baseline of 10 mg prednisone.  We did that.  For years she been on 5 mg prednisone.  She started allergy shots recently Imuran does not helping her.  She is needed quite a few prednisone burst and every time she goes to below 20 mg she feels she is desaturating easily.  In the past it used to be that she could maintain at 5 mg/day.  Then late last year she needed 10 mg low-dose of prednisone per day.  Now she feels even  prednisone 10 mg is not "holding it".  She says when she was on 40 mg prednisone she would climb stairs on room air and the pulse ox would stay at 95% and above.  Now she is on 10 mg/day of prednisone and she is  requiring 5 L of portable oxygen to climb up 1 flight of stairs without desaturations.  She is very worried about her disease.  We did get a high-resolution CT chest and there is no no progression but clinically she is not behaving like that.  Last echocardiogram November 2022 and there was no elevated pulmonary artery pressures.  She is willing to get another echocardiogram.     We discussed the fact she is guarding progressive ILD despite what the CT says.  She has got active inflammation.  I indicated to her that we will commit to an intermediate course of prolonged prednisone at higher dose.  She is willing to do that and accept the risk.  Also indicated to her that we would need to add another steroid sparing and other immunomodulatory agent and would need to check safety labs.  We discussed there is no data on Rituxan for this particular issue of chronic HP progressive pulmonary fibrosis but there is data on CellCept.  She does not like the idea that CellCept can cause diarrhea.  We went over the immunosuppressive and long-term cancer risk.  Did indicate to her that benefit outweighs the risk at this point.  She is agreeable to safety labs and be open to CellCept.  She is committing to CellCept.  But there is a possibility a month from now if she is doing well on the higher dose prednisone and handling it well she might feel differently about CellCept.  We again discussed about lung transplant as an option and she is not interested.   ACUTE OV NAte MEier 03/18/23  ory of chronic HP (has had VATS bx 2013 supportive of dx) f/b MR and seen by [facility] transplant program.   Last seen in clinic 02/11/23. At that point on prednisone 5 mg daily, pirfenidone. Had planned to start cellcept at that visit. Ended up shifting gears and then had been planned for RHC for evaluation for candidacy for tyvaso today. 3 weeks ago started a prednisone taper from 40 mg daily x1 week, currently on 20 mg daily for the rest  of this week.   Never did start z pack. She actually kinda wonders if she has sinus infection.   She does not take bactrim due to history of headaches with sulfa abx.   Weight is stable relative to prior visits  Otherwise pertinent review of systems is negative.     OV 04/13/2023  Subjective:  Patient ID: Rhonda [name], female , DOB: 1951-05-19 , age 21 y.o. , MRN: [id number] , ADDRESS: [address] [place] [address] PCP Dois Davenport, MD Patient Care Team: Dois Davenport, MD as PCP - General (Family Medicine) Rollene Rotunda, MD as PCP - Cardiology (Cardiology) Kalman Shan, MD as Consulting Physician (Pulmonary Disease) Daleen Squibb, Jesse Sans, MD (Inactive) (Cardiology) Linna Darner, RD as Dietitian (Family Medicine) Rollene Rotunda, MD as Consulting Physician (Cardiology)  This Provider for this visit: Treatment Team:  Attending Provider: Kalman Shan, MD    04/13/2023 -   Chief Complaint  Patient presents with   Follow-up    F/up   .   HPI ANDRICA ROWLETTE 72 y.o. -returns for follow-up.  Last seen approximately 2 months ago.  At that time she was tell me she needs a higher dose of prednisone.  Consider CellCept and also right heart catheterization.  After much reflection she declined CellCept and decided to go with right heart catheterization to the research protocol.  Therefore she had to have a second echocardiogram that showed mild elevation of pulmonary artery pressure.  She was going to have a right heart catheterization but then she got ill saw one of my colleagues urgently.  She needed to go back up on the prednisone.  She reports that every time she tapers the prednisone she gets ill.  Therefore Dr. Thora Lance a month ago did a more extended slower taper of prednisone.  Currently she is on 20 mg/day since Apr 10, 2023 and so far her lung function is holding.  In fact her symptoms are better.  Her exercise hypoxemia test is also better and she feels  from a lung perspective a lot better.  She is clearly prednisone responsive.  However the high-dose of prednisone is causing significant changes with her metabolism particular with diabetes.  She is describing vision changes, hyperglycemia increased thirst, fatigue hemoglobin A1c is shot up to 7.5.  Primary care physician Dr. Nadyne Coombes has started on Jardiance and back on Ozempic.  On Apr 08, 2023 lab results review also shows increased hyperlipidemia.    Again had a conversation that she seems to have overall progressive phenotype with hypersensitive pneumonitis that is extremely prednisone sensitive.  Unclear why she is losing control and why we are needing higher dose of prednisone.  I recommended steroid sparing CellCept again.  However she is reluctant particular with the side effect profile.  We went over the cancer risk and opportunistic infection risk.  Side effect of diarrhea.  She wants to hold off.  She prefers to have a right heart catheterization and if that is pulmonary hypertension try inhaled treprostinil both of the pulmonary hypertension and potential theoretical risk of disease modulation.  The research study PHINDER (Dr Judeth Horn PI) can be restarted according to the research coordinator.  But she will have to have another echocardiogram first.  The billing issues when she first got into the study where the bills from the hospital were being sent to her.  She says this is now being resolved and she is no longer receiving those bills.  Did indicate to her that on no account she should be paying recent procedure bills although still should be going to insurance.       OV 06/08/2023  Subjective:  Patient ID: Rhonda [name], female , DOB: 1951/07/24 , age 5 y.o. ,  MRN: [id number] , ADDRESS: [address] [place] [address] PCP Dois Davenport, MD Patient Care Team: Dois Davenport, MD as PCP - General (Family Medicine) Rollene Rotunda, MD as PCP - Cardiology  (Cardiology) Kalman Shan, MD as Consulting Physician (Pulmonary Disease) Daleen Squibb, Jesse Sans, MD (Inactive) (Cardiology) Linna Darner, RD as Dietitian (Family Medicine) Rollene Rotunda, MD as Consulting Physician (Cardiology)  This Provider for this visit: Treatment Team:  Attending Provider: Kalman Shan, MD    06/08/2023 -   Chief Complaint  Patient presents with   Follow-up   HPI Rhonda [name] 72 y.o. -returns for follow-up.  She had pulmonary function test and this appears stable.  She tells me that with the higher dose prednisone she has been doing well and her cough and effort tolerance is really good . However, having sifngianct hyperglycemia.  She is going to meet with primary care physician.  This because she is not able to tolerate JArdiance due to GI side effects. Without Jardiance she feels good but worried about her hyperglycemia. So she wants to drop prednisone to 15mg  per day. I am supportive of this.   Other issue though PFT stable she reports 2 weeks of new coughing that feels different from when her HP cough when she lowers prednisone.  She brings out white sputum after coughing much.  She feels the pain in the throat for the last 1 week.  Her daughter and Asa Lente and son-in-law all have COVID.  They have not taken Paxlovid.  She believes she might have been exposed but she is tested for COVID multiple times.  She was in contact with me about this and the COVID screen negative.  On oral exam I thought there was some thrush.   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 I have messaged Dr Shirlee Latch to set up RHC through [facility]. She went out of window for the phinder study         OV 10/06/2023  Subjective:  Patient ID: Rhonda [name], female , DOB: September 29, 1951 , age 72 y.o. , MRN: [id number] , ADDRESS: [address] [place] [address] PCP Dois Davenport, MD Patient Care Team: Dois Davenport, MD as PCP - General (Family  Medicine) Rollene Rotunda, MD as PCP - Cardiology (Cardiology) Kalman Shan, MD as Consulting Physician (Pulmonary Disease) Daleen Squibb, Jesse Sans, MD (Inactive) (Cardiology) Linna Darner, RD as Dietitian (Family Medicine) Rollene Rotunda, MD as Consulting Physician (Cardiology)  This Provider for this visit: Treatment Team:  Attending Provider: Kalman Shan, MD    10/06/2023 -   Chief Complaint  Patient presents with   Follow-up    Breathing is doing well today and she states "I feel very stable". Currently taking pred 20 mg. She has not used her o2 in a month.      Pft f/u, pt would like to go down on prednisone. Dealing with cough that is worse then baseline, with thick white mucus production.    Follow-up chronic hypersensitive pneumonitis ; slow progressive phenotype -  On ILD-pro registry protocol - On chronic prednisone  - 5mg  per day but stince spring 2024 needing more brsts and settled on 20mg  per day - failed ofev due to side efect  - failed esbriet due to sde effects  March 2022   -Last echo 2018  - declind lung tx referral 2023   - declined cellcept May 2024   - Last high-res CT Nov 222 -> mrach 2024: no change  Lst ECHO   -March 2024 and April 2024: Mild elevation of pulmonary artery pressure  -Right heart cath pending as of July 2024. -> decided against it Nov 2024 due to dong well  HPI KESHONDRA SUPINGER 72 y.o. -returns for follow-up.  She is currently on 20 mg/day prednisone.  She says is the best dose ever.  She feels her lung function is stable.  In fact PFT shows stability.  Excess hypoxemia test is also slightly better.  His symptom scores also showing stability and good control.  She wants to stay at this dose.  She understands that this means more side effects.  She is already seeing a libido for dry skin hyperglycemia.  Her recent bone density test was normal.  She is up-to-date with her vaccination.  We discussed right heart catheterization.  She was  going to have this based on echo from spring 2024 but her BNP at that time was normal currently she is stable.  Therefore we took a shared decision making to hold off on that.  Previously G6PD was normal.  We discussed Bactrim prophylaxis.  Discussed that she is on prednisone more than 10 mg/day but she is not on 2 immunosuppressants with a slight elevated risk and opportunistic infections.  Currently her GI system is feeling well and she is sensitive to medications.  Therefore we took a shared decision making to hold off on Bactrim prophylaxis for PJP and other opportunistic infections.  No other new issues.  Of note she has inappropriate sinus tachycardia.  She is on beta-blocker for this.  She is being followed by Dr. Antoine Poche for this.  She has upcoming visit in March 2025.  I did indicate to her that ivabradine is being used off label for inappropriate sinus tachycardia and she could discuss this with Dr. Antoine Poche.  She acknowledged understanding.        SYMPTOM SCALE - ILD Sept 2020 01/30/2020  08/29/2020  01/14/2021 146# 07/08/2021 147# - off esbriet. Sic past week 08/14/2021 149# - pred only. No antifibrotic 10/03/2021 148# - pred 5mg  07/21/2022 137# 10/27/2022  04/13/2023 20mg  pered - 134# 06/08/2023 135# , pred 20mg  10/06/2023 138#  O2 use  o2 with ex o2 withe ex 2L     2LL o2 with ex 2L with ex 2 laps on RA     Shortness of Breath  0 -> 5 scale with 5 being worst (score 6 If unable to do)            At rest 0 0 1 0 1 0 1 0 1 1  0 0  Simple tasks - showers, clothes change, eating, shaving 01 1 1 1.5 2 1 2 1 2 1 1 1   Household (dishes, doing bed, laundry) 0 3 3 3 4 2  3.5 2 3 2 2 2   Shopping 1 1 1 2 2 1 1 1 2 1 2 1   Walking level at own pace 1 2 2 1 2 2 3 1 2 2 1 3   Walking up Stairs 2 3 3 3 4 3 4 3 4 3 4  2.5  Total (30-36) Dyspnea Score 5 10 11  10.5 15 9  14.5 8 14 10 10 9   How bad is your cough? 1 3 2 2 2 2 2 2 3  0 2 1  How bad is your fatigue 1 2 2 3 2  0 3 1 2 2 4 2   How bad is nausea   0 0 3 0 0 1 0 0  0 0 0  How bad is vomiting?   0 0 0 0 0 1 0 0 0 0  0  How bad is diarrhea?  00 1 0 0 0 1 0 0 0 1  0  How bad is anxiety?  0 0 0 00 0 2 0 00 0 0  0  How bad is depression  0 2 1 1 0 1 0 0 0 0  0  00 0 0 Simple office walk 185 feet x  3 laps goal with forehead probe 04/13/2018  08/16/2018  12/15/2018  05/09/2019  08/22/2019  01/30/2020  08/29/2020  01/14/2021  07/08/2021  08/14/2021  10/03/2021  07/21/2022  10/27/2022  04/13/2023  06/08/2023  10/06/2023   O2 used Room air Room air Room air Room air Room air Room air ra ra ra ra ra ra ra ra Ra sit stand ra  Number laps completed 3 3 3 3 2  stopped at 2 die to hip pain 3 laps - no hip pain following hip surgery 3 3 3  attempted byt did  only 2 3 bu stopped at 2 3 and did all 3 3 Ddi onl 2 las Sit/stand x 10 times X 10 Sit stand x 15  Comments about pace good Moderate pace Normal, hip bothering     avg pace avg  avg avg avg Good pace    Resting Pulse Ox/HR 98% and 73/min 98% and HR 77/min 99% and HR 61/min 98% and HR 70/min 98% 98% and 75/min 97% and 67/mi 94% and 80/min 96% and HR 72 98% and 71 100% and HR 82 98% ad HR 72 100% and HR 79 97% and heart rate 74 93% and HR 83 98% and HR 91  Final Pulse Ox/HR 91% and 91/min 93% and 92.min 94% and 92/min 93% and 98/min 91%  89% and 93/,imn 88% and 94 89% ad 88.nin 88% at 2nd  lap end HR 92 86% and 96 89% and HR 91 93% and HR 87 84% and HR 104 94% and heart rate 92 87% and HR 90 92% and HR 105  Desaturated </= 88% no no no no   yes almost yes yes almst no yes no yes   Desaturated <= 3% points yes yes Yes, 5 points Yes, 5 points    Yes, 5 points Yes, 8 points Yes, 12 pints Yes 11 pot 5 pots 16 pont Yes, 3 poi yes   Got Tachycardic >/= 90/min yes yes yes yes     yes yes yes no      Symptoms at end of test none none Hip pain and very mild dyspnea  Stopped due to hip pain Moderate duyspnea with mask Mild to moderate dyspnea  Severe dyspnea Mild dyspnea Severe dyspnea Very mild dyspneax moderate No  dyspnea Mild dyspnea   Miscellaneous comments x x    No hip pain    Similar to lastime Symptoms out of proportion ? better worse         PFT     Latest Ref Rng & Units 10/01/2023    9:49 AM 06/04/2023   10:12 AM 03/08/2023   10:49 AM 12/09/2022   11:48 AM 07/21/2022    9:50 AM 01/06/2022    9:51 AM 10/03/2021    8:59 AM  ILD indicators  FVC-Pre L 1.43  P 1.44  1.45  1.53  1.54  1.58  1.48   FVC-Predicted Pre % 60  P 60  64  66  67  67  63  TLC L   3.02       TLC Predicted %   70       DLCO uncorrected ml/min/mmHg 9.73  P 10.82  8.11  9.64  8.16  8.62  8.92   DLCO UNC %Pred % 58  P 64  50  59  50  53  55   DLCO Corrected ml/min/mmHg 9.73  P 10.82  7.81  9.41  7.86  8.62  8.92   DLCO COR %Pred % 58  P 64  48  58  48  53  55     P Preliminary result      LAB RESULTS last 96 hours No results found.  LAB RESULTS last 90 days Recent Results (from the past 2160 hour(s))  Pulmonary function test     Status: None (Preliminary result)   Collection Time: 10/01/23  9:49 AM  Result Value Ref Range   FVC-Pre 1.43 L   FVC-%Pred-Pre 60 %   FEV1-Pre 1.31 L   FEV1-%Pred-Pre 73 %   FEV6-Pre 1.43 L   FEV6-%Pred-Pre 63 %   Pre FEV1/FVC ratio 92 %   FEV1FVC-%Pred-Pre 122 %   Pre FEV6/FVC Ratio 100 %   FEV6FVC-%Pred-Pre 105 %   FEF 25-75 Pre 2.18 L/sec   FEF2575-%Pred-Pre 146 %   DLCO unc 9.73 ml/min/mmHg   DLCO unc % pred 58 %   DLCO cor 9.73 ml/min/mmHg   DLCO cor % pred 58 %   DL/VA 6.29 ml/min/mmHg/L   DL/VA % pred 81 %         has a past medical history of Allergy, Arthritis, Asthma, Cataract, Coronary artery calcification seen on CAT scan (08/19/2017), DDD (degenerative disc disease), thoracic, Depression, DOE (dyspnea on exertion), Fatty liver, GERD (gastroesophageal reflux disease), H/O steroid therapy, Heart palpitations (02/28/2015), Helicobacter pylori ab+, Hemorrhoids, Hiatal hernia, High cholesterol, History of chronic bronchitis, History of migraine, History of MRSA  infection, Hyperlipidemia, mixed (08/19/2017), Hyperplastic colon polyp (2007), IBS (irritable bowel syndrome), Inguinal hernia, Insulin resistance, Interstitial lung disease (HCC), MVP (mitral valve prolapse), Pneumonia, Pneumonitis, hypersensitivity (HCC), PONV (postoperative nausea and vomiting), Pre-diabetes, Pulmonary fibrosis (HCC), PVC (premature ventricular contraction) (08/19/2017), Rapid heart rate, Squamous cell carcinoma of skin, Tinnitus, right ear, and Vocal cord ulcer.   reports that she has never smoked. She has never used smokeless tobacco.  Past Surgical History:  Procedure Laterality Date   60 HOUR PH STUDY N/A 02/21/2018   Procedure: 24 HOUR PH STUDY;  Surgeon: Napoleon Form, MD;  Location: [facility];  Service: Endoscopy;  Laterality: N/A;   BREAST BIOPSY Right 2009   BIOPSY, pt denies   BREAST BIOPSY Left 2003   Benign    CATARACT EXTRACTION Left    CESAREAN SECTION     COLONOSCOPY     ESOPHAGEAL MANOMETRY N/A 02/21/2018   Procedure: ESOPHAGEAL MANOMETRY (EM);  Surgeon: Napoleon Form, MD;  Location: [facility];  Service: Endoscopy;  Laterality: N/A;   FOOT FRACTURE SURGERY  2006 or 2007   right   HYMENECTOMY     LUNG BIOPSY  09/28/2012   Procedure: LUNG BIOPSY;  Surgeon: Loreli Slot, MD;  Location: [facility] OR;  Service: Thoracic;  Laterality: N/A;  lung biopsies tims three   SQUAMOUS CELL CARCINOMA EXCISION Left    left arm   TOTAL HIP ARTHROPLASTY Right 09/10/2015   Procedure: RIGHT TOTAL HIP ARTHROPLASTY ANTERIOR APPROACH;  Surgeon: Durene Romans, MD;  Location: [facility];  Service: Orthopedics;  Laterality: Right;  TOTAL HIP ARTHROPLASTY Left 10/03/2019   Procedure: TOTAL HIP ARTHROPLASTY ANTERIOR APPROACH;  Surgeon: Durene Romans, MD;  Location: [facility];  Service: Orthopedics;  Laterality: Left;  70 mins   TUBAL LIGATION     UPPER GI ENDOSCOPY     VIDEO ASSISTED THORACOSCOPY  09/28/2012   Procedure: VIDEO ASSISTED THORACOSCOPY;  Surgeon: Loreli Slot, MD;  Location: [facility] OR;  Service: Thoracic;  Laterality: Right;   VIDEO BRONCHOSCOPY  11/19/2011   Procedure: VIDEO BRONCHOSCOPY WITH FLUORO;  Surgeon: Kalman Shan, MD;  Location: [facility];  Service: Endoscopy;;    Allergies  Allergen Reactions   Atorvastatin Other (See Comments)    Leg pain   Betadine [Povidone Iodine] Other (See Comments)    blisters   Codeine Nausea And Vomiting and Other (See Comments)    Other reaction(s): Vomiting   Garlic Diarrhea   Hydrocodone Nausea And Vomiting and Other (See Comments)   Macrobid [Nitrofurantoin] Other (See Comments)   Ofev [Nintedanib] Other (See Comments)    Abdominal pain   Onion Diarrhea   Other Other (See Comments)   Rosuvastatin Other (See Comments)    Leg pain   Shellfish Allergy Nausea And Vomiting   Statins Other (See Comments)    "Leg pain", Other reaction(s): Other (See Comments), Other (See Comments), "Leg pain", Leg pain, Leg pain   Sulfa Antibiotics    Sulfonamide Derivatives Other (See Comments)    headaches    Immunization History  Administered Date(s) Administered   Fluad Quad(high Dose 65+) 07/17/2019   Hepatitis A 05/23/2010   Hepatitis B 06/23/2006   Influenza Split 08/11/2012, 08/14/2017   Influenza Whole 09/08/2011   Influenza, High Dose Seasonal PF 08/24/2016, 07/24/2017, 08/31/2018, 08/15/2020, 09/26/2020, 09/12/2021, 08/24/2022   Influenza,inj,Quad PF,6+ Mos 09/07/2013   Influenza-Unspecified 09/26/2014, 07/27/2015, 08/24/2023   MMR 05/23/2010   Moderna Sars-Covid-2 Vaccination 07/30/2021, 01/01/2022   PFIZER(Purple Top)SARS-COV-2 Vaccination 12/16/2019, 01/08/2020, 07/18/2020, 08/11/2022   Pneumococcal Conjugate-13 03/15/2014   Pneumococcal Polysaccharide-23 01/11/2018, 09/26/2020, 08/26/2021   Respiratory Syncytial Virus Vaccine,Recomb Aduvanted(Arexvy) 07/28/2022   Td 02/22/2003   Tdap 10/28/2012   Zoster Recombinant(Shingrix) 02/15/2017, 05/17/2017   Zoster, Live 12/04/2010     Family History  Problem Relation Age of Onset   Asthma Mother    Osteoarthritis Mother    Dementia Mother 54   Lymphoma Father    Diabetes Father    Hypertension Sister    Heart disease Sister    Kidney disease Sister    Allergic rhinitis Daughter    Ovarian cancer Maternal Aunt    Breast cancer Paternal Aunt    Breast cancer Paternal Aunt    Emphysema Maternal Grandmother    Asthma Maternal Grandmother    Lung disease Maternal Grandfather    Bone cancer Paternal Grandfather    Breast cancer Cousin    Breast cancer Cousin    Breast cancer Cousin    Colon cancer Neg Hx    Angioedema Neg Hx    Eczema Neg Hx    Immunodeficiency Neg Hx    Urticaria Neg Hx      Current Outpatient Medications:    acetaminophen (TYLENOL) 500 MG tablet, Tylenol Extra Strength, Disp: , Rfl:    albuterol (PROAIR HFA) 108 (90 Base) MCG/ACT inhaler, Inhale 2 puffs into the lungs every 4 (four) hours as needed for wheezing. Or coughing spells.  You may use 2 Puffs 5-10 minutes before exercise., Disp: 1 Inhaler, Rfl: 3   Alpha-D-Galactosidase (BEANO PO), Take 1 tablet by mouth as  needed (if eating onion or garlic). , Disp: , Rfl:    AMBULATORY NON FORMULARY MEDICATION, Allergy injections Once a week, Disp: , Rfl:    Ascorbic Acid (VITAMIN C) 1000 MG tablet, Take 1,000 mg by mouth daily., Disp: , Rfl:    ASHWAGANDHA PO, Take 2 capsules by mouth daily., Disp: , Rfl:    Calcium Carb-Cholecalciferol (CALCIUM 500 + D PO), Take 2 tablets by mouth daily at 6 (six) AM., Disp: , Rfl:    chlorpheniramine (CHLOR-TRIMETON) 4 MG tablet, Take 2 mg by mouth 2 (two) times daily. Takes half tablet twice daily, Disp: , Rfl:    cholecalciferol (VITAMIN D3) 25 MCG (1000 UNIT) tablet, Take 1,000 Units by mouth every other day., Disp: , Rfl:    ciclopirox (PENLAC) 8 % solution, See admin instructions., Disp: , Rfl:    dicyclomine (BENTYL) 10 MG capsule, Take 1 capsule by mouth daily as needed., Disp: , Rfl:    diltiazem  (CARDIZEM CD) 120 MG 24 hr capsule, Take 1 capsule (120 mg total) by mouth daily., Disp: 90 capsule, Rfl: 1   diphenhydrAMINE-PE-APAP (DELSYM COUGH/COLD NIGHT TIME) 12.5-5-325 MG/10ML LIQD, Take by mouth as needed., Disp: , Rfl:    EPINEPHrine 0.3 mg/0.3 mL IJ SOAJ injection, Inject into the muscle., Disp: , Rfl:    ezetimibe (ZETIA) 10 MG tablet, Take 1 tablet (10 mg total) by mouth daily. Please keep upcoming appt for future refills. Thank you., Disp: 90 tablet, Rfl: 3   fluticasone (FLONASE) 50 MCG/ACT nasal spray, Spray 2 sprays into each nostril every day., Disp: 48 g, Rfl: 1   fluticasone furoate-vilanterol (BREO ELLIPTA) 100-25 MCG/ACT AEPB, Inhale 1 puff into the lungs daily., Disp: 180 each, Rfl: 3   glimepiride (AMARYL) 1 MG tablet, Take 1 mg by mouth every morning., Disp: , Rfl:    lactase (LACTAID) 3000 units tablet, Take by mouth., Disp: , Rfl:    levocetirizine (XYZAL) 5 MG tablet, Take 5 mg by mouth at bedtime., Disp: , Rfl:    metFORMIN (GLUCOPHAGE-XR) 500 MG 24 hr tablet, Take 500 mg by mouth 2 (two) times daily with a meal., Disp: , Rfl:    metoprolol succinate (TOPROL-XL) 25 MG 24 hr tablet, Take 1 tablet (25 mg total) by mouth daily., Disp: 90 tablet, Rfl: 3   montelukast (SINGULAIR) 10 MG tablet, TAKE 1 TABLET BY MOUTH EVERYDAY AT BEDTIME, Disp: 90 tablet, Rfl: 2   Multiple Vitamin (MULTIVITAMIN) tablet, Take 1 tablet by mouth daily., Disp: , Rfl:    nitroGLYCERIN (NITROSTAT) 0.4 MG SL tablet, Place 1 tablet (0.4 mg total) under the tongue every 5 (five) minutes as needed for chest pain., Disp: 25 tablet, Rfl: 12   nystatin-triamcinolone (MYCOLOG II) cream, nystatin-triamcinolone 100,000 unit/g-0.1 % topical cream, Disp: , Rfl:    omeprazole (PRILOSEC OTC) 20 MG tablet, Take 10 mg by mouth daily., Disp: , Rfl:    OZEMPIC, 0.25 OR 0.5 MG/DOSE, 2 MG/3ML SOPN, Inject 0.25 mg into the skin once a week., Disp: , Rfl:    pravastatin (PRAVACHOL) 40 MG tablet, Take 1 tablet (40 mg  total) by mouth every evening., Disp: 90 tablet, Rfl: 1   predniSONE (DELTASONE) 10 MG tablet, Take 1 tablet (10 mg total) by mouth daily with breakfast. (Patient taking differently: Take 20 mg by mouth daily with breakfast.), Disp: 30 tablet, Rfl: 2   Sodium Chloride, Inhalant, 7 % NEBU, Use per nebulizer once a day to help clear sputum, Disp: 120 mL, Rfl: 3  OXYGEN, Inhale into the lungs. Up to 6 liters as needed during the day and nite time use on/off (Patient not taking: Reported on 10/06/2023), Disp: , Rfl:   Current Facility-Administered Medications:    methylPREDNISolone acetate (DEPO-MEDROL) injection (RADIOLOGY ONLY) 120 mg, 120 mg, Intramuscular, Once, Omar Person, MD      Objective:   Vitals:   10/06/23 1601  BP: 116/60  Pulse: 91  SpO2: 98%  Weight: 138 lb 12.8 oz (63 kg)  Height: 5' (1.524 m)    Estimated body mass index is 27.11 kg/m as calculated from the following:   Height as of this encounter: 5' (1.524 m).   Weight as of this encounter: 138 lb 12.8 oz (63 kg).  @WEIGHTCHANGE @  Filed Weights   10/06/23 1601  Weight: 138 lb 12.8 oz (63 kg)     Physical Exam   General: No distress. Look swell O2 at rest: no Cane present: no Sitting in wheel chair: no Frail: no Obese: no Neuro: Alert and Oriented x 3. GCS 15. Speech normal Psych: Pleasant Resp:  Barrel Chest - no.  Wheeze - yes, Crackles - yes, No overt respiratory distress CVS: Normal heart sounds. Murmurs - no Ext: Stigmata of Connective Tissue Disease - no HEENT: Normal upper airway. PEERL +. No post nasal drip        Assessment:       ICD-10-CM   1. Hypersensitivity pneumonitis (HCC)  J67.9     2. ILD (interstitial lung disease) (HCC)  J84.9     3. High risk medication use  Z79.899     4. Immunosuppression due to drug therapy [facility])  D84.821    Z79.899          Plan:     Patient Instructions    Hypersensitivity pneumonitis (HCC) ILD (interstitial lung disease)  (HCC) Current chronic use of systemic steroids  - ILD more stable with higher dose of steroids but with caveat of side effect profile  Plan - Cotninue prednisone  20 mg daily  - Continue to use oxygen as needed for exertional hypoxemia - Ok to hold off right heart catheterization  Continue to control acid reflux well - discussed bactrim prophylaxis; will hold off for now  Tachycardia  Plan  - check with Dr  Antoine Poche if IVABRADINE is a better fit for inappropriate fast heart rate    Followup - spirometry and dlco in 6 months  - 15 min visit in 6 mmoths with DR Marchelle Gearing   FOLLOWUP Return in about 6 months (around 04/04/2024) for after Cleda Daub and DLCO, 15 min visit, ILD, with Dr Marchelle Gearing.    SIGNATURE    Dr. Kalman Shan, M.D., F.C.C.P,  Pulmonary and Critical Care Medicine Staff Physician, [facility] Director - Interstitial Lung Disease  Program  Pulmonary Fibrosis [facility] at [facility] [place], [place], [address]  Pager: [phone], If no answer or between  15:00h - 7:00h: call [phone] Telephone: [phone]  4:41 PM 10/06/2023

## 2023-10-20 ENCOUNTER — Encounter: Payer: Self-pay | Admitting: Cardiology

## 2023-10-27 ENCOUNTER — Encounter: Payer: Self-pay | Admitting: Internal Medicine

## 2023-11-03 LAB — LAB REPORT - SCANNED
A1c: 6.9
EGFR: 48

## 2023-11-24 DIAGNOSIS — D72829 Elevated white blood cell count, unspecified: Secondary | ICD-10-CM

## 2023-11-24 DIAGNOSIS — K859 Acute pancreatitis without necrosis or infection, unspecified: Secondary | ICD-10-CM

## 2023-11-24 HISTORY — DX: Elevated white blood cell count, unspecified: D72.829

## 2023-11-24 HISTORY — DX: Acute pancreatitis without necrosis or infection, unspecified: K85.90

## 2023-12-02 ENCOUNTER — Ambulatory Visit: Payer: Medicare PPO | Admitting: Internal Medicine

## 2023-12-11 ENCOUNTER — Other Ambulatory Visit: Payer: Self-pay | Admitting: Internal Medicine

## 2024-01-04 LAB — LAB REPORT - SCANNED
A1c: 7.7
EGFR: 55
TSH: 0.92 (ref 0.41–5.90)

## 2024-01-23 NOTE — Progress Notes (Unsigned)
  Cardiology Office Note:   Date:  01/25/2024  ID:  Rhonda [name], DOB 01-Feb-1951, MRN [id number] PCP: Dois Davenport, MD  [facility] Providers Cardiologist:  Rollene Rotunda, MD {  History of Present Illness:   Rhonda [name] is a 73 y.o. female who presents for follow up of coronary calcium and PVCs.  She had a normal nuclear stress test in 2020.  She has premature ventricular contractions.  She had a normal echo in   She has chronic interstitial lung disease.  She does have home O2.  Her calcium score was 343.    She had a normal echo in Nov 2022.     She returns for follow up.  She has had increasing problems with interstitial lung disease and currently is on 20 mg of prednisone a day.  She has backed herself down a little bit and is working with Dr. Marchelle Gearing for management of this.  She was going to get a right heart cath as part of her clinical trial but she decided not to do this.  I do see echocardiograms from last year that do not demonstrate RV dysfunction there is only minimally elevated pulmonary pressures.  She denies chest pressure, neck or arm discomfort.  She is not having any of palpitations which she was having.  She is not having any PND or orthopnea.  She has had no leg swelling.  ROS: As stated in the HPI and negative for all other systems.  Studies Reviewed:    EKG:   EKG Interpretation Date/Time:  Tuesday January 25 2024 16:08:11 EST Ventricular Rate:  77 PR Interval:  124 QRS Duration:  82 QT Interval:  338 QTC Calculation: 382 R Axis:   -12  Text Interpretation: Normal sinus rhythm Minimal voltage criteria for LVH, may be normal variant ( R in aVL ) Nonspecific ST and T wave abnormality When compared with ECG of 24-Dec-2021 15:21, No significant change since last tracing Confirmed by Rollene Rotunda ([id number]) on 01/25/2024 4:29:06 PM    Risk Assessment/Calculations:              Physical Exam:   VS:  BP 122/70 (BP Location: Left Arm, Patient  Position: Sitting, Cuff Size: Normal)   Pulse 77   Ht 5' (1.524 m)   Wt 138 lb 9.6 oz (62.9 kg)   SpO2 92%   BMI 27.07 kg/m    Wt Readings from Last 3 Encounters:  01/25/24 138 lb 9.6 oz (62.9 kg)  10/06/23 138 lb 12.8 oz (63 kg)  10/01/23 137 lb 9.6 oz (62.4 kg)     GEN: Well nourished, well developed in no acute distress NECK: No JVD; No carotid bruits CARDIAC: RRR, no murmurs, rubs, gallops RESPIRATORY:  Clear to auscultation without rales, wheezing or rhonchi  ABDOMEN: Soft, non-tender, non-distended EXTREMITIES:  No edema; No deformity   ASSESSMENT AND PLAN:   Coronary artery calcifications on CT:   The patient does not have any new chest discomfort.  She had a negative perfusion study in 2020.  No change in therapy.  Palpitations: She has PACs.  She does not feel her palpitations.  She is doing well on the Cardizem.  No change in therapy.   Dyslipidemia:    LDL was 61 with and HDL of 76.  No change in therapy.    Follow up with me in one year.   Signed, Rollene Rotunda, MD

## 2024-01-25 ENCOUNTER — Ambulatory Visit: Payer: Medicare PPO | Attending: Cardiology | Admitting: Cardiology

## 2024-01-25 ENCOUNTER — Encounter: Payer: Self-pay | Admitting: Internal Medicine

## 2024-01-25 ENCOUNTER — Encounter: Payer: Self-pay | Admitting: Cardiology

## 2024-01-25 VITALS — BP 122/70 | HR 77 | Ht 60.0 in | Wt 138.6 lb

## 2024-01-25 DIAGNOSIS — I2510 Atherosclerotic heart disease of native coronary artery without angina pectoris: Secondary | ICD-10-CM | POA: Diagnosis not present

## 2024-01-25 DIAGNOSIS — R002 Palpitations: Secondary | ICD-10-CM | POA: Diagnosis not present

## 2024-01-25 DIAGNOSIS — E785 Hyperlipidemia, unspecified: Secondary | ICD-10-CM | POA: Diagnosis not present

## 2024-01-25 NOTE — Patient Instructions (Signed)
 Medication Instructions:  No changes.  *If you need a refill on your cardiac medications before your next appointment, please call your pharmacy*   Follow-Up: At [facility], you and your health needs are our priority.  As part of our continuing mission to provide you with exceptional heart care, we have created designated Provider Care Teams.  These Care Teams include your primary Cardiologist (physician) and Advanced Practice Providers (APPs -  Physician Assistants and Nurse Practitioners) who all work together to provide you with the care you need, when you need it.  We recommend signing up for the patient portal called "MyChart".  Sign up information is provided on this After Visit Summary.  MyChart is used to connect with patients for Virtual Visits (Telemedicine).  Patients are able to view lab/test results, encounter notes, upcoming appointments, etc.  Non-urgent messages can be sent to your provider as well.   To learn more about what you can do with MyChart, go to [email or web address].    Your next appointment:   1 year(s)  Provider:   Rollene Rotunda, MD     Other Instructions

## 2024-01-26 NOTE — Telephone Encounter (Signed)
 Patient asking for a refill on prednisone and asking if she should stay at lower dose due to sugars. Please advise. Does she need an appt to discuss?

## 2024-01-26 NOTE — Telephone Encounter (Signed)
 I think you should consider cellcept as a way to reduce steroids.  So we are not dealing with sugar and bone issues of steroids. Takes 6- weeks to kick in. Likely wil have to accept downside of cellcept which is immune suppression but ni practice has been largely ok. Diarreha reported but in practice not seen much

## 2024-01-27 ENCOUNTER — Telehealth: Admitting: Internal Medicine

## 2024-01-27 NOTE — Telephone Encounter (Signed)
 Spoke with the pt  She has virtual ov with MR 01/31/24 and will discuss this then  Nothing further needed

## 2024-01-31 ENCOUNTER — Telehealth: Payer: Self-pay | Admitting: Internal Medicine

## 2024-01-31 ENCOUNTER — Telehealth (INDEPENDENT_AMBULATORY_CARE_PROVIDER_SITE_OTHER): Admitting: Internal Medicine

## 2024-01-31 DIAGNOSIS — D84821 Immunodeficiency due to drugs: Secondary | ICD-10-CM

## 2024-01-31 DIAGNOSIS — Z111 Encounter for screening for respiratory tuberculosis: Secondary | ICD-10-CM

## 2024-01-31 DIAGNOSIS — J849 Interstitial pulmonary disease, unspecified: Secondary | ICD-10-CM

## 2024-01-31 DIAGNOSIS — Z7952 Long term (current) use of systemic steroids: Secondary | ICD-10-CM

## 2024-01-31 DIAGNOSIS — R739 Hyperglycemia, unspecified: Secondary | ICD-10-CM | POA: Diagnosis not present

## 2024-01-31 DIAGNOSIS — J679 Hypersensitivity pneumonitis due to unspecified organic dust: Secondary | ICD-10-CM | POA: Diagnosis not present

## 2024-01-31 DIAGNOSIS — Z79899 Other long term (current) drug therapy: Secondary | ICD-10-CM

## 2024-01-31 NOTE — Progress Notes (Signed)
 2012 and 2013  #GE reflux with small hiatal hernia  - on ppi   #History of rapid heart rate not otherwise specified  - Start her on Lopressor  2008/2009 by primary care physician. History appears to correlate with onset of respiratory issues  - refuses to dc this drug as of 2014 discussion   # Hypersensitivity Pneumonitis and ILD  - Potential etiologies - cockateil x 2 for 18 years through end 2012. 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 Denies mold but lives in house built in [place] with a "weird baselment: and has humidifier  - first noted on CT chest 10/15/09 following trip to [place] (PE negative) but not described in 2003 CT chest report  - autoimmune panel: 10/23/11: Negative  -  Uderwennt bronch 11/19/11  - non diagnostic  - VATS Nov 2013 (done after initially refusing)- CRONIC HYPERSENSITIVITY PNEUMONITIS WITH GRANULOMA. However, there is worrying trend of UIP pattern in the Upper lobes.         OV 06/17/2017  Chief Complaint  Patient presents with   Follow-up    [name]. Pt denies significant cough, denies CP/tightness, f/c/s.     She is better. Feels she does not need o2. Has been to [facility] for transplant clinic; felt too early. Seems higher dose prednisone helping but then she is also bettter t his time of the year. In May 2018 I was at [facility] and curbsided national thought leaders - feel that we could explore silent active GERD as a possibility of ILD getting wors with time.   OV 01/11/2018  Chief Complaint  Patient presents with   Follow-up    PFT done today.  Pt states she has been coughing x2 weeks since she returned from a trip to [place]. From coughing, pt has had pain in ribs. Pt states she has mild SOB which is stable due to rehab.  Pt states that Dr. Hal Hope is wanting to know if pt could possibly have eosinophilic esophagitis.   Went to [place]. Returned  and has been coughing x 2 weeks. Associated with this is some rib pain on right side. She feels she does not need o2. She feels she does not need pred/abx at this time. She also exposed to flu wit family   OV 02/08/2018  Chief Complaint  Patient presents with   Acute Visit    CXR done 02/07/18.  Pt states her throat is sore but not as bad as it was yesterday when in office and does have some left ear pain. Pt states she has some pain on right rib cage and has increased fatigue.     Rhonda [name] presents acutely.  I last saw her one month ago gave her Tamiflu for prophylaxis after flu exposure.  At that point in time she was already reducing her chronic stable prednisone of 10 mg for interstitial lung disease/hypersensitivity pneumonitis.  She had tapered herself slowly 3 or 4 weeks ago to 5 mg.  She says she was doing fairly okay but in the last 2 or 3 weeks she has had increased fatigue.  She says she goes daily swimming and then feels energized but then an hour later starts having fatigue which is more than usual.  Also in the daytime she has random fatigue.  In terms of her respiratory symptoms these are stable and in fact slightly better in terms  of shortness of breath and cough but she is having sore throat without any fever and some left-sided otalgia.  In addition she is having right-sided chest pains that are more than usual.  The chest pains have been reported before.  These are specifically at localized spots along the previous incision several years ago for interstitial lung disease.  They are tender as well to touch.  This pain is worse.  In addition she is also being bothered by arthralgia in her hand and left hip.  The left hip arthralgias old with a hand arthralgias new.  She is worried about pneumothorax recurrence and so she had a chest x-ray yesterday that shows no pneumothorax.  She has stable interstitial lung disease changes but in my personal visualization these interstitial lung  disease changes are worse compared to several years ago.  We do know that she has slowly worsening interstitial lung disease.  In terms of a walking desaturation test this shows stability since last visit.  She has seen GI for acid reflux and has pH probe study coming up  In talking to her she tells me she has been on metformin for over a month or 2.  This is new and is meant for pre-diabetes.  Apparently her hemoglobin A1c is always below 6 but is above 5.5.  Walking desaturation test on 02/08/2018 185 feet x 3 laps on ROOM AIR:  did walk normal pace with forehead probe desaturate. Rest pulse ox was 99%, final pulse ox was 93%. HR response 72/min at rest to 93/min at peak exertion. [name]  Did not Desaturate < 88% . Rhonda [name] yes did  Desaturated </= 3% points. Rhonda [name] yes did get tachyardic   Dg Chest 2 View  Result Date: 02/07/2018 CLINICAL DATA:  Chest wall pain for 2 weeks EXAM: CHEST - 2 VIEW COMPARISON:  CT 08/20/2017, radiograph 10/11/2015 FINDINGS: Coarse interstitial pattern consistent with pulmonary fibrosis, similar distribution compared to prior. No focal pulmonary opacity or pleural effusion. Stable cardiomediastinal silhouette with aortic atherosclerosis. No pneumothorax. Degenerative changes of the spine. IMPRESSION: No active cardiopulmonary disease. Similar appearance of diffuse pulmonary fibrosis. Negative for a pneumothorax. Electronically Signed   By: Jasmine Pang M.D.   On: 02/07/2018 14:26    OV 04/13/2018  Chief Complaint  Patient presents with   Follow-up    Pt had pre spiro and DLCO PFT prior to OV. Pt has increase of productive cough-thick white in last week. Pt has some wheezing, and allergy issues.   Rhonda [name] presents for routine follow-up.  I saw her acutely just approximately 2 months ago.  Then end of April 2019 she saw a nurse practitioner again acutely and was given antibiotic and prednisone.  She says she felt better after  that but in the last week or so due to increased pollen load in the community and also moving furniture because her daughter is relocating out of her home she started having more cough.  There is no change in her dyspnea with the cough is really bad.  This is despite Singulair and Chlor-Trimeton.  Those things to help but just take the edge off.  She feels that the pollen making the cough worse.  There is no change in dyspnea.  Walking desaturation test documented below his baseline.  She had pulmonary function test today but the [facility] shows significant decline compared to February 2019 and she feels surprised by this.  DLCO is baseline unchanged and a  walking desaturation test limited in the office is also unchanged.  She does not have any fever but has significant postnasal drip.  She has not followed up at [facility] transplant clinic or the allergist recently.  OV 08/16/2018  Subjective:  Patient ID: Rhonda [name], female , DOB: 08-24-1951 , age 63 y.o. , MRN: [id number] , ADDRESS: [address] [place] [address]   08/16/2018 -   Chief Complaint  Patient presents with   Follow-up    PFT performed today.  Pt states she has had a lot of mucus the month of September 2019 so she has been taking the Chlor-Trimeton. States other than that she has been able to do a lot more activities and has been swimming a lot more. Pt states she believes the SOB is stable.     HPI Rhonda [name] 73 y.o. -continues to do well.  She is taking higher dose of prednisone 10 mg/day.  Also the summer usually she is doing well.  She is not having much of a cough.  Lung function test shows improvement compared to tests done earlier this year.  In fact she is almost as good as much 2018.  Still compared to 2014-2016 she has progressive lung disease.  Walking desaturation test is stable.  She needs a high-dose flu shot but we do not have stock today.  She has lost some weight intentionally with better dietary  control.  She is exercising regularly swimming.  We discussed ofev  potential future therapy if the drug is approved for this indication.  We will know study results in a year or so.  She is open to the idea but is worried somewhat about her irritable bowel syndrome flaring up.  She is no longer following at the [facility] lung transplant clinic given stability lung function  Other issues: She always has right-sided chest wall pain to the site of lung biopsy.  The only way this is been resolved as by her limiting her use of bra .  Also her irritable bowel syndrome is under control but in case it flares up she wants a refill of dicyclomine     OV 12/15/2018  Subjective:  Patient ID: Rhonda [name], female , DOB: Jul 13, 1951 , age 29 y.o. , MRN: [id number] , AD[address]ESS: [address] [place] [address]   12/15/2018 -   Chief Complaint  Patient presents with   Follow-up    Pt states she has been doing better since last visit with TP but states she has been having pain in her rib cage x2 years but states it has become more intense.     HPI Rhonda [name] 73 y.o. -returns for follow-up of her ILD due to hypersensitivity pneumonitis.  She did a clinic visit also for research with the ILD-pro registry study.  Since I last saw her in September 2019 she had a flareup and required higher doses of prednisone.  Since then she is returned to baseline of 5 mg prednisone and she feels good.  She is stable.  Walking desaturation test shows stability.  We have been discussing over the last few months about taking nintedanib based on new data and progressive non--IPF ILD's.  She has trepidation for this because of irritable bowel syndrome.  After much discussion she is willing to try 100 mg once daily for a month and then escalate to 100 mg twice daily which is the therapeutic dose.  She will do this once after insurance  approval which we suspect will happen over the next few months.  Her main issue is  that her right sided infra-axillary chest pain is getting worse.  This started after her surgical lung biopsy and pneumothorax on the right side.  It is random and happens with twisting.  But now days it happens once every few days.  Previously it used to happen once every few weeks.  Sometimes it is excruciating but then she is left in pain for a few days.  Applying Abrol sudden twisting of her body makes it worse.  Is a lancinating pain.  She did try gabapentin some years ago for chronic cough .  She did tolerate the gabapentin well.  She does not remember if it helped the pain or not but clearly back and the pain was much less intense.  She is willing to try this again.  I recommended a CT scan of the chest but she is going to have radiation for a CT coronary angiogram because of high levels of coronary artery calcification.  Therefore written to the radiologist to see if he can look at the lung parenchyma with a CT angiogram    OV 05/09/2019  Subjective:  Patient ID: Rhonda [name], female , DOB: [birth year] , age 81 y.o. , MRN: [id number] , ADDRESS: [address] [place] [address]   05/09/2019 -   Chief Complaint  Patient presents with   ILD (Interstitial Lung Disease)     HPI Rhonda [name] 73 y.o. -follow-up for chronic care physician pneumonitis on daily 5 mg prednisone.  She has problems with right-sided chest wall pain that is neuropathic.  She tells me since her last visit January 2020 she has been isolating and social distancing because of the pandemic.  Overall she is been stable.  Her symptom scores are mild and documented below.  She continues with prednisone.  She was supposed to have pulmonary function test but this was held because of the pandemic.  Her walking desaturation test shows she is stable.  She is supposed to have pulmonary function test tomorrow but wanted to see me today.  In the interim she did have a cardiac CT scan of the chest that shows 94th percentile of  coronary calcium.  She could not get a CT angiogram done because of PVC.  A nuclear medicine stress test was done and I reviewed this result and it was normal.  A Holter test is pending.  She is kind of nervous because of the ongoing COVID-19 situation and safety of doing a Holter test.  In terms of her right chest wall pain she is adapted to it.  She does not use any bra  OV 08/22/2019  Subjective:  Patient ID: Rhonda [name], female , DOB: 1951-05-27 , age 9 y.o. , MRN: [id number] , ADDRESS: [address] [place] [address]   08/22/2019 -   Chief Complaint  Patient presents with   Follow-up    needs sx clearance for L hip replacement- not yet scheduled, pending clearance.     Patient lung disease chronic hypersensitive pneumonitis on chronic daily prednisone 5 mg/day  HPI Rhonda [name] 73 y.o. -presents for follow-up of her interstitial lung disease due to chronic hypersensitive pneumonitis.  After last visit in June 2020 given her PFT stability and also her aversion to the potential side effects with nintedanib she decided to just continue with prednisone daily 5 mg/day.  Given the pandemic she is walking 1 mile a  day without stopping.  Her interstitial lung disease symptom score is actually slightly better.  Overall she reports stability in her interstitial lung disease.  She has a new issue of getting preoperative clearance for her left hip.  She says the left hip is bone-on-bone and she does not want to take Aleve.  She prefers to have surgery.  Dr. Durene Romans is the surgeon.  Surgery date has not been set.  She is very functional.  She has normal renal function.  Normal nutritional status.  No anemia.  No respiratory infections in the last 1 month.  She is not on oxygen.  Age 16.  The surgeries in the left hip and the duration of surgery is under 3 hours.  Anesthesia will be general.      OV 01/30/2020  Subjective:  Patient ID: Rhonda [name], female , DOB: 1951-04-07 ,  age 21 y.o. , MRN: [id number] , ADDRESS: [address] [place] [address] Patient lung disease chronic hypersensitive pneumonitis on chronic daily prednisone 5 mg/day  01/30/2020 -   Chief Complaint  Patient presents with   Follow-up    PFT performed 3/8.  Pt states she believes she has been going downhill for the past 2 months. Pt was started on O2 at last OV and wears it prn. Pt does have an occ cough.     HPI Rhonda [name] 73 y.o. -presents for follow-up of interstitial lung disease secondary to chronic hypersensitive pneumonitis.  She maintains herself on prednisone 5 mg/day.  In the interim she has had hip surgery and this did wonders for her.  She is able to walk longer distances.  However she does notice that her dyspnea has declined.  She says since January 2021 his symptoms have declined particularly with cough.  Usually in the winter she goes through a cycle of severe cough and exacerbation that requires higher levels of prednisone.  She has been social distancing well and masking well.  She states there is absolutely no mold or water any antigen exposure at home.  But she feels it is her allergies acting up and making her more symptomatic.  She has had a Covid vaccine and is wondering about either going back to swimming or starting pulmonary rehabilitation.  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  In fact when we walked her today with the mask she did drop down to 89%.  This is a decline for her.  She had pulmonary function test and this shows significant decline in FVC and DLCO.  In review of this that.  Where she has declined this much but is always bounce back with steroids.  At last visit we discussed nintedanib for her as a way to prevent progression of her ILD but given her concerns of GI side effects and the presence of irritable bowel syndrome she is declined.  Infective have this  conversation with her including her joining the [employer] at [facility] but she has always been worried about the GI side effects with nintedanib.  This time she is more open to it because of the decline in lung function.  She wants to see an allergist  She also told me that she continues to have a restrictive chest pain particularly on the right side lower area.  She no longer wears a brassiere it has been nearly 3 years since she had a high-resolution  CT chest.  She is open to having one now.  Last liver function test was normal in January 2020.  Last renal function was normal in November 2020.      OV 03/19/2020  Subjective:  Patient ID: Rhonda [name], female , DOB: 1951/02/20 , age 21 y.o. , MRN: [id number] , ADDRESS: [address] [place] [address]  Chronic hypersensitive pneumonitis interstitial lung disease on prednisone daily 10 mg 03/19/2020 -   Chief Complaint  Patient presents with   Follow-up    Pt states she is doing better since last visit and states her breathing has also improved.     HPI Rhonda [name] 73 y.o. -at last visit in March 2021 there was decline in lung function.  Therefore we started nintedanib.  She took 1 tablet of 150 mg and then had immediate abdominal pain and fever.  Therefore she is decided against taking this.  I also support this decision because I do not think she will tolerate nintedanib.  We did extensive interstitial lung disease question a history again at that time.  She had taken 1 dose of nitrofurantoin.  Have indicated to her that she should never take this medicine again.  In addition discovered that she might have some feather jackets at home she has now gotten rid of it.  She is now doing pulmonary rehabilitation.  Her overnight oxygen desaturation test is negative.  At pulmonary rehabilitation she tells me she does not drop below 91% and 1 time she went to 89%.  Otherwise overall doing great.  Her cardiologist is increased her Lopressor.   Her daughter is getting married and she is busy with the wedding planning this is being emotionally stressful for her.  Her allergist has increased her recommendations and this is also helping her.  Her allergist is retiring and she wanted recommendations for new allergist.  I have recommended Dr. Madie Reno and Dr. Gary Fleet.  Incidentally that right-sided chest pain that she had is also better with increased prednisone of 10 mg/day.  She feels prednisone is working really well for her.    OV 08/29/2020   Subjective:  Patient ID: Rhonda [name], female , DOB: 10-Mar-1951, age 30 y.o. years. , MRN: [id number],  ADDRESS: [address] [place] [address] PCP  Dois Davenport, MD Providers : Treatment Team:  Attending Provider: Kalman Shan, MD Patient Care Team: Dois Davenport, MD as PCP - General (Family Medicine) Rollene Rotunda, MD as PCP - Cardiology (Cardiology) Kalman Shan, MD as Consulting Physician (Pulmonary Disease) Daleen Squibb, Jesse Sans, MD (Inactive) (Cardiology) Linna Darner, RD as Dietitian (Family Medicine) Rollene Rotunda, MD as Consulting Physician (Cardiology)  Follow-up chronic hypersensitive pneumonitis on chronic prednisone 10 mg/day.  Last high-resolution CT chest March 2021.  Intolerant to nintedanib.  Chief Complaint  Patient presents with   Follow-up    Pt states she had been doing well since last visit up until 3-4 weeks ago and started having problems with her breathing and has had to use her O2 with activities. Pt also has been coughing a lot and is getting up clear phlegm. Pt also has had some mild wheeze.       HPI Rhonda [name] 73 y.o. -returns for follow-up.  Since I last saw her her daughter is now married.  She had a great wedding.  She was able to enjoy this and dance quite well.  She barely used her oxygen her effort tolerance was good.  Some 3  [place].  Apparently she had difficulty  getting her oxygen tank to clinic to the electrical port.  She noticed with exertion a pulse ox dropping to the 70s.  She said even before she left a few days prior to that she started having increasing cough.  She notices that these cough cycles get exacerbated in the fall in the winter.  Looking back at her pulmonary function test there is always a dip between October and March each year.  Between 2015 and 2017 there was a decline and since then there is a waxing and waning quality with dips in the winter in the fall season.  Each time she responds to prednisone.  Most recent pulmonary function test in August 2021 is actually improved compared to March 2021.  However today she is feeling worse.  She feels something is in her airway.  She feels dry air in the house and the animals bringing leaves from outside is contributing.  She says she feels better upstairs than downstairs where the dogs.  She again denies any mold or mildew.  Denies any feather jacket exposure.  She feels she will benefit from another round of prednisone.  She feels in the past antibiotics have not helped her as much as prednisone.   In terms of chronic ILD HP: She not tolerate nintedanib in the past.  I explained to her that I am concerned about progression and her decline in functional quality and the risk for that.  We discussed pirfenidone as an alternative.  Discussed the fact is less well studied although have no reason to see why it would not be effective.  Explained the side effect profile.  She is willing to try this.  We decided to go donor samples from the patient support group  Oxygen qualification: We walked her and she qualified for oxygen today.  Is for portable oxygen with [employer]  Other issue: Spring allergies: -Her allergist is retired.  We discussed options of referral.  Referred her to Dr. Aris Georgia referred to     OV 01/14/2021  Subjective:  Patient ID: Rhonda [name], female , DOB: Mar 29, 1951 , age 1 y.o. ,  MRN: [id number] , ADDRESS: [address] [place] [address] PCP Dois Davenport, MD Patient Care Team: Dois Davenport, MD as PCP - General (Family Medicine) Rollene Rotunda, MD as PCP - Cardiology (Cardiology) Kalman Shan, MD as Consulting Physician (Pulmonary Disease) Daleen Squibb, Jesse Sans, MD (Inactive) (Cardiology) Linna Darner, RD as Dietitian (Family Medicine) Rollene Rotunda, MD as Consulting Physician (Cardiology)  This Provider for this visit: Treatment Team:  Attending Provider: Kalman Shan, MD    01/14/2021 -   Chief Complaint  Patient presents with   Follow-up    Nauseous, loss of appetite    Follow-up chronic hypersensitive pneumonitis on prednisone 5 mg/day and also pirfenidone submaximal dose of 2 pills 3 times daily since October 2021.  On ILD-pro registry protocol  - failed ofev   - failed esbrit due to side effects Larrie Kass 2022  She is also on Breo  HPI Rhonda [name] 73 y.o. -returns for follow-up.  She is now on pirfenidone along with low-dose prednisone.  She feels the pirfenidone is not reacting well with her at all.  She has intermittent loss of taste at least twice a week.  She has loss of appetite and she has forces herself to eat.  She also has fatigue.  She says that if I strongly recommended then she will continue to soldier on with it.  Otherwise she feels less short of breath.  Pulmonary function test reviewed and is actually stable/slightly improved.  She says the cough is extremely well controlled except today after the pulmonary function test she was coughing quite a bit.  She is looking forward to going back to swimming.  She has had a Covid vaccine in booster and also status post EVUSHELD monoclonal antibody prophylaxis.  We discussed the possibility about possibility of enrolling in inhaled nitric oxide device/administration study.  This is to see if people can improve in the shortness of breath scale and also physical activity.  She took the  consent form and is deliberating.  She thinks she might find the study and inconvenience to her lifestyle where she is wanting to be more active.  We went over several details of this research protocol  She has a new symptom of like itching sensation deep in the right flank.  She feels it is not the skin it is from deep inside.  She is wondering if it could be related to pirfenidone.  Started after going on pirfenidone.  Explained to her that I am not seeing this in other patients with pirfenidone but will have to be open minded.  She does not apply Vaseline to her skin in the winter.  We discussed the possibility this could be dry skin related but she does not have other features of dry skin.  Nevertheless she is willing to try Vaseline.   Results for CHAPP   OV 07/08/2021  Subjective:  Patient ID: Rhonda [name], female , DOB: 04-16-1951 , age 35 y.o. , MRN: [id number] , ADDRESS: [address] [place] [address] PCP Dois Davenport, MD Patient Care Team: Dois Davenport, MD as PCP - General (Family Medicine) Rollene Rotunda, MD as PCP - Cardiology (Cardiology) Kalman Shan, MD as Consulting Physician (Pulmonary Disease) Daleen Squibb, Jesse Sans, MD (Inactive) (Cardiology) Linna Darner, RD as Dietitian (Family Medicine) Rollene Rotunda, MD as Consulting Physician (Cardiology)  This Provider for this visit: Treatment Team:  Attending Provider: Kalman Shan, MD    07/08/2021 -  ACUTE VISIT Chief Complaint  Patient presents with   Acute Visit    Pt states that she did start taking zpak yesterday 8/15 and said her cough is doing some better after being on second day of abx and said that she started prednisone today 8/16. States that she still has increased SOB. Pt did take a covid test which came back negative.   Follow-up chronic hypersensitive pneumonitis on prednisone 5 mg/day and also pirfenidone submaximal dose of 2 pills 3 times daily since October 2021.  On ILD-pro registry  protocol - failed ofev   - failed esbrit due to side effects Larrie Kass 2022  She is also on Breo   HPI Rhonda [name] 73 y.o. -this is an acute visit.  Last seen in February 2022 after that she was supposed to see me back in 3 months.  But this visit has been scheduled acutely.  She tells me that early in May 2022 she went to [facility].  They rented a house.  She believes that the bedroom had some mold.  She did not visually confirmed the mold but husband might have.  She says shortly after going there and spending a week she started having worsening cough with hoarseness of voice.  This cough is persisted all along and the  hoarseness has persisted as well.  The in June 2022 she had a short course of steroids that did not help.  She believes the dosing was not strong enough.  In July 2022 she had some back issues and was given 30 mg of prednisone for approximately 10 days with a taper and this helped her back.  But all along the cough is persisted and severe hoarseness is persisted.  Then approximately a week ago she started getting shortness of breath along with yellow sputum.  The cough got worse .  She states even at baseline she brings her a lot of white sputum and has to cough a lot.  [place] and a couple of days into the illness.  At this point in time she started noticing she was more easily desaturating.  She is not able to swim.  She says walking to the car her pulse ox dropped to 84%.  Then the yellow phlegm started getting worse.  She called in yesterday and we prescribed Z-Pak and a prednisone burst.  She says now with the Z-Pak the sputum color is improving.  A week ago she also saw Dr. Doran Heater for chronic cough.  100 mg 3 times daily of gabapentin has been prescribed.  She is slowly increasing it.  She is worried about the side effects of this drug.  Apparently her vocal cord had ulcers from repeated coughing.  ILD symptom score shows worsening.  In  the office she is very hoarse   Of note she stopped her pirfenidone in February/March 2022 because of side effects - gerd  She uses oxygen with exertion.  There is no leg swelling or hemoptysis.  Started z pak yesterday Started 12d pred taper yesterday     PFT   OV 08/14/2021  Subjective:  Patient ID: Rhonda [name], female , DOB: 1951-08-07 , age 75 y.o. , MRN: [id number] , ADDRESS: [address] [place] [address] PCP Dois Davenport, MD Patient Care Team: Dois Davenport, MD as PCP - General (Family Medicine) Rollene Rotunda, MD as PCP - Cardiology (Cardiology) Kalman Shan, MD as Consulting Physician (Pulmonary Disease) Daleen Squibb, Jesse Sans, MD (Inactive) (Cardiology) Linna Darner, RD as Dietitian (Family Medicine) Rollene Rotunda, MD as Consulting Physician (Cardiology)  This Provider for this visit: Treatment Team:  Attending Provider: Kalman Shan, MD  Follow-up chronic hypersensitive pneumonitis on prednisone 5 mg/day and also pirfenidone submaximal dose of 2 pills 3 times daily since October 2021.  On ILD-pro registry protocol - failed ofev   - failed esbrit due to side effects Larrie Kass 2022  She is also on Breo  08/14/2021 -   Chief Complaint  Patient presents with   Follow-up    Pt states she is feeling better since last visit. States she did receive her lighterweight POC which has been working well for her.     HPI Rhonda [name] 73 y.o. -seen last month with worsening symptoms acute bronchitis/flare.  Given Z-Pak and prednisone.  She is significantly better.  She tells me that even though she is better and his subjective symptom score is better below.  She still feels not back to her May 2022 baseline.  She says in the neighborhood she has to use more oxygen.  She is interested in a backpack for oxygen.  She is not able to swim as well as she used to.  She says acid reflux is under better control now  after seeing Dr. Doran Heater in ENT.  She is  wondering about starting pirfenidone she definitely does not want to do nintedanib again.  But even with pirfenidone she is reluctant.  We discussed CellCept because of progression but because of the immunosuppression she is reluctant.  We resolved that she would increase the prednisone to 10 mg/day and reassess.  She was supposed to have done a spirometry today but it did not happen.  She will have it done again in 4 weeks.  Have requested a schedule for that.    The main concern right now even though is better is that she is not to her baseline as of a year ago and she feels like she is having slowly progressive disease interstitial lung disease/chronic HP.       OV 10/03/2021  Subjective:  Patient ID: Rhonda [name], female , DOB: 06/03/1951 , age 14 y.o. , MRN: [id number] , ADDRESS: [address] [place] [address] PCP Dois Davenport, MD Patient Care Team: Dois Davenport, MD as PCP - General (Family Medicine) Rollene Rotunda, MD as PCP - Cardiology (Cardiology) Kalman Shan, MD as Consulting Physician (Pulmonary Disease) Daleen Squibb, Jesse Sans, MD (Inactive) (Cardiology) Linna Darner, RD as Dietitian (Family Medicine) Rollene Rotunda, MD as Consulting Physician (Cardiology)  This Provider for this visit: Treatment Team:  Attending Provider: Kalman Shan, MD    10/03/2021 -   Chief Complaint  Patient presents with   Follow-up    Pt states her breathing has become worse since last visit. States she is having to use her O2 almost all the time now with exertion.    Follow-up chronic hypersensitive pneumonitis on prednisone 5 mg/day and also pirfenidone submaximal dose of 2 pills 3 times daily since October 2021.  On ILD-pro registry protocol - failed ofev due to side efect  - failed esbrit due to sde effects marvh 2022  -Last echo 2018  -Last high-res CT March 2021. -> Nov 2022 stable between 2  She is also on Breo  HPI Rhonda [name] 73 y.o. -returns  for follow-up.  In this visit she is categorically saying that she is significantly worse.  This point in particular relationship to dyspnea on exertion.  Her weight itself is stable.  She is currently on 5 mg prednisone per day [last time I thought I increased it to 10 mg/day].  Nevertheless she says other interim changes is that she has had slow weight gain.  BMI is 30.  She started semaglutide for hemoglobin A 1.C 6.5.  Is also to help weight.  She was also after seeing Dr. Doran Heater for her cough and significant amount of anti-PPI therapy.  She said this made her diarrhea worse.  She then reduced it and currently is taking omeprazole 20 mg/day in the daytime and 40 mg at night and in between Pepcid as well.  She stopped the gabapentin.  She feels after reducing the PPI the diarrhea is improved.  The cough is not as bad as it was even after stopping gabapentin but she continues to be fatigued.  She complains of bloating.  She feels her stomach is full.  She feels acid reflux is worse.  Acid reflux is worse after reducing the dose of H2 blockade.  She also has early satiety.  She also states when she coughs she vomits.  She feels she needs to see Dr. Rayetta Pigg.  I have messaged him and he hasagreed for him or his PA to see  her soon.  Nevertheless symptoms are significantly worse as seen on the symptom score below.  She is also reporting increased tachycardia with exertion and apparently Dr. Antoine Poche adjusted her beta-blocker.  She has upcoming appointment with them.    She says that at times when she desaturates easily at home even for minimal exertion.  We walked her today with a forehead probe and she actually did much better than expected going down to 89% only at the end of 3 labs [this is actually an improvement from the recent past versus stability].  She might have an erratic finger pulse ox probe at home.   Her pulmonary function shows a significant decline in DLCO.  Last echocardiogram 2018.   Last high-resolution CT chest March 2021.  Never had right heart catheterization.  Hemoglobin normal 14.7 g% in August 2022.           OV 10/21/2021  Subjective:  Patient ID: Rhonda [name], female , DOB: 1951/06/02 , age 52 y.o. , MRN: [id number] , ADDRESS: [address] [place] [address] PCP Dois Davenport, MD Patient Care Team: Dois Davenport, MD as PCP - General (Family Medicine) Rollene Rotunda, MD as PCP - Cardiology (Cardiology) Kalman Shan, MD as Consulting Physician (Pulmonary Disease) Daleen Squibb, Jesse Sans, MD (Inactive) (Cardiology) Linna Darner, RD as Dietitian (Family Medicine) Rollene Rotunda, MD as Consulting Physician (Cardiology)  This Provider for this visit: Treatment Team:  Attending Provider: Kalman Shan, MD  Type of visit: Video Circumstance: COVID-19 national emergency Identification of patient MATAYA KILDUFF with Aug 05, 1951 and MRN [id number] - 2 person identifier Risks: Risks, benefits, limitations of telephone visit explained. Patient understood and verbalized agreement to proceed Anyone else on call: no Patient location:  her home This provider location: [address], [address]; [place]; [place] [address]. [facility]. [id number]    10/21/2021 -     Follow-up chronic hypersensitive pneumonitis on prednisone 5 mg/day and also pirfenidone submaximal dose of 2 pills 3 times daily since October 2021.  On ILD-pro registry protocol - failed ofev due to side efect  - failed esbrit due to sde effects marvh 2022  -Last echo 2018  -Last high-res CT March 2021.  She is also on Breo HPI Rhonda [name] 73 y.o. -returns for follow-up via video visit to discuss test results.  She says currently she is just doing PPI lower dose 20 mg omeprazole in the daytime and famotidine at night.  With this her diarrhea is better.  Also bloating is better.  Correlating with this for she feels her lungs are stable.  She does  say that even now when she bends down she can desaturate.  She is not using oxygen at night.  Her primary care is testing her over no at night to see if she really needs it at night.  Nevertheless she feels somewhat better.  She had echocardiogram because of concern of pulmonary hypertension and there is no evidence of pulmonary hypertension.  She had high-resolution CT chest which the radiologist was described as minimally progressive since 18 months earlier in March 2021.  She had pulmonary function test that shows the FVC to be stable in the last few to several months but the DLCO to show slight decline.  Nevertheless the pattern at least compared to 2 years ago is 1 of progressive ILD on the pulmonary function test.  Certainly the hypoxia with easy exertion compared to a few years ago or a year  ago is also a sign that her ILD is getting worse.  She is likely stable or stabilized in the last few to several months.  We discussed going challenge again with nintedanib or pirfenidone but she is not interested.  We discussed doing CellCept and discussed the side effect profile of this.  She is not interested.  We discussed transplant referral for evaluation now that she is progressing so she can get plugged in and have a good conversation.  She is reflected on this from the last visit and she is not interested  We discussed the acid reflux controlled she feels she is at a good place right now with good acid reflux controlled with PPI in the morning and H2 blockade at night.  She has seen GI and at this point I believe they are not going to do pH probe.  I reviewed the note.  She continues on Breo and prednisone 5 mg/day which she plans to continue.  She has heard about the promedior trial with IV Pnetraxin for IPF.  This might be a study for non-- IPF progressive phenotype.  She prefers to do the study because it bypasses the GI route and it is an IV study.  We do not know if the study is going to happen.  We  do not know if you are going to be a site.  If these things all get aligned then maybe we could consider that.        OV 07/21/2022  Subjective:  Patient ID: Rhonda [name], female , DOB: 1950-12-03 , age 40 y.o. , MRN: [id number] , ADDRESS: [address] [place] [address] PCP Dois Davenport, MD Patient Care Team: Dois Davenport, MD as PCP - General (Family Medicine) Rollene Rotunda, MD as PCP - Cardiology (Cardiology) Kalman Shan, MD as Consulting Physician (Pulmonary Disease) Daleen Squibb, Jesse Sans, MD (Inactive) (Cardiology) Linna Darner, RD as Dietitian (Family Medicine) Rollene Rotunda, MD as Consulting Physician (Cardiology)  This Provider for this visit: Treatment Team:  Attending Provider: Kalman Shan, MD  She is also on [facility]  07/21/2022 -   Chief Complaint  Patient presents with   Follow-up    PFT performed today.  Pt states she has been doing good since last visit.     HPI VALEN MASCARO 73 y.o. -returns for follow-up.  Overall in the summer months she is doing well.  She went to the [address] of [place] in [place].  In the areas where [place] was a lot of [place] and clean air she was able to walk down the beach 200 steps and including climb up with the oxygen without desaturation and feeling good.  But when she got to [facility] where there was pollution she started having cough.  She currently feels really good.  She feels well symptom scores are improved.  Walking desaturation test is also improved.  She attributes this to some weight loss and overall healthy living.  She is on Ozempic that is helping her lose weight.  She is swimming.  She does have some cough recently for the last week because of allergies.  Her dog Nicholes Calamity passed away.  Therefore she is taking care of the other dog and this is increasing her allergies and cough but still overall symptoms are improved.  She continues on prednisone 5 mg/day.  Currently she not  interested in any prednisone burst.  Social: She is expecting to be a grandmother in a few  months.     OV 10/27/2022  Subjective:  Patient ID: Rhonda [name], female , DOB: [birth year] , age 15 y.o. , MRN: [id number] , ADDRESS: [address] [place] [address] PCP Dois Davenport, MD Patient Care Team: Dois Davenport, MD as PCP - General (Family Medicine) Rollene Rotunda, MD as PCP - Cardiology (Cardiology) Kalman Shan, MD as Consulting Physician (Pulmonary Disease) Daleen Squibb, Jesse Sans, MD (Inactive) (Cardiology) Linna Darner, RD as Dietitian (Family Medicine) Rollene Rotunda, MD as Consulting Physician (Cardiology)  This Provider for this visit: Treatment Team:  Attending Provider: Kalman Shan, MD    10/27/2022 -   Chief Complaint  Patient presents with   Acute Visit    Pt has been having complaints of increased SOB and feels like she is having a flare up. Pt states she took zpak and prednisone about a month ago. States she has had some desats in oxygen levels.     HPI TERRINA DOCTER 73 y.o. -presents for follow-up.  This is an acute visit.  She is now a grandmother.  The granddaughter's name is Laurie Panda.  She is pretty excited about that.  However she is having deterioration in her symptoms.  She says when she saw me in August 2023 she was actually quite well but she thought looking back she might of started with a cough.  She did travel west but then starting September 2023 she started having worsening cough which typically happens in the fall with the leaves get dry.  I prescribed 8-10-day prednisone burst along with Z-Pak.  She states the prednisone always helps her.  However she feels that the prednisone I prescribed was not probably strong enough.  Since then she is having a deterioration in the cough.  She also feels more fatigued.  There is white sputum coming with this.  She is also having worsening shortness of breath for the last 2 or 3 weeks.  She  feels this time the shortness of breath is different and is the more dominant symptoms.  Usually cough is the more dominant symptoms.  The deterioration in shortness of breath really suddenly in the last few weeks.  There are some associated exertional chest pain.  She did see Dr. Antoine Poche recently.  I reviewed his note.  She has had a previous normal stress test so he felt reassured this was not cardiac.  He did contemplate stopping the metoprolol given her "reactive airway disease" [she has hypersensitivity pneumonitis and can occasionally wheeze but I did leave her on metoprolol being a better once because of the beneficial effects on her heart rate with exertion].  Her symptom score showed deterioration  Her walking desaturation test also shows significant deterioration.  Similar to September 2022 but a lot worse.  This the worst its ever been.  There is no fever     OV 12/10/2022  Subjective:  Patient ID: Rhonda [name], female , DOB: 15-Jun-1951 , age 46 y.o. , MRN: [id number] , AD[address]ESS: [address] [place] [address] PCP Dois Davenport, MD Patient Care Team: Dois Davenport, MD as PCP - General (Family Medicine) Rollene Rotunda, MD as PCP - Cardiology (Cardiology) Kalman Shan, MD as Consulting Physician (Pulmonary Disease) Daleen Squibb, Jesse Sans, MD (Inactive) (Cardiology) Linna Darner, RD as Dietitian (Family Medicine) Rollene Rotunda, MD as Consulting Physician (Cardiology)  This Provider for this visit: Treatment Team:  Attending Provider: Kalman Shan, MD      12/10/2022 -   Chief Complaint  Patient presents with   Follow-up    Pft results from yesterday. Pt states that 10 mg of prednisone is helping.      HPI ELTA ANGELL 73 y.o. -here for follow-up.  After the last visit she called saying 5 mg of prednisone was not helping her.  She told me this time that at 50 mg of prednisone she feels great but then as she tapers and she gets into 10 mg  prednisone she starts getting symptoms and at 5 mg symptoms are significantly worse.  She then called and she is back up to 10 mg and she feels better with just very mild symptoms.  In fact walking desaturation test is more stable her exam is more stable.  She feels she is got hypersensitive airways.  I did indicate to her that it is possible there is allergic or eosinophilic inflammatory component.  However her blood eosinophils have always been normal.  But then it is Mast by chronic prednisone.  Currently she is on 10 mg of prednisone.  We took a shared decision for her to stay on 10 mg prednisone acknowledging the risk because this seems to be the best balance between symptom relief and quality of life.  Even exercise hypoxemia is better.  She did notice that recently in the last few months perfume particularly bothering her.  But otherwise she is stable.  She plans to revisit with her allergist Dr. Eileen Stanford   Current pulmonary function test was reviewed and is stable.      01/20/2023 Acute OV : ILD -chronic hypersensitivity pneumonitis and chronic respiratory failure Patient complains over the last 6 months that she has been having waxing and waning of symptoms with increased cough shortness of breath and decreased activity tolerance.  She has been treated with 3 steroid boost.  She says each time she has gotten much better and actually felt the best that she has felt in a long time.  But within a couple weeks of returning to prednisone 5mg  daily her symptoms started to return.  Most recently started noticing that her breathing was not doing as good 3 weeks ago.  Started to have more cough, congestion thick mucus decreased activity tolerance and increased oxygen demands.  Today in the office walk test shows patient is unable to maintain O2 saturations on pulsed oxygen.  Requires continuous flow oxygen. Patient was started on a prednisone burst yesterday at 40 mg to be tapered by 10 mg every 4 days.   She does feel like she is some better since starting on the higher dose of prednisone yesterday.  Chest x-ray today shows chronic ILD changes.  No acute process. She denies any fever, chest, orthopnea, edema, calf pain.  OV 02/11/2023  Subjective:  Patient ID: Rhonda [name], female , DOB: [birth year] , age 14 y.o. , MRN: [id number] , ADDRESS: [address] [place] [address] PCP Dois Davenport, MD Patient Care Team: Dois Davenport, MD as PCP - General (Family Medicine) Rollene Rotunda, MD as PCP - Cardiology (Cardiology) Kalman Shan, MD as Consulting Physician (Pulmonary Disease) Daleen Squibb, Jesse Sans, MD (Inactive) (Cardiology) Linna Darner, RD as Dietitian (Family Medicine) Rollene Rotunda, MD as Consulting Physician (Cardiology)  This Provider for this visit: Treatment Team:  Attending Provider: Kalman Shan, MD    02/11/2023 -   Chief Complaint  Patient presents with   Follow-up    Increase mucous, cough and oxygen drops when she gets down to 10 mg of  prednisone symptoms start to come back.  She remains on 10 mg and states it is not holding her.     HPI FIDENCIA MCCLOUD 73 y.o. -returns for follow-up.  She tells me that compared to a year's this year she is requiring more prednisone.  Even when I saw her last time she felt she needed a baseline of 10 mg prednisone.  We did that.  For years she been on 5 mg prednisone.  She started allergy shots recently Imuran does not helping her.  She is needed quite a few prednisone burst and every time she goes to below 20 mg she feels she is desaturating easily.  In the past it used to be that she could maintain at 5 mg/day.  Then late last year she needed 10 mg low-dose of prednisone per day.  Now she feels even  prednisone 10 mg is not "holding it".  She says when she was on 40 mg prednisone she would climb stairs on room air and the pulse ox would stay at 95% and above.  Now she is on 10 mg/day of prednisone and she is  requiring 5 L of portable oxygen to climb up 1 flight of stairs without desaturations.  She is very worried about her disease.  We did get a high-resolution CT chest and there is no no progression but clinically she is not behaving like that.  Last echocardiogram November 2022 and there was no elevated pulmonary artery pressures.  She is willing to get another echocardiogram.     We discussed the fact she is guarding progressive ILD despite what the CT says.  She has got active inflammation.  I indicated to her that we will commit to an intermediate course of prolonged prednisone at higher dose.  She is willing to do that and accept the risk.  Also indicated to her that we would need to add another steroid sparing and other immunomodulatory agent and would need to check safety labs.  We discussed there is no data on Rituxan for this particular issue of chronic HP progressive pulmonary fibrosis but there is data on CellCept.  She does not like the idea that CellCept can cause diarrhea.  We went over the immunosuppressive and long-term cancer risk.  Did indicate to her that benefit outweighs the risk at this point.  She is agreeable to safety labs and be open to CellCept.  She is committing to CellCept.  But there is a possibility a month from now if she is doing well on the higher dose prednisone and handling it well she might feel differently about CellCept.  We again discussed about lung transplant as an option and she is not interested.   ACUTE OV NAte MEier 03/18/23  ory of chronic HP (has had VATS bx 2013 supportive of dx) f/b MR and seen by [facility] transplant program.   Last seen in clinic 02/11/23. At that point on prednisone 5 mg daily, pirfenidone. Had planned to start cellcept at that visit. Ended up shifting gears and then had been planned for RHC for evaluation for candidacy for tyvaso today. 3 weeks ago started a prednisone taper from 40 mg daily x1 week, currently on 20 mg daily for the rest  of this week.   Never did start z pack. She actually kinda wonders if she has sinus infection.   She does not take bactrim due to history of headaches with sulfa abx.   Weight is stable relative to prior visits  Otherwise pertinent review of systems is negative.     OV 04/13/2023  Subjective:  Patient ID: Rhonda [name], female , DOB: June 04, 1951 , age 49 y.o. , MRN: [id number] , ADDRESS: [address] [place] [address] PCP Dois Davenport, MD Patient Care Team: Dois Davenport, MD as PCP - General (Family Medicine) Rollene Rotunda, MD as PCP - Cardiology (Cardiology) Kalman Shan, MD as Consulting Physician (Pulmonary Disease) Daleen Squibb, Jesse Sans, MD (Inactive) (Cardiology) Linna Darner, RD as Dietitian (Family Medicine) Rollene Rotunda, MD as Consulting Physician (Cardiology)  This Provider for this visit: Treatment Team:  Attending Provider: Kalman Shan, MD    04/13/2023 -   Chief Complaint  Patient presents with   Follow-up    F/up   .   HPI LETETIA ROMANELLO 73 y.o. -returns for follow-up.  Last seen approximately 2 months ago.  At that time she was tell me she needs a higher dose of prednisone.  Consider CellCept and also right heart catheterization.  After much reflection she declined CellCept and decided to go with right heart catheterization to the research protocol.  Therefore she had to have a second echocardiogram that showed mild elevation of pulmonary artery pressure.  She was going to have a right heart catheterization but then she got ill saw one of my colleagues urgently.  She needed to go back up on the prednisone.  She reports that every time she tapers the prednisone she gets ill.  Therefore Dr. Thora Lance a month ago did a more extended slower taper of prednisone.  Currently she is on 20 mg/day since Apr 10, 2023 and so far her lung function is holding.  In fact her symptoms are better.  Her exercise hypoxemia test is also better and she feels  from a lung perspective a lot better.  She is clearly prednisone responsive.  However the high-dose of prednisone is causing significant changes with her metabolism particular with diabetes.  She is describing vision changes, hyperglycemia increased thirst, fatigue hemoglobin A1c is shot up to 7.5.  Primary care physician Dr. Nadyne Coombes has started on Jardiance and back on Ozempic.  On Apr 08, 2023 lab results review also shows increased hyperlipidemia.    Again had a conversation that she seems to have overall progressive phenotype with hypersensitive pneumonitis that is extremely prednisone sensitive.  Unclear why she is losing control and why we are needing higher dose of prednisone.  I recommended steroid sparing CellCept again.  However she is reluctant particular with the side effect profile.  We went over the cancer risk and opportunistic infection risk.  Side effect of diarrhea.  She wants to hold off.  She prefers to have a right heart catheterization and if that is pulmonary hypertension try inhaled treprostinil both of the pulmonary hypertension and potential theoretical risk of disease modulation.  The research study PHINDER (Dr Judeth Horn PI) can be restarted according to the research coordinator.  But she will have to have another echocardiogram first.  The billing issues when she first got into the study where the bills from the hospital were being sent to her.  She says this is now being resolved and she is no longer receiving those bills.  Did indicate to her that on no account she should be paying recent procedure bills although still should be going to insurance.       OV 06/08/2023  Subjective:  Patient ID: Rhonda [name], female , DOB: 1950/12/22 , age 44 y.o. ,  MRN: [id number] , ADDRESS: [address] [place] [address] PCP Dois Davenport, MD Patient Care Team: Dois Davenport, MD as PCP - General (Family Medicine) Rollene Rotunda, MD as PCP - Cardiology  (Cardiology) Kalman Shan, MD as Consulting Physician (Pulmonary Disease) Daleen Squibb, Jesse Sans, MD (Inactive) (Cardiology) Linna Darner, RD as Dietitian (Family Medicine) Rollene Rotunda, MD as Consulting Physician (Cardiology)  This Provider for this visit: Treatment Team:  Attending Provider: Kalman Shan, MD    06/08/2023 -   Chief Complaint  Patient presents with   Follow-up   HPI Rhonda [name] 73 y.o. -returns for follow-up.  She had pulmonary function test and this appears stable.  She tells me that with the higher dose prednisone she has been doing well and her cough and effort tolerance is really good . However, having sifngianct hyperglycemia.  She is going to meet with primary care physician.  This because she is not able to tolerate JArdiance due to GI side effects. Without Jardiance she feels good but worried about her hyperglycemia. So she wants to drop prednisone to 15mg  per day. I am supportive of this.   Other issue though PFT stable she reports 2 weeks of new coughing that feels different from when her HP cough when she lowers prednisone.  She brings out white sputum after coughing much.  She feels the pain in the throat for the last 1 week.  Her daughter and Asa Lente and son-in-law all have COVID.  They have not taken Paxlovid.  She believes she might have been exposed but she is tested for COVID multiple times.  She was in contact with me about this and the COVID screen negative.  On oral exam I thought there was some thrush.   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 I have messaged Dr Shirlee Latch to set up RHC through [facility]. She went out of window for the phinder study         OV 10/06/2023  Subjective:  Patient ID: Rhonda [name], female , DOB: 11-29-1950 , age 30 y.o. , MRN: [id number] , ADDRESS: [address] [place] [address] PCP Dois Davenport, MD Patient Care Team: Dois Davenport, MD as PCP - General (Family  Medicine) Rollene Rotunda, MD as PCP - Cardiology (Cardiology) Kalman Shan, MD as Consulting Physician (Pulmonary Disease) Daleen Squibb, Jesse Sans, MD (Inactive) (Cardiology) Linna Darner, RD as Dietitian (Family Medicine) Rollene Rotunda, MD as Consulting Physician (Cardiology)  This Provider for this visit: Treatment Team:  Attending Provider: Kalman Shan, MD    10/06/2023 -   Chief Complaint  Patient presents with   Follow-up    Breathing is doing well today and she states "I feel very stable". Currently taking pred 20 mg. She has not used her o2 in a month.      Pft f/u, pt would like to go down on prednisone. Dealing with cough that is worse then baseline, with thick white mucus production.     HPI LITSY EPTING 73 y.o. -returns for follow-up.  She is currently on 20 mg/day prednisone.  She says is the best dose ever.  She feels her lung function is stable.  In fact PFT shows stability.  Excess hypoxemia test is also slightly better.  His symptom scores also showing stability and good control.  She wants to stay at this dose.  She understands that this means more side effects.  She is already seeing a libido for dry skin hyperglycemia.  Her recent bone density test was normal.  She is up-to-date with her vaccination.  We discussed right heart catheterization.  She was going to have this based on echo from spring 2024 but her BNP at that time was normal currently she is stable.  Therefore we took a shared decision making to hold off on that.  Previously G6PD was normal.  We discussed Bactrim prophylaxis.  Discussed that she is on prednisone more than 10 mg/day but she is not on 2 immunosuppressants with a slight elevated risk and opportunistic infections.  Currently her GI system is feeling well and she is sensitive to medications.  Therefore we took a shared decision making to hold off on Bactrim prophylaxis for PJP and other opportunistic infections.  No other new issues.  Of  note she has inappropriate sinus tachycardia.  She is on beta-blocker for this.  She is being followed by Dr. Antoine Poche for this.  She has upcoming visit in March 2025.  I did indicate to her that ivabradine is being used off label for inappropriate sinus tachycardia and she could discuss this with Dr. Antoine Poche.  She acknowledged understanding.       OV 01/31/2024  Subjective:  Patient ID: Rhonda [name], female , DOB: 1951/05/19 , age 29 y.o. , MRN: [id number] , ADDRESS: [address] [place] [address] PCP Dois Davenport, MD Patient Care Team: Dois Davenport, MD as PCP - General (Family Medicine) Rollene Rotunda, MD as PCP - Cardiology (Cardiology) Kalman Shan, MD as Consulting Physician (Pulmonary Disease) Daleen Squibb, Jesse Sans, MD (Cardiology) Linna Darner, RD as Dietitian (Family Medicine) Rollene Rotunda, MD as Consulting Physician (Cardiology)  This Provider for this visit: Treatment Team:  Attending Provider: Kalman Shan, MD   Follow-up chronic hypersensitive pneumonitis ; slow progressive phenotype -  On ILD-pro registry protocol - On chronic prednisone  - 5mg  per day but stince spring 2024 needing more brsts and settled on 20mg  per day as of Nov 2024 - failed ofev due to side efect  - failed esbriet due to sde effects  March 2022   -Last echo 2018  - declind lung tx referral 2023   - declined cellcept May 2024   - Last high-res CT Nov 222 -> mrach 2024: no change  Lst ECHO   -March 2024 and April 2024: Mild elevation of pulmonary artery pressure  -Right heart cath pending as of July 2024. -> decided against it Nov 2024 due to dong well  01/31/2024 -   Chief Complaint  Patient presents with   Follow-up     HPI DAYONNA SELBE 73 y.o. -been managing winter without flare up. O2 use only with heavy eertion. Prednisone is at 15mg . PEr her cards and PCP - lung sound good. On Pred 20mg  -> very hyperglycemic and had blurry vision in sept 2024.  Now  down tat 15mg  pre day. But also doing insulin both Long Acting and short acting. Goal is < 250mg % ( . She did well with ozempic till she got "stomach paresis" and stopped ozempic.  Prednisone is causing diarrhea.   We talked about cellcept. Worried abotu cellcept -stomach issues . Worried about immune suppression. Goal is steroid sparing and helping reduce sugars. Talked about clinical experience with it.   Recommended pharmacisit counseling. - [name] GAJERA. She is more aligned about starting cllcept due to significant hyperglucemia with steroids. Overall I informed her worth looking at benefit > risk  Overall GI issues are much better after dc  ozempic -> but still 1/month IBS     SYMPTOM SCALE - ILD Sept 2020 01/30/2020  08/29/2020  01/14/2021 146# 07/08/2021 147# - off esbriet. Sic past week 08/14/2021 149# - pred only. No antifibrotic 10/03/2021 148# - pred 5mg  07/21/2022 137# 10/27/2022  04/13/2023 20mg  pered - 134# 06/08/2023 135# , pred 20mg  10/06/2023 138#  O2 use  o2 with ex o2 withe ex 2L     2LL o2 with ex 2L with ex 2 laps on RA     Shortness of Breath  0 -> 5 scale with 5 being worst (score 6 If unable to do)            At rest 0 0 1 0 1 0 1 0 1 1  0 0  Simple tasks - showers, clothes change, eating, shaving 01 1 1 1.5 2 1 2 1 2 1 1 1   Household (dishes, doing bed, laundry) 0 3 3 3 4 2  3.5 2 3 2 2 2   Shopping 1 1 1 2 2 1 1 1 2 1 2 1   Walking level at own pace 1 2 2 1 2 2 3 1 2 2 1 3   Walking up Stairs 2 3 3 3 4 3 4 3 4 3 4  2.5  Total (30-36) Dyspnea Score 5 10 11  10.5 15 9  14.5 8 14 10 10 9   How bad is your cough? 1 3 2 2 2 2 2 2 3  0 2 1  How bad is your fatigue 1 2 2 3 2  0 3 1 2 2 4 2   How bad is nausea  0 0 3 0 0 1 0 0 0 0  0  How bad is vomiting?   0 0 0 0 0 1 0 0 0 0  0  How bad is diarrhea?  00 1 0 0 0 1 0 0 0 1  0  How bad is anxiety?  0 0 0 00 0 2 0 00 0 0  0  How bad is depression  0 2 1 1 0 1 0 0 0 0  0  00 0 0 Simple office walk 185 feet x  3 laps goal with forehead  probe 04/13/2018  08/16/2018  12/15/2018  05/09/2019  08/22/2019  01/30/2020  08/29/2020  01/14/2021  07/08/2021  08/14/2021  10/03/2021  07/21/2022  10/27/2022  04/13/2023  06/08/2023  10/06/2023   O2 used Room air Room air Room air Room air Room air Room air ra ra ra ra ra ra ra ra Ra sit stand ra  Number laps completed 3 3 3 3 2  stopped at 2 die to hip pain 3 laps - no hip pain following hip surgery 3 3 3  attempted byt did  only 2 3 bu stopped at 2 3 and did all 3 3 Ddi onl 2 las Sit/stand x 10 times X 10 Sit stand x 15  Comments about pace good Moderate pace Normal, hip bothering     avg pace avg  avg avg avg Good pace    Resting Pulse Ox/HR 98% and 73/min 98% and HR 77/min 99% and HR 61/min 98% and HR 70/min 98% 98% and 75/min 97% and 67/mi 94% and 80/min 96% and HR 72 98% and 71 100% and HR 82 98% ad HR 72 100% and HR 79 97% and heart rate 74 93% and HR 83 98% and HR 91  Final Pulse Ox/HR 91% and 91/min 93% and 92.min 94% and 92/min 93% and 98/min 91%  89% and 93/,imn  88% and 94 89% ad 88.nin 88% at 2nd  lap end HR 92 86% and 96 89% and HR 91 93% and HR 87 84% and HR 104 94% and heart rate 92 87% and HR 90 92% and HR 105  Desaturated </= 88% no no no no   yes almost yes yes almst no yes no yes   Desaturated <= 3% points yes yes Yes, 5 points Yes, 5 points    Yes, 5 points Yes, 8 points Yes, 12 pints Yes 11 pot 5 pots 16 pont Yes, 3 poi yes   Got Tachycardic >/= 90/min yes yes yes yes     yes yes yes no      Symptoms at end of test none none Hip pain and very mild dyspnea  Stopped due to hip pain Moderate duyspnea with mask Mild to moderate dyspnea  Severe dyspnea Mild dyspnea Severe dyspnea Very mild dyspneax moderate No dyspnea Mild dyspnea   Miscellaneous comments x x    No hip pain    Similar to lastime Symptoms out of proportion ? better worse        PFT     Latest Ref Rng & Units 10/01/2023    9:49 AM 06/04/2023   10:12 AM 03/08/2023   10:49 AM 12/09/2022   11:48 AM 07/21/2022    9:50 AM  01/06/2022    9:51 AM 10/03/2021    8:59 AM  PFT Results  FVC-Pre L 1.43  1.44  1.45  1.53  1.54  1.58  1.48   FVC-Predicted Pre % 60  60  64  66  67  67  63   Pre FEV1/FVC % % 92  91  93  87  91  90  92   FEV1-Pre L 1.31  1.30  1.35  1.32  1.40  1.43  1.37   FEV1-Predicted Pre % 73  73  80  77  81  81  78   DLCO uncorrected ml/min/mmHg 9.73  10.82  8.11  9.64  8.16  8.62  8.92   DLCO UNC% % 58  64  50  59  50  53  55   DLCO corrected ml/min/mmHg 9.73  10.82  7.81  9.41  7.86  8.62  8.92   DLCO COR %Predicted % 58  64  48  58  48  53  55   DLVA Predicted % 81  85  65  81  64  75  78   TLC L   3.02       TLC % Predicted %   70       RV % Predicted %   70            LAB RESULTS last 96 hours No results found.       has a past medical history of Allergy, Arthritis, Asthma, Cataract, Coronary artery calcification seen on CAT scan (08/19/2017), DDD (degenerative disc disease), thoracic, Depression, DOE (dyspnea on exertion), Fatty liver, GERD (gastroesophageal reflux disease), H/O steroid therapy, Heart palpitations (02/28/2015), Helicobacter pylori ab+, Hemorrhoids, Hiatal hernia, High cholesterol, History of chronic bronchitis, History of migraine, History of MRSA infection, Hyperlipidemia, mixed (08/19/2017), Hyperplastic colon polyp (2007), IBS (irritable bowel syndrome), Inguinal hernia, Insulin resistance, Interstitial lung disease (HCC), MVP (mitral valve prolapse), Pneumonia, Pneumonitis, hypersensitivity (HCC), PONV (postoperative nausea and vomiting), Pre-diabetes, Pulmonary fibrosis (HCC), PVC (premature ventricular contraction) (08/19/2017), Rapid heart rate, Squamous cell carcinoma of skin, Tinnitus, right ear, and Vocal cord ulcer.  reports that she has never smoked. She has never used smokeless tobacco.  Past Surgical History:  Procedure Laterality Date   44 HOUR PH STUDY N/A 02/21/2018   Procedure: 24 HOUR PH STUDY;  Surgeon: Napoleon Form, MD;  Location: [facility]  ENDOSCOPY;  Service: Endoscopy;  Laterality: N/A;   BREAST BIOPSY Right 2009   BIOPSY, pt denies   BREAST BIOPSY Left 2003   Benign    CATARACT EXTRACTION Left    CESAREAN SECTION     COLONOSCOPY     ESOPHAGEAL MANOMETRY N/A 02/21/2018   Procedure: ESOPHAGEAL MANOMETRY (EM);  Surgeon: Napoleon Form, MD;  Location: [facility] ENDOSCOPY;  Service: Endoscopy;  Laterality: N/A;   FOOT FRACTURE SURGERY  2006 or 2007   right   HYMENECTOMY     LUNG BIOPSY  09/28/2012   Procedure: LUNG BIOPSY;  Surgeon: Loreli Slot, MD;  Location: [facility] OR;  Service: Thoracic;  Laterality: N/A;  lung biopsies tims three   SQUAMOUS CELL CARCINOMA EXCISION Left    left arm   TOTAL HIP ARTHROPLASTY Right 09/10/2015   Procedure: RIGHT TOTAL HIP ARTHROPLASTY ANTERIOR APPROACH;  Surgeon: Durene Romans, MD;  Location: [facility] ORS;  Service: Orthopedics;  Laterality: Right;   TOTAL HIP ARTHROPLASTY Left 10/03/2019   Procedure: TOTAL HIP ARTHROPLASTY ANTERIOR APPROACH;  Surgeon: Durene Romans, MD;  Location: [facility] ORS;  Service: Orthopedics;  Laterality: Left;  70 mins   TUBAL LIGATION     UPPER GI ENDOSCOPY     VIDEO ASSISTED THORACOSCOPY  09/28/2012   Procedure: VIDEO ASSISTED THORACOSCOPY;  Surgeon: Loreli Slot, MD;  Location: [facility] OR;  Service: Thoracic;  Laterality: Right;   VIDEO BRONCHOSCOPY  11/19/2011   Procedure: VIDEO BRONCHOSCOPY WITH FLUORO;  Surgeon: Kalman Shan, MD;  Location: [facility] ENDOSCOPY;  Service: Endoscopy;;    Allergies  Allergen Reactions   Atorvastatin Other (See Comments)    Leg pain   Betadine [Povidone Iodine] Other (See Comments)    blisters   Codeine Nausea And Vomiting and Other (See Comments)    Other reaction(s): Vomiting   Garlic Diarrhea   Hydrocodone Nausea And Vomiting and Other (See Comments)   Macrobid [Nitrofurantoin] Other (See Comments)   Ofev [Nintedanib] Other (See Comments)    Abdominal pain   Onion Diarrhea   Other Other (See Comments)   Rosuvastatin Other (See  Comments)    Leg pain   Shellfish Allergy Nausea And Vomiting   Statins Other (See Comments)    "Leg pain", Other reaction(s): Other (See Comments), Other (See Comments), "Leg pain", Leg pain, Leg pain   Sulfa Antibiotics    Sulfonamide Derivatives Other (See Comments)    headaches    Immunization History  Administered Date(s) Administered   Fluad Quad(high Dose 65+) 07/17/2019   Hepatitis A 05/23/2010   Hepatitis B 06/23/2006   Influenza Split 08/11/2012, 08/14/2017   Influenza Whole 09/08/2011   Influenza, High Dose Seasonal PF 08/24/2016, 07/24/2017, 08/31/2018, 08/15/2020, 09/26/2020, 09/12/2021, 08/24/2022   Influenza,inj,Quad PF,6+ Mos 09/07/2013   Influenza-Unspecified 09/26/2014, 07/27/2015, 08/24/2023   MMR 05/23/2010   Moderna Sars-Covid-2 Vaccination 07/30/2021, 01/01/2022   PFIZER(Purple Top)SARS-COV-2 Vaccination 12/16/2019, 01/08/2020, 07/18/2020, 08/11/2022   Pneumococcal Conjugate-13 03/15/2014   Pneumococcal Polysaccharide-23 01/11/2018, 09/26/2020, 08/26/2021   Respiratory Syncytial Virus Vaccine,Recomb Aduvanted(Arexvy) 07/28/2022   Td 02/22/2003   Tdap 10/28/2012   Zoster Recombinant(Shingrix) 02/15/2017, 05/17/2017   Zoster, Live 12/04/2010    Family History  Problem Relation Age of Onset   Asthma Mother    Osteoarthritis  Mother    Dementia Mother 10   Lymphoma Father    Diabetes Father    Hypertension Sister    Heart disease Sister    Kidney disease Sister    Allergic rhinitis Daughter    Ovarian cancer Maternal Aunt    Breast cancer Paternal Aunt    Breast cancer Paternal Aunt    Emphysema Maternal Grandmother    Asthma Maternal Grandmother    Lung disease Maternal Grandfather    Bone cancer Paternal Grandfather    Breast cancer Cousin    Breast cancer Cousin    Breast cancer Cousin    Colon cancer Neg Hx    Angioedema Neg Hx    Eczema Neg Hx    Immunodeficiency Neg Hx    Urticaria Neg Hx      Current Outpatient Medications:     acetaminophen (TYLENOL) 500 MG tablet, Tylenol Extra Strength, Disp: , Rfl:    albuterol (PROAIR HFA) 108 (90 Base) MCG/ACT inhaler, Inhale 2 puffs into the lungs every 4 (four) hours as needed for wheezing. Or coughing spells.  You may use 2 Puffs 5-10 minutes before exercise., Disp: 1 Inhaler, Rfl: 3   Alpha-D-Galactosidase (BEANO PO), Take 1 tablet by mouth as needed (if eating onion or garlic). , Disp: , Rfl:    AMBULATORY NON FORMULARY MEDICATION, Allergy injections Once a week, Disp: , Rfl:    Ascorbic Acid (VITAMIN C) 1000 MG tablet, Take 1,000 mg by mouth daily., Disp: , Rfl:    ASHWAGANDHA PO, Take 2 capsules by mouth daily., Disp: , Rfl:    Calcium Carb-Cholecalciferol (CALCIUM 500 + D PO), Take 2 tablets by mouth daily at 6 (six) AM., Disp: , Rfl:    chlorpheniramine (CHLOR-TRIMETON) 4 MG tablet, Take 2 mg by mouth 2 (two) times daily. Takes half tablet twice daily, Disp: , Rfl:    cholecalciferol (VITAMIN D3) 25 MCG (1000 UNIT) tablet, Take 1,000 Units by mouth every other day., Disp: , Rfl:    ciclopirox (PENLAC) 8 % solution, See admin instructions., Disp: , Rfl:    dicyclomine (BENTYL) 10 MG capsule, Take 1 capsule by mouth daily as needed., Disp: , Rfl:    diltiazem (CARDIZEM CD) 120 MG 24 hr capsule, Take 1 capsule (120 mg total) by mouth daily., Disp: 90 capsule, Rfl: 1   diphenhydrAMINE-PE-APAP (DELSYM COUGH/COLD NIGHT TIME) 12.5-5-325 MG/10ML LIQD, Take by mouth as needed., Disp: , Rfl:    EPINEPHrine 0.3 mg/0.3 mL IJ SOAJ injection, Inject into the muscle., Disp: , Rfl:    ezetimibe (ZETIA) 10 MG tablet, Take 1 tablet (10 mg total) by mouth daily. Please keep upcoming appt for future refills. Thank you., Disp: 90 tablet, Rfl: 3   fluticasone (FLONASE) 50 MCG/ACT nasal spray, Spray 2 sprays into each nostril every day., Disp: 48 g, Rfl: 1   fluticasone furoate-vilanterol (BREO ELLIPTA) 100-25 MCG/ACT AEPB, Inhale 1 puff into the lungs daily., Disp: 180 each, Rfl: 3   lactase  (LACTAID) 3000 units tablet, Take by mouth., Disp: , Rfl:    LANTUS SOLOSTAR 100 UNIT/ML Solostar Pen, Inject 14 Units into the skin daily., Disp: , Rfl:    levocetirizine (XYZAL) 5 MG tablet, Take 5 mg by mouth at bedtime., Disp: , Rfl:    metFORMIN (GLUCOPHAGE-XR) 500 MG 24 hr tablet, Take 500 mg by mouth 2 (two) times daily with a meal., Disp: , Rfl:    metoprolol succinate (TOPROL-XL) 25 MG 24 hr tablet, Take 1 tablet (25 mg total) by  mouth daily., Disp: 90 tablet, Rfl: 3   montelukast (SINGULAIR) 10 MG tablet, TAKE 1 TABLET BY MOUTH EVERYDAY AT BEDTIME, Disp: 90 tablet, Rfl: 2   Multiple Vitamin (MULTIVITAMIN) tablet, Take 1 tablet by mouth daily., Disp: , Rfl:    nystatin-triamcinolone (MYCOLOG II) cream, nystatin-triamcinolone 100,000 unit/g-0.1 % topical cream, Disp: , Rfl:    omeprazole (PRILOSEC OTC) 20 MG tablet, Take 10 mg by mouth daily., Disp: , Rfl:    OXYGEN, Inhale into the lungs. Up to 6 liters as needed during the day and nite time use on/off, Disp: , Rfl:    pravastatin (PRAVACHOL) 40 MG tablet, Take 1 tablet (40 mg total) by mouth every evening., Disp: 90 tablet, Rfl: 1   predniSONE (DELTASONE) 10 MG tablet, Take 1 tablet (10 mg total) by mouth daily with breakfast. (Patient taking differently: Take 20 mg by mouth daily with breakfast.), Disp: 30 tablet, Rfl: 2   Sodium Chloride, Inhalant, 7 % NEBU, Use per nebulizer once a day to help clear sputum, Disp: 120 mL, Rfl: 3   nitroGLYCERIN (NITROSTAT) 0.4 MG SL tablet, Place 1 tablet (0.4 mg total) under the tongue every 5 (five) minutes as needed for chest pain., Disp: 25 tablet, Rfl: 12  Current Facility-Administered Medications:    methylPREDNISolone acetate (DEPO-MEDROL) injection (RADIOLOGY ONLY) 120 mg, 120 mg, Intramuscular, Once, Meier, Ivor Costa, MD      Objective:   There were no vitals filed for this visit.  Estimated body mass index is 27.07 kg/m as calculated from the following:   Height as of 01/25/24: 5'  (1.524 m).   Weight as of 01/25/24: 138 lb 9.6 oz (62.9 kg).  @WEIGHTCHANGE @  There were no vitals filed for this visit.   Physical Exam   General: No distress. Looks well O2 at rest: no Cane present: no Sitting in wheel chair: no Frail: no Obese: no Neuro: Alert and Oriented x 3. GCS 15. Speech normal Psych: Pleasant       Assessment:       ICD-10-CM   1. Hypersensitivity pneumonitis (HCC)  J67.9     2. Current chronic use of systemic steroids  Z79.52     3. High risk medication use  Z79.899     4. ILD (interstitial lung disease) (HCC)  J84.9          Plan:     Patient Instructions  Hypersensitivity pneumonitis (HCC) ILD (interstitial lung disease) (HCC) Current chronic use of systemic steroids   - severe hyperglycemia due to prednisone  Plan  - refer [name] for cellcept counseling and start  FOllowup  - May appt with PFT   FOLLOWUP Return for may 2025 with PFT, with Dr Marchelle Gearing.    SIGNATURE    Dr. Kalman Shan, M.D., F.C.C.P,  Pulmonary and Critical Care Medicine Staff Physician, [facility] Center Director - Interstitial Lung Disease  Program  Pulmonary Fibrosis [facility] at [facility] [place], [place], [address]  Pager: [phone], If no answer or between  15:00h - 7:00h: call [phone] Telephone: [phone]  5:01 PM 01/31/2024

## 2024-01-31 NOTE — Telephone Encounter (Signed)
     Rhonda [name]  - [name] can you pleae do cellcept counseling and start 500mg  bid . She is worriedk about AE.  -= Recommended cousneling - goals steroid sparing due to bad hyperglcycemiua

## 2024-01-31 NOTE — Patient Instructions (Addendum)
 Hypersensitivity pneumonitis (HCC) ILD (interstitial lung disease) (HCC) Current chronic use of systemic steroids   - severe hyperglycemia due to prednisone  Plan  - refer [name] for cellcept counseling and start  FOllowup  - May appt with PFT

## 2024-02-03 ENCOUNTER — Ambulatory Visit: Admitting: Pharmacist

## 2024-02-03 DIAGNOSIS — Z7952 Long term (current) use of systemic steroids: Secondary | ICD-10-CM | POA: Diagnosis not present

## 2024-02-03 DIAGNOSIS — J679 Hypersensitivity pneumonitis due to unspecified organic dust: Secondary | ICD-10-CM

## 2024-02-03 DIAGNOSIS — Z111 Encounter for screening for respiratory tuberculosis: Secondary | ICD-10-CM

## 2024-02-03 DIAGNOSIS — Z79899 Other long term (current) drug therapy: Secondary | ICD-10-CM

## 2024-02-03 DIAGNOSIS — D84821 Immunodeficiency due to drugs: Secondary | ICD-10-CM | POA: Diagnosis not present

## 2024-02-03 DIAGNOSIS — Z7189 Other specified counseling: Secondary | ICD-10-CM

## 2024-02-03 NOTE — Progress Notes (Addendum)
 Pharmacy Note  Subjective: Patient called today by [facility] pharmacy team for counseling on mycophenolate (CellCept) for  hypersensitivity pneumonitis .  She is currently taking prednisone 15mg  daily. Has had fluctuations in steroid dosage but chronic steroid use is now causing hyperglycemia. Her insulin regimen has been adjusted  She is reluctant to start Cellcept due to risk for lymphoma. Her father died from lymphoma and reports that almost every member on paternal side of family has had or died from cancers  Objective: CBC    Component Value Date/Time   WBC 15.1 (H) 03/18/2023 1402   RBC 5.28 (H) 03/18/2023 1402   HGB 14.8 03/18/2023 1402   HCT 45.8 03/18/2023 1402   PLT 228.0 03/18/2023 1402   MCV 86.8 03/18/2023 1402   MCH 29.5 10/04/2019 0216   MCHC 32.4 03/18/2023 1402   RDW 15.9 (H) 03/18/2023 1402   LYMPHSABS 1.2 03/18/2023 1402   MONOABS 0.6 03/18/2023 1402   EOSABS 0.0 03/18/2023 1402   BASOSABS 0.0 03/18/2023 1402    CMP     Component Value Date/Time   NA 136 03/18/2023 1402   NA 142 12/08/2018 0917   K 4.8 03/18/2023 1402   CL 101 03/18/2023 1402   CO2 27 03/18/2023 1402   GLUCOSE 247 (H) 03/18/2023 1402   BUN 22 03/18/2023 1402   BUN 18 12/08/2018 0917   CREATININE 1.13 03/18/2023 1402   CREATININE 0.91 02/26/2015 1540   CALCIUM 9.3 03/18/2023 1402   PROT 7.0 03/18/2023 1402   PROT 6.5 12/08/2018 0917   ALBUMIN 3.7 03/18/2023 1402   ALBUMIN 3.9 12/08/2018 0917   AST 19 03/18/2023 1402   ALT 22 03/18/2023 1402   ALKPHOS 57 03/18/2023 1402   BILITOT 0.3 03/18/2023 1402   BILITOT 0.4 12/08/2018 0917   GFRNONAA >60 10/04/2019 0216   GFRNONAA 66 02/26/2015 1540   GFRAA >60 10/04/2019 0216   GFRAA 77 02/26/2015 1540    Baseline Immunosuppressant Therapy Labs TB GOLD    Latest Ref Rng & Units 02/12/2023   11:58 AM  Quantiferon TB Gold  Quantiferon TB Gold Plus NEGATIVE NEGATIVE    Hepatitis Panel    Latest Ref Rng & Units 02/08/2014     2:55 PM  Hepatitis  Hep C Ab NEGATIVE NEGATIVE    HIV No results found for: "HIV" Immunoglobulins   SPEP    Latest Ref Rng & Units 03/18/2023    2:02 PM  Serum Protein Electrophoresis  Total Protein 6.0 - 8.3 g/dL 7.0    [id number] Lab Results  Component Value Date   G6PDH 16.7 02/16/2023   TPMT No results found for: "TPMT"   Chest-xray:  03/22/2023 - Chronic interstitial lung disease, unchanged in radiographic appearance. No evidence of superimposed acute abnormality.  Contraception: post-menopausal  Assessment/Plan:  Patient was counseled on the purpose, proper use, and adverse effects of mycophenolate including risk of infection, new or reactivation of viral infections, nausea, and headaches.  Discussed warning of increased risk of development of lymphoma and other malignancies, particularly of the skin.  Discussed risk of neutropenia and discussed importance of regular labs to monitor blood counts, liver function, and kidney function. Counseled patient on importance of taking PCP prophylaxis while on mycophenolate.  Counseled patient on purpose, proper use, and adverse effects of sulfamethoxazole/trimethoprim three times a week. Patient does have sulda allergy noted but reports this was from almost 50 years ago and she recalls it being a headache. We will rechallenge and if serious intolerability or side  effects, will move forward with dapsone 100 mg daily.  Counseled patient on purpose, proper use, and adverse effects of Dapsone.  G6PD wnl on 02/16/2023.  Provided patient with educational materials and answered all questions.  Patient consented to mycophenolate.     Chesley Mires, PharmD, MPH, BCPS, CPP Clinical Pharmacist (Rheumatology and Pulmonology)

## 2024-02-04 ENCOUNTER — Encounter: Payer: Self-pay | Admitting: Internal Medicine

## 2024-02-04 LAB — CBC WITH DIFFERENTIAL/PLATELET
Basophils Absolute: 0.1 10*3/uL (ref 0.0–0.1)
Basophils Relative: 1 % (ref 0.0–3.0)
Eosinophils Absolute: 0 10*3/uL (ref 0.0–0.7)
Eosinophils Relative: 0.3 % (ref 0.0–5.0)
HCT: 42.2 % (ref 36.0–46.0)
Hemoglobin: 13.5 g/dL (ref 12.0–15.0)
Lymphocytes Relative: 13.9 % (ref 12.0–46.0)
Lymphs Abs: 1.6 10*3/uL (ref 0.7–4.0)
MCHC: 31.9 g/dL (ref 30.0–36.0)
MCV: 84.7 fl (ref 78.0–100.0)
Monocytes Absolute: 0.9 10*3/uL (ref 0.1–1.0)
Monocytes Relative: 7.9 % (ref 3.0–12.0)
Neutro Abs: 9.1 10*3/uL — ABNORMAL HIGH (ref 1.4–7.7)
Neutrophils Relative %: 76.9 % (ref 43.0–77.0)
Platelets: 249 10*3/uL (ref 150.0–400.0)
RBC: 4.98 Mil/uL (ref 3.87–5.11)
RDW: 16.2 % — ABNORMAL HIGH (ref 11.5–15.5)
WBC: 11.8 10*3/uL — ABNORMAL HIGH (ref 4.0–10.5)

## 2024-02-07 ENCOUNTER — Other Ambulatory Visit: Payer: Self-pay | Admitting: Pharmacist

## 2024-02-07 DIAGNOSIS — Z79899 Other long term (current) drug therapy: Secondary | ICD-10-CM

## 2024-02-07 DIAGNOSIS — J679 Hypersensitivity pneumonitis due to unspecified organic dust: Secondary | ICD-10-CM

## 2024-02-07 DIAGNOSIS — J849 Interstitial pulmonary disease, unspecified: Secondary | ICD-10-CM

## 2024-02-07 DIAGNOSIS — Z2989 Encounter for other specified prophylactic measures: Secondary | ICD-10-CM

## 2024-02-07 LAB — COMPLETE METABOLIC PANEL WITH GFR
AG Ratio: 1.4 (calc) (ref 1.0–2.5)
ALT: 16 U/L (ref 6–29)
AST: 19 U/L (ref 10–35)
Albumin: 3.9 g/dL (ref 3.6–5.1)
Alkaline phosphatase (APISO): 56 U/L (ref 37–153)
BUN: 23 mg/dL (ref 7–25)
CO2: 28 mmol/L (ref 20–32)
Calcium: 9.5 mg/dL (ref 8.6–10.4)
Chloride: 103 mmol/L (ref 98–110)
Creat: 0.93 mg/dL (ref 0.60–1.00)
Globulin: 2.7 g/dL (ref 1.9–3.7)
Glucose, Bld: 103 mg/dL — ABNORMAL HIGH (ref 65–99)
Potassium: 4.7 mmol/L (ref 3.5–5.3)
Sodium: 138 mmol/L (ref 135–146)
Total Bilirubin: 0.4 mg/dL (ref 0.2–1.2)
Total Protein: 6.6 g/dL (ref 6.1–8.1)
eGFR: 64 mL/min/{1.73_m2} (ref >=60)

## 2024-02-07 LAB — HEPATITIS B SURFACE ANTIGEN: Hepatitis B Surface Ag: NONREACTIVE

## 2024-02-07 LAB — QUANTIFERON-TB GOLD PLUS
Mitogen-NIL: 8.56 [IU]/mL
NIL: 0.04 [IU]/mL
QuantiFERON-TB Gold Plus: NEGATIVE
TB1-NIL: 0.03 [IU]/mL
TB2-NIL: 0.03 [IU]/mL

## 2024-02-07 LAB — HEPATITIS B CORE ANTIBODY, IGM: Hep B C IgM: NONREACTIVE

## 2024-02-07 MED ORDER — SULFAMETHOXAZOLE-TRIMETHOPRIM 800-160 MG PO TABS
1.0000 | ORAL_TABLET | ORAL | 0 refills | Status: DC
Start: 2024-02-07 — End: 2024-03-17

## 2024-02-07 MED ORDER — MYCOPHENOLATE MOFETIL 500 MG PO TABS: ORAL_TABLET | ORAL | 0 refills | Status: DC

## 2024-02-07 NOTE — Progress Notes (Signed)
 Discussed lab results with patient. Labs stable to proceed with Cellcept.  Dose: 500 mg twice daily x 2 weeks. Repeat CBC/CMP. If stable, increase to 1000 mg twice daily. Repeat CBC/CMET. If stable, increase to 1500 mg twice daily. Repeat labs in 2 weeks.   Start Bactrim DS three times weekly after she has been on Cellcept 1000 mg twice daily for at least 2 weeks.  She has been advised to reach out to clinic/seek emergent care if any allergic reaction to Bactrim (rash, hives, trouble swallowing/breathing)  Chesley Mires, PharmD, MPH, BCPS, CPP Clinical Pharmacist (Rheumatology and Pulmonology)

## 2024-02-07 NOTE — Telephone Encounter (Signed)
 All blood work - NORMAL  TB test normal last year and this year   QuantiFERON-TB Gold Plus NEGATIVE NEGATIVE CM  Comment: Negative test result. M. tuberculosis complex infection unlikely.  NIL 0.04 0.04  Mitogen-NIL 8.56 6.67  TB1-NIL 0.03 0.00  TB2-NIL 0.03 0.01 CM  Comment: . The Nil tube value reflects the background interferon gamma immune response of the patient's blood sample.      Latest Reference Range & Units 02/03/24 15:52  Sodium 135 - 146 mmol/L 138  Potassium 3.5 - 5.3 mmol/L 4.7  Chloride 98 - 110 mmol/L 103  CO2 20 - 32 mmol/L 28  Glucose 65 - 99 mg/dL 562 (H)  BUN 7 - 25 mg/dL 23  Creatinine 1.30 - 8.65 mg/dL 7.84  Calcium 8.6 - 69.6 mg/dL 9.5  BUN/Creatinine Ratio 6 - 22 (calc) SEE NOTE:  eGFR > OR = 60 mL/min/1.28m2 64  AG Ratio 1.0 - 2.5 (calc) 1.4  AST 10 - 35 U/L 19  ALT 6 - 29 U/L 16  Total Protein 6.1 - 8.1 g/dL 6.6  Total Bilirubin 0.2 - 1.2 mg/dL 0.4  Alkaline phosphatase (APISO) 37 - 153 U/L 56  Globulin 1.9 - 3.7 g/dL (calc) 2.7  WBC 4.0 - 29.5 K/uL 11.8 (H)  RBC 3.87 - 5.11 Mil/uL 4.98  Hemoglobin 12.0 - 15.0 g/dL 28.4  HCT 13.2 - 44.0 % 42.2  MCV 78.0 - 100.0 fl 84.7  MCHC 30.0 - 36.0 g/dL 10.2  RDW 72.5 - 36.6 % 16.2 (H)  Platelets 150.0 - 400.0 K/uL 249.0  Neutrophils 43.0 - 77.0 % 76.9  Lymphocytes 12.0 - 46.0 % 13.9  Monocytes Relative 3.0 - 12.0 % 7.9  Eosinophil 0.0 - 5.0 % 0.3  Basophil 0.0 - 3.0 % 1.0  NEUT# 1.4 - 7.7 K/uL 9.1 (H)  Lymphs Abs 0.7 - 4.0 K/uL 1.6  Monocyte # 0.1 - 1.0 K/uL 0.9  Eosinophils Absolute 0.0 - 0.7 K/uL 0.0  Basophils Absolute 0.0 - 0.1 K/uL 0.1  Albumin MSPROF 3.6 - 5.1 g/dL 3.9  (H): Data is abnormally high

## 2024-02-10 ENCOUNTER — Other Ambulatory Visit: Payer: Self-pay | Admitting: Cardiology

## 2024-02-14 ENCOUNTER — Ambulatory Visit: Payer: Self-pay

## 2024-02-14 ENCOUNTER — Encounter: Payer: Self-pay | Admitting: Internal Medicine

## 2024-02-14 NOTE — Telephone Encounter (Signed)
 Chief Complaint: SOB Symptoms: productive cough, dizziness, chest tightness, pain in "lung area" Frequency: x few weeks Pertinent Negatives: Patient denies fever, URI sx Disposition: [] ED /[x] Urgent Care (no appt availability in office) / [] Appointment(In office/virtual)/ []  [place] [facility]/ [] Home Care/ [] Refused Recommended Disposition /[] [place] [facility]/ [x]  Follow-up with PCP Additional Notes: Pt c/o SOB x a few weeks. 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 Pt endorses productive white cough, chest tightness/lung pain. Denies fevers, URI sx. Pt has concern for PNA. Pt taking respiratory INH as prescribed, as well as albuterol and O2. Triager attempted to schedule with pulm, but no access. Advised pt to see PCP if able by EOB, or go to UC for further evaluation/tx. Triager will forward encounter for Dr. Marchelle Gearing to review and advise. Patient verbalized understanding and is expecting call back from office for next steps. Triager also advised that if pt does not hear back from office, to follow disposition for further evaluation/treatment.    Reason for Disposition  [1] Longstanding difficulty breathing (e.g., CHF, COPD, emphysema) AND [2] WORSE than normal  Answer Assessment - Initial Assessment Questions E2C2 Pulmonary Triage - Initial Assessment Questions "Chief Complaint (e.g., cough, sob, wheezing, fever, chills, sweat or additional symptoms) *Go to specific symptom protocol after initial questions. Dizziness, chest pain/tightness (concern for PNA), SOB Productive cough - white  Pain in "lung area" - in back and side  Started cellcept on 21 d/t steroid-induced diabetes  "How long have symptoms been present?" Worsening x a few weeks  Have you tested for COVID or Flu? Note: If not, ask patient if a home test can be taken. If so, instruct patient to call back for positive results. No, denies URI sx  MEDICINES:    "Have you used any OTC meds to help with symptoms?" No If yes, ask "What medications?" N/a  "Have you used your inhalers/maintenance medication?" Yes If yes, "What medications?" Breo - 2 puffs once daily Albuterol PRN - 2 puffs last used 1 hour ago - reports relief with coughing   If inhaler, ask "How many puffs and how often?" Note: Review instructions on medication in the chart. See above  OXYGEN: "Do you wear supplemental oxygen?" Yes If yes, "How many liters are you supposed to use?" Starts at 5 L, then lower to 4L Only needed with exertion Typically on RA, but has O2 PRN  "Do you monitor your oxygen levels?" Yes If yes, "What is your reading (oxygen level) today?" 96% on 4L  "What is your usual oxygen saturation reading?"  (Note: Pulmonary O2 sats should be 90% or greater) 90's at rest   1. RESPIRATORY STATUS: "Describe your breathing?" (e.g., wheezing, shortness of breath, unable to speak, severe coughing)      SOB 2. ONSET: "When did this breathing problem begin?"      A few weeks 3. PATTERN "Does the difficult breathing come and go, or has it been constant since it started?"      constant 4. SEVERITY: "How bad is your breathing?" (e.g., mild, moderate, severe)    - MILD: No SOB at rest, mild SOB with walking, speaks normally in sentences, can lie down, no retractions, pulse < 100.    - MODERATE: SOB at rest, SOB with minimal exertion and prefers to sit, cannot lie down flat, speaks in phrases, mild retractions, audible wheezing, pulse 100-120.    - SEVERE: Very SOB at rest, speaks in single words, struggling to breathe, sitting  hunched forward, retractions, pulse > 120      Moderate-severe 5. RECURRENT SYMPTOM: "Have you had difficulty breathing before?" If Yes, ask: "When was the last time?" and "What happened that time?"      abx 6. CARDIAC HISTORY: "Do you have any history of heart disease?" (e.g., heart attack, angina, bypass surgery, angioplasty)      Denies.  Metoprolol for rhythm 7. LUNG HISTORY: "Do you have any history of lung disease?"  (e.g., pulmonary embolus, asthma, emphysema)     ILD 8. CAUSE: "What do you think is causing the breathing problem?"      I dont know 9. OTHER SYMPTOMS: "Do you have any other symptoms? (e.g., dizziness, runny nose, cough, chest pain, fever)     Dizziness, chest tightness, cough Denies fever  Protocols used: Breathing Difficulty-A-AH

## 2024-02-14 NOTE — Telephone Encounter (Signed)
 Please advise on pt email   Lucine, Bilski "[name]"  [facility] Phone Number: [phone]   Saw Dr Hal Hope instead of Urgent Care recommended. . Treated wheezing with albuteral nebulizer. Wheezing stopped. Gave   pred shot   She wants me seen by Dr Marchelle Gearing preferred tomorrow. If that's not possible I'll go back to see Dr Hal Hope. We've treated the wheezing but still have lots of questions about Cellcept started March 21. Extreme dizziness yesterday. Some tingling  and burning in feet. That doesn't seem to be related to blood sugar levels.

## 2024-02-15 NOTE — Telephone Encounter (Signed)
 addressed

## 2024-02-15 NOTE — Telephone Encounter (Signed)
 Called patient   - says weekend got bad with desats. Also had dizziness - now on cellcept since 02/11/24 - PCP Dois Davenport, MD gve her prednisone 77m gand depot medrol at her practice -w as wheezing really bad -> then wheezing stopped at PCP office.  -> now back on baseline prednisoen 20mg  per day = Similar to last April 2024 - she is wondring if the spring allergy or the new air purifier - feeling better - has appt with Rhunette Croft 02/16/24   Noted: prior allergy testing with Dr Aris [place] -> trees and pollen  A Doubt cellcept side effect with wheezing Diizziness could be cellcept   Plan  - keep appt with Rhunette Croft -> will do exertional hypoxemia and examine - needs cbc, bmet, lft 02/16/24 for cellcept safety      SIGNATURE    Dr. Kalman Shan, M.D., F.C.C.P,  Pulmonary and Critical Care Medicine Staff Physician, [facility] Center Director - Interstitial Lung Disease  Program  Pulmonary Fibrosis [facility] at [facility] [place], [place], [address]   Pager: [phone], If no answer  -> Check AMION or Try [phone] Telephone (clinical office): [phone] Telephone (research): [phone]  6:04 PM 02/15/2024

## 2024-02-16 ENCOUNTER — Ambulatory Visit (INDEPENDENT_AMBULATORY_CARE_PROVIDER_SITE_OTHER)

## 2024-02-16 ENCOUNTER — Telehealth: Payer: Self-pay | Admitting: Nurse Practitioner

## 2024-02-16 ENCOUNTER — Encounter: Payer: Self-pay | Admitting: Nurse Practitioner

## 2024-02-16 ENCOUNTER — Ambulatory Visit: Admitting: Nurse Practitioner

## 2024-02-16 VITALS — BP 130/72 | HR 78 | Ht 59.0 in | Wt 140.8 lb

## 2024-02-16 DIAGNOSIS — J209 Acute bronchitis, unspecified: Secondary | ICD-10-CM

## 2024-02-16 DIAGNOSIS — Z794 Long term (current) use of insulin: Secondary | ICD-10-CM

## 2024-02-16 DIAGNOSIS — J301 Allergic rhinitis due to pollen: Secondary | ICD-10-CM | POA: Diagnosis not present

## 2024-02-16 DIAGNOSIS — J849 Interstitial pulmonary disease, unspecified: Secondary | ICD-10-CM | POA: Diagnosis not present

## 2024-02-16 DIAGNOSIS — E1165 Type 2 diabetes mellitus with hyperglycemia: Secondary | ICD-10-CM

## 2024-02-16 DIAGNOSIS — J679 Hypersensitivity pneumonitis due to unspecified organic dust: Secondary | ICD-10-CM | POA: Diagnosis not present

## 2024-02-16 LAB — POCT EXHALED NITRIC OXIDE: FeNO level (ppb): 13

## 2024-02-16 MED ORDER — PREDNISONE 10 MG PO TABS: ORAL_TABLET | ORAL | 0 refills | Status: DC

## 2024-02-16 MED ORDER — BUDESONIDE 0.5 MG/2ML IN SUSP: 0.5000 mg | Freq: Two times a day (BID) | RESPIRATORY_TRACT | 3 refills | Status: DC

## 2024-02-16 NOTE — Telephone Encounter (Signed)
 Clinically improving. Still with some bronchitic sounding cough and bronchospasm on exam. Going to have her increased prednisone for a few days then return to 20 mg daily and add on budesonide nebs. No exertional hypoxia. Labs drawn with PCP Monday were nl. I am getting a CXR to rule out superimposed infection; currently on doxycycline prescribed by PCP so should have appropriate coverage either way. She will return in 2-3 weeks for f/u and repeat labs for CellCept monitoring.

## 2024-02-16 NOTE — Telephone Encounter (Signed)
 Message sent to [facility] about patient needing to be scheduled in 3 weeks but Florentina Addison has nothing available.

## 2024-02-16 NOTE — Patient Instructions (Signed)
 Continue Albuterol inhaler 2 puffs or 3 mL neb every 6 hours as needed for shortness of breath or wheezing. Notify if symptoms persist despite rescue inhaler/neb use.  Continue Breo 1 puff daily. Brush tongue and rinse mouth afterwards Continue flonase nasal spray 2 sprays each nostril daily Continue xyzal 1 tab daily Continue singulair 1 tab daily Continue CellCept 1 tab Twice daily for 2 weeks then increase to 2 tabs Twice daily for 2 weeks. You'll come in for repeat labs at this point. Call before. Once you hear back regarding the labs and if things are stable, we will have you increased to 3 tabs Twice daily  Continue Bactrim Mondays/Wednesdays/Fridays - we will monitor your kidney function on this  -Increase prednisone to 40 mg for 3 days then 30 mg for 3 days then back to 20 mg daily. Keep close eye on your blood sugars  -Start budesonide nebs Twice daily until symptoms improve then you can use as needed for shortness of breathing, cough or wheezing. Brush tongue and rinse mouth afterwards -Complete doxycycline course  Chest x ray today   Follow up in 2-3 weeks with Dr. Marchelle Gearing or Philis Nettle. If symptoms do not improve or worsen, please contact office for sooner follow up or seek emergency care.

## 2024-02-16 NOTE — Progress Notes (Unsigned)
 @Patient  ID: Rhonda [name], female    DOB: [birth year], 73 y.o.   MRN: [id number]  Chief Complaint  Patient presents with   Follow-up    Hypoxia, doe, coughing phlem wheezing. Symptom is a lot better, but not full recovered. Started last week.    Referring provider: Dois Davenport, MD  HPI: 73 year old female, never smoker followed for ILD in the setting of hypersensitivity pneumonitis.She is a patient of Dr. Jane Canary and last seen in office 10/06/2023.  Past medical history significant for significant environmental allergies on immunotherapy, mitral valve prolapse, pulmonary hypertension, CAD, GERD, IBS, depression, HLD, diabetes.  TEST/EVENTS:   10/06/2023: OV with Dr. Marchelle Gearing.  Breathing doing well.  Feels stable.  On chronic prednisone, 20 mg daily.  Failed Ofev due to side effects.  Failed Esbriet due to side effects.  Declined lung transplant referral.  Had declined CellCept May 2024.  High-resolution CT chest from November 2022 to March 2024 without any significant change.  Had an echocardiogram in April 2024 that showed mild elevation of pulmonary artery pressures.  Right heart catheterization was declined due to doing well.  PFT is stable.  Exercise hypoxemia test improved without any supplemental O2 required.  Having side effects with dry skin and hyperglycemia with prednisone.  Not currently on PJP prophylaxis.  02/16/2024: Today - acute Discussed the use of AI scribe software for clinical note transcription with the patient, who gave verbal consent to proceed.  History of Present Illness   Rhonda BINNING "[name]" is a 73 year old female with chronic HP ILD presents with worsening breathing difficulties and cough.    She was last seen in November 2024.  She had sent a message in early March 2025 reporting difficulties with managing her blood sugar and having to decrease her prednisone down to 15 mg from 20 mg daily.  She was still having very high blood sugars and had to  start on insulin therapy.  Dr. Marchelle Gearing recommended that she start CellCept as a way to reduce steroids.  She initiated CellCept therapy around 02/03/2024.  She was scheduled today for acute visit.  She was experiencing worsening breathing difficulties and a productive cough with dark yellow sputum. Her primary care physician administered an 80 mg steroid shot and started her on doxycycline two days ago, 3/24. She was also treated with duoneb in her office. Despite not increasing her oral steroids due to concerns about insulin management, she started feeling better but still experiences airway noises and cough. The cough has started to lessen but still bothersome. Breathing is not quite back to normal but getting better. Still wheezing some. Over the weekend, she required oxygen as her levels dropped into the seventies during activities, which was not alleviated by her usual methods of resting during exertion. Since her visit Monday, she's not had any low O2 levels. No coughing up blood, fevers, or chills. She reports a decreased appetite.  She started taking CellCept on February 11, 2024, and experienced dizziness and blurry vision over the weekend. This was associated with the other breathing issues as well. She had labs at her PCP which were normal.   She has a history of significant environmental allergies and is on allergy shots. She's also on Xyzal, Flonase, and Singulair. She avoids using astelin spray due to it causing coughing fits. She is currently on 20 mg of prednisone for the last week. Prior to this, she had tapered down to 15 mg daily. She notes that April is  a particularly difficult month for her allergies but allergies overall are better with the immunotherapy than they have been in the past.   She uses a [employer] for glucose monitoring but has experienced discrepancies between its readings and finger stick tests. She recently encountered a malfunctioning sensor, which was 'at least  twenty points off' from her blood stick reading. She plans to resume finger stick testing to ensure accuracy. Her sugars on her fingersticks have been stable.     FeNO nl  Allergies  Allergen Reactions   Atorvastatin Other (See Comments)    Leg pain   Betadine [Povidone Iodine] Other (See Comments)    blisters   Codeine Nausea And Vomiting and Other (See Comments)    Other reaction(s): Vomiting   Garlic Diarrhea   Hydrocodone Nausea And Vomiting and Other (See Comments)   Macrobid [Nitrofurantoin] Other (See Comments)   Ofev [Nintedanib] Other (See Comments)    Abdominal pain   Onion Diarrhea   Other Other (See Comments)   Rosuvastatin Other (See Comments)    Leg pain   Shellfish Allergy Nausea And Vomiting   Statins Other (See Comments)    "Leg pain", Other reaction(s): Other (See Comments), Other (See Comments), "Leg pain", Leg pain, Leg pain   Sulfa Antibiotics    Sulfonamide Derivatives Other (See Comments)    headaches    Immunization History  Administered Date(s) Administered   Fluad Quad(high Dose 65+) 07/17/2019   Hepatitis A 05/23/2010   Hepatitis B 06/23/2006   Influenza Split 08/11/2012, 08/14/2017   Influenza Whole 09/08/2011   Influenza, High Dose Seasonal PF 08/24/2016, 07/24/2017, 08/31/2018, 08/15/2020, 09/26/2020, 09/12/2021, 08/24/2022   Influenza,inj,Quad PF,6+ Mos 09/07/2013   Influenza-Unspecified 09/26/2014, 07/27/2015, 08/24/2023   MMR 05/23/2010   Moderna Sars-Covid-2 Vaccination 07/30/2021, 01/01/2022   PFIZER(Purple Top)SARS-COV-2 Vaccination 12/16/2019, 01/08/2020, 07/18/2020, 08/11/2022   Pneumococcal Conjugate-13 03/15/2014   Pneumococcal Polysaccharide-23 01/11/2018, 09/26/2020, 08/26/2021   Respiratory Syncytial Virus Vaccine,Recomb Aduvanted(Arexvy) 07/28/2022   Td 02/22/2003   Tdap 10/28/2012   Zoster Recombinant(Shingrix) 02/15/2017, 05/17/2017   Zoster, Live 12/04/2010    Past Medical History:  Diagnosis Date   Allergy     Arthritis    history spinal stenosis. osteoarthritis right hip   Asthma    Cataract    Coronary artery calcification seen on CAT scan 08/19/2017   >300 on CT scan 08/2017   DDD (degenerative disc disease), thoracic    Depression    DOE (dyspnea on exertion)    a. 04/2010 Lexi MV EF 71%, no ischemia/infarct;     Fatty liver    GERD (gastroesophageal reflux disease)    H/O steroid therapy    Steroid use orally over 4 yrs- for Lung Fibrosis   Heart palpitations 02/28/2015   Helicobacter pylori ab+    Hemorrhoids    Hiatal hernia    High cholesterol    History of chronic bronchitis    as child   History of migraine    History of MRSA infection    Hyperlipidemia, mixed 08/19/2017   Hyperplastic colon polyp 2007   IBS (irritable bowel syndrome)    Inguinal hernia    right   Insulin resistance    past   Interstitial lung disease (HCC)    MVP (mitral valve prolapse)    Posterior mitral valve leaflet with mild MR   Pneumonia    Pneumonitis, hypersensitivity (HCC)    a. 09/2012 s/p Bx - ? 2/2 bird, mold, oil paint exposure ->on steroids, followed by pulm.  PONV (postoperative nausea and vomiting)    Pre-diabetes    takes Metformin   Pulmonary fibrosis (HCC)    Dr. Marchelle Gearing follows- stable at present   PVC (premature ventricular contraction) 08/19/2017   Rapid heart rate    Dr. Mayford Knife follows- last visit Epic note 9'16   Squamous cell carcinoma of skin    Tinnitus, right ear    Vocal cord ulcer     Tobacco History: Social History   Tobacco Use  Smoking Status Never  Smokeless Tobacco Never  Tobacco Comments   pt states she experimented in college   Counseling given: Not Answered Tobacco comments: pt states she experimented in college   Outpatient Medications Prior to Visit  Medication Sig Dispense Refill   acetaminophen (TYLENOL) 500 MG tablet Tylenol Extra Strength     albuterol (PROAIR HFA) 108 (90 Base) MCG/ACT inhaler Inhale 2 puffs into the lungs every 4  (four) hours as needed for wheezing. Or coughing spells.  You may use 2 Puffs 5-10 minutes before exercise. 1 Inhaler 3   Alpha-D-Galactosidase (BEANO PO) Take 1 tablet by mouth as needed (if eating onion or garlic).      AMBULATORY NON FORMULARY MEDICATION Allergy injections Once a week     Ascorbic Acid (VITAMIN C) 1000 MG tablet Take 1,000 mg by mouth daily.     ASHWAGANDHA PO Take 2 capsules by mouth daily.     Calcium Carb-Cholecalciferol (CALCIUM 500 + D PO) Take 2 tablets by mouth daily at 6 (six) AM.     chlorpheniramine (CHLOR-TRIMETON) 4 MG tablet Take 2 mg by mouth 2 (two) times daily. Takes half tablet twice daily     cholecalciferol (VITAMIN D3) 25 MCG (1000 UNIT) tablet Take 1,000 Units by mouth every other day.     ciclopirox (PENLAC) 8 % solution See admin instructions.     Continuous Glucose Sensor (FREESTYLE LIBRE 3 SENSOR) MISC check blood sugar fasting and 2 hours after largest meal     dicyclomine (BENTYL) 10 MG capsule Take 1 capsule by mouth daily as needed.     diltiazem (CARDIZEM CD) 120 MG 24 hr capsule Take 1 capsule (120 mg total) by mouth daily. 90 capsule 1   diphenhydrAMINE-PE-APAP (DELSYM COUGH/COLD NIGHT TIME) 12.5-5-325 MG/10ML LIQD Take by mouth as needed.     EPINEPHrine 0.3 mg/0.3 mL IJ SOAJ injection Inject into the muscle.     ezetimibe (ZETIA) 10 MG tablet Take 1 tablet (10 mg total) by mouth daily. 90 tablet 3   fluticasone (FLONASE) 50 MCG/ACT nasal spray Spray 2 sprays into each nostril every day. 48 g 1   fluticasone furoate-vilanterol (BREO ELLIPTA) 100-25 MCG/ACT AEPB Inhale 1 puff into the lungs daily. 180 each 3   HUMALOG KWIKPEN 100 UNIT/ML KwikPen Taken 9 unit.     lactase (LACTAID) 3000 units tablet Take by mouth.     LANTUS SOLOSTAR 100 UNIT/ML Solostar Pen Inject 14 Units into the skin daily.     levocetirizine (XYZAL) 5 MG tablet Take 5 mg by mouth at bedtime.     metFORMIN (GLUCOPHAGE-XR) 500 MG 24 hr tablet Take 500 mg by mouth 2 (two)  times daily with a meal.     metoprolol succinate (TOPROL-XL) 25 MG 24 hr tablet Take 1 tablet (25 mg total) by mouth daily. 90 tablet 3   montelukast (SINGULAIR) 10 MG tablet TAKE 1 TABLET BY MOUTH EVERYDAY AT BEDTIME 90 tablet 2   Multiple Vitamin (MULTIVITAMIN) tablet Take 1 tablet by mouth  daily.     mycophenolate (CELLCEPT) 500 MG tablet Take 1 tab twice daily x 2 weeks. REPEAT LABS. If stable, increase to 2 tabs twice daily x 2 weeks. REPEAT LABS. If stable, increase to 3 tabs twice daily 252 tablet 0   nystatin-triamcinolone (MYCOLOG II) cream nystatin-triamcinolone 100,000 unit/g-0.1 % topical cream     omeprazole (PRILOSEC OTC) 20 MG tablet Take 10 mg by mouth daily.     OXYGEN Inhale into the lungs. Up to 6 liters as needed during the day and nite time use on/off     pravastatin (PRAVACHOL) 40 MG tablet Take 1 tablet (40 mg total) by mouth every evening. 90 tablet 1   Sodium Chloride, Inhalant, 7 % NEBU Use per nebulizer once a day to help clear sputum 120 mL 3   sulfamethoxazole-trimethoprim (BACTRIM DS) 800-160 MG tablet Take 1 tablet by mouth 3 (three) times a week. (START after completing two weeks of CELLCEPT 1000mg  twice daily ) 36 tablet 0   nitroGLYCERIN (NITROSTAT) 0.4 MG SL tablet Place 1 tablet (0.4 mg total) under the tongue every 5 (five) minutes as needed for chest pain. 25 tablet 12   predniSONE (DELTASONE) 10 MG tablet Take 1 tablet (10 mg total) by mouth daily with breakfast. (Patient taking differently: Take 20 mg by mouth daily with breakfast.) 30 tablet 2   Facility-Administered Medications Prior to Visit  Medication Dose Route Frequency Provider Last Rate Last Admin   methylPREDNISolone acetate (DEPO-MEDROL) injection (RADIOLOGY ONLY) 120 mg  120 mg Intramuscular Once Omar Person, MD         Review of Systems:   Constitutional: No weight loss or gain, night sweats, fevers, chills, or lassitude. +fatigue  HEENT: No headaches, difficulty swallowing,  tooth/dental problems, or sore throat. No sneezing, itching, ear ache +nasal congestion, post nasal drip CV:  No chest pain, orthopnea, PND, swelling in lower extremities, anasarca, palpitations, syncope Resp: +shortness of breath with exertion; productive cough; wheeze. No hemoptysis. No chest wall deformity GI:  No heartburn, indigestion, abdominal pain, nausea, vomiting, diarrhea, change in bowel habits, loss of appetite, bloody stools.  GU: No dysuria, change in color of urine, urgency or frequency.  No flank pain, no hematuria  Skin: No rash, lesions, ulcerations MSK:  No joint pain or swelling.   Neuro: +dizziness (resolved). No gait abnormality  Psych: No depression or anxiety. Mood stable.     Physical Exam:  BP 130/72 (BP Location: Left Arm, Patient Position: Sitting)   Pulse 78   Ht 4\' 11"  (1.499 m)   Wt 140 lb 12.8 oz (63.9 kg)   SpO2 94%   BMI 28.44 kg/m   GEN: Pleasant, interactive, well-kempt; non-toxic and in no acute distress HEENT:  Normocephalic and atraumatic. PERRLA. Sclera white. Nasal turbinates pink, moist and patent bilaterally. No rhinorrhea present. Oropharynx pink and moist, without exudate or edema. No lesions, ulcerations, or postnasal drip.  NECK:  Supple w/ fair ROM. No JVD present. Normal carotid impulses w/o bruits. Thyroid symmetrical with no goiter or nodules palpated. No lymphadenopathy.   CV: RRR, no m/r/g, no peripheral edema. Pulses intact, +2 bilaterally. No cyanosis, pallor or clubbing. PULMONARY:  Unlabored, regular breathing. Scattered wheeze bilaterally A&P. Rhonchorous breath sounds left posterior. No accessory muscle use.  GI: BS present and normoactive. Soft, non-tender to palpation. No organomegaly or masses detected.  MSK: No erythema, warmth or tenderness. Cap refil <2 sec all extrem. No deformities or joint swelling noted.  Neuro: A/Ox3. No focal deficits  noted.   Skin: Warm, no lesions or rashe Psych: Normal affect and behavior.  Judgement and thought content appropriate.     Lab Results:  CBC    Component Value Date/Time   WBC 11.8 (H) 02/03/2024 1552   RBC 4.98 02/03/2024 1552   HGB 13.5 02/03/2024 1552   HCT 42.2 02/03/2024 1552   PLT 249.0 02/03/2024 1552   MCV 84.7 02/03/2024 1552   MCH 29.5 10/04/2019 0216   MCHC 31.9 02/03/2024 1552   RDW 16.2 (H) 02/03/2024 1552   LYMPHSABS 1.6 02/03/2024 1552   MONOABS 0.9 02/03/2024 1552   EOSABS 0.0 02/03/2024 1552   BASOSABS 0.1 02/03/2024 1552    BMET    Component Value Date/Time   NA 138 02/03/2024 1552   NA 142 12/08/2018 0917   K 4.7 02/03/2024 1552   CL 103 02/03/2024 1552   CO2 28 02/03/2024 1552   GLUCOSE 103 (H) 02/03/2024 1552   BUN 23 02/03/2024 1552   BUN 18 12/08/2018 0917   CREATININE 0.93 02/03/2024 1552   CALCIUM 9.5 02/03/2024 1552   GFRNONAA >60 10/04/2019 0216   GFRNONAA 66 02/26/2015 1540   GFRAA >60 10/04/2019 0216   GFRAA 77 02/26/2015 1540    BNP No results found for: "BNP"   Imaging:  DG Chest 2 View Result Date: 02/16/2024 CLINICAL DATA:  Acute bronchitis EXAM: CHEST - 2 VIEW COMPARISON:  03/18/2023, 01/20/2023, chest CT 01/27/2023 FINDINGS: Chronic interstitial lung disease with fibrosis and bronchiectasis. Slight increased ground-glass opacity in the left mid to lower lung compared to prior radiograph. No pleural effusion. Normal cardiac size. No pneumothorax IMPRESSION: Chronic interstitial lung disease with fibrosis and bronchiectasis. Slight increased ground-glass opacity in the left mid to lower lung compared to prior radiograph, possible superimposed pneumonia. Electronically Signed   By: Jasmine Pang M.D.   On: 02/16/2024 17:53    Administration History     None          Latest Ref Rng & Units 10/01/2023    9:49 AM 06/04/2023   10:12 AM 03/08/2023   10:49 AM 12/09/2022   11:48 AM 07/21/2022    9:50 AM 01/06/2022    9:51 AM 10/03/2021    8:59 AM  PFT Results  FVC-Pre L 1.43  1.44  1.45  1.53  1.54   1.58  1.48   FVC-Predicted Pre % 60  60  64  66  67  67  63   Pre FEV1/FVC % % 92  91  93  87  91  90  92   FEV1-Pre L 1.31  1.30  1.35  1.32  1.40  1.43  1.37   FEV1-Predicted Pre % 73  73  80  77  81  81  78   DLCO uncorrected ml/min/mmHg 9.73  10.82  8.11  9.64  8.16  8.62  8.92   DLCO UNC% % 58  64  50  59  50  53  55   DLCO corrected ml/min/mmHg 9.73  10.82  7.81  9.41  7.86  8.62  8.92   DLCO COR %Predicted % 58  64  48  58  48  53  55   DLVA Predicted % 81  85  65  81  64  75  78   TLC L   3.02       TLC % Predicted %   70       RV % Predicted %   70  Lab Results  Component Value Date   NITRICOXIDE 10 01/13/2016        Assessment & Plan:   ILD (interstitial lung disease) ILD in the setting of hypersensitivity pneumonitis, on chronic steroids and recently started on CellCept.  Possible ILD flare.  Will obtain CXR to rule out superimposed infection.  She is slowly improving but still has residual bronchospasm and cough.  Given slow improvement, will have her increase her prednisone and taper back down to 20 mg daily.  Hopefully will be able to decrease daily dosing after she establishes on CellCept.  I will also prescribe budesonide nebs twice daily to hopefully reduce the need for more steroids.  She is relatively responsive to her albuterol and Breo.  Side effect profile reviewed.  Educated on oral hygiene following ICS.  Encouraged her to complete doxycycline course. FENO nl.  Patient Instructions  Continue Albuterol inhaler 2 puffs or 3 mL neb every 6 hours as needed for shortness of breath or wheezing. Notify if symptoms persist despite rescue inhaler/neb use.  Continue Breo 1 puff daily. Brush tongue and rinse mouth afterwards Continue flonase nasal spray 2 sprays each nostril daily Continue xyzal 1 tab daily Continue singulair 1 tab daily Continue CellCept 1 tab Twice daily for 2 weeks then increase to 2 tabs Twice daily for 2 weeks. You'll come in for repeat  labs at this point. Call before. Once you hear back regarding the labs and if things are stable, we will have you increased to 3 tabs Twice daily  Continue Bactrim Mondays/Wednesdays/Fridays - we will monitor your kidney function on this  -Increase prednisone to 40 mg for 3 days then 30 mg for 3 days then back to 20 mg daily. Keep close eye on your blood sugars  -Start budesonide nebs Twice daily until symptoms improve then you can use as needed for shortness of breathing, cough or wheezing. Brush tongue and rinse mouth afterwards -Complete doxycycline course  Chest x ray today   Follow up in 2-3 weeks with Dr. Marchelle Gearing or Philis Nettle. If symptoms do not improve or worsen, please contact office for sooner follow up or seek emergency care.    Allergic rhinitis due to pollen Allergic rhinitis managed with Xyzal, Flonase, Singulair, and allergy shots, which have been beneficial. Discontinued azelastine spray due to coughing fits. Acknowledged allergic component to respiratory issues and plan to continue current allergy management. - Continue Xyzal, Flonase, and Singulair - Continue allergy shots - Avoid azelastine spray due to adverse effects  Diabetes mellitus, type II (HCC) Diabetes management complicated by steroid use for HP, affecting glycemic control. On insulin and monitoring blood glucose levels. Issues with Freestyle Libre accuracy; advised to compare with finger stick tests. Recent BS have been stable - Advise to compare [employer] readings with finger stick tests - Follow up with PCP as scheduled        Advised if symptoms do not improve or worsen, to please contact office for sooner follow up or seek emergency care.   I spent 45 minutes of dedicated to the care of this patient on the date of this encounter to include pre-visit review of records, face-to-face time with the patient discussing conditions above, post visit ordering of testing, clinical documentation with the  electronic health record, making appropriate referrals as documented, and communicating necessary findings to members of the patients care team.  Noemi Chapel, NP 02/18/2024  Pt aware and understands NP's role.

## 2024-02-17 ENCOUNTER — Ambulatory Visit: Payer: Self-pay | Admitting: Nurse Practitioner

## 2024-02-17 ENCOUNTER — Other Ambulatory Visit: Payer: Self-pay | Admitting: Nurse Practitioner

## 2024-02-17 DIAGNOSIS — J189 Pneumonia, unspecified organism: Secondary | ICD-10-CM

## 2024-02-17 MED ORDER — CEFPODOXIME PROXETIL 200 MG PO TABS: 200.0000 mg | ORAL_TABLET | Freq: Two times a day (BID) | ORAL | 0 refills | Status: AC

## 2024-02-17 NOTE — Telephone Encounter (Unsigned)
 Copied from CRM [id number]. Topic: Clinical - Lab/Test Results >> Feb 17, 2024 12:11 PM Renie Ora wrote: Reason for CRM: Patient returning phone call regarding lab results, patient stated she received a phone call from [place].

## 2024-02-17 NOTE — Progress Notes (Signed)
 Called the pt and there was no answer- LMTCB

## 2024-02-17 NOTE — Progress Notes (Signed)
 Small opacity in left mid and lower lung, could be a small pneumonia. She is on the doxycycline which would cover for most of the typical respiratory pathogens but given the severity of her underlying lung disease and immunocompromised status with chronic steroid therapy, I want to add on cefpodoxime 200 mg Twice daily for 7 days. Take with food. The alternative option would be augmentin but typically the cefpodoxime is better tolerated from GI profile and she's relatively sensitive. She should still complete the doxycycline. The combination will give her full coverage. I also want her to hold the CellCept for the next 7 days. Thanks!

## 2024-02-17 NOTE — Telephone Encounter (Signed)
  Chief Complaint: med question/info only Additional Notes: Pt calling re: cefpodoxime 200 mg and if she had PNA. Triager relayed below information regarding medication and CXR. Patient verbalized understanding and to call back with worsening symptoms/questions.   Micheline Maze V, NP 02/17/2024 11:42 AM EDT     Small opacity in left mid and lower lung, could be a small pneumonia. She is on the doxycycline which would cover for most of the typical respiratory pathogens but given the severity of her underlying lung disease and immunocompromised status with chronic steroid therapy, I want to add on cefpodoxime 200 mg Twice daily for 7 days. Take with food. The alternative option would be augmentin but typically the cefpodoxime is better tolerated from GI profile and she's relatively sensitive. She should still complete the doxycycline. The combination will give her full coverage. I also want her to hold the CellCept for the next 7 days. Thanks!     Copied from CRM [id number]. Topic: Clinical - Medication Question >> Feb 17, 2024  4:58 PM Brennan Bailey S wrote: Reason for CRM: patient was prescribed cefpodoxime 200 mg by Dr. Allison Quarry and she would like to speak to the doctor in regards to the importance of the medicine. Please call patient at [phone] Reason for Disposition  Caller has medicine question only, adult not sick, AND triager answers question  Answer Assessment - Initial Assessment Questions 1. NAME of MEDICINE: "What medicine(s) are you calling about?"     Cefpodoxime 200 mg 2. QUESTION: "What is your question?" (e.g., double dose of medicine, side effect)     Pt wondering if she has PNA 3. PRESCRIBER: "Who prescribed the medicine?" Reason: if prescribed by specialist, call should be referred to that group.     Katie Cobb  Protocols used: Medication Question Call-A-AH

## 2024-02-18 ENCOUNTER — Encounter: Payer: Self-pay | Admitting: Nurse Practitioner

## 2024-02-18 DIAGNOSIS — E119 Type 2 diabetes mellitus without complications: Secondary | ICD-10-CM | POA: Insufficient documentation

## 2024-02-18 NOTE — Assessment & Plan Note (Signed)
 ILD in the setting of hypersensitivity pneumonitis, on chronic steroids and recently started on CellCept.  Possible ILD flare.  Will obtain CXR to rule out superimposed infection.  She is slowly improving but still has residual bronchospasm and cough.  Given slow improvement, will have her increase her prednisone and taper back down to 20 mg daily.  Hopefully will be able to decrease daily dosing after she establishes on CellCept.  I will also prescribe budesonide nebs twice daily to hopefully reduce the need for more steroids.  She is relatively responsive to her albuterol and Breo.  Side effect profile reviewed.  Educated on oral hygiene following ICS.  Encouraged her to complete doxycycline course. FENO nl.  Patient Instructions  Continue Albuterol inhaler 2 puffs or 3 mL neb every 6 hours as needed for shortness of breath or wheezing. Notify if symptoms persist despite rescue inhaler/neb use.  Continue Breo 1 puff daily. Brush tongue and rinse mouth afterwards Continue flonase nasal spray 2 sprays each nostril daily Continue xyzal 1 tab daily Continue singulair 1 tab daily Continue CellCept 1 tab Twice daily for 2 weeks then increase to 2 tabs Twice daily for 2 weeks. You'll come in for repeat labs at this point. Call before. Once you hear back regarding the labs and if things are stable, we will have you increased to 3 tabs Twice daily  Continue Bactrim Mondays/Wednesdays/Fridays - we will monitor your kidney function on this  -Increase prednisone to 40 mg for 3 days then 30 mg for 3 days then back to 20 mg daily. Keep close eye on your blood sugars  -Start budesonide nebs Twice daily until symptoms improve then you can use as needed for shortness of breathing, cough or wheezing. Brush tongue and rinse mouth afterwards -Complete doxycycline course  Chest x ray today   Follow up in 2-3 weeks with Dr. Marchelle Gearing or Philis Nettle. If symptoms do not improve or worsen, please contact office for  sooner follow up or seek emergency care.

## 2024-02-18 NOTE — Assessment & Plan Note (Signed)
 Diabetes management complicated by steroid use for HP, affecting glycemic control. On insulin and monitoring blood glucose levels. Issues with Freestyle Libre accuracy; advised to compare with finger stick tests. Recent BS have been stable - Advise to compare Freestyle Libre readings with finger stick tests - Follow up with PCP as scheduled

## 2024-02-18 NOTE — Assessment & Plan Note (Signed)
 Allergic rhinitis managed with Xyzal, Flonase, Singulair, and allergy shots, which have been beneficial. Discontinued azelastine spray due to coughing fits. Acknowledged allergic component to respiratory issues and plan to continue current allergy management. - Continue Xyzal, Flonase, and Singulair - Continue allergy shots - Avoid azelastine spray due to adverse effects

## 2024-02-21 NOTE — Progress Notes (Signed)
 Pt already informed of these results.

## 2024-02-21 NOTE — Telephone Encounter (Signed)
 Patient states that someone has previously called her back in regards to this message. NFN.

## 2024-03-03 ENCOUNTER — Other Ambulatory Visit: Payer: Self-pay | Admitting: Family Medicine

## 2024-03-03 DIAGNOSIS — Z1231 Encounter for screening mammogram for malignant neoplasm of breast: Secondary | ICD-10-CM

## 2024-03-06 ENCOUNTER — Ambulatory Visit (INDEPENDENT_AMBULATORY_CARE_PROVIDER_SITE_OTHER)

## 2024-03-06 ENCOUNTER — Other Ambulatory Visit: Payer: Self-pay | Admitting: Internal Medicine

## 2024-03-06 ENCOUNTER — Encounter: Payer: Self-pay | Admitting: Nurse Practitioner

## 2024-03-06 ENCOUNTER — Other Ambulatory Visit: Payer: Self-pay | Admitting: Nurse Practitioner

## 2024-03-06 ENCOUNTER — Ambulatory Visit: Admitting: Nurse Practitioner

## 2024-03-06 VITALS — BP 120/78 | HR 63 | Ht 59.0 in | Wt 143.4 lb

## 2024-03-06 DIAGNOSIS — R0609 Other forms of dyspnea: Secondary | ICD-10-CM

## 2024-03-06 DIAGNOSIS — J849 Interstitial pulmonary disease, unspecified: Secondary | ICD-10-CM | POA: Diagnosis not present

## 2024-03-06 DIAGNOSIS — J679 Hypersensitivity pneumonitis due to unspecified organic dust: Secondary | ICD-10-CM

## 2024-03-06 DIAGNOSIS — J301 Allergic rhinitis due to pollen: Secondary | ICD-10-CM | POA: Diagnosis not present

## 2024-03-06 DIAGNOSIS — J9611 Chronic respiratory failure with hypoxia: Secondary | ICD-10-CM | POA: Diagnosis not present

## 2024-03-06 DIAGNOSIS — J189 Pneumonia, unspecified organism: Secondary | ICD-10-CM

## 2024-03-06 LAB — CBC WITH DIFFERENTIAL/PLATELET
Basophils Absolute: 0 10*3/uL (ref 0.0–0.1)
Basophils Relative: 0.2 % (ref 0.0–3.0)
Eosinophils Absolute: 0.1 10*3/uL (ref 0.0–0.7)
Eosinophils Relative: 0.9 % (ref 0.0–5.0)
HCT: 43 % (ref 36.0–46.0)
Hemoglobin: 13.9 g/dL (ref 12.0–15.0)
Lymphocytes Relative: 8.1 % — ABNORMAL LOW (ref 12.0–46.0)
Lymphs Abs: 1.3 10*3/uL (ref 0.7–4.0)
MCHC: 32.2 g/dL (ref 30.0–36.0)
MCV: 82.8 fl (ref 78.0–100.0)
Monocytes Absolute: 0.8 10*3/uL (ref 0.1–1.0)
Monocytes Relative: 4.8 % (ref 3.0–12.0)
Neutro Abs: 14.1 10*3/uL — ABNORMAL HIGH (ref 1.4–7.7)
Neutrophils Relative %: 86 % — ABNORMAL HIGH (ref 43.0–77.0)
Platelets: 207 10*3/uL (ref 150.0–400.0)
RBC: 5.2 Mil/uL — ABNORMAL HIGH (ref 3.87–5.11)
RDW: 16.4 % — ABNORMAL HIGH (ref 11.5–15.5)
WBC: 16.4 10*3/uL — ABNORMAL HIGH (ref 4.0–10.5)

## 2024-03-06 LAB — SEDIMENTATION RATE: Sed Rate: 22 mm/h (ref 0–30)

## 2024-03-06 LAB — D-DIMER, QUANTITATIVE: D-Dimer, Quant: 0.41 ug{FEU}/mL (ref <0.50)

## 2024-03-06 LAB — BASIC METABOLIC PANEL WITH GFR
BUN: 25 mg/dL — ABNORMAL HIGH (ref 6–23)
CO2: 31 meq/L (ref 19–32)
Calcium: 9.1 mg/dL (ref 8.4–10.5)
Chloride: 102 meq/L (ref 96–112)
Creatinine, Ser: 0.89 mg/dL (ref 0.40–1.20)
GFR: 63.6 mL/min (ref >60.00)
Glucose, Bld: 154 mg/dL — ABNORMAL HIGH (ref 70–99)
Potassium: 4.5 meq/L (ref 3.5–5.1)
Sodium: 140 meq/L (ref 135–145)

## 2024-03-06 LAB — C-REACTIVE PROTEIN: CRP: <1 mg/dL (ref 0.5–20.0)

## 2024-03-06 LAB — BRAIN NATRIURETIC PEPTIDE: Pro B Natriuretic peptide (BNP): 70 pg/mL (ref 0.0–100.0)

## 2024-03-06 NOTE — Patient Instructions (Addendum)
 Continue Albuterol inhaler 2 puffs or duoneb 3 mL neb every 6 hours as needed for shortness of breath or wheezing. Notify if symptoms persist despite rescue inhaler/neb use. Use duoneb 2-3 times a day  Continue Breo 1 puff daily. Brush tongue and rinse mouth afterwards Continue flonase nasal spray 2 sprays each nostril daily Continue xyzal 1 tab daily Continue singulair 1 tab daily Continue budesonide nebs Twice daily until symptoms improve then you can use as needed for shortness of breathing, cough or wheezing. Brush tongue and rinse mouth afterwards  Hold CellCept until you're seen back  Hold Bactrim Mondays/Wednesdays/Fridays - we will monitor your kidney function on this    -Increase prednisone to 40 mg for 7 days then 30 mg for 7 days then back to 20 mg daily. Keep close eye on your blood sugars  -Complete antibiotics as previously prescribed -Saline nasal rinses 1-2 times a day; use bottled distilled water and saline packets. Follow with flonase 20-30 minutes later -Guaifenesin [phone] Twice daily for cough/congestion (over the counter)  Collect sputum cultures and return    Chest x ray today  Labs today    Follow up in 2 weeks with Dr. Bertrum Brodie or Gina Lagos. Ok to DB [facility] on day not already double booked. If symptoms do not improve or worsen, please contact office for sooner follow up or seek emergency care.

## 2024-03-06 NOTE — Progress Notes (Unsigned)
 @Patient  ID: Rhonda [name], female    DOB: 1950-12-15, 73 y.o.   MRN: [id number]  Chief Complaint  Patient presents with   Follow-up    Coughing, wheezing, sob and  fatigue. No improvement     Referring provider: Dois Davenport, MD  HPI: 73 year old female, never smoker followed for ILD in the setting of hypersensitivity pneumonitis.She is a patient of Dr. Jane Canary and last seen in office 02/16/2024 by [facility] NP.  Past medical history significant for significant environmental allergies on immunotherapy, mitral valve prolapse, pulmonary hypertension, CAD, GERD, IBS, depression, HLD, diabetes.  TEST/EVENTS:  01/2018 serologies negative  01/27/2023 HRCT chest: atherosclerosis. Small hiatal hernia. Moderate pulmonary fibrosis. Areas of btx. Lobular air trapping. Not UIP; chronic fibrotic phase hypersensitivity pneumonitis.  06/04/2023 PFT: FVC 60, FEV1 73, ratio 91, DLCO 64 01/2024 FeNO nl 02/16/2024 CXR: chronic ILD with fibrosis and btx. Increased ground glass opacity in left mid to lower lung  10/06/2023: OV with Dr. Marchelle Gearing.  Breathing doing well.  Feels stable.  On chronic prednisone, 20 mg daily.  Failed Ofev due to side effects.  Failed Esbriet due to side effects.  Declined lung transplant referral.  Had declined CellCept May 2024.  High-resolution CT chest from November 2022 to March 2024 without any significant change.  Had an echocardiogram in April 2024 that showed mild elevation of pulmonary artery pressures.  Right heart catheterization was declined due to doing well.  PFT is stable.  Exercise hypoxemia test improved without any supplemental O2 required.  Having side effects with dry skin and hyperglycemia with prednisone.  Not currently on PJP prophylaxis.  02/16/2024: Sudie Grumbling with Monserat Prestigiacomo NP Discussed the use of AI scribe software for clinical note transcription with the patient, who gave verbal consent to proceed. Rhonda NUSSBAUMER "[name]" is a 73 year old female with chronic HP ILD  presents with worsening breathing difficulties and cough.   She was last seen in November 2024.  She had sent a message in early March 2025 reporting difficulties with managing her blood sugar and having to decrease her prednisone down to 15 mg from 20 mg daily.  She was still having very high blood sugars and had to start on insulin therapy.  Dr. Marchelle Gearing recommended that she start CellCept as a way to reduce steroids.  She initiated CellCept therapy around 02/03/2024. She was scheduled today for acute visit.  She was experiencing worsening breathing difficulties and a productive cough with dark yellow sputum. Her primary care physician administered an 80 mg steroid shot and started her on doxycycline two days ago, 3/24. She was also treated with duoneb in her office. Despite not increasing her oral steroids due to concerns about insulin management, she started feeling better but still experiences airway noises and cough. The cough has started to lessen but still bothersome. Breathing is not quite back to normal but getting better. Still wheezing some. Over the weekend, she required oxygen as her levels dropped into the seventies during activities, which was not alleviated by her usual methods of resting during exertion. Since her visit Monday, she's not had any low O2 levels. No coughing up blood, fevers, or chills. She reports a decreased appetite. She started taking CellCept on February 11, 2024, and experienced dizziness and blurry vision over the weekend. This was associated with the other breathing issues as well. She had labs at her PCP which were normal.  She has a history of significant environmental allergies and is on allergy shots. She's also  on Xyzal, Flonase, and Singulair. She avoids using astelin spray due to it causing coughing fits. She is currently on 20 mg of prednisone for the last week. Prior to this, she had tapered down to 15 mg daily. She notes that April is a particularly difficult month  for her allergies but allergies overall are better with the immunotherapy than they have been in the past.  She uses a [employer] for glucose monitoring but has experienced discrepancies between its readings and finger stick tests. She recently encountered a malfunctioning sensor, which was 'at least twenty points off' from her blood stick reading. She plans to resume finger stick testing to ensure accuracy. Her sugars on her fingersticks have been stable.  FeNO nl  03/06/2024: Today - follow up Discussed the use of AI scribe software for clinical note transcription with the patient, who gave verbal consent to proceed.  History of Present Illness   Rhonda MILBERGER "[name]" is a 73 year old female with chronic lung disease who presents with worsening respiratory symptoms. She was seen 02/16/2024 for acute cough and DOE. She was treated by her PCP with doxycycline. Prednisone taper was initiated at visit. She had a CXR that showed LML and LLL opacity, concerning for pneumonia, so cefpodoxime was added for broader antimicrobial coverage.   She is experiencing a recurrence of respiratory symptoms after completing a course of antibiotics. She was feeling much better but last week around 4/7, her symptoms returned. She went back to Dr. Hal Hope, who restarted her on abx. Currently, she is taking doxycycline and cefpodoxime. Despite this, she continues to experience significant shortness of breath, especially with any sort of distance, and requires supplemental oxygen, which she uses at night and during the day when her oxygen levels are below 90%.   There is a reduction in coughing frequency, but when she does cough, she produces yellow to dark yellow sputum. She experiences dizziness with the initial cough but quickly resolves. No syncope. She denies any fevers, chills, hemoptysis. She uses nebulizers in the morning and evening, which help reduce coughing and wheezing, although they sometimes make her feel  jittery. She is not currently using Mucinex.  Her prednisone dosage was recently reduced to 20 mg, but she increased it back to 30 mg due to symptom recurrence. She has been on prednisone for an extended period. She has had some weight gain of 4 lb and puffiness, which she tells me usually doesn't happen with prednisone use. She has not resumed taking CellCept due to her current illness and has not started Bactrim as she has not been on the CellCept.   No sinus fullness. She does have some nasal drainage. Usually clear but has had small amount of blood tinged mucus when she blows her nose. No significant epistaxis. She does use flonase nasal spray. Can't tolerate astelin as it makes her cough more. She takes xyzal and singulair. She is still on maintenance shots for her allergies.   She uses a Dexcom for blood sugar monitoring and reports better control, with occasional low dips but no extreme highs. The Dexcom works much better than [employer] did.   She is taking care of her granddaughter, which is challenging given her current health condition.       Allergies  Allergen Reactions   Atorvastatin Other (See Comments)    Leg pain   Betadine [Povidone Iodine] Other (See Comments)    blisters   Codeine Nausea And Vomiting and Other (See Comments)  Other reaction(s): Vomiting   Garlic Diarrhea   Hydrocodone Nausea And Vomiting and Other (See Comments)   Macrobid [Nitrofurantoin] Other (See Comments)   Ofev [Nintedanib] Other (See Comments)    Abdominal pain   Onion Diarrhea   Other Other (See Comments)   Rosuvastatin Other (See Comments)    Leg pain   Shellfish Allergy Nausea And Vomiting   Statins Other (See Comments)    "Leg pain", Other reaction(s): Other (See Comments), Other (See Comments), "Leg pain", Leg pain, Leg pain   Sulfa Antibiotics    Sulfonamide Derivatives Other (See Comments)    headaches    Immunization History  Administered Date(s) Administered   Fluad  Quad(high Dose 65+) 07/17/2019   Hepatitis A 05/23/2010   Hepatitis B 06/23/2006   Influenza Split 08/11/2012, 08/14/2017   Influenza Whole 09/08/2011   Influenza, High Dose Seasonal PF 08/24/2016, 07/24/2017, 08/31/2018, 08/15/2020, 09/26/2020, 09/12/2021, 08/24/2022   Influenza,inj,Quad PF,6+ Mos 09/07/2013   Influenza-Unspecified 09/26/2014, 07/27/2015, 08/24/2023   MMR 05/23/2010   Moderna Sars-Covid-2 Vaccination 07/30/2021, 01/01/2022   PFIZER(Purple Top)SARS-COV-2 Vaccination 12/16/2019, 01/08/2020, 07/18/2020, 08/11/2022   Pneumococcal Conjugate-13 03/15/2014   Pneumococcal Polysaccharide-23 01/11/2018, 09/26/2020, 08/26/2021   Respiratory Syncytial Virus Vaccine,Recomb Aduvanted(Arexvy) 07/28/2022   Td 02/22/2003   Tdap 10/28/2012   Zoster Recombinant(Shingrix) 02/15/2017, 05/17/2017   Zoster, Live 12/04/2010    Past Medical History:  Diagnosis Date   Allergy    Arthritis    history spinal stenosis. osteoarthritis right hip   Asthma    Cataract    Coronary artery calcification seen on CAT scan 08/19/2017   >300 on CT scan 08/2017   DDD (degenerative disc disease), thoracic    Depression    DOE (dyspnea on exertion)    a. 04/2010 Lexi MV EF 71%, no ischemia/infarct;     Fatty liver    GERD (gastroesophageal reflux disease)    H/O steroid therapy    Steroid use orally over 4 yrs- for Lung Fibrosis   Heart palpitations 02/28/2015   Helicobacter pylori ab+    Hemorrhoids    Hiatal hernia    High cholesterol    History of chronic bronchitis    as child   History of migraine    History of MRSA infection    Hyperlipidemia, mixed 08/19/2017   Hyperplastic colon polyp 2007   IBS (irritable bowel syndrome)    Inguinal hernia    right   Insulin resistance    past   Interstitial lung disease (HCC)    MVP (mitral valve prolapse)    Posterior mitral valve leaflet with mild MR   Pneumonia    Pneumonitis, hypersensitivity (HCC)    a. 09/2012 s/p Bx - ? 2/2 bird,  mold, oil paint exposure ->on steroids, followed by pulm.   PONV (postoperative nausea and vomiting)    Pre-diabetes    takes Metformin   Pulmonary fibrosis (HCC)    Dr. Bertrum Brodie follows- stable at present   PVC (premature ventricular contraction) 08/19/2017   Rapid heart rate    Dr. Micael Adas follows- last visit Epic note 9'16   Squamous cell carcinoma of skin    Tinnitus, right ear    Vocal cord ulcer     Tobacco History: Social History   Tobacco Use  Smoking Status Never  Smokeless Tobacco Never  Tobacco Comments   pt states she experimented in college   Counseling given: Not Answered Tobacco comments: pt states she experimented in college   Outpatient Medications Prior to Visit  Medication  Sig Dispense Refill   acetaminophen (TYLENOL) 500 MG tablet Tylenol Extra Strength     albuterol (PROAIR HFA) 108 (90 Base) MCG/ACT inhaler Inhale 2 puffs into the lungs every 4 (four) hours as needed for wheezing. Or coughing spells.  You may use 2 Puffs 5-10 minutes before exercise. 1 Inhaler 3   Alpha-D-Galactosidase (BEANO PO) Take 1 tablet by mouth as needed (if eating onion or garlic).      AMBULATORY NON FORMULARY MEDICATION Allergy injections Once a week     Ascorbic Acid (VITAMIN C) 1000 MG tablet Take 1,000 mg by mouth daily.     ASHWAGANDHA PO Take 2 capsules by mouth daily.     budesonide (PULMICORT) 0.5 MG/2ML nebulizer solution Take 2 mLs (0.5 mg total) by nebulization in the morning and at bedtime. 120 mL 3   Calcium Carb-Cholecalciferol (CALCIUM 500 + D PO) Take 2 tablets by mouth daily at 6 (six) AM.     chlorpheniramine (CHLOR-TRIMETON) 4 MG tablet Take 2 mg by mouth 2 (two) times daily. Takes half tablet twice daily     cholecalciferol (VITAMIN D3) 25 MCG (1000 UNIT) tablet Take 1,000 Units by mouth every other day.     ciclopirox (PENLAC) 8 % solution See admin instructions.     Continuous Glucose Sensor (FREESTYLE LIBRE 3 SENSOR) MISC check blood sugar fasting and 2  hours after largest meal     dicyclomine (BENTYL) 10 MG capsule Take 1 capsule by mouth daily as needed.     diltiazem (CARDIZEM CD) 120 MG 24 hr capsule Take 1 capsule (120 mg total) by mouth daily. 90 capsule 1   diphenhydrAMINE-PE-APAP (DELSYM COUGH/COLD NIGHT TIME) 12.5-5-325 MG/10ML LIQD Take by mouth as needed.     doxycycline (VIBRAMYCIN) 100 MG capsule Take 100 mg by mouth every 12 (twelve) hours.     EPINEPHrine 0.3 mg/0.3 mL IJ SOAJ injection Inject into the muscle.     ezetimibe (ZETIA) 10 MG tablet Take 1 tablet (10 mg total) by mouth daily. 90 tablet 3   HUMALOG KWIKPEN 100 UNIT/ML KwikPen Taken 9 unit.     ipratropium-albuterol (DUONEB) 0.5-2.5 (3) MG/3ML SOLN USE ONE VIAL IN NEBULIZER EVERY 6 HOURS AS NEEDED     lactase (LACTAID) 3000 units tablet Take by mouth.     LANTUS SOLOSTAR 100 UNIT/ML Solostar Pen Inject 14 Units into the skin daily.     levocetirizine (XYZAL) 5 MG tablet Take 5 mg by mouth at bedtime.     metFORMIN (GLUCOPHAGE-XR) 500 MG 24 hr tablet Take 500 mg by mouth 2 (two) times daily with a meal.     metoprolol succinate (TOPROL-XL) 25 MG 24 hr tablet Take 1 tablet (25 mg total) by mouth daily. 90 tablet 3   montelukast (SINGULAIR) 10 MG tablet TAKE 1 TABLET BY MOUTH EVERYDAY AT BEDTIME 90 tablet 2   Multiple Vitamin (MULTIVITAMIN) tablet Take 1 tablet by mouth daily.     nystatin-triamcinolone (MYCOLOG II) cream nystatin-triamcinolone 100,000 unit/g-0.1 % topical cream     omeprazole (PRILOSEC OTC) 20 MG tablet Take 10 mg by mouth daily.     OXYGEN Inhale into the lungs. Up to 6 liters as needed during the day and nite time use on/off     pravastatin (PRAVACHOL) 40 MG tablet Take 1 tablet (40 mg total) by mouth every evening. 90 tablet 1   predniSONE (DELTASONE) 10 MG tablet Take 4 tablets for 3 days then 3 tablets for 3 days then return to 20 mg  daily 21 tablet 0   Sodium Chloride, Inhalant, 7 % NEBU Use per nebulizer once a day to help clear sputum 120 mL 3    fluticasone (FLONASE) 50 MCG/ACT nasal spray Spray 2 sprays into each nostril every day. 48 g 1   fluticasone furoate-vilanterol (BREO ELLIPTA) 100-25 MCG/ACT AEPB Inhale 1 puff into the lungs daily. 180 each 3   mycophenolate (CELLCEPT) 500 MG tablet Take 1 tab twice daily x 2 weeks. REPEAT LABS. If stable, increase to 2 tabs twice daily x 2 weeks. REPEAT LABS. If stable, increase to 3 tabs twice daily (Patient not taking: Reported on 03/06/2024) 252 tablet 0   nitroGLYCERIN (NITROSTAT) 0.4 MG SL tablet Place 1 tablet (0.4 mg total) under the tongue every 5 (five) minutes as needed for chest pain. 25 tablet 12   predniSONE (DELTASONE) 10 MG tablet Take 1 tablet (10 mg total) by mouth daily with breakfast. (Patient taking differently: Take 20 mg by mouth daily with breakfast.) 30 tablet 2   sulfamethoxazole-trimethoprim (BACTRIM DS) 800-160 MG tablet Take 1 tablet by mouth 3 (three) times a week. (START after completing two weeks of CELLCEPT 1000mg  twice daily ) (Patient not taking: Reported on 03/06/2024) 36 tablet 0   Facility-Administered Medications Prior to Visit  Medication Dose Route Frequency Provider Last Rate Last Admin   methylPREDNISolone acetate (DEPO-MEDROL) injection (RADIOLOGY ONLY) 120 mg  120 mg Intramuscular Once Omar Person, MD         Review of Systems:   Constitutional: No night sweats, fevers, chills, or lassitude. +fatigue, weight gain HEENT: No headaches, difficulty swallowing, tooth/dental problems, or sore throat. No sneezing, itching, ear ache +nasal congestion, post nasal drip CV:  No chest pain, orthopnea, PND, swelling in lower extremities, anasarca, palpitations, syncope Resp: +shortness of breath with exertion; productive cough; wheeze. No hemoptysis. No chest wall deformity GI:  No heartburn, indigestion, abdominal pain, nausea, vomiting, diarrhea, change in bowel habits, loss of appetite, bloody stools.  GU: No dysuria, change in color of urine, urgency or  frequency.  No flank pain, no hematuria  Skin: No rash, lesions, ulcerations MSK:  No joint pain or swelling.   Neuro:  +dizziness. No gait abnormality, memory impairment  Psych: No depression or anxiety. Mood stable.     Physical Exam:  BP 120/78 (BP Location: Right Arm, Patient Position: Sitting, Cuff Size: Normal)   Pulse 63   Ht 4\' 11"  (1.499 m)   Wt 143 lb 6.4 oz (65 kg)   SpO2 95%   BMI 28.96 kg/m   GEN: Pleasant, interactive, well-kempt; non-toxic and in no acute distress HEENT:  Normocephalic and atraumatic. PERRLA. Sclera white. Nasal turbinates pink, moist and patent bilaterally. No rhinorrhea present. Oropharynx pink and moist, without exudate or edema. No lesions, ulcerations, or postnasal drip.  NECK:  Supple w/ fair ROM. No JVD present. Normal carotid impulses w/o bruits. Thyroid symmetrical with no goiter or nodules palpated. No lymphadenopathy.   CV: RRR, no m/r/g, no peripheral edema. Pulses intact, +2 bilaterally. No cyanosis, pallor or clubbing. PULMONARY:  Unlabored, regular breathing. Scattered rhonchi bilaterally A&P; improved bronchospasm compared to prior. No accessory muscle use.  GI: BS present and normoactive. Soft, non-tender to palpation. No organomegaly or masses detected.  MSK: No erythema, warmth or tenderness. Cap refil <2 sec all extrem. No deformities or joint swelling noted.  Neuro: A/Ox3. No focal deficits noted.   Skin: Warm, no lesions or rashe Psych: Normal affect and behavior. Judgement and thought content appropriate.  Lab Results:  CBC    Component Value Date/Time   WBC 16.4 (H) 03/06/2024 0934   RBC 5.20 (H) 03/06/2024 0934   HGB 13.9 03/06/2024 0934   HCT 43.0 03/06/2024 0934   PLT 207.0 03/06/2024 0934   MCV 82.8 03/06/2024 0934   MCH 29.5 10/04/2019 0216   MCHC 32.2 03/06/2024 0934   RDW 16.4 (H) 03/06/2024 0934   LYMPHSABS 1.3 03/06/2024 0934   MONOABS 0.8 03/06/2024 0934   EOSABS 0.1 03/06/2024 0934   BASOSABS 0.0  03/06/2024 0934    BMET    Component Value Date/Time   NA 140 03/06/2024 0934   NA 142 12/08/2018 0917   K 4.5 03/06/2024 0934   CL 102 03/06/2024 0934   CO2 31 03/06/2024 0934   GLUCOSE 154 (H) 03/06/2024 0934   BUN 25 (H) 03/06/2024 0934   BUN 18 12/08/2018 0917   CREATININE 0.89 03/06/2024 0934   CREATININE 0.93 02/03/2024 1552   CALCIUM 9.1 03/06/2024 0934   GFRNONAA >60 10/04/2019 0216   GFRNONAA 66 02/26/2015 1540   GFRAA >60 10/04/2019 0216   GFRAA 77 02/26/2015 1540    BNP No results found for: "BNP"   Imaging:  DG Chest 2 View Result Date: 03/06/2024 CLINICAL DATA:  Unresolved cough. EXAM: CHEST - 2 VIEW COMPARISON:  02/16/2024 FINDINGS: Similar appearance underlying chronic interstitial lung disease. The ill-defined airspace opacity in the left lung described previously is stable in the interval. No pleural effusion. No pneumothorax. The cardiopericardial silhouette is within normal limits for size. No acute bony abnormality. IMPRESSION: 1. Similar appearance of underlying chronic interstitial lung disease. 2. Stable ill-defined airspace opacity in the left lung. Findings may reflect component of the pulmonary fibrosis although persistent infection could have this appearance. Continued follow-up warranted. Electronically Signed   By: Kennith Center M.D.   On: 03/06/2024 10:39   DG Chest 2 View Result Date: 02/16/2024 CLINICAL DATA:  Acute bronchitis EXAM: CHEST - 2 VIEW COMPARISON:  03/18/2023, 01/20/2023, chest CT 01/27/2023 FINDINGS: Chronic interstitial lung disease with fibrosis and bronchiectasis. Slight increased ground-glass opacity in the left mid to lower lung compared to prior radiograph. No pleural effusion. Normal cardiac size. No pneumothorax IMPRESSION: Chronic interstitial lung disease with fibrosis and bronchiectasis. Slight increased ground-glass opacity in the left mid to lower lung compared to prior radiograph, possible superimposed pneumonia.  Electronically Signed   By: Jasmine Pang M.D.   On: 02/16/2024 17:53    Administration History     None          Latest Ref Rng & Units 10/01/2023    9:49 AM 06/04/2023   10:12 AM 03/08/2023   10:49 AM 12/09/2022   11:48 AM 07/21/2022    9:50 AM 01/06/2022    9:51 AM 10/03/2021    8:59 AM  PFT Results  FVC-Pre L 1.43  1.44  1.45  1.53  1.54  1.58  1.48   FVC-Predicted Pre % 60  60  64  66  67  67  63   Pre FEV1/FVC % % 92  91  93  87  91  90  92   FEV1-Pre L 1.31  1.30  1.35  1.32  1.40  1.43  1.37   FEV1-Predicted Pre % 73  73  80  77  81  81  78   DLCO uncorrected ml/min/mmHg 9.73  10.82  8.11  9.64  8.16  8.62  8.92   DLCO UNC% % 58  64  50  59  50  53  55   DLCO corrected ml/min/mmHg 9.73  10.82  7.81  9.41  7.86  8.62  8.92   DLCO COR %Predicted % 58  64  48  58  48  53  55   DLVA Predicted % 81  85  65  81  64  75  78   TLC L   3.02       TLC % Predicted %   70       RV % Predicted %   70         Lab Results  Component Value Date   NITRICOXIDE 10 01/13/2016        Assessment & Plan:   ILD (interstitial lung disease) ILD in the setting of hypersensitivity pneumonitis, on chronic steroids and recently started on CellCept.  Recent flare with concern for left lung pneumonia, treated with doxycycline and cefpodoxime. Prednisone was also increased. She was clinically improving; however upon completion symptoms recurred. She was restarted on abx by her PCP. Slow to resolve with persistent symptoms today. Overall, her lung exam was improved compared to prior. Will obtain CXR to rule out worsening infection. D dimer to rule out PE; BNP to assess for cardiac component today. Check inflammatory markers. May consider CT imaging for further evaluation. Collect sputum culture, AFB and fungal culture.  Restart slow prednisone taper with close BS monitoring. Hopefully will be able to decrease daily dosing after she establishes on CellCept. Continue aggressive maintenance regimen.  Close follow up. Strict ED precautions.   Patient Instructions  Continue Albuterol inhaler 2 puffs or duoneb 3 mL neb every 6 hours as needed for shortness of breath or wheezing. Notify if symptoms persist despite rescue inhaler/neb use. Use duoneb 2-3 times a day  Continue Breo 1 puff daily. Brush tongue and rinse mouth afterwards Continue flonase nasal spray 2 sprays each nostril daily Continue xyzal 1 tab daily Continue singulair 1 tab daily Continue budesonide nebs Twice daily until symptoms improve then you can use as needed for shortness of breathing, cough or wheezing. Brush tongue and rinse mouth afterwards  Hold CellCept until you're seen back  Hold Bactrim Mondays/Wednesdays/Fridays - we will monitor your kidney function on this    -Increase prednisone to 40 mg for 7 days then 30 mg for 7 days then back to 20 mg daily. Keep close eye on your blood sugars  -Complete antibiotics as previously prescribed -Saline nasal rinses 1-2 times a day; use bottled distilled water and saline packets. Follow with flonase 20-30 minutes later -Guaifenesin [phone] mg Twice daily for cough/congestion (over the counter)  Collect sputum cultures and return    Chest x ray today  Labs today    Follow up in 2 weeks with Dr. Marchelle Gearing or Philis Nettle. Ok to DB [facility] on day not already double booked. If symptoms do not improve or worsen, please contact office for sooner follow up or seek emergency care.    Allergic rhinitis due to pollen Question sinus component as contributing factor to cough/dyspnea. She is on aggressive allergy regimen. Add on intranasal steroid. Keep upcoming appt to establish care with new allergist.   Chronic respiratory failure with hypoxia (HCC) Increased use of oxygen per her report. O2 sats stable on room air in office today. Advised her to continue utilizing for goal >88-90% and to notify of increasing requirements. ED precautions for inability to maintain saturations despite  oxygen therapy.   CAP (community acquired  pneumonia) See above         Advised if symptoms do not improve or worsen, to please contact office for sooner follow up or seek emergency care.   I spent 45 minutes of dedicated to the care of this patient on the date of this encounter to include pre-visit review of records, face-to-face time with the patient discussing conditions above, post visit ordering of testing, clinical documentation with the electronic health record, making appropriate referrals as documented, and communicating necessary findings to members of the patients care team.  Roetta Clarke, NP 03/07/2024  Pt aware and understands NP's role.

## 2024-03-07 ENCOUNTER — Encounter: Payer: Self-pay | Admitting: Nurse Practitioner

## 2024-03-07 ENCOUNTER — Other Ambulatory Visit: Payer: Self-pay | Admitting: Internal Medicine

## 2024-03-07 DIAGNOSIS — J189 Pneumonia, unspecified organism: Secondary | ICD-10-CM | POA: Insufficient documentation

## 2024-03-07 NOTE — Assessment & Plan Note (Signed)
 Increased use of oxygen per her report. O2 sats stable on room air in office today. Advised her to continue utilizing for goal >88-90% and to notify of increasing requirements. ED precautions for inability to maintain saturations despite oxygen therapy.

## 2024-03-07 NOTE — Assessment & Plan Note (Addendum)
 ILD in the setting of hypersensitivity pneumonitis, on chronic steroids and recently started on CellCept.  Recent flare with concern for left lung pneumonia, treated with doxycycline and cefpodoxime. Prednisone was also increased. She was clinically improving; however upon completion symptoms recurred. She was restarted on abx by her PCP. Slow to resolve with persistent symptoms today. Overall, her lung exam was improved compared to prior. Will obtain CXR to rule out worsening infection. D dimer to rule out PE; BNP to assess for cardiac component today. Check inflammatory markers. May consider CT imaging for further evaluation. Collect sputum culture, AFB and fungal culture.  Restart slow prednisone taper with close BS monitoring. Hopefully will be able to decrease daily dosing after she establishes on CellCept. Continue aggressive maintenance regimen. Close follow up. Strict ED precautions.   Patient Instructions  Continue Albuterol inhaler 2 puffs or duoneb 3 mL neb every 6 hours as needed for shortness of breath or wheezing. Notify if symptoms persist despite rescue inhaler/neb use. Use duoneb 2-3 times a day  Continue Breo 1 puff daily. Brush tongue and rinse mouth afterwards Continue flonase nasal spray 2 sprays each nostril daily Continue xyzal 1 tab daily Continue singulair 1 tab daily Continue budesonide nebs Twice daily until symptoms improve then you can use as needed for shortness of breathing, cough or wheezing. Brush tongue and rinse mouth afterwards  Hold CellCept until you're seen back  Hold Bactrim Mondays/Wednesdays/Fridays - we will monitor your kidney function on this    -Increase prednisone to 40 mg for 7 days then 30 mg for 7 days then back to 20 mg daily. Keep close eye on your blood sugars  -Complete antibiotics as previously prescribed -Saline nasal rinses 1-2 times a day; use bottled distilled water and saline packets. Follow with flonase 20-30 minutes later -Guaifenesin  [phone] mg Twice daily for cough/congestion (over the counter)  Collect sputum cultures and return    Chest x ray today  Labs today    Follow up in 2 weeks with Dr. Bertrum Brodie or Gina Lagos. Ok to [facility] on day not already double booked. If symptoms do not improve or worsen, please contact office for sooner follow up or seek emergency care.

## 2024-03-07 NOTE — Assessment & Plan Note (Signed)
 Question sinus component as contributing factor to cough/dyspnea. She is on aggressive allergy regimen. Add on intranasal steroid. Keep upcoming appt to establish care with new allergist.

## 2024-03-07 NOTE — Assessment & Plan Note (Signed)
 See above

## 2024-03-08 ENCOUNTER — Ambulatory Visit
Admission: RE | Admit: 2024-03-08 | Discharge: 2024-03-08 | Disposition: A | Discharge status: Home / Self Care | Source: Ambulatory Visit | Attending: Nurse Practitioner | Admitting: Nurse Practitioner

## 2024-03-08 DIAGNOSIS — J679 Hypersensitivity pneumonitis due to unspecified organic dust: Secondary | ICD-10-CM

## 2024-03-08 DIAGNOSIS — J189 Pneumonia, unspecified organism: Secondary | ICD-10-CM

## 2024-03-08 DIAGNOSIS — J849 Interstitial pulmonary disease, unspecified: Secondary | ICD-10-CM

## 2024-03-08 LAB — RESPIRATORY CULTURE OR RESPIRATORY AND SPUTUM CULTURE: MICRO NUMBER:: 16325821

## 2024-03-09 ENCOUNTER — Other Ambulatory Visit: Payer: Self-pay | Admitting: Nurse Practitioner

## 2024-03-09 DIAGNOSIS — J189 Pneumonia, unspecified organism: Secondary | ICD-10-CM

## 2024-03-09 MED ORDER — LEVOFLOXACIN 500 MG PO TABS
500.0000 mg | ORAL_TABLET | Freq: Every day | ORAL | 0 refills | Status: DC
Start: 2024-03-09 — End: 2024-03-17

## 2024-03-14 ENCOUNTER — Other Ambulatory Visit: Payer: Self-pay

## 2024-03-14 ENCOUNTER — Telehealth: Payer: Self-pay | Admitting: Internal Medicine

## 2024-03-14 ENCOUNTER — Encounter (HOSPITAL_COMMUNITY): Payer: Self-pay

## 2024-03-14 ENCOUNTER — Emergency Department (HOSPITAL_COMMUNITY)

## 2024-03-14 ENCOUNTER — Inpatient Hospital Stay (HOSPITAL_COMMUNITY)
Admission: EM | Admit: 2024-03-14 | Discharge: 2024-03-17 | DRG: 197 | Disposition: A | Discharge status: Home / Self Care | Attending: Internal Medicine | Admitting: Internal Medicine

## 2024-03-14 DIAGNOSIS — Z79899 Other long term (current) drug therapy: Secondary | ICD-10-CM

## 2024-03-14 DIAGNOSIS — J679 Hypersensitivity pneumonitis due to unspecified organic dust: Secondary | ICD-10-CM

## 2024-03-14 DIAGNOSIS — E099 Drug or chemical induced diabetes mellitus without complications: Secondary | ICD-10-CM | POA: Diagnosis present

## 2024-03-14 DIAGNOSIS — Z1152 Encounter for screening for COVID-19: Secondary | ICD-10-CM

## 2024-03-14 DIAGNOSIS — Z833 Family history of diabetes mellitus: Secondary | ICD-10-CM

## 2024-03-14 DIAGNOSIS — Z7984 Long term (current) use of oral hypoglycemic drugs: Secondary | ICD-10-CM

## 2024-03-14 DIAGNOSIS — R0602 Shortness of breath: Secondary | ICD-10-CM | POA: Diagnosis present

## 2024-03-14 DIAGNOSIS — Z885 Allergy status to narcotic agent status: Secondary | ICD-10-CM

## 2024-03-14 DIAGNOSIS — R002 Palpitations: Secondary | ICD-10-CM | POA: Diagnosis present

## 2024-03-14 DIAGNOSIS — Z882 Allergy status to sulfonamides status: Secondary | ICD-10-CM | POA: Diagnosis not present

## 2024-03-14 DIAGNOSIS — J849 Interstitial pulmonary disease, unspecified: Secondary | ICD-10-CM | POA: Diagnosis not present

## 2024-03-14 DIAGNOSIS — R911 Solitary pulmonary nodule: Secondary | ICD-10-CM | POA: Diagnosis present

## 2024-03-14 DIAGNOSIS — Z91018 Allergy to other foods: Secondary | ICD-10-CM

## 2024-03-14 DIAGNOSIS — K589 Irritable bowel syndrome without diarrhea: Secondary | ICD-10-CM | POA: Diagnosis present

## 2024-03-14 DIAGNOSIS — Z794 Long term (current) use of insulin: Secondary | ICD-10-CM

## 2024-03-14 DIAGNOSIS — B9789 Other viral agents as the cause of diseases classified elsewhere: Secondary | ICD-10-CM | POA: Diagnosis present

## 2024-03-14 DIAGNOSIS — Z91013 Allergy to seafood: Secondary | ICD-10-CM | POA: Diagnosis not present

## 2024-03-14 DIAGNOSIS — Z96643 Presence of artificial hip joint, bilateral: Secondary | ICD-10-CM | POA: Diagnosis present

## 2024-03-14 DIAGNOSIS — Z7952 Long term (current) use of systemic steroids: Secondary | ICD-10-CM

## 2024-03-14 DIAGNOSIS — J209 Acute bronchitis, unspecified: Secondary | ICD-10-CM

## 2024-03-14 DIAGNOSIS — Z8249 Family history of ischemic heart disease and other diseases of the circulatory system: Secondary | ICD-10-CM

## 2024-03-14 DIAGNOSIS — J9611 Chronic respiratory failure with hypoxia: Secondary | ICD-10-CM | POA: Diagnosis present

## 2024-03-14 DIAGNOSIS — E782 Mixed hyperlipidemia: Secondary | ICD-10-CM | POA: Diagnosis present

## 2024-03-14 DIAGNOSIS — Z8614 Personal history of Methicillin resistant Staphylococcus aureus infection: Secondary | ICD-10-CM

## 2024-03-14 DIAGNOSIS — Z9981 Dependence on supplemental oxygen: Secondary | ICD-10-CM | POA: Diagnosis not present

## 2024-03-14 DIAGNOSIS — J8410 Pulmonary fibrosis, unspecified: Principal | ICD-10-CM | POA: Diagnosis present

## 2024-03-14 DIAGNOSIS — I272 Pulmonary hypertension, unspecified: Secondary | ICD-10-CM | POA: Diagnosis present

## 2024-03-14 DIAGNOSIS — Z7951 Long term (current) use of inhaled steroids: Secondary | ICD-10-CM | POA: Diagnosis not present

## 2024-03-14 DIAGNOSIS — Z888 Allergy status to other drugs, medicaments and biological substances status: Secondary | ICD-10-CM

## 2024-03-14 DIAGNOSIS — K760 Fatty (change of) liver, not elsewhere classified: Secondary | ICD-10-CM | POA: Diagnosis present

## 2024-03-14 DIAGNOSIS — Z85828 Personal history of other malignant neoplasm of skin: Secondary | ICD-10-CM | POA: Diagnosis not present

## 2024-03-14 DIAGNOSIS — I2510 Atherosclerotic heart disease of native coronary artery without angina pectoris: Secondary | ICD-10-CM | POA: Diagnosis present

## 2024-03-14 DIAGNOSIS — Z881 Allergy status to other antibiotic agents status: Secondary | ICD-10-CM | POA: Diagnosis not present

## 2024-03-14 DIAGNOSIS — Z2989 Encounter for other specified prophylactic measures: Secondary | ICD-10-CM

## 2024-03-14 DIAGNOSIS — T380X5A Adverse effect of glucocorticoids and synthetic analogues, initial encounter: Secondary | ICD-10-CM | POA: Diagnosis present

## 2024-03-14 LAB — COMPREHENSIVE METABOLIC PANEL WITH GFR
ALT: 25 U/L (ref 0–44)
AST: 22 U/L (ref 15–41)
Albumin: 3.4 g/dL — ABNORMAL LOW (ref 3.5–5.0)
Alkaline Phosphatase: 43 U/L (ref 38–126)
Anion gap: 11 (ref 5–15)
BUN: 37 mg/dL — ABNORMAL HIGH (ref 8–23)
CO2: 22 mmol/L (ref 22–32)
Calcium: 9.4 mg/dL (ref 8.9–10.3)
Chloride: 105 mmol/L (ref 98–111)
Creatinine, Ser: 0.82 mg/dL (ref 0.44–1.00)
GFR, Estimated: >60 mL/min (ref >60)
Glucose, Bld: 129 mg/dL — ABNORMAL HIGH (ref 70–99)
Potassium: 4.3 mmol/L (ref 3.5–5.1)
Sodium: 138 mmol/L (ref 135–145)
Total Bilirubin: 0.5 mg/dL (ref 0.0–1.2)
Total Protein: 6.6 g/dL (ref 6.5–8.1)

## 2024-03-14 LAB — CBC WITH DIFFERENTIAL/PLATELET
Abs Immature Granulocytes: 0.2 10*3/uL — ABNORMAL HIGH (ref 0.00–0.07)
Basophils Absolute: 0 10*3/uL (ref 0.0–0.1)
Basophils Relative: 0 %
Eosinophils Absolute: 0 10*3/uL (ref 0.0–0.5)
Eosinophils Relative: 0 %
HCT: 44.1 % (ref 36.0–46.0)
Hemoglobin: 13.5 g/dL (ref 12.0–15.0)
Immature Granulocytes: 1 %
Lymphocytes Relative: 5 %
Lymphs Abs: 1 10*3/uL (ref 0.7–4.0)
MCH: 26.5 pg (ref 26.0–34.0)
MCHC: 30.6 g/dL (ref 30.0–36.0)
MCV: 86.5 fL (ref 80.0–100.0)
Monocytes Absolute: 0.7 10*3/uL (ref 0.1–1.0)
Monocytes Relative: 4 %
Neutro Abs: 17.8 10*3/uL — ABNORMAL HIGH (ref 1.7–7.7)
Neutrophils Relative %: 90 %
Platelets: 230 10*3/uL (ref 150–400)
RBC: 5.1 MIL/uL (ref 3.87–5.11)
RDW: 16.3 % — ABNORMAL HIGH (ref 11.5–15.5)
WBC: 19.7 10*3/uL — ABNORMAL HIGH (ref 4.0–10.5)
nRBC: 0 % (ref 0.0–0.2)

## 2024-03-14 LAB — RESPIRATORY PANEL BY PCR

## 2024-03-14 LAB — GLUCOSE, CAPILLARY
Glucose-Capillary: 136 mg/dL — ABNORMAL HIGH (ref 70–99)
Glucose-Capillary: 144 mg/dL — ABNORMAL HIGH (ref 70–99)

## 2024-03-14 LAB — D-DIMER, QUANTITATIVE: D-Dimer, Quant: <0.27 ug{FEU}/mL (ref 0.00–0.50)

## 2024-03-14 LAB — RESP PANEL BY RT-PCR (RSV, FLU A&B, COVID)  RVPGX2
Influenza A by PCR: NEGATIVE
Influenza B by PCR: NEGATIVE
Resp Syncytial Virus by PCR: NEGATIVE
SARS Coronavirus 2 by RT PCR: NEGATIVE

## 2024-03-14 LAB — PROCALCITONIN: Procalcitonin: <0.1 ng/mL

## 2024-03-14 LAB — BRAIN NATRIURETIC PEPTIDE: B Natriuretic Peptide: 60.8 pg/mL (ref 0.0–100.0)

## 2024-03-14 MED ORDER — ONDANSETRON HCL 4 MG/2ML IJ SOLN: 4.0000 mg | Freq: Four times a day (QID) | INTRAMUSCULAR | Status: DC | PRN

## 2024-03-14 MED ORDER — MONTELUKAST SODIUM 10 MG PO TABS
10.0000 mg | ORAL_TABLET | Freq: Every day | ORAL | Status: DC
Start: 2024-03-14 — End: 2024-03-17
  Administered 2024-03-14 – 2024-03-16 (×3): 10 mg via ORAL
  Filled 2024-03-14 (×4): qty 1

## 2024-03-14 MED ORDER — LORATADINE 10 MG PO TABS
10.0000 mg | ORAL_TABLET | Freq: Every day | ORAL | Status: DC
  Administered 2024-03-14 – 2024-03-16 (×3): 10 mg via ORAL
  Filled 2024-03-14 (×3): qty 1

## 2024-03-14 MED ORDER — SODIUM CHLORIDE 0.9 % IV SOLN
250.0000 mg | Freq: Every day | INTRAVENOUS | Status: AC
  Administered 2024-03-15 – 2024-03-17 (×3): 250 mg via INTRAVENOUS
  Filled 2024-03-14 (×3): qty 250

## 2024-03-14 MED ORDER — FLUTICASONE FUROATE-VILANTEROL 100-25 MCG/ACT IN AEPB
1.0000 | INHALATION_SPRAY | Freq: Every day | RESPIRATORY_TRACT | Status: DC
  Administered 2024-03-15 – 2024-03-17 (×3): 1 via RESPIRATORY_TRACT
  Filled 2024-03-14: qty 28

## 2024-03-14 MED ORDER — TRAZODONE HCL 50 MG PO TABS
25.0000 mg | ORAL_TABLET | Freq: Every evening | ORAL | Status: DC | PRN
  Filled 2024-03-14: qty 1

## 2024-03-14 MED ORDER — PANTOPRAZOLE SODIUM 40 MG PO TBEC
40.0000 mg | DELAYED_RELEASE_TABLET | Freq: Every day | ORAL | Status: DC
  Administered 2024-03-15 – 2024-03-16 (×2): 40 mg via ORAL
  Filled 2024-03-14 (×3): qty 1

## 2024-03-14 MED ORDER — ONDANSETRON HCL 4 MG PO TABS: 4.0000 mg | ORAL_TABLET | Freq: Four times a day (QID) | ORAL | Status: DC | PRN

## 2024-03-14 MED ORDER — OMEPRAZOLE MAGNESIUM 20 MG PO TBEC: 20.0000 mg | DELAYED_RELEASE_TABLET | Freq: Every day | ORAL | Status: DC

## 2024-03-14 MED ORDER — IPRATROPIUM-ALBUTEROL 0.5-2.5 (3) MG/3ML IN SOLN
3.0000 mL | Freq: Once | RESPIRATORY_TRACT | Status: AC
  Administered 2024-03-14: 3 mL via RESPIRATORY_TRACT
  Filled 2024-03-14: qty 3

## 2024-03-14 MED ORDER — INSULIN ASPART 100 UNIT/ML IJ SOLN
8.0000 [IU] | Freq: Three times a day (TID) | INTRAMUSCULAR | Status: DC
  Administered 2024-03-14 – 2024-03-17 (×8): 8 [IU] via SUBCUTANEOUS
  Filled 2024-03-14: qty 0.08

## 2024-03-14 MED ORDER — ACETAMINOPHEN 325 MG PO TABS: 650.0000 mg | ORAL_TABLET | Freq: Four times a day (QID) | ORAL | Status: DC | PRN

## 2024-03-14 MED ORDER — INSULIN LISPRO (1 UNIT DIAL) 100 UNIT/ML (KWIKPEN): 8.0000 [IU] | PEN_INJECTOR | Freq: Three times a day (TID) | SUBCUTANEOUS | Status: DC

## 2024-03-14 MED ORDER — IPRATROPIUM-ALBUTEROL 0.5-2.5 (3) MG/3ML IN SOLN: 3.0000 mL | RESPIRATORY_TRACT | Status: DC | PRN

## 2024-03-14 MED ORDER — GUAIFENESIN ER 600 MG PO TB12
600.0000 mg | ORAL_TABLET | Freq: Two times a day (BID) | ORAL | Status: DC | PRN
  Filled 2024-03-14: qty 1

## 2024-03-14 MED ORDER — ACETAMINOPHEN 650 MG RE SUPP: 650.0000 mg | Freq: Four times a day (QID) | RECTAL | Status: DC | PRN

## 2024-03-14 MED ORDER — METOPROLOL SUCCINATE ER 25 MG PO TB24
25.0000 mg | ORAL_TABLET | Freq: Every day | ORAL | Status: DC
  Administered 2024-03-15 – 2024-03-17 (×3): 25 mg via ORAL
  Filled 2024-03-14 (×3): qty 1

## 2024-03-14 MED ORDER — ENOXAPARIN SODIUM 40 MG/0.4ML IJ SOSY
40.0000 mg | PREFILLED_SYRINGE | INTRAMUSCULAR | Status: DC
  Administered 2024-03-14 – 2024-03-15 (×2): 40 mg via SUBCUTANEOUS
  Filled 2024-03-14 (×3): qty 0.4

## 2024-03-14 MED ORDER — INSULIN GLARGINE-YFGN 100 UNIT/ML ~~LOC~~ SOLN
16.0000 [IU] | Freq: Every day | SUBCUTANEOUS | Status: DC
  Administered 2024-03-15 – 2024-03-17 (×3): 16 [IU] via SUBCUTANEOUS
  Filled 2024-03-14 (×3): qty 0.16

## 2024-03-14 MED ORDER — BUDESONIDE 0.5 MG/2ML IN SUSP
0.5000 mg | Freq: Two times a day (BID) | RESPIRATORY_TRACT | Status: DC
  Administered 2024-03-14 – 2024-03-17 (×6): 0.5 mg via RESPIRATORY_TRACT
  Filled 2024-03-14 (×6): qty 2

## 2024-03-14 MED ORDER — DILTIAZEM HCL ER COATED BEADS 120 MG PO CP24
120.0000 mg | ORAL_CAPSULE | Freq: Every day | ORAL | Status: DC
  Administered 2024-03-15 – 2024-03-17 (×3): 120 mg via ORAL
  Filled 2024-03-14 (×3): qty 1

## 2024-03-14 MED ORDER — EZETIMIBE 10 MG PO TABS
10.0000 mg | ORAL_TABLET | Freq: Every day | ORAL | Status: DC
  Administered 2024-03-14 – 2024-03-16 (×3): 10 mg via ORAL
  Filled 2024-03-14 (×4): qty 1

## 2024-03-14 MED ORDER — INSULIN ASPART 100 UNIT/ML IJ SOLN
0.0000 [IU] | Freq: Every day | INTRAMUSCULAR | Status: DC
  Administered 2024-03-16: 3 [IU] via SUBCUTANEOUS
  Filled 2024-03-14: qty 0.05

## 2024-03-14 MED ORDER — PREDNISONE 10 MG PO TABS: 30.0000 mg | ORAL_TABLET | Freq: Every day | ORAL | Status: DC

## 2024-03-14 MED ORDER — INSULIN ASPART 100 UNIT/ML IJ SOLN
0.0000 [IU] | Freq: Three times a day (TID) | INTRAMUSCULAR | Status: DC
  Administered 2024-03-14: 2 [IU] via SUBCUTANEOUS
  Administered 2024-03-16: 3 [IU] via SUBCUTANEOUS
  Administered 2024-03-16: 2 [IU] via SUBCUTANEOUS
  Administered 2024-03-17: 3 [IU] via SUBCUTANEOUS
  Filled 2024-03-14: qty 0.15

## 2024-03-14 MED ORDER — PRAVASTATIN SODIUM 40 MG PO TABS
40.0000 mg | ORAL_TABLET | Freq: Every day | ORAL | Status: DC
Start: 2024-03-14 — End: 2024-03-17
  Administered 2024-03-14 – 2024-03-16 (×3): 40 mg via ORAL
  Filled 2024-03-14 (×3): qty 1

## 2024-03-14 NOTE — Telephone Encounter (Signed)
 Dr. Bertrum Brodie advised patient to go to the ED. If she is not admitted, I will see her tomorrow in [place]

## 2024-03-14 NOTE — H&P (Signed)
 History and Physical  Rhonda [name] ZOX:[id number] DOB: 07-Jan-1951 DOA: 03/14/2024  PCP: Allene Ivan, MD   Chief Complaint: Dyspnea  HPI: Rhonda [name] is a 73 y.o. female with medical history significant for interstitial lung disease on chronic prednisone , pulmonary hypertension, IBS, steroid-induced DM, palpitations on Cardizem  being admitted to the hospital with continued acute hypoxic respiratory failure.  It seems that for the past month or so, the patient is in persistent cough and dyspnea on exertion, this occurred soon after she started CellCept  in an effort to reduce her steroid dosing.  CellCept  was quickly discontinued, however her symptoms persisted, she was treated with a course of Doxy plus increased prednisone , later a course of cefpodoxime  as well as Bactrim .  Prednisone  was increased up to as high as 30 mg daily and fortunately her symptoms have persisted.  She is followed up closely in pulmonary clinic, most recently 4/14, where steroids were increased however she did not notice any significant change.  CT chest was done, which she was started on Levaquin  and was noticed to have a left lower lobe nodule which is new.  In the interim, BNP and D-dimer were noted to be negative.  Patient failed to improve, has been in touch with her pulmonology team today and they recommended that she come to the emergency department for evaluation and admission in particular due to patient mentioning that she was desaturating to 81% with minimal exertion, though she was stable on room air at rest.  The emergency department, workup is benign as noted below, she was seen in consultation by inpatient pulmonary team who has made recommendations as below.  Review of Systems: Please see HPI for pertinent positives and negatives. A complete 10 system review of systems are otherwise negative.  Past Medical History:  Diagnosis Date   Allergy    Arthritis    history spinal stenosis. osteoarthritis  right hip   Asthma    Cataract    Coronary artery calcification seen on CAT scan 08/19/2017   >300 on CT scan 08/2017   DDD (degenerative disc disease), thoracic    Depression    DOE (dyspnea on exertion)    a. 04/2010 Lexi MV EF 71%, no ischemia/infarct;     Fatty liver    GERD (gastroesophageal reflux disease)    H/O steroid therapy    Steroid use orally over 4 yrs- for Lung Fibrosis   Heart palpitations 02/28/2015   Helicobacter pylori ab+    Hemorrhoids    Hiatal hernia    High cholesterol    History of chronic bronchitis    as child   History of migraine    History of MRSA infection    Hyperlipidemia, mixed 08/19/2017   Hyperplastic colon polyp 2007   IBS (irritable bowel syndrome)    Inguinal hernia    right   Insulin  resistance    past   Interstitial lung disease (HCC)    MVP (mitral valve prolapse)    Posterior mitral valve leaflet with mild MR   Pneumonia    Pneumonitis, hypersensitivity (HCC)    a. 09/2012 s/p Bx - ? 2/2 bird, mold, oil paint exposure ->on steroids, followed by pulm.   PONV (postoperative nausea and vomiting)    Pre-diabetes    takes Metformin    Pulmonary fibrosis (HCC)    Dr. Bertrum Brodie follows- stable at present   PVC (premature ventricular contraction) 08/19/2017   Rapid heart rate    Dr. Micael Adas follows- last visit Epic note  9'16   Squamous cell carcinoma of skin    Tinnitus, right ear    Vocal cord ulcer    Past Surgical History:  Procedure Laterality Date   42 HOUR PH STUDY N/A 02/21/2018   Procedure: 24 HOUR PH STUDY;  Surgeon: Sergio Dandy, MD;  Location: [facility] ENDOSCOPY;  Service: Endoscopy;  Laterality: N/A;   BREAST BIOPSY Right 2009   BIOPSY, pt denies   BREAST BIOPSY Left 2003   Benign    CATARACT EXTRACTION Left    CESAREAN SECTION     COLONOSCOPY     ESOPHAGEAL MANOMETRY N/A 02/21/2018   Procedure: ESOPHAGEAL MANOMETRY (EM);  Surgeon: Sergio Dandy, MD;  Location: [facility] END[facility];  Service: Endoscopy;  Laterality:  N/A;   FOOT FRACTURE SURGERY  2006 or 2007   right   HYMENECTOMY     LUNG BIOPSY  09/28/2012   Procedure: LUNG BIOPSY;  Surgeon: Zelphia Higashi, MD;  Location: [facility] OR;  Service: Thoracic;  Laterality: N/A;  lung biopsies tims three   SQUAMOUS CELL CARCINOMA EXCISION Left    left arm   TOTAL HIP ARTHROPLASTY Right 09/10/2015   Procedure: RIGHT TOTAL HIP ARTHROPLASTY ANTERIOR APPROACH;  Surgeon: Claiborne Crew, MD;  Location: [facility] ORS;  Service: Orthopedics;  Laterality: Right;   TOTAL HIP ARTHROPLASTY Left 10/03/2019   Procedure: TOTAL HIP ARTHROPLASTY ANTERIOR APPROACH;  Surgeon: Claiborne Crew, MD;  Location: [facility] ORS;  Service: Orthopedics;  Laterality: Left;  70 mins   TUBAL LIGATION     UPPER GI ENDOSCOPY     VIDEO ASSISTED THORACOSCOPY  09/28/2012   Procedure: VIDEO ASSISTED THORACOSCOPY;  Surgeon: Zelphia Higashi, MD;  Location: [facility] OR;  Service: Thoracic;  Laterality: Right;   VIDEO BRONCHOSCOPY  11/19/2011   Procedure: VIDEO BRONCHOSCOPY WITH FLUORO;  Surgeon: Maire Scot, MD;  Location: [facility] ENDOSCOPY;  Service: Endoscopy;;   Social History:  reports that she has never smoked. She has never used smokeless tobacco. She reports current alcohol use of about 2.0 standard drinks of alcohol per week. She reports that she does not use drugs.  Allergies  Allergen Reactions   Ozempic (0.25 Or 0.5 Mg-Dose) [Semaglutide(0.25 Or 0.5mg -Dos)] Other (See Comments)    "Paralyzed the stomach"   Tape Other (See Comments)    SKIN IS VERY FRAGILE!! "Burns and pulls up" the skin!! SKIN BRUISES and TEARS EASILY.Aaron Aas   Atorvastatin Other (See Comments)    Leg pain   Betadine [Povidone Iodine] Other (See Comments)    Blisters    Codeine Nausea And Vomiting   Garlic Diarrhea   Hydrocodone  Nausea And Vomiting   Macrobid [Nitrofurantoin] Other (See Comments)   Ofev  [Nintedanib] Other (See Comments)    Abdominal pain   Onion Diarrhea   Rosuvastatin Other (See Comments)    Leg pain   Shellfish  Allergy Nausea And Vomiting   Statins Other (See Comments)    "Leg pain," but Pravachol  is tolerated   Sulfa  Antibiotics Other (See Comments)    Headaches     Family History  Problem Relation Age of Onset   Asthma Mother    Osteoarthritis Mother    Dementia Mother 22   Lymphoma Father    Diabetes Father    Hypertension Sister    Heart disease Sister    Kidney disease Sister    Allergic rhinitis Daughter    Ovarian cancer Maternal Aunt    Breast cancer Paternal Aunt    Breast cancer Paternal Aunt    Emphysema Maternal  Grandmother    Asthma Maternal Grandmother    Lung disease Maternal Grandfather    Bone cancer Paternal Grandfather    Breast cancer Cousin    Breast cancer Cousin    Breast cancer Cousin    Colon cancer Neg Hx    Angioedema Neg Hx    Eczema Neg Hx    Immunodeficiency Neg Hx    Urticaria Neg Hx      Prior to Admission medications   Medication Sig Start Date End Date Taking? Authorizing Provider  acetaminophen  (TYLENOL ) 500 MG tablet Take 500 mg by mouth every 6 (six) hours as needed for mild pain (pain score 1-3) or headache. 08/24/19  Yes [provider]  albuterol  (PROAIR  HFA) 108 (90 Base) MCG/ACT inhaler Inhale 2 puffs into the lungs every 4 (four) hours as needed for wheezing. Or coughing spells.  You may use 2 Puffs 5-10 minutes before exercise. Patient taking differently: Inhale 2 puffs into the lungs every 4 (four) hours as needed for wheezing or shortness of breath (or coughing spells and can also be used 5-10 minutes before exercise). 04/25/18  Yes Ambs, Jeanmarie Millet, FNP  Alpha-D-Galactosidase (BEANO PO) Take 1 tablet by mouth as needed (as directed, if eating onion or garlic).   Yes [provider]  AMBULATORY NON FORMULARY MEDICATION See admin instructions. Allergy injections on Mondays and hold when sick   Yes [provider]  ascorbic acid (VITAMIN C) 500 MG tablet Take 500 mg by mouth daily.   Yes [provider]   ASHWAGANDHA PO Take 1 capsule by mouth every other day.   Yes [provider]  BREO ELLIPTA  100-25 MCG/ACT AEPB Inhale 1 puff into the lungs daily. 03/07/24  Yes Raejean Bullock, NP  budesonide  (PULMICORT ) 0.5 MG/2ML nebulizer solution Take 2 mLs (0.5 mg total) by nebulization in the morning and at bedtime. 02/16/24  Yes Cobb, Mariah Shines, NP  Calcium  Carb-Cholecalciferol (CALCIUM  500 + D PO) Take 2 tablets by mouth See admin instructions. Chew 2 gummies by mouth once a day   Yes [provider]  chlorpheniramine (CHLOR-TRIMETON) 4 MG tablet Take 2 mg by mouth 2 (two) times daily as needed for allergies or rhinitis.   Yes [provider]  cholecalciferol (VITAMIN D3) 25 MCG (1000 UNIT) tablet Take 1,000 Units by mouth every other day.   Yes [provider]  Continuous Glucose Sensor (DEXCOM G7 SENSOR) MISC Inject 1 Device into the skin See admin instructions. Place one new sensor into the skin every 10 days   Yes [provider]  dicyclomine  (BENTYL ) 10 MG capsule Take 10 mg by mouth daily as needed for spasms. 08/16/18  Yes [provider]  diltiazem  (CARDIZEM  CD) 120 MG 24 hr capsule Take 1 capsule (120 mg total) by mouth daily. 09/28/23  Yes Eilleen Grates, MD  diphenhydrAMINE -PE-APAP (DELSYM COUGH/COLD NIGHT TIME) 12.5-5-325 MG/10ML LIQD Take 10 mLs by mouth every 12 (twelve) hours as needed (for coughing).   Yes [provider]  EPINEPHrine  0.3 mg/0.3 mL IJ SOAJ injection Inject 0.3 mg into the muscle as needed for anaphylaxis.   Yes [provider]  ezetimibe  (ZETIA ) 10 MG tablet Take 1 tablet (10 mg total) by mouth daily. Patient taking differently: Take 10 mg by mouth at bedtime. 02/10/24  Yes Eilleen Grates, MD  fluticasone  (FLONASE ) 50 MCG/ACT nasal spray Spray 2 sprays into each nostril every day. Patient taking differently: Place 2 sprays into both nostrils in the morning. 03/06/24  Yes Maire Scot, MD  HUMALOG   KWIKPEN 100 UNIT/ML KwikPen Inject 8 Units into the skin 3 (three) times daily before meals. Taken 9 unit. 01/27/24  Yes [provider]  Hypromellose (NATURES TEARS OP) Place 1 drop into both eyes in the morning and at bedtime.   Yes [provider]  ipratropium-albuterol  (DUONEB) 0.5-2.5 (3) MG/3ML SOLN Take 3 mLs by nebulization every 6 (six) hours as needed (for shortness of breath or wheezing or when sick). 02/14/24  Yes [provider]  LANTUS  SOLOSTAR 100 UNIT/ML Solostar Pen Inject 16 Units into the skin daily before breakfast. 01/03/24  Yes [provider]  levocetirizine (XYZAL ) 5 MG tablet Take 5 mg by mouth at bedtime. 09/26/20  Yes [provider]  levofloxacin  (LEVAQUIN ) 500 MG tablet Take 1 tablet (500 mg total) by mouth daily for 10 days. 03/09/24 03/19/24 Yes Cobb, Mariah Shines, NP  metFORMIN  (GLUCOPHAGE -XR) 500 MG 24 hr tablet Take 500 mg by mouth daily with breakfast. 07/08/23  Yes [provider]  metoprolol  succinate (TOPROL -XL) 25 MG 24 hr tablet Take 1 tablet (25 mg total) by mouth daily. 06/10/23  Yes Eilleen Grates, MD  montelukast  (SINGULAIR ) 10 MG tablet TAKE 1 TABLET BY MOUTH EVERYDAY AT BEDTIME Patient taking differently: Take 10 mg by mouth at bedtime. 01/29/20  Yes Maire Scot, MD  Multiple Vitamin (MULTIVITAMIN) tablet Take 1 tablet by mouth daily with breakfast.   Yes [provider]  nitroGLYCERIN  (NITROSTAT ) 0.4 MG SL tablet Place 1 tablet (0.4 mg total) under the tongue every 5 (five) minutes as needed for chest pain. 12/06/18 03/14/24 Yes Kroeger, Krista M., PA-C  omeprazole  (PRILOSEC  OTC) 20 MG tablet Take 10 mg by mouth daily before breakfast.   Yes [provider]  OXYGEN  Inhale 4-5 L/min into the lungs See admin instructions. Inhale 4-5 L/min at bedtime and as needed for shortness of breath during the day   Yes [provider]  pravastatin  (PRAVACHOL ) 40 MG tablet Take 1 tablet (40 mg  total) by mouth every evening. Patient taking differently: Take 40 mg by mouth at bedtime. 06/12/20  Yes Eilleen Grates, MD  predniSONE  (DELTASONE ) 10 MG tablet Take 4 tablets for 3 days then 3 tablets for 3 days then return to 20 mg daily Patient taking differently: Take 20-30 mg by mouth See admin instructions. Beginning on 03/14/2024, take 30 mg by mouth in the morning with breakfast for 7 days, then decrease to 20 mg a day thereafter 02/16/24  Yes Cobb, Mariah Shines, NP  sodium chloride  (OCEAN) 0.65 % SOLN nasal spray Place 1 spray into both nostrils as needed for congestion.   Yes [provider]  mycophenolate  (CELLCEPT ) 500 MG tablet Take 1 tab twice daily x 2 weeks. REPEAT LABS. If stable, increase to 2 tabs twice daily x 2 weeks. REPEAT LABS. If stable, increase to 3 tabs twice daily Patient not taking: Reported on 03/14/2024 02/07/24   Maire Scot, MD  predniSONE  (DELTASONE ) 10 MG tablet Take 1 tablet (10 mg total) by mouth daily with breakfast. Patient not taking: Reported on 03/14/2024 06/08/23   Maire Scot, MD  Sodium Chloride , Inhalant, 7 % NEBU Use per nebulizer once a day to help clear sputum Patient not taking: Reported on 03/14/2024 07/08/21   Maire Scot, MD  sulfamethoxazole -trimethoprim  (BACTRIM  DS) 800-160 MG tablet Take 1 tablet by mouth 3 (three) times a week. (START after completing two weeks of CELLCEPT  1000mg  twice daily ) Patient not taking: Reported on 03/14/2024 02/07/24  Maire Scot, MD    Physical Exam: BP (!) 143/74   Pulse 72   Temp 97.9 F (36.6 C) (Oral)   Resp (!) 22   Ht 4\' 11"  (1.499 m)   Wt 64 kg   SpO2 98%   BMI 28.48 kg/m  General:  Alert, oriented, calm, in no acute distress, resting comfortably on room air.  Her husband is at the bedside.  Speaking in full sentences, however noticeably dyspneic. Cardiovascular: RRR, no murmurs or rubs, no peripheral edema  Respiratory: clear to auscultation bilaterally, no wheezes, no  crackles  Abdomen: soft, nontender, nondistended, normal bowel tones heard  Skin: dry, no rashes  Musculoskeletal: no joint effusions, normal range of motion  Psychiatric: appropriate affect, normal speech  Neurologic: extraocular muscles intact, clear speech, moving all extremities with intact sensorium         Labs on Admission:  Basic Metabolic Panel: Recent Labs  Lab 03/14/24 1248  NA 138  K 4.3  CL 105  CO2 22  GLUCOSE 129*  BUN 37*  CREATININE 0.82  CALCIUM  9.4   Liver Function Tests: Recent Labs  Lab 03/14/24 1248  AST 22  ALT 25  ALKPHOS 43  BILITOT 0.5  PROT 6.6  ALBUMIN 3.4*   No results for input(s): "LIPASE", "AMYLASE" in the last 168 hours. No results for input(s): "AMMONIA" in the last 168 hours. CBC: Recent Labs  Lab 03/14/24 1248  WBC 19.7*  NEUTROABS 17.8*  HGB 13.5  HCT 44.1  MCV 86.5  PLT 230   Cardiac Enzymes: No results for input(s): "CKTOTAL", "CKMB", "CKMBINDEX", "TROPONINI" in the last 168 hours. BNP (last 3 results) Recent Labs    03/14/24 1248  BNP 60.8    ProBNP (last 3 results) Recent Labs    03/18/23 1402 03/06/24 0934  PROBNP 71.0 70.0    CBG: No results for input(s): "GLUCAP" in the last 168 hours.  Radiological Exams on Admission: DG Chest 2 View Result Date: 03/14/2024 CLINICAL DATA:  Cough and dyspnea EXAM: CHEST - 2 VIEW COMPARISON:  March 06, 2024 FINDINGS: Chronic interstitial lung disease. Without evidence of acute infiltrates or consolidations. Overall no significant change since prior examination No pleural effusions.  Heart and mediastinum normal IMPRESSION: Chronic interstitial lung disease. Electronically Signed   By: Fredrich Jefferson M.D.   On: 03/14/2024 14:50   Assessment/Plan Rhonda [name] is a 72 y.o. female with medical history significant for interstitial lung disease on chronic prednisone , pulmonary hypertension, IBS, steroid-induced DM, palpitations on Cardizem  being admitted to the hospital  with continued acute hypoxic respiratory failure.   Chronic cough and dyspnea on exertion-etiology is unclear at this moment, pulmonology assistance and evaluation is greatly appreciated.  No evidence of bacterial infection currently, heart failure, etc.  Failure to improve despite multiple antibiotic courses, as well as increased prednisone .  She may have an atypical/viral infection. -Inpatient admission -Follow-up respiratory viral panel, sputum culture, etc. -Continue bronchodilators, Singulair , PPI, Claritin  -Anticipate she may benefit from inpatient bronc -Repeat CT chest ordered by pulmonary -Mucinex  as needed -Continue with prednisone  30 mg p.o. daily for now  New left lower lobe nodular opacity-seen on CT chest/14  Palpitations-continue Cardizem , metoprolol   Steroid-induced diabetes-continue basal bolus insulin , update hemoglobin A1c.  Carb modified diet.  Hyperlipidemia-Zetia , statin  DVT prophylaxis: Lovenox      Code Status: Full Code  Consults called: Pulmonology  Admission status: The appropriate patient status for this patient is INPATIENT. Inpatient status is judged to be reasonable and necessary  in order to provide the required intensity of service to ensure the patient's safety. The patient's presenting symptoms, physical exam findings, and initial radiographic and laboratory data in the context of their chronic comorbidities is felt to place them at high risk for further clinical deterioration. Furthermore, it is not anticipated that the patient will be medically stable for discharge from the hospital within 2 midnights of admission.    I certify that at the point of admission it is my clinical judgment that the patient will require inpatient hospital care spanning beyond 2 midnights from the point of admission due to high intensity of service, high risk for further deterioration and high frequency of surveillance required  Time spent: 59 minutes  Rhonda [name] Rickey Charm  MD [facility] Pager [phone]  If 7PM-7AM, please contact night-coverage [email or web address] Password [facility]  03/14/2024, 3:36 PM

## 2024-03-14 NOTE — ED Triage Notes (Signed)
 SOB for 10-15 days, currently taking steroids. Pt has cystic fibrosis, wears 4L [place] at baseline. Pt states O2 was 88% on her 4L at home, pt is 99% on 4L on arrival, room air saturation is 93%. Pt does have labored, tachypneic breathing.

## 2024-03-14 NOTE — ED Provider Triage Note (Signed)
 Emergency Medicine Provider Triage Evaluation Note  Rhonda [name] , a 73 y.o. female  was evaluated in triage.  Pt complains of SOB since end of March. Has been on 2 rounds of doxycycline , cefpodoxime , and now levaquin  for the past 5 days without improvement. She has been on 40mg  prednisone  for the past 8 days. Had a CT 4/16 which showed pneumonia. Oxygen  dropped to 81% on 4L [place]. Was advised to come in and be admitted.  Review of Systems  Positive: SOB, cough Negative: Fevers   Physical Exam  There were no vitals taken for this visit. Gen:   Awake, no distress   Resp:  Normal effort  MSK:   Moves extremities without difficulty  Other:    Medical Decision Making  Medically screening exam initiated at 12:24 PM.  Appropriate orders placed.  Rhonda [name] was informed that the remainder of the evaluation will be completed by another provider, this initial triage assessment does not replace that evaluation, and the importance of remaining in the ED until their evaluation is complete.     Sonnie Dusky, PA-C 03/14/24 1240

## 2024-03-14 NOTE — Consult Note (Addendum)
 NAME:  Rhonda [name], MRN:  [id number], DOB:  1951/08/25, LOS: 0 ADMISSION DATE:  03/14/2024, CONSULTATION DATE:  03/14/24 REFERRING MD:  Aldean Amass - EM , CHIEF COMPLAINT:  SOB    History of Present Illness:  73 yo F PMH ILD r/t hypersensitivity pneumonitis on chronic pred but not pcp ppx, environmental allergies on immunotherapy, pHTN, IBS, DM, who presented to [facility] ED 03/14/24 at referral of Dr. Bertrum Brodie w [facility].   In review of outpt notes, it looks like she has had persistent cough/DOE since March 2025. She started Cellcept  around 02/03/24. Has been Rx doxy + prednisone  (3/26), later cefpodoxime  was added for broader coverage after CXR revealed L sided opacities. After completing these, sx returned and 4/7 PCP restarted her abx. Looks  like pt had increased her pred from 20mg  to 30mg  given her sx recurrence  and had held her Cellcept  2/2 acute illness. She had not been on cellcept  long enough to up-titrate at all.  She presented to pulm clinic 4/14 where steroids wer inc to 40mg  x7d then 30mg  x7 then 20mg  Ddimer ok, AFB and fungal cx were sent (still pending at time of ED presentation) & a CT chest was obtained which has diffuse GGOs + a new LLL 18x76mm nodule, for which pt was started on levaquin    Despite these efforts, pt has failed to improved and reached out to pulm who referred her to [name] ED 4/22 for further workup/care given persistent sx + some new hypoxia (telephone note citing SpOe 92 on RA desat to 81 with minimal exertion)   PCCM is consulted in this setting     Pertinent  Medical History  GERD CAD IBS Depression DM HLD pHTN Hypersensitivity pneumonitis ILD   Significant Hospital Events: Including procedures, antibiotic start and stop dates in addition to other pertinent events   4/22 ED at referral of outpt pulm.   Interim History / Subjective:  Consulted   Objective   Blood pressure (!) 144/64, pulse 86, temperature 98.2 F (36.8 C), temperature source Oral,  resp. rate (!) 26, height 4\' 11"  (1.499 m), weight 64 kg, SpO2 93%.       No intake or output data in the 24 hours ending 03/14/24 1442 Filed Weights   03/14/24 1238  Weight: 64 kg    Examination: General: Very pleasant chronically ill appearing older adult F NAD supine on stretcher  HENT: NCAT pink mm anicteric sclera  Lungs: Even and unlabored, symmetrical chest expansion  Cardiovascular: rrr Abdomen: soft round  Extremities: no acute joint deformity  Neuro: AAOx4  GU: defer   Resolved Hospital Problem list     Assessment & Plan:   Chronic hypoxic resp failure DOE ILD LLL nodular opacity (18 x43mm, new on CT chest 4/14)  -has had several abx courses and various steroid doses since this bout of sx started in March.  -persistence despite abx courses might suggest this is something other than bacterial PNA, possible viral process. Could be that this is dz progression vs flare. Sx not really suggestive of PE  P -defer abx for now  -RVP, covid+ flu - ddimer, BNP, PCT, sputum cx (AFB and fungal from 4/14 are still pending but cx from that visit was inadequate 2/2 contamination) -bronchodilators, singulair   -CT chest  -will continue pred 30mg  but will defer IV steroids for now -will add PPI   -wean Or for goal 88-90  -pending course may end up needing bronch   Best Practice (right click and "Reselect  all SmartList Selections" daily)   Diet/type: Regular consistency (see orders) DVT prophylaxis per primary  Pressure ulcer(s): N/A GI prophylaxis: PPI Lines: N/A Foley:  N/A Code Status:  prior listed is Full. Did not discuss at time of consultation.  Last date of multidisciplinary goals of care discussion [4/22]  Labs   CBC: Recent Labs  Lab 03/14/24 1248  WBC 19.7*  NEUTROABS 17.8*  HGB 13.5  HCT 44.1  MCV 86.5  PLT 230    Basic Metabolic Panel: Recent Labs  Lab 03/14/24 1248  NA 138  K 4.3  CL 105  CO2 22  GLUCOSE 129*  BUN 37*  CREATININE 0.82   CALCIUM  9.4   GFR: Estimated Creatinine Clearance: 48.2 mL/min (by C-G formula based on SCr of 0.82 mg/dL). Recent Labs  Lab 03/14/24 1248  WBC 19.7*    Liver Function Tests: Recent Labs  Lab 03/14/24 1248  AST 22  ALT 25  ALKPHOS 43  BILITOT 0.5  PROT 6.6  ALBUMIN 3.4*   No results for input(s): "LIPASE", "AMYLASE" in the last 168 hours. No results for input(s): "AMMONIA" in the last 168 hours.  ABG    Component Value Date/Time   PHART 7.410 09/29/2012 0500   PCO2ART 41.1 09/29/2012 0500   PO2ART 58.7 (L) 09/29/2012 0500   HCO3 25.5 (H) 09/29/2012 0500   TCO2 26.8 09/29/2012 0500   ACIDBASEDEF 0.9 09/26/2012 1143   O2SAT 91.3 09/29/2012 0500     Coagulation Profile: No results for input(s): "INR", "PROTIME" in the last 168 hours.  Cardiac Enzymes: No results for input(s): "CKTOTAL", "CKMB", "CKMBINDEX", "TROPONINI" in the last 168 hours.  HbA1C: Hgb A1c MFr Bld  Date/Time Value Ref Range Status  09/28/2019 12:26 PM 6.2 (H) 4.8 - 5.6 % Final    Comment:    (NOTE) Pre diabetes:          5.7%-6.4% Diabetes:              >6.4% Glycemic control for   <7.0% adults with diabetes   12/08/2018 09:17 AM 6.0 (H) 4.8 - 5.6 % Final    Comment:             Prediabetes: 5.7 - 6.4          Diabetes: >6.4          Glycemic control for adults with diabetes: <7.0     CBG: No results for input(s): "GLUCAP" in the last 168 hours.  Review of Systems:   Review of Systems  HENT:  Positive for congestion.   Respiratory:  Positive for cough, sputum production and shortness of breath. Negative for hemoptysis.   Cardiovascular:  Positive for leg swelling. Negative for chest pain, palpitations and orthopnea.  Endo/Heme/Allergies:  Positive for environmental allergies.     Past Medical History:  She,  has a past medical history of Allergy, Arthritis, Asthma, Cataract, Coronary artery calcification seen on CAT scan (08/19/2017), DDD (degenerative disc disease), thoracic,  Depression, DOE (dyspnea on exertion), Fatty liver, GERD (gastroesophageal reflux disease), H/O steroid therapy, Heart palpitations (02/28/2015), Helicobacter pylori ab+, Hemorrhoids, Hiatal hernia, High cholesterol, History of chronic bronchitis, History of migraine, History of MRSA infection, Hyperlipidemia, mixed (08/19/2017), Hyperplastic colon polyp (2007), IBS (irritable bowel syndrome), Inguinal hernia, Insulin  resistance, Interstitial lung disease (HCC), MVP (mitral valve prolapse), Pneumonia, Pneumonitis, hypersensitivity (HCC), PONV (postoperative nausea and vomiting), Pre-diabetes, Pulmonary fibrosis (HCC), PVC (premature ventricular contraction) (08/19/2017), Rapid heart rate, Squamous cell carcinoma of skin, Tinnitus, right ear, and Vocal cord  ulcer.   Surgical History:   Past Surgical History:  Procedure Laterality Date   26 HOUR PH STUDY N/A 02/21/2018   Procedure: 24 HOUR PH STUDY;  Surgeon: Sergio Dandy, MD;  Location: [facility] ENDOSCOPY;  Service: Endoscopy;  Laterality: N/A;   BREAST BIOPSY Right 2009   BIOPSY, pt denies   BREAST BIOPSY Left 2003   Benign    CATARACT EXTRACTION Left    CESAREAN SECTION     COLONOSCOPY     ESOPHAGEAL MANOMETRY N/A 02/21/2018   Procedure: ESOPHAGEAL MANOMETRY (EM);  Surgeon: Sergio Dandy, MD;  Location: [facility] END[facility];  Service: Endoscopy;  Laterality: N/A;   FOOT FRACTURE SURGERY  2006 or 2007   right   HYMENECTOMY     LUNG BIOPSY  09/28/2012   Procedure: LUNG BIOPSY;  Surgeon: Zelphia Higashi, MD;  Location: [facility] OR;  Service: Thoracic;  Laterality: N/A;  lung biopsies tims three   SQUAMOUS CELL CARCINOMA EXCISION Left    left arm   TOTAL HIP ARTHROPLASTY Right 09/10/2015   Procedure: RIGHT TOTAL HIP ARTHROPLASTY ANTERIOR APPROACH;  Surgeon: Claiborne Crew, MD;  Location: [facility] ORS;  Service: Orthopedics;  Laterality: Right;   TOTAL HIP ARTHROPLASTY Left 10/03/2019   Procedure: TOTAL HIP ARTHROPLASTY ANTERIOR APPROACH;  Surgeon: Claiborne Crew, MD;  Location: [facility] ORS;  Service: Orthopedics;  Laterality: Left;  70 mins   TUBAL LIGATION     UPPER GI ENDOSCOPY     VIDEO ASSISTED THORACOSCOPY  09/28/2012   Procedure: VIDEO ASSISTED THORACOSCOPY;  Surgeon: Zelphia Higashi, MD;  Location: [facility] OR;  Service: Thoracic;  Laterality: Right;   VIDEO BRONCHOSCOPY  11/19/2011   Procedure: VIDEO BRONCHOSCOPY WITH FLUORO;  Surgeon: Maire Scot, MD;  Location: [facility] ENDOSCOPY;  Service: Endoscopy;;     Social History:   reports that she has never smoked. She has never used smokeless tobacco. She reports current alcohol use of about 2.0 standard drinks of alcohol per week. She reports that she does not use drugs.   Family History:  Her family history includes Allergic rhinitis in her daughter; Asthma in her maternal grandmother and mother; Bone cancer in her paternal grandfather; Breast cancer in her cousin, cousin, cousin, paternal aunt, and paternal aunt; Dementia (age of onset: 42) in her mother; Diabetes in her father; Emphysema in her maternal grandmother; Heart disease in her sister; Hypertension in her sister; Kidney disease in her sister; Lung disease in her maternal grandfather; Lymphoma in her father; Osteoarthritis in her mother; Ovarian cancer in her maternal aunt. There is no history of Colon cancer, Angioedema, Eczema, Immunodeficiency, or Urticaria.   Allergies Allergies  Allergen Reactions   Atorvastatin Other (See Comments)    Leg pain   Betadine [Povidone Iodine] Other (See Comments)    Blisters    Codeine Nausea And Vomiting   Garlic Diarrhea   Hydrocodone  Nausea And Vomiting   Macrobid [Nitrofurantoin] Other (See Comments)   Ofev  [Nintedanib] Other (See Comments)    Abdominal pain   Onion Diarrhea   Rosuvastatin Other (See Comments)    Leg pain   Shellfish Allergy Nausea And Vomiting   Statins Other (See Comments)    "Leg pain", Other reaction(s): Other (See Comments), Other (See Comments), "Leg pain", Leg  pain, Leg pain   Sulfa  Antibiotics    Sulfonamide Derivatives Other (See Comments)    headaches     Home Medications  Prior to Admission medications   Medication Sig Start Date End Date Taking? Authorizing Provider  acetaminophen  (TYLENOL ) 500 MG tablet Tylenol  Extra Strength 08/24/19   [provider]  albuterol  (PROAIR  HFA) 108 (90 Base) MCG/ACT inhaler Inhale 2 puffs into the lungs every 4 (four) hours as needed for wheezing. Or coughing spells.  You may use 2 Puffs 5-10 minutes before exercise. 04/25/18   Ambs, Jeanmarie Millet, FNP  Alpha-D-Galactosidase (BEANO PO) Take 1 tablet by mouth as needed (if eating onion or garlic).     [provider]  AMBULATORY NON FORMULARY MEDICATION Allergy injections Once a week    [provider]  Ascorbic Acid (VITAMIN C) 1000 MG tablet Take 1,000 mg by mouth daily.    [provider]  ASHWAGANDHA PO Take 2 capsules by mouth daily.    [provider]  BREO ELLIPTA  100-25 MCG/ACT AEPB Inhale 1 puff into the lungs daily. 03/07/24   Raejean Bullock, NP  budesonide  (PULMICORT ) 0.5 MG/2ML nebulizer solution Take 2 mLs (0.5 mg total) by nebulization in the morning and at bedtime. 02/16/24   Cobb, Mariah Shines, NP  Calcium  Carb-Cholecalciferol (CALCIUM  500 + D PO) Take 2 tablets by mouth daily at 6 (six) AM.    [provider]  chlorpheniramine (CHLOR-TRIMETON) 4 MG tablet Take 2 mg by mouth 2 (two) times daily. Takes half tablet twice daily    [provider]  cholecalciferol (VITAMIN D3) 25 MCG (1000 UNIT) tablet Take 1,000 Units by mouth every other day.    [provider]  ciclopirox (PENLAC) 8 % solution See admin instructions. 02/09/23   [provider]  Continuous Glucose Sensor (FREESTYLE LIBRE 3 SENSOR) MISC check blood sugar fasting and 2 hours after largest meal 01/28/24   [provider]  dicyclomine  (BENTYL ) 10 MG capsule Take 1 capsule by mouth daily as needed. 08/16/18    [provider]  diltiazem  (CARDIZEM  CD) 120 MG 24 hr capsule Take 1 capsule (120 mg total) by mouth daily. 09/28/23   Eilleen Grates, MD  diphenhydrAMINE -PE-APAP (DELSYM COUGH/COLD NIGHT TIME) 12.5-5-325 MG/10ML LIQD Take by mouth as needed.    [provider]  EPINEPHrine  0.3 mg/0.3 mL IJ SOAJ injection Inject into the muscle.    [provider]  ezetimibe  (ZETIA ) 10 MG tablet Take 1 tablet (10 mg total) by mouth daily. 02/10/24   Eilleen Grates, MD  fluticasone  (FLONASE ) 50 MCG/ACT nasal spray Spray 2 sprays into each nostril every day. 03/06/24   Maire Scot, MD  HUMALOG  KWIKPEN 100 UNIT/ML KwikPen Taken 9 unit. 01/27/24   [provider]  ipratropium-albuterol  (DUONEB) 0.5-2.5 (3) MG/3ML SOLN USE ONE VIAL IN NEBULIZER EVERY 6 HOURS AS NEEDED 02/14/24   [provider]  lactase (LACTAID) 3000 units tablet Take by mouth.    [provider]  LANTUS  SOLOSTAR 100 UNIT/ML Solostar Pen Inject 14 Units into the skin daily. 01/03/24   [provider]  levocetirizine (XYZAL ) 5 MG tablet Take 5 mg by mouth at bedtime. 09/26/20   [provider]  levofloxacin  (LEVAQUIN ) 500 MG tablet Take 1 tablet (500 mg total) by mouth daily for 10 days. 03/09/24 03/19/24  Cobb, Mariah Shines, NP  metFORMIN  (GLUCOPHAGE -XR) 500 MG 24 hr tablet Take 500 mg by mouth 2 (two) times daily with a meal. 07/08/23   [provider]  metoprolol  succinate (TOPROL -XL) 25 MG 24 hr tablet Take 1 tablet (25 mg total) by mouth daily. 06/10/23   Eilleen Grates, MD  montelukast  (SINGULAIR ) 10 MG tablet TAKE 1 TABLET BY MOUTH EVERYDAY AT BEDTIME 01/29/20  Maire Scot, MD  Multiple Vitamin (MULTIVITAMIN) tablet Take 1 tablet by mouth daily.    [provider]  mycophenolate  (CELLCEPT ) 500 MG tablet Take 1 tab twice daily x 2 weeks. REPEAT LABS. If stable, increase to 2 tabs twice daily x 2 weeks. REPEAT LABS. If stable, increase to 3 tabs twice  daily Patient not taking: Reported on 03/06/2024 02/07/24   Maire Scot, MD  nitroGLYCERIN  (NITROSTAT ) 0.4 MG SL tablet Place 1 tablet (0.4 mg total) under the tongue every 5 (five) minutes as needed for chest pain. 12/06/18 10/06/23  Kroeger, Irvine Mantis., PA-C  nystatin-triamcinolone  (MYCOLOG II) cream nystatin-triamcinolone  100,000 unit/g-0.1 % topical cream    [provider]  omeprazole  (PRILOSEC  OTC) 20 MG tablet Take 10 mg by mouth daily.    [provider]  OXYGEN  Inhale into the lungs. Up to 6 liters as needed during the day and nite time use on/off    [provider]  pravastatin  (PRAVACHOL ) 40 MG tablet Take 1 tablet (40 mg total) by mouth every evening. 06/12/20   Eilleen Grates, MD  predniSONE  (DELTASONE ) 10 MG tablet Take 1 tablet (10 mg total) by mouth daily with breakfast. Patient taking differently: Take 20 mg by mouth daily with breakfast. 06/08/23   Maire Scot, MD  predniSONE  (DELTASONE ) 10 MG tablet Take 4 tablets for 3 days then 3 tablets for 3 days then return to 20 mg daily 02/16/24   Cobb, Mariah Shines, NP  Sodium Chloride , Inhalant, 7 % NEBU Use per nebulizer once a day to help clear sputum 07/08/21   Maire Scot, MD  sulfamethoxazole -trimethoprim  (BACTRIM  DS) 800-160 MG tablet Take 1 tablet by mouth 3 (three) times a week. (START after completing two weeks of CELLCEPT  1000mg  twice daily ) Patient not taking: Reported on 03/06/2024 02/07/24   Maire Scot, MD     Critical care time: na       Eston Hence MSN, AGACNP-BC [place] [facility] Amion for pager  03/14/2024, 2:42 PM

## 2024-03-14 NOTE — ED Provider Notes (Signed)
 [place] EMERGENCY DEPARTMENT AT [facility] Provider Note   CSN: [id number] Arrival date & time: 03/14/24  1217     History  Chief Complaint  Patient presents with   Shortness of Breath    Rhonda [name] is a 73 y.o. female.  HPI 73 year old female with a history of pulmonary fibrosis presents with increasing shortness of breath.  She has been dealing with pneumonia and a flare for the past month or so.  Originally saw her doctor and was treated with doxycycline  and increased steroids which is typically how she treats her pulmonary fibrosis flares.  Did not get much better and saw pulmonology and was given a different antibiotic and more steroids.  Had to come back a week later and had a CT of the chest and was changed to Levaquin .  Cough is changed from yellow sputum to now white sputum.  No fevers.  Has had some chest pain, the chest pain on the left side seems to be gone but now there is a little bit on the right.  No leg swelling but she has gained about 4 or 5 pounds recently.  She typically wears oxygen  as needed but has significant hypoxia whenever she tries to do any minimal activity and has been pretty much wearing oxygen  all the time now.  Called her pulmonologist who advised her to come to the ER and be admitted and get a pulmonary consultation.  Home Medications Prior to Admission medications   Medication Sig Start Date End Date Taking? Authorizing Provider  acetaminophen  (TYLENOL ) 500 MG tablet Take 500 mg by mouth every 6 (six) hours as needed for mild pain (pain score 1-3) or headache. 08/24/19  Yes [provider]  albuterol  (PROAIR  HFA) 108 (90 Base) MCG/ACT inhaler Inhale 2 puffs into the lungs every 4 (four) hours as needed for wheezing. Or coughing spells.  You may use 2 Puffs 5-10 minutes before exercise. Patient taking differently: Inhale 2 puffs into the lungs every 4 (four) hours as needed for wheezing or shortness of breath (or coughing  spells and can also be used 5-10 minutes before exercise). 04/25/18  Yes Ambs, Jeanmarie Millet, FNP  Alpha-D-Galactosidase (BEANO PO) Take 1 tablet by mouth as needed (as directed, if eating onion or garlic).   Yes [provider]  AMBULATORY NON FORMULARY MEDICATION See admin instructions. Allergy injections on Mondays and hold when sick   Yes [provider]  ascorbic acid (VITAMIN C) 500 MG tablet Take 500 mg by mouth daily.   Yes [provider]  ASHWAGANDHA PO Take 1 capsule by mouth every other day.   Yes [provider]  BREO ELLIPTA  100-25 MCG/ACT AEPB Inhale 1 puff into the lungs daily. 03/07/24  Yes Raejean Bullock, NP  budesonide  (PULMICORT ) 0.5 MG/2ML nebulizer solution Take 2 mLs (0.5 mg total) by nebulization in the morning and at bedtime. 02/16/24  Yes Cobb, Mariah Shines, NP  Calcium  Carb-Cholecalciferol (CALCIUM  500 + D PO) Take 2 tablets by mouth See admin instructions. Chew 2 gummies by mouth once a day   Yes [provider]  chlorpheniramine (CHLOR-TRIMETON) 4 MG tablet Take 2 mg by mouth 2 (two) times daily as needed for allergies or rhinitis.   Yes [provider]  cholecalciferol (VITAMIN D3) 25 MCG (1000 UNIT) tablet Take 1,000 Units by mouth every other day.   Yes [provider]  Continuous Glucose Sensor (DEXCOM G7 SENSOR) MISC Inject 1 Device into the skin See admin  instructions. Place one new sensor into the skin every 10 days   Yes [provider]  dicyclomine  (BENTYL ) 10 MG capsule Take 10 mg by mouth daily as needed for spasms. 08/16/18  Yes [provider]  diltiazem  (CARDIZEM  CD) 120 MG 24 hr capsule Take 1 capsule (120 mg total) by mouth daily. 09/28/23  Yes Eilleen Grates, MD  diphenhydrAMINE -PE-APAP (DELSYM COUGH/COLD NIGHT TIME) 12.5-5-325 MG/10ML LIQD Take 10 mLs by mouth every 12 (twelve) hours as needed (for coughing).   Yes [provider]  EPINEPHrine  0.3 mg/0.3 mL IJ SOAJ injection  Inject 0.3 mg into the muscle as needed for anaphylaxis.   Yes [provider]  ezetimibe  (ZETIA ) 10 MG tablet Take 1 tablet (10 mg total) by mouth daily. Patient taking differently: Take 10 mg by mouth at bedtime. 02/10/24  Yes Eilleen Grates, MD  fluticasone  (FLONASE ) 50 MCG/ACT nasal spray Spray 2 sprays into each nostril every day. Patient taking differently: Place 2 sprays into both nostrils in the morning. 03/06/24  Yes Maire Scot, MD  HUMALOG  KWIKPEN 100 UNIT/ML KwikPen Inject 8 Units into the skin 3 (three) times daily before meals. Taken 9 unit. 01/27/24  Yes [provider]  Hypromellose (NATURES TEARS OP) Place 1 drop into both eyes in the morning and at bedtime.   Yes [provider]  ipratropium-albuterol  (DUONEB) 0.5-2.5 (3) MG/3ML SOLN Take 3 mLs by nebulization every 6 (six) hours as needed (for shortness of breath or wheezing or when sick). 02/14/24  Yes [provider]  LANTUS  SOLOSTAR 100 UNIT/ML Solostar Pen Inject 16 Units into the skin daily before breakfast. 01/03/24  Yes [provider]  levocetirizine (XYZAL ) 5 MG tablet Take 5 mg by mouth at bedtime. 09/26/20  Yes [provider]  levofloxacin  (LEVAQUIN ) 500 MG tablet Take 1 tablet (500 mg total) by mouth daily for 10 days. 03/09/24 03/19/24 Yes Cobb, Mariah Shines, NP  metFORMIN  (GLUCOPHAGE -XR) 500 MG 24 hr tablet Take 500 mg by mouth daily with breakfast. 07/08/23  Yes [provider]  metoprolol  succinate (TOPROL -XL) 25 MG 24 hr tablet Take 1 tablet (25 mg total) by mouth daily. 06/10/23  Yes Eilleen Grates, MD  montelukast  (SINGULAIR ) 10 MG tablet TAKE 1 TABLET BY MOUTH EVERYDAY AT BEDTIME Patient taking differently: Take 10 mg by mouth at bedtime. 01/29/20  Yes Maire Scot, MD  Multiple Vitamin (MULTIVITAMIN) tablet Take 1 tablet by mouth daily with breakfast.   Yes [provider]  nitroGLYCERIN  (NITROSTAT ) 0.4 MG SL tablet Place 1 tablet (0.4 mg  total) under the tongue every 5 (five) minutes as needed for chest pain. 12/06/18 03/14/24 Yes Kroeger, Krista M., PA-C  omeprazole  (PRILOSEC  OTC) 20 MG tablet Take 10 mg by mouth daily before breakfast.   Yes [provider]  OXYGEN  Inhale 4-5 L/min into the lungs See admin instructions. Inhale 4-5 L/min at bedtime and as needed for shortness of breath during the day   Yes [provider]  pravastatin  (PRAVACHOL ) 40 MG tablet Take 1 tablet (40 mg total) by mouth every evening. Patient taking differently: Take 40 mg by mouth at bedtime. 06/12/20  Yes Eilleen Grates, MD  predniSONE  (DELTASONE ) 10 MG tablet Take 4 tablets for 3 days then 3 tablets for 3 days then return to 20 mg daily Patient taking differently: Take 20-30 mg by mouth See admin instructions. Beginning on 03/14/2024, take 30 mg by mouth in the morning with breakfast for 7 days, then decrease to 20 mg a day  thereafter 02/16/24  Yes Cobb, Mariah Shines, NP  sodium chloride  (OCEAN) 0.65 % SOLN nasal spray Place 1 spray into both nostrils as needed for congestion.   Yes [provider]  mycophenolate  (CELLCEPT ) 500 MG tablet Take 1 tab twice daily x 2 weeks. REPEAT LABS. If stable, increase to 2 tabs twice daily x 2 weeks. REPEAT LABS. If stable, increase to 3 tabs twice daily Patient not taking: Reported on 03/14/2024 02/07/24   Maire Scot, MD  predniSONE  (DELTASONE ) 10 MG tablet Take 1 tablet (10 mg total) by mouth daily with breakfast. Patient not taking: Reported on 03/14/2024 06/08/23   Maire Scot, MD  Sodium Chloride , Inhalant, 7 % NEBU Use per nebulizer once a day to help clear sputum Patient not taking: Reported on 03/14/2024 07/08/21   Maire Scot, MD  sulfamethoxazole -trimethoprim  (BACTRIM  DS) 800-160 MG tablet Take 1 tablet by mouth 3 (three) times a week. (START after completing two weeks of CELLCEPT  1000mg  twice daily ) Patient not taking: Reported on 03/14/2024 02/07/24   Maire Scot, MD       Allergies    Ozempic (0.25 or 0.5 mg-dose) [semaglutide(0.25 or 0.5mg -dos)], Tape, Atorvastatin, Betadine [povidone iodine], Codeine, Garlic, Hydrocodone , Macrobid [nitrofurantoin], Ofev  [nintedanib], Onion, Rosuvastatin, Shellfish allergy, Statins, and Sulfa  antibiotics    Review of Systems   Review of Systems  Constitutional:  Positive for unexpected weight change.  Respiratory:  Positive for cough and shortness of breath.   Cardiovascular:  Positive for chest pain. Negative for leg swelling.    Physical Exam Updated Vital Signs BP (!) 143/74   Pulse 72   Temp 97.9 F (36.6 C) (Oral)   Resp (!) 22   Ht 4\' 11"  (1.499 m)   Wt 64 kg   SpO2 98%   BMI 28.48 kg/m  Physical Exam Vitals and nursing note reviewed.  Constitutional:      General: She is not in acute distress.    Appearance: She is well-developed. She is not ill-appearing or diaphoretic.  HENT:     Head: Normocephalic and atraumatic.  Cardiovascular:     Rate and Rhythm: Normal rate and regular rhythm.     Heart sounds: Normal heart sounds.  Pulmonary:     Effort: Pulmonary effort is normal.     Comments: Coarse breath sounds bilaterally but no wheezing Abdominal:     Palpations: Abdomen is soft.     Tenderness: There is no abdominal tenderness.  Musculoskeletal:     Right lower leg: No edema.     Left lower leg: No edema.  Skin:    General: Skin is warm and dry.  Neurological:     Mental Status: She is alert.     ED Results / Procedures / Treatments   Labs (all labs ordered are listed, but only abnormal results are displayed) Labs Reviewed  COMPREHENSIVE METABOLIC PANEL WITH GFR - Abnormal; Notable for the following components:      Result Value   Glucose, Bld 129 (*)    BUN 37 (*)    Albumin 3.4 (*)    All other components within normal limits  CBC WITH DIFFERENTIAL/PLATELET - Abnormal; Notable for the following components:   WBC 19.7 (*)    RDW 16.3 (*)    Neutro Abs 17.8 (*)    Abs  Immature Granulocytes 0.20 (*)    All other components within normal limits  RESP PANEL BY RT-PCR (RSV, FLU A&B, COVID)  RVPGX2  RESPIRATORY PANEL BY PCR  EXPECTORATED SPUTUM ASSESSMENT  W GRAM STAIN, RFLX TO RESP C  PROCALCITONIN  BRAIN NATRIURETIC PEPTIDE  D-DIMER, QUANTITATIVE (NOT AT [facility])    EKG EKG Interpretation Date/Time:  Tuesday March 14 2024 15:15:35 EDT Ventricular Rate:  73 PR Interval:  118 QRS Duration:  77 QT Interval:  376 QTC Calculation: 415 R Axis:   33  Text Interpretation: Sinus rhythm Borderline short PR interval Borderline T abnormalities, diffuse leads overall similar to Mar 2025 Confirmed by Jerilynn Montenegro [phone]) on 03/14/2024 3:25:41 PM  Radiology DG Chest 2 View Result Date: 03/14/2024 CLINICAL DATA:  Cough and dyspnea EXAM: CHEST - 2 VIEW COMPARISON:  March 06, 2024 FINDINGS: Chronic interstitial lung disease. Without evidence of acute infiltrates or consolidations. Overall no significant change since prior examination No pleural effusions.  Heart and mediastinum normal IMPRESSION: Chronic interstitial lung disease. Electronically Signed   By: Fredrich Jefferson M.D.   On: 03/14/2024 14:50    Procedures Procedures    Medications Ordered in ED Medications  budesonide  (PULMICORT ) nebulizer solution 0.5 mg (0.5 mg Nebulization Not Given 03/14/24 1509)  ipratropium-albuterol  (DUONEB) 0.5-2.5 (3) MG/3ML nebulizer solution 3 mL (has no administration in time range)  fluticasone  furoate-vilanterol (BREO ELLIPTA ) 100-25 MCG/ACT 1 puff (has no administration in time range)  montelukast  (SINGULAIR ) tablet 10 mg (has no administration in time range)  pantoprazole  (PROTONIX ) EC tablet 40 mg (40 mg Oral Not Given 03/14/24 1510)  guaiFENesin  (MUCINEX ) 12 hr tablet 600 mg (has no administration in time range)  predniSONE  (DELTASONE ) tablet 30 mg (has no administration in time range)  enoxaparin  (LOVENOX ) injection 40 mg (has no administration in time range)  diltiazem   (CARDIZEM  CD) 24 hr capsule 120 mg (has no administration in time range)  ezetimibe  (ZETIA ) tablet 10 mg (has no administration in time range)  metoprolol  succinate (TOPROL -XL) 24 hr tablet 25 mg (has no administration in time range)  pravastatin  (PRAVACHOL ) tablet 40 mg (has no administration in time range)  insulin  glargine-yfgn (SEMGLEE ) injection 16 Units (has no administration in time range)  loratadine  (CLARITIN ) tablet 10 mg (has no administration in time range)  insulin  aspart (novoLOG ) injection 0-15 Units (has no administration in time range)  insulin  aspart (novoLOG ) injection 0-5 Units (has no administration in time range)  acetaminophen  (TYLENOL ) tablet 650 mg (has no administration in time range)    Or  acetaminophen  (TYLENOL ) suppository 650 mg (has no administration in time range)  traZODone  (DESYREL ) tablet 25 mg (has no administration in time range)  ondansetron  (ZOFRAN ) tablet 4 mg (has no administration in time range)    Or  ondansetron  (ZOFRAN ) injection 4 mg (has no administration in time range)  insulin  aspart (novoLOG ) injection 8 Units (has no administration in time range)  ipratropium-albuterol  (DUONEB) 0.5-2.5 (3) MG/3ML nebulizer solution 3 mL (3 mLs Nebulization Given 03/14/24 1243)    ED Course/ Medical Decision Making/ A&P                                 Medical Decision Making Amount and/or Complexity of Data Reviewed Labs:     Details: Elevated WBC, similar to prior levels Radiology: ordered and independent interpretation performed.    Details: No pneumonia  Risk Decision regarding hospitalization.   Patient presents with worsening shortness of breath and hypoxia with limited movements.  Pulmonology is concerned about worsening interstitial lung disease.  They have consulted on the patient and the patient will be admitted to the hospitalist service.  Otherwise patient is currently stable in the room with minimal increased work of breathing.  Discussed  with Dr. Jannette Mend.        Final Clinical Impression(s) / ED Diagnoses Final diagnoses:  Shortness of breath    Rx / DC Orders ED Discharge Orders     None         Jerilynn Montenegro, MD 03/14/24 [phone]

## 2024-03-14 NOTE — Telephone Encounter (Signed)
   Rhonda [name]= contacted me directly yesterday saying that she was not feeling well despite holding the CellCept  and doing multiple antibiotics and prednisone  between primary care and nurse practitioner Katie call.  So yesterday I met face-to-face with Rhonda [name] to discuss a plan.  Rhonda [name] was planning to bring the patient in this week instead of next week.  Also assess her oxygenation status.  Consider bronchoscopy for any opportunistic infections.  However today both Rhonda [name] and patient got back to me saying that she was feeling worse.  Patient described to me on the phone that while sitting her pulse ox is 92% but even slight movements makes a desaturate to 81% on room air.  In addition when she was talking even though she sounded normal she was getting labored with breathing.  She said coughing was making her desaturate.  Plan - Report to [facility] emergency department - Requires admission to the hospitalist service - Requires pulmonary consultation - If stable will need bronchoscopy likely but inpatient pulmonary consultation can reassess - Likely needs IV steroids and other differential diagnosis (recent D-dimer was normal].   D/w Dr Adan Holms of ER   SIGNATURE    Dr. Maire Scot, M.D., F.C.C.P,  Pulmonary and Critical Care Medicine Staff Physician, [facility] Center Director - Interstitial Lung Disease  Program  Pulmonary Fibrosis [facility] at [facility] [place], [place], [address]   Pager: [phone], If no answer  -> Check AMION or Try [phone] Telephone (clinical office): [phone] Telephone (research): [phone]  11:39 AM 03/14/2024

## 2024-03-15 ENCOUNTER — Ambulatory Visit: Admitting: Nurse Practitioner

## 2024-03-15 DIAGNOSIS — J849 Interstitial pulmonary disease, unspecified: Secondary | ICD-10-CM | POA: Diagnosis not present

## 2024-03-15 LAB — BASIC METABOLIC PANEL WITH GFR
Anion gap: 9 (ref 5–15)
BUN: 27 mg/dL — ABNORMAL HIGH (ref 8–23)
CO2: 31 mmol/L (ref 22–32)
Calcium: 8.9 mg/dL (ref 8.9–10.3)
Chloride: 100 mmol/L (ref 98–111)
Creatinine, Ser: 0.91 mg/dL (ref 0.44–1.00)
GFR, Estimated: >60 mL/min (ref >60)
Glucose, Bld: 114 mg/dL — ABNORMAL HIGH (ref 70–99)
Potassium: 4.3 mmol/L (ref 3.5–5.1)
Sodium: 140 mmol/L (ref 135–145)

## 2024-03-15 LAB — GLUCOSE, CAPILLARY
Glucose-Capillary: 98 mg/dL (ref 70–99)
Glucose-Capillary: 104 mg/dL — ABNORMAL HIGH (ref 70–99)
Glucose-Capillary: 106 mg/dL — ABNORMAL HIGH (ref 70–99)
Glucose-Capillary: 150 mg/dL — ABNORMAL HIGH (ref 70–99)

## 2024-03-15 LAB — CBC
HCT: 43 % (ref 36.0–46.0)
Hemoglobin: 12.9 g/dL (ref 12.0–15.0)
MCH: 26.6 pg (ref 26.0–34.0)
MCHC: 30 g/dL (ref 30.0–36.0)
MCV: 88.7 fL (ref 80.0–100.0)
Platelets: 183 10*3/uL (ref 150–400)
RBC: 4.85 MIL/uL (ref 3.87–5.11)
RDW: 16.2 % — ABNORMAL HIGH (ref 11.5–15.5)
WBC: 12.3 10*3/uL — ABNORMAL HIGH (ref 4.0–10.5)
nRBC: 0 % (ref 0.0–0.2)

## 2024-03-15 LAB — HEMOGLOBIN A1C
Hgb A1c MFr Bld: 7.2 % — ABNORMAL HIGH (ref 4.8–5.6)
Mean Plasma Glucose: 159.94 mg/dL

## 2024-03-15 MED ORDER — METOPROLOL TARTRATE 5 MG/5ML IV SOLN: 5.0000 mg | INTRAVENOUS | Status: DC | PRN

## 2024-03-15 MED ORDER — DICYCLOMINE HCL 10 MG PO CAPS
10.0000 mg | ORAL_CAPSULE | Freq: Every day | ORAL | Status: DC | PRN
  Administered 2024-03-15: 10 mg via ORAL
  Filled 2024-03-15: qty 1

## 2024-03-15 MED ORDER — HYDRALAZINE HCL 20 MG/ML IJ SOLN: 10.0000 mg | INTRAMUSCULAR | Status: DC | PRN

## 2024-03-15 MED ORDER — TRAZODONE HCL 50 MG PO TABS: 50.0000 mg | ORAL_TABLET | Freq: Every evening | ORAL | Status: DC | PRN

## 2024-03-15 MED ORDER — SENNOSIDES-DOCUSATE SODIUM 8.6-50 MG PO TABS: 1.0000 | ORAL_TABLET | Freq: Every evening | ORAL | Status: DC | PRN

## 2024-03-15 NOTE — Progress Notes (Addendum)
   03/15/24 1134  TOC Brief Assessment  Insurance and Status Reviewed  Patient has primary care physician Yes  Home environment has been reviewed single family home  Prior level of function: independent  Prior/Current Home Services No current home services  Social Drivers of Health Review SDOH reviewed no interventions necessary  Readmission risk has been reviewed Yes  Transition of care needs no transition of care needs at this time    CSW met with pt to discuss home oxygen  and discharge needs. Pt reports she is on 4L of oxygen  PRN at home, during activity, and sleeping. Pt receives O2 through [employer]. Pt denies any current HH services. Pt denies any use of assistive devices. Pt lives at home with husband, who will pick pt up at discharge. Pt has travel O2 concentrator with her in room for discharge. No TOC needs at this time.  [place], [place] 03/15/2024 11:38 AM

## 2024-03-15 NOTE — Hospital Course (Addendum)
 Brief Narrative:  73 year old with history of interstitial lung disease on chronic prednisone , pulmonary HTN, IBS, steroid-induced DM, palpitations on Cardizem  comes to the hospital acute hypoxic respiratory distress, persistent,, DOE.  She was recently started on CellCept  in efforts to reduce steroids by pulmonary team.   Assessment & Plan:  Principal Problem:   Interstitial lung disease (HCC)    Dyspnea on exertion Chronic cough Presume ILD flare Rhinovirus infection -Respiratory panel is positive for rhinovirus.  Possibly inducing this ILD flare -Patient follows outpatient with pulmonary team but due to worsening status for now has been admitted to the hospital.  Plan is to give Solu-Medrol  IV for total of 3 days followed by prednisone  taper. - Multiple cultures have been ordered Aggressive bronchodilator treatment. -CT chest has been reviewed showing coarse opacities   Palpitations -continue Cardizem , metoprolol .  IV as needed   Steroid-induced diabetes - A1c - Sliding scale and Accu-Cheks   Hyperlipidemia -Zetia , statin   DVT prophylaxis:Lovenox     Code Status: Full Code Family Communication:   Continue hospital stay for IV steroids    Subjective: Feeling better no complaints today besides dyspnea on exertion   Examination:  General exam: Appears calm and comfortable  Respiratory system: Some bilateral rhonchi Cardiovascular system: S1 & S2 heard, RRR. No JVD, murmurs, rubs, gallops or clicks. No pedal edema. Gastrointestinal system: Abdomen is nondistended, soft and nontender. No organomegaly or masses felt. Normal bowel sounds heard. Central nervous system: Alert and oriented. No focal neurological deficits. Extremities: Symmetric 5 x 5 power. Skin: No rashes, lesions or ulcers Psychiatry: Judgement and insight appear normal. Mood & affect appropriate.

## 2024-03-15 NOTE — Plan of Care (Signed)
  Problem: Metabolic: Goal: Ability to maintain appropriate glucose levels will improve Outcome: Progressing   Problem: Clinical Measurements: Goal: Respiratory complications will improve Outcome: Progressing   Problem: Activity: Goal: Risk for activity intolerance will decrease Outcome: Progressing

## 2024-03-15 NOTE — Plan of Care (Signed)

## 2024-03-15 NOTE — Progress Notes (Signed)
 PROGRESS NOTE    Rhonda [name]  EXB:[id number] DOB: October 11, 1951 DOA: 03/14/2024 PCP: Allene Ivan, MD    Brief Narrative:  73 year old with history of interstitial lung disease on chronic prednisone , pulmonary HTN, IBS, steroid-induced DM, palpitations on Cardizem  comes to the hospital acute hypoxic respiratory distress, persistent,, DOE.  She was recently started on CellCept  in efforts to reduce steroids by pulmonary team.   Assessment & Plan:  Principal Problem:   Interstitial lung disease (HCC)    Dyspnea on exertion Chronic cough Presume ILD flare Rhinovirus infection -Respiratory panel is positive for rhinovirus.  Possibly inducing this ILD flare -Patient follows outpatient with pulmonary team but due to worsening status for now has been admitted to the hospital.  Plan is to give Solu-Medrol  IV for total of 3 days followed by prednisone  taper. - Multiple cultures have been ordered Aggressive bronchodilator treatment. -CT chest has been reviewed showing coarse opacities   Palpitations -continue Cardizem , metoprolol .  IV as needed   Steroid-induced diabetes - A1c - Sliding scale and Accu-Cheks   Hyperlipidemia -Zetia , statin   DVT prophylaxis:Lovenox     Code Status: Full Code Family Communication:   Continue hospital stay for IV steroids    Subjective: Feeling better no complaints today besides dyspnea on exertion   Examination:  General exam: Appears calm and comfortable  Respiratory system: Some bilateral rhonchi Cardiovascular system: S1 & S2 heard, RRR. No JVD, murmurs, rubs, gallops or clicks. No pedal edema. Gastrointestinal system: Abdomen is nondistended, soft and nontender. No organomegaly or masses felt. Normal bowel sounds heard. Central nervous system: Alert and oriented. No focal neurological deficits. Extremities: Symmetric 5 x 5 power. Skin: No rashes, lesions or ulcers Psychiatry: Judgement and insight appear normal. Mood & affect  appropriate.                Diet Orders (From admission, onward)     Start     Ordered   03/14/24 1628  Diet Carb Modified Fluid consistency: Thin; Room service appropriate? Yes  Diet effective now       Comments: NO GARLIC , NO ONIONS, NO SHELLFISH  Question Answer Comment  Diet-HS Snack? Nothing   Calorie Level Medium 1600-2000   Fluid consistency: Thin   Room service appropriate? Yes      03/14/24 1628            Objective: Vitals:   03/14/24 1957 03/14/24 2335 03/15/24 0538 03/15/24 0805  BP: (!) 140/77 125/72 (!) 141/72   Pulse: 68 73 62   Resp: 16 16 15    Temp: 98 F (36.7 C) 98 F (36.7 C) 98.1 F (36.7 C)   TempSrc: Oral Oral Oral   SpO2: 98% 97% 97% 94%  Weight:      Height:        Intake/Output Summary (Last 24 hours) at 03/15/2024 1151 Last data filed at 03/15/2024 0800 Gross per 24 hour  Intake 118 ml  Output --  Net 118 ml   Filed Weights   03/14/24 1238  Weight: 64 kg    Scheduled Meds:  budesonide  (PULMICORT ) nebulizer solution  0.5 mg Nebulization BID   diltiazem   120 mg Oral Daily   enoxaparin  (LOVENOX ) injection  40 mg Subcutaneous Q24H   ezetimibe   10 mg Oral QHS   fluticasone  furoate-vilanterol  1 puff Inhalation Daily   insulin  aspart  0-15 Units Subcutaneous TID WC   insulin  aspart  0-5 Units Subcutaneous QHS   insulin  aspart  8 Units Subcutaneous  TID WC   insulin  glargine-yfgn  16 Units Subcutaneous Daily   loratadine   10 mg Oral QHS   metoprolol  succinate  25 mg Oral Daily   montelukast   10 mg Oral QHS   pantoprazole   40 mg Oral Q1200   pravastatin   40 mg Oral QHS   Continuous Infusions:  methylPREDNISolone  (SOLU-MEDROL ) injection 250 mg (03/15/24 0837)    Nutritional status     Body mass index is 28.48 kg/m.  Data Reviewed:   CBC: Recent Labs  Lab 03/14/24 1248 03/15/24 0559  WBC 19.7* 12.3*  NEUTROABS 17.8*  --   HGB 13.5 12.9  HCT 44.1 43.0  MCV 86.5 88.7  PLT 230 183   Basic Metabolic  Panel: Recent Labs  Lab 03/14/24 1248 03/15/24 0559  NA 138 140  K 4.3 4.3  CL 105 100  CO2 22 31  GLUCOSE 129* 114*  BUN 37* 27*  CREATININE 0.82 0.91  CALCIUM  9.4 8.9   GFR: Estimated Creatinine Clearance: 43.4 mL/min (by C-G formula based on SCr of 0.91 mg/dL). Liver Function Tests: Recent Labs  Lab 03/14/24 1248  AST 22  ALT 25  ALKPHOS 43  BILITOT 0.5  PROT 6.6  ALBUMIN 3.4*   No results for input(s): "LIPASE", "AMYLASE" in the last 168 hours. No results for input(s): "AMMONIA" in the last 168 hours. Coagulation Profile: No results for input(s): "INR", "PROTIME" in the last 168 hours. Cardiac Enzymes: No results for input(s): "CKTOTAL", "CKMB", "CKMBINDEX", "TROPONINI" in the last 168 hours. BNP (last 3 results) Recent Labs    03/18/23 1402 03/06/24 0934  PROBNP 71.0 70.0   HbA1C: Recent Labs    03/15/24 0559  HGBA1C 7.2*   CBG: Recent Labs  Lab 03/14/24 1651 03/14/24 1959 03/15/24 0708  GLUCAP 136* 144* 98   Lipid Profile: No results for input(s): "CHOL", "HDL", "LDLCALC", "TRIG", "CHOLHDL", "LDLDIRECT" in the last 72 hours. Thyroid  Function Tests: No results for input(s): "TSH", "T4TOTAL", "FREET4", "T3FREE", "THYROIDAB" in the last 72 hours. Anemia Panel: No results for input(s): "VITAMINB12", "FOLATE", "FERRITIN", "TIBC", "IRON", "RETICCTPCT" in the last 72 hours. Sepsis Labs: Recent Labs  Lab 03/14/24 1248  PROCALCITON <0.10    Recent Results (from the past 240 hours)  Respiratory or Resp and Sputum Culture     Status: None   Collection Time: 03/06/24  9:54 AM   Specimen: Induced sputum  Result Value Ref Range Status   MICRO NUMBER: [id number]  Final   SPECIMEN QUALITY: Inadequate  Final   Source SPUTUM  Final   STATUS: FINAL  Final   GRAM STAIN:   Final    The sputum specimen submitted contains 25 or more squamous epithelial cells per low power field and is unacceptable for culture due to oropharyngeal contamination. Please  resubmit another specimen if clinically indicated.   RESULT: CANCELED      Comment: Result canceled by the ancillary.  Resp panel by RT-PCR (RSV, Flu A&B, Covid) Anterior Nasal Swab     Status: None   Collection Time: 03/14/24 12:29 PM   Specimen: Anterior Nasal Swab  Result Value Ref Range Status   SARS Coronavirus 2 by RT PCR NEGATIVE NEGATIVE Final    Comment: (NOTE) SARS-CoV-2 target nucleic acids are NOT DETECTED.  The SARS-CoV-2 RNA is generally detectable in upper respiratory specimens during the acute phase of infection. The lowest concentration of SARS-CoV-2 viral copies this assay can detect is 138 copies/mL. A negative result does not preclude SARS-Cov-2 infection and should not be  used as the sole basis for treatment or other patient management decisions. A negative result may occur with  improper specimen collection/handling, submission of specimen other than nasopharyngeal swab, presence of viral mutation(s) within the areas targeted by this assay, and inadequate number of viral copies(<138 copies/mL). A negative result must be combined with clinical observations, patient history, and epidemiological information. The expected result is Negative.  Fact Sheet for Patients:  BloggerCourse.com  Fact Sheet for Healthcare Providers:  SeriousBroker.it  This test is no t yet approved or cleared by the [place]  FDA and  has been authorized for detection and/or diagnosis of SARS-CoV-2 by FDA under an Emergency Use Authorization (EUA). This EUA will remain  in effect (meaning this test can be used) for the duration of the COVID-19 declaration under Section 564(b)(1) of the Act, 21 U.S.C.section 360bbb-3(b)(1), unless the authorization is terminated  or revoked sooner.       Influenza A by PCR NEGATIVE NEGATIVE Final   Influenza B by PCR NEGATIVE NEGATIVE Final    Comment: (NOTE) The Xpert Xpress SARS-CoV-2/FLU/RSV  plus assay is intended as an aid in the diagnosis of influenza from Nasopharyngeal swab specimens and should not be used as a sole basis for treatment. Nasal washings and aspirates are unacceptable for Xpert Xpress SARS-CoV-2/FLU/RSV testing.  Fact Sheet for Patients: BloggerCourse.com  Fact Sheet for Healthcare Providers: SeriousBroker.it  This test is not yet approved or cleared by the [place]  FDA and has been authorized for detection and/or diagnosis of SARS-CoV-2 by FDA under an Emergency Use Authorization (EUA). This EUA will remain in effect (meaning this test can be used) for the duration of the COVID-19 declaration under Section 564(b)(1) of the Act, 21 U.S.C. section 360bbb-3(b)(1), unless the authorization is terminated or revoked.     Resp Syncytial Virus by PCR NEGATIVE NEGATIVE Final    Comment: (NOTE) Fact Sheet for Patients: BloggerCourse.com  Fact Sheet for Healthcare Providers: SeriousBroker.it  This test is not yet approved or cleared by the [place]  FDA and has been authorized for detection and/or diagnosis of SARS-CoV-2 by FDA under an Emergency Use Authorization (EUA). This EUA will remain in effect (meaning this test can be used) for the duration of the COVID-19 declaration under Section 564(b)(1) of the Act, 21 U.S.C. section 360bbb-3(b)(1), unless the authorization is terminated or revoked.  Performed at [facility], [address] [place], [place] [address]   Respiratory (~20 pathogens) panel by PCR     Status: Abnormal   Collection Time: 03/14/24 12:29 PM   Specimen: Nasopharyngeal Swab; Respiratory  Result Value Ref Range Status   Adenovirus NOT DETECTED NOT DETECTED Final   Coronavirus 229E NOT DETECTED NOT DETECTED Final    Comment: (NOTE) The Coronavirus on the Respiratory Panel, DOES NOT test for the novel   Coronavirus (2019 nCoV)    Coronavirus HKU1 NOT DETECTED NOT DETECTED Final   Coronavirus NL63 NOT DETECTED NOT DETECTED Final   Coronavirus OC43 NOT DETECTED NOT DETECTED Final   Metapneumovirus NOT DETECTED NOT DETECTED Final   Rhinovirus / Enterovirus DETECTED (A) NOT DETECTED Final   Influenza A NOT DETECTED NOT DETECTED Final   Influenza B NOT DETECTED NOT DETECTED Final   Parainfluenza Virus 1 NOT DETECTED NOT DETECTED Final   Parainfluenza Virus 2 NOT DETECTED NOT DETECTED Final   Parainfluenza Virus 3 NOT DETECTED NOT DETECTED Final   Parainfluenza Virus 4 NOT DETECTED NOT DETECTED Final   Respiratory Syncytial Virus NOT DETECTED NOT DETECTED  Final   Bordetella pertussis NOT DETECTED NOT DETECTED Final   Bordetella Parapertussis NOT DETECTED NOT DETECTED Final   Chlamydophila pneumoniae NOT DETECTED NOT DETECTED Final   Mycoplasma pneumoniae NOT DETECTED NOT DETECTED Final    Comment: Performed at [facility], [address] [place], [place] [address]         Radiology Studies: CT CHEST WO CONTRAST Result Date: 03/14/2024 CLINICAL DATA:  Pulmonary fibrosis. Lung nodule. Shortness of breath. EXAM: CT CHEST WITHOUT CONTRAST TECHNIQUE: Multidetector CT imaging of the chest was performed following the standard protocol without IV contrast. RADIATION DOSE REDUCTION: This exam was performed according to the departmental dose-optimization program which includes automated exposure control, adjustment of the mA and/or kV according to patient size and/or use of iterative reconstruction technique. COMPARISON:  Chest CT dated 03/08/2024. FINDINGS: Evaluation of this exam is limited in the absence of intravenous contrast. Cardiovascular: There is no cardiomegaly or pericardial effusion. Three-vessel coronary vascular calcification. Mild atherosclerotic calcification of the thoracic aorta. No aneurysmal dilatation. The central pulmonary arteries are grossly unremarkable.  Mediastinum/Nodes: No hilar or mediastinal adenopathy. The esophagus is grossly unremarkable no mediastinal fluid collection. Lungs/Pleura: Similar appearance of diffuse interstitial coarsening, reticulation, and bronchiectasis in keeping with interstitial lung disease. No focal consolidation, pleural effusion, or pneumothorax. The central airways are patent. Upper Abdomen: No acute abnormality. Musculoskeletal: Osteopenia with degenerative changes. No acute osseous pathology. IMPRESSION: 1. Similar appearance of interstitial lung disease. No focal consolidation. 2. Three-vessel coronary vascular calcification. 3.  Aortic Atherosclerosis (ICD10-I70.0). Electronically Signed   By: Angus Bark M.D.   On: 03/14/2024 16:15   DG Chest 2 View Result Date: 03/14/2024 CLINICAL DATA:  Cough and dyspnea EXAM: CHEST - 2 VIEW COMPARISON:  March 06, 2024 FINDINGS: Chronic interstitial lung disease. Without evidence of acute infiltrates or consolidations. Overall no significant change since prior examination No pleural effusions.  Heart and mediastinum normal IMPRESSION: Chronic interstitial lung disease. Electronically Signed   By: Fredrich Jefferson M.D.   On: 03/14/2024 14:50           LOS: 1 day   Time spent= 35 mins    Maggie Schooner, MD [facility]  If 7PM-7AM, please contact night-coverage  03/15/2024, 11:51 AM

## 2024-03-15 NOTE — Consult Note (Signed)
 NAME:  Rhonda [name], MRN:  [id number], DOB:  29-Nov-1950, LOS: 1 ADMISSION DATE:  03/14/2024, CONSULTATION DATE:  03/14/24 REFERRING MD:  Aldean Amass - EM , CHIEF COMPLAINT:  SOB    History of Present Illness:  73 yo F PMH ILD r/t hypersensitivity pneumonitis on chronic pred but not pcp ppx, environmental allergies on immunotherapy, pHTN, IBS, DM, who presented to [facility] ED 03/14/24 at referral of Dr. Bertrum Brodie w [facility].   In review of outpt notes, it looks like she has had persistent cough/DOE since March 2025. She started Cellcept  around 02/03/24. Has been Rx doxy + prednisone  (3/26), later cefpodoxime  was added for broader coverage after CXR revealed L sided opacities. After completing these, sx returned and 4/7 PCP restarted her abx. Looks  like pt had increased her pred from 20mg  to 30mg  given her sx recurrence  and had held her Cellcept  2/2 acute illness. She had not been on cellcept  long enough to up-titrate at all.  She presented to pulm clinic 4/14 where steroids wer inc to 40mg  x7d then 30mg  x7 then 20mg  Ddimer ok, AFB and fungal cx were sent (still pending at time of ED presentation) & a CT chest was obtained which has diffuse GGOs + a new LLL 18x54mm nodule, for which pt was started on levaquin    Prescribed Levaquin  and increase prednisone  to 40 mg daily last week. This has helped dry up her cough. More clear compared to yellow. Her dyspnea is slightly better. Still needing oxygen  more feels like her oxygen  saturations are dropping more. But overall things have improved. Reviewed her CT scan which recently 01/2023 that demonstrated similar pattern of fibrosis largely unchanged with possibly worsening groundglass opacities compared to 2022, compared to 03/08/2024 with increased groundglass opacities compared to CT scan today which shows overall increased from 01/2023 but decreased from previous scan last week 02/2024. Given imaging findings and improvement with prednisone  I think is most likely  represents flare of underlying HSP. Fear this flare inflammation could lead to worsening fibrosis. Her procalcitonin is negative. Small nodular opacity seen for 16/25 is now resolved on CT scan performed today. We discussed role and rationale for bronchoscopy. Given her worsening hypoxemia there is a high likelihood she will have worsening ox requirement since she has had severe coughing fits with endotracheal intubation in the past. Given this and likely low benefit, low suspicion for PJP or other infectious pathogen, recommended. Treatment with IV steroids then ongoing increase in oral steroid dose with taper over time. She expressed desire to not undergo bronchoscopy. After shared decision making we agreed to empiric IV steroid treatment.     Pertinent  Medical History  GERD CAD IBS Depression DM HLD pHTN Hypersensitivity pneumonitis ILD   Significant Hospital Events: Including procedures, antibiotic start and stop dates in addition to other pertinent events   4/22 ED at referral of outpt pulm.   Interim History / Subjective:  Feels a little better, rhinovirus on RVP  Objective   Blood pressure (!) 131/59, pulse 72, temperature 98.3 F (36.8 C), resp. rate 18, height 4\' 11"  (1.499 m), weight 64 kg, SpO2 97%.        Intake/Output Summary (Last 24 hours) at 03/15/2024 1619 Last data filed at 03/15/2024 0800 Gross per 24 hour  Intake 118 ml  Output --  Net 118 ml   Filed Weights   03/14/24 1238  Weight: 64 kg    Examination: General: Very pleasant chronically ill appearing older adult F NAD in bed  HENT: NCAT pink mm anicteric sclera  Lungs: Even and unlabored, symmetrical chest expansion, NWOB Cardiovascular: rrr Abdomen: soft round  Extremities: no acute joint deformity  Neuro: AAOx4   Resolved Hospital Problem list     Assessment & Plan:   Worsening dyspnea and hypoxemia in setting of presumed ILD flare due to rhinovirus: Increasing glass opacities,  procalcitonin negative, pattern not consistent with PJP, improved with steroids in the interim, prior small dense nodular opacity has resolved. Rhinovirus positive on RVP.  -- Solu-Medrol  250 mg IV daily x 3 days (4/23-4/25) then oral prednisone  taper thereafter -- Continue breathing treatments, airway clearance etc. -- Lower respiratory culture, pjp dfa smear, fungitell ordered -- Will need to closely monitor blood sugars and increase insulin  as needed  Best Practice (right click and "Reselect all SmartList Selections" daily)   Diet/type: Regular consistency (see orders) DVT prophylaxis per primary  Pressure ulcer(s): N/A GI prophylaxis: PPI Lines: N/A Foley:  N/A Code Status:  prior listed is Full. Did not discuss at time of consultation.  Last date of multidisciplinary goals of care discussion [4/22]  Labs   CBC: Recent Labs  Lab 03/14/24 1248 03/15/24 0559  WBC 19.7* 12.3*  NEUTROABS 17.8*  --   HGB 13.5 12.9  HCT 44.1 43.0  MCV 86.5 88.7  PLT 230 183    Basic Metabolic Panel: Recent Labs  Lab 03/14/24 1248 03/15/24 0559  NA 138 140  K 4.3 4.3  CL 105 100  CO2 22 31  GLUCOSE 129* 114*  BUN 37* 27*  CREATININE 0.82 0.91  CALCIUM  9.4 8.9   GFR: Estimated Creatinine Clearance: 43.4 mL/min (by C-G formula based on SCr of 0.91 mg/dL). Recent Labs  Lab 03/14/24 1248 03/15/24 0559  PROCALCITON <0.10  --   WBC 19.7* 12.3*    Liver Function Tests: Recent Labs  Lab 03/14/24 1248  AST 22  ALT 25  ALKPHOS 43  BILITOT 0.5  PROT 6.6  ALBUMIN 3.4*   No results for input(s): "LIPASE", "AMYLASE" in the last 168 hours. No results for input(s): "AMMONIA" in the last 168 hours.  ABG    Component Value Date/Time   PHART 7.410 09/29/2012 0500   PCO2ART 41.1 09/29/2012 0500   PO2ART 58.7 (L) 09/29/2012 0500   HCO3 25.5 (H) 09/29/2012 0500   TCO2 26.8 09/29/2012 0500   ACIDBASEDEF 0.9 09/26/2012 1143   O2SAT 91.3 09/29/2012 0500     Coagulation  Profile: No results for input(s): "INR", "PROTIME" in the last 168 hours.  Cardiac Enzymes: No results for input(s): "CKTOTAL", "CKMB", "CKMBINDEX", "TROPONINI" in the last 168 hours.  HbA1C: Hgb A1c MFr Bld  Date/Time Value Ref Range Status  03/15/2024 05:59 AM 7.2 (H) 4.8 - 5.6 % Final    Comment:    (NOTE) Pre diabetes:          5.7%-6.4%  Diabetes:              >6.4%  Glycemic control for   <7.0% adults with diabetes   09/28/2019 12:26 PM 6.2 (H) 4.8 - 5.6 % Final    Comment:    (NOTE) Pre diabetes:          5.7%-6.4% Diabetes:              >6.4% Glycemic control for   <7.0% adults with diabetes     CBG: Recent Labs  Lab 03/14/24 1651 03/14/24 1959 03/15/24 0708 03/15/24 1138 03/15/24 1559  GLUCAP 136* 144* 98 104* 106*  Review of Systems:   N/a   Past Medical History:  She,  has a past medical history of Allergy, Arthritis, Asthma, Cataract, Coronary artery calcification seen on CAT scan (08/19/2017), DDD (degenerative disc disease), thoracic, Depression, DOE (dyspnea on exertion), Fatty liver, GERD (gastroesophageal reflux disease), H/O steroid therapy, Heart palpitations (02/28/2015), Helicobacter pylori ab+, Hemorrhoids, Hiatal hernia, High cholesterol, History of chronic bronchitis, History of migraine, History of MRSA infection, Hyperlipidemia, mixed (08/19/2017), Hyperplastic colon polyp (2007), IBS (irritable bowel syndrome), Inguinal hernia, Insulin  resistance, Interstitial lung disease (HCC), MVP (mitral valve prolapse), Pneumonia, Pneumonitis, hypersensitivity (HCC), PONV (postoperative nausea and vomiting), Pre-diabetes, Pulmonary fibrosis (HCC), PVC (premature ventricular contraction) (08/19/2017), Rapid heart rate, Squamous cell carcinoma of skin, Tinnitus, right ear, and Vocal cord ulcer.   Surgical History:   Past Surgical History:  Procedure Laterality Date   74 HOUR PH STUDY N/A 02/21/2018   Procedure: 24 HOUR PH STUDY;  Surgeon: Sergio Dandy, MD;  Location: [facility] ENDOSCOPY;  Service: Endoscopy;  Laterality: N/A;   BREAST BIOPSY Right 2009   BIOPSY, pt denies   BREAST BIOPSY Left 2003   Benign    CATARACT EXTRACTION Left    CESAREAN SECTION     COLONOSCOPY     ESOPHAGEAL MANOMETRY N/A 02/21/2018   Procedure: ESOPHAGEAL MANOMETRY (EM);  Surgeon: Sergio Dandy, MD;  Location: [facility];  Service: Endoscopy;  Laterality: N/A;   FOOT FRACTURE SURGERY  2006 or 2007   right   HYMENECTOMY     LUNG BIOPSY  09/28/2012   Procedure: LUNG BIOPSY;  Surgeon: Zelphia Higashi, MD;  Location: [facility] OR;  Service: Thoracic;  Laterality: N/A;  lung biopsies tims three   SQUAMOUS CELL CARCINOMA EXCISION Left    left arm   TOTAL HIP ARTHROPLASTY Right 09/10/2015   Procedure: RIGHT TOTAL HIP ARTHROPLASTY ANTERIOR APPROACH;  Surgeon: Claiborne Crew, MD;  Location: [facility] ORS;  Service: Orthopedics;  Laterality: Right;   TOTAL HIP ARTHROPLASTY Left 10/03/2019   Procedure: TOTAL HIP ARTHROPLASTY ANTERIOR APPROACH;  Surgeon: Claiborne Crew, MD;  Location: [facility] ORS;  Service: Orthopedics;  Laterality: Left;  70 mins   TUBAL LIGATION     UPPER GI ENDOSCOPY     VIDEO ASSISTED THORACOSCOPY  09/28/2012   Procedure: VIDEO ASSISTED THORACOSCOPY;  Surgeon: Zelphia Higashi, MD;  Location: [facility] OR;  Service: Thoracic;  Laterality: Right;   VIDEO BRONCHOSCOPY  11/19/2011   Procedure: VIDEO BRONCHOSCOPY WITH FLUORO;  Surgeon: Maire Scot, MD;  Location: [facility] ENDOSCOPY;  Service: Endoscopy;;     Social History:   reports that she has never smoked. She has never used smokeless tobacco. She reports current alcohol use of about 2.0 standard drinks of alcohol per week. She reports that she does not use drugs.   Family History:  Her family history includes Allergic rhinitis in her daughter; Asthma in her maternal grandmother and mother; Bone cancer in her paternal grandfather; Breast cancer in her cousin, cousin, cousin, paternal aunt, and paternal aunt;  Dementia (age of onset: 58) in her mother; Diabetes in her father; Emphysema in her maternal grandmother; Heart disease in her sister; Hypertension in her sister; Kidney disease in her sister; Lung disease in her maternal grandfather; Lymphoma in her father; Osteoarthritis in her mother; Ovarian cancer in her maternal aunt. There is no history of Colon cancer, Angioedema, Eczema, Immunodeficiency, or Urticaria.   Allergies Allergies  Allergen Reactions   Ozempic (0.25 Or 0.5 Mg-Dose) [Semaglutide(0.25 Or 0.5mg -Dos)] Other (See Comments)    "  Paralyzed the stomach"   Tape Other (See Comments)    SKIN IS VERY FRAGILE!! "Burns and pulls up" the skin!! SKIN BRUISES and TEARS EASILY.Aaron Aas   Atorvastatin Other (See Comments)    Leg pain   Betadine [Povidone Iodine] Other (See Comments)    Blisters    Codeine Nausea And Vomiting   Garlic Diarrhea   Hydrocodone  Nausea And Vomiting   Macrobid [Nitrofurantoin] Other (See Comments)   Ofev  [Nintedanib] Other (See Comments)    Abdominal pain   Onion Diarrhea   Rosuvastatin Other (See Comments)    Leg pain   Shellfish Allergy Nausea And Vomiting   Statins Other (See Comments)    "Leg pain," but Pravachol  is tolerated   Sulfa  Antibiotics Other (See Comments)    Headaches      Home Medications  Prior to Admission medications   Medication Sig Start Date End Date Taking? Authorizing Provider  acetaminophen  (TYLENOL ) 500 MG tablet Tylenol  Extra Strength 08/24/19   [provider]  albuterol  (PROAIR  HFA) 108 (90 Base) MCG/ACT inhaler Inhale 2 puffs into the lungs every 4 (four) hours as needed for wheezing. Or coughing spells.  You may use 2 Puffs 5-10 minutes before exercise. 04/25/18   Ambs, Jeanmarie Millet, FNP  Alpha-D-Galactosidase (BEANO PO) Take 1 tablet by mouth as needed (if eating onion or garlic).     [provider]  AMBULATORY NON FORMULARY MEDICATION Allergy injections Once a week    [provider]  Ascorbic Acid (VITAMIN  C) 1000 MG tablet Take 1,000 mg by mouth daily.    [provider]  ASHWAGANDHA PO Take 2 capsules by mouth daily.    [provider]  BREO ELLIPTA  100-25 MCG/ACT AEPB Inhale 1 puff into the lungs daily. 03/07/24   Raejean Bullock, NP  budesonide  (PULMICORT ) 0.5 MG/2ML nebulizer solution Take 2 mLs (0.5 mg total) by nebulization in the morning and at bedtime. 02/16/24   Cobb, Mariah Shines, NP  Calcium  Carb-Cholecalciferol (CALCIUM  500 + D PO) Take 2 tablets by mouth daily at 6 (six) AM.    [provider]  chlorpheniramine (CHLOR-TRIMETON) 4 MG tablet Take 2 mg by mouth 2 (two) times daily. Takes half tablet twice daily    [provider]  cholecalciferol (VITAMIN D3) 25 MCG (1000 UNIT) tablet Take 1,000 Units by mouth every other day.    [provider]  ciclopirox (PENLAC) 8 % solution See admin instructions. 02/09/23   [provider]  Continuous Glucose Sensor (FREESTYLE LIBRE 3 SENSOR) MISC check blood sugar fasting and 2 hours after largest meal 01/28/24   [provider]  dicyclomine  (BENTYL ) 10 MG capsule Take 1 capsule by mouth daily as needed. 08/16/18   [provider]  diltiazem  (CARDIZEM  CD) 120 MG 24 hr capsule Take 1 capsule (120 mg total) by mouth daily. 09/28/23   Eilleen Grates, MD  diphenhydrAMINE -PE-APAP (DELSYM COUGH/COLD NIGHT TIME) 12.5-5-325 MG/10ML LIQD Take by mouth as needed.    [provider]  EPINEPHrine  0.3 mg/0.3 mL IJ SOAJ injection Inject into the muscle.    [provider]  ezetimibe  (ZETIA ) 10 MG tablet Take 1 tablet (10 mg total) by mouth daily. 02/10/24   Eilleen Grates, MD  fluticasone  (FLONASE ) 50 MCG/ACT nasal spray Spray 2 sprays into each nostril every day. 03/06/24   Maire Scot, MD  HUMALOG  KWIKPEN 100 UNIT/ML KwikPen Taken 9 unit. 01/27/24   [provider]  ipratropium-albuterol  (DUONEB) 0.5-2.5 (3) MG/3ML SOLN USE ONE VIAL  IN NEBULIZER EVERY 6 HOURS AS NEEDED  02/14/24   [provider]  lactase (LACTAID) 3000 units tablet Take by mouth.    [provider]  LANTUS  SOLOSTAR 100 UNIT/ML Solostar Pen Inject 14 Units into the skin daily. 01/03/24   [provider]  levocetirizine (XYZAL ) 5 MG tablet Take 5 mg by mouth at bedtime. 09/26/20   [provider]  levofloxacin  (LEVAQUIN ) 500 MG tablet Take 1 tablet (500 mg total) by mouth daily for 10 days. 03/09/24 03/19/24  Cobb, Mariah Shines, NP  metFORMIN  (GLUCOPHAGE -XR) 500 MG 24 hr tablet Take 500 mg by mouth 2 (two) times daily with a meal. 07/08/23   [provider]  metoprolol  succinate (TOPROL -XL) 25 MG 24 hr tablet Take 1 tablet (25 mg total) by mouth daily. 06/10/23   Eilleen Grates, MD  montelukast  (SINGULAIR ) 10 MG tablet TAKE 1 TABLET BY MOUTH EVERYDAY AT BEDTIME 01/29/20   Maire Scot, MD  Multiple Vitamin (MULTIVITAMIN) tablet Take 1 tablet by mouth daily.    [provider]  mycophenolate  (CELLCEPT ) 500 MG tablet Take 1 tab twice daily x 2 weeks. REPEAT LABS. If stable, increase to 2 tabs twice daily x 2 weeks. REPEAT LABS. If stable, increase to 3 tabs twice daily Patient not taking: Reported on 03/06/2024 02/07/24   Maire Scot, MD  nitroGLYCERIN  (NITROSTAT ) 0.4 MG SL tablet Place 1 tablet (0.4 mg total) under the tongue every 5 (five) minutes as needed for chest pain. 12/06/18 10/06/23  Kroeger, Irvine Mantis., PA-C  nystatin-triamcinolone  (MYCOLOG II) cream nystatin-triamcinolone  100,000 unit/g-0.1 % topical cream    [provider]  omeprazole  (PRILOSEC  OTC) 20 MG tablet Take 10 mg by mouth daily.    [provider]  OXYGEN  Inhale into the lungs. Up to 6 liters as needed during the day and nite time use on/off    [provider]  pravastatin  (PRAVACHOL ) 40 MG tablet Take 1 tablet (40 mg total) by mouth every evening. 06/12/20   Eilleen Grates, MD  predniSONE  (DELTASONE ) 10 MG tablet Take 1 tablet (10 mg total) by mouth  daily with breakfast. Patient taking differently: Take 20 mg by mouth daily with breakfast. 06/08/23   Maire Scot, MD  predniSONE  (DELTASONE ) 10 MG tablet Take 4 tablets for 3 days then 3 tablets for 3 days then return to 20 mg daily 02/16/24   Cobb, Mariah Shines, NP  Sodium Chloride , Inhalant, 7 % NEBU Use per nebulizer once a day to help clear sputum 07/08/21   Maire Scot, MD  sulfamethoxazole -trimethoprim  (BACTRIM  DS) 800-160 MG tablet Take 1 tablet by mouth 3 (three) times a week. (START after completing two weeks of CELLCEPT  1000mg  twice daily ) Patient not taking: Reported on 03/06/2024 02/07/24   Maire Scot, MD     Critical care time: na       Guerry Leek, MD [facility] Pulmonary/Critical Care Medicine Amion for pager  03/15/2024, 4:19 PM

## 2024-03-16 DIAGNOSIS — J849 Interstitial pulmonary disease, unspecified: Secondary | ICD-10-CM | POA: Diagnosis not present

## 2024-03-16 LAB — BASIC METABOLIC PANEL WITH GFR
Anion gap: 8 (ref 5–15)
BUN: 32 mg/dL — ABNORMAL HIGH (ref 8–23)
CO2: 26 mmol/L (ref 22–32)
Calcium: 8.9 mg/dL (ref 8.9–10.3)
Chloride: 102 mmol/L (ref 98–111)
Creatinine, Ser: 0.96 mg/dL (ref 0.44–1.00)
GFR, Estimated: >60 mL/min (ref >60)
Glucose, Bld: 199 mg/dL — ABNORMAL HIGH (ref 70–99)
Potassium: 4.6 mmol/L (ref 3.5–5.1)
Sodium: 136 mmol/L (ref 135–145)

## 2024-03-16 LAB — CBC
HCT: 44.1 % (ref 36.0–46.0)
Hemoglobin: 13 g/dL (ref 12.0–15.0)
MCH: 26.1 pg (ref 26.0–34.0)
MCHC: 29.5 g/dL — ABNORMAL LOW (ref 30.0–36.0)
MCV: 88.6 fL (ref 80.0–100.0)
Platelets: 201 10*3/uL (ref 150–400)
RBC: 4.98 MIL/uL (ref 3.87–5.11)
RDW: 16.3 % — ABNORMAL HIGH (ref 11.5–15.5)
WBC: 16.2 10*3/uL — ABNORMAL HIGH (ref 4.0–10.5)
nRBC: 0 % (ref 0.0–0.2)

## 2024-03-16 LAB — GLUCOSE, CAPILLARY
Glucose-Capillary: 150 mg/dL — ABNORMAL HIGH (ref 70–99)
Glucose-Capillary: 68 mg/dL — ABNORMAL LOW (ref 70–99)
Glucose-Capillary: 190 mg/dL — ABNORMAL HIGH (ref 70–99)
Glucose-Capillary: 228 mg/dL — ABNORMAL HIGH (ref 70–99)
Glucose-Capillary: 277 mg/dL — ABNORMAL HIGH (ref 70–99)

## 2024-03-16 LAB — BRAIN NATRIURETIC PEPTIDE: B Natriuretic Peptide: 77.5 pg/mL (ref 0.0–100.0)

## 2024-03-16 LAB — MAGNESIUM: Magnesium: 2.4 mg/dL (ref 1.7–2.4)

## 2024-03-16 MED ORDER — FLUTICASONE PROPIONATE 50 MCG/ACT NA SUSP
1.0000 | Freq: Every day | NASAL | Status: DC
  Administered 2024-03-16 – 2024-03-17 (×2): 1 via NASAL
  Filled 2024-03-16: qty 16

## 2024-03-16 MED ORDER — SULFAMETHOXAZOLE-TRIMETHOPRIM 800-160 MG PO TABS
1.0000 | ORAL_TABLET | ORAL | Status: DC
  Administered 2024-03-17: 1 via ORAL
  Filled 2024-03-16: qty 1

## 2024-03-16 NOTE — Progress Notes (Signed)
 NAME:  Rhonda [name], MRN:  [id number], DOB:  05/15/1951, LOS: 2 ADMISSION DATE:  03/14/2024, CONSULTATION DATE:  03/14/24 REFERRING MD:  Aldean Amass - EM , CHIEF COMPLAINT:  SOB    History of Present Illness:  73 yo F PMH ILD r/t hypersensitivity pneumonitis on chronic pred but not pcp ppx, environmental allergies on immunotherapy, pHTN, IBS, DM, who presented to [facility] ED 03/14/24 at referral of Dr. Bertrum Brodie w [facility].   In review of outpt notes, it looks like she has had persistent cough/DOE since March 2025. She started Cellcept  around 02/03/24. Has been Rx doxy + prednisone  (3/26), later cefpodoxime  was added for broader coverage after CXR revealed L sided opacities. After completing these, sx returned and 4/7 PCP restarted her abx. Looks  like pt had increased her pred from 20mg  to 30mg  given her sx recurrence  and had held her Cellcept  2/2 acute illness. She had not been on cellcept  long enough to up-titrate at all.  She presented to pulm clinic 4/14 where steroids wer inc to 40mg  x7d then 30mg  x7 then 20mg  Ddimer ok, AFB and fungal cx were sent (still pending at time of ED presentation) & a CT chest was obtained which has diffuse GGOs + a new LLL 18x67mm nodule, for which pt was started on levaquin    Prescribed Levaquin  and increase prednisone  to 40 mg daily last week. This has helped dry up her cough. More clear compared to yellow. Her dyspnea is slightly better. Still needing oxygen  more feels like her oxygen  saturations are dropping more. But overall things have improved. Reviewed her CT scan which recently 01/2023 that demonstrated similar pattern of fibrosis largely unchanged with possibly worsening groundglass opacities compared to 2022, compared to 03/08/2024 with increased groundglass opacities compared to CT scan today which shows overall increased from 01/2023 but decreased from previous scan last week 02/2024. Given imaging findings and improvement with prednisone  I think is most likely  represents flare of underlying HSP. Fear this flare inflammation could lead to worsening fibrosis. Her procalcitonin is negative. Small nodular opacity seen for 16/25 is now resolved on CT scan performed today. We discussed role and rationale for bronchoscopy. Given her worsening hypoxemia there is a high likelihood she will have worsening ox requirement since she has had severe coughing fits with endotracheal intubation in the past. Given this and likely low benefit, low suspicion for PJP or other infectious pathogen, recommended. Treatment with IV steroids then ongoing increase in oral steroid dose with taper over time. She expressed desire to not undergo bronchoscopy. After shared decision making we agreed to empiric IV steroid treatment.     Pertinent  Medical History  GERD CAD IBS Depression DM HLD pHTN Hypersensitivity pneumonitis ILD   Significant Hospital Events: Including procedures, antibiotic start and stop dates in addition to other pertinent events   4/22 ED at referral of outpt pulm.   Interim History / Subjective:  Feels a little better, rhinovirus on RVP. Needed less O2 with exertion, subjective DOE improved  Objective   Blood pressure 135/67, pulse 60, temperature 97.9 F (36.6 C), resp. rate 18, height 4\' 11"  (1.499 m), weight 64 kg, SpO2 96%.        Intake/Output Summary (Last 24 hours) at 03/16/2024 0915 Last data filed at 03/16/2024 0300 Gross per 24 hour  Intake 294 ml  Output --  Net 294 ml   Filed Weights   03/14/24 1238  Weight: 64 kg    Examination: General: Very pleasant chronically ill  appearing older adult F NAD in bed  HENT: NCAT pink mm anicteric sclera  Lungs: Even and unlabored, symmetrical chest expansion, NWOB Cardiovascular: rrr Abdomen: soft round  Extremities: no acute joint deformity  Neuro: AAOx4   Resolved Hospital Problem list     Assessment & Plan:   Worsening dyspnea and hypoxemia in setting of presumed ILD flare due to  rhinovirus: Increasing glass opacities, procalcitonin negative, pattern not consistent with PJP, improved with steroids in the interim, prior small dense nodular opacity has resolved. Rhinovirus positive on RVP.  -- Solu-Medrol  250 mg IV daily x 3 days (4/23-4/25) then oral prednisone  taper 60 mg for 2 weeks then 40 mg for 2 weeks then 30 mg for 2 weeks then 20 mg for 2 weeks then resume 10 mg daily -- Bactrim  MWF for pjp ppx on higher dose steroids, HA in past -- G6PD WNL so could use other agents if adverse reaction to bactrim  -- Continue breathing treatments, airway clearance etc. -- fungitell in process -- Will need to closely monitor blood sugars and increase insulin  as needed --If DOE not improving recommend cardiology c/s for consideration of RHC to evaluate for development of PH-ILD  Best Practice (right click and "Reselect all SmartList Selections" daily)   Per Primary  Labs   CBC: Recent Labs  Lab 03/14/24 1248 03/15/24 0559 03/16/24 0550  WBC 19.7* 12.3* 16.2*  NEUTROABS 17.8*  --   --   HGB 13.5 12.9 13.0  HCT 44.1 43.0 44.1  MCV 86.5 88.7 88.6  PLT 230 183 201    Basic Metabolic Panel: Recent Labs  Lab 03/14/24 1248 03/15/24 0559 03/16/24 0550  NA 138 140 136  K 4.3 4.3 4.6  CL 105 100 102  CO2 22 31 26   GLUCOSE 129* 114* 199*  BUN 37* 27* 32*  CREATININE 0.82 0.91 0.96  CALCIUM  9.4 8.9 8.9  MG  --   --  2.4   GFR: Estimated Creatinine Clearance: 41.2 mL/min (by C-G formula based on SCr of 0.96 mg/dL). Recent Labs  Lab 03/14/24 1248 03/15/24 0559 03/16/24 0550  PROCALCITON <0.10  --   --   WBC 19.7* 12.3* 16.2*    Liver Function Tests: Recent Labs  Lab 03/14/24 1248  AST 22  ALT 25  ALKPHOS 43  BILITOT 0.5  PROT 6.6  ALBUMIN 3.4*   No results for input(s): "LIPASE", "AMYLASE" in the last 168 hours. No results for input(s): "AMMONIA" in the last 168 hours.  ABG    Component Value Date/Time   PHART 7.410 09/29/2012 0500   PCO2ART  41.1 09/29/2012 0500   PO2ART 58.7 (L) 09/29/2012 0500   HCO3 25.5 (H) 09/29/2012 0500   TCO2 26.8 09/29/2012 0500   ACIDBASEDEF 0.9 09/26/2012 1143   O2SAT 91.3 09/29/2012 0500     Coagulation Profile: No results for input(s): "INR", "PROTIME" in the last 168 hours.  Cardiac Enzymes: No results for input(s): "CKTOTAL", "CKMB", "CKMBINDEX", "TROPONINI" in the last 168 hours.  HbA1C: Hgb A1c MFr Bld  Date/Time Value Ref Range Status  03/15/2024 05:59 AM 7.2 (H) 4.8 - 5.6 % Final    Comment:    (NOTE) Pre diabetes:          5.7%-6.4%  Diabetes:              >6.4%  Glycemic control for   <7.0% adults with diabetes   09/28/2019 12:26 PM 6.2 (H) 4.8 - 5.6 % Final    Comment:    (  NOTE) Pre diabetes:          5.7%-6.4% Diabetes:              >6.4% Glycemic control for   <7.0% adults with diabetes     CBG: Recent Labs  Lab 03/15/24 0708 03/15/24 1138 03/15/24 1559 03/15/24 2110 03/16/24 0724  GLUCAP 98 104* 106* 150* 150*    Review of Systems:   N/a   Past Medical History:  She,  has a past medical history of Allergy, Arthritis, Asthma, Cataract, Coronary artery calcification seen on CAT scan (08/19/2017), DDD (degenerative disc disease), thoracic, Depression, DOE (dyspnea on exertion), Fatty liver, GERD (gastroesophageal reflux disease), H/O steroid therapy, Heart palpitations (02/28/2015), Helicobacter pylori ab+, Hemorrhoids, Hiatal hernia, High cholesterol, History of chronic bronchitis, History of migraine, History of MRSA infection, Hyperlipidemia, mixed (08/19/2017), Hyperplastic colon polyp (2007), IBS (irritable bowel syndrome), Inguinal hernia, Insulin  resistance, Interstitial lung disease (HCC), MVP (mitral valve prolapse), Pneumonia, Pneumonitis, hypersensitivity (HCC), PONV (postoperative nausea and vomiting), Pre-diabetes, Pulmonary fibrosis (HCC), PVC (premature ventricular contraction) (08/19/2017), Rapid heart rate, Squamous cell carcinoma of skin,  Tinnitus, right ear, and Vocal cord ulcer.   Surgical History:   Past Surgical History:  Procedure Laterality Date   35 HOUR PH STUDY N/A 02/21/2018   Procedure: 24 HOUR PH STUDY;  Surgeon: Sergio Dandy, MD;  Location: [facility]COPY;  Service: Endoscopy;  Laterality: N/A;   BREAST BIOPSY Right 2009   BIOPSY, pt denies   BREAST BIOPSY Left 2003   Benign    CATARACT EXTRACTION Left    CESAREAN SECTION     COLONOSCOPY     ESOPHAGEAL MANOMETRY N/A 02/21/2018   Procedure: ESOPHAGEAL MANOMETRY (EM);  Surgeon: Sergio Dandy, MD;  Location: [facility];  Service: Endoscopy;  Laterality: N/A;   FOOT FRACTURE SURGERY  2006 or 2007   right   HYMENECTOMY     LUNG BIOPSY  09/28/2012   Procedure: LUNG BIOPSY;  Surgeon: Zelphia Higashi, MD;  Location: [facility] OR;  Service: Thoracic;  Laterality: N/A;  lung biopsies tims three   SQUAMOUS CELL CARCINOMA EXCISION Left    left arm   TOTAL HIP ARTHROPLASTY Right 09/10/2015   Procedure: RIGHT TOTAL HIP ARTHROPLASTY ANTERIOR APPROACH;  Surgeon: Claiborne Crew, MD;  Location: [facility] ORS;  Service: Orthopedics;  Laterality: Right;   TOTAL HIP ARTHROPLASTY Left 10/03/2019   Procedure: TOTAL HIP ARTHROPLASTY ANTERIOR APPROACH;  Surgeon: Claiborne Crew, MD;  Location: [facility] ORS;  Service: Orthopedics;  Laterality: Left;  70 mins   TUBAL LIGATION     UPPER GI ENDOSCOPY     VIDEO ASSISTED THORACOSCOPY  09/28/2012   Procedure: VIDEO ASSISTED THORACOSCOPY;  Surgeon: Zelphia Higashi, MD;  Location: [facility] OR;  Service: Thoracic;  Laterality: Right;   VIDEO BRONCHOSCOPY  11/19/2011   Procedure: VIDEO BRONCHOSCOPY WITH FLUORO;  Surgeon: Maire Scot, MD;  Location: [facility] ENDOSCOPY;  Service: Endoscopy;;     Social History:   reports that she has never smoked. She has never used smokeless tobacco. She reports current alcohol use of about 2.0 standard drinks of alcohol per week. She reports that she does not use drugs.   Family History:  Her family history  includes Allergic rhinitis in her daughter; Asthma in her maternal grandmother and mother; Bone cancer in her paternal grandfather; Breast cancer in her cousin, cousin, cousin, paternal aunt, and paternal aunt; Dementia (age of onset: 11) in her mother; Diabetes in her father; Emphysema in her maternal grandmother; Heart disease in  her sister; Hypertension in her sister; Kidney disease in her sister; Lung disease in her maternal grandfather; Lymphoma in her father; Osteoarthritis in her mother; Ovarian cancer in her maternal aunt. There is no history of Colon cancer, Angioedema, Eczema, Immunodeficiency, or Urticaria.   Allergies Allergies  Allergen Reactions   Ozempic (0.25 Or 0.5 Mg-Dose) [Semaglutide(0.25 Or 0.5mg -Dos)] Other (See Comments)    "Paralyzed the stomach"   Tape Other (See Comments)    SKIN IS VERY FRAGILE!! "Burns and pulls up" the skin!! SKIN BRUISES and TEARS EASILY.Aaron Aas   Atorvastatin Other (See Comments)    Leg pain   Betadine [Povidone Iodine] Other (See Comments)    Blisters    Codeine Nausea And Vomiting   Garlic Diarrhea   Hydrocodone  Nausea And Vomiting   Macrobid [Nitrofurantoin] Other (See Comments)   Ofev  [Nintedanib] Other (See Comments)    Abdominal pain   Onion Diarrhea   Rosuvastatin Other (See Comments)    Leg pain   Shellfish Allergy Nausea And Vomiting   Statins Other (See Comments)    "Leg pain," but Pravachol  is tolerated   Sulfa  Antibiotics Other (See Comments)    Headaches      Home Medications  Prior to Admission medications   Medication Sig Start Date End Date Taking? Authorizing Provider  acetaminophen  (TYLENOL ) 500 MG tablet Tylenol  Extra Strength 08/24/19   [provider]  albuterol  (PROAIR  HFA) 108 (90 Base) MCG/ACT inhaler Inhale 2 puffs into the lungs every 4 (four) hours as needed for wheezing. Or coughing spells.  You may use 2 Puffs 5-10 minutes before exercise. 04/25/18   Ambs, Jeanmarie Millet, FNP  Alpha-D-Galactosidase (BEANO PO)  Take 1 tablet by mouth as needed (if eating onion or garlic).     [provider]  AMBULATORY NON FORMULARY MEDICATION Allergy injections Once a week    [provider]  Ascorbic Acid (VITAMIN C) 1000 MG tablet Take 1,000 mg by mouth daily.    [provider]  ASHWAGANDHA PO Take 2 capsules by mouth daily.    [provider]  BREO ELLIPTA  100-25 MCG/ACT AEPB Inhale 1 puff into the lungs daily. 03/07/24   Raejean Bullock, NP  budesonide  (PULMICORT ) 0.5 MG/2ML nebulizer solution Take 2 mLs (0.5 mg total) by nebulization in the morning and at bedtime. 02/16/24   Cobb, Mariah Shines, NP  Calcium  Carb-Cholecalciferol (CALCIUM  500 + D PO) Take 2 tablets by mouth daily at 6 (six) AM.    [provider]  chlorpheniramine (CHLOR-TRIMETON) 4 MG tablet Take 2 mg by mouth 2 (two) times daily. Takes half tablet twice daily    [provider]  cholecalciferol (VITAMIN D3) 25 MCG (1000 UNIT) tablet Take 1,000 Units by mouth every other day.    [provider]  ciclopirox (PENLAC) 8 % solution See admin instructions. 02/09/23   [provider]  Continuous Glucose Sensor (FREESTYLE LIBRE 3 SENSOR) MISC check blood sugar fasting and 2 hours after largest meal 01/28/24   [provider]  dicyclomine  (BENTYL ) 10 MG capsule Take 1 capsule by mouth daily as needed. 08/16/18   [provider]  diltiazem  (CARDIZEM  CD) 120 MG 24 hr capsule Take 1 capsule (120 mg total) by mouth daily. 09/28/23   Eilleen Grates, MD  diphenhydrAMINE -PE-APAP (DELSYM COUGH/COLD NIGHT TIME) 12.5-5-325 MG/10ML LIQD Take by mouth as needed.    [provider]  EPINEPHrine  0.3 mg/0.3 mL IJ SOAJ injection Inject into the muscle.    [provider]  ezetimibe  (ZETIA ) 10 MG tablet Take 1 tablet (10 mg total) by mouth daily. 02/10/24   Eilleen Grates, MD  fluticasone  (FLONASE ) 50 MCG/ACT nasal spray Spray 2 sprays into each nostril every day. 03/06/24    Maire Scot, MD  HUMALOG  KWIKPEN 100 UNIT/ML KwikPen Taken 9 unit. 01/27/24   [provider]  ipratropium-albuterol  (DUONEB) 0.5-2.5 (3) MG/3ML SOLN USE ONE VIAL IN NEBULIZER EVERY 6 HOURS AS NEEDED 02/14/24   [provider]  lactase (LACTAID) 3000 units tablet Take by mouth.    [provider]  LANTUS  SOLOSTAR 100 UNIT/ML Solostar Pen Inject 14 Units into the skin daily. 01/03/24   [provider]  levocetirizine (XYZAL ) 5 MG tablet Take 5 mg by mouth at bedtime. 09/26/20   [provider]  levofloxacin  (LEVAQUIN ) 500 MG tablet Take 1 tablet (500 mg total) by mouth daily for 10 days. 03/09/24 03/19/24  Cobb, Mariah Shines, NP  metFORMIN  (GLUCOPHAGE -XR) 500 MG 24 hr tablet Take 500 mg by mouth 2 (two) times daily with a meal. 07/08/23   [provider]  metoprolol  succinate (TOPROL -XL) 25 MG 24 hr tablet Take 1 tablet (25 mg total) by mouth daily. 06/10/23   Eilleen Grates, MD  montelukast  (SINGULAIR ) 10 MG tablet TAKE 1 TABLET BY MOUTH EVERYDAY AT BEDTIME 01/29/20   Maire Scot, MD  Multiple Vitamin (MULTIVITAMIN) tablet Take 1 tablet by mouth daily.    [provider]  mycophenolate  (CELLCEPT ) 500 MG tablet Take 1 tab twice daily x 2 weeks. REPEAT LABS. If stable, increase to 2 tabs twice daily x 2 weeks. REPEAT LABS. If stable, increase to 3 tabs twice daily Patient not taking: Reported on 03/06/2024 02/07/24   Maire Scot, MD  nitroGLYCERIN  (NITROSTAT ) 0.4 MG SL tablet Place 1 tablet (0.4 mg total) under the tongue every 5 (five) minutes as needed for chest pain. 12/06/18 10/06/23  Kroeger, Krista M., PA-C  nystatin -triamcinolone  (MYCOLOG II) cream nystatin -triamcinolone  100,000 unit/g-0.1 % topical cream    [provider]  omeprazole  (PRILOSEC  OTC) 20 MG tablet Take 10 mg by mouth daily.    [provider]  OXYGEN  Inhale into the lungs. Up to 6 liters as needed during the day and nite time use on/off     [provider]  pravastatin  (PRAVACHOL ) 40 MG tablet Take 1 tablet (40 mg total) by mouth every evening. 06/12/20   Eilleen Grates, MD  predniSONE  (DELTASONE ) 10 MG tablet Take 1 tablet (10 mg total) by mouth daily with breakfast. Patient taking differently: Take 20 mg by mouth daily with breakfast. 06/08/23   Maire Scot, MD  predniSONE  (DELTASONE ) 10 MG tablet Take 4 tablets for 3 days then 3 tablets for 3 days then return to 20 mg daily 02/16/24   Cobb, Mariah Shines, NP  Sodium Chloride , Inhalant, 7 % NEBU Use per nebulizer once a day to help clear sputum 07/08/21   Maire Scot, MD  sulfamethoxazole -trimethoprim  (BACTRIM  DS) 800-160 MG tablet Take 1 tablet by mouth 3 (three) times a week. (START after completing two weeks of CELLCEPT  1000mg  twice daily ) Patient not taking: Reported on 03/06/2024 02/07/24   Maire Scot, MD     Critical care time: na       Guerry Leek, MD [facility] Pulmonary/Critical Care Medicine Amion for pager  03/16/2024, 9:15 AM

## 2024-03-16 NOTE — Plan of Care (Signed)
  Problem: Coping: Goal: Ability to adjust to condition or change in health will improve Outcome: Progressing   Problem: Health Behavior/Discharge Planning: Goal: Ability to manage health-related needs will improve Outcome: Progressing   Problem: Nutritional: Goal: Maintenance of adequate nutrition will improve Outcome: Progressing   Problem: Clinical Measurements: Goal: Will remain free from infection Outcome: Progressing

## 2024-03-16 NOTE — Progress Notes (Signed)
 Pt refuses IV at the moment due to it causing pain. Previous IV was removed due to pain. Pt is due to be tapered to oral steroids.

## 2024-03-16 NOTE — Telephone Encounter (Signed)
 Pt was admitted to the hospital , no further on 03/14/24 , no further action needed

## 2024-03-16 NOTE — Progress Notes (Signed)
 PROGRESS NOTE    Rhonda [name]  ZOX:[id number] DOB: 06/01/1951 DOA: 03/14/2024 PCP: Allene Ivan, MD    Brief Narrative:  73 year old with history of interstitial lung disease on chronic prednisone , pulmonary HTN, IBS, steroid-induced DM, palpitations on Cardizem  comes to the hospital acute hypoxic respiratory distress, persistent,, DOE.  She was recently started on CellCept  in efforts to reduce steroids by pulmonary team.  Patient respiratory viral panel was positive for rhinovirus.  Initially started on IV steroid which she started improving on.   Assessment & Plan:  Principal Problem:   Interstitial lung disease (HCC)    Dyspnea on exertion Chronic cough Presume ILD flare Rhinovirus infection -Respiratory panel is positive for rhinovirus.  Possibly inducing this ILD flare -Patient follows outpatient with pulmonary team but due to worsening status for now has been admitted to the hospital.  Plan is to give Solu-Medrol  IV for total of 3 days followed by prednisone  taper starting tomorrow.  - Multiple cultures have been ordered Aggressive bronchodilator treatment. -CT chest has been reviewed showing coarse opacities   Palpitations -continue Cardizem , metoprolol .  IV as needed   Steroid-induced diabetes - A1c - Sliding scale and Accu-Cheks   Hyperlipidemia -Zetia , statin   DVT prophylaxis:Lovenox     Code Status: Full Code Family Communication:   Continue hospital stay for IV steroids, hopefully transition to p.o. tomorrow    Subjective: Feeling better.  Still feels congested as she is coughing.  Examination:  General exam: Appears calm and comfortable  Respiratory system: Some bilateral rhonchi Cardiovascular system: S1 & S2 heard, RRR. No JVD, murmurs, rubs, gallops or clicks. No pedal edema. Gastrointestinal system: Abdomen is nondistended, soft and nontender. No organomegaly or masses felt. Normal bowel sounds heard. Central nervous system: Alert and  oriented. No focal neurological deficits. Extremities: Symmetric 5 x 5 power. Skin: No rashes, lesions or ulcers Psychiatry: Judgement and insight appear normal. Mood & affect appropriate.                Diet Orders (From admission, onward)     Start     Ordered   03/14/24 1628  Diet Carb Modified Fluid consistency: Thin; Room service appropriate? Yes  Diet effective now       Comments: NO GARLIC , NO ONIONS, NO SHELLFISH  Question Answer Comment  Diet-HS Snack? Nothing   Calorie Level Medium 1600-2000   Fluid consistency: Thin   Room service appropriate? Yes      03/14/24 1628            Objective: Vitals:   03/15/24 2020 03/16/24 0421 03/16/24 0848 03/16/24 1005  BP: 127/67 135/67    Pulse: 64 60    Resp: 18 18    Temp: 97.6 F (36.4 C) 97.9 F (36.6 C)    TempSrc:      SpO2: 99% 97% 96% 96%  Weight:      Height:        Intake/Output Summary (Last 24 hours) at 03/16/2024 1047 Last data filed at 03/16/2024 0300 Gross per 24 hour  Intake 294 ml  Output --  Net 294 ml   Filed Weights   03/14/24 1238  Weight: 64 kg    Scheduled Meds:  budesonide  (PULMICORT ) nebulizer solution  0.5 mg Nebulization BID   diltiazem   120 mg Oral Daily   enoxaparin  (LOVENOX ) injection  40 mg Subcutaneous Q24H   ezetimibe   10 mg Oral QHS   fluticasone   1 spray Each Nare Daily   fluticasone  furoate-vilanterol  1 puff Inhalation Daily   insulin  aspart  0-15 Units Subcutaneous TID WC   insulin  aspart  0-5 Units Subcutaneous QHS   insulin  aspart  8 Units Subcutaneous TID WC   insulin  glargine-yfgn  16 Units Subcutaneous Daily   loratadine   10 mg Oral QHS   metoprolol  succinate  25 mg Oral Daily   montelukast   10 mg Oral QHS   pantoprazole   40 mg Oral Q1200   pravastatin   40 mg Oral QHS   [START ON 03/17/2024] sulfamethoxazole -trimethoprim   1 tablet Oral Once per day on Monday Wednesday Friday   Continuous Infusions:  methylPREDNISolone  (SOLU-MEDROL ) injection 250 mg  (03/16/24 1041)    Nutritional status     Body mass index is 28.48 kg/m.  Data Reviewed:   CBC: Recent Labs  Lab 03/14/24 1248 03/15/24 0559 03/16/24 0550  WBC 19.7* 12.3* 16.2*  NEUTROABS 17.8*  --   --   HGB 13.5 12.9 13.0  HCT 44.1 43.0 44.1  MCV 86.5 88.7 88.6  PLT 230 183 201   Basic Metabolic Panel: Recent Labs  Lab 03/14/24 1248 03/15/24 0559 03/16/24 0550  NA 138 140 136  K 4.3 4.3 4.6  CL 105 100 102  CO2 22 31 26   GLUCOSE 129* 114* 199*  BUN 37* 27* 32*  CREATININE 0.82 0.91 0.96  CALCIUM  9.4 8.9 8.9  MG  --   --  2.4   GFR: Estimated Creatinine Clearance: 41.2 mL/min (by C-G formula based on SCr of 0.96 mg/dL). Liver Function Tests: Recent Labs  Lab 03/14/24 1248  AST 22  ALT 25  ALKPHOS 43  BILITOT 0.5  PROT 6.6  ALBUMIN 3.4*   No results for input(s): "LIPASE", "AMYLASE" in the last 168 hours. No results for input(s): "AMMONIA" in the last 168 hours. Coagulation Profile: No results for input(s): "INR", "PROTIME" in the last 168 hours. Cardiac Enzymes: No results for input(s): "CKTOTAL", "CKMB", "CKMBINDEX", "TROPONINI" in the last 168 hours. BNP (last 3 results) Recent Labs    03/18/23 1402 03/06/24 0934  PROBNP 71.0 70.0   HbA1C: Recent Labs    03/15/24 0559  HGBA1C 7.2*   CBG: Recent Labs  Lab 03/15/24 0708 03/15/24 1138 03/15/24 1559 03/15/24 2110 03/16/24 0724  GLUCAP 98 104* 106* 150* 150*   Lipid Profile: No results for input(s): "CHOL", "HDL", "LDLCALC", "TRIG", "CHOLHDL", "LDLDIRECT" in the last 72 hours. Thyroid  Function Tests: No results for input(s): "TSH", "T4TOTAL", "FREET4", "T3FREE", "THYROIDAB" in the last 72 hours. Anemia Panel: No results for input(s): "VITAMINB12", "FOLATE", "FERRITIN", "TIBC", "IRON", "RETICCTPCT" in the last 72 hours. Sepsis Labs: Recent Labs  Lab 03/14/24 1248  PROCALCITON <0.10    Recent Results (from the past 240 hours)  Resp panel by RT-PCR (RSV, Flu A&B, Covid)  Anterior Nasal Swab     Status: None   Collection Time: 03/14/24 12:29 PM   Specimen: Anterior Nasal Swab  Result Value Ref Range Status   SARS Coronavirus 2 by RT PCR NEGATIVE NEGATIVE Final    Comment: (NOTE) SARS-CoV-2 target nucleic acids are NOT DETECTED.  The SARS-CoV-2 RNA is generally detectable in upper respiratory specimens during the acute phase of infection. The lowest concentration of SARS-CoV-2 viral copies this assay can detect is 138 copies/mL. A negative result does not preclude SARS-Cov-2 infection and should not be used as the sole basis for treatment or other patient management decisions. A negative result may occur with  improper specimen collection/handling, submission of specimen other than nasopharyngeal swab, presence of  viral mutation(s) within the areas targeted by this assay, and inadequate number of viral copies(<138 copies/mL). A negative result must be combined with clinical observations, patient history, and epidemiological information. The expected result is Negative.  Fact Sheet for Patients:  BloggerCourse.com  Fact Sheet for Healthcare Providers:  SeriousBroker.it  This test is no t yet approved or cleared by the [place]  FDA and  has been authorized for detection and/or diagnosis of SARS-CoV-2 by FDA under an Emergency Use Authorization (EUA). This EUA will remain  in effect (meaning this test can be used) for the duration of the COVID-19 declaration under Section 564(b)(1) of the Act, 21 U.S.C.section 360bbb-3(b)(1), unless the authorization is terminated  or revoked sooner.       Influenza A by PCR NEGATIVE NEGATIVE Final   Influenza B by PCR NEGATIVE NEGATIVE Final    Comment: (NOTE) The Xpert Xpress SARS-CoV-2/FLU/RSV plus assay is intended as an aid in the diagnosis of influenza from Nasopharyngeal swab specimens and should not be used as a sole basis for treatment. Nasal washings  and aspirates are unacceptable for Xpert Xpress SARS-CoV-2/FLU/RSV testing.  Fact Sheet for Patients: BloggerCourse.com  Fact Sheet for Healthcare Providers: SeriousBroker.it  This test is not yet approved or cleared by the [place]  FDA and has been authorized for detection and/or diagnosis of SARS-CoV-2 by FDA under an Emergency Use Authorization (EUA). This EUA will remain in effect (meaning this test can be used) for the duration of the COVID-19 declaration under Section 564(b)(1) of the Act, 21 U.S.C. section 360bbb-3(b)(1), unless the authorization is terminated or revoked.     Resp Syncytial Virus by PCR NEGATIVE NEGATIVE Final    Comment: (NOTE) Fact Sheet for Patients: BloggerCourse.com  Fact Sheet for Healthcare Providers: SeriousBroker.it  This test is not yet approved or cleared by the [place]  FDA and has been authorized for detection and/or diagnosis of SARS-CoV-2 by FDA under an Emergency Use Authorization (EUA). This EUA will remain in effect (meaning this test can be used) for the duration of the COVID-19 declaration under Section 564(b)(1) of the Act, 21 U.S.C. section 360bbb-3(b)(1), unless the authorization is terminated or revoked.  Performed at [facility], [address], [place], [place] [address]   Respiratory (~20 pathogens) panel by PCR     Status: Abnormal   Collection Time: 03/14/24 12:29 PM   Specimen: Nasopharyngeal Swab; Respiratory  Result Value Ref Range Status   Adenovirus NOT DETECTED NOT DETECTED Final   Coronavirus 229E NOT DETECTED NOT DETECTED Final    Comment: (NOTE) The Coronavirus on the Respiratory Panel, DOES NOT test for the novel  Coronavirus (2019 nCoV)    Coronavirus HKU1 NOT DETECTED NOT DETECTED Final   Coronavirus NL63 NOT DETECTED NOT DETECTED Final   Coronavirus OC43 NOT DETECTED NOT  DETECTED Final   Metapneumovirus NOT DETECTED NOT DETECTED Final   Rhinovirus / Enterovirus DETECTED (A) NOT DETECTED Final   Influenza A NOT DETECTED NOT DETECTED Final   Influenza B NOT DETECTED NOT DETECTED Final   Parainfluenza Virus 1 NOT DETECTED NOT DETECTED Final   Parainfluenza Virus 2 NOT DETECTED NOT DETECTED Final   Parainfluenza Virus 3 NOT DETECTED NOT DETECTED Final   Parainfluenza Virus 4 NOT DETECTED NOT DETECTED Final   Respiratory Syncytial Virus NOT DETECTED NOT DETECTED Final   Bordetella pertussis NOT DETECTED NOT DETECTED Final   Bordetella Parapertussis NOT DETECTED NOT DETECTED Final   Chlamydophila pneumoniae NOT DETECTED NOT DETECTED Final   Mycoplasma  pneumoniae NOT DETECTED NOT DETECTED Final    Comment: Performed at [facility], [address] [place], [place] [address]         Radiology Studies: CT CHEST WO CONTRAST Result Date: 03/14/2024 CLINICAL DATA:  Pulmonary fibrosis. Lung nodule. Shortness of breath. EXAM: CT CHEST WITHOUT CONTRAST TECHNIQUE: Multidetector CT imaging of the chest was performed following the standard protocol without IV contrast. RADIATION DOSE REDUCTION: This exam was performed according to the departmental dose-optimization program which includes automated exposure control, adjustment of the mA and/or kV according to patient size and/or use of iterative reconstruction technique. COMPARISON:  Chest CT dated 03/08/2024. FINDINGS: Evaluation of this exam is limited in the absence of intravenous contrast. Cardiovascular: There is no cardiomegaly or pericardial effusion. Three-vessel coronary vascular calcification. Mild atherosclerotic calcification of the thoracic aorta. No aneurysmal dilatation. The central pulmonary arteries are grossly unremarkable. Mediastinum/Nodes: No hilar or mediastinal adenopathy. The esophagus is grossly unremarkable no mediastinal fluid collection. Lungs/Pleura: Similar appearance of diffuse interstitial  coarsening, reticulation, and bronchiectasis in keeping with interstitial lung disease. No focal consolidation, pleural effusion, or pneumothorax. The central airways are patent. Upper Abdomen: No acute abnormality. Musculoskeletal: Osteopenia with degenerative changes. No acute osseous pathology. IMPRESSION: 1. Similar appearance of interstitial lung disease. No focal consolidation. 2. Three-vessel coronary vascular calcification. 3.  Aortic Atherosclerosis (ICD10-I70.0). Electronically Signed   By: Angus Bark M.D.   On: 03/14/2024 16:15   DG Chest 2 View Result Date: 03/14/2024 CLINICAL DATA:  Cough and dyspnea EXAM: CHEST - 2 VIEW COMPARISON:  March 06, 2024 FINDINGS: Chronic interstitial lung disease. Without evidence of acute infiltrates or consolidations. Overall no significant change since prior examination No pleural effusions.  Heart and mediastinum normal IMPRESSION: Chronic interstitial lung disease. Electronically Signed   By: Fredrich Jefferson M.D.   On: 03/14/2024 14:50           LOS: 2 days   Time spent= 35 mins    Maggie Schooner, MD [facility]  If 7PM-7AM, please contact night-coverage  03/16/2024, 10:47 AM

## 2024-03-16 NOTE — Progress Notes (Signed)
 SATURATION QUALIFICATIONS: (This note is used to comply with regulatory documentation for home oxygen )  Patient Saturations on Room Air at Rest = 90%  Patient Saturations on Room Air while Ambulating = 87%  Patient Saturations on 2 Liters of oxygen  while Ambulating = 95%  Please briefly explain why patient needs home oxygen : pt gets SOB after walking 60 feet in the hallway

## 2024-03-16 NOTE — Plan of Care (Signed)

## 2024-03-17 DIAGNOSIS — J849 Interstitial pulmonary disease, unspecified: Secondary | ICD-10-CM | POA: Diagnosis not present

## 2024-03-17 LAB — CBC
HCT: 46.5 % — ABNORMAL HIGH (ref 36.0–46.0)
Hemoglobin: 13.8 g/dL (ref 12.0–15.0)
MCH: 26.6 pg (ref 26.0–34.0)
MCHC: 29.7 g/dL — ABNORMAL LOW (ref 30.0–36.0)
MCV: 89.6 fL (ref 80.0–100.0)
Platelets: 223 10*3/uL (ref 150–400)
RBC: 5.19 MIL/uL — ABNORMAL HIGH (ref 3.87–5.11)
RDW: 16 % — ABNORMAL HIGH (ref 11.5–15.5)
WBC: 17.1 10*3/uL — ABNORMAL HIGH (ref 4.0–10.5)
nRBC: 0 % (ref 0.0–0.2)

## 2024-03-17 LAB — BASIC METABOLIC PANEL WITH GFR
Anion gap: 8 (ref 5–15)
BUN: 32 mg/dL — ABNORMAL HIGH (ref 8–23)
CO2: 30 mmol/L (ref 22–32)
Calcium: 9 mg/dL (ref 8.9–10.3)
Chloride: 98 mmol/L (ref 98–111)
Creatinine, Ser: 0.91 mg/dL (ref 0.44–1.00)
GFR, Estimated: >60 mL/min (ref >60)
Glucose, Bld: 172 mg/dL — ABNORMAL HIGH (ref 70–99)
Potassium: 4.2 mmol/L (ref 3.5–5.1)
Sodium: 136 mmol/L (ref 135–145)

## 2024-03-17 LAB — GLUCOSE, CAPILLARY: Glucose-Capillary: 175 mg/dL — ABNORMAL HIGH (ref 70–99)

## 2024-03-17 LAB — MAGNESIUM: Magnesium: 2.7 mg/dL — ABNORMAL HIGH (ref 1.7–2.4)

## 2024-03-17 MED ORDER — FLUTICASONE FUROATE-VILANTEROL 100-25 MCG/ACT IN AEPB: 1.0000 | INHALATION_SPRAY | Freq: Every day | RESPIRATORY_TRACT | 0 refills | Status: DC

## 2024-03-17 MED ORDER — PREDNISONE 10 MG PO TABS: 10.0000 mg | ORAL_TABLET | Freq: Every day | ORAL | Status: DC

## 2024-03-17 MED ORDER — BUDESONIDE 0.5 MG/2ML IN SUSP: 0.5000 mg | Freq: Two times a day (BID) | RESPIRATORY_TRACT | 0 refills | Status: DC

## 2024-03-17 MED ORDER — PREDNISONE 10 MG PO TABS
ORAL_TABLET | ORAL | 0 refills | Status: DC
Start: 2024-03-18 — End: 2024-05-30

## 2024-03-17 MED ORDER — PREDNISONE 20 MG PO TABS: 30.0000 mg | ORAL_TABLET | Freq: Every day | ORAL | Status: DC

## 2024-03-17 MED ORDER — PREDNISONE 20 MG PO TABS: 20.0000 mg | ORAL_TABLET | Freq: Every day | ORAL | Status: DC

## 2024-03-17 MED ORDER — SULFAMETHOXAZOLE-TRIMETHOPRIM 800-160 MG PO TABS: 1.0000 | ORAL_TABLET | ORAL | 0 refills | Status: DC

## 2024-03-17 MED ORDER — PREDNISONE 50 MG PO TABS
60.0000 mg | ORAL_TABLET | Freq: Every day | ORAL | Status: DC
  Administered 2024-03-17: 60 mg via ORAL
  Filled 2024-03-17: qty 1

## 2024-03-17 MED ORDER — PREDNISONE 20 MG PO TABS: 40.0000 mg | ORAL_TABLET | Freq: Every day | ORAL | Status: DC

## 2024-03-17 MED ORDER — ALBUTEROL SULFATE HFA 108 (90 BASE) MCG/ACT IN AERS: 2.0000 | INHALATION_SPRAY | RESPIRATORY_TRACT | 3 refills | Status: DC | PRN

## 2024-03-17 MED ORDER — IPRATROPIUM-ALBUTEROL 0.5-2.5 (3) MG/3ML IN SOLN: 3.0000 mL | Freq: Four times a day (QID) | RESPIRATORY_TRACT | 0 refills | Status: DC | PRN

## 2024-03-17 NOTE — Plan of Care (Signed)

## 2024-03-17 NOTE — Discharge Summary (Signed)
 Physician Discharge Summary  Rhonda [name] WNU:[id number] DOB: Dec 10, 1950 DOA: 03/14/2024  PCP: Allene Ivan, MD  Admit date: 03/14/2024 Discharge date: 03/17/2024  Admitted From: Home Disposition: Home  Recommendations for Outpatient Follow-up:  Follow up with PCP in 1-2 weeks Please obtain BMP/CBC in one week your next doctors visit.  Prolonged course of prednisone  taper given as mentioned below Bronchodilators prescribed Back on prophylaxis Monday Wednesday Friday Outpatient pulmonary follow-up   Discharge Condition: Stable CODE STATUS: Full code Diet recommendation: diabetic  Brief/Interim Summary: Brief Narrative:  73 year old with history of interstitial lung disease on chronic prednisone , pulmonary HTN, IBS, steroid-induced DM, palpitations on Cardizem  comes to the hospital acute hypoxic respiratory distress, persistent,, DOE.  She was recently started on CellCept  in efforts to reduce steroids by pulmonary team.  Patient respiratory viral panel was positive for rhinovirus.  Initially started on IV steroid which she started improving on.  She has been physician for prolonged course of prednisone  taper along with Bactrim  for prophylaxis. Medically stable for discharge today.   Assessment & Plan:  Principal Problem:   Interstitial lung disease (HCC)    Dyspnea on exertion Chronic cough Presume ILD flare Rhinovirus infection -Respiratory panel is positive for rhinovirus.  Possibly inducing this ILD flare - Will finish IV steroids today thereafter transition prednisone  60 mg for 2 weeks > 40 mg 2 weeks > 30 mg 2 weeks > 20 mg 2 weeks > 10 mg daily thereafter -Bactrim  MWF for prophylaxis - Multiple cultures have been ordered Aggressive bronchodilator treatment. -CT chest has been reviewed showing coarse opacities   Palpitations -continue Cardizem , metoprolol .  IV as needed   Steroid-induced diabetes - A1c-7.2 - Sliding scale and Accu-Cheks.  Continue Semglee     Hyperlipidemia -Zetia , statin   DVT prophylaxis:Lovenox     Code Status: Full Code Family Communication:   Hopefully discharge today   Subjective: Doing much better.  Wishing to go home.  Examination:  General exam: Appears calm and comfortable  Respiratory system: Clear to auscultation bilaterally Cardiovascular system: S1 & S2 heard, RRR. No JVD, murmurs, rubs, gallops or clicks. No pedal edema. Gastrointestinal system: Abdomen is nondistended, soft and nontender. No organomegaly or masses felt. Normal bowel sounds heard. Central nervous system: Alert and oriented. No focal neurological deficits. Extremities: Symmetric 5 x 5 power. Skin: No rashes, lesions or ulcers Psychiatry: Judgement and insight appear normal. Mood & affect appropriate.    Discharge Diagnoses:  Principal Problem:   Interstitial lung disease (HCC)      Discharge Exam: Vitals:   03/17/24 0811 03/17/24 0814  BP:    Pulse:    Resp:    Temp:    SpO2: 96% 96%   Vitals:   03/16/24 2046 03/17/24 0438 03/17/24 0811 03/17/24 0814  BP:  (!) 141/69    Pulse:  62    Resp:  16    Temp:  98.2 F (36.8 C)    TempSrc:      SpO2: 93% 92% 96% 96%  Weight:      Height:          Discharge Instructions   Allergies as of 03/17/2024       Reactions   Ozempic (0.25 Or 0.5 Mg-dose) [semaglutide(0.25 Or 0.5mg -dos)] Other (See Comments)   "Paralyzed the stomach"   Tape Other (See Comments)   SKIN IS VERY FRAGILE!! "Burns and pulls up" the skin!! SKIN BRUISES and TEARS EASILY.Aaron Aas   Atorvastatin Other (See Comments)   Leg pain   Betadine [povidone  Iodine] Other (See Comments)   Blisters   Codeine Nausea And Vomiting   Garlic Diarrhea   Hydrocodone  Nausea And Vomiting   Macrobid [nitrofurantoin] Other (See Comments)   Ofev  [nintedanib] Other (See Comments)   Abdominal pain   Onion Diarrhea   Rosuvastatin Other (See Comments)   Leg pain   Shellfish Allergy Nausea And Vomiting   Statins Other (See  Comments)   "Leg pain," but Pravachol  is tolerated   Sulfa  Antibiotics Other (See Comments)   Headaches        Medication List     STOP taking these medications    levofloxacin  500 MG tablet Commonly known as: LEVAQUIN        TAKE these medications    acetaminophen  500 MG tablet Commonly known as: TYLENOL  Take 500 mg by mouth every 6 (six) hours as needed for mild pain (pain score 1-3) or headache.   albuterol  108 (90 Base) MCG/ACT inhaler Commonly known as: ProAir  HFA Inhale 2 puffs into the lungs every 4 (four) hours as needed for wheezing. Or coughing spells.  You may use 2 Puffs 5-10 minutes before exercise. What changed:  reasons to take this additional instructions   AMBULATORY NON FORMULARY MEDICATION See admin instructions. Allergy injections on Mondays and hold when sick   ascorbic acid 500 MG tablet Commonly known as: VITAMIN C Take 500 mg by mouth daily.   ASHWAGANDHA PO Take 1 capsule by mouth every other day.   BEANO PO Take 1 tablet by mouth as needed (as directed, if eating onion or garlic).   budesonide  0.5 MG/2ML nebulizer solution Commonly known as: Pulmicort  Take 2 mLs (0.5 mg total) by nebulization in the morning and at bedtime.   CALCIUM  500 + D PO Take 2 tablets by mouth See admin instructions. Chew 2 gummies by mouth once a day   chlorpheniramine 4 MG tablet Commonly known as: CHLOR-TRIMETON Take 2 mg by mouth 2 (two) times daily as needed for allergies or rhinitis.   cholecalciferol 25 MCG (1000 UNIT) tablet Commonly known as: VITAMIN D3 Take 1,000 Units by mouth every other day.   Delsym Cough/Cold Night Time 12.5-5-325 MG/10ML Liqd Generic drug: diphenhydrAMINE -PE-APAP Take 10 mLs by mouth every 12 (twelve) hours as needed (for coughing).   Dexcom G7 Sensor Misc Inject 1 Device into the skin See admin instructions. Place one new sensor into the skin every 10 days   dicyclomine  10 MG capsule Commonly known as: BENTYL  Take 10  mg by mouth daily as needed for spasms.   diltiazem  120 MG 24 hr capsule Commonly known as: CARDIZEM  CD Take 1 capsule (120 mg total) by mouth daily.   EPINEPHrine  0.3 mg/0.3 mL Soaj injection Commonly known as: EPI-PEN Inject 0.3 mg into the muscle as needed for anaphylaxis.   ezetimibe  10 MG tablet Commonly known as: ZETIA  Take 1 tablet (10 mg total) by mouth daily. What changed: when to take this   fluticasone  50 MCG/ACT nasal spray Commonly known as: FLONASE  Spray 2 sprays into each nostril every day. What changed: See the new instructions.   fluticasone  furoate-vilanterol 100-25 MCG/ACT Aepb Commonly known as: Breo Ellipta  Inhale 1 puff into the lungs daily.   HumaLOG  KwikPen 100 UNIT/ML KwikPen Generic drug: insulin  lispro Inject 8 Units into the skin 3 (three) times daily before meals. Taken 9 unit.   ipratropium-albuterol  0.5-2.5 (3) MG/3ML Soln Commonly known as: DUONEB Take 3 mLs by nebulization every 6 (six) hours as needed (for shortness of breath or wheezing  or when sick).   Lantus  SoloStar 100 UNIT/ML Solostar Pen Generic drug: insulin  glargine Inject 16 Units into the skin daily before breakfast.   levocetirizine 5 MG tablet Commonly known as: XYZAL  Take 5 mg by mouth at bedtime.   metFORMIN  500 MG 24 hr tablet Commonly known as: GLUCOPHAGE -XR Take 500 mg by mouth daily with breakfast.   metoprolol  succinate 25 MG 24 hr tablet Commonly known as: TOPROL -XL Take 1 tablet (25 mg total) by mouth daily.   montelukast  10 MG tablet Commonly known as: SINGULAIR  TAKE 1 TABLET BY MOUTH EVERYDAY AT BEDTIME What changed: See the new instructions.   multivitamin tablet Take 1 tablet by mouth daily with breakfast.   mycophenolate  500 MG tablet Commonly known as: CellCept  Take 1 tab twice daily x 2 weeks. REPEAT LABS. If stable, increase to 2 tabs twice daily x 2 weeks. REPEAT LABS. If stable, increase to 3 tabs twice daily   NATURES TEARS OP Place 1 drop  into both eyes in the morning and at bedtime.   nitroGLYCERIN  0.4 MG SL tablet Commonly known as: NITROSTAT  Place 1 tablet (0.4 mg total) under the tongue every 5 (five) minutes as needed for chest pain.   OXYGEN  Inhale 4-5 L/min into the lungs See admin instructions. Inhale 4-5 L/min at bedtime and as needed for shortness of breath during the day   pravastatin  40 MG tablet Commonly known as: PRAVACHOL  Take 1 tablet (40 mg total) by mouth every evening. What changed: when to take this   predniSONE  10 MG tablet Commonly known as: DELTASONE  Take 6 tablets (60 mg total) by mouth daily with breakfast for 14 days, THEN 4 tablets (40 mg total) daily with breakfast for 14 days, THEN 3 tablets (30 mg total) daily with breakfast for 14 days, THEN 2 tablets (20 mg total) daily with breakfast for 14 days, THEN 1 tablet (10 mg total) daily with breakfast. Start taking on: March 18, 2024 What changed:  See the new instructions. Another medication with the same name was removed. Continue taking this medication, and follow the directions you see here.   PriLOSEC  OTC 20 MG tablet Generic drug: omeprazole  Take 10 mg by mouth daily before breakfast.   Sodium Chloride  (Inhalant) 7 % Nebu Use per nebulizer once a day to help clear sputum   sodium chloride  0.65 % Soln nasal spray Commonly known as: OCEAN Place 1 spray into both nostrils as needed for congestion.   sulfamethoxazole -trimethoprim  800-160 MG tablet Commonly known as: BACTRIM  DS Take 1 tablet by mouth every Monday, Wednesday, and Friday. (START after completing two weeks of CELLCEPT  1000mg  twice daily ) What changed: when to take this        Follow-up Information     Allene Ivan, MD Follow up in 1 week(s).   Specialty: Family Medicine Contact information: [address] [place] [address] [phone]                Allergies  Allergen Reactions   Ozempic (0.25 Or 0.5 Mg-Dose) [Semaglutide(0.25  Or 0.5mg -Dos)] Other (See Comments)    "Paralyzed the stomach"   Tape Other (See Comments)    SKIN IS VERY FRAGILE!! "Burns and pulls up" the skin!! SKIN BRUISES and TEARS EASILY.Aaron Aas   Atorvastatin Other (See Comments)    Leg pain   Betadine [Povidone Iodine] Other (See Comments)    Blisters    Codeine Nausea And Vomiting   Garlic Diarrhea   Hydrocodone  Nausea And Vomiting  Macrobid [Nitrofurantoin] Other (See Comments)   Ofev  [Nintedanib] Other (See Comments)    Abdominal pain   Onion Diarrhea   Rosuvastatin Other (See Comments)    Leg pain   Shellfish Allergy Nausea And Vomiting   Statins Other (See Comments)    "Leg pain," but Pravachol  is tolerated   Sulfa  Antibiotics Other (See Comments)    Headaches     You were cared for by a hospitalist during your hospital stay. If you have any questions about your discharge medications or the care you received while you were in the hospital after you are discharged, you can call the unit and asked to speak with the hospitalist on call if the hospitalist that took care of you is not available. Once you are discharged, your primary care physician will handle any further medical issues. Please note that no refills for any discharge medications will be authorized once you are discharged, as it is imperative that you return to your primary care physician (or establish a relationship with a primary care physician if you do not have one) for your aftercare needs so that they can reassess your need for medications and monitor your lab values.  You were cared for by a hospitalist during your hospital stay. If you have any questions about your discharge medications or the care you received while you were in the hospital after you are discharged, you can call the unit and asked to speak with the hospitalist on call if the hospitalist that took care of you is not available. Once you are discharged, your primary care physician will handle any further medical  issues. Please note that NO REFILLS for any discharge medications will be authorized once you are discharged, as it is imperative that you return to your primary care physician (or establish a relationship with a primary care physician if you do not have one) for your aftercare needs so that they can reassess your need for medications and monitor your lab values.  Please request your Prim.MD to go over all Hospital Tests and Procedure/Radiological results at the follow up, please get all Hospital records sent to your Prim MD by signing hospital release before you go home.  Get CBC, CMP, 2 view Chest X ray checked  by Primary MD during your next visit or SNF MD in 5-7 days ( we routinely change or add medications that can affect your baseline labs and fluid status, therefore we recommend that you get the mentioned basic workup next visit with your PCP, your PCP may decide not to get them or add new tests based on their clinical decision)  On your next visit with your primary care physician please Get Medicines reviewed and adjusted.  If you experience worsening of your admission symptoms, develop shortness of breath, life threatening emergency, suicidal or homicidal thoughts you must seek medical attention immediately by calling 911 or calling your MD immediately  if symptoms less severe.  You Must read complete instructions/literature along with all the possible adverse reactions/side effects for all the Medicines you take and that have been prescribed to you. Take any new Medicines after you have completely understood and accpet all the possible adverse reactions/side effects.   Do not drive, operate heavy machinery, perform activities at heights, swimming or participation in water  activities or provide baby sitting services if your were admitted for syncope or siezures until you have seen by Primary MD or a Neurologist and advised to do so again.  Do not drive when  taking Pain  medications.   Procedures/Studies: CT CHEST WO CONTRAST Result Date: 03/14/2024 CLINICAL DATA:  Pulmonary fibrosis. Lung nodule. Shortness of breath. EXAM: CT CHEST WITHOUT CONTRAST TECHNIQUE: Multidetector CT imaging of the chest was performed following the standard protocol without IV contrast. RADIATION DOSE REDUCTION: This exam was performed according to the departmental dose-optimization program which includes automated exposure control, adjustment of the mA and/or kV according to patient size and/or use of iterative reconstruction technique. COMPARISON:  Chest CT dated 03/08/2024. FINDINGS: Evaluation of this exam is limited in the absence of intravenous contrast. Cardiovascular: There is no cardiomegaly or pericardial effusion. Three-vessel coronary vascular calcification. Mild atherosclerotic calcification of the thoracic aorta. No aneurysmal dilatation. The central pulmonary arteries are grossly unremarkable. Mediastinum/Nodes: No hilar or mediastinal adenopathy. The esophagus is grossly unremarkable no mediastinal fluid collection. Lungs/Pleura: Similar appearance of diffuse interstitial coarsening, reticulation, and bronchiectasis in keeping with interstitial lung disease. No focal consolidation, pleural effusion, or pneumothorax. The central airways are patent. Upper Abdomen: No acute abnormality. Musculoskeletal: Osteopenia with degenerative changes. No acute osseous pathology. IMPRESSION: 1. Similar appearance of interstitial lung disease. No focal consolidation. 2. Three-vessel coronary vascular calcification. 3.  Aortic Atherosclerosis (ICD10-I70.0). Electronically Signed   By: Angus Bark M.D.   On: 03/14/2024 16:15   DG Chest 2 View Result Date: 03/14/2024 CLINICAL DATA:  Cough and dyspnea EXAM: CHEST - 2 VIEW COMPARISON:  March 06, 2024 FINDINGS: Chronic interstitial lung disease. Without evidence of acute infiltrates or consolidations. Overall no significant change since prior  examination No pleural effusions.  Heart and mediastinum normal IMPRESSION: Chronic interstitial lung disease. Electronically Signed   By: Fredrich Jefferson M.D.   On: 03/14/2024 14:50   CT CHEST WO CONTRAST Result Date: 03/08/2024 CLINICAL DATA:  Chronic dyspnea.  Community-acquired pneumonia. EXAM: CT CHEST WITHOUT CONTRAST TECHNIQUE: Multidetector CT imaging of the chest was performed following the standard protocol without IV contrast. RADIATION DOSE REDUCTION: This exam was performed according to the departmental dose-optimization program which includes automated exposure control, adjustment of the mA and/or kV according to patient size and/or use of iterative reconstruction technique. COMPARISON:  01/27/2023 FINDINGS: Cardiovascular: The heart size is upper normal to borderline enlarged. Coronary artery calcification is evident. Mild atherosclerotic calcification is noted in the wall of the thoracic aorta. Mediastinum/Nodes: No mediastinal lymphadenopathy. No evidence for gross hilar lymphadenopathy although assessment is limited by the lack of intravenous contrast on the current study. Small hiatal hernia. The esophagus has normal imaging features. There is no axillary lymphadenopathy. Lungs/Pleura: Similar appearance of chronic interstitial coarsening with scattered areas of subpleural reticulation and honeycombing. Patchy areas of ground-glass opacity are noted bilaterally. Chronic changes become more pronounced at the lung bases. Evidence of prior surgery in the anterior right lung base. There is a new nodular consolidative opacity in the peripheral left base measuring 18 x 10 mm on image 76/4. Upper Abdomen: Visualized portion of the upper abdomen shows no acute findings. Musculoskeletal: No worrisome lytic or sclerotic osseous abnormality. IMPRESSION: 1. Similar appearance of chronic interstitial coarsening with scattered areas of subpleural reticulation and honeycombing. Patchy areas of ground-glass  opacity are noted bilaterally. Imaging features are compatible with interstitial lung disease considered suggestive of non UIP etiology with chronic fibrotic face hypersensitivity pneumonitis favored. 2. New nodular consolidative opacity in the peripheral left base measuring 18 x 10 mm. Imaging features are compatible with an infectious/inflammatory process. Follow-up CT chest in 3 months recommended to ensure resolution. 3. Small hiatal hernia. 4.  Aortic  Atherosclerois (ICD10-170.0) Electronically Signed   By: Donnal Fusi M.D.   On: 03/08/2024 11:32   DG Chest 2 View Result Date: 03/06/2024 CLINICAL DATA:  Unresolved cough. EXAM: CHEST - 2 VIEW COMPARISON:  02/16/2024 FINDINGS: Similar appearance underlying chronic interstitial lung disease. The ill-defined airspace opacity in the left lung described previously is stable in the interval. No pleural effusion. No pneumothorax. The cardiopericardial silhouette is within normal limits for size. No acute bony abnormality. IMPRESSION: 1. Similar appearance of underlying chronic interstitial lung disease. 2. Stable ill-defined airspace opacity in the left lung. Findings may reflect component of the pulmonary fibrosis although persistent infection could have this appearance. Continued follow-up warranted. Electronically Signed   By: Donnal Fusi M.D.   On: 03/06/2024 10:39   DG Chest 2 View Result Date: 02/16/2024 CLINICAL DATA:  Acute bronchitis EXAM: CHEST - 2 VIEW COMPARISON:  03/18/2023, 01/20/2023, chest CT 01/27/2023 FINDINGS: Chronic interstitial lung disease with fibrosis and bronchiectasis. Slight increased ground-glass opacity in the left mid to lower lung compared to prior radiograph. No pleural effusion. Normal cardiac size. No pneumothorax IMPRESSION: Chronic interstitial lung disease with fibrosis and bronchiectasis. Slight increased ground-glass opacity in the left mid to lower lung compared to prior radiograph, possible superimposed pneumonia.  Electronically Signed   By: Esmeralda Hedge M.D.   On: 02/16/2024 17:53     The results of significant diagnostics from this hospitalization (including imaging, microbiology, ancillary and laboratory) are listed below for reference.     Microbiology: Recent Results (from the past 240 hours)  Resp panel by RT-PCR (RSV, Flu A&B, Covid) Anterior Nasal Swab     Status: None   Collection Time: 03/14/24 12:29 PM   Specimen: Anterior Nasal Swab  Result Value Ref Range Status   SARS Coronavirus 2 by RT PCR NEGATIVE NEGATIVE Final    Comment: (NOTE) SARS-CoV-2 target nucleic acids are NOT DETECTED.  The SARS-CoV-2 RNA is generally detectable in upper respiratory specimens during the acute phase of infection. The lowest concentration of SARS-CoV-2 viral copies this assay can detect is 138 copies/mL. A negative result does not preclude SARS-Cov-2 infection and should not be used as the sole basis for treatment or other patient management decisions. A negative result may occur with  improper specimen collection/handling, submission of specimen other than nasopharyngeal swab, presence of viral mutation(s) within the areas targeted by this assay, and inadequate number of viral copies(<138 copies/mL). A negative result must be combined with clinical observations, patient history, and epidemiological information. The expected result is Negative.  Fact Sheet for Patients:  BloggerCourse.com  Fact Sheet for Healthcare Providers:  SeriousBroker.it  This test is no t yet approved or cleared by the [place]  FDA and  has been authorized for detection and/or diagnosis of SARS-CoV-2 by FDA under an Emergency Use Authorization (EUA). This EUA will remain  in effect (meaning this test can be used) for the duration of the COVID-19 declaration under Section 564(b)(1) of the Act, 21 U.S.C.section 360bbb-3(b)(1), unless the authorization is terminated   or revoked sooner.       Influenza A by PCR NEGATIVE NEGATIVE Final   Influenza B by PCR NEGATIVE NEGATIVE Final    Comment: (NOTE) The Xpert Xpress SARS-CoV-2/FLU/RSV plus assay is intended as an aid in the diagnosis of influenza from Nasopharyngeal swab specimens and should not be used as a sole basis for treatment. Nasal washings and aspirates are unacceptable for Xpert Xpress SARS-CoV-2/FLU/RSV testing.  Fact Sheet for Patients: BloggerCourse.com  Fact Sheet  for Healthcare Providers: SeriousBroker.it  This test is not yet approved or cleared by the [place]  FDA and has been authorized for detection and/or diagnosis of SARS-CoV-2 by FDA under an Emergency Use Authorization (EUA). This EUA will remain in effect (meaning this test can be used) for the duration of the COVID-19 declaration under Section 564(b)(1) of the Act, 21 U.S.C. section 360bbb-3(b)(1), unless the authorization is terminated or revoked.     Resp Syncytial Virus by PCR NEGATIVE NEGATIVE Final    Comment: (NOTE) Fact Sheet for Patients: BloggerCourse.com  Fact Sheet for Healthcare Providers: SeriousBroker.it  This test is not yet approved or cleared by the [place]  FDA and has been authorized for detection and/or diagnosis of SARS-CoV-2 by FDA under an Emergency Use Authorization (EUA). This EUA will remain in effect (meaning this test can be used) for the duration of the COVID-19 declaration under Section 564(b)(1) of the Act, 21 U.S.C. section 360bbb-3(b)(1), unless the authorization is terminated or revoked.  Performed at [facility], [address], [place], [place] [address]   Respiratory (~20 pathogens) panel by PCR     Status: Abnormal   Collection Time: 03/14/24 12:29 PM   Specimen: Nasopharyngeal Swab; Respiratory  Result Value Ref Range Status   Adenovirus NOT  DETECTED NOT DETECTED Final   Coronavirus 229E NOT DETECTED NOT DETECTED Final    Comment: (NOTE) The Coronavirus on the Respiratory Panel, DOES NOT test for the novel  Coronavirus (2019 nCoV)    Coronavirus HKU1 NOT DETECTED NOT DETECTED Final   Coronavirus NL63 NOT DETECTED NOT DETECTED Final   Coronavirus OC43 NOT DETECTED NOT DETECTED Final   Metapneumovirus NOT DETECTED NOT DETECTED Final   Rhinovirus / Enterovirus DETECTED (A) NOT DETECTED Final   Influenza A NOT DETECTED NOT DETECTED Final   Influenza B NOT DETECTED NOT DETECTED Final   Parainfluenza Virus 1 NOT DETECTED NOT DETECTED Final   Parainfluenza Virus 2 NOT DETECTED NOT DETECTED Final   Parainfluenza Virus 3 NOT DETECTED NOT DETECTED Final   Parainfluenza Virus 4 NOT DETECTED NOT DETECTED Final   Respiratory Syncytial Virus NOT DETECTED NOT DETECTED Final   Bordetella pertussis NOT DETECTED NOT DETECTED Final   Bordetella Parapertussis NOT DETECTED NOT DETECTED Final   Chlamydophila pneumoniae NOT DETECTED NOT DETECTED Final   Mycoplasma pneumoniae NOT DETECTED NOT DETECTED Final    Comment: Performed at [facility] [facility], [address], [place], [place] [address]     Labs: BNP (last 3 results) Recent Labs    03/14/24 1248 03/16/24 0550  BNP 60.8 77.5   Basic Metabolic Panel: Recent Labs  Lab 03/14/24 1248 03/15/24 0559 03/16/24 0550 03/17/24 0447  NA 138 140 136 136  K 4.3 4.3 4.6 4.2  CL 105 100 102 98  CO2 22 31 26 30   GLUCOSE 129* 114* 199* 172*  BUN 37* 27* 32* 32*  CREATININE 0.82 0.91 0.96 0.91  CALCIUM  9.4 8.9 8.9 9.0  MG  --   --  2.4 2.7*   Liver Function Tests: Recent Labs  Lab 03/14/24 1248  AST 22  ALT 25  ALKPHOS 43  BILITOT 0.5  PROT 6.6  ALBUMIN 3.4*   No results for input(s): "LIPASE", "AMYLASE" in the last 168 hours. No results for input(s): "AMMONIA" in the last 168 hours. CBC: Recent Labs  Lab 03/14/24 1248 03/15/24 0559 03/16/24 0550 03/17/24 0447  WBC 19.7*  12.3* 16.2* 17.1*  NEUTROABS 17.8*  --   --   --  HGB 13.5 12.9 13.0 13.8  HCT 44.1 43.0 44.1 46.5*  MCV 86.5 88.7 88.6 89.6  PLT 230 183 201 223   Cardiac Enzymes: No results for input(s): "CKTOTAL", "CKMB", "CKMBINDEX", "TROPONINI" in the last 168 hours. BNP: Invalid input(s): "POCBNP" CBG: Recent Labs  Lab 03/16/24 1205 03/16/24 1241 03/16/24 1634 03/16/24 2002 03/17/24 0744  GLUCAP 68* 190* 228* 277* 175*   D-Dimer Recent Labs    03/14/24 1657  DDIMER <0.27   Hgb A1c Recent Labs    03/15/24 0559  HGBA1C 7.2*   Lipid Profile No results for input(s): "CHOL", "HDL", "LDLCALC", "TRIG", "CHOLHDL", "LDLDIRECT" in the last 72 hours. Thyroid  function studies No results for input(s): "TSH", "T4TOTAL", "T3FREE", "THYROIDAB" in the last 72 hours.  Invalid input(s): "FREET3" Anemia work up No results for input(s): "VITAMINB12", "FOLATE", "FERRITIN", "TIBC", "IRON", "RETICCTPCT" in the last 72 hours. Urinalysis    Component Value Date/Time   COLORURINE YELLOW 09/03/2015 1110   APPEARANCEUR CLEAR 09/03/2015 1110   LABSPEC 1.028 09/03/2015 1110   PHURINE 5.0 09/03/2015 1110   GLUCOSEU NEGATIVE 09/03/2015 1110   HGBUR NEGATIVE 09/03/2015 1110   BILIRUBINUR NEGATIVE 09/03/2015 1110   BILIRUBINUR small 10/28/2012 1050   KETONESUR NEGATIVE 09/03/2015 1110   PROTEINUR NEGATIVE 09/03/2015 1110   UROBILINOGEN 0.2 09/03/2015 1110   NITRITE NEGATIVE 09/03/2015 1110   LEUKOCYTESUR TRACE (A) 09/03/2015 1110   Sepsis Labs Recent Labs  Lab 03/14/24 1248 03/15/24 0559 03/16/24 0550 03/17/24 0447  WBC 19.7* 12.3* 16.2* 17.1*   Microbiology Recent Results (from the past 240 hours)  Resp panel by RT-PCR (RSV, Flu A&B, Covid) Anterior Nasal Swab     Status: None   Collection Time: 03/14/24 12:29 PM   Specimen: Anterior Nasal Swab  Result Value Ref Range Status   SARS Coronavirus 2 by RT PCR NEGATIVE NEGATIVE Final    Comment: (NOTE) SARS-CoV-2 target nucleic acids are  NOT DETECTED.  The SARS-CoV-2 RNA is generally detectable in upper respiratory specimens during the acute phase of infection. The lowest concentration of SARS-CoV-2 viral copies this assay can detect is 138 copies/mL. A negative result does not preclude SARS-Cov-2 infection and should not be used as the sole basis for treatment or other patient management decisions. A negative result may occur with  improper specimen collection/handling, submission of specimen other than nasopharyngeal swab, presence of viral mutation(s) within the areas targeted by this assay, and inadequate number of viral copies(<138 copies/mL). A negative result must be combined with clinical observations, patient history, and epidemiological information. The expected result is Negative.  Fact Sheet for Patients:  BloggerCourse.com  Fact Sheet for Healthcare Providers:  SeriousBroker.it  This test is no t yet approved or cleared by the [place]  FDA and  has been authorized for detection and/or diagnosis of SARS-CoV-2 by FDA under an Emergency Use Authorization (EUA). This EUA will remain  in effect (meaning this test can be used) for the duration of the COVID-19 declaration under Section 564(b)(1) of the Act, 21 U.S.C.section 360bbb-3(b)(1), unless the authorization is terminated  or revoked sooner.       Influenza A by PCR NEGATIVE NEGATIVE Final   Influenza B by PCR NEGATIVE NEGATIVE Final    Comment: (NOTE) The Xpert Xpress SARS-CoV-2/FLU/RSV plus assay is intended as an aid in the diagnosis of influenza from Nasopharyngeal swab specimens and should not be used as a sole basis for treatment. Nasal washings and aspirates are unacceptable for Xpert Xpress SARS-CoV-2/FLU/RSV testing.  Fact Sheet for Patients: BloggerCourse.com  Fact Sheet for Healthcare Providers: SeriousBroker.it  This test is not  yet approved or cleared by the [place]  FDA and has been authorized for detection and/or diagnosis of SARS-CoV-2 by FDA under an Emergency Use Authorization (EUA). This EUA will remain in effect (meaning this test can be used) for the duration of the COVID-19 declaration under Section 564(b)(1) of the Act, 21 U.S.C. section 360bbb-3(b)(1), unless the authorization is terminated or revoked.     Resp Syncytial Virus by PCR NEGATIVE NEGATIVE Final    Comment: (NOTE) Fact Sheet for Patients: BloggerCourse.com  Fact Sheet for Healthcare Providers: SeriousBroker.it  This test is not yet approved or cleared by the [place]  FDA and has been authorized for detection and/or diagnosis of SARS-CoV-2 by FDA under an Emergency Use Authorization (EUA). This EUA will remain in effect (meaning this test can be used) for the duration of the COVID-19 declaration under Section 564(b)(1) of the Act, 21 U.S.C. section 360bbb-3(b)(1), unless the authorization is terminated or revoked.  Performed at [facility], [address], [place], [place] [address]   Respiratory (~20 pathogens) panel by PCR     Status: Abnormal   Collection Time: 03/14/24 12:29 PM   Specimen: Nasopharyngeal Swab; Respiratory  Result Value Ref Range Status   Adenovirus NOT DETECTED NOT DETECTED Final   Coronavirus 229E NOT DETECTED NOT DETECTED Final    Comment: (NOTE) The Coronavirus on the Respiratory Panel, DOES NOT test for the novel  Coronavirus (2019 nCoV)    Coronavirus HKU1 NOT DETECTED NOT DETECTED Final   Coronavirus NL63 NOT DETECTED NOT DETECTED Final   Coronavirus OC43 NOT DETECTED NOT DETECTED Final   Metapneumovirus NOT DETECTED NOT DETECTED Final   Rhinovirus / Enterovirus DETECTED (A) NOT DETECTED Final   Influenza A NOT DETECTED NOT DETECTED Final   Influenza B NOT DETECTED NOT DETECTED Final   Parainfluenza Virus 1 NOT DETECTED  NOT DETECTED Final   Parainfluenza Virus 2 NOT DETECTED NOT DETECTED Final   Parainfluenza Virus 3 NOT DETECTED NOT DETECTED Final   Parainfluenza Virus 4 NOT DETECTED NOT DETECTED Final   Respiratory Syncytial Virus NOT DETECTED NOT DETECTED Final   Bordetella pertussis NOT DETECTED NOT DETECTED Final   Bordetella Parapertussis NOT DETECTED NOT DETECTED Final   Chlamydophila pneumoniae NOT DETECTED NOT DETECTED Final   Mycoplasma pneumoniae NOT DETECTED NOT DETECTED Final    Comment: Performed at [facility], [address], [place], [place] [address]     Time coordinating discharge:  I have spent 35 minutes face to face with the patient and on the ward discussing the patients care, assessment, plan and disposition with other care givers. >50% of the time was devoted counseling the patient about the risks and benefits of treatment/Discharge disposition and coordinating care.   SIGNED:   Maggie Schooner, MD  [facility] 03/17/2024, 11:17 AM   If 7PM-7AM, please contact night-coverage

## 2024-03-17 NOTE — Telephone Encounter (Signed)
 Needs post hospital follow up with me next week    - 8:30am 03/20/24   03/21/24 -> I am moring off but if thee are no other choices I can see her at 8:30am if CMA avail - LAST CHOICe  4/30 - 16:30 slot

## 2024-03-20 ENCOUNTER — Telehealth: Payer: Self-pay | Admitting: Nurse Practitioner

## 2024-03-20 ENCOUNTER — Telehealth: Payer: Self-pay

## 2024-03-20 ENCOUNTER — Ambulatory Visit: Admitting: Nurse Practitioner

## 2024-03-20 ENCOUNTER — Encounter: Payer: Self-pay | Admitting: Nurse Practitioner

## 2024-03-20 VITALS — BP 142/70 | HR 74 | Ht 59.0 in | Wt 141.2 lb

## 2024-03-20 DIAGNOSIS — B59 Pneumocystosis: Secondary | ICD-10-CM | POA: Diagnosis not present

## 2024-03-20 DIAGNOSIS — Z794 Long term (current) use of insulin: Secondary | ICD-10-CM

## 2024-03-20 DIAGNOSIS — J849 Interstitial pulmonary disease, unspecified: Secondary | ICD-10-CM

## 2024-03-20 DIAGNOSIS — J189 Pneumonia, unspecified organism: Secondary | ICD-10-CM

## 2024-03-20 DIAGNOSIS — E1165 Type 2 diabetes mellitus with hyperglycemia: Secondary | ICD-10-CM

## 2024-03-20 DIAGNOSIS — Z9981 Dependence on supplemental oxygen: Secondary | ICD-10-CM

## 2024-03-20 DIAGNOSIS — J9611 Chronic respiratory failure with hypoxia: Secondary | ICD-10-CM | POA: Diagnosis not present

## 2024-03-20 DIAGNOSIS — J3089 Other allergic rhinitis: Secondary | ICD-10-CM | POA: Diagnosis not present

## 2024-03-20 DIAGNOSIS — I272 Pulmonary hypertension, unspecified: Secondary | ICD-10-CM

## 2024-03-20 DIAGNOSIS — D849 Immunodeficiency, unspecified: Secondary | ICD-10-CM

## 2024-03-20 LAB — FUNGITELL BETA-D-GLUCAN
Fungitell Value:: <31.25 pg/mL
Result Name:: NEGATIVE

## 2024-03-20 MED ORDER — SULFAMETHOXAZOLE-TRIMETHOPRIM 800-160 MG PO TABS
1.0000 | ORAL_TABLET | Freq: Three times a day (TID) | ORAL | 0 refills | Status: DC
Start: 2024-03-20 — End: 2024-04-13

## 2024-03-20 NOTE — Patient Instructions (Addendum)
 Continue Albuterol  inhaler 2 puffs or duoneb 3 mL neb every 6 hours as needed for shortness of breath or wheezing. Notify if symptoms persist despite rescue inhaler/neb use. Use duoneb 2-3 times a day  Continue Breo 1 puff daily. Brush tongue and rinse mouth afterwards Continue flonase  nasal spray 2 sprays each nostril daily Continue xyzal  1 tab daily Continue singulair  1 tab daily Continue budesonide  nebs Twice daily until symptoms improve then you can use as needed for shortness of breathing, cough or wheezing. Brush tongue and rinse mouth afterwards Continue saline nasal rinses 1-2 times a day as needed for sinus symptoms; use bottled distilled water  and saline packets. Follow with flonase  20-30 minutes later Continue guaifenesin  [phone] mg Twice daily for cough/congestion (over the counter)  Continue supplemental oxygen  2 lpm continuous for goal >88-90%. Do not use your POC for now.    -Continue prednisone  60 mg for 14 days, 40 mg for 14 days, 20 mg for 14 days then return to 10 mg daily. Keep close eye on your blood sugars. Your primary care doctor will likely need to adjust your insulin  dosing  -Bactrim  DS Three times a day for 21 days   Labs today   Will set you up for right heart cath  Refer to [facility]   Follow up in 3 weeks with Dr. Bertrum Brodie or Gina Lagos. Ok to [facility] on day not already double booked. If symptoms do not improve or worsen, please contact office for sooner follow up or seek emergency care.

## 2024-03-20 NOTE — Progress Notes (Signed)
 @Patient  ID: Rhonda [name], female    DOB: 1951/01/16, 73 y.o.   MRN: [id number]  Chief Complaint  Patient presents with   Follow-up    Hospital follow-up. Pt states she was at the hospital for lung inflammation, severe sob and hypoxia when coughing or moving around.    Referring provider: Allene Ivan, MD  HPI: 73 year old female, never smoker followed for ILD in the setting of hypersensitivity pneumonitis.She is a patient of Dr. Mardell Shade and last seen in office 03/06/2024 by [facility] NP.  Past medical history significant for significant environmental allergies on immunotherapy, mitral valve prolapse, pulmonary hypertension, CAD, GERD, IBS, depression, HLD, diabetes.  TEST/EVENTS:  01/2018 serologies negative  01/27/2023 HRCT chest: atherosclerosis. Small hiatal hernia. Moderate pulmonary fibrosis. Areas of btx. Lobular air trapping. Not UIP; chronic fibrotic phase hypersensitivity pneumonitis.  06/04/2023 PFT: FVC 60, FEV1 73, ratio 91, DLCO 64 01/2024 FeNO nl 02/16/2024 CXR: chronic ILD with fibrosis and btx. Increased ground glass opacity in left mid to lower lung 03/08/2024 CT chest: atherosclerosis. No LAD. Small hiatal hernia. Similar appearance of chronic interstitial coarsening with scattered areas of subpleural reticulation and honeycombing. Patchy areas of ground glass b/l. Chronic changes more pronounced at lung bases. New nodular consolidative opacity in left base, 18x10 mm.  03/14/2024 CT chest: mild atherosclerosis. No adenopathy. Similar appearance of ILD. No focal consolidation.  03/15/2024 Fungitell negative   10/06/2023: OV with Dr. Bertrum Brodie.  Breathing doing well.  Feels stable.  On chronic prednisone , 20 mg daily.  Failed Ofev  due to side effects.  Failed Esbriet  due to side effects.  Declined lung transplant referral.  Had declined CellCept  May 2024.  High-resolution CT chest from November 2022 to March 2024 without any significant change.  Had an echocardiogram in April  2024 that showed mild elevation of pulmonary artery pressures.  Right heart catheterization was declined due to doing well.  PFT is stable.  Exercise hypoxemia test improved without any supplemental O2 required.  Having side effects with dry skin and hyperglycemia with prednisone .  Not currently on PJP prophylaxis.  02/16/2024: Margarito Shearing with Darnisha Vernet NP Discussed the use of AI scribe software for clinical note transcription with the patient, who gave verbal consent to proceed. MADGE SOUFFRONT "Rhonda [name]" is a 73 year old female with chronic HP ILD presents with worsening breathing difficulties and cough.   She was last seen in November 2024.  She had sent a message in early March 2025 reporting difficulties with managing her blood sugar and having to decrease her prednisone  down to 15 mg from 20 mg daily.  She was still having very high blood sugars and had to start on insulin  therapy.  Dr. Bertrum Brodie recommended that she start CellCept  as a way to reduce steroids.  She initiated CellCept  therapy around 02/03/2024. She was scheduled today for acute visit.  She was experiencing worsening breathing difficulties and a productive cough with dark yellow sputum. Her primary care physician administered an 80 mg steroid shot and started her on doxycycline  two days ago, 3/24. She was also treated with duoneb in her office. Despite not increasing her oral steroids due to concerns about insulin  management, she started feeling better but still experiences airway noises and cough. The cough has started to lessen but still bothersome. Breathing is not quite back to normal but getting better. Still wheezing some. Over the weekend, she required oxygen  as her levels dropped into the seventies during activities, which was not alleviated by her usual methods of resting  during exertion. Since her visit Monday, she's not had any low O2 levels. No coughing up blood, fevers, or chills. She reports a decreased appetite. She started taking CellCept   on February 11, 2024, and experienced dizziness and blurry vision over the weekend. This was associated with the other breathing issues as well. She had labs at her PCP which were normal.  She has a history of significant environmental allergies and is on allergy shots. She's also on Xyzal , Flonase , and Singulair . She avoids using astelin spray due to it causing coughing fits. She is currently on 20 mg of prednisone  for the last week. Prior to this, she had tapered down to 15 mg daily. She notes that April is a particularly difficult month for her allergies but allergies overall are better with the immunotherapy than they have been in the past.  She uses a [employer] for glucose monitoring but has experienced discrepancies between its readings and finger stick tests. She recently encountered a malfunctioning sensor, which was 'at least twenty points off' from her blood stick reading. She plans to resume finger stick testing to ensure accuracy. Her sugars on her fingersticks have been stable.  FeNO nl  03/06/2024: OV with Henya Aguallo NP  Discussed the use of AI scribe software for clinical note transcription with the patient, who gave verbal consent to proceed. History of Present Illness   MAYLIN KESSELRING "Rhonda [name]" is a 73 year old female with chronic lung disease who presents with worsening respiratory symptoms. She was seen 02/16/2024 for acute cough and DOE. She was treated by her PCP with doxycycline . Prednisone  taper was initiated at visit. She had a CXR that showed LML and LLL opacity, concerning for pneumonia, so cefpodoxime  was added for broader antimicrobial coverage.  She is experiencing a recurrence of respiratory symptoms after completing a course of antibiotics. She was feeling much better but last week around 4/7, her symptoms returned. She went back to Dr. Kurt Phi, who restarted her on abx. Currently, she is taking doxycycline  and cefpodoxime . Despite this, she continues to experience significant  shortness of breath, especially with any sort of distance, and requires supplemental oxygen , which she uses at night and during the day when her oxygen  levels are below 90%.  There is a reduction in coughing frequency, but when she does cough, she produces yellow to dark yellow sputum. She experiences dizziness with the initial cough but quickly resolves. No syncope. She denies any fevers, chills, hemoptysis. She uses nebulizers in the morning and evening, which help reduce coughing and wheezing, although they sometimes make her feel jittery. She is not currently using Mucinex . Her prednisone  dosage was recently reduced to 20 mg, but she increased it back to 30 mg due to symptom recurrence. She has been on prednisone  for an extended period. She has had some weight gain of 4 lb and puffiness, which she tells me usually doesn't happen with prednisone  use. She has not resumed taking CellCept  due to her current illness and has not started Bactrim  as she has not been on the CellCept .  No sinus fullness. She does have some nasal drainage. Usually clear but has had small amount of blood tinged mucus when she blows her nose. No significant epistaxis. She does use flonase  nasal spray. Can't tolerate astelin as it makes her cough more. She takes xyzal  and singulair . She is still on maintenance shots for her allergies.  She uses a Dexcom for blood sugar monitoring and reports better control, with occasional low dips but no  extreme highs. The Dexcom works much better than [employer] did.  She is taking care of her granddaughter, which is challenging given her current health condition.     03/20/2024: Today - follow up Patient presents today for hospital follow up. After our last visit, she started to decline and have increased oxygen  requirements. She was advised to go to the ED for further evaluation, where she was admitted 4/22-4/25. CT chest showed resolution of previously identified nodular opacity. Felt symptoms were  most consistent with flare of HP. Procalcitonin was negative. RVP was positive for rhinovirus. Given increased oxygen  requirements, reviewed risks of worsening post bronchoscopy so decision was made to postpone. Also felt there was low benefit. Recommendation was to treat with high dose steroids. Fungitell negative. There was also discussion surrounding possibility of RHC in future to assess for PH-ILD.   Discussed the use of AI scribe software for clinical note transcription with the patient, who gave verbal consent to proceed.  She has experienced a worsening of her respiratory symptoms since being discharged from the hospital. She was feeling some better up until yesterday. Her cough has become productive of yellow sputum again. Her symptoms improved during hospitalization but have deteriorated since returning home. She is currently on high-dose steroid taper. She's unable to do much without getting short winded.   She is on supplemental oxygen  and can maintain her levels above 90% when sitting still, but requires 4-5 lpm POC when moving at home or she will have levels in the 80's. In the hospital, she was on 2 liters per minute while walking prior to discharge. Prior to this illness, she was not on oxygen  24/7 and seldomly used with activity. She is also taking Bactrim  on Monday, Wednesday, and Friday for PJP prophylaxis.   She is having some difficulties with her blood sugars since starting the high dosed steroids, with levels reaching over 350 at times, causing symptoms such as blurry vision and shakiness. She manages her blood sugars with insulin  and has a continuous glucose monitor. Her blood sugars are more stable in the morning but spike in the afternoon. She has an appointment tomorrow with her PCP to discuss.   She completed a course of Levaquin  prior to her hospital admission, which seemed to improve her symptoms, but was discontinued upon admission. Her prior sputum culture was inadequate.  AFB and fungal pending.    Sinus drainage has improved and is now clear.  No fevers, chills, or hemoptysis. Eating and drinking well. She experiences panting and shortness of breath with minimal exertion.        Allergies  Allergen Reactions   Ozempic (0.25 Or 0.5 Mg-Dose) [Semaglutide(0.25 Or 0.5mg -Dos)] Other (See Comments)    "Paralyzed the stomach"   Tape Other (See Comments)    SKIN IS VERY FRAGILE!! "Burns and pulls up" the skin!! SKIN BRUISES and TEARS EASILY.Aaron Aas   Atorvastatin Other (See Comments)    Leg pain   Betadine [Povidone Iodine] Other (See Comments)    Blisters    Codeine Nausea And Vomiting   Garlic Diarrhea   Hydrocodone  Nausea And Vomiting   Macrobid [Nitrofurantoin] Other (See Comments)   Ofev  [Nintedanib] Other (See Comments)    Abdominal pain   Onion Diarrhea   Rosuvastatin Other (See Comments)    Leg pain   Shellfish Allergy Nausea And Vomiting   Statins Other (See Comments)    "Leg pain," but Pravachol  is tolerated   Sulfa  Antibiotics Other (See Comments)    Headaches  Immunization History  Administered Date(s) Administered   Fluad Quad(high Dose 65+) 07/17/2019   Hepatitis A 05/23/2010   Hepatitis B 06/23/2006   Influenza Split 08/11/2012, 08/14/2017   Influenza Whole 09/08/2011   Influenza, High Dose Seasonal PF 08/24/2016, 07/24/2017, 08/31/2018, 08/15/2020, 09/26/2020, 09/12/2021, 08/24/2022   Influenza,inj,Quad PF,6+ Mos 09/07/2013   Influenza-Unspecified 09/26/2014, 07/27/2015, 08/24/2023   MMR 05/23/2010   Moderna Sars-Covid-2 Vaccination 07/30/2021, 01/01/2022   PFIZER(Purple Top)SARS-COV-2 Vaccination 12/16/2019, 01/08/2020, 07/18/2020, 08/11/2022   Pneumococcal Conjugate-13 03/15/2014   Pneumococcal Polysaccharide-23 01/11/2018, 09/26/2020, 08/26/2021   Respiratory Syncytial Virus Vaccine,Recomb Aduvanted(Arexvy) 07/28/2022   Td 02/22/2003   Tdap 10/28/2012   Zoster Recombinant(Shingrix) 02/15/2017, 05/17/2017   Zoster, Live  12/04/2010    Past Medical History:  Diagnosis Date   Allergy    Arthritis    history spinal stenosis. osteoarthritis right hip   Asthma    Cataract    Coronary artery calcification seen on CAT scan 08/19/2017   >300 on CT scan 08/2017   DDD (degenerative disc disease), thoracic    Depression    DOE (dyspnea on exertion)    a. 04/2010 Lexi MV EF 71%, no ischemia/infarct;     Fatty liver    GERD (gastroesophageal reflux disease)    H/O steroid therapy    Steroid use orally over 4 yrs- for Lung Fibrosis   Heart palpitations 02/28/2015   Helicobacter pylori ab+    Hemorrhoids    Hiatal hernia    High cholesterol    History of chronic bronchitis    as child   History of migraine    History of MRSA infection    Hyperlipidemia, mixed 08/19/2017   Hyperplastic colon polyp 2007   IBS (irritable bowel syndrome)    Inguinal hernia    right   Insulin  resistance    past   Interstitial lung disease (HCC)    MVP (mitral valve prolapse)    Posterior mitral valve leaflet with mild MR   Pneumonia    Pneumonitis, hypersensitivity (HCC)    a. 09/2012 s/p Bx - ? 2/2 bird, mold, oil paint exposure ->on steroids, followed by pulm.   PONV (postoperative nausea and vomiting)    Pre-diabetes    takes Metformin    Pulmonary fibrosis (HCC)    Dr. Bertrum Brodie follows- stable at present   PVC (premature ventricular contraction) 08/19/2017   Rapid heart rate    Dr. Micael Adas follows- last visit Epic note 9'16   Squamous cell carcinoma of skin    Tinnitus, right ear    Vocal cord ulcer     Tobacco History: Social History   Tobacco Use  Smoking Status Never  Smokeless Tobacco Never  Tobacco Comments   pt states she experimented in college   Counseling given: Not Answered Tobacco comments: pt states she experimented in college   Outpatient Medications Prior to Visit  Medication Sig Dispense Refill   acetaminophen  (TYLENOL ) 500 MG tablet Take 500 mg by mouth every 6 (six) hours as needed  for mild pain (pain score 1-3) or headache.     albuterol  (PROAIR  HFA) 108 (90 Base) MCG/ACT inhaler Inhale 2 puffs into the lungs every 4 (four) hours as needed for wheezing. Or coughing spells.  You may use 2 Puffs 5-10 minutes before exercise. 1 each 3   Alpha-D-Galactosidase (BEANO PO) Take 1 tablet by mouth as needed (as directed, if eating onion or garlic).     AMBULATORY NON FORMULARY MEDICATION See admin instructions. Allergy injections on Mondays and hold when  sick     ascorbic acid (VITAMIN C) 500 MG tablet Take 500 mg by mouth daily.     ASHWAGANDHA PO Take 1 capsule by mouth every other day.     budesonide  (PULMICORT ) 0.5 MG/2ML nebulizer solution Take 2 mLs (0.5 mg total) by nebulization in the morning and at bedtime. 360 mL 0   Calcium  Carb-Cholecalciferol (CALCIUM  500 + D PO) Take 2 tablets by mouth See admin instructions. Chew 2 gummies by mouth once a day     chlorpheniramine (CHLOR-TRIMETON) 4 MG tablet Take 2 mg by mouth 2 (two) times daily as needed for allergies or rhinitis.     cholecalciferol (VITAMIN D3) 25 MCG (1000 UNIT) tablet Take 1,000 Units by mouth every other day.     Continuous Glucose Sensor (DEXCOM G7 SENSOR) MISC Inject 1 Device into the skin See admin instructions. Place one new sensor into the skin every 10 days     dicyclomine  (BENTYL ) 10 MG capsule Take 10 mg by mouth daily as needed for spasms.     diltiazem  (CARDIZEM  CD) 120 MG 24 hr capsule Take 1 capsule (120 mg total) by mouth daily. 90 capsule 1   diphenhydrAMINE -PE-APAP (DELSYM COUGH/COLD NIGHT TIME) 12.5-5-325 MG/10ML LIQD Take 10 mLs by mouth every 12 (twelve) hours as needed (for coughing).     EPINEPHrine  0.3 mg/0.3 mL IJ SOAJ injection Inject 0.3 mg into the muscle as needed for anaphylaxis.     ezetimibe  (ZETIA ) 10 MG tablet Take 1 tablet (10 mg total) by mouth daily. (Patient taking differently: Take 10 mg by mouth at bedtime.) 90 tablet 3   fluticasone  (FLONASE ) 50 MCG/ACT nasal spray Spray 2  sprays into each nostril every day. (Patient taking differently: Place 2 sprays into both nostrils in the morning.) 48 g 12   fluticasone  furoate-vilanterol (BREO ELLIPTA ) 100-25 MCG/ACT AEPB Inhale 1 puff into the lungs daily. 60 each 0   HUMALOG  KWIKPEN 100 UNIT/ML KwikPen Inject 8 Units into the skin 3 (three) times daily before meals. Taken 9 unit.     Hypromellose (NATURES TEARS OP) Place 1 drop into both eyes in the morning and at bedtime.     ipratropium-albuterol  (DUONEB) 0.5-2.5 (3) MG/3ML SOLN Take 3 mLs by nebulization every 6 (six) hours as needed (for shortness of breath or wheezing or when sick). 360 mL 0   LANTUS  SOLOSTAR 100 UNIT/ML Solostar Pen Inject 16 Units into the skin daily before breakfast.     levocetirizine (XYZAL ) 5 MG tablet Take 5 mg by mouth at bedtime.     metFORMIN  (GLUCOPHAGE -XR) 500 MG 24 hr tablet Take 500 mg by mouth daily with breakfast.     metoprolol  succinate (TOPROL -XL) 25 MG 24 hr tablet Take 1 tablet (25 mg total) by mouth daily. 90 tablet 3   montelukast  (SINGULAIR ) 10 MG tablet TAKE 1 TABLET BY MOUTH EVERYDAY AT BEDTIME (Patient taking differently: Take 10 mg by mouth at bedtime.) 90 tablet 2   Multiple Vitamin (MULTIVITAMIN) tablet Take 1 tablet by mouth daily with breakfast.     mycophenolate  (CELLCEPT ) 500 MG tablet Take 1 tab twice daily x 2 weeks. REPEAT LABS. If stable, increase to 2 tabs twice daily x 2 weeks. REPEAT LABS. If stable, increase to 3 tabs twice daily 252 tablet 0   omeprazole  (PRILOSEC  OTC) 20 MG tablet Take 10 mg by mouth daily before breakfast.     OXYGEN  Inhale 4-5 L/min into the lungs See admin instructions. Inhale 4-5 L/min at bedtime and as  needed for shortness of breath during the day     pravastatin  (PRAVACHOL ) 40 MG tablet Take 1 tablet (40 mg total) by mouth every evening. (Patient taking differently: Take 40 mg by mouth at bedtime.) 90 tablet 1   predniSONE  (DELTASONE ) 10 MG tablet Take 6 tablets (60 mg total) by mouth daily  with breakfast for 14 days, THEN 4 tablets (40 mg total) daily with breakfast for 14 days, THEN 3 tablets (30 mg total) daily with breakfast for 14 days, THEN 2 tablets (20 mg total) daily with breakfast for 14 days, THEN 1 tablet (10 mg total) daily with breakfast. 240 tablet 0   sodium chloride  (OCEAN) 0.65 % SOLN nasal spray Place 1 spray into both nostrils as needed for congestion.     Sodium Chloride , Inhalant, 7 % NEBU Use per nebulizer once a day to help clear sputum 120 mL 3   sulfamethoxazole -trimethoprim  (BACTRIM  DS) 800-160 MG tablet Take 1 tablet by mouth every Monday, Wednesday, and Friday. (START after completing two weeks of CELLCEPT  1000mg  twice daily ) 36 tablet 0   nitroGLYCERIN  (NITROSTAT ) 0.4 MG SL tablet Place 1 tablet (0.4 mg total) under the tongue every 5 (five) minutes as needed for chest pain. 25 tablet 12   No facility-administered medications prior to visit.     Review of Systems:   Constitutional: No weight change, night sweats, fevers, chills, or lassitude. +fatigue HEENT: No headaches, difficulty swallowing, tooth/dental problems, or sore throat. No sneezing, itching, ear ache +nasal congestion, post nasal drip (improved) CV:  No chest pain, orthopnea, PND, swelling in lower extremities, anasarca, palpitations, syncope Resp: +shortness of breath with exertion; productive cough; wheeze. No hemoptysis. No chest wall deformity GI:  No heartburn, indigestion, abdominal pain, nausea, vomiting, diarrhea, change in bowel habits, loss of appetite, bloody stools.  GU: No dysuria, change in color of urine, urgency or frequency.  No flank pain, no hematuria  Skin: No rash, lesions, ulcerations MSK:  No joint pain or swelling.   Neuro:  No gait abnormality, memory impairment  Psych: No depression or anxiety. Mood stable.     Physical Exam:  BP (!) 142/70 (BP Location: Right Arm, Patient Position: Sitting)   Ht 4\' 11"  (1.499 m)   Wt 141 lb 3.2 oz (64 kg)   SpO2 97%    BMI 28.52 kg/m   GEN: Pleasant, interactive, chronically ill appearing; non-toxic and in no acute distress HEENT:  Normocephalic and atraumatic. PERRLA. Sclera white. Nasal turbinates pink, moist and patent bilaterally. No rhinorrhea present. Oropharynx pink and moist, without exudate or edema. No lesions, ulcerations, or postnasal drip.  NECK:  Supple w/ fair ROM. No JVD present. Normal carotid impulses w/o bruits. Thyroid  symmetrical with no goiter or nodules palpated. No lymphadenopathy.   CV: RRR, no m/r/g, no peripheral edema. Pulses intact, +2 bilaterally. No cyanosis, pallor or clubbing. PULMONARY:  Unlabored, regular breathing. Relatively clear with some minimal scattered rhonchi in b/l bases and with cough. No wheezing. Congested cough. No accessory muscle use.  GI: BS present and normoactive. Soft, non-tender to palpation. No organomegaly or masses detected.  MSK: No erythema, warmth or tenderness. Cap refil <2 sec all extrem. No deformities or joint swelling noted.  Neuro: A/Ox3. No focal deficits noted.   Skin: Warm, no lesions or rashe Psych: Normal affect and behavior. Judgement and thought content appropriate.     Lab Results:  CBC    Component Value Date/Time   WBC 17.1 (H) 03/17/2024 0447   RBC  5.19 (H) 03/17/2024 0447   HGB 13.8 03/17/2024 0447   HCT 46.5 (H) 03/17/2024 0447   PLT 223 03/17/2024 0447   MCV 89.6 03/17/2024 0447   MCH 26.6 03/17/2024 0447   MCHC 29.7 (L) 03/17/2024 0447   RDW 16.0 (H) 03/17/2024 0447   LYMPHSABS 1.0 03/14/2024 1248   MONOABS 0.7 03/14/2024 1248   EOSABS 0.0 03/14/2024 1248   BASOSABS 0.0 03/14/2024 1248    BMET    Component Value Date/Time   NA 136 03/17/2024 0447   NA 142 12/08/2018 0917   K 4.2 03/17/2024 0447   CL 98 03/17/2024 0447   CO2 30 03/17/2024 0447   GLUCOSE 172 (H) 03/17/2024 0447   BUN 32 (H) 03/17/2024 0447   BUN 18 12/08/2018 0917   CREATININE 0.91 03/17/2024 0447   CREATININE 0.93 02/03/2024 1552    CALCIUM  9.0 03/17/2024 0447   GFRNONAA >60 03/17/2024 0447   GFRNONAA 66 02/26/2015 1540   GFRAA >60 10/04/2019 0216   GFRAA 77 02/26/2015 1540    BNP    Component Value Date/Time   BNP 77.5 03/16/2024 0550     Imaging:  CT CHEST WO CONTRAST Result Date: 03/14/2024 CLINICAL DATA:  Pulmonary fibrosis. Lung nodule. Shortness of breath. EXAM: CT CHEST WITHOUT CONTRAST TECHNIQUE: Multidetector CT imaging of the chest was performed following the standard protocol without IV contrast. RADIATION DOSE REDUCTION: This exam was performed according to the departmental dose-optimization program which includes automated exposure control, adjustment of the mA and/or kV according to patient size and/or use of iterative reconstruction technique. COMPARISON:  Chest CT dated 03/08/2024. FINDINGS: Evaluation of this exam is limited in the absence of intravenous contrast. Cardiovascular: There is no cardiomegaly or pericardial effusion. Three-vessel coronary vascular calcification. Mild atherosclerotic calcification of the thoracic aorta. No aneurysmal dilatation. The central pulmonary arteries are grossly unremarkable. Mediastinum/Nodes: No hilar or mediastinal adenopathy. The esophagus is grossly unremarkable no mediastinal fluid collection. Lungs/Pleura: Similar appearance of diffuse interstitial coarsening, reticulation, and bronchiectasis in keeping with interstitial lung disease. No focal consolidation, pleural effusion, or pneumothorax. The central airways are patent. Upper Abdomen: No acute abnormality. Musculoskeletal: Osteopenia with degenerative changes. No acute osseous pathology. IMPRESSION: 1. Similar appearance of interstitial lung disease. No focal consolidation. 2. Three-vessel coronary vascular calcification. 3.  Aortic Atherosclerosis (ICD10-I70.0). Electronically Signed   By: Angus Bark M.D.   On: 03/14/2024 16:15   DG Chest 2 View Result Date: 03/14/2024 CLINICAL DATA:  Cough and dyspnea  EXAM: CHEST - 2 VIEW COMPARISON:  March 06, 2024 FINDINGS: Chronic interstitial lung disease. Without evidence of acute infiltrates or consolidations. Overall no significant change since prior examination No pleural effusions.  Heart and mediastinum normal IMPRESSION: Chronic interstitial lung disease. Electronically Signed   By: Fredrich Jefferson M.D.   On: 03/14/2024 14:50   CT CHEST WO CONTRAST Result Date: 03/08/2024 CLINICAL DATA:  Chronic dyspnea.  Community-acquired pneumonia. EXAM: CT CHEST WITHOUT CONTRAST TECHNIQUE: Multidetector CT imaging of the chest was performed following the standard protocol without IV contrast. RADIATION DOSE REDUCTION: This exam was performed according to the departmental dose-optimization program which includes automated exposure control, adjustment of the mA and/or kV according to patient size and/or use of iterative reconstruction technique. COMPARISON:  01/27/2023 FINDINGS: Cardiovascular: The heart size is upper normal to borderline enlarged. Coronary artery calcification is evident. Mild atherosclerotic calcification is noted in the wall of the thoracic aorta. Mediastinum/Nodes: No mediastinal lymphadenopathy. No evidence for gross hilar lymphadenopathy although assessment is limited by  the lack of intravenous contrast on the current study. Small hiatal hernia. The esophagus has normal imaging features. There is no axillary lymphadenopathy. Lungs/Pleura: Similar appearance of chronic interstitial coarsening with scattered areas of subpleural reticulation and honeycombing. Patchy areas of ground-glass opacity are noted bilaterally. Chronic changes become more pronounced at the lung bases. Evidence of prior surgery in the anterior right lung base. There is a new nodular consolidative opacity in the peripheral left base measuring 18 x 10 mm on image 76/4. Upper Abdomen: Visualized portion of the upper abdomen shows no acute findings. Musculoskeletal: No worrisome lytic or  sclerotic osseous abnormality. IMPRESSION: 1. Similar appearance of chronic interstitial coarsening with scattered areas of subpleural reticulation and honeycombing. Patchy areas of ground-glass opacity are noted bilaterally. Imaging features are compatible with interstitial lung disease considered suggestive of non UIP etiology with chronic fibrotic face hypersensitivity pneumonitis favored. 2. New nodular consolidative opacity in the peripheral left base measuring 18 x 10 mm. Imaging features are compatible with an infectious/inflammatory process. Follow-up CT chest in 3 months recommended to ensure resolution. 3. Small hiatal hernia. 4.  Aortic Atherosclerois (ICD10-170.0) Electronically Signed   By: Donnal Fusi M.D.   On: 03/08/2024 11:32   DG Chest 2 View Result Date: 03/06/2024 CLINICAL DATA:  Unresolved cough. EXAM: CHEST - 2 VIEW COMPARISON:  02/16/2024 FINDINGS: Similar appearance underlying chronic interstitial lung disease. The ill-defined airspace opacity in the left lung described previously is stable in the interval. No pleural effusion. No pneumothorax. The cardiopericardial silhouette is within normal limits for size. No acute bony abnormality. IMPRESSION: 1. Similar appearance of underlying chronic interstitial lung disease. 2. Stable ill-defined airspace opacity in the left lung. Findings may reflect component of the pulmonary fibrosis although persistent infection could have this appearance. Continued follow-up warranted. Electronically Signed   By: Donnal Fusi M.D.   On: 03/06/2024 10:39    Administration History     None          Latest Ref Rng & Units 10/01/2023    9:49 AM 06/04/2023   10:12 AM 03/08/2023   10:49 AM 12/09/2022   11:48 AM 07/21/2022    9:50 AM 01/06/2022    9:51 AM 10/03/2021    8:59 AM  PFT Results  FVC-Pre L 1.43  1.44  1.45  1.53  1.54  1.58  1.48   FVC-Predicted Pre % 60  60  64  66  67  67  63   Pre FEV1/FVC % % 92  91  93  87  91  90  92   FEV1-Pre  L 1.31  1.30  1.35  1.32  1.40  1.43  1.37   FEV1-Predicted Pre % 73  73  80  77  81  81  78   DLCO uncorrected ml/min/mmHg 9.73  10.82  8.11  9.64  8.16  8.62  8.92   DLCO UNC% % 58  64  50  59  50  53  55   DLCO corrected ml/min/mmHg 9.73  10.82  7.81  9.41  7.86  8.62  8.92   DLCO COR %Predicted % 58  64  48  58  48  53  55   DLVA Predicted % 81  85  65  81  64  75  78   TLC L   3.02       TLC % Predicted %   70       RV % Predicted %   70  Lab Results  Component Value Date   NITRICOXIDE 10 01/13/2016        Assessment & Plan:      Pneumocystis pneumonia? Recurrent infectious symptoms and failure to improve despite high dose corticosteroids. Would expect better response if this was solely HP flare. Consideration of bronchoscopy, but concerns about her current health status and ability to tolerate the procedure. Extensive discussion with Dr. Bertrum Brodie and pt. Plan to treat as if pneumocystis pneumonia given immunocompromised state and lack of improvement.  - Bactrim  DS Three times a day for 21 days  - Check LDH - Prior nl G6PD - Consider bronchoscopy if symptoms do not improve with antibiotics - Ensure adequate hydration - Monitor sputum production and attempt sputum culture if possible - Follow AFB and fungal  - Double check home for signs of feathers or mold that could be causing HP flare - Set up right heart catheterization to rule out pulmonary hypertension as contributing factor to dyspnea and ongoing oxygen  requirements  ILD  ILD in the setting of hypersensitivity pneumonitis, on chronic steroids and previously started on CellCept  but held after one week.  Recent hospitalization due to failure to improve. Slow to resolve with worsening symptoms today. Prior BNP normal; although with PH ILD, do not always see this elevated. See above. Continue prednisone  taper with close BS monitoring. Continue aggressive maintenance regimen. Close follow up. Strict ED precautions.   Given decline and concern for disease progression, will refer her to [facility] Transplant team for evaluation.  - Continue prednisone  taper as outlined: 60 mg for 2 weeks, then taper to 40 mg for 2 weeks, 30 mg for 2 weeks, 20 mg for 2 weeks, then resume 10 mg daily - Continue Breo - Continue albuterol  nebs and HFA PRN - Continue budesonide   Environmental and seasonal allergies Significant history. On maintenance immunotherapy. Continue aggressive maintenance regimen - Follow up with allergist as scheduled  Chronic respiratory failure  Increased oxygen  requirements from baseline. She was not able to tolerate pulsed dose therapy with exertion today. She did well on 2 lpm continuous. Advised to use continuous oxygen  with portable tanks over POC for now. Goal >88-90% - Continue supplemental oxygen    Diabetes mellitus with hyperglycemia Blood sugars elevated due to high dose prednisone . Will likely need insulin  adjusted while on therapy.  - Monitor blood glucose levels closely - Consult with primary care physician regarding insulin  adjustments  Pulmonary hypertension Concern for PH-ILD. Will set her up with [facility] with Dr. Mclean or Dr. Julane Ny.  - See above  Follow up - 3 weeks with Dr. Bertrum Brodie or Gina Lagos       Advised if symptoms do not improve or worsen, to please contact office for sooner follow up or seek emergency care.   I spent 60 minutes of dedicated to the care of this patient on the date of this encounter to include pre-visit review of records, face-to-face time with the patient discussing conditions above, post visit ordering of testing, clinical documentation with the electronic health record, making appropriate referrals as documented, and communicating necessary findings to members of the patients care team.  Roetta Clarke, NP 03/20/2024  Pt aware and understands NP's role.

## 2024-03-20 NOTE — Telephone Encounter (Signed)
 Copied from CRM [id number]. Topic: Clinical - Prescription Issue >> Mar 20, 2024 10:32 AM Crist Dominion wrote: Reason for CRM: Burdette Carolin from [facility] requesting confirmation on patients prescription for sulfamethoxazole -trimethoprim  (BACTRIM  DS) [id number] MG tablet  -Pharmacy needs to know if this is for 3 times a day or 3 times a week. Please call [phone]       Spoke with pharmacy gave them the clarification on how the order should be

## 2024-03-21 ENCOUNTER — Encounter: Payer: Self-pay | Admitting: Internal Medicine

## 2024-03-21 ENCOUNTER — Other Ambulatory Visit (HOSPITAL_COMMUNITY): Payer: Self-pay

## 2024-03-21 ENCOUNTER — Telehealth (HOSPITAL_COMMUNITY): Payer: Self-pay

## 2024-03-21 DIAGNOSIS — J849 Interstitial pulmonary disease, unspecified: Secondary | ICD-10-CM

## 2024-03-21 LAB — LACTATE DEHYDROGENASE: LDH: 261 U/L — ABNORMAL HIGH (ref 120–250)

## 2024-03-21 NOTE — Telephone Encounter (Signed)
 Thanks team. This would be SOC. NOt research

## 2024-03-21 NOTE — Telephone Encounter (Signed)
 Pharmacy has been updated.

## 2024-03-21 NOTE — Progress Notes (Signed)
 LDH elevated -  so PJP not excluded. Will see what happens to bactrim 

## 2024-03-21 NOTE — Telephone Encounter (Signed)
 Patient called and [facility] scheduled for 04/04/24 @ 9.00 am arrival time is 7.00 am. Patient aware reviewed instructions with patient, letter of instructions also mailed to patient. PA sent to pulmonology

## 2024-03-21 NOTE — H&P (View-Only) (Signed)
 LDH elevated -  so PJP not excluded. Will see what happens to bactrim 

## 2024-03-22 ENCOUNTER — Ambulatory Visit: Admitting: Internal Medicine

## 2024-03-23 NOTE — Telephone Encounter (Signed)
 [facility] needs prior u auth

## 2024-03-23 NOTE — Telephone Encounter (Signed)
 FYI

## 2024-03-24 ENCOUNTER — Other Ambulatory Visit: Payer: Self-pay | Admitting: Cardiology

## 2024-03-24 NOTE — Telephone Encounter (Signed)
 I will send this to [facility]

## 2024-03-29 NOTE — Telephone Encounter (Signed)
 She texted me saying she was better.  This was a few days ago.  Please check if there are any ongoing concerns at this point in time.  She was quite ill

## 2024-03-30 NOTE — Telephone Encounter (Signed)
 Tried calling and there was no answer- left detailed msg and will send mychart msg.

## 2024-04-03 ENCOUNTER — Telehealth: Payer: Self-pay | Admitting: Internal Medicine

## 2024-04-03 DIAGNOSIS — J849 Interstitial pulmonary disease, unspecified: Secondary | ICD-10-CM

## 2024-04-03 DIAGNOSIS — B59 Pneumocystosis: Secondary | ICD-10-CM

## 2024-04-03 DIAGNOSIS — D849 Immunodeficiency, unspecified: Secondary | ICD-10-CM

## 2024-04-03 LAB — FUNGUS CULTURE W SMEAR
MICRO NUMBER:: 16331816
SMEAR:: NONE SEEN
SPECIMEN QUALITY:: ADEQUATE

## 2024-04-03 NOTE — Telephone Encounter (Signed)
 She will if she is having intolerance she will if she is having intolerance to Bactrim .  Bactrim  was supposed to end Apr 08, 2024.  Nevertheless oxygenation improved after steroids and Bactrim .  Plan - I have ordered CBC, chemistry magnesium  phosphorus and liver function test -0 she can come anytime and do it

## 2024-04-04 ENCOUNTER — Other Ambulatory Visit (HOSPITAL_BASED_OUTPATIENT_CLINIC_OR_DEPARTMENT_OTHER): Payer: Self-pay

## 2024-04-04 ENCOUNTER — Encounter (HOSPITAL_COMMUNITY)
Admission: RE | Disposition: A | Discharge status: Home / Self Care | Payer: Self-pay | Source: Home / Self Care | Attending: Cardiology

## 2024-04-04 ENCOUNTER — Ambulatory Visit (HOSPITAL_COMMUNITY)
Admission: RE | Admit: 2024-04-04 | Discharge: 2024-04-04 | Disposition: A | Discharge status: Home / Self Care | Attending: Cardiology | Admitting: Cardiology

## 2024-04-04 ENCOUNTER — Other Ambulatory Visit: Payer: Self-pay

## 2024-04-04 ENCOUNTER — Encounter (HOSPITAL_COMMUNITY): Payer: Self-pay | Admitting: Cardiology

## 2024-04-04 DIAGNOSIS — I272 Pulmonary hypertension, unspecified: Secondary | ICD-10-CM | POA: Diagnosis not present

## 2024-04-04 DIAGNOSIS — J849 Interstitial pulmonary disease, unspecified: Secondary | ICD-10-CM

## 2024-04-04 HISTORY — PX: RIGHT HEART CATH: CATH118263

## 2024-04-04 LAB — POCT I-STAT EG7
Acid-base deficit: 2 mmol/L (ref 0.0–2.0)
Acid-base deficit: 2 mmol/L (ref 0.0–2.0)
Acid-base deficit: 2 mmol/L (ref 0.0–2.0)
Bicarbonate: 21.7 mmol/L (ref 20.0–28.0)
Bicarbonate: 22.1 mmol/L (ref 20.0–28.0)
Bicarbonate: 22.2 mmol/L (ref 20.0–28.0)
Calcium, Ion: 1.12 mmol/L — ABNORMAL LOW (ref 1.15–1.40)
Calcium, Ion: 1.2 mmol/L (ref 1.15–1.40)
Calcium, Ion: 1.22 mmol/L (ref 1.15–1.40)
HCT: 38 % (ref 36.0–46.0)
HCT: 39 % (ref 36.0–46.0)
HCT: 40 % (ref 36.0–46.0)
Hemoglobin: 12.9 g/dL (ref 12.0–15.0)
Hemoglobin: 13.3 g/dL (ref 12.0–15.0)
Hemoglobin: 13.6 g/dL (ref 12.0–15.0)
O2 Saturation: 64 %
O2 Saturation: 71 %
O2 Saturation: 71 %
Potassium: 4.2 mmol/L (ref 3.5–5.1)
Potassium: 4.3 mmol/L (ref 3.5–5.1)
Potassium: 4.4 mmol/L (ref 3.5–5.1)
Sodium: 134 mmol/L — ABNORMAL LOW (ref 135–145)
Sodium: 134 mmol/L — ABNORMAL LOW (ref 135–145)
Sodium: 136 mmol/L (ref 135–145)
TCO2: 23 mmol/L (ref 22–32)
TCO2: 23 mmol/L (ref 22–32)
TCO2: 23 mmol/L (ref 22–32)
pCO2, Ven: 35 mmHg — ABNORMAL LOW (ref 44–60)
pCO2, Ven: 35.7 mmHg — ABNORMAL LOW (ref 44–60)
pCO2, Ven: 36.5 mmHg — ABNORMAL LOW (ref 44–60)
pH, Ven: 7.393 (ref 7.25–7.43)
pH, Ven: 7.4 (ref 7.25–7.43)
pH, Ven: 7.401 (ref 7.25–7.43)
pO2, Ven: 33 mmHg (ref 32–45)
pO2, Ven: 37 mmHg (ref 32–45)
pO2, Ven: 37 mmHg (ref 32–45)

## 2024-04-04 LAB — GLUCOSE, CAPILLARY
Glucose-Capillary: 118 mg/dL — ABNORMAL HIGH (ref 70–99)
Glucose-Capillary: 183 mg/dL — ABNORMAL HIGH (ref 70–99)

## 2024-04-04 SURGERY — RIGHT HEART CATH
Anesthesia: LOCAL

## 2024-04-04 MED ORDER — SODIUM CHLORIDE 0.9% FLUSH: 3.0000 mL | INTRAVENOUS | Status: DC | PRN

## 2024-04-04 MED ORDER — HYDRALAZINE HCL 20 MG/ML IJ SOLN: 10.0000 mg | INTRAMUSCULAR | Status: DC | PRN

## 2024-04-04 MED ORDER — LIDOCAINE HCL (PF) 1 % IJ SOLN
INTRAMUSCULAR | Status: DC | PRN
  Administered 2024-04-04: 2 mL

## 2024-04-04 MED ORDER — SODIUM CHLORIDE 0.9 % IV SOLN: 250.0000 mL | INTRAVENOUS | Status: DC | PRN

## 2024-04-04 MED ORDER — HEPARIN (PORCINE) IN NACL 1000-0.9 UT/500ML-% IV SOLN
INTRAVENOUS | Status: DC | PRN
  Administered 2024-04-04: 1000 mL via INTRAVENOUS

## 2024-04-04 MED ORDER — LABETALOL HCL 5 MG/ML IV SOLN: 10.0000 mg | INTRAVENOUS | Status: DC | PRN

## 2024-04-04 MED ORDER — LIDOCAINE HCL (PF) 1 % IJ SOLN
INTRAMUSCULAR | Status: AC
  Filled 2024-04-04: qty 30

## 2024-04-04 MED ORDER — SODIUM CHLORIDE 0.9% FLUSH: 3.0000 mL | Freq: Two times a day (BID) | INTRAVENOUS | Status: DC

## 2024-04-04 MED ORDER — ONDANSETRON HCL 4 MG/2ML IJ SOLN: 4.0000 mg | Freq: Four times a day (QID) | INTRAMUSCULAR | Status: DC | PRN

## 2024-04-04 MED ORDER — ACETAMINOPHEN 325 MG PO TABS: 650.0000 mg | ORAL_TABLET | ORAL | Status: DC | PRN

## 2024-04-04 MED ORDER — SODIUM CHLORIDE 0.9 % IV SOLN: INTRAVENOUS | Status: DC

## 2024-04-04 SURGICAL SUPPLY — 6 items
CATH BALLN WEDGE 5F 110CM (CATHETERS) IMPLANT
PACK CARDIAC CATHETERIZATION (CUSTOM PROCEDURE TRAY) ×1 IMPLANT
SHEATH GLIDE SLENDER 4/5FR (SHEATH) IMPLANT
TRANSDUCER W/STOPCOCK (MISCELLANEOUS) IMPLANT
TUBING ART PRESS 72 MALE/FEM (TUBING) IMPLANT
WIRE EMERALD 3MM-J .025X260CM (WIRE) IMPLANT

## 2024-04-04 NOTE — Discharge Instructions (Signed)

## 2024-04-04 NOTE — Interval H&P Note (Signed)
 History and Physical Interval Note:  04/04/2024 9:22 AM  Rhonda [name]  has presented today for surgery, with the diagnosis of hp.  The various methods of treatment have been discussed with the patient and family. After consideration of risks, benefits and other options for treatment, the patient has consented to  Procedure(s): RIGHT HEART CATH (N/A) as a surgical intervention.  The patient's history has been reviewed, patient examined, no change in status, stable for surgery.  I have reviewed the patient's chart and labs.  Questions were answered to the patient's satisfaction.     Birl Lobello [employer]

## 2024-04-05 ENCOUNTER — Other Ambulatory Visit (INDEPENDENT_AMBULATORY_CARE_PROVIDER_SITE_OTHER)

## 2024-04-05 ENCOUNTER — Ambulatory Visit

## 2024-04-05 DIAGNOSIS — J849 Interstitial pulmonary disease, unspecified: Secondary | ICD-10-CM

## 2024-04-05 DIAGNOSIS — D849 Immunodeficiency, unspecified: Secondary | ICD-10-CM | POA: Diagnosis not present

## 2024-04-05 DIAGNOSIS — B59 Pneumocystosis: Secondary | ICD-10-CM

## 2024-04-05 LAB — CBC WITH DIFFERENTIAL/PLATELET
Basophils Absolute: 0 10*3/uL (ref 0.0–0.1)
Basophils Relative: 0.4 % (ref 0.0–3.0)
Eosinophils Absolute: 0.2 10*3/uL (ref 0.0–0.7)
Eosinophils Relative: 1.4 % (ref 0.0–5.0)
HCT: 44.5 % (ref 36.0–46.0)
Hemoglobin: 13.9 g/dL (ref 12.0–15.0)
Lymphocytes Relative: 15.5 % (ref 12.0–46.0)
Lymphs Abs: 1.9 10*3/uL (ref 0.7–4.0)
MCHC: 31.1 g/dL (ref 30.0–36.0)
MCV: 84.4 fl (ref 78.0–100.0)
Monocytes Absolute: 0.7 10*3/uL (ref 0.1–1.0)
Monocytes Relative: 5.8 % (ref 3.0–12.0)
Neutro Abs: 9.6 10*3/uL — ABNORMAL HIGH (ref 1.4–7.7)
Neutrophils Relative %: 76.9 % (ref 43.0–77.0)
Platelets: 193 10*3/uL (ref 150.0–400.0)
RBC: 5.28 Mil/uL — ABNORMAL HIGH (ref 3.87–5.11)
RDW: 19.2 % — ABNORMAL HIGH (ref 11.5–15.5)
WBC: 12.5 10*3/uL — ABNORMAL HIGH (ref 4.0–10.5)

## 2024-04-05 LAB — PULMONARY FUNCTION TEST
DL/VA % pred: 78 %
DL/VA: 3.3 ml/min/mmHg/L
DLCO cor % pred: 49 %
DLCO cor: 8.24 ml/min/mmHg
DLCO unc % pred: 49 %
DLCO unc: 8.29 ml/min/mmHg
FEF 25-75 Pre: 2.09 L/s
FEF2575-%Pred-Pre: 145 %
FEV1-%Pred-Pre: 76 %
FEV1-Pre: 1.33 L
FEV1FVC-%Pred-Pre: 116 %
FEV6-%Pred-Pre: 68 %
FEV6-Pre: 1.52 L
FEV6FVC-%Pred-Pre: 105 %
FVC-%Pred-Pre: 64 %
FVC-Pre: 1.52 L
Pre FEV1/FVC ratio: 87 %
Pre FEV6/FVC Ratio: 100 %

## 2024-04-05 LAB — COMPREHENSIVE METABOLIC PANEL WITH GFR
ALT: 33 U/L (ref 0–35)
AST: 22 U/L (ref 0–37)
Albumin: 3.6 g/dL (ref 3.5–5.2)
Alkaline Phosphatase: 39 U/L (ref 39–117)
BUN: 34 mg/dL — ABNORMAL HIGH (ref 6–23)
CO2: 24 meq/L (ref 19–32)
Calcium: 9 mg/dL (ref 8.4–10.5)
Chloride: 104 meq/L (ref 96–112)
Creatinine, Ser: 1.11 mg/dL (ref 0.40–1.20)
GFR: 48.77 mL/min — ABNORMAL LOW (ref >60.00)
Glucose, Bld: 102 mg/dL — ABNORMAL HIGH (ref 70–99)
Potassium: 4.1 meq/L (ref 3.5–5.1)
Sodium: 136 meq/L (ref 135–145)
Total Bilirubin: 0.3 mg/dL (ref 0.2–1.2)
Total Protein: 6.1 g/dL (ref 6.0–8.3)

## 2024-04-05 LAB — MAGNESIUM: Magnesium: 2 mg/dL (ref 1.5–2.5)

## 2024-04-05 LAB — PHOSPHORUS: Phosphorus: 2.8 mg/dL (ref 2.3–4.6)

## 2024-04-05 NOTE — Telephone Encounter (Signed)
 Spoke with the pt  She has already came in and had the labs done  Nothing further needed

## 2024-04-05 NOTE — Patient Instructions (Signed)
Spiro and DLCO performed today. 

## 2024-04-05 NOTE — Progress Notes (Signed)
Spiro and DLCO performed today. 

## 2024-04-06 ENCOUNTER — Ambulatory Visit: Payer: Self-pay | Admitting: Nurse Practitioner

## 2024-04-06 DIAGNOSIS — B370 Candidal stomatitis: Secondary | ICD-10-CM

## 2024-04-06 MED ORDER — NYSTATIN 100000 UNIT/ML MT SUSP
5.0000 mL | Freq: Four times a day (QID) | OROMUCOSAL | 0 refills | Status: AC
Start: 2024-04-06 — End: 2024-04-16

## 2024-04-06 NOTE — Progress Notes (Signed)
 Fungal culture grew out candida, likely contaminant. Sent in nystatin rinses for potential oropharyngeal thrush. Thanks.

## 2024-04-06 NOTE — Progress Notes (Signed)
Spoke with pt and notified of results per Katie.  Pt verbalized understanding and denied any questions. 

## 2024-04-07 ENCOUNTER — Ambulatory Visit: Admitting: Internal Medicine

## 2024-04-07 ENCOUNTER — Encounter

## 2024-04-12 LAB — OPHTHALMOLOGY REPORT-SCANNED

## 2024-04-13 ENCOUNTER — Telehealth: Payer: Self-pay | Admitting: Internal Medicine

## 2024-04-13 ENCOUNTER — Ambulatory Visit: Admitting: Internal Medicine

## 2024-04-13 ENCOUNTER — Encounter: Payer: Self-pay | Admitting: Internal Medicine

## 2024-04-13 VITALS — BP 134/68 | HR 84 | Ht 59.0 in | Wt 144.8 lb

## 2024-04-13 DIAGNOSIS — J679 Hypersensitivity pneumonitis due to unspecified organic dust: Secondary | ICD-10-CM | POA: Diagnosis not present

## 2024-04-13 DIAGNOSIS — J849 Interstitial pulmonary disease, unspecified: Secondary | ICD-10-CM

## 2024-04-13 DIAGNOSIS — Z09 Encounter for follow-up examination after completed treatment for conditions other than malignant neoplasm: Secondary | ICD-10-CM

## 2024-04-13 DIAGNOSIS — J9611 Chronic respiratory failure with hypoxia: Secondary | ICD-10-CM | POA: Diagnosis not present

## 2024-04-13 DIAGNOSIS — B348 Other viral infections of unspecified site: Secondary | ICD-10-CM

## 2024-04-13 DIAGNOSIS — Z8709 Personal history of other diseases of the respiratory system: Secondary | ICD-10-CM

## 2024-04-13 DIAGNOSIS — R5381 Other malaise: Secondary | ICD-10-CM

## 2024-04-13 DIAGNOSIS — D849 Immunodeficiency, unspecified: Secondary | ICD-10-CM

## 2024-04-13 NOTE — Patient Instructions (Addendum)
 ICD-10-CM   1. ILD (interstitial lung disease) (HCC)  J84.9     2. Chronic respiratory failure with hypoxia (HCC)  J96.11     3. Hypersensitivity pneumonitis (HCC)  J67.9     4. Immunosuppression (HCC)  D84.9     5. Physical deconditioning  R53.81     6. Hospital discharge follow-up  Z09     7. Rhinovirus  B34.8     8. History of acute respiratory failure  Z87.09       Much improved but dealing with prednisone  tocixity and post hospitalization deconditioning Glad cardiac catheterization did not show any evidence of pulmonary hypertension although Dr. Hendricks Locker was concerned about right-to-left shunt  P{lan  - Continue prednisone  taper - Good idea of seeing endocrinology Dr Ronelle Coffee -we will send our records to her - get Bubble ECHO - Refer pulmonary rehabilitation - I think we can slowly restart CellCept   =-Going to send a message to our pharmacy team - Continue oxygen  use for pulse ox needs greater than 88% - Make sure you keep up with [facility] lung transplant clinic - At follow-up we will consider any emerging treatment options such as JACSYD  Follow-up - 8-10 weeks with Dr. Bertrum Brodie 30-minute visit but after pulmonary function testing

## 2024-04-13 NOTE — Progress Notes (Signed)
 2012 and 2013  #GE reflux with small hiatal hernia  - on ppi   #History of rapid heart rate not otherwise specified  - Start her on Lopressor   2008/2009 by primary care physician. History appears to correlate with onset of respiratory issues  - refuses to dc this drug as of 2014 discussion   # Hypersensitivity Pneumonitis and ILD  - Potential etiologies - cockateil x 2 for 18 years through end 2012. 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 Denies mold but lives in house built in [place] with a "weird baselment: and has humidifier  - first noted on CT chest 10/15/09 following trip to [place] (PE negative) but not described in 2003 CT chest report  - autoimmune panel: 10/23/11: Negative  -  Uderwennt bronch 11/19/11  - non diagnostic  - VATS Nov 2013 (done after initially refusing)- CRONIC HYPERSENSITIVITY PNEUMONITIS WITH GRANULOMA. However, there is worrying trend of UIP pattern in the Upper lobes.         OV 06/17/2017  Chief Complaint  Patient presents with   Follow-up    [name]. Pt denies significant cough, denies CP/tightness, f/c/s.     She is better. Feels she does not need o2. Has been to [facility] for transplant clinic; felt too early. Seems higher dose prednisone  helping but then she is also bettter t his time of the year. In May 2018 I was at [facility] and curbsided national thought leaders - feel that we could explore silent active GERD as a possibility of ILD getting wors with time.   OV 01/11/2018  Chief Complaint  Patient presents with   Follow-up    PFT done today.  Pt states she has been coughing x2 weeks since she returned from a trip to [place] . From coughing, pt has had pain in ribs. Pt states she has mild SOB which is stable due to rehab.  Pt states that Dr. Kurt Phi is wanting to know if pt could possibly have eosinophilic esophagitis.   Went to [place] . Returned and  has been coughing x 2 weeks. Associated with this is some rib pain on right side. She feels she does not need o2. She feels she does not need pred/abx at this time. She also exposed to flu wit family   OV 02/08/2018  Chief Complaint  Patient presents with   Acute Visit    CXR done 02/07/18.  Pt states her throat is sore but not as bad as it was yesterday when in office and does have some left ear pain. Pt states she has some pain on right rib cage and has increased fatigue.     Ms. Montecalvo presents acutely.  I last saw her one month ago gave her Tamiflu  for prophylaxis after flu exposure.  At that point in time she was already reducing her chronic stable prednisone  of 10 mg for interstitial lung disease/hypersensitivity pneumonitis.  She had tapered herself slowly 3 or 4 weeks ago to 5 mg.  She says she was doing fairly okay but in the last 2 or 3 weeks she has had increased fatigue.  She says she goes daily swimming and then feels energized but then an hour later starts having fatigue which is more than usual.  Also in the daytime she has random fatigue.  In terms of her respiratory symptoms these are stable and in fact slightly better in terms of shortness  of breath and cough but she is having sore throat without any fever and some left-sided otalgia.  In addition she is having right-sided chest pains that are more than usual.  The chest pains have been reported before.  These are specifically at localized spots along the previous incision several years ago for interstitial lung disease.  They are tender as well to touch.  This pain is worse.  In addition she is also being bothered by arthralgia in her hand and left hip.  The left hip arthralgias old with a hand arthralgias new.  She is worried about pneumothorax recurrence and so she had a chest x-ray yesterday that shows no pneumothorax.  She has stable interstitial lung disease changes but in my personal visualization these interstitial lung disease  changes are worse compared to several years ago.  We do know that she has slowly worsening interstitial lung disease.  In terms of a walking desaturation test this shows stability since last visit.  She has seen GI for acid reflux and has pH probe study coming up  In talking to her she tells me she has been on metformin  for over a month or 2.  This is new and is meant for pre-diabetes.  Apparently her hemoglobin A1c is always below 6 but is above 5.5.  Walking desaturation test on 02/08/2018 185 feet x 3 laps on ROOM AIR:  did walk normal pace with forehead probe desaturate. Rest pulse ox was 99%, final pulse ox was 93%. HR response 72/min at rest to 93/min at peak exertion. Patient DIAMONE WHISTLER  Did not Desaturate < 88% . Almyra Arn yes did  Desaturated </= 3% points. Almyra Arn yes did get tachyardic   Dg Chest 2 View  Result Date: 02/07/2018 CLINICAL DATA:  Chest wall pain for 2 weeks EXAM: CHEST - 2 VIEW COMPARISON:  CT 08/20/2017, radiograph 10/11/2015 FINDINGS: Coarse interstitial pattern consistent with pulmonary fibrosis, similar distribution compared to prior. No focal pulmonary opacity or pleural effusion. Stable cardiomediastinal silhouette with aortic atherosclerosis. No pneumothorax. Degenerative changes of the spine. IMPRESSION: No active cardiopulmonary disease. Similar appearance of diffuse pulmonary fibrosis. Negative for a pneumothorax. Electronically Signed   By: Esmeralda Hedge M.D.   On: 02/07/2018 14:26    OV 04/13/2018  Chief Complaint  Patient presents with   Follow-up    Pt had pre spiro and DLCO PFT prior to OV. Pt has increase of productive cough-thick white in last week. Pt has some wheezing, and allergy issues.   Ms. Johnell Na presents for routine follow-up.  I saw her acutely just approximately 2 months ago.  Then end of April 2019 she saw a nurse practitioner again acutely and was given antibiotic and prednisone .  She says she felt better after that but  in the last week or so due to increased pollen load in the community and also moving furniture because her daughter is relocating out of her home she started having more cough.  There is no change in her dyspnea with the cough is really bad.  This is despite Singulair  and Chlor-Trimeton.  Those things to help but just take the edge off.  She feels that the pollen making the cough worse.  There is no change in dyspnea.  Walking desaturation test documented below his baseline.  She had pulmonary function test today but the [facility] shows significant decline compared to February 2019 and she feels surprised by this.  DLCO is baseline unchanged and a walking desaturation  test limited in the office is also unchanged.  She does not have any fever but has significant postnasal drip.  She has not followed up at [facility] transplant clinic or the allergist recently.  OV 08/16/2018  Subjective:  Patient ID: Almyra Arn, female , DOB: Mar 08, 1951 , age 54 y.o. , MRN: [id number] , ADDRESS: [address] [place] [address]   08/16/2018 -   Chief Complaint  Patient presents with   Follow-up    PFT performed today.  Pt states she has had a lot of mucus the month of September 2019 so she has been taking the Chlor-Trimeton. States other than that she has been able to do a lot more activities and has been swimming a lot more. Pt states she believes the SOB is stable.     HPI JAYME MEDNICK 73 y.o. -continues to do well.  She is taking higher dose of prednisone  10 mg/day.  Also the summer usually she is doing well.  She is not having much of a cough.  Lung function test shows improvement compared to tests done earlier this year.  In fact she is almost as good as much 2018.  Still compared to 2014-2016 she has progressive lung disease.  Walking desaturation test is stable.  She needs a high-dose flu shot but we do not have stock today.  She has lost some weight intentionally with better dietary control.  She  is exercising regularly swimming.  We discussed ofev   potential future therapy if the drug is approved for this indication.  We will know study results in a year or so.  She is open to the idea but is worried somewhat about her irritable bowel syndrome flaring up.  She is no longer following at the [facility] lung transplant clinic given stability lung function  Other issues: She always has right-sided chest wall pain to the site of lung biopsy.  The only way this is been resolved as by her limiting her use of bra .  Also her irritable bowel syndrome is under control but in case it flares up she wants a refill of dicyclomine      OV 12/15/2018  Subjective:  Patient ID: Almyra Arn, female , DOB: September 02, 1951 , age 50 y.o. , MRN: [id number] , ADDRESS: [address] [place] [address]   12/15/2018 -   Chief Complaint  Patient presents with   Follow-up    Pt states she has been doing better since last visit with TP but states she has been having pain in her rib cage x2 years but states it has become more intense.     HPI EMELDA KOHLBECK 73 y.o. -returns for follow-up of her ILD due to hypersensitivity pneumonitis.  She did a clinic visit also for research with the ILD-pro registry study.  Since I last saw her in September 2019 she had a flareup and required higher doses of prednisone .  Since then she is returned to baseline of 5 mg prednisone  and she feels good.  She is stable.  Walking desaturation test shows stability.  We have been discussing over the last few months about taking nintedanib based on new data and progressive non--IPF ILD's.  She has trepidation for this because of irritable bowel syndrome.  After much discussion she is willing to try 100 mg once daily for a month and then escalate to 100 mg twice daily which is the therapeutic dose.  She will do this once after insurance approval which  we suspect will happen over the next few months.  Her main issue is that her right  sided infra-axillary chest pain is getting worse.  This started after her surgical lung biopsy and pneumothorax on the right side.  It is random and happens with twisting.  But now days it happens once every few days.  Previously it used to happen once every few weeks.  Sometimes it is excruciating but then she is left in pain for a few days.  Applying Abrol sudden twisting of her body makes it worse.  Is a lancinating pain.  She did try gabapentin  some years ago for chronic cough .  She did tolerate the gabapentin  well.  She does not remember if it helped the pain or not but clearly back and the pain was much less intense.  She is willing to try this again.  I recommended a CT scan of the chest but she is going to have radiation for a CT coronary angiogram because of high levels of coronary artery calcification.  Therefore written to the radiologist to see if he can look at the lung parenchyma with a CT angiogram    OV 05/09/2019  Subjective:  Patient ID: Almyra Arn, female , DOB: Apr 22, 1951 , age 89 y.o. , MRN: [id number] , ADDRESS: [address] [place] [address]   05/09/2019 -   Chief Complaint  Patient presents with   ILD (Interstitial Lung Disease)     HPI RIANNON MUKHERJEE 73 y.o. -follow-up for chronic care physician pneumonitis on daily 5 mg prednisone .  She has problems with right-sided chest wall pain that is neuropathic.  She tells me since her last visit January 2020 she has been isolating and social distancing because of the pandemic.  Overall she is been stable.  Her symptom scores are mild and documented below.  She continues with prednisone .  She was supposed to have pulmonary function test but this was held because of the pandemic.  Her walking desaturation test shows she is stable.  She is supposed to have pulmonary function test tomorrow but wanted to see me today.  In the interim she did have a cardiac CT scan of the chest that shows 94th percentile of coronary calcium .   She could not get a CT angiogram done because of PVC.  A nuclear medicine stress test was done and I reviewed this result and it was normal.  A Holter test is pending.  She is kind of nervous because of the ongoing COVID-19 situation and safety of doing a Holter test.  In terms of her right chest wall pain she is adapted to it.  She does not use any bra  OV 08/22/2019  Subjective:  Patient ID: Almyra Arn, female , DOB: 09/06/1951 , age 56 y.o. , MRN: [id number] , ADDRESS: [address] [place] [address]   08/22/2019 -   Chief Complaint  Patient presents with   Follow-up    needs sx clearance for L hip replacement- not yet scheduled, pending clearance.     Patient lung disease chronic hypersensitive pneumonitis on chronic daily prednisone  5 mg/day  HPI CHANTILLE NAVARRETE 73 y.o. -presents for follow-up of her interstitial lung disease due to chronic hypersensitive pneumonitis.  After last visit in June 2020 given her PFT stability and also her aversion to the potential side effects with nintedanib she decided to just continue with prednisone  daily 5 mg/day.  Given the pandemic she is walking 1 mile a day without  stopping.  Her interstitial lung disease symptom score is actually slightly better.  Overall she reports stability in her interstitial lung disease.  She has a new issue of getting preoperative clearance for her left hip.  She says the left hip is bone-on-bone and she does not want to take Aleve.  She prefers to have surgery.  Dr. Claiborne Crew is the surgeon.  Surgery date has not been set.  She is very functional.  She has normal renal function.  Normal nutritional status.  No anemia.  No respiratory infections in the last 1 month.  She is not on oxygen .  Age 60.  The surgeries in the left hip and the duration of surgery is under 3 hours.  Anesthesia will be general.      OV 01/30/2020  Subjective:  Patient ID: Almyra Arn, female , DOB: 04-05-1951 , age 66 y.o. , MRN:  [id number] , ADDRESS: [address] [place] [address] Patient lung disease chronic hypersensitive pneumonitis on chronic daily prednisone  5 mg/day  01/30/2020 -   Chief Complaint  Patient presents with   Follow-up    PFT performed 3/8.  Pt states she believes she has been going downhill for the past 2 months. Pt was started on O2 at last OV and wears it prn. Pt does have an occ cough.     HPI DESHAWN SKELLEY 73 y.o. -presents for follow-up of interstitial lung disease secondary to chronic hypersensitive pneumonitis.  She maintains herself on prednisone  5 mg/day.  In the interim she has had hip surgery and this did wonders for her.  She is able to walk longer distances.  However she does notice that her dyspnea has declined.  She says since January 2021 his symptoms have declined particularly with cough.  Usually in the winter she goes through a cycle of severe cough and exacerbation that requires higher levels of prednisone .  She has been social distancing well and masking well.  She states there is absolutely no mold or water  any antigen exposure at home.  But she feels it is her allergies acting up and making her more symptomatic.  She has had a Covid vaccine and is wondering about either going back to swimming or starting pulmonary rehabilitation.  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  In fact when we walked her today with the mask she did drop down to 89%.  This is a decline for her.  She had pulmonary function test and this shows significant decline in FVC and DLCO.  In review of this that.  Where she has declined this much but is always bounce back with steroids.  At last visit we discussed nintedanib for her as a way to prevent progression of her ILD but given her concerns of GI side effects and the presence of irritable bowel syndrome she is declined.  Infective have this conversation with her  including her joining the [employer] at [employer] but she has always been worried about the GI side effects with nintedanib.  This time she is more open to it because of the decline in lung function.  She wants to see an allergist  She also told me that she continues to have a restrictive chest pain particularly on the right side lower area.  She no longer wears a brassiere it has been nearly 3 years since she had a high-resolution CT chest.  She is open to having one now.  Last liver function test was normal in January 2020.  Last renal function was normal in November 2020.      OV 03/19/2020  Subjective:  Patient ID: Almyra Arn, female , DOB: 07-25-1951 , age 14 y.o. , MRN: [id number] , ADDRESS: [address] [place] [address]  Chronic hypersensitive pneumonitis interstitial lung disease on prednisone  daily 10 mg 03/19/2020 -   Chief Complaint  Patient presents with   Follow-up    Pt states she is doing better since last visit and states her breathing has also improved.     HPI LAMISHA ROUSSELL 73 y.o. -at last visit in March 2021 there was decline in lung function.  Therefore we started nintedanib.  She took 1 tablet of 150 mg and then had immediate abdominal pain and fever.  Therefore she is decided against taking this.  I also support this decision because I do not think she will tolerate nintedanib.  We did extensive interstitial lung disease question a history again at that time.  She had taken 1 dose of nitrofurantoin.  Have indicated to her that she should never take this medicine again.  In addition discovered that she might have some feather jackets at home she has now gotten rid of it.  She is now doing pulmonary rehabilitation.  Her overnight oxygen  desaturation test is negative.  At pulmonary rehabilitation she tells me she does not drop below 91% and 1 time she went to 89%.  Otherwise overall doing great.  Her cardiologist is increased her Lopressor .  Her daughter is getting  married and she is busy with the wedding planning this is being emotionally stressful for her.  Her allergist has increased her recommendations and this is also helping her.  Her allergist is retiring and she wanted recommendations for new allergist.  I have recommended Dr. Oscar Blazing and Dr. Phill Brazil.  Incidentally that right-sided chest pain that she had is also better with increased prednisone  of 10 mg/day.  She feels prednisone  is working really well for her.    OV 08/29/2020   Subjective:  Patient ID: Almyra Arn, female , DOB: 11/09/1951, age 60 y.o. years. , MRN: [id number],  ADDRESS: [address] [place] [address] PCP  Allene Ivan, MD Providers : Treatment Team:  Attending Provider: Maire Scot, MD Patient Care Team: Allene Ivan, MD as PCP - General (Family Medicine) Eilleen Grates, MD as PCP - Cardiology (Cardiology) Maire Scot, MD as Consulting Physician (Pulmonary Disease) Danise Durie, Craige Dixon, MD (Inactive) (Cardiology) Dorothe Gaster, RD as Dietitian (Family Medicine) Eilleen Grates, MD as Consulting Physician (Cardiology)  Follow-up chronic hypersensitive pneumonitis on chronic prednisone  10 mg/day.  Last high-resolution CT chest March 2021.  Intolerant to nintedanib.  Chief Complaint  Patient presents with   Follow-up    Pt states she had been doing well since last visit up until 3-4 weeks ago and started having problems with her breathing and has had to use her O2 with activities. Pt also has been coughing a lot and is getting up clear phlegm. Pt also has had some mild wheeze.       HPI JANANN BOEVE 73 y.o. -returns for follow-up.  Since I last saw her her daughter is now married.  She had a great wedding.  She was able to enjoy this and dance quite well.  She barely used her oxygen  her effort tolerance was good.  Some 3 weeks ago [place]  Apparently she had difficulty getting her oxygen  tank  to clinic to the electrical port.  She noticed with exertion a pulse ox dropping to the 70s.  She said even before she left a few days prior to that she started having increasing cough.  She notices that these cough cycles get exacerbated in the fall in the winter.  Looking back at her pulmonary function test there is always a dip between October and March each year.  Between 2015 and 2017 there was a decline and since then there is a waxing and waning quality with dips in the winter in the fall season.  Each time she responds to prednisone .  Most recent pulmonary function test in August 2021 is actually improved compared to March 2021.  However today she is feeling worse.  She feels something is in her airway.  She feels dry air in the house and the animals bringing leaves from outside is contributing.  She says she feels better upstairs than downstairs where the dogs.  She again denies any mold or mildew.  Denies any feather jacket exposure.  She feels she will benefit from another round of prednisone .  She feels in the past antibiotics have not helped her as much as prednisone .   In terms of chronic ILD HP: She not tolerate nintedanib in the past.  I explained to her that I am concerned about progression and her decline in functional quality and the risk for that.  We discussed pirfenidone  as an alternative.  Discussed the fact is less well studied although have no reason to see why it would not be effective.  Explained the side effect profile.  She is willing to try this.  We decided to go donor samples from the patient support group  Oxygen  qualification: We walked her and she qualified for oxygen  today.  Is for portable oxygen  with [employer]  Other issue: Spring allergies: -Her allergist is retired.  We discussed options of referral.  Referred her to Dr. Chanetta Comes referred to     OV 01/14/2021  Subjective:  Patient ID: Almyra Arn, female , DOB: 29-Sep-1951 , age 89 y.o. , MRN: [id number] ,  ADDRESS: [address] [place] [address] PCP Allene Ivan, MD Patient Care Team: Allene Ivan, MD as PCP - General (Family Medicine) Eilleen Grates, MD as PCP - Cardiology (Cardiology) Maire Scot, MD as Consulting Physician (Pulmonary Disease) Danise Durie, Craige Dixon, MD (Inactive) (Cardiology) Dorothe Gaster, RD as Dietitian (Family Medicine) Eilleen Grates, MD as Consulting Physician (Cardiology)  This Provider for this visit: Treatment Team:  Attending Provider: Maire Scot, MD    01/14/2021 -   Chief Complaint  Patient presents with   Follow-up    Nauseous, loss of appetite    Follow-up chronic hypersensitive pneumonitis on prednisone  5 mg/day and also pirfenidone  submaximal dose of 2 pills 3 times daily since October 2021.  On ILD-pro registry protocol  - failed ofev    - failed esbrit due to side effects Wanita Gutta 2022  She is also on Breo  HPI BRECKEN WALTH 73 y.o. -returns for follow-up.  She is now on pirfenidone  along with low-dose prednisone .  She feels the pirfenidone  is not reacting well with her at all.  She has intermittent loss of taste at least twice a week.  She has loss of appetite and she has forces herself to eat.  She also has fatigue.  She says  that if I strongly recommended then she will continue to soldier on with it.  Otherwise she feels less short of breath.  Pulmonary function test reviewed and is actually stable/slightly improved.  She says the cough is extremely well controlled except today after the pulmonary function test she was coughing quite a bit.  She is looking forward to going back to swimming.  She has had a Covid vaccine in booster and also status post EVUSHELD monoclonal antibody prophylaxis.  We discussed the possibility about possibility of enrolling in inhaled nitric oxide  device/administration study.  This is to see if people can improve in the shortness of breath scale and also physical activity.  She took the consent form and  is deliberating.  She thinks she might find the study and inconvenience to her lifestyle where she is wanting to be more active.  We went over several details of this research protocol  She has a new symptom of like itching sensation deep in the right flank.  She feels it is not the skin it is from deep inside.  She is wondering if it could be related to pirfenidone .  Started after going on pirfenidone .  Explained to her that I am not seeing this in other patients with pirfenidone  but will have to be open minded.  She does not apply Vaseline to her skin in the winter.  We discussed the possibility this could be dry skin related but she does not have other features of dry skin.  Nevertheless she is willing to try Vaseline.   Results for CHAPP   OV 07/08/2021  Subjective:  Patient ID: Almyra Arn, female , DOB: 11-16-1951 , age 56 y.o. , MRN: [id number] , ADDRESS: [address] [place] [employer] [place] [address] PCP Allene Ivan, MD Patient Care Team: Allene Ivan, MD as PCP - General (Family Medicine) Eilleen Grates, MD as PCP - Cardiology (Cardiology) Maire Scot, MD as Consulting Physician (Pulmonary Disease) Danise Durie, Craige Dixon, MD (Inactive) (Cardiology) Dorothe Gaster, RD as Dietitian (Family Medicine) Eilleen Grates, MD as Consulting Physician (Cardiology)  This Provider for this visit: Treatment Team:  Attending Provider: Maire Scot, MD    07/08/2021 -  ACUTE VISIT Chief Complaint  Patient presents with   Acute Visit    Pt states that she did start taking zpak yesterday 8/15 and said her cough is doing some better after being on second day of abx and said that she started prednisone  today 8/16. States that she still has increased SOB. Pt did take a covid test which came back negative.   Follow-up chronic hypersensitive pneumonitis on prednisone  5 mg/day and also pirfenidone  submaximal dose of 2 pills 3 times daily since October 2021.  On ILD-pro registry protocol -  failed ofev    - failed esbrit due to side effects Wanita Gutta 2022  She is also on Breo   HPI ESBEYDI MANAGO 73 y.o. -this is an acute visit.  Last seen in February 2022 after that she was supposed to see me back in 3 months.  But this visit has been scheduled acutely.  She tells me that early in May 2022 she went to [facility].  They rented a house.  She believes that the bedroom had some mold.  She did not visually confirmed the mold but husband might have.  She says shortly after going there and spending a week she started having worsening cough with hoarseness of voice.  This cough is persisted all along and the hoarseness has  persisted as well.  The in June 2022 she had a short course of steroids that did not help.  She believes the dosing was not strong enough.  In July 2022 she had some back issues and was given 30 mg of prednisone  for approximately 10 days with a taper and this helped her back.  But all along the cough is persisted and severe hoarseness is persisted.  Then approximately a week ago she started getting shortness of breath along with yellow sputum.  The cough got worse .  She states even at baseline she brings her a lot of white sputum and has to cough a lot.  [place] and a couple of days into the illness.  At this point in time she started noticing she was more easily desaturating.  She is not able to swim.  She says walking to the car her pulse ox dropped to 84%.  Then the yellow phlegm started getting worse.  She called in yesterday and we prescribed Z-Pak and a prednisone  burst.  She says now with the Z-Pak the sputum color is improving.  A week ago she also saw Dr. Hildy Lowers for chronic cough.  100 mg 3 times daily of gabapentin  has been prescribed.  She is slowly increasing it.  She is worried about the side effects of this drug.  Apparently her vocal cord had ulcers from repeated coughing.  ILD symptom score shows worsening.  In the office  she is very hoarse   Of note she stopped her pirfenidone  in February/March 2022 because of side effects - gerd  She uses oxygen  with exertion.  There is no leg swelling or hemoptysis.  Started z pak yesterday Started 12d pred taper yesterday     PFT   OV 08/14/2021  Subjective:  Patient ID: Almyra Arn, female , DOB: May 17, 1951 , age 60 y.o. , MRN: [id number] , ADDRESS: [address] [place] [address] PCP Allene Ivan, MD Patient Care Team: Allene Ivan, MD as PCP - General (Family Medicine) Eilleen Grates, MD as PCP - Cardiology (Cardiology) Maire Scot, MD as Consulting Physician (Pulmonary Disease) Danise Durie, Craige Dixon, MD (Inactive) (Cardiology) Dorothe Gaster, RD as Dietitian (Family Medicine) Eilleen Grates, MD as Consulting Physician (Cardiology)  This Provider for this visit: Treatment Team:  Attending Provider: Maire Scot, MD  Follow-up chronic hypersensitive pneumonitis on prednisone  5 mg/day and also pirfenidone  submaximal dose of 2 pills 3 times daily since October 2021.  On ILD-pro registry protocol - failed ofev    - failed esbrit due to side effects Wanita Gutta 2022  She is also on Breo  08/14/2021 -   Chief Complaint  Patient presents with   Follow-up    Pt states she is feeling better since last visit. States she did receive her lighterweight POC which has been working well for her.     HPI MAKYLA BYE 73 y.o. -seen last month with worsening symptoms acute bronchitis/flare.  Given Z-Pak and prednisone .  She is significantly better.  She tells me that even though she is better and his subjective symptom score is better below.  She still feels not back to her May 2022 baseline.  She says in the neighborhood she has to use more oxygen .  She is interested in a backpack for oxygen .  She is not able to swim as well as she used to.  She says acid reflux is under better control now after seeing  Dr. Hildy Lowers in E[place].  She is wondering  about starting pirfenidone  she definitely does not want to do nintedanib again.  But even with pirfenidone  she is reluctant.  We discussed CellCept  because of progression but because of the immunosuppression she is reluctant.  We resolved that she would increase the prednisone  to 10 mg/day and reassess.  She was supposed to have done a spirometry today but it did not happen.  She will have it done again in 4 weeks.  Have requested a schedule for that.    The main concern right now even though is better is that she is not to her baseline as of a year ago and she feels like she is having slowly progressive disease interstitial lung disease/chronic HP.       OV 10/03/2021  Subjective:  Patient ID: Almyra Arn, female , DOB: Apr 12, 1951 , age 23 y.o. , MRN: [id number] , AD[address]ESS: [address] [place] [address] PCP Allene Ivan, MD Patient Care Team: Allene Ivan, MD as PCP - General (Family Medicine) Eilleen Grates, MD as PCP - Cardiology (Cardiology) Maire Scot, MD as Consulting Physician (Pulmonary Disease) Danise Durie, Craige Dixon, MD (Inactive) (Cardiology) Dorothe Gaster, RD as Dietitian (Family Medicine) Eilleen Grates, MD as Consulting Physician (Cardiology)  This Provider for this visit: Treatment Team:  Attending Provider: Maire Scot, MD    10/03/2021 -   Chief Complaint  Patient presents with   Follow-up    Pt states her breathing has become worse since last visit. States she is having to use her O2 almost all the time now with exertion.    Follow-up chronic hypersensitive pneumonitis on prednisone  5 mg/day and also pirfenidone  submaximal dose of 2 pills 3 times daily since October 2021.  On ILD-pro registry protocol - failed ofev  due to side efect  - failed esbrit due to sde effects marvh 2022  -Last echo 2018  -Last high-res CT March 2021. -> Nov 2022 stable between 2  She is also on Breo  HPI MARIN WISNER 73 y.o. -returns for  follow-up.  In this visit she is categorically saying that she is significantly worse.  This point in particular relationship to dyspnea on exertion.  Her weight itself is stable.  She is currently on 5 mg prednisone  per day [last time I thought I increased it to 10 mg/day].  Nevertheless she says other interim changes is that she has had slow weight gain.  BMI is 30.  She started semaglutide for hemoglobin A 1.C 6.5.  Is also to help weight.  She was also after seeing Dr. Hildy Lowers for her cough and significant amount of anti-PPI therapy.  She said this made her diarrhea worse.  She then reduced it and currently is taking omeprazole  20 mg/day in the daytime and 40 mg at night and in between Pepcid  as well.  She stopped the gabapentin .  She feels after reducing the PPI the diarrhea is improved.  The cough is not as bad as it was even after stopping gabapentin  but she continues to be fatigued.  She complains of bloating.  She feels her stomach is full.  She feels acid reflux is worse.  Acid reflux is worse after reducing the dose of H2 blockade.  She also has early satiety.  She also states when she coughs she vomits.  She feels she needs to see Dr. Rebekah Canada.  I have messaged him and he hasagreed for him or his PA to see her soon.  Nevertheless symptoms are significantly worse as seen on the symptom score below.  She is also reporting increased tachycardia with exertion and apparently Dr. Lavonne Prairie adjusted her beta-blocker.  She has upcoming appointment with them.    She says that at times when she desaturates easily at home even for minimal exertion.  We walked her today with a forehead probe and she actually did much better than expected going down to 89% only at the end of 3 labs [this is actually an improvement from the recent past versus stability].  She might have an erratic finger pulse ox probe at home.   Her pulmonary function shows a significant decline in DLCO.  Last echocardiogram 2018.  Last  high-resolution CT chest March 2021.  Never had right heart catheterization.  Hemoglobin normal 14.7 g% in August 2022.           OV 10/21/2021  Subjective:  Patient ID: Almyra Arn, female , DOB: 1951-01-20 , age 89 y.o. , MRN: [id number] , ADDRESS: [address] [place] [address] PCP Allene Ivan, MD Patient Care Team: Allene Ivan, MD as PCP - General (Family Medicine) Eilleen Grates, MD as PCP - Cardiology (Cardiology) Maire Scot, MD as Consulting Physician (Pulmonary Disease) Danise Durie, Craige Dixon, MD (Inactive) (Cardiology) Dorothe Gaster, RD as Dietitian (Family Medicine) Eilleen Grates, MD as Consulting Physician (Cardiology)  This Provider for this visit: Treatment Team:  Attending Provider: Maire Scot, MD  Type of visit: Video Circumstance: COVID-19 national emergency Identification of patient CYANN VENTI with 10-13-1951 and MRN [id number] - 2 person identifier Risks: Risks, benefits, limitations of telephone visit explained. Patient understood and verbalized agreement to proceed Anyone else on call: no Patient location:  her home This provider location: [address], [address]; [place]; [place] [address]. [facility]. [phone]    10/21/2021 -     Follow-up chronic hypersensitive pneumonitis on prednisone  5 mg/day and also pirfenidone  submaximal dose of 2 pills 3 times daily since October 2021.  On ILD-pro registry protocol - failed ofev  due to side efect  - failed esbrit due to sde effects marvh 2022  -Last echo 2018  -Last high-res CT March 2021.  She is also on Breo HPI ALLESANDRA HUEBSCH 73 y.o. -returns for follow-up via video visit to discuss test results.  She says currently she is just doing PPI lower dose 20 mg omeprazole  in the daytime and famotidine  at night.  With this her diarrhea is better.  Also bloating is better.  Correlating with this for she feels her lungs are stable.  She does say  that even now when she bends down she can desaturate.  She is not using oxygen  at night.  Her primary care is testing her over no at night to see if she really needs it at night.  Nevertheless she feels somewhat better.  She had echocardiogram because of concern of pulmonary hypertension and there is no evidence of pulmonary hypertension.  She had high-resolution CT chest which the radiologist was described as minimally progressive since 18 months earlier in March 2021.  She had pulmonary function test that shows the FVC to be stable in the last few to several months but the DLCO to show slight decline.  Nevertheless the pattern at least compared to 2 years ago is 1 of progressive ILD on the pulmonary function test.  Certainly the hypoxia with easy exertion compared to a few years ago or a year ago is also  a sign that her ILD is getting worse.  She is likely stable or stabilized in the last few to several months.  We discussed going challenge again with nintedanib or pirfenidone  but she is not interested.  We discussed doing CellCept  and discussed the side effect profile of this.  She is not interested.  We discussed transplant referral for evaluation now that she is progressing so she can get plugged in and have a good conversation.  She is reflected on this from the last visit and she is not interested  We discussed the acid reflux controlled she feels she is at a good place right now with good acid reflux controlled with PPI in the morning and H2 blockade at night.  She has seen GI and at this point I believe they are not going to do pH probe.  I reviewed the note.  She continues on Breo and prednisone  5 mg/day which she plans to continue.  She has heard about the promedior trial with IV Pnetraxin for IPF.  This might be a study for non-- IPF progressive phenotype.  She prefers to do the study because it bypasses the GI route and it is an IV study.  We do not know if the study is going to happen.  We do  not know if you are going to be a site.  If these things all get aligned then maybe we could consider that.        OV 07/21/2022  Subjective:  Patient ID: Almyra Arn, female , DOB: Jul 23, 1951 , age 45 y.o. , MRN: [id number] , ADDRESS: [address] [place] [address] PCP Allene Ivan, MD Patient Care Team: Allene Ivan, MD as PCP - General (Family Medicine) Eilleen Grates, MD as PCP - Cardiology (Cardiology) Maire Scot, MD as Consulting Physician (Pulmonary Disease) Danise Durie, Craige Dixon, MD (Inactive) (Cardiology) Dorothe Gaster, RD as Dietitian (Family Medicine) Eilleen Grates, MD as Consulting Physician (Cardiology)  This Provider for this visit: Treatment Team:  Attending Provider: Maire Scot, MD  She is also on [facility]  07/21/2022 -   Chief Complaint  Patient presents with   Follow-up    PFT performed today.  Pt states she has been doing good since last visit.     HPI ARHIANNA EBEY 73 y.o. -returns for follow-up.  Overall in the summer months she is doing well.  She went to the [facility] [place]  in [place] .  In the areas where there was a lot of [place] and clean air she was able to walk down the beach 200 steps and including climb up with the oxygen  without desaturation and feeling good.  But when she got to [facility] where there was pollution she started having cough.  She currently feels really good.  She feels well symptom scores are improved.  Walking desaturation test is also improved.  She attributes this to some weight loss and overall healthy living.  She is on Ozempic that is helping her lose weight.  She is swimming.  She does have some cough recently for the last week because of allergies.  Her dog Nancey Awkward passed away.  Therefore she is taking care of the other dog and this is increasing her allergies and cough but still overall symptoms are improved.  She continues on prednisone  5 mg/day.  Currently she not  interested in any prednisone  burst.  Social: She is expecting to be a grandmother in a few months.  OV 10/27/2022  Subjective:  Patient ID: Almyra Arn, female , DOB: 05-27-1951 , age 66 y.o. , MRN: [id number] , ADDRESS: [address] [place] [address] PCP Allene Ivan, MD Patient Care Team: Allene Ivan, MD as PCP - General (Family Medicine) Eilleen Grates, MD as PCP - Cardiology (Cardiology) Maire Scot, MD as Consulting Physician (Pulmonary Disease) Danise Durie, Craige Dixon, MD (Inactive) (Cardiology) Dorothe Gaster, RD as Dietitian (Family Medicine) Eilleen Grates, MD as Consulting Physician (Cardiology)  This Provider for this visit: Treatment Team:  Attending Provider: Maire Scot, MD    10/27/2022 -   Chief Complaint  Patient presents with   Acute Visit    Pt has been having complaints of increased SOB and feels like she is having a flare up. Pt states she took zpak and prednisone  about a month ago. States she has had some desats in oxygen  levels.     HPI AHTZIRI JEFFRIES 73 y.o. -presents for follow-up.  This is an acute visit.  She is now a grandmother.  The granddaughter's name is Kimball Pence.  She is pretty excited about that.  However she is having deterioration in her symptoms.  She says when she saw me in August 2023 she was actually quite well but she thought looking back she might of started with a cough.  She did travel west but then starting September 2023 she started having worsening cough which typically happens in the fall with the leaves get dry.  I prescribed 8-10-day prednisone  burst along with Z-Pak.  She states the prednisone  always helps her.  However she feels that the prednisone  I prescribed was not probably strong enough.  Since then she is having a deterioration in the cough.  She also feels more fatigued.  There is white sputum coming with this.  She is also having worsening shortness of breath for the last 2 or 3 weeks.  She  feels this time the shortness of breath is different and is the more dominant symptoms.  Usually cough is the more dominant symptoms.  The deterioration in shortness of breath really suddenly in the last few weeks.  There are some associated exertional chest pain.  She did see Dr. Lavonne Prairie recently.  I reviewed his note.  She has had a previous normal stress test so he felt reassured this was not cardiac.  He did contemplate stopping the metoprolol  given her "reactive airway disease" [she has hypersensitivity pneumonitis and can occasionally wheeze but I did leave her on metoprolol  being a better once because of the beneficial effects on her heart rate with exertion].  Her symptom score showed deterioration  Her walking desaturation test also shows significant deterioration.  Similar to September 2022 but a lot worse.  This the worst its ever been.  There is no fever     OV 12/10/2022  Subjective:  Patient ID: Almyra Arn, female , DOB: [birth year] , age 59 y.o. , MRN: [id number] , AD[address]ESS: [address] [place] [address] PCP Allene Ivan, MD Patient Care Team: Allene Ivan, MD as PCP - General (Family Medicine) Eilleen Grates, MD as PCP - Cardiology (Cardiology) Maire Scot, MD as Consulting Physician (Pulmonary Disease) Danise Durie, Craige Dixon, MD (Inactive) (Cardiology) Dorothe Gaster, RD as Dietitian (Family Medicine) Eilleen Grates, MD as Consulting Physician (Cardiology)  This Provider for this visit: Treatment Team:  Attending Provider: Maire Scot, MD      12/10/2022 -   Chief Complaint  Patient presents with  Follow-up    Pft results from yesterday. Pt states that 10 mg of prednisone  is helping.      HPI ONDREA DOW 73 y.o. -here for follow-up.  After the last visit she called saying 5 mg of prednisone  was not helping her.  She told me this time that at 50 mg of prednisone  she feels great but then as she tapers and she gets into 10 mg  prednisone  she starts getting symptoms and at 5 mg symptoms are significantly worse.  She then called and she is back up to 10 mg and she feels better with just very mild symptoms.  In fact walking desaturation test is more stable her exam is more stable.  She feels she is got hypersensitive airways.  I did indicate to her that it is possible there is allergic or eosinophilic inflammatory component.  However her blood eosinophils have always been normal.  But then it is Mast by chronic prednisone .  Currently she is on 10 mg of prednisone .  We took a shared decision for her to stay on 10 mg prednisone  acknowledging the risk because this seems to be the best balance between symptom relief and quality of life.  Even exercise hypoxemia is better.  She did notice that recently in the last few months perfume particularly bothering her.  But otherwise she is stable.  She plans to revisit with her allergist Dr. Anselmo Kings   Current pulmonary function test was reviewed and is stable.      01/20/2023 Acute OV : ILD -chronic hypersensitivity pneumonitis and chronic respiratory failure Patient complains over the last 6 months that she has been having waxing and waning of symptoms with increased cough shortness of breath and decreased activity tolerance.  She has been treated with 3 steroid boost.  She says each time she has gotten much better and actually felt the best that she has felt in a long time.  But within a couple weeks of returning to prednisone  5mg  daily her symptoms started to return.  Most recently started noticing that her breathing was not doing as good 3 weeks ago.  Started to have more cough, congestion thick mucus decreased activity tolerance and increased oxygen  demands.  Today in the office walk test shows patient is unable to maintain O2 saturations on pulsed oxygen .  Requires continuous flow oxygen . Patient was started on a prednisone  burst yesterday at 40 mg to be tapered by 10 mg every 4 days.   She does feel like she is some better since starting on the higher dose of prednisone  yesterday.  Chest x-ray today shows chronic ILD changes.  No acute process. She denies any fever, chest, orthopnea, edema, calf pain.  OV 02/11/2023  Subjective:  Patient ID: Almyra Arn, female , DOB: 04-10-1951 , age 18 y.o. , MRN: [id number] , AD[address]ESS: [address] [place] [id number] PCP Allene Ivan, MD Patient Care Team: Allene Ivan, MD as PCP - General (Family Medicine) Eilleen Grates, MD as PCP - Cardiology (Cardiology) Maire Scot, MD as Consulting Physician (Pulmonary Disease) Danise Durie, Craige Dixon, MD (Inactive) (Cardiology) Dorothe Gaster, RD as Dietitian (Family Medicine) Eilleen Grates, MD as Consulting Physician (Cardiology)  This Provider for this visit: Treatment Team:  Attending Provider: Maire Scot, MD    02/11/2023 -   Chief Complaint  Patient presents with   Follow-up    Increase mucous, cough and oxygen  drops when she gets down to 10 mg of prednisone  symptoms start to come  back.  She remains on 10 mg and states it is not holding her.     HPI BELLA BRUMMET 73 y.o. -returns for follow-up.  She tells me that compared to a year's this year she is requiring more prednisone .  Even when I saw her last time she felt she needed a baseline of 10 mg prednisone .  We did that.  For years she been on 5 mg prednisone .  She started allergy shots recently Imuran does not helping her.  She is needed quite a few prednisone  burst and every time she goes to below 20 mg she feels she is desaturating easily.  In the past it used to be that she could maintain at 5 mg/day.  Then late last year she needed 10 mg low-dose of prednisone  per day.  Now she feels even  prednisone  10 mg is not "holding it".  She says when she was on 40 mg prednisone  she would climb stairs on room air and the pulse ox would stay at 95% and above.  Now she is on 10 mg/day of prednisone  and she is  requiring 5 L of portable oxygen  to climb up 1 flight of stairs without desaturations.  She is very worried about her disease.  We did get a high-resolution CT chest and there is no no progression but clinically she is not behaving like that.  Last echocardiogram November 2022 and there was no elevated pulmonary artery pressures.  She is willing to get another echocardiogram.     We discussed the fact she is guarding progressive ILD despite what the CT says.  She has got active inflammation.  I indicated to her that we will commit to an intermediate course of prolonged prednisone  at higher dose.  She is willing to do that and accept the risk.  Also indicated to her that we would need to add another steroid sparing and other immunomodulatory agent and would need to check safety labs.  We discussed there is no data on Rituxan for this particular issue of chronic HP progressive pulmonary fibrosis but there is data on CellCept .  She does not like the idea that CellCept  can cause diarrhea.  We went over the immunosuppressive and long-term cancer risk.  Did indicate to her that benefit outweighs the risk at this point.  She is agreeable to safety labs and be open to CellCept .  She is committing to CellCept .  But there is a possibility a month from now if she is doing well on the higher dose prednisone  and handling it well she might feel differently about CellCept .  We again discussed about lung transplant as an option and she is not interested.   ACUTE OV NAte MEier 03/18/23  [place] of chronic HP (has had VATS bx 2013 supportive of dx) f/b MR and seen by [facility] transplant program.   Last seen in clinic 02/11/23. At that point on prednisone  5 mg daily, pirfenidone . Had planned to start cellcept  at that visit. Ended up shifting gears and then had been planned for RHC for evaluation for candidacy for tyvaso today. 3 weeks ago started a prednisone  taper from 40 mg daily x1 week, currently on 20 mg daily for the rest  of this week.   Never did start z pack. She actually kinda wonders if she has sinus infection.   She does not take bactrim  due to history of headaches with sulfa  abx.   Weight is stable relative to prior visits   Otherwise pertinent review  of systems is negative.     OV 04/13/2023  Subjective:  Patient ID: Almyra Arn, female , DOB: Dec 21, 1950 , age 14 y.o. , MRN: [id number] , ADDRESS: [address] [place] [address] PCP Allene Ivan, MD Patient Care Team: Allene Ivan, MD as PCP - General (Family Medicine) Eilleen Grates, MD as PCP - Cardiology (Cardiology) Maire Scot, MD as Consulting Physician (Pulmonary Disease) Danise Durie, Craige Dixon, MD (Inactive) (Cardiology) Dorothe Gaster, RD as Dietitian (Family Medicine) Eilleen Grates, MD as Consulting Physician (Cardiology)  This Provider for this visit: Treatment Team:  Attending Provider: Maire Scot, MD    04/13/2023 -   Chief Complaint  Patient presents with   Follow-up    F/up   .   HPI VERLAINE EMBRY 73 y.o. -returns for follow-up.  Last seen approximately 2 months ago.  At that time she was tell me she needs a higher dose of prednisone .  Consider CellCept  and also right heart catheterization.  After much reflection she declined CellCept  and decided to go with right heart catheterization to the research protocol.  Therefore she had to have a second echocardiogram that showed mild elevation of pulmonary artery pressure.  She was going to have a right heart catheterization but then she got ill saw one of my colleagues urgently.  She needed to go back up on the prednisone .  She reports that every time she tapers the prednisone  she gets ill.  Therefore Dr. Jenny Mohs a month ago did a more extended slower taper of prednisone .  Currently she is on 20 mg/day since Apr 10, 2023 and so far her lung function is holding.  In fact her symptoms are better.  Her exercise hypoxemia test is also better and she feels  from a lung perspective a lot better.  She is clearly prednisone  responsive.  However the high-dose of prednisone  is causing significant changes with her metabolism particular with diabetes.  She is describing vision changes, hyperglycemia increased thirst, fatigue hemoglobin A1c is shot up to 7.5.  Primary care physician Dr. Bertell Broach has started on Jardiance and back on Ozempic.  On Apr 08, 2023 lab results review also shows increased hyperlipidemia.    Again had a conversation that she seems to have overall progressive phenotype with hypersensitive pneumonitis that is extremely prednisone  sensitive.  Unclear why she is losing control and why we are needing higher dose of prednisone .  I recommended steroid sparing CellCept  again.  However she is reluctant particular with the side effect profile.  We went over the cancer risk and opportunistic infection risk.  Side effect of diarrhea.  She wants to hold off.  She prefers to have a right heart catheterization and if that is pulmonary hypertension try inhaled treprostinil both of the pulmonary hypertension and potential theoretical risk of disease modulation.  The research study PHINDER (Dr Marygrace Snellen PI) can be restarted according to the research coordinator.  But she will have to have another echocardiogram first.  The billing issues when she first got into the study where the bills from the hospital were being sent to her.  She says this is now being resolved and she is no longer receiving those bills.  Did indicate to her that on no account she should be paying recent procedure bills although still should be going to insurance.       OV 06/08/2023  Subjective:  Patient ID: Almyra Arn, female , DOB: July 30, 1951 , age 23 y.o. , MRN: [id number] ,  ADDRESS: [address] [place] [address] PCP Allene Ivan, MD Patient Care Team: Allene Ivan, MD as PCP - General (Family Medicine) Eilleen Grates, MD as PCP - Cardiology  (Cardiology) Maire Scot, MD as Consulting Physician (Pulmonary Disease) Danise Durie, Craige Dixon, MD (Inactive) (Cardiology) Dorothe Gaster, RD as Dietitian (Family Medicine) Eilleen Grates, MD as Consulting Physician (Cardiology)  This Provider for this visit: Treatment Team:  Attending Provider: Maire Scot, MD    06/08/2023 -   Chief Complaint  Patient presents with   Follow-up   HPI Almyra Arn 74 y.o. -returns for follow-up.  She had pulmonary function test and this appears stable.  She tells me that with the higher dose prednisone  she has been doing well and her cough and effort tolerance is really good . However, having sifngianct hyperglycemia.  She is going to meet with primary care physician.  This because she is not able to tolerate JArdiance due to GI side effects. Without Jardiance she feels good but worried about her hyperglycemia. So she wants to drop prednisone  to 15mg  per day. I am supportive of this.   Other issue though PFT stable she reports 2 weeks of new coughing that feels different from when her HP cough when she lowers prednisone .  She brings out white sputum after coughing much.  She feels the pain in the throat for the last 1 week.  Her daughter and Reba Camper and son-in-law all have COVID.  They have not taken Paxlovid.  She believes she might have been exposed but she is tested for COVID multiple times.  She was in contact with me about this and the COVID screen negative.  On oral exam I thought there was some thrush.   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 I have messaged Dr McLEan to set up RHC through [facility]. She went out of window for the phinder study         OV 10/06/2023  Subjective:  Patient ID: Almyra Arn, female , DOB: 04-Jan-1951 , age 71 y.o. , MRN: [id number] , ADDRESS: [address] [place] [address] PCP Allene Ivan, MD Patient Care Team: Allene Ivan, MD as PCP - General (Family  Medicine) Eilleen Grates, MD as PCP - Cardiology (Cardiology) Maire Scot, MD as Consulting Physician (Pulmonary Disease) Danise Durie, Craige Dixon, MD (Inactive) (Cardiology) Dorothe Gaster, RD as Dietitian (Family Medicine) Eilleen Grates, MD as Consulting Physician (Cardiology)  This Provider for this visit: Treatment Team:  Attending Provider: Maire Scot, MD    10/06/2023 -   Chief Complaint  Patient presents with   Follow-up    Breathing is doing well today and she states "I feel very stable". Currently taking pred 20 mg. She has not used her o2 in a month.      Pft f/u, pt would like to go down on prednisone . Dealing with cough that is worse then baseline, with thick white mucus production.     HPI RITHA SAMPEDRO 73 y.o. -returns for follow-up.  She is currently on 20 mg/day prednisone .  She says is the best dose ever.  She feels her lung function is stable.  In fact PFT shows stability.  Excess hypoxemia test is also slightly better.  His symptom scores also showing stability and good control.  She wants to stay at this dose.  She understands that this means more side effects.  She is already seeing a libido for dry skin hyperglycemia.  Her recent bone  density test was normal.  She is up-to-date with her vaccination.  We discussed right heart catheterization.  She was going to have this based on echo from spring 2024 but her BNP at that time was normal currently she is stable.  Therefore we took a shared decision making to hold off on that.  Previously G6PD was normal.  We discussed Bactrim  prophylaxis.  Discussed that she is on prednisone  more than 10 mg/day but she is not on 2 immunosuppressants with a slight elevated risk and opportunistic infections.  Currently her GI system is feeling well and she is sensitive to medications.  Therefore we took a shared decision making to hold off on Bactrim  prophylaxis for PJP and other opportunistic infections.  No other new issues.  Of  note she has inappropriate sinus tachycardia.  She is on beta-blocker for this.  She is being followed by Dr. Lavonne Prairie for this.  She has upcoming visit in March 2025.  I did indicate to her that ivabradine is being used off label for inappropriate sinus tachycardia and she could discuss this with Dr. Lavonne Prairie.  She acknowledged understanding.       OV 01/31/2024  Subjective:  Patient ID: Almyra Arn, female , DOB: Apr 16, 1951 , age 63 y.o. , MRN: [id number] , ADDRESS: [address] [place] [address] PCP Allene Ivan, MD Patient Care Team: Allene Ivan, MD as PCP - General (Family Medicine) Eilleen Grates, MD as PCP - Cardiology (Cardiology) Maire Scot, MD as Consulting Physician (Pulmonary Disease) Danise Durie, Craige Dixon, MD (Cardiology) Dorothe Gaster, RD as Dietitian (Family Medicine) Eilleen Grates, MD as Consulting Physician (Cardiology)  This Provider for this visit: Treatment Team:  Attending Provider: Maire Scot, MD    01/31/2024 -   Chief Complaint  Patient presents with   Follow-up     HPI DARCUS EDDS 73 y.o. -been managing winter without flare up. O2 use only with heavy eertion. Prednisone  is at 15mg . PEr her cards and PCP - lung sound good. On Pred 20mg  -> very hyperglycemic and had blurry vision in sept 2024.  Now down tat 15mg  pre day. But also doing insulin  both Long Acting and short acting. Goal is < 250mg % ( . She did well with ozempic till she got "stomach paresis" and stopped ozempic.  Prednisone  is causing diarrhea.   We talked about cellcept . Worried abotu cellcept  -stomach issues . Worried about immune suppression. Goal is steroid sparing and helping reduce sugars. Talked about clinical experience with it.   Recommended pharmacisit counseling. - [name] GAJERA. She is more aligned about starting cllcept due to significant hyperglucemia with steroids. Overall I informed her worth looking at benefit > risk  Overall GI issues are  much better after dc ozempic -> but still 1/month IBS     OV 04/13/2024   Subjective:  Patient ID: Almyra Arn, female , DOB: [birth year], age 3 y.o. years. , MRN: [id number],  ADDRESS: [address] [place] [address] PCP  Allene Ivan, MD Providers : Treatment Team:  Attending Provider: Maire Scot, MD Patient Care Team: Allene Ivan, MD as PCP - General (Family Medicine) Eilleen Grates, MD as PCP - Cardiology (Cardiology) Maire Scot, MD as Consulting Physician (Pulmonary Disease) Danise Durie, Craige Dixon, MD (Cardiology) Dorothe Gaster, RD as Dietitian (Family Medicine) Eilleen Grates, MD as Consulting Physician (Cardiology)    Chief Complaint  Patient presents with   Follow-up     Follow-up chronic hypersensitive pneumonitis ; slow progressive  phenotype -  On ILD-pro registry protocol - On chronic prednisone   - 5mg  per day but stince spring 2024 needing more brsts and settled on 20mg  per day as of Nov 2024 - failed ofev  due to side efect  - failed esbriet  due to sde effects  March 2022   -Last echo 2018  - declind lung tx referral 2023   - declined cellcept  May 2024   - Last high-res CT Nov 222 -> mrach 2024: no change    -Right heart cath -March 2025 with normal pulmonary pressure mean PA 19 but concern for right-to-left shunt   -Started CellCept  March 2025 because of concern of progressive ILD - Hospitalized 03/14/2024 -03/17/2024 because of acute on chronic hypoxemic respiratory flare requiring steroids.  Respiratory virus panel positive for rhinovirus - Follow-up in clinic 4/ 28/25 with persistent respiratory failure and decompensation clinically: Empirically treated with 3 weeks Bactrim  for empiric PJP [facility] high]  HPI JOSSETTE ZIRBEL 73 y.o. -presents for follow-up.  Presents with her husband Adella Honey.  At this point in time since I last saw her along with the nurse practitioner on 03/20/2024 she has improved significantly from a  respiratory standpoint.  She is no longer needing oxygen  at rest.  She says when she bends or carries groceries pulse ox goes below 88% but when she climbs steps she is able to stay above 90%.  Today when she walked 200 feet pulse ox stayed at 90%.  There is a significant improvement from hospitalization and also last clinic visit on 03/20/2024 but it is definite decompensation from her baseline.  Pulmonary function test shows some improvement in FVC but the DLCO is worse.  She did have a right heart catheterization but there is no pulmonary hypertension but there is concern for right to left cardiac shunt.  Currently she is on prednisone  30 mg/day.  Are not on a taper.  [In the past when she is tapered prednisone  she will always have respiratory worsening].  The hospitalization itself was because of rhinovirus.  And then because of decompensation post discharge we put her on empiric Bactrim  which seems to have helped her a lot.  This is just in case she had PJP and she was considered too high risk for bronchoscopy.  She had been on chronic prednisone   At this point time she is reporting a lot of nonspecific side effects that includes weight gain, feeling fatigued, panting respiration, feeling dizzy [orthostatic blood pressure and heart rate normal here].  She is going to see the endocrinologist Dr. Ballin.  She is also fatigued.  At this point in time that she is not on nintedanib She is not on CellCept  she is taking Singulair . We referred her back to [facility] for lung transplant evaluation and this appointment is still pending.  Recent labs are okay. SYMPTOM SCALE - ILD Sept 2020 01/30/2020  08/29/2020  01/14/2021 146# 07/08/2021 147# - off esbriet . Sic past week 08/14/2021 149# - pred only. No antifibrotic 10/03/2021 148# - pred 5mg  07/21/2022 137# 10/27/2022  04/13/2023 20mg  pered - 134# 06/08/2023 135# , pred 20mg  10/06/2023 138# 04/13/2024   O2 use  o2 with ex o2 withe ex 2L     2LL o2 with  ex 2L with ex 2 laps on RA      Shortness of Breath  0 -> 5 scale with 5 being worst (score 6 If unable to do)             At rest  0 0 1 0 1 0 1 0 1 1  0 0 1  Simple tasks - showers, clothes change, eating, shaving 01 1 1 1.5 2 1 2 1 2 1 1 1 1   Household (dishes, doing bed, laundry) 0 3 3 3 4 2  3.5 2 3 2 2 2 3   Shopping 1 1 1 2 2 1 1 1 2 1 2 1 1   Walking level at own pace 1 2 2 1 2 2 3 1 2 2 1 3 1   Walking up Stairs 2 3 3 3 4 3 4 3 4 3 4  2.5 3  Total (30-36) Dyspnea Score 5 10 11  10.5 15 9  14.5 8 14 10 10 9 10   How bad is your cough? 1 3 2 2 2 2 2 2 3  0 2 1 1   How bad is your fatigue 1 2 2 3 2  0 3 1 2 2 4 2 5   How bad is nausea  0 0 3 0 0 1 0 0 0 0  0 0  How bad is vomiting?   0 0 0 0 0 1 0 0 0 0  0 0  How bad is diarrhea?  00 1 0 0 0 1 0 0 0 1  0 0  How bad is anxiety?  0 0 0 00 0 2 0 00 0 0  0 0  How bad is depression  0 2 1 1 0 1 0 0 0 0  0 0  00 0 0 Simple office walk 185 feet x  3 laps goal with forehead probe 04/13/2018  08/16/2018  12/15/2018  05/09/2019  08/22/2019  01/30/2020  08/29/2020  01/14/2021  07/08/2021  08/14/2021  10/03/2021  07/21/2022  10/27/2022  04/13/2023  06/08/2023  10/06/2023  04/13/2024   O2 used Room air Room air Room air Room air Room air Room air ra ra ra ra ra ra ra ra Ra sit stand ra   Number laps completed 3 3 3 3 2  stopped at 2 die to hip pain 3 laps - no hip pain following hip surgery 3 3 3  attempted byt did  only 2 3 bu stopped at 2 3 and did all 3 3 Ddi onl 2 las Sit/stand x 10 times X 10 Sit stand x 15   Comments about pace good Moderate pace Normal, hip bothering     avg pace avg  avg avg avg Good pace     Resting Pulse Ox/HR 98% and 73/min 98% and HR 77/min 99% and HR 61/min 98% and HR 70/min 98% 98% and 75/min 97% and 67/mi 94% and 80/min 96% and HR 72 98% and 71 100% and HR 82 98% ad HR 72 100% and HR 79 97% and heart rate 74 93% and HR 83 98% and HR 91   Final Pulse Ox/HR 91% and 91/min 93% and 92.min 94% and 92/min 93% and 98/min 91%  89% and 93/,imn  88% and 94 89% ad 88.nin 88% at 2nd  lap end HR 92 86% and 96 89% and HR 91 93% and HR 87 84% and HR 104 94% and heart rate 92 87% and HR 90 92% and HR 105 Walked 1 lap on room air went down to 90%.  Desaturated </= 88% no no no no   yes almost yes yes almst no yes no yes    Desaturated <= 3% points yes yes Yes, 5 points Yes, 5 points    Yes, 5 points Yes, 8 points Yes, 12 pints Yes  11 pot 5 pots 16 pont Yes, 3 poi yes    Got Tachycardic >/= 90/min yes yes yes yes     yes yes yes no       Symptoms at end of test none none Hip pain and very mild dyspnea  Stopped due to hip pain Moderate duyspnea with mask Mild to moderate dyspnea  Severe dyspnea Mild dyspnea Severe dyspnea Very mild dyspneax moderate No dyspnea Mild dyspnea    Miscellaneous comments x x    No hip pain    Similar to lastime Symptoms out of proportion ? better worse            PFT     Latest Ref Rng & Units 04/05/2024    8:21 AM 10/01/2023    9:49 AM 06/04/2023   10:12 AM 03/08/2023   10:49 AM 12/09/2022   11:48 AM 07/21/2022    9:50 AM 01/06/2022    9:51 AM  PFT Results  FVC-Pre L 1.52  P 1.43  1.44  1.45  1.53  1.54  1.58   FVC-Predicted Pre % 64  P 60  60  64  66  67  67   Pre FEV1/FVC % % 87  P 92  91  93  87  91  90   FEV1-Pre L 1.33  P 1.31  1.30  1.35  1.32  1.40  1.43   FEV1-Predicted Pre % 76  P 73  73  80  77  81  81   DLCO uncorrected ml/min/mmHg 8.29  P 9.73  10.82  8.11  9.64  8.16  8.62   DLCO UNC% % 49  P 58  64  50  59  50  53   DLCO corrected ml/min/mmHg 8.24  P 9.73  10.82  7.81  9.41  7.86  8.62   DLCO COR %Predicted % 49  P 58  64  48  58  48  53   DLVA Predicted % 78  P 81  85  65  81  64  75   TLC L    3.02      TLC % Predicted %    70      RV % Predicted %    70        P Preliminary result       LAB RESULTS last 96 hours No results found.       has a past medical history of Allergy, Arthritis, Asthma, Cataract, Coronary artery calcification seen on CAT scan (08/19/2017), DDD  (degenerative disc disease), thoracic, Depression, DOE (dyspnea on exertion), Fatty liver, GERD (gastroesophageal reflux disease), H/O steroid therapy, Heart palpitations (02/28/2015), Helicobacter pylori ab+, Hemorrhoids, Hiatal hernia, High cholesterol, History of chronic bronchitis, History of migraine, History of MRSA infection, Hyperlipidemia, mixed (08/19/2017), Hyperplastic colon polyp (2007), IBS (irritable bowel syndrome), Inguinal hernia, Insulin  resistance, Interstitial lung disease (HCC), MVP (mitral valve prolapse), Pneumonia, Pneumonitis, hypersensitivity (HCC), PONV (postoperative nausea and vomiting), Pre-diabetes, Pulmonary fibrosis (HCC), PVC (premature ventricular contraction) (08/19/2017), Rapid heart rate, Squamous cell carcinoma of skin, Tinnitus, right ear, and Vocal cord ulcer.   reports that she has never smoked. She has never used smokeless tobacco.  Past Surgical History:  Procedure Laterality Date   56 HOUR PH STUDY N/A 02/21/2018   Procedure: 24 HOUR PH STUDY;  Surgeon: Sergio Dandy, MD;  Location: [facility];  Service: Endoscopy;  Laterality: N/A;   BREAST BIOPSY Right 2009   BIOPSY, pt denies  BREAST BIOPSY Left 2003   Benign    CATARACT EXTRACTION Left    CESAREAN SECTION     COLONOSCOPY     ESOPHAGEAL MANOMETRY N/A 02/21/2018   Procedure: ESOPHAGEAL MANOMETRY (EM);  Surgeon: Sergio Dandy, MD;  Location: [facility];  Service: Endoscopy;  Laterality: N/A;   FOOT FRACTURE SURGERY  2006 or 2007   right   HYMENECTOMY     LUNG BIOPSY  09/28/2012   Procedure: LUNG BIOPSY;  Surgeon: Zelphia Higashi, MD;  Location: [facility] OR;  Service: Thoracic;  Laterality: N/A;  lung biopsies tims three   RIGHT HEART CATH N/A 04/04/2024   Procedure: RIGHT HEART CATH;  Surgeon: Darlis Eisenmenger, MD;  Location: [facility] INVASIVE CV LAB;  Service: Cardiovascular;  Laterality: N/A;   SQUAMOUS CELL CARCINOMA EXCISION Left    left arm   TOTAL HIP ARTHROPLASTY Right 09/10/2015    Procedure: RIGHT TOTAL HIP ARTHROPLASTY ANTERIOR APPROACH;  Surgeon: Claiborne Crew, MD;  Location: [facility];  Service: Orthopedics;  Laterality: Right;   TOTAL HIP ARTHROPLASTY Left 10/03/2019   Procedure: TOTAL HIP ARTHROPLASTY ANTERIOR APPROACH;  Surgeon: Claiborne Crew, MD;  Location: [facility];  Service: Orthopedics;  Laterality: Left;  70 mins   TUBAL LIGATION     UPPER GI ENDOSCOPY     VIDEO ASSISTED THORACOSCOPY  09/28/2012   Procedure: VIDEO ASSISTED THORACOSCOPY;  Surgeon: Zelphia Higashi, MD;  Location[facility] OR;  Service: Thoracic;  Laterality: Right;   VIDEO BRONCHOSCOPY  11/19/2011   Procedure: VIDEO BRONCHOSCOPY WITH FLUORO;  Surgeon: Maire Scot, MD;  Location: [facility];  Service: Endoscopy;;    Allergies  Allergen Reactions   Ozempic (0.25 Or 0.5 Mg-Dose) [Semaglutide(0.25 Or 0.5mg -Dos)] Other (See Comments)    "Paralyzed the stomach"   Tape Other (See Comments)    SKIN IS VERY FRAGILE!! "Burns and pulls up" the skin!! SKIN BRUISES and TEARS EASILY.Aaron Aas   Betadine [Povidone Iodine] Other (See Comments)    Blisters    Codeine Nausea And Vomiting   Garlic Diarrhea   Hydrocodone  Nausea And Vomiting   Macrobid [Nitrofurantoin] Other (See Comments)    Instructed by dr not to take    Ofev  [Nintedanib] Other (See Comments)    Abdominal pain   Onion Diarrhea   Shellfish Allergy Nausea And Vomiting   Statins Other (See Comments)    "Leg pain," but Pravachol  is tolerated   Sulfa  Antibiotics Other (See Comments)    Headaches     Immunization History  Administered Date(s) Administered   Fluad Quad(high Dose 65+) 07/17/2019   Hepatitis A 05/23/2010   Hepatitis B 06/23/2006   Influenza Split 08/11/2012, 08/14/2017   Influenza Whole 09/08/2011   Influenza, High Dose Seasonal PF 08/24/2016, 07/24/2017, 08/31/2018, 08/15/2020, 09/26/2020, 09/12/2021, 08/24/2022   Influenza,inj,Quad PF,6+ Mos 09/07/2013   Influenza-Unspecified 09/26/2014, 07/27/2015, 08/24/2023   MMR  05/23/2010   Moderna Sars-Covid-2 Vaccination 07/30/2021, 01/01/2022   PFIZER(Purple Top)SARS-COV-2 Vaccination 12/16/2019, 01/08/2020, 07/18/2020, 08/11/2022   Pneumococcal Conjugate-13 03/15/2014   Pneumococcal Polysaccharide-23 01/11/2018, 09/26/2020, 08/26/2021   Respiratory Syncytial Virus Vaccine,Recomb Aduvanted(Arexvy) 07/28/2022   Td 02/22/2003   Tdap 10/28/2012   Zoster Recombinant(Shingrix) 02/15/2017, 05/17/2017   Zoster, Live 12/04/2010    Family History  Problem Relation Age of Onset   Asthma Mother    Osteoarthritis Mother    Dementia Mother 77   Lymphoma Father    Diabetes Father    Hypertension Sister    Heart disease Sister    Kidney disease Sister  Allergic rhinitis Daughter    Ovarian cancer Maternal Aunt    Breast cancer Paternal Aunt    Breast cancer Paternal Aunt    Emphysema Maternal Grandmother    Asthma Maternal Grandmother    Lung disease Maternal Grandfather    Bone cancer Paternal Grandfather    Breast cancer Cousin    Breast cancer Cousin    Breast cancer Cousin    Colon cancer Neg Hx    Angioedema Neg Hx    Eczema Neg Hx    Immunodeficiency Neg Hx    Urticaria Neg Hx      Current Outpatient Medications:    albuterol  (PROAIR  HFA) 108 (90 Base) MCG/ACT inhaler, Inhale 2 puffs into the lungs every 4 (four) hours as needed for wheezing. Or coughing spells.  You may use 2 Puffs 5-10 minutes before exercise., Disp: 1 each, Rfl: 3   Alpha-D-Galactosidase (BEANO PO), Take 1 tablet by mouth as needed (as directed, if eating onion or garlic)., Disp: , Rfl:    AMBULATORY NON FORMULARY MEDICATION, See admin instructions. Allergy injections on Mondays and hold when sick, Disp: , Rfl:    ascorbic acid (VITAMIN C) 500 MG tablet, Take 500 mg by mouth daily., Disp: , Rfl:    ASHWAGANDHA PO, Take 1 capsule by mouth every other day., Disp: , Rfl:    budesonide  (PULMICORT ) 0.5 MG/2ML nebulizer solution, Take 2 mLs (0.5 mg total) by nebulization in the  morning and at bedtime. (Patient taking differently: Take 0.5 mg by nebulization 2 (two) times daily as needed (shortness of breath).), Disp: 360 mL, Rfl: 0   Calcium  Carb-Cholecalciferol (CALCIUM  500 + D PO), Take 1 tablet by mouth daily., Disp: , Rfl:    chlorpheniramine (CHLOR-TRIMETON) 4 MG tablet, Take 2 mg by mouth 2 (two) times daily as needed for allergies or rhinitis., Disp: , Rfl:    cholecalciferol (VITAMIN D3) 25 MCG (1000 UNIT) tablet, Take 1,000 Units by mouth every other day., Disp: , Rfl:    Continuous Glucose Sensor (DEXCOM G7 SENSOR) MISC, Inject 1 Device into the skin See admin instructions. Place one new sensor into the skin every 10 days, Disp: , Rfl:    dicyclomine  (BENTYL ) 10 MG capsule, Take 10 mg by mouth daily as needed for spasms., Disp: , Rfl:    diltiazem  (CARDIZEM  CD) 120 MG 24 hr capsule, Take 1 capsule (120 mg total) by mouth daily., Disp: 90 capsule, Rfl: 3   diphenhydrAMINE -PE-APAP (DELSYM COUGH/COLD NIGHT TIME) 12.5-5-325 MG/10ML LIQD, Take 10 mLs by mouth every 12 (twelve) hours as needed (for coughing)., Disp: , Rfl:    EPINEPHrine  0.3 mg/0.3 mL IJ SOAJ injection, Inject 0.3 mg into the muscle as needed for anaphylaxis., Disp: , Rfl:    ezetimibe  (ZETIA ) 10 MG tablet, Take 1 tablet (10 mg total) by mouth daily. (Patient taking differently: Take 10 mg by mouth at bedtime.), Disp: 90 tablet, Rfl: 3   fluticasone  (FLONASE ) 50 MCG/ACT nasal spray, Spray 2 sprays into each nostril every day., Disp: 48 g, Rfl: 12   fluticasone  furoate-vilanterol (BREO ELLIPTA ) 100-25 MCG/ACT AEPB, Inhale 1 puff into the lungs daily., Disp: 60 each, Rfl: 0   HUMALOG  KWIKPEN 100 UNIT/ML KwikPen, Inject 6 Units into the skin 3 (three) times daily before meals., Disp: , Rfl:    Hypromellose (NATURES TEARS OP), Place 1 drop into both eyes in the morning and at bedtime., Disp: , Rfl:    ipratropium-albuterol  (DUONEB) 0.5-2.5 (3) MG/3ML SOLN, Take 3 mLs by nebulization every  6 (six) hours as  needed (for shortness of breath or wheezing or when sick)., Disp: 360 mL, Rfl: 0   LANTUS  SOLOSTAR 100 UNIT/ML Solostar Pen, Inject 20 Units into the skin daily before breakfast., Disp: , Rfl:    levocetirizine (XYZAL ) 5 MG tablet, Take 5 mg by mouth at bedtime., Disp: , Rfl:    metFORMIN  (GLUCOPHAGE -XR) 500 MG 24 hr tablet, Take 500 mg by mouth daily with breakfast., Disp: , Rfl:    metoprolol  succinate (TOPROL -XL) 25 MG 24 hr tablet, Take 1 tablet (25 mg total) by mouth daily., Disp: 90 tablet, Rfl: 3   montelukast  (SINGULAIR ) 10 MG tablet, TAKE 1 TABLET BY MOUTH EVERYDAY AT BEDTIME, Disp: 90 tablet, Rfl: 2   Multiple Vitamin (MULTIVITAMIN) tablet, Take 1 tablet by mouth daily with breakfast., Disp: , Rfl:    nystatin  (MYCOSTATIN ) 100000 UNIT/ML suspension, Take 5 mLs (500,000 Units total) by mouth 4 (four) times daily for 10 days., Disp: 210 mL, Rfl: 0   omeprazole  (PRILOSEC  OTC) 20 MG tablet, Take 10 mg by mouth daily before breakfast., Disp: , Rfl:    OXYGEN , Inhale 4-5 L/min into the lungs See admin instructions. Inhale 4-5 L/min at bedtime and as needed for shortness of breath during the day, Disp: , Rfl:    pravastatin  (PRAVACHOL ) 40 MG tablet, Take 1 tablet (40 mg total) by mouth every evening., Disp: 90 tablet, Rfl: 1   predniSONE  (DELTASONE ) 10 MG tablet, Take 6 tablets (60 mg total) by mouth daily with breakfast for 14 days, THEN 4 tablets (40 mg total) daily with breakfast for 14 days, THEN 3 tablets (30 mg total) daily with breakfast for 14 days, THEN 2 tablets (20 mg total) daily with breakfast for 14 days, THEN 1 tablet (10 mg total) daily with breakfast., Disp: 240 tablet, Rfl: 0   sodium chloride  (OCEAN) 0.65 % SOLN nasal spray, Place 1 spray into both nostrils as needed for congestion., Disp: , Rfl:    Sodium Chloride , Inhalant, 7 % NEBU, Use per nebulizer once a day to help clear sputum (Patient taking differently: Use per nebulizer once a day as needed to help clear sputum), Disp:  120 mL, Rfl: 3   mycophenolate  (CELLCEPT ) 500 MG tablet, Take 1 tab twice daily x 2 weeks. REPEAT LABS. If stable, increase to 2 tabs twice daily x 2 weeks. REPEAT LABS. If stable, increase to 3 tabs twice daily (Patient not taking: Reported on 04/13/2024), Disp: 252 tablet, Rfl: 0   nitroGLYCERIN  (NITROSTAT ) 0.4 MG SL tablet, Place 1 tablet (0.4 mg total) under the tongue every 5 (five) minutes as needed for chest pain., Disp: 25 tablet, Rfl: 12      Objective:   Vitals:   04/13/24 1600 04/13/24 1601  BP: 134/68   Pulse: 84   SpO2: 94%   Weight:  144 lb 12.8 oz (65.7 kg)  Height:  4\' 11"  (1.499 m)    Estimated body mass index is 29.25 kg/m as calculated from the following:   Height as of this encounter: 4\' 11"  (1.499 m).   Weight as of this encounter: 144 lb 12.8 oz (65.7 kg).  @WEIGHTCHANGE @  [employer]   04/13/24 1601  Weight: 144 lb 12.8 oz (65.7 kg)     Physical Exam   General: No distress.  Looks much better but cushingoid O2 at rest: no Cane present: no Sitting in wheel chair: no Frail: no Obese: no Neuro: Alert and Oriented x 3. GCS 15. Speech normal Psych: Pleasant  Resp:  Barrel Chest - no.  Wheeze - no, Crackles - yes al over, No overt respiratory distress CVS: Normal heart sounds. Murmurs - no Ext: Stigmata of Connective Tissue Disease - no HEENT: Normal upper airway. PEERL +. No post nasal drip        Assessment:       ICD-10-CM   1. ILD (interstitial lung disease) (HCC)  J84.9 ECHOCARDIOGRAM COMPLETE BUBBLE STUDY    AMB referral to pulmonary rehabilitation    Pulmonary function test    2. Chronic respiratory failure with hypoxia (HCC)  J96.11 ECHOCARDIOGRAM COMPLETE BUBBLE STUDY    AMB referral to pulmonary rehabilitation    Pulmonary function test    3. Hypersensitivity pneumonitis (HCC)  J67.9 ECHOCARDIOGRAM COMPLETE BUBBLE STUDY    AMB referral to pulmonary rehabilitation    Pulmonary function test    4. Immunosuppression (HCC)  D84.9  ECHOCARDIOGRAM COMPLETE BUBBLE STUDY    AMB referral to pulmonary rehabilitation    Pulmonary function test    5. Physical deconditioning  R53.81 ECHOCARDIOGRAM COMPLETE BUBBLE STUDY    AMB referral to pulmonary rehabilitation    Pulmonary function test    6. Hospital discharge follow-up  Z09 ECHOCARDIOGRAM COMPLETE BUBBLE STUDY    AMB referral to pulmonary rehabilitation    Pulmonary function test    7. Rhinovirus  B34.8 ECHOCARDIOGRAM COMPLETE BUBBLE STUDY    AMB referral to pulmonary rehabilitation    Pulmonary function test    8. History of acute respiratory failure  Z87.09 ECHOCARDIOGRAM COMPLETE BUBBLE STUDY    AMB referral to pulmonary rehabilitation    Pulmonary function test         Plan:     Patient Instructions     ICD-10-CM   1. ILD (interstitial lung disease) (HCC)  J84.9     2. Chronic respiratory failure with hypoxia (HCC)  J96.11     3. Hypersensitivity pneumonitis (HCC)  J67.9     4. Immunosuppression (HCC)  D84.9     5. Physical deconditioning  R53.81     6. Hospital discharge follow-up  Z09     7. Rhinovirus  B34.8     8. History of acute respiratory failure  Z87.09       Much improved but dealing with prednisone  tocixity and post hospitalization deconditioning Glad cardiac catheterization did not show any evidence of pulmonary hypertension although Dr. Hendricks Locker was concerned about right-to-left shunt  P{lan  - Continue prednisone  taper - Good idea of seeing endocrinology Dr Ronelle Coffee -we will send our records to her - get Bubble ECHO - Refer pulmonary rehabilitation - I think we can slowly restart CellCept   =-Going to send a message to our pharmacy team - Continue oxygen  use for pulse ox needs greater than 88% - Make sure you keep up with [facility] lung transplant clinic - At follow-up we will consider any emerging treatment options such as JACSYD  Follow-up - 8-10 weeks with Dr. Bertrum Brodie 30-minute visit but after pulmonary function  testing     FOLLOWUP Return in about 8 weeks (around 06/08/2024) for 30 min visit, after Spiro and DLCO, with Dr Bertrum Brodie, Face to Face Visit.    SIGNATURE    Dr. Maire Scot, M.D., F.C.C.P,  Pulmonary and Critical Care Medicine Staff Physician, [facility] Center Director - Interstitial Lung Disease  Program  Pulmonary Fibrosis [facility] at [facility] [place], [place], [address]  Pager: [phone], If no answer or between  15:00h -  7:00h: call [phone] Telephone: [phone]  5:08 PM 04/13/2024

## 2024-04-13 NOTE — Telephone Encounter (Signed)
 Rhonda [name]  -> pleaes ensure chat goes to Dr Damita Dull    Dr. Maire Scot, M.D., F.C.C.P,  Pulmonary and Critical Care Medicine Staff Physician, [facility] Center Director - Interstitial Lung Disease  Program  Pulmonary Fibrosis [facility] at [facility] [place], [place], [address]   Pager: [phone], If no answer  -> Check AMION or Try [phone] Telephone (clinical office): [phone] Telephone (research): [phone]  5:15 PM 04/13/2024

## 2024-04-13 NOTE — Telephone Encounter (Signed)
 [name]   I am thinking of restarting CellCept .  Any concerns?      SIGNATURE    Dr. Maire Scot, M.D., F.C.C.P,  Pulmonary and Critical Care Medicine Staff Physician, [facility] Center Director - Interstitial Lung Disease  Program  Pulmonary Fibrosis [facility] at [facility] [place], [place], [address]   Pager: [phone], If no answer  -> Check AMION or Try [phone] Telephone (clinical office): [phone] Telephone (research): [phone]  5:08 PM 04/13/2024

## 2024-04-14 NOTE — Telephone Encounter (Signed)
 No concerns from medication interaction or LFTs for restarting Cellcept . Apears she was only on low dose.  Do you want pharmacy to assist with restarting?  Geraldene Kleine, PharmD, MPH, BCPS, CPP Clinical Pharmacist (Rheumatology and Pulmonology)

## 2024-04-14 NOTE — Telephone Encounter (Signed)
 Copy of last ov, ct chest and PFT faxed to Dr. Ronelle Coffee (endo)

## 2024-04-18 NOTE — Telephone Encounter (Signed)
 Yes restart lower dose

## 2024-04-20 ENCOUNTER — Encounter: Payer: Self-pay | Admitting: Internal Medicine

## 2024-04-21 ENCOUNTER — Telehealth (HOSPITAL_COMMUNITY): Payer: Self-pay

## 2024-04-21 ENCOUNTER — Ambulatory Visit
Admission: RE | Admit: 2024-04-21 | Discharge: 2024-04-21 | Disposition: A | Discharge status: Home / Self Care | Source: Ambulatory Visit | Attending: Family Medicine | Admitting: Family Medicine

## 2024-04-21 DIAGNOSIS — Z1231 Encounter for screening mammogram for malignant neoplasm of breast: Secondary | ICD-10-CM

## 2024-04-21 LAB — MYCOBACTERIA,CULT W/FLUOROCHROME SMEAR
MICRO NUMBER:: 16337664
SMEAR:: NONE SEEN
SPECIMEN QUALITY:: ADEQUATE

## 2024-04-21 NOTE — Telephone Encounter (Signed)
 Pt insurance is active and benefits verified through [facility]. Co-pay $0, DED $0/$0 met, out of pocket $4,000/$1,185.79 met, co-insurance 0%. No pre-authorization required. 04/21/2024 @ 3:12pm, spoke with Christiana Cower., REF# [id number].  Patient confirmed to be covered at 100%.

## 2024-04-25 NOTE — Telephone Encounter (Signed)
 Based on MyChart message, patient has two full bottles for mycophenoalte at home. Will not need to send refills at this time  Repeat CBC/CMP in 2 weeks after restarting. Future orders remain in place  Geraldene Kleine, PharmD, MPH, BCPS, CPP Clinical Pharmacist (Rheumatology and Pulmonology)

## 2024-04-27 ENCOUNTER — Encounter (HOSPITAL_COMMUNITY): Payer: Self-pay

## 2024-04-27 ENCOUNTER — Telehealth (HOSPITAL_COMMUNITY): Payer: Self-pay

## 2024-04-27 NOTE — Telephone Encounter (Signed)
Attempted to call patient in regards to Pulmonary Rehab - LM on VM   Sent letter 

## 2024-04-28 ENCOUNTER — Telehealth (HOSPITAL_COMMUNITY): Payer: Self-pay

## 2024-04-28 NOTE — Telephone Encounter (Signed)
 Pt returned phone call and is interested in the pulmonary rehab program. Pt will come in for orientation on 6/13@1030  and will attend the 1015 exercise class time.   Sent letter

## 2024-05-02 DIAGNOSIS — J679 Hypersensitivity pneumonitis due to unspecified organic dust: Secondary | ICD-10-CM

## 2024-05-02 DIAGNOSIS — Z5181 Encounter for therapeutic drug level monitoring: Secondary | ICD-10-CM

## 2024-05-02 DIAGNOSIS — Z79899 Other long term (current) drug therapy: Secondary | ICD-10-CM

## 2024-05-04 ENCOUNTER — Encounter: Payer: Self-pay | Admitting: *Deleted

## 2024-05-04 ENCOUNTER — Other Ambulatory Visit (HOSPITAL_COMMUNITY): Payer: Self-pay

## 2024-05-04 ENCOUNTER — Telehealth (HOSPITAL_COMMUNITY): Payer: Self-pay

## 2024-05-04 ENCOUNTER — Other Ambulatory Visit: Payer: Self-pay | Admitting: Internal Medicine

## 2024-05-04 ENCOUNTER — Other Ambulatory Visit

## 2024-05-04 DIAGNOSIS — Z79899 Other long term (current) drug therapy: Secondary | ICD-10-CM

## 2024-05-04 MED ORDER — DEXCOM G7 SENSOR MISC
0 refills | Status: AC
  Filled 2024-05-04: qty 3, 30d supply, fill #0

## 2024-05-04 NOTE — Telephone Encounter (Addendum)
 Called to confirm appt. No answer. Left VM.

## 2024-05-05 ENCOUNTER — Encounter (HOSPITAL_COMMUNITY)
Admission: RE | Admit: 2024-05-05 | Discharge: 2024-05-05 | Disposition: A | Discharge status: Home / Self Care | Source: Ambulatory Visit | Attending: Internal Medicine | Admitting: Internal Medicine

## 2024-05-05 ENCOUNTER — Other Ambulatory Visit (HOSPITAL_COMMUNITY): Payer: Self-pay

## 2024-05-05 ENCOUNTER — Encounter (HOSPITAL_COMMUNITY): Payer: Self-pay

## 2024-05-05 VITALS — BP 126/58 | HR 75 | Wt 145.5 lb

## 2024-05-05 DIAGNOSIS — J849 Interstitial pulmonary disease, unspecified: Secondary | ICD-10-CM | POA: Insufficient documentation

## 2024-05-05 LAB — COMPREHENSIVE METABOLIC PANEL WITH GFR
AG Ratio: 1.8 (calc) (ref 1.0–2.5)
ALT: 23 U/L (ref 6–29)
AST: 18 U/L (ref 10–35)
Albumin: 3.9 g/dL (ref 3.6–5.1)
Alkaline phosphatase (APISO): 46 U/L (ref 37–153)
BUN/Creatinine Ratio: 32 (calc) — ABNORMAL HIGH (ref 6–22)
BUN: 36 mg/dL — ABNORMAL HIGH (ref 7–25)
CO2: 24 mmol/L (ref 20–32)
Calcium: 9.5 mg/dL (ref 8.6–10.4)
Chloride: 104 mmol/L (ref 98–110)
Creat: 1.12 mg/dL — ABNORMAL HIGH (ref 0.60–1.00)
Globulin: 2.2 g/dL (ref 1.9–3.7)
Glucose, Bld: 137 mg/dL — ABNORMAL HIGH (ref 65–99)
Potassium: 4.6 mmol/L (ref 3.5–5.3)
Sodium: 141 mmol/L (ref 135–146)
Total Bilirubin: 0.4 mg/dL (ref 0.2–1.2)
Total Protein: 6.1 g/dL (ref 6.1–8.1)
eGFR: 51 mL/min/1.73m2 — ABNORMAL LOW

## 2024-05-05 LAB — CBC WITH DIFFERENTIAL/PLATELET
Absolute Lymphocytes: 1217 {cells}/uL (ref 850–3900)
Absolute Monocytes: 323 {cells}/uL (ref 200–950)
Basophils Absolute: 92 {cells}/uL (ref 0–200)
Basophils Relative: 0.6 %
Eosinophils Absolute: 15 {cells}/uL (ref 15–500)
Eosinophils Relative: 0.1 %
HCT: 44.6 % (ref 35.0–45.0)
Hemoglobin: 14.2 g/dL (ref 11.7–15.5)
MCH: 27.3 pg (ref 27.0–33.0)
MCHC: 31.8 g/dL — ABNORMAL LOW (ref 32.0–36.0)
MCV: 85.8 fL (ref 80.0–100.0)
MPV: 10.7 fL (ref 7.5–12.5)
Monocytes Relative: 2.1 %
Neutro Abs: 13752 {cells}/uL — ABNORMAL HIGH (ref 1500–7800)
Neutrophils Relative %: 89.3 %
Platelets: 232 10*3/uL (ref 140–400)
RBC: 5.2 10*6/uL — ABNORMAL HIGH (ref 3.80–5.10)
RDW: 18.3 % — ABNORMAL HIGH (ref 11.0–15.0)
Total Lymphocyte: 7.9 %
WBC: 15.4 10*3/uL — ABNORMAL HIGH (ref 3.8–10.8)

## 2024-05-05 NOTE — Progress Notes (Signed)
 Pulmonary Individual Treatment Plan  Patient Details  Name: Rhonda [name] MRN: [id number] Date of Birth: 03-14-1951 Referring Provider:   Gattis Kass Pulmonary Rehab Walk Test from 05/05/2024 in [facility]  Referring Provider Ramaswamy    Initial Encounter Date:  Flowsheet Row Pulmonary Rehab Walk Test from 05/05/2024 in [facility]  Date 05/05/24    Visit Diagnosis: ILD (interstitial lung disease) (HCC)  Patient's Home Medications on Admission:   Current Outpatient Medications:    albuterol  (PROAIR  HFA) 108 (90 Base) MCG/ACT inhaler, Inhale 2 puffs into the lungs every 4 (four) hours as needed for wheezing. Or coughing spells.  You may use 2 Puffs 5-10 minutes before exercise., Disp: 1 each, Rfl: 3   Alpha-D-Galactosidase (BEANO PO), Take 1 tablet by mouth as needed (as directed, if eating onion or garlic)., Disp: , Rfl:    AMBULATORY NON FORMULARY MEDICATION, See admin instructions. Allergy injections on Mondays and hold when sick, Disp: , Rfl:    ascorbic acid (VITAMIN C) 500 MG tablet, Take 500 mg by mouth daily., Disp: , Rfl:    ASHWAGANDHA PO, Take 1 capsule by mouth every other day., Disp: , Rfl:    budesonide  (PULMICORT ) 0.5 MG/2ML nebulizer solution, Take 2 mLs (0.5 mg total) by nebulization in the morning and at bedtime., Disp: 360 mL, Rfl: 0   Calcium  Carb-Cholecalciferol (CALCIUM  500 + D PO), Take 1 tablet by mouth daily., Disp: , Rfl:    chlorpheniramine (CHLOR-TRIMETON) 4 MG tablet, Take 2 mg by mouth 2 (two) times daily as needed for allergies or rhinitis., Disp: , Rfl:    cholecalciferol (VITAMIN D3) 25 MCG (1000 UNIT) tablet, Take 1,000 Units by mouth every other day., Disp: , Rfl:    Continuous Glucose Sensor (DEXCOM G7 SENSOR) MISC, Inject 1 Device into the skin See admin instructions. Place one new sensor into the skin every 10 days, Disp: , Rfl:     Continuous Glucose Sensor (DEXCOM G7 SENSOR) MISC, Check blood sugar fasting and before meals and at bedtime., Disp: 4 each, Rfl: 0   dicyclomine  (BENTYL ) 10 MG capsule, Take 10 mg by mouth daily as needed for spasms., Disp: , Rfl:    diltiazem  (CARDIZEM  CD) 120 MG 24 hr capsule, Take 1 capsule (120 mg total) by mouth daily., Disp: 90 capsule, Rfl: 3   diphenhydrAMINE -PE-APAP (DELSYM COUGH/COLD NIGHT TIME) 12.5-5-325 MG/10ML LIQD, Take 10 mLs by mouth every 12 (twelve) hours as needed (for coughing)., Disp: , Rfl:    EPINEPHrine  0.3 mg/0.3 mL IJ SOAJ injection, Inject 0.3 mg into the muscle as needed for anaphylaxis., Disp: , Rfl:    ezetimibe  (ZETIA ) 10 MG tablet, Take 1 tablet (10 mg total) by mouth daily., Disp: 90 tablet, Rfl: 3   fluticasone  (FLONASE ) 50 MCG/ACT nasal spray, Spray 2 sprays into each nostril every day., Disp: 48 g, Rfl: 12   fluticasone  furoate-vilanterol (BREO ELLIPTA ) 100-25 MCG/ACT AEPB, Inhale 1 puff into the lungs daily., Disp: 60 each, Rfl: 0   HUMALOG  KWIKPEN 100 UNIT/ML KwikPen, Inject 6 Units into the skin 3 (three) times daily before meals., Disp: , Rfl:    ipratropium-albuterol  (DUONEB) 0.5-2.5 (3) MG/3ML SOLN, Take 3 mLs by nebulization every 6 (six) hours as needed (for shortness of breath or wheezing or when sick)., Disp: 360 mL, Rfl: 0   LANTUS  SOLOSTAR 100 UNIT/ML Solostar Pen, Inject 20 Units into the skin daily before  breakfast., Disp: , Rfl:    levocetirizine (XYZAL ) 5 MG tablet, Take 5 mg by mouth at bedtime., Disp: , Rfl:    metFORMIN  (GLUCOPHAGE -XR) 500 MG 24 hr tablet, Take 500 mg by mouth daily with breakfast., Disp: , Rfl:    metoprolol  succinate (TOPROL -XL) 25 MG 24 hr tablet, Take 1 tablet (25 mg total) by mouth daily., Disp: 90 tablet, Rfl: 3   montelukast  (SINGULAIR ) 10 MG tablet, TAKE 1 TABLET BY MOUTH EVERYDAY AT BEDTIME, Disp: 90 tablet, Rfl: 2   Multiple Vitamin (MULTIVITAMIN) tablet, Take 1 tablet by mouth daily with breakfast., Disp: , Rfl:     mycophenolate  (CELLCEPT ) 500 MG tablet, Take 1 tab twice daily x 2 weeks. REPEAT LABS. If stable, increase to 2 tabs twice daily x 2 weeks. REPEAT LABS. If stable, increase to 3 tabs twice daily, Disp: 252 tablet, Rfl: 0   nitroGLYCERIN  (NITROSTAT ) 0.4 MG SL tablet, Place 1 tablet (0.4 mg total) under the tongue every 5 (five) minutes as needed for chest pain., Disp: 25 tablet, Rfl: 12   omeprazole  (PRILOSEC  OTC) 20 MG tablet, Take 10 mg by mouth daily before breakfast., Disp: , Rfl:    OXYGEN , Inhale 4-5 L/min into the lungs See admin instructions. Inhale 4-5 L/min at bedtime and as needed for shortness of breath during the day, Disp: , Rfl:    pravastatin  (PRAVACHOL ) 40 MG tablet, Take 1 tablet (40 mg total) by mouth every evening., Disp: 90 tablet, Rfl: 1   predniSONE  (DELTASONE ) 10 MG tablet, Take 6 tablets (60 mg total) by mouth daily with breakfast for 14 days, THEN 4 tablets (40 mg total) daily with breakfast for 14 days, THEN 3 tablets (30 mg total) daily with breakfast for 14 days, THEN 2 tablets (20 mg total) daily with breakfast for 14 days, THEN 1 tablet (10 mg total) daily with breakfast., Disp: 240 tablet, Rfl: 0   sodium chloride  (OCEAN) 0.65 % SOLN nasal spray, Place 1 spray into both nostrils as needed for congestion., Disp: , Rfl:    Sodium Chloride , Inhalant, 7 % NEBU, Use per nebulizer once a day to help clear sputum, Disp: 120 mL, Rfl: 3   Hypromellose (NATURES TEARS OP), Place 1 drop into both eyes in the morning and at bedtime. (Patient not taking: Reported on 05/05/2024), Disp: , Rfl:   Past Medical History: Past Medical History:  Diagnosis Date   Allergy    Arthritis    history spinal stenosis. osteoarthritis right hip   Asthma    Cataract    Coronary artery calcification seen on CAT scan 08/19/2017   >300 on CT scan 08/2017   DDD (degenerative disc disease), thoracic    Depression    DOE (dyspnea on exertion)    a. 04/2010 Lexi MV EF 71%, no ischemia/infarct;      Fatty liver    GERD (gastroesophageal reflux disease)    H/O steroid therapy    Steroid use orally over 4 yrs- for Lung Fibrosis   Heart palpitations 02/28/2015   Helicobacter pylori ab+    Hemorrhoids    Hiatal hernia    High cholesterol    History of chronic bronchitis    as child   History of migraine    History of MRSA infection    Hyperlipidemia, mixed 08/19/2017   Hyperplastic colon polyp 2007   IBS (irritable bowel syndrome)    Inguinal hernia    right   Insulin  resistance    past   Interstitial lung disease (  HCC)    MVP (mitral valve prolapse)    Posterior mitral valve leaflet with mild MR   Pneumonia    Pneumonitis, hypersensitivity (HCC)    a. 09/2012 s/p Bx - ? 2/2 bird, mold, oil paint exposure ->on steroids, followed by pulm.   PONV (postoperative nausea and vomiting)    Pre-diabetes    takes Metformin    Pulmonary fibrosis (HCC)    Dr. Bertrum Brodie follows- stable at present   PVC (premature ventricular contraction) 08/19/2017   Rapid heart rate    Dr. Micael Adas follows- last visit Epic note 9'16   Squamous cell carcinoma of skin    Tinnitus, right ear    Vocal cord ulcer     Tobacco Use: Social History   Tobacco Use  Smoking Status Never  Smokeless Tobacco Never  Tobacco Comments   pt states she experimented in college    Labs: Review Flowsheet  More data exists      Latest Ref Rng & Units 11/08/2017 12/08/2018 09/28/2019 03/15/2024 04/04/2024  Labs for ITP Cardiac and Pulmonary Rehab  Cholestrol 100 - 199 mg/dL 782  956  - - -  LDL (calc) 0 - 99 mg/dL 77  83  - - -  HDL-C >21 mg/dL 71  68  - - -  Trlycerides 0 - 149 mg/dL 308  657  - - -  Hemoglobin A1c 4.8 - 5.6 % - 6.0  6.2  7.2  -  Bicarbonate 20.0 - 28.0 mmol/L - - - - 21.7  22.1  22.2   TCO2 22 - 32 mmol/L - - - - 23  23  23    Acid-base deficit 0.0 - 2.0 mmol/L - - - - 2.0  2.0  2.0   O2 Saturation % - - - - 71  71  64     Details       Multiple values from one day are sorted in  reverse-chronological order         Capillary Blood Glucose: Lab Results  Component Value Date   GLUCAP 183 (H) 04/04/2024   GLUCAP 118 (H) 04/04/2024   GLUCAP 175 (H) 03/17/2024   GLUCAP 277 (H) 03/16/2024   GLUCAP 228 (H) 03/16/2024    POCT Glucose     Row Name 05/05/24 1153             POCT Blood Glucose   Pre-Exercise 163 mg/dL          Pulmonary Assessment Scores:  Pulmonary Assessment Scores     Row Name 05/05/24 1145         ADL UCSD   ADL Phase Entry     SOB Score total 55       CAT Score   CAT Score 21       mMRC Score   mMRC Score 3       UCSD: Self-administered rating of dyspnea associated with activities of daily living (ADLs) 6-point scale (0 = not at all to 5 = maximal or unable to do because of breathlessness)  Scoring Scores range from 0 to 120.  Minimally important difference is 5 units  CAT: CAT can identify the health impairment of COPD patients and is better correlated with disease progression.  CAT has a scoring range of zero to 40. The CAT score is classified into four groups of low (less than 10), medium (10 - 20), high (21-30) and very high (31-40) based on the impact level of disease on health status. A CAT  score over 10 suggests significant symptoms.  A worsening CAT score could be explained by an exacerbation, poor medication adherence, poor inhaler technique, or progression of COPD or comorbid conditions.  CAT MCID is 2 points  mMRC: mMRC (Modified Medical Research Council) Dyspnea Scale is used to assess the degree of baseline functional disability in patients of respiratory disease due to dyspnea. No minimal important difference is established. A decrease in score of 1 point or greater is considered a positive change.   Pulmonary Function Assessment:  Pulmonary Function Assessment - 05/05/24 1103       Breath   Bilateral Breath Sounds Rales;Basilar    Shortness of Breath Yes;Limiting activity;Fear of Shortness of  Breath          Exercise Target Goals: Exercise Program Goal: Individual exercise prescription set using results from initial 6 min walk test and THRR while considering  patient's activity barriers and safety.   Exercise Prescription Goal: Initial exercise prescription builds to 30-45 minutes a day of aerobic activity, 2-3 days per week.  Home exercise guidelines will be given to patient during program as part of exercise prescription that the participant will acknowledge.  Activity Barriers & Risk Stratification:  Activity Barriers & Cardiac Risk Stratification - 05/05/24 1056       Activity Barriers & Cardiac Risk Stratification   Activity Barriers Left Hip Replacement;Right Hip Replacement;Deconditioning;Muscular Weakness;Shortness of Breath;Arthritis;Balance Concerns   meds contribute to unstable balance   Cardiac Risk Stratification Moderate          6 Minute Walk:  6 Minute Walk     Row Name 05/05/24 1150         6 Minute Walk   Phase Initial     Distance 903 feet     Walk Time 6 minutes     # of Rest Breaks 2  2:25-2:44, 4:32-4:51     MPH 1.71     METS 1.74     RPE 12     Perceived Dyspnea  2.5     VO2 Peak 6.09     Symptoms No     Resting HR 75 bpm     Resting BP 126/58     Resting Oxygen  Saturation  92 %     Exercise Oxygen  Saturation  during 6 min walk 89 %     Max Ex. HR 93 bpm     Max Ex. BP 150/64     2 Minute Post BP 134/62       Interval HR   1 Minute HR 89     2 Minute HR 91     3 Minute HR 89     4 Minute HR 92     5 Minute HR 90     6 Minute HR 93     2 Minute Post HR 72     Interval Heart Rate? Yes       Interval Oxygen    Interval Oxygen ? Yes     Baseline Oxygen  Saturation % 92 %     1 Minute Oxygen  Saturation % 91 %     1 Minute Liters of Oxygen  0 L     2 Minute Oxygen  Saturation % 89 %     2 Minute Liters of Oxygen  0 L     3 Minute Oxygen  Saturation % 91 %     3 Minute Liters of Oxygen  0 L     4 Minute Oxygen  Saturation % 89 %  4 Minute Liters of Oxygen  0 L     5 Minute Oxygen  Saturation % 92 %     5 Minute Liters of Oxygen  0 L     6 Minute Oxygen  Saturation % 90 %     6 Minute Liters of Oxygen  0 L     2 Minute Post Oxygen  Saturation % 99 %     2 Minute Post Liters of Oxygen  0 L        Oxygen  Initial Assessment:  Oxygen  Initial Assessment - 05/05/24 1057       Home Oxygen    Home Oxygen  Device Portable Concentrator;Home Concentrator    Sleep Oxygen  Prescription Continuous   pt wears it ocassionally   Liters per minute 4    Home Exercise Oxygen  Prescription Continuous    Liters per minute 4    Home Resting Oxygen  Prescription None    Compliance with Home Oxygen  Use Yes      Initial 6 min Walk   Oxygen  Used None      Program Oxygen  Prescription   Program Oxygen  Prescription None      Intervention   Short Term Goals To learn and exhibit compliance with exercise, home and travel O2 prescription;To learn and understand importance of monitoring SPO2 with pulse oximeter and demonstrate accurate use of the pulse oximeter.;To learn and understand importance of maintaining oxygen  saturations>88%;To learn and demonstrate proper pursed lip breathing techniques or other breathing techniques. ;To learn and demonstrate proper use of respiratory medications    Long  Term Goals Exhibits compliance with exercise, home  and travel O2 prescription;Maintenance of O2 saturations>88%;Compliance with respiratory medication;Verbalizes importance of monitoring SPO2 with pulse oximeter and return demonstration;Exhibits proper breathing techniques, such as pursed lip breathing or other method taught during program session;Demonstrates proper use of MDI's          Oxygen  Re-Evaluation:   Oxygen  Discharge (Final Oxygen  Re-Evaluation):   Initial Exercise Prescription:  Initial Exercise Prescription - 05/05/24 1100       Date of Initial Exercise RX and Referring Provider   Date 05/05/24    Referring Provider Ramaswamy     Expected Discharge Date 08/01/24      Treadmill   MPH 1.5    Grade 0    Minutes 15    METs 2      NuStep   Level --    SPM --    Minutes --    METs --      Recumbant Elliptical   Level 1    RPM 60    Watts 80    Minutes 15    METs 2      Prescription Details   Frequency (times per week) 2    Duration Progress to 30 minutes of continuous aerobic without signs/symptoms of physical distress      Intensity   THRR 40-80% of Max Heartrate 58-116    Ratings of Perceived Exertion 11-13    Perceived Dyspnea 0-4      Progression   Progression Continue to progress workloads to maintain intensity without signs/symptoms of physical distress.      Resistance Training   Training Prescription Yes    Weight red bands    Reps 10-15          Perform Capillary Blood Glucose checks as needed.  Exercise Prescription Changes:   Exercise Comments:   Exercise Goals and Review:   Exercise Goals     Row Name 05/05/24 1029  Exercise Goals   Increase Physical Activity Yes       Intervention Provide advice, education, support and counseling about physical activity/exercise needs.;Develop an individualized exercise prescription for aerobic and resistive training based on initial evaluation findings, risk stratification, comorbidities and participant's personal goals.       Expected Outcomes Short Term: Attend rehab on a regular basis to increase amount of physical activity.;Long Term: Add in home exercise to make exercise part of routine and to increase amount of physical activity.;Long Term: Exercising regularly at least 3-5 days a week.       Increase Strength and Stamina Yes       Intervention Provide advice, education, support and counseling about physical activity/exercise needs.;Develop an individualized exercise prescription for aerobic and resistive training based on initial evaluation findings, risk stratification, comorbidities and participant's personal  goals.       Expected Outcomes Short Term: Increase workloads from initial exercise prescription for resistance, speed, and METs.;Short Term: Perform resistance training exercises routinely during rehab and add in resistance training at home;Long Term: Improve cardiorespiratory fitness, muscular endurance and strength as measured by increased METs and functional capacity ( )       Able to understand and use rate of perceived exertion (RPE) scale Yes       Intervention Provide education and explanation on how to use RPE scale       Expected Outcomes Short Term: Able to use RPE daily in rehab to express subjective intensity level;Long Term:  Able to use RPE to guide intensity level when exercising independently       Able to understand and use Dyspnea scale Yes       Intervention Provide education and explanation on how to use Dyspnea scale       Expected Outcomes Short Term: Able to use Dyspnea scale daily in rehab to express subjective sense of shortness of breath during exertion;Long Term: Able to use Dyspnea scale to guide intensity level when exercising independently       Knowledge and understanding of Target Heart Rate Range (THRR) Yes       Intervention Provide education and explanation of THRR including how the numbers were predicted and where they are located for reference       Expected Outcomes Short Term: Able to state/look up THRR;Long Term: Able to use THRR to govern intensity when exercising independently;Short Term: Able to use daily as guideline for intensity in rehab       Understanding of Exercise Prescription Yes       Intervention Provide education, explanation, and written materials on patient's individual exercise prescription       Expected Outcomes Short Term: Able to explain program exercise prescription;Long Term: Able to explain home exercise prescription to exercise independently          Exercise Goals Re-Evaluation :   Discharge Exercise Prescription (Final  Exercise Prescription Changes):   Nutrition:  Target Goals: Understanding of nutrition guidelines, daily intake of sodium 1500mg , cholesterol 200mg , calories 30% from fat and 7% or less from saturated fats, daily to have 5 or more servings of fruits and vegetables.  Biometrics:    Nutrition Therapy Plan and Nutrition Goals:   Nutrition Assessments:  MEDIFICTS Score Key: >=70 Need to make dietary changes  40-70 Heart Healthy Diet <= 40 Therapeutic Level Cholesterol Diet   Picture Your Plate Scores: <16 Unhealthy dietary pattern with much room for improvement. 41-50 Dietary pattern unlikely to meet recommendations for good health and room for  improvement. 51-60 More healthful dietary pattern, with some room for improvement.  >60 Healthy dietary pattern, although there may be some specific behaviors that could be improved.    Nutrition Goals Re-Evaluation:   Nutrition Goals Discharge (Final Nutrition Goals Re-Evaluation):   Psychosocial: Target Goals: Acknowledge presence or absence of significant depression and/or stress, maximize coping skills, provide positive support system. Participant is able to verbalize types and ability to use techniques and skills needed for reducing stress and depression.  Initial Review & Psychosocial Screening:  Initial Psych Review & Screening - 05/05/24 1047       Initial Review   Current issues with Current Anxiety/Panic;Current Stress Concerns    Source of Stress Concerns Chronic Illness;Unable to participate in former interests or hobbies    Comments [name] states that she is currently experiencing anxiety/stress over the decision to have a lung transplant or not. She is going to meet with the lung transplnt team in a couple of weeks. She also states she keeps her granddaughter and worries about catching colds and viruses from her.      Family Dynamics   Good Support System? Yes      Barriers   Psychosocial barriers to participate in  program The patient should benefit from training in stress management and relaxation.      Screening Interventions   Interventions Encouraged to exercise    Expected Outcomes Short Term goal: Utilizing psychosocial counselor, staff and physician to assist with identification of specific Stressors or current issues interfering with healing process. Setting desired goal for each stressor or current issue identified.;Long Term Goal: Stressors or current issues are controlled or eliminated.          Quality of Life Scores:  Scores of 19 and below usually indicate a poorer quality of life in these areas.  A difference of  2-3 points is a clinically meaningful difference.  A difference of 2-3 points in the total score of the Quality of Life Index has been associated with significant improvement in overall quality of life, self-image, physical symptoms, and general health in studies assessing change in quality of life.  PHQ-9: Review Flowsheet  More data exists      05/05/2024 02/20/2020 09/03/2017 08/21/2015 03/04/2015  Depression screen PHQ 2/9  Decreased Interest 0 0 0 0 0  Down, Depressed, Hopeless 1 0 0 0 0  PHQ - 2 Score 1 0 0 0 0  Altered sleeping 0 0 - - -  Tired, decreased energy 1 0 - - -  Change in appetite 0 0 - - -  Feeling bad or failure about yourself  0 0 - - -  Trouble concentrating 1 0 - - -  Moving slowly or fidgety/restless 0 0 - - -  Suicidal thoughts 0 0 - - -  PHQ-9 Score 3 0 - - -  Difficult doing work/chores Not difficult at all Not difficult at all - - -   Interpretation of Total Score  Total Score Depression Severity:  1-4 = Minimal depression, 5-9 = Mild depression, 10-14 = Moderate depression, 15-19 = Moderately severe depression, 20-27 = Severe depression   Psychosocial Evaluation and Intervention:  Psychosocial Evaluation - 05/05/24 1151       Psychosocial Evaluation & Interventions   Interventions Stress management education;Relaxation  education;Encouraged to exercise with the program and follow exercise prescription    Comments [name] states that she is currently experiencing anxiety/stress over the decision to have a lung transplant or not. She is going  to meet with the lung transplnt team in a couple of weeks. She also states she keeps her granddaughter and worries about catching colds and viruses from her. She denies needing any psychotropic meds or referrals to a mental health specialist at this time.    Expected Outcomes [name] will have less stress and anxiety during PR    Continue Psychosocial Services  No Follow up required          Psychosocial Re-Evaluation:   Psychosocial Discharge (Final Psychosocial Re-Evaluation):   Education: Education Goals: Education classes will be provided on a weekly basis, covering required topics. Participant will state understanding/return demonstration of topics presented.  Learning Barriers/Preferences:  Learning Barriers/Preferences - 05/05/24 1054       Learning Barriers/Preferences   Learning Barriers Sight   vision is sometimes blurry   Learning Preferences Individual Instruction;Skilled Demonstration;Verbal Instruction          Education Topics: Know Your Numbers Group instruction that is supported by a PowerPoint presentation. Instructor discusses importance of knowing and understanding resting, exercise, and post-exercise oxygen  saturation, heart rate, and blood pressure. Oxygen  saturation, heart rate, blood pressure, rating of perceived exertion, and dyspnea are reviewed along with a normal range for these values.    Exercise for the Pulmonary Patient Group instruction that is supported by a PowerPoint presentation. Instructor discusses benefits of exercise, core components of exercise, frequency, duration, and intensity of an exercise routine, importance of utilizing pulse oximetry during exercise, safety while exercising, and options of places to exercise outside of  rehab.  Flowsheet Row PULMONARY REHAB OTHER RESPIRATORY from 01/13/2018 in [facility]  Date 01/13/18  Educator EP  Instruction Review Code 1- Verbalizes Understanding    MET Level  Group instruction provided by PowerPoint, verbal discussion, and written material to support subject matter. Instructor reviews what METs are and how to increase METs.    Pulmonary Medications Verbally interactive group education provided by instructor with focus on inhaled medications and proper administration.   Anatomy and Physiology of the Respiratory System Group instruction provided by PowerPoint, verbal discussion, and written material to support subject matter. Instructor reviews respiratory cycle and anatomical components of the respiratory system and their functions. Instructor also reviews differences in obstructive and restrictive respiratory diseases with examples of each.  Flowsheet Row PULMONARY REHAB OTHER RESPIRATORY from 04/18/2020 in [facility] for Heart, Vascular, & Lung Health  Date 04/02/20  Educator Handout    Oxygen  Safety Group instruction provided by PowerPoint, verbal discussion, and written material to support subject matter. There is an overview of "What is Oxygen " and "Why do we need it".  Instructor also reviews how to create a safe environment for oxygen  use, the importance of using oxygen  as prescribed, and the risks of noncompliance. There is a brief discussion on traveling with oxygen  and resources the patient may utilize. Flowsheet Row PULMONARY REHAB OTHER RESPIRATORY from 01/13/2018 in [facility] for Heart, Vascular, & Lung Health  Date 12/30/17  Educator RN  Instruction Review Code 2- Demonstrated Understanding    Oxygen  Use Group instruction provided by PowerPoint, verbal discussion, and written material to discuss how supplemental oxygen  is prescribed and  different types of oxygen  supply systems. Resources for more information are provided.    Breathing Techniques Group instruction that is supported by demonstration and informational handouts. Instructor discusses the benefits of pursed lip and diaphragmatic breathing and detailed demonstration on how to  perform both.     Risk Factor Reduction Group instruction that is supported by a PowerPoint presentation. Instructor discusses the definition of a risk factor, different risk factors for pulmonary disease, and how the heart and lungs work together. Flowsheet Row PULMONARY REHAB OTHER RESPIRATORY from 04/18/2020 in [facility] for Heart, Vascular, & Lung Health  Date 02/29/20  [04/18/2020]  Educator DF  [Handout Meditation]  Instruction Review Code 2- Demonstrated Understanding    Pulmonary Diseases Group instruction provided by PowerPoint, verbal discussion, and written material to support subject matter. Instructor gives an overview of the different type of pulmonary diseases. There is also a discussion on risk factors and symptoms as well as ways to manage the diseases.   Stress and Energy Conservation Group instruction provided by PowerPoint, verbal discussion, and written material to support subject matter. Instructor gives an overview of stress and the impact it can have on the body. Instructor also reviews ways to reduce stress. There is also a discussion on energy conservation and ways to conserve energy throughout the day.   Warning Signs and Symptoms Group instruction provided by PowerPoint, verbal discussion, and written material to support subject matter. Instructor reviews warning signs and symptoms of stroke, heart attack, cold and flu. Instructor also reviews ways to prevent the spread of infection.   Other Education Group or individual verbal, written, or video instructions that support the educational goals of the pulmonary rehab program. Flowsheet  Row PULMONARY REHAB OTHER RESPIRATORY from 04/18/2020 in [facility] for Heart, Vascular, & Lung Health  Date 04/23/20  [facility] level]  Educator Alen Husbands  Instruction Review Code 2- Demonstrated Understanding     Knowledge Questionnaire Score:  Knowledge Questionnaire Score - 05/05/24 1146       Knowledge Questionnaire Score   Pre Score 17/18          Core Components/Risk Factors/Patient Goals at Admission:  Personal Goals and Risk Factors at Admission - 05/05/24 1055       Core Components/Risk Factors/Patient Goals on Admission    Weight Management Weight Loss;Yes    Intervention Weight Management: Develop a combined nutrition and exercise program designed to reach desired caloric intake, while maintaining appropriate intake of nutrient and fiber, sodium and fats, and appropriate energy expenditure required for the weight goal.;Weight Management: Provide education and appropriate resources to help participant work on and attain dietary goals.;Weight Management/Obesity: Establish reasonable short term and long term weight goals.;Obesity: Provide education and appropriate resources to help participant work on and attain dietary goals.    Expected Outcomes Short Term: Continue to assess and modify interventions until short term weight is achieved;Long Term: Adherence to nutrition and physical activity/exercise program aimed toward attainment of established weight goal;Weight Loss: Understanding of general recommendations for a balanced deficit meal plan, which promotes 1-2 lb weight loss per week and includes a negative energy balance of [phone] kcal/d;Understanding recommendations for meals to include 15-35% energy as protein, 25-35% energy from fat, 35-60% energy from carbohydrates, less than 200mg  of dietary cholesterol, 20-35 gm of total fiber daily;Understanding of distribution of calorie intake throughout the day with the consumption of 4-5 meals/snacks    Improve  shortness of breath with ADL's Yes    Intervention Provide education, individualized exercise plan and daily activity instruction to help decrease symptoms of SOB with activities of daily living.    Expected Outcomes Short Term: Improve cardiorespiratory fitness to achieve a reduction of symptoms when performing ADLs;Long Term: Be able to  perform more ADLs without symptoms or delay the onset of symptoms    Increase knowledge of respiratory medications and ability to use respiratory devices properly  Yes    Intervention Provide education and demonstration as needed of appropriate use of medications, inhalers, and oxygen  therapy.    Expected Outcomes Short Term: Achieves understanding of medications use. Understands that oxygen  is a medication prescribed by physician. Demonstrates appropriate use of inhaler and oxygen  therapy.;Long Term: Maintain appropriate use of medications, inhalers, and oxygen  therapy.          Core Components/Risk Factors/Patient Goals Review:    Core Components/Risk Factors/Patient Goals at Discharge (Final Review):    ITP Comments:   Comments: Dr. Genetta Kenning is Medical Director for Pulmonary Rehab at [facility].

## 2024-05-05 NOTE — Progress Notes (Signed)
 Rhonda [name] 73 y.o. female Pulmonary Rehab Orientation Note This patient who was referred to Pulmonary Rehab by Dr. Bertrum Brodie with the diagnosis of ILD arrived today in Cardiac and Pulmonary Rehab. She  arrived ambulatory with normal gait. She  does carry portable oxygen . Rhonda [name] is the provider for their DME. Per patient, Rhonda [name] uses oxygen  occasionally. Color good, skin warm and dry. Patient is oriented to time and place. Patient's medical history, psychosocial health, and medications reviewed. Psychosocial assessment reveals patient lives with spouse. Rhonda [name] is currently retired. Patient hobbies include spending time with others, reading, and gardening. Patient reports her stress level is low. Areas of stress/anxiety include health. Patient does not exhibit signs of depression. PHQ2/9 score 1/3. Rhonda [name] shows good  coping skills with positive outlook on life. Offered emotional support and reassurance. Will continue to monitor. Physical assessment performed by Nurse pick: Rhonda Aland RN. Please see their orientation physical assessment note. Rhonda [name] reports she does take medications as prescribed. Patient states she would like to lose weight. Patient's weight will be monitored closely. Demonstration and practice of PLB using pulse oximeter. Rhonda [name] able to return demonstration satisfactorily. Safety and hand hygiene in the exercise area reviewed with patient. Rhonda [name] voices understanding of the information reviewed. Department expectations discussed with patient and achievable goals were set. The patient shows enthusiasm about attending the program and we look forward to working with Rhonda [name]. Rhonda [name] completed a 6 min walk test today and is scheduled to begin exercise on 05/11/24 @10 :15am.   [id number] Rhonda [name], BSRT

## 2024-05-05 NOTE — Progress Notes (Signed)
 Pulmonary Rehab Orientation Physical Assessment Note    Well appearing, A&Ox4, NAD Eyes/Ears:  wears corrective lens and states she has some hearing loss.  Has an appt to see audiologist Lungs: Assessment completed by Cindra Cree RT however crackles heard on auscultation of the heart Heart: Regular rate rhythm, no murmurs, no rubs, no clicks Gastrointestinal: abdomin soft, + bowel sounds in all 4 quads, denies recent weight gain or loss, endorses frequent BMs related to ILD medication therapy Genitourinary: WNL, pt denies s/s Extremities:  +2 pulses, grip strength equal, strong, no edema, no cyanosis, no clubbing Integumentary: pt denies any rashes, open or non healing wounds Psy/Soc: Hopeful that Pulmonary rehab will give her more energy and endurance.  Has supportive family. Assistive devices: None Herbie Lehrmann Industrial/product designer, BSN Cardiac and Emergency planning/management officer

## 2024-05-08 NOTE — Telephone Encounter (Signed)
 I thought there is standing orders.  Please check the computer I believe there is standing orders but I have read that this.  Please ask her to go to the [place] lab because it is closer to her house and also because of staffing issues here

## 2024-05-08 NOTE — Telephone Encounter (Signed)
**Note De-identified  [place] Obfuscation** Please advise 

## 2024-05-09 ENCOUNTER — Telehealth: Payer: Self-pay

## 2024-05-09 ENCOUNTER — Other Ambulatory Visit (HOSPITAL_COMMUNITY)

## 2024-05-09 DIAGNOSIS — Z79899 Other long term (current) drug therapy: Secondary | ICD-10-CM

## 2024-05-09 NOTE — Telephone Encounter (Signed)
   LABS  -ok, Creat sligthly higher than in past  Plan  -repeat bmet in 2 weeks   LABS    PULMONARY No results for input(s): PHART, PCO2ART, PO2ART, HCO3, TCO2, O2SAT in the last 168 hours.  Invalid input(s): PCO2, PO2  CBC Recent Labs  Lab 05/04/24 1315  HGB 14.2  HCT 44.6  WBC 15.4*  PLT 232    COAGULATION No results for input(s): INR in the last 168 hours.  CARDIAC  No results for input(s): TROPONINI in the last 168 hours. No results for input(s): PROBNP in the last 168 hours.   CHEMISTRY Recent Labs  Lab 05/04/24 1315  NA 141  K 4.6  CL 104  CO2 24  GLUCOSE 137*  BUN 36*  CREATININE 1.12*  CALCIUM  9.5   Estimated Creatinine Clearance: 35.8 mL/min (A) (by C-G formula based on SCr of 1.12 mg/dL (H)).   LIVER Recent Labs  Lab 05/04/24 1315  AST 18  ALT 23  BILITOT 0.4  PROT 6.1     INFECTIOUS No results for input(s): LATICACIDVEN, PROCALCITON in the last 168 hours.   ENDOCRINE CBG (last 3)  No results for input(s): GLUCAP in the last 72 hours.       IMAGING x48h  - image(s) personally visualized  -   highlighted in bold No results found.

## 2024-05-09 NOTE — Telephone Encounter (Addendum)
 Copied from CRM [id number]. Topic: Clinical - Medication Question >> May 09, 2024  3:19 PM Rhonda [name] wrote: Reason for CRM: Patient has questions about how to take the medication Cellcept  & would like to confirm if she should start Bactrim .Requesting [name] call back at [phone].  Called and spoke with patient and she had labs drawn on 6/12. Dr. Bertrum Brodie please see previous encounter with Mychart message for more information. Can you please advise

## 2024-05-10 ENCOUNTER — Other Ambulatory Visit (HOSPITAL_COMMUNITY): Payer: Self-pay

## 2024-05-10 ENCOUNTER — Ambulatory Visit (HOSPITAL_COMMUNITY): Payer: Self-pay

## 2024-05-10 NOTE — Telephone Encounter (Signed)
 MR, please advise on mychart msg:  Ok. I'll do labs there in 2 weeks. Do I double the z Cellcept  and start the Bactrim  now. That's my real question.

## 2024-05-10 NOTE — Addendum Note (Signed)
 Addended by: Calia Napp M on: 05/10/2024 10:36 AM   Modules accepted: Orders

## 2024-05-10 NOTE — Telephone Encounter (Signed)
 I called the pt and there was no answer. Her mailbox was full, so unable to leave msg. I have communicated with her via mychart msg.

## 2024-05-11 ENCOUNTER — Encounter (HOSPITAL_COMMUNITY)
Admission: RE | Admit: 2024-05-11 | Discharge: 2024-05-11 | Disposition: A | Discharge status: Home / Self Care | Source: Ambulatory Visit | Attending: Internal Medicine

## 2024-05-11 VITALS — Wt 147.5 lb

## 2024-05-11 DIAGNOSIS — J849 Interstitial pulmonary disease, unspecified: Secondary | ICD-10-CM

## 2024-05-11 LAB — GLUCOSE, CAPILLARY
Glucose-Capillary: 207 mg/dL — ABNORMAL HIGH (ref 70–99)
Glucose-Capillary: 166 mg/dL — ABNORMAL HIGH (ref 70–99)

## 2024-05-11 NOTE — Progress Notes (Signed)
 Daily Session Note  Patient Details  Name: Rhonda [name] MRN: [id number] Date of Birth: 03/11/1951 Referring Provider:   Gattis Kass Pulmonary Rehab Walk Test from 05/05/2024 in [facility] for Heart, Vascular, & Lung Health  Referring Provider Ramaswamy    Encounter Date: 05/11/2024  Check In:  Session Check In - 05/11/24 1030       Check-In   Supervising physician immediately available to respond to emergencies [facility] MD immediately available    Physician(s) Lawana Pray, NP    Location [facility]    Staff Present Atlas Lea, MS, ACSM-CEP, Exercise Physiologist;Casey Carmen Chol, RN, BSN;Randi Reeve BS, ACSM-CEP, Exercise Physiologist    Virtual Visit No    Medication changes reported     No    Comments --    Fall or balance concerns reported    No    Tobacco Cessation No Change    Warm-up and Cool-down Performed as group-led instruction    Resistance Training Performed Yes    VAD Patient? No    PAD/SET Patient? No      Pain Assessment   Currently in Pain? No/denies    Multiple Pain Sites No          Capillary Blood Glucose: Results for orders placed or performed during the hospital encounter of 05/05/24 (from the past 24 hours)  Glucose, capillary     Status: Abnormal   Collection Time: 05/11/24 10:22 AM  Result Value Ref Range   Glucose-Capillary 207 (H) 70 - 99 mg/dL   *Note: Due to a large number of results and/or encounters for the requested time period, some results have not been displayed. A complete set of results can be found in Results Review.      Social History   Tobacco Use  Smoking Status Never  Smokeless Tobacco Never  Tobacco Comments   pt states she experimented in college    Goals Met:  Proper associated with RPD/PD & O2 Sat Exercise tolerated well No report of concerns or symptoms today Strength training completed today  Goals Unmet:  Not Applicable  Comments: Service time  is from 1015 to 1145.  Dr. Genetta Kenning is Medical Director for Pulmonary Rehab at [facility].

## 2024-05-11 NOTE — Telephone Encounter (Signed)
 1) do not increase the CellCept  2) but stop the Bactrim  3) drink plenty of fluids 4) recheck labs in 1-2 weeks [slight change in plan, moving this forward then 2 weeks) 5) let her know to also stay in touch with me via text and I am out of office July 9 through the 15th so text will be the best way to reach me

## 2024-05-12 ENCOUNTER — Other Ambulatory Visit (INDEPENDENT_AMBULATORY_CARE_PROVIDER_SITE_OTHER)

## 2024-05-12 ENCOUNTER — Ambulatory Visit (HOSPITAL_COMMUNITY)
Admission: RE | Admit: 2024-05-12 | Discharge: 2024-05-12 | Disposition: A | Discharge status: Home / Self Care | Source: Ambulatory Visit | Attending: Internal Medicine | Admitting: Internal Medicine

## 2024-05-12 ENCOUNTER — Other Ambulatory Visit: Payer: Self-pay | Admitting: Internal Medicine

## 2024-05-12 ENCOUNTER — Other Ambulatory Visit (HOSPITAL_COMMUNITY)

## 2024-05-12 ENCOUNTER — Other Ambulatory Visit: Payer: Self-pay

## 2024-05-12 DIAGNOSIS — I083 Combined rheumatic disorders of mitral, aortic and tricuspid valves: Secondary | ICD-10-CM | POA: Diagnosis not present

## 2024-05-12 DIAGNOSIS — R5381 Other malaise: Secondary | ICD-10-CM | POA: Diagnosis present

## 2024-05-12 DIAGNOSIS — J9611 Chronic respiratory failure with hypoxia: Secondary | ICD-10-CM | POA: Insufficient documentation

## 2024-05-12 DIAGNOSIS — Z79899 Other long term (current) drug therapy: Secondary | ICD-10-CM

## 2024-05-12 DIAGNOSIS — R0609 Other forms of dyspnea: Secondary | ICD-10-CM

## 2024-05-12 DIAGNOSIS — J679 Hypersensitivity pneumonitis due to unspecified organic dust: Secondary | ICD-10-CM | POA: Diagnosis not present

## 2024-05-12 DIAGNOSIS — Z09 Encounter for follow-up examination after completed treatment for conditions other than malignant neoplasm: Secondary | ICD-10-CM | POA: Diagnosis not present

## 2024-05-12 DIAGNOSIS — B348 Other viral infections of unspecified site: Secondary | ICD-10-CM | POA: Insufficient documentation

## 2024-05-12 DIAGNOSIS — Z5181 Encounter for therapeutic drug level monitoring: Secondary | ICD-10-CM

## 2024-05-12 DIAGNOSIS — J849 Interstitial pulmonary disease, unspecified: Secondary | ICD-10-CM | POA: Diagnosis not present

## 2024-05-12 DIAGNOSIS — Z8709 Personal history of other diseases of the respiratory system: Secondary | ICD-10-CM | POA: Diagnosis not present

## 2024-05-12 DIAGNOSIS — D849 Immunodeficiency, unspecified: Secondary | ICD-10-CM | POA: Diagnosis not present

## 2024-05-12 LAB — ECHOCARDIOGRAM COMPLETE BUBBLE STUDY
AR max vel: 1.88 cm2
AV Area VTI: 1.87 cm2
AV Area mean vel: 1.83 cm2
AV Mean grad: 4 mmHg
AV Peak grad: 7.5 mmHg
Ao pk vel: 1.37 m/s
Area-P 1/2: 3.77 cm2
P 1/2 time: 580 ms
S' Lateral: 2.8 cm

## 2024-05-12 LAB — HEPATIC FUNCTION PANEL
ALT: 22 U/L (ref 0–35)
AST: 19 U/L (ref 0–37)
Albumin: 3.7 g/dL (ref 3.5–5.2)
Alkaline Phosphatase: 39 U/L (ref 39–117)
Bilirubin, Direct: 0.1 mg/dL (ref 0.0–0.3)
Total Bilirubin: 0.5 mg/dL (ref 0.2–1.2)
Total Protein: 6.3 g/dL (ref 6.0–8.3)

## 2024-05-12 LAB — CBC WITH DIFFERENTIAL/PLATELET
Basophils Absolute: 0.1 10*3/uL (ref 0.0–0.1)
Basophils Relative: 0.6 % (ref 0.0–3.0)
Eosinophils Absolute: 0.3 10*3/uL (ref 0.0–0.7)
Eosinophils Relative: 1.7 % (ref 0.0–5.0)
HCT: 43.1 % (ref 36.0–46.0)
Hemoglobin: 13.7 g/dL (ref 12.0–15.0)
Lymphocytes Relative: 16.1 % (ref 12.0–46.0)
Lymphs Abs: 2.6 10*3/uL (ref 0.7–4.0)
MCHC: 31.9 g/dL (ref 30.0–36.0)
MCV: 84.9 fl (ref 78.0–100.0)
Monocytes Absolute: 1.2 10*3/uL — ABNORMAL HIGH (ref 0.1–1.0)
Monocytes Relative: 7.3 % (ref 3.0–12.0)
Neutro Abs: 12.1 10*3/uL — ABNORMAL HIGH (ref 1.4–7.7)
Neutrophils Relative %: 74.3 % (ref 43.0–77.0)
Platelets: 220 10*3/uL (ref 150.0–400.0)
RBC: 5.08 Mil/uL (ref 3.87–5.11)
RDW: 20.3 % — ABNORMAL HIGH (ref 11.5–15.5)
WBC: 16.3 10*3/uL — ABNORMAL HIGH (ref 4.0–10.5)

## 2024-05-12 LAB — BASIC METABOLIC PANEL WITH GFR
BUN: 25 mg/dL — ABNORMAL HIGH (ref 6–23)
CO2: 27 meq/L (ref 19–32)
Calcium: 9.1 mg/dL (ref 8.4–10.5)
Chloride: 102 meq/L (ref 96–112)
Creatinine, Ser: 1 mg/dL (ref 0.40–1.20)
GFR: 55.23 mL/min — ABNORMAL LOW (ref >60.00)
Glucose, Bld: 176 mg/dL — ABNORMAL HIGH (ref 70–99)
Potassium: 3.7 meq/L (ref 3.5–5.1)
Sodium: 138 meq/L (ref 135–145)

## 2024-05-12 NOTE — Progress Notes (Signed)
*  PRELIMINARY RESULTS* Echocardiogram 2D Echocardiogram has been performed.  Rhonda [name] 05/12/2024, 2:48 PM

## 2024-05-12 NOTE — Telephone Encounter (Signed)
 Shared with the pt via mychart

## 2024-05-15 ENCOUNTER — Telehealth: Payer: Self-pay

## 2024-05-15 ENCOUNTER — Telehealth: Payer: Self-pay | Admitting: Internal Medicine

## 2024-05-15 DIAGNOSIS — J679 Hypersensitivity pneumonitis due to unspecified organic dust: Secondary | ICD-10-CM

## 2024-05-15 DIAGNOSIS — Z79899 Other long term (current) drug therapy: Secondary | ICD-10-CM

## 2024-05-15 DIAGNOSIS — J4530 Mild persistent asthma, uncomplicated: Secondary | ICD-10-CM

## 2024-05-15 DIAGNOSIS — J849 Interstitial pulmonary disease, unspecified: Secondary | ICD-10-CM

## 2024-05-15 NOTE — Telephone Encounter (Addendum)
 Pharmacy team will start Dupixent benefits investigation  Called patient to discuss all of Dr. Reeves points below:  Advised her to continue prednisone  60mg  daily x 2 weeks and then 40mg  per day  Patient wondering if cardiology needs to also provide input. She will reach out to cardiologist once back from vacation to address if dizziness could be caused from any of her BP meds.  Future order for BMP placed. If BMP stable can increase Cellcept  to 1000mg  twice daily. Will need to start Bactrim  DS three times weekly for her at that point due to high-dose steroid use.  She is aware that Dr. Geronimo is consulting with ILD colleagues about IV pulse steroid option  Rhonda [name], PharmD, MPH, BCPS, CPP Clinical Pharmacist (Rheumatology and Pulmonology) .

## 2024-05-15 NOTE — Telephone Encounter (Signed)
 MR- please clarify:      05/12/24  3:40 PM I think he meant Start  the Bactrim . I have not been on any Bactrim  since the 21 day course in May. I am Now to take 1 pill  MWF only. Me    05/12/24  1:02 PM Note Shared with the pt via mychart     Me to Sharlet DELENA Mare [name]     05/12/24  1:02 PM 1) do not increase the CellCept  2) but stop the Bactrim  3) drink plenty of fluids 4) recheck labs in 1-2 weeks [slight change in plan, moving this forward then 2 weeks) 5) let her know to also stay in touch with me via text and I am out of office July 9 through the 15th so text will be the best way to reach me     Last read by Sharlet DELENA Mare Holley at 9:18PM on 05/14/2024.

## 2024-05-15 NOTE — Telephone Encounter (Signed)
 Yes take bactrim  1DS on MWF

## 2024-05-15 NOTE — Telephone Encounter (Addendum)
 Rhonda [name]   I got a call from her on Saturday, May 13, 2024 reporting worsening hypoxemia she lowered her prednisone  down to 10 mg/day.  She was on CellCept  5 mg twice daily.  She was saying she was desaturating to the 70s even on 4 L oxygen  with just minimal exertion.  I advised her to go to the ER but she had a beach trip plan starting Sunday, May 14, 2024.  Therefore we took a shared decision making that she would just increase her prednisone  to 60 mg/day.  Later in the evening she calls and also she called the following morning May 14, 2024 [yesterday Sunday] reporting stability in her improvement in pulse ox levels.  The assessment here is that over time she is needed increasing baseline doses of steroids and without this she goes into exacerbation and also hypoxemia and with higher dose of steroids as she always improves and pretty fast.  This definitely is steroid responsiveness either from active inflammation related to HP or an allergic phenotype with asthma [review of the labs indicate high eosinophils greater than 300 even with the prednisone , she is also seen allergist and she has been on allergy shots which are currently on hold following the passing away of her allergist]/this is as recently as May 12, 2024 1 day before her call  Also repeat labs show slight elevation BUN  Plan - I advised her to stay on 60mg  prednisone  all this week - if doing well then in 2 weeks will let her reduce to 40mg  per day   - I recommend she start on Dupixent for asthma allergic phenotype (Rhonda [name] pls note) - Rhonda [name] pls let her know  -Rhonda [name] -please have her chec BMET in 2 weeks - pls order -> that point we will think about increasing CellCept  -Rhonda [name] please let her know - I did tell her that I would reach out to my other ILD colleagues to see if she would need IV pulse dose steroid -> this is working progress Rhonda [name] please communicate]     LABS    PULMONARY No results for input(s): PHART,  PCO2ART, PO2ART, HCO3, TCO2, O2SAT in the last 168 hours.  Invalid input(s): PCO2, PO2  CBC Recent Labs  Lab 05/12/24 0936  HGB 13.7  HCT 43.1  WBC 16.3*  PLT 220.0    COAGULATION No results for input(s): INR in the last 168 hours.  CARDIAC  No results for input(s): TROPONINI in the last 168 hours. No results for input(s): PROBNP in the last 168 hours.   CHEMISTRY Recent Labs  Lab 05/12/24 0936  NA 138  K 3.7  CL 102  CO2 27  GLUCOSE 176*  BUN 25*  CREATININE 1.00  CALCIUM  9.1   Estimated Creatinine Clearance: 40.1 mL/min (by C-G formula based on SCr of 1 mg/dL).   LIVER Recent Labs  Lab 05/12/24 0936  AST 19  ALT 22  ALKPHOS 39  BILITOT 0.5  PROT 6.3  ALBUMIN 3.7     INFECTIOUS No results for input(s): LATICACIDVEN, PROCALCITON in the last 168 hours.   ENDOCRINE CBG (last 3)  No results for input(s): GLUCAP in the last 72 hours.       IMAGING x48h  - image(s) personally visualized  -   highlighted in bold No results found.

## 2024-05-15 NOTE — Telephone Encounter (Signed)
 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 Will update once we receive a response.  Key: [id number]   Sherry Pennant, PharmD, MPH, BCPS, CPP Clinical Pharmacist (Rheumatology and Pulmonology)

## 2024-05-16 ENCOUNTER — Encounter (HOSPITAL_COMMUNITY)

## 2024-05-18 ENCOUNTER — Encounter (HOSPITAL_COMMUNITY)

## 2024-05-19 ENCOUNTER — Telehealth: Payer: Self-pay | Admitting: Cardiology

## 2024-05-19 ENCOUNTER — Other Ambulatory Visit (HOSPITAL_COMMUNITY): Payer: Self-pay

## 2024-05-19 NOTE — Telephone Encounter (Signed)
 Called patient back about message. Patient would like to discuss her recent echo and heart cath with Dr. Lavona. Patient stated she has lung issues were pulmonology has started her on steroids, and they have decreased and increased her dosage a lot due to her interstitial lung disease. She is wondering what kind of impact this has on her heart. She wants to see Dr. Lavona. Made patient next available appointment with Dr. Lavona.

## 2024-05-19 NOTE — Telephone Encounter (Signed)
 Received notification from [employer] regarding a prior authorization for DUPIXENT. Authorization has been APPROVED from 04/25/2024 to 11/22/2024. Approval letter sent to scan center.  Per test claim, copay for 28 days supply is $100  Patient can fill through [facility]: [phone]   Please work with patient to complete patient assistance paperwork if interested

## 2024-05-19 NOTE — Telephone Encounter (Signed)
 Pt c/o Shortness Of Breath: STAT if SOB developed within the last 24 hours or pt is noticeably SOB on the phone  1. Are you currently SOB (can you hear that pt is SOB on the phone)? No   2. How long have you been experiencing SOB? Months   3. Are you SOB when sitting or when up moving around? Both   4. Are you currently experiencing any other symptoms? Fatigue     Pt states Pulmonary has been treating her for Pulmonary fibrosis. She states she has had a couple of test on her heart. She wants to know if any of her conditions have been effecting her heart and if Dr. Lavona has any suggestions for her.

## 2024-05-22 ENCOUNTER — Emergency Department (HOSPITAL_COMMUNITY)

## 2024-05-22 ENCOUNTER — Inpatient Hospital Stay (HOSPITAL_COMMUNITY)
Admission: EM | Admit: 2024-05-22 | Discharge: 2024-05-30 | DRG: 196 | Disposition: A | Discharge status: Home Health | Source: Ambulatory Visit | Attending: Internal Medicine | Admitting: Internal Medicine

## 2024-05-22 ENCOUNTER — Telehealth: Payer: Self-pay | Admitting: Internal Medicine

## 2024-05-22 ENCOUNTER — Other Ambulatory Visit: Payer: Self-pay

## 2024-05-22 ENCOUNTER — Encounter (HOSPITAL_COMMUNITY): Payer: Self-pay

## 2024-05-22 DIAGNOSIS — E876 Hypokalemia: Secondary | ICD-10-CM | POA: Diagnosis not present

## 2024-05-22 DIAGNOSIS — K602 Anal fissure, unspecified: Secondary | ICD-10-CM | POA: Diagnosis not present

## 2024-05-22 DIAGNOSIS — I2699 Other pulmonary embolism without acute cor pulmonale: Secondary | ICD-10-CM | POA: Diagnosis not present

## 2024-05-22 DIAGNOSIS — R682 Dry mouth, unspecified: Secondary | ICD-10-CM | POA: Diagnosis present

## 2024-05-22 DIAGNOSIS — T380X5A Adverse effect of glucocorticoids and synthetic analogues, initial encounter: Secondary | ICD-10-CM | POA: Diagnosis present

## 2024-05-22 DIAGNOSIS — K580 Irritable bowel syndrome with diarrhea: Secondary | ICD-10-CM | POA: Diagnosis present

## 2024-05-22 DIAGNOSIS — I2693 Single subsegmental pulmonary embolism without acute cor pulmonale: Secondary | ICD-10-CM | POA: Diagnosis present

## 2024-05-22 DIAGNOSIS — D125 Benign neoplasm of sigmoid colon: Secondary | ICD-10-CM | POA: Diagnosis present

## 2024-05-22 DIAGNOSIS — Z85828 Personal history of other malignant neoplasm of skin: Secondary | ICD-10-CM

## 2024-05-22 DIAGNOSIS — I341 Nonrheumatic mitral (valve) prolapse: Secondary | ICD-10-CM | POA: Diagnosis present

## 2024-05-22 DIAGNOSIS — Z7984 Long term (current) use of oral hypoglycemic drugs: Secondary | ICD-10-CM | POA: Diagnosis not present

## 2024-05-22 DIAGNOSIS — I2510 Atherosclerotic heart disease of native coronary artery without angina pectoris: Secondary | ICD-10-CM | POA: Diagnosis present

## 2024-05-22 DIAGNOSIS — Z91013 Allergy to seafood: Secondary | ICD-10-CM

## 2024-05-22 DIAGNOSIS — K648 Other hemorrhoids: Secondary | ICD-10-CM | POA: Diagnosis present

## 2024-05-22 DIAGNOSIS — D124 Benign neoplasm of descending colon: Secondary | ICD-10-CM | POA: Diagnosis present

## 2024-05-22 DIAGNOSIS — J679 Hypersensitivity pneumonitis due to unspecified organic dust: Secondary | ICD-10-CM | POA: Diagnosis present

## 2024-05-22 DIAGNOSIS — Z79899 Other long term (current) drug therapy: Secondary | ICD-10-CM

## 2024-05-22 DIAGNOSIS — E8881 Metabolic syndrome: Secondary | ICD-10-CM

## 2024-05-22 DIAGNOSIS — Z860100 Personal history of colon polyps, unspecified: Secondary | ICD-10-CM

## 2024-05-22 DIAGNOSIS — I10 Essential (primary) hypertension: Secondary | ICD-10-CM | POA: Diagnosis present

## 2024-05-22 DIAGNOSIS — J984 Other disorders of lung: Secondary | ICD-10-CM | POA: Diagnosis present

## 2024-05-22 DIAGNOSIS — H539 Unspecified visual disturbance: Secondary | ICD-10-CM | POA: Diagnosis not present

## 2024-05-22 DIAGNOSIS — K649 Unspecified hemorrhoids: Secondary | ICD-10-CM | POA: Insufficient documentation

## 2024-05-22 DIAGNOSIS — Z833 Family history of diabetes mellitus: Secondary | ICD-10-CM

## 2024-05-22 DIAGNOSIS — Z794 Long term (current) use of insulin: Secondary | ICD-10-CM

## 2024-05-22 DIAGNOSIS — D128 Benign neoplasm of rectum: Secondary | ICD-10-CM | POA: Diagnosis not present

## 2024-05-22 DIAGNOSIS — Z6829 Body mass index (BMI) 29.0-29.9, adult: Secondary | ICD-10-CM

## 2024-05-22 DIAGNOSIS — D120 Benign neoplasm of cecum: Secondary | ICD-10-CM | POA: Diagnosis present

## 2024-05-22 DIAGNOSIS — D3610 Benign neoplasm of peripheral nerves and autonomic nervous system, unspecified: Secondary | ICD-10-CM | POA: Diagnosis not present

## 2024-05-22 DIAGNOSIS — R0602 Shortness of breath: Secondary | ICD-10-CM

## 2024-05-22 DIAGNOSIS — K625 Hemorrhage of anus and rectum: Secondary | ICD-10-CM | POA: Diagnosis not present

## 2024-05-22 DIAGNOSIS — Z9109 Other allergy status, other than to drugs and biological substances: Secondary | ICD-10-CM

## 2024-05-22 DIAGNOSIS — Z803 Family history of malignant neoplasm of breast: Secondary | ICD-10-CM

## 2024-05-22 DIAGNOSIS — J4489 Other specified chronic obstructive pulmonary disease: Secondary | ICD-10-CM | POA: Diagnosis present

## 2024-05-22 DIAGNOSIS — K219 Gastro-esophageal reflux disease without esophagitis: Secondary | ICD-10-CM | POA: Diagnosis present

## 2024-05-22 DIAGNOSIS — Z86711 Personal history of pulmonary embolism: Secondary | ICD-10-CM | POA: Diagnosis not present

## 2024-05-22 DIAGNOSIS — Z885 Allergy status to narcotic agent status: Secondary | ICD-10-CM

## 2024-05-22 DIAGNOSIS — Z8249 Family history of ischemic heart disease and other diseases of the circulatory system: Secondary | ICD-10-CM

## 2024-05-22 DIAGNOSIS — J9621 Acute and chronic respiratory failure with hypoxia: Secondary | ICD-10-CM | POA: Diagnosis present

## 2024-05-22 DIAGNOSIS — D127 Benign neoplasm of rectosigmoid junction: Secondary | ICD-10-CM | POA: Diagnosis not present

## 2024-05-22 DIAGNOSIS — Z808 Family history of malignant neoplasm of other organs or systems: Secondary | ICD-10-CM

## 2024-05-22 DIAGNOSIS — R0902 Hypoxemia: Principal | ICD-10-CM

## 2024-05-22 DIAGNOSIS — E119 Type 2 diabetes mellitus without complications: Secondary | ICD-10-CM | POA: Diagnosis not present

## 2024-05-22 DIAGNOSIS — J849 Interstitial pulmonary disease, unspecified: Principal | ICD-10-CM | POA: Diagnosis present

## 2024-05-22 DIAGNOSIS — J9601 Acute respiratory failure with hypoxia: Secondary | ICD-10-CM | POA: Diagnosis present

## 2024-05-22 DIAGNOSIS — J8410 Pulmonary fibrosis, unspecified: Secondary | ICD-10-CM | POA: Diagnosis not present

## 2024-05-22 DIAGNOSIS — Z91048 Other nonmedicinal substance allergy status: Secondary | ICD-10-CM

## 2024-05-22 DIAGNOSIS — K760 Fatty (change of) liver, not elsewhere classified: Secondary | ICD-10-CM | POA: Diagnosis present

## 2024-05-22 DIAGNOSIS — D72829 Elevated white blood cell count, unspecified: Secondary | ICD-10-CM | POA: Diagnosis not present

## 2024-05-22 DIAGNOSIS — D122 Benign neoplasm of ascending colon: Secondary | ICD-10-CM | POA: Diagnosis present

## 2024-05-22 DIAGNOSIS — Z96643 Presence of artificial hip joint, bilateral: Secondary | ICD-10-CM | POA: Diagnosis present

## 2024-05-22 DIAGNOSIS — Z888 Allergy status to other drugs, medicaments and biological substances status: Secondary | ICD-10-CM

## 2024-05-22 DIAGNOSIS — D123 Benign neoplasm of transverse colon: Secondary | ICD-10-CM | POA: Diagnosis present

## 2024-05-22 DIAGNOSIS — Z7951 Long term (current) use of inhaled steroids: Secondary | ICD-10-CM

## 2024-05-22 DIAGNOSIS — Z882 Allergy status to sulfonamides status: Secondary | ICD-10-CM

## 2024-05-22 DIAGNOSIS — Z825 Family history of asthma and other chronic lower respiratory diseases: Secondary | ICD-10-CM

## 2024-05-22 DIAGNOSIS — Z8614 Personal history of Methicillin resistant Staphylococcus aureus infection: Secondary | ICD-10-CM

## 2024-05-22 DIAGNOSIS — Z7901 Long term (current) use of anticoagulants: Secondary | ICD-10-CM | POA: Diagnosis not present

## 2024-05-22 DIAGNOSIS — Z7952 Long term (current) use of systemic steroids: Secondary | ICD-10-CM

## 2024-05-22 DIAGNOSIS — E1165 Type 2 diabetes mellitus with hyperglycemia: Secondary | ICD-10-CM | POA: Diagnosis present

## 2024-05-22 DIAGNOSIS — E782 Mixed hyperlipidemia: Secondary | ICD-10-CM | POA: Diagnosis present

## 2024-05-22 DIAGNOSIS — J209 Acute bronchitis, unspecified: Secondary | ICD-10-CM

## 2024-05-22 DIAGNOSIS — E669 Obesity, unspecified: Secondary | ICD-10-CM | POA: Diagnosis present

## 2024-05-22 DIAGNOSIS — Z8041 Family history of malignant neoplasm of ovary: Secondary | ICD-10-CM

## 2024-05-22 DIAGNOSIS — Z807 Family history of other malignant neoplasms of lymphoid, hematopoietic and related tissues: Secondary | ICD-10-CM

## 2024-05-22 DIAGNOSIS — Z841 Family history of disorders of kidney and ureter: Secondary | ICD-10-CM

## 2024-05-22 DIAGNOSIS — I340 Nonrheumatic mitral (valve) insufficiency: Secondary | ICD-10-CM | POA: Diagnosis not present

## 2024-05-22 DIAGNOSIS — Z79624 Long term (current) use of inhibitors of nucleotide synthesis: Secondary | ICD-10-CM

## 2024-05-22 DIAGNOSIS — Z9981 Dependence on supplemental oxygen: Secondary | ICD-10-CM

## 2024-05-22 LAB — I-STAT CG4 LACTIC ACID, ED
Lactic Acid, Venous: 2.1 mmol/L (ref 0.5–1.9)
Lactic Acid, Venous: 2 mmol/L (ref 0.5–1.9)

## 2024-05-22 LAB — D-DIMER, QUANTITATIVE: D-Dimer, Quant: 0.58 ug{FEU}/mL — ABNORMAL HIGH (ref 0.00–0.50)

## 2024-05-22 LAB — RESPIRATORY PANEL BY PCR

## 2024-05-22 LAB — CBC
HCT: 44.8 % (ref 36.0–46.0)
Hemoglobin: 13.7 g/dL (ref 12.0–15.0)
MCH: 27.7 pg (ref 26.0–34.0)
MCHC: 30.6 g/dL (ref 30.0–36.0)
MCV: 90.5 fL (ref 80.0–100.0)
Platelets: 238 10*3/uL (ref 150–400)
RBC: 4.95 MIL/uL (ref 3.87–5.11)
RDW: 18.7 % — ABNORMAL HIGH (ref 11.5–15.5)
WBC: 15.5 10*3/uL — ABNORMAL HIGH (ref 4.0–10.5)
nRBC: 0.2 % (ref 0.0–0.2)

## 2024-05-22 LAB — BASIC METABOLIC PANEL WITH GFR
Anion gap: 10 (ref 5–15)
BUN: 36 mg/dL — ABNORMAL HIGH (ref 8–23)
CO2: 21 mmol/L — ABNORMAL LOW (ref 22–32)
Calcium: 9.1 mg/dL (ref 8.9–10.3)
Chloride: 106 mmol/L (ref 98–111)
Creatinine, Ser: 0.89 mg/dL (ref 0.44–1.00)
GFR, Estimated: >60 mL/min (ref >60)
Glucose, Bld: 234 mg/dL — ABNORMAL HIGH (ref 70–99)
Potassium: 4.2 mmol/L (ref 3.5–5.1)
Sodium: 137 mmol/L (ref 135–145)

## 2024-05-22 LAB — BRAIN NATRIURETIC PEPTIDE: B Natriuretic Peptide: 89.7 pg/mL (ref 0.0–100.0)

## 2024-05-22 LAB — GLUCOSE, CAPILLARY: Glucose-Capillary: 199 mg/dL — ABNORMAL HIGH (ref 70–99)

## 2024-05-22 LAB — TROPONIN I (HIGH SENSITIVITY)
Troponin I (High Sensitivity): 5 ng/L (ref <18)
Troponin I (High Sensitivity): 4 ng/L (ref <18)

## 2024-05-22 MED ORDER — INSULIN ASPART 100 UNIT/ML IJ SOLN
0.0000 [IU] | Freq: Three times a day (TID) | INTRAMUSCULAR | Status: DC
  Administered 2024-05-22: 4 [IU] via SUBCUTANEOUS
  Administered 2024-05-23: 16 [IU] via SUBCUTANEOUS
  Administered 2024-05-23: 8 [IU] via SUBCUTANEOUS
  Administered 2024-05-24: 2 [IU] via SUBCUTANEOUS
  Administered 2024-05-24 – 2024-05-25 (×3): 12 [IU] via SUBCUTANEOUS
  Administered 2024-05-25: 20 [IU] via SUBCUTANEOUS
  Administered 2024-05-25: 12 [IU] via SUBCUTANEOUS
  Administered 2024-05-26: 4 [IU] via SUBCUTANEOUS
  Administered 2024-05-26: 16 [IU] via SUBCUTANEOUS
  Administered 2024-05-26: 12 [IU] via SUBCUTANEOUS
  Administered 2024-05-27: 2 [IU] via SUBCUTANEOUS
  Administered 2024-05-27 (×2): 8 [IU] via SUBCUTANEOUS
  Administered 2024-05-28: 12 [IU] via SUBCUTANEOUS
  Administered 2024-05-28: 16 [IU] via SUBCUTANEOUS
  Administered 2024-05-29: 12 [IU] via SUBCUTANEOUS
  Administered 2024-05-29: 8 [IU] via SUBCUTANEOUS
  Administered 2024-05-29: 12 [IU] via SUBCUTANEOUS
  Administered 2024-05-30: 8 [IU] via SUBCUTANEOUS

## 2024-05-22 MED ORDER — REVEFENACIN 175 MCG/3ML IN SOLN
175.0000 ug | Freq: Every day | RESPIRATORY_TRACT | Status: DC
  Administered 2024-05-23 – 2024-05-30 (×8): 175 ug via RESPIRATORY_TRACT
  Filled 2024-05-22 (×9): qty 3

## 2024-05-22 MED ORDER — SODIUM CHLORIDE (PF) 0.9 % IJ SOLN
INTRAMUSCULAR | Status: AC
  Filled 2024-05-22: qty 50

## 2024-05-22 MED ORDER — ONDANSETRON HCL 4 MG PO TABS: 4.0000 mg | ORAL_TABLET | Freq: Four times a day (QID) | ORAL | Status: DC | PRN

## 2024-05-22 MED ORDER — EZETIMIBE 10 MG PO TABS
10.0000 mg | ORAL_TABLET | Freq: Every day | ORAL | Status: AC
  Administered 2024-05-22: 10 mg via ORAL
  Filled 2024-05-22: qty 1

## 2024-05-22 MED ORDER — BUDESONIDE 0.25 MG/2ML IN SUSP: 0.2500 mg | Freq: Two times a day (BID) | RESPIRATORY_TRACT | Status: DC

## 2024-05-22 MED ORDER — ACETAMINOPHEN 650 MG RE SUPP: 650.0000 mg | Freq: Four times a day (QID) | RECTAL | Status: DC | PRN

## 2024-05-22 MED ORDER — BUDESONIDE 0.5 MG/2ML IN SUSP
0.5000 mg | Freq: Two times a day (BID) | RESPIRATORY_TRACT | Status: DC
  Administered 2024-05-22 – 2024-05-30 (×16): 0.5 mg via RESPIRATORY_TRACT
  Filled 2024-05-22 (×17): qty 2

## 2024-05-22 MED ORDER — SULFAMETHOXAZOLE-TRIMETHOPRIM 800-160 MG PO TABS
1.0000 | ORAL_TABLET | ORAL | Status: DC
  Administered 2024-05-24 – 2024-05-29 (×3): 1 via ORAL
  Filled 2024-05-22 (×3): qty 1

## 2024-05-22 MED ORDER — ONDANSETRON HCL 4 MG/2ML IJ SOLN
4.0000 mg | Freq: Four times a day (QID) | INTRAMUSCULAR | Status: DC | PRN
  Administered 2024-05-23: 4 mg via INTRAVENOUS

## 2024-05-22 MED ORDER — METOPROLOL SUCCINATE ER 25 MG PO TB24
25.0000 mg | ORAL_TABLET | Freq: Every day | ORAL | Status: DC
  Administered 2024-05-23 – 2024-05-30 (×7): 25 mg via ORAL
  Filled 2024-05-22 (×7): qty 1

## 2024-05-22 MED ORDER — IOHEXOL 350 MG/ML SOLN
75.0000 mL | Freq: Once | INTRAVENOUS | Status: AC | PRN
  Administered 2024-05-22: 75 mL via INTRAVENOUS

## 2024-05-22 MED ORDER — ARFORMOTEROL TARTRATE 15 MCG/2ML IN NEBU
15.0000 ug | INHALATION_SOLUTION | Freq: Two times a day (BID) | RESPIRATORY_TRACT | Status: DC
  Administered 2024-05-22 – 2024-05-30 (×16): 15 ug via RESPIRATORY_TRACT
  Filled 2024-05-22 (×17): qty 2

## 2024-05-22 MED ORDER — ENOXAPARIN SODIUM 60 MG/0.6ML IJ SOSY
50.0000 mg | PREFILLED_SYRINGE | Freq: Once | INTRAMUSCULAR | Status: AC
  Administered 2024-05-22: 50 mg via SUBCUTANEOUS
  Filled 2024-05-22: qty 0.6

## 2024-05-22 MED ORDER — METHYLPREDNISOLONE SODIUM SUCC 125 MG IJ SOLR
125.0000 mg | Freq: Two times a day (BID) | INTRAMUSCULAR | Status: DC
  Administered 2024-05-22 – 2024-05-29 (×13): 125 mg via INTRAVENOUS
  Filled 2024-05-22 (×13): qty 2

## 2024-05-22 MED ORDER — SODIUM CHLORIDE 0.9 % IV SOLN
2.0000 g | Freq: Two times a day (BID) | INTRAVENOUS | Status: DC
  Administered 2024-05-22 – 2024-05-27 (×11): 2 g via INTRAVENOUS
  Filled 2024-05-22 (×11): qty 20

## 2024-05-22 MED ORDER — ENOXAPARIN SODIUM 40 MG/0.4ML IJ SOSY: 40.0000 mg | PREFILLED_SYRINGE | INTRAMUSCULAR | Status: DC

## 2024-05-22 MED ORDER — ACETAMINOPHEN 325 MG PO TABS: 650.0000 mg | ORAL_TABLET | Freq: Four times a day (QID) | ORAL | Status: DC | PRN

## 2024-05-22 MED ORDER — SODIUM CHLORIDE 0.9 % IV SOLN
500.0000 mg | Freq: Every day | INTRAVENOUS | Status: DC
  Administered 2024-05-22 – 2024-05-24 (×3): 500 mg via INTRAVENOUS
  Filled 2024-05-22 (×3): qty 5

## 2024-05-22 MED ORDER — ALBUTEROL SULFATE (2.5 MG/3ML) 0.083% IN NEBU
2.5000 mg | INHALATION_SOLUTION | Freq: Four times a day (QID) | RESPIRATORY_TRACT | Status: DC
  Administered 2024-05-22 – 2024-05-24 (×7): 2.5 mg via RESPIRATORY_TRACT
  Filled 2024-05-22 (×7): qty 3

## 2024-05-22 MED ORDER — MYCOPHENOLATE MOFETIL 250 MG PO CAPS
500.0000 mg | ORAL_CAPSULE | Freq: Two times a day (BID) | ORAL | Status: DC
  Administered 2024-05-22 – 2024-05-25 (×6): 500 mg via ORAL
  Filled 2024-05-22 (×7): qty 2

## 2024-05-22 MED ORDER — DILTIAZEM HCL ER COATED BEADS 120 MG PO CP24
120.0000 mg | ORAL_CAPSULE | Freq: Every day | ORAL | Status: DC
  Administered 2024-05-23 – 2024-05-30 (×7): 120 mg via ORAL
  Filled 2024-05-22 (×7): qty 1

## 2024-05-22 MED ORDER — SULFAMETHOXAZOLE-TRIMETHOPRIM 800-160 MG PO TABS: 1.0000 | ORAL_TABLET | Freq: Every day | ORAL | Status: DC

## 2024-05-22 MED ORDER — PREDNISONE 5 MG PO TABS: 10.0000 mg | ORAL_TABLET | Freq: Every day | ORAL | Status: DC

## 2024-05-22 MED ORDER — PRAVASTATIN SODIUM 40 MG PO TABS
40.0000 mg | ORAL_TABLET | Freq: Every evening | ORAL | Status: DC
  Administered 2024-05-22 – 2024-05-29 (×8): 40 mg via ORAL
  Filled 2024-05-22 (×4): qty 1
  Filled 2024-05-22: qty 2
  Filled 2024-05-22 (×3): qty 1

## 2024-05-22 MED ORDER — INSULIN GLARGINE-YFGN 100 UNIT/ML ~~LOC~~ SOLN
15.0000 [IU] | Freq: Every day | SUBCUTANEOUS | Status: DC
  Administered 2024-05-24 – 2024-05-30 (×6): 15 [IU] via SUBCUTANEOUS
  Filled 2024-05-22 (×8): qty 0.15

## 2024-05-22 MED ORDER — EZETIMIBE 10 MG PO TABS: 10.0000 mg | ORAL_TABLET | Freq: Every day | ORAL | Status: DC

## 2024-05-22 MED ORDER — HEPARIN (PORCINE) 25000 UT/250ML-% IV SOLN
950.0000 [IU]/h | INTRAVENOUS | Status: DC
  Administered 2024-05-23: 950 [IU]/h via INTRAVENOUS
  Filled 2024-05-22: qty 250

## 2024-05-22 NOTE — Consult Note (Signed)
 NAME:  Rhonda [name], MRN:  [id number], DOB:  1950-12-27, LOS: 0 ADMISSION DATE:  05/22/2024, CONSULTATION DATE:  05/22/2024 REFERRING MD:  Dr. Lenor-  EDP, CHIEF COMPLAINT:  Acute on chronic hypoxic respiratory failure    History of Present Illness:  Rhonda [name] is a 73 y.o. female with PMH significant for  hypersensitive pneumonitis related pulmonary fibrosis/interstitial lung disease immunosuppressed at baseline with chronic steroids, CAD, HTN, HLD, and steroids induce diabetes who presented to the ED at the direction of primary pulmonologist for complaints of hypoxic and change in sputum production.   Of note patient had had a progressive decline in respiratory status over that last year, with worst decline seen after suffering a rhinovirus infection in April. After this admission she was started on Prednisone  taper and CellCept , she tolerated this well until her steroids got to 10 of Prednisone  daily. At this time she began to desaturate quickly. He primary pulmonologist (Dr. Geronimo) wished to admit for further management but patient refused and decision was made to increase steroids back to 60mg  daily and symptoms resolved until 6/30 when hypoxic returned and sputum production changed.   Pertinent  Medical History  Hypersensitive pneumonitis related pulmonary fibrosis/interstitial lung disease immunosuppressed at baseline with chronic steroids, CAD, HTN, HLD, and steroids induce diabetes  Significant Hospital Events: Including procedures, antibiotic start and stop dates in addition to other pertinent events   6/30 Presented with more subjective hypoxia with and change in sputum production   Interim History / Subjective:  As above   Objective    Blood pressure (!) 144/85, pulse 78, temperature 98.1 F (36.7 C), temperature source Oral, resp. rate 18, weight 66.2 kg, SpO2 96%.       No intake or output data in the 24 hours ending 05/22/24 1438 Filed Weights   05/22/24 1220   Weight: 66.2 kg    Examination: General: Well appearing elderly female sitting up in bed, in NAD HEENT: [place]/AT, MM pink/moist, PERRL,  Neuro: Alert and oriented x3, non-focal  CV: s1s2 regular rate and rhythm, no murmur, rubs, or gallops,  PULM:  Slightly diminished bilaterally, no increased work of breathing, no added breath sounds  GI: soft, bowel sounds active in all 4 quadrants, non-tender, non-distended, tolerating oral diet Extremities: warm/dry, no edema  Skin: no rashes or lesions  Resolved problem list   Assessment and Plan  Acute on chronic hypoxic respiratory failure with acute concern for ILD flare vs CAP Hypersensitive pneumonitis related pulmonary fibrosis/interstitial lung disease Immunosuppressed at baseline with chronic steroids  -Generally very steroid responsive but recently despite 60mg  po Prednisone  she report easy desaturation with green sputum  P: Continue supplemental oxygen   Pending clinical workup may start inpatient IV Cytoxan and IV Rituxan Increased steroids to 125 IV q12  Head of bed elevated 30 degrees. Ensure adequate pulmonary hygiene  Collect sputum culture via BAL tentatively planned 7/1 Check RVP panel  Triple neb therapy  Empiric CAP coverage  Minimize sedation   Best Practice (right click and Reselect all SmartList Selections daily)  Per primary   Labs   CBC: Recent Labs  Lab 05/22/24 1301  WBC 15.5*  HGB 13.7  HCT 44.8  MCV 90.5  PLT 238    Basic Metabolic Panel: Recent Labs  Lab 05/22/24 1301  NA 137  K 4.2  CL 106  CO2 21*  GLUCOSE 234*  BUN 36*  CREATININE 0.89  CALCIUM  9.1   GFR: Estimated Creatinine Clearance: 45.2 mL/min (by C-G formula  based on SCr of 0.89 mg/dL). Recent Labs  Lab 05/22/24 1301  WBC 15.5*    Liver Function Tests: No results for input(s): AST, ALT, ALKPHOS, BILITOT, PROT, ALBUMIN in the last 168 hours. No results for input(s): LIPASE, AMYLASE in the last 168  hours. No results for input(s): AMMONIA in the last 168 hours.  ABG    Component Value Date/Time   PHART 7.410 09/29/2012 0500   PCO2ART 41.1 09/29/2012 0500   PO2ART 58.7 (L) 09/29/2012 0500   HCO3 21.7 04/04/2024 0953   TCO2 23 04/04/2024 0953   ACIDBASEDEF 2.0 04/04/2024 0953   O2SAT 71 04/04/2024 0953     Coagulation Profile: No results for input(s): INR, PROTIME in the last 168 hours.  Cardiac Enzymes: No results for input(s): CKTOTAL, CKMB, CKMBINDEX, TROPONINI in the last 168 hours.  HbA1C: Hgb A1c MFr Bld  Date/Time Value Ref Range Status  03/15/2024 05:59 AM 7.2 (H) 4.8 - 5.6 % Final    Comment:    (NOTE) Pre diabetes:          5.7%-6.4%  Diabetes:              >6.4%  Glycemic control for   <7.0% adults with diabetes   09/28/2019 12:26 PM 6.2 (H) 4.8 - 5.6 % Final    Comment:    (NOTE) Pre diabetes:          5.7%-6.4% Diabetes:              >6.4% Glycemic control for   <7.0% adults with diabetes     CBG: No results for input(s): GLUCAP in the last 168 hours.  Review of Systems:   Please see the history of present illness. All other systems reviewed and are negative   Past Medical History:  She,  has a past medical history of Allergy, Arthritis, Asthma, Cataract, Coronary artery calcification seen on CAT scan (08/19/2017), DDD (degenerative disc disease), thoracic, Depression, DOE (dyspnea on exertion), Fatty liver, GERD (gastroesophageal reflux disease), H/O steroid therapy, Heart palpitations (02/28/2015), Helicobacter pylori ab+, Hemorrhoids, Hiatal hernia, High cholesterol, History of chronic bronchitis, History of migraine, History of MRSA infection, Hyperlipidemia, mixed (08/19/2017), Hyperplastic colon polyp (2007), IBS (irritable bowel syndrome), Inguinal hernia, Insulin  resistance, Interstitial lung disease (HCC), MVP (mitral valve prolapse), Pneumonia, Pneumonitis, hypersensitivity (HCC), PONV (postoperative nausea and vomiting),  Pre-diabetes, Pulmonary fibrosis (HCC), PVC (premature ventricular contraction) (08/19/2017), Rapid heart rate, Squamous cell carcinoma of skin, Tinnitus, right ear, and Vocal cord ulcer.   Surgical History:   Past Surgical History:  Procedure Laterality Date   17 HOUR PH STUDY N/A 02/21/2018   Procedure: 24 HOUR PH STUDY;  Surgeon: Shila Gustav GAILS, MD;  Location: [facility] END[facility]COPY;  Service: Endoscopy;  Laterality: N/A;   BREAST BIOPSY Right 2009   BIOPSY, pt denies   BREAST BIOPSY Left 2003   Benign    CATARACT EXTRACTION Left    CESAREAN SECTION     COLONOSCOPY     ESOPHAGEAL MANOMETRY N/A 02/21/2018   Procedure: ESOPHAGEAL MANOMETRY (EM);  Surgeon: Shila Gustav GAILS, MD;  Location: [facility];  Service: Endoscopy;  Laterality: N/A;   FOOT FRACTURE SURGERY  2006 or 2007   right   HYMENECTOMY     LUNG BIOPSY  09/28/2012   Procedure: LUNG BIOPSY;  Surgeon: Elspeth JAYSON Millers, MD;  Location: [facility] OR;  Service: Thoracic;  Laterality: N/A;  lung biopsies tims three   RIGHT HEART CATH N/A 04/04/2024   Procedure: RIGHT HEART CATH;  Surgeon:  Rolan Ezra RAMAN, MD;  Location: [facility] INVASIVE CV LAB;  Service: Cardiovascular;  Laterality: N/A;   SQUAMOUS CELL CARCINOMA EXCISION Left    left arm   TOTAL HIP ARTHROPLASTY Right 09/10/2015   Procedure: RIGHT TOTAL HIP ARTHROPLASTY ANTERIOR APPROACH;  Surgeon: Donnice Car, MD;  Location: [facility] ORS;  Service: Orthopedics;  Laterality: Right;   TOTAL HIP ARTHROPLASTY Left 10/03/2019   Procedure: TOTAL HIP ARTHROPLASTY ANTERIOR APPROACH;  Surgeon: Car Donnice, MD;  Location: [facility] ORS;  Service: Orthopedics;  Laterality: Left;  70 mins   TUBAL LIGATION     UPPER GI ENDOSCOPY     VIDEO ASSISTED THORACOSCOPY  09/28/2012   Procedure: VIDEO ASSISTED THORACOSCOPY;  Surgeon: Elspeth JAYSON Millers, MD;  Location: [facility] OR;  Service: Thoracic;  Laterality: Right;   VIDEO BRONCHOSCOPY  11/19/2011   Procedure: VIDEO BRONCHOSCOPY WITH FLUORO;  Surgeon: Dorethia Cave,  MD;  Location: [facility] ENDOSCOPY;  Service: Endoscopy;;     Social History:   reports that she has never smoked. She has never used smokeless tobacco. She reports current alcohol use of about 2.0 standard drinks of alcohol per week. She reports that she does not use drugs.   Family History:  Her family history includes Allergic rhinitis in her daughter; Asthma in her maternal grandmother and mother; Bone cancer in her paternal grandfather; Breast cancer in her cousin, cousin, cousin, paternal aunt, and paternal aunt; Dementia (age of onset: 63) in her mother; Diabetes in her father; Emphysema in her maternal grandmother; Heart disease in her sister; Hypertension in her sister; Kidney disease in her sister; Lung disease in her maternal grandfather; Lymphoma in her father; Osteoarthritis in her mother; Ovarian cancer in her maternal aunt. There is no history of Colon cancer, Angioedema, Eczema, Immunodeficiency, or Urticaria.   Allergies Allergies  Allergen Reactions   Ozempic (0.25 Or 0.5 Mg-Dose) [Semaglutide(0.25 Or 0.5mg -Dos)] Other (See Comments)    Paralyzed the stomach   Tape Other (See Comments)    SKIN IS VERY FRAGILE!! Burns and pulls up the skin!! SKIN BRUISES and TEARS EASILY.SABRA   Betadine [Povidone Iodine] Other (See Comments)    Blisters    Codeine Nausea And Vomiting   Garlic Diarrhea   Hydrocodone  Nausea And Vomiting   Macrobid [Nitrofurantoin] Other (See Comments)    Instructed by dr not to take    Ofev  [Nintedanib] Other (See Comments)    Abdominal pain   Onion Diarrhea   Shellfish Allergy Nausea And Vomiting   Statins Other (See Comments)    Leg pain, but Pravachol  is tolerated   Sulfa  Antibiotics Other (See Comments)    Headaches      Home Medications  Prior to Admission medications   Medication Sig Start Date End Date Taking? Authorizing Provider  albuterol  (PROAIR  HFA) 108 (90 Base) MCG/ACT inhaler Inhale 2 puffs into the lungs every 4 (four) hours as needed  for wheezing. Or coughing spells.  You may use 2 Puffs 5-10 minutes before exercise. 03/17/24   Amin, Ankit C, MD  Alpha-D-Galactosidase (BEANO PO) Take 1 tablet by mouth as needed (as directed, if eating onion or garlic).    [provider]  AMBULATORY NON FORMULARY MEDICATION See admin instructions. Allergy injections on Mondays and hold when sick    [provider]  ascorbic acid (VITAMIN C) 500 MG tablet Take 500 mg by mouth daily.    [provider]  ASHWAGANDHA PO Take 1 capsule by mouth every other day.    [provider]  budesonide  (PULMICORT ) 0.5 MG/2ML nebulizer solution Take 2 mLs (0.5 mg total) by nebulization in the morning and at bedtime. 03/17/24   Amin, Ankit C, MD  Calcium  Carb-Cholecalciferol (CALCIUM  500 + D PO) Take 1 tablet by mouth daily.    [provider]  chlorpheniramine (CHLOR-TRIMETON) 4 MG tablet Take 2 mg by mouth 2 (two) times daily as needed for allergies or rhinitis.    [provider]  cholecalciferol (VITAMIN D3) 25 MCG (1000 UNIT) tablet Take 1,000 Units by mouth every other day.    [provider]  Continuous Glucose Sensor (DEXCOM G7 SENSOR) MISC Inject 1 Device into the skin See admin instructions. Place one new sensor into the skin every 10 days    [provider]  Continuous Glucose Sensor (DEXCOM G7 SENSOR) MISC Check blood sugar fasting and before meals and at bedtime. 04/23/24     dicyclomine  (BENTYL ) 10 MG capsule Take 10 mg by mouth daily as needed for spasms. 08/16/18   [provider]  diltiazem  (CARDIZEM  CD) 120 MG 24 hr capsule Take 1 capsule (120 mg total) by mouth daily. 03/24/24   Lavona Agent, MD  diphenhydrAMINE -PE-APAP (DELSYM COUGH/COLD NIGHT TIME) 12.5-5-325 MG/10ML LIQD Take 10 mLs by mouth every 12 (twelve) hours as needed (for coughing).    [provider]  EPINEPHrine  0.3 mg/0.3 mL IJ SOAJ injection Inject 0.3 mg into the muscle as needed for anaphylaxis.     [provider]  ezetimibe  (ZETIA ) 10 MG tablet Take 1 tablet (10 mg total) by mouth daily. 02/10/24   Lavona Agent, MD  fluticasone  (FLONASE ) 50 MCG/ACT nasal spray Spray 2 sprays into each nostril every day. 03/06/24   Geronimo Amel, MD  fluticasone  furoate-vilanterol (BREO ELLIPTA ) 100-25 MCG/ACT AEPB Inhale 1 puff into the lungs daily. 03/17/24   Amin, Burgess BROCKS, MD  HUMALOG  KWIKPEN 100 UNIT/ML KwikPen Inject 6 Units into the skin 3 (three) times daily before meals. 01/27/24   [provider]  Hypromellose (NATURES TEARS OP) Place 1 drop into both eyes in the morning and at bedtime. Patient not taking: Reported on 05/05/2024    [provider]  ipratropium-albuterol  (DUONEB) 0.5-2.5 (3) MG/3ML SOLN Take 3 mLs by nebulization every 6 (six) hours as needed (for shortness of breath or wheezing or when sick). 03/17/24   Caleen Burgess BROCKS, MD  LANTUS  SOLOSTAR 100 UNIT/ML Solostar Pen Inject 20 Units into the skin daily before breakfast. 01/03/24   [provider]  levocetirizine (XYZAL ) 5 MG tablet Take 5 mg by mouth at bedtime. 09/26/20   [provider]  metFORMIN  (GLUCOPHAGE -XR) 500 MG 24 hr tablet Take 500 mg by mouth daily with breakfast. 07/08/23   [provider]  metoprolol  succinate (TOPROL -XL) 25 MG 24 hr tablet Take 1 tablet (25 mg total) by mouth daily. 06/10/23   Lavona Agent, MD  montelukast  (SINGULAIR ) 10 MG tablet TAKE 1 TABLET BY MOUTH EVERYDAY AT BEDTIME 01/29/20   Geronimo Amel, MD  Multiple Vitamin (MULTIVITAMIN) tablet Take 1 tablet by mouth daily with breakfast.    [provider]  mycophenolate  (CELLCEPT ) 500 MG tablet Take 1 tab twice daily x 2 weeks. REPEAT LABS. If stable, increase to 2 tabs twice daily x 2 weeks. REPEAT LABS. If stable, increase to 3 tabs twice daily 02/07/24   Geronimo Amel, MD  nitroGLYCERIN  (NITROSTAT ) 0.4 MG SL tablet Place 1 tablet (0.4 mg total) under the tongue every 5 (five) minutes as  needed for chest pain. 12/06/18 05/05/24  Kroeger, Krista M., PA-C  omeprazole  (PRILOSEC  OTC) 20 MG tablet Take 10 mg by mouth daily before breakfast.    [provider]  OXYGEN  Inhale 4-5 L/min into the lungs See admin instructions. Inhale 4-5 L/min at bedtime and as needed for shortness of breath during the day    [provider]  pravastatin  (PRAVACHOL ) 40 MG tablet Take 1 tablet (40 mg total) by mouth every evening. 06/12/20   Lavona Agent, MD  predniSONE  (DELTASONE ) 10 MG tablet Take 6 tablets (60 mg total) by mouth daily with breakfast for 14 days, THEN 4 tablets (40 mg total) daily with breakfast for 14 days, THEN 3 tablets (30 mg total) daily with breakfast for 14 days, THEN 2 tablets (20 mg total) daily with breakfast for 14 days, THEN 1 tablet (10 mg total) daily with breakfast. 03/18/24 06/12/24  Amin, Ankit C, MD  sodium chloride  (OCEAN) 0.65 % SOLN nasal spray Place 1 spray into both nostrils as needed for congestion.    [provider]  Sodium Chloride , Inhalant, 7 % NEBU Use per nebulizer once a day to help clear sputum 07/08/21   Geronimo Amel, MD     Critical care time:   Emanuele Mcwhirter D. Harris, NP-C [facility] Personal contact information can be found on Amion  If no contact or response made please call [phone] 05/22/2024, 3:02 PM

## 2024-05-22 NOTE — Progress Notes (Signed)
 PHARMACY - ANTICOAGULATION CONSULT NOTE  Pharmacy Consult for heparin  Indication: pulmonary embolus  Allergies  Allergen Reactions   Ozempic (0.25 Or 0.5 Mg-Dose) [Semaglutide(0.25 Or 0.5mg -Dos)] Other (See Comments)    Paralyzed the stomach   Tape Other (See Comments)    SKIN IS VERY FRAGILE!! Burns and pulls up the skin!! SKIN BRUISES and TEARS EASILY.SABRA   Betadine [Povidone Iodine] Other (See Comments)    Blisters    Codeine Nausea And Vomiting    Other Reaction(s): Unknown   Garlic Diarrhea   Hydrocodone  Nausea And Vomiting   Iodine    Macrobid [Nitrofurantoin] Other (See Comments)    Instructed by dr not to take    Ofev  [Nintedanib] Other (See Comments)    Abdominal pain   Onion Diarrhea   Shellfish Allergy Nausea And Vomiting   Statins Other (See Comments)    Leg pain, but Pravachol  is tolerated   Sulfa  Antibiotics Other (See Comments)    Headaches     Patient Measurements: Weight: 66.2 kg (146 lb) Heparin  dosing weight 58 kg  Vital Signs: Temp: 98.3 F (36.8 C) (06/30 1858) Temp Source: Oral (06/30 1640) BP: 150/65 (06/30 1858) Pulse Rate: 68 (06/30 1858)  Labs: Recent Labs    05/22/24 1301 05/22/24 1522  HGB 13.7  --   HCT 44.8  --   PLT 238  --   CREATININE 0.89  --   TROPONINIHS 5 4    Estimated Creatinine Clearance: 45.2 mL/min (by C-G formula based on SCr of 0.89 mg/dL).   Medical History: Past Medical History:  Diagnosis Date   Allergy    Arthritis    history spinal stenosis. osteoarthritis right hip   Asthma    Cataract    Coronary artery calcification seen on CAT scan 08/19/2017   >300 on CT scan 08/2017   DDD (degenerative disc disease), thoracic    Depression    DOE (dyspnea on exertion)    a. 04/2010 Lexi MV EF 71%, no ischemia/infarct;     Fatty liver    GERD (gastroesophageal reflux disease)    H/O steroid therapy    Steroid use orally over 4 yrs- for Lung Fibrosis   Heart palpitations 02/28/2015   Helicobacter  pylori ab+    Hemorrhoids    Hiatal hernia    High cholesterol    History of chronic bronchitis    as child   History of migraine    History of MRSA infection    Hyperlipidemia, mixed 08/19/2017   Hyperplastic colon polyp 2007   IBS (irritable bowel syndrome)    Inguinal hernia    right   Insulin  resistance    past   Interstitial lung disease (HCC)    MVP (mitral valve prolapse)    Posterior mitral valve leaflet with mild MR   Pneumonia    Pneumonitis, hypersensitivity (HCC)    a. 09/2012 s/p Bx - ? 2/2 bird, mold, oil paint exposure ->on steroids, followed by pulm.   PONV (postoperative nausea and vomiting)    Pre-diabetes    takes Metformin    Pulmonary fibrosis (HCC)    Dr. Geronimo follows- stable at present   PVC (premature ventricular contraction) 08/19/2017   Rapid heart rate    Dr. Shlomo follows- last visit Epic note 9'16   Squamous cell carcinoma of skin    Tinnitus, right ear    Vocal cord ulcer     Medications: No anticoagulants PTA  Assessment: Pt is a 35 yoF presenting with SOB/respiratory distress.  PMH significant for chronic hypersensitivity pneumonitis related to ILD. CTA revealed small bilateral PE - minimal clot burden with no evidence of RHS. Pharmacy consulted to dose heparin .   Today, 05/22/24 CBC WNL TBW 66 kg, enoxaparin  50 mg subQ dose given @ 17:18 this evening  Goal of Therapy:  Heparin  level 0.3-0.7 units/ml Monitor platelets by anticoagulation protocol: Yes   Plan:  No bolus since enoxaparin  50 mg dose given Start heparin  infusion at 950 units/hr ~10 hours after enoxaparin  Check 8 hour heparin  level CBC, heparin  level daily Monitor for signs of bleeding  Ronal CHRISTELLA Rav, PharmD 05/22/2024,7:28 PM

## 2024-05-22 NOTE — ED Provider Notes (Signed)
 I received the patient in signout with CT angiogram pending.  Patient has interstitial lung disease and was evaluated by pulmonology.  She is requiring more oxygen  than normal.  They did recommend starting the patient on CAP coverage which is already started.  I did discuss her case with radiology.  Report that she does have a small segmental pulmonary embolism.  The patient is given Lovenox .  A call was placed to the hospitalist service for admission. Physical Exam  BP (!) 141/71   Pulse 73   Temp 98.4 F (36.9 C) (Oral)   Resp 20   Wt 66.2 kg   SpO2 98%   BMI 29.49 kg/m   Physical Exam General: No acute distress Procedures  Procedures  ED Course / MDM    Medical Decision Making Amount and/or Complexity of Data Reviewed Labs: ordered. Radiology: ordered.  Risk Prescription drug management.          Ula Prentice SAUNDERS, MD 05/22/24 [phone]

## 2024-05-22 NOTE — ED Notes (Signed)
 ED TO INPATIENT HANDOFF REPORT  Name/Age/Gender Rhonda [name] 73 y.o. female  Code Status    Code Status Orders  (From admission, onward)           Start     Ordered   05/22/24 1757  Full code  Continuous       Question:  By:  Answer:  Consent: discussion documented in EHR   05/22/24 1756           Code Status History     Date Active Date Inactive Code Status Order ID Comments User Context   04/04/2024 0959 04/04/2024 1603 Full Code [id number]  Rolan Ezra RAMAN, MD Inpatient   03/14/2024 1534 03/17/2024 1557 Full Code [id number]  Zella Katha HERO, MD ED   10/03/2019 1929 10/04/2019 1924 Full Code [id number]  Danella Donnice RIGGERS Inpatient   09/10/2015 1043 09/12/2015 1710 Full Code [id number]  Danella Donnice RIGGERS Inpatient   09/28/2012 1219 09/30/2012 1954 Full Code [id number]  Moishe Chiquita Jansky, RN Inpatient       Home/SNF/Other Home  Chief Complaint Acute hypoxic respiratory failure (HCC) [J96.01]  Level of Care/Admitting Diagnosis ED Disposition     ED Disposition  Admit   Condition  --   Comment  Hospital Area: [facility] [[id number]]  Level of Care: Telemetry [5]  Admit to tele based on following criteria: Eval of Syncope  May admit patient to [facility] or [facility] if equivalent level of care is available:: No  Covid Evaluation: Asymptomatic - no recent exposure (last 10 days) testing not required  Diagnosis: Acute hypoxic respiratory failure [facility]) [[id number]]  Admitting Physician: DENA CHARLESTON [[id number]]  Attending Physician: DENA CHARLESTON [[id number]]  Certification:: I certify this patient will need inpatient services for at least 2 midnights  Expected Medical Readiness: 05/24/2024          Medical History Past Medical History:  Diagnosis Date   Allergy    Arthritis    history spinal stenosis. osteoarthritis right hip   Asthma    Cataract    Coronary artery calcification seen on CAT scan 08/19/2017   >300 on CT scan  08/2017   DDD (degenerative disc disease), thoracic    Depression    DOE (dyspnea on exertion)    a. 04/2010 Lexi MV EF 71%, no ischemia/infarct;     Fatty liver    GERD (gastroesophageal reflux disease)    H/O steroid therapy    Steroid use orally over 4 yrs- for Lung Fibrosis   Heart palpitations 02/28/2015   Helicobacter pylori ab+    Hemorrhoids    Hiatal hernia    High cholesterol    History of chronic bronchitis    as child   History of migraine    History of MRSA infection    Hyperlipidemia, mixed 08/19/2017   Hyperplastic colon polyp 2007   IBS (irritable bowel syndrome)    Inguinal hernia    right   Insulin  resistance    past   Interstitial lung disease (HCC)    MVP (mitral valve prolapse)    Posterior mitral valve leaflet with mild MR   Pneumonia    Pneumonitis, hypersensitivity (HCC)    a. 09/2012 s/p Bx - ? 2/2 bird, mold, oil paint exposure ->on steroids, followed by pulm.   PONV (postoperative nausea and vomiting)    Pre-diabetes    takes Metformin    Pulmonary fibrosis (HCC)    Dr. Geronimo follows- stable at present   PVC (premature ventricular contraction)  08/19/2017   Rapid heart rate    Dr. Shlomo follows- last visit Epic note 9'16   Squamous cell carcinoma of skin    Tinnitus, right ear    Vocal cord ulcer     Allergies Allergies  Allergen Reactions   Ozempic (0.25 Or 0.5 Mg-Dose) [Semaglutide(0.25 Or 0.5mg -Dos)] Other (See Comments)    Paralyzed the stomach   Tape Other (See Comments)    SKIN IS VERY FRAGILE!! Burns and pulls up the skin!! SKIN BRUISES and TEARS EASILY.SABRA   Betadine [Povidone Iodine] Other (See Comments)    Blisters    Codeine Nausea And Vomiting   Garlic Diarrhea   Hydrocodone  Nausea And Vomiting   Macrobid [Nitrofurantoin] Other (See Comments)    Instructed by dr not to take    Ofev  [Nintedanib] Other (See Comments)    Abdominal pain   Onion Diarrhea   Shellfish Allergy Nausea And Vomiting   Statins Other (See  Comments)    Leg pain, but Pravachol  is tolerated   Sulfa  Antibiotics Other (See Comments)    Headaches     IV Location/Drains/Wounds Patient Lines/Drains/Airways Status     Active Line/Drains/Airways     Name Placement date Placement time Site Days   Peripheral IV 05/22/24 Left Antecubital 05/22/24  1515  Antecubital  less than 1            Labs/Imaging Results for orders placed or performed during the hospital encounter of 05/22/24 (from the past 48 hours)  Basic metabolic panel     Status: Abnormal   Collection Time: 05/22/24  1:01 PM  Result Value Ref Range   Sodium 137 135 - 145 mmol/L   Potassium 4.2 3.5 - 5.1 mmol/L   Chloride 106 98 - 111 mmol/L   CO2 21 (L) 22 - 32 mmol/L   Glucose, Bld 234 (H) 70 - 99 mg/dL    Comment: Glucose reference range applies only to samples taken after fasting for at least 8 hours.   BUN 36 (H) 8 - 23 mg/dL   Creatinine, Ser 9.10 0.44 - 1.00 mg/dL   Calcium  9.1 8.9 - 10.3 mg/dL   GFR, Estimated >39 >39 mL/min    Comment: (NOTE) Calculated using the CKD-EPI Creatinine Equation (2021)    Anion gap 10 5 - 15    Comment: Performed at [facility], [address], [place], [place] [address]  CBC     Status: Abnormal   Collection Time: 05/22/24  1:01 PM  Result Value Ref Range   WBC 15.5 (H) 4.0 - 10.5 K/uL   RBC 4.95 3.87 - 5.11 MIL/uL   Hemoglobin 13.7 12.0 - 15.0 g/dL   HCT 55.1 63.9 - 53.9 %   MCV 90.5 80.0 - 100.0 fL   MCH 27.7 26.0 - 34.0 pg   MCHC 30.6 30.0 - 36.0 g/dL   RDW 81.2 (H) 88.4 - 84.4 %   Platelets 238 150 - 400 K/uL   nRBC 0.2 0.0 - 0.2 %    Comment: Performed at [facility], [address] [place], [place] [address]  Troponin I (High Sensitivity)     Status: None   Collection Time: 05/22/24  1:01 PM  Result Value Ref Range   Troponin I (High Sensitivity) 5 <18 ng/L    Comment: (NOTE) Elevated high sensitivity troponin I (hsTnI) values and significant  changes across  serial measurements may suggest ACS but many other  chronic and acute conditions are known to elevate hsTnI  results.  Refer to the Links section for chest pain algorithms and additional  guidance. Performed at [facility], [address] [place], [place] [address]   Brain natriuretic peptide     Status: None   Collection Time: 05/22/24  1:02 PM  Result Value Ref Range   B Natriuretic Peptide 89.7 0.0 - 100.0 pg/mL    Comment: Performed at [facility], [address] [place], [place] [address]  D-dimer, quantitative     Status: Abnormal   Collection Time: 05/22/24  1:02 PM  Result Value Ref Range   D-Dimer, Quant 0.58 (H) 0.00 - 0.50 ug/mL-FEU    Comment: (NOTE) At the manufacturer cut-off value of 0.5 g/mL FEU, this assay has a negative predictive value of 95-100%.This assay is intended for use in conjunction with a clinical pretest probability (PTP) assessment model to exclude pulmonary embolism (PE) and deep venous thrombosis (DVT) in outpatients suspected of PE or DVT. Results should be correlated with clinical presentation. Performed at [facility], [address], [place], [place] [address]   Troponin I (High Sensitivity)     Status: None   Collection Time: 05/22/24  3:22 PM  Result Value Ref Range   Troponin I (High Sensitivity) 4 <18 ng/L    Comment: (NOTE) Elevated high sensitivity troponin I (hsTnI) values and significant  changes across serial measurements may suggest ACS but many other  chronic and acute conditions are known to elevate hsTnI results.  Refer to the Links section for chest pain algorithms and additional  guidance. Performed at [facility], [address] [place], [place] [address]   I-Stat Lactic Acid     Status: Abnormal   Collection Time: 05/22/24  3:53 PM  Result Value Ref Range   Lactic Acid, Venous 2.1 (HH) 0.5 - 1.9 mmol/L   Comment NOTIFIED PHYSICIAN    I-Stat Lactic Acid     Status: Abnormal   Collection Time: 05/22/24  4:41 PM  Result Value Ref Range   Lactic Acid, Venous 2.0 (HH) 0.5 - 1.9 mmol/L   Comment NOTIFIED PHYSICIAN    *Note: Due to a large number of results and/or encounters for the requested time period, some results have not been displayed. A complete set of results can be found in Results Review.   CT Angio Chest PE W/Cm &/Or Wo Cm Result Date: 05/22/2024 CLINICAL DATA:  Positive D-dimer, hypoxia, labored breathing, history of interstitial lung disease EXAM: CT ANGIOGRAPHY CHEST WITH CONTRAST TECHNIQUE: Multidetector CT imaging of the chest was performed using the standard protocol during bolus administration of intravenous contrast. Multiplanar CT image reconstructions and MIPs were obtained to evaluate the vascular anatomy. RADIATION DOSE REDUCTION: This exam was performed according to the departmental dose-optimization program which includes automated exposure control, adjustment of the mA and/or kV according to patient size and/or use of iterative reconstruction technique. CONTRAST:  75mL OMNIPAQUE  IOHEXOL  350 MG/ML SOLN COMPARISON:  03/14/2024 FINDINGS: Cardiovascular: This is a technically adequate evaluation of the pulmonary vasculature. There is a subsegmental right lower lobe pulmonary embolus, reference image 78/4. Nonocclusive filling defect within the left upper lobe pulmonary artery reference image 54/4 consistent with age indeterminate pulmonary embolus. Clot burden is minimal. No right heart strain. The heart is unremarkable without pericardial effusion. Normal caliber of the thoracic aorta. Atherosclerosis of the aorta and coronary vasculature. Mediastinum/Nodes: Stable borderline enlarged mediastinal lymph nodes, likely reactive. No pathologic adenopathy. Thyroid , trachea, and esophagus are stable. Lungs/Pleura: Stable areas  of bilobed bilateral pulmonary fibrosis and scarring, with minimal traction bronchiectasis  unchanged. Stable areas of areas of ground-glass opacity most pronounced in the upper lobes consistent with pneumonitis. No dense consolidation, effusion, or pneumothorax. Central airways are patent. Upper Abdomen: No acute abnormality. Musculoskeletal: No acute or destructive bony abnormalities. Reconstructed images demonstrate no additional findings. Review of the MIP images confirms the above findings. IMPRESSION: 1. Small bilateral pulmonary emboli within the right lower and left upper lobes. Minimal clot burden, with no evidence of right heart strain. 2. Stable findings of chronic pulmonary fibrosis, without appreciable change in the scattered ground-glass opacity seen previously consistent with pneumonitis. 3. Aortic Atherosclerosis (ICD10-I70.0). Coronary artery atherosclerosis. Critical Value/emergent results were called by telephone at the time of interpretation on 05/22/2024 at 4:47 pm to provider ANDREW TEE, who verbally acknowledged these results. Electronically Signed   By: Ozell Daring M.D.   On: 05/22/2024 16:51   DG Chest 2 View Result Date: 05/22/2024 CLINICAL DATA:  Shortness of breath.  Hypoxia. EXAM: CHEST - 2 VIEW COMPARISON:  03/14/2024 FINDINGS: Normal-sized heart. Stable extensive chronic interstitial prominence. Stable right middle lobe linear scarring. Unremarkable bones. IMPRESSION: 1. No acute abnormality. 2. Stable extensive chronic interstitial lung disease. Electronically Signed   By: Elspeth Bathe M.D.   On: 05/22/2024 13:21    Pending Labs Unresulted Labs (From admission, onward)     Start     Ordered   05/29/24 0500  Creatinine, serum  (enoxaparin  (LOVENOX )    CrCl >/= 30 ml/min)  Weekly,   R     Comments: while on enoxaparin  therapy    05/22/24 1756   05/22/24 1756  CBC  (enoxaparin  (LOVENOX )    CrCl >/= 30 ml/min)  Once,   R       Comments: Baseline for enoxaparin  therapy IF NOT ALREADY DRAWN.  Notify MD if PLT < 100 K.    05/22/24 1756   05/22/24 1756   Creatinine, serum  (enoxaparin  (LOVENOX )    CrCl >/= 30 ml/min)  Once,   R       Comments: Baseline for enoxaparin  therapy IF NOT ALREADY DRAWN.    05/22/24 1756   05/22/24 1553  Respiratory (~20 pathogens) panel by PCR  (Respiratory panel by PCR (~20 pathogens, ~24 hr TAT)  w precautions)  Once,   URGENT        05/22/24 1557   05/22/24 1225  Culture, blood (routine x 2)  BLOOD CULTURE X 2,   R (with STAT occurrences)      05/22/24 1224            Vitals/Pain Today's Vitals   05/22/24 1218 05/22/24 1220 05/22/24 1515 05/22/24 1640  BP: (!) 144/85  (!) 141/71   Pulse: 78  73   Resp: 18  20   Temp: 98.1 F (36.7 C)   98.4 F (36.9 C)  TempSrc: Oral   Oral  SpO2: 96%  98%   Weight:  66.2 kg    PainSc: 0-No pain       Isolation Precautions Droplet precaution  Medications Medications  methylPREDNISolone  sodium succinate (SOLU-MEDROL ) 125 mg/2 mL injection 125 mg (125 mg Intravenous Given 05/22/24 1644)  mycophenolate  (CELLCEPT ) capsule 500 mg (has no administration in time range)  cefTRIAXone (ROCEPHIN) 2 g in sodium chloride  0.9 % 100 mL IVPB (0 g Intravenous Stopped 05/22/24 1721)  azithromycin  (ZITHROMAX ) 500 mg in sodium chloride  0.9 % 250 mL IVPB (500 mg Intravenous New Bag/Given 05/22/24 1720)  revefenacin (  YUPELRI) nebulizer solution 175 mcg (has no administration in time range)  arformoterol (BROVANA) nebulizer solution 15 mcg (has no administration in time range)  budesonide  (PULMICORT ) nebulizer solution 0.25 mg (has no administration in time range)  enoxaparin  (LOVENOX ) injection 40 mg (has no administration in time range)  acetaminophen  (TYLENOL ) tablet 650 mg (has no administration in time range)    Or  acetaminophen  (TYLENOL ) suppository 650 mg (has no administration in time range)  ondansetron  (ZOFRAN ) tablet 4 mg (has no administration in time range)    Or  ondansetron  (ZOFRAN ) injection 4 mg (has no administration in time range)  albuterol  (PROVENTIL ) (2.5  MG/3ML) 0.083% nebulizer solution 2.5 mg (has no administration in time range)  iohexol  (OMNIPAQUE ) 350 MG/ML injection 75 mL (75 mLs Intravenous Contrast Given 05/22/24 1622)  enoxaparin  (LOVENOX ) injection 50 mg (50 mg Subcutaneous Given 05/22/24 1718)    Mobility walks with person assist

## 2024-05-22 NOTE — ED Notes (Signed)
 RN and Provider notified about both IStat Lactic results

## 2024-05-22 NOTE — ED Provider Notes (Signed)
 [place] [facility] AT [facility] Provider Note   CSN: [id number] Arrival date & time: 05/22/24  1211     Patient presents with: Respiratory Distress   Rhonda [name] is a 73 y.o. female.   Patient is a 73 year old female with a history of hypersensitivity pneumonitis related interstitial lung disease, GERD, steroid-induced diabetes, hyperlipidemia who presents with shortness of breath.  She has had some decline in her respiratory status since admission in April related to rhinovirus.  She also was started on CellCept  in attempt to diminish her steroids.  She did have her steroids decreased recently down to 20 mg.  She started to have a decline.  She was placed back on prednisone  60 mg daily and actually went to the beach last week.  She said overall she was doing well but over the last couple days has had a worsening shortness of breath.  She has hypoxia down to about 80% with minimal exertion.  This was documented at her endocrinologist office this morning.  She has a cough which seems to be productive of some green sputum.  She has had this in the past but it had cleared up and now seems to be back again.  She denies any fevers.  No associated chest pain.  No leg pain or swelling.  She drove about 3 and half hours each way to the beach.  No history of PE.  She is on prophylactic Bactrim  3 times a week.       Prior to Admission medications   Medication Sig Start Date End Date Taking? Authorizing Provider  albuterol  (PROAIR  HFA) 108 (90 Base) MCG/ACT inhaler Inhale 2 puffs into the lungs every 4 (four) hours as needed for wheezing. Or coughing spells.  You may use 2 Puffs 5-10 minutes before exercise. 03/17/24   Amin, Ankit C, MD  Alpha-D-Galactosidase (BEANO PO) Take 1 tablet by mouth as needed (as directed, if eating onion or garlic).    [provider]  AMBULATORY NON FORMULARY MEDICATION See admin instructions. Allergy injections on Mondays and hold when  sick    [provider]  ascorbic acid (VITAMIN C) 500 MG tablet Take 500 mg by mouth daily.    [provider]  ASHWAGANDHA PO Take 1 capsule by mouth every other day.    [provider]  budesonide  (PULMICORT ) 0.5 MG/2ML nebulizer solution Take 2 mLs (0.5 mg total) by nebulization in the morning and at bedtime. 03/17/24   Amin, Burgess BROCKS, MD  Calcium  Carb-Cholecalciferol (CALCIUM  500 + D PO) Take 1 tablet by mouth daily.    [provider]  chlorpheniramine (CHLOR-TRIMETON) 4 MG tablet Take 2 mg by mouth 2 (two) times daily as needed for allergies or rhinitis.    [provider]  cholecalciferol (VITAMIN D3) 25 MCG (1000 UNIT) tablet Take 1,000 Units by mouth every other day.    [provider]  Continuous Glucose Sensor (DEXCOM G7 SENSOR) MISC Inject 1 Device into the skin See admin instructions. Place one new sensor into the skin every 10 days    [provider]  Continuous Glucose Sensor (DEXCOM G7 SENSOR) MISC Check blood sugar fasting and before meals and at bedtime. 04/23/24     dicyclomine  (BENTYL ) 10 MG capsule Take 10 mg by mouth daily as needed for spasms. 08/16/18   [provider]  diltiazem  (CARDIZEM  CD) 120 MG 24 hr capsule Take 1 capsule (120 mg total) by mouth daily. 03/24/24   Lavona Agent,  MD  diphenhydrAMINE -PE-APAP (DELSYM COUGH/COLD NIGHT TIME) 12.5-5-325 MG/10ML LIQD Take 10 mLs by mouth every 12 (twelve) hours as needed (for coughing).    [provider]  EPINEPHrine  0.3 mg/0.3 mL IJ SOAJ injection Inject 0.3 mg into the muscle as needed for anaphylaxis.    [provider]  ezetimibe  (ZETIA ) 10 MG tablet Take 1 tablet (10 mg total) by mouth daily. 02/10/24   Lavona Agent, MD  fluticasone  (FLONASE ) 50 MCG/ACT nasal spray Spray 2 sprays into each nostril every day. 03/06/24   Geronimo Amel, MD  fluticasone  furoate-vilanterol (BREO ELLIPTA ) 100-25 MCG/ACT AEPB Inhale 1 puff into the lungs  daily. 03/17/24   Amin, Burgess BROCKS, MD  HUMALOG  KWIKPEN 100 UNIT/ML KwikPen Inject 6 Units into the skin 3 (three) times daily before meals. 01/27/24   [provider]  Hypromellose (NATURES TEARS OP) Place 1 drop into both eyes in the morning and at bedtime. Patient not taking: Reported on 05/05/2024    [provider]  ipratropium-albuterol  (DUONEB) 0.5-2.5 (3) MG/3ML SOLN Take 3 mLs by nebulization every 6 (six) hours as needed (for shortness of breath or wheezing or when sick). 03/17/24   Caleen Burgess BROCKS, MD  LANTUS  SOLOSTAR 100 UNIT/ML Solostar Pen Inject 20 Units into the skin daily before breakfast. 01/03/24   [provider]  levocetirizine (XYZAL ) 5 MG tablet Take 5 mg by mouth at bedtime. 09/26/20   [provider]  metFORMIN  (GLUCOPHAGE -XR) 500 MG 24 hr tablet Take 500 mg by mouth daily with breakfast. 07/08/23   [provider]  metoprolol  succinate (TOPROL -XL) 25 MG 24 hr tablet Take 1 tablet (25 mg total) by mouth daily. 06/10/23   Lavona Agent, MD  montelukast  (SINGULAIR ) 10 MG tablet TAKE 1 TABLET BY MOUTH EVERYDAY AT BEDTIME 01/29/20   Geronimo Amel, MD  Multiple Vitamin (MULTIVITAMIN) tablet Take 1 tablet by mouth daily with breakfast.    [provider]  mycophenolate  (CELLCEPT ) 500 MG tablet Take 1 tab twice daily x 2 weeks. REPEAT LABS. If stable, increase to 2 tabs twice daily x 2 weeks. REPEAT LABS. If stable, increase to 3 tabs twice daily 02/07/24   Geronimo Amel, MD  nitroGLYCERIN  (NITROSTAT ) 0.4 MG SL tablet Place 1 tablet (0.4 mg total) under the tongue every 5 (five) minutes as needed for chest pain. 12/06/18 05/05/24  Kroeger, Krista M., PA-C  omeprazole  (PRILOSEC  OTC) 20 MG tablet Take 10 mg by mouth daily before breakfast.    [provider]  OXYGEN  Inhale 4-5 L/min into the lungs See admin instructions. Inhale 4-5 L/min at bedtime and as needed for shortness of breath during the day    [provider]   pravastatin  (PRAVACHOL ) 40 MG tablet Take 1 tablet (40 mg total) by mouth every evening. 06/12/20   Lavona Agent, MD  predniSONE  (DELTASONE ) 10 MG tablet Take 6 tablets (60 mg total) by mouth daily with breakfast for 14 days, THEN 4 tablets (40 mg total) daily with breakfast for 14 days, THEN 3 tablets (30 mg total) daily with breakfast for 14 days, THEN 2 tablets (20 mg total) daily with breakfast for 14 days, THEN 1 tablet (10 mg total) daily with breakfast. 03/18/24 06/12/24  Amin, Ankit C, MD  sodium chloride  (OCEAN) 0.65 % SOLN nasal spray Place 1 spray into both nostrils as needed for congestion.    [provider]  Sodium Chloride , Inhalant, 7 % NEBU Use per nebulizer once a day to help clear sputum 07/08/21   Ramaswamy,  Dorethia, MD    Allergies: Ozempic (0.25 or 0.5 mg-dose) [semaglutide(0.25 or 0.5mg -dos)], Tape, Betadine [povidone iodine], Codeine, Garlic, Hydrocodone , Macrobid [nitrofurantoin], Ofev  [nintedanib], Onion, Shellfish allergy, Statins, and Sulfa  antibiotics    Review of Systems  Constitutional:  Positive for fatigue. Negative for chills, diaphoresis and fever.  HENT:  Negative for congestion, rhinorrhea and sneezing.   Eyes: Negative.   Respiratory:  Positive for cough and shortness of breath. Negative for chest tightness.   Cardiovascular:  Negative for chest pain and leg swelling.  Gastrointestinal:  Negative for abdominal pain, blood in stool, diarrhea, nausea and vomiting.  Genitourinary:  Negative for flank pain.  Musculoskeletal:  Negative for arthralgias and back pain.  Skin:  Negative for rash.  Neurological:  Negative for dizziness, numbness and headaches.    Updated Vital Signs BP (!) 144/85 (BP Location: Right Arm)   Pulse 78   Temp 98.1 F (36.7 C) (Oral)   Resp 18   Wt 66.2 kg   SpO2 96%   BMI 29.49 kg/m   Physical Exam Constitutional:      Appearance: She is well-developed.  HENT:     Head: Normocephalic and atraumatic.   Eyes:      Pupils: Pupils are equal, round, and reactive to light.    Cardiovascular:     Rate and Rhythm: Normal rate and regular rhythm.     Heart sounds: Normal heart sounds.  Pulmonary:     Effort: Pulmonary effort is normal. No respiratory distress.     Breath sounds: Normal breath sounds. No wheezing.     Comments: Tachypnea with some increased work of breathing, crackles on exam Chest:     Chest wall: No tenderness.  Abdominal:     General: Bowel sounds are normal.     Palpations: Abdomen is soft.     Tenderness: There is no abdominal tenderness. There is no guarding or rebound.   Musculoskeletal:        General: Normal range of motion.     Cervical back: Normal range of motion and neck supple.     Comments: No edema or calf tenderness  Lymphadenopathy:     Cervical: No cervical adenopathy.   Skin:    General: Skin is warm and dry.     Findings: No rash.   Neurological:     Mental Status: She is alert and oriented to person, place, and time.     (all labs ordered are listed, but only abnormal results are displayed) Labs Reviewed  BASIC METABOLIC PANEL WITH GFR - Abnormal; Notable for the following components:      Result Value   CO2 21 (*)    Glucose, Bld 234 (*)    BUN 36 (*)    All other components within normal limits  CBC - Abnormal; Notable for the following components:   WBC 15.5 (*)    RDW 18.7 (*)    All other components within normal limits  D-DIMER, QUANTITATIVE - Abnormal; Notable for the following components:   D-Dimer, Quant 0.58 (*)    All other components within normal limits  CULTURE, BLOOD (ROUTINE X 2)  CULTURE, BLOOD (ROUTINE X 2)  BRAIN NATRIURETIC PEPTIDE  I-STAT CG4 LACTIC ACID, ED  I-STAT CG4 LACTIC ACID, ED  TROPONIN I (HIGH SENSITIVITY)  TROPONIN I (HIGH SENSITIVITY)    EKG: EKG Interpretation Date/Time:  Monday May 22 2024 12:21:15 EDT Ventricular Rate:  80 PR Interval:  125 QRS Duration:  62 QT Interval:  482 QTC  Calculation: 557 R Axis:   11  Text Interpretation: Sinus rhythm Borderline repolarization abnormality Prolonged QT interval since last tracing no significant change Confirmed by Lenor Hollering [id number]) on 05/22/2024 3:14:21 PM  Radiology: [facility] Chest 2 View Result Date: 05/22/2024 CLINICAL DATA:  Shortness of breath.  Hypoxia. EXAM: CHEST - 2 VIEW COMPARISON:  03/14/2024 FINDINGS: Normal-sized heart. Stable extensive chronic interstitial prominence. Stable right middle lobe linear scarring. Unremarkable bones. IMPRESSION: 1. No acute abnormality. 2. Stable extensive chronic interstitial lung disease. Electronically Signed   By: Elspeth Bathe M.D.   On: 05/22/2024 13:21     Procedures   Medications Ordered in the ED - No data to display                                  Medical Decision Making Amount and/or Complexity of Data Reviewed Labs: ordered. Radiology: ordered.   This patient presents to the ED for concern of shortness of breath, this involves an extensive number of treatment options, and is a complaint that carries with it a high risk of complications and morbidity.  I considered the following differential and admission for this acute, potentially life threatening condition.  The differential diagnosis includes worsening interstitial lung disease, pneumonia, bronchitis, pulmonary edema, PE  MDM:    Patient is a 73 year old who presents with worsening shortness of breath and hypoxia.  She is recently been on increased dose of her prednisone  at 60 mg a day.  She has had some increase in green sputum.  No fevers.  She has an increased oxygen  requirement.  She discussed this with her pulmonologist who recommended admission to the hospitalist service with pulmonary consult.  Her BNP is normal.  Chest x-ray does not show any obvious pneumonia.  Given her recent beach trip, a D-dimer was performed which was elevated.  Awaiting CT scan of her chest to rule out PE.  Will plan admission following  this.  Care turned over to Dr. Ula pending CT scan.  Pulmonary is already consulting on the patient and will defer to them for steroid dosing and antibiotic management.  She is currently on nasal cannula oxygen  at 4 L/min.  (Labs, imaging, consults)  Labs: I Ordered, and personally interpreted labs.  The pertinent results include: Elevated WBC count at baseline, elevated D-dimer, normal BNP  Imaging Studies ordered: I ordered imaging studies including chest x-ray I independently visualized and interpreted imaging. I agree with the radiologist interpretation  Additional history obtained from chart review.  External records from outside source obtained and reviewed including prior notes  Cardiac Monitoring: The patient was maintained on a cardiac monitor.  If on the cardiac monitor, I personally viewed and interpreted the cardiac monitored which showed an underlying rhythm of: Sinus rhythm  Reevaluation: After the interventions noted above, I reevaluated the patient and found that they have :improved  Social Determinants of Health:    Disposition: Admit to hospital  Co morbidities that complicate the patient evaluation  Past Medical History:  Diagnosis Date   Allergy    Arthritis    history spinal stenosis. osteoarthritis right hip   Asthma    Cataract    Coronary artery calcification seen on CAT scan 08/19/2017   >300 on CT scan 08/2017   DDD (degenerative disc disease), thoracic    Depression    DOE (dyspnea on exertion)    a. 04/2010 Lexi MV EF 71%, no ischemia/infarct;  Fatty liver    GERD (gastroesophageal reflux disease)    H/O steroid therapy    Steroid use orally over 4 yrs- for Lung Fibrosis   Heart palpitations 02/28/2015   Helicobacter pylori ab+    Hemorrhoids    Hiatal hernia    High cholesterol    History of chronic bronchitis    as child   History of migraine    History of MRSA infection    Hyperlipidemia, mixed 08/19/2017   Hyperplastic colon  polyp 2007   IBS (irritable bowel syndrome)    Inguinal hernia    right   Insulin  resistance    past   Interstitial lung disease (HCC)    MVP (mitral valve prolapse)    Posterior mitral valve leaflet with mild MR   Pneumonia    Pneumonitis, hypersensitivity (HCC)    a. 09/2012 s/p Bx - ? 2/2 bird, mold, oil paint exposure ->on steroids, followed by pulm.   PONV (postoperative nausea and vomiting)    Pre-diabetes    takes Metformin    Pulmonary fibrosis (HCC)    Dr. Geronimo follows- stable at present   PVC (premature ventricular contraction) 08/19/2017   Rapid heart rate    Dr. Shlomo follows- last visit Epic note 9'16   Squamous cell carcinoma of skin    Tinnitus, right ear    Vocal cord ulcer      Medicines No orders of the defined types were placed in this encounter.   I have reviewed the patients home medicines and have made adjustments as needed  Problem List / ED Course: Problem List Items Addressed This Visit   None Visit Diagnoses       Hypoxia    -  Primary     Shortness of breath                    Final diagnoses:  Hypoxia  Shortness of breath    ED Discharge Orders     None          Lenor Hollering, MD 05/22/24 1517

## 2024-05-22 NOTE — H&P (Signed)
 History and Physical    Rhonda [name] FMW:[id number] DOB: Dec 21, 1950 DOA: 05/22/2024  PCP: Geronimo Amel, MD   Chief Complaint: Shortness of breath  HPI: Rhonda [name] is a 73 y.o. female with medical history significant of interstitial lung disease, CAD, GERD, hyperlipidemia, hypertension who presented to the emergency department due to shortness of breath.  Patient has history of interstitial lung disease and follows with pulmonology.  She is thought to have chronic hypersensitive pneumonitis.  She was started on CellCept  100 mg twice daily and is on prednisone  with a taper.  They recommended that she present to the emergency department due to her desaturations.  On arrival to the emergency department she was afebrile hemodynamically stable.  Pulmonology evaluated the patient and plans for bronchoscopy in the morning.  Labs were obtained which showed glucose 234, creatinine 0.89, WBC 13.5, hemoglobin 13.7, troponin within normal limits, BNP 89, respiratory viral panel negative.  Lactic acid 2.1.  Patient underwent chest imaging which showed no acute infiltrates.  CTA pulmonary embolism study demonstrated small bilateral PEs.  Patient was started on heparin  drip and admitted for further workup.   Review of Systems: Review of Systems  Constitutional:  Negative for chills and fever.  HENT: Negative.    Eyes: Negative.   Respiratory:  Positive for cough and shortness of breath.   Cardiovascular: Negative.   Gastrointestinal: Negative.   Genitourinary: Negative.   Musculoskeletal: Negative.   Skin: Negative.   Neurological: Negative.   Endo/Heme/Allergies: Negative.   Psychiatric/Behavioral: Negative.       As per HPI otherwise 10 point review of systems negative.   Allergies  Allergen Reactions   Ozempic (0.25 Or 0.5 Mg-Dose) [Semaglutide(0.25 Or 0.5mg -Dos)] Other (See Comments)    Paralyzed the stomach   Tape Other (See Comments)    SKIN IS VERY FRAGILE!! Burns and  pulls up the skin!! SKIN BRUISES and TEARS EASILY.SABRA   Betadine [Povidone Iodine] Other (See Comments)    Blisters    Codeine Nausea And Vomiting   Garlic Diarrhea   Hydrocodone  Nausea And Vomiting   Macrobid [Nitrofurantoin] Other (See Comments)    Instructed by dr not to take    Ofev  [Nintedanib] Other (See Comments)    Abdominal pain   Onion Diarrhea   Shellfish Allergy Nausea And Vomiting   Statins Other (See Comments)    Leg pain, but Pravachol  is tolerated   Sulfa  Antibiotics Other (See Comments)    Headaches     Past Medical History:  Diagnosis Date   Allergy    Arthritis    history spinal stenosis. osteoarthritis right hip   Asthma    Cataract    Coronary artery calcification seen on CAT scan 08/19/2017   >300 on CT scan 08/2017   DDD (degenerative disc disease), thoracic    Depression    DOE (dyspnea on exertion)    a. 04/2010 Lexi MV EF 71%, no ischemia/infarct;     Fatty liver    GERD (gastroesophageal reflux disease)    H/O steroid therapy    Steroid use orally over 4 yrs- for Lung Fibrosis   Heart palpitations 02/28/2015   Helicobacter pylori ab+    Hemorrhoids    Hiatal hernia    High cholesterol    History of chronic bronchitis    as child   History of migraine    History of MRSA infection    Hyperlipidemia, mixed 08/19/2017   Hyperplastic colon polyp 2007   IBS (irritable bowel syndrome)  Inguinal hernia    right   Insulin  resistance    past   Interstitial lung disease (HCC)    MVP (mitral valve prolapse)    Posterior mitral valve leaflet with mild MR   Pneumonia    Pneumonitis, hypersensitivity (HCC)    a. 09/2012 s/p Bx - ? 2/2 bird, mold, oil paint exposure ->on steroids, followed by pulm.   PONV (postoperative nausea and vomiting)    Pre-diabetes    takes Metformin    Pulmonary fibrosis (HCC)    Dr. Geronimo follows- stable at present   PVC (premature ventricular contraction) 08/19/2017   Rapid heart rate    Dr. Shlomo follows-  last visit Epic note 9'16   Squamous cell carcinoma of skin    Tinnitus, right ear    Vocal cord ulcer     Past Surgical History:  Procedure Laterality Date   73 HOUR PH STUDY N/A 02/21/2018   Procedure: 24 HOUR PH STUDY;  Surgeon: Shila Gustav GAILS, MD;  Location: [facility];  Service: Endoscopy;  Laterality: N/A;   BREAST BIOPSY Right 2009   BIOPSY, pt denies   BREAST BIOPSY Left 2003   Benign    CATARACT EXTRACTION Left    CESAREAN SECTION     COLONOSCOPY     ESOPHAGEAL MANOMETRY N/A 02/21/2018   Procedure: ESOPHAGEAL MANOMETRY (EM);  Surgeon: Shila Gustav GAILS, MD;  Location: [facility];  Service: Endoscopy;  Laterality: N/A;   FOOT FRACTURE SURGERY  2006 or 2007   right   HYMENECTOMY     LUNG BIOPSY  09/28/2012   Procedure: LUNG BIOPSY;  Surgeon: Elspeth JAYSON Millers, MD;  Location: [facility] OR;  Service: Thoracic;  Laterality: N/A;  lung biopsies tims three   RIGHT HEART CATH N/A 04/04/2024   Procedure: RIGHT HEART CATH;  Surgeon: Rolan Ezra RAMAN, MD;  Location: [facility] INVASIVE CV LAB;  Service: Cardiovascular;  Laterality: N/A;   SQUAMOUS CELL CARCINOMA EXCISION Left    left arm   TOTAL HIP ARTHROPLASTY Right 09/10/2015   Procedure: RIGHT TOTAL HIP ARTHROPLASTY ANTERIOR APPROACH;  Surgeon: Donnice Car, MD;  Location: [facility] ORS;  Service: Orthopedics;  Laterality: Right;   TOTAL HIP ARTHROPLASTY Left 10/03/2019   Procedure: TOTAL HIP ARTHROPLASTY ANTERIOR APPROACH;  Surgeon: Car Donnice, MD;  Location: [facility] ORS;  Service: Orthopedics;  Laterality: Left;  70 mins   TUBAL LIGATION     UPPER GI ENDOSCOPY     VIDEO ASSISTED THORACOSCOPY  09/28/2012   Procedure: VIDEO ASSISTED THORACOSCOPY;  Surgeon: Elspeth JAYSON Millers, MD;  Location[facility] OR;  Service: Thoracic;  Laterality: Right;   VIDEO BRONCHOSCOPY  11/19/2011   Procedure: VIDEO BRONCHOSCOPY WITH FLUORO;  Surgeon: Dorethia Geronimo, MD;  Location: [facility] ENDOSCOPY;  Service: Endoscopy;;     reports that she has never smoked. She has  never used smokeless tobacco. She reports current alcohol use of about 2.0 standard drinks of alcohol per week. She reports that she does not use drugs.  Family History  Problem Relation Age of Onset   Asthma Mother    Osteoarthritis Mother    Dementia Mother 40   Lymphoma Father    Diabetes Father    Hypertension Sister    Heart disease Sister    Kidney disease Sister    Allergic rhinitis Daughter    Ovarian cancer Maternal Aunt    Breast cancer Paternal Aunt    Breast cancer Paternal Aunt    Emphysema Maternal Grandmother    Asthma Maternal Grandmother  Lung disease Maternal Grandfather    Bone cancer Paternal Grandfather    Breast cancer Cousin    Breast cancer Cousin    Breast cancer Cousin    Colon cancer Neg Hx    Angioedema Neg Hx    Eczema Neg Hx    Immunodeficiency Neg Hx    Urticaria Neg Hx     Prior to Admission medications   Medication Sig Start Date End Date Taking? Authorizing Provider  albuterol  (PROAIR  HFA) 108 (90 Base) MCG/ACT inhaler Inhale 2 puffs into the lungs every 4 (four) hours as needed for wheezing. Or coughing spells.  You may use 2 Puffs 5-10 minutes before exercise. 03/17/24   Amin, Ankit C, MD  Alpha-D-Galactosidase (BEANO PO) Take 1 tablet by mouth as needed (as directed, if eating onion or garlic).    [provider]  AMBULATORY NON FORMULARY MEDICATION See admin instructions. Allergy injections on Mondays and hold when sick    [provider]  ascorbic acid (VITAMIN C) 500 MG tablet Take 500 mg by mouth daily.    [provider]  ASHWAGANDHA PO Take 1 capsule by mouth every other day.    [provider]  budesonide  (PULMICORT ) 0.5 MG/2ML nebulizer solution Take 2 mLs (0.5 mg total) by nebulization in the morning and at bedtime. 03/17/24   Amin, Burgess BROCKS, MD  Calcium  Carb-Cholecalciferol (CALCIUM  500 + D PO) Take 1 tablet by mouth daily.    [provider]  chlorpheniramine (CHLOR-TRIMETON) 4 MG tablet  Take 2 mg by mouth 2 (two) times daily as needed for allergies or rhinitis.    [provider]  cholecalciferol (VITAMIN D3) 25 MCG (1000 UNIT) tablet Take 1,000 Units by mouth every other day.    [provider]  Continuous Glucose Sensor (DEXCOM G7 SENSOR) MISC Inject 1 Device into the skin See admin instructions. Place one new sensor into the skin every 10 days    [provider]  Continuous Glucose Sensor (DEXCOM G7 SENSOR) MISC Check blood sugar fasting and before meals and at bedtime. 04/23/24     dicyclomine  (BENTYL ) 10 MG capsule Take 10 mg by mouth daily as needed for spasms. 08/16/18   [provider]  diltiazem  (CARDIZEM  CD) 120 MG 24 hr capsule Take 1 capsule (120 mg total) by mouth daily. 03/24/24   Lavona Agent, MD  diphenhydrAMINE -PE-APAP (DELSYM COUGH/COLD NIGHT TIME) 12.5-5-325 MG/10ML LIQD Take 10 mLs by mouth every 12 (twelve) hours as needed (for coughing).    [provider]  EPINEPHrine  0.3 mg/0.3 mL IJ SOAJ injection Inject 0.3 mg into the muscle as needed for anaphylaxis.    [provider]  ezetimibe  (ZETIA ) 10 MG tablet Take 1 tablet (10 mg total) by mouth daily. 02/10/24   Lavona Agent, MD  fluticasone  (FLONASE ) 50 MCG/ACT nasal spray Spray 2 sprays into each nostril every day. 03/06/24   Geronimo Amel, MD  fluticasone  furoate-vilanterol (BREO ELLIPTA ) 100-25 MCG/ACT AEPB Inhale 1 puff into the lungs daily. 03/17/24   Amin, Ankit C, MD  HUMALOG  KWIKPEN 100 UNIT/ML KwikPen Inject 6 Units into the skin 3 (three) times daily before meals. 01/27/24   [provider]  Hypromellose (NATURES TEARS OP) Place 1 drop into both eyes in the morning and at bedtime. Patient not taking: Reported on 05/05/2024    [provider]  ipratropium-albuterol  (DUONEB) 0.5-2.5 (3) MG/3ML SOLN Take 3 mLs by nebulization every 6 (six) hours as needed (for shortness of breath or wheezing or when  sick). 03/17/24   Caleen Burgess BROCKS, MD   LANTUS  SOLOSTAR 100 UNIT/ML Solostar Pen Inject 20 Units into the skin daily before breakfast. 01/03/24   [provider]  levocetirizine (XYZAL ) 5 MG tablet Take 5 mg by mouth at bedtime. 09/26/20   [provider]  metFORMIN  (GLUCOPHAGE -XR) 500 MG 24 hr tablet Take 500 mg by mouth daily with breakfast. 07/08/23   [provider]  metoprolol  succinate (TOPROL -XL) 25 MG 24 hr tablet Take 1 tablet (25 mg total) by mouth daily. 06/10/23   Lavona Agent, MD  montelukast  (SINGULAIR ) 10 MG tablet TAKE 1 TABLET BY MOUTH EVERYDAY AT BEDTIME 01/29/20   Geronimo Amel, MD  Multiple Vitamin (MULTIVITAMIN) tablet Take 1 tablet by mouth daily with breakfast.    [provider]  mycophenolate  (CELLCEPT ) 500 MG tablet Take 1 tab twice daily x 2 weeks. REPEAT LABS. If stable, increase to 2 tabs twice daily x 2 weeks. REPEAT LABS. If stable, increase to 3 tabs twice daily 02/07/24   Geronimo Amel, MD  nitroGLYCERIN  (NITROSTAT ) 0.4 MG SL tablet Place 1 tablet (0.4 mg total) under the tongue every 5 (five) minutes as needed for chest pain. 12/06/18 05/05/24  Kroeger, Krista M., PA-C  omeprazole  (PRILOSEC  OTC) 20 MG tablet Take 10 mg by mouth daily before breakfast.    [provider]  OXYGEN  Inhale 4-5 L/min into the lungs See admin instructions. Inhale 4-5 L/min at bedtime and as needed for shortness of breath during the day    [provider]  pravastatin  (PRAVACHOL ) 40 MG tablet Take 1 tablet (40 mg total) by mouth every evening. 06/12/20   Lavona Agent, MD  predniSONE  (DELTASONE ) 10 MG tablet Take 6 tablets (60 mg total) by mouth daily with breakfast for 14 days, THEN 4 tablets (40 mg total) daily with breakfast for 14 days, THEN 3 tablets (30 mg total) daily with breakfast for 14 days, THEN 2 tablets (20 mg total) daily with breakfast for 14 days, THEN 1 tablet (10 mg total) daily with breakfast. 03/18/24 06/12/24  Amin, Ankit C, MD  sodium chloride  (OCEAN) 0.65  % SOLN nasal spray Place 1 spray into both nostrils as needed for congestion.    [provider]  Sodium Chloride , Inhalant, 7 % NEBU Use per nebulizer once a day to help clear sputum 07/08/21   Geronimo Amel, MD    Physical Exam: Vitals:   05/22/24 1218 05/22/24 1220 05/22/24 1515 05/22/24 1640  BP: (!) 144/85  (!) 141/71   Pulse: 78  73   Resp: 18  20   Temp: 98.1 F (36.7 C)   98.4 F (36.9 C)  TempSrc: Oral   Oral  SpO2: 96%  98%   Weight:  66.2 kg     Physical Exam Constitutional:      Appearance: She is normal weight.  HENT:     Nose: Nose normal.     Mouth/Throat:     Mouth: Mucous membranes are moist.     Pharynx: Oropharynx is clear.   Eyes:     Conjunctiva/sclera: Conjunctivae normal.    Cardiovascular:     Rate and Rhythm: Normal rate and regular rhythm.  Pulmonary:     Effort: Pulmonary effort is normal.     Breath sounds: Normal breath sounds.  Abdominal:     General: Abdomen is flat.   Musculoskeletal:        General: Normal range of motion.     Cervical back: Normal range of motion.  Skin:    General: Skin is warm.     Capillary Refill: Capillary refill takes less than 2 seconds.   Neurological:     Mental Status: She is alert.   Psychiatric:        Mood and Affect: Mood normal.        Labs on Admission: I have personally reviewed the patients's labs and imaging studies.  Assessment/Plan Principal Problem:   Acute hypoxic respiratory failure (HCC)   # Acute on chronic hypoxic respiratory failure most likely secondary to interstitial lung disease flare and/or PE #subsegmental PE - Patient had CTA pulm embolism study in the emergency department which showed subsegmental PE and was started on Lovenox  - Saw pulmonology who recommended initiation of IV Solu-Medrol   Plan: Solu-Medrol  125 mg twice daily per pulmonology recommendations N.p.o. at midnight for possible bronchoscopy tomorrow Continue CellCept  Continue ceftriaxone  and azithromycin  Hep gtt Pulmicort  Brovana  # Hypertension-continue diltiazem   # Type 2 diabetes-continue Lantus , lispro  #HLD- continue zetia , statin   Admission status: Inpatient Telemetry  Certification: The appropriate patient status for this patient is INPATIENT. Inpatient status is judged to be reasonable and necessary in order to provide the required intensity of service to ensure the patient's safety. The patient's presenting symptoms, physical exam findings, and initial radiographic and laboratory data in the context of their chronic comorbidities is felt to place them at high risk for further clinical deterioration. Furthermore, it is not anticipated that the patient will be medically stable for discharge from the hospital within 2 midnights of admission.   * I certify that at the point of admission it is my clinical judgment that the patient will require inpatient hospital care spanning beyond 2 midnights from the point of admission due to high intensity of service, high risk for further deterioration and high frequency of surveillance required.DEWAINE Lamar Dess MD [facility] If 7PM-7AM, please contact night-coverage [email or web address]  05/22/2024, 6:29 PM

## 2024-05-22 NOTE — Telephone Encounter (Signed)
 Rhonda [name]   Over time she has had progressive phenotype with hypersensitive pneumonitis related pulmonary fibrosis/interstitial lung disease.  Just over the years she would have respiratory exacerbations that are weather related.  And also over time she is needed increasing baseline doses of prednisone .  This year she is taken a turn for the worse particularly after recent rhinovirus exacerbation.  She even had a hospitalization.  After that she seemed to improve with treatment for empiric PJP.  We then committed her to a prednisone  taper and CellCept .  Then approximately 10 days ago when she brought her prednisone  down to 10 mg.  She started desaturating easily.  I recommended admission and consideration of switching over to IV Cytoxan IV Rituxan.  However she had to go to the beach and refused.   So I recommended as an alternative 60 mg prednisone  daily.  This was approximately 10 days ago.  Soon after she took the first dose of hypoxemia improved.  She then went to the beach she reported she was doing well.  Today wanted to touch base and bring it down to 40 mg/day but she said she is now desaturating easily and she also has green sputum.  She says when she was on Bactrim  for PJP she was not having green sputum.  Currently believes she is on Bactrim  just as prophylaxis.  Desaturations happening with exertion.  I recommend she go to [facility] emergency department for evaluation.  Dw Dr Andrea Ness -definitely needs admission but requires triage for etiology and also stepdown versus floor.  Most likely she can go to the floor and CCM consult.    She is having hypoglycemic symptoms with high-dose steroids but this is better now Might have to consider pulsed dose steroids this admission Might need bronchoscopy with lavage if she can tolerate it. If no evidence of sepsis then we will have to consider -IV Cytoxan IV Rituxan   Sending this message to Dr. Theophilus for review because he is a  [facility] hospitalist physician for the pulmonary service     SIGNATURE    Dr. Dorethia Cave, M.D., F.C.C.P,  Pulmonary and Critical Care Medicine Staff Physician, [facility] Director - Interstitial Lung Disease  Program  Pulmonary Fibrosis [facility] at [facility] [place], [place], [address]   Pager: [phone], If no answer  -> Check AMION or Try [phone] Telephone (clinical office): [phone] Telephone (research): [phone]  12:15 PM 05/22/2024

## 2024-05-22 NOTE — ED Triage Notes (Signed)
 POV/ pt drove self/ on 4-5L O2 at home/ hx of interstitial lung disease/ 80% sats at home/ labored breathing/ pt A&Ox4/

## 2024-05-23 ENCOUNTER — Inpatient Hospital Stay (HOSPITAL_COMMUNITY): Admitting: Certified Registered"

## 2024-05-23 ENCOUNTER — Ambulatory Visit: Admitting: Cardiology

## 2024-05-23 ENCOUNTER — Encounter (HOSPITAL_COMMUNITY)
Admission: EM | Disposition: A | Discharge status: Home Health | Payer: Self-pay | Source: Ambulatory Visit | Attending: Internal Medicine

## 2024-05-23 ENCOUNTER — Encounter (HOSPITAL_COMMUNITY): Payer: Self-pay | Admitting: Internal Medicine

## 2024-05-23 ENCOUNTER — Encounter (HOSPITAL_COMMUNITY)

## 2024-05-23 DIAGNOSIS — I340 Nonrheumatic mitral (valve) insufficiency: Secondary | ICD-10-CM | POA: Diagnosis not present

## 2024-05-23 DIAGNOSIS — J679 Hypersensitivity pneumonitis due to unspecified organic dust: Secondary | ICD-10-CM | POA: Diagnosis not present

## 2024-05-23 DIAGNOSIS — J9621 Acute and chronic respiratory failure with hypoxia: Secondary | ICD-10-CM | POA: Diagnosis not present

## 2024-05-23 DIAGNOSIS — I2510 Atherosclerotic heart disease of native coronary artery without angina pectoris: Secondary | ICD-10-CM

## 2024-05-23 DIAGNOSIS — E119 Type 2 diabetes mellitus without complications: Secondary | ICD-10-CM

## 2024-05-23 DIAGNOSIS — J849 Interstitial pulmonary disease, unspecified: Secondary | ICD-10-CM | POA: Diagnosis not present

## 2024-05-23 DIAGNOSIS — Z794 Long term (current) use of insulin: Secondary | ICD-10-CM | POA: Diagnosis not present

## 2024-05-23 DIAGNOSIS — J9601 Acute respiratory failure with hypoxia: Secondary | ICD-10-CM | POA: Diagnosis not present

## 2024-05-23 HISTORY — PX: VIDEO BRONCHOSCOPY: SHX5072

## 2024-05-23 LAB — BODY FLUID CELL COUNT WITH DIFFERENTIAL
Eos, Fluid: 1 %
Eos, Fluid: 2 %
Lymphs, Fluid: 0 %
Lymphs, Fluid: 0 %
Monocyte-Macrophage-Serous Fluid: 50 % (ref 50–90)
Monocyte-Macrophage-Serous Fluid: 57 % (ref 50–90)
Neutrophil Count, Fluid: 42 % — ABNORMAL HIGH (ref 0–25)
Neutrophil Count, Fluid: 48 % — ABNORMAL HIGH (ref 0–25)
Total Nucleated Cell Count, Fluid: 130 uL (ref 0–1000)

## 2024-05-23 LAB — CBC
HCT: 44.2 % (ref 36.0–46.0)
Hemoglobin: 13.6 g/dL (ref 12.0–15.0)
MCH: 28 pg (ref 26.0–34.0)
MCHC: 30.8 g/dL (ref 30.0–36.0)
MCV: 91.1 fL (ref 80.0–100.0)
Platelets: 199 10*3/uL (ref 150–400)
RBC: 4.85 MIL/uL (ref 3.87–5.11)
RDW: 18.7 % — ABNORMAL HIGH (ref 11.5–15.5)
WBC: 16.5 10*3/uL — ABNORMAL HIGH (ref 4.0–10.5)
nRBC: 0 % (ref 0.0–0.2)

## 2024-05-23 LAB — GLUCOSE, CAPILLARY
Glucose-Capillary: 228 mg/dL — ABNORMAL HIGH (ref 70–99)
Glucose-Capillary: 177 mg/dL — ABNORMAL HIGH (ref 70–99)
Glucose-Capillary: 216 mg/dL — ABNORMAL HIGH (ref 70–99)
Glucose-Capillary: 335 mg/dL — ABNORMAL HIGH (ref 70–99)
Glucose-Capillary: 147 mg/dL — ABNORMAL HIGH (ref 70–99)

## 2024-05-23 LAB — HEPARIN LEVEL (UNFRACTIONATED): Heparin Unfractionated: >1.1 [IU]/mL — ABNORMAL HIGH (ref 0.30–0.70)

## 2024-05-23 SURGERY — VIDEO BRONCHOSCOPY WITHOUT FLUORO
Anesthesia: General | Laterality: Bilateral

## 2024-05-23 MED ORDER — HEPARIN (PORCINE) 25000 UT/250ML-% IV SOLN
650.0000 [IU]/h | INTRAVENOUS | Status: DC
  Administered 2024-05-23: 750 [IU]/h via INTRAVENOUS

## 2024-05-23 MED ORDER — SODIUM CHLORIDE 0.9 % IV SOLN
INTRAVENOUS | Status: AC | PRN
  Administered 2024-05-23: 500 mL via INTRAMUSCULAR

## 2024-05-23 MED ORDER — SUGAMMADEX SODIUM 200 MG/2ML IV SOLN
INTRAVENOUS | Status: DC | PRN
  Administered 2024-05-23 (×2): 200 mg via INTRAVENOUS

## 2024-05-23 MED ORDER — FENTANYL CITRATE (PF) 100 MCG/2ML IJ SOLN
INTRAMUSCULAR | Status: DC | PRN
  Administered 2024-05-23: 25 ug via INTRAVENOUS

## 2024-05-23 MED ORDER — FENTANYL CITRATE (PF) 100 MCG/2ML IJ SOLN
INTRAMUSCULAR | Status: AC
Start: 2024-05-23 — End: 2024-05-23
  Filled 2024-05-23: qty 2

## 2024-05-23 MED ORDER — SUCCINYLCHOLINE CHLORIDE 200 MG/10ML IV SOSY
PREFILLED_SYRINGE | INTRAVENOUS | Status: DC | PRN
Start: 2024-05-23 — End: 2024-05-23
  Administered 2024-05-23: 100 mg via INTRAVENOUS

## 2024-05-23 MED ORDER — CHLORHEXIDINE GLUCONATE 0.12 % MT SOLN
15.0000 mL | Freq: Once | OROMUCOSAL | Status: AC
  Administered 2024-05-23: 15 mL via OROMUCOSAL

## 2024-05-23 MED ORDER — FAMOTIDINE 20 MG PO TABS
20.0000 mg | ORAL_TABLET | Freq: Two times a day (BID) | ORAL | Status: DC
  Administered 2024-05-23 – 2024-05-30 (×13): 20 mg via ORAL
  Filled 2024-05-23 (×13): qty 1

## 2024-05-23 MED ORDER — CHLORHEXIDINE GLUCONATE 0.12 % MT SOLN
OROMUCOSAL | Status: AC
  Filled 2024-05-23: qty 15

## 2024-05-23 MED ORDER — ALPRAZOLAM 0.25 MG PO TABS
0.2500 mg | ORAL_TABLET | Freq: Every evening | ORAL | Status: DC | PRN
  Administered 2024-05-26 – 2024-05-29 (×4): 0.25 mg via ORAL
  Filled 2024-05-23 (×4): qty 1

## 2024-05-23 MED ORDER — LIDOCAINE HCL (CARDIAC) PF 100 MG/5ML IV SOSY
PREFILLED_SYRINGE | INTRAVENOUS | Status: DC | PRN
  Administered 2024-05-23: 60 mg via INTRAVENOUS

## 2024-05-23 MED ORDER — PANTOPRAZOLE SODIUM 40 MG PO TBEC: 40.0000 mg | DELAYED_RELEASE_TABLET | Freq: Every day | ORAL | Status: DC

## 2024-05-23 MED ORDER — ROCURONIUM BROMIDE 100 MG/10ML IV SOLN
INTRAVENOUS | Status: DC | PRN
  Administered 2024-05-23: 5 mg via INTRAVENOUS

## 2024-05-23 MED ORDER — PROPOFOL 10 MG/ML IV BOLUS
INTRAVENOUS | Status: DC | PRN
  Administered 2024-05-23: 100 mg via INTRAVENOUS

## 2024-05-23 NOTE — Anesthesia Procedure Notes (Signed)
 Procedure Name: Intubation Date/Time: 05/23/2024 2:03 PM  Performed by: Delores Duwaine SAUNDERS, CRNAPre-anesthesia Checklist: Patient identified, Emergency Drugs available, Suction available and Patient being monitored Patient Re-evaluated:Patient Re-evaluated prior to induction Oxygen  Delivery Method: Circle System Utilized Preoxygenation: Pre-oxygenation with 100% oxygen  Induction Type: IV induction Ventilation: Mask ventilation without difficulty Laryngoscope Size: Glidescope and 3 Grade View: Grade I Tube type: Oral Number of attempts: 1 Airway Equipment and Method: Stylet and Oral airway Placement Confirmation: ETT inserted through vocal cords under direct vision, positive ETCO2 and breath sounds checked- equal and bilateral Tube secured with: Tape Dental Injury: Teeth and Oropharynx as per pre-operative assessment  Difficulty Due To: Difficulty was anticipated Comments: Hx of anterior airway, small mouth opening; straight to glide for hx vocal cord issues after previous intubation

## 2024-05-23 NOTE — Progress Notes (Signed)
 PHARMACY - ANTICOAGULATION CONSULT NOTE  Pharmacy Consult for heparin  Indication: pulmonary embolus  Allergies  Allergen Reactions   Ozempic (0.25 Or 0.5 Mg-Dose) [Semaglutide(0.25 Or 0.5mg -Dos)] Other (See Comments)    Paralyzed the stomach   Tape Other (See Comments)    SKIN IS VERY FRAGILE!! Burns and pulls up the skin!! SKIN BRUISES and TEARS EASILY.SABRA   Betadine [Povidone Iodine] Other (See Comments)    Blisters    Codeine Nausea And Vomiting   Garlic Diarrhea   Hydrocodone  Nausea And Vomiting   Iodine Other (See Comments)    Blisters    Macrobid [Nitrofurantoin] Other (See Comments)    Instructed by dr not to take    Ofev  [Nintedanib] Other (See Comments)    Abdominal pain   Onion Diarrhea   Shellfish Allergy Nausea And Vomiting   Statins Other (See Comments)    Leg pain, but Pravachol  is tolerated   Sulfa  Antibiotics Other (See Comments)    Headaches     Patient Measurements: Height: 4' 11 (149.9 cm) Weight: 67.1 kg (148 lb) IBW/kg (Calculated) : 43.2 HEPARIN  DW (KG): 57.9  Vital Signs: Temp: 98.3 F (36.8 C) (07/01 1309) Temp Source: Temporal (07/01 1446) BP: 135/64 (07/01 1520) Pulse Rate: 79 (07/01 1520)  Labs: Recent Labs    05/22/24 1301 05/22/24 1522 05/23/24 0542 05/23/24 1053  HGB 13.7  --  13.6  --   HCT 44.8  --  44.2  --   PLT 238  --  199  --   HEPARINUNFRC  --   --   --  >1.10*  CREATININE 0.89  --   --   --   TROPONINIHS 5 4  --   --     Estimated Creatinine Clearance: 45.5 mL/min (by C-G formula based on SCr of 0.89 mg/dL).   Medical History: Past Medical History:  Diagnosis Date   Allergy    Arthritis    history spinal stenosis. osteoarthritis right hip   Asthma    Cataract    Coronary artery calcification seen on CAT scan 08/19/2017   >300 on CT scan 08/2017   DDD (degenerative disc disease), thoracic    Depression    DOE (dyspnea on exertion)    a. 04/2010 Lexi MV EF 71%, no ischemia/infarct;     Fatty liver     GERD (gastroesophageal reflux disease)    H/O steroid therapy    Steroid use orally over 4 yrs- for Lung Fibrosis   Heart palpitations 02/28/2015   Helicobacter pylori ab+    Hemorrhoids    Hiatal hernia    High cholesterol    History of chronic bronchitis    as child   History of migraine    History of MRSA infection    Hyperlipidemia, mixed 08/19/2017   Hyperplastic colon polyp 2007   IBS (irritable bowel syndrome)    Inguinal hernia    right   Insulin  resistance    past   Interstitial lung disease (HCC)    MVP (mitral valve prolapse)    Posterior mitral valve leaflet with mild MR   Pneumonia    Pneumonitis, hypersensitivity (HCC)    a. 09/2012 s/p Bx - ? 2/2 bird, mold, oil paint exposure ->on steroids, followed by pulm.   PONV (postoperative nausea and vomiting)    Pre-diabetes    takes Metformin    Pulmonary fibrosis (HCC)    Dr. Geronimo follows- stable at present   PVC (premature ventricular contraction) 08/19/2017   Rapid heart rate  Dr. Shlomo follows- last visit Epic note 9'16   Squamous cell carcinoma of skin    Tinnitus, right ear    Vocal cord ulcer     Medications: No anticoagulants PTA  Assessment: Pt is a 26 yoF presenting with SOB/respiratory distress. PMH significant for chronic hypersensitivity pneumonitis related to ILD. CTA revealed small bilateral PE - minimal clot burden with no evidence of RHS. Pharmacy consulted to dose heparin .   Today, 05/23/24 CBC WNL Heparin  level was supratherapeutic this morning on heparin  infusion of 950 units/hr  Heparin  was stopped at noon today for bronch. Per CCM, heparin  can be resumed as soon as pt returns to the floor post-procedure.   Goal of Therapy:  Heparin  level 0.3-0.7 units/ml Monitor platelets by anticoagulation protocol: Yes   Plan:  No bolus post-procedure Resume heparin  at reduced rate of 750 units/hr Check 8 hour heparin  level CBC, heparin  level daily Monitor for signs of  bleeding  Ronal CHRISTELLA Rav, PharmD 05/23/2024,4:07 PM

## 2024-05-23 NOTE — Progress Notes (Signed)
 NAME:  Rhonda [name], MRN:  [id number], DOB:  December 31, 1950, LOS: 1 ADMISSION DATE:  05/22/2024, CONSULTATION DATE:  05/22/2024 REFERRING MD:  Dr. Lenor-  EDP, CHIEF COMPLAINT:  Acute on chronic hypoxic respiratory failure    History of Present Illness:  Rhonda [name] is a 73 y.o. female with PMH significant for  hypersensitive pneumonitis related pulmonary fibrosis/interstitial lung disease immunosuppressed at baseline with chronic steroids, CAD, HTN, HLD, and steroids induce diabetes who presented to the ED at the direction of primary pulmonologist for complaints of hypoxic and change in sputum production.   Of note patient had had a progressive decline in respiratory status over that last year, with worst decline seen after suffering a rhinovirus infection in April. After this admission she was started on Prednisone  taper and CellCept , she tolerated this well until her steroids got to 10 of Prednisone  daily. At this time she began to desaturate quickly. He primary pulmonologist (Dr. Geronimo) wished to admit for further management but patient refused and decision was made to increase steroids back to 60mg  daily and symptoms resolved until 6/30 when hypoxic returned and sputum production changed.   Pertinent  Medical History  Hypersensitive pneumonitis related pulmonary fibrosis/interstitial lung disease immunosuppressed at baseline with chronic steroids, CAD, HTN, HLD, and steroids induce diabetes  Significant Hospital Events: Including procedures, antibiotic start and stop dates in addition to other pertinent events   6/30 Presented with more subjective hypoxia with and change in sputum production.  Started on heparin  for segmental PE  Interim History / Subjective:   No new complaints today.  Objective    Blood pressure 139/67, pulse 75, temperature 98 F (36.7 C), temperature source Oral, resp. rate 18, height 4' 11 (1.499 m), weight 68.2 kg, SpO2 99%.       No intake or output  data in the 24 hours ending 05/23/24 0918 Filed Weights   05/22/24 1220 05/23/24 0856  Weight: 66.2 kg 68.2 kg    Examination: Blood pressure 139/67, pulse 75, temperature 98 F (36.7 C), temperature source Oral, resp. rate 18, height 4' 11 (1.499 m), weight 68.2 kg, SpO2 99%. Gen:      No acute distress HEENT:  EOMI, sclera anicteric Neck:     No masses; no thyromegaly Lungs:    Clear to auscultation bilaterally; normal respiratory effort CV:         Regular rate and rhythm; no murmurs Abd:      + bowel sounds; soft, non-tender; no palpable masses, no distension Ext:    No edema; adequate peripheral perfusion Neuro: alert and oriented x 3 Psych: normal mood and affect   Resolved problem list   Assessment and Plan  Acute on chronic hypoxic respiratory failure Hypersensitivity pneumonitis  Chronic hypersensitivity pneumonitis, with recent exacerbation. Symptoms include decreased oxygen  saturation upon exertion. Previous treatments with doxycycline  and steroids were effective, but recent exacerbations have been resistant to multiple antibiotic and steroid cycles. Differential includes worsening of interstitial lung disease or new infection. Concerns about potential new infection or progression of interstitial lung disease. - Perform bronchoscopy with lavage to rule out infection before considering IV Rituxan or Cytoxan.  Risk/benefit reviewed with patient she has agreed to proceed - Administer IV Solumedrol 125 mg twice daily.  Continue Bactrim  for prophylaxis - Administer IV antibiotics for CAP coverage   Continue home Noted on CT angiogram.  Continue IV heparin  Check lower extremity duplex   Insulin -dependent diabetes mellitus  Insulin -dependent diabetes mellitus secondary to chronic steroid use.  Concerns about blood sugar management with increased steroid dosage. Hospitalization will require frequent monitoring and adjustment of insulin .  - Monitor blood sugar every four hours  while hospitalized  - Adjust insulin  as needed    Vision changes  Vision changes characterized by blurriness and difficulty focusing. Ophthalmologist evaluation ruled out diabetic retinopathy. Possible association with fluctuating blood sugar levels and steroid use.  - Monitor vision changes in relation to blood sugar and steroid levels    Best Practice (right click and Reselect all SmartList Selections daily)   Per primary   Signature:   Mar Zettler MD [facility] See Amion for pager  If no response to pager , please call [phone] until 7pm After 7:00 pm call Elink  [phone] 05/23/2024, 9:20 AM

## 2024-05-23 NOTE — Progress Notes (Addendum)
 PROGRESS NOTE    Rhonda [name]  FMW:[id number] DOB: 02/21/1951 DOA: 05/22/2024 PCP: Geronimo Amel, MD    Brief Narrative:  Rhonda [name] is a 73 y.o. female with medical history significant of interstitial lung disease, CAD, GERD, hyperlipidemia, hypertension who presented to the emergency department due to shortness of breath.  Patient has history of interstitial lung disease and follows with pulmonology.  She is thought to have chronic hypersensitive pneumonitis.  She was started on CellCept  100 mg twice daily and is on prednisone  with a taper.  They recommended that she present to the emergency department due to her desaturations.  On arrival to the emergency department she was afebrile hemodynamically stable.  Pulmonology evaluated the patient and plans for bronchoscopy in the morning.  Labs were obtained which showed glucose 234, creatinine 0.89, WBC 13.5, hemoglobin 13.7, troponin within normal limits, BNP 89, respiratory viral panel negative.  Lactic acid 2.1.  Patient underwent chest imaging which showed no acute infiltrates.  CTA pulmonary embolism study demonstrated small bilateral PEs.  Patient was started on heparin  drip and admitted for further workup.  Plan for bronchoscopy on 7/1     Assessment and Plan: # Acute on chronic hypoxic respiratory failure most likely secondary to interstitial lung disease flare and/or PE #subsegmental PE - Patient had CTA pulm embolism study in the emergency department which showed subsegmental PE and was started on heparin  drip -Steroids/antibiotics - Bronchoscopy on 7/1 -Continue CellCept  - Nebs   # Hypertension -continue diltiazem    # Type 2 diabetes -continue Lantus , lispro -Hold on pump for now   #HLD - continue zetia , statin      DVT prophylaxis: SCDs Start: 05/22/24 1756    Code Status: Full Code Family Communication:   Disposition Plan:  Level of care: Telemetry Status is: Inpatient   Consultants:     Subjective: Hungry  Objective: Vitals:   05/23/24 0752 05/23/24 0856 05/23/24 0935 05/23/24 0937  BP:  139/67 134/73 134/73  Pulse:  75 72 72  Resp:  18    Temp:  98 F (36.7 C)    TempSrc:  Oral    SpO2: 99%     Weight:  68.2 kg    Height:  4' 11 (1.499 m)     No intake or output data in the 24 hours ending 05/23/24 1234 Filed Weights   05/22/24 1220 05/23/24 0856  Weight: 66.2 kg 68.2 kg    Examination:   General: Appearance:    Obese female in no acute distress     Lungs:     respirations unlabored diminished  Heart:    Normal heart rate. Normal rhythm.      MS:   All extremities are intact.    Neurologic:   Awake, alert, oriented x 3. No apparent focal neurological           defect.        Data Reviewed: I have personally reviewed following labs and imaging studies  CBC: Recent Labs  Lab 05/22/24 1301 05/23/24 0542  WBC 15.5* 16.5*  HGB 13.7 13.6  HCT 44.8 44.2  MCV 90.5 91.1  PLT 238 199   Basic Metabolic Panel: Recent Labs  Lab 05/22/24 1301  NA 137  K 4.2  CL 106  CO2 21*  GLUCOSE 234*  BUN 36*  CREATININE 0.89  CALCIUM  9.1   GFR: Estimated Creatinine Clearance: 45.9 mL/min (by C-G formula based on SCr of 0.89 mg/dL). Liver Function Tests: No results for input(s):  AST, ALT, ALKPHOS, BILITOT, PROT, ALBUMIN in the last 168 hours. No results for input(s): LIPASE, AMYLASE in the last 168 hours. No results for input(s): AMMONIA in the last 168 hours. Coagulation Profile: No results for input(s): INR, PROTIME in the last 168 hours. Cardiac Enzymes: No results for input(s): CKTOTAL, CKMB, CKMBINDEX, TROPONINI in the last 168 hours. BNP (last 3 results) Recent Labs    03/06/24 0934  PROBNP 70.0   HbA1C: No results for input(s): HGBA1C in the last 72 hours. CBG: Recent Labs  Lab 05/22/24 2035 05/23/24 0750 05/23/24 1143  GLUCAP 199* 228* 177*   Lipid Profile: No results for input(s):  CHOL, HDL, LDLCALC, TRIG, CHOLHDL, LDLDIRECT in the last 72 hours. Thyroid  Function Tests: No results for input(s): TSH, T4TOTAL, FREET4, T3FREE, THYROIDAB in the last 72 hours. Anemia Panel: No results for input(s): VITAMINB12, FOLATE, FERRITIN, TIBC, IRON, RETICCTPCT in the last 72 hours. Sepsis Labs: Recent Labs  Lab 05/22/24 1553 05/22/24 1641  LATICACIDVEN 2.1* 2.0*    Recent Results (from the past 240 hours)  Culture, blood (routine x 2)     Status: None (Preliminary result)   Collection Time: 05/22/24 12:50 PM   Specimen: BLOOD  Result Value Ref Range Status   Specimen Description   Final    BLOOD LEFT ANTECUBITAL Performed at [facility], [address], [place], [place] [address]    Special Requests   Final    BOTTLES DRAWN AEROBIC AND ANAEROBIC Blood Culture results may not be optimal due to an inadequate volume of blood received in culture bottles Performed at [facility], [address] [place], [place] [address]    Culture   Final    NO GROWTH < 24 HOURS Performed at [facility], [address] [place], [place] [address]    Report Status PENDING  Incomplete  Culture, blood (routine x 2)     Status: None (Preliminary result)   Collection Time: 05/22/24  1:27 PM   Specimen: BLOOD  Result Value Ref Range Status   Specimen Description   Final    BLOOD BLOOD LEFT FOREARM Performed at [facility], [address], [place], [place] [address]    Special Requests   Final    BOTTLES DRAWN AEROBIC AND ANAEROBIC Blood Culture results may not be optimal due to an inadequate volume of blood received in culture bottles Performed at [facility], [address], [place], [place] [address]    Culture   Final    NO GROWTH < 24 HOURS Performed at [facility], [address], [place], [place] [address]    Report Status PENDING  Incomplete   Respiratory (~20 pathogens) panel by PCR     Status: None   Collection Time: 05/22/24  4:12 PM   Specimen: Nasopharyngeal Swab; Respiratory  Result Value Ref Range Status   Adenovirus NOT DETECTED NOT DETECTED Final   Coronavirus 229E NOT DETECTED NOT DETECTED Final    Comment: (NOTE) The Coronavirus on the Respiratory Panel, DOES NOT test for the novel  Coronavirus (2019 nCoV)    Coronavirus HKU1 NOT DETECTED NOT DETECTED Final   Coronavirus NL63 NOT DETECTED NOT DETECTED Final   Coronavirus OC43 NOT DETECTED NOT DETECTED Final   Metapneumovirus NOT DETECTED NOT DETECTED Final   Rhinovirus / Enterovirus NOT DETECTED NOT DETECTED Final   Influenza A NOT DETECTED NOT DETECTED Final   Influenza B NOT DETECTED NOT DETECTED Final  Parainfluenza Virus 1 NOT DETECTED NOT DETECTED Final   Parainfluenza Virus 2 NOT DETECTED NOT DETECTED Final   Parainfluenza Virus 3 NOT DETECTED NOT DETECTED Final   Parainfluenza Virus 4 NOT DETECTED NOT DETECTED Final   Respiratory Syncytial Virus NOT DETECTED NOT DETECTED Final   Bordetella pertussis NOT DETECTED NOT DETECTED Final   Bordetella Parapertussis NOT DETECTED NOT DETECTED Final   Chlamydophila pneumoniae NOT DETECTED NOT DETECTED Final   Mycoplasma pneumoniae NOT DETECTED NOT DETECTED Final    Comment: Performed at [facility], [address] [place], [place] [address]         Radiology Studies: CT Angio Chest PE W/Cm &/Or Wo Cm Result Date: 05/22/2024 CLINICAL DATA:  Positive D-dimer, hypoxia, labored breathing, history of interstitial lung disease EXAM: CT ANGIOGRAPHY CHEST WITH CONTRAST TECHNIQUE: Multidetector CT imaging of the chest was performed using the standard protocol during bolus administration of intravenous contrast. Multiplanar CT image reconstructions and MIPs were obtained to evaluate the vascular anatomy. RADIATION DOSE REDUCTION: This exam was performed according to the departmental dose-optimization program  which includes automated exposure control, adjustment of the mA and/or kV according to patient size and/or use of iterative reconstruction technique. CONTRAST:  75mL OMNIPAQUE  IOHEXOL  350 MG/ML SOLN COMPARISON:  03/14/2024 FINDINGS: Cardiovascular: This is a technically adequate evaluation of the pulmonary vasculature. There is a subsegmental right lower lobe pulmonary embolus, reference image 78/4. Nonocclusive filling defect within the left upper lobe pulmonary artery reference image 54/4 consistent with age indeterminate pulmonary embolus. Clot burden is minimal. No right heart strain. The heart is unremarkable without pericardial effusion. Normal caliber of the thoracic aorta. Atherosclerosis of the aorta and coronary vasculature. Mediastinum/Nodes: Stable borderline enlarged mediastinal lymph nodes, likely reactive. No pathologic adenopathy. Thyroid , trachea, and esophagus are stable. Lungs/Pleura: Stable areas of bilobed bilateral pulmonary fibrosis and scarring, with minimal traction bronchiectasis unchanged. Stable areas of areas of ground-glass opacity most pronounced in the upper lobes consistent with pneumonitis. No dense consolidation, effusion, or pneumothorax. Central airways are patent. Upper Abdomen: No acute abnormality. Musculoskeletal: No acute or destructive bony abnormalities. Reconstructed images demonstrate no additional findings. Review of the MIP images confirms the above findings. IMPRESSION: 1. Small bilateral pulmonary emboli within the right lower and left upper lobes. Minimal clot burden, with no evidence of right heart strain. 2. Stable findings of chronic pulmonary fibrosis, without appreciable change in the scattered ground-glass opacity seen previously consistent with pneumonitis. 3. Aortic Atherosclerosis (ICD10-I70.0). Coronary artery atherosclerosis. Critical Value/emergent results were called by telephone at the time of interpretation on 05/22/2024 at 4:47 pm to provider ANDREW  TEE, who verbally acknowledged these results. Electronically Signed   By: Ozell Daring M.D.   On: 05/22/2024 16:51   DG Chest 2 View Result Date: 05/22/2024 CLINICAL DATA:  Shortness of breath.  Hypoxia. EXAM: CHEST - 2 VIEW COMPARISON:  03/14/2024 FINDINGS: Normal-sized heart. Stable extensive chronic interstitial prominence. Stable right middle lobe linear scarring. Unremarkable bones. IMPRESSION: 1. No acute abnormality. 2. Stable extensive chronic interstitial lung disease. Electronically Signed   By: Elspeth Bathe M.D.   On: 05/22/2024 13:21        Scheduled Meds:  albuterol   2.5 mg Nebulization Q6H   arformoterol  15 mcg Nebulization BID   budesonide   0.5 mg Nebulization BID   diltiazem   120 mg Oral Daily   insulin  aspart  0-24 Units Subcutaneous TID WC   insulin  glargine-yfgn  15 Units Subcutaneous Daily   methylPREDNISolone  (SOLU-MEDROL ) injection  125 mg Intravenous BID   metoprolol  succinate  25 mg Oral Daily   mycophenolate   500 mg Oral BID   pravastatin   40 mg Oral QPM   revefenacin  175 mcg Nebulization Daily   [START ON 05/24/2024] sulfamethoxazole -trimethoprim   1 tablet Oral Once per day on Monday Wednesday Friday   Continuous Infusions:  azithromycin  500 mg (05/23/24 1156)   cefTRIAXone (ROCEPHIN)  IV 2 g (05/23/24 1106)     LOS: 1 day    Time spent: 45 minutes spent on chart review, discussion with nursing staff, consultants, updating family and interview/physical exam; more than 50% of that time was spent in counseling and/or coordination of care.    Harlene RAYMOND Bowl, DO [facility] Available via Epic secure chat 7am-7pm After these hours, please refer to coverage provider listed on [email or web address] 05/23/2024, 12:34 PM

## 2024-05-23 NOTE — Op Note (Signed)
 [facility] Patient Name: Rhonda [name] Procedure Date: 05/23/2024 MRN: [id number] Attending MD: Lonna Coder , MD, [id number] Date of Birth: [birth year] CSN: [id number] Age: 73 Admit Type: Outpatient Ethnicity: Not Hispanic or Latino Procedure:             Bronchoscopy Indications:           Interstitial lung disease, Acute on chronic                         respiratory failure Providers:             Lonna Coder, MD, Jacquelyn Jaci Pierce, RN,                         Haskel Chris, Technician Referring MD:           Medicines:             General Anesthesia Complications:         No immediate complications Estimated Blood Loss:  Estimated blood loss: none. Procedure:      Pre-Anesthesia Assessment:      - A History and Physical has been performed. Patient meds and allergies       have been reviewed. The risks and benefits of the procedure and the       sedation options and risks were discussed with the patient. All       questions were answered and informed consent was obtained. Patient       identification and proposed procedure were verified prior to the       procedure by the physician in the pre-procedure area. Mental Status       Examination: alert and oriented. Airway Examination: normal       oropharyngeal airway. Respiratory Examination: clear to auscultation. CV       Examination: RRR, no murmurs, no S3 or S4. ASA Grade Assessment: II - A       patient with mild systemic disease. After reviewing the risks and       benefits, the patient was deemed in satisfactory condition to undergo       the procedure. The anesthesia plan was to use general anesthesia.       Immediately prior to administration of medications, the patient was       re-assessed for adequacy to receive sedatives. The heart rate,       respiratory rate, oxygen  saturations, blood pressure, adequacy of       pulmonary ventilation, and response to care were monitored  throughout       the procedure. The physical status of the patient was re-assessed after       the procedure.      After obtaining informed consent, the bronchoscope was passed under       direct vision. Throughout the procedure, the patient's blood pressure,       pulse, and oxygen  saturations were monitored continuously. the BF-[id number]       ([id number]) Olympus bronchoscope was introduced through the mouth, via the       endotracheal tube and advanced to the tracheobronchial tree of both       lungs. Findings:      The endotracheal tube is in good position. The visualized portion of the       trachea is of normal caliber. The carina is sharp. The tracheobronchial       tree was examined  to at least the first subsegmental level. Bronchial       mucosa and anatomy are normal; there are no endobronchial lesions, and       no secretions.      The bronchoscope was advanced until wedged at the desired location for       bronchoalveolar lavage. BAL was performed in the RUL anterior segment       (B3) and in the LUL anterior segment (B3) of the lung and sent for cell       count, bacterial culture, viral smears & culture, and fungal & AFB       analysis and cytology. 120 mL of fluid were instilled. 80 mL were       returned. The return was cloudy. There were no mucoid plugs in the       return fluid. Multiple specimens were obtained and pooled into one       specimen, which was sent for analysis. Impression:      - Interstitial lung disease      - Acute on chronic respiratory failure      - The airway examination was normal.      - Bronchoalveolar lavage was performed. Moderate Sedation:      GA Recommendation:      - Await BAL results. Procedure Code(s):      --- Professional ---      [phone], Bronchoscopy, rigid or flexible, including fluoroscopic guidance,       when performed; with bronchial alveolar lavage Diagnosis Code(s):      --- Professional ---      J84.9, Interstitial pulmonary  disease, unspecified      J96.20, Acute and chronic respiratory failure, unspecified whether with       hypoxia or hypercapnia CPT copyright 2022 [employer]. All rights reserved. The codes documented in this report are preliminary and upon coder review may  be revised to meet current compliance requirements. Nikole Swartzentruber, MD Edvardo Honse, MD 05/23/2024 2:24:56 PM Number of Addenda: 0 Scope In: Scope Out:

## 2024-05-23 NOTE — Progress Notes (Signed)
 PHARMACY - ANTICOAGULATION CONSULT NOTE  Pharmacy Consult for heparin  Indication: pulmonary embolus  Allergies  Allergen Reactions   Ozempic (0.25 Or 0.5 Mg-Dose) [Semaglutide(0.25 Or 0.5mg -Dos)] Other (See Comments)    Paralyzed the stomach   Tape Other (See Comments)    SKIN IS VERY FRAGILE!! Burns and pulls up the skin!! SKIN BRUISES and TEARS EASILY.SABRA   Betadine [Povidone Iodine] Other (See Comments)    Blisters    Codeine Nausea And Vomiting   Garlic Diarrhea   Hydrocodone  Nausea And Vomiting   Iodine Other (See Comments)    Blisters    Macrobid [Nitrofurantoin] Other (See Comments)    Instructed by dr not to take    Ofev  [Nintedanib] Other (See Comments)    Abdominal pain   Onion Diarrhea   Shellfish Allergy Nausea And Vomiting   Statins Other (See Comments)    Leg pain, but Pravachol  is tolerated   Sulfa  Antibiotics Other (See Comments)    Headaches     Patient Measurements: Height: 4' 11 (149.9 cm) Weight: 68.2 kg (150 lb 5.7 oz) IBW/kg (Calculated) : 43.2 HEPARIN  DW (KG): 58.3 Heparin  dosing weight 58 kg  Vital Signs: Temp: 98 F (36.7 C) (07/01 0856) Temp Source: Oral (07/01 0856) BP: 134/73 (07/01 0937) Pulse Rate: 72 (07/01 0937)  Labs: Recent Labs    05/22/24 1301 05/22/24 1522 05/23/24 0542 05/23/24 1053  HGB 13.7  --  13.6  --   HCT 44.8  --  44.2  --   PLT 238  --  199  --   HEPARINUNFRC  --   --   --  >1.10*  CREATININE 0.89  --   --   --   TROPONINIHS 5 4  --   --     Estimated Creatinine Clearance: 45.9 mL/min (by C-G formula based on SCr of 0.89 mg/dL).   Medical History: Past Medical History:  Diagnosis Date   Allergy    Arthritis    history spinal stenosis. osteoarthritis right hip   Asthma    Cataract    Coronary artery calcification seen on CAT scan 08/19/2017   >300 on CT scan 08/2017   DDD (degenerative disc disease), thoracic    Depression    DOE (dyspnea on exertion)    a. 04/2010 Lexi MV EF 71%, no  ischemia/infarct;     Fatty liver    GERD (gastroesophageal reflux disease)    H/O steroid therapy    Steroid use orally over 4 yrs- for Lung Fibrosis   Heart palpitations 02/28/2015   Helicobacter pylori ab+    Hemorrhoids    Hiatal hernia    High cholesterol    History of chronic bronchitis    as child   History of migraine    History of MRSA infection    Hyperlipidemia, mixed 08/19/2017   Hyperplastic colon polyp 2007   IBS (irritable bowel syndrome)    Inguinal hernia    right   Insulin  resistance    past   Interstitial lung disease (HCC)    MVP (mitral valve prolapse)    Posterior mitral valve leaflet with mild MR   Pneumonia    Pneumonitis, hypersensitivity (HCC)    a. 09/2012 s/p Bx - ? 2/2 bird, mold, oil paint exposure ->on steroids, followed by pulm.   PONV (postoperative nausea and vomiting)    Pre-diabetes    takes Metformin    Pulmonary fibrosis (HCC)    Dr. Geronimo follows- stable at present   PVC (premature ventricular  contraction) 08/19/2017   Rapid heart rate    Dr. Shlomo follows- last visit Epic note 9'16   Squamous cell carcinoma of skin    Tinnitus, right ear    Vocal cord ulcer     Medications: No anticoagulants PTA  Assessment: Pt is a 47 yoF presenting with SOB/respiratory distress. PMH significant for chronic hypersensitivity pneumonitis related to ILD. CTA revealed small bilateral PE - minimal clot burden with no evidence of RHS. Pharmacy consulted to dose heparin .   TBW 66 kg on admission, enoxaparin  50 mg subQ dose given 6/30 @17 :18   Today, 05/23/24 Heparin  level >1.10 -- supratherapeutic on heparin  950 units/hr Confirmed with RN that heparin  is infusing on the patient's L arm and labs were drawn from the R arm. Patient going for a bronchoscopy later today and RN was instructed by PCCM to pause heparin  at noon. Hgb 13.6, plts 199--stable  Goal of Therapy:  Heparin  level 0.3-0.7 units/ml Monitor platelets by anticoagulation protocol:  Yes   Plan:  Stop heparin  now  Follow-up heparin  restart at reduced rate post-bronchoscopy  CBC, heparin  level daily Monitor for signs of bleeding   Lacinda Moats, PharmD Clinical Pharmacist  7/1/202511:46 AM

## 2024-05-23 NOTE — Progress Notes (Signed)
 Heparin  drip stopped as per pharmacy and for Bronchoscopy. Pharmacy will advise when to restart heparin  drip.

## 2024-05-23 NOTE — Plan of Care (Signed)

## 2024-05-23 NOTE — Anesthesia Preprocedure Evaluation (Addendum)
 Anesthesia Evaluation  Patient identified by MRN, date of birth, ID band Patient awake    Reviewed: Allergy & Precautions, NPO status , Patient's Chart, lab work & pertinent test results, reviewed documented beta blocker date and time   History of Anesthesia Complications (+) PONV and history of anesthetic complications  Airway Mallampati: III  TM Distance: >3 FB Neck ROM: Full    Dental no notable dental hx. (+) Teeth Intact, Dental Advisory Given   Pulmonary asthma , PE (on heparin  gtt) ILD, on home oxygen  4L   Pulmonary exam normal breath sounds clear to auscultation       Cardiovascular + CAD and + DOE  Normal cardiovascular exam Rhythm:Regular Rate:Normal  TTE 2024 1. Left ventricular ejection fraction, by estimation, is 65 to 70%. The  left ventricle has normal function. The left ventricle has no regional  wall motion abnormalities. Left ventricular diastolic parameters are  indeterminate.   2. Right ventricular systolic function is normal. The right ventricular  size is mildly enlarged. There is mildly elevated pulmonary artery  systolic pressure. The estimated right ventricular systolic pressure is  36.6 mmHg.   3. Left atrial size was mild to moderately dilated.   4. Right atrial size was mildly dilated.   5. The mitral valve is normal in structure. Trivial mitral valve  regurgitation. No evidence of mitral stenosis.   6. The aortic valve is tricuspid. There is mild calcification of the  aortic valve. Aortic valve regurgitation is mild. No aortic stenosis is  present.   7. The inferior vena cava is normal in size with greater than 50%  respiratory variability, suggesting right atrial pressure of 3 mmHg.   Cath 2025 1. Normal right and left heart filling pressures. 2. No pulmonary hypertension (mean PA pressure 19).  3. There is a mild step-up in oxygen  saturation from SVC to PA.  I do not think that a significant  R => L shunt is present with normal filling pressures.  However, could consider a bubble study with echo in future.      Neuro/Psych  PSYCHIATRIC DISORDERS  Depression    negative neurological ROS     GI/Hepatic Neg liver ROS, hiatal hernia,GERD  ,,  Endo/Other  diabetes, Type 2, Insulin  Dependent    Renal/GU negative Renal ROS  negative genitourinary   Musculoskeletal  (+) Arthritis ,    Abdominal   Peds  Hematology negative hematology ROS (+)   Anesthesia Other Findings 73 y.o. female with medical history significant of interstitial lung disease, CAD, GERD, hyperlipidemia, hypertension who presented to the emergency department due to shortness of breath.  Patient has history of interstitial lung disease and follows with pulmonology. CTA pulmonary embolism study demonstrated small bilateral PEs.  Patient was started on heparin  drip and admitted for further workup.  Plan for bronchoscopy on 7/1.  Reproductive/Obstetrics                              Anesthesia Physical Anesthesia Plan  ASA: 3  Anesthesia Plan: General   Post-op Pain Management: Minimal or no pain anticipated   Induction: Intravenous  PONV Risk Score and Plan: 4 or greater and Treatment may vary due to age or medical condition  Airway Management Planned: Oral ETT  Additional Equipment:   Intra-op Plan:   Post-operative Plan: Extubation in OR  Informed Consent: I have reviewed the patients History and Physical, chart, labs and discussed the procedure including  the risks, benefits and alternatives for the proposed anesthesia with the patient or authorized representative who has indicated his/her understanding and acceptance.     Dental advisory given  Plan Discussed with: CRNA  Anesthesia Plan Comments:         Anesthesia Quick Evaluation

## 2024-05-23 NOTE — Transfer of Care (Signed)
 Immediate Anesthesia Transfer of Care Note  Patient: Rhonda [name]  Procedure(s) Performed: VIDEO BRONCHOSCOPY WITHOUT FLUORO (Bilateral)  Patient Location: PACU  Anesthesia Type:General  Level of Consciousness: drowsy  Airway & Oxygen  Therapy: Patient Spontanous Breathing  Post-op Assessment: Report given to RN  Post vital signs: Reviewed and stable  Last Vitals:  Vitals Value Taken Time  BP    Temp    Pulse 89 05/23/24 14:43  Resp 20 05/23/24 14:43  SpO2 92 % 05/23/24 14:43  Vitals shown include unfiled device data.  Last Pain:  Vitals:   05/23/24 1309  TempSrc: Temporal  PainSc: 0-No pain         Complications: No notable events documented.

## 2024-05-24 ENCOUNTER — Telehealth (HOSPITAL_COMMUNITY): Payer: Self-pay | Admitting: Pharmacy Technician

## 2024-05-24 ENCOUNTER — Inpatient Hospital Stay (HOSPITAL_COMMUNITY)

## 2024-05-24 ENCOUNTER — Other Ambulatory Visit (HOSPITAL_COMMUNITY): Payer: Self-pay

## 2024-05-24 ENCOUNTER — Encounter (HOSPITAL_COMMUNITY)

## 2024-05-24 DIAGNOSIS — Z794 Long term (current) use of insulin: Secondary | ICD-10-CM | POA: Diagnosis not present

## 2024-05-24 DIAGNOSIS — E119 Type 2 diabetes mellitus without complications: Secondary | ICD-10-CM | POA: Diagnosis not present

## 2024-05-24 DIAGNOSIS — J9601 Acute respiratory failure with hypoxia: Secondary | ICD-10-CM | POA: Diagnosis not present

## 2024-05-24 DIAGNOSIS — J9621 Acute and chronic respiratory failure with hypoxia: Secondary | ICD-10-CM | POA: Diagnosis not present

## 2024-05-24 DIAGNOSIS — Z86711 Personal history of pulmonary embolism: Secondary | ICD-10-CM | POA: Diagnosis not present

## 2024-05-24 DIAGNOSIS — I2699 Other pulmonary embolism without acute cor pulmonale: Secondary | ICD-10-CM | POA: Diagnosis not present

## 2024-05-24 DIAGNOSIS — J679 Hypersensitivity pneumonitis due to unspecified organic dust: Secondary | ICD-10-CM | POA: Diagnosis not present

## 2024-05-24 LAB — BASIC METABOLIC PANEL WITH GFR
Anion gap: 9 (ref 5–15)
BUN: 26 mg/dL — ABNORMAL HIGH (ref 8–23)
CO2: 24 mmol/L (ref 22–32)
Calcium: 8.5 mg/dL — ABNORMAL LOW (ref 8.9–10.3)
Chloride: 106 mmol/L (ref 98–111)
Creatinine, Ser: 1.08 mg/dL — ABNORMAL HIGH (ref 0.44–1.00)
GFR, Estimated: 54 mL/min — ABNORMAL LOW (ref >60)
Glucose, Bld: 164 mg/dL — ABNORMAL HIGH (ref 70–99)
Potassium: 4 mmol/L (ref 3.5–5.1)
Sodium: 139 mmol/L (ref 135–145)

## 2024-05-24 LAB — CBC
HCT: 40.6 % (ref 36.0–46.0)
Hemoglobin: 12.5 g/dL (ref 12.0–15.0)
MCH: 27.8 pg (ref 26.0–34.0)
MCHC: 30.8 g/dL (ref 30.0–36.0)
MCV: 90.2 fL (ref 80.0–100.0)
Platelets: 182 10*3/uL (ref 150–400)
RBC: 4.5 MIL/uL (ref 3.87–5.11)
RDW: 18.7 % — ABNORMAL HIGH (ref 11.5–15.5)
WBC: 20.3 10*3/uL — ABNORMAL HIGH (ref 4.0–10.5)
nRBC: 0 % (ref 0.0–0.2)

## 2024-05-24 LAB — PNEUMOCYSTIS JIROVECI SMEAR BY DFA: Pneumocystis jiroveci Ag: NEGATIVE

## 2024-05-24 LAB — GLUCOSE, CAPILLARY
Glucose-Capillary: 284 mg/dL — ABNORMAL HIGH (ref 70–99)
Glucose-Capillary: 285 mg/dL — ABNORMAL HIGH (ref 70–99)
Glucose-Capillary: 309 mg/dL — ABNORMAL HIGH (ref 70–99)
Glucose-Capillary: 140 mg/dL — ABNORMAL HIGH (ref 70–99)
Glucose-Capillary: 238 mg/dL — ABNORMAL HIGH (ref 70–99)

## 2024-05-24 LAB — HEPARIN LEVEL (UNFRACTIONATED)
Heparin Unfractionated: 0.72 [IU]/mL — ABNORMAL HIGH (ref 0.30–0.70)
Heparin Unfractionated: 0.73 [IU]/mL — ABNORMAL HIGH (ref 0.30–0.70)

## 2024-05-24 MED ORDER — APIXABAN 5 MG PO TABS: 5.0000 mg | ORAL_TABLET | Freq: Two times a day (BID) | ORAL | Status: DC

## 2024-05-24 MED ORDER — ALBUTEROL SULFATE (2.5 MG/3ML) 0.083% IN NEBU
2.5000 mg | INHALATION_SOLUTION | Freq: Four times a day (QID) | RESPIRATORY_TRACT | Status: DC
  Administered 2024-05-25 (×2): 2.5 mg via RESPIRATORY_TRACT
  Filled 2024-05-24 (×2): qty 3

## 2024-05-24 MED ORDER — IOHEXOL 9 MG/ML PO SOLN
1000.0000 mL | ORAL | Status: AC
  Administered 2024-05-24: 1000 mL via ORAL

## 2024-05-24 MED ORDER — IOHEXOL 300 MG/ML  SOLN
100.0000 mL | Freq: Once | INTRAMUSCULAR | Status: AC | PRN
  Administered 2024-05-24: 100 mL via INTRAVENOUS

## 2024-05-24 MED ORDER — AZITHROMYCIN 250 MG PO TABS
500.0000 mg | ORAL_TABLET | Freq: Every day | ORAL | Status: DC
  Administered 2024-05-25 – 2024-05-27 (×3): 500 mg via ORAL
  Filled 2024-05-24 (×3): qty 2

## 2024-05-24 MED ORDER — APIXABAN 5 MG PO TABS
10.0000 mg | ORAL_TABLET | Freq: Two times a day (BID) | ORAL | Status: DC
  Administered 2024-05-24 – 2024-05-26 (×5): 10 mg via ORAL
  Filled 2024-05-24 (×5): qty 2

## 2024-05-24 NOTE — Progress Notes (Signed)
 PHARMACY - ANTICOAGULATION CONSULT NOTE  Pharmacy Consult for heparin  Indication: pulmonary embolus  Allergies  Allergen Reactions   Ozempic (0.25 Or 0.5 Mg-Dose) [Semaglutide(0.25 Or 0.5mg -Dos)] Other (See Comments)    Paralyzed the stomach   Tape Other (See Comments)    SKIN IS VERY FRAGILE!! Burns and pulls up the skin!! SKIN BRUISES and TEARS EASILY.SABRA   Betadine [Povidone Iodine] Other (See Comments)    Blisters    Codeine Nausea And Vomiting   Garlic Diarrhea   Hydrocodone  Nausea And Vomiting   Iodine Other (See Comments)    Blisters    Macrobid [Nitrofurantoin] Other (See Comments)    Instructed by dr not to take    Ofev  [Nintedanib] Other (See Comments)    Abdominal pain   Onion Diarrhea   Shellfish Allergy Nausea And Vomiting   Statins Other (See Comments)    Leg pain, but Pravachol  is tolerated   Sulfa  Antibiotics Other (See Comments)    Headaches     Patient Measurements: Height: 4' 11 (149.9 cm) Weight: 67.1 kg (148 lb) IBW/kg (Calculated) : 43.2 HEPARIN  DW (KG): 57.9  Vital Signs: Temp: 98 F (36.7 C) (07/02 0500) Temp Source: Oral (07/02 0500) BP: 125/78 (07/02 0500) Pulse Rate: 85 (07/02 0500)  Labs: Recent Labs    05/22/24 1301 05/22/24 1522 05/23/24 0542 05/23/24 1053 05/24/24 0028 05/24/24 1035  HGB 13.7  --  13.6  --  12.5  --   HCT 44.8  --  44.2  --  40.6  --   PLT 238  --  199  --  182  --   HEPARINUNFRC  --   --   --  >1.10* 0.72* 0.73*  CREATININE 0.89  --   --   --  1.08*  --   TROPONINIHS 5 4  --   --   --   --     Estimated Creatinine Clearance: 37.5 mL/min (A) (by C-G formula based on SCr of 1.08 mg/dL (H)).   Medical History: Past Medical History:  Diagnosis Date   Allergy    Arthritis    history spinal stenosis. osteoarthritis right hip   Asthma    Cataract    Coronary artery calcification seen on CAT scan 08/19/2017   >300 on CT scan 08/2017   DDD (degenerative disc disease), thoracic    Depression     DOE (dyspnea on exertion)    a. 04/2010 Lexi MV EF 71%, no ischemia/infarct;     Fatty liver    GERD (gastroesophageal reflux disease)    H/O steroid therapy    Steroid use orally over 4 yrs- for Lung Fibrosis   Heart palpitations 02/28/2015   Helicobacter pylori ab+    Hemorrhoids    Hiatal hernia    High cholesterol    History of chronic bronchitis    as child   History of migraine    History of MRSA infection    Hyperlipidemia, mixed 08/19/2017   Hyperplastic colon polyp 2007   IBS (irritable bowel syndrome)    Inguinal hernia    right   Insulin  resistance    past   Interstitial lung disease (HCC)    MVP (mitral valve prolapse)    Posterior mitral valve leaflet with mild MR   Pneumonia    Pneumonitis, hypersensitivity (HCC)    a. 09/2012 s/p Bx - ? 2/2 bird, mold, oil paint exposure ->on steroids, followed by pulm.   PONV (postoperative nausea and vomiting)    Pre-diabetes  takes Metformin    Pulmonary fibrosis (HCC)    Dr. Geronimo follows- stable at present   PVC (premature ventricular contraction) 08/19/2017   Rapid heart rate    Dr. Shlomo follows- last visit Epic note 9'16   Squamous cell carcinoma of skin    Tinnitus, right ear    Vocal cord ulcer     Medications: No anticoagulants PTA  Assessment: Pt is a 42 yoF presenting with SOB/respiratory distress. PMH significant for chronic hypersensitivity pneumonitis related to ILD. CTA revealed small bilateral PE - minimal clot burden with no evidence of RHS. Pharmacy consulted to dose heparin .   Today, 05/24/24 Heparin  level 0.73--remains supra-therapeutic on heparin  infusion of 750 units/hr Confirmed with RN that lab was drawn from opposite arm as heparin  infusion. After discussion with MD, okay to transition to DOAC given multiple supratherapeutic heparin  levels despite infusion rate decreases. Spoke with patient who would prefer Eliquis therapy over Xarelto given lower risk for GI bleed compared to other  DOACs. All patient questions were answered regarding Eliquis administration and drug interactions (no significant interactions noted). CBC: Hg/pltc WNL No bleeding or infusion related concerns reported  Goal of Therapy:  Heparin  level 0.3-0.7 units/ml Monitor platelets by anticoagulation protocol: Yes   Plan:  Stop heparin  now Start Eliquis 10mg  BID x7 days, followed by 5mg  BID thereafter (Eliquis copay is $40/month) Monitor for s/sx of bleeding daily Eliquis education provided to patient 7/2 afternoon by pharmacy    Lacinda Moats, PharmD Clinical Pharmacist  7/2/202512:32 PM

## 2024-05-24 NOTE — Plan of Care (Signed)

## 2024-05-24 NOTE — Progress Notes (Signed)
 PHARMACY - ANTICOAGULATION CONSULT NOTE  Pharmacy Consult for heparin  Indication: pulmonary embolus  Allergies  Allergen Reactions   Ozempic (0.25 Or 0.5 Mg-Dose) [Semaglutide(0.25 Or 0.5mg -Dos)] Other (See Comments)    Paralyzed the stomach   Tape Other (See Comments)    SKIN IS VERY FRAGILE!! Burns and pulls up the skin!! SKIN BRUISES and TEARS EASILY.SABRA   Betadine [Povidone Iodine] Other (See Comments)    Blisters    Codeine Nausea And Vomiting   Garlic Diarrhea   Hydrocodone  Nausea And Vomiting   Iodine Other (See Comments)    Blisters    Macrobid [Nitrofurantoin] Other (See Comments)    Instructed by dr not to take    Ofev  [Nintedanib] Other (See Comments)    Abdominal pain   Onion Diarrhea   Shellfish Allergy Nausea And Vomiting   Statins Other (See Comments)    Leg pain, but Pravachol  is tolerated   Sulfa  Antibiotics Other (See Comments)    Headaches     Patient Measurements: Height: 4' 11 (149.9 cm) Weight: 67.1 kg (148 lb) IBW/kg (Calculated) : 43.2 HEPARIN  DW (KG): 57.9  Vital Signs: Temp: 97.3 F (36.3 C) (07/01 1938) Temp Source: Temporal (07/01 1446) BP: 126/72 (07/01 1938) Pulse Rate: 80 (07/01 1938)  Labs: Recent Labs    05/22/24 1301 05/22/24 1522 05/23/24 0542 05/23/24 1053 05/24/24 0028  HGB 13.7  --  13.6  --  12.5  HCT 44.8  --  44.2  --  40.6  PLT 238  --  199  --  182  HEPARINUNFRC  --   --   --  >1.10* 0.72*  CREATININE 0.89  --   --   --  1.08*  TROPONINIHS 5 4  --   --   --     Estimated Creatinine Clearance: 37.5 mL/min (A) (by C-G formula based on SCr of 1.08 mg/dL (H)).   Medical History: Past Medical History:  Diagnosis Date   Allergy    Arthritis    history spinal stenosis. osteoarthritis right hip   Asthma    Cataract    Coronary artery calcification seen on CAT scan 08/19/2017   >300 on CT scan 08/2017   DDD (degenerative disc disease), thoracic    Depression    DOE (dyspnea on exertion)    a. 04/2010  Lexi MV EF 71%, no ischemia/infarct;     Fatty liver    GERD (gastroesophageal reflux disease)    H/O steroid therapy    Steroid use orally over 4 yrs- for Lung Fibrosis   Heart palpitations 02/28/2015   Helicobacter pylori ab+    Hemorrhoids    Hiatal hernia    High cholesterol    History of chronic bronchitis    as child   History of migraine    History of MRSA infection    Hyperlipidemia, mixed 08/19/2017   Hyperplastic colon polyp 2007   IBS (irritable bowel syndrome)    Inguinal hernia    right   Insulin  resistance    past   Interstitial lung disease (HCC)    MVP (mitral valve prolapse)    Posterior mitral valve leaflet with mild MR   Pneumonia    Pneumonitis, hypersensitivity (HCC)    a. 09/2012 s/p Bx - ? 2/2 bird, mold, oil paint exposure ->on steroids, followed by pulm.   PONV (postoperative nausea and vomiting)    Pre-diabetes    takes Metformin    Pulmonary fibrosis (HCC)    Dr. Geronimo follows- stable at present  PVC (premature ventricular contraction) 08/19/2017   Rapid heart rate    Dr. Shlomo follows- last visit Epic note 9'16   Squamous cell carcinoma of skin    Tinnitus, right ear    Vocal cord ulcer     Medications: No anticoagulants PTA  Assessment: Pt is a 82 yoF presenting with SOB/respiratory distress. PMH significant for chronic hypersensitivity pneumonitis related to ILD. CTA revealed small bilateral PE - minimal clot burden with no evidence of RHS. Pharmacy consulted to dose heparin .   Today, 05/24/24 Heparin  level 0.72- supra-therapeutic on heparin  infusion of 750 units/hr Confirmed that lab was drawn from opposite arm as heparin  infusion CBC: Hg/pltc WNL No bleeding or infusion related concerns reported by RN  Goal of Therapy:  Heparin  level 0.3-0.7 units/ml Monitor platelets by anticoagulation protocol: Yes   Plan:  Decrease heparin  to 650 units/hr Check 8 hour heparin  level after rate change CBC, heparin  level daily Monitor for  signs of bleeding  Rosaline Millet, PharmD 05/24/2024,1:15 AM

## 2024-05-24 NOTE — Progress Notes (Signed)
 NAME:  Rhonda [name], MRN:  [id number], DOB:  Aug 10, 1951, LOS: 2 ADMISSION DATE:  05/22/2024, CONSULTATION DATE:  05/22/2024 REFERRING MD:  Dr. Lenor-  EDP, CHIEF COMPLAINT:  Acute on chronic hypoxic respiratory failure    History of Present Illness:  Rhonda [name] is a 73 y.o. female with PMH significant for  hypersensitive pneumonitis related pulmonary fibrosis/interstitial lung disease immunosuppressed at baseline with chronic steroids, CAD, HTN, HLD, and steroids induce diabetes who presented to the ED at the direction of primary pulmonologist for complaints of hypoxic and change in sputum production.   Of note patient had had a progressive decline in respiratory status over that last year, with worst decline seen after suffering a rhinovirus infection in April. After this admission she was started on Prednisone  taper and CellCept , she tolerated this well until her steroids got to 10 of Prednisone  daily. At this time she began to desaturate quickly. He primary pulmonologist (Dr. Geronimo) wished to admit for further management but patient refused and decision was made to increase steroids back to 60mg  daily and symptoms resolved until 6/30 when hypoxic returned and sputum production changed.   Pertinent  Medical History  Hypersensitive pneumonitis related pulmonary fibrosis/interstitial lung disease immunosuppressed at baseline with chronic steroids, CAD, HTN, HLD, and steroids induce diabetes  Significant Hospital Events: Including procedures, antibiotic start and stop dates in addition to other pertinent events   6/30 Presented with more subjective hypoxia with and change in sputum production.  Started on heparin  for segmental PE 7/1 Bronchoscopy with BAL  Interim History / Subjective:   Underwent bronchoscopy yesterday without any complication  Objective    Blood pressure 125/78, pulse 85, temperature 98 F (36.7 C), temperature source Oral, resp. rate 15, height 4' 11 (1.499  m), weight 67.1 kg, SpO2 98%.        Intake/Output Summary (Last 24 hours) at 05/24/2024 0732 Last data filed at 05/23/2024 2355 Gross per 24 hour  Intake 1137.44 ml  Output --  Net 1137.44 ml   Filed Weights   05/22/24 1220 05/23/24 0856 05/23/24 1309  Weight: 66.2 kg 68.2 kg 67.1 kg    Examination: Blood pressure 125/78, pulse 85, temperature 98 F (36.7 C), temperature source Oral, resp. rate 15, height 4' 11 (1.499 m), weight 67.1 kg, SpO2 98%. Gen:      No acute distress HEENT:  EOMI, sclera anicteric Neck:     No masses; no thyromegaly Lungs:    Clear to auscultation bilaterally; normal respiratory effort CV:         Regular rate and rhythm; no murmurs Abd:      + bowel sounds; soft, non-tender; no palpable masses, no distension Ext:    No edema; adequate peripheral perfusion Neuro: alert and oriented x 3 Psych: normal mood and affect   BAL cell count with monocyte, macrophage predominance.  Cultures, cytology are pending  Resolved problem list   Assessment and Plan  Acute on chronic hypoxic respiratory failure Chronic hypersensitivity pneumonitis  Chronic hypersensitivity pneumonitis, with recent exacerbation. Symptoms include decreased oxygen  saturation upon exertion. Previous treatments with doxycycline  and steroids were effective, but recent exacerbations have been resistant to multiple antibiotic and steroid cycles. Differential includes worsening of interstitial lung disease or new infection. Concerns about potential new infection or progression of interstitial lung disease. - Administer IV Solumedrol 125 mg twice daily.  Continue Bactrim  for prophylaxis - Administer IV antibiotics for CAP coverage  - Order PT to work with her.  Get out  of bed and mobilize.  Pulmonary embolism, low clot burden with no evidence of right heart strain Noted on CT angiogram.  Continue IV heparin .  Can change to DOAC today Check lower extremity duplex She is concerned of possibility of  occult malignancy causing pulmonary embolism.  Notes family history of lymphoma. She will make sure that her mammogram is up-to-date.  Unable to get colonoscopy due to respiratory status but has had fecal testing for colon cancer screening Will get CT abdomen pelvis to evaluate for any malignancy   Insulin -dependent diabetes mellitus  Insulin -dependent diabetes mellitus secondary to chronic steroid use. Concerns about blood sugar management with increased steroid dosage. Hospitalization will require frequent monitoring and adjustment of insulin .  - Monitor blood sugar every four hours while hospitalized  - Adjust insulin  as needed    Vision changes  Vision changes characterized by blurriness and difficulty focusing. Ophthalmologist evaluation ruled out diabetic retinopathy. Possible association with fluctuating blood sugar levels and steroid use.  - Monitor vision changes in relation to blood sugar and steroid levels    Best Practice (right click and Reselect all SmartList Selections daily)   Per primary   Signature:   Caydence Enck MD [facility] See Amion for pager  If no response to pager , please call [phone] until 7pm After 7:00 pm call Elink  [phone] 05/24/2024, 7:32 AM

## 2024-05-24 NOTE — TOC Initial Note (Signed)
 Transition of Care [facility]) - Initial/Assessment Note    Patient Details  Name: Rhonda [name] MRN: [id number] Date of Birth: 03-12-1951  Transition of Care [facility]) CM/SW Contact:    Sheri ONEIDA Sharps, LCSW Phone Number: 05/24/2024, 1:39 PM  Clinical Narrative:                 Pt from home with spouse. Pt on 4L of O2 currently gets O2 services from [employer]. PT eval pending. Pt continues medical workup. TOC following for DC needs.     Barriers to Discharge: Continued Medical Work up   Patient Goals and CMS Choice Patient states their goals for this hospitalization and ongoing recovery are:: return home CMS Medicare.gov Compare Post Acute Care list provided to::  (NA) Choice offered to / list presented to : NA  ownership interest in [facility].provided to::  (NA)    Expected Discharge Plan and Services In-house Referral: NA Discharge Planning Services: NA   Living arrangements for the past 2 months: Single Family Home                 DME Arranged: N/A DME Agency: NA       HH Arranged: NA HH Agency: NA        Prior Living Arrangements/Services Living arrangements for the past 2 months: Single Family Home Lives with:: Spouse Patient language and need for interpreter reviewed:: Yes Do you feel safe going back to the place where you live?: Yes      Need for Family Participation in Patient Care: Yes (Comment) Care giver support system in place?: Yes (comment) Current home services: DME Criminal Activity/Legal Involvement Pertinent to Current Situation/Hospitalization: No - Comment as needed  Activities of Daily Living   ADL Screening (condition at time of admission) Independently performs ADLs?: Yes (appropriate for developmental age) Is the patient deaf or have difficulty hearing?: No Does the patient have difficulty seeing, even when wearing glasses/contacts?: Yes Does the patient have difficulty concentrating, remembering, or making decisions?:  No  Permission Sought/Granted                  Emotional Assessment Appearance:: Appears stated age Attitude/Demeanor/Rapport: Engaged Affect (typically observed): Accepting Orientation: : Oriented to Self, Oriented to Place, Oriented to  Time, Oriented to Situation Alcohol / Substance Use: Not Applicable Psych Involvement: No (comment)  Admission diagnosis:  Shortness of breath [R06.02] Hypoxia [R09.02] Acute hypoxic respiratory failure (HCC) [J96.01] Patient Active Problem List   Diagnosis Date Noted   Acute pulmonary embolism (HCC) 05/24/2024   Acute hypoxic respiratory failure (HCC) 05/22/2024   Interstitial lung disease (HCC) 03/14/2024   CAP (community acquired pneumonia) 03/07/2024   Diabetes mellitus, type II (HCC) 02/18/2024   Pulmonary HTN (HCC) 03/06/2020   Chronic respiratory failure with hypoxia (HCC) 12/14/2019   Overweight (BMI 25.0-29.9) 10/04/2019   S/P hip replacement, left 10/03/2019   Pain of left hip joint 08/20/2019   Pulmonary fibrosis (HCC) 04/25/2018   Yeast dermatitis 04/25/2018   Coronary artery calcification seen on CAT scan 08/19/2017   Mixed hyperlipidemia 08/19/2017   PVC (premature ventricular contraction) 08/19/2017   Hypoxemia 03/18/2017   Erroneous encounter - disregard 02/17/2017   Irritable larynx syndrome 01/13/2016   Allergic rhinitis due to pollen 01/06/2016   Mild persistent asthma 01/06/2016   Allergy with anaphylaxis due to food 01/06/2016   Current use of beta blocker 01/06/2016   Gastroesophageal reflux disease 01/06/2016   Tachycardia 01/06/2016   H/O food anaphylaxis 01/06/2016  Acute bronchitis 10/11/2015   Obese 09/11/2015   S/P right THA, AA 09/10/2015   Pre-diabetes 08/22/2015   Preop pulmonary/respiratory exam 07/08/2015   MVP (mitral valve prolapse)    Heart palpitations 02/28/2015   Right-sided chest wall pain 12/19/2014   Cough 12/19/2014   Wheezing 10/20/2014   ILD (interstitial lung disease) (HCC)  09/20/2014   Upper respiratory infection 08/25/2014   Irritable bowel syndrome (IBS) 02/08/2014   Spontaneous pneumothorax- Right 08/08/2013   High risk medication use 01/03/2013   Osteopenia 10/28/2012   Metabolic syndrome 09/20/2012   Hypersensitivity pneumonitis due to bird exposure, ? oil pain and ? mold in house 10/29/2011   Snoring 10/29/2011   Dyslipidemia 04/02/2010   ABNORMAL EKG 04/02/2010   DEPRESSION 03/27/2010   MITRAL REGURGITATION 03/27/2010   DYSPNEA ON EXERTION 03/27/2010   PCP:  Geronimo Amel, MD Pharmacy:   [facility] [place], [place] - [address] [place] [address] Phone: [phone] Fax: [phone]     Social Drivers of Health (SDOH) Social History: SDOH Screenings   Food Insecurity: No Food Insecurity (05/23/2024)  Housing: Low Risk  (05/23/2024)  Transportation Needs: No Transportation Needs (05/23/2024)  Utilities: Not At Risk (05/23/2024)  Depression (PHQ2-9): Low Risk  (05/05/2024)  Social Connections: Socially Integrated (05/23/2024)  Tobacco Use: Low Risk  (05/23/2024)   SDOH Interventions:     Readmission Risk Interventions    05/24/2024   12:50 PM 03/15/2024   11:34 AM  Readmission Risk Prevention Plan  Post Dischage Appt  Complete  Medication Screening  Complete  Transportation Screening Complete Complete  PCP or Specialist Appt within 5-7 Days Complete   Home Care Screening Complete   Medication Review (RN CM) Complete

## 2024-05-24 NOTE — Progress Notes (Signed)
 BLE venous duplex has been completed.   Results can be found under chart review under CV PROC. 05/24/2024 4:07 PM Manvir Thorson RVT, RDMS

## 2024-05-24 NOTE — Progress Notes (Signed)
 [facility]                                                                              Jolynda Townley, is a 73 y.o. female, DOB - 14-Nov-1951, FMW:[id number] Admit date - 05/22/2024    Outpatient Primary MD for the patient is Geronimo Amel, MD  LOS - 2  days  Chief Complaint  Patient presents with   Respiratory Distress       Brief summary   Patient is a 73 y.o. female with interstitial lung disease, CAD, GERD, HTN, HLP, presented to ED due to shortness of breath.  Patient has history of interstitial lung disease and follows with pulmonology.  She is thought to have chronic hypersensitive pneumonitis.  She was started on CellCept  100 mg twice daily and is on prednisone  with a taper.  They recommended that she present to the emergency department due to hypoxia.  On arrival to the emergency department she was afebrile hemodynamically stable.  Pulmonology evaluated the patient and planned for bronchoscopy.   Labs were obtained which showed glucose 234, creatinine 0.89, WBC 13.5, hemoglobin 13.7, troponin within normal limits, BNP 89, respiratory viral panel negative.  Lactic acid 2.1.  Patient underwent chest imaging which showed no acute infiltrates.  CTA pulmonary embolism study demonstrated small bilateral PEs.  Patient was started on heparin  drip and admitted for further workup.  Underwent a bronchoscopy on 7/1.  Assessment & Plan    Principal Problem:   Acute hypoxic respiratory failure (HCC) likely due to interstitial lung disease flare and PE - History of hypersensitive pneumonitis related pulmonary fibrosis/interstitial lung disease, immunosuppressed at baseline with chronic steroids and CellCept  - Pulmonology following, underwent bronchoscopy, 7/1, follow BAL studies.  Culture, cytology pending.  Cell count with monocyte, macrophage predominance - Appreciate recommendations, continue IV Solu-Medrol , Bactrim  for prophylaxis - Continue Zithromax , Rocephin - CT  chest abdomen pelvis to rule out any occult malignancy - Increase mobility with PT, mobilize   Acute pulmonary embolism -Will transition IV heparin  drip to Eliquis, appreciate pharmacy for checking co-pays - Follow lower extremity venous Dopplers - Pulmonology ordered CT abdomen pelvis to rule out any occult malignancy, outpatient screening  Diabetes mellitus type 2, uncontrolled with hyperglycemia - Likely uncontrolled due to steroids Hemoglobin A1c 7.2 on 03/15/2024, will recheck again CBG (last 3)  Recent Labs    05/24/24 0734 05/24/24 0936 05/24/24 1141  GLUCAP 284* 285* 309*   Currently on resistant customized NovoLog  sliding scale insulin , Semglee  15 units daily - Will likely do better with twice daily dosing of Semglee , will discuss with diabetic coordinator   Hyperlipidemia Continue Zetia , statin  Hypertension -Stable, continue diltiazem , Toprol  XL   Estimated body mass index is 29.89 kg/m as calculated from the following:   Height as of this encounter: 4' 11 (1.499 m).   Weight as of this encounter: 67.1 kg.  Code Status: Full code DVT Prophylaxis:  SCDs Start: 05/22/24 1756 apixaban (ELIQUIS) tablet 10 mg  apixaban (ELIQUIS) tablet 5 mg   Level of Care: Level of care: Telemetry Family Communication: Updated patient Disposition Plan:      Remains inpatient appropriate:  Procedures:  Bronchoscopy 7/1  Consultants:   Pulmonology  Antimicrobials:   Anti-infectives (From admission, onward)    Start     Dose/Rate Route Frequency Ordered Stop   05/25/24 0800  azithromycin  (ZITHROMAX ) tablet 500 mg        500 mg Oral Daily 05/24/24 1226     05/24/24 0900  sulfamethoxazole -trimethoprim  (BACTRIM  DS) 800-160 MG per tablet 1 tablet        1 tablet Oral Once per day on Monday Wednesday Friday 05/22/24 2234     05/23/24 1000  sulfamethoxazole -trimethoprim  (BACTRIM  DS) 800-160 MG per tablet 1 tablet  Status:  Discontinued        1 tablet Oral Daily  05/22/24 2229 05/22/24 2234   05/22/24 1630  cefTRIAXone (ROCEPHIN) 2 g in sodium chloride  0.9 % 100 mL IVPB        2 g 200 mL/hr over 30 Minutes Intravenous Every 12 hours 05/22/24 1557     05/22/24 1630  azithromycin  (ZITHROMAX ) 500 mg in sodium chloride  0.9 % 250 mL IVPB  Status:  Discontinued        500 mg 250 mL/hr over 60 Minutes Intravenous Daily 05/22/24 1557 05/24/24 1226          Medications  albuterol   2.5 mg Nebulization Q6H   apixaban  10 mg Oral BID   Followed by   NOREEN ON 05/31/2024] apixaban  5 mg Oral BID   arformoterol  15 mcg Nebulization BID   [START ON 05/25/2024] azithromycin   500 mg Oral Daily   budesonide   0.5 mg Nebulization BID   diltiazem   120 mg Oral Daily   famotidine   20 mg Oral BID   insulin  aspart  0-24 Units Subcutaneous TID WC   insulin  glargine-yfgn  15 Units Subcutaneous Daily   iohexol   1,000 mL Oral Q1H   methylPREDNISolone  (SOLU-MEDROL ) injection  125 mg Intravenous BID   metoprolol  succinate  25 mg Oral Daily   mycophenolate   500 mg Oral BID   pravastatin   40 mg Oral QPM   revefenacin  175 mcg Nebulization Daily   sulfamethoxazole -trimethoprim   1 tablet Oral Once per day on Monday Wednesday Friday      Subjective:   Nioka Thorington was seen and examined today.  No acute chest pain or shortness of breath.  Bronchoscopy yesterday.   No acute events overnight.  On O2 4 L via [place]  Objective:   Vitals:   05/24/24 0500 05/24/24 0834 05/24/24 0838 05/24/24 0839  BP: 125/78     Pulse: 85     Resp:      Temp: 98 F (36.7 C)     TempSrc: Oral     SpO2: 98% 99% 99% 99%  Weight:      Height:        Intake/Output Summary (Last 24 hours) at 05/24/2024 1244 Last data filed at 05/24/2024 0817 Gross per 24 hour  Intake 1377.44 ml  Output --  Net 1377.44 ml     Wt Readings from Last 3 Encounters:  05/23/24 67.1 kg  05/11/24 66.9 kg  05/05/24 66 kg     Exam General: Alert and oriented x 3, NAD Cardiovascular: S1 S2 auscultated,   RRR Respiratory: Clear to auscultation bilaterally, no acute wheezing. Gastrointestinal: Soft, nontender, nondistended, + bowel sounds Ext: no pedal edema bilaterally Neuro: no neurodeficits Psych: Normal affect     Data Reviewed:  I have personally reviewed following labs    CBC Lab Results  Component Value Date  WBC 20.3 (H) 05/24/2024   RBC 4.50 05/24/2024   HGB 12.5 05/24/2024   HCT 40.6 05/24/2024   MCV 90.2 05/24/2024   MCH 27.8 05/24/2024   PLT 182 05/24/2024   MCHC 30.8 05/24/2024   RDW 18.7 (H) 05/24/2024   LYMPHSABS 2.6 05/12/2024   MONOABS 1.2 (H) 05/12/2024   EOSABS 0.3 05/12/2024   BASOSABS 0.1 05/12/2024     Last metabolic panel Lab Results  Component Value Date   NA 139 05/24/2024   K 4.0 05/24/2024   CL 106 05/24/2024   CO2 24 05/24/2024   BUN 26 (H) 05/24/2024   CREATININE 1.08 (H) 05/24/2024   GLUCOSE 164 (H) 05/24/2024   GFRNONAA 54 (L) 05/24/2024   GFRAA >60 10/04/2019   CALCIUM  8.5 (L) 05/24/2024   PHOS 2.8 04/05/2024   PROT 6.3 05/12/2024   ALBUMIN 3.7 05/12/2024   LABGLOB 2.6 12/08/2018   AGRATIO 1.5 12/08/2018   BILITOT 0.5 05/12/2024   ALKPHOS 39 05/12/2024   AST 19 05/12/2024   ALT 22 05/12/2024   ANIONGAP 9 05/24/2024    CBG (last 3)  Recent Labs    05/24/24 0734 05/24/24 0936 05/24/24 1141  GLUCAP 284* 285* 309*      Coagulation Profile: No results for input(s): INR, PROTIME in the last 168 hours.   Radiology Studies: I have personally reviewed the imaging studies  CT Angio Chest PE W/Cm &/Or Wo Cm Result Date: 05/22/2024 CLINICAL DATA:  Positive D-dimer, hypoxia, labored breathing, history of interstitial lung disease EXAM: CT ANGIOGRAPHY CHEST WITH CONTRAST TECHNIQUE: Multidetector CT imaging of the chest was performed using the standard protocol during bolus administration of intravenous contrast. Multiplanar CT image reconstructions and MIPs were obtained to evaluate the vascular anatomy. RADIATION DOSE  REDUCTION: This exam was performed according to the departmental dose-optimization program which includes automated exposure control, adjustment of the mA and/or kV according to patient size and/or use of iterative reconstruction technique. CONTRAST:  75mL OMNIPAQUE  IOHEXOL  350 MG/ML SOLN COMPARISON:  03/14/2024 FINDINGS: Cardiovascular: This is a technically adequate evaluation of the pulmonary vasculature. There is a subsegmental right lower lobe pulmonary embolus, reference image 78/4. Nonocclusive filling defect within the left upper lobe pulmonary artery reference image 54/4 consistent with age indeterminate pulmonary embolus. Clot burden is minimal. No right heart strain. The heart is unremarkable without pericardial effusion. Normal caliber of the thoracic aorta. Atherosclerosis of the aorta and coronary vasculature. Mediastinum/Nodes: Stable borderline enlarged mediastinal lymph nodes, likely reactive. No pathologic adenopathy. Thyroid , trachea, and esophagus are stable. Lungs/Pleura: Stable areas of bilobed bilateral pulmonary fibrosis and scarring, with minimal traction bronchiectasis unchanged. Stable areas of areas of ground-glass opacity most pronounced in the upper lobes consistent with pneumonitis. No dense consolidation, effusion, or pneumothorax. Central airways are patent. Upper Abdomen: No acute abnormality. Musculoskeletal: No acute or destructive bony abnormalities. Reconstructed images demonstrate no additional findings. Review of the MIP images confirms the above findings. IMPRESSION: 1. Small bilateral pulmonary emboli within the right lower and left upper lobes. Minimal clot burden, with no evidence of right heart strain. 2. Stable findings of chronic pulmonary fibrosis, without appreciable change in the scattered ground-glass opacity seen previously consistent with pneumonitis. 3. Aortic Atherosclerosis (ICD10-I70.0). Coronary artery atherosclerosis. Critical Value/emergent results were  called by telephone at the time of interpretation on 05/22/2024 at 4:47 pm to provider ANDREW TEE, who verbally acknowledged these results. Electronically Signed   By: Ozell Daring M.D.   On: 05/22/2024 16:51   DG Chest 2 View  Result Date: 05/22/2024 CLINICAL DATA:  Shortness of breath.  Hypoxia. EXAM: CHEST - 2 VIEW COMPARISON:  03/14/2024 FINDINGS: Normal-sized heart. Stable extensive chronic interstitial prominence. Stable right middle lobe linear scarring. Unremarkable bones. IMPRESSION: 1. No acute abnormality. 2. Stable extensive chronic interstitial lung disease. Electronically Signed   By: Elspeth Bathe M.D.   On: 05/22/2024 13:21       Taelor Moncada M.D. [facility] 05/24/2024, 12:44 PM  Available via Epic secure chat 7am-7pm After 7 pm, please refer to night coverage provider listed on amion.

## 2024-05-24 NOTE — Discharge Instructions (Addendum)
 Information on my medicine - ELIQUIS  (apixaban )  Why was Eliquis  prescribed for you? Eliquis  was prescribed to treat blood clots that may have been found in the veins of your legs (deep vein thrombosis) or in your lungs (pulmonary embolism) and to reduce the risk of them occurring again.  What do You need to know about Eliquis  ? The  dose is  ONE 5 mg tablet taken TWICE daily.  Eliquis  may be taken with or without food.   Try to take the dose about the same time in the morning and in the evening. If you have difficulty swallowing the tablet whole please discuss with your pharmacist how to take the medication safely.  Take Eliquis  exactly as prescribed and DO NOT stop taking Eliquis  without talking to the doctor who prescribed the medication.  Stopping may increase your risk of developing a new blood clot.  Refill your prescription before you run out.  After discharge, you should have regular check-up appointments with your healthcare provider that is prescribing your Eliquis .    What do you do if you miss a dose? If a dose of ELIQUIS  is not taken at the scheduled time, take it as soon as possible on the same day and twice-daily administration should be resumed. The dose should not be doubled to make up for a missed dose.  Important Safety Information A possible side effect of Eliquis  is bleeding. You should call your healthcare provider right away if you experience any of the following: ? Bleeding from an injury or your nose that does not stop. ? Unusual colored urine (red or dark brown) or unusual colored stools (red or black). ? Unusual bruising for unknown reasons. ? A serious fall or if you hit your head (even if there is no bleeding).  Some medicines may interact with Eliquis  and might increase your risk of bleeding or clotting while on Eliquis . To help avoid this, consult your healthcare provider or pharmacist prior to using any new prescription or non-prescription  medications, including herbals, vitamins, non-steroidal anti-inflammatory drugs (NSAIDs) and supplements.  This website has more information on Eliquis  (apixaban ): http://[email or web address] .com/eliquis dena

## 2024-05-24 NOTE — Anesthesia Postprocedure Evaluation (Signed)
 Anesthesia Post Note  Patient: Rhonda [name]  Procedure(s) Performed: VIDEO BRONCHOSCOPY WITHOUT FLUORO (Bilateral)     Patient location during evaluation: Endoscopy Anesthesia Type: General Level of consciousness: awake and alert Pain management: pain level controlled Vital Signs Assessment: post-procedure vital signs reviewed and stable Respiratory status: spontaneous breathing, nonlabored ventilation, respiratory function stable and patient connected to nasal cannula oxygen  Cardiovascular status: blood pressure returned to baseline and stable Postop Assessment: no apparent nausea or vomiting Anesthetic complications: no   No notable events documented.  Last Vitals:  Vitals:   05/24/24 0838 05/24/24 0839  BP:    Pulse:    Resp:    Temp:    SpO2: 99% 99%    Last Pain:  Vitals:   05/24/24 0500  TempSrc: Oral  PainSc:                  [name] [name]

## 2024-05-24 NOTE — Telephone Encounter (Signed)
 Patient Product/process development scientist completed.    The patient is insured through [employer]. Patient has Medicare and is not eligible for a copay card, but may be able to apply for patient assistance or Medicare RX Payment Plan (Patient Must reach out to their plan, if eligible for payment plan), if available.    Ran test claim for Eliquis 5 mg and the current 30 day co-pay is $40.00.  Ran test claim for Xarelto 20 mg and the current 30 day co-pay is $40.00.  This test claim was processed through [facility]- copay amounts may vary at other pharmacies due to pharmacy/plan contracts, or as the patient moves through the different stages of their insurance plan.     Roland Earl, CPHT Pharmacy Technician III Certified Patient Advocate [facility] Patient Advocate Team Direct Number: [phone]  Fax: [phone]

## 2024-05-24 NOTE — Plan of Care (Signed)
  Problem: Clinical Measurements: Goal: Respiratory complications will improve Outcome: Progressing   Problem: Nutrition: Goal: Adequate nutrition will be maintained Outcome: Progressing   Problem: Safety: Goal: Ability to remain free from injury will improve Outcome: Progressing   

## 2024-05-25 ENCOUNTER — Encounter (HOSPITAL_COMMUNITY)

## 2024-05-25 ENCOUNTER — Telehealth (HOSPITAL_COMMUNITY): Payer: Self-pay

## 2024-05-25 DIAGNOSIS — E119 Type 2 diabetes mellitus without complications: Secondary | ICD-10-CM | POA: Diagnosis not present

## 2024-05-25 DIAGNOSIS — Z794 Long term (current) use of insulin: Secondary | ICD-10-CM | POA: Diagnosis not present

## 2024-05-25 DIAGNOSIS — J9621 Acute and chronic respiratory failure with hypoxia: Secondary | ICD-10-CM | POA: Diagnosis not present

## 2024-05-25 DIAGNOSIS — J679 Hypersensitivity pneumonitis due to unspecified organic dust: Secondary | ICD-10-CM | POA: Diagnosis not present

## 2024-05-25 DIAGNOSIS — J9601 Acute respiratory failure with hypoxia: Secondary | ICD-10-CM | POA: Diagnosis not present

## 2024-05-25 LAB — ACID FAST SMEAR (AFB, MYCOBACTERIA)
Acid Fast Smear: NEGATIVE
Acid Fast Smear: NEGATIVE

## 2024-05-25 LAB — CBC
HCT: 42.7 % (ref 36.0–46.0)
Hemoglobin: 13.1 g/dL (ref 12.0–15.0)
MCH: 28.1 pg (ref 26.0–34.0)
MCHC: 30.7 g/dL (ref 30.0–36.0)
MCV: 91.6 fL (ref 80.0–100.0)
Platelets: 195 10*3/uL (ref 150–400)
RBC: 4.66 MIL/uL (ref 3.87–5.11)
RDW: 18.7 % — ABNORMAL HIGH (ref 11.5–15.5)
WBC: 20.3 10*3/uL — ABNORMAL HIGH (ref 4.0–10.5)
nRBC: 0 % (ref 0.0–0.2)

## 2024-05-25 LAB — RENAL FUNCTION PANEL
Albumin: 3.1 g/dL — ABNORMAL LOW (ref 3.5–5.0)
Anion gap: 7 (ref 5–15)
BUN: 27 mg/dL — ABNORMAL HIGH (ref 8–23)
CO2: 27 mmol/L (ref 22–32)
Calcium: 8.8 mg/dL — ABNORMAL LOW (ref 8.9–10.3)
Chloride: 103 mmol/L (ref 98–111)
Creatinine, Ser: 0.91 mg/dL (ref 0.44–1.00)
GFR, Estimated: >60 mL/min (ref >60)
Glucose, Bld: 228 mg/dL — ABNORMAL HIGH (ref 70–99)
Phosphorus: 3.9 mg/dL (ref 2.5–4.6)
Potassium: 4.3 mmol/L (ref 3.5–5.1)
Sodium: 137 mmol/L (ref 135–145)

## 2024-05-25 LAB — GLUCOSE, CAPILLARY
Glucose-Capillary: 242 mg/dL — ABNORMAL HIGH (ref 70–99)
Glucose-Capillary: 404 mg/dL — ABNORMAL HIGH (ref 70–99)
Glucose-Capillary: 284 mg/dL — ABNORMAL HIGH (ref 70–99)
Glucose-Capillary: 221 mg/dL — ABNORMAL HIGH (ref 70–99)
Glucose-Capillary: 279 mg/dL — ABNORMAL HIGH (ref 70–99)

## 2024-05-25 LAB — CYTOLOGY - NON PAP

## 2024-05-25 MED ORDER — DIPHENHYDRAMINE HCL 50 MG/ML IJ SOLN: 50.0000 mg | Freq: Once | INTRAMUSCULAR | Status: DC | PRN

## 2024-05-25 MED ORDER — FAMOTIDINE IN NACL 20-0.9 MG/50ML-% IV SOLN: 20.0000 mg | Freq: Once | INTRAVENOUS | Status: DC | PRN

## 2024-05-25 MED ORDER — METHYLPREDNISOLONE SODIUM SUCC 125 MG IJ SOLR: 125.0000 mg | Freq: Once | INTRAMUSCULAR | Status: DC | PRN

## 2024-05-25 MED ORDER — SODIUM CHLORIDE 0.9 % IV BOLUS: 1000.0000 mL | Freq: Once | INTRAVENOUS | Status: DC | PRN

## 2024-05-25 MED ORDER — ACETAMINOPHEN 325 MG PO TABS
650.0000 mg | ORAL_TABLET | Freq: Once | ORAL | Status: AC
  Administered 2024-05-25: 650 mg via ORAL
  Filled 2024-05-25: qty 2

## 2024-05-25 MED ORDER — SODIUM CHLORIDE 0.9% FLUSH
10.0000 mL | Freq: Two times a day (BID) | INTRAVENOUS | Status: DC
  Administered 2024-05-26 – 2024-05-30 (×7): 10 mL

## 2024-05-25 MED ORDER — MYCOPHENOLATE MOFETIL 250 MG PO CAPS
1000.0000 mg | ORAL_CAPSULE | Freq: Two times a day (BID) | ORAL | Status: DC
  Administered 2024-05-25 – 2024-05-30 (×9): 1000 mg via ORAL
  Filled 2024-05-25 (×10): qty 4

## 2024-05-25 MED ORDER — CHLORHEXIDINE GLUCONATE CLOTH 2 % EX PADS
6.0000 | MEDICATED_PAD | Freq: Every day | CUTANEOUS | Status: DC
  Administered 2024-05-26 – 2024-05-30 (×5): 6 via TOPICAL

## 2024-05-25 MED ORDER — ALBUTEROL SULFATE (2.5 MG/3ML) 0.083% IN NEBU
2.5000 mg | INHALATION_SOLUTION | Freq: Two times a day (BID) | RESPIRATORY_TRACT | Status: DC
  Administered 2024-05-25: 2.5 mg via RESPIRATORY_TRACT
  Filled 2024-05-25 (×2): qty 3

## 2024-05-25 MED ORDER — EPINEPHRINE PF 1 MG/ML IJ SOLN: 0.3000 mg | INTRAMUSCULAR | Status: DC | PRN

## 2024-05-25 MED ORDER — SODIUM CHLORIDE 0.9 % IV SOLN
1000.0000 mg | Freq: Once | INTRAVENOUS | Status: AC
  Administered 2024-05-25: 1000 mg via INTRAVENOUS
  Filled 2024-05-25: qty 100

## 2024-05-25 MED ORDER — DIPHENHYDRAMINE HCL 25 MG PO CAPS
50.0000 mg | ORAL_CAPSULE | Freq: Once | ORAL | Status: AC
  Administered 2024-05-25: 50 mg via ORAL
  Filled 2024-05-25: qty 2

## 2024-05-25 MED ORDER — METHYLPREDNISOLONE SODIUM SUCC 125 MG IJ SOLR
100.0000 mg | Freq: Once | INTRAMUSCULAR | Status: AC
  Administered 2024-05-25: 100 mg via INTRAVENOUS
  Filled 2024-05-25: qty 2

## 2024-05-25 MED ORDER — ALBUTEROL SULFATE (2.5 MG/3ML) 0.083% IN NEBU
2.5000 mg | INHALATION_SOLUTION | Freq: Once | RESPIRATORY_TRACT | Status: DC | PRN
  Filled 2024-05-25: qty 3

## 2024-05-25 NOTE — Hospital Course (Addendum)
 73 y.o. female with interstitial lung disease, CAD, GERD, HTN, HLP chronic hypersensitive pneumonitis on CellCept  prednisone , presented to ED due to shortness of breath,. In ED afebrile hemodynamically stable.  PCCM consulted and admitted, Labs were obtained which showed glucose 234, creat 0.89, WBC 13.5, hemoglobin 13.7, troponin within normal limits, BNP 89, respiratory viral panel negative.  Lactic acid 2.1.  CXR>no acute infiltrates. CTA> Small bilateral Pes-heparin  drip started S/P bronchoscopy on 7/1.  7/4: Hemodynamically stable.  Developed bleeding per rectum, had 2 episodes this morning.  GI was consulted and holding Eliquis .  Hemoglobin decreased to 12.5 from 13.2 today. Received rituximab  infusion yesterday.  Subjective: Patient was seen and examined today.  She was complaining of having bleeding per rectum, 1 small episode and 1 with significant amount of bleeding.  No pain.  No dark-colored stools.  Assessment and plan:  AoCHRF ILD flareup  Small bilateral PE-low clot burden-dvt study neg Chronic hypersensitive pneumonitis: Patient on chronic steroids and CellCept  at baseline presented with shortness of breath, CT imaging shows small bilateral PE, concern for ILD flareup PCCM following.  S/p bronchoscopy 7/1, follow BAL studies. F/u culture, cytology.Cell count with monocyte, macrophage predominance.  Managing with anticoagulation, now on Eliquis , on antibiotic with ceftriaxone , azithromycin , Solu-Medrol  125 mg twice daily and CellCept  Bactrim  bronchodilators/nebs.  Noted plan to increase CellCept , antibiotics x 5 possibly adding IV Rituxan  versus CellCept , received Rituxan  yesterday Continue supple oxygen  to keep above 92%, continue plan of care as per pulmonary CT chest abdomen pelvis-7/2 no lymphadenopathy or metastatic disease Cont to Increase mobility with PT, mobilize  Bleeding per rectum.  Patient started having some bleeding per rectum, small decrease in hemoglobin to 12.5  from 13.2.  Patient was recently started on Eliquis . - Holding Eliquis  today -GI was consulted   Diabetes mellitus type 2, uncontrolled with hyperglycemia: Uncontrolled in the setting of steroid A1c 7.2 on Semglee  15 units daily, SSI, monitor and adjust   Hyperlipidemia Continue Zetia , statin   Hypertension Stable, continue diltiazem , Toprol  XL   Vision changes with blurriness: Possibly due to hyperglycemia with steroid use ophthalmology evaluation rule out diabetic retinopathy.  Monitor

## 2024-05-25 NOTE — Progress Notes (Signed)
 PROGRESS NOTE Rhonda [name]  FMW:[id number] DOB: [birth year] DOA: 05/22/2024 PCP: Geronimo Amel, MD  Brief Narrative/Hospital Course: 73 y.o. female with interstitial lung disease, CAD, GERD, HTN, HLP chronic hypersensitive pneumonitis on CellCept  prednisone , presented to ED due to shortness of breath,. In ED afebrile hemodynamically stable.  PCCM consulted and admitted, Labs were obtained which showed glucose 234, creat 0.89, WBC 13.5, hemoglobin 13.7, troponin within normal limits, BNP 89, respiratory viral panel negative.  Lactic acid 2.1.  CXR>no acute infiltrates. CTA> Small bilateral Pes-heparin  drip started S/P bronchoscopy on 7/1.  Subjective: Patient seen and examined Having dry mouth, small bleeding and some blood in the cough but mostly greenish  Overnight afebrile BP stable on 3 L nasal cannula Labs blood sugar 240s stable electrolytes leukocytosis stable 20K No new complaints Patient is tells me that her pulmonary doctor is thinking of adding infusion  Assessment and plan:  AoCHRF ILD flareup  Small bilateral PE-low clot burden-dvt study neg Chronic hypersensitive pneumonitis: Patient on chronic steroids and CellCept  at baseline presented with shortness of breath, CT imaging shows small bilateral PE, concern for ILD flareup PCCM following.  S/p bronchoscopy 7/1, follow BAL studies. F/u culture, cytology.Cell count with monocyte, macrophage predominance.  Managing with anticoagulation, now on Eliquis, on antibiotic with ceftriaxone, azithromycin , Solu-Medrol  125 mg twice daily and CellCept  Bactrim  bronchodilators/nebs.  Noted plan to increase CellCept , antibiotics x 5 possibly adding IV Rituxan versus CellCept , Continue supple oxygen  to keep above 92%, continue plan of care as per pulmonary CT chest abdomen pelvis-7/2 no lymphadenopathy or metastatic disease Cont to Increase mobility with PT, mobilize   Diabetes mellitus type 2, uncontrolled with  hyperglycemia: Uncontrolled in the setting of steroid A1c 7.2 on Semglee  15 units daily, SSI, monitor and adjust Recent Labs  Lab 05/24/24 0936 05/24/24 1141 05/24/24 1641 05/24/24 2207 05/25/24 0725  GLUCAP 285* 309* 140* 238* 242*    Hyperlipidemia Continue Zetia , statin   Hypertension Stable, continue diltiazem , Toprol  XL   Vision changes with blurriness: Possibly due to hyperglycemia with steroid use ophthalmology evaluation rule out diabetic retinopathy.  Monitor  DVT prophylaxis: SCDs Start: 05/22/24 1756 Code Status:   Code Status: Full Code Family Communication: plan of care discussed with patient at bedside. Patient status is: Remains hospitalized because of severity of illness Level of care: Telemetry   Dispo: The patient is from: home            Anticipated disposition: TBD  Objective: Vitals last 24 hrs: Vitals:   05/24/24 1949 05/25/24 0509 05/25/24 0737 05/25/24 0825  BP: 132/65 (!) 148/73 131/65   Pulse: 73 66 65   Resp: 15 18 16    Temp: 98.1 F (36.7 C) 98.3 F (36.8 C) 98.1 F (36.7 C)   TempSrc: Oral Oral Oral   SpO2: 97% 100% 100% 99%  Weight:      Height:        Physical Examination: General exam: alert awake, older than stated age HEENT:Oral mucosa moist, Ear/Nose WNL grossly Respiratory system: Bilaterally clear BS, no use of accessory muscle Cardiovascular system: S1 & S2 +. Gastrointestinal system: Abdomen soft, NT,ND,BS+ Nervous System: Alert, awake, following commands. Extremities: LE edema neg, warm extremities Skin: No rashes,warm. MSK: Normal muscle bulk/tone.   Data Reviewed: I have personally reviewed following labs and imaging studies ( see epic result tab) CBC: Recent Labs  Lab 05/22/24 1301 05/23/24 0542 05/24/24 0028 05/25/24 0527  WBC 15.5* 16.5* 20.3* 20.3*  HGB 13.7 13.6 12.5 13.1  HCT 44.8  44.2 40.6 42.7  MCV 90.5 91.1 90.2 91.6  PLT 238 199 182 195   CMP: Recent Labs  Lab 05/22/24 1301 05/24/24 0028  05/25/24 0527  NA 137 139 137  K 4.2 4.0 4.3  CL 106 106 103  CO2 21* 24 27  GLUCOSE 234* 164* 228*  BUN 36* 26* 27*  CREATININE 0.89 1.08* 0.91  CALCIUM  9.1 8.5* 8.8*  PHOS  --   --  3.9   GFR: Estimated Creatinine Clearance: 44.5 mL/min (by C-G formula based on SCr of 0.91 mg/dL). Recent Labs  Lab 05/25/24 0527  ALBUMIN 3.1*   No results for input(s): LIPASE, AMYLASE in the last 168 hours. No results for input(s): AMMONIA in the last 168 hours. Coagulation Profile: No results for input(s): INR, PROTIME in the last 168 hours. Unresulted Labs (From admission, onward)     Start     Ordered   05/25/24 0500  Renal function panel  Daily,   R     Question:  Specimen collection method  Answer:  Lab=Lab collect   05/24/24 0558   05/23/24 1423  Fungus Culture With Stain  RELEASE UPON ORDERING,   TIMED       Comments: Specimen A: Specimen Description RUL BAL Phone [phone] Immunocompromised?  No  Antibiotic Treatment:  none Is the patient on airborne/droplet precautions? No Clinical History:  N/A Special Instructions:  none Specimen Disposition:  Microbiology, cytology, cell count     05/23/24 1423   05/23/24 1423  Fungus Culture With Stain  RELEASE UPON ORDERING,   TIMED       Comments: Specimen B: Specimen Description LUL BAL Phone [phone] Immunocompromised?  No  Antibiotic Treatment:  none Is the patient on airborne/droplet precautions? No Clinical History:  N/A Special Instructions:  none Specimen Disposition:  Microbiology, cytology, cell count     05/23/24 1423   05/23/24 1423  Acid Fast Culture with reflexed sensitivities  RELEASE UPON ORDERING,   TIMED       Comments: Specimen A: Specimen Description RUL BAL Phone [phone] Immunocompromised?  No  Antibiotic Treatment:  none Is the patient on airborne/droplet precautions? No Clinical History:  N/A Special Instructions:  none Specimen Disposition:  Microbiology, cytology, cell count     05/23/24  1423   05/23/24 1423  Acid Fast Culture with reflexed sensitivities  RELEASE UPON ORDERING,   TIMED       Comments: Specimen B: Specimen Description LUL BAL Phone [phone] Immunocompromised?  No  Antibiotic Treatment:  none Is the patient on airborne/droplet precautions? No Clinical History:  N/A Special Instructions:  none Specimen Disposition:  Microbiology, cytology, cell count     05/23/24 1423           Antimicrobials/Microbiology: Anti-infectives (From admission, onward)    Start     Dose/Rate Route Frequency Ordered Stop   05/25/24 0800  azithromycin  (ZITHROMAX ) tablet 500 mg        500 mg Oral Daily 05/24/24 1226     05/24/24 0900  sulfamethoxazole -trimethoprim  (BACTRIM  DS) 800-160 MG per tablet 1 tablet        1 tablet Oral Once per day on Monday Wednesday Friday 05/22/24 2234     05/23/24 1000  sulfamethoxazole -trimethoprim  (BACTRIM  DS) 800-160 MG per tablet 1 tablet  Status:  Discontinued        1 tablet Oral Daily 05/22/24 2229 05/22/24 2234   05/22/24 1630  cefTRIAXone (ROCEPHIN) 2 g in sodium chloride  0.9 % 100 mL IVPB  2 g 200 mL/hr over 30 Minutes Intravenous Every 12 hours 05/22/24 1557     05/22/24 1630  azithromycin  (ZITHROMAX ) 500 mg in sodium chloride  0.9 % 250 mL IVPB  Status:  Discontinued        500 mg 250 mL/hr over 60 Minutes Intravenous Daily 05/22/24 1557 05/24/24 1226         Component Value Date/Time   SDES  05/23/2024 1417    BRONCHIAL ALVEOLAR LAVAGE Performed at [facility], [address], [place], [place] [address]    SDES BRONCHIAL ALVEOLAR LAVAGE 05/23/2024 1417   SPECREQUEST LUL 05/23/2024 1417   SPECREQUEST LUL 05/23/2024 1417   CULT  05/23/2024 1417    NO GROWTH 2 DAYS Performed at [facility], [address], [place], [place] [address]    CULT PENDING 05/23/2024 1417   REPTSTATUS PENDING 05/23/2024 1417   REPTSTATUS PENDING 05/23/2024 1417    Procedures: Procedure(s) (LRB): VIDEO  BRONCHOSCOPY WITHOUT FLUORO (Bilateral) Medications reviewed:  Scheduled Meds:  albuterol   2.5 mg Nebulization QID   apixaban  10 mg Oral BID   Followed by   NOREEN ON 05/31/2024] apixaban  5 mg Oral BID   arformoterol  15 mcg Nebulization BID   azithromycin   500 mg Oral Daily   budesonide   0.5 mg Nebulization BID   diltiazem   120 mg Oral Daily   famotidine   20 mg Oral BID   insulin  aspart  0-24 Units Subcutaneous TID WC   insulin  glargine-yfgn  15 Units Subcutaneous Daily   methylPREDNISolone  (SOLU-MEDROL ) injection  125 mg Intravenous BID   metoprolol  succinate  25 mg Oral Daily   mycophenolate   500 mg Oral BID   pravastatin   40 mg Oral QPM   revefenacin  175 mcg Nebulization Daily   sulfamethoxazole -trimethoprim   1 tablet Oral Once per day on Monday Wednesday Friday   Continuous Infusions:  cefTRIAXone (ROCEPHIN)  IV 2 g (05/24/24 2120)    Mennie LAMY, MD [facility] 05/25/2024, 9:59 AM

## 2024-05-25 NOTE — TOC Progression Note (Signed)
 Transition of Care [facility]) - Progression Note    Patient Details  Name: Rhonda [name] MRN: [id number] Date of Birth: 02-26-1951  Transition of Care [facility] CM/SW Contact  Sheri ONEIDA [place], [place] Phone Number: 05/25/2024, 3:48 PM  Clinical Narrative:    Pt recommended for HHPT. Pt accepting of rec. HHPT setup w/ [facility].      Barriers to Discharge: Continued Medical Work up  Expected Discharge Plan and Services In-house Referral: NA Discharge Planning Services: NA   Living arrangements for the past 2 months: Single Family Home                 DME Arranged: N/A DME Agency: NA       HH Arranged: NA HH Agency: NA         Social Determinants of Health (SDOH) Interventions SDOH Screenings   Food Insecurity: No Food Insecurity (05/23/2024)  Housing: Low Risk  (05/23/2024)  Transportation Needs: No Transportation Needs (05/23/2024)  Utilities: Not At Risk (05/23/2024)  Depression (PHQ2-9): Low Risk  (05/05/2024)  Social Connections: Socially Integrated (05/23/2024)  Tobacco Use: Low Risk  (05/23/2024)    Readmission Risk Interventions    05/25/2024    3:48 PM 05/24/2024   12:50 PM 03/15/2024   11:34 AM  Readmission Risk Prevention Plan  Post Dischage Appt   Complete  Medication Screening   Complete  Transportation Screening Complete Complete Complete  PCP or Specialist Appt within 5-7 Days  Complete   PCP or Specialist Appt within 3-5 Days Complete    Home Care Screening  Complete   Medication Review (RN CM)  Complete   HRI or Home Care Consult Complete    Social Work Consult for Recovery Care Planning/Counseling Complete    Palliative Care Screening Not Applicable    Medication Review [employer]) Complete

## 2024-05-25 NOTE — Telephone Encounter (Signed)
 Pt called Pulm Rehab from hospital. Stated she has been dx with 2 small PE, agreed it was best to discharge from this standpoint as she will be receiving home PT.

## 2024-05-25 NOTE — Progress Notes (Addendum)
 NAME:  Rhonda [name], MRN:  [id number], DOB:  Jun 15, 1951, LOS: 3 ADMISSION DATE:  05/22/2024, CONSULTATION DATE:  05/22/2024 REFERRING MD:  Dr. Lenor-  EDP, CHIEF COMPLAINT:  Acute on chronic hypoxic respiratory failure    History of Present Illness:  Rhonda [name] is a 73 y.o. female with PMH significant for  hypersensitive pneumonitis related pulmonary fibrosis/interstitial lung disease immunosuppressed at baseline with chronic steroids, CAD, HTN, HLD, and steroids induce diabetes who presented to the ED at the direction of primary pulmonologist for complaints of hypoxic and change in sputum production.   Of note patient had had a progressive decline in respiratory status over that last year, with worst decline seen after suffering a rhinovirus infection in April. After this admission she was started on Prednisone  taper and CellCept , she tolerated this well until her steroids got to 10 of Prednisone  daily. At this time she began to desaturate quickly. He primary pulmonologist (Dr. Geronimo) wished to admit for further management but patient refused and decision was made to increase steroids back to 60mg  daily and symptoms resolved until 6/30 when hypoxic returned and sputum production changed.   Pertinent  Medical History  Hypersensitive pneumonitis related pulmonary fibrosis/interstitial lung disease immunosuppressed at baseline with chronic steroids, CAD, HTN, HLD, and steroids induce diabetes  Significant Hospital Events: Including procedures, antibiotic start and stop dates in addition to other pertinent events   6/30 Presented with more subjective hypoxia with and change in sputum production.  Started on heparin  for segmental PE 7/1 Bronchoscopy with BAL 7/2 lower extremity duplex with no DVT, CT chest abdomen pelvis with chronic interstitial changes.  No evidence of occult malignancy.  Interim History / Subjective:     Objective    Blood pressure 131/65, pulse 65, temperature  98.1 F (36.7 C), temperature source Oral, resp. rate 16, height 4' 11 (1.499 m), weight 67.1 kg, SpO2 100%.        Intake/Output Summary (Last 24 hours) at 05/25/2024 0824 Last data filed at 05/25/2024 0630 Gross per 24 hour  Intake 360 ml  Output --  Net 360 ml   Filed Weights   05/22/24 1220 05/23/24 0856 05/23/24 1309  Weight: 66.2 kg 68.2 kg 67.1 kg    Examination: Gen:      No acute distress HEENT:  EOMI, sclera anicteric Neck:     No masses; no thyromegaly Lungs:    B/L crackles CV:         Regular rate and rhythm; no murmurs Abd:      + bowel sounds; soft, non-tender; no palpable masses, no distension Ext:    No edema; adequate peripheral perfusion Neuro: alert and oriented x 3 Psych: normal mood and affect   BAL cell count with monocyte, macrophage predominance.  Cultures are negative to date, cytology are pending  Echocardiogram on 6/20 with LVEF 65-70%, grade 1 diastolic dysfunction.  Normal RV size and function, TAPSE 1.9.  No atrial level shunts noted by color-flow Doppler RHC on 5/13 with no pulmonary hypertension.  Normal right and left heart filling pressures Bubble study  Resolved problem list   Assessment and Plan  Acute on chronic hypoxic respiratory failure Chronic hypersensitivity pneumonitis  Chronic hypersensitivity pneumonitis, with recent exacerbation. Symptoms include decreased oxygen  saturation upon exertion. Previous treatments with doxycycline  and steroids were effective, but recent exacerbations have been resistant to multiple antibiotic and steroid cycles. Concerns about progression of interstitial lung disease the CT looks stable.  No evidence of pulmonary hypertension or  RV dysfunction on echocardiogram 6/20 and right heart cath 5/13.  No atrial shunt.  Respiratory virus panel is negative and no evidence of infection by procalcitonin and negative BAL cultures to date  - Administer IV Solumedrol 125 mg twice daily.  Continue Bactrim  for  prophylaxis - Administer IV antibiotics for CAP coverage.  Can stop after 5 days - Increase CellCept  to 1 g twice daily.  Will discuss with Dr. Geronimo her primary pulmonologist about giving IV Rituxan while she is inpatient - Order PT to work with her.  Get out of bed and mobilize. - Referral is pending for pulmonary transplant at [facility]  Pulmonary embolism, low clot burden with no evidence of right heart strain Low clot burden is unlikely to explain all her symptoms.  Heparin  changed to DOAC No lower extremity DVT.  CT chest abdomen pelvis reviewed with no evidence of occult malignancy  Insulin -dependent diabetes mellitus  Insulin -dependent diabetes mellitus secondary to chronic steroid use. Concerns about blood sugar management with increased steroid dosage. Hospitalization will require frequent monitoring and adjustment of insulin .  - Monitor blood sugar every four hours while hospitalized  - Adjust insulin  as needed    Vision changes > improved Vision changes characterized by blurriness and difficulty focusing. Ophthalmologist evaluation ruled out diabetic retinopathy. Possible association with fluctuating blood sugar levels and steroid use.  - Monitor vision changes in relation to blood sugar and steroid levels   Best Practice (right click and Reselect all SmartList Selections daily)   Per primary   Signature:   Costas Sena MD [facility] See Amion for pager  If no response to pager , please call [phone] until 7pm After 7:00 pm call Elink  [phone] 05/25/2024, 8:24 AM

## 2024-05-25 NOTE — Progress Notes (Signed)

## 2024-05-25 NOTE — Progress Notes (Signed)
 Ruxience infusion starting on prior shift with premeds given.  No side effects noted or complaints from the patient at this time.  Oncoming nurse made aware of need for continued titration.  Chiquita LULLA Longs, RN

## 2024-05-25 NOTE — Evaluation (Signed)
 Physical Therapy Evaluation Patient Details Name: Rhonda [name] MRN: [id number] DOB: [birth year] Today's Date: 05/25/2024  History of Present Illness  Rhonda [name] is a 73 y.o. female admitted 05/22/24 with medical history significant of interstitial lung disease, CAD, GERD, hyperlipidemia, hypertension who presented to the emergency department due to shortness of breath.  Clinical Impression  Pt admitted with above diagnosis. Pt agreeable to session, states that her oxygen  is usually well managed at home outside of crisis. She has 2 story home and has been able to move between floors without AD or O2 at times, but requires rest breaks after activity. She is able to come to sit EOB at mod I, notes some dizziness with prior transfers, BP assessed (see below). She is able to complete steps per home setup at supervision A on 3L O2. Pt desats to 90% on 3L with stairs, able to recover with PLB. Descends stairs and trials 2L for level surface amb with O2 to 88% after 17ft with RW, again able to recover and returns to room. Pt currently with functional limitations due to the deficits listed below (see PT Problem List). Pt will benefit from acute skilled PT to increase their independence and safety with mobility to allow discharge.    BP:  Sitting: 155/74, 70bpm, 99% O2 (3L) Standing: 141/73, 75bpm, 98% O2 (3L) Post activity: 164/87, 79bpm, 97% O2 (2L) +3 mins recovery: 148/81, 82bpm, 99% O2 (3L)         If plan is discharge home, recommend the following: Help with stairs or ramp for entrance;A little help with bathing/dressing/bathroom;Assistance with cooking/housework   Can travel by private vehicle        Equipment Recommendations None recommended by PT  Recommendations for Other Services       Functional Status Assessment Patient has had a recent decline in their functional status and demonstrates the ability to make significant improvements in function in a reasonable and predictable  amount of time.     Precautions / Restrictions Precautions Precautions: Fall Recall of Precautions/Restrictions: Intact Restrictions Weight Bearing Restrictions Per Provider Order: No      Mobility  Bed Mobility Overal bed mobility: Modified Independent                  Transfers Overall transfer level: Modified independent Equipment used: Rolling walker (2 wheels)               General transfer comment: bed mobility and STS require inc time, STS with RW    Ambulation/Gait Ambulation/Gait assistance: Supervision Gait Distance (Feet): 450 Feet Assistive device: Rolling walker (2 wheels) Gait Pattern/deviations: Step-through pattern Gait velocity: dec     General Gait Details: Pt uses RW with minimal effort on BUE, but does use to steady and assist with efficiency of task. O2 monitored with amb on 3 vs 2L, O2 remains 88-99% throughout the session.  Stairs Stairs: Yes Stairs assistance: Supervision Stair Management: One rail Left Number of Stairs: 20 General stair comments: reciprocal ascending and non reciprocal descending, rest break before descending  Wheelchair Mobility     Tilt Bed    Modified Rankin (Stroke Patients Only)       Balance Overall balance assessment: Mild deficits observed, not formally tested                                           Pertinent Vitals/Pain  Pain Assessment Pain Assessment: No/denies pain    Home Living Family/patient expects to be discharged to:: Private residence Living Arrangements: Spouse/significant other Available Help at Discharge: Family Type of Home: House Home Access: Stairs to enter Entrance Stairs-Rails: Left Entrance Stairs-Number of Steps: 3 Alternate Level Stairs-Number of Steps: 14 Home Layout: Two level;Able to live on main level with bedroom/bathroom Home Equipment: Rolling Walker (2 wheels);Cane - single point;Tub bench      Prior Function Prior Level of Function :  Independent/Modified Independent                     Extremity/Trunk Assessment   Upper Extremity Assessment Upper Extremity Assessment: Overall WFL for tasks assessed    Lower Extremity Assessment Lower Extremity Assessment: Overall WFL for tasks assessed    Cervical / Trunk Assessment Cervical / Trunk Assessment: Normal  Communication   Communication Communication: No apparent difficulties    Cognition Arousal: Alert Behavior During Therapy: WFL for tasks assessed/performed   PT - Cognitive impairments: No apparent impairments                         Following commands: Intact       Cueing Cueing Techniques: Verbal cues, Gestural cues     General Comments      Exercises     Assessment/Plan    PT Assessment Patient needs continued PT services  PT Problem List Decreased mobility;Decreased activity tolerance;Cardiopulmonary status limiting activity       PT Treatment Interventions DME instruction;Functional mobility training;Balance training;Patient/family education;Gait training;Therapeutic activities;Stair training;Therapeutic exercise    PT Goals (Current goals can be found in the Care Plan section)  Acute Rehab PT Goals Patient Stated Goal: improve activity tolerance and O2 sats with activity PT Goal Formulation: With patient Time For Goal Achievement: 06/08/24 Potential to Achieve Goals: Good    Frequency Min 2X/week     Co-evaluation               AM-PAC PT 6 Clicks Mobility  Outcome Measure Help needed turning from your back to your side while in a flat bed without using bedrails?: None Help needed moving from lying on your back to sitting on the side of a flat bed without using bedrails?: None Help needed moving to and from a bed to a chair (including a wheelchair)?: None Help needed standing up from a chair using your arms (e.g., wheelchair or bedside chair)?: None Help needed to walk in hospital room?: A Little Help  needed climbing 3-5 steps with a railing? : A Little 6 Click Score: 22    End of Session Equipment Utilized During Treatment: Gait belt Activity Tolerance: Patient tolerated treatment well Patient left: in bed;with nursing/sitter in room;with call bell/phone within reach Nurse Communication: Mobility status PT Visit Diagnosis: Difficulty in walking, not elsewhere classified (R26.2)    Time: 8869-8775 PT Time Calculation (min) (ACUTE ONLY): 54 min   Charges:   PT Evaluation $PT Eval Low Complexity: 1 Low PT Treatments $Gait Training: 23-37 mins $Therapeutic Activity: 8-22 mins PT General Charges $$ ACUTE PT VISIT: 1 Visit         [facility] Acute Rehabilitation Services Office: [phone] 05/25/2024   Stann DELENA Ohara 05/25/2024, 12:37 PM

## 2024-05-25 NOTE — Inpatient Diabetes Management (Signed)
 Inpatient Diabetes Program Recommendations  AACE/ADA: New Consensus Statement on Inpatient Glycemic Control (2015)  Target Ranges:  Prepandial:   less than 140 mg/dL      Peak postprandial:   less than 180 mg/dL (1-2 hours)      Critically ill patients:  140 - 180 mg/dL   Lab Results  Component Value Date   GLUCAP 284 (H) 05/25/2024   HGBA1C 7.2 (H) 03/15/2024    Review of Glycemic Control  Diabetes history: DM2 Outpatient Diabetes medications: Humalog  10-10-8 TID, metformin  500 QAM, OmniPod Current orders for Inpatient glycemic control: Semglee  15 daily, Novolog  0-24 TID Solumedrol 125 mg BID   HgbA1C - 7.2%  Inpatient Diabetes Program Recommendations:   Change Novolog  to 0-20 TID with meals and 0-5 HS  Add Novolog  5 units TID with meals if eating > 50%  If FBS > 180 mg/dL, increase Semglee  to 18 units daily   Spoke with pt at bedside regarding her diabetes and HgbA1C of 7.2%. Good glycemic control prior to admission. Has OmniPod and uses Novolog . Has a hard time with controlling blood sugars while on steroids. Pt states she may want to leave off OmniPod and just do basal-bolus at home. Asked about [facility] in [place] and gave her 2 names at [employer]. Discussed food choices, fiber, hypoglycemia s/s and treatment.  Titrate meal coverage with steroids on board.  Follow.   Thank you. Shona Brandy, RD, LDN, CDCES Inpatient Diabetes Coordinator [phone]

## 2024-05-26 DIAGNOSIS — I2699 Other pulmonary embolism without acute cor pulmonale: Secondary | ICD-10-CM | POA: Diagnosis not present

## 2024-05-26 DIAGNOSIS — J679 Hypersensitivity pneumonitis due to unspecified organic dust: Secondary | ICD-10-CM | POA: Diagnosis not present

## 2024-05-26 DIAGNOSIS — J8410 Pulmonary fibrosis, unspecified: Secondary | ICD-10-CM

## 2024-05-26 DIAGNOSIS — Z794 Long term (current) use of insulin: Secondary | ICD-10-CM | POA: Diagnosis not present

## 2024-05-26 DIAGNOSIS — K219 Gastro-esophageal reflux disease without esophagitis: Secondary | ICD-10-CM | POA: Diagnosis not present

## 2024-05-26 DIAGNOSIS — E119 Type 2 diabetes mellitus without complications: Secondary | ICD-10-CM | POA: Diagnosis not present

## 2024-05-26 DIAGNOSIS — J9621 Acute and chronic respiratory failure with hypoxia: Secondary | ICD-10-CM | POA: Diagnosis not present

## 2024-05-26 DIAGNOSIS — J9601 Acute respiratory failure with hypoxia: Secondary | ICD-10-CM | POA: Diagnosis not present

## 2024-05-26 DIAGNOSIS — J849 Interstitial pulmonary disease, unspecified: Secondary | ICD-10-CM | POA: Diagnosis not present

## 2024-05-26 LAB — CBC
HCT: 40.8 % (ref 36.0–46.0)
Hemoglobin: 12.5 g/dL (ref 12.0–15.0)
MCH: 27.8 pg (ref 26.0–34.0)
MCHC: 30.6 g/dL (ref 30.0–36.0)
MCV: 90.9 fL (ref 80.0–100.0)
Platelets: 182 K/uL (ref 150–400)
RBC: 4.49 MIL/uL (ref 3.87–5.11)
RDW: 18.4 % — ABNORMAL HIGH (ref 11.5–15.5)
WBC: 16.2 K/uL — ABNORMAL HIGH (ref 4.0–10.5)
nRBC: 0.1 % (ref 0.0–0.2)

## 2024-05-26 LAB — CULTURE, BAL-QUANTITATIVE W GRAM STAIN
Culture: NO GROWTH
Culture: NO GROWTH

## 2024-05-26 LAB — GLUCOSE, CAPILLARY
Glucose-Capillary: 225 mg/dL — ABNORMAL HIGH (ref 70–99)
Glucose-Capillary: 320 mg/dL — ABNORMAL HIGH (ref 70–99)
Glucose-Capillary: 197 mg/dL — ABNORMAL HIGH (ref 70–99)
Glucose-Capillary: 131 mg/dL — ABNORMAL HIGH (ref 70–99)

## 2024-05-26 LAB — RENAL FUNCTION PANEL
Albumin: 2.8 g/dL — ABNORMAL LOW (ref 3.5–5.0)
Anion gap: 11 (ref 5–15)
BUN: 30 mg/dL — ABNORMAL HIGH (ref 8–23)
CO2: 25 mmol/L (ref 22–32)
Calcium: 8.5 mg/dL — ABNORMAL LOW (ref 8.9–10.3)
Chloride: 101 mmol/L (ref 98–111)
Creatinine, Ser: 0.67 mg/dL (ref 0.44–1.00)
GFR, Estimated: >60 mL/min (ref >60)
Glucose, Bld: 217 mg/dL — ABNORMAL HIGH (ref 70–99)
Phosphorus: 3.3 mg/dL (ref 2.5–4.6)
Potassium: 4.3 mmol/L (ref 3.5–5.1)
Sodium: 137 mmol/L (ref 135–145)

## 2024-05-26 MED ORDER — PANTOPRAZOLE SODIUM 40 MG PO TBEC
40.0000 mg | DELAYED_RELEASE_TABLET | Freq: Every day | ORAL | Status: DC
  Administered 2024-05-26 – 2024-05-30 (×4): 40 mg via ORAL
  Filled 2024-05-26 (×4): qty 1

## 2024-05-26 NOTE — Progress Notes (Signed)
 SATURATION QUALIFICATIONS: (This note is used to comply with regulatory documentation for home oxygen )  Patient Saturations on Room Air at Rest = 95%  Patient Saturations on Room Air while Ambulating = 85%  Patient Saturations on 2 Liters of oxygen  while Ambulating = 97%  Please briefly explain why patient needs home oxygen : Desaturation with ambulation / mobility. Of note: pt ambulated on RA 85-95%, 1L 88-97%, and 2L remained >92%.  Elsie Grieves, PT, DPT [facility] Department Office: [phone]

## 2024-05-26 NOTE — Progress Notes (Signed)
 PROGRESS NOTE Rhonda [name]  FMW:[id number] DOB: 12/22/1950 DOA: 05/22/2024 PCP: Geronimo Amel, MD  Brief Narrative/Hospital Course: 73 y.o. female with interstitial lung disease, CAD, GERD, HTN, HLP chronic hypersensitive pneumonitis on CellCept  prednisone , presented to ED due to shortness of breath,. In ED afebrile hemodynamically stable.  PCCM consulted and admitted, Labs were obtained which showed glucose 234, creat 0.89, WBC 13.5, hemoglobin 13.7, troponin within normal limits, BNP 89, respiratory viral panel negative.  Lactic acid 2.1.  CXR>no acute infiltrates. CTA> Small bilateral Pes-heparin  drip started S/P bronchoscopy on 7/1.  7/4: Hemodynamically stable.  Developed bleeding per rectum, had 2 episodes this morning.  GI was consulted and holding Eliquis .  Hemoglobin decreased to 12.5 from 13.2 today. Received rituximab  infusion yesterday.  Subjective: Patient was seen and examined today.  She was complaining of having bleeding per rectum, 1 small episode and 1 with significant amount of bleeding.  No pain.  No dark-colored stools.  Assessment and plan:  AoCHRF ILD flareup  Small bilateral PE-low clot burden-dvt study neg Chronic hypersensitive pneumonitis: Patient on chronic steroids and CellCept  at baseline presented with shortness of breath, CT imaging shows small bilateral PE, concern for ILD flareup PCCM following.  S/p bronchoscopy 7/1, follow BAL studies. F/u culture, cytology.Cell count with monocyte, macrophage predominance.  Managing with anticoagulation, now on Eliquis , on antibiotic with ceftriaxone , azithromycin , Solu-Medrol  125 mg twice daily and CellCept  Bactrim  bronchodilators/nebs.  Noted plan to increase CellCept , antibiotics x 5 possibly adding IV Rituxan  versus CellCept , received Rituxan  yesterday Continue supple oxygen  to keep above 92%, continue plan of care as per pulmonary CT chest abdomen pelvis-7/2 no lymphadenopathy or metastatic disease Cont to  Increase mobility with PT, mobilize  Bleeding per rectum.  Patient started having some bleeding per rectum, small decrease in hemoglobin to 12.5 from 13.2.  Patient was recently started on Eliquis . - Holding Eliquis  today -GI was consulted   Diabetes mellitus type 2, uncontrolled with hyperglycemia: Uncontrolled in the setting of steroid A1c 7.2 on Semglee  15 units daily, SSI, monitor and adjust   Hyperlipidemia Continue Zetia , statin   Hypertension Stable, continue diltiazem , Toprol  XL   Vision changes with blurriness: Possibly due to hyperglycemia with steroid use ophthalmology evaluation rule out diabetic retinopathy.  Monitor   DVT prophylaxis: SCDs Start: 05/22/24 1756 Code Status:   Code Status: Full Code Family Communication: Discussed with patient and husband at bedside.  Patient status is: Remains hospitalized because of severity of illness Level of care: Telemetry   Dispo: The patient is from: home            Anticipated disposition: TBD  Objective: Vitals last 24 hrs: Vitals:   05/25/24 1944 05/26/24 0518 05/26/24 0857 05/26/24 1240  BP: (!) 152/67 (!) 143/71  (!) 142/64  Pulse: 76 80  83  Resp: 15 15  17   Temp: 97.6 F (36.4 C) (!) 97.4 F (36.3 C)  98.5 F (36.9 C)  TempSrc:    Oral  SpO2: 94% 98% 97% 100%  Weight:      Height:        Physical Examination: General.  Well-developed elderly lady, in no acute distress. Pulmonary.  Coarse breath sounds bilaterally, normal respiratory effort. CV.  Regular rate and rhythm, no JVD, rub or murmur. Abdomen.  Soft, nontender, nondistended, BS positive. CNS.  Alert and oriented .  No focal neurologic deficit. Extremities.  No edema,  pulses intact and symmetrical. Psychiatry.  Judgment and insight appears normal.    Data Reviewed: I  have personally reviewed following labs and imaging studies ( see epic result tab) CBC: Recent Labs  Lab 05/22/24 1301 05/23/24 0542 05/24/24 0028 05/25/24 0527  05/26/24 1045  WBC 15.5* 16.5* 20.3* 20.3* 16.2*  HGB 13.7 13.6 12.5 13.1 12.5  HCT 44.8 44.2 40.6 42.7 40.8  MCV 90.5 91.1 90.2 91.6 90.9  PLT 238 199 182 195 182   CMP: Recent Labs  Lab 05/22/24 1301 05/24/24 0028 05/25/24 0527 05/26/24 0337  NA 137 139 137 137  K 4.2 4.0 4.3 4.3  CL 106 106 103 101  CO2 21* 24 27 25   GLUCOSE 234* 164* 228* 217*  BUN 36* 26* 27* 30*  CREATININE 0.89 1.08* 0.91 0.67  CALCIUM  9.1 8.5* 8.8* 8.5*  PHOS  --   --  3.9 3.3   GFR: Estimated Creatinine Clearance: 50.6 mL/min (by C-G formula based on SCr of 0.67 mg/dL). Recent Labs  Lab 05/25/24 0527 05/26/24 0337  ALBUMIN 3.1* 2.8*   No results for input(s): LIPASE, AMYLASE in the last 168 hours. No results for input(s): AMMONIA in the last 168 hours. Coagulation Profile: No results for input(s): INR, PROTIME in the last 168 hours. Unresulted Labs (From admission, onward)     Start     Ordered   05/25/24 0500  Renal function panel  Daily,   R     Question:  Specimen collection method  Answer:  Lab=Lab collect   05/24/24 0558   05/23/24 1423  Fungus Culture With Stain  RELEASE UPON ORDERING,   TIMED       Comments: Specimen A: Specimen Description RUL BAL Phone [phone] Immunocompromised?  No  Antibiotic Treatment:  none Is the patient on airborne/droplet precautions? No Clinical History:  N/A Special Instructions:  none Specimen Disposition:  Microbiology, cytology, cell count     05/23/24 1423   05/23/24 1423  Fungus Culture With Stain  RELEASE UPON ORDERING,   TIMED       Comments: Specimen B: Specimen Description LUL BAL Phone [phone] Immunocompromised?  No  Antibiotic Treatment:  none Is the patient on airborne/droplet precautions? No Clinical History:  N/A Special Instructions:  none Specimen Disposition:  Microbiology, cytology, cell count     05/23/24 1423   05/23/24 1423  Acid Fast Culture with reflexed sensitivities  RELEASE UPON ORDERING,   TIMED        Comments: Specimen A: Specimen Description RUL BAL Phone [phone] Immunocompromised?  No  Antibiotic Treatment:  none Is the patient on airborne/droplet precautions? No Clinical History:  N/A Special Instructions:  none Specimen Disposition:  Microbiology, cytology, cell count     05/23/24 1423   05/23/24 1423  Acid Fast Culture with reflexed sensitivities  RELEASE UPON ORDERING,   TIMED       Comments: Specimen B: Specimen Description LUL BAL Phone [phone] Immunocompromised?  No  Antibiotic Treatment:  none Is the patient on airborne/droplet precautions? No Clinical History:  N/A Special Instructions:  none Specimen Disposition:  Microbiology, cytology, cell count     05/23/24 1423           Antimicrobials/Microbiology: Anti-infectives (From admission, onward)    Start     Dose/Rate Route Frequency Ordered Stop   05/25/24 0800  azithromycin  (ZITHROMAX ) tablet 500 mg        500 mg Oral Daily 05/24/24 1226     05/24/24 0900  sulfamethoxazole -trimethoprim  (BACTRIM  DS) 800-160 MG per tablet 1 tablet        1 tablet Oral Once per  day on Monday Wednesday Friday 05/22/24 2234     05/23/24 1000  sulfamethoxazole -trimethoprim  (BACTRIM  DS) 800-160 MG per tablet 1 tablet  Status:  Discontinued        1 tablet Oral Daily 05/22/24 2229 05/22/24 2234   05/22/24 1630  cefTRIAXone  (ROCEPHIN ) 2 g in sodium chloride  0.9 % 100 mL IVPB        2 g 200 mL/hr over 30 Minutes Intravenous Every 12 hours 05/22/24 1557     05/22/24 1630  azithromycin  (ZITHROMAX ) 500 mg in sodium chloride  0.9 % 250 mL IVPB  Status:  Discontinued        500 mg 250 mL/hr over 60 Minutes Intravenous Daily 05/22/24 1557 05/24/24 1226         Component Value Date/Time   SDES  05/23/2024 1417    BRONCHIAL ALVEOLAR LAVAGE Performed at [facility], [address], [place], [place] [address]    SDES BRONCHIAL ALVEOLAR LAVAGE 05/23/2024 1417   SPECREQUEST LUL 05/23/2024 1417   SPECREQUEST  LUL 05/23/2024 1417   CULT  05/23/2024 1417    NO GROWTH 2 DAYS Performed at [facility], [address], [place], [place] [address]    CULT  05/23/2024 1417    NO ANAEROBES ISOLATED; CULTURE IN PROGRESS FOR 5 DAYS   REPTSTATUS 05/26/2024 FINAL 05/23/2024 1417   REPTSTATUS PENDING 05/23/2024 1417    Procedures: Procedure(s) (LRB): VIDEO BRONCHOSCOPY WITHOUT FLUORO (Bilateral) Medications reviewed:  Scheduled Meds:  apixaban   10 mg Oral BID   Followed by   NOREEN ON 05/31/2024] apixaban   5 mg Oral BID   arformoterol   15 mcg Nebulization BID   azithromycin   500 mg Oral Daily   budesonide   0.5 mg Nebulization BID   Chlorhexidine  Gluconate Cloth  6 each Topical Daily   diltiazem   120 mg Oral Daily   famotidine   20 mg Oral BID   insulin  aspart  0-24 Units Subcutaneous TID WC   insulin  glargine-yfgn  15 Units Subcutaneous Daily   methylPREDNISolone  (SOLU-MEDROL ) injection  125 mg Intravenous BID   metoprolol  succinate  25 mg Oral Daily   mycophenolate   1,000 mg Oral BID   pravastatin   40 mg Oral QPM   revefenacin   175 mcg Nebulization Daily   sodium chloride  flush  10-40 mL Intracatheter Q12H   sulfamethoxazole -trimethoprim   1 tablet Oral Once per day on Monday Wednesday Friday   Continuous Infusions:  cefTRIAXone  (ROCEPHIN )  IV 2 g (05/26/24 1001)   famotidine  (PEPCID ) IV     sodium chloride       Amaryllis Dare, MD [facility] 05/26/2024, 1:41 PM

## 2024-05-26 NOTE — Progress Notes (Signed)
 NAME:  Rhonda [name], MRN:  [id number], DOB:  1951/11/13, LOS: 4 ADMISSION DATE:  05/22/2024, CONSULTATION DATE:  05/22/2024 REFERRING MD:  Dr. Lenor-  EDP, CHIEF COMPLAINT:  Acute on chronic hypoxic respiratory failure    History of Present Illness:  Rhonda [name] is a 73 y.o. female with PMH significant for  hypersensitive pneumonitis related pulmonary fibrosis/interstitial lung disease immunosuppressed at baseline with chronic steroids, CAD, HTN, HLD, and steroids induce diabetes who presented to the ED at the direction of primary pulmonologist for complaints of hypoxic and change in sputum production.   Of note patient had had a progressive decline in respiratory status over that last year, with worst decline seen after suffering a rhinovirus infection in April. After this admission she was started on Prednisone  taper and CellCept , she tolerated this well until her steroids got to 10 of Prednisone  daily. At this time she began to desaturate quickly. He primary pulmonologist (Dr. Geronimo) wished to admit for further management but patient refused and decision was made to increase steroids back to 60mg  daily and symptoms resolved until 6/30 when hypoxic returned and sputum production changed.   Pertinent  Medical History  Hypersensitive pneumonitis related pulmonary fibrosis/interstitial lung disease immunosuppressed at baseline with chronic steroids, CAD, HTN, HLD, and steroids induce diabetes  Significant Hospital Events: Including procedures, antibiotic start and stop dates in addition to other pertinent events   6/30 Presented with more subjective hypoxia with and change in sputum production.  Started on heparin  for segmental PE 7/1 Bronchoscopy with BAL 7/2 lower extremity duplex with no DVT, CT chest abdomen pelvis with chronic interstitial changes.  No evidence of occult malignancy. 7/3 CellCept  increased to 1 g twice daily.  Given 1 g of Rituxan   Interim History / Subjective:    Given Rituxan  infusion yesterday and CellCept  dose increased Has bright red blood per rectum and Eliquis  held.  GI consulted  Objective    Blood pressure (!) 142/64, pulse 83, temperature 98.5 F (36.9 C), temperature source Oral, resp. rate 17, height 4' 11 (1.499 m), weight 67.1 kg, SpO2 100%.        Intake/Output Summary (Last 24 hours) at 05/26/2024 1345 Last data filed at 05/26/2024 0811 Gross per 24 hour  Intake 240 ml  Output --  Net 240 ml   Filed Weights   05/22/24 1220 05/23/24 0856 05/23/24 1309  Weight: 66.2 kg 68.2 kg 67.1 kg    Examination: Gen:      No acute distress HEENT:  EOMI, sclera anicteric Neck:     No masses; no thyromegaly Lungs:    B/L crackles CV:         Regular rate and rhythm; no murmurs Abd:      + bowel sounds; soft, non-tender; no palpable masses, no distension Ext:    No edema; adequate peripheral perfusion Neuro: alert and oriented x 3 Psych: normal mood and affect   BAL cell count with monocyte, macrophage predominance.  Cultures are negative to date, cytology are pending  Echocardiogram on 6/20 with LVEF 65-70%, grade 1 diastolic dysfunction.  Normal RV size and function, TAPSE 1.9.  No atrial level shunts noted by color-flow Doppler RHC on 5/13 with no pulmonary hypertension.  Normal right and left heart filling pressures Bubble study  Resolved problem list   Assessment and Plan  Acute on chronic hypoxic respiratory failure Chronic hypersensitivity pneumonitis  Chronic hypersensitivity pneumonitis, with recent exacerbation. Symptoms include decreased oxygen  saturation upon exertion. Previous treatments with doxycycline   and steroids were effective, but recent exacerbations have been resistant to multiple antibiotic and steroid cycles. Concerns about progression of interstitial lung disease the CT looks stable.  No evidence of pulmonary hypertension or RV dysfunction on echocardiogram 6/20 and right heart cath 5/13.  No atrial shunt.   Respiratory virus panel is negative and no evidence of infection by procalcitonin and negative BAL cultures to date  - Administer IV Solumedrol 125 mg twice daily.  Will change to prednisone  over the weekend.  Continue Bactrim  for prophylaxis - CellCept  dose increased and given Rituxan  infusion yesterday - Administer IV antibiotics for CAP coverage.  Can stop after 5 days - PT, out of bed, mobilize - Referral is pending for pulmonary [facility] red blood per rectum Eliquis  on hold, GI consult  Pulmonary embolism, low clot burden with no evidence of right heart strain Low clot burden is unlikely to explain all her symptoms.  Eliquis  on hold No lower extremity DVT.  CT chest abdomen pelvis reviewed with no evidence of occult malignancy  Insulin -dependent diabetes mellitus  Insulin -dependent diabetes mellitus secondary to chronic steroid use. Concerns about blood sugar management with increased steroid dosage. Hospitalization will require frequent monitoring and adjustment of insulin .  - Monitor blood sugar every four hours while hospitalized  - Adjust insulin  as needed    Vision changes > improved Vision changes characterized by blurriness and difficulty focusing. Ophthalmologist evaluation ruled out diabetic retinopathy. Possible association with fluctuating blood sugar levels and steroid use.  - Monitor vision changes in relation to blood sugar and steroid levels   Best Practice (right click and Reselect all SmartList Selections daily)   Per primary   Signature:   Quill Grinder MD [facility] See Amion for pager  If no response to pager , please call [phone] until 7pm After 7:00 pm call Elink  [phone] 05/26/2024, 1:45 PM

## 2024-05-26 NOTE — Progress Notes (Addendum)
 Physical Therapy Treatment Patient Details Name: Rhonda [name] MRN: [id number] DOB: [birth year] Today's Date: 05/26/2024   History of Present Illness Rhonda [name] is a 73 y.o. female admitted 05/22/24 with medical history significant of interstitial lung disease, CAD, GERD, hyperlipidemia, hypertension who presented to the emergency department due to shortness of breath.    PT Comments  Pt received seated in recliner on 4LO2 via [place]; reduced to RA per order for walking O2 saturation test. Pt demonstrated mod IND with transfers without AD, with SPC, and RW; recommending RW at this time for increased stability. See walking O2 tests for details, recommending 2LO2 via [place] at this time, pt on 2LO2 at exit and saturation 97%. Pt ambulated 584ft total (250 with RW and Sup; 246ft with Spc and SUP). During session, pt reporting reflux and HA, reported to RN.  We will continue to follow acutely.     If plan is discharge home, recommend the following: Help with stairs or ramp for entrance;A little help with bathing/dressing/bathroom;Assistance with cooking/housework   Can travel by private vehicle        Equipment Recommendations  None recommended by PT    Recommendations for Other Services       Precautions / Restrictions Precautions Precautions: Fall Recall of Precautions/Restrictions: Intact Restrictions Weight Bearing Restrictions Per Provider Order: No     Mobility  Bed Mobility Overal bed mobility: Modified Independent             General bed mobility comments: In recliner at start and end of session    Transfers Overall transfer level: Modified independent Equipment used: Rolling walker (2 wheels), Straight cane, None               General transfer comment: STS with RW and STS with SPC as well as without AD, IND    Ambulation/Gait Ambulation/Gait assistance: Supervision Gait Distance (Feet): 500 Feet Assistive device: Rolling walker (2 wheels), Straight  cane Gait Pattern/deviations: WFL(Within Functional Limits) Gait velocity: dec     General Gait Details: Pt utilized RW for ~280ft, then SPC for ~262ft, no increase in instability or sway, pt with good 3-point gait pattern; O2 monitored with amb on RA vs 1L, pt desaturated on RA to 85% despite recovery period and PLB, added 1L, pt ambulated 261ft with SPC, then desaturated to 88%, responded to 97% on 2L.   Stairs             Wheelchair Mobility     Tilt Bed    Modified Rankin (Stroke Patients Only)       Balance Overall balance assessment: Mild deficits observed, not formally tested                                          Communication Communication Communication: No apparent difficulties  Cognition Arousal: Alert Behavior During Therapy: WFL for tasks assessed/performed   PT - Cognitive impairments: No apparent impairments                         Following commands: Intact      Cueing Cueing Techniques: Verbal cues, Gestural cues  Exercises      General Comments        Pertinent Vitals/Pain Pain Assessment Pain Assessment: No/denies pain    Home Living  Prior Function            PT Goals (current goals can now be found in the care plan section) Acute Rehab PT Goals Patient Stated Goal: improve activity tolerance and O2 sats with activity PT Goal Formulation: With patient Time For Goal Achievement: 06/08/24 Potential to Achieve Goals: Good Progress towards PT goals: Progressing toward goals    Frequency    Min 2X/week      PT Plan      Co-evaluation              AM-PAC PT 6 Clicks Mobility   Outcome Measure  Help needed turning from your back to your side while in a flat bed without using bedrails?: None Help needed moving from lying on your back to sitting on the side of a flat bed without using bedrails?: None Help needed moving to and from a bed to a chair  (including a wheelchair)?: None Help needed standing up from a chair using your arms (e.g., wheelchair or bedside chair)?: None Help needed to walk in hospital room?: A Little Help needed climbing 3-5 steps with a railing? : A Little 6 Click Score: 22    End of Session Equipment Utilized During Treatment: Gait belt Activity Tolerance: Patient tolerated treatment well;No increased pain Patient left: with call bell/phone within reach;in chair Nurse Communication: Mobility status PT Visit Diagnosis: Difficulty in walking, not elsewhere classified (R26.2)     Time: 8492-8468 PT Time Calculation (min) (ACUTE ONLY): 24 min  Charges:    $Gait Training: 8-22 mins PT General Charges $$ ACUTE PT VISIT: 1 Visit                     Elsie Grieves, PT, DPT [facility] Office: [phone]   Elsie Grieves 05/26/2024, 3:42 PM

## 2024-05-27 ENCOUNTER — Encounter (HOSPITAL_COMMUNITY): Payer: Self-pay | Admitting: Internal Medicine

## 2024-05-27 DIAGNOSIS — I2699 Other pulmonary embolism without acute cor pulmonale: Secondary | ICD-10-CM | POA: Diagnosis not present

## 2024-05-27 DIAGNOSIS — J849 Interstitial pulmonary disease, unspecified: Secondary | ICD-10-CM | POA: Diagnosis not present

## 2024-05-27 DIAGNOSIS — J9621 Acute and chronic respiratory failure with hypoxia: Secondary | ICD-10-CM | POA: Diagnosis not present

## 2024-05-27 DIAGNOSIS — K602 Anal fissure, unspecified: Secondary | ICD-10-CM

## 2024-05-27 DIAGNOSIS — K648 Other hemorrhoids: Secondary | ICD-10-CM

## 2024-05-27 DIAGNOSIS — K625 Hemorrhage of anus and rectum: Secondary | ICD-10-CM

## 2024-05-27 DIAGNOSIS — Z794 Long term (current) use of insulin: Secondary | ICD-10-CM | POA: Diagnosis not present

## 2024-05-27 DIAGNOSIS — J679 Hypersensitivity pneumonitis due to unspecified organic dust: Secondary | ICD-10-CM | POA: Diagnosis not present

## 2024-05-27 DIAGNOSIS — E119 Type 2 diabetes mellitus without complications: Secondary | ICD-10-CM | POA: Diagnosis not present

## 2024-05-27 DIAGNOSIS — K649 Unspecified hemorrhoids: Secondary | ICD-10-CM | POA: Insufficient documentation

## 2024-05-27 DIAGNOSIS — K219 Gastro-esophageal reflux disease without esophagitis: Secondary | ICD-10-CM | POA: Diagnosis not present

## 2024-05-27 DIAGNOSIS — J9601 Acute respiratory failure with hypoxia: Secondary | ICD-10-CM | POA: Diagnosis not present

## 2024-05-27 LAB — CBC
HCT: 45 % (ref 36.0–46.0)
Hemoglobin: 13.8 g/dL (ref 12.0–15.0)
MCH: 27.8 pg (ref 26.0–34.0)
MCHC: 30.7 g/dL (ref 30.0–36.0)
MCV: 90.7 fL (ref 80.0–100.0)
Platelets: 197 K/uL (ref 150–400)
RBC: 4.96 MIL/uL (ref 3.87–5.11)
RDW: 18.5 % — ABNORMAL HIGH (ref 11.5–15.5)
WBC: 17.7 K/uL — ABNORMAL HIGH (ref 4.0–10.5)
nRBC: 0 % (ref 0.0–0.2)

## 2024-05-27 LAB — RENAL FUNCTION PANEL
Albumin: 3.1 g/dL — ABNORMAL LOW (ref 3.5–5.0)
Anion gap: 12 (ref 5–15)
BUN: 25 mg/dL — ABNORMAL HIGH (ref 8–23)
CO2: 28 mmol/L (ref 22–32)
Calcium: 8.8 mg/dL — ABNORMAL LOW (ref 8.9–10.3)
Chloride: 99 mmol/L (ref 98–111)
Creatinine, Ser: 0.93 mg/dL (ref 0.44–1.00)
GFR, Estimated: >60 mL/min (ref >60)
Glucose, Bld: 177 mg/dL — ABNORMAL HIGH (ref 70–99)
Phosphorus: 4.3 mg/dL (ref 2.5–4.6)
Potassium: 3.9 mmol/L (ref 3.5–5.1)
Sodium: 139 mmol/L (ref 135–145)

## 2024-05-27 LAB — CULTURE, BLOOD (ROUTINE X 2)
Culture: NO GROWTH
Culture: NO GROWTH

## 2024-05-27 LAB — GLUCOSE, CAPILLARY
Glucose-Capillary: 214 mg/dL — ABNORMAL HIGH (ref 70–99)
Glucose-Capillary: 239 mg/dL — ABNORMAL HIGH (ref 70–99)
Glucose-Capillary: 120 mg/dL — ABNORMAL HIGH (ref 70–99)
Glucose-Capillary: 169 mg/dL — ABNORMAL HIGH (ref 70–99)

## 2024-05-27 MED ORDER — APIXABAN 5 MG PO TABS: 5.0000 mg | ORAL_TABLET | Freq: Two times a day (BID) | ORAL | Status: DC

## 2024-05-27 MED ORDER — DICYCLOMINE HCL 10 MG PO CAPS
10.0000 mg | ORAL_CAPSULE | Freq: Two times a day (BID) | ORAL | Status: DC
  Administered 2024-05-27 – 2024-05-30 (×5): 10 mg via ORAL
  Filled 2024-05-27 (×5): qty 1

## 2024-05-27 MED ORDER — NA SULFATE-K SULFATE-MG SULF 17.5-3.13-1.6 GM/177ML PO SOLN
1.0000 | Freq: Once | ORAL | Status: AC
  Administered 2024-05-27: 354 mL via ORAL
  Filled 2024-05-27: qty 1

## 2024-05-27 MED ORDER — SODIUM CHLORIDE 0.9 % IV SOLN: INTRAVENOUS | Status: AC

## 2024-05-27 NOTE — Anesthesia Preprocedure Evaluation (Signed)
 Anesthesia Evaluation  Patient identified by MRN, date of birth, ID band Patient awake    Reviewed: Allergy & Precautions, NPO status , Patient's Chart, lab work & pertinent test results, reviewed documented beta blocker date and time   History of Anesthesia Complications (+) PONV and history of anesthetic complications  Airway Mallampati: III  TM Distance: >3 FB Neck ROM: Full    Dental no notable dental hx. (+) Teeth Intact, Dental Advisory Given   Pulmonary asthma , pneumonia, resolved, PE (on heparin  gtt) ILD, on home oxygen  4L- on Cellcept , steroid dependent Recent bilateral PE's- started on Eliquis    Pulmonary exam normal breath sounds clear to auscultation       Cardiovascular hypertension, Pt. on medications and Pt. on home beta blockers + CAD and + DOE  Normal cardiovascular exam Rhythm:Regular Rate:Normal  TTE 2024 1. Left ventricular ejection fraction, by estimation, is 65 to 70%. The  left ventricle has normal function. The left ventricle has no regional  wall motion abnormalities. Left ventricular diastolic parameters are  indeterminate.   2. Right ventricular systolic function is normal. The right ventricular  size is mildly enlarged. There is mildly elevated pulmonary artery  systolic pressure. The estimated right ventricular systolic pressure is  36.6 mmHg.   3. Left atrial size was mild to moderately dilated.   4. Right atrial size was mildly dilated.   5. The mitral valve is normal in structure. Trivial mitral valve  regurgitation. No evidence of mitral stenosis.   6. The aortic valve is tricuspid. There is mild calcification of the  aortic valve. Aortic valve regurgitation is mild. No aortic stenosis is  present.   7. The inferior vena cava is normal in size with greater than 50%  respiratory variability, suggesting right atrial pressure of 3 mmHg.   Cath 2025 1. Normal right and left heart filling  pressures. 2. No pulmonary hypertension (mean PA pressure 19).  3. There is a mild step-up in oxygen  saturation from SVC to PA.  I do not think that a significant R => L shunt is present with normal filling pressures.  However, could consider a bubble study with echo in future.      Neuro/Psych  PSYCHIATRIC DISORDERS  Depression    negative neurological ROS     GI/Hepatic Neg liver ROS, hiatal hernia,GERD  Medicated,,Rectal bleeding   Endo/Other  diabetes, Poorly Controlled, Type 2, Insulin  Dependent  HLD  Renal/GU negative Renal ROS  negative genitourinary   Musculoskeletal  (+) Arthritis , Osteoarthritis,    Abdominal   Peds  Hematology  (+) Blood dyscrasia Eliquis  therapy- last dose 7/4   Anesthesia Other Findings   Reproductive/Obstetrics                              Anesthesia Physical Anesthesia Plan  ASA: 3 and emergent  Anesthesia Plan: MAC   Post-op Pain Management: Minimal or no pain anticipated   Induction: Intravenous  PONV Risk Score and Plan: 4 or greater and Treatment may vary due to age or medical condition and Propofol  infusion  Airway Management Planned: Natural Airway  Additional Equipment: None  Intra-op Plan:   Post-operative Plan: Extubation in OR  Informed Consent: I have reviewed the patients History and Physical, chart, labs and discussed the procedure including the risks, benefits and alternatives for the proposed anesthesia with the patient or authorized representative who has indicated his/her understanding and acceptance.  Dental advisory given  Plan Discussed with: CRNA and Anesthesiologist  Anesthesia Plan Comments:         Anesthesia Quick Evaluation

## 2024-05-27 NOTE — Plan of Care (Signed)

## 2024-05-27 NOTE — Progress Notes (Signed)
 PROGRESS NOTE Rhonda [name]  FMW:[id number] DOB: 04/20/1951 DOA: 05/22/2024 PCP: Geronimo Amel, MD  Brief Narrative/Hospital Course: 73 y.o. female with interstitial lung disease, CAD, GERD, HTN, HLP chronic hypersensitive pneumonitis on CellCept  prednisone , presented to ED due to shortness of breath,. In ED afebrile hemodynamically stable.  PCCM consulted and admitted, Labs were obtained which showed glucose 234, creat 0.89, WBC 13.5, hemoglobin 13.7, troponin within normal limits, BNP 89, respiratory viral panel negative.  Lactic acid 2.1.  CXR>no acute infiltrates. CTA> Small bilateral Pes-heparin  drip started S/P bronchoscopy on 7/1.  7/4: Hemodynamically stable.  Developed bleeding per rectum, had 2 episodes this morning.  GI was consulted and holding Eliquis .  Hemoglobin decreased to 12.5 from 13.2 today. Received rituximab  infusion yesterday.  7/5: Hemodynamically stable, some improvement of hemoglobin to 13.8 but she continued to have bleeding per rectum.  GI is planning colonoscopy tomorrow morning.  We will keep holding Eliquis  until cleared from GI.  Subjective: Patient was still having some bleeding per rectum, she had some stored pictures of sanitary pads with fresh blood.  She was also having some diarrhea.  Has an history of hemorrhoid.  Assessment and plan:  Ao[facility] ILD flareup  Small bilateral PE-low clot burden-dvt study neg Chronic hypersensitive pneumonitis: Patient on chronic steroids and CellCept  at baseline presented with shortness of breath, CT imaging shows small bilateral PE, concern for ILD flareup PCCM following.  S/p bronchoscopy 7/1, follow BAL studies. F/u culture, cytology.Cell count with monocyte, macrophage predominance. antibiotic with ceftriaxone , azithromycin ,  Received high-dose Solu-Medrol  and rituximab  Continuing steroid Pulmonary is on board CellCept  dose was increased and continuing Bactrim  Continue supple oxygen  to keep above 92%, continue  plan of care as per pulmonary CT chest abdomen pelvis-7/2 no lymphadenopathy or metastatic disease Cont to Increase mobility with PT, mobilize  Bleeding per rectum.  Patient continued to have some bleeding per rectum, small decrease in hemoglobin to 12.5 from 13.2 initially and now seems stable at 13.8.  Patient was recently started on Eliquis . - Holding Eliquis   -GI was consulted-planning colonoscopy tomorrow -Keep holding Eliquis  until cleared from GI   Diabetes mellitus type 2, uncontrolled with hyperglycemia: Uncontrolled in the setting of steroid A1c 7.2 on Semglee  15 units daily, SSI, monitor and adjust   Hyperlipidemia Continue Zetia , statin   Hypertension Stable, continue diltiazem , Toprol  XL   Vision changes with blurriness: Possibly due to hyperglycemia with steroid use ophthalmology evaluation rule out diabetic retinopathy.  Monitor   DVT prophylaxis: SCDs Start: 05/22/24 1756 Code Status:   Code Status: Full Code Family Communication: Discussed with patient   Patient status is: Remains hospitalized because of severity of illness Level of care: Telemetry   Dispo: The patient is from: home            Anticipated disposition: TBD  Objective: Vitals last 24 hrs: Vitals:   05/26/24 2143 05/26/24 2148 05/27/24 0516 05/27/24 0842  BP:   122/75   Pulse:   73   Resp:   20   Temp:   98 F (36.7 C)   TempSrc:      SpO2: 97% 97% 98% 99%  Weight:      Height:        Physical Examination: General.  Well-developed elderly lady, in no acute distress. Pulmonary.  Lungs clear bilaterally, normal respiratory effort. CV.  Regular rate and rhythm, no JVD, rub or murmur. Abdomen.  Soft, nontender, nondistended, BS positive. CNS.  Alert and oriented .  No focal neurologic deficit.  Extremities.  No edema, no cyanosis, pulses intact and symmetrical. Psychiatry.  Judgment and insight appears normal.     Data Reviewed: I have personally reviewed following labs and imaging  studies ( see epic result tab) CBC: Recent Labs  Lab 05/23/24 0542 05/24/24 0028 05/25/24 0527 05/26/24 1045 05/27/24 0632  WBC 16.5* 20.3* 20.3* 16.2* 17.7*  HGB 13.6 12.5 13.1 12.5 13.8  HCT 44.2 40.6 42.7 40.8 45.0  MCV 91.1 90.2 91.6 90.9 90.7  PLT 199 182 195 182 197   CMP: Recent Labs  Lab 05/22/24 1301 05/24/24 0028 05/25/24 0527 05/26/24 0337 05/27/24 0632  NA 137 139 137 137 139  K 4.2 4.0 4.3 4.3 3.9  CL 106 106 103 101 99  CO2 21* 24 27 25 28   GLUCOSE 234* 164* 228* 217* 177*  BUN 36* 26* 27* 30* 25*  CREATININE 0.89 1.08* 0.91 0.67 0.93  CALCIUM  9.1 8.5* 8.8* 8.5* 8.8*  PHOS  --   --  3.9 3.3 4.3   GFR: Estimated Creatinine Clearance: 43.6 mL/min (by C-G formula based on SCr of 0.93 mg/dL). Recent Labs  Lab 05/25/24 0527 05/26/24 0337 05/27/24 9367  ALBUMIN 3.1* 2.8* 3.1*   No results for input(s): LIPASE, AMYLASE in the last 168 hours. No results for input(s): AMMONIA in the last 168 hours. Coagulation Profile: No results for input(s): INR, PROTIME in the last 168 hours. Unresulted Labs (From admission, onward)     Start     Ordered   05/25/24 0500  Renal function panel  Daily,   R     Question:  Specimen collection method  Answer:  Lab=Lab collect   05/24/24 0558   05/23/24 1423  Acid Fast Culture with reflexed sensitivities  RELEASE UPON ORDERING,   TIMED       Comments: Specimen A: Specimen Description RUL BAL Phone [phone] Immunocompromised?  No  Antibiotic Treatment:  none Is the patient on airborne/droplet precautions? No Clinical History:  N/A Special Instructions:  none Specimen Disposition:  Microbiology, cytology, cell count     05/23/24 1423   05/23/24 1423  Acid Fast Culture with reflexed sensitivities  RELEASE UPON ORDERING,   TIMED       Comments: Specimen B: Specimen Description LUL BAL Phone [phone] Immunocompromised?  No  Antibiotic Treatment:  none Is the patient on airborne/droplet precautions? No Clinical  History:  N/A Special Instructions:  none Specimen Disposition:  Microbiology, cytology, cell count     05/23/24 1423           Antimicrobials/Microbiology: Anti-infectives (From admission, onward)    Start     Dose/Rate Route Frequency Ordered Stop   05/25/24 0800  azithromycin  (ZITHROMAX ) tablet 500 mg        500 mg Oral Daily 05/24/24 1226     05/24/24 0900  sulfamethoxazole -trimethoprim  (BACTRIM  DS) 800-160 MG per tablet 1 tablet        1 tablet Oral Once per day on Monday Wednesday Friday 05/22/24 2234     05/23/24 1000  sulfamethoxazole -trimethoprim  (BACTRIM  DS) 800-160 MG per tablet 1 tablet  Status:  Discontinued        1 tablet Oral Daily 05/22/24 2229 05/22/24 2234   05/22/24 1630  cefTRIAXone  (ROCEPHIN ) 2 g in sodium chloride  0.9 % 100 mL IVPB        2 g 200 mL/hr over 30 Minutes Intravenous Every 12 hours 05/22/24 1557     05/22/24 1630  azithromycin  (ZITHROMAX ) 500 mg in sodium chloride  0.9 % 250  mL IVPB  Status:  Discontinued        500 mg 250 mL/hr over 60 Minutes Intravenous Daily 05/22/24 1557 05/24/24 1226         Component Value Date/Time   SDES  05/23/2024 1417    BRONCHIAL ALVEOLAR LAVAGE Performed at [facility], [address], [place], [place] [address]    SDES BRONCHIAL ALVEOLAR LAVAGE 05/23/2024 1417   SPECREQUEST LUL 05/23/2024 1417   SPECREQUEST LUL 05/23/2024 1417   CULT  05/23/2024 1417    NO GROWTH 2 DAYS Performed at [facility], [address] [place], [place] [address]    CULT  05/23/2024 1417    NO ANAEROBES ISOLATED; CULTURE IN PROGRESS FOR 5 DAYS   REPTSTATUS 05/26/2024 FINAL 05/23/2024 1417   REPTSTATUS PENDING 05/23/2024 1417    Procedures: Procedure(s) (LRB): VIDEO BRONCHOSCOPY WITHOUT FLUORO (Bilateral) Medications reviewed:  Scheduled Meds:  arformoterol   15 mcg Nebulization BID   azithromycin   500 mg Oral Daily   budesonide   0.5 mg Nebulization BID   Chlorhexidine  Gluconate Cloth  6 each  Topical Daily   dicyclomine   10 mg Oral BID AC   diltiazem   120 mg Oral Daily   famotidine   20 mg Oral BID   insulin  aspart  0-24 Units Subcutaneous TID WC   insulin  glargine-yfgn  15 Units Subcutaneous Daily   methylPREDNISolone  (SOLU-MEDROL ) injection  125 mg Intravenous BID   metoprolol  succinate  25 mg Oral Daily   mycophenolate   1,000 mg Oral BID   Na Sulfate-K Sulfate-Mg Sulfate concentrate  1 kit Oral Once   pantoprazole   40 mg Oral Daily   pravastatin   40 mg Oral QPM   revefenacin   175 mcg Nebulization Daily   sodium chloride  flush  10-40 mL Intracatheter Q12H   sulfamethoxazole -trimethoprim   1 tablet Oral Once per day on Monday Wednesday Friday   Continuous Infusions:  sodium chloride  20 mL/hr at 05/27/24 1129   cefTRIAXone  (ROCEPHIN )  IV 2 g (05/27/24 0929)   famotidine  (PEPCID ) IV     sodium chloride       Rhonda Dare, MD [facility] 05/27/2024, 12:44 PM

## 2024-05-27 NOTE — Progress Notes (Addendum)
 Referring Provider:  Dr. Caleen, TRH        Primary Care Physician:  Geronimo Amel, MD Primary Gastroenterologist:  Gordy Starch, MD            Reason for Consultation: rectal bleeding                 CONSULT NOTE  ASSESSMENT /  PLAN   73 y.o. female with a history of hypersensitivity pneumonitis related to interstitial lung disease on chronic steroids and now CellCept , CAD, hypertension, hyperlipidemia, steroid-induced diabetes, GERD, and now small PE who was admitted with acute on chronic hypoxic respiratory failure and now noted to have rectal bleeding.   Rectal bleeding with need for DOAC/internal hemorrhoid and probable fissure --she is overdue for colonoscopy.  My suspicion with normal hemoglobin is that this is outlet bleeding either prolapsed and bleeding internal hemorrhoid, fissure or both.  However given that she is due for colonoscopy and also needs DOAC for pulmonary embolism I recommended proceeding with colonoscopy while hospitalized.  I discussed this with Dr. Theophilus with pulmonary who feels that she is appropriate for MAC -- Colonoscopy tomorrow with monitored anesthesia care; we discussed the prep which will start this evening -- Post procedure we can consider hydrocortisone  suppository for symptomatic internal hemorrhoids -- Nitroglycerin  versus diltiazem  gel 3 times daily to the anal canal for fissure -- RectiCare 5% lidocaine  to the anal canal every 2-3 hours as needed for anorectal pain  2.  Acute on chronic respiratory failure/chronic hypersensitivity pneumonitis -- Steroids, CellCept  and recent Rituxan  --per pulmonary medicine -- She has completed antibiotics and oxygen  has been able to be weaned  3.  Pulmonary embolism with low clot burden no right heart strain -- Eliquis  after colonoscopy    HPI:     Rhonda [name] is a 73 y.o. female with a history of hypersensitivity pneumonitis related to interstitial lung disease on chronic steroids and now CellCept ,  CAD, hypertension, hyperlipidemia, steroid-induced diabetes, GERD, and now small PE who was admitted with acute on chronic hypoxic respiratory failure and now noted to have rectal bleeding.  We are consulted for rectal bleeding.  She is known to me from outpatient evaluation for GERD as it might relate to ILD.  She had a 24-hour pH and impedance test in 2019 which showed controlled GERD on PPI.  There was positive symptom correlation with cough associated with weakly acidic reflux events  Prior GI evaluation includes  EGD in May 2015 --largely unremarkable other than small hiatal hernia.  Gastric biopsies were unremarkable without H. pylori Colonoscopy December 2007 --small hyperplastic cecal polyp Colonoscopy July 2014 --normal  CT chest abdomen pelvis 05/24/2024 --unremarkable from GI perspective  She reports that she intermittently has issues with hemorrhoids at home.  She is also had looser stools intermittently since starting CellCept .  No abdominal pain.  She knows that she is due for colonoscopy but the decision was made last summer to not pursue colonoscopy due to her interstitial lung disease and the need for hospital-based procedure.  She had done FOBT testing and also has Cologuard at home but has not submitted it.  She describes over the last 24 hours having red blood per rectum with bowel movement.  Some mild burning with bowel movement but no abdominal pain.  No melenic stool.  Other than the looser stool with CellCept  she has not noted any major bowel changes.  Her reflux has been mostly controlled though she was told that omeprazole  may interfere  with her CellCept .  She still been taking some omeprazole  20 mg daily which helps her reflux.  Famotidine  was not helpful.  She had BAL on 05/23/2024 with Dr. Theophilus.  Studies from this showed inflammatory changes but no infectious etiology or malignancy.  Recent diagnosis of pulmonary embolism with low clot burden no right heart strain.   Eliquis  was given but now on hold with bleeding.  Past Medical History:  Diagnosis Date   Allergy    Arthritis    history spinal stenosis. osteoarthritis right hip   Asthma    Cataract    Coronary artery calcification seen on CAT scan 08/19/2017   >300 on CT scan 08/2017   DDD (degenerative disc disease), thoracic    Depression    DOE (dyspnea on exertion)    a. 04/2010 Lexi MV EF 71%, no ischemia/infarct;     Fatty liver    GERD (gastroesophageal reflux disease)    H/O steroid therapy    Steroid use orally over 4 yrs- for Lung Fibrosis   Heart palpitations 02/28/2015   Helicobacter pylori ab+    Hemorrhoids    Hiatal hernia    High cholesterol    History of chronic bronchitis    as child   History of migraine    History of MRSA infection    Hyperlipidemia, mixed 08/19/2017   Hyperplastic colon polyp 2007   IBS (irritable bowel syndrome)    Inguinal hernia    right   Insulin  resistance    past   Interstitial lung disease (HCC)    MVP (mitral valve prolapse)    Posterior mitral valve leaflet with mild MR   Pneumonia    Pneumonitis, hypersensitivity (HCC)    a. 09/2012 s/p Bx - ? 2/2 bird, mold, oil paint exposure ->on steroids, followed by pulm.   PONV (postoperative nausea and vomiting)    Pre-diabetes    takes Metformin    Pulmonary fibrosis (HCC)    Dr. Geronimo follows- stable at present   PVC (premature ventricular contraction) 08/19/2017   Rapid heart rate    Dr. Shlomo follows- last visit Epic note 9'16   Squamous cell carcinoma of skin    Tinnitus, right ear    Vocal cord ulcer     Past Surgical History:  Procedure Laterality Date   49 HOUR PH STUDY N/A 02/21/2018   Procedure: 24 HOUR PH STUDY;  Surgeon: Shila Gustav GAILS, MD;  Location: [facility];  Service: Endoscopy;  Laterality: N/A;   BREAST BIOPSY Right 2009   BIOPSY, pt denies   BREAST BIOPSY Left 2003   Benign    CATARACT EXTRACTION Left    CESAREAN SECTION     COLONOSCOPY      ESOPHAGEAL MANOMETRY N/A 02/21/2018   Procedure: ESOPHAGEAL MANOMETRY (EM);  Surgeon: Shila Gustav GAILS, MD;  Location: [facility];  Service: Endoscopy;  Laterality: N/A;   FOOT FRACTURE SURGERY  2006 or 2007   right   HYMENECTOMY     LUNG BIOPSY  09/28/2012   Procedure: LUNG BIOPSY;  Surgeon: Elspeth JAYSON Millers, MD;  Location: [facility] OR;  Service: Thoracic;  Laterality: N/A;  lung biopsies tims three   RIGHT HEART CATH N/A 04/04/2024   Procedure: RIGHT HEART CATH;  Surgeon: Rolan Ezra RAMAN, MD;  Location: [facility] INVASIVE CV LAB;  Service: Cardiovascular;  Laterality: N/A;   SQUAMOUS CELL CARCINOMA EXCISION Left    left arm   TOTAL HIP ARTHROPLASTY Right 09/10/2015   Procedure: RIGHT TOTAL HIP ARTHROPLASTY ANTERIOR  APPROACH;  Surgeon: Donnice Car, MD;  Location: [facility];  Service: Orthopedics;  Laterality: Right;   TOTAL HIP ARTHROPLASTY Left 10/03/2019   Procedure: TOTAL HIP ARTHROPLASTY ANTERIOR APPROACH;  Surgeon: Car Donnice, MD;  Location: [facility];  Service: Orthopedics;  Laterality: Left;  70 mins   TUBAL LIGATION     UPPER GI ENDOSCOPY     VIDEO ASSISTED THORACOSCOPY  09/28/2012   Procedure: VIDEO ASSISTED THORACOSCOPY;  Surgeon: Elspeth JAYSON Millers, MD;  Location: [facility] OR;  Service: Thoracic;  Laterality: Right;   VIDEO BRONCHOSCOPY  11/19/2011   Procedure: VIDEO BRONCHOSCOPY WITH FLUORO;  Surgeon: Dorethia Cave, MD;  Location: [facility] ENDOSCOPY;  Service: Endoscopy;;   VIDEO BRONCHOSCOPY Bilateral 05/23/2024   Procedure: VIDEO BRONCHOSCOPY WITHOUT FLUORO;  Surgeon: Mannam, Praveen, MD;  Location: [facility];  Service: Cardiopulmonary;  Laterality: Bilateral;    Prior to Admission medications   Medication Sig Start Date End Date Taking? Authorizing Provider  albuterol  (PROAIR  HFA) 108 (90 Base) MCG/ACT inhaler Inhale 2 puffs into the lungs every 4 (four) hours as needed for wheezing. Or coughing spells.  You may use 2 Puffs 5-10 minutes before exercise. Patient taking differently: Inhale  2 puffs into the lungs every 4 (four) hours as needed for wheezing (or coughing. May also use 2 puffs 5-10 minutes before exercise.). 03/17/24  Yes Amin, Ankit C, MD  Alpha-D-Galactosidase (BEANO PO) Take 1 tablet by mouth as needed (as directed, if eating onion or garlic).   Yes [provider]  ALPRAZolam  (XANAX ) 0.25 MG tablet Take 0.25 mg by mouth at bedtime as needed for anxiety or sleep.   Yes [provider]  ascorbic acid (VITAMIN C) 500 MG tablet Take 500 mg by mouth daily.   Yes [provider]  ASHWAGANDHA PO Take 1 capsule by mouth every other day.   Yes [provider]  azelastine (ASTELIN) 0.1 % nasal spray Place 1 spray into both nostrils in the morning. Use in each nostril as directed   Yes [provider]  budesonide  (PULMICORT ) 0.5 MG/2ML nebulizer solution Take 2 mLs (0.5 mg total) by nebulization in the morning and at bedtime. Patient taking differently: Take 0.5 mg by nebulization 2 (two) times daily as needed (for congestion). 03/17/24  Yes Amin, Ankit C, MD  Calcium  Carb-Cholecalciferol (CALCIUM  500 + D PO) Take 1 tablet by mouth daily.   Yes [provider]  chlorpheniramine (CHLOR-TRIMETON) 4 MG tablet Take 2 mg by mouth 2 (two) times daily as needed for allergies or rhinitis.   Yes [provider]  cholecalciferol (VITAMIN D3) 25 MCG (1000 UNIT) tablet Take 1,000 Units by mouth every other day.   Yes [provider]  ciclopirox (PENLAC) 8 % solution Apply 1 application  topically See admin instructions. Apply over toenails and surrounding skin at bedtime as needed for fungus. Apply daily over previous coat. After seven (7) days, may remove with alcohol and continue cycle.   Yes [provider]  Continuous Glucose Sensor (DEXCOM G7 SENSOR) MISC Check blood sugar fasting and before meals and at bedtime. 04/23/24  Yes   dicyclomine  (BENTYL ) 10 MG capsule Take 10 mg by mouth 2 (two) times daily as needed  for spasms. 08/16/18  Yes [provider]  diltiazem  (CARDIZEM  CD) 120 MG 24 hr capsule Take 1 capsule (120 mg total) by mouth daily. 03/24/24  Yes Lavona Agent, MD  diphenhydrAMINE -PE-APAP (DELSYM COUGH/COLD NIGHT TIME) 12.5-5-325 MG/10ML LIQD Take 10 mLs by mouth every 12 (twelve) hours  as needed (for coughing).   Yes [provider]  EPINEPHrine  0.3 mg/0.3 mL IJ SOAJ injection Inject 0.3 mg into the muscle as needed for anaphylaxis.   Yes [provider]  ezetimibe  (ZETIA ) 10 MG tablet Take 1 tablet (10 mg total) by mouth daily. Patient taking differently: Take 10 mg by mouth at bedtime. 02/10/24  Yes Lavona Agent, MD  fluticasone  (FLONASE ) 50 MCG/ACT nasal spray Spray 2 sprays into each nostril every day. Patient taking differently: Place 2 sprays into both nostrils in the morning. 03/06/24  Yes Geronimo Amel, MD  fluticasone  furoate-vilanterol (BREO ELLIPTA ) 100-25 MCG/ACT AEPB Inhale 1 puff into the lungs daily. 03/17/24  Yes Amin, Ankit C, MD  HUMALOG  KWIKPEN 100 UNIT/ML KwikPen Inject 8-10 Units into the skin See admin instructions. Inject 10 units into the skin before breakfast & lunch and 8 before supper 01/27/24  Yes [provider]  Hypromellose (NATURES TEARS OP) Place 1 drop into both eyes in the morning and at bedtime.   Yes [provider]  Insulin  Disposable Pump (OMNIPOD 5 G7 PODS, GEN 5,) MISC Inject 1 Device into the skin every 3 (three) days.   Yes [provider]  ipratropium-albuterol  (DUONEB) 0.5-2.5 (3) MG/3ML SOLN Take 3 mLs by nebulization every 6 (six) hours as needed (for shortness of breath or wheezing or when sick). 03/17/24  Yes Amin, Ankit C, MD  levocetirizine (XYZAL ) 5 MG tablet Take 5 mg by mouth at bedtime. 09/26/20  Yes [provider]  metFORMIN  (GLUCOPHAGE -XR) 500 MG 24 hr tablet Take 500 mg by mouth daily with breakfast. 07/08/23  Yes [provider]  metoprolol  succinate (TOPROL -XL) 25 MG  24 hr tablet Take 1 tablet (25 mg total) by mouth daily. 06/10/23  Yes Lavona Agent, MD  montelukast  (SINGULAIR ) 10 MG tablet TAKE 1 TABLET BY MOUTH EVERYDAY AT BEDTIME Patient taking differently: Take 10 mg by mouth at bedtime. 01/29/20  Yes Geronimo Amel, MD  Multiple Vitamin (MULTIVITAMIN) tablet Take 1 tablet by mouth daily with breakfast.   Yes [provider]  mycophenolate  (CELLCEPT ) 500 MG tablet Take 1 tab twice daily x 2 weeks. REPEAT LABS. If stable, increase to 2 tabs twice daily x 2 weeks. REPEAT LABS. If stable, increase to 3 tabs twice daily 02/07/24  Yes Geronimo Amel, MD  nitroGLYCERIN  (NITROSTAT ) 0.4 MG SL tablet Place 1 tablet (0.4 mg total) under the tongue every 5 (five) minutes as needed for chest pain. 12/06/18 05/22/24 Yes Kroeger, Krista M., PA-C  nystatin  cream (MYCOSTATIN ) Apply 1 Application topically 2 (two) times daily as needed (for irritation- affected sites).   Yes [provider]  omeprazole  (PRILOSEC  OTC) 20 MG tablet Take 10 mg by mouth See admin instructions. Take 10 mg by mouth at 4:30 AM   Yes [provider]  OXYGEN  Inhale 4-5 L/min into the lungs See admin instructions. Inhale 4-5 L/min at bedtime and as needed for shortness of breath during the day   Yes [provider]  pravastatin  (PRAVACHOL ) 40 MG tablet Take 1 tablet (40 mg total) by mouth every evening. Patient taking differently: Take 40 mg by mouth at bedtime. 06/12/20  Yes Lavona Agent, MD  predniSONE  (DELTASONE ) 10 MG tablet Take 6 tablets (60 mg total) by mouth daily with breakfast for 14 days, THEN 4 tablets (40 mg total) daily with breakfast for 14 days, THEN 3 tablets (30 mg total) daily with breakfast for 14 days, THEN 2 tablets (20 mg total) daily with breakfast for  14 days, THEN 1 tablet (10 mg total) daily with breakfast. Patient taking differently: Take 60 mg by mouth with breakfast  03/18/24 06/12/24 Yes Amin, Ankit C, MD  PREPARATION H 0.25-50 % GEL  Place 1 application  rectally 2 (two) times daily as needed (for hemorrhoids).   Yes [provider]  sodium chloride  (OCEAN) 0.65 % SOLN nasal spray Place 1 spray into both nostrils as needed for congestion.   Yes [provider]  Sodium Chloride , Inhalant, 7 % NEBU Use per nebulizer once a day to help clear sputum 07/08/21  Yes Geronimo Amel, MD  sulfamethoxazole -trimethoprim  (BACTRIM  DS) 800-160 MG tablet Take 1 tablet by mouth every Monday, Wednesday, and Friday.   Yes [provider]  AMBULATORY NON FORMULARY MEDICATION See admin instructions. Allergy injections on Mondays and hold when sick Patient not taking: Reported on 05/22/2024    [provider]    Current Facility-Administered Medications  Medication Dose Route Frequency Provider Last Rate Last Admin   0.9 %  sodium chloride  infusion   Intravenous Continuous Mahathi Pokorney, Gordy HERO, MD       acetaminophen  (TYLENOL ) tablet 650 mg  650 mg Oral Q6H PRN Dena Charleston, MD       Or   acetaminophen  (TYLENOL ) suppository 650 mg  650 mg Rectal Q6H PRN Dena Charleston, MD       albuterol  (PROVENTIL ) (2.5 MG/3ML) 0.083% nebulizer solution 2.5 mg  2.5 mg Nebulization Once PRN Mannam, Praveen, MD       ALPRAZolam  (XANAX ) tablet 0.25 mg  0.25 mg Oral QHS PRN Vann, Jessica U, DO   0.25 mg at 05/26/24 2208   arformoterol  (BROVANA ) nebulizer solution 15 mcg  15 mcg Nebulization BID Dena Charleston, MD   15 mcg at 05/27/24 9157   azithromycin  (ZITHROMAX ) tablet 500 mg  500 mg Oral Daily Rai, Ripudeep K, MD   500 mg at 05/27/24 9076   budesonide  (PULMICORT ) nebulizer solution 0.5 mg  0.5 mg Nebulization BID Dena Charleston, MD   0.5 mg at 05/27/24 9157   cefTRIAXone  (ROCEPHIN ) 2 g in sodium chloride  0.9 % 100 mL IVPB  2 g Intravenous Q12H Dorrell, Robert, MD 200 mL/hr at 05/27/24 0929 2 g at 05/27/24 9070   Chlorhexidine  Gluconate Cloth 2 % PADS 6 each  6 each Topical Daily Christobal Guadalajara, MD   6 each at 05/27/24 0931    dicyclomine  (BENTYL ) capsule 10 mg  10 mg Oral BID AC Amin, Sumayya, MD       diltiazem  (CARDIZEM  CD) 24 hr capsule 120 mg  120 mg Oral Daily Dorrell, Charleston, MD   120 mg at 05/27/24 9075   diphenhydrAMINE  (BENADRYL ) injection 50 mg  50 mg Intravenous Once PRN Mannam, Praveen, MD       EPINEPHrine  (ADRENALIN ) 0.3 mg  0.3 mg Intramuscular Q5 min PRN Mannam, Praveen, MD       famotidine  (PEPCID ) IVPB 20 mg premix  20 mg Intravenous Once PRN Mannam, Praveen, MD       famotidine  (PEPCID ) tablet 20 mg  20 mg Oral BID Vann, Jessica U, DO   20 mg at 05/27/24 9075   insulin  aspart (novoLOG ) injection 0-24 Units  0-24 Units Subcutaneous TID WC Dena Charleston, MD   8 Units at 05/27/24 0931   insulin  glargine-yfgn (SEMGLEE ) injection 15 Units  15 Units Subcutaneous Daily Dorrell, Robert, MD   15 Units at 05/26/24 0957   methylPREDNISolone  sodium succinate (SOLU-MEDROL ) 125 mg/2 mL injection 125 mg  125 mg Intravenous  BID Dena Charleston, MD   125 mg at 05/27/24 9075   methylPREDNISolone  sodium succinate (SOLU-MEDROL ) 125 mg/2 mL injection 125 mg  125 mg Intravenous Once PRN Mannam, Praveen, MD       metoprolol  succinate (TOPROL -XL) 24 hr tablet 25 mg  25 mg Oral Daily Dorrell, Robert, MD   25 mg at 05/27/24 9076   mycophenolate  (CELLCEPT ) capsule 1,000 mg  1,000 mg Oral BID Mannam, Praveen, MD   1,000 mg at 05/27/24 0605   Na Sulfate-K Sulfate-Mg Sulfate concentrate (SUPREP) kit 354 mL  1 kit Oral Once Rashunda Passon, Gordy HERO, MD       ondansetron  (ZOFRAN ) tablet 4 mg  4 mg Oral Q6H PRN Dena Charleston, MD       Or   ondansetron  (ZOFRAN ) injection 4 mg  4 mg Intravenous Q6H PRN Dena Charleston, MD   4 mg at 05/23/24 1416   pantoprazole  (PROTONIX ) EC tablet 40 mg  40 mg Oral Daily Amin, Sumayya, MD   40 mg at 05/27/24 9076   pravastatin  (PRAVACHOL ) tablet 40 mg  40 mg Oral QPM Dena Charleston, MD   40 mg at 05/26/24 1808   revefenacin  (YUPELRI ) nebulizer solution 175 mcg  175 mcg Nebulization Daily Dena Charleston, MD    175 mcg at 05/27/24 9157   sodium chloride  0.9 % bolus 1,000 mL  1,000 mL Intravenous Once PRN Mannam, Praveen, MD       sodium chloride  flush (NS) 0.9 % injection 10-40 mL  10-40 mL Intracatheter Q12H Kc, Ramesh, MD   10 mL at 05/26/24 2217   sulfamethoxazole -trimethoprim  (BACTRIM  DS) 800-160 MG per tablet 1 tablet  1 tablet Oral Once per day on Monday Wednesday Friday Dena Charleston, MD   1 tablet at 05/26/24 0815    Allergies as of 05/22/2024 - Review Complete 05/22/2024  Allergen Reaction Noted   Ozempic (0.25 or 0.5 mg-dose) [semaglutide(0.25 or 0.5mg -dos)] Other (See Comments) 03/14/2024   Tape Other (See Comments) 03/14/2024   Betadine [povidone iodine] Other (See Comments) 09/13/2012   Codeine Nausea And Vomiting 06/03/2017   Garlic Diarrhea 10/31/2018   Hydrocodone  Nausea And Vomiting 09/28/2019   Iodine Other (See Comments) 05/22/2024   Macrobid [nitrofurantoin] Other (See Comments) 03/19/2020   Ofev  [nintedanib] Other (See Comments) 03/19/2020   Onion Diarrhea 10/31/2018   Shellfish allergy Nausea And Vomiting 09/23/2012   Statins Other (See Comments) 06/03/2017   Sulfa  antibiotics Other (See Comments) 11/02/2019    Family History  Problem Relation Age of Onset   Asthma Mother    Osteoarthritis Mother    Dementia Mother 95   Lymphoma Father    Diabetes Father    Hypertension Sister    Heart disease Sister    Kidney disease Sister    Allergic rhinitis Daughter    Ovarian cancer Maternal Aunt    Breast cancer Paternal Aunt    Breast cancer Paternal Aunt    Emphysema Maternal Grandmother    Asthma Maternal Grandmother    Lung disease Maternal Grandfather    Bone cancer Paternal Grandfather    Breast cancer Cousin    Breast cancer Cousin    Breast cancer Cousin    Colon cancer Neg Hx    Angioedema Neg Hx    Eczema Neg Hx    Immunodeficiency Neg Hx    Urticaria Neg Hx     Social History   Tobacco Use   Smoking status: Never   Smokeless tobacco: Never    Tobacco comments:    pt  states she experimented in college  Vaping Use   Vaping status: Never Used  Substance Use Topics   Alcohol use: Yes    Alcohol/week: 2.0 standard drinks of alcohol    Types: 1 Glasses of wine, 1 Standard drinks or equivalent per week    Comment: once a wk. 1-2 glasses   Drug use: No    Review of Systems: All systems reviewed and negative except where noted in HPI.  Physical Exam: Vital signs in last 24 hours: Temp:  [98 F (36.7 C)-98.5 F (36.9 C)] 98 F (36.7 C) (07/05 0516) Pulse Rate:  [73-83] 73 (07/05 0516) Resp:  [17-20] 20 (07/05 0516) BP: (122-154)/(64-75) 122/75 (07/05 0516) SpO2:  [96 %-100 %] 99 % (07/05 0842) FiO2 (%):  [28 %] 28 % (07/05 0842) Last BM Date : 05/26/24 Gen: awake, alert, NAD HEENT: anicteric  CV: RRR, no mrg Pulm: CTA b/l, fine crackles Abd: soft, NT/ND, +BS throughout Rectal: Small prolapsed hemorrhoid, tenderness posteriorly raising the question of fissure, no masses with red blood on the examining glove Ext: no c/c/e Neuro: nonfocal    Intake/Output from previous day: 07/04 0701 - 07/05 0700 In: 720 [P.O.:720] Out: -  Intake/Output this shift: Total I/O In: 120 [P.O.:120] Out: -   Lab Results: Recent Labs    05/25/24 0527 05/26/24 1045 05/27/24 0632  WBC 20.3* 16.2* 17.7*  HGB 13.1 12.5 13.8  HCT 42.7 40.8 45.0  PLT 195 182 197   BMET Recent Labs    05/25/24 0527 05/26/24 0337 05/27/24 0632  NA 137 137 139  K 4.3 4.3 3.9  CL 103 101 99  CO2 27 25 28   GLUCOSE 228* 217* 177*  BUN 27* 30* 25*  CREATININE 0.91 0.67 0.93  CALCIUM  8.8* 8.5* 8.8*   LFT Recent Labs    05/27/24 0632  ALBUMIN 3.1*    .    Latest Ref Rng & Units 05/27/2024    6:32 AM 05/26/2024   10:45 AM 05/25/2024    5:27 AM  CBC  WBC 4.0 - 10.5 K/uL 17.7  16.2  20.3   Hemoglobin 12.0 - 15.0 g/dL 86.1  87.4  86.8   Hematocrit 36.0 - 46.0 % 45.0  40.8  42.7   Platelets 150 - 400 K/uL 197  182  195     .    Latest Ref  Rng & Units 05/27/2024    6:32 AM 05/26/2024    3:37 AM 05/25/2024    5:27 AM  CMP  Glucose 70 - 99 mg/dL 822  782  771   BUN 8 - 23 mg/dL 25  30  27    Creatinine 0.44 - 1.00 mg/dL 9.06  9.32  9.08   Sodium 135 - 145 mmol/L 139  137  137   Potassium 3.5 - 5.1 mmol/L 3.9  4.3  4.3   Chloride 98 - 111 mmol/L 99  101  103   CO2 22 - 32 mmol/L 28  25  27    Calcium  8.9 - 10.3 mg/dL 8.8  8.5  8.8    Studies/Results: No results found.  Principal Problem:   Acute hypoxic respiratory failure (HCC) Active Problems:   Gastroesophageal reflux disease   Hypoxemia   Pulmonary fibrosis (HCC)   Interstitial lung disease (HCC)   Acute pulmonary embolism (HCC)    Gordy HERO. Traniyah Hallett, M.D. @  05/27/2024, 11:17 AM

## 2024-05-27 NOTE — Plan of Care (Signed)

## 2024-05-27 NOTE — Progress Notes (Signed)
 NAME:  Rhonda [name], MRN:  [id number], DOB:  1951/01/29, LOS: 5 ADMISSION DATE:  05/22/2024, CONSULTATION DATE:  05/22/2024 REFERRING MD:  Dr. Lenor-  EDP, CHIEF COMPLAINT:  Acute on chronic hypoxic respiratory failure    History of Present Illness:  Rhonda [name] is a 73 y.o. female with PMH significant for  hypersensitive pneumonitis related pulmonary fibrosis/interstitial lung disease immunosuppressed at baseline with chronic steroids, CAD, HTN, HLD, and steroids induce diabetes who presented to the ED at the direction of primary pulmonologist for complaints of hypoxic and change in sputum production.   Of note patient had had a progressive decline in respiratory status over that last year, with worst decline seen after suffering a rhinovirus infection in April. After this admission she was started on Prednisone  taper and CellCept , she tolerated this well until her steroids got to 10 of Prednisone  daily. At this time she began to desaturate quickly. He primary pulmonologist (Dr. Geronimo) wished to admit for further management but patient refused and decision was made to increase steroids back to 60mg  daily and symptoms resolved until 6/30 when hypoxic returned and sputum production changed.   Pertinent  Medical History  Hypersensitive pneumonitis related pulmonary fibrosis/interstitial lung disease immunosuppressed at baseline with chronic steroids, CAD, HTN, HLD, and steroids induce diabetes  Significant Hospital Events: Including procedures, antibiotic start and stop dates in addition to other pertinent events   6/30 Presented with more subjective hypoxia with and change in sputum production.  Started on heparin  for segmental PE 7/1 Bronchoscopy with BAL 7/2 lower extremity duplex with no DVT, CT chest abdomen pelvis with chronic interstitial changes.  No evidence of occult malignancy. 7/3 CellCept  increased to 1 g twice daily.  Given 1 g of Rituxan  7/4 Has bright red blood per  rectum and Eliquis  held.  GI consulted  Interim History / Subjective:     Objective    Blood pressure 122/75, pulse 73, temperature 98 F (36.7 C), resp. rate 20, height 4' 11 (1.499 m), weight 67.1 kg, SpO2 99%.    FiO2 (%):  [28 %] 28 %   Intake/Output Summary (Last 24 hours) at 05/27/2024 1001 Last data filed at 05/27/2024 0750 Gross per 24 hour  Intake 600 ml  Output --  Net 600 ml   Filed Weights   05/22/24 1220 05/23/24 0856 05/23/24 1309  Weight: 66.2 kg 68.2 kg 67.1 kg    Examination: Blood pressure 122/75, pulse 73, temperature 98 F (36.7 C), resp. rate 20, height 4' 11 (1.499 m), weight 67.1 kg, SpO2 99%. Gen:      No acute distress HEENT:  EOMI, sclera anicteric Neck:     No masses; no thyromegaly Lungs:    Clear to auscultation bilaterally; normal respiratory effort CV:         Regular rate and rhythm; no murmurs Abd:      + bowel sounds; soft, non-tender; no palpable masses, no distension Ext:    No edema; adequate peripheral perfusion Neuro: alert and oriented x 3 Psych: normal mood and affect   BAL cell count with monocyte, macrophage predominance.  Cultures are negative to date, cytology are pending  Echocardiogram on 6/20 with LVEF 65-70%, grade 1 diastolic dysfunction.  Normal RV size and function, TAPSE 1.9.  No atrial level shunts noted by color-flow Doppler RHC on 5/13 with no pulmonary hypertension.  Normal right and left heart filling pressures Bubble study  Resolved problem list   Assessment and Plan  Acute on chronic hypoxic  respiratory failure Chronic hypersensitivity pneumonitis  Chronic hypersensitivity pneumonitis, with recent exacerbation. Symptoms include decreased oxygen  saturation upon exertion. Previous treatments with doxycycline  and steroids were effective, but recent exacerbations have been resistant to multiple antibiotic and steroid cycles. Concerns about progression of interstitial lung disease the CT looks stable.  No evidence  of pulmonary hypertension or RV dysfunction on echocardiogram 6/20 and right heart cath 5/13.  No atrial shunt.  Respiratory virus panel is negative and no evidence of infection by procalcitonin and negative BAL cultures to date  - Change IV solumedrol to prednisone  60mg /day Continue Bactrim  for prophylaxis - CellCept  dose increased and given Rituxan  infusion on 7/3 - Stop antibiotics for CAP coverage after 5 days - PT, out of bed, mobilize - Outpatient Referral is pending for pulmonary transplant at [facility]  Bright red blood per rectum Eliquis  on hold, GI consulted by primary team  Pulmonary embolism, low clot burden with no evidence of right heart strain Low clot burden is unlikely to explain all her symptoms.  Eliquis  on hold No lower extremity DVT.  CT chest abdomen pelvis reviewed with no evidence of occult malignancy  Insulin -dependent diabetes mellitus  Insulin -dependent diabetes mellitus secondary to chronic steroid use. Concerns about blood sugar management with increased steroid dosage. Hospitalization will require frequent monitoring and adjustment of insulin .  - Monitor blood sugar every four hours while hospitalized  - Adjust insulin  as needed    Vision changes > improved Vision changes characterized by blurriness and difficulty focusing. Ophthalmologist evaluation ruled out diabetic retinopathy. Possible association with fluctuating blood sugar levels and steroid use.  - Monitor vision changes in relation to blood sugar and steroid levels   Best Practice (right click and Reselect all SmartList Selections daily)   Per primary   Signature:   Carmel Waddington MD [facility] See Amion for pager  If no response to pager , please call [phone] until 7pm After 7:00 pm call Elink  [phone] 05/27/2024, 10:01 AM

## 2024-05-28 ENCOUNTER — Inpatient Hospital Stay (HOSPITAL_COMMUNITY): Payer: Self-pay | Admitting: Anesthesiology

## 2024-05-28 ENCOUNTER — Encounter (HOSPITAL_COMMUNITY): Payer: Self-pay | Admitting: Internal Medicine

## 2024-05-28 ENCOUNTER — Encounter (HOSPITAL_COMMUNITY)
Admission: EM | Disposition: A | Discharge status: Home Health | Payer: Self-pay | Source: Ambulatory Visit | Attending: Internal Medicine

## 2024-05-28 DIAGNOSIS — K625 Hemorrhage of anus and rectum: Secondary | ICD-10-CM | POA: Diagnosis not present

## 2024-05-28 DIAGNOSIS — J679 Hypersensitivity pneumonitis due to unspecified organic dust: Secondary | ICD-10-CM | POA: Diagnosis not present

## 2024-05-28 DIAGNOSIS — D122 Benign neoplasm of ascending colon: Secondary | ICD-10-CM

## 2024-05-28 DIAGNOSIS — D124 Benign neoplasm of descending colon: Secondary | ICD-10-CM

## 2024-05-28 DIAGNOSIS — J849 Interstitial pulmonary disease, unspecified: Secondary | ICD-10-CM | POA: Diagnosis not present

## 2024-05-28 DIAGNOSIS — K648 Other hemorrhoids: Secondary | ICD-10-CM | POA: Diagnosis not present

## 2024-05-28 DIAGNOSIS — D120 Benign neoplasm of cecum: Secondary | ICD-10-CM | POA: Diagnosis not present

## 2024-05-28 DIAGNOSIS — I2699 Other pulmonary embolism without acute cor pulmonale: Secondary | ICD-10-CM | POA: Diagnosis not present

## 2024-05-28 DIAGNOSIS — D123 Benign neoplasm of transverse colon: Secondary | ICD-10-CM

## 2024-05-28 DIAGNOSIS — E119 Type 2 diabetes mellitus without complications: Secondary | ICD-10-CM | POA: Diagnosis not present

## 2024-05-28 DIAGNOSIS — D125 Benign neoplasm of sigmoid colon: Secondary | ICD-10-CM

## 2024-05-28 DIAGNOSIS — D128 Benign neoplasm of rectum: Secondary | ICD-10-CM

## 2024-05-28 DIAGNOSIS — J9621 Acute and chronic respiratory failure with hypoxia: Secondary | ICD-10-CM | POA: Diagnosis not present

## 2024-05-28 DIAGNOSIS — K219 Gastro-esophageal reflux disease without esophagitis: Secondary | ICD-10-CM | POA: Diagnosis not present

## 2024-05-28 DIAGNOSIS — D127 Benign neoplasm of rectosigmoid junction: Secondary | ICD-10-CM

## 2024-05-28 DIAGNOSIS — D3610 Benign neoplasm of peripheral nerves and autonomic nervous system, unspecified: Secondary | ICD-10-CM

## 2024-05-28 DIAGNOSIS — J9601 Acute respiratory failure with hypoxia: Secondary | ICD-10-CM | POA: Diagnosis not present

## 2024-05-28 DIAGNOSIS — Z794 Long term (current) use of insulin: Secondary | ICD-10-CM | POA: Diagnosis not present

## 2024-05-28 HISTORY — PX: COLONOSCOPY: SHX5424

## 2024-05-28 LAB — ANAEROBIC CULTURE W GRAM STAIN

## 2024-05-28 LAB — CBC
HCT: 39.9 % (ref 36.0–46.0)
Hemoglobin: 12.5 g/dL (ref 12.0–15.0)
MCH: 27.7 pg (ref 26.0–34.0)
MCHC: 31.3 g/dL (ref 30.0–36.0)
MCV: 88.3 fL (ref 80.0–100.0)
Platelets: 144 K/uL — ABNORMAL LOW (ref 150–400)
RBC: 4.52 MIL/uL (ref 3.87–5.11)
RDW: 17.8 % — ABNORMAL HIGH (ref 11.5–15.5)
WBC: 12.4 K/uL — ABNORMAL HIGH (ref 4.0–10.5)
nRBC: 0 % (ref 0.0–0.2)

## 2024-05-28 LAB — RENAL FUNCTION PANEL
Albumin: 2.8 g/dL — ABNORMAL LOW (ref 3.5–5.0)
Anion gap: 11 (ref 5–15)
BUN: 21 mg/dL (ref 8–23)
CO2: 29 mmol/L (ref 22–32)
Calcium: 8.2 mg/dL — ABNORMAL LOW (ref 8.9–10.3)
Chloride: 99 mmol/L (ref 98–111)
Creatinine, Ser: 0.61 mg/dL (ref 0.44–1.00)
GFR, Estimated: >60 mL/min (ref >60)
Glucose, Bld: 148 mg/dL — ABNORMAL HIGH (ref 70–99)
Phosphorus: 3.8 mg/dL (ref 2.5–4.6)
Potassium: 3.2 mmol/L — ABNORMAL LOW (ref 3.5–5.1)
Sodium: 139 mmol/L (ref 135–145)

## 2024-05-28 LAB — GLUCOSE, CAPILLARY
Glucose-Capillary: 175 mg/dL — ABNORMAL HIGH (ref 70–99)
Glucose-Capillary: 312 mg/dL — ABNORMAL HIGH (ref 70–99)
Glucose-Capillary: 253 mg/dL — ABNORMAL HIGH (ref 70–99)
Glucose-Capillary: 86 mg/dL (ref 70–99)

## 2024-05-28 LAB — MAGNESIUM: Magnesium: 2.6 mg/dL — ABNORMAL HIGH (ref 1.7–2.4)

## 2024-05-28 SURGERY — COLONOSCOPY
Anesthesia: Monitor Anesthesia Care

## 2024-05-28 MED ORDER — NITROGLYCERIN 2 % TD OINT
0.5000 [in_us] | TOPICAL_OINTMENT | Freq: Two times a day (BID) | TRANSDERMAL | Status: DC
  Administered 2024-05-28 – 2024-05-30 (×5): 0.5 [in_us] via TOPICAL
  Filled 2024-05-28: qty 30

## 2024-05-28 MED ORDER — PROPOFOL 10 MG/ML IV BOLUS
INTRAVENOUS | Status: DC | PRN
  Administered 2024-05-28 (×3): 20 mg via INTRAVENOUS

## 2024-05-28 MED ORDER — PROPOFOL 500 MG/50ML IV EMUL
INTRAVENOUS | Status: AC
  Filled 2024-05-28: qty 50

## 2024-05-28 MED ORDER — POTASSIUM CHLORIDE CRYS ER 20 MEQ PO TBCR
40.0000 meq | EXTENDED_RELEASE_TABLET | Freq: Once | ORAL | Status: AC
  Administered 2024-05-28: 40 meq via ORAL
  Filled 2024-05-28: qty 2

## 2024-05-28 MED ORDER — SODIUM CHLORIDE 0.9 % IV SOLN: INTRAVENOUS | Status: DC | PRN

## 2024-05-28 MED ORDER — HYDROCORTISONE ACETATE 25 MG RE SUPP
25.0000 mg | Freq: Every day | RECTAL | Status: DC
  Administered 2024-05-28 – 2024-05-29 (×2): 25 mg via RECTAL
  Filled 2024-05-28 (×2): qty 1

## 2024-05-28 MED ORDER — PROPOFOL 500 MG/50ML IV EMUL
INTRAVENOUS | Status: DC | PRN
  Administered 2024-05-28: 100 ug/kg/min via INTRAVENOUS

## 2024-05-28 MED ORDER — LIDOCAINE 5 % EX OINT: TOPICAL_OINTMENT | Freq: Four times a day (QID) | CUTANEOUS | Status: DC | PRN

## 2024-05-28 MED ORDER — ONDANSETRON HCL 4 MG/2ML IJ SOLN: 4.0000 mg | Freq: Once | INTRAMUSCULAR | Status: DC | PRN

## 2024-05-28 NOTE — Op Note (Signed)
 [facility] Patient Name: Rhonda [name] Procedure Date: 05/28/2024 MRN: [id number] Attending MD: Gordy CHRISTELLA Starch , MD, [id number] Date of Birth: 20-Aug-1951 CSN: [id number] Age: 73 Admit Type: Inpatient Procedure:                Colonoscopy Indications:              Rectal bleeding Providers:                Gordy CHRISTELLA. Starch, MD, Randall Lines, RN, Haskel Chris, Technician Referring MD:             [facility] Medicines:                Monitored Anesthesia Care Complications:            No immediate complications. Estimated Blood Loss:     Estimated blood loss was minimal. Procedure:                Pre-Anesthesia Assessment:                           - Prior to the procedure, a History and Physical                            was performed, and patient medications and                            allergies were reviewed. The patient's tolerance of                            previous anesthesia was also reviewed. The risks                            and benefits of the procedure and the sedation                            options and risks were discussed with the patient.                            All questions were answered, and informed consent                            was obtained. Prior Anticoagulants: The patient has                            taken Eliquis  (apixaban ), last dose was 2 days                            prior to procedure. ASA Grade Assessment: III - A                            patient with severe systemic disease. After  reviewing the risks and benefits, the patient was                            deemed in satisfactory condition to undergo the                            procedure.                           After obtaining informed consent, the colonoscope                            was passed under direct vision. Throughout the                            procedure, the patient's blood  pressure, pulse, and                            oxygen  saturations were monitored continuously. The                            [id number]-HQ190L ([id number]) Olympus colonoscope was                            introduced through the anus and advanced to the                            cecum, identified by appendiceal orifice and                            ileocecal valve. The colonoscopy was performed                            without difficulty. The patient tolerated the                            procedure well. The quality of the bowel                            preparation was good. The ileocecal valve,                            appendiceal orifice, and rectum were photographed. Scope In: 9:25:37 AM Scope Out: 10:09:47 AM Scope Withdrawal Time: 0 hours 36 minutes 17 seconds  Total Procedure Duration: 0 hours 44 minutes 10 seconds  Findings:      Hemorrhoids were found on perianal exam.      An anal fissure was found on perianal exam.      A 9 mm polyp was found in the cecum. The polyp was sessile. The polyp       was removed with a cold snare. Resection and retrieval were complete. To       prevent bleeding after the polypectomy, one hemostatic clip was       successfully placed (MR conditional). Clip manufacturer: [employer]. There was no bleeding  at the end of the maneuver.      Two sessile polyps were found in the ascending colon. The polyps were 5       to 6 mm in size. These polyps were removed with a cold snare. Resection       and retrieval were complete.      Three sessile polyps were found in the transverse colon. The polyps were       3 to 6 mm in size. These polyps were removed with a cold snare.       Resection and retrieval were complete.      A 6 mm polyp was found in the descending colon. The polyp was sessile.       The polyp was removed with a cold snare. Resection and retrieval were       complete.      Two sessile polyps were found in the sigmoid colon. The  polyps were 4 to       5 mm in size. These polyps were removed with a cold snare. Resection and       retrieval were complete.      Two sessile polyps were found in the recto-sigmoid colon. The polyps       were 3 to 6 mm in size. These polyps were removed with a cold snare.       Resection and retrieval were complete.      A 10 mm polyp was found in the proximal rectum. The polyp was       semi-pedunculated. The polyp was removed with a hot snare. Resection and       retrieval were complete. To prevent bleeding after the polypectomy, two       hemostatic clips were successfully placed (MR conditional). Clip       manufacturer: [employer]. There was no bleeding during, or at the       end, of the procedure.      Internal hemorrhoids were found during retroflexion and during digital       exam. The hemorrhoids were medium-sized. Impression:               - Anal fissure found on perianal exam.                           - One 9 mm polyp in the cecum, removed with a cold                            snare. Resected and retrieved. Clip (MR                            conditional) was placed. Clip manufacturer: [employer].                           - Two 5 to 6 mm polyps in the ascending colon,                            removed with a cold snare. Resected and retrieved.                           -  Three 3 to 6 mm polyps in the transverse colon,                            removed with a cold snare. Resected and retrieved.                           - One 6 mm polyp in the descending colon, removed                            with a cold snare. Resected and retrieved.                           - Two 4 to 5 mm polyps in the sigmoid colon,                            removed with a cold snare. Resected and retrieved.                           - Two 3 to 6 mm polyps at the recto-sigmoid colon,                            removed with a cold snare. Resected and  retrieved.                           - One 10 mm polyp in the proximal rectum, removed                            with a hot snare. Resected and retrieved. Clips (MR                            conditional) were placed. Clip manufacturer: [employer].                           - Internal hemorrhoids. Moderate Sedation:      N/A Recommendation:           - Return patient to hospital ward for ongoing care.                           - Resume previous diet.                           - Continue present medications.                           - Ok to resume DOAC tomorrow in setting of acute                            PE. Monitor closely for recurrent bleeding (though  hemorrhoidal bleeding may persist). Use stool                            softeners if needed to avoid hard stool. Avoid                            straining. Hydrocortisone  supp 25 mg PR x 5 nights.                            RectiCare 5% for perianal pain and fissure.                            Diltiazem  versus NTG ointment to anal canal BID x                            2-3 weeks.                           - Await pathology results.                           - Repeat colonoscopy may be recommended for                            surveillance. The colonoscopy date will be                            determined after pathology results from today's                            exam become available for review. Procedure Code(s):        --- Professional ---                           [phone], Colonoscopy, flexible; with removal of                            tumor(s), polyp(s), or other lesion(s) by snare                            technique Diagnosis Code(s):        --- Professional ---                           K60.2, Anal fissure, unspecified                           D12.0, Benign neoplasm of cecum                           D12.4, Benign neoplasm of descending colon                            D12.8, Benign neoplasm of rectum  D12.2, Benign neoplasm of ascending colon                           D12.3, Benign neoplasm of transverse colon (hepatic                            flexure or splenic flexure)                           D12.5, Benign neoplasm of sigmoid colon                           D12.7, Benign neoplasm of rectosigmoid junction                           K64.8, Other hemorrhoids                           K62.5, Hemorrhage of anus and rectum CPT copyright 2022 American [employer]. All rights reserved. The codes documented in this report are preliminary and upon coder review may  be revised to meet current compliance requirements. Gordy CHRISTELLA Starch, MD 05/28/2024 10:21:07 AM This report has been signed electronically. Number of Addenda: 0

## 2024-05-28 NOTE — Anesthesia Postprocedure Evaluation (Signed)
 Anesthesia Post Note  Patient: Rhonda [name]  Procedure(s) Performed: COLONOSCOPY     Patient location during evaluation: PACU Anesthesia Type: MAC Level of consciousness: awake and alert and oriented Pain management: pain level controlled Vital Signs Assessment: post-procedure vital signs reviewed and stable Respiratory status: spontaneous breathing, nonlabored ventilation and respiratory function stable Cardiovascular status: stable and blood pressure returned to baseline Postop Assessment: no apparent nausea or vomiting Anesthetic complications: no   No notable events documented.  Last Vitals:  Vitals:   05/28/24 0911 05/28/24 1022  BP: (!) 164/88 (!) 139/57  Pulse:    Resp: (!) 21   Temp: 36.8 C 36.4 C  SpO2: 98%     Last Pain:  Vitals:   05/28/24 1031  TempSrc:   PainSc: 0-No pain                 [name] A.

## 2024-05-28 NOTE — Transfer of Care (Signed)
 Immediate Anesthesia Transfer of Care Note  Patient: Rhonda [name]  Procedure(s) Performed: COLONOSCOPY  Patient Location: PACU  Anesthesia Type:MAC  Level of Consciousness: awake, alert , oriented, and patient cooperative  Airway & Oxygen  Therapy: Patient Spontanous Breathing and Patient connected to nasal cannula oxygen   Post-op Assessment: Report given to RN and Post -op Vital signs reviewed and stable  Post vital signs: Reviewed and stable  Last Vitals:  Vitals Value Taken Time  BP 139/57 05/28/24 10:21  Temp    Pulse 85 05/28/24 10:22  Resp 18 05/28/24 10:22  SpO2 100 % 05/28/24 10:22  Vitals shown include unfiled device data.  Last Pain:  Vitals:   05/28/24 0911  TempSrc: Temporal  PainSc: 0-No pain      Patients Stated Pain Goal: 0 (05/28/24 0911)  Complications: No notable events documented.

## 2024-05-28 NOTE — Plan of Care (Signed)

## 2024-05-28 NOTE — Anesthesia Procedure Notes (Signed)
 Procedure Name: MAC Date/Time: 05/28/2024 9:20 AM  Performed by: Dasie Nena PARAS, CRNAPre-anesthesia Checklist: Patient identified, Emergency Drugs available, Suction available, Patient being monitored and Timeout performed Oxygen  Delivery Method: Simple face mask Placement Confirmation: positive ETCO2

## 2024-05-28 NOTE — Progress Notes (Signed)
 PROGRESS NOTE Rhonda [name]  FMW:[id number] DOB: November 08, 1951 DOA: 05/22/2024 PCP: Geronimo Amel, MD  Brief Narrative/Hospital Course: 73 y.o. female with interstitial lung disease, CAD, GERD, HTN, HLP chronic hypersensitive pneumonitis on CellCept  prednisone , presented to ED due to shortness of breath,. In ED afebrile hemodynamically stable.  PCCM consulted and admitted, Labs were obtained which showed glucose 234, creat 0.89, WBC 13.5, hemoglobin 13.7, troponin within normal limits, BNP 89, respiratory viral panel negative.  Lactic acid 2.1.  CXR>no acute infiltrates. CTA> Small bilateral Pes-heparin  drip started S/P bronchoscopy on 7/1.  7/4: Hemodynamically stable.  Developed bleeding per rectum, had 2 episodes this morning.  GI was consulted and holding Eliquis .  Hemoglobin decreased to 12.5 from 13.2 today. Received rituximab  infusion yesterday.  7/5: Hemodynamically stable, some improvement of hemoglobin to 13.8 but she continued to have bleeding per rectum.  GI is planning colonoscopy tomorrow morning.  We will keep holding Eliquis  until cleared from GI.  7/6: Vital stable with hemoglobin of 12.5 this morning.  Potassium 3.2 which is being repleted.  Colonoscopy today with anal fissure, internal hemorrhoids and multiple polyps in colon which were removed and sent for pathology. Pulmonary to continue Solu-Medrol  at 60 mg daily and 1 g of CellCept  and they will follow-up as outpatient  Subjective: Patient was seen after the colonoscopy.  Still having some bleeding per rectum.    Assessment and plan:  [facility] ILD flareup  Small bilateral PE-low clot burden-dvt study neg Chronic hypersensitive pneumonitis: Patient on chronic steroids and CellCept  at baseline presented with shortness of breath, CT imaging shows small bilateral PE, concern for ILD flareup PCCM following.  S/p bronchoscopy 7/1, follow BAL studies. F/u culture, cytology.Cell count with monocyte, macrophage predominance.  antibiotic with ceftriaxone , azithromycin ,  Received high-dose Solu-Medrol  and rituximab  Continuing steroid Pulmonary is on board CellCept  dose was increased and continuing Bactrim  Continue supple oxygen  to keep above 92%, continue plan of care as per pulmonary CT chest abdomen pelvis-7/2 no lymphadenopathy or metastatic disease Cont to Increase mobility with PT, mobilize  Bleeding per rectum.  Patient continued to have some bleeding per rectum, small decrease in hemoglobin to 12.5 from 13.2 initially and now seems stable at 13.8.  Patient was recently started on Eliquis . - Holding Eliquis   -GI was consulted-s/p colonoscopy today with anal fissure, internal hemorrhoid and multiple colonic polyps which were removed and sent for pathology. -Keep holding Eliquis  until cleared from GI   Diabetes mellitus type 2, uncontrolled with hyperglycemia: Uncontrolled in the setting of steroid A1c 7.2 on Semglee  15 units daily, SSI, monitor and adjust   Hyperlipidemia Continue Zetia , statin   Hypertension Stable, continue diltiazem , Toprol  XL   Vision changes with blurriness: Possibly due to hyperglycemia with steroid use ophthalmology evaluation rule out diabetic retinopathy.  Monitor   DVT prophylaxis: SCDs Start: 05/22/24 1756 Code Status:   Code Status: Full Code Family Communication: Discussed with patient   Patient status is: Remains hospitalized because of severity of illness Level of care: Telemetry   Dispo: The patient is from: home            Anticipated disposition: TBD  Objective: Vitals last 24 hrs: Vitals:   05/28/24 1022 05/28/24 1031 05/28/24 1043 05/28/24 1132  BP: (!) 139/57 (!) 140/60  (!) 168/86  Pulse:  81 86 (!) 103  Resp:  (!) 22 (!) 22 20  Temp: 97.6 F (36.4 C)   97.8 F (36.6 C)  TempSrc:    Oral  SpO2:  100% 98%  94%  Weight:      Height:        Physical Examination: General.  Well-developed lady, in no acute distress. Pulmonary.  Lungs clear  bilaterally, normal respiratory effort. CV.  Regular rate and rhythm, no JVD, rub or murmur. Abdomen.  Soft, nontender, nondistended, BS positive. CNS.  Alert and oriented .  No focal neurologic deficit. Extremities.  No edema, no cyanosis, pulses intact and symmetrical. Psychiatry.  Judgment and insight appears normal.     Data Reviewed: I have personally reviewed following labs and imaging studies ( see epic result tab) CBC: Recent Labs  Lab 05/24/24 0028 05/25/24 0527 05/26/24 1045 05/27/24 0632 05/28/24 0425  WBC 20.3* 20.3* 16.2* 17.7* 12.4*  HGB 12.5 13.1 12.5 13.8 12.5  HCT 40.6 42.7 40.8 45.0 39.9  MCV 90.2 91.6 90.9 90.7 88.3  PLT 182 195 182 197 144*   CMP: Recent Labs  Lab 05/24/24 0028 05/25/24 0527 05/26/24 0337 05/27/24 0632 05/28/24 0425  NA 139 137 137 139 139  K 4.0 4.3 4.3 3.9 3.2*  CL 106 103 101 99 99  CO2 24 27 25 28 29   GLUCOSE 164* 228* 217* 177* 148*  BUN 26* 27* 30* 25* 21  CREATININE 1.08* 0.91 0.67 0.93 0.61  CALCIUM  8.5* 8.8* 8.5* 8.8* 8.2*  MG  --   --   --   --  2.6*  PHOS  --  3.9 3.3 4.3 3.8   GFR: Estimated Creatinine Clearance: 50.6 mL/min (by C-G formula based on SCr of 0.61 mg/dL). Recent Labs  Lab 05/25/24 0527 05/26/24 0337 05/27/24 0632 05/28/24 0425  ALBUMIN 3.1* 2.8* 3.1* 2.8*   No results for input(s): LIPASE, AMYLASE in the last 168 hours. No results for input(s): AMMONIA in the last 168 hours. Coagulation Profile: No results for input(s): INR, PROTIME in the last 168 hours. Unresulted Labs (From admission, onward)     Start     Ordered   05/23/24 1423  Acid Fast Culture with reflexed sensitivities  RELEASE UPON ORDERING,   TIMED       Comments: Specimen A: Specimen Description RUL BAL Phone [phone] Immunocompromised?  No  Antibiotic Treatment:  none Is the patient on airborne/droplet precautions? No Clinical History:  N/A Special Instructions:  none Specimen Disposition:  Microbiology, cytology, cell  count     05/23/24 1423   05/23/24 1423  Acid Fast Culture with reflexed sensitivities  RELEASE UPON ORDERING,   TIMED       Comments: Specimen B: Specimen Description LUL BAL Phone [phone] Immunocompromised?  No  Antibiotic Treatment:  none Is the patient on airborne/droplet precautions? No Clinical History:  N/A Special Instructions:  none Specimen Disposition:  Microbiology, cytology, cell count     05/23/24 1423           Antimicrobials/Microbiology: Anti-infectives (From admission, onward)    Start     Dose/Rate Route Frequency Ordered Stop   05/25/24 0800  azithromycin  (ZITHROMAX ) tablet 500 mg  Status:  Discontinued        500 mg Oral Daily 05/24/24 1226 05/28/24 1215   05/24/24 0900  sulfamethoxazole -trimethoprim  (BACTRIM  DS) 800-160 MG per tablet 1 tablet        1 tablet Oral Once per day on Monday Wednesday Friday 05/22/24 2234     05/23/24 1000  sulfamethoxazole -trimethoprim  (BACTRIM  DS) 800-160 MG per tablet 1 tablet  Status:  Discontinued        1 tablet Oral Daily 05/22/24 2229 05/22/24 2234  05/22/24 1630  cefTRIAXone  (ROCEPHIN ) 2 g in sodium chloride  0.9 % 100 mL IVPB  Status:  Discontinued        2 g 200 mL/hr over 30 Minutes Intravenous Every 12 hours 05/22/24 1557 05/28/24 1215   05/22/24 1630  azithromycin  (ZITHROMAX ) 500 mg in sodium chloride  0.9 % 250 mL IVPB  Status:  Discontinued        500 mg 250 mL/hr over 60 Minutes Intravenous Daily 05/22/24 1557 05/24/24 1226         Component Value Date/Time   SDES  05/23/2024 1417    BRONCHIAL ALVEOLAR LAVAGE Performed at [facility], [address], [place], [place] [address]    SDES BRONCHIAL ALVEOLAR LAVAGE 05/23/2024 1417   SPECREQUEST LUL 05/23/2024 1417   SPECREQUEST LUL 05/23/2024 1417   CULT  05/23/2024 1417    NO GROWTH 2 DAYS Performed at [facility], [address] [place], [place] [address]    CULT  05/23/2024 1417    NO ANAEROBES ISOLATED; CULTURE IN  PROGRESS FOR 5 DAYS   REPTSTATUS 05/26/2024 FINAL 05/23/2024 1417   REPTSTATUS PENDING 05/23/2024 1417    Procedures: Procedure(s) (LRB): COLONOSCOPY (N/A) Medications reviewed:  Scheduled Meds:  arformoterol   15 mcg Nebulization BID   budesonide   0.5 mg Nebulization BID   Chlorhexidine  Gluconate Cloth  6 each Topical Daily   dicyclomine   10 mg Oral BID AC   diltiazem   120 mg Oral Daily   famotidine   20 mg Oral BID   hydrocortisone   25 mg Rectal QHS   insulin  aspart  0-24 Units Subcutaneous TID WC   insulin  glargine-yfgn  15 Units Subcutaneous Daily   methylPREDNISolone  (SOLU-MEDROL ) injection  125 mg Intravenous BID   metoprolol  succinate  25 mg Oral Daily   mycophenolate   1,000 mg Oral BID   nitroGLYCERIN   0.5 inch Topical BID   pantoprazole   40 mg Oral Daily   pravastatin   40 mg Oral QPM   revefenacin   175 mcg Nebulization Daily   sodium chloride  flush  10-40 mL Intracatheter Q12H   sulfamethoxazole -trimethoprim   1 tablet Oral Once per day on Monday Wednesday Friday   Continuous Infusions:  famotidine  (PEPCID ) IV     sodium chloride       Amaryllis Dare, MD [facility] 05/28/2024, 3:27 PM

## 2024-05-28 NOTE — Progress Notes (Signed)
 Physical Therapy Treatment Patient Details Name: Rhonda [name] MRN: [id number] DOB: January 27, 1951 Today's Date: 05/28/2024   History of Present Illness Rhonda [name] is a 73 y.o. female admitted 05/22/24 with medical history significant of interstitial lung disease, CAD, GERD, hyperlipidemia, hypertension who presented to the emergency department due to shortness of breath.  Pt admitted for [facility], ILD flareup, Small bilateral PE.  Pt also with rectal bleeding and s/p colonoscopy 05/28/24.    PT Comments  Pt reports colonoscopy earlier today.  Pt assisted to bathroom per request (still with rectal bleeding and pt notified RN during ambulation).  Pt's SPO2 dropped to 86% on room air with using bathroom so 2L O2 [place] applied for ambulating in hallway.  SPO2 90-93% on 2L and pt ambulated 360 ft with her SPC.      If plan is discharge home, recommend the following: Help with stairs or ramp for entrance;Assistance with cooking/housework   Can travel by private vehicle        Equipment Recommendations  None recommended by PT    Recommendations for Other Services       Precautions / Restrictions Precautions Precautions: Fall Recall of Precautions/Restrictions: Intact Precaution/Restrictions Comments: chronic O2 (none at rest but 3-4L with activity lately per pt)     Mobility  Bed Mobility Overal bed mobility: Modified Independent                  Transfers Overall transfer level: Needs assistance Equipment used: None Transfers: Sit to/from Stand Sit to Stand: Contact guard assist           General transfer comment: CGA for safety; feeling a little off due to colonscopy earlier; SpO2 also dropped to 86% on room air with using bathroom so applied 2L to ambulate    Ambulation/Gait Ambulation/Gait assistance: Contact guard assist Gait Distance (Feet): 360 Feet Assistive device: Rolling walker (2 wheels), Straight cane Gait Pattern/deviations: Step-through pattern,  Decreased stride length Gait velocity: decr     General Gait Details: pt using SPC from home well, no unsteadiness or LOB observed; ambulated on 2L O2 [place] and SPO2 at lowest was 90%   Stairs             Wheelchair Mobility     Tilt Bed    Modified Rankin (Stroke Patients Only)       Balance                                            Communication Communication Communication: No apparent difficulties  Cognition Arousal: Alert Behavior During Therapy: WFL for tasks assessed/performed   PT - Cognitive impairments: No apparent impairments                         Following commands: Intact      Cueing Cueing Techniques: Verbal cues  Exercises      General Comments        Pertinent Vitals/Pain Pain Assessment Pain Assessment: No/denies pain    Home Living                          Prior Function            PT Goals (current goals can now be found in the care plan section) Progress towards PT goals: Progressing toward goals  Frequency    Min 2X/week      PT Plan      Co-evaluation              AM-PAC PT 6 Clicks Mobility   Outcome Measure  Help needed turning from your back to your side while in a flat bed without using bedrails?: None Help needed moving from lying on your back to sitting on the side of a flat bed without using bedrails?: None Help needed moving to and from a bed to a chair (including a wheelchair)?: None Help needed standing up from a chair using your arms (e.g., wheelchair or bedside chair)?: None Help needed to walk in hospital room?: A Little Help needed climbing 3-5 steps with a railing? : A Little 6 Click Score: 22    End of Session Equipment Utilized During Treatment: Gait belt;Oxygen  Activity Tolerance: Patient tolerated treatment well Patient left: in bed;with call bell/phone within reach Nurse Communication: Mobility status PT Visit Diagnosis: Difficulty in  walking, not elsewhere classified (R26.2)     Time: 8543-8487 PT Time Calculation (min) (ACUTE ONLY): 16 min  Charges:    $Gait Training: 8-22 mins PT General Charges $$ ACUTE PT VISIT: 1 Visit                    Tari KLEIN, DPT Physical Therapist Acute Rehabilitation Services Office: [phone]    Tari CROME [place] 05/28/2024, 5:13 PM

## 2024-05-28 NOTE — Progress Notes (Signed)
 NAME:  Rhonda [name], MRN:  [id number], DOB:  November 14, 1951, LOS: 6 ADMISSION DATE:  05/22/2024, CONSULTATION DATE:  05/22/2024 REFERRING MD:  Dr. Lenor-  EDP, CHIEF COMPLAINT:  Acute on chronic hypoxic respiratory failure    History of Present Illness:  Rhonda [name] is a 73 y.o. female with PMH significant for  hypersensitive pneumonitis related pulmonary fibrosis/interstitial lung disease immunosuppressed at baseline with chronic steroids, CAD, HTN, HLD, and steroids induce diabetes who presented to the ED at the direction of primary pulmonologist for complaints of hypoxic and change in sputum production.   Of note patient had had a progressive decline in respiratory status over that last year, with worst decline seen after suffering a rhinovirus infection in April. After this admission she was started on Prednisone  taper and CellCept , she tolerated this well until her steroids got to 10 of Prednisone  daily. At this time she began to desaturate quickly. He primary pulmonologist (Dr. Geronimo) wished to admit for further management but patient refused and decision was made to increase steroids back to 60mg  daily and symptoms resolved until 6/30 when hypoxic returned and sputum production changed.   Pertinent  Medical History  Hypersensitive pneumonitis related pulmonary fibrosis/interstitial lung disease immunosuppressed at baseline with chronic steroids, CAD, HTN, HLD, and steroids induce diabetes  Significant Hospital Events: Including procedures, antibiotic start and stop dates in addition to other pertinent events   6/30 Presented with more subjective hypoxia with and change in sputum production.  Started on heparin  for segmental PE, IV Solu-Medrol  125 mg twice daily 7/1 Bronchoscopy with BAL 7/2 lower extremity duplex with no DVT, CT chest abdomen pelvis with chronic interstitial changes.  No evidence of occult malignancy. 7/3 CellCept  increased to 1 g twice daily.  Given 1 g of  Rituxan  7/4 Has bright red blood per rectum and Eliquis  held.  GI consulted 7/5 IV Solu-Medrol  changed to 60 mg of prednisone   Interim History / Subjective:   Underwent colonoscopy with findings of anal fissure, hemorrhoids, polyps.  No active bleed  Objective    Blood pressure (!) 168/86, pulse (!) 103, temperature 97.8 F (36.6 C), temperature source Oral, resp. rate 20, height 4' 11 (1.499 m), weight 67.1 kg, SpO2 94%.        Intake/Output Summary (Last 24 hours) at 05/28/2024 1211 Last data filed at 05/28/2024 1022 Gross per 24 hour  Intake 1071.56 ml  Output --  Net 1071.56 ml   Filed Weights   05/23/24 0856 05/23/24 1309 05/28/24 0911  Weight: 68.2 kg 67.1 kg 67.1 kg    Examination: Gen:      No acute distress HEENT:  EOMI, sclera anicteric Neck:     No masses; no thyromegaly Lungs:    Clear to auscultation bilaterally; normal respiratory effort CV:         Regular rate and rhythm; no murmurs Abd:      + bowel sounds; soft, non-tender; no palpable masses, no distension Ext:    No edema; adequate peripheral perfusion Neuro: alert and oriented x 3 Psych: normal mood and affect   BAL cell count with monocyte, macrophage predominance.  Cytology negative.  Cultures are negative to date.  Echocardiogram on 6/20 with LVEF 65-70%, grade 1 diastolic dysfunction.  Normal RV size and function, TAPSE 1.9.  No atrial level shunts noted by color-flow Doppler RHC on 5/13 with no pulmonary hypertension.  Normal right and left heart filling pressures Bubble study  Resolved problem list   Assessment and Plan  Acute on chronic hypoxic respiratory failure Chronic hypersensitivity pneumonitis  Chronic hypersensitivity pneumonitis, with recent exacerbation. Symptoms include decreased oxygen  saturation upon exertion. Previous treatments with doxycycline  and steroids were effective, but recent exacerbations have been resistant to multiple antibiotic and steroid cycles. Concerns about  progression of interstitial lung disease the CT looks stable.  No evidence of pulmonary hypertension or RV dysfunction on echocardiogram 6/20 and right heart cath 5/13.  No atrial shunt.  Respiratory virus panel is negative and no evidence of infection by procalcitonin and negative BAL cultures to date   - CellCept  dose increased and given Rituxan  infusion on 7/3 - Change IV solumedrol to prednisone  60mg /day on 7/5.  Continue Bactrim  for prophylaxis.  - Stop antibiotics for CAP coverage as she has received 5 days of treatment. - PT, out of bed, mobilize - Outpatient Referral is pending for pulmonary transplant at [facility]  Stable for discharge from our viewpoint.  Continue prednisone  at 60 mg/day until she can be seen back in clinic.  Will also arrange for second dose of Rituxan  as an outpatient.  Bright red blood per rectum Secondary to anal fissure, hemorrhoids  Pulmonary embolism, low clot burden with no evidence of right heart strain Low clot burden is unlikely to explain all her symptoms.  Resume Eliquis  when cleared by GI No lower extremity DVT.  CT chest abdomen pelvis reviewed with no evidence of occult malignancy  Insulin -dependent diabetes mellitus  Insulin -dependent diabetes mellitus secondary to chronic steroid use. Concerns about blood sugar management with increased steroid dosage. Hospitalization will require frequent monitoring and adjustment of insulin .  - Monitor blood sugar every four hours while hospitalized  - Adjust insulin  as needed    Vision changes > improved Vision changes characterized by blurriness and difficulty focusing. Ophthalmologist evaluation ruled out diabetic retinopathy. Possible association with fluctuating blood sugar levels and steroid use.  - Monitor vision changes in relation to blood sugar and steroid levels   Best Practice (right click and Reselect all SmartList Selections daily)   Per primary   Signature:   Jatavion Peaster MD [place]  Pulmonary & Critical care See Amion for pager  If no response to pager , please call [phone] until 7pm After 7:00 pm call Elink  [phone] 05/28/2024, 12:11 PM

## 2024-05-29 ENCOUNTER — Telehealth: Payer: Self-pay | Admitting: Internal Medicine

## 2024-05-29 ENCOUNTER — Encounter (HOSPITAL_COMMUNITY): Payer: Self-pay | Admitting: Internal Medicine

## 2024-05-29 DIAGNOSIS — K648 Other hemorrhoids: Secondary | ICD-10-CM

## 2024-05-29 DIAGNOSIS — I2699 Other pulmonary embolism without acute cor pulmonale: Secondary | ICD-10-CM | POA: Diagnosis not present

## 2024-05-29 DIAGNOSIS — Z860100 Personal history of colon polyps, unspecified: Secondary | ICD-10-CM

## 2024-05-29 DIAGNOSIS — J9621 Acute and chronic respiratory failure with hypoxia: Secondary | ICD-10-CM

## 2024-05-29 DIAGNOSIS — K602 Anal fissure, unspecified: Secondary | ICD-10-CM

## 2024-05-29 DIAGNOSIS — J849 Interstitial pulmonary disease, unspecified: Secondary | ICD-10-CM

## 2024-05-29 DIAGNOSIS — Z7901 Long term (current) use of anticoagulants: Secondary | ICD-10-CM

## 2024-05-29 DIAGNOSIS — E876 Hypokalemia: Secondary | ICD-10-CM | POA: Insufficient documentation

## 2024-05-29 LAB — COMPREHENSIVE METABOLIC PANEL WITH GFR
ALT: 45 U/L — ABNORMAL HIGH (ref 0–44)
AST: 25 U/L (ref 15–41)
Albumin: 3.1 g/dL — ABNORMAL LOW (ref 3.5–5.0)
Alkaline Phosphatase: 35 U/L — ABNORMAL LOW (ref 38–126)
Anion gap: 13 (ref 5–15)
BUN: 31 mg/dL — ABNORMAL HIGH (ref 8–23)
CO2: 24 mmol/L (ref 22–32)
Calcium: 8.6 mg/dL — ABNORMAL LOW (ref 8.9–10.3)
Chloride: 99 mmol/L (ref 98–111)
Creatinine, Ser: 0.83 mg/dL (ref 0.44–1.00)
GFR, Estimated: >60 mL/min (ref >60)
Glucose, Bld: 301 mg/dL — ABNORMAL HIGH (ref 70–99)
Potassium: 3.3 mmol/L — ABNORMAL LOW (ref 3.5–5.1)
Sodium: 136 mmol/L (ref 135–145)
Total Bilirubin: 1 mg/dL (ref 0.0–1.2)
Total Protein: 5.6 g/dL — ABNORMAL LOW (ref 6.5–8.1)

## 2024-05-29 LAB — CBC
HCT: 41.5 % (ref 36.0–46.0)
Hemoglobin: 13.2 g/dL (ref 12.0–15.0)
MCH: 28.2 pg (ref 26.0–34.0)
MCHC: 31.8 g/dL (ref 30.0–36.0)
MCV: 88.7 fL (ref 80.0–100.0)
Platelets: 169 K/uL (ref 150–400)
RBC: 4.68 MIL/uL (ref 3.87–5.11)
RDW: 18 % — ABNORMAL HIGH (ref 11.5–15.5)
WBC: 13.6 K/uL — ABNORMAL HIGH (ref 4.0–10.5)
nRBC: 0 % (ref 0.0–0.2)

## 2024-05-29 LAB — GLUCOSE, CAPILLARY
Glucose-Capillary: 242 mg/dL — ABNORMAL HIGH (ref 70–99)
Glucose-Capillary: 275 mg/dL — ABNORMAL HIGH (ref 70–99)
Glucose-Capillary: 281 mg/dL — ABNORMAL HIGH (ref 70–99)
Glucose-Capillary: 322 mg/dL — ABNORMAL HIGH (ref 70–99)

## 2024-05-29 MED ORDER — PREDNISONE 20 MG PO TABS
60.0000 mg | ORAL_TABLET | Freq: Every day | ORAL | 0 refills | Status: AC
  Filled 2024-05-29: qty 90, 30d supply, fill #0

## 2024-05-29 MED ORDER — APIXABAN 5 MG PO TABS
5.0000 mg | ORAL_TABLET | Freq: Two times a day (BID) | ORAL | Status: DC
  Administered 2024-05-29 – 2024-05-30 (×3): 5 mg via ORAL
  Filled 2024-05-29 (×3): qty 1

## 2024-05-29 MED ORDER — HYDROCORTISONE ACETATE 25 MG RE SUPP
25.0000 mg | Freq: Every day | RECTAL | 0 refills | Status: DC
  Filled 2024-05-29: qty 12, 12d supply, fill #0

## 2024-05-29 MED ORDER — ALBUTEROL SULFATE (2.5 MG/3ML) 0.083% IN NEBU
2.5000 mg | INHALATION_SOLUTION | Freq: Four times a day (QID) | RESPIRATORY_TRACT | 0 refills | Status: AC | PRN
  Filled 2024-05-29: qty 360, 30d supply, fill #0

## 2024-05-29 MED ORDER — MYCOPHENOLATE MOFETIL 500 MG PO TABS
1000.0000 mg | ORAL_TABLET | Freq: Two times a day (BID) | ORAL | 0 refills | Status: DC
  Filled 2024-05-29: qty 120, 30d supply, fill #0

## 2024-05-29 MED ORDER — PREDNISONE 50 MG PO TABS
60.0000 mg | ORAL_TABLET | Freq: Every day | ORAL | Status: DC
  Administered 2024-05-30: 60 mg via ORAL
  Filled 2024-05-29: qty 1

## 2024-05-29 MED ORDER — POTASSIUM CHLORIDE 20 MEQ PO PACK
40.0000 meq | PACK | ORAL | Status: AC
  Administered 2024-05-29 (×2): 40 meq via ORAL
  Filled 2024-05-29 (×2): qty 2

## 2024-05-29 MED ORDER — NITROGLYCERIN 2 % TD OINT
0.2500 [in_us] | TOPICAL_OINTMENT | Freq: Two times a day (BID) | TRANSDERMAL | 0 refills | Status: AC
  Filled 2024-05-29: qty 30, 60d supply, fill #0

## 2024-05-29 MED ORDER — ARFORMOTEROL TARTRATE 15 MCG/2ML IN NEBU
15.0000 ug | INHALATION_SOLUTION | Freq: Two times a day (BID) | RESPIRATORY_TRACT | 0 refills | Status: DC
  Filled 2024-05-29: qty 120, 30d supply, fill #0

## 2024-05-29 MED ORDER — BUDESONIDE 0.5 MG/2ML IN SUSP
0.5000 mg | Freq: Two times a day (BID) | RESPIRATORY_TRACT | 0 refills | Status: DC
  Filled 2024-05-29: qty 120, 30d supply, fill #0

## 2024-05-29 MED ORDER — APIXABAN 5 MG PO TABS
5.0000 mg | ORAL_TABLET | Freq: Two times a day (BID) | ORAL | 0 refills | Status: DC
  Filled 2024-05-29: qty 60, 30d supply, fill #0

## 2024-05-29 MED ORDER — REVEFENACIN 175 MCG/3ML IN SOLN
175.0000 ug | Freq: Every day | RESPIRATORY_TRACT | 0 refills | Status: DC
  Filled 2024-05-29: qty 90, 30d supply, fill #0

## 2024-05-29 NOTE — Assessment & Plan Note (Signed)
 05-29-2024 had colonoscopy on 05-28-2024 with removal of several polyps. GI feels that rectal bleeding from anal fissure vs hemorrhoids.  05-30-2024 f/u with GI for biopsy results

## 2024-05-29 NOTE — Assessment & Plan Note (Signed)
 05-29-2024 pt weaned down to 2 L/min. Pt has home O2 and home nebulizer machine.  05-30-2024 stable on 2 L/min. Ready for DC.

## 2024-05-29 NOTE — Progress Notes (Cosign Needed Addendum)
 [facility] Progress Note  CC: Rectal bleeding  Subjective: She is sitting up in the chair eating lunch. She is tolerating a heart healthy diet.  She denies having any nausea or vomiting.  No abdominal pain.  She passed a few brown loose stools yesterday with a few flecks or threads of red blood without further recurrence.  No chest pain.  No shortness of breath at this time.  RN at the bedside.   Objective:   Colonoscopy 05/28/2024: - Anal fissure found on perianal exam.  - One 9 mm polyp in the cecum, removed with a cold snare. Resected and retrieved. Clip (MR conditional) was placed. Clip manufacturer: [employer].  - Two 5 to 6 mm polyps in the ascending colon, removed with a cold snare. Resected and retrieved.  - Three 3 to 6 mm polyps in the transverse colon, removed with a cold snare. Resected and retrieved.  - One 6 mm polyp in the descending colon, removed with a cold snare. Resected and retrieved.  - Two 4 to 5 mm polyps in the sigmoid colon, removed with a cold snare. Resected and retrieved. - Two 3 to 6 mm polyps at the recto-sigmoid colon, removed with a cold snare. Resected and retrieved.  - One 10 mm polyp in the proximal rectum, removed with a hot snare. Resected and retrieved. Clips (MR conditional) were placed. Clip manufacturer: - Internal hemorrhoids.   Vital signs in last 24 hours: Temp:  [97.8 F (36.6 C)] 97.8 F (36.6 C) (07/07 0514) Pulse Rate:  [87-105] 105 (07/07 0514) Resp:  [15-20] 18 (07/07 0514) BP: (134-168)/(74-98) 134/98 (07/07 0514) SpO2:  [90 %-97 %] 90 % (07/07 1033) Last BM Date : 05/27/24 General: 73 year old female in no acute distress. Heart: Heart rhythm slightly irregular with inspiration only otherwise regular.  No murmur. Pulm: Breath sounds clear on oxygen  2 L nasal cannula. Abdomen: Soft, nondistended.  Nontender.  No palpable mass. Extremities:  Trace bilateral LE edema.  Neurologic:  Alert and  oriented x 4.  Grossly normal neurologically. Psych:  Alert and cooperative. Normal mood and affect.  Intake/Output from previous day: 07/06 0701 - 07/07 0700 In: 341.2 [I.V.:341.2] Out: -  Intake/Output this shift: Total I/O In: 240 [P.O.:240] Out: -   Lab Results: Recent Labs    05/27/24 0632 05/28/24 0425 05/29/24 0908  WBC 17.7* 12.4* 13.6*  HGB 13.8 12.5 13.2  HCT 45.0 39.9 41.5  PLT 197 144* 169   BMET Recent Labs    05/27/24 0632 05/28/24 0425  NA 139 139  K 3.9 3.2*  CL 99 99  CO2 28 29  GLUCOSE 177* 148*  BUN 25* 21  CREATININE 0.93 0.61  CALCIUM  8.8* 8.2*   LFT Recent Labs    05/28/24 0425  ALBUMIN 2.8*   PT/INR No results for input(s): LABPROT, INR in the last 72 hours. Hepatitis Panel No results for input(s): HEPBSAG, HCVAB, HEPAIGM, HEPBIGM in the last 72 hours.  No results found.  Patient Profile: 73 y.o. female with a history of hypersensitivity pneumonitis related to interstitial lung disease on chronic steroids and now CellCept , CAD, hypertension, hyperlipidemia, steroid-induced diabetes, GERD, and now small PE who was admitted 05/22/2024 with acute on chronic hypoxic respiratory failure and now noted to have rectal bleeding.   Assessment / Plan:  Rectal bleeding with need for DOAC/internal hemorrhoid and suspected fissure. Colonoscopy 7/6 showed an anal fissure, one 9 mm polyp removed from the cecum and clip was placed, 10  polyps measuring 3 to 6 mm removed from the colon and one 10 mm polyp removed from the rectum and clip was placed. Internal hemorrhoids also noted. She passed a few loose brown stools with a few specks/threads of red blood yesterday post colonoscopy without further recurrence. Stable Hg 12.5 -> 13.2.   -- Hydrocortisone  suppository for symptomatic internal hemorrhoids x 5 nights -- Nitroglycerin  ointment bid to the anal canal for fissure -- RectiCare 5% lidocaine  to the anal canal every 2-3 hours as needed for anorectal  pain --Ok to resume DOAC today in setting of acute PE per Dr. Albertus, refer to colonoscopy procedure report. Monitor patient closely for recurrent bleeding (though hemorrhoidal bleeding may persist). --Colace stool softener 100 mg daily to avoid straining --Hydrocortisone  supp 25 mg PR x 5 nights.  --Await path results   Acute on chronic respiratory failure/chronic hypersensitivity pneumonitis.  WBC 13.6. -- Steroids, CellCept  and recent Rituxan  --per pulmonary medicine -- She has completed antibiotics and oxygen  has been able to be weaned   Pulmonary embolism with low clot burden no right heart strain. Eliquis  on hold.  -- See DOAC recommendations above   GERD -- Continue Pantoprazole  40 mg daily    Principal Problem:   Acute hypoxic respiratory failure (HCC) Active Problems:   Gastroesophageal reflux disease   Hypoxemia   Pulmonary fibrosis (HCC)   Interstitial lung disease (HCC)   Acute pulmonary embolism (HCC)   Rectal bleeding   Anal fissure   Internal hemorrhoid, bleeding   Benign neoplasm of cecum   Benign neoplasm of ascending colon   Benign neoplasm of transverse colon   Benign neoplasm of descending colon   Benign neoplasm of sigmoid colon   Benign neoplasm of rectosigmoid junction   Benign neoplasm of rectum     LOS: 7 days   Elida HERO Kennedy-Smith  05/29/2024, 11:14 AM

## 2024-05-29 NOTE — Assessment & Plan Note (Addendum)
 05-29-2024 PCCM signed off yesterday. On 60 mg prednisone  daily. F/u with PCCM in outpatient clinic. Continue with cellcept   05-30-2024 f/u with PCCM as outpatient. Continue prednisone  60 mg daily per Dr. Theophilus and cellcept  1000 mg bid. Pt states she feels she is breathing better with nebulizer meds instead of Breo inhaler. Will DC to home on pulmicort , Yulperi, Brovana  nebs.

## 2024-05-29 NOTE — Progress Notes (Addendum)
 PROGRESS NOTE    Rhonda [name]  FMW:[id number] DOB: May 27, 1951 DOA: 05/22/2024 PCP: Geronimo Amel, MD  Subjective: Pt seen and examined. GI has given clearance to restart DOAC. Pt wants to go home tomorrow AM.    Hospital Course: 73 y.o. female with interstitial lung disease, CAD, GERD, HTN, HLP chronic hypersensitive pneumonitis on CellCept  prednisone , presented to ED due to shortness of breath,. In ED afebrile hemodynamically stable.  PCCM consulted and admitted, Labs were obtained which showed glucose 234, creat 0.89, WBC 13.5, hemoglobin 13.7, troponin within normal limits, BNP 89, respiratory viral panel negative.  Lactic acid 2.1.  CXR>no acute infiltrates. CTA> Small bilateral Pes-heparin  drip started S/P bronchoscopy on 7/1.  7/4: Hemodynamically stable.  Developed bleeding per rectum, had 2 episodes this morning.  GI was consulted and holding Eliquis .  Hemoglobin decreased to 12.5 from 13.2 today. Received rituximab  infusion yesterday.  7/5: Hemodynamically stable, some improvement of hemoglobin to 13.8 but she continued to have bleeding per rectum.  GI is planning colonoscopy tomorrow morning.  We will keep holding Eliquis  until cleared from GI.  7/6: Vital stable with hemoglobin of 12.5 this morning.  Potassium 3.2 which is being repleted.  Colonoscopy today with anal fissure, internal hemorrhoids and multiple polyps in colon which were removed and sent for pathology. Pulmonary to continue Solu-Medrol  at 60 mg daily and 1 g of CellCept  and they will follow-up as outpatient  Subjective: Patient was seen after the colonoscopy.  Still having some bleeding per rectum.    Assessment and plan:  AoCHRF ILD flareup  Small bilateral PE-low clot burden-dvt study neg Chronic hypersensitive pneumonitis: Patient on chronic steroids and CellCept  at baseline presented with shortness of breath, CT imaging shows small bilateral PE, concern for ILD flareup PCCM following.  S/p  bronchoscopy 7/1, follow BAL studies. F/u culture, cytology.Cell count with monocyte, macrophage predominance. antibiotic with ceftriaxone , azithromycin ,  Received high-dose Solu-Medrol  and rituximab  Continuing steroid Pulmonary is on board CellCept  dose was increased and continuing Bactrim  Continue supple oxygen  to keep above 92%, continue plan of care as per pulmonary CT chest abdomen pelvis-7/2 no lymphadenopathy or metastatic disease Cont to Increase mobility with PT, mobilize  Bleeding per rectum.  Patient continued to have some bleeding per rectum, small decrease in hemoglobin to 12.5 from 13.2 initially and now seems stable at 13.8.  Patient was recently started on Eliquis . - Holding Eliquis   -GI was consulted-s/p colonoscopy today with anal fissure, internal hemorrhoid and multiple colonic polyps which were removed and sent for pathology. -Keep holding Eliquis  until cleared from GI   Diabetes mellitus type 2, uncontrolled with hyperglycemia: Uncontrolled in the setting of steroid A1c 7.2 on Semglee  15 units daily, SSI, monitor and adjust   Hyperlipidemia Continue Zetia , statin   Hypertension Stable, continue diltiazem , Toprol  XL   Vision changes with blurriness: Possibly due to hyperglycemia with steroid use ophthalmology evaluation rule out diabetic retinopathy.  Monitor   Assessment and Plan: * Acute on chronic respiratory failure with hypoxia (HCC) 05-29-2024 pt weaned down to 2 L/min. Pt has home O2 and home nebulizer machine.  Acute pulmonary embolism (HCC) 05-29-2024 per GI, ok for pt to restart DOAC today. Pt wants to go home tomorrow. Repeat CBC in AM.  Internal hemorrhoid, bleeding 05-29-2024 continue with rectal NTG ointment to help with healing.  Anal fissure 05-29-2024 continue with rectal NTG ointment to help with healing.  Rectal bleeding 05-29-2024 GI feels that rectal bleeding from anal fissure vs hemorrhoids.   Benign neoplasm  of rectum 05-29-2024  had colonoscopy on 05-28-2024 with removal of several polyps. GI feels that rectal bleeding from anal fissure vs hemorrhoids.  Benign neoplasm of rectosigmoid junction 05-29-2024 had colonoscopy on 05-28-2024 with removal of several polyps. GI feels that rectal bleeding from anal fissure vs hemorrhoids.  Benign neoplasm of sigmoid colon 05-29-2024 had colonoscopy on 05-28-2024 with removal of several polyps. GI feels that rectal bleeding from anal fissure vs hemorrhoids.  Benign neoplasm of descending colon 05-29-2024 had colonoscopy on 05-28-2024 with removal of several polyps. GI feels that rectal bleeding from anal fissure vs hemorrhoids.  Benign neoplasm of transverse colon 05-29-2024 had colonoscopy on 05-28-2024 with removal of several polyps. GI feels that rectal bleeding from anal fissure vs hemorrhoids.  Benign neoplasm of ascending colon 05-29-2024 had colonoscopy on 05-28-2024 with removal of several polyps. GI feels that rectal bleeding from anal fissure vs hemorrhoids.  Benign neoplasm of cecum 05-29-2024 had colonoscopy on 05-28-2024 with removal of several polyps. GI feels that rectal bleeding from anal fissure vs hemorrhoids.  Interstitial lung disease (HCC) 05-29-2024 PCCM signed off yesterday. On 60 mg prednisone  daily. F/u with PCCM in outpatient clinic. Continue with cellcept   Gastroesophageal reflux disease 05-29-2024 stable. On po protonix .  Hypokalemia 05-29-2024 replete with po kcl   DVT prophylaxis: SCDs Start: 05/22/24 1756 apixaban  (ELIQUIS ) tablet 5 mg     Code Status: Full Code Family Communication: no family at bedside. Pt is decisional. Disposition Plan: return home Reason for continuing need for hospitalization: medically stable for DC today. Pt declined DC today but wants to go home tomorrow.  Objective: Vitals:   05/29/24 0514 05/29/24 0912 05/29/24 1033 05/29/24 1401  BP: (!) 134/98   133/73  Pulse: (!) 105   84  Resp: 18   19  Temp: 97.8 F (36.6 C)    98.4 F (36.9 C)  TempSrc:      SpO2: 95% 96% 90% 97%  Weight:      Height:        Intake/Output Summary (Last 24 hours) at 05/29/2024 1647 Last data filed at 05/29/2024 0917 Gross per 24 hour  Intake 240 ml  Output --  Net 240 ml   Filed Weights   05/23/24 0856 05/23/24 1309 05/28/24 0911  Weight: 68.2 kg 67.1 kg 67.1 kg    Examination:  Physical Exam Vitals and nursing note reviewed.  Constitutional:      General: She is not in acute distress.    Appearance: She is obese. She is not toxic-appearing or diaphoretic.  HENT:     Head: Normocephalic and atraumatic.     Nose: Nose normal.  Cardiovascular:     Rate and Rhythm: Normal rate and regular rhythm.  Pulmonary:     Effort: Pulmonary effort is normal. No respiratory distress.     Comments: Dry rales Abdominal:     General: Bowel sounds are normal.     Palpations: Abdomen is soft.  Musculoskeletal:     Right lower leg: No edema.     Left lower leg: No edema.  Skin:    General: Skin is warm and dry.     Capillary Refill: Capillary refill takes less than 2 seconds.  Neurological:     General: No focal deficit present.     Mental Status: She is oriented to person, place, and time.     Data Reviewed: I have personally reviewed following labs and imaging studies  CBC: Recent Labs  Lab 05/25/24 0527 05/26/24 1045 05/27/24 9367  05/28/24 0425 05/29/24 0908  WBC 20.3* 16.2* 17.7* 12.4* 13.6*  HGB 13.1 12.5 13.8 12.5 13.2  HCT 42.7 40.8 45.0 39.9 41.5  MCV 91.6 90.9 90.7 88.3 88.7  PLT 195 182 197 144* 169   Basic Metabolic Panel: Recent Labs  Lab 05/25/24 0527 05/26/24 0337 05/27/24 0632 05/28/24 0425 05/29/24 0908  NA 137 137 139 139 136  K 4.3 4.3 3.9 3.2* 3.3*  CL 103 101 99 99 99  CO2 27 25 28 29 24   GLUCOSE 228* 217* 177* 148* 301*  BUN 27* 30* 25* 21 31*  CREATININE 0.91 0.67 0.93 0.61 0.83  CALCIUM  8.8* 8.5* 8.8* 8.2* 8.6*  MG  --   --   --  2.6*  --   PHOS 3.9 3.3 4.3 3.8  --     GFR: Estimated Creatinine Clearance: 48.8 mL/min (by C-G formula based on SCr of 0.83 mg/dL). Liver Function Tests: Recent Labs  Lab 05/25/24 0527 05/26/24 0337 05/27/24 0632 05/28/24 0425 05/29/24 0908  AST  --   --   --   --  25  ALT  --   --   --   --  45*  ALKPHOS  --   --   --   --  35*  BILITOT  --   --   --   --  1.0  PROT  --   --   --   --  5.6*  ALBUMIN 3.1* 2.8* 3.1* 2.8* 3.1*   BNP (last 3 results) Recent Labs    03/14/24 1248 03/16/24 0550 05/22/24 1302  BNP 60.8 77.5 89.7   CBG: Recent Labs  Lab 05/28/24 1707 05/28/24 2037 05/29/24 0722 05/29/24 1125 05/29/24 1637  GLUCAP 253* 86 242* 275* 281*   Recent Results (from the past 240 hours)  Culture, blood (routine x 2)     Status: None   Collection Time: 05/22/24 12:50 PM   Specimen: BLOOD  Result Value Ref Range Status   Specimen Description   Final    BLOOD LEFT ANTECUBITAL Performed at [facility], [address] [place], [place] [address]    Special Requests   Final    BOTTLES DRAWN AEROBIC AND ANAEROBIC Blood Culture results may not be optimal due to an inadequate volume of blood received in culture bottles Performed at [facility], [address], [place], [place] [address]    Culture   Final    NO GROWTH 5 DAYS Performed at [facility], [address], [place], [place] [address]    Report Status 05/27/2024 FINAL  Final  Culture, blood (routine x 2)     Status: None   Collection Time: 05/22/24  1:27 PM   Specimen: BLOOD  Result Value Ref Range Status   Specimen Description   Final    BLOOD BLOOD LEFT FOREARM Performed at [facility], [address], [place], [place] [address]    Special Requests   Final    BOTTLES DRAWN AEROBIC AND ANAEROBIC Blood Culture results may not be optimal due to an inadequate volume of blood received in culture bottles Performed at [facility], [address]  [place], [place] [address]    Culture   Final    NO GROWTH 5 DAYS Performed at [facility], [address] [place], [place] [address]    Report Status 05/27/2024 FINAL  Final  Respiratory (~20 pathogens) panel by PCR  Status: None   Collection Time: 05/22/24  4:12 PM   Specimen: Nasopharyngeal Swab; Respiratory  Result Value Ref Range Status   Adenovirus NOT DETECTED NOT DETECTED Final   Coronavirus 229E NOT DETECTED NOT DETECTED Final    Comment: (NOTE) The Coronavirus on the Respiratory Panel, DOES NOT test for the novel  Coronavirus (2019 nCoV)    Coronavirus HKU1 NOT DETECTED NOT DETECTED Final   Coronavirus NL63 NOT DETECTED NOT DETECTED Final   Coronavirus OC43 NOT DETECTED NOT DETECTED Final   Metapneumovirus NOT DETECTED NOT DETECTED Final   Rhinovirus / Enterovirus NOT DETECTED NOT DETECTED Final   Influenza A NOT DETECTED NOT DETECTED Final   Influenza B NOT DETECTED NOT DETECTED Final   Parainfluenza Virus 1 NOT DETECTED NOT DETECTED Final   Parainfluenza Virus 2 NOT DETECTED NOT DETECTED Final   Parainfluenza Virus 3 NOT DETECTED NOT DETECTED Final   Parainfluenza Virus 4 NOT DETECTED NOT DETECTED Final   Respiratory Syncytial Virus NOT DETECTED NOT DETECTED Final   Bordetella pertussis NOT DETECTED NOT DETECTED Final   Bordetella Parapertussis NOT DETECTED NOT DETECTED Final   Chlamydophila pneumoniae NOT DETECTED NOT DETECTED Final   Mycoplasma pneumoniae NOT DETECTED NOT DETECTED Final    Comment: Performed at [facility], [address], [place], [place] [address]  Pneumocystis smear by DFA     Status: None   Collection Time: 05/23/24  2:11 PM   Specimen: Bronchial Alveolar Lavage; Respiratory  Result Value Ref Range Status   Specimen Source-PJSRC BRONCHIAL ALVEOLAR LAVAGE  Final    Comment: RUL Performed at [facility], [address], [place], [place] [address]    Pneumocystis jiroveci Ag NEGATIVE  Final    Comment: Performed at [facility], [address] [place], [place] [address]  Fungus Culture With Stain     Status: None (Preliminary result)   Collection Time: 05/23/24  2:11 PM   Specimen: Bronchial Alveolar Lavage; Respiratory  Result Value Ref Range Status   Fungus Stain Final report  Final    Comment: (NOTE) Performed At: [facility] [address] [place], [place] [id number] Jennette Shorter MD Ey:[id number]    Fungus (Mycology) Culture PENDING  Incomplete   Fungal Source BRONCHIAL ALVEOLAR LAVAGE  Final    Comment: RUL Performed at [facility], [address], [place], [place] [address]   Culture, BAL-quantitative w Gram Stain     Status: None   Collection Time: 05/23/24  2:11 PM   Specimen: Bronchial Alveolar Lavage; Respiratory  Result Value Ref Range Status   Specimen Description BRONCHIAL ALVEOLAR LAVAGE  Final   Special Requests RUL  Final   Gram Stain   Final    RARE WBC PRESENT, PREDOMINANTLY PMN NO ORGANISMS SEEN    Culture   Final    NO GROWTH 2 DAYS Performed at [facility], [address] [place], [place] [address]    Report Status 05/26/2024 FINAL  Final  Acid Fast Smear (AFB)     Status: None   Collection Time: 05/23/24  2:11 PM   Specimen: Bronchial Alveolar Lavage; Respiratory  Result Value Ref Range Status   AFB Specimen Processing Concentration  Final   Acid Fast Smear Negative  Final    Comment: (NOTE) Performed At: [facility] [address] [place], [place] [id number] Jennette Shorter MD Ey:[id number]    Source (AFB) BRONCHIAL ALVEOLAR LAVAGE  Final    Comment: RUL Performed at  [facility], [address], [place], [place] [address]   Anaerobic culture w Gram Stain     Status: None   Collection Time: 05/23/24  2:11 PM   Specimen: Bronchoalveolar Lavage  Result Value Ref Range Status   Specimen Description BRONCHIAL ALVEOLAR LAVAGE  Final   Special Requests RUL  Final   Gram Stain   Final    RARE WBC PRESENT,  PREDOMINANTLY PMN NO ORGANISMS SEEN    Culture   Final    NO ANAEROBES ISOLATED Performed at [facility], [address] [place], [place] [address]    Report Status 05/28/2024 FINAL  Final  Fungus Culture Result     Status: None   Collection Time: 05/23/24  2:11 PM  Result Value Ref Range Status   Result 1 Comment  Final    Comment: (NOTE) KOH/Calcofluor preparation:  no fungus observed. Performed At: [facility] [address] [place], [place] [id number] Jennette Shorter MD Ey:[id number]   Fungus Culture With Stain     Status: None (Preliminary result)   Collection Time: 05/23/24  2:17 PM   Specimen: Bronchial Alveolar Lavage; Respiratory  Result Value Ref Range Status   Fungus Stain Final report  Final    Comment: (NOTE) Performed At: [facility] [address] [place], [place] [id number] Jennette Shorter MD Ey:[id number]    Fungus (Mycology) Culture PENDING  Incomplete   Fungal Source BRONCHIAL ALVEOLAR LAVAGE  Final    Comment: LUL Performed at [facility], [address], [place], [place] [address]   Culture, BAL-quantitative w Gram Stain     Status: None   Collection Time: 05/23/24  2:17 PM   Specimen: Bronchial Alveolar Lavage; Respiratory  Result Value Ref Range Status   Specimen Description   Final    BRONCHIAL ALVEOLAR LAVAGE Performed at [facility], [address] [place], [place] [address]    Special Requests LUL  Final   Gram Stain   Final    RARE WBC PRESENT, PREDOMINANTLY PMN NO ORGANISMS SEEN    Culture   Final    NO GROWTH 2 DAYS Performed at [facility], [address] [place], [place] [address]    Report Status 05/26/2024 FINAL  Final  Acid Fast Smear (AFB)     Status: None   Collection Time: 05/23/24  2:17 PM   Specimen: Bronchial Alveolar Lavage; Respiratory  Result Value Ref Range Status   AFB Specimen Processing Concentration  Final   Acid Fast Smear Negative  Final    Comment:  (NOTE) Performed At: [facility] [address] [place], [place] [id number] Jennette Shorter MD Ey:[id number]    Source (AFB) BRONCHIAL ALVEOLAR LAVAGE  Final    Comment: LUL Performed at [facility], [address] [place], [place] [address]   Anaerobic culture w Gram Stain     Status: None   Collection Time: 05/23/24  2:17 PM   Specimen: Bronchoalveolar Lavage  Result Value Ref Range Status   Specimen Description BRONCHIAL ALVEOLAR LAVAGE  Final   Special Requests LUL  Final   Gram Stain   Final    RARE WBC PRESENT, PREDOMINANTLY PMN NO ORGANISMS SEEN    Culture   Final    NO ANAEROBES ISOLATED Performed at [facility], [address], [place], [place] [address]    Report Status 05/28/2024 FINAL  Final  Fungus Culture Result     Status: None  Collection Time: 05/23/24  2:17 PM  Result Value Ref Range Status   Result 1 Comment  Final    Comment: (NOTE) KOH/Calcofluor preparation:  no fungus observed. Performed At: [facility] [address] [place], [place] [id number] Nagendra Sanjai MD Ey:[id number]     Scheduled Meds:  apixaban   5 mg Oral BID   arformoterol   15 mcg Nebulization BID   budesonide   0.5 mg Nebulization BID   Chlorhexidine  Gluconate Cloth  6 each Topical Daily   dicyclomine   10 mg Oral BID AC   diltiazem   120 mg Oral Daily   famotidine   20 mg Oral BID   hydrocortisone   25 mg Rectal QHS   insulin  aspart  0-24 Units Subcutaneous TID WC   insulin  glargine-yfgn  15 Units Subcutaneous Daily   metoprolol  succinate  25 mg Oral Daily   mycophenolate   1,000 mg Oral BID   nitroGLYCERIN   0.5 inch Topical BID   pantoprazole   40 mg Oral Daily   pravastatin   40 mg Oral QPM   [START ON 05/30/2024] predniSONE   60 mg Oral Q breakfast   revefenacin   175 mcg Nebulization Daily   sodium chloride  flush  10-40 mL Intracatheter Q12H   sulfamethoxazole -trimethoprim   1 tablet Oral Once per day on Monday Wednesday Friday   Continuous  Infusions:  sodium chloride        LOS: 7 days   Time spent: 55 minutes  Camellia Door, DO  [facility]  05/29/2024, 4:47 PM

## 2024-05-29 NOTE — Progress Notes (Signed)
 Mobility Specialist - Progress Note   05/29/24 1033  Oxygen  Therapy  SpO2 90 %  O2 Device Nasal Cannula  O2 Flow Rate (L/min) 2 L/min  Patient Activity (if Appropriate) Ambulating  Mobility  Activity Ambulated independently in hallway  Level of Assistance Independent  Assistive Device None  Distance Ambulated (ft) 500 ft  Activity Response Tolerated well  Mobility Referral Yes  Mobility visit 1 Mobility  Mobility Specialist Start Time (ACUTE ONLY) 1016  Mobility Specialist Stop Time (ACUTE ONLY) 1030  Mobility Specialist Time Calculation (min) (ACUTE ONLY) 14 min   Pt received in recliner and agreeable to mobility. No complaints during session. Pt to recliner after session with all needs met.    Pre-mobility: 97% SpO2 (2L [place]) During mobility: 111 HR, 90% SpO2 (2L [place]) Post-mobility: 94% SPO2 (2l [place])  Chief Technology Officer

## 2024-05-29 NOTE — Assessment & Plan Note (Signed)
 05-29-2024 replete with po kcl

## 2024-05-29 NOTE — Assessment & Plan Note (Signed)
 05-29-2024 continue with rectal NTG ointment to help with healing.  05-30-2024 stable. No further bleeding. Continue with anusol  and ntg ointment.

## 2024-05-29 NOTE — Assessment & Plan Note (Signed)
 05-29-2024 per GI, ok for pt to restart DOAC today. Pt wants to go home tomorrow. Repeat CBC in AM.  05-30-2024 restarted eliquis  5 mg bid. F/u with pulmonology to determine length of treatment. Given small PE burden, I would recommend only 3-6 months but defer final decision to PCCM.

## 2024-05-29 NOTE — Subjective & Objective (Signed)
 Pt seen and examined. Restarted eliquis . No GI bleeding. Ready to go home today.

## 2024-05-29 NOTE — Plan of Care (Signed)

## 2024-05-29 NOTE — Assessment & Plan Note (Signed)
 05-29-2024 stable. On po protonix .  05-30-2024 continue PPI

## 2024-05-29 NOTE — Assessment & Plan Note (Signed)
 05-29-2024 GI feels that rectal bleeding from anal fissure vs hemorrhoids.   05-30-2024 stable. No further bleeding. Continue with anusol  and ntg ointment.

## 2024-05-29 NOTE — Telephone Encounter (Signed)
 [name]   Rhonda [name] Rhonda [name] got Rituxan  in  hospital. Suspect Dr Theophilus was wanting D15 Rituxan  as outpatient. I am copying him but want to ensure this happens  Thanks    SIGNATURE    Dr. Dorethia Cave, M.D., F.C.C.P,  Pulmonary and Critical Care Medicine Staff Physician, [facility] Director - Interstitial Lung Disease  Program  Pulmonary Fibrosis [facility] at [facility] [place], [place], [address]   Pager: [phone], If no answer  -> Check AMION or Try [phone] Telephone (clinical office): [phone] Telephone (research): [phone]  7:32 AM 05/29/2024

## 2024-05-30 ENCOUNTER — Other Ambulatory Visit (HOSPITAL_COMMUNITY): Payer: Self-pay

## 2024-05-30 ENCOUNTER — Other Ambulatory Visit: Payer: Self-pay

## 2024-05-30 ENCOUNTER — Encounter (HOSPITAL_COMMUNITY)

## 2024-05-30 ENCOUNTER — Telehealth (HOSPITAL_COMMUNITY): Payer: Self-pay | Admitting: Pharmacy Technician

## 2024-05-30 DIAGNOSIS — K602 Anal fissure, unspecified: Secondary | ICD-10-CM | POA: Diagnosis not present

## 2024-05-30 DIAGNOSIS — J9621 Acute and chronic respiratory failure with hypoxia: Secondary | ICD-10-CM | POA: Diagnosis not present

## 2024-05-30 DIAGNOSIS — K649 Unspecified hemorrhoids: Secondary | ICD-10-CM

## 2024-05-30 DIAGNOSIS — Z7901 Long term (current) use of anticoagulants: Secondary | ICD-10-CM

## 2024-05-30 DIAGNOSIS — K625 Hemorrhage of anus and rectum: Secondary | ICD-10-CM

## 2024-05-30 DIAGNOSIS — Z860100 Personal history of colon polyps, unspecified: Secondary | ICD-10-CM

## 2024-05-30 DIAGNOSIS — I2699 Other pulmonary embolism without acute cor pulmonale: Secondary | ICD-10-CM | POA: Diagnosis not present

## 2024-05-30 LAB — BASIC METABOLIC PANEL WITH GFR
Anion gap: 10 (ref 5–15)
BUN: 29 mg/dL — ABNORMAL HIGH (ref 8–23)
CO2: 28 mmol/L (ref 22–32)
Calcium: 9 mg/dL (ref 8.9–10.3)
Chloride: 98 mmol/L (ref 98–111)
Creatinine, Ser: 0.74 mg/dL (ref 0.44–1.00)
GFR, Estimated: >60 mL/min (ref >60)
Glucose, Bld: 203 mg/dL — ABNORMAL HIGH (ref 70–99)
Potassium: 5.5 mmol/L — ABNORMAL HIGH (ref 3.5–5.1)
Sodium: 136 mmol/L (ref 135–145)

## 2024-05-30 LAB — CBC WITH DIFFERENTIAL/PLATELET
Abs Immature Granulocytes: 0.22 K/uL — ABNORMAL HIGH (ref 0.00–0.07)
Basophils Absolute: 0 K/uL (ref 0.0–0.1)
Basophils Relative: 0 %
Eosinophils Absolute: 0 K/uL (ref 0.0–0.5)
Eosinophils Relative: 0 %
HCT: 39.9 % (ref 36.0–46.0)
Hemoglobin: 12.6 g/dL (ref 12.0–15.0)
Immature Granulocytes: 1 %
Lymphocytes Relative: 4 %
Lymphs Abs: 0.6 K/uL — ABNORMAL LOW (ref 0.7–4.0)
MCH: 28.4 pg (ref 26.0–34.0)
MCHC: 31.6 g/dL (ref 30.0–36.0)
MCV: 89.9 fL (ref 80.0–100.0)
Monocytes Absolute: 1.5 K/uL — ABNORMAL HIGH (ref 0.1–1.0)
Monocytes Relative: 8 %
Neutro Abs: 15.9 K/uL — ABNORMAL HIGH (ref 1.7–7.7)
Neutrophils Relative %: 87 %
Platelets: 168 K/uL (ref 150–400)
RBC: 4.44 MIL/uL (ref 3.87–5.11)
RDW: 17.7 % — ABNORMAL HIGH (ref 11.5–15.5)
WBC: 18.3 K/uL — ABNORMAL HIGH (ref 4.0–10.5)
nRBC: 0 % (ref 0.0–0.2)

## 2024-05-30 LAB — GLUCOSE, CAPILLARY: Glucose-Capillary: 216 mg/dL — ABNORMAL HIGH (ref 70–99)

## 2024-05-30 LAB — SURGICAL PATHOLOGY

## 2024-05-30 NOTE — Telephone Encounter (Signed)
 Pharmacy Patient Advocate Encounter   Received notification from Inpatient Request that prior authorization for Yupelri  175MCG/3ML solution is required/requested.   Insurance verification completed.   The patient is insured through [employer] .   Per test claim: PA required; PA submitted to above mentioned insurance via CoverMyMeds Key/confirmation #/EOC [id number] Status is pending

## 2024-05-30 NOTE — Discharge Summary (Addendum)
 [facility] Phys[facility] Discharge Summary   Patient name: Rhonda [name]  Admit date:     05/22/2024  Discharge date: 05/30/2024  Attending Physician: DENA CHARLESTON [[id number]]  Discharge Physician: Camellia Door   PCP: Geronimo Amel, MD  Admitted From: Home  Disposition:  Home  Recommendations for Outpatient Follow-up:  Follow up with PCP in 1-2 weeks Follow up with [facility] pulmonology in 1-2 weeks Follow up with [facility] in 2 weeks  Home Health:Yes. Home health PT Equipment/Devices: Oxygen  2 L/min. Already had O2 concentrator at home  Discharge Condition:Stable CODE STATUS:FULL Diet recommendation: Heart Healthy/Diabetic Fluid Restriction: None  Hospital Summary: 73 y.o. female with interstitial lung disease, CAD, GERD, HTN, HLP chronic hypersensitive pneumonitis on CellCept  prednisone , presented to ED due to shortness of breath,. In ED afebrile hemodynamically stable.  PCCM consulted and admitted, Labs were obtained which showed glucose 234, creat 0.89, WBC 13.5, hemoglobin 13.7, troponin within normal limits, BNP 89, respiratory viral panel negative.  Lactic acid 2.1.  CXR>no acute infiltrates. CTA> Small bilateral Pes-heparin  drip started S/P bronchoscopy on 7/1.  7/4: Hemodynamically stable.  Developed bleeding per rectum, had 2 episodes this morning.  GI was consulted and holding Eliquis .  Hemoglobin decreased to 12.5 from 13.2 today. Received rituximab  infusion yesterday.  7/5: Hemodynamically stable, some improvement of hemoglobin to 13.8 but she continued to have bleeding per rectum.  GI is planning colonoscopy tomorrow morning.  We will keep holding Eliquis  until cleared from GI.  7/6: Vital stable with hemoglobin of 12.5 this morning.  Potassium 3.2 which is being repleted.  Colonoscopy today with anal fissure, internal hemorrhoids and multiple polyps in colon which were removed and sent for pathology. Pulmonary to continue Solu-Medrol  at 60 mg daily and 1  g of CellCept  and they will follow-up as outpatient  Subjective: Patient was seen after the colonoscopy.  Still having some bleeding per rectum.    Assessment and plan:  AoCHRF ILD flareup  Small bilateral PE-low clot burden-dvt study neg Chronic hypersensitive pneumonitis: Patient on chronic steroids and CellCept  at baseline presented with shortness of breath, CT imaging shows small bilateral PE, concern for ILD flareup PCCM following.  S/p bronchoscopy 7/1, follow BAL studies. F/u culture, cytology.Cell count with monocyte, macrophage predominance. antibiotic with ceftriaxone , azithromycin ,  Received high-dose Solu-Medrol  and rituximab  Continuing steroid Pulmonary is on board CellCept  dose was increased and continuing Bactrim  Continue supple oxygen  to keep above 92%, continue plan of care as per pulmonary CT chest abdomen pelvis-7/2 no lymphadenopathy or metastatic disease Cont to Increase mobility with PT, mobilize  Bleeding per rectum.  Patient continued to have some bleeding per rectum, small decrease in hemoglobin to 12.5 from 13.2 initially and now seems stable at 13.8.  Patient was recently started on Eliquis . - Holding Eliquis   -GI was consulted-s/p colonoscopy today with anal fissure, internal hemorrhoid and multiple colonic polyps which were removed and sent for pathology. -Keep holding Eliquis  until cleared from GI   Diabetes mellitus type 2, uncontrolled with hyperglycemia: Uncontrolled in the setting of steroid A1c 7.2 on Semglee  15 units daily, SSI, monitor and adjust   Hyperlipidemia Continue Zetia , statin   Hypertension Stable, continue diltiazem , Toprol  XL   Vision changes with blurriness: Possibly due to hyperglycemia with steroid use ophthalmology evaluation rule out diabetic retinopathy.  Monitor  Hospital Course by Problem: * Acute on chronic respiratory failure with hypoxia (HCC) 05-29-2024 pt weaned down to 2 L/min. Pt has home O2 and home nebulizer  machine.  05-30-2024 stable  on 2 L/min. Ready for DC.  Acute pulmonary embolism (HCC) 05-29-2024 per GI, ok for pt to restart DOAC today. Pt wants to go home tomorrow. Repeat CBC in AM.  05-30-2024 restarted eliquis  5 mg bid. F/u with pulmonology to determine length of treatment. Given small PE burden, I would recommend only 3-6 months but defer final decision to PCCM.  Bleeding hemorrhoids 05-29-2024 continue with rectal NTG ointment to help with healing.  05-30-2024 stable. No further bleeding. Continue with anusol  and ntg ointment.  Anal fissure 05-29-2024 continue with rectal NTG ointment to help with healing.  05-30-2024 stable. No further bleeding. Continue with anusol  and ntg ointment.    Rectal bleeding 05-29-2024 GI feels that rectal bleeding from anal fissure vs hemorrhoids.   05-30-2024 stable. No further bleeding. Continue with anusol  and ntg ointment.  Benign neoplasm of rectum 05-29-2024 had colonoscopy on 05-28-2024 with removal of several polyps. GI feels that rectal bleeding from anal fissure vs hemorrhoids.  05-30-2024 f/u with GI for biopsy results  Benign neoplasm of rectosigmoid junction 05-29-2024 had colonoscopy on 05-28-2024 with removal of several polyps. GI feels that rectal bleeding from anal fissure vs hemorrhoids.  05-30-2024 f/u with GI for biopsy results  Benign neoplasm of sigmoid colon 05-29-2024 had colonoscopy on 05-28-2024 with removal of several polyps. GI feels that rectal bleeding from anal fissure vs hemorrhoids.  05-30-2024 f/u with GI for biopsy results  Benign neoplasm of descending colon 05-29-2024 had colonoscopy on 05-28-2024 with removal of several polyps. GI feels that rectal bleeding from anal fissure vs hemorrhoids.  05-30-2024 f/u with GI for biopsy results  Benign neoplasm of transverse colon 05-29-2024 had colonoscopy on 05-28-2024 with removal of several polyps. GI feels that rectal bleeding from anal fissure vs  hemorrhoids.  05-30-2024 f/u with GI for biopsy results  Benign neoplasm of ascending colon 05-29-2024 had colonoscopy on 05-28-2024 with removal of several polyps. GI feels that rectal bleeding from anal fissure vs hemorrhoids.  05-30-2024 f/u with GI for biopsy results  Benign neoplasm of cecum 05-29-2024 had colonoscopy on 05-28-2024 with removal of several polyps. GI feels that rectal bleeding from anal fissure vs hemorrhoids.  05-30-2024 f/u with GI for biopsy results  Interstitial lung disease (HCC) 05-29-2024 PCCM signed off yesterday. On 60 mg prednisone  daily. F/u with PCCM in outpatient clinic. Continue with cellcept   05-30-2024 f/u with PCCM as outpatient. Continue prednisone  60 mg daily per Dr. Theophilus and cellcept  1000 mg bid. Pt states she feels she is breathing better with nebulizer meds instead of Breo inhaler. Will DC to home on pulmicort , Yulperi, Brovana  nebs.  Gastroesophageal reflux disease 05-29-2024 stable. On po protonix .  05-30-2024 continue PPI  Hypokalemia 05-29-2024 replete with po kcl    Discharge Diagnoses:  Principal Problem:   Acute on chronic respiratory failure with hypoxia (HCC) Active Problems:   Acute pulmonary embolism (HCC)   Rectal bleeding   Anal fissure   Bleeding hemorrhoids   Gastroesophageal reflux disease   Interstitial lung disease (HCC)   Benign neoplasm of cecum   Benign neoplasm of ascending colon   Benign neoplasm of transverse colon   Benign neoplasm of descending colon   Benign neoplasm of sigmoid colon   Benign neoplasm of rectosigmoid junction   Benign neoplasm of rectum   Hypokalemia   Chronic anticoagulation   History of colonic polyps   Discharge Instructions  Discharge Instructions     Call MD for:  difficulty breathing, headache or visual disturbances   Complete by:  As directed    Call MD for:  extreme fatigue   Complete by: As directed    Call MD for:  hives   Complete by: As directed    Call MD for:   persistant dizziness or light-headedness   Complete by: As directed    Call MD for:  temperature >100.4   Complete by: As directed    Diet - low sodium heart healthy   Complete by: As directed    Diet Carb Modified   Complete by: As directed    Discharge instructions   Complete by: As directed    1. Follow up with your primary care provider in 1-2 weeks following discharge from hospital. 2. Follow up with [facility] pulmonology in 1-2 weeks. Office will call you for appointment. 3. Follow up with [facility] in 2 weeks for results of your biopsies.   Increase activity slowly   Complete by: As directed       Allergies as of 05/30/2024       Reactions   Ozempic (0.25 Or 0.5 Mg-dose) [semaglutide(0.25 Or 0.5mg -dos)] Other (See Comments)   Paralyzed the stomach   Tape Other (See Comments)   SKIN IS VERY FRAGILE!! Burns and pulls up the skin!! SKIN BRUISES and TEARS EASILY.SABRA   Betadine [povidone Iodine] Other (See Comments)   Blisters   Codeine Nausea And Vomiting   Garlic Diarrhea   Hydrocodone  Nausea And Vomiting   Iodine Other (See Comments)   Blisters   Macrobid [nitrofurantoin] Other (See Comments)   Instructed by dr not to take    Ofev  [nintedanib] Other (See Comments)   Abdominal pain   Onion Diarrhea   Shellfish Allergy Nausea And Vomiting   Statins Other (See Comments)   Leg pain, but Pravachol  is tolerated   Sulfa  Antibiotics Other (See Comments)   Headaches        Medication List     STOP taking these medications    ciclopirox 8 % solution Commonly known as: PENLAC   Delsym Cough/Cold Night Time 12.5-5-325 MG/10ML Liqd Generic drug: diphenhydrAMINE -PE-APAP   fluticasone  furoate-vilanterol 100-25 MCG/ACT Aepb Commonly known as: Breo Ellipta    ipratropium-albuterol  0.5-2.5 (3) MG/3ML Soln Commonly known as: DUONEB       TAKE these medications    albuterol  108 (90 Base) MCG/ACT inhaler Commonly known as: ProAir  HFA Inhale 2 puffs into the lungs  every 4 (four) hours as needed for wheezing. Or coughing spells.  You may use 2 Puffs 5-10 minutes before exercise. What changed:  reasons to take this additional instructions   albuterol  (2.5 MG/3ML) 0.083% nebulizer solution Commonly known as: PROVENTIL  Take 3 mLs (2.5 mg total) by nebulization every 6 (six) hours as needed for wheezing or shortness of breath. What changed: You were already taking a medication with the same name, and this prescription was added. Make sure you understand how and when to take each.   ALPRAZolam  0.25 MG tablet Commonly known as: XANAX  Take 0.25 mg by mouth at bedtime as needed for anxiety or sleep.   AMBULATORY NON FORMULARY MEDICATION See admin instructions. Allergy injections on Mondays and hold when sick   apixaban  5 MG Tabs tablet Commonly known as: ELIQUIS  Take 1 tablet (5 mg total) by mouth 2 (two) times daily.   arformoterol  15 MCG/2ML Nebu Commonly known as: BROVANA  Take 2 mLs (15 mcg total) by nebulization 2 (two) times daily.   ascorbic acid 500 MG tablet Commonly known as: VITAMIN C Take 500 mg by mouth  daily.   ASHWAGANDHA PO Take 1 capsule by mouth every other day.   azelastine 0.1 % nasal spray Commonly known as: ASTELIN Place 1 spray into both nostrils in the morning. Use in each nostril as directed   BEANO PO Take 1 tablet by mouth as needed (as directed, if eating onion or garlic).   budesonide  0.5 MG/2ML nebulizer solution Commonly known as: Pulmicort  Take 2 mLs (0.5 mg total) by nebulization in the morning and at bedtime. What changed:  when to take this reasons to take this   CALCIUM  500 + D PO Take 1 tablet by mouth daily.   chlorpheniramine 4 MG tablet Commonly known as: CHLOR-TRIMETON Take 2 mg by mouth 2 (two) times daily as needed for allergies or rhinitis.   cholecalciferol 25 MCG (1000 UNIT) tablet Commonly known as: VITAMIN D3 Take 1,000 Units by mouth every other day.   Dexcom G7 Sensor Misc Check  blood sugar fasting and before meals and at bedtime.   dicyclomine  10 MG capsule Commonly known as: BENTYL  Take 10 mg by mouth 2 (two) times daily as needed for spasms.   diltiazem  120 MG 24 hr capsule Commonly known as: CARDIZEM  CD Take 1 capsule (120 mg total) by mouth daily.   EPINEPHrine  0.3 mg/0.3 mL Soaj injection Commonly known as: EPI-PEN Inject 0.3 mg into the muscle as needed for anaphylaxis.   ezetimibe  10 MG tablet Commonly known as: ZETIA  Take 1 tablet (10 mg total) by mouth daily. What changed: when to take this   fluticasone  50 MCG/ACT nasal spray Commonly known as: FLONASE  Spray 2 sprays into each nostril every day. What changed: See the new instructions.   HumaLOG  KwikPen 100 UNIT/ML KwikPen Generic drug: insulin  lispro Inject 8-10 Units into the skin See admin instructions. Inject 10 units into the skin before breakfast & lunch and 8 before supper   hydrocortisone  25 MG suppository Commonly known as: ANUSOL -HC Place 1 suppository (25 mg total) rectally at bedtime for 12 days.   levocetirizine 5 MG tablet Commonly known as: XYZAL  Take 5 mg by mouth at bedtime.   metFORMIN  500 MG 24 hr tablet Commonly known as: GLUCOPHAGE -XR Take 500 mg by mouth daily with breakfast.   metoprolol  succinate 25 MG 24 hr tablet Commonly known as: TOPROL -XL Take 1 tablet (25 mg total) by mouth daily.   montelukast  10 MG tablet Commonly known as: SINGULAIR  TAKE 1 TABLET BY MOUTH EVERYDAY AT BEDTIME What changed: See the new instructions.   multivitamin tablet Take 1 tablet by mouth daily with breakfast.   mycophenolate  500 MG tablet Commonly known as: CellCept  Take 2 tablets (1,000 mg total) by mouth 2 (two) times daily. What changed:  how much to take how to take this when to take this additional instructions   NATURES TEARS OP Place 1 drop into both eyes in the morning and at bedtime.   nitroGLYCERIN  0.4 MG SL tablet Commonly known as: NITROSTAT  Place 1  tablet (0.4 mg total) under the tongue every 5 (five) minutes as needed for chest pain. What changed: Another medication with the same name was added. Make sure you understand how and when to take each.   nitroGLYCERIN  2 % ointment Commonly known as: NITROGLYN Apply 0.25 inches topically 2 (two) times daily. Apply To anus and surrounding tissue. Use pea size amount What changed: You were already taking a medication with the same name, and this prescription was added. Make sure you understand how and when to take each.  nystatin  cream Commonly known as: MYCOSTATIN  Apply 1 Application topically 2 (two) times daily as needed (for irritation- affected sites).   Omnipod 5 G7 Pods (Gen 5) Misc Inject 1 Device into the skin every 3 (three) days.   OXYGEN  Inhale 4-5 L/min into the lungs See admin instructions. Inhale 4-5 L/min at bedtime and as needed for shortness of breath during the day   pravastatin  40 MG tablet Commonly known as: PRAVACHOL  Take 1 tablet (40 mg total) by mouth every evening. What changed: when to take this   predniSONE  20 MG tablet Commonly known as: DELTASONE  Take 3 tablets (60 mg total) by mouth daily with breakfast. What changed:  medication strength See the new instructions.   Preparation H 0.25-50 % Gel Generic drug: Phenylephrine -[place] Place 1 application  rectally 2 (two) times daily as needed (for hemorrhoids).   PriLOSEC  OTC 20 MG tablet Generic drug: omeprazole  Take 10 mg by mouth See admin instructions. Take 10 mg by mouth at 4:30 AM   revefenacin  175 MCG/3ML nebulizer solution Commonly known as: YUPELRI  Take 3 mLs (175 mcg total) by nebulization daily.   Sodium Chloride  (Inhalant) 7 % Nebu Use per nebulizer once a day to help clear sputum   sodium chloride  0.65 % Soln nasal spray Commonly known as: OCEAN Place 1 spray into both nostrils as needed for congestion.   sulfamethoxazole -trimethoprim  800-160 MG tablet Commonly known as:  BACTRIM  DS Take 1 tablet by mouth every Monday, Wednesday, and Friday.        Follow-up Information     [facility] Follow up.   Specialty: Home Health Services Contact information: [address] [place] [address] [phone]                Allergies  Allergen Reactions   Ozempic (0.25 Or 0.5 Mg-Dose) [Semaglutide(0.25 Or 0.5mg -Dos)] Other (See Comments)    Paralyzed the stomach   Tape Other (See Comments)    SKIN IS VERY FRAGILE!! Burns and pulls up the skin!! SKIN BRUISES and TEARS EASILY.SABRA   Betadine [Povidone Iodine] Other (See Comments)    Blisters    Codeine Nausea And Vomiting   Garlic Diarrhea   Hydrocodone  Nausea And Vomiting   Iodine Other (See Comments)    Blisters    Macrobid [Nitrofurantoin] Other (See Comments)    Instructed by dr not to take    Ofev  [Nintedanib] Other (See Comments)    Abdominal pain   Onion Diarrhea   Shellfish Allergy Nausea And Vomiting   Statins Other (See Comments)    Leg pain, but Pravachol  is tolerated   Sulfa  Antibiotics Other (See Comments)    Headaches     Discharge Exam: Vitals:   05/30/24 0504 05/30/24 0800  BP: 125/79   Pulse: 68   Resp: 15   Temp: 98.8 F (37.1 C)   SpO2: 100% 99%    Physical Exam Vitals and nursing note reviewed.  Constitutional:      General: She is not in acute distress.    Appearance: She is obese. She is not toxic-appearing.  HENT:     Head: Normocephalic and atraumatic.     Nose: Nose normal.  Eyes:     General: No scleral icterus. Cardiovascular:     Rate and Rhythm: Normal rate and regular rhythm.  Pulmonary:     Effort: Pulmonary effort is normal.     Comments: Coarse BS. No wheezing Abdominal:     General: Bowel sounds are normal.  There is no distension.     Palpations: Abdomen is soft.  Musculoskeletal:     Right lower leg: No edema.     Left lower leg: No edema.     Comments: Trace edema bilateral ankles No edema of her feet   Skin:    General: Skin is warm and dry.     Capillary Refill: Capillary refill takes less than 2 seconds.  Neurological:     General: No focal deficit present.     Mental Status: She is alert and oriented to person, place, and time.     The results of significant diagnostics from this hospitalization (including imaging, microbiology, ancillary and laboratory) are listed below for reference.    Microbiology: Recent Results (from the past 240 hours)  Culture, blood (routine x 2)     Status: None   Collection Time: 05/22/24 12:50 PM   Specimen: BLOOD  Result Value Ref Range Status   Specimen Description   Final    BLOOD LEFT ANTECUBITAL Performed at [facility], [address] [place], [place] [address]    Special Requests   Final    BOTTLES DRAWN AEROBIC AND ANAEROBIC Blood Culture results may not be optimal due to an inadequate volume of blood received in culture bottles Performed at [name], [address], [place], [place] [address]    Culture   Final    NO GROWTH 5 DAYS Performed at [facility], [address], [place], [place] [address]    Report Status 05/27/2024 FINAL  Final  Culture, blood (routine x 2)     Status: None   Collection Time: 05/22/24  1:27 PM   Specimen: BLOOD  Result Value Ref Range Status   Specimen Description   Final    BLOOD BLOOD LEFT FOREARM Performed at [facility], [address], [place], [place] [address]    Special Requests   Final    BOTTLES DRAWN AEROBIC AND ANAEROBIC Blood Culture results may not be optimal due to an inadequate volume of blood received in culture bottles Performed at [facility], [address], [place], [place] [address]    Culture   Final    NO GROWTH 5 DAYS Performed at [facility], [address] [place], [place] [address]    Report Status 05/27/2024 FINAL  Final  Respiratory (~20 pathogens) panel by PCR      Status: None   Collection Time: 05/22/24  4:12 PM   Specimen: Nasopharyngeal Swab; Respiratory  Result Value Ref Range Status   Adenovirus NOT DETECTED NOT DETECTED Final   Coronavirus 229E NOT DETECTED NOT DETECTED Final    Comment: (NOTE) The Coronavirus on the Respiratory Panel, DOES NOT test for the novel  Coronavirus (2019 nCoV)    Coronavirus HKU1 NOT DETECTED NOT DETECTED Final   Coronavirus NL63 NOT DETECTED NOT DETECTED Final   Coronavirus OC43 NOT DETECTED NOT DETECTED Final   Metapneumovirus NOT DETECTED NOT DETECTED Final   Rhinovirus / Enterovirus NOT DETECTED NOT DETECTED Final   Influenza A NOT DETECTED NOT DETECTED Final   Influenza B NOT DETECTED NOT DETECTED Final   Parainfluenza Virus 1 NOT DETECTED NOT DETECTED Final   Parainfluenza Virus 2 NOT DETECTED NOT DETECTED Final   Parainfluenza Virus 3 NOT DETECTED NOT DETECTED Final   Parainfluenza Virus 4 NOT DETECTED NOT DETECTED Final   Respiratory Syncytial Virus NOT DETECTED NOT DETECTED Final   Bordetella pertussis  NOT DETECTED NOT DETECTED Final   Bordetella Parapertussis NOT DETECTED NOT DETECTED Final   Chlamydophila pneumoniae NOT DETECTED NOT DETECTED Final   Mycoplasma pneumoniae NOT DETECTED NOT DETECTED Final    Comment: Performed at [facility], [address], [place], [place] [address]  Pneumocystis smear by DFA     Status: None   Collection Time: 05/23/24  2:11 PM   Specimen: Bronchial Alveolar Lavage; Respiratory  Result Value Ref Range Status   Specimen Source-PJSRC BRONCHIAL ALVEOLAR LAVAGE  Final    Comment: RUL Performed at [facility], [address] [place], [place] [address]    Pneumocystis jiroveci Ag NEGATIVE  Final    Comment: Performed at [facility], [address] [place], [place] [address]  Fungus Culture With Stain     Status: None (Preliminary result)   Collection Time: 05/23/24  2:11 PM   Specimen: Bronchial Alveolar Lavage; Respiratory   Result Value Ref Range Status   Fungus Stain Final report  Final    Comment: (NOTE) Performed At: [facility] [address] [place], [place] [id number] Jennette Shorter MD Ey:[id number]    Fungus (Mycology) Culture PENDING  Incomplete   Fungal Source BRONCHIAL ALVEOLAR LAVAGE  Final    Comment: RUL Performed at [facility], [address], [place], [place] [address]   Culture, BAL-quantitative w Gram Stain     Status: None   Collection Time: 05/23/24  2:11 PM   Specimen: Bronchial Alveolar Lavage; Respiratory  Result Value Ref Range Status   Specimen Description BRONCHIAL ALVEOLAR LAVAGE  Final   Special Requests RUL  Final   Gram Stain   Final    RARE WBC PRESENT, PREDOMINANTLY PMN NO ORGANISMS SEEN    Culture   Final    NO GROWTH 2 DAYS Performed at [facility], [address] [place], [place] [address]    Report Status 05/26/2024 FINAL  Final  Acid Fast Smear (AFB)     Status: None   Collection Time: 05/23/24  2:11 PM   Specimen: Bronchial Alveolar Lavage; Respiratory  Result Value Ref Range Status   AFB Specimen Processing Concentration  Final   Acid Fast Smear Negative  Final    Comment: (NOTE) Performed At: [facility] [address] [place], [place] [id number] Jennette Shorter MD Ey:[id number]    Source (AFB) BRONCHIAL ALVEOLAR LAVAGE  Final    Comment: RUL Performed at [facility], [address] [place], [place] [address]   Anaerobic culture w Gram Stain     Status: None   Collection Time: 05/23/24  2:11 PM   Specimen: Bronchoalveolar Lavage  Result Value Ref Range Status   Specimen Description BRONCHIAL ALVEOLAR LAVAGE  Final   Special Requests RUL  Final   Gram Stain   Final    RARE WBC PRESENT, PREDOMINANTLY PMN NO ORGANISMS SEEN    Culture   Final    NO ANAEROBES ISOLATED Performed at [facility], [address], [place], [place] [address]    Report Status 05/28/2024 FINAL  Final  Fungus Culture  Result     Status: None   Collection Time: 05/23/24  2:11 PM  Result Value Ref Range Status   Result 1 Comment  Final    Comment: (NOTE) KOH/Calcofluor preparation:  no fungus observed. Performed At: [facility] [address] [place], [place] [id number] Jennette Shorter MD Ey:[id number]   Fungus Culture With Stain  Status: None (Preliminary result)   Collection Time: 05/23/24  2:17 PM   Specimen: Bronchial Alveolar Lavage; Respiratory  Result Value Ref Range Status   Fungus Stain Final report  Final    Comment: (NOTE) Performed At: [facility] [address] [place], [place] [id number] Jennette Shorter MD Ey:[id number]    Fungus (Mycology) Culture PENDING  Incomplete   Fungal Source BRONCHIAL ALVEOLAR LAVAGE  Final    Comment: LUL Performed at [facility], [address], [place], [place] [address]   Culture, BAL-quantitative w Gram Stain     Status: None   Collection Time: 05/23/24  2:17 PM   Specimen: Bronchial Alveolar Lavage; Respiratory  Result Value Ref Range Status   Specimen Description   Final    BRONCHIAL ALVEOLAR LAVAGE Performed at [facility], [address], [place], [place] [address]    Special Requests LUL  Final   Gram Stain   Final    RARE WBC PRESENT, PREDOMINANTLY PMN NO ORGANISMS SEEN    Culture   Final    NO GROWTH 2 DAYS Performed at [facility], [address], [place], [place] [address]    Report Status 05/26/2024 FINAL  Final  Acid Fast Smear (AFB)     Status: None   Collection Time: 05/23/24  2:17 PM   Specimen: Bronchial Alveolar Lavage; Respiratory  Result Value Ref Range Status   AFB Specimen Processing Concentration  Final   Acid Fast Smear Negative  Final    Comment: (NOTE) Performed At: [facility] [address] [place], [place] [id number] Jennette Shorter MD Ey:[id number]    Source (AFB) BRONCHIAL ALVEOLAR LAVAGE  Final    Comment: LUL Performed at [facility], [address], [place], [place] [address]   Anaerobic culture w Gram Stain     Status: None   Collection Time: 05/23/24  2:17 PM   Specimen: Bronchoalveolar Lavage  Result Value Ref Range Status   Specimen Description BRONCHIAL ALVEOLAR LAVAGE  Final   Special Requests LUL  Final   Gram Stain   Final    RARE WBC PRESENT, PREDOMINANTLY PMN NO ORGANISMS SEEN    Culture   Final    NO ANAEROBES ISOLATED Performed at [facility], [address] [place], [place] [address]    Report Status 05/28/2024 FINAL  Final  Fungus Culture Result     Status: None   Collection Time: 05/23/24  2:17 PM  Result Value Ref Range Status   Result 1 Comment  Final    Comment: (NOTE) KOH/Calcofluor preparation:  no fungus observed. Performed At: [facility] [address] [place], [place] [id number] Jennette Shorter MD Ey:[id number]      Labs: BNP (last 3 results) Recent Labs    03/14/24 1248 03/16/24 0550 05/22/24 1302  BNP 60.8 77.5 89.7   Basic Metabolic Panel: Recent Labs  Lab 05/25/24 0527 05/26/24 0337 05/27/24 0632 05/28/24 0425 05/29/24 0908  NA 137 137 139 139 136  K 4.3 4.3 3.9 3.2* 3.3*  CL 103 101 99 99 99  CO2 27 25 28 29 24   GLUCOSE 228* 217* 177* 148* 301*  BUN 27* 30* 25* 21 31*  CREATININE 0.91 0.67 0.93 0.61 0.83  CALCIUM  8.8* 8.5* 8.8* 8.2* 8.6*  MG  --   --   --  2.6*  --   PHOS 3.9 3.3 4.3 3.8  --    Liver Function Tests: Recent Labs  Lab  05/25/24 0527 05/26/24 0337 05/27/24 0632 05/28/24 0425 05/29/24 0908  AST  --   --   --   --  25  ALT  --   --   --   --  45*  ALKPHOS  --   --   --   --  35*  BILITOT  --   --   --   --  1.0  PROT  --   --   --   --  5.6*  ALBUMIN 3.1* 2.8* 3.1* 2.8* 3.1*   CBC: Recent Labs  Lab 05/26/24 1045 05/27/24 0632 05/28/24 0425 05/29/24 0908 05/30/24 0823  WBC 16.2* 17.7* 12.4* 13.6* 18.3*  NEUTROABS  --   --   --   --  15.9*  HGB 12.5 13.8 12.5 13.2 12.6  HCT 40.8 45.0 39.9 41.5 39.9  MCV 90.9  90.7 88.3 88.7 89.9  PLT 182 197 144* 169 168   CBG: Recent Labs  Lab 05/29/24 0722 05/29/24 1125 05/29/24 1637 05/29/24 2049 05/30/24 0723  GLUCAP 242* 275* 281* 322* 216*   Sepsis Labs Recent Labs  Lab 05/27/24 0632 05/28/24 0425 05/29/24 0908 05/30/24 0823  WBC 17.7* 12.4* 13.6* 18.3*    Procedures/Studies: VAS US  LOWER EXTREMITY VENOUS (DVT) Result Date: 05/24/2024  Lower Venous DVT Study Patient Name:  CAELEIGH PROHASKA Romey  Date of Exam:   05/24/2024 Medical Rec #: [id number]          Accession #:    [id number] Date of Birth: June 05, 1951          Patient Gender: F Patient Age:   13 years Exam Location:  [facility] Procedure:      VAS US  LOWER EXTREMITY VENOUS (DVT) Referring Phys: LONNA CODER --------------------------------------------------------------------------------  Indications: Pulmonary embolism.  Comparison Study: No previous exams Performing Technologist: Jody Hill RVT, RDMS  Examination Guidelines: A complete evaluation includes B-mode imaging, spectral Doppler, color Doppler, and power Doppler as needed of all accessible portions of each vessel. Bilateral testing is considered an integral part of a complete examination. Limited examinations for reoccurring indications may be performed as noted. The reflux portion of the exam is performed with the patient in reverse Trendelenburg.  +---------+---------------+---------+-----------+----------+--------------+ RIGHT    CompressibilityPhasicitySpontaneityPropertiesThrombus Aging +---------+---------------+---------+-----------+----------+--------------+ CFV      Full           Yes      Yes                                 +---------+---------------+---------+-----------+----------+--------------+ SFJ      Full                                                        +---------+---------------+---------+-----------+----------+--------------+ FV Prox  Full           Yes      Yes                                  +---------+---------------+---------+-----------+----------+--------------+ FV Mid   Full           Yes      Yes                                 +---------+---------------+---------+-----------+----------+--------------+  FV DistalFull           Yes      Yes                                 +---------+---------------+---------+-----------+----------+--------------+ PFV      Full                                                        +---------+---------------+---------+-----------+----------+--------------+ POP      Full           Yes      Yes                                 +---------+---------------+---------+-----------+----------+--------------+ PTV      Full                                                        +---------+---------------+---------+-----------+----------+--------------+ PERO     Full                                                        +---------+---------------+---------+-----------+----------+--------------+   +---------+---------------+---------+-----------+----------+--------------+ LEFT     CompressibilityPhasicitySpontaneityPropertiesThrombus Aging +---------+---------------+---------+-----------+----------+--------------+ CFV      Full           Yes      Yes                                 +---------+---------------+---------+-----------+----------+--------------+ SFJ      Full                                                        +---------+---------------+---------+-----------+----------+--------------+ FV Prox  Full           Yes      Yes                                 +---------+---------------+---------+-----------+----------+--------------+ FV Mid   Full           Yes      Yes                                 +---------+---------------+---------+-----------+----------+--------------+ FV DistalFull           Yes      Yes                                  +---------+---------------+---------+-----------+----------+--------------+ PFV      Full                                                        +---------+---------------+---------+-----------+----------+--------------+  POP      Full           Yes      Yes                                 +---------+---------------+---------+-----------+----------+--------------+ PTV      Full                                                        +---------+---------------+---------+-----------+----------+--------------+ PERO     Full                                                        +---------+---------------+---------+-----------+----------+--------------+     Summary: BILATERAL: - No evidence of deep vein thrombosis seen in the lower extremities, bilaterally. - RIGHT: - A cystic structure is found in the popliteal fossa (1.76 x 0.752 x 1.61 cm).  LEFT: - No cystic structure found in the popliteal fossa.  *See table(s) above for measurements and observations. Electronically signed by Gaile New MD on 05/24/2024 at 9:56:33 PM.    Final    CT CHEST ABDOMEN PELVIS W CONTRAST Result Date: 05/24/2024 CLINICAL DATA:  Lymphadenopathy, unprovoked pulmonary embolism, concern for occult malignancy * Tracking Code: BO * EXAM: CT CHEST, ABDOMEN, AND PELVIS WITH CONTRAST TECHNIQUE: Multidetector CT imaging of the chest, abdomen and pelvis was performed following the standard protocol during bolus administration of intravenous contrast. RADIATION DOSE REDUCTION: This exam was performed according to the departmental dose-optimization program which includes automated exposure control, adjustment of the mA and/or kV according to patient size and/or use of iterative reconstruction technique. CONTRAST:  100mL OMNIPAQUE  IOHEXOL  300 MG/ML SOLN additional oral enteric contrast COMPARISON:  CT chest angiogram, 05/22/2024 FINDINGS: CT CHEST FINDINGS Cardiovascular: Aortic atherosclerosis. Mild cardiomegaly.  Three-vessel coronary artery calcifications no pericardial effusion. Mediastinum/Nodes: No enlarged mediastinal, hilar, or axillary lymph nodes. Small hiatal hernia. Thyroid  gland, trachea, and esophagus demonstrate no significant findings. Lungs/Pleura: Moderate pulmonary fibrosis in a heterogeneously distributed pattern, prominent areas of bronchiectasis peribronchovascular interstitial opacity, bronchiolectasis, and architectural distortion throughout the lungs, particularly in the right upper lobe and left lung base. Trace pleural effusions. Musculoskeletal: No chest wall abnormality. No acute osseous findings. CT ABDOMEN PELVIS FINDINGS Hepatobiliary: No solid liver abnormality is seen. No gallstones, gallbladder wall thickening, or biliary dilatation. Pancreas: Unremarkable. No pancreatic ductal dilatation or surrounding inflammatory changes. Spleen: Normal in size without significant abnormality. Adrenals/Urinary Tract: Adrenal glands are unremarkable. Kidneys are normal, without renal calculi, solid lesion, or hydronephrosis. Bladder is unremarkable. Stomach/Bowel: Stomach is within normal limits. Appendix appears normal. No evidence of bowel wall thickening, distention, or inflammatory changes. Vascular/Lymphatic: Aortic atherosclerosis. No enlarged abdominal or pelvic lymph nodes. Reproductive: No mass or other abnormality. Other: No abdominal wall hernia or abnormality. No ascites. Musculoskeletal: No acute osseous findings. Status post bilateral hip total arthroplasty. IMPRESSION: 1. No evidence of lymphadenopathy or metastatic disease in the chest, abdomen, or pelvis. 2. Moderate pulmonary fibrosis, unchanged, in an alternative diagnosis pattern by ATS pulmonary fibrosis criteria, imaging appearance again favoring chronic hypersensitivity pneumonitis. 3. Trace pleural effusions. 4. Mild cardiomegaly  and coronary artery disease. Aortic Atherosclerosis (ICD10-I70.0). Electronically Signed   By: Marolyn JONETTA Jaksch M.D.   On: 05/24/2024 17:05   CT Angio Chest PE W/Cm &/Or Wo Cm Result Date: 05/22/2024 CLINICAL DATA:  Positive D-dimer, hypoxia, labored breathing, history of interstitial lung disease EXAM: CT ANGIOGRAPHY CHEST WITH CONTRAST TECHNIQUE: Multidetector CT imaging of the chest was performed using the standard protocol during bolus administration of intravenous contrast. Multiplanar CT image reconstructions and MIPs were obtained to evaluate the vascular anatomy. RADIATION DOSE REDUCTION: This exam was performed according to the departmental dose-optimization program which includes automated exposure control, adjustment of the mA and/or kV according to patient size and/or use of iterative reconstruction technique. CONTRAST:  75mL OMNIPAQUE  IOHEXOL  350 MG/ML SOLN COMPARISON:  03/14/2024 FINDINGS: Cardiovascular: This is a technically adequate evaluation of the pulmonary vasculature. There is a subsegmental right lower lobe pulmonary embolus, reference image 78/4. Nonocclusive filling defect within the left upper lobe pulmonary artery reference image 54/4 consistent with age indeterminate pulmonary embolus. Clot burden is minimal. No right heart strain. The heart is unremarkable without pericardial effusion. Normal caliber of the thoracic aorta. Atherosclerosis of the aorta and coronary vasculature. Mediastinum/Nodes: Stable borderline enlarged mediastinal lymph nodes, likely reactive. No pathologic adenopathy. Thyroid , trachea, and esophagus are stable. Lungs/Pleura: Stable areas of bilobed bilateral pulmonary fibrosis and scarring, with minimal traction bronchiectasis unchanged. Stable areas of areas of ground-glass opacity most pronounced in the upper lobes consistent with pneumonitis. No dense consolidation, effusion, or pneumothorax. Central airways are patent. Upper Abdomen: No acute abnormality. Musculoskeletal: No acute or destructive bony abnormalities. Reconstructed images demonstrate no additional  findings. Review of the MIP images confirms the above findings. IMPRESSION: 1. Small bilateral pulmonary emboli within the right lower and left upper lobes. Minimal clot burden, with no evidence of right heart strain. 2. Stable findings of chronic pulmonary fibrosis, without appreciable change in the scattered ground-glass opacity seen previously consistent with pneumonitis. 3. Aortic Atherosclerosis (ICD10-I70.0). Coronary artery atherosclerosis. Critical Value/emergent results were called by telephone at the time of interpretation on 05/22/2024 at 4:47 pm to provider ANDREW TEE, who verbally acknowledged these results. Electronically Signed   By: Ozell Daring M.D.   On: 05/22/2024 16:51   DG Chest 2 View Result Date: 05/22/2024 CLINICAL DATA:  Shortness of breath.  Hypoxia. EXAM: CHEST - 2 VIEW COMPARISON:  03/14/2024 FINDINGS: Normal-sized heart. Stable extensive chronic interstitial prominence. Stable right middle lobe linear scarring. Unremarkable bones. IMPRESSION: 1. No acute abnormality. 2. Stable extensive chronic interstitial lung disease. Electronically Signed   By: Elspeth Bathe M.D.   On: 05/22/2024 13:21   ECHOCARDIOGRAM COMPLETE BUBBLE STUDY Result Date: 05/12/2024    ECHOCARDIOGRAM REPORT   Patient Name:   ARDENA GANGL Vandyne Date of Exam: 05/12/2024 Medical Rec #:  [id number]         Height:       59.0 in Accession #:    [id number]        Weight:       145.5 lb Date of Birth:  07/10/1951         BSA:          1.611 m Patient Age:    73 years          BP:           142/68 mmHg Patient Gender: F                 HR:  72 bpm. Exam Location:  Outpatient Procedure: 2D Echo, Color Doppler and Cardiac Doppler (Both Spectral and Color            Flow Doppler were utilized during procedure). Indications:    Dyspnea  History:        Patient has prior history of Echocardiogram examinations, most                 recent 03/12/2023.  Sonographer:    Benard Stallion Referring Phys: 16 MURALI RAMASWAMY  IMPRESSIONS  1. Left ventricular ejection fraction, by estimation, is 65 to 70%. The left ventricle has normal function. The left ventricle has no regional wall motion abnormalities. There is mild concentric left ventricular hypertrophy. Left ventricular diastolic parameters are consistent with Grade I diastolic dysfunction (impaired relaxation).  2. Right ventricular systolic function is normal. The right ventricular size is normal.  3. Left atrial size was mildly dilated.  4. The mitral valve is abnormal. Mild mitral valve regurgitation. No evidence of mitral stenosis. There is mild late systolic prolapse of the medial scallop of the posterior leaflet of the mitral valve.  5. The aortic valve is tricuspid. There is mild calcification of the aortic valve. Aortic valve regurgitation is trivial. Aortic valve sclerosis/calcification is present, without any evidence of aortic stenosis. Aortic valve area, by VTI measures 1.87 cm. Aortic valve mean gradient measures 4.0 mmHg. Aortic valve Vmax measures 1.37 m/s.  6. The inferior vena cava is normal in size with greater than 50% respiratory variability, suggesting right atrial pressure of 3 mmHg. FINDINGS  Left Ventricle: Left ventricular ejection fraction, by estimation, is 65 to 70%. The left ventricle has normal function. The left ventricle has no regional wall motion abnormalities. The left ventricular internal cavity size was normal in size. There is  mild concentric left ventricular hypertrophy. Left ventricular diastolic parameters are consistent with Grade I diastolic dysfunction (impaired relaxation). Right Ventricle: The right ventricular size is normal. No increase in right ventricular wall thickness. Right ventricular systolic function is normal. Left Atrium: Left atrial size was mildly dilated. Right Atrium: Right atrial size was normal in size. Pericardium: There is no evidence of pericardial effusion. Mitral Valve: The mitral valve is abnormal. There is  mild late systolic prolapse of the medial scallop of the posterior leaflet of the mitral valve. Mild mitral valve regurgitation. No evidence of mitral valve stenosis. Tricuspid Valve: The tricuspid valve is normal in structure. Tricuspid valve regurgitation is mild . No evidence of tricuspid stenosis. Aortic Valve: The aortic valve is tricuspid. There is mild calcification of the aortic valve. Aortic valve regurgitation is trivial. Aortic regurgitation PHT measures 580 msec. Aortic valve sclerosis/calcification is present, without any evidence of aortic stenosis. Aortic valve mean gradient measures 4.0 mmHg. Aortic valve peak gradient measures 7.5 mmHg. Aortic valve area, by VTI measures 1.87 cm. Pulmonic Valve: The pulmonic valve was normal in structure. Pulmonic valve regurgitation is trivial. No evidence of pulmonic stenosis. Aorta: The aortic root is normal in size and structure. Venous: The inferior vena cava is normal in size with greater than 50% respiratory variability, suggesting right atrial pressure of 3 mmHg. IAS/Shunts: No atrial level shunt detected by color flow Doppler.  LEFT VENTRICLE PLAX 2D LVIDd:         4.20 cm   Diastology LVIDs:         2.80 cm   LV e' medial:    5.00 cm/s LV PW:         0.80  cm   LV E/e' medial:  12.4 LV IVS:        0.80 cm   LV e' lateral:   5.00 cm/s LVOT diam:     1.80 cm   LV E/e' lateral: 12.4 LV SV:         52 LV SV Index:   32 LVOT Area:     2.54 cm  RIGHT VENTRICLE RV Basal diam:  3.05 cm RV Mid diam:    2.70 cm RV S prime:     16.40 cm/s TAPSE (M-mode): 1.9 cm LEFT ATRIUM             Index        RIGHT ATRIUM           Index LA diam:        3.50 cm 2.17 cm/m   RA Area:     10.40 cm LA Vol (A2C):   41.8 ml 25.94 ml/m  RA Volume:   18.20 ml  11.30 ml/m LA Vol (A4C):   62.9 ml 39.04 ml/m LA Biplane Vol: 52.4 ml 32.52 ml/m  AORTIC VALVE AV Area (Vmax):    1.88 cm AV Area (Vmean):   1.83 cm AV Area (VTI):     1.87 cm AV Vmax:           137.00 cm/s AV Vmean:           92.300 cm/s AV VTI:            0.278 m AV Peak Grad:      7.5 mmHg AV Mean Grad:      4.0 mmHg LVOT Vmax:         101.00 cm/s LVOT Vmean:        66.300 cm/s LVOT VTI:          0.204 m LVOT/AV VTI ratio: 0.73 AI PHT:            580 msec  AORTA Ao Root diam: 2.70 cm Ao Asc diam:  2.90 cm MITRAL VALVE                TRICUSPID VALVE MV Area (PHT): 3.77 cm     TR Peak grad:   31.8 mmHg MV Decel Time: 201 msec     TR Vmax:        282.00 cm/s MV E velocity: 62.20 cm/s MV A velocity: 106.00 cm/s  SHUNTS MV E/A ratio:  0.59         Systemic VTI:  0.20 m                             Systemic Diam: 1.80 cm Toribio Fuel MD Electronically signed by Toribio Fuel MD Signature Date/Time: 05/12/2024/6:10:50 PM    Final     Time coordinating discharge: 50 mins  SIGNED:  Camellia Door, DO [facility] 05/30/24, 9:19 AM

## 2024-05-30 NOTE — Telephone Encounter (Signed)
 Thanks for sending it. Adding [place] and infusion center team to help with the follow up Rituxan  infusion.

## 2024-05-30 NOTE — Plan of Care (Signed)

## 2024-05-30 NOTE — Progress Notes (Signed)
 Discharge Progress Report  Patient Details  Name: Rhonda [name] MRN: [id number] Date of Birth: 22-Mar-1951 Referring Provider:   [facility] Walk Test from 05/05/2024 in [facility]  Referring Provider Ramaswamy     Number of Visits: 1  Reason for Discharge:  Early Exit:  hospitalized  Smoking History:  Social History   Tobacco Use  Smoking Status Never  Smokeless Tobacco Never  Tobacco Comments   pt states she experimented in college    Diagnosis:  ILD (interstitial lung disease) (HCC)  ADL [facility]:  Pulmonary Assessment Scores     Row Name 05/05/24 1145         ADL [facility]   ADL Phase Entry     SOB Score total 55       CAT Score   CAT Score 21       mMRC Score   mMRC Score 3        Initial Exercise Prescription:  Initial Exercise Prescription - 05/05/24 1100       Date of Initial Exercise RX and Referring Provider   Date 05/05/24    Referring Provider Ramaswamy    Expected Discharge Date 08/01/24      Treadmill   MPH 1.5    Grade 0    Minutes 15    METs 2      NuStep   Level --    SPM --    Minutes --    METs --      Recumbant Elliptical   Level 1    RPM 60    Watts 80    Minutes 15    METs 2      Prescription Details   Frequency (times per week) 2    Duration Progress to 30 minutes of continuous aerobic without signs/symptoms of physical distress      Intensity   THRR 40-80% of Max Heartrate 58-116    Ratings of Perceived Exertion 11-13    Perceived Dyspnea 0-4      Progression   Progression Continue to progress workloads to maintain intensity without signs/symptoms of physical distress.      Resistance Training   Training Prescription Yes    Weight red bands    Reps 10-15          Discharge Exercise Prescription (Final Exercise Prescription Changes):  Exercise Prescription Changes - 05/11/24 1212       Response to Exercise   Blood Pressure  (Admit) 118/60    Blood Pressure (Exercise) 122/62    Blood Pressure (Exit) 134/68    Heart Rate (Admit) 78 bpm    Heart Rate (Exercise) 89 bpm    Heart Rate (Exit) 82 bpm    Oxygen  Saturation (Admit) 94 %    Oxygen  Saturation (Exercise) 96 %    Oxygen  Saturation (Exit) 98 %    Rating of Perceived Exertion (Exercise) 14    Perceived Dyspnea (Exercise) 3    Duration Progress to 30 minutes of  aerobic without signs/symptoms of physical distress    Intensity THRR unchanged      Progression   Progression Continue to progress workloads to maintain intensity without signs/symptoms of physical distress.      Resistance Training   Training Prescription Yes    Weight red bands    Reps 10-15    Time 10 Minutes      Oxygen    Oxygen  Continuous    Liters 4  Treadmill   MPH 1.2    Grade 0    Minutes 15    METs 1.8      Recumbant Elliptical   Level 1    Minutes 15          Functional Capacity:  6 Minute Walk     Row Name 05/05/24 1150         6 Minute Walk   Phase Initial     Distance 903 feet     Walk Time 6 minutes     # of Rest Breaks 2  2:25-2:44, 4:32-4:51     MPH 1.71     METS 1.74     RPE 12     Perceived Dyspnea  2.5     VO2 Peak 6.09     Symptoms No     Resting HR 75 bpm     Resting BP 126/58     Resting Oxygen  Saturation  92 %     Exercise Oxygen  Saturation  during 6 min walk 89 %     Max Ex. HR 93 bpm     Max Ex. BP 150/64     2 Minute Post BP 134/62       Interval HR   1 Minute HR 89     2 Minute HR 91     3 Minute HR 89     4 Minute HR 92     5 Minute HR 90     6 Minute HR 93     2 Minute Post HR 72     Interval Heart Rate? Yes       Interval Oxygen    Interval Oxygen ? Yes     Baseline Oxygen  Saturation % 92 %     1 Minute Oxygen  Saturation % 91 %     1 Minute Liters of Oxygen  0 L     2 Minute Oxygen  Saturation % 89 %     2 Minute Liters of Oxygen  0 L     3 Minute Oxygen  Saturation % 91 %     3 Minute Liters of Oxygen  0 L     4  Minute Oxygen  Saturation % 89 %     4 Minute Liters of Oxygen  0 L     5 Minute Oxygen  Saturation % 92 %     5 Minute Liters of Oxygen  0 L     6 Minute Oxygen  Saturation % 90 %     6 Minute Liters of Oxygen  0 L     2 Minute Post Oxygen  Saturation % 99 %     2 Minute Post Liters of Oxygen  0 L        Psychological, QOL, Others - Outcomes: PHQ 2/9:    05/05/2024   11:45 AM 02/20/2020    2:42 PM 09/03/2017   11:58 AM 08/21/2015    3:23 PM 03/04/2015    3:10 PM  Depression screen PHQ 2/9  Decreased Interest 0 0 0 0 0  Down, Depressed, Hopeless 1 0 0 0 0  PHQ - 2 Score 1 0 0 0 0  Altered sleeping 0 0     Tired, decreased energy 1 0     Change in appetite 0 0     Feeling bad or failure about yourself  0 0     Trouble concentrating 1 0     Moving slowly or fidgety/restless 0 0     Suicidal thoughts 0 0     PHQ-9  Score 3 0     Difficult doing work/chores Not difficult at all Not difficult at all       Quality of Life:   Personal Goals: Goals established at orientation with interventions provided to work toward goal.  Personal Goals and Risk Factors at Admission - 05/05/24 1055       Core Components/Risk Factors/Patient Goals on Admission    Weight Management Weight Loss;Yes    Intervention Weight Management: Develop a combined nutrition and exercise program designed to reach desired caloric intake, while maintaining appropriate intake of nutrient and fiber, sodium and fats, and appropriate energy expenditure required for the weight goal.;Weight Management: Provide education and appropriate resources to help participant work on and attain dietary goals.;Weight Management/Obesity: Establish reasonable short term and long term weight goals.;Obesity: Provide education and appropriate resources to help participant work on and attain dietary goals.    Expected Outcomes Short Term: Continue to assess and modify interventions until short term weight is achieved;Long Term: Adherence to  nutrition and physical activity/exercise program aimed toward attainment of established weight goal;Weight Loss: Understanding of general recommendations for a balanced deficit meal plan, which promotes 1-2 lb weight loss per week and includes a negative energy balance of [phone] kcal/d;Understanding recommendations for meals to include 15-35% energy as protein, 25-35% energy from fat, 35-60% energy from carbohydrates, less than 200mg  of dietary cholesterol, 20-35 gm of total fiber daily;Understanding of distribution of calorie intake throughout the day with the consumption of 4-5 meals/snacks    Improve shortness of breath with ADL's Yes    Intervention Provide education, individualized exercise plan and daily activity instruction to help decrease symptoms of SOB with activities of daily living.    Expected Outcomes Short Term: Improve cardiorespiratory fitness to achieve a reduction of symptoms when performing ADLs;Long Term: Be able to perform more ADLs without symptoms or delay the onset of symptoms    Increase knowledge of respiratory medications and ability to use respiratory devices properly  Yes    Intervention Provide education and demonstration as needed of appropriate use of medications, inhalers, and oxygen  therapy.    Expected Outcomes Short Term: Achieves understanding of medications use. Understands that oxygen  is a medication prescribed by physician. Demonstrates appropriate use of inhaler and oxygen  therapy.;Long Term: Maintain appropriate use of medications, inhalers, and oxygen  therapy.           Personal Goals Discharge:  Goals and Risk Factor Review     Row Name 05/22/24 9157 05/30/24 1547           Core Components/Risk Factors/Patient Goals Review   Personal Goals Review Weight Management/Obesity;Improve shortness of breath with ADL's;Develop more efficient breathing techniques such as purse lipped breathing and diaphragmatic breathing and practicing self-pacing with  activity.;Increase knowledge of respiratory medications and ability to use respiratory devices properly. Weight Management/Obesity;Improve shortness of breath with ADL's;Develop more efficient breathing techniques such as purse lipped breathing and diaphragmatic breathing and practicing self-pacing with activity.;Increase knowledge of respiratory medications and ability to use respiratory devices properly.      Review Monthly review of patient's Core Components/Risk Factors/Patient Goals are as follows: Goal progressing for weight loss. Rhonda [name] is working with the staff dietitician to meet her weight loss goals. Goal progressing for improving shortness of breath. Rhonda [name] is using 4L O2 while exercising to keep sats >88%. She is currently exercising on the treadmill and recumbant elliptical. Goal progressing for developing more efficient breathing techniques such as purse lipped breathing and diaphragmatic breathing; and practicing  self-pacing with activity. Goal progressing for increase knowledge of respiratory medications and ability to use respiratory devices properly.  We will continue to monitor her progress throughout the program. Unfortunately, Rhonda [name] was discharged from the program on 05/30/24 due to being hospitalized for an illness. She only attended one session. Her core components/goals were not met.      Expected Outcomes To improve shortness of breath with ADL's, develop more efficient breathing techniques such as purse lipped breathing and diaphragmatic breathing; and practicing self-pacing with activity, lose weight, and increase knowledge of respiratory medications and ability to use respiratory devices properly. --         Exercise Goals and Review:  Exercise Goals     Row Name 05/05/24 1029             Exercise Goals   Increase Physical Activity Yes       Intervention Provide advice, education, support and counseling about physical activity/exercise needs.;Develop an individualized exercise  prescription for aerobic and resistive training based on initial evaluation findings, risk stratification, comorbidities and participant's personal goals.       Expected Outcomes Short Term: Attend rehab on a regular basis to increase amount of physical activity.;Long Term: Add in home exercise to make exercise part of routine and to increase amount of physical activity.;Long Term: Exercising regularly at least 3-5 days a week.       Increase Strength and Stamina Yes       Intervention Provide advice, education, support and counseling about physical activity/exercise needs.;Develop an individualized exercise prescription for aerobic and resistive training based on initial evaluation findings, risk stratification, comorbidities and participant's personal goals.       Expected Outcomes Short Term: Increase workloads from initial exercise prescription for resistance, speed, and METs.;Short Term: Perform resistance training exercises routinely during rehab and add in resistance training at home;Long Term: Improve cardiorespiratory fitness, muscular endurance and strength as measured by increased METs and functional capacity ( )       Able to understand and use rate of perceived exertion (RPE) scale Yes       Intervention Provide education and explanation on how to use RPE scale       Expected Outcomes Short Term: Able to use RPE daily in rehab to express subjective intensity level;Long Term:  Able to use RPE to guide intensity level when exercising independently       Able to understand and use Dyspnea scale Yes       Intervention Provide education and explanation on how to use Dyspnea scale       Expected Outcomes Short Term: Able to use Dyspnea scale daily in rehab to express subjective sense of shortness of breath during exertion;Long Term: Able to use Dyspnea scale to guide intensity level when exercising independently       Knowledge and understanding of Target Heart Rate Range (THRR) Yes        Intervention Provide education and explanation of THRR including how the numbers were predicted and where they are located for reference       Expected Outcomes Short Term: Able to state/look up THRR;Long Term: Able to use THRR to govern intensity when exercising independently;Short Term: Able to use daily as guideline for intensity in rehab       Understanding of Exercise Prescription Yes       Intervention Provide education, explanation, and written materials on patient's individual exercise prescription       Expected Outcomes Short Term: Able to  explain program exercise prescription;Long Term: Able to explain home exercise prescription to exercise independently          Exercise Goals Re-Evaluation:  Exercise Goals Re-Evaluation     Row Name 05/25/24 1602             Exercise Goal Re-Evaluation   Exercise Goals Review Increase Physical Activity;Able to understand and use Dyspnea scale;Understanding of Exercise Prescription;Increase Strength and Stamina;Knowledge and understanding of Target Heart Rate Range (THRR);Able to understand and use rate of perceived exertion (RPE) scale       Comments Rhonda [name] is being discharged due to hospitalization. She has completed 1 exercise session.       Expected Outcomes Through exercise at rehab and home, the patient will decrease shortness of breath with daily activities and feel confident in carrying out an exercise regimen at home.          Nutrition & Weight - Outcomes:    Nutrition:  Nutrition Therapy & Goals - 05/11/24 1439       Nutrition Therapy   Diet Carbohydrate Consistent diet      Personal Nutrition Goals   Nutrition Goal Patient to identify strategies for improving blood sugar control with goal A1c <7%    Personal Goal #2 Patient to identify strategies for weight loss with goal of 0.5-2.0# per week    Comments Rhonda [name] has medical history of ILD, hyperlipidemia, DM2. She is currently working on decreasing prednisone  with prednisone   taper and starting cellcept . She reports concerns of significant weight gain and uncontrolled blood sugars due to chronic steriod use. She recently saw endocrinology on 04/19/24 and started on insulin  pump and continues Lantus  18u, metformin , Novolog  6 units before breakfast and lunch +4 units before dinner. She reports significant nutrition knowledge deficit; we did discuss eating frequency, the 15 rule, and benefits of protein + carbohydrate intake. She is scheduled for [facility] lung transplant evaluation. Patient will benefit from participation in pulmonary rehab for nutrition, exercise, and lifestyle modification.      Intervention Plan   Intervention Prescribe, educate and counsel regarding individualized specific dietary modifications aiming towards targeted core components such as weight, hypertension, lipid management, diabetes, heart failure and other comorbidities.;Nutrition handout(s) given to patient.    Expected Outcomes Short Term Goal: Understand basic principles of dietary content, such as calories, fat, sodium, cholesterol and nutrients.;Long Term Goal: Adherence to prescribed nutrition plan.          Nutrition Discharge:  Nutrition Assessments - 05/11/24 1535       Rate Your Plate Scores   Pre Score 74    Post Score --          Education Questionnaire Score:  Knowledge Questionnaire Score - 05/05/24 1146       Knowledge Questionnaire Score   Pre Score 17/18          Goals reviewed with patient; copy given to patient.

## 2024-05-30 NOTE — Telephone Encounter (Signed)
 Pharmacy Patient Advocate Encounter  Received notification from [employer] that Prior Authorization for Yupelri  175MCG/3ML solution  has been APPROVED from 05/30/2024 to 11/22/2024. Ran test claim, Copay is $50.00. This test claim was processed through [facility]- copay amounts may vary at other pharmacies due to pharmacy/plan contracts, or as the patient moves through the different stages of their insurance plan.   PA #/Case ID/Reference #: [id number]

## 2024-05-30 NOTE — Progress Notes (Addendum)
 PROGRESS NOTE    Rhonda [name]  FMW:[id number] DOB: February 01, 1951 DOA: 05/22/2024 PCP: Geronimo Amel, MD  Subjective: Pt seen and examined. Restarted eliquis . No GI bleeding. Ready to go home today.   Hospital Course: 73 y.o. female with interstitial lung disease, CAD, GERD, HTN, HLP chronic hypersensitive pneumonitis on CellCept  prednisone , presented to ED due to shortness of breath,. In ED afebrile hemodynamically stable.  PCCM consulted and admitted, Labs were obtained which showed glucose 234, creat 0.89, WBC 13.5, hemoglobin 13.7, troponin within normal limits, BNP 89, respiratory viral panel negative.  Lactic acid 2.1.  CXR>no acute infiltrates. CTA> Small bilateral Pes-heparin  drip started S/P bronchoscopy on 7/1.  7/4: Hemodynamically stable.  Developed bleeding per rectum, had 2 episodes this morning.  GI was consulted and holding Eliquis .  Hemoglobin decreased to 12.5 from 13.2 today. Received rituximab  infusion yesterday.  7/5: Hemodynamically stable, some improvement of hemoglobin to 13.8 but she continued to have bleeding per rectum.  GI is planning colonoscopy tomorrow morning.  We will keep holding Eliquis  until cleared from GI.  7/6: Vital stable with hemoglobin of 12.5 this morning.  Potassium 3.2 which is being repleted.  Colonoscopy today with anal fissure, internal hemorrhoids and multiple polyps in colon which were removed and sent for pathology. Pulmonary to continue Solu-Medrol  at 60 mg daily and 1 g of CellCept  and they will follow-up as outpatient  Subjective: Patient was seen after the colonoscopy.  Still having some bleeding per rectum.    Assessment and plan:  AoCHRF ILD flareup  Small bilateral PE-low clot burden-dvt study neg Chronic hypersensitive pneumonitis: Patient on chronic steroids and CellCept  at baseline presented with shortness of breath, CT imaging shows small bilateral PE, concern for ILD flareup PCCM following.  S/p bronchoscopy 7/1,  follow BAL studies. F/u culture, cytology.Cell count with monocyte, macrophage predominance. antibiotic with ceftriaxone , azithromycin ,  Received high-dose Solu-Medrol  and rituximab  Continuing steroid Pulmonary is on board CellCept  dose was increased and continuing Bactrim  Continue supple oxygen  to keep above 92%, continue plan of care as per pulmonary CT chest abdomen pelvis-7/2 no lymphadenopathy or metastatic disease Cont to Increase mobility with PT, mobilize  Bleeding per rectum.  Patient continued to have some bleeding per rectum, small decrease in hemoglobin to 12.5 from 13.2 initially and now seems stable at 13.8.  Patient was recently started on Eliquis . - Holding Eliquis   -GI was consulted-s/p colonoscopy today with anal fissure, internal hemorrhoid and multiple colonic polyps which were removed and sent for pathology. -Keep holding Eliquis  until cleared from GI   Diabetes mellitus type 2, uncontrolled with hyperglycemia: Uncontrolled in the setting of steroid A1c 7.2 on Semglee  15 units daily, SSI, monitor and adjust   Hyperlipidemia Continue Zetia , statin   Hypertension Stable, continue diltiazem , Toprol  XL   Vision changes with blurriness: Possibly due to hyperglycemia with steroid use ophthalmology evaluation rule out diabetic retinopathy.  Monitor   Assessment and Plan: * Acute on chronic respiratory failure with hypoxia (HCC) 05-29-2024 pt weaned down to 2 L/min. Pt has home O2 and home nebulizer machine.  05-30-2024 stable on 2 L/min. Ready for DC.  Acute pulmonary embolism (HCC) 05-29-2024 per GI, ok for pt to restart DOAC today. Pt wants to go home tomorrow. Repeat CBC in AM.  05-30-2024 restarted eliquis  5 mg bid. F/u with pulmonology to determine length of treatment. Given small PE burden, I would recommend only 3-6 months but defer final decision to PCCM.  Bleeding hemorrhoids 05-29-2024 continue with rectal NTG ointment to  help with healing.  05-30-2024  stable. No further bleeding. Continue with anusol  and ntg ointment.  Anal fissure 05-29-2024 continue with rectal NTG ointment to help with healing.  05-30-2024 stable. No further bleeding. Continue with anusol  and ntg ointment.    Rectal bleeding 05-29-2024 GI feels that rectal bleeding from anal fissure vs hemorrhoids.   05-30-2024 stable. No further bleeding. Continue with anusol  and ntg ointment.  Benign neoplasm of rectum 05-29-2024 had colonoscopy on 05-28-2024 with removal of several polyps. GI feels that rectal bleeding from anal fissure vs hemorrhoids.  05-30-2024 f/u with GI for biopsy results  Benign neoplasm of rectosigmoid junction 05-29-2024 had colonoscopy on 05-28-2024 with removal of several polyps. GI feels that rectal bleeding from anal fissure vs hemorrhoids.  05-30-2024 f/u with GI for biopsy results  Benign neoplasm of sigmoid colon 05-29-2024 had colonoscopy on 05-28-2024 with removal of several polyps. GI feels that rectal bleeding from anal fissure vs hemorrhoids.  05-30-2024 f/u with GI for biopsy results  Benign neoplasm of descending colon 05-29-2024 had colonoscopy on 05-28-2024 with removal of several polyps. GI feels that rectal bleeding from anal fissure vs hemorrhoids.  05-30-2024 f/u with GI for biopsy results  Benign neoplasm of transverse colon 05-29-2024 had colonoscopy on 05-28-2024 with removal of several polyps. GI feels that rectal bleeding from anal fissure vs hemorrhoids.  05-30-2024 f/u with GI for biopsy results  Benign neoplasm of ascending colon 05-29-2024 had colonoscopy on 05-28-2024 with removal of several polyps. GI feels that rectal bleeding from anal fissure vs hemorrhoids.  05-30-2024 f/u with GI for biopsy results  Benign neoplasm of cecum 05-29-2024 had colonoscopy on 05-28-2024 with removal of several polyps. GI feels that rectal bleeding from anal fissure vs hemorrhoids.  05-30-2024 f/u with GI for biopsy  results  Interstitial lung disease (HCC) 05-29-2024 PCCM signed off yesterday. On 60 mg prednisone  daily. F/u with PCCM in outpatient clinic. Continue with cellcept   05-30-2024 f/u with PCCM as outpatient. Continue prednisone  60 mg daily per Dr. Theophilus and cellcept  1000 mg bid. Pt states she feels she is breathing better with nebulizer meds instead of Breo inhaler. Will DC to home on pulmicort , Yulperi, Brovana  nebs.  Gastroesophageal reflux disease 05-29-2024 stable. On po protonix .  05-30-2024 continue PPI  Hypokalemia 05-29-2024 replete with po kcl   DVT prophylaxis: SCDs Start: 05/22/24 1756 apixaban  (ELIQUIS ) tablet 5 mg     Code Status: Full Code Family Communication: no family at bedside. Pt is decisional. Disposition Plan: return home Reason for continuing need for hospitalization: stable for DC.  Objective: Vitals:   05/29/24 1839 05/29/24 1923 05/30/24 0504 05/30/24 0800  BP:  (!) 140/72 125/79   Pulse:  92 68   Resp:  18 15   Temp:  98.4 F (36.9 C) 98.8 F (37.1 C)   TempSrc:      SpO2: 97% 99% 100% 99%  Weight:      Height:        Intake/Output Summary (Last 24 hours) at 05/30/2024 0917 Last data filed at 05/29/2024 1350 Gross per 24 hour  Intake 357 ml  Output --  Net 357 ml   Filed Weights   05/23/24 0856 05/23/24 1309 05/28/24 0911  Weight: 68.2 kg 67.1 kg 67.1 kg    Examination:  Physical Exam Vitals and nursing note reviewed.  Constitutional:      General: She is not in acute distress.    Appearance: She is obese. She is not toxic-appearing.  HENT:  Head: Normocephalic and atraumatic.     Nose: Nose normal.  Eyes:     General: No scleral icterus. Cardiovascular:     Rate and Rhythm: Normal rate and regular rhythm.  Pulmonary:     Effort: Pulmonary effort is normal.     Comments: Coarse BS. No wheezing Abdominal:     General: Bowel sounds are normal. There is no distension.     Palpations: Abdomen is soft.  Musculoskeletal:      Comments: Trace edema bilateral ankles No edema of her feet  Skin:    General: Skin is warm and dry.     Capillary Refill: Capillary refill takes less than 2 seconds.  Neurological:     General: No focal deficit present.     Mental Status: She is alert and oriented to person, place, and time.     Data Reviewed: I have personally reviewed following labs and imaging studies  CBC: Recent Labs  Lab 05/26/24 1045 05/27/24 0632 05/28/24 0425 05/29/24 0908 05/30/24 0823  WBC 16.2* 17.7* 12.4* 13.6* 18.3*  NEUTROABS  --   --   --   --  15.9*  HGB 12.5 13.8 12.5 13.2 12.6  HCT 40.8 45.0 39.9 41.5 39.9  MCV 90.9 90.7 88.3 88.7 89.9  PLT 182 197 144* 169 168   Basic Metabolic Panel: Recent Labs  Lab 05/25/24 0527 05/26/24 0337 05/27/24 0632 05/28/24 0425 05/29/24 0908  NA 137 137 139 139 136  K 4.3 4.3 3.9 3.2* 3.3*  CL 103 101 99 99 99  CO2 27 25 28 29 24   GLUCOSE 228* 217* 177* 148* 301*  BUN 27* 30* 25* 21 31*  CREATININE 0.91 0.67 0.93 0.61 0.83  CALCIUM  8.8* 8.5* 8.8* 8.2* 8.6*  MG  --   --   --  2.6*  --   PHOS 3.9 3.3 4.3 3.8  --    GFR: Estimated Creatinine Clearance: 48.8 mL/min (by C-G formula based on SCr of 0.83 mg/dL). Liver Function Tests: Recent Labs  Lab 05/25/24 0527 05/26/24 0337 05/27/24 0632 05/28/24 0425 05/29/24 0908  AST  --   --   --   --  25  ALT  --   --   --   --  45*  ALKPHOS  --   --   --   --  35*  BILITOT  --   --   --   --  1.0  PROT  --   --   --   --  5.6*  ALBUMIN 3.1* 2.8* 3.1* 2.8* 3.1*   BNP (last 3 results) Recent Labs    03/14/24 1248 03/16/24 0550 05/22/24 1302  BNP 60.8 77.5 89.7   CBG: Recent Labs  Lab 05/29/24 0722 05/29/24 1125 05/29/24 1637 05/29/24 2049 05/30/24 0723  GLUCAP 242* 275* 281* 322* 216*   Recent Results (from the past 240 hours)  Culture, blood (routine x 2)     Status: None   Collection Time: 05/22/24 12:50 PM   Specimen: BLOOD  Result Value Ref Range Status   Specimen Description    Final    BLOOD LEFT ANTECUBITAL Performed at [facility], [address] [place], [place] [address]    Special Requests   Final    BOTTLES DRAWN AEROBIC AND ANAEROBIC Blood Culture results may not be optimal due to an inadequate volume of blood received in culture bottles Performed at [facility], [address], [place], [place] [address]  Culture   Final    NO GROWTH 5 DAYS Performed at [facility], [address], [place], [place] [address]    Report Status 05/27/2024 FINAL  Final  Culture, blood (routine x 2)     Status: None   Collection Time: 05/22/24  1:27 PM   Specimen: BLOOD  Result Value Ref Range Status   Specimen Description   Final    BLOOD BLOOD LEFT FOREARM Performed at [facility], [address] [place], [place] [address]    Special Requests   Final    BOTTLES DRAWN AEROBIC AND ANAEROBIC Blood Culture results may not be optimal due to an inadequate volume of blood received in culture bottles Performed at [facility], [address], [place], [place] [address]    Culture   Final    NO GROWTH 5 DAYS Performed at [facility], [address], [place], [place] [address]    Report Status 05/27/2024 FINAL  Final  Respiratory (~20 pathogens) panel by PCR     Status: None   Collection Time: 05/22/24  4:12 PM   Specimen: Nasopharyngeal Swab; Respiratory  Result Value Ref Range Status   Adenovirus NOT DETECTED NOT DETECTED Final   Coronavirus 229E NOT DETECTED NOT DETECTED Final    Comment: (NOTE) The Coronavirus on the Respiratory Panel, DOES NOT test for the novel  Coronavirus (2019 nCoV)    Coronavirus HKU1 NOT DETECTED NOT DETECTED Final   Coronavirus NL63 NOT DETECTED NOT DETECTED Final   Coronavirus OC43 NOT DETECTED NOT DETECTED Final   Metapneumovirus NOT DETECTED NOT DETECTED Final   Rhinovirus / Enterovirus NOT DETECTED NOT DETECTED Final   Influenza A NOT  DETECTED NOT DETECTED Final   Influenza B NOT DETECTED NOT DETECTED Final   Parainfluenza Virus 1 NOT DETECTED NOT DETECTED Final   Parainfluenza Virus 2 NOT DETECTED NOT DETECTED Final   Parainfluenza Virus 3 NOT DETECTED NOT DETECTED Final   Parainfluenza Virus 4 NOT DETECTED NOT DETECTED Final   Respiratory Syncytial Virus NOT DETECTED NOT DETECTED Final   Bordetella pertussis NOT DETECTED NOT DETECTED Final   Bordetella Parapertussis NOT DETECTED NOT DETECTED Final   Chlamydophila pneumoniae NOT DETECTED NOT DETECTED Final   Mycoplasma pneumoniae NOT DETECTED NOT DETECTED Final    Comment: Performed at [facility], [address] [place], [place] [address]  Pneumocystis smear by DFA     Status: None   Collection Time: 05/23/24  2:11 PM   Specimen: Bronchial Alveolar Lavage; Respiratory  Result Value Ref Range Status   Specimen Source-PJSRC BRONCHIAL ALVEOLAR LAVAGE  Final    Comment: RUL Performed at [facility], [address] [place], [place] [address]    Pneumocystis jiroveci Ag NEGATIVE  Final    Comment: Performed at [facility], [address], [place], [place] [address]  Fungus Culture With Stain     Status: None (Preliminary result)   Collection Time: 05/23/24  2:11 PM   Specimen: Bronchial Alveolar Lavage; Respiratory  Result Value Ref Range Status   Fungus Stain Final report  Final    Comment: (NOTE) Performed At: [facility] [address] [place], [place] [id number] Jennette Shorter MD Ey:[id number]    Fungus (Mycology) Culture PENDING  Incomplete   Fungal Source BRONCHIAL ALVEOLAR LAVAGE  Final    Comment: RUL Performed at [facility], [address], [place], [place] [address]   Culture, BAL-quantitative w  Gram Stain     Status: None   Collection Time: 05/23/24  2:11 PM   Specimen: Bronchial Alveolar Lavage; Respiratory  Result Value Ref Range Status   Specimen Description BRONCHIAL ALVEOLAR LAVAGE  Final    Special Requests RUL  Final   Gram Stain   Final    RARE WBC PRESENT, PREDOMINANTLY PMN NO ORGANISMS SEEN    Culture   Final    NO GROWTH 2 DAYS Performed at [facility], [address], [place], [place] [address]    Report Status 05/26/2024 FINAL  Final  Acid Fast Smear (AFB)     Status: None   Collection Time: 05/23/24  2:11 PM   Specimen: Bronchial Alveolar Lavage; Respiratory  Result Value Ref Range Status   AFB Specimen Processing Concentration  Final   Acid Fast Smear Negative  Final    Comment: (NOTE) Performed At: [facility] [address] [place], [place] [id number] Jennette Shorter MD Ey:[id number]    Source (AFB) BRONCHIAL ALVEOLAR LAVAGE  Final    Comment: RUL Performed at [facility], [address] [place], [place] [address]   Anaerobic culture w Gram Stain     Status: None   Collection Time: 05/23/24  2:11 PM   Specimen: Bronchoalveolar Lavage  Result Value Ref Range Status   Specimen Description BRONCHIAL ALVEOLAR LAVAGE  Final   Special Requests RUL  Final   Gram Stain   Final    RARE WBC PRESENT, PREDOMINANTLY PMN NO ORGANISMS SEEN    Culture   Final    NO ANAEROBES ISOLATED Performed at [facility], [address] [place], [place] [address]    Report Status 05/28/2024 FINAL  Final  Fungus Culture Result     Status: None   Collection Time: 05/23/24  2:11 PM  Result Value Ref Range Status   Result 1 Comment  Final    Comment: (NOTE) KOH/Calcofluor preparation:  no fungus observed. Performed At: [facility] [address] [place], [place] [id number] Jennette Shorter MD Ey:[id number]   Fungus Culture With Stain     Status: None (Preliminary result)   Collection Time: 05/23/24  2:17 PM   Specimen: Bronchial Alveolar Lavage; Respiratory  Result Value Ref Range Status   Fungus Stain Final report  Final    Comment: (NOTE) Performed At: [facility] [address] [place], [place] [id number] Jennette Shorter MD Ey:[id number]    Fungus (Mycology) Culture PENDING  Incomplete   Fungal Source BRONCHIAL ALVEOLAR LAVAGE  Final    Comment: LUL Performed at [facility], [address] [place], [place] [address]   Culture, BAL-quantitative w Gram Stain     Status: None   Collection Time: 05/23/24  2:17 PM   Specimen: Bronchial Alveolar Lavage; Respiratory  Result Value Ref Range Status   Specimen Description   Final    BRONCHIAL ALVEOLAR LAVAGE Performed at [facility], [address], [place], [place] [address]    Special Requests LUL  Final   Gram Stain   Final    RARE WBC PRESENT, PREDOMINANTLY PMN NO ORGANISMS SEEN    Culture   Final    NO GROWTH 2 DAYS Performed at [facility], [address], [place], [place] [address]    Report Status 05/26/2024 FINAL  Final  Acid Fast Smear (AFB)     Status: None   Collection Time: 05/23/24  2:17 PM   Specimen: Bronchial Alveolar  Lavage; Respiratory  Result Value Ref Range Status   AFB Specimen Processing Concentration  Final   Acid Fast Smear Negative  Final    Comment: (NOTE) Performed At: [facility] [address] [place], [place] [id number] Jennette Shorter MD Ey:[id number]    Source (AFB) BRONCHIAL ALVEOLAR LAVAGE  Final    Comment: LUL Performed at [facility], [address], [place], [place] [address]   Anaerobic culture w Gram Stain     Status: None   Collection Time: 05/23/24  2:17 PM   Specimen: Bronchoalveolar Lavage  Result Value Ref Range Status   Specimen Description BRONCHIAL ALVEOLAR LAVAGE  Final   Special Requests LUL  Final   Gram Stain   Final    RARE WBC PRESENT, PREDOMINANTLY PMN NO ORGANISMS SEEN    Culture   Final    NO ANAEROBES ISOLATED Performed at [facility], [address], [place], [place] [address]    Report Status 05/28/2024 FINAL  Final  Fungus Culture Result     Status: None   Collection Time: 05/23/24  2:17 PM  Result Value Ref  Range Status   Result 1 Comment  Final    Comment: (NOTE) KOH/Calcofluor preparation:  no fungus observed. Performed At: [facility] [address] [place], [place] [id number] Nagendra Sanjai MD Ey:[id number]     Scheduled Meds:  apixaban   5 mg Oral BID   arformoterol   15 mcg Nebulization BID   budesonide   0.5 mg Nebulization BID   Chlorhexidine  Gluconate Cloth  6 each Topical Daily   dicyclomine   10 mg Oral BID AC   diltiazem   120 mg Oral Daily   famotidine   20 mg Oral BID   hydrocortisone   25 mg Rectal QHS   insulin  aspart  0-24 Units Subcutaneous TID WC   insulin  glargine-yfgn  15 Units Subcutaneous Daily   metoprolol  succinate  25 mg Oral Daily   mycophenolate   1,000 mg Oral BID   nitroGLYCERIN   0.5 inch Topical BID   pantoprazole   40 mg Oral Daily   pravastatin   40 mg Oral QPM   predniSONE   60 mg Oral Q breakfast   revefenacin   175 mcg Nebulization Daily   sodium chloride  flush  10-40 mL Intracatheter Q12H   sulfamethoxazole -trimethoprim   1 tablet Oral Once per day on Monday Wednesday Friday   Continuous Infusions:  sodium chloride        LOS: 8 days   Time spent: 55 minutes  Camellia Door, DO  [facility]  05/30/2024, 9:17 AM

## 2024-05-30 NOTE — Care Management Important Message (Signed)
 Important Message  Patient Details 2nd  IM Letter mailed to Patient. Name: Rhonda [name] MRN: [id number] Date of Birth: 02/19/1951   Important Message Given:  mailed     Melba Ates 05/30/2024, 12:12 PM

## 2024-05-30 NOTE — Progress Notes (Signed)
 AVS reviewed w/ pt who verbalized an understanding.No other questions. Pt dressed for d/c to home. Midline removed as noted. Pt ha own O2 travel tank, connected self- pt to lobby via w/c , home with husband.

## 2024-05-30 NOTE — Addendum Note (Signed)
 Encounter addended by: Claudene Augustin NOVAK, RRT on: 05/30/2024 3:49 PM  Actions taken: Flowsheet data copied forward, Flowsheet accepted, Clinical Note Signed, Episode resolved

## 2024-05-31 ENCOUNTER — Ambulatory Visit: Payer: Self-pay | Admitting: Internal Medicine

## 2024-05-31 ENCOUNTER — Encounter: Payer: Self-pay | Admitting: Pharmacist

## 2024-05-31 ENCOUNTER — Other Ambulatory Visit: Payer: Self-pay | Admitting: Pharmacist

## 2024-05-31 ENCOUNTER — Telehealth: Payer: Self-pay | Admitting: Internal Medicine

## 2024-05-31 NOTE — Progress Notes (Signed)
 Therapy plan placed for Ruxience  [phone]) at [facility] [phone]) [place] Morita to start benefits investigation  Diagnosis:  Provider: Dr. Praveen Mannam  Dose: 1000mg  at Day 0 and Day 14. (Of note, Day 0 dose was ocmpleted inpatient on 05/25/2024. Day 14 dose is due on 06/08/2024) Premedications: acetaminophen  650mg  p.o., diphenhydramine  25mg  p.o., and Solumedrol 125mg  IV  Last infusion: 05/25/2024 Last Clinic Visit: 5/22/205 Next Clinic Visit: 06/14/2024  Pertinent Labs: CBC on 05/30/2024, TB gold negative on 02/07/2024; hepatitis negative on 3/13/205  Sherry Pennant, PharmD, MPH, BCPS, CPP Clinical Pharmacist (Rheumatology and Pulmonology)

## 2024-05-31 NOTE — Telephone Encounter (Signed)
 Patent texted me yesterday with post discharge pedal edema. I claled in lasix and KCL for 1 week. Just fyi

## 2024-05-31 NOTE — Telephone Encounter (Signed)
 Referral placed for Ruxience  Day 14 dose at [facility]. Due on  06/08/2024

## 2024-06-01 ENCOUNTER — Telehealth: Payer: Self-pay

## 2024-06-01 ENCOUNTER — Encounter (HOSPITAL_COMMUNITY)

## 2024-06-01 NOTE — Telephone Encounter (Signed)
 Dr. Geronimo and [name], patient will be scheduled as soon as possible.  Auth Submission: APPROVED Site of care: Site of care: [facility] Payer: [employer] medicare Medication & CPT/J Code(s) submitted: Ruxience  (Rituximab -pvvr) [id number] Diagnosis Code:  Route of submission (phone, fax, portal): portal Phone # Fax # Auth type: Buy/Bill PB Units/visits requested: 1000mg  x 1 dose Reference number: [id number] Approval from: 05/31/24 to 11/22/24

## 2024-06-01 NOTE — Telephone Encounter (Signed)
 Please schedule for no sooner than 06/08/2024 (this is when Day 14 dose is due).  Thank you  Sherry Pennant, PharmD, MPH, BCPS, CPP Clinical Pharmacist (Rheumatology and Pulmonology)

## 2024-06-02 ENCOUNTER — Telehealth: Payer: Self-pay | Admitting: Cardiology

## 2024-06-02 MED ORDER — NITROGLYCERIN 0.4 MG SL SUBL: 0.4000 mg | SUBLINGUAL_TABLET | SUBLINGUAL | 3 refills | Status: DC | PRN

## 2024-06-02 NOTE — Progress Notes (Signed)
 Ruxience  infusion scheduled for 06/12/2024

## 2024-06-02 NOTE — Telephone Encounter (Signed)
 RX sent to requested Pharmacy

## 2024-06-02 NOTE — Telephone Encounter (Signed)
*  STAT* If patient is at the pharmacy, call can be transferred to refill team.   1. Which medications need to be refilled? (please list name of each medication and dose if known) nitroGLYCERIN  (NITROSTAT ) 0.4 MG SL tablet (Expired)    2. Would you like to learn more about the convenience, safety, & potential cost savings by using the [facility]?    3. Are you open to using the [facility] (Type [facility].  ).   4. Which pharmacy/location (including street and city if local pharmacy) is medication to be sent to? [facility] [place], [place] - [address]    5. Do they need a 30 day or 90 day supply? 90 day

## 2024-06-04 ENCOUNTER — Emergency Department (HOSPITAL_COMMUNITY)

## 2024-06-04 ENCOUNTER — Encounter (HOSPITAL_COMMUNITY): Payer: Self-pay

## 2024-06-04 ENCOUNTER — Emergency Department (HOSPITAL_COMMUNITY)
Admission: EM | Admit: 2024-06-04 | Discharge: 2024-06-04 | Disposition: A | Discharge status: Home / Self Care | Attending: Emergency Medicine | Admitting: Emergency Medicine

## 2024-06-04 ENCOUNTER — Other Ambulatory Visit: Payer: Self-pay

## 2024-06-04 DIAGNOSIS — Z85828 Personal history of other malignant neoplasm of skin: Secondary | ICD-10-CM | POA: Diagnosis not present

## 2024-06-04 DIAGNOSIS — S0990XA Unspecified injury of head, initial encounter: Secondary | ICD-10-CM | POA: Diagnosis present

## 2024-06-04 DIAGNOSIS — Z7951 Long term (current) use of inhaled steroids: Secondary | ICD-10-CM | POA: Diagnosis not present

## 2024-06-04 DIAGNOSIS — W0110XA Fall on same level from slipping, tripping and stumbling with subsequent striking against unspecified object, initial encounter: Secondary | ICD-10-CM | POA: Diagnosis not present

## 2024-06-04 DIAGNOSIS — J4530 Mild persistent asthma, uncomplicated: Secondary | ICD-10-CM | POA: Diagnosis not present

## 2024-06-04 DIAGNOSIS — Z7901 Long term (current) use of anticoagulants: Secondary | ICD-10-CM | POA: Insufficient documentation

## 2024-06-04 DIAGNOSIS — E119 Type 2 diabetes mellitus without complications: Secondary | ICD-10-CM | POA: Diagnosis not present

## 2024-06-04 DIAGNOSIS — Z794 Long term (current) use of insulin: Secondary | ICD-10-CM | POA: Diagnosis not present

## 2024-06-04 DIAGNOSIS — Z7984 Long term (current) use of oral hypoglycemic drugs: Secondary | ICD-10-CM | POA: Diagnosis not present

## 2024-06-04 NOTE — ED Triage Notes (Signed)
 Patient said she felt dizzy because she has been on steroids and fell and hit her head. Did not pass out. Takes eloquis. In the hospital last week due to lung failure.

## 2024-06-04 NOTE — Discharge Instructions (Signed)
 It was a pleasure caring for you today in the emergency department.  Please follow closely with your PCP in the office.  Be sure to move very slowly from a seated to a standing position.  If you feel like you are lightheaded or going to pass out please carefully sit back down and wait until the sensation passes before attempting to stand up again.  Please return to the emergency department for any worsening or worrisome symptoms.

## 2024-06-04 NOTE — ED Provider Notes (Signed)
 [place] EMERGENCY DEPARTMENT AT [facility] Provider Note  CSN: [id number] Arrival date & time: 06/04/24 1308  Chief Complaint(s) Fall  HPI Rhonda [name] is a 73 y.o. female with past medical history as below, significant for GERD, IBS, MVP, pulmonary fibrosis, anticoagulated who presents to the ED with complaint of fall  Hospitalized secondary to PE, she was started on oral anticoagulation.  She felt lightheaded, she fell and hit her head.  No syncope. Pulm Dr Geronimo sent her to ED for eval.  She has no headache, no neck pain, she has frequent feel lightheaded when she attempts to stand which is been an ongoing issue for her for the past few months.  No vision or hearing changes, no numbness or weakness of extremities.  She denies any acute injuries at this time  Past Medical History Past Medical History:  Diagnosis Date   Allergy    Arthritis    history spinal stenosis. osteoarthritis right hip   Asthma    Cataract    Coronary artery calcification seen on CAT scan 08/19/2017   >300 on CT scan 08/2017   DDD (degenerative disc disease), thoracic    Depression    DOE (dyspnea on exertion)    a. 04/2010 Lexi MV EF 71%, no ischemia/infarct;     Fatty liver    GERD (gastroesophageal reflux disease)    H/O steroid therapy    Steroid use orally over 4 yrs- for Lung Fibrosis   Heart palpitations 02/28/2015   Helicobacter pylori ab+    Hemorrhoids    Hiatal hernia    High cholesterol    History of chronic bronchitis    as child   History of migraine    History of MRSA infection    Hyperlipidemia, mixed 08/19/2017   Hyperplastic colon polyp 2007   IBS (irritable bowel syndrome)    Inguinal hernia    right   Insulin  resistance    past   Interstitial lung disease (HCC)    MVP (mitral valve prolapse)    Posterior mitral valve leaflet with mild MR   Pneumonia    Pneumonitis, hypersensitivity (HCC)    a. 09/2012 s/p Bx - ? 2/2 bird, mold, oil paint exposure  ->on steroids, followed by pulm.   PONV (postoperative nausea and vomiting)    Pre-diabetes    takes Metformin    Pulmonary fibrosis (HCC)    Dr. Geronimo follows- stable at present   PVC (premature ventricular contraction) 08/19/2017   Rapid heart rate    Dr. Shlomo follows- last visit Epic note 9'16   Squamous cell carcinoma of skin    Tinnitus, right ear    Vocal cord ulcer    Patient Active Problem List   Diagnosis Date Noted   Chronic anticoagulation 05/30/2024   History of colonic polyps 05/30/2024   Hypokalemia 05/29/2024   Benign neoplasm of cecum 05/28/2024   Benign neoplasm of ascending colon 05/28/2024   Benign neoplasm of transverse colon 05/28/2024   Benign neoplasm of descending colon 05/28/2024   Benign neoplasm of sigmoid colon 05/28/2024   Benign neoplasm of rectosigmoid junction 05/28/2024   Benign neoplasm of rectum 05/28/2024   Rectal bleeding 05/27/2024   Anal fissure 05/27/2024   Bleeding hemorrhoids 05/27/2024   Acute pulmonary embolism (HCC) 05/24/2024   CAP (community acquired pneumonia) 03/07/2024   Diabetes mellitus, type II (HCC) 02/18/2024   Pulmonary HTN (HCC) 03/06/2020   Chronic respiratory failure with hypoxia (HCC) 12/14/2019   Overweight (BMI 25.0-29.9) 10/04/2019  S/P hip replacement, left 10/03/2019   Pain of left hip joint 08/20/2019   Yeast dermatitis 04/25/2018   Interstitial lung disease (HCC) 04/25/2018   Coronary artery calcification seen on CAT scan 08/19/2017   Mixed hyperlipidemia 08/19/2017   PVC (premature ventricular contraction) 08/19/2017   Acute on chronic respiratory failure with hypoxia (HCC) 03/18/2017   Erroneous encounter - disregard 02/17/2017   Irritable larynx syndrome 01/13/2016   Allergic rhinitis due to pollen 01/06/2016   Mild persistent asthma 01/06/2016   Allergy with anaphylaxis due to food 01/06/2016   Current use of beta blocker 01/06/2016   Gastroesophageal reflux disease 01/06/2016   Tachycardia  01/06/2016   H/O food anaphylaxis 01/06/2016   Acute bronchitis 10/11/2015   Obese 09/11/2015   S/P right THA, AA 09/10/2015   Pre-diabetes 08/22/2015   Preop pulmonary/respiratory exam 07/08/2015   MVP (mitral valve prolapse)    Heart palpitations 02/28/2015   Right-sided chest wall pain 12/19/2014   Cough 12/19/2014   Wheezing 10/20/2014   ILD (interstitial lung disease) (HCC) 09/20/2014   Upper respiratory infection 08/25/2014   Irritable bowel syndrome (IBS) 02/08/2014   Spontaneous pneumothorax- Right 08/08/2013   High risk medication use 01/03/2013   Osteopenia 10/28/2012   Metabolic syndrome 09/20/2012   Hypersensitivity pneumonitis due to bird exposure, ? oil pain and ? mold in house 10/29/2011   Snoring 10/29/2011   Dyslipidemia 04/02/2010   ABNORMAL EKG 04/02/2010   DEPRESSION 03/27/2010   MITRAL REGURGITATION 03/27/2010   DYSPNEA ON EXERTION 03/27/2010   Home Medication(s) Prior to Admission medications   Medication Sig Start Date End Date Taking? Authorizing Provider  albuterol  (PROAIR  HFA) 108 (90 Base) MCG/ACT inhaler Inhale 2 puffs into the lungs every 4 (four) hours as needed for wheezing. Or coughing spells.  You may use 2 Puffs 5-10 minutes before exercise. Patient taking differently: Inhale 2 puffs into the lungs every 4 (four) hours as needed for wheezing (or coughing. May also use 2 puffs 5-10 minutes before exercise.). 03/17/24   Amin, Ankit C, MD  albuterol  (PROVENTIL ) (2.5 MG/3ML) 0.083% nebulizer solution Take 3 mLs (2.5 mg total) by nebulization every 6 (six) hours as needed for wheezing or shortness of breath. 05/29/24 06/29/24  Laurence Locus, DO  Alpha-D-Galactosidase (BEANO PO) Take 1 tablet by mouth as needed (as directed, if eating onion or garlic).    [provider]  ALPRAZolam  (XANAX ) 0.25 MG tablet Take 0.25 mg by mouth at bedtime as needed for anxiety or sleep.    [provider]  AMBULATORY NON FORMULARY MEDICATION See admin  instructions. Allergy injections on Mondays and hold when sick Patient not taking: Reported on 05/22/2024    [provider]  apixaban  (ELIQUIS ) 5 MG TABS tablet Take 1 tablet (5 mg total) by mouth 2 (two) times daily. 05/29/24 06/29/24  Laurence Locus, DO  arformoterol  (BROVANA ) 15 MCG/2ML NEBU Take 2 mLs (15 mcg total) by nebulization 2 (two) times daily. 05/29/24 06/29/24  Laurence Locus, DO  ascorbic acid (VITAMIN C) 500 MG tablet Take 500 mg by mouth daily.    [provider]  ASHWAGANDHA PO Take 1 capsule by mouth every other day.    [provider]  azelastine (ASTELIN) 0.1 % nasal spray Place 1 spray into both nostrils in the morning. Use in each nostril as directed    [provider]  budesonide  (PULMICORT ) 0.5 MG/2ML nebulizer solution Take 2 mLs (0.5 mg total) by nebulization in the morning and at bedtime. 05/29/24 06/29/24  Laurence Locus, DO  Calcium  Carb-Cholecalciferol (CALCIUM  500 + D PO) Take 1 tablet by mouth daily.    [provider]  chlorpheniramine (CHLOR-TRIMETON) 4 MG tablet Take 2 mg by mouth 2 (two) times daily as needed for allergies or rhinitis.    [provider]  cholecalciferol (VITAMIN D3) 25 MCG (1000 UNIT) tablet Take 1,000 Units by mouth every other day.    [provider]  Continuous Glucose Sensor (DEXCOM G7 SENSOR) MISC Check blood sugar fasting and before meals and at bedtime. 04/23/24     dicyclomine  (BENTYL ) 10 MG capsule Take 10 mg by mouth 2 (two) times daily as needed for spasms. 08/16/18   [provider]  diltiazem  (CARDIZEM  CD) 120 MG 24 hr capsule Take 1 capsule (120 mg total) by mouth daily. 03/24/24   Lavona Agent, MD  EPINEPHrine  0.3 mg/0.3 mL IJ SOAJ injection Inject 0.3 mg into the muscle as needed for anaphylaxis.    [provider]  ezetimibe  (ZETIA ) 10 MG tablet Take 1 tablet (10 mg total) by mouth daily. Patient taking differently: Take 10 mg by mouth at bedtime. 02/10/24   Lavona Agent,  MD  fluticasone  (FLONASE ) 50 MCG/ACT nasal spray Spray 2 sprays into each nostril every day. Patient taking differently: Place 2 sprays into both nostrils in the morning. 03/06/24   Geronimo Amel, MD  HUMALOG  KWIKPEN 100 UNIT/ML KwikPen Inject 8-10 Units into the skin See admin instructions. Inject 10 units into the skin before breakfast & lunch and 8 before supper 01/27/24   [provider]  hydrocortisone  (ANUSOL -HC) 25 MG suppository Place 1 suppository (25 mg total) rectally at bedtime for 12 days. 05/29/24 06/11/24  Laurence Locus, DO  Hypromellose (NATURES TEARS OP) Place 1 drop into both eyes in the morning and at bedtime.    [provider]  Insulin  Disposable Pump (OMNIPOD 5 G7 PODS, GEN 5,) MISC Inject 1 Device into the skin every 3 (three) days.    [provider]  levocetirizine (XYZAL ) 5 MG tablet Take 5 mg by mouth at bedtime. 09/26/20   [provider]  metFORMIN  (GLUCOPHAGE -XR) 500 MG 24 hr tablet Take 500 mg by mouth daily with breakfast. 07/08/23   [provider]  metoprolol  succinate (TOPROL -XL) 25 MG 24 hr tablet Take 1 tablet (25 mg total) by mouth daily. 06/10/23   Lavona Agent, MD  montelukast  (SINGULAIR ) 10 MG tablet TAKE 1 TABLET BY MOUTH EVERYDAY AT BEDTIME Patient taking differently: Take 10 mg by mouth at bedtime. 01/29/20   Geronimo Amel, MD  Multiple Vitamin (MULTIVITAMIN) tablet Take 1 tablet by mouth daily with breakfast.    [provider]  mycophenolate  (CELLCEPT ) 500 MG tablet Take 2 tablets (1,000 mg total) by mouth 2 (two) times daily. 05/29/24 06/29/24  Laurence Locus, DO  nitroGLYCERIN  (NITROGLYN) 2 % ointment Apply 0.25 inches topically 2 (two) times daily. Apply To anus and surrounding tissue. Use pea size amount 05/29/24   Laurence Locus, DO  nitroGLYCERIN  (NITROSTAT ) 0.4 MG SL tablet Place 1 tablet (0.4 mg total) under the tongue every 5 (five) minutes as needed for chest pain. 06/02/24 08/31/24  Lavona Agent, MD   nystatin  cream (MYCOSTATIN ) Apply 1 Application topically 2 (two) times daily as needed (for irritation- affected sites).    [provider]  omeprazole  (PRILOSEC  OTC) 20 MG tablet Take 10 mg by mouth See admin instructions. Take 10 mg by mouth at 4:30 AM    [provider]  OXYGEN  Inhale 4-5 L/min  into the lungs See admin instructions. Inhale 4-5 L/min at bedtime and as needed for shortness of breath during the day    [provider]  pravastatin  (PRAVACHOL ) 40 MG tablet Take 1 tablet (40 mg total) by mouth every evening. Patient taking differently: Take 40 mg by mouth at bedtime. 06/12/20   Lavona Agent, MD  predniSONE  (DELTASONE ) 20 MG tablet Take 3 tablets (60 mg total) by mouth daily with breakfast. 05/30/24 06/29/24  Laurence Locus, DO  PREPARATION H 0.25-50 % GEL Place 1 application  rectally 2 (two) times daily as needed (for hemorrhoids).    [provider]  revefenacin  (YUPELRI ) 175 MCG/3ML nebulizer solution Inhale 1 vial by nebulization daily. 05/30/24 06/29/24  Laurence Locus, DO  sodium chloride  (OCEAN) 0.65 % SOLN nasal spray Place 1 spray into both nostrils as needed for congestion.    [provider]  Sodium Chloride , Inhalant, 7 % NEBU Use per nebulizer once a day to help clear sputum 07/08/21   Geronimo Amel, MD  sulfamethoxazole -trimethoprim  (BACTRIM  DS) 800-160 MG tablet Take 1 tablet by mouth every Monday, Wednesday, and Friday.    [provider]                                                                                                                                    Past Surgical History Past Surgical History:  Procedure Laterality Date   13 HOUR PH STUDY N/A 02/21/2018   Procedure: 24 HOUR PH STUDY;  Surgeon: Shila Gustav GAILS, MD;  Location: [facility];  Service: Endoscopy;  Laterality: N/A;   BREAST BIOPSY Right 2009   BIOPSY, pt denies   BREAST BIOPSY Left 2003   Benign    CATARACT EXTRACTION Left    CESAREAN  SECTION     COLONOSCOPY     COLONOSCOPY N/A 05/28/2024   Procedure: COLONOSCOPY;  Surgeon: Albertus Gordy HERO, MD;  Location: [facility];  Service: Gastroenterology;  Laterality: N/A;   ESOPHAGEAL MANOMETRY N/A 02/21/2018   Procedure: ESOPHAGEAL MANOMETRY (EM);  Surgeon: Shila Gustav GAILS, MD;  Location: [facility];  Service: Endoscopy;  Laterality: N/A;   FOOT FRACTURE SURGERY  2006 or 2007   right   HYMENECTOMY     LUNG BIOPSY  09/28/2012   Procedure: LUNG BIOPSY;  Surgeon: Elspeth JAYSON Millers, MD;  Location: [facility] OR;  Service: Thoracic;  Laterality: N/A;  lung biopsies tims three   RIGHT HEART CATH N/A 04/04/2024   Procedure: RIGHT HEART CATH;  Surgeon: Rolan Ezra RAMAN, MD;  Location: [facility] INVASIVE CV LAB;  Service: Cardiovascular;  Laterality: N/A;   SQUAMOUS CELL CARCINOMA EXCISION Left    left arm   TOTAL HIP ARTHROPLASTY Right 09/10/2015   Procedure: RIGHT TOTAL HIP ARTHROPLASTY ANTERIOR APPROACH;  Surgeon: Donnice Car, MD;  Location: [facility] ORS;  Service: Orthopedics;  Laterality: Right;   TOTAL HIP ARTHROPLASTY Left 10/03/2019   Procedure: TOTAL HIP ARTHROPLASTY ANTERIOR APPROACH;  Surgeon: Ernie Cough, MD;  Location: [facility];  Service: Orthopedics;  Laterality: Left;  70 mins   TUBAL LIGATION     UPPER GI ENDOSCOPY     VIDEO ASSISTED THORACOSCOPY  09/28/2012   Procedure: VIDEO ASSISTED THORACOSCOPY;  Surgeon: Elspeth JAYSON Millers, MD;  Location: [facility] OR;  Service: Thoracic;  Laterality: Right;   VIDEO BRONCHOSCOPY  11/19/2011   Procedure: VIDEO BRONCHOSCOPY WITH FLUORO;  Surgeon: Dorethia Cave, MD;  Location: [facility] ENDOSCOPY;  Service: Endoscopy;;   VIDEO BRONCHOSCOPY Bilateral 05/23/2024   Procedure: VIDEO BRONCHOSCOPY WITHOUT FLUORO;  Surgeon: Mannam, Praveen, MD;  Location: [facility];  Service: Cardiopulmonary;  Laterality: Bilateral;   Family History Family History  Problem Relation Age of Onset   Asthma Mother    Osteoarthritis Mother    Dementia Mother 27   Lymphoma Father     Diabetes Father    Hypertension Sister    Heart disease Sister    Kidney disease Sister    Allergic rhinitis Daughter    Ovarian cancer Maternal Aunt    Breast cancer Paternal Aunt    Breast cancer Paternal Aunt    Emphysema Maternal Grandmother    Asthma Maternal Grandmother    Lung disease Maternal Grandfather    Bone cancer Paternal Grandfather    Breast cancer Cousin    Breast cancer Cousin    Breast cancer Cousin    Colon cancer Neg Hx    Angioedema Neg Hx    Eczema Neg Hx    Immunodeficiency Neg Hx    Urticaria Neg Hx     Social History Social History   Tobacco Use   Smoking status: Never   Smokeless tobacco: Never   Tobacco comments:    pt states she experimented in college  Vaping Use   Vaping status: Never Used  Substance Use Topics   Alcohol use: Yes    Alcohol/week: 2.0 standard drinks of alcohol    Types: 1 Glasses of wine, 1 Standard drinks or equivalent per week    Comment: once a wk. 1-2 glasses   Drug use: No   Allergies Ozempic (0.25 or 0.5 mg-dose) [semaglutide(0.25 or 0.5mg -dos)], Tape, Betadine [povidone iodine], Codeine, Garlic, Hydrocodone , Iodine, Macrobid [nitrofurantoin], Ofev  [nintedanib], Onion, Shellfish allergy, Statins, and Sulfa  antibiotics  Review of Systems A thorough review of systems was obtained and all systems are negative except as noted in the HPI and PMH.   Physical Exam Vital Signs  I have reviewed the triage vital signs BP 138/67 (BP Location: Right Arm)   Pulse 88   Temp 98.4 F (36.9 C) (Oral)   Resp 18   Ht 4' 11 (1.499 m)   Wt 67.1 kg   SpO2 98%   BMI 29.89 kg/m  Physical Exam Vitals and nursing note reviewed.  Constitutional:      General: She is not in acute distress.    Appearance: Normal appearance. She is well-developed. She is not ill-appearing.  HENT:     Head: Normocephalic and atraumatic.     Right Ear: External ear normal.     Left Ear: External ear normal.     Nose: Nose normal.      Mouth/Throat:     Mouth: Mucous membranes are moist.  Eyes:     General: No scleral icterus.       Right eye: No discharge.        Left eye: No discharge.  Cardiovascular:     Rate and Rhythm: Normal rate.  Pulses: Normal pulses.     Heart sounds: Normal heart sounds.  Pulmonary:     Effort: Pulmonary effort is normal. No respiratory distress.     Breath sounds: No stridor.  Abdominal:     General: Abdomen is flat. There is no distension.     Tenderness: There is no guarding.  Musculoskeletal:        General: No deformity.     Cervical back: No rigidity.  Skin:    General: Skin is warm and dry.     Coloration: Skin is not cyanotic, jaundiced or pale.  Neurological:     Mental Status: She is alert and oriented to person, place, and time.     GCS: GCS eye subscore is 4. GCS verbal subscore is 5. GCS motor subscore is 6.  Psychiatric:        Speech: Speech normal.        Behavior: Behavior normal. Behavior is cooperative.     ED Results and Treatments Labs (all labs ordered are listed, but only abnormal results are displayed) Labs Reviewed - No data to display                                                                                                                        Radiology CT Head Wo Contrast Result Date: 06/04/2024 CLINICAL DATA:  Clemens.  Hit head. EXAM: CT HEAD WITHOUT CONTRAST CT CERVICAL SPINE WITHOUT CONTRAST TECHNIQUE: Multidetector CT imaging of the head and cervical spine was performed following the standard protocol without intravenous contrast. Multiplanar CT image reconstructions of the cervical spine were also generated. RADIATION DOSE REDUCTION: This exam was performed according to the departmental dose-optimization program which includes automated exposure control, adjustment of the mA and/or kV according to patient size and/or use of iterative reconstruction technique. COMPARISON:  None Available. FINDINGS: CT HEAD FINDINGS Brain: Mild age related  cerebral atrophy, ventriculomegaly and periventricular white matter disease. No extra-axial fluid collections are identified. No CT findings for acute hemispheric infarction or intracranial hemorrhage. No mass lesions. The brainstem and cerebellum are normal. Vascular: No hyperdense vessel or unexpected calcification. Skull: No skull fracture bone lesion.  Moderate osteoporosis. Sinuses/Orbits: The paranasal sinuses and mastoid air cells are clear. Other: No scalp lesions or scalp hematoma. CT CERVICAL SPINE FINDINGS Alignment: Normal Skull base and vertebrae: No acute fracture. No primary bone lesion or focal pathologic process. Moderate to advanced osteoporosis. Soft tissues and spinal canal: No prevertebral fluid or swelling. No visible canal hematoma. Disc levels: The spinal canal is fairly generous. No significant canal stenosis. Uncinate spurring changes and facet disease contribute to mild multilevel foraminal stenosis. Upper chest: The lung apices demonstrate advanced lung disease but no apical lung lesion. Other: No neck mass, adenopathy or hematoma. IMPRESSION: 1. Mild age related cerebral atrophy, ventriculomegaly and periventricular white matter disease. 2. No acute intracranial findings or skull fracture. 3. Normal alignment of the cervical vertebral bodies and no acute fracture. 4. Moderate to advanced osteoporosis. Electronically  Signed   By: MYRTIS Stammer M.D.   On: 06/04/2024 14:51   CT Cervical Spine Wo Contrast Result Date: 06/04/2024 CLINICAL DATA:  Clemens.  Hit head. EXAM: CT HEAD WITHOUT CONTRAST CT CERVICAL SPINE WITHOUT CONTRAST TECHNIQUE: Multidetector CT imaging of the head and cervical spine was performed following the standard protocol without intravenous contrast. Multiplanar CT image reconstructions of the cervical spine were also generated. RADIATION DOSE REDUCTION: This exam was performed according to the departmental dose-optimization program which includes automated exposure  control, adjustment of the mA and/or kV according to patient size and/or use of iterative reconstruction technique. COMPARISON:  None Available. FINDINGS: CT HEAD FINDINGS Brain: Mild age related cerebral atrophy, ventriculomegaly and periventricular white matter disease. No extra-axial fluid collections are identified. No CT findings for acute hemispheric infarction or intracranial hemorrhage. No mass lesions. The brainstem and cerebellum are normal. Vascular: No hyperdense vessel or unexpected calcification. Skull: No skull fracture bone lesion.  Moderate osteoporosis. Sinuses/Orbits: The paranasal sinuses and mastoid air cells are clear. Other: No scalp lesions or scalp hematoma. CT CERVICAL SPINE FINDINGS Alignment: Normal Skull base and vertebrae: No acute fracture. No primary bone lesion or focal pathologic process. Moderate to advanced osteoporosis. Soft tissues and spinal canal: No prevertebral fluid or swelling. No visible canal hematoma. Disc levels: The spinal canal is fairly generous. No significant canal stenosis. Uncinate spurring changes and facet disease contribute to mild multilevel foraminal stenosis. Upper chest: The lung apices demonstrate advanced lung disease but no apical lung lesion. Other: No neck mass, adenopathy or hematoma. IMPRESSION: 1. Mild age related cerebral atrophy, ventriculomegaly and periventricular white matter disease. 2. No acute intracranial findings or skull fracture. 3. Normal alignment of the cervical vertebral bodies and no acute fracture. 4. Moderate to advanced osteoporosis. Electronically Signed   By: MYRTIS Stammer M.D.   On: 06/04/2024 14:51    Pertinent labs & imaging results that were available during my care of the patient were reviewed by me and considered in my medical decision making (see MDM for details).  Medications Ordered in ED Medications - No data to display                                                                                                                                    Procedures Procedures  (including critical care time)  Medical Decision Making / ED Course    Medical Decision Making:    Rhonda [name] is a 73 y.o. female with past medical history as below, significant for GERD, IBS, MVP, pulmonary fibrosis, anticoagulated who presents to the ED with complaint of fall. The complaint involves an extensive differential diagnosis and also carries with it a high risk of complications and morbidity.  Serious etiology was considered. Ddx includes but is not limited to: Differential diagnoses for head trauma includes subdural hematoma, epidural hematoma, acute concussion, traumatic subarachnoid hemorrhage, cerebral contusions, etc.   Complete initial  physical exam performed, notably the patient was in acute distress, sitting comfortably on stretcher.    Reviewed and confirmed nursing documentation for past medical history, family history, social history.  Vital signs reviewed.    Head injury FOT > -minor head injury from [facility] -exam is stable, no external evidence of head trauma -feels back to baseline -CTH/CTCS neg   Patient in no distress and overall condition is stable. Detailed discussions were had with the patient/guardian regarding current findings, and need for close f/u with PCP or on call doctor. The patient/guardian has been instructed to return immediately if the symptoms worsen in any way for re-evaluation. Patient/guardian verbalized understanding and is in agreement with current care plan. All questions answered prior to discharge.                    Additional history obtained: -Additional history obtained from family -External records from outside source obtained and reviewed including: Chart review including previous notes, labs, imaging, consultation notes including  Home meds Prior admit last month   Lab Tests: na  EKG   EKG Interpretation Date/Time:  Sunday June 04 2024  13:17:49 EDT Ventricular Rate:  87 PR Interval:  100 QRS Duration:  101 QT Interval:  460 QTC Calculation: 554 R Axis:   8  Text Interpretation: Sinus rhythm Short PR interval Borderline repolarization abnormality Prolonged QT interval similar to prior no stemi Confirmed by Elnor Savant ([id number]) on 06/04/2024 2:02:05 PM         Imaging Studies ordered: I ordered imaging studies including CTH CT cervical I independently visualized the following imaging with scope of interpretation limited to determining acute life threatening conditions related to emergency care; findings noted above I agree with the radiologist interpretation If any imaging was obtained with contrast I closely monitored patient for any possible adverse reaction a/w contrast administration in the emergency department   Medicines ordered and prescription drug management: No orders of the defined types were placed in this encounter.   -I have reviewed the patients home medicines and have made adjustments as needed   Consultations Obtained: na   Cardiac Monitoring: Continuous pulse oximetry interpreted by myself, 100% on ra.    Social Determinants of Health:  Diagnosis or treatment significantly limited by social determinants of health: na   Reevaluation: After the interventions noted above, I reevaluated the patient and found that they have resolved  Co morbidities that complicate the patient evaluation  Past Medical History:  Diagnosis Date   Allergy    Arthritis    history spinal stenosis. osteoarthritis right hip   Asthma    Cataract    Coronary artery calcification seen on CAT scan 08/19/2017   >300 on CT scan 08/2017   DDD (degenerative disc disease), thoracic    Depression    DOE (dyspnea on exertion)    a. 04/2010 Lexi MV EF 71%, no ischemia/infarct;     Fatty liver    GERD (gastroesophageal reflux disease)    H/O steroid therapy    Steroid use orally over 4 yrs- for Lung Fibrosis   Heart  palpitations 02/28/2015   Helicobacter pylori ab+    Hemorrhoids    Hiatal hernia    High cholesterol    History of chronic bronchitis    as child   History of migraine    History of MRSA infection    Hyperlipidemia, mixed 08/19/2017   Hyperplastic colon polyp 2007   IBS (irritable bowel syndrome)    Inguinal  hernia    right   Insulin  resistance    past   Interstitial lung disease (HCC)    MVP (mitral valve prolapse)    Posterior mitral valve leaflet with mild MR   Pneumonia    Pneumonitis, hypersensitivity (HCC)    a. 09/2012 s/p Bx - ? 2/2 bird, mold, oil paint exposure ->on steroids, followed by pulm.   PONV (postoperative nausea and vomiting)    Pre-diabetes    takes Metformin    Pulmonary fibrosis (HCC)    Dr. Geronimo follows- stable at present   PVC (premature ventricular contraction) 08/19/2017   Rapid heart rate    Dr. Shlomo follows- last visit Epic note 9'16   Squamous cell carcinoma of skin    Tinnitus, right ear    Vocal cord ulcer       Dispostion: Disposition decision including need for hospitalization was considered, and patient discharged from emergency department.    Final Clinical Impression(s) / ED Diagnoses Final diagnoses:  Minor head injury, initial encounter        Elnor Jayson LABOR, DO 06/04/24 1531

## 2024-06-05 ENCOUNTER — Ambulatory Visit: Payer: Self-pay

## 2024-06-05 ENCOUNTER — Telehealth: Payer: Self-pay | Admitting: Internal Medicine

## 2024-06-05 LAB — LAB REPORT - SCANNED: EGFR: 53

## 2024-06-05 NOTE — Telephone Encounter (Signed)
 Incoming call from patient regarding an active bleed. PT had a colonoscopy on 7/6 and is calling today because she said she is about to go to ED about the bleed. She is very concerned and would like to know what is Dr. Pamula plan from here. Please advise.

## 2024-06-05 NOTE — Telephone Encounter (Signed)
 Patient has appt 7/23. No sooner availability.

## 2024-06-05 NOTE — Telephone Encounter (Signed)
 FYI Only or Action Required?: Action required by provider: request for appointment.  Patient is followed in Pulmonology for ILD, last seen on 04/13/2024 by Geronimo Amel, MD.  Called Nurse Triage reporting Bleeding/Bruising.  Symptoms began several days ago.  Interventions attempted: Other: Stopped eliquis .  Symptoms are: unchanged.  Triage Disposition: Call Specialist Today  Patient/caregiver understands and will follow disposition?: Yes    Copied from CRM [id number]. Topic: Clinical - Red Word Triage >> Jun 05, 2024  2:30 PM Celestine FALCON wrote: Red Word that prompted transfer to Nurse Triage: Pt recently had a bleeding episode last night and was put on eliquis . Pt is seeing providers to get the bleed under control. Pt stated she recently hit her head yesterday, and was told that pulmonary would need to be involved to assist. Pt sees Dr. Geronimo in [place]. Reason for Disposition  MILD rectal bleeding (e.g., more than just a few drops or streaks)  Answer Assessment - Initial Assessment Questions 1. APPEARANCE of BLOOD: What color is it? Is it passed separately, on the surface of the stool, or mixed in with the stool?      Bright red blood only with BM Pt saw PCP today and instructed to stop on Eliquis  s/p colonoscopy last week. 2. AMOUNT: How much blood was passed?      Bright red 3. FREQUENCY: How many times has blood been passed with the stools?      Yesterday about 3 times, today was scant compared to yesterday - blood when wiping 4. ONSET: When was the blood first seen in the stools? (Days or weeks)      yesterday 5. DIARRHEA: Is there also some diarrhea? If Yes, ask: How many diarrhea stools in the past 24 hours?      denies 6. CONSTIPATION: Do you have constipation? If Yes, ask: How bad is it?     denies 7. RECURRENT SYMPTOMS: Have you had blood in your stools before? If Yes, ask: When was the last time? and What happened that time?       denies 8. BLOOD THINNERS: Do you take any blood thinners? (e.g., aspirin , clopidogrel / Plavix, coumadin, heparin ). Notes: Other strong blood thinners include: Arixtra (fondaparinux), Eliquis  (apixaban ), Pradaxa (dabigatran), and Xarelto (rivaroxaban).     Eliquis  - went to PCP and was advised to stop today 9. OTHER SYMPTOMS: Do you have any other symptoms?  (e.g., abdomen pain, vomiting, dizziness, fever)     Dizziness is a huge problem - hit head last night and went to ED with head imaging - everything was fine 10. PREGNANCY: Is there any chance you are pregnant? When was your last menstrual period?       N/a    Triager attempted to schedule with [facility] but no access. Triager called [facility] to notify that pt has been in communication with Dr. JONELLE and was advised to see APP. Triager will forward encounter for Dr. Geronimo 's office to review and advise. Patient verbalized understanding and is expecting call back from office for next steps/schedule appt.  Protocols used: Rectal Bleeding-A-AH

## 2024-06-05 NOTE — Telephone Encounter (Signed)
 Attempted to call pt but received message that voicemail is full and is not accepting messages at this time. Will try again.

## 2024-06-06 ENCOUNTER — Encounter (HOSPITAL_COMMUNITY)

## 2024-06-06 ENCOUNTER — Ambulatory Visit

## 2024-06-06 ENCOUNTER — Other Ambulatory Visit: Payer: Self-pay | Admitting: Primary Care

## 2024-06-06 ENCOUNTER — Telehealth: Payer: Self-pay

## 2024-06-06 VITALS — BP 126/60 | HR 85 | Temp 98.5°F | Ht 59.0 in | Wt 141.0 lb

## 2024-06-06 DIAGNOSIS — K625 Hemorrhage of anus and rectum: Secondary | ICD-10-CM

## 2024-06-06 DIAGNOSIS — Z79899 Other long term (current) drug therapy: Secondary | ICD-10-CM

## 2024-06-06 DIAGNOSIS — J849 Interstitial pulmonary disease, unspecified: Secondary | ICD-10-CM | POA: Diagnosis not present

## 2024-06-06 DIAGNOSIS — I2699 Other pulmonary embolism without acute cor pulmonale: Secondary | ICD-10-CM

## 2024-06-06 LAB — BASIC METABOLIC PANEL WITH GFR
BUN: 37 mg/dL — ABNORMAL HIGH (ref 6–23)
CO2: 28 meq/L (ref 19–32)
Calcium: 9.6 mg/dL (ref 8.4–10.5)
Chloride: 101 meq/L (ref 96–112)
Creatinine, Ser: 0.97 mg/dL (ref 0.40–1.20)
GFR: 57.26 mL/min — ABNORMAL LOW (ref >60.00)
Glucose, Bld: 189 mg/dL — ABNORMAL HIGH (ref 70–99)
Potassium: 5.3 meq/L — ABNORMAL HIGH (ref 3.5–5.1)
Sodium: 134 meq/L — ABNORMAL LOW (ref 135–145)

## 2024-06-06 NOTE — Patient Instructions (Signed)
 Orthostatic vital signs in office today did not demonstrate a drop in blood pressure.  Check CBC and BMP;  follow up in MyChart regarding resuming Eliquis  therapy.  Keep follow up as scheduled with Dr. Geronimo on 7/23.  Keep GI follow up as scheduled with GI on 7/18.  If worsening shortness of breath, chest pain, dizziness, lightheadedness, change in bleeding- go to the ED.

## 2024-06-06 NOTE — Telephone Encounter (Signed)
 ATC X1. LMTCB.   Pt was seen today by Candis Dandy, PA and was suppose to have a CBC drawn which was not. Pt needs to come back possibly today or tomorrow to have this drawn.

## 2024-06-06 NOTE — Progress Notes (Signed)
 @Patient  ID: Rhonda [name], female    DOB: [birth year], 73 y.o.   MRN: [id number]  Chief Complaint  Patient presents with   Follow-up    SOB with exertion.  Rectal bleeding x 2 days.  Dr. Burney d/c'd Eloquis.    Referring provider: Geronimo Amel, MD  HPI: Rhonda [name] is a 73 year old female with past medical history of interstitial lung disease, chronic respiratory failure with hypoxia on home oxygen , and recent diagnosis of pulmonary embolisms on Eliquis  therapy who presents today for follow-up.  Briefly, she was diagnosed with bilateral small pulmonary embolisms about 2 weeks ago and started on oral Eliquis ; subsequently, she developed bright red blood per rectum requiring admission and GI was consulted.  This was in the setting of recent colonoscopy with resection of 10 colonic polyps, internal hemorrhoids and anal fissure noted.  Eliquis  was placed on hold at that time.  She remained hemodynamically stable and did not drop her hemoglobin therefore Eliquis  was resumed.  Since then she has had 2 subsequent falls associated with lightheadedness.  Worked up in the ED on 06/04/2024 and was negative for any intracranial hemorrhage or subdural hematomas.  She has continued to have some bright red blood per rectum but it is only with bowel movements.  She is not having any tenesmus or diarrhea.  She also denies any melena.  She is following with both her primary care provider and with GI regarding this.  She was seen as recently as yesterday 06/05/2024 by her PCP.  At that time Eliquis  was held due to continued concern for bleeding.  Today she presents for follow-up with complaints of shortness of breath above her baseline.  She continues to have some dizziness and lightheadedness with standing.  Orthostatics completed in office today do not demonstrate a drop in her blood pressure; see vitals below.  She denies any chest pain, nausea, vomiting, abdominal pain, calf pain, fever, chills, or  other complaints.  She does report that she has had to use her oxygen  more than usual.  TEST/EVENTS :   Allergies  Allergen Reactions   Ozempic (0.25 Or 0.5 Mg-Dose) [Semaglutide(0.25 Or 0.5mg -Dos)] Other (See Comments)    Paralyzed the stomach   Tape Other (See Comments)    SKIN IS VERY FRAGILE!! Burns and pulls up the skin!! SKIN BRUISES and TEARS EASILY.SABRA   Betadine [Povidone Iodine] Other (See Comments)    Blisters    Codeine Nausea And Vomiting   Garlic Diarrhea   Hydrocodone  Nausea And Vomiting   Iodine Other (See Comments)    Blisters    Macrobid [Nitrofurantoin] Other (See Comments)    Instructed by dr not to take    Ofev  [Nintedanib] Other (See Comments)    Abdominal pain   Onion Diarrhea   Shellfish Allergy Nausea And Vomiting   Statins Other (See Comments)    Leg pain, but Pravachol  is tolerated   Sulfa  Antibiotics Other (See Comments)    Headaches     Immunization History  Administered Date(s) Administered   Fluad Quad(high Dose 65+) 07/17/2019   Hepatitis A 05/23/2010   Hepatitis B 06/23/2006   Influenza Split 08/11/2012, 08/14/2017   Influenza Whole 09/08/2011   Influenza, High Dose Seasonal PF 08/24/2016, 07/24/2017, 08/31/2018, 08/15/2020, 09/26/2020, 09/12/2021, 08/24/2022   Influenza,inj,Quad PF,6+ Mos 09/07/2013   Influenza-Unspecified 09/26/2014, 07/27/2015, 08/24/2023   MMR 05/23/2010   Moderna Sars-Covid-2 Vaccination 05/07/2021, 07/30/2021, 01/01/2022   PFIZER(Purple Top)SARS-COV-2 Vaccination 12/16/2019, 01/08/2020, 07/18/2020, 08/11/2022   Pfizer(Comirnaty)Fall Seasonal Vaccine 12  years and older 07/24/2023   Pneumococcal Conjugate-13 03/15/2014   Pneumococcal Polysaccharide-23 01/11/2018, 09/26/2020, 08/26/2021   Respiratory Syncytial Virus Vaccine,Recomb Aduvanted(Arexvy) 07/28/2022   Td 02/22/2003   Tdap 10/28/2012   Zoster Recombinant(Shingrix) 02/15/2017, 05/17/2017   Zoster, Live 12/04/2010    Past Medical History:   Diagnosis Date   Allergy    Arthritis    history spinal stenosis. osteoarthritis right hip   Asthma    Cataract    Coronary artery calcification seen on CAT scan 08/19/2017   >300 on CT scan 08/2017   DDD (degenerative disc disease), thoracic    Depression    DOE (dyspnea on exertion)    a. 04/2010 Lexi MV EF 71%, no ischemia/infarct;     Fatty liver    GERD (gastroesophageal reflux disease)    H/O steroid therapy    Steroid use orally over 4 yrs- for Lung Fibrosis   Heart palpitations 02/28/2015   Helicobacter pylori ab+    Hemorrhoids    Hiatal hernia    High cholesterol    History of chronic bronchitis    as child   History of migraine    History of MRSA infection    Hyperlipidemia, mixed 08/19/2017   Hyperplastic colon polyp 2007   IBS (irritable bowel syndrome)    Inguinal hernia    right   Insulin  resistance    past   Interstitial lung disease (HCC)    MVP (mitral valve prolapse)    Posterior mitral valve leaflet with mild MR   Pneumonia    Pneumonitis, hypersensitivity (HCC)    a. 09/2012 s/p Bx - ? 2/2 bird, mold, oil paint exposure ->on steroids, followed by pulm.   PONV (postoperative nausea and vomiting)    Pre-diabetes    takes Metformin    Pulmonary fibrosis (HCC)    Dr. Geronimo follows- stable at present   PVC (premature ventricular contraction) 08/19/2017   Rapid heart rate    Dr. Shlomo follows- last visit Epic note 9'16   Squamous cell carcinoma of skin    Tinnitus, right ear    Vocal cord ulcer     Tobacco History: Social History   Tobacco Use  Smoking Status Never  Smokeless Tobacco Never  Tobacco Comments   pt states she experimented in college   Counseling given: Not Answered Tobacco comments: pt states she experimented in college   Outpatient Medications Prior to Visit  Medication Sig Dispense Refill   albuterol  (PROAIR  HFA) 108 (90 Base) MCG/ACT inhaler Inhale 2 puffs into the lungs every 4 (four) hours as needed for wheezing.  Or coughing spells.  You may use 2 Puffs 5-10 minutes before exercise. (Patient taking differently: Inhale 2 puffs into the lungs every 4 (four) hours as needed for wheezing (or coughing. May also use 2 puffs 5-10 minutes before exercise.).) 1 each 3   albuterol  (PROVENTIL ) (2.5 MG/3ML) 0.083% nebulizer solution Take 3 mLs (2.5 mg total) by nebulization every 6 (six) hours as needed for wheezing or shortness of breath. 360 mL 0   Alpha-D-Galactosidase (BEANO PO) Take 1 tablet by mouth as needed (as directed, if eating onion or garlic).     ALPRAZolam  (XANAX ) 0.25 MG tablet Take 0.25 mg by mouth at bedtime as needed for anxiety or sleep.     AMBULATORY NON FORMULARY MEDICATION See admin instructions. Allergy injections on Mondays and hold when sick     arformoterol  (BROVANA ) 15 MCG/2ML NEBU Take 2 mLs (15 mcg total) by nebulization 2 (two) times daily.  120 mL 0   ascorbic acid (VITAMIN C) 500 MG tablet Take 500 mg by mouth daily.     ASHWAGANDHA PO Take 1 capsule by mouth every other day.     azelastine (ASTELIN) 0.1 % nasal spray Place 1 spray into both nostrils in the morning. Use in each nostril as directed     budesonide  (PULMICORT ) 0.5 MG/2ML nebulizer solution Take 2 mLs (0.5 mg total) by nebulization in the morning and at bedtime. 120 mL 0   Calcium  Carb-Cholecalciferol (CALCIUM  500 + D PO) Take 1 tablet by mouth daily.     chlorpheniramine (CHLOR-TRIMETON) 4 MG tablet Take 2 mg by mouth 2 (two) times daily as needed for allergies or rhinitis.     cholecalciferol (VITAMIN D3) 25 MCG (1000 UNIT) tablet Take 1,000 Units by mouth every other day.     Continuous Glucose Sensor (DEXCOM G7 SENSOR) MISC Check blood sugar fasting and before meals and at bedtime. 4 each 0   dicyclomine  (BENTYL ) 10 MG capsule Take 10 mg by mouth 2 (two) times daily as needed for spasms.     diltiazem  (CARDIZEM  CD) 120 MG 24 hr capsule Take 1 capsule (120 mg total) by mouth daily. 90 capsule 3   EPINEPHrine  0.3 mg/0.3 mL  IJ SOAJ injection Inject 0.3 mg into the muscle as needed for anaphylaxis.     ezetimibe  (ZETIA ) 10 MG tablet Take 1 tablet (10 mg total) by mouth daily. (Patient taking differently: Take 10 mg by mouth at bedtime.) 90 tablet 3   fluticasone  (FLONASE ) 50 MCG/ACT nasal spray Spray 2 sprays into each nostril every day. (Patient taking differently: Place 2 sprays into both nostrils in the morning.) 48 g 12   HUMALOG  KWIKPEN 100 UNIT/ML KwikPen Inject 8-10 Units into the skin See admin instructions. Inject 10 units into the skin before breakfast & lunch and 8 before supper     hydrocortisone  (ANUSOL -HC) 25 MG suppository Place 1 suppository (25 mg total) rectally at bedtime for 12 days. 12 suppository 0   Hypromellose (NATURES TEARS OP) Place 1 drop into both eyes in the morning and at bedtime.     Insulin  Disposable Pump (OMNIPOD 5 G7 PODS, GEN 5,) MISC Inject 1 Device into the skin every 3 (three) days.     levocetirizine (XYZAL ) 5 MG tablet Take 5 mg by mouth at bedtime.     metFORMIN  (GLUCOPHAGE -XR) 500 MG 24 hr tablet Take 500 mg by mouth daily with breakfast.     metoprolol  succinate (TOPROL -XL) 25 MG 24 hr tablet Take 1 tablet (25 mg total) by mouth daily. 90 tablet 3   montelukast  (SINGULAIR ) 10 MG tablet TAKE 1 TABLET BY MOUTH EVERYDAY AT BEDTIME (Patient taking differently: Take 10 mg by mouth at bedtime.) 90 tablet 2   Multiple Vitamin (MULTIVITAMIN) tablet Take 1 tablet by mouth daily with breakfast.     mycophenolate  (CELLCEPT ) 500 MG tablet Take 2 tablets (1,000 mg total) by mouth 2 (two) times daily. 120 tablet 0   nitroGLYCERIN  (NITROGLYN) 2 % ointment Apply 0.25 inches topically 2 (two) times daily. Apply To anus and surrounding tissue. Use pea size amount 30 g 0   nitroGLYCERIN  (NITROSTAT ) 0.4 MG SL tablet Place 1 tablet (0.4 mg total) under the tongue every 5 (five) minutes as needed for chest pain. 75 tablet 3   nystatin  cream (MYCOSTATIN ) Apply 1 Application topically 2 (two) times  daily as needed (for irritation- affected sites).     omeprazole  (PRILOSEC  OTC) 20 MG  tablet Take 10 mg by mouth See admin instructions. Take 10 mg by mouth at 4:30 AM     OXYGEN  Inhale 4-5 L/min into the lungs See admin instructions. Inhale 4-5 L/min at bedtime and as needed for shortness of breath during the day     pravastatin  (PRAVACHOL ) 40 MG tablet Take 1 tablet (40 mg total) by mouth every evening. (Patient taking differently: Take 40 mg by mouth at bedtime.) 90 tablet 1   predniSONE  (DELTASONE ) 20 MG tablet Take 3 tablets (60 mg total) by mouth daily with breakfast. 90 tablet 0   PREPARATION H 0.25-50 % GEL Place 1 application  rectally 2 (two) times daily as needed (for hemorrhoids).     revefenacin  (YUPELRI ) 175 MCG/3ML nebulizer solution Inhale 1 vial by nebulization daily. 90 mL 0   sodium chloride  (OCEAN) 0.65 % SOLN nasal spray Place 1 spray into both nostrils as needed for congestion.     Sodium Chloride , Inhalant, 7 % NEBU Use per nebulizer once a day to help clear sputum 120 mL 3   sulfamethoxazole -trimethoprim  (BACTRIM  DS) 800-160 MG tablet Take 1 tablet by mouth every Monday, Wednesday, and Friday.     apixaban  (ELIQUIS ) 5 MG TABS tablet Take 1 tablet (5 mg total) by mouth 2 (two) times daily. (Patient not taking: Reported on 06/06/2024) 60 tablet 0   No facility-administered medications prior to visit.     Review of Systems:   Constitutional:   No  weight loss, night sweats,  Fevers, chills, fatigue, or  lassitude.  HEENT:   No headaches,  Difficulty swallowing,  Tooth/dental problems, or  Sore throat,                No sneezing, itching, ear ache, nasal congestion, post nasal drip,   CV:  No chest pain,  Orthopnea, PND, swelling in lower extremities, anasarca, dizziness, palpitations, syncope.   GI  No heartburn, indigestion, abdominal pain, nausea, vomiting, diarrhea, change in bowel habits, loss of appetite.  Reports bright red blood per rectum with bowel movements  only.  This has improved; she now has some small clots.  denies melena  Resp: Positive for shortness of breath with exertion or at rest.  No excess mucus, no productive cough,  No non-productive cough,  No coughing up of blood.  No change in color of mucus.  No wheezing.  No chest wall deformity  Skin: no rash or lesions.  GU: no dysuria, change in color of urine, no urgency or frequency.  No flank pain, no hematuria   MS:  No joint pain or swelling.  No decreased range of motion.  No back pain.    Physical Exam  BP 126/60 (BP Location: Left Arm, Patient Position: Sitting, Cuff Size: Normal)   Pulse 85   Temp 98.5 F (36.9 C) (Oral)   Ht 4' 11 (1.499 m)   Wt 141 lb (64 kg)   SpO2 95% Comment: 3L POC pulsed oxygen   BMI 28.48 kg/m   Orthostatic vitals: BP lying 124/66; sitting 116/64; standing 124/56  GEN: A/Ox3; pleasant ,  well nourished; appears to give mildly more short of breath with talking throughout the course of the interview   HEENT:  [place]/AT,  EACs-clear, TMs-wnl, NOSE-clear, THROAT-clear, no lesions, no postnasal drip or exudate noted.   NECK:  Supple w/ fair ROM; no JVD; normal carotid impulses w/o bruits; no thyromegaly or nodules palpated; no lymphadenopathy.    RESP  Clear  P & A; w/o, wheezes/ rales/ or  rhonchi. no accessory muscle use, no dullness to percussion  CARD:  RRR, no m/r/g, no peripheral edema, pulses intact, no cyanosis or clubbing.  GI:   Soft & nt; nml bowel sounds; no organomegaly or masses detected.   Musco: Warm bil, no deformities or joint swelling noted.   Neuro: alert, no focal deficits noted.    Skin: Warm, no lesions or rashes    Lab Results:  CBC    Component Value Date/Time   WBC 18.3 (H) 05/30/2024 0823   RBC 4.44 05/30/2024 0823   HGB 12.6 05/30/2024 0823   HCT 39.9 05/30/2024 0823   PLT 168 05/30/2024 0823   MCV 89.9 05/30/2024 0823   MCH 28.4 05/30/2024 0823   MCHC 31.6 05/30/2024 0823   RDW 17.7 (H) 05/30/2024 0823    LYMPHSABS 0.6 (L) 05/30/2024 0823   MONOABS 1.5 (H) 05/30/2024 0823   EOSABS 0.0 05/30/2024 0823   BASOSABS 0.0 05/30/2024 0823    BMET    Component Value Date/Time   NA 136 05/30/2024 0823   NA 142 12/08/2018 0917   K 5.5 (H) 05/30/2024 0823   CL 98 05/30/2024 0823   CO2 28 05/30/2024 0823   GLUCOSE 203 (H) 05/30/2024 0823   BUN 29 (H) 05/30/2024 0823   BUN 18 12/08/2018 0917   CREATININE 0.74 05/30/2024 0823   CREATININE 1.12 (H) 05/04/2024 1315   CALCIUM  9.0 05/30/2024 0823   GFRNONAA >60 05/30/2024 0823   GFRNONAA 66 02/26/2015 1540   GFRAA >60 10/04/2019 0216   GFRAA 77 02/26/2015 1540    BNP    Component Value Date/Time   BNP 89.7 05/22/2024 1302    ProBNP    Component Value Date/Time   PROBNP 70.0 03/06/2024 0934    Imaging: CT Head Wo Contrast Result Date: 06/04/2024 CLINICAL DATA:  [place]lem[place].  Hit head. EXAM: CT HEAD WITHOUT CONTRAST CT CERVICAL SPINE WITHOUT CONTRAST TECHNIQUE: Multidetector CT imaging of the head and cervical spine was performed following the standard protocol without intravenous contrast. Multiplanar CT image reconstructions of the cervical spine were also generated. RADIATION DOSE REDUCTION: This exam was performed according to the departmental dose-optimization program which includes automated exposure control, adjustment of the mA and/or kV according to patient size and/or use of iterative reconstruction technique. COMPARISON:  None Available. FINDINGS: CT HEAD FINDINGS Brain: Mild age related cerebral atrophy, ventriculomegaly and periventricular white matter disease. No extra-axial fluid collections are identified. No CT findings for acute hemispheric infarction or intracranial hemorrhage. No mass lesions. The brainstem and cerebellum are normal. Vascular: No hyperdense vessel or unexpected calcification. Skull: No skull fracture bone lesion.  Moderate osteoporosis. Sinuses/Orbits: The paranasal sinuses and mastoid air cells are clear. Other: No  scalp lesions or scalp hematoma. CT CERVICAL SPINE FINDINGS Alignment: Normal Skull base and vertebrae: No acute fracture. No primary bone lesion or focal pathologic process. Moderate to advanced osteoporosis. Soft tissues and spinal canal: No prevertebral fluid or swelling. No visible canal hematoma. Disc levels: The spinal canal is fairly generous. No significant canal stenosis. Uncinate spurring changes and facet disease contribute to mild multilevel foraminal stenosis. Upper chest: The lung apices demonstrate advanced lung disease but no apical lung lesion. Other: No neck mass, adenopathy or hematoma. IMPRESSION: 1. Mild age related cerebral atrophy, ventriculomegaly and periventricular white matter disease. 2. No acute intracranial findings or skull fracture. 3. Normal alignment of the cervical vertebral bodies and no acute fracture. 4. Moderate to advanced osteoporosis. Electronically Signed   By: P.  Gallerani  M.D.   On: 06/04/2024 14:51   CT Cervical Spine Wo Contrast Result Date: 06/04/2024 CLINICAL DATA:  [place].  Hit head. EXAM: CT HEAD WITHOUT CONTRAST CT CERVICAL SPINE WITHOUT CONTRAST TECHNIQUE: Multidetector CT imaging of the head and cervical spine was performed following the standard protocol without intravenous contrast. Multiplanar CT image reconstructions of the cervical spine were also generated. RADIATION DOSE REDUCTION: This exam was performed according to the departmental dose-optimization program which includes automated exposure control, adjustment of the mA and/or kV according to patient size and/or use of iterative reconstruction technique. COMPARISON:  None Available. FINDINGS: CT HEAD FINDINGS Brain: Mild age related cerebral atrophy, ventriculomegaly and periventricular white matter disease. No extra-axial fluid collections are identified. No CT findings for acute hemispheric infarction or intracranial hemorrhage. No mass lesions. The brainstem and cerebellum are normal. Vascular: No  hyperdense vessel or unexpected calcification. Skull: No skull fracture bone lesion.  Moderate osteoporosis. Sinuses/Orbits: The paranasal sinuses and mastoid air cells are clear. Other: No scalp lesions or scalp hematoma. CT CERVICAL SPINE FINDINGS Alignment: Normal Skull base and vertebrae: No acute fracture. No primary bone lesion or focal pathologic process. Moderate to advanced osteoporosis. Soft tissues and spinal canal: No prevertebral fluid or swelling. No visible canal hematoma. Disc levels: The spinal canal is fairly generous. No significant canal stenosis. Uncinate spurring changes and facet disease contribute to mild multilevel foraminal stenosis. Upper chest: The lung apices demonstrate advanced lung disease but no apical lung lesion. Other: No neck mass, adenopathy or hematoma. IMPRESSION: 1. Mild age related cerebral atrophy, ventriculomegaly and periventricular white matter disease. 2. No acute intracranial findings or skull fracture. 3. Normal alignment of the cervical vertebral bodies and no acute fracture. 4. Moderate to advanced osteoporosis. Electronically Signed   By: MYRTIS Stammer M.D.   On: 06/04/2024 14:51   VAS US  LOWER EXTREMITY VENOUS (DVT) Result Date: 05/24/2024  Lower Venous DVT Study Patient Name:  CORONA POPOVICH [place]  Date of Exam:   05/24/2024 Medical Rec #: [id number]          Accession #:    [id number] Date of Birth: 01-08-1951          Patient Gender: F Patient Age:   64 years Exam Location:  [facility] Procedure:      VAS US  LOWER EXTREMITY VENOUS (DVT) Referring Phys: LONNA CODER --------------------------------------------------------------------------------  Indications: Pulmonary embolism.  Comparison Study: No previous exams Performing Technologist: Jody Hill RVT, RDMS  Examination Guidelines: A complete evaluation includes B-mode imaging, spectral Doppler, color Doppler, and power Doppler as needed of all accessible portions of each vessel. Bilateral testing is  considered an integral part of a complete examination. Limited examinations for reoccurring indications may be performed as noted. The reflux portion of the exam is performed with the patient in reverse Trendelenburg.  +---------+---------------+---------+-----------+----------+--------------+ RIGHT    CompressibilityPhasicitySpontaneityPropertiesThrombus Aging +---------+---------------+---------+-----------+----------+--------------+ CFV      Full           Yes      Yes                                 +---------+---------------+---------+-----------+----------+--------------+ SFJ      Full                                                        +---------+---------------+---------+-----------+----------+--------------+  FV Prox  Full           Yes      Yes                                 +---------+---------------+---------+-----------+----------+--------------+ FV Mid   Full           Yes      Yes                                 +---------+---------------+---------+-----------+----------+--------------+ FV DistalFull           Yes      Yes                                 +---------+---------------+---------+-----------+----------+--------------+ PFV      Full                                                        +---------+---------------+---------+-----------+----------+--------------+ POP      Full           Yes      Yes                                 +---------+---------------+---------+-----------+----------+--------------+ PTV      Full                                                        +---------+---------------+---------+-----------+----------+--------------+ PERO     Full                                                        +---------+---------------+---------+-----------+----------+--------------+   +---------+---------------+---------+-----------+----------+--------------+ LEFT      CompressibilityPhasicitySpontaneityPropertiesThrombus Aging +---------+---------------+---------+-----------+----------+--------------+ CFV      Full           Yes      Yes                                 +---------+---------------+---------+-----------+----------+--------------+ SFJ      Full                                                        +---------+---------------+---------+-----------+----------+--------------+ FV Prox  Full           Yes      Yes                                 +---------+---------------+---------+-----------+----------+--------------+ FV Mid   Full  Yes      Yes                                 +---------+---------------+---------+-----------+----------+--------------+ FV DistalFull           Yes      Yes                                 +---------+---------------+---------+-----------+----------+--------------+ PFV      Full                                                        +---------+---------------+---------+-----------+----------+--------------+ POP      Full           Yes      Yes                                 +---------+---------------+---------+-----------+----------+--------------+ PTV      Full                                                        +---------+---------------+---------+-----------+----------+--------------+ PERO     Full                                                        +---------+---------------+---------+-----------+----------+--------------+     Summary: BILATERAL: - No evidence of deep vein thrombosis seen in the lower extremities, bilaterally. - RIGHT: - A cystic structure is found in the popliteal fossa (1.76 x 0.752 x 1.61 cm).  LEFT: - No cystic structure found in the popliteal fossa.  *See table(s) above for measurements and observations. Electronically signed by Gaile New MD on 05/24/2024 at 9:56:33 PM.    Final    CT CHEST ABDOMEN PELVIS W CONTRAST Result  Date: 05/24/2024 CLINICAL DATA:  Lymphadenopathy, unprovoked pulmonary embolism, concern for occult malignancy * Tracking Code: BO * EXAM: CT CHEST, ABDOMEN, AND PELVIS WITH CONTRAST TECHNIQUE: Multidetector CT imaging of the chest, abdomen and pelvis was performed following the standard protocol during bolus administration of intravenous contrast. RADIATION DOSE REDUCTION: This exam was performed according to the departmental dose-optimization program which includes automated exposure control, adjustment of the mA and/or kV according to patient size and/or use of iterative reconstruction technique. CONTRAST:  OMNIPAQUE  IOHEXOL  300 MG/ML SOLN additional oral enteric contrast COMPARISON:  CT chest angiogram, 05/22/2024 FINDINGS: CT CHEST FINDINGS Cardiovascular: Aortic atherosclerosis. Mild cardiomegaly. Three-vessel coronary artery calcifications no pericardial effusion. Mediastinum/Nodes: No enlarged mediastinal, hilar, or axillary lymph nodes. Small hiatal hernia. Thyroid  gland, trachea, and esophagus demonstrate no significant findings. Lungs/Pleura: Moderate pulmonary fibrosis in a heterogeneously distributed pattern, prominent areas of bronchiectasis peribronchovascular interstitial opacity, bronchiolectasis, and architectural distortion throughout the lungs, particularly in the right upper lobe and left lung base. Trace pleural effusions. Musculoskeletal: No chest wall abnormality. No acute osseous  findings. CT ABDOMEN PELVIS FINDINGS Hepatobiliary: No solid liver abnormality is seen. No gallstones, gallbladder wall thickening, or biliary dilatation. Pancreas: Unremarkable. No pancreatic ductal dilatation or surrounding inflammatory changes. Spleen: Normal in size without significant abnormality. Adrenals/Urinary Tract: Adrenal glands are unremarkable. Kidneys are normal, without renal calculi, solid lesion, or hydronephrosis. Bladder is unremarkable. Stomach/Bowel: Stomach is within normal limits.  Appendix appears normal. No evidence of bowel wall thickening, distention, or inflammatory changes. Vascular/Lymphatic: Aortic atherosclerosis. No enlarged abdominal or pelvic lymph nodes. Reproductive: No mass or other abnormality. Other: No abdominal wall hernia or abnormality. No ascites. Musculoskeletal: No acute osseous findings. Status post bilateral hip total arthroplasty. IMPRESSION: 1. No evidence of lymphadenopathy or metastatic disease in the chest, abdomen, or pelvis. 2. Moderate pulmonary fibrosis, unchanged, in an alternative diagnosis pattern by ATS pulmonary fibrosis criteria, imaging appearance again favoring chronic hypersensitivity pneumonitis. 3. Trace pleural effusions. 4. Mild cardiomegaly and coronary artery disease. Aortic Atherosclerosis (ICD10-I70.0). Electronically Signed   By: Marolyn JONETTA Jaksch M.D.   On: 05/24/2024 17:05   CT Angio Chest PE W/Cm &/Or Wo Cm Result Date: 05/22/2024 CLINICAL DATA:  Positive D-dimer, hypoxia, labored breathing, history of interstitial lung disease EXAM: CT ANGIOGRAPHY CHEST WITH CONTRAST TECHNIQUE: Multidetector CT imaging of the chest was performed using the standard protocol during bolus administration of intravenous contrast. Multiplanar CT image reconstructions and MIPs were obtained to evaluate the vascular anatomy. RADIATION DOSE REDUCTION: This exam was performed according to the departmental dose-optimization program which includes automated exposure control, adjustment of the mA and/or kV according to patient size and/or use of iterative reconstruction technique. CONTRAST:  75mL OMNIPAQUE  IOHEXOL  350 MG/ML SOLN COMPARISON:  03/14/2024 FINDINGS: Cardiovascular: This is a technically adequate evaluation of the pulmonary vasculature. There is a subsegmental right lower lobe pulmonary embolus, reference image 78/4. Nonocclusive filling defect within the left upper lobe pulmonary artery reference image 54/4 consistent with age indeterminate pulmonary  embolus. Clot burden is minimal. No right heart strain. The heart is unremarkable without pericardial effusion. Normal caliber of the thoracic aorta. Atherosclerosis of the aorta and coronary vasculature. Mediastinum/Nodes: Stable borderline enlarged mediastinal lymph nodes, likely reactive. No pathologic adenopathy. Thyroid , trachea, and esophagus are stable. Lungs/Pleura: Stable areas of bilobed bilateral pulmonary fibrosis and scarring, with minimal traction bronchiectasis unchanged. Stable areas of areas of ground-glass opacity most pronounced in the upper lobes consistent with pneumonitis. No dense consolidation, effusion, or pneumothorax. Central airways are patent. Upper Abdomen: No acute abnormality. Musculoskeletal: No acute or destructive bony abnormalities. Reconstructed images demonstrate no additional findings. Review of the MIP images confirms the above findings. IMPRESSION: 1. Small bilateral pulmonary emboli within the right lower and left upper lobes. Minimal clot burden, with no evidence of right heart strain. 2. Stable findings of chronic pulmonary fibrosis, without appreciable change in the scattered ground-glass opacity seen previously consistent with pneumonitis. 3. Aortic Atherosclerosis (ICD10-I70.0). Coronary artery atherosclerosis. Critical Value/emergent results were called by telephone at the time of interpretation on 05/22/2024 at 4:47 pm to provider ANDREW TEE, who verbally acknowledged these results. Electronically Signed   By: Ozell Daring M.D.   On: 05/22/2024 16:51   DG Chest 2 View Result Date: 05/22/2024 CLINICAL DATA:  Shortness of breath.  Hypoxia. EXAM: CHEST - 2 VIEW COMPARISON:  03/14/2024 FINDINGS: Normal-sized heart. Stable extensive chronic interstitial prominence. Stable right middle lobe linear scarring. Unremarkable bones. IMPRESSION: 1. No acute abnormality. 2. Stable extensive chronic interstitial lung disease. Electronically Signed   By: Elspeth Bathe M.D.   On:  05/22/2024 13:21   ECHOCARDIOGRAM COMPLETE BUBBLE STUDY Result Date: 05/12/2024    ECHOCARDIOGRAM REPORT   Patient Name:   CHIYO FAY Elson Date of Exam: 05/12/2024 Medical Rec #:  [id number]         Height:       59.0 in Accession #:    [id number]        Weight:       145.5 lb Date of Birth:  03-16-1951         BSA:          1.611 m Patient Age:    73 years          BP:           142/68 mmHg Patient Gender: F                 HR:           72 bpm. Exam Location:  Outpatient Procedure: 2D Echo, Color Doppler and Cardiac Doppler (Both Spectral and Color            Flow Doppler were utilized during procedure). Indications:    Dyspnea  History:        Patient has prior history of Echocardiogram examinations, most                 recent 03/12/2023.  Sonographer:    Benard Stallion Referring Phys: [id number] MURALI RAMASWAMY IMPRESSIONS  1. Left ventricular ejection fraction, by estimation, is 65 to 70%. The left ventricle has normal function. The left ventricle has no regional wall motion abnormalities. There is mild concentric left ventricular hypertrophy. Left ventricular diastolic parameters are consistent with Grade I diastolic dysfunction (impaired relaxation).  2. Right ventricular systolic function is normal. The right ventricular size is normal.  3. Left atrial size was mildly dilated.  4. The mitral valve is abnormal. Mild mitral valve regurgitation. No evidence of mitral stenosis. There is mild late systolic prolapse of the medial scallop of the posterior leaflet of the mitral valve.  5. The aortic valve is tricuspid. There is mild calcification of the aortic valve. Aortic valve regurgitation is trivial. Aortic valve sclerosis/calcification is present, without any evidence of aortic stenosis. Aortic valve area, by VTI measures 1.87 cm. Aortic valve mean gradient measures 4.0 mmHg. Aortic valve Vmax measures 1.37 m/s.  6. The inferior vena cava is normal in size with greater than 50% respiratory variability,  suggesting right atrial pressure of 3 mmHg. FINDINGS  Left Ventricle: Left ventricular ejection fraction, by estimation, is 65 to 70%. The left ventricle has normal function. The left ventricle has no regional wall motion abnormalities. The left ventricular internal cavity size was normal in size. There is  mild concentric left ventricular hypertrophy. Left ventricular diastolic parameters are consistent with Grade I diastolic dysfunction (impaired relaxation). Right Ventricle: The right ventricular size is normal. No increase in right ventricular wall thickness. Right ventricular systolic function is normal. Left Atrium: Left atrial size was mildly dilated. Right Atrium: Right atrial size was normal in size. Pericardium: There is no evidence of pericardial effusion. Mitral Valve: The mitral valve is abnormal. There is mild late systolic prolapse of the medial scallop of the posterior leaflet of the mitral valve. Mild mitral valve regurgitation. No evidence of mitral valve stenosis. Tricuspid Valve: The tricuspid valve is normal in structure. Tricuspid valve regurgitation is mild . No evidence of tricuspid stenosis. Aortic Valve: The aortic valve is tricuspid. There is mild calcification of the aortic valve.  Aortic valve regurgitation is trivial. Aortic regurgitation PHT measures 580 msec. Aortic valve sclerosis/calcification is present, without any evidence of aortic stenosis. Aortic valve mean gradient measures 4.0 mmHg. Aortic valve peak gradient measures 7.5 mmHg. Aortic valve area, by VTI measures 1.87 cm. Pulmonic Valve: The pulmonic valve was normal in structure. Pulmonic valve regurgitation is trivial. No evidence of pulmonic stenosis. Aorta: The aortic root is normal in size and structure. Venous: The inferior vena cava is normal in size with greater than 50% respiratory variability, suggesting right atrial pressure of 3 mmHg. IAS/Shunts: No atrial level shunt detected by color flow Doppler.  LEFT  VENTRICLE PLAX 2D LVIDd:         4.20 cm   Diastology LVIDs:         2.80 cm   LV e' medial:    5.00 cm/s LV PW:         0.80 cm   LV E/e' medial:  12.4 LV IVS:        0.80 cm   LV e' lateral:   5.00 cm/s LVOT diam:     1.80 cm   LV E/e' lateral: 12.4 LV SV:         52 LV SV Index:   32 LVOT Area:     2.54 cm  RIGHT VENTRICLE RV Basal diam:  3.05 cm RV Mid diam:    2.70 cm RV S prime:     16.40 cm/s TAPSE (M-mode): 1.9 cm LEFT ATRIUM             Index        RIGHT ATRIUM           Index LA diam:        3.50 cm 2.17 cm/m   RA Area:     10.40 cm LA Vol (A2C):   41.8 ml 25.94 ml/m  RA Volume:   18.20 ml  11.30 ml/m LA Vol (A4C):   62.9 ml 39.04 ml/m LA Biplane Vol: 52.4 ml 32.52 ml/m  AORTIC VALVE AV Area (Vmax):    1.88 cm AV Area (Vmean):   1.83 cm AV Area (VTI):     1.87 cm AV Vmax:           137.00 cm/s AV Vmean:          92.300 cm/s AV VTI:            0.278 m AV Peak Grad:      7.5 mmHg AV Mean Grad:      4.0 mmHg LVOT Vmax:         101.00 cm/s LVOT Vmean:        66.300 cm/s LVOT VTI:          0.204 m LVOT/AV VTI ratio: 0.73 AI PHT:            580 msec  AORTA Ao Root diam: 2.70 cm Ao Asc diam:  2.90 cm MITRAL VALVE                TRICUSPID VALVE MV Area (PHT): 3.77 cm     TR Peak grad:   31.8 mmHg MV Decel Time: 201 msec     TR Vmax:        282.00 cm/s MV E velocity: 62.20 cm/s MV A velocity: 106.00 cm/s  SHUNTS MV E/A ratio:  0.59         Systemic VTI:  0.20 m  Systemic Diam: 1.80 cm Toribio Fuel MD Electronically signed by Toribio Fuel MD Signature Date/Time: 05/12/2024/6:10:50 PM    Final     Administration History     None          Latest Ref Rng & Units 04/05/2024    8:21 AM 10/01/2023    9:49 AM 06/04/2023   10:12 AM 03/08/2023   10:49 AM 12/09/2022   11:48 AM 07/21/2022    9:50 AM 01/06/2022    9:51 AM  PFT Results  FVC-Pre L 1.52  1.43  1.44  1.45  1.53  1.54  1.58   FVC-Predicted Pre % 64  60  60  64  66  67  67   Pre FEV1/FVC % % 87  92  91  93   87  91  90   FEV1-Pre L 1.33  1.31  1.30  1.35  1.32  1.40  1.43   FEV1-Predicted Pre % 76  73  73  80  77  81  81   DLCO uncorrected ml/min/mmHg 8.29  9.73  10.82  8.11  9.64  8.16  8.62   DLCO UNC% % 49  58  64  50  59  50  53   DLCO corrected ml/min/mmHg 8.24  9.73  10.82  7.81  9.41  7.86  8.62   DLCO COR %Predicted % 49  58  64  48  58  48  53   DLVA Predicted % 78  81  85  65  81  64  75   TLC L    3.02      TLC % Predicted %    70      RV % Predicted %    70        Lab Results  Component Value Date   NITRICOXIDE 10 01/13/2016        Assessment & Plan:   Encounter Diagnoses  Name Primary?   Rectal bleeding Yes   High risk medication use    ILD (interstitial lung disease) (HCC)    Acute pulmonary embolism without acute cor pulmonale, unspecified pulmonary embolism type (HCC)     Rectal bleeding: -  Based on description seems more likely to be a LGIB associated with hemorrhoids and anal fissure -  Agree with holding Eliquis  until CBC reviewed.  Based on H/H would lean towards resuming Eliquis  as bleeding has been minimal and risk for developing worsening/new PE is very high given her history  High risk medication use: - noted  ILD:   -  no change in regimen presently -  Keep follow up appointment as scheduled with Dr. Geronimo on 06/14/2024  Acute PE: -  High risk for worsening/new PE development; Plan to resume Eliquis  if CBC stable especially in the setting of worsening dyspnea and increased oxygen  requirement     Candis Dandy, PA-C 06/06/2024

## 2024-06-06 NOTE — Telephone Encounter (Signed)
 Pt was seen in the er 7/13. Will await further communication from pt as have not been able to reach her by phone or leave message.

## 2024-06-06 NOTE — Progress Notes (Signed)
 Labs ordered under Candis Dandy

## 2024-06-07 ENCOUNTER — Ambulatory Visit: Payer: Self-pay

## 2024-06-07 NOTE — Telephone Encounter (Signed)
 Per patient's mychart message, Please let me know you found fax of blood work. I'd rather not give any more blood at the moment if not absolutely necessary. I will keep an eye on my sinuses.

## 2024-06-07 NOTE — Telephone Encounter (Signed)
 Have you seen any labs come through on this pt?

## 2024-06-07 NOTE — Telephone Encounter (Signed)
 Please advise if fax of CBC has been received and reviewed and any recommendations?

## 2024-06-07 NOTE — Telephone Encounter (Signed)
 [facility] is calling to find out if we received the fax with lab work for the patient. If so please leave a VM at [phone] option 2 because they close at 12pm

## 2024-06-07 NOTE — Telephone Encounter (Signed)
 Will send to [facility] pt- [facility] I think you are working with this pt

## 2024-06-08 ENCOUNTER — Encounter (HOSPITAL_COMMUNITY)

## 2024-06-08 NOTE — Progress Notes (Unsigned)
 Ellouise Console, PA-C [address] [place], [place]  [address] Phone: [phone]   Primary Care Physician: Geronimo Amel, MD  Primary Gastroenterologist:  Ellouise Console, PA-C / Dr. Gordy Starch   Chief Complaint: Hospital follow-up of rectal bleeding.       HPI:   Rhonda [name] is a 73 y.o. female presents for hospital follow-up of rectal bleeding.  She was hospitalized 05/22/2024 until 05/30/2024 for shortness of breath.  Medical history of interstitial lung disease, CAD, GERD, HTN, HLP chronic hypersensitive pneumonitis on CellCept  prednisone .  Currently on oxygen  2 L.  She was found to have interstitial lung disease flareup, small bilateral pulmonary emboli, low blood clot burden, and chronic hypersensitive pneumonitis.  Has follow-up with pulmonology.  She developed rectal bleeding on 05/26/2024 while in the hospital.  Eliquis  was held.  Underwent colonoscopy 05/28/2024 by Dr. Starch which showed anal fissure, internal hemorrhoids, and multiple colon polyps removed.  Pathology showed multiple tubular adenomas.  No dysplasia or malignancy.  Total of 12 polyps were removed ranging in size from 3 mm to 10 mm. Rectal bleeding was thought due to anal fissure and hemorrhoids.  Treated with Anusol  suppository and nitroglycerin  ointment.  Eliquis  was restarted after procedure.  Fortunately, her hemoglobin remained normal during hospitalization between 12 and 13 g.  Last lab 05/30/2024 showed hemoglobin 12.6.  Current symptoms:  Current Outpatient Medications  Medication Sig Dispense Refill   albuterol  (PROAIR  HFA) 108 (90 Base) MCG/ACT inhaler Inhale 2 puffs into the lungs every 4 (four) hours as needed for wheezing. Or coughing spells.  You may use 2 Puffs 5-10 minutes before exercise. (Patient taking differently: Inhale 2 puffs into the lungs every 4 (four) hours as needed for wheezing (or coughing. May also use 2 puffs 5-10 minutes before exercise.).) 1 each 3   albuterol  (PROVENTIL )  (2.5 MG/3ML) 0.083% nebulizer solution Take 3 mLs (2.5 mg total) by nebulization every 6 (six) hours as needed for wheezing or shortness of breath. 360 mL 0   Alpha-D-Galactosidase (BEANO PO) Take 1 tablet by mouth as needed (as directed, if eating onion or garlic).     ALPRAZolam  (XANAX ) 0.25 MG tablet Take 0.25 mg by mouth at bedtime as needed for anxiety or sleep.     AMBULATORY NON FORMULARY MEDICATION See admin instructions. Allergy injections on Mondays and hold when sick     apixaban  (ELIQUIS ) 5 MG TABS tablet Take 1 tablet (5 mg total) by mouth 2 (two) times daily. (Patient not taking: Reported on 06/06/2024) 60 tablet 0   arformoterol  (BROVANA ) 15 MCG/2ML NEBU Take 2 mLs (15 mcg total) by nebulization 2 (two) times daily. 120 mL 0   ascorbic acid (VITAMIN C) 500 MG tablet Take 500 mg by mouth daily.     ASHWAGANDHA PO Take 1 capsule by mouth every other day.     azelastine (ASTELIN) 0.1 % nasal spray Place 1 spray into both nostrils in the morning. Use in each nostril as directed     budesonide  (PULMICORT ) 0.5 MG/2ML nebulizer solution Take 2 mLs (0.5 mg total) by nebulization in the morning and at bedtime. 120 mL 0   Calcium  Carb-Cholecalciferol (CALCIUM  500 + D PO) Take 1 tablet by mouth daily.     chlorpheniramine (CHLOR-TRIMETON) 4 MG tablet Take 2 mg by mouth 2 (two) times daily as needed for allergies or rhinitis.     cholecalciferol (VITAMIN D3) 25 MCG (1000 UNIT) tablet Take 1,000 Units by mouth every other  day.     Continuous Glucose Sensor (DEXCOM G7 SENSOR) MISC Check blood sugar fasting and before meals and at bedtime. 4 each 0   dicyclomine  (BENTYL ) 10 MG capsule Take 10 mg by mouth 2 (two) times daily as needed for spasms.     diltiazem  (CARDIZEM  CD) 120 MG 24 hr capsule Take 1 capsule (120 mg total) by mouth daily. 90 capsule 3   EPINEPHrine  0.3 mg/0.3 mL IJ SOAJ injection Inject 0.3 mg into the muscle as needed for anaphylaxis.     ezetimibe  (ZETIA ) 10 MG tablet Take 1 tablet  (10 mg total) by mouth daily. (Patient taking differently: Take 10 mg by mouth at bedtime.) 90 tablet 3   fluticasone  (FLONASE ) 50 MCG/ACT nasal spray Spray 2 sprays into each nostril every day. (Patient taking differently: Place 2 sprays into both nostrils in the morning.) 48 g 12   HUMALOG  KWIKPEN 100 UNIT/ML KwikPen Inject 8-10 Units into the skin See admin instructions. Inject 10 units into the skin before breakfast & lunch and 8 before supper     hydrocortisone  (ANUSOL -HC) 25 MG suppository Place 1 suppository (25 mg total) rectally at bedtime for 12 days. 12 suppository 0   Hypromellose (NATURES TEARS OP) Place 1 drop into both eyes in the morning and at bedtime.     Insulin  Disposable Pump (OMNIPOD 5 G7 PODS, GEN 5,) MISC Inject 1 Device into the skin every 3 (three) days.     levocetirizine (XYZAL ) 5 MG tablet Take 5 mg by mouth at bedtime.     metFORMIN  (GLUCOPHAGE -XR) 500 MG 24 hr tablet Take 500 mg by mouth daily with breakfast.     metoprolol  succinate (TOPROL -XL) 25 MG 24 hr tablet Take 1 tablet (25 mg total) by mouth daily. 90 tablet 3   montelukast  (SINGULAIR ) 10 MG tablet TAKE 1 TABLET BY MOUTH EVERYDAY AT BEDTIME (Patient taking differently: Take 10 mg by mouth at bedtime.) 90 tablet 2   Multiple Vitamin (MULTIVITAMIN) tablet Take 1 tablet by mouth daily with breakfast.     mycophenolate  (CELLCEPT ) 500 MG tablet Take 2 tablets (1,000 mg total) by mouth 2 (two) times daily. 120 tablet 0   nitroGLYCERIN  (NITROGLYN) 2 % ointment Apply 0.25 inches topically 2 (two) times daily. Apply To anus and surrounding tissue. Use pea size amount 30 g 0   nitroGLYCERIN  (NITROSTAT ) 0.4 MG SL tablet Place 1 tablet (0.4 mg total) under the tongue every 5 (five) minutes as needed for chest pain. 75 tablet 3   nystatin  cream (MYCOSTATIN ) Apply 1 Application topically 2 (two) times daily as needed (for irritation- affected sites).     omeprazole  (PRILOSEC  OTC) 20 MG tablet Take 10 mg by mouth See admin  instructions. Take 10 mg by mouth at 4:30 AM     OXYGEN  Inhale 4-5 L/min into the lungs See admin instructions. Inhale 4-5 L/min at bedtime and as needed for shortness of breath during the day     pravastatin  (PRAVACHOL ) 40 MG tablet Take 1 tablet (40 mg total) by mouth every evening. (Patient taking differently: Take 40 mg by mouth at bedtime.) 90 tablet 1   predniSONE  (DELTASONE ) 20 MG tablet Take 3 tablets (60 mg total) by mouth daily with breakfast. 90 tablet 0   PREPARATION H 0.25-50 % GEL Place 1 application  rectally 2 (two) times daily as needed (for hemorrhoids).     revefenacin  (YUPELRI ) 175 MCG/3ML nebulizer solution Inhale 1 vial by nebulization daily. 90 mL 0   sodium chloride  (  OCEAN) 0.65 % SOLN nasal spray Place 1 spray into both nostrils as needed for congestion.     Sodium Chloride , Inhalant, 7 % NEBU Use per nebulizer once a day to help clear sputum 120 mL 3   sulfamethoxazole -trimethoprim  (BACTRIM  DS) 800-160 MG tablet Take 1 tablet by mouth every Monday, Wednesday, and Friday.     No current facility-administered medications for this visit.    Allergies as of 06/09/2024 - Review Complete 06/06/2024  Allergen Reaction Noted   Ozempic (0.25 or 0.5 mg-dose) [semaglutide(0.25 or 0.5mg -dos)] Other (See Comments) 03/14/2024   Tape Other (See Comments) 03/14/2024   Betadine [povidone iodine] Other (See Comments) 09/13/2012   Codeine Nausea And Vomiting 06/03/2017   Garlic Diarrhea 10/31/2018   Hydrocodone  Nausea And Vomiting 09/28/2019   Iodine Other (See Comments) 05/22/2024   Macrobid [nitrofurantoin] Other (See Comments) 03/19/2020   Ofev  [nintedanib] Other (See Comments) 03/19/2020   Onion Diarrhea 10/31/2018   Shellfish allergy Nausea And Vomiting 09/23/2012   Statins Other (See Comments) 06/03/2017   Sulfa  antibiotics Other (See Comments) 11/02/2019    Past Medical History:  Diagnosis Date   Allergy    Arthritis    history spinal stenosis. osteoarthritis right hip    Asthma    Cataract    Coronary artery calcification seen on CAT scan 08/19/2017   >300 on CT scan 08/2017   DDD (degenerative disc disease), thoracic    Depression    DOE (dyspnea on exertion)    a. 04/2010 Lexi MV EF 71%, no ischemia/infarct;     Fatty liver    GERD (gastroesophageal reflux disease)    H/O steroid therapy    Steroid use orally over 4 yrs- for Lung Fibrosis   Heart palpitations 02/28/2015   Helicobacter pylori ab+    Hemorrhoids    Hiatal hernia    High cholesterol    History of chronic bronchitis    as child   History of migraine    History of MRSA infection    Hyperlipidemia, mixed 08/19/2017   Hyperplastic colon polyp 2007   IBS (irritable bowel syndrome)    Inguinal hernia    right   Insulin  resistance    past   Interstitial lung disease (HCC)    MVP (mitral valve prolapse)    Posterior mitral valve leaflet with mild MR   Pneumonia    Pneumonitis, hypersensitivity (HCC)    a. 09/2012 s/p Bx - ? 2/2 bird, mold, oil paint exposure ->on steroids, followed by pulm.   PONV (postoperative nausea and vomiting)    Pre-diabetes    takes Metformin    Pulmonary fibrosis (HCC)    Dr. Geronimo follows- stable at present   PVC (premature ventricular contraction) 08/19/2017   Rapid heart rate    Dr. Shlomo follows- last visit Epic note 9'16   Squamous cell carcinoma of skin    Tinnitus, right ear    Vocal cord ulcer     Past Surgical History:  Procedure Laterality Date   34 HOUR PH STUDY N/A 02/21/2018   Procedure: 24 HOUR PH STUDY;  Surgeon: Shila Gustav GAILS, MD;  Location: [facility];  Service: Endoscopy;  Laterality: N/A;   BREAST BIOPSY Right 2009   BIOPSY, pt denies   BREAST BIOPSY Left 2003   Benign    CATARACT EXTRACTION Left    CESAREAN SECTION     COLONOSCOPY     COLONOSCOPY N/A 05/28/2024   Procedure: COLONOSCOPY;  Surgeon: Albertus Gordy HERO, MD;  Location: [facility];  Service: Gastroenterology;  Laterality: N/A;   ESOPHAGEAL MANOMETRY N/A  02/21/2018   Procedure: ESOPHAGEAL MANOMETRY (EM);  Surgeon: Shila Gustav GAILS, MD;  Location: [facility];  Service: Endoscopy;  Laterality: N/A;   FOOT FRACTURE SURGERY  2006 or 2007   right   HYMENECTOMY     LUNG BIOPSY  09/28/2012   Procedure: LUNG BIOPSY;  Surgeon: Elspeth JAYSON Millers, MD;  Location: [facility] OR;  Service: Thoracic;  Laterality: N/A;  lung biopsies tims three   RIGHT HEART CATH N/A 04/04/2024   Procedure: RIGHT HEART CATH;  Surgeon: Rolan Ezra RAMAN, MD;  Location: [facility] INVASIVE CV LAB;  Service: Cardiovascular;  Laterality: N/A;   SQUAMOUS CELL CARCINOMA EXCISION Left    left arm   TOTAL HIP ARTHROPLASTY Right 09/10/2015   Procedure: RIGHT TOTAL HIP ARTHROPLASTY ANTERIOR APPROACH;  Surgeon: Donnice Car, MD;  Location: [facility] ORS;  Service: Orthopedics;  Laterality: Right;   TOTAL HIP ARTHROPLASTY Left 10/03/2019   Procedure: TOTAL HIP ARTHROPLASTY ANTERIOR APPROACH;  Surgeon: Car Donnice, MD;  Location: [facility];  Service: Orthopedics;  Laterality: Left;  70 mins   TUBAL LIGATION     UPPER GI ENDOSCOPY     VIDEO ASSISTED THORACOSCOPY  09/28/2012   Procedure: VIDEO ASSISTED THORACOSCOPY;  Surgeon: Elspeth JAYSON Millers, MD;  Location: [facility] OR;  Service: Thoracic;  Laterality: Right;   VIDEO BRONCHOSCOPY  11/19/2011   Procedure: VIDEO BRONCHOSCOPY WITH FLUORO;  Surgeon: Dorethia Cave, MD;  Location: [facility] ENDOSCOPY;  Service: Endoscopy;;   VIDEO BRONCHOSCOPY Bilateral 05/23/2024   Procedure: VIDEO BRONCHOSCOPY WITHOUT FLUORO;  Surgeon: Mannam, Praveen, MD;  Location: [facility];  Service: Cardiopulmonary;  Laterality: Bilateral;    Review of Systems:    All systems reviewed and negative except where noted in HPI.    Physical Exam:  There were no vitals taken for this visit. No LMP recorded. Patient is postmenopausal.  General: Well-nourished, well-developed in no acute distress.  Lungs: Clear to auscultation bilaterally. Non-labored. Heart: Regular rate and rhythm, no murmurs  rubs or gallops.  Abdomen: Bowel sounds are normal; Abdomen is Soft; No hepatosplenomegaly, masses or hernias;  No Abdominal Tenderness; No guarding or rebound tenderness. Neuro: Alert and oriented x 3.  Grossly intact.  Psych: Alert and cooperative, normal mood and affect.   Imaging Studies: CT Head Wo Contrast Result Date: 06/04/2024 CLINICAL DATA:  Clemens.  Hit head. EXAM: CT HEAD WITHOUT CONTRAST CT CERVICAL SPINE WITHOUT CONTRAST TECHNIQUE: Multidetector CT imaging of the head and cervical spine was performed following the standard protocol without intravenous contrast. Multiplanar CT image reconstructions of the cervical spine were also generated. RADIATION DOSE REDUCTION: This exam was performed according to the departmental dose-optimization program which includes automated exposure control, adjustment of the mA and/or kV according to patient size and/or use of iterative reconstruction technique. COMPARISON:  None Available. FINDINGS: CT HEAD FINDINGS Brain: Mild age related cerebral atrophy, ventriculomegaly and periventricular white matter disease. No extra-axial fluid collections are identified. No CT findings for acute hemispheric infarction or intracranial hemorrhage. No mass lesions. The brainstem and cerebellum are normal. Vascular: No hyperdense vessel or unexpected calcification. Skull: No skull fracture bone lesion.  Moderate osteoporosis. Sinuses/Orbits: The paranasal sinuses and mastoid air cells are clear. Other: No scalp lesions or scalp hematoma. CT CERVICAL SPINE FINDINGS Alignment: Normal Skull base and vertebrae: No acute fracture. No primary bone lesion or focal pathologic process. Moderate to advanced osteoporosis. Soft tissues and spinal canal: No prevertebral fluid or swelling. No visible canal hematoma.  Disc levels: The spinal canal is fairly generous. No significant canal stenosis. Uncinate spurring changes and facet disease contribute to mild multilevel foraminal stenosis.  Upper chest: The lung apices demonstrate advanced lung disease but no apical lung lesion. Other: No neck mass, adenopathy or hematoma. IMPRESSION: 1. Mild age related cerebral atrophy, ventriculomegaly and periventricular white matter disease. 2. No acute intracranial findings or skull fracture. 3. Normal alignment of the cervical vertebral bodies and no acute fracture. 4. Moderate to advanced osteoporosis. Electronically Signed   By: MYRTIS Stammer M.D.   On: 06/04/2024 14:51   CT Cervical Spine Wo Contrast Result Date: 06/04/2024 CLINICAL DATA:  Clemens.  Hit head. EXAM: CT HEAD WITHOUT CONTRAST CT CERVICAL SPINE WITHOUT CONTRAST TECHNIQUE: Multidetector CT imaging of the head and cervical spine was performed following the standard protocol without intravenous contrast. Multiplanar CT image reconstructions of the cervical spine were also generated. RADIATION DOSE REDUCTION: This exam was performed according to the departmental dose-optimization program which includes automated exposure control, adjustment of the mA and/or kV according to patient size and/or use of iterative reconstruction technique. COMPARISON:  None Available. FINDINGS: CT HEAD FINDINGS Brain: Mild age related cerebral atrophy, ventriculomegaly and periventricular white matter disease. No extra-axial fluid collections are identified. No CT findings for acute hemispheric infarction or intracranial hemorrhage. No mass lesions. The brainstem and cerebellum are normal. Vascular: No hyperdense vessel or unexpected calcification. Skull: No skull fracture bone lesion.  Moderate osteoporosis. Sinuses/Orbits: The paranasal sinuses and mastoid air cells are clear. Other: No scalp lesions or scalp hematoma. CT CERVICAL SPINE FINDINGS Alignment: Normal Skull base and vertebrae: No acute fracture. No primary bone lesion or focal pathologic process. Moderate to advanced osteoporosis. Soft tissues and spinal canal: No prevertebral fluid or swelling. No visible  canal hematoma. Disc levels: The spinal canal is fairly generous. No significant canal stenosis. Uncinate spurring changes and facet disease contribute to mild multilevel foraminal stenosis. Upper chest: The lung apices demonstrate advanced lung disease but no apical lung lesion. Other: No neck mass, adenopathy or hematoma. IMPRESSION: 1. Mild age related cerebral atrophy, ventriculomegaly and periventricular white matter disease. 2. No acute intracranial findings or skull fracture. 3. Normal alignment of the cervical vertebral bodies and no acute fracture. 4. Moderate to advanced osteoporosis. Electronically Signed   By: MYRTIS Stammer M.D.   On: 06/04/2024 14:51   VAS US  LOWER EXTREMITY VENOUS (DVT) Result Date: 05/24/2024  Lower Venous DVT Study Patient Name:  VIRIGINIA AMENDOLA Wehrly  Date of Exam:   05/24/2024 Medical Rec #: [id number]          Accession #:    [id number] Date of Birth: 12-Mar-1951          Patient Gender: F Patient Age:   43 years Exam [facility]  [facility] Procedure:      VAS US  LOWER EXTREMITY VENOUS (DVT) Referring Phys: LONNA CODER --------------------------------------------------------------------------------  Indications: Pulmonary embolism.  Comparison Study: No previous exams Performing Technologist: Jody Hill RVT, RDMS  Examination Guidelines: A complete evaluation includes B-mode imaging, spectral Doppler, color Doppler, and power Doppler as needed of all accessible portions of each vessel. Bilateral testing is considered an integral part of a complete examination. Limited examinations for reoccurring indications may be performed as noted. The reflux portion of the exam is performed with the patient in reverse Trendelenburg.  +---------+---------------+---------+-----------+----------+--------------+ RIGHT    CompressibilityPhasicitySpontaneityPropertiesThrombus Aging +---------+---------------+---------+-----------+----------+--------------+ CFV      Full           Yes  Yes                                 +---------+---------------+---------+-----------+----------+--------------+ SFJ      Full                                                        +---------+---------------+---------+-----------+----------+--------------+ FV Prox  Full           Yes      Yes                                 +---------+---------------+---------+-----------+----------+--------------+ FV Mid   Full           Yes      Yes                                 +---------+---------------+---------+-----------+----------+--------------+ FV DistalFull           Yes      Yes                                 +---------+---------------+---------+-----------+----------+--------------+ PFV      Full                                                        +---------+---------------+---------+-----------+----------+--------------+ POP      Full           Yes      Yes                                 +---------+---------------+---------+-----------+----------+--------------+ PTV      Full                                                        +---------+---------------+---------+-----------+----------+--------------+ PERO     Full                                                        +---------+---------------+---------+-----------+----------+--------------+   +---------+---------------+---------+-----------+----------+--------------+ LEFT     CompressibilityPhasicitySpontaneityPropertiesThrombus Aging +---------+---------------+---------+-----------+----------+--------------+ CFV      Full           Yes      Yes                                 +---------+---------------+---------+-----------+----------+--------------+ SFJ      Full                                                        +---------+---------------+---------+-----------+----------+--------------+  FV Prox  Full           Yes      Yes                                  +---------+---------------+---------+-----------+----------+--------------+ FV Mid   Full           Yes      Yes                                 +---------+---------------+---------+-----------+----------+--------------+ FV DistalFull           Yes      Yes                                 +---------+---------------+---------+-----------+----------+--------------+ PFV      Full                                                        +---------+---------------+---------+-----------+----------+--------------+ POP      Full           Yes      Yes                                 +---------+---------------+---------+-----------+----------+--------------+ PTV      Full                                                        +---------+---------------+---------+-----------+----------+--------------+ PERO     Full                                                        +---------+---------------+---------+-----------+----------+--------------+     Summary: BILATERAL: - No evidence of deep vein thrombosis seen in the lower extremities, bilaterally. - RIGHT: - A cystic structure is found in the popliteal fossa (1.76 x 0.752 x 1.61 cm).  LEFT: - No cystic structure found in the popliteal fossa.  *See table(s) above for measurements and observations. Electronically signed by Gaile New MD on 05/24/2024 at 9:56:33 PM.    Final    CT CHEST ABDOMEN PELVIS W CONTRAST Result Date: 05/24/2024 CLINICAL DATA:  Lymphadenopathy, unprovoked pulmonary embolism, concern for occult malignancy * Tracking Code: BO * EXAM: CT CHEST, ABDOMEN, AND PELVIS WITH CONTRAST TECHNIQUE: Multidetector CT imaging of the chest, abdomen and pelvis was performed following the standard protocol during bolus administration of intravenous contrast. RADIATION DOSE REDUCTION: This exam was performed according to the departmental dose-optimization program which includes automated exposure control, adjustment of the mA and/or  kV according to patient size and/or use of iterative reconstruction technique. CONTRAST:  OMNIPAQUE  IOHEXOL  300 MG/ML SOLN additional oral enteric contrast COMPARISON:  CT chest angiogram, 05/22/2024 FINDINGS: CT CHEST FINDINGS Cardiovascular: Aortic atherosclerosis. Mild cardiomegaly. Three-vessel coronary  artery calcifications no pericardial effusion. Mediastinum/Nodes: No enlarged mediastinal, hilar, or axillary lymph nodes. Small hiatal hernia. Thyroid  gland, trachea, and esophagus demonstrate no significant findings. Lungs/Pleura: Moderate pulmonary fibrosis in a heterogeneously distributed pattern, prominent areas of bronchiectasis peribronchovascular interstitial opacity, bronchiolectasis, and architectural distortion throughout the lungs, particularly in the right upper lobe and left lung base. Trace pleural effusions. Musculoskeletal: No chest wall abnormality. No acute osseous findings. CT ABDOMEN PELVIS FINDINGS Hepatobiliary: No solid liver abnormality is seen. No gallstones, gallbladder wall thickening, or biliary dilatation. Pancreas: Unremarkable. No pancreatic ductal dilatation or surrounding inflammatory changes. Spleen: Normal in size without significant abnormality. Adrenals/Urinary Tract: Adrenal glands are unremarkable. Kidneys are normal, without renal calculi, solid lesion, or hydronephrosis. Bladder is unremarkable. Stomach/Bowel: Stomach is within normal limits. Appendix appears normal. No evidence of bowel wall thickening, distention, or inflammatory changes. Vascular/Lymphatic: Aortic atherosclerosis. No enlarged abdominal or pelvic lymph nodes. Reproductive: No mass or other abnormality. Other: No abdominal wall hernia or abnormality. No ascites. Musculoskeletal: No acute osseous findings. Status post bilateral hip total arthroplasty. IMPRESSION: 1. No evidence of lymphadenopathy or metastatic disease in the chest, abdomen, or pelvis. 2. Moderate pulmonary fibrosis, unchanged, in an  alternative diagnosis pattern by ATS pulmonary fibrosis criteria, imaging appearance again favoring chronic hypersensitivity pneumonitis. 3. Trace pleural effusions. 4. Mild cardiomegaly and coronary artery disease. Aortic Atherosclerosis (ICD10-I70.0). Electronically Signed   By: Marolyn JONETTA Jaksch M.D.   On: 05/24/2024 17:05   CT Angio Chest PE W/Cm &/Or Wo Cm Result Date: 05/22/2024 CLINICAL DATA:  Positive D-dimer, hypoxia, labored breathing, history of interstitial lung disease EXAM: CT ANGIOGRAPHY CHEST WITH CONTRAST TECHNIQUE: Multidetector CT imaging of the chest was performed using the standard protocol during bolus administration of intravenous contrast. Multiplanar CT image reconstructions and MIPs were obtained to evaluate the vascular anatomy. RADIATION DOSE REDUCTION: This exam was performed according to the departmental dose-optimization program which includes automated exposure control, adjustment of the mA and/or kV according to patient size and/or use of iterative reconstruction technique. CONTRAST:  75mL OMNIPAQUE  IOHEXOL  350 MG/ML SOLN COMPARISON:  03/14/2024 FINDINGS: Cardiovascular: This is a technically adequate evaluation of the pulmonary vasculature. There is a subsegmental right lower lobe pulmonary embolus, reference image 78/4. Nonocclusive filling defect within the left upper lobe pulmonary artery reference image 54/4 consistent with age indeterminate pulmonary embolus. Clot burden is minimal. No right heart strain. The heart is unremarkable without pericardial effusion. Normal caliber of the thoracic aorta. Atherosclerosis of the aorta and coronary vasculature. Mediastinum/Nodes: Stable borderline enlarged mediastinal lymph nodes, likely reactive. No pathologic adenopathy. Thyroid , trachea, and esophagus are stable. Lungs/Pleura: Stable areas of bilobed bilateral pulmonary fibrosis and scarring, with minimal traction bronchiectasis unchanged. Stable areas of areas of ground-glass opacity  most pronounced in the upper lobes consistent with pneumonitis. No dense consolidation, effusion, or pneumothorax. Central airways are patent. Upper Abdomen: No acute abnormality. Musculoskeletal: No acute or destructive bony abnormalities. Reconstructed images demonstrate no additional findings. Review of the MIP images confirms the above findings. IMPRESSION: 1. Small bilateral pulmonary emboli within the right lower and left upper lobes. Minimal clot burden, with no evidence of right heart strain. 2. Stable findings of chronic pulmonary fibrosis, without appreciable change in the scattered ground-glass opacity seen previously consistent with pneumonitis. 3. Aortic Atherosclerosis (ICD10-I70.0). Coronary artery atherosclerosis. Critical Value/emergent results were called by telephone at the time of interpretation on 05/22/2024 at 4:47 pm to provider ANDREW TEE, who verbally acknowledged these results. Electronically Signed   By: Ozell Delores HERO.D.  On: 05/22/2024 16:51   DG Chest 2 View Result Date: 05/22/2024 CLINICAL DATA:  Shortness of breath.  Hypoxia. EXAM: CHEST - 2 VIEW COMPARISON:  03/14/2024 FINDINGS: Normal-sized heart. Stable extensive chronic interstitial prominence. Stable right middle lobe linear scarring. Unremarkable bones. IMPRESSION: 1. No acute abnormality. 2. Stable extensive chronic interstitial lung disease. Electronically Signed   By: Elspeth Bathe M.D.   On: 05/22/2024 13:21   ECHOCARDIOGRAM COMPLETE BUBBLE STUDY Result Date: 05/12/2024    ECHOCARDIOGRAM REPORT   Patient Name:   TRISTY UDOVICH Evrard Date of Exam: 05/12/2024 Medical Rec #:  [id number]         Height:       59.0 in Accession #:    [id number]        Weight:       145.5 lb Date of Birth:  06-12-1951         BSA:          1.611 m Patient Age:    73 years          BP:           142/68 mmHg Patient Gender: F                 HR:           72 bpm. Exam Location:  Outpatient Procedure: 2D Echo, Color Doppler and Cardiac Doppler  (Both Spectral and Color            Flow Doppler were utilized during procedure). Indications:    Dyspnea  History:        Patient has prior history of Echocardiogram examinations, most                 recent 03/12/2023.  Sonographer:    Benard Stallion Referring Phys: [id number] MURALI RAMASWAMY IMPRESSIONS  1. Left ventricular ejection fraction, by estimation, is 65 to 70%. The left ventricle has normal function. The left ventricle has no regional wall motion abnormalities. There is mild concentric left ventricular hypertrophy. Left ventricular diastolic parameters are consistent with Grade I diastolic dysfunction (impaired relaxation).  2. Right ventricular systolic function is normal. The right ventricular size is normal.  3. Left atrial size was mildly dilated.  4. The mitral valve is abnormal. Mild mitral valve regurgitation. No evidence of mitral stenosis. There is mild late systolic prolapse of the medial scallop of the posterior leaflet of the mitral valve.  5. The aortic valve is tricuspid. There is mild calcification of the aortic valve. Aortic valve regurgitation is trivial. Aortic valve sclerosis/calcification is present, without any evidence of aortic stenosis. Aortic valve area, by VTI measures 1.87 cm. Aortic valve mean gradient measures 4.0 mmHg. Aortic valve Vmax measures 1.37 m/s.  6. The inferior vena cava is normal in size with greater than 50% respiratory variability, suggesting right atrial pressure of 3 mmHg. FINDINGS  Left Ventricle: Left ventricular ejection fraction, by estimation, is 65 to 70%. The left ventricle has normal function. The left ventricle has no regional wall motion abnormalities. The left ventricular internal cavity size was normal in size. There is  mild concentric left ventricular hypertrophy. Left ventricular diastolic parameters are consistent with Grade I diastolic dysfunction (impaired relaxation). Right Ventricle: The right ventricular size is normal. No increase in  right ventricular wall thickness. Right ventricular systolic function is normal. Left Atrium: Left atrial size was mildly dilated. Right Atrium: Right atrial size was normal in size. Pericardium: There is no evidence of pericardial effusion. Mitral  Valve: The mitral valve is abnormal. There is mild late systolic prolapse of the medial scallop of the posterior leaflet of the mitral valve. Mild mitral valve regurgitation. No evidence of mitral valve stenosis. Tricuspid Valve: The tricuspid valve is normal in structure. Tricuspid valve regurgitation is mild . No evidence of tricuspid stenosis. Aortic Valve: The aortic valve is tricuspid. There is mild calcification of the aortic valve. Aortic valve regurgitation is trivial. Aortic regurgitation PHT measures 580 msec. Aortic valve sclerosis/calcification is present, without any evidence of aortic stenosis. Aortic valve mean gradient measures 4.0 mmHg. Aortic valve peak gradient measures 7.5 mmHg. Aortic valve area, by VTI measures 1.87 cm. Pulmonic Valve: The pulmonic valve was normal in structure. Pulmonic valve regurgitation is trivial. No evidence of pulmonic stenosis. Aorta: The aortic root is normal in size and structure. Venous: The inferior vena cava is normal in size with greater than 50% respiratory variability, suggesting right atrial pressure of 3 mmHg. IAS/Shunts: No atrial level shunt detected by color flow Doppler.  LEFT VENTRICLE PLAX 2D LVIDd:         4.20 cm   Diastology LVIDs:         2.80 cm   LV e' medial:    5.00 cm/s LV PW:         0.80 cm   LV E/e' medial:  12.4 LV IVS:        0.80 cm   LV e' lateral:   5.00 cm/s LVOT diam:     1.80 cm   LV E/e' lateral: 12.4 LV SV:         52 LV SV Index:   32 LVOT Area:     2.54 cm  RIGHT VENTRICLE RV Basal diam:  3.05 cm RV Mid diam:    2.70 cm RV S prime:     16.40 cm/s TAPSE (M-mode): 1.9 cm LEFT ATRIUM             Index        RIGHT ATRIUM           Index LA diam:        3.50 cm 2.17 cm/m   RA Area:      10.40 cm LA Vol (A2C):   41.8 ml 25.94 ml/m  RA Volume:   18.20 ml  11.30 ml/m LA Vol (A4C):   62.9 ml 39.04 ml/m LA Biplane Vol: 52.4 ml 32.52 ml/m  AORTIC VALVE AV Area (Vmax):    1.88 cm AV Area (Vmean):   1.83 cm AV Area (VTI):     1.87 cm AV Vmax:           137.00 cm/s AV Vmean:          92.300 cm/s AV VTI:            0.278 m AV Peak Grad:      7.5 mmHg AV Mean Grad:      4.0 mmHg LVOT Vmax:         101.00 cm/s LVOT Vmean:        66.300 cm/s LVOT VTI:          0.204 m LVOT/AV VTI ratio: 0.73 AI PHT:            580 msec  AORTA Ao Root diam: 2.70 cm Ao Asc diam:  2.90 cm MITRAL VALVE                TRICUSPID VALVE MV Area (PHT): 3.77 cm  TR Peak grad:   31.8 mmHg MV Decel Time: 201 msec     TR Vmax:        282.00 cm/s MV E velocity: 62.20 cm/s MV A velocity: 106.00 cm/s  SHUNTS MV E/A ratio:  0.59         Systemic VTI:  0.20 m                             Systemic Diam: 1.80 cm Toribio Fuel MD Electronically signed by Toribio Fuel MD Signature Date/Time: 05/12/2024/6:10:50 PM    Final     Labs: CBC    Component Value Date/Time   WBC 18.3 (H) 05/30/2024 0823   RBC 4.44 05/30/2024 0823   HGB 12.6 05/30/2024 0823   HCT 39.9 05/30/2024 0823   PLT 168 05/30/2024 0823   MCV 89.9 05/30/2024 0823   MCH 28.4 05/30/2024 0823   MCHC 31.6 05/30/2024 0823   RDW 17.7 (H) 05/30/2024 0823   LYMPHSABS 0.6 (L) 05/30/2024 0823   MONOABS 1.5 (H) 05/30/2024 0823   EOSABS 0.0 05/30/2024 0823   BASOSABS 0.0 05/30/2024 0823    CMP     Component Value Date/Time   NA 134 (L) 06/06/2024 1213   NA 142 12/08/2018 0917   K 5.3 No hemolysis seen (H) 06/06/2024 1213   CL 101 06/06/2024 1213   CO2 28 06/06/2024 1213   GLUCOSE 189 (H) 06/06/2024 1213   BUN 37 (H) 06/06/2024 1213   BUN 18 12/08/2018 0917   CREATININE 0.97 06/06/2024 1213   CREATININE 1.12 (H) 05/04/2024 1315   CALCIUM  9.6 06/06/2024 1213   PROT 5.6 (L) 05/29/2024 0908   PROT 6.5 12/08/2018 0917   ALBUMIN 3.1 (L) 05/29/2024  0908   ALBUMIN 3.9 12/08/2018 0917   AST 25 05/29/2024 0908   ALT 45 (H) 05/29/2024 0908   ALKPHOS 35 (L) 05/29/2024 0908   BILITOT 1.0 05/29/2024 0908   BILITOT 0.4 12/08/2018 0917   GFRNONAA >60 05/30/2024 0823   GFRNONAA 66 02/26/2015 1540   GFRAA >60 10/04/2019 0216   GFRAA 77 02/26/2015 1540       Assessment and Plan:   KEYARI KLEEMAN is a 73 y.o. y/o female returns for follow-up of rectal bleeding.  Recently hospitalized for flareup of interstitial lung disease, bilateral pulmonary emboli, pneumonitis.  Currently on Eliquis .  During hospitalization she developed rectal bleeding.  Underwent colonoscopy by Dr. Albertus which showed 12 adenomatous polyps removed (size 3 mm to 10 mm) with no dysplasia.  She was also found to have anal fissure and internal hemorrhoids which was thought to be the source of her rectal bleeding.  1.  Anal fissure - Continue symptomatic treatment  2.  Internal hemorrhoids - Continue symptomatic treatment  3.  12 small adenomatous polyps removed on colonoscopy 05/28/2024.  No dysplasia. - Dr. Albertus recommended 3-year repeat colonoscopy (due 05/2027).  4.  Comorbidities: Interstitial lung disease, pulmonary emboli, currently on Eliquis , oxygen , prednisone , and CellCept . - Continue follow-up with pulmonology  Ellouise Console, PA-C  Follow up ***

## 2024-06-08 NOTE — Telephone Encounter (Signed)
 Thanks. She has an appt to see Ellouise Console tomorrow.

## 2024-06-08 NOTE — Telephone Encounter (Signed)
 Does patient absolutely need her blood drawn again? Please advise and I will give the patient a call, thanks!

## 2024-06-08 NOTE — Telephone Encounter (Signed)
 Yes, the labs are in Dr. Pamula in-box for review.

## 2024-06-08 NOTE — Telephone Encounter (Signed)
 Rhonda [name], did you receive a fax?

## 2024-06-08 NOTE — Telephone Encounter (Signed)
 Made Alethea Blocker, CMA aware the labs are in Dr. Pamula in-box for appt tomorrow.

## 2024-06-09 ENCOUNTER — Telehealth: Payer: Self-pay | Admitting: *Deleted

## 2024-06-09 ENCOUNTER — Other Ambulatory Visit (INDEPENDENT_AMBULATORY_CARE_PROVIDER_SITE_OTHER)

## 2024-06-09 ENCOUNTER — Ambulatory Visit: Admitting: Physician Assistant

## 2024-06-09 ENCOUNTER — Encounter: Payer: Self-pay | Admitting: Physician Assistant

## 2024-06-09 ENCOUNTER — Ambulatory Visit: Payer: Self-pay | Admitting: Physician Assistant

## 2024-06-09 ENCOUNTER — Telehealth: Payer: Self-pay | Admitting: Internal Medicine

## 2024-06-09 VITALS — BP 120/50 | HR 92 | Ht 59.0 in | Wt 140.5 lb

## 2024-06-09 DIAGNOSIS — Z860101 Personal history of adenomatous and serrated colon polyps: Secondary | ICD-10-CM

## 2024-06-09 DIAGNOSIS — K625 Hemorrhage of anus and rectum: Secondary | ICD-10-CM | POA: Diagnosis not present

## 2024-06-09 DIAGNOSIS — J9611 Chronic respiratory failure with hypoxia: Secondary | ICD-10-CM

## 2024-06-09 DIAGNOSIS — J849 Interstitial pulmonary disease, unspecified: Secondary | ICD-10-CM

## 2024-06-09 DIAGNOSIS — Z860100 Personal history of colon polyps, unspecified: Secondary | ICD-10-CM

## 2024-06-09 DIAGNOSIS — I2782 Chronic pulmonary embolism: Secondary | ICD-10-CM

## 2024-06-09 DIAGNOSIS — K648 Other hemorrhoids: Secondary | ICD-10-CM | POA: Diagnosis not present

## 2024-06-09 DIAGNOSIS — K602 Anal fissure, unspecified: Secondary | ICD-10-CM

## 2024-06-09 LAB — CBC
HCT: 39.2 % (ref 36.0–46.0)
Hemoglobin: 12.7 g/dL (ref 12.0–15.0)
MCHC: 32.4 g/dL (ref 30.0–36.0)
MCV: 87.2 fl (ref 78.0–100.0)
Platelets: 167 K/uL (ref 150.0–400.0)
RBC: 4.5 Mil/uL (ref 3.87–5.11)
RDW: 20 % — ABNORMAL HIGH (ref 11.5–15.5)
WBC: 19.2 K/uL (ref 4.0–10.5)

## 2024-06-09 MED ORDER — HYDROCORTISONE ACETATE 25 MG RE SUPP: 25.0000 mg | Freq: Every day | RECTAL | 1 refills | Status: AC

## 2024-06-09 NOTE — Patient Instructions (Signed)
 Your provider has requested that you go to the basement level for lab work before leaving today. Press B on the elevator. The lab is located at the first door on the left as you exit the elevator.  We have sent the following medications to your pharmacy for you to pick up at your convenience: Hydrocortisone  Suppositories 25mg   Please follow up sooner if symptoms increase or worsen  Due to recent changes in healthcare laws, you may see the results of your imaging and laboratory studies on MyChart before your provider has had a chance to review them.  We understand that in some cases there may be results that are confusing or concerning to you. Not all laboratory results come back in the same time frame and the provider may be waiting for multiple results in order to interpret others.  Please give us  48 hours in order for your provider to thoroughly review all the results before contacting the office for clarification of your results.   Thank you for trusting me with your gastrointestinal care!   Ellouise Console, PA-C _______________________________________________________  If your blood pressure at your visit was 140/90 or greater, please contact your primary care physician to follow up on this.  _______________________________________________________  If you are age 75 or older, your body mass index should be between 23-30. Your Body mass index is 28.38 kg/m. If this is out of the aforementioned range listed, please consider follow up with your Primary Care Provider.  If you are age 75 or younger, your body mass index should be between 19-25. Your Body mass index is 28.38 kg/m. If this is out of the aformentioned range listed, please consider follow up with your Primary Care Provider.   ________________________________________________________  The [place] [facility] providers would like to encourage you to use MYCHART to communicate with providers for non-urgent requests or questions.  Due to long  hold times on the telephone, sending your provider a message by [facility] may be a faster and more efficient way to get a response.  Please allow 48 business hours for a response.  Please remember that this is for non-urgent requests.  _______________________________________________________

## 2024-06-09 NOTE — Telephone Encounter (Signed)
 I called and left detailed msg on her machine that we ordered ABG and that someone will call her to schedule.

## 2024-06-09 NOTE — Telephone Encounter (Signed)
 Received incoming call from the lab on a critical WBC for this patient of 19.2. [place]

## 2024-06-09 NOTE — Telephone Encounter (Signed)
 [name] hen I was away got dizzy fell and hit her head. Her pcp Geronimo Amel, MD Is recommending ABG. I ordred one. Please check if I ordered correctly and do direct her  Thanks    SIGNATURE    Dr. Amel Geronimo, M.D., F.C.C.P,  Pulmonary and Critical Care Medicine Staff Physician, [facility] Center Director - Interstitial Lung Disease  Program  Pulmonary Fibrosis [facility] at [facility] [place], [place], [address]   Pager: [phone], If no answer  -> Check AMION or Try [phone] Telephone (clinical office): [phone] Telephone (research): [phone]  1:56 PM 06/09/2024

## 2024-06-11 ENCOUNTER — Encounter: Payer: Self-pay | Admitting: Cardiology

## 2024-06-12 ENCOUNTER — Ambulatory Visit (INDEPENDENT_AMBULATORY_CARE_PROVIDER_SITE_OTHER)

## 2024-06-12 ENCOUNTER — Other Ambulatory Visit: Payer: Self-pay | Admitting: Pharmacist

## 2024-06-12 VITALS — BP 125/62 | HR 69 | Temp 97.7°F | Resp 18 | Ht 59.0 in | Wt 143.8 lb

## 2024-06-12 DIAGNOSIS — J679 Hypersensitivity pneumonitis due to unspecified organic dust: Secondary | ICD-10-CM | POA: Diagnosis not present

## 2024-06-12 DIAGNOSIS — Z79899 Other long term (current) drug therapy: Secondary | ICD-10-CM | POA: Diagnosis not present

## 2024-06-12 DIAGNOSIS — I272 Pulmonary hypertension, unspecified: Secondary | ICD-10-CM | POA: Diagnosis not present

## 2024-06-12 DIAGNOSIS — J849 Interstitial pulmonary disease, unspecified: Secondary | ICD-10-CM

## 2024-06-12 MED ORDER — ALTEPLASE 2 MG IJ SOLR
2.0000 mg | Freq: Once | INTRAMUSCULAR | Status: DC | PRN
Start: 2024-06-12 — End: 2024-06-12

## 2024-06-12 MED ORDER — SODIUM CHLORIDE 0.9% FLUSH
3.0000 mL | Freq: Once | INTRAVENOUS | Status: DC | PRN
Start: 2024-06-12 — End: 2024-06-12

## 2024-06-12 MED ORDER — SODIUM CHLORIDE 0.9% FLUSH: 10.0000 mL | Freq: Once | INTRAVENOUS | Status: DC | PRN

## 2024-06-12 MED ORDER — ANTICOAGULANT SODIUM CITRATE 4% (200MG/5ML) IV SOLN: 5.0000 mL | Freq: Once | Status: DC | PRN

## 2024-06-12 MED ORDER — HEPARIN SOD (PORK) LOCK FLUSH 100 UNIT/ML IV SOLN: 250.0000 [IU] | Freq: Once | INTRAVENOUS | Status: DC | PRN

## 2024-06-12 MED ORDER — SODIUM CHLORIDE 0.9 % IV SOLN
1000.0000 mg | Freq: Once | INTRAVENOUS | Status: AC
  Administered 2024-06-12: 1000 mg via INTRAVENOUS
  Filled 2024-06-12: qty 100

## 2024-06-12 MED ORDER — ACETAMINOPHEN 325 MG PO TABS
650.0000 mg | ORAL_TABLET | Freq: Once | ORAL | Status: AC
  Administered 2024-06-12: 650 mg via ORAL
  Filled 2024-06-12: qty 2

## 2024-06-12 MED ORDER — DIPHENHYDRAMINE HCL 25 MG PO CAPS
50.0000 mg | ORAL_CAPSULE | Freq: Once | ORAL | Status: AC
Start: 2024-06-12 — End: 2024-06-12
  Administered 2024-06-12: 50 mg via ORAL
  Filled 2024-06-12: qty 2

## 2024-06-12 MED ORDER — HEPARIN SOD (PORK) LOCK FLUSH 100 UNIT/ML IV SOLN: 500.0000 [IU] | Freq: Once | INTRAVENOUS | Status: DC | PRN

## 2024-06-12 MED ORDER — METHYLPREDNISOLONE SODIUM SUCC 125 MG IJ SOLR
125.0000 mg | Freq: Once | INTRAMUSCULAR | Status: AC
  Administered 2024-06-12: 125 mg via INTRAVENOUS
  Filled 2024-06-12: qty 2

## 2024-06-12 NOTE — Telephone Encounter (Signed)
 She had multiple polyps including hot and cold snare polypectomy earlier this month In the setting of DOAC we need to watch closely because she could be having an intermittent post polypectomy bleed Hemorrhoidal bleeding also remains in the differential Would closely watch her hemoglobin, it appears primary care will check that today or tomorrow If it is dropping she may need to go back to the hospital It is okay to put her in a hemorrhoidal banding slot in my clinic and we can discuss banding at that time but we need to make sure this is not bleeding from the polypectomy site first

## 2024-06-12 NOTE — Progress Notes (Signed)
 Diagnosis: ILD  Provider:  Lonna Coder MD  Procedure: IV Infusion  IV Type: Peripheral, IV Location: L Antecubital  Ruxience  (rituximab -pvvr), Dose: 1000 mg  Infusion Start Time: 0917  Infusion Stop Time: 1258  Post Infusion IV Care: Peripheral IV Discontinued  Discharge: Condition: Good, Destination: Home . AVS Provided  Performed by:  Leita FORBES Miles, LPN

## 2024-06-12 NOTE — Telephone Encounter (Signed)
 Left message for patient to call back

## 2024-06-12 NOTE — Progress Notes (Signed)
 Therapy plan placed for Ruxience  [phone]) at [facility] [phone]) [place] to start benefits investigation   Provider: Dr. Praveen Mannam  Dose: 1000mg  at Day 0 and Day 14.   Premedications: acetaminophen  650mg  p.o., diphenhydramine  25mg  p.o., and Solumedrol 125mg  IV  Last infusion: 06/12/2024 Last Clinic Visit: 5/22/205 Next Clinic Visit: 06/14/2024  Pertinent Labs: CBC on 05/30/2024, TB gold negative on 02/07/2024; hepatitis negative on 02/03/2024  Sherry Pennant, PharmD, MPH, BCPS, CPP Clinical Pharmacist (Rheumatology and Pulmonology)

## 2024-06-12 NOTE — Addendum Note (Signed)
 Addended by: DAYNE SHERRY RAMAN on: 06/12/2024 02:58 PM   Modules accepted: Orders

## 2024-06-12 NOTE — Telephone Encounter (Signed)
 Message completed in Telephone Encounter dated 06/09/24. Nothing further needed at this time.

## 2024-06-13 ENCOUNTER — Encounter (HOSPITAL_COMMUNITY)

## 2024-06-13 NOTE — Progress Notes (Unsigned)
 OV 06/14/2024  Subjective:  Patient ID: Rhonda [name], female , DOB: 17-Jan-1951 , age 73 y.o. , MRN: [id number] , ADDRESS: [address] [place] [id number] PCP Burney Darice CROME, MD Patient Care Team: Burney Darice CROME, MD as PCP - General (Family Medicine) Geronimo Amel, MD as Consulting Physician (Pulmonary Disease) Edith, Debby BROCKS, MD (Cardiology) Wonda Cy BROCKS, RD as Dietitian (Family Medicine) Lavona Agent, MD as Consulting Physician (Cardiology)  This Provider for this visit: Treatment Team:  Attending Provider: Geronimo Amel, MD   2012 and 2013  #GE reflux with small hiatal hernia  - on ppi   #History of rapid heart rate not otherwise specified  - Start her on Lopressor   2008/2009 by primary care physician. History appears to correlate with onset of respiratory issues  - refuses to dc this drug as of 2014 discussion   # Hypersensitivity Pneumonitis and ILD  - Potential etiologies - cockateil x 2 for 18 years through end 2012. 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 Denies mold but lives in house built in [place] with a weird baselment: and has humidifier  - first noted on CT chest 10/15/09 following trip to [place] (PE negative) but not described in 2003 CT chest report  - autoimmune panel: 10/23/11: Negative  -  Uderwennt bronch 11/19/11  - non diagnostic  - VATS Nov 2013 (done after initially refusing)- CRONIC HYPERSENSITIVITY PNEUMONITIS WITH GRANULOMA. However, there is worrying trend of UIP pattern in the Upper lobes.     Follow-up chronic hypersensitive pneumonitis ; slow progressive phenotype -  On ILD-pro registry protocol - On chronic prednisone   - 5mg  per day but stince spring 2024 needing more brsts and settled on 20mg  per day as of Nov 2024, high-dose prednisone  in spring 2025 and tapered and flared up and then back on high-dose prednisone  in July 2025 after  pulsed dose steroid in the hospital - failed ofev  due to side efect  - failed esbriet  due to sde effects  March 2022   - declind lung tx referral 2023 , reconsider this in the spring 2025  - declined cellcept  May 2024  - Started CellCept  March 2025 and then there was a pause  - Started Rituxan  July 2025   - Last high-res CT Nov 222 -> mrach 2024: no change    -Right heart cath -March 2025 with normal pulmonary pressure mean PA 19 but concern for right-to-left shunt   -Started CellCept  March 2025 because of concern of progressive ILD  - Hospitalized 03/14/2024 -03/17/2024 because of acute on chronic hypoxemic respiratory flare requiring steroids.  Respiratory virus panel positive for rhinovirus  - - Follow-up in clinic 4/ 28/25 with persistent respiratory failure and decompensation clinically: Empirically treated with 3 weeks Bactrim  for empiric PJP [LDH high]  - During this time CellCept  on hold and then restarted  - Restarted Bactrim  for PJP prophylaxis after this  - Hospitalized again 05/22/2024-7 8/25 with acute worsening of respiratory failure associated with steroid taper.  Status post small bilateral pulmonary embolism diagnosis and started on anticoagulation.  Negative bronchoscopy for organisms.  Restarted on high-dose steroid pulse dose and commented to Rituxan  cycle 1 day 0 05/25/24 and day 15 (on 06/12/2024]  - Course complicated by rectal bleeding and showed polyps anal fissure versus hemorrhoid.  -Blurred vision with high-dose steroids     06/14/2024 -   Chief Complaint  Patient presents  with   Medical Management of Chronic Issues   Interstitial Lung Disease    Breathing has been slightly worse since last visit here. She is not coughing. She has not been using o2 much unless heavy exertion.      HPI Rhonda [name] 73 y.o. -presents for follow-up.  Presents with her husband [name].  This is posthospital follow-up.  Number of issues.- Hospitalized again 05/22/2024-7 8/25  with acute worsening of respiratory failure associated with steroid taper.  Status post small bilateral pulmonary embolism diagnosis and started on anticoagulation.  Negative bronchoscopy for organisms.  Restarted on high-dose steroid pulse dose and commented to Rituxan  cycle 1 day 0 05/25/24 and day 15 (on 06/12/2024]  - Course complicated by rectal bleeding and showed polyps anal fissure versus hemorrhoid.  -Blurred vision with high-dose steroids  At this point in time in terms of  #Interstitial lung disease: Basically over the years she seems to be requiring more and more dose of prednisone  to prevent her from having hypoxemic exacerbations.  It used to be 5 mg and then it was 10 mg and then 20 mg and then now anywhere from 40 to 60 mg.  With each taper she gets exacerbations.  With started on CellCept  in the spring 2025 but this got interrupted because of viral exacerbation.  Then we treated empirically with Bactrim  for PJP.  Then in early July 2025 she had respiratory worsening again diagnosed with a small pulmonary embolism but hypoxemia is felt to be out of proportion.  She was seen by Dr. Theophilus in the hospital.  Treated with pulsed dose steroid and also is committed to IV Rituxan .  This is since helped her hypoxemia.  Today she was able to do a sit/stand test without desaturation.  But she only did 5 times.  At home she is no longer hypoxemic room air at rest.  In the hospital she was on significant amount of oxygen .  At this point in time she is triple immunosuppressed with prednisone  60 mg, status post Rituxan  cycle 1 and also CellCept .  She is tolerating these medicines okay except for various other symptoms which might be medication side effect [see below].  #Small PE continues on Eliquis   #Rectal bleeding in the hospital she had rectal bleeding.  She is on Eliquis  with the thought process was this was related to hemorrhoids she had colonoscopies for polyps.  She is going to have anal  banding  #Dizziness: This was supposed to even before the admission.  Post discharge she fell and went to the ER head bleed was ruled out.  She states that the steroid has been lowered this is slightly better.  #Blurring of the vision this is significantly present with high-dose of steroids which is required for the lungs.  She wants to see an endocrinologist have referred her to Dr. Tawni Marking.  Dr. Darice Henle her primary care doctor is also recommending this.  #Pedal edema: She developed new onset pedal edema after discharge when I gave her Lasix  for a week it resolved but it is coming back again she is been off Lasix  for 4 days.  Will check a BNP.  Will put her on permanent Lasix .        SYMPTOM SCALE - ILD Sept 2020 01/30/2020  08/29/2020  01/14/2021 146# 07/08/2021 147# - off esbriet . Sic past week 08/14/2021 149# - pred only. No antifibrotic 10/03/2021 148# - pred 5mg  07/21/2022 137# 10/27/2022  04/13/2023 20mg  pered - 134# 06/08/2023 135# ,  pred 20mg  10/06/2023 138# 04/13/2024  06/14/2024 Pred 60  Rituxan  July 2025  Cellcept   bactrim   O2 use  o2 with ex o2 withe ex 2L     2LL o2 with ex 2L with ex 2 laps on RA       Shortness of Breath  0 -> 5 scale with 5 being worst (score 6 If unable to do)              At rest 0 0 1 0 1 0 1 0 1 1  0 0 1 1  Simple tasks - showers, clothes change, eating, shaving 01 1 1 1.5 2 1 2 1 2 1 1 1 1 2   Household (dishes, doing bed, laundry) 0 3 3 3 4 2  3.5 2 3 2 2 2 3 2   Shopping 1 1 1 2 2 1 1 1 2 1 2 1 1 3   Walking level at own pace 1 2 2 1 2 2 3 1 2 2 1 3 1 2   Walking up Stairs 2 3 3 3 4 3 4 3 4 3 4  2.5 3 5   Total (30-36) Dyspnea Score 5 10 11  10.5 15 9  14.5 8 14 10 10 9 10 15   How bad is your cough? 1 3 2 2 2 2 2 2 3  0 2 1 1  0  How bad is your fatigue 1 2 2 3 2  0 3 1 2 2 4 2 5 3   How bad is nausea  0 0 3 0 0 1 0 0 0 0  0 0 0  How bad is vomiting?   0 0 0 0 0 1 0 0 0 0  0 0 0  How bad is diarrhea?  00 1 0 0 0 1 0 0 0 1  0 0 0  How bad is  anxiety?  0 0 0 00 0 2 0 00 0 0  0 0 1  How bad is depression  0 2 1 1 0 1 0 0 0 0  0 0 2  00 0 0 Simple office walk 185 feet x  3 laps goal with forehead probe 04/13/2018  08/16/2018  12/15/2018  05/09/2019  08/22/2019  01/30/2020  08/29/2020  01/14/2021  07/08/2021  08/14/2021  10/03/2021  07/21/2022  10/27/2022  04/13/2023  06/08/2023  10/06/2023  04/13/2024  06/14/2024   O2 used Room air Room air Room air Room air Room air Room air ra ra ra ra ra ra ra ra Ra sit stand ra    Number laps completed 3 3 3 3 2  stopped at 2 die to hip pain 3 laps - no hip pain following hip surgery 3 3 3  attempted byt did  only 2 3 bu stopped at 2 3 and did all 3 3 Ddi onl 2 las Sit/stand x 10 times X 10 Sit stand x 15    Comments about pace good Moderate pace Normal, hip bothering     avg pace avg  avg avg avg Good pace      Resting Pulse Ox/HR 98% and 73/min 98% and HR 77/min 99% and HR 61/min 98% and HR 70/min 98% 98% and 75/min 97% and 67/mi 94% and 80/min 96% and HR 72 98% and 71 100% and HR 82 98% ad HR 72 100% and HR 79 97% and heart rate 74 93% and HR 83 98% and HR 91    Final Pulse Ox/HR 91% and 91/min 93% and 92.min 94% and 92/min 93% and 98/min 91%  89% and 93/,imn 88% and 94 89% ad 88.nin 88% at 2nd  lap end HR 92 86% and 96 89% and HR 91 93% and HR 87 84% and HR 104 94% and heart rate 92 87% and HR 90 92% and HR 105 Walked 1 lap on room air went down to 90%. Did sit stand x 5 and did well statyed 97%  Desaturated </= 88% no no no no   yes almost yes yes almst no yes no yes     Desaturated <= 3% points yes yes Yes, 5 points Yes, 5 points    Yes, 5 points Yes, 8 points Yes, 12 pints Yes 11 pot 5 pots 16 pont Yes, 3 poi yes     Got Tachycardic >/= 90/min yes yes yes yes     yes yes yes no        Symptoms at end of test none none Hip pain and very mild dyspnea  Stopped due to hip pain Moderate duyspnea with mask Mild to moderate dyspnea  Severe dyspnea Mild dyspnea Severe dyspnea Very mild dyspneax moderate No  dyspnea Mild dyspnea     Miscellaneous comments x x    No hip pain    Similar to lastime Symptoms out of proportion ? better worse           PFT     Latest Ref Rng & Units 06/14/2024    8:19 AM 04/05/2024    8:21 AM 10/01/2023    9:49 AM 06/04/2023   10:12 AM 03/08/2023   10:49 AM 12/09/2022   11:48 AM 07/21/2022    9:50 AM  PFT Results  FVC-Pre L 1.48  P 1.52  1.43  1.44  1.45  1.53  1.54   FVC-Predicted Pre % 63  P 64  60  60  64  66  67   Pre FEV1/FVC % % 89  P 87  92  91  93  87  91   FEV1-Pre L 1.32  P 1.33  1.31  1.30  1.35  1.32  1.40   FEV1-Predicted Pre % 75  P 76  73  73  80  77  81   DLCO uncorrected ml/min/mmHg 7.71  P 8.29  9.73  10.82  8.11  9.64  8.16   DLCO UNC% % 46  P 49  58  64  50  59  50   DLCO corrected ml/min/mmHg 7.89  P 8.24  9.73  10.82  7.81  9.41  7.86   DLCO COR %Predicted % 47  P 49  58  64  48  58  48   DLVA Predicted % 76  P 78  81  85  65  81  64   TLC L     3.02     TLC % Predicted %     70     RV % Predicted %     70       P Preliminary result       LAB RESULTS last 96 hours No results found.       has a past medical history of Allergy, Arthritis, Asthma, Cataract, Coronary artery calcification seen on CAT scan (08/19/2017), DDD (degenerative disc disease), thoracic, Depression, DOE (dyspnea on exertion), Fatty liver, GERD (gastroesophageal reflux disease), H/O steroid therapy, Heart palpitations (02/28/2015), Helicobacter pylori ab+, Hemorrhoids, Hiatal hernia, High cholesterol, History of chronic bronchitis, History of migraine, History of MRSA infection, Hyperlipidemia, mixed (08/19/2017), Hyperplastic colon polyp (  2007), IBS (irritable bowel syndrome), Inguinal hernia, Insulin  resistance, Interstitial lung disease (HCC), MVP (mitral valve prolapse), Pneumonia, Pneumonitis, hypersensitivity (HCC), PONV (postoperative nausea and vomiting), Pre-diabetes, Pulmonary fibrosis (HCC), PVC (premature ventricular contraction) (08/19/2017), Rapid  heart rate, Squamous cell carcinoma of skin, Tinnitus, right ear, and Vocal cord ulcer.   reports that she has never smoked. She has never used smokeless tobacco.  Past Surgical History:  Procedure Laterality Date   61 HOUR PH STUDY N/A 02/21/2018   Procedure: 24 HOUR PH STUDY;  Surgeon: Shila Gustav GAILS, MD;  Location: [facility];  Service: Endoscopy;  Laterality: N/A;   BREAST BIOPSY Right 2009   BIOPSY, pt denies   BREAST BIOPSY Left 2003   Benign    CATARACT EXTRACTION Left    CESAREAN SECTION     COLONOSCOPY     COLONOSCOPY N/A 05/28/2024   Procedure: COLONOSCOPY;  Surgeon: Albertus Gordy HERO, MD;  Location: [facility];  Service: Gastroenterology;  Laterality: N/A;   ESOPHAGEAL MANOMETRY N/A 02/21/2018   Procedure: ESOPHAGEAL MANOMETRY (EM);  Surgeon: Shila Gustav GAILS, MD;  Location: [facility];  Service: Endoscopy;  Laterality: N/A;   FOOT FRACTURE SURGERY  2006 or 2007   right   HYMENECTOMY     LUNG BIOPSY  09/28/2012   Procedure: LUNG BIOPSY;  Surgeon: Elspeth JAYSON Millers, MD;  Location: [facility] OR;  Service: Thoracic;  Laterality: N/A;  lung biopsies tims three   RIGHT HEART CATH N/A 04/04/2024   Procedure: RIGHT HEART CATH;  Surgeon: Rolan Ezra RAMAN, MD;  Location: [facility] INVASIVE CV LAB;  Service: Cardiovascular;  Laterality: N/A;   SQUAMOUS CELL CARCINOMA EXCISION Left    left arm   TOTAL HIP ARTHROPLASTY Right 09/10/2015   Procedure: RIGHT TOTAL HIP ARTHROPLASTY ANTERIOR APPROACH;  Surgeon: Donnice Car, MD;  Location: [facility];  Service: Orthopedics;  Laterality: Right;   TOTAL HIP ARTHROPLASTY Left 10/03/2019   Procedure: TOTAL HIP ARTHROPLASTY ANTERIOR APPROACH;  Surgeon: Car Donnice, MD;  Location: [facility];  Service: Orthopedics;  Laterality: Left;  70 mins   TUBAL LIGATION     UPPER GI ENDOSCOPY     VIDEO ASSISTED THORACOSCOPY  09/28/2012   Procedure: VIDEO ASSISTED THORACOSCOPY;  Surgeon: Elspeth JAYSON Millers, MD;  Location: [facility] OR;  Service: Thoracic;  Laterality: Right;    VIDEO BRONCHOSCOPY  11/19/2011   Procedure: VIDEO BRONCHOSCOPY WITH FLUORO;  Surgeon: Dorethia Cave, MD;  Location: [facility] ENDOSCOPY;  Service: Endoscopy;;   VIDEO BRONCHOSCOPY Bilateral 05/23/2024   Procedure: VIDEO BRONCHOSCOPY WITHOUT FLUORO;  Surgeon: Mannam, Praveen, MD;  Location: [facility];  Service: Cardiopulmonary;  Laterality: Bilateral;    Allergies  Allergen Reactions   Ozempic (0.25 Or 0.5 Mg-Dose) [Semaglutide(0.25 Or 0.5mg -Dos)] Other (See Comments)    Paralyzed the stomach   Tape Other (See Comments)    SKIN IS VERY FRAGILE!! Burns and pulls up the skin!! SKIN BRUISES and TEARS EASILY.SABRA   Betadine [Povidone Iodine] Other (See Comments)    Blisters    Codeine Nausea And Vomiting   Garlic Diarrhea   Hydrocodone  Nausea And Vomiting   Iodine Other (See Comments)    Blisters    Macrobid [Nitrofurantoin] Other (See Comments)    Instructed by dr not to take    Ofev  [Nintedanib] Other (See Comments)    Abdominal pain   Onion Diarrhea   Shellfish Allergy Nausea And Vomiting   Statins Other (See Comments)    Leg pain, but Pravachol  is tolerated   Sulfa  Antibiotics Other (See Comments)  Headaches     Immunization History  Administered Date(s) Administered   Fluad Quad(high Dose 65+) 07/17/2019   Hepatitis A 05/23/2010   Hepatitis B 06/23/2006   Influenza Split 08/11/2012, 08/14/2017   Influenza Whole 09/08/2011   Influenza, High Dose Seasonal PF 08/24/2016, 07/24/2017, 08/31/2018, 08/15/2020, 09/26/2020, 09/12/2021, 08/24/2022   Influenza,inj,Quad PF,6+ Mos 09/07/2013   Influenza-Unspecified 09/26/2014, 07/27/2015, 08/24/2023   MMR 05/23/2010   Moderna Sars-Covid-2 Vaccination 05/07/2021, 07/30/2021, 01/01/2022   PFIZER(Purple Top)SARS-COV-2 Vaccination 12/16/2019, 01/08/2020, 07/18/2020, 08/11/2022   Pfizer(Comirnaty)Fall Seasonal Vaccine 12 years and older 07/24/2023   Pneumococcal Conjugate-13 03/15/2014   Pneumococcal Polysaccharide-23 01/11/2018,  09/26/2020, 08/26/2021   Respiratory Syncytial Virus Vaccine,Recomb Aduvanted(Arexvy) 07/28/2022   Td 02/22/2003   Tdap 10/28/2012   Zoster Recombinant(Shingrix) 02/15/2017, 05/17/2017   Zoster, Live 12/04/2010    Family History  Problem Relation Age of Onset   Asthma Mother    Osteoarthritis Mother    Dementia Mother 78   Lymphoma Father    Diabetes Father    Hypertension Sister    Heart disease Sister    Kidney disease Sister    Allergic rhinitis Daughter    Ovarian cancer Maternal Aunt    Breast cancer Paternal Aunt    Breast cancer Paternal Aunt    Emphysema Maternal Grandmother    Asthma Maternal Grandmother    Lung disease Maternal Grandfather    Bone cancer Paternal Grandfather    Breast cancer Cousin    Breast cancer Cousin    Breast cancer Cousin    Colon cancer Neg Hx    Angioedema Neg Hx    Eczema Neg Hx    Immunodeficiency Neg Hx    Urticaria Neg Hx      Current Outpatient Medications:    albuterol  (PROAIR  HFA) 108 (90 Base) MCG/ACT inhaler, Inhale 2 puffs into the lungs every 4 (four) hours as needed for wheezing. Or coughing spells.  You may use 2 Puffs 5-10 minutes before exercise. (Patient taking differently: Inhale 2 puffs into the lungs every 4 (four) hours as needed for wheezing (or coughing. May also use 2 puffs 5-10 minutes before exercise.).), Disp: 1 each, Rfl: 3   albuterol  (PROVENTIL ) (2.5 MG/3ML) 0.083% nebulizer solution, Take 3 mLs (2.5 mg total) by nebulization every 6 (six) hours as needed for wheezing or shortness of breath., Disp: 360 mL, Rfl: 0   Alpha-D-Galactosidase (BEANO PO), Take 1 tablet by mouth as needed (as directed, if eating onion or garlic)., Disp: , Rfl:    ALPRAZolam  (XANAX ) 0.25 MG tablet, Take 0.25 mg by mouth at bedtime as needed for anxiety or sleep., Disp: , Rfl:    AMBULATORY NON FORMULARY MEDICATION, See admin instructions. Allergy injections on Mondays and hold when sick, Disp: , Rfl:    apixaban  (ELIQUIS ) 5 MG TABS  tablet, Take 1 tablet (5 mg total) by mouth 2 (two) times daily., Disp: 60 tablet, Rfl: 0   arformoterol  (BROVANA ) 15 MCG/2ML NEBU, Take 2 mLs (15 mcg total) by nebulization 2 (two) times daily., Disp: 120 mL, Rfl: 0   ascorbic acid (VITAMIN C) 500 MG tablet, Take 500 mg by mouth daily., Disp: , Rfl:    ASHWAGANDHA PO, Take 1 capsule by mouth every other day., Disp: , Rfl:    azelastine (ASTELIN) 0.1 % nasal spray, Place 1 spray into both nostrils in the morning. Use in each nostril as directed, Disp: , Rfl:    budesonide  (PULMICORT ) 0.5 MG/2ML nebulizer solution, Take 2 mLs (0.5 mg total) by nebulization in  the morning and at bedtime., Disp: 120 mL, Rfl: 0   Calcium  Carb-Cholecalciferol (CALCIUM  500 + D PO), Take 1 tablet by mouth daily., Disp: , Rfl:    chlorpheniramine (CHLOR-TRIMETON) 4 MG tablet, Take 2 mg by mouth 2 (two) times daily as needed for allergies or rhinitis., Disp: , Rfl:    cholecalciferol (VITAMIN D3) 25 MCG (1000 UNIT) tablet, Take 1,000 Units by mouth every other day., Disp: , Rfl:    Continuous Glucose Sensor (DEXCOM G7 SENSOR) MISC, Check blood sugar fasting and before meals and at bedtime., Disp: 4 each, Rfl: 0   dicyclomine  (BENTYL ) 10 MG capsule, Take 10 mg by mouth 2 (two) times daily as needed for spasms., Disp: , Rfl:    diltiazem  (CARDIZEM  CD) 120 MG 24 hr capsule, Take 1 capsule (120 mg total) by mouth daily., Disp: 90 capsule, Rfl: 3   EPINEPHrine  0.3 mg/0.3 mL IJ SOAJ injection, Inject 0.3 mg into the muscle as needed for anaphylaxis., Disp: , Rfl:    ezetimibe  (ZETIA ) 10 MG tablet, Take 1 tablet (10 mg total) by mouth daily. (Patient taking differently: Take 10 mg by mouth at bedtime.), Disp: 90 tablet, Rfl: 3   fluticasone  (FLONASE ) 50 MCG/ACT nasal spray, Spray 2 sprays into each nostril every day. (Patient taking differently: Place 2 sprays into both nostrils in the morning.), Disp: 48 g, Rfl: 12   Furosemide  (LASIX  PO), Take 1 tablet by mouth daily., Disp: , Rfl:     furosemide  (LASIX ) 20 MG tablet, Take 1 tablet (20 mg total) by mouth daily., Disp: 30 tablet, Rfl: 2   HUMALOG  KWIKPEN 100 UNIT/ML KwikPen, Inject 8-10 Units into the skin See admin instructions. Inject 10 units into the skin before breakfast & lunch and 8 before supper, Disp: , Rfl:    hydrocortisone  (ANUSOL -HC) 25 MG suppository, Place 1 suppository (25 mg total) rectally at bedtime for 24 days., Disp: 12 suppository, Rfl: 1   Hypromellose (NATURES TEARS OP), Place 1 drop into both eyes in the morning and at bedtime., Disp: , Rfl:    levocetirizine (XYZAL ) 5 MG tablet, Take 5 mg by mouth at bedtime., Disp: , Rfl:    metFORMIN  (GLUCOPHAGE -XR) 500 MG 24 hr tablet, Take 500 mg by mouth daily with breakfast., Disp: , Rfl:    metoprolol  succinate (TOPROL -XL) 25 MG 24 hr tablet, Take 1 tablet (25 mg total) by mouth daily., Disp: 90 tablet, Rfl: 3   montelukast  (SINGULAIR ) 10 MG tablet, TAKE 1 TABLET BY MOUTH EVERYDAY AT BEDTIME (Patient taking differently: Take 10 mg by mouth at bedtime.), Disp: 90 tablet, Rfl: 2   Multiple Vitamin (MULTIVITAMIN) tablet, Take 1 tablet by mouth daily with breakfast., Disp: , Rfl:    mycophenolate  (CELLCEPT ) 500 MG tablet, Take 2 tablets (1,000 mg total) by mouth 2 (two) times daily., Disp: 120 tablet, Rfl: 0   nitroGLYCERIN  (NITROGLYN) 2 % ointment, Apply 0.25 inches topically 2 (two) times daily. Apply To anus and surrounding tissue. Use pea size amount, Disp: 30 g, Rfl: 0   nitroGLYCERIN  (NITROSTAT ) 0.4 MG SL tablet, Place 1 tablet (0.4 mg total) under the tongue every 5 (five) minutes as needed for chest pain., Disp: 75 tablet, Rfl: 3   nystatin  cream (MYCOSTATIN ), Apply 1 Application topically 2 (two) times daily as needed (for irritation- affected sites)., Disp: , Rfl:    omeprazole  (PRILOSEC  OTC) 20 MG tablet, Take 10 mg by mouth See admin instructions. Take 10 mg by mouth at 4:30 AM, Disp: , Rfl:  OXYGEN , Inhale 4-5 L/min into the lungs See admin instructions.  Inhale 4-5 L/min at bedtime and as needed for shortness of breath during the day, Disp: , Rfl:    potassium chloride  (KLOR-CON  10) 10 MEQ tablet, Take 1 tablet (10 mEq total) by mouth daily., Disp: 30 tablet, Rfl: 2   pravastatin  (PRAVACHOL ) 40 MG tablet, Take 1 tablet (40 mg total) by mouth every evening. (Patient taking differently: Take 40 mg by mouth at bedtime.), Disp: 90 tablet, Rfl: 1   predniSONE  (DELTASONE ) 10 MG tablet, 50 mg daily  x 2 weeks and then 40 mg/day and stay there till further instructions, Disp: 100 tablet, Rfl: 1   predniSONE  (DELTASONE ) 20 MG tablet, Take 3 tablets (60 mg total) by mouth daily with breakfast., Disp: 90 tablet, Rfl: 0   PREPARATION H 0.25-50 % GEL, Place 1 application  rectally 2 (two) times daily as needed (for hemorrhoids)., Disp: , Rfl:    revefenacin  (YUPELRI ) 175 MCG/3ML nebulizer solution, Inhale 1 vial by nebulization daily., Disp: 90 mL, Rfl: 0   riTUXimab -pvvr (RUXIENCE  IV), Inject into the vein. Infuse 1000mg  at Day 0 and Day 14. Repeat cycle every 6 months, Disp: , Rfl:    sodium chloride  (OCEAN) 0.65 % SOLN nasal spray, Place 1 spray into both nostrils as needed for congestion., Disp: , Rfl:    Sodium Chloride , Inhalant, 7 % NEBU, Use per nebulizer once a day to help clear sputum, Disp: 120 mL, Rfl: 3   sulfamethoxazole -trimethoprim  (BACTRIM  DS) 800-160 MG tablet, Take 1 tablet by mouth every Monday, Wednesday, and Friday., Disp: , Rfl:    Insulin  Disposable Pump (OMNIPOD 5 G7 PODS, GEN 5,) MISC, Inject 1 Device into the skin every 3 (three) days., Disp: , Rfl:       Objective:   Vitals:   06/14/24 0901 06/14/24 0902  BP: 122/62   Pulse: 72   SpO2: 97%   Weight:  147 lb (66.7 kg)  Height:  5' (1.524 m)    Estimated body mass index is 28.71 kg/m as calculated from the following:   Height as of this encounter: 5' (1.524 m).   Weight as of this encounter: 147 lb (66.7 kg).  @WEIGHTCHANGE @  [employer]   06/14/24 0902  Weight: 147  lb (66.7 kg)     Physical Exam   General: No distress. Looks cushingoind O2 at rest: no Cane present: no Sitting in wheel chair: has WALKER Frail: eyes Obese: has gained weigh Neuro: Alert and Oriented x 3. GCS 15. Speech normal Psych: Pleasant Resp:  Barrel Chest - no.  Wheeze - no, Crackles - yes but no weheezie and impved, No overt respiratory distress CVS: Normal heart sounds. Murmurs - no Ext: Stigmata of Connective Tissue Disease - no. HAS EDEMA HEENT: Normal upper airway. PEERL +. No post nasal drip        Assessment/PLAN     Assessment & Plan Hospital discharge follow-up  Hypersensitivity pneumonitis due to bird exposure, ? oil pain and ? mold in house  ILD (interstitial lung disease) (HCC)  Chronic respiratory failure with hypoxia (HCC)  High risk medication use  Visit for monitoring Rituxan  therapy  Encounter for long term use of mycophenolate  mofetil  Current chronic use of systemic steroids  Edema, unspecified type  Physical deconditioning  Rectal bleeding  History of pulmonary embolism  Steroid-induced hyperglycemia  Dizziness   Patient Instructions     ICD-10-CM   1. Hospital discharge follow-up  Z09  2. 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 oil pain and ? mold in house  J67.9     3. ILD (interstitial lung disease) (HCC)  J84.9     4. Chronic respiratory failure with hypoxia (HCC)  J96.11     5. High risk medication use  Z79.899     6. Visit for monitoring Rituxan  therapy  Z51.81    Z79.620     7. Encounter for long term use of mycophenolate  mofetil  Z79.624     8. Current chronic use of systemic steroids  Z79.52     9. Edema, unspecified type  R60.9     10. Physical deconditioning  R53.81     11. Rectal bleeding  K62.5     12. History of pulmonary embolism  Z86.711     13. Steroid-induced hyperglycemia  R73.9    T38.0X5A     14. Dizziness  [facility] Discharge Followuop Hypersensitivity  pneumonitis due to bird exposure, ? oil pain and ? mold in house ILD (interstitial lung disease) (HCC) Chronic respiratory failure with hypoxia (HCC) High risk medication use Visit for monitoring Rituxan  therapy Encounter for long term use of mycophenolate  mofetil Current chronic use of systemic steroids  Glad you are improved.  You are able to do a sit/stand test x 5 without desaturation Goal is to at least hold your lung disease at the current level with much less prednisone  Prednisone  while helping is causing significant side effects We will have to accept triple immunosuppression at this point to help protect your lungs  Plan   - Reduce prednisone  to 50 mg/day x 2 weeks and then go to prednisone  40 mg/day and stay there till further instructions - Prefer to reduce prednisone  less than 40 mg/day once you are 4-8 weeks into Rituxan  therapy from May 25, 2024 - Continue Rituxan  every 6 months [next dose would be in January 2026] - Continue CellCept  1000 mg twice daily - Continue Bactrim  for PJP prophylaxis - Check blood work CBC chemistry and d liver function  Edema, unspecified type -could be related to prednisone  or CellCept  or diastolic dysfunction Onset was posthospitalization  Plan  - Restart Lasix  20 mg/day with 10 mill equivalents of potassium - check bNP 06/14/2024    Physical deconditioning  -Improved  Plan  - Continue with physical therapy and other exercise programs  Dizzineess   ? \sterid related  Plan  - per PCP Burney Darice CROME, MD   Rectal bleeding -onset early July 2025 in the hospital  Plan  - per GI; support procedure  History of pulmonary embolism- small July 2025  Plan  - continue eliquis   0 if bleediong severe have to stop or lower dose  Steroid INduced High sugar  Plan  - refer Dr Tawni Hidalgo   [facility] - 4 weeks with Candis Baptise or Tammy Paret or Katie Cobb  - 8 weeks Tevion Laforge - 30 min     FOLLOWUP    Return for 4  weeks with APP and 8 weeks Olene Godfrey - both 30 min.    SIGNATURE    Dr. Dorethia Cave, M.D., F.C.C.P,  Pulmonary and Critical Care Medicine Staff Physician, [facility] Center Director - Interstitial Lung Disease  Program  Pulmonary Fibrosis [facility] [place], [place], [address]  Pager: [phone], If no answer or between  15:00h - 7:00h: call [phone] Telephone: [phone]  6:58 PM 06/14/2024

## 2024-06-13 NOTE — Telephone Encounter (Signed)
 Spoke to patient, she states that she is having intermittent bleeding and has been wearing a pad just because she is worried she will have bleeding onto clothing. However, blood is not soaking the pad, no blood clots, no large amounts brb.   Patient is advised that she should have hemoglobin monitored closely (as she already is by PCP and pulmonary) and if drop in hemoglobin along with bleeding over a couple day period, she should go to the emergency room for acute bleed. Patient states she has not had this thus far.   She is advised that she may use hydrocortisone  suppositories once nightly x 2 weeks, then off x 2 weeks, nightly again x 2 weeks etc. Until bleeding is better controlled. She states she is already doing this.  Patient has scheduled a hemorrhoidal banding appointment 07/20/24 with Dr Albertus.

## 2024-06-14 ENCOUNTER — Ambulatory Visit: Admitting: Internal Medicine

## 2024-06-14 ENCOUNTER — Other Ambulatory Visit: Payer: Self-pay | Admitting: Cardiology

## 2024-06-14 ENCOUNTER — Telehealth: Payer: Self-pay

## 2024-06-14 ENCOUNTER — Encounter: Payer: Self-pay | Admitting: Internal Medicine

## 2024-06-14 ENCOUNTER — Other Ambulatory Visit (INDEPENDENT_AMBULATORY_CARE_PROVIDER_SITE_OTHER)

## 2024-06-14 VITALS — BP 122/62 | HR 72 | Ht 60.0 in | Wt 147.0 lb

## 2024-06-14 DIAGNOSIS — Z7952 Long term (current) use of systemic steroids: Secondary | ICD-10-CM

## 2024-06-14 DIAGNOSIS — Z79620 Long term (current) use of immunosuppressive biologic: Secondary | ICD-10-CM

## 2024-06-14 DIAGNOSIS — R5381 Other malaise: Secondary | ICD-10-CM

## 2024-06-14 DIAGNOSIS — Z86711 Personal history of pulmonary embolism: Secondary | ICD-10-CM | POA: Diagnosis not present

## 2024-06-14 DIAGNOSIS — R42 Dizziness and giddiness: Secondary | ICD-10-CM

## 2024-06-14 DIAGNOSIS — Z09 Encounter for follow-up examination after completed treatment for conditions other than malignant neoplasm: Secondary | ICD-10-CM | POA: Diagnosis not present

## 2024-06-14 DIAGNOSIS — K625 Hemorrhage of anus and rectum: Secondary | ICD-10-CM

## 2024-06-14 DIAGNOSIS — T380X5A Adverse effect of glucocorticoids and synthetic analogues, initial encounter: Secondary | ICD-10-CM

## 2024-06-14 DIAGNOSIS — J849 Interstitial pulmonary disease, unspecified: Secondary | ICD-10-CM

## 2024-06-14 DIAGNOSIS — R609 Edema, unspecified: Secondary | ICD-10-CM

## 2024-06-14 DIAGNOSIS — J9611 Chronic respiratory failure with hypoxia: Secondary | ICD-10-CM

## 2024-06-14 DIAGNOSIS — Z79624 Long term (current) use of inhibitors of nucleotide synthesis: Secondary | ICD-10-CM | POA: Diagnosis not present

## 2024-06-14 DIAGNOSIS — Z79899 Other long term (current) drug therapy: Secondary | ICD-10-CM | POA: Diagnosis not present

## 2024-06-14 DIAGNOSIS — D849 Immunodeficiency, unspecified: Secondary | ICD-10-CM

## 2024-06-14 DIAGNOSIS — J679 Hypersensitivity pneumonitis due to unspecified organic dust: Secondary | ICD-10-CM

## 2024-06-14 DIAGNOSIS — Z5181 Encounter for therapeutic drug level monitoring: Secondary | ICD-10-CM

## 2024-06-14 DIAGNOSIS — Z8709 Personal history of other diseases of the respiratory system: Secondary | ICD-10-CM

## 2024-06-14 DIAGNOSIS — B348 Other viral infections of unspecified site: Secondary | ICD-10-CM

## 2024-06-14 DIAGNOSIS — R739 Hyperglycemia, unspecified: Secondary | ICD-10-CM

## 2024-06-14 LAB — CBC WITH DIFFERENTIAL/PLATELET
Basophils Absolute: 0 K/uL (ref 0.0–0.1)
Basophils Relative: 0.1 % (ref 0.0–3.0)
Eosinophils Absolute: 0 K/uL (ref 0.0–0.7)
Eosinophils Relative: 0.2 % (ref 0.0–5.0)
HCT: 37.8 % (ref 36.0–46.0)
Hemoglobin: 12.4 g/dL (ref 12.0–15.0)
Lymphocytes Relative: 4.5 % — ABNORMAL LOW (ref 12.0–46.0)
Lymphs Abs: 0.5 K/uL — ABNORMAL LOW (ref 0.7–4.0)
MCHC: 32.9 g/dL (ref 30.0–36.0)
MCV: 88.5 fl (ref 78.0–100.0)
Monocytes Absolute: 0.4 K/uL (ref 0.1–1.0)
Monocytes Relative: 4.1 % (ref 3.0–12.0)
Neutro Abs: 9.8 K/uL — ABNORMAL HIGH (ref 1.4–7.7)
Neutrophils Relative %: 91.1 % — ABNORMAL HIGH (ref 43.0–77.0)
Platelets: 189 K/uL (ref 150.0–400.0)
RBC: 4.27 Mil/uL (ref 3.87–5.11)
RDW: 20.6 % — ABNORMAL HIGH (ref 11.5–15.5)
WBC: 10.8 K/uL — ABNORMAL HIGH (ref 4.0–10.5)

## 2024-06-14 LAB — PULMONARY FUNCTION TEST
DL/VA % pred: 76 %
DL/VA: 3.23 ml/min/mmHg/L
DLCO cor % pred: 47 %
DLCO cor: 7.89 ml/min/mmHg
DLCO unc % pred: 46 %
DLCO unc: 7.71 ml/min/mmHg
FEF 25-75 Pre: 2.14 L/s
FEF2575-%Pred-Pre: 148 %
FEV1-%Pred-Pre: 75 %
FEV1-Pre: 1.32 L
FEV1FVC-%Pred-Pre: 119 %
FEV6-%Pred-Pre: 66 %
FEV6-Pre: 1.47 L
FEV6FVC-%Pred-Pre: 105 %
FVC-%Pred-Pre: 63 %
FVC-Pre: 1.48 L
Pre FEV1/FVC ratio: 89 %
Pre FEV6/FVC Ratio: 100 %

## 2024-06-14 LAB — COMPREHENSIVE METABOLIC PANEL WITH GFR
ALT: 33 U/L (ref 0–35)
AST: 19 U/L (ref 0–37)
Albumin: 3.9 g/dL (ref 3.5–5.2)
Alkaline Phosphatase: 38 U/L — ABNORMAL LOW (ref 39–117)
BUN: 35 mg/dL — ABNORMAL HIGH (ref 6–23)
CO2: 27 meq/L (ref 19–32)
Calcium: 9.1 mg/dL (ref 8.4–10.5)
Chloride: 105 meq/L (ref 96–112)
Creatinine, Ser: 0.8 mg/dL (ref 0.40–1.20)
GFR: 72.14 mL/min (ref >60.00)
Glucose, Bld: 55 mg/dL — ABNORMAL LOW (ref 70–99)
Potassium: 3.6 meq/L (ref 3.5–5.1)
Sodium: 140 meq/L (ref 135–145)
Total Bilirubin: 0.4 mg/dL (ref 0.2–1.2)
Total Protein: 6.2 g/dL (ref 6.0–8.3)

## 2024-06-14 LAB — BRAIN NATRIURETIC PEPTIDE: Pro B Natriuretic peptide (BNP): 167 pg/mL — ABNORMAL HIGH (ref 0.0–100.0)

## 2024-06-14 MED ORDER — FUROSEMIDE 20 MG PO TABS: 20.0000 mg | ORAL_TABLET | Freq: Every day | ORAL | 2 refills | Status: DC

## 2024-06-14 MED ORDER — PREDNISONE 10 MG PO TABS: ORAL_TABLET | ORAL | 1 refills | Status: DC

## 2024-06-14 MED ORDER — POTASSIUM CHLORIDE ER 10 MEQ PO TBCR: 10.0000 meq | EXTENDED_RELEASE_TABLET | Freq: Every day | ORAL | 2 refills | Status: DC

## 2024-06-14 NOTE — Patient Instructions (Signed)
 Spirometry and DLCO performed today.

## 2024-06-14 NOTE — Telephone Encounter (Signed)
 Auth Submission: APPROVED Site of care: Site of care: [facility] Payer: [employer] medicare Medication & CPT/J Code(s) submitted: Ruxience  (Rituximab -pvvr) [id number] Diagnosis Code:  Route of submission (phone, fax, portal): portal Phone # Fax # Auth type: Buy/Bill PB Units/visits requested: 1000mg  x 2 doses Reference number: [id number] Approval from: 05/31/24 to 11/22/24   This covers the Ruxience  for the time period and not the # of doses.

## 2024-06-14 NOTE — Patient Instructions (Addendum)
 ICD-10-CM   1. Hospital discharge follow-up  Z09     2. 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 oil pain and ? mold in house  J67.9     3. ILD (interstitial lung disease) (HCC)  J84.9     4. Chronic respiratory failure with hypoxia (HCC)  J96.11     5. High risk medication use  Z79.899     6. Visit for monitoring Rituxan  therapy  Z51.81    Z79.620     7. Encounter for long term use of mycophenolate  mofetil  Z79.624     8. Current chronic use of systemic steroids  Z79.52     9. Edema, unspecified type  R60.9     10. Physical deconditioning  R53.81     11. Rectal bleeding  K62.5     12. History of pulmonary embolism  Z86.711     13. Steroid-induced hyperglycemia  R73.9    T38.0X5A     14. 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 oil pain and ? mold in house ILD (interstitial lung disease) (HCC) Chronic respiratory failure with hypoxia (HCC) High risk medication use Visit for monitoring Rituxan  therapy Encounter for long term use of mycophenolate  mofetil Current chronic use of systemic steroids  Glad you are improved.  You are able to do a sit/stand test x 5 without desaturation Goal is to at least hold your lung disease at the current level with much less prednisone  Prednisone  while helping is causing significant side effects We will have to accept triple immunosuppression at this point to help protect your lungs  Plan   - Reduce prednisone  to 50 mg/day x 2 weeks and then go to prednisone  40 mg/day and stay there till further instructions - Prefer to reduce prednisone  less than 40 mg/day once you are 4-8 weeks into Rituxan  therapy from May 25, 2024 - Continue Rituxan  every 6 months [next dose would be in January 2026] - Continue CellCept  1000 mg twice daily - Continue Bactrim  for PJP prophylaxis - Check blood work CBC chemistry and d liver function  Edema, unspecified type -could be  related to prednisone  or CellCept  or diastolic dysfunction Onset was posthospitalization  Plan  - Restart Lasix  20 mg/day with 10 mill equivalents of potassium - check bNP 06/14/2024    Physical deconditioning  -Improved  Plan  - Continue with physical therapy and other exercise programs  Dizzineess   ? \sterid related  Plan  - per PCP Rhonda Darice CROME, MD   Rectal bleeding -onset early July 2025 in the hospital  Plan  - per GI; support procedure  History of pulmonary embolism- small July 2025  Plan  - continue eliquis   0 if bleediong severe have to stop or lower dose  Steroid INduced High sugar  Plan  - refer Dr Tawni Hidalgo   [facility] - 4 weeks with Candis Baptise or Tammy Paret or Katie Cobb  - 8 weeks Kachina Niederer - 30 min

## 2024-06-14 NOTE — Progress Notes (Signed)
 Spirometry and DLCO performed today.

## 2024-06-14 NOTE — Addendum Note (Signed)
 Addended by: DAYNE SHERRY RAMAN on: 06/14/2024 09:46 AM   Modules accepted: Level of Service

## 2024-06-15 ENCOUNTER — Encounter (HOSPITAL_COMMUNITY)

## 2024-06-16 NOTE — Telephone Encounter (Signed)
 Pt scheduled for 8/29 virtual visit. Pt aware. Pt verbalized understanding. All questions if any were answered.

## 2024-06-20 ENCOUNTER — Encounter (HOSPITAL_COMMUNITY)

## 2024-06-20 ENCOUNTER — Encounter: Payer: Self-pay | Admitting: Internal Medicine

## 2024-06-21 ENCOUNTER — Encounter: Payer: Self-pay | Admitting: Internal Medicine

## 2024-06-22 ENCOUNTER — Encounter (HOSPITAL_COMMUNITY)

## 2024-06-22 LAB — FUNGAL ORGANISM REFLEX

## 2024-06-22 LAB — FUNGUS CULTURE WITH STAIN

## 2024-06-22 LAB — FUNGUS CULTURE RESULT

## 2024-06-23 ENCOUNTER — Telehealth: Payer: Self-pay

## 2024-06-23 ENCOUNTER — Other Ambulatory Visit: Payer: Self-pay

## 2024-06-23 DIAGNOSIS — K648 Other hemorrhoids: Secondary | ICD-10-CM

## 2024-06-23 DIAGNOSIS — K625 Hemorrhage of anus and rectum: Secondary | ICD-10-CM

## 2024-06-23 NOTE — Telephone Encounter (Signed)
 Front, please schedule ABG ASAP

## 2024-06-23 NOTE — Telephone Encounter (Signed)
 I put an order for ABG. This needs to bedone in the hospital. Please facilitate ASAP

## 2024-06-23 NOTE — Telephone Encounter (Signed)
 Called RT, no one answered so I left a detailed voice mail asking for them to get her scheduled for this!

## 2024-06-26 NOTE — Telephone Encounter (Signed)
 ABG has been scheduled for this Friday, Aug 8th.

## 2024-06-27 ENCOUNTER — Encounter (HOSPITAL_COMMUNITY)

## 2024-06-29 ENCOUNTER — Telehealth: Payer: Self-pay | Admitting: Internal Medicine

## 2024-06-29 ENCOUNTER — Ambulatory Visit: Admitting: "Endocrinology

## 2024-06-29 ENCOUNTER — Encounter: Payer: Self-pay | Admitting: "Endocrinology

## 2024-06-29 ENCOUNTER — Encounter (HOSPITAL_COMMUNITY)

## 2024-06-29 VITALS — BP 122/70 | HR 85 | Ht 60.0 in | Wt 142.0 lb

## 2024-06-29 DIAGNOSIS — E1165 Type 2 diabetes mellitus with hyperglycemia: Secondary | ICD-10-CM | POA: Diagnosis not present

## 2024-06-29 DIAGNOSIS — E7800 Pure hypercholesterolemia, unspecified: Secondary | ICD-10-CM | POA: Diagnosis not present

## 2024-06-29 DIAGNOSIS — Z794 Long term (current) use of insulin: Secondary | ICD-10-CM

## 2024-06-29 LAB — OPHTHALMOLOGY REPORT-SCANNED

## 2024-06-29 MED ORDER — BAQSIMI ONE PACK 3 MG/DOSE NA POWD: 1.0000 | NASAL | 3 refills | Status: AC | PRN

## 2024-06-29 MED ORDER — REVEFENACIN 175 MCG/3ML IN SOLN: 175.0000 ug | Freq: Every day | RESPIRATORY_TRACT | 5 refills | Status: DC

## 2024-06-29 MED ORDER — APIXABAN 5 MG PO TABS: 5.0000 mg | ORAL_TABLET | Freq: Two times a day (BID) | ORAL | 5 refills | Status: DC

## 2024-06-29 NOTE — Telephone Encounter (Signed)
 I called and spoke with the pt and notified that Yupelri  and Eliquis  have been sent to [facility]. Nothing further needed.

## 2024-06-29 NOTE — Telephone Encounter (Signed)
**Note De-identified  [place] Obfuscation** Please advise 

## 2024-06-29 NOTE — Patient Instructions (Addendum)
 Will recommend the following: Metformin  XR 500 mg daily 26 units of Lantus   Humalog  10 units before break fast and 12 units before lunch and 8 units before supper Humalog  Correction scale: Use in addition to your meal time/short acting insulin  based on blood sugars as follows:  151 - 190: 1 unit 191 - 230: 2 units 231 - 270: 3 units 271 - 310: 4 units 311 - 350: 5 units 351 - 390: 6 units 391 - 430: 7 units   ________   Goals of DM therapy:  Morning Fasting blood sugar: 80-140  Blood sugar before meals: 80-140 Bed time blood sugar: 100-150  A1C <7%, limited only by hypoglycemia  1.Diabetes medications and their side effects discussed, including hypoglycemia    2. Check blood glucose:  a) Always check blood sugars before driving. Please see below (under hypoglycemia) on how to manage b) Check a minimum of 3 times/day or more as needed when having symptoms of hypoglycemia.   c) Try to check blood glucose before sleeping/in the middle of the night to ensure that it is remaining stable and not dropping less than 100 d) Check blood glucose more often if sick  3. Diet: a) 3 meals per day schedule b: Restrict carbs to 60-70 grams (4 servings) per meal c) Colorful vegetables - 3 servings a day, and low sugar fruit 2 servings/day Plate control method: 1/4 plate protein, 1/4 starch, 1/2 green, yellow, or red vegetables d) Avoid carbohydrate snacks unless hypoglycemic episode, or increased physical activity  4. Regular exercise as tolerated, preferably 3 or more hours a week  5. Hypoglycemia: a)  Do not drive or operate machinery without first testing blood glucose to assure it is over 90 mg%, or if dizzy, lightheaded, not feeling normal, etc, or  if foot or leg is numb or weak. b)  If blood glucose less than 70, take four 5gm Glucose tabs or 15-30 gm Glucose gel.  Repeat every 15 min as needed until blood sugar is >100 mg/dl. If hypoglycemia persists then call 911.   6. Sick day  management: a) Check blood glucose more often b) Continue usual therapy if blood sugars are elevated.   7. Contact the doctor immediately if blood glucose is frequently <60 mg/dl, or an episode of severe hypoglycemia occurs (where someone had to give you glucose/  glucagon  or if you passed out from a low blood glucose), or if blood glucose is persistently >350 mg/dl, for further management  8. A change in level of physical activity or exercise and a change in diet may also affect your blood sugar. Check blood sugars more often and call if needed.  Instructions: 1. Bring glucose meter, blood glucose records on every visit for review 2. Continue to follow up with primary care physician and other providers for medical care 3. Yearly eye  and foot exam 4. Please get blood work done prior to the next appointment

## 2024-06-29 NOTE — Telephone Encounter (Signed)
 Copied from CRM [id number]. Topic: Clinical - Prescription Issue >> Jun 29, 2024  1:39 PM Shona S wrote: Reason for CRM: patient is calling to ask for her eliquis  and yupelri  inhaler, please update patient.  Pt. Reports she only has I day left on both prescriptions, 0 refills. Please send to [facility].

## 2024-06-29 NOTE — Progress Notes (Signed)
 Outpatient Endocrinology Note Rhonda Birmingham, MD  06/29/24   Rhonda [name] 1950/12/28 [id number]  Referring Provider: Geronimo Amel, MD Primary Care Provider: Burney Darice CROME, MD Reason for consultation: Subjective   Assessment & Plan  Diagnoses and all orders for this visit:  Uncontrolled type 2 diabetes mellitus with hyperglycemia (HCC)   Diabetes Type II complicated by steroid worsened hyperglycemia,  Lab Results  Component Value Date   GFR 72.14 06/14/2024   Hba1c goal less than 7, current Hba1c is 7.2 on 06/25/24 Lab Results  Component Value Date   HGBA1C 7.2 (H) 03/15/2024   Will recommend the following: Metformin  XR 500 mg daily 26 units of Lantus   Humalog  10 units before break fast and 12 units before lunch and 8 units before supper Humalog  Correction scale: Use in addition to your meal time/short acting insulin  based on blood sugars as follows:  151 - 190: 1 unit 191 - 230: 2 units 231 - 270: 3 units 271 - 310: 4 units 311 - 350: 5 units 351 - 390: 6 units 391 - 430: 7 units   Currently on 50 mg of prednisone  daily, plans to go down to 40 mg soon No known contraindications/side effects to any of above medications Glucagon  discussed and prescribed with refills on 06/29/24 Was taken off of insulin  pump due to steroid dose adjustments/ 2 hospitalizations  -Last LD and Tg are as follows: Lab Results  Component Value Date   LDLCALC 83 12/08/2018    Lab Results  Component Value Date   TRIG 131 12/08/2018   -On pravastatin  40 mg every day and ezetimibe  10 mg daily -Follow low fat diet and exercise   -Blood pressure goal <140/90 - Microalbumin/creatinine goal is < 30 -Last MA/Cr is as follows: No results found for: MICROALBUR, [id number] -not on ACE/ARB? -diet changes including salt restriction -limit eating outside -counseled BP targets per standards of diabetes care -uncontrolled blood pressure can lead to retinopathy, nephropathy  and cardiovascular and atherosclerotic heart disease  Reviewed and counseled on: -A1C target -Blood sugar targets -Complications of uncontrolled diabetes  -Checking blood sugar before meals and bedtime and bring log next visit -All medications with mechanism of action and side effects -Hypoglycemia management: rule of 15's, Glucagon  Emergency Kit and medical alert ID -low-carb low-fat plate-method diet -At least 20 minutes of physical activity per day -Annual dilated retinal eye exam and foot exam -compliance and follow up needs -follow up as scheduled or earlier if problem gets worse  Call if blood sugar is less than 70 or consistently above 250    Take a 15 gm snack of carbohydrate at bedtime before you go to sleep if your blood sugar is less than 100.    If you are going to fast after midnight for a test or procedure, ask your physician for instructions on how to reduce/decrease your insulin  dose.    Call if blood sugar is less than 70 or consistently above 250  -Treating a low sugar by rule of 15  (15 gms of sugar every 15 min until sugar is more than 70) If you feel your sugar is low, test your sugar to be sure If your sugar is low (less than 70), then take 15 grams of a fast acting Carbohydrate (3-4 glucose tablets or glucose gel or 4 ounces of juice or regular soda) Recheck your sugar 15 min after treating low to make sure it is more than 70 If sugar is still less than 70,  treat again with 15 grams of carbohydrate          Don't drive the hour of hypoglycemia  If unconscious/unable to eat or drink by mouth, use glucagon  injection or nasal spray baqsimi  and call 911. Can repeat again in 15 min if still unconscious.  Return in about 4 weeks (around 07/27/2024) for 9 am double book.   I have reviewed current medications, nurse's notes, allergies, vital signs, past medical and surgical history, family medical history, and social history for this encounter. Counseled patient on  symptoms, examination findings, lab findings, imaging results, treatment decisions and monitoring and prognosis. The patient understood the recommendations and agrees with the treatment plan. All questions regarding treatment plan were fully answered.  Rhonda Birmingham, MD  06/29/24    History of Present Illness Rhonda [name] is a 73 y.o. year old female who presents for evaluation of Type II diabetes mellitus.  Rhonda [name] was first diagnosed in 2017.   Diabetes education +  Home diabetes regimen: 26 units of Lantus   Humalog  10 units before break fast and lunch and 8 units before supper + sliding scale   Metformin    COMPLICATIONS -  MI/Stroke -  retinopathy -  neuropathy -  nephropathy  SYMPTOMS REVIEWED + Polyuria, + on lasix  - Weight loss + Blurred vision  BLOOD SUGAR DATA CGM interpretation: At today's visit, we reviewed her CGM downloads. The full report is scanned in the media. Reviewing the CGM trends, BG are elevated 2pm-9pm.   Physical Exam  BP 122/70   Pulse 85   Ht 5' (1.524 m)   Wt 142 lb (64.4 kg)   SpO2 96%   BMI 27.73 kg/m    Constitutional: well developed, well nourished Head: normocephalic, atraumatic Eyes: sclera anicteric, no redness Neck: supple Lungs: normal respiratory effort Neurology: alert and oriented Skin: dry, no appreciable rashes Musculoskeletal: no appreciable defects Psychiatric: normal mood and affect Diabetic Foot Exam - Simple   No data filed      Current Medications Patient's Medications  New Prescriptions   No medications on file  Previous Medications   ALBUTEROL  (PROAIR  HFA) 108 (90 BASE) MCG/ACT INHALER    Inhale 2 puffs into the lungs every 4 (four) hours as needed for wheezing. Or coughing spells.  You may use 2 Puffs 5-10 minutes before exercise.   ALBUTEROL  (PROVENTIL ) (2.5 MG/3ML) 0.083% NEBULIZER SOLUTION    Take 3 mLs (2.5 mg total) by nebulization every 6 (six) hours as needed for wheezing or shortness  of breath.   ALPHA-D-GALACTOSIDASE (BEANO PO)    Take 1 tablet by mouth as needed (as directed, if eating onion or garlic).   ALPRAZOLAM  (XANAX ) 0.25 MG TABLET    Take 0.25 mg by mouth at bedtime as needed for anxiety or sleep.   AMBULATORY NON FORMULARY MEDICATION    See admin instructions. Allergy injections on Mondays and hold when sick   APIXABAN  (ELIQUIS ) 5 MG TABS TABLET    Take 1 tablet (5 mg total) by mouth 2 (two) times daily.   ARFORMOTEROL  (BROVANA ) 15 MCG/2ML NEBU    Take 2 mLs (15 mcg total) by nebulization 2 (two) times daily.   ASCORBIC ACID (VITAMIN C) 500 MG TABLET    Take 500 mg by mouth daily.   ASHWAGANDHA PO    Take 1 capsule by mouth every other day.   AZELASTINE (ASTELIN) 0.1 % NASAL SPRAY    Place 1 spray into both nostrils in the morning. Use in  each nostril as directed   BUDESONIDE  (PULMICORT ) 0.5 MG/2ML NEBULIZER SOLUTION    Take 2 mLs (0.5 mg total) by nebulization in the morning and at bedtime.   CALCIUM  CARB-CHOLECALCIFEROL (CALCIUM  500 + D PO)    Take 1 tablet by mouth daily.   CHLORPHENIRAMINE (CHLOR-TRIMETON) 4 MG TABLET    Take 2 mg by mouth 2 (two) times daily as needed for allergies or rhinitis.   CHOLECALCIFEROL (VITAMIN D3) 25 MCG (1000 UNIT) TABLET    Take 1,000 Units by mouth every other day.   CONTINUOUS GLUCOSE SENSOR (DEXCOM G7 SENSOR) MISC    Check blood sugar fasting and before meals and at bedtime.   DICYCLOMINE  (BENTYL ) 10 MG CAPSULE    Take 10 mg by mouth 2 (two) times daily as needed for spasms.   DILTIAZEM  (CARDIZEM  CD) 120 MG 24 HR CAPSULE    Take 1 capsule (120 mg total) by mouth daily.   EPINEPHRINE  0.3 MG/0.3 ML IJ SOAJ INJECTION    Inject 0.3 mg into the muscle as needed for anaphylaxis.   EZETIMIBE  (ZETIA ) 10 MG TABLET    Take 1 tablet (10 mg total) by mouth daily.   FLUTICASONE  (FLONASE ) 50 MCG/ACT NASAL SPRAY    Spray 2 sprays into each nostril every day.   FUROSEMIDE  (LASIX  PO)    Take 1 tablet by mouth daily.   FUROSEMIDE  (LASIX ) 20 MG  TABLET    Take 1 tablet (20 mg total) by mouth daily.   HUMALOG  KWIKPEN 100 UNIT/ML KWIKPEN    Inject 8-10 Units into the skin See admin instructions. Inject 10 units into the skin before breakfast & lunch and 8 before supper   HYDROCORTISONE  (ANUSOL -HC) 25 MG SUPPOSITORY    Place 1 suppository (25 mg total) rectally at bedtime for 24 days.   HYPROMELLOSE (NATURES TEARS OP)    Place 1 drop into both eyes in the morning and at bedtime.   INSULIN  DISPOSABLE PUMP (OMNIPOD 5 G7 PODS, GEN 5,) MISC    Inject 1 Device into the skin every 3 (three) days.   LEVOCETIRIZINE (XYZAL ) 5 MG TABLET    Take 5 mg by mouth at bedtime.   METFORMIN  (GLUCOPHAGE -XR) 500 MG 24 HR TABLET    Take 500 mg by mouth daily with breakfast.   METOPROLOL  SUCCINATE (TOPROL -XL) 25 MG 24 HR TABLET    Take 1 tablet (25 mg total) by mouth daily.   MONTELUKAST  (SINGULAIR ) 10 MG TABLET    TAKE 1 TABLET BY MOUTH EVERYDAY AT BEDTIME   MULTIPLE VITAMIN (MULTIVITAMIN) TABLET    Take 1 tablet by mouth daily with breakfast.   MYCOPHENOLATE  (CELLCEPT ) 500 MG TABLET    Take 2 tablets (1,000 mg total) by mouth 2 (two) times daily.   NITROGLYCERIN  (NITROGLYN) 2 % OINTMENT    Apply 0.25 inches topically 2 (two) times daily. Apply To anus and surrounding tissue. Use pea size amount   NITROGLYCERIN  (NITROSTAT ) 0.4 MG SL TABLET    Place 1 tablet (0.4 mg total) under the tongue every 5 (five) minutes as needed for chest pain.   NYSTATIN  CREAM (MYCOSTATIN )    Apply 1 Application topically 2 (two) times daily as needed (for irritation- affected sites).   OMEPRAZOLE  (PRILOSEC  OTC) 20 MG TABLET    Take 10 mg by mouth See admin instructions. Take 10 mg by mouth at 4:30 AM   OXYGEN     Inhale 4-5 L/min into the lungs See admin instructions. Inhale 4-5 L/min at bedtime and as needed  for shortness of breath during the day   POTASSIUM CHLORIDE  (KLOR-CON  10) 10 MEQ TABLET    Take 1 tablet (10 mEq total) by mouth daily.   PRAVASTATIN  (PRAVACHOL ) 40 MG TABLET    Take  1 tablet (40 mg total) by mouth every evening.   PREDNISONE  (DELTASONE ) 10 MG TABLET    50 mg daily  x 2 weeks and then 40 mg/day and stay there till further instructions   PREDNISONE  (DELTASONE ) 20 MG TABLET    Take 3 tablets (60 mg total) by mouth daily with breakfast.   PREPARATION H 0.25-50 % GEL    Place 1 application  rectally 2 (two) times daily as needed (for hemorrhoids).   REVEFENACIN  (YUPELRI ) 175 MCG/3ML NEBULIZER SOLUTION    Inhale 1 vial by nebulization daily.   RITUXIMAB -PVVR (RUXIENCE  IV)    Inject into the vein. Infuse 1000mg  at Day 0 and Day 14. Repeat cycle every 6 months   SODIUM CHLORIDE  (OCEAN) 0.65 % SOLN NASAL SPRAY    Place 1 spray into both nostrils as needed for congestion.   SODIUM CHLORIDE , INHALANT, 7 % NEBU    Use per nebulizer once a day to help clear sputum   SULFAMETHOXAZOLE -TRIMETHOPRIM  (BACTRIM  DS) 800-160 MG TABLET    Take 1 tablet by mouth every Monday, Wednesday, and Friday.  Modified Medications   No medications on file  Discontinued Medications   No medications on file    Allergies Allergies  Allergen Reactions   Ozempic (0.25 Or 0.5 Mg-Dose) [Semaglutide(0.25 Or 0.5mg -Dos)] Other (See Comments)    Paralyzed the stomach   Tape Other (See Comments)    SKIN IS VERY FRAGILE!! Burns and pulls up the skin!! SKIN BRUISES and TEARS EASILY.SABRA   Betadine [Povidone Iodine] Other (See Comments)    Blisters    Codeine Nausea And Vomiting   Garlic Diarrhea   Hydrocodone  Nausea And Vomiting   Iodine Other (See Comments)    Blisters    Macrobid [Nitrofurantoin] Other (See Comments)    Instructed by dr not to take    Ofev  [Nintedanib] Other (See Comments)    Abdominal pain   Onion Diarrhea   Shellfish Allergy Nausea And Vomiting   Statins Other (See Comments)    Leg pain, but Pravachol  is tolerated   Sulfa  Antibiotics Other (See Comments)    Headaches     Past Medical History Past Medical History:  Diagnosis Date   Allergy    Arthritis     history spinal stenosis. osteoarthritis right hip   Asthma    Cataract    Coronary artery calcification seen on CAT scan 08/19/2017   >300 on CT scan 08/2017   DDD (degenerative disc disease), thoracic    Depression    DOE (dyspnea on exertion)    a. 04/2010 Lexi MV EF 71%, no ischemia/infarct;     Fatty liver    GERD (gastroesophageal reflux disease)    H/O steroid therapy    Steroid use orally over 4 yrs- for Lung Fibrosis   Heart palpitations 02/28/2015   Helicobacter pylori ab+    Hemorrhoids    Hiatal hernia    High cholesterol    History of chronic bronchitis    as child   History of migraine    History of MRSA infection    Hyperlipidemia, mixed 08/19/2017   Hyperplastic colon polyp 2007   IBS (irritable bowel syndrome)    Inguinal hernia    right   Insulin  resistance    past  Interstitial lung disease (HCC)    MVP (mitral valve prolapse)    Posterior mitral valve leaflet with mild MR   Pneumonia    Pneumonitis, hypersensitivity (HCC)    a. 09/2012 s/p Bx - ? 2/2 bird, mold, oil paint exposure ->on steroids, followed by pulm.   PONV (postoperative nausea and vomiting)    Pre-diabetes    takes Metformin    Pulmonary fibrosis (HCC)    Dr. Geronimo follows- stable at present   PVC (premature ventricular contraction) 08/19/2017   Rapid heart rate    Dr. Shlomo follows- last visit Epic note 9'16   Squamous cell carcinoma of skin    Tinnitus, right ear    Vocal cord ulcer     Past Surgical History Past Surgical History:  Procedure Laterality Date   44 HOUR PH STUDY N/A 02/21/2018   Procedure: 24 HOUR PH STUDY;  Surgeon: Shila Gustav GAILS, MD;  Location: [facility];  Service: Endoscopy;  Laterality: N/A;   BREAST BIOPSY Right 2009   BIOPSY, pt denies   BREAST BIOPSY Left 2003   Benign    CATARACT EXTRACTION Left    CESAREAN SECTION     COLONOSCOPY     COLONOSCOPY N/A 05/28/2024   Procedure: COLONOSCOPY;  Surgeon: Albertus Gordy HERO, MD;  Location: [facility];   Service: Gastroenterology;  Laterality: N/A;   ESOPHAGEAL MANOMETRY N/A 02/21/2018   Procedure: ESOPHAGEAL MANOMETRY (EM);  Surgeon: Shila Gustav GAILS, MD;  Location: [facility];  Service: Endoscopy;  Laterality: N/A;   FOOT FRACTURE SURGERY  2006 or 2007   right   HYMENECTOMY     LUNG BIOPSY  09/28/2012   Procedure: LUNG BIOPSY;  Surgeon: Elspeth JAYSON Millers, MD;  [facility];  Service: Thoracic;  Laterality: N/A;  lung biopsies tims three   RIGHT HEART CATH N/A 04/04/2024   Procedure: RIGHT HEART CATH;  Surgeon: Rolan Ezra RAMAN, MD;  Location: [facility] INVASIVE CV LAB;  Service: Cardiovascular;  Laterality: N/A;   SQUAMOUS CELL CARCINOMA EXCISION Left    left arm   TOTAL HIP ARTHROPLASTY Right 09/10/2015   Procedure: RIGHT TOTAL HIP ARTHROPLASTY ANTERIOR APPROACH;  Surgeon: Donnice Car, MD;  Location: [facility];  Service: Orthopedics;  Laterality: Right;   TOTAL HIP ARTHROPLASTY Left 10/03/2019   Procedure: TOTAL HIP ARTHROPLASTY ANTERIOR APPROACH;  Surgeon: Car Donnice, MD;  Location: [facility];  Service: Orthopedics;  Laterality: Left;  70 mins   TUBAL LIGATION     UPPER GI ENDOSCOPY     VIDEO ASSISTED THORACOSCOPY  09/28/2012   Procedure: VIDEO ASSISTED THORACOSCOPY;  Surgeon: Elspeth JAYSON Millers, MD;  Location: [facility] OR;  Service: Thoracic;  Laterality: Right;   VIDEO BRONCHOSCOPY  11/19/2011   Procedure: VIDEO BRONCHOSCOPY WITH FLUORO;  Surgeon: Dorethia Geronimo, MD;  Location: [facility] ENDOSCOPY;  Service: Endoscopy;;   VIDEO BRONCHOSCOPY Bilateral 05/23/2024   Procedure: VIDEO BRONCHOSCOPY WITHOUT FLUORO;  Surgeon: Mannam, Praveen, MD;  Location: [facility];  Service: Cardiopulmonary;  Laterality: Bilateral;    Family History family history includes Allergic rhinitis in her daughter; Asthma in her maternal grandmother and mother; Bone cancer in her paternal grandfather; Breast cancer in her cousin, cousin, cousin, paternal aunt, and paternal aunt; Dementia (age of onset: 1) in her mother;  Diabetes in her father; Emphysema in her maternal grandmother; Heart disease in her sister; Hypertension in her sister; Kidney disease in her sister; Lung disease in her maternal grandfather; Lymphoma in her father; Osteoarthritis in her mother; Ovarian cancer in her maternal aunt.  Social History Social History   Socioeconomic History   Marital status: Married    Spouse name: [name]   Number of children: 1   Years of education: Not on file   Highest education level: Master's degree (e.g., MA, MS, MEng, MEd, MSW, MBA)  Occupational History   Occupation: SELF EMPLOYED    Comment: Careers information officer, retired    Associate Professor: [employer]  Tobacco Use   Smoking status: Never   Smokeless tobacco: Never   Tobacco comments:    pt states she experimented in college  Vaping Use   Vaping status: Never Used  Substance and Sexual Activity   Alcohol use: Yes    Alcohol/week: 2.0 standard drinks of alcohol    Types: 1 Glasses of wine, 1 Standard drinks or equivalent per week    Comment: once a wk. 1-2 glasses   Drug use: No   Sexual activity: Never    Birth control/protection: None    Comment: sex partners in the last 12 months 0  Other Topics Concern   Not on file  Social History Narrative   Married; Doctor, general practice      Exercise - swimming daily for 20-30 minutes   Patient was started her periods at 73 year old (regular periods), and painful periods      Social Drivers of Corporate investment banker Strain: Not on file  Food Insecurity: No Food Insecurity (05/23/2024)   Hunger Vital Sign    Worried About Running Out of Food in the Last Year: Never true    Ran Out of Food in the Last Year: Never true  Transportation Needs: No Transportation Needs (05/23/2024)   PRAPARE - Administrator, Civil Service (Medical): No    Lack of Transportation (Non-Medical): No  Physical Activity: Not on file  Stress: Not on file  Social Connections: Socially Integrated  (05/23/2024)   Social Connection and Isolation Panel    Frequency of Communication with Friends and Family: More than three times a week    Frequency of Social Gatherings with Friends and Family: More than three times a week    Attends Religious Services: 1 to 4 times per year    Active Member of Clubs or Organizations: No    Attends Engineer, structural: More than 4 times per year    Marital Status: Married  Catering manager Violence: Not At Risk (05/23/2024)   Humiliation, Afraid, Rape, and Kick questionnaire    Fear of Current or Ex-Partner: No    Emotionally Abused: No    Physically Abused: No    Sexually Abused: No    Lab Results  Component Value Date   HGBA1C 7.2 (H) 03/15/2024   HGBA1C 6.2 (H) 09/28/2019   HGBA1C 6.0 (H) 12/08/2018   Lab Results  Component Value Date   CHOL 177 12/08/2018   Lab Results  Component Value Date   HDL 68 12/08/2018   Lab Results  Component Value Date   LDLCALC 83 12/08/2018   Lab Results  Component Value Date   TRIG 131 12/08/2018   Lab Results  Component Value Date   CHOLHDL 2.6 12/08/2018   Lab Results  Component Value Date   CREATININE 0.80 06/14/2024   Lab Results  Component Value Date   GFR 72.14 06/14/2024   No results found for: MACKEY CURRENT    Component Value Date/Time   NA 140 06/14/2024 1011   NA 142 12/08/2018 0917   K 3.6 06/14/2024  1011   CL 105 06/14/2024 1011   CO2 27 06/14/2024 1011   GLUCOSE 55 (L) 06/14/2024 1011   BUN 35 (H) 06/14/2024 1011   BUN 18 12/08/2018 0917   CREATININE 0.80 06/14/2024 1011   CREATININE 1.12 (H) 05/04/2024 1315   CALCIUM  9.1 06/14/2024 1011   PROT 6.2 06/14/2024 1011   PROT 6.5 12/08/2018 0917   ALBUMIN 3.9 06/14/2024 1011   ALBUMIN 3.9 12/08/2018 0917   AST 19 06/14/2024 1011   ALT 33 06/14/2024 1011   ALKPHOS 38 (L) 06/14/2024 1011   BILITOT 0.4 06/14/2024 1011   BILITOT 0.4 12/08/2018 0917   GFRNONAA >60 05/30/2024 0823   GFRNONAA 66 02/26/2015  1540   GFRAA >60 10/04/2019 0216   GFRAA 77 02/26/2015 1540      Latest Ref Rng & Units 06/14/2024   10:11 AM 06/06/2024   12:13 PM 05/30/2024    8:23 AM  BMP  Glucose 70 - 99 mg/dL 55  810  796   BUN 6 - 23 mg/dL 35  37  29   Creatinine 0.40 - 1.20 mg/dL 9.19  9.02  9.25   Sodium 135 - 145 mEq/L 140  134  136   Potassium 3.5 - 5.1 mEq/L 3.6  5.3 No hemolysis seen  5.5   Chloride 96 - 112 mEq/L 105  101  98   CO2 19 - 32 mEq/L 27  28  28    Calcium  8.4 - 10.5 mg/dL 9.1  9.6  9.0        Component Value Date/Time   WBC 10.8 (H) 06/14/2024 1011   RBC 4.27 06/14/2024 1011   HGB 12.4 06/14/2024 1011   HCT 37.8 06/14/2024 1011   PLT 189.0 06/14/2024 1011   MCV 88.5 06/14/2024 1011   MCH 28.4 05/30/2024 0823   MCHC 32.9 06/14/2024 1011   RDW 20.6 (H) 06/14/2024 1011   LYMPHSABS 0.5 (L) 06/14/2024 1011   MONOABS 0.4 06/14/2024 1011   EOSABS 0.0 06/14/2024 1011   BASOSABS 0.0 06/14/2024 1011     Parts of this note may have been dictated using voice recognition software. There may be variances in spelling and vocabulary which are unintentional. Not all errors are proofread. Please notify the dino if any discrepancies are noted or if the meaning of any statement is not clear.

## 2024-06-30 ENCOUNTER — Encounter: Payer: Self-pay | Admitting: "Endocrinology

## 2024-06-30 ENCOUNTER — Ambulatory Visit (HOSPITAL_COMMUNITY)
Admission: RE | Admit: 2024-06-30 | Discharge: 2024-06-30 | Disposition: A | Discharge status: Home / Self Care | Source: Ambulatory Visit | Attending: Internal Medicine | Admitting: Internal Medicine

## 2024-06-30 DIAGNOSIS — J9611 Chronic respiratory failure with hypoxia: Secondary | ICD-10-CM | POA: Diagnosis present

## 2024-06-30 LAB — BLOOD GAS, ARTERIAL
Acid-base deficit: 3.3 mmol/L — ABNORMAL HIGH (ref 0.0–2.0)
Bicarbonate: 19.9 mmol/L — ABNORMAL LOW (ref 20.0–28.0)
Drawn by: 560031
O2 Saturation: 98.1 %
Patient temperature: 36.6
pCO2 arterial: 29 mmHg — ABNORMAL LOW (ref 32–48)
pH, Arterial: 7.44 (ref 7.35–7.45)
pO2, Arterial: 88 mmHg (ref 83–108)

## 2024-06-30 NOTE — Progress Notes (Signed)
 Latest Reference Range & Units 06/30/24 09:59  pH, Arterial 7.35 - 7.45  7.44  pCO2 arterial 32 - 48 mmHg 29 (L)  pO2, Arterial 83 - 108 mmHg 88  Acid-base deficit 0.0 - 2.0 mmol/L 3.3 (H)  Bicarbonate 20.0 - 28.0 mmol/L 19.9 (L)  O2 Saturation % 98.1  Patient temperature  36.6  Collection site  RIGHT RADIAL  Allens test (pass/fail) PASS  PASS  (L): Data is abnormally low (H): Data is abnormally high  Pt sats while here 97%. Results recorded above.

## 2024-07-03 ENCOUNTER — Other Ambulatory Visit: Payer: Self-pay

## 2024-07-03 ENCOUNTER — Other Ambulatory Visit: Payer: Self-pay | Admitting: "Endocrinology

## 2024-07-03 DIAGNOSIS — E1165 Type 2 diabetes mellitus with hyperglycemia: Secondary | ICD-10-CM

## 2024-07-03 MED ORDER — APIXABAN 5 MG PO TABS: 5.0000 mg | ORAL_TABLET | Freq: Two times a day (BID) | ORAL | 5 refills | Status: DC

## 2024-07-03 MED ORDER — REVEFENACIN 175 MCG/3ML IN SOLN: 175.0000 ug | Freq: Every day | RESPIRATORY_TRACT | 5 refills | Status: DC

## 2024-07-03 NOTE — Telephone Encounter (Signed)
 Pt is scheduled for labs 07/19/24.

## 2024-07-04 ENCOUNTER — Encounter: Payer: Self-pay | Admitting: "Endocrinology

## 2024-07-04 ENCOUNTER — Encounter (HOSPITAL_COMMUNITY)

## 2024-07-04 ENCOUNTER — Telehealth (INDEPENDENT_AMBULATORY_CARE_PROVIDER_SITE_OTHER): Admitting: "Endocrinology

## 2024-07-04 VITALS — Ht 60.0 in | Wt 141.0 lb

## 2024-07-04 DIAGNOSIS — Z794 Long term (current) use of insulin: Secondary | ICD-10-CM

## 2024-07-04 DIAGNOSIS — E1165 Type 2 diabetes mellitus with hyperglycemia: Secondary | ICD-10-CM | POA: Diagnosis not present

## 2024-07-04 DIAGNOSIS — E7800 Pure hypercholesterolemia, unspecified: Secondary | ICD-10-CM | POA: Diagnosis not present

## 2024-07-04 DIAGNOSIS — Z7984 Long term (current) use of oral hypoglycemic drugs: Secondary | ICD-10-CM | POA: Diagnosis not present

## 2024-07-04 MED ORDER — HYDROCORTISONE ACETATE 25 MG RE SUPP: 25.0000 mg | Freq: Two times a day (BID) | RECTAL | 0 refills | Status: AC

## 2024-07-04 NOTE — Progress Notes (Signed)
 The patient reports they are currently: [name]. I spent 7 minutes on the video with the patient on the date of service. I spent an additional 5 minutes on pre- and post-visit activities on the date of service.   The patient was physically located in [place]  or a state in which I am permitted to provide care. The patient and/or parent/guardian understood that s/he may incur co-pays and cost sharing, and agreed to the telemedicine visit. The visit was reasonable and appropriate under the circumstances given the patient's presentation at the time.  The patient and/or parent/guardian has been advised of the potential risks and limitations of this mode of treatment (including, but not limited to, the absence of in-person examination) and has agreed to be treated using telemedicine. The patient's/patient's family's questions regarding telemedicine have been answered.   The patient and/or parent/guardian has also been advised to contact their provider's office for worsening conditions, and seek emergency medical treatment and/or call 911 if the patient deems either necessary.    Outpatient Endocrinology Note Rhonda Birmingham, MD  07/04/24   NOE PITTSLEY [birth year] [id number]  Referring Provider: Burney Darice CROME, MD Primary Care Provider: Burney Darice CROME, MD Reason for consultation: Subjective   Assessment & Plan  Diagnoses and all orders for this visit:  Uncontrolled type 2 diabetes mellitus with hyperglycemia (HCC)  Long-term insulin  use (HCC)  Long term (current) use of oral hypoglycemic drugs  Pure hypercholesterolemia    Diabetes Type II complicated by steroid worsened hyperglycemia,  Lab Results  Component Value Date   GFR 72.14 06/14/2024   Hba1c goal less than 7, current Hba1c is 7.2 on 06/25/24 Lab Results  Component Value Date   HGBA1C 7.2 (H) 03/15/2024   Will recommend the following: Metformin  XR 500 mg daily 24 units of Lantus . Decrease by 1 unit if blood  sugar is less than 100.   Humalog  10 units before break fast and 12 units before lunch and 8 units before supper Humalog  Correction scale: Use in addition to your meal time/short acting insulin  based on blood sugars as follows:  151 - 190: 1 unit 191 - 230: 2 units 231 - 270: 3 units 271 - 310: 4 units 311 - 350: 5 units 351 - 390: 6 units 391 - 430: 7 units   Skip Humalog  if the blood sugar is less than 70 before meal. Cut the dose of Humalog  in half if the blood sugar is between 71-100 before.  Currently on 40 mg of prednisone  daily No known contraindications/side effects to any of above medications Glucagon  discussed and prescribed with refills on 07/04/24 Was taken off of insulin  pump due to steroid dose adjustments/ 2 hospitalizations  -Last LD and Tg are as follows: Lab Results  Component Value Date   LDLCALC 83 12/08/2018    Lab Results  Component Value Date   TRIG 131 12/08/2018   -On pravastatin  40 mg every day and ezetimibe  10 mg daily -Follow low fat diet and exercise   -Blood pressure goal <140/90 - Microalbumin/creatinine goal is < 30 -Last MA/Cr is as follows: No results found for: MICROALBUR, [id number] -not on ACE/ARB? -diet changes including salt restriction -limit eating outside -counseled BP targets per standards of diabetes care -uncontrolled blood pressure can lead to retinopathy, nephropathy and cardiovascular and atherosclerotic heart disease  Reviewed and counseled on: -A1C target -Blood sugar targets -Complications of uncontrolled diabetes  -Checking blood sugar before meals and bedtime and bring log next visit -All medications with mechanism  of action and side effects -Hypoglycemia management: rule of 15's, Glucagon  Emergency Kit and medical alert ID -low-carb low-fat plate-method diet -At least 20 minutes of physical activity per day -Annual dilated retinal eye exam and foot exam -compliance and follow up needs -follow up as scheduled  or earlier if problem gets worse  Call if blood sugar is less than 70 or consistently above 250    Take a 15 gm snack of carbohydrate at bedtime before you go to sleep if your blood sugar is less than 100.    If you are going to fast after midnight for a test or procedure, ask your physician for instructions on how to reduce/decrease your insulin  dose.    Call if blood sugar is less than 70 or consistently above 250  -Treating a low sugar by rule of 15  (15 gms of sugar every 15 min until sugar is more than 70) If you feel your sugar is low, test your sugar to be sure If your sugar is low (less than 70), then take 15 grams of a fast acting Carbohydrate (3-4 glucose tablets or glucose gel or 4 ounces of juice or regular soda) Recheck your sugar 15 min after treating low to make sure it is more than 70 If sugar is still less than 70, treat again with 15 grams of carbohydrate          Don't drive the hour of hypoglycemia  If unconscious/unable to eat or drink by mouth, use glucagon  injection or nasal spray baqsimi  and call 911. Can repeat again in 15 min if still unconscious.  No follow-ups on file.   I have reviewed current medications, nurse's notes, allergies, vital signs, past medical and surgical history, family medical history, and social history for this encounter. Counseled patient on symptoms, examination findings, lab findings, imaging results, treatment decisions and monitoring and prognosis. The patient understood the recommendations and agrees with the treatment plan. All questions regarding treatment plan were fully answered.  Rhonda Birmingham, MD  07/04/24    History of Present Illness Rhonda [name] is a 73 y.o. year old female who presents for follow up of Type II diabetes mellitus.  Rhonda [name] was first diagnosed in 2017.   Diabetes education +  Home diabetes regimen: 25 units of Lantus   Humalog  10 units before break fast and 12 units before lunch and 8 units  before supper Humalog  Correction scale: Use in addition to your meal time/short acting insulin  based on blood sugars as follows:  151 - 190: 1 unit 191 - 230: 2 units 231 - 270: 3 units 271 - 310: 4 units 311 - 350: 5 units 351 - 390: 6 units 391 - 430: 7 units   Metformin    COMPLICATIONS -  MI/Stroke -  retinopathy -  neuropathy -  nephropathy  BLOOD SUGAR DATA CGM interpretation: At today's visit, we reviewed her CGM downloads. The full report is scanned in the media. Reviewing the CGM trends, BG are elevated around lunch & supper, with some lows/highs overnight.  Physical Exam  Ht 5' (1.524 m)   Wt 141 lb (64 kg)   BMI 27.54 kg/m    Constitutional: well developed, well nourished Head: normocephalic, atraumatic Eyes: sclera anicteric, no redness Neck: supple Lungs: normal respiratory effort Neurology: alert and oriented Skin: dry, no appreciable rashes Musculoskeletal: no appreciable defects Psychiatric: normal mood and affect Diabetic Foot Exam - Simple   No data filed      Current Medications  Patient's Medications  New Prescriptions   No medications on file  Previous Medications   ALBUTEROL  (PROAIR  HFA) 108 (90 BASE) MCG/ACT INHALER    Inhale 2 puffs into the lungs every 4 (four) hours as needed for wheezing. Or coughing spells.  You may use 2 Puffs 5-10 minutes before exercise.   ALBUTEROL  (PROVENTIL ) (2.5 MG/3ML) 0.083% NEBULIZER SOLUTION    Take 3 mLs (2.5 mg total) by nebulization every 6 (six) hours as needed for wheezing or shortness of breath.   ALPHA-D-GALACTOSIDASE (BEANO PO)    Take 1 tablet by mouth as needed (as directed, if eating onion or garlic).   ALPRAZOLAM  (XANAX ) 0.25 MG TABLET    Take 0.25 mg by mouth at bedtime as needed for anxiety or sleep.   AMBULATORY NON FORMULARY MEDICATION    See admin instructions. Allergy injections on Mondays and hold when sick   APIXABAN  (ELIQUIS ) 5 MG TABS TABLET    Take 1 tablet (5 mg total) by mouth 2 (two)  times daily.   ARFORMOTEROL  (BROVANA ) 15 MCG/2ML NEBU    Take 2 mLs (15 mcg total) by nebulization 2 (two) times daily.   ASCORBIC ACID (VITAMIN C) 500 MG TABLET    Take 500 mg by mouth daily.   ASHWAGANDHA PO    Take 1 capsule by mouth every other day.   AZELASTINE (ASTELIN) 0.1 % NASAL SPRAY    Place 1 spray into both nostrils in the morning. Use in each nostril as directed   BUDESONIDE  (PULMICORT ) 0.5 MG/2ML NEBULIZER SOLUTION    Take 2 mLs (0.5 mg total) by nebulization in the morning and at bedtime.   CALCIUM  CARB-CHOLECALCIFEROL (CALCIUM  500 + D PO)    Take 1 tablet by mouth daily.   CHLORPHENIRAMINE (CHLOR-TRIMETON) 4 MG TABLET    Take 2 mg by mouth 2 (two) times daily as needed for allergies or rhinitis.   CHOLECALCIFEROL (VITAMIN D3) 25 MCG (1000 UNIT) TABLET    Take 1,000 Units by mouth every other day.   CONTINUOUS GLUCOSE SENSOR (DEXCOM G7 SENSOR) MISC    Check blood sugar fasting and before meals and at bedtime.   DICYCLOMINE  (BENTYL ) 10 MG CAPSULE    Take 10 mg by mouth 2 (two) times daily as needed for spasms.   DILTIAZEM  (CARDIZEM  CD) 120 MG 24 HR CAPSULE    Take 1 capsule (120 mg total) by mouth daily.   EPINEPHRINE  0.3 MG/0.3 ML IJ SOAJ INJECTION    Inject 0.3 mg into the muscle as needed for anaphylaxis.   EZETIMIBE  (ZETIA ) 10 MG TABLET    Take 1 tablet (10 mg total) by mouth daily.   FLUTICASONE  (FLONASE ) 50 MCG/ACT NASAL SPRAY    Spray 2 sprays into each nostril every day.   FUROSEMIDE  (LASIX  PO)    Take 1 tablet by mouth daily.   FUROSEMIDE  (LASIX ) 20 MG TABLET    Take 1 tablet (20 mg total) by mouth daily.   GLUCAGON  (BAQSIMI  ONE PACK) 3 MG/DOSE POWD    Place 1 Device into the nose as needed (Low blood sugar with impaired consciousness).   HUMALOG  KWIKPEN 100 UNIT/ML KWIKPEN    Inject 8-10 Units into the skin See admin instructions. Inject 10 units into the skin before breakfast & lunch and 8 before supper   HYPROMELLOSE (NATURES TEARS OP)    Place 1 drop into both eyes in  the morning and at bedtime.   INSULIN  DISPOSABLE PUMP (OMNIPOD 5 G7 PODS, GEN 5,) MISC  Inject 1 Device into the skin every 3 (three) days.   LEVOCETIRIZINE (XYZAL ) 5 MG TABLET    Take 5 mg by mouth at bedtime.   METFORMIN  (GLUCOPHAGE -XR) 500 MG 24 HR TABLET    Take 500 mg by mouth daily with breakfast.   METOPROLOL  SUCCINATE (TOPROL -XL) 25 MG 24 HR TABLET    Take 1 tablet (25 mg total) by mouth daily.   MONTELUKAST  (SINGULAIR ) 10 MG TABLET    TAKE 1 TABLET BY MOUTH EVERYDAY AT BEDTIME   MULTIPLE VITAMIN (MULTIVITAMIN) TABLET    Take 1 tablet by mouth daily with breakfast.   MYCOPHENOLATE  (CELLCEPT ) 500 MG TABLET    Take 2 tablets (1,000 mg total) by mouth 2 (two) times daily.   NITROGLYCERIN  (NITROGLYN) 2 % OINTMENT    Apply 0.25 inches topically 2 (two) times daily. Apply To anus and surrounding tissue. Use pea size amount   NITROGLYCERIN  (NITROSTAT ) 0.4 MG SL TABLET    Place 1 tablet (0.4 mg total) under the tongue every 5 (five) minutes as needed for chest pain.   NYSTATIN  CREAM (MYCOSTATIN )    Apply 1 Application topically 2 (two) times daily as needed (for irritation- affected sites).   OMEPRAZOLE  (PRILOSEC  OTC) 20 MG TABLET    Take 10 mg by mouth See admin instructions. Take 10 mg by mouth at 4:30 AM   OXYGEN     Inhale 4-5 L/min into the lungs See admin instructions. Inhale 4-5 L/min at bedtime and as needed for shortness of breath during the day   POTASSIUM CHLORIDE  (KLOR-CON  10) 10 MEQ TABLET    Take 1 tablet (10 mEq total) by mouth daily.   PRAVASTATIN  (PRAVACHOL ) 40 MG TABLET    Take 1 tablet (40 mg total) by mouth every evening.   PREDNISONE  (DELTASONE ) 10 MG TABLET    50 mg daily  x 2 weeks and then 40 mg/day and stay there till further instructions   PREPARATION H 0.25-50 % GEL    Place 1 application  rectally 2 (two) times daily as needed (for hemorrhoids).   REVEFENACIN  (YUPELRI ) 175 MCG/3ML NEBULIZER SOLUTION    Inhale 1 vial by nebulization daily.   RITUXIMAB -PVVR (RUXIENCE  IV)     Inject into the vein. Infuse 1000mg  at Day 0 and Day 14. Repeat cycle every 6 months   SODIUM CHLORIDE  (OCEAN) 0.65 % SOLN NASAL SPRAY    Place 1 spray into both nostrils as needed for congestion.   SODIUM CHLORIDE , INHALANT, 7 % NEBU    Use per nebulizer once a day to help clear sputum   SULFAMETHOXAZOLE -TRIMETHOPRIM  (BACTRIM  DS) 800-160 MG TABLET    Take 1 tablet by mouth every Monday, Wednesday, and Friday.  Modified Medications   No medications on file  Discontinued Medications   No medications on file    Allergies Allergies  Allergen Reactions   Ozempic (0.25 Or 0.5 Mg-Dose) [Semaglutide(0.25 Or 0.5mg -Dos)] Other (See Comments)    Paralyzed the stomach   Tape Other (See Comments)    SKIN IS VERY FRAGILE!! Burns and pulls up the skin!! SKIN BRUISES and TEARS EASILY.SABRA   Betadine [Povidone Iodine] Other (See Comments)    Blisters    Codeine Nausea And Vomiting   Garlic Diarrhea   Hydrocodone  Nausea And Vomiting   Iodine Other (See Comments)    Blisters    Macrobid [Nitrofurantoin] Other (See Comments)    Instructed by dr not to take    Ofev  [Nintedanib] Other (See Comments)    Abdominal pain  Onion Diarrhea   Shellfish Allergy Nausea And Vomiting   Statins Other (See Comments)    Leg pain, but Pravachol  is tolerated   Sulfa  Antibiotics Other (See Comments)    Headaches     Past Medical History Past Medical History:  Diagnosis Date   Allergy    Arthritis    history spinal stenosis. osteoarthritis right hip   Asthma    Cataract    Coronary artery calcification seen on CAT scan 08/19/2017   >300 on CT scan 08/2017   DDD (degenerative disc disease), thoracic    Depression    DOE (dyspnea on exertion)    a. 04/2010 Lexi MV EF 71%, no ischemia/infarct;     Fatty liver    GERD (gastroesophageal reflux disease)    H/O steroid therapy    Steroid use orally over 4 yrs- for Lung Fibrosis   Heart palpitations 02/28/2015   Helicobacter pylori ab+    Hemorrhoids     Hiatal hernia    High cholesterol    History of chronic bronchitis    as child   History of migraine    History of MRSA infection    Hyperlipidemia, mixed 08/19/2017   Hyperplastic colon polyp 2007   IBS (irritable bowel syndrome)    Inguinal hernia    right   Insulin  resistance    past   Interstitial lung disease (HCC)    MVP (mitral valve prolapse)    Posterior mitral valve leaflet with mild MR   Pneumonia    Pneumonitis, hypersensitivity (HCC)    a. 09/2012 s/p Bx - ? 2/2 bird, mold, oil paint exposure ->on steroids, followed by pulm.   PONV (postoperative nausea and vomiting)    Pre-diabetes    takes Metformin    Pulmonary fibrosis (HCC)    Dr. Geronimo follows- stable at present   PVC (premature ventricular contraction) 08/19/2017   Rapid heart rate    Dr. Shlomo follows- last visit Epic note 9'16   Squamous cell carcinoma of skin    Tinnitus, right ear    Vocal cord ulcer     Past Surgical History Past Surgical History:  Procedure Laterality Date   49 HOUR PH STUDY N/A 02/21/2018   Procedure: 24 HOUR PH STUDY;  Surgeon: Shila Gustav GAILS, MD;  Location: [facility];  Service: Endoscopy;  Laterality: N/A;   BREAST BIOPSY Right 2009   BIOPSY, pt denies   BREAST BIOPSY Left 2003   Benign    CATARACT EXTRACTION Left    CESAREAN SECTION     COLONOSCOPY     COLONOSCOPY N/A 05/28/2024   Procedure: COLONOSCOPY;  Surgeon: Albertus Gordy HERO, MD;  Location: [facility];  Service: Gastroenterology;  Laterality: N/A;   ESOPHAGEAL MANOMETRY N/A 02/21/2018   Procedure: ESOPHAGEAL MANOMETRY (EM);  Surgeon: Shila Gustav GAILS, MD;  Location: [facility];  Service: Endoscopy;  Laterality: N/A;   FOOT FRACTURE SURGERY  2006 or 2007   right   HYMENECTOMY     LUNG BIOPSY  09/28/2012   Procedure: LUNG BIOPSY;  Surgeon: Elspeth JAYSON Millers, MD;  Location: [facility] OR;  Service: Thoracic;  Laterality: N/A;  lung biopsies tims three   RIGHT HEART CATH N/A 04/04/2024   Procedure: RIGHT  HEART CATH;  Surgeon: Rolan Ezra RAMAN, MD;  Location: [facility] INVASIVE CV LAB;  Service: Cardiovascular;  Laterality: N/A;   SQUAMOUS CELL CARCINOMA EXCISION Left    left arm   TOTAL HIP ARTHROPLASTY Right 09/10/2015   Procedure: RIGHT TOTAL HIP ARTHROPLASTY  ANTERIOR APPROACH;  Surgeon: Donnice Car, MD;  Location: [facility];  Service: Orthopedics;  Laterality: Right;   TOTAL HIP ARTHROPLASTY Left 10/03/2019   Procedure: TOTAL HIP ARTHROPLASTY ANTERIOR APPROACH;  Surgeon: Car Donnice, MD;  Location: [facility];  Service: Orthopedics;  Laterality: Left;  70 mins   TUBAL LIGATION     UPPER GI ENDOSCOPY     VIDEO ASSISTED THORACOSCOPY  09/28/2012   Procedure: VIDEO ASSISTED THORACOSCOPY;  Surgeon: Elspeth JAYSON Millers, MD;  Location[facility] OR;  Service: Thoracic;  Laterality: Right;   VIDEO BRONCHOSCOPY  11/19/2011   Procedure: VIDEO BRONCHOSCOPY WITH FLUORO;  Surgeon: Dorethia Cave, MD;  Location: [facility] ENDOSCOPY;  Service: Endoscopy;;   VIDEO BRONCHOSCOPY Bilateral 05/23/2024   Procedure: VIDEO BRONCHOSCOPY WITHOUT FLUORO;  Surgeon: Mannam, Praveen, MD;  Location: [facility];  Service: Cardiopulmonary;  Laterality: Bilateral;    Family History family history includes Allergic rhinitis in her daughter; Asthma in her maternal grandmother and mother; Bone cancer in her paternal grandfather; Breast cancer in her cousin, cousin, cousin, paternal aunt, and paternal aunt; Dementia (age of onset: 60) in her mother; Diabetes in her father; Emphysema in her maternal grandmother; Heart disease in her sister; Hypertension in her sister; Kidney disease in her sister; Lung disease in her maternal grandfather; Lymphoma in her father; Osteoarthritis in her mother; Ovarian cancer in her maternal aunt.  Social History Social History   Socioeconomic History   Marital status: Married    Spouse name: [name]   Number of children: 1   Years of education: Not on file   Highest education level: Master's degree (e.g., MA, MS,  MEng, MEd, MSW, MBA)  Occupational History   Occupation: SELF EMPLOYED    Comment: Careers information officer, retired    Associate Professor: [employer]  Tobacco Use   Smoking status: Never   Smokeless tobacco: Never   Tobacco comments:    pt states she experimented in college  Vaping Use   Vaping status: Never Used  Substance and Sexual Activity   Alcohol use: Yes    Alcohol/week: 2.0 standard drinks of alcohol    Types: 1 Glasses of wine, 1 Standard drinks or equivalent per week    Comment: once a wk. 1-2 glasses   Drug use: No   Sexual activity: Never    Birth control/protection: None    Comment: sex partners in the last 12 months 0  Other Topics Concern   Not on file  Social History Narrative   Married; Doctor, general practice      Exercise - swimming daily for 20-30 minutes   Patient was started her periods at 73 year old (regular periods), and painful periods      Social Drivers of Corporate investment banker Strain: Not on file  Food Insecurity: No Food Insecurity (05/23/2024)   Hunger Vital Sign    Worried About Running Out of Food in the Last Year: Never true    Ran Out of Food in the Last Year: Never true  Transportation Needs: No Transportation Needs (05/23/2024)   PRAPARE - Administrator, Civil Service (Medical): No    Lack of Transportation (Non-Medical): No  Physical Activity: Not on file  Stress: Not on file  Social Connections: Socially Integrated (05/23/2024)   Social Connection and Isolation Panel    Frequency of Communication with Friends and Family: More than three times a week    Frequency of Social Gatherings with Friends and Family: More than three times a  week    Attends Religious Services: 1 to 4 times per year    Active Member of Clubs or Organizations: No    Attends Banker Meetings: More than 4 times per year    Marital Status: Married  Catering manager Violence: Not At Risk (05/23/2024)   Humiliation, Afraid, Rape, and Kick  questionnaire    Fear of Current or Ex-Partner: No    Emotionally Abused: No    Physically Abused: No    Sexually Abused: No    Lab Results  Component Value Date   HGBA1C 7.2 (H) 03/15/2024   HGBA1C 6.2 (H) 09/28/2019   HGBA1C 6.0 (H) 12/08/2018   Lab Results  Component Value Date   CHOL 177 12/08/2018   Lab Results  Component Value Date   HDL 68 12/08/2018   Lab Results  Component Value Date   LDLCALC 83 12/08/2018   Lab Results  Component Value Date   TRIG 131 12/08/2018   Lab Results  Component Value Date   CHOLHDL 2.6 12/08/2018   Lab Results  Component Value Date   CREATININE 0.80 06/14/2024   Lab Results  Component Value Date   GFR 72.14 06/14/2024   No results found for: MACKEY CURRENT    Component Value Date/Time   NA 140 06/14/2024 1011   NA 142 12/08/2018 0917   K 3.6 06/14/2024 1011   CL 105 06/14/2024 1011   CO2 27 06/14/2024 1011   GLUCOSE 55 (L) 06/14/2024 1011   BUN 35 (H) 06/14/2024 1011   BUN 18 12/08/2018 0917   CREATININE 0.80 06/14/2024 1011   CREATININE 1.12 (H) 05/04/2024 1315   CALCIUM  9.1 06/14/2024 1011   PROT 6.2 06/14/2024 1011   PROT 6.5 12/08/2018 0917   ALBUMIN 3.9 06/14/2024 1011   ALBUMIN 3.9 12/08/2018 0917   AST 19 06/14/2024 1011   ALT 33 06/14/2024 1011   ALKPHOS 38 (L) 06/14/2024 1011   BILITOT 0.4 06/14/2024 1011   BILITOT 0.4 12/08/2018 0917   GFRNONAA >60 05/30/2024 0823   GFRNONAA 66 02/26/2015 1540   GFRAA >60 10/04/2019 0216   GFRAA 77 02/26/2015 1540      Latest Ref Rng & Units 06/14/2024   10:11 AM 06/06/2024   12:13 PM 05/30/2024    8:23 AM  BMP  Glucose 70 - 99 mg/dL 55  810  796   BUN 6 - 23 mg/dL 35  37  29   Creatinine 0.40 - 1.20 mg/dL 9.19  9.02  9.25   Sodium 135 - 145 mEq/L 140  134  136   Potassium 3.5 - 5.1 mEq/L 3.6  5.3 No hemolysis seen  5.5   Chloride 96 - 112 mEq/L 105  101  98   CO2 19 - 32 mEq/L 27  28  28    Calcium  8.4 - 10.5 mg/dL 9.1  9.6  9.0        Component  Value Date/Time   WBC 10.8 (H) 06/14/2024 1011   RBC 4.27 06/14/2024 1011   HGB 12.4 06/14/2024 1011   HCT 37.8 06/14/2024 1011   PLT 189.0 06/14/2024 1011   MCV 88.5 06/14/2024 1011   MCH 28.4 05/30/2024 0823   MCHC 32.9 06/14/2024 1011   RDW 20.6 (H) 06/14/2024 1011   LYMPHSABS 0.5 (L) 06/14/2024 1011   MONOABS 0.4 06/14/2024 1011   EOSABS 0.0 06/14/2024 1011   BASOSABS 0.0 06/14/2024 1011     Parts of this note may have been dictated using voice recognition  software. There may be variances in spelling and vocabulary which are unintentional. Not all errors are proofread. Please notify the dino if any discrepancies are noted or if the meaning of any statement is not clear.

## 2024-07-05 ENCOUNTER — Encounter: Payer: Self-pay | Admitting: "Endocrinology

## 2024-07-05 NOTE — Telephone Encounter (Addendum)
 Patient enrolled into asthma grant through [facility] Amount: $2000 Award Period: 01/08/2024 - 07/06/2025 ID: [id number] BIN: [id number] PCN: [id number] Group: [id number] For pharmacy inquiries, contact PDMI at [phone]. For patient inquiries, contact PAF at [phone]  Patient can be scheduled DPX new start if interested

## 2024-07-06 ENCOUNTER — Encounter (HOSPITAL_COMMUNITY)

## 2024-07-06 LAB — ACID FAST CULTURE WITH REFLEXED SENSITIVITIES (MYCOBACTERIA)
Acid Fast Culture: NEGATIVE
Acid Fast Culture: NEGATIVE

## 2024-07-06 MED ORDER — DUPIXENT 300 MG/2ML ~~LOC~~ SOAJ
SUBCUTANEOUS | 0 refills | Status: DC
  Filled 2024-07-10: qty 8, 28d supply, fill #0

## 2024-07-06 NOTE — Telephone Encounter (Signed)
 Rhonda [name]

## 2024-07-06 NOTE — Telephone Encounter (Signed)
 ATC patient to discuss new start DPX scheduling. She requested call back this afternoon as she was at an appointment.   Aleck Puls, PharmD, BCPS Clinical Pharmacist  [facility]

## 2024-07-06 NOTE — Telephone Encounter (Signed)
 Pt scheduled for new start DPX, appt 07/17/24. Aware she will receive call from [facility] for onboarding.   Aleck Puls, PharmD, BCPS Clinical Pharmacist  [facility]

## 2024-07-09 NOTE — Progress Notes (Unsigned)
 Rhonda Console, PA-C [address] [place], [place]  [address] Phone: [phone]   Primary Care Physician: Burney Darice CROME, MD  Primary Gastroenterologist:  Rhonda Console, PA-C / Dr. Gordy Starch   Chief Complaint: Follow-up rectal bleeding, internal hemorrhoids, history of adenomatous colon polyps       HPI:   Rhonda [name] is a 73 y.o. female, established patient Dr. Starch, returns for 1 month follow-up of internal hemorrhoids, rectal bleeding, history of adenomatous colon polyps.  Currently on Eliquis  due to recent PEs.  She has not been anemic.  1 month ago exam showed anal fissure had resolved.  She was continued on hydrocortisone  suppository nightly to treat internal hemorrhoids.  Consider banding if no improvement.  She is actually scheduled for internal hemorrhoid banding procedure with Dr. Starch 07/22/2024.  Current Symptoms: She is continuing to have intermittent episodes of bright red blood after bowel movements in the past month.  She continues on Eliquis  due to history of recent PEs, followed by pulmonology.  She notices bright red blood in the toilet and on the tissue.  Has noticed drips of blood in the toilet.  She has not had any black stools.  Stools are brown and soft.  On 07/04/2024 I restarted her on hydrocortisone  suppository 25 Mg twice daily.  She had very slight bright red blood yesterday and none today.  She noticed a lot of increased bowel movements after she was started on CellCept  a few months ago.  She has 4 or 5 soft brown bowel movements daily.  She is not having any watery diarrhea.  She denies hard stools or straining.  Her breathing has currently improved.  Followed closely by pulmonology.  06/14/2024 lab: Normal CBC with Hgb 12.4g.  No anemia.  ________________________________________________________________  GI History: She was hospitalized 05/22/2024 until 05/30/2024 for shortness of breath.  Medical history of interstitial lung disease, CAD,  GERD, HTN, HLP chronic hypersensitive pneumonitis on CellCept  and prednisone .  Currently on oxygen  2 L.  She was found to have interstitial lung disease flareup, small bilateral pulmonary emboli, low blood clot burden, and chronic hypersensitive pneumonitis.  Has follow-up with pulmonology.  Currently on Eliquis .   She developed rectal bleeding on 05/26/2024 while in the hospital.  Eliquis  was held.  Underwent colonoscopy 05/28/2024 by Dr. Starch which showed anal fissure, internal hemorrhoids, and multiple colon polyps removed.  Pathology showed multiple tubular adenomas.  No dysplasia or malignancy.  Total of 12 polyps were removed ranging in size from 3 mm to 10 mm. Rectal bleeding was thought due to anal fissure and hemorrhoids.  Treated with Anusol  suppository and nitroglycerin  ointment.  Eliquis  was restarted after procedure.  Fortunately, her hemoglobin remained normal during hospitalization between 12 and 13 g.  Last lab 05/30/2024 showed hemoglobin 12.6.   Patient was started back on Eliquis  after hospital discharge.  On 06/04/2024 she had recurrent rectal bleeding.   Current Outpatient Medications  Medication Sig Dispense Refill   albuterol  (PROAIR  HFA) 108 (90 Base) MCG/ACT inhaler Inhale 2 puffs into the lungs every 4 (four) hours as needed for wheezing. Or coughing spells.  You may use 2 Puffs 5-10 minutes before exercise. (Patient taking differently: Inhale 2 puffs into the lungs every 4 (four) hours as needed for wheezing (or coughing. May also use 2 puffs 5-10 minutes before exercise.).) 1 each 3   albuterol  (PROVENTIL ) (2.5 MG/3ML) 0.083% nebulizer solution Take 3 mLs (2.5 mg total) by nebulization every 6 (six)  hours as needed for wheezing or shortness of breath. 360 mL 0   Alpha-D-Galactosidase (BEANO PO) Take 1 tablet by mouth as needed (as directed, if eating onion or garlic).     ALPRAZolam  (XANAX ) 0.25 MG tablet Take 0.25 mg by mouth at bedtime as needed for anxiety or sleep.      AMBULATORY NON FORMULARY MEDICATION See admin instructions. Allergy injections on Mondays and hold when sick     apixaban  (ELIQUIS ) 5 MG TABS tablet Take 1 tablet (5 mg total) by mouth 2 (two) times daily. 60 tablet 5   arformoterol  (BROVANA ) 15 MCG/2ML NEBU Take 2 mLs (15 mcg total) by nebulization 2 (two) times daily. 120 mL 0   ascorbic acid (VITAMIN C) 500 MG tablet Take 500 mg by mouth daily.     ASHWAGANDHA PO Take 1 capsule by mouth every other day.     azelastine (ASTELIN) 0.1 % nasal spray Place 1 spray into both nostrils in the morning. Use in each nostril as directed     budesonide  (PULMICORT ) 0.5 MG/2ML nebulizer solution Take 2 mLs (0.5 mg total) by nebulization in the morning and at bedtime. 120 mL 0   Calcium  Carb-Cholecalciferol (CALCIUM  500 + D PO) Take 1 tablet by mouth daily.     chlorpheniramine (CHLOR-TRIMETON) 4 MG tablet Take 2 mg by mouth 2 (two) times daily as needed for allergies or rhinitis.     cholecalciferol (VITAMIN D3) 25 MCG (1000 UNIT) tablet Take 1,000 Units by mouth every other day.     Continuous Glucose Sensor (DEXCOM G7 SENSOR) MISC Check blood sugar fasting and before meals and at bedtime. 4 each 0   dicyclomine  (BENTYL ) 10 MG capsule Take 10 mg by mouth 2 (two) times daily as needed for spasms.     diltiazem  (CARDIZEM  CD) 120 MG 24 hr capsule Take 1 capsule (120 mg total) by mouth daily. 90 capsule 3   Dupilumab  (DUPIXENT ) 300 MG/2ML SOAJ Inject 600mg  in the skin on day 0 in clinic. Then, inject 300mg  every 14 days thereafter. Courier to pulm: [address], [place] [address]. Appt on 07/17/24. 8 mL 0   EPINEPHrine  0.3 mg/0.3 mL IJ SOAJ injection Inject 0.3 mg into the muscle as needed for anaphylaxis.     ezetimibe  (ZETIA ) 10 MG tablet Take 1 tablet (10 mg total) by mouth daily. (Patient taking differently: Take 10 mg by mouth at bedtime.) 90 tablet 3   fluticasone  (FLONASE ) 50 MCG/ACT nasal spray Spray 2 sprays into each nostril every day.  (Patient taking differently: Place 2 sprays into both nostrils in the morning.) 48 g 12   Furosemide  (LASIX  PO) Take 1 tablet by mouth daily.     furosemide  (LASIX ) 20 MG tablet Take 1 tablet (20 mg total) by mouth daily. 30 tablet 2   Glucagon  (BAQSIMI  ONE PACK) 3 MG/DOSE POWD Place 1 Device into the nose as needed (Low blood sugar with impaired consciousness). 2 each 3   HUMALOG  KWIKPEN 100 UNIT/ML KwikPen Inject 8-10 Units into the skin See admin instructions. Inject 10 units into the skin before breakfast & lunch and 8 before supper     hydrocortisone  (ANUSOL -HC) 25 MG suppository Place 1 suppository (25 mg total) rectally 2 (two) times daily for 12 days. 24 suppository 0   Hypromellose (NATURES TEARS OP) Place 1 drop into both eyes in the morning and at bedtime.     Insulin  Disposable Pump (OMNIPOD 5 G7 PODS, GEN 5,) MISC Inject 1  Device into the skin every 3 (three) days.     levocetirizine (XYZAL ) 5 MG tablet Take 5 mg by mouth at bedtime.     metFORMIN  (GLUCOPHAGE -XR) 500 MG 24 hr tablet Take 500 mg by mouth daily with breakfast.     metoprolol  succinate (TOPROL -XL) 25 MG 24 hr tablet Take 1 tablet (25 mg total) by mouth daily. 90 tablet 2   montelukast  (SINGULAIR ) 10 MG tablet TAKE 1 TABLET BY MOUTH EVERYDAY AT BEDTIME (Patient taking differently: Take 10 mg by mouth at bedtime.) 90 tablet 2   Multiple Vitamin (MULTIVITAMIN) tablet Take 1 tablet by mouth daily with breakfast.     mycophenolate  (CELLCEPT ) 500 MG tablet Take 2 tablets (1,000 mg total) by mouth 2 (two) times daily. 120 tablet 0   nitroGLYCERIN  (NITROGLYN) 2 % ointment Apply 0.25 inches topically 2 (two) times daily. Apply To anus and surrounding tissue. Use pea size amount 30 g 0   nitroGLYCERIN  (NITROSTAT ) 0.4 MG SL tablet Place 1 tablet (0.4 mg total) under the tongue every 5 (five) minutes as needed for chest pain. 75 tablet 3   nystatin  cream (MYCOSTATIN ) Apply 1 Application topically 2 (two) times daily as needed (for  irritation- affected sites).     omeprazole  (PRILOSEC  OTC) 20 MG tablet Take 10 mg by mouth See admin instructions. Take 10 mg by mouth at 4:30 AM     OXYGEN  Inhale 4-5 L/min into the lungs See admin instructions. Inhale 4-5 L/min at bedtime and as needed for shortness of breath during the day     potassium chloride  (KLOR-CON  10) 10 MEQ tablet Take 1 tablet (10 mEq total) by mouth daily. 30 tablet 2   pravastatin  (PRAVACHOL ) 40 MG tablet Take 1 tablet (40 mg total) by mouth every evening. (Patient taking differently: Take 40 mg by mouth at bedtime.) 90 tablet 1   predniSONE  (DELTASONE ) 10 MG tablet 50 mg daily  x 2 weeks and then 40 mg/day and stay there till further instructions 100 tablet 1   PREPARATION H 0.25-50 % GEL Place 1 application  rectally 2 (two) times daily as needed (for hemorrhoids).     revefenacin  (YUPELRI ) 175 MCG/3ML nebulizer solution Inhale 1 vial by nebulization daily. 90 mL 5   riTUXimab -pvvr (RUXIENCE  IV) Inject into the vein. Infuse 1000mg  at Day 0 and Day 14. Repeat cycle every 6 months     sodium chloride  (OCEAN) 0.65 % SOLN nasal spray Place 1 spray into both nostrils as needed for congestion.     Sodium Chloride , Inhalant, 7 % NEBU Use per nebulizer once a day to help clear sputum 120 mL 3   sulfamethoxazole -trimethoprim  (BACTRIM  DS) 800-160 MG tablet Take 1 tablet by mouth every Monday, Wednesday, and Friday.     No current facility-administered medications for this visit.    Allergies as of 07/10/2024 - Review Complete 07/10/2024  Allergen Reaction Noted   Ozempic (0.25 or 0.5 mg-dose) [semaglutide(0.25 or 0.5mg -dos)] Other (See Comments) 03/14/2024   Tape Other (See Comments) 03/14/2024   Betadine [povidone iodine] Other (See Comments) 09/13/2012   Codeine Nausea And Vomiting 06/03/2017   Garlic Diarrhea 10/31/2018   Hydrocodone  Nausea And Vomiting 09/28/2019   Iodine Other (See Comments) 05/22/2024   Macrobid [nitrofurantoin] Other (See Comments) 03/19/2020    Ofev  [nintedanib] Other (See Comments) 03/19/2020   Onion Diarrhea 10/31/2018   Shellfish allergy Nausea And Vomiting 09/23/2012   Statins Other (See Comments) 06/03/2017   Sulfa  antibiotics Other (See Comments) 11/02/2019    Past  Medical History:  Diagnosis Date   Allergy    Arthritis    history spinal stenosis. osteoarthritis right hip   Asthma    Cataract    Coronary artery calcification seen on CAT scan 08/19/2017   >300 on CT scan 08/2017   DDD (degenerative disc disease), thoracic    Depression    DOE (dyspnea on exertion)    a. 04/2010 Lexi MV EF 71%, no ischemia/infarct;     Fatty liver    GERD (gastroesophageal reflux disease)    H/O steroid therapy    Steroid use orally over 4 yrs- for Lung Fibrosis   Heart palpitations 02/28/2015   Helicobacter pylori ab+    Hemorrhoids    Hiatal hernia    High cholesterol    History of chronic bronchitis    as child   History of migraine    History of MRSA infection    Hyperlipidemia, mixed 08/19/2017   Hyperplastic colon polyp 2007   IBS (irritable bowel syndrome)    Inguinal hernia    right   Insulin  resistance    past   Interstitial lung disease (HCC)    MVP (mitral valve prolapse)    Posterior mitral valve leaflet with mild MR   Pneumonia    Pneumonitis, hypersensitivity (HCC)    a. 09/2012 s/p Bx - ? 2/2 bird, mold, oil paint exposure ->on steroids, followed by pulm.   PONV (postoperative nausea and vomiting)    Pre-diabetes    takes Metformin    Pulmonary embolism (HCC)    Pulmonary fibrosis (HCC)    Dr. Geronimo follows- stable at present   PVC (premature ventricular contraction) 08/19/2017   Rapid heart rate    Dr. Shlomo follows- last visit Epic note 9'16   Squamous cell carcinoma of skin    Tinnitus, right ear    Vocal cord ulcer     Past Surgical History:  Procedure Laterality Date   3 HOUR PH STUDY N/A 02/21/2018   Procedure: 24 HOUR PH STUDY;  Surgeon: Shila Gustav GAILS, MD;  Location: [facility]  ENDOSCOPY;  Service: Endoscopy;  Laterality: N/A;   BREAST BIOPSY Right 2009   BIOPSY, pt denies   BREAST BIOPSY Left 2003   Benign    CATARACT EXTRACTION Left    CESAREAN SECTION     COLONOSCOPY     COLONOSCOPY N/A 05/28/2024   Procedure: COLONOSCOPY;  Surgeon: Albertus Gordy HERO, MD;  Location: [facility] ENDOSCOPY;  Service: Gastroenterology;  Laterality: N/A;   ESOPHAGEAL MANOMETRY N/A 02/21/2018   Procedure: ESOPHAGEAL MANOMETRY (EM);  Surgeon: Shila Gustav GAILS, MD;  Location: [facility] ENDOSCOPY;  Service: Endoscopy;  Laterality: N/A;   FOOT FRACTURE SURGERY  2006 or 2007   right   HYMENECTOMY     LUNG BIOPSY  09/28/2012   Procedure: LUNG BIOPSY;  Surgeon: Elspeth JAYSON Millers, MD;  Location[facility] OR;  Service: Thoracic;  Laterality: N/A;  lung biopsies tims three   RIGHT HEART CATH N/A 04/04/2024   Procedure: RIGHT HEART CATH;  Surgeon: Rolan Ezra RAMAN, MD;  Location: [facility] INVASIVE CV LAB;  Service: Cardiovascular;  Laterality: N/A;   SQUAMOUS CELL CARCINOMA EXCISION Left    left arm   TOTAL HIP ARTHROPLASTY Right 09/10/2015   Procedure: RIGHT TOTAL HIP ARTHROPLASTY ANTERIOR APPROACH;  Surgeon: Donnice Car, MD;  Location: [facility] ORS;  Service: Orthopedics;  Laterality: Right;   TOTAL HIP ARTHROPLASTY Left 10/03/2019   Procedure: TOTAL HIP ARTHROPLASTY ANTERIOR APPROACH;  Surgeon: Car Donnice, MD;  Location: [facility] ORS;  Service: Orthopedics;  Laterality: Left;  70 mins   TUBAL LIGATION     UPPER GI ENDOSCOPY     VIDEO ASSISTED THORACOSCOPY  09/28/2012   Procedure: VIDEO ASSISTED THORACOSCOPY;  Surgeon: Elspeth JAYSON Millers, MD;  Location: [facility] OR;  Service: Thoracic;  Laterality: Right;   VIDEO BRONCHOSCOPY  11/19/2011   Procedure: VIDEO BRONCHOSCOPY WITH FLUORO;  Surgeon: Dorethia Cave, MD;  Location: [facility] ENDOSCOPY;  Service: Endoscopy;;   VIDEO BRONCHOSCOPY Bilateral 05/23/2024   Procedure: VIDEO BRONCHOSCOPY WITHOUT FLUORO;  Surgeon: Mannam, Praveen, MD;  Location: [facility] ENDOSCOPY;  Service: Cardiopulmonary;   Laterality: Bilateral;    Review of Systems:    All systems reviewed and negative except where noted in HPI.    Physical Exam:  BP (!) 120/58   Pulse 78   Ht 5' (1.524 m)   Wt 144 lb (65.3 kg)   BMI 28.12 kg/m  No LMP recorded. Patient is postmenopausal.  General: Well-nourished, well-developed in no acute distress.  Lungs: Clear to auscultation bilaterally. Non-labored. Heart: Regular rate and rhythm, no murmurs rubs or gallops.  Rectal: 1 small external hemorrhoid present which is not thrombosed, not swollen, not tender, not bleeding.  Normal anal sphincter tone.  Very soft brown stool in the rectal vault.  Hemoccult positive.  No rectal tenderness or masses.  No gross red blood. Neuro: Alert and oriented x 3.  Grossly intact. She walks with NO assistive devices today. Psych: Alert and cooperative, normal mood and affect.  Chaperone for Exam:  Patty [place], CMA    Imaging Studies: No results found.  Labs: CBC    Component Value Date/Time   WBC 10.8 (H) 06/14/2024 1011   RBC 4.27 06/14/2024 1011   HGB 12.4 06/14/2024 1011   HCT 37.8 06/14/2024 1011   PLT 189.0 06/14/2024 1011   MCV 88.5 06/14/2024 1011   MCH 28.4 05/30/2024 0823   MCHC 32.9 06/14/2024 1011   RDW 20.6 (H) 06/14/2024 1011   LYMPHSABS 0.5 (L) 06/14/2024 1011   MONOABS 0.4 06/14/2024 1011   EOSABS 0.0 06/14/2024 1011   BASOSABS 0.0 06/14/2024 1011    CMP     Component Value Date/Time   NA 140 06/14/2024 1011   NA 142 12/08/2018 0917   K 3.6 06/14/2024 1011   CL 105 06/14/2024 1011   CO2 27 06/14/2024 1011   GLUCOSE 55 (L) 06/14/2024 1011   BUN 35 (H) 06/14/2024 1011   BUN 18 12/08/2018 0917   CREATININE 0.80 06/14/2024 1011   CREATININE 1.12 (H) 05/04/2024 1315   CALCIUM  9.1 06/14/2024 1011   PROT 6.2 06/14/2024 1011   PROT 6.5 12/08/2018 0917   ALBUMIN 3.9 06/14/2024 1011   ALBUMIN 3.9 12/08/2018 0917   AST 19 06/14/2024 1011   ALT 33 06/14/2024 1011   ALKPHOS 38 (L) 06/14/2024 1011    BILITOT 0.4 06/14/2024 1011   BILITOT 0.4 12/08/2018 0917   GFRNONAA >60 05/30/2024 0823   GFRNONAA 66 02/26/2015 1540   GFRAA >60 10/04/2019 0216   GFRAA 77 02/26/2015 1540       Assessment and Plan:   TEKESHA ALMGREN is a 73 y.o. y/o female returns for followup of:  1. Persistent Intermittent Bright Red Rectal Bleeding.  She has internal hemorrhoids.  Multple colon polyps removed with Colonoscopy 05/28/24.  Symptoms are NOT consistent with post polypectomy bleed..  Currently on Eliquis  (rx from pulmonologist for recent Pulmonary Emboli).  Fortunatly, she has had normal Hgb 12g to 13g with No anemia.   -  Repeat CBC - Scheduled for Internal Hemorrhoid Banding 07/22/24 with Dr. Albertus.   2.  Internal hemorrhoids - Still has episodes of bleeding which has decreased.  Heme positive today.  Currently on Eliquis . - Lab: CBC - Closely Monitor for Anemia - Continue Hydrocortisone  Suppository 25mg  at bedtime as needed. - She is Scheduled for Internal Hemorrhoid Banding with Dr. Albertus 07/22/24.   3.  12 small adenomatous polyps removed on colonoscopy 05/28/2024.  No dysplasia. - Dr. Albertus recommended 3-year repeat colonoscopy (due 05/2027).   4.  Comorbidities: Interstitial lung disease, pulmonary emboli, currently on Eliquis , oxygen , prednisone , and CellCept .  Her breathing and shortness of breath appears to be improved in the past month on current treatment. - Continue follow-up with pulmonology and PCP Dr. Burney.  NOTE: Patient was discussed with Dr. Albertus today (see Telephone Encounter) who helped decide plan of care.  Rhonda Console, PA-C  Follow up Based on Banding Procedure and GI symptoms.

## 2024-07-10 ENCOUNTER — Other Ambulatory Visit (INDEPENDENT_AMBULATORY_CARE_PROVIDER_SITE_OTHER)

## 2024-07-10 ENCOUNTER — Encounter: Payer: Self-pay | Admitting: Internal Medicine

## 2024-07-10 ENCOUNTER — Ambulatory Visit: Admitting: Physician Assistant

## 2024-07-10 ENCOUNTER — Encounter: Payer: Self-pay | Admitting: Physician Assistant

## 2024-07-10 ENCOUNTER — Other Ambulatory Visit: Payer: Self-pay

## 2024-07-10 ENCOUNTER — Ambulatory Visit: Payer: Self-pay | Admitting: Internal Medicine

## 2024-07-10 ENCOUNTER — Encounter: Payer: Self-pay | Admitting: Pharmacist

## 2024-07-10 ENCOUNTER — Telehealth: Payer: Self-pay | Admitting: Physician Assistant

## 2024-07-10 ENCOUNTER — Other Ambulatory Visit (HOSPITAL_COMMUNITY): Payer: Self-pay

## 2024-07-10 VITALS — BP 120/58 | HR 78 | Ht 60.0 in | Wt 144.0 lb

## 2024-07-10 DIAGNOSIS — K648 Other hemorrhoids: Secondary | ICD-10-CM

## 2024-07-10 DIAGNOSIS — K625 Hemorrhage of anus and rectum: Secondary | ICD-10-CM

## 2024-07-10 DIAGNOSIS — I2782 Chronic pulmonary embolism: Secondary | ICD-10-CM

## 2024-07-10 DIAGNOSIS — Z860100 Personal history of colon polyps, unspecified: Secondary | ICD-10-CM

## 2024-07-10 DIAGNOSIS — Z860101 Personal history of adenomatous and serrated colon polyps: Secondary | ICD-10-CM | POA: Diagnosis not present

## 2024-07-10 DIAGNOSIS — J849 Interstitial pulmonary disease, unspecified: Secondary | ICD-10-CM

## 2024-07-10 LAB — CBC WITH DIFFERENTIAL/PLATELET
Basophils Absolute: 0 K/uL (ref 0.0–0.1)
Basophils Relative: 0.1 % (ref 0.0–3.0)
Eosinophils Absolute: 0 K/uL (ref 0.0–0.7)
Eosinophils Relative: 0 % (ref 0.0–5.0)
HCT: 40.6 % (ref 36.0–46.0)
Hemoglobin: 13 g/dL (ref 12.0–15.0)
Lymphocytes Relative: 5.3 % — ABNORMAL LOW (ref 12.0–46.0)
Lymphs Abs: 0.7 K/uL (ref 0.7–4.0)
MCHC: 32.1 g/dL (ref 30.0–36.0)
MCV: 91.7 fl (ref 78.0–100.0)
Monocytes Absolute: 0.3 K/uL (ref 0.1–1.0)
Monocytes Relative: 2.3 % — ABNORMAL LOW (ref 3.0–12.0)
Neutro Abs: 11.9 K/uL — ABNORMAL HIGH (ref 1.4–7.7)
Neutrophils Relative %: 92.3 % — ABNORMAL HIGH (ref 43.0–77.0)
Platelets: 215 K/uL (ref 150.0–400.0)
RBC: 4.43 Mil/uL (ref 3.87–5.11)
RDW: 20 % — ABNORMAL HIGH (ref 11.5–15.5)
WBC: 12.9 K/uL — ABNORMAL HIGH (ref 4.0–10.5)

## 2024-07-10 NOTE — Patient Instructions (Signed)
 Your provider has requested that you go to the basement level for lab work before leaving today. Press B on the elevator. The lab is located at the first door on the left as you exit the elevator.  Please follow up sooner if symptoms increase or worsen  Due to recent changes in healthcare laws, you may see the results of your imaging and laboratory studies on MyChart before your provider has had a chance to review them.  We understand that in some cases there may be results that are confusing or concerning to you. Not all laboratory results come back in the same time frame and the provider may be waiting for multiple results in order to interpret others.  Please give us  48 hours in order for your provider to thoroughly review all the results before contacting the office for clarification of your results.   Thank you for trusting me with your gastrointestinal care!   Ellouise Console, PA-C _______________________________________________________  If your blood pressure at your visit was 140/90 or greater, please contact your primary care physician to follow up on this.  _______________________________________________________  If you are age 2 or older, your body mass index should be between 23-30. Your Body mass index is 28.12 kg/m. If this is out of the aforementioned range listed, please consider follow up with your Primary Care Provider.  If you are age 71 or younger, your body mass index should be between 19-25. Your Body mass index is 28.12 kg/m. If this is out of the aformentioned range listed, please consider follow up with your Primary Care Provider.   ________________________________________________________  The [facility] providers would like to encourage you to use MYCHART to communicate with providers for non-urgent requests or questions.  Due to long hold times on the telephone, sending your provider a message by [facility] may be a faster and more efficient way to get a response.   Please allow 48 business hours for a response.  Please remember that this is for non-urgent requests.  _______________________________________________________

## 2024-07-10 NOTE — Telephone Encounter (Signed)
 I would not repeat colonoscopy at this time Bleeding is likely hemorrhoidal in nature given the stability of her hemoglobin on DOAC If she was having a post polypectomy bleed on DOAC 1 would expect her to become more anemic and in fact her hemoglobin is slightly above her previous value at 13.0  It is okay to proceed with hemorrhoidal banding in the office without interruption of Eliquis  on 07/22/2024 as scheduled

## 2024-07-10 NOTE — Progress Notes (Signed)
 Specialty Pharmacy Initial Fill Coordination Note  Rhonda [name] is a 72 y.o. female contacted today regarding initial fill of specialty medication(s) Dupilumab  (Dupixent )   Patient requested Courier to Provider Office   Delivery date: 07/12/24   Verified address: [address] [place], [place] [address]   Medication will be filled on 07/11/24.   Patient is enrolled into [facility] and is aware of $0 copayment.

## 2024-07-10 NOTE — Progress Notes (Signed)
 Hgb stable. WBC high but overall trend is down and range bound. Results reassuring

## 2024-07-11 ENCOUNTER — Encounter (HOSPITAL_COMMUNITY)

## 2024-07-11 LAB — IRON,TIBC AND FERRITIN PANEL
%SAT: 53 % — ABNORMAL HIGH (ref 16–45)
Ferritin: 39 ng/mL (ref 16–288)
Iron: 195 ug/dL — ABNORMAL HIGH (ref 45–160)
TIBC: 367 ug/dL (ref 250–450)

## 2024-07-11 NOTE — Telephone Encounter (Signed)
 FYI

## 2024-07-11 NOTE — Progress Notes (Signed)
 Addendum: Reviewed and agree with assessment and management plan. Stable hemoglobin and in the setting of DOAC this is unlikely to be post polypectomy bleeding -I would expect to count and hemoglobin if there was post polypectomy hemorrhage. Hemorrhoids very likely explain continued bleeding and she would be appropriate for hemorrhoidal banding later this month as scheduled I have also discussed this with Dr. Geronimo. Zakhia Seres, Gordy HERO, MD

## 2024-07-12 ENCOUNTER — Encounter: Payer: Self-pay | Admitting: "Endocrinology

## 2024-07-13 ENCOUNTER — Encounter (HOSPITAL_COMMUNITY)

## 2024-07-13 NOTE — Progress Notes (Addendum)
 HPI Patient presents today, accompanied by spouse, to [facility] to see pharmacy team for Dupixent  new start. Past medical history includes asthma, HP and ILD currently on triple immunosuppression with prednisone , Rituxan  (next dose due Jan 2026) and CellCept . Previously failed Ofev  and Esbriet  due to side effects. Diagnosed with PE, continues on Eliquis .  Last OV with Dr. Geronimo was on 06/14/24 for hospital follow-up. Over the years she has required higher doses of steroids to prevent hypoxemic exacerbations. Each steroid taper has led to exacerbation. Admitted in early July 2025 with respiratory worsening, diagnosed with small pulmonary embolism.  See previous Dr. Geronimo telephone encounter on 05/15/24. Given responsiveness to steroids, either from active inflammation related to HP or an allergic phenotype with asthma (eosinophils greater than 300 even with prednisone ), plan to start Dupixent  for asthma allergic phenotype.   Number of hospitalizations in past year: 3, most recent hospitalization 05/22/24-05/30/24.   Today, she reports she has read about Dupixent  and feels prepared to use Dupixent . She notes that she is taking Eliquis  and has noticed easy bruising.   Respiratory medications She reports the only nebulizer she is using regularly is Yupelri  which she uses daily. She is using Breo Ellipta  inhaler. She is taking prednisone  40mg  daily. Also taking CellCept , reports using 1000mg  BID. Complains of diarrhea as a side effect.   OBJECTIVE Allergies  Allergen Reactions   Ozempic (0.25 Or 0.5 Mg-Dose) [Semaglutide(0.25 Or 0.5mg -Dos)] Other (See Comments)    Paralyzed the stomach   Tape Other (See Comments)    SKIN IS VERY FRAGILE!! Burns and pulls up the skin!! SKIN BRUISES and TEARS EASILY.SABRA   Betadine [Povidone Iodine] Other (See Comments)    Blisters    Codeine Nausea And Vomiting   Garlic Diarrhea   Hydrocodone  Nausea And Vomiting   Iodine Other (See Comments)     Blisters    Macrobid [Nitrofurantoin] Other (See Comments)    Instructed by dr not to take    Ofev  [Nintedanib] Other (See Comments)    Abdominal pain   Onion Diarrhea   Shellfish Allergy Nausea And Vomiting   Statins Other (See Comments)    Leg pain, but Pravachol  is tolerated   Sulfa  Antibiotics Other (See Comments)    Headaches     Outpatient Encounter Medications as of 07/17/2024  Medication Sig Note   albuterol  (PROAIR  HFA) 108 (90 Base) MCG/ACT inhaler Inhale 2 puffs into the lungs every 4 (four) hours as needed for wheezing. Or coughing spells.  You may use 2 Puffs 5-10 minutes before exercise. (Patient taking differently: Inhale 2 puffs into the lungs every 4 (four) hours as needed for wheezing (or coughing. May also use 2 puffs 5-10 minutes before exercise.).)    albuterol  (PROVENTIL ) (2.5 MG/3ML) 0.083% nebulizer solution Take 3 mLs (2.5 mg total) by nebulization every 6 (six) hours as needed for wheezing or shortness of breath.    Alpha-D-Galactosidase (BEANO PO) Take 1 tablet by mouth as needed (as directed, if eating onion or garlic).    ALPRAZolam  (XANAX ) 0.25 MG tablet Take 0.25 mg by mouth at bedtime as needed for anxiety or sleep.    AMBULATORY NON FORMULARY MEDICATION See admin instructions. Allergy injections on Mondays and hold when sick    apixaban  (ELIQUIS ) 5 MG TABS tablet Take 1 tablet (5 mg total) by mouth 2 (two) times daily.    arformoterol  (BROVANA ) 15 MCG/2ML NEBU Take 2 mLs (15 mcg total) by nebulization 2 (two) times daily.    ascorbic acid (  VITAMIN C) 500 MG tablet Take 500 mg by mouth daily.    ASHWAGANDHA PO Take 1 capsule by mouth every other day.    azelastine (ASTELIN) 0.1 % nasal spray Place 1 spray into both nostrils in the morning. Use in each nostril as directed    budesonide  (PULMICORT ) 0.5 MG/2ML nebulizer solution Take 2 mLs (0.5 mg total) by nebulization in the morning and at bedtime.    Calcium  Carb-Cholecalciferol (CALCIUM  500 + D PO) Take  1 tablet by mouth daily.    chlorpheniramine (CHLOR-TRIMETON) 4 MG tablet Take 2 mg by mouth 2 (two) times daily as needed for allergies or rhinitis.    cholecalciferol (VITAMIN D3) 25 MCG (1000 UNIT) tablet Take 1,000 Units by mouth every other day.    Continuous Glucose Sensor (DEXCOM G7 SENSOR) MISC Check blood sugar fasting and before meals and at bedtime.    dicyclomine  (BENTYL ) 10 MG capsule Take 10 mg by mouth 2 (two) times daily as needed for spasms.    diltiazem  (CARDIZEM  CD) 120 MG 24 hr capsule Take 1 capsule (120 mg total) by mouth daily.    Dupilumab  (DUPIXENT ) 300 MG/2ML SOAJ Inject 600mg  in the skin on day 0 in clinic. Then, inject 300mg  every 14 days thereafter. Courier to pulm: [address], [place] [address]. Appt on 07/17/24.    EPINEPHrine  0.3 mg/0.3 mL IJ SOAJ injection Inject 0.3 mg into the muscle as needed for anaphylaxis.    ezetimibe  (ZETIA ) 10 MG tablet Take 1 tablet (10 mg total) by mouth daily. (Patient taking differently: Take 10 mg by mouth at bedtime.)    fluticasone  (FLONASE ) 50 MCG/ACT nasal spray Spray 2 sprays into each nostril every day. (Patient taking differently: Place 2 sprays into both nostrils in the morning.)    Furosemide  (LASIX  PO) Take 1 tablet by mouth daily.    furosemide  (LASIX ) 20 MG tablet Take 1 tablet (20 mg total) by mouth daily.    Glucagon  (BAQSIMI  ONE PACK) 3 MG/DOSE POWD Place 1 Device into the nose as needed (Low blood sugar with impaired consciousness).    HUMALOG  KWIKPEN 100 UNIT/ML KwikPen Inject 8-10 Units into the skin See admin instructions. Inject 10 units into the skin before breakfast & lunch and 8 before supper    hydrocortisone  (ANUSOL -HC) 25 MG suppository Place 1 suppository (25 mg total) rectally 2 (two) times daily for 12 days.    Hypromellose (NATURES TEARS OP) Place 1 drop into both eyes in the morning and at bedtime.    levocetirizine (XYZAL ) 5 MG tablet Take 5 mg by mouth at bedtime.    metFORMIN   (GLUCOPHAGE -XR) 500 MG 24 hr tablet Take 500 mg by mouth daily with breakfast.    metoprolol  succinate (TOPROL -XL) 25 MG 24 hr tablet Take 1 tablet (25 mg total) by mouth daily.    montelukast  (SINGULAIR ) 10 MG tablet TAKE 1 TABLET BY MOUTH EVERYDAY AT BEDTIME (Patient taking differently: Take 10 mg by mouth at bedtime.)    Multiple Vitamin (MULTIVITAMIN) tablet Take 1 tablet by mouth daily with breakfast.    mycophenolate  (CELLCEPT ) 500 MG tablet Take 2 tablets (1,000 mg total) by mouth 2 (two) times daily.    nitroGLYCERIN  (NITROGLYN) 2 % ointment Apply 0.25 inches topically 2 (two) times daily. Apply To anus and surrounding tissue. Use pea size amount    nitroGLYCERIN  (NITROSTAT ) 0.4 MG SL tablet Place 1 tablet (0.4 mg total) under the tongue every 5 (five) minutes as needed for chest pain.  nystatin  cream (MYCOSTATIN ) Apply 1 Application topically 2 (two) times daily as needed (for irritation- affected sites).    omeprazole  (PRILOSEC  OTC) 20 MG tablet Take 10 mg by mouth See admin instructions. Take 10 mg by mouth at 4:30 AM 03/14/2024: 0.5 tablet = 10 mg   OXYGEN  Inhale 4-5 L/min into the lungs See admin instructions. Inhale 4-5 L/min at bedtime and as needed for shortness of breath during the day    potassium chloride  (KLOR-CON  10) 10 MEQ tablet Take 1 tablet (10 mEq total) by mouth daily.    pravastatin  (PRAVACHOL ) 40 MG tablet Take 1 tablet (40 mg total) by mouth every evening. (Patient taking differently: Take 40 mg by mouth at bedtime.)    predniSONE  (DELTASONE ) 10 MG tablet 50 mg daily  x 2 weeks and then 40 mg/day and stay there till further instructions    PREPARATION H 0.25-50 % GEL Place 1 application  rectally 2 (two) times daily as needed (for hemorrhoids).    revefenacin  (YUPELRI ) 175 MCG/3ML nebulizer solution Inhale 1 vial by nebulization daily.    riTUXimab -pvvr (RUXIENCE  IV) Inject into the vein. Infuse 1000mg  at Day 0 and Day 14. Repeat cycle every 6 months    sodium chloride   (OCEAN) 0.65 % SOLN nasal spray Place 1 spray into both nostrils as needed for congestion.    Sodium Chloride , Inhalant, 7 % NEBU Use per nebulizer once a day to help clear sputum    sulfamethoxazole -trimethoprim  (BACTRIM  DS) 800-160 MG tablet Take 1 tablet by mouth every Monday, Wednesday, and Friday.    No facility-administered encounter medications on file as of 07/17/2024.     Immunization History  Administered Date(s) Administered   Fluad Quad(high Dose 65+) 07/17/2019   Hepatitis A 05/23/2010   Hepatitis B 06/23/2006   Influenza Split 08/11/2012, 08/14/2017   Influenza Whole 09/08/2011   Influenza, High Dose Seasonal PF 08/24/2016, 07/24/2017, 08/31/2018, 08/15/2020, 09/26/2020, 09/12/2021, 08/24/2022   Influenza,inj,Quad PF,6+ Mos 09/07/2013   Influenza-Unspecified 09/26/2014, 07/27/2015, 08/24/2023   MMR 05/23/2010   Moderna Sars-Covid-2 Vaccination 05/07/2021, 07/30/2021, 01/01/2022   PFIZER(Purple Top)SARS-COV-2 Vaccination 12/16/2019, 01/08/2020, 07/18/2020, 08/11/2022   Pfizer(Comirnaty)Fall Seasonal Vaccine 12 years and older 07/24/2023   Pneumococcal Conjugate-13 03/15/2014   Pneumococcal Polysaccharide-23 01/11/2018, 09/26/2020, 08/26/2021   Respiratory Syncytial Virus Vaccine,Recomb Aduvanted(Arexvy) 07/28/2022   Td 02/22/2003   Tdap 10/28/2012   Zoster Recombinant(Shingrix) 02/15/2017, 05/17/2017   Zoster, Live 12/04/2010     PFTs    Latest Ref Rng & Units 06/14/2024    8:19 AM 04/05/2024    8:21 AM 10/01/2023    9:49 AM 06/04/2023   10:12 AM 03/08/2023   10:49 AM 12/09/2022   11:48 AM 07/21/2022    9:50 AM  PFT Results  FVC-Pre L 1.48  1.52  1.43  1.44  1.45  1.53  1.54   FVC-Predicted Pre % 63  64  60  60  64  66  67   Pre FEV1/FVC % % 89  87  92  91  93  87  91   FEV1-Pre L 1.32  1.33  1.31  1.30  1.35  1.32  1.40   FEV1-Predicted Pre % 75  76  73  73  80  77  81   DLCO uncorrected ml/min/mmHg 7.71  8.29  9.73  10.82  8.11  9.64  8.16   DLCO UNC% % 46  49   58  64  50  59  50   DLCO corrected ml/min/mmHg 7.89  8.24  9.73  10.82  7.81  9.41  7.86   DLCO COR %Predicted % 47  49  58  64  48  58  48   DLVA Predicted % 76  78  81  85  65  81  64   TLC L     3.02     TLC % Predicted %     70     RV % Predicted %     70        Eosinophils In the past, blood eosinophil count was 300 cells/microL taken on 05/12/24.    Assessment   Biologics training for dupilumab  (Dupixent )  Goals of therapy: Mechanism: human monoclonal IgG4 antibody that inhibits interleukin-4 and interleukin-13 cytokine-induced responses, including release of proinflammatory cytokines, chemokines, and IgE Reviewed that Dupixent  is add-on medication and patient must continue maintenance inhaler regimen. Response to therapy: may take 4 months to determine efficacy. Discussed that patients generally feel improvement sooner than 4 months.  Side effects: injection site reaction (6-18%), antibody development (5-16%), ophthalmic conjunctivitis (2-16%), transient blood eosinophilia (1-2%)  Dose: 600mg  at Week 0 (administered today in clinic) followed by 300mg  every 14 days thereafter  Administration/Storage:  Reviewed administration sites of thigh or abdomen (at least 2-3 inches away from abdomen). Reviewed the upper arm is only appropriate if caregiver is administering injection  Do not shake pen/syringe as this could lead to product foaming or precipitation. Do not use if solution is discolored or contains particulate matter or if window on prefilled pen is yellow (indicates pen has been used).  Reviewed storage of medication in refrigerator. Reviewed that Dupixent  can be stored at room temperature in unopened carton for up to 14 days.  Access: Approval of Dupixent  through: insurance and grant Patient enrolled into copay card program to help with copay assistance.  Patient self-administered Dupixent  300mg /75ml x 2 (total dose 600mg ) in right upper thigh and left upper thigh using  [facility]-supplied medication.   Dupixent  300mg /59mL autoinjector pen NDC: [phone] Lot: [id number] Expiration: 2026-07-23  Patient monitored for 30 minutes for adverse reaction.  Patient noted burning with administration of medication, but resolved after injection completed. After injection, tolerated well. Slight drop of blood. Bandaids provided.  Patient denies itchiness and irritation at injection.  PLAN Continue Dupixent  300mg  every 14 days.  Next dose is due 07/31/24 and every 14 days thereafter. Rx sent to: [facility]: [phone] .  Patient provided with pharmacy phone number.  Continue other medications as prescribed by Dr. Geronimo and pulmonology team. Dupixent  does not replace other medications. Patient has appointment today with Candis Dandy in pulmonology directly following this appointment.  Thereafter, follow-up with Dr. Geronimo as planned on 09/14/24.   All questions encouraged and answered.  Instructed patient to reach out with any further questions or concerns.  Thank you for allowing pharmacy to participate in this patient's care.  This appointment required 45 minutes of patient care (this includes precharting, chart review, review of results, face-to-face care, etc.).

## 2024-07-14 NOTE — Patient Instructions (Addendum)
 Your next Dupixent  dose is due on 07/31/24, 08/14/24, and every 14 days thereafter. You will use the contents of one pen (300mg ) for each dose going forward.   Store Dupixent  in the refrigerator. It can be kept at room temperature for up to 14 days.   CONTINUE other medications prescribed by Dr. Geronimo and your pulmonology team. Dupixent  does not replace other medications on your list.   Your prescription will be shipped from [facility]. Their phone number is [phone]. Someone will call to schedule shipment and confirm address. They will mail your medication to your home.  You will need to be seen by your provider in 3 to 4 months to assess how Dupixent  is working for you. Please keep your follow-up appointment scheduled on 09/14/24 with Dr. Geronimo.   Stay up to date on all routine vaccines: influenza, pneumonia, COVID19, Shingles  How to manage an injection site reaction: Remember the 5 C's: COUNTER - leave on the counter at least 30 minutes but up to overnight to bring medication to room temperature. This may help prevent stinging COLD - place something cold (like an ice gel pack or cold water  bottle) on the injection site just before cleansing with alcohol. This may help reduce pain CLARITIN  - use Claritin  (generic name is loratadine ) for the first two weeks of treatment or the day of, the day before, and the day after injecting. This will help to minimize injection site reactions CORTISONE CREAM - apply if injection site is irritated and itching CALL ME - if injection site reaction is bigger than the size of your fist, looks infected, blisters, or if you develop hives

## 2024-07-17 ENCOUNTER — Ambulatory Visit (INDEPENDENT_AMBULATORY_CARE_PROVIDER_SITE_OTHER)

## 2024-07-17 ENCOUNTER — Ambulatory Visit: Admitting: Pharmacist

## 2024-07-17 ENCOUNTER — Other Ambulatory Visit: Payer: Self-pay

## 2024-07-17 ENCOUNTER — Telehealth: Payer: Self-pay

## 2024-07-17 VITALS — BP 119/72 | HR 71 | Temp 97.9°F | Ht 60.0 in | Wt 145.0 lb

## 2024-07-17 DIAGNOSIS — J4530 Mild persistent asthma, uncomplicated: Secondary | ICD-10-CM

## 2024-07-17 DIAGNOSIS — Z7189 Other specified counseling: Secondary | ICD-10-CM

## 2024-07-17 DIAGNOSIS — I2699 Other pulmonary embolism without acute cor pulmonale: Secondary | ICD-10-CM | POA: Diagnosis not present

## 2024-07-17 DIAGNOSIS — J849 Interstitial pulmonary disease, unspecified: Secondary | ICD-10-CM | POA: Diagnosis not present

## 2024-07-17 DIAGNOSIS — K649 Unspecified hemorrhoids: Secondary | ICD-10-CM | POA: Diagnosis not present

## 2024-07-17 DIAGNOSIS — J9611 Chronic respiratory failure with hypoxia: Secondary | ICD-10-CM | POA: Diagnosis not present

## 2024-07-17 DIAGNOSIS — I272 Pulmonary hypertension, unspecified: Secondary | ICD-10-CM

## 2024-07-17 MED ORDER — DUPIXENT 300 MG/2ML ~~LOC~~ SOAJ
300.0000 mg | SUBCUTANEOUS | 2 refills | Status: DC
  Filled 2024-07-17 – 2024-08-18 (×2): qty 4, 28d supply, fill #0
  Filled 2024-09-15: qty 4, 28d supply, fill #1
  Filled 2024-10-11 – 2024-11-29 (×2): qty 4, 28d supply, fill #2

## 2024-07-17 NOTE — Assessment & Plan Note (Signed)
-   Continue Eliquis

## 2024-07-17 NOTE — Patient Instructions (Addendum)
 Continue Yupelri  daily.  Continue Albuterol  as needed.  Continue Dupixent , Eliquis , Cellcept , Prednisone  (taper as prescribed)  Follow up in 6 weeks; return sooner if new or worsening symptoms.  See attached information regarding healthy eating in diabetes.

## 2024-07-17 NOTE — Assessment & Plan Note (Signed)
-    Follows closely with GI -  Hemorrhoidal banding scheduled for Thursday which will help

## 2024-07-17 NOTE — Assessment & Plan Note (Signed)
-    Continue supportive oxygen  as needed

## 2024-07-17 NOTE — Assessment & Plan Note (Addendum)
-    Continue Yupelri  daily; Albuterol  prn -  Taper steroids as able; Plan to slow taper with goal to get down to 10mg  daily within 2 months. -  Follow on Dupixent

## 2024-07-17 NOTE — Telephone Encounter (Signed)
 MyChart message sent to patient regarding Prednisone  taper as below:  Okay to start tapering now:  Change current dosing of Prednisone  to 30 mg daily x 3 weeks; then decrease to 20 mg daily x3 weeks; then decrease to 10 mg daily.    Goal is to be at Prednisone  10 mg daily within 8 weeks.

## 2024-07-17 NOTE — Assessment & Plan Note (Signed)
-    Continue Cellcept  -  Continue Rituxan

## 2024-07-17 NOTE — Progress Notes (Signed)
 @Patient  ID: Rhonda [name], female    DOB: Dec 14, 1950, 73 y.o.   MRN: [id number]  No chief complaint on file.   Referring provider: Burney Darice CROME, MD  HPI: Rhonda [name] is a 73 year old female with past medical history of interstitial lung disease, chronic respiratory failure with hypoxia on home oxygen , and recent diagnosis of pulmonary embolisms on Eliquis  therapy who presents today for follow-up.  She was diagnosed with bilateral pulmonary embolisms a little over a month ago and started on oral Eliquis .  She has struggled with rectal bleeding on and off since then and follows closely with GI.  She has a hemorrhoidal banding procedure scheduled for this Thursday.  Her main struggle currently is with fluctuating blood sugars and fluctuating peripheral leg swelling.  She is currently on prednisone  40 mg daily in addition to CellCept .  She reports no worsening of her respiratory baseline at present.  She denies fever, chills, productive cough, night sweats, weight loss, chest pain.  She feels that her rectal bleeding has almost completely stopped and she remains on Eliquis  daily.  She started Dupixent  injections today with our Pharmacy team.  TEST/EVENTS :   Allergies  Allergen Reactions   Ozempic (0.25 Or 0.5 Mg-Dose) [Semaglutide(0.25 Or 0.5mg -Dos)] Other (See Comments)    Paralyzed the stomach   Tape Other (See Comments)    SKIN IS VERY FRAGILE!! Burns and pulls up the skin!! SKIN BRUISES and TEARS EASILY.SABRA   Betadine [Povidone Iodine] Other (See Comments)    Blisters    Codeine Nausea And Vomiting   Garlic Diarrhea   Hydrocodone  Nausea And Vomiting   Iodine Other (See Comments)    Blisters    Macrobid [Nitrofurantoin] Other (See Comments)    Instructed by dr not to take    Ofev  [Nintedanib] Other (See Comments)    Abdominal pain   Onion Diarrhea   Shellfish Allergy Nausea And Vomiting   Statins Other (See Comments)    Leg pain, but Pravachol  is tolerated    Sulfa  Antibiotics Other (See Comments)    Headaches     Immunization History  Administered Date(s) Administered   Fluad Quad(high Dose 65+) 07/17/2019   Hepatitis A 05/23/2010   Hepatitis B 06/23/2006   INFLUENZA, HIGH DOSE SEASONAL PF 08/24/2016, 07/24/2017, 08/31/2018, 08/15/2020, 09/26/2020, 09/12/2021, 08/24/2022   Influenza Split 08/11/2012, 08/14/2017   Influenza Whole 09/08/2011   Influenza,inj,Quad PF,6+ Mos 09/07/2013   Influenza-Unspecified 09/26/2014, 07/27/2015, 08/24/2023   MMR 05/23/2010   Moderna Sars-Covid-2 Vaccination 05/07/2021, 07/30/2021, 01/01/2022   PFIZER(Purple Top)SARS-COV-2 Vaccination 12/16/2019, 01/08/2020, 07/18/2020, 08/11/2022   Pfizer(Comirnaty)Fall Seasonal Vaccine 12 years and older 07/24/2023   Pneumococcal Conjugate-13 03/15/2014   Pneumococcal Polysaccharide-23 01/11/2018, 09/26/2020, 08/26/2021   Respiratory Syncytial Virus Vaccine,Recomb Aduvanted(Arexvy) 07/28/2022   Td 02/22/2003   Tdap 10/28/2012   Zoster Recombinant(Shingrix) 02/15/2017, 05/17/2017   Zoster, Live 12/04/2010    Past Medical History:  Diagnosis Date   Allergy    Arthritis    history spinal stenosis. osteoarthritis right hip   Asthma    Cataract    Coronary artery calcification seen on CAT scan 08/19/2017   >300 on CT scan 08/2017   DDD (degenerative disc disease), thoracic    Depression    DOE (dyspnea on exertion)    a. 04/2010 Lexi MV EF 71%, no ischemia/infarct;     Fatty liver    GERD (gastroesophageal reflux disease)    H/O steroid therapy    Steroid use orally over 4 yrs- for Lung Fibrosis  Heart palpitations 02/28/2015   Helicobacter pylori ab+    Hemorrhoids    Hiatal hernia    High cholesterol    History of chronic bronchitis    as child   History of migraine    History of MRSA infection    Hyperlipidemia, mixed 08/19/2017   Hyperplastic colon polyp 2007   IBS (irritable bowel syndrome)    Inguinal hernia    right   Insulin  resistance     past   Interstitial lung disease (HCC)    MVP (mitral valve prolapse)    Posterior mitral valve leaflet with mild MR   Pneumonia    Pneumonitis, hypersensitivity (HCC)    a. 09/2012 s/p Bx - ? 2/2 bird, mold, oil paint exposure ->on steroids, followed by pulm.   PONV (postoperative nausea and vomiting)    Pre-diabetes    takes Metformin    Pulmonary embolism (HCC)    Pulmonary fibrosis (HCC)    Dr. Geronimo follows- stable at present   PVC (premature ventricular contraction) 08/19/2017   Rapid heart rate    Dr. Shlomo follows- last visit Epic note 9'16   Squamous cell carcinoma of skin    Tinnitus, right ear    Vocal cord ulcer     Tobacco History: Social History   Tobacco Use  Smoking Status Never  Smokeless Tobacco Never  Tobacco Comments   pt states she experimented in college   Counseling given: Not Answered Tobacco comments: pt states she experimented in college   Outpatient Medications Prior to Visit  Medication Sig Dispense Refill   albuterol  (PROAIR  HFA) 108 (90 Base) MCG/ACT inhaler Inhale 2 puffs into the lungs every 4 (four) hours as needed for wheezing. Or coughing spells.  You may use 2 Puffs 5-10 minutes before exercise. (Patient taking differently: Inhale 2 puffs into the lungs every 4 (four) hours as needed for wheezing (or coughing. May also use 2 puffs 5-10 minutes before exercise.).) 1 each 3   albuterol  (PROVENTIL ) (2.5 MG/3ML) 0.083% nebulizer solution Take 3 mLs (2.5 mg total) by nebulization every 6 (six) hours as needed for wheezing or shortness of breath. 360 mL 0   Alpha-D-Galactosidase (BEANO PO) Take 1 tablet by mouth as needed (as directed, if eating onion or garlic).     ALPRAZolam  (XANAX ) 0.25 MG tablet Take 0.25 mg by mouth at bedtime as needed for anxiety or sleep.     AMBULATORY NON FORMULARY MEDICATION See admin instructions. Allergy injections on Mondays and hold when sick     apixaban  (ELIQUIS ) 5 MG TABS tablet Take 1 tablet (5 mg total)  by mouth 2 (two) times daily. 60 tablet 5   arformoterol  (BROVANA ) 15 MCG/2ML NEBU Take 2 mLs (15 mcg total) by nebulization 2 (two) times daily. 120 mL 0   ascorbic acid (VITAMIN C) 500 MG tablet Take 500 mg by mouth daily.     ASHWAGANDHA PO Take 1 capsule by mouth every other day.     azelastine (ASTELIN) 0.1 % nasal spray Place 1 spray into both nostrils in the morning. Use in each nostril as directed     budesonide  (PULMICORT ) 0.5 MG/2ML nebulizer solution Take 2 mLs (0.5 mg total) by nebulization in the morning and at bedtime. 120 mL 0   Calcium  Carb-Cholecalciferol (CALCIUM  500 + D PO) Take 1 tablet by mouth daily.     chlorpheniramine (CHLOR-TRIMETON) 4 MG tablet Take 2 mg by mouth 2 (two) times daily as needed for allergies or rhinitis.  cholecalciferol (VITAMIN D3) 25 MCG (1000 UNIT) tablet Take 1,000 Units by mouth every other day.     Continuous Glucose Sensor (DEXCOM G7 SENSOR) MISC Check blood sugar fasting and before meals and at bedtime. 4 each 0   dicyclomine  (BENTYL ) 10 MG capsule Take 10 mg by mouth 2 (two) times daily as needed for spasms.     diltiazem  (CARDIZEM  CD) 120 MG 24 hr capsule Take 1 capsule (120 mg total) by mouth daily. 90 capsule 3   EPINEPHrine  0.3 mg/0.3 mL IJ SOAJ injection Inject 0.3 mg into the muscle as needed for anaphylaxis.     ezetimibe  (ZETIA ) 10 MG tablet Take 1 tablet (10 mg total) by mouth daily. (Patient taking differently: Take 10 mg by mouth at bedtime.) 90 tablet 3   fluticasone  (FLONASE ) 50 MCG/ACT nasal spray Spray 2 sprays into each nostril every day. (Patient taking differently: Place 2 sprays into both nostrils in the morning.) 48 g 12   Furosemide  (LASIX  PO) Take 1 tablet by mouth daily.     furosemide  (LASIX ) 20 MG tablet Take 1 tablet (20 mg total) by mouth daily. 30 tablet 2   Glucagon  (BAQSIMI  ONE PACK) 3 MG/DOSE POWD Place 1 Device into the nose as needed (Low blood sugar with impaired consciousness). 2 each 3   HUMALOG  KWIKPEN 100  UNIT/ML KwikPen Inject 8-10 Units into the skin See admin instructions. Inject 10 units into the skin before breakfast & lunch and 8 before supper     Hypromellose (NATURES TEARS OP) Place 1 drop into both eyes in the morning and at bedtime.     levocetirizine (XYZAL ) 5 MG tablet Take 5 mg by mouth at bedtime.     metFORMIN  (GLUCOPHAGE -XR) 500 MG 24 hr tablet Take 500 mg by mouth daily with breakfast.     metoprolol  succinate (TOPROL -XL) 25 MG 24 hr tablet Take 1 tablet (25 mg total) by mouth daily. 90 tablet 2   montelukast  (SINGULAIR ) 10 MG tablet TAKE 1 TABLET BY MOUTH EVERYDAY AT BEDTIME (Patient taking differently: Take 10 mg by mouth at bedtime.) 90 tablet 2   Multiple Vitamin (MULTIVITAMIN) tablet Take 1 tablet by mouth daily with breakfast.     mycophenolate  (CELLCEPT ) 500 MG tablet Take 2 tablets (1,000 mg total) by mouth 2 (two) times daily. 120 tablet 0   nitroGLYCERIN  (NITROGLYN) 2 % ointment Apply 0.25 inches topically 2 (two) times daily. Apply To anus and surrounding tissue. Use pea size amount 30 g 0   nitroGLYCERIN  (NITROSTAT ) 0.4 MG SL tablet Place 1 tablet (0.4 mg total) under the tongue every 5 (five) minutes as needed for chest pain. 75 tablet 3   nystatin  cream (MYCOSTATIN ) Apply 1 Application topically 2 (two) times daily as needed (for irritation- affected sites).     omeprazole  (PRILOSEC  OTC) 20 MG tablet Take 10 mg by mouth See admin instructions. Take 10 mg by mouth at 4:30 AM     OXYGEN  Inhale 4-5 L/min into the lungs See admin instructions. Inhale 4-5 L/min at bedtime and as needed for shortness of breath during the day     potassium chloride  (KLOR-CON  10) 10 MEQ tablet Take 1 tablet (10 mEq total) by mouth daily. 30 tablet 2   pravastatin  (PRAVACHOL ) 40 MG tablet Take 1 tablet (40 mg total) by mouth every evening. (Patient taking differently: Take 40 mg by mouth at bedtime.) 90 tablet 1   predniSONE  (DELTASONE ) 10 MG tablet 50 mg daily  x 2 weeks and then 40  mg/day and  stay there till further instructions 100 tablet 1   PREPARATION H 0.25-50 % GEL Place 1 application  rectally 2 (two) times daily as needed (for hemorrhoids).     revefenacin  (YUPELRI ) 175 MCG/3ML nebulizer solution Inhale 1 vial by nebulization daily. 90 mL 5   riTUXimab -pvvr (RUXIENCE  IV) Inject into the vein. Infuse 1000mg  at Day 0 and Day 14. Repeat cycle every 6 months     sodium chloride  (OCEAN) 0.65 % SOLN nasal spray Place 1 spray into both nostrils as needed for congestion.     Sodium Chloride , Inhalant, 7 % NEBU Use per nebulizer once a day to help clear sputum 120 mL 3   sulfamethoxazole -trimethoprim  (BACTRIM  DS) 800-160 MG tablet Take 1 tablet by mouth every Monday, Wednesday, and Friday.     Dupilumab  (DUPIXENT ) 300 MG/2ML SOAJ Inject 600mg  in the skin on day 0 in clinic. Then, inject 300mg  every 14 days thereafter. Courier to pulm: [address], [address], [place] [address]. Appt on 07/17/24. 8 mL 0   No facility-administered medications prior to visit.     Review of Systems:   Constitutional:   No  weight loss, night sweats,  Fevers, chills, fatigue, or  lassitude.  HEENT:   No headaches,  Difficulty swallowing,  Tooth/dental problems, or  Sore throat,                No sneezing, itching, ear ache, nasal congestion, post nasal drip,   CV:  No chest pain,  Orthopnea, PND,  anasarca, dizziness, palpitations, syncope. + lower extremity edema  GI  No heartburn, indigestion, abdominal pain, nausea, vomiting, diarrhea, change in bowel habits, loss of appetite, bloody stools.   Resp: No shortness of breath with exertion or at rest.  No excess mucus, no productive cough,  No non-productive cough,  No coughing up of blood.  No change in color of mucus.  No wheezing.  No chest wall deformity  Skin: no rash or lesions.  GU: no dysuria, change in color of urine, no urgency or frequency.  No flank pain, no hematuria   MS:  No joint pain or swelling.  No decreased range of motion.   No back pain.    Physical Exam  BP 119/72   Pulse 71   Temp 97.9 F (36.6 C) (Temporal)   Ht 5' (1.524 m)   Wt 145 lb (65.8 kg)   SpO2 95%   BMI 28.32 kg/m   GEN: A/Ox3; pleasant , NAD, well nourished    HEENT:  [place]/AT,  EACs-clear, TMs-wnl, NOSE-clear, THROAT-clear, no lesions, no postnasal drip or exudate noted.   NECK:  Supple w/ fair ROM; no JVD; normal carotid impulses w/o bruits; no thyromegaly or nodules palpated; no lymphadenopathy.    RESP  Clear  P & A; w/o, wheezes or rhonchi. no accessory muscle use, no dullness to percussion  + bibasilar crackles  CARD:  RRR, no m/r/g, pitting 1+ peripheral edema, pulses intact, no cyanosis or clubbing.  GI:   Soft & nt; nml bowel sounds; no organomegaly or masses detected.   Musco: Warm bil, no deformities or joint swelling noted.   Neuro: alert, no focal deficits noted.    Skin: Warm, no rashes; few petechiae over arms/legs    Lab Results:  CBC    Component Value Date/Time   WBC 12.9 (H) 07/10/2024 1418   RBC 4.43 07/10/2024 1418   HGB 13.0 07/10/2024 1418   HCT 40.6 07/10/2024 1418  PLT 215.0 07/10/2024 1418   MCV 91.7 07/10/2024 1418   MCH 28.4 05/30/2024 0823   MCHC 32.1 07/10/2024 1418   RDW 20.0 (H) 07/10/2024 1418   LYMPHSABS 0.7 07/10/2024 1418   MONOABS 0.3 07/10/2024 1418   EOSABS 0.0 07/10/2024 1418   BASOSABS 0.0 07/10/2024 1418    BMET    Component Value Date/Time   NA 140 06/14/2024 1011   NA 142 12/08/2018 0917   K 3.6 06/14/2024 1011   CL 105 06/14/2024 1011   CO2 27 06/14/2024 1011   GLUCOSE 55 (L) 06/14/2024 1011   BUN 35 (H) 06/14/2024 1011   BUN 18 12/08/2018 0917   CREATININE 0.80 06/14/2024 1011   CREATININE 1.12 (H) 05/04/2024 1315   CALCIUM  9.1 06/14/2024 1011   GFRNONAA >60 05/30/2024 0823   GFRNONAA 66 02/26/2015 1540   GFRAA >60 10/04/2019 0216   GFRAA 77 02/26/2015 1540    BNP    Component Value Date/Time   BNP 89.7 05/22/2024 1302    ProBNP    Component  Value Date/Time   PROBNP 167.0 (H) 06/14/2024 1011    Imaging: No results found.  acetaminophen  (TYLENOL ) tablet 650 mg     Date Action Dose Route User   06/12/2024 0841 Given 650 mg Oral Batten, Leita BRAVO, LPN      diphenhydrAMINE  (BENADRYL ) capsule 50 mg     Date Action Dose Route User   06/12/2024 [phone] Given 50 mg Oral Batten, Leita BRAVO, LPN      methylPREDNISolone  sodium succinate (SOLU-MEDROL ) 125 mg/2 mL injection 125 mg     Date Action Dose Route User   06/12/2024 0859 Given 125 mg Intravenous BattenLeita BRAVO, LPN      riTUXimab -pvvr (RUXIENCE ) 1,000 mg in sodium chloride  0.9 % 250 mL infusion     Date Action Dose Route User   06/12/2024 1051 Rate/Dose Change (none) Intravenous Janit Rocky BRAVO, RN   06/12/2024 1025 Rate/Dose Change (none) Intravenous Rannie Leita BRAVO, LPN   2/78/7974 9044 Rate/Dose Change (none) Intravenous Rannie Leita BRAVO, LPN   2/78/7974 9082 New Bag/Given 1,000 mg Intravenous Janit Rocky BRAVO, RN          Latest Ref Rng & Units 06/14/2024    8:19 AM 04/05/2024    8:21 AM 10/01/2023    9:49 AM 06/04/2023   10:12 AM 03/08/2023   10:49 AM 12/09/2022   11:48 AM 07/21/2022    9:50 AM  PFT Results  FVC-Pre L 1.48  1.52  1.43  1.44  1.45  1.53  1.54   FVC-Predicted Pre % 63  64  60  60  64  66  67   Pre FEV1/FVC % % 89  87  92  91  93  87  91   FEV1-Pre L 1.32  1.33  1.31  1.30  1.35  1.32  1.40   FEV1-Predicted Pre % 75  76  73  73  80  77  81   DLCO uncorrected ml/min/mmHg 7.71  8.29  9.73  10.82  8.11  9.64  8.16   DLCO UNC% % 46  49  58  64  50  59  50   DLCO corrected ml/min/mmHg 7.89  8.24  9.73  10.82  7.81  9.41  7.86   DLCO COR %Predicted % 47  49  58  64  48  58  48   DLVA Predicted % 76  78  81  85  65  81  64   TLC L     3.02     TLC % Predicted %     70     RV % Predicted %     70       Lab Results  Component Value Date   NITRICOXIDE 10 01/13/2016     Assessment & Plan:   Assessment & Plan ILD (interstitial lung disease) (HCC) -   Continue Cellcept  -  Continue Rituxan  Chronic respiratory failure with hypoxia (HCC) -  Continue supportive oxygen  as needed Acute pulmonary embolism without acute cor pulmonale, unspecified pulmonary embolism type (HCC) -  Continue Eliquis  Bleeding hemorrhoids -  Follows closely with GI -  Hemorrhoidal banding scheduled for Thursday which will help Pulmonary HTN (HCC)  Mild persistent asthma, unspecified whether complicated -  Continue Yupelri  daily; Albuterol  prn -  Taper steroids as able; Plan to slow taper with goal to get down to 10mg  daily within 2 months. -  Follow on Dupixent    Return in about 6 weeks (around 08/28/2024).  Keep appt as scheduled with Dr. Geronimo in October.  Candis Dandy, PA-C 07/17/2024

## 2024-07-18 ENCOUNTER — Encounter: Payer: Self-pay | Admitting: "Endocrinology

## 2024-07-18 ENCOUNTER — Encounter (HOSPITAL_COMMUNITY)

## 2024-07-18 ENCOUNTER — Ambulatory Visit: Payer: Self-pay | Admitting: Internal Medicine

## 2024-07-18 NOTE — Progress Notes (Signed)
 No CO2 retention on ABG. Good news

## 2024-07-19 ENCOUNTER — Other Ambulatory Visit

## 2024-07-19 NOTE — Telephone Encounter (Signed)
 I see that the patient has an appt on 9/4 but Dr. Dartha needed to see her sooner to discuss her regimen. Please reach out for a sooner appt Per Dr. Dartha.

## 2024-07-20 ENCOUNTER — Telehealth: Admitting: "Endocrinology

## 2024-07-20 ENCOUNTER — Encounter: Payer: Self-pay | Admitting: Cardiology

## 2024-07-20 ENCOUNTER — Encounter (HOSPITAL_COMMUNITY)

## 2024-07-20 ENCOUNTER — Encounter: Payer: Self-pay | Admitting: "Endocrinology

## 2024-07-20 ENCOUNTER — Ambulatory Visit: Admitting: Internal Medicine

## 2024-07-20 ENCOUNTER — Ambulatory Visit: Payer: Self-pay | Admitting: Internal Medicine

## 2024-07-20 ENCOUNTER — Encounter: Payer: Self-pay | Admitting: Internal Medicine

## 2024-07-20 VITALS — HR 95 | Ht 60.0 in | Wt 146.0 lb

## 2024-07-20 DIAGNOSIS — E7800 Pure hypercholesterolemia, unspecified: Secondary | ICD-10-CM | POA: Diagnosis not present

## 2024-07-20 DIAGNOSIS — K641 Second degree hemorrhoids: Secondary | ICD-10-CM

## 2024-07-20 DIAGNOSIS — E1165 Type 2 diabetes mellitus with hyperglycemia: Secondary | ICD-10-CM | POA: Diagnosis not present

## 2024-07-20 DIAGNOSIS — Z7984 Long term (current) use of oral hypoglycemic drugs: Secondary | ICD-10-CM | POA: Diagnosis not present

## 2024-07-20 DIAGNOSIS — Z794 Long term (current) use of insulin: Secondary | ICD-10-CM | POA: Diagnosis not present

## 2024-07-20 DIAGNOSIS — Z860100 Personal history of colon polyps, unspecified: Secondary | ICD-10-CM

## 2024-07-20 DIAGNOSIS — K625 Hemorrhage of anus and rectum: Secondary | ICD-10-CM

## 2024-07-20 DIAGNOSIS — K602 Anal fissure, unspecified: Secondary | ICD-10-CM

## 2024-07-20 DIAGNOSIS — K529 Noninfective gastroenteritis and colitis, unspecified: Secondary | ICD-10-CM

## 2024-07-20 DIAGNOSIS — Z860101 Personal history of adenomatous and serrated colon polyps: Secondary | ICD-10-CM

## 2024-07-20 DIAGNOSIS — K648 Other hemorrhoids: Secondary | ICD-10-CM

## 2024-07-20 DIAGNOSIS — Z7901 Long term (current) use of anticoagulants: Secondary | ICD-10-CM

## 2024-07-20 DIAGNOSIS — K6289 Other specified diseases of anus and rectum: Secondary | ICD-10-CM

## 2024-07-20 LAB — LIPID PANEL
Cholesterol: 220 mg/dL — ABNORMAL HIGH (ref <200)
HDL: 94 mg/dL (ref >=50)
LDL Cholesterol (Calc): 102 mg/dL — ABNORMAL HIGH
Non-HDL Cholesterol (Calc): 126 mg/dL (ref <130)
Total CHOL/HDL Ratio: 2.3 (calc) (ref <5.0)
Triglycerides: 147 mg/dL (ref <150)

## 2024-07-20 LAB — COMPREHENSIVE METABOLIC PANEL WITH GFR
AG Ratio: 2.1 (calc) (ref 1.0–2.5)
ALT: 27 U/L (ref 6–29)
AST: 18 U/L (ref 10–35)
Albumin: 3.7 g/dL (ref 3.6–5.1)
Alkaline phosphatase (APISO): 38 U/L (ref 37–153)
BUN/Creatinine Ratio: 35 (calc) — ABNORMAL HIGH (ref 6–22)
BUN: 29 mg/dL — ABNORMAL HIGH (ref 7–25)
CO2: 30 mmol/L (ref 20–32)
Calcium: 8.8 mg/dL (ref 8.6–10.4)
Chloride: 105 mmol/L (ref 98–110)
Creat: 0.84 mg/dL (ref 0.60–1.00)
Globulin: 1.8 g/dL — ABNORMAL LOW (ref 1.9–3.7)
Glucose, Bld: 109 mg/dL — ABNORMAL HIGH (ref 65–99)
Potassium: 3.5 mmol/L (ref 3.5–5.3)
Sodium: 142 mmol/L (ref 135–146)
Total Bilirubin: 0.4 mg/dL (ref 0.2–1.2)
Total Protein: 5.5 g/dL — ABNORMAL LOW (ref 6.1–8.1)
eGFR: 72 mL/min/1.73m2 (ref >=60)

## 2024-07-20 LAB — MICROALBUMIN / CREATININE URINE RATIO
Creatinine, Urine: 147 mg/dL (ref 20–275)
Microalb Creat Ratio: 13 mg/g{creat} (ref <30)
Microalb, Ur: 1.9 mg/dL

## 2024-07-20 NOTE — Patient Instructions (Signed)
 HEMORRHOID BANDING PROCEDURE    FOLLOW-UP CARE   The procedure you have had should have been relatively painless since the banding of the area involved does not have nerve endings and there is no pain sensation.  The rubber band cuts off the blood supply to the hemorrhoid and the band may fall off as soon as 48 hours after the banding (the band may occasionally be seen in the toilet bowl following a bowel movement). You may notice a temporary feeling of fullness in the rectum which should respond adequately to plain Tylenol or Motrin.  Following the banding, avoid strenuous exercise that evening and resume full activity the next day.  A sitz bath (soaking in a warm tub) or bidet is soothing, and can be useful for cleansing the area after bowel movements.     To avoid constipation, take two tablespoons of natural wheat bran, natural oat bran, flax, Benefiber or any over the counter fiber supplement and increase your water intake to 7-8 glasses daily.    Unless you have been prescribed anorectal medication, do not put anything inside your rectum for two weeks: No suppositories, enemas, fingers, etc.  Occasionally, you may have more bleeding than usual after the banding procedure.  This is often from the untreated hemorrhoids rather than the treated one.  Don't be concerned if there is a tablespoon or so of blood.  If there is more blood than this, lie flat with your bottom higher than your head and apply an ice pack to the area. If the bleeding does not stop within a half an hour or if you feel faint, call our office at [phone] or go to the emergency room.  Problems are not common; however, if there is a substantial amount of bleeding, severe pain, chills, fever or difficulty passing urine (very rare) or other problems, you should call us  at [phone] or report to the nearest emergency room.  Do not stay seated continuously for more than 2-3 hours for a day or two after the procedure.   Tighten your buttock muscles 10-15 times every two hours and take 10-15 deep breaths every 1-2 hours.  Do not spend more than a few minutes on the toilet if you cannot empty your bowel; instead re-visit the toilet at a later time.

## 2024-07-20 NOTE — Progress Notes (Unsigned)
 {Choose 1 Note Type (Video or Telephone):[phone]}    Date:  07/20/2024   ID:  TAMYIA MINICH, DOB Jun 20, 1951, MRN [id number] The patient was identified using 2 identifiers.  {Patient [phone]::Home} {Provider [phone]::Home Office}   PCP:  Burney Darice CROME, MD   [facility] Providers Cardiologist:  None { Click to update primary MD,subspecialty MD or APP then REFRESH:1}    Evaluation Performed:  {Choose Visit Type:[phone]::Follow-Up Visit}  Chief Complaint:  ***  History of Present Illness:    Rhonda [name] is a 73 y.o. female with who presents for follow up of coronary calcium  and PVCs.  She had a normal nuclear stress test in 2020.  She has premature ventricular contractions.  She had a normal echo in   She has chronic interstitial lung disease.  She does have home O2.  Her calcium  score was 343.    She had a normal echo in Nov 2022.     She returns for follow up.  ***   *** She has had increasing problems with interstitial lung disease and currently is on 20 mg of prednisone  a day.  She has backed herself down a little bit and is working with Dr. Geronimo for management of this.  She was going to get a right heart cath as part of her clinical trial but she decided not to do this.  I do see echocardiograms from last year that do not demonstrate RV dysfunction there is only minimally elevated pulmonary pressures.  She denies chest pressure, neck or arm discomfort.  She is not having any of palpitations which she was having.  She is not having any PND or orthopnea.  She has had no leg swelling.   Past Medical History:  Diagnosis Date   Allergy    Arthritis    history spinal stenosis. osteoarthritis right hip   Asthma    Cataract    Coronary artery calcification seen on CAT scan 08/19/2017   >300 on CT scan 08/2017   DDD (degenerative disc disease), thoracic    Depression    DOE (dyspnea on exertion)    a. 04/2010 Lexi MV EF  71%, no ischemia/infarct;     Fatty liver    GERD (gastroesophageal reflux disease)    H/O steroid therapy    Steroid use orally over 4 yrs- for Lung Fibrosis   Heart palpitations 02/28/2015   Helicobacter pylori ab+    Hemorrhoids    Hiatal hernia    High cholesterol    History of chronic bronchitis    as child   History of migraine    History of MRSA infection    Hyperlipidemia, mixed 08/19/2017   Hyperplastic colon polyp 2007   IBS (irritable bowel syndrome)    Inguinal hernia    right   Insulin  resistance    past   Interstitial lung disease (HCC)    MVP (mitral valve prolapse)    Posterior mitral valve leaflet with mild MR   Pneumonia    Pneumonitis, hypersensitivity (HCC)    a. 09/2012 s/p Bx - ? 2/2 bird, mold, oil paint exposure ->on steroids, followed by pulm.   PONV (postoperative nausea and vomiting)    Pre-diabetes    takes Metformin    Pulmonary embolism (HCC)    Pulmonary fibrosis (HCC)    Dr. Geronimo follows- stable at present   PVC (premature ventricular contraction) 08/19/2017   Rapid heart rate    Dr. Shlomo follows- last visit Epic note [phone]  Squamous cell carcinoma of skin    Tinnitus, right ear    Vocal cord ulcer    Past Surgical History:  Procedure Laterality Date   67 HOUR PH STUDY N/A 02/21/2018   Procedure: 24 HOUR PH STUDY;  Surgeon: Shila Gustav GAILS, MD;  Location: [facility];  Service: Endoscopy;  Laterality: N/A;   BREAST BIOPSY Right 2009   BIOPSY, pt denies   BREAST BIOPSY Left 2003   Benign    CATARACT EXTRACTION Left    CESAREAN SECTION     COLONOSCOPY     COLONOSCOPY N/A 05/28/2024   Procedure: COLONOSCOPY;  Surgeon: Albertus Gordy HERO, MD;  Location: [facility];  Service: Gastroenterology;  Laterality: N/A;   ESOPHAGEAL MANOMETRY N/A 02/21/2018   Procedure: ESOPHAGEAL MANOMETRY (EM);  Surgeon: Shila Gustav GAILS, MD;  Location: [facility];  Service: Endoscopy;  Laterality: N/A;   FOOT FRACTURE SURGERY  2006 or 2007   right    HYMENECTOMY     LUNG BIOPSY  09/28/2012   Procedure: LUNG BIOPSY;  Surgeon: Elspeth JAYSON Millers, MD;  Location: [facility] OR;  Service: Thoracic;  Laterality: N/A;  lung biopsies tims three   RIGHT HEART CATH N/A 04/04/2024   Procedure: RIGHT HEART CATH;  Surgeon: Rolan Ezra RAMAN, MD;  Location: [facility] INVASIVE CV LAB;  Service: Cardiovascular;  Laterality: N/A;   SQUAMOUS CELL CARCINOMA EXCISION Left    left arm   TOTAL HIP ARTHROPLASTY Right 09/10/2015   Procedure: RIGHT TOTAL HIP ARTHROPLASTY ANTERIOR APPROACH;  Surgeon: Donnice Car, MD;  Location: [facility] ORS;  Service: Orthopedics;  Laterality: Right;   TOTAL HIP ARTHROPLASTY Left 10/03/2019   Procedure: TOTAL HIP ARTHROPLASTY ANTERIOR APPROACH;  Surgeon: Car Donnice, MD;  Location: [facility];  Service: Orthopedics;  Laterality: Left;  70 mins   TUBAL LIGATION     UPPER GI ENDOSCOPY     VIDEO ASSISTED THORACOSCOPY  09/28/2012   Procedure: VIDEO ASSISTED THORACOSCOPY;  Surgeon: Elspeth JAYSON Millers, MD;  Location: [facility] OR;  Service: Thoracic;  Laterality: Right;   VIDEO BRONCHOSCOPY  11/19/2011   Procedure: VIDEO BRONCHOSCOPY WITH FLUORO;  Surgeon: Dorethia Cave, MD;  Location: [facility] ENDOSCOPY;  Service: Endoscopy;;   VIDEO BRONCHOSCOPY Bilateral 05/23/2024   Procedure: VIDEO BRONCHOSCOPY WITHOUT FLUORO;  Surgeon: Mannam, Praveen, MD;  Location: [facility];  Service: Cardiopulmonary;  Laterality: Bilateral;     No outpatient medications have been marked as taking for the 07/21/24 encounter (Appointment) with Lavona Agent, MD.     Allergies:   Ozempic (0.25 or 0.5 mg-dose) [semaglutide(0.25 or 0.5mg -dos)], Tape, Betadine [povidone iodine], Codeine, Garlic, Hydrocodone , Iodine, Macrobid [nitrofurantoin], Ofev  [nintedanib], Onion, Shellfish allergy, Statins, and Sulfa  antibiotics   Social History   Tobacco Use   Smoking status: Never   Smokeless tobacco: Never   Tobacco comments:    pt states she experimented in college  Vaping Use   Vaping status:  Never Used  Substance Use Topics   Alcohol use: Not Currently    Alcohol/week: 2.0 standard drinks of alcohol    Types: 1 Glasses of wine, 1 Standard drinks or equivalent per week   Drug use: No     Family Hx: The patient's family history includes Allergic rhinitis in her daughter; Asthma in her maternal grandmother and mother; Bone cancer in her paternal grandfather; Breast cancer in her cousin, cousin, cousin, paternal aunt, and paternal aunt; Dementia (age of onset: 72) in her mother; Diabetes in her father; Emphysema in her maternal grandmother; Heart disease in her sister; Hypertension  in her sister; Kidney disease in her sister; Lung disease in her maternal grandfather; Lymphoma in her father; Osteoarthritis in her mother; Ovarian cancer in her maternal aunt. There is no history of Colon cancer, Angioedema, Eczema, Immunodeficiency, or Urticaria.  ROS:   Please see the history of present illness.    *** All other systems reviewed and are negative.   Prior CV studies:   The following studies were reviewed today:  ***  Labs/Other Tests and Data Reviewed:    EKG:  {EKG/Telemetry Strips Reviewed:[phone]}  Recent Labs: 01/04/2024: TSH 0.92 05/22/2024: B Natriuretic Peptide 89.7 05/28/2024: Magnesium  2.6 06/14/2024: Pro B Natriuretic peptide (BNP) 167.0 07/10/2024: Hemoglobin 13.0; Platelets 215.0 07/19/2024: ALT 27; BUN 29; Creat 0.84; Potassium 3.5; Sodium 142   Recent Lipid Panel Lab Results  Component Value Date/Time   CHOL 220 (H) 07/19/2024 08:39 AM   CHOL 177 12/08/2018 09:17 AM   TRIG 147 07/19/2024 08:39 AM   HDL 94 07/19/2024 08:39 AM   HDL 68 12/08/2018 09:17 AM   CHOLHDL 2.3 07/19/2024 08:39 AM   LDLCALC 102 (H) 07/19/2024 08:39 AM    Wt Readings from Last 3 Encounters:  07/20/24 146 lb (66.2 kg)  07/17/24 145 lb (65.8 kg)  07/10/24 144 lb (65.3 kg)     Risk Assessment/Calculations:   {Does this patient have ATRIAL FIBRILLATION?[phone]}       Objective:    Vital Signs:  There were no vitals taken for this visit.   {[facility] Virtual Exam (Optional):[phone]::VITAL SIGNS:  reviewed}  ASSESSMENT & PLAN:      Coronary artery calcifications on CT:   ***  The patient does not have any new chest discomfort.  She had a negative perfusion study in 2020.  No change in therapy.   Palpitations: *** She has PACs.  She does not feel her palpitations.  She is doing well on the Cardizem .  No change in therapy.   Dyslipidemia:    ***LDL was 61 with and HDL of 76.  No change in therapy.     {Are you ordering a CV Procedure (e.g. stress test, cath, DCCV, TEE, etc)?   Press F2        :[phone]}     Time:   Today, I have spent *** minutes with the patient with telehealth technology discussing the above problems.     Medication Adjustments/Labs and Tests Ordered: Current medicines are reviewed at length with the patient today.  Concerns regarding medicines are outlined above.   Tests Ordered: No orders of the defined types were placed in this encounter.   Medication Changes: No orders of the defined types were placed in this encounter.   Follow Up:  {F/U Format:[phone]} {follow up:15908}  Signed, Lynwood Schilling, MD  07/20/2024 2:17 PM    [facility]

## 2024-07-20 NOTE — Patient Instructions (Signed)
 Will recommend the following: Metformin  XR 500 mg daily 22 units of Lantus . Decrease by 1 unit if blood sugar is less than 100 overnight.   Humalog  10 units before break fast and 12 units before lunch and 4-6 units before supper. Decrease by 1 unit if blood sugar is less than 100 in the daytime.   Humalog  Correction scale: Use in addition to your meal time/short acting insulin  based on blood sugars as follows:  151 - 190: 1 unit 191 - 230: 2 units 231 - 270: 3 units 271 - 310: 4 units 311 - 350: 5 units 351 - 390: 6 units 391 - 430: 7 units

## 2024-07-20 NOTE — Progress Notes (Signed)
 Subjective:    Patient ID: Rhonda [name], female    DOB: Apr 30, 1951, 73 y.o.   MRN: [id number]  HPI Rhonda [name] is a 73 year old female with symptomatic bleeding internal hemorrhoids who presents for possible hemorrhoidal banding.  She has been experiencing persistent intermittent bright red blood per rectum, which has become more regular but has improved in the last few days, with only a little blood on the toilet paper. A colonoscopy on May 28, 2024, for persistent rectal bleeding revealed an anal fissure and multiple polyps throughout the colon, including a 9 mm cecal polyp, two subcentimeter ascending colon polyps, three subcentimeter transverse polyps, a subcentimeter descending polyp, two subcentimeter sigmoid polyps, two subcentimeter rectosigmoid polyps, and a one centimeter rectal polyp. All polyps were removed and found to be tubular adenomas. Medium-sized internal hemorrhoids were also noted.  She was seen on July 10, 2024, for persistent bleeding in the setting of Eliquis  use, which she is on due to recent pulmonary emboli. She also remains on oxygen  for interstitial lung disease and is taking prednisone  and CellCept . Her CBC on July 10, 2024, showed a hemoglobin of 13, MCV of 91.7, platelet count of 215, and a white count of 12.9. Her ferritin was 39, percent saturation 53, TIBC normal, and iron level elevated at 195.  She has stopped dairy intake three days ago, which has reduced her urgency and frequency of bowel movements, previously up to ten times a day, and resolved her stomach pain. She experiences swelling in her right foot, which is unusual as it is typically the left foot that swells. She attributes this to sleeping on her right side and has not taken her diuretic today to avoid frequent urination.  She was treated with anal fissure with nitroglycerin  but was really only applying this topically externally.  Some still pain with passing stool  Review of  Systems As per HPI, otherwise negative  Current Medications, Allergies, Past Medical History, Past Surgical History, Family History and Social History were reviewed in [employer] record.    Objective:   Physical Exam Pulse 95   Ht 5' (1.524 m)   Wt 146 lb (66.2 kg)   BMI 28.51 kg/m  Gen: awake, alert, NAD HEENT: anicteric  Abd: soft, NT/ND, +BS throughout Rectal: Small posterior lateral anal fissure, visible prolapsed internal hemorrhoids, no masses or external rash on DRE Ext: no c/c/e Neuro: nonfocal     Latest Ref Rng & Units 07/10/2024    2:18 PM 06/14/2024   10:11 AM 06/09/2024    9:47 AM  CBC  WBC 4.0 - 10.5 K/uL 12.9  10.8  19.2   Hemoglobin 12.0 - 15.0 g/dL 86.9  87.5  87.2   Hematocrit 36.0 - 46.0 % 40.6  37.8  39.2   Platelets 150.0 - 400.0 K/uL 215.0  189.0  167.0    LABS CBC: Hb 13, MCV 91.7, PLT 215, WBC 12.9 (07/10/2024) Ferritin: 39 (07/10/2024) Iron: 195 (07/10/2024)  RADIOLOGY CT abdomen and pelvis with contrast: No lymphadenopathy or metastatic disease in chest, abdomen, or pelvis. Moderate pulmonary fibrosis unchanged, trace pleural effusions, mild cardiomegaly, CED. Normal stomach and bowel without thickening, distention, or inflammation. (05/24/2024)  DIAGNOSTIC Colonoscopy: Anal fissure, 9 mm cecal polyp, 2 subcentimeter ascending colon polyps, 3 subcentimeter transverse polyps, subcentimeter descending polyp, two subcentimeter sigmoid polyps, two subcentimeter rectosigmoid polyps, one centimeter rectal polyp, medium sized internal hemorrhoids at retroflexion (05/28/2024)  PATHOLOGY Tubular adenomas (05/28/2024)  Assessment & Plan:   Symptomatic internal hemorrhoids with rectal bleeding on anticoagulation Intermittent rectal bleeding linked to Eliquis  use. Medium-sized internal hemorrhoids noted. Banding effective with minimal bleeding risk, no need to stop Eliquis . - Proceed with hemorrhoidal banding, one vessel at a  time due to anticoagulation. - Monitor for post-banding bleeding, especially through day 14. - Educated on signs of post-banding bleeding and when to seek care. - Continue Eliquis .   PROCEDURE NOTE:  The patient presents with symptomatic grade 2 internal hemorrhoids, requesting rubber band ligation of  her hemorrhoidal disease.  All risks, benefits and alternative forms of therapy were described and informed consent was obtained.   The anorectum was pre-medicated with 0.125% nitroglycerin  ointment The decision was made to band the RP internal hemorrhoid, and the [facility] System was used to perform band ligation without complication.   Digital anorectal examination was then performed to assure proper positioning of the band, and to adjust the banded tissue as required.  The patient was discharged home without pain or other issues.  Dietary and behavioral recommendations were given and along with follow-up instructions.     The patient will return as scheduled for follow-up and possible additional banding as required. No complications were encountered and the patient tolerated the procedure well.   Anal fissure Anal fissure noted with mild pain during bowel movements. Potential irritation from banding procedure. - Continue using nitroglycerin  ointment, applying a pea-sized amount past the anal resistance to approximately the first knuckle 2-3 times daily until healed and then 1 week longer  Chronic diarrhea, improved with dairy elimination Chronic diarrhea improved with dairy elimination. Stomach pain resolved. - Continue dairy elimination diet. - Monitor for return of symptoms without dairy and report if symptoms recur.  History of multiple adenomatous polyps -3-year surveillance interval recommended

## 2024-07-20 NOTE — Progress Notes (Signed)
 The patient reports they are currently: [place]. I spent 7-8 minutes on the video with the patient on the date of service. I spent an additional 5 minutes on pre- and post-visit activities on the date of service.   The patient was physically located in [place]  or a state in which I am permitted to provide care. The patient and/or parent/guardian understood that s/he may incur co-pays and cost sharing, and agreed to the telemedicine visit. The visit was reasonable and appropriate under the circumstances given the patient's presentation at the time.  The patient and/or parent/guardian has been advised of the potential risks and limitations of this mode of treatment (including, but not limited to, the absence of in-person examination) and has agreed to be treated using telemedicine. The patient's/patient's family's questions regarding telemedicine have been answered.   The patient and/or parent/guardian has also been advised to contact their provider's office for worsening conditions, and seek emergency medical treatment and/or call 911 if the patient deems either necessary.    Outpatient Endocrinology Note Rhonda Birmingham, MD  07/20/24   Rhonda [name] [birth year] [id number]  Referring Provider: Burney Darice CROME, MD Primary Care Provider: Burney Darice CROME, MD Reason for consultation: Subjective   Assessment & Plan  Diagnoses and all orders for this visit:  Uncontrolled type 2 diabetes mellitus with hyperglycemia (HCC)  Long-term insulin  use (HCC)  Long term (current) use of oral hypoglycemic drugs  Pure hypercholesterolemia   Diabetes Type II complicated by steroid worsened hyperglycemia,  Lab Results  Component Value Date   GFR 72.14 06/14/2024   Hba1c goal less than 7, current Hba1c is 7.2 on 06/25/24 Lab Results  Component Value Date   HGBA1C 7.2 (H) 03/15/2024   Will recommend the following: Metformin  XR 500 mg daily 22 units of Lantus . Decrease by 1 unit if blood  sugar is less than 100 overnight.   Humalog  10 units before break fast and 12 units before lunch and 4-6 units before supper. Decrease by 1 unit if blood sugar is less than 100 in the daytime.   Humalog  Correction scale: Use in addition to your meal time/short acting insulin  based on blood sugars as follows:  151 - 190: 1 unit 191 - 230: 2 units 231 - 270: 3 units 271 - 310: 4 units 311 - 350: 5 units 351 - 390: 6 units 391 - 430: 7 units   Skip Humalog  if the blood sugar is less than 70 before meal. Cut the dose of Humalog  in half if the blood sugar is between 71-100 before.  Currently on 40 mg of prednisone  daily No known contraindications/side effects to any of above medications Glucagon  discussed and prescribed with refills on 07/20/24 Was taken off of insulin  pump due to steroid dose adjustments/ 2 hospitalizations  -Last LD and Tg are as follows: Lab Results  Component Value Date   LDLCALC 102 (H) 07/19/2024    Lab Results  Component Value Date   TRIG 147 07/19/2024   -On pravastatin  40 mg every day (has no S/E) and ezetimibe  10 mg daily. Reports intolerance to stronger statins. Sees cardiologist.  -I recommend pravastatin  80 mg every day -Follow low fat diet and exercise   -Blood pressure goal <140/90 - Microalbumin/creatinine goal is < 30 -Last MA/Cr is as follows: Lab Results  Component Value Date   MICROALBUR 1.9 07/19/2024   -not on ACE/ARB? -diet changes including salt restriction -limit eating outside -counseled BP targets per standards of diabetes care -uncontrolled blood pressure  can lead to retinopathy, nephropathy and cardiovascular and atherosclerotic heart disease  Reviewed and counseled on: -A1C target -Blood sugar targets -Complications of uncontrolled diabetes  -Checking blood sugar before meals and bedtime and bring log next visit -All medications with mechanism of action and side effects -Hypoglycemia management: rule of 15's, Glucagon   Emergency Kit and medical alert ID -low-carb low-fat plate-method diet -At least 20 minutes of physical activity per day -Annual dilated retinal eye exam and foot exam -compliance and follow up needs -follow up as scheduled or earlier if problem gets worse  Call if blood sugar is less than 70 or consistently above 250    Take a 15 gm snack of carbohydrate at bedtime before you go to sleep if your blood sugar is less than 100.    If you are going to fast after midnight for a test or procedure, ask your physician for instructions on how to reduce/decrease your insulin  dose.    Call if blood sugar is less than 70 or consistently above 250  -Treating a low sugar by rule of 15  (15 gms of sugar every 15 min until sugar is more than 70) If you feel your sugar is low, test your sugar to be sure If your sugar is low (less than 70), then take 15 grams of a fast acting Carbohydrate (3-4 glucose tablets or glucose gel or 4 ounces of juice or regular soda) Recheck your sugar 15 min after treating low to make sure it is more than 70 If sugar is still less than 70, treat again with 15 grams of carbohydrate          Don't drive the hour of hypoglycemia  If unconscious/unable to eat or drink by mouth, use glucagon  injection or nasal spray baqsimi  and call 911. Can repeat again in 15 min if still unconscious.  Return in about 3 months (around 10/20/2024).   I have reviewed current medications, nurse's notes, allergies, vital signs, past medical and surgical history, family medical history, and social history for this encounter. Counseled patient on symptoms, examination findings, lab findings, imaging results, treatment decisions and monitoring and prognosis. The patient understood the recommendations and agrees with the treatment plan. All questions regarding treatment plan were fully answered.  Rhonda Birmingham, MD  07/20/24    History of Present Illness Rhonda [name] is a 73 y.o. year old female  who presents for follow up of Type II diabetes mellitus.  Rhonda [name] was first diagnosed in 2017.   Diabetes education +  Home diabetes regimen: Currently on 40 mg of prednisone  daily 22 units of Lantus   Humalog  10 units before break fast and 12 units before lunch and 4-6 units before supper Humalog  Correction scale: Use in addition to your meal time/short acting insulin  based on blood sugars as follows:  151 - 190: 1 unit 191 - 230: 2 units 231 - 270: 3 units 271 - 310: 4 units 311 - 350: 5 units 351 - 390: 6 units 391 - 430: 7 units   Metformin    COMPLICATIONS -  MI/Stroke -  retinopathy -  neuropathy -  nephropathy  BLOOD SUGAR DATA CGM interpretation: At today's visit, we reviewed her CGM downloads. The full report is scanned in the media. Reviewing the CGM trends, BG are well controlled most of the day, with some highs.   Physical Exam  There were no vitals taken for this visit.   Constitutional: well developed, well nourished Head: normocephalic, atraumatic Eyes: sclera  anicteric, no redness Neck: supple Lungs: normal respiratory effort Neurology: alert and oriented Skin: dry, no appreciable rashes Musculoskeletal: no appreciable defects Psychiatric: normal mood and affect Diabetic Foot Exam - Simple   No data filed      Current Medications Patient's Medications  New Prescriptions   No medications on file  Previous Medications   ALBUTEROL  (PROAIR  HFA) 108 (90 BASE) MCG/ACT INHALER    Inhale 2 puffs into the lungs every 4 (four) hours as needed for wheezing. Or coughing spells.  You may use 2 Puffs 5-10 minutes before exercise.   ALBUTEROL  (PROVENTIL ) (2.5 MG/3ML) 0.083% NEBULIZER SOLUTION    Take 3 mLs (2.5 mg total) by nebulization every 6 (six) hours as needed for wheezing or shortness of breath.   ALPHA-D-GALACTOSIDASE (BEANO PO)    Take 1 tablet by mouth as needed (as directed, if eating onion or garlic).   ALPRAZOLAM  (XANAX ) 0.25 MG TABLET     Take 0.25 mg by mouth at bedtime as needed for anxiety or sleep.   AMBULATORY NON FORMULARY MEDICATION    See admin instructions. Allergy injections on Mondays and hold when sick   APIXABAN  (ELIQUIS ) 5 MG TABS TABLET    Take 1 tablet (5 mg total) by mouth 2 (two) times daily.   ARFORMOTEROL  (BROVANA ) 15 MCG/2ML NEBU    Take 2 mLs (15 mcg total) by nebulization 2 (two) times daily.   ASCORBIC ACID (VITAMIN C) 500 MG TABLET    Take 500 mg by mouth daily.   ASHWAGANDHA PO    Take 1 capsule by mouth every other day.   AZELASTINE (ASTELIN) 0.1 % NASAL SPRAY    Place 1 spray into both nostrils in the morning. Use in each nostril as directed   BUDESONIDE  (PULMICORT ) 0.5 MG/2ML NEBULIZER SOLUTION    Take 2 mLs (0.5 mg total) by nebulization in the morning and at bedtime.   CALCIUM  CARB-CHOLECALCIFEROL (CALCIUM  500 + D PO)    Take 1 tablet by mouth daily.   CHLORPHENIRAMINE (CHLOR-TRIMETON) 4 MG TABLET    Take 2 mg by mouth 2 (two) times daily as needed for allergies or rhinitis.   CHOLECALCIFEROL (VITAMIN D3) 25 MCG (1000 UNIT) TABLET    Take 1,000 Units by mouth every other day.   CONTINUOUS GLUCOSE SENSOR (DEXCOM G7 SENSOR) MISC    Check blood sugar fasting and before meals and at bedtime.   DICYCLOMINE  (BENTYL ) 10 MG CAPSULE    Take 10 mg by mouth 2 (two) times daily as needed for spasms.   DILTIAZEM  (CARDIZEM  CD) 120 MG 24 HR CAPSULE    Take 1 capsule (120 mg total) by mouth daily.   DUPILUMAB  (DUPIXENT ) 300 MG/2ML SOAJ    Inject 300 mg into the skin every 14 (fourteen) days.   EPINEPHRINE  0.3 MG/0.3 ML IJ SOAJ INJECTION    Inject 0.3 mg into the muscle as needed for anaphylaxis.   EZETIMIBE  (ZETIA ) 10 MG TABLET    Take 1 tablet (10 mg total) by mouth daily.   FLUTICASONE  (FLONASE ) 50 MCG/ACT NASAL SPRAY    Spray 2 sprays into each nostril every day.   FUROSEMIDE  (LASIX  PO)    Take 1 tablet by mouth daily.   FUROSEMIDE  (LASIX ) 20 MG TABLET    Take 1 tablet (20 mg total) by mouth daily.   GLUCAGON   (BAQSIMI  ONE PACK) 3 MG/DOSE POWD    Place 1 Device into the nose as needed (Low blood sugar with impaired consciousness).   HUMALOG  [facility]  100 UNIT/ML KWIKPEN    Inject 8-10 Units into the skin See admin instructions. Inject 10 units into the skin before breakfast & lunch and 8 before supper   HYPROMELLOSE (NATURES TEARS OP)    Place 1 drop into both eyes in the morning and at bedtime.   LANTUS  SOLOSTAR 100 UNIT/ML SOLOSTAR PEN    Inject 100 Units into the skin daily.   LEVOCETIRIZINE (XYZAL ) 5 MG TABLET    Take 5 mg by mouth at bedtime.   METFORMIN  (GLUCOPHAGE -XR) 500 MG 24 HR TABLET    Take 500 mg by mouth daily with breakfast.   METOPROLOL  SUCCINATE (TOPROL -XL) 25 MG 24 HR TABLET    Take 1 tablet (25 mg total) by mouth daily.   MONTELUKAST  (SINGULAIR ) 10 MG TABLET    TAKE 1 TABLET BY MOUTH EVERYDAY AT BEDTIME   MULTIPLE VITAMIN (MULTIVITAMIN) TABLET    Take 1 tablet by mouth daily with breakfast.   MYCOPHENOLATE  (CELLCEPT ) 500 MG TABLET    Take 2 tablets (1,000 mg total) by mouth 2 (two) times daily.   NITROGLYCERIN  (NITROGLYN) 2 % OINTMENT    Apply 0.25 inches topically 2 (two) times daily. Apply To anus and surrounding tissue. Use pea size amount   NITROGLYCERIN  (NITROSTAT ) 0.4 MG SL TABLET    Place 1 tablet (0.4 mg total) under the tongue every 5 (five) minutes as needed for chest pain.   NYSTATIN  CREAM (MYCOSTATIN )    Apply 1 Application topically 2 (two) times daily as needed (for irritation- affected sites).   OMEPRAZOLE  (PRILOSEC  OTC) 20 MG TABLET    Take 10 mg by mouth See admin instructions. Take 10 mg by mouth at 4:30 AM   OXYGEN     Inhale 4-5 L/min into the lungs See admin instructions. Inhale 4-5 L/min at bedtime and as needed for shortness of breath during the day   POTASSIUM CHLORIDE  (KLOR-CON  10) 10 MEQ TABLET    Take 1 tablet (10 mEq total) by mouth daily.   PRAVASTATIN  (PRAVACHOL ) 40 MG TABLET    Take 1 tablet (40 mg total) by mouth every evening.   PREDNISONE  (DELTASONE ) 10 MG  TABLET    50 mg daily  x 2 weeks and then 40 mg/day and stay there till further instructions   PREPARATION H 0.25-50 % GEL    Place 1 application  rectally 2 (two) times daily as needed (for hemorrhoids).   REVEFENACIN  (YUPELRI ) 175 MCG/3ML NEBULIZER SOLUTION    Inhale 1 vial by nebulization daily.   RITUXIMAB -PVVR (RUXIENCE  IV)    Inject into the vein. Infuse 1000mg  at Day 0 and Day 14. Repeat cycle every 6 months   SODIUM CHLORIDE  (OCEAN) 0.65 % SOLN NASAL SPRAY    Place 1 spray into both nostrils as needed for congestion.   SODIUM CHLORIDE , INHALANT, 7 % NEBU    Use per nebulizer once a day to help clear sputum   SULFAMETHOXAZOLE -TRIMETHOPRIM  (BACTRIM  DS) 800-160 MG TABLET    Take 1 tablet by mouth every Monday, Wednesday, and Friday.  Modified Medications   No medications on file  Discontinued Medications   No medications on file    Allergies Allergies  Allergen Reactions   Ozempic (0.25 Or 0.5 Mg-Dose) [Semaglutide(0.25 Or 0.5mg -Dos)] Other (See Comments)    Paralyzed the stomach   Tape Other (See Comments)    SKIN IS VERY FRAGILE!! Burns and pulls up the skin!! SKIN BRUISES and TEARS EASILY.SABRA   Betadine [Povidone Iodine] Other (See Comments)    Blisters  Codeine Nausea And Vomiting   Garlic Diarrhea   Hydrocodone  Nausea And Vomiting   Iodine Other (See Comments)    Blisters    Macrobid [Nitrofurantoin] Other (See Comments)    Instructed by dr not to take    Ofev  [Nintedanib] Other (See Comments)    Abdominal pain   Onion Diarrhea   Shellfish Allergy Nausea And Vomiting   Statins Other (See Comments)    Leg pain, but Pravachol  is tolerated   Sulfa  Antibiotics Other (See Comments)    Headaches     Past Medical History Past Medical History:  Diagnosis Date   Allergy    Arthritis    history spinal stenosis. osteoarthritis right hip   Asthma    Cataract    Coronary artery calcification seen on CAT scan 08/19/2017   >300 on CT scan 08/2017   DDD  (degenerative disc disease), thoracic    Depression    DOE (dyspnea on exertion)    a. 04/2010 Lexi MV EF 71%, no ischemia/infarct;     Fatty liver    GERD (gastroesophageal reflux disease)    H/O steroid therapy    Steroid use orally over 4 yrs- for Lung Fibrosis   Helicobacter pylori ab+    Hemorrhoids    Hiatal hernia    High cholesterol    History of chronic bronchitis    as child   History of migraine    History of MRSA infection    Hyperlipidemia, mixed 08/19/2017   Hyperplastic colon polyp 2007   IBS (irritable bowel syndrome)    Inguinal hernia    right   Insulin  resistance    past   Interstitial lung disease (HCC)    MVP (mitral valve prolapse)    Posterior mitral valve leaflet with mild MR   Pneumonia    Pneumonitis, hypersensitivity (HCC)    a. 09/2012 s/p Bx - ? 2/2 bird, mold, oil paint exposure ->on steroids, followed by pulm.   PONV (postoperative nausea and vomiting)    Pre-diabetes    takes Metformin    Pulmonary embolism (HCC)    Pulmonary fibrosis (HCC)    Dr. Geronimo follows- stable at present   PVC (premature ventricular contraction) 08/19/2017   Rapid heart rate    Dr. Shlomo follows- last visit Epic note 9'16   Squamous cell carcinoma of skin    Tinnitus, right ear    Vocal cord ulcer     Past Surgical History Past Surgical History:  Procedure Laterality Date   34 HOUR PH STUDY N/A 02/21/2018   Procedure: 24 HOUR PH STUDY;  Surgeon: Shila Gustav GAILS, MD;  Location: [facility] END[facility];  Service: Endoscopy;  Laterality: N/A;   BREAST BIOPSY Right 2009   BIOPSY, pt denies   BREAST BIOPSY Left 2003   Benign    CATARACT EXTRACTION Left    CESAREAN SECTION     COLONOSCOPY     COLONOSCOPY N/A 05/28/2024   Procedure: COLONOSCOPY;  Surgeon: Albertus Gordy HERO, MD;  Location: [facility];  Service: Gastroenterology;  Laterality: N/A;   ESOPHAGEAL MANOMETRY N/A 02/21/2018   Procedure: ESOPHAGEAL MANOMETRY (EM);  Surgeon: Shila Gustav GAILS, MD;  Location: [facility]  ENDOSCOPY;  Service: Endoscopy;  Laterality: N/A;   FOOT FRACTURE SURGERY  2006 or 2007   right   HYMENECTOMY     LUNG BIOPSY  09/28/2012   Procedure: LUNG BIOPSY;  Surgeon: Elspeth JAYSON Millers, MD;  [facility];  Service: Thoracic;  Laterality: N/A;  lung biopsies tims three  RIGHT HEART CATH N/A 04/04/2024   Procedure: RIGHT HEART CATH;  Surgeon: Rolan Ezra RAMAN, MD;  Location: [facility] INVASIVE CV LAB;  Service: Cardiovascular;  Laterality: N/A;   SQUAMOUS CELL CARCINOMA EXCISION Left    left arm   TOTAL HIP ARTHROPLASTY Right 09/10/2015   Procedure: RIGHT TOTAL HIP ARTHROPLASTY ANTERIOR APPROACH;  Surgeon: Donnice Car, MD;  Location: [facility] ORS;  Service: Orthopedics;  Laterality: Right;   TOTAL HIP ARTHROPLASTY Left 10/03/2019   Procedure: TOTAL HIP ARTHROPLASTY ANTERIOR APPROACH;  Surgeon: Car Donnice, MD;  Location: [facility] ORS;  Service: Orthopedics;  Laterality: Left;  70 mins   TUBAL LIGATION     UPPER GI ENDOSCOPY     VIDEO ASSISTED THORACOSCOPY  09/28/2012   Procedure: VIDEO ASSISTED THORACOSCOPY;  Surgeon: Elspeth JAYSON Millers, MD;  Location: [facility] OR;  Service: Thoracic;  Laterality: Right;   VIDEO BRONCHOSCOPY  11/19/2011   Procedure: VIDEO BRONCHOSCOPY WITH FLUORO;  Surgeon: Dorethia Cave, MD;  Location: [name] ENDOSCOPY;  Service: Endoscopy;;   VIDEO BRONCHOSCOPY Bilateral 05/23/2024   Procedure: VIDEO BRONCHOSCOPY WITHOUT FLUORO;  Surgeon: Mannam, Praveen, MD;  Location: [facility] ENDOSCOPY;  Service: Cardiopulmonary;  Laterality: Bilateral;    Family History family history includes Allergic rhinitis in her daughter; Asthma in her maternal grandmother and mother; Bone cancer in her paternal grandfather; Breast cancer in her cousin, cousin, cousin, paternal aunt, and paternal aunt; Dementia (age of onset: 64) in her mother; Diabetes in her father; Emphysema in her maternal grandmother; Heart disease in her sister; Hypertension in her sister; Kidney disease in her sister; Lung disease in her  maternal grandfather; Lymphoma in her father; Osteoarthritis in her mother; Ovarian cancer in her maternal aunt.  Social History Social History   Socioeconomic History   Marital status: Married    Spouse name: [name]   Number of children: 1   Years of education: Not on file   Highest education level: Master's degree (e.g., MA, MS, MEng, MEd, MSW, MBA)  Occupational History   Occupation: SELF EMPLOYED    Comment: Careers information officer, retired   Occupation: retired    Associate Professor: [employer]  Tobacco Use   Smoking status: Never   Smokeless tobacco: Never   Tobacco comments:    pt states she experimented in Designer, multimedia   Vaping status: Never Used  Substance and Sexual Activity   Alcohol use: Not Currently    Alcohol/week: 2.0 standard drinks of alcohol    Types: 1 Glasses of wine, 1 Standard drinks or equivalent per week   Drug use: No   Sexual activity: Never    Birth control/protection: None    Comment: sex partners in the last 12 months 0  Other Topics Concern   Not on file  Social History Narrative   Married; Doctor, general practice      Exercise - swimming daily for 20-30 minutes   Patient was started her periods at 73 year old (regular periods), and painful periods      Social Drivers of Corporate investment banker Strain: Not on file  Food Insecurity: No Food Insecurity (05/23/2024)   Hunger Vital Sign    Worried About Running Out of Food in the Last Year: Never true    Ran Out of Food in the Last Year: Never true  Transportation Needs: No Transportation Needs (05/23/2024)   PRAPARE - Administrator, Civil Service (Medical): No    Lack of Transportation (Non-Medical): No  Physical Activity: Not on  file  Stress: Not on file  Social Connections: Socially Integrated (05/23/2024)   Social Connection and Isolation Panel    Frequency of Communication with Friends and Family: More than three times a week    Frequency of Social Gatherings with  Friends and Family: More than three times a week    Attends Religious Services: 1 to 4 times per year    Active Member of Clubs or Organizations: No    Attends Banker Meetings: More than 4 times per year    Marital Status: Married  Catering manager Violence: Not At Risk (05/23/2024)   Humiliation, Afraid, Rape, and Kick questionnaire    Fear of Current or Ex-Partner: No    Emotionally Abused: No    Physically Abused: No    Sexually Abused: No    Lab Results  Component Value Date   HGBA1C 7.2 (H) 03/15/2024   HGBA1C 6.2 (H) 09/28/2019   HGBA1C 6.0 (H) 12/08/2018   Lab Results  Component Value Date   CHOL 220 (H) 07/19/2024   Lab Results  Component Value Date   HDL 94 07/19/2024   Lab Results  Component Value Date   LDLCALC 102 (H) 07/19/2024   Lab Results  Component Value Date   TRIG 147 07/19/2024   Lab Results  Component Value Date   CHOLHDL 2.3 07/19/2024   Lab Results  Component Value Date   CREATININE 0.84 07/19/2024   Lab Results  Component Value Date   GFR 72.14 06/14/2024   Lab Results  Component Value Date   MICROALBUR 1.9 07/19/2024      Component Value Date/Time   NA 142 07/19/2024 0839   NA 142 12/08/2018 0917   K 3.5 07/19/2024 0839   CL 105 07/19/2024 0839   CO2 30 07/19/2024 0839   GLUCOSE 109 (H) 07/19/2024 0839   BUN 29 (H) 07/19/2024 0839   BUN 18 12/08/2018 0917   CREATININE 0.84 07/19/2024 0839   CALCIUM  8.8 07/19/2024 0839   PROT 5.5 (L) 07/19/2024 0839   PROT 6.5 12/08/2018 0917   ALBUMIN 3.9 06/14/2024 1011   ALBUMIN 3.9 12/08/2018 0917   AST 18 07/19/2024 0839   ALT 27 07/19/2024 0839   ALKPHOS 38 (L) 06/14/2024 1011   BILITOT 0.4 07/19/2024 0839   BILITOT 0.4 12/08/2018 0917   GFRNONAA >60 05/30/2024 0823   GFRNONAA 66 02/26/2015 1540   GFRAA >60 10/04/2019 0216   GFRAA 77 02/26/2015 1540      Latest Ref Rng & Units 07/19/2024    8:39 AM 06/14/2024   10:11 AM 06/06/2024   12:13 PM  BMP  Glucose 65 - 99  mg/dL 890  55  810   BUN 7 - 25 mg/dL 29  35  37   Creatinine 0.60 - 1.00 mg/dL 9.15  9.19  9.02   BUN/Creat Ratio 6 - 22 (calc) 35     Sodium 135 - 146 mmol/L 142  140  134   Potassium 3.5 - 5.3 mmol/L 3.5  3.6  5.3 No hemolysis seen   Chloride 98 - 110 mmol/L 105  105  101   CO2 20 - 32 mmol/L 30  27  28    Calcium  8.6 - 10.4 mg/dL 8.8  9.1  9.6        Component Value Date/Time   WBC 12.9 (H) 07/10/2024 1418   RBC 4.43 07/10/2024 1418   HGB 13.0 07/10/2024 1418   HCT 40.6 07/10/2024 1418   PLT 215.0 07/10/2024 1418  MCV 91.7 07/10/2024 1418   MCH 28.4 05/30/2024 0823   MCHC 32.1 07/10/2024 1418   RDW 20.0 (H) 07/10/2024 1418   LYMPHSABS 0.7 07/10/2024 1418   MONOABS 0.3 07/10/2024 1418   EOSABS 0.0 07/10/2024 1418   BASOSABS 0.0 07/10/2024 1418     Parts of this note may have been dictated using voice recognition software. There may be variances in spelling and vocabulary which are unintentional. Not all errors are proofread. Please notify the dino if any discrepancies are noted or if the meaning of any statement is not clear.

## 2024-07-21 ENCOUNTER — Encounter: Payer: Self-pay | Admitting: Cardiology

## 2024-07-21 ENCOUNTER — Ambulatory Visit: Attending: Cardiology | Admitting: Cardiology

## 2024-07-21 ENCOUNTER — Telehealth: Payer: Self-pay | Admitting: Internal Medicine

## 2024-07-21 VITALS — BP 119/72 | HR 93 | Ht 59.0 in | Wt 145.0 lb

## 2024-07-21 DIAGNOSIS — E785 Hyperlipidemia, unspecified: Secondary | ICD-10-CM | POA: Diagnosis not present

## 2024-07-21 DIAGNOSIS — I2510 Atherosclerotic heart disease of native coronary artery without angina pectoris: Secondary | ICD-10-CM

## 2024-07-21 DIAGNOSIS — R002 Palpitations: Secondary | ICD-10-CM

## 2024-07-21 MED ORDER — METOPROLOL SUCCINATE ER 25 MG PO TB24: 25.0000 mg | ORAL_TABLET | Freq: Every day | ORAL | 3 refills | Status: AC

## 2024-07-21 MED ORDER — NITROGLYCERIN 0.4 MG SL SUBL: 0.4000 mg | SUBLINGUAL_TABLET | SUBLINGUAL | 10 refills | Status: AC | PRN

## 2024-07-21 MED ORDER — DILTIAZEM HCL ER COATED BEADS 120 MG PO CP24: 120.0000 mg | ORAL_CAPSULE | Freq: Every day | ORAL | 3 refills | Status: AC

## 2024-07-21 NOTE — Telephone Encounter (Signed)
 Inbound call from patient stating she had a hem banding procedure yesterday 8/28 and is now feeling gassy and experiencing diarrhea. Patient would like to know if she drinks more bentyl  or imodium.   Requesting a call back  Please advise Thank you

## 2024-07-21 NOTE — Progress Notes (Signed)
  Patient Consent for Virtual Visit         Rhonda [name] has provided verbal consent on 07/21/2024 for a virtual visit (video or telephone).   CONSENT FOR VIRTUAL VISIT FOR:  Rhonda [name]  By participating in this virtual visit I agree to the following:  I hereby voluntarily request, consent and authorize [facility] and its employed or contracted physicians, physician assistants, nurse practitioners or other licensed health care professionals (the Practitioner), to provide me with telemedicine health care services (the "Services) as deemed necessary by the treating Practitioner. I acknowledge and consent to receive the Services by the Practitioner via telemedicine. I understand that the telemedicine visit will involve communicating with the Practitioner through live audiovisual communication technology and the disclosure of certain medical information by electronic transmission. I acknowledge that I have been given the opportunity to request an in-person assessment or other available alternative prior to the telemedicine visit and am voluntarily participating in the telemedicine visit.  I understand that I have the right to withhold or withdraw my consent to the use of telemedicine in the course of my care at any time, without affecting my right to future care or treatment, and that the Practitioner or I may terminate the telemedicine visit at any time. I understand that I have the right to inspect all information obtained and/or recorded in the course of the telemedicine visit and may receive copies of available information for a reasonable fee.  I understand that some of the potential risks of receiving the Services via telemedicine include:  Delay or interruption in medical evaluation due to technological equipment failure or disruption; Information transmitted may not be sufficient (e.g. poor resolution of images) to allow for appropriate medical decision making by the  Practitioner; and/or  In rare instances, security protocols could fail, causing a breach of personal health information.  Furthermore, I acknowledge that it is my responsibility to provide information about my medical history, conditions and care that is complete and accurate to the best of my ability. I acknowledge that Practitioner's advice, recommendations, and/or decision may be based on factors not within their control, such as incomplete or inaccurate data provided by me or distortions of diagnostic images or specimens that may result from electronic transmissions. I understand that the practice of medicine is not an exact science and that Practitioner makes no warranties or guarantees regarding treatment outcomes. I acknowledge that a copy of this consent can be made available to me via my patient portal [facility] MyChart), or I can request a printed copy by calling the office of [facility].    I understand that my insurance will be billed for this visit.   I have read or had this consent read to me. I understand the contents of this consent, which adequately explains the benefits and risks of the Services being provided via telemedicine.  I have been provided ample opportunity to ask questions regarding this consent and the Services and have had my questions answered to my satisfaction. I give my informed consent for the services to be provided through the use of telemedicine in my medical care

## 2024-07-21 NOTE — Patient Instructions (Signed)
 Medication Instructions:  Nitroglycerin  Your physician recommends that you continue on your current medications as directed. Please refer to the Current Medication list given to you today.  *If you need a refill on your cardiac medications before your next appointment, please call your pharmacy*  Lab Work: NONE If you have labs (blood work) drawn today and your tests are completely normal, you will receive your results only by: MyChart Message (if you have MyChart) OR A paper copy in the mail If you have any lab test that is abnormal or we need to change your treatment, we will call you to review the results.  Testing/Procedures: NONE  Follow-Up: At [facility], you and your health needs are our priority.  As part of our continuing mission to provide you with exceptional heart care, our providers are all part of one team.  This team includes your primary Cardiologist (physician) and Advanced Practice Providers or APPs (Physician Assistants and Nurse Practitioners) who all work together to provide you with the care you need, when you need it.  Your next appointment:   6 month(s)  Provider:   [place], MD  We recommend signing up for the patient portal called MyChart.  Sign up information is provided on this After Visit Summary.  MyChart is used to connect with patients for Virtual Visits (Telemedicine).  Patients are able to view lab/test results, encounter notes, upcoming appointments, etc.  Non-urgent messages can be sent to your provider as well.   To learn more about what you can do with MyChart, go to [email or web address].

## 2024-07-21 NOTE — Progress Notes (Signed)
 Rhonda [name] was started on Dupixent  on 07/17/24, first dose in clinic. Prior to first use, patient was educated on appropriate use, expected response, side effects, storage and disposal. Please see 07/17/24 clinical support note for futher details.   Aleck Puls, PharmD, BCPS Clinical Pharmacist  [facility]

## 2024-07-21 NOTE — Telephone Encounter (Signed)
 Patient states that she has been having up to 10 BM daily chronically which she thought had improved with elimination of dairy, however, she has relapsed. She had hemorrhoidal banding yesterday and wanted to inquire as to whether she could take bentyl  or dicyclomine  to help slow her diarrhea as she doesn't want to slow things down too much after the banding. Patient is advised she may use dicyclomine  and can also use an imodium if needed. We are just trying to avoid her straining with bowel movements. Also advised against suppositories over the next couple of weeks while the hemorrhoidal banding site heals.

## 2024-07-25 ENCOUNTER — Encounter (HOSPITAL_COMMUNITY)

## 2024-07-25 ENCOUNTER — Encounter: Payer: Self-pay | Admitting: "Endocrinology

## 2024-07-26 ENCOUNTER — Telehealth: Payer: Self-pay

## 2024-07-26 ENCOUNTER — Telehealth: Payer: Self-pay | Admitting: Internal Medicine

## 2024-07-26 NOTE — Telephone Encounter (Signed)
 Pt called regarding elevated Blood Sugar due to taking high doses of prednisone . Pt blood sugar is currently 250. She has taken 22 units of lantus  and 11 units of humalog  at a.m and 12 units p.m.

## 2024-07-26 NOTE — Telephone Encounter (Signed)
 Copied from CRM [id number]. Topic: Appointments - Scheduling Inquiry for Clinic >> Jul 25, 2024  2:09 PM Abigail D wrote: Reason for CRM: Patient was told by Dr. Geronimo that she could get a high dose flu vaccine here, unable to schedule as scheduling was denied due to active referral request. Please call to schedule if possible.  Being seen tomorrow

## 2024-07-26 NOTE — Telephone Encounter (Signed)
 Called Dr Trixie on her Cell phone (Dr on call) regarding high priority message. Dr Trixie advised that due to pt BS being high 250 that she needs to take 4 extra units of humalog  and check sugar in 1 hr, if sugar is not coming down then take 2-3 more units of humalog  and drink a lot of water . Informed pt of instruction.

## 2024-07-26 NOTE — Telephone Encounter (Signed)
 Patient scheduled.

## 2024-07-26 NOTE — Telephone Encounter (Signed)
 TExt from Sharlet DELENA Mare That she came down to pred 30 and has green sputum and is easily desaturaing with miniaml exertion. Saw PCP ysterday and started doxy but getting worse  Plan  - told her to increase pred to 60  - give her appt to see me 07/27/24 at 11:30am    SIGNATURE    Dr. Dorethia Cave, M.D., F.C.C.P,  Pulmonary and Critical Care Medicine Staff Physician, [facility] Director - Interstitial Lung Disease  Program  Pulmonary Fibrosis [facility] at [facility], [place], [address]   Pager: [phone], If no answer  -> Check AMION or Try [phone] Telephone (clinical office): [phone] Telephone (research): [phone]  3:27 PM 07/26/2024

## 2024-07-27 ENCOUNTER — Encounter: Payer: Self-pay | Admitting: Internal Medicine

## 2024-07-27 ENCOUNTER — Encounter (HOSPITAL_COMMUNITY)

## 2024-07-27 ENCOUNTER — Ambulatory Visit: Admitting: "Endocrinology

## 2024-07-27 ENCOUNTER — Ambulatory Visit: Admitting: Internal Medicine

## 2024-07-27 VITALS — BP 108/54 | HR 81 | Ht 59.0 in | Wt 147.0 lb

## 2024-07-27 DIAGNOSIS — J209 Acute bronchitis, unspecified: Secondary | ICD-10-CM

## 2024-07-27 DIAGNOSIS — Z79899 Other long term (current) drug therapy: Secondary | ICD-10-CM

## 2024-07-27 DIAGNOSIS — J679 Hypersensitivity pneumonitis due to unspecified organic dust: Secondary | ICD-10-CM | POA: Diagnosis not present

## 2024-07-27 DIAGNOSIS — Z86711 Personal history of pulmonary embolism: Secondary | ICD-10-CM

## 2024-07-27 DIAGNOSIS — J45909 Unspecified asthma, uncomplicated: Secondary | ICD-10-CM

## 2024-07-27 DIAGNOSIS — Z7952 Long term (current) use of systemic steroids: Secondary | ICD-10-CM

## 2024-07-27 DIAGNOSIS — Z5181 Encounter for therapeutic drug level monitoring: Secondary | ICD-10-CM

## 2024-07-27 DIAGNOSIS — J9611 Chronic respiratory failure with hypoxia: Secondary | ICD-10-CM

## 2024-07-27 DIAGNOSIS — R609 Edema, unspecified: Secondary | ICD-10-CM

## 2024-07-27 NOTE — Progress Notes (Signed)
 06/14/2024 -   Chief Complaint  Patient presents with   Medical Management of Chronic Issues   Interstitial Lung Disease    Breathing has been slightly worse since last visit here. She is not coughing. She has not been using o2 much unless heavy exertion.      HPI Rhonda [name] 73 y.o. -presents for follow-up.  Presents with her husband Rhonda [name].  This is posthospital follow-up.  Number of issues.- Hospitalized again 05/22/2024-7 8/25 with acute worsening of respiratory failure associated with steroid taper.  Status post small bilateral pulmonary embolism diagnosis and started on anticoagulation.  Negative bronchoscopy for organisms.  Restarted on high-dose steroid pulse dose and commented to Rituxan  cycle 1 day 0 05/25/24 and day 15 (on 06/12/2024]  - Course complicated by rectal bleeding and showed polyps anal fissure versus hemorrhoid.  -Blurred vision with high-dose steroids  At this point in time in terms of  #Interstitial lung disease: Basically over the years she seems to be requiring more and more dose of prednisone  to prevent her from having hypoxemic exacerbations.  It used to be 5 mg and then it was 10 mg and then 20 mg and then now anywhere from 40 to 60 mg.  With each taper she gets exacerbations.  With started on CellCept  in the spring 2025 but this got interrupted because of viral exacerbation.  Then we treated empirically with Bactrim  for PJP.  Then in early July 2025 she had respiratory worsening again diagnosed with a small pulmonary embolism but hypoxemia is felt to be out of proportion.  She was seen by Dr. Theophilus in the hospital.  Treated with pulsed dose steroid and also is committed to IV Rituxan .  This is since helped her hypoxemia.  Today she was able to do a sit/stand test without desaturation.  But she only did 5 times.  At home she is no longer hypoxemic room air at rest.  In the hospital she was on significant amount of oxygen .  At this point in time she is triple  immunosuppressed with prednisone  60 mg, status post Rituxan  cycle 1 and also CellCept .  She is tolerating these medicines okay except for various other symptoms which might be medication side effect [see below].  #Small PE continues on Eliquis   #Rectal bleeding in the hospital she had rectal bleeding.  She is on Eliquis  with the thought process was this was related to hemorrhoids she had colonoscopies for polyps.  She is going to have anal banding  #Dizziness: This was supposed to even before the admission.  Post discharge she fell and went to the ER head bleed was ruled out.  She states that the steroid has been lowered this is slightly better.  #Blurring of the vision this is significantly present with high-dose of steroids which is required for the lungs.  She wants to see an endocrinologist have referred her to Dr. Tawni Marking.  Dr. Darice Henle her primary care doctor is also recommending this.  #Pedal edema: She developed new onset pedal edema after discharge when I gave her Lasix  for a week it resolved but it is coming back again she is been off Lasix  for 4 days.  Will check a BNP.  Will put her on permanent Lasix .       OV 07/27/2024  Subjective:  Patient ID: Rhonda [name], female , DOB: Dec 07, 1950 , age 69 y.o. , MRN: [id number] , ADDRESS: [address] [place]  [id number] PCP Henle Darice CROME,  MD Patient Care Team: Burney Darice CROME, MD as PCP - General (Family Medicine) Geronimo Amel, MD as Consulting Physician (Pulmonary Disease) Edith, Debby BROCKS, MD (Cardiology) Wonda Cy BROCKS, RD as Dietitian (Family Medicine) Lavona Agent, MD as Consulting Physician (Cardiology)  This Provider for this visit: Treatment Team:  Attending Provider: Geronimo Amel, MD    07/27/2024 -   Chief Complaint  Patient presents with   Acute Visit    She has had some swelling in her feet over the past few days. She has had some increased SOB and increased need for o2. She has  had some prod cough with green sputum and some bloody nasal d/c.       2012 and 2013  #GE reflux with small hiatal hernia  - on ppi   #History of rapid heart rate not otherwise specified  - Start her on Lopressor   2008/2009 by primary care physician. History appears to correlate with onset of respiratory issues  - refuses to dc this drug as of 2014 discussion   # Hypersensitivity Pneumonitis and ILD  - Potential etiologies - cockateil x 2 for 18 years through end 2012. 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 Denies mold but lives in house built in [place] with a weird baselment: and has humidifier  - first noted on CT chest 10/15/09 following trip to [place] (PE negative) but not described in 2003 CT chest report  - autoimmune panel: 10/23/11: Negative  -  Uderwennt bronch 11/19/11  - non diagnostic  - VATS Nov 2013 (done after initially refusing)- CRONIC HYPERSENSITIVITY PNEUMONITIS WITH GRANULOMA. However, there is worrying trend of UIP pattern in the Upper lobes.     Follow-up chronic hypersensitive pneumonitis ; slow progressive phenotype -  On ILD-pro registry protocol - On chronic prednisone   - 5mg  per day but stince spring 2024 needing more brsts and settled on 20mg  per day as of Nov 2024, high-dose prednisone  in spring 2025 and tapered and flared up and then back on high-dose prednisone  in July 2025 after pulsed dose steroid in the hospital     - end July 2025 -> 50mg  x 2 weeks -> pred 40mg  daily -> 30mg  on 07/23/24 - failed ofev  due to side efect  - failed esbriet  due to sde effects  March 2022   - declind lung tx referral 2023 , reconsider this in the spring 2025  - declined cellcept  May 2024;  Started CellCept  March 2025 and then there was a pause and restarted  - Started Rituxan  July 2025   - Last high-res CT Nov 222 -> mrach 2024: no change    -Right heart cath -March 2025 with normal pulmonary  pressure mean PA 19 but concern for right-to-left shunt    - Hospitalized 03/14/2024 -03/17/2024 because of acute on chronic hypoxemic respiratory flare requiring steroids.  Respiratory virus panel positive for rhinovirus  - - Follow-up in clinic 4/ 28/25 with persistent respiratory failure and decompensation clinically: Empirically treated with 3 weeks Bactrim  for empiric PJP [LDH high]  - During this time CellCept  on hold and then restarted  - Restarted Bactrim  for PJP prophylaxis after this  - Hospitalized again 05/22/2024-7 8/25 with acute worsening of respiratory failure associated with steroid taper.  Status post small bilateral pulmonary embolism diagnosis and started on anticoagulation.  Negative bronchoscopy for organisms.  Restarted on high-dose steroid pulse dose and commented to Rituxan  cycle 1 day 0 05/25/24  and day 15 (on 06/12/2024]  - Course complicated by rectal bleeding and showed polyps anal fissure versus hemorrhoid.  -Blurred vision with high-dose steroids  - Allergic asthma   - started dupixent  Jul 17, 2024 Monday   HPI NILE DORNING 73 y.o. -acute visit.  On Monday, July 17, 2024 she got her first dose of Dupixent .  This strategy has because she has allergic asthma as well and she is being very steroid-dependent and super immunosuppressed was to introduce biologic agent against eosinophils to reduce airway inflammation and hopefully wean her off steroids.  However after she took the first dose [2 injections] she had extreme fatigue for 4 days.  She is somewhat reluctant about doing this but then she agreed to do it.  Then on on Friday, July 21, 2024 she tapered herself down to prednisone  30 mg/day.  [Her lungs are very prednisone  responsive but she is now on triple immunosuppression and the goal is to slowly get it down to a lower dose of prednisone  in order to avoid side effects of diabetes and other prednisone  side effects] then on Sunday, 07/23/2024 she started noticing  green sputum and easy desaturations.  Then on Monday, 07/24/2024 she saw Darice Henle her primary care physician who put her on doxycycline  which she is taking.  But she still had increased desaturations or easy desaturation so she called me she says she is needing 3 L oxygen  with exertion.  She is worried about this.  Advised her to increase her prednisone  to 60 mg but she did not do it and still she opted to do acute visit today.  Coincidently she also stopped Lasix  a few days ago because her edema had resolved but the edema came back and now she is starting Lasix .  She stopped her Lasix  on Friday, 07/21/2024 the same day she dropped her prednisone  down to 30 mg/day.  She is also concerned about significant hyperglycemia.  She is changing her endocrinologist to [place]re[place] due to personal preference.  She is most concerned about her green sputum and easy desaturations.  Along with the fact she has hyperglycemia.  Her transplant evaluation is currently on hold but I encouraged her to revisit this.      SYMPTOM SCALE - ILD Sept 2020 01/30/2020  08/29/2020  01/14/2021 146# 07/08/2021 147# - off esbriet . Sic past week 08/14/2021 149# - pred only. No antifibrotic 10/03/2021 148# - pred 5mg  07/21/2022 137# 10/27/2022  04/13/2023 20mg  pered - 134# 06/08/2023 135# , pred 20mg  10/06/2023 138# 04/13/2024  06/14/2024 Pred 60  Rituxan  July 2025  Cellcept   bactrim   O2 use  o2 with ex o2 withe ex 2L     2LL o2 with ex 2L with ex 2 laps on RA       Shortness of Breath  0 -> 5 scale with 5 being worst (score 6 If unable to do)              At rest 0 0 1 0 1 0 1 0 1 1  0 0 1 1  Simple tasks - showers, clothes change, eating, shaving 01 1 1 1.5 2 1 2 1 2 1 1 1 1 2   Household (dishes, doing bed, laundry) 0 3 3 3 4 2  3.5 2 3 2 2 2 3 2   Shopping 1 1 1 2 2 1 1 1 2 1 2 1 1 3   Walking level at own pace 1 2 2 1 2 2 3 1 2 2  1  3 1 2   Walking up Stairs 2 3 3 3 4 3 4 3 4 3 4  2.5 3 5   Total (30-36) Dyspnea Score 5 10  11  10.5 15 9  14.5 8 14 10 10 9 10 15   How bad is your cough? 1 3 2 2 2 2 2 2 3  0 2 1 1  0  How bad is your fatigue 1 2 2 3 2  0 3 1 2 2 4 2 5 3   How bad is nausea  0 0 3 0 0 1 0 0 0 0  0 0 0  How bad is vomiting?   0 0 0 0 0 1 0 0 0 0  0 0 0  How bad is diarrhea?  00 1 0 0 0 1 0 0 0 1  0 0 0  How bad is anxiety?  0 0 0 00 0 2 0 00 0 0  0 0 1  How bad is depression  0 2 1 1 0 1 0 0 0 0  0 0 2  00 0 0 Simple office walk 185 feet x  3 laps goal with forehead probe 04/13/2018  08/16/2018  12/15/2018  05/09/2019  08/22/2019  01/30/2020  08/29/2020  01/14/2021  07/08/2021  08/14/2021  10/03/2021  07/21/2022  10/27/2022  04/13/2023  06/08/2023  10/06/2023  04/13/2024  06/14/2024  07/27/2024   O2 used Room air Room air Room air Room air Room air Room air ra ra ra ra ra ra ra ra Ra sit stand ra   ra  Number laps completed 3 3 3 3 2  stopped at 2 die to hip pain 3 laps - no hip pain following hip surgery 3 3 3  attempted byt did  only 2 3 bu stopped at 2 3 and did all 3 3 Ddi onl 2 las Sit/stand x 10 times X 10 Sit stand x 15   Walked 1 of 3 laps  Comments about pace good Moderate pace Normal, hip bothering     avg pace avg  avg avg avg Good pace       Resting Pulse Ox/HR 98% and 73/min 98% and HR 77/min 99% and HR 61/min 98% and HR 70/min 98% 98% and 75/min 97% and 67/mi 94% and 80/min 96% and HR 72 98% and 71 100% and HR 82 98% ad HR 72 100% and HR 79 97% and heart rate 74 93% and HR 83 98% and HR 91   96% abd HR 76  Final Pulse Ox/HR 91% and 91/min 93% and 92.min 94% and 92/min 93% and 98/min 91%  89% and 93/,imn 88% and 94 89% ad 88.nin 88% at 2nd  lap end HR 92 86% and 96 89% and HR 91 93% and HR 87 84% and HR 104 94% and heart rate 92 87% and HR 90 92% and HR 105 Walked 1 lap on room air went down to 90%. Did sit stand x 5 and did well statyed 97% 87% and HR 98 at 1 laps  Desaturated </= 88% no no no no   yes almost yes yes almst no yes no yes      Desaturated <= 3% points yes yes Yes, 5 points Yes, 5 points     Yes, 5 points Yes, 8 points Yes, 12 pints Yes 11 pot 5 pots 16 pont Yes, 3 poi yes      Got Tachycardic >/= 90/min yes yes yes yes     yes yes yes no  Symptoms at end of test none none Hip pain and very mild dyspnea  Stopped due to hip pain Moderate duyspnea with mask Mild to moderate dyspnea  Severe dyspnea Mild dyspnea Severe dyspnea Very mild dyspneax moderate No dyspnea Mild dyspnea      Miscellaneous comments x x    No hip pain    Similar to lastime Symptoms out of proportion ? better worse           PFT     Latest Ref Rng & Units 06/14/2024    8:19 AM 04/05/2024    8:21 AM 10/01/2023    9:49 AM 06/04/2023   10:12 AM 03/08/2023   10:49 AM 12/09/2022   11:48 AM 07/21/2022    9:50 AM  PFT Results  FVC-Pre L 1.48  1.52  1.43  1.44  1.45  1.53  1.54   FVC-Predicted Pre % 63  64  60  60  64  66  67   Pre FEV1/FVC % % 89  87  92  91  93  87  91   FEV1-Pre L 1.32  1.33  1.31  1.30  1.35  1.32  1.40   FEV1-Predicted Pre % 75  76  73  73  80  77  81   DLCO uncorrected ml/min/mmHg 7.71  8.29  9.73  10.82  8.11  9.64  8.16   DLCO UNC% % 46  49  58  64  50  59  50   DLCO corrected ml/min/mmHg 7.89  8.24  9.73  10.82  7.81  9.41  7.86   DLCO COR %Predicted % 47  49  58  64  48  58  48   DLVA Predicted % 76  78  81  85  65  81  64   TLC L     3.02     TLC % Predicted %     70     RV % Predicted %     70          LAB RESULTS last 96 hours No results found.       has a past medical history of Allergy, Arthritis, Asthma, Cataract, Coronary artery calcification seen on CAT scan (08/19/2017), DDD (degenerative disc disease), thoracic, Depression, DOE (dyspnea on exertion), Fatty liver, GERD (gastroesophageal reflux disease), H/O steroid therapy, Helicobacter pylori ab+, Hemorrhoids, Hiatal hernia, High cholesterol, History of chronic bronchitis, History of migraine, History of MRSA infection, Hyperlipidemia, mixed (08/19/2017), Hyperplastic colon polyp (2007), IBS (irritable bowel  syndrome), Inguinal hernia, Insulin  resistance, Interstitial lung disease (HCC), MVP (mitral valve prolapse), Pneumonia, Pneumonitis, hypersensitivity (HCC), PONV (postoperative nausea and vomiting), Pre-diabetes, Pulmonary embolism (HCC), Pulmonary fibrosis (HCC), PVC (premature ventricular contraction) (08/19/2017), Rapid heart rate, Squamous cell carcinoma of skin, Tinnitus, right ear, and Vocal cord ulcer.   reports that she has never smoked. She has never used smokeless tobacco.  Past Surgical History:  Procedure Laterality Date   60 HOUR PH STUDY N/A 02/21/2018   Procedure: 24 HOUR PH STUDY;  Surgeon: Shila Gustav GAILS, MD;  Location: [facility];  Service: Endoscopy;  Laterality: N/A;   BREAST BIOPSY Right 2009   BIOPSY, pt denies   BREAST BIOPSY Left 2003   Benign    CATARACT EXTRACTION Left    CESAREAN SECTION     COLONOSCOPY     COLONOSCOPY N/A 05/28/2024   Procedure: COLONOSCOPY;  Surgeon: Albertus Gordy HERO, MD;  Location: [facility];  Service: Gastroenterology;  Laterality:  N/A;   ESOPHAGEAL MANOMETRY N/A 02/21/2018   Procedure: ESOPHAGEAL MANOMETRY (EM);  Surgeon: Shila Gustav GAILS, MD;  Location: [facility];  Service: Endoscopy;  Laterality: N/A;   FOOT FRACTURE SURGERY  2006 or 2007   right   HYMENECTOMY     LUNG BIOPSY  09/28/2012   Procedure: LUNG BIOPSY;  Surgeon: Elspeth JAYSON Millers, MD;  Location: [facility] OR;  Service: Thoracic;  Laterality: N/A;  lung biopsies tims three   RIGHT HEART CATH N/A 04/04/2024   Procedure: RIGHT HEART CATH;  Surgeon: Rolan Ezra RAMAN, MD;  Location: [facility] INVASIVE CV LAB;  Service: Cardiovascular;  Laterality: N/A;   SQUAMOUS CELL CARCINOMA EXCISION Left    left arm   TOTAL HIP ARTHROPLASTY Right 09/10/2015   Procedure: RIGHT TOTAL HIP ARTHROPLASTY ANTERIOR APPROACH;  Surgeon: Donnice Car, MD;  Location: [facility];  Service: Orthopedics;  Laterality: Right;   TOTAL HIP ARTHROPLASTY Left 10/03/2019   Procedure: TOTAL HIP ARTHROPLASTY ANTERIOR  APPROACH;  Surgeon: Car Donnice, MD;  Location: [facility];  Service: Orthopedics;  Laterality: Left;  70 mins   TUBAL LIGATION     UPPER GI ENDOSCOPY     VIDEO ASSISTED THORACOSCOPY  09/28/2012   Procedure: VIDEO ASSISTED THORACOSCOPY;  Surgeon: Elspeth JAYSON Millers, MD;  Location: [facility] OR;  Service: Thoracic;  Laterality: Right;   VIDEO BRONCHOSCOPY  11/19/2011   Procedure: VIDEO BRONCHOSCOPY WITH FLUORO;  Surgeon: Dorethia Cave, MD;  Location[facility];  Service: Endoscopy;;   VIDEO BRONCHOSCOPY Bilateral 05/23/2024   Procedure: VIDEO BRONCHOSCOPY WITHOUT FLUORO;  Surgeon: Mannam, Praveen, MD;  Location: [facility];  Service: Cardiopulmonary;  Laterality: Bilateral;    Allergies  Allergen Reactions   Ozempic (0.25 Or 0.5 Mg-Dose) [Semaglutide(0.25 Or 0.5mg -Dos)] Other (See Comments)    Paralyzed the stomach   Tape Other (See Comments)    SKIN IS VERY FRAGILE!! Burns and pulls up the skin!! SKIN BRUISES and TEARS EASILY.SABRA   Betadine [Povidone Iodine] Other (See Comments)    Blisters    Codeine Nausea And Vomiting   Garlic Diarrhea   Hydrocodone  Nausea And Vomiting   Iodine Other (See Comments)    Blisters    Macrobid [Nitrofurantoin] Other (See Comments)    Instructed by dr not to take    Ofev  [Nintedanib] Other (See Comments)    Abdominal pain   Onion Diarrhea   Shellfish Allergy Nausea And Vomiting   Statins Other (See Comments)    Leg pain, but Pravachol  is tolerated   Sulfa  Antibiotics Other (See Comments)    Headaches     Immunization History  Administered Date(s) Administered   Fluad Quad(high Dose 65+) 07/17/2019   Hepatitis A 05/23/2010   Hepatitis B 06/23/2006   INFLUENZA, HIGH DOSE SEASONAL PF 08/24/2016, 07/24/2017, 08/31/2018, 08/15/2020, 09/26/2020, 09/12/2021, 08/24/2022   Influenza Split 08/11/2012, 08/14/2017   Influenza Whole 09/08/2011   Influenza,inj,Quad PF,6+ Mos 09/07/2013   Influenza-Unspecified 09/26/2014, 07/27/2015, 08/24/2023   MMR  05/23/2010   Moderna Sars-Covid-2 Vaccination 05/07/2021, 07/30/2021, 01/01/2022   PFIZER(Purple Top)SARS-COV-2 Vaccination 12/16/2019, 01/08/2020, 07/18/2020, 08/11/2022   Pfizer(Comirnaty)Fall Seasonal Vaccine 12 years and older 07/24/2023   Pneumococcal Conjugate-13 03/15/2014   Pneumococcal Polysaccharide-23 01/11/2018, 09/26/2020, 08/26/2021   Respiratory Syncytial Virus Vaccine,Recomb Aduvanted(Arexvy) 07/28/2022   Td 02/22/2003   Tdap 10/28/2012   Zoster Recombinant(Shingrix) 02/15/2017, 05/17/2017   Zoster, Live 12/04/2010    Family History  Problem Relation Age of Onset   Asthma Mother    Osteoarthritis Mother    Dementia Mother 31  Lymphoma Father    Diabetes Father    Hypertension Sister    Heart disease Sister    Kidney disease Sister    Allergic rhinitis Daughter    Ovarian cancer Maternal Aunt    Breast cancer Paternal Aunt    Breast cancer Paternal Aunt    Emphysema Maternal Grandmother    Asthma Maternal Grandmother    Lung disease Maternal Grandfather    Bone cancer Paternal Grandfather    Breast cancer Cousin    Breast cancer Cousin    Breast cancer Cousin    Colon cancer Neg Hx    Angioedema Neg Hx    Eczema Neg Hx    Immunodeficiency Neg Hx    Urticaria Neg Hx      Current Outpatient Medications:    albuterol  (PROAIR  HFA) 108 (90 Base) MCG/ACT inhaler, Inhale 2 puffs into the lungs every 4 (four) hours as needed for wheezing. Or coughing spells.  You may use 2 Puffs 5-10 minutes before exercise., Disp: 1 each, Rfl: 3   albuterol  (PROVENTIL ) (2.5 MG/3ML) 0.083% nebulizer solution, Take 3 mLs (2.5 mg total) by nebulization every 6 (six) hours as needed for wheezing or shortness of breath., Disp: 360 mL, Rfl: 0   Alpha-D-Galactosidase (BEANO PO), Take 1 tablet by mouth as needed (as directed, if eating onion or garlic)., Disp: , Rfl:    ALPRAZolam  (XANAX ) 0.25 MG tablet, Take 0.25 mg by mouth at bedtime as needed for anxiety or sleep., Disp: , Rfl:     AMBULATORY NON FORMULARY MEDICATION, See admin instructions. Allergy injections on Mondays and hold when sick, Disp: , Rfl:    apixaban  (ELIQUIS ) 5 MG TABS tablet, Take 1 tablet (5 mg total) by mouth 2 (two) times daily., Disp: 60 tablet, Rfl: 5   ascorbic acid (VITAMIN C) 500 MG tablet, Take 500 mg by mouth daily., Disp: , Rfl:    ASHWAGANDHA PO, Take 1 capsule by mouth every other day., Disp: , Rfl:    azelastine (ASTELIN) 0.1 % nasal spray, Place 1 spray into both nostrils in the morning. Use in each nostril as directed, Disp: , Rfl:    Calcium  Carb-Cholecalciferol (CALCIUM  500 + D PO), Take 1 tablet by mouth daily., Disp: , Rfl:    cholecalciferol (VITAMIN D3) 25 MCG (1000 UNIT) tablet, Take 1,000 Units by mouth every other day., Disp: , Rfl:    Continuous Glucose Sensor (DEXCOM G7 SENSOR) MISC, Check blood sugar fasting and before meals and at bedtime., Disp: 4 each, Rfl: 0   cycloSPORINE (RESTASIS) 0.05 % ophthalmic emulsion, Place 1 drop into both eyes 2 (two) times daily., Disp: , Rfl:    diltiazem  (CARDIZEM  CD) 120 MG 24 hr capsule, Take 1 capsule (120 mg total) by mouth daily., Disp: 90 capsule, Rfl: 3   doxycycline  (VIBRAMYCIN ) 100 MG capsule, Take 100 mg by mouth every 12 (twelve) hours., Disp: , Rfl:    Dupilumab  (DUPIXENT ) 300 MG/2ML SOAJ, Inject 300 mg into the skin every 14 (fourteen) days., Disp: 4 mL, Rfl: 2   EPINEPHrine  0.3 mg/0.3 mL IJ SOAJ injection, Inject 0.3 mg into the muscle as needed for anaphylaxis., Disp: , Rfl:    ezetimibe  (ZETIA ) 10 MG tablet, Take 1 tablet (10 mg total) by mouth daily., Disp: 90 tablet, Rfl: 3   fluticasone  (FLONASE ) 50 MCG/ACT nasal spray, Spray 2 sprays into each nostril every day., Disp: 48 g, Rfl: 12   Furosemide  (LASIX  PO), Take 1 tablet by mouth daily., Disp: , Rfl:  Glucagon  (BAQSIMI  ONE PACK) 3 MG/DOSE POWD, Place 1 Device into the nose as needed (Low blood sugar with impaired consciousness)., Disp: 2 each, Rfl: 3   HUMALOG  KWIKPEN 100  UNIT/ML KwikPen, Inject 8-10 Units into the skin See admin instructions. Inject 10 units into the skin before breakfast & lunch and 8 before supper, Disp: , Rfl:    Hypromellose (NATURES TEARS OP), Place 1 drop into both eyes in the morning and at bedtime., Disp: , Rfl:    LANTUS  SOLOSTAR 100 UNIT/ML Solostar Pen, Inject 100 Units into the skin daily., Disp: , Rfl:    levocetirizine (XYZAL ) 5 MG tablet, Take 5 mg by mouth at bedtime., Disp: , Rfl:    metFORMIN  (GLUCOPHAGE -XR) 500 MG 24 hr tablet, Take 500 mg by mouth daily with breakfast., Disp: , Rfl:    metoprolol  succinate (TOPROL -XL) 25 MG 24 hr tablet, Take 1 tablet (25 mg total) by mouth daily., Disp: 90 tablet, Rfl: 3   montelukast  (SINGULAIR ) 10 MG tablet, TAKE 1 TABLET BY MOUTH EVERYDAY AT BEDTIME, Disp: 90 tablet, Rfl: 2   Multiple Vitamin (MULTIVITAMIN) tablet, Take 1 tablet by mouth daily with breakfast., Disp: , Rfl:    mycophenolate  (CELLCEPT ) 500 MG tablet, Take 2 tablets (1,000 mg total) by mouth 2 (two) times daily., Disp: 120 tablet, Rfl: 0   nitroGLYCERIN  (NITROGLYN) 2 % ointment, Apply 0.25 inches topically 2 (two) times daily. Apply To anus and surrounding tissue. Use pea size amount, Disp: 30 g, Rfl: 0   nitroGLYCERIN  (NITROSTAT ) 0.4 MG SL tablet, Place 1 tablet (0.4 mg total) under the tongue every 5 (five) minutes as needed for chest pain., Disp: 25 tablet, Rfl: 10   nystatin  cream (MYCOSTATIN ), Apply 1 Application topically 2 (two) times daily as needed (for irritation- affected sites)., Disp: , Rfl:    omeprazole  (PRILOSEC  OTC) 20 MG tablet, Take 10 mg by mouth See admin instructions. Take 10 mg by mouth at 4:30 AM, Disp: , Rfl:    OXYGEN , Inhale 4-5 L/min into the lungs See admin instructions. Inhale 4-5 L/min at bedtime and as needed for shortness of breath during the day, Disp: , Rfl:    potassium chloride  (KLOR-CON  10) 10 MEQ tablet, Take 1 tablet (10 mEq total) by mouth daily., Disp: 30 tablet, Rfl: 2   pravastatin   (PRAVACHOL ) 40 MG tablet, Take 1 tablet (40 mg total) by mouth every evening., Disp: 90 tablet, Rfl: 1   predniSONE  (DELTASONE ) 10 MG tablet, 50 mg daily  x 2 weeks and then 40 mg/day and stay there till further instructions, Disp: 100 tablet, Rfl: 1   PREPARATION H 0.25-50 % GEL, Place 1 application  rectally 2 (two) times daily as needed (for hemorrhoids)., Disp: , Rfl:    revefenacin  (YUPELRI ) 175 MCG/3ML nebulizer solution, Inhale 1 vial by nebulization daily., Disp: 90 mL, Rfl: 5   riTUXimab -pvvr (RUXIENCE  IV), Inject into the vein. Infuse 1000mg  at Day 0 and Day 14. Repeat cycle every 6 months, Disp: , Rfl:    sodium chloride  (OCEAN) 0.65 % SOLN nasal spray, Place 1 spray into both nostrils as needed for congestion., Disp: , Rfl:    Sodium Chloride , Inhalant, 7 % NEBU, Use per nebulizer once a day to help clear sputum, Disp: 120 mL, Rfl: 3   sulfamethoxazole -trimethoprim  (BACTRIM  DS) 800-160 MG tablet, Take 1 tablet by mouth every Monday, Wednesday, and Friday., Disp: , Rfl:    chlorpheniramine (CHLOR-TRIMETON) 4 MG tablet, Take 2 mg by mouth 2 (two) times daily as  needed for allergies or rhinitis. (Patient not taking: Reported on 07/21/2024), Disp: , Rfl:       Objective:   Vitals:   07/27/24 1139  BP: (!) 108/54  Pulse: 81  SpO2: 95%  Weight: 147 lb (66.7 kg)  Height: 4' 11 (1.499 m)    Estimated body mass index is 29.69 kg/m as calculated from the following:   Height as of this encounter: 4' 11 (1.499 m).   Weight as of this encounter: 147 lb (66.7 kg).  @WEIGHTCHANGE @  [employer]   07/27/24 1139  Weight: 147 lb (66.7 kg)     Physical Exam   General: No distress. Looks more cushingid O2 at rest: yes but not using right now Spencer present: yes Sitting in wheel chair: no Frail: yes Obese: no Neuro: Alert and Oriented x 3. GCS 15. Speech normal Psych: Pleasant Resp:  Barrel Chest - no.  Wheeze - no, Crackles - yes UL, No overt respiratory distress CVS: Normal  heart sounds. Murmurs - no Ext: Stigmata of Connective Tissue Disease - no HEENT: Normal upper airway. PEERL +. 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 oil pain and ? mold in house  High risk medication use  Encounter for therapeutic drug monitoring  Current chronic use of systemic steroids  Chronic respiratory failure with hypoxia (HCC)  History of pulmonary embolism  Extrinsic asthma without complication, unspecified asthma severity, unspecified whether persistent  Edema, unspecified type    PLAN Patient Instructions    Acute bronchitis   - currently on doxycycline  per PCP and holding - unclear if worsneing is a bacteria/virus versus due to reducing prednisone   Plan  - give sputum gram stain and culture if it does not improve  - complete doxycycline  course  -stay In touch   Allergic asthma   - started dupixent  10 days ago and had significant fatigue after that  Plan  - continue dupixent  - next dose is 1 shot (instead of loading dose 2) - goal here is to reduce airway inflammation  - our pharmacist might be willing to monitor you  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 oil pain and ? mold in house ILD (interstitial lung disease) (HCC) Chronic respiratory failure with hypoxia (HCC) High risk medication use Visit for monitoring Rituxan  therapy Encounter for long term use of mycophenolate  mofetil Current chronic use of systemic steroids   Goal STILL is to at least hold your lung disease at the current level with much less prednisone  Prednisone  while helping is causing significant side effects  - noted you reduced prednisone  to 30mg  per day on Friday July 23, 2024 - We will have to accept triple immunosuppression at this point to help protect your lungs  Walking desaturation test suggests ILD is similar to May 2025  Plan    - Stay at prednisone  30mg  per day for next 3 weeks -> Call Dr Geronimo before reducing to 20mg  per day - Continue Rituxan  every 6 months [last dose May 25, 2024 and next dose would be in January 2026] - Continue CellCept  1000 mg twice daily - Continue Bactrim  for PJP prophylaxis - Check blood work CBC chemistry and  liver function in 1-2 weeks - reconnect with [facility]  Edema, unspecified type -could be related to prednisone  or CellCept  or diastolic dysfunction Onset was posthospitalization  - seems varicoise and  prednisone  related  Plan  - Lasix  as needed (I think we are resigned to this)   Physical deconditioning  -Improved/holding  Plan  - Continue with physical therapy and other exercise programs     Rectal bleeding -onset early July 2025 in the hospital  Plan  - per GI; support procedure  History of pulmonary embolism- small July 2025  Plan  - continue eliquis   -  if bleediong severe have to stop or lower dose  Steroid INduced High sugar  Plan  - per Dr Tawni Georganna Kawasaki     FOLLOWUP    Return for 10/6 with Candis Dandy and later in October with Graysyn Bache (as already shceduled).    SIGNATURE    Dr. Dorethia Cave, M.D., F.C.C.P,  Pulmonary and Critical Care Medicine Staff Physician, [facility] Director - Interstitial Lung Disease  Program  Pulmonary Fibrosis [facility] [place], [place], [address]  Pager: [phone], If no answer or between  15:00h - 7:00h: call [phone] Telephone: [phone]  5:21 PM 07/27/2024

## 2024-07-27 NOTE — Telephone Encounter (Signed)
 See other encounter regarding elevated blood sugar.

## 2024-07-27 NOTE — Patient Instructions (Addendum)
   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 oil pain and ? mold in house ILD (interstitial lung disease) (HCC) Chronic respiratory failure with hypoxia (HCC) High risk medication use Visit for monitoring Rituxan  therapy Encounter for long term use of mycophenolate  mofetil Current chronic use of systemic steroids   Goal STILL is to at least hold your lung disease at the current level with much less prednisone  Prednisone  while helping is causing significant side effects  - noted you reduced prednisone  to 30mg  per day on Friday July 23, 2024 - We will have to accept triple immunosuppression at this point to help protect your lungs  Walking desaturation test suggests ILD is similar to May 2025  Plan   - Stay at prednisone  30mg  per day for next 3 weeks -> Call Dr Geronimo before reducing to 20mg  per day - Continue Rituxan  every 6 months [last dose May 25, 2024 and next dose would be in January 2026] - Continue CellCept  1000 mg twice daily - Continue Bactrim  for PJP prophylaxis - Check blood work CBC chemistry and  liver function in 1-2 weeks - reconnect with [facility]  Edema, unspecified type -could be related to prednisone  or CellCept  or diastolic dysfunction Onset was posthospitalization  - seems varicoise and prednisone  related  Plan  - Lasix  as needed (I think we are resigned to this)   Physical  deconditioning  -Improved/holding  Plan  - Continue with physical therapy and other exercise programs     Rectal bleeding -onset early July 2025 in the hospital  Plan  - per GI; support procedure  History of pulmonary embolism- small July 2025  Plan  - continue eliquis   -  if bleediong severe have to stop or lower dose  Steroid INduced High sugar  Plan  - per Dr Tawni Hidalgo   [facility]

## 2024-07-28 ENCOUNTER — Encounter: Payer: Self-pay | Admitting: "Endocrinology

## 2024-07-28 ENCOUNTER — Other Ambulatory Visit: Payer: Self-pay | Admitting: "Endocrinology

## 2024-07-28 ENCOUNTER — Telehealth (INDEPENDENT_AMBULATORY_CARE_PROVIDER_SITE_OTHER): Admitting: "Endocrinology

## 2024-07-28 DIAGNOSIS — E1165 Type 2 diabetes mellitus with hyperglycemia: Secondary | ICD-10-CM

## 2024-07-28 DIAGNOSIS — E7800 Pure hypercholesterolemia, unspecified: Secondary | ICD-10-CM

## 2024-07-28 DIAGNOSIS — Z7984 Long term (current) use of oral hypoglycemic drugs: Secondary | ICD-10-CM

## 2024-07-28 DIAGNOSIS — Z7952 Long term (current) use of systemic steroids: Secondary | ICD-10-CM

## 2024-07-28 DIAGNOSIS — Z794 Long term (current) use of insulin: Secondary | ICD-10-CM

## 2024-07-28 NOTE — Patient Instructions (Addendum)
 Will recommend the following: Metformin  XR 500 mg daily 22 units of Lantus . Decrease by 1 unit if blood sugar is less than 100 overnight.   Humalog  10 units before break fast and 12 units before lunch and 4-6 units before supper. Decrease by 1 unit if blood sugar is less than 100 in the daytime.   Humalog  Correction scale: Use in addition to your meal time/short acting insulin  based on blood sugars as follows:  151 - 190: 1 unit 191 - 230: 2 units 231 - 270: 3 units 271 - 310: 4 units 311 - 350: 5 units 351 - 390: 6 units 391 - 430: 7 units   The correction scale can be taken with each meal, as well as at bedtime.  In case of elevated blood sugars above 250 for 2 hours, Humalog  scale can be taken every 2 hours. Call the office if the blood sugar is less than 70 or above 250 despite correction scale  ______   Goals of DM therapy:  Morning Fasting blood sugar: 80-140  Blood sugar before meals: 80-140 Bed time blood sugar: 100-150  A1C <7%, limited only by hypoglycemia  1.Diabetes medications and their side effects discussed, including hypoglycemia    2. Check blood glucose:  a) Always check blood sugars before driving. Please see below (under hypoglycemia) on how to manage b) Check a minimum of 3 times/day or more as needed when having symptoms of hypoglycemia.   c) Try to check blood glucose before sleeping/in the middle of the night to ensure that it is remaining stable and not dropping less than 100 d) Check blood glucose more often if sick  3. Diet: a) 3 meals per day schedule b: Restrict carbs to 60-70 grams (4 servings) per meal c) Colorful vegetables - 3 servings a day, and low sugar fruit 2 servings/day Plate control method: 1/4 plate protein, 1/4 starch, 1/2 green, yellow, or red vegetables d) Avoid carbohydrate snacks unless hypoglycemic episode, or increased physical activity  4. Regular exercise as tolerated, preferably 3 or more hours a week  5.  Hypoglycemia: a)  Do not drive or operate machinery without first testing blood glucose to assure it is over 90 mg%, or if dizzy, lightheaded, not feeling normal, etc, or  if foot or leg is numb or weak. b)  If blood glucose less than 70, take four 5gm Glucose tabs or 15-30 gm Glucose gel.  Repeat every 15 min as needed until blood sugar is >100 mg/dl. If hypoglycemia persists then call 911.   6. Sick day management: a) Check blood glucose more often b) Continue usual therapy if blood sugars are elevated.   7. Contact the doctor immediately if blood glucose is frequently <60 mg/dl, or an episode of severe hypoglycemia occurs (where someone had to give you glucose/  glucagon  or if you passed out from a low blood glucose), or if blood glucose is persistently >350 mg/dl, for further management  8. A change in level of physical activity or exercise and a change in diet may also affect your blood sugar. Check blood sugars more often and call if needed.  Instructions: 1. Bring glucose meter, blood glucose records on every visit for review 2. Continue to follow up with primary care physician and other providers for medical care 3. Yearly eye  and foot exam 4. Please get blood work done prior to the next appointment

## 2024-07-28 NOTE — Addendum Note (Signed)
 Addended by: DARTHA ERNST on: 07/28/2024 11:35 AM   Modules accepted: Level of Service

## 2024-07-28 NOTE — Progress Notes (Signed)
Ordered diabetes education

## 2024-07-28 NOTE — Progress Notes (Signed)
 The patient reports they are currently: Rhonda [name]. I spent total of 30 minutes on the video with audio (after video disrupted) with the patient on the date of service. I spent an additional 45 minutes on pre- and post-visit activities on the date of service.   The patient was physically located in [place]  or a state in which I am permitted to provide care. The patient and/or parent/guardian understood that s/he may incur co-pays and cost sharing, and agreed to the telemedicine visit. The visit was reasonable and appropriate under the circumstances given the patient's presentation at the time.  The patient and/or parent/guardian has been advised of the potential risks and limitations of this mode of treatment (including, but not limited to, the absence of in-person examination) and has agreed to be treated using telemedicine. The patient's/patient's family's questions regarding telemedicine have been answered.   The patient and/or parent/guardian has also been advised to contact their provider's office for worsening conditions, and seek emergency medical treatment and/or call 911 if the patient deems either necessary.    Outpatient Endocrinology Note [name], MD  07/28/24   ALANAH SAKUMA 08/01/1951 [id number]  Referring Provider: Burney Darice CROME, MD Primary Care Provider: Burney Darice CROME, MD Reason for consultation: Subjective   Assessment & Plan  Diagnoses and all orders for this visit:  Uncontrolled type 2 diabetes mellitus with hyperglycemia (HCC)  Long-term insulin  use (HCC)  Long term (current) use of oral hypoglycemic drugs  Pure hypercholesterolemia  On prednisone  therapy    Diabetes Type II complicated by steroid worsened hyperglycemia,  Lab Results  Component Value Date   GFR 72.14 06/14/2024   Hba1c goal less than 7, current Hba1c is 7.2 on 06/25/24 Lab Results  Component Value Date   HGBA1C 7.2 (H) 03/15/2024   Will recommend the  following: Metformin  XR 500 mg daily 22 units of Lantus . Decrease by 1 unit if blood sugar is less than 100 overnight.   Humalog  10 units before break fast and 12 units before lunch and 4-6 units before supper. Decrease by 1 unit if blood sugar is less than 100 in the daytime.   Humalog  Correction scale: Use in addition to your meal time/short acting insulin  based on blood sugars as follows:  151 - 190: 1 unit 191 - 230: 2 units 231 - 270: 3 units 271 - 310: 4 units 311 - 350: 5 units 351 - 390: 6 units 391 - 430: 7 units   The correction scale can be taken with each meal, as well as at bedtime.  In case of elevated blood sugars above 250 for 2 hours, Humalog  scale can be taken every 2 hours. Call the office if the blood sugar is less than 70 or above 250 despite correction scale  Patient is feeing unwell, swelling in legs, on doxycycline  After prednisone  taper, blood sugars actually went high rather than low, likely because of infection and stress 07/28/24: I called for a visit with her today and spent 30-minute to discuss her all the different treatment options.  She does not want to be on the insulin  pump currently and wants to stick to current management. She reports communication issues before but understands that she can message or call, except that message will most likely get turned into a visit in my case so I can address her blood sugars properly and safely.  She had an episode of being afraid 2 days ago when her sugars were high, and did not use her insulin   correction scale. I have addressed her fears and reinforced the treatment verbally and in written, so she is able to address her sugars, which are changing on everyday basis due to changing prednisone  dose, underlying infection and stress.  I have also put in a referral for diabetes educator urgently to keep a check on her.  She is aware that I am going to be out part of this month and most of next month, but she does not want to  switch providers when offered twice and will just reach out to us  as needed if the instructions we have given her fail, so that we can improvise on it. Coordinated care with all the front desk staff, all the medical assistants as well as the manger and patient's pulmonologist.   Skip Humalog  if the blood sugar is less than 70 before meal. Cut the dose of Humalog  in half if the blood sugar is between 71-100 before.  Currently on prednisone  taper  No known contraindications/side effects to any of above medications Glucagon  discussed and prescribed with refills on 07/28/24 Was taken off of insulin  pump due to steroid dose adjustments/ 2 hospitalizations  -Last LD and Tg are as follows: Lab Results  Component Value Date   LDLCALC 102 (H) 07/19/2024    Lab Results  Component Value Date   TRIG 147 07/19/2024   -On pravastatin  40 mg every day (has no S/E) and ezetimibe  10 mg daily. Reports intolerance to stronger statins. Sees cardiologist.  -I recommend pravastatin  80 mg every day -Follow low fat diet and exercise   -Blood pressure goal <140/90 - Microalbumin/creatinine goal is < 30 -Last MA/Cr is as follows: Lab Results  Component Value Date   MICROALBUR 1.9 07/19/2024   -not on ACE/ARB? -diet changes including salt restriction -limit eating outside -counseled BP targets per standards of diabetes care -uncontrolled blood pressure can lead to retinopathy, nephropathy and cardiovascular and atherosclerotic heart disease  Reviewed and counseled on: -A1C target -Blood sugar targets -Complications of uncontrolled diabetes  -Checking blood sugar before meals and bedtime and bring log next visit -All medications with mechanism of action and side effects -Hypoglycemia management: rule of 15's, Glucagon  Emergency Kit and medical alert ID -low-carb low-fat plate-method diet -At least 20 minutes of physical activity per day -Annual dilated retinal eye exam and foot exam -compliance and  follow up needs -follow up as scheduled or earlier if problem gets worse  Call if blood sugar is less than 70 or consistently above 250    Take a 15 gm snack of carbohydrate at bedtime before you go to sleep if your blood sugar is less than 100.    If you are going to fast after midnight for a test or procedure, ask your physician for instructions on how to reduce/decrease your insulin  dose.    Call if blood sugar is less than 70 or consistently above 250  -Treating a low sugar by rule of 15  (15 gms of sugar every 15 min until sugar is more than 70) If you feel your sugar is low, test your sugar to be sure If your sugar is low (less than 70), then take 15 grams of a fast acting Carbohydrate (3-4 glucose tablets or glucose gel or 4 ounces of juice or regular soda) Recheck your sugar 15 min after treating low to make sure it is more than 70 If sugar is still less than 70, treat again with 15 grams of carbohydrate  Don't drive the hour of hypoglycemia  If unconscious/unable to eat or drink by mouth, use glucagon  injection or nasal spray baqsimi  and call 911. Can repeat again in 15 min if still unconscious.  No follow-ups on file.   I have reviewed current medications, nurse's notes, allergies, vital signs, past medical and surgical history, family medical history, and social history for this encounter. Counseled patient on symptoms, examination findings, lab findings, imaging results, treatment decisions and monitoring and prognosis. The patient understood the recommendations and agrees with the treatment plan. All questions regarding treatment plan were fully answered.  [name], MD  07/28/24    History of Present Illness Rhonda [name] is a 73 y.o. year old female who presents for follow up of Type II diabetes mellitus.  TASHIKA GOODIN was first diagnosed in 2017.   Diabetes education +  Home diabetes regimen: Currently on prednisone  taper Metformin  XR 500 mg  daily 22 units of Lantus   Humalog  10 units before break fast and 13-14 units before lunch and 4-6 units before supper Humalog  Correction scale: Use in addition to your meal time/short acting insulin  based on blood sugars as follows:  151 - 190: 1 unit 191 - 230: 2 units 231 - 270: 3 units 271 - 310: 4 units 311 - 350: 5 units 351 - 390: 6 units 391 - 430: 7 units   COMPLICATIONS -  MI/Stroke -  retinopathy -  neuropathy -  nephropathy  BLOOD SUGAR DATA CGM interpretation: At today's visit, we reviewed her CGM downloads. The full report is scanned in the media. Reviewing the CGM trends, BG are well controlled most of the day, with high after lunch and some around midnight.   Physical Exam  There were no vitals taken for this visit.   Constitutional: well developed, well nourished Head: normocephalic, atraumatic Eyes: sclera anicteric, no redness Neck: supple Lungs: normal respiratory effort Neurology: alert and oriented Skin: dry, no appreciable rashes Musculoskeletal: no appreciable defects Psychiatric: normal mood and affect Diabetic Foot Exam - Simple   No data filed      Current Medications Patient's Medications  New Prescriptions   No medications on file  Previous Medications   ALBUTEROL  (PROAIR  HFA) 108 (90 BASE) MCG/ACT INHALER    Inhale 2 puffs into the lungs every 4 (four) hours as needed for wheezing. Or coughing spells.  You may use 2 Puffs 5-10 minutes before exercise.   ALBUTEROL  (PROVENTIL ) (2.5 MG/3ML) 0.083% NEBULIZER SOLUTION    Take 3 mLs (2.5 mg total) by nebulization every 6 (six) hours as needed for wheezing or shortness of breath.   ALPHA-D-GALACTOSIDASE (BEANO PO)    Take 1 tablet by mouth as needed (as directed, if eating onion or garlic).   ALPRAZOLAM  (XANAX ) 0.25 MG TABLET    Take 0.25 mg by mouth at bedtime as needed for anxiety or sleep.   AMBULATORY NON FORMULARY MEDICATION    See admin instructions. Allergy injections on Mondays and hold  when sick   APIXABAN  (ELIQUIS ) 5 MG TABS TABLET    Take 1 tablet (5 mg total) by mouth 2 (two) times daily.   ASCORBIC ACID (VITAMIN C) 500 MG TABLET    Take 500 mg by mouth daily.   ASHWAGANDHA PO    Take 1 capsule by mouth every other day.   AZELASTINE (ASTELIN) 0.1 % NASAL SPRAY    Place 1 spray into both nostrils in the morning. Use in each nostril as directed   CALCIUM  CARB-CHOLECALCIFEROL (  CALCIUM  500 + D PO)    Take 1 tablet by mouth daily.   CHLORPHENIRAMINE (CHLOR-TRIMETON) 4 MG TABLET    Take 2 mg by mouth 2 (two) times daily as needed for allergies or rhinitis.   CHOLECALCIFEROL (VITAMIN D3) 25 MCG (1000 UNIT) TABLET    Take 1,000 Units by mouth every other day.   CONTINUOUS GLUCOSE SENSOR (DEXCOM G7 SENSOR) MISC    Check blood sugar fasting and before meals and at bedtime.   CYCLOSPORINE (RESTASIS) 0.05 % OPHTHALMIC EMULSION    Place 1 drop into both eyes 2 (two) times daily.   DILTIAZEM  (CARDIZEM  CD) 120 MG 24 HR CAPSULE    Take 1 capsule (120 mg total) by mouth daily.   DOXYCYCLINE  (VIBRAMYCIN ) 100 MG CAPSULE    Take 100 mg by mouth every 12 (twelve) hours.   DUPILUMAB  (DUPIXENT ) 300 MG/2ML SOAJ    Inject 300 mg into the skin every 14 (fourteen) days.   EPINEPHRINE  0.3 MG/0.3 ML IJ SOAJ INJECTION    Inject 0.3 mg into the muscle as needed for anaphylaxis.   EZETIMIBE  (ZETIA ) 10 MG TABLET    Take 1 tablet (10 mg total) by mouth daily.   FLUTICASONE  (FLONASE ) 50 MCG/ACT NASAL SPRAY    Spray 2 sprays into each nostril every day.   FUROSEMIDE  (LASIX  PO)    Take 1 tablet by mouth daily.   GLUCAGON  (BAQSIMI  ONE PACK) 3 MG/DOSE POWD    Place 1 Device into the nose as needed (Low blood sugar with impaired consciousness).   HUMALOG  KWIKPEN 100 UNIT/ML KWIKPEN    Inject 8-10 Units into the skin See admin instructions. Inject 10 units into the skin before breakfast & lunch and 8 before supper   HYPROMELLOSE (NATURES TEARS OP)    Place 1 drop into both eyes in the morning and at bedtime.    LANTUS  SOLOSTAR 100 UNIT/ML SOLOSTAR PEN    Inject 100 Units into the skin daily.   LEVOCETIRIZINE (XYZAL ) 5 MG TABLET    Take 5 mg by mouth at bedtime.   METFORMIN  (GLUCOPHAGE -XR) 500 MG 24 HR TABLET    Take 500 mg by mouth daily with breakfast.   METOPROLOL  SUCCINATE (TOPROL -XL) 25 MG 24 HR TABLET    Take 1 tablet (25 mg total) by mouth daily.   MONTELUKAST  (SINGULAIR ) 10 MG TABLET    TAKE 1 TABLET BY MOUTH EVERYDAY AT BEDTIME   MULTIPLE VITAMIN (MULTIVITAMIN) TABLET    Take 1 tablet by mouth daily with breakfast.   MYCOPHENOLATE  (CELLCEPT ) 500 MG TABLET    Take 2 tablets (1,000 mg total) by mouth 2 (two) times daily.   NITROGLYCERIN  (NITROGLYN) 2 % OINTMENT    Apply 0.25 inches topically 2 (two) times daily. Apply To anus and surrounding tissue. Use pea size amount   NITROGLYCERIN  (NITROSTAT ) 0.4 MG SL TABLET    Place 1 tablet (0.4 mg total) under the tongue every 5 (five) minutes as needed for chest pain.   NYSTATIN  CREAM (MYCOSTATIN )    Apply 1 Application topically 2 (two) times daily as needed (for irritation- affected sites).   OMEPRAZOLE  (PRILOSEC  OTC) 20 MG TABLET    Take 10 mg by mouth See admin instructions. Take 10 mg by mouth at 4:30 AM   OXYGEN     Inhale 4-5 L/min into the lungs See admin instructions. Inhale 4-5 L/min at bedtime and as needed for shortness of breath during the day   POTASSIUM CHLORIDE  (KLOR-CON  10) 10 MEQ TABLET  Take 1 tablet (10 mEq total) by mouth daily.   PRAVASTATIN  (PRAVACHOL ) 40 MG TABLET    Take 1 tablet (40 mg total) by mouth every evening.   PREDNISONE  (DELTASONE ) 10 MG TABLET    50 mg daily  x 2 weeks and then 40 mg/day and stay there till further instructions   PREPARATION H 0.25-50 % GEL    Place 1 application  rectally 2 (two) times daily as needed (for hemorrhoids).   REVEFENACIN  (YUPELRI ) 175 MCG/3ML NEBULIZER SOLUTION    Inhale 1 vial by nebulization daily.   RITUXIMAB -PVVR (RUXIENCE  IV)    Inject into the vein. Infuse 1000mg  at Day 0 and Day 14.  Repeat cycle every 6 months   SODIUM CHLORIDE  (OCEAN) 0.65 % SOLN NASAL SPRAY    Place 1 spray into both nostrils as needed for congestion.   SODIUM CHLORIDE , INHALANT, 7 % NEBU    Use per nebulizer once a day to help clear sputum   SULFAMETHOXAZOLE -TRIMETHOPRIM  (BACTRIM  DS) 800-160 MG TABLET    Take 1 tablet by mouth every Monday, Wednesday, and Friday.  Modified Medications   No medications on file  Discontinued Medications   No medications on file    Allergies Allergies  Allergen Reactions   Ozempic (0.25 Or 0.5 Mg-Dose) [Semaglutide(0.25 Or 0.5mg -Dos)] Other (See Comments)    Paralyzed the stomach   Tape Other (See Comments)    SKIN IS VERY FRAGILE!! Burns and pulls up the skin!! SKIN BRUISES and TEARS EASILY.SABRA   Betadine [Povidone Iodine] Other (See Comments)    Blisters    Codeine Nausea And Vomiting   Garlic Diarrhea   Hydrocodone  Nausea And Vomiting   Iodine Other (See Comments)    Blisters    Macrobid [Nitrofurantoin] Other (See Comments)    Instructed by dr not to take    Ofev  [Nintedanib] Other (See Comments)    Abdominal pain   Onion Diarrhea   Shellfish Allergy Nausea And Vomiting   Statins Other (See Comments)    Leg pain, but Pravachol  is tolerated   Sulfa  Antibiotics Other (See Comments)    Headaches     Past Medical History Past Medical History:  Diagnosis Date   Allergy    Arthritis    history spinal stenosis. osteoarthritis right hip   Asthma    Cataract    Coronary artery calcification seen on CAT scan 08/19/2017   >300 on CT scan 08/2017   DDD (degenerative disc disease), thoracic    Depression    DOE (dyspnea on exertion)    a. 04/2010 Lexi MV EF 71%, no ischemia/infarct;     Fatty liver    GERD (gastroesophageal reflux disease)    H/O steroid therapy    Steroid use orally over 4 yrs- for Lung Fibrosis   Helicobacter pylori ab+    Hemorrhoids    Hiatal hernia    High cholesterol    History of chronic bronchitis    as child    History of migraine    History of MRSA infection    Hyperlipidemia, mixed 08/19/2017   Hyperplastic colon polyp 2007   IBS (irritable bowel syndrome)    Inguinal hernia    right   Insulin  resistance    past   Interstitial lung disease (HCC)    MVP (mitral valve prolapse)    Posterior mitral valve leaflet with mild MR   Pneumonia    Pneumonitis, hypersensitivity (HCC)    a. 09/2012 s/p Bx - ? 2/2 bird, mold, oil  paint exposure ->on steroids, followed by pulm.   PONV (postoperative nausea and vomiting)    Pre-diabetes    takes Metformin    Pulmonary embolism (HCC)    Pulmonary fibrosis (HCC)    Dr. Geronimo follows- stable at present   PVC (premature ventricular contraction) 08/19/2017   Rapid heart rate    Dr. Shlomo follows- last visit Epic note 9'16   Squamous cell carcinoma of skin    Tinnitus, right ear    Vocal cord ulcer     Past Surgical History Past Surgical History:  Procedure Laterality Date   73 HOUR PH STUDY N/A 02/21/2018   Procedure: 24 HOUR PH STUDY;  Surgeon: Shila Gustav GAILS, MD;  Location: [facility];  Service: Endoscopy;  Laterality: N/A;   BREAST BIOPSY Right 2009   BIOPSY, pt denies   BREAST BIOPSY Left 2003   Benign    CATARACT EXTRACTION Left    CESAREAN SECTION     COLONOSCOPY     COLONOSCOPY N/A 05/28/2024   Procedure: COLONOSCOPY;  Surgeon: Albertus Gordy HERO, MD;  Location: [facility];  Service: Gastroenterology;  Laterality: N/A;   ESOPHAGEAL MANOMETRY N/A 02/21/2018   Procedure: ESOPHAGEAL MANOMETRY (EM);  Surgeon: Shila Gustav GAILS, MD;  Location: [facility];  Service: Endoscopy;  Laterality: N/A;   FOOT FRACTURE SURGERY  2006 or 2007   right   HYMENECTOMY     LUNG BIOPSY  09/28/2012   Procedure: LUNG BIOPSY;  Surgeon: Elspeth JAYSON Millers, MD;  [facility] OR;  Service: Thoracic;  Laterality: N/A;  lung biopsies tims three   RIGHT HEART CATH N/A 04/04/2024   Procedure: RIGHT HEART CATH;  Surgeon: Rolan Ezra RAMAN, MD;  Location: [facility]  INVASIVE CV LAB;  Service: Cardiovascular;  Laterality: N/A;   SQUAMOUS CELL CARCINOMA EXCISION Left    left arm   TOTAL HIP ARTHROPLASTY Right 09/10/2015   Procedure: RIGHT TOTAL HIP ARTHROPLASTY ANTERIOR APPROACH;  Surgeon: Donnice Car, MD;  Location: [facility];  Service: Orthopedics;  Laterality: Right;   TOTAL HIP ARTHROPLASTY Left 10/03/2019   Procedure: TOTAL HIP ARTHROPLASTY ANTERIOR APPROACH;  Surgeon: Car Donnice, MD;  Location: [facility];  Service: Orthopedics;  Laterality: Left;  70 mins   TUBAL LIGATION     UPPER GI ENDOSCOPY     VIDEO ASSISTED THORACOSCOPY  09/28/2012   Procedure: VIDEO ASSISTED THORACOSCOPY;  Surgeon: Elspeth JAYSON Millers, MD;  Location: [facility] OR;  Service: Thoracic;  Laterality: Right;   VIDEO BRONCHOSCOPY  11/19/2011   Procedure: VIDEO BRONCHOSCOPY WITH FLUORO;  Surgeon: Dorethia Geronimo, MD;  Location: [facility] ENDOSCOPY;  Service: Endoscopy;;   VIDEO BRONCHOSCOPY Bilateral 05/23/2024   Procedure: VIDEO BRONCHOSCOPY WITHOUT FLUORO;  Surgeon: Mannam, Praveen, MD;  Location: [facility];  Service: Cardiopulmonary;  Laterality: Bilateral;    Family History family history includes Allergic rhinitis in her daughter; Asthma in her maternal grandmother and mother; Bone cancer in her paternal grandfather; Breast cancer in her cousin, cousin, cousin, paternal aunt, and paternal aunt; Dementia (age of onset: 88) in her mother; Diabetes in her father; Emphysema in her maternal grandmother; Heart disease in her sister; Hypertension in her sister; Kidney disease in her sister; Lung disease in her maternal grandfather; Lymphoma in her father; Osteoarthritis in her mother; Ovarian cancer in her maternal aunt.  Social History Social History   Socioeconomic History   Marital status: Married    Spouse name: [name]   Number of children: 1   Years of education: Not on file   Highest education  level: Master's degree (e.g., MA, MS, MEng, MEd, MSW, MBA)  Occupational History   Occupation:  SELF EMPLOYED    Comment: Careers information officer, retired   Occupation: retired    Associate Professor: [employer]  Tobacco Use   Smoking status: Never   Smokeless tobacco: Never   Tobacco comments:    pt states she experimented in Designer, multimedia   Vaping status: Never Used  Substance and Sexual Activity   Alcohol use: Not Currently    Alcohol/week: 2.0 standard drinks of alcohol    Types: 1 Glasses of wine, 1 Standard drinks or equivalent per week   Drug use: No   Sexual activity: Never    Birth control/protection: None    Comment: sex partners in the last 12 months 0  Other Topics Concern   Not on file  Social History Narrative   Married; Doctor, general practice      Exercise - swimming daily for 20-30 minutes   Patient was started her periods at 73 year old (regular periods), and painful periods      Social Drivers of Corporate investment banker Strain: Not on file  Food Insecurity: No Food Insecurity (05/23/2024)   Hunger Vital Sign    Worried About Running Out of Food in the Last Year: Never true    Ran Out of Food in the Last Year: Never true  Transportation Needs: No Transportation Needs (05/23/2024)   PRAPARE - Administrator, Civil Service (Medical): No    Lack of Transportation (Non-Medical): No  Physical Activity: Not on file  Stress: Not on file  Social Connections: Socially Integrated (05/23/2024)   Social Connection and Isolation Panel    Frequency of Communication with Friends and Family: More than three times a week    Frequency of Social Gatherings with Friends and Family: More than three times a week    Attends Religious Services: 1 to 4 times per year    Active Member of Clubs or Organizations: No    Attends Engineer, structural: More than 4 times per year    Marital Status: Married  Catering manager Violence: Not At Risk (05/23/2024)   Humiliation, Afraid, Rape, and Kick questionnaire    Fear of Current or Ex-Partner: No     Emotionally Abused: No    Physically Abused: No    Sexually Abused: No    Lab Results  Component Value Date   HGBA1C 7.2 (H) 03/15/2024   HGBA1C 6.2 (H) 09/28/2019   HGBA1C 6.0 (H) 12/08/2018   Lab Results  Component Value Date   CHOL 220 (H) 07/19/2024   Lab Results  Component Value Date   HDL 94 07/19/2024   Lab Results  Component Value Date   LDLCALC 102 (H) 07/19/2024   Lab Results  Component Value Date   TRIG 147 07/19/2024   Lab Results  Component Value Date   CHOLHDL 2.3 07/19/2024   Lab Results  Component Value Date   CREATININE 0.84 07/19/2024   Lab Results  Component Value Date   GFR 72.14 06/14/2024   Lab Results  Component Value Date   MICROALBUR 1.9 07/19/2024      Component Value Date/Time   NA 142 07/19/2024 0839   NA 142 12/08/2018 0917   K 3.5 07/19/2024 0839   CL 105 07/19/2024 0839   CO2 30 07/19/2024 0839   GLUCOSE 109 (H) 07/19/2024 0839   BUN 29 (H) 07/19/2024 0839   BUN 18  12/08/2018 0917   CREATININE 0.84 07/19/2024 0839   CALCIUM  8.8 07/19/2024 0839   PROT 5.5 (L) 07/19/2024 0839   PROT 6.5 12/08/2018 0917   ALBUMIN 3.9 06/14/2024 1011   ALBUMIN 3.9 12/08/2018 0917   AST 18 07/19/2024 0839   ALT 27 07/19/2024 0839   ALKPHOS 38 (L) 06/14/2024 1011   BILITOT 0.4 07/19/2024 0839   BILITOT 0.4 12/08/2018 0917   GFRNONAA >60 05/30/2024 0823   GFRNONAA 66 02/26/2015 1540   GFRAA >60 10/04/2019 0216   GFRAA 77 02/26/2015 1540      Latest Ref Rng & Units 07/19/2024    8:39 AM 06/14/2024   10:11 AM 06/06/2024   12:13 PM  BMP  Glucose 65 - 99 mg/dL 890  55  810   BUN 7 - 25 mg/dL 29  35  37   Creatinine 0.60 - 1.00 mg/dL 9.15  9.19  9.02   BUN/Creat Ratio 6 - 22 (calc) 35     Sodium 135 - 146 mmol/L 142  140  134   Potassium 3.5 - 5.3 mmol/L 3.5  3.6  5.3 No hemolysis seen   Chloride 98 - 110 mmol/L 105  105  101   CO2 20 - 32 mmol/L 30  27  28    Calcium  8.6 - 10.4 mg/dL 8.8  9.1  9.6        Component Value Date/Time    WBC 12.9 (H) 07/10/2024 1418   RBC 4.43 07/10/2024 1418   HGB 13.0 07/10/2024 1418   HCT 40.6 07/10/2024 1418   PLT 215.0 07/10/2024 1418   MCV 91.7 07/10/2024 1418   MCH 28.4 05/30/2024 0823   MCHC 32.1 07/10/2024 1418   RDW 20.0 (H) 07/10/2024 1418   LYMPHSABS 0.7 07/10/2024 1418   MONOABS 0.3 07/10/2024 1418   EOSABS 0.0 07/10/2024 1418   BASOSABS 0.0 07/10/2024 1418     Parts of this note may have been dictated using voice recognition software. There may be variances in spelling and vocabulary which are unintentional. Not all errors are proofread. Please notify the dino if any discrepancies are noted or if the meaning of any statement is not clear.

## 2024-07-31 ENCOUNTER — Encounter: Payer: Self-pay | Admitting: "Endocrinology

## 2024-08-01 ENCOUNTER — Other Ambulatory Visit: Payer: Self-pay

## 2024-08-01 ENCOUNTER — Encounter (HOSPITAL_COMMUNITY)

## 2024-08-02 ENCOUNTER — Encounter: Attending: "Endocrinology | Admitting: Skilled Nursing Facility1

## 2024-08-02 ENCOUNTER — Encounter: Payer: Self-pay | Admitting: Skilled Nursing Facility1

## 2024-08-02 DIAGNOSIS — Z794 Long term (current) use of insulin: Secondary | ICD-10-CM | POA: Insufficient documentation

## 2024-08-02 DIAGNOSIS — E119 Type 2 diabetes mellitus without complications: Secondary | ICD-10-CM | POA: Insufficient documentation

## 2024-08-02 DIAGNOSIS — E1165 Type 2 diabetes mellitus with hyperglycemia: Secondary | ICD-10-CM | POA: Diagnosis present

## 2024-08-02 NOTE — Progress Notes (Signed)
 Pt states she is currently working on reducing the prednisone  stating it will never be 0 because she has been taking it for decades and needs if for her other conditions. Pt states she is going to restart the lung transplant pathway.  Pt arrived with her neighbor and the use of an oxygen  tank.  Pt state she started using insulin  several months ago.  Pt states they are working on coming down off the prednisone .  Pt states she does have a Dexcom.  Pt states one of her medications causes 10 bowel movements per day so she takes an imodium which slows it dow to 5 per day.  Pt states she notices green leafs mess with her stomach.   Lantus  23 units  Humalog : breakfast 10 units, lunch 12, 4-6 for dinner   Pt states she is allergic to garlic and onion.   Pt states she will wake up in the middle of the night when she goes to the restroom.    Goals: Take the amount of insulin  prescribed  Get some over the counter ketone test strips if moderate to large ketones contact your doctor  [place] job on your diet!  CGM Results from download:   % Time CGM active:    %   (Goal >70%)  Average glucose:   168 mg/dL for  days  Glucose management indicator:   7.3 %  Time in range (70-180 mg/dL):   62 %   (Goal >29%)  Time High (181-250 mg/dL):   31 %   (Goal < 74%)  Time Very High (>250 mg/dL):    7 %   (Goal < 5%)  Time Low (54-69 mg/dL):    %   (Goal <5%)  Time Very Low (<54 mg/dL):   1 %   (Goal <8%)  %CV (glucose variability)     %  (Goal <36%)   Breakfast: chia seed pudding + greek yogurt + fruit + almond milk + Daves killer bread toast + egg + 1/2 avacado sometimes canadian bacon  Snack: half protein bar  Lunch: chicken salad + celery  Snack: Dinner: egg plant parm + cheese or turley meatballs + toamtoes  Diabetes Self-Management Education  Visit Type: First/Initial  Appt. Start Time: 9:54 Appt. End Time: 11:00  08/02/2024  Ms. Rhonda [name], identified by name and date of birth, is a 73 y.o.  female with a diagnosis of Diabetes: Type 2.   ASSESSMENT  There were no vitals taken for this visit. There is no height or weight on file to calculate BMI.   Diabetes Self-Management Education - 08/02/24 1509       Visit Information   Visit Type First/Initial      Initial Visit   Diabetes Type Type 2    Are you currently following a meal plan? No    Are you taking your medications as prescribed? No      Health Coping   How would you rate your overall health? Good      Psychosocial Assessment   Patient Belief/Attitude about Diabetes Motivated to manage diabetes    What is the hardest part about your diabetes right now, causing you the most concern, or is the most worrisome to you about your diabetes?   Making healty food and beverage choices    Self-care barriers None    Self-management support Friends;Family    Other persons present Friend    Patient Concerns Nutrition/Meal planning;Healthy Lifestyle    Special Needs None  Preferred Learning Style Visual;Auditory    Learning Readiness Change in progress    How often do you need to have someone help you when you read instructions, pamphlets, or other written materials from your doctor or pharmacy? 1 - Never      Pre-Education Assessment   Patient understands the diabetes disease and treatment process. Needs Instruction    Patient understands incorporating nutritional management into lifestyle. Needs Instruction    Patient undertands incorporating physical activity into lifestyle. Needs Instruction    Patient understands using medications safely. Needs Instruction    Patient understands monitoring blood glucose, interpreting and using results Needs Instruction    Patient understands prevention, detection, and treatment of acute complications. Needs Instruction    Patient understands prevention, detection, and treatment of chronic complications. Needs Instruction    Patient understands how to develop strategies to address  psychosocial issues. Needs Instruction    Patient understands how to develop strategies to promote health/change behavior. Needs Instruction      Complications   Last HgB A1C per patient/outside source 7.2 %    How often do you check your blood sugar? 3-4 times/day    Fasting Blood glucose range (mg/dL) 869-820;29-870    Postprandial Blood glucose range (mg/dL) 869-820;819-799;>799    Number of hypoglycemic episodes per month 0    Number of hyperglycemic episodes ( >200mg /dL): Weekly    Have you had a dilated eye exam in the past 12 months? Yes    Have you had a dental exam in the past 12 months? Yes    Are you checking your feet? Yes    How many days per week are you checking your feet? 7      Activity / Exercise   Activity / Exercise Type ADL's    How many days per week do you exercise? 0    How many minutes per day do you exercise? 0    Total minutes per week of exercise 0      Patient Education   Previous Diabetes Education Yes    Disease Pathophysiology Definition of diabetes, type 1 and 2, and the diagnosis of diabetes    Healthy Eating Role of diet in the treatment of diabetes and the relationship between the three main macronutrients and blood glucose level;Food label reading, portion sizes and measuring food.;Plate Method;Carbohydrate counting;Reviewed blood glucose goals for pre and post meals and how to evaluate the patients' food intake on their blood glucose level.;Meal timing in regards to the patients' current diabetes medication.;Meal options for control of blood glucose level and chronic complications.    Medications Taught/reviewed insulin /injectables, injection, site rotation, insulin /injectables storage and needle disposal.;Reviewed patients medication for diabetes, action, purpose, timing of dose and side effects.    Monitoring Taught/evaluated CGM (comment);Daily foot exams;Yearly dilated eye exam;Identified appropriate SMBG and/or A1C goals.;Interpreting lab values -  A1C, lipid, urine microalbumina.    Acute complications Taught prevention, symptoms, and  treatment of hypoglycemia - the 15 rule.;Discussed and identified patients' prevention, symptoms, and treatment of hyperglycemia.    Chronic complications Dental care;Retinopathy and reason for yearly dilated eye exams;Nephropathy, what it is, prevention of, the use of ACE, ARB's and early detection of through urine microalbumia.;Assessed and discussed foot care and prevention of foot problems;Lipid levels, blood glucose control and heart disease;Relationship between chronic complications and blood glucose control    Diabetes Stress and Support Role of stress on diabetes;Identified and addressed patients feelings and concerns about diabetes      Individualized Goals (  developed by patient)   Nutrition Follow meal plan discussed;General guidelines for healthy choices and portions discussed    Problem Solving Eating Pattern      Post-Education Assessment   Patient understands the diabetes disease and treatment process. Demonstrates understanding / competency    Patient understands incorporating nutritional management into lifestyle. Demonstrates understanding / competency    Patient undertands incorporating physical activity into lifestyle. Demonstrates understanding / competency    Patient understands using medications safely. Demonstrates understanding / competency    Patient understands monitoring blood glucose, interpreting and using results Demonstrates understanding / competency    Patient understands prevention, detection, and treatment of acute complications. Demonstrates understanding / competency    Patient understands prevention, detection, and treatment of chronic complications. Demonstrates understanding / competency    Patient understands how to develop strategies to address psychosocial issues. Demonstrates understanding / competency    Patient understands how to develop strategies to promote  health/change behavior. Demonstrates understanding / competency      Outcomes   Expected Outcomes Demonstrated interest in learning. Expect positive outcomes    Future DMSE 2 wks    Program Status Completed          Individualized Plan for Diabetes Self-Management Training:   Learning Objective:  Patient will have a greater understanding of diabetes self-management. Patient education plan is to attend individual and/or group sessions per assessed needs and concerns.    Expected Outcomes:  Demonstrated interest in learning. Expect positive outcomes  Education material provided: ADA - How to Thrive: A Guide for Your Journey with Diabetes, A1C conversion sheet, Meal plan card, My Plate, and Snack sheet  If problems or questions, patient to contact team via:  Phone and Email  Future DSME appointment: 2 wks

## 2024-08-08 ENCOUNTER — Encounter: Payer: Self-pay | Admitting: Internal Medicine

## 2024-08-08 ENCOUNTER — Ambulatory Visit: Admitting: Internal Medicine

## 2024-08-08 VITALS — BP 120/60 | HR 75 | Ht 59.0 in | Wt 148.4 lb

## 2024-08-08 DIAGNOSIS — K648 Other hemorrhoids: Secondary | ICD-10-CM

## 2024-08-08 DIAGNOSIS — K219 Gastro-esophageal reflux disease without esophagitis: Secondary | ICD-10-CM | POA: Diagnosis not present

## 2024-08-08 DIAGNOSIS — R109 Unspecified abdominal pain: Secondary | ICD-10-CM | POA: Diagnosis not present

## 2024-08-08 DIAGNOSIS — K641 Second degree hemorrhoids: Secondary | ICD-10-CM | POA: Diagnosis not present

## 2024-08-08 DIAGNOSIS — K602 Anal fissure, unspecified: Secondary | ICD-10-CM | POA: Diagnosis not present

## 2024-08-08 DIAGNOSIS — R12 Heartburn: Secondary | ICD-10-CM

## 2024-08-08 MED ORDER — OMEPRAZOLE 20 MG PO CPDR
20.0000 mg | DELAYED_RELEASE_CAPSULE | Freq: Two times a day (BID) | ORAL | 5 refills | Status: DC
Start: 2024-08-08 — End: 2024-09-18

## 2024-08-08 NOTE — Patient Instructions (Addendum)
 HEMORRHOID BANDING PROCEDURE    FOLLOW-UP CARE   The procedure you have had should have been relatively painless since the banding of the area involved does not have nerve endings and there is no pain sensation.  The rubber band cuts off the blood supply to the hemorrhoid and the band may fall off as soon as 48 hours after the banding (the band may occasionally be seen in the toilet bowl following a bowel movement). You may notice a temporary feeling of fullness in the rectum which should respond adequately to plain Tylenol  or Motrin.  Following the banding, avoid strenuous exercise that evening and resume full activity the next day.  A sitz bath (soaking in a warm tub) or bidet is soothing, and can be useful for cleansing the area after bowel movements.     To avoid constipation, take two tablespoons of natural wheat bran, natural oat bran, flax, Benefiber or any over the counter fiber supplement and increase your water  intake to 7-8 glasses daily.    Unless you have been prescribed anorectal medication, do not put anything inside your rectum for two weeks: No suppositories, enemas, fingers, etc.  Occasionally, you may have more bleeding than usual after the banding procedure.  This is often from the untreated hemorrhoids rather than the treated one.  Don't be concerned if there is a tablespoon or so of blood.  If there is more blood than this, lie flat with your bottom higher than your head and apply an ice pack to the area. If the bleeding does not stop within a half an hour or if you feel faint, call our office at [phone] or go to the emergency room.  Problems are not common; however, if there is a substantial amount of bleeding, severe pain, chills, fever or difficulty passing urine (very rare) or other problems, you should call us  at [phone] or report to the nearest emergency room.  Do not stay seated continuously for more than 2-3 hours for a day or two after the  procedure.  Tighten your buttock muscles 10-15 times every two hours and take 10-15 deep breaths every 1-2 hours.  Do not spend more than a few minutes on the toilet if you cannot empty your bowel; instead re-visit the toilet at a later time.  We have sent the following medications to your pharmacy for you to pick up at your convenience: omeprazole  20 mg twice daily.   Take dicyclomine  10 mg as needed.   _______________________________________________________  If your blood pressure at your visit was 140/90 or greater, please contact your primary care physician to follow up on this.  _______________________________________________________  If you are age 8 or older, your body mass index should be between 23-30. Your Body mass index is 29.97 kg/m. If this is out of the aforementioned range listed, please consider follow up with your Primary Care Provider.  If you are age 16 or younger, your body mass index should be between 19-25. Your Body mass index is 29.97 kg/m. If this is out of the aformentioned range listed, please consider follow up with your Primary Care Provider.   ________________________________________________________  The [facility] providers would like to encourage you to use MYCHART to communicate with providers for non-urgent requests or questions.  Due to long hold times on the telephone, sending your provider a message by [facility] may be a faster and more efficient way to get a response.  Please allow 48 business hours for a  response.  Please remember that this is for non-urgent requests.  _______________________________________________________  [facility] is using a team-based approach to care.  Your team is made up of your doctor and two to three APPS. Our APPS (Nurse Practitioners and Physician Assistants) work with your physician to ensure care continuity for you. They are fully qualified to address your health concerns and develop a treatment plan. They  communicate directly with your gastroenterologist to care for you. Seeing the Advanced Practice Practitioners on your physician's team can help you by facilitating care more promptly, often allowing for earlier appointments, access to diagnostic testing, procedures, and other specialty referrals.

## 2024-08-08 NOTE — Progress Notes (Signed)
 Subjective:    Patient ID: Rhonda [name], female    DOB: 09-Oct-1951, 73 y.o.   MRN: [id number]  HPI Rhonda [name] is a 73 year old female who presents for additional hemorrhoidal banding but notes severe stomach upset, looser stools and worsening reflux in the setting of her CellCept .    She experiences significant gastrointestinal upset, which she attributes to her use of Cellcept . She describes severe stomach pain and cramping, feeling bloated, and having frequent stools, although the frequency has decreased since she stopped taking Cellcept  on an empty stomach. She notes that the medication affects her gastrointestinal tract and causes significant pain.  She manages diarrhea with Imodium but has since stopped its use. She also uses dicyclomine , which helps with the symptoms, but she experienced dizziness when the dose was increased from 10 mg to 20 mg. She has since reduced the dose back to 10 mg.  She experiences reflux that starts around 1:00 AM, accompanied by nausea. She has been taking over-the-counter Prilosec , initially at half a dose, but now at a full 20 mg dose. Despite this, she continues to experience symptoms, particularly at night. She mentions trying to manage the symptoms by adding food, such as crackers, to her regimen.  She did very well with the initial hemorrhoidal banding.  No bleeding since.  Fissure has healed and she stopped using the nitroglycerin  ointment.  Hemorrhoidal symptoms prior to banding: - Frequent bleeding and prolapse  Review of Systems As per HPI, otherwise negative  Current Medications, Allergies, Past Medical History, Past Surgical History, Family History and Social History were reviewed in [employer] record.    Objective:   Physical Exam BP 120/60   Pulse 75   Ht 4' 11 (1.499 m)   Wt 148 lb 6 oz (67.3 kg)   BMI 29.97 kg/m  Gen: awake, alert, NAD HEENT: anicteric  Abd: soft, NT/ND, +BS  throughout Ext: no c/c/e Neuro: nonfocal      Assessment & Plan:    Symptomatic internal hemorrhoids with bleeding - Excellent response to initial hemorrhoidal banding of the right posterior internal hemorrhoid on 07/20/2024 - She wishes to proceed   PROCEDURE NOTE:  The patient presents with symptomatic grade 2 internal hemorrhoids, requesting rubber band ligation of her hemorrhoidal disease.  All risks, benefits and alternative forms of therapy were described and informed consent was obtained.   The anorectum was pre-medicated with 0.125% nitroglycerin  ointment The decision was made to band the LL (RP banded at visit #1) internal hemorrhoid, and the [facility] System was used to perform band ligation without complication.   Digital anorectal examination was then performed to assure proper positioning of the band, and to adjust the banded tissue as required.  The patient was discharged home without pain or other issues.  Dietary and behavioral recommendations were given and along with follow-up instructions.     The patient will return as scheduled for follow-up and possible additional banding as required. No complications were encountered and the patient tolerated the procedure well.   2. Gastrointestinal pain and cramping due to mycophenolate  mofetil (CellCept ) Symptoms improved with dietary changes and dicyclomine . Discussed balancing symptom control with dizziness side effect. - Continue dicyclomine  10 mg as needed for gastrointestinal symptoms. - Avoid increasing dicyclomine  dose to prevent dizziness.  3.  Gastroesophageal reflux disease (GERD) Inadequate symptom control with current omeprazole  regimen. Discussed increasing dosage. - Prescribe omeprazole  20 mg twice daily for better symptom control.  4.  Anal fissure -- He had with nitroglycerin .  Can discontinue.  Can be reused in the future as needed.  She will return as scheduled for hemorrhoidal banding.  #3  30  minutes total spent today including patient facing time, coordination of care, reviewing medical history/procedures/pertinent radiology studies, and documentation of the encounter.

## 2024-08-09 ENCOUNTER — Other Ambulatory Visit

## 2024-08-09 ENCOUNTER — Encounter: Payer: Self-pay | Admitting: Internal Medicine

## 2024-08-09 ENCOUNTER — Telehealth: Payer: Self-pay

## 2024-08-09 DIAGNOSIS — Z79899 Other long term (current) drug therapy: Secondary | ICD-10-CM

## 2024-08-09 LAB — HEPATIC FUNCTION PANEL
ALT: 28 U/L (ref 0–35)
AST: 21 U/L (ref 0–37)
Albumin: 3.7 g/dL (ref 3.5–5.2)
Alkaline Phosphatase: 43 U/L (ref 39–117)
Bilirubin, Direct: 0 mg/dL (ref 0.0–0.3)
Total Bilirubin: 0.4 mg/dL (ref 0.2–1.2)
Total Protein: 5.8 g/dL — ABNORMAL LOW (ref 6.0–8.3)

## 2024-08-09 LAB — BASIC METABOLIC PANEL WITH GFR
BUN: 31 mg/dL — ABNORMAL HIGH (ref 6–23)
CO2: 25 meq/L (ref 19–32)
Calcium: 9.3 mg/dL (ref 8.4–10.5)
Chloride: 102 meq/L (ref 96–112)
Creatinine, Ser: 1.04 mg/dL (ref 0.40–1.20)
GFR: 52.6 mL/min — ABNORMAL LOW (ref >60.00)
Glucose, Bld: 134 mg/dL — ABNORMAL HIGH (ref 70–99)
Potassium: 5 meq/L (ref 3.5–5.1)
Sodium: 137 meq/L (ref 135–145)

## 2024-08-09 LAB — CBC WITH DIFFERENTIAL/PLATELET
Basophils Absolute: 0 K/uL (ref 0.0–0.1)
Basophils Relative: 0.3 % (ref 0.0–3.0)
Eosinophils Absolute: 0 K/uL (ref 0.0–0.7)
Eosinophils Relative: 0.1 % (ref 0.0–5.0)
HCT: 41.5 % (ref 36.0–46.0)
Hemoglobin: 13.1 g/dL (ref 12.0–15.0)
Lymphocytes Relative: 6.2 % — ABNORMAL LOW (ref 12.0–46.0)
Lymphs Abs: 0.8 K/uL (ref 0.7–4.0)
MCHC: 31.5 g/dL (ref 30.0–36.0)
MCV: 91.7 fl (ref 78.0–100.0)
Monocytes Absolute: 0.3 K/uL (ref 0.1–1.0)
Monocytes Relative: 2.4 % — ABNORMAL LOW (ref 3.0–12.0)
Neutro Abs: 11.2 K/uL — ABNORMAL HIGH (ref 1.4–7.7)
Neutrophils Relative %: 91 % — ABNORMAL HIGH (ref 43.0–77.0)
Platelets: 228 K/uL (ref 150.0–400.0)
RBC: 4.53 Mil/uL (ref 3.87–5.11)
RDW: 17.7 % — ABNORMAL HIGH (ref 11.5–15.5)
WBC: 12.4 K/uL — ABNORMAL HIGH (ref 4.0–10.5)

## 2024-08-09 NOTE — Telephone Encounter (Signed)
 Handled in separate encounter.NFN

## 2024-08-09 NOTE — Telephone Encounter (Signed)
 Copied from CRM #[id number]. Topic: Clinical - Request for Lab/Test Order >> Aug 09, 2024  8:52 AM Nathanel DEL wrote: Reason for CRM: pt takes celecept and dr Geronimo needs her to do lab work and he told her to come w/in 2 weeks, and this is week 2,  can you please place orders, and call pt when done?     Pt will need to do a CBC, BMET, LFT per secure chat from Dr Geronimo. Labs have been ordered and pt was informed. Pt is scheduled for lab work today at 3:00 pm. NFN

## 2024-08-10 ENCOUNTER — Ambulatory Visit: Payer: Self-pay | Admitting: Internal Medicine

## 2024-08-14 ENCOUNTER — Ambulatory Visit: Payer: Self-pay | Admitting: Internal Medicine

## 2024-08-14 ENCOUNTER — Other Ambulatory Visit (INDEPENDENT_AMBULATORY_CARE_PROVIDER_SITE_OTHER)

## 2024-08-14 DIAGNOSIS — R109 Unspecified abdominal pain: Secondary | ICD-10-CM

## 2024-08-14 DIAGNOSIS — R509 Fever, unspecified: Secondary | ICD-10-CM

## 2024-08-14 LAB — COMPREHENSIVE METABOLIC PANEL WITH GFR
ALT: 23 U/L (ref 0–35)
AST: 17 U/L (ref 0–37)
Albumin: 3.8 g/dL (ref 3.5–5.2)
Alkaline Phosphatase: 44 U/L (ref 39–117)
BUN: 29 mg/dL — ABNORMAL HIGH (ref 6–23)
CO2: 29 meq/L (ref 19–32)
Calcium: 9.2 mg/dL (ref 8.4–10.5)
Chloride: 100 meq/L (ref 96–112)
Creatinine, Ser: 1.23 mg/dL — ABNORMAL HIGH (ref 0.40–1.20)
GFR: 43 mL/min — ABNORMAL LOW (ref >60.00)
Glucose, Bld: 139 mg/dL — ABNORMAL HIGH (ref 70–99)
Potassium: 4 meq/L (ref 3.5–5.1)
Sodium: 136 meq/L (ref 135–145)
Total Bilirubin: 0.3 mg/dL (ref 0.2–1.2)
Total Protein: 6 g/dL (ref 6.0–8.3)

## 2024-08-14 LAB — MAGNESIUM: Magnesium: 2 mg/dL (ref 1.5–2.5)

## 2024-08-14 LAB — CBC WITH DIFFERENTIAL/PLATELET
Basophils Absolute: 0 K/uL (ref 0.0–0.1)
Basophils Relative: 0.2 % (ref 0.0–3.0)
Eosinophils Absolute: 0 K/uL (ref 0.0–0.7)
Eosinophils Relative: 0.2 % (ref 0.0–5.0)
HCT: 41.6 % (ref 36.0–46.0)
Hemoglobin: 13.2 g/dL (ref 12.0–15.0)
Lymphocytes Relative: 5.4 % — ABNORMAL LOW (ref 12.0–46.0)
Lymphs Abs: 0.7 K/uL (ref 0.7–4.0)
MCHC: 31.8 g/dL (ref 30.0–36.0)
MCV: 90.2 fl (ref 78.0–100.0)
Monocytes Absolute: 0.4 K/uL (ref 0.1–1.0)
Monocytes Relative: 2.9 % — ABNORMAL LOW (ref 3.0–12.0)
Neutro Abs: 12 K/uL — ABNORMAL HIGH (ref 1.4–7.7)
Neutrophils Relative %: 91.3 % — ABNORMAL HIGH (ref 43.0–77.0)
Platelets: 240 K/uL (ref 150.0–400.0)
RBC: 4.61 Mil/uL (ref 3.87–5.11)
RDW: 17.2 % — ABNORMAL HIGH (ref 11.5–15.5)
WBC: 13.1 K/uL — ABNORMAL HIGH (ref 4.0–10.5)

## 2024-08-14 LAB — PHOSPHORUS: Phosphorus: 2.6 mg/dL (ref 2.3–4.6)

## 2024-08-14 LAB — LIPASE: Lipase: 149 U/L — ABNORMAL HIGH (ref 11.0–59.0)

## 2024-08-14 NOTE — Telephone Encounter (Signed)
 FYI Only or Action Required?: Action required by provider: request for appointment and update on patient condition.  Patient is followed in Pulmonology for ILD, last seen on 07/27/2024 by Geronimo Amel, MD.  Called Nurse Triage reporting Shortness of Breath.  Symptoms began today.  Symptoms are: unchanged.  Triage Disposition: Call PCP Now  Patient/caregiver understands and will follow disposition?: Yes     Copied from CRM #[id number]. Topic: Clinical - Red Word Triage >> Aug 14, 2024 12:24 PM Rozanna MATSU wrote: Red Word that prompted transfer to Nurse Triage: low grade Fever, oxygen  dropping, dizzy, and stomach aches.       Reason for Disposition  Oxygen  level (e.g., pulse oximetry) 91 to 94%  Answer Assessment - Initial Assessment Questions Patient requesting an appointment but none are available until next week. Patient reports she will message Dr. Geronimo and has been advised to follow up with urgent care if needed. Patient instructed to call back for new or worsening symptoms. Patient verbalized understanding and agreement with this plan.     1. RESPIRATORY STATUS: Describe your breathing? (e.g., wheezing, shortness of breath, unable to speak, severe coughing)      Shortness of breath with exertion  2. ONSET: When did this breathing problem begin?      Today  3. PATTERN Does the difficult breathing come and go, or has it been constant since it started?      Intermittent with exertion  4. SEVERITY: How bad is your breathing? (e.g., mild, moderate, severe)      No shortness of breath at rest  5. RECURRENT SYMPTOM: Have you had difficulty breathing before? If Yes, ask: When was the last time? and What happened that time?      History of ILD 6. CARDIAC HISTORY: Do you have any history of heart disease? (e.g., heart attack, angina, bypass surgery, angioplasty)      Yes 7. LUNG HISTORY: Do you have any history of lung disease?  (e.g., pulmonary embolus,  asthma, emphysema)     Yes 8. CAUSE: What do you think is causing the breathing problem?      Unsure  9. OTHER SYMPTOMS: Do you have any other symptoms? (e.g., chest pain, cough, dizziness, fever, runny nose)     Intermittent dizziness, some abdominal discomfort, low grade fever  10. O2 SATURATION MONITOR:  Do you use an oxygen  saturation monitor (pulse oximeter) at home? If Yes, ask: What is your reading (oxygen  level) today? What is your usual oxygen  saturation reading? (e.g., 95%)       94% but drops below 90% with ambulation  Protocols used: Breathing Difficulty-A-AH

## 2024-08-14 NOTE — Telephone Encounter (Signed)
 I advised her to come NOW and do stat labs  - ordered If gets worse go to ER

## 2024-08-14 NOTE — Telephone Encounter (Signed)
 Pt has had labs completed.  Advised to pt if anything gets worse to go to ED

## 2024-08-15 ENCOUNTER — Emergency Department (HOSPITAL_COMMUNITY)

## 2024-08-15 ENCOUNTER — Telehealth: Payer: Self-pay | Admitting: Internal Medicine

## 2024-08-15 ENCOUNTER — Encounter (HOSPITAL_COMMUNITY): Payer: Self-pay

## 2024-08-15 ENCOUNTER — Emergency Department (HOSPITAL_COMMUNITY)
Admission: EM | Admit: 2024-08-15 | Discharge: 2024-08-15 | Disposition: A | Discharge status: Home / Self Care | Source: Ambulatory Visit | Attending: Emergency Medicine | Admitting: Emergency Medicine

## 2024-08-15 ENCOUNTER — Other Ambulatory Visit: Payer: Self-pay

## 2024-08-15 DIAGNOSIS — Z7901 Long term (current) use of anticoagulants: Secondary | ICD-10-CM | POA: Insufficient documentation

## 2024-08-15 DIAGNOSIS — R748 Abnormal levels of other serum enzymes: Secondary | ICD-10-CM | POA: Diagnosis not present

## 2024-08-15 DIAGNOSIS — R600 Localized edema: Secondary | ICD-10-CM | POA: Insufficient documentation

## 2024-08-15 DIAGNOSIS — D72819 Decreased white blood cell count, unspecified: Secondary | ICD-10-CM | POA: Diagnosis not present

## 2024-08-15 DIAGNOSIS — R110 Nausea: Secondary | ICD-10-CM | POA: Diagnosis not present

## 2024-08-15 DIAGNOSIS — R1013 Epigastric pain: Secondary | ICD-10-CM | POA: Insufficient documentation

## 2024-08-15 DIAGNOSIS — I2510 Atherosclerotic heart disease of native coronary artery without angina pectoris: Secondary | ICD-10-CM | POA: Insufficient documentation

## 2024-08-15 DIAGNOSIS — R109 Unspecified abdominal pain: Secondary | ICD-10-CM

## 2024-08-15 DIAGNOSIS — Z794 Long term (current) use of insulin: Secondary | ICD-10-CM | POA: Diagnosis not present

## 2024-08-15 DIAGNOSIS — I2699 Other pulmonary embolism without acute cor pulmonale: Secondary | ICD-10-CM | POA: Insufficient documentation

## 2024-08-15 LAB — COMPREHENSIVE METABOLIC PANEL WITH GFR
ALT: 26 U/L (ref 0–44)
AST: 24 U/L (ref 15–41)
Albumin: 3.5 g/dL (ref 3.5–5.0)
Alkaline Phosphatase: 47 U/L (ref 38–126)
Anion gap: 14 (ref 5–15)
BUN: 28 mg/dL — ABNORMAL HIGH (ref 8–23)
CO2: 20 mmol/L — ABNORMAL LOW (ref 22–32)
Calcium: 9.3 mg/dL (ref 8.9–10.3)
Chloride: 101 mmol/L (ref 98–111)
Creatinine, Ser: 1.05 mg/dL — ABNORMAL HIGH (ref 0.44–1.00)
GFR, Estimated: 55 mL/min — ABNORMAL LOW (ref >60)
Glucose, Bld: 155 mg/dL — ABNORMAL HIGH (ref 70–99)
Potassium: 4.4 mmol/L (ref 3.5–5.1)
Sodium: 135 mmol/L (ref 135–145)
Total Bilirubin: 0.3 mg/dL (ref 0.0–1.2)
Total Protein: 5.5 g/dL — ABNORMAL LOW (ref 6.5–8.1)

## 2024-08-15 LAB — CBC WITH DIFFERENTIAL/PLATELET
Abs Immature Granulocytes: 0.6 K/uL — ABNORMAL HIGH (ref 0.00–0.07)
Basophils Absolute: 0.1 K/uL (ref 0.0–0.1)
Basophils Relative: 1 %
Eosinophils Absolute: 0 K/uL (ref 0.0–0.5)
Eosinophils Relative: 0 %
HCT: 39.5 % (ref 36.0–46.0)
Hemoglobin: 12.5 g/dL (ref 12.0–15.0)
Immature Granulocytes: 5 %
Lymphocytes Relative: 6 %
Lymphs Abs: 0.8 K/uL (ref 0.7–4.0)
MCH: 29.7 pg (ref 26.0–34.0)
MCHC: 31.6 g/dL (ref 30.0–36.0)
MCV: 93.8 fL (ref 80.0–100.0)
Monocytes Absolute: 0.7 K/uL (ref 0.1–1.0)
Monocytes Relative: 5 %
Neutro Abs: 11 K/uL — ABNORMAL HIGH (ref 1.7–7.7)
Neutrophils Relative %: 83 %
Platelets: 232 K/uL (ref 150–400)
RBC: 4.21 MIL/uL (ref 3.87–5.11)
RDW: 16.2 % — ABNORMAL HIGH (ref 11.5–15.5)
WBC: 13.3 K/uL — ABNORMAL HIGH (ref 4.0–10.5)
nRBC: 0.4 % — ABNORMAL HIGH (ref 0.0–0.2)

## 2024-08-15 LAB — URINALYSIS, ROUTINE W REFLEX MICROSCOPIC
Bilirubin Urine: NEGATIVE
Glucose, UA: NEGATIVE mg/dL
Ketones, ur: NEGATIVE mg/dL
Leukocytes,Ua: NEGATIVE
Nitrite: NEGATIVE
Protein, ur: NEGATIVE mg/dL
Specific Gravity, Urine: 1.02 (ref 1.005–1.030)
pH: 5.5 (ref 5.0–8.0)

## 2024-08-15 LAB — URINALYSIS, MICROSCOPIC (REFLEX)
Bacteria, UA: NONE SEEN
Squamous Epithelial / HPF: NONE SEEN /HPF (ref 0–5)

## 2024-08-15 LAB — LACTIC ACID, PLASMA: LACTIC ACID: 2.3 mmol/L — ABNORMAL HIGH (ref 0.4–1.8)

## 2024-08-15 LAB — LIPASE, BLOOD: Lipase: 118 U/L — ABNORMAL HIGH (ref 11–51)

## 2024-08-15 MED ORDER — LACTATED RINGERS IV BOLUS
1000.0000 mL | Freq: Once | INTRAVENOUS | Status: AC
  Administered 2024-08-15: 1000 mL via INTRAVENOUS

## 2024-08-15 NOTE — Discharge Instructions (Signed)
 You were seen today for elevated lipase for concerns for developing pancreatitis accompanied with epigastric pain and nausea.  Your lab work today and imaging were reassuring and with you tolerating food and fluids believe you would best manage at home.  Continue to eat small meals.  Follow-up with PCP for lab redraw's and further monitoring of your symptoms.  If you intervene new or worsening symptoms, return to the ED for fever, persistent abdominal pain uncontrolled and worsening, persistent vomiting, blood in the stool.

## 2024-08-15 NOTE — ED Triage Notes (Signed)
 Pt reports being dx with pancreatitis yesterday and was told to come here but she tried to hydrate at home instead. Pt reports feeling bloated and distended.

## 2024-08-15 NOTE — ED Provider Notes (Signed)
 [place] EMERGENCY DEPARTMENT AT [facility] Provider Note   CSN: [id number] Arrival date & time: 08/15/24  1629     Patient presents with: Abdominal Pain   [name] A Shadle is a 73 y.o. female.   Abdominal Pain Associated symptoms: nausea   Patient is a 73 year old female presenting today for concerns for epigastric pain after noting to have an elevated lipase after labs were taken at pulmonology and told to come to the ED if she was not able to hydrate appropriately.  Pain reportedly worse after eating.  No send symptoms been present with nausea for the last 4 weeks intermittently.  With symptoms currently been present for the last 3 days. Notes that she was told to come off of CellCept  recently.   Medical history of HLD, pulmonary fibrosis, interstitial lung disease, chronic dyspnea on exertion, IBS, MVP, CAD, PE anticoagulated on Eliquis .  Reports no missed doses.  Told to come off of Lasix  yesterday secondary to elevated lipase of which she had noticed some mild lower extremity swelling that is minimized by compression stockings.  Had been able to tolerate plenty of fluids until today where she is notes that her stomach continually has felt full making it difficult to ingest fluids.   Endorses fever yesterday with temp of 100.4 F however noted that symptoms are had since abated today.  Has not taken Tylenol  or ibuprofen.  Also endorsing approximately 10 episodes of loose stool a day.  Denies headache, vision changes, chest pain, cough, congestion, vomiting, diarrhea, hematochezia, melena, dysuria, hematuria.   Prior to Admission medications   Medication Sig Start Date End Date Taking? Authorizing Provider  albuterol  (PROAIR  HFA) 108 (90 Base) MCG/ACT inhaler Inhale 2 puffs into the lungs every 4 (four) hours as needed for wheezing. Or coughing spells.  You may use 2 Puffs 5-10 minutes before exercise. 03/17/24   Amin, Ankit C, MD  albuterol  (PROVENTIL ) (2.5 MG/3ML)  0.083% nebulizer solution Take 3 mLs (2.5 mg total) by nebulization every 6 (six) hours as needed for wheezing or shortness of breath. 05/29/24 08/08/24  Laurence Locus, DO  Alpha-D-Galactosidase (BEANO PO) Take 1 tablet by mouth as needed (as directed, if eating onion or garlic).    [provider]  ALPRAZolam  (XANAX ) 0.25 MG tablet Take 0.25 mg by mouth at bedtime as needed for anxiety or sleep.    [provider]  AMBULATORY NON FORMULARY MEDICATION See admin instructions. Allergy injections on Mondays and hold when sick    [provider]  apixaban  (ELIQUIS ) 5 MG TABS tablet Take 1 tablet (5 mg total) by mouth 2 (two) times daily. 07/03/24 08/08/24  Charley Conger, PA-C  ascorbic acid (VITAMIN C) 500 MG tablet Take 500 mg by mouth daily.    [provider]  ASHWAGANDHA PO Take 1 capsule by mouth every other day.    [provider]  azelastine (ASTELIN) 0.1 % nasal spray Place 1 spray into both nostrils in the morning. Use in each nostril as directed    [provider]  Calcium  Carb-Cholecalciferol (CALCIUM  500 + D PO) Take 1 tablet by mouth daily.    [provider]  chlorpheniramine (CHLOR-TRIMETON) 4 MG tablet Take 2 mg by mouth 2 (two) times daily as needed for allergies or rhinitis.    [provider]  cholecalciferol (VITAMIN D3) 25 MCG (1000 UNIT) tablet Take 1,000 Units by mouth every other day.    [provider]  Continuous Glucose Sensor (DEXCOM G7 SENSOR) MISC  Check blood sugar fasting and before meals and at bedtime. 04/23/24     cycloSPORINE (RESTASIS) 0.05 % ophthalmic emulsion Place 1 drop into both eyes 2 (two) times daily.    [provider]  diltiazem  (CARDIZEM  CD) 120 MG 24 hr capsule Take 1 capsule (120 mg total) by mouth daily. 07/21/24   Lavona Agent, MD  doxycycline  (VIBRAMYCIN ) 100 MG capsule Take 100 mg by mouth every 12 (twelve) hours. 07/25/24   [provider]  Dupilumab  (DUPIXENT ) 300  MG/2ML SOAJ Inject 300 mg into the skin every 14 (fourteen) days. 07/17/24   Geronimo Amel, MD  EPINEPHrine  0.3 mg/0.3 mL IJ SOAJ injection Inject 0.3 mg into the muscle as needed for anaphylaxis.    [provider]  ezetimibe  (ZETIA ) 10 MG tablet Take 1 tablet (10 mg total) by mouth daily. 02/10/24   Lavona Agent, MD  fluticasone  (FLONASE ) 50 MCG/ACT nasal spray Spray 2 sprays into each nostril every day. 03/06/24   Geronimo Amel, MD  Furosemide  (LASIX  PO) Take 1 tablet by mouth daily.    [provider]  Glucagon  (BAQSIMI  ONE PACK) 3 MG/DOSE POWD Place 1 Device into the nose as needed (Low blood sugar with impaired consciousness). 06/29/24   Motwani, Komal, MD  HUMALOG  KWIKPEN 100 UNIT/ML KwikPen Inject 8-10 Units into the skin See admin instructions. Inject 10 units into the skin before breakfast & lunch and 8 before supper 01/27/24   [provider]  Hypromellose (NATURES TEARS OP) Place 1 drop into both eyes in the morning and at bedtime.    [provider]  LANTUS  SOLOSTAR 100 UNIT/ML Solostar Pen Inject 100 Units into the skin daily. 06/12/24   [provider]  levocetirizine (XYZAL ) 5 MG tablet Take 5 mg by mouth at bedtime. 09/26/20   [provider]  metFORMIN  (GLUCOPHAGE -XR) 500 MG 24 hr tablet Take 500 mg by mouth daily with breakfast. 07/08/23   [provider]  metoprolol  succinate (TOPROL -XL) 25 MG 24 hr tablet Take 1 tablet (25 mg total) by mouth daily. 07/21/24   Lavona Agent, MD  montelukast  (SINGULAIR ) 10 MG tablet TAKE 1 TABLET BY MOUTH EVERYDAY AT BEDTIME 01/29/20   Geronimo Amel, MD  Multiple Vitamin (MULTIVITAMIN) tablet Take 1 tablet by mouth daily with breakfast.    [provider]  mycophenolate  (CELLCEPT ) 500 MG tablet Take 2 tablets (1,000 mg total) by mouth 2 (two) times daily. 05/29/24 08/08/24  Laurence Locus, DO  nitroGLYCERIN  (NITROGLYN) 2 % ointment Apply 0.25 inches topically 2 (two) times daily.  Apply To anus and surrounding tissue. Use pea size amount 05/29/24   Laurence Locus, DO  nitroGLYCERIN  (NITROSTAT ) 0.4 MG SL tablet Place 1 tablet (0.4 mg total) under the tongue every 5 (five) minutes as needed for chest pain. 07/21/24   Lavona Agent, MD  nystatin  cream (MYCOSTATIN ) Apply 1 Application topically 2 (two) times daily as needed (for irritation- affected sites).    [provider]  omeprazole  (PRILOSEC ) 20 MG capsule Take 1 capsule (20 mg total) by mouth 2 (two) times daily before a meal. 08/08/24   Pyrtle, Gordy HERO, MD  OXYGEN  Inhale 4-5 L/min into the lungs See admin instructions. Inhale 4-5 L/min at bedtime and as needed for shortness of breath during the day    [provider]  potassium chloride  (KLOR-CON  10) 10 MEQ tablet Take 1 tablet (10 mEq total) by mouth daily. 06/14/24   Geronimo Amel, MD  pravastatin  (PRAVACHOL ) 40 MG tablet Take 1 tablet (40  mg total) by mouth every evening. 06/12/20   Lavona Agent, MD  predniSONE  (DELTASONE ) 10 MG tablet 50 mg daily  x 2 weeks and then 40 mg/day and stay there till further instructions Patient taking differently: 30 mg. 50 mg daily  x 2 weeks and then 40 mg/day and stay there till further instructions 06/14/24   Geronimo Amel, MD  PREPARATION H 0.25-50 % GEL Place 1 application  rectally 2 (two) times daily as needed (for hemorrhoids).    [provider]  revefenacin  (YUPELRI ) 175 MCG/3ML nebulizer solution Inhale 1 vial by nebulization daily. 07/03/24 12/30/24  Charley Conger, PA-C  riTUXimab -pvvr (RUXIENCE  IV) Inject into the vein. Infuse 1000mg  at Day 0 and Day 14. Repeat cycle every 6 months    Geronimo Amel, MD  sodium chloride  (OCEAN) 0.65 % SOLN nasal spray Place 1 spray into both nostrils as needed for congestion.    [provider]  Sodium Chloride , Inhalant, 7 % NEBU Use per nebulizer once a day to help clear sputum 07/08/21   Geronimo Amel, MD  sulfamethoxazole -trimethoprim  (BACTRIM  DS)  800-160 MG tablet Take 1 tablet by mouth every Monday, Wednesday, and Friday.    [provider]    Allergies: Ozempic (0.25 or 0.5 mg-dose) [semaglutide(0.25 or 0.5mg -dos)], Tape, Betadine [povidone iodine], Codeine, Garlic, Hydrocodone , Iodine, Macrobid [nitrofurantoin], Ofev  [nintedanib], Onion, Shellfish allergy, Statins, and Sulfa  antibiotics    Review of Systems  Gastrointestinal:  Positive for abdominal pain and nausea.  All other systems reviewed and are negative.   Updated Vital Signs BP (!) 152/77 (BP Location: Left Arm)   Pulse 70   Temp 97.6 F (36.4 C) (Oral)   Resp 16   SpO2 95%   Physical Exam Vitals and nursing note reviewed.  Constitutional:      General: She is not in acute distress.    Appearance: Normal appearance. She is not ill-appearing or diaphoretic.  HENT:     Head: Normocephalic and atraumatic.  Eyes:     General: No scleral icterus.       Right eye: No discharge.        Left eye: No discharge.     Extraocular Movements: Extraocular movements intact.     Conjunctiva/sclera: Conjunctivae normal.  Cardiovascular:     Rate and Rhythm: Normal rate and regular rhythm.     Pulses: Normal pulses.     Heart sounds: Normal heart sounds. No murmur heard.    No friction rub. No gallop.  Pulmonary:     Effort: Pulmonary effort is normal. No respiratory distress.     Breath sounds: No stridor. No wheezing, rhonchi or rales.  Chest:     Chest wall: No tenderness.  Abdominal:     General: Abdomen is flat. There is no distension.     Palpations: Abdomen is soft.     Tenderness: There is abdominal tenderness (Notably has mild epigastric abdominal tenderness to palpation) in the epigastric area. There is no right CVA tenderness, left CVA tenderness, guarding or rebound. Negative signs include Murphy's sign and McBurney's sign.  Musculoskeletal:        General: No swelling, deformity or signs of injury.     Cervical back: Normal range of motion. No  rigidity.     Right lower leg: Edema present.     Left lower leg: Edema present.     Comments: Mild lower leg edema noted bilaterally  Skin:    General: Skin is warm and dry.     Findings:  No bruising, erythema or lesion.  Neurological:     General: No focal deficit present.     Mental Status: She is alert and oriented to person, place, and time. Mental status is at baseline.     Sensory: No sensory deficit.     Motor: No weakness.  Psychiatric:        Mood and Affect: Mood normal.     (all labs ordered are listed, but only abnormal results are displayed) Labs Reviewed  COMPREHENSIVE METABOLIC PANEL WITH GFR - Abnormal; Notable for the following components:      Result Value   CO2 20 (*)    Glucose, Bld 155 (*)    BUN 28 (*)    Creatinine, Ser 1.05 (*)    Total Protein 5.5 (*)    GFR, Estimated 55 (*)    All other components within normal limits  LIPASE, BLOOD - Abnormal; Notable for the following components:   Lipase 118 (*)    All other components within normal limits  CBC WITH DIFFERENTIAL/PLATELET - Abnormal; Notable for the following components:   WBC 13.3 (*)    RDW 16.2 (*)    nRBC 0.4 (*)    Neutro Abs 11.0 (*)    Abs Immature Granulocytes 0.60 (*)    All other components within normal limits  URINALYSIS, ROUTINE W REFLEX MICROSCOPIC - Abnormal; Notable for the following components:   APPearance CLOUDY (*)    Hgb urine dipstick SMALL (*)    All other components within normal limits  URINALYSIS, MICROSCOPIC (REFLEX)    EKG: None  Radiology: US  Abdomen Limited RUQ (LIVER/GB) Result Date: 08/15/2024 EXAM: Right Upper Quadrant Abdominal Ultrasound 08/15/2024 08:34:10 PM TECHNIQUE: Real-time ultrasonography of the right upper quadrant of the abdomen was performed. COMPARISON: CT abdomen pelvis dated 05/24/2024. CLINICAL HISTORY: Epigastric pain. FINDINGS: LIVER: Hyperechoic hepatic parenchyma, suggesting hepatic steatosis. No intrahepatic biliary ductal  dilatation. No evidence of mass. BILIARY SYSTEM: No pericholecystic fluid or wall thickening. No cholelithiasis. Negative sonographic Murphy's sign. Common bile duct measures 3 mm. OTHER: No right upper quadrant ascites. IMPRESSION: 1. No acute findings. 2. Hepatic steatosis. Electronically signed by: Pinkie Pebbles MD 08/15/2024 08:38 PM EDT RP Workstation: [id number]    Procedures   Medications Ordered in the ED  lactated ringers  bolus 1,000 mL (0 mLs Intravenous Stopped 08/15/24 2203)     Medical Decision Making Amount and/or Complexity of Data Reviewed Labs: ordered. Radiology: ordered.  This patient is a 73 year old female who presents to the ED for concern of elevated lipase, concern for developing pancreatitis.  Has had nausea and mild epigastric pain particularly worse after eating.  Described as pressure.  Reported that she has had full body CT scans recently within the last couple months.  On physical exam, patient is in no acute distress, afebrile, alert and orient x 4, speaking in full sentences, nontachypneic, nontachycardic.  Notably has mild epigastric abdominal tenderness to palpation with negative Murphy sign.  Unremarkable exam otherwise.  With patient's labs showing mildly elevated lipase, white count, obtained ultrasound right upper quadrant which did not show any acute findings.  Patient also notably has improved creatinine function from previous.  Provided a liter of fluid and reevaluation, patient is feeling significantly better.  Noting that she had not had had much to drink today.  With her tolerating p.o.  food and fluids without recurrence of pain and having no vomiting, believe that she is safe to discharge at this time with follow-up with PCP, continue to  hydrate orally at home.  Patient was also seen by attending who agreed with plan.  Patient vital signs have remained stable throughout the course of patient's time in the ED. Low suspicion for any other emergent  pathology at this time. I believe this patient is safe to be discharged. Provided strict return to ER precautions. Patient expressed agreement and understanding of plan. All questions were answered.  Differential diagnoses prior to evaluation: The emergent differential diagnosis includes, but is not limited to, pancreatitis, Cholecystis, hepatitis, PUD, GERD,  Mesenteric ischemia, diverticulitis, nephrolithiasis, constipation, bowel obstruction, IBD  . This is not an exhaustive differential.   Past Medical History / Co-morbidities / Social History: HLD, pulmonary fibrosis, interstitial lung disease, chronic dyspnea on exertion, IBS, MVP, CAD, PE anticoagulated on Eliquis   Additional history: Chart reviewed. Pertinent results include:   Had labs done yesterday by pulmonology told that she has an elevated lipase and to continue to hydrate and return to the ED with right upper quadrant ultrasound ordered.  Noted to have an EF of 65 to 70% on 05/12/2024 with normal LV function.   Lab Tests/Imaging studies: I personally interpreted labs/imaging and the pertinent results include:    CBC notes a mildly low white count of 15.3 CMP shows a creatinine 1.05 increased BUN 28 and decreased GFR 55 Lipase 118. UA unremarkable Right upper quadrant ultrasound shows hepatic steatosis without any acute abnormalities. I agree with the radiologist interpretation.  Medications: I ordered medication including LR.  I have reviewed the patients home medicines and have made adjustments as needed.  Critical Interventions: None  Social Determinants of Health: Has good follow-up with PCP  Disposition: After consideration of the diagnostic results and the patients response to treatment, I feel that the patient would benefit from discharge and treatment as above.   emergency department workup does not suggest an emergent condition requiring admission or immediate intervention beyond what has been performed at  this time. The plan is: Hydrate at home, return for new or worsening symptoms, follow-up PCP. The patient is safe for discharge and has been instructed to return immediately for worsening symptoms, change in symptoms or any other concerns.  Final diagnoses:  Elevated lipase  Epigastric pain  Nausea    ED Discharge Orders     None          Beola Terrall GORMAN DEVONNA 08/15/24 2308    Mannie Pac T, DO 08/16/24 2240

## 2024-08-15 NOTE — Telephone Encounter (Signed)
 Rhonda [name]  -> called in yesterday with abdominal pain and fever. Better today. Labs yesterday showed slight bump in creatinine but also elevated lipase. She is better today after I tol her to hydrate and stop cellcept    I am getting RUQ US  I Can repeat labs later this week or next week  Anything else?  Thanks  MR

## 2024-08-16 ENCOUNTER — Ambulatory Visit: Admitting: Skilled Nursing Facility1

## 2024-08-16 DIAGNOSIS — E1165 Type 2 diabetes mellitus with hyperglycemia: Secondary | ICD-10-CM | POA: Diagnosis not present

## 2024-08-16 DIAGNOSIS — E119 Type 2 diabetes mellitus without complications: Secondary | ICD-10-CM

## 2024-08-16 NOTE — Progress Notes (Signed)
 Appointment start:  8:45 Appointment End:  9:15  Patient attended Diabetes Core Classes 1-3 in May at [facility]. The purpose of the meeting today is to review information learned during those classes as well as review patient application and goals.  Connected with pt via telephone: pt in home identified by name and DOB; pt agreeable to visit type limitations   Pt states she was recently admitted due to pancreatitis from her medication. Pt states she does have a fever currently with directions to go to the doctor if it is not controlled.   Pt states her insulin  medication she had just figured it out but feels she has fallen back with that due to this recent hospitalization.   Pt states  Allergic to onion and garlic  What are one or two positive things that you are doing right now to manage your diabetes?  She was able to take her insulin  as prescribed   What is the hardest part about your diabetes right now, causing you the most concern, or is the most worrisome to you about your diabetes?  Recent pancreatitis flare and insulin  management   What questions do you have today?  Sick day management of insulin   Have you participated in any diabetes support group?  no  History:   A1C:  7.2 Medications include: lantus  26 units, breakfast 10 units units, lunch 15-16 units, dinner 6-8 units; taking metformin  at lunch instead of breakfast  Sleep:   Weight:  telephone appt Blood Glucose:  Fasting:  111  2 hours after starting a meal:  150's  Have you had any low blood sugar readings in the past month?  no  Social History:   Exercise:  ADLs due to recent hospitalization   24 hour diet recall: Breakfast:   Snack:   Lunch:   Snack:   Dinner:   Snack:   Beverages:     Specific focus but not limited to the following: Review of blood glucose monitoring and interpretation including the recommended target ranges and Hgb A1c.  Review of carbohydrate counting,  importance of regularly scheduled meals/snacks, and meal planning to improve quality of diet. Review of the effects of physical activity on glucose levels and long-term glucose control.  Recommended goal of 150 minutes of physical activity/week. Review of patient medications and discussed role of medication on blood glucose and possible side effects. Discussion of strategies to manage stress, psychosocial issues, and other obstacles to diabetes management. Review of short-term complications: hyper- and hypo-glycemia (causes, symptoms, and treatment options) Review of prevention, detection, and treatment of long-term complications.  Discussion of the role of prolonged elevated glucose levels on body systems.  Continuing Goals: Insulin  management with illness   Future Follow up:  October 8th

## 2024-08-17 ENCOUNTER — Encounter: Payer: Self-pay | Admitting: Internal Medicine

## 2024-08-18 ENCOUNTER — Other Ambulatory Visit: Payer: Self-pay

## 2024-08-18 ENCOUNTER — Other Ambulatory Visit (HOSPITAL_COMMUNITY): Payer: Self-pay

## 2024-08-18 NOTE — Telephone Encounter (Signed)
**Note De-identified  [place] Obfuscation** Please advise 

## 2024-08-18 NOTE — Telephone Encounter (Signed)
 Gordy the following day (2d ag) she ended up in ER for diagnosis of pancreatitis. sEnt home but yestrday she said she still had fevers and abdominal pain. RUQ US  is ok. Passing on in case you want to time your followup  Reply not needed  Thanks    SIGNATURE    Dr. Dorethia Cave, M.D., F.C.C.P,  Pulmonary and Critical Care Medicine Staff Physician, [facility] Director - Interstitial Lung Disease  Program  Pulmonary Fibrosis [facility] at [facility] [place], [place], [address]   Pager: [phone], If no answer  -> Check AMION or Try [phone] Telephone (clinical office): [phone] Telephone (research): [phone]  11:24 AM 08/18/2024

## 2024-08-18 NOTE — Progress Notes (Signed)
 Specialty Pharmacy Refill Coordination Note  Spoke with Rhonda [name] is a 73 y.o. female contacted today regarding refills of specialty medication(s) Dupilumab  (Dupixent )  Doses on hand: 0  Injection date: 08/29/24   Patient requested: Delivery   Delivery date: 08/24/24   Verified address: [address] [place] [address]  Medication will be filled on 08/23/24.

## 2024-08-22 ENCOUNTER — Encounter (HOSPITAL_COMMUNITY): Payer: Self-pay

## 2024-08-22 ENCOUNTER — Ambulatory Visit (HOSPITAL_COMMUNITY)
Admission: RE | Admit: 2024-08-22 | Discharge: 2024-08-22 | Disposition: A | Discharge status: Home / Self Care | Source: Ambulatory Visit | Attending: Internal Medicine | Admitting: Internal Medicine

## 2024-08-22 ENCOUNTER — Ambulatory Visit: Payer: Self-pay | Admitting: Internal Medicine

## 2024-08-22 DIAGNOSIS — R109 Unspecified abdominal pain: Secondary | ICD-10-CM

## 2024-08-22 DIAGNOSIS — R748 Abnormal levels of other serum enzymes: Secondary | ICD-10-CM

## 2024-08-22 NOTE — Telephone Encounter (Signed)
 Waddell from Ultrasound [facility]  Right upper quadrant US  of liver and gallbladder Ordered on the same day she had this test performed September 23rd  Called [facility] and they advised that staff wasn't available at the moment, send a CRM, and call back in 10-15 minutes if needed. Ultrasound advised that this was okay and they just wanted to clarify since she already had this exam done. Pulmonary can reach [facility] at [phone]  FYI Only or Action Required?: Action required by provider: ultrasound order question.  Patient is followed in Pulmonology for Acute bronchitis, last seen on 07/27/2024 by Geronimo Amel, MD.  [facility] Nurse Triage reporting Radiology Question.   Triage Disposition: Call PCP Within 24 Hours  Patient/caregiver understands and will follow disposition?: Yes           Copied from CRM #[id number]. Topic: Clinical - Lab/Test Results >> Aug 22, 2024  7:51 AM Nathanel DEL wrote: Reason for CRM: taylor w/ [facility] UDS dept calling for nurse w/ Dr Geronimo Reason for Disposition  [1] Follow-up call from patient regarding patient's clinical status AND [2] information NON-URGENT  Answer Assessment - Initial Assessment Questions Waddell from Ultrasound [place] [facility]  Right upper quadrant US  of liver and gallbladder Ordered on the same day she had this test performed September 23rd  Called [facility] and they advised that staff wasn't available at the moment, send a CRM, and call back in 10-15 minutes if needed. Ultrasound advised that this was okay and they just wanted to clarify since she already had this exam done. Pulmonary can reach [facility] at [phone]  Answer Assessment - Initial Assessment Questions Waddell from Ultrasound [place] [facility]  Right upper quadrant US  of liver and gallbladder Ordered on the same day she had this test performed September 23rd  Called [facility] and they advised that staff wasn't available at the moment, send a CRM, and call back in 10-15 minutes if  needed. Ultrasound advised that this was okay and they just wanted to clarify since she already had this exam done. Pulmonary can reach [facility] at [phone] at Ultrasound at [facility]  Protocols used: Information Only Call - No Triage-A-AH, PCP Call - No Triage-A-AH

## 2024-08-22 NOTE — Telephone Encounter (Signed)
 Called [facility] back to inquire about this order clarification. Staff was unavailable. Called Rhonda [name] advised that the patient spoke with her doctor and the exam wasn't needed so it was cancelled and patient was okay with that.

## 2024-08-25 NOTE — Telephone Encounter (Signed)
 Spoke to patient and advised to cancel. Advised to follow with DrPyrtle. Also d/w Pyrtle over weekend when I was rounding in ICU

## 2024-08-28 ENCOUNTER — Encounter (HOSPITAL_BASED_OUTPATIENT_CLINIC_OR_DEPARTMENT_OTHER): Payer: Self-pay

## 2024-08-28 ENCOUNTER — Ambulatory Visit (HOSPITAL_BASED_OUTPATIENT_CLINIC_OR_DEPARTMENT_OTHER)

## 2024-08-28 VITALS — BP 129/66 | HR 85 | Ht 59.0 in | Wt 149.0 lb

## 2024-08-28 DIAGNOSIS — K625 Hemorrhage of anus and rectum: Secondary | ICD-10-CM | POA: Diagnosis not present

## 2024-08-28 DIAGNOSIS — J209 Acute bronchitis, unspecified: Secondary | ICD-10-CM | POA: Diagnosis not present

## 2024-08-28 DIAGNOSIS — R739 Hyperglycemia, unspecified: Secondary | ICD-10-CM | POA: Diagnosis not present

## 2024-08-28 DIAGNOSIS — J849 Interstitial pulmonary disease, unspecified: Secondary | ICD-10-CM

## 2024-08-28 DIAGNOSIS — J9611 Chronic respiratory failure with hypoxia: Secondary | ICD-10-CM

## 2024-08-28 DIAGNOSIS — Z9981 Dependence on supplemental oxygen: Secondary | ICD-10-CM

## 2024-08-28 DIAGNOSIS — Z7952 Long term (current) use of systemic steroids: Secondary | ICD-10-CM

## 2024-08-28 DIAGNOSIS — R609 Edema, unspecified: Secondary | ICD-10-CM

## 2024-08-28 DIAGNOSIS — J45909 Unspecified asthma, uncomplicated: Secondary | ICD-10-CM

## 2024-08-28 DIAGNOSIS — Z86711 Personal history of pulmonary embolism: Secondary | ICD-10-CM

## 2024-08-28 MED ORDER — REVEFENACIN 175 MCG/3ML IN SOLN
175.0000 ug | Freq: Every day | RESPIRATORY_TRACT | 5 refills | Status: AC
  Filled 2024-08-30: qty 90, 30d supply, fill #0

## 2024-08-28 MED ORDER — SULFAMETHOXAZOLE-TRIMETHOPRIM 800-160 MG PO TABS: 1.0000 | ORAL_TABLET | ORAL | 2 refills | Status: AC

## 2024-08-28 MED ORDER — ALBUTEROL SULFATE HFA 108 (90 BASE) MCG/ACT IN AERS: 2.0000 | INHALATION_SPRAY | Freq: Four times a day (QID) | RESPIRATORY_TRACT | 6 refills | Status: AC | PRN

## 2024-08-28 MED ORDER — ALBUTEROL SULFATE HFA 108 (90 BASE) MCG/ACT IN AERS: 2.0000 | INHALATION_SPRAY | RESPIRATORY_TRACT | 3 refills | Status: DC | PRN

## 2024-08-28 MED ORDER — APIXABAN 5 MG PO TABS: 5.0000 mg | ORAL_TABLET | Freq: Two times a day (BID) | ORAL | 5 refills | Status: DC

## 2024-08-28 MED ORDER — OMEPRAZOLE 20 MG PO CPDR: 20.0000 mg | DELAYED_RELEASE_CAPSULE | Freq: Every day | ORAL | 11 refills | Status: DC

## 2024-08-28 NOTE — Assessment & Plan Note (Signed)
-    Continue supportive oxygen

## 2024-08-28 NOTE — Assessment & Plan Note (Signed)
-   CellCept  on hold due to pancreatitis.  Patient is reluctant to resume this; continue to hold presently.  Revisit at follow up. -Continue Rituxan ; next dose in January 2026. - Doing fairly well on alternating doses of Prednisone  20mg /30mg ; the dose reduction may be the cause of fluctuating oxygen  needs; continue to monitor as we continue to wean -  Continue PJP prophylaxis for now; may be able to stop if continues off Cellcept

## 2024-08-28 NOTE — Patient Instructions (Signed)
 Continue Eliquis  and Bactrim ; refills sent in.  Continue Prednisone  taper as per previous.  Resume Yupelri ; refills sent today.  Humidity added to oxygen  concentrator; DME referral sent.  Start Pepcid ; rx sent today.  Contact [facility] regarding transplant.  Follow up as scheduled with Dr. Geronimo on 09/14/2024

## 2024-08-28 NOTE — Assessment & Plan Note (Signed)
 Resolved

## 2024-08-28 NOTE — Progress Notes (Signed)
 @Patient  ID: Rhonda [name], female    DOB: 1951-05-26, 73 y.o.   MRN: [id number]  Chief Complaint  Patient presents with   Follow-up    Referring provider: Burney Darice CROME, MD  HPI: Rhonda [name] is a 73 year old female with past medical history of interstitial lung disease, chronic respiratory failure with hypoxia on home oxygen , and pulmonary embolisms on Eliquis  therapy who presents today for follow-up.  Her course has been complicated by rectal bleeding associated with hemorrhoids while on Eliquis  therapy.  She is followed by GI and has subsequently had banding x 2 for hemorrhoids.  She reports that she continues on Eliquis  with no further bleeding.  She also has 1 more procedures to go for hemorrhoidal banding.  She was last seen in clinic by Dr. Geronimo on 07/27/2024.  At that time she was on doxycycline  per her PCP for possible acute bronchitis.  It was unclear at that time if it was due to bacteria versus virus versus prednisone  reduction.  She completed her doxycycline  course and states that she still has some sputum production but it is clear throughout the day.  She also reports that she is out of Yupelri  and has not been taking that.  She had previously been on Pepcid  but is also out of this and is requesting a refill today.  She reports that she is continue to taper her prednisone  and is currently alternating 30 mg with 20 mg daily.  She has an endocrine consult pending at the end of this month for further evaluation and treatment of her diabetes.  Since her last visit she had an episode of pancreatitis which was thought to be secondary to CellCept .  This was stopped on 08/14/2024.  She reports that since the CellCept  was stopped she has had a decrease in her stools per day.  She was previously having up to 10 stools per day and is now down to 5 or 6 which is improved with using a half a tablet of Imodium daily.  She continues to take Bactrim  3 times a week for PJP prophylaxis.   She reports that at home she has needed to use oxygen  anywhere between 2 and 3 L.  She feels that she has had to increase it up to 3 L more often.  She denies fever, chills, night sweats, headache.  She does admit dizziness at times.  She has also had some swelling in her lower extremities but has not tried Lasix  for this. She also complains of a dry nose and dry mouth associated with her oxygen .  She also continues to take Dupixent .  TEST/EVENTS :  08/14/2024: CellCept  stopped due to pancreatitis. 07/17/2024: HPI: Rhonda [name] is a 73 year old female with past medical history of interstitial lung disease, chronic respiratory failure with hypoxia on home oxygen , and recent diagnosis of pulmonary embolisms on Eliquis  therapy who presents today for follow-up.  She was diagnosed with bilateral pulmonary embolisms a little over a month ago and started on oral Eliquis .  She has struggled with rectal bleeding on and off since then and follows closely with GI.  She has a hemorrhoidal banding procedure scheduled for this Thursday.  Her main struggle currently is with fluctuating blood sugars and fluctuating peripheral leg swelling.  She is currently on prednisone  40 mg daily in addition to CellCept .  She reports no worsening of her respiratory baseline at present.  She denies fever, chills, productive cough, night sweats, weight loss, chest pain.  She feels  that her rectal bleeding has almost completely stopped and she remains on Eliquis  daily.  She started Dupixent  injections today with our Pharmacy team. Allergies  Allergen Reactions   Ozempic (0.25 Or 0.5 Mg-Dose) [Semaglutide(0.25 Or 0.5mg -Dos)] Other (See Comments)    Paralyzed the stomach   Tape Other (See Comments)    SKIN IS VERY FRAGILE!! Burns and pulls up the skin!! SKIN BRUISES and TEARS EASILY.SABRA   Betadine [Povidone Iodine] Other (See Comments)    Blisters    Codeine Nausea And Vomiting   Garlic Diarrhea   Hydrocodone  Nausea And  Vomiting   Iodine Other (See Comments)    Blisters    Macrobid [Nitrofurantoin] Other (See Comments)    Instructed by dr not to take    Ofev  [Nintedanib] Other (See Comments)    Abdominal pain   Onion Diarrhea   Shellfish Allergy Nausea And Vomiting   Statins Other (See Comments)    Leg pain, but Pravachol  is tolerated   Sulfa  Antibiotics Other (See Comments)    Headaches     Immunization History  Administered Date(s) Administered   Fluad Quad(high Dose 65+) 07/17/2019   Hepatitis A 05/23/2010   Hepatitis B 06/23/2006   INFLUENZA, HIGH DOSE SEASONAL PF 08/24/2016, 07/24/2017, 08/31/2018, 08/15/2020, 09/26/2020, 09/12/2021, 08/24/2022   Influenza Split 08/11/2012, 08/14/2017   Influenza Whole 09/08/2011   Influenza,inj,Quad PF,6+ Mos 09/07/2013   Influenza-Unspecified 09/26/2014, 07/27/2015, 08/24/2023   MMR 05/23/2010   Moderna Sars-Covid-2 Vaccination 05/07/2021, 07/30/2021, 01/01/2022   PFIZER(Purple Top)SARS-COV-2 Vaccination 12/16/2019, 01/08/2020, 07/18/2020, 08/11/2022   Pfizer(Comirnaty)Fall Seasonal Vaccine 12 years and older 07/24/2023   Pneumococcal Conjugate-13 03/15/2014   Pneumococcal Polysaccharide-23 01/11/2018, 09/26/2020, 08/26/2021   Respiratory Syncytial Virus Vaccine,Recomb Aduvanted(Arexvy) 07/28/2022   Td 02/22/2003   Tdap 10/28/2012   Zoster Recombinant(Shingrix) 02/15/2017, 05/17/2017   Zoster, Live 12/04/2010    Past Medical History:  Diagnosis Date   Allergy    Arthritis    history spinal stenosis. osteoarthritis right hip   Asthma    Cataract    Coronary artery calcification seen on CAT scan 08/19/2017   >300 on CT scan 08/2017   DDD (degenerative disc disease), thoracic    Depression    DOE (dyspnea on exertion)    a. 04/2010 Lexi MV EF 71%, no ischemia/infarct;     Fatty liver    GERD (gastroesophageal reflux disease)    H/O steroid therapy    Steroid use orally over 4 yrs- for Lung Fibrosis   Helicobacter pylori ab+     Hemorrhoids    Hiatal hernia    High cholesterol    History of chronic bronchitis    as child   History of migraine    History of MRSA infection    Hyperlipidemia, mixed 08/19/2017   Hyperplastic colon polyp 2007   IBS (irritable bowel syndrome)    Inguinal hernia    right   Insulin  resistance    past   Interstitial lung disease (HCC)    MVP (mitral valve prolapse)    Posterior mitral valve leaflet with mild MR   Pneumonia    Pneumonitis, hypersensitivity (HCC)    a. 09/2012 s/p Bx - ? 2/2 bird, mold, oil paint exposure ->on steroids, followed by pulm.   PONV (postoperative nausea and vomiting)    Pre-diabetes    takes Metformin    Pulmonary embolism (HCC)    Pulmonary fibrosis (HCC)    Dr. Geronimo follows- stable at present   PVC (premature ventricular contraction) 08/19/2017   Rapid heart  rate    Dr. Shlomo follows- last visit Epic note 9'16   Squamous cell carcinoma of skin    Tinnitus, right ear    Vocal cord ulcer     Tobacco History: Social History   Tobacco Use  Smoking Status Never  Smokeless Tobacco Never  Tobacco Comments   pt states she experimented in college   Counseling given: Not Answered Tobacco comments: pt states she experimented in college   Outpatient Medications Prior to Visit  Medication Sig Dispense Refill   albuterol  (PROVENTIL ) (2.5 MG/3ML) 0.083% nebulizer solution Take 3 mLs (2.5 mg total) by nebulization every 6 (six) hours as needed for wheezing or shortness of breath. 360 mL 0   Alpha-D-Galactosidase (BEANO PO) Take 1 tablet by mouth as needed (as directed, if eating onion or garlic).     ALPRAZolam  (XANAX ) 0.25 MG tablet Take 0.25 mg by mouth at bedtime as needed for anxiety or sleep.     AMBULATORY NON FORMULARY MEDICATION See admin instructions. Allergy injections on Mondays and hold when sick     ascorbic acid (VITAMIN C) 500 MG tablet Take 500 mg by mouth daily.     ASHWAGANDHA PO Take 1 capsule by mouth every other day.      azelastine (ASTELIN) 0.1 % nasal spray Place 1 spray into both nostrils in the morning. Use in each nostril as directed     Calcium  Carb-Cholecalciferol (CALCIUM  500 + D PO) Take 1 tablet by mouth daily.     chlorpheniramine (CHLOR-TRIMETON) 4 MG tablet Take 2 mg by mouth 2 (two) times daily as needed for allergies or rhinitis.     cholecalciferol (VITAMIN D3) 25 MCG (1000 UNIT) tablet Take 1,000 Units by mouth every other day.     Continuous Glucose Sensor (DEXCOM G7 SENSOR) MISC Check blood sugar fasting and before meals and at bedtime. 4 each 0   cycloSPORINE (RESTASIS) 0.05 % ophthalmic emulsion Place 1 drop into both eyes 2 (two) times daily.     diltiazem  (CARDIZEM  CD) 120 MG 24 hr capsule Take 1 capsule (120 mg total) by mouth daily. 90 capsule 3   doxycycline  (VIBRAMYCIN ) 100 MG capsule Take 100 mg by mouth every 12 (twelve) hours.     Dupilumab  (DUPIXENT ) 300 MG/2ML SOAJ Inject 300 mg into the skin every 14 (fourteen) days. 4 mL 2   EPINEPHrine  0.3 mg/0.3 mL IJ SOAJ injection Inject 0.3 mg into the muscle as needed for anaphylaxis.     ezetimibe  (ZETIA ) 10 MG tablet Take 1 tablet (10 mg total) by mouth daily. 90 tablet 3   fluticasone  (FLONASE ) 50 MCG/ACT nasal spray Spray 2 sprays into each nostril every day. 48 g 12   Furosemide  (LASIX  PO) Take 1 tablet by mouth daily.     Glucagon  (BAQSIMI  ONE PACK) 3 MG/DOSE POWD Place 1 Device into the nose as needed (Low blood sugar with impaired consciousness). 2 each 3   HUMALOG  KWIKPEN 100 UNIT/ML KwikPen Inject 8-10 Units into the skin See admin instructions. Inject 10 units into the skin before breakfast & lunch and 8 before supper     Hypromellose (NATURES TEARS OP) Place 1 drop into both eyes in the morning and at bedtime.     LANTUS  SOLOSTAR 100 UNIT/ML Solostar Pen Inject 100 Units into the skin daily.     levocetirizine (XYZAL ) 5 MG tablet Take 5 mg by mouth at bedtime.     metFORMIN  (GLUCOPHAGE -XR) 500 MG 24 hr tablet Take 500 mg by mouth  daily with breakfast.     metoprolol  succinate (TOPROL -XL) 25 MG 24 hr tablet Take 1 tablet (25 mg total) by mouth daily. 90 tablet 3   montelukast  (SINGULAIR ) 10 MG tablet TAKE 1 TABLET BY MOUTH EVERYDAY AT BEDTIME 90 tablet 2   Multiple Vitamin (MULTIVITAMIN) tablet Take 1 tablet by mouth daily with breakfast.     mycophenolate  (CELLCEPT ) 500 MG tablet Take 2 tablets (1,000 mg total) by mouth 2 (two) times daily. 120 tablet 0   nitroGLYCERIN  (NITROGLYN) 2 % ointment Apply 0.25 inches topically 2 (two) times daily. Apply To anus and surrounding tissue. Use pea size amount 30 g 0   nitroGLYCERIN  (NITROSTAT ) 0.4 MG SL tablet Place 1 tablet (0.4 mg total) under the tongue every 5 (five) minutes as needed for chest pain. 25 tablet 10   nystatin  cream (MYCOSTATIN ) Apply 1 Application topically 2 (two) times daily as needed (for irritation- affected sites).     omeprazole  (PRILOSEC ) 20 MG capsule Take 1 capsule (20 mg total) by mouth 2 (two) times daily before a meal. 60 capsule 5   OXYGEN  Inhale 4-5 L/min into the lungs See admin instructions. Inhale 4-5 L/min at bedtime and as needed for shortness of breath during the day     potassium chloride  (KLOR-CON  10) 10 MEQ tablet Take 1 tablet (10 mEq total) by mouth daily. 30 tablet 2   pravastatin  (PRAVACHOL ) 40 MG tablet Take 1 tablet (40 mg total) by mouth every evening. 90 tablet 1   predniSONE  (DELTASONE ) 10 MG tablet 50 mg daily  x 2 weeks and then 40 mg/day and stay there till further instructions (Patient taking differently: 30 mg. 50 mg daily  x 2 weeks and then 40 mg/day and stay there till further instructions) 100 tablet 1   PREPARATION H 0.25-50 % GEL Place 1 application  rectally 2 (two) times daily as needed (for hemorrhoids).     riTUXimab -pvvr (RUXIENCE  IV) Inject into the vein. Infuse 1000mg  at Day 0 and Day 14. Repeat cycle every 6 months     sodium chloride  (OCEAN) 0.65 % SOLN nasal spray Place 1 spray into both nostrils as needed for  congestion.     Sodium Chloride , Inhalant, 7 % NEBU Use per nebulizer once a day to help clear sputum 120 mL 3   albuterol  (PROAIR  HFA) 108 (90 Base) MCG/ACT inhaler Inhale 2 puffs into the lungs every 4 (four) hours as needed for wheezing. Or coughing spells.  You may use 2 Puffs 5-10 minutes before exercise. 1 each 3   apixaban  (ELIQUIS ) 5 MG TABS tablet Take 1 tablet (5 mg total) by mouth 2 (two) times daily. 60 tablet 5   revefenacin  (YUPELRI ) 175 MCG/3ML nebulizer solution Inhale 1 vial by nebulization daily. 90 mL 5   sulfamethoxazole -trimethoprim  (BACTRIM  DS) 800-160 MG tablet Take 1 tablet by mouth every Monday, Wednesday, and Friday.     No facility-administered medications prior to visit.     Review of Systems: Positive as mentioned in HPI  Constitutional:   No  weight loss, night sweats,  Fevers, chills, fatigue, or  lassitude.  HEENT:   No headaches,  Difficulty swallowing,  Tooth/dental problems, or  Sore throat,                No sneezing, itching, ear ache, nasal congestion, post nasal drip,   CV:  No chest pain,  Orthopnea, PND, swelling in lower extremities, anasarca, dizziness, palpitations, syncope.   GI  No heartburn, indigestion, abdominal pain,  nausea, vomiting, diarrhea, change in bowel habits, loss of appetite, bloody stools.   Resp: No shortness of breath with exertion or at rest.  No excess mucus, no productive cough,  No non-productive cough,  No coughing up of blood.  No change in color of mucus.  No wheezing.  No chest wall deformity  Skin: no rash or lesions.  GU: no dysuria, change in color of urine, no urgency or frequency.  No flank pain, no hematuria   MS:  No joint pain or swelling.  No decreased range of motion.  No back pain.    Physical Exam  BP 129/66   Pulse 85   Ht 4' 11 (1.499 m)   Wt 149 lb (67.6 kg)   SpO2 97% Comment: 3 liters  BMI 30.09 kg/m   GEN: A/Ox3; pleasant , NAD, well nourished sitting in a chair at the bedside wearing  oxygen  via nasal cannula.   HEENT:  [place]/AT,  EACs-clear, TMs-wnl, NOSE-clear, THROAT-clear, no lesions, no postnasal drip or exudate noted.   NECK:  Supple w/ fair ROM; no JVD; normal carotid impulses w/o bruits; no thyromegaly or nodules palpated; no lymphadenopathy.    RESP few scattered crackles and wheezes with fairly good aeration throughout no accessory muscle use, no dullness to percussion  CARD:  RRR, no m/r/g, 1+ peripheral edema, pulses intact, no cyanosis or clubbing.  GI:   Soft & nt; nml bowel sounds; no organomegaly or masses detected.   Musco: Warm bil, no deformities or joint swelling noted.   Neuro: alert, no focal deficits noted.    Skin: Warm, no lesions or rashes    Lab Results:  CBC    Component Value Date/Time   WBC 13.3 (H) 08/15/2024 1706   RBC 4.21 08/15/2024 1706   HGB 12.5 08/15/2024 1706   HCT 39.5 08/15/2024 1706   PLT 232 08/15/2024 1706   MCV 93.8 08/15/2024 1706   MCH 29.7 08/15/2024 1706   MCHC 31.6 08/15/2024 1706   RDW 16.2 (H) 08/15/2024 1706   LYMPHSABS 0.8 08/15/2024 1706   MONOABS 0.7 08/15/2024 1706   EOSABS 0.0 08/15/2024 1706   BASOSABS 0.1 08/15/2024 1706    BMET    Component Value Date/Time   NA 135 08/15/2024 1706   NA 142 12/08/2018 0917   K 4.4 08/15/2024 1706   CL 101 08/15/2024 1706   CO2 20 (L) 08/15/2024 1706   GLUCOSE 155 (H) 08/15/2024 1706   BUN 28 (H) 08/15/2024 1706   BUN 18 12/08/2018 0917   CREATININE 1.05 (H) 08/15/2024 1706   CREATININE 0.84 07/19/2024 0839   CALCIUM  9.3 08/15/2024 1706   GFRNONAA 55 (L) 08/15/2024 1706   GFRNONAA 66 02/26/2015 1540   GFRAA >60 10/04/2019 0216   GFRAA 77 02/26/2015 1540    BNP    Component Value Date/Time   BNP 89.7 05/22/2024 1302    ProBNP    Component Value Date/Time   PROBNP 167.0 (H) 06/14/2024 1011    Imaging: US  Abdomen Limited RUQ (LIVER/GB) Result Date: 08/15/2024 EXAM: Right Upper Quadrant Abdominal Ultrasound 08/15/2024 08:34:10 PM  TECHNIQUE: Real-time ultrasonography of the right upper quadrant of the abdomen was performed. COMPARISON: CT abdomen pelvis dated 05/24/2024. CLINICAL HISTORY: Epigastric pain. FINDINGS: LIVER: Hyperechoic hepatic parenchyma, suggesting hepatic steatosis. No intrahepatic biliary ductal dilatation. No evidence of mass. BILIARY SYSTEM: No pericholecystic fluid or wall thickening. No cholelithiasis. Negative sonographic Murphy's sign. Common bile duct measures 3 mm. OTHER: No right upper quadrant ascites. IMPRESSION: 1.  No acute findings. 2. Hepatic steatosis. Electronically signed by: Pinkie Pebbles MD 08/15/2024 08:38 PM EDT RP Workstation: [id number]    Administration History     None          Latest Ref Rng & Units 06/14/2024    8:19 AM 04/05/2024    8:21 AM 10/01/2023    9:49 AM 06/04/2023   10:12 AM 03/08/2023   10:49 AM 12/09/2022   11:48 AM 07/21/2022    9:50 AM  PFT Results  FVC-Pre L 1.48  1.52  1.43  1.44  1.45  1.53  1.54   FVC-Predicted Pre % 63  64  60  60  64  66  67   Pre FEV1/FVC % % 89  87  92  91  93  87  91   FEV1-Pre L 1.32  1.33  1.31  1.30  1.35  1.32  1.40   FEV1-Predicted Pre % 75  76  73  73  80  77  81   DLCO uncorrected ml/min/mmHg 7.71  8.29  9.73  10.82  8.11  9.64  8.16   DLCO UNC% % 46  49  58  64  50  59  50   DLCO corrected ml/min/mmHg 7.89  8.24  9.73  10.82  7.81  9.41  7.86   DLCO COR %Predicted % 47  49  58  64  48  58  48   DLVA Predicted % 76  78  81  85  65  81  64   TLC L     3.02     TLC % Predicted %     70     RV % Predicted %     70       Lab Results  Component Value Date   NITRICOXIDE 10 01/13/2016     Assessment & Plan:   Assessment & Plan ILD (interstitial lung disease) (HCC) - CellCept  on hold due to pancreatitis.  Patient is reluctant to resume this; continue to hold presently.  Revisit at follow up. -Continue Rituxan ; next dose in January 2026. - Doing fairly well on alternating doses of Prednisone  20mg /30mg ; the dose  reduction may be the cause of fluctuating oxygen  needs; continue to monitor as we continue to wean -  Continue PJP prophylaxis for now; may be able to stop if continues off Cellcept  Acute bronchitis, unspecified organism -  Resolved Rectal bleeding -  Resolved Steroid-induced hyperglycemia -  Endocrine appt scheduled for the end of October Edema, unspecified type -  Likely due to prednisone  use; May use Lasix  20mg  daily as needed for edema Chronic respiratory failure with hypoxia (HCC) -  Continue supportive oxygen  Current chronic use of systemic steroids -  Weaning as able for lowest tolerated dose; best balance of oxygen  use and steroid use to keep lung function steady is the goal History of pulmonary embolism -  Anticoagulated and bleeding has resolved with hemorrhoidal banding Extrinsic asthma without complication, unspecified asthma severity, unspecified whether persistent -  Continue Dupixent  -  Continue Albuterol  -  Resume Yuplelri; refills sent in -  GERD:  Prilosec  Rx sent in -  DME order for humidity on oxygen    Return as scheduled on 09/14/2024.  Candis Dandy, PA-C 08/28/2024

## 2024-08-29 ENCOUNTER — Other Ambulatory Visit (HOSPITAL_COMMUNITY): Payer: Self-pay

## 2024-08-29 ENCOUNTER — Other Ambulatory Visit: Payer: Self-pay

## 2024-08-30 ENCOUNTER — Encounter: Payer: Self-pay | Admitting: Skilled Nursing Facility1

## 2024-08-30 ENCOUNTER — Other Ambulatory Visit (HOSPITAL_COMMUNITY): Payer: Self-pay

## 2024-08-30 ENCOUNTER — Encounter: Attending: "Endocrinology | Admitting: Skilled Nursing Facility1

## 2024-08-30 DIAGNOSIS — E119 Type 2 diabetes mellitus without complications: Secondary | ICD-10-CM | POA: Diagnosis present

## 2024-08-30 NOTE — Progress Notes (Unsigned)
 DSME Follow up  Appointment start: 9:11 Appointment End:  9:55  Pt arrives with her supportive husband.   Pt states she is interested in restarting a PUMP: Dietitian advised she can speak with her endocrinologist about this and if a pump is to be started she will follow up with a pump educator such as Leita Constable as it is out of this Dietitians scope of practice.   Pt states she did not eat a before bed snack (as directed by endocrinologist per pt) resulting in a blood sugar of 79 drinking Gingerale and a few skittles and then raisins using the glucometer to test not the CGM to bring the blood sugar up. Pt states she is to eat a bedtime snack if her blood sugar is 110 or less she is to eat a meal of 250 calories; stating apple and peanut butter will keep her blood sugars from falling throughout the night. Pt states it is is tough to eat a bed time snack because she is still full from dinner. (After some conversation on the matter she states she likes chia seed yogurt + fruit and believes that will be good for a bedtime snack)-Dietitian advised she can speak with hee endocrinoloist about adjusting her insulin  amounts to reduce low blood sugars at night instead of feeding the insulin  in addition to looking at patterns on high prednisone  days to adjust insulin  accordingly.   Pt states she does have a low grade fever currently. Pt states some days in the week she takes 30 prednosone noticing higher blood sugars on those days.    Pt states since stopping one of her medications she has had no GI issues and is releived for this.  Pt states her A1C is 7  Pt keeps very detailed logs of her blood sugars and uses her CGM consistently.   What are one or two positive things that you are doing right now to manage your diabetes?  She was able to take her insulin  as prescribed due to no longer fearing low blood sugars as before   What is the hardest part about your diabetes right now, causing you the most  concern, or is the most worrisome to you about your diabetes?  Taking prednisone  and wanting to be back on an insulin  pump  What questions do you have today?  Some bedtime snack ideas that will not feel as heavy  Have you participated in any diabetes support group?  no  History:   A1C:  pt stated 7.0 Medications include: lantus  27 units, breakfast 11 units units, lunch 14 units, dinner 7-8 units; taking metformin  at lunch instead of breakfast  Sleep:   Weight:  telephone appt Blood Glucose:  Fasting:  111  2 hours after starting a meal:  150's; 200's  Have you had any low blood sugar readings in the past month? Not under 70  Social History:   Exercise:  ADLs   24 hour diet recall: Breakfast:  cheese toast and egg (11 units) Snack:   Lunch:   Snack:   Dinner:  spanicopita, sweet potato, tuna steak (7 units-144 blood sugar before eating) Snack:   Beverages:  water    Specific focus but not limited to the following: Review of blood glucose monitoring and interpretation including the recommended target ranges and Hgb A1c.  Review of carbohydrate counting, importance of regularly scheduled meals/snacks, and meal planning to improve quality of diet. Review of the effects of physical activity on glucose levels and long-term glucose control.  Recommended goal of 150 minutes of physical activity/week. Review of patient medications and discussed role of medication on blood glucose and possible side effects. Discussion of strategies to manage stress, psychosocial issues, and other obstacles to diabetes management. Review of short-term complications: hyper- and hypo-glycemia (causes, symptoms, and treatment options) Review of prevention, detection, and treatment of long-term complications.  Discussion of the role of prolonged elevated glucose levels on body systems.  Continuing Goals: Insulin  management with complicating factors   Future Follow up:  After her next endocrinologist appt as  she is having stable blood sugar readings and is feeling confident in her blood sugar management and insulin  management

## 2024-08-31 ENCOUNTER — Other Ambulatory Visit (HOSPITAL_COMMUNITY): Payer: Self-pay

## 2024-08-31 ENCOUNTER — Other Ambulatory Visit: Payer: Self-pay

## 2024-09-08 ENCOUNTER — Encounter: Payer: Self-pay | Admitting: Internal Medicine

## 2024-09-11 ENCOUNTER — Telehealth: Payer: Self-pay | Admitting: Internal Medicine

## 2024-09-11 ENCOUNTER — Other Ambulatory Visit (INDEPENDENT_AMBULATORY_CARE_PROVIDER_SITE_OTHER)

## 2024-09-11 DIAGNOSIS — J849 Interstitial pulmonary disease, unspecified: Secondary | ICD-10-CM

## 2024-09-11 DIAGNOSIS — J679 Hypersensitivity pneumonitis due to unspecified organic dust: Secondary | ICD-10-CM

## 2024-09-11 DIAGNOSIS — Z79899 Other long term (current) drug therapy: Secondary | ICD-10-CM | POA: Diagnosis not present

## 2024-09-11 LAB — CBC WITH DIFFERENTIAL/PLATELET
Basophils Absolute: 0.1 K/uL (ref 0.0–0.1)
Basophils Relative: 0.3 % (ref 0.0–3.0)
Eosinophils Absolute: 0.2 K/uL (ref 0.0–0.7)
Eosinophils Relative: 0.7 % (ref 0.0–5.0)
HCT: 40 % (ref 36.0–46.0)
Hemoglobin: 12.4 g/dL (ref 12.0–15.0)
Lymphocytes Relative: 9.6 % — ABNORMAL LOW (ref 12.0–46.0)
Lymphs Abs: 2.1 K/uL (ref 0.7–4.0)
MCHC: 31 g/dL (ref 30.0–36.0)
MCV: 88.4 fl (ref 78.0–100.0)
Monocytes Absolute: 0.7 K/uL (ref 0.1–1.0)
Monocytes Relative: 3 % (ref 3.0–12.0)
Neutro Abs: 18.8 K/uL — ABNORMAL HIGH (ref 1.4–7.7)
Neutrophils Relative %: 86.4 % — ABNORMAL HIGH (ref 43.0–77.0)
Platelets: 300 K/uL (ref 150.0–400.0)
RBC: 4.52 Mil/uL (ref 3.87–5.11)
RDW: 17.5 % — ABNORMAL HIGH (ref 11.5–15.5)
WBC: 21.8 K/uL (ref 4.0–10.5)

## 2024-09-11 LAB — COMPREHENSIVE METABOLIC PANEL WITH GFR
ALT: 22 U/L (ref 0–35)
AST: 18 U/L (ref 0–37)
Albumin: 3.7 g/dL (ref 3.5–5.2)
Alkaline Phosphatase: 52 U/L (ref 39–117)
BUN: 22 mg/dL (ref 6–23)
CO2: 28 meq/L (ref 19–32)
Calcium: 9.2 mg/dL (ref 8.4–10.5)
Chloride: 102 meq/L (ref 96–112)
Creatinine, Ser: 0.97 mg/dL (ref 0.40–1.20)
GFR: 57.15 mL/min — ABNORMAL LOW (ref >60.00)
Glucose, Bld: 122 mg/dL — ABNORMAL HIGH (ref 70–99)
Potassium: 3.7 meq/L (ref 3.5–5.1)
Sodium: 141 meq/L (ref 135–145)
Total Bilirubin: 0.3 mg/dL (ref 0.2–1.2)
Total Protein: 5.8 g/dL — ABNORMAL LOW (ref 6.0–8.3)

## 2024-09-11 NOTE — Telephone Encounter (Signed)
 Rhonda [name] with [facility] called and stated that this pt has a while blood cell count of 21.8

## 2024-09-11 NOTE — Telephone Encounter (Signed)
 Called Rhonda [name] at [facility] and verified that the WBC was 21.8 09/11/24 Routing to MR as urgent

## 2024-09-12 ENCOUNTER — Ambulatory Visit: Payer: Self-pay | Admitting: Internal Medicine

## 2024-09-12 NOTE — Telephone Encounter (Signed)
 I am closing this message.  Her white count is always high.  I will see her in 2 days and then decide.  I will release the results to her.

## 2024-09-12 NOTE — Progress Notes (Signed)
 Your white count is always high.  It is just higher than before right now.  We can discuss this in 2 days unless there is something emergent going on in which case please let me know.

## 2024-09-13 ENCOUNTER — Other Ambulatory Visit: Payer: Self-pay

## 2024-09-14 ENCOUNTER — Ambulatory Visit: Admitting: Internal Medicine

## 2024-09-14 ENCOUNTER — Encounter: Payer: Self-pay | Admitting: Internal Medicine

## 2024-09-14 ENCOUNTER — Telehealth: Payer: Self-pay

## 2024-09-14 ENCOUNTER — Telehealth: Payer: Self-pay | Admitting: Internal Medicine

## 2024-09-14 VITALS — BP 124/62 | HR 71 | Temp 98.5°F | Ht 60.0 in | Wt 150.8 lb

## 2024-09-14 DIAGNOSIS — J672 Bird fancier's lung: Secondary | ICD-10-CM | POA: Diagnosis not present

## 2024-09-14 DIAGNOSIS — J9610 Chronic respiratory failure, unspecified whether with hypoxia or hypercapnia: Secondary | ICD-10-CM | POA: Diagnosis not present

## 2024-09-14 DIAGNOSIS — J849 Interstitial pulmonary disease, unspecified: Secondary | ICD-10-CM

## 2024-09-14 DIAGNOSIS — Z23 Encounter for immunization: Secondary | ICD-10-CM

## 2024-09-14 DIAGNOSIS — Z79899 Other long term (current) drug therapy: Secondary | ICD-10-CM

## 2024-09-14 DIAGNOSIS — D72829 Elevated white blood cell count, unspecified: Secondary | ICD-10-CM

## 2024-09-14 DIAGNOSIS — Z8719 Personal history of other diseases of the digestive system: Secondary | ICD-10-CM

## 2024-09-14 DIAGNOSIS — J679 Hypersensitivity pneumonitis due to unspecified organic dust: Secondary | ICD-10-CM

## 2024-09-14 DIAGNOSIS — Z5181 Encounter for therapeutic drug level monitoring: Secondary | ICD-10-CM | POA: Diagnosis not present

## 2024-09-14 DIAGNOSIS — J45909 Unspecified asthma, uncomplicated: Secondary | ICD-10-CM

## 2024-09-14 NOTE — Progress Notes (Signed)
 06/14/2024 -   Chief Complaint  Patient presents with   Medical Management of Chronic Issues   Interstitial Lung Disease    Breathing has been slightly worse since last visit here. She is not coughing. She has not been using o2 much unless heavy exertion.      HPI Rhonda [name] 73 y.o. -presents for follow-up.  Presents with her husband [name].  This is posthospital follow-up.  Number of issues.- Hospitalized again 05/22/2024-7 8/25 with acute worsening of respiratory failure associated with steroid taper.  Status post small bilateral pulmonary embolism diagnosis and started on anticoagulation.  Negative bronchoscopy for organisms.  Restarted on high-dose steroid pulse dose and commented to Rituxan  cycle 1 day 0 05/25/24 and day 15 (on 06/12/2024]  - Course complicated by rectal bleeding and showed polyps anal fissure versus hemorrhoid.  -Blurred vision with high-dose steroids  At this point in time in terms of  #Interstitial lung disease: Basically over the years she seems to be requiring more and more dose of prednisone  to prevent her from having hypoxemic exacerbations.  It used to be 5 mg and then it was 10 mg and then 20 mg and then now anywhere from 40 to 60 mg.  With each taper she gets exacerbations.  With started on CellCept  in the spring 2025 but this got interrupted because of viral exacerbation.  Then we treated empirically with Bactrim  for PJP.  Then in early July 2025 she had respiratory worsening again diagnosed with a small pulmonary embolism but hypoxemia is felt to be out of proportion.  She was seen by Dr. Theophilus in the hospital.  Treated with pulsed dose steroid and also is committed to IV Rituxan .  This is since helped her hypoxemia.  Today she was able to do a sit/stand test without desaturation.  But she only did 5 times.  At home she is no longer hypoxemic room air at rest.  In the hospital she was on significant amount of oxygen .  At this point in time she is triple  immunosuppressed with prednisone  60 mg, status post Rituxan  cycle 1 and also CellCept .  She is tolerating these medicines okay except for various other symptoms which might be medication side effect [see below].  #Small PE continues on Eliquis   #Rectal bleeding in the hospital she had rectal bleeding.  She is on Eliquis  with the thought process was this was related to hemorrhoids she had colonoscopies for polyps.  She is going to have anal banding  #Dizziness: This was supposed to even before the admission.  Post discharge she fell and went to the ER head bleed was ruled out.  She states that the steroid has been lowered this is slightly better.  #Blurring of the vision this is significantly present with high-dose of steroids which is required for the lungs.  She wants to see an endocrinologist have referred her to Dr. Tawni Marking.  Dr. Darice Henle her primary care doctor is also recommending this.  #Pedal edema: She developed new onset pedal edema after discharge when I gave her Lasix  for a week it resolved but it is coming back again she is been off Lasix  for 4 days.  Will check a BNP.  Will put her on permanent Lasix .       OV 07/27/2024  Subjective:  Patient ID: Rhonda [name], female , DOB: 07/17/1951 , age 73 y.o. , MRN: [id number] , ADDRESS: [id number] [place] [id number] PCP Henle Darice  L, MD Patient Care Team: Burney Darice CROME, MD as PCP - General (Family Medicine) Geronimo Amel, MD as Consulting Physician (Pulmonary Disease) Edith, Debby BROCKS, MD (Cardiology) Wonda Cy BROCKS, RD as Dietitian (Family Medicine) Lavona Agent, MD as Consulting Physician (Cardiology)  This Provider for this visit: Treatment Team:  Attending Provider: Geronimo Amel, MD    07/27/2024 -   Chief Complaint  Patient presents with   Acute Visit    She has had some swelling in her feet over the past few days. She has had some increased SOB and increased need for o2. She has  had some prod cough with green sputum and some bloody nasal d/c.       HPI Rhonda [name] 73 y.o. -acute visit.  On Monday, July 17, 2024 she got her first dose of Dupixent .  This strategy has because she has allergic asthma as well and she is being very steroid-dependent and super immunosuppressed was to introduce biologic agent against eosinophils to reduce airway inflammation and hopefully wean her off steroids.  However after she took the first dose [2 injections] she had extreme fatigue for 4 days.  She is somewhat reluctant about doing this but then she agreed to do it.  Then on on Friday, July 21, 2024 she tapered herself down to prednisone  30 mg/day.  [Her lungs are very prednisone  responsive but she is now on triple immunosuppression and the goal is to slowly get it down to a lower dose of prednisone  in order to avoid side effects of diabetes and other prednisone  side effects] then on Sunday, 07/23/2024 she started noticing green sputum and easy desaturations.  Then on Monday, 07/24/2024 she saw Darice Burney her primary care physician who put her on doxycycline  which she is taking.  But she still had increased desaturations or easy desaturation so she called me she says she is needing 3 L oxygen  with exertion.  She is worried about this.  Advised her to increase her prednisone  to 60 mg but she did not do it and still she opted to do acute visit today.  Coincidently she also stopped Lasix  a few days ago because her edema had resolved but the edema came back and now she is starting Lasix .  She stopped her Lasix  on Friday, 07/21/2024 the same day she dropped her prednisone  down to 30 mg/day.  She is also concerned about significant hyperglycemia.  She is changing her endocrinologist to Der Irean due to personal preference.  She is most concerned about her green sputum and easy desaturations.  Along with the fact she has hyperglycemia.  Her transplant evaluation is currently on hold but I  encouraged her to revisit this.     OV 09/14/2024  Subjective:  Patient ID: Rhonda [name], female , DOB: 03-04-1951 , age 73 y.o. , MRN: [id number] , ADDRESS: [address] [place] [phone] PCP Burney Darice CROME, MD Patient Care Team: Burney Darice CROME, MD as PCP - General (Family Medicine) Geronimo Amel, MD as Consulting Physician (Pulmonary Disease) Edith, Debby BROCKS, MD (Cardiology) Wonda Cy BROCKS, RD as Dietitian (Family Medicine) Lavona Agent, MD as Consulting Physician (Cardiology)  This Provider for this visit: Treatment Team:  Attending Provider: Geronimo Amel, MD  2012 and 2013  #GE reflux with small hiatal hernia  - on ppi   #History of rapid heart rate not otherwise specified  - Start her on Lopressor   2008/2009 by primary care physician. History appears to correlate with onset of respiratory issues  -  refuses to dc this drug as of 2014 discussion   # Hypersensitivity Pneumonitis and ILD  - Potential etiologies - cockateil x 2 for 18 years through end 2012. 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 Denies mold but lives in house built in [place] with a weird baselment: and has humidifier  - first noted on CT chest 10/15/09 following trip to [place] (PE negative) but not described in 2003 CT chest report  - autoimmune panel: 10/23/11: Negative  -  Uderwennt bronch 11/19/11  - non diagnostic  - VATS Nov 2013 (done after initially refusing)- CRONIC HYPERSENSITIVITY PNEUMONITIS WITH GRANULOMA. However, there is worrying trend of UIP pattern in the Upper lobes.     Follow-up chronic hypersensitive pneumonitis ; slow progressive phenotype -  On ILD-pro registry protocol - On chronic prednisone   - 5mg  per day but stince spring 2024 needing more brsts and settled on 20mg  per day as of Nov 2024, high-dose prednisone  in spring 2025 and tapered and flared up and then back on high-dose  prednisone  in July 2025 after pulsed dose steroid in the hospital     - end July 2025 -> 50mg  x 2 weeks -> pred 40mg  daily -> 30mg  on 07/23/24 - failed ofev  due to side efect  - failed esbriet  due to sde effects  March 2022   - declind lung tx referral 2023 , reconsider this in the spring 2025  - declined cellcept  May 2024;  Started CellCept  March 2025 and then there was a pause and restarted - > STOPPED 08/15/23    - PANCREATITIS ER VISIT - STopped cellcept  Sept 2025  - Started Rituxan  July 2025   - Last high-res CT Nov 222 -> mrach 2024: no change    -Right heart cath -March 2025 with normal pulmonary pressure mean PA 19 but concern for right-to-left shunt    - Hospitalized 03/14/2024 -03/17/2024 because of acute on chronic hypoxemic respiratory flare requiring steroids.  Respiratory virus panel positive for rhinovirus  - - Follow-up in clinic 4/ 28/25 with persistent respiratory failure and decompensation clinically: Empirically treated with 3 weeks Bactrim  for empiric PJP [LDH high]  - During this time CellCept  on hold and then restarted  - Restarted Bactrim  for PJP prophylaxis after this  - Hospitalized again 05/22/2024-7 8/25 with acute worsening of respiratory failure associated with steroid taper.  Status post small bilateral pulmonary embolism diagnosis and started on anticoagulation.  Negative bronchoscopy for organisms.  Restarted on high-dose steroid pulse dose and commented to Rituxan  cycle 1 day 0 05/25/24 and day 15 (on 06/12/2024]  - Course complicated by rectal bleeding and showed polyps anal fissure versus hemorrhoid.  -Blurred vision with high-dose steroids  - Allergic asthma   - started dupixent  Jul 17, 2024 Monday    09/14/2024 -   Chief Complaint  Patient presents with   Interstitial Lung Disease    Pt states since LOV breathing has been pretty good using O2, when sitting down or resting pt doesn't always use O2 SOB occurs w/ exertion and any activity Prod cough (  phlegm green) in morning after 10:00am (phlegm white, clear)  House pt on O2 3L Out moving around  pt is using 4-5L  pulse      HPI Rhonda [name] 73 y.o. -  #Interstitial lung disease and chronic respiratory failure and immunosuppression  Miliani Deike is a 73 year old female with pulmonary fibrosis  who presents with worsening oxygenation and medication side effects.  She experiences worsening oxygenation issues, with stable levels while sitting or lying down, but significant drops during movement. Her portable oxygen  concentrator, set at four liters, is insufficient during exertion, such as walking approximately 125 feet, with levels dropping to 81-84%. At home, a larger machine maintains her oxygen  levels, but the portable unit is inadequate for outdoor use *especially pulsed use.  She has a history of pulmonary fibrosis and has been on prednisone  therapy. She started on a regular dose of 30 mg of prednisone  on July 21, 2024, after increased breathlessness and the need for supplemental oxygen  when her dose was reduced to 20 mg. Over a decae, her prednisone  requirement has increased from 5 mg to 40 mg to maintain adequate oxygenation.  Recently had tried having alternate between 20 and 30 mg of prednisone  as part of taper but this made her sugars fluctuate and so she put her back on 30 mg.  Even at 30 mg not able to maintain oxygenation.  She said at 40 mg she was able to maintain oxygenation.    She has also been on Rituxan  since July 2025.  She recently stopped taking CellCept  *late September 2025 due to adverse effects, including stomach pain, nausea, headaches, and leg swelling.  Also included ER visit for acute pancreatitis.  Since discontinuing CellCept , she feels significantly better, with the resolution of these symptoms and improved leg swelling. However, she occasionally experiences diarrhea.  She has upcoming lung transplant evaluation appointment at [facility].   She is worried about the disadvantages of lung transplant such as significant adverse drug reactions.  I have asked her to discuss this with the transplant team.  We discussed the new antifibrotic Nerandomilast and she is worried about the GI side effects but at this point in time is standard of care and approved.  We took a shared decision making for her to take this.  #Other issues - #Asthma/cough  She is currently taking Dupixent , which has helped alleviate her wheezing and cough, especially after adding a humidifier to her oxygen  concentrator. The Dupixent  injections are painful, but they have improved her respiratory symptoms.  # Nail changes She has noticed changes in her nails, specifically the development of Beau's lines and possible clubbing, which I associated  with her lung disease. She is concerned about her high white blood cell count, which has been elevated for some time, though she no longer experiences fever or night sweats, which she had during a previous episode of pancreatitis.  #Mobility - Because of increasing oxygen  needs she feels she needs a scooter - Also wrote [employer] prescription for emergency oxygen  electricity restoration.  # Edema and pancreatitis - Resolved after stopping CellCept .   #Hyperglycemia - She is now registered with Dr. Tawni Marking  #Leukocytosis - She has been running a high white count since 2020 around 15,000 but the latest shows it is now 21,800.  Cause is not known.  She does have some night sweats but they have resolved.  There is no abdominal pain and she is actually feeling good other than her worsening shortness of breath.   SYMPTOM SCALE - ILD Sept 2020 01/30/2020  08/29/2020  01/14/2021 146# 07/08/2021 147# - off esbriet . Sic past week 08/14/2021 149# - pred only. No antifibrotic 10/03/2021 148# - pred 5mg  07/21/2022 137# 10/27/2022  04/13/2023 20mg  pered - 134# 06/08/2023 135# , pred 20mg  10/06/2023 138# 04/13/2024   06/14/2024 Pred 60  Rituxan   July 2025  Cellcept   bactrim    O2 use  o2 with ex o2 withe ex 2L     2LL o2 with ex 2L with ex 2 laps on RA        Shortness of Breath  0 -> 5 scale with 5 being worst (score 6 If unable to do)               At rest 0 0 1 0 1 0 1 0 1 1  0 0 1 1   Simple tasks - showers, clothes change, eating, shaving 01 1 1 1.5 2 1 2 1 2 1 1 1 1 2    Household (dishes, doing bed, laundry) 0 3 3 3 4 2  3.5 2 3 2 2 2 3 2    Shopping 1 1 1 2 2 1 1 1 2 1 2 1 1 3    Walking level at own pace 1 2 2 1 2 2 3 1 2 2 1 3 1 2    Walking up Stairs 2 3 3 3 4 3 4 3 4 3 4  2.5 3 5    Total (30-36) Dyspnea Score 5 10 11  10.5 15 9  14.5 8 14 10 10 9 10 15    How bad is your cough? 1 3 2 2 2 2 2 2 3  0 2 1 1  0   How bad is your fatigue 1 2 2 3 2  0 3 1 2 2 4 2 5 3    How bad is nausea  0 0 3 0 0 1 0 0 0 0  0 0 0   How bad is vomiting?   0 0 0 0 0 1 0 0 0 0  0 0 0   How bad is diarrhea?  00 1 0 0 0 1 0 0 0 1  0 0 0   How bad is anxiety?  0 0 0 00 0 2 0 00 0 0  0 0 1   How bad is depression  0 2 1 1 0 1 0 0 0 0  0 0 2   00 0 0 Simple office walk 185 feet x  3 laps goal with forehead probe 04/13/2018  08/16/2018  12/15/2018  05/09/2019  08/22/2019  01/30/2020  08/29/2020  01/14/2021  07/08/2021  08/14/2021  10/03/2021  07/21/2022  10/27/2022  04/13/2023  06/08/2023  10/06/2023  04/13/2024  06/14/2024  07/27/2024  09/14/2024   O2 used Room air Room air Room air Room air Room air Room air ra ra ra ra ra ra ra ra Ra sit stand ra   ra 95% RA a rest  Number laps completed 3 3 3 3 2  stopped at 2 die to hip pain 3 laps - no hip pain following hip surgery 3 3 3  attempted byt did  only 2 3 bu stopped at 2 3 and did all 3 3 Ddi onl 2 las Sit/stand x 10 times X 10 Sit stand x 15   Walked 1 of 3 laps   Comments about pace good Moderate pace Normal, hip bothering     avg pace avg  avg avg avg Good pace        Resting Pulse Ox/HR 98% and 73/min 98% and HR 77/min 99% and HR 61/min 98% and HR 70/min 98% 98% and 75/min 97% and  67/mi 94% and 80/min 96% and HR 72 98% and 71 100% and HR 82 98% ad HR 72 100% and HR 79 97% and heart rate 74 93% and HR 83 98%  and HR 91   96% abd HR 76 95% RA at rest,   Final Pulse Ox/HR 91% and 91/min 93% and 92.min 94% and 92/min 93% and 98/min 91%  89% and 93/,imn 88% and 94 89% ad 88.nin 88% at 2nd  lap end HR 92 86% and 96 89% and HR 91 93% and HR 87 84% and HR 104 94% and heart rate 92 87% and HR 90 92% and HR 105 Walked 1 lap on room air went down to 90%. Did sit stand x 5 and did well statyed 97% 87% and HR 98 at 1 laps 87% and1/4th of lap  Desaturated </= 88% no no no no   yes almost yes yes almst no yes no yes       Desaturated <= 3% points yes yes Yes, 5 points Yes, 5 points    Yes, 5 points Yes, 8 points Yes, 12 pints Yes 11 pot 5 pots 16 pont Yes, 3 poi yes       Got Tachycardic >/= 90/min yes yes yes yes     yes yes yes no          Symptoms at end of test none none Hip pain and very mild dyspnea  Stopped due to hip pain Moderate duyspnea with mask Mild to moderate dyspnea  Severe dyspnea Mild dyspnea Severe dyspnea Very mild dyspneax moderate No dyspnea Mild dyspnea       Miscellaneous comments x x    No hip pain    Similar to lastime Symptoms out of proportion ? better worse       4L Pulse at rest 92% -> walked 1.5 LAPS (300 feet) 90%      PFT     Latest Ref Rng & Units 06/14/2024    8:19 AM 04/05/2024    8:21 AM 10/01/2023    9:49 AM 06/04/2023   10:12 AM 03/08/2023   10:49 AM 12/09/2022   11:48 AM 07/21/2022    9:50 AM  PFT Results  FVC-Pre L 1.48  1.52  1.43  1.44  1.45  1.53  1.54   FVC-Predicted Pre % 63  64  60  60  64  66  67   Pre FEV1/FVC % % 89  87  92  91  93  87  91   FEV1-Pre L 1.32  1.33  1.31  1.30  1.35  1.32  1.40   FEV1-Predicted Pre % 75  76  73  73  80  77  81   DLCO uncorrected ml/min/mmHg 7.71  8.29  9.73  10.82  8.11  9.64  8.16   DLCO UNC% % 46  49  58  64  50  59  50   DLCO corrected ml/min/mmHg 7.89  8.24  9.73  10.82  7.81  9.41  7.86   DLCO COR  %Predicted % 47  49  58  64  48  58  48   DLVA Predicted % 76  78  81  85  65  81  64   TLC L     3.02     TLC % Predicted %     70     RV % Predicted %     70          LAB RESULTS last 96 hours No results found.       has a past medical history of Allergy, Arthritis, Asthma, Cataract, Coronary artery calcification seen on CAT scan (  08/19/2017), DDD (degenerative disc disease), thoracic, Depression, DOE (dyspnea on exertion), Fatty liver, GERD (gastroesophageal reflux disease), H/O steroid therapy, Helicobacter pylori ab+, Hemorrhoids, Hiatal hernia, High cholesterol, History of chronic bronchitis, History of migraine, History of MRSA infection, Hyperlipidemia, mixed (08/19/2017), Hyperplastic colon polyp (2007), IBS (irritable bowel syndrome), Inguinal hernia, Insulin  resistance, Interstitial lung disease (HCC), MVP (mitral valve prolapse), Pneumonia, Pneumonitis, hypersensitivity (HCC), PONV (postoperative nausea and vomiting), Pre-diabetes, Pulmonary embolism (HCC), Pulmonary fibrosis (HCC), PVC (premature ventricular contraction) (08/19/2017), Rapid heart rate, Squamous cell carcinoma of skin, Tinnitus, right ear, and Vocal cord ulcer.   reports that she has never smoked. She has never used smokeless tobacco.  Past Surgical History:  Procedure Laterality Date   73 HOUR PH STUDY N/A 02/21/2018   Procedure: 24 HOUR PH STUDY;  Surgeon: Shila Gustav GAILS, MD;  Location: [facility];  Service: Endoscopy;  Laterality: N/A;   BREAST BIOPSY Right 2009   BIOPSY, pt denies   BREAST BIOPSY Left 2003   Benign    CATARACT EXTRACTION Left    CESAREAN SECTION     COLONOSCOPY     COLONOSCOPY N/A 05/28/2024   Procedure: COLONOSCOPY;  Surgeon: Albertus Gordy HERO, MD;  Location: [facility];  Service: Gastroenterology;  Laterality: N/A;   ESOPHAGEAL MANOMETRY N/A 02/21/2018   Procedure: ESOPHAGEAL MANOMETRY (EM);  Surgeon: Shila Gustav GAILS, MD;  Location: [facility];  Service: Endoscopy;   Laterality: N/A;   FOOT FRACTURE SURGERY  2006 or 2007   right   HYMENECTOMY     LUNG BIOPSY  09/28/2012   Procedure: LUNG BIOPSY;  Surgeon: Elspeth JAYSON Millers, MD;  Location: [facility] OR;  Service: Thoracic;  Laterality: N/A;  lung biopsies tims three   RIGHT HEART CATH N/A 04/04/2024   Procedure: RIGHT HEART CATH;  Surgeon: Rolan Ezra RAMAN, MD;  Location[facility] INVASIVE CV LAB;  Service: Cardiovascular;  Laterality: N/A;   SQUAMOUS CELL CARCINOMA EXCISION Left    left arm   TOTAL HIP ARTHROPLASTY Right 09/10/2015   Procedure: RIGHT TOTAL HIP ARTHROPLASTY ANTERIOR APPROACH;  Surgeon: Donnice Car, MD;  Location: [facility];  Service: Orthopedics;  Laterality: Right;   TOTAL HIP ARTHROPLASTY Left 10/03/2019   Procedure: TOTAL HIP ARTHROPLASTY ANTERIOR APPROACH;  Surgeon: Car Donnice, MD;  Location: [facility];  Service: Orthopedics;  Laterality: Left;  70 mins   TUBAL LIGATION     UPPER GI ENDOSCOPY     VIDEO ASSISTED THORACOSCOPY  09/28/2012   Procedure: VIDEO ASSISTED THORACOSCOPY;  Surgeon: Elspeth JAYSON Millers, MD;  Location: [facility] OR;  Service: Thoracic;  Laterality: Right;   VIDEO BRONCHOSCOPY  11/19/2011   Procedure: VIDEO BRONCHOSCOPY WITH FLUORO;  Surgeon: Dorethia Cave, MD;  Location: [facility] ENDOSCOPY;  Service: Endoscopy;;   VIDEO BRONCHOSCOPY Bilateral 05/23/2024   Procedure: VIDEO BRONCHOSCOPY WITHOUT FLUORO;  Surgeon: Mannam, Praveen, MD;  Location: [facility];  Service: Cardiopulmonary;  Laterality: Bilateral;    Allergies  Allergen Reactions   Ozempic (0.25 Or 0.5 Mg-Dose) [Semaglutide(0.25 Or 0.5mg -Dos)] Other (See Comments)    Paralyzed the stomach   Tape Other (See Comments)    SKIN IS VERY FRAGILE!! Burns and pulls up the skin!! SKIN BRUISES and TEARS EASILY.SABRA   Betadine [Povidone Iodine] Other (See Comments)    Blisters    Codeine Nausea And Vomiting   Garlic Diarrhea   Hydrocodone  Nausea And Vomiting   Iodine Other (See Comments)    Blisters    Macrobid [Nitrofurantoin]  Other (See Comments)    Instructed by dr not to  take    Ofev  [Nintedanib] Other (See Comments)    Abdominal pain   Onion Diarrhea   Shellfish Allergy Nausea And Vomiting   Statins Other (See Comments)    Leg pain, but Pravachol  is tolerated   Sulfa  Antibiotics Other (See Comments)    Headaches     Immunization History  Administered Date(s) Administered   Fluad Quad(high Dose 65+) 07/17/2019   Hepatitis A 05/23/2010   Hepatitis B 06/23/2006   INFLUENZA, HIGH DOSE SEASONAL PF 08/24/2016, 07/24/2017, 08/31/2018, 08/15/2020, 09/26/2020, 09/12/2021, 08/24/2022, 09/14/2024   Influenza Split 08/11/2012, 08/14/2017   Influenza Whole 09/08/2011   Influenza,inj,Quad PF,6+ Mos 09/07/2013   Influenza-Unspecified 09/26/2014, 07/27/2015, 08/24/2023   MMR 05/23/2010   Moderna Sars-Covid-2 Vaccination 05/07/2021, 07/30/2021, 01/01/2022   PFIZER(Purple Top)SARS-COV-2 Vaccination 12/16/2019, 01/08/2020, 07/18/2020, 08/11/2022   Pfizer(Comirnaty)Fall Seasonal Vaccine 12 years and older 07/24/2023   Pneumococcal Conjugate-13 03/15/2014   Pneumococcal Polysaccharide-23 01/11/2018, 09/26/2020, 08/26/2021   Respiratory Syncytial Virus Vaccine,Recomb Aduvanted(Arexvy) 07/28/2022   Td 02/22/2003   Tdap 10/28/2012   Zoster Recombinant(Shingrix) 02/15/2017, 05/17/2017   Zoster, Live 12/04/2010    Family History  Problem Relation Age of Onset   Asthma Mother    Osteoarthritis Mother    Dementia Mother 36   Lymphoma Father    Diabetes Father    Hypertension Sister    Heart disease Sister    Kidney disease Sister    Allergic rhinitis Daughter    Ovarian cancer Maternal Aunt    Breast cancer Paternal Aunt    Breast cancer Paternal Aunt    Emphysema Maternal Grandmother    Asthma Maternal Grandmother    Lung disease Maternal Grandfather    Bone cancer Paternal Grandfather    Breast cancer Cousin    Breast cancer Cousin    Breast cancer Cousin    Colon cancer Neg Hx    Angioedema Neg Hx     Eczema Neg Hx    Immunodeficiency Neg Hx    Urticaria Neg Hx      Current Outpatient Medications:    albuterol  (PROVENTIL ) (2.5 MG/3ML) 0.083% nebulizer solution, Take 3 mLs (2.5 mg total) by nebulization every 6 (six) hours as needed for wheezing or shortness of breath., Disp: 360 mL, Rfl: 0   albuterol  (VENTOLIN  HFA) 108 (90 Base) MCG/ACT inhaler, Inhale 2 puffs into the lungs every 6 (six) hours as needed for wheezing or shortness of breath., Disp: 17 each, Rfl: 6   Alpha-D-Galactosidase (BEANO PO), Take 1 tablet by mouth as needed (as directed, if eating onion or garlic)., Disp: , Rfl:    ALPRAZolam  (XANAX ) 0.25 MG tablet, Take 0.25 mg by mouth at bedtime as needed for anxiety or sleep., Disp: , Rfl:    apixaban  (ELIQUIS ) 5 MG TABS tablet, Take 1 tablet (5 mg total) by mouth 2 (two) times daily., Disp: 60 tablet, Rfl: 5   ascorbic acid (VITAMIN C) 500 MG tablet, Take 500 mg by mouth daily., Disp: , Rfl:    ASHWAGANDHA PO, Take 1 capsule by mouth every other day., Disp: , Rfl:    azelastine (ASTELIN) 0.1 % nasal spray, Place 1 spray into both nostrils in the morning. Use in each nostril as directed, Disp: , Rfl:    Calcium  Carb-Cholecalciferol (CALCIUM  500 + D PO), Take 1 tablet by mouth daily., Disp: , Rfl:    chlorpheniramine (CHLOR-TRIMETON) 4 MG tablet, Take 2 mg by mouth 2 (two) times daily as needed for allergies or rhinitis., Disp: , Rfl:    cholecalciferol (VITAMIN  D3) 25 MCG (1000 UNIT) tablet, Take 1,000 Units by mouth every other day., Disp: , Rfl:    Continuous Glucose Sensor (DEXCOM G7 SENSOR) MISC, Check blood sugar fasting and before meals and at bedtime., Disp: 4 each, Rfl: 0   cycloSPORINE (RESTASIS) 0.05 % ophthalmic emulsion, Place 1 drop into both eyes 2 (two) times daily., Disp: , Rfl:    diltiazem  (CARDIZEM  CD) 120 MG 24 hr capsule, Take 1 capsule (120 mg total) by mouth daily., Disp: 90 capsule, Rfl: 3   Dupilumab  (DUPIXENT ) 300 MG/2ML SOAJ, Inject 300 mg into the  skin every 14 (fourteen) days., Disp: 4 mL, Rfl: 2   EPINEPHrine  0.3 mg/0.3 mL IJ SOAJ injection, Inject 0.3 mg into the muscle as needed for anaphylaxis., Disp: , Rfl:    ezetimibe  (ZETIA ) 10 MG tablet, Take 1 tablet (10 mg total) by mouth daily., Disp: 90 tablet, Rfl: 3   fluticasone  (FLONASE ) 50 MCG/ACT nasal spray, Spray 2 sprays into each nostril every day., Disp: 48 g, Rfl: 12   Glucagon  (BAQSIMI  ONE PACK) 3 MG/DOSE POWD, Place 1 Device into the nose as needed (Low blood sugar with impaired consciousness)., Disp: 2 each, Rfl: 3   HUMALOG  KWIKPEN 100 UNIT/ML KwikPen, Inject 8-10 Units into the skin See admin instructions. Inject 10 units into the skin before breakfast & lunch and 8 before supper, Disp: , Rfl:    Hypromellose (NATURES TEARS OP), Place 1 drop into both eyes in the morning and at bedtime., Disp: , Rfl:    LANTUS  SOLOSTAR 100 UNIT/ML Solostar Pen, Inject 100 Units into the skin daily., Disp: , Rfl:    levocetirizine (XYZAL ) 5 MG tablet, Take 5 mg by mouth at bedtime., Disp: , Rfl:    metFORMIN  (GLUCOPHAGE -XR) 500 MG 24 hr tablet, Take 500 mg by mouth daily with breakfast., Disp: , Rfl:    metoprolol  succinate (TOPROL -XL) 25 MG 24 hr tablet, Take 1 tablet (25 mg total) by mouth daily., Disp: 90 tablet, Rfl: 3   montelukast  (SINGULAIR ) 10 MG tablet, TAKE 1 TABLET BY MOUTH EVERYDAY AT BEDTIME, Disp: 90 tablet, Rfl: 2   Multiple Vitamin (MULTIVITAMIN) tablet, Take 1 tablet by mouth daily with breakfast., Disp: , Rfl:    nitroGLYCERIN  (NITROGLYN) 2 % ointment, Apply 0.25 inches topically 2 (two) times daily. Apply To anus and surrounding tissue. Use pea size amount, Disp: 30 g, Rfl: 0   nitroGLYCERIN  (NITROSTAT ) 0.4 MG SL tablet, Place 1 tablet (0.4 mg total) under the tongue every 5 (five) minutes as needed for chest pain., Disp: 25 tablet, Rfl: 10   nystatin  (MYCOSTATIN ) 100000 UNIT/ML suspension, Take 5 mLs by mouth 4 (four) times daily., Disp: , Rfl:    nystatin  cream (MYCOSTATIN ),  Apply 1 Application topically 2 (two) times daily as needed (for irritation- affected sites)., Disp: , Rfl:    omeprazole  (PRILOSEC ) 20 MG capsule, Take 1 capsule (20 mg total) by mouth 2 (two) times daily before a meal., Disp: 60 capsule, Rfl: 5   omeprazole  (PRILOSEC ) 20 MG capsule, Take 1 capsule (20 mg total) by mouth daily., Disp: 30 capsule, Rfl: 11   OXYGEN , Inhale 4-5 L/min into the lungs See admin instructions. Inhale 4-5 L/min at bedtime and as needed for shortness of breath during the day, Disp: , Rfl:    pravastatin  (PRAVACHOL ) 40 MG tablet, Take 1 tablet (40 mg total) by mouth every evening., Disp: 90 tablet, Rfl: 1   predniSONE  (DELTASONE ) 10 MG tablet, 50 mg daily  x 2 weeks  and then 40 mg/day and stay there till further instructions, Disp: 100 tablet, Rfl: 1   PREPARATION H 0.25-50 % GEL, Place 1 application  rectally 2 (two) times daily as needed (for hemorrhoids)., Disp: , Rfl:    revefenacin  (YUPELRI ) 175 MCG/3ML nebulizer solution, Inhale 1 vial by nebulization daily., Disp: 90 mL, Rfl: 5   riTUXimab -pvvr (RUXIENCE  IV), Inject into the vein. Infuse 1000mg  at Day 0 and Day 14. Repeat cycle every 6 months, Disp: , Rfl:    sodium chloride  (OCEAN) 0.65 % SOLN nasal spray, Place 1 spray into both nostrils as needed for congestion., Disp: , Rfl:    Sodium Chloride , Inhalant, 7 % NEBU, Use per nebulizer once a day to help clear sputum, Disp: 120 mL, Rfl: 3   sulfamethoxazole -trimethoprim  (BACTRIM  DS) 800-160 MG tablet, Take 1 tablet by mouth every Monday, Wednesday, and Friday., Disp: 12 tablet, Rfl: 2   AMBULATORY NON FORMULARY MEDICATION, See admin instructions. Allergy injections on Mondays and hold when sick (Patient not taking: Reported on 09/14/2024), Disp: , Rfl:    doxycycline  (VIBRAMYCIN ) 100 MG capsule, Take 100 mg by mouth every 12 (twelve) hours. (Patient not taking: Reported on 09/14/2024), Disp: , Rfl:    Furosemide  (LASIX  PO), Take 1 tablet by mouth daily. (Patient not  taking: Reported on 09/14/2024), Disp: , Rfl:    mycophenolate  (CELLCEPT ) 500 MG tablet, Take 2 tablets (1,000 mg total) by mouth 2 (two) times daily. (Patient not taking: Reported on 09/14/2024), Disp: 120 tablet, Rfl: 0   potassium chloride  (KLOR-CON  10) 10 MEQ tablet, Take 1 tablet (10 mEq total) by mouth daily. (Patient not taking: Reported on 09/14/2024), Disp: 30 tablet, Rfl: 2      Objective:   Vitals:   09/14/24 0918  BP: 124/62  Pulse: 71  Temp: 98.5 F (36.9 C)  TempSrc: Oral  SpO2: 93%  Weight: 150 lb 12.8 oz (68.4 kg)  Height: 5' (1.524 m)    Estimated body mass index is 29.45 kg/m as calculated from the following:   Height as of this encounter: 5' (1.524 m).   Weight as of this encounter: 150 lb 12.8 oz (68.4 kg).  @WEIGHTCHANGE @  [employer]   09/14/24 0918  Weight: 150 lb 12.8 oz (68.4 kg)     Physical Exam   General: No distress. cushingoid O2 at rest: yes portabl Cane present: no Sitting in wheel chair: no Frail: no Obese: no Neuro: Alert and Oriented x 3. GCS 15. Speech normal Psych: Pleasant Resp:  Barrel Chest - no.  Wheeze - no, Crackles - diffus, No overt respiratory distress CVS: Normal heart sounds. Murmurs - no Ext: Stigmata of Connective Tissue Disease - no IMPENDING CLUBBING HEENT: Normal upper airway. PEERL +. 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 oil pain and ? 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 oil pain and ? mold in house ILD (interstitial lung disease) (HCC) Chronic respiratory failure  with hypoxia (HCC) High risk medication use Visit for monitoring Rituxan  therapy Current chronic use of systemic steroids  -Symptoms and exercise oxygen  worse even compared to September  2025.  This is indicating progressive disease. - CellCept  caused pancreatitis in September 2025 and is currently off  Plan -Start Nerandomilast antifibrotic [caution and counseled on all the side effects. 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 Though injections are painful you are improved with yoour cough and wheezing  Plan  - continue dupixent  -  - contiue humidified o2   Edema, unspecified type -could be related to prednisone  or CellCept  or diastolic dysfunction Onset was posthospitalization  - now resolved after stopping cellcept  Sept 2025  Plan  - Lasix  as needed - can stop and see - List edema as side effect for cellcept    Pancreatitis sept 2025  - glas resolved. Possibly cellcept  was cause  Plan   - no more cellcept ; list allergy  - followu with GI  Rectal bleeding -onset early July 2025 in the hospital  Plan  - per GI; support procedure  History of pulmonary embolism- small July 2025  Plan  - continue eliquis   -  if bleediong severe have to stop or lower dose  Steroid INduced  High sugar  Plan  - per Dr Tawni Hidalgo   Mobility due to hypoxemia  Plan  - motorized wheel chair  FLu vaccine  Plan  - high dose flu shot 09/14/2024   Follwuup - 4-6 weeks with Candis Dandy  Or Raamaswamy but after spiro/dlco - 12 weeks with Ramsawamy 30 min     FOLLOWUP    Return for - 4-6 weeks with Candis Dandy  Or Raamaswamy but after spiro/dlco.    SIGNATURE    Dr. Dorethia Cave, M.D., F.C.C.P,  Pulmonary and Critical Care Medicine Staff Physician, [facility] Director - Interstitial Lung Disease  Program  Pulmonary Fibrosis [facility] Network at [facility] [place], [place], [address]  Pager: [phone], If no answer or between  15:00h - 7:00h: call [phone] Telephone: [phone]  12:24 PM 09/14/2024

## 2024-09-14 NOTE — Telephone Encounter (Signed)
 Pharmacy team initiating benefits investigation in separate encounter for Rhonda [name]. Contacted patient to ensure she is okay with signing enrollment form electronically. She verbalizes understanding and will look out for email from pharmacy team with enrollment form to sign.

## 2024-09-14 NOTE — Telephone Encounter (Signed)
 Received request for new start Jascayd in separate phone encounter from Dr. Geronimo. Patient's ILD progressing quickly.   Called patient - she did not sign enrollment form here today in clinic with Dr. Geronimo. She is willing to electronically sign via [employer]. Confirmed correct email [email or web address].   Initiating benefits investigation in this thread.   Aleck Puls, PharmD, BCPS, CPP Clinical Pharmacist  [facility]

## 2024-09-14 NOTE — Patient Instructions (Addendum)
 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 oil pain and ? mold in house ILD (interstitial lung disease) (HCC) Chronic respiratory failure with hypoxia (HCC) High risk medication use Visit for monitoring Rituxan  therapy Current chronic use of systemic steroids  -Symptoms and exercise oxygen  worse even compared to September 2025.  This is indicating progressive disease. - CellCept  caused pancreatitis in September 2025 and is currently off  Plan -Start Nerandomilast antifibrotic [caution and counseled on all the side effects. 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 Though injections are painful you are improved with yoour cough and wheezing  Plan  - continue dupixent  -  - contiue humidified o2   Edema, unspecified type -could be related to prednisone  or CellCept  or diastolic dysfunction Onset was posthospitalization  - now resolved after stopping cellcept  Sept 2025  Plan  - Lasix  as needed - can stop and see - List edema as side effect for cellcept    Pancreatitis sept 2025  - glas resolved. Possibly cellcept  was cause  Plan   - no more cellcept ; list allergy  - followu with GI  Rectal bleeding -onset early July 2025 in the hospital  Plan  - per GI; support  procedure  History of pulmonary embolism- small July 2025  Plan  - continue eliquis   -  if bleediong severe have to stop or lower dose  Steroid INduced High sugar  Plan  - per Dr Tawni Hidalgo   Mobility due to hypoxemia  Plan  - motorized wheel chair  FLu vaccine  Plan  - high dose flu shot 09/14/2024   Follwuup - 4-6 weeks with Candis Dandy  Or Raamaswamy but after spiro/dlco - 12 weeks with Ramsawamy 30 min

## 2024-09-14 NOTE — Telephone Encounter (Signed)
 Rhonda [name]  *Had severe GI side effects and edema with CellCept .  Rhonda [name] also ended up with pancreatitis a month ago.  Therefore this has been stopped which is the CellCept .  At this point in time her next Rituxan  doses January 2026.  In the interim please start her on Nerandomilast urgently.  Rhonda [name] is progressing fast.     SIGNATURE    Dr. Dorethia Cave, M.D., F.C.C.P,  Pulmonary and Critical Care Medicine Staff Physician, [facility] Center Director - Interstitial Lung Disease  Program  Pulmonary Fibrosis [facility] Network at [facility] [place], [place], [address]   Pager: [phone], If no answer  -> Check AMION or Try [phone] Telephone (clinical office): [phone] Telephone (research): [phone]  12:17 PM 09/14/2024

## 2024-09-15 ENCOUNTER — Other Ambulatory Visit: Payer: Self-pay

## 2024-09-15 NOTE — Telephone Encounter (Signed)
 Submitted a Prior Authorization request to [employer] for JASCAYD via CoverMyMeds. Will update once we receive a response.  Key: [id number]

## 2024-09-15 NOTE — Progress Notes (Signed)
 Specialty Pharmacy Refill Coordination Note  Rhonda [name] is a 73 y.o. female contacted today regarding refills of specialty medication(s) Dupilumab  (Dupixent )   Patient requested Delivery   Delivery date: 09/21/24   Verified address: [address] [place] [address]   Medication will be filled on 09/20/24.

## 2024-09-18 ENCOUNTER — Encounter: Payer: Self-pay | Admitting: Internal Medicine

## 2024-09-18 ENCOUNTER — Ambulatory Visit: Admitting: Internal Medicine

## 2024-09-18 VITALS — BP 126/58 | HR 80 | Ht 60.0 in | Wt 151.2 lb

## 2024-09-18 DIAGNOSIS — E1165 Type 2 diabetes mellitus with hyperglycemia: Secondary | ICD-10-CM | POA: Diagnosis not present

## 2024-09-18 DIAGNOSIS — E1159 Type 2 diabetes mellitus with other circulatory complications: Secondary | ICD-10-CM | POA: Diagnosis not present

## 2024-09-18 DIAGNOSIS — E7800 Pure hypercholesterolemia, unspecified: Secondary | ICD-10-CM

## 2024-09-18 DIAGNOSIS — Z7984 Long term (current) use of oral hypoglycemic drugs: Secondary | ICD-10-CM

## 2024-09-18 LAB — POCT GLYCOSYLATED HEMOGLOBIN (HGB A1C): Hemoglobin A1C: 6.7 % — AB (ref 4.0–5.6)

## 2024-09-18 MED ORDER — HUMULIN N KWIKPEN 100 UNIT/ML ~~LOC~~ SUPN: 24.0000 [IU] | PEN_INJECTOR | Freq: Every day | SUBCUTANEOUS | 3 refills | Status: DC

## 2024-09-18 MED ORDER — METFORMIN HCL ER 500 MG PO TB24: 500.0000 mg | ORAL_TABLET | Freq: Two times a day (BID) | ORAL | 3 refills | Status: AC

## 2024-09-18 MED ORDER — HUMALOG KWIKPEN 100 UNIT/ML ~~LOC~~ SOPN: PEN_INJECTOR | SUBCUTANEOUS | 3 refills | Status: DC

## 2024-09-18 NOTE — Patient Instructions (Addendum)
 Please increase: - Metformin  ER 500 mg 2x a day  Change Lantus  to: - NPH 20 units in am (may need to increase to 24 units)  Adjust Humalog : - B'fast: 9-12 units - Lunch: 12-16 units - Humalog  6-8 units  TRY NOT TO EAT A SNACK AT BEDTIME.  Please return in 1.5 months.  PATIENT INSTRUCTIONS FOR TYPE 2 DIABETES:  DIET AND EXERCISE Diet and exercise is an important part of diabetic treatment.  We recommended aerobic exercise in the form of brisk walking (working between 40-60% of maximal aerobic capacity, similar to brisk walking) for 150 minutes per week (such as 30 minutes five days per week) along with 3 times per week performing 'resistance' training (using various gauge rubber tubes with handles) 5-10 exercises involving the major muscle groups (upper body, lower body and core) performing 10-15 repetitions (or near fatigue) each exercise. Start at half the above goal but build slowly to reach the above goals. If limited by weight, joint pain, or disability, we recommend daily walking in a swimming pool with water  up to waist to reduce pressure from joints while allow for adequate exercise.    BLOOD GLUCOSES Monitoring your blood glucoses is important for continued management of your diabetes. Please check your blood glucoses 2-4 times a day: fasting, before meals and at bedtime (you can rotate these measurements - e.g. one day check before the 3 meals, the next day check before 2 of the meals and before bedtime, etc.).   HYPOGLYCEMIA (low blood sugar) Hypoglycemia is usually a reaction to not eating, exercising, or taking too much insulin / other diabetes drugs.  Symptoms include tremors, sweating, hunger, confusion, headache, etc. Treat IMMEDIATELY with 15 grams of Carbs: 4 glucose tablets  cup regular juice/soda 2 tablespoons raisins 4 teaspoons sugar 1 tablespoon honey Recheck blood glucose in 15 mins and repeat above if still symptomatic/blood glucose <100.  RECOMMENDATIONS TO  REDUCE YOUR RISK OF DIABETIC COMPLICATIONS: * Take your prescribed MEDICATION(S) * Follow a DIABETIC diet: Complex carbs, fiber rich foods, (monounsaturated and polyunsaturated) fats * AVOID saturated/trans fats, high fat foods, >2,300 mg salt per day. * EXERCISE at least 5 times a week for 30 minutes or preferably daily.  * DO NOT SMOKE OR DRINK more than 1 drink a day. * Check your FEET every day. Do not wear tightfitting shoes. Contact us  if you develop an ulcer * See your EYE doctor once a year or more if needed * Get a FLU shot once a year * Get a PNEUMONIA vaccine once before and once after age 79 years  GOALS:  * Your Hemoglobin A1c of <7%  * fasting sugars need to be 80-130 * after meals sugars need to be <180 (2h after you start eating) * Your Systolic BP should be 130 or lower  * Your Diastolic BP should be 80 or lower  * Your HDL (Good Cholesterol) should be 40 or higher  * Your LDL (Bad Cholesterol) should be ideally <70. * Your Triglycerides should be 150 or lower  * Your Urine microalbumin (kidney function) should be <30 * Your Body Mass Index should be 25 or lower   Please consider the following ways to cut down carbs and fat and increase fiber and micronutrients in your diet: - substitute whole grain for white bread or pasta - substitute brown rice for white rice - substitute 90-calorie flat bread pieces for slices of bread when possible - substitute sweet potatoes or yams for white potatoes - substitute  humus for margarine - substitute tofu for cheese when possible - substitute almond or rice milk for regular milk (would not drink soy milk daily due to concern for soy estrogen influence on breast cancer risk) - substitute dark chocolate for other sweets when possible - substitute water  - can add lemon or orange slices for taste - for diet sodas (artificial sweeteners will trick your body that you can eat sweets without getting calories and will lead you to overeating  and weight gain in the long run) - do not skip breakfast or other meals (this will slow down the metabolism and will result in more weight gain over time)  - can try smoothies made from fruit and almond/rice milk in am instead of regular breakfast - can also try old-fashioned (not instant) oatmeal made with almond/rice milk in am - order the dressing on the side when eating salad at a restaurant (pour less than half of the dressing on the salad) - eat as little meat as possible - can try juicing, but should not forget that juicing will get rid of the fiber, so would alternate with eating raw veg./fruits or drinking smoothies - use as little oil as possible, even when using olive oil - can dress a salad with a mix of balsamic vinegar and lemon juice, for e.g. - use agave nectar, stevia sugar, or regular sugar rather than artificial sweateners - steam or broil/roast veggies  - snack on veggies/fruit/nuts (unsalted, preferably) when possible, rather than processed foods - reduce or eliminate aspartame in diet (it is in diet sodas, chewing gum, etc) Read the labels!  Try to read Dr. Rosalynn Points book: Program for Reversing Diabetes for other ideas for healthy eating.

## 2024-09-18 NOTE — Telephone Encounter (Signed)
 Received a fax regarding Prior Authorization from [employer] for JASCAYD. Authorization has been DENIED because you asked for the drug for your Interstitial Pulmonary Disease, unspecified and Hypersensitivity pneumonitis due to unspecified organic dust. This is an off-label use that is not medically accepted.  Phone# [phone]

## 2024-09-18 NOTE — Addendum Note (Signed)
 Addended by: CLEOTILDE ROLIN RAMAN on: 09/18/2024 04:47 PM   Modules accepted: Orders

## 2024-09-18 NOTE — Progress Notes (Signed)
 Patient ID: Rhonda [name], female   DOB: April 23, 1951, 73 y.o.   MRN: [id number]  HPI: Rhonda [name] is a 73 y.o.-year-old female, referred by her PCP, Dr. Burney, for management of DM2, dx in 2000 (prev. IR), insulin -dependent since 12/2023, uncontrolled, with  complications (aortic and coronary atherosclerosis).  Patient was previously seen by Dr. Motwani and would like to transfer to see me. She is here with her husband.  She was late 30 minutes for the appointment.  Patient has a history of ILD (Hypersensitivity pneumonitis) and is on prednisone  and Dupixent .  She has been on Prednisone  x 12 years.  She mentions this is the only medication that works for her - not able to decrease the dose <30 mg daily in last year. Now on 30 mg daily.  Also on Oxygen .  Reviewed HbA1c: Lab Results  Component Value Date   HGBA1C 7.2 (H) 03/15/2024   HGBA1C 6.2 (H) 09/28/2019   HGBA1C 6.0 (H) 12/08/2018   HGBA1C 6.1 (H) 08/21/2015   HGBA1C 5.9 01/22/2015   HGBA1C 5.8 02/08/2014   HGBA1C 5.9 10/28/2012   HGBA1C 5.9 01/18/2012   Pt is on a regimen of: - Metformin  ER 500 mg with b'fast - Lantus  28 units in am - Humalog  11-16-8 units 3x a day, before meals, + sliding scale (target 150, ISF 40) Patient was on an insulin  pump (Omnipod) but was taken off 02/2024 due to steroids and 2 hospitalizations.  She is not completely opposed to starting this back if needed. She was on Ozempic but developed gastroparesis.  Pt checks her sugars more than 4 times a day with the Dexcom G7 CGM:  Lowest sugar was 70; she has hypoglycemia awareness at 70.  She does have Baqsimi  at home. Highest sugar was 200s.  Glucometer:Accuchek  She is seeing nutrition.   Pt's meals are: - Breakfast: chia seed pudding + yoghurt + almond milk; Dave's bread + PB - Lunch: meat + starch + some veggies - Dinner: salmon or other meat + starch + greens - Snacks: hummus (makes her own), chickpeas  - no history of CKD, last  BUN/creatinine:  Lab Results  Component Value Date   BUN 22 09/11/2024   BUN 28 (H) 08/15/2024   CREATININE 0.97 09/11/2024   CREATININE 1.05 (H) 08/15/2024   Lab Results  Component Value Date   MICRALBCREAT 13 07/19/2024  She is not on an ACE inhibitor/ARB.  - + HL; last set of lipids: Lab Results  Component Value Date   CHOL 220 (H) 07/19/2024   HDL 94 07/19/2024   LDLCALC 102 (H) 07/19/2024   TRIG 147 07/19/2024   CHOLHDL 2.3 07/19/2024  She is on pravastatin  40 mg daily and Zetia  10 mg daily. She does have aortic and coronary artery atherosclerosis per review of her CT scan report from 04/2024.  - last eye exam was in 07/2024. No DR reportedly. Dr. Charmayne. + Dry eyes.  - no numbness and tingling in her feet.   Pt has FH of DM in daughter, father, sister.  She also has a history of HTN, MVP, history of acute PE, pulmonary hypertension, asthma, IBS, hiatal hernia, GERD, overweight, DDD, osteoporosis. Also, she was in the emergency room with acute pancreatitis 08/15/2024.  A lipase level was 149.  ROS: Constitutional: + weight gain, + fatigue, + hot flashes, no nocturia Eyes: + blurry vision, + xerophthalmia ENT: no sore throat, no nodules palpated in neck, no dysphagia, no odynophagia, no hoarseness, no tinnitus, no  hypoacusis Cardiovascular: no CP, + SOB, + palpitations, no leg swelling Respiratory: + Cough, + shortness of breath, + wheezing Gastrointestinal: no N, no V, + D, no C, no acid reflux Musculoskeletal: no muscle, no joint aches Skin: no rash, no hair loss, + easy bruising, + excessive hair growth on chin Neurological: no tremors, no numbness or tingling/no dizziness/no HAs  Past Medical History:  Diagnosis Date   Allergy    Arthritis    history spinal stenosis. osteoarthritis right hip   Asthma    Cataract    Coronary artery calcification seen on CAT scan 08/19/2017   >300 on CT scan 08/2017   DDD (degenerative disc disease), thoracic    Depression     DOE (dyspnea on exertion)    a. 04/2010 Lexi MV EF 71%, no ischemia/infarct;     Fatty liver    GERD (gastroesophageal reflux disease)    H/O steroid therapy    Steroid use orally over 4 yrs- for Lung Fibrosis   Helicobacter pylori ab+    Hemorrhoids    Hiatal hernia    High cholesterol    History of chronic bronchitis    as child   History of migraine    History of MRSA infection    Hyperlipidemia, mixed 08/19/2017   Hyperplastic colon polyp 2007   IBS (irritable bowel syndrome)    Inguinal hernia    right   Insulin  resistance    past   Interstitial lung disease (HCC)    MVP (mitral valve prolapse)    Posterior mitral valve leaflet with mild MR   Pneumonia    Pneumonitis, hypersensitivity (HCC)    a. 09/2012 s/p Bx - ? 2/2 bird, mold, oil paint exposure ->on steroids, followed by pulm.   PONV (postoperative nausea and vomiting)    Pre-diabetes    takes Metformin    Pulmonary embolism (HCC)    Pulmonary fibrosis (HCC)    Dr. Geronimo follows- stable at present   PVC (premature ventricular contraction) 08/19/2017   Rapid heart rate    Dr. Shlomo follows- last visit Epic note 9'16   Squamous cell carcinoma of skin    Tinnitus, right ear    Vocal cord ulcer    Past Surgical History:  Procedure Laterality Date   36 HOUR PH STUDY N/A 02/21/2018   Procedure: 24 HOUR PH STUDY;  Surgeon: Shila Gustav GAILS, MD;  Location: [facility];  Service: Endoscopy;  Laterality: N/A;   BREAST BIOPSY Right 2009   BIOPSY, pt denies   BREAST BIOPSY Left 2003   Benign    CATARACT EXTRACTION Left    CESAREAN SECTION     COLONOSCOPY     COLONOSCOPY N/A 05/28/2024   Procedure: COLONOSCOPY;  Surgeon: Albertus Gordy HERO, MD;  Location: [facility];  Service: Gastroenterology;  Laterality: N/A;   ESOPHAGEAL MANOMETRY N/A 02/21/2018   Procedure: ESOPHAGEAL MANOMETRY (EM);  Surgeon: Shila Gustav GAILS, MD;  Location: [facility];  Service: Endoscopy;  Laterality: N/A;   FOOT FRACTURE SURGERY   2006 or 2007   right   HYMENECTOMY     LUNG BIOPSY  09/28/2012   Procedure: LUNG BIOPSY;  Surgeon: Elspeth JAYSON Millers, MD;  Location: [facility] OR;  Service: Thoracic;  Laterality: N/A;  lung biopsies tims three   RIGHT HEART CATH N/A 04/04/2024   Procedure: RIGHT HEART CATH;  Surgeon: Rolan Ezra RAMAN, MD;  Location: [facility] INVASIVE CV LAB;  Service: Cardiovascular;  Laterality: N/A;   SQUAMOUS CELL CARCINOMA EXCISION Left  left arm   TOTAL HIP ARTHROPLASTY Right 09/10/2015   Procedure: RIGHT TOTAL HIP ARTHROPLASTY ANTERIOR APPROACH;  Surgeon: Donnice Car, MD;  Location: [facility];  Service: Orthopedics;  Laterality: Right;   TOTAL HIP ARTHROPLASTY Left 10/03/2019   Procedure: TOTAL HIP ARTHROPLASTY ANTERIOR APPROACH;  Surgeon: Car Donnice, MD;  Location: [facility];  Service: Orthopedics;  Laterality: Left;  70 mins   TUBAL LIGATION     UPPER GI ENDOSCOPY     VIDEO ASSISTED THORACOSCOPY  09/28/2012   Procedure: VIDEO ASSISTED THORACOSCOPY;  Surgeon: Elspeth JAYSON Millers, MD;  Location: [facility] OR;  Service: Thoracic;  Laterality: Right;   VIDEO BRONCHOSCOPY  11/19/2011   Procedure: VIDEO BRONCHOSCOPY WITH FLUORO;  Surgeon: Dorethia Cave, MD;  Location: [facility] ENDOSCOPY;  Service: Endoscopy;;   VIDEO BRONCHOSCOPY Bilateral 05/23/2024   Procedure: VIDEO BRONCHOSCOPY WITHOUT FLUORO;  Surgeon: Mannam, Praveen, MD;  Location: [facility];  Service: Cardiopulmonary;  Laterality: Bilateral;   Social History   Socioeconomic History   Marital status: Married    Spouse name: [name]   Number of children: 1   Years of education: Not on file   Highest education level: Master's degree (e.g., MA, MS, MEng, MEd, MSW, MBA)  Occupational History   Occupation: SELF EMPLOYED    Comment: careers information officer, retired   Occupation: retired    Associate Professor: [employer]  Tobacco Use   Smoking status: Never   Smokeless tobacco: Never   Tobacco comments:    pt states she experimented in Designer, Multimedia    Vaping status: Never Used  Substance and Sexual Activity   Alcohol use: Not Currently    Alcohol/week: 2.0 standard drinks of alcohol    Types: 1 Glasses of wine, 1 Standard drinks or equivalent per week   Drug use: No   Sexual activity: Never    Birth control/protection: None    Comment: sex partners in the last 12 months 0  Other Topics Concern   Not on file  Social History Narrative   Married; doctor, general practice      Exercise - swimming daily for 20-30 minutes   Patient was started her periods at 73 year old (regular periods), and painful periods      Social Drivers of Corporate Investment Banker Strain: Not on file  Food Insecurity: No Food Insecurity (05/23/2024)   Hunger Vital Sign    Worried About Running Out of Food in the Last Year: Never true    Ran Out of Food in the Last Year: Never true  Transportation Needs: No Transportation Needs (05/23/2024)   PRAPARE - Administrator, Civil Service (Medical): No    Lack of Transportation (Non-Medical): No  Physical Activity: Not on file  Stress: Not on file  Social Connections: Socially Integrated (05/23/2024)   Social Connection and Isolation Panel    Frequency of Communication with Friends and Family: More than three times a week    Frequency of Social Gatherings with Friends and Family: More than three times a week    Attends Religious Services: 1 to 4 times per year    Active Member of [employer] or Organizations: No    Attends Engineer, Structural: More than 4 times per year    Marital Status: Married  Catering Manager Violence: Not At Risk (05/23/2024)   Humiliation, Afraid, Rape, and Kick questionnaire    Fear of Current or Ex-Partner: No    Emotionally Abused: No    Physically  Abused: No    Sexually Abused: No   Current Outpatient Medications on File Prior to Visit  Medication Sig Dispense Refill   albuterol  (PROVENTIL ) (2.5 MG/3ML) 0.083% nebulizer solution Take 3 mLs (2.5 mg total) by nebulization  every 6 (six) hours as needed for wheezing or shortness of breath. 360 mL 0   albuterol  (VENTOLIN  HFA) 108 (90 Base) MCG/ACT inhaler Inhale 2 puffs into the lungs every 6 (six) hours as needed for wheezing or shortness of breath. 17 each 6   Alpha-D-Galactosidase (BEANO PO) Take 1 tablet by mouth as needed (as directed, if eating onion or garlic).     ALPRAZolam  (XANAX ) 0.25 MG tablet Take 0.25 mg by mouth at bedtime as needed for anxiety or sleep.     AMBULATORY NON FORMULARY MEDICATION See admin instructions. Allergy injections on Mondays and hold when sick (Patient not taking: Reported on 09/14/2024)     apixaban  (ELIQUIS ) 5 MG TABS tablet Take 1 tablet (5 mg total) by mouth 2 (two) times daily. 60 tablet 5   ascorbic acid (VITAMIN C) 500 MG tablet Take 500 mg by mouth daily.     ASHWAGANDHA PO Take 1 capsule by mouth every other day.     azelastine (ASTELIN) 0.1 % nasal spray Place 1 spray into both nostrils in the morning. Use in each nostril as directed     Calcium  Carb-Cholecalciferol (CALCIUM  500 + D PO) Take 1 tablet by mouth daily.     chlorpheniramine (CHLOR-TRIMETON) 4 MG tablet Take 2 mg by mouth 2 (two) times daily as needed for allergies or rhinitis.     cholecalciferol (VITAMIN D3) 25 MCG (1000 UNIT) tablet Take 1,000 Units by mouth every other day.     Continuous Glucose Sensor (DEXCOM G7 SENSOR) MISC Check blood sugar fasting and before meals and at bedtime. 4 each 0   cycloSPORINE (RESTASIS) 0.05 % ophthalmic emulsion Place 1 drop into both eyes 2 (two) times daily.     diltiazem  (CARDIZEM  CD) 120 MG 24 hr capsule Take 1 capsule (120 mg total) by mouth daily. 90 capsule 3   doxycycline  (VIBRAMYCIN ) 100 MG capsule Take 100 mg by mouth every 12 (twelve) hours. (Patient not taking: Reported on 09/14/2024)     Dupilumab  (DUPIXENT ) 300 MG/2ML SOAJ Inject 300 mg into the skin every 14 (fourteen) days. 4 mL 2   EPINEPHrine  0.3 mg/0.3 mL IJ SOAJ injection Inject 0.3 mg into the muscle as  needed for anaphylaxis.     ezetimibe  (ZETIA ) 10 MG tablet Take 1 tablet (10 mg total) by mouth daily. 90 tablet 3   fluticasone  (FLONASE ) 50 MCG/ACT nasal spray Spray 2 sprays into each nostril every day. 48 g 12   Furosemide  (LASIX  PO) Take 1 tablet by mouth daily. (Patient not taking: Reported on 09/14/2024)     Glucagon  (BAQSIMI  ONE PACK) 3 MG/DOSE POWD Place 1 Device into the nose as needed (Low blood sugar with impaired consciousness). 2 each 3   HUMALOG  KWIKPEN 100 UNIT/ML KwikPen Inject 8-10 Units into the skin See admin instructions. Inject 10 units into the skin before breakfast & lunch and 8 before supper     Hypromellose (NATURES TEARS OP) Place 1 drop into both eyes in the morning and at bedtime.     LANTUS  SOLOSTAR 100 UNIT/ML Solostar Pen Inject 100 Units into the skin daily.     levocetirizine (XYZAL ) 5 MG tablet Take 5 mg by mouth at bedtime.     metFORMIN  (GLUCOPHAGE -XR) 500 MG 24  hr tablet Take 500 mg by mouth daily with breakfast.     metoprolol  succinate (TOPROL -XL) 25 MG 24 hr tablet Take 1 tablet (25 mg total) by mouth daily. 90 tablet 3   montelukast  (SINGULAIR ) 10 MG tablet TAKE 1 TABLET BY MOUTH EVERYDAY AT BEDTIME 90 tablet 2   Multiple Vitamin (MULTIVITAMIN) tablet Take 1 tablet by mouth daily with breakfast.     mycophenolate  (CELLCEPT ) 500 MG tablet Take 2 tablets (1,000 mg total) by mouth 2 (two) times daily. (Patient not taking: Reported on 09/14/2024) 120 tablet 0   nitroGLYCERIN  (NITROGLYN) 2 % ointment Apply 0.25 inches topically 2 (two) times daily. Apply To anus and surrounding tissue. Use pea size amount 30 g 0   nitroGLYCERIN  (NITROSTAT ) 0.4 MG SL tablet Place 1 tablet (0.4 mg total) under the tongue every 5 (five) minutes as needed for chest pain. 25 tablet 10   nystatin  (MYCOSTATIN ) 100000 UNIT/ML suspension Take 5 mLs by mouth 4 (four) times daily.     nystatin  cream (MYCOSTATIN ) Apply 1 Application topically 2 (two) times daily as needed (for irritation-  affected sites).     omeprazole  (PRILOSEC ) 20 MG capsule Take 1 capsule (20 mg total) by mouth 2 (two) times daily before a meal. 60 capsule 5   omeprazole  (PRILOSEC ) 20 MG capsule Take 1 capsule (20 mg total) by mouth daily. 30 capsule 11   OXYGEN  Inhale 4-5 L/min into the lungs See admin instructions. Inhale 4-5 L/min at bedtime and as needed for shortness of breath during the day     potassium chloride  (KLOR-CON  10) 10 MEQ tablet Take 1 tablet (10 mEq total) by mouth daily. (Patient not taking: Reported on 09/14/2024) 30 tablet 2   pravastatin  (PRAVACHOL ) 40 MG tablet Take 1 tablet (40 mg total) by mouth every evening. 90 tablet 1   predniSONE  (DELTASONE ) 10 MG tablet 50 mg daily  x 2 weeks and then 40 mg/day and stay there till further instructions 100 tablet 1   PREPARATION H 0.25-50 % GEL Place 1 application  rectally 2 (two) times daily as needed (for hemorrhoids).     revefenacin  (YUPELRI ) 175 MCG/3ML nebulizer solution Inhale 1 vial by nebulization daily. 90 mL 5   riTUXimab -pvvr (RUXIENCE  IV) Inject into the vein. Infuse 1000mg  at Day 0 and Day 14. Repeat cycle every 6 months     sodium chloride  (OCEAN) 0.65 % SOLN nasal spray Place 1 spray into both nostrils as needed for congestion.     Sodium Chloride , Inhalant, 7 % NEBU Use per nebulizer once a day to help clear sputum 120 mL 3   sulfamethoxazole -trimethoprim  (BACTRIM  DS) 800-160 MG tablet Take 1 tablet by mouth every Monday, Wednesday, and Friday. 12 tablet 2   No current facility-administered medications on file prior to visit.   Allergies  Allergen Reactions   Ozempic (0.25 Or 0.5 Mg-Dose) [Semaglutide(0.25 Or 0.5mg -Dos)] Other (See Comments)    Paralyzed the stomach   Tape Other (See Comments)    SKIN IS VERY FRAGILE!! Burns and pulls up the skin!! SKIN BRUISES and TEARS EASILY.SABRA   Betadine [Povidone Iodine] Other (See Comments)    Blisters    Codeine Nausea And Vomiting   Garlic Diarrhea   Hydrocodone  Nausea And  Vomiting   Iodine Other (See Comments)    Blisters    Macrobid [Nitrofurantoin] Other (See Comments)    Instructed by dr not to take    Ofev  [Nintedanib] Other (See Comments)    Abdominal pain   Onion  Diarrhea   Shellfish Allergy Nausea And Vomiting   Statins Other (See Comments)    Leg pain, but Pravachol  is tolerated   Sulfa  Antibiotics Other (See Comments)    Headaches    Family History  Problem Relation Age of Onset   Asthma Mother    Osteoarthritis Mother    Dementia Mother 42   Lymphoma Father    Diabetes Father    Hypertension Sister    Heart disease Sister    Kidney disease Sister    Allergic rhinitis Daughter    Ovarian cancer Maternal Aunt    Breast cancer Paternal Aunt    Breast cancer Paternal Aunt    Emphysema Maternal Grandmother    Asthma Maternal Grandmother    Lung disease Maternal Grandfather    Bone cancer Paternal Grandfather    Breast cancer Cousin    Breast cancer Cousin    Breast cancer Cousin    Colon cancer Neg Hx    Angioedema Neg Hx    Eczema Neg Hx    Immunodeficiency Neg Hx    Urticaria Neg Hx    PE: BP (!) 126/58   Pulse 80   Ht 5' (1.524 m)   Wt 151 lb 3.2 oz (68.6 kg)   SpO2 97%   BMI 29.53 kg/m  Wt Readings from Last 3 Encounters:  09/18/24 151 lb 3.2 oz (68.6 kg)  09/14/24 150 lb 12.8 oz (68.4 kg)  08/28/24 149 lb (67.6 kg)   Constitutional: overweight, in NAD Eyes:  EOMI, no exophthalmos ENT: no neck masses, no cervical lymphadenopathy Cardiovascular: RRR, No MRG Respiratory: CTA B Musculoskeletal: no deformities Skin:no rashes Neurological: no tremor with outstretched hands Diabetic Foot Exam - Simple   Simple Foot Form Diabetic Foot exam was performed with the following findings: Yes 09/18/2024  4:16 PM  Visual Inspection No deformities, no ulcerations, no other skin breakdown bilaterally: Yes Sensation Testing Intact to touch and monofilament testing bilaterally: Yes Pulse Check Posterior Tibialis and  Dorsalis pulse intact bilaterally: Yes Comments    ASSESSMENT: 1. DM2, insulin -dependent, uncontrolled, with  complications - Aortic and coronary atherosclerosis  2. HL  PLAN:  1. Patient with long-standing, uncontrolled diabetes, on oral antidiabetic regimen, with low-dose metformin , and also basal/bolus insulin  regimen.  Latest HbA1c was higher, at 7.2%.  At today's visit, HbA1c is better, at 6.7%. -She is on repeated courses of prednisone  for her lung condition and she needs to stay on at least 30 units daily, but may need higher doses during exacerbations.  She has been on an insulin  pump, OmniPod 5, but she came off earlier in the year.  She is not opposed to starting this back, if needed, but I did not necessarily recommend this due to her prednisone  courses and the occasional need for even higher doses of prednisone . CGM interpretation: -At today's visit, we reviewed her CGM downloads: It appears that 68% of values are in target range (goal >70%), while 32% are higher than 180 (goal <25%), and 0% are lower than 70 (goal <4%).  The calculated average blood sugar is 162.  The projected HbA1c for the next 3 months (GMI) is 7.2%. -Reviewing the CGM trends, sugars appear to be improving overnight, with the exception of occasional highs in the middle of the night, which upon questioning her due to having to have a snack at night.  Upon questioning, she feels that her sugars are dropping too low if she is not eating at night, although she would prefer not  to snack after dinner.  Since the sugars are high throughout the day, which is not uncommon during prednisone  treatment and dropping overnight, we discussed about trying to switch her basal insulin  from prednisone  to a.m. NPH.  I explained that this is a 12-hour insulin  with a pharmacodynamic profile that matches the hyperglycemic peak of prednisone .  Switching to this insulin  will allow her to not have the snack at night to prevent hypoglycemia  overnight.  To prevent hyperglycemia overnight, I advised her to increase her metformin  dose to twice a day.  She tolerated well.  I also advised her to try to vary the dose of Humalog  based on the size of her meals. - We did discuss that the doses given are just a starting point, she can adjust the doses based on the blood sugars.  If she has to increase the prednisone , we will need to increase the doses. - She is doing a good job checking her blood sugars with a glucometer at times to make sure that the CGM values are accurate. - She is usually having a mostly plant-based diet but does have meat with lunch and dinner.  She is cooking all of her meals and not eating out due to allergy to onion and garlic - She is worried about exogenous Cushing's syndrome.  She does have truncal disposition fat and a moon facies.  Unfortunately, we cannot influence the distribution of the fact that we can reduce the amount by diet.  Stopping the snack at night should help. - I suggested to:  Patient Instructions  Please increase: - Metformin  ER 500 mg 2x a day  Change Lantus  to: - NPH 20 units in am (may need to increase to 24 units)  Adjust Humalog : - B'fast: 9-12 units - Lunch: 12-16 units - Humalog  6-8 units  TRY NOT TO EAT A SNACK AT BEDTIME.  Please return in 1.5 months.  - discussed about CBG targets for treatment: 80-130 mg/dL before meals and <819 mg/dL after meals; target YaJ8r <7%. - given foot care handout and explained the principles.  She is walking barefoot at home and I advised her to try to stop this. - given instructions for hypoglycemia management 15-15 rule  - advised for yearly eye exams  -she sees Dr. Charmayne.  She will let him know to send me her next eye exam. - Return to clinic in 1.5 mo  2. HL - Reviewed latest lipid panel: LDL above our target of less than 55, otherwise fractions at goal: Lab Results  Component Value Date   CHOL 220 (H) 07/19/2024   HDL 94 07/19/2024    LDLCALC 102 (H) 07/19/2024   TRIG 147 07/19/2024   CHOLHDL 2.3 07/19/2024  - Continues pravastatin  40 mg daily and Zetia  10 mg daily without side effects.  - time spent for the visit: 40 min outside CGM analysis, in precharting, post charting, reviewing her previous labs, imaging evaluations, latest office visit note from Dr. Dartha, and previous treatments, counseling her about her diabetes (please see the discussed topics above), and developing a plan to investigate and treat it. She had a number of questions which I addressed.    Lela Fendt, MD PhD [facility] Endocrinology

## 2024-09-19 ENCOUNTER — Encounter: Payer: Self-pay | Admitting: Internal Medicine

## 2024-09-19 ENCOUNTER — Encounter: Admitting: Internal Medicine

## 2024-09-19 ENCOUNTER — Ambulatory Visit: Admitting: Internal Medicine

## 2024-09-19 ENCOUNTER — Encounter: Payer: Self-pay | Admitting: "Endocrinology

## 2024-09-19 VITALS — BP 110/50 | HR 62 | Ht 60.0 in | Wt 150.5 lb

## 2024-09-19 DIAGNOSIS — K648 Other hemorrhoids: Secondary | ICD-10-CM

## 2024-09-19 DIAGNOSIS — K641 Second degree hemorrhoids: Secondary | ICD-10-CM | POA: Diagnosis not present

## 2024-09-19 NOTE — Patient Instructions (Signed)
 HEMORRHOID BANDING PROCEDURE    FOLLOW-UP CARE   The procedure you have had should have been relatively painless since the banding of the area involved does not have nerve endings and there is no pain sensation.  The rubber band cuts off the blood supply to the hemorrhoid and the band may fall off as soon as 48 hours after the banding (the band may occasionally be seen in the toilet bowl following a bowel movement). You may notice a temporary feeling of fullness in the rectum which should respond adequately to plain Tylenol  or Motrin.  Following the banding, avoid strenuous exercise that evening and resume full activity the next day.  A sitz bath (soaking in a warm tub) or bidet is soothing, and can be useful for cleansing the area after bowel movements.     To avoid constipation, take two tablespoons of natural wheat bran, natural oat bran, flax, Benefiber or any over the counter fiber supplement and increase your water  intake to 7-8 glasses daily.    Unless you have been prescribed anorectal medication, do not put anything inside your rectum for two weeks: No suppositories, enemas, fingers, etc.  Occasionally, you may have more bleeding than usual after the banding procedure.  This is often from the untreated hemorrhoids rather than the treated one.  Don't be concerned if there is a tablespoon or so of blood.  If there is more blood than this, lie flat with your bottom higher than your head and apply an ice pack to the area. If the bleeding does not stop within a half an hour or if you feel faint, call our office at [phone] or go to the emergency room.  Problems are not common; however, if there is a substantial amount of bleeding, severe pain, chills, fever or difficulty passing urine (very rare) or other problems, you should call us  at [phone] or report to the nearest emergency room.  Do not stay seated continuously for more than 2-3 hours for a day or two after the procedure.   Tighten your buttock muscles 10-15 times every two hours and take 10-15 deep breaths every 1-2 hours.  Do not spend more than a few minutes on the toilet if you cannot empty your bowel; instead re-visit the toilet at a later time.   _______________________________________________________  If your blood pressure at your visit was 140/90 or greater, please contact your primary care physician to follow up on this.  _______________________________________________________  If you are age 75 or older, your body mass index should be between 23-30. Your Body mass index is 29.39 kg/m. If this is out of the aforementioned range listed, please consider follow up with your Primary Care Provider.  If you are age 104 or younger, your body mass index should be between 19-25. Your Body mass index is 29.39 kg/m. If this is out of the aformentioned range listed, please consider follow up with your Primary Care Provider.   ________________________________________________________  The [facility] providers would like to encourage you to use MYCHART to communicate with providers for non-urgent requests or questions.  Due to long hold times on the telephone, sending your provider a message by [facility] may be a faster and more efficient way to get a response.  Please allow 48 business hours for a response.  Please remember that this is for non-urgent requests.  _______________________________________________________  [facility] is using a team-based approach to care.  Your team is made up of your doctor and two  to three APPS. Our APPS (Nurse Practitioners and Physician Assistants) work with your physician to ensure care continuity for you. They are fully qualified to address your health concerns and develop a treatment plan. They communicate directly with your gastroenterologist to care for you. Seeing the Advanced Practice Practitioners on your physician's team can help you by facilitating care more  promptly, often allowing for earlier appointments, access to diagnostic testing, procedures, and other specialty referrals.

## 2024-09-19 NOTE — Telephone Encounter (Signed)
Thanks, I let her know

## 2024-09-19 NOTE — Progress Notes (Signed)
 Holley Mare returns for additional hemorrhoidal banding, visit #3  Symptoms prior to banding: prolapse and bleeding Symptoms have improved dramatically thus far with band ligation  She has also stopped her CellCept  and has had improvement with nausea, headache, lower extremity edema and loose stools  She is taking a half of the loperamide daily in the morning because of stool urgency in the morning only   PROCEDURE NOTE:  The patient presents with symptomatic grade 2 internal hemorrhoids, requesting rubber band ligation of his her hemorrhoidal disease.  All risks, benefits and alternative forms of therapy were described and informed consent was obtained.   The anorectum was pre-medicated with 0.125% nitroglycerin  ointment The decision was made to band the RA (RP and LL banded previously) internal hemorrhoid, and the [facility] System was used to perform band ligation without complication.   Digital anorectal examination was then performed to assure proper positioning of the band, and to adjust the banded tissue as required.  The patient was discharged home without pain or other issues.  Dietary and behavioral recommendations were given and along with follow-up instructions.     The patient will return as scheduled for follow-up and possible additional banding as required. No complications were encountered and the patient tolerated the procedure well.

## 2024-09-20 ENCOUNTER — Other Ambulatory Visit: Payer: Self-pay

## 2024-09-21 ENCOUNTER — Other Ambulatory Visit (INDEPENDENT_AMBULATORY_CARE_PROVIDER_SITE_OTHER)

## 2024-09-21 ENCOUNTER — Ambulatory Visit: Payer: Self-pay | Admitting: Internal Medicine

## 2024-09-21 DIAGNOSIS — Z79899 Other long term (current) drug therapy: Secondary | ICD-10-CM

## 2024-09-21 DIAGNOSIS — D72829 Elevated white blood cell count, unspecified: Secondary | ICD-10-CM

## 2024-09-21 DIAGNOSIS — Z8719 Personal history of other diseases of the digestive system: Secondary | ICD-10-CM

## 2024-09-21 DIAGNOSIS — J679 Hypersensitivity pneumonitis due to unspecified organic dust: Secondary | ICD-10-CM

## 2024-09-21 DIAGNOSIS — Z5181 Encounter for therapeutic drug level monitoring: Secondary | ICD-10-CM

## 2024-09-21 DIAGNOSIS — J849 Interstitial pulmonary disease, unspecified: Secondary | ICD-10-CM | POA: Diagnosis not present

## 2024-09-21 DIAGNOSIS — J45909 Unspecified asthma, uncomplicated: Secondary | ICD-10-CM

## 2024-09-21 LAB — CBC WITH DIFFERENTIAL/PLATELET
Basophils Absolute: 0.1 K/uL (ref 0.0–0.1)
Basophils Relative: 0.3 % (ref 0.0–3.0)
Eosinophils Absolute: 0.4 K/uL (ref 0.0–0.7)
Eosinophils Relative: 1.9 % (ref 0.0–5.0)
HCT: 39 % (ref 36.0–46.0)
Hemoglobin: 12.2 g/dL (ref 12.0–15.0)
Lymphocytes Relative: 16.6 % (ref 12.0–46.0)
Lymphs Abs: 3.4 K/uL (ref 0.7–4.0)
MCHC: 31.2 g/dL (ref 30.0–36.0)
MCV: 87 fl (ref 78.0–100.0)
Monocytes Absolute: 1.4 K/uL — ABNORMAL HIGH (ref 0.1–1.0)
Monocytes Relative: 7.1 % (ref 3.0–12.0)
Neutro Abs: 15.1 K/uL — ABNORMAL HIGH (ref 1.4–7.7)
Neutrophils Relative %: 74.1 % (ref 43.0–77.0)
Platelets: 279 K/uL (ref 150.0–400.0)
RBC: 4.48 Mil/uL (ref 3.87–5.11)
RDW: 17.7 % — ABNORMAL HIGH (ref 11.5–15.5)
WBC: 20.4 K/uL (ref 4.0–10.5)

## 2024-09-21 LAB — COMPREHENSIVE METABOLIC PANEL WITH GFR
ALT: 22 U/L (ref 0–35)
AST: 20 U/L (ref 0–37)
Albumin: 3.8 g/dL (ref 3.5–5.2)
Alkaline Phosphatase: 57 U/L (ref 39–117)
BUN: 27 mg/dL — ABNORMAL HIGH (ref 6–23)
CO2: 26 meq/L (ref 19–32)
Calcium: 9.3 mg/dL (ref 8.4–10.5)
Chloride: 103 meq/L (ref 96–112)
Creatinine, Ser: 1.14 mg/dL (ref 0.40–1.20)
GFR: 47.08 mL/min — ABNORMAL LOW (ref >60.00)
Glucose, Bld: 158 mg/dL — ABNORMAL HIGH (ref 70–99)
Potassium: 3.9 meq/L (ref 3.5–5.1)
Sodium: 140 meq/L (ref 135–145)
Total Bilirubin: 0.4 mg/dL (ref 0.2–1.2)
Total Protein: 6.1 g/dL (ref 6.0–8.3)

## 2024-09-21 NOTE — Telephone Encounter (Signed)
 Harlene for [employer] calling to report a critical lab for Dr Geronimo pt. The rest of the CBC result should be released shortly after this call.  LAB  -WBC 20.4  CAL notified and RN unavailable- advised to send HP.

## 2024-09-21 NOTE — Telephone Encounter (Signed)
Critical labs

## 2024-09-22 ENCOUNTER — Other Ambulatory Visit: Payer: Self-pay | Admitting: *Deleted

## 2024-09-22 DIAGNOSIS — D72829 Elevated white blood cell count, unspecified: Secondary | ICD-10-CM

## 2024-09-22 NOTE — Telephone Encounter (Signed)
No call was made to this patient.

## 2024-09-22 NOTE — Telephone Encounter (Signed)
 I called and spoke with patient, advised of results/recommendations per Dr. Geronimo.  She verbalized understanding.  She asked that I get a message to him regarding the Dupixent , she states he had asked her if she thought it was helping.  She said that she does still have wheezing and read that Dupixent  can cause elevated WBC count.  She is requesting to go off of the Dupixent  for a month and see if that makes a difference.  I advised her that I would get a message to him and once we hear back from him we will call her back with his recommendations.  She verbalized understanding.    Dr. Geronimo, Patient states she is having wheezing despite using the Dupixent .  She also read that Dupixent  can cause and elevated WBC level.  She is requesting going off of the Dupixent  for a month and see if that makes a difference in her WBC count.  Please advise.  Thank you.

## 2024-09-22 NOTE — Telephone Encounter (Signed)
 WBC always elevaed  Plan Pls refer to hematology if not done already; elet her know

## 2024-09-22 NOTE — Telephone Encounter (Signed)
 Ok to stop dupxient nd se in 4 weeks where we are with wbc and wheezing. Ie. Skip next 2 doses

## 2024-09-22 NOTE — Telephone Encounter (Signed)
 I called and spoke with patient, advised her of recommendations per Dr. Geronimo.  She verbalized understanding.   Nothing further needed.

## 2024-09-26 ENCOUNTER — Encounter: Payer: Self-pay | Admitting: Internal Medicine

## 2024-09-27 ENCOUNTER — Encounter: Payer: Self-pay | Admitting: Internal Medicine

## 2024-09-27 NOTE — Telephone Encounter (Signed)
 Fax sent to [facility] Transplant team [phone], first attempt, no answer.  Second attempt busy.  Fax sent to [facility] Transplant team [phone] (LOV notes, most recent labs, vaccinations record, CT scan reports and most recent PFT report).  Received a fax stating that the line was busy, fax was not sent.

## 2024-09-28 ENCOUNTER — Encounter: Payer: Self-pay | Admitting: Internal Medicine

## 2024-10-01 ENCOUNTER — Other Ambulatory Visit: Payer: Self-pay | Admitting: Internal Medicine

## 2024-10-02 ENCOUNTER — Encounter: Payer: Self-pay | Admitting: Internal Medicine

## 2024-10-04 ENCOUNTER — Encounter: Payer: Self-pay | Admitting: Internal Medicine

## 2024-10-04 DIAGNOSIS — J849 Interstitial pulmonary disease, unspecified: Secondary | ICD-10-CM

## 2024-10-04 DIAGNOSIS — Z01818 Encounter for other preprocedural examination: Secondary | ICD-10-CM

## 2024-10-04 DIAGNOSIS — N189 Chronic kidney disease, unspecified: Secondary | ICD-10-CM

## 2024-10-04 DIAGNOSIS — D72829 Elevated white blood cell count, unspecified: Secondary | ICD-10-CM

## 2024-10-05 ENCOUNTER — Encounter (HOSPITAL_BASED_OUTPATIENT_CLINIC_OR_DEPARTMENT_OTHER): Payer: Self-pay

## 2024-10-05 NOTE — Telephone Encounter (Signed)
 Dr. Geronimo, Please see message from patient regarding request for a referral to Nephrologist.  Thank you.

## 2024-10-05 NOTE — Telephone Encounter (Signed)
 Please advise on Lasix  dose and refill

## 2024-10-05 NOTE — Telephone Encounter (Signed)
 Had pfts done through transplant center on 11/10 they are in care everywhere are those sufficient?

## 2024-10-08 NOTE — Telephone Encounter (Signed)
 Ordes done

## 2024-10-08 NOTE — Telephone Encounter (Signed)
 I have been off all week . Patient sent this to Candis Dandy but the messsage was sent to me instead. Not sure why.   Plan  - Candis if you can fill her Lasix  appreciated when you see her 10/09/24   - when you see her tomorrow - pls send me message on her PFT and overall sttus  - will need to see how her side effct and wheezing is without dupixent  (but let her know at [facility] summitt lot of ILD doctors are doing this for cough/wheezing associated with HP esp when eos high/allergy test +ve)  - she will nneed to continue Rituxan  per schedule +  ? Jan 2026  - Jascayd NOT approved for PPF as yet  - recommend Tyvaso (via [place] study) - positive data in [place] for IPF - so this is immediate next bet -> if side effect she can bail out    - please walk her on room air (turn off o2 at rest for 5 min) and record distance to desaturation

## 2024-10-08 NOTE — Telephone Encounter (Signed)
 CANCEL PFT

## 2024-10-09 ENCOUNTER — Encounter (HOSPITAL_BASED_OUTPATIENT_CLINIC_OR_DEPARTMENT_OTHER): Payer: Self-pay

## 2024-10-09 ENCOUNTER — Ambulatory Visit (HOSPITAL_BASED_OUTPATIENT_CLINIC_OR_DEPARTMENT_OTHER)

## 2024-10-09 ENCOUNTER — Ambulatory Visit (INDEPENDENT_AMBULATORY_CARE_PROVIDER_SITE_OTHER)

## 2024-10-09 VITALS — BP 133/68 | HR 71

## 2024-10-09 DIAGNOSIS — J849 Interstitial pulmonary disease, unspecified: Secondary | ICD-10-CM

## 2024-10-09 DIAGNOSIS — J679 Hypersensitivity pneumonitis due to unspecified organic dust: Secondary | ICD-10-CM

## 2024-10-09 DIAGNOSIS — J4530 Mild persistent asthma, uncomplicated: Secondary | ICD-10-CM | POA: Diagnosis not present

## 2024-10-09 DIAGNOSIS — Z8719 Personal history of other diseases of the digestive system: Secondary | ICD-10-CM

## 2024-10-09 DIAGNOSIS — Z79899 Other long term (current) drug therapy: Secondary | ICD-10-CM

## 2024-10-09 DIAGNOSIS — Z23 Encounter for immunization: Secondary | ICD-10-CM

## 2024-10-09 DIAGNOSIS — J9611 Chronic respiratory failure with hypoxia: Secondary | ICD-10-CM

## 2024-10-09 DIAGNOSIS — D72829 Elevated white blood cell count, unspecified: Secondary | ICD-10-CM

## 2024-10-09 DIAGNOSIS — J45909 Unspecified asthma, uncomplicated: Secondary | ICD-10-CM

## 2024-10-09 DIAGNOSIS — Z5181 Encounter for therapeutic drug level monitoring: Secondary | ICD-10-CM

## 2024-10-09 DIAGNOSIS — N189 Chronic kidney disease, unspecified: Secondary | ICD-10-CM

## 2024-10-09 LAB — PULMONARY FUNCTION TEST
DL/VA % pred: 72 %
DL/VA: 3.06 ml/min/mmHg/L
DLCO cor % pred: 53 %
DLCO cor: 8.88 ml/min/mmHg
DLCO unc % pred: 51 %
DLCO unc: 8.53 ml/min/mmHg
FEF 25-75 Pre: 2.42 L/s
FEF2575-%Pred-Pre: 168 %
FEV1-%Pred-Pre: 68 %
FEV1-Pre: 1.19 L
FEV1FVC-%Pred-Pre: 125 %
FEV6-%Pred-Pre: 57 %
FEV6-Pre: 1.27 L
FEV6FVC-%Pred-Pre: 105 %
FVC-%Pred-Pre: 54 %
FVC-Pre: 1.27 L
Pre FEV1/FVC ratio: 94 %
Pre FEV6/FVC Ratio: 100 %

## 2024-10-09 MED ORDER — FUROSEMIDE 20 MG PO TABS
20.0000 mg | ORAL_TABLET | Freq: Every day | ORAL | 0 refills | Status: AC
Start: 2024-10-09 — End: ?

## 2024-10-09 NOTE — Progress Notes (Signed)
Spiro/DLCO performed today. 

## 2024-10-09 NOTE — Patient Instructions (Signed)
Spiro/DLCO performed today. 

## 2024-10-09 NOTE — Progress Notes (Signed)
 @Patient  ID: Rhonda [name], female    DOB: November 09, 1951, 73 y.o.   MRN: [id number]  Chief Complaint  Patient presents with   Follow-up    PFT     Referring provider: Burney Darice CROME, MD  HPI: Discussed the use of AI scribe software for clinical note transcription with the patient, who gave verbal consent to proceed.  History of Present Illness Rhonda [name] is a 73 year old female with end-stage lung disease who presents for follow-up regarding lung transplant evaluation.  She is undergoing evaluation for a lung transplant at [facility], with concerns about kidney function, weight, liver function, and medication management, particularly with CellCept  tolerance as she has experienced issues with it in the past.  Her diabetes is well-managed with insulin  and metformin , but she experiences extreme fatigue about an hour after taking her morning medications, which include Breo, fluticasone , diltiazem , metoprolol , Eliquis , dicyclomine , prednisone , Bactrim , chlorpheniramine, and Restasis. She suspects metformin  or metoprolol  may contribute to her fatigue.  She has osteoporosis, diagnosed after a full body scan post-hospitalization, and is considering Fosamax  as a treatment. She has not started any infusions for osteoporosis yet.  She experiences significant gastrointestinal issues, including 8-10 stools per day, managed with half an Imodium daily. She has a history of irritable bowel syndrome and is currently on Prilosec  for GERD. She reports stomach pain after eating, attributed to reflux.  She is on 40 mg of prednisone  daily, causing swelling in her feet and knees, managed with Lasix  as needed. Her white blood cell count remains elevated at 19. She has been off Dupixent  for two weeks to see if it affects her white blood cell count.  She reports difficulty breathing when outside, requiring higher oxygen  flow than her current portable concentrator provides. She feels stable and  energetic indoors but struggles with mobility outdoors due to her oxygen  needs.   Last OV 09/14/2024: Rhonda [name] 73 y.o. -   #Interstitial lung disease and chronic respiratory failure and immunosuppression   Rhonda [name] is a 73 year old female with pulmonary fibrosis who presents with worsening oxygenation and medication side effects.   She experiences worsening oxygenation issues, with stable levels while sitting or lying down, but significant drops during movement. Her portable oxygen  concentrator, set at four liters, is insufficient during exertion, such as walking approximately 125 feet, with levels dropping to 81-84%. At home, a larger machine maintains her oxygen  levels, but the portable unit is inadequate for outdoor use *especially pulsed use.   She has a history of pulmonary fibrosis and has been on prednisone  therapy. She started on a regular dose of 30 mg of prednisone  on July 21, 2024, after increased breathlessness and the need for supplemental oxygen  when her dose was reduced to 20 mg. Over a decae, her prednisone  requirement has increased from 5 mg to 40 mg to maintain adequate oxygenation.  Recently had tried having alternate between 20 and 30 mg of prednisone  as part of taper but this made her sugars fluctuate and so she put her back on 30 mg.  Even at 30 mg not able to maintain oxygenation.  She said at 40 mg she was able to maintain oxygenation.      She has also been on Rituxan  since July 2025.   She recently stopped taking CellCept  *late September 2025 due to adverse effects, including stomach pain, nausea, headaches, and leg swelling.  Also included ER visit for acute pancreatitis.  Since discontinuing CellCept , she  feels significantly better, with the resolution of these symptoms and improved leg swelling. However, she occasionally experiences diarrhea.   She has upcoming lung transplant evaluation appointment at [facility].  She is worried about  the disadvantages of lung transplant such as significant adverse drug reactions.  I have asked her to discuss this with the transplant team.   We discussed the new antifibrotic Nerandomilast and she is worried about the GI side effects but at this point in time is standard of care and approved.  We took a shared decision making for her to take this.   #Other issues - #Asthma/cough   She is currently taking Dupixent , which has helped alleviate her wheezing and cough, especially after adding a humidifier to her oxygen  concentrator. The Dupixent  injections are painful, but they have improved her respiratory symptoms.   # Nail changes She has noticed changes in her nails, specifically the development of Beau's lines and possible clubbing, which I associated  with her lung disease. She is concerned about her high white blood cell count, which has been elevated for some time, though she no longer experiences fever or night sweats, which she had during a previous episode of pancreatitis.   #Mobility - Because of increasing oxygen  needs she feels she needs a scooter - Also wrote [employer] prescription for emergency oxygen  electricity restoration.   # Edema and pancreatitis - Resolved after stopping CellCept .    #Hyperglycemia - She is now registered with Dr. Tawni Marking   #Leukocytosis - She has been running a high white count since 2020 around 15,000 but the latest shows it is now 21,800.  Cause is not known.  She does have some night sweats but they have resolved.  There is no abdominal pain and she is actually feeling good other than her worsening sh  TEST/EVENTS :  08/14/2024: CellCept  stopped due to pancreatitis.  Allergies  Allergen Reactions   Ozempic (0.25 Or 0.5 Mg-Dose) [Semaglutide(0.25 Or 0.5mg -Dos)] Other (See Comments)    Paralyzed the stomach   Tape Other (See Comments)    SKIN IS VERY FRAGILE!! Burns and pulls up the skin!! SKIN BRUISES and TEARS EASILY.SABRA    Atorvastatin Other (See Comments)    Leg pain   Betadine [Povidone Iodine] Other (See Comments)    Blisters    Codeine Nausea And Vomiting   Garlic Diarrhea   Hydrocodone  Nausea And Vomiting   Iodine Other (See Comments)    Blisters    Macrobid [Nitrofurantoin] Other (See Comments)    Instructed by dr not to take    Ofev  [Nintedanib] Other (See Comments)    Abdominal pain   Onion Diarrhea   Rosuvastatin Other (See Comments)    Leg pain   Shellfish Allergy Nausea And Vomiting   Statins Other (See Comments)    Leg pain, but Pravachol  is tolerated   Sulfa  Antibiotics Other (See Comments)    Headaches     Immunization History  Administered Date(s) Administered   Fluad Quad(high Dose 65+) 07/17/2019, 09/04/2022   Fluad Trivalent(High Dose 65+) 08/29/2023   Hepatitis A 05/23/2010   Hepatitis B 06/23/2006   INFLUENZA, HIGH DOSE SEASONAL PF 08/24/2016, 07/24/2017, 08/31/2018, 08/15/2020, 09/26/2020, 09/12/2021, 08/24/2022, 09/14/2024   Influenza Split 08/11/2012, 08/14/2017   Influenza Whole 09/08/2011   Influenza,inj,Quad PF,6+ Mos 09/07/2013   Influenza-Unspecified 09/26/2014, 07/27/2015, 08/24/2023   MMR 05/23/2010   Moderna Covid-19 Vaccine Bivalent Booster 57yrs & up 07/30/2021   Moderna Sars-Covid-2 Vaccination 05/07/2021, 07/30/2021, 01/01/2022   PFIZER(Purple Top)SARS-COV-2  Vaccination 12/16/2019, 01/08/2020, 07/18/2020, 09/26/2020, 08/11/2022   PNEUMOCOCCAL CONJUGATE-20 10/09/2024   Pfizer(Comirnaty)Fall Seasonal Vaccine 12 years and older 08/11/2022, 07/24/2023   Pneumococcal Conjugate-13 03/15/2014   Pneumococcal Polysaccharide-23 01/11/2018, 09/26/2020, 08/26/2021   Respiratory Syncytial Virus Vaccine,Recomb Aduvanted(Arexvy) 07/28/2022   Td 02/22/2003   Tdap 10/28/2012   Zoster Recombinant(Shingrix) 02/15/2017, 05/17/2017   Zoster, Live 12/04/2010    Past Medical History:  Diagnosis Date   Allergy    Arthritis    history spinal stenosis. osteoarthritis  right hip   Asthma    Cataract    Coronary artery calcification seen on CAT scan 08/19/2017   >300 on CT scan 08/2017   DDD (degenerative disc disease), thoracic    Depression    DOE (dyspnea on exertion)    a. 04/2010 Lexi MV EF 71%, no ischemia/infarct;     Fatty liver    GERD (gastroesophageal reflux disease)    H/O steroid therapy    Steroid use orally over 4 yrs- for Lung Fibrosis   Helicobacter pylori ab+    Hemorrhoids    Hiatal hernia    High cholesterol    History of chronic bronchitis    as child   History of migraine    History of MRSA infection    Hyperlipidemia, mixed 08/19/2017   Hyperplastic colon polyp 2007   IBS (irritable bowel syndrome)    Inguinal hernia    right   Insulin  resistance    past   Interstitial lung disease (HCC)    Leukocytosis 2025   MVP (mitral valve prolapse)    Posterior mitral valve leaflet with mild MR   Pancreatitis 2025   Pneumonia    Pneumonitis, hypersensitivity (HCC)    a. 09/2012 s/p Bx - ? 2/2 bird, mold, oil paint exposure ->on steroids, followed by pulm.   PONV (postoperative nausea and vomiting)    Pre-diabetes    takes Metformin    Pulmonary embolism (HCC)    Pulmonary fibrosis (HCC)    Dr. Geronimo follows- stable at present   PVC (premature ventricular contraction) 08/19/2017   Rapid heart rate    Dr. Shlomo follows- last visit Epic note 9'16   Squamous cell carcinoma of skin    Tinnitus, right ear    Vocal cord ulcer     Tobacco History: Social History   Tobacco Use  Smoking Status Never  Smokeless Tobacco Never  Tobacco Comments   pt states she experimented in college   Counseling given: Not Answered Tobacco comments: pt states she experimented in college   Outpatient Medications Prior to Visit  Medication Sig Dispense Refill   Acetaminophen  (TYLENOL  EXTRA STRENGTH PO) Take by mouth as needed.     albuterol  (PROVENTIL ) (2.5 MG/3ML) 0.083% nebulizer solution Take 3 mLs (2.5 mg total) by nebulization  every 6 (six) hours as needed for wheezing or shortness of breath. 360 mL 0   albuterol  (VENTOLIN  HFA) 108 (90 Base) MCG/ACT inhaler Inhale 2 puffs into the lungs every 6 (six) hours as needed for wheezing or shortness of breath. 17 each 6   Alpha-D-Galactosidase (BEANO PO) Take 1 tablet by mouth as needed (as directed, if eating onion or garlic).     apixaban  (ELIQUIS ) 5 MG TABS tablet Take 1 tablet (5 mg total) by mouth 2 (two) times daily. 60 tablet 5   ascorbic acid (VITAMIN C) 500 MG tablet Take 500 mg by mouth daily.     ASHWAGANDHA PO Take 1 capsule by mouth every other day.  azelastine (ASTELIN) 0.1 % nasal spray Place 1 spray into both nostrils in the morning. Use in each nostril as directed     Calcium  Carb-Cholecalciferol (CALCIUM  500 + D PO) Take 1 tablet by mouth daily.     chlorpheniramine (CHLOR-TRIMETON) 4 MG tablet Take 2 mg by mouth 2 (two) times daily as needed for allergies or rhinitis.     cholecalciferol (VITAMIN D3) 25 MCG (1000 UNIT) tablet Take 1,000 Units by mouth every other day.     Continuous Glucose Sensor (DEXCOM G7 SENSOR) MISC Check blood sugar fasting and before meals and at bedtime. 4 each 0   cycloSPORINE (RESTASIS) 0.05 % ophthalmic emulsion Place 1 drop into both eyes 2 (two) times daily.     diltiazem  (CARDIZEM  CD) 120 MG 24 hr capsule Take 1 capsule (120 mg total) by mouth daily. 90 capsule 3   Dupilumab  (DUPIXENT ) 300 MG/2ML SOAJ Inject 300 mg into the skin every 14 (fourteen) days. 4 mL 2   EPINEPHrine  0.3 mg/0.3 mL IJ SOAJ injection Inject 0.3 mg into the muscle as needed for anaphylaxis.     ezetimibe  (ZETIA ) 10 MG tablet Take 1 tablet (10 mg total) by mouth daily. 90 tablet 3   fluticasone  (FLONASE ) 50 MCG/ACT nasal spray Spray 2 sprays into each nostril every day. 48 g 12   Glucagon  (BAQSIMI  ONE PACK) 3 MG/DOSE POWD Place 1 Device into the nose as needed (Low blood sugar with impaired consciousness). 2 each 3   HUMALOG  KWIKPEN 100 UNIT/ML KwikPen  Inject under skin up to 40 units daily 30 mL 3   Hypromellose (NATURES TEARS OP) Place 1 drop into both eyes in the morning and at bedtime.     Insulin  NPH, Human,, Isophane, (HUMULIN N KWIKPEN) 100 UNIT/ML Kiwkpen Inject 24 Units into the skin daily before breakfast. 15 mL 3   levocetirizine (XYZAL ) 5 MG tablet Take 5 mg by mouth at bedtime.     Loperamide HCl (IMODIUM PO) Take 0.5 tablets by mouth daily.     metFORMIN  (GLUCOPHAGE -XR) 500 MG 24 hr tablet Take 1 tablet (500 mg total) by mouth 2 (two) times daily with a meal. 180 tablet 3   metoprolol  succinate (TOPROL -XL) 25 MG 24 hr tablet Take 1 tablet (25 mg total) by mouth daily. 90 tablet 3   montelukast  (SINGULAIR ) 10 MG tablet TAKE 1 TABLET BY MOUTH EVERYDAY AT BEDTIME 90 tablet 2   Multiple Vitamin (MULTIVITAMIN) tablet Take 1 tablet by mouth daily with breakfast.     nitroGLYCERIN  (NITROGLYN) 2 % ointment Apply 0.25 inches topically 2 (two) times daily. Apply To anus and surrounding tissue. Use pea size amount 30 g 0   nitroGLYCERIN  (NITROSTAT ) 0.4 MG SL tablet Place 1 tablet (0.4 mg total) under the tongue every 5 (five) minutes as needed for chest pain. 25 tablet 10   nystatin  (MYCOSTATIN ) 100000 UNIT/ML suspension Take 5 mLs by mouth 4 (four) times daily.     nystatin  cream (MYCOSTATIN ) Apply 1 Application topically 2 (two) times daily as needed (for irritation- affected sites).     omeprazole  (PRILOSEC  OTC) 20 MG tablet Take 10 mg by mouth daily.     OXYGEN  Inhale 4-5 L/min into the lungs See admin instructions. Inhale 4-5 L/min at bedtime and as needed for shortness of breath during the day     pravastatin  (PRAVACHOL ) 40 MG tablet Take 1 tablet (40 mg total) by mouth every evening. 90 tablet 1   predniSONE  (DELTASONE ) 10 MG tablet Take 5 tablets  by mouth daily x 2 weeks and then take 4 tablets a day and stay there till further instructions 100 tablet 1   PREPARATION H 0.25-50 % GEL Place 1 application  rectally 2 (two) times daily as  needed (for hemorrhoids).     revefenacin  (YUPELRI ) 175 MCG/3ML nebulizer solution Inhale 1 vial by nebulization daily. 90 mL 5   riTUXimab -pvvr (RUXIENCE  IV) Inject into the vein. Infuse 1000mg  at Day 0 and Day 14. Repeat cycle every 6 months     sodium chloride  (OCEAN) 0.65 % SOLN nasal spray Place 1 spray into both nostrils as needed for congestion.     Sodium Chloride , Inhalant, 7 % NEBU Use per nebulizer once a day to help clear sputum 120 mL 3   sulfamethoxazole -trimethoprim  (BACTRIM  DS) 800-160 MG tablet Take 1 tablet by mouth every Monday, Wednesday, and Friday. 12 tablet 2   ALPRAZolam  (XANAX ) 0.25 MG tablet Take 0.25 mg by mouth at bedtime as needed for anxiety or sleep. (Patient not taking: Reported on 10/09/2024)     No facility-administered medications prior to visit.     Review of Systems:   Constitutional:   No  weight loss, night sweats,  Fevers, chills, fatigue, or  lassitude.  HEENT:   No headaches,  Difficulty swallowing,  Tooth/dental problems, or  Sore throat,                No sneezing, itching, ear ache, nasal congestion, post nasal drip,   CV:  No chest pain,  Orthopnea, PND, swelling in lower extremities, anasarca, dizziness, palpitations, syncope.   GI  No heartburn, indigestion, abdominal pain, nausea, vomiting, diarrhea, change in bowel habits, loss of appetite, bloody stools.   Resp: No shortness of breath with exertion or at rest.  No excess mucus, no productive cough,  No non-productive cough,  No coughing up of blood.  No change in color of mucus.  No wheezing.  No chest wall deformity  Skin: no rash or lesions.  GU: no dysuria, change in color of urine, no urgency or frequency.  No flank pain, no hematuria   MS:  No joint pain or swelling.  No decreased range of motion.  No back pain.    Physical Exam  BP 133/68   Pulse 71   SpO2 94% Comment: 3 liters  GEN: A/Ox3; pleasant , NAD, well nourished    HEENT:  [place]/AT,  EACs-clear, TMs-wnl,  NOSE-clear, THROAT-clear, no lesions, no postnasal drip or exudate noted.   NECK:  Supple w/ fair ROM; no JVD; normal carotid impulses w/o bruits; no thyromegaly or nodules palpated; no lymphadenopathy.    RESP  Clear  P & A; w/o, wheezes/ rales/ or rhonchi. no accessory muscle use, no dullness to percussion  CARD:  RRR, no m/r/g, no peripheral edema, pulses intact, no cyanosis or clubbing.  GI:   Soft & nt; nml bowel sounds; no organomegaly or masses detected.   Musco: Warm bil, no deformities or joint swelling noted.   Neuro: alert, no focal deficits noted.    Skin: Warm, no lesions or rashes    Lab Results:  CBC    Component Value Date/Time   WBC 20.4 Repeated and verified X2. (HH) 09/21/2024 0957   RBC 4.48 09/21/2024 0957   HGB 12.2 09/21/2024 0957   HCT 39.0 09/21/2024 0957   PLT 279.0 09/21/2024 0957   MCV 87.0 09/21/2024 0957   MCH 29.7 08/15/2024 1706   MCHC 31.2 09/21/2024 0957   RDW 17.7 (H)  09/21/2024 0957   LYMPHSABS 3.4 09/21/2024 0957   MONOABS 1.4 (H) 09/21/2024 0957   EOSABS 0.4 09/21/2024 0957   BASOSABS 0.1 09/21/2024 0957    BMET    Component Value Date/Time   NA 140 09/21/2024 0957   NA 142 12/08/2018 0917   K 3.9 09/21/2024 0957   CL 103 09/21/2024 0957   CO2 26 09/21/2024 0957   GLUCOSE 158 (H) 09/21/2024 0957   BUN 27 (H) 09/21/2024 0957   BUN 18 12/08/2018 0917   CREATININE 1.14 09/21/2024 0957   CREATININE 0.84 07/19/2024 0839   CALCIUM  9.3 09/21/2024 0957   GFRNONAA 55 (L) 08/15/2024 1706   GFRNONAA 66 02/26/2015 1540   GFRAA >60 10/04/2019 0216   GFRAA 77 02/26/2015 1540    BNP    Component Value Date/Time   BNP 89.7 05/22/2024 1302    ProBNP    Component Value Date/Time   PROBNP 167.0 (H) 06/14/2024 1011    Imaging: No results found.  Administration History     None          Latest Ref Rng & Units 10/09/2024    9:35 AM 06/14/2024    8:19 AM 04/05/2024    8:21 AM 10/01/2023    9:49 AM 06/04/2023   10:12 AM  03/08/2023   10:49 AM 12/09/2022   11:48 AM  PFT Results  FVC-Pre L 1.27  P 1.48  1.52  1.43  1.44  1.45  1.53   FVC-Predicted Pre % 54  P 63  64  60  60  64  66   Pre FEV1/FVC % % 94  P 89  87  92  91  93  87   FEV1-Pre L 1.19  P 1.32  1.33  1.31  1.30  1.35  1.32   FEV1-Predicted Pre % 68  P 75  76  73  73  80  77   DLCO uncorrected ml/min/mmHg 8.53  P 7.71  8.29  9.73  10.82  8.11  9.64   DLCO UNC% % 51  P 46  49  58  64  50  59   DLCO corrected ml/min/mmHg 8.88  P 7.89  8.24  9.73  10.82  7.81  9.41   DLCO COR %Predicted % 53  P 47  49  58  64  48  58   DLVA Predicted % 72  P 76  78  81  85  65  81   TLC L      3.02    TLC % Predicted %      70    RV % Predicted %      70      P Preliminary result    Lab Results  Component Value Date   NITRICOXIDE 10 01/13/2016    Walk on 10/02/2024 Resting Room Air O2 saturation by oximetry 99 % If O2 sat <89%, supplemental O2 started with 2 LPM increments to maintain resting saturation above 88% prior to testing. 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 oil pain and ? mold in house  Chronic respiratory failure with hypoxia (HCC)  Mild persistent asthma, unspecified whether complicated  Chronic kidney disease, unspecified CKD stage  Assessment and Plan Assessment & Plan Interstitial lung disease with chronic respiratory failure and hypoxia End-stage interstitial lung disease with chronic respiratory  failure and hypoxia. Recent pulmonary function tests show restrictive pattern with mild improvement. Significant dyspnea in cold, dry environments. Lung transplant evaluation considered, concerns about kidney function, weight, and medication compatibility. Awaiting transplant team recommendations. - Referred to pulmonary rehabilitation program for outpatient care. - Continue oxygen  therapy as needed. - Discussed weaning Prednisone ; she wishes to stay on her current dosing of 40mg  - Prevnar 20 given today. - Await further recommendations from the transplant team; they are meeting today and expected to have a decision by this Friday.  Edema likely related to systemic steroid use and underlying pulmonary disease Edema likely due to systemic steroid use and pulmonary disease. Swelling in feet and knee, possibly exacerbated by prednisone . Lasix  effective in past. - Continue Lasix  as needed for edema management. - Monitor for worsening edema or shortness of breath.  Type 2 diabetes mellitus with gastrointestinal symptoms possibly related to metformin  Type 2 diabetes mellitus with gastrointestinal symptoms, possibly due to metformin . Fatigue and gastrointestinal discomfort reported. Previous metformin  adjustments challenging due to side effects. - Continue to monitor blood glucose levels closely. - Follow with endocrinologist.  Chronic kidney disease under evaluation for lung transplant candidacy Concerns about kidney function affecting transplant eligibility. Awaiting further evaluation and recommendations from transplant team. - Referred to nephrology for further evaluation.  Chronic tachycardia managed with beta-blocker therapy Chronic tachycardia managed with metoprolol . Fatigue potentially related to metoprolol . Discussion about alternative medications. - Follow with Cardiology regarding other potential treatment options  Leukocytosis in the setting of chronic steroid use and recent medication  changes Leukocytosis with WBC count elevated to 19.2, likely due to chronic steroid use and recent medication changes. Dupixent  held to assess impact on WBC count. - Ordered repeat CBC in one week to monitor WBC count. - Will consider hematology referral if leukocytosis persists.    Return for as scheduled with Dr. Geronimo for January 2026.  Candis Dandy, PA-C 10/09/2024

## 2024-10-09 NOTE — Patient Instructions (Signed)
 Referrals placed to Nephrology (kidney) and pulmonary rehab.  Prevnar 20 given today.  Complete CBC in one week.

## 2024-10-11 ENCOUNTER — Other Ambulatory Visit: Payer: Self-pay

## 2024-10-11 ENCOUNTER — Other Ambulatory Visit (HOSPITAL_COMMUNITY): Payer: Self-pay

## 2024-10-16 ENCOUNTER — Other Ambulatory Visit: Payer: Self-pay

## 2024-10-16 ENCOUNTER — Telehealth (HOSPITAL_BASED_OUTPATIENT_CLINIC_OR_DEPARTMENT_OTHER): Payer: Self-pay

## 2024-10-16 ENCOUNTER — Other Ambulatory Visit

## 2024-10-16 DIAGNOSIS — J849 Interstitial pulmonary disease, unspecified: Secondary | ICD-10-CM

## 2024-10-16 DIAGNOSIS — N189 Chronic kidney disease, unspecified: Secondary | ICD-10-CM | POA: Diagnosis not present

## 2024-10-16 LAB — CBC WITH DIFFERENTIAL/PLATELET
Basophils Absolute: 0 K/uL (ref 0.0–0.1)
Basophils Relative: 0.2 % (ref 0.0–3.0)
Eosinophils Absolute: 0.1 K/uL (ref 0.0–0.7)
Eosinophils Relative: 0.4 % (ref 0.0–5.0)
HCT: 36.5 % (ref 36.0–46.0)
Hemoglobin: 11.5 g/dL — ABNORMAL LOW (ref 12.0–15.0)
Lymphocytes Relative: 7.4 % — ABNORMAL LOW (ref 12.0–46.0)
Lymphs Abs: 1.4 K/uL (ref 0.7–4.0)
MCHC: 31.5 g/dL (ref 30.0–36.0)
MCV: 84.2 fl (ref 78.0–100.0)
Monocytes Absolute: 0.7 K/uL (ref 0.1–1.0)
Monocytes Relative: 3.6 % (ref 3.0–12.0)
Neutro Abs: 16.8 K/uL — ABNORMAL HIGH (ref 1.4–7.7)
Neutrophils Relative %: 88.4 % — ABNORMAL HIGH (ref 43.0–77.0)
Platelets: 233 K/uL (ref 150.0–400.0)
RBC: 4.33 Mil/uL (ref 3.87–5.11)
RDW: 18.7 % — ABNORMAL HIGH (ref 11.5–15.5)
WBC: 19 K/uL (ref 4.0–10.5)

## 2024-10-17 NOTE — Telephone Encounter (Signed)
 Agree- stable compared to recent labs.  She remains on high dose Prednisone  which likely contributes.

## 2024-10-26 ENCOUNTER — Encounter

## 2024-10-26 ENCOUNTER — Other Ambulatory Visit: Payer: Self-pay | Admitting: Internal Medicine

## 2024-10-26 DIAGNOSIS — J8489 Other specified interstitial pulmonary diseases: Secondary | ICD-10-CM

## 2024-10-26 DIAGNOSIS — Z006 Encounter for examination for normal comparison and control in clinical research program: Secondary | ICD-10-CM

## 2024-10-26 NOTE — Research (Signed)
 Title: A Randomized, Double-blind, Placebo-controlled, Multinational, Phase 3 Study of the Efficacy and Safety of Inhaled Treprostinil in Subjects with Progressive Pulmonary Fibrosis   Dose and Duration of Treatment: Treprostinil for Inhalation 0.6 mg/mL, or placebo (randomly assigned 1:1) over a 52 week period.  Protocol # RIN-PF-305  Sponsor: [employer] [place], [place] [address] Protocol Version: Amendment 1 Dated 07Aug2023 IB: Version 20 dated 11Jul2025 ICF: Make sure that the subject is re-consented with the updated ICF Main ICF v2 IRB APPROVED AS MODIFIED 29May2024 Lab Manual: Version 1 dated 08Sep2023  Investigator Brochure Product:  Treprostinil for Inhalation, 0.6 mg/mL  Key Features of the Drug: Treprostinil (LRX-15, 15AU81, UT-15) is a stable tricyclic benzindene analogue of the naturally occurring prostacyclin. Prostacyclin is known to lower pulmonary artery pressure, increase cardiac output without affecting heart rate, improve oxygen  transport, and potentially reverse pulmonary arterial remodeling. Treprostinil is a full agonist at the prostacyclin receptor (IP), leading to potent activation of the prostaglandin E receptor 2 (EP2) and D receptor 1. Activation of the EP2 receptor inhibits mast cell-mediated bronchoconstriction and has a range of inhibitory effects on fibroblast function that may be beneficial to IPF patients ([place] 2015, [place] 1995). In addition, activation of prostaglandin D receptor 1 has shown to decrease both inflammatory cell recruitment and collagen accumulation in the lung [place] 2010).   An inhalation solution of treprostinil, 0.6 mg/mL, delivered using an ultrasonic nebulizer, is currently FDA approved to treat pulmonary arterial hypertension (PAH); [WHO Group 1, PH-ILD; WHO Group 3]. Inhaled treprostinil is now being evaluated as primary treatment of IPF, idiopathic pulmonary fibrosis.   Key Inclusion Criteria: >= 18 years  of age Radiologic evidence of pulmonary fibrosis of > 10 % on HRCT Subject has a diagnosis of progressive pulmonary fibrosis (other than IPF) that fulfills at least 1 of the following criteria for progression within 24 months of screening despite standard treatment of ILD, as assessed by the Investigator:  Clinically significant decline in % predicted FVC based on >=10% relative decline  Marginal decline in % predicted FVC based on >=5% to <10% relative decline combined with worsening of respiratory symptoms Marginal decline in % predicted FVC based on >=5% to <10% relative decline combined with increasing extent of fibrotic changes on chest imaging Worsening of respiratory symptoms as well as increasing extent of fibrotic changes on chest imaging Subjects on pirfenidone  or nintedanib must be on a stable and optimized dose for >=90 days prior to Baseline. Concomitant use of both pirfenidone  and nintedanib is not permitted. Subjects treated with immunosuppressive agents (eg, mycophenolate , methotrexate, azathioprine, oral corticosteroids, rituximab ) need to be on treatment for at least 120 days prior to Baseline and, in the Investigator's clinical opinion, must be refractory to treatment  Key Exclusion Criteria: Primary obstructive pathology (FEV1/FVC <0.70 at Screening) Prior intolerance or significant lack of efficacy to a prostacyclin or prostacyclin analogue that resulted in discontinuation or inability to effectively titrate that therapy.  Subject has diagnosis of IPF Subject has received any PAH-approved therapy. As needed use of a PDE5-I for erectile dysfunction is permitted, provided no doses are taken within 48 hours prior to any study-related efficacy assessments Receiving >10 L/min of oxygen  at rest at Baseline.  Exacerbation of ILD or active pulmonary or upper respiratory infection within 30 days prior to Baseline.  Uncontrolled cardiac disease, defined as myocardial infarction within 6  months prior to Baseline or unstable angina within 30 days prior to Baseline. Use of any investigational drug/device or participation  in any investigational study in which the subject received a medical intervention (ie, procedure, device, medication/supplement) within 30 days prior to Screening. Subjects participating in non-interventional, observational, or registry studies are eligible.  Life expectancy <12 months due to ILD or a concomitant illness.  Acute pulmonary embolism within 90 days prior to Baseline.   Pharmacodynamics/Pharmacokinetics:  Metabolism: Substantially metabolized by the liver, primarily CYP2C8. Of [place] administered treprostinil, only 4% is excreted unchanged in urine.  Protein Binding: 91% bound to human plasma proteins Elimination: Biphasic elimination with [place] administration with terminal elimination t1/2 of approximately 4 hours  Contraindications: None  Special Warnings/Precautions:  Due to substantial hepatic metabolism, liver impairment could result in decreased metabolism and increased systemic exposure to treprostinil.  Abrupt discontinuation or marked dose reduction can lead to sudden worsening of pulmonary arterial hypertension there is no evidence to suggest patients taking inhaled therapy are at risk for withdrawal symptoms or rebound effects.  Angioedema was identified in the post-approval use of inhaled treprostinil solution. As a result of the route of administration, acute contact dermatitis mimicking angioedema may be more likely associated with administration of inhaled treprostinil solution Treprostinil inhibits platelet aggregation and increases the risk of bleeding,  Symptomatic hypotension due to systemic and pulmonary vasodilator effects of Treprostinil may occur Single doses of Tyvaso 54 mcg and 84 mcg prolonged the Qtc interval by approximately 10 ms. The QTc effect decreased rapidly as concentration of treprostinil decreased.   Drug Interactions:    CYP2C8 is a major enzyme and CYP2C9 a minor enzyme in treprostinil metabolism in humans.     Safety Data: The estimate of worldwide exposure to treprostinil in the marketed inhaled formulations is approximately 85,258,140 total patient treatment-days (approximately 40,389 patient treatment-years).  most relevant insight into the expected safety profile of inhaled treprostinil were LRX-TRIUMPH 001 and RIN-PH-201,  en. These studies suggest that the most common adverse effects of inhaled treprostinil are related to the pharmacologic actions of the drug and to the route of administration: headache (41% with active treatment vs 23% with placebo), nausea (19% with active treatment vs 11% with placebo), and flushing (15% with active treatment vs <1% with placebo) were frequently reported.  Signs and symptoms of overdose with inhaled treprostinil solution may correspond to dose-limiting pharmacologic effects, including diarrhea, flushing, headache, hypotension, nausea, and vomiting. Most events are self-limiting and resolve with reduction or withholding of inhaled treprostinil solution. Provide general supportive care until the symptoms of overdose have resolved.   Adverse Reactions: Adverse effects are listed below that occurred in at least 5-10% of inhaled treprostinil subjects in prior studies. Of note, SAE were more common in the placebo group than treprostinil group.   Cough Headache Nausea Dizziness Flushing Throat Irritation  Pharyngolaryngeal pain Diarrhea Chest pain Jaw pain Epistaxis Syncope   Serious adverse events observed post-marketing:  Vomiting Pain Pneumonia Syncope Hemoptysis Hypotension    Xxxxxxxxxxxxx [facility] [place] Clinical Research Coordinator note:   This visit for Subject Rhonda [name] with DOB: 11-16-1951 on 10/26/2024 for the above protocol is Visit/Encounter # Screen visit  and is for purpose of research.  THe Subject ID for the study is  [id number]    Rhonda [name]  [facility]) expressed continued interest and consent in continuing as a study subject. Subject confirmed that there was  NO  (NO/YES) change in contact information (e.g. address, telephone, email). Subject thanked for participation in research and contribution to science. In this visit 10/26/2024 the subject will be evaluated by Dr  Elsie Roses ( Sub-Investigator). This research coordinator has verified that the above investigator is up to date with his/her training logs.  All assessments were done as per the protocol. Consent was signed at 9:17 am. Patient was given a copy and copy for faxed to [place] management to be added to epic.  The Subject was informed that the PI Dorethia Cave continues to have oversight of the subject's visits and course through relevant discussions, reviews, and also specifically of this visit by routing of this note to the PI.    1. This visit is a key visit of screening. The PI is Not available for this visit.    2.  In addition, ahead of the key visit of Screen visit ( the visit and subject were discussed with the PI Dorethia Ny on date of 25 Oct 2024  face to face) as part of direct PI oversight.      Signed by  Mansfield Pottier  Clinical Research Coordinator  [facility]  [place], [place] Signature Time/Date: 11:58 AM 10/26/2024

## 2024-10-28 NOTE — Progress Notes (Signed)
 Rhonda [name], DOB 12-20-1950, was seen as subject in a clinical trial /Protocol # RIN-PF-305. Cardiopulmonary symptoms are reported as progression of exertional dyspnea.Other or new symptoms denied. She has been evaluated at [facility] for the Lung Transplant Registry but did not qualify. Pertinent physical findings include:minimal nasal septal erythema;diffuse rales bilaterally, both anteriorly & posteriorly;subtle nailbed clubbing & scattered minor ecchymoses.All physical findings NCS                                                                                                                                                                                                 [name] MD,SI

## 2024-10-31 ENCOUNTER — Ambulatory Visit: Admitting: Internal Medicine

## 2024-10-31 ENCOUNTER — Encounter: Payer: Self-pay | Admitting: Internal Medicine

## 2024-10-31 VITALS — BP 122/58 | HR 76 | Ht 60.0 in | Wt 154.0 lb

## 2024-10-31 DIAGNOSIS — E7800 Pure hypercholesterolemia, unspecified: Secondary | ICD-10-CM

## 2024-10-31 DIAGNOSIS — E1159 Type 2 diabetes mellitus with other circulatory complications: Secondary | ICD-10-CM

## 2024-10-31 MED ORDER — FIASP FLEXTOUCH 100 UNIT/ML ~~LOC~~ SOPN: 8.0000 [IU] | PEN_INJECTOR | Freq: Three times a day (TID) | SUBCUTANEOUS | 3 refills | Status: AC

## 2024-10-31 NOTE — Patient Instructions (Addendum)
 Please continue: - Metformin  ER 500 mg in am - NPH 24 units in am  - Humalog /Fiasp  (for FiAsp , inject at the start of the meal): - B'fast: 10 units - Lunch: 15-16 units - Dinner: 6-7 units (may need to decrease further)  Please return in 3 months.

## 2024-10-31 NOTE — Progress Notes (Signed)
 Patient ID: Rhonda [name], female   DOB: [birth year], 73 y.o.   MRN: [id number]  HPI: Rhonda [name] is a 73 y.o.-year-old femalear-old female, initially referred by her PCP, Dr. Burney, returning for follow-up for DM2, dx in 2000 (prev. IR), insulin -dependent since 12/2023, uncontrolled, with  complications (aortic and coronary atherosclerosis).  Patient was previously seen by Dr. Dartha but transferred to me 1.5 months ago. She is here with her husband.    Patient has a history of ILD (Hypersensitivity pneumonitis) and is on prednisone  and Dupixent .  She has been on Prednisone  x 12 years.  She mentions this is the only medication that works for her - not able to decrease the dose <30 mg daily in last year. Now on 40 mg daily.  Also on Oxygen . She is not a candidate for a lung transplant. She will start a new drug (inhaled), Tyveso in 11/2024 as part of an RCT.  Interim history: No increased urination, blurry vision. She has SOB and had to increase the dose of steroid from 30 mg to 40 mg daily now as mentioned above.  Reviewed HbA1c: 10/02/2024: HbA1c 6.8% Lab Results  Component Value Date   HGBA1C 6.7 (A) 09/18/2024   HGBA1C 7.2 (H) 03/15/2024   HGBA1C 6.2 (H) 09/28/2019   HGBA1C 6.0 (H) 12/08/2018   HGBA1C 6.1 (H) 08/21/2015   HGBA1C 5.9 01/22/2015   HGBA1C 5.8 02/08/2014   HGBA1C 5.9 10/28/2012   HGBA1C 5.9 01/18/2012   At last visit she was on: - Metformin  ER 500 mg with b'fast - Lantus  28 units in am - Humalog  11-16-8 units 3x a day, before meals, + sliding scale (target 150, ISF 40) Patient was on an insulin  pump (Omnipod) but was taken off 02/2024 due to steroids and 2 hospitalizations.  She is not completely opposed to starting this back if needed. She was on Ozempic but developed gastroparesis.  I advised her to change to: - Metformin  ER 500 mg 2x a day >> tolerates only 1 tab in am due to diarrhea - NPH 20 units in am >> 21 units in a.m. now - Humalog : - B'fast: 9-12 >> 10  units - Lunch: 12-16 >> 17 units - Humalog  6-8 units >> 8-9 units  Pt checks her sugars more than 4 times a day with the Dexcom G7 CGM:  Prev.:  Lowest sugar was 70; she has hypoglycemia awareness at 70.  She does have Baqsimi  at home. Highest sugar was 200s >> 200s.  Glucometer:Accuchek  She is seeing nutrition.   Pt's meals are: - Breakfast: chia seed pudding + yoghurt + almond milk; Dave's bread + PB - Lunch: meat + starch + some veggies - Dinner: salmon or other meat + starch + greens - Snacks: hummus (makes her own), chickpeas  - + Mild CKD, last BUN/creatinine:  10/02/2024: 20/1.0, GFR 59, glucose 154, protein to creatinine ratio 113 (<200) Lab Results  Component Value Date   BUN 27 (H) 09/21/2024   BUN 22 09/11/2024   CREATININE 1.14 09/21/2024   CREATININE 0.97 09/11/2024   Lab Results  Component Value Date   MICRALBCREAT 13 07/19/2024  She is not on an ACE inhibitor/ARB.  - + HL; last set of lipids:  Lab Results  Component Value Date   CHOL 220 (H) 07/19/2024   HDL 94 07/19/2024   LDLCALC 102 (H) 07/19/2024   TRIG 147 07/19/2024   CHOLHDL 2.3 07/19/2024  She is on pravastatin  40 mg daily and Zetia  10 mg daily.  She does have aortic and coronary artery atherosclerosis per review of her CT scan report from 04/2024.  - last eye exam was in 07/2024. No DR reportedly. Dr. Charmayne. + Dry eyes.  - no numbness and tingling in her feet.  Last foot exam 09/18/2024.  Pt has FH of DM in daughter, father, sister.  She also has a history of HTN, MVP, history of acute PE, pulmonary hypertension, asthma, IBS, hiatal hernia, GERD, overweight, DDD, osteoporosis. Also, she was in the emergency room with acute pancreatitis 08/15/2024.  A lipase level was 149.  ROS: + See HPI  Past Medical History:  Diagnosis Date   Allergy    Arthritis    history spinal stenosis. osteoarthritis right hip   Asthma    Cataract    Coronary artery calcification seen on CAT scan  08/19/2017   >300 on CT scan 08/2017   DDD (degenerative disc disease), thoracic    Depression    DOE (dyspnea on exertion)    a. 04/2010 Lexi MV EF 71%, no ischemia/infarct;     Fatty liver    GERD (gastroesophageal reflux disease)    H/O steroid therapy    Steroid use orally over 4 yrs- for Lung Fibrosis   Helicobacter pylori ab+    Hemorrhoids    Hiatal hernia    High cholesterol    History of chronic bronchitis    as child   History of migraine    History of MRSA infection    Hyperlipidemia, mixed 08/19/2017   Hyperplastic colon polyp 2007   IBS (irritable bowel syndrome)    Inguinal hernia    right   Insulin  resistance    past   Interstitial lung disease (HCC)    Leukocytosis 2025   MVP (mitral valve prolapse)    Posterior mitral valve leaflet with mild MR   Pancreatitis 2025   Pneumonia    Pneumonitis, hypersensitivity (HCC)    a. 09/2012 s/p Bx - ? 2/2 bird, mold, oil paint exposure ->on steroids, followed by pulm.   PONV (postoperative nausea and vomiting)    Pre-diabetes    takes Metformin    Pulmonary embolism (HCC)    Pulmonary fibrosis (HCC)    Dr. Geronimo follows- stable at present   PVC (premature ventricular contraction) 08/19/2017   Rapid heart rate    Dr. Shlomo follows- last visit Epic note 9'16   Squamous cell carcinoma of skin    Tinnitus, right ear    Vocal cord ulcer    Past Surgical History:  Procedure Laterality Date   59 HOUR PH STUDY N/A 02/21/2018   Procedure: 24 HOUR PH STUDY;  Surgeon: Shila Gustav GAILS, MD;  Location: [facility];  Service: Endoscopy;  Laterality: N/A;   BREAST BIOPSY Right 2009   BIOPSY, pt denies   BREAST BIOPSY Left 2003   Benign    CATARACT EXTRACTION Left    CESAREAN SECTION     COLONOSCOPY     COLONOSCOPY N/A 05/28/2024   Procedure: COLONOSCOPY;  Surgeon: Albertus Gordy HERO, MD;  Location: [facility];  Service: Gastroenterology;  Laterality: N/A;   ESOPHAGEAL MANOMETRY N/A 02/21/2018   Procedure: ESOPHAGEAL  MANOMETRY (EM);  Surgeon: Shila Gustav GAILS, MD;  Location: [facility];  Service: Endoscopy;  Laterality: N/A;   FOOT FRACTURE SURGERY  2006 or 2007   right   HYMENECTOMY     LUNG BIOPSY  09/28/2012   Procedure: LUNG BIOPSY;  Surgeon: Elspeth JAYSON Millers, MD;  Location: [facility] OR;  Service:  Thoracic;  Laterality: N/A;  lung biopsies tims three   RIGHT HEART CATH N/A 04/04/2024   Procedure: RIGHT HEART CATH;  Surgeon: Rolan Ezra RAMAN, MD;  Location: [facility] INVASIVE CV LAB;  Service: Cardiovascular;  Laterality: N/A;   SQUAMOUS CELL CARCINOMA EXCISION Left    left arm   TOTAL HIP ARTHROPLASTY Right 09/10/2015   Procedure: RIGHT TOTAL HIP ARTHROPLASTY ANTERIOR APPROACH;  Surgeon: Donnice Car, MD;  Location: [facility];  Service: Orthopedics;  Laterality: Right;   TOTAL HIP ARTHROPLASTY Left 10/03/2019   Procedure: TOTAL HIP ARTHROPLASTY ANTERIOR APPROACH;  Surgeon: Car Donnice, MD;  Location: [facility];  Service: Orthopedics;  Laterality: Left;  70 mins   TUBAL LIGATION     UPPER GI ENDOSCOPY     VIDEO ASSISTED THORACOSCOPY  09/28/2012   Procedure: VIDEO ASSISTED THORACOSCOPY;  Surgeon: Elspeth JAYSON Millers, MD;  Location: [facility] OR;  Service: Thoracic;  Laterality: Right;   VIDEO BRONCHOSCOPY  11/19/2011   Procedure: VIDEO BRONCHOSCOPY WITH FLUORO;  Surgeon: Dorethia Cave, MD;  Location: [facility] ENDOSCOPY;  Service: Endoscopy;;   VIDEO BRONCHOSCOPY Bilateral 05/23/2024   Procedure: VIDEO BRONCHOSCOPY WITHOUT FLUORO;  Surgeon: Mannam, Praveen, MD;  Location: [facility];  Service: Cardiopulmonary;  Laterality: Bilateral;   Social History   Socioeconomic History   Marital status: Married    Spouse name: [name]   Number of children: 1   Years of education: Not on file   Highest education level: Master's degree (e.g., MA, MS, MEng, MEd, MSW, MBA)  Occupational History   Occupation: SELF EMPLOYED    Comment: careers information officer, retired   Occupation: retired    Associate Professor: [employer]   Tobacco Use   Smoking status: Never   Smokeless tobacco: Never   Tobacco comments:    pt states she experimented in Design[employer], [employer]   Vaping status: Never Used  Substance and Sexual Activity   Alcohol use: Not Currently    Alcohol/week: 2.0 standard drinks of alcohol    Types: 1 Glasses of wine, 1 Standard drinks or equivalent per week   Drug use: No   Sexual activity: Never    Birth control/protection: None    Comment: sex partners in the last 12 months 0  Other Topics Concern   Not on file  Social History Narrative   Married; doctor, general practice      Exercise - swimming daily for 20-30 minutes   Patient was started her periods at 73 year old (regular periods), and painful periods      Social Drivers of Corporate Investment Banker Strain: Not on file  Food Insecurity: No Food Insecurity (05/23/2024)   Hunger Vital Sign    Worried About Running Out of Food in the Last Year: Never true    Ran Out of Food in the Last Year: Never true  Transportation Needs: No Transportation Needs (05/23/2024)   PRAPARE - Administrator, Civil Service (Medical): No    Lack of Transportation (Non-Medical): No  Physical Activity: Not on file  Stress: Not on file  Social Connections: Socially Integrated (05/23/2024)   Social Connection and Isolation Panel    Frequency of Communication with Friends and Family: More than three times a week    Frequency of Social Gatherings with Friends and Family: More than three times a week    Attends Religious Services: 1 to 4 times per year    Active Member of [employer] or Organizations: No    Attends [employer]  or Organization Meetings: More than 4 times per year    Marital Status: Married  Catering Manager Violence: Not At Risk (05/23/2024)   Humiliation, Afraid, Rape, and Kick questionnaire    Fear of Current or Ex-Partner: No    Emotionally Abused: No    Physically Abused: No    Sexually Abused: No   Current Outpatient Medications on File Prior to  Visit  Medication Sig Dispense Refill   Acetaminophen  (TYLENOL  EXTRA STRENGTH PO) Take by mouth as needed.     albuterol  (PROVENTIL ) (2.5 MG/3ML) 0.083% nebulizer solution Take 3 mLs (2.5 mg total) by nebulization every 6 (six) hours as needed for wheezing or shortness of breath. 360 mL 0   albuterol  (VENTOLIN  HFA) 108 (90 Base) MCG/ACT inhaler Inhale 2 puffs into the lungs every 6 (six) hours as needed for wheezing or shortness of breath. 17 each 6   Alpha-D-Galactosidase (BEANO PO) Take 1 tablet by mouth as needed (as directed, if eating onion or garlic).     ALPRAZolam  (XANAX ) 0.25 MG tablet Take 0.25 mg by mouth at bedtime as needed for anxiety or sleep. (Patient not taking: Reported on 10/09/2024)     apixaban  (ELIQUIS ) 5 MG TABS tablet Take 1 tablet (5 mg total) by mouth 2 (two) times daily. 60 tablet 5   ascorbic acid (VITAMIN C) 500 MG tablet Take 500 mg by mouth daily.     ASHWAGANDHA PO Take 1 capsule by mouth every other day.     azelastine (ASTELIN) 0.1 % nasal spray Place 1 spray into both nostrils in the morning. Use in each nostril as directed     Calcium  Carb-Cholecalciferol (CALCIUM  500 + D PO) Take 1 tablet by mouth daily.     chlorpheniramine (CHLOR-TRIMETON) 4 MG tablet Take 2 mg by mouth 2 (two) times daily as needed for allergies or rhinitis.     cholecalciferol (VITAMIN D3) 25 MCG (1000 UNIT) tablet Take 1,000 Units by mouth every other day.     Continuous Glucose Sensor (DEXCOM G7 SENSOR) MISC Check blood sugar fasting and before meals and at bedtime. 4 each 0   cycloSPORINE (RESTASIS) 0.05 % ophthalmic emulsion Place 1 drop into both eyes 2 (two) times daily.     diltiazem  (CARDIZEM  CD) 120 MG 24 hr capsule Take 1 capsule (120 mg total) by mouth daily. 90 capsule 3   Dupilumab  (DUPIXENT ) 300 MG/2ML SOAJ Inject 300 mg into the skin every 14 (fourteen) days. 4 mL 2   EPINEPHrine  0.3 mg/0.3 mL IJ SOAJ injection Inject 0.3 mg into the muscle as needed for anaphylaxis.      ezetimibe  (ZETIA ) 10 MG tablet Take 1 tablet (10 mg total) by mouth daily. 90 tablet 3   fluticasone  (FLONASE ) 50 MCG/ACT nasal spray Spray 2 sprays into each nostril every day. 48 g 12   furosemide  (LASIX ) 20 MG tablet Take 1 tablet (20 mg total) by mouth daily. Use as needed for edema per office instructions 30 tablet 0   Glucagon  (BAQSIMI  ONE PACK) 3 MG/DOSE POWD Place 1 Device into the nose as needed (Low blood sugar with impaired consciousness). 2 each 3   HUMALOG  KWIKPEN 100 UNIT/ML KwikPen Inject under skin up to 40 units daily 30 mL 3   Hypromellose (NATURES TEARS OP) Place 1 drop into both eyes in the morning and at bedtime.     Insulin  NPH, Human,, Isophane, (HUMULIN  N KWIKPEN) 100 UNIT/ML Kiwkpen Inject 24 Units into the skin daily before breakfast. 15 mL 3  levocetirizine (XYZAL ) 5 MG tablet Take 5 mg by mouth at bedtime.     Loperamide HCl (IMODIUM PO) Take 0.5 tablets by mouth daily.     metFORMIN  (GLUCOPHAGE -XR) 500 MG 24 hr tablet Take 1 tablet (500 mg total) by mouth 2 (two) times daily with a meal. 180 tablet 3   metoprolol  succinate (TOPROL -XL) 25 MG 24 hr tablet Take 1 tablet (25 mg total) by mouth daily. 90 tablet 3   montelukast  (SINGULAIR ) 10 MG tablet TAKE 1 TABLET BY MOUTH EVERYDAY AT BEDTIME 90 tablet 2   Multiple Vitamin (MULTIVITAMIN) tablet Take 1 tablet by mouth daily with breakfast.     nitroGLYCERIN  (NITROGLYN) 2 % ointment Apply 0.25 inches topically 2 (two) times daily. Apply To anus and surrounding tissue. Use pea size amount 30 g 0   nitroGLYCERIN  (NITROSTAT ) 0.4 MG SL tablet Place 1 tablet (0.4 mg total) under the tongue every 5 (five) minutes as needed for chest pain. 25 tablet 10   nystatin  (MYCOSTATIN ) 100000 UNIT/ML suspension Take 5 mLs by mouth 4 (four) times daily.     nystatin  cream (MYCOSTATIN ) Apply 1 Application topically 2 (two) times daily as needed (for irritation- affected sites).     omeprazole  (PRILOSEC  OTC) 20 MG tablet Take 10 mg by mouth  daily.     OXYGEN  Inhale 4-5 L/min into the lungs See admin instructions. Inhale 4-5 L/min at bedtime and as needed for shortness of breath during the day     pravastatin  (PRAVACHOL ) 40 MG tablet Take 1 tablet (40 mg total) by mouth every evening. 90 tablet 1   predniSONE  (DELTASONE ) 10 MG tablet Take 5 tablets by mouth daily x 2 weeks and then take 4 tablets a day and stay there till further instructions 100 tablet 1   PREPARATION H 0.25-50 % GEL Place 1 application  rectally 2 (two) times daily as needed (for hemorrhoids).     revefenacin  (YUPELRI ) 175 MCG/3ML nebulizer solution Inhale 1 vial by nebulization daily. 90 mL 5   riTUXimab -pvvr (RUXIENCE  IV) Inject into the vein. Infuse 1000mg  at Day 0 and Day 14. Repeat cycle every 6 months     sodium chloride  (OCEAN) 0.65 % SOLN nasal spray Place 1 spray into both nostrils as needed for congestion.     Sodium Chloride , Inhalant, 7 % NEBU Use per nebulizer once a day to help clear sputum 120 mL 3   sulfamethoxazole -trimethoprim  (BACTRIM  DS) 800-160 MG tablet Take 1 tablet by mouth every Monday, Wednesday, and Friday. 12 tablet 2   No current facility-administered medications on file prior to visit.   Allergies  Allergen Reactions   Ozempic (0.25 Or 0.5 Mg-Dose) [Semaglutide(0.25 Or 0.5mg -Dos)] Other (See Comments)    Paralyzed the stomach   Tape Other (See Comments)    SKIN IS VERY FRAGILE!! Burns and pulls up the skin!! SKIN BRUISES and TEARS EASILY.SABRA   Atorvastatin Other (See Comments)    Leg pain   Betadine [Povidone Iodine] Other (See Comments)    Blisters    Codeine Nausea And Vomiting   Garlic Diarrhea   Hydrocodone  Nausea And Vomiting   Iodine Other (See Comments)    Blisters    Macrobid [Nitrofurantoin] Other (See Comments)    Instructed by dr not to take    Ofev  [Nintedanib] Other (See Comments)    Abdominal pain   Onion Diarrhea   Rosuvastatin Other (See Comments)    Leg pain   Shellfish Allergy Nausea And Vomiting    Statins Other (See  Comments)    Leg pain, but Pravachol  is tolerated   Sulfa  Antibiotics Other (See Comments)    Headaches    Family History  Problem Relation Age of Onset   Asthma Mother    Osteoarthritis Mother    Dementia Mother 70   Lymphoma Father    Diabetes Father    Hypertension Sister    Heart disease Sister    Kidney disease Sister    Allergic rhinitis Daughter    Ovarian cancer Maternal Aunt    Breast cancer Paternal Aunt    Breast cancer Paternal Aunt    Emphysema Maternal Grandmother    Asthma Maternal Grandmother    Lung disease Maternal Grandfather    Bone cancer Paternal Grandfather    Breast cancer Cousin    Breast cancer Cousin    Breast cancer Cousin    Colon cancer Neg Hx    Angioedema Neg Hx    Eczema Neg Hx    Immunodeficiency Neg Hx    Urticaria Neg Hx    PE: BP (!) 122/58   Pulse 76   Ht 5' (1.524 m)   Wt 154 lb (69.9 kg)   SpO2 95%   BMI 30.08 kg/m  Wt Readings from Last 3 Encounters:  10/31/24 154 lb (69.9 kg)  09/19/24 150 lb 8 oz (68.3 kg)  09/18/24 151 lb 3.2 oz (68.6 kg)   Constitutional: overweight, in NAD Eyes:  EOMI, no exophthalmos ENT: no neck masses, no cervical lymphadenopathy Cardiovascular: RRR, No MRG Respiratory: CTA B Musculoskeletal: no deformities Skin:no rashes Neurological: no tremor with outstretched hands.  ASSESSMENT: 1. DM2, insulin -dependent, uncontrolled, with  complications - Aortic and coronary atherosclerosis  2. HL  PLAN:  1. Patient with long-standing, uncontrolled diabetes, on oral antidiabetic regimen, with low-dose metformin , and also basal/bolus insulin  regimen, adjusted at last OV.  HbA1c at that time improved from 7.2% to 6.7%.  Since last visit, she had another HbA1c, slightly higher, at 6.8%, last month. - Of note, she had to have repeated courses of prednisone  for her lung condition  -taking at least 30 mg daily but may need higher doses during exacerbations.   - she was prev. On an  insulin  pump but came off earlier this year.  She would not be opposed to starting this back but at last visit I did not necessarily recommended due to multiple prednisone  courses, which could affect the blood sugars. - At that time, sugars were improving overnight, except for the times when she was eating a snack at bedtime, after which, sugars were increasing in the middle of the night.  She mentioned that she did not really want the left but she was afraid that her blood sugars will drop too low without it.  During the day, sugars were high, not unusual in the setting of prednisone  treatment. I advised her to stop the snack at night and switch from Lantus  to NPH, a 12-hour insulin  with a pharmacodynamic profile that can better mimic the hyperglycemia induced by prednisone .  To prevent hyperglycemia Advised her to increase her metformin  dose to twice daily.  We also discussed about wearing the dose of Humalog  based on the size of her meals.  She was eating mostly a plant-based diet but did have meat with lunch and dinner.  She was cooking at home all of her meals due to an allergy to garlic and onion.  She was worried about exogenous Cushing's syndrome -she had strong predisposition of fat in the morning states  she is.  Unfortunately, we discussed that this can appear with exogenous steroids.  We did discuss about improving diet and as a first step, stopping the snack at night. CGM interpretation: -At today's visit, we reviewed her CGM downloads: It appears that 82% of values are in target range (goal >70%), while 18% are higher than 180 (goal <25%), and 0% are lower than 70 (goal <4%).  The calculated average blood sugar is 144.  The projected HbA1c for the next 3 months (GMI) is 6.8%. -Reviewing the CGM trends, sugars appear to have improved since last visit, fluctuating mostly within the target range, with occasional hyperglycemic spikes in the late afternoon but drops in blood sugars after dinner.  She is  on a higher dose of prednisone  compared to last visit.  We discussed for now to increase the dose of NPH to hopefully improve the sugars in the second half of the day but we reduced the Humalog  with lunch and dinner to avoid drops in blood sugars particularly after dinner.  She tells me that Humalog  will not be covered for her but Fiasp  appears to be covered so we discussed about switching to this insulin , which has a shorter onset and she can take it at the start of her meals.  She reduced her metformin  dose since last visit due to diarrhea with the afternoon dose.  She now takes it in the morning.  Will continue this for now. - I suggested to:  Patient Instructions  Please continue: - Metformin  ER 500 mg in am - NPH 24 units in am  - Humalog /Fiasp  (for FiAsp , inject at the start of the meal): - B'fast: 10 units - Lunch: 15-16 units - Dinner: 6-7 units (may need to decrease further)  Please return in 3 months.  - advised to check sugars at different times of the day - 4x a day, rotating check times - advised for yearly eye exams >> she is UTD - return to clinic in 3 months  2. HL - Reviewed latest lipid panel: LDL above our target of less than 55, otherwise fractions at goal:  - Will continue pravastatin  40 mg daily and Zetia  10 mg daily - no SEs  Lela Fendt, MD PhD [facility] Endocrinology

## 2024-11-07 ENCOUNTER — Inpatient Hospital Stay

## 2024-11-07 ENCOUNTER — Encounter: Payer: Self-pay | Admitting: Hematology and Oncology

## 2024-11-07 ENCOUNTER — Inpatient Hospital Stay: Attending: Hematology and Oncology | Admitting: Hematology and Oncology

## 2024-11-07 VITALS — BP 149/65 | HR 81 | Temp 99.0°F | Resp 18 | Ht 60.0 in | Wt 150.4 lb

## 2024-11-07 DIAGNOSIS — Z803 Family history of malignant neoplasm of breast: Secondary | ICD-10-CM | POA: Insufficient documentation

## 2024-11-07 DIAGNOSIS — D72825 Bandemia: Secondary | ICD-10-CM | POA: Insufficient documentation

## 2024-11-07 DIAGNOSIS — Z8041 Family history of malignant neoplasm of ovary: Secondary | ICD-10-CM | POA: Insufficient documentation

## 2024-11-07 DIAGNOSIS — E1165 Type 2 diabetes mellitus with hyperglycemia: Secondary | ICD-10-CM

## 2024-11-07 DIAGNOSIS — E0965 Drug or chemical induced diabetes mellitus with hyperglycemia: Secondary | ICD-10-CM | POA: Insufficient documentation

## 2024-11-07 DIAGNOSIS — D72829 Elevated white blood cell count, unspecified: Secondary | ICD-10-CM | POA: Insufficient documentation

## 2024-11-07 DIAGNOSIS — Z79899 Other long term (current) drug therapy: Secondary | ICD-10-CM | POA: Diagnosis not present

## 2024-11-07 DIAGNOSIS — Z7952 Long term (current) use of systemic steroids: Secondary | ICD-10-CM | POA: Insufficient documentation

## 2024-11-07 DIAGNOSIS — Z7984 Long term (current) use of oral hypoglycemic drugs: Secondary | ICD-10-CM | POA: Diagnosis not present

## 2024-11-07 DIAGNOSIS — Z807 Family history of other malignant neoplasms of lymphoid, hematopoietic and related tissues: Secondary | ICD-10-CM | POA: Insufficient documentation

## 2024-11-07 DIAGNOSIS — Z794 Long term (current) use of insulin: Secondary | ICD-10-CM | POA: Insufficient documentation

## 2024-11-07 NOTE — Assessment & Plan Note (Signed)
 She has type 2 diabetes, steroid-induced and currently insulin -dependent We discussed recent blood test results The patient appears to not adhere to low-carb diet and is relatively sedentary This could be related to recent pulsed steroid doses We spent some time discussing lifestyle changes and dietary modification

## 2024-11-07 NOTE — Assessment & Plan Note (Addendum)
 She has chronic leukocytosis for many years, more than 10 years The patient has been steroid-dependent for at least 14 years to her recollection Over the past 6 months, she had intermittent pulse steroid dose due to flare of her interstitial lung disease I suspect the cause of recent worsening leukocytosis is due to pulsed steroid doses I also suspect an element of dehydration as the patient has poor oral fluid intake and poorly controlled diabetes She is also taking intermittent doses of furosemide  for leg swelling that could cause intermittent dehydration Overall, she does not need further workup

## 2024-11-07 NOTE — Progress Notes (Signed)
 [facility] CONSULT NOTE  Patient Care Team: Burney Darice CROME, MD as PCP - General (Family Medicine) Geronimo Amel, MD as Consulting Physician (Pulmonary Disease) Edith, Debby BROCKS, MD (Cardiology) Wonda Cy BROCKS, RD as Dietitian (Family Medicine) Lynwood Schilling, MD as Consulting Physician (Cardiology)   ASSESSMENT & PLAN  Leukocytosis She has chronic leukocytosis for many years, more than 10 years The patient has been steroid-dependent for at least 14 years to her recollection Over the past 6 months, she had intermittent pulse steroid dose due to flare of her interstitial lung disease I suspect the cause of recent worsening leukocytosis is due to pulsed steroid doses I also suspect an element of dehydration as the patient has poor oral fluid intake and poorly controlled diabetes She is also taking intermittent doses of furosemide  for leg swelling that could cause intermittent dehydration Overall, she does not need further workup  Diabetes mellitus, type II (HCC) She has type 2 diabetes, steroid-induced and currently insulin -dependent We discussed recent blood test results The patient appears to not adhere to low-carb diet and is relatively sedentary This could be related to recent pulsed steroid doses We spent some time discussing lifestyle changes and dietary modification  No orders of the defined types were placed in this encounter.   All questions were answered. The patient knows to call the clinic with any problems, questions or concerns. I spent 55 minutes counseling the patient face to face, counseling and review of plan of care.   Almarie Bedford, MD 11/07/2024 3:31 PM   CHIEF COMPLAINTS/PURPOSE OF CONSULTATION:  Chronic leukocytosis  HISTORY OF PRESENTING ILLNESS:  Rhonda [name] 73 y.o. female is here because of elevated WBC.  She was found to have abnormal CBC I have reviewed her blood work dated back to 12 years She had chronic leukocytosis with  predominantly bandemia Typically, her white blood cell count is in the teens In the past 6 months, it is closer to 20 According to the patient, she had intermittent flare of interstitial lung disease She is not able to get herself wean off prednisone  lower than 40 mg She had developed type 2 diabetes due to steroid treatment and has been placed on insulin  therapy and it is poorly controlled She is oxygen  dependent This year, over the past few months, she had intermittent pulsed doses of prednisone  due to acute flare of her breathing issues She denies recent infection. The last prescription antibiotics was more than 3 months ago There is not reported symptoms of sinus congestion, urinary frequency/urgency or dysuria, diarrhea, joint swelling/pain or abnormal skin rash.  She has intermittent cough but that is not new She had no prior history or diagnosis of cancer. Her age appropriate screening programs are up-to-date. She does not smoke  MEDICAL HISTORY:  Past Medical History:  Diagnosis Date   Allergy    Arthritis    history spinal stenosis. osteoarthritis right hip   Asthma    Cataract    Coronary artery calcification seen on CAT scan 08/19/2017   >300 on CT scan 08/2017   DDD (degenerative disc disease), thoracic    Depression    DOE (dyspnea on exertion)    a. 04/2010 Lexi MV EF 71%, no ischemia/infarct;     Fatty liver    GERD (gastroesophageal reflux disease)    H/O steroid therapy    Steroid use orally over 4 yrs- for Lung Fibrosis   Helicobacter pylori ab+    Hemorrhoids    Hiatal hernia  High cholesterol    History of chronic bronchitis    as child   History of migraine    History of MRSA infection    Hyperlipidemia, mixed 08/19/2017   Hyperplastic colon polyp 2007   IBS (irritable bowel syndrome)    Inguinal hernia    right   Insulin  resistance    past   Interstitial lung disease (HCC)    Leukocytosis 2025   MVP (mitral valve prolapse)    Posterior mitral  valve leaflet with mild MR   Pancreatitis 2025   Pneumonia    Pneumonitis, hypersensitivity (HCC)    a. 09/2012 s/p Bx - ? 2/2 bird, mold, oil paint exposure ->on steroids, followed by pulm.   PONV (postoperative nausea and vomiting)    Pre-diabetes    takes Metformin    Pulmonary embolism (HCC)    Pulmonary fibrosis (HCC)    Dr. Geronimo follows- stable at present   PVC (premature ventricular contraction) 08/19/2017   Rapid heart rate    Dr. Shlomo follows- last visit Epic note 9'16   Squamous cell carcinoma of skin    Tinnitus, right ear    Vocal cord ulcer     SURGICAL HISTORY: Past Surgical History:  Procedure Laterality Date   86 HOUR PH STUDY N/A 02/21/2018   Procedure: 24 HOUR PH STUDY;  Surgeon: Shila Gustav GAILS, MD;  Location: [facility];  Service: Endoscopy;  Laterality: N/A;   BREAST BIOPSY Right 2009   BIOPSY, pt denies   BREAST BIOPSY Left 2003   Benign    CATARACT EXTRACTION Left    CESAREAN SECTION     COLONOSCOPY     COLONOSCOPY N/A 05/28/2024   Procedure: COLONOSCOPY;  Surgeon: Albertus Gordy HERO, MD;  Location: [facility];  Service: Gastroenterology;  Laterality: N/A;   ESOPHAGEAL MANOMETRY N/A 02/21/2018   Procedure: ESOPHAGEAL MANOMETRY (EM);  Surgeon: Shila Gustav GAILS, MD;  Location: [facility];  Service: Endoscopy;  Laterality: N/A;   FOOT FRACTURE SURGERY  2006 or 2007   right   HYMENECTOMY     LUNG BIOPSY  09/28/2012   Procedure: LUNG BIOPSY;  Surgeon: Elspeth JAYSON Millers, MD;  Location: [facility] OR;  Service: Thoracic;  Laterality: N/A;  lung biopsies tims three   RIGHT HEART CATH N/A 04/04/2024   Procedure: RIGHT HEART CATH;  Surgeon: Rolan Ezra RAMAN, MD;  Location: [facility] INVASIVE CV LAB;  Service: Cardiovascular;  Laterality: N/A;   SQUAMOUS CELL CARCINOMA EXCISION Left    left arm   TOTAL HIP ARTHROPLASTY Right 09/10/2015   Procedure: RIGHT TOTAL HIP ARTHROPLASTY ANTERIOR APPROACH;  Surgeon: Donnice Car, MD;  Location: [facility] ORS;  Service: Orthopedics;   Laterality: Right;   TOTAL HIP ARTHROPLASTY Left 10/03/2019   Procedure: TOTAL HIP ARTHROPLASTY ANTERIOR APPROACH;  Surgeon: Car Donnice, MD;  Location: [facility];  Service: Orthopedics;  Laterality: Left;  70 mins   TUBAL LIGATION     UPPER GI ENDOSCOPY     VIDEO ASSISTED THORACOSCOPY  09/28/2012   Procedure: VIDEO ASSISTED THORACOSCOPY;  Surgeon: Elspeth JAYSON Millers, MD;  Location: [facility] OR;  Service: Thoracic;  Laterality: Right;   VIDEO BRONCHOSCOPY  11/19/2011   Procedure: VIDEO BRONCHOSCOPY WITH FLUORO;  Surgeon: Dorethia Geronimo, MD;  Location: [facility] ENDOSCOPY;  Service: Endoscopy;;   VIDEO BRONCHOSCOPY Bilateral 05/23/2024   Procedure: VIDEO BRONCHOSCOPY WITHOUT FLUORO;  Surgeon: Mannam, Praveen, MD;  Location: [facility];  Service: Cardiopulmonary;  Laterality: Bilateral;    SOCIAL HISTORY: Social History   Socioeconomic History  Marital status: Married    Spouse name: [name]   Number of children: 1   Years of education: Not on file   Highest education level: Master's degree (e.g., MA, MS, MEng, MEd, MSW, MBA)  Occupational History   Occupation: SELF EMPLOYED    Comment: careers information officer, retired   Occupation: retired    Associate Professor: [employer]  Tobacco Use   Smoking status: Never   Smokeless tobacco: Never   Tobacco comments:    pt states she experimented in Designer, Multimedia   Vaping status: Never Used  Substance and Sexual Activity   Alcohol use: Not Currently    Alcohol/week: 2.0 standard drinks of alcohol    Types: 1 Glasses of wine, 1 Standard drinks or equivalent per week   Drug use: No   Sexual activity: Never    Birth control/protection: None    Comment: sex partners in the last 12 months 0  Other Topics Concern   Not on file  Social History Narrative   Married; doctor, general practice      Exercise - swimming daily for 20-30 minutes   Patient was started her periods at 73 year old (regular periods), and painful periods      Social Drivers  of Health   Tobacco Use: Low Risk (11/07/2024)   Patient History    Smoking Tobacco Use: Never    Smokeless Tobacco Use: Never    Passive Exposure: Not on file  Financial Resource Strain: Not on file  Food Insecurity: No Food Insecurity (05/23/2024)   Epic    Worried About Programme Researcher, Broadcasting/film/video in the Last Year: Never true    Ran Out of Food in the Last Year: Never true  Transportation Needs: No Transportation Needs (05/23/2024)   Epic    Lack of Transportation (Medical): No    Lack of Transportation (Non-Medical): No  Physical Activity: Not on file  Stress: Not on file  Social Connections: Socially Integrated (05/23/2024)   Social Connection and Isolation Panel    Frequency of Communication with Friends and Family: More than three times a week    Frequency of Social Gatherings with Friends and Family: More than three times a week    Attends Religious Services: 1 to 4 times per year    Active Member of [employer] or Organizations: No    Attends Engineer, Structural: More than 4 times per year    Marital Status: Married  Catering Manager Violence: Not At Risk (05/23/2024)   Epic    Fear of Current or Ex-Partner: No    Emotionally Abused: No    Physically Abused: No    Sexually Abused: No  Depression (PHQ2-9): Low Risk (05/05/2024)   Depression (PHQ2-9)    PHQ-2 Score: 3  Alcohol Screen: Not on file  Housing: Unknown (09/24/2024)   Received from [facility]   Epic    Unable to Pay for Housing in the Last Year: Not on file    Number of Times Moved in the Last Year: Not on file    At any time in the past 12 months, were you homeless or living in a shelter (including now)?: No  Utilities: Not At Risk (05/23/2024)   Epic    Threatened with loss of utilities: No  Health Literacy: Not on file    FAMILY HISTORY: Family History  Problem Relation Age of Onset   Asthma Mother    Osteoarthritis Mother    Dementia Mother 83  Lymphoma Father    Diabetes Father     Hypertension Sister    Heart disease Sister    Kidney disease Sister    Allergic rhinitis Daughter    Ovarian cancer Maternal Aunt    Breast cancer Paternal Aunt    Breast cancer Paternal Aunt    Emphysema Maternal Grandmother    Asthma Maternal Grandmother    Lung disease Maternal Grandfather    Bone cancer Paternal Grandfather    Breast cancer Cousin    Breast cancer Cousin    Breast cancer Cousin    Colon cancer Neg Hx    Angioedema Neg Hx    Eczema Neg Hx    Immunodeficiency Neg Hx    Urticaria Neg Hx     ALLERGIES:  is allergic to ozempic (0.25 or 0.5 mg-dose) [semaglutide(0.25 or 0.5mg -dos)], tape, atorvastatin, betadine [povidone iodine], codeine, garlic, hydrocodone , iodine, macrobid [nitrofurantoin], ofev  [nintedanib], onion, rosuvastatin, shellfish allergy, statins, and sulfa  antibiotics.  MEDICATIONS:  Current Outpatient Medications  Medication Sig Dispense Refill   Acetaminophen  (TYLENOL  EXTRA STRENGTH PO) Take by mouth as needed.     albuterol  (PROVENTIL ) (2.5 MG/3ML) 0.083% nebulizer solution Take 3 mLs (2.5 mg total) by nebulization every 6 (six) hours as needed for wheezing or shortness of breath. 360 mL 0   albuterol  (VENTOLIN  HFA) 108 (90 Base) MCG/ACT inhaler Inhale 2 puffs into the lungs every 6 (six) hours as needed for wheezing or shortness of breath. 17 each 6   Alpha-D-Galactosidase (BEANO PO) Take 1 tablet by mouth as needed (as directed, if eating onion or garlic).     ALPRAZolam  (XANAX ) 0.25 MG tablet Take 0.25 mg by mouth at bedtime as needed for anxiety or sleep.     apixaban  (ELIQUIS ) 5 MG TABS tablet Take 1 tablet (5 mg total) by mouth 2 (two) times daily. 60 tablet 5   ascorbic acid (VITAMIN C) 500 MG tablet Take 500 mg by mouth daily.     ASHWAGANDHA PO Take 1 capsule by mouth every other day.     azelastine (ASTELIN) 0.1 % nasal spray Place 1 spray into both nostrils in the morning. Use in each nostril as directed     Calcium  Carb-Cholecalciferol  (CALCIUM  500 + D PO) Take 1 tablet by mouth daily.     chlorpheniramine (CHLOR-TRIMETON) 4 MG tablet Take 2 mg by mouth 2 (two) times daily as needed for allergies or rhinitis.     cholecalciferol (VITAMIN D3) 25 MCG (1000 UNIT) tablet Take 1,000 Units by mouth every other day.     Continuous Glucose Sensor (DEXCOM G7 SENSOR) MISC Check blood sugar fasting and before meals and at bedtime. 4 each 0   cycloSPORINE (RESTASIS) 0.05 % ophthalmic emulsion Place 1 drop into both eyes 2 (two) times daily.     diltiazem  (CARDIZEM  CD) 120 MG 24 hr capsule Take 1 capsule (120 mg total) by mouth daily. 90 capsule 3   Dupilumab  (DUPIXENT ) 300 MG/2ML SOAJ Inject 300 mg into the skin every 14 (fourteen) days. 4 mL 2   EPINEPHrine  0.3 mg/0.3 mL IJ SOAJ injection Inject 0.3 mg into the muscle as needed for anaphylaxis.     ezetimibe  (ZETIA ) 10 MG tablet Take 1 tablet (10 mg total) by mouth daily. 90 tablet 3   fluticasone  (FLONASE ) 50 MCG/ACT nasal spray Spray 2 sprays into each nostril every day. 48 g 12   furosemide  (LASIX ) 20 MG tablet Take 1 tablet (20 mg total) by mouth daily. Use as needed for  edema per office instructions 30 tablet 0   Glucagon  (BAQSIMI  ONE PACK) 3 MG/DOSE POWD Place 1 Device into the nose as needed (Low blood sugar with impaired consciousness). 2 each 3   Hypromellose (NATURES TEARS OP) Place 1 drop into both eyes in the morning and at bedtime.     insulin  aspart (FIASP  FLEXTOUCH) 100 UNIT/ML FlexTouch Pen Inject 8-17 Units into the skin 3 (three) times daily before meals. 30 mL 3   Insulin  NPH, Human,, Isophane, (HUMULIN  N KWIKPEN) 100 UNIT/ML Kiwkpen Inject 24 Units into the skin daily before breakfast. 15 mL 3   levocetirizine (XYZAL ) 5 MG tablet Take 5 mg by mouth at bedtime.     Loperamide HCl (IMODIUM PO) Take 0.5 tablets by mouth daily.     metFORMIN  (GLUCOPHAGE -XR) 500 MG 24 hr tablet Take 1 tablet (500 mg total) by mouth 2 (two) times daily with a meal. 180 tablet 3   metoprolol   succinate (TOPROL -XL) 25 MG 24 hr tablet Take 1 tablet (25 mg total) by mouth daily. 90 tablet 3   montelukast  (SINGULAIR ) 10 MG tablet TAKE 1 TABLET BY MOUTH EVERYDAY AT BEDTIME 90 tablet 2   Multiple Vitamin (MULTIVITAMIN) tablet Take 1 tablet by mouth daily with breakfast.     nitroGLYCERIN  (NITROGLYN) 2 % ointment Apply 0.25 inches topically 2 (two) times daily. Apply To anus and surrounding tissue. Use pea size amount 30 g 0   nitroGLYCERIN  (NITROSTAT ) 0.4 MG SL tablet Place 1 tablet (0.4 mg total) under the tongue every 5 (five) minutes as needed for chest pain. 25 tablet 10   nystatin  (MYCOSTATIN ) 100000 UNIT/ML suspension Take 5 mLs by mouth 4 (four) times daily.     nystatin  cream (MYCOSTATIN ) Apply 1 Application topically 2 (two) times daily as needed (for irritation- affected sites).     omeprazole  (PRILOSEC  OTC) 20 MG tablet Take 10 mg by mouth daily.     OXYGEN  Inhale 4-5 L/min into the lungs See admin instructions. Inhale 4-5 L/min at bedtime and as needed for shortness of breath during the day     pravastatin  (PRAVACHOL ) 40 MG tablet Take 1 tablet (40 mg total) by mouth every evening. 90 tablet 1   predniSONE  (DELTASONE ) 10 MG tablet Take 5 tablets by mouth daily x 2 weeks and then take 4 tablets a day and stay there till further instructions 100 tablet 1   PREPARATION H 0.25-50 % GEL Place 1 application  rectally 2 (two) times daily as needed (for hemorrhoids).     revefenacin  (YUPELRI ) 175 MCG/3ML nebulizer solution Inhale 1 vial by nebulization daily. 90 mL 5   riTUXimab -pvvr (RUXIENCE  IV) Inject into the vein. Infuse 1000mg  at Day 0 and Day 14. Repeat cycle every 6 months     sodium chloride  (OCEAN) 0.65 % SOLN nasal spray Place 1 spray into both nostrils as needed for congestion.     Sodium Chloride , Inhalant, 7 % NEBU Use per nebulizer once a day to help clear sputum 120 mL 3   sulfamethoxazole -trimethoprim  (BACTRIM  DS) 800-160 MG tablet Take 1 tablet by mouth every Monday,  Wednesday, and Friday. 12 tablet 2   No current facility-administered medications for this visit.    REVIEW OF SYSTEMS:   Constitutional: Denies fevers, chills or abnormal night sweats Eyes: Denies blurriness of vision, double vision or watery eyes Ears, nose, mouth, throat, and face: Denies mucositis or sore throat Respiratory: Denies cough, dyspnea or wheezes Cardiovascular: Denies palpitation, chest discomfort or lower extremity swelling Gastrointestinal:  Denies nausea, heartburn or change in bowel habits Skin: Denies abnormal skin rashes Lymphatics: Denies new lymphadenopathy or easy bruising Neurological:Denies numbness, tingling or new weaknesses Behavioral/Psych: Mood is stable, no new changes  All other systems were reviewed with the patient and are negative.  PHYSICAL EXAMINATION: ECOG PERFORMANCE STATUS: 2 - Symptomatic, <50% confined to bed  Vitals:   11/07/24 1308  BP: (!) 149/65  Pulse: 81  Resp: 18  Temp: 99 F (37.2 C)  SpO2: 98%   Filed Weights   11/07/24 1308  Weight: 150 lb 6.4 oz (68.2 kg)    GENERAL:alert, no distress and comfortable.  She appears cushingoid NEURO: no focal motor/sensory deficits  LABORATORY DATA:  I have reviewed the data as listed Recent Results (from the past 2160 hours)  CBC w/Diff     Status: Abnormal   Collection Time: 08/14/24  2:37 PM  Result Value Ref Range   WBC 13.1 (H) 4.0 - 10.5 K/uL   RBC 4.61 3.87 - 5.11 Mil/uL   Hemoglobin 13.2 12.0 - 15.0 g/dL   HCT 58.3 63.9 - 53.9 %   MCV 90.2 78.0 - 100.0 fl   MCHC 31.8 30.0 - 36.0 g/dL   RDW 82.7 (H) 88.4 - 84.4 %   Platelets 240.0 150.0 - 400.0 K/uL   Neutrophils Relative % 91.3 (H) 43.0 - 77.0 %    Comment: smear reviewedA Manual Differential was performed and is consistent with the Automated Differential.   Lymphocytes Relative 5.4 (L) 12.0 - 46.0 %   Monocytes Relative 2.9 (L) 3.0 - 12.0 %   Eosinophils Relative 0.2 0.0 - 5.0 %   Basophils Relative 0.2 0.0 - 3.0 %    Neutro Abs 12.0 (H) 1.4 - 7.7 K/uL   Lymphs Abs 0.7 0.7 - 4.0 K/uL   Monocytes Absolute 0.4 0.1 - 1.0 K/uL   Eosinophils Absolute 0.0 0.0 - 0.7 K/uL   Basophils Absolute 0.0 0.0 - 0.1 K/uL  Comp Met (CMET)     Status: Abnormal   Collection Time: 08/14/24  2:37 PM  Result Value Ref Range   Sodium 136 135 - 145 mEq/L   Potassium 4.0 3.5 - 5.1 mEq/L   Chloride 100 96 - 112 mEq/L   CO2 29 19 - 32 mEq/L   Glucose, Bld 139 (H) 70 - 99 mg/dL   BUN 29 (H) 6 - 23 mg/dL   Creatinine, Ser 8.76 (H) 0.40 - 1.20 mg/dL   Total Bilirubin 0.3 0.2 - 1.2 mg/dL   Alkaline Phosphatase 44 39 - 117 U/L   AST 17 0 - 37 U/L   ALT 23 0 - 35 U/L   Total Protein 6.0 6.0 - 8.3 g/dL   Albumin 3.8 3.5 - 5.2 g/dL   GFR 56.99 (L) >39.99 mL/min    Comment: Calculated using the CKD-EPI Creatinine Equation (2021)   Calcium  9.2 8.4 - 10.5 mg/dL  Lactic acid, plasma     Status: Abnormal   Collection Time: 08/14/24  2:37 PM  Result Value Ref Range   LACTIC ACID 2.3 (H) 0.4 - 1.8 mmol/L  Lipase     Status: Abnormal   Collection Time: 08/14/24  2:37 PM  Result Value Ref Range   Lipase 149.0 (H) 11.0 - 59.0 U/L  Magnesium      Status: None   Collection Time: 08/14/24  2:37 PM  Result Value Ref Range   Magnesium  2.0 1.5 - 2.5 mg/dL  Phosphorus     Status: None   Collection Time:  08/14/24  2:37 PM  Result Value Ref Range   Phosphorus 2.6 2.3 - 4.6 mg/dL  Comprehensive metabolic panel     Status: Abnormal   Collection Time: 08/15/24  5:06 PM  Result Value Ref Range   Sodium 135 135 - 145 mmol/L   Potassium 4.4 3.5 - 5.1 mmol/L   Chloride 101 98 - 111 mmol/L   CO2 20 (L) 22 - 32 mmol/L   Glucose, Bld 155 (H) 70 - 99 mg/dL    Comment: Glucose reference range applies only to samples taken after fasting for at least 8 hours.   BUN 28 (H) 8 - 23 mg/dL   Creatinine, Ser 8.94 (H) 0.44 - 1.00 mg/dL   Calcium  9.3 8.9 - 10.3 mg/dL   Total Protein 5.5 (L) 6.5 - 8.1 g/dL   Albumin 3.5 3.5 - 5.0 g/dL   AST 24 15 - 41  U/L   ALT 26 0 - 44 U/L   Alkaline Phosphatase 47 38 - 126 U/L   Total Bilirubin 0.3 0.0 - 1.2 mg/dL   GFR, Estimated 55 (L) >60 mL/min    Comment: (NOTE) Calculated using the CKD-EPI Creatinine Equation (2021)    Anion gap 14 5 - 15    Comment: Performed at [facility], [address], [place], [place] [address]  Lipase, blood     Status: Abnormal   Collection Time: 08/15/24  5:06 PM  Result Value Ref Range   Lipase 118 (H) 11 - 51 U/L    Comment: Performed at [facility], [address] [place], [place] [address]  CBC with Diff     Status: Abnormal   Collection Time: 08/15/24  5:06 PM  Result Value Ref Range   WBC 13.3 (H) 4.0 - 10.5 K/uL   RBC 4.21 3.87 - 5.11 MIL/uL   Hemoglobin 12.5 12.0 - 15.0 g/dL   HCT 60.4 63.9 - 53.9 %   MCV 93.8 80.0 - 100.0 fL   MCH 29.7 26.0 - 34.0 pg   MCHC 31.6 30.0 - 36.0 g/dL   RDW 83.7 (H) 88.4 - 84.4 %   Platelets 232 150 - 400 K/uL   nRBC 0.4 (H) 0.0 - 0.2 %   Neutrophils Relative % 83 %   Neutro Abs 11.0 (H) 1.7 - 7.7 K/uL   Lymphocytes Relative 6 %   Lymphs Abs 0.8 0.7 - 4.0 K/uL   Monocytes Relative 5 %   Monocytes Absolute 0.7 0.1 - 1.0 K/uL   Eosinophils Relative 0 %   Eosinophils Absolute 0.0 0.0 - 0.5 K/uL   Basophils Relative 1 %   Basophils Absolute 0.1 0.0 - 0.1 K/uL   Immature Granulocytes 5 %   Abs Immature Granulocytes 0.60 (H) 0.00 - 0.07 K/uL    Comment: Performed at [facility], [address], [place], [place] [address]  Urinalysis, Routine w reflex microscopic -Urine, Clean Catch     Status: Abnormal   Collection Time: 08/15/24  7:30 PM  Result Value Ref Range   Color, Urine YELLOW YELLOW   APPearance CLOUDY (A) CLEAR   Specific Gravity, Urine 1.020 1.005 - 1.030   pH 5.5 5.0 - 8.0   Glucose, UA NEGATIVE NEGATIVE mg/dL   Hgb urine dipstick SMALL (A) NEGATIVE   Bilirubin Urine NEGATIVE NEGATIVE   Ketones, ur NEGATIVE NEGATIVE mg/dL   Protein, ur  NEGATIVE NEGATIVE mg/dL   Nitrite NEGATIVE NEGATIVE   Leukocytes,Ua NEGATIVE NEGATIVE    Comment: Performed  at [facility], [address], [place], [place] [address]  Urinalysis, Microscopic (reflex)     Status: None   Collection Time: 08/15/24  7:30 PM  Result Value Ref Range   RBC / HPF 0-5 0 - 5 RBC/hpf   WBC, UA 0-5 0 - 5 WBC/hpf   Bacteria, UA NONE SEEN NONE SEEN   Squamous Epithelial / HPF NONE SEEN 0 - 5 /HPF   Uric Acid Crys, UA PRESENT     Comment: Performed at [facility], [address], [place], [place] [address]  Comp Met (CMET)     Status: Abnormal   Collection Time: 09/11/24 10:48 AM  Result Value Ref Range   Sodium 141 135 - 145 mEq/L   Potassium 3.7 3.5 - 5.1 mEq/L   Chloride 102 96 - 112 mEq/L   CO2 28 19 - 32 mEq/L   Glucose, Bld 122 (H) 70 - 99 mg/dL   BUN 22 6 - 23 mg/dL   Creatinine, Ser 9.02 0.40 - 1.20 mg/dL   Total Bilirubin 0.3 0.2 - 1.2 mg/dL   Alkaline Phosphatase 52 39 - 117 U/L   AST 18 0 - 37 U/L   ALT 22 0 - 35 U/L   Total Protein 5.8 (L) 6.0 - 8.3 g/dL   Albumin 3.7 3.5 - 5.2 g/dL   GFR 42.84 (L) >39.99 mL/min    Comment: Calculated using the CKD-EPI Creatinine Equation (2021)   Calcium  9.2 8.4 - 10.5 mg/dL  CBC w/Diff     Status: Abnormal   Collection Time: 09/11/24 10:48 AM  Result Value Ref Range   WBC 21.8 Repeated and verified X2. (HH) 4.0 - 10.5 K/uL   RBC 4.52 3.87 - 5.11 Mil/uL   Hemoglobin 12.4 12.0 - 15.0 g/dL   HCT 59.9 63.9 - 53.9 %   MCV 88.4 78.0 - 100.0 fl   MCHC 31.0 30.0 - 36.0 g/dL   RDW 82.4 (H) 88.4 - 84.4 %   Platelets 300.0 150.0 - 400.0 K/uL   Neutrophils Relative % 86.4 (H) 43.0 - 77.0 %   Lymphocytes Relative 9.6 (L) 12.0 - 46.0 %   Monocytes Relative 3.0 3.0 - 12.0 %   Eosinophils Relative 0.7 0.0 - 5.0 %   Basophils Relative 0.3 0.0 - 3.0 %   Neutro Abs 18.8 (H) 1.4 - 7.7 K/uL   Lymphs Abs 2.1 0.7 - 4.0 K/uL   Monocytes Absolute 0.7 0.1 - 1.0 K/uL   Eosinophils  Absolute 0.2 0.0 - 0.7 K/uL   Basophils Absolute 0.1 0.0 - 0.1 K/uL  POCT glycosylated hemoglobin (Hb A1C)     Status: Abnormal   Collection Time: 09/18/24  4:47 PM  Result Value Ref Range   Hemoglobin A1C 6.7 (A) 4.0 - 5.6 %   HbA1c POC (<> result, manual entry)     HbA1c, POC (prediabetic range)     HbA1c, POC (controlled diabetic range)    Comp Met (CMET)     Status: Abnormal   Collection Time: 09/21/24  9:57 AM  Result Value Ref Range   Sodium 140 135 - 145 mEq/L   Potassium 3.9 3.5 - 5.1 mEq/L   Chloride 103 96 - 112 mEq/L   CO2 26 19 - 32 mEq/L    Comment: Elevated LDH levels may cause falsely increased CO2 results. If LDH is >2000 U/L, a positive bias of 12% is possible.   Glucose, Bld 158 (H) 70 - 99 mg/dL   BUN 27 (  H) 6 - 23 mg/dL   Creatinine, Ser 8.85 0.40 - 1.20 mg/dL   Total Bilirubin 0.4 0.2 - 1.2 mg/dL   Alkaline Phosphatase 57 39 - 117 U/L   AST 20 0 - 37 U/L   ALT 22 0 - 35 U/L   Total Protein 6.1 6.0 - 8.3 g/dL   Albumin 3.8 3.5 - 5.2 g/dL   GFR 52.91 (L) >39.99 mL/min    Comment: Calculated using the CKD-EPI Creatinine Equation (2021)   Calcium  9.3 8.4 - 10.5 mg/dL  CBC w/Diff     Status: Abnormal   Collection Time: 09/21/24  9:57 AM  Result Value Ref Range   WBC 20.4 Repeated and verified X2. (HH) 4.0 - 10.5 K/uL   RBC 4.48 3.87 - 5.11 Mil/uL   Hemoglobin 12.2 12.0 - 15.0 g/dL   HCT 60.9 63.9 - 53.9 %   MCV 87.0 78.0 - 100.0 fl   MCHC 31.2 30.0 - 36.0 g/dL   RDW 82.2 (H) 88.4 - 84.4 %   Platelets 279.0 150.0 - 400.0 K/uL   Neutrophils Relative % 74.1 43.0 - 77.0 %   Lymphocytes Relative 16.6 12.0 - 46.0 %   Monocytes Relative 7.1 3.0 - 12.0 %   Eosinophils Relative 1.9 0.0 - 5.0 %   Basophils Relative 0.3 0.0 - 3.0 %   Neutro Abs 15.1 (H) 1.4 - 7.7 K/uL   Lymphs Abs 3.4 0.7 - 4.0 K/uL   Monocytes Absolute 1.4 (H) 0.1 - 1.0 K/uL   Eosinophils Absolute 0.4 0.0 - 0.7 K/uL   Basophils Absolute 0.1 0.0 - 0.1 K/uL  Pulmonary function test     Status:  None   Collection Time: 10/09/24  9:35 AM  Result Value Ref Range   FVC-Pre 1.27 L   FVC-%Pred-Pre 54 %   FEV1-Pre 1.19 L   FEV1-%Pred-Pre 68 %   FEV6-Pre 1.27 L   FEV6-%Pred-Pre 57 %   Pre FEV1/FVC ratio 94 %   FEV1FVC-%Pred-Pre 125 %   Pre FEV6/FVC Ratio 100 %   FEV6FVC-%Pred-Pre 105 %   FEF 25-75 Pre 2.42 L/sec   FEF2575-%Pred-Pre 168 %   DLCO unc 8.53 ml/min/mmHg   DLCO unc % pred 51 %   DLCO cor 8.88 ml/min/mmHg   DLCO cor % pred 53 %   DL/VA 6.93 ml/min/mmHg/L   DL/VA % pred 72 %  CBC with Differential/Platelet     Status: Abnormal   Collection Time: 10/16/24 11:05 AM  Result Value Ref Range   WBC 19.0 Repeated and verified X2. (HH) 4.0 - 10.5 K/uL   RBC 4.33 3.87 - 5.11 Mil/uL   Hemoglobin 11.5 (L) 12.0 - 15.0 g/dL   HCT 63.4 63.9 - 53.9 %   MCV 84.2 78.0 - 100.0 fl   MCHC 31.5 30.0 - 36.0 g/dL   RDW 81.2 (H) 88.4 - 84.4 %   Platelets 233.0 150.0 - 400.0 K/uL   Neutrophils Relative % 88.4 Repeated and verified X2. (H) 43.0 - 77.0 %    Comment: Manual smear review agrees with instrument differential.   Lymphocytes Relative 7.4 Repeated and verified X2. (L) 12.0 - 46.0 %   Monocytes Relative 3.6 3.0 - 12.0 %   Eosinophils Relative 0.4 0.0 - 5.0 %   Basophils Relative 0.2 0.0 - 3.0 %   Neutro Abs 16.8 (H) 1.4 - 7.7 K/uL   Lymphs Abs 1.4 0.7 - 4.0 K/uL   Monocytes Absolute 0.7 0.1 - 1.0 K/uL   Eosinophils Absolute 0.1 0.0 -  0.7 K/uL   Basophils Absolute 0.0 0.0 - 0.1 K/uL

## 2024-11-09 ENCOUNTER — Ambulatory Visit (HOSPITAL_BASED_OUTPATIENT_CLINIC_OR_DEPARTMENT_OTHER)
Admission: RE | Admit: 2024-11-09 | Discharge: 2024-11-09 | Payer: Self-pay | Attending: Internal Medicine | Admitting: Internal Medicine

## 2024-11-15 ENCOUNTER — Ambulatory Visit: Payer: Self-pay | Admitting: Internal Medicine

## 2024-11-15 DIAGNOSIS — Z86711 Personal history of pulmonary embolism: Secondary | ICD-10-CM

## 2024-11-15 NOTE — Progress Notes (Signed)
 Nothing new on CT

## 2024-11-20 ENCOUNTER — Other Ambulatory Visit: Payer: Self-pay | Admitting: Internal Medicine

## 2024-11-23 ENCOUNTER — Encounter: Payer: Self-pay | Admitting: Internal Medicine

## 2024-11-23 ENCOUNTER — Telehealth: Payer: Self-pay | Admitting: Pharmacy Technician

## 2024-11-23 NOTE — Telephone Encounter (Signed)
 Auth Submission: APPROVED Site of care: Site of care: [facility] Payer: [employer]IC[employer] Medication & CPT/J Code(s) submitted: RUXIENCE  [id number] Diagnosis Code: J84.9 - J6.9 Route of submission (phone, fax, portal): LATENT Phone # Fax # Auth type: Buy/Bill PB Units/visits requested: X2 DOSES Reference number: [id number] Approval from: 11/23/24 to 11/22/25

## 2024-11-24 ENCOUNTER — Other Ambulatory Visit (HOSPITAL_COMMUNITY): Payer: Self-pay

## 2024-11-24 ENCOUNTER — Telehealth: Payer: Self-pay

## 2024-11-24 MED ORDER — HUMULIN N KWIKPEN 100 UNIT/ML ~~LOC~~ SUPN: 24.0000 [IU] | PEN_INJECTOR | Freq: Every day | SUBCUTANEOUS | 3 refills | Status: DC

## 2024-11-24 MED ORDER — HUMULIN N KWIKPEN 100 UNIT/ML ~~LOC~~ SUPN
24.0000 [IU] | PEN_INJECTOR | Freq: Every day | SUBCUTANEOUS | 3 refills | Status: AC
  Filled 2024-11-24: qty 12, 50d supply, fill #0
  Filled 2024-11-24: qty 3, 12d supply, fill #0
  Filled 2024-11-27: qty 15, 62d supply, fill #1

## 2024-11-24 NOTE — Telephone Encounter (Addendum)
 Patient called and left a vm stating that her blood sugar has been low.  In her message she mixed her Humalog  and Lyumjev  together because she didn't have enough of the humalog .   Also she states that she had a low reading then she ate something then she injected her suluin after that.   Also she is going through a change in her prednisone .    Spoke with Dr. Trixie Face to Face and we corrected the patients medications over the phone and reiterated how they are supposed to be taken and also refilled her Humulin  insulin  as well.

## 2024-11-27 ENCOUNTER — Other Ambulatory Visit: Payer: Self-pay

## 2024-11-27 ENCOUNTER — Other Ambulatory Visit (HOSPITAL_COMMUNITY): Payer: Self-pay

## 2024-11-28 ENCOUNTER — Other Ambulatory Visit (HOSPITAL_COMMUNITY): Payer: Self-pay

## 2024-11-29 ENCOUNTER — Other Ambulatory Visit (HOSPITAL_COMMUNITY): Payer: Self-pay

## 2024-11-29 ENCOUNTER — Telehealth: Payer: Self-pay

## 2024-11-29 DIAGNOSIS — J4530 Mild persistent asthma, uncomplicated: Secondary | ICD-10-CM

## 2024-11-29 NOTE — Telephone Encounter (Signed)
 Received notification via specialty pharmacy encounter that pt's copay for Dupixent  is now $100. Pt was using grant last year, but grant is currently closed. Sent message to pt to see if she's ok with copay, will also monitor to see if grant reopens.

## 2024-11-30 ENCOUNTER — Other Ambulatory Visit: Payer: Self-pay

## 2024-11-30 ENCOUNTER — Encounter: Payer: Self-pay | Admitting: Internal Medicine

## 2024-11-30 ENCOUNTER — Encounter

## 2024-11-30 ENCOUNTER — Encounter: Admitting: Internal Medicine

## 2024-11-30 DIAGNOSIS — Z006 Encounter for examination for normal comparison and control in clinical research program: Secondary | ICD-10-CM

## 2024-11-30 DIAGNOSIS — J849 Interstitial pulmonary disease, unspecified: Secondary | ICD-10-CM

## 2024-11-30 NOTE — Research (Cosign Needed)
 Title: A Randomized, Double-blind, Placebo-controlled, Multinational, Phase 3 Study of the Efficacy and Safety of Inhaled Treprostinil in Subjects with Progressive Pulmonary Fibrosis   Dose and Duration of Treatment: Treprostinil for Inhalation 0.6 mg/mL, or placebo (randomly assigned 1:1) over a 52 week period.  Protocol # [id number]  Sponsor: [employer] [place], [place] [address]  Protocol Version aAmendment 1 Dated 07Aug2023 IB: Version 20 dated 11Jul2025 ICF: Make sure that the subject is re-consented with the updated ICF Main ICF v2 IRB APPROVED AS MODIFIED 29May2024 Lab Manual: Version 1 dated 08Sep2023Investigator Brochure Product:  Treprostinil for Inhalation, 0.6 mg/mL  Key Features of the Drug: Treprostinil (LRX-15, 15AU81, UT-15) is a stable tricyclic benzindene analogue of the naturally occurring prostacyclin. Prostacyclin is known to lower pulmonary artery pressure, increase cardiac output without affecting heart rate, improve oxygen  transport, and potentially reverse pulmonary arterial remodeling. Treprostinil is a full agonist at the prostacyclin receptor (IP), leading to potent activation of the prostaglandin E receptor 2 (EP2) and D receptor 1. Activation of the EP2 receptor inhibits mast cell-mediated bronchoconstriction and has a range of inhibitory effects on fibroblast function that may be beneficial to IPF patients ([place] 2015, [place] 1995). In addition, activation of prostaglandin D receptor 1 has shown to decrease both inflammatory cell recruitment and collagen accumulation in the lung [place] 2010).   An inhalation solution of treprostinil, 0.6 mg/mL, delivered using an ultrasonic nebulizer, is currently FDA approved to treat pulmonary arterial hypertension (PAH); [WHO Group 1, PH-ILD; WHO Group 3]. Inhaled treprostinil is now being evaluated as primary treatment of IPF, idiopathic pulmonary fibrosis.   Key Inclusion Criteria: >= 18 years of  age Radiologic evidence of pulmonary fibrosis of > 10 % on HRCT Subject has a diagnosis of progressive pulmonary fibrosis (other than IPF) that fulfills at least 1 of the following criteria for progression within 24 months of screening despite standard treatment of ILD, as assessed by the Investigator:  Clinically significant decline in % predicted FVC based on >=10% relative decline  Marginal decline in % predicted FVC based on >=5% to <10% relative decline combined with worsening of respiratory symptoms Marginal decline in % predicted FVC based on >=5% to <10% relative decline combined with increasing extent of fibrotic changes on chest imaging Worsening of respiratory symptoms as well as increasing extent of fibrotic changes on chest imaging Subjects on pirfenidone  or nintedanib must be on a stable and optimized dose for >=90 days prior to Baseline. Concomitant use of both pirfenidone  and nintedanib is not permitted. Subjects treated with immunosuppressive agents (eg, mycophenolate , methotrexate, azathioprine, oral corticosteroids, rituximab ) need to be on treatment for at least 120 days prior to Baseline and, in the Investigators clinical opinion, must be refractory to treatment   Key Exclusion Criteria: Primary obstructive pathology (FEV1/FVC <0.70 at Screening) Prior intolerance or significant lack of efficacy to a prostacyclin or prostacyclin analogue that resulted in discontinuation or inability to effectively titrate that therapy.  Subject has diagnosis of IPF Subject has received any PAH-approved therapy. As needed use of a PDE5-I for erectile dysfunction is permitted, provided no doses are taken within 48 hours prior to any study-related efficacy assessments Receiving >10 L/min of oxygen  at rest at Baseline.  Exacerbation of ILD or active pulmonary or upper respiratory infection within 30 days prior to Baseline.  Uncontrolled cardiac disease, defined as myocardial infarction within 6  months prior to Baseline or unstable angina within 30 days prior to Baseline. Use of any investigational drug/device or participation  in any investigational study in which the subject received a medical intervention (ie, procedure, device, medication/supplement) within 30 days prior to Screening. Subjects participating in non-interventional, observational, or registry studies are eligible.  Life expectancy <12 months due to ILD or a concomitant illness.  Acute pulmonary embolism within 90 days prior to Baseline.   Pharmacodynamics/Pharmacokinetics:  Metabolism: Substantially metabolized by the liver, primarily CYP2C8. Of [place] administered treprostinil, only 4% is excreted unchanged in urine.  Protein Binding: 91% bound to human plasma proteins Elimination: Biphasic elimination with [place] administration with terminal elimination t1/2 of approximately 4 hours  Contraindications: None  Special Warnings/Precautions:  Due to substantial hepatic metabolism, liver impairment could result in decreased metabolism and increased systemic exposure to treprostinil.  Abrupt discontinuation or marked dose reduction can lead to sudden worsening of pulmonary arterial hypertension there is no evidence to suggest patients taking inhaled therapy are at risk for withdrawal symptoms or rebound effects.  Angioedema was identified in the post-approval use of inhaled treprostinil solution. As a result of the route of administration, acute contact dermatitis mimicking angioedema may be more likely associated with administration of inhaled treprostinil solution Treprostinil inhibits platelet aggregation and increases the risk of bleeding,  Symptomatic hypotension due to systemic and pulmonary vasodilator effects of Treprostinil may occur Single doses of Tyvaso 54 mcg and 84 mcg prolonged the Qtc interval by approximately 10 ms. The QTc effect decreased rapidly as concentration of treprostinil decreased.   Drug Interactions:    CYP2C8 is a major enzyme and CYP2C9 a minor enzyme in treprostinil metabolism in humans.     Safety Data: The estimate of worldwide exposure to treprostinil in the marketed inhaled formulations is approximately 85,258,140 total patient treatment-days (approximately 40,389 patient treatment-years).  most relevant insight into the expected safety profile of inhaled treprostinil were LRX-TRIUMPH 001 and RIN-PH-201,  en. These studies suggest that the most common adverse effects of inhaled treprostinil are related to the pharmacologic actions of the drug and to the route of administration: headache (41% with active treatment vs 23% with placebo), nausea (19% with active treatment vs 11% with placebo), and flushing (15% with active treatment vs <1% with placebo) were frequently reported.  Signs and symptoms of overdose with inhaled treprostinil solution may correspond to dose-limiting pharmacologic effects, including diarrhea, flushing, headache, hypotension, nausea, and vomiting. Most events are self-limiting and resolve with reduction or withholding of inhaled treprostinil solution. Provide general supportive care until the symptoms of overdose have resolved.   Adverse Reactions: Adverse effects are listed below that occurred in at least 5-10% of inhaled treprostinil subjects in prior studies. Of note, SAE were more common in the placebo group than treprostinil group.   Cough Headache Nausea Dizziness Flushing Throat Irritation  Pharyngolaryngeal pain Diarrhea Chest pain Jaw pain Epistaxis Syncope   Serious adverse events observed post-marketing:  Vomiting Pain Pneumonia Syncope Hemoptysis Hypotension    Xxxxxxxxxxxxx [facility] [place] Clinical Research Coordinator note:   This visit for Subject Rhonda [name] with DOB: 26-Mar-1951 on 11/30/2024 for the above protocol is Visit/Encounter # baseline  and is for purpose of research.  THe Subject ID for the study is  [id number]  BRYLA BUREK  [facility]) expressed continued interest and consent in continuing as a study subject. Subject confirmed that there was  no  (NO/YES) change in contact information (e.g. address, telephone, email). Subject thanked for participation in research and contribution to science. In this visit 11/30/2024 the subject will be evaluated by Sub-Investigator) named Dr Tish.  This research coordinator has verified that the above investigator is  up to date with his/her training logs.   The Subject was informed that the PI Dorethia Cave continues to have oversight of the subject's visits and course through relevant discussions, reviews, and also specifically of this visit by routing of this note to the PI.  1. This visit is a key visit of randomization (screening, randomization, end of study). The PI is not (YES/NOT) available for this visit.    Because the PI is NOT available, the Sub-Investigator reported and CRC has confirmed that the PI has (HAS) discussed the visit a-priori with the Sub-Investigator.  2.  In addition, ahead of the key visit of  randomization the visit and subject were discussed with the PI Dorethia Cave on date of 14 Nov 2024 face to face as part of direct PI oversight.  All assessments were done as per the protocol. Further details in the binder.  Signed by  Mansfield Pottier  Clinical Research Coordinator  [facility]  [place], [place] Signature Time/Date: 2:21 PM 11/30/2024

## 2024-11-30 NOTE — Progress Notes (Signed)
 Patient is not taking Dupixent  per MyChart message - please advise why refill is in process? Did patient initiate She hasn't filled since end of October 2025.

## 2024-12-04 NOTE — Telephone Encounter (Signed)
 Ideally full dose eliquis  is 1-2 years and then 1-2 years of low dose eliquis   though we can make a case to go to low dose eliquis  after 6 months which is now (PE 05/22/24) but wil need a d-dimer check  Prdered a d-dimer which she can get anytime. If thi s is normal then we can be more confident of going to low dose

## 2024-12-05 ENCOUNTER — Other Ambulatory Visit: Payer: Self-pay

## 2024-12-06 ENCOUNTER — Other Ambulatory Visit (HOSPITAL_COMMUNITY): Payer: Self-pay

## 2024-12-06 NOTE — Progress Notes (Signed)
 Rhonda [name], date of birth 1951-02-03, was seen 8/January/2026 to determine eligibility for participation in [facility] as subject [id number].   New medical history included acute bronchitis prior to Christmas.  This was associated with nasal congestion , rhinitis and yellow-green secretions.  She was offered an office visit but declined.  She took doxycycline  and short course of oral steroids which had been prescribed for such acute episodes. Positive physical findings included distant breath sounds; slight increase in S1 and S2; 1+ pedal edema; and non palpable pedal pulses. All findings were not clinically significant. EKG reviewed. Elsie FALCON. Tish, MD, SI

## 2024-12-07 ENCOUNTER — Encounter: Payer: Self-pay | Admitting: Internal Medicine

## 2024-12-07 ENCOUNTER — Telehealth: Payer: Self-pay | Admitting: Cardiology

## 2024-12-07 ENCOUNTER — Ambulatory Visit: Admitting: Internal Medicine

## 2024-12-07 VITALS — BP 122/64 | HR 68 | Temp 98.2°F | Ht 59.0 in | Wt 154.0 lb

## 2024-12-07 DIAGNOSIS — J4550 Severe persistent asthma, uncomplicated: Secondary | ICD-10-CM

## 2024-12-07 DIAGNOSIS — J849 Interstitial pulmonary disease, unspecified: Secondary | ICD-10-CM | POA: Diagnosis not present

## 2024-12-07 DIAGNOSIS — Z7189 Other specified counseling: Secondary | ICD-10-CM

## 2024-12-07 DIAGNOSIS — R042 Hemoptysis: Secondary | ICD-10-CM | POA: Diagnosis not present

## 2024-12-07 DIAGNOSIS — J679 Hypersensitivity pneumonitis due to unspecified organic dust: Secondary | ICD-10-CM

## 2024-12-07 DIAGNOSIS — R233 Spontaneous ecchymoses: Secondary | ICD-10-CM

## 2024-12-07 DIAGNOSIS — J9611 Chronic respiratory failure with hypoxia: Secondary | ICD-10-CM | POA: Diagnosis not present

## 2024-12-07 DIAGNOSIS — Z7952 Long term (current) use of systemic steroids: Secondary | ICD-10-CM

## 2024-12-07 DIAGNOSIS — Z86711 Personal history of pulmonary embolism: Secondary | ICD-10-CM | POA: Diagnosis not present

## 2024-12-07 DIAGNOSIS — K117 Disturbances of salivary secretion: Secondary | ICD-10-CM

## 2024-12-07 DIAGNOSIS — J301 Allergic rhinitis due to pollen: Secondary | ICD-10-CM

## 2024-12-07 DIAGNOSIS — Z79899 Other long term (current) drug therapy: Secondary | ICD-10-CM

## 2024-12-07 DIAGNOSIS — Z5181 Encounter for therapeutic drug level monitoring: Secondary | ICD-10-CM

## 2024-12-07 NOTE — Telephone Encounter (Signed)
 Pt c/o medication issue:  1. Name of Medication:   ezetimibe  (ZETIA ) 10 MG tablet   2. How are you currently taking this medication (dosage and times per day)?   Patient stated she has stopped taking this medication  3. Are you having a reaction (difficulty breathing--STAT)?   4. What is your medication issue?   Patient stated she has stopped taking this medication as it is causing her to have frequent urination.  Patient wants alternate medication.

## 2024-12-07 NOTE — Patient Instructions (Addendum)
 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 oil pain and ? mold in house ILD (interstitial lung disease) (HCC) Chronic respiratory failure with hypoxia (HCC) High risk medication use Visit for monitoring Rituxan  therapy Current chronic use of systemic steroids  -Symptoms and exercise oxygen  worse even compared to September 2025.  This is indicating progressive disease. - CellCept  caused pancreatitis in September 2025 and is currently off - Since Jan 2026 on [facility] protocol research protocol and on inhaled treprostinil versus placebo = Shared decision making to hold off on new standard of care Nerandomilast because of ongoing diarrhea - Respect to decision to forego lung transplant as a care option  Plan -Continue inhaled treprostinil versus placebo since January 2026 on research protocol - Continue prednisone  at 35 mg/day [you are self titrating it down but understand that you might go to 40 mg/day based on your symptoms] - - Continue Rituxan  every 6 months [last dose May 25, 2024 and next dose would be in January 2026 and then February 2026] - -  Continue Bactrim  for PJP prophylaxis - will  blood work CBC chemistry and  liver function done at research - so no nned to get 12/07/2024 She just needs a D-dimer   Leukocytopsis   Plan  - 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  Currently on hold  Plan  - continue dupixent  - RESTRT - contiue humidified o2   Diarrhea due to drug metformin   - Holding off on Nerandomilast because of this at this point  Plan  -Take CAROB Flour for Diarrhea due to medication as follows Take 1 DESSERT spoon  size serving [approximately 7 g] before breakfast If still no response in 3 days then add another 7 g at dinner If still no response in 3 days then make it to spoon servings at breakfast and 2 spoon servings at dinner and hold NOTE: Always MIX the CAROB FLOUR with WATER  or MILK or  JUICE - MIGHT NEED A BLENDER to do it DO NOT EAT CAROB POWER DIRECTLY - it can choke or make you cough Thank you    Hemoptysis new onset x 2 in January 2026 - Probably due to combination of inhaled treprostinil/placebo and Eliquis    Plan  - See pulmonary embolism section below - Monitor - check d-dimer  History of pulmonary embolism- small July 2025  -Concerns of easy bruising  Plan  - continue eliquis  but check D-dimer 12/07/2024 based on results we can reduce to low-dose  -  if bleediong severe have to stop or lower dose   Easy bruising  -Probably due to steroids and Eliquis .  Plan - Based on D-dimer we are going to reduce the Eliquis  dose  Steroid INduced High sugar  Plan  - per Dr Tawni Hidalgo   Mobility due to hypoxemia  Plan  - motorized wheel chair  Drooling 0 new x 1 months  ? Cause  Plan  - per PCP  Goals of care  - Life expectancy is limited probably few years because of the advanced ILD and also immunosuppression  Plan  - Agree quality of life is the most important issue. -We are also trying to prevent the fibrosis from getting worse in order to increase life expectancy. - Avoid respiratory illness sick exposure and control your risk for respiratory infection  - be uptodate with all respiratory vaccines  - avoid sick contacts especially in areas of indoor clusters (churches, weddings, funerals, family  gatherings, birthdays, planes, malls, indoor areas especially)   - mask in these areas   - discourage sick people from coming in close contact with you   [place] - Continue with research visits - 8 weeks 30 minutes standard of care visit with Dr. Geronimo

## 2024-12-07 NOTE — Progress Notes (Signed)
 "       06/14/2024 -   Chief Complaint  Patient presents with   Medical Management of Chronic Issues   Interstitial Lung Disease    Breathing has been slightly worse since last visit here. She is not coughing. She has not been using o2 much unless heavy exertion.      HPI Rhonda [name] 74 y.o. -presents for follow-up.  Presents with her husband [name].  This is posthospital follow-up.  Number of issues.- Hospitalized again 05/22/2024-7 8/25 with acute worsening of respiratory failure associated with steroid taper.  Status post small bilateral pulmonary embolism diagnosis and started on anticoagulation.  Negative bronchoscopy for organisms.  Restarted on high-dose steroid pulse dose and commented to Rituxan  cycle 1 day 0 05/25/24 and day 15 (on 06/12/2024]  - Course complicated by rectal bleeding and showed polyps anal fissure versus hemorrhoid.  -Blurred vision with high-dose steroids  At this point in time in terms of  #Interstitial lung disease: Basically over the years she seems to be requiring more and more dose of prednisone  to prevent her from having hypoxemic exacerbations.  It used to be 5 mg and then it was 10 mg and then 20 mg and then now anywhere from 40 to 60 mg.  With each taper she gets exacerbations.  With started on CellCept  in the spring 2025 but this got interrupted because of viral exacerbation.  Then we treated empirically with Bactrim  for PJP.  Then in early July 2025 she had respiratory worsening again diagnosed with a small pulmonary embolism but hypoxemia is felt to be out of proportion.  She was seen by Dr. Theophilus in the hospital.  Treated with pulsed dose steroid and also is committed to IV Rituxan .  This is since helped her hypoxemia.  Today she was able to do a sit/stand test without desaturation.  But she only did 5 times.  At home she is no longer hypoxemic room air at rest.  In the hospital she was on significant amount of oxygen .  At this point in time she is triple  immunosuppressed with prednisone  60 mg, status post Rituxan  cycle 1 and also CellCept .  She is tolerating these medicines okay except for various other symptoms which might be medication side effect [see below].  #Small PE continues on Eliquis   #Rectal bleeding in the hospital she had rectal bleeding.  She is on Eliquis  with the thought process was this was related to hemorrhoids she had colonoscopies for polyps.  She is going to have anal banding  #Dizziness: This was supposed to even before the admission.  Post discharge she fell and went to the ER head bleed was ruled out.  She states that the steroid has been lowered this is slightly better.  #Blurring of the vision this is significantly present with high-dose of steroids which is required for the lungs.  She wants to see an endocrinologist have referred her to Dr. Tawni Marking.  Dr. Darice Henle her primary care doctor is also recommending this.  #Pedal edema: She developed new onset pedal edema after discharge when I gave her Lasix  for a week it resolved but it is coming back again she is been off Lasix  for 4 days.  Will check a BNP.  Will put her on permanent Lasix .       OV 07/27/2024  Subjective:  Patient ID: Rhonda [name], female , DOB: [birth year] , age 74 y.o. , MRN: [id number] , ADDRESS: [address] [place] [id number]  PCP Burney Darice CROME, MD Patient Care Team: Burney Darice CROME, MD as PCP - General (Family Medicine) Geronimo Amel, MD as Consulting Physician (Pulmonary Disease) Edith, Debby BROCKS, MD (Cardiology) Wonda Cy BROCKS, RD as Dietitian (Family Medicine) Lavona Agent, MD as Consulting Physician (Cardiology)  This Provider for this visit: Treatment Team:  Attending Provider: Geronimo Amel, MD    07/27/2024 -   Chief Complaint  Patient presents with   Acute Visit    She has had some swelling in her feet over the past few days. She has had some increased SOB and increased need for o2. She has  had some prod cough with green sputum and some bloody nasal d/c.       HPI Rhonda [name] 74 y.o. -acute visit.  On Monday, July 17, 2024 she got her first dose of Dupixent .  This strategy has because she has allergic asthma as well and she is being very steroid-dependent and super immunosuppressed was to introduce biologic agent against eosinophils to reduce airway inflammation and hopefully wean her off steroids.  However after she took the first dose [2 injections] she had extreme fatigue for 4 days.  She is somewhat reluctant about doing this but then she agreed to do it.  Then on on Friday, July 21, 2024 she tapered herself down to prednisone  30 mg/day.  [Her lungs are very prednisone  responsive but she is now on triple immunosuppression and the goal is to slowly get it down to a lower dose of prednisone  in order to avoid side effects of diabetes and other prednisone  side effects] then on Sunday, 07/23/2024 she started noticing green sputum and easy desaturations.  Then on Monday, 07/24/2024 she saw Darice Burney her primary care physician who put her on doxycycline  which she is taking.  But she still had increased desaturations or easy desaturation so she called me she says she is needing 3 L oxygen  with exertion.  She is worried about this.  Advised her to increase her prednisone  to 60 mg but she did not do it and still she opted to do acute visit today.  Coincidently she also stopped Lasix  a few days ago because her edema had resolved but the edema came back and now she is starting Lasix .  She stopped her Lasix  on Friday, 07/21/2024 the same day she dropped her prednisone  down to 30 mg/day.  She is also concerned about significant hyperglycemia.  She is changing her endocrinologist to [place] due to personal preference.  She is most concerned about her green sputum and easy desaturations.  Along with the fact she has hyperglycemia.  Her transplant evaluation is currently on hold but I  encouraged her to revisit this.     OV 09/14/2024  Subjective:  Patient ID: Rhonda [name], female , DOB: [birth year] , age 74 y.o. , MRN: [id number] , ADDRESS: [address] [place] [id number] PCP Burney Darice CROME, MD Patient Care Team: Burney Darice CROME, MD as PCP - General (Family Medicine) Geronimo Amel, MD as Consulting Physician (Pulmonary Disease) Edith, Debby BROCKS, MD (Cardiology) Wonda Cy BROCKS, RD as Dietitian (Family Medicine) Lavona Agent, MD as Consulting Physician (Cardiology)  This Provider for this visit: Treatment Team:  Attending Provider: Geronimo Amel, MD    09/14/2024 -   Chief Complaint  Patient presents with   Interstitial Lung Disease    Pt states since LOV breathing has been pretty good using O2, when sitting down or resting pt doesn't always use O2 SOB  occurs w/ exertion and any activity Prod cough ( phlegm green) in morning after 10:00am (phlegm white, clear)  House pt on O2 3L Out moving around  pt is using 4-5L  pulse      HPI Rhonda [name] 74 y.o. -  #Interstitial lung disease and chronic respiratory failure and immunosuppression  Candie Gintz is a 74 year old female with pulmonary fibrosis who presents with worsening oxygenation and medication side effects.  She experiences worsening oxygenation issues, with stable levels while sitting or lying down, but significant drops during movement. Her portable oxygen  concentrator, set at four liters, is insufficient during exertion, such as walking approximately 125 feet, with levels dropping to 81-84%. At home, a larger machine maintains her oxygen  levels, but the portable unit is inadequate for outdoor use *especially pulsed use.  She has a history of pulmonary fibrosis and has been on prednisone  therapy. She started on a regular dose of 30 mg of prednisone  on July 21, 2024, after increased breathlessness and the need for supplemental oxygen  when her dose was reduced  to 20 mg. Over a decae, her prednisone  requirement has increased from 5 mg to 40 mg to maintain adequate oxygenation.  Recently had tried having alternate between 20 and 30 mg of prednisone  as part of taper but this made her sugars fluctuate and so she put her back on 30 mg.  Even at 30 mg not able to maintain oxygenation.  She said at 40 mg she was able to maintain oxygenation.    She has also been on Rituxan  since July 2025.  She recently stopped taking CellCept  *late September 2025 due to adverse effects, including stomach pain, nausea, headaches, and leg swelling.  Also included ER visit for acute pancreatitis.  Since discontinuing CellCept , she feels significantly better, with the resolution of these symptoms and improved leg swelling. However, she occasionally experiences diarrhea.  She has upcoming lung transplant evaluation appointment at [facility].  She is worried about the disadvantages of lung transplant such as significant adverse drug reactions.  I have asked her to discuss this with the transplant team.  We discussed the new antifibrotic Nerandomilast and she is worried about the GI side effects but at this point in time is standard of care and approved.  We took a shared decision making for her to take this.  #Other issues - #Asthma/cough  She is currently taking Dupixent , which has helped alleviate her wheezing and cough, especially after adding a humidifier to her oxygen  concentrator. The Dupixent  injections are painful, but they have improved her respiratory symptoms.  # Nail changes She has noticed changes in her nails, specifically the development of Beau's lines and possible clubbing, which I associated  with her lung disease. She is concerned about her high white blood cell count, which has been elevated for some time, though she no longer experiences fever or night sweats, which she had during a previous episode of pancreatitis.  #Mobility - Because of increasing  oxygen  needs she feels she needs a scooter - Also wrote [employer] prescription for emergency oxygen  electricity restoration.  # Edema and pancreatitis - Resolved after stopping CellCept .   #Hyperglycemia - She is now registered with Dr. Tawni Marking  #Leukocytosis - She has been running a high white count since 2020 around 15,000 but the latest shows it is now 21,800.  Cause is not known.  She does have some night sweats but they have resolved.  There is no abdominal pain  and she is actually feeling good other than her worsening shortness of breath.    OV 12/07/2024  Subjective:  Patient ID: Rhonda [name], female , DOB: [birth year] , age 78 y.o. , MRN: [id number] , ADDRESS: [address] [place] [id number] PCP Burney Darice CROME, MD Patient Care Team: Burney Darice CROME, MD as PCP - General (Family Medicine) Geronimo Amel, MD as Consulting Physician (Pulmonary Disease) Edith, Debby BROCKS, MD (Cardiology) Wonda Cy BROCKS, RD as Dietitian (Family Medicine) Lavona Agent, MD as Consulting Physician (Cardiology)  This Provider for this visit: Treatment Team:  Attending Provider: Geronimo Amel, MD    #GE reflux with small hiatal hernia  - on ppi   #History of rapid heart rate not otherwise specified  - Start her on Lopressor   2008/2009 by primary care physician. History appears to correlate with onset of respiratory issues  - refuses to dc this drug as of 2014 discussion   # Hypersensitivity Pneumonitis and ILD  - Potential etiologies - cockateil x 2 for 18 years through end 2012. 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 Denies mold but lives in house built in [place] with a weird baselment: and has humidifier  - first noted on CT chest 10/15/09 following trip to [place] (PE negative) but not described in 2003 CT chest report  - autoimmune panel: 10/23/11: Negative  -  Uderwennt bronch 11/19/11   - non diagnostic  - VATS Nov 2013 (done after initially refusing)- CRONIC HYPERSENSITIVITY PNEUMONITIS WITH GRANULOMA. However, there is worrying trend of UIP pattern in the Upper lobes.     Follow-up chronic hypersensitive pneumonitis ; slow progressive phenotype -  On ILD-pro registry protocol - On chronic prednisone   - 5mg  per day but stince spring 2024 needing more brsts and settled on 20mg  per day as of Nov 2024, high-dose prednisone  in spring 2025 and tapered and flared up and then back on high-dose prednisone  in July 2025 after pulsed dose steroid in the hospital     - end July 2025 -> 50mg  x 2 weeks -> pred 40mg  daily -> 30mg  on 07/23/24 - failed ofev  due to side efect  - failed esbriet  due to sde effects  March 2022   - declind lung tx referral 2023 , reconsider this in the spring 2025  - declined cellcept  May 2024;  Started CellCept  March 2025 and then there was a pause and restarted - > STOPPED 08/15/23    - PANCREATITIS ER VISIT - STopped cellcept  Sept 2025  - Started Rituxan  July 2025 -next dose January/February 2026  - Started on inhaled treprostinil versus placebo for [facility] 305 study [primary treatment for progressive phenotype]      -Right heart cath  -March 2025 with normal pulmonary pressure mean PA 19 but concern for right-to-left shunt    - Hospitalized -  03/14/2024 -03/17/2024 because of acute on chronic hypoxemic respiratory flare requiring steroids.  Respiratory virus panel positive for rhinovirus.  Follow-up in clinic 4/ 28/25 with persistent respiratory failure and decompensation clinically: Empirically treated with 3 weeks Bactrim  for empiric PJP [facility] high].  During this time CellCept  on hold and then restarted.  Restarted Bactrim  for PJP prophylaxis after this  - Hospitalized again 05/22/2024-7 8/25 with acute worsening of respiratory failure associated with steroid taper.  Status post small bilateral pulmonary embolism diagnosis and started on anticoagulation.   Negative bronchoscopy for organisms.  Restarted on high-dose steroid pulse dose  and commented to Rituxan  cycle 1 day 0 05/25/24 and day 15 (on 06/12/2024]. - Course complicated by rectal bleeding and showed polyps anal fissure versus hemorrhoid. Blurred vision with high-dose steroids  -Pulmonary embolism June 2025: Started on Eliquis   - Allergic asthma   - started dupixent  Jul 17, 2024 Monday [paused October through January 2026) restart January 2026  -Chronic steroid use - Leukocytosis   12/07/2024 -   Chief Complaint  Patient presents with   Interstitial Lung Disease    Pt states since LOV breathing has been getting a little worse, even when oxygen  is stable pt is still becoming SOB SOB occurs w/ any activity Prod cough ( phlegm greenish yellow by afternoon becomes clear) pt stated this morning she thinks she coughed up blood Pt is currently on 3-4 L of 02 on continous when on pulse 02 is on 4L sometimes 5     HPI Rhonda [name] 74 y.o. -returns for follow-up.  Presents the husband Mr. [name] Novak.  Several issues and in no specific order   #Leukocytosis with bandemia: Saw Dr. Lonn 11/07/2024 deemed secondary reaction.  #Diabetes: Currently on metformin  along with insulin .  Dr. Darice Henle is managing this.  And she believes that metformin  is causing diarrhea and they plan to stop it.  #Tremors: New issue of tremors: Random for the last 6 weeks is very mild.  Writing is shaky as a result  #Diarrhea: Secondary to metformin .  Been going on for over 6 months she is sensitive to a lot of drugs.  I have recommended CAROB  #Allergic asthma: Dupixent  is on hold since October.  She plans to restart.  She feels the coughing and wheezes improved.  I did encourage her to restart because of the anti-inflammatory effect on the lungs.  #New issue of drooling of saliva at night for the last 1 month.  Husband also seems to have it she believes might be related to acid reflux.  She will talk  about this with primary care physician.  #Steroid dependency: She is trying to taper her prednisone  she down to 35 mg/day but she says 40 mg was doing better for her but she is going to try coming down on the prednisone  because of the immunosuppression along with Rituxan   #Rituxan  therapy for ILD: Next dose is January and February 2026.  After that we will reassess whether she needs it  #Easy bruising: She is on Eliquis  for the PE since summer 2025.  That along with the steroids she is noticing a lot of bruises around her arm and forearm particularly when she touches the door.  She is wondering about going down on the Eliquis  dose  #History of pulmonary embolism summer 2025: She is not been taking Eliquis  for while she is worried about bleeding risk she is already had hemorrhoidal bleeds.  Told her we will check a D-dimer today and if it is normal definitely go down to low-dose Eliquis .  #Interstitial lung disease she is intolerant to CellCept  not tolerant to pirfenidone  not tolerant to nintedanib all because of GI side effects.  She is progressive phenotype because of the hypersensitive pneumonitis she tolerated first dose of Rituxan  6 months ago.  Will challenge her with second dose Rituxan  January February 2026 and then reassess.  Meanwhile she is enrolled in inhaled treprostinil versus placebo as primary treatment for progressive ILD she is been randomized last week.  She is tolerating the drug well she believes.  However she has had 2  episodes of hemoptysis.  Is not interested in transplant  #Mild episodes of hemoptysis: X 2.  She is on Eliquis .  This could also be become the treprostinil study drug versus placebo.  As it definitely happened after she got into the study therefore related we will continue to watch.  #Diarrhea: She believes related to metformin  but she is also taking some enzymes if it is helping.  I talked to her about CAROB and she will try this.  #Chronic hypoxemic respiratory  failure: She is on 3.5 L continuous she easily desaturates when she stoops or bends but it does come back quickly when she s rest  #Goals of care and prognosis: We had a conversation about this.  I told her she does have very severe disease I did tell her that if she was on 6 L oxygen  at rest and requiring 8 or 10 L with exertion that was very very severe and in my experience people in that stage I only seen a few live up to a year and many of them died within months.  At her stage monte is not they are] patients can live few years.  On the other hand if you get new drugs working for her or we keep the drug stable then she could live longer.  She is immunosuppressed and she is on steroids and these 2 will affect life expectancy..  She believes she is slowly dying.  In 2025 the illness deteriorated after she was exposed with a grandchild with a respiratory illness.  Therefore she has been isolated much from the grandchild's been very hard on her.   SYMPTOM SCALE - ILD 07/08/2021 147# - off esbriet . Sic past week 08/14/2021 149# - pred only. No antifibrotic 10/03/2021 148# - pred 5mg  07/21/2022 137# 10/27/2022  04/13/2023 20mg  pered - 134# 06/08/2023 135# , pred 20mg  10/06/2023 138# 04/13/2024  06/14/2024 Pred 60  Rituxan  July 2025  Cellcept   bactrim  12/07/2024 Prednisone  35 mg  Second dose of Rituxan  pending January 2026  Off CellCept   Start inhaled treprostinil versus placebo on study protocol January 2026  O2 use   2LL o2 with ex 2L with ex 2 laps on RA        Shortness of Breath             At rest 1 0 1 0 1 1 0 0 1 1  1  Simple tasks - showers, clothes change, eating, shaving 2 1 2 1 2 1 1 1 1 2 3   Household (dishes, doing bed, laundry) 4 2 3.5 2 3 2 2 2 3 2 4   Shopping 2 1 1 1 2 1 2 1 1 3 3   Walking level at own pace 2 2 3 1 2 2 1 3 1 2 3   Walking up Stairs 4 3 4 3 4 3 4  2.5 3 5 5   Total (30-36) Dyspnea Score 15 9 14.5 8 14 10 10 9 10 15 19   How bad is your cough? 2 2 2 2 3  0 2 1 1  0 2   How bad is your fatigue 2 0 3 1 2 2 4 2 5 3  3.5  How bad is nausea 0 0 1 0 0 0 0 0 0 0  0  How bad is vomiting?  0 0 1 0 0 0 0 0 0 0  0  How bad is diarrhea? 0 0 1 0 0 0 1 0 0 0  4  How bad is anxiety? 00 0 2  0 00 0 0 0 0 1 2  How bad is depression 1 0 1 0 0 0 0 0 0 2  2  00 0 0 Simple office walk 185 feet x  3 laps goal with forehead probe 04/13/2018  08/29/2020  01/14/2021  07/21/2022  10/27/2022  04/13/2023  06/08/2023  10/06/2023  04/13/2024  06/14/2024  07/27/2024  09/14/2024   O2 used Room air ra ra ra ra ra Ra sit stand ra   ra 95% RA a rest  Number laps completed 3 3 3 3  Ddi onl 2 las Sit/stand x 10 times X 10 Sit stand x 15   Walked 1 of 3 laps   Comments about pace good  avg pace avg avg Good pace        Resting Pulse Ox/HR 98% and 73/min 97% and 67/mi 94% and 80/min 98% ad HR 72 100% and HR 79 97% and heart rate 74 93% and HR 83 98% and HR 91   96% abd HR 76 95% RA at rest,   Final Pulse Ox/HR 91% and 91/min 88% and 94 89% ad 88.nin 93% and HR 87 84% and HR 104 94% and heart rate 92 87% and HR 90 92% and HR 105 Walked 1 lap on room air went down to 90%. Did sit stand x 5 and did well statyed 97% 87% and HR 98 at 1 laps 87% and1/4th of lap  Desaturated </= 88% no yes almost no yes no yes       Desaturated <= 3% points yes  Yes, 5 points 5 pots 16 pont Yes, 3 poi yes       Got Tachycardic >/= 90/min yes   no          Symptoms at end of test none Mild to moderate dyspnea  Very mild dyspneax moderate No dyspnea Mild dyspnea       Miscellaneous comments x   ? better worse       4L Pulse at rest 92% -> walked 1.5 LAPS (300 feet) 90%       PFT     Latest Ref Rng & Units Research  11/30/2024 10/09/2024    9:35 AM 06/14/2024    8:19 AM 04/05/2024    8:21 AM 10/01/2023    9:49 AM 06/04/2023   10:12 AM 03/08/2023   10:49 AM 12/09/2022   11:48 AM  PFT Results  FVC-Pre L 1.33 1.27  1.48  1.52  1.43  1.44  1.45  1.53   FVC-Predicted Pre % 60% 54  63  64  60  60  64  66   Pre FEV1/FVC % %   94  89  87  92  91  93  87   FEV1-Pre L  1.19  1.32  1.33  1.31  1.30  1.35  1.32   FEV1-Predicted Pre %  68  75  76  73  73  80  77   DLCO uncorrected ml/min/mmHg  8.53  7.71  8.29  9.73  10.82  8.11  9.64   DLCO UNC% %  51  46  49  58  64  50  59   DLCO corrected ml/min/mmHg 5.6 8.88  7.89  8.24  9.73  10.82  7.81  9.41   DLCO COR %Predicted % 81% 53  47  49  58  64  48  58   DLVA Predicted %  72  76  78  81  85  65  81   TLC L       3.02    TLC % Predicted %       70    RV % Predicted %       70      HRCT 11/09/24   IMPRESSION: 1. Pulmonary parenchymal pattern of interstitial lung disease and air trapping, as detailed above, stable from at least 01/27/2023. Findings are compatible with the provided diagnosis of fibrotic nonspecific interstitial pneumonitis. Findings are suggestive of an alternative diagnosis (not UIP) per consensus guidelines: Diagnosis of Idiopathic Pulmonary Fibrosis: An Official ATS/ERS/JRS/ALAT Clinical Practice Guideline. Am JINNY [employer] Crit Care Med Vol 198, Iss 5, [phone], Jul 24 2017. 2. 6 mm posterior left lower lobe nodule, stable. Recommend attention on follow-up. 3. Aortic atherosclerosis (ICD10-I70.0). Coronary artery calcification.     Electronically Signed   By: Newell Eke M.D.   On: 11/11/2024 12:57   LAB RESULTS last 96 hours No results found.       has a past medical history of Allergy, Arthritis, Asthma, Cataract, Coronary artery calcification seen on CAT scan (08/19/2017), DDD (degenerative disc disease), thoracic, Depression, DOE (dyspnea on exertion), Fatty liver, GERD (gastroesophageal reflux disease), H/O steroid therapy, Helicobacter pylori ab+, Hemorrhoids, Hiatal hernia, High cholesterol, History of chronic bronchitis, History of migraine, History of MRSA infection, Hyperlipidemia, mixed (08/19/2017), Hyperplastic colon polyp (2007), IBS (irritable bowel syndrome), Inguinal hernia, Insulin  resistance, Interstitial lung  disease (HCC), Leukocytosis (2025), MVP (mitral valve prolapse), Pancreatitis (2025), Pneumonia, Pneumonitis, hypersensitivity (HCC), PONV (postoperative nausea and vomiting), Pre-diabetes, Pulmonary embolism (HCC), Pulmonary fibrosis (HCC), PVC (premature ventricular contraction) (08/19/2017), Rapid heart rate, Squamous cell carcinoma of skin, Tinnitus, right ear, and Vocal cord ulcer.   reports that she has never smoked. She has never used smokeless tobacco.  Past Surgical History:  Procedure Laterality Date   60 HOUR PH STUDY N/A 02/21/2018   Procedure: 24 HOUR PH STUDY;  Surgeon: Shila Gustav GAILS, MD;  Location: [facility];  Service: Endoscopy;  Laterality: N/A;   BREAST BIOPSY Right 2009   BIOPSY, pt denies   BREAST BIOPSY Left 2003   Benign    CATARACT EXTRACTION Left    CESAREAN SECTION     COLONOSCOPY     COLONOSCOPY N/A 05/28/2024   Procedure: COLONOSCOPY;  Surgeon: Albertus Gordy HERO, MD;  Location: [facility];  Service: Gastroenterology;  Laterality: N/A;   ESOPHAGEAL MANOMETRY N/A 02/21/2018   Procedure: ESOPHAGEAL MANOMETRY (EM);  Surgeon: Shila Gustav GAILS, MD;  Location: [facility];  Service: Endoscopy;  Laterality: N/A;   FOOT FRACTURE SURGERY  2006 or 2007   right   HYMENECTOMY     LUNG BIOPSY  09/28/2012   Procedure: LUNG BIOPSY;  Surgeon: Elspeth JAYSON Millers, MD;  Location: [facility] OR;  Service: Thoracic;  Laterality: N/A;  lung biopsies tims three   RIGHT HEART CATH N/A 04/04/2024   Procedure: RIGHT HEART CATH;  Surgeon: Rolan Ezra RAMAN, MD;  Location: [facility] INVASIVE CV LAB;  Service: Cardiovascular;  Laterality: N/A;   SQUAMOUS CELL CARCINOMA EXCISION Left    left arm   TOTAL HIP ARTHROPLASTY Right 09/10/2015   Procedure: RIGHT TOTAL HIP ARTHROPLASTY ANTERIOR APPROACH;  Surgeon: Donnice Car, MD;  Location: [facility];  Service: Orthopedics;  Laterality: Right;   TOTAL HIP ARTHROPLASTY Left 10/03/2019   Procedure: TOTAL HIP ARTHROPLASTY ANTERIOR APPROACH;  Surgeon: Car Donnice, MD;  Location: [facility];  Service: Orthopedics;  Laterality: Left;  70 mins   TUBAL LIGATION  UPPER GI ENDOSCOPY     VIDEO ASSISTED THORACOSCOPY  09/28/2012   Procedure: VIDEO ASSISTED THORACOSCOPY;  Surgeon: Elspeth JAYSON Millers, MD;  Location: [facility] OR;  Service: Thoracic;  Laterality: Right;   VIDEO BRONCHOSCOPY  11/19/2011   Procedure: VIDEO BRONCHOSCOPY WITH FLUORO;  Surgeon: Dorethia Cave, MD;  Location: [facility] ENDOSCOPY;  Service: Endoscopy;;   VIDEO BRONCHOSCOPY Bilateral 05/23/2024   Procedure: VIDEO BRONCHOSCOPY WITHOUT FLUORO;  Surgeon: Mannam, Praveen, MD;  Location: [facility];  Service: Cardiopulmonary;  Laterality: Bilateral;    Allergies[1]  Immunization History  Administered Date(s) Administered   Fluad Quad(high Dose 65+) 07/17/2019, 09/04/2022   Fluad Trivalent(High Dose 65+) 08/29/2023   Hepatitis A 05/23/2010   Hepatitis B 06/23/2006   INFLUENZA, HIGH DOSE SEASONAL PF 08/24/2016, 07/24/2017, 08/31/2018, 08/15/2020, 09/26/2020, 09/12/2021, 08/24/2022, 09/14/2024   Influenza Split 08/11/2012, 08/14/2017   Influenza Whole 09/08/2011   Influenza,inj,Quad PF,6+ Mos 09/07/2013   Influenza-Unspecified 09/26/2014, 07/27/2015, 08/24/2023   MMR 05/23/2010   Moderna Covid-19 Vaccine Bivalent Booster 65yrs & up 07/30/2021   Moderna Sars-Covid-2 Vaccination 05/07/2021, 07/30/2021, 01/01/2022   PFIZER(Purple Top)SARS-COV-2 Vaccination 12/16/2019, 01/08/2020, 07/18/2020, 09/26/2020, 08/11/2022   PNEUMOCOCCAL CONJUGATE-20 10/09/2024   Pfizer(Comirnaty)Fall Seasonal Vaccine 12 years and older 08/11/2022, 07/24/2023   Pneumococcal Conjugate-13 03/15/2014   Pneumococcal Polysaccharide-23 01/11/2018, 09/26/2020, 08/26/2021   Respiratory Syncytial Virus Vaccine,Recomb Aduvanted(Arexvy) 07/28/2022   Td 02/22/2003   Tdap 10/28/2012   Zoster Recombinant(Shingrix) 02/15/2017, 05/17/2017   Zoster, Live 12/04/2010    Family History  Problem Relation Age of Onset   Asthma  Mother    Osteoarthritis Mother    Dementia Mother 51   Lymphoma Father    Diabetes Father    Hypertension Sister    Heart disease Sister    Kidney disease Sister    Allergic rhinitis Daughter    Ovarian cancer Maternal Aunt    Breast cancer Paternal Aunt    Breast cancer Paternal Aunt    Emphysema Maternal Grandmother    Asthma Maternal Grandmother    Lung disease Maternal Grandfather    Bone cancer Paternal Grandfather    Breast cancer Cousin    Breast cancer Cousin    Breast cancer Cousin    Colon cancer Neg Hx    Angioedema Neg Hx    Eczema Neg Hx    Immunodeficiency Neg Hx    Urticaria Neg Hx     Current Medications[2]      Objective:   Vitals:   12/07/24 1116  BP: 122/64  Pulse: 68  Temp: 98.2 F (36.8 C)  TempSrc: Oral  SpO2: 98%  Weight: 154 lb (69.9 kg)  Height: 4' 11 (1.499 m)    Estimated body mass index is 31.1 kg/m as calculated from the following:   Height as of this encounter: 4' 11 (1.499 m).   Weight as of this encounter: 154 lb (69.9 kg).  @WEIGHTCHANGE @  Filed Weights   12/07/24 1116  Weight: 154 lb (69.9 kg)     Physical Exam   General: No distress.  Cushingoid O2 at rest: yes Cane present: no Sitting in wheel chair: no Frail: no Obese: no Neuro: Alert and Oriented x 3. GCS 15. Speech normal Psych: Pleasant Resp:  Barrel Chest - no.  Wheeze -no, Crackles -lower lobe and right upper lobe, No overt respiratory distress CVS: Normal heart sounds. Murmurs - no Ext: Stigmata of Connective Tissue Disease - no HEENT: Normal upper airway. PEERL +. 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 oil pain and ? mold in house ILD (interstitial lung disease) (HCC) Chronic respiratory failure with hypoxia (HCC) High risk medication use Visit for monitoring Rituxan  therapy Current chronic use of systemic steroids  -Symptoms and exercise oxygen  worse even compared to September 2025.  This is indicating progressive disease. - CellCept  caused pancreatitis in September 2025 and is currently off - Since Jan 2026 on [facility] protocol research protocol and on inhaled treprostinil versus placebo = Shared decision making to hold off on new standard of care Nerandomilast because of ongoing diarrhea - Respect to decision to forego lung transplant as a care option  Plan -Continue inhaled treprostinil versus placebo since January 2026 on research protocol - Continue prednisone  at 35 mg/day [you are self titrating it down but understand that you might go to 40 mg/day based on your symptoms] - - Continue Rituxan  every 6 months [last dose May 25, 2024 and next dose would be in January 2026 and then February 2026] - -  Continue Bactrim  for PJP prophylaxis - will  blood work CBC chemistry and  liver function done at research - so no nned to get 12/07/2024 She just needs a D-dimer   Leukocytopsis   Plan  - 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  Currently on hold  Plan  - continue dupixent  - RESTRT - contiue humidified o2   Diarrhea due to drug metformin   - Holding off on Nerandomilast because of this at this point  Plan  -Take CAROB Flour for Diarrhea due to medication as follows Take 1 DESSERT spoon  size serving [approximately 7 g] before breakfast If still no response in 3 days then add another 7 g at dinner If still  no response in 3 days then make it to spoon servings at breakfast and 2 spoon servings at dinner and hold NOTE: Always MIX the CAROB FLOUR with WATER  or MILK or JUICE - MIGHT NEED A BLENDER to do it DO NOT EAT CAROB POWER DIRECTLY - it can choke or make you cough Thank you    Hemoptysis new onset x 2 in January 2026 - Probably due to combination of inhaled treprostinil/placebo and Eliquis    Plan  - See pulmonary embolism section below - Monitor - check d-dimer  History of pulmonary embolism- small July 2025  -Concerns of easy bruising  Plan  - continue eliquis  but check D-dimer 12/07/2024 based on results we can reduce to low-dose  -  if bleediong severe have to stop or lower dose   Easy bruising  -Probably due to steroids and Eliquis .  Plan - Based on D-dimer we are going to reduce the Eliquis  dose  Steroid INduced High sugar  Plan  - per Dr Tawni Hidalgo   Mobility due to hypoxemia  Plan  - motorized wheel chair  Drooling 0 new x 1 months  ? Cause  Plan  - per PCP  Goals of care  - Life expectancy is limited probably few years  because of the advanced ILD and also immunosuppression  Plan  - Agree quality of life is the most important issue. -We are also trying to prevent the fibrosis from getting worse in order to increase life expectancy. - Avoid respiratory illness sick exposure and control your risk for respiratory infection  - be uptodate with all respiratory vaccines  - avoid sick contacts especially in areas of indoor clusters (churches, weddings, funerals, family gatherings, birthdays, planes, malls, indoor areas especially)   - mask in these areas   - discourage sick people from coming in close contact with you   [place]up - Continue with research visits - 8 weeks 30 minutes standard of care visit with Dr. Geronimo BOSCH    Return for - 8 weeks 30 minutes standard of care visit with Dr. Geronimo.  ( Level 05 visit E&M 2024:  Estb >= 40 min visit type: on-site physical face to visit  in total care time and counseling or/and coordination of care by this undersigned MD - Dr Dorethia Geronimo. This includes one or more of the following on this same day 12/07/2024: pre-charting, chart review, note writing, documentation discussion of test results, diagnostic or treatment recommendations, prognosis, risks and benefits of management options, instructions, education, compliance or risk-factor reduction. It excludes time spent by the CMA or office staff in the care of the patient. Actual time 55 min)   SIGNATURE    Dr. Dorethia Geronimo, M.D., F.C.C.P,  Pulmonary and Critical Care Medicine Staff Physician, [facility] Center Director - Interstitial Lung Disease  Program  Pulmonary Fibrosis [facility] at [facility] [place], [place], [address]  Pager: [phone], If no answer or between  15:00h - 7:00h: call [phone] Telephone: [phone]  5:47 PM 12/07/2024     [1]  Allergies Allergen Reactions   Ozempic (0.25 Or 0.5 Mg-Dose) [Semaglutide(0.25 Or 0.5mg -Dos)] Other (See Comments)    Paralyzed the stomach   Tape Other (See Comments)    SKIN IS VERY FRAGILE!! Burns and pulls up the skin!! SKIN BRUISES and TEARS EASILY.SABRA   Atorvastatin Other (See Comments)    Leg pain   Betadine [Povidone Iodine] Other (See Comments)    Blisters    Codeine Nausea And Vomiting   Garlic Diarrhea   Hydrocodone  Nausea And Vomiting   Iodine Other (See Comments)    Blisters    Macrobid [Nitrofurantoin] Other (See Comments)    Instructed by dr not to take    Ofev  [Nintedanib] Other (See Comments)    Abdominal pain   Onion Diarrhea   Rosuvastatin Other (See Comments)    Leg pain   Shellfish Allergy Nausea And Vomiting   Statins Other (See Comments)    Leg pain, but Pravachol  is tolerated   Sulfa  Antibiotics Other (See Comments)    Headaches   [2]  Current Outpatient Medications:     Acetaminophen  (TYLENOL  EXTRA STRENGTH PO), Take by mouth as needed., Disp: , Rfl:    Alpha-D-Galactosidase (BEANO PO), Take 1 tablet by mouth as needed (as directed, if eating onion or garlic)., Disp: , Rfl:    ALPRAZolam  (XANAX ) 0.25 MG tablet, Take 0.25 mg by mouth at bedtime as needed for anxiety or sleep., Disp: , Rfl:    apixaban  (ELIQUIS ) 5 MG TABS tablet, Take 1 tablet (5 mg total) by mouth 2 (two) times daily., Disp: 60 tablet, Rfl: 5   ASHWAGANDHA PO, Take 1 capsule by mouth every other  day., Disp: , Rfl:    azelastine (ASTELIN) 0.1 % nasal spray, Place 1 spray into both nostrils in the morning. Use in each nostril as directed, Disp: , Rfl:    BREO ELLIPTA  100-25 MCG/ACT AEPB, Inhale 1 puff into the lungs daily., Disp: , Rfl:    Calcium  Carb-Cholecalciferol (CALCIUM  500 + D PO), Take 1 tablet by mouth daily., Disp: , Rfl:    chlorpheniramine (CHLOR-TRIMETON) 4 MG tablet, Take 2 mg by mouth 2 (two) times daily as needed for allergies or rhinitis., Disp: , Rfl:    cholecalciferol (VITAMIN D3) 25 MCG (1000 UNIT) tablet, Take 1,000 Units by mouth every other day., Disp: , Rfl:    Continuous Glucose Sensor (DEXCOM G7 SENSOR) MISC, Check blood sugar fasting and before meals and at bedtime., Disp: 4 each, Rfl: 0   cycloSPORINE (RESTASIS) 0.05 % ophthalmic emulsion, Place 1 drop into both eyes 2 (two) times daily., Disp: , Rfl:    diltiazem  (CARDIZEM  CD) 120 MG 24 hr capsule, Take 1 capsule (120 mg total) by mouth daily., Disp: 90 capsule, Rfl: 3   EPINEPHrine  0.3 mg/0.3 mL IJ SOAJ injection, Inject 0.3 mg into the muscle as needed for anaphylaxis., Disp: , Rfl:    fluticasone  (FLONASE ) 50 MCG/ACT nasal spray, Spray 2 sprays into each nostril every day., Disp: 48 g, Rfl: 12   Glucagon  (BAQSIMI  ONE PACK) 3 MG/DOSE POWD, Place 1 Device into the nose as needed (Low blood sugar with impaired consciousness)., Disp: 2 each, Rfl: 3   Hypromellose (NATURES TEARS OP), Place 1 drop into both eyes in the  morning and at bedtime., Disp: , Rfl:    insulin  aspart (FIASP  FLEXTOUCH) 100 UNIT/ML FlexTouch Pen, Inject 8-17 Units into the skin 3 (three) times daily before meals., Disp: 30 mL, Rfl: 3   Insulin  NPH, Human,, Isophane, (HUMULIN  N KWIKPEN) 100 UNIT/ML Kiwkpen, Inject 24 Units into the skin daily before breakfast., Disp: 15 mL, Rfl: 3   levocetirizine (XYZAL ) 5 MG tablet, Take 5 mg by mouth at bedtime., Disp: , Rfl:    Loperamide HCl (IMODIUM PO), Take 0.5 tablets by mouth daily., Disp: , Rfl:    metoprolol  succinate (TOPROL -XL) 25 MG 24 hr tablet, Take 1 tablet (25 mg total) by mouth daily., Disp: 90 tablet, Rfl: 3   montelukast  (SINGULAIR ) 10 MG tablet, TAKE 1 TABLET BY MOUTH EVERYDAY AT BEDTIME, Disp: 90 tablet, Rfl: 2   Multiple Vitamin (MULTIVITAMIN) tablet, Take 1 tablet by mouth daily with breakfast., Disp: , Rfl:    nitroGLYCERIN  (NITROGLYN) 2 % ointment, Apply 0.25 inches topically 2 (two) times daily. Apply To anus and surrounding tissue. Use pea size amount, Disp: 30 g, Rfl: 0   nitroGLYCERIN  (NITROSTAT ) 0.4 MG SL tablet, Place 1 tablet (0.4 mg total) under the tongue every 5 (five) minutes as needed for chest pain., Disp: 25 tablet, Rfl: 10   nystatin  (MYCOSTATIN ) 100000 UNIT/ML suspension, Take 5 mLs by mouth 4 (four) times daily., Disp: , Rfl:    nystatin  cream (MYCOSTATIN ), Apply 1 Application topically 2 (two) times daily as needed (for irritation- affected sites)., Disp: , Rfl:    omeprazole  (PRILOSEC  OTC) 20 MG tablet, Take 10 mg by mouth daily. (Patient taking differently: Take 20 mg by mouth daily.), Disp: , Rfl:    OXYGEN , Inhale 4-5 L/min into the lungs See admin instructions. Inhale 4-5 L/min at bedtime and as needed for shortness of breath during the day, Disp: , Rfl:    pravastatin  (PRAVACHOL ) 40 MG tablet,  Take 1 tablet (40 mg total) by mouth every evening., Disp: 90 tablet, Rfl: 1   predniSONE  (DELTASONE ) 10 MG tablet, 40 mg daily, Disp: 100 tablet, Rfl: 1   PREPARATION H  0.25-50 % GEL, Place 1 application  rectally 2 (two) times daily as needed (for hemorrhoids)., Disp: , Rfl:    riTUXimab -pvvr (RUXIENCE  IV), Inject into the vein. Infuse 1000mg  at Day 0 and Day 14. Repeat cycle every 6 months, Disp: , Rfl:    sodium chloride  (OCEAN) 0.65 % SOLN nasal spray, Place 1 spray into both nostrils as needed for congestion., Disp: , Rfl:    sulfamethoxazole -trimethoprim  (BACTRIM  DS) 800-160 MG tablet, Take 1 tablet by mouth every Monday, Wednesday, and Friday., Disp: 12 tablet, Rfl: 2   albuterol  (PROVENTIL ) (2.5 MG/3ML) 0.083% nebulizer solution, Take 3 mLs (2.5 mg total) by nebulization every 6 (six) hours as needed for wheezing or shortness of breath. (Patient not taking: Reported on 12/07/2024), Disp: 360 mL, Rfl: 0   albuterol  (VENTOLIN  HFA) 108 (90 Base) MCG/ACT inhaler, Inhale 2 puffs into the lungs every 6 (six) hours as needed for wheezing or shortness of breath. (Patient not taking: Reported on 12/07/2024), Disp: 17 each, Rfl: 6   ascorbic acid (VITAMIN C) 500 MG tablet, Take 500 mg by mouth daily. (Patient not taking: Reported on 12/07/2024), Disp: , Rfl:    Dupilumab  (DUPIXENT ) 300 MG/2ML SOAJ, Inject 300 mg into the skin every 14 (fourteen) days. (Patient not taking: Reported on 12/07/2024), Disp: 4 mL, Rfl: 2   ezetimibe  (ZETIA ) 10 MG tablet, Take 1 tablet (10 mg total) by mouth daily. (Patient not taking: Reported on 12/07/2024), Disp: 90 tablet, Rfl: 3   furosemide  (LASIX ) 20 MG tablet, Take 1 tablet (20 mg total) by mouth daily. Use as needed for edema per office instructions (Patient not taking: Reported on 12/07/2024), Disp: 30 tablet, Rfl: 0   metFORMIN  (GLUCOPHAGE -XR) 500 MG 24 hr tablet, Take 1 tablet (500 mg total) by mouth 2 (two) times daily with a meal. (Patient not taking: Reported on 12/07/2024), Disp: 180 tablet, Rfl: 3   revefenacin  (YUPELRI ) 175 MCG/3ML nebulizer solution, Inhale 1 vial by nebulization daily. (Patient not taking: Reported on 12/07/2024),  Disp: 90 mL, Rfl: 5   Sodium Chloride , Inhalant, 7 % NEBU, Use per nebulizer once a day to help clear sputum (Patient not taking: Reported on 12/07/2024), Disp: 120 mL, Rfl: 3  "

## 2024-12-07 NOTE — Telephone Encounter (Signed)
 Returned call to get more information. Left message to call back. Will go ahead and route to provider and covering.

## 2024-12-08 ENCOUNTER — Telehealth: Payer: Self-pay | Admitting: Internal Medicine

## 2024-12-08 DIAGNOSIS — Z86711 Personal history of pulmonary embolism: Secondary | ICD-10-CM

## 2024-12-08 LAB — D-DIMER, QUANTITATIVE: D-Dimer, Quant: <0.19 ug{FEU}/mL

## 2024-12-08 MED ORDER — APIXABAN 2.5 MG PO TABS: 2.5000 mg | ORAL_TABLET | Freq: Two times a day (BID) | ORAL | 3 refills | Status: AC

## 2024-12-08 MED ORDER — DUPIXENT 300 MG/2ML ~~LOC~~ SOAJ: 300.0000 mg | SUBCUTANEOUS | 4 refills | Status: AC

## 2024-12-08 NOTE — Telephone Encounter (Signed)
 Left voice message with Dr. Denver suggestions per [facility]. Pt told to call with any questions.

## 2024-12-08 NOTE — Telephone Encounter (Signed)
 Last OV with Dr. Geronimo was 12/07/24. Plan to restart Dupixent . Called patient to discuss as this will mean using a loading dose for first dose when she restarts. She has two pens at home which she plans to use on 12/19/24 to restart Dupixent  to avoid the same week she has Ruxience  infusion.   She will need medication for the following dose which would be due 01/02/25.  Per test claim, copay for 28 day supply of Dupixent  is $100.   Attempted to enroll in [employer] asthma grant, but income limit exceeded.   Patient states she will pay the $100 copay.   Rx triaged to [facility] - will need shipment before 01/02/25.

## 2024-12-08 NOTE — Telephone Encounter (Signed)
" °  D-dimer normal now  Plan  -reduce eliquis  to 2.5mg  twice daily - I sent to [place] - stop eliquis  at 5mg  twice daily   Pls let her know  SIGNATURE    Dr. Dorethia Cave, M.D., F.C.C.P,  Pulmonary and Critical Care Medicine Staff Physician, [facility] Center Director - Interstitial Lung Disease  Program  Pulmonary Fibrosis [facility] at [facility] [place], [place], [address]   Pager: [phone], If no answer  -> Check AMION or Try [phone] Telephone (clinical office): [phone] Telephone (research): [phone]  2:45 PM 12/08/2024    Latest Reference Range & Units 10/27/22 16:24 03/06/24 09:34 03/14/24 16:57 05/22/24 13:02 12/07/24 12:11  D-Dimer, Quant <0.50 mcg/mL FEU 0.39 0.41 <0.27 0.58 (H) <0.19  (H): Data is abnormally high "

## 2024-12-08 NOTE — Addendum Note (Signed)
 Addended by: Davius Goudeau L on: 12/08/2024 10:56 AM   Modules accepted: Orders

## 2024-12-11 ENCOUNTER — Telehealth: Payer: Self-pay | Admitting: *Deleted

## 2024-12-11 NOTE — Telephone Encounter (Signed)
 I called and spoke with patient, advised her of the results/recommendations per Dr. Geronimo.  She stated that [facility] did not have the 2.5 mg of Eliquis , however, they will have it on 12/12/2024.  She said she had been cutting it in half and asked it that was ok.  I advised her that is not recommended.  She said it was not scored.  I told her that when it is not scored, it is not recommended to be cut in half.  I asked if she could get a short supply from another pharmacy until [facility] gets her supply in on Tuesday (12/12/24), she said she would just wait until tomorrow, it is not that long.  Nothing further needed.

## 2024-12-11 NOTE — Telephone Encounter (Signed)
 While I was on the phone with patient, she stated she had been discussing restarting her Dupixent  with the pharmacist, however could not remember.  I looked in the chart and it looked like Aleck was working on her Dupixent .  I let her know it appeared that she was not available at this time, however I would send her a message to call her back when she gets a chance.  She said she comes in for her infusion on 12/18/24.  Aleck, Could you please call patient regarding her Dupixent  when you get a chance? Thank you.

## 2024-12-12 ENCOUNTER — Other Ambulatory Visit (HOSPITAL_COMMUNITY): Payer: Self-pay

## 2024-12-12 NOTE — Telephone Encounter (Signed)
 Contacted patient to discuss her question -  She asks about when to restart Dupixent . She does not want to use Dupixent  the same week as her Ruxience  infusion. Infusion scheduled 12/18/24.   We discussed she could restart Dupixent  today, but she declines as she prefers to let Dupixent  stay at room temperature for 1-2 days before injection, within appropriate storage requirements.   Ultimately, she would like to restart Dupixent  on Tuesday, 12/26/24. She has Dupixent  pens at home to restart Dupixent  on 12/26/24 and the following doses will be shipped by [facility]. She feels good about this plan.  NFN.

## 2024-12-17 ENCOUNTER — Telehealth: Payer: Self-pay | Admitting: Internal Medicine

## 2024-12-17 DIAGNOSIS — J849 Interstitial pulmonary disease, unspecified: Secondary | ICD-10-CM

## 2024-12-17 DIAGNOSIS — Z006 Encounter for examination for normal comparison and control in clinical research program: Secondary | ICD-10-CM

## 2024-12-17 NOTE — Telephone Encounter (Signed)
 Title: A Randomized, Double-blind, Placebo-controlled, Multinational, Phase 3 Study of the Efficacy and Safety of Inhaled Treprostinil in Subjects with Progressive Pulmonary Fibrosis   Dose and Duration of Treatment: Treprostinil for Inhalation 0.6 mg/mL, or placebo (randomly assigned 1:1) over a 52 week period.  Protocol # [id number]   On December 15, 2024, the participant [id number] sent a text message to [facility] [place] reporting the following:  Hi. Now that I'm on 8 breaths each dose, I'm having a good bit of burning in the back of my throat. I can't use honey due to diabetes. Yogurt or peanut butter I try, but last night I had burning heartburn in the evening at bedtime. CT showed thickening of the wall of the esophagus before we started. I have had major reflux from time to time. Is there any way I can titrate up a lot slower? I did not notice these problems until I went past 5 or 6.  As soon as CRC swa the text, CRC immediately contacted the patient and informed them that PI Dr Geronimo would be notified right away and that they would receive a follow-up call.  CRC contacted the PI at 6:18 PM the same day. Per PIs instructions, the patient was advised to reduce the dose to 6 breaths, and if symptoms did not improve, to further reduce to 5 breaths.  During the follow-up call, the patient also reported worsening diarrhea. The patient stated they were unsure whether the diarrhea was related to the study drug, as they have experienced this issue chronically. PI had advised the patient to take carob flour during Standard of caro on 06 Dec 2024.  Additionally, PI instructed CRC to advise the patient to stay warm and ensure their oxygen  machine remains fully charged due to an expected winter storm in [place]  over the upcoming weekend.  CRC instructed the participant to call them incase any problem and else they will get back to them in the next [facility] call scheduled next week 20 Dec 2024.       Signed by  Mansfield Pottier  Clinical Research Coordinator  [facility]  [place], [place] Signature Time/Date: 12:54 PM 12/17/2024

## 2024-12-18 ENCOUNTER — Other Ambulatory Visit (HOSPITAL_COMMUNITY): Payer: Self-pay

## 2024-12-18 ENCOUNTER — Ambulatory Visit

## 2024-12-20 ENCOUNTER — Ambulatory Visit

## 2024-12-20 VITALS — BP 134/66 | HR 70 | Temp 97.8°F | Resp 24 | Ht 59.0 in | Wt 154.6 lb

## 2024-12-20 DIAGNOSIS — I272 Pulmonary hypertension, unspecified: Secondary | ICD-10-CM | POA: Diagnosis not present

## 2024-12-20 DIAGNOSIS — Z79899 Other long term (current) drug therapy: Secondary | ICD-10-CM

## 2024-12-20 DIAGNOSIS — J679 Hypersensitivity pneumonitis due to unspecified organic dust: Secondary | ICD-10-CM

## 2024-12-20 DIAGNOSIS — J849 Interstitial pulmonary disease, unspecified: Secondary | ICD-10-CM

## 2024-12-20 MED ORDER — DIPHENHYDRAMINE HCL 25 MG PO CAPS
50.0000 mg | ORAL_CAPSULE | Freq: Once | ORAL | Status: AC
  Administered 2024-12-20: 50 mg via ORAL
  Filled 2024-12-20: qty 2

## 2024-12-20 MED ORDER — ACETAMINOPHEN 325 MG PO TABS
650.0000 mg | ORAL_TABLET | Freq: Once | ORAL | Status: AC
  Administered 2024-12-20: 650 mg via ORAL
  Filled 2024-12-20: qty 2

## 2024-12-20 MED ORDER — SODIUM CHLORIDE 0.9 % IV SOLN
1000.0000 mg | Freq: Once | INTRAVENOUS | Status: AC
  Administered 2024-12-20: 1000 mg via INTRAVENOUS
  Filled 2024-12-20: qty 100

## 2024-12-20 MED ORDER — METHYLPREDNISOLONE SODIUM SUCC 125 MG IJ SOLR
125.0000 mg | Freq: Once | INTRAMUSCULAR | Status: AC
  Administered 2024-12-20: 125 mg via INTRAVENOUS
  Filled 2024-12-20: qty 2

## 2024-12-20 NOTE — Progress Notes (Signed)
 Diagnosis: Interstitial lung disease  Provider:  Mannam, Praveen MD  Procedure: IV Infusion  IV Type: Peripheral, IV Location: L Antecubital  Ruxience  (rituximab -pvvr), Dose: 1000 mg  Infusion Start Time: 0912  Infusion Stop Time: 1245  Post Infusion IV Care: Peripheral IV Discontinued  Discharge: Condition: Good, Destination: Home . AVS Declined  Performed by:  Rocky FORBES Search, RN

## 2024-12-21 ENCOUNTER — Telehealth: Payer: Self-pay

## 2024-12-21 NOTE — Telephone Encounter (Signed)
 Pt called stating that she has been having some high blood sugars in the 200s  and above after having her IV staeriod treatment yesterday, with her NPH: she took 29 units and with the Humalog /Fiasp  she took 14 units for breakfast and then 17 for lunch. She has been off the metformin  because she thought the diarrhea was from that and its not so she wants to know can she start back on that or what else do you advise

## 2024-12-21 NOTE — Telephone Encounter (Signed)
 I called and spoke with the patient she has been informed and voices understanding. I also sent this to her on Mychart as well for her top reference back to.

## 2024-12-22 NOTE — Telephone Encounter (Signed)
 PI OVERSIGHT ATTESTATION  I the Principal Investigator (PI) for the above mentioned study attest that I reviewed the mentioned  clinical research coordinator  notes on research subject  Rhonda [name]  born 1951-10-29 . I  agree with the findings mentioned above  Diarrhea improve now. Discussed with cRc back then and .12/22/2024   Dr.Zacharee Gaddie Geronimo, MD Pulmonary and Critical Care Medicine Research Investigator & Staff Physician [facility]  [place] [facility] Pager: [phone], If no answer or between  15:00h - 7:00h: call [phone]  12/22/2024 3:19 PM

## 2024-12-27 ENCOUNTER — Encounter: Admitting: Adult Health

## 2024-12-27 DIAGNOSIS — J849 Interstitial pulmonary disease, unspecified: Secondary | ICD-10-CM

## 2024-12-27 DIAGNOSIS — Z006 Encounter for examination for normal comparison and control in clinical research program: Secondary | ICD-10-CM

## 2024-12-27 NOTE — Research (Cosign Needed)
 Title: A Randomized, Double-blind, Placebo-controlled, Multinational, Phase 3 Study of the Efficacy and Safety of Inhaled Treprostinil in Subjects with Progressive Pulmonary Fibrosis   Dose and Duration of Treatment: Treprostinil for Inhalation 0.6 mg/mL, or placebo (randomly assigned 1:1) over a 52 week period.  Protocol # RIN-PF-305  Sponsor: [employer] [place], [place] [address]  Protocol Version as of 12/27/2024 is  Amendment 2 Dated 14Oct2025 IB: Version 20 dated 11Jul2025 ICF: Make sure that the subject is re-consented with the updated ICF Main ICF: V3.0_13Nov2025 IRB APPROVED AS MODIFIED 02Feb2026 Lab Manual: Version 1 dated 08Sep2023Investigator Brochure Product:  Treprostinil for Inhalation, 0.6 mg/mL   [facility] [place] Clinical Research Coordinator note:   This visit for Subject Rhonda [name] with DOB: [birth year] on 12/27/2024 for the above protocol is Visit/Encounter # Week 4  and is for purpose of research.  THe Subject ID for the study is [phone]  The consent for this encounter is under Protocol Version Amendment 2 Dated 14Oct2025 IB: Version 20 dated 11Jul2025 ICF: Make sure that the subject is re-consented with the updated ICF Main ICF: V3.0_13Nov2025 IRB APPROVED AS MODIFIED 02Feb2026 Lab Manual: Version 1 dated 08Sep2023 IS currently IRB approved.   Rhonda [name]  (Subject/LAR) expressed continued interest and consent in continuing as a study subject. Subject confirmed that there was  No  (NO/YES) change in contact information (e.g. address, telephone, email). Subject thanked for participation in research and contribution to science.  Patient was reconsented and Sub-I Tammy Parrett,NPwas there during reconsenting.  All assessments were done as per the protocol. Further details in the binder. The Subject was informed that the PI Dorethia Cave continues to have oversight of the subject's visits and course through relevant discussions,  reviews, and also specifically of this visit by routing of this note to the PI.    Signed by  Mansfield Pottier  Clinical Research Coordinator  [facility]  [place], [place] Signature Time/Date: 10:06 AM 12/27/2024

## 2024-12-27 NOTE — Progress Notes (Signed)
 74 year old female followed for interstitial lung disease and chronic respiratory failure  Today's research visit is for Re-consent for study  Title: A Randomized, Double-blind, Placebo-controlled, Multinational, Phase 3 Study of the Efficacy and Safety of Inhaled Treprostinil in Subjects with Progressive Pulmonary Fibrosis   Dose and Duration of Treatment: Treprostinil for Inhalation 0.6 mg/mL, or placebo (randomly assigned 1:1) over a 52 week period.  Protocol # RIN-PF-305  Sponsor: [employer] [place], [place] [address]   Investigator Brochure Product:  Treprostinil for Inhalation, 0.6 mg/mL  Key Features of the Drug: Treprostinil (LRX-15, 15AU81, UT-15) is a stable tricyclic benzindene analogue of the naturally occurring prostacyclin. Prostacyclin is known to lower pulmonary artery pressure, increase cardiac output without affecting heart rate, improve oxygen  transport, and potentially reverse pulmonary arterial remodeling. Treprostinil is a full agonist at the prostacyclin receptor (IP), leading to potent activation of the prostaglandin E receptor 2 (EP2) and D receptor 1. Activation of the EP2 receptor inhibits mast cell-mediated bronchoconstriction and has a range of inhibitory effects on fibroblast function that may be beneficial to IPF patients ([place] 2015, [place] 1995). In addition, activation of prostaglandin D receptor 1 has shown to decrease both inflammatory cell recruitment and collagen accumulation in the lung [place] 2010).   An inhalation solution of treprostinil, 0.6 mg/mL, delivered using an ultrasonic nebulizer, is currently FDA approved to treat pulmonary arterial hypertension (PAH); [WHO Group 1, PH-ILD; WHO Group 3]. Inhaled treprostinil is now being evaluated as primary treatment of IPF, idiopathic pulmonary fibrosis.   Key Inclusion Criteria: >= 61 years of age Radiologic evidence of pulmonary fibrosis of > 10 % on HRCT Subject has a  diagnosis of progressive pulmonary fibrosis (other than IPF) that fulfills at least 1 of the following criteria for progression within 24 months of screening despite standard treatment of ILD, as assessed by the Investigator:  Clinically significant decline in % predicted FVC based on >=10% relative decline  Marginal decline in % predicted FVC based on >=5% to <10% relative decline combined with worsening of respiratory symptoms Marginal decline in % predicted FVC based on >=5% to <10% relative decline combined with increasing extent of fibrotic changes on chest imaging Worsening of respiratory symptoms as well as increasing extent of fibrotic changes on chest imaging Subjects on pirfenidone  or nintedanib must be on a stable and optimized dose for >=90 days prior to Baseline. Concomitant use of both pirfenidone  and nintedanib is not permitted. Subjects treated with immunosuppressive agents (eg, mycophenolate , methotrexate, azathioprine, oral corticosteroids, rituximab ) need to be on treatment for at least 120 days prior to Baseline and, in the Investigators clinical opinion, must be refractory to treatment  Key Exclusion Criteria: Primary obstructive pathology (FEV1/FVC <0.70 at Screening) Prior intolerance or significant lack of efficacy to a prostacyclin or prostacyclin analogue that resulted in discontinuation or inability to effectively titrate that therapy.  Subject has diagnosis of IPF Subject has received any PAH-approved therapy. As needed use of a PDE5-I for erectile dysfunction is permitted, provided no doses are taken within 48 hours prior to any study-related efficacy assessments Receiving >10 L/min of oxygen  at rest at Baseline.  Exacerbation of ILD or active pulmonary or upper respiratory infection within 30 days prior to Baseline.  Uncontrolled cardiac disease, defined as myocardial infarction within 6 months prior to Baseline or unstable angina within 30 days prior to Baseline. Use  of any investigational drug/device or participation in any investigational study in which the subject received a medical intervention (ie, procedure, device,  medication/supplement) within 30 days prior to Screening. Subjects participating in non-interventional, observational, or registry studies are eligible.  Life expectancy <12 months due to ILD or a concomitant illness.  Acute pulmonary embolism within 90 days prior to Baseline.   Pharmacodynamics/Pharmacokinetics:  Metabolism: Substantially metabolized by the liver, primarily CYP2C8. Of [place] administered treprostinil, only 4% is excreted unchanged in urine.  Protein Binding: 91% bound to human plasma proteins Elimination: Biphasic elimination with [place] administration with terminal elimination t1/2 of approximately 4 hours  Contraindications: None  Special Warnings/Precautions:  Due to substantial hepatic metabolism, liver impairment could result in decreased metabolism and increased systemic exposure to treprostinil.  Abrupt discontinuation or marked dose reduction can lead to sudden worsening of pulmonary arterial hypertension there is no evidence to suggest patients taking inhaled therapy are at risk for withdrawal symptoms or rebound effects.  Angioedema was identified in the post-approval use of inhaled treprostinil solution. As a result of the route of administration, acute contact dermatitis mimicking angioedema may be more likely associated with administration of inhaled treprostinil solution Treprostinil inhibits platelet aggregation and increases the risk of bleeding,  Symptomatic hypotension due to systemic and pulmonary vasodilator effects of Treprostinil may occur Single doses of Tyvaso 54 mcg and 84 mcg prolonged the Qtc interval by approximately 10 ms. The QTc effect decreased rapidly as concentration of treprostinil decreased.   Drug Interactions:   CYP2C8 is a major enzyme and CYP2C9 a minor enzyme in treprostinil metabolism in  humans.     Safety Data: The estimate of worldwide exposure to treprostinil in the marketed inhaled formulations is approximately 85,258,140 total patient treatment-days (approximately 40,389 patient treatment-years).  most relevant insight into the expected safety profile of inhaled treprostinil were [id number]-[id number] 001 and RIN-PH-201,  en. These studies suggest that the most common adverse effects of inhaled treprostinil are related to the pharmacologic actions of the drug and to the route of administration: headache (41% with active treatment vs 23% with placebo), nausea (19% with active treatment vs 11% with placebo), and flushing (15% with active treatment vs <1% with placebo) were frequently reported.  Signs and symptoms of overdose with inhaled treprostinil solution may correspond to dose-limiting pharmacologic effects, including diarrhea, flushing, headache, hypotension, nausea, and vomiting. Most events are self-limiting and resolve with reduction or withholding of inhaled treprostinil solution. Provide general supportive care until the symptoms of overdose have resolved.   Adverse Reactions: Adverse effects are listed below that occurred in at least 5-10% of inhaled treprostinil subjects in prior studies. Of note, SAE were more common in the placebo group than treprostinil group.   Cough Headache Nausea Dizziness Flushing Throat Irritation  Pharyngolaryngeal pain Diarrhea Chest pain Jaw pain Epistaxis Syncope   Serious adverse events observed post-marketing:  Vomiting Pain Pneumonia Syncope Hemoptysis Hypotension   This visit for Subject Rhonda [name] with DOB: 11/21/1951 on 12/27/2024 for the above protocol is   and is for purpose of research.  THe Subject ID for the study is for Re-Consent  .   12/27/2024  Research Visit  This visit for Subject Rhonda [name] with DOB: [birth year] on 12/27/2024 for the above protocol is  for purpose of re-consent . Subject/LAR  expressed continued interest and consent in continuing as a study subject. Subject thanked for participation in research and contribution to science.   Continue with research protocol.   Madelin Stank NP  [facility] Sub-Investigator

## 2024-12-28 ENCOUNTER — Other Ambulatory Visit (HOSPITAL_COMMUNITY): Payer: Self-pay

## 2024-12-29 ENCOUNTER — Other Ambulatory Visit: Payer: Self-pay

## 2025-01-01 ENCOUNTER — Ambulatory Visit

## 2025-01-19 ENCOUNTER — Ambulatory Visit: Admitting: Cardiology

## 2025-01-23 ENCOUNTER — Encounter

## 2025-01-29 ENCOUNTER — Ambulatory Visit: Admitting: Internal Medicine

## 2025-02-01 ENCOUNTER — Ambulatory Visit: Admitting: Internal Medicine
